# Patient Record
Sex: Female | Born: 1965 | Race: White | Hispanic: No | Marital: Single | Smoking: Never smoker
Health system: Southern US, Community
[De-identification: ages and names within clinical notes are randomized; demographics above are authoritative.]

## PROBLEM LIST (undated history)

## (undated) DIAGNOSIS — E049 Nontoxic goiter, unspecified: Secondary | ICD-10-CM

## (undated) DIAGNOSIS — Z87442 Personal history of urinary calculi: Secondary | ICD-10-CM

## (undated) DIAGNOSIS — M109 Gout, unspecified: Secondary | ICD-10-CM

## (undated) DIAGNOSIS — C50919 Malignant neoplasm of unspecified site of unspecified female breast: Secondary | ICD-10-CM

## (undated) DIAGNOSIS — I5030 Unspecified diastolic (congestive) heart failure: Secondary | ICD-10-CM

## (undated) DIAGNOSIS — I10 Essential (primary) hypertension: Secondary | ICD-10-CM

## (undated) DIAGNOSIS — B029 Zoster without complications: Secondary | ICD-10-CM

## (undated) DIAGNOSIS — D649 Anemia, unspecified: Secondary | ICD-10-CM

## (undated) DIAGNOSIS — E119 Type 2 diabetes mellitus without complications: Secondary | ICD-10-CM

## (undated) DIAGNOSIS — K5790 Diverticulosis of intestine, part unspecified, without perforation or abscess without bleeding: Secondary | ICD-10-CM

## (undated) DIAGNOSIS — J449 Chronic obstructive pulmonary disease, unspecified: Secondary | ICD-10-CM

## (undated) DIAGNOSIS — D3502 Benign neoplasm of left adrenal gland: Secondary | ICD-10-CM

## (undated) DIAGNOSIS — N289 Disorder of kidney and ureter, unspecified: Secondary | ICD-10-CM

## (undated) DIAGNOSIS — N183 Chronic kidney disease, stage 3 unspecified: Secondary | ICD-10-CM

## (undated) DIAGNOSIS — I700 Atherosclerosis of aorta: Secondary | ICD-10-CM

## (undated) DIAGNOSIS — G4733 Obstructive sleep apnea (adult) (pediatric): Secondary | ICD-10-CM

## (undated) DIAGNOSIS — C50211 Malignant neoplasm of upper-inner quadrant of right female breast: Secondary | ICD-10-CM

## (undated) DIAGNOSIS — J984 Other disorders of lung: Secondary | ICD-10-CM

## (undated) DIAGNOSIS — Z923 Personal history of irradiation: Secondary | ICD-10-CM

## (undated) DIAGNOSIS — Z7901 Long term (current) use of anticoagulants: Secondary | ICD-10-CM

## (undated) DIAGNOSIS — E039 Hypothyroidism, unspecified: Secondary | ICD-10-CM

## (undated) DIAGNOSIS — I4819 Other persistent atrial fibrillation: Secondary | ICD-10-CM

## (undated) DIAGNOSIS — Z992 Dependence on renal dialysis: Secondary | ICD-10-CM

## (undated) DIAGNOSIS — M1990 Unspecified osteoarthritis, unspecified site: Secondary | ICD-10-CM

## (undated) DIAGNOSIS — G479 Sleep disorder, unspecified: Secondary | ICD-10-CM

## (undated) DIAGNOSIS — I509 Heart failure, unspecified: Secondary | ICD-10-CM

## (undated) DIAGNOSIS — I2699 Other pulmonary embolism without acute cor pulmonale: Secondary | ICD-10-CM

## (undated) DIAGNOSIS — I4821 Permanent atrial fibrillation: Secondary | ICD-10-CM

## (undated) DIAGNOSIS — N186 End stage renal disease: Secondary | ICD-10-CM

## (undated) DIAGNOSIS — Z780 Asymptomatic menopausal state: Secondary | ICD-10-CM

## (undated) HISTORY — DX: Hypothyroidism, unspecified: E03.9

## (undated) HISTORY — DX: Nontoxic goiter, unspecified: E04.9

## (undated) HISTORY — DX: Other persistent atrial fibrillation: I48.19

## (undated) HISTORY — DX: Disorder of kidney and ureter, unspecified: N28.9

## (undated) HISTORY — DX: Permanent atrial fibrillation: I48.21

## (undated) HISTORY — DX: Zoster without complications: B02.9

## (undated) HISTORY — DX: Anemia, unspecified: D64.9

## (undated) HISTORY — DX: Malignant neoplasm of upper-inner quadrant of right female breast: C50.211

## (undated) HISTORY — DX: Essential (primary) hypertension: I10

## (undated) HISTORY — DX: End stage renal disease: N18.6

## (undated) HISTORY — DX: Asymptomatic menopausal state: Z78.0

## (undated) HISTORY — DX: Gout, unspecified: M10.9

## (undated) HISTORY — DX: Chronic kidney disease, stage 3 unspecified: N18.30

## (undated) HISTORY — DX: Unspecified diastolic (congestive) heart failure: I50.30

## (undated) HISTORY — DX: Unspecified osteoarthritis, unspecified site: M19.90

## (undated) HISTORY — DX: Heart failure, unspecified: I50.9

## (undated) HISTORY — DX: Type 2 diabetes mellitus without complications: E11.9

## (undated) HISTORY — DX: Morbid (severe) obesity due to excess calories: E66.01

---
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
## 2004-11-20 ENCOUNTER — Emergency Department: Payer: Self-pay | Admitting: Unknown Physician Specialty

## 2008-06-21 ENCOUNTER — Emergency Department: Payer: Self-pay | Admitting: Emergency Medicine

## 2008-07-13 ENCOUNTER — Ambulatory Visit: Payer: Self-pay

## 2009-12-05 ENCOUNTER — Emergency Department: Payer: Self-pay | Admitting: Emergency Medicine

## 2012-02-14 DIAGNOSIS — C50919 Malignant neoplasm of unspecified site of unspecified female breast: Secondary | ICD-10-CM

## 2012-02-14 HISTORY — PX: BREAST SURGERY: SHX581

## 2012-02-14 HISTORY — PX: BREAST BIOPSY: SHX20

## 2012-02-14 HISTORY — DX: Malignant neoplasm of unspecified site of unspecified female breast: C50.919

## 2012-07-10 ENCOUNTER — Encounter: Payer: Self-pay | Admitting: General Surgery

## 2012-07-10 ENCOUNTER — Ambulatory Visit (INDEPENDENT_AMBULATORY_CARE_PROVIDER_SITE_OTHER): Payer: BC Managed Care – PPO | Admitting: General Surgery

## 2012-07-10 ENCOUNTER — Other Ambulatory Visit: Payer: Self-pay

## 2012-07-10 VITALS — BP 120/74 | HR 78 | Resp 14 | Ht 64.0 in | Wt 343.0 lb

## 2012-07-10 DIAGNOSIS — N6452 Nipple discharge: Secondary | ICD-10-CM

## 2012-07-10 DIAGNOSIS — N6459 Other signs and symptoms in breast: Secondary | ICD-10-CM

## 2012-07-10 NOTE — Progress Notes (Signed)
Patient ID: Nicole Schmidt, female   DOB: 08-03-1965, 47 y.o.   MRN: [id number]  Chief Complaint  Patient presents with  . Breast Problem    HPI Nicole Schmidt is a 47 y.o. female here today for her right bloody nipple discharge has been going on for three week. Never had any breast problems before. Patient had here first mammogram on 07/04/12. Patient has no family history of breast cancer. Patient dose perform self breast checks .Cytology was performed by Dr. Ronnald Collum and came back negative. HPI  Past Medical History  Diagnosis Date  . Arthritis   . Menopause     age 18  . Hypertension   . Gout   . Thyroid goiter     History reviewed. No pertinent past surgical history.  Family History  Problem Relation Age of Onset  . Diabetes Father   . Hypertension Father   . Hypertension Mother   . Rheum arthritis Mother   . Atrial fibrillation Mother   . Parkinson's disease Father     Social History History  Substance Use Topics  . Smoking status: Never Smoker   . Smokeless tobacco: Never Used  . Alcohol Use: No    No Known Allergies  Current Outpatient Prescriptions  Medication Sig Dispense Refill  . allopurinol (ZYLOPRIM) 300 MG tablet Take 1 tablet by mouth daily.      Marland Kitchen amLODipine (NORVASC) 10 MG tablet Take 1 tablet by mouth daily.      Francia Greaves THYROID 60 MG tablet Take 2 tablets by mouth daily.      . Calcium Carbonate-Vitamin D (CALCIUM 600 + D PO) Take 1 tablet by mouth daily.      . Chromium-Cinnamon [phone] MCG-MG CAPS Take 2 capsules by mouth daily.      . ergocalciferol (VITAMIN D2) 50000 UNITS capsule Take 50,000 Units by mouth once a week.      . folic acid (FOLVITE) Q000111Q MCG tablet Take 400 mcg by mouth daily.      . furosemide (LASIX) 20 MG tablet Take 1 tablet by mouth daily.      Marland Kitchen lisinopril-hydrochlorothiazide (PRINZIDE,ZESTORETIC) 20-25 MG per tablet Take 1 tablet by mouth daily.      . metFORMIN (GLUCOPHAGE) 1000 MG tablet Take 1 tablet by mouth 2 (two)  times daily.      Marland Kitchen VICTOZA 18 MG/3ML SOPN Inject 1.8 mg as directed daily.       No current facility-administered medications for this visit.    Review of Systems Review of Systems  Constitutional: Negative.   Respiratory: Negative.   Cardiovascular: Negative.     Blood pressure 120/74, pulse 78, resp. rate 14, height 5\' 4"  (1.626 m), weight 343 lb (155.584 kg), last menstrual period 02/13/2010.  Physical Exam Physical Exam  Constitutional: She is oriented to person, place, and time. She appears well-developed and well-nourished.  Eyes: Conjunctivae are normal. No scleral icterus.  Neck: Neck supple.  Cardiovascular: Normal rate, regular rhythm and normal heart sounds.   Pulmonary/Chest: Breath sounds normal. Right breast exhibits nipple discharge (bloody discharge ). Right breast exhibits no inverted nipple, no mass, no skin change and no tenderness. Left breast exhibits no inverted nipple, no mass, no nipple discharge, no skin change and no tenderness. Breasts are symmetrical.  Abdominal: Soft.  Lymphadenopathy:    She has no cervical adenopathy.    She has no axillary adenopathy.  Neurological: She is alert and oriented to person, place, and time.  Skin: Skin is warm and dry.  Drainage is coming from the upper inter quadrant  medially   Data Reviewed Mammogram and u/s  from BI reviewed. U/s performed here shows multiple ducts from 12-3 o'clock in right subareolar region.Pressure over this produces discharge.  Assessment   bloody discharge right nipple     Plan    Best option is subareolar excision.    This patient's surgery has been scheduled for 07-19-12 at [facility].   Angellee Cohill G 07/10/2012, 11:35 AM

## 2012-07-10 NOTE — Patient Instructions (Addendum)
Patient to have a right breast subareolar duct excision. This was explained in full to patient.  This patient's surgery has been scheduled for 07-19-12 at [facility].

## 2012-07-19 ENCOUNTER — Ambulatory Visit: Payer: Self-pay | Admitting: General Surgery

## 2012-07-19 DIAGNOSIS — N6459 Other signs and symptoms in breast: Secondary | ICD-10-CM

## 2012-07-19 HISTORY — PX: BREAST EXCISIONAL BIOPSY: SUR124

## 2012-07-23 ENCOUNTER — Encounter: Payer: Self-pay | Admitting: General Surgery

## 2012-07-24 ENCOUNTER — Encounter: Payer: Self-pay | Admitting: General Surgery

## 2012-07-24 ENCOUNTER — Ambulatory Visit (INDEPENDENT_AMBULATORY_CARE_PROVIDER_SITE_OTHER): Payer: BC Managed Care – PPO | Admitting: General Surgery

## 2012-07-24 VITALS — BP 124/82 | HR 92 | Resp 18 | Ht 64.0 in | Wt 338.0 lb

## 2012-07-24 DIAGNOSIS — C50919 Malignant neoplasm of unspecified site of unspecified female breast: Secondary | ICD-10-CM

## 2012-07-24 DIAGNOSIS — C50911 Malignant neoplasm of unspecified site of right female breast: Secondary | ICD-10-CM | POA: Insufficient documentation

## 2012-07-24 NOTE — Patient Instructions (Addendum)
Patient to have a bilateral breast MRI. This is to be arranged.

## 2012-07-24 NOTE — Progress Notes (Signed)
Patient ID: Nicole Schmidt, female   DOB: [birth year], 47 y.o.   MRN: [id number]  Chief Complaint  Patient presents with  . Other    HPI Nicole Schmidt is a 47 y.o. female.  Patient here for discussion of right breast biopsy results done 06-18-12. HPI  Past Medical History  Diagnosis Date  . Arthritis   . Menopause     age 81  . Hypertension   . Gout   . Thyroid goiter     No past surgical history on file.  Family History  Problem Relation Age of Onset  . Diabetes Father   . Hypertension Father   . Hypertension Mother   . Rheum arthritis Mother   . Atrial fibrillation Mother   . Parkinson's disease Father     Social History History  Substance Use Topics  . Smoking status: Never Smoker   . Smokeless tobacco: Never Used  . Alcohol Use: No    No Known Allergies  Current Outpatient Prescriptions  Medication Sig Dispense Refill  . allopurinol (ZYLOPRIM) 300 MG tablet Take 1 tablet by mouth daily.      Marland Kitchen amLODipine (NORVASC) 10 MG tablet Take 1 tablet by mouth daily.      Francia Greaves THYROID 60 MG tablet Take 2 tablets by mouth daily.      . Calcium Carbonate-Vitamin D (CALCIUM 600 + D PO) Take 1 tablet by mouth daily.      . Chromium-Cinnamon [phone] MCG-MG CAPS Take 2 capsules by mouth daily.      . ergocalciferol (VITAMIN D2) 50000 UNITS capsule Take 50,000 Units by mouth once a week.      . folic acid (FOLVITE) Q000111Q MCG tablet Take 400 mcg by mouth daily.      . furosemide (LASIX) 20 MG tablet Take 1 tablet by mouth daily.      Marland Kitchen lisinopril-hydrochlorothiazide (PRINZIDE,ZESTORETIC) 20-25 MG per tablet Take 1 tablet by mouth daily.      . metFORMIN (GLUCOPHAGE) 1000 MG tablet Take 1 tablet by mouth 2 (two) times daily.      Marland Kitchen VICTOZA 18 MG/3ML SOPN Inject 1.8 mg as directed daily.       No current facility-administered medications for this visit.    Review of Systems Review of Systems  Constitutional: Negative.   Respiratory: Negative.   Cardiovascular: Negative.      Blood pressure 124/82, pulse 92, resp. rate 18, height 5\' 4"  (1.626 m), weight 338 lb (153.316 kg), last menstrual period 07/17/2012.  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 Margins reported clear.  Assessment    At surgery pt had multiple ducts filled with old blood. Given the extensive nature of DCIS would like pt to have MRI before treatment planning. ER/PR,Her 2 neu are pending.      Plan    Discussed pathology findings in full and roles of ER/PR, Her 2 neu and possible MRI findings discussed. Also briefly discussed roles of sentnel node biopsy, radiation and possible need for chemotherapy.   Patient to have a bilateral breast MRI. This is to be arranged.     Karie Fetch M 07/24/2012, 4:33 PM

## 2012-07-28 ENCOUNTER — Ambulatory Visit
Admission: RE | Admit: 2012-07-28 | Discharge: 2012-07-28 | Disposition: A | Discharge status: Home / Self Care | Payer: BC Managed Care – PPO | Source: Ambulatory Visit | Attending: General Surgery | Admitting: General Surgery

## 2012-07-28 MED ORDER — GADOBENATE DIMEGLUMINE 529 MG/ML IV SOLN
10.0000 mL | Freq: Once | INTRAVENOUS | Status: AC | PRN
  Administered 2012-07-28: 10 mL via INTRAVENOUS

## 2012-07-29 ENCOUNTER — Telehealth: Payer: Self-pay | Admitting: *Deleted

## 2012-07-29 NOTE — Telephone Encounter (Signed)
Patient's surgery has been scheduled for 08-09-12 at [facility]. She is aware of instructions.

## 2012-07-30 LAB — PATHOLOGY REPORT

## 2012-07-31 ENCOUNTER — Other Ambulatory Visit: Payer: Self-pay | Admitting: General Surgery

## 2012-07-31 ENCOUNTER — Encounter: Payer: Self-pay | Admitting: General Surgery

## 2012-07-31 DIAGNOSIS — C50919 Malignant neoplasm of unspecified site of unspecified female breast: Secondary | ICD-10-CM

## 2012-07-31 NOTE — Progress Notes (Signed)
Patient ID: Nicole Schmidt, female   DOB: 11-23-1965, 47 y.o.   MRN: [id number] MRI of breast showed some increased enhancement of the anterior/lateral aspect of lumpectomy cavity in right breast- this is eother post op changes or residual cancer. No other findings in either breast. Pt was advised by phone . Feel it is reasonable to reexcise the lumpectomy site at time of sentinel node biopsy. Patient advised on this and is agreeable.  ER/PR are positive, her 2 is still pending.

## 2012-08-01 ENCOUNTER — Telehealth: Payer: Self-pay | Admitting: *Deleted

## 2012-08-01 ENCOUNTER — Ambulatory Visit: Payer: BC Managed Care – PPO | Admitting: General Surgery

## 2012-08-01 NOTE — Telephone Encounter (Signed)
Patient advised by Dr Jamal Collin, ER/PR positive and HER2 negative

## 2012-08-05 ENCOUNTER — Telehealth: Payer: Self-pay | Admitting: *Deleted

## 2012-08-05 DIAGNOSIS — C50919 Malignant neoplasm of unspecified site of unspecified female breast: Secondary | ICD-10-CM

## 2012-08-05 NOTE — Telephone Encounter (Signed)
Patient's surgery has been rescheduled from 08-09-12 to 08-12-12 per Dr. Angie Fava request. Beverlee Nims in the O.R. and Santiago Glad in scheduling notified of this. Patient is also aware.   We have scheduled patient for an appointment to meet with Dr. Noreene Filbert at the [facility] for 08-07-12 at 9 am. This patient is aware of date, time, and instructions.

## 2012-08-06 ENCOUNTER — Encounter: Payer: Self-pay | Admitting: General Surgery

## 2012-08-06 NOTE — Addendum Note (Signed)
Addended by: Georgana Curio on: 08/06/2012 05:15 PM   Modules accepted: Orders

## 2012-08-07 ENCOUNTER — Ambulatory Visit: Payer: Self-pay | Admitting: Radiation Oncology

## 2012-08-12 ENCOUNTER — Encounter: Payer: Self-pay | Admitting: General Surgery

## 2012-08-12 ENCOUNTER — Ambulatory Visit: Payer: Self-pay | Admitting: General Surgery

## 2012-08-12 DIAGNOSIS — C50119 Malignant neoplasm of central portion of unspecified female breast: Secondary | ICD-10-CM

## 2012-08-13 ENCOUNTER — Encounter: Payer: Self-pay | Admitting: General Surgery

## 2012-08-13 ENCOUNTER — Ambulatory Visit: Payer: Self-pay | Admitting: Radiation Oncology

## 2012-08-13 LAB — PATHOLOGY REPORT

## 2012-08-15 ENCOUNTER — Telehealth: Payer: Self-pay | Admitting: *Deleted

## 2012-08-15 ENCOUNTER — Encounter: Payer: Self-pay | Admitting: General Surgery

## 2012-08-15 NOTE — Telephone Encounter (Signed)
Notified patient as instructed benign lymph node from Monday per Dr Jamal Collin, patient pleased. Discussed follow-up appointments, patient agrees

## 2012-08-19 ENCOUNTER — Encounter: Payer: Self-pay | Admitting: General Surgery

## 2012-08-19 ENCOUNTER — Ambulatory Visit (INDEPENDENT_AMBULATORY_CARE_PROVIDER_SITE_OTHER): Payer: BC Managed Care – PPO | Admitting: General Surgery

## 2012-08-19 VITALS — BP 132/70 | HR 80 | Resp 14 | Ht 64.0 in | Wt 345.0 lb

## 2012-08-19 DIAGNOSIS — C50919 Malignant neoplasm of unspecified site of unspecified female breast: Secondary | ICD-10-CM

## 2012-08-19 DIAGNOSIS — L089 Local infection of the skin and subcutaneous tissue, unspecified: Secondary | ICD-10-CM

## 2012-08-19 MED ORDER — CEFADROXIL 500 MG PO CAPS: 500.0000 mg | ORAL_CAPSULE | Freq: Two times a day (BID) | ORAL | Status: DC

## 2012-08-19 NOTE — Patient Instructions (Addendum)
Complete course of antibiotics, call for any problems.

## 2012-08-19 NOTE — Progress Notes (Signed)
Patient ID: Nicole Schmidt, female   DOB: 1965-12-10, 47 y.o.   MRN: [id number] Here for post sentinel node biopsy and lumpectomy. Patient reports some post operative soreness in the right axilla. Exam shows axillary incision is healing well. However mild skin redness is noted in periareolar region. Possible related to injections for sentinel node. To be safe will treat her with Duricef 500mg  BID for 1 week. Radiation therapy to start in 2 weeks.

## 2012-09-13 ENCOUNTER — Ambulatory Visit: Payer: Self-pay | Admitting: Radiation Oncology

## 2012-09-18 LAB — CBC CANCER CENTER
Basophil #: 0.1 x10 3/mm (ref 0.0–0.1)
Basophil %: 0.7 %
Eosinophil #: 0.2 x10 3/mm (ref 0.0–0.7)
Eosinophil %: 2.1 %
HCT: 33.6 % — ABNORMAL LOW (ref 35.0–47.0)
HGB: 10.9 g/dL — ABNORMAL LOW (ref 12.0–16.0)
Lymphocyte #: 1.1 x10 3/mm (ref 1.0–3.6)
Lymphocyte %: 10.6 %
MCH: 28.1 pg (ref 26.0–34.0)
MCHC: 32.4 g/dL (ref 32.0–36.0)
MCV: 87 fL (ref 80–100)
Monocyte #: 0.9 x10 3/mm (ref 0.2–0.9)
Monocyte %: 8.5 %
Neutrophil #: 8.2 x10 3/mm — ABNORMAL HIGH (ref 1.4–6.5)
Neutrophil %: 78.1 %
Platelet: 242 x10 3/mm (ref 150–440)
RBC: 3.88 10*6/uL (ref 3.80–5.20)
RDW: 15.8 % — ABNORMAL HIGH (ref 11.5–14.5)
WBC: 10.5 x10 3/mm (ref 3.6–11.0)

## 2012-09-25 LAB — CBC CANCER CENTER
Basophil #: 0 x10 3/mm (ref 0.0–0.1)
Basophil %: 0.4 %
Eosinophil #: 0.2 x10 3/mm (ref 0.0–0.7)
Eosinophil %: 1.6 %
HCT: 33.2 % — ABNORMAL LOW (ref 35.0–47.0)
HGB: 10.8 g/dL — ABNORMAL LOW (ref 12.0–16.0)
Lymphocyte #: 1 x10 3/mm (ref 1.0–3.6)
Lymphocyte %: 9.7 %
MCH: 27.9 pg (ref 26.0–34.0)
MCHC: 32.5 g/dL (ref 32.0–36.0)
MCV: 86 fL (ref 80–100)
Monocyte #: 0.9 x10 3/mm (ref 0.2–0.9)
Monocyte %: 8.6 %
Neutrophil #: 8.3 x10 3/mm — ABNORMAL HIGH (ref 1.4–6.5)
Neutrophil %: 79.7 %
Platelet: 237 x10 3/mm (ref 150–440)
RBC: 3.87 10*6/uL (ref 3.80–5.20)
RDW: 15.7 % — ABNORMAL HIGH (ref 11.5–14.5)
WBC: 10.5 x10 3/mm (ref 3.6–11.0)

## 2012-10-09 LAB — CBC CANCER CENTER
Basophil #: 0 x10 3/mm (ref 0.0–0.1)
Basophil %: 0.4 %
Eosinophil #: 0.3 x10 3/mm (ref 0.0–0.7)
Eosinophil %: 2.2 %
HCT: 33.9 % — ABNORMAL LOW (ref 35.0–47.0)
HGB: 11.1 g/dL — ABNORMAL LOW (ref 12.0–16.0)
Lymphocyte #: 0.8 x10 3/mm — ABNORMAL LOW (ref 1.0–3.6)
Lymphocyte %: 6.8 %
MCH: 28 pg (ref 26.0–34.0)
MCHC: 32.8 g/dL (ref 32.0–36.0)
MCV: 85 fL (ref 80–100)
Monocyte #: 1.1 x10 3/mm — ABNORMAL HIGH (ref 0.2–0.9)
Monocyte %: 9.7 %
Neutrophil #: 9.2 x10 3/mm — ABNORMAL HIGH (ref 1.4–6.5)
Neutrophil %: 80.9 %
Platelet: 207 x10 3/mm (ref 150–440)
RBC: 3.98 10*6/uL (ref 3.80–5.20)
RDW: 15.7 % — ABNORMAL HIGH (ref 11.5–14.5)
WBC: 11.4 x10 3/mm — ABNORMAL HIGH (ref 3.6–11.0)

## 2012-10-14 ENCOUNTER — Ambulatory Visit: Payer: Self-pay | Admitting: Radiation Oncology

## 2012-11-05 ENCOUNTER — Ambulatory Visit (INDEPENDENT_AMBULATORY_CARE_PROVIDER_SITE_OTHER): Payer: BC Managed Care – PPO | Admitting: General Surgery

## 2012-11-05 ENCOUNTER — Encounter: Payer: Self-pay | Admitting: General Surgery

## 2012-11-05 VITALS — BP 124/64 | HR 82 | Resp 16 | Ht 64.0 in | Wt 344.0 lb

## 2012-11-05 DIAGNOSIS — C50919 Malignant neoplasm of unspecified site of unspecified female breast: Secondary | ICD-10-CM

## 2012-11-05 NOTE — Progress Notes (Signed)
Patient ID: Nicole Schmidt, female   DOB: [birth year], 47 y.o.   MRN: [id number]  Chief Complaint  Patient presents with  . Follow-up    2 month follwo up breast cancer    HPI Nicole Schmidt is a 47 y.o. female who presents for a 2 month follow up of right breast cancer. The patient denies any new problems at this time. She has completed radiation on 10/21/12.   HPI  Past Medical History  Diagnosis Date  . Arthritis   . Menopause     age 65  . Hypertension   . Gout   . Thyroid goiter     Past Surgical History  Procedure Laterality Date  . Breast surgery Right 2014    with sentinel node bx subareaolar duct excision  . Breast biopsy Right 2014    Family History  Problem Relation Age of Onset  . Diabetes Father   . Hypertension Father   . Hypertension Mother   . Rheum arthritis Mother   . Atrial fibrillation Mother   . Parkinson's disease Father     Social History History  Substance Use Topics  . Smoking status: Never Smoker   . Smokeless tobacco: Never Used  . Alcohol Use: No    No Known Allergies  Current Outpatient Prescriptions  Medication Sig Dispense Refill  . allopurinol (ZYLOPRIM) 300 MG tablet Take 1 tablet by mouth daily.      Marland Kitchen amLODipine (NORVASC) 10 MG tablet Take 1 tablet by mouth daily.      Francia Greaves THYROID 60 MG tablet Take 2 tablets by mouth daily.      . bromocriptine (PARLODEL) 2.5 MG tablet Take 0.5 tablets by mouth daily.       . Calcium Carbonate-Vitamin D (CALCIUM 600 + D PO) Take 1 tablet by mouth daily.      . Chromium-Cinnamon [phone] MCG-MG CAPS Take 2 capsules by mouth daily.      . ergocalciferol (VITAMIN D2) 50000 UNITS capsule Take 50,000 Units by mouth once a week.      . folic acid (FOLVITE) Q000111Q MCG tablet Take 400 mcg by mouth daily.      . furosemide (LASIX) 20 MG tablet Take 1 tablet by mouth daily.      Marland Kitchen lisinopril-hydrochlorothiazide (PRINZIDE,ZESTORETIC) 20-25 MG per tablet Take 1 tablet by mouth daily.      . metFORMIN  (GLUCOPHAGE) 1000 MG tablet Take 1 tablet by mouth 2 (two) times daily.      . silver sulfADIAZINE (SILVADENE) 1 % cream 1 application as needed.      . traMADol (ULTRAM) 50 MG tablet Take 1 tablet by mouth as needed.      Marland Kitchen VICTOZA 18 MG/3ML SOPN Inject 1.8 mg as directed daily.       No current facility-administered medications for this visit.    Review of Systems Review of Systems  Constitutional: Negative.   Respiratory: Negative.   Cardiovascular: Negative.     Blood pressure 124/64, pulse 82, resp. rate 16, height 5\' 4"  (1.626 m), weight 344 lb (156.037 kg), last menstrual period 07/17/2012.  Physical Exam Physical Exam  Constitutional: She appears well-developed and well-nourished.  Eyes: Conjunctivae are normal. No scleral icterus.  Neck: No thyromegaly present.  Pulmonary/Chest: Right breast exhibits no inverted nipple, no mass, no nipple discharge, no skin change and no tenderness. Left breast exhibits no inverted nipple, no mass, no nipple discharge, no skin change and no tenderness.  Well healed incision site of the right  breast. Showing some post radiation skin changes.    Lymphadenopathy:    She has no cervical adenopathy.    She has no axillary adenopathy.    Data Reviewed Prior path  Assessment    Right breast invasive CA and DCIS , T1,N0,M0. Er/PR pos, her 2 negative. Will start antihormonal therapy.     Plan    Patient to return in January 2015 with right diagnostic mammogram.        Junie Panning G 11/05/2012, 7:15 PM

## 2012-11-05 NOTE — Patient Instructions (Addendum)
Patient advised to continue self breast exams and contact our office with any new problems. Patient to return in January 2015 with right diagnostic mammogram.

## 2012-11-13 ENCOUNTER — Ambulatory Visit: Payer: Self-pay | Admitting: Radiation Oncology

## 2012-12-05 LAB — COMPREHENSIVE METABOLIC PANEL
Albumin: 3.4 g/dL (ref 3.4–5.0)
Alkaline Phosphatase: 165 U/L — ABNORMAL HIGH (ref 50–136)
Anion Gap: 8 (ref 7–16)
BUN: 28 mg/dL — ABNORMAL HIGH (ref 7–18)
Bilirubin,Total: 0.2 mg/dL (ref 0.2–1.0)
Calcium, Total: 8.7 mg/dL (ref 8.5–10.1)
Chloride: 104 mmol/L (ref 98–107)
Co2: 30 mmol/L (ref 21–32)
Creatinine: 1.12 mg/dL (ref 0.60–1.30)
EGFR (African American): >60
EGFR (Non-African Amer.): 58 — ABNORMAL LOW
Glucose: 102 mg/dL — ABNORMAL HIGH (ref 65–99)
Osmolality: 289 (ref 275–301)
Potassium: 4.4 mmol/L (ref 3.5–5.1)
SGOT(AST): 17 U/L (ref 15–37)
SGPT (ALT): 27 U/L (ref 12–78)
Sodium: 142 mmol/L (ref 136–145)
Total Protein: 7.8 g/dL (ref 6.4–8.2)

## 2012-12-05 LAB — CBC CANCER CENTER
Basophil #: 0.1 x10 3/mm (ref 0.0–0.1)
Basophil %: 0.5 %
Eosinophil #: 0.2 x10 3/mm (ref 0.0–0.7)
Eosinophil %: 1.9 %
HCT: 36 % (ref 35.0–47.0)
HGB: 11.4 g/dL — ABNORMAL LOW (ref 12.0–16.0)
Lymphocyte #: 1.2 x10 3/mm (ref 1.0–3.6)
Lymphocyte %: 11 %
MCH: 26.6 pg (ref 26.0–34.0)
MCHC: 31.8 g/dL — ABNORMAL LOW (ref 32.0–36.0)
MCV: 84 fL (ref 80–100)
Monocyte #: 0.8 x10 3/mm (ref 0.2–0.9)
Monocyte %: 7.1 %
Neutrophil #: 8.7 x10 3/mm — ABNORMAL HIGH (ref 1.4–6.5)
Neutrophil %: 79.5 %
Platelet: 245 x10 3/mm (ref 150–440)
RBC: 4.29 10*6/uL (ref 3.80–5.20)
RDW: 16.3 % — ABNORMAL HIGH (ref 11.5–14.5)
WBC: 11 x10 3/mm (ref 3.6–11.0)

## 2012-12-14 ENCOUNTER — Ambulatory Visit: Payer: Self-pay | Admitting: Radiation Oncology

## 2013-01-07 LAB — COMPREHENSIVE METABOLIC PANEL
Albumin: 3.3 g/dL — ABNORMAL LOW (ref 3.4–5.0)
Alkaline Phosphatase: 161 U/L — ABNORMAL HIGH
Anion Gap: 8 (ref 7–16)
BUN: 42 mg/dL — ABNORMAL HIGH (ref 7–18)
Bilirubin,Total: 0.2 mg/dL (ref 0.2–1.0)
Calcium, Total: 9.3 mg/dL (ref 8.5–10.1)
Chloride: 102 mmol/L (ref 98–107)
Co2: 31 mmol/L (ref 21–32)
Creatinine: 1.42 mg/dL — ABNORMAL HIGH (ref 0.60–1.30)
EGFR (African American): 50 — ABNORMAL LOW
EGFR (Non-African Amer.): 43 — ABNORMAL LOW
Glucose: 102 mg/dL — ABNORMAL HIGH (ref 65–99)
Osmolality: 292 (ref 275–301)
Potassium: 4.4 mmol/L (ref 3.5–5.1)
SGOT(AST): 21 U/L (ref 15–37)
SGPT (ALT): 30 U/L (ref 12–78)
Sodium: 141 mmol/L (ref 136–145)
Total Protein: 7.8 g/dL (ref 6.4–8.2)

## 2013-01-07 LAB — CBC CANCER CENTER
Basophil #: 0.1 x10 3/mm (ref 0.0–0.1)
Basophil %: 0.8 %
Eosinophil #: 0.2 x10 3/mm (ref 0.0–0.7)
Eosinophil %: 2.2 %
HCT: 35.8 % (ref 35.0–47.0)
HGB: 11 g/dL — ABNORMAL LOW (ref 12.0–16.0)
Lymphocyte #: 1.6 x10 3/mm (ref 1.0–3.6)
Lymphocyte %: 14.3 %
MCH: 25.6 pg — ABNORMAL LOW (ref 26.0–34.0)
MCHC: 30.8 g/dL — ABNORMAL LOW (ref 32.0–36.0)
MCV: 83 fL (ref 80–100)
Monocyte #: 0.8 x10 3/mm (ref 0.2–0.9)
Monocyte %: 7.2 %
Neutrophil #: 8.4 x10 3/mm — ABNORMAL HIGH (ref 1.4–6.5)
Neutrophil %: 75.5 %
Platelet: 238 x10 3/mm (ref 150–440)
RBC: 4.3 10*6/uL (ref 3.80–5.20)
RDW: 16.2 % — ABNORMAL HIGH (ref 11.5–14.5)
WBC: 11.1 x10 3/mm — ABNORMAL HIGH (ref 3.6–11.0)

## 2013-01-13 ENCOUNTER — Ambulatory Visit: Payer: Self-pay | Admitting: Radiation Oncology

## 2013-03-03 ENCOUNTER — Encounter: Payer: Self-pay | Admitting: General Surgery

## 2013-03-13 ENCOUNTER — Encounter: Payer: Self-pay | Admitting: General Surgery

## 2013-03-13 ENCOUNTER — Ambulatory Visit (INDEPENDENT_AMBULATORY_CARE_PROVIDER_SITE_OTHER): Payer: BC Managed Care – PPO | Admitting: General Surgery

## 2013-03-13 VITALS — BP 104/60 | HR 70 | Resp 12 | Ht 64.0 in | Wt 333.0 lb

## 2013-03-13 DIAGNOSIS — C50919 Malignant neoplasm of unspecified site of unspecified female breast: Secondary | ICD-10-CM

## 2013-03-13 NOTE — Progress Notes (Signed)
Patient ID: Nicole Schmidt, female   DOB: [birth year], 48 y.o.   MRN: [id number]  Chief Complaint  Patient presents with  . Follow-up    mammogram    HPI Nicole Schmidt is a 48 y.o. female who presents for a breast evaluation. The most recent mammogram was done on 02/28/13 at [facility]. Patient does perform regular self breast checks and gets regular mammograms done.  Recovering from a sinus infection. No new breast issues.  HPI  Past Medical History  Diagnosis Date  . Arthritis   . Menopause     age 30  . Hypertension   . Gout   . Thyroid goiter     Past Surgical History  Procedure Laterality Date  . Breast surgery Right 2014    with sentinel node bx subareaolar duct excision  . Breast biopsy Right 2014    Family History  Problem Relation Age of Onset  . Diabetes Father   . Hypertension Father   . Hypertension Mother   . Rheum arthritis Mother   . Atrial fibrillation Mother   . Parkinson's disease Father     Social History History  Substance Use Topics  . Smoking status: Never Smoker   . Smokeless tobacco: Never Used  . Alcohol Use: No    No Known Allergies  Current Outpatient Prescriptions  Medication Sig Dispense Refill  . allopurinol (ZYLOPRIM) 300 MG tablet Take 1 tablet by mouth daily.      Marland Kitchen amLODipine (NORVASC) 10 MG tablet Take 1 tablet by mouth daily.      Marland Kitchen amoxicillin-clavulanate (AUGMENTIN) 875-125 MG per tablet Take 1 tablet by mouth 2 (two) times daily.      Francia Greaves THYROID 60 MG tablet Take 2 tablets by mouth daily.      . Calcium Carbonate-Vitamin D (CALCIUM 600 + D PO) Take 1 tablet by mouth daily.      . Chromium-Cinnamon [phone] MCG-MG CAPS Take 2 capsules by mouth daily.      . ergocalciferol (VITAMIN D2) 50000 UNITS capsule Take 50,000 Units by mouth once a week.      . fluticasone (FLONASE) 50 MCG/ACT nasal spray       . folic acid (FOLVITE) Q000111Q MCG tablet Take 400 mcg by mouth daily.      . furosemide (LASIX) 20 MG tablet Take 1 tablet by mouth  daily.      Marland Kitchen lisinopril-hydrochlorothiazide (PRINZIDE,ZESTORETIC) 20-25 MG per tablet Take 1 tablet by mouth daily.      . metFORMIN (GLUCOPHAGE) 1000 MG tablet Take 1 tablet by mouth 2 (two) times daily.      . silver sulfADIAZINE (SILVADENE) 1 % cream 1 application as needed.      . tamoxifen (NOLVADEX) 20 MG tablet Take 1 tablet by mouth daily.      . traMADol (ULTRAM) 50 MG tablet Take 1 tablet by mouth as needed.      Marland Kitchen VICTOZA 18 MG/3ML SOPN Inject 1.8 mg as directed daily.       No current facility-administered medications for this visit.    Review of Systems Review of Systems  Constitutional: Negative.   Respiratory: Negative.   Cardiovascular: Negative.     Blood pressure 104/60, pulse 70, resp. rate 12, height 5\' 4"  (1.626 m), weight 333 lb (151.048 kg), last menstrual period 07/17/2012.  Physical Exam Physical Exam  Constitutional: She is oriented to person, place, and time. She appears well-developed and well-nourished.  Eyes: No scleral icterus.  Neck: Neck supple. No mass  present.  Cardiovascular: Normal rate, regular rhythm and normal heart sounds.   Pulmonary/Chest: Effort normal and breath sounds normal. Right breast exhibits skin change. Right breast exhibits no inverted nipple, no mass, no nipple discharge and no tenderness. Left breast exhibits no inverted nipple, no mass, no nipple discharge, no skin change and no tenderness.  Right breast with some radiation changes in skin.   Lymphadenopathy:    She has no cervical adenopathy.    She has no axillary adenopathy.  Neurological: She is alert and oriented to person, place, and time.  Skin: Skin is warm and dry.    Data Reviewed Mammogram reviewed and stable.   Assessment    Stable. Right breast invasive CA and DCIS, ER/PR pos and her 2 neg. Currently on Tamoxifen.    Plan    Patient to return 6 month bilateral diagnostic mammogram and office visit.        Nicole Schmidt 03/17/2013, 8:03  AM

## 2013-03-13 NOTE — Patient Instructions (Signed)
Continue self breast exams. Call office for any new breast issues or concerns. 

## 2013-03-17 ENCOUNTER — Encounter: Payer: Self-pay | Admitting: General Surgery

## 2013-04-23 ENCOUNTER — Ambulatory Visit: Payer: Self-pay | Admitting: Hematology and Oncology

## 2013-04-25 LAB — CANCER ANTIGEN 27.29: CA 27.29: 21.7 U/mL (ref 0.0–38.6)

## 2013-05-14 ENCOUNTER — Ambulatory Visit: Payer: Self-pay | Admitting: Hematology and Oncology

## 2013-07-24 ENCOUNTER — Ambulatory Visit: Payer: Self-pay | Admitting: Hematology and Oncology

## 2013-07-31 LAB — CBC CANCER CENTER
Basophil #: 0.1 x10 3/mm (ref 0.0–0.1)
Basophil %: 0.6 %
Eosinophil #: 0.3 x10 3/mm (ref 0.0–0.7)
Eosinophil %: 2.5 %
HCT: 36.8 % (ref 35.0–47.0)
HGB: 11.6 g/dL — ABNORMAL LOW (ref 12.0–16.0)
Lymphocyte #: 1.5 x10 3/mm (ref 1.0–3.6)
Lymphocyte %: 14.4 %
MCH: 27.8 pg (ref 26.0–34.0)
MCHC: 31.6 g/dL — ABNORMAL LOW (ref 32.0–36.0)
MCV: 88 fL (ref 80–100)
Monocyte #: 0.6 x10 3/mm (ref 0.2–0.9)
Monocyte %: 6.1 %
Neutrophil #: 8 x10 3/mm — ABNORMAL HIGH (ref 1.4–6.5)
Neutrophil %: 76.4 %
Platelet: 203 x10 3/mm (ref 150–440)
RBC: 4.19 10*6/uL (ref 3.80–5.20)
RDW: 15.8 % — ABNORMAL HIGH (ref 11.5–14.5)
WBC: 10.5 x10 3/mm (ref 3.6–11.0)

## 2013-07-31 LAB — COMPREHENSIVE METABOLIC PANEL
Albumin: 3.3 g/dL — ABNORMAL LOW (ref 3.4–5.0)
Alkaline Phosphatase: 140 U/L — ABNORMAL HIGH
Anion Gap: 10 (ref 7–16)
BUN: 39 mg/dL — ABNORMAL HIGH (ref 7–18)
Bilirubin,Total: 0.2 mg/dL (ref 0.2–1.0)
Calcium, Total: 9.5 mg/dL (ref 8.5–10.1)
Chloride: 104 mmol/L (ref 98–107)
Co2: 29 mmol/L (ref 21–32)
Creatinine: 1.17 mg/dL (ref 0.60–1.30)
EGFR (African American): >60
EGFR (Non-African Amer.): 55 — ABNORMAL LOW
Glucose: 129 mg/dL — ABNORMAL HIGH (ref 65–99)
Osmolality: 296 (ref 275–301)
Potassium: 4.2 mmol/L (ref 3.5–5.1)
SGOT(AST): 20 U/L (ref 15–37)
SGPT (ALT): 29 U/L (ref 12–78)
Sodium: 143 mmol/L (ref 136–145)
Total Protein: 7.4 g/dL (ref 6.4–8.2)

## 2013-08-01 LAB — CANCER ANTIGEN 27.29: CA 27.29: 21.4 U/mL (ref 0.0–38.6)

## 2013-08-05 ENCOUNTER — Telehealth: Payer: Self-pay | Admitting: *Deleted

## 2013-08-05 NOTE — Telephone Encounter (Signed)
Ask if RX for tramadol is needed. Surgery was August 12 2012.

## 2013-08-07 NOTE — Telephone Encounter (Signed)
She gets the renewed by Dr Kallie Edward.

## 2013-08-13 ENCOUNTER — Ambulatory Visit: Payer: Self-pay | Admitting: Hematology and Oncology

## 2013-09-03 ENCOUNTER — Encounter: Payer: Self-pay | Admitting: General Surgery

## 2013-09-23 ENCOUNTER — Ambulatory Visit: Payer: Self-pay | Admitting: Radiation Oncology

## 2013-09-23 ENCOUNTER — Ambulatory Visit (INDEPENDENT_AMBULATORY_CARE_PROVIDER_SITE_OTHER): Payer: BC Managed Care – PPO | Admitting: General Surgery

## 2013-09-23 ENCOUNTER — Encounter: Payer: Self-pay | Admitting: General Surgery

## 2013-09-23 VITALS — BP 130/74 | HR 78 | Resp 14 | Ht 64.0 in | Wt 334.0 lb

## 2013-09-23 DIAGNOSIS — C50919 Malignant neoplasm of unspecified site of unspecified female breast: Secondary | ICD-10-CM

## 2013-09-23 NOTE — Patient Instructions (Addendum)
The patient has been asked to return to the office in 6 month with a right diagnostic mammogram.

## 2013-09-23 NOTE — Progress Notes (Signed)
Patient ID: Nicole Schmidt, female   DOB: [birth year], 48 y.o.   MRN: [id number]  Chief Complaint  Patient presents with  . Follow-up    mammogram    HPI Nicole Schmidt is a 48 y.o. female who presents for breast cancer follow up. The most recent mammogram was done on 09/02/13 at [facility].  Patient does perform regular self breast checks and gets regular mammograms done.    HPI  Past Medical History  Diagnosis Date  . Arthritis   . Menopause     age 10  . Hypertension   . Gout   . Thyroid goiter     Past Surgical History  Procedure Laterality Date  . Breast surgery Right 2014    with sentinel node bx subareaolar duct excision  . Breast biopsy Right 2014    Family History  Problem Relation Age of Onset  . Diabetes Father   . Hypertension Father   . Hypertension Mother   . Rheum arthritis Mother   . Atrial fibrillation Mother   . Parkinson's disease Father     Social History History  Substance Use Topics  . Smoking status: Never Smoker   . Smokeless tobacco: Never Used  . Alcohol Use: No    No Known Allergies  Current Outpatient Prescriptions  Medication Sig Dispense Refill  . allopurinol (ZYLOPRIM) 300 MG tablet Take 1 tablet by mouth daily.      Marland Kitchen amLODipine (NORVASC) 10 MG tablet Take 1 tablet by mouth daily.      Marland Kitchen amoxicillin-clavulanate (AUGMENTIN) 875-125 MG per tablet Take 1 tablet by mouth 2 (two) times daily.      Francia Greaves THYROID 60 MG tablet Take 2 tablets by mouth daily.      . Calcium Carbonate-Vitamin D (CALCIUM 600 + D PO) Take 1 tablet by mouth daily.      . Chromium-Cinnamon [phone] MCG-MG CAPS Take 2 capsules by mouth daily.      . ergocalciferol (VITAMIN D2) 50000 UNITS capsule Take 50,000 Units by mouth once a week.      . fluticasone (FLONASE) 50 MCG/ACT nasal spray       . folic acid (FOLVITE) Q000111Q MCG tablet Take 400 mcg by mouth daily.      . furosemide (LASIX) 20 MG tablet Take 1 tablet by mouth daily.      Marland Kitchen lisinopril-hydrochlorothiazide  (PRINZIDE,ZESTORETIC) 20-25 MG per tablet Take 1 tablet by mouth daily.      . metFORMIN (GLUCOPHAGE) 1000 MG tablet Take 1 tablet by mouth 2 (two) times daily.      . silver sulfADIAZINE (SILVADENE) 1 % cream 1 application as needed.      . tamoxifen (NOLVADEX) 20 MG tablet Take 1 tablet by mouth daily.      . traMADol (ULTRAM) 50 MG tablet Take 1 tablet by mouth as needed.      Marland Kitchen VICTOZA 18 MG/3ML SOPN Inject 1.8 mg as directed daily.       No current facility-administered medications for this visit.    Review of Systems Review of Systems  Constitutional: Negative.   Respiratory: Negative.   Cardiovascular: Negative.     Blood pressure 130/74, pulse 78, resp. rate 14, height 5\' 4"  (1.626 m), weight 334 lb (151.501 kg), last menstrual period 07/17/2012.  Physical Exam Physical Exam  Constitutional: She is oriented to person, place, and time. She appears well-nourished.  Eyes: Conjunctivae are normal. No scleral icterus.  Neck: Neck supple. No mass and no thyromegaly  present.  Cardiovascular: Normal rate, regular rhythm and normal heart sounds.   Pulmonary/Chest: Effort normal and breath sounds normal.  Mild skin thickening in the right breast from radiation.   Abdominal: Soft. Bowel sounds are normal. There is no tenderness.  Lymphadenopathy:    She has no cervical adenopathy.    She has no axillary adenopathy.  Neurological: She is alert and oriented to person, place, and time.  Skin: Skin is warm and dry.    Data Reviewed Mammogram reviewed and stable.  Assessment    Stable exam. 1 yr post right breast lumpectomy sn biopsy and radiation. T1,N0 M0 invasive right breast CA ER/PR pos, her 2 neg. Pt is now on Tamoxifen.      Plan   Patient has not had a period in last 1 yr. Will have oncology eval for possible switch from Tamoxifen to an aromatase inhibitor. The patient has been asked to return to the office in 6 month with a right diagnostic mammogram.          Rockey Guarino G 09/24/2013, 5:55 AM

## 2013-09-24 ENCOUNTER — Ambulatory Visit: Payer: BC Managed Care – PPO | Admitting: General Surgery

## 2013-09-24 ENCOUNTER — Encounter: Payer: Self-pay | Admitting: General Surgery

## 2013-10-14 ENCOUNTER — Ambulatory Visit: Payer: Self-pay | Admitting: Radiation Oncology

## 2013-12-15 ENCOUNTER — Encounter: Payer: Self-pay | Admitting: General Surgery

## 2014-02-12 ENCOUNTER — Emergency Department: Payer: Self-pay | Admitting: Student

## 2014-03-09 ENCOUNTER — Ambulatory Visit: Payer: Self-pay | Admitting: Hematology and Oncology

## 2014-03-09 LAB — CBC CANCER CENTER
Basophil #: 0.1 x10 3/mm (ref 0.0–0.1)
Basophil %: 0.6 %
Eosinophil #: 0.2 x10 3/mm (ref 0.0–0.7)
Eosinophil %: 2.3 %
HCT: 35.4 % (ref 35.0–47.0)
HGB: 11.1 g/dL — ABNORMAL LOW (ref 12.0–16.0)
Lymphocyte #: 1.5 x10 3/mm (ref 1.0–3.6)
Lymphocyte %: 14.4 %
MCH: 28.1 pg (ref 26.0–34.0)
MCHC: 31.3 g/dL — ABNORMAL LOW (ref 32.0–36.0)
MCV: 90 fL (ref 80–100)
Monocyte #: 0.6 x10 3/mm (ref 0.2–0.9)
Monocyte %: 5.9 %
Neutrophil #: 7.7 x10 3/mm — ABNORMAL HIGH (ref 1.4–6.5)
Neutrophil %: 76.8 %
Platelet: 217 x10 3/mm (ref 150–440)
RBC: 3.94 10*6/uL (ref 3.80–5.20)
RDW: 15.2 % — ABNORMAL HIGH (ref 11.5–14.5)
WBC: 10.1 x10 3/mm (ref 3.6–11.0)

## 2014-03-09 LAB — COMPREHENSIVE METABOLIC PANEL
Albumin: 3.5 g/dL (ref 3.4–5.0)
Alkaline Phosphatase: 113 U/L
Anion Gap: 10 (ref 7–16)
BUN: 35 mg/dL — ABNORMAL HIGH (ref 7–18)
Bilirubin,Total: 0.3 mg/dL (ref 0.2–1.0)
Calcium, Total: 8.8 mg/dL (ref 8.5–10.1)
Chloride: 101 mmol/L (ref 98–107)
Co2: 30 mmol/L (ref 21–32)
Creatinine: 1.5 mg/dL — ABNORMAL HIGH (ref 0.60–1.30)
EGFR (African American): 47 — ABNORMAL LOW
EGFR (Non-African Amer.): 39 — ABNORMAL LOW
Glucose: 95 mg/dL (ref 65–99)
Osmolality: 289 (ref 275–301)
Potassium: 4.2 mmol/L (ref 3.5–5.1)
SGOT(AST): 16 U/L (ref 15–37)
SGPT (ALT): 23 U/L
Sodium: 141 mmol/L (ref 136–145)
Total Protein: 7.3 g/dL (ref 6.4–8.2)

## 2014-03-10 LAB — CANCER ANTIGEN 27.29: CA 27.29: 17.9 U/mL (ref 0.0–38.6)

## 2014-03-16 ENCOUNTER — Ambulatory Visit: Payer: Self-pay | Admitting: Hematology and Oncology

## 2014-04-06 ENCOUNTER — Encounter: Payer: Self-pay | Admitting: General Surgery

## 2014-04-08 ENCOUNTER — Encounter: Payer: Self-pay | Admitting: General Surgery

## 2014-04-08 ENCOUNTER — Ambulatory Visit (INDEPENDENT_AMBULATORY_CARE_PROVIDER_SITE_OTHER): Payer: BLUE CROSS/BLUE SHIELD | Admitting: General Surgery

## 2014-04-08 VITALS — BP 128/70 | HR 80 | Resp 16 | Ht 64.0 in | Wt 328.0 lb

## 2014-04-08 DIAGNOSIS — C50911 Malignant neoplasm of unspecified site of right female breast: Secondary | ICD-10-CM

## 2014-04-08 NOTE — Progress Notes (Signed)
Patient ID: Nicole Schmidt, female   DOB: 15-Sep-1965, 49 y.o.   MRN: [id number]  Chief Complaint  Patient presents with  . Follow-up    mammogram     HPI Nicole Schmidt is a 49 y.o. female who presents for a breast cancer follow up. The most recent mammogram was done on 04/03/14. Patient does perform regular self breast checks and gets regular mammograms done. Ne new complaints at this time.    HPI  Past Medical History  Diagnosis Date  . Arthritis   . Menopause     age 49  . Hypertension   . Gout   . Thyroid goiter   . 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 Er/PR pos, her 2 negative    Past Surgical History  Procedure Laterality Date  . Breast surgery Right 2014    with sentinel node bx subareaolar duct excision  . Breast biopsy Right 2014    Family History  Problem Relation Age of Onset  . Diabetes Father   . Hypertension Father   . Hypertension Mother   . Rheum arthritis Mother   . Atrial fibrillation Mother   . Parkinson's disease Father     Social History History  Substance Use Topics  . Smoking status: Never Smoker   . Smokeless tobacco: Never Used  . Alcohol Use: No    No Known Allergies  Current Outpatient Prescriptions  Medication Sig Dispense Refill  . allopurinol (ZYLOPRIM) 300 MG tablet Take 1 tablet by mouth daily.    Marland Kitchen amLODipine (NORVASC) 10 MG tablet Take 1 tablet by mouth daily.    Francia Greaves THYROID 60 MG tablet Take 2 tablets by mouth daily.    . Calcium Carbonate-Vitamin D (CALCIUM 600 + D PO) Take 1 tablet by mouth daily.    . Chromium-Cinnamon [phone] MCG-MG CAPS Take 2 capsules by mouth daily.    . ergocalciferol (VITAMIN D2) 50000 UNITS capsule Take 50,000 Units by mouth once a week.    . folic acid (FOLVITE) Q000111Q MCG tablet Take 400 mcg by mouth daily.    . furosemide (LASIX) 20 MG tablet Take 1 tablet by mouth daily.    Marland Kitchen lisinopril-hydrochlorothiazide  (PRINZIDE,ZESTORETIC) 20-25 MG per tablet Take 1 tablet by mouth daily.    . metFORMIN (GLUCOPHAGE) 1000 MG tablet Take 1 tablet by mouth 2 (two) times daily.    . tamoxifen (NOLVADEX) 20 MG tablet Take 1 tablet by mouth daily.     No current facility-administered medications for this visit.    Review of Systems Review of Systems  Constitutional: Negative.   Respiratory: Negative.   Cardiovascular: Negative.     Blood pressure 128/70, pulse 80, resp. rate 16, height 5\' 4"  (1.626 m), weight 328 lb (148.78 kg), last menstrual period 07/17/2012.  Physical Exam Physical Exam  Constitutional: She is oriented to person, place, and time. She appears well-developed and well-nourished.  Eyes: Conjunctivae are normal. No scleral icterus.  Neck: Neck supple. No thyromegaly present.  Cardiovascular: Normal rate, regular rhythm and normal heart sounds.   No murmur heard. Pulmonary/Chest: Effort normal and breath sounds normal. Right breast exhibits no inverted nipple, no mass, no nipple discharge, no skin change and no tenderness. Left breast exhibits no inverted nipple, no mass, no nipple discharge, no skin change and no tenderness.  Abdominal: Soft. Bowel sounds are normal. There is no tenderness.  Lymphadenopathy:    She  has no cervical adenopathy.    She has no axillary adenopathy.  Neurological: She is alert and oriented to person, place, and time.  Skin: Skin is warm and dry.    Data Reviewed Mammogram right reviewed-stable.  Assessment     Stable exam. Pt is 9mos post right breast lumpectomy, radiation for stage 1 CA. Currently on Tamoxifen and doing well.     Plan    6  Mo  follow up with bilateral diagnostic mammogram.        Breean Nannini G 04/08/2014, 6:04 PM

## 2014-04-08 NOTE — Patient Instructions (Signed)
Patient to return in 6 months for follow up. Continue self breast exams. Call office for any new breast issues or concerns.

## 2014-06-03 LAB — CREATININE, SERUM: Creatine, Serum: 1.5

## 2014-06-05 NOTE — Consult Note (Signed)
Reason for Visit: This 49 year old Female patient presents to the clinic for initial evaluation of  breast cancer .   Referred by Dr. Jamal Collin.  Diagnosis:  Chief Complaint/Diagnosis   49 year old female status post wide local excision of the subareolar region for a pathologic stage TI B. NX M0) invasive mammary carcinoma ER/PR positive HER-2/neu not overexpressed.  Pathology Report pathology report reviewed   Imaging Report mammograms reviewed   Referral Report clinical notes reviewed   Planned Treatment Regimen probable whole breast radiation   HPI   patient is a 49 year old female who presented with bloody nipple discharge from the right breast. She was found on mammogram to haveabnormality in the inferior portion a subareolar complex. Do not have his reports although mammograms are available for my review. She went on to have a wide local excision in the subareolar region showing invasive mammary carcinoma 7 mm in size overall grade 3 histology. Margins were clear but close at less than 1 mm from lack marked inked margin. 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 She had a postop mammogram with some enhancement in the anterior lateral aspect of the right breast of the surgical cavity. She is scheduled for sentinel node biopsy she's been referred to me for opinion regarding radiation therapy. She is doing well at the present time. She is morbidly obese. She specifically denies breast tenderness cough or bone pain at this time.  Past Hx:    Morbidly obese:    Arthritis:    Diabetes Mellitus, Type II (NIDD):    renal insufficiency:    Anemia:    Gout:    Thyroid Goiter:    htn:    thyroid:    Denies surgical history.:   Past, Family and Social History:  Past Medical History positive   Cardiovascular hypertension   Genitourinary renal insufficiency   Endocrine diabetes mellitus   Past Medical History  Comments morbid obesity, arthritis, anemia, gout   Family History noncontributory   Social History noncontributory   Additional Past Medical and Surgical History seen by herself today   Allergies:   No Known Allergies:   Home Meds:  Home Medications: Medication Instructions Status  traMADol 50 mg oral tablet  orally 1 tabklet every 6 hours as needed for pain Active  bromocriptine 2.5 mg oral tablet 0.5 tab(s) orally once a day Active  allopurinol 300 mg oral tablet 1 tab(s) orally once a day Active  amLODIPine 10 mg oral tablet 1 tab(s) orally once a day (in the morning) Active  Armour Thyroid 60 mg oral tablet 1 tab(s) orally once a day Active  Calcium 600+D 600 mg-200 units oral tablet 1 tab(s) orally once a day Active  Cinnamon 500 mg oral capsule 2 cap(s) orally 2 times a day Active  ergocalciferol 50,000 intl units oral capsule 1 cap(s) orally once a week-on Fridays Active  folic acid 0.4 mg oral tablet 1 tab(s) orally once a day Active  furosemide 20 mg oral tablet 1 tab(s) orally once a day Active  hydrochlorothiazide-lisinopril 25 mg-20 mg oral tablet 1 tab(s) orally once a day Active  metFORMIN 1000 mg oral tablet 1 tab(s) orally 2 times a day Active  Victoza 18 mg/3 mL subcutaneous solution 1.8 milligram(s) subcutaneous once a day (in the evening) Active   Review of Systems:  Performance Status (ECOG) 0   Skin negative   Breast see HPI   Ophthalmologic negative   ENMT  negative   Respiratory and Thorax negative   Cardiovascular negative   Gastrointestinal negative   Genitourinary negative   Musculoskeletal negative   Neurological negative   Psychiatric negative   Hematology/Lymphatics negative   Endocrine negative   Allergic/Immunologic negative   Review of Systems   except for morbid obesity according to the nurse's notesPatient denies any weight loss, fatigue, weakness, fever, chills or night sweats. Patient denies any loss of vision, blurred  vision. Patient denies any ringing  of the ears or hearing loss. No irregular heartbeat. Patient denies heart murmur or history of fainting. Patient denies any chest pain or pain radiating to her upper extremities. Patient denies any shortness of breath, difficulty breathing at night, cough or hemoptysis. Patient denies any swelling in the lower legs. Patient denies any nausea vomiting, vomiting of blood, or coffee ground material in the vomitus. Patient denies any stomach pain. Patient states has had normal bowel movements no significant constipation or diarrhea. Patient denies any dysuria, hematuria or significant nocturia. Patient denies any problems walking, swelling in the joints or loss of balance. Patient denies any skin changes, loss of hair or loss of weight. Patient denies any excessive worrying or anxiety or significant depression. Patient denies any problems with insomnia. Patient denies excessive thirst, polyuria, polydipsia. Patient denies any swollen glands, patient denies easy bruising or easy bleeding. Patient denies any recent infections, allergies or URI. Patient "s visual fields have not changed significantly in recent time.  Nursing Notes:  Nursing Vital Signs and Chemo Nursing Nursing Notes: *CC Vital Signs Flowsheet:   25-Jun-14 09:33  Temp Temperature 99.4  Pulse Pulse 86  Respirations Respirations 18  SBP SBP 131  DBP DBP 80  Current Weight (kg) (kg) 154   Physical Exam:  General/Skin/HEENT:  Skin normal   Eyes normal   ENMT normal   Head and Neck normal   Additional PE well-developed obese female in NAD. She status post wide local excision of the right breast crossing over the nipple area looks complex with sparing of the nipple complex. No dominant mass or nodularity is noted in either breast into position examined. No axillary or supraclavicular adenopathy is identified. Lungs are clear to A&P cardiac examination shows regular rate and rhythm.    Breasts/Resp/CV/GI/GU:  Respiratory and Thorax normal   Cardiovascular normal   Gastrointestinal normal   Genitourinary normal   MS/Neuro/Psych/Lymph:  Musculoskeletal normal   Neurological normal   Lymphatics normal   Assessment and Plan: Impression:   probable early stage invasive mammary carcinoma status post wide local excision in 49 year old female with questionable inferior margin which is clear but close on pathology although suspicious on MRI. Plan:   have discussed the case with Dr. Jamal Collin personally who is also discussed the case with his partner Dr. Hervey Ard. Based on surgical pathology showing clear margin although close do not believe reexcision is necessary. MRI may be an over read and based on prior surgery. Certainly patient needs sentinel node biopsy which will be performed next week and treatment recommendations will follow that pathology report. I would plan on delivering 5000 cGy to her right breast boosting or scar another 2000 cGy based on the close margin. Risks and benefits of treatment were reviewed with the patient. Treatment side effects including skin irritation, alteration blood counts, possible inclusion of some superficial lung, fatigue, were explained in detail to the patient. We will await her pot pathology report from her sentinel node biopsy performed proceeding as if sentinel node is  positive she may be a candidate for systemic chemotherapy. I have set her up for a followup visit shortly after her sentinel node biopsy.  I would like to take this opportunity to thank you for allowing me to continue to participate in this patient's care.  CC Referral:  cc: Dr. Emilee Hero., Dr. Ronnald Collum   Electronic Signatures: Armstead Peaks (MD)  (Signed 25-Jun-14 14:04)  Authored: HPI, Diagnosis, Past Hx, PFSH, Allergies, Home Meds, ROS, Nursing Notes, Physical Exam, Encounter Assessment and Plan, CC Referring Physician   Last Updated: 25-Jun-14 14:04  by Armstead Peaks (MD)

## 2014-06-05 NOTE — Op Note (Signed)
PATIENT NAME:  ROBERTINE, Nicole Schmidt MR#:  [id number] DATE OF BIRTH:  Mar 12, 1965  DATE OF PROCEDURE:  07/19/2012  PREOPERATIVE DIAGNOSIS: Bloody discharge from the right nipple.   POSTOPERATIVE DIAGNOSIS: Bloody discharge from the right nipple.  PROCEDURE PERFORMED:  Subareolar duct excision, right breast.   SURGEON: Mckinley Jewel, M.D.   ANESTHESIA: General.   COMPLICATIONS: None.   ESTIMATED BLOOD LOSS: Less than 20 mL.   DRAINS: None.   DESCRIPTION OF PROCEDURE: The patient was put to sleep in the supine position on the operating table. The right breast was prepped and draped out as a sterile field. By prior evaluation it was noted the patient had multiple ducts along the 12 to 3 o'clock location from which the drainage appeared to be coming.  This was verified again with ultrasound and by palpation and accordingly incision was planned along the upper inner quadrant of the areolar margin. Local anesthetic of 0.5% Marcaine was instilled for postoperative analgesia. A skin incision was made from the 12 to 3 o'clock position. The areolar skin was then lifted up and dissected down all the way to the nipple area and immediately underlying this were multiple ducts containing dark blood.  With careful exposure, this area was completely excised out and it appeared that there was fairly extensive number of ducts containing this dark bloody fluid. After ensuring that this was adequately excised, the area was inspected and bleeding controlled with cautery. The excised tissue was sent to pathology, fresh. The deeper tissues were closed with 2-0 Vicryl and the skin approximated with a subcuticular 4-0 Vicryl stitch covered with Dermabond. The procedure was well tolerated. She was subsequently returned to the recovery room in stable condition.  ____________________________ S.Robinette Haines, MD sgs:sb D: 07/22/2012 08:05:01 ET T: 07/22/2012 08:44:07 ET JOB#: [id number]  cc: Synthia Innocent. Jamal Collin, MD, <Dictator> [facility] Robinette Haines MD ELECTRONICALLY SIGNED 07/22/2012 18:55

## 2014-06-05 NOTE — Op Note (Signed)
PATIENT NAME:  Nicole Schmidt, Nicole Schmidt MR#:  [id number] DATE OF BIRTH:  [birth year]  DATE OF PROCEDURE:  08/12/2012  PREOPERATIVE DIAGNOSIS: Carcinoma of the right breast.   POSTOPERATIVE DIAGNOSIS: Carcinoma of the right breast.   OPERATION: Sentinel node biopsy from the right axilla.   SURGEON: Mckinley Jewel, MD   ANESTHESIA: General.   COMPLICATIONS: None.   ESTIMATED BLOOD LOSS: Minimal.   DRAINS: None.   DESCRIPTION OF PROCEDURE: The patient was brought to the operating room she had nuclear contrast injected. She was placed in the supine position on the operating table, put to sleep with an LMA. The right axilla and outer breast area was prepped and draped out as a sterile field. After instillation of 5 mL of methylene blue diluted to 50%. This was done in the subareolar space. The timeout procedure was performed.  The Gamma Finder was then utilized. There was signal activity noted in the medial portion of the inferior portion of the axilla with the rest of the axilla being pretty much quiet. Incision was made overlying the area of signal activity transversely, and the incision was then carried down through the tissues down to the fascia, which was then opened to expose the axillary fat pad. Bleeding was controlled with cautery and ligatures of 3-0 Vicryl.  Using the [facility], a node was identified about 1.5 cm sized, slightly firm in consistency and with a bluish tinge and showed significant signal activity in vivo and in vitro.  This node was excised out and sent as sentinel node.  Looking for other nodes was essentially negative. There was no signal activity after removal of the first node, and no blue-appearing nodes were noted, and no palpable nodes were noted. The wound was then closed with 2-0 Vicryl in the deeper tissues and 3-0 Vicryl in the subcutaneous tissue and the skin closed with subcuticular 4-0 Vicryl covered with Dermabond.   Initial assay by pathologist showed no evidence of  macro metastases in the lymph node. The patient subsequently was returned to the recovery room in stable condition.    ____________________________ S.Robinette Haines, MD sgs:cb D: 08/12/2012 19:49:16 ET T: 08/12/2012 21:02:58 ET JOB#: [id number]  cc: Synthia Innocent. Jamal Collin, MD, <Dictator> [facility] Robinette Haines MD ELECTRONICALLY SIGNED 08/16/2012 9:28

## 2014-07-06 ENCOUNTER — Encounter (INDEPENDENT_AMBULATORY_CARE_PROVIDER_SITE_OTHER): Payer: Self-pay

## 2014-07-06 ENCOUNTER — Inpatient Hospital Stay: Payer: BLUE CROSS/BLUE SHIELD | Attending: Hematology and Oncology | Admitting: Hematology and Oncology

## 2014-07-06 VITALS — BP 183/69 | HR 84 | Temp 98.0°F | Ht 63.0 in | Wt 337.7 lb

## 2014-07-06 DIAGNOSIS — E119 Type 2 diabetes mellitus without complications: Secondary | ICD-10-CM | POA: Insufficient documentation

## 2014-07-06 DIAGNOSIS — M109 Gout, unspecified: Secondary | ICD-10-CM | POA: Diagnosis not present

## 2014-07-06 DIAGNOSIS — Z79899 Other long term (current) drug therapy: Secondary | ICD-10-CM | POA: Insufficient documentation

## 2014-07-06 DIAGNOSIS — I10 Essential (primary) hypertension: Secondary | ICD-10-CM | POA: Diagnosis not present

## 2014-07-06 DIAGNOSIS — C50911 Malignant neoplasm of unspecified site of right female breast: Secondary | ICD-10-CM

## 2014-07-06 DIAGNOSIS — Z7901 Long term (current) use of anticoagulants: Secondary | ICD-10-CM | POA: Diagnosis not present

## 2014-07-06 DIAGNOSIS — Z79810 Long term (current) use of selective estrogen receptor modulators (SERMs): Secondary | ICD-10-CM

## 2014-07-06 DIAGNOSIS — B3789 Other sites of candidiasis: Secondary | ICD-10-CM | POA: Insufficient documentation

## 2014-07-06 DIAGNOSIS — Z170 Estrogen receptor positive status [ER+]: Secondary | ICD-10-CM | POA: Diagnosis not present

## 2014-07-06 MED ORDER — NYSTATIN 100000 UNIT/GM EX POWD: CUTANEOUS | Status: DC

## 2014-07-06 NOTE — Progress Notes (Signed)
Pt here today for follow up regarding breast cancer; offers no complaints today

## 2014-08-12 ENCOUNTER — Other Ambulatory Visit: Payer: Self-pay

## 2014-08-12 DIAGNOSIS — C50211 Malignant neoplasm of upper-inner quadrant of right female breast: Secondary | ICD-10-CM

## 2014-09-30 ENCOUNTER — Encounter: Payer: Self-pay | Admitting: Radiation Oncology

## 2014-09-30 ENCOUNTER — Ambulatory Visit
Admission: RE | Admit: 2014-09-30 | Discharge: 2014-09-30 | Disposition: A | Discharge status: Home / Self Care | Payer: BLUE CROSS/BLUE SHIELD | Source: Ambulatory Visit | Attending: Radiation Oncology | Admitting: Radiation Oncology

## 2014-09-30 VITALS — BP 150/69 | HR 86 | Temp 99.7°F | Resp 20 | Wt 341.1 lb

## 2014-09-30 DIAGNOSIS — C50911 Malignant neoplasm of unspecified site of right female breast: Secondary | ICD-10-CM

## 2014-09-30 NOTE — Progress Notes (Signed)
.  Radiation Oncology Follow up Note  Name: Nicole Schmidt   Date:   09/30/2014 MRN:  [id number] DOB: Aug 31, 1965    This 49 y.o. female presents to the clinic today for follow-up for breast cancer stage I ER/PR positive HER-2/neu not overexpressed now out 2 years since whole breast radiation.  REFERRING PROVIDER: Lenard Simmer, MD  HPI: Patient is a 49 year old female now out 2 years having completed whole breast radiation to her right breast for a T1d N0 invasive mammary carcinoma ER/PR positive HER-2/neu not overexpressed. Seen today in routine follow-up she is doing well. She specifically denies breast tenderness cough or bone pain.. She had a mammogram last week which was fine according to the patient. She is currently on aromatase inhibitor and tolerating that well without side effect.  COMPLICATIONS OF TREATMENT: none  FOLLOW UP COMPLIANCE: keeps appointments   PHYSICAL EXAM:  BP 150/69 mmHg  Pulse 86  Temp(Src) 99.7 F (37.6 C)  Resp 20  Wt 341 lb 0.8 oz (154.7 kg)  LMP 07/17/2012 Well-developed obese female in NAD.Lungs are clear to A&P cardiac examination essentially unremarkable with regular rate and rhythm. No dominant mass or nodularity is noted in either breast in 2 positions examined. Incision is well-healed. No axillary or supraclavicular adenopathy is appreciated. Cosmetic result is excellent. There is still some fibrosis and hyperpigmentation of her skin although cosmetic result is still good to excellent. Well-developed well-nourished patient in NAD. HEENT reveals PERLA, EOMI, discs not visualized.  Oral cavity is clear. No oral mucosal lesions are identified. Neck is clear without evidence of cervical or supraclavicular adenopathy. Lungs are clear to A&P. Cardiac examination is essentially unremarkable with regular rate and rhythm without murmur rub or thrill. Abdomen is benign with no organomegaly or masses noted. Motor sensory and DTR levels are equal and symmetric in  the upper and lower extremities. Cranial nerves II through XII are grossly intact. Proprioception is intact. No peripheral adenopathy or edema is identified. No motor or sensory levels are noted. Crude visual fields are within normal range.   RADIOLOGY RESULTS: Recent mammograms are reviewed  PLAN: Present time she continues to do well with no evidence of disease. I have set her up for 1 year follow-up. She continues close follow-up care with medical oncology and surgeon. Patient knows to call with any concerns.  I would like to take this opportunity for allowing me to participate in the care of your patient.Armstead Peaks., MD

## 2014-10-13 ENCOUNTER — Ambulatory Visit (INDEPENDENT_AMBULATORY_CARE_PROVIDER_SITE_OTHER): Payer: BLUE CROSS/BLUE SHIELD | Admitting: General Surgery

## 2014-10-13 ENCOUNTER — Encounter: Payer: Self-pay | Admitting: General Surgery

## 2014-10-13 VITALS — BP 110/60 | HR 76 | Resp 14 | Ht 64.0 in | Wt 337.0 lb

## 2014-10-13 DIAGNOSIS — C50911 Malignant neoplasm of unspecified site of right female breast: Secondary | ICD-10-CM | POA: Diagnosis not present

## 2014-10-13 NOTE — Patient Instructions (Signed)
Patient to have a bilateral diagnostic mammogram follow up in one year.  Continue self breast exams. Call office for any new breast issues or concerns.

## 2014-10-13 NOTE — Progress Notes (Signed)
Patient ID: Nicole Schmidt, female   DOB: [birth year], 49 y.o.   MRN: [id number]  Chief Complaint  Patient presents with  . Follow-up    mammogram    HPI Nicole Schmidt is a 49 y.o. female who presents for a breast cancer follow up. The most recent mammogram was done on 09/29/14. Patient does perform regular self breast checks and gets regular mammograms done. No new complaints at this time. Tolerating the tamoxifen well.  HPI  Past Medical History  Diagnosis Date  . Arthritis   . Menopause     age 75  . Hypertension   . Gout   . Thyroid goiter   . 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 Er/PR pos, her 2 negative  . Diabetes mellitus without complication   . Renal insufficiency   . Anemia   . Gout     Past Surgical History  Procedure Laterality Date  . Breast surgery Right 2014    with sentinel node bx subareaolar duct excision  . Breast biopsy Right 2014    Family History  Problem Relation Age of Onset  . Diabetes Father   . Hypertension Father   . Hypertension Mother   . Rheum arthritis Mother   . Atrial fibrillation Mother   . Parkinson's disease Father     Social History Social History  Substance Use Topics  . Smoking status: Never Smoker   . Smokeless tobacco: Never Used  . Alcohol Use: No    No Known Allergies  Current Outpatient Prescriptions  Medication Sig Dispense Refill  . allopurinol (ZYLOPRIM) 300 MG tablet Take 1 tablet by mouth daily.    Marland Kitchen amLODipine (NORVASC) 10 MG tablet Take 1 tablet by mouth daily.    Francia Greaves THYROID 60 MG tablet Take 2 tablets by mouth daily.    . Calcium Carbonate-Vitamin D (CALCIUM 600 + D PO) Take 1 tablet by mouth daily.    . Chromium-Cinnamon [phone] MCG-MG CAPS Take 2 capsules by mouth daily.    . ergocalciferol (VITAMIN D2) 50000 UNITS capsule Take 50,000 Units by mouth once a week.    . folic acid (FOLVITE) Q000111Q MCG tablet Take 400 mcg by  mouth daily.    . furosemide (LASIX) 20 MG tablet Take 1 tablet by mouth daily.    Marland Kitchen lisinopril-hydrochlorothiazide (PRINZIDE,ZESTORETIC) 20-25 MG per tablet Take 1 tablet by mouth daily.    . metFORMIN (GLUCOPHAGE) 1000 MG tablet Take 1 tablet by mouth 2 (two) times daily.    Marland Kitchen NOVOFINE 30G X 8 MM MISC     . nystatin (MYCOSTATIN/NYSTOP) 100000 UNIT/GM POWD Apply topically to rash 3 times a day 30 g 1  . ONE TOUCH ULTRA TEST test strip     . ONETOUCH DELICA LANCETS FINE MISC     . tamoxifen (NOLVADEX) 20 MG tablet Take 1 tablet by mouth daily.     No current facility-administered medications for this visit.    Review of Systems Review of Systems  Constitutional: Negative.   Respiratory: Negative.   Cardiovascular: Negative.     Blood pressure 110/60, pulse 76, resp. rate 14, height 5\' 4"  (1.626 m), weight 337 lb (152.862 kg), last menstrual period 07/17/2012.  Physical Exam Physical Exam  Constitutional: She is oriented to person, place, and time. She appears well-developed and well-nourished.  HENT:  Mouth/Throat: Oropharynx is clear and moist.  Eyes: Conjunctivae are normal. No scleral  icterus.  Neck: Neck supple.  Cardiovascular: Normal rate, regular rhythm and normal heart sounds.   Pulmonary/Chest: Effort normal and breath sounds normal. Right breast exhibits no inverted nipple, no mass, no nipple discharge, no skin change and no tenderness. Left breast exhibits no inverted nipple, no mass, no nipple discharge, no skin change and no tenderness.  Right breast lumpectomy site retro areolar and looks well healed and no palpable abnormality.  Abdominal: Soft. There is no tenderness.  Lymphadenopathy:    She has no cervical adenopathy.    She has no axillary adenopathy.  Neurological: She is alert and oriented to person, place, and time.  Skin: Skin is warm and dry.  Psychiatric: Her behavior is normal.    Data Reviewed Mammogram reviewed and stable.  Assessment     Stable physical exam. Pt is 2 years post right breast lumpectomy, sentinel node biopsy and radiation for stage 1 CA. Currently on Tamoxifen and doing well.     Plan    Patient to have a bilateral diagnostic mammogram follow up in one year.  Continue self breast exams. Call office for any new breast issues or concerns.    PCP:  Lenard Simmer   Oryn Casanova G 10/15/2014, 11:23 AM

## 2014-10-15 ENCOUNTER — Encounter: Payer: Self-pay | Admitting: General Surgery

## 2014-10-15 DIAGNOSIS — I2699 Other pulmonary embolism without acute cor pulmonale: Secondary | ICD-10-CM

## 2014-10-15 HISTORY — DX: Other pulmonary embolism without acute cor pulmonale: I26.99

## 2014-10-18 ENCOUNTER — Inpatient Hospital Stay
Admission: EM | Admit: 2014-10-18 | Discharge: 2014-10-18 | DRG: 176 | Disposition: A | Discharge status: Other Acute Inpatient Hospital | Payer: BLUE CROSS/BLUE SHIELD | Attending: Internal Medicine | Admitting: Internal Medicine

## 2014-10-18 ENCOUNTER — Inpatient Hospital Stay (HOSPITAL_COMMUNITY)
Admission: AD | Admit: 2014-10-18 | Discharge: 2014-10-23 | DRG: 176 | Disposition: A | Discharge status: Home Health | Payer: BLUE CROSS/BLUE SHIELD | Source: Other Acute Inpatient Hospital | Attending: Internal Medicine | Admitting: Internal Medicine

## 2014-10-18 ENCOUNTER — Encounter: Payer: Self-pay | Admitting: Emergency Medicine

## 2014-10-18 ENCOUNTER — Emergency Department: Payer: BLUE CROSS/BLUE SHIELD

## 2014-10-18 ENCOUNTER — Inpatient Hospital Stay (HOSPITAL_COMMUNITY): Payer: BLUE CROSS/BLUE SHIELD

## 2014-10-18 ENCOUNTER — Inpatient Hospital Stay
Admission: AD | Admit: 2014-10-18 | Payer: Self-pay | Source: Other Acute Inpatient Hospital | Admitting: Pulmonary Disease

## 2014-10-18 DIAGNOSIS — I959 Hypotension, unspecified: Secondary | ICD-10-CM | POA: Diagnosis not present

## 2014-10-18 DIAGNOSIS — E119 Type 2 diabetes mellitus without complications: Secondary | ICD-10-CM | POA: Diagnosis present

## 2014-10-18 DIAGNOSIS — E662 Morbid (severe) obesity with alveolar hypoventilation: Secondary | ICD-10-CM | POA: Diagnosis present

## 2014-10-18 DIAGNOSIS — Z6841 Body Mass Index (BMI) 40.0 and over, adult: Secondary | ICD-10-CM

## 2014-10-18 DIAGNOSIS — I129 Hypertensive chronic kidney disease with stage 1 through stage 4 chronic kidney disease, or unspecified chronic kidney disease: Secondary | ICD-10-CM | POA: Diagnosis present

## 2014-10-18 DIAGNOSIS — E049 Nontoxic goiter, unspecified: Secondary | ICD-10-CM | POA: Diagnosis present

## 2014-10-18 DIAGNOSIS — N183 Chronic kidney disease, stage 3 unspecified: Secondary | ICD-10-CM

## 2014-10-18 DIAGNOSIS — R34 Anuria and oliguria: Secondary | ICD-10-CM | POA: Diagnosis not present

## 2014-10-18 DIAGNOSIS — Z853 Personal history of malignant neoplasm of breast: Secondary | ICD-10-CM

## 2014-10-18 DIAGNOSIS — E1122 Type 2 diabetes mellitus with diabetic chronic kidney disease: Secondary | ICD-10-CM | POA: Diagnosis present

## 2014-10-18 DIAGNOSIS — R0600 Dyspnea, unspecified: Secondary | ICD-10-CM | POA: Diagnosis not present

## 2014-10-18 DIAGNOSIS — M109 Gout, unspecified: Secondary | ICD-10-CM | POA: Diagnosis present

## 2014-10-18 DIAGNOSIS — I248 Other forms of acute ischemic heart disease: Secondary | ICD-10-CM | POA: Diagnosis present

## 2014-10-18 DIAGNOSIS — A419 Sepsis, unspecified organism: Secondary | ICD-10-CM | POA: Diagnosis present

## 2014-10-18 DIAGNOSIS — E860 Dehydration: Secondary | ICD-10-CM | POA: Diagnosis present

## 2014-10-18 DIAGNOSIS — R7989 Other specified abnormal findings of blood chemistry: Secondary | ICD-10-CM | POA: Diagnosis not present

## 2014-10-18 DIAGNOSIS — Z9981 Dependence on supplemental oxygen: Secondary | ICD-10-CM

## 2014-10-18 DIAGNOSIS — Z8249 Family history of ischemic heart disease and other diseases of the circulatory system: Secondary | ICD-10-CM | POA: Diagnosis not present

## 2014-10-18 DIAGNOSIS — Z79810 Long term (current) use of selective estrogen receptor modulators (SERMs): Secondary | ICD-10-CM

## 2014-10-18 DIAGNOSIS — Z833 Family history of diabetes mellitus: Secondary | ICD-10-CM

## 2014-10-18 DIAGNOSIS — Z820 Family history of epilepsy and other diseases of the nervous system: Secondary | ICD-10-CM

## 2014-10-18 DIAGNOSIS — F519 Sleep disorder not due to a substance or known physiological condition, unspecified: Secondary | ICD-10-CM | POA: Diagnosis present

## 2014-10-18 DIAGNOSIS — G4733 Obstructive sleep apnea (adult) (pediatric): Secondary | ICD-10-CM | POA: Diagnosis present

## 2014-10-18 DIAGNOSIS — I10 Essential (primary) hypertension: Secondary | ICD-10-CM | POA: Diagnosis present

## 2014-10-18 DIAGNOSIS — M1990 Unspecified osteoarthritis, unspecified site: Secondary | ICD-10-CM | POA: Diagnosis present

## 2014-10-18 DIAGNOSIS — E039 Hypothyroidism, unspecified: Secondary | ICD-10-CM | POA: Diagnosis present

## 2014-10-18 DIAGNOSIS — E875 Hyperkalemia: Secondary | ICD-10-CM | POA: Diagnosis not present

## 2014-10-18 DIAGNOSIS — R579 Shock, unspecified: Secondary | ICD-10-CM | POA: Diagnosis not present

## 2014-10-18 DIAGNOSIS — I272 Other secondary pulmonary hypertension: Secondary | ICD-10-CM | POA: Diagnosis present

## 2014-10-18 DIAGNOSIS — N179 Acute kidney failure, unspecified: Secondary | ICD-10-CM | POA: Diagnosis present

## 2014-10-18 DIAGNOSIS — R0602 Shortness of breath: Secondary | ICD-10-CM | POA: Diagnosis present

## 2014-10-18 DIAGNOSIS — R0902 Hypoxemia: Secondary | ICD-10-CM | POA: Diagnosis not present

## 2014-10-18 DIAGNOSIS — Z713 Dietary counseling and surveillance: Secondary | ICD-10-CM | POA: Diagnosis present

## 2014-10-18 DIAGNOSIS — I2699 Other pulmonary embolism without acute cor pulmonale: Secondary | ICD-10-CM | POA: Diagnosis not present

## 2014-10-18 DIAGNOSIS — I82409 Acute embolism and thrombosis of unspecified deep veins of unspecified lower extremity: Secondary | ICD-10-CM

## 2014-10-18 DIAGNOSIS — O2230 Deep phlebothrombosis in pregnancy, unspecified trimester: Secondary | ICD-10-CM

## 2014-10-18 DIAGNOSIS — Z86711 Personal history of pulmonary embolism: Secondary | ICD-10-CM | POA: Diagnosis present

## 2014-10-18 DIAGNOSIS — Z79899 Other long term (current) drug therapy: Secondary | ICD-10-CM

## 2014-10-18 DIAGNOSIS — R778 Other specified abnormalities of plasma proteins: Secondary | ICD-10-CM

## 2014-10-18 DIAGNOSIS — C50919 Malignant neoplasm of unspecified site of unspecified female breast: Secondary | ICD-10-CM | POA: Diagnosis present

## 2014-10-18 DIAGNOSIS — N184 Chronic kidney disease, stage 4 (severe): Secondary | ICD-10-CM

## 2014-10-18 LAB — CBC WITH DIFFERENTIAL/PLATELET
Basophils Absolute: 0.1 10*3/uL (ref 0–0.1)
Basophils Relative: 0 %
Eosinophils Absolute: 0 10*3/uL (ref 0–0.7)
Eosinophils Relative: 0 %
HCT: 37.1 % (ref 35.0–47.0)
Hemoglobin: 11.4 g/dL — ABNORMAL LOW (ref 12.0–16.0)
Lymphocytes Relative: 7 %
Lymphs Abs: 1.3 10*3/uL (ref 1.0–3.6)
MCH: 26.6 pg (ref 26.0–34.0)
MCHC: 30.7 g/dL — ABNORMAL LOW (ref 32.0–36.0)
MCV: 86.9 fL (ref 80.0–100.0)
Monocytes Absolute: 1.5 10*3/uL — ABNORMAL HIGH (ref 0.2–0.9)
Monocytes Relative: 8 %
Neutro Abs: 14.9 10*3/uL — ABNORMAL HIGH (ref 1.4–6.5)
Neutrophils Relative %: 85 %
Platelets: 199 10*3/uL (ref 150–440)
RBC: 4.27 MIL/uL (ref 3.80–5.20)
RDW: 17.1 % — ABNORMAL HIGH (ref 11.5–14.5)
WBC: 17.7 10*3/uL — ABNORMAL HIGH (ref 3.6–11.0)

## 2014-10-18 LAB — COMPREHENSIVE METABOLIC PANEL
ALT: 54 U/L (ref 14–54)
ALT: 240 U/L — ABNORMAL HIGH (ref 14–54)
AST: 66 U/L — ABNORMAL HIGH (ref 15–41)
AST: 305 U/L — ABNORMAL HIGH (ref 15–41)
Albumin: 3.2 g/dL — ABNORMAL LOW (ref 3.5–5.0)
Albumin: 2.7 g/dL — ABNORMAL LOW (ref 3.5–5.0)
Alkaline Phosphatase: 88 U/L (ref 38–126)
Alkaline Phosphatase: 76 U/L (ref 38–126)
Anion gap: 12 (ref 5–15)
Anion gap: 9 (ref 5–15)
BUN: 26 mg/dL — ABNORMAL HIGH (ref 6–20)
BUN: 27 mg/dL — ABNORMAL HIGH (ref 6–20)
CO2: 23 mmol/L (ref 22–32)
CO2: 26 mmol/L (ref 22–32)
Calcium: 8.9 mg/dL (ref 8.9–10.3)
Calcium: 9 mg/dL (ref 8.9–10.3)
Chloride: 102 mmol/L (ref 101–111)
Chloride: 102 mmol/L (ref 101–111)
Creatinine, Ser: 1.48 mg/dL — ABNORMAL HIGH (ref 0.44–1.00)
Creatinine, Ser: 1.88 mg/dL — ABNORMAL HIGH (ref 0.44–1.00)
GFR calc Af Amer: 46 mL/min — ABNORMAL LOW (ref >60)
GFR calc Af Amer: 35 mL/min — ABNORMAL LOW (ref >60)
GFR calc non Af Amer: 40 mL/min — ABNORMAL LOW (ref >60)
GFR calc non Af Amer: 30 mL/min — ABNORMAL LOW (ref >60)
Glucose, Bld: 200 mg/dL — ABNORMAL HIGH (ref 65–99)
Glucose, Bld: 128 mg/dL — ABNORMAL HIGH (ref 65–99)
Potassium: 4.6 mmol/L (ref 3.5–5.1)
Potassium: 5.4 mmol/L — ABNORMAL HIGH (ref 3.5–5.1)
Sodium: 137 mmol/L (ref 135–145)
Sodium: 137 mmol/L (ref 135–145)
Total Bilirubin: 0.9 mg/dL (ref 0.3–1.2)
Total Bilirubin: 0.6 mg/dL (ref 0.3–1.2)
Total Protein: 6.8 g/dL (ref 6.5–8.1)
Total Protein: 6.5 g/dL (ref 6.5–8.1)

## 2014-10-18 LAB — TYPE AND SCREEN
ABO/RH(D): O NEG
Antibody Screen: NEGATIVE

## 2014-10-18 LAB — GLUCOSE, CAPILLARY: Glucose-Capillary: 145 mg/dL — ABNORMAL HIGH (ref 65–99)

## 2014-10-18 LAB — CBC
HCT: 35.5 % — ABNORMAL LOW (ref 36.0–46.0)
Hemoglobin: 10.9 g/dL — ABNORMAL LOW (ref 12.0–15.0)
MCH: 26.8 pg (ref 26.0–34.0)
MCHC: 30.7 g/dL (ref 30.0–36.0)
MCV: 87.4 fL (ref 78.0–100.0)
Platelets: 172 10*3/uL (ref 150–400)
RBC: 4.06 MIL/uL (ref 3.87–5.11)
RDW: 16.1 % — ABNORMAL HIGH (ref 11.5–15.5)
WBC: 11.7 10*3/uL — ABNORMAL HIGH (ref 4.0–10.5)

## 2014-10-18 LAB — TROPONIN I
Troponin I: 0.43 ng/mL — ABNORMAL HIGH (ref <0.031)
Troponin I: 0.68 ng/mL — ABNORMAL HIGH (ref <0.031)
Troponin I: 1 ng/mL (ref <0.031)

## 2014-10-18 LAB — MRSA PCR SCREENING: MRSA by PCR: NEGATIVE

## 2014-10-18 LAB — FIBRINOGEN: Fibrinogen: 456 mg/dL (ref 204–475)

## 2014-10-18 LAB — LACTIC ACID, PLASMA: Lactic Acid, Venous: 2 mmol/L (ref 0.5–2.0)

## 2014-10-18 LAB — PROTIME-INR
INR: 1.56 — ABNORMAL HIGH (ref 0.00–1.49)
Prothrombin Time: 18.7 seconds — ABNORMAL HIGH (ref 11.6–15.2)

## 2014-10-18 LAB — APTT: aPTT: 71 seconds — ABNORMAL HIGH (ref 24–37)

## 2014-10-18 LAB — BRAIN NATRIURETIC PEPTIDE: B Natriuretic Peptide: 173 pg/mL — ABNORMAL HIGH (ref 0.0–100.0)

## 2014-10-18 MED ORDER — SODIUM BICARBONATE 8.4 % IV SOLN: 100.0000 meq | Freq: Once | INTRAVENOUS | Status: DC

## 2014-10-18 MED ORDER — LIDOCAINE HCL 1 % IJ SOLN
INTRAMUSCULAR | Status: AC
  Filled 2014-10-18: qty 20

## 2014-10-18 MED ORDER — MORPHINE SULFATE (PF) 2 MG/ML IV SOLN
2.0000 mg | INTRAVENOUS | Status: DC | PRN
  Administered 2014-10-18: 2 mg via INTRAVENOUS
  Filled 2014-10-18: qty 1

## 2014-10-18 MED ORDER — SODIUM CHLORIDE 0.9 % IV SOLN
Freq: Once | INTRAVENOUS | Status: AC
  Administered 2014-10-18: 16:00:00 via INTRAVENOUS

## 2014-10-18 MED ORDER — SODIUM CHLORIDE 0.9 % IV BOLUS (SEPSIS)
1000.0000 mL | Freq: Once | INTRAVENOUS | Status: AC
  Administered 2014-10-18: 1000 mL via INTRAVENOUS

## 2014-10-18 MED ORDER — INSULIN ASPART 100 UNIT/ML ~~LOC~~ SOLN
0.0000 [IU] | SUBCUTANEOUS | Status: DC
  Administered 2014-10-19 – 2014-10-21 (×2): 2 [IU] via SUBCUTANEOUS
  Administered 2014-10-21 – 2014-10-22 (×2): 3 [IU] via SUBCUTANEOUS
  Administered 2014-10-22 – 2014-10-23 (×3): 2 [IU] via SUBCUTANEOUS

## 2014-10-18 MED ORDER — MIDAZOLAM HCL 2 MG/2ML IJ SOLN
INTRAMUSCULAR | Status: AC | PRN
  Administered 2014-10-18: 1 mg via INTRAVENOUS

## 2014-10-18 MED ORDER — SODIUM CHLORIDE 0.9 % IV SOLN
INTRAVENOUS | Status: DC
  Administered 2014-10-19: 12:00:00 via INTRAVENOUS

## 2014-10-18 MED ORDER — SODIUM CHLORIDE 0.9 % IJ SOLN
3.0000 mL | Freq: Two times a day (BID) | INTRAMUSCULAR | Status: DC
  Administered 2014-10-19 (×2): 3 mL via INTRAVENOUS

## 2014-10-18 MED ORDER — HEPARIN BOLUS VIA INFUSION
5650.0000 [IU] | Freq: Once | INTRAVENOUS | Status: AC
  Administered 2014-10-18: 5650 [IU] via INTRAVENOUS
  Filled 2014-10-18: qty 5650

## 2014-10-18 MED ORDER — HEPARIN (PORCINE) IN NACL 100-0.45 UNIT/ML-% IJ SOLN
1600.0000 [IU]/h | INTRAMUSCULAR | Status: DC
  Administered 2014-10-18: 1600 [IU]/h via INTRAVENOUS
  Filled 2014-10-18 (×2): qty 250

## 2014-10-18 MED ORDER — DEXTROSE 5 % IV SOLN
2.0000 g | Freq: Once | INTRAVENOUS | Status: AC
  Administered 2014-10-18: 2 g via INTRAVENOUS
  Filled 2014-10-18: qty 2

## 2014-10-18 MED ORDER — HEPARIN (PORCINE) IN NACL 100-0.45 UNIT/ML-% IJ SOLN: 15.0000 [IU]/kg/h | Freq: Once | INTRAMUSCULAR | Status: DC

## 2014-10-18 MED ORDER — MIDAZOLAM HCL 2 MG/2ML IJ SOLN
INTRAMUSCULAR | Status: AC
  Filled 2014-10-18: qty 4

## 2014-10-18 MED ORDER — HEPARIN (PORCINE) IN NACL 100-0.45 UNIT/ML-% IJ SOLN
2000.0000 [IU]/h | INTRAMUSCULAR | Status: AC
  Administered 2014-10-19: 1800 [IU]/h via INTRAVENOUS
  Administered 2014-10-19: 1600 [IU]/h via INTRAVENOUS
  Filled 2014-10-18 (×6): qty 250

## 2014-10-18 MED ORDER — SODIUM CHLORIDE 0.9 % IJ SOLN: 3.0000 mL | INTRAMUSCULAR | Status: DC | PRN

## 2014-10-18 MED ORDER — SODIUM CHLORIDE 0.9 % IV SOLN
INTRAVENOUS | Status: DC
  Administered 2014-10-18: via INTRAVENOUS

## 2014-10-18 MED ORDER — AZITHROMYCIN 500 MG IV SOLR
500.0000 mg | Freq: Once | INTRAVENOUS | Status: AC
  Administered 2014-10-18: 500 mg via INTRAVENOUS
  Filled 2014-10-18: qty 500

## 2014-10-18 MED ORDER — INSULIN ASPART 100 UNIT/ML ~~LOC~~ SOLN: 1.0000 [IU] | SUBCUTANEOUS | Status: DC

## 2014-10-18 MED ORDER — FENTANYL CITRATE (PF) 100 MCG/2ML IJ SOLN
INTRAMUSCULAR | Status: AC | PRN
  Administered 2014-10-18: 50 ug via INTRAVENOUS

## 2014-10-18 MED ORDER — IOHEXOL 350 MG/ML SOLN
75.0000 mL | Freq: Once | INTRAVENOUS | Status: AC | PRN
  Administered 2014-10-18: 75 mL via INTRAVENOUS

## 2014-10-18 MED ORDER — SODIUM CHLORIDE 0.9 % IV SOLN
12.0000 mg | Freq: Once | INTRAVENOUS | Status: AC
  Administered 2014-10-18: 12 mg via INTRAVENOUS
  Filled 2014-10-18: qty 12

## 2014-10-18 MED ORDER — SODIUM CHLORIDE 0.9 % IV SOLN: 250.0000 mL | INTRAVENOUS | Status: DC | PRN

## 2014-10-18 MED ORDER — FENTANYL CITRATE (PF) 100 MCG/2ML IJ SOLN
INTRAMUSCULAR | Status: AC
  Filled 2014-10-18: qty 4

## 2014-10-18 MED ORDER — TAMOXIFEN CITRATE 10 MG PO TABS
20.0000 mg | ORAL_TABLET | Freq: Every day | ORAL | Status: DC
  Administered 2014-10-19: 20 mg via ORAL
  Filled 2014-10-18 (×2): qty 2

## 2014-10-18 MED ORDER — ALTEPLASE 50 MG IV SOLR
12.0000 mg | Freq: Once | INTRAVENOUS | Status: AC
  Administered 2014-10-18: 12 mg via INTRAVENOUS
  Filled 2014-10-18: qty 12

## 2014-10-18 MED ORDER — THYROID 120 MG PO TABS
120.0000 mg | ORAL_TABLET | Freq: Every day | ORAL | Status: DC
  Administered 2014-10-19 – 2014-10-23 (×5): 120 mg via ORAL
  Filled 2014-10-18 (×6): qty 1

## 2014-10-18 MED ORDER — IOHEXOL 300 MG/ML  SOLN
50.0000 mL | Freq: Once | INTRAMUSCULAR | Status: DC | PRN
  Administered 2014-10-18: 15 mL via INTRAVENOUS
  Filled 2014-10-18: qty 50

## 2014-10-18 NOTE — Consult Note (Signed)
Chief Complaint: Patient was seen in consultation today for submassive pulmonary embolus at the request of Dr. Boone Master.  Referring Physician(s): Boone Master, MD  History of Present Illness: Nicole Schmidt is a 49 y.o. female with a history of breast cancer now on tamoxifen.  Presented to the ER at [facility] with acute onset severe dyspnea and dizziness.  She felt presyncopal. CTA PE study shows bilateral large volume PE and significant elevation of the RV/LV ratio to 2.4 c/w right heart strain.  Troponins are elevated and rising c/w demand ischemia and clinically significant right heart strain. sPESI is at least 1 due to prior hx of cancer.    She feels extremely short of breath.  She has mild chest discomfort.  Denies fever, chills, nausea.  No recent surgery.  No history of stroke or TIA.  Past Medical History  Diagnosis Date  . Arthritis   . Menopause     age 47  . Hypertension   . Gout   . Thyroid goiter   . 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 Er/PR pos, her 2 negative  . Diabetes mellitus without complication   . Renal insufficiency   . Anemia   . Gout     Past Surgical History  Procedure Laterality Date  . Breast surgery Right 2014    with sentinel node bx subareaolar duct excision  . Breast biopsy Right 2014    Allergies: Review of patient's allergies indicates no known allergies.  Medications: Prior to Admission medications   Medication Sig Start Date End Date Taking? Authorizing Provider  allopurinol (ZYLOPRIM) 300 MG tablet Take 1 tablet by mouth every morning.  04/10/12   Historical Provider, MD  amLODipine (NORVASC) 10 MG tablet Take 1 tablet by mouth daily. 06/17/12   Historical Provider, MD  ARMOUR THYROID 60 MG tablet Take 2 tablets by mouth daily. 07/03/12   Historical Provider, MD  Calcium Carbonate-Vitamin D (CALCIUM 600 + D PO) Take 1 tablet by mouth daily.    Historical  Provider, MD  CINNAMON PO Take 2,000 mg by mouth daily.    Historical Provider, MD  ergocalciferol (VITAMIN D2) 50000 UNITS capsule Take 50,000 Units by mouth once a week. Take on Fridays.    Historical Provider, MD  ferrous sulfate 325 (65 FE) MG tablet Take 325 mg by mouth daily.    Historical Provider, MD  folic acid (FOLVITE) Q000111Q MCG tablet Take 800 mcg by mouth daily.     Historical Provider, MD  furosemide (LASIX) 20 MG tablet Take 1 tablet by mouth every morning.  04/25/12   Historical Provider, MD  lisinopril-hydrochlorothiazide (PRINZIDE,ZESTORETIC) 20-25 MG per tablet Take 1 tablet by mouth daily. 07/03/12   Historical Provider, MD  metFORMIN (GLUCOPHAGE) 1000 MG tablet Take 1 tablet by mouth 2 (two) times daily. 07/03/12   Historical Provider, MD  nystatin (MYCOSTATIN/NYSTOP) 100000 UNIT/GM POWD Apply topically to rash 3 times a day 07/06/14   Lequita Asal, MD  tamoxifen (NOLVADEX) 20 MG tablet Take 1 tablet by mouth daily. 01/14/13   Historical Provider, MD     Family History  Problem Relation Age of Onset  . Diabetes Father   . Hypertension Father   . Parkinson's disease Father   . Hypertension Mother   . Rheum arthritis Mother   . Atrial fibrillation Mother   . Heart failure Mother     Social History  Social History  . Marital Status: Single    Spouse Name: N/A  . Number of Children: N/A  . Years of Education: N/A   Social History Main Topics  . Smoking status: Never Smoker   . Smokeless tobacco: Never Used  . Alcohol Use: No  . Drug Use: No  . Sexual Activity: Not on file   Other Topics Concern  . Not on file   Social History Narrative    Review of Systems: A 12 point ROS discussed and pertinent positives are indicated in the HPI above.  All other systems are negative.  Review of Systems  Vital Signs: BP 115/97 mmHg  Pulse 102  Temp(Src) 98.4 F (36.9 C) (Oral)  Resp 22  SpO2 99%  LMP 07/17/2012  Physical Exam  Constitutional: She is oriented  to person, place, and time. She appears well-developed and well-nourished. No distress.  Morbidly obese   HENT:  Head: Normocephalic and atraumatic.  Eyes: No scleral icterus.  Cardiovascular: Regular rhythm and normal heart sounds.  Tachycardia present.   Pulmonary/Chest: Tachypnea noted. She is in respiratory distress.  Abdominal: Soft. There is no tenderness.  Neurological: She is alert and oriented to person, place, and time.  Skin: Skin is warm, dry and intact.  Psychiatric: She has a normal mood and affect.    Mallampati Score: 1  MD Evaluation Airway: WNL Heart: Other (comments) Heart  comments: tachy Abdomen: WNL Chest/ Lungs: WNL ASA  Classification: 2 Mallampati/Airway Score: One  Imaging: Dg Chest 2 View  10/18/2014   CLINICAL DATA:  Short of breath for 1 week.  EXAM: CHEST  2 VIEW  COMPARISON:  None.  FINDINGS: NORMAL CARDIAC SILHOUETTE. THERE IS FINE AIRSPACE DISEASE IN LEFT UPPER LOBE. No pleural fluid. No pneumothorax. Degenerative osteophytosis of the thoracic spine.  IMPRESSION: LEFT upper lobe pneumonia.   Electronically Signed   By: Suzy Bouchard M.D.   On: 10/18/2014 15:32   Ct Angio Chest Pe W/cm &/or Wo Cm  10/18/2014   CLINICAL DATA:  49 year old female with sudden onset shortness of breath. Possible left upper lung pneumonia on chest radiographs. Current history of breast cancer. Subsequent encounter.  EXAM: CT ANGIOGRAPHY CHEST WITH CONTRAST  TECHNIQUE: Multidetector CT imaging of the chest was performed using the standard protocol during bolus administration of intravenous contrast. Multiplanar CT image reconstructions and MIPs were obtained to evaluate the vascular anatomy.  CONTRAST:  33mL OMNIPAQUE IOHEXOL 350 MG/ML SOLN  COMPARISON:  Chest radiographs 1514 hr today.  FINDINGS: Good contrast bolus timing in the pulmonary arterial tree. Positive for bilateral acute pulmonary embolus. Distal main pulmonary artery thrombus bilaterally. Confluent segmental  thrombus greater on the right.  Left ventricular hypertrophy.  RV / LV ratio greater than 1.  Cardiomegaly. No pericardial effusion. No pleural effusion. Patchy and confluent left greater than right peribronchovascular opacity about the hila. Mild superimposed scattered ground-glass opacity, resembling mosaic attenuation in the lower lobes. Major airways are patent.  No mediastinal lymphadenopathy. Mild calcified aortic atherosclerosis. There is some evidence of coronary artery calcified plaque. No axillary lymphadenopathy. Negative visualized thoracic inlet.  Visualized liver, spleen, and bowel in the upper abdomen are within normal limits.  Degenerative changes in the thoracic spine. No acute or suspicious osseous lesion identified.  Review of the MIP images confirms the above findings.  IMPRESSION: 1. Positive for acute PE with CT evidence of right heart strain (RV/LV Ratio > 1) consistent with at least submassive (intermediate risk) PE. The presence of right heart  strain has been associated with an increased risk of morbidity and mortality. Please activate Code PE by paging [phone]. 2. Critical Value/emergent results were called by telephone at the time of interpretation on 10/18/2014 at 4:53 pm to Dr. Conni Slipper , who verbally acknowledged these results. 3. Patchy perihilar opacity is nonspecific, suspicious for infarction. No pleural or pericardial effusion.   Electronically Signed   By: Genevie Ann M.D.   On: 10/18/2014 16:55    Labs:  CBC:  Recent Labs  03/09/14 1533 10/18/14 1503 10/18/14 2146  WBC 10.1 17.7* 11.7*  HGB 11.1* 11.4* 10.9*  HCT 35.4 37.1 35.5*  PLT 217 199 172    COAGS:  Recent Labs  10/18/14 2146  INR 1.56*  APTT 71*    BMP:  Recent Labs  03/09/14 1533 10/18/14 1503 10/18/14 2146  NA 141 137 137  K 4.2 4.6 5.4*  CL 101 102 102  CO2 30 23 26   GLUCOSE 95 200* 128*  BUN 35* 26* 27*  CALCIUM 8.8 8.9 9.0  CREATININE 1.50* 1.48* 1.88*  GFRNONAA 39* 40*  30*  GFRAA 47* 46* 35*    LIVER FUNCTION TESTS:  Recent Labs  03/09/14 1533 10/18/14 1503 10/18/14 2146  BILITOT 0.3 0.9 0.6  AST 16 66* 305*  ALT 23 54 240*  ALKPHOS 113 88 76  PROT 7.3 6.8 6.5  ALBUMIN 3.5 3.2* 2.7*    TUMOR MARKERS: No results for input(s): AFPTM, CEA, CA199, CHROMGRNA in the last 8760 hours.  Assessment and Plan:  Acute submassive PE.  Evidence of right heart strain by both CT imaging (RV/LV 2.4) and elevated troponin c/w demand ischemia. Pt symptomatically dyspneic and there is no contraindication to catheter directed thrombolysis.  1.) To IR for bilateral ultrasound-assisted thrombolysis with EKOS.    Thank you for this interesting consult.  I greatly enjoyed meeting Nicole Schmidt and look forward to participating in their care.  A copy of this report was sent to the requesting provider on this date.  SignedJacqulynn Cadet 10/18/2014, 11:32 PM   I spent a total of 40 Minutes  in face to face in clinical consultation, greater than 50% of which was counseling/coordinating care for acute submassive PE.

## 2014-10-18 NOTE — ED Notes (Signed)
Pt coming from home with c/o SOB states she has had this for about a week today got worse. Pt states she went to the Doctor last week for a check up but states her breathing wasn't that bad then.

## 2014-10-18 NOTE — ED Notes (Signed)
Report given to Joy from [facility] all questions asked and answered

## 2014-10-18 NOTE — Progress Notes (Deleted)
[facility] Progress Note Patient Name: Nicole Schmidt DOB: 05-07-1965 MRN: [id number]   Date of Service  10/18/2014  HPI/Events of Note  ABG = 7.195/29.4/131/11.4  eICU Interventions  Will order NaHCO3 100 meq IV now.      Intervention Category Major Interventions: Acid-Base disturbance - evaluation and management;Shock - evaluation and management;Hypotension - evaluation and management  Lysle Dingwall 10/18/2014, 10:58 PM

## 2014-10-18 NOTE — Discharge Summary (Signed)
Patient will be transferred to [facility] for thrombolytics. She was never admitted to the floor. She remained in the emergency room. Further management as per Zacarias Pontes.

## 2014-10-18 NOTE — Sedation Documentation (Signed)
250cc NS bolus initiated

## 2014-10-18 NOTE — Progress Notes (Addendum)
CRITICAL VALUE ALERT  Critical value received:  Troponin 1.00  Date of notification:  10/18/14  Time of notification:  2300  Critical value read back: Yes  Nurse who received alert: Deboraha Sprang  MD notified (1st page):  Dr. Lamonte Sakai [phone] 10/18/14

## 2014-10-18 NOTE — Procedures (Signed)
Interventional Radiology Procedure Note  Procedure:  1.) Bilateral pulmonary arteriograms 2.) Placement of bilateral EKOS catheters and initiation of pulmonary artery thrombolysis  Main PA pressure 123XX123 (36) mmHg  Complications: 0  Estimated Blood Loss: 0  Recommendations: - Lyse per protocol 24 mg over 12 hrs - Q6 hr labs per protocol - Continue heparin  Signed,  Criselda Peaches, MD

## 2014-10-18 NOTE — ED Notes (Signed)
Call taken about pts Trop 0.43 MD aware

## 2014-10-18 NOTE — ED Provider Notes (Signed)
[facility] Emergency Department Provider Note  ____________________________________________  Time seen: Approximately 3:56 PM  I have reviewed the triage vital signs and the nursing notes.   HISTORY  Chief Complaint Shortness of Breath   HPI ELEXA LATZKE is a 49 y.o. female patient reports a dry cough and mild shortness of breath for approximately one week. However last night shortness of breath became much much worse. Patient denies any fever chills cough is nonproductive. Patient does not really have any chest pain. She gets very very short of breath with any exertion. Even standing up and didn't return and get her chest x-ray made her short of breath.   Past Medical History  Diagnosis Date  . Arthritis   . Menopause     age 68  . Hypertension   . Gout   . Thyroid goiter   . 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 Er/PR pos, her 2 negative  . Diabetes mellitus without complication   . Renal insufficiency   . Anemia   . Gout     Patient Active Problem List   Diagnosis Date Noted  . Sepsis 10/18/2014  . Pulmonary emboli 10/18/2014  . Candidiasis of breast 07/06/2014  . Malignant neoplasm of right female breast 07/24/2012    Past Surgical History  Procedure Laterality Date  . Breast surgery Right 2014    with sentinel node bx subareaolar duct excision  . Breast biopsy Right 2014    Current Outpatient Rx  Name  Route  Sig  Dispense  Refill  . allopurinol (ZYLOPRIM) 300 MG tablet   Oral   Take 1 tablet by mouth every morning.          Marland Kitchen amLODipine (NORVASC) 10 MG tablet   Oral   Take 1 tablet by mouth daily.         Francia Greaves THYROID 60 MG tablet   Oral   Take 2 tablets by mouth daily.         . Calcium Carbonate-Vitamin D (CALCIUM 600 + D PO)   Oral   Take 1 tablet by mouth daily.         Marland Kitchen CINNAMON PO   Oral   Take 2,000 mg by mouth daily.        . ergocalciferol (VITAMIN D2) 50000 UNITS capsule   Oral   Take 50,000 Units by mouth once a week. Take on Fridays.         . ferrous sulfate 325 (65 FE) MG tablet   Oral   Take 325 mg by mouth daily.         . folic acid (FOLVITE) Q000111Q MCG tablet   Oral   Take 800 mcg by mouth daily.          . furosemide (LASIX) 20 MG tablet   Oral   Take 1 tablet by mouth every morning.          Marland Kitchen lisinopril-hydrochlorothiazide (PRINZIDE,ZESTORETIC) 20-25 MG per tablet   Oral   Take 1 tablet by mouth daily.         . metFORMIN (GLUCOPHAGE) 1000 MG tablet   Oral   Take 1 tablet by mouth 2 (two) times daily.         Marland Kitchen nystatin (MYCOSTATIN/NYSTOP) 100000 UNIT/GM POWD      Apply topically to rash 3 times a day   30 g   1   .  tamoxifen (NOLVADEX) 20 MG tablet   Oral   Take 1 tablet by mouth daily.           Allergies Review of patient's allergies indicates no known allergies.  Family History  Problem Relation Age of Onset  . Diabetes Father   . Hypertension Father   . Parkinson's disease Father   . Hypertension Mother   . Rheum arthritis Mother   . Atrial fibrillation Mother   . Heart failure Mother     Social History Social History  Substance Use Topics  . Smoking status: Never Smoker   . Smokeless tobacco: Never Used  . Alcohol Use: No    Review of Systems Constitutional: No fever/chills Eyes: No visual changes. ENT: No sore throat. Cardiovascular: Denies chest pain. Respiratory: See history of present illness Gastrointestinal: No abdominal pain.  No nausea, no vomiting.  No diarrhea.  No constipation. Genitourinary: Negative for dysuria. Musculoskeletal: Negative for back pain. Skin: Negative for rash. Neurological: Negative for headaches, focal weakness or numbness.  10-point ROS otherwise negative.  ____________________________________________   PHYSICAL EXAM:  VITAL SIGNS: ED Triage Vitals  Enc Vitals Group     BP 10/18/14 1456  137/59 mmHg     Pulse Rate 10/18/14 1456 132     Resp 10/18/14 1456 27     Temp 10/18/14 1456 97.6 F (36.4 C)     Temp src --      SpO2 10/18/14 1456 83 %     Weight 10/18/14 1456 335 lb 1.6 oz (152 kg)     Height 10/18/14 1456 5\' 4"  (1.626 m)     Head Cir --      Peak Flow --      Pain Score --      Pain Loc --      Pain Edu? --      Excl. in [place]? --     Constitutional: Alert and oriented. Appears short of breath. Eyes: Conjunctivae are normal. PERRL. EOMI. Head: Atraumatic. Nose: No congestion/rhinnorhea. Mouth/Throat: Mucous membranes are moist.  Oropharynx non-erythematous. Neck: No stridor.   Cardiovascular: Normal rate, regular rhythm. Grossly normal heart sounds.  Good peripheral circulation. Respiratory: Increased respiratory effort.  No retractions. Lungs CTAB. Gastrointestinal: Soft and nontender. No distention. No abdominal bruits. No CVA tenderness. Musculoskeletal: No lower extremity tenderness nor edema.  No joint effusions. Neurologic:  Normal speech and language. No gross focal neurologic deficits are appreciated. No gait instability. Skin:  Skin is warm, dry and intact. No rash noted. Psychiatric: Mood and affect are normal. Speech and behavior are normal.  ____________________________________________   LABS (all labs ordered are listed, but only abnormal results are displayed)  Labs Reviewed  COMPREHENSIVE METABOLIC PANEL - Abnormal; Notable for the following:    Glucose, Bld 200 (*)    BUN 26 (*)    Creatinine, Ser 1.48 (*)    Albumin 3.2 (*)    AST 66 (*)    GFR calc non Af Amer 40 (*)    GFR calc Af Amer 46 (*)    All other components within normal limits  BRAIN NATRIURETIC PEPTIDE - Abnormal; Notable for the following:    B Natriuretic Peptide 173.0 (*)    All other components within normal limits  TROPONIN I - Abnormal; Notable for the following:    Troponin I 0.43 (*)    All other components within normal limits  CBC WITH  DIFFERENTIAL/PLATELET - Abnormal; Notable for the following:    WBC 17.7 (*)  Hemoglobin 11.4 (*)    MCHC 30.7 (*)    RDW 17.1 (*)    Neutro Abs 14.9 (*)    Monocytes Absolute 1.5 (*)    All other components within normal limits  CULTURE, BLOOD (ROUTINE X 2)  CULTURE, BLOOD (ROUTINE X 2)  TROPONIN I  TROPONIN I  TROPONIN I  ANTITHROMBIN III  PROTEIN C ACTIVITY  PROTEIN C, TOTAL  PROTEIN S ACTIVITY  PROTEIN S, TOTAL  LUPUS ANTICOAGULANT PANEL  BETA-2-GLYCOPROTEIN I ABS, IGG/M/A  HOMOCYSTEINE  FACTOR 5 LEIDEN  PROTHROMBIN GENE MUTATION  CARDIOLIPIN ANTIBODIES, IGG, IGM, IGA   ____________________________________________  EKG  EKG shows sinus tach at a rate of 124. Normal axis. There is a big S wave in lead 1 and a Q-wave and inverted T-wave in lead 3 that the lack of any fever and a nonproductive numbness of the cough and history of recent breast cancer make me worried about a PE ____________________________________________  RADIOLOGY  Radiology reads chest x-ray is left upper lobe pneumonia or infiltrate. However because of the reasons I mentioned above I'm still worried about a PE and I will CT her chest to make sure. ____________________________________________   PROCEDURES    ____________________________________________   INITIAL IMPRESSION / ASSESSMENT AND PLAN / ED COURSE  Pertinent labs & imaging results that were available during my care of the patient were reviewed by me and considered in my medical decision making (see chart for details). Radiology calls with the CT reports that her multiple PEs and right heart strain suggested implementing code PE. Stressed this with Dr. Renita Papa APHA MR vascular surgeon on call who says he does not do tPA for PEs that is beyond his training. I then called the code PE beeper and discussed the this with the doctor there Dr. Ola Spurr OUB will be the accepting physician. My discussion with Dr. Patrice Paradise was that the patient was  tachycardic list last blood pressure is 100/65 patient's getting fluid she hadn't actually had any up to this point yet he feels she is stable enough to get the catheter guided tPA. That will be done at [facility] also discussed with Dr. Genia Harold the hospitalist on call here in the meantime before transfer will start heparin bolus and drip and maintain her on that until she gets to [facility] critical care time 45 minutes  ____________________________________________   FINAL CLINICAL IMPRESSION(S) / ED DIAGNOSES  Final diagnoses:  Pulmonary embolism      Nena Polio, MD 10/18/14 1727

## 2014-10-18 NOTE — Consult Note (Signed)
ANTICOAGULATION CONSULT NOTE - Initial Consult  Pharmacy Consult for heparin Indication: pulmonary embolus  No Known Allergies  Patient Measurements: Height: 5\' 4"  (162.6 cm) Weight: (!) 335 lb 1.6 oz (152 kg) IBW/kg (Calculated) : 54.7 Heparin Dosing Weight: 93.5    Vital Signs: Temp: 98 F (36.7 C) (09/04 1758) BP: 121/90 mmHg (09/04 1758) Pulse Rate: 114 (09/04 1758)  Labs:  Recent Labs  10/18/14 1503 10/18/14 1715  HGB 11.4*  --   HCT 37.1  --   PLT 199  --   CREATININE 1.48*  --   TROPONINI 0.43* 0.68*    Estimated Creatinine Clearance: 66.4 mL/min (by C-G formula based on Cr of 1.48).   Medical History: Past Medical History  Diagnosis Date  . Arthritis   . Menopause     age 35  . Hypertension   . Gout   . Thyroid goiter   . 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 Er/PR pos, her 2 negative  . Diabetes mellitus without complication   . Renal insufficiency   . Anemia   . Gout     Medications:  Scheduled:  . heparin  5,650 Units Intravenous Once    Assessment: Pt is a 49 year old female with a PE. Pt is to be transferred to [facility], Will start heparin drip prior to leaving Goal of Therapy:  Heparin level 0.3-0.7 units/ml Monitor platelets by anticoagulation protocol: Yes   Plan:  Give 5650 units bolus x 1 Start heparin infusion at 1600 units/hr Check anti-Xa level in 6 hours and daily while on heparin Continue to monitor H&H and platelets  Melissa D Maccia 10/18/2014,6:11 PM

## 2014-10-18 NOTE — H&P (Signed)
PULMONARY / CRITICAL CARE MEDICINE   Name: Nicole Schmidt MRN: [id number] DOB: 1965/08/22    ADMISSION DATE:  10/18/2014   REFERRING MD :  [facility]  CHIEF COMPLAINT:  PE  INITIAL PRESENTATION: Shortness of breath   STUDIES:  CTA 10/18/14  SIGNIFICANT EVENTS: CTA showed bilateral submassive PE on 10/18/14.  Transferred to [facility] for catheter directed lytic therapy.   HISTORY OF PRESENT ILLNESS:  Nicole Schmidt is a 49 y/o woman with past medical history of breast cancer treated with lumpectomy (right sided), lymph node dissection and radiation therapy 2 years ago.  She has been on tamoxifen since then.  She also has HTN, DM2 and hypothyroidism.  She has had a slight cough and mild shortness of breath for about a week.  Immediately prior to going to the emergency room she developed severe shortness of breath and lightheadedness.  She felt like she might pass out every time she tried to move although she never syncopized.  She underwent CTA in the ER which showed large bilateral PEs with evidence of right heart strain.  She was admitted and transferred to [facility] for possible catheter directed lytic therapy.   PAST MEDICAL HISTORY :   has a past medical history of Arthritis; Menopause; Hypertension; Gout; Thyroid goiter; Breast cancer of upper-inner quadrant of right female breast; Diabetes mellitus without complication; Renal insufficiency; Anemia; and Gout.  has past surgical history that includes Breast surgery (Right, 2014) and Breast biopsy (Right, 2014). Prior to Admission medications   Medication Sig Start Date End Date Taking? Authorizing Provider  allopurinol (ZYLOPRIM) 300 MG tablet Take 1 tablet by mouth every morning.  04/10/12   Historical Provider, MD  amLODipine (NORVASC) 10 MG tablet Take 1 tablet by mouth daily. 06/17/12   Historical Provider, MD  ARMOUR THYROID 60 MG tablet Take 2 tablets by mouth daily. 07/03/12   Historical Provider, MD  Calcium Carbonate-Vitamin D (CALCIUM 600 + D PO)  Take 1 tablet by mouth daily.    Historical Provider, MD  CINNAMON PO Take 2,000 mg by mouth daily.    Historical Provider, MD  ergocalciferol (VITAMIN D2) 50000 UNITS capsule Take 50,000 Units by mouth once a week. Take on Fridays.    Historical Provider, MD  ferrous sulfate 325 (65 FE) MG tablet Take 325 mg by mouth daily.    Historical Provider, MD  folic acid (FOLVITE) Q000111Q MCG tablet Take 800 mcg by mouth daily.     Historical Provider, MD  furosemide (LASIX) 20 MG tablet Take 1 tablet by mouth every morning.  04/25/12   Historical Provider, MD  lisinopril-hydrochlorothiazide (PRINZIDE,ZESTORETIC) 20-25 MG per tablet Take 1 tablet by mouth daily. 07/03/12   Historical Provider, MD  metFORMIN (GLUCOPHAGE) 1000 MG tablet Take 1 tablet by mouth 2 (two) times daily. 07/03/12   Historical Provider, MD  nystatin (MYCOSTATIN/NYSTOP) 100000 UNIT/GM POWD Apply topically to rash 3 times a day 07/06/14   Lequita Asal, MD  tamoxifen (NOLVADEX) 20 MG tablet Take 1 tablet by mouth daily. 01/14/13   Historical Provider, MD   No Known Allergies  FAMILY HISTORY:  has no family status information on file.  SOCIAL HISTORY:  reports that she has never smoked. She has never used smokeless tobacco. She reports that she does not drink alcohol or use illicit drugs.  REVIEW OF SYSTEMS:  A review of 14 systems was negative except as stated in the HPI.   SUBJECTIVE:   VITAL SIGNS: Temp:  [97.6 F (36.4 C)-98.4  F (36.9 C)] 98.4 F (36.9 C) (09/04 2006) Pulse Rate:  [113-132] 114 (09/04 1758) Resp:  [21-27] 21 (09/04 1758) BP: (85-137)/(58-90) 121/90 mmHg (09/04 1758) SpO2:  [83 %-100 %] 100 % (09/04 1758) Weight:  [152 kg (335 lb 1.6 oz)] 152 kg (335 lb 1.6 oz) (09/04 1456) HEMODYNAMICS:      INTAKE / OUTPUT: No intake or output data in the 24 hours ending 10/18/14 2123  PHYSICAL EXAMINATION: Physical Exam  Constitutional: She is oriented to person, place, and time. She appears well-developed and  well-nourished.  HENT:  Head: Normocephalic and atraumatic.  Eyes: Conjunctivae and EOM are normal. Pupils are equal, round, and reactive to light.  Neck: Normal range of motion. Neck supple.  Cardiovascular: Normal rate, regular rhythm, normal heart sounds and intact distal pulses.   Pulmonary/Chest: Effort normal and breath sounds normal. No respiratory distress.  Abdominal: Soft. Bowel sounds are normal.  Musculoskeletal: Normal range of motion. She exhibits edema.  Neurological: She is alert and oriented to person, place, and time. She has normal reflexes.  Skin: Skin is warm and dry.  Psychiatric: She has a normal mood and affect. Her behavior is normal.     LABS:  CBC  Recent Labs Lab 10/18/14 1503  WBC 17.7*  HGB 11.4*  HCT 37.1  PLT 199   Coag's No results for input(s): APTT, INR in the last 168 hours. BMET  Recent Labs Lab 10/18/14 1503  NA 137  K 4.6  CL 102  CO2 23  BUN 26*  CREATININE 1.48*  GLUCOSE 200*   Electrolytes  Recent Labs Lab 10/18/14 1503  CALCIUM 8.9   Sepsis Markers No results for input(s): LATICACIDVEN, PROCALCITON, O2SATVEN in the last 168 hours. ABG No results for input(s): PHART, PCO2ART, PO2ART in the last 168 hours. Liver Enzymes  Recent Labs Lab 10/18/14 1503  AST 66*  ALT 54  ALKPHOS 88  BILITOT 0.9  ALBUMIN 3.2*   Cardiac Enzymes  Recent Labs Lab 10/18/14 1503 10/18/14 1715  TROPONINI 0.43* 0.68*   Glucose  Recent Labs Lab 10/18/14 2005  GLUCAP 145*    Imaging Dg Chest 2 View  10/18/2014   CLINICAL DATA:  Short of breath for 1 week.  EXAM: CHEST  2 VIEW  COMPARISON:  None.  FINDINGS: NORMAL CARDIAC SILHOUETTE. THERE IS FINE AIRSPACE DISEASE IN LEFT UPPER LOBE. No pleural fluid. No pneumothorax. Degenerative osteophytosis of the thoracic spine.  IMPRESSION: LEFT upper lobe pneumonia.   Electronically Signed   By: Suzy Bouchard M.D.   On: 10/18/2014 15:32   Ct Angio Chest Pe W/cm &/or Wo  Cm  10/18/2014   CLINICAL DATA:  49 year old female with sudden onset shortness of breath. Possible left upper lung pneumonia on chest radiographs. Current history of breast cancer. Subsequent encounter.  EXAM: CT ANGIOGRAPHY CHEST WITH CONTRAST  TECHNIQUE: Multidetector CT imaging of the chest was performed using the standard protocol during bolus administration of intravenous contrast. Multiplanar CT image reconstructions and MIPs were obtained to evaluate the vascular anatomy.  CONTRAST:  32mL OMNIPAQUE IOHEXOL 350 MG/ML SOLN  COMPARISON:  Chest radiographs 1514 hr today.  FINDINGS: Good contrast bolus timing in the pulmonary arterial tree. Positive for bilateral acute pulmonary embolus. Distal main pulmonary artery thrombus bilaterally. Confluent segmental thrombus greater on the right.  Left ventricular hypertrophy.  RV / LV ratio greater than 1.  Cardiomegaly. No pericardial effusion. No pleural effusion. Patchy and confluent left greater than right peribronchovascular opacity about the hila. Mild  superimposed scattered ground-glass opacity, resembling mosaic attenuation in the lower lobes. Major airways are patent.  No mediastinal lymphadenopathy. Mild calcified aortic atherosclerosis. There is some evidence of coronary artery calcified plaque. No axillary lymphadenopathy. Negative visualized thoracic inlet.  Visualized liver, spleen, and bowel in the upper abdomen are within normal limits.  Degenerative changes in the thoracic spine. No acute or suspicious osseous lesion identified.  Review of the MIP images confirms the above findings.  IMPRESSION: 1. Positive for acute PE with CT evidence of right heart strain (RV/LV Ratio > 1) consistent with at least submassive (intermediate risk) PE. The presence of right heart strain has been associated with an increased risk of morbidity and mortality. Please activate Code PE by paging [phone]. 2. Critical Value/emergent results were called by telephone at the  time of interpretation on 10/18/2014 at 4:53 pm to Dr. Conni Slipper , who verbally acknowledged these results. 3. Patchy perihilar opacity is nonspecific, suspicious for infarction. No pleural or pericardial effusion.   Electronically Signed   By: Genevie Ann M.D.   On: 10/18/2014 16:55     ASSESSMENT / PLAN:  PULMONARY A: PE P:   Currently on heparin.  VIR consulted for catheter directed lytic therapy  CARDIOVASCULAR A: Elevated troponin P:  Right heart strain on CT Tropnin likely secondary to PE Continue to trend until down-trending.   RENAL A:  CRF P:   Baseline creatinine appears to be 1.4-1.5 Pt is on lisinopril and metformin.   HEMATOLOGIC A:  PE P:  No previous history of clot  Hypercoagulable work up pending.   ENDOCRINE A:  Hypothyroidism, DM2   P:   On metformin and synthroid Hold metformin, start SSI Home synthroid dose     FAMILY  - Updates: Family at bedside  - Inter-disciplinary family meet or Palliative Care meeting due by:  day 7    TODAY'S SUMMARY: Submassive PE      Pulmonary and [place] Pager: [phone]   Reginia Forts, MD, PhD   10/18/2014, 9:23 PM

## 2014-10-18 NOTE — H&P (Signed)
Nicole Schmidt at [place] NAME: Nicole Schmidt    MR#:  [id number]  DATE OF BIRTH:  [birth year]  DATE OF ADMISSION:  10/18/2014  PRIMARY CARE PHYSICIAN: Lenard Simmer, MD   REQUESTING/REFERRING PHYSICIAN: Dr Cinda Quest  CHIEF COMPLAINT:  Shortness of breath HISTORY OF PRESENT ILLNESS:  Nicole Schmidt  is a 49 y.o. female with a known history of diabetes, breast cancer on tamoxifen and essential hypertension presents with above complaint. Over the past week patient has had increasing shortness of breath and cough. She is not been feeling well. In the emergency room a chest x-ray showed left upper lobe pneumonia. She was started on Rocephin and azithromycin. She presented with tachycardia and leukocytosis as well. She underwent a CT which shows bilateral pulmonary emboli with right heart strain. ER physician has spoken with pulmonary about possible thrombolytics.  PAST MEDICAL HISTORY:   Past Medical History  Diagnosis Date  . Arthritis   . Menopause     age 57  . Hypertension   . Gout   . Thyroid goiter   . 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 Er/PR pos, her 2 negative  . Diabetes mellitus without complication   . Renal insufficiency   . Anemia   . Gout     PAST SURGICAL HISTORY:   Past Surgical History  Procedure Laterality Date  . Breast surgery Right 2014    with sentinel node bx subareaolar duct excision  . Breast biopsy Right 2014    SOCIAL HISTORY:   Social History  Substance Use Topics  . Smoking status: Never Smoker   . Smokeless tobacco: Never Used  . Alcohol Use: No    FAMILY HISTORY:   Family History  Problem Relation Age of Onset  . Diabetes Father   . Hypertension Father   . Parkinson's disease Father   . Hypertension Mother   . Rheum arthritis Mother   . Atrial fibrillation Mother   . Heart failure Mother     DRUG ALLERGIES:   No Known Allergies   REVIEW OF SYSTEMS:  CONSTITUTIONAL: ++ fever, fatigue or weakness.  EYES: No blurred or double vision.  EARS, NOSE, AND THROAT: No tinnitus or ear pain.  RESPIRATORY: ++ cough, shortness of breath, NO wheezing or hemoptysis.  CARDIOVASCULAR: No chest pain, orthopnea, edema.  GASTROINTESTINAL: No nausea, vomiting, diarrhea or abdominal pain.  GENITOURINARY: No dysuria, hematuria.  ENDOCRINE: No polyuria, nocturia,  HEMATOLOGY: No anemia, easy bruising or bleeding SKIN: No rash or lesion. MUSCULOSKELETAL: No joint pain or arthritis.   NEUROLOGIC: No tingling, numbness, weakness.  PSYCHIATRY: No anxiety or depression.   MEDICATIONS AT HOME:   Prior to Admission medications   Medication Sig Start Date End Date Taking? Authorizing Provider  allopurinol (ZYLOPRIM) 300 MG tablet Take 1 tablet by mouth daily. 04/10/12   Historical Provider, MD  amLODipine (NORVASC) 10 MG tablet Take 1 tablet by mouth daily. 06/17/12   Historical Provider, MD  ARMOUR THYROID 60 MG tablet Take 2 tablets by mouth daily. 07/03/12   Historical Provider, MD  Calcium Carbonate-Vitamin D (CALCIUM 600 + D PO) Take 1 tablet by mouth daily.    Historical Provider, MD  Chromium-Cinnamon [phone] MCG-MG CAPS Take 2 capsules by mouth daily.    Historical Provider, MD  ergocalciferol (VITAMIN D2) 50000 UNITS capsule Take 50,000 Units by mouth once a week.    Historical  Provider, MD  folic acid (FOLVITE) Q000111Q MCG tablet Take 400 mcg by mouth daily.    Historical Provider, MD  furosemide (LASIX) 20 MG tablet Take 1 tablet by mouth daily. 04/25/12   Historical Provider, MD  lisinopril-hydrochlorothiazide (PRINZIDE,ZESTORETIC) 20-25 MG per tablet Take 1 tablet by mouth daily. 07/03/12   Historical Provider, MD  metFORMIN (GLUCOPHAGE) 1000 MG tablet Take 1 tablet by mouth 2 (two) times daily. 07/03/12   Historical Provider, MD  NOVOFINE 30G X 8 MM MISC  05/27/14   Historical Provider, MD  nystatin  (MYCOSTATIN/NYSTOP) 100000 UNIT/GM POWD Apply topically to rash 3 times a day 07/06/14   Lequita Asal, MD  ONE TOUCH ULTRA TEST test strip  05/27/14   Historical Provider, MD  [name]  05/27/14   Historical Provider, MD  tamoxifen (NOLVADEX) 20 MG tablet Take 1 tablet by mouth daily. 01/14/13   Historical Provider, MD      VITAL SIGNS:  Blood pressure 137/59, pulse 132, temperature 97.6 F (36.4 C), resp. rate 27, height 5\' 4"  (1.626 m), weight 152 kg (335 lb 1.6 oz), last menstrual period 07/17/2012, SpO2 83 %.  PHYSICAL EXAMINATION:  GENERAL:  49 y.o.-year-old patient lying in the bed with no acute distress.  EYES: Pupils equal, round, reactive to light and accommodation. No scleral icterus. Extraocular muscles intact.  HEENT: Head atraumatic, normocephalic. Oropharynx and nasopharynx clear.  NECK:  Supple, no jugular venous distention. No thyroid enlargement, no tenderness.  LUNGS:crackles left upper lung, no wheezing, rales,rhonchi or crepitation. No use of accessory muscles of respiration.  CARDIOVASCULAR: S1, S2 normal. No murmurs, rubs, or gallops.  ABDOMEN: Soft, nontender, nondistended. Bowel sounds present. No organomegaly or mass.  EXTREMITIES: No pedal edema, cyanosis, or clubbing.  NEUROLOGIC: Cranial nerves II through XII are grossly intact. No focal deficits. PSYCHIATRIC: The patient is alert and oriented x 3.  SKIN: No obvious rash, lesion, or ulcer.   LABORATORY PANEL:   CBC  Recent Labs Lab 10/18/14 1503  WBC 17.7*  HGB 11.4*  HCT 37.1  PLT 199   ------------------------------------------------------------------------------------------------------------------  Chemistries   Recent Labs Lab 10/18/14 1503  NA 137  K 4.6  CL 102  CO2 23  GLUCOSE 200*  BUN 26*  CREATININE 1.48*  CALCIUM 8.9  AST 66*  ALT 54  ALKPHOS 88  BILITOT 0.9    ------------------------------------------------------------------------------------------------------------------  Cardiac Enzymes  Recent Labs Lab 10/18/14 1503  TROPONINI 0.43*   ------------------------------------------------------------------------------------------------------------------  RADIOLOGY:  Dg Chest 2 View  10/18/2014   CLINICAL DATA:  Short of breath for 1 week.  EXAM: CHEST  2 VIEW  COMPARISON:  None.  FINDINGS: NORMAL CARDIAC SILHOUETTE. THERE IS FINE AIRSPACE DISEASE IN LEFT UPPER LOBE. No pleural fluid. No pneumothorax. Degenerative osteophytosis of the thoracic spine.  IMPRESSION: LEFT upper lobe pneumonia.   Electronically Signed   By: Suzy Bouchard M.D.   On: 10/18/2014 15:32    EKG:   Sinus tachycardia no ST elevation or depression with PACs  IMPRESSION AND PLAN:  49 year old female with a history of diabetes and breast cancer who presents with shortness of breath and found to have community-acquired pneumonia and pulmonary emboli with right heart strain  1. Acute hypoxic respiratory failure: Her CT chest shows bilateral pulmonary emboli with right heart strain. I have reviewed side effects, alternatives, risks and benefits of thrombolytics and anticoagulation with the patient. She accepts these risks. Dr. Cinda Quest is peaking with pulmonologist about possible thrombolytics. Her blood pressure is  low normal and  she remains tachycardic. She does have elevation in her troponin level. I have also ordered hypercoagulable panel.I will continue IVF I will also order lower extremity Doppler and VASCULAR SURGERY consult.  2.Sepsis present on admission: Patient presents with tachycardia and leukocytosis. Sepsis is secondary to community-acquired pneumonia. Blood cultures were ordered in the emergency room. Patient was started on Rocephin and azithromycin.  3.. Community-acquired pneumonia: Patient will be continued on antibiotics. I will start Levaquin. Please  follow up on blood cultures.  4. History of breast cancer: Patient's currently on tamoxifen..  5. Elevated troponin: I suspect her elevation in troponin secondary to right heart strain from her pulmonary emboli. We will continue to monitor troponins. I will order 2-D echocardiogram for the a.m.  6. Diabetes mellitus without complication: Continue sliding scale insulin and ADA diet. Continue to monitor blood sugar level every before meals and daily at bedtime.  7. Breast cancer: Patient will continue on tamoxifen  8. Essential hypertension: Continue outpatient medications including Norvasc and lisinopril/HCTZ.. 9. Hypothyroidism: Continue Armour thyroid.  10. Acute kidney injury: Likely secondary to dehydration secondary to sepsis and pneumonia. Patient will be placed on IV fluids and I will repeat a BMP in a.m.   All the records are reviewed and case discussed with ED provider. Management plans discussed with the patient and she is in agreement.  CODE STATUS: FULL  CRITICAL CARE TOTAL TIME TAKING CARE OF THIS PATIENT: 55 minutes.    Doloras Tellado M.D on 10/18/2014 at 4:45 PM  Between 7am to 6pm - Pager - [phone] After 6pm go to [email or web address] - password [email or web address] [facility]  Office  [phone]  CC: Primary care physician; Lenard Simmer, MD

## 2014-10-19 ENCOUNTER — Inpatient Hospital Stay (HOSPITAL_COMMUNITY): Payer: BLUE CROSS/BLUE SHIELD

## 2014-10-19 DIAGNOSIS — R0902 Hypoxemia: Secondary | ICD-10-CM

## 2014-10-19 DIAGNOSIS — E875 Hyperkalemia: Secondary | ICD-10-CM

## 2014-10-19 DIAGNOSIS — R579 Shock, unspecified: Secondary | ICD-10-CM

## 2014-10-19 LAB — CBC
HCT: 34 % — ABNORMAL LOW (ref 36.0–46.0)
HCT: 32.8 % — ABNORMAL LOW (ref 36.0–46.0)
HCT: 31.6 % — ABNORMAL LOW (ref 36.0–46.0)
HCT: 31.6 % — ABNORMAL LOW (ref 36.0–46.0)
HCT: 33.2 % — ABNORMAL LOW (ref 36.0–46.0)
Hemoglobin: 10.5 g/dL — ABNORMAL LOW (ref 12.0–15.0)
Hemoglobin: 10.2 g/dL — ABNORMAL LOW (ref 12.0–15.0)
Hemoglobin: 9.9 g/dL — ABNORMAL LOW (ref 12.0–15.0)
Hemoglobin: 9.8 g/dL — ABNORMAL LOW (ref 12.0–15.0)
Hemoglobin: 10.3 g/dL — ABNORMAL LOW (ref 12.0–15.0)
MCH: 27.1 pg (ref 26.0–34.0)
MCH: 27.3 pg (ref 26.0–34.0)
MCH: 27.7 pg (ref 26.0–34.0)
MCH: 27.4 pg (ref 26.0–34.0)
MCH: 27.5 pg (ref 26.0–34.0)
MCHC: 30.9 g/dL (ref 30.0–36.0)
MCHC: 31.1 g/dL (ref 30.0–36.0)
MCHC: 31.3 g/dL (ref 30.0–36.0)
MCHC: 31 g/dL (ref 30.0–36.0)
MCHC: 31 g/dL (ref 30.0–36.0)
MCV: 87.9 fL (ref 78.0–100.0)
MCV: 87.9 fL (ref 78.0–100.0)
MCV: 88.5 fL (ref 78.0–100.0)
MCV: 88.3 fL (ref 78.0–100.0)
MCV: 88.5 fL (ref 78.0–100.0)
Platelets: 161 10*3/uL (ref 150–400)
Platelets: 156 10*3/uL (ref 150–400)
Platelets: 149 10*3/uL — ABNORMAL LOW (ref 150–400)
Platelets: 148 10*3/uL — ABNORMAL LOW (ref 150–400)
Platelets: 159 10*3/uL (ref 150–400)
RBC: 3.87 MIL/uL (ref 3.87–5.11)
RBC: 3.73 MIL/uL — ABNORMAL LOW (ref 3.87–5.11)
RBC: 3.57 MIL/uL — ABNORMAL LOW (ref 3.87–5.11)
RBC: 3.58 MIL/uL — ABNORMAL LOW (ref 3.87–5.11)
RBC: 3.75 MIL/uL — ABNORMAL LOW (ref 3.87–5.11)
RDW: 16.2 % — ABNORMAL HIGH (ref 11.5–15.5)
RDW: 16.3 % — ABNORMAL HIGH (ref 11.5–15.5)
RDW: 16 % — ABNORMAL HIGH (ref 11.5–15.5)
RDW: 16 % — ABNORMAL HIGH (ref 11.5–15.5)
RDW: 16 % — ABNORMAL HIGH (ref 11.5–15.5)
WBC: 10.6 10*3/uL — ABNORMAL HIGH (ref 4.0–10.5)
WBC: 11.6 10*3/uL — ABNORMAL HIGH (ref 4.0–10.5)
WBC: 10.1 10*3/uL (ref 4.0–10.5)
WBC: 9.1 10*3/uL (ref 4.0–10.5)
WBC: 9.1 10*3/uL (ref 4.0–10.5)

## 2014-10-19 LAB — GLUCOSE, CAPILLARY
Glucose-Capillary: 102 mg/dL — ABNORMAL HIGH (ref 65–99)
Glucose-Capillary: 102 mg/dL — ABNORMAL HIGH (ref 65–99)
Glucose-Capillary: 105 mg/dL — ABNORMAL HIGH (ref 65–99)
Glucose-Capillary: 106 mg/dL — ABNORMAL HIGH (ref 65–99)
Glucose-Capillary: 136 mg/dL — ABNORMAL HIGH (ref 65–99)
Glucose-Capillary: 84 mg/dL (ref 65–99)
Glucose-Capillary: 94 mg/dL (ref 65–99)

## 2014-10-19 LAB — HEPARIN LEVEL (UNFRACTIONATED)
Heparin Unfractionated: 0.3 IU/mL (ref 0.30–0.70)
Heparin Unfractionated: 0.34 IU/mL (ref 0.30–0.70)
Heparin Unfractionated: 0.3 [IU]/mL (ref 0.30–0.70)

## 2014-10-19 LAB — BASIC METABOLIC PANEL
Anion gap: 9 (ref 5–15)
Anion gap: 9 (ref 5–15)
Anion gap: 7 (ref 5–15)
BUN: 30 mg/dL — ABNORMAL HIGH (ref 6–20)
BUN: 32 mg/dL — ABNORMAL HIGH (ref 6–20)
BUN: 31 mg/dL — ABNORMAL HIGH (ref 6–20)
CO2: 24 mmol/L (ref 22–32)
CO2: 24 mmol/L (ref 22–32)
CO2: 26 mmol/L (ref 22–32)
Calcium: 8.2 mg/dL — ABNORMAL LOW (ref 8.9–10.3)
Calcium: 8 mg/dL — ABNORMAL LOW (ref 8.9–10.3)
Calcium: 8.3 mg/dL — ABNORMAL LOW (ref 8.9–10.3)
Chloride: 104 mmol/L (ref 101–111)
Chloride: 103 mmol/L (ref 101–111)
Chloride: 105 mmol/L (ref 101–111)
Creatinine, Ser: 2.31 mg/dL — ABNORMAL HIGH (ref 0.44–1.00)
Creatinine, Ser: 2.32 mg/dL — ABNORMAL HIGH (ref 0.44–1.00)
Creatinine, Ser: 1.88 mg/dL — ABNORMAL HIGH (ref 0.44–1.00)
GFR calc Af Amer: 27 mL/min — ABNORMAL LOW (ref >60)
GFR calc Af Amer: 27 mL/min — ABNORMAL LOW (ref >60)
GFR calc Af Amer: 35 mL/min — ABNORMAL LOW (ref >60)
GFR calc non Af Amer: 23 mL/min — ABNORMAL LOW (ref >60)
GFR calc non Af Amer: 23 mL/min — ABNORMAL LOW (ref >60)
GFR calc non Af Amer: 30 mL/min — ABNORMAL LOW (ref >60)
Glucose, Bld: 123 mg/dL — ABNORMAL HIGH (ref 65–99)
Glucose, Bld: 103 mg/dL — ABNORMAL HIGH (ref 65–99)
Glucose, Bld: 119 mg/dL — ABNORMAL HIGH (ref 65–99)
Potassium: 6 mmol/L — ABNORMAL HIGH (ref 3.5–5.1)
Potassium: 5.5 mmol/L — ABNORMAL HIGH (ref 3.5–5.1)
Potassium: 4.4 mmol/L (ref 3.5–5.1)
Sodium: 137 mmol/L (ref 135–145)
Sodium: 136 mmol/L (ref 135–145)
Sodium: 138 mmol/L (ref 135–145)

## 2014-10-19 LAB — FIBRINOGEN
Fibrinogen: 467 mg/dL (ref 204–475)
Fibrinogen: 454 mg/dL (ref 204–475)
Fibrinogen: 470 mg/dL (ref 204–475)
Fibrinogen: 500 mg/dL — ABNORMAL HIGH (ref 204–475)

## 2014-10-19 LAB — ANTITHROMBIN III: AntiThromb III Func: 82 % (ref 75–120)

## 2014-10-19 LAB — TROPONIN I
Troponin I: 1 ng/mL (ref <0.031)
Troponin I: 0.64 ng/mL (ref <0.031)

## 2014-10-19 LAB — ABO/RH: ABO/RH(D): O NEG

## 2014-10-19 MED ORDER — ONDANSETRON HCL 4 MG/2ML IJ SOLN
4.0000 mg | Freq: Three times a day (TID) | INTRAMUSCULAR | Status: DC | PRN
  Administered 2014-10-19: 4 mg via INTRAVENOUS
  Filled 2014-10-19: qty 2

## 2014-10-19 MED ORDER — INFLUENZA VAC SPLIT QUAD 0.5 ML IM SUSY: 0.5000 mL | PREFILLED_SYRINGE | INTRAMUSCULAR | Status: DC | PRN

## 2014-10-19 MED ORDER — SODIUM CHLORIDE 0.9 % IV BOLUS (SEPSIS)
500.0000 mL | Freq: Once | INTRAVENOUS | Status: AC
  Administered 2014-10-19: 500 mL via INTRAVENOUS

## 2014-10-19 MED ORDER — SODIUM CHLORIDE 0.9 % IV SOLN
INTRAVENOUS | Status: DC
  Administered 2014-10-19 – 2014-10-20 (×2): via INTRAVENOUS

## 2014-10-19 MED ORDER — CETYLPYRIDINIUM CHLORIDE 0.05 % MT LIQD
7.0000 mL | Freq: Two times a day (BID) | OROMUCOSAL | Status: DC
  Administered 2014-10-19 (×3): 7 mL via OROMUCOSAL

## 2014-10-19 MED ORDER — SODIUM POLYSTYRENE SULFONATE 15 GM/60ML PO SUSP
60.0000 g | Freq: Once | ORAL | Status: AC
  Administered 2014-10-19: 60 g via ORAL
  Filled 2014-10-19: qty 240

## 2014-10-19 NOTE — Sedation Documentation (Signed)
Patient is resting comfortably. 

## 2014-10-19 NOTE — Progress Notes (Signed)
[place] Progress Note Patient Name: Nicole Schmidt DOB: [birth year] MRN: [id number]   Date of Service  10/19/2014  HPI/Events of Note  BP improved but remains oliguric. S Cr rising.   eICU Interventions  Will give another bolus 500cc NS and then follow     Intervention Category Intermediate Interventions: Oliguria - evaluation and management  Nemesio Castrillon S. 10/19/2014, 4:49 AM

## 2014-10-19 NOTE — Progress Notes (Signed)
[place] Progress Note Patient Name: Nicole Schmidt DOB: 01-15-1966 MRN: [id number]   Date of Service  10/19/2014  HPI/Events of Note  Hypotension after retyurning from EKOS initiation, marginal UOP  eICU Interventions  NS bolus now and follow. Unclear whether her BP cuff is giving accurate reading due to positioning     Intervention Category Intermediate Interventions: Hypotension - evaluation and management  BYRUM,ROBERT S. 10/19/2014, 1:19 AM

## 2014-10-19 NOTE — H&P (Signed)
PULMONARY / CRITICAL CARE MEDICINE   Name: Nicole Schmidt MRN: [id number] DOB: [birth year]    ADMISSION DATE:  10/18/2014   REFERRING MD :  Nicole Schmidt ED  CHIEF COMPLAINT:  PE  INITIAL PRESENTATION: Shortness of breath   STUDIES:  CTA 10/18/14  SIGNIFICANT EVENTS: CTA showed bilateral submassive PE on 10/18/14.  Transferred to [facility] for catheter directed lytic therapy.   HISTORY OF PRESENT ILLNESS:  Nicole Schmidt is a 48 y/o woman with past medical history of breast cancer treated with lumpectomy (right sided), lymph node dissection and radiation therapy 2 years ago.  She has been on tamoxifen since then.  She also has HTN, DM2 and hypothyroidism.  She has had a slight cough and mild shortness of breath for about a week.  Immediately prior to going to the emergency room she developed severe shortness of breath and lightheadedness.  She felt like she might pass out every time she tried to move although she never syncopized.  She underwent CTA in the ER which showed large bilateral PEs with evidence of right heart strain.  She was admitted and transferred to [facility] for possible catheter directed lytic therapy.   SUBJECTIVE:   VITAL SIGNS: Temp:  [97.6 F (36.4 C)-98.4 F (36.9 C)] 98 F (36.7 C) (09/05 0751) Pulse Rate:  [86-132] 86 (09/05 0700) Resp:  [19-31] 24 (09/05 0700) BP: (63-137)/(24-97) 108/59 mmHg (09/05 0700) SpO2:  [83 %-100 %] 92 % (09/05 0700) FiO2 (%):  [0 %] 0 % (09/04 2119) Weight:  [151.3 kg (333 lb 8.9 oz)-152 kg (335 lb 1.6 oz)] 151.3 kg (333 lb 8.9 oz) (09/05 0115) HEMODYNAMICS:    Vent Mode:  [-]  FiO2 (%):  [0 %] 0 % INTAKE / OUTPUT:  Intake/Output Summary (Last 24 hours) at 10/19/14 0910 Last data filed at 10/19/14 0839  Gross per 24 hour  Intake 1237.27 ml  Output    160 ml  Net 1077.27 ml   PHYSICAL EXAMINATION: Physical Exam  Constitutional: She is oriented to person, place, and time. She appears well-developed and well-nourished.  HENT:  Head:  Normocephalic and atraumatic.  Eyes: Conjunctivae and EOM are normal. Pupils are equal, round, and reactive to light.  Neck: Normal range of motion. Neck supple.  Cardiovascular: Normal rate, regular rhythm, normal heart sounds and intact distal pulses.   Pulmonary/Chest: Effort normal and breath sounds normal. No respiratory distress.  Abdominal: Soft. Bowel sounds are normal.  Musculoskeletal: Normal range of motion. She exhibits edema.  Neurological: She is alert and oriented to person, place, and time. She has normal reflexes.  Skin: Skin is warm and dry.  Psychiatric: She has a normal mood and affect. Her behavior is normal.   LABS:  CBC  Recent Labs Lab 10/18/14 2146 10/19/14 0318 10/19/14 0636  WBC 11.7* 10.6* 11.6*  HGB 10.9* 10.5* 10.2*  HCT 35.5* 34.0* 32.8*  PLT 172 161 156   Coag's  Recent Labs Lab 10/18/14 2146  APTT 71*  INR 1.56*   BMET  Recent Labs Lab 10/18/14 2146 10/19/14 0318 10/19/14 0637  NA 137 137 136  K 5.4* 6.0* 5.5*  CL 102 104 103  CO2 26 24 24   BUN 27* 30* 32*  CREATININE 1.88* 2.31* 2.32*  GLUCOSE 128* 123* 103*   Electrolytes  Recent Labs Lab 10/18/14 2146 10/19/14 0318 10/19/14 0637  CALCIUM 9.0 8.2* 8.0*   Sepsis Markers  Recent Labs Lab 10/18/14 2146  LATICACIDVEN 2.0   ABG No results for input(s): PHART,  PCO2ART, PO2ART in the last 168 hours. Liver Enzymes  Recent Labs Lab 10/18/14 1503 10/18/14 2146  AST 66* 305*  ALT 54 240*  ALKPHOS 88 76  BILITOT 0.9 0.6  ALBUMIN 3.2* 2.7*   Cardiac Enzymes  Recent Labs Lab 10/18/14 1715 10/18/14 2146 10/19/14 0318  TROPONINI 0.68* 1.00* 1.00*   Glucose  Recent Labs Lab 10/18/14 2005 10/19/14 0009 10/19/14 0358 10/19/14 0750  GLUCAP 145* 102* 106* 84   Imaging Dg Chest 2 View  10/18/2014   CLINICAL DATA:  Short of breath for 1 week.  EXAM: CHEST  2 VIEW  COMPARISON:  None.  FINDINGS: NORMAL CARDIAC SILHOUETTE. THERE IS FINE AIRSPACE DISEASE IN LEFT  UPPER LOBE. No pleural fluid. No pneumothorax. Degenerative osteophytosis of the thoracic spine.  IMPRESSION: LEFT upper lobe pneumonia.   Electronically Signed   By: Suzy Bouchard M.D.   On: 10/18/2014 15:32   Ct Angio Chest Pe W/cm &/or Wo Cm  10/18/2014   CLINICAL DATA:  49 year old female with sudden onset shortness of breath. Possible left upper lung pneumonia on chest radiographs. Current history of breast cancer. Subsequent encounter.  EXAM: CT ANGIOGRAPHY CHEST WITH CONTRAST  TECHNIQUE: Multidetector CT imaging of the chest was performed using the standard protocol during bolus administration of intravenous contrast. Multiplanar CT image reconstructions and MIPs were obtained to evaluate the vascular anatomy.  CONTRAST:  24mL OMNIPAQUE IOHEXOL 350 MG/ML SOLN  COMPARISON:  Chest radiographs 1514 hr today.  FINDINGS: Good contrast bolus timing in the pulmonary arterial tree. Positive for bilateral acute pulmonary embolus. Distal main pulmonary artery thrombus bilaterally. Confluent segmental thrombus greater on the right.  Left ventricular hypertrophy.  RV / LV ratio greater than 1.  Cardiomegaly. No pericardial effusion. No pleural effusion. Patchy and confluent left greater than right peribronchovascular opacity about the hila. Mild superimposed scattered ground-glass opacity, resembling mosaic attenuation in the lower lobes. Major airways are patent.  No mediastinal lymphadenopathy. Mild calcified aortic atherosclerosis. There is some evidence of coronary artery calcified plaque. No axillary lymphadenopathy. Negative visualized thoracic inlet.  Visualized liver, spleen, and bowel in the upper abdomen are within normal limits.  Degenerative changes in the thoracic spine. No acute or suspicious osseous lesion identified.  Review of the MIP images confirms the above findings.  IMPRESSION: 1. Positive for acute PE with CT evidence of right heart strain (RV/LV Ratio > 1) consistent with at least submassive  (intermediate risk) PE. The presence of right heart strain has been associated with an increased risk of morbidity and mortality. Please activate Code PE by paging [phone]. 2. Critical Value/emergent results were called by telephone at the time of interpretation on 10/18/2014 at 4:53 pm to Dr. Conni Slipper , who verbally acknowledged these results. 3. Patchy perihilar opacity is nonspecific, suspicious for infarction. No pleural or pericardial effusion.   Electronically Signed   By: Genevie Ann M.D.   On: 10/18/2014 16:55   I reviewed CT myself, submassive PE noted with right heart strain.  Discussed with bedside RN.  ASSESSMENT / PLAN:  PULMONARY A: PE P:   - Currently on heparin.  - Undergoing EKOS - Titrate O2 for sat of 88-92%.  CARDIOVASCULAR A: Elevated troponin P:  - Right heart strain on CT - Tropnin likely secondary to PE, currently 1.0, will check two more. - Continue to trend until down-trending.   RENAL A:  CRF with poor urine output. Hyperkalemia. P:   - Baseline creatinine appears to be 1.4-1.5 - Pt is  on lisinopril and metformin both held. - IVF resuscitation, 100 ml/hr NS once EKOS is complete (with EKOS getting 100 ml/hr). - Kayexalate 60 gm x1.  HEMATOLOGIC A:  PE P:  - No previous history of clot  - Hypercoagulable work up pending.  - Heparin drip.  ENDOCRINE A:  Hypothyroidism, DM2   P:   - On metformin and synthroid - Hold metformin, continue SSI - Home synthroid dose - Advance diet to heart healthy.  FAMILY  - Updates: No family bedside.  Rush Farmer, M.D. [facility] Pulmonary/Critical Care Medicine. Pager: [phone]. After hours pager: [phone].  10/19/2014, 9:10 AM

## 2014-10-19 NOTE — Sedation Documentation (Signed)
114/62, 110, 24. Hooking up Ekos machine and meds then move to bed.  Tolerated well, Alert and oriented no c/o pain.

## 2014-10-19 NOTE — Procedures (Signed)
Completion mean pressure of the main PA = 36 (38 at time of procedure initiation). Above discussed with Dr. Nelda Marseille and decision made to terminate procedure. All wires, catheters and sheaths removed intact. Keep R leg straight for 1 hr.  Ronny Bacon, MD Pager #: [phone]

## 2014-10-19 NOTE — Sedation Documentation (Signed)
F/u lysis procedure no sedation given

## 2014-10-19 NOTE — Progress Notes (Signed)
Pt's SBP in 70's upon return from Ekos, informed Dr Lamonte Sakai. NS 500 given per order.Marland Kitchen

## 2014-10-19 NOTE — Progress Notes (Addendum)
Pt has decreased urine output, total 25cc for shift. Informed Dr Lamonte Sakai. Order for 500 bolus.

## 2014-10-19 NOTE — Progress Notes (Signed)
ANTICOAGULATION CONSULT NOTE - Follow Up Consult  Pharmacy Consult for heparin Indication: pulmonary embolus  No Known Allergies  Patient Measurements: Height: 5\' 4"  (162.6 cm) Weight: (!) 333 lb 8.9 oz (151.3 kg) IBW/kg (Calculated) : 54.7 Heparin Dosing Weight: 93.5 kg  Vital Signs: Temp: 97.6 F (36.4 C) (09/05 1930) Temp Source: Oral (09/05 1930) BP: 139/63 mmHg (09/05 2000) Pulse Rate: 92 (09/05 2000)  Labs:  Recent Labs  10/18/14 2146 10/19/14 0318 10/19/14 [id number] 10/19/14 0636 10/19/14 0637 10/19/14 1128 10/19/14 1129 10/19/14 1545 10/19/14 1645 10/19/14 2057  HGB 10.9* 10.5*  --  10.2*  --  9.9*  --   --  9.8*  --   HCT 35.5* 34.0*  --  32.8*  --  31.6*  --   --  31.6*  --   PLT 172 161  --  156  --  149*  --   --  148*  --   APTT 71*  --   --   --   --   --   --   --   --   --   LABPROT 18.7*  --   --   --   --   --   --   --   --   --   INR 1.56*  --   --   --   --   --   --   --   --   --   HEPARINUNFRC  --   --  0.30  --   --   --  0.34  --   --  0.30  CREATININE 1.88* 2.31*  --   --  2.32*  --   --  1.88*  --   --   TROPONINI 1.00* 1.00*  --   --   --  0.64*  --   --   --   --     Estimated Creatinine Clearance: 52.1 mL/min (by C-G formula based on Cr of 1.88).   Medications:  Infusions:  . sodium chloride Stopped (10/19/14 1300)  . sodium chloride Stopped (10/19/14 1300)  . sodium chloride Stopped (10/19/14 1300)  . sodium chloride Stopped (10/19/14 1300)  . sodium chloride 100 mL/hr at 10/19/14 1400  . heparin 1,800 Units/hr (10/19/14 2049)    Assessment: 49 year old female with a PE. Pt was transferred to [facility] on IV heparin and is s/p bilateral ultrasound assisted thrombolysis with EKOS. Heparin level is therapeutic on the low end of goal at 0.3 on 1800 units/hr. No bleeding noted, CBC is stable.  Goal of Therapy:  Heparin level 0.3-0.7 units/ml Monitor platelets by anticoagulation protocol: Yes   Plan:  - Increase heparin drip to  1900 units/hr - Daily heparin level and CBC - Monitor for s/sx of bleeding  [facility] [place].D., BCPS Clinical Pharmacist Pager: [phone] 10/19/2014 9:12 PM

## 2014-10-19 NOTE — Consult Note (Addendum)
ANTICOAGULATION CONSULT NOTE - Initial Consult  Pharmacy Consult for heparin Indication: pulmonary embolus  No Known Allergies  Patient Measurements: Height: 5\' 4"  (162.6 cm) Weight: (!) 333 lb 8.9 oz (151.3 kg) IBW/kg (Calculated) : 54.7 Heparin Dosing Weight: 93.5    Vital Signs: Temp: 98.2 F (36.8 C) (09/05 0400) Temp Source: Oral (09/05 0400) BP: 108/59 mmHg (09/05 0700) Pulse Rate: 86 (09/05 0700)  Labs:  Recent Labs  10/18/14 1715 10/18/14 2146 10/19/14 0318 10/19/14 0635 10/19/14 0636 10/19/14 0637  HGB  --  10.9* 10.5*  --  10.2*  --   HCT  --  35.5* 34.0*  --  32.8*  --   PLT  --  172 161  --  156  --   APTT  --  71*  --   --   --   --   LABPROT  --  18.7*  --   --   --   --   INR  --  1.56*  --   --   --   --   HEPARINUNFRC  --   --   --  0.30  --   --   CREATININE  --  1.88* 2.31*  --   --  2.32*  TROPONINI 0.68* 1.00* 1.00*  --   --   --     Estimated Creatinine Clearance: 42.3 mL/min (by C-G formula based on Cr of 2.32).   Medical History: Past Medical History  Diagnosis Date  . Arthritis   . Menopause     age 37  . Hypertension   . Gout   . Thyroid goiter   . 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 Er/PR pos, her 2 negative  . Diabetes mellitus without complication   . Renal insufficiency   . Anemia   . Gout     Medications:  Scheduled:  . alteplase (tPA/ ACTIVASE) PE Lysis 12 mg/250 mL (BILATERAL)  12 mg Intravenous Once  . alteplase (tPA/ ACTIVASE) PE Lysis 12 mg/250 mL (BILATERAL)  12 mg Intravenous Once  . antiseptic oral rinse  7 mL Mouth Rinse BID  . insulin aspart  0-15 Units Subcutaneous 6 times per day  . lidocaine      . midazolam      . sodium chloride  3 mL Intravenous Q12H  . tamoxifen  20 mg Oral Daily  . thyroid  120 mg Oral Daily    Assessment: Pt is a 49 year old female with a PE. Pt was transferred to [facility] on IV heparin and is  currently undergoing bilateral ultrasound assisted thrombolysis with EKOS. HL this AM therapeutic at 0.3 but on lower end of goal. H/H low but stable. Plt 156K. RN reports no s/s of bleeding   Goal of Therapy:  Heparin level 0.3-0.7 units/ml Monitor platelets by anticoagulation protocol: Yes   Plan:  Increase IV heparin to 1700 units/hr Check anti-Xa level in 6 hours and daily while on heparin Continue to monitor H&H and platelets  Albertina Parr, PharmD., BCPS Clinical Pharmacist Pager [phone]    Addendum: Patient's repeat HL remains therapeutic. She is now s/p sheath removal. Per RN, IV heparin was resumed at 1700 units/hr with no bolus. No s/s of bleeding. Increase heparin infusion to 1800 units/hr and f/u 6 hr HL   Albertina Parr, PharmD., BCPS Clinical Pharmacist Pager [phone]

## 2014-10-19 NOTE — Progress Notes (Signed)
PT Cancellation Note  Patient Details Name: AMOR CAPRETTA MRN: [id number] DOB: [birth year]   Cancelled Treatment:    Reason Eval/Treat Not Completed: Patient not medically ready. Discussed pt case with RN who states that pt still has a femoral sheath and pt will not be appropriate for PT until sheath is removed and bedrest is over. Will continue to follow and complete PT eval when able.    Rolinda Roan 10/19/2014, 10:55 AM  Rolinda Roan, PT, DPT Acute Rehabilitation Services Pager: [phone]

## 2014-10-19 NOTE — Sedation Documentation (Signed)
Right side-Main PAP 51/27 (36) Left side-Main PAP 53/26 (35)

## 2014-10-19 NOTE — Progress Notes (Signed)
Utilization Review Completed.Manahil Vanzile T9/06/2014  

## 2014-10-20 ENCOUNTER — Inpatient Hospital Stay (HOSPITAL_COMMUNITY): Payer: BLUE CROSS/BLUE SHIELD

## 2014-10-20 DIAGNOSIS — I82409 Acute embolism and thrombosis of unspecified deep veins of unspecified lower extremity: Secondary | ICD-10-CM

## 2014-10-20 DIAGNOSIS — I2699 Other pulmonary embolism without acute cor pulmonale: Secondary | ICD-10-CM | POA: Insufficient documentation

## 2014-10-20 LAB — BASIC METABOLIC PANEL
Anion gap: 7 (ref 5–15)
BUN: 22 mg/dL — ABNORMAL HIGH (ref 6–20)
CO2: 28 mmol/L (ref 22–32)
Calcium: 8.4 mg/dL — ABNORMAL LOW (ref 8.9–10.3)
Chloride: 108 mmol/L (ref 101–111)
Creatinine, Ser: 1.36 mg/dL — ABNORMAL HIGH (ref 0.44–1.00)
GFR calc Af Amer: 51 mL/min — ABNORMAL LOW (ref >60)
GFR calc non Af Amer: 44 mL/min — ABNORMAL LOW (ref >60)
Glucose, Bld: 106 mg/dL — ABNORMAL HIGH (ref 65–99)
Potassium: 4.5 mmol/L (ref 3.5–5.1)
Sodium: 143 mmol/L (ref 135–145)

## 2014-10-20 LAB — CBC
HCT: 33 % — ABNORMAL LOW (ref 36.0–46.0)
Hemoglobin: 9.8 g/dL — ABNORMAL LOW (ref 12.0–15.0)
MCH: 26.7 pg (ref 26.0–34.0)
MCHC: 29.7 g/dL — ABNORMAL LOW (ref 30.0–36.0)
MCV: 89.9 fL (ref 78.0–100.0)
Platelets: 158 10*3/uL (ref 150–400)
RBC: 3.67 MIL/uL — ABNORMAL LOW (ref 3.87–5.11)
RDW: 16.1 % — ABNORMAL HIGH (ref 11.5–15.5)
WBC: 9.4 10*3/uL (ref 4.0–10.5)

## 2014-10-20 LAB — GLUCOSE, CAPILLARY
Glucose-Capillary: 95 mg/dL (ref 65–99)
Glucose-Capillary: 89 mg/dL (ref 65–99)
Glucose-Capillary: 118 mg/dL — ABNORMAL HIGH (ref 65–99)
Glucose-Capillary: 109 mg/dL — ABNORMAL HIGH (ref 65–99)
Glucose-Capillary: 90 mg/dL (ref 65–99)

## 2014-10-20 LAB — PHOSPHORUS: Phosphorus: 4.5 mg/dL (ref 2.5–4.6)

## 2014-10-20 LAB — MAGNESIUM: Magnesium: 1.9 mg/dL (ref 1.7–2.4)

## 2014-10-20 LAB — HEPARIN LEVEL (UNFRACTIONATED): Heparin Unfractionated: 0.34 IU/mL (ref 0.30–0.70)

## 2014-10-20 MED ORDER — RIVAROXABAN 20 MG PO TABS: 20.0000 mg | ORAL_TABLET | Freq: Every day | ORAL | Status: DC

## 2014-10-20 MED ORDER — RIVAROXABAN 15 MG PO TABS
15.0000 mg | ORAL_TABLET | Freq: Two times a day (BID) | ORAL | Status: DC
  Administered 2014-10-20 – 2014-10-23 (×7): 15 mg via ORAL
  Filled 2014-10-20 (×8): qty 1

## 2014-10-20 NOTE — Progress Notes (Signed)
respiratory paged about patients stats will come and assess

## 2014-10-20 NOTE — Progress Notes (Signed)
NURSING PROGRESS NOTE  CHELLY DELAY [id number] Transfer Data: 10/20/2014 6:36 PM Attending Provider: Raylene Miyamoto, MD [id number] J, MD Code Status: Full  Allergies:  Review of patient's allergies indicates no known allergies. Past Medical History:   has a past medical history of Arthritis; Menopause; Hypertension; Gout; Thyroid goiter; Breast cancer of upper-inner quadrant of right female breast; Diabetes mellitus without complication; Renal insufficiency; Anemia; and Gout. Past Surgical History:   has past surgical history that includes Breast surgery (Right, 2014) and Breast biopsy (Right, 2014). Social History:   reports that she has never smoked. She has never used smokeless tobacco. She reports that she does not drink alcohol or use illicit drugs.  Nicole Schmidt is a 49 y.o. female patient transferred from 59M.  Blood pressure 146/69, pulse 84, temperature 98.3 F (36.8 C), temperature source Oral, resp. rate 25, height 5\' 4"  (1.626 m), weight 154.6 kg (340 lb 13.3 oz), last menstrual period 07/17/2012, SpO2 93 %.  Cardiac Monitoring: Box # 19 in place. Cardiac monitor yields:normal sinus rhythm.  IV Fluids:  IV in place, occlusive dsg intact without redness, IV cath forearm left, condition patent and no redness normal saline.   Skin: R groin cath prior incisional site, clean dry intact.  Will continue to evaluate and treat per MD orders.   Hendricks Limes RN, BS, BSN

## 2014-10-20 NOTE — Progress Notes (Signed)
[facility] on call notified about patient destats.

## 2014-10-20 NOTE — H&P (Signed)
PULMONARY / CRITICAL CARE MEDICINE   Name: Nicole Schmidt MRN: [id number] DOB: 1965/10/04    ADMISSION DATE:  10/18/2014   REFERRING MD :  Selena [facility] ED  CHIEF COMPLAINT:  PE  INITIAL PRESENTATION: Shortness of breath 49 yr old breast ca on tamoxifin  STUDIES:  CTA 10/18/14 slight infiltrate left, PE, ratio 1  SIGNIFICANT EVENTS: CTA showed bilateral submassive PE on 10/18/14.  Transferred to [facility] for catheter directed lytic therapy.  SUBJECTIVE: no distress, eating in bed  VITAL SIGNS: Temp:  [97.6 F (36.4 C)-99 F (37.2 C)] 98 F (36.7 C) (09/06 0737) Pulse Rate:  [81-105] 83 (09/06 0700) Resp:  [18-25] 19 (09/06 0700) BP: (117-161)/(60-83) 135/64 mmHg (09/06 0700) SpO2:  [81 %-100 %] 91 % (09/06 0700) Weight:  [154.6 kg (340 lb 13.3 oz)] 154.6 kg (340 lb 13.3 oz) (09/06 0453) HEMODYNAMICS:      INTAKE / OUTPUT:  Intake/Output Summary (Last 24 hours) at 10/20/14 0916 Last data filed at 10/20/14 0700  Gross per 24 hour  Intake 2560.88 ml  Output   3460 ml  Net -899.12 ml   PHYSICAL EXAMINATION:  Ignore this exam, cant delete it df 9/6 Physical Exam  Constitutional: She is oriented to person, place, and time. She appears well-developed and well-nourished.  HENT:  Head: Normocephalic and atraumatic.  Eyes: Conjunctivae and EOM are normal. Pupils are equal, round, and reactive to light.  Neck: Normal range of motion. Neck supple.  Cardiovascular: Normal rate, regular rhythm, normal heart sounds and intact distal pulses.   Pulmonary/Chest: Effort normal and breath sounds normal. No respiratory distress.  Abdominal: Soft. Bowel sounds are normal.  Musculoskeletal: Normal range of motion. She exhibits edema.  Neurological: She is alert and oriented to person, place, and time. She has normal reflexes.  Skin: Skin is warm and dry.  Psychiatric: She has a normal mood and affect. Her behavior is normal.   LABS:   Df exam  General: awake, no distress Neuro:awake,  nonfocal, appropriatre HEENT: obese, perr PULM: CTA, slight coarse left CV: s1 s2 RRR not tachy GI: soft, obese , nt n d Extremities: no edema     CBC  Recent Labs Lab 10/19/14 1645 10/19/14 2311 10/20/14 0452  WBC 9.1 9.1 9.4  HGB 9.8* 10.3* 9.8*  HCT 31.6* 33.2* 33.0*  PLT 148* 159 158   Coag's  Recent Labs Lab 10/18/14 2146  APTT 71*  INR 1.56*   BMET  Recent Labs Lab 10/19/14 0637 10/19/14 1545 10/20/14 0452  NA 136 138 143  K 5.5* 4.4 4.5  CL 103 105 108  CO2 24 26 28   BUN 32* 31* 22*  CREATININE 2.32* 1.88* 1.36*  GLUCOSE 103* 119* 106*   Electrolytes  Recent Labs Lab 10/19/14 0637 10/19/14 1545 10/20/14 0452  CALCIUM 8.0* 8.3* 8.4*  MG  --   --  1.9  PHOS  --   --  4.5   Sepsis Markers  Recent Labs Lab 10/18/14 2146  LATICACIDVEN 2.0   ABG No results for input(s): PHART, PCO2ART, PO2ART in the last 168 hours. Liver Enzymes  Recent Labs Lab 10/18/14 1503 10/18/14 2146  AST 66* 305*  ALT 54 240*  ALKPHOS 88 76  BILITOT 0.9 0.6  ALBUMIN 3.2* 2.7*   Cardiac Enzymes  Recent Labs Lab 10/18/14 2146 10/19/14 0318 10/19/14 1128  TROPONINI 1.00* 1.00* 0.64*   Glucose  Recent Labs Lab 10/19/14 1140 10/19/14 1536 10/19/14 1929 10/19/14 2313 10/20/14 0445 10/20/14 0739  GLUCAP 94  102* 136* 105* 95 89   Imaging Ir Jacolyn Reedy F/u Eval Art/ven Final Day (ms)  10/19/2014   INDICATION: History of sub massive pulmonary embolism, post initiation of bilateral ultrasound assisted pulmonary arterial thrombolysis.  Patient returns to the Interventional Radiology suite for completion pulmonary arterial pressure measurements.  EXAM: IR THROMB F/U EVAL ART/VEN FINAL DAY  COMPARISON:  Initiation of bilateral ultrasound assisted pulmonary arterial thrombolysis - 10/18/2014; chest CTA -10/18/2014  MEDICATIONS: None.  CONTRAST:  None  FLUOROSCOPY TIME:  6 seconds.  COMPLICATIONS: None immediate  TECHNIQUE: The patient was positioned supine on the  fluoroscopy table. A preprocedural spot fluoroscopic image was obtained of the chest.  The infusion wire of right pulmonary arterial infusion catheter was removed and the catheter was retracted to the level of the main pulmonary artery. Pulmonary arterial pressure measurements were obtained through the gland porch of the infusion catheter. This procedure was repeated for the contralateral left pulmonary arterial infusion catheter.  Pressure measurement results were discussed with referring physician, Dr. Nelda Marseille, and the decision was made to terminate the procedure.  As such, all wires catheters and sheaths were removed from the patient. Hemostasis was achieved at the right groin access site with manual compression. A dressing was placed. The patient tolerated the procedure well without immediate postprocedural complication.  FINDINGS: Fluoroscopic imaging demonstrates unchanged positioning of the bilateral pulmonary arterial ultrasound assisted infusion catheters with distal radiopaque tips overlying expected location of bilateral subsegmental pulmonary arteries.  Main pulmonary arterial pressure measurements at the time of pulmonary arterial initiation - 54/26 mm Hg (mean - 36).  Postprocedural main pulmonary arterial measurements obtained via the right pulmonary arterial infusion catheter: 54/28 mm Hg (mean - 37)  Postprocedural main pulmonary arterial measurements obtained via the left pulmonary arterial infusion catheter: 51/27 mm Hg (mean - 36)  IMPRESSION: No significant reduction in main pulmonary arterial pressure following 12 hours of ultrasound assisted pulmonary arterial thrombolysis. This was discussed with referring physician, Dr. Nelda Marseille, and the decision was made to terminate the procedure, as it is believed the patient has chronic pre-existing pulmonary arterial hypertension and sleep apnea and as such, continued thrombolysis is not indicated.  Above discussed with Dr. Nelda Marseille at the time of procedure  completion.   Electronically Signed   By: Sandi Mariscal M.D.   On: 10/19/2014 13:10    Discussed with bedside RN.  ASSESSMENT / PLAN:  PULMONARY A: PE, r/o OSA (desat when sleeping) P:   - Currently on heparin.  - add oral agent  - Titrate O2 for sat of 88-92%. -add empiric cpap -need outpt sleep study  CARDIOVASCULAR A: Elevated troponin from PE P:  - echo needed, assess pa pressures -hep add xarelto -see heme -await dopplers legs  RENAL A:  ARF, improving P:   - Baseline creatinine appears to be 1.4-1.5 - [name] for renal vein thrombosis -allow pos balance -chem in am   HEMATOLOGIC A:  PE in setting tamoxifin (reports out for PE), breast ca P:  - No previous history of clot  - Hypercoagulable work up pending.  - Heparin drip -risk is tamox, needs to follow up with heme for cancer reoccurrence screening, dc tamix  ENDOCRINE A:  Hypothyroidism, DM2   P:   - continue SSI - Home synthroid dose - Advance diet to heart healthy.  FAMILY  - Updates: No family bedside.  To tele  Lavon Paganini. Titus Mould, MD, [place] Pgr: [facility]

## 2014-10-20 NOTE — Progress Notes (Signed)
*  Preliminary Results* Bilateral lower extremity venous duplex completed. Bilateral lower extremities are negative for deep vein thrombosis. There is no evidence of Baker's cyst bilaterally.  10/20/2014  Maudry Mayhew, RVT, RDCS, RDMS

## 2014-10-20 NOTE — Progress Notes (Signed)
Pt not ambulated this morning since lower extremity doppler has not yet been completed.

## 2014-10-20 NOTE — Progress Notes (Signed)
ANTICOAGULATION CONSULT NOTE - Follow Up Consult  Pharmacy Consult for heparin>>Xarelto Indication: pulmonary embolus  No Known Allergies  Patient Measurements: Height: 5\' 4"  (162.6 cm) Weight: (!) 340 lb 13.3 oz (154.6 kg) IBW/kg (Calculated) : 54.7 Heparin Dosing Weight: 93.5 kg  Vital Signs: Temp: 98 F (36.7 C) (09/06 0737) Temp Source: Oral (09/06 0737) BP: 135/64 mmHg (09/06 0700) Pulse Rate: 83 (09/06 0700)  Labs:  Recent Labs  10/18/14 2146 10/19/14 0318  10/19/14 0637 10/19/14 1128 10/19/14 1129 10/19/14 1545 10/19/14 1645 10/19/14 2057 10/19/14 2311 10/20/14 0451 10/20/14 0452  HGB 10.9* 10.5*  < >  --  9.9*  --   --  9.8*  --  10.3*  --  9.8*  HCT 35.5* 34.0*  < >  --  31.6*  --   --  31.6*  --  33.2*  --  33.0*  PLT 172 161  < >  --  149*  --   --  148*  --  159  --  158  APTT 71*  --   --   --   --   --   --   --   --   --   --   --   LABPROT 18.7*  --   --   --   --   --   --   --   --   --   --   --   INR 1.56*  --   --   --   --   --   --   --   --   --   --   --   HEPARINUNFRC  --   --   < >  --   --  0.34  --   --  0.30  --  0.34  --   CREATININE 1.88* 2.31*  --  2.32*  --   --  1.88*  --   --   --   --  1.36*  TROPONINI 1.00* 1.00*  --   --  0.64*  --   --   --   --   --   --   --   < > = values in this interval not displayed.  Estimated Creatinine Clearance: 73.2 mL/min (by C-G formula based on Cr of 1.36).   Medications:  Infusions:  . sodium chloride 100 mL/hr at 10/20/14 0100  . heparin 1,900 Units/hr (10/19/14 2139)    Assessment: 49 year old female with a PE. Pt was transferred to [facility] on IV heparin and is s/p bilateral ultrasound assisted thrombolysis with EKOS. Heparin level is therapeutic on the low end of goal at 0.34 on 1900 units/hr. No bleeding noted, CBC is stable. Now switching to oral rivaroxaban. Renal fx has improved drastically and CrCl > 50 mL/min   Goal of Therapy:   Monitor platelets by anticoagulation  protocol: Yes   Plan:  - Stop IV heparin  - Give rivaroxaban 15 mg twice daily x 21 day, then decrease to rivaroxaban 20 mg daily  - Monitor for s/sx of bleeding - Provide education on rivaroxaban   Albertina Parr, PharmD., BCPS Clinical Pharmacist Pager [phone]

## 2014-10-20 NOTE — Progress Notes (Signed)
PT Cancellation Note  Patient Details Name: Nicole Schmidt MRN: [id number] DOB: [birth year]   Cancelled Treatment:    Reason Eval/Treat Not Completed: Patient not medically ready. Pt has a LE doppler pending to rule out DVT. Per MD, will hold PT eval at this time.    Rolinda Roan 10/20/2014, 1:55 PM   Rolinda Roan, PT, DPT Acute Rehabilitation Services Pager: [phone]

## 2014-10-20 NOTE — Clinical Documentation Improvement (Signed)
Critical Care  Can the diagnosis of CKD be further specified?   CKD Stage I - GFR greater than or equal to 90  CKD Stage II - GFR 60-89  CKD Stage III - GFR 30-59  CKD Stage IV - GFR 15-29  CKD Stage V - GFR < 15  ESRD (End Stage Renal Disease)  Other condition  Unable to clinically determine   Supporting Information: : (risk factors, signs and symptoms, diagnostics, treatment) Patient with chronic renal failure per 9/04 progress notes.        (CRF codes to chronic kidney disease).  Labs: Bun     Creat     GFR: 9/06:  22         1.36       44 9/05:  31         1.88       30 9/05:  32         2.32       23  Please exercise your independent, professional judgment when responding. A specific answer is not anticipated or expected.   Thank You, [place]

## 2014-10-20 NOTE — Care Management Note (Addendum)
Case Management Note  Patient Details  Name: Nicole Schmidt MRN: [id number] Date of Birth: 1965-03-02  Subjective/Objective:  PE - started on Xaralto - checking cost - still waiting on benefits check, I  have given her co pay cards.  Confirmed insurance and she uses a mail order company for meds.  Informed she needs to get her first 30 days at a pharmacy then work with her mail order company on the rest with use of co pay card. Strongly encouraged to use [facility] pharmacy.                   Action/Plan:   Expected Discharge Date:                  Expected Discharge Plan:  Home/Self Care  In-House Referral:     Discharge planning Services  CM Consult, Medication Assistance  Post Acute Care Choice:    Choice offered to:     DME Arranged:    DME Agency:     HH Arranged:    HH Agency:     Status of Service:  In process, will continue to follow  Medicare Important Message Given:    Date Medicare IM Given:    Medicare IM give by:    Date Additional Medicare IM Given:    Additional Medicare Important Message give by:     If discussed at [place] of Stay Meetings, dates discussed:    Additional Comments:  Vergie Living, RN 10/20/2014, 10:06 AM

## 2014-10-21 ENCOUNTER — Encounter (HOSPITAL_COMMUNITY): Payer: Self-pay

## 2014-10-21 LAB — BLOOD GAS, ARTERIAL
Acid-Base Excess: 3.3 mmol/L — ABNORMAL HIGH (ref 0.0–2.0)
Bicarbonate: 28 mEq/L — ABNORMAL HIGH (ref 20.0–24.0)
Drawn by: 129711
O2 Content: 6 L/min
O2 Saturation: 89.9 %
Patient temperature: 98.6
TCO2: 29.5 mmol/L (ref 0–100)
pCO2 arterial: 47.8 mmHg — ABNORMAL HIGH (ref 35.0–45.0)
pH, Arterial: 7.385 (ref 7.350–7.450)
pO2, Arterial: 60.4 mmHg — ABNORMAL LOW (ref 80.0–100.0)

## 2014-10-21 LAB — GLUCOSE, CAPILLARY
Glucose-Capillary: 120 mg/dL — ABNORMAL HIGH (ref 65–99)
Glucose-Capillary: 111 mg/dL — ABNORMAL HIGH (ref 65–99)
Glucose-Capillary: 102 mg/dL — ABNORMAL HIGH (ref 65–99)
Glucose-Capillary: 93 mg/dL (ref 65–99)
Glucose-Capillary: 125 mg/dL — ABNORMAL HIGH (ref 65–99)
Glucose-Capillary: 160 mg/dL — ABNORMAL HIGH (ref 65–99)

## 2014-10-21 MED ORDER — RIVAROXABAN (XARELTO) EDUCATION KIT FOR DVT/PE PATIENTS
PACK | Freq: Once | Status: AC
  Administered 2014-10-21: 14:00:00
  Filled 2014-10-21: qty 1

## 2014-10-21 MED ORDER — LORAZEPAM 2 MG/ML IJ SOLN
1.0000 mg | Freq: Once | INTRAMUSCULAR | Status: AC
  Administered 2014-10-21: 1 mg via INTRAVENOUS
  Filled 2014-10-21: qty 1

## 2014-10-21 NOTE — Progress Notes (Signed)
Name:Nicole Schmidt [id number] DOB:1965/03/03   ADMISSION DATE: 10/18/2014   REFERRING MD : [facility]  CHIEF COMPLAINT: PE  INITIAL PRESENTATION: Shortness of breath 49 yr old breast ca on tamoxifin  STUDIES:  CTA 10/18/14 slight infiltrate left, PE, ratio 1  SIGNIFICANT EVENTS: CTA showed bilateral submassive PE on 10/18/14. Transferred to [facility] for catheter directed lytic therapy.  SUBJECTIVE: no distress, lying in bed in bed VS:  BP 115/79 mmHg  Pulse 82  Temp(Src) 97.4 F (36.3 C) (Oral)  Resp 18  Ht 5\' 4"  (1.626 m)  Wt 337 lb 8.4 oz (153.1 kg)  BMI 57.91 kg/m2  SpO2 95%  LMP 07/17/2012   Intake/Output Summary (Last 24 hours) at 10/21/14 1102 Last data filed at 10/21/14 0000  Gross per 24 hour  Intake      0 ml  Output   1375 ml  Net  -1375 ml   PHYSICAL EXAMINATION:  Physical Exam  Constitutional: She is oriented to person, place, and time. She appears well-developed and well-nourished.  HENT:  Head: Normocephalic and atraumatic.  Eyes: Conjunctivae and EOM are normal. Pupils are equal, round, and reactive to light.  Neck: Normal range of motion. Neck supple.  Cardiovascular: Normal rate, regular rhythm, normal heart sounds and intact distal pulses.  Pulmonary/Chest: Effort normal and breath sounds normal. No respiratory distress.  Abdominal: Soft. Bowel sounds are normal.  Musculoskeletal: Normal range of motion. She exhibits edema.  Neurological: She is alert and oriented to person, place, and time. She has normal reflexes.  Skin: Skin is warm and dry.  Psychiatric: She has a normal mood and affect. Her behavior is normal   Recent Labs Lab 10/19/14 0637 10/19/14 1545 10/20/14 0452  NA 136 138 143  K 5.5* 4.4 4.5  CL 103 105 108  CO2 24 26 28   BUN 32* 31* 22*  CREATININE 2.32* 1.88* 1.36*  GLUCOSE 103* 119* 106*    Recent Labs Lab 10/19/14 1645 10/19/14 2311 10/20/14 0452  HGB 9.8* 10.3* 9.8*  HCT 31.6* 33.2*  33.0*  WBC 9.1 9.1 9.4  PLT 148* 159 158    US Renal  10/20/2014   CLINICAL DATA:  Known pulmonary embolism and hypertension  EXAM: RENAL / URINARY TRACT ULTRASOUND COMPLETE  COMPARISON:  None.  FINDINGS: Right Kidney:  Length: 10.0 cm. Echogenicity within normal limits. No mass or hydronephrosis visualized. Normal vascular flow is noted.  Left Kidney:  Length: 13.3 cm. Echogenicity within normal limits. No mass or hydronephrosis visualized. Normal vascular flow is noted.  Bladder:  Appears normal for degree of bladder distention.  IMPRESSION: Normal-appearing kidneys. Normal vascularity is noted on this limited exam. This does not constitute a full renal Doppler examination.   Electronically Signed   By: Inez Catalina M.D.   On: 10/20/2014 21:09   Ir Jacolyn Reedy F/u Eval Art/ven Final Day (ms)  10/19/2014   INDICATION: History of sub massive pulmonary embolism, post initiation of bilateral ultrasound assisted pulmonary arterial thrombolysis.  Patient returns to the Interventional Radiology suite for completion pulmonary arterial pressure measurements.  EXAM: IR THROMB F/U EVAL ART/VEN FINAL DAY  COMPARISON:  Initiation of bilateral ultrasound assisted pulmonary arterial thrombolysis - 10/18/2014; chest CTA -10/18/2014  MEDICATIONS: None.  CONTRAST:  None  FLUOROSCOPY TIME:  6 seconds.  COMPLICATIONS: None immediate  TECHNIQUE: The patient was positioned supine on the fluoroscopy table. A preprocedural spot fluoroscopic image was obtained of the chest.  The infusion wire of right pulmonary arterial infusion catheter was removed  and the catheter was retracted to the level of the main pulmonary artery. Pulmonary arterial pressure measurements were obtained through the gland porch of the infusion catheter. This procedure was repeated for the contralateral left pulmonary arterial infusion catheter.  Pressure measurement results were discussed with referring physician, Dr. Nelda Marseille, and the decision was made to terminate  the procedure.  As such, all wires catheters and sheaths were removed from the patient. Hemostasis was achieved at the right groin access site with manual compression. A dressing was placed. The patient tolerated the procedure well without immediate postprocedural complication.  FINDINGS: Fluoroscopic imaging demonstrates unchanged positioning of the bilateral pulmonary arterial ultrasound assisted infusion catheters with distal radiopaque tips overlying expected location of bilateral subsegmental pulmonary arteries.  Main pulmonary arterial pressure measurements at the time of pulmonary arterial initiation - 54/26 mm Hg (mean - 36).  Postprocedural main pulmonary arterial measurements obtained via the right pulmonary arterial infusion catheter: 54/28 mm Hg (mean - 37)  Postprocedural main pulmonary arterial measurements obtained via the left pulmonary arterial infusion catheter: 51/27 mm Hg (mean - 36)  IMPRESSION: No significant reduction in main pulmonary arterial pressure following 12 hours of ultrasound assisted pulmonary arterial thrombolysis. This was discussed with referring physician, Dr. Nelda Marseille, and the decision was made to terminate the procedure, as it is believed the patient has chronic pre-existing pulmonary arterial hypertension and sleep apnea and as such, continued thrombolysis is not indicated.  Above discussed with Dr. Nelda Marseille at the time of procedure completion.   Electronically Signed   By: Sandi Mariscal M.D.   On: 10/19/2014 13:10     ASSESSMENT / PLAN:  PULMONARY A: PE, r/o OSA (desat when sleeping) P:  - Currently on heparin.  - add oral agent  - Titrate O2 for sat of 88-92%. -add empiric cpap -need outpt sleep study  CARDIOVASCULAR A: Elevated troponin from PE, extensive and will need anticoagulation along with follow up with hemonc for breat cancer treatment and further heme work up P:  - echo needed, assess pa pressures, ordered 9/7 -hep converted to xarelto -see  heme -dopplers legs neg  RENAL Lab Results  Component Value Date   CREATININE 1.36* 10/20/2014   CREATININE 1.88* 10/19/2014   CREATININE 2.32* 10/19/2014   CREATININE 1.50* 03/09/2014   CREATININE 1.17 07/31/2013   CREATININE 1.42* 01/07/2013    A: ARF, improving P:  - Baseline creatinine appears to be 1.4-1.5 - [name] for renal vein thrombosis -allow pos balance -chem   HEMATOLOGIC A: PE in setting tamoxifin (reports out for PE), breast ca P:  - No previous history of clot  - Hypercoagulable work up pending.  - Xarelto -risk is tamox, needs to follow up with heme for cancer reoccurrence screening, dc tamix  ENDOCRINE A: Hypothyroidism, DM2  P:  - continue SSI - Home synthroid dose - Advance diet to heart healthy.  FAMILY  - Updates: No family bedside.   Discussion:  MO (68 ibs) suspected OSA/OHS, with extensive life threating PE requiring EKOS.  She remainns O2 dependent and is at high risk for for OSA. Continue Xarelto, PT to work with her. Await Echo. May be O2 dependent. We will ask [facility] to assume primary and PCCM will be available AS A CONSULT. . [facility] aware and report given. She has had a sleep eval set up with Dr. Ashby Dawes in [facility] for Oct 18th at 10:00 am.  Richardson Landry Elaura Calix ACNP Maryanna Shape PCCM Pager [phone] till 3 pm If no answer page [phone]  10/21/2014, 11:14 AM

## 2014-10-21 NOTE — Progress Notes (Signed)
Patient restless contacted Dr Jimmy Footman for medication

## 2014-10-21 NOTE — Progress Notes (Signed)
Patient pulled off cpap mask and desat.  Non rebreather applied sat sat 92%

## 2014-10-21 NOTE — Progress Notes (Signed)
[place] Progress Note Patient Name: Nicole Schmidt DOB: [birth year] MRN: [id number]   Date of Service  10/21/2014  HPI/Events of Note  Patient anxious and restless.  On NRB but sats are 96%.  HD stable.  Morphine refused as patient does not like the way it makes her feel.  eICU Interventions  Plan: One time dose of 1 mg ativan IV     Intervention Category Intermediate Interventions: Other:  DETERDING,ELIZABETH 10/21/2014, 1:19 AM

## 2014-10-21 NOTE — Progress Notes (Signed)
RT tried patient on CPAP for the night with 4L bled in. Patient O2 sats in mid 80's. RT changed patient to BiPAP 14/7 through black box and O2 was increased up to 12L. Patient O2 still between 86 and 88%. Nicole Schmidt was paged and RT was told to leave patient on current settings and continue to monitor.

## 2014-10-21 NOTE — Progress Notes (Addendum)
Per prior RN Gardiner Rhyme, the patient's SPO2 was 60% per continuous pulse oximetry, night shift respiratory therapy was paged and put the patient on a non-rebreather mask at 15L. This was viewed by myself and night shift RN Gardiner Rhyme during bedside report.

## 2014-10-21 NOTE — Discharge Instructions (Signed)
Information on my medicine - XARELTO (rivaroxaban)  This medication education was reviewed with me or my healthcare representative as part of my discharge preparation.  The pharmacist that spoke with me during my hospital stay was:  Eudelia Bunch, [place]? Xarelto was prescribed to treat blood clots that may have been found in the veins of your legs (deep vein thrombosis) or in your lungs (pulmonary embolism) and to reduce the risk of them occurring again.  What do you need to know about Xarelto? The starting dose is one 15 mg tablet taken TWICE daily with food for the FIRST 21 DAYS then on September 27th or after all the 15 mg tablets are gone  the dose is changed to one 20 mg tablet taken ONCE A DAY with your evening meal.  DO NOT stop taking Xarelto without talking to the health care provider who prescribed the medication.  Refill your prescription for 20 mg tablets before you run out.  After discharge, you should have regular check-up appointments with your healthcare provider that is prescribing your Xarelto.  In the future your dose may need to be changed if your kidney function changes by a significant amount.  What do you do if you miss a dose? If you are taking Xarelto TWICE DAILY and you miss a dose, take it as soon as you remember. You may take two 15 mg tablets (total 30 mg) at the same time then resume your regularly scheduled 15 mg twice daily the next day.  If you are taking Xarelto ONCE DAILY and you miss a dose, take it as soon as you remember on the same day then continue your regularly scheduled once daily regimen the next day. Do not take two doses of Xarelto at the same time.   Important Safety Information Xarelto is a blood thinner medicine that can cause bleeding. You should call your healthcare provider right away if you experience any of the following: ? Bleeding from an injury or your nose that does not stop. ? Unusual  colored urine (red or dark brown) or unusual colored stools (red or black). ? Unusual bruising for unknown reasons. ? A serious fall or if you hit your head (even if there is no bleeding).  Some medicines may interact with Xarelto and might increase your risk of bleeding while on Xarelto. To help avoid this, consult your healthcare provider or pharmacist prior to using any new prescription or non-prescription medications, including herbals, vitamins, non-steroidal anti-inflammatory drugs (NSAIDs) and supplements.  This website has more information on Xarelto: [email or web address]

## 2014-10-21 NOTE — Evaluation (Signed)
Physical Therapy Evaluation Patient Details Name: Nicole Schmidt MRN: [id number] DOB: 19-Oct-1965 Today's Date: 10/21/2014   History of Present Illness  Nicole Schmidt is a 49 y/o woman with past medical history of breast cancer treated with lumpectomy (right sided), lymph node dissection and radiation therapy 2 years ago. She has been on tamoxifen since then. She also has HTN, DM2 and hypothyroidism. She has had a slight cough and mild shortness of breath for about a week. Immediately prior to going to the emergency room she developed severe shortness of breath and lightheadedness. She felt like she might pass out every time she tried to move although she never syncopized. She underwent CTA in the ER which showed large bilateral PEs with evidence of right heart strain. She was admitted and transferred to [facility] for possible catheter directed lytic therapy which was not successful.  Clinical Impression  Pt admitted with above diagnosis. Pt currently with functional limitations due to the deficits listed below (see PT Problem List). Pt mobilized with min A today, generalized weakness noted as well as desat to 88% on 10L O2.  Pt will benefit from skilled PT to increase their independence and safety with mobility to allow discharge to the venue listed below.       Follow Up Recommendations Home health PT;Supervision for mobility/OOB    Equipment Recommendations  None recommended by PT    Recommendations for Other Services OT consult     Precautions / Restrictions Precautions Precautions: None Restrictions Weight Bearing Restrictions: No      Mobility  Bed Mobility Overal bed mobility: Needs Assistance Bed Mobility: Supine to Sit     Supine to sit: Min assist     General bed mobility comments: min A for SL to sit as well as use of rail, pt has been up only one other time before now  Transfers Overall transfer level: Needs assistance Equipment used: Rolling walker (2  wheeled) Transfers: Sit to/from [facility] Sit to Stand: Min assist Stand pivot transfers: Min assist       General transfer comment: min A for safety, pt with no LOB, wide BOS  Ambulation/Gait Ambulation/Gait assistance: Min guard Ambulation Distance (Feet): 25 Feet Assistive device: Rolling walker (2 wheeled) Gait Pattern/deviations: Step-through pattern;Wide base of support Gait velocity: decreased Gait velocity interpretation: Below normal speed for age/gender General Gait Details: slow gait with wide base. Pt on 10L O2, desat to 88%, returned to low 90's within 2 mins  Stairs            Wheelchair Mobility    Modified Rankin (Stroke Patients Only)       Balance Overall balance assessment: Needs assistance Sitting-balance support: No upper extremity supported Sitting balance-Leahy Scale: Good     Standing balance support: No upper extremity supported Standing balance-Leahy Scale: Fair Standing balance comment: can maintain balance without UE support, slightly unsteady, seems more likely to be due to being in bed than underlying balance deficit                             Pertinent Vitals/Pain Pain Assessment: No/denies pain    Home Living Family/patient expects to be discharged to:: Private residence Living Arrangements: Alone Available Help at Discharge: Family;Available 24 hours/day Type of Home: House Home Access: Ramped entrance     Home Layout: One level Home Equipment: Walker - 2 wheels;Cane - single point Additional Comments: home living above describes pt's mother's home.  Pt thinking about discharging there as it has been empty for a year (mother passed away) and it is handicap accessible and close to family. 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 Had episode of vertigo last year and now uses cane in open spaces for fear that she will get dizzy again     Hand Dominance   Dominant Hand: Right    Extremity/Trunk Assessment   Upper Extremity Assessment: Overall WFL for tasks assessed           Lower Extremity Assessment: Generalized weakness      Cervical / Trunk Assessment: Normal  Communication   Communication: No difficulties  Cognition Arousal/Alertness: Awake/alert Behavior During Therapy: WFL for tasks assessed/performed Overall Cognitive Status: Within Functional Limits for tasks assessed                      General Comments      Exercises        Assessment/Plan    PT Assessment Patient needs continued PT services  PT Diagnosis Generalized weakness;Difficulty walking   PT Problem List Decreased strength;Decreased activity tolerance;Decreased balance;Decreased mobility;Decreased knowledge of use of DME;Obesity;Cardiopulmonary status limiting activity  PT Treatment Interventions DME instruction;Gait training;Functional mobility training;Therapeutic activities;Therapeutic exercise;Balance training;Patient/family education   PT Goals (Current goals can be found in the Care Plan section) Acute Rehab PT Goals Patient Stated Goal: return to home and work PT Goal Formulation: With patient Time For Goal Achievement: 11/04/14 Potential to Achieve Goals: Good    Frequency Min 3X/week   Barriers to discharge Decreased caregiver support pt thinks she can get family help but needs to comfirm this    Co-evaluation               End of Session Equipment Utilized During Treatment: Oxygen Activity Tolerance: Patient tolerated treatment well Patient left: in chair;with call bell/phone within reach Nurse Communication: Mobility status         Time: [id number] PT Time Calculation (min) (ACUTE ONLY): 37 min   Charges:   PT Evaluation $Initial PT  Evaluation Tier I: 1 Procedure PT Treatments $Gait Training: 8-22 mins   PT G Codes:      Nicole Schmidt, PT  Acute Rehab Services  [place], [place] 10/21/2014, 1:27 PM

## 2014-10-22 ENCOUNTER — Inpatient Hospital Stay (HOSPITAL_COMMUNITY): Payer: BLUE CROSS/BLUE SHIELD

## 2014-10-22 DIAGNOSIS — N179 Acute kidney failure, unspecified: Secondary | ICD-10-CM

## 2014-10-22 DIAGNOSIS — N183 Chronic kidney disease, stage 3 unspecified: Secondary | ICD-10-CM

## 2014-10-22 DIAGNOSIS — R0600 Dyspnea, unspecified: Secondary | ICD-10-CM

## 2014-10-22 DIAGNOSIS — R7989 Other specified abnormal findings of blood chemistry: Secondary | ICD-10-CM

## 2014-10-22 DIAGNOSIS — I2699 Other pulmonary embolism without acute cor pulmonale: Principal | ICD-10-CM

## 2014-10-22 DIAGNOSIS — Z853 Personal history of malignant neoplasm of breast: Secondary | ICD-10-CM

## 2014-10-22 DIAGNOSIS — R778 Other specified abnormalities of plasma proteins: Secondary | ICD-10-CM

## 2014-10-22 LAB — BASIC METABOLIC PANEL
Anion gap: 5 (ref 5–15)
BUN: 15 mg/dL (ref 6–20)
CO2: 30 mmol/L (ref 22–32)
Calcium: 8.8 mg/dL — ABNORMAL LOW (ref 8.9–10.3)
Chloride: 106 mmol/L (ref 101–111)
Creatinine, Ser: 0.85 mg/dL (ref 0.44–1.00)
GFR calc Af Amer: >60 mL/min (ref >60)
GFR calc non Af Amer: >60 mL/min (ref >60)
Glucose, Bld: 101 mg/dL — ABNORMAL HIGH (ref 65–99)
Potassium: 4.3 mmol/L (ref 3.5–5.1)
Sodium: 141 mmol/L (ref 135–145)

## 2014-10-22 LAB — CBC
HCT: 33 % — ABNORMAL LOW (ref 36.0–46.0)
Hemoglobin: 10.1 g/dL — ABNORMAL LOW (ref 12.0–15.0)
MCH: 27.2 pg (ref 26.0–34.0)
MCHC: 30.6 g/dL (ref 30.0–36.0)
MCV: 88.7 fL (ref 78.0–100.0)
Platelets: 163 10*3/uL (ref 150–400)
RBC: 3.72 MIL/uL — ABNORMAL LOW (ref 3.87–5.11)
RDW: 15.6 % — ABNORMAL HIGH (ref 11.5–15.5)
WBC: 8.9 10*3/uL (ref 4.0–10.5)

## 2014-10-22 LAB — GLUCOSE, CAPILLARY
Glucose-Capillary: 126 mg/dL — ABNORMAL HIGH (ref 65–99)
Glucose-Capillary: 90 mg/dL (ref 65–99)
Glucose-Capillary: 118 mg/dL — ABNORMAL HIGH (ref 65–99)
Glucose-Capillary: 128 mg/dL — ABNORMAL HIGH (ref 65–99)
Glucose-Capillary: 163 mg/dL — ABNORMAL HIGH (ref 65–99)

## 2014-10-22 LAB — PHOSPHORUS: Phosphorus: 4.3 mg/dL (ref 2.5–4.6)

## 2014-10-22 LAB — MAGNESIUM: Magnesium: 1.6 mg/dL — ABNORMAL LOW (ref 1.7–2.4)

## 2014-10-22 MED ORDER — ONDANSETRON HCL 4 MG PO TABS: 4.0000 mg | ORAL_TABLET | Freq: Three times a day (TID) | ORAL | Status: DC | PRN

## 2014-10-22 NOTE — Progress Notes (Signed)
  Echocardiogram 2D Echocardiogram has been performed.  Tammi Boulier 10/22/2014, 1:04 PM

## 2014-10-22 NOTE — Progress Notes (Signed)
Patient was placed on CPAP of 10. O2 bleed in of 6L. Patient tolerating well. Sat 96%. RT will continue to monitor as needed.

## 2014-10-22 NOTE — Plan of Care (Signed)
Problem: Phase I Progression Outcomes Goal: OOB as tolerated unless otherwise ordered Outcome: Completed/Met Date Met:  10/22/14 OOB to chair today

## 2014-10-22 NOTE — Progress Notes (Signed)
[facility]                                                                              Patient Demographics  Nicole Schmidt, is a 49 y.o. female, DOB - Apr 14, 1965, [id number]  Admit date - 10/18/2014   Admitting Physician Nicole Farmer, MD  Outpatient Primary MD for the patient is Nicole Simmer, MD  LOS - 4   No chief complaint on file.     HPI on 10/18/2014 by Dr. Reginia Schmidt Nicole Schmidt is a 49 y/o woman with past medical history of breast cancer treated with lumpectomy (right sided), lymph node dissection and radiation therapy 2 years ago. She has been on tamoxifen since then. She also has HTN, DM2 and hypothyroidism. She has had a slight cough and mild shortness of breath for about a week. Immediately prior to going to the emergency room she developed severe shortness of breath and lightheadedness. She felt like she might pass out every time she tried to move although she never syncopized. She underwent CTA in the ER which showed large bilateral PEs with evidence of right heart strain. She was admitted and transferred to [facility] for possible catheter directed lytic therapy.   Assessment & Plan   Pulmonary Embolism  -Found on CTA and given EKOS lysis  -Placed on heparin and transitioned to Xarelto -Continue supplemental O2 -Lower extremity Doppler negative for DVT  ?OSA -Patient will need outpatient sleep study- Dr. Ashby Schmidt in [place] for Oct 18th at 10:00 am -Placed on empiric CPAP  Elevated troponin  -Echocardiogram pending -Likely secondary to PE  Acute on chronic kidney disease, stage III -Baseline creatinine approximately 1.4 1.5, currently 0.85 -Renal ultrasound: Normal-appearing kidneys, normal vascularity. No mass or hydronephrosis visualized -Continue to monitor BMP  History of breast cancer -Patient was on tamoxifen, this has been discontinued -Patient will need to follow-up with her oncologist -Hypercoagulable workup is pending however  may not be beneficial due to recent PE -Continue Xarelto  Hypothyroidism -Continue Synthroid  Diabetes mellitus, type II -Continue insulin size [place] CBG monitoring  Code Status: Full  Family Communication: None at bedside  Disposition Plan: Admitted.  Continue to monitor.   Time Spent in minutes   30 minutes  Procedures/Events 10/18/2014: CTA showed B/L submassive PE EKOS lysis LE doppler Echocardiogram Renal ultrasound  Consults   Interventional radiology PCCM  DVT Prophylaxis  Xarelto  Lab Results  Component Value Date   PLT 163 10/22/2014    Medications  Scheduled Meds: . insulin aspart  0-15 Units Subcutaneous 6 times per day  . Rivaroxaban  15 mg Oral BID WC  . [START ON 11/10/2014] rivaroxaban  20 mg Oral Q supper  . thyroid  120 mg Oral Daily   Continuous Infusions: . sodium chloride 10 mL/hr at 10/20/14 0931   PRN Meds:.sodium chloride, Influenza vac split quadrivalent PF, iohexol, morphine injection, ondansetron (ZOFRAN) IV  Antibiotics    Anti-infectives    None      Subjective:   Nicole Schmidt seen and examined today. Patient denies chest pain, abdominal pain, nausea or vomiting. States that she has bowel movements every 2-3 days. Feels that her breathing has not changed. Does  not want to go home on oxygen.   Objective:   Filed Vitals:   10/21/14 2204 10/22/14 0040 10/22/14 0500 10/22/14 0541  BP: 135/73   136/58  Pulse: 87 84  82  Temp: 98.3 F (36.8 C)   98.4 F (36.9 C)  TempSrc: Oral   Oral  Resp: 20 18    Height:      Weight:   150.2 kg (331 lb 2.1 oz)   SpO2: 93% 94%  97%    Wt Readings from Last 3 Encounters:  10/22/14 150.2 kg (331 lb 2.1 oz)  10/18/14 152 kg (335 lb 1.6 oz)  10/13/14 152.862 kg (337 lb)     Intake/Output Summary (Last 24 hours) at 10/22/14 1236 Last data filed at 10/22/14 1100  Gross per 24 hour  Intake    360 ml  Output   2250 ml  Net  -1890 ml    Exam  General: Well developed, well  nourished, NAD, appears stated age  29: NCAT,mucous membranes moist.  Using nonrebreather mask   Cardiovascular: S1 S2 auscultated, RRR, no murmurs  Respiratory: Clear to auscultation anteriorly  Abdomen: Soft,obese  nontender, nondistended, + bowel sounds  Extremities: warm dry without cyanosis clubbing. +LE edema  Neuro: AAOx3, Nonfocal  Psych: Normal affect and demeanor   Data Review   Micro Results Recent Results (from the past 240 hour(s))  Culture, blood (routine x 2)     Status: None (Preliminary result)   Collection Time: 10/18/14  5:15 PM  Result Value Ref Range Status   Specimen Description BLOOD RIGHT ASSIST CONTROL  Final   Special Requests BOTTLES DRAWN AEROBIC AND ANAEROBIC  1CC  Final   Culture NO GROWTH 4 DAYS  Final   Report Status PENDING  Incomplete  Culture, blood (routine x 2)     Status: None (Preliminary result)   Collection Time: 10/18/14  5:30 PM  Result Value Ref Range Status   Specimen Description BLOOD LEFT ASSIST CONTROL  Final   Special Requests   Final    BOTTLES DRAWN AEROBIC AND ANAEROBIC  AER 4CC ANA 1CC   Culture NO GROWTH 4 DAYS  Final   Report Status PENDING  Incomplete  MRSA PCR Screening     Status: None   Collection Time: 10/18/14  8:15 PM  Result Value Ref Range Status   MRSA by PCR NEGATIVE NEGATIVE Final    Comment:        The GeneXpert MRSA Assay (FDA approved for NASAL specimens only), is one component of a comprehensive MRSA colonization surveillance program. It is not intended to diagnose MRSA infection nor to guide or monitor treatment for MRSA infections.     Radiology Reports Dg Chest 2 View  10/18/2014   CLINICAL DATA:  Short of breath for 1 week.  EXAM: CHEST  2 VIEW  COMPARISON:  None.  FINDINGS: NORMAL CARDIAC SILHOUETTE. THERE IS FINE AIRSPACE DISEASE IN LEFT UPPER LOBE. No pleural fluid. No pneumothorax. Degenerative osteophytosis of the thoracic spine.  IMPRESSION: LEFT upper lobe pneumonia.    Electronically Signed   By: Suzy Bouchard M.D.   On: 10/18/2014 15:32   Ct Angio Chest Pe W/cm &/or Wo Cm  10/18/2014   CLINICAL DATA:  49 year old female with sudden onset shortness of breath. Possible left upper lung pneumonia on chest radiographs. Current history of breast cancer. Subsequent encounter.  EXAM: CT ANGIOGRAPHY CHEST WITH CONTRAST  TECHNIQUE: Multidetector CT imaging of the chest was performed using the standard protocol  during bolus administration of intravenous contrast. Multiplanar CT image reconstructions and MIPs were obtained to evaluate the vascular anatomy.  CONTRAST:  50mL OMNIPAQUE IOHEXOL 350 MG/ML SOLN  COMPARISON:  Chest radiographs 1514 hr today.  FINDINGS: Good contrast bolus timing in the pulmonary arterial tree. Positive for bilateral acute pulmonary embolus. Distal main pulmonary artery thrombus bilaterally. Confluent segmental thrombus greater on the right.  Left ventricular hypertrophy.  RV / LV ratio greater than 1.  Cardiomegaly. No pericardial effusion. No pleural effusion. Patchy and confluent left greater than right peribronchovascular opacity about the hila. Mild superimposed scattered ground-glass opacity, resembling mosaic attenuation in the lower lobes. Major airways are patent.  No mediastinal lymphadenopathy. Mild calcified aortic atherosclerosis. There is some evidence of coronary artery calcified plaque. No axillary lymphadenopathy. Negative visualized thoracic inlet.  Visualized liver, spleen, and bowel in the upper abdomen are within normal limits.  Degenerative changes in the thoracic spine. No acute or suspicious osseous lesion identified.  Review of the MIP images confirms the above findings.  IMPRESSION: 1. Positive for acute PE with CT evidence of right heart strain (RV/LV Ratio > 1) consistent with at least submassive (intermediate risk) PE. The presence of right heart strain has been associated with an increased risk of morbidity and mortality. Please  activate Code PE by paging [phone]. 2. Critical Value/emergent results were called by telephone at the time of interpretation on 10/18/2014 at 4:53 pm to Dr. Conni Slipper , who verbally acknowledged these results. 3. Patchy perihilar opacity is nonspecific, suspicious for infarction. No pleural or pericardial effusion.   Electronically Signed   By: Genevie Ann M.D.   On: 10/18/2014 16:55   Ir Angiogram Pulmonary Bilateral Selective  10/19/2014   CLINICAL DATA:  49 year old female with sub massive bilateral pulmonary emboli and evidence of right heart strain both by imaging and bio markers (elevated troponins). She presents for bilateral catheter directed ultrasound accelerated thrombolysis with EKOS.  EXAM: BILATERAL PULMONARY ARTERIOGRAPHY; IR INFUSION THROMBOL ARTERIAL INITIAL (MS); IR ULTRASOUND GUIDANCE VASC ACCESS RIGHT; ADDITIONAL ARTERIOGRAPHY  Date: 10/19/2014  PROCEDURE: 1. Ultrasound-guided puncture of the right common femoral vein 2. Second ultrasound-guided puncture right common femoral vein 3. Catheterization left main pulmonary artery with pulmonary arteriogram 4. Catheterization of the main pulmonary artery with pressure measurement 5. Catheterization of the right pulmonary artery with pulmonary arteriogram 6. Placement of 12 cm infusion catheter in the left lower lobe pulmonary artery 7. Placement of 12 cm infusion catheter in the right lower lobe pulmonary artery 8. Initiation of bilateral pulmonary arterial thrombolysis Interventional Radiologist:  Criselda Peaches, MD  ANESTHESIA/SEDATION: Moderate (conscious) sedation was used. 1 mg Versed, 50 mcg Fentanyl were administered intravenously. The patient's vital signs were monitored continuously by radiology nursing throughout the procedure.  Sedation Time: 20 minutes  MEDICATIONS: None additional  FLUOROSCOPY TIME:  6 minutes 48 seconds  423.9 mGy  CONTRAST:  91mL OMNIPAQUE IOHEXOL 300 MG/ML  SOLN  TECHNIQUE: Informed consent was obtained from the  patient following explanation of the procedure, risks, benefits and alternatives. The patient understands, agrees and consents for the procedure. All questions were addressed. A time out was performed.  Maximal barrier sterile technique utilized including caps, mask, sterile gowns, sterile gloves, large sterile drape, hand hygiene, and Betadine skin prep.  The right groin was interrogated with ultrasound. The common femoral vein was easily identified. The vein is engorged but free of thrombus and compressible. Engorgement suggests elevated right heart pressures consistent with the clinical history. Local anesthesia  was attained by infiltration with 1% lidocaine. Two small dermatotomy is were made.  Under real-time sonographic guidance, the common femoral vein was punctured with a 21 gauge micropuncture needle. With the assistance of a transitional micro sheath, the initial micro wire was exchanged for a 0.035 inch wire and a micro sheath exchanged for a working 6 [place] vascular sheath. Using the same technique, the vessel was punctured a second time with a separate 21 gauge micropuncture needle and a second 6 [place] working vascular sheath was introduced into the common femoral vein. Ultrasound images were documented for the medical record.  Through the first sheath, an angled catheter was advanced over a Bentson wire into the right heart. The catheter was used to select the left main pulmonary artery. A left pulmonary arteriogram was performed with a hand injection. There is significant pruning of the pulmonary arterial vasculature is consistent with multifocal pulmonary emboli. Filling defects are present in the upper and lower lobar arteries using a glidewire, the distal lower lobe artery was catheterized. The glidewire was exchanged for a Rosen wire. The 5 French catheter was left in place.  Through the second common femoral sheath a second angled catheter was advanced over a Bentson wire into the right heart  in used to select the main pulmonary artery. Pulmonary arterial pressures were then obtained. The mean pulmonary artery pressure is 54/26 (mean 36) mm Hg. The catheter was advanced into the right main pulmonary artery and main pulmonary arteriogram was performed. There is significant thrombus throughout the right pulmonary arterial tree involving both the upper and lower lobes. Using a glidewire, the catheter was advanced into the distal right lower lobe. The glidewire was exchanged for a Rosen wire.  Dual 12 cm EKOS multi side hole ultrasound assisted infusion catheters were selected and prepared. The catheters were advanced over the Rosen wires in position within the right and left main and lower lobe pulmonary arteries. An image was obtained documenting catheter position. Pulmonary arterial thrombolysis was then initiated at a rate of 1 milligram/hour per catheter for a total of 2 milligrams/hour. The catheters were secured and sterile bandages applied. The patient tolerated the procedure well.  COMPLICATIONS: None  Estimated blood loss:  0  IMPRESSION: 1. Main pulmonary arterial mean pressure is 38 mmHg consistent with pulmonary arterial hypertension. 2. Initiation of bilateral ultrasound assisted pulmonary artery thrombolysis with EKOS.  Signed,  Criselda Peaches, MD  [facility]  [facility]   Electronically Signed   By: Jacqulynn Cadet M.D.   On: 10/19/2014 09:12   Ir Angiogram Selective Each Additional Vessel  10/19/2014   CLINICAL DATA:  49 year old female with sub massive bilateral pulmonary emboli and evidence of right heart strain both by imaging and bio markers (elevated troponins). She presents for bilateral catheter directed ultrasound accelerated thrombolysis with EKOS.  EXAM: BILATERAL PULMONARY ARTERIOGRAPHY; IR INFUSION THROMBOL ARTERIAL INITIAL (MS); IR ULTRASOUND GUIDANCE VASC ACCESS RIGHT; ADDITIONAL ARTERIOGRAPHY  Date: 10/19/2014  PROCEDURE:  1. Ultrasound-guided puncture of the right common femoral vein 2. Second ultrasound-guided puncture right common femoral vein 3. Catheterization left main pulmonary artery with pulmonary arteriogram 4. Catheterization of the main pulmonary artery with pressure measurement 5. Catheterization of the right pulmonary artery with pulmonary arteriogram 6. Placement of 12 cm infusion catheter in the left lower lobe pulmonary artery 7. Placement of 12 cm infusion catheter in the right lower lobe pulmonary artery 8. Initiation of bilateral pulmonary arterial thrombolysis Interventional Radiologist:  Criselda Peaches,  MD  ANESTHESIA/SEDATION: Moderate (conscious) sedation was used. 1 mg Versed, 50 mcg Fentanyl were administered intravenously. The patient's vital signs were monitored continuously by radiology nursing throughout the procedure.  Sedation Time: 20 minutes  MEDICATIONS: None additional  FLUOROSCOPY TIME:  6 minutes 48 seconds  423.9 mGy  CONTRAST:  21mL OMNIPAQUE IOHEXOL 300 MG/ML  SOLN  TECHNIQUE: Informed consent was obtained from the patient following explanation of the procedure, risks, benefits and alternatives. The patient understands, agrees and consents for the procedure. All questions were addressed. A time out was performed.  Maximal barrier sterile technique utilized including caps, mask, sterile gowns, sterile gloves, large sterile drape, hand hygiene, and Betadine skin prep.  The right groin was interrogated with ultrasound. The common femoral vein was easily identified. The vein is engorged but free of thrombus and compressible. Engorgement suggests elevated right heart pressures consistent with the clinical history. Local anesthesia was attained by infiltration with 1% lidocaine. Two small dermatotomy is were made.  Under real-time sonographic guidance, the common femoral vein was punctured with a 21 gauge micropuncture needle. With the assistance of a transitional micro sheath, the initial micro  wire was exchanged for a 0.035 inch wire and a micro sheath exchanged for a working 6 [place] vascular sheath. Using the same technique, the vessel was punctured a second time with a separate 21 gauge micropuncture needle and a second 6 [place] working vascular sheath was introduced into the common femoral vein. Ultrasound images were documented for the medical record.  Through the first sheath, an angled catheter was advanced over a Bentson wire into the right heart. The catheter was used to select the left main pulmonary artery. A left pulmonary arteriogram was performed with a hand injection. There is significant pruning of the pulmonary arterial vasculature is consistent with multifocal pulmonary emboli. Filling defects are present in the upper and lower lobar arteries using a glidewire, the distal lower lobe artery was catheterized. The glidewire was exchanged for a Rosen wire. The 5 French catheter was left in place.  Through the second common femoral sheath a second angled catheter was advanced over a Bentson wire into the right heart in used to select the main pulmonary artery. Pulmonary arterial pressures were then obtained. The mean pulmonary artery pressure is 54/26 (mean 36) mm Hg. The catheter was advanced into the right main pulmonary artery and main pulmonary arteriogram was performed. There is significant thrombus throughout the right pulmonary arterial tree involving both the upper and lower lobes. Using a glidewire, the catheter was advanced into the distal right lower lobe. The glidewire was exchanged for a Rosen wire.  Dual 12 cm EKOS multi side hole ultrasound assisted infusion catheters were selected and prepared. The catheters were advanced over the Rosen wires in position within the right and left main and lower lobe pulmonary arteries. An image was obtained documenting catheter position. Pulmonary arterial thrombolysis was then initiated at a rate of 1 milligram/hour per catheter for a total  of 2 milligrams/hour. The catheters were secured and sterile bandages applied. The patient tolerated the procedure well.  COMPLICATIONS: None  Estimated blood loss:  0  IMPRESSION: 1. Main pulmonary arterial mean pressure is 38 mmHg consistent with pulmonary arterial hypertension. 2. Initiation of bilateral ultrasound assisted pulmonary artery thrombolysis with EKOS.  Signed,  Criselda Peaches, MD  [facility]  [facility] Radiology   Electronically Signed   By: Jacqulynn Cadet M.D.   On: 10/19/2014 09:12   Ir  Angiogram Selective Each Additional Vessel  10/19/2014   CLINICAL DATA:  49 year old female with sub massive bilateral pulmonary emboli and evidence of right heart strain both by imaging and bio markers (elevated troponins). She presents for bilateral catheter directed ultrasound accelerated thrombolysis with EKOS.  EXAM: BILATERAL PULMONARY ARTERIOGRAPHY; IR INFUSION THROMBOL ARTERIAL INITIAL (MS); IR ULTRASOUND GUIDANCE VASC ACCESS RIGHT; ADDITIONAL ARTERIOGRAPHY  Date: 10/19/2014  PROCEDURE: 1. Ultrasound-guided puncture of the right common femoral vein 2. Second ultrasound-guided puncture right common femoral vein 3. Catheterization left main pulmonary artery with pulmonary arteriogram 4. Catheterization of the main pulmonary artery with pressure measurement 5. Catheterization of the right pulmonary artery with pulmonary arteriogram 6. Placement of 12 cm infusion catheter in the left lower lobe pulmonary artery 7. Placement of 12 cm infusion catheter in the right lower lobe pulmonary artery 8. Initiation of bilateral pulmonary arterial thrombolysis Interventional Radiologist:  Criselda Peaches, MD  ANESTHESIA/SEDATION: Moderate (conscious) sedation was used. 1 mg Versed, 50 mcg Fentanyl were administered intravenously. The patient's vital signs were monitored continuously by radiology nursing throughout the procedure.  Sedation Time: 20 minutes  MEDICATIONS:  None additional  FLUOROSCOPY TIME:  6 minutes 48 seconds  423.9 mGy  CONTRAST:  68mL OMNIPAQUE IOHEXOL 300 MG/ML  SOLN  TECHNIQUE: Informed consent was obtained from the patient following explanation of the procedure, risks, benefits and alternatives. The patient understands, agrees and consents for the procedure. All questions were addressed. A time out was performed.  Maximal barrier sterile technique utilized including caps, mask, sterile gowns, sterile gloves, large sterile drape, hand hygiene, and Betadine skin prep.  The right groin was interrogated with ultrasound. The common femoral vein was easily identified. The vein is engorged but free of thrombus and compressible. Engorgement suggests elevated right heart pressures consistent with the clinical history. Local anesthesia was attained by infiltration with 1% lidocaine. Two small dermatotomy is were made.  Under real-time sonographic guidance, the common femoral vein was punctured with a 21 gauge micropuncture needle. With the assistance of a transitional micro sheath, the initial micro wire was exchanged for a 0.035 inch wire and a micro sheath exchanged for a working 6 [place] vascular sheath. Using the same technique, the vessel was punctured a second time with a separate 21 gauge micropuncture needle and a second 6 [place] working vascular sheath was introduced into the common femoral vein. Ultrasound images were documented for the medical record.  Through the first sheath, an angled catheter was advanced over a Bentson wire into the right heart. The catheter was used to select the left main pulmonary artery. A left pulmonary arteriogram was performed with a hand injection. There is significant pruning of the pulmonary arterial vasculature is consistent with multifocal pulmonary emboli. Filling defects are present in the upper and lower lobar arteries using a glidewire, the distal lower lobe artery was catheterized. The glidewire was exchanged for a Rosen  wire. The 5 French catheter was left in place.  Through the second common femoral sheath a second angled catheter was advanced over a Bentson wire into the right heart in used to select the main pulmonary artery. Pulmonary arterial pressures were then obtained. The mean pulmonary artery pressure is 54/26 (mean 36) mm Hg. The catheter was advanced into the right main pulmonary artery and main pulmonary arteriogram was performed. There is significant thrombus throughout the right pulmonary arterial tree involving both the upper and lower lobes. Using a glidewire, the catheter was advanced into the distal right lower lobe. The  glidewire was exchanged for a [employer] wire.  Dual 12 cm EKOS multi side hole ultrasound assisted infusion catheters were selected and prepared. The catheters were advanced over the Rosen wires in position within the right and left main and lower lobe pulmonary arteries. An image was obtained documenting catheter position. Pulmonary arterial thrombolysis was then initiated at a rate of 1 milligram/hour per catheter for a total of 2 milligrams/hour. The catheters were secured and sterile bandages applied. The patient tolerated the procedure well.  COMPLICATIONS: None  Estimated blood loss:  0  IMPRESSION: 1. Main pulmonary arterial mean pressure is 38 mmHg consistent with pulmonary arterial hypertension. 2. Initiation of bilateral ultrasound assisted pulmonary artery thrombolysis with EKOS.  Signed,  Criselda Peaches, MD  [facility]  [facility]   Electronically Signed   By: Jacqulynn Cadet M.D.   On: 10/19/2014 09:12   US Renal  10/20/2014   CLINICAL DATA:  Known pulmonary embolism and hypertension  EXAM: RENAL / URINARY TRACT ULTRASOUND COMPLETE  COMPARISON:  None.  FINDINGS: Right Kidney:  Length: 10.0 cm. Echogenicity within normal limits. No mass or hydronephrosis visualized. Normal vascular flow is noted.  Left Kidney:  Length: 13.3 cm.  Echogenicity within normal limits. No mass or hydronephrosis visualized. Normal vascular flow is noted.  Bladder:  Appears normal for degree of bladder distention.  IMPRESSION: Normal-appearing kidneys. Normal vascularity is noted on this limited exam. This does not constitute a full renal Doppler examination.   Electronically Signed   By: Inez Catalina M.D.   On: 10/20/2014 21:09   Ir US Guide Vasc Access Right  10/19/2014   CLINICAL DATA:  49 year old female with sub massive bilateral pulmonary emboli and evidence of right heart strain both by imaging and bio markers (elevated troponins). She presents for bilateral catheter directed ultrasound accelerated thrombolysis with EKOS.  EXAM: BILATERAL PULMONARY ARTERIOGRAPHY; IR INFUSION THROMBOL ARTERIAL INITIAL (MS); IR ULTRASOUND GUIDANCE VASC ACCESS RIGHT; ADDITIONAL ARTERIOGRAPHY  Date: 10/19/2014  PROCEDURE: 1. Ultrasound-guided puncture of the right common femoral vein 2. Second ultrasound-guided puncture right common femoral vein 3. Catheterization left main pulmonary artery with pulmonary arteriogram 4. Catheterization of the main pulmonary artery with pressure measurement 5. Catheterization of the right pulmonary artery with pulmonary arteriogram 6. Placement of 12 cm infusion catheter in the left lower lobe pulmonary artery 7. Placement of 12 cm infusion catheter in the right lower lobe pulmonary artery 8. Initiation of bilateral pulmonary arterial thrombolysis Interventional Radiologist:  Criselda Peaches, MD  ANESTHESIA/SEDATION: Moderate (conscious) sedation was used. 1 mg Versed, 50 mcg Fentanyl were administered intravenously. The patient's vital signs were monitored continuously by radiology nursing throughout the procedure.  Sedation Time: 20 minutes  MEDICATIONS: None additional  FLUOROSCOPY TIME:  6 minutes 48 seconds  423.9 mGy  CONTRAST:  38mL OMNIPAQUE IOHEXOL 300 MG/ML  SOLN  TECHNIQUE: Informed consent was obtained from the patient following  explanation of the procedure, risks, benefits and alternatives. The patient understands, agrees and consents for the procedure. All questions were addressed. A time out was performed.  Maximal barrier sterile technique utilized including caps, mask, sterile gowns, sterile gloves, large sterile drape, hand hygiene, and Betadine skin prep.  The right groin was interrogated with ultrasound. The common femoral vein was easily identified. The vein is engorged but free of thrombus and compressible. Engorgement suggests elevated right heart pressures consistent with the clinical history. Local anesthesia was attained by infiltration with 1% lidocaine. Two small dermatotomy is were  made.  Under real-time sonographic guidance, the common femoral vein was punctured with a 21 gauge micropuncture needle. With the assistance of a transitional micro sheath, the initial micro wire was exchanged for a 0.035 inch wire and a micro sheath exchanged for a working 6 [place] vascular sheath. Using the same technique, the vessel was punctured a second time with a separate 21 gauge micropuncture needle and a second 6 [place] working vascular sheath was introduced into the common femoral vein. Ultrasound images were documented for the medical record.  Through the first sheath, an angled catheter was advanced over a Bentson wire into the right heart. The catheter was used to select the left main pulmonary artery. A left pulmonary arteriogram was performed with a hand injection. There is significant pruning of the pulmonary arterial vasculature is consistent with multifocal pulmonary emboli. Filling defects are present in the upper and lower lobar arteries using a glidewire, the distal lower lobe artery was catheterized. The glidewire was exchanged for a Rosen wire. The 5 French catheter was left in place.  Through the second common femoral sheath a second angled catheter was advanced over a Bentson wire into the right heart in used to select  the main pulmonary artery. Pulmonary arterial pressures were then obtained. The mean pulmonary artery pressure is 54/26 (mean 36) mm Hg. The catheter was advanced into the right main pulmonary artery and main pulmonary arteriogram was performed. There is significant thrombus throughout the right pulmonary arterial tree involving both the upper and lower lobes. Using a glidewire, the catheter was advanced into the distal right lower lobe. The glidewire was exchanged for a Rosen wire.  Dual 12 cm EKOS multi side hole ultrasound assisted infusion catheters were selected and prepared. The catheters were advanced over the Rosen wires in position within the right and left main and lower lobe pulmonary arteries. An image was obtained documenting catheter position. Pulmonary arterial thrombolysis was then initiated at a rate of 1 milligram/hour per catheter for a total of 2 milligrams/hour. The catheters were secured and sterile bandages applied. The patient tolerated the procedure well.  COMPLICATIONS: None  Estimated blood loss:  0  IMPRESSION: 1. Main pulmonary arterial mean pressure is 38 mmHg consistent with pulmonary arterial hypertension. 2. Initiation of bilateral ultrasound assisted pulmonary artery thrombolysis with EKOS.  Signed,  Criselda Peaches, MD  [facility]  [facility]   Electronically Signed   By: Jacqulynn Cadet M.D.   On: 10/19/2014 09:12   Ir Infusion Thrombol Arterial Initial (ms)  10/19/2014   CLINICAL DATA:  49 year old female with sub massive bilateral pulmonary emboli and evidence of right heart strain both by imaging and bio markers (elevated troponins). She presents for bilateral catheter directed ultrasound accelerated thrombolysis with EKOS.  EXAM: BILATERAL PULMONARY ARTERIOGRAPHY; IR INFUSION THROMBOL ARTERIAL INITIAL (MS); IR ULTRASOUND GUIDANCE VASC ACCESS RIGHT; ADDITIONAL ARTERIOGRAPHY  Date: 10/19/2014  PROCEDURE: 1. Ultrasound-guided  puncture of the right common femoral vein 2. Second ultrasound-guided puncture right common femoral vein 3. Catheterization left main pulmonary artery with pulmonary arteriogram 4. Catheterization of the main pulmonary artery with pressure measurement 5. Catheterization of the right pulmonary artery with pulmonary arteriogram 6. Placement of 12 cm infusion catheter in the left lower lobe pulmonary artery 7. Placement of 12 cm infusion catheter in the right lower lobe pulmonary artery 8. Initiation of bilateral pulmonary arterial thrombolysis Interventional Radiologist:  Criselda Peaches, MD  ANESTHESIA/SEDATION: Moderate (conscious) sedation was used. 1 mg Versed, 50  mcg Fentanyl were administered intravenously. The patient's vital signs were monitored continuously by radiology nursing throughout the procedure.  Sedation Time: 20 minutes  MEDICATIONS: None additional  FLUOROSCOPY TIME:  6 minutes 48 seconds  423.9 mGy  CONTRAST:  58mL OMNIPAQUE IOHEXOL 300 MG/ML  SOLN  TECHNIQUE: Informed consent was obtained from the patient following explanation of the procedure, risks, benefits and alternatives. The patient understands, agrees and consents for the procedure. All questions were addressed. A time out was performed.  Maximal barrier sterile technique utilized including caps, mask, sterile gowns, sterile gloves, large sterile drape, hand hygiene, and Betadine skin prep.  The right groin was interrogated with ultrasound. The common femoral vein was easily identified. The vein is engorged but free of thrombus and compressible. Engorgement suggests elevated right heart pressures consistent with the clinical history. Local anesthesia was attained by infiltration with 1% lidocaine. Two small dermatotomy is were made.  Under real-time sonographic guidance, the common femoral vein was punctured with a 21 gauge micropuncture needle. With the assistance of a transitional micro sheath, the initial micro wire was exchanged  for a 0.035 inch wire and a micro sheath exchanged for a working 6 [place] vascular sheath. Using the same technique, the vessel was punctured a second time with a separate 21 gauge micropuncture needle and a second 6 [place] working vascular sheath was introduced into the common femoral vein. Ultrasound images were documented for the medical record.  Through the first sheath, an angled catheter was advanced over a Bentson wire into the right heart. The catheter was used to select the left main pulmonary artery. A left pulmonary arteriogram was performed with a hand injection. There is significant pruning of the pulmonary arterial vasculature is consistent with multifocal pulmonary emboli. Filling defects are present in the upper and lower lobar arteries using a glidewire, the distal lower lobe artery was catheterized. The glidewire was exchanged for a Rosen wire. The 5 French catheter was left in place.  Through the second common femoral sheath a second angled catheter was advanced over a Bentson wire into the right heart in used to select the main pulmonary artery. Pulmonary arterial pressures were then obtained. The mean pulmonary artery pressure is 54/26 (mean 36) mm Hg. The catheter was advanced into the right main pulmonary artery and main pulmonary arteriogram was performed. There is significant thrombus throughout the right pulmonary arterial tree involving both the upper and lower lobes. Using a glidewire, the catheter was advanced into the distal right lower lobe. The glidewire was exchanged for a Rosen wire.  Dual 12 cm EKOS multi side hole ultrasound assisted infusion catheters were selected and prepared. The catheters were advanced over the Rosen wires in position within the right and left main and lower lobe pulmonary arteries. An image was obtained documenting catheter position. Pulmonary arterial thrombolysis was then initiated at a rate of 1 milligram/hour per catheter for a total of 2  milligrams/hour. The catheters were secured and sterile bandages applied. The patient tolerated the procedure well.  COMPLICATIONS: None  Estimated blood loss:  0  IMPRESSION: 1. Main pulmonary arterial mean pressure is 38 mmHg consistent with pulmonary arterial hypertension. 2. Initiation of bilateral ultrasound assisted pulmonary artery thrombolysis with EKOS.  Signed,  Criselda Peaches, MD  [facility]  [facility]   Electronically Signed   By: Jacqulynn Cadet M.D.   On: 10/19/2014 09:12   Ir Infusion Thrombol Arterial Initial (ms)  10/19/2014   CLINICAL DATA:  49 year old female with sub massive bilateral pulmonary emboli and evidence of right heart strain both by imaging and bio markers (elevated troponins). She presents for bilateral catheter directed ultrasound accelerated thrombolysis with EKOS.  EXAM: BILATERAL PULMONARY ARTERIOGRAPHY; IR INFUSION THROMBOL ARTERIAL INITIAL (MS); IR ULTRASOUND GUIDANCE VASC ACCESS RIGHT; ADDITIONAL ARTERIOGRAPHY  Date: 10/19/2014  PROCEDURE: 1. Ultrasound-guided puncture of the right common femoral vein 2. Second ultrasound-guided puncture right common femoral vein 3. Catheterization left main pulmonary artery with pulmonary arteriogram 4. Catheterization of the main pulmonary artery with pressure measurement 5. Catheterization of the right pulmonary artery with pulmonary arteriogram 6. Placement of 12 cm infusion catheter in the left lower lobe pulmonary artery 7. Placement of 12 cm infusion catheter in the right lower lobe pulmonary artery 8. Initiation of bilateral pulmonary arterial thrombolysis Interventional Radiologist:  Criselda Peaches, MD  ANESTHESIA/SEDATION: Moderate (conscious) sedation was used. 1 mg Versed, 50 mcg Fentanyl were administered intravenously. The patient's vital signs were monitored continuously by radiology nursing throughout the procedure.  Sedation Time: 20 minutes  MEDICATIONS: None  additional  FLUOROSCOPY TIME:  6 minutes 48 seconds  423.9 mGy  CONTRAST:  69mL OMNIPAQUE IOHEXOL 300 MG/ML  SOLN  TECHNIQUE: Informed consent was obtained from the patient following explanation of the procedure, risks, benefits and alternatives. The patient understands, agrees and consents for the procedure. All questions were addressed. A time out was performed.  Maximal barrier sterile technique utilized including caps, mask, sterile gowns, sterile gloves, large sterile drape, hand hygiene, and Betadine skin prep.  The right groin was interrogated with ultrasound. The common femoral vein was easily identified. The vein is engorged but free of thrombus and compressible. Engorgement suggests elevated right heart pressures consistent with the clinical history. Local anesthesia was attained by infiltration with 1% lidocaine. Two small dermatotomy is were made.  Under real-time sonographic guidance, the common femoral vein was punctured with a 21 gauge micropuncture needle. With the assistance of a transitional micro sheath, the initial micro wire was exchanged for a 0.035 inch wire and a micro sheath exchanged for a working 6 [place] vascular sheath. Using the same technique, the vessel was punctured a second time with a separate 21 gauge micropuncture needle and a second 6 [place] working vascular sheath was introduced into the common femoral vein. Ultrasound images were documented for the medical record.  Through the first sheath, an angled catheter was advanced over a Bentson wire into the right heart. The catheter was used to select the left main pulmonary artery. A left pulmonary arteriogram was performed with a hand injection. There is significant pruning of the pulmonary arterial vasculature is consistent with multifocal pulmonary emboli. Filling defects are present in the upper and lower lobar arteries using a glidewire, the distal lower lobe artery was catheterized. The glidewire was exchanged for a Rosen  wire. The 5 French catheter was left in place.  Through the second common femoral sheath a second angled catheter was advanced over a Bentson wire into the right heart in used to select the main pulmonary artery. Pulmonary arterial pressures were then obtained. The mean pulmonary artery pressure is 54/26 (mean 36) mm Hg. The catheter was advanced into the right main pulmonary artery and main pulmonary arteriogram was performed. There is significant thrombus throughout the right pulmonary arterial tree involving both the upper and lower lobes. Using a glidewire, the catheter was advanced into the distal right lower lobe. The glidewire was exchanged for a Rosen wire.  Dual 12 cm EKOS  multi side hole ultrasound assisted infusion catheters were selected and prepared. The catheters were advanced over the Rosen wires in position within the right and left main and lower lobe pulmonary arteries. An image was obtained documenting catheter position. Pulmonary arterial thrombolysis was then initiated at a rate of 1 milligram/hour per catheter for a total of 2 milligrams/hour. The catheters were secured and sterile bandages applied. The patient tolerated the procedure well.  COMPLICATIONS: None  Estimated blood loss:  0  IMPRESSION: 1. Main pulmonary arterial mean pressure is 38 mmHg consistent with pulmonary arterial hypertension. 2. Initiation of bilateral ultrasound assisted pulmonary artery thrombolysis with EKOS.  Signed,  Criselda Peaches, MD  [facility]  [facility]   Electronically Signed   By: Jacqulynn Cadet M.D.   On: 10/19/2014 09:12   Ir Jacolyn Reedy F/u Eval Art/ven Final Day (ms)  10/19/2014   INDICATION: History of sub massive pulmonary embolism, post initiation of bilateral ultrasound assisted pulmonary arterial thrombolysis.  Patient returns to the Interventional Radiology suite for completion pulmonary arterial pressure measurements.  EXAM: IR THROMB F/U EVAL  ART/VEN FINAL DAY  COMPARISON:  Initiation of bilateral ultrasound assisted pulmonary arterial thrombolysis - 10/18/2014; chest CTA -10/18/2014  MEDICATIONS: None.  CONTRAST:  None  FLUOROSCOPY TIME:  6 seconds.  COMPLICATIONS: None immediate  TECHNIQUE: The patient was positioned supine on the fluoroscopy table. A preprocedural spot fluoroscopic image was obtained of the chest.  The infusion wire of right pulmonary arterial infusion catheter was removed and the catheter was retracted to the level of the main pulmonary artery. Pulmonary arterial pressure measurements were obtained through the gland porch of the infusion catheter. This procedure was repeated for the contralateral left pulmonary arterial infusion catheter.  Pressure measurement results were discussed with referring physician, Dr. Nelda Marseille, and the decision was made to terminate the procedure.  As such, all wires catheters and sheaths were removed from the patient. Hemostasis was achieved at the right groin access site with manual compression. A dressing was placed. The patient tolerated the procedure well without immediate postprocedural complication.  FINDINGS: Fluoroscopic imaging demonstrates unchanged positioning of the bilateral pulmonary arterial ultrasound assisted infusion catheters with distal radiopaque tips overlying expected location of bilateral subsegmental pulmonary arteries.  Main pulmonary arterial pressure measurements at the time of pulmonary arterial initiation - 54/26 mm Hg (mean - 36).  Postprocedural main pulmonary arterial measurements obtained via the right pulmonary arterial infusion catheter: 54/28 mm Hg (mean - 37)  Postprocedural main pulmonary arterial measurements obtained via the left pulmonary arterial infusion catheter: 51/27 mm Hg (mean - 36)  IMPRESSION: No significant reduction in main pulmonary arterial pressure following 12 hours of ultrasound assisted pulmonary arterial thrombolysis. This was discussed with  referring physician, Dr. Nelda Marseille, and the decision was made to terminate the procedure, as it is believed the patient has chronic pre-existing pulmonary arterial hypertension and sleep apnea and as such, continued thrombolysis is not indicated.  Above discussed with Dr. Nelda Marseille at the time of procedure completion.   Electronically Signed   By: Sandi Mariscal M.D.   On: 10/19/2014 13:10    CBC  Recent Labs Lab 10/18/14 1503  10/19/14 1128 10/19/14 1645 10/19/14 2311 10/20/14 0452 10/22/14 0818  WBC 17.7*  < > 10.1 9.1 9.1 9.4 8.9  HGB 11.4*  < > 9.9* 9.8* 10.3* 9.8* 10.1*  HCT 37.1  < > 31.6* 31.6* 33.2* 33.0* 33.0*  PLT 199  < > 149* 148* 159 158 163  MCV 86.9  < > 88.5 88.3 88.5 89.9 88.7  MCH 26.6  < > 27.7 27.4 27.5 26.7 27.2  MCHC 30.7*  < > 31.3 31.0 31.0 29.7* 30.6  RDW 17.1*  < > 16.0* 16.0* 16.0* 16.1* 15.6*  LYMPHSABS 1.3  --   --   --   --   --   --   MONOABS 1.5*  --   --   --   --   --   --   EOSABS 0.0  --   --   --   --   --   --   BASOSABS 0.1  --   --   --   --   --   --   < > = values in this interval not displayed.  Chemistries   Recent Labs Lab 10/18/14 1503 10/18/14 2146 10/19/14 0318 10/19/14 0637 10/19/14 1545 10/20/14 0452 10/22/14 0818  NA 137 137 137 136 138 143 141  K 4.6 5.4* 6.0* 5.5* 4.4 4.5 4.3  CL 102 102 104 103 105 108 106  CO2 23 26 24 24 26 28 30   GLUCOSE 200* 128* 123* 103* 119* 106* 101*  BUN 26* 27* 30* 32* 31* 22* 15  CREATININE 1.48* 1.88* 2.31* 2.32* 1.88* 1.36* 0.85  CALCIUM 8.9 9.0 8.2* 8.0* 8.3* 8.4* 8.8*  MG  --   --   --   --   --  1.9 1.6*  AST 66* 305*  --   --   --   --   --   ALT 54 240*  --   --   --   --   --   ALKPHOS 88 76  --   --   --   --   --   BILITOT 0.9 0.6  --   --   --   --   --    ------------------------------------------------------------------------------------------------------------------ estimated creatinine clearance is 114.8 mL/min (by C-G formula based on Cr of  0.85). ------------------------------------------------------------------------------------------------------------------ No results for input(s): HGBA1C in the last 72 hours. ------------------------------------------------------------------------------------------------------------------ No results for input(s): CHOL, HDL, LDLCALC, TRIG, CHOLHDL, LDLDIRECT in the last 72 hours. ------------------------------------------------------------------------------------------------------------------ No results for input(s): TSH, T4TOTAL, T3FREE, THYROIDAB in the last 72 hours.  Invalid input(s): FREET3 ------------------------------------------------------------------------------------------------------------------ No results for input(s): VITAMINB12, FOLATE, FERRITIN, TIBC, IRON, RETICCTPCT in the last 72 hours.  Coagulation profile  Recent Labs Lab 10/18/14 2146  INR 1.56*    No results for input(s): DDIMER in the last 72 hours.  Cardiac Enzymes  Recent Labs Lab 10/18/14 2146 10/19/14 0318 10/19/14 1128  TROPONINI 1.00* 1.00* 0.64*   ------------------------------------------------------------------------------------------------------------------ Invalid input(s): POCBNP    Erique Kaser D.O. on 10/22/2014 at 12:36 PM  Between 7am to 7pm - Pager - [phone]  After 7pm go to [email or web address] - password [email or web address]  And look for the night coverage person covering for me after hours  [facility] Office  [phone]

## 2014-10-22 NOTE — Procedures (Signed)
Pt stated that she can not tolerate cpap while sleeping and does not wish to wear it, will notify RT if anything changes. RT will continue to monitor.

## 2014-10-23 LAB — CULTURE, BLOOD (ROUTINE X 2)
Culture: NO GROWTH
Culture: NO GROWTH

## 2014-10-23 LAB — CARDIOLIPIN ANTIBODIES, IGG, IGM, IGA
Anticardiolipin IgA: <9 APL U/mL (ref 0–11)
Anticardiolipin IgG: <9 GPL U/mL (ref 0–14)
Anticardiolipin IgM: <9 MPL U/mL (ref 0–12)

## 2014-10-23 LAB — PROTEIN C, TOTAL: Protein C, Total: 71 % (ref 70–140)

## 2014-10-23 LAB — GLUCOSE, CAPILLARY
Glucose-Capillary: 121 mg/dL — ABNORMAL HIGH (ref 65–99)
Glucose-Capillary: 146 mg/dL — ABNORMAL HIGH (ref 65–99)
Glucose-Capillary: 83 mg/dL (ref 65–99)
Glucose-Capillary: 97 mg/dL (ref 65–99)

## 2014-10-23 LAB — BASIC METABOLIC PANEL
Anion gap: 8 (ref 5–15)
BUN: 17 mg/dL (ref 6–20)
CO2: 30 mmol/L (ref 22–32)
Calcium: 8.7 mg/dL — ABNORMAL LOW (ref 8.9–10.3)
Chloride: 103 mmol/L (ref 101–111)
Creatinine, Ser: 0.86 mg/dL (ref 0.44–1.00)
GFR calc Af Amer: >60 mL/min (ref >60)
GFR calc non Af Amer: >60 mL/min (ref >60)
Glucose, Bld: 102 mg/dL — ABNORMAL HIGH (ref 65–99)
Potassium: 4.1 mmol/L (ref 3.5–5.1)
Sodium: 141 mmol/L (ref 135–145)

## 2014-10-23 LAB — BETA-2-GLYCOPROTEIN I ABS, IGG/M/A
Beta-2 Glyco I IgG: <9 GPI IgG units (ref 0–20)
Beta-2-Glycoprotein I IgA: <9 GPI IgA units (ref 0–25)
Beta-2-Glycoprotein I IgM: <9 GPI IgM units (ref 0–32)

## 2014-10-23 LAB — CBC
HCT: 34 % — ABNORMAL LOW (ref 36.0–46.0)
Hemoglobin: 10.4 g/dL — ABNORMAL LOW (ref 12.0–15.0)
MCH: 27.1 pg (ref 26.0–34.0)
MCHC: 30.6 g/dL (ref 30.0–36.0)
MCV: 88.5 fL (ref 78.0–100.0)
Platelets: 183 10*3/uL (ref 150–400)
RBC: 3.84 MIL/uL — ABNORMAL LOW (ref 3.87–5.11)
RDW: 15.6 % — ABNORMAL HIGH (ref 11.5–15.5)
WBC: 8.8 10*3/uL (ref 4.0–10.5)

## 2014-10-23 LAB — PROTEIN C ACTIVITY: Protein C Activity: 77 % (ref 74–151)

## 2014-10-23 LAB — PROTEIN S, TOTAL: Protein S Ag, Total: 127 % (ref 58–150)

## 2014-10-23 LAB — PROTHROMBIN GENE MUTATION

## 2014-10-23 LAB — FACTOR 5 LEIDEN

## 2014-10-23 LAB — PROTEIN S ACTIVITY: Protein S Activity: 99 % (ref 60–145)

## 2014-10-23 LAB — LUPUS ANTICOAGULANT PANEL
DRVVT: 46 s (ref 0.0–55.1)
PTT Lupus Anticoagulant: 30.9 s (ref 0.0–50.0)

## 2014-10-23 LAB — HOMOCYSTEINE: Homocysteine: 19.7 umol/L — ABNORMAL HIGH (ref 0.0–15.0)

## 2014-10-23 MED ORDER — RIVAROXABAN 15 MG PO TABS: 15.0000 mg | ORAL_TABLET | Freq: Two times a day (BID) | ORAL | Status: DC

## 2014-10-23 MED ORDER — RIVAROXABAN 20 MG PO TABS: 20.0000 mg | ORAL_TABLET | Freq: Every day | ORAL | Status: DC

## 2014-10-23 NOTE — Progress Notes (Signed)
OT Cancellation Note  Patient Details Name: Nicole Schmidt MRN: [id number] DOB: 1965-02-17   Cancelled Treatment:    Reason Eval/Treat Not Completed: OT screened, no needs identified, will sign off  Pt screened, preparing to D/C home.  Pt and family member report no needs at this time.  [name] 10/23/2014, 3:33 PM

## 2014-10-23 NOTE — Progress Notes (Signed)
Patient discharged home with home O2> patient received and understands education on xaralto with teach method.

## 2014-10-23 NOTE — Discharge Summary (Signed)
Physician Discharge Summary  Nicole Schmidt [id number] DOB: 1965-03-23 DOA: 10/18/2014  PCP: Lenard Simmer, MD  Admit date: 10/18/2014 Discharge date: 10/23/2014  Time spent: 45 minutes  Recommendations for Outpatient Follow-up:  Patient will be discharged to home with home health, PT, SW, Aid, RN.  Patient will need to follow up with primary care provider within one week of discharge.  Patient will need to follow up with Dr. Ashby Dawes in [facility] 10/18 at 10:00 am   Patient should continue medications as prescribed.  Patient should follow a heart healthy diet.   Discharge Diagnoses:  Pulmonary embolism Questionable OSA Elevated troponin Acute on chronic kidney disease, stage III History of breast cancer Hypothyroidism Diabetes mellitus, type II  Discharge Condition: Stable  Diet recommendation: heart healthy diet  Filed Weights   10/20/14 0453 10/21/14 0300 10/22/14 0500  Weight: 154.6 kg (340 lb 13.3 oz) 153.1 kg (337 lb 8.4 oz) 150.2 kg (331 lb 2.1 oz)    History of present illness:  on 10/18/2014 by Dr. Reginia Forts Nicole Schmidt is a 49 y/o woman with past medical history of breast cancer treated with lumpectomy (right sided), lymph node dissection and radiation therapy 2 years ago. She has been on tamoxifen since then. She also has HTN, DM2 and hypothyroidism. She has had a slight cough and mild shortness of breath for about a week. Immediately prior to going to the emergency room she developed severe shortness of breath and lightheadedness. She felt like she might pass out every time she tried to move although she never syncopized. She underwent CTA in the ER which showed large bilateral PEs with evidence of right heart strain. She was admitted and transferred to [facility] for possible catheter directed lytic therapy.   Hospital Course:  Pulmonary Embolism  -Found on CTA and given EKOS lysis  -Placed on heparin and transitioned to Xarelto -Continue supplemental O2,  At rest patient saturations 84%, with ambulation and on 3L, 84%. Patient will need 6L.  -Lower extremity Doppler negative for DVT -had long discussion with patient and her cousin, Nicole Schmidt, regarding follow-up and risk of bleeding with any anticoagulation  ?OSA -Patient will need outpatient sleep study- Dr. Ashby Dawes in [place] for Oct 18th at 10:00 am -Placed on empiric CPAP  Elevated troponin  -Echocardiogram: EF 35-60%, mildly dilated left atrium -Likely secondary to PE  Acute on chronic kidney disease, stage III -Baseline creatinine approximately 1.4-1.5, currently 0.86 -Renal ultrasound: Normal-appearing kidneys, normal vascularity. No mass or hydronephrosis visualized  History of breast cancer -Patient was on tamoxifen, this has been discontinued -Patient will need to follow-up with her oncologist -Hypercoagulable workup is pending however may not be beneficial due to recent PE -Continue Xarelto  Hypothyroidism -Continue Armour  Diabetes mellitus, type II -Was placed on insulin sliding scale and CBG monitoring -Continue her medications upon discharge  Procedures/Events 10/18/2014: CTA showed B/L submassive PE EKOS lysis LE doppler Echocardiogram Renal ultrasound  Consults  Interventional radiology PCCM  Discharge Exam: Filed Vitals:   10/23/14 0517  BP: 124/38  Pulse: 79  Temp: 98.6 F (37 C)  Resp: 18   Exam  General: Well developed, well nourished, NAD  HEENT: NCAT,mucous membranes moist.   Cardiovascular: S1 S2 auscultated, RRR, no murmurs  Respiratory: Clear to auscultation anteriorly  Abdomen: Soft,obese nontender, nondistended, + bowel sounds  Extremities: warm dry without cyanosis clubbing. +LE edema  Neuro: AAOx3, Nonfocal  Psych: Normal affect and demeanor  Discharge Instructions      Discharge Instructions  Discharge instructions    Complete by:  As directed   Patient will be discharged to home with home health, PT, SW,  Aid, RN.  Patient will need to follow up with primary care provider within one week of discharge.  Patient will need to follow up with Dr. Ashby Dawes in [facility] 10/18 at 10:00 am   Patient should continue medications as prescribed.  Patient should follow a heart healthy diet.            Medication List    STOP taking these medications        allopurinol 300 MG tablet  Commonly known as:  ZYLOPRIM     amLODipine 10 MG tablet  Commonly known as:  NORVASC     furosemide 20 MG tablet  Commonly known as:  LASIX     tamoxifen 20 MG tablet  Commonly known as:  NOLVADEX      TAKE these medications        ARMOUR THYROID 60 MG tablet  Generic drug:  thyroid  Take 2 tablets by mouth daily.     CALCIUM 600 + D PO  Take 1 tablet by mouth daily.     CINNAMON PO  Take 2,000 mg by mouth daily.     ergocalciferol 50000 UNITS capsule  Commonly known as:  VITAMIN D2  Take 50,000 Units by mouth once a week. Take on Fridays.     ferrous sulfate 325 (65 FE) MG tablet  Take 325 mg by mouth daily.     folic acid Q000111Q MCG tablet  Commonly known as:  FOLVITE  Take 800 mcg by mouth daily.     lisinopril-hydrochlorothiazide 20-25 MG per tablet  Commonly known as:  PRINZIDE,ZESTORETIC  Take 1 tablet by mouth daily.     metFORMIN 1000 MG tablet  Commonly known as:  GLUCOPHAGE  Take 1 tablet by mouth 2 (two) times daily.     nystatin 100000 UNIT/GM Powd  Apply topically to rash 3 times a day     Rivaroxaban 15 MG Tabs tablet  Commonly known as:  XARELTO  Take 1 tablet (15 mg total) by mouth 2 (two) times daily with a meal.     rivaroxaban 20 MG Tabs tablet  Commonly known as:  XARELTO  Take 1 tablet (20 mg total) by mouth daily with supper.  Start taking on:  11/10/2014       No Known Allergies Follow-up Information    Follow up with [facility].   Why:  For PT and [facility] RN. Will call in next 24-48 hours to set up first [facility] apt.   Contact information:    [address] [place] [address] [phone]       Follow up with [facility].   Why:  home oxygen to be delivered to room prior to discharge   Contact information:   [address] [place] [address] [phone]       Follow up with Sleep disorder clinic at [facility].   Why:  Will call to schedule appointment, referral faxed 10-23-14   Contact information:   [facility]   [place]   [place], [place] [address]  [phone]      Follow up with Lenard Simmer, MD. Schedule an appointment as soon as possible for a visit in 1 week.   Specialty:  Endocrinology   Why:  Hospital follow up   Contact information:   [address]  [place] [address] [phone]       Follow up with Laverle Hobby, MD On 12/01/2014.   Specialty:  Pulmonary Disease   Why:  At 10am   Contact information:   [place] [address] [phone]        The results of significant diagnostics from this hospitalization (including imaging, microbiology, ancillary and laboratory) are listed below for reference.    Significant Diagnostic Studies: Dg Chest 2 View  10/18/2014   CLINICAL DATA:  Short of breath for 1 week.  EXAM: CHEST  2 VIEW  COMPARISON:  None.  FINDINGS: NORMAL CARDIAC SILHOUETTE. THERE IS FINE AIRSPACE DISEASE IN LEFT UPPER LOBE. No pleural fluid. No pneumothorax. Degenerative osteophytosis of the thoracic spine.  IMPRESSION: LEFT upper lobe pneumonia.   Electronically Signed   By: Suzy Bouchard M.D.   On: 10/18/2014 15:32   Ct Angio Chest Pe W/cm &/or Wo Cm  10/18/2014   CLINICAL DATA:  49 year old female with sudden onset shortness of breath. Possible left upper lung pneumonia on chest radiographs. Current history of breast cancer. Subsequent encounter.  EXAM: CT ANGIOGRAPHY CHEST WITH CONTRAST  TECHNIQUE: Multidetector CT imaging of the chest was performed using the standard protocol during bolus  administration of intravenous contrast. Multiplanar CT image reconstructions and MIPs were obtained to evaluate the vascular anatomy.  CONTRAST:  16mL OMNIPAQUE IOHEXOL 350 MG/ML SOLN  COMPARISON:  Chest radiographs 1514 hr today.  FINDINGS: Good contrast bolus timing in the pulmonary arterial tree. Positive for bilateral acute pulmonary embolus. Distal main pulmonary artery thrombus bilaterally. Confluent segmental thrombus greater on the right.  Left ventricular hypertrophy.  RV / LV ratio greater than 1.  Cardiomegaly. No pericardial effusion. No pleural effusion. Patchy and confluent left greater than right peribronchovascular opacity about the hila. Mild superimposed scattered ground-glass opacity, resembling mosaic attenuation in the lower lobes. Major airways are patent.  No mediastinal lymphadenopathy. Mild calcified aortic atherosclerosis. There is some evidence of coronary artery calcified plaque. No axillary lymphadenopathy. Negative visualized thoracic inlet.  Visualized liver, spleen, and bowel in the upper abdomen are within normal limits.  Degenerative changes in the thoracic spine. No acute or suspicious osseous lesion identified.  Review of the MIP images confirms the above findings.  IMPRESSION: 1. Positive for acute PE with CT evidence of right heart strain (RV/LV Ratio > 1) consistent with at least submassive (intermediate risk) PE. The presence of right heart strain has been associated with an increased risk of morbidity and mortality. Please activate Code PE by paging [phone]. 2. Critical Value/emergent results were called by telephone at the time of interpretation on 10/18/2014 at 4:53 pm to Dr. Conni Slipper , who verbally acknowledged these results. 3. Patchy perihilar opacity is nonspecific, suspicious for infarction. No pleural or pericardial effusion.   Electronically Signed   By: Genevie Ann M.D.   On: 10/18/2014 16:55   Ir Angiogram Pulmonary Bilateral Selective  10/19/2014   CLINICAL  DATA:  49 year old female with sub massive bilateral pulmonary emboli and evidence of right heart strain both by imaging and bio markers (elevated troponins). She presents for bilateral catheter directed ultrasound accelerated thrombolysis with EKOS.  EXAM: BILATERAL PULMONARY ARTERIOGRAPHY; IR INFUSION THROMBOL ARTERIAL INITIAL (MS); IR ULTRASOUND GUIDANCE VASC ACCESS RIGHT; ADDITIONAL ARTERIOGRAPHY  Date: 10/19/2014  PROCEDURE: 1. Ultrasound-guided puncture of the right common femoral vein 2. Second ultrasound-guided puncture right common femoral vein 3. Catheterization left main pulmonary artery with pulmonary arteriogram 4. Catheterization of the main  pulmonary artery with pressure measurement 5. Catheterization of the right pulmonary artery with pulmonary arteriogram 6. Placement of 12 cm infusion catheter in the left lower lobe pulmonary artery 7. Placement of 12 cm infusion catheter in the right lower lobe pulmonary artery 8. Initiation of bilateral pulmonary arterial thrombolysis Interventional Radiologist:  Criselda Peaches, MD  ANESTHESIA/SEDATION: Moderate (conscious) sedation was used. 1 mg Versed, 50 mcg Fentanyl were administered intravenously. The patient's vital signs were monitored continuously by radiology nursing throughout the procedure.  Sedation Time: 20 minutes  MEDICATIONS: None additional  FLUOROSCOPY TIME:  6 minutes 48 seconds  423.9 mGy  CONTRAST:  57mL OMNIPAQUE IOHEXOL 300 MG/ML  SOLN  TECHNIQUE: Informed consent was obtained from the patient following explanation of the procedure, risks, benefits and alternatives. The patient understands, agrees and consents for the procedure. All questions were addressed. A time out was performed.  Maximal barrier sterile technique utilized including caps, mask, sterile gowns, sterile gloves, large sterile drape, hand hygiene, and Betadine skin prep.  The right groin was interrogated with ultrasound. The common femoral vein was easily identified. The  vein is engorged but free of thrombus and compressible. Engorgement suggests elevated right heart pressures consistent with the clinical history. Local anesthesia was attained by infiltration with 1% lidocaine. Two small dermatotomy is were made.  Under real-time sonographic guidance, the common femoral vein was punctured with a 21 gauge micropuncture needle. With the assistance of a transitional micro sheath, the initial micro wire was exchanged for a 0.035 inch wire and a micro sheath exchanged for a working 6 [place] vascular sheath. Using the same technique, the vessel was punctured a second time with a separate 21 gauge micropuncture needle and a second 6 [place] working vascular sheath was introduced into the common femoral vein. Ultrasound images were documented for the medical record.  Through the first sheath, an angled catheter was advanced over a Bentson wire into the right heart. The catheter was used to select the left main pulmonary artery. A left pulmonary arteriogram was performed with a hand injection. There is significant pruning of the pulmonary arterial vasculature is consistent with multifocal pulmonary emboli. Filling defects are present in the upper and lower lobar arteries using a glidewire, the distal lower lobe artery was catheterized. The glidewire was exchanged for a Rosen wire. The 5 French catheter was left in place.  Through the second common femoral sheath a second angled catheter was advanced over a Bentson wire into the right heart in used to select the main pulmonary artery. Pulmonary arterial pressures were then obtained. The mean pulmonary artery pressure is 54/26 (mean 36) mm Hg. The catheter was advanced into the right main pulmonary artery and main pulmonary arteriogram was performed. There is significant thrombus throughout the right pulmonary arterial tree involving both the upper and lower lobes. Using a glidewire, the catheter was advanced into the distal right lower lobe.  The glidewire was exchanged for a Rosen wire.  Dual 12 cm EKOS multi side hole ultrasound assisted infusion catheters were selected and prepared. The catheters were advanced over the Rosen wires in position within the right and left main and lower lobe pulmonary arteries. An image was obtained documenting catheter position. Pulmonary arterial thrombolysis was then initiated at a rate of 1 milligram/hour per catheter for a total of 2 milligrams/hour. The catheters were secured and sterile bandages applied. The patient tolerated the procedure well.  COMPLICATIONS: None  Estimated blood loss:  0  IMPRESSION: 1. Main pulmonary arterial  mean pressure is 38 mmHg consistent with pulmonary arterial hypertension. 2. Initiation of bilateral ultrasound assisted pulmonary artery thrombolysis with EKOS.  Signed,  Criselda Peaches, MD  [facility]  [facility]   Electronically Signed   By: Jacqulynn Cadet M.D.   On: 10/19/2014 09:12   Ir Angiogram Selective Each Additional Vessel  10/19/2014   CLINICAL DATA:  49 year old female with sub massive bilateral pulmonary emboli and evidence of right heart strain both by imaging and bio markers (elevated troponins). She presents for bilateral catheter directed ultrasound accelerated thrombolysis with EKOS.  EXAM: BILATERAL PULMONARY ARTERIOGRAPHY; IR INFUSION THROMBOL ARTERIAL INITIAL (MS); IR ULTRASOUND GUIDANCE VASC ACCESS RIGHT; ADDITIONAL ARTERIOGRAPHY  Date: 10/19/2014  PROCEDURE: 1. Ultrasound-guided puncture of the right common femoral vein 2. Second ultrasound-guided puncture right common femoral vein 3. Catheterization left main pulmonary artery with pulmonary arteriogram 4. Catheterization of the main pulmonary artery with pressure measurement 5. Catheterization of the right pulmonary artery with pulmonary arteriogram 6. Placement of 12 cm infusion catheter in the left lower lobe pulmonary artery 7. Placement of 12 cm infusion  catheter in the right lower lobe pulmonary artery 8. Initiation of bilateral pulmonary arterial thrombolysis Interventional Radiologist:  Criselda Peaches, MD  ANESTHESIA/SEDATION: Moderate (conscious) sedation was used. 1 mg Versed, 50 mcg Fentanyl were administered intravenously. The patient's vital signs were monitored continuously by radiology nursing throughout the procedure.  Sedation Time: 20 minutes  MEDICATIONS: None additional  FLUOROSCOPY TIME:  6 minutes 48 seconds  423.9 mGy  CONTRAST:  65mL OMNIPAQUE IOHEXOL 300 MG/ML  SOLN  TECHNIQUE: Informed consent was obtained from the patient following explanation of the procedure, risks, benefits and alternatives. The patient understands, agrees and consents for the procedure. All questions were addressed. A time out was performed.  Maximal barrier sterile technique utilized including caps, mask, sterile gowns, sterile gloves, large sterile drape, hand hygiene, and Betadine skin prep.  The right groin was interrogated with ultrasound. The common femoral vein was easily identified. The vein is engorged but free of thrombus and compressible. Engorgement suggests elevated right heart pressures consistent with the clinical history. Local anesthesia was attained by infiltration with 1% lidocaine. Two small dermatotomy is were made.  Under real-time sonographic guidance, the common femoral vein was punctured with a 21 gauge micropuncture needle. With the assistance of a transitional micro sheath, the initial micro wire was exchanged for a 0.035 inch wire and a micro sheath exchanged for a working 6 [place] vascular sheath. Using the same technique, the vessel was punctured a second time with a separate 21 gauge micropuncture needle and a second 6 [place] working vascular sheath was introduced into the common femoral vein. Ultrasound images were documented for the medical record.  Through the first sheath, an angled catheter was advanced over a Bentson wire into the  right heart. The catheter was used to select the left main pulmonary artery. A left pulmonary arteriogram was performed with a hand injection. There is significant pruning of the pulmonary arterial vasculature is consistent with multifocal pulmonary emboli. Filling defects are present in the upper and lower lobar arteries using a glidewire, the distal lower lobe artery was catheterized. The glidewire was exchanged for a Rosen wire. The 5 French catheter was left in place.  Through the second common femoral sheath a second angled catheter was advanced over a Bentson wire into the right heart in used to select the main pulmonary artery. Pulmonary arterial pressures were then obtained. The mean  pulmonary artery pressure is 54/26 (mean 36) mm Hg. The catheter was advanced into the right main pulmonary artery and main pulmonary arteriogram was performed. There is significant thrombus throughout the right pulmonary arterial tree involving both the upper and lower lobes. Using a glidewire, the catheter was advanced into the distal right lower lobe. The glidewire was exchanged for a Rosen wire.  Dual 12 cm EKOS multi side hole ultrasound assisted infusion catheters were selected and prepared. The catheters were advanced over the Rosen wires in position within the right and left main and lower lobe pulmonary arteries. An image was obtained documenting catheter position. Pulmonary arterial thrombolysis was then initiated at a rate of 1 milligram/hour per catheter for a total of 2 milligrams/hour. The catheters were secured and sterile bandages applied. The patient tolerated the procedure well.  COMPLICATIONS: None  Estimated blood loss:  0  IMPRESSION: 1. Main pulmonary arterial mean pressure is 38 mmHg consistent with pulmonary arterial hypertension. 2. Initiation of bilateral ultrasound assisted pulmonary artery thrombolysis with EKOS.  Signed,  Criselda Peaches, MD  [facility]   [facility]   Electronically Signed   By: Jacqulynn Cadet M.D.   On: 10/19/2014 09:12   Ir Angiogram Selective Each Additional Vessel  10/19/2014   CLINICAL DATA:  49 year old female with sub massive bilateral pulmonary emboli and evidence of right heart strain both by imaging and bio markers (elevated troponins). She presents for bilateral catheter directed ultrasound accelerated thrombolysis with EKOS.  EXAM: BILATERAL PULMONARY ARTERIOGRAPHY; IR INFUSION THROMBOL ARTERIAL INITIAL (MS); IR ULTRASOUND GUIDANCE VASC ACCESS RIGHT; ADDITIONAL ARTERIOGRAPHY  Date: 10/19/2014  PROCEDURE: 1. Ultrasound-guided puncture of the right common femoral vein 2. Second ultrasound-guided puncture right common femoral vein 3. Catheterization left main pulmonary artery with pulmonary arteriogram 4. Catheterization of the main pulmonary artery with pressure measurement 5. Catheterization of the right pulmonary artery with pulmonary arteriogram 6. Placement of 12 cm infusion catheter in the left lower lobe pulmonary artery 7. Placement of 12 cm infusion catheter in the right lower lobe pulmonary artery 8. Initiation of bilateral pulmonary arterial thrombolysis Interventional Radiologist:  Criselda Peaches, MD  ANESTHESIA/SEDATION: Moderate (conscious) sedation was used. 1 mg Versed, 50 mcg Fentanyl were administered intravenously. The patient's vital signs were monitored continuously by radiology nursing throughout the procedure.  Sedation Time: 20 minutes  MEDICATIONS: None additional  FLUOROSCOPY TIME:  6 minutes 48 seconds  423.9 mGy  CONTRAST:  87mL OMNIPAQUE IOHEXOL 300 MG/ML  SOLN  TECHNIQUE: Informed consent was obtained from the patient following explanation of the procedure, risks, benefits and alternatives. The patient understands, agrees and consents for the procedure. All questions were addressed. A time out was performed.  Maximal barrier sterile technique utilized including caps, mask, sterile gowns,  sterile gloves, large sterile drape, hand hygiene, and Betadine skin prep.  The right groin was interrogated with ultrasound. The common femoral vein was easily identified. The vein is engorged but free of thrombus and compressible. Engorgement suggests elevated right heart pressures consistent with the clinical history. Local anesthesia was attained by infiltration with 1% lidocaine. Two small dermatotomy is were made.  Under real-time sonographic guidance, the common femoral vein was punctured with a 21 gauge micropuncture needle. With the assistance of a transitional micro sheath, the initial micro wire was exchanged for a 0.035 inch wire and a micro sheath exchanged for a working 6 [place] vascular sheath. Using the same technique, the vessel was punctured a second time with a  separate 21 gauge micropuncture needle and a second 60 [place] working vascular sheath was introduced into the common femoral vein. Ultrasound images were documented for the medical record.  Through the first sheath, an angled catheter was advanced over a Bentson wire into the right heart. The catheter was used to select the left main pulmonary artery. A left pulmonary arteriogram was performed with a hand injection. There is significant pruning of the pulmonary arterial vasculature is consistent with multifocal pulmonary emboli. Filling defects are present in the upper and lower lobar arteries using a glidewire, the distal lower lobe artery was catheterized. The glidewire was exchanged for a Rosen wire. The 5 French catheter was left in place.  Through the second common femoral sheath a second angled catheter was advanced over a Bentson wire into the right heart in used to select the main pulmonary artery. Pulmonary arterial pressures were then obtained. The mean pulmonary artery pressure is 54/26 (mean 36) mm Hg. The catheter was advanced into the right main pulmonary artery and main pulmonary arteriogram was performed. There is significant  thrombus throughout the right pulmonary arterial tree involving both the upper and lower lobes. Using a glidewire, the catheter was advanced into the distal right lower lobe. The glidewire was exchanged for a Rosen wire.  Dual 12 cm EKOS multi side hole ultrasound assisted infusion catheters were selected and prepared. The catheters were advanced over the Rosen wires in position within the right and left main and lower lobe pulmonary arteries. An image was obtained documenting catheter position. Pulmonary arterial thrombolysis was then initiated at a rate of 1 milligram/hour per catheter for a total of 2 milligrams/hour. The catheters were secured and sterile bandages applied. The patient tolerated the procedure well.  COMPLICATIONS: None  Estimated blood loss:  0  IMPRESSION: 1. Main pulmonary arterial mean pressure is 38 mmHg consistent with pulmonary arterial hypertension. 2. Initiation of bilateral ultrasound assisted pulmonary artery thrombolysis with EKOS.  Signed,  Criselda Peaches, MD  [facility]  [facility]   Electronically Signed   By: Jacqulynn Cadet M.D.   On: 10/19/2014 09:12   US Renal  10/20/2014   CLINICAL DATA:  Known pulmonary embolism and hypertension  EXAM: RENAL / URINARY TRACT ULTRASOUND COMPLETE  COMPARISON:  None.  FINDINGS: Right Kidney:  Length: 10.0 cm. Echogenicity within normal limits. No mass or hydronephrosis visualized. Normal vascular flow is noted.  Left Kidney:  Length: 13.3 cm. Echogenicity within normal limits. No mass or hydronephrosis visualized. Normal vascular flow is noted.  Bladder:  Appears normal for degree of bladder distention.  IMPRESSION: Normal-appearing kidneys. Normal vascularity is noted on this limited exam. This does not constitute a full renal Doppler examination.   Electronically Signed   By: Inez Catalina M.D.   On: 10/20/2014 21:09   Ir US Guide Vasc Access Right  10/19/2014   CLINICAL DATA:   49 year old female with sub massive bilateral pulmonary emboli and evidence of right heart strain both by imaging and bio markers (elevated troponins). She presents for bilateral catheter directed ultrasound accelerated thrombolysis with EKOS.  EXAM: BILATERAL PULMONARY ARTERIOGRAPHY; IR INFUSION THROMBOL ARTERIAL INITIAL (MS); IR ULTRASOUND GUIDANCE VASC ACCESS RIGHT; ADDITIONAL ARTERIOGRAPHY  Date: 10/19/2014  PROCEDURE: 1. Ultrasound-guided puncture of the right common femoral vein 2. Second ultrasound-guided puncture right common femoral vein 3. Catheterization left main pulmonary artery with pulmonary arteriogram 4. Catheterization of the main pulmonary artery with pressure measurement 5. Catheterization of the right pulmonary artery  with pulmonary arteriogram 6. Placement of 12 cm infusion catheter in the left lower lobe pulmonary artery 7. Placement of 12 cm infusion catheter in the right lower lobe pulmonary artery 8. Initiation of bilateral pulmonary arterial thrombolysis Interventional Radiologist:  Criselda Peaches, MD  ANESTHESIA/SEDATION: Moderate (conscious) sedation was used. 1 mg Versed, 50 mcg Fentanyl were administered intravenously. The patient's vital signs were monitored continuously by radiology nursing throughout the procedure.  Sedation Time: 20 minutes  MEDICATIONS: None additional  FLUOROSCOPY TIME:  6 minutes 48 seconds  423.9 mGy  CONTRAST:  64mL OMNIPAQUE IOHEXOL 300 MG/ML  SOLN  TECHNIQUE: Informed consent was obtained from the patient following explanation of the procedure, risks, benefits and alternatives. The patient understands, agrees and consents for the procedure. All questions were addressed. A time out was performed.  Maximal barrier sterile technique utilized including caps, mask, sterile gowns, sterile gloves, large sterile drape, hand hygiene, and Betadine skin prep.  The right groin was interrogated with ultrasound. The common femoral vein was easily identified. The vein  is engorged but free of thrombus and compressible. Engorgement suggests elevated right heart pressures consistent with the clinical history. Local anesthesia was attained by infiltration with 1% lidocaine. Two small dermatotomy is were made.  Under real-time sonographic guidance, the common femoral vein was punctured with a 21 gauge micropuncture needle. With the assistance of a transitional micro sheath, the initial micro wire was exchanged for a 0.035 inch wire and a micro sheath exchanged for a working 6 [place] vascular sheath. Using the same technique, the vessel was punctured a second time with a separate 21 gauge micropuncture needle and a second 6 [place] working vascular sheath was introduced into the common femoral vein. Ultrasound images were documented for the medical record.  Through the first sheath, an angled catheter was advanced over a Bentson wire into the right heart. The catheter was used to select the left main pulmonary artery. A left pulmonary arteriogram was performed with a hand injection. There is significant pruning of the pulmonary arterial vasculature is consistent with multifocal pulmonary emboli. Filling defects are present in the upper and lower lobar arteries using a glidewire, the distal lower lobe artery was catheterized. The glidewire was exchanged for a Rosen wire. The 5 French catheter was left in place.  Through the second common femoral sheath a second angled catheter was advanced over a Bentson wire into the right heart in used to select the main pulmonary artery. Pulmonary arterial pressures were then obtained. The mean pulmonary artery pressure is 54/26 (mean 36) mm Hg. The catheter was advanced into the right main pulmonary artery and main pulmonary arteriogram was performed. There is significant thrombus throughout the right pulmonary arterial tree involving both the upper and lower lobes. Using a glidewire, the catheter was advanced into the distal right lower lobe. The  glidewire was exchanged for a Rosen wire.  Dual 12 cm EKOS multi side hole ultrasound assisted infusion catheters were selected and prepared. The catheters were advanced over the Rosen wires in position within the right and left main and lower lobe pulmonary arteries. An image was obtained documenting catheter position. Pulmonary arterial thrombolysis was then initiated at a rate of 1 milligram/hour per catheter for a total of 2 milligrams/hour. The catheters were secured and sterile bandages applied. The patient tolerated the procedure well.  COMPLICATIONS: None  Estimated blood loss:  0  IMPRESSION: 1. Main pulmonary arterial mean pressure is 38 mmHg consistent with pulmonary arterial hypertension. 2. Initiation  of bilateral ultrasound assisted pulmonary artery thrombolysis with EKOS.  Signed,  Criselda Peaches, MD  [facility]  [facility]   Electronically Signed   By: Jacqulynn Cadet M.D.   On: 10/19/2014 09:12   Ir Infusion Thrombol Arterial Initial (ms)  10/19/2014   CLINICAL DATA:  49 year old female with sub massive bilateral pulmonary emboli and evidence of right heart strain both by imaging and bio markers (elevated troponins). She presents for bilateral catheter directed ultrasound accelerated thrombolysis with EKOS.  EXAM: BILATERAL PULMONARY ARTERIOGRAPHY; IR INFUSION THROMBOL ARTERIAL INITIAL (MS); IR ULTRASOUND GUIDANCE VASC ACCESS RIGHT; ADDITIONAL ARTERIOGRAPHY  Date: 10/19/2014  PROCEDURE: 1. Ultrasound-guided puncture of the right common femoral vein 2. Second ultrasound-guided puncture right common femoral vein 3. Catheterization left main pulmonary artery with pulmonary arteriogram 4. Catheterization of the main pulmonary artery with pressure measurement 5. Catheterization of the right pulmonary artery with pulmonary arteriogram 6. Placement of 12 cm infusion catheter in the left lower lobe pulmonary artery 7. Placement of 12 cm infusion  catheter in the right lower lobe pulmonary artery 8. Initiation of bilateral pulmonary arterial thrombolysis Interventional Radiologist:  Criselda Peaches, MD  ANESTHESIA/SEDATION: Moderate (conscious) sedation was used. 1 mg Versed, 50 mcg Fentanyl were administered intravenously. The patient's vital signs were monitored continuously by radiology nursing throughout the procedure.  Sedation Time: 20 minutes  MEDICATIONS: None additional  FLUOROSCOPY TIME:  6 minutes 48 seconds  423.9 mGy  CONTRAST:  58mL OMNIPAQUE IOHEXOL 300 MG/ML  SOLN  TECHNIQUE: Informed consent was obtained from the patient following explanation of the procedure, risks, benefits and alternatives. The patient understands, agrees and consents for the procedure. All questions were addressed. A time out was performed.  Maximal barrier sterile technique utilized including caps, mask, sterile gowns, sterile gloves, large sterile drape, hand hygiene, and Betadine skin prep.  The right groin was interrogated with ultrasound. The common femoral vein was easily identified. The vein is engorged but free of thrombus and compressible. Engorgement suggests elevated right heart pressures consistent with the clinical history. Local anesthesia was attained by infiltration with 1% lidocaine. Two small dermatotomy is were made.  Under real-time sonographic guidance, the common femoral vein was punctured with a 21 gauge micropuncture needle. With the assistance of a transitional micro sheath, the initial micro wire was exchanged for a 0.035 inch wire and a micro sheath exchanged for a working 6 [place] vascular sheath. Using the same technique, the vessel was punctured a second time with a separate 21 gauge micropuncture needle and a second 6 [place] working vascular sheath was introduced into the common femoral vein. Ultrasound images were documented for the medical record.  Through the first sheath, an angled catheter was advanced over a Bentson wire into the  right heart. The catheter was used to select the left main pulmonary artery. A left pulmonary arteriogram was performed with a hand injection. There is significant pruning of the pulmonary arterial vasculature is consistent with multifocal pulmonary emboli. Filling defects are present in the upper and lower lobar arteries using a glidewire, the distal lower lobe artery was catheterized. The glidewire was exchanged for a Rosen wire. The 5 French catheter was left in place.  Through the second common femoral sheath a second angled catheter was advanced over a Bentson wire into the right heart in used to select the main pulmonary artery. Pulmonary arterial pressures were then obtained. The mean pulmonary artery pressure is 54/26 (mean 36) mm Hg. The catheter was  advanced into the right main pulmonary artery and main pulmonary arteriogram was performed. There is significant thrombus throughout the right pulmonary arterial tree involving both the upper and lower lobes. Using a glidewire, the catheter was advanced into the distal right lower lobe. The glidewire was exchanged for a Rosen wire.  Dual 12 cm EKOS multi side hole ultrasound assisted infusion catheters were selected and prepared. The catheters were advanced over the Rosen wires in position within the right and left main and lower lobe pulmonary arteries. An image was obtained documenting catheter position. Pulmonary arterial thrombolysis was then initiated at a rate of 1 milligram/hour per catheter for a total of 2 milligrams/hour. The catheters were secured and sterile bandages applied. The patient tolerated the procedure well.  COMPLICATIONS: None  Estimated blood loss:  0  IMPRESSION: 1. Main pulmonary arterial mean pressure is 38 mmHg consistent with pulmonary arterial hypertension. 2. Initiation of bilateral ultrasound assisted pulmonary artery thrombolysis with EKOS.  Signed,  Criselda Peaches, MD  [facility]   [facility]   Electronically Signed   By: Jacqulynn Cadet M.D.   On: 10/19/2014 09:12   Ir Infusion Thrombol Arterial Initial (ms)  10/19/2014   CLINICAL DATA:  49 year old female with sub massive bilateral pulmonary emboli and evidence of right heart strain both by imaging and bio markers (elevated troponins). She presents for bilateral catheter directed ultrasound accelerated thrombolysis with EKOS.  EXAM: BILATERAL PULMONARY ARTERIOGRAPHY; IR INFUSION THROMBOL ARTERIAL INITIAL (MS); IR ULTRASOUND GUIDANCE VASC ACCESS RIGHT; ADDITIONAL ARTERIOGRAPHY  Date: 10/19/2014  PROCEDURE: 1. Ultrasound-guided puncture of the right common femoral vein 2. Second ultrasound-guided puncture right common femoral vein 3. Catheterization left main pulmonary artery with pulmonary arteriogram 4. Catheterization of the main pulmonary artery with pressure measurement 5. Catheterization of the right pulmonary artery with pulmonary arteriogram 6. Placement of 12 cm infusion catheter in the left lower lobe pulmonary artery 7. Placement of 12 cm infusion catheter in the right lower lobe pulmonary artery 8. Initiation of bilateral pulmonary arterial thrombolysis Interventional Radiologist:  Criselda Peaches, MD  ANESTHESIA/SEDATION: Moderate (conscious) sedation was used. 1 mg Versed, 50 mcg Fentanyl were administered intravenously. The patient's vital signs were monitored continuously by radiology nursing throughout the procedure.  Sedation Time: 20 minutes  MEDICATIONS: None additional  FLUOROSCOPY TIME:  6 minutes 48 seconds  423.9 mGy  CONTRAST:  58mL OMNIPAQUE IOHEXOL 300 MG/ML  SOLN  TECHNIQUE: Informed consent was obtained from the patient following explanation of the procedure, risks, benefits and alternatives. The patient understands, agrees and consents for the procedure. All questions were addressed. A time out was performed.  Maximal barrier sterile technique utilized including caps, mask, sterile gowns, sterile  gloves, large sterile drape, hand hygiene, and Betadine skin prep.  The right groin was interrogated with ultrasound. The common femoral vein was easily identified. The vein is engorged but free of thrombus and compressible. Engorgement suggests elevated right heart pressures consistent with the clinical history. Local anesthesia was attained by infiltration with 1% lidocaine. Two small dermatotomy is were made.  Under real-time sonographic guidance, the common femoral vein was punctured with a 21 gauge micropuncture needle. With the assistance of a transitional micro sheath, the initial micro wire was exchanged for a 0.035 inch wire and a micro sheath exchanged for a working 6 [place] vascular sheath. Using the same technique, the vessel was punctured a second time with a separate 21 gauge micropuncture needle and a second 6 [place] working vascular  sheath was introduced into the common femoral vein. Ultrasound images were documented for the medical record.  Through the first sheath, an angled catheter was advanced over a Bentson wire into the right heart. The catheter was used to select the left main pulmonary artery. A left pulmonary arteriogram was performed with a hand injection. There is significant pruning of the pulmonary arterial vasculature is consistent with multifocal pulmonary emboli. Filling defects are present in the upper and lower lobar arteries using a glidewire, the distal lower lobe artery was catheterized. The glidewire was exchanged for a Rosen wire. The 5 French catheter was left in place.  Through the second common femoral sheath a second angled catheter was advanced over a Bentson wire into the right heart in used to select the main pulmonary artery. Pulmonary arterial pressures were then obtained. The mean pulmonary artery pressure is 54/26 (mean 36) mm Hg. The catheter was advanced into the right main pulmonary artery and main pulmonary arteriogram was performed. There is significant  thrombus throughout the right pulmonary arterial tree involving both the upper and lower lobes. Using a glidewire, the catheter was advanced into the distal right lower lobe. The glidewire was exchanged for a Rosen wire.  Dual 12 cm EKOS multi side hole ultrasound assisted infusion catheters were selected and prepared. The catheters were advanced over the Rosen wires in position within the right and left main and lower lobe pulmonary arteries. An image was obtained documenting catheter position. Pulmonary arterial thrombolysis was then initiated at a rate of 1 milligram/hour per catheter for a total of 2 milligrams/hour. The catheters were secured and sterile bandages applied. The patient tolerated the procedure well.  COMPLICATIONS: None  Estimated blood loss:  0  IMPRESSION: 1. Main pulmonary arterial mean pressure is 38 mmHg consistent with pulmonary arterial hypertension. 2. Initiation of bilateral ultrasound assisted pulmonary artery thrombolysis with EKOS.  Signed,  Criselda Peaches, MD  [facility]  [facility]   Electronically Signed   By: Jacqulynn Cadet M.D.   On: 10/19/2014 09:12   Ir Jacolyn Reedy F/u Eval Art/ven Final Day (ms)  10/19/2014   INDICATION: History of sub massive pulmonary embolism, post initiation of bilateral ultrasound assisted pulmonary arterial thrombolysis.  Patient returns to the Interventional Radiology suite for completion pulmonary arterial pressure measurements.  EXAM: IR THROMB F/U EVAL ART/VEN FINAL DAY  COMPARISON:  Initiation of bilateral ultrasound assisted pulmonary arterial thrombolysis - 10/18/2014; chest CTA -10/18/2014  MEDICATIONS: None.  CONTRAST:  None  FLUOROSCOPY TIME:  6 seconds.  COMPLICATIONS: None immediate  TECHNIQUE: The patient was positioned supine on the fluoroscopy table. A preprocedural spot fluoroscopic image was obtained of the chest.  The infusion wire of right pulmonary arterial infusion catheter was  removed and the catheter was retracted to the level of the main pulmonary artery. Pulmonary arterial pressure measurements were obtained through the gland porch of the infusion catheter. This procedure was repeated for the contralateral left pulmonary arterial infusion catheter.  Pressure measurement results were discussed with referring physician, Dr. Nelda Marseille, and the decision was made to terminate the procedure.  As such, all wires catheters and sheaths were removed from the patient. Hemostasis was achieved at the right groin access site with manual compression. A dressing was placed. The patient tolerated the procedure well without immediate postprocedural complication.  FINDINGS: Fluoroscopic imaging demonstrates unchanged positioning of the bilateral pulmonary arterial ultrasound assisted infusion catheters with distal radiopaque tips overlying expected location of bilateral subsegmental pulmonary arteries.  Main pulmonary arterial pressure measurements at the time of pulmonary arterial initiation - 54/26 mm Hg (mean - 36).  Postprocedural main pulmonary arterial measurements obtained via the right pulmonary arterial infusion catheter: 54/28 mm Hg (mean - 37)  Postprocedural main pulmonary arterial measurements obtained via the left pulmonary arterial infusion catheter: 51/27 mm Hg (mean - 36)  IMPRESSION: No significant reduction in main pulmonary arterial pressure following 12 hours of ultrasound assisted pulmonary arterial thrombolysis. This was discussed with referring physician, Dr. Nelda Marseille, and the decision was made to terminate the procedure, as it is believed the patient has chronic pre-existing pulmonary arterial hypertension and sleep apnea and as such, continued thrombolysis is not indicated.  Above discussed with Dr. Nelda Marseille at the time of procedure completion.   Electronically Signed   By: Sandi Mariscal M.D.   On: 10/19/2014 13:10    Microbiology: Recent Results (from the past 240 hour(s))    Culture, blood (routine x 2)     Status: None (Preliminary result)   Collection Time: 10/18/14  5:15 PM  Result Value Ref Range Status   Specimen Description BLOOD RIGHT ASSIST CONTROL  Final   Special Requests BOTTLES DRAWN AEROBIC AND ANAEROBIC  1CC  Final   Culture NO GROWTH 4 DAYS  Final   Report Status PENDING  Incomplete  Culture, blood (routine x 2)     Status: None (Preliminary result)   Collection Time: 10/18/14  5:30 PM  Result Value Ref Range Status   Specimen Description BLOOD LEFT ASSIST CONTROL  Final   Special Requests   Final    BOTTLES DRAWN AEROBIC AND ANAEROBIC  AER 4CC ANA 1CC   Culture NO GROWTH 4 DAYS  Final   Report Status PENDING  Incomplete  MRSA PCR Screening     Status: None   Collection Time: 10/18/14  8:15 PM  Result Value Ref Range Status   MRSA by PCR NEGATIVE NEGATIVE Final    Comment:        The GeneXpert MRSA Assay (FDA approved for NASAL specimens only), is one component of a comprehensive MRSA colonization surveillance program. It is not intended to diagnose MRSA infection nor to guide or monitor treatment for MRSA infections.      Labs: Basic Metabolic Panel:  Recent Labs Lab 10/19/14 0637 10/19/14 1545 10/20/14 0452 10/22/14 0818 10/23/14 0653  NA 136 138 143 141 141  K 5.5* 4.4 4.5 4.3 4.1  CL 103 105 108 106 103  CO2 24 26 28 30 30   GLUCOSE 103* 119* 106* 101* 102*  BUN 32* 31* 22* 15 17  CREATININE 2.32* 1.88* 1.36* 0.85 0.86  CALCIUM 8.0* 8.3* 8.4* 8.8* 8.7*  MG  --   --  1.9 1.6*  --   PHOS  --   --  4.5 4.3  --    Liver Function Tests:  Recent Labs Lab 10/18/14 1503 10/18/14 2146  AST 66* 305*  ALT 54 240*  ALKPHOS 88 76  BILITOT 0.9 0.6  PROT 6.8 6.5  ALBUMIN 3.2* 2.7*   No results for input(s): LIPASE, AMYLASE in the last 168 hours. No results for input(s): AMMONIA in the last 168 hours. CBC:  Recent Labs Lab 10/18/14 1503  10/19/14 1645 10/19/14 2311 10/20/14 0452 10/22/14 0818 10/23/14 0653   WBC 17.7*  < > 9.1 9.1 9.4 8.9 8.8  NEUTROABS 14.9*  --   --   --   --   --   --   HGB 11.4*  < >  9.8* 10.3* 9.8* 10.1* 10.4*  HCT 37.1  < > 31.6* 33.2* 33.0* 33.0* 34.0*  MCV 86.9  < > 88.3 88.5 89.9 88.7 88.5  PLT 199  < > 148* 159 158 163 183  < > = values in this interval not displayed. Cardiac Enzymes:  Recent Labs Lab 10/18/14 1503 10/18/14 1715 10/18/14 2146 10/19/14 0318 10/19/14 1128  TROPONINI 0.43* 0.68* 1.00* 1.00* 0.64*   BNP: BNP (last 3 results)  Recent Labs  10/18/14 1503  BNP 173.0*    ProBNP (last 3 results) No results for input(s): PROBNP in the last 8760 hours.  CBG:  Recent Labs Lab 10/22/14 2045 10/23/14 0026 10/23/14 0418 10/23/14 0755 10/23/14 1147  GLUCAP 163* 121* 97 83 146*       Signed:  Wava Kildow  [facility] 10/23/2014, 1:13 PM

## 2014-10-23 NOTE — Progress Notes (Signed)
SATURATION QUALIFICATIONS: (This note is used to comply with regulatory documentation for home oxygen)  Patient Saturations on Room Air at Rest = 84%    Patient Saturations on 6 Liters of oxygen while Ambulating = 94%  Please briefly explain why patient needs home oxygen:  Patient at rest with O2 dropped down to 84 percent without activity. Patient was on 3 litters while walking and sats where 82% Oxygen turned up to 6 litter and sats increased to 90's

## 2014-10-23 NOTE — Care Management Note (Signed)
Case Management Note  Patient Details  Name: Nicole Schmidt MRN: [id number] Date of Birth: June 10, 1965  Subjective/Objective:                 Patient from home lives alone. Plan for discharge today to home with oxygen through [facility] and [facility] RN and PT through [facility]. Patient states she has walker at home and plans to transition to her mother's old house that is handicap accessible. Her new address will be [address] [place]. She has a referral made to the sleep disorder clinic at [facility], faxed 10-23-14. Patient denies any other assistance at this time. Referral made to [facility] to follow PE. Varalto free card and year Rx coupon card both given.   Action/Plan:   Expected Discharge Date:                  Expected Discharge Plan:  Home/Self Care  In-House Referral:     Discharge planning Services  CM Consult, Medication Assistance  Post Acute Care Choice:  Durable Medical Equipment, Home Health Choice offered to:  Patient  DME Arranged:  Oxygen DME Agency:  [place]:  RN, PT [facility] Agency:     Status of Service:  Completed, signed off  Medicare Important Message Given:    Date Medicare IM Given:    Medicare IM give by:    Date Additional Medicare IM Given:    Additional Medicare Important Message give by:     If discussed at [place] of Stay Meetings, dates discussed:    Additional Comments:  Carles Collet, RN 10/23/2014, 11:43 AM

## 2014-10-23 NOTE — Progress Notes (Signed)
Physical Therapy Treatment Patient Details Name: Nicole Schmidt MRN: [id number] DOB: Jan 08, 1966 Today's Date: 10/23/2014    History of Present Illness [name] is a 49 y/o woman with past medical history of breast cancer treated with lumpectomy (right sided), lymph node dissection and radiation therapy 2 years ago. She has been on tamoxifen since then. She also has HTN, DM2 and hypothyroidism. She has had a slight cough and mild shortness of breath for about a week. Immediately prior to going to the emergency room she developed severe shortness of breath and lightheadedness. She felt like she might pass out every time she tried to move although she never syncopized. She underwent CTA in the ER which showed large bilateral PEs with evidence of right heart strain. She was admitted and transferred to [facility] for possible catheter directed lytic therapy which was not successful.    PT Comments    Pt doing very well with mobility. Endurance and activity tolerance are areas pt needs to concentrate on. Instructed pt that she will have to pace herself at home.  Follow Up Recommendations  Home health PT     Equipment Recommendations  None recommended by PT    Recommendations for Other Services       Precautions / Restrictions Precautions Precautions: None Restrictions Weight Bearing Restrictions: No    Mobility  Bed Mobility                  Transfers Overall transfer level: Modified independent Equipment used: 4-wheeled walker Transfers: Sit to/from Stand Sit to Stand: Modified independent (Device/Increase time)            Ambulation/Gait Ambulation/Gait assistance: Modified independent (Device/Increase time) Ambulation Distance (Feet): 170 Feet Assistive device: 4-wheeled walker Gait Pattern/deviations: Step-through pattern;Decreased stride length   Gait velocity interpretation: Below normal speed for age/gender General Gait Details: SaO2 82% amb on 3L. SaO2 >95%  on 6L.   Stairs            Wheelchair Mobility    Modified Rankin (Stroke Patients Only)       Balance     Sitting balance-Leahy Scale: Good       Standing balance-Leahy Scale: Good                      Cognition Arousal/Alertness: Awake/alert Behavior During Therapy: WFL for tasks assessed/performed Overall Cognitive Status: Within Functional Limits for tasks assessed                      Exercises      General Comments        Pertinent Vitals/Pain Pain Assessment: No/denies pain    Home Living                      Prior Function            PT Goals (current goals can now be found in the care plan section) Acute Rehab PT Goals Patient Stated Goal: return to home and work PT Goal Formulation: With patient Time For Goal Achievement: 10/30/14 Potential to Achieve Goals: Good Progress towards PT goals: Goals met and updated - see care plan    Frequency  Min 3X/week    PT Plan Current plan remains appropriate    Co-evaluation             End of Session Equipment Utilized During Treatment: Oxygen Activity Tolerance: Patient tolerated treatment well Patient left: in chair;with call  bell/phone within reach     Time: 1050-1107 PT Time Calculation (min) (ACUTE ONLY): 17 min  Charges:  $Gait Training: 8-22 mins                    G Codes:      Jordie Skalsky 11-18-2014, 12:29 PM  [employer] PT [phone]

## 2014-12-01 ENCOUNTER — Institutional Professional Consult (permissible substitution): Payer: BLUE CROSS/BLUE SHIELD | Admitting: Internal Medicine

## 2015-01-12 ENCOUNTER — Ambulatory Visit: Payer: BLUE CROSS/BLUE SHIELD | Admitting: Hematology and Oncology

## 2015-01-12 ENCOUNTER — Other Ambulatory Visit: Payer: BLUE CROSS/BLUE SHIELD

## 2015-01-15 ENCOUNTER — Other Ambulatory Visit: Payer: Self-pay

## 2015-01-15 DIAGNOSIS — Z853 Personal history of malignant neoplasm of breast: Secondary | ICD-10-CM

## 2015-01-17 ENCOUNTER — Other Ambulatory Visit: Payer: Self-pay | Admitting: Hematology and Oncology

## 2015-01-17 ENCOUNTER — Encounter: Payer: Self-pay | Admitting: Hematology and Oncology

## 2015-01-17 DIAGNOSIS — C50911 Malignant neoplasm of unspecified site of right female breast: Secondary | ICD-10-CM

## 2015-01-17 DIAGNOSIS — I2699 Other pulmonary embolism without acute cor pulmonale: Secondary | ICD-10-CM

## 2015-01-17 NOTE — Progress Notes (Signed)
[facility] day:  07/06/2014  Chief Complaint: Nicole Schmidt is a 49 y.o. female with stage I breast cancer who is seen for 4 month assessment on tamoxifen.  HPI: The patient was last seen in the medical oncology clinic on 03/09/2014.  At that time, she felt well.  She had no menses since 07/2012.  Exam was unremarkable. CBC included a hematocrit 35.4, hemoglobin 11.1, and MCV 90.  Conference of metabolic panel was normal except for creatinine of 1.5.  We discussed a yearly pelvic exam and eye exam.  She was to continue her tamoxifen.  Bilateral mammogram on 09/02/2013 revealed no suspicious abnormality. Diagnostic right sided mammogram on 04/03/2014 revealed no suspicious findings. She was status post lumpectomy on the right with expected post lumpectomy density and architectural distortion. There were scattered benign calcifications.  During the interim she describes "hanging in there". She feels okay. She denies any breast concerns. She describes being up all night with her father in the emergency room.  Past Medical History  Diagnosis Date  . Arthritis   . Menopause     age 41  . Hypertension   . Gout   . Thyroid goiter   . 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 Er/PR pos, her 2 negative  . Diabetes mellitus without complication   . Renal insufficiency   . Anemia   . Gout     Past Surgical History  Procedure Laterality Date  . Breast surgery Right 2014    with sentinel node bx subareaolar duct excision  . Breast biopsy Right 2014    Family History  Problem Relation Age of Onset  . Diabetes Father   . Hypertension Father   . Parkinson's disease Father   . Hypertension Mother   . Rheum arthritis Mother   . Atrial fibrillation Mother   . Heart failure Mother     Social History:  reports that she has never smoked. She has never used smokeless tobacco. She  reports that she does not drink alcohol or use illicit drugs.  Her mother died of a MI.  Her father is in a nursing home.  She visits him daily.  He has Parkinson's disease and dementia.  She is an only child.  She has never married.  The patient is alone today.  Allergies: No Known Allergies  Current Medications: Current Outpatient Prescriptions  Medication Sig Dispense Refill  . ARMOUR THYROID 60 MG tablet Take 2 tablets by mouth daily.    . Calcium Carbonate-Vitamin D (CALCIUM 600 + D PO) Take 1 tablet by mouth daily.    . ergocalciferol (VITAMIN D2) 50000 UNITS capsule Take 50,000 Units by mouth once a week. Take on Fridays.    . folic acid (FOLVITE) 397 MCG tablet Take 800 mcg by mouth daily.     Marland Kitchen lisinopril-hydrochlorothiazide (PRINZIDE,ZESTORETIC) 20-25 MG per tablet Take 1 tablet by mouth daily.    . metFORMIN (GLUCOPHAGE) 1000 MG tablet Take 1 tablet by mouth 2 (two) times daily.    Marland Kitchen CINNAMON PO Take 2,000 mg by mouth daily.    . ferrous sulfate 325 (65 FE) MG tablet Take 325 mg by mouth daily.    Marland Kitchen nystatin (MYCOSTATIN/NYSTOP) 100000 UNIT/GM POWD Apply topically to rash 3 times a day 30 g 1  . Rivaroxaban (XARELTO) 15 MG TABS tablet Take 1 tablet (15 mg total) by mouth  2 (two) times daily with a meal. 36 tablet 0  . rivaroxaban (XARELTO) 20 MG TABS tablet Take 1 tablet (20 mg total) by mouth daily with supper. 30 tablet 0   No current facility-administered medications for this visit.    Review of Systems:  GENERAL:  Feels "ok".  No fevers, sweats or weight loss. PERFORMANCE STATUS (ECOG):  0 HEENT:  No visual changes, runny nose, sore throat, mouth sores or tenderness. Lungs: No shortness of breath or cough.  No hemoptysis. Cardiac:  No chest pain, palpitations, orthopnea, or PND. GI:  No nausea, vomiting, diarrhea, constipation, melena or hematochezia. GU:  No urgency, frequency, dysuria, or hematuria. Musculoskeletal:  No back pain.  No joint pain.  No muscle  tenderness. Extremities:  No pain or swelling. Skin:  No rashes or skin changes. Neuro:  No headache, numbness or weakness, balance or coordination issues. Endocrine:  No diabetes, thyroid issues, hot flashes or night sweats. Psych:  No mood changes, depression or anxiety. Pain:  No focal pain. Review of systems:  All other systems reviewed and found to be negative.  Physical Exam: Blood pressure 183/69, pulse 84, temperature 98 F (36.7 C), temperature source Tympanic, height _0  (1.6 m), weight 337 lb 11.9 oz (153.2 kg), last menstrual period 07/17/2012. GENERAL:  Well developed, well nourished, woman sitting comfortably in the exam room in no acute distress. MENTAL STATUS:  Alert and oriented to person, place and time. HEAD:  Shoulder length brown hair.  Normocephalic, atraumatic, face symmetric, no Cushingoid features. EYES:  Blue eyes.  Pupils equal round and reactive to light and accomodation.  No conjunctivitis or scleral icterus. ENT:  Oropharynx clear without lesion.  Tongue normal. Mucous membranes moist.  RESPIRATORY:  Clear to auscultation without rales, wheezes or rhonchi. CARDIOVASCULAR:  Regular rate and rhythm without murmur, rub or gallop. BREASTS:  Right breast with post operative and post radiation changes.  No discrete masses, skin changes or nipple discharge.  Left breast without masses, skin changes or nipple discharge. ABDOMEN:  Soft, non-tender, with active bowel sounds, and no hepatosplenomegaly.  No masses. SKIN:  No rashes, ulcers or lesions. EXTREMITIES: No edema, no skin discoloration or tenderness.  No palpable cords. LYMPH NODES: No palpable cervical, supraclavicular, axillary or inguinal adenopathy  NEUROLOGICAL: Unremarkable. PSYCH:  Appropriate.  No visits with results within 3 Day(s) from this visit. Latest known visit with results is:  Abstract on 06/03/2014  Component Date Value Ref Range Status  . Creatine, Serum 03/09/2014 1.50   Final     Assessment:  Nicole Schmidt is a 49 y.o. female with a history of stage I right breast cancer status post wide local incision on 08/12/2012.  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  Sentinel lymph node was negative.  Tumor was ER/PR positive and Her2/neu negative.  Oncotype DX score was low.  She completed breast radiation in 10/2012.  She has been on tamoxifen since 10/2012.  Bilateral mammogram on 09/02/2013 revealed no suspicious findings.  Right sided mammogram on 04/03/2014 revealed no suspicious findings.  The patient had a bone density study 2 years ago (unknown results).  Symptomatically, she denies any breast concerns.  She has had no menses since 07/2012.  Exam is unremarkable.  Plan: 1. Labs today:  CBC with diff, CMP, CA27.29 2. Continue tamoxifen. 3. Encourage follow-up pelvic exam and eye exam. 4. Review mammogram from 03/2014. 5. RTC in 6 months for MD assess and labs (CBC with diff,  CMP, CA27.29).   Lequita Asal, MD  07/06/2014

## 2015-01-18 ENCOUNTER — Inpatient Hospital Stay: Payer: BLUE CROSS/BLUE SHIELD | Attending: Hematology and Oncology

## 2015-01-18 ENCOUNTER — Inpatient Hospital Stay (HOSPITAL_BASED_OUTPATIENT_CLINIC_OR_DEPARTMENT_OTHER): Payer: BLUE CROSS/BLUE SHIELD | Admitting: Hematology and Oncology

## 2015-01-18 VITALS — BP 176/98 | HR 97 | Temp 99.4°F | Resp 18 | Ht 64.0 in | Wt 347.7 lb

## 2015-01-18 DIAGNOSIS — Z170 Estrogen receptor positive status [ER+]: Secondary | ICD-10-CM | POA: Diagnosis not present

## 2015-01-18 DIAGNOSIS — M109 Gout, unspecified: Secondary | ICD-10-CM | POA: Insufficient documentation

## 2015-01-18 DIAGNOSIS — I10 Essential (primary) hypertension: Secondary | ICD-10-CM | POA: Insufficient documentation

## 2015-01-18 DIAGNOSIS — Z923 Personal history of irradiation: Secondary | ICD-10-CM | POA: Insufficient documentation

## 2015-01-18 DIAGNOSIS — I2699 Other pulmonary embolism without acute cor pulmonale: Secondary | ICD-10-CM

## 2015-01-18 DIAGNOSIS — Z780 Asymptomatic menopausal state: Secondary | ICD-10-CM | POA: Diagnosis not present

## 2015-01-18 DIAGNOSIS — Z79899 Other long term (current) drug therapy: Secondary | ICD-10-CM | POA: Diagnosis not present

## 2015-01-18 DIAGNOSIS — C50911 Malignant neoplasm of unspecified site of right female breast: Secondary | ICD-10-CM

## 2015-01-18 DIAGNOSIS — E119 Type 2 diabetes mellitus without complications: Secondary | ICD-10-CM | POA: Insufficient documentation

## 2015-01-18 DIAGNOSIS — I2609 Other pulmonary embolism with acute cor pulmonale: Secondary | ICD-10-CM

## 2015-01-18 LAB — COMPREHENSIVE METABOLIC PANEL
ALT: 18 U/L (ref 14–54)
AST: 20 U/L (ref 15–41)
Albumin: 3.5 g/dL (ref 3.5–5.0)
Alkaline Phosphatase: 137 U/L — ABNORMAL HIGH (ref 38–126)
Anion gap: 8 (ref 5–15)
BUN: 36 mg/dL — ABNORMAL HIGH (ref 6–20)
CO2: 28 mmol/L (ref 22–32)
Calcium: 8.8 mg/dL — ABNORMAL LOW (ref 8.9–10.3)
Chloride: 102 mmol/L (ref 101–111)
Creatinine, Ser: 0.93 mg/dL (ref 0.44–1.00)
GFR calc Af Amer: >60 mL/min (ref >60)
GFR calc non Af Amer: >60 mL/min (ref >60)
Glucose, Bld: 141 mg/dL — ABNORMAL HIGH (ref 65–99)
Potassium: 3.8 mmol/L (ref 3.5–5.1)
Sodium: 138 mmol/L (ref 135–145)
Total Bilirubin: 0.3 mg/dL (ref 0.3–1.2)
Total Protein: 7.2 g/dL (ref 6.5–8.1)

## 2015-01-18 LAB — CBC WITH DIFFERENTIAL/PLATELET
Basophils Absolute: 0 10*3/uL (ref 0–0.1)
Basophils Relative: 0 %
Eosinophils Absolute: 0.4 10*3/uL (ref 0–0.7)
Eosinophils Relative: 4 %
HCT: 36.1 % (ref 35.0–47.0)
Hemoglobin: 11.5 g/dL — ABNORMAL LOW (ref 12.0–16.0)
Lymphocytes Relative: 13 %
Lymphs Abs: 1.4 10*3/uL (ref 1.0–3.6)
MCH: 27.4 pg (ref 26.0–34.0)
MCHC: 31.8 g/dL — ABNORMAL LOW (ref 32.0–36.0)
MCV: 86.2 fL (ref 80.0–100.0)
Monocytes Absolute: 0.8 10*3/uL (ref 0.2–0.9)
Monocytes Relative: 7 %
Neutro Abs: 8 10*3/uL — ABNORMAL HIGH (ref 1.4–6.5)
Neutrophils Relative %: 76 %
Platelets: 235 10*3/uL (ref 150–440)
RBC: 4.19 MIL/uL (ref 3.80–5.20)
RDW: 16.7 % — ABNORMAL HIGH (ref 11.5–14.5)
WBC: 10.6 10*3/uL (ref 3.6–11.0)

## 2015-01-18 NOTE — Progress Notes (Signed)
Patient is here for follow-up of breast cancer. Patient states that she had two PE's in September. She was started on xarelto and is currently on 20mg  daily. Patient denies any pain. Last mammogram was in August 2016 and was normal. Patient was taken off the tamoxifen at the time of the PE's and has not been on any AI's since then.

## 2015-01-18 NOTE — Progress Notes (Signed)
[facility] day:  01/18/2015   Chief Complaint: Nicole Schmidt is a 49 y.o. female with stage I breast cancer who is seen for 6 month assessment on tamoxifen.  HPI: The patient was last seen in the medical oncology clinic on 07/06/2014.  At that time, she was seen for 4 month assessment on tamoxifen.  She denied any breast concerns. Exam was unremarkable.  During the interim, she was admitted on 10/18/2014 with bilateral submassive pulmonary emboli.  She presented with a week history of progressive shortness of breath and presyncope.  Chest CT angiogram revealed submassive PE with right heart strain.  She was admitted and transferred to [facility] for catheter directed lytic therapy.  She was then placed on heparin and transitioned to Xarelto.  Lower extremity duplex was negative for DVT.  She underwent a hypercoagulable work-up.  Normal studies included the following:  Factor V Leiden, prothrombin gene mutation, lupus anticoagulant panel, anticardiolipin antibodies, protein S total (127%), protein S activity (99%), protein C total (71%), protein C activity (77%), and ATIII activity (82%).  Symptomatically, she denies any breast concerns.  She is off tamoxifen.  She denies any shortness of breath.  Past Medical History  Diagnosis Date  . Arthritis   . Menopause     age 81  . Hypertension   . Gout   . Thyroid goiter   . Breast cancer of upper-inner quadrant of right female breast ([place])     Right breast invasive CA and DCIS , T1,N0,M0. Er/PR pos, her 2 negative  . Diabetes mellitus without complication ([place])   . Renal insufficiency   . Anemia   . Gout     Past Surgical History  Procedure Laterality Date  . Breast surgery Right 2014    with sentinel node bx subareaolar duct excision  . Breast biopsy Right 2014    Family History  Problem Relation Age of Onset  . Diabetes Father   . Hypertension Father   . Parkinson's disease Father    . Hypertension Mother   . Rheum arthritis Mother   . Atrial fibrillation Mother   . Heart failure Mother     Social History:  reports that she has never smoked. She has never used smokeless tobacco. She reports that she does not drink alcohol or use illicit drugs.  Her mother died of a MI.  Her father is in a nursing home.  She visits him daily.  He has Parkinson's disease and dementia.  She is an only child.  She has never married.  The patient is alone today.  Allergies: No Known Allergies  Current Medications: Current Outpatient Prescriptions  Medication Sig Dispense Refill  . allopurinol (ZYLOPRIM) 300 MG tablet     . ARMOUR THYROID 60 MG tablet Take 2 tablets by mouth daily.    . Calcium Carbonate-Vitamin D (CALCIUM 600 + D PO) Take 1 tablet by mouth daily.    Marland Kitchen CINNAMON PO Take 2,000 mg by mouth daily.    . ergocalciferol (VITAMIN D2) 50000 UNITS capsule Take 50,000 Units by mouth once a week. Take on Fridays.    . ferrous sulfate 325 (65 FE) MG tablet Take 325 mg by mouth daily.    . folic acid (FOLVITE) 325 MCG tablet Take 800 mcg by mouth daily.     . furosemide (LASIX) 20 MG tablet Take 20 mg by mouth.    Marland Kitchen lisinopril-hydrochlorothiazide (PRINZIDE,ZESTORETIC) 20-25 MG per tablet Take 1 tablet  by mouth daily.    . metFORMIN (GLUCOPHAGE) 1000 MG tablet Take 1 tablet by mouth 2 (two) times daily.    Marland Kitchen nystatin (MYCOSTATIN/NYSTOP) 100000 UNIT/GM POWD Apply topically to rash 3 times a day 30 g 1  . rivaroxaban (XARELTO) 20 MG TABS tablet Take 1 tablet (20 mg total) by mouth daily with supper. 30 tablet 0   No current facility-administered medications for this visit.    Review of Systems:  GENERAL:  Feels fine.  No fevers, sweats or weight loss. PERFORMANCE STATUS (ECOG):  0 HEENT:  No visual changes, runny nose, sore throat, mouth sores or tenderness. Lungs: No increased shortness of breath.  No cough.  No hemoptysis. Cardiac:  No chest pain, palpitations, orthopnea, or  PND. GI:  No nausea, vomiting, diarrhea, constipation, melena or hematochezia. GU:  No urgency, frequency, dysuria, or hematuria. Musculoskeletal:  No back pain.  No joint pain.  No muscle tenderness. Extremities:  No pain or swelling. Skin:  No rashes or skin changes. Neuro:  No headache, numbness or weakness, balance or coordination issues. Endocrine:  No diabetes, thyroid issues, hot flashes or night sweats. Psych:  No mood changes, depression or anxiety. Pain:  No focal pain. Review of systems:  All other systems reviewed and found to be negative.  Physical Exam: Blood pressure 176/98, pulse 97, temperature 99.4 F (37.4 C), resp. rate 18, height '5\' 4"'  (1.626 m), weight 347 lb 10.7 oz (157.7 kg), last menstrual period 07/17/2012. GENERAL:  Well developed, well nourished, woman sitting comfortably in the exam room in no acute distress. MENTAL STATUS:  Alert and oriented to person, place and time. HEAD:  Shoulder length brown hair.  Normocephalic, atraumatic, face symmetric, no Cushingoid features. EYES:  Blue eyes.  Pupils equal round and reactive to light and accomodation.  No conjunctivitis or scleral icterus. ENT:  Oropharynx clear without lesion.  Tongue normal. Mucous membranes moist.  RESPIRATORY:  Clear to auscultation without rales, wheezes or rhonchi. CARDIOVASCULAR:  Regular rate and rhythm without murmur, rub or gallop. BREASTS:  Right breast with post operative and post radiation changes.  No discrete masses, skin changes or nipple discharge.  Left breast without masses, skin changes or nipple discharge. ABDOMEN:  Soft, non-tender, with active bowel sounds, and no hepatosplenomegaly.  No masses. SKIN:  No rashes, ulcers or lesions. EXTREMITIES: No edema, no skin discoloration or tenderness.  No palpable cords. LYMPH NODES: No palpable cervical, supraclavicular, axillary or inguinal adenopathy  NEUROLOGICAL: Unremarkable. PSYCH:  Appropriate.  Appointment on 01/18/2015   Component Date Value Ref Range Status  . WBC 01/18/2015 10.6  3.6 - 11.0 K/uL Final  . RBC 01/18/2015 4.19  3.80 - 5.20 MIL/uL Final  . Hemoglobin 01/18/2015 11.5* 12.0 - 16.0 g/dL Final  . HCT 01/18/2015 36.1  35.0 - 47.0 % Final  . MCV 01/18/2015 86.2  80.0 - 100.0 fL Final  . MCH 01/18/2015 27.4  26.0 - 34.0 pg Final  . MCHC 01/18/2015 31.8* 32.0 - 36.0 g/dL Final  . RDW 01/18/2015 16.7* 11.5 - 14.5 % Final  . Platelets 01/18/2015 235  150 - 440 K/uL Final  . Neutrophils Relative % 01/18/2015 76   Final  . Neutro Abs 01/18/2015 8.0* 1.4 - 6.5 K/uL Final  . Lymphocytes Relative 01/18/2015 13   Final  . Lymphs Abs 01/18/2015 1.4  1.0 - 3.6 K/uL Final  . Monocytes Relative 01/18/2015 7   Final  . Monocytes Absolute 01/18/2015 0.8  0.2 - 0.9 K/uL Final  .  Eosinophils Relative 01/18/2015 4   Final  . Eosinophils Absolute 01/18/2015 0.4  0 - 0.7 K/uL Final  . Basophils Relative 01/18/2015 0   Final  . Basophils Absolute 01/18/2015 0.0  0 - 0.1 K/uL Final  . Sodium 01/18/2015 138  135 - 145 mmol/L Final  . Potassium 01/18/2015 3.8  3.5 - 5.1 mmol/L Final  . Chloride 01/18/2015 102  101 - 111 mmol/L Final  . CO2 01/18/2015 28  22 - 32 mmol/L Final  . Glucose, Bld 01/18/2015 141* 65 - 99 mg/dL Final  . BUN 01/18/2015 36* 6 - 20 mg/dL Final  . Creatinine, Ser 01/18/2015 0.93  0.44 - 1.00 mg/dL Final  . Calcium 01/18/2015 8.8* 8.9 - 10.3 mg/dL Final  . Total Protein 01/18/2015 7.2  6.5 - 8.1 g/dL Final  . Albumin 01/18/2015 3.5  3.5 - 5.0 g/dL Final  . AST 01/18/2015 20  15 - 41 U/L Final  . ALT 01/18/2015 18  14 - 54 U/L Final  . Alkaline Phosphatase 01/18/2015 137* 38 - 126 U/L Final  . Total Bilirubin 01/18/2015 0.3  0.3 - 1.2 mg/dL Final  . GFR calc non Af Amer 01/18/2015 >60  >60 mL/min Final  . GFR calc Af Amer 01/18/2015 >60  >60 mL/min Final   Comment: (NOTE) The eGFR has been calculated using the CKD EPI equation. This calculation has not been validated in all clinical  situations. eGFR's persistently <60 mL/min signify possible Chronic Kidney Disease.   . Anion gap 01/18/2015 8  5 - 15 Final    Assessment:  Nicole Schmidt is a 49 y.o. female with a history of stage I right breast cancer status post wide local incision on 08/12/2012.  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  Sentinel lymph node was negative.  Tumor was ER/PR positive and Her2/neu negative.  Oncotype DX score was low.  She completed breast radiation in 10/2012.  She has been on tamoxifen since 10/2012.  Bilateral mammogram on 09/02/2013 revealed no suspicious findings.  Right sided mammogram on 04/03/2014 revealed no suspicious findings.  The patient had a bone density study 2 years ago (unknown results).  She discontinued tamoxifen on 10/19/2014 following a submassive PE.  She had a bilateral submassive pulmonary emboli on 10/18/2014.  Chest CT angiogram revealed submassive PE with right heart strain.  She received catheter directed lytic therapy at [facility].  Lower extremity duplex was negative for DVT.  She is on Xarelto.    Hypercoagulable work-up revealed the following normal studies:  Factor V Leiden, prothrombin gene mutation, lupus anticoagulant panel, anticardiolipin antibodies, protein S total (127%), protein S activity (99%), protein C total (71%), protein C activity (77%), and ATIII activity (82%).  Symptomatically, she denies any breast concerns.  She has had no menses since 07/2012.  Exam is unremarkable.  Plan: 1. Labs today:  CBC with diff, CMP, CA27.29, ATIII antigen, FSH, estradiol. 2. Discuss risk factors for thrombosis.  Discuss risk of thrombosis on tamoxifen (5%) versus Femara (2-3%). 3. Discuss risk versus benefit of Femara.  Discuss need for anticoagulation during hormonal therapy if AI chosen. 4. Schedule bone density study. 5. RTC in 2 weeks for MD assess, review of labs and bone density study.   Lequita Asal, MD  01/18/2015, 3:26 PM

## 2015-01-19 LAB — ANTITHROMBIN III: AntiThromb III Func: 100 % (ref 75–120)

## 2015-01-19 LAB — CANCER ANTIGEN 27.29: CA 27.29: 20.7 U/mL (ref 0.0–38.6)

## 2015-01-19 LAB — FOLLICLE STIMULATING HORMONE: FSH: 35.7 m[IU]/mL

## 2015-01-19 LAB — ESTRADIOL: Estradiol: 43.4 pg/mL

## 2015-01-26 ENCOUNTER — Ambulatory Visit
Admission: RE | Admit: 2015-01-26 | Discharge: 2015-01-26 | Disposition: A | Discharge status: Home / Self Care | Payer: BLUE CROSS/BLUE SHIELD | Source: Ambulatory Visit | Attending: Hematology and Oncology | Admitting: Hematology and Oncology

## 2015-01-26 DIAGNOSIS — Z780 Asymptomatic menopausal state: Secondary | ICD-10-CM | POA: Diagnosis not present

## 2015-01-26 DIAGNOSIS — C50911 Malignant neoplasm of unspecified site of right female breast: Secondary | ICD-10-CM | POA: Diagnosis present

## 2015-02-01 ENCOUNTER — Ambulatory Visit: Payer: BLUE CROSS/BLUE SHIELD | Admitting: Hematology and Oncology

## 2015-02-02 ENCOUNTER — Inpatient Hospital Stay (HOSPITAL_BASED_OUTPATIENT_CLINIC_OR_DEPARTMENT_OTHER): Payer: BLUE CROSS/BLUE SHIELD | Admitting: Hematology and Oncology

## 2015-02-02 ENCOUNTER — Other Ambulatory Visit: Payer: Self-pay

## 2015-02-02 ENCOUNTER — Encounter: Payer: Self-pay | Admitting: Hematology and Oncology

## 2015-02-02 VITALS — BP 114/80 | HR 84 | Temp 99.0°F | Resp 18 | Ht 62.0 in | Wt 351.4 lb

## 2015-02-02 DIAGNOSIS — Z79899 Other long term (current) drug therapy: Secondary | ICD-10-CM | POA: Diagnosis not present

## 2015-02-02 DIAGNOSIS — C50911 Malignant neoplasm of unspecified site of right female breast: Secondary | ICD-10-CM

## 2015-02-02 DIAGNOSIS — I2609 Other pulmonary embolism with acute cor pulmonale: Secondary | ICD-10-CM

## 2015-02-02 DIAGNOSIS — Z923 Personal history of irradiation: Secondary | ICD-10-CM

## 2015-02-02 DIAGNOSIS — Z170 Estrogen receptor positive status [ER+]: Secondary | ICD-10-CM

## 2015-02-02 DIAGNOSIS — I2699 Other pulmonary embolism without acute cor pulmonale: Secondary | ICD-10-CM | POA: Diagnosis not present

## 2015-02-02 NOTE — Progress Notes (Signed)
[facility] day:  02/02/2015   Chief Complaint: Nicole Schmidt is a 49 y.o. female with stage I breast cancer who is seen review of interval testing status post discontinuation of tamoxifen after a submassive pulmonary embolism.  HPI: The patient was last seen in the medical oncology clinic on 01/18/2015.  At that time, she denied any breast concerns.  She had been off tamoxifen since her submassive pulmonary embolism on 10/19/2014.  She was on Xarelto.  We discussed the risk versus benefit of initiation of an aromatase inhibitor.  We discussed the need for anticoagulation during the entire course of hormonal therapy, if chosen.  FSH was 35.7 with an estradiol of 43.4 (consistent with post-menopausal state).  Bone density study on 01/26/2015 was normal.  During the interim, she denies any concerns.  She remains on Xarelto 20 mg a day.  Past Medical History  Diagnosis Date  . Arthritis   . Menopause     age 89  . Hypertension   . Gout   . Thyroid goiter   . Breast cancer of upper-inner quadrant of right female breast ([facility])     Right breast invasive CA and DCIS , T1,N0,M0. Er/PR pos, her 2 negative  . Diabetes mellitus without complication ([place])   . Renal insufficiency   . Anemia   . Gout     Past Surgical History  Procedure Laterality Date  . Breast surgery Right 2014    with sentinel node bx subareaolar duct excision  . Breast biopsy Right 2014    Family History  Problem Relation Age of Onset  . Diabetes Father   . Hypertension Father   . Parkinson's disease Father   . Hypertension Mother   . Rheum arthritis Mother   . Atrial fibrillation Mother   . Heart failure Mother     Social History:  reports that she has never smoked. She has never used smokeless tobacco. She reports that she does not drink alcohol or use illicit drugs.  Her mother died of a MI.  Her father is in a nursing home.  She visits him daily.  He has Parkinson's  disease and dementia.  She is an only child.  She has never married.  The patient is alone today.  Allergies: No Known Allergies  Current Medications: Current Outpatient Prescriptions  Medication Sig Dispense Refill  . allopurinol (ZYLOPRIM) 300 MG tablet     . ARMOUR THYROID 60 MG tablet Take 2 tablets by mouth daily.    Marland Kitchen atorvastatin (LIPITOR) 20 MG tablet Take 20 mg by mouth daily.    . bromocriptine (PARLODEL) 5 MG capsule Take 5 mg by mouth daily.    . Calcium Carbonate-Vitamin D (CALCIUM 600 + D PO) Take 1 tablet by mouth daily.    Marland Kitchen CINNAMON PO Take 2,000 mg by mouth daily.    . Dulaglutide (TRULICITY) 1.5 PJ/8.2NK SOPN Inject into the skin once a week.    . ergocalciferol (VITAMIN D2) 50000 UNITS capsule Take 50,000 Units by mouth once a week. Take on Fridays.    . ferrous sulfate 325 (65 FE) MG tablet Take 325 mg by mouth daily.    . folic acid (FOLVITE) 539 MCG tablet Take 800 mcg by mouth daily.     . furosemide (LASIX) 20 MG tablet Take 20 mg by mouth.    Marland Kitchen lisinopril-hydrochlorothiazide (PRINZIDE,ZESTORETIC) 20-25 MG per tablet Take 1 tablet by mouth daily.    . metFORMIN (GLUCOPHAGE)  1000 MG tablet Take 1 tablet by mouth 2 (two) times daily.    Marland Kitchen nystatin (MYCOSTATIN/NYSTOP) 100000 UNIT/GM POWD Apply topically to rash 3 times a day 30 g 1  . rivaroxaban (XARELTO) 20 MG TABS tablet Take 1 tablet (20 mg total) by mouth daily with supper. 30 tablet 0   No current facility-administered medications for this visit.    Review of Systems:  GENERAL:  Feels "alright".  No fevers, sweats or weight loss. PERFORMANCE STATUS (ECOG):  0 HEENT:  No visual changes, runny nose, sore throat, mouth sores or tenderness. Lungs: No increased shortness of breath.  No cough.  No hemoptysis. Cardiac:  No chest pain, palpitations, orthopnea, or PND. GI:  No nausea, vomiting, diarrhea, constipation, melena or hematochezia. GU:  No urgency, frequency, dysuria, or hematuria. Musculoskeletal:  No  back pain.  No joint pain.  No muscle tenderness. Extremities:  No pain or swelling. Skin:  No rashes or skin changes. Neuro:  No headache, numbness or weakness, balance or coordination issues. Endocrine: Diabetes.  No tthyroid issues, hot flashes or night sweats. Psych:  No mood changes, depression or anxiety. Pain:  No focal pain. Review of systems:  All other systems reviewed and found to be negative.  Physical Exam: Blood pressure 114/80, pulse 84, temperature 99 F (37.2 C), resp. rate 18, height _0  (1.575 m), weight 351 lb 6.6 oz (159.4 kg), last menstrual period 07/17/2012. GENERAL:  Well developed, well nourished, heavy set woman sitting comfortably in the exam room in no acute distress. MENTAL STATUS:  Alert and oriented to person, place and time. HEAD:  Shoulder length brown hair.  Normocephalic, atraumatic, face symmetric, no Cushingoid features. EYES:  Blue eyes.  No conjunctivitis or scleral icterus. NEUROLOGICAL: Unremarkable. PSYCH:  Appropriate.  No visits with results within 3 Day(s) from this visit. Latest known visit with results is:  Appointment on 01/18/2015  Component Date Value Ref Range Status  . WBC 01/18/2015 10.6  3.6 - 11.0 K/uL Final  . RBC 01/18/2015 4.19  3.80 - 5.20 MIL/uL Final  . Hemoglobin 01/18/2015 11.5* 12.0 - 16.0 g/dL Final  . HCT 01/18/2015 36.1  35.0 - 47.0 % Final  . MCV 01/18/2015 86.2  80.0 - 100.0 fL Final  . MCH 01/18/2015 27.4  26.0 - 34.0 pg Final  . MCHC 01/18/2015 31.8* 32.0 - 36.0 g/dL Final  . RDW 01/18/2015 16.7* 11.5 - 14.5 % Final  . Platelets 01/18/2015 235  150 - 440 K/uL Final  . Neutrophils Relative % 01/18/2015 76   Final  . Neutro Abs 01/18/2015 8.0* 1.4 - 6.5 K/uL Final  . Lymphocytes Relative 01/18/2015 13   Final  . Lymphs Abs 01/18/2015 1.4  1.0 - 3.6 K/uL Final  . Monocytes Relative 01/18/2015 7   Final  . Monocytes Absolute 01/18/2015 0.8  0.2 - 0.9 K/uL Final  . Eosinophils Relative 01/18/2015 4   Final  .  Eosinophils Absolute 01/18/2015 0.4  0 - 0.7 K/uL Final  . Basophils Relative 01/18/2015 0   Final  . Basophils Absolute 01/18/2015 0.0  0 - 0.1 K/uL Final  . Sodium 01/18/2015 138  135 - 145 mmol/L Final  . Potassium 01/18/2015 3.8  3.5 - 5.1 mmol/L Final  . Chloride 01/18/2015 102  101 - 111 mmol/L Final  . CO2 01/18/2015 28  22 - 32 mmol/L Final  . Glucose, Bld 01/18/2015 141* 65 - 99 mg/dL Final  . BUN 01/18/2015 36* 6 - 20 mg/dL Final  .  Creatinine, Ser 01/18/2015 0.93  0.44 - 1.00 mg/dL Final  . Calcium 01/18/2015 8.8* 8.9 - 10.3 mg/dL Final  . Total Protein 01/18/2015 7.2  6.5 - 8.1 g/dL Final  . Albumin 01/18/2015 3.5  3.5 - 5.0 g/dL Final  . AST 01/18/2015 20  15 - 41 U/L Final  . ALT 01/18/2015 18  14 - 54 U/L Final  . Alkaline Phosphatase 01/18/2015 137* 38 - 126 U/L Final  . Total Bilirubin 01/18/2015 0.3  0.3 - 1.2 mg/dL Final  . GFR calc non Af Amer 01/18/2015 >60  >60 mL/min Final  . GFR calc Af Amer 01/18/2015 >60  >60 mL/min Final   Comment: (NOTE) The eGFR has been calculated using the CKD EPI equation. This calculation has not been validated in all clinical situations. eGFR's persistently <60 mL/min signify possible Chronic Kidney Disease.   . Anion gap 01/18/2015 8  5 - 15 Final  . CA 27.29 01/18/2015 20.7  0.0 - 38.6 U/mL Final   Comment: (NOTE) Bayer Centaur/ACS methodology Performed At: [facility] [place], [place] [id number] Lindon Romp MD OI:[id number]   . AntiThromb III Func 01/18/2015 100  75 - 120 % Final   Performed at [facility]  . [facility] 01/18/2015 35.7   Final   Comment: (NOTE)                    Follicular phase        3.5 -  12.5                    Ovulation phase         4.7 -  21.5                    Luteal phase            1.7 -   7.7                    Postmenopausal         25.8 - 134.8 Performed At: [facility] [place], [place] [id number] Lindon Romp MD QH:[id number]    . Estradiol 01/18/2015 43.4   Final   Comment: (NOTE)                    Adult Female:                      Follicular phase   46.5 -   166.0                      Ovulation phase    85.8 -   498.0                      Luteal phase       43.8 -   211.0                      Postmenopausal     <6.0 -    54.7                    Pregnancy                      1st trimester     215.0 - >4300.0  Girls (1-10 years)    6.0 -    27.0 Roche ECLIA methodology Performed At: [facility] [address] [place], [place] [id number] Lindon Romp MD VA:[id number]     Assessment:  Nicole Schmidt is a 49 y.o. female with a history of stage I right breast cancer status post wide local incision on 08/12/2012.  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  Sentinel lymph node was negative.  Tumor was ER/PR positive and Her2/neu negative.  Oncotype DX score was 14 (low risk).  She completed breast radiation in 10/2012.  She has been on tamoxifen since 10/2012.  Bilateral mammogram on 09/02/2013 revealed no suspicious findings.  Right sided mammogram on 04/03/2014 revealed no suspicious findings.  The patient had a bone density study 2 years ago (unknown results).  She discontinued tamoxifen on 10/19/2014 following a submassive PE.  She had a bilateral submassive pulmonary emboli on 10/18/2014.  Chest CT angiogram revealed submassive PE with right heart strain.  She received catheter directed lytic [facility].  Lower extremity duplex was negative for DVT.  She is on Xarelto.    Hypercoagulable work-up revealed the following normal studies:  Factor V Leiden, prothrombin gene mutation, lupus anticoagulant panel, anticardiolipin antibodies, protein S total (127%), protein S activity (99%), protein C total (71%), protein C activity (77%), and ATIII activity (82%).  Bone density study on 01/26/2015 revealed a T score of -0.3 (normal).  She has had no menses  since 07/2012.  Labs on 01/18/2015 confirmed a post-menopausal status.  Symptomatically, she denies any concerns.  Exam is unremarkable.  Plan: 1. Review results from labs and bone density. 2. Re-review risk of thrombosis on tamoxifen (5%) versus Femara (2-3%).  Discuss risk versus benefit of Femara.  Discuss Oncotype DX score and risk of recurrence on tamoxifen.  Discuss secondary internet site for risk for recurrence with and without hormonal therapy.  Await Adjuvant! Online data.  Re-review need for anticoagulation during hormonal therapy if AI chosen.  Discuss postponing initiation of aromatase inhibitor. 3. Run Adjuvant! Online when available. 4. RTC in 4 months for MD assess and labs (CBC with diff, CMP, CA27.29).   Lequita Asal, MD  02/02/2015, 10:26 AM

## 2015-02-02 NOTE — Progress Notes (Signed)
Patient is here for follow-up of her bone density scan results. She states that she has been doing good and has nothing new since her last visit.

## 2015-06-08 ENCOUNTER — Other Ambulatory Visit: Payer: BLUE CROSS/BLUE SHIELD

## 2015-06-08 ENCOUNTER — Ambulatory Visit: Payer: BLUE CROSS/BLUE SHIELD | Admitting: Hematology and Oncology

## 2015-06-10 ENCOUNTER — Inpatient Hospital Stay (HOSPITAL_BASED_OUTPATIENT_CLINIC_OR_DEPARTMENT_OTHER): Payer: BLUE CROSS/BLUE SHIELD | Admitting: Hematology and Oncology

## 2015-06-10 ENCOUNTER — Other Ambulatory Visit: Payer: BLUE CROSS/BLUE SHIELD

## 2015-06-10 ENCOUNTER — Ambulatory Visit: Payer: BLUE CROSS/BLUE SHIELD | Admitting: Hematology and Oncology

## 2015-06-10 ENCOUNTER — Inpatient Hospital Stay: Payer: BLUE CROSS/BLUE SHIELD | Attending: Hematology and Oncology

## 2015-06-10 VITALS — BP 129/76 | HR 83 | Temp 99.9°F | Resp 19 | Wt 326.8 lb

## 2015-06-10 DIAGNOSIS — Z79810 Long term (current) use of selective estrogen receptor modulators (SERMs): Secondary | ICD-10-CM | POA: Insufficient documentation

## 2015-06-10 DIAGNOSIS — C50211 Malignant neoplasm of upper-inner quadrant of right female breast: Secondary | ICD-10-CM | POA: Diagnosis not present

## 2015-06-10 DIAGNOSIS — Z86711 Personal history of pulmonary embolism: Secondary | ICD-10-CM

## 2015-06-10 DIAGNOSIS — C50911 Malignant neoplasm of unspecified site of right female breast: Secondary | ICD-10-CM

## 2015-06-10 DIAGNOSIS — Z79899 Other long term (current) drug therapy: Secondary | ICD-10-CM | POA: Diagnosis not present

## 2015-06-10 DIAGNOSIS — G20 Parkinson's disease: Secondary | ICD-10-CM | POA: Insufficient documentation

## 2015-06-10 DIAGNOSIS — I10 Essential (primary) hypertension: Secondary | ICD-10-CM | POA: Diagnosis not present

## 2015-06-10 DIAGNOSIS — E119 Type 2 diabetes mellitus without complications: Secondary | ICD-10-CM | POA: Insufficient documentation

## 2015-06-10 DIAGNOSIS — Z7901 Long term (current) use of anticoagulants: Secondary | ICD-10-CM

## 2015-06-10 DIAGNOSIS — M109 Gout, unspecified: Secondary | ICD-10-CM | POA: Insufficient documentation

## 2015-06-10 DIAGNOSIS — Z170 Estrogen receptor positive status [ER+]: Secondary | ICD-10-CM | POA: Diagnosis not present

## 2015-06-10 DIAGNOSIS — I2782 Chronic pulmonary embolism: Secondary | ICD-10-CM

## 2015-06-10 DIAGNOSIS — M1990 Unspecified osteoarthritis, unspecified site: Secondary | ICD-10-CM | POA: Insufficient documentation

## 2015-06-10 LAB — CBC WITH DIFFERENTIAL/PLATELET
Basophils Absolute: 0.1 10*3/uL (ref 0–0.1)
Basophils Relative: 1 %
Eosinophils Absolute: 0.4 10*3/uL (ref 0–0.7)
Eosinophils Relative: 4 %
HCT: 33.2 % — ABNORMAL LOW (ref 35.0–47.0)
Hemoglobin: 10.7 g/dL — ABNORMAL LOW (ref 12.0–16.0)
Lymphocytes Relative: 11 %
Lymphs Abs: 1.1 10*3/uL (ref 1.0–3.6)
MCH: 26.7 pg (ref 26.0–34.0)
MCHC: 32.4 g/dL (ref 32.0–36.0)
MCV: 82.4 fL (ref 80.0–100.0)
Monocytes Absolute: 0.7 10*3/uL (ref 0.2–0.9)
Monocytes Relative: 7 %
Neutro Abs: 8.1 10*3/uL — ABNORMAL HIGH (ref 1.4–6.5)
Neutrophils Relative %: 77 %
Platelets: 197 10*3/uL (ref 150–440)
RBC: 4.02 MIL/uL (ref 3.80–5.20)
RDW: 17.3 % — ABNORMAL HIGH (ref 11.5–14.5)
WBC: 10.4 10*3/uL (ref 3.6–11.0)

## 2015-06-10 LAB — COMPREHENSIVE METABOLIC PANEL
ALT: 19 U/L (ref 14–54)
AST: 22 U/L (ref 15–41)
Albumin: 4 g/dL (ref 3.5–5.0)
Alkaline Phosphatase: 98 U/L (ref 38–126)
Anion gap: 8 (ref 5–15)
BUN: 43 mg/dL — ABNORMAL HIGH (ref 6–20)
CO2: 27 mmol/L (ref 22–32)
Calcium: 9.6 mg/dL (ref 8.9–10.3)
Chloride: 106 mmol/L (ref 101–111)
Creatinine, Ser: 1.34 mg/dL — ABNORMAL HIGH (ref 0.44–1.00)
GFR calc Af Amer: 52 mL/min — ABNORMAL LOW (ref >60)
GFR calc non Af Amer: 45 mL/min — ABNORMAL LOW (ref >60)
Glucose, Bld: 118 mg/dL — ABNORMAL HIGH (ref 65–99)
Potassium: 4.1 mmol/L (ref 3.5–5.1)
Sodium: 141 mmol/L (ref 135–145)
Total Bilirubin: 0.5 mg/dL (ref 0.3–1.2)
Total Protein: 7.6 g/dL (ref 6.5–8.1)

## 2015-06-10 MED ORDER — LETROZOLE 2.5 MG PO TABS: 2.5000 mg | ORAL_TABLET | Freq: Every day | ORAL | Status: DC

## 2015-06-10 NOTE — Patient Instructions (Signed)
Letrozole tablets What is this medicine? LETROZOLE (LET roe zole) blocks the production of estrogen. Certain types of breast cancer grow under the influence of estrogen. Letrozole helps block tumor growth. This medicine is used to treat advanced breast cancer in postmenopausal women. This medicine may be used for other purposes; ask your health care provider or pharmacist if you have questions. What should I tell my health care provider before I take this medicine? They need to know if you have any of these conditions: -liver disease -osteoporosis (weak bones) -an unusual or allergic reaction to letrozole, other medicines, foods, dyes, or preservatives -pregnant or trying to get pregnant -breast-feeding How should I use this medicine? Take this medicine by mouth with a glass of water. You may take it with or without food. Follow the directions on the prescription label. Take your medicine at regular intervals. Do not take your medicine more often than directed. Do not stop taking except on your doctor's advice. Talk to your pediatrician regarding the use of this medicine in children. Special care may be needed. Overdosage: If you think you have taken too much of this medicine contact a poison control center or emergency room at once. NOTE: This medicine is only for you. Do not share this medicine with others. What if I miss a dose? If you miss a dose, take it as soon as you can. If it is almost time for your next dose, take only that dose. Do not take double or extra doses. What may interact with this medicine? Do not take this medicine with any of the following medications: -estrogens, like hormone replacement therapy or birth control pills This medicine may also interact with the following medications: -dietary supplements such as androstenedione or DHEA -prasterone -tamoxifen This list may not describe all possible interactions. Give your health care provider a list of all the medicines,  herbs, non-prescription drugs, or dietary supplements you use. Also tell them if you smoke, drink alcohol, or use illegal drugs. Some items may interact with your medicine. What should I watch for while using this medicine? Visit your doctor or health care professional for regular check-ups to monitor your condition. Do not use this drug if you are pregnant. Serious side effects to an unborn child are possible. Talk to your doctor or pharmacist for more information. You may get drowsy or dizzy. Do not drive, use machinery, or do anything that needs mental alertness until you know how this medicine affects you. Do not stand or sit up quickly, especially if you are an older patient. This reduces the risk of dizzy or fainting spells. What side effects may I notice from receiving this medicine? Side effects that you should report to your doctor or health care professional as soon as possible: -allergic reactions like skin rash, itching, or hives -bone fracture -chest pain -difficulty breathing or shortness of breath -severe pain, swelling, warmth in the leg -unusually weak or tired -vaginal bleeding Side effects that usually do not require medical attention (report to your doctor or health care professional if they continue or are bothersome): -bone, back, joint, or muscle pain -dizziness -fatigue -fluid retention -headache -hot flashes, night sweats -nausea -weight gain This list may not describe all possible side effects. Call your doctor for medical advice about side effects. You may report side effects to FDA at 1-800-FDA-1088. Where should I keep my medicine? Keep out of the reach of children. Store between 15 and 30 degrees C (59 and 86 degrees F). Throw away  any unused medicine after the expiration date. NOTE: This sheet is a summary. It may not cover all possible information. If you have questions about this medicine, talk to your doctor, pharmacist, or health care provider.     2016, Elsevier/Gold Standard. (2007-04-12 16:43:44)

## 2015-06-10 NOTE — Progress Notes (Signed)
[facility] day:  06/10/2015    Chief Complaint: Nicole Schmidt is a 50 y.o. female with stage I breast cancer and a history of submassive pulmonary embolism who is seen for 4 month assessment.  HPI: The patient was last seen in the medical oncology clinic on 02/02/2015.  At that time, bone density study was normal.  She was postmenopausal.  We discussed consideration of a switch to an aromatase inhibitor given her submassive pulmonary embolism.  Decision was made to postpone initiation of therapy.  During the interim, she has done well.  She remains on Xarelto.  She denies any bruising or bleeding.  She denies any breast concerns.   Past Medical History  Diagnosis Date  . Arthritis   . Menopause     age 36  . Hypertension   . Gout   . Thyroid goiter   . Breast cancer of upper-inner quadrant of right female breast ([place])     Right breast invasive CA and DCIS , T1,N0,M0. Er/PR pos, her 2 negative  . Diabetes mellitus without complication ([place])   . Renal insufficiency   . Anemia   . Gout     Past Surgical History  Procedure Laterality Date  . Breast surgery Right 2014    with sentinel node bx subareaolar duct excision  . Breast biopsy Right 2014    Family History  Problem Relation Age of Onset  . Diabetes Father   . Hypertension Father   . Parkinson's disease Father   . Hypertension Mother   . Rheum arthritis Mother   . Atrial fibrillation Mother   . Heart failure Mother     Social History:  reports that she has never smoked. She has never used smokeless tobacco. She reports that she does not drink alcohol or use illicit drugs.  Her mother died of a MI.  Her father is in a nursing home.  She visits him daily.  He has Parkinson's disease and dementia.  She is an only child.  She has never married.  The patient is alone today.  Allergies: No Known Allergies  Current Medications: Current Outpatient Prescriptions  Medication Sig  Dispense Refill  . allopurinol (ZYLOPRIM) 300 MG tablet     . ARMOUR THYROID 60 MG tablet Take 2 tablets by mouth daily.    Marland Kitchen atorvastatin (LIPITOR) 20 MG tablet Take 20 mg by mouth daily.    . bromocriptine (PARLODEL) 2.5 MG tablet Take 2.5 mg by mouth.    . Calcium Carbonate-Vitamin D (CALCIUM 600 + D PO) Take 1 tablet by mouth daily.    Marland Kitchen CINNAMON PO Take 2,000 mg by mouth daily.    . Dulaglutide (TRULICITY) 1.5 YJ/8.5UD SOPN Inject into the skin once a week.    . ergocalciferol (VITAMIN D2) 50000 UNITS capsule Take 50,000 Units by mouth once a week. Take on Fridays.    . ferrous sulfate 325 (65 FE) MG tablet Take 325 mg by mouth daily.    . folic acid (FOLVITE) 149 MCG tablet Take 800 mcg by mouth daily.     . furosemide (LASIX) 20 MG tablet Take 20 mg by mouth.    Marland Kitchen lisinopril-hydrochlorothiazide (PRINZIDE,ZESTORETIC) 20-25 MG per tablet Take 1 tablet by mouth daily.    . metFORMIN (GLUCOPHAGE) 1000 MG tablet Take 1 tablet by mouth 2 (two) times daily.    Marland Kitchen NOVOFINE 30G X 8 MM MISC     . nystatin (MYCOSTATIN/NYSTOP) 100000 UNIT/GM POWD  Apply topically to rash 3 times a day 30 g 1  . ONE TOUCH ULTRA TEST test strip     . ONETOUCH DELICA LANCETS FINE MISC     . rivaroxaban (XARELTO) 20 MG TABS tablet Take 1 tablet (20 mg total) by mouth daily with supper. 30 tablet 0  . letrozole (FEMARA) 2.5 MG tablet Take 1 tablet (2.5 mg total) by mouth daily. 30 tablet 1   No current facility-administered medications for this visit.    Review of Systems:  GENERAL:  Feels "fine".  No fevers or sweats.  Weight loss of 25 pounds. PERFORMANCE STATUS (ECOG):  0 HEENT:  No visual changes, runny nose, sore throat, mouth sores or tenderness. Lungs: No increased shortness of breath.  No cough.  No hemoptysis. Cardiac:  No chest pain, palpitations, orthopnea, or PND. GI:  No nausea, vomiting, diarrhea, constipation, melena or hematochezia. GU:  No urgency, frequency, dysuria, or  hematuria. Musculoskeletal:  No back pain.  No joint pain.  No muscle tenderness. Extremities:  No pain or swelling. Skin:  No rashes or skin changes. Neuro:  No headache, numbness or weakness, balance or coordination issues. Endocrine: Diabetes.  No tthyroid issues, hot flashes or night sweats. Psych:  No mood changes, depression or anxiety. Pain:  No focal pain. Review of systems:  All other systems reviewed and found to be negative.  Physical Exam: Blood pressure 129/76, pulse 83, temperature 99.9 F (37.7 C), temperature source Tympanic, resp. rate 19, weight 326 lb 13.3 oz (148.25 kg), last menstrual period 07/17/2012. GENERAL:  Well developed, well nourished, heavy set woman sitting comfortably in the exam room in no acute distress. MENTAL STATUS:  Alert and oriented to person, place and time. HEAD:  Shoulder length brown hair.  Normocephalic, atraumatic, face symmetric, no Cushingoid features. EYES: Blue eyes. Pupils equal round and reactive to light and accomodation. No conjunctivitis or scleral icterus. ENT: Oropharynx clear without lesion. Tongue normal. Mucous membranes moist.  RESPIRATORY: Clear to auscultation without rales, wheezes or rhonchi. CARDIOVASCULAR: Regular rate and rhythm without murmur, rub or gallop. BREASTS: Right breast with post operative and post radiation changes. No discrete masses, skin changes or nipple discharge. Left breast without masses, skin changes or nipple discharge. ABDOMEN: Soft, non-tender, with active bowel sounds, and no appreciable hepatosplenomegaly. No masses. SKIN: No rashes, ulcers or lesions. EXTREMITIES: No edema, no skin discoloration or tenderness. No palpable cords. LYMPH NODES: No palpable cervical, supraclavicular, axillary or inguinal adenopathy  NEUROLOGICAL: Unremarkable. PSYCH: Appropriate.   Appointment on 06/10/2015  Component Date Value Ref Range Status  . WBC 06/10/2015 10.4  3.6 - 11.0 K/uL Final  .  RBC 06/10/2015 4.02  3.80 - 5.20 MIL/uL Final  . Hemoglobin 06/10/2015 10.7* 12.0 - 16.0 g/dL Final  . HCT 06/10/2015 33.2* 35.0 - 47.0 % Final  . MCV 06/10/2015 82.4  80.0 - 100.0 fL Final  . MCH 06/10/2015 26.7  26.0 - 34.0 pg Final  . MCHC 06/10/2015 32.4  32.0 - 36.0 g/dL Final  . RDW 06/10/2015 17.3* 11.5 - 14.5 % Final  . Platelets 06/10/2015 197  150 - 440 K/uL Final  . Neutrophils Relative % 06/10/2015 77   Final  . Lymphocytes Relative 06/10/2015 11.000000   Final  . Monocytes Relative 06/10/2015 7.000000   Final  . Eosinophils Relative 06/10/2015 4.000000   Final  . Basophils Relative 06/10/2015 1.000000   Final  . Neutro Abs 06/10/2015 8.1* 1.4 - 6.5 K/uL Final  . Lymphs Abs 06/10/2015  1.1  1.0 - 3.6 K/uL Final  . Monocytes Absolute 06/10/2015 0.7  0.2 - 0.9 K/uL Final  . Eosinophils Absolute 06/10/2015 0.4  0 - 0.7 K/uL Final  . Basophils Absolute 06/10/2015 0.1  0 - 0.1 K/uL Final  . Sodium 06/10/2015 141  135 - 145 mmol/L Final  . Potassium 06/10/2015 4.1  3.5 - 5.1 mmol/L Final  . Chloride 06/10/2015 106  101 - 111 mmol/L Final  . CO2 06/10/2015 27  22 - 32 mmol/L Final  . Glucose, Bld 06/10/2015 118* 65 - 99 mg/dL Final  . BUN 06/10/2015 43* 6 - 20 mg/dL Final  . Creatinine, Ser 06/10/2015 1.34* 0.44 - 1.00 mg/dL Final  . Calcium 06/10/2015 9.6  8.9 - 10.3 mg/dL Final  . Total Protein 06/10/2015 7.6  6.5 - 8.1 g/dL Final  . Albumin 06/10/2015 4.0  3.5 - 5.0 g/dL Final  . AST 06/10/2015 22  15 - 41 U/L Final  . ALT 06/10/2015 19  14 - 54 U/L Final  . Alkaline Phosphatase 06/10/2015 98  38 - 126 U/L Final  . Total Bilirubin 06/10/2015 0.5  0.3 - 1.2 mg/dL Final  . GFR calc non Af Amer 06/10/2015 45* >60 mL/min Final  . GFR calc Af Amer 06/10/2015 52* >60 mL/min Final   Comment: (NOTE) The eGFR has been calculated using the CKD EPI equation. This calculation has not been validated in all clinical situations. eGFR's persistently <60 mL/min signify possible Chronic  Kidney Disease.   . Anion gap 06/10/2015 8  5 - 15 Final  . CA 27.29 06/10/2015 17.2  0.0 - 38.6 U/mL Final   Comment: (NOTE) [employer]/ACS methodology Performed At: [facility] [address] [place], [place] [id number] Lindon Romp MD ST:[id number]     Assessment:  Nicole Schmidt is a 50 y.o. female with a history of stage I right breast cancer status post wide local incision on 08/12/2012.  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  Sentinel lymph node was negative.  Tumor was ER/PR positive and Her2/neu negative.  Oncotype DX score was 14 (low risk).  She completed breast radiation in 10/2012.  She has been on tamoxifen since 10/2012.  Bilateral mammogram on 09/02/2013 revealed no suspicious findings.  Right sided mammogram on 04/03/2014 revealed no suspicious findings.  The patient had a bone density study 2 years ago (unknown results).  She discontinued tamoxifen on 10/19/2014 following a submassive PE.  She had a bilateral submassive pulmonary emboli on 10/18/2014.  Chest CT angiogram revealed submassive PE with right heart strain.  She received catheter directed lytic therapy at [facility].  Lower extremity duplex was negative for DVT.  She is on Xarelto.    Hypercoagulable work-up revealed the following normal studies:  Factor V Leiden, prothrombin gene mutation, lupus anticoagulant panel, anticardiolipin antibodies, protein S total (127%), protein S activity (99%), protein C total (71%), protein C activity (77%), and ATIII activity (82%).  Bone density study on 01/26/2015 revealed a T score of -0.3 (normal).  She has had no menses since 07/2012.  Labs on 01/18/2015 confirmed a post-menopausal status.  Symptomatically, she denies any concerns.  Exam is unremarkable.  Creatinine is 1.34 (CrCl 45 ml/min).  Plan: 1.  Labs today:  CBC with diff, CMP, CA27.29. 2.  Review results from [place] [facility].  Risk of recurrence is estimated at 10% with  hormonal therapy. 3.  Review results from [place].  At 15 years, in a group of  100 similar people, 7 people would have died of non-cancer causes, 5 would have died from cancer, and 78 will still be alive.  With an aromatase inhibitor, 88 people will still be alive.  Without therapy, shortened life expectancy by 1.8 years.  Benefit of an aromatase inhibitor to improve life expectancy by 0.6 years.  We discussed the risk of thrombosis with Femara (2-3%; approximately 1/2 that of tamoxifen).  Discuss continuation of Xarelto with protection against thrombosis (no guarantee).  Side effects of aromatase inhibitors reviewed.  She would like to proceed with a month trial of Femara. 4.  Rx:  Femara 2.5 mg po q day; Dis: #30 with 1 refill. 5.  RTC in 1 month for MD assessment and labs (LFTs).  Addendum:  After the patient's appointment, it was noted that her BUN/creatinine had increased.  Baseline BUN/Cr was 17/0.86 on 10/23/2014 and was 36/0.93 on 01/18/2015.  Current creatinine clearance is estimated at 45 ml/min.  Xarelto does not need adjustment unless CrCl <= 30 ml/min.  Patient will be notified to increase her fluids.  She is on lisinopril-HCTZ.  Dr. Ronnald Collum will be notified.   Lequita Asal, MD  06/10/2015, 3:33 PM

## 2015-06-11 LAB — CANCER ANTIGEN 27.29: CA 27.29: 17.2 U/mL (ref 0.0–38.6)

## 2015-06-12 ENCOUNTER — Encounter: Payer: Self-pay | Admitting: Hematology and Oncology

## 2015-06-15 ENCOUNTER — Telehealth: Payer: Self-pay | Admitting: *Deleted

## 2015-06-15 NOTE — Telephone Encounter (Signed)
Called pt yest and all I had was her home phone and it was documented on in basket.  I asked her to inc. Fluids.  Today I found out that Dr. Ronnald Collum is her PCP and I called there and they gave me her cell phone-[phone].  Then I faxed the labs with dr. Mike Gip comments to pcp office.

## 2015-07-08 ENCOUNTER — Inpatient Hospital Stay (HOSPITAL_BASED_OUTPATIENT_CLINIC_OR_DEPARTMENT_OTHER): Payer: BLUE CROSS/BLUE SHIELD | Admitting: Hematology and Oncology

## 2015-07-08 ENCOUNTER — Inpatient Hospital Stay: Payer: BLUE CROSS/BLUE SHIELD | Attending: Hematology and Oncology

## 2015-07-08 VITALS — BP 156/80 | HR 99 | Temp 98.8°F | Resp 18 | Ht 62.0 in | Wt 328.0 lb

## 2015-07-08 DIAGNOSIS — M1990 Unspecified osteoarthritis, unspecified site: Secondary | ICD-10-CM | POA: Diagnosis not present

## 2015-07-08 DIAGNOSIS — Z86711 Personal history of pulmonary embolism: Secondary | ICD-10-CM

## 2015-07-08 DIAGNOSIS — Z923 Personal history of irradiation: Secondary | ICD-10-CM

## 2015-07-08 DIAGNOSIS — Z170 Estrogen receptor positive status [ER+]: Secondary | ICD-10-CM | POA: Diagnosis not present

## 2015-07-08 DIAGNOSIS — I2782 Chronic pulmonary embolism: Secondary | ICD-10-CM

## 2015-07-08 DIAGNOSIS — I10 Essential (primary) hypertension: Secondary | ICD-10-CM | POA: Diagnosis not present

## 2015-07-08 DIAGNOSIS — M109 Gout, unspecified: Secondary | ICD-10-CM | POA: Insufficient documentation

## 2015-07-08 DIAGNOSIS — E119 Type 2 diabetes mellitus without complications: Secondary | ICD-10-CM | POA: Diagnosis not present

## 2015-07-08 DIAGNOSIS — Z7901 Long term (current) use of anticoagulants: Secondary | ICD-10-CM | POA: Diagnosis not present

## 2015-07-08 DIAGNOSIS — Z79810 Long term (current) use of selective estrogen receptor modulators (SERMs): Secondary | ICD-10-CM | POA: Diagnosis not present

## 2015-07-08 DIAGNOSIS — N289 Disorder of kidney and ureter, unspecified: Secondary | ICD-10-CM | POA: Insufficient documentation

## 2015-07-08 DIAGNOSIS — D509 Iron deficiency anemia, unspecified: Secondary | ICD-10-CM | POA: Insufficient documentation

## 2015-07-08 DIAGNOSIS — Z79899 Other long term (current) drug therapy: Secondary | ICD-10-CM | POA: Diagnosis not present

## 2015-07-08 DIAGNOSIS — C50911 Malignant neoplasm of unspecified site of right female breast: Secondary | ICD-10-CM

## 2015-07-08 DIAGNOSIS — D649 Anemia, unspecified: Secondary | ICD-10-CM | POA: Insufficient documentation

## 2015-07-08 DIAGNOSIS — N183 Chronic kidney disease, stage 3 unspecified: Secondary | ICD-10-CM

## 2015-07-08 LAB — BASIC METABOLIC PANEL
Anion gap: 6 (ref 5–15)
BUN: 41 mg/dL — ABNORMAL HIGH (ref 6–20)
CO2: 28 mmol/L (ref 22–32)
Calcium: 9.5 mg/dL (ref 8.9–10.3)
Chloride: 104 mmol/L (ref 101–111)
Creatinine, Ser: 1.21 mg/dL — ABNORMAL HIGH (ref 0.44–1.00)
GFR calc Af Amer: 59 mL/min — ABNORMAL LOW (ref >60)
GFR calc non Af Amer: 51 mL/min — ABNORMAL LOW (ref >60)
Glucose, Bld: 137 mg/dL — ABNORMAL HIGH (ref 65–99)
Potassium: 4.1 mmol/L (ref 3.5–5.1)
Sodium: 138 mmol/L (ref 135–145)

## 2015-07-08 LAB — HEPATIC FUNCTION PANEL
ALT: 22 U/L (ref 14–54)
AST: 23 U/L (ref 15–41)
Albumin: 3.9 g/dL (ref 3.5–5.0)
Alkaline Phosphatase: 111 U/L (ref 38–126)
Bilirubin, Direct: <0.1 mg/dL — ABNORMAL LOW (ref 0.1–0.5)
Total Bilirubin: 0.6 mg/dL (ref 0.3–1.2)
Total Protein: 7.4 g/dL (ref 6.5–8.1)

## 2015-07-08 LAB — FERRITIN: Ferritin: 25 ng/mL (ref 11–307)

## 2015-07-08 LAB — IRON AND TIBC
Iron: 29 ug/dL (ref 28–170)
Saturation Ratios: 6 % — ABNORMAL LOW (ref 10.4–31.8)
TIBC: 473 ug/dL — ABNORMAL HIGH (ref 250–450)
UIBC: 444 ug/dL

## 2015-07-08 LAB — VITAMIN B12: Vitamin B-12: 315 pg/mL (ref 180–914)

## 2015-07-08 MED ORDER — APIXABAN 5 MG PO TABS: 5.0000 mg | ORAL_TABLET | Freq: Two times a day (BID) | ORAL | Status: DC

## 2015-07-08 NOTE — Progress Notes (Signed)
[place] [facility] day:  07/08/2015     Chief Complaint: Nicole Schmidt is a 50 y.o. female with stage I breast cancer and a history of submassive pulmonary embolism who is seen for 1 month assessment after initiation of Femara.  HPI: The patient was last seen in the medical oncology clinic on 06/10/2015.  At that time, she denied any concerns.  Exam was unremarkable.  CBC revealed a hematocrit of 33.2, hemoglobin 10.7, and MCV 82.4.  BUN was 43 with a creatinine of 1.34 (CrCl 45 ml/min). CA27.29 was 17.2 (normal).  Decision was made to start Femara as long as she was on anticoagulation.  He describes being off of Xarelto for 2 weeks. She states that she was waking up in the morning and tasting blood in her spit.  She notes some bleeding around her gums. She has not coughed up any blood. She had no epistaxis. Xarelto was discontinued by her primary care physician on 06/26/2015.  Labs on 06/23/2015 revealed a hematocrit of 34.1, hemoglobin 10.4, MCV 86, platelets 226,000, WBC 9200 with an ANC of 7100.  Comprehensive metabolic panel revealed a creatinine of 1.4 (CrCl 44 ml/min).  Alkaline phosphatase was 126 (39-117).  PT was 11.9 with an IR of 1.2.  PTT was 27 (normal).  Symptomatically, she feels good.  She denies any further bleeding.   Past Medical History  Diagnosis Date  . Arthritis   . Menopause     age 17  . Hypertension   . Gout   . Thyroid goiter   . Breast cancer of upper-inner quadrant of right female breast ([place])     Right breast invasive CA and DCIS , T1,N0,M0. Er/PR pos, her 2 negative  . Diabetes mellitus without complication ([place])   . Renal insufficiency   . Anemia   . Gout     Past Surgical History  Procedure Laterality Date  . Breast surgery Right 2014    with sentinel node bx subareaolar duct excision  . Breast biopsy Right 2014    Family History  Problem Relation Age of Onset  . Diabetes Father   . Hypertension Father    . Parkinson's disease Father   . Hypertension Mother   . Rheum arthritis Mother   . Atrial fibrillation Mother   . Heart failure Mother     Social History:  reports that she has never smoked. She has never used smokeless tobacco. She reports that she does not drink alcohol or use illicit drugs.  Her mother died of a MI.  Her father died in 06-07-15.  He had Parkinson's disease and dementia.  She is an only child.  She has never married.  The patient is alone today.  Allergies: No Known Allergies  Current Medications: Current Outpatient Prescriptions  Medication Sig Dispense Refill  . allopurinol (ZYLOPRIM) 300 MG tablet     . ARMOUR THYROID 60 MG tablet Take 2 tablets by mouth daily.    Marland Kitchen atorvastatin (LIPITOR) 20 MG tablet Take 20 mg by mouth daily.    . bromocriptine (PARLODEL) 2.5 MG tablet Take 2.5 mg by mouth.    . Calcium Carbonate-Vitamin D (CALCIUM 600 + D PO) Take 1 tablet by mouth daily.    Marland Kitchen CINNAMON PO Take 2,000 mg by mouth daily.    . Dulaglutide (TRULICITY) 1.5 AQ/7.6AU SOPN Inject into the skin once a week.    . ergocalciferol (VITAMIN D2) 50000 UNITS capsule Take 50,000 Units by mouth  once a week. Take on Fridays.    . ferrous sulfate 325 (65 FE) MG tablet Take 325 mg by mouth daily.    . folic acid (FOLVITE) 440 MCG tablet Take 800 mcg by mouth daily.     . furosemide (LASIX) 20 MG tablet Take 20 mg by mouth.    . letrozole (FEMARA) 2.5 MG tablet Take 1 tablet (2.5 mg total) by mouth daily. 30 tablet 1  . lisinopril-hydrochlorothiazide (PRINZIDE,ZESTORETIC) 20-25 MG per tablet Take 1 tablet by mouth daily.    . metFORMIN (GLUCOPHAGE) 1000 MG tablet Take 1 tablet by mouth 2 (two) times daily.    Marland Kitchen NOVOFINE 30G X 8 MM MISC     . nystatin (MYCOSTATIN/NYSTOP) 100000 UNIT/GM POWD Apply topically to rash 3 times a day 30 g 1  . ONE TOUCH ULTRA TEST test strip     . ONETOUCH DELICA LANCETS FINE MISC     . rivaroxaban (XARELTO) 20 MG TABS tablet Take 1 tablet (20 mg total)  by mouth daily with supper. (Patient not taking: Reported on 07/08/2015) 30 tablet 0   No current facility-administered medications for this visit.    Review of Systems:  GENERAL:  Feels "fine".  No fevers or sweats.  Weight up 2 pounds. PERFORMANCE STATUS (ECOG):  0 HEENT:  Recent history of oral blood.  No visual changes, runny nose, sore throat, mouth sores or tenderness. Lungs: No increased shortness of breath.  No cough.  No hemoptysis. Cardiac:  No chest pain, palpitations, orthopnea, or PND. GI:  No nausea, vomiting, diarrhea, constipation, melena or hematochezia. GU:  No urgency, frequency, dysuria, or hematuria. Musculoskeletal:  No back pain.  No joint pain.  No muscle tenderness. Extremities:  No pain or swelling. Skin:  No rashes or skin changes. Neuro:  No headache, numbness or weakness, balance or coordination issues. Endocrine: Diabetes.  No tthyroid issues, hot flashes or night sweats. Psych:  No mood changes, depression or anxiety. Pain:  No focal pain. Review of systems:  All other systems reviewed and found to be negative.  Physical Exam: Blood pressure 156/80, pulse 99, temperature 98.8 F (37.1 C), temperature source Tympanic, resp. rate 18, height '5\' 2"'  (1.575 m), weight 328 lb 0.7 oz (148.8 kg), last menstrual period 07/17/2012. GENERAL:  Well developed, well nourished, heavy set woman sitting comfortably in the exam room in no acute distress. MENTAL STATUS:  Alert and oriented to person, place and time. HEAD:  Shoulder length brown hair.  Normocephalic, atraumatic, face symmetric, no Cushingoid features. EYES: Blue eyes. Pupils equal round and reactive to light and accomodation. No conjunctivitis or scleral icterus. ENT: Oropharynx clear without lesion. Gums unremarkable. Tongue normal. Mucous membranes moist.  RESPIRATORY: Clear to auscultation without rales, wheezes or rhonchi. CARDIOVASCULAR: Regular rate and rhythm without murmur, rub or  gallop. ABDOMEN: Soft, non-tender, with active bowel sounds, and no appreciable hepatosplenomegaly. No masses. SKIN: No rashes, ulcers or lesions. EXTREMITIES: No edema, no skin discoloration or tenderness. No palpable cords. LYMPH NODES: No palpable cervical, supraclavicular, axillary or inguinal adenopathy  NEUROLOGICAL: Unremarkable. PSYCH: Appropriate.   Appointment on 07/08/2015  Component Date Value Ref Range Status  . Total Protein 07/08/2015 7.4  6.5 - 8.1 g/dL Final  . Albumin 07/08/2015 3.9  3.5 - 5.0 g/dL Final  . AST 07/08/2015 23  15 - 41 U/L Final  . ALT 07/08/2015 22  14 - 54 U/L Final  . Alkaline Phosphatase 07/08/2015 111  38 - 126 U/L Final  .  Total Bilirubin 07/08/2015 0.6  0.3 - 1.2 mg/dL Final  . Bilirubin, Direct 07/08/2015 <0.1* 0.1 - 0.5 mg/dL Final  . Indirect Bilirubin 07/08/2015 NOT CALCULATED  0.3 - 0.9 mg/dL Final    Assessment:  BRYNNA DOBOS is a 50 y.o. female with a history of stage I right breast cancer status post wide local incision on 08/12/2012.  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  Sentinel lymph node was negative.  Tumor was ER/PR positive and Her2/neu negative.  Oncotype DX score was 14 (low risk).  She completed breast radiation in 10/2012.  She has been on tamoxifen since 10/2012.  Bilateral mammogram on 09/02/2013 revealed no suspicious findings.  Right sided mammogram on 04/03/2014 revealed no suspicious findings.  The patient had a bone density study 2 years ago (unknown results).  She discontinued tamoxifen on 10/19/2014 following a submassive PE.  She had a bilateral submassive pulmonary emboli on 10/18/2014.  Chest CT angiogram revealed submassive PE with right heart strain.  She received catheter directed lytic therapy at [facility].  Lower extremity duplex was negative for DVT.  She is on Xarelto.    Hypercoagulable work-up revealed the following normal studies:  Factor V Leiden, prothrombin gene  mutation, lupus anticoagulant panel, anticardiolipin antibodies, protein S total (127%), protein S activity (99%), protein C total (71%), protein C activity (77%), and ATIII activity (82%).  Bone density study on 01/26/2015 revealed a T score of -0.3 (normal).  She has had no menses since 07/2012.  Labs on 01/18/2015 confirmed a post-menopausal status.  She began Femara on 06/10/2015.  She was noted to have stage III renal insufficiency on 06/10/2015.  Creatinine was 1.34 (CrCl 45 ml/min) with prior baseline Cr of 0.86 - 0.93.  She has been off Xarelto for 2 weeks secondary to oral bleeding.  She has a mild anemia.  Symptomatically, she feels good.  Exam is stable.  Plan: 1.  Labs today:  CMP, ferritin, iron studies, B12, folate, SPEP. 2.  Guaiac cards x 3. 3.  Discuss renal insufficiency and anticoagulation.  Consider switch to Eliquis.  Discuss need for anticoagulation given sub-massive PE and hormonal therapy. 4.  Referral to nephrology. 5.  Discuss health maintenance and no prior colonoscopy.   6.  Rx:  Eliquis 5 mg po BID. 7.  Continue Femara if on anticoagulation. 8.  RTC in 1 month for MD assessment and labs (CBC with diff, CMP).  Addendum:  Xarelto does not need adjustment unless CrCl <= 30 ml/min.  Eliquis does not need to be adjusted if Cr < 1.5.  Serum creatinine > 2.5 or CrCl < 25 ml/min were excluded from clinical trials.   Lequita Asal, MD  07/08/2015, 3:50 PM

## 2015-07-08 NOTE — Progress Notes (Signed)
Pt reports that she would wake with a taste of blood in her mouth and would spit blood and saw PCP and he removed her from her Ozzie Hoyle about two weeks ago and took labs.  PCP would like Dr. Loletha Grayer to change medcation if needed.  Pt reports no trouble with Femara

## 2015-07-09 ENCOUNTER — Telehealth: Payer: Self-pay

## 2015-07-09 LAB — PROTEIN ELECTROPHORESIS, SERUM
A/G Ratio: 0.9 (ref 0.7–1.7)
Albumin ELP: 3.4 g/dL (ref 2.9–4.4)
Alpha-1-Globulin: 0.3 g/dL (ref 0.0–0.4)
Alpha-2-Globulin: 1.2 g/dL — ABNORMAL HIGH (ref 0.4–1.0)
Beta Globulin: 1.3 g/dL (ref 0.7–1.3)
Gamma Globulin: 1 g/dL (ref 0.4–1.8)
Globulin, Total: 3.7 g/dL (ref 2.2–3.9)
Total Protein ELP: 7.1 g/dL (ref 6.0–8.5)

## 2015-07-09 LAB — FOLATE: Folate: 68.4 ng/mL (ref >5.9)

## 2015-07-09 NOTE — Telephone Encounter (Signed)
Called pt per MD.  To notify of IDA.  No answer left VM for pt to return my call.

## 2015-07-09 NOTE — Telephone Encounter (Signed)
Called and spoke with pt per MD pt to Begin ferrous sulfate 325 mg po q day with OJ or vitamin C.  Also for pt to do guaiac cards.  Per pt she has already has begun them.  Pt verbalizes an understanding and discussed Iron rich foods. No other concerns noted.

## 2015-07-10 ENCOUNTER — Encounter: Payer: Self-pay | Admitting: Hematology and Oncology

## 2015-07-10 DIAGNOSIS — C50911 Malignant neoplasm of unspecified site of right female breast: Secondary | ICD-10-CM | POA: Diagnosis not present

## 2015-07-12 DIAGNOSIS — C50911 Malignant neoplasm of unspecified site of right female breast: Secondary | ICD-10-CM | POA: Diagnosis not present

## 2015-07-14 ENCOUNTER — Other Ambulatory Visit: Payer: Self-pay | Admitting: *Deleted

## 2015-07-14 DIAGNOSIS — C50911 Malignant neoplasm of unspecified site of right female breast: Secondary | ICD-10-CM

## 2015-07-14 DIAGNOSIS — I2782 Chronic pulmonary embolism: Secondary | ICD-10-CM

## 2015-07-14 LAB — OCCULT BLOOD X 1 CARD TO LAB, STOOL
Fecal Occult Bld: NEGATIVE
Fecal Occult Bld: NEGATIVE
Fecal Occult Bld: NEGATIVE

## 2015-07-26 ENCOUNTER — Other Ambulatory Visit: Payer: Self-pay | Admitting: *Deleted

## 2015-07-26 MED ORDER — LETROZOLE 2.5 MG PO TABS: 2.5000 mg | ORAL_TABLET | Freq: Every day | ORAL | Status: DC

## 2015-07-26 MED ORDER — APIXABAN 5 MG PO TABS: 5.0000 mg | ORAL_TABLET | Freq: Two times a day (BID) | ORAL | Status: DC

## 2015-08-09 ENCOUNTER — Encounter: Payer: Self-pay | Admitting: Hematology and Oncology

## 2015-08-09 ENCOUNTER — Inpatient Hospital Stay: Payer: BLUE CROSS/BLUE SHIELD | Attending: Hematology and Oncology

## 2015-08-09 ENCOUNTER — Inpatient Hospital Stay (HOSPITAL_BASED_OUTPATIENT_CLINIC_OR_DEPARTMENT_OTHER): Payer: BLUE CROSS/BLUE SHIELD | Admitting: Hematology and Oncology

## 2015-08-09 VITALS — BP 128/73 | HR 73 | Temp 98.4°F | Resp 16 | Ht 62.0 in | Wt 326.4 lb

## 2015-08-09 DIAGNOSIS — D509 Iron deficiency anemia, unspecified: Secondary | ICD-10-CM

## 2015-08-09 DIAGNOSIS — E119 Type 2 diabetes mellitus without complications: Secondary | ICD-10-CM | POA: Diagnosis not present

## 2015-08-09 DIAGNOSIS — Z79899 Other long term (current) drug therapy: Secondary | ICD-10-CM

## 2015-08-09 DIAGNOSIS — Z79811 Long term (current) use of aromatase inhibitors: Secondary | ICD-10-CM | POA: Insufficient documentation

## 2015-08-09 DIAGNOSIS — N289 Disorder of kidney and ureter, unspecified: Secondary | ICD-10-CM

## 2015-08-09 DIAGNOSIS — C50211 Malignant neoplasm of upper-inner quadrant of right female breast: Secondary | ICD-10-CM | POA: Diagnosis not present

## 2015-08-09 DIAGNOSIS — E860 Dehydration: Secondary | ICD-10-CM | POA: Diagnosis not present

## 2015-08-09 DIAGNOSIS — Z7901 Long term (current) use of anticoagulants: Secondary | ICD-10-CM | POA: Insufficient documentation

## 2015-08-09 DIAGNOSIS — Z86711 Personal history of pulmonary embolism: Secondary | ICD-10-CM | POA: Diagnosis not present

## 2015-08-09 DIAGNOSIS — Z170 Estrogen receptor positive status [ER+]: Secondary | ICD-10-CM | POA: Diagnosis not present

## 2015-08-09 DIAGNOSIS — M1990 Unspecified osteoarthritis, unspecified site: Secondary | ICD-10-CM | POA: Insufficient documentation

## 2015-08-09 DIAGNOSIS — M109 Gout, unspecified: Secondary | ICD-10-CM | POA: Insufficient documentation

## 2015-08-09 DIAGNOSIS — I2782 Chronic pulmonary embolism: Secondary | ICD-10-CM

## 2015-08-09 DIAGNOSIS — C50911 Malignant neoplasm of unspecified site of right female breast: Secondary | ICD-10-CM

## 2015-08-09 DIAGNOSIS — I10 Essential (primary) hypertension: Secondary | ICD-10-CM | POA: Diagnosis not present

## 2015-08-09 LAB — CBC WITH DIFFERENTIAL/PLATELET
Basophils Absolute: 0.1 10*3/uL (ref 0–0.1)
Basophils Relative: 1 %
Eosinophils Absolute: 0.2 10*3/uL (ref 0–0.7)
Eosinophils Relative: 2 %
HCT: 34.1 % — ABNORMAL LOW (ref 35.0–47.0)
Hemoglobin: 10.9 g/dL — ABNORMAL LOW (ref 12.0–16.0)
Lymphocytes Relative: 10 %
Lymphs Abs: 1.2 10*3/uL (ref 1.0–3.6)
MCH: 26.9 pg (ref 26.0–34.0)
MCHC: 32 g/dL (ref 32.0–36.0)
MCV: 84 fL (ref 80.0–100.0)
Monocytes Absolute: 0.6 10*3/uL (ref 0.2–0.9)
Monocytes Relative: 5 %
Neutro Abs: 10.6 10*3/uL — ABNORMAL HIGH (ref 1.4–6.5)
Neutrophils Relative %: 82 %
Platelets: 212 10*3/uL (ref 150–440)
RBC: 4.06 MIL/uL (ref 3.80–5.20)
RDW: 18.2 % — ABNORMAL HIGH (ref 11.5–14.5)
WBC: 12.8 10*3/uL — ABNORMAL HIGH (ref 3.6–11.0)

## 2015-08-09 NOTE — Progress Notes (Signed)
[facility] day:  08/09/2015     Chief Complaint: Nicole Schmidt is a 50 y.o. female with stage I breast cancer, history of submassive pulmonary embolism, and iron deficiency anemia who is seen for 1 month assessment on Femara.  HPI: The patient was last seen in the medical oncology clinic on 07/08/2015.  At that time, she felt good.  Exam was stable. She had been off Xarelto for 2 weeks secondary to oral bleeding.  She had a mild anemia.  Decision was made to switch to Eliquis (5 mg BID).  Labs at last viist revealed included a ferritin of 25, iron saturation 6%, TIBC 473 (high), B12 315. Folate 68.4.  SPEP revealed no monoclonal protein. LFTS were normal.   Guaiac cards x 3 were negative. Creatinine was 1.34 (CrCl 45 ml/min) on 06/10/2015.  She was seen by Dr. Zollie Scale for her renal insufficiency. States that there was felt like she had dehydration. Renal ultrasound was performed (no results available) sees.  Patient is taking ferrous sulfate 325 mg a day with orange juice. She is eating meat. She denies any bleeding.   Past Medical History  Diagnosis Date  . Arthritis   . Menopause     age 44  . Hypertension   . Gout   . Thyroid goiter   . Breast cancer of upper-inner quadrant of right female breast ([place])     Right breast invasive CA and DCIS , T1,N0,M0. Er/PR pos, her 2 negative  . Diabetes mellitus without complication ([place])   . Renal insufficiency   . Anemia   . Gout     Past Surgical History  Procedure Laterality Date  . Breast surgery Right 2014    with sentinel node bx subareaolar duct excision  . Breast biopsy Right 2014    Family History  Problem Relation Age of Onset  . Diabetes Father   . Hypertension Father   . Parkinson's disease Father   . Hypertension Mother   . Rheum arthritis Mother   . Atrial fibrillation Mother   . Heart failure Mother     Social History:  reports that she has never smoked. She has never  used smokeless tobacco. She reports that she does not drink alcohol or use illicit drugs.  Her mother died of a MI.  Her father died in 06/06/2015.  He had Parkinson's disease and dementia.  She is an only child.  She has never married.  The patient is alone today.  Allergies: No Known Allergies  Current Medications: Current Outpatient Prescriptions  Medication Sig Dispense Refill  . allopurinol (ZYLOPRIM) 300 MG tablet     . apixaban (ELIQUIS) 5 MG TABS tablet Take 1 tablet (5 mg total) by mouth 2 (two) times daily. 180 tablet 1  . ARMOUR THYROID 60 MG tablet Take 2 tablets by mouth daily.    Marland Kitchen atorvastatin (LIPITOR) 20 MG tablet Take 20 mg by mouth daily.    . bromocriptine (PARLODEL) 2.5 MG tablet Take 2.5 mg by mouth.    . Calcium Carbonate-Vitamin D (CALCIUM 600 + D PO) Take 1 tablet by mouth daily.    Marland Kitchen CINNAMON PO Take 2,000 mg by mouth daily.    . Dulaglutide (TRULICITY) 1.5 GY/5.6LS SOPN Inject into the skin once a week.    . ergocalciferol (VITAMIN D2) 50000 UNITS capsule Take 50,000 Units by mouth once a week. Take on Fridays.    . ferrous sulfate 325 (65 FE) MG  tablet Take 325 mg by mouth daily.    . folic acid (FOLVITE) 468 MCG tablet Take 800 mcg by mouth daily.     . furosemide (LASIX) 20 MG tablet Take 20 mg by mouth.    . letrozole (FEMARA) 2.5 MG tablet Take 1 tablet (2.5 mg total) by mouth daily. 90 tablet 1  . lisinopril-hydrochlorothiazide (PRINZIDE,ZESTORETIC) 20-25 MG per tablet Take 1 tablet by mouth daily.    . metFORMIN (GLUCOPHAGE) 1000 MG tablet Take 1 tablet by mouth 2 (two) times daily.    Marland Kitchen NOVOFINE 30G X 8 MM MISC     . nystatin (MYCOSTATIN/NYSTOP) 100000 UNIT/GM POWD Apply topically to rash 3 times a day 30 g 1  . ONE TOUCH ULTRA TEST test strip     . ONETOUCH DELICA LANCETS FINE MISC      No current facility-administered medications for this visit.    Review of Systems:  GENERAL:  Feels good.  No fevers or sweats.  Weight down 2 pounds. PERFORMANCE  STATUS (ECOG):  0 HEENT: No visual changes, runny nose, sore throat, mouth sores or tenderness. Lungs: No increased shortness of breath.  No cough.  No hemoptysis. Cardiac:  No chest pain, palpitations, orthopnea, or PND. GI:  No nausea, vomiting, diarrhea, constipation, melena or hematochezia. GU:  No urgency, frequency, dysuria, or hematuria. Musculoskeletal:  No back pain.  No joint pain.  No muscle tenderness. Extremities:  No pain or swelling. Skin:  No rashes or skin changes. Neuro:  No headache, numbness or weakness, balance or coordination issues. Endocrine: Diabetes.  No thyroid issues, hot flashes or night sweats. Psych:  No mood changes, depression or anxiety. Pain:  No focal pain. Review of systems:  All other systems reviewed and found to be negative.  Physical Exam: Blood pressure 128/73, pulse 73, temperature 98.4 F (36.9 C), temperature source Tympanic, resp. rate 16, height 5' 2" (1.575 m), weight 326 lb 6.3 oz (148.05 kg), last menstrual period 07/17/2012. GENERAL:  Well developed, well nourished, heavy set woman sitting comfortably in the exam room in no acute distress. MENTAL STATUS:  Alert and oriented to person, place and time. HEAD:  Shoulder length brown hair.  Normocephalic, atraumatic, face symmetric, no Cushingoid features. EYES: Blue eyes. Pupils equal round and reactive to light and accomodation. No conjunctivitis or scleral icterus. ENT: Oropharynx clear without lesion. Gums unremarkable. Tongue normal. Mucous membranes moist.  RESPIRATORY: Clear to auscultation without rales, wheezes or rhonchi. CARDIOVASCULAR: Regular rate and rhythm without murmur, rub or gallop. ABDOMEN: Soft, non-tender, with active bowel sounds, and no appreciable hepatosplenomegaly. No masses. SKIN: No rashes, ulcers or lesions. EXTREMITIES: No edema, no skin discoloration or tenderness. No palpable cords. LYMPH NODES: No palpable cervical, supraclavicular, axillary or  inguinal adenopathy  NEUROLOGICAL: Unremarkable. PSYCH: Appropriate.   Appointment on 08/09/2015  Component Date Value Ref Range Status  . WBC 08/09/2015 12.8* 3.6 - 11.0 K/uL Final  . RBC 08/09/2015 4.06  3.80 - 5.20 MIL/uL Final  . Hemoglobin 08/09/2015 10.9* 12.0 - 16.0 g/dL Final  . HCT 08/09/2015 34.1* 35.0 - 47.0 % Final  . MCV 08/09/2015 84.0  80.0 - 100.0 fL Final  . MCH 08/09/2015 26.9  26.0 - 34.0 pg Final  . MCHC 08/09/2015 32.0  32.0 - 36.0 g/dL Final  . RDW 08/09/2015 18.2* 11.5 - 14.5 % Final  . Platelets 08/09/2015 212  150 - 440 K/uL Final  . Neutrophils Relative % 08/09/2015 82   Final  . Neutro Abs 08/09/2015  10.6* 1.4 - 6.5 K/uL Final  . Lymphocytes Relative 08/09/2015 10   Final  . Lymphs Abs 08/09/2015 1.2  1.0 - 3.6 K/uL Final  . Monocytes Relative 08/09/2015 5   Final  . Monocytes Absolute 08/09/2015 0.6  0.2 - 0.9 K/uL Final  . Eosinophils Relative 08/09/2015 2   Final  . Eosinophils Absolute 08/09/2015 0.2  0 - 0.7 K/uL Final  . Basophils Relative 08/09/2015 1   Final  . Basophils Absolute 08/09/2015 0.1  0 - 0.1 K/uL Final    Assessment:  DAVETTE NUGENT is a 50 y.o. female with a history of stage I right breast cancer status post wide local incision on 08/12/2012.  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  Sentinel lymph node was negative.  Tumor was ER/PR positive and Her2/neu negative.  Oncotype DX score was 14 (low risk).  She completed breast radiation in 10/2012.  She has been on tamoxifen since 10/2012.  Bilateral mammogram on 09/02/2013 revealed no suspicious findings.  Right sided mammogram on 04/03/2014 revealed no suspicious findings.  The patient had a bone density study 2 years ago (unknown results).  She discontinued tamoxifen on 10/19/2014 following a submassive PE.  She had a bilateral submassive pulmonary emboli on 10/18/2014.  Chest CT angiogram revealed submassive PE with right heart strain.  She received catheter  directed lytic [facility].  Lower extremity duplex was negative for DVT.  She was on Xarelto (discontinued secondary to oral bleeding).  She began Eliquis on 07/08/2015.  Hypercoagulable work-up revealed the following normal studies:  Factor V Leiden, prothrombin gene mutation, lupus anticoagulant panel, anticardiolipin antibodies, protein S total (127%), protein S activity (99%), protein C total (71%), protein C activity (77%), and ATIII activity (82%).  Bone density study on 01/26/2015 revealed a T score of -0.3 (normal).  She has had no menses since 07/2012.  Labs on 01/18/2015 confirmed a post-menopausal status.  She began Femara on 06/10/2015.  She was noted to have a normocytic anemia on 06/10/2015.  Labs on 07/08/2015 revealed documented iron deficiency.  Ferritin was 25, iron saturation 6%, TIBC 473 (high).  Normal labs included B12, folate, SPEP revealed, LFTs, and guaiac cards x 3.   She was noted to have stage III renal insufficiency on 06/10/2015.  Creatinine was 1.34 (CrCl 45 ml/min) with prior baseline Cr of 0.86 - 0.93. She was seen by nephrology.  Etiology was felt secondary to dehydration.  Creatinine is 0.96 today.  Symptomatically, she feels good.  Exam is stable.  Plan: 1.  Labs today:  CBC with diff, CMP. 2.  Discuss work-up at last visit.  Discuss diet and oral iron.  Encourage colonoscopy - patient notes "not found of".   3.  Discuss renal insufficiency, resolved.  No intervention needed. 4.  Continue Eliquis.  Discuss need for anticoagulation given sub-massive PE and hormonal therapy. 5.  Continue oral iron.  Goal ferritin 100. 6.  Continue Femara. 7.  RTC in 3 months for MD assessment and labs (CBC with diff, CMP, ferritin, CA27.29).  Addendum:  Xarelto does not need adjustment unless CrCl <= 30 ml/min.  Eliquis does not need to be adjusted if Cr < 1.5.  Serum creatinine > 2.5 or CrCl < 25 ml/min were excluded from clinical trials.   Lequita Asal, MD   08/09/2015, 3:59 PM

## 2015-08-10 ENCOUNTER — Telehealth: Payer: Self-pay | Admitting: *Deleted

## 2015-08-10 DIAGNOSIS — N179 Acute kidney failure, unspecified: Secondary | ICD-10-CM

## 2015-08-10 DIAGNOSIS — N183 Chronic kidney disease, stage 3 unspecified: Secondary | ICD-10-CM

## 2015-08-10 DIAGNOSIS — C50911 Malignant neoplasm of unspecified site of right female breast: Secondary | ICD-10-CM

## 2015-08-12 NOTE — Telephone Encounter (Signed)
Pt called back and said that she would come Friday 10:30 for lab only and I offered her gas cards and if she would ask front desk for them it will be in envelope with her name on it. And sent message for lab only 10:30 on this Friday.

## 2015-08-13 ENCOUNTER — Inpatient Hospital Stay: Payer: BLUE CROSS/BLUE SHIELD

## 2015-08-13 DIAGNOSIS — C50211 Malignant neoplasm of upper-inner quadrant of right female breast: Secondary | ICD-10-CM | POA: Diagnosis not present

## 2015-08-13 DIAGNOSIS — C50911 Malignant neoplasm of unspecified site of right female breast: Secondary | ICD-10-CM

## 2015-08-13 DIAGNOSIS — D509 Iron deficiency anemia, unspecified: Secondary | ICD-10-CM

## 2015-08-13 LAB — CBC WITH DIFFERENTIAL/PLATELET
Basophils Absolute: 0.1 10*3/uL (ref 0–0.1)
Basophils Relative: 1 %
Eosinophils Absolute: 0.5 10*3/uL (ref 0–0.7)
Eosinophils Relative: 4 %
HCT: 36 % (ref 35.0–47.0)
Hemoglobin: 11.5 g/dL — ABNORMAL LOW (ref 12.0–16.0)
Lymphocytes Relative: 21 %
Lymphs Abs: 2.2 10*3/uL (ref 1.0–3.6)
MCH: 26.9 pg (ref 26.0–34.0)
MCHC: 31.9 g/dL — ABNORMAL LOW (ref 32.0–36.0)
MCV: 84.5 fL (ref 80.0–100.0)
Monocytes Absolute: 0.8 10*3/uL (ref 0.2–0.9)
Monocytes Relative: 7 %
Neutro Abs: 7 10*3/uL — ABNORMAL HIGH (ref 1.4–6.5)
Neutrophils Relative %: 67 %
Platelets: 212 10*3/uL (ref 150–440)
RBC: 4.25 MIL/uL (ref 3.80–5.20)
RDW: 18 % — ABNORMAL HIGH (ref 11.5–14.5)
WBC: 10.5 10*3/uL (ref 3.6–11.0)

## 2015-08-13 LAB — COMPREHENSIVE METABOLIC PANEL
ALT: 19 U/L (ref 14–54)
AST: 18 U/L (ref 15–41)
Albumin: 3.9 g/dL (ref 3.5–5.0)
Alkaline Phosphatase: 133 U/L — ABNORMAL HIGH (ref 38–126)
Anion gap: 6 (ref 5–15)
BUN: 35 mg/dL — ABNORMAL HIGH (ref 6–20)
CO2: 30 mmol/L (ref 22–32)
Calcium: 9.3 mg/dL (ref 8.9–10.3)
Chloride: 104 mmol/L (ref 101–111)
Creatinine, Ser: 0.96 mg/dL (ref 0.44–1.00)
GFR calc Af Amer: >60 mL/min (ref >60)
GFR calc non Af Amer: >60 mL/min (ref >60)
Glucose, Bld: 93 mg/dL (ref 65–99)
Potassium: 4.2 mmol/L (ref 3.5–5.1)
Sodium: 140 mmol/L (ref 135–145)
Total Bilirubin: 0.5 mg/dL (ref 0.3–1.2)
Total Protein: 7.8 g/dL (ref 6.5–8.1)

## 2015-08-13 LAB — FERRITIN: Ferritin: 24 ng/mL (ref 11–307)

## 2015-08-14 LAB — CANCER ANTIGEN 27.29: CA 27.29: 22.5 U/mL (ref 0.0–38.6)

## 2015-10-06 ENCOUNTER — Encounter: Payer: Self-pay | Admitting: *Deleted

## 2015-10-11 ENCOUNTER — Encounter: Payer: Self-pay | Admitting: General Surgery

## 2015-10-12 ENCOUNTER — Encounter: Payer: Self-pay | Admitting: General Surgery

## 2015-10-12 ENCOUNTER — Ambulatory Visit (INDEPENDENT_AMBULATORY_CARE_PROVIDER_SITE_OTHER): Payer: BLUE CROSS/BLUE SHIELD | Admitting: General Surgery

## 2015-10-12 VITALS — BP 124/70 | HR 72 | Resp 14 | Ht 62.0 in | Wt 325.0 lb

## 2015-10-12 DIAGNOSIS — C50111 Malignant neoplasm of central portion of right female breast: Secondary | ICD-10-CM | POA: Diagnosis not present

## 2015-10-12 DIAGNOSIS — I8393 Asymptomatic varicose veins of bilateral lower extremities: Secondary | ICD-10-CM | POA: Diagnosis not present

## 2015-10-12 DIAGNOSIS — I8311 Varicose veins of right lower extremity with inflammation: Secondary | ICD-10-CM

## 2015-10-12 DIAGNOSIS — I8312 Varicose veins of left lower extremity with inflammation: Secondary | ICD-10-CM

## 2015-10-12 DIAGNOSIS — I872 Venous insufficiency (chronic) (peripheral): Secondary | ICD-10-CM

## 2015-10-12 NOTE — Progress Notes (Signed)
Patient ID: Nicole Schmidt, female   DOB: 1966-02-10, 50 y.o.   MRN: [id number]  Chief Complaint  Patient presents with  . Follow-up    mammogram    HPI Nicole Schmidt is a 50 y.o. female.  who presents for her follow up breast cancer and a breast evaluation. The most recent mammogram was done on 10-08-15.  Patient does perform regular self breast checks and gets regular mammograms done.   No new breast issues. She did develop a pulmonary embolus about 1-2 months after starting tamoxifen last year and she was changed to letrozole. I have reviewed the history of present illness with the patient.  HPI  Past Medical History:  Diagnosis Date  . Anemia   . Arthritis   . Breast cancer of upper-inner quadrant of right female breast ([place])    Right breast invasive CA and DCIS , T1,N0,M0. Er/PR pos, her 2 negative  . Diabetes mellitus without complication ([place])   . Gout   . Gout   . Hypertension   . Menopause    age 86  . Renal insufficiency   . Thyroid goiter     Past Surgical History:  Procedure Laterality Date  . BREAST BIOPSY Right 2014  . BREAST SURGERY Right 2014   with sentinel node bx subareaolar duct excision    Family History  Problem Relation Age of Onset  . Diabetes Father   . Hypertension Father   . Parkinson's disease Father   . Hypertension Mother   . Rheum arthritis Mother   . Atrial fibrillation Mother   . Heart failure Mother     Social History Social History  Substance Use Topics  . Smoking status: Never Smoker  . Smokeless tobacco: Never Used  . Alcohol use No    No Known Allergies  Current Outpatient Prescriptions  Medication Sig Dispense Refill  . allopurinol (ZYLOPRIM) 300 MG tablet     . apixaban (ELIQUIS) 5 MG TABS tablet Take 1 tablet (5 mg total) by mouth 2 (two) times daily. 180 tablet 1  . ARMOUR THYROID 60 MG tablet Take 2 tablets by mouth daily.    Marland Kitchen atorvastatin (LIPITOR) 20 MG tablet Take 20 mg by mouth daily.    . Calcium  Carbonate-Vitamin D (CALCIUM 600 + D PO) Take 1 tablet by mouth daily.    Marland Kitchen CINNAMON PO Take 2,000 mg by mouth daily.    . Dulaglutide (TRULICITY) 1.5 0000000 SOPN Inject into the skin once a week.    . ergocalciferol (VITAMIN D2) 50000 UNITS capsule Take 50,000 Units by mouth once a week. Take on Fridays.    . ferrous sulfate 325 (65 FE) MG tablet Take 325 mg by mouth daily.    . folic acid (FOLVITE) Q000111Q MCG tablet Take 800 mcg by mouth daily.     . furosemide (LASIX) 20 MG tablet Take 20 mg by mouth.    . letrozole (FEMARA) 2.5 MG tablet Take 1 tablet (2.5 mg total) by mouth daily. 90 tablet 1  . lisinopril-hydrochlorothiazide (PRINZIDE,ZESTORETIC) 20-25 MG per tablet Take 1 tablet by mouth daily.    . metFORMIN (GLUCOPHAGE) 1000 MG tablet Take 1 tablet by mouth 2 (two) times daily.    Marland Kitchen NOVOFINE 30G X 8 MM MISC     . nystatin (MYCOSTATIN/NYSTOP) 100000 UNIT/GM POWD Apply topically to rash 3 times a day 30 g 1  . ONE TOUCH ULTRA TEST test strip     . [place]  No current facility-administered medications for this visit.     Review of Systems Review of Systems  Blood pressure 124/70, pulse 72, resp. rate 14, height 5\' 2"  (1.575 m), weight (!) 325 lb (147.4 kg), last menstrual period 07/17/2012.  Physical Exam Physical Exam  Constitutional: She is oriented to person, place, and time. She appears well-developed and well-nourished.  HENT:  Mouth/Throat: Oropharynx is clear and moist.  Eyes: Conjunctivae are normal. No scleral icterus.  Neck: Neck supple.  Cardiovascular: Normal rate, regular rhythm and normal heart sounds.   Mild lower leg stasis changes. Varicose veins  Pulmonary/Chest: Effort normal and breath sounds normal. Right breast exhibits no inverted nipple, no mass, no nipple discharge, no skin change and no tenderness. Left breast exhibits no inverted nipple, no mass, no nipple discharge, no skin change and no tenderness.  Right breast  lumpectomy site retro areolar and looks well healed and no palpable abnormality.   Abdominal: Soft. There is no tenderness.  Lymphadenopathy:    She has no cervical adenopathy.    She has no axillary adenopathy.  Neurological: She is alert and oriented to person, place, and time.  Skin: Skin is warm and dry.  Psychiatric: Her behavior is normal.    Data Reviewed Mammogram reviewed and stable  Assessment    Stable physical exam. Pt is 3 years post right breast lumpectomy, sentinel node biopsy and radiation for stage 1 CA. Currently on letrozole and doing well.   Varicose veins Stasis dermatitis  Plan    Patient to have a bilateral diagnostic mammogram follow up in one year.  Continue self breast exams. Call office for any new breast issues or concerns   Recommend mild support hose and utter cream for her lower legs.  This information has been scribed by Karie Fetch RN, BSN,BC.   SANKAR,SEEPLAPUTHUR G 10/12/2015, 5:36 PM

## 2015-10-12 NOTE — Patient Instructions (Addendum)
Patient to have a bilateral diagnostic mammogram follow up in one year.  Continue self breast exams. Call office for any new breast issues or concerns      Recommend mild support hose and utter cream for her lower legs.

## 2015-11-04 ENCOUNTER — Other Ambulatory Visit: Payer: Self-pay | Admitting: *Deleted

## 2015-11-04 DIAGNOSIS — C50919 Malignant neoplasm of unspecified site of unspecified female breast: Secondary | ICD-10-CM

## 2015-11-07 NOTE — Progress Notes (Signed)
[facility] day:  11/08/15     Chief Complaint: Nicole Schmidt is a 50 y.o. female with stage I breast cancer, history of submassive pulmonary embolism, and iron deficiency anemia who is seen for 3 month assessment on Femara.  HPI: The patient was last seen in the medical oncology clinic on 08/09/2015.  At that time, she felt good.  Exam was stable.  We discussed continuation of Eliquis given her sub-massive PE on tamoxifen.  She was to continue oral iron until her ferritin was 100.  She was on Femara.   Symptomatically, she denies any new complaints.     Past Medical History:  Diagnosis Date  . Anemia   . Arthritis   . Breast cancer of upper-inner quadrant of right female breast ([place])    Right breast invasive CA and DCIS , T1,N0,M0. Er/PR pos, her 2 negative  . Diabetes mellitus without complication ([place])   . Gout   . Gout   . Hypertension   . Menopause    age 59  . Renal insufficiency   . Thyroid goiter     Past Surgical History:  Procedure Laterality Date  . BREAST BIOPSY Right 2014  . BREAST SURGERY Right 2014   with sentinel node bx subareaolar duct excision    Family History  Problem Relation Age of Onset  . Diabetes Father   . Hypertension Father   . Parkinson's disease Father   . Hypertension Mother   . Rheum arthritis Mother   . Atrial fibrillation Mother   . Heart failure Mother     Social History:  reports that she has never smoked. She has never used smokeless tobacco. She reports that she does not drink alcohol or use drugs.  Her mother died of a MI.  Her father died in May 09, 2015.  He had Parkinson's disease and dementia.  She is an only child.  She has never married.  The patient is alone today.  Allergies: No Known Allergies  Current Medications: Current Outpatient Prescriptions  Medication Sig Dispense Refill  . allopurinol (ZYLOPRIM) 300 MG tablet Take 300 mg by mouth daily.     Marland Kitchen apixaban (ELIQUIS) 5 MG  TABS tablet Take 1 tablet (5 mg total) by mouth 2 (two) times daily. 180 tablet 1  . ARMOUR THYROID 60 MG tablet Take 2 tablets by mouth daily.    . Ascorbic Acid (VITAMIN C PO) Take by mouth.    Marland Kitchen atorvastatin (LIPITOR) 20 MG tablet Take 20 mg by mouth daily.    . Calcium Carbonate-Vitamin D (CALCIUM 600 + D PO) Take 1 tablet by mouth daily.    Marland Kitchen CINNAMON PO Take 2,000 mg by mouth daily.    . Dulaglutide (TRULICITY) 1.5 CL/2.7NT SOPN Inject into the skin once a week.    . ergocalciferol (VITAMIN D2) 50000 UNITS capsule Take 50,000 Units by mouth once a week. Take on Fridays.    . ferrous sulfate 325 (65 FE) MG tablet Take 325 mg by mouth daily.    . folic acid (FOLVITE) 700 MCG tablet Take 800 mcg by mouth daily.     . furosemide (LASIX) 20 MG tablet Take 20 mg by mouth.    . letrozole (FEMARA) 2.5 MG tablet Take 1 tablet (2.5 mg total) by mouth daily. 90 tablet 1  . lisinopril-hydrochlorothiazide (PRINZIDE,ZESTORETIC) 20-25 MG per tablet Take 1 tablet by mouth daily.    . metFORMIN (GLUCOPHAGE) 1000 MG tablet Take 1 tablet  by mouth 2 (two) times daily.    . ONE TOUCH ULTRA TEST test strip     . ONETOUCH DELICA LANCETS FINE MISC     . nystatin (MYCOSTATIN/NYSTOP) 100000 UNIT/GM POWD Apply topically to rash 3 times a day (Patient not taking: Reported on 11/08/2015) 30 g 1   No current facility-administered medications for this visit.     Review of Systems:  GENERAL:  Feels "ok".  No fevers or sweats.  Weight down 4 pounds. PERFORMANCE STATUS (ECOG):  0 HEENT: No visual changes, runny nose, sore throat, mouth sores or tenderness. Lungs: No increased shortness of breath.  No cough.  No hemoptysis. Cardiac:  No chest pain, palpitations, orthopnea, or PND. GI:  No nausea, vomiting, diarrhea, constipation, melena or hematochezia. GU:  No urgency, frequency, dysuria, or hematuria. Musculoskeletal:  No back pain.  No joint pain.  No muscle tenderness. Extremities:  No pain or swelling. Skin:   No rashes or skin changes. Neuro:  No headache, numbness or weakness, balance or coordination issues. Endocrine: Diabetes.  No thyroid issues, hot flashes or night sweats. Psych:  No mood changes, depression or anxiety. Pain:  No focal pain. Review of systems:  All other systems reviewed and found to be negative.  Physical Exam: Blood pressure 118/88, pulse 99, temperature 98.4 F (36.9 C), temperature source Tympanic, resp. rate 18, weight (!) 322 lb 15.6 oz (146.5 kg), last menstrual period 07/17/2012. GENERAL:  Well developed, well nourished, heavy set woman sitting comfortably in the exam room in no acute distress. MENTAL STATUS:  Alert and oriented to person, place and time. HEAD:  Shoulder length brown hair.  Normocephalic, atraumatic, face symmetric, no Cushingoid features. EYES: Blue eyes. Pupils equal round and reactive to light and accomodation. No conjunctivitis or scleral icterus. ENT: Oropharynx clear without lesion. Gums unremarkable. Tongue normal. Mucous membranes moist.  RESPIRATORY: Clear to auscultation without rales, wheezes or rhonchi. CARDIOVASCULAR: Regular rate and rhythm without murmur, rub or gallop. BREASTS:  Right breast with post operative and post radiation changes.  No discrete masses, skin changes or nipple discharge.  Left breast with fibrocystic changes in the upper outer quadrant.  No discrete masses, skin changes or nipple discharge. ABDOMEN: Soft, non-tender, with active bowel sounds, and no appreciable hepatosplenomegaly. No masses. SKIN: No rashes, ulcers or lesions. EXTREMITIES: No edema, no skin discoloration or tenderness. No palpable cords. LYMPH NODES: No palpable cervical, supraclavicular, axillary or inguinal adenopathy  NEUROLOGICAL: Unremarkable. PSYCH: Appropriate.   Appointment on 11/08/2015  Component Date Value Ref Range Status  . WBC 11/08/2015 13.3* 3.6 - 11.0 K/uL Final  . RBC 11/08/2015 4.27  3.80 - 5.20 MIL/uL Final   . Hemoglobin 11/08/2015 11.7* 12.0 - 16.0 g/dL Final  . HCT 11/08/2015 36.4  35.0 - 47.0 % Final  . MCV 11/08/2015 85.3  80.0 - 100.0 fL Final  . MCH 11/08/2015 27.4  26.0 - 34.0 pg Final  . MCHC 11/08/2015 32.2  32.0 - 36.0 g/dL Final  . RDW 11/08/2015 17.9* 11.5 - 14.5 % Final  . Platelets 11/08/2015 233  150 - 440 K/uL Final  . Neutrophils Relative % 11/08/2015 75  % Final  . Neutro Abs 11/08/2015 10.0* 1.4 - 6.5 K/uL Final  . Lymphocytes Relative 11/08/2015 12  % Final  . Lymphs Abs 11/08/2015 1.6  1.0 - 3.6 K/uL Final  . Monocytes Relative 11/08/2015 8  % Final  . Monocytes Absolute 11/08/2015 1.1* 0.2 - 0.9 K/uL Final  . Eosinophils Relative 11/08/2015  4  % Final  . Eosinophils Absolute 11/08/2015 0.5  0 - 0.7 K/uL Final  . Basophils Relative 11/08/2015 1  % Final  . Basophils Absolute 11/08/2015 0.1  0 - 0.1 K/uL Final  . Sodium 11/08/2015 141  135 - 145 mmol/L Final  . Potassium 11/08/2015 4.2  3.5 - 5.1 mmol/L Final  . Chloride 11/08/2015 102  101 - 111 mmol/L Final  . CO2 11/08/2015 31  22 - 32 mmol/L Final  . Glucose, Bld 11/08/2015 82  65 - 99 mg/dL Final  . BUN 11/08/2015 31* 6 - 20 mg/dL Final  . Creatinine, Ser 11/08/2015 1.02* 0.44 - 1.00 mg/dL Final  . Calcium 11/08/2015 10.1  8.9 - 10.3 mg/dL Final  . Total Protein 11/08/2015 7.8  6.5 - 8.1 g/dL Final  . Albumin 11/08/2015 3.8  3.5 - 5.0 g/dL Final  . AST 11/08/2015 21  15 - 41 U/L Final  . ALT 11/08/2015 19  14 - 54 U/L Final  . Alkaline Phosphatase 11/08/2015 119  38 - 126 U/L Final  . Total Bilirubin 11/08/2015 0.3  0.3 - 1.2 mg/dL Final  . GFR calc non Af Amer 11/08/2015 >60  >60 mL/min Final  . GFR calc Af Amer 11/08/2015 >60  >60 mL/min Final   Comment: (NOTE) The eGFR has been calculated using the CKD EPI equation. This calculation has not been validated in all clinical situations. eGFR's persistently <60 mL/min signify possible Chronic Kidney Disease.   Georgiann Hahn gap 11/08/2015 8  5 - 15 Final     Assessment:  Nicole Schmidt is a 50 y.o. female with a history of stage I right breast cancer status post wide local incision on 08/12/2012.  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  Sentinel lymph node was negative.  Tumor was ER/PR positive and Her2/neu negative.  Oncotype DX score was 14 (low risk).  She completed breast radiation in 10/2012.  She began tamoxifen in 10/2012, but discontinued it on 10/19/2014 following a submassive PE.    Bilateral mammogram on 09/02/2013 revealed no suspicious findings.  Right sided mammogram on 04/03/2014 revealed no suspicious findings.  Bilateral mammogram on 10/08/2015 revealed expected potential distortion, stable compared to prior studies and consistent with postsurgical changes. There were no suspicious mass masses, calcifications, sites of non-surgical architectural distortion or concerning asymmetries  She had a bilateral submassive pulmonary emboli on 10/18/2014.  Chest CT angiogram revealed submassive PE with right heart strain.  She received catheter [facility] l[facility].  Lower extremity duplex was negative for DVT.  She was on Xarelto (discontinued secondary to oral bleeding).  She began Eliquis on 07/08/2015.  Hypercoagulable work-up revealed the following normal studies:  Factor V Leiden, prothrombin gene mutation, lupus anticoagulant panel, anticardiolipin antibodies, protein S total (127%), protein S activity (99%), protein C total (71%), protein C activity (77%), and ATIII activity (82%).  Bone density study on 01/26/2015 was normal with a T score of -0.3.  She has had no menses since 07/2012.  Labs on 01/18/2015 confirmed a post-menopausal status.  She began Femara on 06/10/2015.  She was noted to have a normocytic anemia on 06/10/2015.  Labs on 07/08/2015 revealed documented iron deficiency.  Ferritin was 25, iron saturation 6%, TIBC 473 (high).  Normal labs included B12, folate, SPEP revealed, LFTs,  and guaiac cards x 3.   She was noted to have stage III renal insufficiency on 06/10/2015.  Creatinine was 1.34 (CrCl 45 ml/min) with  prior baseline Cr of 0.86 - 0.93. She was seen by nephrology.  Etiology was felt secondary to dehydration.  Creatinine is 0.96 today.  Symptomatically, she denies any new complaints.  Exam is stable.  Hematocrit is normal.  Ferritin is 30.  Plan: 1.  Labs today:  CBC with diff, CMP, ferritin, CA27.29. 2.  Continue Femara. 3.  Continue Eliquis. 4.  Continue oral iron.  Goal ferritin 100. 5.  RTC in 3 months for MD assessment and labs (CBC with diff, CMP, ferritin, CA27.29).  Addendum:  Xarelto does not need adjustment unless CrCl <= 30 ml/min.  Eliquis does not need to be adjusted if Cr < 1.5.  Serum creatinine > 2.5 or CrCl < 25 ml/min were excluded from clinical trials.   Lequita Asal, MD  11/08/2015, 4:46 PM

## 2015-11-08 ENCOUNTER — Inpatient Hospital Stay: Payer: BLUE CROSS/BLUE SHIELD

## 2015-11-08 ENCOUNTER — Inpatient Hospital Stay: Payer: BLUE CROSS/BLUE SHIELD | Attending: Hematology and Oncology | Admitting: Hematology and Oncology

## 2015-11-08 VITALS — BP 118/88 | HR 99 | Temp 98.4°F | Resp 18 | Wt 323.0 lb

## 2015-11-08 DIAGNOSIS — C50919 Malignant neoplasm of unspecified site of unspecified female breast: Secondary | ICD-10-CM

## 2015-11-08 DIAGNOSIS — I10 Essential (primary) hypertension: Secondary | ICD-10-CM | POA: Diagnosis not present

## 2015-11-08 DIAGNOSIS — Z79811 Long term (current) use of aromatase inhibitors: Secondary | ICD-10-CM | POA: Insufficient documentation

## 2015-11-08 DIAGNOSIS — M1990 Unspecified osteoarthritis, unspecified site: Secondary | ICD-10-CM | POA: Diagnosis not present

## 2015-11-08 DIAGNOSIS — E119 Type 2 diabetes mellitus without complications: Secondary | ICD-10-CM | POA: Diagnosis not present

## 2015-11-08 DIAGNOSIS — M109 Gout, unspecified: Secondary | ICD-10-CM | POA: Diagnosis not present

## 2015-11-08 DIAGNOSIS — E860 Dehydration: Secondary | ICD-10-CM | POA: Diagnosis not present

## 2015-11-08 DIAGNOSIS — Z86711 Personal history of pulmonary embolism: Secondary | ICD-10-CM | POA: Diagnosis not present

## 2015-11-08 DIAGNOSIS — Z7901 Long term (current) use of anticoagulants: Secondary | ICD-10-CM | POA: Diagnosis not present

## 2015-11-08 DIAGNOSIS — N289 Disorder of kidney and ureter, unspecified: Secondary | ICD-10-CM

## 2015-11-08 DIAGNOSIS — I2782 Chronic pulmonary embolism: Secondary | ICD-10-CM

## 2015-11-08 DIAGNOSIS — C50911 Malignant neoplasm of unspecified site of right female breast: Secondary | ICD-10-CM

## 2015-11-08 DIAGNOSIS — Z923 Personal history of irradiation: Secondary | ICD-10-CM | POA: Insufficient documentation

## 2015-11-08 DIAGNOSIS — D509 Iron deficiency anemia, unspecified: Secondary | ICD-10-CM

## 2015-11-08 DIAGNOSIS — Z170 Estrogen receptor positive status [ER+]: Secondary | ICD-10-CM | POA: Diagnosis not present

## 2015-11-08 DIAGNOSIS — C50211 Malignant neoplasm of upper-inner quadrant of right female breast: Secondary | ICD-10-CM | POA: Diagnosis not present

## 2015-11-08 LAB — FERRITIN: Ferritin: 30 ng/mL (ref 11–307)

## 2015-11-08 LAB — CBC WITH DIFFERENTIAL/PLATELET
Basophils Absolute: 0.1 10*3/uL (ref 0–0.1)
Basophils Relative: 1 %
Eosinophils Absolute: 0.5 10*3/uL (ref 0–0.7)
Eosinophils Relative: 4 %
HCT: 36.4 % (ref 35.0–47.0)
Hemoglobin: 11.7 g/dL — ABNORMAL LOW (ref 12.0–16.0)
Lymphocytes Relative: 12 %
Lymphs Abs: 1.6 10*3/uL (ref 1.0–3.6)
MCH: 27.4 pg (ref 26.0–34.0)
MCHC: 32.2 g/dL (ref 32.0–36.0)
MCV: 85.3 fL (ref 80.0–100.0)
Monocytes Absolute: 1.1 10*3/uL — ABNORMAL HIGH (ref 0.2–0.9)
Monocytes Relative: 8 %
Neutro Abs: 10 10*3/uL — ABNORMAL HIGH (ref 1.4–6.5)
Neutrophils Relative %: 75 %
Platelets: 233 10*3/uL (ref 150–440)
RBC: 4.27 MIL/uL (ref 3.80–5.20)
RDW: 17.9 % — ABNORMAL HIGH (ref 11.5–14.5)
WBC: 13.3 10*3/uL — ABNORMAL HIGH (ref 3.6–11.0)

## 2015-11-08 LAB — COMPREHENSIVE METABOLIC PANEL
ALT: 19 U/L (ref 14–54)
AST: 21 U/L (ref 15–41)
Albumin: 3.8 g/dL (ref 3.5–5.0)
Alkaline Phosphatase: 119 U/L (ref 38–126)
Anion gap: 8 (ref 5–15)
BUN: 31 mg/dL — ABNORMAL HIGH (ref 6–20)
CO2: 31 mmol/L (ref 22–32)
Calcium: 10.1 mg/dL (ref 8.9–10.3)
Chloride: 102 mmol/L (ref 101–111)
Creatinine, Ser: 1.02 mg/dL — ABNORMAL HIGH (ref 0.44–1.00)
GFR calc Af Amer: >60 mL/min (ref >60)
GFR calc non Af Amer: >60 mL/min (ref >60)
Glucose, Bld: 82 mg/dL (ref 65–99)
Potassium: 4.2 mmol/L (ref 3.5–5.1)
Sodium: 141 mmol/L (ref 135–145)
Total Bilirubin: 0.3 mg/dL (ref 0.3–1.2)
Total Protein: 7.8 g/dL (ref 6.5–8.1)

## 2015-11-08 NOTE — Progress Notes (Signed)
Patient is here for follow up, doing well no complaints and declines flu shot today.

## 2015-11-09 ENCOUNTER — Other Ambulatory Visit: Payer: Self-pay | Admitting: *Deleted

## 2015-11-09 DIAGNOSIS — C50911 Malignant neoplasm of unspecified site of right female breast: Secondary | ICD-10-CM

## 2015-11-09 LAB — CA 27.29 (SERIAL MONITOR): CA 27.29: 16.6 U/mL (ref 0.0–38.6)

## 2015-11-10 ENCOUNTER — Encounter: Payer: Self-pay | Admitting: Hematology and Oncology

## 2015-11-29 ENCOUNTER — Inpatient Hospital Stay
Admission: RE | Admit: 2015-11-29 | Discharge: 2015-11-29 | Disposition: A | Discharge status: Home / Self Care | Payer: Self-pay | Source: Ambulatory Visit | Attending: Hematology and Oncology | Admitting: Hematology and Oncology

## 2015-11-29 ENCOUNTER — Other Ambulatory Visit: Payer: Self-pay | Admitting: Hematology and Oncology

## 2015-11-29 DIAGNOSIS — Z0000 Encounter for general adult medical examination without abnormal findings: Secondary | ICD-10-CM

## 2015-11-30 ENCOUNTER — Inpatient Hospital Stay
Admission: RE | Admit: 2015-11-30 | Discharge: 2015-11-30 | Disposition: A | Discharge status: Home / Self Care | Payer: Self-pay | Source: Ambulatory Visit | Attending: Hematology and Oncology | Admitting: Hematology and Oncology

## 2015-11-30 ENCOUNTER — Other Ambulatory Visit: Payer: Self-pay | Admitting: Hematology and Oncology

## 2015-11-30 DIAGNOSIS — Z0000 Encounter for general adult medical examination without abnormal findings: Secondary | ICD-10-CM

## 2016-01-04 ENCOUNTER — Other Ambulatory Visit: Payer: Self-pay | Admitting: Hematology and Oncology

## 2016-01-31 ENCOUNTER — Encounter: Payer: Self-pay | Admitting: Hematology and Oncology

## 2016-01-31 ENCOUNTER — Inpatient Hospital Stay (HOSPITAL_BASED_OUTPATIENT_CLINIC_OR_DEPARTMENT_OTHER): Payer: BLUE CROSS/BLUE SHIELD | Admitting: Hematology and Oncology

## 2016-01-31 ENCOUNTER — Inpatient Hospital Stay: Payer: BLUE CROSS/BLUE SHIELD | Attending: Hematology and Oncology

## 2016-01-31 VITALS — BP 153/72 | HR 90 | Temp 99.7°F | Resp 18 | Wt 315.9 lb

## 2016-01-31 DIAGNOSIS — Z923 Personal history of irradiation: Secondary | ICD-10-CM

## 2016-01-31 DIAGNOSIS — Z170 Estrogen receptor positive status [ER+]: Secondary | ICD-10-CM | POA: Diagnosis not present

## 2016-01-31 DIAGNOSIS — Z79811 Long term (current) use of aromatase inhibitors: Secondary | ICD-10-CM | POA: Diagnosis not present

## 2016-01-31 DIAGNOSIS — C50211 Malignant neoplasm of upper-inner quadrant of right female breast: Secondary | ICD-10-CM

## 2016-01-31 DIAGNOSIS — C50911 Malignant neoplasm of unspecified site of right female breast: Secondary | ICD-10-CM

## 2016-01-31 DIAGNOSIS — N289 Disorder of kidney and ureter, unspecified: Secondary | ICD-10-CM | POA: Insufficient documentation

## 2016-01-31 DIAGNOSIS — E119 Type 2 diabetes mellitus without complications: Secondary | ICD-10-CM | POA: Diagnosis not present

## 2016-01-31 DIAGNOSIS — Z86711 Personal history of pulmonary embolism: Secondary | ICD-10-CM

## 2016-01-31 DIAGNOSIS — Z7901 Long term (current) use of anticoagulants: Secondary | ICD-10-CM | POA: Diagnosis not present

## 2016-01-31 DIAGNOSIS — M109 Gout, unspecified: Secondary | ICD-10-CM | POA: Insufficient documentation

## 2016-01-31 DIAGNOSIS — I10 Essential (primary) hypertension: Secondary | ICD-10-CM | POA: Diagnosis not present

## 2016-01-31 DIAGNOSIS — D509 Iron deficiency anemia, unspecified: Secondary | ICD-10-CM

## 2016-01-31 LAB — CBC WITH DIFFERENTIAL/PLATELET
Basophils Absolute: 0 10*3/uL (ref 0–0.1)
Basophils Relative: 0 %
Eosinophils Absolute: 0.5 10*3/uL (ref 0–0.7)
Eosinophils Relative: 4 %
HCT: 37.4 % (ref 35.0–47.0)
Hemoglobin: 12 g/dL (ref 12.0–16.0)
Lymphocytes Relative: 11 %
Lymphs Abs: 1.4 10*3/uL (ref 1.0–3.6)
MCH: 28.2 pg (ref 26.0–34.0)
MCHC: 32.2 g/dL (ref 32.0–36.0)
MCV: 87.5 fL (ref 80.0–100.0)
Monocytes Absolute: 0.9 10*3/uL (ref 0.2–0.9)
Monocytes Relative: 7 %
Neutro Abs: 9.8 10*3/uL — ABNORMAL HIGH (ref 1.4–6.5)
Neutrophils Relative %: 78 %
Platelets: 224 10*3/uL (ref 150–440)
RBC: 4.27 MIL/uL (ref 3.80–5.20)
RDW: 17.4 % — ABNORMAL HIGH (ref 11.5–14.5)
WBC: 12.6 10*3/uL — ABNORMAL HIGH (ref 3.6–11.0)

## 2016-01-31 LAB — COMPREHENSIVE METABOLIC PANEL
ALT: 20 U/L (ref 14–54)
AST: 24 U/L (ref 15–41)
Albumin: 3.8 g/dL (ref 3.5–5.0)
Alkaline Phosphatase: 116 U/L (ref 38–126)
Anion gap: 8 (ref 5–15)
BUN: 35 mg/dL — ABNORMAL HIGH (ref 6–20)
CO2: 30 mmol/L (ref 22–32)
Calcium: 10 mg/dL (ref 8.9–10.3)
Chloride: 101 mmol/L (ref 101–111)
Creatinine, Ser: 1.09 mg/dL — ABNORMAL HIGH (ref 0.44–1.00)
GFR calc Af Amer: >60 mL/min (ref >60)
GFR calc non Af Amer: 57 mL/min — ABNORMAL LOW (ref >60)
Glucose, Bld: 140 mg/dL — ABNORMAL HIGH (ref 65–99)
Potassium: 4.5 mmol/L (ref 3.5–5.1)
Sodium: 139 mmol/L (ref 135–145)
Total Bilirubin: 0.4 mg/dL (ref 0.3–1.2)
Total Protein: 7.8 g/dL (ref 6.5–8.1)

## 2016-01-31 LAB — FERRITIN: Ferritin: 46 ng/mL (ref 11–307)

## 2016-01-31 NOTE — Progress Notes (Signed)
Patient offers no complaints today.  Left FMLA paperwork to be completed and faxed.

## 2016-01-31 NOTE — Progress Notes (Signed)
[place] [facility] day:  01/31/16     Chief Complaint: Nicole Schmidt is a 50 y.o. female with stage I breast cancer, history of submassive pulmonary embolism, and iron deficiency anemia who is seen for 3 month assessment on Femara.  HPI: The patient was last seen in the medical oncology clinic on 11/08/2015.  At that time, she denied any new complaints.  Exam was stable.  Hematocrit was normal.  Ferritin was 30.  Oral iron continued.  She was on Femara.   Symptomatically, she has felt "pretty good".   She denies any breast concerns.  She continues Femara and oral iron.  She remains on Eliquis.  She denies any excess bruising or bleeding.   Past Medical History:  Diagnosis Date  . Anemia   . Arthritis   . Breast cancer of upper-inner quadrant of right female breast ([place])    Right breast invasive CA and DCIS , T1,N0,M0. Er/PR pos, her 2 negative  . Diabetes mellitus without complication ([place])   . Gout   . Gout   . Hypertension   . Menopause    age 8  . Renal insufficiency   . Thyroid goiter     Past Surgical History:  Procedure Laterality Date  . BREAST BIOPSY Right 2014  . BREAST SURGERY Right 2014   with sentinel node bx subareaolar duct excision    Family History  Problem Relation Age of Onset  . Diabetes Father   . Hypertension Father   . Parkinson's disease Father   . Hypertension Mother   . Rheum arthritis Mother   . Atrial fibrillation Mother   . Heart failure Mother     Social History:  reports that she has never smoked. She has never used smokeless tobacco. She reports that she does not drink alcohol or use drugs.  Her mother died of a MI.  Her father died in 05/24/2015.  He had Parkinson's disease and dementia.  She is an only child.  She has never married.  The patient is alone today.  Allergies: No Known Allergies  Current Medications: Current Outpatient Prescriptions  Medication Sig Dispense Refill  . allopurinol  (ZYLOPRIM) 300 MG tablet Take 300 mg by mouth daily.     Francia Greaves THYROID 60 MG tablet Take 2 tablets by mouth daily.    . Ascorbic Acid (VITAMIN C PO) Take by mouth.    Marland Kitchen atorvastatin (LIPITOR) 20 MG tablet Take 20 mg by mouth daily.    . Calcium Carbonate-Vitamin D (CALCIUM 600 + D PO) Take 1 tablet by mouth daily.    Marland Kitchen CINNAMON PO Take 2,000 mg by mouth daily.    . Dulaglutide (TRULICITY) 1.5 WC/5.8NI SOPN Inject into the skin once a week.    Marland Kitchen ELIQUIS 5 MG TABS tablet TAKE 1 TABLET TWICE A DAY 180 tablet 1  . ergocalciferol (VITAMIN D2) 50000 UNITS capsule Take 50,000 Units by mouth once a week. Take on Fridays.    . ferrous sulfate 325 (65 FE) MG tablet Take 325 mg by mouth daily.    . folic acid (FOLVITE) 778 MCG tablet Take 800 mcg by mouth daily.     . furosemide (LASIX) 20 MG tablet Take 20 mg by mouth.    . letrozole (FEMARA) 2.5 MG tablet TAKE 1 TABLET DAILY 90 tablet 1  . lisinopril-hydrochlorothiazide (PRINZIDE,ZESTORETIC) 20-25 MG per tablet Take 1 tablet by mouth daily.    . metFORMIN (GLUCOPHAGE) 1000 MG tablet  Take 1 tablet by mouth 2 (two) times daily.    Marland Kitchen nystatin (MYCOSTATIN/NYSTOP) 100000 UNIT/GM POWD Apply topically to rash 3 times a day (Patient not taking: Reported on 01/31/2016) 30 g 1  . ONE TOUCH ULTRA TEST test strip     . ONETOUCH DELICA LANCETS FINE MISC      No current facility-administered medications for this visit.     Review of Systems:  GENERAL:  Feels "pretty good".  No fevers or sweats.  Weight down 7 pounds. PERFORMANCE STATUS (ECOG):  0 HEENT: No visual changes, runny nose, sore throat, mouth sores or tenderness. Lungs: No increased shortness of breath.  No cough.  No hemoptysis. Cardiac:  No chest pain, palpitations, orthopnea, or PND. GI:  No nausea, vomiting, diarrhea, constipation, melena or hematochezia. GU:  No urgency, frequency, dysuria, or hematuria. Musculoskeletal:  No back pain.  No joint pain.  No muscle tenderness. Extremities:  No  pain or swelling. Skin:  No rashes or skin changes. Neuro:  No headache, numbness or weakness, balance or coordination issues. Endocrine: Diabetes.  No thyroid issues, hot flashes or night sweats. Psych:  No mood changes, depression or anxiety. Pain:  No focal pain. Review of systems:  All other systems reviewed and found to be negative.  Physical Exam: Blood pressure (!) 153/72, pulse 90, temperature 99.7 F (37.6 C), temperature source Tympanic, resp. rate 18, weight (!) 315 lb 14.7 oz (143.3 kg), last menstrual period 07/17/2012. GENERAL:  Well developed, well nourished, heavy set woman sitting comfortably in the exam room in no acute distress. MENTAL STATUS:  Alert and oriented to person, place and time. HEAD:  Shoulder length brown hair.  Normocephalic, atraumatic, face symmetric, no Cushingoid features. EYES: Blue eyes. Pupils equal round and reactive to light and accomodation. No conjunctivitis or scleral icterus. ENT: Oropharynx clear without lesion. Gums unremarkable. Tongue normal. Mucous membranes moist.  RESPIRATORY: Clear to auscultation without rales, wheezes or rhonchi. CARDIOVASCULAR: Regular rate and rhythm without murmur, rub or gallop. BREASTS:  Right breast with post operative and post radiation changes.  Inferior edema (chronic).  No discrete masses, skin changes or nipple discharge.  Left breast with fibrocystic changes in the upper outer quadrant.  No discrete masses, skin changes or nipple discharge. ABDOMEN: Soft, non-tender, with active bowel sounds, and no appreciable hepatosplenomegaly. No masses. SKIN: No rashes, ulcers or lesions. EXTREMITIES: Chronic lower extremity changes.  No skin discoloration or tenderness. No palpable cords. LYMPH NODES: No palpable cervical, supraclavicular, axillary or inguinal adenopathy  NEUROLOGICAL: Unremarkable. PSYCH: Appropriate.   Appointment on 01/31/2016  Component Date Value Ref Range Status  . WBC 01/31/2016  12.6* 3.6 - 11.0 K/uL Final  . RBC 01/31/2016 4.27  3.80 - 5.20 MIL/uL Final  . Hemoglobin 01/31/2016 12.0  12.0 - 16.0 g/dL Final  . HCT 01/31/2016 37.4  35.0 - 47.0 % Final  . MCV 01/31/2016 87.5  80.0 - 100.0 fL Final  . MCH 01/31/2016 28.2  26.0 - 34.0 pg Final  . MCHC 01/31/2016 32.2  32.0 - 36.0 g/dL Final  . RDW 01/31/2016 17.4* 11.5 - 14.5 % Final  . Platelets 01/31/2016 224  150 - 440 K/uL Final  . Neutrophils Relative % 01/31/2016 78  % Final  . Neutro Abs 01/31/2016 9.8* 1.4 - 6.5 K/uL Final  . Lymphocytes Relative 01/31/2016 11  % Final  . Lymphs Abs 01/31/2016 1.4  1.0 - 3.6 K/uL Final  . Monocytes Relative 01/31/2016 7  % Final  . Monocytes  Absolute 01/31/2016 0.9  0.2 - 0.9 K/uL Final  . Eosinophils Relative 01/31/2016 4  % Final  . Eosinophils Absolute 01/31/2016 0.5  0 - 0.7 K/uL Final  . Basophils Relative 01/31/2016 0  % Final  . Basophils Absolute 01/31/2016 0.0  0 - 0.1 K/uL Final    Assessment:  Nicole Schmidt is a 50 y.o. female with a history of stage I right breast cancer status post wide local incision on 08/12/2012.  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  Sentinel lymph node was negative.  Tumor was ER/PR positive and Her2/neu negative.  Oncotype DX score was 14 (low risk).  She completed breast radiation in 10/2012.  She began tamoxifen in 10/2012, but discontinued it on 10/19/2014 following a submassive PE.    Bilateral mammogram on 09/02/2013 revealed no suspicious findings.  Right sided mammogram on 04/03/2014 revealed no suspicious findings.  Bilateral mammogram on 10/08/2015 revealed expected potential distortion, stable compared to prior studies and consistent with postsurgical changes. There were no suspicious mass masses, calcifications, sites of non-surgical architectural distortion or concerning asymmetries  She had a bilateral submassive pulmonary emboli on 10/18/2014.  Chest CT angiogram revealed submassive PE with right  heart strain.  She received catheter directed lytic therapy at [facility].  Lower extremity duplex was negative for DVT.  She was on Xarelto (discontinued secondary to oral bleeding).  She began Eliquis on 07/08/2015.  Hypercoagulable work-up revealed the following normal studies:  Factor V Leiden, prothrombin gene mutation, lupus anticoagulant panel, anticardiolipin antibodies, protein S total (127%), protein S activity (99%), protein C total (71%), protein C activity (77%), and ATIII activity (82%).  Bone density study on 01/26/2015 was normal with a T score of -0.3.  She has had no menses since 07/2012.  Labs on 01/18/2015 confirmed a post-menopausal status.  She began Femara on 06/10/2015.  She was noted to have a normocytic anemia on 06/10/2015.  Labs on 07/08/2015 revealed documented iron deficiency.  Ferritin was 25, iron saturation 6%, TIBC 473 (high).  Normal labs included B12, folate, SPEP revealed, LFTs, and guaiac cards x 3.   She is on oral iron.  She was noted to have stage III renal insufficiency on 06/10/2015.  Creatinine was 1.34 (CrCl 45 ml/min) with prior baseline Cr of 0.86 - 0.93. She was seen by nephrology.  Etiology was felt secondary to dehydration.  Creatinine is 1.09 today.  Symptomatically, she feels good.  Exam is stable.  Hematocrit is 37.4.  Ferritin is 46.  Plan: 1.  Labs today:  CBC with diff, CMP, ferritin, CA27.29. 2.  Continue Femara. 3.  Continue Eliquis. 4.  Continue oral iron.  Goal ferritin 100. 5.  RTC in 4 months for MD assessment and labs (CBC with diff, CMP, ferritin, CA27.29).  Addendum:  Xarelto does not need adjustment unless CrCl <= 30 ml/min.  Eliquis does not need to be adjusted if Cr < 1.5.  Serum creatinine > 2.5 or CrCl < 25 ml/min were excluded from clinical trials.   Lequita Asal, MD  01/31/2016, 4:38 PM

## 2016-02-01 LAB — CANCER ANTIGEN 27.29: CA 27.29: 19.8 U/mL (ref 0.0–38.6)

## 2016-02-28 ENCOUNTER — Emergency Department
Admission: EM | Admit: 2016-02-28 | Discharge: 2016-02-28 | Disposition: A | Discharge status: Home / Self Care | Payer: BLUE CROSS/BLUE SHIELD | Attending: Emergency Medicine | Admitting: Emergency Medicine

## 2016-02-28 ENCOUNTER — Encounter: Payer: Self-pay | Admitting: *Deleted

## 2016-02-28 ENCOUNTER — Emergency Department: Payer: BLUE CROSS/BLUE SHIELD

## 2016-02-28 DIAGNOSIS — Y999 Unspecified external cause status: Secondary | ICD-10-CM | POA: Insufficient documentation

## 2016-02-28 DIAGNOSIS — I129 Hypertensive chronic kidney disease with stage 1 through stage 4 chronic kidney disease, or unspecified chronic kidney disease: Secondary | ICD-10-CM | POA: Diagnosis not present

## 2016-02-28 DIAGNOSIS — W010XXA Fall on same level from slipping, tripping and stumbling without subsequent striking against object, initial encounter: Secondary | ICD-10-CM | POA: Diagnosis not present

## 2016-02-28 DIAGNOSIS — N183 Chronic kidney disease, stage 3 (moderate): Secondary | ICD-10-CM | POA: Diagnosis not present

## 2016-02-28 DIAGNOSIS — E1122 Type 2 diabetes mellitus with diabetic chronic kidney disease: Secondary | ICD-10-CM | POA: Diagnosis not present

## 2016-02-28 DIAGNOSIS — Y929 Unspecified place or not applicable: Secondary | ICD-10-CM | POA: Diagnosis not present

## 2016-02-28 DIAGNOSIS — S8001XA Contusion of right knee, initial encounter: Secondary | ICD-10-CM | POA: Diagnosis not present

## 2016-02-28 DIAGNOSIS — Z79899 Other long term (current) drug therapy: Secondary | ICD-10-CM | POA: Diagnosis not present

## 2016-02-28 DIAGNOSIS — Z7984 Long term (current) use of oral hypoglycemic drugs: Secondary | ICD-10-CM | POA: Diagnosis not present

## 2016-02-28 DIAGNOSIS — R042 Hemoptysis: Secondary | ICD-10-CM

## 2016-02-28 DIAGNOSIS — Y939 Activity, unspecified: Secondary | ICD-10-CM | POA: Insufficient documentation

## 2016-02-28 DIAGNOSIS — T148XXA Other injury of unspecified body region, initial encounter: Secondary | ICD-10-CM

## 2016-02-28 DIAGNOSIS — Z853 Personal history of malignant neoplasm of breast: Secondary | ICD-10-CM | POA: Diagnosis not present

## 2016-02-28 HISTORY — DX: Other pulmonary embolism without acute cor pulmonale: I26.99

## 2016-02-28 LAB — COMPREHENSIVE METABOLIC PANEL
ALT: 19 U/L (ref 14–54)
AST: 28 U/L (ref 15–41)
Albumin: 3.8 g/dL (ref 3.5–5.0)
Alkaline Phosphatase: 97 U/L (ref 38–126)
Anion gap: 11 (ref 5–15)
BUN: 44 mg/dL — ABNORMAL HIGH (ref 6–20)
CO2: 25 mmol/L (ref 22–32)
Calcium: 9.8 mg/dL (ref 8.9–10.3)
Chloride: 107 mmol/L (ref 101–111)
Creatinine, Ser: 1.19 mg/dL — ABNORMAL HIGH (ref 0.44–1.00)
GFR calc Af Amer: 60 mL/min — ABNORMAL LOW (ref >60)
GFR calc non Af Amer: 52 mL/min — ABNORMAL LOW (ref >60)
Glucose, Bld: 109 mg/dL — ABNORMAL HIGH (ref 65–99)
Potassium: 4.1 mmol/L (ref 3.5–5.1)
Sodium: 143 mmol/L (ref 135–145)
Total Bilirubin: 0.5 mg/dL (ref 0.3–1.2)
Total Protein: 7.3 g/dL (ref 6.5–8.1)

## 2016-02-28 LAB — CBC
HCT: 35 % (ref 35.0–47.0)
Hemoglobin: 11.5 g/dL — ABNORMAL LOW (ref 12.0–16.0)
MCH: 29.5 pg (ref 26.0–34.0)
MCHC: 32.8 g/dL (ref 32.0–36.0)
MCV: 90.1 fL (ref 80.0–100.0)
Platelets: 219 10*3/uL (ref 150–440)
RBC: 3.89 MIL/uL (ref 3.80–5.20)
RDW: 17.3 % — ABNORMAL HIGH (ref 11.5–14.5)
WBC: 9.7 10*3/uL (ref 3.6–11.0)

## 2016-02-28 NOTE — ED Provider Notes (Signed)
[facility] Emergency Department Provider Note  ____________________________________________   I have reviewed the triage vital signs and the nursing notes.   HISTORY  Chief Complaint Hemoptysis    HPI Nicole Schmidt is a 51 y.o. female who is on eliquis because of a PE and ongoing estrogenic therapy presents today with 2 complaint sprayed the first is that is been very dry and sometimes she has a little bit of blood in her nose when she wakes up in the morning and sometimes at the same time she has trace amounts of blood in her sputum. This happens for a few moments in the morning when she first clears her throat and then goes away. She's had no short of breath chest pain or hemoptysis she denies hematemesis melena bright red blood per rectum or any other bleeding. She is not having any frank epistaxis. Just dried blood in her nares and a trace of blood in her sputum in the morning. Presently going on for several weeks since winter started in her central heating has been used more often. In addition, patient states she tripped and fell landing on her right knee before 5 days ago. She does not believe is broken, she has been able to adulate on it with minimal difficulty but there is bruising and she is concerned about that as well.      Past Medical History:  Diagnosis Date  . Anemia   . Arthritis   . Breast cancer of upper-inner quadrant of right female breast ([place])    Right breast invasive CA and DCIS , T1,N0,M0. Er/PR pos, her 2 negative  . Diabetes mellitus without complication ([place])   . Gout   . Gout   . Hypertension   . Menopause    age 1  . Pulmonary embolism ([place]) 10/2014  . Renal insufficiency   . Thyroid goiter     Patient Active Problem List   Diagnosis Date Noted  . Chronic renal disease, stage III 07/08/2015  . Iron deficiency anemia 07/08/2015  . Acute renal failure superimposed on stage 3 chronic kidney disease ([place]) 10/22/2014  .  History of breast cancer 10/22/2014  . Elevated troponin 10/22/2014  . Pulmonary embolus ([place])   . Sepsis ([place]) 10/18/2014  . Pulmonary emboli ([place]) 10/18/2014  . PE (pulmonary embolism) 10/18/2014  . Candidiasis of breast 07/06/2014  . Malignant neoplasm of right female breast ([place]) 07/24/2012    Past Surgical History:  Procedure Laterality Date  . BREAST BIOPSY Right 2014  . BREAST SURGERY Right 2014   with sentinel node bx subareaolar duct excision    Prior to Admission medications   Medication Sig Start Date End Date Taking? Authorizing Provider  allopurinol (ZYLOPRIM) 300 MG tablet Take 300 mg by mouth daily.  12/31/14  Yes Historical Provider, MD  ARMOUR THYROID 60 MG tablet Take 2 tablets by mouth daily. 07/03/12  Yes Historical Provider, MD  Ascorbic Acid (VITAMIN C PO) Take 1 tablet by mouth 2 (two) times daily.    Yes Historical Provider, MD  atorvastatin (LIPITOR) 20 MG tablet Take 20 mg by mouth daily.   Yes Historical Provider, MD  Calcium Carbonate-Vitamin D (CALCIUM 600 + D PO) Take 1 tablet by mouth daily.   Yes Historical Provider, MD  CINNAMON PO Take 2,000 mg by mouth daily.   Yes Historical Provider, MD  Dulaglutide (TRULICITY) 1.5 GY/1.8HU SOPN Inject into the skin once a week.   Yes Historical Provider, MD  ELIQUIS 5 MG  TABS tablet TAKE 1 TABLET TWICE A DAY 01/04/16  Yes Lequita Asal, MD  ergocalciferol (VITAMIN D2) 50000 UNITS capsule Take 50,000 Units by mouth once a week. Take on Friday mornings   Yes Historical Provider, MD  ferrous sulfate 325 (65 FE) MG tablet Take 325 mg by mouth 2 (two) times daily with a meal.    Yes Historical Provider, MD  folic acid (FOLVITE) 315 MCG tablet Take 800 mcg by mouth daily.    Yes Historical Provider, MD  furosemide (LASIX) 20 MG tablet Take 20 mg by mouth.   Yes Historical Provider, MD  letrozole (FEMARA) 2.5 MG tablet TAKE 1 TABLET DAILY 01/04/16  Yes Lequita Asal, MD  lisinopril-hydrochlorothiazide  (PRINZIDE,ZESTORETIC) 20-25 MG per tablet Take 1 tablet by mouth daily. 07/03/12  Yes Historical Provider, MD  metFORMIN (GLUCOPHAGE) 1000 MG tablet Take 1 tablet by mouth 2 (two) times daily. 07/03/12  Yes Historical Provider, MD  nystatin (MYCOSTATIN/NYSTOP) 100000 UNIT/GM POWD Apply topically to rash 3 times a day Patient not taking: Reported on 02/28/2016 07/06/14   Lequita Asal, MD  ONE TOUCH ULTRA TEST test strip  04/21/15   Historical Provider, MD  Jonetta Speak LANCETS FINE [place]  04/21/15   Historical Provider, MD    Allergies Patient has no known allergies.  Family History  Problem Relation Age of Onset  . Diabetes Father   . Hypertension Father   . Parkinson's disease Father   . Hypertension Mother   . Rheum arthritis Mother   . Atrial fibrillation Mother   . Heart failure Mother     Social History Social History  Substance Use Topics  . Smoking status: Never Smoker  . Smokeless tobacco: Never Used  . Alcohol use No    Review of Systems Constitutional: No fever/chills Eyes: No visual changes. ENT: No sore throat. No stiff neck no neck pain Cardiovascular: Denies chest pain. Respiratory: Denies shortness of breath. Gastrointestinal:   no vomiting.  No diarrhea.  No constipation. Genitourinary: Negative for dysuria. Musculoskeletal: Negative lower extremity swelling Skin: Negative for rash. Neurological: Negative for severe headaches, focal weakness or numbness. 10-point ROS otherwise negative.  ____________________________________________   PHYSICAL EXAM:  VITAL SIGNS: ED Triage Vitals  Enc Vitals Group     BP 02/28/16 0829 (!) 170/79     Pulse Rate 02/28/16 0829 88     Resp 02/28/16 0829 18     Temp 02/28/16 0829 98.4 F (36.9 C)     Temp Source 02/28/16 0829 Oral     SpO2 02/28/16 0829 100 %     Weight 02/28/16 0829 (!) 310 lb (140.6 kg)     Height 02/28/16 0829 5\' 3"  (1.6 m)     Head Circumference --      Peak Flow --      Pain Score 02/28/16  1038 6     Pain Loc --      Pain Edu? --      Excl. in [place]? --     Constitutional: Alert and oriented. Well appearing and in no acute distress. Eyes: Conjunctivae are normal. PERRL. EOMI. Head: Atraumatic. Nose: No congestion/rhinnorhea. Mouth/Throat: Mucous membranes are moist.  Oropharynx non-erythematous. Neck: No stridor.   Nontender with no meningismus Cardiovascular: Normal rate, regular rhythm. Grossly normal heart sounds.  Good peripheral circulation. Respiratory: Normal respiratory effort.  No retractions. Lungs CTAB. Abdominal: Soft and nontender. No distention. No guarding no rebound Back:  There is no focal tenderness or step off.  there is no midline tenderness there are no lesions noted. there is no CVA tenderness Musculoskeletal: Bruising noted to the right knee but full range of motion no obvious fracture no evidence of ligamentous instability to varus or valgus stressing negative Lachman test no upper extremity tenderness. No joint effusions, no DVT signs strong distal pulses no edema Neurologic:  Normal speech and language. No gross focal neurologic deficits are appreciated.  Skin:  Skin is warm, dry and intact. No rash noted. Psychiatric: Mood and affect are normal. Speech and behavior are normal.  ____________________________________________   LABS (all labs ordered are listed, but only abnormal results are displayed)  Labs Reviewed  CBC - Abnormal; Notable for the following:       Result Value   Hemoglobin 11.5 (*)    RDW 17.3 (*)    All other components within normal limits  COMPREHENSIVE METABOLIC PANEL - Abnormal; Notable for the following:    Glucose, Bld 109 (*)    BUN 44 (*)    Creatinine, Ser 1.19 (*)    GFR calc non Af Amer 52 (*)    GFR calc Af Amer 60 (*)    All other components within normal limits   ____________________________________________  EKG  I personally interpreted any EKGs ordered by me or  triage  ____________________________________________  RADIOLOGY  I reviewed any imaging ordered by me or triage that were performed during my shift and, if possible, patient and/or family made aware of any abnormal findings. ____________________________________________   PROCEDURES  Procedure(s) performed: None  Procedures  Critical Care performed: None  ____________________________________________   INITIAL IMPRESSION / ASSESSMENT AND PLAN / ED COURSE  Pertinent labs & imaging results that were available during my care of the patient were reviewed by me and considered in my medical decision making (see chart for details).  Patient on blood thinners who had trace blood in her nostril who noticed slight amount of blood in her sputum. In the morning a few times over the last couple weeks. No evidence of frank hemoptysis no evidence of GI bleed and no evidence of any significant pulmonary pathology no evidence of anemia, patient very well-appearing. Vital signs are stable hemoglobin is stable chest x-ray is reassuring I don't think this is anything significant on advised her to use a humidifier and intranasal Vaseline. Return precautions and follow-up given and understood.  Patient also has a bruise to her knee, no evidence of fracture clinically given morbid obesity we did do an x-ray which is negative. Patient is able to work to 8 hour shifts on this knee with no difficulty. This just bruised at this time, return depressions and follow-up given and understood.   Clinical Course    ____________________________________________   FINAL CLINICAL IMPRESSION(S) / ED DIAGNOSES  Final diagnoses:  Bloody sputum  Bruise      This chart was dictated using voice recognition software.  Despite best efforts to proofread,  errors can occur which can change meaning.      Schuyler Amor, MD 02/28/16 [phone]

## 2016-02-28 NOTE — ED Notes (Signed)
Xray R knee done in room.

## 2016-02-28 NOTE — Discharge Instructions (Signed)
If you have any significant bony from her nose or mouth or trouble breathing or cough, any black or tarry stools any vomiting of blood or any other new or worrisome symptoms, return to the emergency room. Otherwise, follow closely with your primary care doctor. You have  some bruising on your leg. I would consider icing it, elevating it, using an Ace wrap, and follow closely with primary care doctor. If you have persistent systems you  may need to be referred to an orthopedic surgeon but that is not necessary at this time.

## 2016-02-28 NOTE — ED Triage Notes (Signed)
States she is on eliquis for hx of PE, states she has been spitting up blood tinged sputum since before christmas every morning, states she was previously on xarelto and had same problem, states bloody sputum has increased since she fell last Thursday, awake and alert in no acute distress

## 2016-04-14 ENCOUNTER — Encounter: Payer: Self-pay | Admitting: Hematology and Oncology

## 2016-05-29 ENCOUNTER — Inpatient Hospital Stay: Payer: BLUE CROSS/BLUE SHIELD | Attending: Hematology and Oncology

## 2016-05-29 ENCOUNTER — Inpatient Hospital Stay (HOSPITAL_BASED_OUTPATIENT_CLINIC_OR_DEPARTMENT_OTHER): Payer: BLUE CROSS/BLUE SHIELD | Admitting: Hematology and Oncology

## 2016-05-29 VITALS — BP 131/77 | HR 91 | Temp 99.3°F | Resp 91 | Wt 304.0 lb

## 2016-05-29 DIAGNOSIS — Z7984 Long term (current) use of oral hypoglycemic drugs: Secondary | ICD-10-CM | POA: Insufficient documentation

## 2016-05-29 DIAGNOSIS — I10 Essential (primary) hypertension: Secondary | ICD-10-CM | POA: Insufficient documentation

## 2016-05-29 DIAGNOSIS — Z86711 Personal history of pulmonary embolism: Secondary | ICD-10-CM | POA: Diagnosis not present

## 2016-05-29 DIAGNOSIS — C50211 Malignant neoplasm of upper-inner quadrant of right female breast: Secondary | ICD-10-CM | POA: Diagnosis not present

## 2016-05-29 DIAGNOSIS — C50911 Malignant neoplasm of unspecified site of right female breast: Secondary | ICD-10-CM

## 2016-05-29 DIAGNOSIS — M109 Gout, unspecified: Secondary | ICD-10-CM | POA: Diagnosis not present

## 2016-05-29 DIAGNOSIS — E119 Type 2 diabetes mellitus without complications: Secondary | ICD-10-CM | POA: Insufficient documentation

## 2016-05-29 DIAGNOSIS — Z7901 Long term (current) use of anticoagulants: Secondary | ICD-10-CM

## 2016-05-29 DIAGNOSIS — Z923 Personal history of irradiation: Secondary | ICD-10-CM | POA: Diagnosis not present

## 2016-05-29 DIAGNOSIS — I2782 Chronic pulmonary embolism: Secondary | ICD-10-CM

## 2016-05-29 DIAGNOSIS — N289 Disorder of kidney and ureter, unspecified: Secondary | ICD-10-CM | POA: Diagnosis not present

## 2016-05-29 DIAGNOSIS — Z170 Estrogen receptor positive status [ER+]: Secondary | ICD-10-CM | POA: Diagnosis not present

## 2016-05-29 DIAGNOSIS — Z79811 Long term (current) use of aromatase inhibitors: Secondary | ICD-10-CM | POA: Insufficient documentation

## 2016-05-29 DIAGNOSIS — D509 Iron deficiency anemia, unspecified: Secondary | ICD-10-CM

## 2016-05-29 LAB — CBC WITH DIFFERENTIAL/PLATELET
Basophils Absolute: 0.1 10*3/uL (ref 0.0–0.1)
Basophils Relative: 1 %
Eosinophils Absolute: 0.4 10*3/uL (ref 0.0–0.7)
Eosinophils Relative: 2 %
HCT: 35.9 % — ABNORMAL LOW (ref 36.0–46.0)
Hemoglobin: 11.7 g/dL — ABNORMAL LOW (ref 12.0–15.0)
Lymphocytes Relative: 11 %
Lymphs Abs: 1.5 10*3/uL (ref 0.7–4.0)
MCH: 28.5 pg (ref 26.0–34.0)
MCHC: 32.5 g/dL (ref 30.0–36.0)
MCV: 87.6 fL (ref 78.0–100.0)
Monocytes Absolute: 0.8 10*3/uL (ref 0.1–1.0)
Monocytes Relative: 6 %
Neutro Abs: 11.1 10*3/uL — ABNORMAL HIGH (ref 1.7–7.7)
Neutrophils Relative %: 81 %
Platelets: 260 10*3/uL (ref 150–400)
RBC: 4.1 MIL/uL (ref 3.87–5.11)
RDW: 16.3 % — ABNORMAL HIGH (ref 11.5–15.5)
WBC: 13.8 10*3/uL — ABNORMAL HIGH (ref 4.0–10.5)

## 2016-05-29 LAB — FERRITIN: Ferritin: 37 ng/mL (ref 11–307)

## 2016-05-29 NOTE — Progress Notes (Signed)
Patient offers no complaints today. 

## 2016-05-29 NOTE — Progress Notes (Signed)
[facility] day:  05/29/16     Chief Complaint: Nicole Schmidt is a 51 y.o. female with stage I breast cancer, history of submassive pulmonary embolism, and iron deficiency anemia who is seen for 3 month assessment on Femara.  HPI: The patient was last seen in the medical oncology clinic on 01/31/2016.  At that time, she feelt good.  Exam was stable.  Hematocrit was 37.4.  Ferritin was 46.  She continued Femara and Eliquis.  She continued oral iron.  During the interim, she has done well.  She voices no complaints.  She takes oral iron with vitamin C.  She denies any breast concerns.  She has a follow-up appointment with Dr. Jamal Collin on 10/07/2016.  She continues Femara.  She remains on Eliquis.  She denies any excess bruising or bleeding.   Past Medical History:  Diagnosis Date  . Anemia   . Arthritis   . Breast cancer of upper-inner quadrant of right female breast ([place])    Right breast invasive CA and DCIS , T1,N0,M0. Er/PR pos, her 2 negative  . Diabetes mellitus without complication ([place])   . Gout   . Gout   . Hypertension   . Menopause    age 61  . Pulmonary embolism ([place]) 10/2014  . Renal insufficiency   . Thyroid goiter     Past Surgical History:  Procedure Laterality Date  . BREAST BIOPSY Right 2014  . BREAST SURGERY Right 2014   with sentinel node bx subareaolar duct excision    Family History  Problem Relation Age of Onset  . Diabetes Father   . Hypertension Father   . Parkinson's disease Father   . Hypertension Mother   . Rheum arthritis Mother   . Atrial fibrillation Mother   . Heart failure Mother     Social History:  reports that she has never smoked. She has never used smokeless tobacco. She reports that she does not drink alcohol or use drugs.  Her mother died of a MI.  Her father died in 06-10-2015.  He had Parkinson's disease and dementia.  She is an only child.  She has never married.  The patient is alone  today.  Allergies: No Known Allergies  Current Medications: Current Outpatient Prescriptions  Medication Sig Dispense Refill  . allopurinol (ZYLOPRIM) 300 MG tablet Take 300 mg by mouth daily.     Francia Greaves THYROID 60 MG tablet Take 2 tablets by mouth daily.    . Ascorbic Acid (VITAMIN C PO) Take 1 tablet by mouth 2 (two) times daily.     Marland Kitchen atorvastatin (LIPITOR) 20 MG tablet Take 20 mg by mouth daily.    . Calcium Carbonate-Vitamin D (CALCIUM 600 + D PO) Take 1 tablet by mouth daily.    Marland Kitchen CINNAMON PO Take 2,000 mg by mouth daily.    . Dulaglutide (TRULICITY) 1.5 WH/6.7RF SOPN Inject into the skin once a week.    Marland Kitchen ELIQUIS 5 MG TABS tablet TAKE 1 TABLET TWICE A DAY 180 tablet 1  . ergocalciferol (VITAMIN D2) 50000 UNITS capsule Take 50,000 Units by mouth once a week. Take on Friday mornings    . ferrous sulfate 325 (65 FE) MG tablet Take 325 mg by mouth 2 (two) times daily with a meal.     . folic acid (FOLVITE) 163 MCG tablet Take 800 mcg by mouth daily.     . furosemide (LASIX) 20 MG tablet Take 20 mg  by mouth.    . letrozole (FEMARA) 2.5 MG tablet TAKE 1 TABLET DAILY 90 tablet 1  . lisinopril-hydrochlorothiazide (PRINZIDE,ZESTORETIC) 20-25 MG per tablet Take 1 tablet by mouth daily.    . metFORMIN (GLUCOPHAGE) 1000 MG tablet Take 1 tablet by mouth 2 (two) times daily.    Marland Kitchen nystatin (MYCOSTATIN/NYSTOP) 100000 UNIT/GM POWD Apply topically to rash 3 times a day 30 g 1  . ONE TOUCH ULTRA TEST test strip     . ONETOUCH DELICA LANCETS FINE MISC      No current facility-administered medications for this visit.     Review of Systems:  GENERAL:  Feels "good".  No fevers or sweats.  Weight down 11 pounds. PERFORMANCE STATUS (ECOG):  0 HEENT: No visual changes, runny nose, sore throat, mouth sores or tenderness. Lungs: No increased shortness of breath.  No cough.  No hemoptysis. Cardiac:  No chest pain, palpitations, orthopnea, or PND. GI:  No nausea, vomiting, diarrhea, constipation,  melena or hematochezia. GU:  No urgency, frequency, dysuria, or hematuria. Musculoskeletal:  No back pain.  No joint pain.  No muscle tenderness. Extremities:  No pain or swelling. Skin:  No rashes or skin changes. Neuro:  No headache, numbness or weakness, balance or coordination issues. Endocrine: Diabetes.  No thyroid issues, hot flashes or night sweats. Psych:  No mood changes, depression or anxiety. Pain:  No focal pain. Review of systems:  All other systems reviewed and found to be negative.  Physical Exam: Blood pressure 131/77, pulse 91, temperature 99.3 F (37.4 C), temperature source Tympanic, resp. rate (!) 91, weight (!) 304 lb (137.9 kg), last menstrual period 07/17/2012. GENERAL:  Well developed, well nourished, heavy set woman sitting comfortably in the exam room in no acute distress. MENTAL STATUS:  Alert and oriented to person, place and time. HEAD:  Shoulder length brown hair.  Normocephalic, atraumatic, face symmetric, no Cushingoid features. EYES: Blue eyes. Pupils equal round and reactive to light and accomodation. No conjunctivitis or scleral icterus. ENT: Oropharynx clear without lesion. Gums unremarkable. Tongue normal. Mucous membranes moist.  RESPIRATORY: Clear to auscultation without rales, wheezes or rhonchi. CARDIOVASCULAR: Regular rate and rhythm without murmur, rub or gallop. BREASTS:  Right breast with post operative and post radiation changes.  Inferior edema (chronic).  No discrete masses, skin changes or nipple discharge.  Left breast with fibrocystic changes in the upper outer quadrant.  No discrete masses, skin changes or nipple discharge. ABDOMEN: Soft, non-tender, with active bowel sounds, and no appreciable hepatosplenomegaly. No masses. SKIN: No rashes, ulcers or lesions. EXTREMITIES: Chronic lower extremity changes.  No skin discoloration or tenderness. No palpable cords. LYMPH NODES: No palpable cervical, supraclavicular, axillary or  inguinal adenopathy  NEUROLOGICAL: Unremarkable. PSYCH: Appropriate.   No visits with results within 3 Day(s) from this visit.  Latest known visit with results is:  Admission on 02/28/2016, Discharged on 02/28/2016  Component Date Value Ref Range Status  . WBC 02/28/2016 9.7  3.6 - 11.0 K/uL Final  . RBC 02/28/2016 3.89  3.80 - 5.20 MIL/uL Final  . Hemoglobin 02/28/2016 11.5* 12.0 - 16.0 g/dL Final  . HCT 02/28/2016 35.0  35.0 - 47.0 % Final  . MCV 02/28/2016 90.1  80.0 - 100.0 fL Final  . MCH 02/28/2016 29.5  26.0 - 34.0 pg Final  . MCHC 02/28/2016 32.8  32.0 - 36.0 g/dL Final  . RDW 02/28/2016 17.3* 11.5 - 14.5 % Final  . Platelets 02/28/2016 219  150 - 440 K/uL Final  .  Sodium 02/28/2016 143  135 - 145 mmol/L Final  . Potassium 02/28/2016 4.1  3.5 - 5.1 mmol/L Final  . Chloride 02/28/2016 107  101 - 111 mmol/L Final  . CO2 02/28/2016 25  22 - 32 mmol/L Final  . Glucose, Bld 02/28/2016 109* 65 - 99 mg/dL Final  . BUN 02/28/2016 44* 6 - 20 mg/dL Final  . Creatinine, Ser 02/28/2016 1.19* 0.44 - 1.00 mg/dL Final  . Calcium 02/28/2016 9.8  8.9 - 10.3 mg/dL Final  . Total Protein 02/28/2016 7.3  6.5 - 8.1 g/dL Final  . Albumin 02/28/2016 3.8  3.5 - 5.0 g/dL Final  . AST 02/28/2016 28  15 - 41 U/L Final  . ALT 02/28/2016 19  14 - 54 U/L Final  . Alkaline Phosphatase 02/28/2016 97  38 - 126 U/L Final  . Total Bilirubin 02/28/2016 0.5  0.3 - 1.2 mg/dL Final  . GFR calc non Af Amer 02/28/2016 52* >60 mL/min Final  . GFR calc Af Amer 02/28/2016 60* >60 mL/min Final   Comment: (NOTE) The eGFR has been calculated using the CKD EPI equation. This calculation has not been validated in all clinical situations. eGFR's persistently <60 mL/min signify possible Chronic Kidney Disease.   . Anion gap 02/28/2016 11  5 - 15 Final    Assessment:  Nicole Schmidt is a 51 y.o. female with a history of stage I right breast cancer status post wide local incision on 08/12/2012.  Pathology revealed a  grade III 0.7 cm invasive ductal carcinoma.  Sentinel lymph node was negative.  Tumor was ER/PR positive and Her2/neu negative.  Oncotype DX score was 14 (low risk).  She completed breast radiation in 10/2012.  She began tamoxifen in 10/2012, but discontinued it on 10/19/2014 following a submassive PE.    Bilateral mammogram on 09/02/2013 revealed no suspicious findings.  Right sided mammogram on 04/03/2014 revealed no suspicious findings.  Bilateral mammogram on 10/08/2015 revealed expected potential distortion, stable compared to prior studies and consistent with postsurgical changes. There were no suspicious mass masses, calcifications, sites of non-surgical architectural distortion or concerning asymmetries  CA27.29 has been followed: 21.7 on 04/24/2013, 21.4 on 07/31/2013, 17.9 on 03/09/2014, 20.7 on 01/18/2015, 17.2 on 06/10/2015, 22.5 on 08/13/2015, 16.6 on 11/08/2015, 19.8 on 01/31/2016, and 25.2 on 05/29/2016.  She had a bilateral submassive pulmonary emboli on 10/18/2014.  Chest CT angiogram revealed submassive PE with right heart strain.  She received catheter directed lytic [facility] at [facility].  Lower extremity duplex was negative for DVT.  She was on Xarelto (discontinued secondary to oral bleeding).  She began Eliquis on 07/08/2015.  Hypercoagulable work-up revealed the following normal studies:  Factor V Leiden, prothrombin gene mutation, lupus anticoagulant panel, anticardiolipin antibodies, protein S total (127%), protein S activity (99%), protein C total (71%), protein C activity (77%), and ATIII activity (82%).  Bone density study on 01/26/2015 was normal with a T score of -0.3.  She has had no menses since 07/2012.  Labs on 01/18/2015 confirmed a post-menopausal status.  She began Femara on 06/10/2015.  She was noted to have a normocytic anemia on 06/10/2015.  Labs on 07/08/2015 revealed documented iron deficiency.  Ferritin was 25, iron saturation 6%, TIBC 473 (high).  Normal  labs included B12, folate, SPEP revealed, LFTs, and guaiac cards x 3.   She is on oral iron with vitamin C.  Ferritin has been followed:  25 on 07/08/2015, 30 on 11/08/2015, 46 on 01/31/2016, and 37 on 05/29/2016.  She was noted to have stage III renal insufficiency on 06/10/2015.  Creatinine was 1.34 (CrCl 45 ml/min) with prior baseline Cr of 0.86 - 0.93. She was seen by nephrology.  Etiology was felt secondary to dehydration.  Creatinine is 1.19 today.  Symptomatically, she feels good.  Exam is stable.  Hematocrit is 35.9.   Plan: 1.  Labs today:  CBC with diff, CMP, ferritin, CA27.29. 2.  Anticipate mammogram on 10/07/2016 (ordered by Dr. Jamal Collin). 3.  Continue Femara. 4.  Continue Eliquis. 5.  Continue oral iron.  Goal ferritin 100. 6.  RTC in 6 months for MD assessment and labs (CBC with diff, CMP, ferritin, CA27.29).  Notation:  Xarelto does not need adjustment unless CrCl <= 30 ml/min.  Eliquis does not need to be adjusted if Cr < 1.5.  Serum creatinine > 2.5 or CrCl < 25 ml/min were excluded from clinical trials.   Lequita Asal, MD  05/29/2016, 4:35 PM

## 2016-05-30 ENCOUNTER — Telehealth: Payer: Self-pay | Admitting: *Deleted

## 2016-05-30 LAB — COMPREHENSIVE METABOLIC PANEL
ALT: 22 U/L (ref 14–54)
AST: 24 U/L (ref 15–41)
Albumin: 4.2 g/dL (ref 3.5–5.0)
Alkaline Phosphatase: 115 U/L (ref 38–126)
Anion gap: 8 (ref 5–15)
BUN: 41 mg/dL — ABNORMAL HIGH (ref 6–20)
CO2: 26 mmol/L (ref 22–32)
Calcium: 10.3 mg/dL (ref 8.9–10.3)
Chloride: 105 mmol/L (ref 101–111)
Creatinine, Ser: 1.4 mg/dL — ABNORMAL HIGH (ref 0.44–1.00)
GFR calc Af Amer: 49 mL/min — ABNORMAL LOW (ref >60)
GFR calc non Af Amer: 42 mL/min — ABNORMAL LOW (ref >60)
Glucose, Bld: 108 mg/dL — ABNORMAL HIGH (ref 65–99)
Potassium: 4.2 mmol/L (ref 3.5–5.1)
Sodium: 139 mmol/L (ref 135–145)
Total Bilirubin: 0.4 mg/dL (ref 0.3–1.2)
Total Protein: 8 g/dL (ref 6.5–8.1)

## 2016-05-30 LAB — CANCER ANTIGEN 27.29: CA 27.29: 25.2 U/mL (ref 0.0–38.6)

## 2016-05-30 NOTE — Telephone Encounter (Signed)
-----   Message from Lequita Asal, MD sent at 05/29/2016  5:25 PM EDT ----- Regarding: Please call patient  Continue oral iron.  M  ----- Message ----- From: Interface, Lab In [place] Sent: 05/29/2016   4:40 PM To: Lequita Asal, MD

## 2016-05-30 NOTE — Telephone Encounter (Signed)
Called patient and LVM that per MD she should continue oral iron.

## 2016-06-22 ENCOUNTER — Encounter: Payer: Self-pay | Admitting: Hematology and Oncology

## 2016-07-02 ENCOUNTER — Other Ambulatory Visit: Payer: Self-pay | Admitting: Hematology and Oncology

## 2016-07-31 ENCOUNTER — Other Ambulatory Visit: Payer: Self-pay

## 2016-07-31 ENCOUNTER — Telehealth: Payer: Self-pay

## 2016-07-31 DIAGNOSIS — C50111 Malignant neoplasm of central portion of right female breast: Secondary | ICD-10-CM

## 2016-07-31 DIAGNOSIS — Z170 Estrogen receptor positive status [ER+]: Secondary | ICD-10-CM

## 2016-07-31 NOTE — Telephone Encounter (Signed)
Message left for the patient to let us know where she would like her mammograms done this year.

## 2016-08-03 ENCOUNTER — Inpatient Hospital Stay
Admission: RE | Admit: 2016-08-03 | Discharge: 2016-08-03 | Disposition: A | Discharge status: Home / Self Care | Payer: Self-pay | Source: Ambulatory Visit | Attending: *Deleted | Admitting: *Deleted

## 2016-08-03 ENCOUNTER — Other Ambulatory Visit: Payer: Self-pay | Admitting: *Deleted

## 2016-08-03 DIAGNOSIS — Z9289 Personal history of other medical treatment: Secondary | ICD-10-CM

## 2016-08-31 IMAGING — CT CT ANGIO CHEST
1 of 2 series · 18 of 30 positions shown · IV contrast (APPLIED)
Comparison: Chest radiographs 5354 hr today.

CLINICAL DATA: 49-year-old female with sudden onset shortness of
breath. Possible left upper lung pneumonia on chest radiographs.
Current history of breast cancer. Subsequent encounter.

EXAM:
CT ANGIOGRAPHY CHEST WITH CONTRAST
TECHNIQUE: Multidetector CT imaging of the chest was performed using the
standard protocol during bolus administration of intravenous
contrast. Multiplanar CT image reconstructions and MIPs were
obtained to evaluate the vascular anatomy.
CONTRAST:  75mL OMNIPAQUE IOHEXOL 350 MG/ML SOLN

[Series 5: pe 1.0 thins · axial · 0.77mm/px · z∈[-264,-56]mm · 18 of 236 slices shown]
[im 14/236  lung]
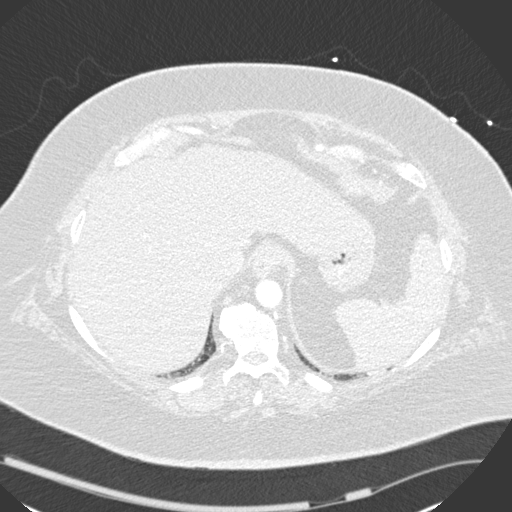
[im 27/236  mediastinal]
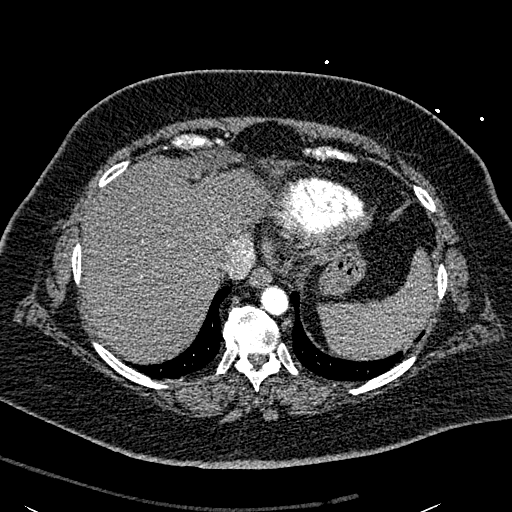
[im 40/236  lung]
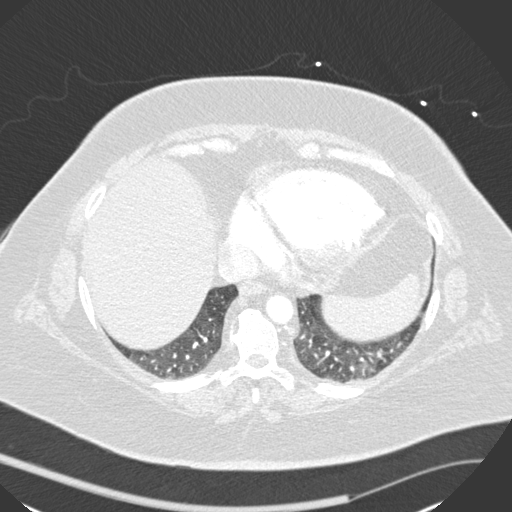
[im 53/236  mediastinal]
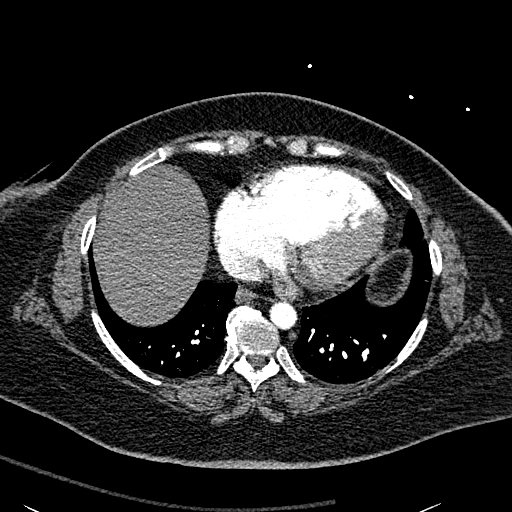
[im 66/236  lung]
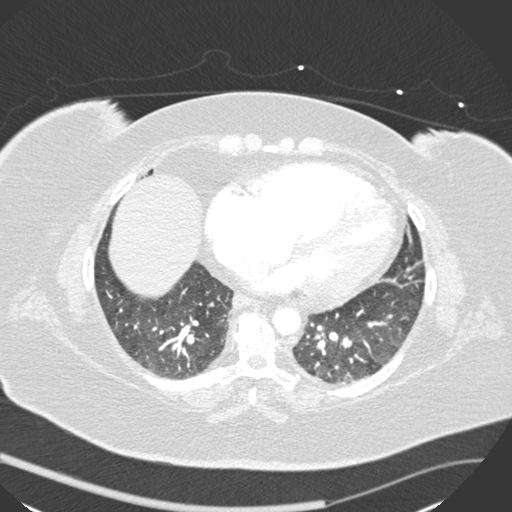
[im 79/236  mediastinal]
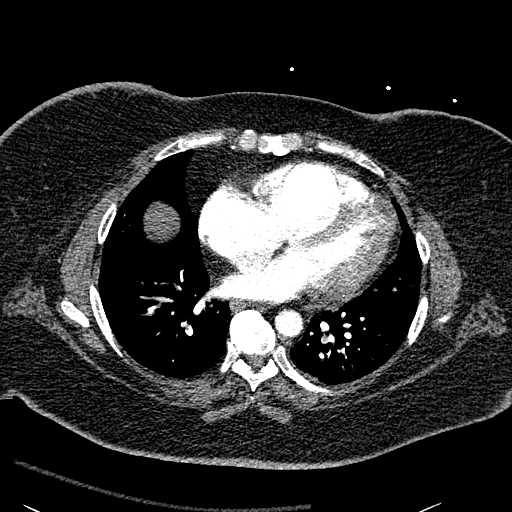
[im 92/236  lung]
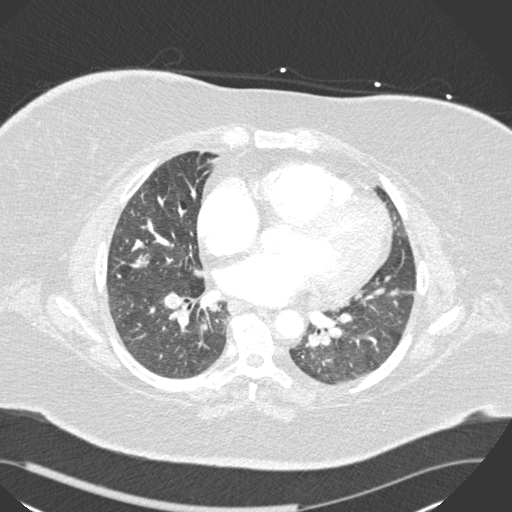
[im 105/236  mediastinal]
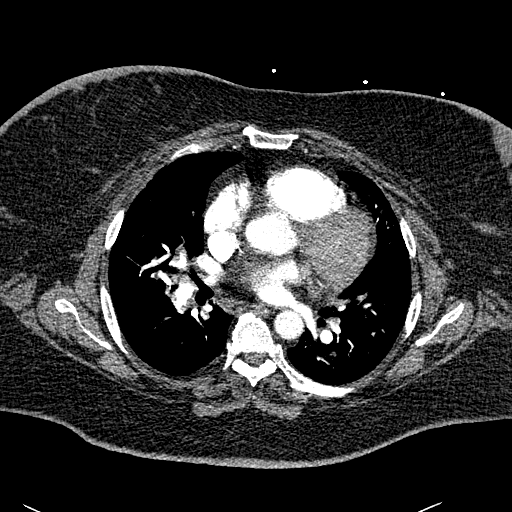
[im 109/236  lung]
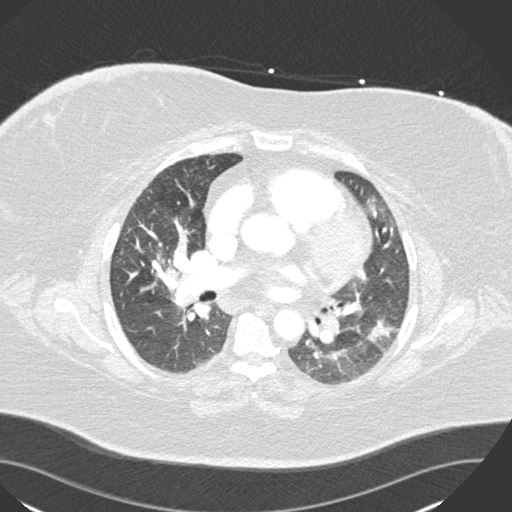
[im 118/236  mediastinal]
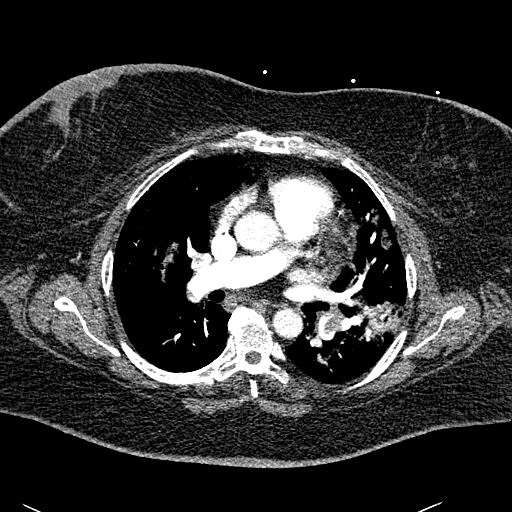
[im 131/236  lung]
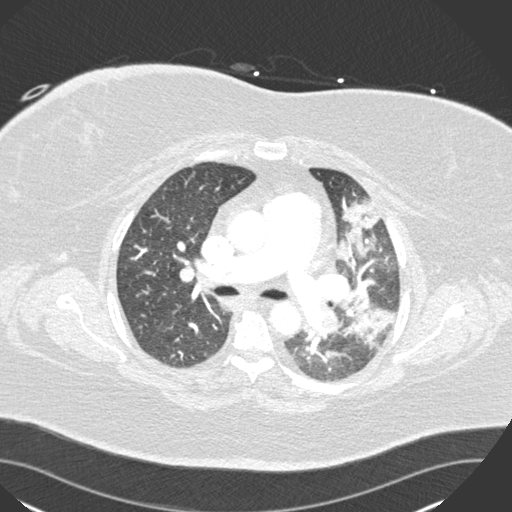
[im 144/236  mediastinal]
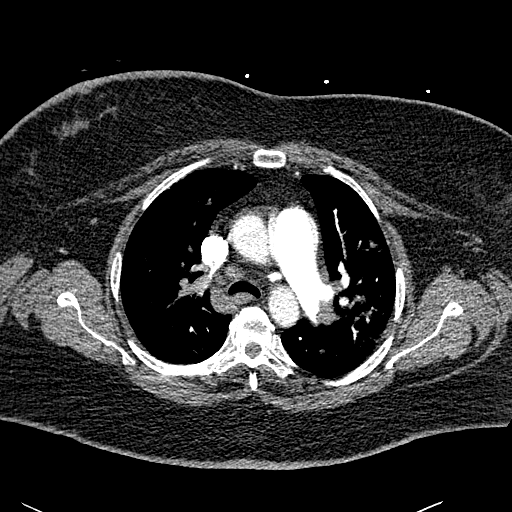
[im 157/236  lung]
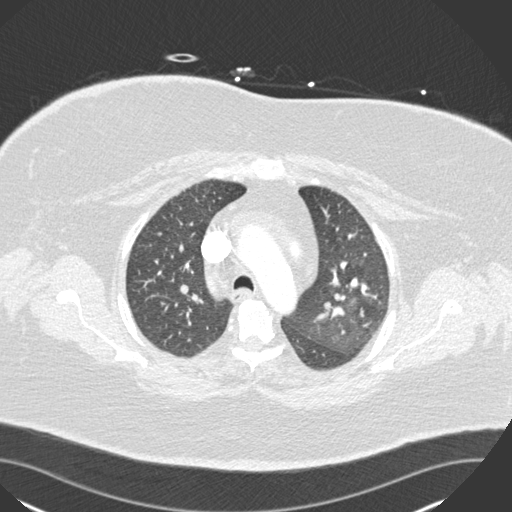
[im 170/236  mediastinal]
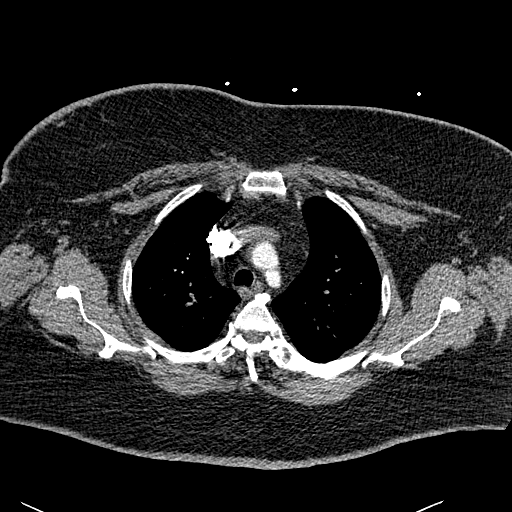
[im 183/236  lung]
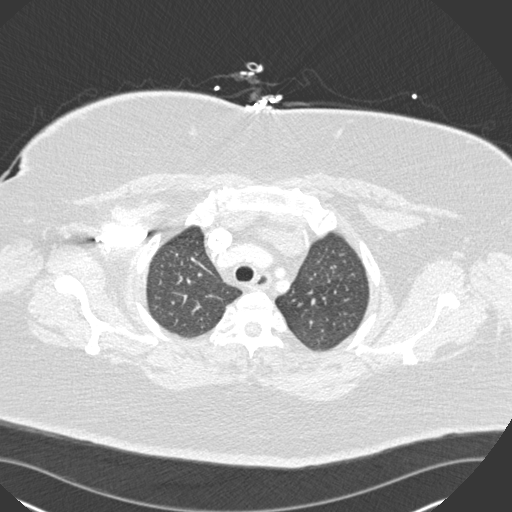
[im 196/236  mediastinal]
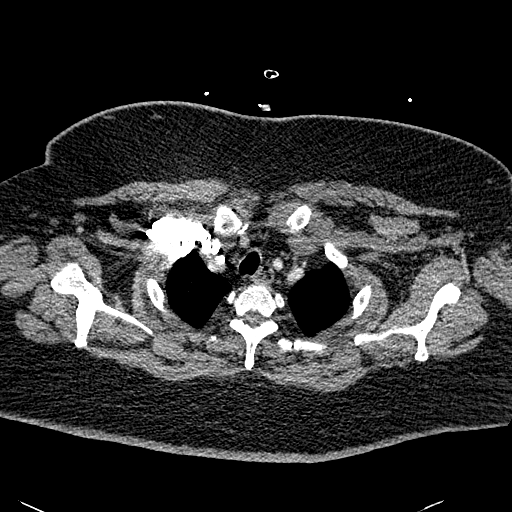
[im 209/236  lung]
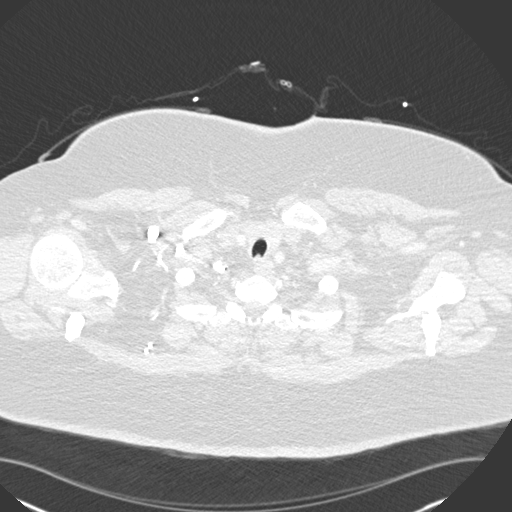
[im 222/236  mediastinal]
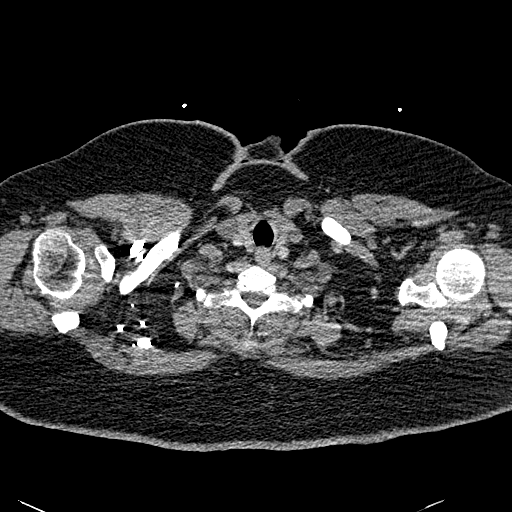

[18 of 30 positions shown; findings below may reference images not displayed]

FINDINGS: Good contrast bolus timing in the pulmonary arterial tree. Positive
for bilateral acute pulmonary embolus. Distal main pulmonary artery
thrombus bilaterally. Confluent segmental thrombus greater on the
right.

Left ventricular hypertrophy.  RV / LV ratio greater than 1.

Cardiomegaly. No pericardial effusion. No pleural effusion. Patchy
and confluent left greater than right peribronchovascular opacity
about the hila. Mild superimposed scattered ground-glass opacity,
resembling mosaic attenuation in the lower lobes. Major airways are
patent.

No mediastinal lymphadenopathy. Mild calcified aortic
atherosclerosis. There is some evidence of coronary artery calcified
plaque. No axillary lymphadenopathy. Negative visualized thoracic
inlet.

Visualized liver, spleen, and bowel in the upper abdomen are within
normal limits.

Degenerative changes in the thoracic spine. No acute or suspicious
osseous lesion identified.

Review of the MIP images confirms the above findings.
IMPRESSION: 1. Positive for acute PE with CT evidence of right heart strain
(RV/LV Ratio > 1) consistent with at least submassive (intermediate
risk) PE. The presence of right heart strain has been associated
with an increased risk of morbidity and mortality. Please activate
Code PE by paging [phone].
2. Critical Value/emergent results were called by telephone at the
time of interpretation on 10/18/2014 at [DATE] to Dr. INDESRA MASTORI ,
who verbally acknowledged these results.
3. Patchy perihilar opacity is nonspecific, suspicious for
infarction. No pleural or pericardial effusion.

## 2016-08-31 IMAGING — CR DG CHEST 2V
2 series · 2 of 2 positions shown · non-contrast
Comparison: None.

CLINICAL DATA: Short of breath for 1 week.

EXAM:
CHEST  2 VIEW

[chest pa]
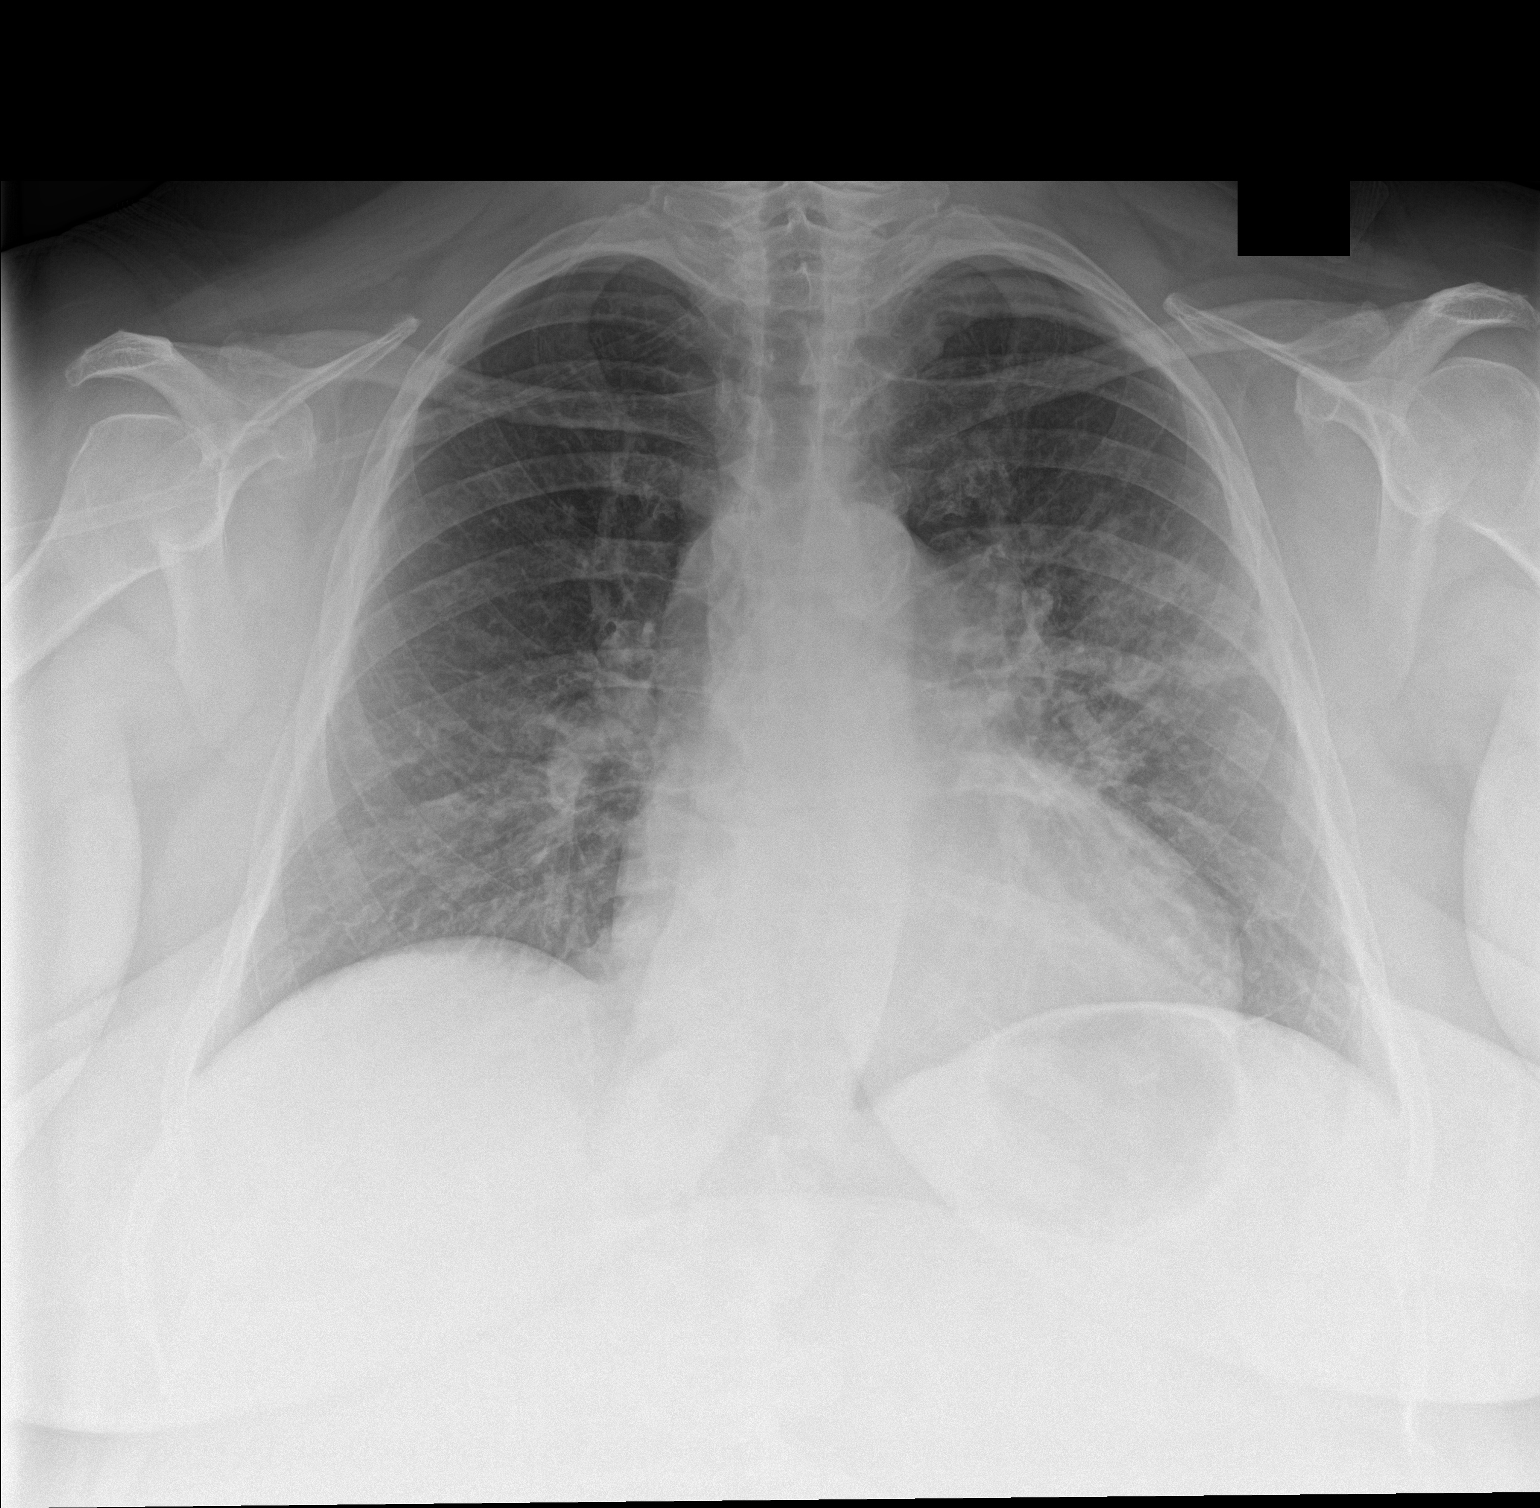

[chest lat]
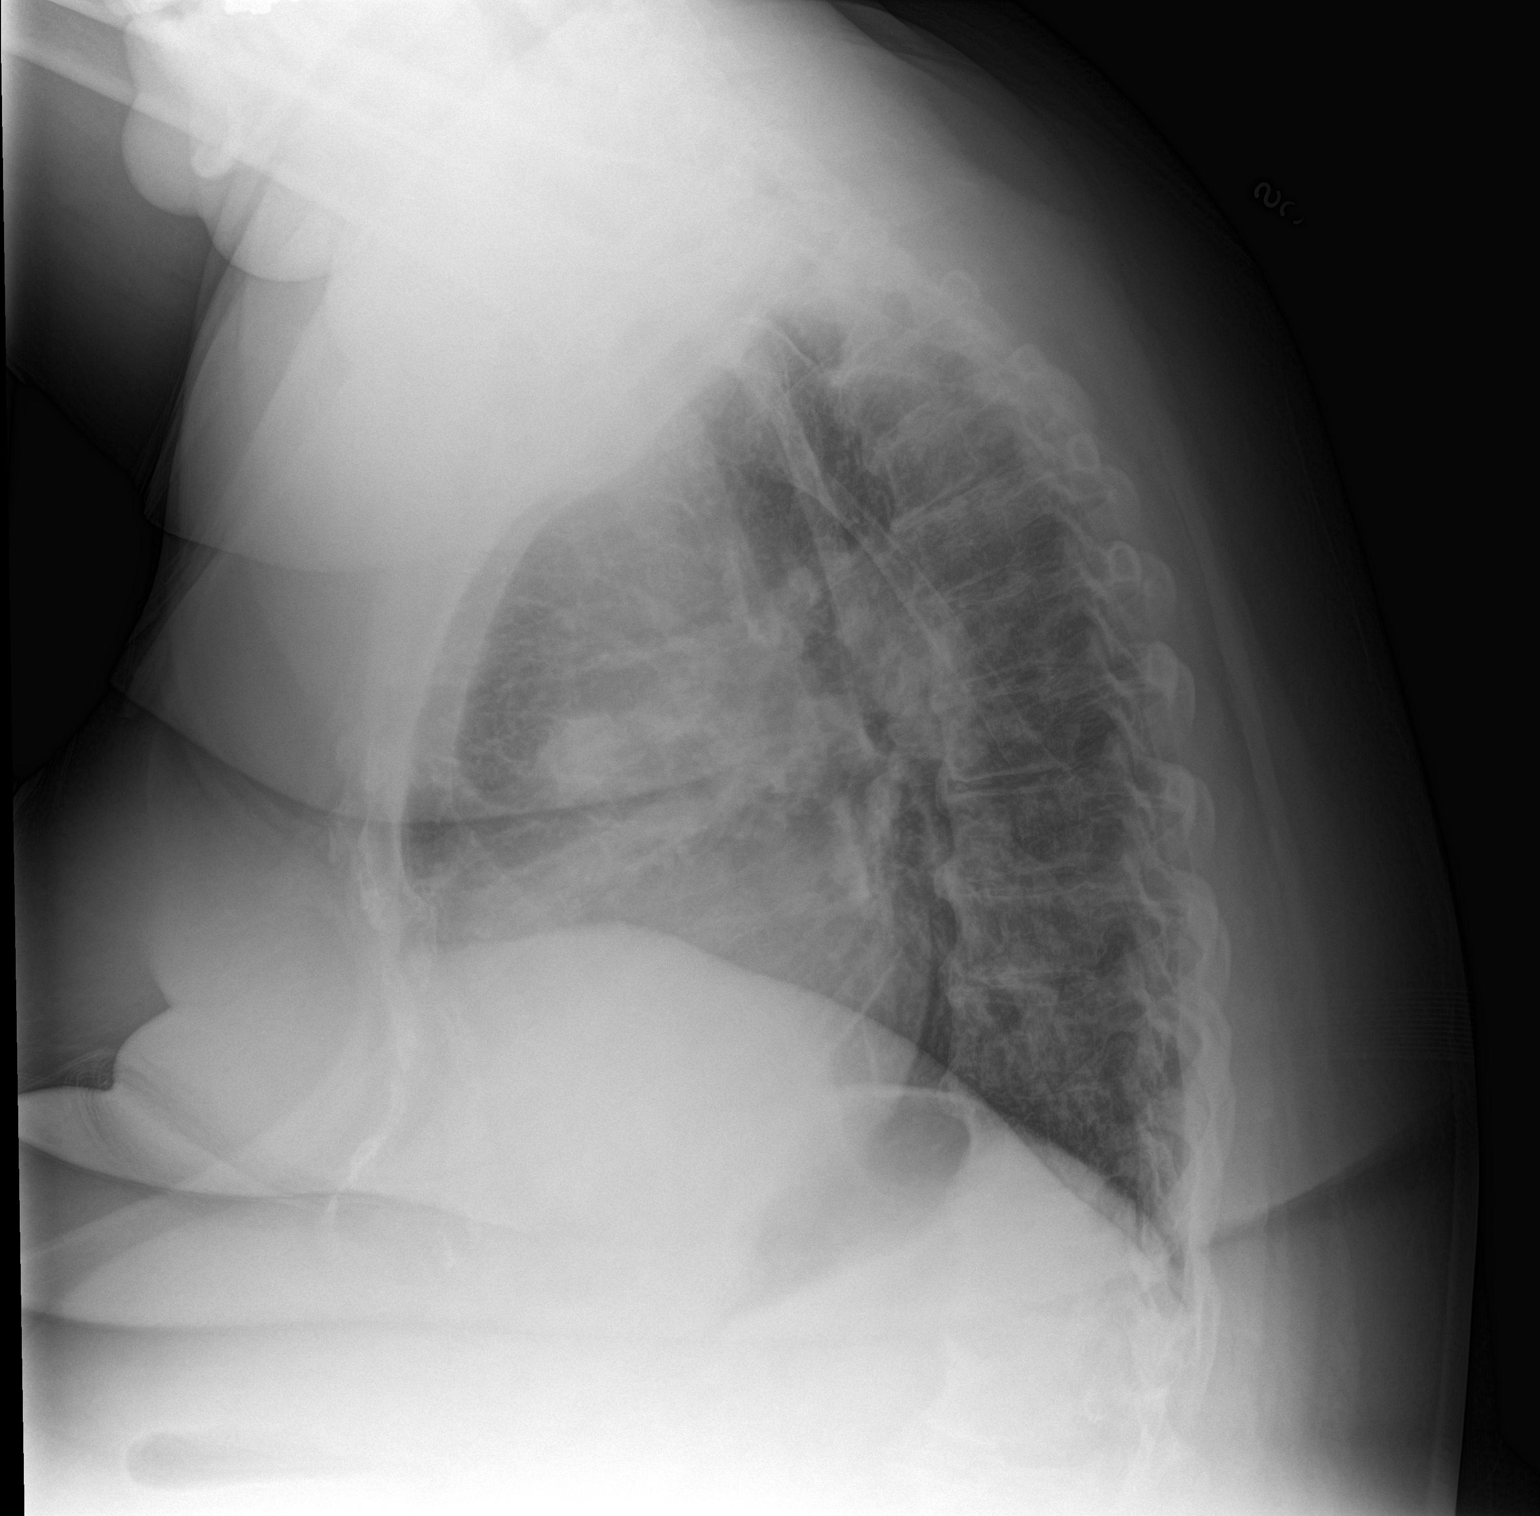

[2 of 2 positions shown; findings below may reference images not displayed]

FINDINGS: NORMAL CARDIAC SILHOUETTE. THERE IS FINE AIRSPACE DISEASE IN LEFT
UPPER LOBE. No pleural fluid. No pneumothorax. Degenerative
osteophytosis of the thoracic spine.
IMPRESSION: LEFT upper lobe pneumonia.

## 2016-09-01 IMAGING — XA IR THROMB F/U EVAL ART/VEN FINAL DAY
1 series · 2 of 2 positions shown · non-contrast
Comparison: Initiation of bilateral ultrasound assisted pulmonary
arterial thrombolysis - 10/18/2014;

INDICATION: History of sub massive pulmonary embolism, post initiation of
bilateral ultrasound assisted pulmonary arterial thrombolysis.

pulmonary arterial pressure measurements.
EXAM:
IR THROMB F/U EVAL DIGNORAH/OTAVIANO FINAL DAY
TECHNIQUE: The patient was positioned supine on the fluoroscopy table. A
preprocedural spot fluoroscopic image was obtained of the chest.

[Series 1: run · 2 of 2 slices shown]
[im 1/2]
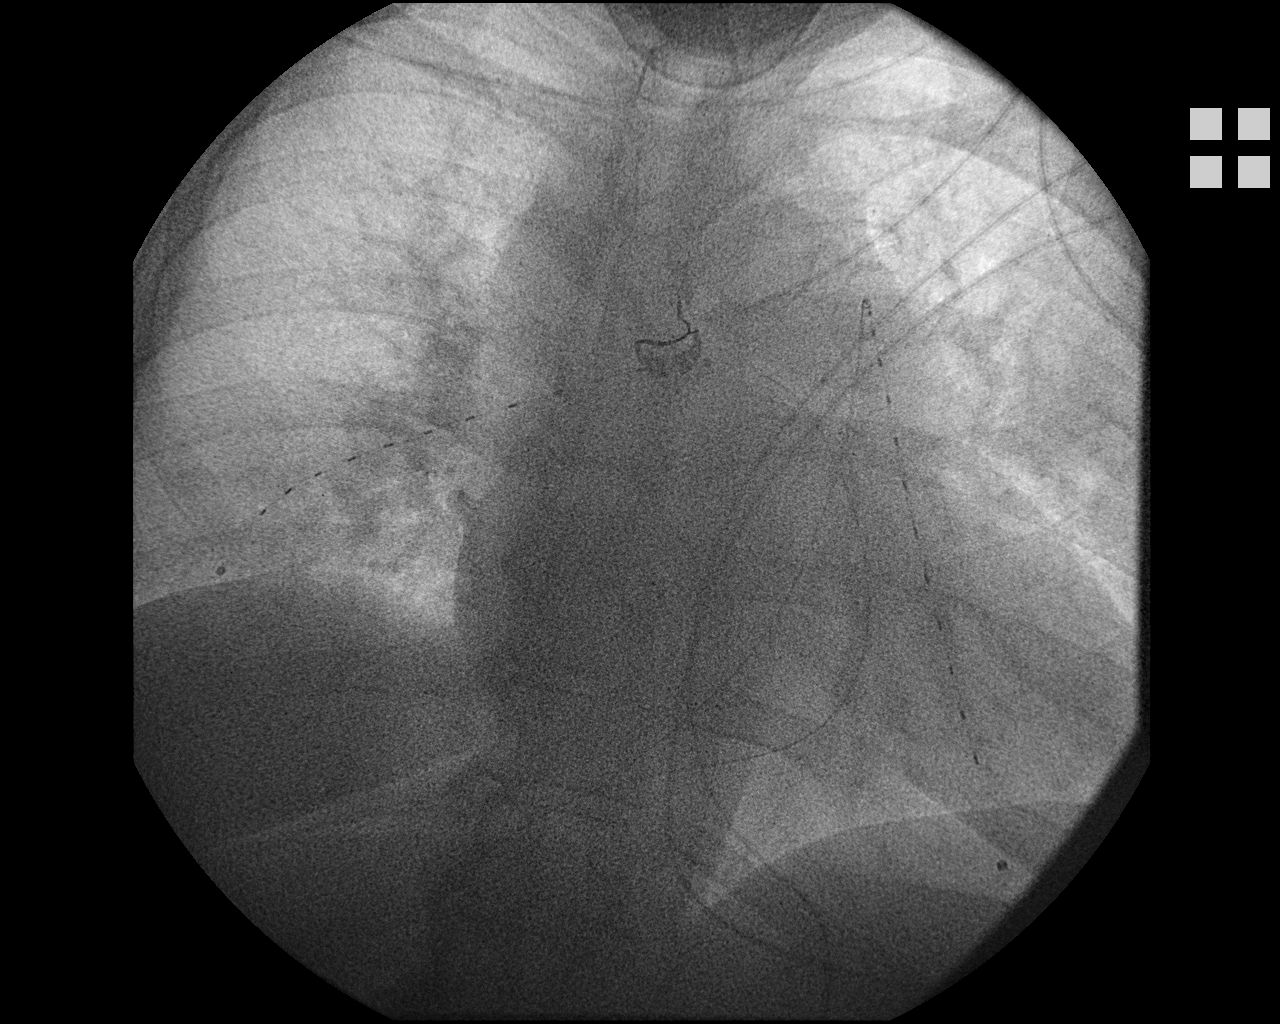
[im 2/2]
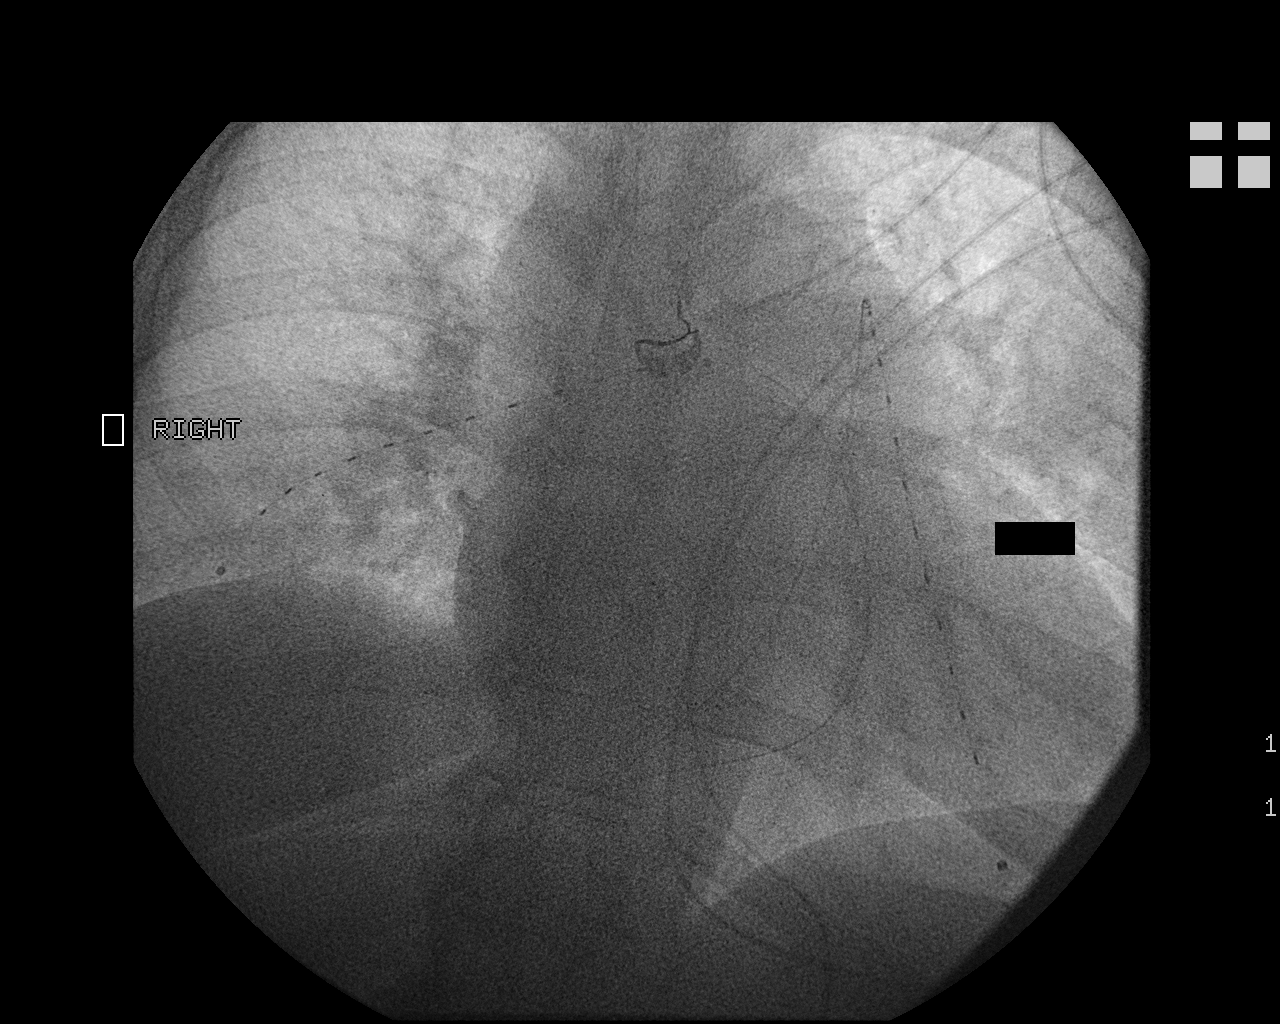

[2 of 2 positions shown; findings below may reference images not displayed]

chest CTA -10/18/2014

MEDICATIONS:
None.

CONTRAST:  None

FLUOROSCOPY TIME:  6 seconds.

COMPLICATIONS:
None immediate
The infusion wire of right pulmonary arterial infusion catheter was
removed and the catheter was retracted to the level of the main
pulmonary artery. Pulmonary arterial pressure measurements were
obtained through the gland porch of the infusion catheter. This
procedure was repeated for the contralateral left pulmonary arterial
infusion catheter.

Pressure measurement results were discussed with referring
physician, Dr. Giorgi, and the decision was made to terminate the
procedure.

As such, all wires catheters and sheaths were removed from the
patient. Hemostasis was achieved at the right groin access site with
manual compression. A dressing was placed. The patient tolerated the
procedure well without immediate postprocedural complication.
FINDINGS: Fluoroscopic imaging demonstrates unchanged positioning of the
bilateral pulmonary arterial ultrasound assisted infusion catheters
with distal radiopaque tips overlying expected location of bilateral
subsegmental pulmonary arteries.

Main pulmonary arterial pressure measurements at the time of
pulmonary arterial initiation - 54/26 mm Hg (mean - 36).

Postprocedural main pulmonary arterial measurements obtained via the
right pulmonary arterial infusion catheter: 54/28 mm Hg (mean - 37)

Postprocedural main pulmonary arterial measurements obtained via the
left pulmonary arterial infusion catheter: 51/27 mm Hg (mean - 36)
IMPRESSION: No significant reduction in main pulmonary arterial pressure
following 12 hours of ultrasound assisted pulmonary arterial
thrombolysis. This was discussed with referring physician, Dr.
Giorgi, and the decision was made to terminate the procedure, as it
is believed the patient has chronic pre-existing pulmonary arterial
hypertension and sleep apnea and as such, continued thrombolysis is
not indicated.

Above discussed with Dr. Giorgi at the time of procedure completion.

## 2016-10-10 ENCOUNTER — Ambulatory Visit
Admission: RE | Admit: 2016-10-10 | Discharge: 2016-10-10 | Disposition: A | Discharge status: Home / Self Care | Payer: BLUE CROSS/BLUE SHIELD | Source: Ambulatory Visit | Attending: General Surgery | Admitting: General Surgery

## 2016-10-10 DIAGNOSIS — C50111 Malignant neoplasm of central portion of right female breast: Secondary | ICD-10-CM

## 2016-10-10 DIAGNOSIS — Z170 Estrogen receptor positive status [ER+]: Secondary | ICD-10-CM | POA: Insufficient documentation

## 2016-10-19 ENCOUNTER — Encounter: Payer: Self-pay | Admitting: General Surgery

## 2016-10-19 ENCOUNTER — Ambulatory Visit (INDEPENDENT_AMBULATORY_CARE_PROVIDER_SITE_OTHER): Payer: BLUE CROSS/BLUE SHIELD | Admitting: General Surgery

## 2016-10-19 VITALS — BP 136/64 | HR 96 | Resp 16 | Ht 63.0 in | Wt 313.0 lb

## 2016-10-19 DIAGNOSIS — C50111 Malignant neoplasm of central portion of right female breast: Secondary | ICD-10-CM | POA: Diagnosis not present

## 2016-10-19 DIAGNOSIS — Z170 Estrogen receptor positive status [ER+]: Secondary | ICD-10-CM | POA: Diagnosis not present

## 2016-10-19 NOTE — Progress Notes (Signed)
Patient ID: Nicole Schmidt, female   DOB: 1966-01-19, 51 y.o.   MRN: [id number]  Chief Complaint  Patient presents with  . Follow-up    mammogram    HPI Nicole Schmidt is a 51 y.o. female. who presents for her follow up breast cancer and a breast evaluation. The most recent mammogram was done on 10-10-16.  Patient does perform regular self breast checks and gets regular mammograms done.   She did develop a pulmonary embolus about 1-2 months after starting tamoxifen in 2016 and she was then changed to letrozole. Tolerating letrozole, mild hot flashes. No new breast issues.  HPI  Past Medical History:  Diagnosis Date  . Anemia   . Arthritis   . Breast cancer of upper-inner quadrant of right female breast ([place])    Right breast invasive CA and DCIS , T1,N0,M0. Er/PR pos, her 2 negative  . Diabetes mellitus without complication ([place])   . Gout   . Gout   . Hypertension   . Menopause    age 45  . Pulmonary embolism ([place]) 10/2014  . Renal insufficiency   . Thyroid goiter     Past Surgical History:  Procedure Laterality Date  . BREAST BIOPSY Right 2014   breast ca  . BREAST EXCISIONAL BIOPSY Right 2014   breast ca  . BREAST SURGERY Right 2014   with sentinel node bx subareaolar duct excision    Family History  Problem Relation Age of Onset  . Diabetes Father   . Hypertension Father   . Parkinson's disease Father   . Hypertension Mother   . Rheum arthritis Mother   . Atrial fibrillation Mother   . Heart failure Mother   . Colon cancer Cousin 65    Social History Social History  Substance Use Topics  . Smoking status: Never Smoker  . Smokeless tobacco: Never Used  . Alcohol use No    No Known Allergies  Current Outpatient Prescriptions  Medication Sig Dispense Refill  . allopurinol (ZYLOPRIM) 300 MG tablet Take 300 mg by mouth daily.     Francia Greaves THYROID 60 MG tablet Take 2 tablets by mouth daily.    . Ascorbic Acid (VITAMIN C PO) Take 1 tablet by mouth 2 (two)  times daily.     Marland Kitchen atorvastatin (LIPITOR) 20 MG tablet Take 20 mg by mouth daily.    . Calcium Carbonate-Vitamin D (CALCIUM 600 + D PO) Take 1 tablet by mouth daily.    Marland Kitchen CINNAMON PO Take 2,000 mg by mouth daily.    . Dulaglutide (TRULICITY) 1.5 YH/0.6CB SOPN Inject into the skin once a week.    Marland Kitchen ELIQUIS 5 MG TABS tablet TAKE 1 TABLET TWICE A DAY 180 tablet 1  . ergocalciferol (VITAMIN D2) 50000 UNITS capsule Take 50,000 Units by mouth once a week. Take on Friday mornings    . ferrous sulfate 325 (65 FE) MG tablet Take 325 mg by mouth 2 (two) times daily with a meal.     . folic acid (FOLVITE) 762 MCG tablet Take 800 mcg by mouth daily.     . furosemide (LASIX) 20 MG tablet Take 20 mg by mouth.    . letrozole (FEMARA) 2.5 MG tablet TAKE 1 TABLET DAILY 90 tablet 1  . lisinopril-hydrochlorothiazide (PRINZIDE,ZESTORETIC) 20-25 MG per tablet Take 1 tablet by mouth daily.    . metFORMIN (GLUCOPHAGE) 1000 MG tablet Take 1 tablet by mouth 2 (two) times daily.    Marland Kitchen nystatin (MYCOSTATIN/NYSTOP) 100000 UNIT/GM  POWD Apply topically to rash 3 times a day 30 g 1  . ONE TOUCH ULTRA TEST test strip     . ONETOUCH DELICA LANCETS FINE MISC      No current facility-administered medications for this visit.     Review of Systems Review of Systems  Constitutional: Negative.   Respiratory: Negative.   Cardiovascular: Negative.     Blood pressure 136/64, pulse 96, resp. rate 16, height 5\' 3"  (1.6 m), weight (!) 313 lb (142 kg), last menstrual period 07/17/2012.  Physical Exam Physical Exam  Constitutional: She is oriented to person, place, and time. She appears well-developed and well-nourished.  HENT:  Mouth/Throat: Oropharynx is clear and moist.  Eyes: Conjunctivae are normal. No scleral icterus.  Neck: Neck supple.  Cardiovascular: Normal rate, regular rhythm and normal heart sounds.   Pulmonary/Chest: Effort normal and breath sounds normal. Right breast exhibits skin change. Right breast exhibits  no inverted nipple, no mass, no nipple discharge and no tenderness. Left breast exhibits no inverted nipple, no mass, no nipple discharge, no skin change and no tenderness.  Pur d'orange right breast from radiation  Abdominal: Soft. There is no tenderness.  Lymphadenopathy:    She has no cervical adenopathy.    She has no axillary adenopathy.  Neurological: She is alert and oriented to person, place, and time.  Skin: Skin is warm and dry.  Psychiatric: Her behavior is normal.    Data Reviewed Mammogram reviewed   Assessment    Stable physical exam. Pt is 4 years post right breast lumpectomy, sentinel node biopsy and radiation for stage 1 CA.  Currently on letrozole and doing well.      Plan    Patient to have a bilateral diagnostic mammogram follow up in one year with Dr Bary Castilla.  Continue self breast exams. Call office for any new breast issues or new concerns Discussed need for screening colonoscopy She will call back when she decides to have colonoscopy.  Colonoscopy with possible biopsy/polypectomy prn: Information regarding the procedure, including its potential risks and complications (including but not limited to perforation of the bowel, which may require emergency surgery to repair, and bleeding) was verbally given to the patient. Educational information regarding lower intestinal endoscopy was given to the patient. Written instructions for how to complete the bowel prep using Miralax were provided. The importance of drinking ample fluids to avoid dehydration as a result of the prep emphasized.       HPI, Physical Exam, Assessment and Plan have been scribed under the direction and in the presence of Mckinley Jewel, MD Karie Fetch, RN I have completed the exam and reviewed the above documentation for accuracy and completeness.  I agree with the above.  Haematologist has been used and any errors in dictation or transcription are unintentional.  Shaylah Mcghie G. Jamal Collin,  M.D., F.A.C.S.   Junie Panning G 10/23/2016, 12:56 PM

## 2016-10-19 NOTE — Patient Instructions (Addendum)
The patient is aware to call back for any questions or new concerns. Patient to have a bilateral diagnostic mammogram follow up in one year with Dr Bary Castilla.  Continue self breast exams.   Recommend mild support hose for her lower legs She will call back when she decides to have colonoscopy.  Colonoscopy, Adult A colonoscopy is an exam to look at the entire large intestine. During the exam, a lubricated, bendable tube is inserted into the anus and then passed into the rectum, colon, and other parts of the large intestine. A colonoscopy is often done as a part of normal colorectal screening or in response to certain symptoms, such as anemia, persistent diarrhea, abdominal pain, and blood in the stool. The exam can help screen for and diagnose medical problems, including:  Tumors.  Polyps.  Inflammation.  Areas of bleeding.  Tell a health care provider about:  Any allergies you have.  All medicines you are taking, including vitamins, herbs, eye drops, creams, and over-the-counter medicines.  Any problems you or family members have had with anesthetic medicines.  Any blood disorders you have.  Any surgeries you have had.  Any medical conditions you have.  Any problems you have had passing stool. What are the risks? Generally, this is a safe procedure. However, problems may occur, including:  Bleeding.  A tear in the intestine.  A reaction to medicines given during the exam.  Infection (rare).  What happens before the procedure? Eating and drinking restrictions Follow instructions from your health care provider about eating and drinking, which may include:  A few days before the procedure - follow a low-fiber diet. Avoid nuts, seeds, dried fruit, raw fruits, and vegetables.  1-3 days before the procedure - follow a clear liquid diet. Drink only clear liquids, such as clear broth or bouillon, black coffee or tea, clear juice, clear soft drinks or sports drinks, gelatin  dessert, and popsicles. Avoid any liquids that contain red or purple dye.  On the day of the procedure - do not eat or drink anything during the 2 hours before the procedure, or within the time period that your health care provider recommends.  Bowel prep If you were prescribed an oral bowel prep to clean out your colon:  Take it as told by your health care provider. Starting the day before your procedure, you will need to drink a large amount of medicated liquid. The liquid will cause you to have multiple loose stools until your stool is almost clear or light green.  If your skin or anus gets irritated from diarrhea, you may use these to relieve the irritation: ? Medicated wipes, such as adult wet wipes with aloe and vitamin E. ? A skin soothing-product like petroleum jelly.  If you vomit while drinking the bowel prep, take a break for up to 60 minutes and then begin the bowel prep again. If vomiting continues and you cannot take the bowel prep without vomiting, call your health care provider.  General instructions  Ask your health care provider about changing or stopping your regular medicines. This is especially important if you are taking diabetes medicines or blood thinners.  Plan to have someone take you home from the hospital or clinic. What happens during the procedure?  An IV tube may be inserted into one of your veins.  You will be given medicine to help you relax (sedative).  To reduce your risk of infection: ? Your health care team will wash or sanitize their hands. ?  Your anal area will be washed with soap.  You will be asked to lie on your side with your knees bent.  Your health care provider will lubricate a long, thin, flexible tube. The tube will have a camera and a light on the end.  The tube will be inserted into your anus.  The tube will be gently eased through your rectum and colon.  Air will be delivered into your colon to keep it open. You may feel some  pressure or cramping.  The camera will be used to take images during the procedure.  A small tissue sample may be removed from your body to be examined under a microscope (biopsy). If any potential problems are found, the tissue will be sent to a lab for testing.  If small polyps are found, your health care provider may remove them and have them checked for cancer cells.  The tube that was inserted into your anus will be slowly removed. The procedure may vary among health care providers and hospitals. What happens after the procedure?  Your blood pressure, heart rate, breathing rate, and blood oxygen level will be monitored until the medicines you were given have worn off.  Do not drive for 24 hours after the exam.  You may have a small amount of blood in your stool.  You may pass gas and have mild abdominal cramping or bloating due to the air that was used to inflate your colon during the exam.  It is up to you to get the results of your procedure. Ask your health care provider, or the department performing the procedure, when your results will be ready. This information is not intended to replace advice given to you by your health care provider. Make sure you discuss any questions you have with your health care provider. Document Released: 01/28/2000 Document Revised: 12/01/2015 Document Reviewed: 04/13/2015 Elsevier Interactive Patient Education  2018 [employer].

## 2016-11-29 NOTE — Progress Notes (Signed)
[facility] day:  11/30/16     Chief Complaint: Nicole Schmidt is a 51 y.o. female with stage I breast cancer, history of submassive pulmonary embolism, and iron deficiency anemia who is seen for 6 month assessment on Femara.  HPI: The patient was last seen in the medical oncology clinic on 05/29/2016.  At that time, she felt good.  Patient denied breast concerns. She continued to take daily oral iron supplementation with vitamin C. Exam was stable.  She was on Femara and Eliquis with no perceived side effects. CBC revealed white count 13,800 with an [place] of 11,100. Hemoglobin was 11.7, hematocrit 35.9, and platelets 260,000. Electrolytes were normal. Renal function decreased with a BUN of 41 and creatinine of 1.40. Ferritin was 37. CA27.29 was 25.2.   Bilateral diagnostic mammogram on 10/10/2016 revealed no mammographic evidence for malignancy.  During the interim, patient doing well. She denies any physical complaints today. She continues on oral iron supplementation. She is taking her Femara and Eliquis as prescribed. She denied any bleeding. Patient is bruising, however all areas are explainable. She has no breast concerns. She has experienced no B symptoms or interval infections. Patient eating well, with no significant weight loss.   Patient has intermittent vasomotor symptoms. Patient menopausal since 2016. Patient has never had colonoscopy. She has spoken with Dr. Jamal Collin regarding this, with possible plans to have this done prior to him retiring before the end of this year.  She performs monthly self breast examinations.    Past Medical History:  Diagnosis Date  . Anemia   . Arthritis   . Breast cancer of upper-inner quadrant of right female breast ([place])    Right breast invasive CA and DCIS , T1,N0,M0. Er/PR pos, her 2 negative  . Diabetes mellitus without complication ([place])   . Gout   . Gout   . Hypertension   . Menopause    age 76  .  Pulmonary embolism ([place]) 10/2014  . Renal insufficiency   . Thyroid goiter     Past Surgical History:  Procedure Laterality Date  . BREAST BIOPSY Right 2014   breast ca  . BREAST EXCISIONAL BIOPSY Right 2014   breast ca  . BREAST SURGERY Right 2014   with sentinel node bx subareaolar duct excision    Family History  Problem Relation Age of Onset  . Diabetes Father   . Hypertension Father   . Parkinson's disease Father   . Hypertension Mother   . Rheum arthritis Mother   . Atrial fibrillation Mother   . Heart failure Mother   . Colon cancer Cousin 72    Social History:  reports that she has never smoked. She has never used smokeless tobacco. She reports that she does not drink alcohol or use drugs.  Her mother died of a MI.  Her father died in 05/13/2015.  He had Parkinson's disease and dementia.  She is an only child.  She has never married.  The patient is alone today.  Allergies: No Known Allergies  Current Medications: Current Outpatient Prescriptions  Medication Sig Dispense Refill  . allopurinol (ZYLOPRIM) 300 MG tablet Take 300 mg by mouth daily.     Francia Greaves THYROID 60 MG tablet Take 2 tablets by mouth daily.    . Ascorbic Acid (VITAMIN C PO) Take 1 tablet by mouth 2 (two) times daily.     Marland Kitchen atorvastatin (LIPITOR) 20 MG tablet Take 20 mg by mouth daily.    Marland Kitchen  Calcium Carbonate-Vitamin D (CALCIUM 600 + D PO) Take 1 tablet by mouth daily.    Marland Kitchen CINNAMON PO Take 2,000 mg by mouth daily.    . Dulaglutide (TRULICITY) 1.5 FH/5.4TG SOPN Inject into the skin once a week.    Marland Kitchen ELIQUIS 5 MG TABS tablet TAKE 1 TABLET TWICE A DAY 180 tablet 1  . ergocalciferol (VITAMIN D2) 50000 UNITS capsule Take 50,000 Units by mouth once a week. Take on Friday mornings    . ferrous sulfate 325 (65 FE) MG tablet Take 325 mg by mouth 2 (two) times daily with a meal.     . folic acid (FOLVITE) 256 MCG tablet Take 800 mcg by mouth daily.     . furosemide (LASIX) 20 MG tablet Take 20 mg by mouth.     . letrozole (FEMARA) 2.5 MG tablet TAKE 1 TABLET DAILY 90 tablet 1  . lisinopril-hydrochlorothiazide (PRINZIDE,ZESTORETIC) 20-25 MG per tablet Take 1 tablet by mouth daily.    . metFORMIN (GLUCOPHAGE) 1000 MG tablet Take 1 tablet by mouth 2 (two) times daily.    . ONE TOUCH ULTRA TEST test strip     . ONETOUCH DELICA LANCETS FINE MISC     . nystatin (MYCOSTATIN/NYSTOP) 100000 UNIT/GM POWD Apply topically to rash 3 times a day (Patient not taking: Reported on 11/30/2016) 30 g 1   No current facility-administered medications for this visit.     Review of Systems:  GENERAL:  Feels "good".  No fevers or sweats.  Weight down 2 pounds.  PERFORMANCE STATUS (ECOG):  0 HEENT: No visual changes, runny nose, sore throat, mouth sores or tenderness. Lungs: No increased shortness of breath.  No cough.  No hemoptysis. Cardiac:  No chest pain, palpitations, orthopnea, or PND. GI:  No nausea, vomiting, diarrhea, constipation, melena or hematochezia.  Considering colonoscopy. GU:  No urgency, frequency, dysuria, or hematuria. Musculoskeletal:  No back pain.  No joint pain.  No muscle tenderness. Extremities:  No pain or swelling. Skin:  No rashes or skin changes. Neuro:  No headache, numbness or weakness, balance or coordination issues. Endocrine: Diabetes.  No thyroid issues, hot flashes or night sweats. Psych:  No mood changes, depression or anxiety. Pain:  No focal pain. Review of systems:  All other systems reviewed and found to be negative.  Physical Exam: Blood pressure 135/69, pulse 82, temperature 99.1 F (37.3 C), temperature source Tympanic, weight (!) 311 lb (141.1 kg), last menstrual period 07/17/2012. GENERAL:  Well developed, well nourished, heavy set woman sitting comfortably in the exam room in no acute distress. MENTAL STATUS:  Alert and oriented to person, place and time. HEAD:  Shoulder length brown hair.  Normocephalic, atraumatic, face symmetric, no Cushingoid features. EYES:  Blue eyes. Pupils equal round and reactive to light and accomodation. No conjunctivitis or scleral icterus. ENT: Oropharynx clear without lesion. Gums unremarkable. Tongue normal. Mucous membranes moist.  RESPIRATORY: Clear to auscultation without rales, wheezes or rhonchi. CARDIOVASCULAR: Regular rate and rhythm without murmur, rub or gallop. BREASTS:  Right breast with post operative and post radiation changes.  Inferior edema (chronic). Moderate fibrocystic changes .  No discrete masses, skin changes or nipple discharge.  Left breast with fibrocystic changes in the upper outer quadrant.  Inferior fibrocystic changes.  No discrete masses, skin changes or nipple discharge. ABDOMEN: Soft, non-tender, with active bowel sounds, and no appreciable hepatosplenomegaly. No masses. SKIN: No rashes, ulcers or lesions. EXTREMITIES: Chronic lower extremity changes.  No skin discoloration or tenderness. No palpable  cords. LYMPH NODES: No palpable cervical, supraclavicular, axillary or inguinal adenopathy  NEUROLOGICAL: Unremarkable. PSYCH: Appropriate.   Appointment on 11/30/2016  Component Date Value Ref Range Status  . Sodium 11/30/2016 140  135 - 145 mmol/L Final  . Potassium 11/30/2016 4.5  3.5 - 5.1 mmol/L Final  . Chloride 11/30/2016 106  101 - 111 mmol/L Final  . CO2 11/30/2016 25  22 - 32 mmol/L Final  . Glucose, Bld 11/30/2016 107* 65 - 99 mg/dL Final  . BUN 11/30/2016 43* 6 - 20 mg/dL Final  . Creatinine, Ser 11/30/2016 1.15* 0.44 - 1.00 mg/dL Final  . Calcium 11/30/2016 10.7* 8.9 - 10.3 mg/dL Final  . Total Protein 11/30/2016 7.6  6.5 - 8.1 g/dL Final  . Albumin 11/30/2016 4.0  3.5 - 5.0 g/dL Final  . AST 11/30/2016 23  15 - 41 U/L Final  . ALT 11/30/2016 23  14 - 54 U/L Final  . Alkaline Phosphatase 11/30/2016 131* 38 - 126 U/L Final  . Total Bilirubin 11/30/2016 0.5  0.3 - 1.2 mg/dL Final  . GFR calc non Af Amer 11/30/2016 53* >60 mL/min Final  . GFR calc Af Amer  11/30/2016 >60  >60 mL/min Final   Comment: (NOTE) The eGFR has been calculated using the CKD EPI equation. This calculation has not been validated in all clinical situations. eGFR's persistently <60 mL/min signify possible Chronic Kidney Disease.   . Anion gap 11/30/2016 9  5 - 15 Final  . WBC 11/30/2016 10.2  3.6 - 11.0 K/uL Final  . RBC 11/30/2016 3.91  3.80 - 5.20 MIL/uL Final  . Hemoglobin 11/30/2016 11.3* 12.0 - 16.0 g/dL Final  . HCT 11/30/2016 34.9* 35.0 - 47.0 % Final  . MCV 11/30/2016 89.2  80.0 - 100.0 fL Final  . MCH 11/30/2016 28.9  26.0 - 34.0 pg Final  . MCHC 11/30/2016 32.4  32.0 - 36.0 g/dL Final  . RDW 11/30/2016 15.9* 11.5 - 14.5 % Final  . Platelets 11/30/2016 204  150 - 440 K/uL Final  . Neutrophils Relative % 11/30/2016 74  % Final  . Neutro Abs 11/30/2016 7.5* 1.4 - 6.5 K/uL Final  . Lymphocytes Relative 11/30/2016 14  % Final  . Lymphs Abs 11/30/2016 1.5  1.0 - 3.6 K/uL Final  . Monocytes Relative 11/30/2016 7  % Final  . Monocytes Absolute 11/30/2016 0.7  0.2 - 0.9 K/uL Final  . Eosinophils Relative 11/30/2016 5  % Final  . Eosinophils Absolute 11/30/2016 0.5  0 - 0.7 K/uL Final  . Basophils Relative 11/30/2016 0  % Final  . Basophils Absolute 11/30/2016 0.0  0 - 0.1 K/uL Final    Assessment:  SHANNAN SLINKER is a 51 y.o. female with a history of stage I right breast cancer status post wide local incision on 08/12/2012.  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  Sentinel lymph node was negative.  Tumor was ER/PR positive and Her2/neu negative.  Oncotype DX score was 14 (low risk).  She completed breast radiation in 10/2012.  She began tamoxifen in 10/2012, but discontinued it on 10/19/2014 following a submassive PE.    Bilateral diagnostic mammogram on 10/10/2016 revealed no mammographic evidence for malignancy.  CA27.29 has been followed: 21.7 on 04/24/2013, 21.4 on 07/31/2013, 17.9 on 03/09/2014, 20.7 on 01/18/2015, 17.2 on  06/10/2015, 22.5 on 08/13/2015, 16.6 on 11/08/2015, 19.8 on 01/31/2016, 25.2 on 05/29/2016, and 19.3 on 11/30/2016.  She had a bilateral submassive pulmonary emboli on 10/18/2014.  Chest  CT angiogram revealed submassive PE with right heart strain.  She received catheter directed lytic therapy at [facility].  Lower extremity duplex was negative for DVT.  She was on Xarelto (discontinued secondary to oral bleeding).  She began Eliquis on 07/08/2015.  Hypercoagulable work-up revealed the following normal studies:  Factor V Leiden, prothrombin gene mutation, lupus anticoagulant panel, anticardiolipin antibodies, protein S total (127%), protein S activity (99%), protein C total (71%), protein C activity (77%), and ATIII activity (82%).  Bone density study on 01/26/2015 was normal with a T score of -0.3.  She has had no menses since 07/2012.  Labs on 01/18/2015 confirmed a post-menopausal status.  She began Femara on 06/10/2015.  She was noted to have a normocytic anemia on 06/10/2015.  Labs on 07/08/2015 revealed documented iron deficiency.  Ferritin was 25, iron saturation 6%, TIBC 473 (high).  Normal labs included B12, folate, SPEP revealed, LFTs, and guaiac cards x 3.   She is on oral iron with vitamin C.  Ferritin has been followed:  25 on 07/08/2015, 30 on 11/08/2015, 46 on 01/31/2016, and 37 on 05/29/2016.  She was noted to have stage III renal insufficiency on 06/10/2015.  Creatinine was 1.34 (CrCl 45 ml/min) with prior baseline Cr of 0.86 - 0.93. She was seen by nephrology.  Etiology was felt secondary to dehydration.  Creatinine is 1.19 today.  Symptomatically, she feels good. Patient has no complaints. Exam reveals post radiation changes in her RIGHT breast. CBC is normal. Creatinine  Is 1.15. Calcium elevated at 10.7.  Alkaline phosphatase is slightly elevated (131).  Plan: 1.  Labs today:  CBC with diff, CMP, ferritin, CA27.29, PTH. 2.  Review mammogram from 10/10/2016 - no mammographic  evidence for malignancy. 3.  Continue Femara.  4.  Continue Eliquis. 5.  Continue oral iron as previously prescribed  Goal ferritin 100. 6.  Discuss elevated calcium of 10.7 today. Will check PTH today. If high, we will refer to endocrinology for possible primary hyperparathyroidism. 7.  Discuss colonoscopy. Patient has seen Dr. Jamal Collin in consult. She has not decided if she is going to proceed. Patient on anticoagulation therapy, which will need to be held for at least 2 days prior to the procedure. Prior to the anticoagulants being held, discuss need to ultrasoundlower extremities.  8.  RTC in 2 weeks for labs (CMP). 9.  RTC in 6 months for MD assessment and labs (CBC with diff, CMP, ferritin, CA27.29).  Honor Loh, NP  11/30/2016, 3:51 PM   I saw and evaluated the patient, participating in the key portions of the service and reviewing pertinent diagnostic studies and records.  I reviewed the nurse practitioner's note and agree with the findings and the plan.  Multiple questions were asked by the patient and answered.   Nolon Stalls, MD 11/30/2016,3:51 PM

## 2016-11-30 ENCOUNTER — Inpatient Hospital Stay: Payer: BLUE CROSS/BLUE SHIELD | Attending: Hematology and Oncology

## 2016-11-30 ENCOUNTER — Encounter: Payer: Self-pay | Admitting: Hematology and Oncology

## 2016-11-30 ENCOUNTER — Inpatient Hospital Stay (HOSPITAL_BASED_OUTPATIENT_CLINIC_OR_DEPARTMENT_OTHER): Payer: BLUE CROSS/BLUE SHIELD | Admitting: Hematology and Oncology

## 2016-11-30 VITALS — BP 135/69 | HR 82 | Temp 99.1°F | Wt 311.0 lb

## 2016-11-30 DIAGNOSIS — Z923 Personal history of irradiation: Secondary | ICD-10-CM | POA: Insufficient documentation

## 2016-11-30 DIAGNOSIS — C50211 Malignant neoplasm of upper-inner quadrant of right female breast: Secondary | ICD-10-CM

## 2016-11-30 DIAGNOSIS — Z79811 Long term (current) use of aromatase inhibitors: Secondary | ICD-10-CM | POA: Insufficient documentation

## 2016-11-30 DIAGNOSIS — E119 Type 2 diabetes mellitus without complications: Secondary | ICD-10-CM | POA: Diagnosis not present

## 2016-11-30 DIAGNOSIS — I2782 Chronic pulmonary embolism: Secondary | ICD-10-CM

## 2016-11-30 DIAGNOSIS — Z7901 Long term (current) use of anticoagulants: Secondary | ICD-10-CM

## 2016-11-30 DIAGNOSIS — Z7984 Long term (current) use of oral hypoglycemic drugs: Secondary | ICD-10-CM | POA: Insufficient documentation

## 2016-11-30 DIAGNOSIS — Z79899 Other long term (current) drug therapy: Secondary | ICD-10-CM | POA: Insufficient documentation

## 2016-11-30 DIAGNOSIS — C50911 Malignant neoplasm of unspecified site of right female breast: Secondary | ICD-10-CM

## 2016-11-30 DIAGNOSIS — D509 Iron deficiency anemia, unspecified: Secondary | ICD-10-CM

## 2016-11-30 DIAGNOSIS — M109 Gout, unspecified: Secondary | ICD-10-CM | POA: Insufficient documentation

## 2016-11-30 DIAGNOSIS — N289 Disorder of kidney and ureter, unspecified: Secondary | ICD-10-CM | POA: Insufficient documentation

## 2016-11-30 DIAGNOSIS — Z170 Estrogen receptor positive status [ER+]: Secondary | ICD-10-CM | POA: Insufficient documentation

## 2016-11-30 DIAGNOSIS — Z86711 Personal history of pulmonary embolism: Secondary | ICD-10-CM | POA: Diagnosis not present

## 2016-11-30 DIAGNOSIS — I10 Essential (primary) hypertension: Secondary | ICD-10-CM | POA: Insufficient documentation

## 2016-11-30 DIAGNOSIS — R748 Abnormal levels of other serum enzymes: Secondary | ICD-10-CM

## 2016-11-30 LAB — COMPREHENSIVE METABOLIC PANEL
ALT: 23 U/L (ref 14–54)
AST: 23 U/L (ref 15–41)
Albumin: 4 g/dL (ref 3.5–5.0)
Alkaline Phosphatase: 131 U/L — ABNORMAL HIGH (ref 38–126)
Anion gap: 9 (ref 5–15)
BUN: 43 mg/dL — ABNORMAL HIGH (ref 6–20)
CO2: 25 mmol/L (ref 22–32)
Calcium: 10.7 mg/dL — ABNORMAL HIGH (ref 8.9–10.3)
Chloride: 106 mmol/L (ref 101–111)
Creatinine, Ser: 1.15 mg/dL — ABNORMAL HIGH (ref 0.44–1.00)
GFR calc Af Amer: >60 mL/min (ref >60)
GFR calc non Af Amer: 53 mL/min — ABNORMAL LOW (ref >60)
Glucose, Bld: 107 mg/dL — ABNORMAL HIGH (ref 65–99)
Potassium: 4.5 mmol/L (ref 3.5–5.1)
Sodium: 140 mmol/L (ref 135–145)
Total Bilirubin: 0.5 mg/dL (ref 0.3–1.2)
Total Protein: 7.6 g/dL (ref 6.5–8.1)

## 2016-11-30 LAB — CBC WITH DIFFERENTIAL/PLATELET
Basophils Absolute: 0 10*3/uL (ref 0–0.1)
Basophils Relative: 0 %
Eosinophils Absolute: 0.5 10*3/uL (ref 0–0.7)
Eosinophils Relative: 5 %
HCT: 34.9 % — ABNORMAL LOW (ref 35.0–47.0)
Hemoglobin: 11.3 g/dL — ABNORMAL LOW (ref 12.0–16.0)
Lymphocytes Relative: 14 %
Lymphs Abs: 1.5 10*3/uL (ref 1.0–3.6)
MCH: 28.9 pg (ref 26.0–34.0)
MCHC: 32.4 g/dL (ref 32.0–36.0)
MCV: 89.2 fL (ref 80.0–100.0)
Monocytes Absolute: 0.7 10*3/uL (ref 0.2–0.9)
Monocytes Relative: 7 %
Neutro Abs: 7.5 10*3/uL — ABNORMAL HIGH (ref 1.4–6.5)
Neutrophils Relative %: 74 %
Platelets: 204 10*3/uL (ref 150–440)
RBC: 3.91 MIL/uL (ref 3.80–5.20)
RDW: 15.9 % — ABNORMAL HIGH (ref 11.5–14.5)
WBC: 10.2 10*3/uL (ref 3.6–11.0)

## 2016-11-30 NOTE — Progress Notes (Signed)
Patient states that she is feeling well today. She states that she has "everyday aches and pains" but denies having acute pain. She has already received her Flu Vaccine.

## 2016-12-01 LAB — CANCER ANTIGEN 27.29: CA 27.29: 19.3 U/mL (ref 0.0–38.6)

## 2016-12-01 LAB — PARATHYROID HORMONE, INTACT (NO CA): PTH: 15 pg/mL (ref 15–65)

## 2016-12-14 ENCOUNTER — Inpatient Hospital Stay: Payer: BLUE CROSS/BLUE SHIELD | Attending: Hematology and Oncology

## 2016-12-14 DIAGNOSIS — Z170 Estrogen receptor positive status [ER+]: Secondary | ICD-10-CM | POA: Insufficient documentation

## 2016-12-14 DIAGNOSIS — Z79899 Other long term (current) drug therapy: Secondary | ICD-10-CM | POA: Diagnosis not present

## 2016-12-14 DIAGNOSIS — C50211 Malignant neoplasm of upper-inner quadrant of right female breast: Secondary | ICD-10-CM | POA: Diagnosis present

## 2016-12-14 DIAGNOSIS — I10 Essential (primary) hypertension: Secondary | ICD-10-CM | POA: Insufficient documentation

## 2016-12-14 DIAGNOSIS — Z7984 Long term (current) use of oral hypoglycemic drugs: Secondary | ICD-10-CM | POA: Diagnosis not present

## 2016-12-14 DIAGNOSIS — N289 Disorder of kidney and ureter, unspecified: Secondary | ICD-10-CM | POA: Diagnosis not present

## 2016-12-14 DIAGNOSIS — Z923 Personal history of irradiation: Secondary | ICD-10-CM | POA: Insufficient documentation

## 2016-12-14 DIAGNOSIS — E119 Type 2 diabetes mellitus without complications: Secondary | ICD-10-CM | POA: Diagnosis not present

## 2016-12-14 DIAGNOSIS — Z86711 Personal history of pulmonary embolism: Secondary | ICD-10-CM | POA: Diagnosis not present

## 2016-12-14 DIAGNOSIS — Z79811 Long term (current) use of aromatase inhibitors: Secondary | ICD-10-CM | POA: Diagnosis not present

## 2016-12-14 DIAGNOSIS — Z7901 Long term (current) use of anticoagulants: Secondary | ICD-10-CM | POA: Insufficient documentation

## 2016-12-14 DIAGNOSIS — M109 Gout, unspecified: Secondary | ICD-10-CM | POA: Diagnosis not present

## 2016-12-14 LAB — COMPREHENSIVE METABOLIC PANEL
ALT: 32 U/L (ref 14–54)
AST: 26 U/L (ref 15–41)
Albumin: 3.8 g/dL (ref 3.5–5.0)
Alkaline Phosphatase: 130 U/L — ABNORMAL HIGH (ref 38–126)
Anion gap: 7 (ref 5–15)
BUN: 55 mg/dL — ABNORMAL HIGH (ref 6–20)
CO2: 28 mmol/L (ref 22–32)
Calcium: 9.7 mg/dL (ref 8.9–10.3)
Chloride: 109 mmol/L (ref 101–111)
Creatinine, Ser: 1.18 mg/dL — ABNORMAL HIGH (ref 0.44–1.00)
GFR calc Af Amer: 60 mL/min — ABNORMAL LOW (ref >60)
GFR calc non Af Amer: 52 mL/min — ABNORMAL LOW (ref >60)
Glucose, Bld: 97 mg/dL (ref 65–99)
Potassium: 4.4 mmol/L (ref 3.5–5.1)
Sodium: 144 mmol/L (ref 135–145)
Total Bilirubin: 0.3 mg/dL (ref 0.3–1.2)
Total Protein: 7.5 g/dL (ref 6.5–8.1)

## 2016-12-25 ENCOUNTER — Telehealth: Payer: Self-pay | Admitting: *Deleted

## 2016-12-25 MED ORDER — POLYETHYLENE GLYCOL 3350 17 GM/SCOOP PO POWD: ORAL | 0 refills | Status: DC

## 2016-12-25 NOTE — Telephone Encounter (Signed)
Patient called the office wanting to schedule her colonoscopy.   Patient has been scheduled for a colonoscopy on 01-30-17 at [facility]. Miralax prescription has been sent in to the patient's pharmacy today. Colonoscopy instructions have been reviewed with the patient previously. This patient has been asked to hold metformin day of colonoscopy prep and procedure.  We will check with Dr. Jamal Collin about when to stop Eliquis and call patient on home number as requested. This patient is aware to call the office if they have further questions.

## 2016-12-29 ENCOUNTER — Other Ambulatory Visit: Payer: Self-pay | Admitting: Hematology and Oncology

## 2017-01-03 ENCOUNTER — Other Ambulatory Visit: Payer: Self-pay | Admitting: General Surgery

## 2017-01-03 DIAGNOSIS — Z1211 Encounter for screening for malignant neoplasm of colon: Secondary | ICD-10-CM

## 2017-01-08 NOTE — Telephone Encounter (Signed)
Message left on home number that patient will need to stop Eliquis 3 days prior to colonoscopy.   Colonoscopy is currently scheduled for 01-30-17. She will take her last dose of Eliquis on 01-26-17. Hold from 01-27-17 until Dr. Jamal Collin instructs her to resume after colonoscopy.

## 2017-01-29 ENCOUNTER — Encounter: Payer: Self-pay | Admitting: *Deleted

## 2017-01-30 ENCOUNTER — Ambulatory Visit: Payer: BLUE CROSS/BLUE SHIELD | Admitting: Anesthesiology

## 2017-01-30 ENCOUNTER — Encounter
Admission: RE | Disposition: A | Discharge status: Home / Self Care | Payer: Self-pay | Source: Ambulatory Visit | Attending: General Surgery

## 2017-01-30 ENCOUNTER — Ambulatory Visit
Admission: RE | Admit: 2017-01-30 | Discharge: 2017-01-30 | Disposition: A | Discharge status: Home / Self Care | Payer: BLUE CROSS/BLUE SHIELD | Source: Ambulatory Visit | Attending: General Surgery | Admitting: General Surgery

## 2017-01-30 ENCOUNTER — Encounter: Payer: Self-pay | Admitting: *Deleted

## 2017-01-30 DIAGNOSIS — E669 Obesity, unspecified: Secondary | ICD-10-CM | POA: Insufficient documentation

## 2017-01-30 DIAGNOSIS — E119 Type 2 diabetes mellitus without complications: Secondary | ICD-10-CM | POA: Insufficient documentation

## 2017-01-30 DIAGNOSIS — Z79899 Other long term (current) drug therapy: Secondary | ICD-10-CM | POA: Diagnosis not present

## 2017-01-30 DIAGNOSIS — N289 Disorder of kidney and ureter, unspecified: Secondary | ICD-10-CM | POA: Diagnosis not present

## 2017-01-30 DIAGNOSIS — M109 Gout, unspecified: Secondary | ICD-10-CM | POA: Diagnosis not present

## 2017-01-30 DIAGNOSIS — D649 Anemia, unspecified: Secondary | ICD-10-CM | POA: Insufficient documentation

## 2017-01-30 DIAGNOSIS — Z86711 Personal history of pulmonary embolism: Secondary | ICD-10-CM | POA: Insufficient documentation

## 2017-01-30 DIAGNOSIS — M1990 Unspecified osteoarthritis, unspecified site: Secondary | ICD-10-CM | POA: Diagnosis not present

## 2017-01-30 DIAGNOSIS — Z1211 Encounter for screening for malignant neoplasm of colon: Secondary | ICD-10-CM | POA: Diagnosis not present

## 2017-01-30 DIAGNOSIS — I10 Essential (primary) hypertension: Secondary | ICD-10-CM | POA: Diagnosis not present

## 2017-01-30 DIAGNOSIS — Z853 Personal history of malignant neoplasm of breast: Secondary | ICD-10-CM | POA: Diagnosis not present

## 2017-01-30 HISTORY — PX: COLONOSCOPY WITH PROPOFOL: SHX5780

## 2017-01-30 LAB — GLUCOSE, CAPILLARY: Glucose-Capillary: 70 mg/dL (ref 65–99)

## 2017-01-30 SURGERY — COLONOSCOPY WITH PROPOFOL
Anesthesia: General

## 2017-01-30 MED ORDER — PROPOFOL 10 MG/ML IV BOLUS
INTRAVENOUS | Status: DC | PRN
  Administered 2017-01-30: 40 mg via INTRAVENOUS
  Administered 2017-01-30: 30 mg via INTRAVENOUS
  Administered 2017-01-30: 40 mg via INTRAVENOUS

## 2017-01-30 MED ORDER — PROPOFOL 500 MG/50ML IV EMUL
INTRAVENOUS | Status: AC
  Filled 2017-01-30: qty 50

## 2017-01-30 MED ORDER — DEXTROSE-NACL 5-0.9 % IV SOLN: INTRAVENOUS | Status: DC

## 2017-01-30 MED ORDER — DEXTROSE-NACL 5-0.9 % IV SOLN
INTRAVENOUS | Status: DC | PRN
  Administered 2017-01-30: 15:00:00 via INTRAVENOUS

## 2017-01-30 MED ORDER — PROPOFOL 10 MG/ML IV BOLUS
INTRAVENOUS | Status: AC
  Filled 2017-01-30: qty 20

## 2017-01-30 MED ORDER — LIDOCAINE HCL (CARDIAC) 20 MG/ML IV SOLN
INTRAVENOUS | Status: DC | PRN
  Administered 2017-01-30: 100 mg via INTRAVENOUS

## 2017-01-30 MED ORDER — SODIUM CHLORIDE 0.9 % IV SOLN
INTRAVENOUS | Status: DC
  Administered 2017-01-30: 1000 mL via INTRAVENOUS

## 2017-01-30 MED ORDER — LIDOCAINE HCL (PF) 2 % IJ SOLN
INTRAMUSCULAR | Status: AC
  Filled 2017-01-30: qty 10

## 2017-01-30 MED ORDER — PROPOFOL 500 MG/50ML IV EMUL
INTRAVENOUS | Status: DC | PRN
  Administered 2017-01-30: 160 ug/kg/min via INTRAVENOUS

## 2017-01-30 NOTE — Anesthesia Post-op Follow-up Note (Signed)
Anesthesia QCDR form completed.        

## 2017-01-30 NOTE — Anesthesia Preprocedure Evaluation (Addendum)
Anesthesia Evaluation  Patient identified by MRN, date of birth, ID band Patient awake    Reviewed: Allergy & Precautions, NPO status , Patient's Chart, lab work & pertinent test results, reviewed documented beta blocker date and time   Airway Mallampati: III  TM Distance: >3 FB     Dental  (+) Chipped   Pulmonary           Cardiovascular hypertension, Pt. on medications      Neuro/Psych    GI/Hepatic   Endo/Other  diabetes, Type 2  Renal/GU Renal disease     Musculoskeletal  (+) Arthritis ,   Abdominal   Peds  Hematology  (+) anemia ,   Anesthesia Other Findings Gout. Hx PE. Obese.  Reproductive/Obstetrics                            Anesthesia Physical Anesthesia Plan  ASA: III  Anesthesia Plan: General   Post-op Pain Management:    Induction: Intravenous  PONV Risk Score and Plan:   Airway Management Planned:   Additional Equipment:   Intra-op Plan:   Post-operative Plan:   Informed Consent: I have reviewed the patients History and Physical, chart, labs and discussed the procedure including the risks, benefits and alternatives for the proposed anesthesia with the patient or authorized representative who has indicated his/her understanding and acceptance.     Plan Discussed with: CRNA  Anesthesia Plan Comments:         Anesthesia Quick Evaluation

## 2017-01-30 NOTE — Anesthesia Procedure Notes (Signed)
Date/Time: 01/30/2017 2:43 PM Performed by: Johnna Acosta, CRNA Pre-anesthesia Checklist: Emergency Drugs available, Suction available, Patient being monitored, Timeout performed and Patient identified Patient Re-evaluated:Patient Re-evaluated prior to induction Oxygen Delivery Method: Nasal cannula Preoxygenation: Pre-oxygenation with 100% oxygen

## 2017-01-30 NOTE — Op Note (Signed)
[facility] Gastroenterology Patient Name: Nicole Schmidt Procedure Date: 01/30/2017 2:32 PM MRN: [id number] Account #: [id number] Date of Birth: 12-05-1965 Admit Type: Outpatient Age: 51 Room: [facility] ENDO ROOM 1 Gender: Female Note Status: Finalized Procedure:            Colonoscopy Indications:          Screening for colorectal malignant neoplasm Providers:            Seeplaputhur G. Jamal Collin, MD Medicines:            General Anesthesia Complications:        No immediate complications. Procedure:            Pre-Anesthesia Assessment:                       - General anesthesia under the supervision of an                        anesthesiologist was determined to be medically                        necessary for this procedure based on review of the                        patient's medical history, medications, and prior                        anesthesia history.                       After obtaining informed consent, the colonoscope was                        passed under direct vision. Throughout the procedure,                        the patient's blood pressure, pulse, and oxygen                        saturations were monitored continuously. The                        Colonoscope was introduced through the anus and                        advanced to the the cecum, identified by the ileocecal                        valve. The colonoscopy was performed without                        difficulty. The patient tolerated the procedure well.                        The quality of the bowel preparation was good. Findings:      The perianal and digital rectal examinations were normal.      The entire examined colon appeared normal on direct and retroflexion       views. Impression:           - The entire examined colon is normal on direct and  retroflexion views.                       - No specimens collected. Recommendation:       - Discharge patient  to home.                       - Resume previous diet.                       - Continue present medications.                       - Repeat colonoscopy in 10 years for screening purposes. Procedure Code(s):    --- Professional ---                       [phone], Colonoscopy, flexible; diagnostic, including                        collection of specimen(s) by brushing or washing, when                        performed (separate procedure) Diagnosis Code(s):    --- Professional ---                       Z12.11, Encounter for screening for malignant neoplasm                        of colon CPT copyright 2016 American Medical Association. All rights reserved. The codes documented in this report are preliminary and upon coder review may  be revised to meet current compliance requirements. Christene Lye, MD 01/30/2017 3:14:17 PM This report has been signed electronically. Number of Addenda: 0 Note Initiated On: 01/30/2017 2:32 PM Scope Withdrawal Time: 0 hours 4 minutes 1 second  Total Procedure Duration: 0 hours 24 minutes 0 seconds       [facility]

## 2017-01-30 NOTE — Transfer of Care (Signed)
Immediate Anesthesia Transfer of Care Note  Patient: CHALONDA SCHLATTER  Procedure(s) Performed: COLONOSCOPY WITH PROPOFOL (N/A )  Patient Location: PACU  Anesthesia Type:General  Level of Consciousness: sedated  Airway & Oxygen Therapy: Patient Spontanous Breathing and Patient connected to nasal cannula oxygen  Post-op Assessment: Report given to RN and Post -op Vital signs reviewed and stable  Post vital signs: Reviewed and stable  Last Vitals:  Vitals:   01/30/17 1327  BP: (!) 143/68  Pulse: 87  Resp: 20  Temp: 37.6 C  SpO2: 98%    Last Pain:  Vitals:   01/30/17 1327  TempSrc: Tympanic      Patients Stated Pain Goal: 0 (48/40/39 [id number])  Complications: No apparent anesthesia complications

## 2017-01-30 NOTE — Anesthesia Procedure Notes (Signed)
Procedures

## 2017-01-31 ENCOUNTER — Encounter: Payer: Self-pay | Admitting: General Surgery

## 2017-01-31 NOTE — Interval H&P Note (Signed)
History and Physical Interval Note:  01/31/2017 12:46 PM  Nicole Schmidt  has presented today for surgery, with the diagnosis of SCREENING  The various methods of treatment have been discussed with the patient and family. After consideration of risks, benefits and other options for treatment, the patient has consented to  Procedure(s): COLONOSCOPY WITH PROPOFOL (N/A) as a surgical intervention .  The patient's history has been reviewed, patient examined, no change in status, stable for surgery.  I have reviewed the patient's chart and labs.  Questions were answered to the patient's satisfaction.     Cicero Noy G

## 2017-01-31 NOTE — Interval H&P Note (Signed)
History and Physical Interval Note:  01/31/2017 12:47 PM  Nicole Schmidt  has presented today for surgery, with the diagnosis of SCREENING  The various methods of treatment have been discussed with the patient and family. After consideration of risks, benefits and other options for treatment, the patient has consented to  Procedure(s): COLONOSCOPY WITH PROPOFOL (N/A) as a surgical intervention .  The patient's history has been reviewed, patient examined, no change in status, stable for surgery.  I have reviewed the patient's chart and labs.  Questions were answered to the patient's satisfaction.     Korbyn Vanes G

## 2017-01-31 NOTE — H&P (Signed)
Nicole Schmidt is an 51 y.o. female.   Chief Complaint: Here for screening colonoscopy HPI: 51 year old female 25 years post treatment for right breast stage I cancer.  She is due for her first screening colonoscopy.  Patient with no GI complaints last seen in October of this year for her routine breast follow-up and exam was normal at the time.  No interval changes  Past Medical History:  Diagnosis Date  . Anemia   . Arthritis   . Breast cancer of upper-inner quadrant of right female breast ([place])    Right breast invasive CA and DCIS , T1,N0,M0. Er/PR pos, her 2 negative  . Diabetes mellitus without complication ([place])   . Gout   . Gout   . Hypertension   . Menopause    age 23  . Pulmonary embolism ([place]) 10/2014  . Renal insufficiency   . Thyroid goiter     Past Surgical History:  Procedure Laterality Date  . BREAST BIOPSY Right 2014   breast ca  . BREAST EXCISIONAL BIOPSY Right 2014   breast ca  . BREAST SURGERY Right 2014   with sentinel node bx subareaolar duct excision  . COLONOSCOPY WITH PROPOFOL N/A 01/30/2017   Procedure: COLONOSCOPY WITH PROPOFOL;  Surgeon: Christene Lye, MD;  Location: [facility] ENDOSCOPY;  Service: Endoscopy;  Laterality: N/A;    Family History  Problem Relation Age of Onset  . Diabetes Father   . Hypertension Father   . Parkinson's disease Father   . Hypertension Mother   . Rheum arthritis Mother   . Atrial fibrillation Mother   . Heart failure Mother   . Colon cancer Cousin 16   Social History:  reports that  has never smoked. she has never used smokeless tobacco. She reports that she does not drink alcohol or use drugs.  Allergies: No Known Allergies  No medications prior to admission.    Results for orders placed or performed during the hospital encounter of 01/30/17 (from the past 48 hour(s))  Glucose, capillary     Status: None   Collection Time: 01/30/17  2:25 PM  Result Value Ref Range   Glucose-Capillary 70 65 - 99 mg/dL    No results found.  Review of Systems  Constitutional: Negative.   Respiratory: Negative.   Cardiovascular: Negative.   Gastrointestinal: Negative.   Genitourinary: Negative.     Blood pressure (!) 188/91, pulse 88, temperature 98 F (36.7 C), temperature source Tympanic, resp. rate 16, height 5\' 3"  (1.6 m), weight (!) 314 lb (142.4 kg), last menstrual period 07/17/2012, SpO2 98 %. Physical Exam  Constitutional: She is oriented to person, place, and time. She appears well-developed and well-nourished.  Eyes: Conjunctivae are normal. No scleral icterus.  Neck: Neck supple. No thyromegaly present.  Cardiovascular: Normal rate, regular rhythm and normal heart sounds.  Respiratory: Effort normal and breath sounds normal.  GI: Soft. Bowel sounds are normal. She exhibits no distension and no mass. There is no tenderness.  Lymphadenopathy:    She has no cervical adenopathy.  Neurological: She is alert and oriented to person, place, and time.  Skin: Skin is warm and dry.     Assessment/Plan Okay to proceed with colonoscopy as scheduled  Christene Lye, MD 01/31/2017, 12:43 PM

## 2017-01-31 NOTE — Anesthesia Postprocedure Evaluation (Signed)
Anesthesia Post Note  Patient: Nicole Schmidt  Procedure(s) Performed: COLONOSCOPY WITH PROPOFOL (N/A )  Patient location during evaluation: Endoscopy Anesthesia Type: General Level of consciousness: awake and alert Pain management: pain level controlled Vital Signs Assessment: post-procedure vital signs reviewed and stable Respiratory status: spontaneous breathing, nonlabored ventilation, respiratory function stable and patient connected to nasal cannula oxygen Cardiovascular status: blood pressure returned to baseline and stable Postop Assessment: no apparent nausea or vomiting Anesthetic complications: no     Last Vitals:  Vitals:   01/30/17 1520 01/30/17 1530  BP: 123/67 (!) 188/91  Pulse:  88  Resp:  16  Temp: 36.7 C   SpO2:      Last Pain:  Vitals:   01/30/17 1520  TempSrc: Tympanic                 [name] S

## 2017-02-26 IMAGING — US US RENAL
1 series · 14 of 25 positions shown · non-contrast
Comparison: None.

CLINICAL DATA: Known pulmonary embolism and hypertension

EXAM:
RENAL / URINARY TRACT ULTRASOUND COMPLETE

[Series 1: us renal · 0.27mm/px · 14 of 34 slices shown]
[im 1/34]
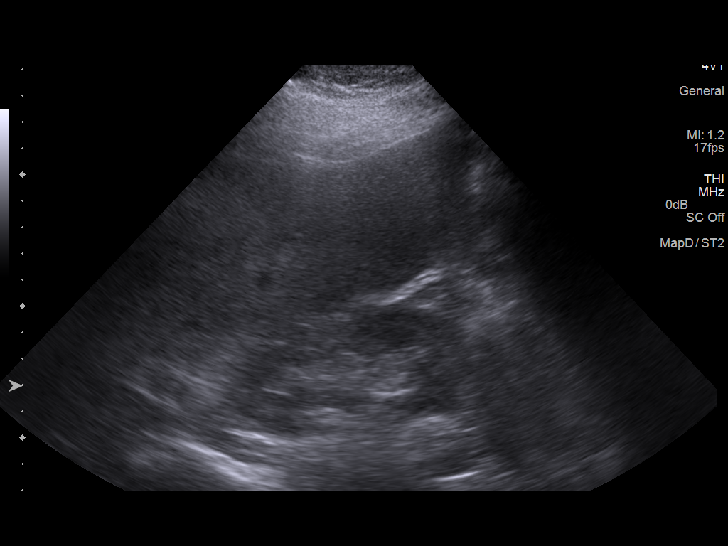
[im 3/34]
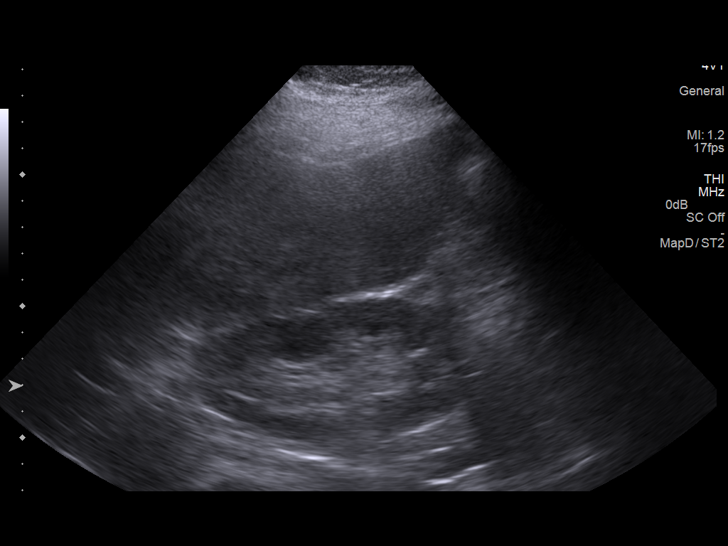
[im 6/34]
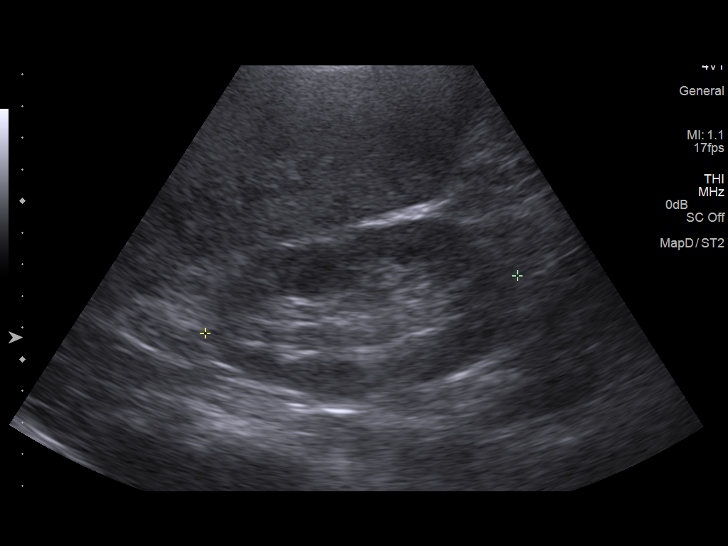
[im 9/34]
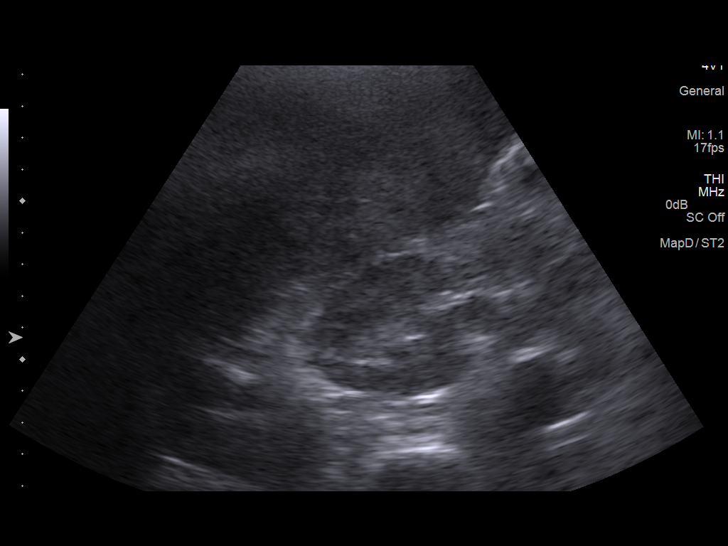
[im 12/34]
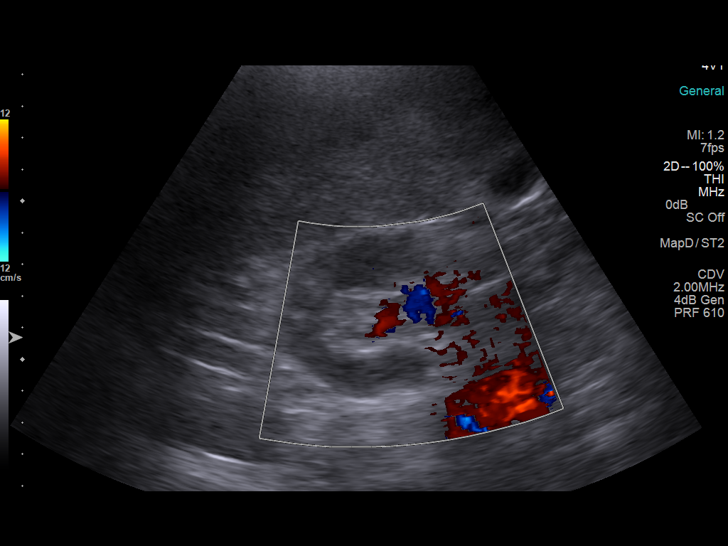
[im 13/34]
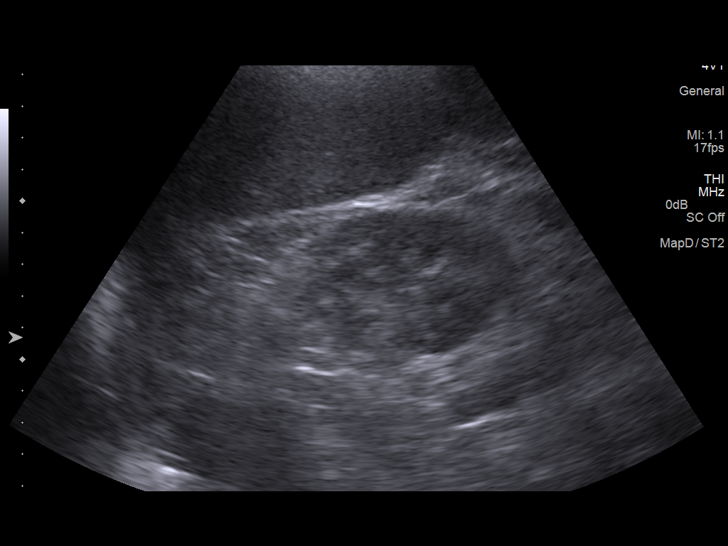
[im 16/34]
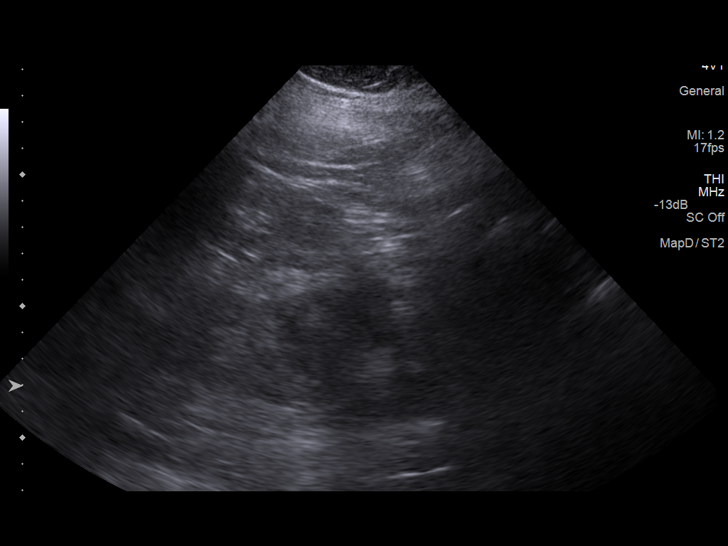
[im 18/34]
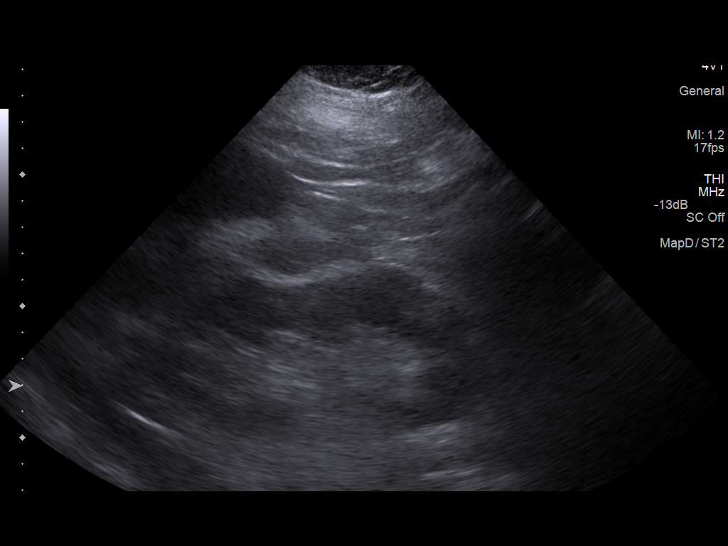
[im 21/34]
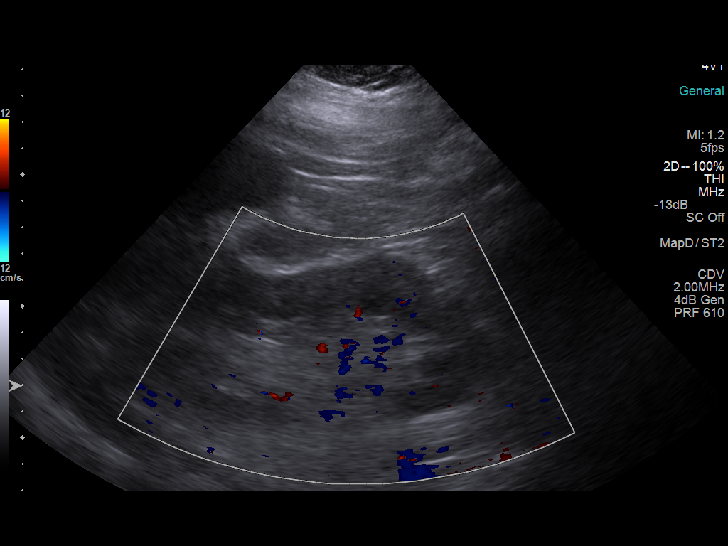
[im 23/34]
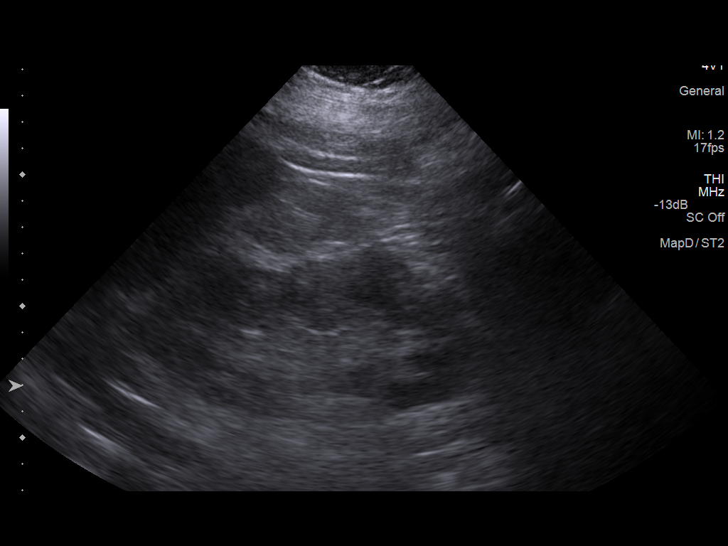
[im 25/34]
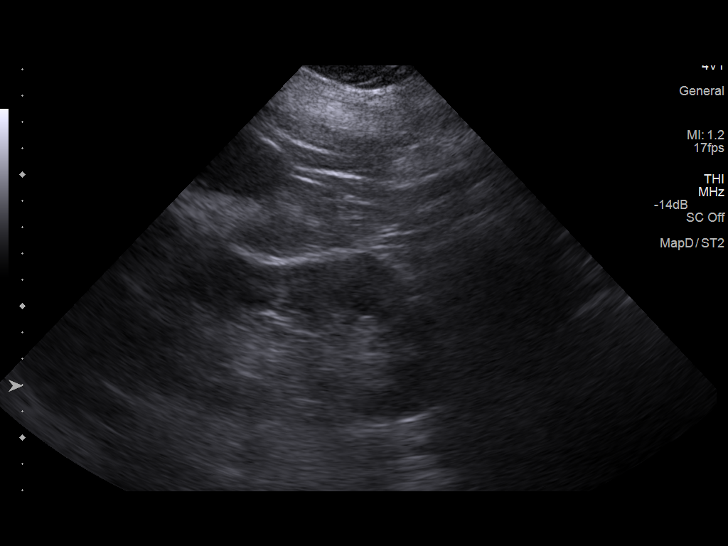
[im 28/34]
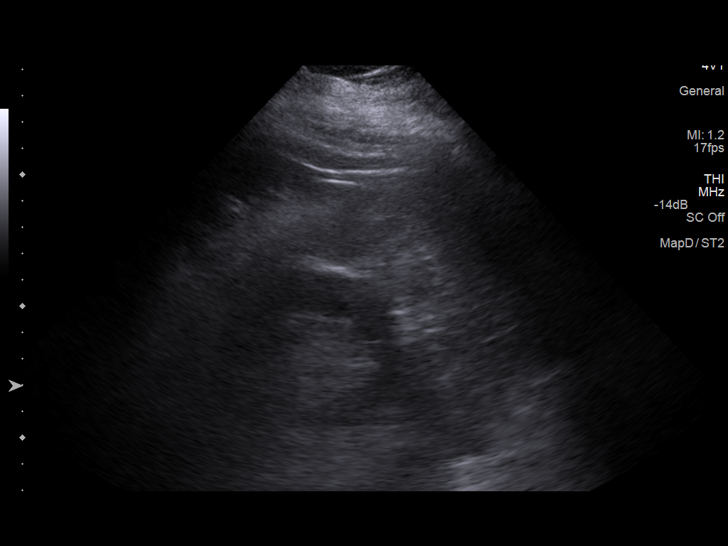
[im 31/34]
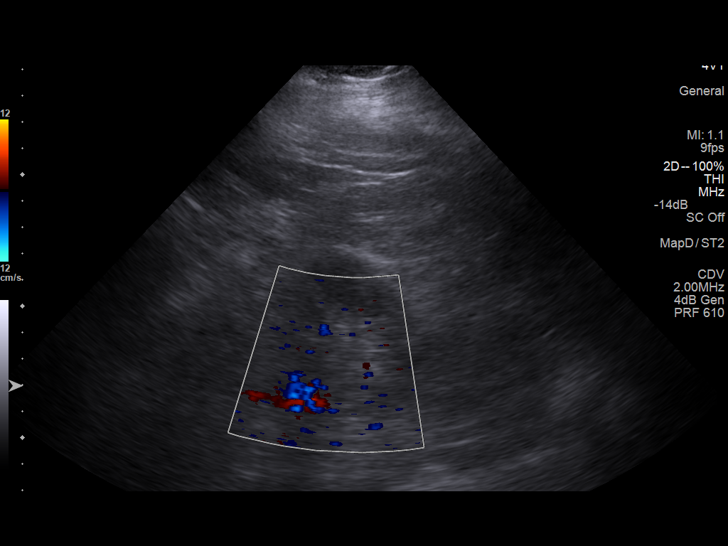
[im 34/34]
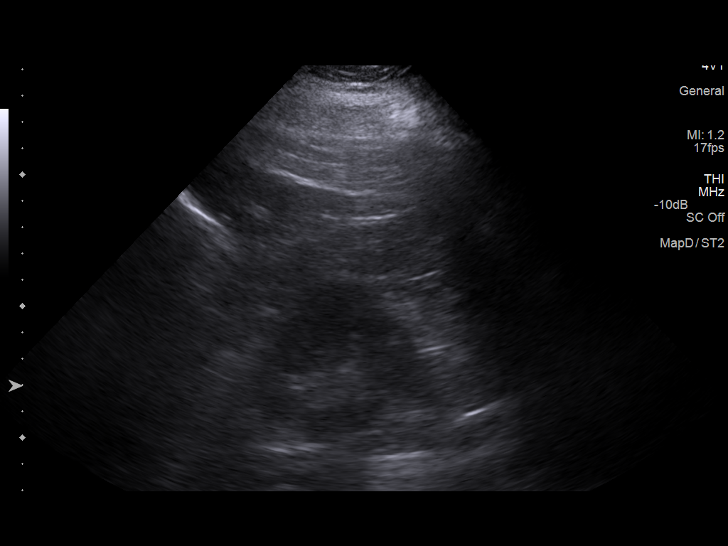

[14 of 25 positions shown; findings below may reference images not displayed]

FINDINGS: Right Kidney:

Length: 10.0 cm.. Echogenicity within normal limits. No mass or
hydronephrosis visualized. Normal vascular flow is noted.

Left Kidney:

Length: 13.3 cm.. Echogenicity within normal limits. No mass or
hydronephrosis visualized. Normal vascular flow is noted.

Bladder:

Appears normal for degree of bladder distention.
IMPRESSION: Normal-appearing kidneys. Normal vascularity is noted on this
limited exam. This does not constitute a full renal Doppler
examination.

## 2017-05-14 ENCOUNTER — Encounter: Payer: Self-pay | Admitting: Hematology and Oncology

## 2017-05-14 ENCOUNTER — Inpatient Hospital Stay: Payer: BLUE CROSS/BLUE SHIELD | Attending: Hematology and Oncology | Admitting: Hematology and Oncology

## 2017-05-14 ENCOUNTER — Inpatient Hospital Stay: Payer: BLUE CROSS/BLUE SHIELD

## 2017-05-14 VITALS — BP 152/87 | HR 84 | Temp 98.2°F | Resp 18 | Wt 315.0 lb

## 2017-05-14 DIAGNOSIS — N183 Chronic kidney disease, stage 3 (moderate): Secondary | ICD-10-CM | POA: Diagnosis not present

## 2017-05-14 DIAGNOSIS — Z86711 Personal history of pulmonary embolism: Secondary | ICD-10-CM | POA: Diagnosis not present

## 2017-05-14 DIAGNOSIS — Z780 Asymptomatic menopausal state: Secondary | ICD-10-CM

## 2017-05-14 DIAGNOSIS — G20 Parkinson's disease: Secondary | ICD-10-CM | POA: Diagnosis not present

## 2017-05-14 DIAGNOSIS — Z170 Estrogen receptor positive status [ER+]: Secondary | ICD-10-CM | POA: Insufficient documentation

## 2017-05-14 DIAGNOSIS — C50911 Malignant neoplasm of unspecified site of right female breast: Secondary | ICD-10-CM | POA: Diagnosis present

## 2017-05-14 DIAGNOSIS — D509 Iron deficiency anemia, unspecified: Secondary | ICD-10-CM

## 2017-05-14 DIAGNOSIS — I2782 Chronic pulmonary embolism: Secondary | ICD-10-CM

## 2017-05-14 DIAGNOSIS — Z79811 Long term (current) use of aromatase inhibitors: Secondary | ICD-10-CM | POA: Insufficient documentation

## 2017-05-14 DIAGNOSIS — Z923 Personal history of irradiation: Secondary | ICD-10-CM

## 2017-05-14 DIAGNOSIS — R748 Abnormal levels of other serum enzymes: Secondary | ICD-10-CM

## 2017-05-14 DIAGNOSIS — Z7189 Other specified counseling: Secondary | ICD-10-CM

## 2017-05-14 DIAGNOSIS — D649 Anemia, unspecified: Secondary | ICD-10-CM

## 2017-05-14 DIAGNOSIS — Z7901 Long term (current) use of anticoagulants: Secondary | ICD-10-CM | POA: Insufficient documentation

## 2017-05-14 LAB — COMPREHENSIVE METABOLIC PANEL
ALT: 13 U/L — ABNORMAL LOW (ref 14–54)
AST: 21 U/L (ref 15–41)
Albumin: 4.2 g/dL (ref 3.5–5.0)
Alkaline Phosphatase: 148 U/L — ABNORMAL HIGH (ref 38–126)
Anion gap: 10 (ref 5–15)
BUN: 34 mg/dL — ABNORMAL HIGH (ref 6–20)
CO2: 21 mmol/L — ABNORMAL LOW (ref 22–32)
Calcium: 9.9 mg/dL (ref 8.9–10.3)
Chloride: 109 mmol/L (ref 101–111)
Creatinine, Ser: 1.39 mg/dL — ABNORMAL HIGH (ref 0.44–1.00)
GFR calc Af Amer: 49 mL/min — ABNORMAL LOW (ref >60)
GFR calc non Af Amer: 42 mL/min — ABNORMAL LOW (ref >60)
Glucose, Bld: 101 mg/dL — ABNORMAL HIGH (ref 65–99)
Potassium: 4.1 mmol/L (ref 3.5–5.1)
Sodium: 140 mmol/L (ref 135–145)
Total Bilirubin: 0.4 mg/dL (ref 0.3–1.2)
Total Protein: 7.8 g/dL (ref 6.5–8.1)

## 2017-05-14 LAB — CBC WITH DIFFERENTIAL/PLATELET
Basophils Absolute: 0.1 10*3/uL (ref 0–0.1)
Basophils Relative: 1 %
Eosinophils Absolute: 0.2 10*3/uL (ref 0–0.7)
Eosinophils Relative: 2 %
HCT: 36.2 % (ref 35.0–47.0)
Hemoglobin: 11.7 g/dL — ABNORMAL LOW (ref 12.0–16.0)
Lymphocytes Relative: 10 %
Lymphs Abs: 1.1 10*3/uL (ref 1.0–3.6)
MCH: 29 pg (ref 26.0–34.0)
MCHC: 32.1 g/dL (ref 32.0–36.0)
MCV: 90.1 fL (ref 80.0–100.0)
Monocytes Absolute: 0.7 10*3/uL (ref 0.2–0.9)
Monocytes Relative: 6 %
Neutro Abs: 9.5 10*3/uL — ABNORMAL HIGH (ref 1.4–6.5)
Neutrophils Relative %: 81 %
Platelets: 231 10*3/uL (ref 150–440)
RBC: 4.02 MIL/uL (ref 3.80–5.20)
RDW: 15.5 % — ABNORMAL HIGH (ref 11.5–14.5)
WBC: 11.6 10*3/uL — ABNORMAL HIGH (ref 3.6–11.0)

## 2017-05-14 LAB — FERRITIN: Ferritin: 53 ng/mL (ref 11–307)

## 2017-05-14 NOTE — Progress Notes (Signed)
[facility] day:  05/14/17     Chief Complaint: Nicole Schmidt is a 52 y.o. female with stage I breast cancer, history of submassive pulmonary embolism, and iron deficiency anemia who is seen for 6 month assessment on Femara.  HPI: The patient was last seen in the medical oncology clinic on 11/30/2016.  At that time, she felt good.  Patient had no complaints. Exam revealed post radiation changes in her RIGHT breast. CBC was normal. Creatinine was1.15. Calcium was elevated at 10.7.  Alkaline phosphatase was slightly elevated (131).  She continued Femara, Eliquis and oral iron.  During the interim, patient is doing well overall. She denies any breast concerns. She continues on the Femara as prescribed. Patient has had no B symptoms or interval infections. Patient complains of joint pains. She states, "I stand on concrete floors all of the time. I have been doing it for 35 years of my life. Things are starting to hurt more".   Patient is eating well. Her weight is up 4 pounds. Patient denies pain in the clinic today.   She continues on her apixaban as previously prescribed. Patient denies any bruising or bleeding.  Patient performs monthly self breast examinations as recommended.    Past Medical History:  Diagnosis Date  . Anemia   . Arthritis   . Breast cancer of upper-inner quadrant of right female breast ([place])    Right breast invasive CA and DCIS , T1,N0,M0. Er/PR pos, her 2 negative  . Diabetes mellitus without complication ([place])   . Gout   . Gout   . Hypertension   . Menopause    age 77  . Pulmonary embolism ([place]) 10/2014  . Renal insufficiency   . Thyroid goiter     Past Surgical History:  Procedure Laterality Date  . BREAST BIOPSY Right 2014   breast ca  . BREAST EXCISIONAL BIOPSY Right 2014   breast ca  . BREAST SURGERY Right 2014   with sentinel node bx subareaolar duct excision  . COLONOSCOPY WITH PROPOFOL N/A 01/30/2017    Procedure: COLONOSCOPY WITH PROPOFOL;  Surgeon: Christene Lye, MD;  Location: [facility] ENDOSCOPY;  Service: Endoscopy;  Laterality: N/A;    Family History  Problem Relation Age of Onset  . Diabetes Father   . Hypertension Father   . Parkinson's disease Father   . Hypertension Mother   . Rheum arthritis Mother   . Atrial fibrillation Mother   . Heart failure Mother   . Colon cancer Cousin 74    Social History:  reports that she has never smoked. She has never used smokeless tobacco. She reports that she does not drink alcohol or use drugs.  Her mother died of a MI.  Her father died in 31-May-2015.  He had Parkinson's disease and dementia.  She is an only child.  She has never married.  The patient is alone today.  Allergies: No Known Allergies  Current Medications: Current Outpatient Medications  Medication Sig Dispense Refill  . allopurinol (ZYLOPRIM) 300 MG tablet Take 300 mg by mouth daily.     . Ascorbic Acid (VITAMIN C PO) Take 1 tablet by mouth 2 (two) times daily.     Marland Kitchen atorvastatin (LIPITOR) 20 MG tablet Take 20 mg by mouth daily.    Marland Kitchen CINNAMON PO Take 2,000 mg by mouth daily.    . Dulaglutide (TRULICITY) 1.5 YI/9.4WN SOPN Inject into the skin once a week.    Marland Kitchen ELIQUIS  5 MG TABS tablet TAKE 1 TABLET TWICE A DAY 180 tablet 1  . ergocalciferol (VITAMIN D2) 50000 UNITS capsule Take 50,000 Units by mouth once a week. Take on Friday mornings    . ferrous sulfate 325 (65 FE) MG tablet Take 325 mg by mouth 2 (two) times daily with a meal.     . folic acid (FOLVITE) 428 MCG tablet Take 800 mcg by mouth daily.     . furosemide (LASIX) 20 MG tablet Take 20 mg by mouth.    . letrozole (FEMARA) 2.5 MG tablet TAKE 1 TABLET DAILY 90 tablet 1  . levothyroxine (SYNTHROID, LEVOTHROID) 112 MCG tablet Take 112 mcg daily before breakfast by mouth.    Marland Kitchen lisinopril-hydrochlorothiazide (PRINZIDE,ZESTORETIC) 20-25 MG per tablet Take 1 tablet by mouth daily.    . metFORMIN (GLUCOPHAGE) 1000 MG  tablet Take 1 tablet by mouth 2 (two) times daily.    Marland Kitchen nystatin (MYCOSTATIN/NYSTOP) 100000 UNIT/GM POWD Apply topically to rash 3 times a day 30 g 1  . ONE TOUCH ULTRA TEST test strip     . ONETOUCH DELICA LANCETS FINE MISC     . Calcium Carbonate-Vitamin D (CALCIUM 600 + D PO) Take 1 tablet by mouth daily.     No current facility-administered medications for this visit.     Review of Systems:  GENERAL:  Feels "good".  "Hanging in there".  No fevers or sweats.  Weight up 4 pounds.  PERFORMANCE STATUS (ECOG):  0 HEENT: No visual changes, runny nose, sore throat, mouth sores or tenderness. Lungs: No increased shortness of breath.  No cough.  No hemoptysis. Cardiac:  No chest pain, palpitations, orthopnea, or PND. GI:  No nausea, vomiting, diarrhea, constipation, melena or hematochezia.  Considering colonoscopy. GU:  No urgency, frequency, dysuria, or hematuria. Musculoskeletal: Diffuse join pains attributed to prolonged standing on concrete flooring. No back pain.  No muscle tenderness. Extremities:  No pain or swelling. Skin:  No rashes or skin changes. Neuro:  No headache, numbness or weakness, balance or coordination issues. Endocrine: Diabetes.  No thyroid issues, hot flashes or night sweats. Psych:  No mood changes, depression or anxiety. Pain:  No focal pain. Review of systems:  All other systems reviewed and found to be negative.  Physical Exam: Blood pressure (!) 152/87, pulse 84, temperature 98.2 F (36.8 C), temperature source Tympanic, resp. rate 18, weight (!) 315 lb (142.9 kg), last menstrual period 07/17/2012. GENERAL:  Well developed, well nourished, heavy set woman sitting comfortably in the exam room in no acute distress. MENTAL STATUS:  Alert and oriented to person, place and time. HEAD:  Shoulder length brown hair.  Normocephalic, atraumatic, face symmetric, no Cushingoid features. EYES: Blue eyes. Pupils equal round and reactive to light and accomodation. No  conjunctivitis or scleral icterus. ENT: Oropharynx clear without lesion. Gums unremarkable. Tongue normal. Mucous membranes moist.  RESPIRATORY: Clear to auscultation without rales, wheezes or rhonchi. CARDIOVASCULAR: Regular rate and rhythm without murmur, rub or gallop. BREASTS:  Right breast with post operative and post radiation changes.  Inferior edema (chronic). Moderate fibrocystic changes .  No discrete masses, skin changes or nipple discharge.  Left breast with fibrocystic changes in the upper outer quadrant.  Inferior fibrocystic changes.  No discrete masses, skin changes or nipple discharge. ABDOMEN: Soft, non-tender, with active bowel sounds, and no appreciable hepatosplenomegaly. No masses. SKIN: No rashes, ulcers or lesions. EXTREMITIES: Chronic lower extremity changes.  No skin discoloration or tenderness. No palpable cords. LYMPH NODES: No  palpable cervical, supraclavicular, axillary or inguinal adenopathy  NEUROLOGICAL: Unremarkable. PSYCH: Appropriate.   Appointment on 05/14/2017  Component Date Value Ref Range Status  . Sodium 05/14/2017 140  135 - 145 mmol/L Final  . Potassium 05/14/2017 4.1  3.5 - 5.1 mmol/L Final  . Chloride 05/14/2017 109  101 - 111 mmol/L Final  . CO2 05/14/2017 21* 22 - 32 mmol/L Final  . Glucose, Bld 05/14/2017 101* 65 - 99 mg/dL Final  . BUN 05/14/2017 34* 6 - 20 mg/dL Final  . Creatinine, Ser 05/14/2017 1.39* 0.44 - 1.00 mg/dL Final  . Calcium 05/14/2017 9.9  8.9 - 10.3 mg/dL Final  . Total Protein 05/14/2017 7.8  6.5 - 8.1 g/dL Final  . Albumin 05/14/2017 4.2  3.5 - 5.0 g/dL Final  . AST 05/14/2017 21  15 - 41 U/L Final  . ALT 05/14/2017 13* 14 - 54 U/L Final  . Alkaline Phosphatase 05/14/2017 148* 38 - 126 U/L Final  . Total Bilirubin 05/14/2017 0.4  0.3 - 1.2 mg/dL Final  . GFR calc non Af Amer 05/14/2017 42* >60 mL/min Final  . GFR calc Af Amer 05/14/2017 49* >60 mL/min Final   Comment: (NOTE) The eGFR has been calculated  using the CKD EPI equation. This calculation has not been validated in all clinical situations. eGFR's persistently <60 mL/min signify possible Chronic Kidney Disease.   Georgiann Hahn gap 05/14/2017 10  5 - 15 Final   Performed at [facility], [address] [place], [place] [address]  . WBC 05/14/2017 11.6* 3.6 - 11.0 K/uL Final  . RBC 05/14/2017 4.02  3.80 - 5.20 MIL/uL Final  . Hemoglobin 05/14/2017 11.7* 12.0 - 16.0 g/dL Final  . HCT 05/14/2017 36.2  35.0 - 47.0 % Final  . MCV 05/14/2017 90.1  80.0 - 100.0 fL Final  . MCH 05/14/2017 29.0  26.0 - 34.0 pg Final  . MCHC 05/14/2017 32.1  32.0 - 36.0 g/dL Final  . RDW 05/14/2017 15.5* 11.5 - 14.5 % Final  . Platelets 05/14/2017 231  150 - 440 K/uL Final  . Neutrophils Relative % 05/14/2017 81  % Final  . Neutro Abs 05/14/2017 9.5* 1.4 - 6.5 K/uL Final  . Lymphocytes Relative 05/14/2017 10  % Final  . Lymphs Abs 05/14/2017 1.1  1.0 - 3.6 K/uL Final  . Monocytes Relative 05/14/2017 6  % Final  . Monocytes Absolute 05/14/2017 0.7  0.2 - 0.9 K/uL Final  . Eosinophils Relative 05/14/2017 2  % Final  . Eosinophils Absolute 05/14/2017 0.2  0 - 0.7 K/uL Final  . Basophils Relative 05/14/2017 1  % Final  . Basophils Absolute 05/14/2017 0.1  0 - 0.1 K/uL Final   Performed at [facility], [address] [place], [place] [address]    Assessment:  LANGLEY FLATLEY is a 52 y.o. female with a history of stage I right breast cancer status post wide local incision on 08/12/2012.  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  Sentinel lymph node was negative.  Tumor was ER/PR positive and Her2/neu negative.  Oncotype DX score was 14 (low risk).  She completed breast radiation in 10/2012.  She began tamoxifen in 10/2012, but discontinued it on 10/19/2014 following a submassive PE.    Bilateral diagnostic mammogram on 10/10/2016 revealed no mammographic evidence for malignancy.  CA27.29 has been followed: 21.7 on  04/24/2013, 21.4 on 07/31/2013, 17.9 on 03/09/2014, 20.7 on 01/18/2015, 17.2 on 06/10/2015, 22.5 on 08/13/2015, 16.6 on 11/08/2015,  19.8 on 01/31/2016, 25.2 on 05/29/2016, and 19.3 on 11/30/2016.  She had a bilateral submassive pulmonary emboli on 10/18/2014.  Chest CT angiogram revealed submassive PE with right heart strain.  She received catheter directed lytic therapy at [facility].  Lower extremity duplex was negative for DVT.  She was on Xarelto (discontinued secondary to oral bleeding).  She began Eliquis on 07/08/2015.  Hypercoagulable work-up revealed the following normal studies:  Factor V Leiden, prothrombin gene mutation, lupus anticoagulant panel, anticardiolipin antibodies, protein S total (127%), protein S activity (99%), protein C total (71%), protein C activity (77%), and ATIII activity (82%).  Bone density study on 01/26/2015 was normal with a T score of -0.3.  She has had no menses since 07/2012.  Labs on 01/18/2015 confirmed a post-menopausal status.  She began Femara on 06/10/2015.  She was noted to have a normocytic anemia on 06/10/2015.  Labs on 07/08/2015 revealed documented iron deficiency.  Ferritin was 25, iron saturation 6%, TIBC 473 (high).  Normal labs included B12, folate, SPEP revealed, LFTs, and guaiac cards x 3.   She is on oral iron with vitamin C.  Ferritin has been followed:  25 on 07/08/2015, 30 on 11/08/2015, 46 on 01/31/2016, and 37 on 05/29/2016.  She was noted to have stage III renal insufficiency on 06/10/2015.  Creatinine was 1.34 (CrCl 45 ml/min) with prior baseline Cr of 0.86 - 0.93. She was seen by nephrology.  Etiology was felt secondary to dehydration.  Creatinine is 1.19 today.  Symptomatically, she feels good. She complains of joint pains. Exam reveals post radiation changes in her RIGHT breast. CBC is normal. Creatinine is 1.39.  Alkaline phosphatase is elevated at 148.  Plan: 1.  Labs today:  CBC with diff, CMP, ferritin, CA27.29. 2.  Mammogram  due in 09/2017 - will be ordered by Dr Bary Castilla 3.  Continue Femara as previously prescribed. Anticipate completion in 10/2017. 4.  Continue Eliquis as previously prescribed. 5.  Discussed BCI testing to assess the benefit of extended (5 versus 10 years) adjuvant hormonal therapy. Patient provided with written information on the testing today and encouraged to contact their insurance company to inquire about coverage and out of pocket costs.  6.  Discuss iron stores. Ferritin 53. Continue oral iron as previously prescribed  Goal ferritin 100. 7.  RTC for labs in 1 month (LFTs, fractionated alkaline phosphatase)   8.  RTC in 6 months for MD assessment and labs (CBC with diff, CMP, ferritin, CA27.29).   Honor Loh, NP  05/14/2017, 4:22 PM   I saw and evaluated the patient, participating in the key portions of the service and reviewing pertinent diagnostic studies and records.  I reviewed the nurse practitioner's note and agree with the findings and the plan.  Multiple questions were asked by the patient and answered.   Nolon Stalls, MD 05/14/2017,4:22 PM

## 2017-05-14 NOTE — Progress Notes (Signed)
Pt in for 6 month follow up of right breast cancer.

## 2017-05-15 LAB — CANCER ANTIGEN 27.29: CA 27.29: 22.1 U/mL (ref 0.0–38.6)

## 2017-05-16 ENCOUNTER — Telehealth: Payer: Self-pay | Admitting: Hematology and Oncology

## 2017-05-16 NOTE — Telephone Encounter (Signed)
Oral Oncology Patient Advocate Encounter  Called patient and left a message for her to call me so I can see if we can help her with her Eliquis.    [facility] Patient Advocate [phone] 05/16/2017 11:57 AM

## 2017-05-20 DIAGNOSIS — R748 Abnormal levels of other serum enzymes: Secondary | ICD-10-CM | POA: Insufficient documentation

## 2017-05-29 ENCOUNTER — Other Ambulatory Visit: Payer: Self-pay | Admitting: Acute Care

## 2017-05-29 DIAGNOSIS — R42 Dizziness and giddiness: Secondary | ICD-10-CM

## 2017-06-08 ENCOUNTER — Ambulatory Visit
Admission: RE | Admit: 2017-06-08 | Discharge: 2017-06-08 | Disposition: A | Discharge status: Home / Self Care | Payer: BLUE CROSS/BLUE SHIELD | Source: Ambulatory Visit | Attending: Acute Care | Admitting: Acute Care

## 2017-06-08 DIAGNOSIS — R42 Dizziness and giddiness: Secondary | ICD-10-CM | POA: Diagnosis not present

## 2017-06-13 ENCOUNTER — Inpatient Hospital Stay: Payer: BLUE CROSS/BLUE SHIELD | Attending: Hematology and Oncology

## 2017-06-13 ENCOUNTER — Other Ambulatory Visit: Payer: Self-pay

## 2017-06-13 DIAGNOSIS — C50911 Malignant neoplasm of unspecified site of right female breast: Secondary | ICD-10-CM

## 2017-06-13 DIAGNOSIS — C50211 Malignant neoplasm of upper-inner quadrant of right female breast: Secondary | ICD-10-CM | POA: Insufficient documentation

## 2017-06-13 DIAGNOSIS — R748 Abnormal levels of other serum enzymes: Secondary | ICD-10-CM

## 2017-06-13 DIAGNOSIS — Z170 Estrogen receptor positive status [ER+]: Secondary | ICD-10-CM

## 2017-06-13 LAB — HEPATIC FUNCTION PANEL
ALT: 17 U/L (ref 14–54)
AST: 18 U/L (ref 15–41)
Albumin: 3.6 g/dL (ref 3.5–5.0)
Alkaline Phosphatase: 166 U/L — ABNORMAL HIGH (ref 38–126)
Bilirubin, Direct: <0.1 mg/dL — ABNORMAL LOW (ref 0.1–0.5)
Total Bilirubin: 0.6 mg/dL (ref 0.3–1.2)
Total Protein: 7.3 g/dL (ref 6.5–8.1)

## 2017-06-15 LAB — ALKALINE PHOSPHATASE, ISOENZYMES
Alk Phos Bone Fract: 32 % (ref 14–68)
Alk Phos Liver Fract: 33 % (ref 18–85)
Alk Phos: 176 IU/L — ABNORMAL HIGH (ref 39–117)
Intestinal %: 35 % — ABNORMAL HIGH (ref 0–18)

## 2017-06-27 ENCOUNTER — Other Ambulatory Visit: Payer: Self-pay | Admitting: Hematology and Oncology

## 2017-07-31 ENCOUNTER — Ambulatory Visit: Payer: BLUE CROSS/BLUE SHIELD | Attending: Neurology | Admitting: Physical Therapy

## 2017-07-31 ENCOUNTER — Other Ambulatory Visit: Payer: Self-pay

## 2017-07-31 ENCOUNTER — Encounter: Payer: Self-pay | Admitting: Physical Therapy

## 2017-07-31 DIAGNOSIS — R262 Difficulty in walking, not elsewhere classified: Secondary | ICD-10-CM | POA: Diagnosis not present

## 2017-07-31 DIAGNOSIS — R269 Unspecified abnormalities of gait and mobility: Secondary | ICD-10-CM

## 2017-07-31 DIAGNOSIS — M6281 Muscle weakness (generalized): Secondary | ICD-10-CM | POA: Diagnosis present

## 2017-07-31 NOTE — Therapy (Signed)
[place] MAIN [facility] SERVICES [address] [place], [place], [address] Phone: [phone]   Fax:  [phone]  Physical Therapy Evaluation  Patient Details  Name: Nicole Schmidt MRN: [id number] Date of Birth: 1965/05/03 Referring Provider: Anabel Bene   Encounter Date: 07/31/2017  PT End of Session - 07/31/17 1703    Visit Number  1    Number of Visits  17    Date for PT Re-Evaluation  09/25/17    PT Start Time  0445    PT Stop Time  0545    PT Time Calculation (min)  60 min    Equipment Utilized During Treatment  Gait belt    Activity Tolerance  Patient tolerated treatment well    Behavior During Therapy  [facility] for tasks assessed/performed       Past Medical History:  Diagnosis Date  . Anemia   . Arthritis   . Breast cancer of upper-inner quadrant of right female breast ([place])    Right breast invasive CA and DCIS , T1,N0,M0. Er/PR pos, her 2 negative  . Diabetes mellitus without complication ([place])   . Gout   . Gout   . Hypertension   . Menopause    age 52  . Pulmonary embolism ([place]) 10/2014  . Renal insufficiency   . Thyroid goiter     Past Surgical History:  Procedure Laterality Date  . BREAST BIOPSY Right 2014   breast ca  . BREAST EXCISIONAL BIOPSY Right 2014   breast ca  . BREAST SURGERY Right 2014   with sentinel node bx subareaolar duct excision  . COLONOSCOPY WITH PROPOFOL N/A 01/30/2017   Procedure: COLONOSCOPY WITH PROPOFOL;  Surgeon: Christene Lye, MD;  Location: [facility] ENDOSCOPY;  Service: Endoscopy;  Laterality: N/A;    There were no vitals filed for this visit.   Subjective Assessment - 07/31/17 1659    Subjective  Patient can not walk in open spaces unless she has her RW. She was using her cane and now uses a RW.     Pertinent History  Patient receintly is not working due to not being able to walk without AD. according to MD note--Patient states that she has been experiencing dizziness while in  open spaces for the last 4 years but in last couple of weeks her symptoms have increased. She states that if she is around objects that she can hold on to she is ok, but if she has nothing to hold on to she will be dizzy. Patient states that her symptoms will last a few seconds that make her feel dizzy and off balance and she states that it lasts long enough to make her scared. She has seen by ENT Dr. Tami Ribas, he completed tilt table testing and she states she was told that her test was normal. Dr. Tami Ribas has ordered for patient to start physical therapy next week. Her PCP checked her carotid arteries and came up negative as well per patient. Patient was prescribed buspirone 10 mg every 12 hours. Patient states that both her PCP and Dr. Tami Ribas feel that patient could be having panic attacks. Patient does not feel that she is having panic attacks. She was also prescribed xanax by her PCP yesterday but has not yet picked up medication and does not know if she needs to take this medication. Dr. Ronnald Collum her PCP has also ordered a psychiatry and she is scheduled to see her on May 20th. Patient is noted to be  ambulating with assistance from walker. Patient states that walking with walker makes her feel more secure. She denies any recent falls. States she is fine walking and with dizziness if objects are around her. States that if someone is walking with her and she is able to touch them she will be ok. Dizziness doesn't cause her to fall. Per patient it is very short lasting. States she feels. Dizzy, off balance, no nausea. States she has been told she is having a panic attack. States in the last few weeks it has gotten worse. Patient states no problems while she is getting down. Only when she is open spaces and not around objects. No fluttering feeling or racing heart in chest. Doesn't "take her breath away". No weakness, numbness, tingling, or vision changes. Patient states she is drinking water. No decreased  mobility in neck. No dizziness when changing position.    Limitations  Walking    Patient Stated Goals  She wants to walk without her RW and be able to walk in open spaces.         [facility] PT Assessment - 07/31/17 1707      Assessment   Medical Diagnosis  polyneuropathy    Referring Provider  Gurney Maxin E    Onset Date/Surgical Date  07/26/17    Hand Dominance  Right    Prior Therapy  no      Precautions   Precautions  None      Restrictions   Weight Bearing Restrictions  No      Balance Screen   Has the patient fallen in the past 6 months  No    Has the patient had a decrease in activity level because of a fear of falling?   Yes    Is the patient reluctant to leave their home because of a fear of falling?   No      Home Social worker  Private residence    Living Arrangements  Alone    Available Help at Discharge  Family    Type of [place]  One level    [place] - 2 wheels;[place]asandra [place] - single point      Prior Function   Level of Independence  Independent with household mobility with device;Independent;Independent with basic ADLs    Vocation  Full time employment medical leave    Vocation Requirements  stand and walk        PAIN: no pain   POSTURE: WNL   PROM/AROM: WNL  STRENGTH:  Graded on a 0-5 scale Muscle Group Left Right                          Hip Flex 3+/5 3+/5  Hip Abd 3/5 3/5  Hip Add 2/5 2/5  Hip Ext 2/5 2/5  Hip IR/ER 3+/5 3+/5  Knee Flex 5/5 5/5  Knee Ext 5/5 5/5  Ankle DF 5/5 5/5  Ankle PF 4/5 4/5   Unable to raise up on her toes  SENSATION: WFL BLE   FUNCTIONAL MOBILITY: independent for transfers Needs min assist for supine to sit mobility  Needs RW for ambulation  Unable to single leg stand Unable to tandem stand    BALANCE: Static Standing Balance  Normal Able to maintain standing balance against maximal resistance   Good Able to maintain  standing balance against moderate resistance  Good-/Fair+ Able to maintain standing balance against minimal resistance x  Fair Able to stand unsupported without UE support and without LOB for 1-2 min   Fair- Requires Min A and UE support to maintain standing without loss of balance   Poor+ Requires mod A and UE support to maintain standing without loss of balance   Poor Requires max A and UE support to maintain standing balance without loss      Standing Dynamic Balance  Normal Stand independently unsupported, able to weight shift and cross midline maximally   Good Stand independently unsupported, able to weight shift and cross midline moderately   Good-/Fair+ Stand independently unsupported, able to weight shift across midline minimally   Fair Stand independently unsupported, weight shift, and reach ipsilaterally, loss of balance when crossing midline x  Poor+ Able to stand with Min A and reach ipsilaterally, unable to weight shift   Poor Able to stand with Mod A and minimally reach ipsilaterally, unable to cross midline.     GAIT: Patient ambulates with RW and is independent with decreased gait speed  OUTCOME MEASURES: TEST Outcome Interpretation  5 times sit<>stand 21.51sec >7 yo, >15 sec indicates increased risk for falls  10 meter walk test     .70            m/s <1.0 m/s indicates increased risk for falls; limited community ambulator  Timed up and Go  14.53               sec <14 sec indicates increased risk for falls  6 minute walk test  490              Feet 1000 feet is community ambulator                     Objective measurements completed on examination: See above findings.              PT Education - 07/31/17 1702    Education Details  plan of care    Person(s) Educated  Patient    Methods  Explanation    Comprehension  Verbalized understanding       PT Short Term Goals - 07/31/17 1731      PT SHORT TERM GOAL #1   Title  Patient will  increase BLE gross strength to 4+/5 as to improve functional strength for independent gait, increased standing tolerance and increased ADL ability.    Time  4    Period  Weeks    Status  New    Target Date  08/28/17        PT Long Term Goals - 07/31/17 1713      PT LONG TERM GOAL #1   Title  Patient will be independent in home exercise program to improve strength/mobility for better functional independence with ADLs.    Time  8    Period  Weeks    Status  New    Target Date  09/25/17      PT LONG TERM GOAL #2   Title   Patient (< 8 years old) will complete five times sit to stand test in < 10 seconds indicating an increased LE strength and improved balance.    Time  8    Period  Weeks    Status  New    Target Date  09/25/17      PT LONG TERM GOAL #3   Title  Patient will increase six minute walk test  distance to >1000 for progression to community ambulator and improve gait ability    Time  8    Period  Weeks    Status  New    Target Date  09/25/17      PT LONG TERM GOAL #4   Title  Patient will increase 10 meter walk test to >1.45m/s as to improve gait speed for better community ambulation and to reduce fall risk.    Time  8    Period  Weeks    Status  New    Target Date  09/25/17             Plan - 07/31/17 1741    Clinical Impression Statement  Patient is 52 yr old female with decreased static standing and decreased dynamic standing balance and strength deficits B hips . She ambulates with RW for intermediate  distances 400 feet . She has decreased outcome measures including decreased 5 x sit to stand, decreased TUg, decreased 6 MW test . She will benefit from skilled PT to improve balance, strength and mobility and decrease her risk of falls.     Clinical Presentation  Evolving    Clinical Decision Making  Moderate    Rehab Potential  Good    PT Frequency  2x / week    PT Duration  8 weeks    PT Treatment/Interventions  Patient/family education;Therapeutic  activities;Therapeutic exercise;Functional mobility training;Balance training;Neuromuscular re-education       Patient will benefit from skilled therapeutic intervention in order to improve the following deficits and impairments:  Abnormal gait, Decreased balance, Decreased endurance, Decreased mobility, Difficulty walking, Obesity, Decreased strength  Visit Diagnosis: Difficulty in walking, not elsewhere classified  Muscle weakness (generalized)  Abnormality of gait     Problem List Patient Active Problem List   Diagnosis Date Noted  . Elevated alkaline phosphatase level 05/20/2017  . Goals of care, counseling/discussion 05/14/2017  . Chronic renal disease, stage III ([place]) 07/08/2015  . Iron deficiency anemia 07/08/2015  . Acute renal failure superimposed on stage 3 chronic kidney disease ([place]) 10/22/2014  . History of breast cancer 10/22/2014  . Elevated troponin 10/22/2014  . Pulmonary embolus ([place])   . Sepsis ([place]) 10/18/2014  . Pulmonary emboli ([place]) 10/18/2014  . Pulmonary embolism ([place]) 10/18/2014  . Candidiasis of breast 07/06/2014  . Malignant neoplasm of right female breast ([place]) 07/24/2012    Alanson Puls, PT DPT 07/31/2017, 5:51 PM  [place] MAIN [facility] SERVICES [address] [place], [place], [address] Phone: [phone]   Fax:  [phone]  Name: CHRISANN MELARAGNO MRN: [id number] Date of Birth: 1965-10-06

## 2017-08-02 ENCOUNTER — Encounter: Payer: Self-pay | Admitting: Physical Therapy

## 2017-08-02 ENCOUNTER — Ambulatory Visit: Payer: BLUE CROSS/BLUE SHIELD | Admitting: Physical Therapy

## 2017-08-02 DIAGNOSIS — R262 Difficulty in walking, not elsewhere classified: Secondary | ICD-10-CM

## 2017-08-02 DIAGNOSIS — M6281 Muscle weakness (generalized): Secondary | ICD-10-CM

## 2017-08-02 DIAGNOSIS — R269 Unspecified abnormalities of gait and mobility: Secondary | ICD-10-CM

## 2017-08-02 NOTE — Therapy (Addendum)
[place] MAIN [facility] SERVICES [address] [place], [place], [address] Phone: [phone]   Fax:  [phone]  Physical Therapy Treatment  Patient Details  Name: Nicole Schmidt MRN: [id number] Date of Birth: 1965/02/22 Referring Provider: Anabel Bene   Encounter Date: 08/02/2017  PT End of Session - 08/02/17 1314    Visit Number  2    Number of Visits  17    Date for PT Re-Evaluation  09/25/17    PT Start Time  0100    PT Stop Time  0145    PT Time Calculation (min)  45 min    Equipment Utilized During Treatment  Gait belt    Activity Tolerance  Patient tolerated treatment well    Behavior During Therapy  [facility] for tasks assessed/performed       Past Medical History:  Diagnosis Date  . Anemia   . Arthritis   . Breast cancer of upper-inner quadrant of right female breast ([place])    Right breast invasive CA and DCIS , T1,N0,M0. Er/PR pos, her 2 negative  . Diabetes mellitus without complication ([place])   . Gout   . Gout   . Hypertension   . Menopause    age 18  . Pulmonary embolism ([place]) 10/2014  . Renal insufficiency   . Thyroid goiter     Past Surgical History:  Procedure Laterality Date  . BREAST BIOPSY Right 2014   breast ca  . BREAST EXCISIONAL BIOPSY Right 2014   breast ca  . BREAST SURGERY Right 2014   with sentinel node bx subareaolar duct excision  . COLONOSCOPY WITH PROPOFOL N/A 01/30/2017   Procedure: COLONOSCOPY WITH PROPOFOL;  Surgeon: Christene Lye, MD;  Location: [facility] ENDOSCOPY;  Service: Endoscopy;  Laterality: N/A;    There were no vitals filed for this visit.  Subjective Assessment - 08/02/17 1313    Subjective  Patient is doing well, she is doing her HEP. Reports no pain.    Pertinent History  Patient receintly is not working due to not being able to walk without AD. according to MD note--Patient states that she has been experiencing dizziness while in open spaces for the last 4 years but in  last couple of weeks her symptoms have increased. She states that if she is around objects that she can hold on to she is ok, but if she has nothing to hold on to she will be dizzy. Patient states that her symptoms will last a few seconds that make her feel dizzy and off balance and she states that it lasts long enough to make her scared. She has seen by ENT Dr. Tami Ribas, he completed tilt table testing and she states she was told that her test was normal. Dr. Tami Ribas has ordered for patient to start physical therapy next week. Her PCP checked her carotid arteries and came up negative as well per patient. Patient was prescribed buspirone 10 mg every 12 hours. Patient states that both her PCP and Dr. Tami Ribas feel that patient could be having panic attacks. Patient does not feel that she is having panic attacks. She was also prescribed xanax by her PCP yesterday but has not yet picked up medication and does not know if she needs to take this medication. Dr. Ronnald Collum her PCP has also ordered a psychiatry and she is scheduled to see her on May 20th. Patient is noted to be ambulating with assistance from walker. Patient states that walking with walker makes  her feel more secure. She denies any recent falls. States she is fine walking and with dizziness if objects are around her. States that if someone is walking with her and she is able to touch them she will be ok. Dizziness doesn't cause her to fall. Per patient it is very short lasting. States she feels. Dizzy, off balance, no nausea. States she has been told she is having a panic attack. States in the last few weeks it has gotten worse. Patient states no problems while she is getting down. Only when she is open spaces and not around objects. No fluttering feeling or racing heart in chest. Doesn't "take her breath away". No weakness, numbness, tingling, or vision changes. Patient states she is drinking water. No decreased mobility in neck. No dizziness when changing  position.    Limitations  Walking    Patient Stated Goals  She wants to walk without her RW and be able to walk in open spaces.    Currently in Pain?  No/denies    Multiple Pain Sites  No       Treatment: Nu-step x 5 mins L 5 , over 55 SPM Heel slides 3 lbs x 10 BLE x 2 sets hooklying marching with 3 lbs x 10 x 2; x 2 sets sidelying hip abd with 3 lbs x 10 x 2; x 2 sets sidelying knees flexed x 3 lbs x 10 x 2; x 2 sets sidelying clams x 20 ; x 2 sets  SLR with 3 lbs x 5 x 4 ; x 2 sets  Standing hip abd with OTB x 10 BLE Standing hip ext with OTB x 10 BLE  BOSU lunges x 10 bLE wotj one UE support Toe taps in standing to BOSU ball 1 UE support  Patient needs cues for correct technique and posture.. No reports of pain and needs rest periods due to fatigue.                       PT Education - 08/02/17 1313    Education Details  HEP    Person(s) Educated  Patient    Methods  Explanation;Demonstration;Tactile cues;Verbal cues    Comprehension  Verbalized understanding;Returned demonstration;Verbal cues required;Tactile cues required       PT Short Term Goals - 07/31/17 1731      PT SHORT TERM GOAL #1   Title  Patient will increase BLE gross strength to 4+/5 as to improve functional strength for independent gait, increased standing tolerance and increased ADL ability.    Time  4    Period  Weeks    Status  New    Target Date  08/28/17        PT Long Term Goals - 07/31/17 1713      PT LONG TERM GOAL #1   Title  Patient will be independent in home exercise program to improve strength/mobility for better functional independence with ADLs.    Time  8    Period  Weeks    Status  New    Target Date  09/25/17      PT LONG TERM GOAL #2   Title   Patient (< 28 years old) will complete five times sit to stand test in < 10 seconds indicating an increased LE strength and improved balance.    Time  8    Period  Weeks    Status  New    Target Date  09/25/17  PT LONG TERM GOAL #3   Title  Patient will increase six minute walk test distance to >1000 for progression to community ambulator and improve gait ability    Time  8    Period  Weeks    Status  New    Target Date  09/25/17      PT LONG TERM GOAL #4   Title  Patient will increase 10 meter walk test to >1.27m/s as to improve gait speed for better community ambulation and to reduce fall risk.    Time  8    Period  Weeks    Status  New    Target Date  09/25/17            Plan - 08/02/17 1314    Clinical Impression Statement  Patient instructed in basic strengthening exercises. Patient fatigues quickly requiring short rest breaks in between exercises. Patient instructed in strengthening with increased repetition/resistance. Patient would benefit from additional skilled PT intervention to improve strength and gait to improve overall mobility.     Rehab Potential  Good    PT Frequency  2x / week    PT Duration  8 weeks    PT Treatment/Interventions  Patient/family education;Therapeutic activities;Therapeutic exercise;Functional mobility training;Balance training;Neuromuscular re-education       Patient will benefit from skilled therapeutic intervention in order to improve the following deficits and impairments:  Abnormal gait, Decreased balance, Decreased endurance, Decreased mobility, Difficulty walking, Obesity, Decreased strength  Visit Diagnosis: Difficulty in walking, not elsewhere classified  Muscle weakness (generalized)  Abnormality of gait     Problem List Patient Active Problem List   Diagnosis Date Noted  . Elevated alkaline phosphatase level 05/20/2017  . Goals of care, counseling/discussion 05/14/2017  . Chronic renal disease, stage III ([place]) 07/08/2015  . Iron deficiency anemia 07/08/2015  . Acute renal failure superimposed on stage 3 chronic kidney disease ([place]) 10/22/2014  . History of breast cancer 10/22/2014  . Elevated troponin 10/22/2014  .  Pulmonary embolus ([place])   . Sepsis ([place]) 10/18/2014  . Pulmonary emboli ([place]) 10/18/2014  . Pulmonary embolism ([place]) 10/18/2014  . Candidiasis of breast 07/06/2014  . Malignant neoplasm of right female breast ([place]) 07/24/2012    Alanson Puls, PT DPT 08/02/2017, 1:16 PM  [place] MAIN [facility] SERVICES [address] [place], [place], [address] Phone: [phone]   Fax:  [phone]  Name: Nicole Schmidt MRN: [id number] Date of Birth: 1965-10-07

## 2017-08-07 ENCOUNTER — Ambulatory Visit: Payer: BLUE CROSS/BLUE SHIELD | Admitting: Physical Therapy

## 2017-08-07 ENCOUNTER — Encounter: Payer: Self-pay | Admitting: Physical Therapy

## 2017-08-07 DIAGNOSIS — R262 Difficulty in walking, not elsewhere classified: Secondary | ICD-10-CM | POA: Diagnosis not present

## 2017-08-07 DIAGNOSIS — R269 Unspecified abnormalities of gait and mobility: Secondary | ICD-10-CM

## 2017-08-07 DIAGNOSIS — M6281 Muscle weakness (generalized): Secondary | ICD-10-CM

## 2017-08-07 NOTE — Therapy (Signed)
[place] MAIN [facility] SERVICES [address] [place], [place], [address] Phone: [phone]   Fax:  [phone]  Physical Therapy Treatment  Patient Details  Name: TAELER WINNING MRN: [id number] Date of Birth: 01/28/1966 Referring Provider: Anabel Bene   Encounter Date: 08/07/2017  PT End of Session - 08/07/17 1219    Visit Number  3    Number of Visits  17    Date for PT Re-Evaluation  09/25/17    PT Start Time  9675    PT Stop Time  1230    PT Time Calculation (min)  45 min    Equipment Utilized During Treatment  Gait belt    Activity Tolerance  Patient tolerated treatment well    Behavior During Therapy  [facility] for tasks assessed/performed       Past Medical History:  Diagnosis Date  . Anemia   . Arthritis   . Breast cancer of upper-inner quadrant of right female breast ([place])    Right breast invasive CA and DCIS , T1,N0,M0. Er/PR pos, her 2 negative  . Diabetes mellitus without complication ([place])   . Gout   . Gout   . Hypertension   . Menopause    age 24  . Pulmonary embolism ([place]) 10/2014  . Renal insufficiency   . Thyroid goiter     Past Surgical History:  Procedure Laterality Date  . BREAST BIOPSY Right 2014   breast ca  . BREAST EXCISIONAL BIOPSY Right 2014   breast ca  . BREAST SURGERY Right 2014   with sentinel node bx subareaolar duct excision  . COLONOSCOPY WITH PROPOFOL N/A 01/30/2017   Procedure: COLONOSCOPY WITH PROPOFOL;  Surgeon: Christene Lye, MD;  Location: [facility] ENDOSCOPY;  Service: Endoscopy;  Laterality: N/A;    There were no vitals filed for this visit.  Subjective Assessment - 08/07/17 1218    Subjective  Patient is doing well, she is doing some of her  HEP, she says she does not have the same space at home as she does here.. Reports no pain.    Pertinent History  Patient receintly is not working due to not being able to walk without AD. according to MD note--Patient states that she has  been experiencing dizziness while in open spaces for the last 4 years but in last couple of weeks her symptoms have increased. She states that if she is around objects that she can hold on to she is ok, but if she has nothing to hold on to she will be dizzy. Patient states that her symptoms will last a few seconds that make her feel dizzy and off balance and she states that it lasts long enough to make her scared. She has seen by ENT Dr. Tami Ribas, he completed tilt table testing and she states she was told that her test was normal. Dr. Tami Ribas has ordered for patient to start physical therapy next week. Her PCP checked her carotid arteries and came up negative as well per patient. Patient was prescribed buspirone 10 mg every 12 hours. Patient states that both her PCP and Dr. Tami Ribas feel that patient could be having panic attacks. Patient does not feel that she is having panic attacks. She was also prescribed xanax by her PCP yesterday but has not yet picked up medication and does not know if she needs to take this medication. Dr. Ronnald Collum her PCP has also ordered a psychiatry and she is scheduled to see her on May  20th. Patient is noted to be ambulating with assistance from walker. Patient states that walking with walker makes her feel more secure. She denies any recent falls. States she is fine walking and with dizziness if objects are around her. States that if someone is walking with her and she is able to touch them she will be ok. Dizziness doesn't cause her to fall. Per patient it is very short lasting. States she feels. Dizzy, off balance, no nausea. States she has been told she is having a panic attack. States in the last few weeks it has gotten worse. Patient states no problems while she is getting down. Only when she is open spaces and not around objects. No fluttering feeling or racing heart in chest. Doesn't "take her breath away". No weakness, numbness, tingling, or vision changes. Patient states she is  drinking water. No decreased mobility in neck. No dizziness when changing position.    Limitations  Walking    Patient Stated Goals  She wants to walk without her RW and be able to walk in open spaces.    Currently in Pain?  No/denies    Multiple Pain Sites  No       Treatment: Nu-step x 5 mins L 5 , over 55 SPM Heel slides 3 lbs x 10 BLE x 2 sets hooklying marching with 3 lbs x 10 x 2; x 2 sets sidelying hip abd with 3 lbs x 10 x 2; x 2 sets sidelying knees flexed x 3 lbs x 10 x 2; x 2 sets sidelying clams x 20 ; x 2 sets  SLR with 3 lbs x 5 x 4 ; x 2 sets  Standing hip abd with OTB x 10 BLE Standing hip ext with OTB x 10 BLE  BOSU lunges x 10 bLE wotj one UE support Toe taps in standing to BOSU ball 1 UE support  Patient needs cues for correct technique and posture.. No reports of pain and needs rest periods due to fatigue.                         PT Education - 08/07/17 1219    Education Details  HEP    Person(s) Educated  Patient    Methods  Explanation    Comprehension  Verbalized understanding       PT Short Term Goals - 07/31/17 1731      PT SHORT TERM GOAL #1   Title  Patient will increase BLE gross strength to 4+/5 as to improve functional strength for independent gait, increased standing tolerance and increased ADL ability.    Time  4    Period  Weeks    Status  New    Target Date  08/28/17        PT Long Term Goals - 07/31/17 1713      PT LONG TERM GOAL #1   Title  Patient will be independent in home exercise program to improve strength/mobility for better functional independence with ADLs.    Time  8    Period  Weeks    Status  New    Target Date  09/25/17      PT LONG TERM GOAL #2   Title   Patient (< 70 years old) will complete five times sit to stand test in < 10 seconds indicating an increased LE strength and improved balance.    Time  8    Period  Weeks  Status  New    Target Date  09/25/17      PT LONG TERM GOAL #3    Title  Patient will increase six minute walk test distance to >1000 for progression to community ambulator and improve gait ability    Time  8    Period  Weeks    Status  New    Target Date  09/25/17      PT LONG TERM GOAL #4   Title  Patient will increase 10 meter walk test to >1.64m/s as to improve gait speed for better community ambulation and to reduce fall risk.    Time  8    Period  Weeks    Status  New    Target Date  09/25/17            Plan - 08/07/17 1221    Clinical Impression Statement  Patient instructed in basic strengthening exercises. Patient fatigues quickly requiring short rest breaks in between exercises. Patient instructed in strengthening with increased repetition/resistance. Patient would benefit from additional skilled PT intervention to improve strength and gait to improve overall mobility    Rehab Potential  Good    PT Frequency  2x / week    PT Duration  8 weeks    PT Treatment/Interventions  Patient/family education;Therapeutic activities;Therapeutic exercise;Functional mobility training;Balance training;Neuromuscular re-education    PT Next Visit Plan  strengthening closed chain    Consulted and Agree with Plan of Care  Patient       Patient will benefit from skilled therapeutic intervention in order to improve the following deficits and impairments:  Abnormal gait, Decreased balance, Decreased endurance, Decreased mobility, Difficulty walking, Obesity, Decreased strength  Visit Diagnosis: Difficulty in walking, not elsewhere classified  Muscle weakness (generalized)  Abnormality of gait     Problem List Patient Active Problem List   Diagnosis Date Noted  . Elevated alkaline phosphatase level 05/20/2017  . Goals of care, counseling/discussion 05/14/2017  . Chronic renal disease, stage III ([place]) 07/08/2015  . Iron deficiency anemia 07/08/2015  . Acute renal failure superimposed on stage 3 chronic kidney disease ([place]) 10/22/2014  . History  of breast cancer 10/22/2014  . Elevated troponin 10/22/2014  . Pulmonary embolus ([place])   . Sepsis ([place]) 10/18/2014  . Pulmonary emboli ([place]) 10/18/2014  . Pulmonary embolism ([place]) 10/18/2014  . Candidiasis of breast 07/06/2014  . Malignant neoplasm of right female breast ([place]) 07/24/2012    Alanson Puls  PT DPT 08/07/2017, 12:23 PM  [place] MAIN [facility] SERVICES [address] [place], [place], [address] Phone: [phone]   Fax:  [phone]  Name: JAKYRAH HOLLADAY MRN: [id number] Date of Birth: 1965/05/03

## 2017-08-09 ENCOUNTER — Encounter: Payer: Self-pay | Admitting: Physical Therapy

## 2017-08-09 ENCOUNTER — Ambulatory Visit: Payer: BLUE CROSS/BLUE SHIELD | Admitting: Physical Therapy

## 2017-08-09 DIAGNOSIS — R269 Unspecified abnormalities of gait and mobility: Secondary | ICD-10-CM

## 2017-08-09 DIAGNOSIS — R262 Difficulty in walking, not elsewhere classified: Secondary | ICD-10-CM

## 2017-08-09 DIAGNOSIS — M6281 Muscle weakness (generalized): Secondary | ICD-10-CM

## 2017-08-09 NOTE — Therapy (Signed)
[place] MAIN [facility] SERVICES [address] [place], [place], [address] Phone: [phone]   Fax:  [phone]  Physical Therapy Treatment  Patient Details  Name: Nicole Schmidt MRN: [id number] Date of Birth: [birth year] Referring Provider: Anabel Bene   Encounter Date: 08/09/2017  PT End of Session - 08/09/17 1545    Visit Number  4    Number of Visits  17    Date for PT Re-Evaluation  09/25/17    PT Start Time  0315    PT Stop Time  0400    PT Time Calculation (min)  45 min    Equipment Utilized During Treatment  Gait belt    Activity Tolerance  Patient tolerated treatment well    Behavior During Therapy  [facility] for tasks assessed/performed       Past Medical History:  Diagnosis Date  . Anemia   . Arthritis   . Breast cancer of upper-inner quadrant of right female breast ([place])    Right breast invasive CA and DCIS , T1,N0,M0. Er/PR pos, her 2 negative  . Diabetes mellitus without complication ([place])   . Gout   . Gout   . Hypertension   . Menopause    age 52  . Pulmonary embolism ([place]) 10/2014  . Renal insufficiency   . Thyroid goiter     Past Surgical History:  Procedure Laterality Date  . BREAST BIOPSY Right 2014   breast ca  . BREAST EXCISIONAL BIOPSY Right 2014   breast ca  . BREAST SURGERY Right 2014   with sentinel node bx subareaolar duct excision  . COLONOSCOPY WITH PROPOFOL N/A 01/30/2017   Procedure: COLONOSCOPY WITH PROPOFOL;  Surgeon: Christene Lye, MD;  Location: [facility] ENDOSCOPY;  Service: Endoscopy;  Laterality: N/A;    There were no vitals filed for this visit.  Subjective Assessment - 08/09/17 1545    Subjective  Patient is doing well, she is doing some of her  HEP, she says she does not have the same space at home as she does here.. Reports no pain.    Pertinent History  Patient receintly is not working due to not being able to walk without AD. according to MD note--Patient states that she has  been experiencing dizziness while in open spaces for the last 4 years but in last couple of weeks her symptoms have increased. She states that if she is around objects that she can hold on to she is ok, but if she has nothing to hold on to she will be dizzy. Patient states that her symptoms will last a few seconds that make her feel dizzy and off balance and she states that it lasts long enough to make her scared. She has seen by ENT Dr. Tami Ribas, he completed tilt table testing and she states she was told that her test was normal. Dr. Tami Ribas has ordered for patient to start physical therapy next week. Her PCP checked her carotid arteries and came up negative as well per patient. Patient was prescribed buspirone 10 mg every 12 hours. Patient states that both her PCP and Dr. Tami Ribas feel that patient could be having panic attacks. Patient does not feel that she is having panic attacks. She was also prescribed xanax by her PCP yesterday but has not yet picked up medication and does not know if she needs to take this medication. Dr. Ronnald Collum her PCP has also ordered a psychiatry and she is scheduled to see her on May  20th. Patient is noted to be ambulating with assistance from walker. Patient states that walking with walker makes her feel more secure. She denies any recent falls. States she is fine walking and with dizziness if objects are around her. States that if someone is walking with her and she is able to touch them she will be ok. Dizziness doesn't cause her to fall. Per patient it is very short lasting. States she feels. Dizzy, off balance, no nausea. States she has been told she is having a panic attack. States in the last few weeks it has gotten worse. Patient states no problems while she is getting down. Only when she is open spaces and not around objects. No fluttering feeling or racing heart in chest. Doesn't "take her breath away". No weakness, numbness, tingling, or vision changes. Patient states she is  drinking water. No decreased mobility in neck. No dizziness when changing position.    Limitations  Walking    Patient Stated Goals  She wants to walk without her RW and be able to walk in open spaces.    Currently in Pain?  No/denies    Multiple Pain Sites  No       Treatment: Nu-step x 5 mins L 5 , over 55 SPM  Heel slides 3 lbs x 10 BLE x 2 sets  sidelying hip abd with 3 lbs x 10 x 2; x 2 sets  SLR with 3 lbs x 5 x 4 ; x 2 sets  Standing hip abd with OTB x 10 BLE  Standing hip ext with OTB x 10 BLE   BOSU lunges x 10 bLE wotj one UE support  Toe taps in standing to BOSU ball 1 UE support  Gait training with loftstrand 300 feet x 3 with SBA and wall nearby  Patient needs cues for correct technique and posture.. No reports of pain and needs rest periods due to fatigue.                        PT Education - 08/09/17 1545    Education Details  HEP    Person(s) Educated  Patient    Methods  Explanation;Demonstration    Comprehension  Verbalized understanding;Returned demonstration;Verbal cues required       PT Short Term Goals - 07/31/17 1731      PT SHORT TERM GOAL #1   Title  Patient will increase BLE gross strength to 4+/5 as to improve functional strength for independent gait, increased standing tolerance and increased ADL ability.    Time  4    Period  Weeks    Status  New    Target Date  08/28/17        PT Long Term Goals - 07/31/17 1713      PT LONG TERM GOAL #1   Title  Patient will be independent in home exercise program to improve strength/mobility for better functional independence with ADLs.    Time  8    Period  Weeks    Status  New    Target Date  09/25/17      PT LONG TERM GOAL #2   Title   Patient (< 56 years old) will complete five times sit to stand test in < 10 seconds indicating an increased LE strength and improved balance.    Time  8    Period  Weeks    Status  New    Target Date  09/25/17  PT LONG TERM  GOAL #3   Title  Patient will increase six minute walk test distance to >1000 for progression to community ambulator and improve gait ability    Time  8    Period  Weeks    Status  New    Target Date  09/25/17      PT LONG TERM GOAL #4   Title  Patient will increase 10 meter walk test to >1.10m/s as to improve gait speed for better community ambulation and to reduce fall risk.    Time  8    Period  Weeks    Status  New    Target Date  09/25/17            Plan - 08/09/17 1546    Clinical Impression Statement  Patient performed therapeutic exercise to increase LE strength and performed gait training with loftstrand crutch for 300 feet x 3 with wall nearby and SBA with some anxiety during open areas. Patient will continue to benefit from skilled PT to improve strength and mobility.     Rehab Potential  Good    PT Frequency  2x / week    PT Duration  8 weeks    PT Treatment/Interventions  Patient/family education;Therapeutic activities;Therapeutic exercise;Functional mobility training;Balance training;Neuromuscular re-education    PT Next Visit Plan  strengthening closed chain    Consulted and Agree with Plan of Care  Patient       Patient will benefit from skilled therapeutic intervention in order to improve the following deficits and impairments:  Abnormal gait, Decreased balance, Decreased endurance, Decreased mobility, Difficulty walking, Obesity, Decreased strength  Visit Diagnosis: Difficulty in walking, not elsewhere classified  Muscle weakness (generalized)  Abnormality of gait     Problem List Patient Active Problem List   Diagnosis Date Noted  . Elevated alkaline phosphatase level 05/20/2017  . Goals of care, counseling/discussion 05/14/2017  . Chronic renal disease, stage III ([place]) 07/08/2015  . Iron deficiency anemia 07/08/2015  . Acute renal failure superimposed on stage 3 chronic kidney disease ([place]) 10/22/2014  . History of breast cancer 10/22/2014  .  Elevated troponin 10/22/2014  . Pulmonary embolus ([place])   . Sepsis ([place]) 10/18/2014  . Pulmonary emboli ([place]) 10/18/2014  . Pulmonary embolism ([place]) 10/18/2014  . Candidiasis of breast 07/06/2014  . Malignant neoplasm of right female breast ([place]) 07/24/2012    Alanson Puls, PT DPT 08/09/2017, 5:28 PM  [place] MAIN [facility] SERVICES [address] [place], [place], [address] Phone: [phone]   Fax:  [phone]  Name: ROSSI SILVESTRO MRN: [id number] Date of Birth: 09/20/1965

## 2017-08-15 ENCOUNTER — Encounter: Payer: Self-pay | Admitting: Physical Therapy

## 2017-08-15 ENCOUNTER — Ambulatory Visit: Payer: BLUE CROSS/BLUE SHIELD | Attending: Neurology | Admitting: Physical Therapy

## 2017-08-15 DIAGNOSIS — R262 Difficulty in walking, not elsewhere classified: Secondary | ICD-10-CM

## 2017-08-15 DIAGNOSIS — R269 Unspecified abnormalities of gait and mobility: Secondary | ICD-10-CM | POA: Insufficient documentation

## 2017-08-15 DIAGNOSIS — M6281 Muscle weakness (generalized): Secondary | ICD-10-CM | POA: Insufficient documentation

## 2017-08-15 NOTE — Therapy (Signed)
[place] MAIN [facility] SERVICES [address] [name], [place], [address] Phone: [phone]   Fax:  [phone]  Physical Therapy Treatment  Patient Details  Name: Nicole Schmidt MRN: [id number] Date of Birth: 04/28/1965 Referring Provider: Anabel Bene   Encounter Date: 08/15/2017  PT End of Session - 08/15/17 1416    Visit Number  5    Number of Visits  17    Date for PT Re-Evaluation  09/25/17    PT Start Time  0150    PT Stop Time  0230    PT Time Calculation (min)  40 min    Equipment Utilized During Treatment  Gait belt    Activity Tolerance  Patient tolerated treatment well    Behavior During Therapy  [facility] for tasks assessed/performed       Past Medical History:  Diagnosis Date  . Anemia   . Arthritis   . Breast cancer of upper-inner quadrant of right female breast ([place])    Right breast invasive CA and DCIS , T1,N0,M0. Er/PR pos, her 2 negative  . Diabetes mellitus without complication ([place])   . Gout   . Gout   . Hypertension   . Menopause    age 52  . Pulmonary embolism ([place]) 10/2014  . Renal insufficiency   . Thyroid goiter     Past Surgical History:  Procedure Laterality Date  . BREAST BIOPSY Right 2014   breast ca  . BREAST EXCISIONAL BIOPSY Right 2014   breast ca  . BREAST SURGERY Right 2014   with sentinel node bx subareaolar duct excision  . COLONOSCOPY WITH PROPOFOL N/A 01/30/2017   Procedure: COLONOSCOPY WITH PROPOFOL;  Surgeon: Christene Lye, MD;  Location: [facility] ENDOSCOPY;  Service: Endoscopy;  Laterality: N/A;    There were no vitals filed for this visit.  Subjective Assessment - 08/15/17 1416    Subjective  Patient is doing well, she is doing some of her  HEP, she says she does not have the same space at home as she does here.. Reports no pain.    Pertinent History  Patient receintly is not working due to not being able to walk without AD. according to MD note--Patient states that she has  been experiencing dizziness while in open spaces for the last 4 years but in last couple of weeks her symptoms have increased. She states that if she is around objects that she can hold on to she is ok, but if she has nothing to hold on to she will be dizzy. Patient states that her symptoms will last a few seconds that make her feel dizzy and off balance and she states that it lasts long enough to make her scared. She has seen by ENT Dr. Tami Ribas, he completed tilt table testing and she states she was told that her test was normal. Dr. Tami Ribas has ordered for patient to start physical therapy next week. Her PCP checked her carotid arteries and came up negative as well per patient. Patient was prescribed buspirone 10 mg every 12 hours. Patient states that both her PCP and Dr. Tami Ribas feel that patient could be having panic attacks. Patient does not feel that she is having panic attacks. She was also prescribed xanax by her PCP yesterday but has not yet picked up medication and does not know if she needs to take this medication. Dr. Ronnald Collum her PCP has also ordered a psychiatry and she is scheduled to see her on May  20th. Patient is noted to be ambulating with assistance from walker. Patient states that walking with walker makes her feel more secure. She denies any recent falls. States she is fine walking and with dizziness if objects are around her. States that if someone is walking with her and she is able to touch them she will be ok. Dizziness doesn't cause her to fall. Per patient it is very short lasting. States she feels. Dizzy, off balance, no nausea. States she has been told she is having a panic attack. States in the last few weeks it has gotten worse. Patient states no problems while she is getting down. Only when she is open spaces and not around objects. No fluttering feeling or racing heart in chest. Doesn't "take her breath away". No weakness, numbness, tingling, or vision changes. Patient states she is  drinking water. No decreased mobility in neck. No dizziness when changing position.    Limitations  Walking    Patient Stated Goals  She wants to walk without her RW and be able to walk in open spaces.    Currently in Pain?  No/denies    Multiple Pain Sites  No      Treatment:   Gait training with loftstrand crutch in hall way trying to get towards the middle so that she can walk without walls nearby 200 feet x 3   Therapeutic exercise:  Step ups to 6 inch stool x 20 x 2  Standing hip abd, ext x 20 x 2 with OTB BLE  Heel raises x 20 x 2  Squats x 20 x 2   hooklying marching with 3 lbs x 20 x 2  SAQ with 3 lbs x 20 x 2 with 3 sec hold  Heel slide x 20 with 3 lbs x 20 x 2   Hip abd sidelying x 20 x 2 BLE    PT provided min verbal instruction to improve posture and proper use of LE, and improved posture and gait mechanics. Patient responded moderately to instruction                      PT Education - 08/15/17 1416    Education Details  Patient instructed in loftstrand crutch in open areas    Person(s) Educated  Patient    Methods  Explanation;Demonstration;Verbal cues    Comprehension  Verbalized understanding;Returned demonstration;Need further instruction       PT Short Term Goals - 07/31/17 1731      PT SHORT TERM GOAL #1   Title  Patient will increase BLE gross strength to 4+/5 as to improve functional strength for independent gait, increased standing tolerance and increased ADL ability.    Time  4    Period  Weeks    Status  New    Target Date  08/28/17        PT Long Term Goals - 07/31/17 1713      PT LONG TERM GOAL #1   Title  Patient will be independent in home exercise program to improve strength/mobility for better functional independence with ADLs.    Time  8    Period  Weeks    Status  New    Target Date  09/25/17      PT LONG TERM GOAL #2   Title   Patient (< 92 years old) will complete five times sit to stand test in < 10  seconds indicating an increased LE strength and improved balance.  Time  8    Period  Weeks    Status  New    Target Date  09/25/17      PT LONG TERM GOAL #3   Title  Patient will increase six minute walk test distance to >1000 for progression to community ambulator and improve gait ability    Time  8    Period  Weeks    Status  New    Target Date  09/25/17      PT LONG TERM GOAL #4   Title  Patient will increase 10 meter walk test to >1.26m/s as to improve gait speed for better community ambulation and to reduce fall risk.    Time  8    Period  Weeks    Status  New    Target Date  09/25/17            Plan - 08/15/17 1417    Clinical Impression Statement  Pt is making improvements in BLE strength and balance as evidenced by improvements in gait.  Patient is ambulating in the hall with loftstrand crutch  and is able to stand for 40 min during treatment,. Patient demonstrates fear with ambulation.  Patient will benefit from continued skilled PT interventions for improved strength, balance, and QOL    Rehab Potential  Good    PT Frequency  2x / week    PT Duration  8 weeks    PT Treatment/Interventions  Patient/family education;Therapeutic activities;Therapeutic exercise;Functional mobility training;Balance training;Neuromuscular re-education    PT Next Visit Plan  strengthening closed chain    Consulted and Agree with Plan of Care  Patient       Patient will benefit from skilled therapeutic intervention in order to improve the following deficits and impairments:  Abnormal gait, Decreased balance, Decreased endurance, Decreased mobility, Difficulty walking, Obesity, Decreased strength  Visit Diagnosis: Difficulty in walking, not elsewhere classified  Muscle weakness (generalized)  Abnormality of gait     Problem List Patient Active Problem List   Diagnosis Date Noted  . Elevated alkaline phosphatase level 05/20/2017  . Goals of care, counseling/discussion  05/14/2017  . Chronic renal disease, stage III ([place]) 07/08/2015  . Iron deficiency anemia 07/08/2015  . Acute renal failure superimposed on stage 3 chronic kidney disease ([place]) 10/22/2014  . History of breast cancer 10/22/2014  . Elevated troponin 10/22/2014  . Pulmonary embolus ([place])   . Sepsis ([place]) 10/18/2014  . Pulmonary emboli ([place]) 10/18/2014  . Pulmonary embolism ([place]) 10/18/2014  . Candidiasis of breast 07/06/2014  . Malignant neoplasm of right female breast ([place]) 07/24/2012    Alanson Puls, PT DPT 08/15/2017, 2:20 PM  [place] MAIN [facility] SERVICES [address] [place], [place], [address] Phone: [phone]   Fax:  [phone]  Name: Nicole Schmidt MRN: [id number] Date of Birth: August 18, 1965

## 2017-08-21 ENCOUNTER — Encounter: Payer: Self-pay | Admitting: Physical Therapy

## 2017-08-21 ENCOUNTER — Ambulatory Visit: Payer: BLUE CROSS/BLUE SHIELD | Admitting: Physical Therapy

## 2017-08-21 DIAGNOSIS — R269 Unspecified abnormalities of gait and mobility: Secondary | ICD-10-CM

## 2017-08-21 DIAGNOSIS — R262 Difficulty in walking, not elsewhere classified: Secondary | ICD-10-CM | POA: Diagnosis not present

## 2017-08-21 DIAGNOSIS — M6281 Muscle weakness (generalized): Secondary | ICD-10-CM

## 2017-08-21 NOTE — Therapy (Signed)
[place] MAIN [facility] SERVICES [address] [place], [place], [address] Phone: [phone]   Fax:  [phone]  Physical Therapy Treatment  Patient Details  Name: Nicole Schmidt MRN: [id number] Date of Birth: Jan 05, 1966 Referring Provider: Anabel Bene   Encounter Date: 08/21/2017  PT End of Session - 08/21/17 1126    Visit Number  6    Number of Visits  17    Date for PT Re-Evaluation  09/25/17    PT Start Time  1110    PT Stop Time  1155    PT Time Calculation (min)  45 min    Equipment Utilized During Treatment  Gait belt    Activity Tolerance  Patient tolerated treatment well    Behavior During Therapy  [facility] for tasks assessed/performed       Past Medical History:  Diagnosis Date  . Anemia   . Arthritis   . Breast cancer of upper-inner quadrant of right female breast ([place])    Right breast invasive CA and DCIS , T1,N0,M0. Er/PR pos, her 2 negative  . Diabetes mellitus without complication ([place])   . Gout   . Gout   . Hypertension   . Menopause    age 52  . Pulmonary embolism ([place]) 10/2014  . Renal insufficiency   . Thyroid goiter     Past Surgical History:  Procedure Laterality Date  . BREAST BIOPSY Right 2014   breast ca  . BREAST EXCISIONAL BIOPSY Right 2014   breast ca  . BREAST SURGERY Right 2014   with sentinel node bx subareaolar duct excision  . COLONOSCOPY WITH PROPOFOL N/A 01/30/2017   Procedure: COLONOSCOPY WITH PROPOFOL;  Surgeon: Christene Lye, MD;  Location: [facility] ENDOSCOPY;  Service: Endoscopy;  Laterality: N/A;    There were no vitals filed for this visit.  Subjective Assessment - 08/21/17 1125    Subjective  Patient is doing well, she is doing some of her  HEP, she says she does not have the same space at home as she does here.. Reports no pain.    Pertinent History  Patient receintly is not working due to not being able to walk without AD. according to MD note--Patient states that she has  been experiencing dizziness while in open spaces for the last 4 years but in last couple of weeks her symptoms have increased. She states that if she is around objects that she can hold on to she is ok, but if she has nothing to hold on to she will be dizzy. Patient states that her symptoms will last a few seconds that make her feel dizzy and off balance and she states that it lasts long enough to make her scared. She has seen by ENT Dr. Tami Ribas, he completed tilt table testing and she states she was told that her test was normal. Dr. Tami Ribas has ordered for patient to start physical therapy next week. Her PCP checked her carotid arteries and came up negative as well per patient. Patient was prescribed buspirone 10 mg every 12 hours. Patient states that both her PCP and Dr. Tami Ribas feel that patient could be having panic attacks. Patient does not feel that she is having panic attacks. She was also prescribed xanax by her PCP yesterday but has not yet picked up medication and does not know if she needs to take this medication. Dr. Ronnald Collum her PCP has also ordered a psychiatry and she is scheduled to see her on May  20th. Patient is noted to be ambulating with assistance from walker. Patient states that walking with walker makes her feel more secure. She denies any recent falls. States she is fine walking and with dizziness if objects are around her. States that if someone is walking with her and she is able to touch them she will be ok. Dizziness doesn't cause her to fall. Per patient it is very short lasting. States she feels. Dizzy, off balance, no nausea. States she has been told she is having a panic attack. States in the last few weeks it has gotten worse. Patient states no problems while she is getting down. Only when she is open spaces and not around objects. No fluttering feeling or racing heart in chest. Doesn't "take her breath away". No weakness, numbness, tingling, or vision changes. Patient states she is  drinking water. No decreased mobility in neck. No dizziness when changing position.    Limitations  Walking    Patient Stated Goals  She wants to walk without her RW and be able to walk in open spaces.    Currently in Pain?  No/denies    Multiple Pain Sites  No         Treatment:   Gait training with loftstrand crutch in hall way trying to get towards the middle so that she can walk without walls nearby 200 feet x 2 with SBA   Therapeutic exercise:  Step ups to 6 inch stool x 20 x 2  Standing hip abd, ext x 20 x 2 with OTB BLE  Heel raises x 20 x 2  Squats x 20 x 2   hooklying marching with 5 lbs x 20 x 2  SAQ with 5 lbs x 20 x 2 with 3 sec hold  Heel slide x 20 with 5 lbs x 20 x 2   Hip abd sidelying x 20 x 2 BLE    PT provided min verbal instruction to improve posture and proper use of LE, and improved posture and gait mechanics. Patient responded moderately to instruction                       PT Education - 08/21/17 1125    Education Details  HEP    Person(s) Educated  Patient    Methods  Explanation    Comprehension  Verbalized understanding;Returned demonstration;Verbal cues required       PT Short Term Goals - 07/31/17 1731      PT SHORT TERM GOAL #1   Title  Patient will increase BLE gross strength to 4+/5 as to improve functional strength for independent gait, increased standing tolerance and increased ADL ability.    Time  4    Period  Weeks    Status  New    Target Date  08/28/17        PT Long Term Goals - 07/31/17 1713      PT LONG TERM GOAL #1   Title  Patient will be independent in home exercise program to improve strength/mobility for better functional independence with ADLs.    Time  8    Period  Weeks    Status  New    Target Date  09/25/17      PT LONG TERM GOAL #2   Title   Patient (< 60 years old) will complete five times sit to stand test in < 10 seconds indicating an increased LE strength and  improved balance.    Time  8    Period  Weeks    Status  New    Target Date  09/25/17      PT LONG TERM GOAL #3   Title  Patient will increase six minute walk test distance to >1000 for progression to community ambulator and improve gait ability    Time  8    Period  Weeks    Status  New    Target Date  09/25/17      PT LONG TERM GOAL #4   Title  Patient will increase 10 meter walk test to >1.26m/s as to improve gait speed for better community ambulation and to reduce fall risk.    Time  8    Period  Weeks    Status  New    Target Date  09/25/17            Plan - 08/21/17 1127    Clinical Impression Statement  Patient   instructed in gait training on level surfaces without Ad with rest periods standing. Patient is able to perform intermediate LE exercises in supine and standing open and closed chain, with no pain behaviors and resting throughout session. Patient performs dynamic standing balance training with uneven surfaces with CGA. Patient will continue to benefit from skilled PT to improve strength, balance and gait. Patient continues to have LE weakness   and decreased static and dynamic standing balance.    Rehab Potential  Good    PT Frequency  2x / week    PT Duration  8 weeks    PT Treatment/Interventions  Patient/family education;Therapeutic activities;Therapeutic exercise;Functional mobility training;Balance training;Neuromuscular re-education    PT Next Visit Plan  strengthening closed chain    Consulted and Agree with Plan of Care  Patient       Patient will benefit from skilled therapeutic intervention in order to improve the following deficits and impairments:  Abnormal gait, Decreased balance, Decreased endurance, Decreased mobility, Difficulty walking, Obesity, Decreased strength  Visit Diagnosis: Difficulty in walking, not elsewhere classified  Muscle weakness (generalized)  Abnormality of gait     Problem List Patient Active Problem List    Diagnosis Date Noted  . Elevated alkaline phosphatase level 05/20/2017  . Goals of care, counseling/discussion 05/14/2017  . Chronic renal disease, stage III ([place]) 07/08/2015  . Iron deficiency anemia 07/08/2015  . Acute renal failure superimposed on stage 3 chronic kidney disease ([place]) 10/22/2014  . History of breast cancer 10/22/2014  . Elevated troponin 10/22/2014  . Pulmonary embolus ([place])   . Sepsis ([place]) 10/18/2014  . Pulmonary emboli ([place]) 10/18/2014  . Pulmonary embolism ([place]) 10/18/2014  . Candidiasis of breast 07/06/2014  . Malignant neoplasm of right female breast ([place]) 07/24/2012    Alanson Puls, PT DPT 08/21/2017, 11:29 AM  [place] MAIN [facility] SERVICES [address] [place], [place], [address] Phone: [phone]   Fax:  [phone]  Name: SHACOLA SCHUSSLER MRN: [id number] Date of Birth: 04-26-1965

## 2017-08-23 ENCOUNTER — Ambulatory Visit: Payer: BLUE CROSS/BLUE SHIELD | Admitting: Physical Therapy

## 2017-08-23 ENCOUNTER — Encounter: Payer: Self-pay | Admitting: Physical Therapy

## 2017-08-23 DIAGNOSIS — R269 Unspecified abnormalities of gait and mobility: Secondary | ICD-10-CM

## 2017-08-23 DIAGNOSIS — R262 Difficulty in walking, not elsewhere classified: Secondary | ICD-10-CM | POA: Diagnosis not present

## 2017-08-23 DIAGNOSIS — M6281 Muscle weakness (generalized): Secondary | ICD-10-CM

## 2017-08-23 NOTE — Therapy (Addendum)
[place] MAIN [facility] SERVICES [address] [place], [place], [address] Phone: [phone]   Fax:  [phone]  Physical Therapy Treatment  Patient Details  Name: Nicole Schmidt MRN: [id number] Date of Birth: 1965/11/15 Referring Provider: Anabel Bene   Encounter Date: 08/23/2017  PT End of Session - 08/23/17 1309    Visit Number  7    Number of Visits  17    Date for PT Re-Evaluation  09/25/17    PT Start Time  0102    PT Stop Time  0145    PT Time Calculation (min)  43 min    Equipment Utilized During Treatment  Gait belt    Activity Tolerance  Patient tolerated treatment well    Behavior During Therapy  [facility] for tasks assessed/performed       Past Medical History:  Diagnosis Date  . Anemia   . Arthritis   . Breast cancer of upper-inner quadrant of right female breast ([place])    Right breast invasive CA and DCIS , T1,N0,M0. Er/PR pos, her 2 negative  . Diabetes mellitus without complication ([place])   . Gout   . Gout   . Hypertension   . Menopause    age 39  . Pulmonary embolism ([place]) 10/2014  . Renal insufficiency   . Thyroid goiter     Past Surgical History:  Procedure Laterality Date  . BREAST BIOPSY Right 2014   breast ca  . BREAST EXCISIONAL BIOPSY Right 2014   breast ca  . BREAST SURGERY Right 2014   with sentinel node bx subareaolar duct excision  . COLONOSCOPY WITH PROPOFOL N/A 01/30/2017   Procedure: COLONOSCOPY WITH PROPOFOL;  Surgeon: Christene Lye, MD;  Location: [facility] ENDOSCOPY;  Service: Endoscopy;  Laterality: N/A;    There were no vitals filed for this visit.  Subjective Assessment - 08/23/17 1308    Subjective  Patient is doing well, she is doing some of her  HEP, she says she does not have the same space at home as she does here.. Reports no pain.    Pertinent History  Patient receintly is not working due to not being able to walk without AD. according to MD note--Patient states that she has  been experiencing dizziness while in open spaces for the last 4 years but in last couple of weeks her symptoms have increased. She states that if she is around objects that she can hold on to she is ok, but if she has nothing to hold on to she will be dizzy. Patient states that her symptoms will last a few seconds that make her feel dizzy and off balance and she states that it lasts long enough to make her scared. She has seen by ENT Dr. Tami Ribas, he completed tilt table testing and she states she was told that her test was normal. Dr. Tami Ribas has ordered for patient to start physical therapy next week. Her PCP checked her carotid arteries and came up negative as well per patient. Patient was prescribed buspirone 10 mg every 12 hours. Patient states that both her PCP and Dr. Tami Ribas feel that patient could be having panic attacks. Patient does not feel that she is having panic attacks. She was also prescribed xanax by her PCP yesterday but has not yet picked up medication and does not know if she needs to take this medication. Dr. Ronnald Collum her PCP has also ordered a psychiatry and she is scheduled to see her on May  20th. Patient is noted to be ambulating with assistance from walker. Patient states that walking with walker makes her feel more secure. She denies any recent falls. States she is fine walking and with dizziness if objects are around her. States that if someone is walking with her and she is able to touch them she will be ok. Dizziness doesn't cause her to fall. Per patient it is very short lasting. States she feels. Dizzy, off balance, no nausea. States she has been told she is having a panic attack. States in the last few weeks it has gotten worse. Patient states no problems while she is getting down. Only when she is open spaces and not around objects. No fluttering feeling or racing heart in chest. Doesn't "take her breath away". No weakness, numbness, tingling, or vision changes. Patient states she is  drinking water. No decreased mobility in neck. No dizziness when changing position.    Limitations  Walking    Patient Stated Goals  She wants to walk without her RW and be able to walk in open spaces.    Currently in Pain?  No/denies    Multiple Pain Sites  No         NEUROMUSCULAR RE-EDUCATION  1/2 foam flat side up and balance with head turns left and right feet apart and feet together,x 2 mins  tandem standing on 1/2 foamflat side down with ball catch, and bounding ball and ball toss x 2 mins  side stepping left and right in parallel bars 10 feet x 3  step ups from foam  to 6 inch stool x 20 bilateralwith CGA  Side stepping with air foam and CGA x 10 x 4 laps  Tandem stand on foam with rotation and ball hold left and right x 10   Tapping from floor to stepping stones x 20   Patient needs occasional verbal cueing to improve posture and cueing to correctly perform exercises slowly, holding at end of range to increase motor firing of desired muscle to encourage fatigue.                      PT Education - 08/23/17 1309    Education Details  HEP    Person(s) Educated  Patient    Comprehension  Verbalized understanding       PT Short Term Goals - 07/31/17 1731      PT SHORT TERM GOAL #1   Title  Patient will increase BLE gross strength to 4+/5 as to improve functional strength for independent gait, increased standing tolerance and increased ADL ability.    Time  4    Period  Weeks    Status  New    Target Date  08/28/17        PT Long Term Goals - 07/31/17 1713      PT LONG TERM GOAL #1   Title  Patient will be independent in home exercise program to improve strength/mobility for better functional independence with ADLs.    Time  8    Period  Weeks    Status  New    Target Date  09/25/17      PT LONG TERM GOAL #2   Title   Patient (< 23 years old) will complete five times sit to stand test in < 10 seconds indicating an  increased LE strength and improved balance.    Time  8    Period  Weeks    Status  New  Target Date  09/25/17      PT LONG TERM GOAL #3   Title  Patient will increase six minute walk test distance to >1000 for progression to community ambulator and improve gait ability    Time  8    Period  Weeks    Status  New    Target Date  09/25/17      PT LONG TERM GOAL #4   Title  Patient will increase 10 meter walk test to >1.75m/s as to improve gait speed for better community ambulation and to reduce fall risk.    Time  8    Period  Weeks    Status  New    Target Date  09/25/17            Plan - 08/23/17 1311    Clinical Impression Statement  Patient demonstrates improved stability and strength allowing patient to perform short duration standing interventions with rest periods.  Patient performs beginning standing dynamic standing balance exercises to shift weight and perform single leg standing activities with mod assist. Patient fatigues quickly with exercises requiring rest breaks at this time. Patient will continue to benefit from skilled physical therapy to improve pain and mobility.    Rehab Potential  Good    PT Frequency  2x / week    PT Duration  8 weeks    PT Treatment/Interventions  Patient/family education;Therapeutic activities;Therapeutic exercise;Functional mobility training;Balance training;Neuromuscular re-education    PT Next Visit Plan  strengthening closed chain    Consulted and Agree with Plan of Care  Patient       Patient will benefit from skilled therapeutic intervention in order to improve the following deficits and impairments:  Abnormal gait, Decreased balance, Decreased endurance, Decreased mobility, Difficulty walking, Obesity, Decreased strength  Visit Diagnosis: Difficulty in walking, not elsewhere classified  Muscle weakness (generalized)  Abnormality of gait     Problem List Patient Active Problem List   Diagnosis Date Noted  . Elevated  alkaline phosphatase level 05/20/2017  . Goals of care, counseling/discussion 05/14/2017  . Chronic renal disease, stage III ([place]) 07/08/2015  . Iron deficiency anemia 07/08/2015  . Acute renal failure superimposed on stage 3 chronic kidney disease ([place]) 10/22/2014  . History of breast cancer 10/22/2014  . Elevated troponin 10/22/2014  . Pulmonary embolus ([place])   . Sepsis ([place]) 10/18/2014  . Pulmonary emboli ([place]) 10/18/2014  . Pulmonary embolism ([place]) 10/18/2014  . Candidiasis of breast 07/06/2014  . Malignant neoplasm of right female breast ([place]) 07/24/2012    Alanson Puls, PT DPT 08/23/2017, 1:12 PM  [place] MAIN [facility] SERVICES [address] [place], [place], [address] Phone: [phone]   Fax:  [phone]  Name: Nicole Schmidt MRN: [id number] Date of Birth: [birth year]

## 2017-08-28 ENCOUNTER — Ambulatory Visit: Payer: BLUE CROSS/BLUE SHIELD | Admitting: Physical Therapy

## 2017-08-28 ENCOUNTER — Encounter: Payer: Self-pay | Admitting: Physical Therapy

## 2017-08-28 DIAGNOSIS — R262 Difficulty in walking, not elsewhere classified: Secondary | ICD-10-CM

## 2017-08-28 DIAGNOSIS — M6281 Muscle weakness (generalized): Secondary | ICD-10-CM

## 2017-08-28 DIAGNOSIS — R269 Unspecified abnormalities of gait and mobility: Secondary | ICD-10-CM

## 2017-08-28 NOTE — Therapy (Signed)
[place] MAIN [facility] SERVICES [address] [place], [place], [address] Phone: [phone]   Fax:  [phone]  Physical Therapy Treatment  Patient Details  Name: Nicole Schmidt MRN: [id number] Date of Birth: Dec 31, 1965 Referring Provider: Anabel Bene   Encounter Date: 08/28/2017  PT End of Session - 08/28/17 1110    Visit Number  8    Number of Visits  17    Date for PT Re-Evaluation  09/25/17    PT Start Time  1100    PT Stop Time  1140    PT Time Calculation (min)  40 min    Equipment Utilized During Treatment  Gait belt    Activity Tolerance  Patient tolerated treatment well    Behavior During Therapy  [facility] for tasks assessed/performed       Past Medical History:  Diagnosis Date  . Anemia   . Arthritis   . Breast cancer of upper-inner quadrant of right female breast ([place])    Right breast invasive CA and DCIS , T1,N0,M0. Er/PR pos, her 2 negative  . Diabetes mellitus without complication ([place])   . Gout   . Gout   . Hypertension   . Menopause    age 52  . Pulmonary embolism ([place]) 10/2014  . Renal insufficiency   . Thyroid goiter     Past Surgical History:  Procedure Laterality Date  . BREAST BIOPSY Right 2014   breast ca  . BREAST EXCISIONAL BIOPSY Right 2014   breast ca  . BREAST SURGERY Right 2014   with sentinel node bx subareaolar duct excision  . COLONOSCOPY WITH PROPOFOL N/A 01/30/2017   Procedure: COLONOSCOPY WITH PROPOFOL;  Surgeon: Christene Lye, MD;  Location: [facility] ENDOSCOPY;  Service: Endoscopy;  Laterality: N/A;    There were no vitals filed for this visit.  Subjective Assessment - 08/28/17 1110    Subjective  Patient is doing well, she is doing some of her  HEP, she says she does not have the same space at home as she does here.. Reports no pain.    Pertinent History  Patient receintly is not working due to not being able to walk without AD. according to MD note--Patient states that she has  been experiencing dizziness while in open spaces for the last 4 years but in last couple of weeks her symptoms have increased. She states that if she is around objects that she can hold on to she is ok, but if she has nothing to hold on to she will be dizzy. Patient states that her symptoms will last a few seconds that make her feel dizzy and off balance and she states that it lasts long enough to make her scared. She has seen by ENT Dr. Tami Ribas, he completed tilt table testing and she states she was told that her test was normal. Dr. Tami Ribas has ordered for patient to start physical therapy next week. Her PCP checked her carotid arteries and came up negative as well per patient. Patient was prescribed buspirone 10 mg every 12 hours. Patient states that both her PCP and Dr. Tami Ribas feel that patient could be having panic attacks. Patient does not feel that she is having panic attacks. She was also prescribed xanax by her PCP yesterday but has not yet picked up medication and does not know if she needs to take this medication. Dr. Ronnald Collum her PCP has also ordered a psychiatry and she is scheduled to see her on May  20th. Patient is noted to be ambulating with assistance from walker. Patient states that walking with walker makes her feel more secure. She denies any recent falls. States she is fine walking and with dizziness if objects are around her. States that if someone is walking with her and she is able to touch them she will be ok. Dizziness doesn't cause her to fall. Per patient it is very short lasting. States she feels. Dizzy, off balance, no nausea. States she has been told she is having a panic attack. States in the last few weeks it has gotten worse. Patient states no problems while she is getting down. Only when she is open spaces and not around objects. No fluttering feeling or racing heart in chest. Doesn't "take her breath away". No weakness, numbness, tingling, or vision changes. Patient states she is  drinking water. No decreased mobility in neck. No dizziness when changing position.    Limitations  Walking    Patient Stated Goals  She wants to walk without her RW and be able to walk in open spaces.    Currently in Pain?  No/denies    Multiple Pain Sites  No         NEUROMUSCULAR RE-EDUCATION  1/2 foam flat side up and balance with head turns left and right feet apart and feet together,x 2 mins  side stepping left and right in parallel bars 10 feet x 3  step ups from foam  to 6 inch stool x 20 bilateralwith CGA  TM side stepping . 2 miles/ hour x 2 mins  TM walking for 3 mins . 6 miles / hour   Tapping from floor to stepping stones x 20   Gait training with 1 loftstrand crutch with wall 5 feet away x 200 feet x 2   Patient needs occasional verbal cueing to improve posture and cueing to correctly perform exercises slowly, holding at end of range to increase motor firing of desired muscle to encourage fatigue.                       PT Education - 08/28/17 1110    Education Details  HEP    Person(s) Educated  Patient    Methods  Explanation    Comprehension  Verbalized understanding       PT Short Term Goals - 07/31/17 1731      PT SHORT TERM GOAL #1   Title  Patient will increase BLE gross strength to 4+/5 as to improve functional strength for independent gait, increased standing tolerance and increased ADL ability.    Time  4    Period  Weeks    Status  New    Target Date  08/28/17        PT Long Term Goals - 07/31/17 1713      PT LONG TERM GOAL #1   Title  Patient will be independent in home exercise program to improve strength/mobility for better functional independence with ADLs.    Time  8    Period  Weeks    Status  New    Target Date  09/25/17      PT LONG TERM GOAL #2   Title   Patient (< 54 years old) will complete five times sit to stand test in < 10 seconds indicating an increased LE strength and improved balance.     Time  8    Period  Weeks    Status  New  Target Date  09/25/17      PT LONG TERM GOAL #3   Title  Patient will increase six minute walk test distance to >1000 for progression to community ambulator and improve gait ability    Time  8    Period  Weeks    Status  New    Target Date  09/25/17      PT LONG TERM GOAL #4   Title  Patient will increase 10 meter walk test to >1.25m/s as to improve gait speed for better community ambulation and to reduce fall risk.    Time  8    Period  Weeks    Status  New    Target Date  09/25/17            Plan - 08/28/17 1111    Clinical Impression Statement  Pt is making improvements in BLE strength and balance as evidenced by improvements in gait. Patient is ambulating in the hall with loftstrand crutch and is able to stand for 40 min during treatment,. Patient demonstrates fear with ambulation. Patient will benefit from continued skilled PT interventions for improved strength, balance, and QOL    Rehab Potential  Good    PT Frequency  2x / week    PT Duration  8 weeks    PT Treatment/Interventions  Patient/family education;Therapeutic activities;Therapeutic exercise;Functional mobility training;Balance training;Neuromuscular re-education    PT Next Visit Plan  strengthening closed chain    Consulted and Agree with Plan of Care  Patient       Patient will benefit from skilled therapeutic intervention in order to improve the following deficits and impairments:  Abnormal gait, Decreased balance, Decreased endurance, Decreased mobility, Difficulty walking, Obesity, Decreased strength  Visit Diagnosis: Difficulty in walking, not elsewhere classified  Muscle weakness (generalized)  Abnormality of gait     Problem List Patient Active Problem List   Diagnosis Date Noted  . Elevated alkaline phosphatase level 05/20/2017  . Goals of care, counseling/discussion 05/14/2017  . Chronic renal disease, stage III ([place]) 07/08/2015  . Iron  deficiency anemia 07/08/2015  . Acute renal failure superimposed on stage 3 chronic kidney disease ([place]) 10/22/2014  . History of breast cancer 10/22/2014  . Elevated troponin 10/22/2014  . Pulmonary embolus ([place])   . Sepsis ([place]) 10/18/2014  . Pulmonary emboli ([place]) 10/18/2014  . Pulmonary embolism ([place]) 10/18/2014  . Candidiasis of breast 07/06/2014  . Malignant neoplasm of right female breast ([place]) 07/24/2012    Alanson Puls, PT DPT 08/28/2017, 11:13 AM  [place] MAIN [facility] SERVICES [address] [place], [place], [address] Phone: [phone]   Fax:  [phone]  Name: Nicole Schmidt MRN: [id number] Date of Birth: 25-Oct-1965

## 2017-08-30 ENCOUNTER — Encounter: Payer: Self-pay | Admitting: Physical Therapy

## 2017-08-30 ENCOUNTER — Ambulatory Visit: Payer: BLUE CROSS/BLUE SHIELD | Admitting: Physical Therapy

## 2017-08-30 DIAGNOSIS — M6281 Muscle weakness (generalized): Secondary | ICD-10-CM

## 2017-08-30 DIAGNOSIS — R262 Difficulty in walking, not elsewhere classified: Secondary | ICD-10-CM

## 2017-08-30 DIAGNOSIS — R269 Unspecified abnormalities of gait and mobility: Secondary | ICD-10-CM

## 2017-08-30 NOTE — Therapy (Signed)
[place] MAIN [facility] SERVICES [address] [place], [place], [address] Phone: [phone]   Fax:  [phone]  Physical Therapy Treatment Physical Therapy Progress Note   Dates of reporting period  07/31/17 to  08/30/17  Patient Details  Name: Nicole Schmidt MRN: [id number] Date of Birth: 1965-09-04 Referring Provider: Anabel Bene   Encounter Date: 08/30/2017  PT End of Session - 08/30/17 1114    Visit Number  9    Number of Visits  17    Date for PT Re-Evaluation  09/25/17    PT Start Time  1105    PT Stop Time  1145    PT Time Calculation (min)  40 min    Equipment Utilized During Treatment  Gait belt    Activity Tolerance  Patient tolerated treatment well    Behavior During Therapy  WFL for tasks assessed/performed       Past Medical History:  Diagnosis Date  . Anemia   . Arthritis   . Breast cancer of upper-inner quadrant of right female breast ([place])    Right breast invasive CA and DCIS , T1,N0,M0. Er/PR pos, her 2 negative  . Diabetes mellitus without complication ([place])   . Gout   . Gout   . Hypertension   . Menopause    age 52  . Pulmonary embolism ([place]) 10/2014  . Renal insufficiency   . Thyroid goiter     Past Surgical History:  Procedure Laterality Date  . BREAST BIOPSY Right 2014   breast ca  . BREAST EXCISIONAL BIOPSY Right 2014   breast ca  . BREAST SURGERY Right 2014   with sentinel node bx subareaolar duct excision  . COLONOSCOPY WITH PROPOFOL N/A 01/30/2017   Procedure: COLONOSCOPY WITH PROPOFOL;  Surgeon: Christene Lye, MD;  Location: [facility] ENDOSCOPY;  Service: Endoscopy;  Laterality: N/A;    There were no vitals filed for this visit.  Subjective Assessment - 08/30/17 1113    Subjective  Patient is doing well, she is doing some of her  HEP, she says she does not have the same space at home as she does here.. Reports no pain.    Pertinent History  Patient receintly is not working due to not  being able to walk without AD. according to MD note--Patient states that she has been experiencing dizziness while in open spaces for the last 4 years but in last couple of weeks her symptoms have increased. She states that if she is around objects that she can hold on to she is ok, but if she has nothing to hold on to she will be dizzy. Patient states that her symptoms will last a few seconds that make her feel dizzy and off balance and she states that it lasts long enough to make her scared. She has seen by ENT Dr. Tami Ribas, he completed tilt table testing and she states she was told that her test was normal. Dr. Tami Ribas has ordered for patient to start physical therapy next week. Her PCP checked her carotid arteries and came up negative as well per patient. Patient was prescribed buspirone 10 mg every 12 hours. Patient states that both her PCP and Dr. Tami Ribas feel that patient could be having panic attacks. Patient does not feel that she is having panic attacks. She was also prescribed xanax by her PCP yesterday but has not yet picked up medication and does not know if she needs to take this medication. Dr. Ronnald Collum her  PCP has also ordered a psychiatry and she is scheduled to see her on May 20th. Patient is noted to be ambulating with assistance from walker. Patient states that walking with walker makes her feel more secure. She denies any recent falls. States she is fine walking and with dizziness if objects are around her. States that if someone is walking with her and she is able to touch them she will be ok. Dizziness doesn't cause her to fall. Per patient it is very short lasting. States she feels. Dizzy, off balance, no nausea. States she has been told she is having a panic attack. States in the last few weeks it has gotten worse. Patient states no problems while she is getting down. Only when she is open spaces and not around objects. No fluttering feeling or racing heart in chest. Doesn't "take her breath  away". No weakness, numbness, tingling, or vision changes. Patient states she is drinking water. No decreased mobility in neck. No dizziness when changing position.    Limitations  Walking    Patient Stated Goals  She wants to walk without her RW and be able to walk in open spaces.    Currently in Pain?  No/denies    Multiple Pain Sites  No      treatment:  OUTCOME MEASURES: Completed with progress towards all goals TEST Outcome Interpretation  5 times sit<>stand 14.10 sec >54 yo, >15 sec indicates increased risk for falls  10 meter walk test     .80            m/s <1.0 m/s indicates increased risk for falls; limited community ambulator      6 minute walk test  530             Feet 1000 feet is Visual merchandiser: Step over hurdle x 10 x 2, UE support  foam flat side up and balance with head turns left and right feet apart and feet together,cues for better posture x 2 mins Side stepping on blue balance beam left and right x 10 lengths, cues for going slowly and she needs UE support with LOB backwards x 10 x 3   step ups from floor to 6 inch stool x 20 bilateral,  cues for long steps and standing posture correction Tap ups from floor to 6 inch stool x 20 bilateral,  cues for long steps and standing posture correction                         PT Education - 08/30/17 1114    Education Details  HEP    Person(s) Educated  Patient    Methods  Explanation    Comprehension  Verbalized understanding       PT Short Term Goals - 07/31/17 1731      PT SHORT TERM GOAL #1   Title  Patient will increase BLE gross strength to 4+/5 as to improve functional strength for independent gait, increased standing tolerance and increased ADL ability.    Time  4    Period  Weeks    Status  New    Target Date  08/28/17        PT Long Term Goals - 08/30/17 1115      PT LONG TERM GOAL #1   Title  Patient will be independent in home exercise  program to improve strength/mobility for better functional independence  with ADLs.    Time  8    Period  Weeks    Status  Partially Met    Target Date  09/25/17      PT LONG TERM GOAL #2   Title   Patient (< 14 years old) will complete five times sit to stand test in < 10 seconds indicating an increased LE strength and improved balance.    Baseline  14.10 sec    Time  8    Period  Weeks    Status  Partially Met    Target Date  09/25/17      PT LONG TERM GOAL #3   Title  Patient will increase six minute walk test distance to >1000 for progression to community ambulator and improve gait ability    Baseline  530 ft    Time  8    Period  Weeks    Status  Partially Met    Target Date  09/25/17      PT LONG TERM GOAL #4   Title  Patient will increase 10 meter walk test to >1.27ms as to improve gait speed for better community ambulation and to reduce fall risk.    Baseline  .80 m/sec    Time  8    Period  Weeks    Status  New    Target Date  09/25/17            Plan - 08/30/17 1114    Clinical Impression Statement Patient's condition has the potential to improve in response to therapy. Maximum improvement is yet to be obtained. The anticipated improvement is attainable and reasonable in a generally predictable time. Start date of reporting period 07/31/17  end date of reporting period 08/30/17. Patient reports being able to stand with her eyes closed for about a minute.   Patient performed therapeutic exercise to increase LE strength and performed gait training with loftstrand crutch for 300 feet x 3 with wall nearby and SBA with some anxiety during open areas. Patient will continue to benefit from skilled PT to improve strength and mobility.     Rehab Potential  Good    PT Frequency  2x / week    PT Duration  8 weeks    PT Treatment/Interventions  Patient/family education;Therapeutic activities;Therapeutic exercise;Functional mobility training;Balance training;Neuromuscular  re-education    PT Next Visit Plan  strengthening closed chain    Consulted and Agree with Plan of Care  Patient       Patient will benefit from skilled therapeutic intervention in order to improve the following deficits and impairments:  Abnormal gait, Decreased balance, Decreased endurance, Decreased mobility, Difficulty walking, Obesity, Decreased strength  Visit Diagnosis: Difficulty in walking, not elsewhere classified  Muscle weakness (generalized)  Abnormality of gait     Problem List Patient Active Problem List   Diagnosis Date Noted  . Elevated alkaline phosphatase level 05/20/2017  . Goals of care, counseling/discussion 05/14/2017  . Chronic renal disease, stage III ([place] 07/08/2015  . Iron deficiency anemia 07/08/2015  . Acute renal failure superimposed on stage 3 chronic kidney disease ([place] 10/22/2014  . History of breast cancer 10/22/2014  . Elevated troponin 10/22/2014  . Pulmonary [place]us ([place]   . Sepsis ([place] 10/18/2014  . Pulmonary emboli ([place] 10/18/2014  . Pulmonary embolism ([place] 10/18/2014  . Candidiasis of breast 07/06/2014  . Malignant neoplasm of right female breast ([place] 07/24/2012    MAlanson Puls PT DPT 08/30/2017, 11:36 AM  [place]  [place] MAIN [facility] SERVICES [place], [place], [address] Phone: [phone]   Fax:  [phone]  Name: Nicole Schmidt MRN: [id number] Date of Birth: [birth year]

## 2017-09-04 ENCOUNTER — Encounter: Payer: Self-pay | Admitting: Physical Therapy

## 2017-09-04 ENCOUNTER — Ambulatory Visit: Payer: BLUE CROSS/BLUE SHIELD | Admitting: Physical Therapy

## 2017-09-04 DIAGNOSIS — R262 Difficulty in walking, not elsewhere classified: Secondary | ICD-10-CM

## 2017-09-04 DIAGNOSIS — M6281 Muscle weakness (generalized): Secondary | ICD-10-CM

## 2017-09-04 DIAGNOSIS — R269 Unspecified abnormalities of gait and mobility: Secondary | ICD-10-CM

## 2017-09-04 NOTE — Therapy (Signed)
[place] MAIN [facility] SERVICES [address] [place], [place], [address] Phone: [phone]   Fax:  [phone]  Physical Therapy Treatment  Patient Details  Name: DALAYNA LAUTER MRN: [id number] Date of Birth: January 02, 1966 Referring Provider: Anabel Bene   Encounter Date: 09/04/2017  PT End of Session - 09/04/17 1356    Visit Number  10    Number of Visits  17    Date for PT Re-Evaluation  09/25/17    PT Start Time  0145    PT Stop Time  0230    PT Time Calculation (min)  45 min    Equipment Utilized During Treatment  Gait belt    Activity Tolerance  Patient tolerated treatment well    Behavior During Therapy  [facility] for tasks assessed/performed       Past Medical History:  Diagnosis Date  . Anemia   . Arthritis   . Breast cancer of upper-inner quadrant of right female breast ([place])    Right breast invasive CA and DCIS , T1,N0,M0. Er/PR pos, her 2 negative  . Diabetes mellitus without complication ([place])   . Gout   . Gout   . Hypertension   . Menopause    age 52  . Pulmonary embolism ([place]) 10/2014  . Renal insufficiency   . Thyroid goiter     Past Surgical History:  Procedure Laterality Date  . BREAST BIOPSY Right 2014   breast ca  . BREAST EXCISIONAL BIOPSY Right 2014   breast ca  . BREAST SURGERY Right 2014   with sentinel node bx subareaolar duct excision  . COLONOSCOPY WITH PROPOFOL N/A 01/30/2017   Procedure: COLONOSCOPY WITH PROPOFOL;  Surgeon: Christene Lye, MD;  Location: [facility] ENDOSCOPY;  Service: Endoscopy;  Laterality: N/A;    There were no vitals filed for this visit.  Subjective Assessment - 09/04/17 1355    Subjective  Patient is doing well, she is doing some of her  HEP, she says she does not have the same space at home as she does here.. Reports no pain.    Pertinent History  Patient receintly is not working due to not being able to walk without AD. according to MD note--Patient states that she has  been experiencing dizziness while in open spaces for the last 4 years but in last couple of weeks her symptoms have increased. She states that if she is around objects that she can hold on to she is ok, but if she has nothing to hold on to she will be dizzy. Patient states that her symptoms will last a few seconds that make her feel dizzy and off balance and she states that it lasts long enough to make her scared. She has seen by ENT Dr. Tami Ribas, he completed tilt table testing and she states she was told that her test was normal. Dr. Tami Ribas has ordered for patient to start physical therapy next week. Her PCP checked her carotid arteries and came up negative as well per patient. Patient was prescribed buspirone 10 mg every 12 hours. Patient states that both her PCP and Dr. Tami Ribas feel that patient could be having panic attacks. Patient does not feel that she is having panic attacks. She was also prescribed xanax by her PCP yesterday but has not yet picked up medication and does not know if she needs to take this medication. Dr. Ronnald Collum her PCP has also ordered a psychiatry and she is scheduled to see her on May  20th. Patient is noted to be ambulating with assistance from walker. Patient states that walking with walker makes her feel more secure. She denies any recent falls. States she is fine walking and with dizziness if objects are around her. States that if someone is walking with her and she is able to touch them she will be ok. Dizziness doesn't cause her to fall. Per patient it is very short lasting. States she feels. Dizzy, off balance, no nausea. States she has been told she is having a panic attack. States in the last few weeks it has gotten worse. Patient states no problems while she is getting down. Only when she is open spaces and not around objects. No fluttering feeling or racing heart in chest. Doesn't "take her breath away". No weakness, numbness, tingling, or vision changes. Patient states she is  drinking water. No decreased mobility in neck. No dizziness when changing position.    Limitations  Walking    Patient Stated Goals  She wants to walk without her RW and be able to walk in open spaces.    Currently in Pain?  No/denies    Multiple Pain Sites  No         NEUROMUSCULAR RE-EDUCATION  1/2 foam flat side up and balance with head turns left and right feet apart and feet together,x 2 mins  side stepping left and right in parallel bars 10 feet x 3  step ups fromfoamto 6 inch stool x 20 bilateralwith CGA  Ambulation with loftstrand crutch sidestepping away from the wall x 40 feet x 4   Ambulation with loftstrand cruch and 100 feet x 6 away from wall 3 feet  Tapping from floor to stepping stones x 20  Gait training with 1 loftstrand crutch with wall 5 feet away x 200 feet x 2   Patient needs occasional verbal cueing to improve posture and cueing to correctly perform exercises slowly, holding at end of range to increase motor firing of desired muscle to encourage fatigue.                       PT Education - 09/04/17 1355    Education Details  HEP    Person(s) Educated  Patient    Methods  Explanation    Comprehension  Verbalized understanding       PT Short Term Goals - 07/31/17 1731      PT SHORT TERM GOAL #1   Title  Patient will increase BLE gross strength to 4+/5 as to improve functional strength for independent gait, increased standing tolerance and increased ADL ability.    Time  4    Period  Weeks    Status  New    Target Date  08/28/17        PT Long Term Goals - 08/30/17 1115      PT LONG TERM GOAL #1   Title  Patient will be independent in home exercise program to improve strength/mobility for better functional independence with ADLs.    Time  8    Period  Weeks    Status  Partially Met    Target Date  09/25/17      PT LONG TERM GOAL #2   Title   Patient (< 54 years old) will complete five times sit to stand  test in < 10 seconds indicating an increased LE strength and improved balance.    Baseline  14.10 sec    Time  8  Period  Weeks    Status  Partially Met    Target Date  09/25/17      PT LONG TERM GOAL #3   Title  Patient will increase six minute walk test distance to >1000 for progression to community ambulator and improve gait ability    Baseline  530 ft    Time  8    Period  Weeks    Status  Partially Met    Target Date  09/25/17      PT LONG TERM GOAL #4   Title  Patient will increase 10 meter walk test to >1.56ms as to improve gait speed for better community ambulation and to reduce fall risk.    Baseline  .80 m/sec    Time  8    Period  Weeks    Status  New    Target Date  09/25/17            Plan - 09/04/17 1357    Clinical Impression Statement  Patient instructed in gait training on level surfaces without Ad with rest periods standing. Patient is able to perform intermediate LE exercises in supine and standing open and closed chain, with no pain behaviors and resting throughout session. Patient performs dynamic standing balance training with uneven surfaces with CGA. Patient will continue to benefit from skilled PT to improve strength, balance and gait. Patient continues to have LE weakness and decreased static and dynamic standing balance    Rehab Potential  Good    PT Frequency  2x / week    PT Duration  8 weeks    PT Treatment/Interventions  Patient/family education;Therapeutic activities;Therapeutic exercise;Functional mobility training;Balance training;Neuromuscular re-education    PT Next Visit Plan  strengthening closed chain    Consulted and Agree with Plan of Care  Patient       Patient will benefit from skilled therapeutic intervention in order to improve the following deficits and impairments:  Abnormal gait, Decreased balance, Decreased endurance, Decreased mobility, Difficulty walking, Obesity, Decreased strength  Visit Diagnosis: Difficulty in  walking, not elsewhere classified  Muscle weakness (generalized)  Abnormality of gait     Problem List Patient Active Problem List   Diagnosis Date Noted  . Elevated alkaline phosphatase level 05/20/2017  . Goals of care, counseling/discussion 05/14/2017  . Chronic renal disease, stage III ([place] 07/08/2015  . Iron deficiency anemia 07/08/2015  . Acute renal failure superimposed on stage 3 chronic kidney disease ([place] 10/22/2014  . History of breast cancer 10/22/2014  . Elevated troponin 10/22/2014  . Pulmonary embolus ([place]   . Sepsis ([place] 10/18/2014  . Pulmonary emboli ([place] 10/18/2014  . Pulmonary embolism ([place] 10/18/2014  . Candidiasis of breast 07/06/2014  . Malignant neoplasm of right female breast ([place] 07/24/2012    MAlanson Puls PT DPT 09/04/2017, 2:31 PM  [place] [facility] [address] [place] [address]Phone: [phone]  Fax:  [phone] Name: AKATHYE CIPRIANIMRN: [id number] of Birth: 7December 16, 1967

## 2017-09-11 ENCOUNTER — Ambulatory Visit: Payer: BLUE CROSS/BLUE SHIELD | Admitting: Physical Therapy

## 2017-09-11 ENCOUNTER — Encounter: Payer: Self-pay | Admitting: Physical Therapy

## 2017-09-11 DIAGNOSIS — R269 Unspecified abnormalities of gait and mobility: Secondary | ICD-10-CM

## 2017-09-11 DIAGNOSIS — R262 Difficulty in walking, not elsewhere classified: Secondary | ICD-10-CM

## 2017-09-11 DIAGNOSIS — M6281 Muscle weakness (generalized): Secondary | ICD-10-CM

## 2017-09-11 NOTE — Therapy (Signed)
[place] MAIN [facility] SERVICES [address] [place], [place], [address] Phone: [phone]   Fax:  [phone]  Physical Therapy Treatment  Patient Details  Name: Nicole Schmidt MRN: [id number] Date of Birth: December 01, 1965 Referring Provider: Anabel Bene   Encounter Date: 09/11/2017  PT End of Session - 09/11/17 1156    Visit Number  11    Number of Visits  17    Date for PT Re-Evaluation  09/25/17    PT Start Time  3662    PT Stop Time  1225    PT Time Calculation (min)  40 min    Equipment Utilized During Treatment  Gait belt    Activity Tolerance  Patient tolerated treatment well    Behavior During Therapy  [facility] for tasks assessed/performed       Past Medical History:  Diagnosis Date  . Anemia   . Arthritis   . Breast cancer of upper-inner quadrant of right female breast ([place])    Right breast invasive CA and DCIS , T1,N0,M0. Er/PR pos, her 2 negative  . Diabetes mellitus without complication ([place])   . Gout   . Gout   . Hypertension   . Menopause    age 52  . Pulmonary embolism ([place]) 10/2014  . Renal insufficiency   . Thyroid goiter     Past Surgical History:  Procedure Laterality Date  . BREAST BIOPSY Right 2014   breast ca  . BREAST EXCISIONAL BIOPSY Right 2014   breast ca  . BREAST SURGERY Right 2014   with sentinel node bx subareaolar duct excision  . COLONOSCOPY WITH PROPOFOL N/A 01/30/2017   Procedure: COLONOSCOPY WITH PROPOFOL;  Surgeon: Christene Lye, MD;  Location: [facility] ENDOSCOPY;  Service: Endoscopy;  Laterality: N/A;    There were no vitals filed for this visit.  Subjective Assessment - 09/11/17 1156    Subjective  Patient is doing well, she is doing some of her  HEP, she says she does not have the same space at home as she does here.. Reports no pain.    Pertinent History  Patient receintly is not working due to not being able to walk without AD. according to MD note--Patient states that she has  been experiencing dizziness while in open spaces for the last 4 years but in last couple of weeks her symptoms have increased. She states that if she is around objects that she can hold on to she is ok, but if she has nothing to hold on to she will be dizzy. Patient states that her symptoms will last a few seconds that make her feel dizzy and off balance and she states that it lasts long enough to make her scared. She has seen by ENT Dr. Tami Ribas, he completed tilt table testing and she states she was told that her test was normal. Dr. Tami Ribas has ordered for patient to start physical therapy next week. Her PCP checked her carotid arteries and came up negative as well per patient. Patient was prescribed buspirone 10 mg every 12 hours. Patient states that both her PCP and Dr. Tami Ribas feel that patient could be having panic attacks. Patient does not feel that she is having panic attacks. She was also prescribed xanax by her PCP yesterday but has not yet picked up medication and does not know if she needs to take this medication. Dr. Ronnald Collum her PCP has also ordered a psychiatry and she is scheduled to see her on May  20th. Patient is noted to be ambulating with assistance from walker. Patient states that walking with walker makes her feel more secure. She denies any recent falls. States she is fine walking and with dizziness if objects are around her. States that if someone is walking with her and she is able to touch them she will be ok. Dizziness doesn't cause her to fall. Per patient it is very short lasting. States she feels. Dizzy, off balance, no nausea. States she has been told she is having a panic attack. States in the last few weeks it has gotten worse. Patient states no problems while she is getting down. Only when she is open spaces and not around objects. No fluttering feeling or racing heart in chest. Doesn't "take her breath away". No weakness, numbness, tingling, or vision changes. Patient states she is  drinking water. No decreased mobility in neck. No dizziness when changing position.    Limitations  Walking    Patient Stated Goals  She wants to walk without her RW and be able to walk in open spaces.    Currently in Pain?  No/denies    Multiple Pain Sites  No       NEUROMUSCULAR RE-EDUCATION  Tandem standing on 1/2 foam without UE support   Standing on 1/2 foam horizontal wih intemittent UE support  Foam and tapping stepping stones x 20 x 2   1/2 foam flat side up and balance with head turns left and right feet apart and feet together,x 2 mins  side stepping left and right in parallel bars 10 feet x 3  step ups fromfoamto 6 inch stool x 20 bilateralwith CGA  Ambulation with loftstrand crutch sidestepping away from the wall x 40 feet x 4   Ambulation with loftstrand cruch and 100 feet x 6 away from wall 3 feet  Tapping from floor to stepping stones x 20  LAQ with 3 lbs x 20  SAQ with 3 lbs x 20  Hip abd/add with 3 lbs x 20   hooklying marching with 3 lbs x 20   SLR x 20 BLE  sidelying hip abd x 20 bLE  Gait training with 1 loftstrand crutch with wall 5 feet away x 200 feet x 2  Patient needs occasional verbal cueing to improve posture and cueing to correctly perform exercises slowly, holding at end of range to increase motor firing of desired muscle to encourage fatigue                         PT Education - 09/11/17 1156    Education Details  HEP    Person(s) Educated  Patient    Methods  Explanation    Comprehension  Verbalized understanding       PT Short Term Goals - 07/31/17 1731      PT SHORT TERM GOAL #1   Title  Patient will increase BLE gross strength to 4+/5 as to improve functional strength for independent gait, increased standing tolerance and increased ADL ability.    Time  4    Period  Weeks    Status  New    Target Date  08/28/17        PT Long Term Goals - 08/30/17 1115      PT LONG TERM GOAL #1    Title  Patient will be independent in home exercise program to improve strength/mobility for better functional independence with ADLs.    Time  8  Period  Weeks    Status  Partially Met    Target Date  09/25/17      PT LONG TERM GOAL #2   Title   Patient (< 33 years old) will complete five times sit to stand test in < 10 seconds indicating an increased LE strength and improved balance.    Baseline  14.10 sec    Time  8    Period  Weeks    Status  Partially Met    Target Date  09/25/17      PT LONG TERM GOAL #3   Title  Patient will increase six minute walk test distance to >1000 for progression to community ambulator and improve gait ability    Baseline  530 ft    Time  8    Period  Weeks    Status  Partially Met    Target Date  09/25/17      PT LONG TERM GOAL #4   Title  Patient will increase 10 meter walk test to >1.28ms as to improve gait speed for better community ambulation and to reduce fall risk.    Baseline  .80 m/sec    Time  8    Period  Weeks    Status  New    Target Date  09/25/17            Plan - 09/11/17 1157    Clinical Impression Statement  Patient instructed in intermediate balance/strengthening exercise. Patient fatigues quickly requiring short rest breaks in between intermediate exercise. Patient instructed in advanced strengthening with increased repetition/resistance. Patient requires CGA for intermediate balance exercise especially with less rail assist. Patient would benefit from additional skilled PT intervention to improve balance/gait safety and reduce fall risk.    Rehab Potential  Good    PT Frequency  2x / week    PT Duration  8 weeks    PT Treatment/Interventions  Patient/family education;Therapeutic activities;Therapeutic exercise;Functional mobility training;Balance training;Neuromuscular re-education    PT Next Visit Plan  strengthening closed chain    Consulted and Agree with Plan of Care  Patient       Patient will benefit from  skilled therapeutic intervention in order to improve the following deficits and impairments:  Abnormal gait, Decreased balance, Decreased endurance, Decreased mobility, Difficulty walking, Obesity, Decreased strength  Visit Diagnosis: Difficulty in walking, not elsewhere classified  Muscle weakness (generalized)  Abnormality of gait     Problem List Patient Active Problem List   Diagnosis Date Noted  . Elevated alkaline phosphatase level 05/20/2017  . Goals of care, counseling/discussion 05/14/2017  . Chronic renal disease, stage III ([place] 07/08/2015  . Iron deficiency anemia 07/08/2015  . Acute renal failure superimposed on stage 3 chronic kidney disease ([place] 10/22/2014  . History of breast cancer 10/22/2014  . Elevated troponin 10/22/2014  . Pulmonary embolus ([place]   . Sepsis ([place] 10/18/2014  . Pulmonary emboli ([place] 10/18/2014  . Pulmonary embolism ([place] 10/18/2014  . Candidiasis of breast 07/06/2014  . Malignant neoplasm of right female breast ([place] 07/24/2012    MAlanson Puls PT DPT 09/11/2017, 11:59 AM  [place] [facility] [address] [place] [address]Phone: [phone]  Fax:  [phone] Name: AZANETA LIGHTCAPMRN: [id number] of Birth: [birth year]

## 2017-09-13 ENCOUNTER — Encounter: Payer: Self-pay | Admitting: Physical Therapy

## 2017-09-13 ENCOUNTER — Ambulatory Visit: Payer: BLUE CROSS/BLUE SHIELD | Attending: Neurology | Admitting: Physical Therapy

## 2017-09-13 DIAGNOSIS — R269 Unspecified abnormalities of gait and mobility: Secondary | ICD-10-CM

## 2017-09-13 DIAGNOSIS — M6281 Muscle weakness (generalized): Secondary | ICD-10-CM

## 2017-09-13 DIAGNOSIS — R262 Difficulty in walking, not elsewhere classified: Secondary | ICD-10-CM | POA: Diagnosis present

## 2017-09-13 NOTE — Therapy (Signed)
[place] MAIN [facility] SERVICES [address] [place], [place], [address] Phone: [phone]   Fax:  [phone]  Physical Therapy Treatment  Patient Details  Name: Nicole Schmidt MRN: [id number] Date of Birth: [birth year] Referring Provider: Anabel Bene   Encounter Date: 09/13/2017  PT End of Session - 09/13/17 1117    Visit Number  12    Number of Visits  17    Date for PT Re-Evaluation  09/25/17    PT Start Time  1105    PT Stop Time  1145    PT Time Calculation (min)  40 min    Equipment Utilized During Treatment  Gait belt    Activity Tolerance  Patient tolerated treatment well    Behavior During Therapy  [facility] for tasks assessed/performed       Past Medical History:  Diagnosis Date  . Anemia   . Arthritis   . Breast cancer of upper-inner quadrant of right female breast ([place])    Right breast invasive CA and DCIS , T1,N0,M0. Er/PR pos, her 2 negative  . Diabetes mellitus without complication ([place])   . Gout   . Gout   . Hypertension   . Menopause    age 52  . Pulmonary embolism ([place]) 10/2014  . Renal insufficiency   . Thyroid goiter     Past Surgical History:  Procedure Laterality Date  . BREAST BIOPSY Right 2014   breast ca  . BREAST EXCISIONAL BIOPSY Right 2014   breast ca  . BREAST SURGERY Right 2014   with sentinel node bx subareaolar duct excision  . COLONOSCOPY WITH PROPOFOL N/A 01/30/2017   Procedure: COLONOSCOPY WITH PROPOFOL;  Surgeon: Christene Lye, MD;  Location: [facility] ENDOSCOPY;  Service: Endoscopy;  Laterality: N/A;    There were no vitals filed for this visit.  Subjective Assessment - 09/13/17 1117    Subjective  Patient is doing well, she is doing some of her  HEP, she says she does not have the same space at home as she does here.. Reports no pain.    Pertinent History  Patient receintly is not working due to not being able to walk without AD. according to MD note--Patient states that she has  been experiencing dizziness while in open spaces for the last 4 years but in last couple of weeks her symptoms have increased. She states that if she is around objects that she can hold on to she is ok, but if she has nothing to hold on to she will be dizzy. Patient states that her symptoms will last a few seconds that make her feel dizzy and off balance and she states that it lasts long enough to make her scared. She has seen by ENT Dr. Tami Ribas, he completed tilt table testing and she states she was told that her test was normal. Dr. Tami Ribas has ordered for patient to start physical therapy next week. Her PCP checked her carotid arteries and came up negative as well per patient. Patient was prescribed buspirone 10 mg every 12 hours. Patient states that both her PCP and Dr. Tami Ribas feel that patient could be having panic attacks. Patient does not feel that she is having panic attacks. She was also prescribed xanax by her PCP yesterday but has not yet picked up medication and does not know if she needs to take this medication. Dr. Ronnald Collum her PCP has also ordered a psychiatry and she is scheduled to see her on May  20th. Patient is noted to be ambulating with assistance from walker. Patient states that walking with walker makes her feel more secure. She denies any recent falls. States she is fine walking and with dizziness if objects are around her. States that if someone is walking with her and she is able to touch them she will be ok. Dizziness doesn't cause her to fall. Per patient it is very short lasting. States she feels. Dizzy, off balance, no nausea. States she has been told she is having a panic attack. States in the last few weeks it has gotten worse. Patient states no problems while she is getting down. Only when she is open spaces and not around objects. No fluttering feeling or racing heart in chest. Doesn't "take her breath away". No weakness, numbness, tingling, or vision changes. Patient states she is  drinking water. No decreased mobility in neck. No dizziness when changing position.    Limitations  Walking    Patient Stated Goals  She wants to walk without her RW and be able to walk in open spaces.    Currently in Pain?  No/denies    Multiple Pain Sites  No          TREATMENT: Warm up on Nustep BUE/BLE level 2 x5 min with cues to increase steps per minute >50 for cardiovascular challenge;    Instructed patient in balance exercise: Standing on airex: Modified tandem with head turns s x15 with 2 rail assist  Feet together  with head turns s x15 with 2 rail assist  Feet together with 2 lb cane press outs  x15 with 2 rail assist  Alternate toe taps on 4inch step x10 with 1-0 rail assist- required mod VCs and visual cues for correct technique/sequencing.Patient had significant difficulty letting go of one rail often reaching out with both hands for rail support;   Patient required min verbal/tactile cues for correct exercise technique including cues to improve posture, improve foot position, and reduce rail assist for better balance challenge;   Standing on airex: Modified tandem stance: BUE trunk rotation from midline  x5 reps each direction with CGA for safety and cues to keep both hands on ball for better trunk rotation and balance challenge; Patient would start to loose balance and then would grab for the rail; Instructed patient to improve upper trunk control for less loss of balance;    Standing on 1/2 bolster: Balance  x10 reps with B rail assist with cues to keep knee straight for better ankle strategies and weight shift; Feet in neutral, BUE wand flexion x10 reps with mod A and cues to improve weight shift for better stance control; Patient often leans forward having difficulty with posterior weight shift   Gait training with loftstrand crutch 400 feet and getting far away from the wall for improved independence.   Supine hip abd/add x 15 x 2 BLE  Supine SLR with 3 lbs  x 10 x  2 BLE  hooklying hip abd/ER x 20 x 2  Hooklying marching with 3 lbs x 20   Bridging x 10 x 2  Clam sidelying x 15    Patient needs cues for correct technique and form, no reports of pain.               PT Education - 09/13/17 1117    Education Details  HEP    Person(s) Educated  Patient    Methods  Explanation    Comprehension  Verbalized understanding  PT Short Term Goals - 07/31/17 1731      PT SHORT TERM GOAL #1   Title  Patient will increase BLE gross strength to 4+/5 as to improve functional strength for independent gait, increased standing tolerance and increased ADL ability.    Time  4    Period  Weeks    Status  New    Target Date  08/28/17        PT Long Term Goals - 08/30/17 1115      PT LONG TERM GOAL #1   Title  Patient will be independent in home exercise program to improve strength/mobility for better functional independence with ADLs.    Time  8    Period  Weeks    Status  Partially Met    Target Date  09/25/17      PT LONG TERM GOAL #2   Title   Patient (< 43 years old) will complete five times sit to stand test in < 10 seconds indicating an increased LE strength and improved balance.    Baseline  14.10 sec    Time  8    Period  Weeks    Status  Partially Met    Target Date  09/25/17      PT LONG TERM GOAL #3   Title  Patient will increase six minute walk test distance to >1000 for progression to community ambulator and improve gait ability    Baseline  530 ft    Time  8    Period  Weeks    Status  Partially Met    Target Date  09/25/17      PT LONG TERM GOAL #4   Title  Patient will increase 10 meter walk test to >1.67ms as to improve gait speed for better community ambulation and to reduce fall risk.    Baseline  .80 m/sec    Time  8    Period  Weeks    Status  New    Target Date  09/25/17            Plan - 09/13/17 1120    Clinical Impression Statement  Patient demonstrates LOB with standing balance  exercises indicating decreased balancing strategies. Patient did require intermittent UE support to perform challenged base of support  exercises. Patient will benefit from further skilled therapy to return to prior level of function. .  Pt was encouraged to perform HEP during the week in order to continue progressing balance and strength interventions.  Pt would continue to benefit from skilled therapy services in order to further address LE strength deficits and balance deficits in order to decrease fall risk and improve mobility.    Rehab Potential  Good    PT Frequency  2x / week    PT Duration  8 weeks    PT Treatment/Interventions  Patient/family education;Therapeutic activities;Therapeutic exercise;Functional mobility training;Balance training;Neuromuscular re-education    PT Next Visit Plan  strengthening closed chain    Consulted and Agree with Plan of Care  Patient       Patient will benefit from skilled therapeutic intervention in order to improve the following deficits and impairments:  Abnormal gait, Decreased balance, Decreased endurance, Decreased mobility, Difficulty walking, Obesity, Decreased strength  Visit Diagnosis: Difficulty in walking, not elsewhere classified  Muscle weakness (generalized)  Abnormality of gait     Problem List Patient Active Problem List   Diagnosis Date Noted  . Elevated alkaline phosphatase level 05/20/2017  . Goals of  care, counseling/discussion 05/14/2017  . Chronic renal disease, stage III ([place]) 07/08/2015  . Iron deficiency anemia 07/08/2015  . Acute renal failure superimposed on stage 3 chronic kidney disease ([place]) 10/22/2014  . History of breast cancer 10/22/2014  . Elevated troponin 10/22/2014  . Pulmonary embolus ([place])   . Sepsis ([place]) 10/18/2014  . Pulmonary emboli ([place]) 10/18/2014  . Pulmonary embolism ([place]) 10/18/2014  . Candidiasis of breast 07/06/2014  . Malignant neoplasm of right female breast ([place]) 07/24/2012     Alanson Puls, PT DPT 09/13/2017, 11:22 AM  [place] MAIN [facility] SERVICES [address] [place], [place], [address] Phone: [phone]   Fax:  [phone]  Name: Nicole Schmidt MRN: [id number] Date of Birth: 11/03/1965

## 2017-09-18 ENCOUNTER — Ambulatory Visit: Payer: BLUE CROSS/BLUE SHIELD | Admitting: Physical Therapy

## 2017-09-18 ENCOUNTER — Encounter: Payer: Self-pay | Admitting: Physical Therapy

## 2017-09-18 DIAGNOSIS — R262 Difficulty in walking, not elsewhere classified: Secondary | ICD-10-CM

## 2017-09-18 DIAGNOSIS — M6281 Muscle weakness (generalized): Secondary | ICD-10-CM

## 2017-09-18 DIAGNOSIS — R269 Unspecified abnormalities of gait and mobility: Secondary | ICD-10-CM

## 2017-09-18 NOTE — Therapy (Signed)
[place] MAIN [facility] SERVICES [address] [place], [place], [address] Phone: [phone]   Fax:  [phone]  Physical Therapy Treatment  Patient Details  Name: Nicole Schmidt MRN: [id number] Date of Birth: Sep 17, 1965 Referring Provider: Anabel Bene   Encounter Date: 09/18/2017  PT End of Session - 09/18/17 1312    Visit Number  13    Number of Visits  17    Date for PT Re-Evaluation  09/25/17    PT Start Time  0103    PT Stop Time  0145    PT Time Calculation (min)  42 min    Equipment Utilized During Treatment  Gait belt    Activity Tolerance  Patient tolerated treatment well    Behavior During Therapy  [facility] for tasks assessed/performed       Past Medical History:  Diagnosis Date  . Anemia   . Arthritis   . Breast cancer of upper-inner quadrant of right female breast ([place])    Right breast invasive CA and DCIS , T1,N0,M0. Er/PR pos, her 2 negative  . Diabetes mellitus without complication ([place])   . Gout   . Gout   . Hypertension   . Menopause    age 52  . Pulmonary embolism ([place]) 10/2014  . Renal insufficiency   . Thyroid goiter     Past Surgical History:  Procedure Laterality Date  . BREAST BIOPSY Right 2014   breast ca  . BREAST EXCISIONAL BIOPSY Right 2014   breast ca  . BREAST SURGERY Right 2014   with sentinel node bx subareaolar duct excision  . COLONOSCOPY WITH PROPOFOL N/A 01/30/2017   Procedure: COLONOSCOPY WITH PROPOFOL;  Surgeon: Christene Lye, MD;  Location: [facility] ENDOSCOPY;  Service: Endoscopy;  Laterality: N/A;    There were no vitals filed for this visit.  Subjective Assessment - 09/18/17 1310    Subjective  Patient is doing well, she is doing some of her  HEP, she says that she is using a loftstrand crutch more.     Pertinent History  Patient receintly is not working due to not being able to walk without AD. according to MD note--Patient states that she has been experiencing dizziness while  in open spaces for the last 4 years but in last couple of weeks her symptoms have increased. She states that if she is around objects that she can hold on to she is ok, but if she has nothing to hold on to she will be dizzy. Patient states that her symptoms will last a few seconds that make her feel dizzy and off balance and she states that it lasts long enough to make her scared. She has seen by ENT Dr. Tami Ribas, he completed tilt table testing and she states she was told that her test was normal. Dr. Tami Ribas has ordered for patient to start physical therapy next week. Her PCP checked her carotid arteries and came up negative as well per patient. Patient was prescribed buspirone 10 mg every 12 hours. Patient states that both her PCP and Dr. Tami Ribas feel that patient could be having panic attacks. Patient does not feel that she is having panic attacks. She was also prescribed xanax by her PCP yesterday but has not yet picked up medication and does not know if she needs to take this medication. Dr. Ronnald Collum her PCP has also ordered a psychiatry and she is scheduled to see her on May 20th. Patient is noted to be ambulating  with assistance from walker. Patient states that walking with walker makes her feel more secure. She denies any recent falls. States she is fine walking and with dizziness if objects are around her. States that if someone is walking with her and she is able to touch them she will be ok. Dizziness doesn't cause her to fall. Per patient it is very short lasting. States she feels. Dizzy, off balance, no nausea. States she has been told she is having a panic attack. States in the last few weeks it has gotten worse. Patient states no problems while she is getting down. Only when she is open spaces and not around objects. No fluttering feeling or racing heart in chest. Doesn't "take her breath away". No weakness, numbness, tingling, or vision changes. Patient states she is drinking water. No decreased  mobility in neck. No dizziness when changing position.    Limitations  Walking    Patient Stated Goals  She wants to walk without her RW and be able to walk in open spaces.    Currently in Pain?  No/denies    Multiple Pain Sites  No        TREATMENT: Warm up on Nustep BUE/BLE level 2 x5 minwith cues to increase steps per minute >50 for cardiovascular challenge;  Instructed patient in balance exercise: Standing on airex: Modified tandem with head turns s x15 with2rail assist  Feet together  with head turns s x15 with2rail assist Feet together  with trunk rotation s x15 with2rail assist  Feet together with 2 lb cane press outs  x15 with2rail assist  Alternatetoe taps on 4inch stepx10 with1-0rail assist- required mod VCs and visual cues for correct technique/sequencing.Patient had significant difficulty letting go of one rail often reaching out with both hands for rail support;  Patient required min verbal/tactile cues for correct exercise techniqueincluding cues to improve posture, improve foot position, and reduce rail assist for better balance challenge;  Standing on airex: Modified tandem stance: BUE trunk rotation from midline  x5 reps each direction with CGA for safety and cues to keep both hands on ball for better trunk rotation and balance challenge;Patient would start to loose balance and then would grab for the rail; Instructed patient to improve upper trunk control for less loss of balance;  Standing on 1/2 bolster: Balance  x10 reps with B rail assist with cues to keep knee straight for better ankle strategies and weight shift; Feet in neutral, BUE wand flexion x10 reps with mod A and cues to improve weight shift for better stance control; Patient often leans forward having difficulty with posterior weight shift Gait training with loftstrand crutch 400 feet and getting far away from the wall for improved independence.  Supine hip abd/add x 15 x 2  BLE  Supine SLR with intervals  x 10 x 2 BLE  hooklying hip abd/ER x 20 x 2  Hooklying marching with 3 lbs x 20   Bridging x 10 x 2  Clam sidelying x 15   Patient needs cues for correct technique and form, no reports of pain.                         PT Education - 09/18/17 1311    Education Details  HEP    Person(s) Educated  Patient    Methods  Explanation;Demonstration    Comprehension  Verbalized understanding;Returned demonstration;Verbal cues required       PT Short Term Goals - 07/31/17  1731      PT SHORT TERM GOAL #1   Title  Patient will increase BLE gross strength to 4+/5 as to improve functional strength for independent gait, increased standing tolerance and increased ADL ability.    Time  4    Period  Weeks    Status  New    Target Date  08/28/17        PT Long Term Goals - 08/30/17 1115      PT LONG TERM GOAL #1   Title  Patient will be independent in home exercise program to improve strength/mobility for better functional independence with ADLs.    Time  8    Period  Weeks    Status  Partially Met    Target Date  09/25/17      PT LONG TERM GOAL #2   Title   Patient (< 36 years old) will complete five times sit to stand test in < 10 seconds indicating an increased LE strength and improved balance.    Baseline  14.10 sec    Time  8    Period  Weeks    Status  Partially Met    Target Date  09/25/17      PT LONG TERM GOAL #3   Title  Patient will increase six minute walk test distance to >1000 for progression to community ambulator and improve gait ability    Baseline  530 ft    Time  8    Period  Weeks    Status  Partially Met    Target Date  09/25/17      PT LONG TERM GOAL #4   Title  Patient will increase 10 meter walk test to >1.77ms as to improve gait speed for better community ambulation and to reduce fall risk.    Baseline  .80 m/sec    Time  8    Period  Weeks    Status  New    Target Date  09/25/17             Plan - 09/18/17 1312    Clinical Impression Statement  Patient instructed in gait training on level surfaces without Ad with rest periods standing. Patient is able to perform intermediate LE exercises in supine and standing open and closed chain, with no pain behaviors and resting throughout session. Patient performs dynamic standing balance training with uneven surfaces with CGA. Patient will continue to benefit from skilled PT to improve strength, balance and gait. Patient continues to have LE weakness and decreased static and dynamic standing balance    Rehab Potential  Good    PT Frequency  2x / week    PT Duration  8 weeks    PT Treatment/Interventions  Patient/family education;Therapeutic activities;Therapeutic exercise;Functional mobility training;Balance training;Neuromuscular re-education    PT Next Visit Plan  strengthening closed chain    Consulted and Agree with Plan of Care  Patient       Patient will benefit from skilled therapeutic intervention in order to improve the following deficits and impairments:  Abnormal gait, Decreased balance, Decreased endurance, Decreased mobility, Difficulty walking, Obesity, Decreased strength  Visit Diagnosis: Difficulty in walking, not elsewhere classified  Muscle weakness (generalized)  Abnormality of gait     Problem List Patient Active Problem List   Diagnosis Date Noted  . Elevated alkaline phosphatase level 05/20/2017  . Goals of care, counseling/discussion 05/14/2017  . Chronic renal disease, stage III ([place] 07/08/2015  . Iron deficiency anemia 07/08/2015  .  Acute renal failure superimposed on stage 3 chronic kidney disease ([place]) 10/22/2014  . History of breast cancer 10/22/2014  . Elevated troponin 10/22/2014  . Pulmonary embolus ([place])   . Sepsis ([place]) 10/18/2014  . Pulmonary emboli ([place]) 10/18/2014  . Pulmonary embolism ([place]) 10/18/2014  . Candidiasis of breast 07/06/2014  . Malignant neoplasm of right  female breast ([place]) 07/24/2012    Alanson Puls, PT DPT 09/18/2017, 1:16 PM  [place] MAIN [facility] SERVICES [address] [place], [place], [address] Phone: [phone]   Fax:  [phone]  Name: Nicole Schmidt MRN: [id number] Date of Birth: [birth year]

## 2017-09-19 ENCOUNTER — Other Ambulatory Visit: Payer: Self-pay

## 2017-09-19 DIAGNOSIS — Z170 Estrogen receptor positive status [ER+]: Secondary | ICD-10-CM

## 2017-09-19 DIAGNOSIS — C50111 Malignant neoplasm of central portion of right female breast: Secondary | ICD-10-CM

## 2017-09-20 ENCOUNTER — Encounter: Payer: Self-pay | Admitting: Physical Therapy

## 2017-09-20 ENCOUNTER — Ambulatory Visit: Payer: BLUE CROSS/BLUE SHIELD | Admitting: Physical Therapy

## 2017-09-20 DIAGNOSIS — M6281 Muscle weakness (generalized): Secondary | ICD-10-CM

## 2017-09-20 DIAGNOSIS — R262 Difficulty in walking, not elsewhere classified: Secondary | ICD-10-CM | POA: Diagnosis not present

## 2017-09-20 DIAGNOSIS — R269 Unspecified abnormalities of gait and mobility: Secondary | ICD-10-CM

## 2017-09-20 NOTE — Therapy (Signed)
[place] MAIN [facility] SERVICES [address] [place], [place], [address] Phone: [phone]   Fax:  [phone]  Physical Therapy Treatment  Patient Details  Name: Nicole Schmidt MRN: [id number] Date of Birth: December 08, 1965 Referring Provider: Anabel Bene   Encounter Date: 09/20/2017  PT End of Session - 09/20/17 1517    Visit Number  14    Number of Visits  17    Date for PT Re-Evaluation  09/25/17    PT Start Time  6948    PT Stop Time  1225    PT Time Calculation (min)  40 min    Equipment Utilized During Treatment  Gait belt    Activity Tolerance  Patient tolerated treatment well    Behavior During Therapy  [facility] for tasks assessed/performed       Past Medical History:  Diagnosis Date  . Anemia   . Arthritis   . Breast cancer of upper-inner quadrant of right female breast ([place])    Right breast invasive CA and DCIS , T1,N0,M0. Er/PR pos, her 2 negative  . Diabetes mellitus without complication ([place])   . Gout   . Gout   . Hypertension   . Menopause    age 52  . Pulmonary embolism ([place]) 10/2014  . Renal insufficiency   . Thyroid goiter     Past Surgical History:  Procedure Laterality Date  . BREAST BIOPSY Right 2014   breast ca  . BREAST EXCISIONAL BIOPSY Right 2014   breast ca  . BREAST SURGERY Right 2014   with sentinel node bx subareaolar duct excision  . COLONOSCOPY WITH PROPOFOL N/A 01/30/2017   Procedure: COLONOSCOPY WITH PROPOFOL;  Surgeon: Christene Lye, MD;  Location: [facility] ENDOSCOPY;  Service: Endoscopy;  Laterality: N/A;    There were no vitals filed for this visit.  Subjective Assessment - 09/20/17 1514    Subjective  Patient reports that she is able to walk short distances while holding her loftstrand crutch now.     Pertinent History  Patient receintly is not working due to not being able to walk without AD. according to MD note--Patient states that she has been experiencing dizziness while in open  spaces for the last 4 years but in last couple of weeks her symptoms have increased. She states that if she is around objects that she can hold on to she is ok, but if she has nothing to hold on to she will be dizzy. Patient states that her symptoms will last a few seconds that make her feel dizzy and off balance and she states that it lasts long enough to make her scared. She has seen by ENT Dr. Tami Ribas, he completed tilt table testing and she states she was told that her test was normal. Dr. Tami Ribas has ordered for patient to start physical therapy next week. Her PCP checked her carotid arteries and came up negative as well per patient. Patient was prescribed buspirone 10 mg every 12 hours. Patient states that both her PCP and Dr. Tami Ribas feel that patient could be having panic attacks. Patient does not feel that she is having panic attacks. She was also prescribed xanax by her PCP yesterday but has not yet picked up medication and does not know if she needs to take this medication. Dr. Ronnald Collum her PCP has also ordered a psychiatry and she is scheduled to see her on May 20th. Patient is noted to be ambulating with assistance from walker. Patient states  that walking with walker makes her feel more secure. She denies any recent falls. States she is fine walking and with dizziness if objects are around her. States that if someone is walking with her and she is able to touch them she will be ok. Dizziness doesn't cause her to fall. Per patient it is very short lasting. States she feels. Dizzy, off balance, no nausea. States she has been told she is having a panic attack. States in the last few weeks it has gotten worse. Patient states no problems while she is getting down. Only when she is open spaces and not around objects. No fluttering feeling or racing heart in chest. Doesn't "take her breath away". No weakness, numbness, tingling, or vision changes. Patient states she is drinking water. No decreased mobility in  neck. No dizziness when changing position.    Limitations  Walking    Patient Stated Goals  She wants to walk without her RW and be able to walk in open spaces.    Currently in Pain?  No/denies    Multiple Pain Sites  No       Treatment: Neuromuscular training: Standing on foam feet apart, feet together, modified tandem with head turns, and trunk rotation with ball and UE in extension x 2 mins each position  Gait training : Ambulating without AD and holding loftstrand AD 30 feet x 10 reps and SBA  Patient is able to ambulate in open area while holding the AD and SBA.                         PT Education - 09/20/17 1516    Education Details  HEP    Person(s) Educated  Patient    Methods  Explanation    Comprehension  Verbalized understanding       PT Short Term Goals - 07/31/17 1731      PT SHORT TERM GOAL #1   Title  Patient will increase BLE gross strength to 4+/5 as to improve functional strength for independent gait, increased standing tolerance and increased ADL ability.    Time  4    Period  Weeks    Status  New    Target Date  08/28/17        PT Long Term Goals - 08/30/17 1115      PT LONG TERM GOAL #1   Title  Patient will be independent in home exercise program to improve strength/mobility for better functional independence with ADLs.    Time  8    Period  Weeks    Status  Partially Met    Target Date  09/25/17      PT LONG TERM GOAL #2   Title   Patient (< 81 years old) will complete five times sit to stand test in < 10 seconds indicating an increased LE strength and improved balance.    Baseline  14.10 sec    Time  8    Period  Weeks    Status  Partially Met    Target Date  09/25/17      PT LONG TERM GOAL #3   Title  Patient will increase six minute walk test distance to >1000 for progression to community ambulator and improve gait ability    Baseline  530 ft    Time  8    Period  Weeks    Status  Partially Met    Target  Date  09/25/17  PT LONG TERM GOAL #4   Title  Patient will increase 10 meter walk test to >1.21ms as to improve gait speed for better community ambulation and to reduce fall risk.    Baseline  .80 m/sec    Time  8    Period  Weeks    Status  New    Target Date  09/25/17            Plan - 09/20/17 1518    Clinical Impression Statement  Instructed patient in gait training in open areas while holding loftstrand AD. Patient performed static and dynamic standing balance with head turns and trunk rotation . Patient continues to have decreased dynamic standing balance and difficulty with gait in open areas without AD but is able to ambulate today 30 feet in an open area. Patient will benefit from skilled Pt to improve mobility and gait.     Rehab Potential  Good    PT Frequency  2x / week    PT Duration  8 weeks    PT Treatment/Interventions  Patient/family education;Therapeutic activities;Therapeutic exercise;Functional mobility training;Balance training;Neuromuscular re-education    PT Next Visit Plan  strengthening closed chain    Consulted and Agree with Plan of Care  Patient       Patient will benefit from skilled therapeutic intervention in order to improve the following deficits and impairments:  Abnormal gait, Decreased balance, Decreased endurance, Decreased mobility, Difficulty walking, Obesity, Decreased strength  Visit Diagnosis: Difficulty in walking, not elsewhere classified  Muscle weakness (generalized)  Abnormality of gait     Problem List Patient Active Problem List   Diagnosis Date Noted  . Elevated alkaline phosphatase level 05/20/2017  . Goals of care, counseling/discussion 05/14/2017  . Chronic renal disease, stage III ([place] 07/08/2015  . Iron deficiency anemia 07/08/2015  . Acute renal failure superimposed on stage 3 chronic kidney disease ([place] 10/22/2014  . History of breast cancer 10/22/2014  . Elevated troponin 10/22/2014  . Pulmonary embolus  ([address]   . Sepsis ([place] 10/18/2014  . Pulmonary emboli ([place] 10/18/2014  . Pulmonary embolism ([place] 10/18/2014  . Candidiasis of breast 07/06/2014  . Malignant neoplasm of right female breast ([place] 07/24/2012    MAlanson Puls PT DPT 09/20/2017, 3:24 PM  [place] [facility] ServicesSERVICES [address]RP[address] [place] [address]Phone: [phone]  Fax:  [phone] Name: Nicole CONSTANTINEMRN: [id number] of Birth: [birth year]

## 2017-09-25 ENCOUNTER — Encounter: Payer: Self-pay | Admitting: Physical Therapy

## 2017-09-25 ENCOUNTER — Ambulatory Visit: Payer: BLUE CROSS/BLUE SHIELD | Admitting: Physical Therapy

## 2017-09-25 DIAGNOSIS — M6281 Muscle weakness (generalized): Secondary | ICD-10-CM

## 2017-09-25 DIAGNOSIS — R262 Difficulty in walking, not elsewhere classified: Secondary | ICD-10-CM | POA: Diagnosis not present

## 2017-09-25 DIAGNOSIS — R269 Unspecified abnormalities of gait and mobility: Secondary | ICD-10-CM

## 2017-09-25 NOTE — Therapy (Signed)
[place] MAIN [facility] SERVICES [address] [place], [place], [address] Phone: [phone]   Fax:  [phone]  Physical Therapy Treatment  Patient Details  Name: Nicole Schmidt MRN: [id number] Date of Birth: 30-Oct-1965 Referring Provider: Anabel Bene   Encounter Date: 09/25/2017  PT End of Session - 09/25/17 1114    Visit Number  17    Number of Visits  17    Date for PT Re-Evaluation  09/25/17    PT Start Time  1105    PT Stop Time  1145    PT Time Calculation (min)  40 min    Equipment Utilized During Treatment  Gait belt    Activity Tolerance  Patient tolerated treatment well    Behavior During Therapy  [facility] for tasks assessed/performed       Past Medical History:  Diagnosis Date  . Anemia   . Arthritis   . Breast cancer of upper-inner quadrant of right female breast ([place])    Right breast invasive CA and DCIS , T1,N0,M0. Er/PR pos, her 2 negative  . Diabetes mellitus without complication ([place])   . Gout   . Gout   . Hypertension   . Menopause    age 14  . Pulmonary embolism ([place]) 10/2014  . Renal insufficiency   . Thyroid goiter     Past Surgical History:  Procedure Laterality Date  . BREAST BIOPSY Right 2014   breast ca  . BREAST EXCISIONAL BIOPSY Right 2014   breast ca  . BREAST SURGERY Right 2014   with sentinel node bx subareaolar duct excision  . COLONOSCOPY WITH PROPOFOL N/A 01/30/2017   Procedure: COLONOSCOPY WITH PROPOFOL;  Surgeon: Christene Lye, MD;  Location: [facility] ENDOSCOPY;  Service: Endoscopy;  Laterality: N/A;    There were no vitals filed for this visit.  Subjective Assessment - 09/25/17 1112    Subjective  Patient reports that she is able to walk short distances while holding her spc now.     Pertinent History  Patient receintly is not working due to not being able to walk without AD. according to MD note--Patient states that she has been experiencing dizziness while in open spaces for  the last 4 years but in last couple of weeks her symptoms have increased. She states that if she is around objects that she can hold on to she is ok, but if she has nothing to hold on to she will be dizzy. Patient states that her symptoms will last a few seconds that make her feel dizzy and off balance and she states that it lasts long enough to make her scared. She has seen by ENT Dr. Tami Ribas, he completed tilt table testing and she states she was told that her test was normal. Dr. Tami Ribas has ordered for patient to start physical therapy next week. Her PCP checked her carotid arteries and came up negative as well per patient. Patient was prescribed buspirone 10 mg every 12 hours. Patient states that both her PCP and Dr. Tami Ribas feel that patient could be having panic attacks. Patient does not feel that she is having panic attacks. She was also prescribed xanax by her PCP yesterday but has not yet picked up medication and does not know if she needs to take this medication. Dr. Ronnald Collum her PCP has also ordered a psychiatry and she is scheduled to see her on May 20th. Patient is noted to be ambulating with assistance from walker. Patient states that  walking with walker makes her feel more secure. She denies any recent falls. States she is fine walking and with dizziness if objects are around her. States that if someone is walking with her and she is able to touch them she will be ok. Dizziness doesn't cause her to fall. Per patient it is very short lasting. States she feels. Dizzy, off balance, no nausea. States she has been told she is having a panic attack. States in the last few weeks it has gotten worse. Patient states no problems while she is getting down. Only when she is open spaces and not around objects. No fluttering feeling or racing heart in chest. Doesn't "take her breath away". No weakness, numbness, tingling, or vision changes. Patient states she is drinking water. No decreased mobility in neck. No  dizziness when changing position.    Limitations  Walking    Patient Stated Goals  She wants to walk without her RW and be able to walk in open spaces.    Currently in Pain?  No/denies    Multiple Pain Sites  No       Treatment: Neuromuscular training: Standing on foam feet apart, feet together, modified tandem with head turns, and trunk rotation with ball and UE in extension , with trunk rotation with 2 lb rod verticalx 2 mins each position Standing on 1/2 foam flat side down with head turnsl x 2 mins  Standing on purple and tapping yellow disk x 10 x 2  Standing on flat surface with head turns and trunk rotation x 2 mins Leg press 120 lbs 20 x 3  Gait training : Ambulating without AD and holding spc  AD 200 feet x 2 reps and SBA  Patient is able to ambulate in open area while holding the AD and SBA.  Patient needs frequent rest periods with balance exercises                       PT Education - 09/25/17 1113    Education Details  HEP    Person(s) Educated  Patient    Methods  Explanation    Comprehension  Verbalized understanding       PT Short Term Goals - 07/31/17 1731      PT SHORT TERM GOAL #1   Title  Patient will increase BLE gross strength to 4+/5 as to improve functional strength for independent gait, increased standing tolerance and increased ADL ability.    Time  4    Period  Weeks    Status  New    Target Date  08/28/17        PT Long Term Goals - 08/30/17 1115      PT LONG TERM GOAL #1   Title  Patient will be independent in home exercise program to improve strength/mobility for better functional independence with ADLs.    Time  8    Period  Weeks    Status  Partially Met    Target Date  09/25/17      PT LONG TERM GOAL #2   Title   Patient (< 34 years old) will complete five times sit to stand test in < 10 seconds indicating an increased LE strength and improved balance.    Baseline  14.10 sec    Time  8    Period  Weeks     Status  Partially Met    Target Date  09/25/17      PT LONG  TERM GOAL #3   Title  Patient will increase six minute walk test distance to >1000 for progression to community ambulator and improve gait ability    Baseline  530 ft    Time  8    Period  Weeks    Status  Partially Met    Target Date  09/25/17      PT LONG TERM GOAL #4   Title  Patient will increase 10 meter walk test to >1.55ms as to improve gait speed for better community ambulation and to reduce fall risk.    Baseline  .80 m/sec    Time  8    Period  Weeks    Status  New    Target Date  09/25/17            Plan - 09/25/17 1115    Clinical Impression Statement  Patient instructed in gait training on level surfaces without Ad with rest periods standing. Patient is able to perform intermediate LE exercises in supine and standing open and closed chain, with no pain behaviors and resting throughout session. Patient performs dynamic standing balance training with uneven surfaces with CGA. Patient will continue to benefit from skilled PT to improve strength, balance and gait. Patient continues to have LE weakness and decreased static and dynamic standing balance    Rehab Potential  Good    PT Frequency  2x / week    PT Duration  8 weeks    PT Treatment/Interventions  Patient/family education;Therapeutic activities;Therapeutic exercise;Functional mobility training;Balance training;Neuromuscular re-education    PT Next Visit Plan  strengthening closed chain    Consulted and Agree with Plan of Care  Patient       Patient will benefit from skilled therapeutic intervention in order to improve the following deficits and impairments:  Abnormal gait, Decreased balance, Decreased endurance, Decreased mobility, Difficulty walking, Obesity, Decreased strength  Visit Diagnosis: Difficulty in walking, not elsewhere classified  Muscle weakness (generalized)  Abnormality of gait     Problem List Patient Active Problem List    Diagnosis Date Noted  . Elevated alkaline phosphatase level 05/20/2017  . Goals of care, counseling/discussion 05/14/2017  . Chronic renal disease, stage III ([place] 07/08/2015  . Iron deficiency anemia 07/08/2015  . Acute renal failure superimposed on stage 3 chronic kidney disease ([place] 10/22/2014  . History of breast cancer 10/22/2014  . Elevated troponin 10/22/2014  . Pulmonary embolus ([place]   . Sepsis ([place] 10/18/2014  . Pulmonary emboli ([place] 10/18/2014  . Pulmonary embolism ([place] 10/18/2014  . Candidiasis of breast 07/06/2014  . Malignant neoplasm of right female breast ([place] 07/24/2012    MAlanson Puls PT DPT 09/25/2017, 11:23 AM  [place] [facility] [address] [address]Phone: [phone]  Fax:  [phone] Name: Nicole MISSEYMRN: [id number] of Birth: 7Feb 05, 1967

## 2017-09-27 ENCOUNTER — Ambulatory Visit: Payer: BLUE CROSS/BLUE SHIELD | Admitting: Physical Therapy

## 2017-09-27 ENCOUNTER — Encounter: Payer: Self-pay | Admitting: Physical Therapy

## 2017-09-27 DIAGNOSIS — R262 Difficulty in walking, not elsewhere classified: Secondary | ICD-10-CM

## 2017-09-27 DIAGNOSIS — M6281 Muscle weakness (generalized): Secondary | ICD-10-CM

## 2017-09-27 DIAGNOSIS — R269 Unspecified abnormalities of gait and mobility: Secondary | ICD-10-CM

## 2017-09-27 NOTE — Therapy (Signed)
[place] MAIN [facility] SERVICES [address] [place], [place], [address] Phone: [phone]   Fax:  [phone]  Physical Therapy Treatment/ Progress Note  Patient Details  Name: Nicole Schmidt MRN: [id number] Date of Birth: 05-Mar-1965 Referring Provider: Anabel Bene   Encounter Date: 09/27/2017  PT End of Session - 09/27/17 1105    Visit Number  18    Number of Visits  33    Date for PT Re-Evaluation  10/23/17    PT Start Time  1100    PT Stop Time  1145    PT Time Calculation (min)  45 min    Equipment Utilized During Treatment  Gait belt    Activity Tolerance  Patient tolerated treatment well    Behavior During Therapy  [facility] for tasks assessed/performed       Past Medical History:  Diagnosis Date  . Anemia   . Arthritis   . Breast cancer of upper-inner quadrant of right female breast ([place])    Right breast invasive CA and DCIS , T1,N0,M0. Er/PR pos, her 2 negative  . Diabetes mellitus without complication ([place])   . Gout   . Gout   . Hypertension   . Menopause    age 27  . Pulmonary embolism ([place]) 10/2014  . Renal insufficiency   . Thyroid goiter     Past Surgical History:  Procedure Laterality Date  . BREAST BIOPSY Right 2014   breast ca  . BREAST EXCISIONAL BIOPSY Right 2014   breast ca  . BREAST SURGERY Right 2014   with sentinel node bx subareaolar duct excision  . COLONOSCOPY WITH PROPOFOL N/A 01/30/2017   Procedure: COLONOSCOPY WITH PROPOFOL;  Surgeon: Christene Lye, MD;  Location: [facility] ENDOSCOPY;  Service: Endoscopy;  Laterality: N/A;    There were no vitals filed for this visit.  Subjective Assessment - 09/27/17 1103    Subjective  Patient reports that she is able to walk short distances while holding her spc now.     Pertinent History  Patient receintly is not working due to not being able to walk without AD. according to MD note--Patient states that she has been experiencing dizziness while in  open spaces for the last 4 years but in last couple of weeks her symptoms have increased. She states that if she is around objects that she can hold on to she is ok, but if she has nothing to hold on to she will be dizzy. Patient states that her symptoms will last a few seconds that make her feel dizzy and off balance and she states that it lasts long enough to make her scared. She has seen by ENT Dr. Tami Ribas, he completed tilt table testing and she states she was told that her test was normal. Dr. Tami Ribas has ordered for patient to start physical therapy next week. Her PCP checked her carotid arteries and came up negative as well per patient. Patient was prescribed buspirone 10 mg every 12 hours. Patient states that both her PCP and Dr. Tami Ribas feel that patient could be having panic attacks. Patient does not feel that she is having panic attacks. She was also prescribed xanax by her PCP yesterday but has not yet picked up medication and does not know if she needs to take this medication. Dr. Ronnald Collum her PCP has also ordered a psychiatry and she is scheduled to see her on May 20th. Patient is noted to be ambulating with assistance from walker. Patient  states that walking with walker makes her feel more secure. She denies any recent falls. States she is fine walking and with dizziness if objects are around her. States that if someone is walking with her and she is able to touch them she will be ok. Dizziness doesn't cause her to fall. Per patient it is very short lasting. States she feels. Dizzy, off balance, no nausea. States she has been told she is having a panic attack. States in the last few weeks it has gotten worse. Patient states no problems while she is getting down. Only when she is open spaces and not around objects. No fluttering feeling or racing heart in chest. Doesn't "take her breath away". No weakness, numbness, tingling, or vision changes. Patient states she is drinking water. No decreased  mobility in neck. No dizziness when changing position.    Limitations  Walking    Patient Stated Goals  She wants to walk without her RW and be able to walk in open spaces.    Currently in Pain?  No/denies    Multiple Pain Sites  No       Treatment: Neuromuscular training: Stepping fwd/ bwd over 1/2 foam x 10  Standing on foam feet apart, feet together, modified tandem with head turns, and trunk rotation with ball and UE in extension , with trunk rotation with 2 lb rod verticalx 2 mins each position Standing on 1/2 foam flat side down with head turnsl x 2 mins  Standing on purple and tapping yellow disk x 10 x 2  Standing on flat surface with head turns and trunk rotation x 2 mins   Therapeutic activities : 10 MW , 5 x sit to stand, 6 MW outcome measures performed with progress made towards all goals.   Patient is able to ambulate in open area while holding the AD and SBA.  Patient needs frequent rest periods with balance exercises                        PT Education - 09/27/17 1105    Education Details  HEP    Person(s) Educated  Patient    Methods  Explanation    Comprehension  Verbalized understanding       PT Short Term Goals - 07/31/17 1731      PT SHORT TERM GOAL #1   Title  Patient will increase BLE gross strength to 4+/5 as to improve functional strength for independent gait, increased standing tolerance and increased ADL ability.    Time  4    Period  Weeks    Status  New    Target Date  08/28/17        PT Long Term Goals - 09/27/17 1106      PT LONG TERM GOAL #1   Title  Patient will be independent in home exercise program to improve strength/mobility for better functional independence with ADLs.    Time  8    Period  Weeks    Status  Achieved    Target Date  09/25/17      PT LONG TERM GOAL #2   Title   Patient (< 79 years old) will complete five times sit to stand test in < 10 seconds indicating an increased LE strength and  improved balance.    Baseline  14.10 sec: 09/27/17 ; 13.50 sec     Time  8    Period  Weeks    Status  Partially  Met    Target Date  10/25/17      PT LONG TERM GOAL #3   Title  Patient will increase six minute walk test distance to >1000 for progression to community ambulator and improve gait ability    Baseline  530 ft : 09/27/17 650 feet    Time  8    Period  Weeks    Status  Partially Met    Target Date  10/25/17      PT LONG TERM GOAL #4   Title  Patient will increase 10 meter walk test to >1.22ms as to improve gait speed for better community ambulation and to reduce fall risk.    Baseline  .80 m/sec : 09/27/17 , .91 sec    Time  8    Period  Weeks    Status  Partially Met    Target Date  10/25/17            Plan - 09/27/17 1127    Clinical Impression Statement Patient's condition has the potential to improve in response to therapy. Maximum improvement is yet to be obtained. The anticipated improvement is attainable and reasonable in a generally predictable time. Start date of reporting period 08/30/17 end date of reporting period 09/27/17 . Patient reports being able to ambulate better without AD.  Pt was able to progress dynamic balance exercises today, noting improved postural reactions with LOB and moving outside normal BOS.  Pt was able to perform all exercises on uneven surfaces with minimal LOB noted.  Dynamic balance stability activities were progressed today, with decrease control noted when attempting to perform tasks with single leg activities.  Pt would continue to benefit from skilled therapy services to address further balance impairments and decrease falls risk.    Rehab Potential  Good    PT Frequency  2x / week    PT Duration  8 weeks    PT Treatment/Interventions  Patient/family education;Therapeutic activities;Therapeutic exercise;Functional mobility training;Balance training;Neuromuscular re-education    PT Next Visit Plan  strengthening closed chain     Consulted and Agree with Plan of Care  Patient       Patient will benefit from skilled therapeutic intervention in order to improve the following deficits and impairments:  Abnormal gait, Decreased balance, Decreased endurance, Decreased mobility, Difficulty walking, Obesity, Decreased strength  Visit Diagnosis: Difficulty in walking, not elsewhere classified  Muscle weakness (generalized)  Abnormality of gait     Problem List Patient Active Problem List   Diagnosis Date Noted  . Elevated alkaline phosphatase level 05/20/2017  . Goals of care, counseling/discussion 05/14/2017  . Chronic renal disease, stage III ([place] 07/08/2015  . Iron deficiency anemia 07/08/2015  . Acute renal failure superimposed on stage 3 chronic kidney disease ([place] 10/22/2014  . History of breast cancer 10/22/2014  . Elevated troponin 10/22/2014  . Pulmonary embolus ([place]   . Sepsis ([place] 10/18/2014  . Pulmonary emboli ([place] 10/18/2014  . Pulmonary embolism ([place] 10/18/2014  . Candidiasis of breast 07/06/2014  . Malignant neoplasm of right female breast ([place] 07/24/2012    MAlanson Puls PT DPT 09/27/2017, 11:36 AM  [place] [facility] [address] [address]Phone: [phone]  Fax:  [phone] Name: Nicole MARMOMRN: [id number] of Birth: 7May 26, 1967

## 2017-10-02 ENCOUNTER — Ambulatory Visit: Payer: BLUE CROSS/BLUE SHIELD | Admitting: Physical Therapy

## 2017-10-02 ENCOUNTER — Encounter: Payer: Self-pay | Admitting: Physical Therapy

## 2017-10-02 DIAGNOSIS — R269 Unspecified abnormalities of gait and mobility: Secondary | ICD-10-CM

## 2017-10-02 DIAGNOSIS — M6281 Muscle weakness (generalized): Secondary | ICD-10-CM

## 2017-10-02 DIAGNOSIS — R262 Difficulty in walking, not elsewhere classified: Secondary | ICD-10-CM

## 2017-10-02 NOTE — Therapy (Signed)
[place] MAIN [facility] SERVICES [address] [place], [place], [address] Phone: [phone]   Fax:  [phone]  Physical Therapy Treatment  Patient Details  Name: ANZLEE HINESLEY MRN: [id number] Date of Birth: 1965/12/14 Referring Provider: Anabel Bene   Encounter Date: 10/02/2017  PT End of Session - 10/02/17 1106    Visit Number  19    Number of Visits  33    Date for PT Re-Evaluation  10/23/17    PT Start Time  1100    PT Stop Time  1145    PT Time Calculation (min)  45 min    Equipment Utilized During Treatment  Gait belt    Activity Tolerance  Patient tolerated treatment well    Behavior During Therapy  [facility] for tasks assessed/performed       Past Medical History:  Diagnosis Date  . Anemia   . Arthritis   . Breast cancer of upper-inner quadrant of right female breast ([place])    Right breast invasive CA and DCIS , T1,N0,M0. Er/PR pos, her 2 negative  . Diabetes mellitus without complication ([place])   . Gout   . Gout   . Hypertension   . Menopause    age 52  . Pulmonary embolism ([place]) 10/2014  . Renal insufficiency   . Thyroid goiter     Past Surgical History:  Procedure Laterality Date  . BREAST BIOPSY Right 2014   breast ca  . BREAST EXCISIONAL BIOPSY Right 2014   breast ca  . BREAST SURGERY Right 2014   with sentinel node bx subareaolar duct excision  . COLONOSCOPY WITH PROPOFOL N/A 01/30/2017   Procedure: COLONOSCOPY WITH PROPOFOL;  Surgeon: Christene Lye, MD;  Location: [facility] ENDOSCOPY;  Service: Endoscopy;  Laterality: N/A;    There were no vitals filed for this visit.  Subjective Assessment - 10/02/17 1104    Subjective  Patient reports that she is able to walk short distances while holding her spc now. she walked into the hospital today without AD.    Patient is accompained by:  Family member    Pertinent History  Patient receintly is not working due to not being able to walk without AD. according to  MD note--Patient states that she has been experiencing dizziness while in open spaces for the last 4 years but in last couple of weeks her symptoms have increased. She states that if she is around objects that she can hold on to she is ok, but if she has nothing to hold on to she will be dizzy. Patient states that her symptoms will last a few seconds that make her feel dizzy and off balance and she states that it lasts long enough to make her scared. She has seen by ENT Dr. Tami Ribas, he completed tilt table testing and she states she was told that her test was normal. Dr. Tami Ribas has ordered for patient to start physical therapy next week. Her PCP checked her carotid arteries and came up negative as well per patient. Patient was prescribed buspirone 10 mg every 12 hours. Patient states that both her PCP and Dr. Tami Ribas feel that patient could be having panic attacks. Patient does not feel that she is having panic attacks. She was also prescribed xanax by her PCP yesterday but has not yet picked up medication and does not know if she needs to take this medication. Dr. Ronnald Collum her PCP has also ordered a psychiatry and she is scheduled to see  her on May 20th. Patient is noted to be ambulating with assistance from walker. Patient states that walking with walker makes her feel more secure. She denies any recent falls. States she is fine walking and with dizziness if objects are around her. States that if someone is walking with her and she is able to touch them she will be ok. Dizziness doesn't cause her to fall. Per patient it is very short lasting. States she feels. Dizzy, off balance, no nausea. States she has been told she is having a panic attack. States in the last few weeks it has gotten worse. Patient states no problems while she is getting down. Only when she is open spaces and not around objects. No fluttering feeling or racing heart in chest. Doesn't "take her breath away". No weakness, numbness, tingling, or  vision changes. Patient states she is drinking water. No decreased mobility in neck. No dizziness when changing position.    Limitations  Walking    Patient Stated Goals  She wants to walk without her RW and be able to walk in open spaces.    Currently in Pain?  No/denies    Multiple Pain Sites  No         TREATMENT: Warm up on nu-step level 2 x4 min (Unbilled):  Instructed patient in advanced balance exercise: Standing on airex: Trunk rotation side to side with ball BUE hold and head turning with trunk x 10  Catching/ tossing ball x 20 feet apart  Alternate toe taps on 4 inch step without rail assist x15 with cues to step backwards for better foot placement and avoid toe catching; Modified tandem stance with BUE ball up/down x10 each foot in front with CGA for safety;   Standing on airex beam:  Tandem stance on airex beam without rail assist 10 sec hold x4 each foot in front with CGA for safety and cues to improve upper trunk control for better balance control; Side stepping down airex beam without rail assist x3 laps each direction with cues to keep feet on beam and avoid stepping off; Standing with feet apart, BUE ball toss x10 unsupported with close supervision; Patient exhibits posterior loss of balance requiring cues for forward weight shift;   Standing on 1/2 bolster (flat side up) Feet flat, apart, BUE wand flexion x10 reps with min A For safety; Patient able to keep balance well with minimal posterior loss of balance;  stepping over hurdle x 10 fwd / bwd, 10 side to side with UE support  Rocker board fwd/ bwd x 10 , side to side x 10 with UE assist  Weights on ankles:  3 lbs : Marching in hooklying x 20 sidelying hip abd with 3 lbs x 15  sidelying in hip extension x 15  SLR x 15 BLE  Patient needs cues for technique and form                       PT Education - 10/02/17 1105    Education Details  HEP    Person(s) Educated  Patient    Methods   Explanation;Demonstration    Comprehension  Verbalized understanding;Returned demonstration       PT Short Term Goals - 07/31/17 1731      PT SHORT TERM GOAL #1   Title  Patient will increase BLE gross strength to 4+/5 as to improve functional strength for independent gait, increased standing tolerance and increased ADL ability.    Time  4    Period  Weeks    Status  New    Target Date  08/28/17        PT Long Term Goals - 09/27/17 1106      PT LONG TERM GOAL #1   Title  Patient will be independent in home exercise program to improve strength/mobility for better functional independence with ADLs.    Time  8    Period  Weeks    Status  Achieved    Target Date  09/25/17      PT LONG TERM GOAL #2   Title   Patient (< 7 years old) will complete five times sit to stand test in < 10 seconds indicating an increased LE strength and improved balance.    Baseline  14.10 sec: 09/27/17 ; 13.50 sec     Time  8    Period  Weeks    Status  Partially Met    Target Date  10/25/17      PT LONG TERM GOAL #3   Title  Patient will increase six minute walk test distance to >1000 for progression to community ambulator and improve gait ability    Baseline  530 ft : 09/27/17 650 feet    Time  8    Period  Weeks    Status  Partially Met    Target Date  10/25/17      PT LONG TERM GOAL #4   Title  Patient will increase 10 meter walk test to >1.62ms as to improve gait speed for better community ambulation and to reduce fall risk.    Baseline  .80 m/sec : 09/27/17 , .91 sec    Time  8    Period  Weeks    Status  Partially Met    Target Date  10/25/17            Plan - 10/02/17 1107    Clinical Impression Statement  Instructed patient in balance and strengthening exercise. Patient requires CGA to min A with advanced balance exercise. Patient has fear of falling without AD and without nearby walls or person.. Patient also instructed to slow down LE movement during strengthening exercise for  better motor control. Patient reports increased fatigue at end of treatment session. Patient would benefit from additional skilled PT intervention to improve balance/gait safety and reduce fall risk.    Rehab Potential  Good    PT Frequency  2x / week    PT Duration  8 weeks    PT Treatment/Interventions  Patient/family education;Therapeutic activities;Therapeutic exercise;Functional mobility training;Balance training;Neuromuscular re-education    PT Next Visit Plan  strengthening closed chain    Consulted and Agree with Plan of Care  Patient       Patient will benefit from skilled therapeutic intervention in order to improve the following deficits and impairments:  Abnormal gait, Decreased balance, Decreased endurance, Decreased mobility, Difficulty walking, Obesity, Decreased strength  Visit Diagnosis: Difficulty in walking, not elsewhere classified  Muscle weakness (generalized)  Abnormality of gait     Problem List Patient Active Problem List   Diagnosis Date Noted  . Elevated alkaline phosphatase level 05/20/2017  . Goals of care, counseling/discussion 05/14/2017  . Chronic renal disease, stage III ([place] 07/08/2015  . Iron deficiency anemia 07/08/2015  . Acute renal failure superimposed on stage 3 chronic kidney disease ([place] 10/22/2014  . History of breast cancer 10/22/2014  . Elevated troponin 10/22/2014  . Pulmonary embolus ([place]   . Sepsis ([place]  10/18/2014  . Pulmonary emboli ([place]) 10/18/2014  . Pulmonary embolism ([place]) 10/18/2014  . Candidiasis of breast 07/06/2014  . Malignant neoplasm of right female breast ([place]) 07/24/2012    Alanson Puls, PT DPT 10/02/2017, 11:14 AM  [place] MAIN [facility] SERVICES [address] [place], [place], [address] Phone: [phone]   Fax:  [phone]  Name: KALESE ENSZ MRN: [id number] Date of Birth: December 15, 1965

## 2017-10-04 ENCOUNTER — Ambulatory Visit: Payer: BLUE CROSS/BLUE SHIELD | Admitting: Physical Therapy

## 2017-10-04 ENCOUNTER — Ambulatory Visit: Payer: BLUE CROSS/BLUE SHIELD

## 2017-10-09 ENCOUNTER — Ambulatory Visit: Payer: BLUE CROSS/BLUE SHIELD | Admitting: Physical Therapy

## 2017-10-09 ENCOUNTER — Encounter: Payer: Self-pay | Admitting: Physical Therapy

## 2017-10-09 DIAGNOSIS — R269 Unspecified abnormalities of gait and mobility: Secondary | ICD-10-CM

## 2017-10-09 DIAGNOSIS — M6281 Muscle weakness (generalized): Secondary | ICD-10-CM

## 2017-10-09 DIAGNOSIS — R262 Difficulty in walking, not elsewhere classified: Secondary | ICD-10-CM | POA: Diagnosis not present

## 2017-10-09 NOTE — Therapy (Signed)
[place] MAIN [facility] SERVICES [address] [place], [place], [address] Phone: [phone]   Fax:  [phone]  Physical Therapy Treatment/ Discharge summary Patient Details  Name: Nicole Schmidt MRN: [id number] Date of Birth: [birth year] Referring Provider: Anabel Bene   Encounter Date: 10/09/2017  PT End of Session - 10/09/17 1626    Visit Number  20    Number of Visits  33    Date for PT Re-Evaluation  10/23/17    PT Start Time  0415    PT Stop Time  0500    PT Time Calculation (min)  45 min    Equipment Utilized During Treatment  Gait belt    Activity Tolerance  Patient tolerated treatment well    Behavior During Therapy  [facility] for tasks assessed/performed       Past Medical History:  Diagnosis Date  . Anemia   . Arthritis   . Breast cancer of upper-inner quadrant of right female breast ([place])    Right breast invasive CA and DCIS , T1,N0,M0. Er/PR pos, her 2 negative  . Diabetes mellitus without complication ([place])   . Gout   . Gout   . Hypertension   . Menopause    age 52  . Pulmonary embolism ([place]) 10/2014  . Renal insufficiency   . Thyroid goiter     Past Surgical History:  Procedure Laterality Date  . BREAST BIOPSY Right 2014   breast ca  . BREAST EXCISIONAL BIOPSY Right 2014   breast ca  . BREAST SURGERY Right 2014   with sentinel node bx subareaolar duct excision  . COLONOSCOPY WITH PROPOFOL N/A 01/30/2017   Procedure: COLONOSCOPY WITH PROPOFOL;  Surgeon: Christene Lye, MD;  Location: [facility] ENDOSCOPY;  Service: Endoscopy;  Laterality: N/A;    There were no vitals filed for this visit.  Subjective Assessment - 10/09/17 1626    Subjective  Patient reports that she is able to walk short distances while holding her spc now. she walked into the hospital today without AD.    Patient is accompained by:  Family member    Pertinent History  Patient receintly is not working due to not being able to walk without  AD. according to MD note--Patient states that she has been experiencing dizziness while in open spaces for the last 4 years but in last couple of weeks her symptoms have increased. She states that if she is around objects that she can hold on to she is ok, but if she has nothing to hold on to she will be dizzy. Patient states that her symptoms will last a few seconds that make her feel dizzy and off balance and she states that it lasts long enough to make her scared. She has seen by ENT Dr. Tami Ribas, he completed tilt table testing and she states she was told that her test was normal. Dr. Tami Ribas has ordered for patient to start physical therapy next week. Her PCP checked her carotid arteries and came up negative as well per patient. Patient was prescribed buspirone 10 mg every 12 hours. Patient states that both her PCP and Dr. Tami Ribas feel that patient could be having panic attacks. Patient does not feel that she is having panic attacks. She was also prescribed xanax by her PCP yesterday but has not yet picked up medication and does not know if she needs to take this medication. Dr. Ronnald Collum her PCP has also ordered a psychiatry and she is scheduled to  see her on May 20th. Patient is noted to be ambulating with assistance from walker. Patient states that walking with walker makes her feel more secure. She denies any recent falls. States she is fine walking and with dizziness if objects are around her. States that if someone is walking with her and she is able to touch them she will be ok. Dizziness doesn't cause her to fall. Per patient it is very short lasting. States she feels. Dizzy, off balance, no nausea. States she has been told she is having a panic attack. States in the last few weeks it has gotten worse. Patient states no problems while she is getting down. Only when she is open spaces and not around objects. No fluttering feeling or racing heart in chest. Doesn't "take her breath away". No weakness,  numbness, tingling, or vision changes. Patient states she is drinking water. No decreased mobility in neck. No dizziness when changing position.    Limitations  Walking    Patient Stated Goals  She wants to walk without her RW and be able to walk in open spaces.    Currently in Pain?  No/denies    Multiple Pain Sites  No        TREATMENT: Warm up on nu-step level 2 x5  min (Unbilled):  Instructed patient in advanced balance exercise: Standing on airex: Trunk rotation side to side with ball BUE hold and head turning with trunk x 10  Tapping balloon to mirror  x 2 mins  feet apart  Alternate toe taps on 4 inch step without rail assist x15 with cues to step backwards for better foot placement and avoid toe catching; Modified tandem stance with BUE ball up/down x10 each foot in front with CGA for safety; Agility ladder fwd stepping, side stepping x 15 feet  Four square side to side and fwd/ bwd and diagonals x 5  Star stepping single leg with single leg stand x 5 reps  Leg press 120 lbs x 20 x 2   Standing on airex beam:  Tandem stance on airex beam without rail assist 10 sec hold x4 each foot in front with CGA for safety and cues to improve upper trunk control for better balance control; Side stepping down airex beam without rail assist x3 laps each direction with cues to keep feet on beam and avoid stepping off; Standing with feet apart, BUE ball toss x10 unsupportedwith close supervision; Patient exhibits posterior loss of balance requiring cues for forward weight shift;  Standing on 1/2 bolster (flat side up) Feet flat, apart, BUE wand flexion x10 reps with min A For safety; Patient able to keep balance well with minimal posterior loss of balance; stepping over hurdle x 10 fwd / bwd, 10 side to side with UE support    Patient needs cues for technique and form                         PT Education - 10/09/17 1626    Education Details  HEP    Person(s)  Educated  Patient    Methods  Explanation    Comprehension  Verbalized understanding       PT Short Term Goals - 07/31/17 1731      PT SHORT TERM GOAL #1   Title  Patient will increase BLE gross strength to 4+/5 as to improve functional strength for independent gait, increased standing tolerance and increased ADL ability.    Time  4  Period  Weeks    Status  New    Target Date  08/28/17        PT Long Term Goals - 09/27/17 1106      PT LONG TERM GOAL #1   Title  Patient will be independent in home exercise program to improve strength/mobility for better functional independence with ADLs.    Time  8    Period  Weeks    Status  Achieved    Target Date  09/25/17      PT LONG TERM GOAL #2   Title   Patient (< 37 years old) will complete five times sit to stand test in < 10 seconds indicating an increased LE strength and improved balance.    Baseline  14.10 sec: 09/27/17 ; 13.50 sec     Time  8    Period  Weeks    Status  Partially Met    Target Date  10/25/17      PT LONG TERM GOAL #3   Title  Patient will increase six minute walk test distance to >1000 for progression to community ambulator and improve gait ability    Baseline  530 ft : 09/27/17 650 feet    Time  8    Period  Weeks    Status  Partially Met    Target Date  10/25/17      PT LONG TERM GOAL #4   Title  Patient will increase 10 meter walk test to >1.14ms as to improve gait speed for better community ambulation and to reduce fall risk.    Baseline  .80 m/sec : 09/27/17 , .91 sec    Time  8    Period  Weeks    Status  Partially Met    Target Date  10/25/17            Plan - 10/09/17 1627    Clinical Impression Statement  Patient instructed in gait training on level surfaces without Ad with rest periods standing. Patient is able to perform intermediate LE exercises in supine and standing open and closed chain, with no pain behaviors and resting throughout session. Patient performs dynamic standing  balance training with uneven surfaces with CGA. Patient will continue to benefit from skilled PT to improve strength, balance and gait. Patient continues to have LE weakness and decreased static and dynamic standing balance    Rehab Potential  Good    PT Frequency  2x / week    PT Duration  8 weeks    PT Treatment/Interventions  Patient/family education;Therapeutic activities;Therapeutic exercise;Functional mobility training;Balance training;Neuromuscular re-education    PT Next Visit Plan  strengthening closed chain    Consulted and Agree with Plan of Care  Patient       Patient will benefit from skilled therapeutic intervention in order to improve the following deficits and impairments:  Abnormal gait, Decreased balance, Decreased endurance, Decreased mobility, Difficulty walking, Obesity, Decreased strength  Visit Diagnosis: Difficulty in walking, not elsewhere classified  Muscle weakness (generalized)  Abnormality of gait     Problem List Patient Active Problem List   Diagnosis Date Noted  . Elevated alkaline phosphatase level 05/20/2017  . Goals of care, counseling/discussion 05/14/2017  . Chronic renal disease, stage III ([place] 07/08/2015  . Iron deficiency anemia 07/08/2015  . Acute renal failure superimposed on stage 3 chronic kidney disease ([place] 10/22/2014  . History of breast cancer 10/22/2014  . Elevated troponin 10/22/2014  . Pulmonary embolus ([place]   .  Sepsis ([place]) 10/18/2014  . Pulmonary emboli ([place]) 10/18/2014  . Pulmonary embolism ([place]) 10/18/2014  . Candidiasis of breast 07/06/2014  . Malignant neoplasm of right female breast ([place]) 07/24/2012    Arelia Sneddon S,PT DPT 10/09/2017, 5:03 PM  [place] MAIN [facility] SERVICES [address] [place], [place], [address] Phone: [phone]   Fax:  [phone]  Name: QUINTINA HAKEEM MRN: [id number] Date of Birth: 04/22/1965

## 2017-10-22 ENCOUNTER — Ambulatory Visit
Admission: RE | Admit: 2017-10-22 | Discharge: 2017-10-22 | Disposition: A | Discharge status: Home / Self Care | Payer: BLUE CROSS/BLUE SHIELD | Source: Ambulatory Visit | Attending: General Surgery | Admitting: General Surgery

## 2017-10-22 ENCOUNTER — Other Ambulatory Visit: Payer: BLUE CROSS/BLUE SHIELD

## 2017-10-22 DIAGNOSIS — Z170 Estrogen receptor positive status [ER+]: Secondary | ICD-10-CM

## 2017-10-22 DIAGNOSIS — C50111 Malignant neoplasm of central portion of right female breast: Secondary | ICD-10-CM | POA: Diagnosis present

## 2017-10-22 HISTORY — DX: Personal history of irradiation: Z92.3

## 2017-10-23 ENCOUNTER — Other Ambulatory Visit: Payer: BLUE CROSS/BLUE SHIELD

## 2017-10-25 ENCOUNTER — Telehealth: Payer: Self-pay | Admitting: *Deleted

## 2017-10-25 NOTE — Telephone Encounter (Signed)
Re:  BCI testing  Call returned, as requested, to [facility]. Patient has agreed to proceed with BCI testing following lengthy financial discussion yesterday. Company asking for requisition and sample to be sent. Will complete today and send for testing.   Honor Loh, MSN, APRN, FNP-C, CEN Oncology/Hematology Nurse Practitioner  [facility] 10/25/17, 2:23 PM

## 2017-10-25 NOTE — Telephone Encounter (Signed)
Laureen from [facility] would like to discuss this patient as a candidate for Breast Cancer Index. She has discussed this phone conversation with patient and patient approves. [phone]

## 2017-11-15 ENCOUNTER — Inpatient Hospital Stay
Admission: AD | Admit: 2017-11-15 | Discharge: 2017-11-21 | DRG: 683 | Disposition: A | Discharge status: Home / Self Care | Payer: BLUE CROSS/BLUE SHIELD | Source: Ambulatory Visit | Attending: Internal Medicine | Admitting: Internal Medicine

## 2017-11-15 ENCOUNTER — Encounter: Payer: Self-pay | Admitting: Hematology and Oncology

## 2017-11-15 ENCOUNTER — Inpatient Hospital Stay (HOSPITAL_BASED_OUTPATIENT_CLINIC_OR_DEPARTMENT_OTHER): Payer: BLUE CROSS/BLUE SHIELD | Admitting: Hematology and Oncology

## 2017-11-15 ENCOUNTER — Other Ambulatory Visit: Payer: Self-pay

## 2017-11-15 ENCOUNTER — Inpatient Hospital Stay: Payer: BLUE CROSS/BLUE SHIELD | Attending: Hematology and Oncology

## 2017-11-15 VITALS — BP 173/77 | HR 85 | Temp 98.4°F | Resp 18 | Wt 336.2 lb

## 2017-11-15 DIAGNOSIS — T502X5A Adverse effect of carbonic-anhydrase inhibitors, benzothiadiazides and other diuretics, initial encounter: Secondary | ICD-10-CM | POA: Diagnosis present

## 2017-11-15 DIAGNOSIS — E1121 Type 2 diabetes mellitus with diabetic nephropathy: Secondary | ICD-10-CM | POA: Diagnosis present

## 2017-11-15 DIAGNOSIS — Z86711 Personal history of pulmonary embolism: Secondary | ICD-10-CM

## 2017-11-15 DIAGNOSIS — E039 Hypothyroidism, unspecified: Secondary | ICD-10-CM | POA: Diagnosis present

## 2017-11-15 DIAGNOSIS — N183 Chronic kidney disease, stage 3 (moderate): Secondary | ICD-10-CM | POA: Diagnosis present

## 2017-11-15 DIAGNOSIS — E871 Hypo-osmolality and hyponatremia: Secondary | ICD-10-CM | POA: Diagnosis not present

## 2017-11-15 DIAGNOSIS — C50211 Malignant neoplasm of upper-inner quadrant of right female breast: Secondary | ICD-10-CM | POA: Insufficient documentation

## 2017-11-15 DIAGNOSIS — E1122 Type 2 diabetes mellitus with diabetic chronic kidney disease: Secondary | ICD-10-CM

## 2017-11-15 DIAGNOSIS — R748 Abnormal levels of other serum enzymes: Secondary | ICD-10-CM | POA: Insufficient documentation

## 2017-11-15 DIAGNOSIS — I129 Hypertensive chronic kidney disease with stage 1 through stage 4 chronic kidney disease, or unspecified chronic kidney disease: Secondary | ICD-10-CM | POA: Diagnosis present

## 2017-11-15 DIAGNOSIS — D509 Iron deficiency anemia, unspecified: Secondary | ICD-10-CM | POA: Insufficient documentation

## 2017-11-15 DIAGNOSIS — Z170 Estrogen receptor positive status [ER+]: Secondary | ICD-10-CM

## 2017-11-15 DIAGNOSIS — Z79899 Other long term (current) drug therapy: Secondary | ICD-10-CM | POA: Insufficient documentation

## 2017-11-15 DIAGNOSIS — R319 Hematuria, unspecified: Secondary | ICD-10-CM | POA: Diagnosis present

## 2017-11-15 DIAGNOSIS — E878 Other disorders of electrolyte and fluid balance, not elsewhere classified: Secondary | ICD-10-CM | POA: Diagnosis present

## 2017-11-15 DIAGNOSIS — Z992 Dependence on renal dialysis: Secondary | ICD-10-CM

## 2017-11-15 DIAGNOSIS — C50911 Malignant neoplasm of unspecified site of right female breast: Secondary | ICD-10-CM

## 2017-11-15 DIAGNOSIS — E875 Hyperkalemia: Secondary | ICD-10-CM | POA: Diagnosis not present

## 2017-11-15 DIAGNOSIS — M109 Gout, unspecified: Secondary | ICD-10-CM | POA: Diagnosis present

## 2017-11-15 DIAGNOSIS — E860 Dehydration: Secondary | ICD-10-CM | POA: Insufficient documentation

## 2017-11-15 DIAGNOSIS — I2782 Chronic pulmonary embolism: Secondary | ICD-10-CM | POA: Insufficient documentation

## 2017-11-15 DIAGNOSIS — Z7984 Long term (current) use of oral hypoglycemic drugs: Secondary | ICD-10-CM

## 2017-11-15 DIAGNOSIS — Z7901 Long term (current) use of anticoagulants: Secondary | ICD-10-CM | POA: Insufficient documentation

## 2017-11-15 DIAGNOSIS — D649 Anemia, unspecified: Secondary | ICD-10-CM | POA: Diagnosis present

## 2017-11-15 DIAGNOSIS — G8929 Other chronic pain: Secondary | ICD-10-CM | POA: Insufficient documentation

## 2017-11-15 DIAGNOSIS — Z6841 Body Mass Index (BMI) 40.0 and over, adult: Secondary | ICD-10-CM | POA: Diagnosis not present

## 2017-11-15 DIAGNOSIS — I10 Essential (primary) hypertension: Secondary | ICD-10-CM | POA: Diagnosis not present

## 2017-11-15 DIAGNOSIS — R81 Glycosuria: Secondary | ICD-10-CM | POA: Diagnosis present

## 2017-11-15 DIAGNOSIS — F329 Major depressive disorder, single episode, unspecified: Secondary | ICD-10-CM | POA: Diagnosis present

## 2017-11-15 DIAGNOSIS — Z853 Personal history of malignant neoplasm of breast: Secondary | ICD-10-CM

## 2017-11-15 DIAGNOSIS — N179 Acute kidney failure, unspecified: Secondary | ICD-10-CM | POA: Diagnosis present

## 2017-11-15 DIAGNOSIS — Z79811 Long term (current) use of aromatase inhibitors: Secondary | ICD-10-CM

## 2017-11-15 DIAGNOSIS — Z923 Personal history of irradiation: Secondary | ICD-10-CM

## 2017-11-15 DIAGNOSIS — R110 Nausea: Secondary | ICD-10-CM | POA: Diagnosis not present

## 2017-11-15 DIAGNOSIS — N289 Disorder of kidney and ureter, unspecified: Secondary | ICD-10-CM

## 2017-11-15 DIAGNOSIS — E785 Hyperlipidemia, unspecified: Secondary | ICD-10-CM | POA: Diagnosis present

## 2017-11-15 LAB — CBC WITH DIFFERENTIAL/PLATELET
Basophils Absolute: 0.1 10*3/uL (ref 0–0.1)
Basophils Relative: 1 %
Eosinophils Absolute: 0.3 10*3/uL (ref 0–0.7)
Eosinophils Relative: 2 %
HCT: 32.3 % — ABNORMAL LOW (ref 35.0–47.0)
Hemoglobin: 10.3 g/dL — ABNORMAL LOW (ref 12.0–16.0)
Lymphocytes Relative: 11 %
Lymphs Abs: 1.7 10*3/uL (ref 1.0–3.6)
MCH: 29.4 pg (ref 26.0–34.0)
MCHC: 31.9 g/dL — ABNORMAL LOW (ref 32.0–36.0)
MCV: 92.2 fL (ref 80.0–100.0)
Monocytes Absolute: 1.2 10*3/uL — ABNORMAL HIGH (ref 0.2–0.9)
Monocytes Relative: 8 %
Neutro Abs: 11.9 10*3/uL — ABNORMAL HIGH (ref 1.4–6.5)
Neutrophils Relative %: 78 %
Platelets: 232 10*3/uL (ref 150–440)
RBC: 3.5 MIL/uL — ABNORMAL LOW (ref 3.80–5.20)
RDW: 15.6 % — ABNORMAL HIGH (ref 11.5–14.5)
WBC: 15.1 10*3/uL — ABNORMAL HIGH (ref 3.6–11.0)

## 2017-11-15 LAB — COMPREHENSIVE METABOLIC PANEL
ALT: 21 U/L (ref 0–44)
AST: 37 U/L (ref 15–41)
Albumin: 3.9 g/dL (ref 3.5–5.0)
Alkaline Phosphatase: 115 U/L (ref 38–126)
Anion gap: 14 (ref 5–15)
BUN: 59 mg/dL — ABNORMAL HIGH (ref 6–20)
CO2: 20 mmol/L — ABNORMAL LOW (ref 22–32)
Calcium: 9.4 mg/dL (ref 8.9–10.3)
Chloride: 95 mmol/L — ABNORMAL LOW (ref 98–111)
Creatinine, Ser: 4.54 mg/dL — ABNORMAL HIGH (ref 0.44–1.00)
GFR calc Af Amer: 12 mL/min — ABNORMAL LOW (ref >60)
GFR calc non Af Amer: 10 mL/min — ABNORMAL LOW (ref >60)
Glucose, Bld: 121 mg/dL — ABNORMAL HIGH (ref 70–99)
Potassium: 4.9 mmol/L (ref 3.5–5.1)
Sodium: 129 mmol/L — ABNORMAL LOW (ref 135–145)
Total Bilirubin: 0.8 mg/dL (ref 0.3–1.2)
Total Protein: 7.1 g/dL (ref 6.5–8.1)

## 2017-11-15 LAB — TSH: TSH: 0.988 u[IU]/mL (ref 0.350–4.500)

## 2017-11-15 LAB — FERRITIN: Ferritin: 92 ng/mL (ref 11–307)

## 2017-11-15 LAB — GLUCOSE, CAPILLARY: Glucose-Capillary: 80 mg/dL (ref 70–99)

## 2017-11-15 MED ORDER — FERROUS SULFATE 325 (65 FE) MG PO TABS
325.0000 mg | ORAL_TABLET | Freq: Two times a day (BID) | ORAL | Status: DC
  Administered 2017-11-16 – 2017-11-21 (×11): 325 mg via ORAL
  Filled 2017-11-15 (×11): qty 1

## 2017-11-15 MED ORDER — VITAMIN C 500 MG PO TABS
500.0000 mg | ORAL_TABLET | Freq: Two times a day (BID) | ORAL | Status: DC
  Administered 2017-11-15 – 2017-11-21 (×12): 500 mg via ORAL
  Filled 2017-11-15 (×12): qty 1

## 2017-11-15 MED ORDER — SERTRALINE HCL 50 MG PO TABS
50.0000 mg | ORAL_TABLET | Freq: Every day | ORAL | Status: DC
  Administered 2017-11-16 – 2017-11-21 (×6): 50 mg via ORAL
  Filled 2017-11-15 (×6): qty 1

## 2017-11-15 MED ORDER — ONDANSETRON HCL 4 MG PO TABS: 4.0000 mg | ORAL_TABLET | Freq: Four times a day (QID) | ORAL | Status: DC | PRN

## 2017-11-15 MED ORDER — HYDRALAZINE HCL 20 MG/ML IJ SOLN: 10.0000 mg | INTRAMUSCULAR | Status: DC | PRN

## 2017-11-15 MED ORDER — HYDROCODONE-ACETAMINOPHEN 5-325 MG PO TABS
1.0000 | ORAL_TABLET | ORAL | Status: DC | PRN
  Administered 2017-11-15 – 2017-11-18 (×6): 2 via ORAL
  Filled 2017-11-15 (×6): qty 2

## 2017-11-15 MED ORDER — LEVOTHYROXINE SODIUM 112 MCG PO TABS
112.0000 ug | ORAL_TABLET | Freq: Every day | ORAL | Status: DC
  Administered 2017-11-16 – 2017-11-21 (×6): 112 ug via ORAL
  Filled 2017-11-15 (×6): qty 1

## 2017-11-15 MED ORDER — SODIUM CHLORIDE 0.9 % IV SOLN
INTRAVENOUS | Status: DC
  Administered 2017-11-15 – 2017-11-20 (×6): via INTRAVENOUS

## 2017-11-15 MED ORDER — FOLIC ACID 1 MG PO TABS
1000.0000 ug | ORAL_TABLET | Freq: Every day | ORAL | Status: DC
  Administered 2017-11-16 – 2017-11-21 (×6): 1 mg via ORAL
  Filled 2017-11-15 (×6): qty 1

## 2017-11-15 MED ORDER — ACETAMINOPHEN 650 MG RE SUPP: 650.0000 mg | Freq: Four times a day (QID) | RECTAL | Status: DC | PRN

## 2017-11-15 MED ORDER — ATORVASTATIN CALCIUM 20 MG PO TABS
20.0000 mg | ORAL_TABLET | Freq: Every day | ORAL | Status: DC
  Administered 2017-11-15 – 2017-11-21 (×7): 20 mg via ORAL
  Filled 2017-11-15 (×9): qty 1

## 2017-11-15 MED ORDER — FERROUS SULFATE 325 (65 FE) MG PO TABS
325.0000 mg | ORAL_TABLET | Freq: Once | ORAL | Status: AC
  Administered 2017-11-15: 23:00:00 325 mg via ORAL
  Filled 2017-11-15: qty 1

## 2017-11-15 MED ORDER — ACETAMINOPHEN 325 MG PO TABS
650.0000 mg | ORAL_TABLET | Freq: Four times a day (QID) | ORAL | Status: DC | PRN
  Administered 2017-11-16: 11:00:00 650 mg via ORAL
  Filled 2017-11-15: qty 2

## 2017-11-15 MED ORDER — ONDANSETRON HCL 4 MG/2ML IJ SOLN
4.0000 mg | Freq: Four times a day (QID) | INTRAMUSCULAR | Status: DC | PRN
  Administered 2017-11-16 (×2): 4 mg via INTRAVENOUS
  Filled 2017-11-15 (×2): qty 2

## 2017-11-15 MED ORDER — APIXABAN 5 MG PO TABS
5.0000 mg | ORAL_TABLET | Freq: Two times a day (BID) | ORAL | Status: DC
  Administered 2017-11-15: 5 mg via ORAL
  Filled 2017-11-15 (×2): qty 1

## 2017-11-15 MED ORDER — INSULIN ASPART 100 UNIT/ML ~~LOC~~ SOLN
0.0000 [IU] | Freq: Three times a day (TID) | SUBCUTANEOUS | Status: DC
  Administered 2017-11-19: 12:00:00 2 [IU] via SUBCUTANEOUS
  Administered 2017-11-20: 3 [IU] via SUBCUTANEOUS
  Filled 2017-11-15 (×2): qty 1

## 2017-11-15 MED ORDER — INSULIN ASPART 100 UNIT/ML ~~LOC~~ SOLN: 0.0000 [IU] | Freq: Every day | SUBCUTANEOUS | Status: DC

## 2017-11-15 MED ORDER — ALPRAZOLAM 0.5 MG PO TABS
0.5000 mg | ORAL_TABLET | Freq: Two times a day (BID) | ORAL | Status: DC | PRN
  Administered 2017-11-19: 04:00:00 0.5 mg via ORAL
  Filled 2017-11-15: qty 1

## 2017-11-15 MED ORDER — LETROZOLE 2.5 MG PO TABS
2.5000 mg | ORAL_TABLET | Freq: Every day | ORAL | Status: DC
  Administered 2017-11-15 – 2017-11-21 (×7): 2.5 mg via ORAL
  Filled 2017-11-15 (×8): qty 1

## 2017-11-15 MED ORDER — POLYETHYLENE GLYCOL 3350 17 G PO PACK
17.0000 g | PACK | Freq: Every day | ORAL | Status: DC | PRN
  Administered 2017-11-20: 17 g via ORAL
  Filled 2017-11-15: qty 1

## 2017-11-15 NOTE — Progress Notes (Signed)
[facility] day:  11/15/17     Chief Complaint: Nicole Schmidt is a 52 y.o. female with stage I breast cancer, history of submassive pulmonary embolism, and iron deficiency anemia who is seen for 6 month assessment on Femara.  HPI: The patient was last seen in the medical oncology clinic on 05/14/2017.  At that time, she felt good. She complained of joint pains. Exam revealed post radiation changes in her RIGHT breast. CBC was normal. Creatinine was 1.39.  Alkaline phosphatase was elevated at 148.  Bilateral diagnostic mammogram on 10/22/2017 revealed no evidence of malignancy within either breast.  There were stable postsurgical changes within the RIGHT breast.  Recommendation for screening mammogram in 1 year.  BCI testing has been sent. Call placed to [facility] earlier today to check status, as requisition was sent on 10/25/2017. There was a apparently a delay in receiving the FFPE block. Block received on 11/13/2017. Testing is currently in process.    Patient has been having trouble with her blood pressure. She has had an additional antihypertensive medication (Clonidine) medication added. Clonidine has since then been doubled to 0.2 mg.  Patient complains of not voiding like she normally does. Patient is drinking adequate amounts of fluids. Patient is on Zestoretic 20/25 mg  and furosemide 20 mg. She denies known renal issues. She states, "my doctor told me that my kidney function was sluggish last time I saw him". Last visit to her PCP was on 11/07/2017, at which time her BUN was 35 and creatine was 1.43.  Patient denies any B symptoms. She denies any interval infections.  Patient is exertionally dyspneic.Patient does not verbalize any concerns with regards to her breasts today. Patient performs monthly self breast examinations as recommended. Patient continues her letrozole as previously prescribed.   Patient advises that she maintains an  adequate appetite. She is eating well. Weight today is (!) 336 lb 4 oz (152.5 kg), which compared to her last visit to the clinic, represents a 21 pound weight gain.   Patient denies pain in the clinic today.   Past Medical History:  Diagnosis Date  . Anemia   . Arthritis   . Breast cancer of upper-inner quadrant of right female breast ([place])    Right breast invasive CA and DCIS , T1,N0,M0. Er/PR pos, her 2 negative  . Diabetes mellitus without complication ([place])   . Gout   . Gout   . Hypertension   . Menopause    age 6  . Personal history of radiation therapy   . Pulmonary embolism ([place]) 10/2014  . Renal insufficiency   . Thyroid goiter     Past Surgical History:  Procedure Laterality Date  . BREAST BIOPSY Right 2014   breast ca  . BREAST EXCISIONAL BIOPSY Right 07/19/2012   breast ca  . BREAST SURGERY Right 2014   with sentinel node bx subareaolar duct excision  . COLONOSCOPY WITH PROPOFOL N/A 01/30/2017   Procedure: COLONOSCOPY WITH PROPOFOL;  Surgeon: Christene Lye, MD;  Location: [facility] ENDOSCOPY;  Service: Endoscopy;  Laterality: N/A;    Family History  Problem Relation Age of Onset  . Diabetes Father   . Hypertension Father   . Parkinson's disease Father   . Hypertension Mother   . Rheum arthritis Mother   . Atrial fibrillation Mother   . Heart failure Mother   . Colon cancer Cousin 12  . Breast cancer Neg Hx     Social History:  reports that she has never smoked. She has never used smokeless tobacco. She reports that she does not drink alcohol or use drugs.  Her mother died of a MI.  Her father died in 2015/05/24.  He had Parkinson's disease and dementia.  She is an only child.  She has never married.  The patient is alone today.  Allergies: No Known Allergies  Current Medications: Current Outpatient Medications  Medication Sig Dispense Refill  . allopurinol (ZYLOPRIM) 300 MG tablet Take 300 mg by mouth daily.     Marland Kitchen ALPRAZolam (XANAX) 0.5 MG tablet  Take 0.5 mg by mouth as needed.    . Ascorbic Acid (VITAMIN C PO) Take 1 tablet by mouth 2 (two) times daily.     Marland Kitchen atorvastatin (LIPITOR) 20 MG tablet Take 20 mg by mouth daily.    . Calcium Carbonate-Vitamin D (CALCIUM 600 + D PO) Take 1 tablet by mouth daily.    Marland Kitchen CINNAMON PO Take 2,000 mg by mouth daily.    . cloNIDine (CATAPRES) 0.1 MG tablet Take 0.1 mg by mouth every morning.  0  . Dulaglutide (TRULICITY) 1.5 LN/9.8XQ SOPN Inject into the skin once a week.    Marland Kitchen ELIQUIS 5 MG TABS tablet TAKE 1 TABLET TWICE A DAY 180 tablet 1  . ergocalciferol (VITAMIN D2) 50000 UNITS capsule Take 50,000 Units by mouth once a week. Take on Friday mornings    . ferrous sulfate 325 (65 FE) MG tablet Take 325 mg by mouth 2 (two) times daily with a meal.     . folic acid (FOLVITE) 119 MCG tablet Take 800 mcg by mouth daily.     . furosemide (LASIX) 20 MG tablet Take 20 mg by mouth.    . letrozole (FEMARA) 2.5 MG tablet TAKE 1 TABLET DAILY 90 tablet 1  . levothyroxine (SYNTHROID, LEVOTHROID) 112 MCG tablet Take 112 mcg daily before breakfast by mouth.    Marland Kitchen lisinopril-hydrochlorothiazide (PRINZIDE,ZESTORETIC) 20-25 MG per tablet Take 1 tablet by mouth daily.    . metFORMIN (GLUCOPHAGE) 1000 MG tablet Take 1 tablet by mouth 2 (two) times daily.    . ONE TOUCH ULTRA TEST test strip     . ONETOUCH DELICA LANCETS FINE MISC     . sertraline (ZOLOFT) 50 MG tablet Take 50 mg by mouth daily.     No current facility-administered medications for this visit.     Review of Systems  Constitutional: Negative for diaphoresis, fever, malaise/fatigue and weight loss (up 21 pounds).       "I just cant tinkle like I normally do".   HENT: Negative.   Eyes: Negative.   Respiratory: Positive for shortness of breath (exertional). Negative for cough, hemoptysis and sputum production.   Cardiovascular: Negative for chest pain, palpitations, orthopnea, leg swelling and PND.       HTN requiring multi-drug management   Gastrointestinal: Negative for abdominal pain, blood in stool, constipation, diarrhea, melena, nausea and vomiting.  Genitourinary: Negative for dysuria, frequency, hematuria and urgency.       Inability to void  Musculoskeletal: Positive for joint pain. Negative for back pain, falls and myalgias.  Skin: Negative for itching and rash.  Neurological: Negative for dizziness, tremors, weakness and headaches.  Endo/Heme/Allergies: Does not bruise/bleed easily.       PMH (+) for diabetes  Psychiatric/Behavioral: Negative for depression, memory loss and suicidal ideas. The patient is not nervous/anxious and does not have insomnia.   All other systems reviewed and are negative.  Performance  status (ECOG): 1 - Symptomatic but completely ambulatory  Vital Signs: BP (!) 173/77 (BP Location: Left Arm, Patient Position: Sitting) Comment: Patient just started new BP medication.  Pulse 85   Temp 98.4 F (36.9 C) (Tympanic)   Resp 18   Wt (!) 336 lb 4 oz (152.5 kg)   LMP 07/17/2012   BMI 59.56 kg/m   Physical Exam  Constitutional: She is oriented to person, place, and time and well-developed, well-nourished, and in no distress.  HENT:  Head: Normocephalic and atraumatic.  Brown hair  Eyes: Pupils are equal, round, and reactive to light. EOM are normal. No scleral icterus.  Blue eyes  Neck: Normal range of motion. Neck supple. No tracheal deviation present. No thyromegaly present.  Cardiovascular: Normal rate, regular rhythm and normal heart sounds. Exam reveals no gallop and no friction rub.  No murmur heard. Pulmonary/Chest: Effort normal and breath sounds normal. No respiratory distress. She has no wheezes. She has no rales. Right breast exhibits skin change (post operative and post radiation changes. Chronic inferior edema. Moderate fibrocystic changes). Right breast exhibits no inverted nipple, no mass and no nipple discharge. Left breast exhibits skin change (fibrocystic changes in the  upper outer quadrant). Left breast exhibits no inverted nipple, no mass and no nipple discharge.  Abdominal: Soft. Bowel sounds are normal. She exhibits no distension. There is no tenderness.  Musculoskeletal: Normal range of motion. She exhibits no edema or tenderness.  Chronic lower extremity changes  Lymphadenopathy:    She has no cervical adenopathy.    She has no axillary adenopathy.       Right: No inguinal and no supraclavicular adenopathy present.       Left: No inguinal and no supraclavicular adenopathy present.  Neurological: She is alert and oriented to person, place, and time.  Skin: Skin is warm and dry. No rash noted. No erythema.  Psychiatric: Mood, affect and judgment normal.  Nursing note and vitals reviewed.   Appointment on 11/15/2017  Component Date Value Ref Range Status  . Ferritin 11/15/2017 92  11 - 307 ng/mL Final   Performed at [facility], [address] [place], [place] [address]  . WBC 11/15/2017 15.1* 3.6 - 11.0 K/uL Final  . RBC 11/15/2017 3.50* 3.80 - 5.20 MIL/uL Final  . Hemoglobin 11/15/2017 10.3* 12.0 - 16.0 g/dL Final  . HCT 11/15/2017 32.3* 35.0 - 47.0 % Final  . MCV 11/15/2017 92.2  80.0 - 100.0 fL Final  . MCH 11/15/2017 29.4  26.0 - 34.0 pg Final  . MCHC 11/15/2017 31.9* 32.0 - 36.0 g/dL Final  . RDW 11/15/2017 15.6* 11.5 - 14.5 % Final  . Platelets 11/15/2017 232  150 - 440 K/uL Final  . Neutrophils Relative % 11/15/2017 78  % Final  . Neutro Abs 11/15/2017 11.9* 1.4 - 6.5 K/uL Final  . Lymphocytes Relative 11/15/2017 11  % Final  . Lymphs Abs 11/15/2017 1.7  1.0 - 3.6 K/uL Final  . Monocytes Relative 11/15/2017 8  % Final  . Monocytes Absolute 11/15/2017 1.2* 0.2 - 0.9 K/uL Final  . Eosinophils Relative 11/15/2017 2  % Final  . Eosinophils Absolute 11/15/2017 0.3  0 - 0.7 K/uL Final  . Basophils Relative 11/15/2017 1  % Final  . Basophils Absolute 11/15/2017 0.1  0 - 0.1 K/uL Final   Performed at [facility], [address], [place], [place] [address]  . Sodium 11/15/2017 129* 135 - 145 mmol/L Final  . Potassium 11/15/2017 4.9  3.5 - 5.1 mmol/L Final  . Chloride 11/15/2017 95* 98 - 111 mmol/L Final  . CO2 11/15/2017 20* 22 - 32 mmol/L Final  . Glucose, Bld 11/15/2017 121* 70 - 99 mg/dL Final  . BUN 11/15/2017 59* 6 - 20 mg/dL Final  . Creatinine, Ser 11/15/2017 4.54* 0.44 - 1.00 mg/dL Final  . Calcium 11/15/2017 9.4  8.9 - 10.3 mg/dL Final  . Total Protein 11/15/2017 7.1  6.5 - 8.1 g/dL Final  . Albumin 11/15/2017 3.9  3.5 - 5.0 g/dL Final  . AST 11/15/2017 37  15 - 41 U/L Final  . ALT 11/15/2017 21  0 - 44 U/L Final  . Alkaline Phosphatase 11/15/2017 115  38 - 126 U/L Final  . Total Bilirubin 11/15/2017 0.8  0.3 - 1.2 mg/dL Final  . GFR calc non Af Amer 11/15/2017 10* >60 mL/min Final  . GFR calc Af Amer 11/15/2017 12* >60 mL/min Final   Comment: (NOTE) The eGFR has been calculated using the CKD EPI equation. This calculation has not been validated in all clinical situations. eGFR's persistently <60 mL/min signify possible Chronic Kidney Disease.   Georgiann Hahn gap 11/15/2017 14  5 - 15 Final   Performed at [facility], [address] [place], [place] [address]    Assessment:  ELLISSA AYO is a 52 y.o. female with a history of stage I right breast cancer status post wide local incision on 08/12/2012.  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  Sentinel lymph node was negative.  Tumor was ER/PR positive and Her2/neu negative.  Oncotype DX score was 14 (low risk).  She completed breast radiation in 10/2012.  She began tamoxifen in 10/2012, but discontinued it on 10/19/2014 following a submassive PE.    Bilateral diagnostic mammogram on 10/10/2016 revealed no mammographic evidence for malignancy.  CA27.29 has been followed: 21.7 on 04/24/2013, 21.4 on 07/31/2013, 17.9 on 03/09/2014, 20.7 on 01/18/2015, 17.2 on 06/10/2015, 22.5 on 08/13/2015, 16.6 on 11/08/2015, 19.8  on 01/31/2016, 25.2 on 05/29/2016, 19.3 on 11/30/2016, and 22.1 on 05/14/2017.  She had a bilateral submassive pulmonary emboli on 10/18/2014.  Chest CT angiogram revealed submassive PE with right heart strain.  She received catheter directed lytic [facility] at [facility].  Lower extremity duplex was negative for DVT.  She was on Xarelto (discontinued secondary to oral bleeding).  She began Eliquis on 07/08/2015.  Hypercoagulable work-up revealed the following normal studies:  Factor V Leiden, prothrombin gene mutation, lupus anticoagulant panel, anticardiolipin antibodies, protein S total (127%), protein S activity (99%), protein C total (71%), protein C activity (77%), and ATIII activity (82%).  Bone density study on 01/26/2015 was normal with a T score of -0.3.  She has had no menses since 07/2012.  Labs on 01/18/2015 confirmed a post-menopausal status.  She began Femara on 06/10/2015.  She was noted to have a normocytic anemia on 06/10/2015.  Labs on 07/08/2015 revealed documented iron deficiency.  Ferritin was 25, iron saturation 6%, TIBC 473 (high).  Normal labs included B12, folate, SPEP revealed, LFTs, and guaiac cards x 3.   She is on oral iron with vitamin C.  Ferritin has been followed:  25 on 07/08/2015, 30 on 11/08/2015, 46 on 01/31/2016, 37 on 05/29/2016, and 53 on 05/14/2017.  She was noted to have stage III renal insufficiency on 06/10/2015.  Creatinine was 1.34 (CrCl 45 ml/min) with prior baseline Cr of 0.86 - 0.93. She was seen by nephrology.  Etiology was felt secondary  to dehydration.  Creatinine is 1.19 today.  Symptomatically, her urine output has decreased. She has maintained adequate hydration. She has been having difficulties with her blood pressure, which has resulted in changes to her antihypertensive regimen. She has chronic pains in her joints. Exam is stable as compared to previous. WBC 15,100 (ANC 11,900). Hemoglobin 10.3, hematocrit 32.3. BUN 59 and creatinine  4.54.  Plan: 1. Labs today:  CBC with diff, CMP, ferritin, CA27.29. 2. Stage I right breast cancer:  Review interval mammogram- no evidence of disease.  Plan screening mammogram on 11/02/2018.  Continue Femara.  Discuss length of therapy.    BCI testing sent 10/25/2017; results pending.  3. Acute renal failure  Routine labs on 11/07/2017 with PCP revealed a BUN of 43 and creatinine of 1.43  Labs today reveal an acute decrease in renal function; BUN 59 and creatinine 4.54.  Significant weight gain of 21 pounds in the last 6 months.   Patient is on both loop and thiazine diuretic therapy for her HTN, however these interventions are chronic.   Clonidine recently added, and titrated to current dose of 0.2 mg daily.   Spoke with nephrology Holley Raring, MD). Recommended inpatient admission for further evaluation. Patient agrees.  4. HYPOnatremia  Sodium low at 129.   Multifactorial issue related to diuretic therapy and ARF.   Anticipate correction as an inpatient.  5. Iron deficiency anemia  Patient on daily oral iron supplement that she takes with a source of vitamin C.  Ferritin stable at 92 (goal 100).  Continue oral supplementation as previously directed.  6. Elevated alkaline phosphatase  ALP normal today at 115.  Etiology of previous elevations unclear.  Labs on 06/13/2017:  ALP 176 (39-117) with 32% bone fraction, 33% liver fraction and 35% intestinal fraction (elevated). 7. History of bilateral pulmonary embolism  Continues on chronic anticoagulation with apixaban.   Medication will be held secondary to renal insufficiency.  Denies bruising or bleeding.  8. Preventative care  Discuss bone density. Last was done on 01/26/2015.  Schedule repeat study due to risk of osteopenia associated with AI therapy.  9. RTC in 6 months for MD assessment and labs (CBC with diff, CMP, ferritin, CA27.29).   Honor Loh, NP  11/15/2017, 5:43 PM   I saw and evaluated the  patient, participating in the key portions of the service and reviewing pertinent diagnostic studies and records.  I reviewed the nurse practitioner's note and agree with the findings and the plan.  Multiple questions were asked by the patient and answered.   Nolon Stalls, MD 11/15/2017,5:43 PM

## 2017-11-15 NOTE — Progress Notes (Signed)
Patient states she has just returned to work after 6 month medical leave.  States it is very hot where she works.  She has been drinking plenty of fluids but has not been able to urinate much.  She just saw Dr Ronnald Collum last week and her kidneys are sluggish.  She has gained weight.

## 2017-11-15 NOTE — H&P (Signed)
[facility] [place] NAME: Nicole Schmidt    MR#:  [id number]  DATE OF BIRTH:  1965-02-23  DATE OF ADMISSION:  11/15/2017  PRIMARY CARE PHYSICIAN: Lenard Simmer, MD   REQUESTING/REFERRING PHYSICIAN:   CHIEF COMPLAINT:  No chief complaint on file.   HISTORY OF PRESENT ILLNESS: Nicole Schmidt  is a 52 y.o. female with a known history per below presenting as a direct admission from Dr. Corcoran/oncology's office today for noted acute renal failure, creatinine 4.5 up from 1.3 last check, patient states that she was recently started on clonidine a week or so ago by her primary care provider, recently increased a few days ago, 2 days ago she noted a severe drop in her urination, complains of 1 week of 6 pound weight gain, complains of generalized weakness, fatigue, lack of strength and energy, blood work noted for sodium 129, chloride 95, creatinine 4.5, white count 15,000, patient denies any pain, patient is now been admitted for acute renal failure most likely secondary to medication side effect.  PAST MEDICAL HISTORY:   Past Medical History:  Diagnosis Date  . Anemia   . Arthritis   . Breast cancer of upper-inner quadrant of right female breast ([place])    Right breast invasive CA and DCIS , T1,N0,M0. Er/PR pos, her 2 negative  . Diabetes mellitus without complication ([place])   . Gout   . Gout   . Hypertension   . Menopause    age 50  . Personal history of radiation therapy   . Pulmonary embolism ([place]) 10/2014  . Renal insufficiency   . Thyroid goiter     PAST SURGICAL HISTORY:  Past Surgical History:  Procedure Laterality Date  . BREAST BIOPSY Right 2014   breast ca  . BREAST EXCISIONAL BIOPSY Right 07/19/2012   breast ca  . BREAST SURGERY Right 2014   with sentinel node bx subareaolar duct excision  . COLONOSCOPY WITH PROPOFOL N/A 01/30/2017   Procedure: COLONOSCOPY WITH PROPOFOL;  Surgeon: Christene Lye, MD;  Location: [facility]  ENDOSCOPY;  Service: Endoscopy;  Laterality: N/A;    SOCIAL HISTORY:  Social History   Tobacco Use  . Smoking status: Never Smoker  . Smokeless tobacco: Never Used  Substance Use Topics  . Alcohol use: No    Alcohol/week: 0.0 standard drinks    FAMILY HISTORY:  Family History  Problem Relation Age of Onset  . Diabetes Father   . Hypertension Father   . Parkinson's disease Father   . Hypertension Mother   . Rheum arthritis Mother   . Atrial fibrillation Mother   . Heart failure Mother   . Colon cancer Cousin 63  . Breast cancer Neg Hx     DRUG ALLERGIES: No Known Allergies  REVIEW OF SYSTEMS:   CONSTITUTIONAL: No fever, fatigue or weakness.  EYES: No blurred or double vision.  EARS, NOSE, AND THROAT: No tinnitus or ear pain.  RESPIRATORY: No cough, shortness of breath, wheezing or hemoptysis.  CARDIOVASCULAR: No chest pain, orthopnea, edema.  GASTROINTESTINAL: No nausea, vomiting, diarrhea or abdominal pain.  GENITOURINARY: No dysuria, hematuria.  ENDOCRINE: No polyuria, nocturia,  HEMATOLOGY: No anemia, easy bruising or bleeding SKIN: No rash or lesion. MUSCULOSKELETAL: No joint pain or arthritis.   NEUROLOGIC: No tingling, numbness, weakness.  PSYCHIATRY: No anxiety or depression.   MEDICATIONS AT HOME:  Prior to Admission medications   Medication Sig Start Date End Date Taking? Authorizing Provider  allopurinol (ZYLOPRIM) 300  MG tablet Take 300 mg by mouth daily.  12/31/14   [provider]  ALPRAZolam Duanne Moron) 0.5 MG tablet Take 0.5 mg by mouth as needed. 09/21/17   [provider]  Ascorbic Acid (VITAMIN C PO) Take 1 tablet by mouth 2 (two) times daily.     [provider]  atorvastatin (LIPITOR) 20 MG tablet Take 20 mg by mouth daily.    [provider]  Calcium Carbonate-Vitamin D (CALCIUM 600 + D PO) Take 1 tablet by mouth daily.    [provider]  CINNAMON PO Take 2,000 mg by mouth daily.    [provider]  cloNIDine (CATAPRES) 0.1 MG tablet Take 0.1 mg by mouth every morning. 11/07/17   [provider]  Dulaglutide (TRULICITY) 1.5 QV/9.5GL SOPN Inject into the skin once a week.    [provider]  ELIQUIS 5 MG TABS tablet TAKE 1 TABLET TWICE A DAY 06/27/17   Nolon Stalls C, MD  ergocalciferol (VITAMIN D2) 50000 UNITS capsule Take 50,000 Units by mouth once a week. Take on Friday mornings    [provider]  ferrous sulfate 325 (65 FE) MG tablet Take 325 mg by mouth 2 (two) times daily with a meal.     [provider]  folic acid (FOLVITE) 875 MCG tablet Take 800 mcg by mouth daily.     [provider]  furosemide (LASIX) 20 MG tablet Take 20 mg by mouth.    [provider]  letrozole [facility]) 2.5 MG tablet TAKE 1 TABLET DAILY 06/27/17   Lequita Asal, MD  levothyroxine (SYNTHROID, LEVOTHROID) 112 MCG tablet Take 112 mcg daily before breakfast by mouth.    [provider]  lisinopril-hydrochlorothiazide (PRINZIDE,ZESTORETIC) 20-25 MG per tablet Take 1 tablet by mouth daily. 07/03/12   [provider]  metFORMIN (GLUCOPHAGE) 1000 MG tablet Take 1 tablet by mouth 2 (two) times daily. 07/03/12   [provider]  ONE TOUCH ULTRA TEST test strip  04/21/15   [provider]  [place]  04/21/15   [provider]  sertraline (ZOLOFT) 50 MG tablet Take 50 mg by mouth daily.    [provider]      PHYSICAL EXAMINATION:   VITAL SIGNS: Blood pressure (!) 159/67, pulse 81, temperature 98.3 F (36.8 C), temperature source Oral, resp. rate 18, height 5\' 3"  (1.6 m), weight (!) 150 kg, last menstrual period 07/17/2012, SpO2 100 %.  GENERAL:  52 y.o.-year-old patient lying in the bed with no acute distress.  EYES: Pupils equal, round, reactive to light and accommodation. No scleral icterus. Extraocular muscles intact.  HEENT: Head atraumatic, normocephalic. Oropharynx and  nasopharynx clear.  NECK:  Supple, no jugular venous distention. No thyroid enlargement, no tenderness.  LUNGS: Normal breath sounds bilaterally, no wheezing, rales,rhonchi or crepitation. No use of accessory muscles of respiration.  CARDIOVASCULAR: S1, S2 normal. No murmurs, rubs, or gallops.  ABDOMEN: Soft, nontender, nondistended. Bowel sounds present. No organomegaly or mass.  EXTREMITIES: No pedal edema, cyanosis, or clubbing.  NEUROLOGIC: Cranial nerves II through XII are intact. Muscle strength 5/5 in all extremities. Sensation intact. Gait not checked.  PSYCHIATRIC: The patient is alert and oriented x 3.  SKIN: No obvious rash, lesion, or ulcer.   LABORATORY PANEL:   CBC Recent Labs  Lab 11/15/17 1500  WBC 15.1*  HGB 10.3*  HCT 32.3*  PLT 232  MCV 92.2  MCH 29.4  MCHC 31.9*  RDW 15.6*  LYMPHSABS 1.7  MONOABS 1.2*  EOSABS 0.3  BASOSABS 0.1   ------------------------------------------------------------------------------------------------------------------  Chemistries  Recent Labs  Lab 11/15/17 1500  NA 129*  K 4.9  CL 95*  CO2 20*  GLUCOSE 121*  BUN 59*  CREATININE 4.54*  CALCIUM 9.4  AST 37  ALT 21  ALKPHOS 115  BILITOT 0.8   ------------------------------------------------------------------------------------------------------------------ estimated creatinine clearance is 20.4 mL/min (A) (by C-G formula based on SCr of 4.54 mg/dL (H)). ------------------------------------------------------------------------------------------------------------------ No results for input(s): TSH, T4TOTAL, T3FREE, THYROIDAB in the last 72 hours.  Invalid input(s): FREET3   Coagulation profile No results for input(s): INR, PROTIME in the last 168 hours. ------------------------------------------------------------------------------------------------------------------- No results for input(s): DDIMER in the last 72  hours. -------------------------------------------------------------------------------------------------------------------  Cardiac Enzymes No results for input(s): CKMB, TROPONINI, MYOGLOBIN in the last 168 hours.  Invalid input(s): CK ------------------------------------------------------------------------------------------------------------------ Invalid input(s): POCBNP  ---------------------------------------------------------------------------------------------------------------  Urinalysis No results found for: COLORURINE, APPEARANCEUR, LABSPEC, PHURINE, GLUCOSEU, HGBUR, BILIRUBINUR, KETONESUR, PROTEINUR, UROBILINOGEN, NITRITE, LEUKOCYTESUR   RADIOLOGY: No results found.  EKG: Orders placed or performed during the hospital encounter of 10/18/14  . EKG 12-Lead  . EKG 12-Lead  . ED EKG  . ED EKG  . EKG  . EKG    IMPRESSION AND PLAN: *Acute renal failure Suspect secondary to medication side effect Admit to regular nursing for bed, strict I&O monitoring, daily weights, IV fluids for rehydration, avoid nephrotoxic agents, hold Lasix/lisinopril/hydrochlorothiazide/metformin/clonidine, check renal ultrasound, nephrology to see, BMP in the morning, and continue close medical monitoring  *Chronic diabetes mellitus type 2 Stable Hold metformin, sliding scale insulin with Accu-Cheks per routine at  *Chronic hypothyroidism, unspecified Stable Continue Synthroid  *Chronic hyperlipidemia, unspecified Stable Continue Lipitor  *History of pulmonary embolism Stable Continue Eliquis  *Acute hyponatremia, hypochloremia Most likely secondary to diuretic side effect Hold diuretics, IV fluids for rehydration, BMP in the morning   All the records are reviewed and case discussed with ED provider. Management plans discussed with the patient, family and they are in agreement.  CODE STATUS:full Code Status History    Date Active Date Inactive Code Status Order ID Comments User  Context   10/18/2014 2221 10/23/2014 2043 Full Code [id number]  Jacqulynn Cadet, MD Inpatient   10/18/2014 2119 10/18/2014 2221 Full Code [id number]  Guy Begin, MD Inpatient    Advance Directive Documentation     Most Recent Value  Type of Advance Directive  Healthcare Power of [place], Living will  Pre-existing out of facility DNR order (yellow form or pink MOST form)  -  "MOST" Form in Place?  -       TOTAL TIME TAKING CARE OF THIS PATIENT: 40 minutes.    Avel Peace Salary M.D on 11/15/2017   Between 7am to 6pm - Pager - [phone]  After 6pm go to [email or web address] - password [email or web address] [facility]  Office  [phone]  CC: Primary care physician; Lenard Simmer, MD   Note: This dictation was prepared with Dragon dictation along with smaller phrase technology. Any transcriptional errors that result from this process are unintentional.

## 2017-11-16 ENCOUNTER — Inpatient Hospital Stay: Payer: BLUE CROSS/BLUE SHIELD

## 2017-11-16 LAB — URINALYSIS, COMPLETE (UACMP) WITH MICROSCOPIC
Bacteria, UA: NONE SEEN
Bilirubin Urine: NEGATIVE
Glucose, UA: 50 mg/dL — AB
Hgb urine dipstick: NEGATIVE
Ketones, ur: NEGATIVE mg/dL
Leukocytes, UA: NEGATIVE
Nitrite: NEGATIVE
Protein, ur: 100 mg/dL — AB
Specific Gravity, Urine: 1.009 (ref 1.005–1.030)
pH: 6 (ref 5.0–8.0)

## 2017-11-16 LAB — GLUCOSE, CAPILLARY
Glucose-Capillary: 79 mg/dL (ref 70–99)
Glucose-Capillary: 96 mg/dL (ref 70–99)
Glucose-Capillary: 73 mg/dL (ref 70–99)
Glucose-Capillary: 77 mg/dL (ref 70–99)
Glucose-Capillary: 83 mg/dL (ref 70–99)

## 2017-11-16 LAB — BASIC METABOLIC PANEL
Anion gap: 14 (ref 5–15)
BUN: 60 mg/dL — ABNORMAL HIGH (ref 6–20)
CO2: 22 mmol/L (ref 22–32)
Calcium: 8.3 mg/dL — ABNORMAL LOW (ref 8.9–10.3)
Chloride: 91 mmol/L — ABNORMAL LOW (ref 98–111)
Creatinine, Ser: 5.42 mg/dL — ABNORMAL HIGH (ref 0.44–1.00)
GFR calc Af Amer: 9 mL/min — ABNORMAL LOW (ref >60)
GFR calc non Af Amer: 8 mL/min — ABNORMAL LOW (ref >60)
Glucose, Bld: 90 mg/dL (ref 70–99)
Potassium: 4.1 mmol/L (ref 3.5–5.1)
Sodium: 127 mmol/L — ABNORMAL LOW (ref 135–145)

## 2017-11-16 LAB — PROTIME-INR
INR: 1.27
Prothrombin Time: 15.8 seconds — ABNORMAL HIGH (ref 11.4–15.2)

## 2017-11-16 LAB — PROTEIN / CREATININE RATIO, URINE
Creatinine, Urine: 36 mg/dL
Protein Creatinine Ratio: 9.56 mg/mg{Cre} — ABNORMAL HIGH (ref 0.00–0.15)
Total Protein, Urine: 344 mg/dL

## 2017-11-16 LAB — CBC
HCT: 28.2 % — ABNORMAL LOW (ref 35.0–47.0)
Hemoglobin: 9.8 g/dL — ABNORMAL LOW (ref 12.0–16.0)
MCH: 32 pg (ref 26.0–34.0)
MCHC: 34.8 g/dL (ref 32.0–36.0)
MCV: 91.9 fL (ref 80.0–100.0)
Platelets: 186 10*3/uL (ref 150–440)
RBC: 3.07 MIL/uL — ABNORMAL LOW (ref 3.80–5.20)
RDW: 15.7 % — ABNORMAL HIGH (ref 11.5–14.5)
WBC: 13.2 10*3/uL — ABNORMAL HIGH (ref 3.6–11.0)

## 2017-11-16 LAB — APTT
aPTT: 72 seconds — ABNORMAL HIGH (ref 24–36)
aPTT: 64 seconds — ABNORMAL HIGH (ref 24–36)

## 2017-11-16 LAB — HEPARIN LEVEL (UNFRACTIONATED): Heparin Unfractionated: 2.44 IU/mL — ABNORMAL HIGH (ref 0.30–0.70)

## 2017-11-16 LAB — CANCER ANTIGEN 27.29: CA 27.29: 20.3 U/mL (ref 0.0–38.6)

## 2017-11-16 MED ORDER — MAGNESIUM SULFATE 2 GM/50ML IV SOLN
2.0000 g | Freq: Once | INTRAVENOUS | Status: AC
  Administered 2017-11-16: 2 g via INTRAVENOUS
  Filled 2017-11-16: qty 50

## 2017-11-16 MED ORDER — HEPARIN BOLUS VIA INFUSION
5000.0000 [IU] | Freq: Once | INTRAVENOUS | Status: AC
  Administered 2017-11-16: 5000 [IU] via INTRAVENOUS
  Filled 2017-11-16: qty 5000

## 2017-11-16 MED ORDER — HEPARIN (PORCINE) IN NACL 100-0.45 UNIT/ML-% IJ SOLN
1800.0000 [IU]/h | INTRAMUSCULAR | Status: DC
  Administered 2017-11-16: 22:00:00 1600 [IU]/h via INTRAVENOUS
  Administered 2017-11-16: 1500 [IU]/h via INTRAVENOUS
  Administered 2017-11-17 – 2017-11-19 (×3): 1600 [IU]/h via INTRAVENOUS
  Filled 2017-11-16 (×6): qty 250

## 2017-11-16 MED ORDER — MONTELUKAST SODIUM 10 MG PO TABS: 10.0000 mg | ORAL_TABLET | Freq: Every day | ORAL | Status: DC

## 2017-11-16 MED ORDER — HEPARIN BOLUS VIA INFUSION
1350.0000 [IU] | Freq: Once | INTRAVENOUS | Status: AC
  Administered 2017-11-16: 1350 [IU] via INTRAVENOUS
  Filled 2017-11-16: qty 1350

## 2017-11-16 MED ORDER — PROMETHAZINE HCL 25 MG/ML IJ SOLN
12.5000 mg | Freq: Four times a day (QID) | INTRAMUSCULAR | Status: DC | PRN
  Administered 2017-11-17: 06:00:00 12.5 mg via INTRAVENOUS
  Filled 2017-11-16: qty 1

## 2017-11-16 MED ORDER — THEOPHYLLINE ER 400 MG PO TB24: 200.0000 mg | ORAL_TABLET | Freq: Every day | ORAL | Status: DC

## 2017-11-16 NOTE — Evaluation (Signed)
Physical Therapy Evaluation Patient Details Name: Nicole Schmidt MRN: [id number] DOB: 13-Mar-1965 Today's Date: 11/16/2017   History of Present Illness  Pt is a 52 y.o. female with a known history that includes anemia, breast CA, arthritis, DM, gout, HTN, PE, renal insufficiency, and thyroid goiter presented as a direct admission from oncology's office for noted acute renal failure, creatinine 4.5 up from 1.3 last check, patient stated that she was recently started on clonidine a week or so ago by her primary care provider, recently increased a few days ago, 2 days ago she noted a severe drop in her urination, complains of 1 week of 6 pound weight gain, complains of generalized weakness, fatigue, lack of strength and energy, blood work noted for sodium 129, chloride 95, creatinine 4.5, white count 15,000, patient denied any pain, patient admitted for acute renal failure most likely secondary to medication side effect with hyponatremia secondary to dehydration.       Clinical Impression  Pt presents with mild deficits in strength, mobility, gait, and activity tolerance but overall performed very well during the session.  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  Pt had just recently returned to work prior to this admission and reports feeling somewhat weaker than her baseline level.  Pt required min A with bed mobility tasks but was steady with transfers and was able to amb 200 feet with a RW and SBA with mildly reduced cadence but was steady without LOB.  Pt's SpO2 and HR were WNL pre and post ambulation.  Will keep patient on acute care PT schedule to prevent functional decline and to address the above deficits but will recommend no follow up PT services upon discharge from acute care.  Pt also encouraged to request ambulation daily with nursing staff as tolerated with patient in agreement of  this plan.         No PT follow up    Equipment Recommendations  None recommended by PT    Recommendations for Other Services       Precautions / Restrictions Precautions Precautions: None Restrictions Weight Bearing Restrictions: No      Mobility  Bed Mobility Overal bed mobility: Needs Assistance Bed Mobility: Supine to Sit;Sit to Supine     Supine to sit: Min assist Sit to supine: Modified independent (Device/Increase time)   General bed mobility comments: Min A to come to full upright sitting position during sup to sit; pt education provided on log roll technique with pt requiring decreased assistance using this method  Transfers Overall transfer level: Needs assistance Equipment used: Rolling walker (2 wheeled) Transfers: Sit to/from Stand Sit to Stand: Supervision         General transfer comment: Good control and stability during transfers  Ambulation/Gait Ambulation/Gait assistance: Supervision Gait Distance (Feet): 200 Feet Assistive device: Rolling walker (2 wheeled) Gait Pattern/deviations: Step-through pattern;Decreased step length - right;Decreased step length - left Gait velocity: Decreased   General Gait Details: Mildly reduced cadence with amb with good stability and with SpO2 and HR WNL throughout.  Stairs            Wheelchair Mobility    Modified Rankin (Stroke Patients Only)       Balance Overall balance assessment: No apparent balance deficits (not formally assessed)  Pertinent Vitals/Pain Pain Assessment: 0-10 Pain Score: 8  Pain Location: B feet from recently going back to work where she stands on concrete all day Pain Descriptors / Indicators: Sore Pain Intervention(s): Premedicated before session;Monitored during session    [place] expects to be discharged to:: Private residence Living Arrangements: Alone Available Help at Discharge:  Other (Comment)(No assistance available) Type of Home: House Home Access: Ramped entrance     Home Layout: One level Home Equipment: Kulm - 2 wheels;Cane - single point;Wheelchair - manual      Prior Function Level of Independence: Independent         Comments: Pt recently back to work at a job where she stands all day, Ind amb without an AD, Ind with ADLs, no fall history     Hand Dominance   Dominant Hand: Right    Extremity/Trunk Assessment   Upper Extremity Assessment Upper Extremity Assessment: Overall WFL for tasks assessed    Lower Extremity Assessment Lower Extremity Assessment: Overall WFL for tasks assessed       Communication   Communication: No difficulties  Cognition Arousal/Alertness: Awake/alert Behavior During Therapy: WFL for tasks assessed/performed Overall Cognitive Status: Within Functional Limits for tasks assessed                                        General Comments      Exercises Total Joint Exercises Ankle Circles/Pumps: AROM;Both;10 reps Hip ABduction/ADduction: AROM;Both;5 reps Straight Leg Raises: AROM;Both;5 reps Long Arc Quad: Strengthening;Both;10 reps Knee Flexion: Strengthening;Both;10 reps Marching in Standing: AROM;Both;10 reps;Seated;Standing Other Exercises Other Exercises: Education provided on physiological benefits of activity and principles of activity progression    Assessment/Plan    PT Assessment Patient needs continued PT services  PT Problem List Decreased strength;Decreased activity tolerance;Decreased mobility       PT Treatment Interventions Gait training;Functional mobility training;DME instruction;Therapeutic activities;Balance training;Patient/family education;Therapeutic exercise    PT Goals (Current goals can be found in the Care Plan section)  Acute Rehab PT Goals Patient Stated Goal: To increase strength and return home PT Goal Formulation: With patient Time For Goal  Achievement: 11/29/17 Potential to Achieve Goals: Good    Frequency Min 2X/week   Barriers to discharge        Co-evaluation               AM-PAC PT "6 Clicks" Daily Activity  Outcome Measure Difficulty turning over in bed (including adjusting bedclothes, sheets and blankets)?: Unable Difficulty moving from lying on back to sitting on the side of the bed? : Unable Difficulty sitting down on and standing up from a chair with arms (e.g., wheelchair, bedside commode, etc,.)?: None Help needed moving to and from a bed to chair (including a wheelchair)?: None Help needed walking in hospital room?: None Help needed climbing 3-5 steps with a railing? : A Little 6 Click Score: 17    End of Session Equipment Utilized During Treatment: Gait belt Activity Tolerance: Patient tolerated treatment well Patient left: in bed;with bed alarm set;with call bell/phone within reach Nurse Communication: Mobility status PT Visit Diagnosis: Muscle weakness (generalized) (M62.81)    Time: 0814-4818 PT Time Calculation (min) (ACUTE ONLY): 34 min   Charges:   PT Evaluation $PT Eval Low Complexity: 1 Low PT Treatments $Therapeutic Exercise: 8-22 mins       D. Royetta Asal PT, DPT 11/16/17, 3:57 PM

## 2017-11-16 NOTE — Progress Notes (Signed)
[facility]  ROUNDING NOTE   Subjective:  Patient known to [name] previously from an episode of acute renal failure that we evaluated in the office. Patient presented now with acute renal failure after visit to hematology. During her oncology visit creatinine was found to be 4.54. Creatinine is risen to 5.42 today. Renal ultrasound was performed and negative for hydronephrosis or obstruction. Serum sodium low at 127. Patient was on both HCTZ as well as furosemide. Patient started work earlier this week and she reports that she was perspiring quite a bit at work on Wednesday. Denies any NSAIDs.   Objective:  Vital signs in last 24 hours:  Temp:  [97.4 F (36.3 C)-98.4 F (36.9 C)] 97.8 F (36.6 C) (10/04 1235) Pulse Rate:  [69-85] 69 (10/04 1235) Resp:  [16-20] 20 (10/04 1235) BP: (149-173)/(60-77) 169/71 (10/04 1235) SpO2:  [98 %-100 %] 98 % (10/04 1235) Weight:  [150 kg-152.5 kg] 150 kg (10/03 1810)  Weight change:  Filed Weights   11/15/17 1810  Weight: (!) 150 kg    Intake/Output: I/O last 3 completed shifts: In: 578.2 [I.V.:578.2] Out: 200 [Urine:200]   Intake/Output this shift:  Total I/O In: 480 [P.O.:480] Out: 1100 [Emesis/NG output:1100]  Physical Exam: General: No acute distress  Head: Normocephalic, atraumatic. Moist oral mucosal membranes  Eyes: Anicteric  Neck: Supple, trachea midline  Lungs:  Clear to auscultation, normal effort  Heart: S1S2 no rubs  Abdomen:  Soft, nontender, bowel sounds present  Extremities: 1+ peripheral edema.  Neurologic: Awake, alert, following commands  Skin: No lesions       Basic Metabolic Panel: Recent Labs  Lab 11/15/17 1500 11/16/17 0405  NA 129* 127*  K 4.9 4.1  CL 95* 91*  CO2 20* 22  GLUCOSE 121* 90  BUN 59* 60*  CREATININE 4.54* 5.42*  CALCIUM 9.4 8.3*    Liver Function Tests: Recent Labs  Lab 11/15/17 1500  AST 37  ALT 21  ALKPHOS 115  BILITOT 0.8  PROT 7.1  ALBUMIN 3.9   No  results for input(s): LIPASE, AMYLASE in the last 168 hours. No results for input(s): AMMONIA in the last 168 hours.  CBC: Recent Labs  Lab 11/15/17 1500 11/16/17 0405  WBC 15.1* 13.2*  NEUTROABS 11.9*  --   HGB 10.3* 9.8*  HCT 32.3* 28.2*  MCV 92.2 91.9  PLT 232 186    Cardiac Enzymes: No results for input(s): CKTOTAL, CKMB, CKMBINDEX, TROPONINI in the last 168 hours.  BNP: Invalid input(s): POCBNP  CBG: Recent Labs  Lab 11/15/17 2105 11/16/17 0754 11/16/17 1143  GLUCAP 80 79 96    Microbiology: Results for orders placed or performed during the hospital encounter of 10/18/14  MRSA PCR Screening     Status: None   Collection Time: 10/18/14  8:15 PM  Result Value Ref Range Status   MRSA by PCR NEGATIVE NEGATIVE Final    Comment:        The GeneXpert MRSA Assay (FDA approved for NASAL specimens only), is one component of a comprehensive MRSA colonization surveillance program. It is not intended to diagnose MRSA infection nor to guide or monitor treatment for MRSA infections.     Coagulation Studies: Recent Labs    11/16/17 1038  LABPROT 15.8*  INR 1.27    Urinalysis: Recent Labs    11/15/17 0529  COLORURINE YELLOW*  LABSPEC 1.009  PHURINE 6.0  GLUCOSEU 50*  HGBUR NEGATIVE  BILIRUBINUR NEGATIVE  KETONESUR NEGATIVE  PROTEINUR 100*  NITRITE NEGATIVE  LEUKOCYTESUR NEGATIVE      Imaging: US Renal  Result Date: 11/16/2017 CLINICAL DATA:  Acute renal failure EXAM: RENAL / URINARY TRACT ULTRASOUND COMPLETE COMPARISON:  Ultrasound 10/20/2014 FINDINGS: Right Kidney: Length: 11.9 cm. Echogenicity within normal limits. No mass or hydronephrosis visualized. Left Kidney: Length: 13.4 cm. Echogenicity within normal limits. No mass or hydronephrosis visualized. Bladder: Appears normal for degree of bladder distention. IMPRESSION: No acute findings.  No hydronephrosis. Electronically Signed   By: Rolm Baptise M.D.   On: 11/16/2017 09:48     Medications:    . sodium chloride 125 mL/hr at 11/16/17 0536  . heparin 1,500 Units/hr (11/16/17 0900)   . atorvastatin  20 mg Oral Daily  . ferrous sulfate  325 mg Oral BID WC  . folic acid  5,170 mcg Oral Daily  . insulin aspart  0-15 Units Subcutaneous TID WC  . insulin aspart  0-5 Units Subcutaneous QHS  . letrozole  2.5 mg Oral Daily  . levothyroxine  112 mcg Oral QAC breakfast  . sertraline  50 mg Oral Daily  . vitamin C  500 mg Oral BID   acetaminophen **OR** acetaminophen, ALPRAZolam, hydrALAZINE, HYDROcodone-acetaminophen, ondansetron **OR** ondansetron (ZOFRAN) IV, polyethylene glycol  Assessment/ Plan:  52 y.o. female breast cancer, diabetes mellitus type 2, gout, hypertension, pulmonary embolism, prior episode of acute renal failure who was admitted now for acute renal failure.  1.  Acute renal failure. 2.  Hyponatremia. 3.  Anemia unspecified. 4.  Lower extremity edema.  Plan:  We were asked to see the patient for evaluation management of acute renal failure.  Recently she had changes to her blood pressure medication regimen and was started on clonidine.  In addition she was on 2 diuretics in the form of HCTZ as well as furosemide.  On Wednesday of this past week she restarted work after being out on medical leave.  She reports that she perspired quite a bit.  Suspect that she became volume depleted earlier in the week which was rather prolonged in nature which led to her acute renal failure.  She denies taking NSAIDs at home.  Continue IV fluid hydration for now.  We did advise the patient that we may need to consider temporary hemodialysis should renal function continued to deteriorate.  She verbalized understanding of this.  We will proceed with additional work-up including SPEP, UPEP, ANA, ANCA antibodies, GBM antibodies, C3, C4.  Thanks for consultation.   LOS: 1 Fredick Schlosser 10/4/201912:45 PM

## 2017-11-16 NOTE — Progress Notes (Addendum)
ANTICOAGULATION CONSULT NOTE - Initial Consult  Pharmacy Consult for heparin Indication: apixaban PTA but admitted with AKI so switching AC to UFH (hx PE 10/2014 but no acute indication noted)  No Known Allergies  Patient Measurements: Height: 5\' 3"  (160 cm) Weight: (!) 330 lb 11 oz (150 kg) IBW/kg (Calculated) : 52.4 Heparin Dosing Weight: 90.9 kg  Vital Signs: Temp: 98 F (36.7 C) (10/04 0336) Temp Source: Oral (10/04 0336) BP: 165/63 (10/04 0336) Pulse Rate: 72 (10/04 0336)  Labs: Recent Labs    11/15/17 1500 11/16/17 0405  HGB 10.3* 9.8*  HCT 32.3* 28.2*  PLT 232 186  CREATININE 4.54* 5.42*    Estimated Creatinine Clearance: 17.1 mL/min (A) (by C-G formula based on SCr of 5.42 mg/dL (H)).   Medical History: Past Medical History:  Diagnosis Date  . Anemia   . Arthritis   . Breast cancer of upper-inner quadrant of right female breast ([place])    Right breast invasive CA and DCIS , T1,N0,M0. Er/PR pos, her 2 negative  . Diabetes mellitus without complication ([place])   . Gout   . Gout   . Hypertension   . Menopause    age 28  . Personal history of radiation therapy   . Pulmonary embolism ([place]) 10/2014  . Renal insufficiency   . Thyroid goiter     Medications:  Infusions:  . sodium chloride 125 mL/hr at 11/16/17 0536  . heparin      Assessment: 52 yof direct admit from oncology clinic for increased serum creatinine. Patient takes apixaban PTA for history of PE in 2016. Due to AKI we will transition to heparin infusion while patient is worked up for AKI.   Goal of Therapy:  Heparin level 0.3-0.7 units/ml aPTT 68 to 109 seconds Monitor platelets by anticoagulation protocol: Yes   Plan:  10/04 @ 1657 aPPT 64 which is slightly subtherapeutic.  Will bolus with 1350 units and then increase rate to 1600 units/hr Check aPTT in 6 hours, monitor HL daily until HL and aPTT correlate per protocol Continue to monitor H&H and platelets  Evelena Asa,  PharmD Clinical Pharamcist 11/16/2017,6:08 PM

## 2017-11-16 NOTE — Progress Notes (Signed)
Patient ID: Nicole Schmidt, female   DOB: 12/21/1965, 52 y.o.   MRN: [id number]  [employer] [facility] PROGRESS NOTE  Nicole Schmidt DSK:[id number] DOB: 1965/05/01 DOA: 11/15/2017 PCP: Lenard Simmer, MD  HPI/Subjective: Patient with a headache.  Some nausea vomiting.  Always has some diarrhea.  No abdominal pain.  Not making much urine.  Objective: Vitals:   11/16/17 0800 11/16/17 1235  BP: (!) 149/60 (!) 169/71  Pulse: 71 69  Resp: 16 20  Temp: 97.9 F (36.6 C) 97.8 F (36.6 C)  SpO2: 99% 98%    Filed Weights   11/15/17 1810  Weight: (!) 150 kg    ROS: Review of Systems  Constitutional: Positive for malaise/fatigue. Negative for chills and fever.  Eyes: Negative for blurred vision.  Respiratory: Negative for cough and shortness of breath.   Cardiovascular: Negative for chest pain.  Gastrointestinal: Negative for abdominal pain, constipation, diarrhea, nausea and vomiting.  Genitourinary: Negative for dysuria.  Musculoskeletal: Negative for joint pain.  Neurological: Positive for headaches. Negative for dizziness.   Exam: Physical Exam  Constitutional: She is oriented to person, place, and time.  HENT:  Nose: No mucosal edema.  Mouth/Throat: No oropharyngeal exudate or posterior oropharyngeal edema.  Eyes: Pupils are equal, round, and reactive to light. Conjunctivae, EOM and lids are normal.  Neck: No JVD present. Carotid bruit is not present. No edema present. No thyroid mass and no thyromegaly present.  Cardiovascular: S1 normal and S2 normal. Exam reveals no gallop.  No murmur heard. Pulses:      Dorsalis pedis pulses are 2+ on the right side, and 2+ on the left side.  Respiratory: No respiratory distress. She has decreased breath sounds in the right lower field and the left lower field. She has no wheezes. She has no rhonchi. She has no rales.  GI: Soft. Bowel sounds are normal. There is no tenderness.  Musculoskeletal:       Right ankle: She exhibits swelling.     Left ankle: She exhibits swelling.  Lymphadenopathy:    She has no cervical adenopathy.  Neurological: She is alert and oriented to person, place, and time. No cranial nerve deficit.  Skin: Skin is warm. No rash noted. Nails show no clubbing.  Psychiatric: She has a normal mood and affect.      Data Reviewed: Basic Metabolic Panel: Recent Labs  Lab 11/15/17 1500 11/16/17 0405  NA 129* 127*  K 4.9 4.1  CL 95* 91*  CO2 20* 22  GLUCOSE 121* 90  BUN 59* 60*  CREATININE 4.54* 5.42*  CALCIUM 9.4 8.3*   Liver Function Tests: Recent Labs  Lab 11/15/17 1500  AST 37  ALT 21  ALKPHOS 115  BILITOT 0.8  PROT 7.1  ALBUMIN 3.9   CBC: Recent Labs  Lab 11/15/17 1500 11/16/17 0405  WBC 15.1* 13.2*  NEUTROABS 11.9*  --   HGB 10.3* 9.8*  HCT 32.3* 28.2*  MCV 92.2 91.9  PLT 232 186    CBG: Recent Labs  Lab 11/15/17 2105 11/16/17 0754 11/16/17 1143  GLUCAP 80 79 96     Studies: US Renal  Result Date: 11/16/2017 CLINICAL DATA:  Acute renal failure EXAM: RENAL / URINARY TRACT ULTRASOUND COMPLETE COMPARISON:  Ultrasound 10/20/2014 FINDINGS: Right Kidney: Length: 11.9 cm. Echogenicity within normal limits. No mass or hydronephrosis visualized. Left Kidney: Length: 13.4 cm. Echogenicity within normal limits. No mass or hydronephrosis visualized. Bladder: Appears normal for degree of bladder distention. IMPRESSION: No acute findings.  No  hydronephrosis. Electronically Signed   By: Rolm Baptise M.D.   On: 11/16/2017 09:48    Scheduled Meds: . atorvastatin  20 mg Oral Daily  . ferrous sulfate  325 mg Oral BID WC  . folic acid  4,742 mcg Oral Daily  . insulin aspart  0-15 Units Subcutaneous TID WC  . insulin aspart  0-5 Units Subcutaneous QHS  . letrozole  2.5 mg Oral Daily  . levothyroxine  112 mcg Oral QAC breakfast  . sertraline  50 mg Oral Daily  . vitamin C  500 mg Oral BID   Continuous Infusions: . sodium chloride 125 mL/hr at 11/16/17 0536  . heparin 1,500  Units/hr (11/16/17 0900)  . magnesium sulfate 1 - 4 g bolus IVPB      Assessment/Plan:  1. Acute kidney injury, nausea vomiting.  Creatinine worsened overnight despite IV fluids.  Not making much urine.  Case discussed with nephrology and they would like to do IV fluids today.  Patient may end up needing temporary dialysis depending on kidney function and urine output.  PRN nausea medications.  Holding Lasix, lisinopril HCT, metformin and clonidine.  Renal ultrasound reviewed. 2. History of breast cancer on letrozole 3. History of pulmonary embolism on Eliquis as outpatient.  Because of renal function will have to switch to heparin drip at this point. 4. Hyponatremia secondary to dehydration 5. Depression on Zoloft 6. Hypothyroidism unspecified on levothyroxine 7. Hyperlipidemia unspecified on atorvastatin 8. Morbid obesity with a BMI of 58.58.  Weight loss needed  Code Status:     Code Status Orders  (From admission, onward)         Start     Ordered   11/15/17 2056  Full code  Continuous     11/15/17 2055        Code Status History    Date Active Date Inactive Code Status Order ID Comments User Context   10/18/2014 2221 10/23/2014 2043 Full Code [id number]  Jacqulynn Cadet, MD Inpatient   10/18/2014 2119 10/18/2014 2221 Full Code [id number]  Guy Begin, MD Inpatient    Advance Directive Documentation     Most Recent Value  Type of Advance Directive  Healthcare Power of Attorney, Living will  Pre-existing out of facility DNR order (yellow form or pink MOST form)  -  "MOST" Form in Place?  -     Family Communication: Family at bedside Disposition Plan: Kidney function will need to be improved prior to disposition  Consultants:  Nephrology  Time spent: 28 minutes  [place]

## 2017-11-17 DIAGNOSIS — Z79811 Long term (current) use of aromatase inhibitors: Secondary | ICD-10-CM | POA: Insufficient documentation

## 2017-11-17 DIAGNOSIS — N289 Disorder of kidney and ureter, unspecified: Secondary | ICD-10-CM | POA: Insufficient documentation

## 2017-11-17 LAB — BASIC METABOLIC PANEL
Anion gap: 13 (ref 5–15)
BUN: 62 mg/dL — ABNORMAL HIGH (ref 6–20)
CO2: 23 mmol/L (ref 22–32)
Calcium: 8.4 mg/dL — ABNORMAL LOW (ref 8.9–10.3)
Chloride: 92 mmol/L — ABNORMAL LOW (ref 98–111)
Creatinine, Ser: 6.3 mg/dL — ABNORMAL HIGH (ref 0.44–1.00)
GFR calc Af Amer: 8 mL/min — ABNORMAL LOW (ref >60)
GFR calc non Af Amer: 7 mL/min — ABNORMAL LOW (ref >60)
Glucose, Bld: 100 mg/dL — ABNORMAL HIGH (ref 70–99)
Potassium: 5.4 mmol/L — ABNORMAL HIGH (ref 3.5–5.1)
Sodium: 128 mmol/L — ABNORMAL LOW (ref 135–145)

## 2017-11-17 LAB — APTT: aPTT: 87 seconds — ABNORMAL HIGH (ref 24–36)

## 2017-11-17 LAB — CBC
HCT: 33 % — ABNORMAL LOW (ref 35.0–47.0)
Hemoglobin: 10.5 g/dL — ABNORMAL LOW (ref 12.0–16.0)
MCH: 29.4 pg (ref 26.0–34.0)
MCHC: 31.8 g/dL — ABNORMAL LOW (ref 32.0–36.0)
MCV: 92.4 fL (ref 80.0–100.0)
Platelets: 198 10*3/uL (ref 150–440)
RBC: 3.57 MIL/uL — ABNORMAL LOW (ref 3.80–5.20)
RDW: 16 % — ABNORMAL HIGH (ref 11.5–14.5)
WBC: 11.2 10*3/uL — ABNORMAL HIGH (ref 3.6–11.0)

## 2017-11-17 LAB — GLUCOSE, CAPILLARY
Glucose-Capillary: 76 mg/dL (ref 70–99)
Glucose-Capillary: 93 mg/dL (ref 70–99)
Glucose-Capillary: 102 mg/dL — ABNORMAL HIGH (ref 70–99)
Glucose-Capillary: 93 mg/dL (ref 70–99)

## 2017-11-17 LAB — ANA W/REFLEX IF POSITIVE: Anti Nuclear Antibody(ANA): NEGATIVE

## 2017-11-17 LAB — C4 COMPLEMENT: Complement C4, Body Fluid: 36 mg/dL (ref 14–44)

## 2017-11-17 LAB — MPO/PR-3 (ANCA) ANTIBODIES
ANCA Proteinase 3: <3.5 U/mL (ref 0.0–3.5)
Myeloperoxidase Abs: <9 U/mL (ref 0.0–9.0)

## 2017-11-17 LAB — HIV ANTIBODY (ROUTINE TESTING W REFLEX): HIV Screen 4th Generation wRfx: NONREACTIVE

## 2017-11-17 LAB — C3 COMPLEMENT: C3 Complement: 103 mg/dL (ref 82–167)

## 2017-11-17 MED ORDER — CHLORHEXIDINE GLUCONATE CLOTH 2 % EX PADS
6.0000 | MEDICATED_PAD | Freq: Every day | CUTANEOUS | Status: DC
  Administered 2017-11-17 – 2017-11-21 (×5): 6 via TOPICAL

## 2017-11-17 NOTE — Progress Notes (Signed)
Patient ID: Nicole Schmidt, female   DOB: [birth year], 52 y.o.   MRN: [id number]  [employer] PROGRESS NOTE  Nicole Schmidt DOB: 1965-06-22 DOA: 11/15/2017 PCP: Nicole Simmer, MD  HPI/Subjective: Patient states that she started making more urine today.  Continues to be nauseous Objective: Vitals:   11/16/17 1919 11/17/17 0412  BP: (!) 139/36 (!) 146/56  Pulse: 69 78  Resp: 16 17  Temp: 98.2 F (36.8 C) 98 F (36.7 C)  SpO2: 98% 98%    Filed Weights   11/15/17 1810 11/17/17 0412  Weight: (!) 150 kg (!) 156.8 kg    ROS: Review of Systems  Constitutional: Positive for malaise/fatigue. Negative for chills and fever.  Eyes: Negative for blurred vision.  Respiratory: Negative for cough and shortness of breath.   Cardiovascular: Negative for chest pain.  Gastrointestinal: Positive for nausea. Negative for abdominal pain, constipation, diarrhea and vomiting.  Genitourinary: Negative for dysuria.  Musculoskeletal: Negative for joint pain.  Neurological: Positive for headaches. Negative for dizziness.   Exam: Physical Exam  Constitutional: She is oriented to person, place, and time.  HENT:  Nose: No mucosal edema.  Mouth/Throat: No oropharyngeal exudate or posterior oropharyngeal edema.  Eyes: Pupils are equal, round, and reactive to light. Conjunctivae, EOM and lids are normal.  Neck: No JVD present. Carotid bruit is not present. No edema present. No thyroid mass and no thyromegaly present.  Cardiovascular: S1 normal and S2 normal. Exam reveals no gallop.  No murmur heard. Pulses:      Dorsalis pedis pulses are 2+ on the right side, and 2+ on the left side.  Respiratory: No respiratory distress. She has decreased breath sounds in the right lower field and the left lower field. She has no wheezes. She has no rhonchi. She has no rales.  GI: Soft. Bowel sounds are normal. There is no tenderness.  Musculoskeletal:       Right ankle: She exhibits swelling.     Left ankle: She exhibits swelling.  Lymphadenopathy:    She has no cervical adenopathy.  Neurological: She is alert and oriented to person, place, and time. No cranial nerve deficit.  Skin: Skin is warm. No rash noted. Nails show no clubbing.  Psychiatric: She has a normal mood and affect.      Data Reviewed: Basic Metabolic Panel: Recent Labs  Lab 11/15/17 1500 11/16/17 0405 11/17/17 0000  NA 129* 127* 128*  K 4.9 4.1 5.4*  CL 95* 91* 92*  CO2 20* 22 23  GLUCOSE 121* 90 100*  BUN 59* 60* 62*  CREATININE 4.54* 5.42* 6.30*  CALCIUM 9.4 8.3* 8.4*   Liver Function Tests: Recent Labs  Lab 11/15/17 1500  AST 37  ALT 21  ALKPHOS 115  BILITOT 0.8  PROT 7.1  ALBUMIN 3.9   CBC: Recent Labs  Lab 11/15/17 1500 11/16/17 0405 11/17/17 0000  WBC 15.1* 13.2* 11.2*  NEUTROABS 11.9*  --   --   HGB 10.3* 9.8* 10.5*  HCT 32.3* 28.2* 33.0*  MCV 92.2 91.9 92.4  PLT 232 186 198    CBG: Recent Labs  Lab 11/16/17 1143 11/16/17 1643 11/16/17 2100 11/16/17 2209 11/17/17 0737  GLUCAP 96 73 77 83 76     Studies: US Renal  Result Date: 11/16/2017 CLINICAL DATA:  Acute renal failure EXAM: RENAL / URINARY TRACT ULTRASOUND COMPLETE COMPARISON:  Ultrasound 10/20/2014 FINDINGS: Right Kidney: Length: 11.9 cm. Echogenicity within normal limits. No mass or hydronephrosis visualized. Left Kidney: Length: 13.4  cm. Echogenicity within normal limits. No mass or hydronephrosis visualized. Bladder: Appears normal for degree of bladder distention. IMPRESSION: No acute findings.  No hydronephrosis. Electronically Signed   By: Nicole Schmidt M.D.   On: 11/16/2017 09:48    Scheduled Meds: . atorvastatin  20 mg Oral Daily  . Chlorhexidine Gluconate Cloth  6 each Topical Q0600  . ferrous sulfate  325 mg Oral BID WC  . folic acid  1,607 mcg Oral Daily  . insulin aspart  0-15 Units Subcutaneous TID WC  . insulin aspart  0-5 Units Subcutaneous QHS  . letrozole  2.5 mg Oral Daily  .  levothyroxine  112 mcg Oral QAC breakfast  . sertraline  50 mg Oral Daily  . vitamin C  500 mg Oral BID   Continuous Infusions: . sodium chloride 125 mL/hr at 11/17/17 0614  . heparin 1,600 Units/hr (11/17/17 3710)    Assessment/Plan:  1. Acute kidney injury, nausea vomiting.  Seen by nephrology due to worsening potassium sodium continues to be low plan for hemodialysis today 2. History of breast cancer on letrozole 3. History of pulmonary embolism on Eliquis as outpatient.  Continue IV heparin for now 4. Hyponatremia secondary to dehydration 5. Depression on Zoloft 6. Hypothyroidism unspecified on levothyroxine 7. Hyperlipidemia unspecified on atorvastatin 8. Morbid obesity with a BMI of 58.58.  Weight loss needed  Code Status:     Code Status Orders  (From admission, onward)         Start     Ordered   11/15/17 2056  Full code  Continuous     11/15/17 2055        Code Status History    Date Active Date Inactive Code Status Order ID Comments User Context   10/18/2014 2221 10/23/2014 2043 Full Code [id number]  Jacqulynn Cadet, MD Inpatient   10/18/2014 2119 10/18/2014 2221 Full Code [id number]  Guy Begin, MD Inpatient    Advance Directive Documentation     Most Recent Value  Type of Advance Directive  Healthcare Power of Attorney, Living will  Pre-existing out of facility DNR order (yellow form or pink MOST form)  -  "MOST" Form in Place?  -     Family Communication: Family at bedside Disposition Plan: Kidney function will need to be improved prior to disposition  Consultants:  Nephrology  Time spent: 28 minutes  Nicole Schmidt

## 2017-11-17 NOTE — Progress Notes (Signed)
ANTICOAGULATION CONSULT NOTE - Initial Consult  Pharmacy Consult for heparin Indication: apixaban PTA but admitted with AKI so switching AC to UFH (hx PE 10/2014 but no acute indication noted)  No Known Allergies  Patient Measurements: Height: 5\' 3"  (160 cm) Weight: (!) 330 lb 11 oz (150 kg) IBW/kg (Calculated) : 52.4 Heparin Dosing Weight: 90.9 kg  Vital Signs: Temp: 98.2 F (36.8 C) (10/04 1919) Temp Source: Oral (10/04 1919) BP: 139/36 (10/04 1919) Pulse Rate: 69 (10/04 1919)  Labs: Recent Labs    11/15/17 1500 11/16/17 0405 11/16/17 1038 11/16/17 1224 11/16/17 1657 11/17/17 0000  HGB 10.3* 9.8*  --   --   --  10.5*  HCT 32.3* 28.2*  --   --   --  33.0*  PLT 232 186  --   --   --  198  APTT  --   --   --  72* 64* 87*  LABPROT  --   --  15.8*  --   --   --   INR  --   --  1.27  --   --   --   HEPARINUNFRC  --   --  2.44*  --   --   --   CREATININE 4.54* 5.42*  --   --   --  6.30*    Estimated Creatinine Clearance: 14.7 mL/min (A) (by C-G formula based on SCr of 6.3 mg/dL (H)).   Medical History: Past Medical History:  Diagnosis Date  . Anemia   . Arthritis   . Breast cancer of upper-inner quadrant of right female breast ([place])    Right breast invasive CA and DCIS , T1,N0,M0. Er/PR pos, her 2 negative  . Diabetes mellitus without complication ([place])   . Gout   . Gout   . Hypertension   . Menopause    age 56  . Personal history of radiation therapy   . Pulmonary embolism ([place]) 10/2014  . Renal insufficiency   . Thyroid goiter     Medications:  Infusions:  . sodium chloride 125 mL/hr at 11/16/17 2159  . heparin 1,600 Units/hr (11/16/17 2159)    Assessment: 8 yof direct admit from oncology clinic for increased serum creatinine. Patient takes apixaban PTA for history of PE in 2016. Due to AKI we will transition to heparin infusion while patient is worked up for AKI.   Goal of Therapy:  Heparin level 0.3-0.7 units/ml aPTT 68 to 109 seconds Monitor  platelets by anticoagulation protocol: Yes   Plan:  10/04 @ 1657 aPPT 64 which is slightly subtherapeutic.  Will bolus with 1350 units and then increase rate to 1600 units/hr Check aPTT in 6 hours, monitor HL daily until HL and aPTT correlate per protocol Continue to monitor H&H and platelets  10/05 0000 aPTT 87. Continue current regimen  Recheck aPTT, heparin level, and CBC with 10/6 AM labs.  Sim Boast, PharmD, BCPS  11/17/17 1:06 AM

## 2017-11-17 NOTE — Progress Notes (Signed)
[facility]  ROUNDING NOTE   Subjective:  Renal function worse this a.m. Creatinine up to 6.3 and potassium up to 5.4. We talked about initiation of renal placement therapy today.   Objective:  Vital signs in last 24 hours:  Temp:  [98 F (36.7 C)-98.2 F (36.8 C)] 98 F (36.7 C) (10/05 0412) Pulse Rate:  [69-78] 78 (10/05 0412) Resp:  [16-17] 17 (10/05 0412) BP: (139-146)/(36-56) 146/56 (10/05 0412) SpO2:  [98 %] 98 % (10/05 0412) Weight:  [156.8 kg] 156.8 kg (10/05 0412)  Weight change: 6.808 kg Filed Weights   11/15/17 1810 11/17/17 0412  Weight: (!) 150 kg (!) 156.8 kg    Intake/Output: I/O last 3 completed shifts: In: 4623.4 [P.O.:480; I.V.:4143.4] Out: 1900 [Urine:500; Emesis/NG output:1400]   Intake/Output this shift:  Total I/O In: 240 [P.O.:240] Out: -   Physical Exam: General: No acute distress  Head: Normocephalic, atraumatic. Moist oral mucosal membranes  Eyes: Anicteric  Neck: Supple, trachea midline  Lungs:  Clear to auscultation, normal effort  Heart: S1S2 no rubs  Abdomen:  Soft, nontender, bowel sounds present  Extremities: 1+ peripheral edema.  Neurologic: Awake, alert, following commands  Skin: No lesions       Basic Metabolic Panel: Recent Labs  Lab 11/15/17 1500 11/16/17 0405 11/17/17 0000  NA 129* 127* 128*  K 4.9 4.1 5.4*  CL 95* 91* 92*  CO2 20* 22 23  GLUCOSE 121* 90 100*  BUN 59* 60* 62*  CREATININE 4.54* 5.42* 6.30*  CALCIUM 9.4 8.3* 8.4*    Liver Function Tests: Recent Labs  Lab 11/15/17 1500  AST 37  ALT 21  ALKPHOS 115  BILITOT 0.8  PROT 7.1  ALBUMIN 3.9   No results for input(s): LIPASE, AMYLASE in the last 168 hours. No results for input(s): AMMONIA in the last 168 hours.  CBC: Recent Labs  Lab 11/15/17 1500 11/16/17 0405 11/17/17 0000  WBC 15.1* 13.2* 11.2*  NEUTROABS 11.9*  --   --   HGB 10.3* 9.8* 10.5*  HCT 32.3* 28.2* 33.0*  MCV 92.2 91.9 92.4  PLT 232 186 198    Cardiac  Enzymes: No results for input(s): CKTOTAL, CKMB, CKMBINDEX, TROPONINI in the last 168 hours.  BNP: Invalid input(s): POCBNP  CBG: Recent Labs  Lab 11/16/17 1143 11/16/17 1643 11/16/17 2100 11/16/17 2209 11/17/17 0737  GLUCAP 96 73 77 83 76    Microbiology: Results for orders placed or performed during the hospital encounter of 10/18/14  MRSA PCR Screening     Status: None   Collection Time: 10/18/14  8:15 PM  Result Value Ref Range Status   MRSA by PCR NEGATIVE NEGATIVE Final    Comment:        The GeneXpert MRSA Assay (FDA approved for NASAL specimens only), is one component of a comprehensive MRSA colonization surveillance program. It is not intended to diagnose MRSA infection nor to guide or monitor treatment for MRSA infections.     Coagulation Studies: Recent Labs    11/16/17 1038  LABPROT 15.8*  INR 1.27    Urinalysis: Recent Labs    11/15/17 0529  COLORURINE YELLOW*  LABSPEC 1.009  PHURINE 6.0  GLUCOSEU 50*  HGBUR NEGATIVE  BILIRUBINUR NEGATIVE  KETONESUR NEGATIVE  PROTEINUR 100*  NITRITE NEGATIVE  LEUKOCYTESUR NEGATIVE      Imaging: US Renal  Result Date: 11/16/2017 CLINICAL DATA:  Acute renal failure EXAM: RENAL / URINARY TRACT ULTRASOUND COMPLETE COMPARISON:  Ultrasound 10/20/2014 FINDINGS: Right Kidney: Length: 11.9  cm. Echogenicity within normal limits. No mass or hydronephrosis visualized. Left Kidney: Length: 13.4 cm. Echogenicity within normal limits. No mass or hydronephrosis visualized. Bladder: Appears normal for degree of bladder distention. IMPRESSION: No acute findings.  No hydronephrosis. Electronically Signed   By: Rolm Baptise M.D.   On: 11/16/2017 09:48     Medications:   . sodium chloride 125 mL/hr at 11/17/17 0614  . heparin 1,600 Units/hr (11/17/17 2025)   . atorvastatin  20 mg Oral Daily  . Chlorhexidine Gluconate Cloth  6 each Topical Q0600  . ferrous sulfate  325 mg Oral BID WC  . folic acid  4,270 mcg Oral  Daily  . insulin aspart  0-15 Units Subcutaneous TID WC  . insulin aspart  0-5 Units Subcutaneous QHS  . letrozole  2.5 mg Oral Daily  . levothyroxine  112 mcg Oral QAC breakfast  . sertraline  50 mg Oral Daily  . vitamin C  500 mg Oral BID   acetaminophen **OR** acetaminophen, ALPRAZolam, hydrALAZINE, HYDROcodone-acetaminophen, ondansetron **OR** ondansetron (ZOFRAN) IV, polyethylene glycol, promethazine  Assessment/ Plan:  52 y.o. female breast cancer, diabetes mellitus type 2, gout, hypertension, pulmonary embolism, prior episode of acute renal failure who was admitted now for acute renal failure.  1.  Acute renal failure. 2.  Hyponatremia. 3.  Anemia unspecified. 4.  Lower extremity edema. 5.  Hyperkalemia.  Plan:  Renal function has actually deteriorated.  Creatinine up to 6.3 with a BUN of 62.  Hyperkalemia also noted with a serum potassium of 5.4.  As such we recommend initiation of renal replacement therapy.  We will consult with vascular surgery to place a temporary dialysis catheter.  Thereafter we will start the patient on hemodialysis for 1.5 hours today.  Hopefully this will help to correct serum potassium.  Otherwise continue supportive care for now.  Reduce IV fluids to 50 cc/h.   LOS: 2 Nicole Schmidt 10/5/201912:42 PM

## 2017-11-18 ENCOUNTER — Inpatient Hospital Stay: Payer: BLUE CROSS/BLUE SHIELD

## 2017-11-18 DIAGNOSIS — N179 Acute kidney failure, unspecified: Principal | ICD-10-CM

## 2017-11-18 DIAGNOSIS — R110 Nausea: Secondary | ICD-10-CM

## 2017-11-18 DIAGNOSIS — I10 Essential (primary) hypertension: Secondary | ICD-10-CM

## 2017-11-18 DIAGNOSIS — E860 Dehydration: Secondary | ICD-10-CM

## 2017-11-18 LAB — GLUCOSE, CAPILLARY
Glucose-Capillary: 90 mg/dL (ref 70–99)
Glucose-Capillary: 112 mg/dL — ABNORMAL HIGH (ref 70–99)
Glucose-Capillary: 120 mg/dL — ABNORMAL HIGH (ref 70–99)
Glucose-Capillary: 92 mg/dL (ref 70–99)

## 2017-11-18 LAB — HEPARIN LEVEL (UNFRACTIONATED): Heparin Unfractionated: 0.95 IU/mL — ABNORMAL HIGH (ref 0.30–0.70)

## 2017-11-18 LAB — PHOSPHORUS: Phosphorus: 6.5 mg/dL — ABNORMAL HIGH (ref 2.5–4.6)

## 2017-11-18 LAB — RENAL FUNCTION PANEL
Albumin: 3.1 g/dL — ABNORMAL LOW (ref 3.5–5.0)
Anion gap: 10 (ref 5–15)
BUN: 63 mg/dL — ABNORMAL HIGH (ref 6–20)
CO2: 24 mmol/L (ref 22–32)
Calcium: 8.3 mg/dL — ABNORMAL LOW (ref 8.9–10.3)
Chloride: 98 mmol/L (ref 98–111)
Creatinine, Ser: 5.48 mg/dL — ABNORMAL HIGH (ref 0.44–1.00)
GFR calc Af Amer: 9 mL/min — ABNORMAL LOW (ref >60)
GFR calc non Af Amer: 8 mL/min — ABNORMAL LOW (ref >60)
Glucose, Bld: 111 mg/dL — ABNORMAL HIGH (ref 70–99)
Phosphorus: 7.6 mg/dL — ABNORMAL HIGH (ref 2.5–4.6)
Potassium: 4.7 mmol/L (ref 3.5–5.1)
Sodium: 132 mmol/L — ABNORMAL LOW (ref 135–145)

## 2017-11-18 LAB — CBC
HCT: 31.8 % — ABNORMAL LOW (ref 35.0–47.0)
Hemoglobin: 10.2 g/dL — ABNORMAL LOW (ref 12.0–16.0)
MCH: 29.3 pg (ref 26.0–34.0)
MCHC: 32.1 g/dL (ref 32.0–36.0)
MCV: 91.5 fL (ref 80.0–100.0)
Platelets: 202 10*3/uL (ref 150–440)
RBC: 3.48 MIL/uL — ABNORMAL LOW (ref 3.80–5.20)
RDW: 15.8 % — ABNORMAL HIGH (ref 11.5–14.5)
WBC: 10.7 10*3/uL (ref 3.6–11.0)

## 2017-11-18 LAB — APTT: aPTT: 79 seconds — ABNORMAL HIGH (ref 24–36)

## 2017-11-18 MED ORDER — SODIUM CHLORIDE 0.9 % IV SOLN: 100.0000 mL | INTRAVENOUS | Status: DC | PRN

## 2017-11-18 MED ORDER — LIDOCAINE HCL (PF) 1 % IJ SOLN: 5.0000 mL | INTRAMUSCULAR | Status: DC | PRN

## 2017-11-18 MED ORDER — HEPARIN SODIUM (PORCINE) 1000 UNIT/ML DIALYSIS: 1000.0000 [IU] | INTRAMUSCULAR | Status: DC | PRN

## 2017-11-18 MED ORDER — ALTEPLASE 2 MG IJ SOLR: 2.0000 mg | Freq: Once | INTRAMUSCULAR | Status: DC | PRN

## 2017-11-18 MED ORDER — PENTAFLUOROPROP-TETRAFLUOROETH EX AERO
1.0000 "application " | INHALATION_SPRAY | CUTANEOUS | Status: DC | PRN
  Filled 2017-11-18: qty 30

## 2017-11-18 MED ORDER — LIDOCAINE-PRILOCAINE 2.5-2.5 % EX CREA: 1.0000 "application " | TOPICAL_CREAM | CUTANEOUS | Status: DC | PRN

## 2017-11-18 NOTE — Progress Notes (Signed)
This note also relates to the following rows which could not be included: Pulse Rate - Cannot attach notes to unvalidated device data Resp - Cannot attach notes to unvalidated device data  Hd started  

## 2017-11-18 NOTE — Progress Notes (Signed)
ANTICOAGULATION CONSULT NOTE - Initial Consult  Pharmacy Consult for heparin Indication: apixaban PTA but admitted with AKI so switching AC to UFH (hx PE 10/2014 but no acute indication noted)  No Known Allergies  Patient Measurements: Height: 5\' 3"  (160 cm) Weight: (!) 345 lb 11.2 oz (156.8 kg) IBW/kg (Calculated) : 52.4 Heparin Dosing Weight: 90.9 kg  Vital Signs: BP: 156/58 (10/06 0553) Pulse Rate: 72 (10/06 0553)  Labs: Recent Labs    11/16/17 0405 11/16/17 1038  11/16/17 1657 11/17/17 0000 11/18/17 0508 11/18/17 0509  HGB 9.8*  --   --   --  10.5* 10.2*  --   HCT 28.2*  --   --   --  33.0* 31.8*  --   PLT 186  --   --   --  198 202  --   APTT  --   --    < > 64* 87* 79*  --   LABPROT  --  15.8*  --   --   --   --   --   INR  --  1.27  --   --   --   --   --   HEPARINUNFRC  --  2.44*  --   --   --  0.95*  --   CREATININE 5.42*  --   --   --  6.30*  --  5.48*   < > = values in this interval not displayed.    Estimated Creatinine Clearance: 17.5 mL/min (A) (by C-G formula based on SCr of 5.48 mg/dL (H)).   Medical History: Past Medical History:  Diagnosis Date  . Anemia   . Arthritis   . Breast cancer of upper-inner quadrant of right female breast ([place])    Right breast invasive CA and DCIS , T1,N0,M0. Er/PR pos, her 2 negative  . Diabetes mellitus without complication ([place])   . Gout   . Gout   . Hypertension   . Menopause    age 52  . Personal history of radiation therapy   . Pulmonary embolism ([place]) 10/2014  . Renal insufficiency   . Thyroid goiter     Medications:  Infusions:  . sodium chloride 50 mL/hr at 11/18/17 0522  . heparin 1,600 Units/hr (11/18/17 0522)    Assessment: 89 yof direct admit from oncology clinic for increased serum creatinine. Patient takes apixaban PTA for history of PE in 2016. Due to AKI we will transition to heparin infusion while patient is worked up for AKI.   Goal of Therapy:  Heparin level 0.3-0.7 units/ml aPTT 68  to 109 seconds Monitor platelets by anticoagulation protocol: Yes   Plan:  10/04 @ 1657 aPPT 64 which is slightly subtherapeutic.  Will bolus with 1350 units and then increase rate to 1600 units/hr Check aPTT in 6 hours, monitor HL daily until HL and aPTT correlate per protocol Continue to monitor H&H and platelets  10/05 0000 aPTT 87. Continue current regimen  Recheck aPTT, heparin level, and CBC with 10/6 AM labs.  10/06 AM aPTT 79. Continue current regimen. Recheck aPTT, heparin level and CBC with tomorrow AM labs.  Sim Boast, PharmD, BCPS  11/18/17 6:30 AM

## 2017-11-18 NOTE — Procedures (Addendum)
Hemodiaylsis Catheter Insertion Procedure Note Nicole Schmidt [id number] 1965-06-02  Procedure: Insertion of Temporary Hemodialysis Catheter Indications: Acute renal failure requiring HD  Procedure Details Consent: Risks of procedure as well as the alternatives and risks of each were explained to the (patient/caregiver).  Consent for procedure obtained. Time Out: Verified patient identification, verified procedure, site/side was marked, verified correct patient position, special equipment/implants available, medications/allergies/relevent history reviewed, required imaging and test results available.  Performed  Maximum sterile technique was used including antiseptics, cap, gloves, gown, hand hygiene, mask and sheet. Skin prep: Chlorhexidine; local anesthetic administered A antimicrobial bonded/coated double lumen temporary hemodialysis catheter was placed in the right internal jugular vein using the Seldinger technique.  Evaluation Blood flow good Complications: No apparent complications Patient did tolerate procedure well. Chest X-ray ordered to verify placement.  CXR: normal.  Nicole Schmidt A 11/18/2017, 11:27 AM

## 2017-11-18 NOTE — Consult Note (Signed)
Reason for Consult: ARF Referring Physician: Dr. Othelia Pulling is an 52 y.o. female.  HPI: patient with history of Breast cancer, HTN. Recently started on a number of antihypertensives., dehydration, low Urine output and nausea. Presented with ARF Cr- 4.5. Patient states she has been tolerating PO without nausea/vomiting and has been urinating lots since yesterday. Overall feeling better.  Past Medical History:  Diagnosis Date  . Anemia   . Arthritis   . Breast cancer of upper-inner quadrant of right female breast ([place])    Right breast invasive CA and DCIS , T1,N0,M0. Er/PR pos, her 2 negative  . Diabetes mellitus without complication ([place])   . Gout   . Gout   . Hypertension   . Menopause    age 89  . Personal history of radiation therapy   . Pulmonary embolism ([place]) 10/2014  . Renal insufficiency   . Thyroid goiter     Past Surgical History:  Procedure Laterality Date  . BREAST BIOPSY Right 2014   breast ca  . BREAST EXCISIONAL BIOPSY Right 07/19/2012   breast ca  . BREAST SURGERY Right 2014   with sentinel node bx subareaolar duct excision  . COLONOSCOPY WITH PROPOFOL N/A 01/30/2017   Procedure: COLONOSCOPY WITH PROPOFOL;  Surgeon: Christene Lye, MD;  Location: [facility] ENDOSCOPY;  Service: Endoscopy;  Laterality: N/A;    Family History  Problem Relation Age of Onset  . Diabetes Father   . Hypertension Father   . Parkinson's disease Father   . Hypertension Mother   . Rheum arthritis Mother   . Atrial fibrillation Mother   . Heart failure Mother   . Colon cancer Cousin 61  . Breast cancer Neg Hx     Social History:  reports that she has never smoked. She has never used smokeless tobacco. She reports that she does not drink alcohol or use drugs.  Allergies: No Known Allergies  Medications: I have reviewed the patient's current medications.  Results for orders placed or performed during the hospital encounter of 11/15/17 (from the past 48 hour(s))   Protime-INR     Status: Abnormal   Collection Time: 11/16/17 10:38 AM  Result Value Ref Range   Prothrombin Time 15.8 (H) 11.4 - 15.2 seconds   INR 1.27     Comment: Performed at [facility], [place], [place] [address]  Heparin level (unfractionated)     Status: Abnormal   Collection Time: 11/16/17 10:38 AM  Result Value Ref Range   Heparin Unfractionated 2.44 (H) 0.30 - 0.70 IU/mL    Comment: RESULTS CONFIRMED BY MANUAL DILUTION (NOTE) If heparin results are below expected values, and patient dosage has  been confirmed, suggest follow up testing of antithrombin III levels. Performed at [facility], [address] [place], [place] [address]   Glucose, capillary     Status: None   Collection Time: 11/16/17 11:43 AM  Result Value Ref Range   Glucose-Capillary 96 70 - 99 mg/dL  APTT     Status: Abnormal   Collection Time: 11/16/17 12:24 PM  Result Value Ref Range   aPTT 72 (H) 24 - 36 seconds    Comment:        IF BASELINE aPTT IS ELEVATED, SUGGEST PATIENT RISK ASSESSMENT BE USED TO DETERMINE APPROPRIATE ANTICOAGULANT THERAPY. Performed at [facility], [address] [place], [place] [address]   Protein / creatinine ratio, urine     Status: Abnormal   Collection Time:  11/16/17  2:06 PM  Result Value Ref Range   Creatinine, Urine 36 mg/dL   Total Protein, Urine 344 mg/dL    Comment: RESULT CONFIRMED BY MANUAL DILUTION. JML NO NORMAL RANGE ESTABLISHED FOR THIS TEST    Protein Creatinine Ratio 9.56 (H) 0.00 - 0.15 mg/mg[Cre]    Comment: Performed at [facility], [name] [place], [place] [address]  Glucose, capillary     Status: None   Collection Time: 11/16/17  4:43 PM  Result Value Ref Range   Glucose-Capillary 73 70 - 99 mg/dL  APTT     Status: Abnormal   Collection Time: 11/16/17  4:57 PM  Result Value Ref Range   aPTT 64 (H) 24 - 36 seconds    Comment:        IF BASELINE aPTT IS ELEVATED, SUGGEST  PATIENT RISK ASSESSMENT BE USED TO DETERMINE APPROPRIATE ANTICOAGULANT THERAPY. Performed at [facility], [address] [place], [place] [address]   ANA w/Reflex if Positive     Status: None   Collection Time: 11/16/17  4:57 PM  Result Value Ref Range   Anti Nuclear Antibody(ANA) Negative Negative    Comment: (NOTE) Performed At: [facility] [place], [place] [id number] Rush Farmer MD [id number]   C3 complement     Status: None   Collection Time: 11/16/17  4:57 PM  Result Value Ref Range   C3 Complement 103 82 - 167 mg/dL    Comment: (NOTE) Performed At: [facility] [place], [place] [id number] Rush Farmer MD [id number]   C4 complement     Status: None   Collection Time: 11/16/17  4:57 PM  Result Value Ref Range   Complement C4, Body Fluid 36 14 - 44 mg/dL    Comment: (NOTE) Performed At: [facility] [place], [place] [id number] Rush Farmer MD [id number]   Mpo/pr-3 (anca) antibodies     Status: None   Collection Time: 11/16/17  4:57 PM  Result Value Ref Range   Myeloperoxidase Abs <9.0 0.0 - 9.0 U/mL   ANCA Proteinase 3 <3.5 0.0 - 3.5 U/mL    Comment: (NOTE) Performed At: [facility] [place], [place] [id number] Rush Farmer MD [id number]   Glucose, capillary     Status: None   Collection Time: 11/16/17  9:00 PM  Result Value Ref Range   Glucose-Capillary 77 70 - 99 mg/dL  Glucose, capillary     Status: None   Collection Time: 11/16/17 10:09 PM  Result Value Ref Range   Glucose-Capillary 83 70 - 99 mg/dL  Basic metabolic panel     Status: Abnormal   Collection Time: 11/17/17 12:00 AM  Result Value Ref Range   Sodium 128 (L) 135 - 145 mmol/L   Potassium 5.4 (H) 3.5 - 5.1 mmol/L   Chloride 92 (L) 98 - 111 mmol/L   CO2 23 22 - 32 mmol/L   Glucose, Bld 100 (H) 70 - 99 mg/dL   BUN 62 (H) 6 - 20 mg/dL   Creatinine, Ser 6.30 (H) 0.44 -  1.00 mg/dL   Calcium 8.4 (L) 8.9 - 10.3 mg/dL   GFR calc non Af Amer 7 (L) >60 mL/min   GFR calc Af Amer 8 (L) >60 mL/min    Comment: (NOTE) The eGFR has been calculated using the CKD EPI equation. This calculation has not been validated in all clinical situations. eGFR's persistently <60 mL/min signify possible Chronic Kidney Disease.  Anion gap 13 5 - 15    Comment: Performed at [facility], [address] [place], [place] [address]  CBC     Status: Abnormal   Collection Time: 11/17/17 12:00 AM  Result Value Ref Range   WBC 11.2 (H) 3.6 - 11.0 K/uL   RBC 3.57 (L) 3.80 - 5.20 MIL/uL   Hemoglobin 10.5 (L) 12.0 - 16.0 g/dL   HCT 33.0 (L) 35.0 - 47.0 %   MCV 92.4 80.0 - 100.0 fL   MCH 29.4 26.0 - 34.0 pg   MCHC 31.8 (L) 32.0 - 36.0 g/dL   RDW 16.0 (H) 11.5 - 14.5 %   Platelets 198 150 - 440 K/uL    Comment: Performed at [facility], [address] [place], [place] [address]  APTT     Status: Abnormal   Collection Time: 11/17/17 12:00 AM  Result Value Ref Range   aPTT 87 (H) 24 - 36 seconds    Comment:        IF BASELINE aPTT IS ELEVATED, SUGGEST PATIENT RISK ASSESSMENT BE USED TO DETERMINE APPROPRIATE ANTICOAGULANT THERAPY. Performed at [facility], [address] [place], [place] [address]   Glucose, capillary     Status: None   Collection Time: 11/17/17  7:37 AM  Result Value Ref Range   Glucose-Capillary 76 70 - 99 mg/dL  Glucose, capillary     Status: None   Collection Time: 11/17/17  1:06 PM  Result Value Ref Range   Glucose-Capillary 93 70 - 99 mg/dL  Glucose, capillary     Status: Abnormal   Collection Time: 11/17/17  4:40 PM  Result Value Ref Range   Glucose-Capillary 102 (H) 70 - 99 mg/dL  Glucose, capillary     Status: None   Collection Time: 11/17/17 10:28 PM  Result Value Ref Range   Glucose-Capillary 93 70 - 99 mg/dL  APTT     Status: Abnormal   Collection Time: 11/18/17  5:08 AM  Result Value Ref Range   aPTT 79  (H) 24 - 36 seconds    Comment:        IF BASELINE aPTT IS ELEVATED, SUGGEST PATIENT RISK ASSESSMENT BE USED TO DETERMINE APPROPRIATE ANTICOAGULANT THERAPY. Performed at [facility], [address] [place], [place] [address]   CBC     Status: Abnormal   Collection Time: 11/18/17  5:08 AM  Result Value Ref Range   WBC 10.7 3.6 - 11.0 K/uL   RBC 3.48 (L) 3.80 - 5.20 MIL/uL   Hemoglobin 10.2 (L) 12.0 - 16.0 g/dL   HCT 31.8 (L) 35.0 - 47.0 %   MCV 91.5 80.0 - 100.0 fL   MCH 29.3 26.0 - 34.0 pg   MCHC 32.1 32.0 - 36.0 g/dL   RDW 15.8 (H) 11.5 - 14.5 %   Platelets 202 150 - 440 K/uL    Comment: Performed at [facility], [address] [place], [place] [address]  Heparin level (unfractionated)     Status: Abnormal   Collection Time: 11/18/17  5:08 AM  Result Value Ref Range   Heparin Unfractionated 0.95 (H) 0.30 - 0.70 IU/mL    Comment: (NOTE) If heparin results are below expected values, and patient dosage has  been confirmed, suggest follow up testing of antithrombin III levels. Performed at [facility], [address], [place],  [address]   Renal function panel     Status: Abnormal   Collection Time: 11/18/17  5:09 AM  Result Value Ref Range   Sodium 132 (L) 135 - 145 mmol/L   Potassium 4.7 3.5 - 5.1 mmol/L   Chloride 98 98 - 111 mmol/L   CO2 24 22 - 32 mmol/L   Glucose, Bld 111 (H) 70 - 99 mg/dL   BUN 63 (H) 6 - 20 mg/dL   Creatinine, Ser 5.48 (H) 0.44 - 1.00 mg/dL   Calcium 8.3 (L) 8.9 - 10.3 mg/dL   Phosphorus 7.6 (H) 2.5 - 4.6 mg/dL   Albumin 3.1 (L) 3.5 - 5.0 g/dL   GFR calc non Af Amer 8 (L) >60 mL/min   GFR calc Af Amer 9 (L) >60 mL/min    Comment: (NOTE) The eGFR has been calculated using the CKD EPI equation. This calculation has not been validated in all clinical situations. eGFR's persistently <60 mL/min signify possible Chronic Kidney Disease.    Anion gap 10 5 - 15    Comment: Performed at [facility],  [address] [place], [place] [address]  Glucose, capillary     Status: None   Collection Time: 11/18/17  7:39 AM  Result Value Ref Range   Glucose-Capillary 90 70 - 99 mg/dL    US Renal  Result Date: 11/16/2017 CLINICAL DATA:  Acute renal failure EXAM: RENAL / URINARY TRACT ULTRASOUND COMPLETE COMPARISON:  Ultrasound 10/20/2014 FINDINGS: Right Kidney: Length: 11.9 cm. Echogenicity within normal limits. No mass or hydronephrosis visualized. Left Kidney: Length: 13.4 cm. Echogenicity within normal limits. No mass or hydronephrosis visualized. Bladder: Appears normal for degree of bladder distention. IMPRESSION: No acute findings.  No hydronephrosis. Electronically Signed   By: Rolm Baptise M.D.   On: 11/16/2017 09:48    Review of Systems  Constitutional: Positive for diaphoresis and malaise/fatigue.  Eyes: Negative.   Respiratory: Negative for cough and shortness of breath.   Cardiovascular: Negative for chest pain, palpitations, orthopnea and leg swelling.  Gastrointestinal: Positive for nausea and vomiting. Negative for abdominal pain.   Blood pressure (!) 156/58, pulse 72, temperature 98 F (36.7 C), temperature source Oral, resp. rate 12, height '5\' 3"'  (1.6 m), weight (!) 154.9 kg, last menstrual period 07/17/2012, SpO2 97 %. Physical Exam  Nursing note and vitals reviewed. Constitutional: She is oriented to person, place, and time. She appears well-developed and well-nourished. No distress.  Cardiovascular: Normal rate and regular rhythm.  Respiratory: Effort normal and breath sounds normal. No respiratory distress.  GI: Soft. She exhibits no distension. There is no tenderness.  Musculoskeletal: She exhibits no edema.  Neurological: She is alert and oriented to person, place, and time.  Skin: Skin is warm.    Assessment/Plan: ARF  Creatinine and Potassium improved from yesterday, improved UO and tolerating PO although markers still elevated with hydration and medical  management.  Will discuss with Nephrology to determine if Dialysis is still required with current trending down of markers. If so will plan for Temporary catheter.  Horrace Hanak A 11/18/2017, 8:53 AM

## 2017-11-18 NOTE — Progress Notes (Signed)
Patient ID: Nicole Schmidt, female   DOB: 24-Apr-1965, 52 y.o.   MRN: [id number]  [employer] PROGRESS NOTE  [name]:[id number] DOB: Sep 11, 1965 DOA: 11/15/2017 PCP: Lenard Simmer, MD  HPI/Subjective:  Patient's nausea vomiting is resolved she is feeling better  Objective: Vitals:   11/17/17 2232 11/18/17 0553  BP: (!) 164/68 (!) 156/58  Pulse: 79 72  Resp:    Temp:    SpO2: 96% 97%    Filed Weights   11/15/17 1810 11/17/17 0412 11/18/17 0700  Weight: (!) 150 kg (!) 156.8 kg (!) 154.9 kg    ROS: Review of Systems  Constitutional: Positive for malaise/fatigue. Negative for chills and fever.  Eyes: Negative for blurred vision.  Respiratory: Negative for cough and shortness of breath.   Cardiovascular: Negative for chest pain.  Gastrointestinal: Negative for abdominal pain, constipation, diarrhea, nausea and vomiting.  Genitourinary: Negative for dysuria.  Musculoskeletal: Negative for joint pain.  Neurological: Positive for headaches. Negative for dizziness.   Exam: Physical Exam  Constitutional: She is oriented to person, place, and time.  HENT:  Nose: No mucosal edema.  Mouth/Throat: No oropharyngeal exudate or posterior oropharyngeal edema.  Eyes: Pupils are equal, round, and reactive to light. Conjunctivae, EOM and lids are normal.  Neck: No JVD present. Carotid bruit is not present. No edema present. No thyroid mass and no thyromegaly present.  Cardiovascular: S1 normal and S2 normal. Exam reveals no gallop.  No murmur heard. Pulses:      Dorsalis pedis pulses are 2+ on the right side, and 2+ on the left side.  Respiratory: No respiratory distress. She has decreased breath sounds in the right lower field and the left lower field. She has no wheezes. She has no rhonchi. She has no rales.  GI: Soft. Bowel sounds are normal. There is no tenderness.  Musculoskeletal:       Right ankle: She exhibits swelling.       Left ankle: She exhibits swelling.   Lymphadenopathy:    She has no cervical adenopathy.  Neurological: She is alert and oriented to person, place, and time. No cranial nerve deficit.  Skin: Skin is warm. No rash noted. Nails show no clubbing.  Psychiatric: She has a normal mood and affect.      Data Reviewed: Basic Metabolic Panel: Recent Labs  Lab 11/15/17 1500 11/16/17 0405 11/17/17 0000 11/18/17 0509  NA 129* 127* 128* 132*  K 4.9 4.1 5.4* 4.7  CL 95* 91* 92* 98  CO2 20* 22 23 24   GLUCOSE 121* 90 100* 111*  BUN 59* 60* 62* 63*  CREATININE 4.54* 5.42* 6.30* 5.48*  CALCIUM 9.4 8.3* 8.4* 8.3*  PHOS  --   --   --  7.6*   Liver Function Tests: Recent Labs  Lab 11/15/17 1500 11/18/17 0509  AST 37  --   ALT 21  --   ALKPHOS 115  --   BILITOT 0.8  --   PROT 7.1  --   ALBUMIN 3.9 3.1*   CBC: Recent Labs  Lab 11/15/17 1500 11/16/17 0405 11/17/17 0000 11/18/17 0508  WBC 15.1* 13.2* 11.2* 10.7  NEUTROABS 11.9*  --   --   --   HGB 10.3* 9.8* 10.5* 10.2*  HCT 32.3* 28.2* 33.0* 31.8*  MCV 92.2 91.9 92.4 91.5  PLT 232 186 198 202    CBG: Recent Labs  Lab 11/17/17 1306 11/17/17 1640 11/17/17 2228 11/18/17 0739 11/18/17 1131  GLUCAP 93 102* 93 90 112*  Studies: Dg Chest Port 1 View  Result Date: 11/18/2017 CLINICAL DATA:  Status post dialysis catheter placement today. EXAM: PORTABLE CHEST 1 VIEW COMPARISON:  Single-view of the chest 02/28/2016. FINDINGS: Right IJ approach double lumen central venous catheter tip projects near the inferior cavoatrial junction. The line could be withdrawn approximately 4.5 cm for positioning in the right atrium. No pneumothorax. There is cardiomegaly and mild interstitial edema. Aortic atherosclerosis noted. IMPRESSION: Dialysis catheter tip projects at the inferior cavoatrial junction. The line could be withdrawn approximately 4.5 cm for positioning in the right atrium. No pneumothorax. Cardiomegaly and interstitial pulmonary edema. Electronically Signed   By:  Inge Rise M.D.   On: 11/18/2017 11:24    Scheduled Meds: . atorvastatin  20 mg Oral Daily  . Chlorhexidine Gluconate Cloth  6 each Topical Q0600  . ferrous sulfate  325 mg Oral BID WC  . folic acid  4,801 mcg Oral Daily  . insulin aspart  0-15 Units Subcutaneous TID WC  . insulin aspart  0-5 Units Subcutaneous QHS  . letrozole  2.5 mg Oral Daily  . levothyroxine  112 mcg Oral QAC breakfast  . sertraline  50 mg Oral Daily  . vitamin C  500 mg Oral BID   Continuous Infusions: . sodium chloride 50 mL/hr at 11/18/17 0522  . sodium chloride    . sodium chloride    . heparin 1,600 Units/hr (11/18/17 0522)    Assessment/Plan:  1. Acute kidney injury, with nausea vomiting.  Seen by nephrology patient had a dialysis catheter today she will undergo hemodialysis 2. History of breast cancer on letrozole 3. History of pulmonary embolism on Eliquis as outpatient.  Continue IV heparin for now 4. Hyponatremia secondary to dehydration 5. Depression on Zoloft 6. Hypothyroidism unspecified on levothyroxine 7. Hyperlipidemia unspecified on atorvastatin 8. Morbid obesity with a BMI of 58.58.  Weight loss needed  Code Status:     Code Status Orders  (From admission, onward)         Start     Ordered   11/15/17 2056  Full code  Continuous     11/15/17 2055        Code Status History    Date Active Date Inactive Code Status Order ID Comments User Context   10/18/2014 2221 10/23/2014 2043 Full Code [id number]  Jacqulynn Cadet, MD Inpatient   10/18/2014 2119 10/18/2014 2221 Full Code [id number]  Guy Begin, MD Inpatient    Advance Directive Documentation     Most Recent Value  Type of Advance Directive  Healthcare Power of Attorney, Living will  Pre-existing out of facility DNR order (yellow form or pink MOST form)  -  "MOST" Form in Place?  -     Family Communication: Family at bedside Disposition Plan: Kidney function will need to be improved prior to  disposition  Consultants:  Nephrology  Time spent: 28 minutes  [place] [employer]

## 2017-11-18 NOTE — Progress Notes (Signed)
[facility]  ROUNDING NOTE   Subjective:  Temporary dialysis catheter was placed just this a.m. Patient due for first dialysis treatment today therefore. Overall renal function remains low. However patient is making more urine now.   Objective:  Vital signs in last 24 hours:  Temp:  [98 F (36.7 C)] 98 F (36.7 C) (10/05 1437) Pulse Rate:  [70-79] 72 (10/06 0553) Resp:  [12] 12 (10/05 1437) BP: (144-164)/(58-68) 156/58 (10/06 0553) SpO2:  [96 %-97 %] 97 % (10/06 0553) Weight:  [154.9 kg] 154.9 kg (10/06 0700)  Weight change: -1.909 kg Filed Weights   11/15/17 1810 11/17/17 0412 11/18/17 0700  Weight: (!) 150 kg (!) 156.8 kg (!) 154.9 kg    Intake/Output: I/O last 3 completed shifts: In: 6319.1 [P.O.:720; I.V.:5599.1] Out: 300 [Emesis/NG output:300]   Intake/Output this shift:  Total I/O In: 240 [P.O.:240] Out: -   Physical Exam: General: No acute distress  Head: Normocephalic, atraumatic. Moist oral mucosal membranes  Eyes: Anicteric  Neck: Supple, trachea midline  Lungs:  Clear to auscultation, normal effort  Heart: S1S2 no rubs  Abdomen:  Soft, nontender, bowel sounds present  Extremities: 1+ peripheral edema.  Neurologic: Awake, alert, following commands  Skin: No lesions       Basic Metabolic Panel: Recent Labs  Lab 11/15/17 1500 11/16/17 0405 11/17/17 0000 11/18/17 0509  NA 129* 127* 128* 132*  K 4.9 4.1 5.4* 4.7  CL 95* 91* 92* 98  CO2 20* 22 23 24   GLUCOSE 121* 90 100* 111*  BUN 59* 60* 62* 63*  CREATININE 4.54* 5.42* 6.30* 5.48*  CALCIUM 9.4 8.3* 8.4* 8.3*  PHOS  --   --   --  7.6*    Liver Function Tests: Recent Labs  Lab 11/15/17 1500 11/18/17 0509  AST 37  --   ALT 21  --   ALKPHOS 115  --   BILITOT 0.8  --   PROT 7.1  --   ALBUMIN 3.9 3.1*   No results for input(s): LIPASE, AMYLASE in the last 168 hours. No results for input(s): AMMONIA in the last 168 hours.  CBC: Recent Labs  Lab 11/15/17 1500  11/16/17 0405 11/17/17 0000 11/18/17 0508  WBC 15.1* 13.2* 11.2* 10.7  NEUTROABS 11.9*  --   --   --   HGB 10.3* 9.8* 10.5* 10.2*  HCT 32.3* 28.2* 33.0* 31.8*  MCV 92.2 91.9 92.4 91.5  PLT 232 186 198 202    Cardiac Enzymes: No results for input(s): CKTOTAL, CKMB, CKMBINDEX, TROPONINI in the last 168 hours.  BNP: Invalid input(s): POCBNP  CBG: Recent Labs  Lab 11/17/17 1306 11/17/17 1640 11/17/17 2228 11/18/17 0739 11/18/17 1131  GLUCAP 93 102* 97 90 112*    Microbiology: Results for orders placed or performed during the hospital encounter of 10/18/14  MRSA PCR Screening     Status: None   Collection Time: 10/18/14  8:15 PM  Result Value Ref Range Status   MRSA by PCR NEGATIVE NEGATIVE Final    Comment:        The GeneXpert MRSA Assay (FDA approved for NASAL specimens only), is one component of a comprehensive MRSA colonization surveillance program. It is not intended to diagnose MRSA infection nor to guide or monitor treatment for MRSA infections.     Coagulation Studies: Recent Labs    11/16/17 1038  LABPROT 15.8*  INR 1.27    Urinalysis: No results for input(s): COLORURINE, LABSPEC, PHURINE, GLUCOSEU, HGBUR, BILIRUBINUR, KETONESUR, PROTEINUR, UROBILINOGEN, NITRITE,  LEUKOCYTESUR in the last 72 hours.  Invalid input(s): APPERANCEUR    Imaging: Dg Chest Port 1 View  Result Date: 11/18/2017 CLINICAL DATA:  Status post dialysis catheter placement today. EXAM: PORTABLE CHEST 1 VIEW COMPARISON:  Single-view of the chest 02/28/2016. FINDINGS: Right IJ approach double lumen central venous catheter tip projects near the inferior cavoatrial junction. The line could be withdrawn approximately 4.5 cm for positioning in the right atrium. No pneumothorax. There is cardiomegaly and mild interstitial edema. Aortic atherosclerosis noted. IMPRESSION: Dialysis catheter tip projects at the inferior cavoatrial junction. The line could be withdrawn approximately 4.5 cm  for positioning in the right atrium. No pneumothorax. Cardiomegaly and interstitial pulmonary edema. Electronically Signed   By: Inge Rise M.D.   On: 11/18/2017 11:24     Medications:   . sodium chloride 50 mL/hr at 11/18/17 0522  . sodium chloride    . sodium chloride    . heparin 1,600 Units/hr (11/18/17 0522)   . atorvastatin  20 mg Oral Daily  . Chlorhexidine Gluconate Cloth  6 each Topical Q0600  . ferrous sulfate  325 mg Oral BID WC  . folic acid  0,301 mcg Oral Daily  . insulin aspart  0-15 Units Subcutaneous TID WC  . insulin aspart  0-5 Units Subcutaneous QHS  . letrozole  2.5 mg Oral Daily  . levothyroxine  112 mcg Oral QAC breakfast  . sertraline  50 mg Oral Daily  . vitamin C  500 mg Oral BID   sodium chloride, sodium chloride, acetaminophen **OR** acetaminophen, ALPRAZolam, alteplase, heparin, hydrALAZINE, HYDROcodone-acetaminophen, lidocaine (PF), lidocaine-prilocaine, ondansetron **OR** ondansetron (ZOFRAN) IV, pentafluoroprop-tetrafluoroeth, polyethylene glycol, promethazine  Assessment/ Plan:  52 y.o. female breast cancer, diabetes mellitus type 2, gout, hypertension, pulmonary embolism, prior episode of acute renal failure who was admitted now for acute renal failure.  1.  Acute renal failure. 2.  Hyponatremia. 3.  Anemia unspecified. 4.  Lower extremity edema. 5.  Hyperkalemia.  Plan:  Temporary dialysis catheter was placed as this a.m.  Therefore we will proceed with first dialysis treatment today.  She is starting to make larger amounts of urine.  Reassess for additional need of dialysis tomorrow.  Hyperkalemia has improved this a.m.  Serum sodium currently up to 132.  Continue IV fluid hydration for now.   LOS: 3 Isaiah Cianci 10/6/201911:36 AM

## 2017-11-18 NOTE — Progress Notes (Signed)
This note also relates to the following rows which could not be included: Pulse Rate - Cannot attach notes to unvalidated device data Resp - Cannot attach notes to unvalidated device data BP - Cannot attach notes to unvalidated device data  Hd completed  

## 2017-11-19 LAB — CBC
HCT: 32.2 % — ABNORMAL LOW (ref 35.0–47.0)
Hemoglobin: 10.2 g/dL — ABNORMAL LOW (ref 12.0–16.0)
MCH: 29.5 pg (ref 26.0–34.0)
MCHC: 31.8 g/dL — ABNORMAL LOW (ref 32.0–36.0)
MCV: 92.7 fL (ref 80.0–100.0)
Platelets: 215 10*3/uL (ref 150–440)
RBC: 3.48 MIL/uL — ABNORMAL LOW (ref 3.80–5.20)
RDW: 16 % — ABNORMAL HIGH (ref 11.5–14.5)
WBC: 9.7 10*3/uL (ref 3.6–11.0)

## 2017-11-19 LAB — PROTEIN ELECTROPHORESIS, SERUM
A/G Ratio: 1.1 (ref 0.7–1.7)
Albumin ELP: 3.1 g/dL (ref 2.9–4.4)
Alpha-1-Globulin: 0.2 g/dL (ref 0.0–0.4)
Alpha-2-Globulin: 1.1 g/dL — ABNORMAL HIGH (ref 0.4–1.0)
Beta Globulin: 1 g/dL (ref 0.7–1.3)
Gamma Globulin: 0.6 g/dL (ref 0.4–1.8)
Globulin, Total: 2.8 g/dL (ref 2.2–3.9)
Total Protein ELP: 5.9 g/dL — ABNORMAL LOW (ref 6.0–8.5)

## 2017-11-19 LAB — APTT
aPTT: 48 seconds — ABNORMAL HIGH (ref 24–36)
aPTT: 57 seconds — ABNORMAL HIGH (ref 24–36)
aPTT: >160 seconds (ref 24–36)

## 2017-11-19 LAB — RENAL FUNCTION PANEL
Albumin: 3.2 g/dL — ABNORMAL LOW (ref 3.5–5.0)
Anion gap: 7 (ref 5–15)
BUN: 45 mg/dL — ABNORMAL HIGH (ref 6–20)
CO2: 28 mmol/L (ref 22–32)
Calcium: 8.6 mg/dL — ABNORMAL LOW (ref 8.9–10.3)
Chloride: 103 mmol/L (ref 98–111)
Creatinine, Ser: 3.7 mg/dL — ABNORMAL HIGH (ref 0.44–1.00)
GFR calc Af Amer: 15 mL/min — ABNORMAL LOW (ref >60)
GFR calc non Af Amer: 13 mL/min — ABNORMAL LOW (ref >60)
Glucose, Bld: 116 mg/dL — ABNORMAL HIGH (ref 70–99)
Phosphorus: 4.6 mg/dL (ref 2.5–4.6)
Potassium: 4.8 mmol/L (ref 3.5–5.1)
Sodium: 138 mmol/L (ref 135–145)

## 2017-11-19 LAB — HEPARIN LEVEL (UNFRACTIONATED): Heparin Unfractionated: 0.45 IU/mL (ref 0.30–0.70)

## 2017-11-19 LAB — GLUCOSE, CAPILLARY
Glucose-Capillary: 101 mg/dL — ABNORMAL HIGH (ref 70–99)
Glucose-Capillary: 121 mg/dL — ABNORMAL HIGH (ref 70–99)
Glucose-Capillary: 89 mg/dL (ref 70–99)
Glucose-Capillary: 79 mg/dL (ref 70–99)

## 2017-11-19 LAB — PHOSPHORUS: Phosphorus: 4.8 mg/dL — ABNORMAL HIGH (ref 2.5–4.6)

## 2017-11-19 LAB — GLOMERULAR BASEMENT MEMBRANE ANTIBODIES: GBM Ab: 4 units (ref 0–20)

## 2017-11-19 MED ORDER — HEPARIN (PORCINE) IN NACL 100-0.45 UNIT/ML-% IJ SOLN
1650.0000 [IU]/h | INTRAMUSCULAR | Status: DC
  Administered 2017-11-19: 1600 [IU]/h via INTRAVENOUS
  Administered 2017-11-20 – 2017-11-21 (×2): 1650 [IU]/h via INTRAVENOUS
  Filled 2017-11-19 (×2): qty 250

## 2017-11-19 NOTE — Progress Notes (Signed)
ANTICOAGULATION CONSULT NOTE - Initial Consult  Pharmacy Consult for heparin Indication: apixaban PTA but admitted with AKI so switching AC to UFH (hx PE 10/2014 but no acute indication noted)  No Known Allergies  Patient Measurements: Height: 5\' 3"  (160 cm) Weight: (!) 338 lb 14.4 oz (153.7 kg) IBW/kg (Calculated) : 52.4 Heparin Dosing Weight: 90.9 kg  Vital Signs: Temp: 98.2 F (36.8 C) (10/07 1923) Temp Source: Oral (10/07 1923) BP: 165/85 (10/07 1923) Pulse Rate: 78 (10/07 1923)  Labs: Recent Labs    11/17/17 0000 11/18/17 0508 11/18/17 0509 11/19/17 0421 11/19/17 1222 11/19/17 2031  HGB 10.5* 10.2*  --  10.2*  --   --   HCT 33.0* 31.8*  --  32.2*  --   --   PLT 198 202  --  215  --   --   APTT 87* 79*  --  48* 57* >160*  HEPARINUNFRC  --  0.95*  --  0.45  --   --   CREATININE 6.30*  --  5.48* 3.70*  --   --     Estimated Creatinine Clearance: 25.5 mL/min (A) (by C-G formula based on SCr of 3.7 mg/dL (H)).   Medical History: Past Medical History:  Diagnosis Date  . Anemia   . Arthritis   . Breast cancer of upper-inner quadrant of right female breast ([place])    Right breast invasive CA and DCIS , T1,N0,M0. Er/PR pos, her 2 negative  . Diabetes mellitus without complication ([place])   . Gout   . Gout   . Hypertension   . Menopause    age 52  . Personal history of radiation therapy   . Pulmonary embolism ([place]) 10/2014  . Renal insufficiency   . Thyroid goiter     Medications:  Infusions:  . sodium chloride 50 mL/hr at 11/18/17 0522  . [START ON 11/20/2017] heparin      Assessment: 38 yof direct admit from oncology clinic for increased serum creatinine. Patient takes apixaban PTA for history of PE in 2016. Due to AKI we will transition to heparin infusion while patient is worked up for AKI.   Goal of Therapy:  Heparin level 0.3-0.7 units/ml aPTT 68 to 109 seconds Monitor platelets by anticoagulation protocol: Yes   HEPARIN COURSE 10/04 @ 1657 aPPT 64  which is slightly subtherapeutic.  Will bolus with 1350 units and then increase rate to 1600 units/hr Check aPTT in 6 hours, monitor HL daily until HL and aPTT correlate per protocol Continue to monitor H&H and platelets 10/05 0000 aPTT 87. Continue current regimen  Recheck aPTT, heparin level, and CBC with 10/6 AM labs. 10/06 AM aPTT 79. Continue current regimen. Recheck aPTT, heparin level and CBC with tomorrow AM labs. 10/7 AM aPTT 48, heparin level 0.45. Increase rate to 1700 units/hr and recheck in 6 hours.  Plan:  10/07 @ 2130 aPTT > 160 seconds. Per RN patient not bleeding. Will drip for 2 hours and will recheck HL/aPTT @ 0400, will assess H/h w/ CBC w/ am labs.  Tobie Lords, PharmD, BCPS Clinical Pharmacist 11/19/2017

## 2017-11-19 NOTE — Progress Notes (Signed)
ANTICOAGULATION CONSULT NOTE - Initial Consult  Pharmacy Consult for heparin Indication: apixaban PTA but admitted with AKI so switching AC to UFH (hx PE 10/2014 but no acute indication noted)  No Known Allergies  Patient Measurements: Height: 5\' 3"  (160 cm) Weight: (!) 338 lb 14.4 oz (153.7 kg) IBW/kg (Calculated) : 52.4 Heparin Dosing Weight: 90.9 kg  Vital Signs: Temp: 98.9 F (37.2 C) (10/07 0451) Temp Source: Oral (10/07 0451) BP: 139/61 (10/07 0451) Pulse Rate: 76 (10/07 0451)  Labs: Recent Labs    11/17/17 0000 11/18/17 0508 11/18/17 0509 11/19/17 0421 11/19/17 1222  HGB 10.5* 10.2*  --  10.2*  --   HCT 33.0* 31.8*  --  32.2*  --   PLT 198 202  --  215  --   APTT 87* 79*  --  48* 57*  HEPARINUNFRC  --  0.95*  --  0.45  --   CREATININE 6.30*  --  5.48* 3.70*  --     Estimated Creatinine Clearance: 25.5 mL/min (A) (by C-G formula based on SCr of 3.7 mg/dL (H)).   Medical History: Past Medical History:  Diagnosis Date  . Anemia   . Arthritis   . Breast cancer of upper-inner quadrant of right female breast ([place])    Right breast invasive CA and DCIS , T1,N0,M0. Er/PR pos, her 2 negative  . Diabetes mellitus without complication ([place])   . Gout   . Gout   . Hypertension   . Menopause    age 52  . Personal history of radiation therapy   . Pulmonary embolism ([place]) 10/2014  . Renal insufficiency   . Thyroid goiter     Medications:  Infusions:  . sodium chloride 50 mL/hr at 11/18/17 0522  . heparin 1,700 Units/hr (11/19/17 0622)    Assessment: 52 yof direct admit from oncology clinic for increased serum creatinine. Patient takes apixaban PTA for history of PE in 2016. Due to AKI we will transition to heparin infusion while patient is worked up for AKI.   Goal of Therapy:  Heparin level 0.3-0.7 units/ml aPTT 68 to 109 seconds Monitor platelets by anticoagulation protocol: Yes   HEPARIN COURSE 10/04 @ 1657 aPPT 64 which is slightly subtherapeutic.   Will bolus with 1350 units and then increase rate to 1600 units/hr Check aPTT in 6 hours, monitor HL daily until HL and aPTT correlate per protocol Continue to monitor H&H and platelets 10/05 0000 aPTT 87. Continue current regimen  Recheck aPTT, heparin level, and CBC with 10/6 AM labs. 10/06 AM aPTT 79. Continue current regimen. Recheck aPTT, heparin level and CBC with tomorrow AM labs. 10/7 AM aPTT 48, heparin level 0.45. Increase rate to 1700 units/hr and recheck in 6 hours.  Plan: 10/7 12:22 aPTT 57. Increase heparin to 1800 units/hr and recheck aPTT in 6 hours.  Tristian Bouska A. Jordan Hawks, PharmD, BCPS  Clinical Pharmacist 11/19/17 1:24 PM

## 2017-11-19 NOTE — Progress Notes (Signed)
ANTICOAGULATION CONSULT NOTE - Initial Consult  Pharmacy Consult for heparin Indication: apixaban PTA but admitted with AKI so switching AC to UFH (hx PE 10/2014 but no acute indication noted)  No Known Allergies  Patient Measurements: Height: 5\' 3"  (160 cm) Weight: (!) 338 lb 14.4 oz (153.7 kg) IBW/kg (Calculated) : 52.4 Heparin Dosing Weight: 90.9 kg  Vital Signs: Temp: 98.9 F (37.2 C) (10/07 0451) Temp Source: Oral (10/07 0451) BP: 139/61 (10/07 0451) Pulse Rate: 76 (10/07 0451)  Labs: Recent Labs    11/16/17 1038  11/17/17 0000 11/18/17 0508 11/18/17 0509 11/19/17 0421  HGB  --    < > 10.5* 10.2*  --  10.2*  HCT  --   --  33.0* 31.8*  --  32.2*  PLT  --   --  198 202  --  215  APTT  --    < > 87* 79*  --  48*  LABPROT 15.8*  --   --   --   --   --   INR 1.27  --   --   --   --   --   HEPARINUNFRC 2.44*  --   --  0.95*  --  0.45  CREATININE  --   --  6.30*  --  5.48* 3.70*   < > = values in this interval not displayed.    Estimated Creatinine Clearance: 25.5 mL/min (A) (by C-G formula based on SCr of 3.7 mg/dL (H)).   Medical History: Past Medical History:  Diagnosis Date  . Anemia   . Arthritis   . Breast cancer of upper-inner quadrant of right female breast ([place])    Right breast invasive CA and DCIS , T1,N0,M0. Er/PR pos, her 2 negative  . Diabetes mellitus without complication ([place])   . Gout   . Gout   . Hypertension   . Menopause    age 52  . Personal history of radiation therapy   . Pulmonary embolism ([place]) 10/2014  . Renal insufficiency   . Thyroid goiter     Medications:  Infusions:  . sodium chloride 50 mL/hr at 11/18/17 0522  . heparin 1,600 Units/hr (11/19/17 0207)    Assessment: 52 yof direct admit from oncology clinic for increased serum creatinine. Patient takes apixaban PTA for history of PE in 2016. Due to AKI we will transition to heparin infusion while patient is worked up for AKI.   Goal of Therapy:  Heparin level 0.3-0.7  units/ml aPTT 68 to 109 seconds Monitor platelets by anticoagulation protocol: Yes   Plan:  10/04 @ 1657 aPPT 64 which is slightly subtherapeutic.  Will bolus with 1350 units and then increase rate to 1600 units/hr Check aPTT in 6 hours, monitor HL daily until HL and aPTT correlate per protocol Continue to monitor H&H and platelets  10/05 0000 aPTT 87. Continue current regimen  Recheck aPTT, heparin level, and CBC with 10/6 AM labs.  10/06 AM aPTT 79. Continue current regimen. Recheck aPTT, heparin level and CBC with tomorrow AM labs.  10/7 AM aPTT 48, heparin level 0.45. Increase rate to 1700 units/hr and recheck in 6 hours.  Sim Boast, PharmD, BCPS  11/19/17 6:11 AM

## 2017-11-19 NOTE — Progress Notes (Signed)
[facility]  ROUNDING NOTE   Subjective:   Emergent hemodialysis yesterday. Tolerated treatment well.   Currently with nonoliguric urine output.   NS at 44mL/hr   Objective:  Vital signs in last 24 hours:  Temp:  [98 F (36.7 C)-99 F (37.2 C)] 98.9 F (37.2 C) (10/07 0451) Pulse Rate:  [74-78] 76 (10/07 0451) Resp:  [19] 19 (10/06 1604) BP: (139-172)/(61-72) 139/61 (10/07 0451) SpO2:  [95 %-98 %] 95 % (10/07 0451) Weight:  [153.7 kg] 153.7 kg (10/07 0451)  Weight change: 0.6 kg Filed Weights   11/18/17 1220 11/18/17 1352 11/19/17 0451  Weight: (!) 155.5 kg (!) 155.4 kg (!) 153.7 kg    Intake/Output: I/O last 3 completed shifts: In: 2834.4 [P.O.:480; I.V.:2354.4] Out: 0    Intake/Output this shift:  No intake/output data recorded.  Physical Exam: General: No acute distress  Head: Normocephalic, atraumatic. Moist oral mucosal membranes  Eyes: Anicteric  Neck: Supple, trachea midline  Lungs:  Clear to auscultation, normal effort  Heart: S1S2 no rubs  Abdomen:  Soft, nontender, bowel sounds present  Extremities: 1+ peripheral edema.  Neurologic: Awake, alert, following commands  Skin: No lesions  Access:  Right temp IJ dialysis catheter 10/6 Dr. Lorenso Courier    Basic Metabolic Panel: Recent Labs  Lab 11/15/17 1500 11/16/17 0405 11/17/17 0000 11/18/17 0509 11/18/17 1235 11/18/17 2335 11/19/17 0421  NA 129* 127* 128* 132*  --   --  138  K 4.9 4.1 5.4* 4.7  --   --  4.8  CL 95* 91* 92* 98  --   --  103  CO2 20* 22 23 24   --   --  28  GLUCOSE 121* 90 100* 111*  --   --  116*  BUN 59* 60* 62* 63*  --   --  45*  CREATININE 4.54* 5.42* 6.30* 5.48*  --   --  3.70*  CALCIUM 9.4 8.3* 8.4* 8.3*  --   --  8.6*  PHOS  --   --   --  7.6* 6.5* 4.8* 4.6    Liver Function Tests: Recent Labs  Lab 11/15/17 1500 11/18/17 0509 11/19/17 0421  AST 37  --   --   ALT 21  --   --   ALKPHOS 115  --   --   BILITOT 0.8  --   --   PROT 7.1  --   --   ALBUMIN  3.9 3.1* 3.2*   No results for input(s): LIPASE, AMYLASE in the last 168 hours. No results for input(s): AMMONIA in the last 168 hours.  CBC: Recent Labs  Lab 11/15/17 1500 11/16/17 0405 11/17/17 0000 11/18/17 0508 11/19/17 0421  WBC 15.1* 13.2* 11.2* 10.7 9.7  NEUTROABS 11.9*  --   --   --   --   HGB 10.3* 9.8* 10.5* 10.2* 10.2*  HCT 32.3* 28.2* 33.0* 31.8* 32.2*  MCV 92.2 91.9 92.4 91.5 92.7  PLT 232 186 198 202 215    Cardiac Enzymes: No results for input(s): CKTOTAL, CKMB, CKMBINDEX, TROPONINI in the last 168 hours.  BNP: Invalid input(s): POCBNP  CBG: Recent Labs  Lab 11/18/17 1131 11/18/17 1638 11/18/17 2045 11/19/17 0742 11/19/17 1149  GLUCAP 112* 120* 92 101* 121*    Microbiology: Results for orders placed or performed during the hospital encounter of 10/18/14  MRSA PCR Screening     Status: None   Collection Time: 10/18/14  8:15 PM  Result Value Ref Range Status   MRSA  by PCR NEGATIVE NEGATIVE Final    Comment:        The GeneXpert MRSA Assay (FDA approved for NASAL specimens only), is one component of a comprehensive MRSA colonization surveillance program. It is not intended to diagnose MRSA infection nor to guide or monitor treatment for MRSA infections.     Coagulation Studies: No results for input(s): LABPROT, INR in the last 72 hours.  Urinalysis: No results for input(s): COLORURINE, LABSPEC, PHURINE, GLUCOSEU, HGBUR, BILIRUBINUR, KETONESUR, PROTEINUR, UROBILINOGEN, NITRITE, LEUKOCYTESUR in the last 72 hours.  Invalid input(s): APPERANCEUR    Imaging: Dg Chest Port 1 View  Result Date: 11/18/2017 CLINICAL DATA:  Status post dialysis catheter placement today. EXAM: PORTABLE CHEST 1 VIEW COMPARISON:  Single-view of the chest 02/28/2016. FINDINGS: Right IJ approach double lumen central venous catheter tip projects near the inferior cavoatrial junction. The line could be withdrawn approximately 4.5 cm for positioning in the right atrium.  No pneumothorax. There is cardiomegaly and mild interstitial edema. Aortic atherosclerosis noted. IMPRESSION: Dialysis catheter tip projects at the inferior cavoatrial junction. The line could be withdrawn approximately 4.5 cm for positioning in the right atrium. No pneumothorax. Cardiomegaly and interstitial pulmonary edema. Electronically Signed   By: Inge Rise M.D.   On: 11/18/2017 11:24     Medications:   . sodium chloride 50 mL/hr at 11/18/17 0522  . heparin 1,800 Units/hr (11/19/17 1417)   . atorvastatin  20 mg Oral Daily  . Chlorhexidine Gluconate Cloth  6 each Topical Q0600  . ferrous sulfate  325 mg Oral BID WC  . folic acid  2,633 mcg Oral Daily  . insulin aspart  0-15 Units Subcutaneous TID WC  . insulin aspart  0-5 Units Subcutaneous QHS  . letrozole  2.5 mg Oral Daily  . levothyroxine  112 mcg Oral QAC breakfast  . sertraline  50 mg Oral Daily  . vitamin C  500 mg Oral BID   acetaminophen **OR** acetaminophen, ALPRAZolam, hydrALAZINE, HYDROcodone-acetaminophen, ondansetron **OR** ondansetron (ZOFRAN) IV, polyethylene glycol, promethazine  Assessment/ Plan:  52 y.o. female  Nicole Schmidt is a 52 y.o. white female with history of breast cancer, diabetes mellitus type 2, gout, hypertension, pulmonary embolism, prior episode of acute renal failure who was admitted now for acute renal failure.  1.  Acute renal failure with hyperkalemia and hyponatremia: on chronic kidney disease stage III with hematuria and glucosuria. Baseline Creatinine of 1.39, GFR of 42 (similar labs reported on 9/25 at Dr. Gerarda Gunther office).  Required emergent hemodialysis treatment on admission 10/6 Unclear cause for acute renal failure. Was previously on hydrochlorothiazide and furosemide. Negative renal ultrasound Chronic Kidney Disease secondary to diabetic nephropathy - No acute indication for dialysis today.  - Monitor daily for dialysis need.  - IV normal saline infusion at  21mL/hr  2. Hypertension: blood pressures are elevated. With peripheral edema. Home regimen of hydrochlorothiazide, furosemide, clonidine - Hold diuretics - With weight gain: check echocardiogram  3. Anemia with kidney failure: normocytic. Hemoglobin 10.2 - ferrous sulfate.   4. Diabetes mellitus type II with chronic kidney disease: hemoglobin A1c of 6.1% on 11/07/17.  - hold metformin.    LOS: 4 Nicole Schmidt 10/7/20192:47 PM

## 2017-11-19 NOTE — Care Management Note (Signed)
Case Management Note  Patient Details  Name: Nicole Schmidt MRN: [id number] Date of Birth: 12-22-1965  Subjective/Objective:     Admitted to [facility] with the diagnosis of acute renal failure, Lives alone. Seen Dr. Rulon Eisenmenger last week. Friend is [name] [phone]). Prescriptions are filled at [facility] and [facility]. Home Health 3 years ago. No skilled nursing. No dialysis in the past. No home oxygen now, acute oxygen 3 years ago. Rasmps, cane, rolling walker, wheelchair, and raised toilet seat in the home. Takes care of all basic activities of daily living herself, drives. Works at [employer]. Lasat fall was January 2018. Fair-good appetite.   Family will transport.          Action/Plan: No follow-up needs per physical therapy. No dialysis at this time per Nephrology.    Expected Discharge Date:                  Expected Discharge Plan:     In-House Referral:   yes  Discharge planning Services   yes  Post Acute Care Choice:    Choice offered to:     DME Arranged:    DME Agency:     HH Arranged:    HH Agency:     Status of Service:     If discussed at [employer] of Stay Meetings, dates discussed:    Additional Comments:  Shelbie Ammons, RN MSN CCM Care Management [phone] 11/19/2017, 1:59 PM

## 2017-11-19 NOTE — Progress Notes (Signed)
Physical Therapy Treatment Patient Details Name: Nicole Schmidt MRN: [id number] DOB: 1965-03-18 Today's Date: 11/19/2017    History of Present Illness Pt is a 52 y.o. female with a known history that includes anemia, breast CA, arthritis, DM, gout, HTN, PE, renal insufficiency, and thyroid goiter presented as a direct admission from oncology's office for noted acute renal failure, creatinine 4.5 up from 1.3 last check, patient stated that she was recently started on clonidine a week or so ago by her primary care provider, recently increased a few days ago, 2 days ago she noted a severe drop in her urination, complains of 1 week of 6 pound weight gain, complains of generalized weakness, fatigue, lack of strength and energy, blood work noted for sodium 129, chloride 95, creatinine 4.5, white count 15,000, patient denied any pain, patient admitted for acute renal failure most likely secondary to medication side effect with hyponatremia secondary to dehydration.  Pt s/p R IJ temp HD cath placement 10/6.    PT Comments    Of note pt s/p R IJ HD cath placement 11/18/17 with Dr. Juleen China contacted who gave verbal approval for pt to participate with PT services including ambulation.  Pt's functional strength and activity tolerance progressing well with pt able to amb 2 x 200 feet this session with no adverse symptoms.  Pt ambulated in hall with a RW and in the room with no AD with slow cadence and short B step length but with good stability throughout the session.  Will continue to keep pt on PT schedule while in acute care to prevent functional decline with pt now initiating HD.  No PT follow up recommended upon discharge.         Follow Up Recommendations  No PT follow up     Equipment Recommendations  None recommended by PT    Recommendations for Other Services       Precautions / Restrictions Precautions Precautions: None Restrictions Weight Bearing Restrictions: No    Mobility  Bed  Mobility               General bed mobility comments: NT, pt in recliner  Transfers Overall transfer level: Independent Equipment used: None Transfers: Sit to/from Stand Sit to Stand: Independent         General transfer comment: Good control and stability during transfers  Ambulation/Gait Ambulation/Gait assistance: Supervision Gait Distance (Feet): 200 Feet Assistive device: Rolling walker (2 wheeled) Gait Pattern/deviations: Step-through pattern;Decreased step length - right;Decreased step length - left Gait velocity: Decreased   General Gait Details: Mildly reduced cadence with amb with good stability and with SpO2 and HR WNL throughout.   Stairs             Wheelchair Mobility    Modified Rankin (Stroke Patients Only)       Balance Overall balance assessment: No apparent balance deficits (not formally assessed)                                          Cognition Arousal/Alertness: Awake/alert Behavior During Therapy: WFL for tasks assessed/performed Overall Cognitive Status: Within Functional Limits for tasks assessed                                        Exercises Total Joint Exercises Ankle Circles/Pumps:  AROM;Both;10 reps Long Arc Quad: Strengthening;Both;10 reps;15 reps Knee Flexion: Strengthening;Both;10 reps;15 reps Marching in Standing: AROM;Both;10 reps;Seated;Standing Other Exercises Other Exercises: Standing weight shifting right/left and marching in place prior to amb    General Comments        Pertinent Vitals/Pain Pain Assessment: No/denies pain    Home Living                      Prior Function            PT Goals (current goals can now be found in the care plan section) Progress towards PT goals: Progressing toward goals    Frequency    Min 2X/week      PT Plan Current plan remains appropriate    Co-evaluation              AM-PAC PT "6 Clicks" Daily  Activity  Outcome Measure                   End of Session Equipment Utilized During Treatment: Gait belt Activity Tolerance: Patient tolerated treatment well Patient left: in chair;with call bell/phone within reach Nurse Communication: Mobility status PT Visit Diagnosis: Muscle weakness (generalized) (M62.81)     Time: 2446-9507 PT Time Calculation (min) (ACUTE ONLY): 24 min  Charges:  $Gait Training: 8-22 mins $Therapeutic Exercise: 8-22 mins                     D. Scott Yeily Link PT, DPT 11/19/17, 3:51 PM

## 2017-11-19 NOTE — Progress Notes (Signed)
Patient ID: Nicole Schmidt, female   DOB: 04/19/1965, 52 y.o.   MRN: [id number]  [facility] PROGRESS NOTE  Nicole Schmidt SJG:[id number] DOB: 1965-05-09 DOA: 11/15/2017 PCP: Lenard Simmer, MD  HPI/Subjective:  Doing better, had her hemodialysis yesterday  Objective: Vitals:   11/18/17 1938 11/19/17 0451  BP: (!) 172/66 139/61  Pulse: 74 76  Resp:    Temp: 99 F (37.2 C) 98.9 F (37.2 C)  SpO2: 98% 95%    Filed Weights   11/18/17 1220 11/18/17 1352 11/19/17 0451  Weight: (!) 155.5 kg (!) 155.4 kg (!) 153.7 kg    ROS: Review of Systems  Constitutional: Positive for malaise/fatigue. Negative for chills and fever.  Eyes: Negative for blurred vision.  Respiratory: Negative for cough and shortness of breath.   Cardiovascular: Negative for chest pain.  Gastrointestinal: Negative for abdominal pain, constipation, diarrhea, nausea and vomiting.  Genitourinary: Negative for dysuria.  Musculoskeletal: Negative for joint pain.  Neurological: Positive for headaches. Negative for dizziness.   Exam: Physical Exam  Constitutional: She is oriented to person, place, and time.  HENT:  Nose: No mucosal edema.  Mouth/Throat: No oropharyngeal exudate or posterior oropharyngeal edema.  Eyes: Pupils are equal, round, and reactive to light. Conjunctivae, EOM and lids are normal.  Neck: No JVD present. Carotid bruit is not present. No edema present. No thyroid mass and no thyromegaly present.  Cardiovascular: S1 normal and S2 normal. Exam reveals no gallop.  No murmur heard. Pulses:      Dorsalis pedis pulses are 2+ on the right side, and 2+ on the left side.  Respiratory: No respiratory distress. She has decreased breath sounds in the right lower field and the left lower field. She has no wheezes. She has no rhonchi. She has no rales.  GI: Soft. Bowel sounds are normal. There is no tenderness.  Musculoskeletal:       Right ankle: She exhibits swelling.       Left ankle: She  exhibits swelling.  Lymphadenopathy:    She has no cervical adenopathy.  Neurological: She is alert and oriented to person, place, and time. No cranial nerve deficit.  Skin: Skin is warm. No rash noted. Nails show no clubbing.  Psychiatric: She has a normal mood and affect.      Data Reviewed: Basic Metabolic Panel: Recent Labs  Lab 11/15/17 1500 11/16/17 0405 11/17/17 0000 11/18/17 0509 11/18/17 1235 11/18/17 2335 11/19/17 0421  NA 129* 127* 128* 132*  --   --  138  K 4.9 4.1 5.4* 4.7  --   --  4.8  CL 95* 91* 92* 98  --   --  103  CO2 20* 22 23 24   --   --  28  GLUCOSE 121* 90 100* 111*  --   --  116*  BUN 59* 60* 62* 63*  --   --  45*  CREATININE 4.54* 5.42* 6.30* 5.48*  --   --  3.70*  CALCIUM 9.4 8.3* 8.4* 8.3*  --   --  8.6*  PHOS  --   --   --  7.6* 6.5* 4.8* 4.6   Liver Function Tests: Recent Labs  Lab 11/15/17 1500 11/18/17 0509 11/19/17 0421  AST 37  --   --   ALT 21  --   --   ALKPHOS 115  --   --   BILITOT 0.8  --   --   PROT 7.1  --   --   ALBUMIN  3.9 3.1* 3.2*   CBC: Recent Labs  Lab 11/15/17 1500 11/16/17 0405 11/17/17 0000 11/18/17 0508 11/19/17 0421  WBC 15.1* 13.2* 11.2* 10.7 9.7  NEUTROABS 11.9*  --   --   --   --   HGB 10.3* 9.8* 10.5* 10.2* 10.2*  HCT 32.3* 28.2* 33.0* 31.8* 32.2*  MCV 92.2 91.9 92.4 91.5 92.7  PLT 232 186 198 202 215    CBG: Recent Labs  Lab 11/18/17 1131 11/18/17 1638 11/18/17 2045 11/19/17 0742 11/19/17 1149  GLUCAP 112* 120* 92 101* 121*     Studies: Dg Chest Port 1 View  Result Date: 11/18/2017 CLINICAL DATA:  Status post dialysis catheter placement today. EXAM: PORTABLE CHEST 1 VIEW COMPARISON:  Single-view of the chest 02/28/2016. FINDINGS: Right IJ approach double lumen central venous catheter tip projects near the inferior cavoatrial junction. The line could be withdrawn approximately 4.5 cm for positioning in the right atrium. No pneumothorax. There is cardiomegaly and mild interstitial edema.  Aortic atherosclerosis noted. IMPRESSION: Dialysis catheter tip projects at the inferior cavoatrial junction. The line could be withdrawn approximately 4.5 cm for positioning in the right atrium. No pneumothorax. Cardiomegaly and interstitial pulmonary edema. Electronically Signed   By: Inge Rise M.D.   On: 11/18/2017 11:24    Scheduled Meds: . atorvastatin  20 mg Oral Daily  . Chlorhexidine Gluconate Cloth  6 each Topical Q0600  . ferrous sulfate  325 mg Oral BID WC  . folic acid  6,387 mcg Oral Daily  . insulin aspart  0-15 Units Subcutaneous TID WC  . insulin aspart  0-5 Units Subcutaneous QHS  . letrozole  2.5 mg Oral Daily  . levothyroxine  112 mcg Oral QAC breakfast  . sertraline  50 mg Oral Daily  . vitamin C  500 mg Oral BID   Continuous Infusions: . sodium chloride 50 mL/hr at 11/18/17 0522  . heparin 1,700 Units/hr (11/19/17 0622)    Assessment/Plan:  1. Acute kidney injury, with nausea vomiting.  Hemodialysis yesterday further hemodialysis treatment per nephrology 2. History of breast cancer on letrozole 3. History of pulmonary embolism on Eliquis as outpatient.  Continue IV heparin for now 4. Hyponatremia secondary to dehydration 5. Depression on Zoloft 6. Hypothyroidism unspecified on levothyroxine 7. Hyperlipidemia unspecified on atorvastatin 8. Morbid obesity with a BMI of 58.58.  Weight loss needed  Code Status:     Code Status Orders  (From admission, onward)         Start     Ordered   11/15/17 2056  Full code  Continuous     11/15/17 2055        Code Status History    Date Active Date Inactive Code Status Order ID Comments User Context   10/18/2014 2221 10/23/2014 2043 Full Code [id number]  Jacqulynn Cadet, MD Inpatient   10/18/2014 2119 10/18/2014 2221 Full Code [id number]  Guy Begin, MD Inpatient    Advance Directive Documentation     Most Recent Value  Type of Advance Directive  Healthcare Power of Attorney, Living will  Pre-existing  out of facility DNR order (yellow form or pink MOST form)  -  "MOST" Form in Place?  -     Family Communication: Family at bedside Disposition Plan: Kidney function will need to be improved prior to disposition  Consultants:  Nephrology  Time spent: 28 minutes  Nayshawn Mesta [facility]

## 2017-11-20 ENCOUNTER — Inpatient Hospital Stay
Admission: AD | Admit: 2017-11-20 | Discharge: 2017-11-20 | Disposition: A | Discharge status: Home / Self Care | Payer: BLUE CROSS/BLUE SHIELD | Source: Ambulatory Visit | Attending: Nephrology | Admitting: Nephrology

## 2017-11-20 ENCOUNTER — Ambulatory Visit: Payer: BLUE CROSS/BLUE SHIELD | Admitting: General Surgery

## 2017-11-20 LAB — CBC
HCT: 32.2 % — ABNORMAL LOW (ref 35.0–47.0)
Hemoglobin: 10.2 g/dL — ABNORMAL LOW (ref 12.0–16.0)
MCH: 29.4 pg (ref 26.0–34.0)
MCHC: 31.5 g/dL — ABNORMAL LOW (ref 32.0–36.0)
MCV: 93.1 fL (ref 80.0–100.0)
Platelets: 218 10*3/uL (ref 150–440)
RBC: 3.46 MIL/uL — ABNORMAL LOW (ref 3.80–5.20)
RDW: 16.4 % — ABNORMAL HIGH (ref 11.5–14.5)
WBC: 11.4 10*3/uL — ABNORMAL HIGH (ref 3.6–11.0)

## 2017-11-20 LAB — RENAL FUNCTION PANEL
Albumin: 3.5 g/dL (ref 3.5–5.0)
Anion gap: 8 (ref 5–15)
BUN: 49 mg/dL — ABNORMAL HIGH (ref 6–20)
CO2: 28 mmol/L (ref 22–32)
Calcium: 9.1 mg/dL (ref 8.9–10.3)
Chloride: 105 mmol/L (ref 98–111)
Creatinine, Ser: 3.16 mg/dL — ABNORMAL HIGH (ref 0.44–1.00)
GFR calc Af Amer: 18 mL/min — ABNORMAL LOW (ref >60)
GFR calc non Af Amer: 16 mL/min — ABNORMAL LOW (ref >60)
Glucose, Bld: 97 mg/dL (ref 70–99)
Phosphorus: 4.2 mg/dL (ref 2.5–4.6)
Potassium: 4.6 mmol/L (ref 3.5–5.1)
Sodium: 141 mmol/L (ref 135–145)

## 2017-11-20 LAB — PROTEIN ELECTRO, RANDOM URINE
Albumin ELP, Urine: 57.7 %
Alpha-1-Globulin, U: 4.4 %
Alpha-2-Globulin, U: 10.2 %
Beta Globulin, U: 18.8 %
Gamma Globulin, U: 8.8 %
Total Protein, Urine: 571.5 mg/dL

## 2017-11-20 LAB — HEPATITIS B SURFACE ANTIGEN: Hepatitis B Surface Ag: NEGATIVE

## 2017-11-20 LAB — ECHOCARDIOGRAM COMPLETE
Height: 63 in
Weight: 5494.4 oz

## 2017-11-20 LAB — HEPARIN LEVEL (UNFRACTIONATED)
Heparin Unfractionated: 0.34 IU/mL (ref 0.30–0.70)
Heparin Unfractionated: 0.45 IU/mL (ref 0.30–0.70)
Heparin Unfractionated: 0.38 IU/mL (ref 0.30–0.70)

## 2017-11-20 LAB — GLUCOSE, CAPILLARY
Glucose-Capillary: 89 mg/dL (ref 70–99)
Glucose-Capillary: 153 mg/dL — ABNORMAL HIGH (ref 70–99)
Glucose-Capillary: 88 mg/dL (ref 70–99)
Glucose-Capillary: 115 mg/dL — ABNORMAL HIGH (ref 70–99)

## 2017-11-20 LAB — PARATHYROID HORMONE, INTACT (NO CA): PTH: 94 pg/mL — ABNORMAL HIGH (ref 15–65)

## 2017-11-20 LAB — HEPATITIS B CORE ANTIBODY, TOTAL: Hep B Core Total Ab: NEGATIVE

## 2017-11-20 LAB — HEPATITIS B SURFACE ANTIBODY, QUANTITATIVE: Hepatitis B-Post: <3.1 m[IU]/mL — ABNORMAL LOW (ref >9.9)

## 2017-11-20 LAB — APTT: aPTT: 44 seconds — ABNORMAL HIGH (ref 24–36)

## 2017-11-20 NOTE — Progress Notes (Signed)
ANTICOAGULATION CONSULT NOTE - Initial Consult  Pharmacy Consult for heparin Indication: apixaban PTA but admitted with AKI so switching AC to UFH (hx PE 10/2014 but no acute indication noted)  No Known Allergies  Patient Measurements: Height: 5\' 3"  (160 cm) Weight: (!) 343 lb 6.4 oz (155.8 kg) IBW/kg (Calculated) : 52.4 Heparin Dosing Weight: 90.9 kg  Vital Signs: Temp: 98.1 F (36.7 C) (10/08 1505) Temp Source: Oral (10/08 1505) BP: 160/84 (10/08 1800) Pulse Rate: 76 (10/08 1637)  Labs: Recent Labs    11/18/17 0508 11/18/17 0509 11/19/17 0421 11/19/17 1222 11/19/17 2031 11/20/17 0401 11/20/17 1328 11/20/17 1946  HGB 10.2*  --  10.2*  --   --  10.2*  --   --   HCT 31.8*  --  32.2*  --   --  32.2*  --   --   PLT 202  --  215  --   --  218  --   --   APTT 79*  --  48* 57* >160* 44*  --   --   HEPARINUNFRC 0.95*  --  0.45  --   --  0.34 0.45 0.38  CREATININE  --  5.48* 3.70*  --   --  3.16*  --   --     Estimated Creatinine Clearance: 30.1 mL/min (A) (by C-G formula based on SCr of 3.16 mg/dL (H)).   Medical History: Past Medical History:  Diagnosis Date  . Anemia   . Arthritis   . Breast cancer of upper-inner quadrant of right female breast ([place])    Right breast invasive CA and DCIS , T1,N0,M0. Er/PR pos, her 2 negative  . Diabetes mellitus without complication ([place])   . Gout   . Gout   . Hypertension   . Menopause    age 37  . Personal history of radiation therapy   . Pulmonary embolism ([place]) 10/2014  . Renal insufficiency   . Thyroid goiter     Medications:  Infusions:  . heparin 1,650 Units/hr (11/20/17 1230)    Assessment: 44 yof direct admit from oncology clinic for increased serum creatinine. Patient takes apixaban PTA for history of PE in 2016. Due to AKI we will transition to heparin infusion while patient is worked up for AKI.   Goal of Therapy:  Heparin level 0.3-0.7 units/ml aPTT 68 to 109 seconds Monitor platelets by anticoagulation  protocol: Yes   HEPARIN COURSE 10/04 @ 1657 aPPT 64 which is slightly subtherapeutic.  Will bolus with 1350 units and then increase rate to 1600 units/hr Check aPTT in 6 hours, monitor HL daily until HL and aPTT correlate per protocol Continue to monitor H&H and platelets 10/05 0000 aPTT 87. Continue current regimen  Recheck aPTT, heparin level, and CBC with 10/6 AM labs. 10/06 AM aPTT 79. Continue current regimen. Recheck aPTT, heparin level and CBC with tomorrow AM labs. 10/7 AM aPTT 48, heparin level 0.45. Increase rate to 1700 units/hr and recheck in 6 hours. 10/08 @ 0500 aPTT 44 seconds, HL 0.34. Levels correlating, will dose off of HL. Will increase rate slightly to 1650 units/hr and will recheck HL @ 1200. CBC stable. 10/8 13:28 HL therapeutic x 1. Continue current rate and recheck HL in 6 hours.  10/8 1946 2nd HL therapeutic   Plan:  Continue heparin infusion at current rate. Will check heparin levels beginning with am labs. Continue to monitor H&H and platelets  Vallery Sa, PharmD Clinical Pharmacist 11/20/2017

## 2017-11-20 NOTE — Progress Notes (Signed)
Physical Therapy Treatment Patient Details Name: Nicole Schmidt MRN: [id number] DOB: [birth year] Today's Date: 11/20/2017    History of Present Illness Pt is a 52 y.o. female with a known history that includes anemia, breast CA, arthritis, DM, gout, HTN, PE, renal insufficiency, and thyroid goiter presented as a direct admission from oncology's office for noted acute renal failure, creatinine 4.5 up from 1.3 last check, patient stated that she was recently started on clonidine a week or so ago by her primary care provider, recently increased a few days ago, 2 days ago she noted a severe drop in her urination, complains of 1 week of 6 pound weight gain, complains of generalized weakness, fatigue, lack of strength and energy, blood work noted for sodium 129, chloride 95, creatinine 4.5, white count 15,000, patient denied any pain, patient admitted for acute renal failure most likely secondary to medication side effect with hyponatremia secondary to dehydration.  Pt s/p R IJ temp HD cath placement 10/6.    PT Comments    Initial attempt, pt in the bathroom. Second attempt, pt agreeable to PT. PT demonstrating improved endurance and quality of ambulation to subjectively near baseline. Pt demonstrates all mobility safely without use of an AD. Review of exercises and encouragement to perform several times per day (pt recently received outpt PT and is familiar). Pt remains up in chair comfortably. Continue PT to progress overall strength and endurance for improved functional mobility tolerance during healing process from recent acute illness.    Follow Up Recommendations  No PT follow up     Equipment Recommendations  None recommended by PT    Recommendations for Other Services       Precautions / Restrictions Precautions Precautions: None Restrictions Weight Bearing Restrictions: No    Mobility  Bed Mobility               General bed mobility comments: Not tested; up in  chair  Transfers Overall transfer level: Independent Equipment used: None Transfers: Sit to/from Stand Sit to Stand: Independent         General transfer comment: No issues  Ambulation/Gait Ambulation/Gait assistance: Supervision Gait Distance (Feet): 400 Feet Assistive device: None Gait Pattern/deviations: WFL(Within Functional Limits)     General Gait Details: Subjectively near baseline; 1 stand rest pause for 30 seconds   Stairs             Wheelchair Mobility    Modified Rankin (Stroke Patients Only)       Balance Overall balance assessment: No apparent balance deficits (not formally assessed)                                          Cognition Arousal/Alertness: Awake/alert Behavior During Therapy: WFL for tasks assessed/performed Overall Cognitive Status: Within Functional Limits for tasks assessed                                        Exercises Other Exercises Other Exercises: Reviewed seated and stand exercises for AP, march, FAQ, SLR, hip ABD, hip ext with single UE support for stand. Pt familiar with exercises, as she recently participated in outpt PT at [place]        Pertinent Vitals/Pain Pain Assessment: No/denies pain    Home Living  Prior Function            PT Goals (current goals can now be found in the care plan section) Progress towards PT goals: Progressing toward goals    Frequency    Min 2X/week      PT Plan Current plan remains appropriate    Co-evaluation              AM-PAC PT "6 Clicks" Daily Activity  Outcome Measure  Difficulty turning over in bed (including adjusting bedclothes, sheets and blankets)?: None Difficulty moving from lying on back to sitting on the side of the bed? : None Difficulty sitting down on and standing up from a chair with arms (e.g., wheelchair, bedside commode, etc,.)?: None Help needed moving  to and from a bed to chair (including a wheelchair)?: None Help needed walking in hospital room?: None Help needed climbing 3-5 steps with a railing? : A Little 6 Click Score: 23    End of Session   Activity Tolerance: Patient tolerated treatment well Patient left: in chair;with call bell/phone within reach   PT Visit Diagnosis: Muscle weakness (generalized) (M62.81)     Time: 1637-1700 PT Time Calculation (min) (ACUTE ONLY): 23 min  Charges:  $Gait Training: 8-22 mins $Therapeutic Exercise: 8-22 mins                      Larae Grooms, PTA 11/20/2017, 5:08 PM

## 2017-11-20 NOTE — Progress Notes (Signed)
Patient ID: Nicole Schmidt, female   DOB: Oct 04, 1965, 52 y.o.   MRN: [id number]  [facility] PROGRESS NOTE  SWAY GUTTIERREZ ZHY:[id number] DOB: [birth year] DOA: 11/15/2017 PCP: Lenard Simmer, MD  HPI/Subjective: Patient denies any complaints renal function improving   Objective: Vitals:   11/19/17 1923 11/20/17 0502  BP: (!) 165/85 (!) 178/71  Pulse: 78 83  Resp: 18 20  Temp: 98.2 F (36.8 C) 98.1 F (36.7 C)  SpO2: 100% 95%    Filed Weights   11/18/17 1352 11/19/17 0451 11/20/17 0502  Weight: (!) 155.4 kg (!) 153.7 kg (!) 155.8 kg    ROS: Review of Systems  Constitutional: Positive for malaise/fatigue. Negative for chills and fever.  Eyes: Negative for blurred vision.  Respiratory: Negative for cough and shortness of breath.   Cardiovascular: Negative for chest pain.  Gastrointestinal: Negative for abdominal pain, constipation, diarrhea, nausea and vomiting.  Genitourinary: Negative for dysuria.  Musculoskeletal: Negative for joint pain.  Neurological: Positive for headaches. Negative for dizziness.   Exam: Physical Exam  Constitutional: She is oriented to person, place, and time.  HENT:  Nose: No mucosal edema.  Mouth/Throat: No oropharyngeal exudate or posterior oropharyngeal edema.  Eyes: Pupils are equal, round, and reactive to light. Conjunctivae, EOM and lids are normal.  Neck: No JVD present. Carotid bruit is not present. No edema present. No thyroid mass and no thyromegaly present.  Cardiovascular: S1 normal and S2 normal. Exam reveals no gallop.  No murmur heard. Pulses:      Dorsalis pedis pulses are 2+ on the right side, and 2+ on the left side.  Respiratory: No respiratory distress. She has decreased breath sounds in the right lower field and the left lower field. She has no wheezes. She has no rhonchi. She has no rales.  GI: Soft. Bowel sounds are normal. There is no tenderness.  Musculoskeletal:       Right ankle: She exhibits swelling.   Left ankle: She exhibits swelling.  Lymphadenopathy:    She has no cervical adenopathy.  Neurological: She is alert and oriented to person, place, and time. No cranial nerve deficit.  Skin: Skin is warm. No rash noted. Nails show no clubbing.  Psychiatric: She has a normal mood and affect.      Data Reviewed: Basic Metabolic Panel: Recent Labs  Lab 11/16/17 0405 11/17/17 0000 11/18/17 0509 11/18/17 1235 11/18/17 2335 11/19/17 0421 11/20/17 0401  NA 127* 128* 132*  --   --  138 141  K 4.1 5.4* 4.7  --   --  4.8 4.6  CL 91* 92* 98  --   --  103 105  CO2 22 23 24   --   --  28 28  GLUCOSE 90 100* 111*  --   --  116* 97  BUN 60* 62* 63*  --   --  45* 49*  CREATININE 5.42* 6.30* 5.48*  --   --  3.70* 3.16*  CALCIUM 8.3* 8.4* 8.3*  --   --  8.6* 9.1  PHOS  --   --  7.6* 6.5* 4.8* 4.6 4.2   Liver Function Tests: Recent Labs  Lab 11/15/17 1500 11/18/17 0509 11/19/17 0421 11/20/17 0401  AST 37  --   --   --   ALT 21  --   --   --   ALKPHOS 115  --   --   --   BILITOT 0.8  --   --   --   PROT  7.1  --   --   --   ALBUMIN 3.9 3.1* 3.2* 3.5   CBC: Recent Labs  Lab 11/15/17 1500 11/16/17 0405 11/17/17 0000 11/18/17 0508 11/19/17 0421 11/20/17 0401  WBC 15.1* 13.2* 11.2* 10.7 9.7 11.4*  NEUTROABS 11.9*  --   --   --   --   --   HGB 10.3* 9.8* 10.5* 10.2* 10.2* 10.2*  HCT 32.3* 28.2* 33.0* 31.8* 32.2* 32.2*  MCV 92.2 91.9 92.4 91.5 92.7 93.1  PLT 232 186 198 202 215 218    CBG: Recent Labs  Lab 11/19/17 1149 11/19/17 1635 11/19/17 2131 11/20/17 0730 11/20/17 1215  GLUCAP 121* 89 79 89 153*     Studies: No results found.  Scheduled Meds: . atorvastatin  20 mg Oral Daily  . Chlorhexidine Gluconate Cloth  6 each Topical Q0600  . ferrous sulfate  325 mg Oral BID WC  . folic acid  0,923 mcg Oral Daily  . insulin aspart  0-15 Units Subcutaneous TID WC  . insulin aspart  0-5 Units Subcutaneous QHS  . letrozole  2.5 mg Oral Daily  . levothyroxine  112 mcg  Oral QAC breakfast  . sertraline  50 mg Oral Daily  . vitamin C  500 mg Oral BID   Continuous Infusions: . heparin 1,650 Units/hr (11/20/17 1230)    Assessment/Plan:  1. Acute kidney injury, with nausea vomiting.  HD on hold renal function continues to improve we will discharge tomorrow 2. History of breast cancer on letrozole 3. History of pulmonary embolism on Eliquis as outpatient.  Continue IV heparin for now 4. Hyponatremia secondary to dehydration 5. Depression on Zoloft 6. Hypothyroidism unspecified on levothyroxine 7. Hyperlipidemia unspecified on atorvastatin 8. Morbid obesity with a BMI of 58.58.  Weight loss needed  Code Status:     Code Status Orders  (From admission, onward)         Start     Ordered   11/15/17 2056  Full code  Continuous     11/15/17 2055        Code Status History    Date Active Date Inactive Code Status Order ID Comments User Context   10/18/2014 2221 10/23/2014 2043 Full Code [id number]  Jacqulynn Cadet, MD Inpatient   10/18/2014 2119 10/18/2014 2221 Full Code [id number]  Guy Begin, MD Inpatient    Advance Directive Documentation     Most Recent Value  Type of Advance Directive  Healthcare Power of Attorney, Living will  Pre-existing out of facility DNR order (yellow form or pink MOST form)  -  "MOST" Form in Place?  -     Family Communication: Discussed with the patient Disposition Plan: Possible tomorrow  Consultants:  Nephrology  Time spent: 28 minutes  Tyrhonda Georgiades [facility]

## 2017-11-20 NOTE — Progress Notes (Signed)
ANTICOAGULATION CONSULT NOTE - Initial Consult  Pharmacy Consult for heparin Indication: apixaban PTA but admitted with AKI so switching AC to UFH (hx PE 10/2014 but no acute indication noted)  No Known Allergies  Patient Measurements: Height: 5\' 3"  (160 cm) Weight: (!) 343 lb 6.4 oz (155.8 kg) IBW/kg (Calculated) : 52.4 Heparin Dosing Weight: 90.9 kg  Vital Signs: Temp: 98.1 F (36.7 C) (10/08 0502) Temp Source: Oral (10/08 0502) BP: 178/71 (10/08 0502) Pulse Rate: 83 (10/08 0502)  Labs: Recent Labs    11/18/17 0508 11/18/17 0509 11/19/17 0421 11/19/17 1222 11/19/17 2031 11/20/17 0401 11/20/17 1328  HGB 10.2*  --  10.2*  --   --  10.2*  --   HCT 31.8*  --  32.2*  --   --  32.2*  --   PLT 202  --  215  --   --  218  --   APTT 79*  --  48* 57* >160* 44*  --   HEPARINUNFRC 0.95*  --  0.45  --   --  0.34 0.45  CREATININE  --  5.48* 3.70*  --   --  3.16*  --     Estimated Creatinine Clearance: 30.1 mL/min (A) (by C-G formula based on SCr of 3.16 mg/dL (H)).   Medical History: Past Medical History:  Diagnosis Date  . Anemia   . Arthritis   . Breast cancer of upper-inner quadrant of right female breast ([place])    Right breast invasive CA and DCIS , T1,N0,M0. Er/PR pos, her 2 negative  . Diabetes mellitus without complication ([place])   . Gout   . Gout   . Hypertension   . Menopause    age 52  . Personal history of radiation therapy   . Pulmonary embolism ([employer]) 10/2014  . Renal insufficiency   . Thyroid goiter     Medications:  Infusions:  . heparin 1,650 Units/hr (11/20/17 1230)    Assessment: 52 yof direct admit from oncology clinic for increased serum creatinine. Patient takes apixaban PTA for history of PE in 2016. Due to AKI we will transition to heparin infusion while patient is worked up for AKI.   Goal of Therapy:  Heparin level 0.3-0.7 units/ml aPTT 68 to 109 seconds Monitor platelets by anticoagulation protocol: Yes   HEPARIN COURSE 10/04 @ 1657  aPPT 64 which is slightly subtherapeutic.  Will bolus with 1350 units and then increase rate to 1600 units/hr Check aPTT in 6 hours, monitor HL daily until HL and aPTT correlate per protocol Continue to monitor H&H and platelets 10/05 0000 aPTT 87. Continue current regimen  Recheck aPTT, heparin level, and CBC with 10/6 AM labs. 10/06 AM aPTT 79. Continue current regimen. Recheck aPTT, heparin level and CBC with tomorrow AM labs. 10/7 AM aPTT 48, heparin level 0.45. Increase rate to 1700 units/hr and recheck in 6 hours. 10/08 @ 0500 aPTT 44 seconds, HL 0.34. Levels correlating, will dose off of HL. Will increase rate slightly to 1650 units/hr and will recheck HL @ 1200. CBC stable.    Plan: 10/8 13:28 HL therapeutic x 1. Continue current rate and recheck HL in 6 hours.   Zelphia Glover A. Jordan Hawks, PharmD, BCPS Clinical Pharmacist 11/20/2017

## 2017-11-20 NOTE — Progress Notes (Signed)
[facility]  ROUNDING NOTE   Subjective:   Patient states she is urinating more.  No other complaints.   Creatinine 3.16 (3.7)  Objective:  Vital signs in last 24 hours:  Temp:  [97.4 F (36.3 C)-98.2 F (36.8 C)] 98.1 F (36.7 C) (10/08 0502) Pulse Rate:  [78-83] 83 (10/08 0502) Resp:  [16-20] 20 (10/08 0502) BP: (155-178)/(58-85) 178/71 (10/08 0502) SpO2:  [95 %-100 %] 95 % (10/08 0502) Weight:  [155.8 kg] 155.8 kg (10/08 0502)  Weight change: 0.265 kg Filed Weights   11/18/17 1352 11/19/17 0451 11/20/17 0502  Weight: (!) 155.4 kg (!) 153.7 kg (!) 155.8 kg    Intake/Output: I/O last 3 completed shifts: In: 94.5 [I.V.:94.5] Out: -    Intake/Output this shift:  No intake/output data recorded.  Physical Exam: General: No acute distress  Head: Normocephalic, atraumatic. Moist oral mucosal membranes  Eyes: Anicteric  Neck: Supple, trachea midline  Lungs:  Clear to auscultation, normal effort  Heart: S1S2 no rubs  Abdomen:  Soft, nontender, bowel sounds present  Extremities: trace peripheral edema.  Neurologic: Awake, alert, following commands  Skin: No lesions  Access:  Right temp IJ dialysis catheter 10/6 Dr. Lorenso Courier    Basic Metabolic Panel: Recent Labs  Lab 11/16/17 0405 11/17/17 0000 11/18/17 0509 11/18/17 1235 11/18/17 2335 11/19/17 0421 11/20/17 0401  NA 127* 128* 132*  --   --  138 141  K 4.1 5.4* 4.7  --   --  4.8 4.6  CL 91* 92* 98  --   --  103 105  CO2 22 23 24   --   --  28 28  GLUCOSE 90 100* 111*  --   --  116* 97  BUN 60* 62* 63*  --   --  45* 49*  CREATININE 5.42* 6.30* 5.48*  --   --  3.70* 3.16*  CALCIUM 8.3* 8.4* 8.3*  --   --  8.6* 9.1  PHOS  --   --  7.6* 6.5* 4.8* 4.6 4.2    Liver Function Tests: Recent Labs  Lab 11/15/17 1500 11/18/17 0509 11/19/17 0421 11/20/17 0401  AST 37  --   --   --   ALT 21  --   --   --   ALKPHOS 115  --   --   --   BILITOT 0.8  --   --   --   PROT 7.1  --   --   --   ALBUMIN 3.9  3.1* 3.2* 3.5   No results for input(s): LIPASE, AMYLASE in the last 168 hours. No results for input(s): AMMONIA in the last 168 hours.  CBC: Recent Labs  Lab 11/15/17 1500 11/16/17 0405 11/17/17 0000 11/18/17 0508 11/19/17 0421 11/20/17 0401  WBC 15.1* 13.2* 11.2* 10.7 9.7 11.4*  NEUTROABS 11.9*  --   --   --   --   --   HGB 10.3* 9.8* 10.5* 10.2* 10.2* 10.2*  HCT 32.3* 28.2* 33.0* 31.8* 32.2* 32.2*  MCV 92.2 91.9 92.4 91.5 92.7 93.1  PLT 232 186 198 202 215 218    Cardiac Enzymes: No results for input(s): CKTOTAL, CKMB, CKMBINDEX, TROPONINI in the last 168 hours.  BNP: Invalid input(s): POCBNP  CBG: Recent Labs  Lab 11/19/17 0742 11/19/17 1149 11/19/17 1635 11/19/17 2131 11/20/17 0730  GLUCAP 101* 121* 89 79 89    Microbiology: Results for orders placed or performed during the hospital encounter of 10/18/14  MRSA PCR Screening  Status: None   Collection Time: 10/18/14  8:15 PM  Result Value Ref Range Status   MRSA by PCR NEGATIVE NEGATIVE Final    Comment:        The GeneXpert MRSA Assay (FDA approved for NASAL specimens only), is one component of a comprehensive MRSA colonization surveillance program. It is not intended to diagnose MRSA infection nor to guide or monitor treatment for MRSA infections.     Coagulation Studies: No results for input(s): LABPROT, INR in the last 72 hours.  Urinalysis: No results for input(s): COLORURINE, LABSPEC, PHURINE, GLUCOSEU, HGBUR, BILIRUBINUR, KETONESUR, PROTEINUR, UROBILINOGEN, NITRITE, LEUKOCYTESUR in the last 72 hours.  Invalid input(s): APPERANCEUR    Imaging: No results found.   Medications:   . heparin 1,650 Units/hr (11/20/17 1025)   . atorvastatin  20 mg Oral Daily  . Chlorhexidine Gluconate Cloth  6 each Topical Q0600  . ferrous sulfate  325 mg Oral BID WC  . folic acid  7,106 mcg Oral Daily  . insulin aspart  0-15 Units Subcutaneous TID WC  . insulin aspart  0-5 Units Subcutaneous QHS   . letrozole  2.5 mg Oral Daily  . levothyroxine  112 mcg Oral QAC breakfast  . sertraline  50 mg Oral Daily  . vitamin C  500 mg Oral BID   acetaminophen **OR** acetaminophen, ALPRAZolam, hydrALAZINE, HYDROcodone-acetaminophen, ondansetron **OR** ondansetron (ZOFRAN) IV, polyethylene glycol, promethazine  Assessment/ Plan:  [name] is a 52 y.o. white female with history of breast cancer, diabetes mellitus type 2, gout, hypertension, pulmonary embolism, prior episode of acute renal failure who was admitted now for acute renal failure.  1.  Acute renal failure with hyperkalemia and hyponatremia: on chronic kidney disease stage III with hematuria and glucosuria. Baseline Creatinine of 1.39, GFR of 42 (similar labs reported on 9/25 at Dr. Gerarda Gunther office).  Required emergent hemodialysis treatment on admission 10/6 Unclear cause for acute renal failure. Was previously on hydrochlorothiazide and furosemide. Negative renal ultrasound Chronic Kidney Disease secondary to diabetic nephropathy - Discontinue IV fluids - Remove dialysis catheter - Encourage PO intake.  - Holding hydrochlorothiazide and furosemide.   2. Hypertension: blood pressures are elevated. With peripheral edema. Home regimen of hydrochlorothiazide, furosemide, clonidine - Pending echocardiogram.   3. Anemia with kidney failure: normocytic. Hemoglobin 10.2 - ferrous sulfate.   4. Diabetes mellitus type II with chronic kidney disease: hemoglobin A1c of 6.1% on 11/07/17.  - hold metformin.    LOS: 5 Nicole Schmidt 10/8/201911:25 AM

## 2017-11-20 NOTE — Progress Notes (Signed)
*  PRELIMINARY RESULTS* Echocardiogram 2D Echocardiogram has been performed.  Sherrie Sport 11/20/2017, 9:54 AM

## 2017-11-20 NOTE — Progress Notes (Signed)
ANTICOAGULATION CONSULT NOTE - Initial Consult  Pharmacy Consult for heparin Indication: apixaban PTA but admitted with AKI so switching AC to UFH (hx PE 10/2014 but no acute indication noted)  No Known Allergies  Patient Measurements: Height: 5\' 3"  (160 cm) Weight: (!) 343 lb 6.4 oz (155.8 kg) IBW/kg (Calculated) : 52.4 Heparin Dosing Weight: 90.9 kg  Vital Signs: Temp: 98.1 F (36.7 C) (10/08 0502) Temp Source: Oral (10/08 0502) BP: 178/71 (10/08 0502) Pulse Rate: 83 (10/08 0502)  Labs: Recent Labs    11/18/17 0508 11/18/17 0509 11/19/17 0421 11/19/17 1222 11/19/17 2031 11/20/17 0401  HGB 10.2*  --  10.2*  --   --  10.2*  HCT 31.8*  --  32.2*  --   --  32.2*  PLT 202  --  215  --   --  218  APTT 79*  --  48* 57* >160* 44*  HEPARINUNFRC 0.95*  --  0.45  --   --  0.34  CREATININE  --  5.48* 3.70*  --   --  3.16*    Estimated Creatinine Clearance: 30.1 mL/min (A) (by C-G formula based on SCr of 3.16 mg/dL (H)).   Medical History: Past Medical History:  Diagnosis Date  . Anemia   . Arthritis   . Breast cancer of upper-inner quadrant of right female breast ([place])    Right breast invasive CA and DCIS , T1,N0,M0. Er/PR pos, her 2 negative  . Diabetes mellitus without complication ([employer])   . Gout   . Gout   . Hypertension   . Menopause    age 52  . Personal history of radiation therapy   . Pulmonary embolism ([place]) 10/2014  . Renal insufficiency   . Thyroid goiter     Medications:  Infusions:  . sodium chloride 50 mL/hr at 11/20/17 0206  . heparin 1,600 Units/hr (11/19/17 2352)    Assessment: 52 yof direct admit from oncology clinic for increased serum creatinine. Patient takes apixaban PTA for history of PE in 2016. Due to AKI we will transition to heparin infusion while patient is worked up for AKI.   Goal of Therapy:  Heparin level 0.3-0.7 units/ml aPTT 68 to 109 seconds Monitor platelets by anticoagulation protocol: Yes   HEPARIN COURSE 10/04 @ 1657  aPPT 64 which is slightly subtherapeutic.  Will bolus with 1350 units and then increase rate to 1600 units/hr Check aPTT in 6 hours, monitor HL daily until HL and aPTT correlate per protocol Continue to monitor H&H and platelets 10/05 0000 aPTT 87. Continue current regimen  Recheck aPTT, heparin level, and CBC with 10/6 AM labs. 10/06 AM aPTT 79. Continue current regimen. Recheck aPTT, heparin level and CBC with tomorrow AM labs. 10/7 AM aPTT 48, heparin level 0.45. Increase rate to 1700 units/hr and recheck in 6 hours.  Plan:  10/08 @ 0500 aPTT 44 seconds, HL 0.34. Levels correlating, will dose off of HL. Will increase rate slightly to 1650 units/hr and will recheck HL @ 1200. CBC stable.  Tobie Lords, PharmD, BCPS Clinical Pharmacist 11/20/2017

## 2017-11-21 LAB — RENAL FUNCTION PANEL
Albumin: 3.4 g/dL — ABNORMAL LOW (ref 3.5–5.0)
Anion gap: 9 (ref 5–15)
BUN: 46 mg/dL — ABNORMAL HIGH (ref 6–20)
CO2: 28 mmol/L (ref 22–32)
Calcium: 9.2 mg/dL (ref 8.9–10.3)
Chloride: 106 mmol/L (ref 98–111)
Creatinine, Ser: 2.62 mg/dL — ABNORMAL HIGH (ref 0.44–1.00)
GFR calc Af Amer: 23 mL/min — ABNORMAL LOW (ref >60)
GFR calc non Af Amer: 20 mL/min — ABNORMAL LOW (ref >60)
Glucose, Bld: 106 mg/dL — ABNORMAL HIGH (ref 70–99)
Phosphorus: 4.2 mg/dL (ref 2.5–4.6)
Potassium: 5 mmol/L (ref 3.5–5.1)
Sodium: 143 mmol/L (ref 135–145)

## 2017-11-21 LAB — HEPARIN LEVEL (UNFRACTIONATED): Heparin Unfractionated: 0.39 IU/mL (ref 0.30–0.70)

## 2017-11-21 LAB — GLUCOSE, CAPILLARY: Glucose-Capillary: 82 mg/dL (ref 70–99)

## 2017-11-21 MED ORDER — APIXABAN 5 MG PO TABS
5.0000 mg | ORAL_TABLET | Freq: Two times a day (BID) | ORAL | Status: DC
  Administered 2017-11-21: 5 mg via ORAL
  Filled 2017-11-21: qty 1

## 2017-11-21 NOTE — Discharge Summary (Signed)
[facility] - [place] at [facility], 52 y.o., DOB 01/20/1966, MRN [id number]. Admission date: 11/15/2017 Discharge Date 11/21/2017 Primary MD Lenard Simmer, MD Admitting Physician Hillary Bow, MD  Admission Diagnosis  AKI  Discharge Diagnosis   Active Problems:  Acute renal failure with hyperkalemia and hyponatremia: on chronic kidney disease stage III with hematuria and glucosuria Essential hypertension History of pulmonary embolism Hyponatremia Depression Hypothyroidism Hyperlipidemia Morbid obesity      Hospital Course  Nicole Schmidt  is a 52 y.o. female with a known history per below presenting as a direct admission from Dr. Corcoran/oncology's office today for noted acute renal failure, creatinine 4.5 up from 1.3.  Patient was admitted for acute renal failure on chronic kidney disease.  Patient was given IV fluids her renal function did not show improvement therefore had to receive 1 time dialysis.  Since then her kidney function has now improving.  And is going back to baseline.  Patient doing much better and stable for discharge.  She will need a BMP checked next week and follow-up with primary care provider and nephrology          Consults  nephrology  Significant Tests:  See full reports for all details     US Renal  Result Date: 11/16/2017 CLINICAL DATA:  Acute renal failure EXAM: RENAL / URINARY TRACT ULTRASOUND COMPLETE COMPARISON:  Ultrasound 10/20/2014 FINDINGS: Right Kidney: Length: 11.9 cm. Echogenicity within normal limits. No mass or hydronephrosis visualized. Left Kidney: Length: 13.4 cm. Echogenicity within normal limits. No mass or hydronephrosis visualized. Bladder: Appears normal for degree of bladder distention. IMPRESSION: No acute findings.  No hydronephrosis. Electronically Signed   By: Rolm Baptise M.D.   On: 11/16/2017 09:48   Dg Chest Port 1 View  Result Date: 11/18/2017 CLINICAL DATA:  Status post dialysis  catheter placement today. EXAM: PORTABLE CHEST 1 VIEW COMPARISON:  Single-view of the chest 02/28/2016. FINDINGS: Right IJ approach double lumen central venous catheter tip projects near the inferior cavoatrial junction. The line could be withdrawn approximately 4.5 cm for positioning in the right atrium. No pneumothorax. There is cardiomegaly and mild interstitial edema. Aortic atherosclerosis noted. IMPRESSION: Dialysis catheter tip projects at the inferior cavoatrial junction. The line could be withdrawn approximately 4.5 cm for positioning in the right atrium. No pneumothorax. Cardiomegaly and interstitial pulmonary edema. Electronically Signed   By: Inge Rise M.D.   On: 11/18/2017 11:24       Today   Subjective:   Nicole Schmidt  Pt doing well denies any complaint  Objective:   Blood pressure (!) 158/62, pulse 83, temperature 98.3 F (36.8 C), resp. rate 19, height 5\' 3"  (1.6 m), weight (!) 155.8 kg, last menstrual period 07/17/2012, SpO2 96 %.  .  Intake/Output Summary (Last 24 hours) at 11/21/2017 1141 Last data filed at 11/21/2017 1015 Gross per 24 hour  Intake 1109.05 ml  Output -  Net 1109.05 ml    Exam VITAL SIGNS: Blood pressure (!) 158/62, pulse 83, temperature 98.3 F (36.8 C), resp. rate 19, height 5\' 3"  (1.6 m), weight (!) 155.8 kg, last menstrual period 07/17/2012, SpO2 96 %.  GENERAL:  51 y.o.-year-old patient lying in the bed with no acute distress.  EYES: Pupils equal, round, reactive to light and accommodation. No scleral icterus. Extraocular muscles intact.  HEENT: Head atraumatic, normocephalic. Oropharynx and nasopharynx clear.  NECK:  Supple, no jugular venous distention. No thyroid enlargement, no tenderness.  LUNGS: Normal breath sounds  bilaterally, no wheezing, rales,rhonchi or crepitation. No use of accessory muscles of respiration.  CARDIOVASCULAR: S1, S2 normal. No murmurs, rubs, or gallops.  ABDOMEN: Soft, nontender, nondistended. Bowel sounds  present. No organomegaly or mass.  EXTREMITIES: No pedal edema, cyanosis, or clubbing.  NEUROLOGIC: Cranial nerves II through XII are intact. Muscle strength 5/5 in all extremities. Sensation intact. Gait not checked.  PSYCHIATRIC: The patient is alert and oriented x 3.  SKIN: No obvious rash, lesion, or ulcer.   Data Review     CBC w Diff:  Lab Results  Component Value Date   WBC 11.4 (H) 11/20/2017   HGB 10.2 (L) 11/20/2017   HGB 11.1 (L) 03/09/2014   HCT 32.2 (L) 11/20/2017   HCT 35.4 03/09/2014   PLT 218 11/20/2017   PLT 217 03/09/2014   LYMPHOPCT 11 11/15/2017   LYMPHOPCT 14.4 03/09/2014   MONOPCT 8 11/15/2017   MONOPCT 5.9 03/09/2014   EOSPCT 2 11/15/2017   EOSPCT 2.3 03/09/2014   BASOPCT 1 11/15/2017   BASOPCT 0.6 03/09/2014   CMP:  Lab Results  Component Value Date   NA 143 11/21/2017   NA 141 03/09/2014   K 5.0 11/21/2017   K 4.2 03/09/2014   CL 106 11/21/2017   CL 101 03/09/2014   CO2 28 11/21/2017   CO2 30 03/09/2014   BUN 46 (H) 11/21/2017   BUN 35 (H) 03/09/2014   CREATININE 2.62 (H) 11/21/2017   CREATININE 1.50 (H) 03/09/2014   PROT 7.1 11/15/2017   PROT 7.3 03/09/2014   ALBUMIN 3.4 (L) 11/21/2017   ALBUMIN 3.5 03/09/2014   BILITOT 0.8 11/15/2017   BILITOT 0.3 03/09/2014   ALKPHOS 115 11/15/2017   ALKPHOS 113 03/09/2014   AST 37 11/15/2017   AST 16 03/09/2014   ALT 21 11/15/2017   ALT 23 03/09/2014  .  Micro Results No results found for this or any previous visit (from the past 240 hour(s)).      Code Status Orders  (From admission, onward)         Start     Ordered   11/15/17 2056  Full code  Continuous     11/15/17 2055        Code Status History    Date Active Date Inactive Code Status Order ID Comments User Context   10/18/2014 2221 10/23/2014 2043 Full Code [id number]  Jacqulynn Cadet, MD Inpatient   10/18/2014 2119 10/18/2014 2221 Full Code [id number]  Guy Begin, MD Inpatient    Advance Directive Documentation      Most Recent Value  Type of Advance Directive  Healthcare Power of Attorney, Living will  Pre-existing out of facility DNR order (yellow form or pink MOST form)  -  "MOST" Form in Place?  -          Follow-up Information    Morayati, Lourdes Sledge, MD. Go on 11/28/2017.   Specialty:  Endocrinology Why:  @ 9:30am  Contact information: [place] [address] [phone]        Anthonette Legato, MD. Go on 11/30/2017.   Specialty:  Internal Medicine Why:  @ 9:40am Contact information: [place] [address] [phone]           Discharge Medications   Allergies as of 11/21/2017   No Known Allergies     Medication List    STOP taking these medications   furosemide 20 MG tablet Commonly known as:  LASIX   lisinopril-hydrochlorothiazide  20-25 MG tablet Commonly known as:  PRINZIDE,ZESTORETIC   metFORMIN 1000 MG tablet Commonly known as:  GLUCOPHAGE     TAKE these medications   allopurinol 300 MG tablet Commonly known as:  ZYLOPRIM Take 300 mg by mouth daily.   ALPRAZolam 0.5 MG tablet Commonly known as:  XANAX Take 0.5 mg by mouth as needed for anxiety.   atorvastatin 20 MG tablet Commonly known as:  LIPITOR Take 20 mg by mouth at bedtime.   calcium carbonate 1500 (600 Ca) MG Tabs tablet Commonly known as:  OSCAL Take 600 mg of elemental calcium by mouth 2 (two) times daily with a meal.   CINNAMON PO Take 1,000 mg by mouth 2 (two) times daily.   cloNIDine 0.2 MG tablet Commonly known as:  CATAPRES Take 0.2 mg by mouth daily.   ELIQUIS 5 MG Tabs tablet Generic drug:  apixaban TAKE 1 TABLET TWICE A DAY What changed:  how much to take   ergocalciferol 50000 units capsule Commonly known as:  VITAMIN D2 Take 50,000 Units by mouth every Friday.   ferrous gluconate 240 (27 FE) MG tablet Commonly known as:  FERGON Take 240 mg by mouth 2 (two) times daily.   folic acid 003 MCG tablet Commonly known as:   FOLVITE Take 800 mcg by mouth daily.   letrozole 2.5 MG tablet Commonly known as:  FEMARA TAKE 1 TABLET DAILY   levothyroxine 112 MCG tablet Commonly known as:  SYNTHROID, LEVOTHROID Take 112 mcg daily before breakfast by mouth.   sertraline 50 MG tablet Commonly known as:  ZOLOFT Take 50 mg by mouth daily.   TRULICITY 1.5 KJ/1.7HX Sopn Generic drug:  Dulaglutide Inject 1.5 mg into the skin every Monday. night   VITAMIN C PO Take 1 tablet by mouth 2 (two) times daily.          Total Time in preparing paper work, data evaluation and todays exam - 57 minutes  Dustin Flock M.D on 11/21/2017 at 11:42 AM [place]  [phone]

## 2017-11-21 NOTE — Progress Notes (Signed)
Nicole Schmidt at [place] was admitted to the [facility] on 11/15/2017 and Discharged  11/21/2017 and should be excused from work/school   for 15 days starting 11/15/2017 , may return to work/school without any restrictions.  Call Dustin Flock MD with questions.  Dustin Flock M.D on 11/21/2017,at 10:05 AM  [place] at [facility]  [phone]

## 2017-11-21 NOTE — Progress Notes (Signed)
[facility]  ROUNDING NOTE   Subjective:   Creatinine improving 2.62 (3.16)  Objective:  Vital signs in last 24 hours:  Temp:  [97.7 F (36.5 C)-98.3 F (36.8 C)] 98.3 F (36.8 C) (10/09 1032) Pulse Rate:  [72-83] 83 (10/09 1032) Resp:  [17-19] 19 (10/09 1032) BP: (157-188)/(61-84) 158/62 (10/09 1032) SpO2:  [96 %-98 %] 96 % (10/09 1032)  Weight change:  Filed Weights   11/18/17 1352 11/19/17 0451 11/20/17 0502  Weight: (!) 155.4 kg (!) 153.7 kg (!) 155.8 kg    Intake/Output: I/O last 3 completed shifts: In: 842.1 [I.V.:842.1] Out: -    Intake/Output this shift:  Total I/O In: 361.4 [P.O.:240; I.V.:121.4] Out: -   Physical Exam: General: No acute distress  Head: Normocephalic, atraumatic. Moist oral mucosal membranes  Eyes: Anicteric  Neck: Supple, trachea midline  Lungs:  Clear to auscultation, normal effort  Heart: S1S2 no rubs  Abdomen:  Soft, nontender, bowel sounds present  Extremities: trace peripheral edema.  Neurologic: Awake, alert, following commands  Skin: No lesions  Access:  Right temp IJ dialysis catheter 10/6 Dr. Lorenso Schmidt    Basic Metabolic Panel: Recent Labs  Lab 11/17/17 0000  11/18/17 0509 11/18/17 1235 11/18/17 2335 11/19/17 0421 11/20/17 0401 11/21/17 0418  NA 128*  --  132*  --   --  138 141 143  K 5.4*  --  4.7  --   --  4.8 4.6 5.0  CL 92*  --  98  --   --  103 105 106  CO2 23  --  24  --   --  28 28 28   GLUCOSE 100*  --  111*  --   --  116* 97 106*  BUN 62*  --  63*  --   --  45* 49* 46*  CREATININE 6.30*  --  5.48*  --   --  3.70* 3.16* 2.62*  CALCIUM 8.4*  --  8.3*  --   --  8.6* 9.1 9.2  PHOS  --    < > 7.6* 6.5* 4.8* 4.6 4.2 4.2   < > = values in this interval not displayed.    Liver Function Tests: Recent Labs  Lab 11/15/17 1500 11/18/17 0509 11/19/17 0421 11/20/17 0401 11/21/17 0418  AST 37  --   --   --   --   ALT 21  --   --   --   --   ALKPHOS 115  --   --   --   --   BILITOT 0.8  --   --   --    --   PROT 7.1  --   --   --   --   ALBUMIN 3.9 3.1* 3.2* 3.5 3.4*   No results for input(s): LIPASE, AMYLASE in the last 168 hours. No results for input(s): AMMONIA in the last 168 hours.  CBC: Recent Labs  Lab 11/15/17 1500 11/16/17 0405 11/17/17 0000 11/18/17 0508 11/19/17 0421 11/20/17 0401  WBC 15.1* 13.2* 11.2* 10.7 9.7 11.4*  NEUTROABS 11.9*  --   --   --   --   --   HGB 10.3* 9.8* 10.5* 10.2* 10.2* 10.2*  HCT 32.3* 28.2* 33.0* 31.8* 32.2* 32.2*  MCV 92.2 91.9 92.4 91.5 92.7 93.1  PLT 232 186 198 202 215 218    Cardiac Enzymes: No results for input(s): CKTOTAL, CKMB, CKMBINDEX, TROPONINI in the last 168 hours.  BNP: Invalid input(s): POCBNP  CBG: Recent Labs  Lab 11/20/17 0730 11/20/17 1215 11/20/17 1704 11/20/17 2102 11/21/17 0740  GLUCAP 89 153* 88 115* 53    Microbiology: Results for orders placed or performed during the hospital encounter of 10/18/14  MRSA PCR Screening     Status: None   Collection Time: 10/18/14  8:15 PM  Result Value Ref Range Status   MRSA by PCR NEGATIVE NEGATIVE Final    Comment:        The GeneXpert MRSA Assay (FDA approved for NASAL specimens only), is one component of a comprehensive MRSA colonization surveillance program. It is not intended to diagnose MRSA infection nor to guide or monitor treatment for MRSA infections.     Coagulation Studies: No results for input(s): LABPROT, INR in the last 72 hours.  Urinalysis: No results for input(s): COLORURINE, LABSPEC, PHURINE, GLUCOSEU, HGBUR, BILIRUBINUR, KETONESUR, PROTEINUR, UROBILINOGEN, NITRITE, LEUKOCYTESUR in the last 72 hours.  Invalid input(s): APPERANCEUR    Imaging: No results found.   Medications:    . apixaban  5 mg Oral BID  . atorvastatin  20 mg Oral Daily  . Chlorhexidine Gluconate Cloth  6 each Topical Q0600  . ferrous sulfate  325 mg Oral BID WC  . folic acid  3,403 mcg Oral Daily  . insulin aspart  0-15 Units Subcutaneous TID WC  .  insulin aspart  0-5 Units Subcutaneous QHS  . letrozole  2.5 mg Oral Daily  . levothyroxine  112 mcg Oral QAC breakfast  . sertraline  50 mg Oral Daily  . vitamin C  500 mg Oral BID   acetaminophen **OR** acetaminophen, ALPRAZolam, hydrALAZINE, HYDROcodone-acetaminophen, ondansetron **OR** ondansetron (ZOFRAN) IV, polyethylene glycol, promethazine  Assessment/ Plan:  [name] is a 51 y.o. white female with history of breast cancer, diabetes mellitus type 2, gout, hypertension, pulmonary embolism, prior episode of acute renal failure who was admitted now for acute renal failure.  1.  Acute renal failure with hyperkalemia and hyponatremia: on chronic kidney disease stage III with hematuria and glucosuria. Baseline Creatinine of 1.39, GFR of 42 (similar labs reported on 9/25 at Dr. Gerarda Schmidt office).  Required emergent hemodialysis treatment on admission 10/6 Unclear cause for acute renal failure. Was previously on hydrochlorothiazide and furosemide. Negative renal ultrasound Chronic Kidney Disease secondary to diabetic nephropathy - Encourage PO intake.  - Holding hydrochlorothiazide and furosemide.   2. Hypertension: blood pressures are elevated. With peripheral edema. Home regimen of hydrochlorothiazide, furosemide, clonidine - Pending echocardiogram.   3. Anemia with kidney failure: normocytic. Hemoglobin 10.2 - ferrous sulfate.   4. Diabetes mellitus type II with chronic kidney disease: hemoglobin A1c of 6.1% on 11/07/17.  - hold metformin.    LOS: 6 Nicole Schmidt 10/9/201910:53 AM

## 2017-11-21 NOTE — Progress Notes (Signed)
Discharge instructions reviewed with patient, questions answered. Peripheral IVs removed. Pt declined assistance getting dressed or getting belongings together. Room checked for any remaining belongings. Pt taken to visitor entrance via wheelchair without incidence.

## 2017-11-21 NOTE — Plan of Care (Signed)

## 2017-11-21 NOTE — Discharge Instructions (Signed)

## 2017-11-21 NOTE — Progress Notes (Signed)
ANTICOAGULATION CONSULT NOTE - Initial Consult  Pharmacy Consult for heparin Indication: apixaban PTA but admitted with AKI so switching AC to UFH (hx PE 10/2014 but no acute indication noted)  No Known Allergies  Patient Measurements: Height: 5\' 3"  (160 cm) Weight: (!) 343 lb 6.4 oz (155.8 kg) IBW/kg (Calculated) : 52.4 Heparin Dosing Weight: 90.9 kg  Vital Signs: Temp: 97.7 F (36.5 C) (10/09 0528) Temp Source: Oral (10/09 0528) BP: 157/61 (10/09 0528) Pulse Rate: 72 (10/09 0528)  Labs: Recent Labs    11/19/17 0421 11/19/17 1222 11/19/17 2031 11/20/17 0401 11/20/17 1328 11/20/17 1946 11/21/17 0418  HGB 10.2*  --   --  10.2*  --   --   --   HCT 32.2*  --   --  32.2*  --   --   --   PLT 215  --   --  218  --   --   --   APTT 48* 57* >160* 44*  --   --   --   HEPARINUNFRC 0.45  --   --  0.34 0.45 0.38 0.39  CREATININE 3.70*  --   --  3.16*  --   --   --     Estimated Creatinine Clearance: 30.1 mL/min (A) (by C-G formula based on SCr of 3.16 mg/dL (H)).   Medical History: Past Medical History:  Diagnosis Date  . Anemia   . Arthritis   . Breast cancer of upper-inner quadrant of right female breast ([place])    Right breast invasive CA and DCIS , T1,N0,M0. Er/PR pos, her 2 negative  . Diabetes mellitus without complication ([place])   . Gout   . Gout   . Hypertension   . Menopause    age 52  . Personal history of radiation therapy   . Pulmonary embolism ([place]) 10/2014  . Renal insufficiency   . Thyroid goiter     Medications:  Infusions:  . heparin 1,650 Units/hr (11/21/17 0155)    Assessment: 1 yof direct admit from oncology clinic for increased serum creatinine. Patient takes apixaban PTA for history of PE in 2016. Due to AKI we will transition to heparin infusion while patient is worked up for AKI.   Goal of Therapy:  Heparin level 0.3-0.7 units/ml aPTT 68 to 109 seconds Monitor platelets by anticoagulation protocol: Yes   HEPARIN COURSE 10/04 @ 1657  aPPT 64 which is slightly subtherapeutic.  Will bolus with 1350 units and then increase rate to 1600 units/hr Check aPTT in 6 hours, monitor HL daily until HL and aPTT correlate per protocol Continue to monitor H&H and platelets 10/05 0000 aPTT 87. Continue current regimen  Recheck aPTT, heparin level, and CBC with 10/6 AM labs. 10/06 AM aPTT 79. Continue current regimen. Recheck aPTT, heparin level and CBC with tomorrow AM labs. 10/7 AM aPTT 48, heparin level 0.45. Increase rate to 1700 units/hr and recheck in 6 hours. 10/08 @ 0500 aPTT 44 seconds, HL 0.34. Levels correlating, will dose off of HL. Will increase rate slightly to 1650 units/hr and will recheck HL @ 1200. CBC stable. 10/8 13:28 HL therapeutic x 1. Continue current rate and recheck HL in 6 hours.  10/8 1946 2nd HL therapeutic   Plan:  10/09 @ 0500 HL 0.39 therapeutic. Will continue current rate and will recheck HL w/ am labs.  Tobie Lords, PharmD, BCPS Clinical Pharmacist 11/21/2017

## 2017-11-28 ENCOUNTER — Encounter: Payer: Self-pay | Admitting: Hematology and Oncology

## 2017-11-29 ENCOUNTER — Ambulatory Visit
Admission: RE | Admit: 2017-11-29 | Discharge: 2017-11-29 | Disposition: A | Discharge status: Home / Self Care | Payer: BLUE CROSS/BLUE SHIELD | Source: Ambulatory Visit | Attending: Urgent Care | Admitting: Urgent Care

## 2017-11-29 ENCOUNTER — Inpatient Hospital Stay: Admit: 2017-11-29 | Payer: BLUE CROSS/BLUE SHIELD

## 2017-11-29 DIAGNOSIS — Z79811 Long term (current) use of aromatase inhibitors: Secondary | ICD-10-CM | POA: Diagnosis not present

## 2017-12-04 ENCOUNTER — Inpatient Hospital Stay (HOSPITAL_BASED_OUTPATIENT_CLINIC_OR_DEPARTMENT_OTHER): Payer: BLUE CROSS/BLUE SHIELD | Admitting: Urgent Care

## 2017-12-04 VITALS — BP 174/78 | HR 75 | Temp 98.8°F | Resp 18 | Wt 331.4 lb

## 2017-12-04 DIAGNOSIS — R749 Abnormal serum enzyme level, unspecified: Secondary | ICD-10-CM

## 2017-12-04 DIAGNOSIS — E875 Hyperkalemia: Secondary | ICD-10-CM

## 2017-12-04 DIAGNOSIS — Z79810 Long term (current) use of selective estrogen receptor modulators (SERMs): Secondary | ICD-10-CM | POA: Diagnosis not present

## 2017-12-04 DIAGNOSIS — Z9189 Other specified personal risk factors, not elsewhere classified: Secondary | ICD-10-CM

## 2017-12-04 DIAGNOSIS — G8929 Other chronic pain: Secondary | ICD-10-CM | POA: Diagnosis not present

## 2017-12-04 DIAGNOSIS — N183 Chronic kidney disease, stage 3 (moderate): Secondary | ICD-10-CM | POA: Diagnosis not present

## 2017-12-04 DIAGNOSIS — E871 Hypo-osmolality and hyponatremia: Secondary | ICD-10-CM | POA: Diagnosis not present

## 2017-12-04 DIAGNOSIS — Z7901 Long term (current) use of anticoagulants: Secondary | ICD-10-CM | POA: Diagnosis not present

## 2017-12-04 DIAGNOSIS — Z79899 Other long term (current) drug therapy: Secondary | ICD-10-CM | POA: Diagnosis not present

## 2017-12-04 DIAGNOSIS — Z79811 Long term (current) use of aromatase inhibitors: Secondary | ICD-10-CM

## 2017-12-04 DIAGNOSIS — E878 Other disorders of electrolyte and fluid balance, not elsewhere classified: Secondary | ICD-10-CM

## 2017-12-04 DIAGNOSIS — D509 Iron deficiency anemia, unspecified: Secondary | ICD-10-CM | POA: Diagnosis not present

## 2017-12-04 DIAGNOSIS — R748 Abnormal levels of other serum enzymes: Secondary | ICD-10-CM

## 2017-12-04 DIAGNOSIS — C50911 Malignant neoplasm of unspecified site of right female breast: Secondary | ICD-10-CM

## 2017-12-04 DIAGNOSIS — N179 Acute kidney failure, unspecified: Secondary | ICD-10-CM | POA: Diagnosis not present

## 2017-12-04 DIAGNOSIS — E860 Dehydration: Secondary | ICD-10-CM | POA: Diagnosis not present

## 2017-12-04 DIAGNOSIS — I10 Essential (primary) hypertension: Secondary | ICD-10-CM

## 2017-12-04 DIAGNOSIS — I2782 Chronic pulmonary embolism: Secondary | ICD-10-CM | POA: Diagnosis not present

## 2017-12-04 DIAGNOSIS — E1122 Type 2 diabetes mellitus with diabetic chronic kidney disease: Secondary | ICD-10-CM | POA: Diagnosis not present

## 2017-12-04 DIAGNOSIS — C50211 Malignant neoplasm of upper-inner quadrant of right female breast: Secondary | ICD-10-CM

## 2017-12-04 DIAGNOSIS — Z170 Estrogen receptor positive status [ER+]: Secondary | ICD-10-CM | POA: Diagnosis not present

## 2017-12-04 DIAGNOSIS — Z86711 Personal history of pulmonary embolism: Secondary | ICD-10-CM

## 2017-12-04 NOTE — Progress Notes (Signed)
Patient here today for follow up.  Patient recently in hospital for acute renal failure.  She was taken off Lasix, Lisinopril/HCTZ and Metformin.  Patient states she is feeling much better.  Offers no complaints today.

## 2017-12-04 NOTE — Progress Notes (Signed)
[facility] day:  12/04/17     Chief Complaint: Nicole Schmidt is a 52 y.o. female with stage I breast cancer, history of submassive pulmonary embolism, and iron deficiency anemia who is seen for reassessment following recent admission.   HPI: The patient was last seen in the medical oncology clinic on 11/15/2017.  At that time, patient was having difficulties with controlling her blood pressures despite multi-agent therapy. Urine output was decreased.  She had just started back to work FT and was "sweating a lot". She was exertionally dyspneic. No breast concerns; continued on letrozle. Exam grossly unremarkable. WBC 15,100 (ANC 11,900). Sodium was low at 127. Hemoglobin 10.3. BUN 59 and creatinine 4.54 (baseline 1.3). She was admitted to the hospital from clinic.   Patient was admitted to [facility] from 11/15/2017 - 11/21/2017. Notes from hospital course reviewed. She was seen in consult by nephrology Holley Raring, MD) who felt her ARF was secondary to volume depletion, as patient was on both a loop and thiazide diuretic. Also made note of increased perspiration at work as being contributory. Patient underwent gentle IV rehydration. Despite efforts, renal function continued to deteriorate; BUN 62 and creatine 6.3. Potassium was elevated to 5.4 mmol/L. Temporary HD catheter was placed and patient underwent dialysis on 11/28/2017, which improved her overall renal function. Echocardiogram on 11/20/2017 demonstrated an LVEF of 55-60%.  Patient deemed stable for discharge on 11/21/2017. Pre-discharge labs revealed a BUN of 46 and creatinine of 2.62. Potassium stable at 5.0 mmol/L. Several potentially nephrotoxic medications (furosemide, Zestoretic, Metformin) were discontinued. She was to follow up with PCP and nephrology in 1 week.   Routine bone density was done on 11/29/2017 that revealed a normal T-score of -1.0 in the LEFT femoral neck.   Repeat labs were obtained  earlier today at PCPs office. WBC 10,500 ([place] 6800). Hemoglobin 10.3, hematocrit 33.1, MCV 92, and platelets 276,000. BUN 37 and creatinine 1.75. Sodium 145 and potassium 5.1. ALP 161. TSH 0.968 and free T4 1.8.  In the interim, patient notes that she is feeling better. She notes that she is voiding normally at this point. She is being followed closely, as an outpatient, by nephrology. Continues to have peripheral edema and exertional shortness of breath, however states, "it is no where as near as bad as it was when I last saw you". She denies episodes of chest pain. Blood pressure elevated at 174/78 in clinic today. Patient has been taken off several of her antihypertensives due to her acute renal insufficiency.   She denies any acute concerns and notes that she is feeling generally well. Patient denies that she has experienced any B symptoms. She denies any interval infections. Patient advises that she maintains an adequate appetite. She is eating well. Weight today is (!) 331 lb 6 oz (150.3 kg), which compared to her last visit to the clinic, represents a 5 pound weight loss.    Patient denies pain in the clinic today, however notes to RIGHT upper chest wall and anterior neck "tenderness" from recent RIJ dialysis catheter.    Past Medical History:  Diagnosis Date  . Anemia   . Arthritis   . Breast cancer of upper-inner quadrant of right female breast ([place])    Right breast invasive CA and DCIS , T1,N0,M0. Er/PR pos, her 2 negative  . Diabetes mellitus without complication ([place])   . Gout   . Gout   . Hypertension   . Menopause    age  44  . Personal history of radiation therapy   . Pulmonary embolism ([place]) 10/2014  . Renal insufficiency   . Thyroid goiter     Past Surgical History:  Procedure Laterality Date  . BREAST BIOPSY Right 2014   breast ca  . BREAST EXCISIONAL BIOPSY Right 07/19/2012   breast ca  . BREAST SURGERY Right 2014   with sentinel node bx subareaolar duct excision   . COLONOSCOPY WITH PROPOFOL N/A 01/30/2017   Procedure: COLONOSCOPY WITH PROPOFOL;  Surgeon: Christene Lye, MD;  Location: [facility] ENDOSCOPY;  Service: Endoscopy;  Laterality: N/A;    Family History  Problem Relation Age of Onset  . Diabetes Father   . Hypertension Father   . Parkinson's disease Father   . Hypertension Mother   . Rheum arthritis Mother   . Atrial fibrillation Mother   . Heart failure Mother   . Colon cancer Cousin 46  . Breast cancer Neg Hx     Social History:  reports that she has never smoked. She has never used smokeless tobacco. She reports that she does not drink alcohol or use drugs.  Her mother died of a MI.  Her father died in May 21, 2015.  He had Parkinson's disease and dementia.  She is an only child.  She has never married.  The patient is alone today.  Allergies: No Known Allergies  Current Medications: Current Outpatient Medications  Medication Sig Dispense Refill  . allopurinol (ZYLOPRIM) 300 MG tablet Take 300 mg by mouth daily.     Marland Kitchen ALPRAZolam (XANAX) 0.5 MG tablet Take 0.5 mg by mouth as needed for anxiety.     . Ascorbic Acid (VITAMIN C PO) Take 1 tablet by mouth 2 (two) times daily.     Marland Kitchen atorvastatin (LIPITOR) 20 MG tablet Take 20 mg by mouth at bedtime.     . calcium carbonate (OSCAL) 1500 (600 Ca) MG TABS tablet Take 600 mg of elemental calcium by mouth 2 (two) times daily with a meal.    . Cholecalciferol (VITAMIN D3) 50000 units TABS Take 1 tablet by mouth once a week.    Marland Kitchen CINNAMON PO Take 1,000 mg by mouth 2 (two) times daily.     . cloNIDine (CATAPRES) 0.2 MG tablet Take 0.2 mg by mouth daily.    . Dulaglutide (TRULICITY) 1.5 TU/8.8KC SOPN Inject 1.5 mg into the skin every Monday. night    . ELIQUIS 5 MG TABS tablet TAKE 1 TABLET TWICE A DAY (Patient taking differently: Take 5 mg by mouth 2 (two) times daily. ) 180 tablet 1  . ergocalciferol (VITAMIN D2) 50000 UNITS capsule Take 50,000 Units by mouth every Friday.     . ferrous  gluconate (FERGON) 240 (27 FE) MG tablet Take 240 mg by mouth 2 (two) times daily.    . folic acid (FOLVITE) 003 MCG tablet Take 800 mcg by mouth daily.     Marland Kitchen letrozole (FEMARA) 2.5 MG tablet TAKE 1 TABLET DAILY (Patient taking differently: Take 2.5 mg by mouth daily. ) 90 tablet 1  . levothyroxine (SYNTHROID, LEVOTHROID) 112 MCG tablet Take 112 mcg daily before breakfast by mouth.    . sertraline (ZOLOFT) 50 MG tablet Take 50 mg by mouth daily.     No current facility-administered medications for this visit.     Review of Systems  Constitutional: Positive for diaphoresis (intermittent) and weight loss (down 5 pounds). Negative for fever and malaise/fatigue.  HENT: Negative.   Eyes: Negative.   Respiratory: Positive for  shortness of breath (exertional). Negative for cough, hemoptysis and sputum production.   Cardiovascular: Positive for leg swelling. Negative for chest pain, palpitations, orthopnea and PND.       HTN requiring multi-drug management. Recent echocardiogram showed LVEF of 55-60%.  Gastrointestinal: Negative for abdominal pain, blood in stool, constipation, diarrhea, melena, nausea and vomiting.  Genitourinary: Negative for dysuria, frequency, hematuria and urgency.       Voiding normally at this point.   Musculoskeletal: Positive for joint pain. Negative for back pain, falls and myalgias.  Skin: Negative for itching and rash.       Slight brusiing to RIGHT neck; IJ catheter previously inserted.   Neurological: Negative for dizziness, tremors, weakness and headaches.  Endo/Heme/Allergies: Does not bruise/bleed easily.       PMH (+) for diabetes  Psychiatric/Behavioral: Negative for depression, memory loss and suicidal ideas. The patient is not nervous/anxious and does not have insomnia.   All other systems reviewed and are negative.  Performance status (ECOG): 1 - Symptomatic but completely ambulatory  Vital Signs: BP (!) 174/78 (BP Location: Left Arm, Patient Position:  Sitting)   Pulse 75   Temp 98.8 F (37.1 C) (Tympanic)   Resp 18   Wt (!) 331 lb 6 oz (150.3 kg)   LMP 07/17/2012   BMI 58.70 kg/m   Physical Exam  Constitutional: She is oriented to person, place, and time and well-developed, well-nourished, and in no distress.  HENT:  Head: Normocephalic and atraumatic.  Mouth/Throat: Oropharynx is clear and moist and mucous membranes are normal.  Brown hair  Eyes: Pupils are equal, round, and reactive to light. EOM are normal. No scleral icterus.  Blue eyes  Neck: Normal range of motion. Neck supple. Muscular tenderness (at site of RIJ HD catheter placement; no signs of infection) present. No tracheal deviation present. No thyromegaly present.  Cardiovascular: Normal rate, regular rhythm, normal heart sounds and intact distal pulses. Exam reveals no gallop and no friction rub.  No murmur heard. Pulmonary/Chest: Effort normal and breath sounds normal. No respiratory distress. She has no wheezes. She has no rales.  Abdominal: Soft. Bowel sounds are normal. She exhibits no distension. There is no tenderness.  Musculoskeletal: Normal range of motion. She exhibits edema (1+ to BLE). She exhibits no tenderness.  Neurological: She is alert and oriented to person, place, and time.  Skin: Skin is warm and dry. Bruising (Right upper chest wall and neck 2/2 RIJ dialysis catheter insertion/removal) noted. No rash noted. No erythema.  Psychiatric: Mood, affect and judgment normal.  Nursing note and vitals reviewed.   No visits with results within 3 Day(s) from this visit.  Latest known visit with results is:  No results displayed because visit has over 200 results.      Assessment:  Nicole Schmidt is a 52 y.o. female with a history of stage I right breast cancer status post wide local incision on 08/12/2012.  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  Sentinel lymph node was negative.  Tumor was ER/PR positive and Her2/neu negative.   Oncotype DX score was 14 (low risk).  She completed breast radiation in 10/2012.  She began tamoxifen in 10/2012, but discontinued it on 10/19/2014 following a submassive PE.    Bilateral diagnostic mammogram on 10/10/2016 revealed no mammographic evidence for malignancy.  CA27.29 has been followed: 21.7 on 04/24/2013, 21.4 on 07/31/2013, 17.9 on 03/09/2014, 20.7 on 01/18/2015, 17.2 on 06/10/2015, 22.5 on 08/13/2015, 16.6 on 11/08/2015, 19.8 on  01/31/2016, 25.2 on 05/29/2016, 19.3 on 11/30/2016, and 22.1 on 05/14/2017.  She had a bilateral submassive pulmonary emboli on 10/18/2014.  Chest CT angiogram revealed submassive PE with right heart strain.  She received catheter directed lytic therapy at [facility].  Lower extremity duplex was negative for DVT.  She was on Xarelto (discontinued secondary to oral bleeding).  She began Eliquis on 07/08/2015.  Hypercoagulable work-up revealed the following normal studies:  Factor V Leiden, prothrombin gene mutation, lupus anticoagulant panel, anticardiolipin antibodies, protein S total (127%), protein S activity (99%), protein C total (71%), protein C activity (77%), and ATIII activity (82%).  Bone density study on 01/26/2015 was normal with a T score of -0.3.  She has had no menses since 07/2012.  Labs on 01/18/2015 confirmed a post-menopausal status.  She began Femara on 06/10/2015.   BCI testing indicate a 3% (95% CI: 0.8-5.0%) risk of late recurrence, thus a low likelihood of benefit for extended (5 versus 10 years) adjuvant hormonal therapy.   She was noted to have a normocytic anemia on 06/10/2015.  Labs on 07/08/2015 revealed documented iron deficiency.  Ferritin was 25, iron saturation 6%, TIBC 473 (high).  Normal labs included B12, folate, SPEP revealed, LFTs, and guaiac cards x 3.   She is on oral iron with vitamin C.  Ferritin has been followed:  25 on 07/08/2015, 30 on 11/08/2015, 46 on 01/31/2016, 37 on 05/29/2016,  53 on 05/14/2017, and 92 on  11/14/2017.   She was noted to have stage III renal insufficiency on 06/10/2015.  Creatinine was 1.34 (CrCl 45 ml/min) with prior baseline Cr of 0.86 - 0.93. She was seen by nephrology.  Etiology was felt secondary to dehydration.  Echocardiogram on 11/20/2017 demonstrated an LVEF of 55-60%.  Bone density on 11/29/2017 that revealed a normal T-score of -1.0 in the LEFT femoral neck.   She was admitted to [facility] from 11/15/2017 - 11/21/2017 for ARF (creatinine 4.54) secondary to volume depletion, as patient was on both a loop and thiazide diuretic. Patient underwent gentle IV rehydration. Despite efforts, renal function continued to deteriorate; BUN 62 and creatine 6.3. Potassium was elevated to 5.4 mmol/L. She underwent dialysis on 11/28/2017.  Pre-discharge labs revealed a BUN of 46 and creatinine of 2.62. Potassium down to 5.0 mmol/L.  Symptomatically, patient is doing well overall.  She denies any acute complaints.  Patient is voiding normally at this point.  She is being followed closely by outpatient nephrology.  She presents today with an elevated blood pressure.  Of note, patient had several of her antihypertensives discontinued in the hospital due to ARF.  Denies chest pain.  Continues to have peripheral edema and exertional shortness of breath.  Eating well; weight down 5 pounds.  Exam is grossly unremarkable.  Labs from PCP office reviewed.    Plan: 1. Obtain copy of labs from PCP and review. 2. Review interval admission from 11/15/2017 - 11/21/2017.  3. Review bone density study from 11/29/2017  T-score normal at -1.0.  Ten-year fracture probability by FRAX of 3% or greater for hip fracture or 20% or greater for major osteoporotic fracture. 4. Stage I RIGHT breast cancer  Doing well with regards to her breast cancer. No breast complaints.   Continues on endocrine therapy, however she is ready to discontinue.  Review BCI testing results that indicate a 3% (95% CI: 0.8-5.0%) risk of  late recurrence between years 5 and 10.  Low likelihood of benefit for extended (5 versus 10 years) adjuvant hormonal  therapy.   She was on tamoxifen for 24 months before discontinuing due to a submassive PE.  She was OFF therapy for a total of 7 months before restarting using an AI.   She has been on letrozole for a total of 30 months at this point.   Discussed need for a full 5 years of endocrine therapy. Anticipate completion in 05/2018.  Routine surveillance schedule reviewed with patient. At this point, patient will be seen in the medical oncology clinic every 6 months for years 4-5, and then annually thereafter.  5. Acute renal failure  Discuss recent admission.   Off potentially nephrotoxic medications (furosemide, Zestoretic, Metformin).   Labs from PCP reviewed.   Renal function improved - BUN 37 and creatinine 1.75 mg/dL.  Followed by nephrology as an outpatient. Continue follow up as already scheduled.  6. Elevated alkaline phosphatase level  ALP 161 U/L (previously 115 U/L on 11/15/2017).  All other LFTs normal.    Last ALP isoenzymes in 06/2017 demonstrated 32% bone fraction, 33% liver fraction, and an elevated intestinal fraction of 35%.  Of note, transient elevations can be seen in patient with type B and O blood after a fatty meal.   Patient's blood type is O (-).   Etiology unknown. Elevations have been transient.   Hepatitis serologies negative earlier this month.   Will continue to monitor routine labs.  7. Hypertension  Blood pressure elevated (SBP 170s) in clinic today.   Off several antihypertensive medications due to ARF.  Review echocardiogram from 11/20/2017. LVEF 55-60%.    Encouraged her to work with PCP and nephrology to manage her blood pressure. 8. Electrolyte derangements  Renal function has improved.  Sodium slightly elevated at 145 mmol/L.  Potassium borderline high at 5.1 mmol/L.  Continue routine lab monitoring with PCP as  previously scheduled 9. Iron deficiency anemia (IDA)  Hemoglobin 10.3, hematocrit 33.1, MCV 92  Ferritin stable at 92 when last checked on 11/15/2017.  Continue daily oral iron with a source of vitamin C.  10. History of BILATERAL pulmonary emboli  Managed on chronic anticoagulation using apixaban.   Denies bruising or bleeding.   Requesting to discontinue medication at this point.  Reviewed previous VTE while on endocrine therapy.   Will need to remain on anticoagulation while she is on treatment for her breast cancer.   Will discuss potential discontinuation with letrozole in 05/2017.  11. RTC in 6 months for MD assessment and labs (CBC with diff, CMP, ferritin, CA27.29).   Honor Loh, NP  12/04/2017, 8:30 AM

## 2017-12-09 DIAGNOSIS — I10 Essential (primary) hypertension: Secondary | ICD-10-CM | POA: Insufficient documentation

## 2017-12-10 ENCOUNTER — Telehealth: Payer: Self-pay | Admitting: *Deleted

## 2017-12-10 NOTE — Telephone Encounter (Signed)
-----   Message from Karen Kitchens, NP sent at 12/09/2017  1:23 PM EDT ----- She wanted to come off of her letrozole and apixaban. I told her that I would have to research it more before I could say stop. Here is what I found:  1. She was on tamoxifen for 24 months before discontinuing due to a submassive PE.  2. She was OFF therapy for a total of 7 months before restarting using an AI.   3. She has been on letrozole for a total of 30 months at this point.  Discussed need for a full 5 years of endocrine therapy. Anticipate completion in 05/2018.   4. Due to history of VTE with hormonal therapy, she will need to remain on anticoagulation while she is on treatment for her breast cancer.  Nicole Schmidt

## 2017-12-10 NOTE — Telephone Encounter (Signed)
Called patient and LVM that she should continue on her medication until 05-2018.  That will be her 5 year mark. She will also need to continue anticoagulation while on her breast cancer medication.  Patient informed to call if she has further questions.

## 2017-12-13 ENCOUNTER — Other Ambulatory Visit: Payer: BLUE CROSS/BLUE SHIELD

## 2017-12-24 ENCOUNTER — Other Ambulatory Visit: Payer: Self-pay | Admitting: Hematology and Oncology

## 2017-12-24 NOTE — Telephone Encounter (Signed)
  Nicole Schmidt,  Please call patient regarding her current kidney function.  We need to know that this dose of Eliquis is appropriate given her history of renal failure.  M

## 2017-12-25 ENCOUNTER — Encounter: Payer: Self-pay | Admitting: General Surgery

## 2017-12-25 ENCOUNTER — Other Ambulatory Visit: Payer: Self-pay

## 2017-12-25 ENCOUNTER — Ambulatory Visit (INDEPENDENT_AMBULATORY_CARE_PROVIDER_SITE_OTHER): Payer: BLUE CROSS/BLUE SHIELD | Admitting: General Surgery

## 2017-12-25 VITALS — BP 152/73 | HR 73 | Temp 97.9°F | Resp 16 | Ht 63.0 in | Wt 338.0 lb

## 2017-12-25 DIAGNOSIS — Z170 Estrogen receptor positive status [ER+]: Secondary | ICD-10-CM | POA: Diagnosis not present

## 2017-12-25 DIAGNOSIS — C50111 Malignant neoplasm of central portion of right female breast: Secondary | ICD-10-CM | POA: Diagnosis not present

## 2017-12-25 NOTE — Patient Instructions (Signed)
The patient has been asked to return to the office in one year with a bilateral diagnostic mammogram.The patient is aware to call back for any questions or concerns. 

## 2017-12-25 NOTE — Progress Notes (Signed)
Patient ID: Nicole Schmidt, female   DOB: October 21, 1965, 52 y.o.   MRN: [id number]  Chief Complaint  Patient presents with  . Follow-up    HPI Nicole Schmidt is a 52 y.o. female who presents for a breast evaluation. The most recent mammogram was done on 10/22/2017 .  Patient does perform regular self breast checks and gets regular mammograms done.   Since the patient's last evaluation she was hospitalized with acute renal failure with a creatinine of 6 requiring dialysis x1.  HPI  Past Medical History:  Diagnosis Date  . Anemia   . Arthritis   . Breast cancer of upper-inner quadrant of right female breast ([place])    Right breast invasive CA and DCIS , T1,N0,M0. Er/PR pos, her 2 negative  . Diabetes mellitus without complication ([place])   . Gout   . Gout   . Hypertension   . Menopause    age 27  . Personal history of radiation therapy   . Pulmonary embolism ([place]) 10/2014  . Renal insufficiency   . Thyroid goiter     Past Surgical History:  Procedure Laterality Date  . BREAST BIOPSY Right 2014   breast ca  . BREAST EXCISIONAL BIOPSY Right 07/19/2012   breast ca  . BREAST SURGERY Right 2014   with sentinel node bx subareaolar duct excision  . COLONOSCOPY WITH PROPOFOL N/A 01/30/2017   Procedure: COLONOSCOPY WITH PROPOFOL;  Surgeon: Christene Lye, MD;  Location: [facility] ENDOSCOPY;  Service: Endoscopy;  Laterality: N/A;    Family History  Problem Relation Age of Onset  . Diabetes Father   . Hypertension Father   . Parkinson's disease Father   . Hypertension Mother   . Rheum arthritis Mother   . Atrial fibrillation Mother   . Heart failure Mother   . Colon cancer Cousin 15  . Breast cancer Neg Hx     Social History Social History   Tobacco Use  . Smoking status: Never Smoker  . Smokeless tobacco: Never Used  Substance Use Topics  . Alcohol use: No    Alcohol/week: 0.0 standard drinks  . Drug use: No    No Known Allergies  Current Outpatient Medications   Medication Sig Dispense Refill  . ALPRAZolam (XANAX) 0.5 MG tablet Take 0.5 mg by mouth as needed for anxiety.     . Ascorbic Acid (VITAMIN C PO) Take 1 tablet by mouth 2 (two) times daily.     Marland Kitchen atorvastatin (LIPITOR) 20 MG tablet Take 20 mg by mouth at bedtime.     . calcium carbonate (OSCAL) 1500 (600 Ca) MG TABS tablet Take 600 mg of elemental calcium by mouth 2 (two) times daily with a meal.    . Cholecalciferol (VITAMIN D3) 50000 units TABS Take 1 tablet by mouth once a week.    Marland Kitchen CINNAMON PO Take 1,000 mg by mouth 2 (two) times daily.     . cloNIDine (CATAPRES) 0.2 MG tablet Take 0.2 mg by mouth daily.    . Dulaglutide (TRULICITY) 1.5 TI/4.5YK SOPN Inject 1.5 mg into the skin every Monday. night    . ELIQUIS 5 MG TABS tablet TAKE 1 TABLET TWICE A DAY 180 tablet 1  . ergocalciferol (VITAMIN D2) 50000 UNITS capsule Take 50,000 Units by mouth every Friday.     . ferrous gluconate (FERGON) 240 (27 FE) MG tablet Take 240 mg by mouth 2 (two) times daily.    . folic acid (FOLVITE) 998 MCG tablet Take 800 mcg by  mouth daily.     Marland Kitchen letrozole (FEMARA) 2.5 MG tablet TAKE 1 TABLET DAILY 90 tablet 1  . levothyroxine (SYNTHROID, LEVOTHROID) 112 MCG tablet Take 112 mcg daily before breakfast by mouth.    . sertraline (ZOLOFT) 50 MG tablet Take 50 mg by mouth daily.     No current facility-administered medications for this visit.     Review of Systems Review of Systems  Constitutional: Negative.   Respiratory: Negative.   Cardiovascular: Negative.     Blood pressure (!) 152/73, pulse 73, temperature 97.9 F (36.6 C), temperature source Skin, resp. rate 16, height 5\' 3"  (1.6 m), weight (!) 338 lb (153.3 kg), last menstrual period 07/17/2012.  Physical Exam Physical Exam  Constitutional: She is oriented to person, place, and time. She appears well-developed and well-nourished.  Eyes: Conjunctivae are normal. No scleral icterus.  Neck: Neck supple.  Cardiovascular: Normal rate, regular  rhythm and normal heart sounds.  Pulmonary/Chest: Effort normal and breath sounds normal. Right breast exhibits no inverted nipple, no mass, no nipple discharge, no skin change and no tenderness. Left breast exhibits no inverted nipple, no mass, no nipple discharge, no skin change and no tenderness.    Lymphadenopathy:    She has no cervical adenopathy.    She has no axillary adenopathy.       Right: No supraclavicular adenopathy present.       Left: No supraclavicular adenopathy present.  Neurological: She is alert and oriented to person, place, and time.  Skin: Skin is warm and dry.    Data Reviewed October 22, 2017 bilateral diagnostic mammograms were reviewed.  Postsurgical change.  BI-RADS-2.  Recommendation for screening mammograms in the future.  BCI testing dated November 16, 2017 showed low likelihood of benefit from extended antiestrogen therapy.  Assessment    Doing well now just over 5 years out from management of an invasive mammary carcinoma in the retroareolar area of the right breast.  Still with 6 months of antiestrogen therapy based to the cessation during the time of her pulmonary embolus.    Plan  The patient had 5 today standard close margins although negative.  Because of this I have asked her to  return to the office in one year with a bilateral diagnostic mammogram.The patient is aware to call back for any questions or concerns.   HPI, Physical Exam, Assessment and Plan have been scribed under the direction and in the presence of Hervey Ard, MD.  Gaspar Cola, CMA  I have completed the exam and reviewed the above documentation for accuracy and completeness.  I agree with the above.  Haematologist has been used and any errors in dictation or transcription are unintentional.  Hervey Ard, M.D., F.A.C.S.  Forest Gleason  12/25/2017, 7:47 PM

## 2018-01-04 NOTE — Telephone Encounter (Signed)
  How is her kidney function?  M

## 2018-01-07 ENCOUNTER — Telehealth: Payer: Self-pay

## 2018-01-07 NOTE — Telephone Encounter (Signed)
Left a message on the patient voice mail to inform her that we need a BMP done and if possible she can get them done at her Heurophology or do we need to do the BMP here at the cancer center.

## 2018-01-08 ENCOUNTER — Inpatient Hospital Stay: Payer: BLUE CROSS/BLUE SHIELD | Attending: Hematology and Oncology

## 2018-01-08 ENCOUNTER — Other Ambulatory Visit: Payer: Self-pay

## 2018-01-08 DIAGNOSIS — I2782 Chronic pulmonary embolism: Secondary | ICD-10-CM

## 2018-01-08 DIAGNOSIS — C50211 Malignant neoplasm of upper-inner quadrant of right female breast: Secondary | ICD-10-CM | POA: Diagnosis present

## 2018-01-08 LAB — BASIC METABOLIC PANEL
Anion gap: 6 (ref 5–15)
BUN: 29 mg/dL — ABNORMAL HIGH (ref 6–20)
CO2: 26 mmol/L (ref 22–32)
Calcium: 9 mg/dL (ref 8.9–10.3)
Chloride: 108 mmol/L (ref 98–111)
Creatinine, Ser: 1.61 mg/dL — ABNORMAL HIGH (ref 0.44–1.00)
GFR calc Af Amer: 42 mL/min — ABNORMAL LOW (ref >60)
GFR calc non Af Amer: 36 mL/min — ABNORMAL LOW (ref >60)
Glucose, Bld: 112 mg/dL — ABNORMAL HIGH (ref 70–99)
Potassium: 3.8 mmol/L (ref 3.5–5.1)
Sodium: 140 mmol/L (ref 135–145)

## 2018-01-09 ENCOUNTER — Other Ambulatory Visit: Payer: BLUE CROSS/BLUE SHIELD

## 2018-01-12 ENCOUNTER — Other Ambulatory Visit: Payer: Self-pay

## 2018-01-12 ENCOUNTER — Emergency Department
Admission: EM | Admit: 2018-01-12 | Discharge: 2018-01-12 | Disposition: A | Discharge status: Home / Self Care | Payer: BLUE CROSS/BLUE SHIELD | Attending: Emergency Medicine | Admitting: Emergency Medicine

## 2018-01-12 ENCOUNTER — Emergency Department: Payer: BLUE CROSS/BLUE SHIELD

## 2018-01-12 DIAGNOSIS — Z7901 Long term (current) use of anticoagulants: Secondary | ICD-10-CM | POA: Diagnosis not present

## 2018-01-12 DIAGNOSIS — E1122 Type 2 diabetes mellitus with diabetic chronic kidney disease: Secondary | ICD-10-CM | POA: Diagnosis not present

## 2018-01-12 DIAGNOSIS — Z79899 Other long term (current) drug therapy: Secondary | ICD-10-CM | POA: Diagnosis not present

## 2018-01-12 DIAGNOSIS — R609 Edema, unspecified: Secondary | ICD-10-CM

## 2018-01-12 DIAGNOSIS — I129 Hypertensive chronic kidney disease with stage 1 through stage 4 chronic kidney disease, or unspecified chronic kidney disease: Secondary | ICD-10-CM | POA: Diagnosis not present

## 2018-01-12 DIAGNOSIS — Z7984 Long term (current) use of oral hypoglycemic drugs: Secondary | ICD-10-CM | POA: Diagnosis not present

## 2018-01-12 DIAGNOSIS — N183 Chronic kidney disease, stage 3 (moderate): Secondary | ICD-10-CM | POA: Diagnosis not present

## 2018-01-12 DIAGNOSIS — R2243 Localized swelling, mass and lump, lower limb, bilateral: Secondary | ICD-10-CM | POA: Diagnosis present

## 2018-01-12 DIAGNOSIS — R600 Localized edema: Secondary | ICD-10-CM | POA: Diagnosis not present

## 2018-01-12 LAB — TROPONIN I
Troponin I: 0.04 ng/mL (ref <0.03)
Troponin I: 0.04 ng/mL (ref <0.03)

## 2018-01-12 LAB — BRAIN NATRIURETIC PEPTIDE: B Natriuretic Peptide: 185 pg/mL — ABNORMAL HIGH (ref 0.0–100.0)

## 2018-01-12 LAB — CBC
HCT: 33.7 % — ABNORMAL LOW (ref 36.0–46.0)
Hemoglobin: 9.9 g/dL — ABNORMAL LOW (ref 12.0–15.0)
MCH: 27.9 pg (ref 26.0–34.0)
MCHC: 29.4 g/dL — ABNORMAL LOW (ref 30.0–36.0)
MCV: 94.9 fL (ref 80.0–100.0)
Platelets: 236 10*3/uL (ref 150–400)
RBC: 3.55 MIL/uL — ABNORMAL LOW (ref 3.87–5.11)
RDW: 14.2 % (ref 11.5–15.5)
WBC: 10.1 10*3/uL (ref 4.0–10.5)
nRBC: 0 % (ref 0.0–0.2)

## 2018-01-12 LAB — BASIC METABOLIC PANEL
Anion gap: 8 (ref 5–15)
BUN: 29 mg/dL — ABNORMAL HIGH (ref 6–20)
CO2: 30 mmol/L (ref 22–32)
Calcium: 8.9 mg/dL (ref 8.9–10.3)
Chloride: 107 mmol/L (ref 98–111)
Creatinine, Ser: 1.31 mg/dL — ABNORMAL HIGH (ref 0.44–1.00)
GFR calc Af Amer: 53 mL/min — ABNORMAL LOW (ref >60)
GFR calc non Af Amer: 46 mL/min — ABNORMAL LOW (ref >60)
Glucose, Bld: 111 mg/dL — ABNORMAL HIGH (ref 70–99)
Potassium: 4.3 mmol/L (ref 3.5–5.1)
Sodium: 145 mmol/L (ref 135–145)

## 2018-01-12 LAB — PROTIME-INR
INR: 1.35
Prothrombin Time: 16.5 seconds — ABNORMAL HIGH (ref 11.4–15.2)

## 2018-01-12 MED ORDER — FUROSEMIDE 10 MG/ML IJ SOLN
60.0000 mg | Freq: Once | INTRAMUSCULAR | Status: AC
  Administered 2018-01-12: 60 mg via INTRAVENOUS
  Filled 2018-01-12: qty 8

## 2018-01-12 NOTE — ED Triage Notes (Signed)
Pt comes via POV from home with c/o arm and leg swelling. Pt states she believe she is retaining fluid. Pt states severe ankle and arm swelling limiting her to wear her rings.  Pt states recent hospitalization for kidney failure and was taken off medications due to this. Pt states several of her fluid pills were d/c due to her kidney issues. Pt states SHOB and difficulty walking.

## 2018-01-12 NOTE — ED Notes (Signed)
ED Provider at bedside. 

## 2018-01-12 NOTE — Discharge Instructions (Addendum)
Please seek medical attention for any high fevers, chest pain, shortness of breath, change in behavior, persistent vomiting, bloody stool or any other new or concerning symptoms.  

## 2018-01-12 NOTE — ED Provider Notes (Signed)
[facility] Emergency Department Provider Note   ____________________________________________   I have reviewed the triage vital signs and the nursing notes.   HISTORY  Chief Complaint Leg Swelling (retaining fluid)   History limited by: Not Limited   HPI Nicole Schmidt is a 52 y.o. female who presents to the emergency department today because of concern for swelling.  Patient states that she is noticed some increased swelling or the past month.  She states that roughly 1 month ago she was admitted to the hospital for acute kidney injury.  At that time she was taken off both of Lasix and hydrochlorothiazide.  She states she has noticed that it is hard for her to get her jewelry on and off.  Additionally she has had some increased shortness of breath.  She denies any associated chest pain and fevers.   Per medical record review patient has a history of HTN, AKI  Past Medical History:  Diagnosis Date  . Anemia   . Arthritis   . Breast cancer of upper-inner quadrant of right female breast ([place])    Right breast invasive CA and DCIS , 7 mm T1,N0,M0. Er/PR pos, her 2 negative.  Margins 1 mm.  . Diabetes mellitus without complication ([place])   . Gout   . Gout   . Hypertension   . Menopause    age 37  . Personal history of radiation therapy   . Pulmonary embolism ([place]) 10/2014  . Renal insufficiency   . Thyroid goiter     Patient Active Problem List   Diagnosis Date Noted  . HTN (hypertension) 12/09/2017  . Long term (current) use of aromatase inhibitors 11/17/2017  . Acute renal insufficiency 11/17/2017  . ARF (acute renal failure) ([place]) 11/15/2017  . Elevated alkaline phosphatase level 05/20/2017  . Goals of care, counseling/discussion 05/14/2017  . Chronic renal disease, stage III ([place]) 07/08/2015  . Iron deficiency anemia 07/08/2015  . Acute renal failure superimposed on stage 3 chronic kidney disease ([place]) 10/22/2014  . History of breast cancer  10/22/2014  . Elevated troponin 10/22/2014  . Pulmonary embolus ([place])   . Sepsis ([place]) 10/18/2014  . Pulmonary emboli ([place]) 10/18/2014  . Pulmonary embolism ([place]) 10/18/2014  . Candidiasis of breast 07/06/2014  . Malignant neoplasm of right female breast ([place]) 07/24/2012    Past Surgical History:  Procedure Laterality Date  . BREAST BIOPSY Right 2014   breast ca  . BREAST EXCISIONAL BIOPSY Right 07/19/2012   breast ca  . BREAST SURGERY Right 2014   with sentinel node bx subareaolar duct excision  . COLONOSCOPY WITH PROPOFOL N/A 01/30/2017   Procedure: COLONOSCOPY WITH PROPOFOL;  Surgeon: Christene Lye, MD;  Location: [facility] ENDOSCOPY;  Service: Endoscopy;  Laterality: N/A;    Prior to Admission medications   Medication Sig Start Date End Date Taking? Authorizing Provider  ALPRAZolam Duanne Moron) 0.5 MG tablet Take 0.5 mg by mouth as needed for anxiety.     [provider]  Ascorbic Acid (VITAMIN C PO) Take 1 tablet by mouth 2 (two) times daily.     [provider]  atorvastatin (LIPITOR) 20 MG tablet Take 20 mg by mouth at bedtime.     [provider]  calcium carbonate (OSCAL) 1500 (600 Ca) MG TABS tablet Take 600 mg of elemental calcium by mouth 2 (two) times daily with a meal.    [provider]  Cholecalciferol (VITAMIN D3) 50000 units TABS Take 1 tablet by mouth once a week.  [provider]  CINNAMON PO Take 1,000 mg by mouth 2 (two) times daily.     [provider]  cloNIDine (CATAPRES) 0.2 MG tablet Take 0.2 mg by mouth daily.    [provider]  Dulaglutide (TRULICITY) 1.5 DJ/4.9FW SOPN Inject 1.5 mg into the skin every Monday. night    [provider]  ELIQUIS 5 MG TABS tablet TAKE 1 TABLET TWICE A DAY 12/24/17   Karen Kitchens, NP  ergocalciferol (VITAMIN D2) 50000 UNITS capsule Take 50,000 Units by mouth every Friday.     [provider]  ferrous gluconate (FERGON) 240 (27 FE) MG tablet  Take 240 mg by mouth 2 (two) times daily.    [provider]  folic acid (FOLVITE) 263 MCG tablet Take 800 mcg by mouth daily.     [provider]  letrozole [facility]) 2.5 MG tablet TAKE 1 TABLET DAILY 12/24/17   Karen Kitchens, NP  levothyroxine (SYNTHROID, LEVOTHROID) 112 MCG tablet Take 112 mcg daily before breakfast by mouth.    [provider]  sertraline (ZOLOFT) 50 MG tablet Take 50 mg by mouth daily.    [provider]    Allergies Patient has no known allergies.  Family History  Problem Relation Age of Onset  . Diabetes Father   . Hypertension Father   . Parkinson's disease Father   . Hypertension Mother   . Rheum arthritis Mother   . Atrial fibrillation Mother   . Heart failure Mother   . Colon cancer Cousin 88  . Breast cancer Neg Hx     Social History Social History   Tobacco Use  . Smoking status: Never Smoker  . Smokeless tobacco: Never Used  Substance Use Topics  . Alcohol use: No    Alcohol/week: 0.0 standard drinks  . Drug use: No    Review of Systems Constitutional: No fever/chills Eyes: No visual changes. ENT: No sore throat. Cardiovascular: Denies chest pain. Respiratory: Positive for shortness of breath. Gastrointestinal: No abdominal pain.  No nausea, no vomiting.  No diarrhea.   Genitourinary: Negative for dysuria. Musculoskeletal: Positive for extremity swelling.  Skin: Negative for rash. Neurological: Negative for headaches, focal weakness or numbness.  ____________________________________________   PHYSICAL EXAM:  VITAL SIGNS: ED Triage Vitals  Enc Vitals Group     BP 01/12/18 1405 (!) 189/71     Pulse Rate 01/12/18 1405 76     Resp 01/12/18 1405 12     Temp 01/12/18 1405 98.5 F (36.9 C)     Temp Source 01/12/18 1405 Oral     SpO2 01/12/18 1405 95 %     Weight 01/12/18 1421 (!) 338 lb (153.3 kg)     Height 01/12/18 1421 5\' 3"  (1.6 m)     Head Circumference --      Peak Flow --      Pain  Score 01/12/18 1421 0   Constitutional: Alert and oriented.  Eyes: Conjunctivae are normal.  ENT      Head: Normocephalic and atraumatic.      Nose: No congestion/rhinnorhea.      Mouth/Throat: Mucous membranes are moist.      Neck: No stridor. Hematological/Lymphatic/Immunilogical: No cervical lymphadenopathy. Cardiovascular: Normal rate, regular rhythm.  No murmurs, rubs, or gallops.  Respiratory: Normal respiratory effort without tachypnea nor retractions. Breath sounds are clear and equal bilaterally. No wheezes/rales/rhonchi. Gastrointestinal: Soft and non tender. No rebound. No guarding.  Genitourinary: Deferred Musculoskeletal: 2+ bilateral lower extremity swelling Neurologic:  Normal speech and language. No gross focal neurologic deficits are appreciated.  Skin:  Skin is warm, dry and intact. No rash noted. Psychiatric: Mood and affect are normal. Speech and behavior are normal. Patient exhibits appropriate insight and judgment.  ____________________________________________    LABS (pertinent positives/negatives)  Trop 0.04->0.04 BNP 185.0 INR 1.35 CBC wbc 10.1, hgb 9.9, plt 236 BMP na 145, k 4.3, glu 111, cr 1.31 ____________________________________________   EKG  I, Nance Pear, attending physician, personally viewed and interpreted this EKG  EKG Time: 1433 Rate: 56 Rhythm: sinus bradycardia Axis: normal Intervals: qtc 422 QRS: narrow, q waves III, V1 ST changes: no st elevation Impression: abnormal ekg  ____________________________________________    RADIOLOGY  CXR Cardiomegaly, vascular congestion. No pneumonia  ____________________________________________   PROCEDURES  Procedures  ____________________________________________   INITIAL IMPRESSION / ASSESSMENT AND PLAN / ED COURSE  Pertinent labs & imaging results that were available during my care of the patient were reviewed by me and considered in my medical decision making (see chart  for details).   Patient presented to the emergency department today with concerns for peripheral edema and some shortness of breath.  Patient has not been taking her fluid pills as instructed secondary to an episode of acute kidney injury last month.  Patient's x-ray without any concerning pneumonia.  Blood work shows a minimally elevated troponin which I think is likely secondary to fluid overload.  Patient was given IV Lasix here in the emergency department with good urinary output.  Did instruct the patient to restart her Lasix at 20 mg a day.  Did instruct patient to follow-up with her primary care doctor for close recheck of kidney values given history of kidney injury.   ____________________________________________   FINAL CLINICAL IMPRESSION(S) / ED DIAGNOSES  Final diagnoses:  Peripheral edema     Note: This dictation was prepared with Dragon dictation. Any transcriptional errors that result from this process are unintentional     Nance Pear, MD 01/12/18 2111

## 2018-02-01 ENCOUNTER — Ambulatory Visit: Payer: BLUE CROSS/BLUE SHIELD | Attending: Family | Admitting: Family

## 2018-02-01 ENCOUNTER — Encounter: Payer: Self-pay | Admitting: Family

## 2018-02-01 VITALS — BP 122/70 | HR 84 | Resp 18 | Ht 64.0 in | Wt 331.1 lb

## 2018-02-01 DIAGNOSIS — Z7901 Long term (current) use of anticoagulants: Secondary | ICD-10-CM | POA: Insufficient documentation

## 2018-02-01 DIAGNOSIS — I10 Essential (primary) hypertension: Secondary | ICD-10-CM

## 2018-02-01 DIAGNOSIS — Z8249 Family history of ischemic heart disease and other diseases of the circulatory system: Secondary | ICD-10-CM | POA: Diagnosis not present

## 2018-02-01 DIAGNOSIS — Z86711 Personal history of pulmonary embolism: Secondary | ICD-10-CM | POA: Diagnosis not present

## 2018-02-01 DIAGNOSIS — I890 Lymphedema, not elsewhere classified: Secondary | ICD-10-CM | POA: Diagnosis not present

## 2018-02-01 DIAGNOSIS — M1990 Unspecified osteoarthritis, unspecified site: Secondary | ICD-10-CM | POA: Insufficient documentation

## 2018-02-01 DIAGNOSIS — E1122 Type 2 diabetes mellitus with diabetic chronic kidney disease: Secondary | ICD-10-CM | POA: Diagnosis not present

## 2018-02-01 DIAGNOSIS — I130 Hypertensive heart and chronic kidney disease with heart failure and stage 1 through stage 4 chronic kidney disease, or unspecified chronic kidney disease: Secondary | ICD-10-CM | POA: Diagnosis not present

## 2018-02-01 DIAGNOSIS — D649 Anemia, unspecified: Secondary | ICD-10-CM | POA: Insufficient documentation

## 2018-02-01 DIAGNOSIS — N189 Chronic kidney disease, unspecified: Secondary | ICD-10-CM | POA: Insufficient documentation

## 2018-02-01 DIAGNOSIS — Z79899 Other long term (current) drug therapy: Secondary | ICD-10-CM | POA: Diagnosis not present

## 2018-02-01 DIAGNOSIS — I509 Heart failure, unspecified: Secondary | ICD-10-CM | POA: Insufficient documentation

## 2018-02-01 DIAGNOSIS — I5032 Chronic diastolic (congestive) heart failure: Secondary | ICD-10-CM

## 2018-02-01 DIAGNOSIS — E079 Disorder of thyroid, unspecified: Secondary | ICD-10-CM | POA: Insufficient documentation

## 2018-02-01 DIAGNOSIS — N183 Chronic kidney disease, stage 3 unspecified: Secondary | ICD-10-CM

## 2018-02-01 DIAGNOSIS — M109 Gout, unspecified: Secondary | ICD-10-CM | POA: Insufficient documentation

## 2018-02-01 DIAGNOSIS — Z853 Personal history of malignant neoplasm of breast: Secondary | ICD-10-CM | POA: Insufficient documentation

## 2018-02-01 DIAGNOSIS — Z79890 Hormone replacement therapy (postmenopausal): Secondary | ICD-10-CM | POA: Insufficient documentation

## 2018-02-01 NOTE — Progress Notes (Signed)
Patient ID: Nicole Schmidt, female    DOB: Sep 12, 1965, 52 y.o.   MRN: [id number]  HPI  Nicole Schmidt is a 52 y/o female with a history of breast cancer, DM, HTN, CKD, thyroid disease, anemia, gout, pulmonary embolus and chronic heart failure.   Echo report from 11/20/17 reviewed and showed an EF of 55-60%.  Was in the ED 01/12/18 due to peripheral edema where she was treated and released. Admitted 11/15/17 due to acute renal failure. IV fluids were given along with an episode of dialysis. Discharged after 6 days.   She presents today for her initial visit with a chief complaint of minimal shortness of breath upon moderate exertion. She describes this as chronic in nature having been present for several months. She has associated pedal edema and difficulty sleeping along with this. She denies any abdominal distention, palpitations, chest pain, cough, dizziness, fatigue or weight gain.   Past Medical History:  Diagnosis Date  . Anemia   . Arthritis   . Breast cancer of upper-inner quadrant of right female breast ([place])    Right breast invasive CA and DCIS , 7 mm T1,N0,M0. Er/PR pos, her 2 negative.  Margins 1 mm.  . CHF (congestive heart failure) ([place])   . Diabetes mellitus without complication ([place])   . Gout   . Gout   . Hypertension   . Menopause    age 3  . Personal history of radiation therapy   . Pulmonary embolism ([place]) 10/2014  . Renal insufficiency   . Thyroid goiter    Past Surgical History:  Procedure Laterality Date  . BREAST BIOPSY Right 2014   breast ca  . BREAST EXCISIONAL BIOPSY Right 07/19/2012   breast ca  . BREAST SURGERY Right 2014   with sentinel node bx subareaolar duct excision  . COLONOSCOPY WITH PROPOFOL N/A 01/30/2017   Procedure: COLONOSCOPY WITH PROPOFOL;  Surgeon: Christene Lye, MD;  Location: [facility] ENDOSCOPY;  Service: Endoscopy;  Laterality: N/A;   Family History  Problem Relation Age of Onset  . Diabetes Father   . Hypertension Father   .  Parkinson's disease Father   . Hypertension Mother   . Rheum arthritis Mother   . Atrial fibrillation Mother   . Heart failure Mother   . Colon cancer Cousin 71  . Breast cancer Neg Hx    Social History   Tobacco Use  . Smoking status: Never Smoker  . Smokeless tobacco: Never Used  Substance Use Topics  . Alcohol use: No    Alcohol/week: 0.0 standard drinks   No Known Allergies Prior to Admission medications   Medication Sig Start Date End Date Taking? Authorizing Provider  ALPRAZolam Duanne Moron) 0.5 MG tablet Take 0.5 mg by mouth as needed for anxiety.    Yes [provider]  Ascorbic Acid (VITAMIN C PO) Take 1 tablet by mouth 2 (two) times daily.    Yes [provider]  atorvastatin (LIPITOR) 20 MG tablet Take 20 mg by mouth at bedtime.    Yes [provider]  calcium carbonate (OSCAL) 1500 (600 Ca) MG TABS tablet Take 600 mg of elemental calcium by mouth 2 (two) times daily with a meal.   Yes [provider]  Cholecalciferol (VITAMIN D3) 50000 units TABS Take 1 tablet by mouth once a week.   Yes [provider]  CINNAMON PO Take 1,000 mg by mouth 2 (two) times daily.    Yes [provider]  cloNIDine (CATAPRES) 0.2 MG tablet  Take 0.2 mg by mouth daily.   Yes [provider]  Dulaglutide (TRULICITY) 1.5 BJ/6.2GB SOPN Inject 1.5 mg into the skin every Monday. night   Yes [provider]  ELIQUIS 5 MG TABS tablet TAKE 1 TABLET TWICE A DAY Patient taking differently: Take 5 mg by mouth 2 (two) times daily.  12/24/17  Yes Karen Kitchens, NP  ferrous gluconate (FERGON) 240 (27 FE) MG tablet Take 240 mg by mouth 2 (two) times daily.   Yes [provider]  folic acid (FOLVITE) 151 MCG tablet Take 800 mcg by mouth daily.    Yes [provider]  furosemide (LASIX) 20 MG tablet Take 20 mg by mouth daily.   Yes [provider]  letrozole (FEMARA) 2.5 MG tablet TAKE 1 TABLET DAILY Patient taking  differently: Take 2.5 mg by mouth daily.  12/24/17  Yes Karen Kitchens, NP  levothyroxine (SYNTHROID, LEVOTHROID) 112 MCG tablet Take 112 mcg daily before breakfast by mouth.   Yes [provider]  sertraline (ZOLOFT) 50 MG tablet Take 50 mg by mouth daily.   Yes [provider]    Review of Systems  Constitutional: Negative for appetite change and fatigue.  HENT: Negative for congestion, postnasal drip and sore throat.   Eyes: Negative.   Respiratory: Positive for shortness of breath (improving). Negative for cough.   Cardiovascular: Positive for leg swelling. Negative for chest pain and palpitations.  Gastrointestinal: Negative for abdominal distention and abdominal pain.  Endocrine: Negative.   Genitourinary: Negative.   Musculoskeletal: Positive for arthralgias (feet at times). Negative for back pain.  Skin: Negative.   Allergic/Immunologic: Negative.   Neurological: Negative for dizziness and light-headedness.  Hematological: Negative for adenopathy. Does not bruise/bleed easily.  Psychiatric/Behavioral: Positive for sleep disturbance (sleeping better since back at work; sleeping on wedge). Negative for dysphoric mood. The patient is not nervous/anxious.    Vitals:   02/01/18 1157  BP: 122/70  Pulse: 84  Resp: 18  SpO2: 90%  Weight: (!) 331 lb 2 oz (150.2 kg)  Height: 5\' 4"  (1.626 m)   Wt Readings from Last 3 Encounters:  02/01/18 (!) 331 lb 2 oz (150.2 kg)  01/12/18 (!) 338 lb (153.3 kg)  12/25/17 (!) 338 lb (153.3 kg)   Lab Results  Component Value Date   CREATININE 1.31 (H) 01/12/2018   CREATININE 1.61 (H) 01/08/2018   CREATININE 2.62 (H) 11/21/2017   Physical Exam Vitals signs and nursing note reviewed.  Constitutional:      Appearance: She is well-developed.  HENT:     Head: Normocephalic and atraumatic.  Cardiovascular:     Rate and Rhythm: Normal rate and regular rhythm.  Pulmonary:     Effort: Pulmonary effort is normal.     Breath  sounds: Normal breath sounds. No wheezing or rales.  Abdominal:     Palpations: Abdomen is soft.     Tenderness: There is no abdominal tenderness.  Musculoskeletal:     Right lower leg: She exhibits no tenderness. Edema (2+ pitting) present.     Left lower leg: She exhibits no tenderness. Edema (2+ pitting) present.     Comments: Right hand edema present (had lymph node removal 2014 in right arm)  Skin:    General: Skin is warm and dry.  Neurological:     General: No focal deficit present.     Mental Status: She is alert and oriented to person, place, and time.  Psychiatric:  Mood and Affect: Mood normal.        Behavior: Behavior normal.     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 - report receiving her flu vaccine for this season  2: HTN- - BP looks good today - BMP from 01/12/18 reviewed and showed sodium 145, potassium 4.3, creatinine 1.31 and GFR 46; she says that she had lab work at [employer] office yesterday - follows with PCP [facility]) and sees him next week  3: CKD-  - follows closely with nephrology Holley Raring) and sees him the 1st week of January 2020  4: Lymphedema- - stage 2 - not elevating her legs much during the day because she's on her feet at work standing on concrete - encouraged her to elevate her legs on her breaks/ lunch time and then when she gets home - instructed her to get compression socks and wear them daily with  removal at bedtime - limited in her ability to exercise due to her work schedule  - consider lymphapress compression boots if edema persists  Medication list was reviewed.  Return in 6 weeks or sooner for any questions/problems before then.

## 2018-02-01 NOTE — Patient Instructions (Addendum)
Resume weighing daily and call for an overnight weight gain of > 2 pounds or a weekly weight gain of >5 pounds. 

## 2018-02-04 ENCOUNTER — Encounter: Payer: Self-pay | Admitting: Family

## 2018-02-04 DIAGNOSIS — I5033 Acute on chronic diastolic (congestive) heart failure: Secondary | ICD-10-CM | POA: Insufficient documentation

## 2018-02-04 DIAGNOSIS — I890 Lymphedema, not elsewhere classified: Secondary | ICD-10-CM | POA: Insufficient documentation

## 2018-02-04 DIAGNOSIS — I5032 Chronic diastolic (congestive) heart failure: Secondary | ICD-10-CM | POA: Insufficient documentation

## 2018-03-14 NOTE — Progress Notes (Signed)
Patient ID: Nicole Schmidt, female    DOB: 05-02-1965, 53 y.o.   MRN: [id number]  HPI  [name] is a 53 y/o female with a history of breast cancer, DM, HTN, CKD, thyroid disease, anemia, gout, pulmonary embolus and chronic heart failure.   Echo report from 11/20/17 reviewed and showed an EF of 55-60%.  Was in the ED 01/12/18 due to peripheral edema where she was treated and released. Admitted 11/15/17 due to acute renal failure. IV fluids were given along with an episode of dialysis. Discharged after 6 days.   She presents today for a follow-up visit with a chief complaint of minimal shortness of breath upon moderate exertion. She describes this as chronic in nature having been present for several years. She has associated pedal edema and difficulty sleeping along with this. She denies any dizziness, abdominal distention, palpitations, chest pain, abdominal distention, fatigue or weight gain. She feels like her edema and breathing have improved since she was last here.   Past Medical History:  Diagnosis Date  . Anemia   . Arthritis   . Breast cancer of upper-inner quadrant of right female breast ([place])    Right breast invasive CA and DCIS , 7 mm T1,N0,M0. Er/PR pos, her 2 negative.  Margins 1 mm.  . CHF (congestive heart failure) ([place])   . Diabetes mellitus without complication ([place])   . Gout   . Gout   . Hypertension   . Menopause    age 22  . Personal history of radiation therapy   . Pulmonary embolism ([place]) 10/2014  . Renal insufficiency   . Thyroid goiter    Past Surgical History:  Procedure Laterality Date  . BREAST BIOPSY Right 2014   breast ca  . BREAST EXCISIONAL BIOPSY Right 07/19/2012   breast ca  . BREAST SURGERY Right 2014   with sentinel node bx subareaolar duct excision  . COLONOSCOPY WITH PROPOFOL N/A 01/30/2017   Procedure: COLONOSCOPY WITH PROPOFOL;  Surgeon: Christene Lye, MD;  Location: [facility] ENDOSCOPY;  Service: Endoscopy;  Laterality: N/A;   Family  History  Problem Relation Age of Onset  . Diabetes Father   . Hypertension Father   . Parkinson's disease Father   . Hypertension Mother   . Rheum arthritis Mother   . Atrial fibrillation Mother   . Heart failure Mother   . Colon cancer Cousin 69  . Breast cancer Neg Hx    Social History   Tobacco Use  . Smoking status: Never Smoker  . Smokeless tobacco: Never Used  Substance Use Topics  . Alcohol use: No    Alcohol/week: 0.0 standard drinks   No Known Allergies  Prior to Admission medications   Medication Sig Start Date End Date Taking? Authorizing Provider  ALPRAZolam Duanne Moron) 0.5 MG tablet Take 0.5 mg by mouth as needed for anxiety.    Yes [provider]  Ascorbic Acid (VITAMIN C PO) Take 1 tablet by mouth 2 (two) times daily.    Yes [provider]  atorvastatin (LIPITOR) 20 MG tablet Take 20 mg by mouth at bedtime.    Yes [provider]  calcium carbonate (OSCAL) 1500 (600 Ca) MG TABS tablet Take 600 mg of elemental calcium by mouth 2 (two) times daily with a meal.   Yes [provider]  Cholecalciferol (VITAMIN D3) 50000 units TABS Take 1 tablet by mouth once a week.   Yes [provider]  CINNAMON PO Take 1,000 mg by mouth 2 (  two) times daily.    Yes [provider]  cloNIDine (CATAPRES) 0.2 MG tablet Take 0.2 mg by mouth daily.   Yes [provider]  Dulaglutide (TRULICITY) 1.5 GD/9.2EQ SOPN Inject 1.5 mg into the skin every Monday. night   Yes [provider]  ELIQUIS 5 MG TABS tablet TAKE 1 TABLET TWICE A DAY Patient taking differently: Take 5 mg by mouth 2 (two) times daily.  12/24/17  Yes Karen Kitchens, NP  ferrous gluconate (FERGON) 240 (27 FE) MG tablet Take 240 mg by mouth 2 (two) times daily.   Yes [provider]  folic acid (FOLVITE) 683 MCG tablet Take 800 mcg by mouth daily.    Yes [provider]  furosemide (LASIX) 20 MG tablet Take 20 mg by mouth daily.   Yes  [provider]  letrozole (FEMARA) 2.5 MG tablet TAKE 1 TABLET DAILY Patient taking differently: Take 2.5 mg by mouth daily.  12/24/17  Yes Karen Kitchens, NP  levothyroxine (SYNTHROID, LEVOTHROID) 112 MCG tablet Take 112 mcg daily before breakfast by mouth.   Yes [provider]  sertraline (ZOLOFT) 50 MG tablet Take 50 mg by mouth daily.   Yes [provider]    Review of Systems  Constitutional: Negative for appetite change and fatigue.  HENT: Negative for congestion, postnasal drip and sore throat.   Eyes: Negative.   Respiratory: Positive for shortness of breath (improving). Negative for cough.   Cardiovascular: Positive for leg swelling. Negative for chest pain and palpitations.  Gastrointestinal: Negative for abdominal distention and abdominal pain.  Endocrine: Negative.   Genitourinary: Negative.   Musculoskeletal: Positive for arthralgias (feet at times). Negative for back pain.  Skin: Negative.   Allergic/Immunologic: Negative.   Neurological: Negative for dizziness and light-headedness.  Hematological: Negative for adenopathy. Does not bruise/bleed easily.  Psychiatric/Behavioral: Positive for sleep disturbance (sleeping better since back at work; sleeping on wedge). Negative for dysphoric mood. The patient is not nervous/anxious.    Vitals:   03/15/18 1149  BP: (!) 142/80  Pulse: 72  Resp: 18  SpO2: 95%  Weight: (!) 325 lb (147.4 kg)  Height: 5\' 4"  (1.626 m)   Wt Readings from Last 3 Encounters:  03/15/18 (!) 325 lb (147.4 kg)  02/01/18 (!) 331 lb 2 oz (150.2 kg)  01/12/18 (!) 338 lb (153.3 kg)   Lab Results  Component Value Date   CREATININE 1.31 (H) 01/12/2018   CREATININE 1.61 (H) 01/08/2018   CREATININE 2.62 (H) 11/21/2017    Physical Exam Vitals signs and nursing note reviewed.  Constitutional:      Appearance: She is well-developed.  HENT:     Head: Normocephalic and atraumatic.  Cardiovascular:     Rate and Rhythm:  Normal rate and regular rhythm.  Pulmonary:     Effort: Pulmonary effort is normal.     Breath sounds: Normal breath sounds. No wheezing or rales.  Abdominal:     Palpations: Abdomen is soft.     Tenderness: There is no abdominal tenderness.  Musculoskeletal:     Right lower leg: She exhibits no tenderness. Edema (1+ pitting) present.     Left lower leg: She exhibits no tenderness. Edema (1+ pitting) present.     Comments: (had lymph node removal 2014 in right arm)  Skin:    General: Skin is warm and dry.  Neurological:     General: No focal deficit present.     Mental Status: She is alert and oriented  to person, place, and time.  Psychiatric:        Mood and Affect: Mood normal.        Behavior: Behavior normal.   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 - report receiving her flu vaccine for this season  2: HTN- - BP looks good today - BMP from 01/12/18 reviewed and showed sodium 145, potassium 4.3, creatinine 1.31 and GFR 46; she says that she had lab work at [employer] office yesterday - follows with PCP Ronnald Collum)   3: CKD-  - follows closely with nephrology Holley Raring) and saw him the 1st week of January 2020  4: Lymphedema- - stage 2 - not elevating her legs much during the day because she's on her feet at work standing on concrete - encouraged her to elevate her legs on her breaks/ lunch time and then when she gets home - wearing compression socks daily with removal at bedtime  and she feels like her edema is better - limited in her ability to exercise due to her work schedule  - consider lymphapress compression boots if edema persists  Medication list was reviewed.  Return in 6 months or sooner for any questions/problems before then.

## 2018-03-15 ENCOUNTER — Encounter: Payer: Self-pay | Admitting: Family

## 2018-03-15 ENCOUNTER — Ambulatory Visit: Payer: BLUE CROSS/BLUE SHIELD | Attending: Family | Admitting: Family

## 2018-03-15 VITALS — BP 142/80 | HR 72 | Resp 18 | Ht 64.0 in | Wt 325.0 lb

## 2018-03-15 DIAGNOSIS — N183 Chronic kidney disease, stage 3 unspecified: Secondary | ICD-10-CM

## 2018-03-15 DIAGNOSIS — N189 Chronic kidney disease, unspecified: Secondary | ICD-10-CM | POA: Insufficient documentation

## 2018-03-15 DIAGNOSIS — E049 Nontoxic goiter, unspecified: Secondary | ICD-10-CM | POA: Insufficient documentation

## 2018-03-15 DIAGNOSIS — E079 Disorder of thyroid, unspecified: Secondary | ICD-10-CM | POA: Diagnosis not present

## 2018-03-15 DIAGNOSIS — I890 Lymphedema, not elsewhere classified: Secondary | ICD-10-CM | POA: Insufficient documentation

## 2018-03-15 DIAGNOSIS — Z7901 Long term (current) use of anticoagulants: Secondary | ICD-10-CM | POA: Diagnosis not present

## 2018-03-15 DIAGNOSIS — Z8249 Family history of ischemic heart disease and other diseases of the circulatory system: Secondary | ICD-10-CM | POA: Insufficient documentation

## 2018-03-15 DIAGNOSIS — M1990 Unspecified osteoarthritis, unspecified site: Secondary | ICD-10-CM | POA: Insufficient documentation

## 2018-03-15 DIAGNOSIS — D649 Anemia, unspecified: Secondary | ICD-10-CM | POA: Insufficient documentation

## 2018-03-15 DIAGNOSIS — R0602 Shortness of breath: Secondary | ICD-10-CM | POA: Insufficient documentation

## 2018-03-15 DIAGNOSIS — I5032 Chronic diastolic (congestive) heart failure: Secondary | ICD-10-CM | POA: Diagnosis not present

## 2018-03-15 DIAGNOSIS — M109 Gout, unspecified: Secondary | ICD-10-CM | POA: Diagnosis not present

## 2018-03-15 DIAGNOSIS — I130 Hypertensive heart and chronic kidney disease with heart failure and stage 1 through stage 4 chronic kidney disease, or unspecified chronic kidney disease: Secondary | ICD-10-CM | POA: Insufficient documentation

## 2018-03-15 DIAGNOSIS — Z86711 Personal history of pulmonary embolism: Secondary | ICD-10-CM | POA: Insufficient documentation

## 2018-03-15 DIAGNOSIS — I10 Essential (primary) hypertension: Secondary | ICD-10-CM

## 2018-03-15 DIAGNOSIS — Z79899 Other long term (current) drug therapy: Secondary | ICD-10-CM | POA: Diagnosis not present

## 2018-03-15 DIAGNOSIS — E1122 Type 2 diabetes mellitus with diabetic chronic kidney disease: Secondary | ICD-10-CM | POA: Diagnosis not present

## 2018-03-15 DIAGNOSIS — G479 Sleep disorder, unspecified: Secondary | ICD-10-CM | POA: Diagnosis not present

## 2018-03-15 NOTE — Patient Instructions (Signed)
Continue weighing daily and call for an overnight weight gain of > 2 pounds or a weekly weight gain of >5 pounds. 

## 2018-03-18 ENCOUNTER — Ambulatory Visit: Payer: BLUE CROSS/BLUE SHIELD | Admitting: Family

## 2018-03-20 ENCOUNTER — Ambulatory Visit: Payer: BLUE CROSS/BLUE SHIELD | Admitting: Family

## 2018-03-27 ENCOUNTER — Other Ambulatory Visit: Payer: Self-pay

## 2018-03-27 DIAGNOSIS — Z170 Estrogen receptor positive status [ER+]: Principal | ICD-10-CM

## 2018-03-27 DIAGNOSIS — C50911 Malignant neoplasm of unspecified site of right female breast: Secondary | ICD-10-CM

## 2018-03-27 DIAGNOSIS — D509 Iron deficiency anemia, unspecified: Secondary | ICD-10-CM

## 2018-03-29 ENCOUNTER — Ambulatory Visit
Admission: RE | Admit: 2018-03-29 | Discharge: 2018-03-29 | Disposition: A | Discharge status: Home / Self Care | Payer: BLUE CROSS/BLUE SHIELD | Source: Ambulatory Visit | Attending: Urgent Care | Admitting: Urgent Care

## 2018-03-29 ENCOUNTER — Other Ambulatory Visit: Payer: Self-pay

## 2018-03-29 ENCOUNTER — Inpatient Hospital Stay: Payer: BLUE CROSS/BLUE SHIELD | Attending: Hematology and Oncology | Admitting: Hematology and Oncology

## 2018-03-29 ENCOUNTER — Encounter: Payer: Self-pay | Admitting: Hematology and Oncology

## 2018-03-29 ENCOUNTER — Encounter: Payer: Self-pay | Admitting: Emergency Medicine

## 2018-03-29 ENCOUNTER — Inpatient Hospital Stay: Payer: BLUE CROSS/BLUE SHIELD

## 2018-03-29 ENCOUNTER — Inpatient Hospital Stay
Admission: EM | Admit: 2018-03-29 | Discharge: 2018-04-03 | DRG: 193 | Disposition: A | Discharge status: Home / Self Care | Payer: BLUE CROSS/BLUE SHIELD | Attending: Internal Medicine | Admitting: Internal Medicine

## 2018-03-29 VITALS — BP 214/67 | HR 67 | Temp 100.0°F | Resp 18 | Ht 64.0 in | Wt 319.9 lb

## 2018-03-29 DIAGNOSIS — R05 Cough: Secondary | ICD-10-CM | POA: Insufficient documentation

## 2018-03-29 DIAGNOSIS — J9601 Acute respiratory failure with hypoxia: Secondary | ICD-10-CM | POA: Diagnosis present

## 2018-03-29 DIAGNOSIS — Z6841 Body Mass Index (BMI) 40.0 and over, adult: Secondary | ICD-10-CM

## 2018-03-29 DIAGNOSIS — D509 Iron deficiency anemia, unspecified: Secondary | ICD-10-CM

## 2018-03-29 DIAGNOSIS — E039 Hypothyroidism, unspecified: Secondary | ICD-10-CM | POA: Diagnosis present

## 2018-03-29 DIAGNOSIS — B379 Candidiasis, unspecified: Secondary | ICD-10-CM | POA: Diagnosis not present

## 2018-03-29 DIAGNOSIS — Z7901 Long term (current) use of anticoagulants: Secondary | ICD-10-CM

## 2018-03-29 DIAGNOSIS — Z923 Personal history of irradiation: Secondary | ICD-10-CM | POA: Insufficient documentation

## 2018-03-29 DIAGNOSIS — E1122 Type 2 diabetes mellitus with diabetic chronic kidney disease: Secondary | ICD-10-CM | POA: Diagnosis present

## 2018-03-29 DIAGNOSIS — Z7951 Long term (current) use of inhaled steroids: Secondary | ICD-10-CM

## 2018-03-29 DIAGNOSIS — C50911 Malignant neoplasm of unspecified site of right female breast: Secondary | ICD-10-CM

## 2018-03-29 DIAGNOSIS — Z170 Estrogen receptor positive status [ER+]: Principal | ICD-10-CM

## 2018-03-29 DIAGNOSIS — R6889 Other general symptoms and signs: Secondary | ICD-10-CM

## 2018-03-29 DIAGNOSIS — C50211 Malignant neoplasm of upper-inner quadrant of right female breast: Secondary | ICD-10-CM | POA: Diagnosis present

## 2018-03-29 DIAGNOSIS — Z86711 Personal history of pulmonary embolism: Secondary | ICD-10-CM

## 2018-03-29 DIAGNOSIS — I130 Hypertensive heart and chronic kidney disease with heart failure and stage 1 through stage 4 chronic kidney disease, or unspecified chronic kidney disease: Secondary | ICD-10-CM | POA: Diagnosis present

## 2018-03-29 DIAGNOSIS — R058 Other specified cough: Secondary | ICD-10-CM

## 2018-03-29 DIAGNOSIS — Z79899 Other long term (current) drug therapy: Secondary | ICD-10-CM

## 2018-03-29 DIAGNOSIS — I10 Essential (primary) hypertension: Secondary | ICD-10-CM | POA: Diagnosis not present

## 2018-03-29 DIAGNOSIS — J101 Influenza due to other identified influenza virus with other respiratory manifestations: Secondary | ICD-10-CM | POA: Diagnosis not present

## 2018-03-29 DIAGNOSIS — E119 Type 2 diabetes mellitus without complications: Secondary | ICD-10-CM | POA: Insufficient documentation

## 2018-03-29 DIAGNOSIS — R748 Abnormal levels of other serum enzymes: Secondary | ICD-10-CM | POA: Diagnosis not present

## 2018-03-29 DIAGNOSIS — J441 Chronic obstructive pulmonary disease with (acute) exacerbation: Secondary | ICD-10-CM | POA: Diagnosis present

## 2018-03-29 DIAGNOSIS — Z79811 Long term (current) use of aromatase inhibitors: Secondary | ICD-10-CM | POA: Insufficient documentation

## 2018-03-29 DIAGNOSIS — Z833 Family history of diabetes mellitus: Secondary | ICD-10-CM

## 2018-03-29 DIAGNOSIS — N183 Chronic kidney disease, stage 3 (moderate): Secondary | ICD-10-CM | POA: Diagnosis present

## 2018-03-29 DIAGNOSIS — Z8249 Family history of ischemic heart disease and other diseases of the circulatory system: Secondary | ICD-10-CM

## 2018-03-29 DIAGNOSIS — B372 Candidiasis of skin and nail: Secondary | ICD-10-CM

## 2018-03-29 DIAGNOSIS — I5032 Chronic diastolic (congestive) heart failure: Secondary | ICD-10-CM | POA: Diagnosis present

## 2018-03-29 DIAGNOSIS — Z7189 Other specified counseling: Secondary | ICD-10-CM

## 2018-03-29 DIAGNOSIS — I2782 Chronic pulmonary embolism: Secondary | ICD-10-CM

## 2018-03-29 LAB — COMPREHENSIVE METABOLIC PANEL
ALT: 22 U/L (ref 0–44)
AST: 28 U/L (ref 15–41)
Albumin: 3.5 g/dL (ref 3.5–5.0)
Alkaline Phosphatase: 109 U/L (ref 38–126)
Anion gap: 7 (ref 5–15)
BUN: 29 mg/dL — ABNORMAL HIGH (ref 6–20)
CO2: 29 mmol/L (ref 22–32)
Calcium: 8.7 mg/dL — ABNORMAL LOW (ref 8.9–10.3)
Chloride: 108 mmol/L (ref 98–111)
Creatinine, Ser: 1.4 mg/dL — ABNORMAL HIGH (ref 0.44–1.00)
GFR calc Af Amer: 49 mL/min — ABNORMAL LOW (ref >60)
GFR calc non Af Amer: 42 mL/min — ABNORMAL LOW (ref >60)
Glucose, Bld: 98 mg/dL (ref 70–99)
Potassium: 4.1 mmol/L (ref 3.5–5.1)
Sodium: 144 mmol/L (ref 135–145)
Total Bilirubin: 0.4 mg/dL (ref 0.3–1.2)
Total Protein: 7 g/dL (ref 6.5–8.1)

## 2018-03-29 LAB — FERRITIN: Ferritin: 110 ng/mL (ref 11–307)

## 2018-03-29 LAB — CBC WITH DIFFERENTIAL/PLATELET
Abs Immature Granulocytes: 0.01 10*3/uL (ref 0.00–0.07)
Basophils Absolute: 0 10*3/uL (ref 0.0–0.1)
Basophils Relative: 0 %
Eosinophils Absolute: 0.2 10*3/uL (ref 0.0–0.5)
Eosinophils Relative: 4 %
HCT: 41.4 % (ref 36.0–46.0)
Hemoglobin: 12.1 g/dL (ref 12.0–15.0)
Immature Granulocytes: 0 %
Lymphocytes Relative: 31 %
Lymphs Abs: 1.8 10*3/uL (ref 0.7–4.0)
MCH: 25.7 pg — ABNORMAL LOW (ref 26.0–34.0)
MCHC: 29.2 g/dL — ABNORMAL LOW (ref 30.0–36.0)
MCV: 87.9 fL (ref 80.0–100.0)
Monocytes Absolute: 0.7 10*3/uL (ref 0.1–1.0)
Monocytes Relative: 12 %
Neutro Abs: 3.1 10*3/uL (ref 1.7–7.7)
Neutrophils Relative %: 53 %
Platelets: 195 10*3/uL (ref 150–400)
RBC: 4.71 MIL/uL (ref 3.87–5.11)
RDW: 14.9 % (ref 11.5–15.5)
WBC: 5.9 10*3/uL (ref 4.0–10.5)
nRBC: 0 % (ref 0.0–0.2)

## 2018-03-29 LAB — INFLUENZA PANEL BY PCR (TYPE A & B)
Influenza A By PCR: POSITIVE — AB
Influenza B By PCR: NEGATIVE

## 2018-03-29 LAB — GLUCOSE, CAPILLARY: Glucose-Capillary: 152 mg/dL — ABNORMAL HIGH (ref 70–99)

## 2018-03-29 LAB — MAGNESIUM: Magnesium: 1.9 mg/dL (ref 1.7–2.4)

## 2018-03-29 LAB — PHOSPHORUS: Phosphorus: 3.8 mg/dL (ref 2.5–4.6)

## 2018-03-29 MED ORDER — ACETAMINOPHEN 325 MG PO TABS
650.0000 mg | ORAL_TABLET | Freq: Four times a day (QID) | ORAL | Status: DC | PRN
  Administered 2018-04-01: 650 mg via ORAL
  Filled 2018-03-29: qty 2

## 2018-03-29 MED ORDER — INSULIN ASPART 100 UNIT/ML ~~LOC~~ SOLN: 0.0000 [IU] | Freq: Every day | SUBCUTANEOUS | Status: DC

## 2018-03-29 MED ORDER — FERROUS GLUCONATE 324 (38 FE) MG PO TABS
324.0000 mg | ORAL_TABLET | Freq: Two times a day (BID) | ORAL | Status: DC
  Administered 2018-03-30 – 2018-04-03 (×10): 324 mg via ORAL
  Filled 2018-03-29 (×11): qty 1

## 2018-03-29 MED ORDER — ONDANSETRON HCL 4 MG/2ML IJ SOLN: 4.0000 mg | Freq: Four times a day (QID) | INTRAMUSCULAR | Status: DC | PRN

## 2018-03-29 MED ORDER — SENNOSIDES-DOCUSATE SODIUM 8.6-50 MG PO TABS: 1.0000 | ORAL_TABLET | Freq: Every evening | ORAL | Status: DC | PRN

## 2018-03-29 MED ORDER — IPRATROPIUM-ALBUTEROL 0.5-2.5 (3) MG/3ML IN SOLN
3.0000 mL | Freq: Once | RESPIRATORY_TRACT | Status: AC
  Administered 2018-03-29: 3 mL via RESPIRATORY_TRACT
  Filled 2018-03-29: qty 3

## 2018-03-29 MED ORDER — LEVOTHYROXINE SODIUM 112 MCG PO TABS
112.0000 ug | ORAL_TABLET | Freq: Every day | ORAL | Status: DC
  Administered 2018-03-30 – 2018-04-03 (×5): 112 ug via ORAL
  Filled 2018-03-29 (×5): qty 1

## 2018-03-29 MED ORDER — APIXABAN 5 MG PO TABS
5.0000 mg | ORAL_TABLET | Freq: Two times a day (BID) | ORAL | Status: DC
  Administered 2018-03-30 – 2018-04-03 (×10): 5 mg via ORAL
  Filled 2018-03-29 (×10): qty 1

## 2018-03-29 MED ORDER — INSULIN ASPART 100 UNIT/ML ~~LOC~~ SOLN
0.0000 [IU] | Freq: Three times a day (TID) | SUBCUTANEOUS | Status: DC
  Administered 2018-03-30 (×2): 2 [IU] via SUBCUTANEOUS
  Administered 2018-03-30 – 2018-04-02 (×4): 1 [IU] via SUBCUTANEOUS
  Filled 2018-03-29 (×6): qty 1

## 2018-03-29 MED ORDER — CLONIDINE HCL 0.1 MG PO TABS
0.2000 mg | ORAL_TABLET | Freq: Once | ORAL | Status: AC
  Administered 2018-03-29: 0.2 mg via ORAL
  Filled 2018-03-29: qty 2

## 2018-03-29 MED ORDER — LACTATED RINGERS IV SOLN: INTRAVENOUS | Status: AC

## 2018-03-29 MED ORDER — ONDANSETRON HCL 4 MG PO TABS: 4.0000 mg | ORAL_TABLET | Freq: Four times a day (QID) | ORAL | Status: DC | PRN

## 2018-03-29 MED ORDER — ATORVASTATIN CALCIUM 20 MG PO TABS
20.0000 mg | ORAL_TABLET | Freq: Every day | ORAL | Status: DC
  Administered 2018-03-30 – 2018-04-02 (×5): 20 mg via ORAL
  Filled 2018-03-29 (×5): qty 1

## 2018-03-29 MED ORDER — NYSTATIN 100000 UNIT/GM EX POWD: CUTANEOUS | 1 refills | Status: DC

## 2018-03-29 MED ORDER — ALPRAZOLAM 0.5 MG PO TABS: 0.5000 mg | ORAL_TABLET | ORAL | Status: DC | PRN

## 2018-03-29 MED ORDER — ALBUTEROL SULFATE (2.5 MG/3ML) 0.083% IN NEBU
5.0000 mg | INHALATION_SOLUTION | Freq: Once | RESPIRATORY_TRACT | Status: AC
  Administered 2018-03-29: 5 mg via RESPIRATORY_TRACT
  Filled 2018-03-29: qty 6

## 2018-03-29 MED ORDER — OSELTAMIVIR PHOSPHATE 75 MG PO CAPS
75.0000 mg | ORAL_CAPSULE | Freq: Once | ORAL | Status: AC
  Administered 2018-03-29: 75 mg via ORAL
  Filled 2018-03-29: qty 1

## 2018-03-29 MED ORDER — SERTRALINE HCL 50 MG PO TABS
50.0000 mg | ORAL_TABLET | Freq: Every day | ORAL | Status: DC
  Administered 2018-03-30 – 2018-04-03 (×5): 50 mg via ORAL
  Filled 2018-03-29 (×5): qty 1

## 2018-03-29 MED ORDER — METHYLPREDNISOLONE SODIUM SUCC 125 MG IJ SOLR
60.0000 mg | Freq: Two times a day (BID) | INTRAMUSCULAR | Status: DC
  Administered 2018-03-30: 60 mg via INTRAVENOUS
  Filled 2018-03-29: qty 2

## 2018-03-29 MED ORDER — CLONIDINE HCL 0.1 MG PO TABS
0.2000 mg | ORAL_TABLET | Freq: Three times a day (TID) | ORAL | Status: DC
  Administered 2018-03-30 (×4): 0.2 mg via ORAL
  Filled 2018-03-29 (×4): qty 2

## 2018-03-29 MED ORDER — FUROSEMIDE 20 MG PO TABS
20.0000 mg | ORAL_TABLET | Freq: Every day | ORAL | Status: DC
  Administered 2018-03-30 – 2018-03-31 (×2): 20 mg via ORAL
  Filled 2018-03-29 (×2): qty 1

## 2018-03-29 MED ORDER — FOLIC ACID 1 MG PO TABS
1.0000 mg | ORAL_TABLET | Freq: Every day | ORAL | Status: DC
  Administered 2018-03-30 – 2018-04-03 (×5): 1 mg via ORAL
  Filled 2018-03-29 (×5): qty 1

## 2018-03-29 MED ORDER — SODIUM CHLORIDE 0.9 % IV BOLUS
1000.0000 mL | Freq: Once | INTRAVENOUS | Status: AC
  Administered 2018-03-29: 1000 mL via INTRAVENOUS

## 2018-03-29 MED ORDER — BISACODYL 5 MG PO TBEC: 5.0000 mg | DELAYED_RELEASE_TABLET | Freq: Every day | ORAL | Status: DC | PRN

## 2018-03-29 MED ORDER — LETROZOLE 2.5 MG PO TABS
2.5000 mg | ORAL_TABLET | Freq: Every day | ORAL | Status: DC
  Administered 2018-03-31 – 2018-04-02 (×3): 2.5 mg via ORAL
  Filled 2018-03-29 (×5): qty 1

## 2018-03-29 MED ORDER — OSELTAMIVIR PHOSPHATE 75 MG PO CAPS
75.0000 mg | ORAL_CAPSULE | Freq: Two times a day (BID) | ORAL | Status: DC
  Administered 2018-03-30 – 2018-04-03 (×9): 75 mg via ORAL
  Filled 2018-03-29 (×9): qty 1

## 2018-03-29 MED ORDER — ACETAMINOPHEN 650 MG RE SUPP: 650.0000 mg | Freq: Four times a day (QID) | RECTAL | Status: DC | PRN

## 2018-03-29 MED ORDER — PREDNISONE 20 MG PO TABS: 40.0000 mg | ORAL_TABLET | Freq: Every day | ORAL | Status: DC

## 2018-03-29 MED ORDER — LISINOPRIL 20 MG PO TABS
20.0000 mg | ORAL_TABLET | Freq: Every day | ORAL | Status: DC
  Administered 2018-03-30 – 2018-04-03 (×5): 20 mg via ORAL
  Filled 2018-03-29 (×5): qty 1

## 2018-03-29 MED ORDER — IPRATROPIUM-ALBUTEROL 0.5-2.5 (3) MG/3ML IN SOLN
3.0000 mL | RESPIRATORY_TRACT | Status: DC
  Administered 2018-03-30 (×2): 3 mL via RESPIRATORY_TRACT
  Filled 2018-03-29 (×2): qty 3

## 2018-03-29 MED ORDER — METHYLPREDNISOLONE SODIUM SUCC 125 MG IJ SOLR
125.0000 mg | Freq: Once | INTRAMUSCULAR | Status: AC
  Administered 2018-03-29: 125 mg via INTRAVENOUS
  Filled 2018-03-29: qty 2

## 2018-03-29 NOTE — ED Notes (Signed)
Pt drops to 86% sat on RA while walking in room with this RN. 93% plus while relaxed in bed.

## 2018-03-29 NOTE — Patient Instructions (Signed)
ONLY use Coricidin HBP for your acute upper respiratory symptoms. Other over the counter products have ingredients that will cause your blood pressure to elevate.    Respectfully, Nicole Loh, MSN, APRN, FNP-C, CEN Oncology/Hematology Nurse Practitioner  [facility]

## 2018-03-29 NOTE — ED Notes (Addendum)
ED TO INPATIENT HANDOFF REPORT  ED Nurse Name and Phone #:  Metta Clines [phone]  S Name/Age/Gender Nicole Schmidt 53 y.o. female Room/Bed: ED07A/ED07A  Code Status   Code Status: Prior  Home/SNF/Other Home Patient oriented to: self, place, time and situation Is this baseline? Yes   Triage Complete: Triage complete  Chief Complaint Flu  Triage Note Pt sent from Cancer center for Flu dx. Pt afebrile in triage but has had cough since Monday. Pt denies N/V/D. BP 223/84 in triage.    Allergies No Known Allergies  Level of Care/Admitting Diagnosis ED Disposition    ED Disposition Condition [facility]: [place] [[id number]]  Level of Care: Telemetry [5]  Diagnosis: Acute hypoxemic respiratory failure [facility]) [[id number]]  Admitting Physician: Arta Silence [[id number]]  Attending Physician: Arta Silence [[id number]]  Estimated length of stay: past midnight tomorrow  Certification:: I certify this patient will need inpatient services for at least 2 midnights  PT Class (Do Not Modify): Inpatient [101]  PT Acc Code (Do Not Modify): Private [1]       B Medical/Surgery History Past Medical History:  Diagnosis Date  . Anemia   . Arthritis   . Breast cancer of upper-inner quadrant of right female breast ([place])    Right breast invasive CA and DCIS , 7 mm T1,N0,M0. Er/PR pos, her 2 negative.  Margins 1 mm.  . CHF (congestive heart failure) ([place])   . Diabetes mellitus without complication ([place])   . Gout   . Gout   . Hypertension   . Menopause    age 72  . Personal history of radiation therapy   . Pulmonary embolism ([place]) 10/2014  . Renal insufficiency   . Thyroid goiter    Past Surgical History:  Procedure Laterality Date  . BREAST BIOPSY Right 2014   breast ca  . BREAST EXCISIONAL BIOPSY Right 07/19/2012   breast ca  . BREAST SURGERY Right 2014   with sentinel node bx subareaolar duct excision  . COLONOSCOPY WITH PROPOFOL  N/A 01/30/2017   Procedure: COLONOSCOPY WITH PROPOFOL;  Surgeon: Christene Lye, MD;  Location: [facility] ENDOSCOPY;  Service: Endoscopy;  Laterality: N/A;     A IV Location/Drains/Wounds Patient Lines/Drains/Airways Status   Active Line/Drains/Airways    Name:   Placement date:   Placement time:   Site:   Days:   Peripheral IV 03/29/18 Left Antecubital   03/29/18    1746    Antecubital   less than 1   Incision (Closed) 11/20/17 Neck Right;Lateral   11/20/17    2000     129          Intake/Output Last 24 hours No intake or output data in the 24 hours ending 03/29/18 2202  Labs/Imaging Results for orders placed or performed in visit on 03/29/18 (from the past 66 hour(s))  Influenza panel by PCR (type A & B)     Status: Abnormal   Collection Time: 03/29/18  3:32 PM  Result Value Ref Range   Influenza A By PCR POSITIVE (A) NEGATIVE   Influenza B By PCR NEGATIVE NEGATIVE    Comment: (NOTE) The Xpert Xpress Flu assay is intended as an aid in the diagnosis of  influenza and should not be used as a sole basis for treatment.  This  assay is FDA approved for nasopharyngeal swab specimens only. Nasal  washings and aspirates are unacceptable for Xpert Xpress Flu testing. Performed at [facility], [phone]  [address] [place], [place] [address]    Dg Chest 2 View  Result Date: 03/29/2018 CLINICAL DATA:  Cough. EXAM: CHEST - 2 VIEW COMPARISON:  Radiographs of January 12, 2018. FINDINGS: Stable cardiomegaly with central pulmonary vascular congestion. Atherosclerosis of thoracic aorta is noted. No pneumothorax or pleural effusion is noted. No acute pulmonary disease is noted. Bony thorax is unremarkable. IMPRESSION: Stable cardiomegaly with central pulmonary vascular congestion. Aortic Atherosclerosis (ICD10-I70.0). Electronically Signed   By: Marijo Conception, M.D.   On: 03/29/2018 16:57    Pending Labs Unresulted Labs (From admission, onward)    Start     Ordered   03/30/18  0500  Calcium, ionized  Tomorrow morning,   STAT     03/29/18 2154   03/29/18 2150  Magnesium  Add-on,   AD    Question:  Specimen collection method  Answer:  Unit=Unit collect   03/29/18 2149   03/29/18 2150  Phosphorus  Add-on,   AD    Question:  Specimen collection method  Answer:  Unit=Unit collect   03/29/18 2149   Signed and Held  Basic metabolic panel  Tomorrow morning,   R     Signed and Held          Vitals/Pain Today's Vitals   03/29/18 2020 03/29/18 2030 03/29/18 2045 03/29/18 2100  BP:  (!) 168/60  (!) 177/59  Pulse:  69 66 74  Resp:      Temp:      TempSrc:      SpO2: 97% 93% 93% 93%  PainSc:        Isolation Precautions No active isolations  Medications Medications  ipratropium-albuterol (DUONEB) 0.5-2.5 (3) MG/3ML nebulizer solution 3 mL (has no administration in time range)  methylPREDNISolone sodium succinate (SOLU-MEDROL) 125 mg/2 mL injection 60 mg (has no administration in time range)    Followed by  predniSONE (DELTASONE) tablet 40 mg (has no administration in time range)  oseltamivir (TAMIFLU) capsule 75 mg (has no administration in time range)  lactated ringers infusion (has no administration in time range)  methylPREDNISolone sodium succinate (SOLU-MEDROL) 125 mg/2 mL injection 125 mg (125 mg Intravenous Given 03/29/18 1816)  ipratropium-albuterol (DUONEB) 0.5-2.5 (3) MG/3ML nebulizer solution 3 mL (3 mLs Nebulization Given 03/29/18 1816)  albuterol (PROVENTIL) (2.5 MG/3ML) 0.083% nebulizer solution 5 mg (5 mg Nebulization Given 03/29/18 1816)  sodium chloride 0.9 % bolus 1,000 mL (0 mLs Intravenous Stopped 03/29/18 2111)  cloNIDine (CATAPRES) tablet 0.2 mg (0.2 mg Oral Given 03/29/18 1908)  oseltamivir (TAMIFLU) capsule 75 mg (75 mg Oral Given 03/29/18 2111)    Mobility walks Low fall risk   Focused Assessments Pulmonary Assessment Handoff:  Lung sounds: Bilateral Breath Sounds: Expiratory wheezes O2 Device: Nasal Cannula O2 Flow Rate (L/min):  2 L/min      R Recommendations: See Admitting Provider Note  Report given to:   Additional Notes:  Pt found flu + at cancer center today. Desat to 78% while walking on RA. 97% on 2L O2. HTN.

## 2018-03-29 NOTE — ED Notes (Signed)
PT given remote, warm blanket, and food/drink per EDP [place] okay.

## 2018-03-29 NOTE — ED Notes (Addendum)
Pt desat to 78% while walking from toilet back to bed. Repositioned in bed.

## 2018-03-29 NOTE — ED Triage Notes (Addendum)
Pt sent from Cancer center for Flu dx. Pt afebrile in triage but has had cough since Monday. Pt denies N/V/D. BP 223/84 in triage.

## 2018-03-29 NOTE — ED Provider Notes (Signed)
[facility] Emergency Department Provider Note  ____________________________________________  Time seen: Approximately 9:14 PM  I have reviewed the triage vital signs and the nursing notes.   HISTORY  Chief Complaint Shortness of breath   HPI Nicole Schmidt is a 53 y.o. female with a history of breast cancer, CHF, diabetes, hypertension, pulmonary embolism on Eliquis who comes to the ED from cancer center due to diagnosis of influenza today.   Patient has had 5 days of cough, body aches, malaise, chills.  No significant chest pain.  She does feel shortness of breath which is worse with walking, better sitting still in bed.  Oxygen saturation was about 90% in cancer center so she was sent to the ED for evaluation.  Patient also reports decreased appetite and decreased oral intake and concern for dehydration.     Past Medical History:  Diagnosis Date  . Anemia   . Arthritis   . Breast cancer of upper-inner quadrant of right female breast ([place])    Right breast invasive CA and DCIS , 7 mm T1,N0,M0. Er/PR pos, her 2 negative.  Margins 1 mm.  . CHF (congestive heart failure) ([place])   . Diabetes mellitus without complication ([place])   . Gout   . Gout   . Hypertension   . Menopause    age 13  . Personal history of radiation therapy   . Pulmonary embolism ([place]) 10/2014  . Renal insufficiency   . Thyroid goiter      Patient Active Problem List   Diagnosis Date Noted  . Chronic diastolic heart failure ([place]) 02/04/2018  . Lymphedema 02/04/2018  . HTN (hypertension) 12/09/2017  . Long term (current) use of aromatase inhibitors 11/17/2017  . Elevated alkaline phosphatase level 05/20/2017  . Goals of care, counseling/discussion 05/14/2017  . Chronic renal disease, stage III ([place]) 07/08/2015  . Iron deficiency anemia 07/08/2015  . Acute renal failure superimposed on stage 3 chronic kidney disease ([place]) 10/22/2014  . History of breast cancer 10/22/2014  .  Elevated troponin 10/22/2014  . Pulmonary emboli ([place]) 10/18/2014  . Pulmonary embolism ([place]) 10/18/2014  . Candidiasis of breast 07/06/2014  . Malignant neoplasm of right female breast ([place]) 07/24/2012     Past Surgical History:  Procedure Laterality Date  . BREAST BIOPSY Right 2014   breast ca  . BREAST EXCISIONAL BIOPSY Right 07/19/2012   breast ca  . BREAST SURGERY Right 2014   with sentinel node bx subareaolar duct excision  . COLONOSCOPY WITH PROPOFOL N/A 01/30/2017   Procedure: COLONOSCOPY WITH PROPOFOL;  Surgeon: Christene Lye, MD;  Location: [facility] ENDOSCOPY;  Service: Endoscopy;  Laterality: N/A;     Prior to Admission medications   Medication Sig Start Date End Date Taking? Authorizing Provider  ALPRAZolam Duanne Moron) 0.5 MG tablet Take 0.5 mg by mouth as needed for anxiety.    Yes [provider]  Ascorbic Acid (VITAMIN C PO) Take 1 tablet by mouth 2 (two) times daily.    Yes [provider]  atorvastatin (LIPITOR) 20 MG tablet Take 20 mg by mouth at bedtime.    Yes [provider]  calcium carbonate (OSCAL) 1500 (600 Ca) MG TABS tablet Take 600 mg of elemental calcium by mouth 2 (two) times daily with a meal.   Yes [provider]  Cholecalciferol (VITAMIN D3) 50000 units TABS Take 1 tablet by mouth once a week.   Yes [provider]  CINNAMON PO Take 1,000 mg by mouth 2 (two) times  daily.    Yes [provider]  cloNIDine (CATAPRES) 0.2 MG tablet Take 0.2 mg by mouth daily.   Yes [provider]  ELIQUIS 5 MG TABS tablet TAKE 1 TABLET TWICE A DAY Patient taking differently: Take 5 mg by mouth 2 (two) times daily.  12/24/17  Yes Karen Kitchens, NP  ferrous gluconate (FERGON) 240 (27 FE) MG tablet Take 240 mg by mouth 2 (two) times daily.   Yes [provider]  folic acid (FOLVITE) 818 MCG tablet Take 800 mcg by mouth daily.    Yes [provider]  furosemide (LASIX) 20 MG tablet Take 20  mg by mouth daily.   Yes [provider]  letrozole (FEMARA) 2.5 MG tablet TAKE 1 TABLET DAILY Patient taking differently: Take 2.5 mg by mouth daily.  12/24/17  Yes Karen Kitchens, NP  levothyroxine (SYNTHROID, LEVOTHROID) 112 MCG tablet Take 112 mcg daily before breakfast by mouth.   Yes [provider]  nystatin (NYSTATIN) powder Apply topically to rash 3 times a day 03/29/18  Yes Corcoran, Melissa C, MD  sertraline (ZOLOFT) 50 MG tablet Take 50 mg by mouth daily.   Yes [provider]  Dulaglutide (TRULICITY) 1.5 HU/3.1SH SOPN Inject 1.5 mg into the skin every Monday. night    [provider]     Allergies Patient has no known allergies.   Family History  Problem Relation Age of Onset  . Diabetes Father   . Hypertension Father   . Parkinson's disease Father   . Hypertension Mother   . Rheum arthritis Mother   . Atrial fibrillation Mother   . Heart failure Mother   . Colon cancer Cousin 47  . Breast cancer Neg Hx     Social History Social History   Tobacco Use  . Smoking status: Never Smoker  . Smokeless tobacco: Never Used  Substance Use Topics  . Alcohol use: No    Alcohol/week: 0.0 standard drinks  . Drug use: No    Review of Systems  Constitutional:   No fever positive chills.  ENT:   No sore throat. No rhinorrhea. Cardiovascular:   No chest pain or syncope. Respiratory: Positive shortness of breath and cough. Gastrointestinal:   Negative for abdominal pain, vomiting and diarrhea.  Musculoskeletal:   Negative for focal pain or swelling All other systems reviewed and are negative except as documented above in ROS and HPI.  ____________________________________________   PHYSICAL EXAM:  VITAL SIGNS: ED Triage Vitals [03/29/18 1726]  Enc Vitals Group     BP (!) 223/84     Pulse Rate 60     Resp 18     Temp 98.3 F (36.8 C)     Temp Source Oral     SpO2 91 %     Weight      Height      Head Circumference      Peak  Flow      Pain Score 0     Pain Loc      Pain Edu?      Excl. in [place]?     Vital signs reviewed, nursing assessments reviewed.   Constitutional:   Alert and oriented. Non-toxic appearance. Eyes:   Conjunctivae are normal. EOMI. PERRL. ENT      Head:   Normocephalic and atraumatic.      Nose:   No congestion/rhinnorhea.       Mouth/Throat:   Dry mucous membranes, no pharyngeal erythema. No peritonsillar mass.  Neck:   No meningismus. Full ROM. Hematological/Lymphatic/Immunilogical:   No cervical lymphadenopathy. Cardiovascular:   RRR. Symmetric bilateral radial and DP pulses.  No murmurs. Cap refill less than 2 seconds. Respiratory:   Normal respiratory effort without tachypnea/retractions.  Diffuse expiratory wheezing, somewhat prolonged expiratory phase Gastrointestinal:   Soft and nontender. Non distended. There is no CVA tenderness.  No rebound, rigidity, or guarding. Musculoskeletal:   Normal range of motion in all extremities. No joint effusions.  No lower extremity tenderness.  No edema. Neurologic:   Normal speech and language.  Motor grossly intact. No acute focal neurologic deficits are appreciated.  Skin:    Skin is warm, dry and intact. No rash noted.  No petechiae, purpura, or bullae.  ____________________________________________    LABS (pertinent positives/negatives) (all labs ordered are listed, but only abnormal results are displayed) Labs Reviewed - No data to display ____________________________________________   EKG    ____________________________________________    RADIOLOGY  Dg Chest 2 View  Result Date: 03/29/2018 CLINICAL DATA:  Cough. EXAM: CHEST - 2 VIEW COMPARISON:  Radiographs of January 12, 2018. FINDINGS: Stable cardiomegaly with central pulmonary vascular congestion. Atherosclerosis of thoracic aorta is noted. No pneumothorax or pleural effusion is noted. No acute pulmonary disease is noted. Bony thorax is unremarkable. IMPRESSION:  Stable cardiomegaly with central pulmonary vascular congestion. Aortic Atherosclerosis (ICD10-I70.0). Electronically Signed   By: Marijo Conception, M.D.   On: 03/29/2018 16:57    ____________________________________________   PROCEDURES Procedures  ____________________________________________    CLINICAL IMPRESSION / ASSESSMENT AND PLAN / ED COURSE  Pertinent labs & imaging results that were available during my care of the patient were reviewed by me and considered in my medical decision making (see chart for details).      Clinical Course as of Mar 29 2112  Fri Mar 29, 2018  1758 Presents with respiratory illness due to influenza.  Oxygen saturation 90% sitting in bed, was reportedly symptomatic and short of breath when standing up at the bedside.  Evidence of bronchospasm on exam.  Give Solu-Medrol and bronchodilators and IV fluid hydration.  If persistently symptomatic or hypoxic with ambulation, may need overnight hospitalization until improved.   [PS]  2040 After steroids and bronchodilators and IV fluids for hydration, patient not significantly improved.  With ambulation, she had worsening shortness of breath and desaturation 74%.  She will need to be hospitalized for further stabilization of COPD exacerbation in the setting of her flu diagnosis.  It is unclear Tamiflu offers any benefit at this point given her 5 days of symptoms, but will order due lung disease and acute illness.   [PS]    Clinical Course User Index [PS] Carrie Mew, MD     ____________________________________________   FINAL CLINICAL IMPRESSION(S) / ED DIAGNOSES    Final diagnoses:  COPD exacerbation ([place])  Influenza A  Acute respiratory failure with hypoxia   ED Discharge Orders    None      Portions of this note were generated with dragon dictation software. Dictation errors may occur despite best attempts at proofreading.   Carrie Mew, MD 03/29/18 2117

## 2018-03-29 NOTE — Progress Notes (Signed)
Congestion and coughing x 1 week

## 2018-03-29 NOTE — ED Notes (Signed)
Pt c/o cough, SOB, sneezing. Flu + at cancer center. 95% RA. RR 24. Dyspnic at bedside upon standing. Pt stood stating "I can't breathe in bed." Repositioned in bed and pt able to breathe somewhat better.

## 2018-03-29 NOTE — ED Notes (Signed)
EDP [place] notified of inc BP.

## 2018-03-29 NOTE — Progress Notes (Signed)
[facility] day:  03/29/18     Chief Complaint: Nicole Schmidt is a 53 y.o. female with stage I breast cancer, history of submassive pulmonary embolism, and iron deficiency anemia who is seen for 3 month assessment.   HPI: The patient was last seen in the medical oncology clinic on 12/04/2017.  At that time, she was seen for assessment after a recent admission for renal insufficiency.  She was doing well overall.  She denied any acute complaints.  She was voiding normally.  She was being followed closely by outpatient nephrology.  She had an elevated blood pressure.  Several of her antihypertensives had been discontinued in the hospital due to ARF.  She denied any chest pain.  She continued to have peripheral edema and exertional shortness of breath.  She was eating well; weight was down 5 pounds.  Exam was grossly unremarkable.    She was seen in the [facility] ER on 01/12/2018 with lower extremity edema.  Notes reviewed.  Exam revealed 2+ bilateral lower extremity edema.  She received IV Lasix and restarted her oral Lasix 20 mg a day.  During the interim, patient presents with "the crud". Patient with a wet sounding productive cough (green sputum). She notes low grade fevers (Tmax 100).  Patient is fatigued and experiencing generalized myalgias. She is short of breath at rest.  Room air saturations 90-93%; worsen with exertion. Breathing worsens with minimal exertion. Patient is diaphoretic. She presents to the clinic with an elevated blood pressure of 214/67.  Patient does not verbalize any concerns with regards to her breasts today. Patient performs monthly self breast examinations as recommended. She remains on endocrine therapy (letrozole) with no perceived side effects. Patient has intertriginous candidal rash beneath her BILATERAL breasts.   Patient advises that she maintains an adequate appetite. She is eating well. Weight today is (!) 319 lb 14.4 oz  (145.1 kg), which compared to her last visit to the clinic, represents a 12 pound weight loss.    Patient denies pain in the clinic today.   Past Medical History:  Diagnosis Date  . Anemia   . Arthritis   . Breast cancer of upper-inner quadrant of right female breast ([place])    Right breast invasive CA and DCIS , 7 mm T1,N0,M0. Er/PR pos, her 2 negative.  Margins 1 mm.  . CHF (congestive heart failure) ([place])   . Diabetes mellitus without complication ([place])   . Gout   . Gout   . Hypertension   . Menopause    age 54  . Personal history of radiation therapy   . Pulmonary embolism ([place]) 10/2014  . Renal insufficiency   . Thyroid goiter     Past Surgical History:  Procedure Laterality Date  . BREAST BIOPSY Right 2014   breast ca  . BREAST EXCISIONAL BIOPSY Right 07/19/2012   breast ca  . BREAST SURGERY Right 2014   with sentinel node bx subareaolar duct excision  . COLONOSCOPY WITH PROPOFOL N/A 01/30/2017   Procedure: COLONOSCOPY WITH PROPOFOL;  Surgeon: Christene Lye, MD;  Location: [facility] ENDOSCOPY;  Service: Endoscopy;  Laterality: N/A;    Family History  Problem Relation Age of Onset  . Diabetes Father   . Hypertension Father   . Parkinson's disease Father   . Hypertension Mother   . Rheum arthritis Mother   . Atrial fibrillation Mother   . Heart failure Mother   . Colon cancer Cousin 23  .  Breast cancer Neg Hx     Social History:  reports that she has never smoked. She has never used smokeless tobacco. She reports that she does not drink alcohol or use drugs.  Her mother died of a MI.  Her father died in 06/04/15.  He had Parkinson's disease and dementia.  She is an only child.  She has never married.  The patient is alone today.  Allergies: No Known Allergies  Current Medications: Current Outpatient Medications  Medication Sig Dispense Refill  . ALPRAZolam (XANAX) 0.5 MG tablet Take 0.5 mg by mouth as needed for anxiety.     . Ascorbic Acid (VITAMIN C PO)  Take 1 tablet by mouth 2 (two) times daily.     Marland Kitchen atorvastatin (LIPITOR) 20 MG tablet Take 20 mg by mouth at bedtime.     . calcium carbonate (OSCAL) 1500 (600 Ca) MG TABS tablet Take 600 mg of elemental calcium by mouth 2 (two) times daily with a meal.    . Cholecalciferol (VITAMIN D3) 50000 units TABS Take 1 tablet by mouth once a week.    Marland Kitchen CINNAMON PO Take 1,000 mg by mouth 2 (two) times daily.     . cloNIDine (CATAPRES) 0.2 MG tablet Take 0.2 mg by mouth daily.    . Dulaglutide (TRULICITY) 1.5 HB/7.1IR SOPN Inject 1.5 mg into the skin every Monday. night    . ELIQUIS 5 MG TABS tablet TAKE 1 TABLET TWICE A DAY (Patient taking differently: Take 5 mg by mouth 2 (two) times daily. ) 180 tablet 1  . ferrous gluconate (FERGON) 240 (27 FE) MG tablet Take 240 mg by mouth 2 (two) times daily.    . folic acid (FOLVITE) 678 MCG tablet Take 800 mcg by mouth daily.     . furosemide (LASIX) 20 MG tablet Take 20 mg by mouth daily.    Marland Kitchen letrozole (FEMARA) 2.5 MG tablet TAKE 1 TABLET DAILY (Patient taking differently: Take 2.5 mg by mouth daily. ) 90 tablet 1  . levothyroxine (SYNTHROID, LEVOTHROID) 112 MCG tablet Take 112 mcg daily before breakfast by mouth.    . sertraline (ZOLOFT) 50 MG tablet Take 50 mg by mouth daily.     No current facility-administered medications for this visit.     Review of Systems  Constitutional: Positive for diaphoresis, fever (Tmax 100), malaise/fatigue and weight loss (down 12 pounds).  HENT: Positive for congestion and sinus pain. Negative for ear discharge, ear pain, nosebleeds and sore throat.   Eyes: Negative.  Negative for blurred vision, double vision and photophobia.  Respiratory: Positive for cough, sputum production (green) and shortness of breath (RA sats 90-93%; worsens with exertion). Negative for hemoptysis.   Cardiovascular: Positive for leg swelling. Negative for chest pain, palpitations, orthopnea and PND.       Hypertension.  Gastrointestinal: Negative  for abdominal pain, blood in stool, constipation, diarrhea, melena, nausea and vomiting.  Genitourinary: Negative.  Negative for dysuria, frequency, hematuria and urgency.  Musculoskeletal: Positive for joint pain. Negative for back pain, falls and myalgias.  Skin: Positive for rash (beneath breasts). Negative for itching.  Neurological: Negative for dizziness, tremors, weakness and headaches.  Endo/Heme/Allergies: Does not bruise/bleed easily.       Diabetes and HYPOthyroidism.   Psychiatric/Behavioral: Negative for depression, memory loss and suicidal ideas. The patient is not nervous/anxious and does not have insomnia.   All other systems reviewed and are negative.  Performance status (ECOG): 1 - Symptomatic but completely ambulatory  Vital Signs: BP Marland Kitchen)  214/67 (BP Location: Left Arm, Patient Position: Sitting)   Pulse 67   Temp 100 F (37.8 C) (Tympanic)   Resp 18   Ht '5\' 4"'  (1.626 m)   Wt (!) 319 lb 14.4 oz (145.1 kg)   LMP 07/17/2012   SpO2 93%   BMI 54.91 kg/m   Physical Exam  Constitutional: She is oriented to person, place, and time and well-developed, well-nourished, and in no distress. She has a sickly appearance.  Fatigued appearing.  HENT:  Head: Normocephalic and atraumatic.  Nose: Rhinorrhea present.  Mouth/Throat: Oropharynx is clear and moist and mucous membranes are normal. No oropharyngeal exudate.  Short dark hair.  Eyes: Pupils are equal, round, and reactive to light. Conjunctivae and EOM are normal. Right eye exhibits no discharge. Left eye exhibits no discharge. No scleral icterus.  Blue eyes.  Neck: Normal range of motion. Neck supple. No JVD present.  Cardiovascular: Normal rate, regular rhythm, normal heart sounds and intact distal pulses. Exam reveals no gallop and no friction rub.  No murmur heard. Pulmonary/Chest: Effort normal. No respiratory distress. She has decreased breath sounds in the right lower field. She has wheezes (right > left expiratory  wheezes). She has rhonchi in the right middle field and the right lower field. She has no rales.  Abdominal: Soft. Bowel sounds are normal. She exhibits no distension and no mass. There is no abdominal tenderness. There is no rebound and no guarding.  Musculoskeletal: Normal range of motion.        General: Edema (BLE) present. No tenderness.  Lymphadenopathy:    She has no cervical adenopathy.    She has no axillary adenopathy.       Right: No inguinal and no supraclavicular adenopathy present.       Left: No inguinal and no supraclavicular adenopathy present.  Neurological: She is alert and oriented to person, place, and time.  Skin: Skin is warm. Rash (intertriginous candidal rash, with degree of MASD, beneath BILATERAL breasts) noted. She is diaphoretic. No erythema. No pallor.  Psychiatric: Mood, affect and judgment normal.  Nursing note and vitals reviewed.   Office Visit on 03/29/2018  Component Date Value Ref Range Status  . Influenza A By PCR 03/29/2018 POSITIVE* NEGATIVE Final  . Influenza B By PCR 03/29/2018 NEGATIVE  NEGATIVE Final   Comment: (NOTE) The Xpert Xpress Flu assay is intended as an aid in the diagnosis of  influenza and should not be used as a sole basis for treatment.  This  assay is FDA approved for nasopharyngeal swab specimens only. Nasal  washings and aspirates are unacceptable for Xpert Xpress Flu testing. Performed at [facility], [address], [place], [place] [address]   Appointment on 03/29/2018  Component Date Value Ref Range Status  . Ferritin 03/29/2018 110  11 - 307 ng/mL Final   Performed at [address] [facility], [address] [place], [place] [address]  . WBC 03/29/2018 5.9  4.0 - 10.5 K/uL Final  . RBC 03/29/2018 4.71  3.87 - 5.11 MIL/uL Final  . Hemoglobin 03/29/2018 12.1  12.0 - 15.0 g/dL Final  . HCT 03/29/2018 41.4  36.0 - 46.0 % Final  . MCV 03/29/2018 87.9  80.0 - 100.0 fL Final  . MCH 03/29/2018 25.7* 26.0 - 34.0 pg  Final  . MCHC 03/29/2018 29.2* 30.0 - 36.0 g/dL Final  . RDW 03/29/2018 14.9  11.5 - 15.5 % Final  . Platelets 03/29/2018 195  150 - 400 K/uL Final  .  nRBC 03/29/2018 0.0  0.0 - 0.2 % Final  . Neutrophils Relative % 03/29/2018 53  % Final  . Neutro Abs 03/29/2018 3.1  1.7 - 7.7 K/uL Final  . Lymphocytes Relative 03/29/2018 31  % Final  . Lymphs Abs 03/29/2018 1.8  0.7 - 4.0 K/uL Final  . Monocytes Relative 03/29/2018 12  % Final  . Monocytes Absolute 03/29/2018 0.7  0.1 - 1.0 K/uL Final  . Eosinophils Relative 03/29/2018 4  % Final  . Eosinophils Absolute 03/29/2018 0.2  0.0 - 0.5 K/uL Final  . Basophils Relative 03/29/2018 0  % Final  . Basophils Absolute 03/29/2018 0.0  0.0 - 0.1 K/uL Final  . Immature Granulocytes 03/29/2018 0  % Final  . Abs Immature Granulocytes 03/29/2018 0.01  0.00 - 0.07 K/uL Final   Performed at [facility], [address], [place], [place] [address]  . Sodium 03/29/2018 144  135 - 145 mmol/L Final  . Potassium 03/29/2018 4.1  3.5 - 5.1 mmol/L Final  . Chloride 03/29/2018 108  98 - 111 mmol/L Final  . CO2 03/29/2018 29  22 - 32 mmol/L Final  . Glucose, Bld 03/29/2018 98  70 - 99 mg/dL Final  . BUN 03/29/2018 29* 6 - 20 mg/dL Final  . Creatinine, Ser 03/29/2018 1.40* 0.44 - 1.00 mg/dL Final  . Calcium 03/29/2018 8.7* 8.9 - 10.3 mg/dL Final  . Total Protein 03/29/2018 7.0  6.5 - 8.1 g/dL Final  . Albumin 03/29/2018 3.5  3.5 - 5.0 g/dL Final  . AST 03/29/2018 28  15 - 41 U/L Final  . ALT 03/29/2018 22  0 - 44 U/L Final  . Alkaline Phosphatase 03/29/2018 109  38 - 126 U/L Final  . Total Bilirubin 03/29/2018 0.4  0.3 - 1.2 mg/dL Final  . GFR calc non Af Amer 03/29/2018 42* >60 mL/min Final  . GFR calc Af Amer 03/29/2018 49* >60 mL/min Final  . Anion gap 03/29/2018 7  5 - 15 Final   Performed at [facility], [address] [place], [place] [address]    Assessment:  ADELYNA BROCKMAN is a 53 y.o. female with a history of stage I right breast  cancer status post wide local incision on 08/12/2012.  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  Sentinel lymph node was negative.  Tumor was ER/PR positive and Her2/neu negative.  Oncotype DX score was 14 (low risk).  She completed breast radiation in 10/2012.  She began tamoxifen in 10/2012, but discontinued it on 10/19/2014 following a submassive PE.  She began Femara on 06/10/2015.   Bilateral diagnostic mammogram on 10/10/2016 revealed no mammographic evidence for malignancy.  CA27.29 has been followed: 21.7 on 04/24/2013, 21.4 on 07/31/2013, 17.9 on 03/09/2014, 20.7 on 01/18/2015, 17.2 on 06/10/2015, 22.5 on 08/13/2015, 16.6 on 11/08/2015, 19.8 on 01/31/2016, 25.2 on 05/29/2016, 19.3 on 11/30/2016, and 22.1 on 05/14/2017.  She had a bilateral submassive pulmonary emboli on 10/18/2014.  Chest CT angiogram revealed submassive PE with right heart strain.  She received catheter directed lytic therapy at [facility].  Lower extremity duplex was negative for DVT.  She was on Xarelto (discontinued secondary to oral bleeding).  She began Eliquis on 07/08/2015.  Hypercoagulable work-up revealed the following normal studies:  Factor V Leiden, prothrombin gene mutation, lupus anticoagulant panel, anticardiolipin antibodies, protein S total (127%), protein S activity (99%), protein C total (71%), protein C activity (77%), and ATIII activity (82%).  Bone density study on 01/26/2015 was  normal with a T score of -0.3.  She has had no menses since 07/2012.  Labs on 01/18/2015 confirmed a post-menopausal status.  She began Femara on 06/10/2015.   BCI testing indicate a 3% (95% CI: 0.8-5.0%) risk of late recurrence, thus a low likelihood of benefit for extended (5 versus 10 years) adjuvant hormonal therapy.   She was noted to have a normocytic anemia on 06/10/2015.  Labs on 07/08/2015 revealed documented iron deficiency.  Ferritin was 25, iron saturation 6%, TIBC 473 (high).  Normal labs  included B12, folate, SPEP revealed, LFTs, and guaiac cards x 3.   She is on oral iron with vitamin C.  Ferritin has been followed:  25 on 07/08/2015, 30 on 11/08/2015, 46 on 01/31/2016, 37 on 05/29/2016,  53 on 05/14/2017, and 92 on 11/14/2017.   She was noted to have stage III renal insufficiency on 06/10/2015.  Creatinine was 1.34 (CrCl 45 ml/min) with prior baseline Cr of 0.86 - 0.93. She was seen by nephrology.  Etiology was felt secondary to dehydration.  Echocardiogram on 11/20/2017 demonstrated an LVEF of 55-60%.  Bone density on 11/29/2017 that revealed a normal T-score of -1.0 in the LEFT femoral neck.   She was admitted to [facility] from 11/15/2017 - 11/21/2017 for ARF (creatinine 4.54) secondary to volume depletion, as patient was on both a loop and thiazide diuretic. Patient underwent gentle IV rehydration. Despite efforts, renal function continued to deteriorate; BUN 62 and creatine 6.3. Potassium was elevated to 5.4 mmol/L. She underwent dialysis on 11/28/2017.  Pre-discharge labs revealed a BUN of 46 and creatinine of 2.62. Potassium down to 5.0 mmol/L.  Symptomatically, she presents with flu like symptoms that have been worsening over the last week. Patient with low grade fevers (Tmax 100) and a productive (green sputum) cough. She has generalized myalgias. Blood pressure elevated in clinic (SBP > 200). Patient using OTC cold medications for symptomatic relief. Exam reveals an acutely ill appearing female who is SOB at rest. Room air oxygen saturations 90-93%; worsening with exertion.    Plan: 1. Labs today:  CBC with diff, CMP, CA27.29, ferritin, rapid flu 2. Acute respiratory (febrile) illness  (+) flu-like symptoms x 1 week  (+) low grade fevers , productive cough, and myalgias  Nasal swab for influenza today in clinic.   Concern for RML/RLL pneumonia.   Obtain 2 view CXR to further assess. 3. Stage I RIGHT breast cancer  Doing well with regards to her breast cancer.  No  breast complaints.  Continues on endocrine therapy.    BCI testing indicate a 3% risk of late recurrence between years 5 and 10.  Low likelihood of benefit for extended (5 versus 10 years) adjuvant hormonal therapy.  She was on tamoxifen for 24 months before discontinuing due to a submassive PE.  She was OFF therapy for total 7 months before restarting using an AI.  Now on letrozole.  Anticipate completion in 05/2018.  Routine surveillance schedule reviewed with patient.  At this point, patient will be seen in the medical oncology clinic every 6 months for years 4-5, and then annually thereafter. 4. Renal insufficiency  Labs reviewed.  BUN 29 and creatinine 1.40 mg/dL. Estimated Creatinine Clearance: 65.9 mL/min (A) (by C-G formula based on SCr of 1.4 mg/dL (H)).   Reviewed previous admission that resulted in temporary hemodialysis.    Back on daily loop diuretic therapy (furosemide 20 mg/day)  Followed by nephrology as an outpatient.   Patient is not drinking well due to  acute illness.  Concern for insensible water loss.  Would benefit from IV rehydration.  5. Elevated alkaline phosphatase  Labs reviewed. LFTs normal.  6. Hypertension  Blood pressure elevated in clinic today at 214/67.  Has been taking OTC cold medications, which is likely contributory.   Taking prescribed antihypertensives; denies missed doses.   With past need for hemodialysis, would benefit from further evaluation.  7. Iron deficiency anemia  Hemoglobin 12.1, hematocrit 41.4, MCV 87.9, and platelets 195,000.  Ferritin normal at 110 ng/mL.  Continues on F[place] 240 mg, taken with a source of vitamin C, twice daily. 8. History of BILATERAL pulmonary emboli  Managed on chronic anticoagulation therapy (apixaban).  Denies increased bruising or bleeding.    Once again requesting to discontinue medication at this point.  Reviewed that she previously developed a VTE while on endocrine therapy.  We  will need to remain on chronic anticoagulation (apixaban) while on treatment for her breast cancer.  Discussed potential discontinuation in 05/2018 when she completes her course of endocrine therapy.  9. Recurrent fungal rash  Acute recurrence of intertriginous candidal rash noted beneath BILATERAL breasts.  (+) MASD; erythematous and peeling.   Educated patient on need to keep areas as clean and DRY as possible.   Rx: Nystatin TID to rash x 1 week then PRN 10. RTC as already scheduled in 05/2018 for MD assessment and labs (CBC with differential, CMP, ferritin, CA27.29), and discussions regarding discontinuation of letrozole and apixaban.   ADDENDUM: Nasal swab (+) for influenza A by PCR. CXR, which was personally reviewed, demonstrated no evidence of a concurrent pneumonia. Given patient's symptoms (renal insufficiency, HTN, and relatively low oxygen saturations), will send to ED from clinic for further evaluation, rehydration, and possible admission. Report called to ED charge nurse Levada Dy, RN) and patient taken via wheelchair by [facility] staff.    Honor Loh, NP  03/29/2018, 5:35 PM   I saw and evaluated the patient, participating in the key portions of the service and reviewing pertinent diagnostic studies and records.  I reviewed the nurse practitioner's note and agree with the findings and the plan.  The assessment and plan were discussed with the patient.  Additional diagnostic studies of CXR are needed to clarify respiratory symptoms and would change the clinical management.  Multiple questions were asked by the patient and answered.   Nolon Stalls, MD 03/29/2018, 5:35 PM

## 2018-03-30 LAB — CANCER ANTIGEN 27.29: CA 27.29: 27.5 U/mL (ref 0.0–38.6)

## 2018-03-30 MED ORDER — IPRATROPIUM-ALBUTEROL 0.5-2.5 (3) MG/3ML IN SOLN
3.0000 mL | Freq: Four times a day (QID) | RESPIRATORY_TRACT | Status: DC
  Administered 2018-03-30 – 2018-03-31 (×3): 3 mL via RESPIRATORY_TRACT
  Filled 2018-03-30 (×3): qty 3

## 2018-03-30 MED ORDER — PREDNISONE 20 MG PO TABS
30.0000 mg | ORAL_TABLET | Freq: Every day | ORAL | Status: DC
  Administered 2018-03-31 – 2018-04-02 (×3): 30 mg via ORAL
  Filled 2018-03-30 (×2): qty 1

## 2018-03-30 MED ORDER — NYSTATIN 100000 UNIT/ML MT SUSP
5.0000 mL | Freq: Four times a day (QID) | OROMUCOSAL | Status: DC
  Administered 2018-03-30 – 2018-04-01 (×9): 500000 [IU] via ORAL
  Filled 2018-03-30 (×8): qty 5

## 2018-03-30 MED ORDER — AMLODIPINE BESYLATE 5 MG PO TABS
5.0000 mg | ORAL_TABLET | Freq: Every day | ORAL | Status: DC
  Administered 2018-03-30 – 2018-04-01 (×3): 5 mg via ORAL
  Filled 2018-03-30 (×3): qty 1

## 2018-03-30 MED ORDER — HYDRALAZINE HCL 20 MG/ML IJ SOLN
5.0000 mg | Freq: Four times a day (QID) | INTRAMUSCULAR | Status: DC | PRN
  Administered 2018-03-30: 5 mg via INTRAVENOUS
  Filled 2018-03-30: qty 1

## 2018-03-30 NOTE — Progress Notes (Signed)
Patient ID: Nicole Schmidt, female   DOB: 1966-01-10, 53 y.o.   MRN: [id number]  [facility] PROGRESS NOTE  Nicole Schmidt WGN:[id number] DOB: 1965-12-19 DOA: 03/29/2018 PCP: Lenard Simmer, MD  HPI/Subjective: Patient states that she did not really feel sick.  Nicole Schmidt has a follow-up appointment for Dr. Mike Gip as a breast cancer follow-up.  She was diagnosed with the flu and hypoxia and she was sent in for further evaluation.  She has a slight cough.  Does feel little short of breath.  Some runny nose.  Objective: Vitals:   03/30/18 1410 03/30/18 1411  BP:  (!) 210/80  Pulse:  63  Resp:    Temp:    SpO2: 95%     Filed Weights   03/29/18 2339  Weight: (!) 143.9 kg    ROS: Review of Systems  Constitutional: Negative for chills and fever.  Eyes: Negative for blurred vision.  Respiratory: Positive for cough and shortness of breath.   Cardiovascular: Negative for chest pain.  Gastrointestinal: Negative for abdominal pain, constipation, diarrhea, nausea and vomiting.  Genitourinary: Negative for dysuria.  Musculoskeletal: Negative for joint pain.  Neurological: Negative for dizziness and headaches.   Exam: Physical Exam  Constitutional: She is oriented to person, place, and time.  HENT:  Nose: No mucosal edema.  Mouth/Throat: No oropharyngeal exudate or posterior oropharyngeal edema.  Thrush in the mouth  Eyes: Pupils are equal, round, and reactive to light. Conjunctivae, EOM and lids are normal.  Neck: No JVD present. Carotid bruit is not present. No edema present. No thyroid mass and no thyromegaly present.  Cardiovascular: S1 normal and S2 normal. Exam reveals no gallop.  No murmur heard. Pulses:      Dorsalis pedis pulses are 2+ on the right side and 2+ on the left side.  Respiratory: No respiratory distress. She has decreased breath sounds in the right lower field and the left lower field. She has no wheezes. She has no rhonchi. She has no rales.  GI: Soft. Bowel  sounds are normal. There is no abdominal tenderness.  Musculoskeletal:     Right ankle: She exhibits swelling.     Left ankle: She exhibits swelling.  Lymphadenopathy:    She has no cervical adenopathy.  Neurological: She is alert and oriented to person, place, and time. No cranial nerve deficit.  Skin: Skin is warm. No rash noted. Nails show no clubbing.  Psychiatric: She has a normal mood and affect.      Data Reviewed: Basic Metabolic Panel: Recent Labs  Lab 03/29/18 1448 03/29/18 1751  NA 144  --   K 4.1  --   CL 108  --   CO2 29  --   GLUCOSE 98  --   BUN 29*  --   CREATININE 1.40*  --   CALCIUM 8.7*  --   MG  --  1.9  PHOS  --  3.8   Liver Function Tests: Recent Labs  Lab 03/29/18 1448  AST 28  ALT 22  ALKPHOS 109  BILITOT 0.4  PROT 7.0  ALBUMIN 3.5   CBC: Recent Labs  Lab 03/29/18 1448  WBC 5.9  NEUTROABS 3.1  HGB 12.1  HCT 41.4  MCV 87.9  PLT 195   BNP (last 3 results) Recent Labs    01/12/18 1425  BNP 185.0*     CBG: Recent Labs  Lab 03/29/18 2345  GLUCAP 152*      Studies: Dg Chest 2 View  Result Date:  03/29/2018 CLINICAL DATA:  Cough. EXAM: CHEST - 2 VIEW COMPARISON:  Radiographs of January 12, 2018. FINDINGS: Stable cardiomegaly with central pulmonary vascular congestion. Atherosclerosis of thoracic aorta is noted. No pneumothorax or pleural effusion is noted. No acute pulmonary disease is noted. Bony thorax is unremarkable. IMPRESSION: Stable cardiomegaly with central pulmonary vascular congestion. Aortic Atherosclerosis (ICD10-I70.0). Electronically Signed   By: Marijo Conception, M.D.   On: 03/29/2018 16:57    Scheduled Meds: . amLODipine  5 mg Oral Daily  . apixaban  5 mg Oral BID  . atorvastatin  20 mg Oral QHS  . cloNIDine  0.2 mg Oral TID  . ferrous gluconate  324 mg Oral BID  . folic acid  1 mg Oral Daily  . furosemide  20 mg Oral Daily  . insulin aspart  0-5 Units Subcutaneous QHS  . insulin aspart  0-9 Units  Subcutaneous TID WC  . ipratropium-albuterol  3 mL Nebulization Q6H  . letrozole  2.5 mg Oral Daily  . levothyroxine  112 mcg Oral QAC breakfast  . lisinopril  20 mg Oral Daily  . methylPREDNISolone (SOLU-MEDROL) injection  60 mg Intravenous Q12H   Followed by  . [START ON 03/31/2018] predniSONE  40 mg Oral Q breakfast  . nystatin  5 mL Oral QID  . oseltamivir  75 mg Oral BID  . sertraline  50 mg Oral Daily   Continuous Infusions:  Assessment/Plan:  1. Acute hypoxic respiratory failure secondary to the flu.  When we ambulated her today her pulse ox still dropped into the 80s.  Continue oxygen supplementation today. 2. Influenza A positive.  Continue Tamiflu. 3. Asthmatic bronchitis.  Taper steroids with hypertension.  Continue nebulizer treatments. 4. Accelerated hypertension.  Added Norvasc.  PRN hydralazine IV.  Patient on clonidine and lisinopril. 5. Morbid obesity with a BMI of 54.46.  Weight loss needed 6. History of breast cancer on letrozole 7. Hypothyroidism unspecified on levothyroxine 8. History of pulmonary embolism in the past on Eliquis  Code Status:     Code Status Orders  (From admission, onward)         Start     Ordered   03/29/18 2320  Full code  Continuous     03/29/18 2319        Code Status History    Date Active Date Inactive Code Status Order ID Comments User Context   11/15/2017 2055 11/21/2017 1459 Full Code [id number]  Gorden Harms, MD Inpatient   10/18/2014 2221 10/23/2014 2043 Full Code [id number]  Jacqulynn Cadet, MD Inpatient   10/18/2014 2119 10/18/2014 2221 Full Code [id number]  Guy Begin, MD Inpatient    Advance Directive Documentation     Most Recent Value  Type of Advance Directive  Healthcare Power of [place], Living will  Pre-existing out of facility DNR order (yellow form or pink MOST form)  -  "MOST" Form in Place?  -      Disposition Plan: We will need to be able to come off the oxygen prior to  disposition  Antibiotics:  Tamiflu  Time spent: 28 minutes  [place]

## 2018-03-30 NOTE — Progress Notes (Signed)
Spoke with dr. Leslye Peer regarding patient blood pressure 182/69 post scheduled blood pressure medication and sat on ra with exertion. Per md will place orders as needed. Will continue to monitor

## 2018-03-30 NOTE — H&P (Signed)
[facility] [place] NAME: Nicole Schmidt    MR#:  [id number]  DATE OF BIRTH:  May 18, 1965  DATE OF ADMISSION:  03/29/2018  PRIMARY CARE PHYSICIAN: Lenard Simmer, MD   REQUESTING/REFERRING PHYSICIAN: Carrie Mew, MD  CHIEF COMPLAINT:  No chief complaint on file.   HISTORY OF PRESENT ILLNESS:  Nicole Schmidt  is a 53 y.o. female with a known history of R Br Ca (Stage I), PE (Eliquis), T2NIDDM p/w SOB. URI Sx x~1wk, cough occasional productive of green sputum initially (now dry x1-2d), congestion, rhinorrhea. Took OTC meds, no improvement. [facility]14, Flu (+), sent to ED. CXR (-). Denies smoking or Hx/prior Dx of COPD and/or asthma, but states mother was a non-smoker and had COPD. Had chest radiation 2/2 Br Ca. Wheezing. Comfortable on [place] O2. BP high, on Clonidine qD at home (as opposed to BID/TID).  PAST MEDICAL HISTORY:   Past Medical History:  Diagnosis Date  . Anemia   . Arthritis   . Breast cancer of upper-inner quadrant of right female breast ([place])    Right breast invasive CA and DCIS , 7 mm T1,N0,M0. Er/PR pos, her 2 negative.  Margins 1 mm.  . CHF (congestive heart failure) ([place])   . Diabetes mellitus without complication ([place])   . Gout   . Gout   . Hypertension   . Menopause    age 89  . Personal history of radiation therapy   . Pulmonary embolism ([place]) 10/2014  . Renal insufficiency   . Thyroid goiter     PAST SURGICAL HISTORY:   Past Surgical History:  Procedure Laterality Date  . BREAST BIOPSY Right 2014   breast ca  . BREAST EXCISIONAL BIOPSY Right 07/19/2012   breast ca  . BREAST SURGERY Right 2014   with sentinel node bx subareaolar duct excision  . COLONOSCOPY WITH PROPOFOL N/A 01/30/2017   Procedure: COLONOSCOPY WITH PROPOFOL;  Surgeon: Christene Lye, MD;  Location: [facility] ENDOSCOPY;  Service: Endoscopy;  Laterality: N/A;    SOCIAL HISTORY:   Social History   Tobacco  Use  . Smoking status: Never Smoker  . Smokeless tobacco: Never Used  Substance Use Topics  . Alcohol use: No    Alcohol/week: 0.0 standard drinks    FAMILY HISTORY:   Family History  Problem Relation Age of Onset  . Diabetes Father   . Hypertension Father   . Parkinson's disease Father   . Hypertension Mother   . Rheum arthritis Mother   . Atrial fibrillation Mother   . Heart failure Mother   . Colon cancer Cousin 49  . Breast cancer Neg Hx     DRUG ALLERGIES:  No Known Allergies  REVIEW OF SYSTEMS:   Review of Systems  Constitutional: Positive for malaise/fatigue. Negative for chills, diaphoresis, fever and weight loss.  HENT: Positive for congestion and sinus pain. Negative for ear pain, hearing loss, nosebleeds, sore throat and tinnitus.   Eyes: Negative for blurred vision, double vision and photophobia.  Respiratory: Positive for cough, sputum production, shortness of breath and wheezing.   Cardiovascular: Negative for chest pain, palpitations, orthopnea, claudication, leg swelling and PND.  Gastrointestinal: Negative for abdominal pain, blood in stool, constipation, diarrhea, heartburn, melena, nausea and vomiting.  Genitourinary: Negative for dysuria, frequency, hematuria and urgency.  Musculoskeletal: Negative for back pain, joint pain, myalgias and neck pain.  Skin: Negative for itching and rash.  Neurological: Positive for  weakness. Negative for dizziness, tingling, tremors, sensory change, speech change, focal weakness, seizures, loss of consciousness and headaches.  Psychiatric/Behavioral: Negative for depression and memory loss. The patient is not nervous/anxious and does not have insomnia.    MEDICATIONS AT HOME:   Prior to Admission medications   Medication Sig Start Date End Date Taking? Authorizing Provider  ALPRAZolam Duanne Moron) 0.5 MG tablet Take 0.5 mg by mouth as needed for anxiety.    Yes [provider]  Ascorbic Acid (VITAMIN C PO) Take 1  tablet by mouth 2 (two) times daily.    Yes [provider]  atorvastatin (LIPITOR) 20 MG tablet Take 20 mg by mouth at bedtime.    Yes [provider]  calcium carbonate (OSCAL) 1500 (600 Ca) MG TABS tablet Take 600 mg of elemental calcium by mouth 2 (two) times daily with a meal.   Yes [provider]  Cholecalciferol (VITAMIN D3) 50000 units TABS Take 1 tablet by mouth once a week.   Yes [provider]  CINNAMON PO Take 1,000 mg by mouth 2 (two) times daily.    Yes [provider]  cloNIDine (CATAPRES) 0.2 MG tablet Take 0.2 mg by mouth daily.   Yes [provider]  ELIQUIS 5 MG TABS tablet TAKE 1 TABLET TWICE A DAY Patient taking differently: Take 5 mg by mouth 2 (two) times daily.  12/24/17  Yes Karen Kitchens, NP  ferrous gluconate (FERGON) 240 (27 FE) MG tablet Take 240 mg by mouth 2 (two) times daily.   Yes [provider]  folic acid (FOLVITE) 235 MCG tablet Take 800 mcg by mouth daily.    Yes [provider]  furosemide (LASIX) 20 MG tablet Take 20 mg by mouth daily.   Yes [provider]  letrozole (FEMARA) 2.5 MG tablet TAKE 1 TABLET DAILY Patient taking differently: Take 2.5 mg by mouth daily.  12/24/17  Yes Karen Kitchens, NP  levothyroxine (SYNTHROID, LEVOTHROID) 112 MCG tablet Take 112 mcg daily before breakfast by mouth.   Yes [provider]  nystatin (NYSTATIN) powder Apply topically to rash 3 times a day 03/29/18  Yes Corcoran, Melissa C, MD  sertraline (ZOLOFT) 50 MG tablet Take 50 mg by mouth daily.   Yes [provider]  Dulaglutide (TRULICITY) 1.5 TD/3.2KG SOPN Inject 1.5 mg into the skin every Monday. night    [provider]      VITAL SIGNS:  Blood pressure (!) 179/64, pulse 61, temperature 98.5 F (36.9 C), temperature source Oral, resp. rate 20, height 5\' 4"  (1.626 m), weight (!) 143.9 kg, last menstrual period 07/17/2012, SpO2 95 %.  PHYSICAL EXAMINATION:    Physical Exam Constitutional:      General: She is not in acute distress.    Appearance: She is ill-appearing. She is not toxic-appearing or diaphoretic.     Interventions: She is not intubated. HENT:     Head: Atraumatic.     Mouth/Throat:     Pharynx: Oropharynx is clear.  Eyes:     General: No scleral icterus.    Extraocular Movements: Extraocular movements intact.     Conjunctiva/sclera: Conjunctivae normal.  Neck:     Musculoskeletal: Neck supple.  Cardiovascular:     Rate and Rhythm: Normal rate and regular rhythm.     Heart sounds: Normal heart sounds. No murmur. No friction rub. No gallop.   Pulmonary:     Effort: No tachypnea, bradypnea, accessory muscle usage, respiratory distress or retractions. She  is not intubated.     Breath sounds: No stridor or transmitted upper airway sounds. Examination of the right-upper field reveals decreased breath sounds and wheezing. Examination of the left-upper field reveals decreased breath sounds and wheezing. Examination of the right-middle field reveals decreased breath sounds and wheezing. Examination of the left-middle field reveals decreased breath sounds and wheezing. Examination of the right-lower field reveals decreased breath sounds and wheezing. Examination of the left-lower field reveals decreased breath sounds and wheezing. Decreased breath sounds and wheezing present. No rhonchi or rales.  Abdominal:     General: Bowel sounds are normal. There is no distension.     Palpations: Abdomen is soft.     Tenderness: There is no abdominal tenderness. There is no guarding or rebound.  Musculoskeletal: Normal range of motion.        General: No swelling or tenderness.     Right lower leg: No edema.     Left lower leg: No edema.  Lymphadenopathy:     Cervical: No cervical adenopathy.  Skin:    General: Skin is warm and dry.     Findings: No erythema or rash.  Neurological:     Mental Status: She is alert and oriented to person,  place, and time. Mental status is at baseline.  Psychiatric:        Mood and Affect: Mood normal.        Behavior: Behavior normal.        Thought Content: Thought content normal.        Judgment: Judgment normal.    LABORATORY PANEL:   CBC Recent Labs  Lab 03/29/18 1448  WBC 5.9  HGB 12.1  HCT 41.4  PLT 195   ------------------------------------------------------------------------------------------------------------------  Chemistries  Recent Labs  Lab 03/29/18 1448 03/29/18 1751  NA 144  --   K 4.1  --   CL 108  --   CO2 29  --   GLUCOSE 98  --   BUN 29*  --   CREATININE 1.40*  --   CALCIUM 8.7*  --   MG  --  1.9  AST 28  --   ALT 22  --   ALKPHOS 109  --   BILITOT 0.4  --    ------------------------------------------------------------------------------------------------------------------  Cardiac Enzymes No results for input(s): TROPONINI in the last 168 hours. ------------------------------------------------------------------------------------------------------------------  RADIOLOGY:  Dg Chest 2 View  Result Date: 03/29/2018 CLINICAL DATA:  Cough. EXAM: CHEST - 2 VIEW COMPARISON:  Radiographs of January 12, 2018. FINDINGS: Stable cardiomegaly with central pulmonary vascular congestion. Atherosclerosis of thoracic aorta is noted. No pneumothorax or pleural effusion is noted. No acute pulmonary disease is noted. Bony thorax is unremarkable. IMPRESSION: Stable cardiomegaly with central pulmonary vascular congestion. Aortic Atherosclerosis (ICD10-I70.0). Electronically Signed   By: Marijo Conception, M.D.   On: 03/29/2018 16:57   IMPRESSION AND PLAN:   A/P: 33F SOB, Flu A (+), wheezing, no Hx/prior Dx of COPD and/or asthma. Unctrl HTN. T2NIDDM. Cr elevation/CKD III, hypocalcemia. -SOB, Flu A (+), wheezing: No Hx/prior Dx of COPD and/or asthma. Possible bronchitis/reactive airways 2/2 Flu. CXR (-). SIRS (-). Nebs, steroids, Tamiflu, incentive spirometry, pulmonary  toileting. -Unctrl HTN: Clonidine changed from qD to TID. Lisinopril added. Monitor BMP. -Cr elevation, CKD III: Cr 1.40, at baseline. Avoid nephrotoxins. -Hypocalcemia: Ionized calcium. -c/w other home meds/formulary subs as tolerated. -FEN/GI: Cardiac diabetic diet. -DVT PPx: Eliquis. -Code status: Full code. -Disposition: Admission, > 2 midnights.   All the records are reviewed and case  discussed with ED provider. Management plans discussed with the patient, family and they are in agreement.  CODE STATUS: Full code.  TOTAL TIME TAKING CARE OF THIS PATIENT: 75 minutes.    Arta Silence M.D on 03/30/2018 at 1:37 AM  Between 7am to 6pm - Pager - [phone]  After 6pm go to [email or web address] - Proofreader  [facility]  Office  [phone]  CC: Primary care physician; Lenard Simmer, MD   Note: This dictation was prepared with Dragon dictation along with smaller phrase technology. Any transcriptional errors that result from this process are unintentional.

## 2018-03-31 LAB — BASIC METABOLIC PANEL WITH GFR
Anion gap: 9 (ref 5–15)
BUN: 35 mg/dL — ABNORMAL HIGH (ref 6–20)
CO2: 25 mmol/L (ref 22–32)
Calcium: 8 mg/dL — ABNORMAL LOW (ref 8.9–10.3)
Chloride: 106 mmol/L (ref 98–111)
Creatinine, Ser: 1.43 mg/dL — ABNORMAL HIGH (ref 0.44–1.00)
GFR calc Af Amer: 48 mL/min — ABNORMAL LOW
GFR calc non Af Amer: 41 mL/min — ABNORMAL LOW
Glucose, Bld: 140 mg/dL — ABNORMAL HIGH (ref 70–99)
Potassium: 4.2 mmol/L (ref 3.5–5.1)
Sodium: 140 mmol/L (ref 135–145)

## 2018-03-31 MED ORDER — IPRATROPIUM-ALBUTEROL 0.5-2.5 (3) MG/3ML IN SOLN: 3.0000 mL | Freq: Two times a day (BID) | RESPIRATORY_TRACT | Status: DC

## 2018-03-31 MED ORDER — FUROSEMIDE 20 MG PO TABS
20.0000 mg | ORAL_TABLET | Freq: Two times a day (BID) | ORAL | Status: DC
  Administered 2018-03-31 – 2018-04-01 (×2): 20 mg via ORAL
  Filled 2018-03-31 (×2): qty 1

## 2018-03-31 MED ORDER — CLONIDINE HCL 0.2 MG PO TABS: 0.2000 mg | ORAL_TABLET | Freq: Two times a day (BID) | ORAL | 0 refills | Status: DC

## 2018-03-31 MED ORDER — AMLODIPINE BESYLATE 5 MG PO TABS: 5.0000 mg | ORAL_TABLET | Freq: Every day | ORAL | 0 refills | Status: DC

## 2018-03-31 MED ORDER — OSELTAMIVIR PHOSPHATE 75 MG PO CAPS: 75.0000 mg | ORAL_CAPSULE | Freq: Two times a day (BID) | ORAL | 0 refills | Status: DC

## 2018-03-31 MED ORDER — LISINOPRIL 20 MG PO TABS: 20.0000 mg | ORAL_TABLET | Freq: Every day | ORAL | 0 refills | Status: DC

## 2018-03-31 MED ORDER — IPRATROPIUM-ALBUTEROL 0.5-2.5 (3) MG/3ML IN SOLN
3.0000 mL | Freq: Two times a day (BID) | RESPIRATORY_TRACT | Status: DC
  Administered 2018-03-31 – 2018-04-03 (×7): 3 mL via RESPIRATORY_TRACT
  Filled 2018-03-31 (×7): qty 3

## 2018-03-31 MED ORDER — NYSTATIN 100000 UNIT/ML MT SUSP: 5.0000 mL | Freq: Four times a day (QID) | OROMUCOSAL | 0 refills | Status: DC

## 2018-03-31 MED ORDER — CLONIDINE HCL 0.1 MG PO TABS
0.2000 mg | ORAL_TABLET | Freq: Two times a day (BID) | ORAL | Status: DC
  Administered 2018-03-31 – 2018-04-03 (×7): 0.2 mg via ORAL
  Filled 2018-03-31 (×8): qty 2

## 2018-03-31 NOTE — Progress Notes (Signed)
SATURATION QUALIFICATIONS: (This note is used to comply with regulatory documentation for home oxygen)  Patient Saturations on Room Air at Rest = 94%  Patient Saturations on Room Air while Ambulating = 84%  Patient Saturations on 2 Liters of oxygen while Ambulating = 95%  Please briefly explain why patient needs home oxygen: 

## 2018-03-31 NOTE — Progress Notes (Signed)
Patient ID: JAMARIAH TONY, female   DOB: Jan 19, 1966, 53 y.o.   MRN: [id number]  [facility] PROGRESS NOTE  CORRIE REDER BDZ:[id number] DOB: [birth year] DOA: 03/29/2018 PCP: Lenard Simmer, MD  HPI/Subjective: Patient feeling better.  No wheezing.  States that she is able to take a deeper breath.  Was able to come off oxygen when she is not moving around.  Objective: Vitals:   03/31/18 0419 03/31/18 0925  BP: (!) 173/76 (!) 154/67  Pulse: (!) 54 (!) 57  Resp: 16   Temp: 97.9 F (36.6 C) 98.4 F (36.9 C)  SpO2: 94% 96%    Filed Weights   03/29/18 2339  Weight: (!) 143.9 kg    ROS: Review of Systems  Constitutional: Negative for chills and fever.  Eyes: Negative for blurred vision.  Respiratory: Positive for cough and shortness of breath.   Cardiovascular: Negative for chest pain.  Gastrointestinal: Negative for abdominal pain, constipation, diarrhea, nausea and vomiting.  Genitourinary: Negative for dysuria.  Musculoskeletal: Negative for joint pain.  Neurological: Negative for dizziness and headaches.   Exam: Physical Exam  Constitutional: She is oriented to person, place, and time.  HENT:  Nose: No mucosal edema.  Mouth/Throat: No oropharyngeal exudate or posterior oropharyngeal edema.  Thrush in the mouth  Eyes: Pupils are equal, round, and reactive to light. Conjunctivae, EOM and lids are normal.  Neck: No JVD present. Carotid bruit is not present. No edema present. No thyroid mass and no thyromegaly present.  Cardiovascular: S1 normal and S2 normal. Exam reveals no gallop.  No murmur heard. Pulses:      Dorsalis pedis pulses are 2+ on the right side and 2+ on the left side.  Respiratory: No respiratory distress. She has decreased breath sounds in the right lower field and the left lower field. She has no wheezes. She has no rhonchi. She has no rales.  GI: Soft. Bowel sounds are normal. There is no abdominal tenderness.  Musculoskeletal:     Right ankle:  She exhibits swelling.     Left ankle: She exhibits swelling.  Lymphadenopathy:    She has no cervical adenopathy.  Neurological: She is alert and oriented to person, place, and time. No cranial nerve deficit.  Skin: Skin is warm. No rash noted. Nails show no clubbing.  Psychiatric: She has a normal mood and affect.      Data Reviewed: Basic Metabolic Panel: Recent Labs  Lab 03/29/18 1448 03/29/18 1751 03/31/18 0350  NA 144  --  140  K 4.1  --  4.2  CL 108  --  106  CO2 29  --  25  GLUCOSE 98  --  140*  BUN 29*  --  35*  CREATININE 1.40*  --  1.43*  CALCIUM 8.7*  --  8.0*  MG  --  1.9  --   PHOS  --  3.8  --    Liver Function Tests: Recent Labs  Lab 03/29/18 1448  AST 28  ALT 22  ALKPHOS 109  BILITOT 0.4  PROT 7.0  ALBUMIN 3.5   CBC: Recent Labs  Lab 03/29/18 1448  WBC 5.9  NEUTROABS 3.1  HGB 12.1  HCT 41.4  MCV 87.9  PLT 195   BNP (last 3 results) Recent Labs    01/12/18 1425  BNP 185.0*     CBG: Recent Labs  Lab 03/29/18 2345  GLUCAP 152*      Studies: Dg Chest 2 View  Result Date: 03/29/2018 CLINICAL  DATA:  Cough. EXAM: CHEST - 2 VIEW COMPARISON:  Radiographs of January 12, 2018. FINDINGS: Stable cardiomegaly with central pulmonary vascular congestion. Atherosclerosis of thoracic aorta is noted. No pneumothorax or pleural effusion is noted. No acute pulmonary disease is noted. Bony thorax is unremarkable. IMPRESSION: Stable cardiomegaly with central pulmonary vascular congestion. Aortic Atherosclerosis (ICD10-I70.0). Electronically Signed   By: Marijo Conception, M.D.   On: 03/29/2018 16:57    Scheduled Meds: . amLODipine  5 mg Oral Daily  . apixaban  5 mg Oral BID  . atorvastatin  20 mg Oral QHS  . cloNIDine  0.2 mg Oral BID  . ferrous gluconate  324 mg Oral BID  . folic acid  1 mg Oral Daily  . furosemide  20 mg Oral Daily  . insulin aspart  0-5 Units Subcutaneous QHS  . insulin aspart  0-9 Units Subcutaneous TID WC  .  ipratropium-albuterol  3 mL Nebulization BID  . letrozole  2.5 mg Oral Daily  . levothyroxine  112 mcg Oral QAC breakfast  . lisinopril  20 mg Oral Daily  . nystatin  5 mL Oral QID  . oseltamivir  75 mg Oral BID  . predniSONE  30 mg Oral Q breakfast  . sertraline  50 mg Oral Daily   Continuous Infusions:  Assessment/Plan:  1. Acute hypoxic respiratory failure secondary to the flu.  When we ambulated her today her pulse ox still dropped to 84%.  Continue oxygen supplementation today. 2. Influenza A positive.  Continue Tamiflu. 3. Asthmatic bronchitis.  Taper steroids with hypertension.  Continue nebulizer treatments. 4. Accelerated hypertension.  Added Norvasc.  PRN hydralazine IV.  Patient on clonidine and lisinopril. 5. Morbid obesity with a BMI of 54.46.  Weight loss needed 6. History of breast cancer on letrozole 7. Hypothyroidism unspecified on levothyroxine 8. History of pulmonary embolism in the past on Eliquis  Code Status:     Code Status Orders  (From admission, onward)         Start     Ordered   03/29/18 2320  Full code  Continuous     03/29/18 2319        Code Status History    Date Active Date Inactive Code Status Order ID Comments User Context   11/15/2017 2055 11/21/2017 1459 Full Code [id number]  Gorden Harms, MD Inpatient   10/18/2014 2221 10/23/2014 2043 Full Code [id number]  Jacqulynn Cadet, MD Inpatient   10/18/2014 2119 10/18/2014 2221 Full Code [id number]  Guy Begin, MD Inpatient    Advance Directive Documentation     Most Recent Value  Type of Advance Directive  Healthcare Power of Attorney, Living will  Pre-existing out of facility DNR order (yellow form or pink MOST form)  -  "MOST" Form in Place?  -      Disposition Plan: Since the patient still requires oxygen with ambulation I will hold off on discharge today.  Reevaluate tomorrow.  Antibiotics:  Tamiflu  Time spent: 27 minutes  [place]

## 2018-03-31 NOTE — Progress Notes (Signed)
Per respiratory protocol assessment, patient scored level 2, duoneb treatment frequency changed to from Q6 to BID. Patient SAT at Room air 95%. Patient resting comfortably in bed. No distress.

## 2018-04-01 LAB — BASIC METABOLIC PANEL
Anion gap: 7 (ref 5–15)
BUN: 40 mg/dL — ABNORMAL HIGH (ref 6–20)
CO2: 30 mmol/L (ref 22–32)
Calcium: 8.6 mg/dL — ABNORMAL LOW (ref 8.9–10.3)
Chloride: 106 mmol/L (ref 98–111)
Creatinine, Ser: 1.37 mg/dL — ABNORMAL HIGH (ref 0.44–1.00)
GFR calc Af Amer: 51 mL/min — ABNORMAL LOW (ref >60)
GFR calc non Af Amer: 44 mL/min — ABNORMAL LOW (ref >60)
Glucose, Bld: 96 mg/dL (ref 70–99)
Potassium: 4 mmol/L (ref 3.5–5.1)
Sodium: 143 mmol/L (ref 135–145)

## 2018-04-01 LAB — GLUCOSE, CAPILLARY
Glucose-Capillary: 148 mg/dL — ABNORMAL HIGH (ref 70–99)
Glucose-Capillary: 186 mg/dL — ABNORMAL HIGH (ref 70–99)
Glucose-Capillary: 159 mg/dL — ABNORMAL HIGH (ref 70–99)
Glucose-Capillary: 134 mg/dL — ABNORMAL HIGH (ref 70–99)
Glucose-Capillary: 93 mg/dL (ref 70–99)
Glucose-Capillary: 110 mg/dL — ABNORMAL HIGH (ref 70–99)
Glucose-Capillary: 104 mg/dL — ABNORMAL HIGH (ref 70–99)
Glucose-Capillary: 86 mg/dL (ref 70–99)
Glucose-Capillary: 82 mg/dL (ref 70–99)
Glucose-Capillary: 128 mg/dL — ABNORMAL HIGH (ref 70–99)

## 2018-04-01 LAB — CALCIUM, IONIZED: Calcium, Ionized, Serum: 4.8 mg/dL (ref 4.5–5.6)

## 2018-04-01 LAB — CBC
HCT: 39.4 % (ref 36.0–46.0)
Hemoglobin: 11.7 g/dL — ABNORMAL LOW (ref 12.0–15.0)
MCH: 25.5 pg — ABNORMAL LOW (ref 26.0–34.0)
MCHC: 29.7 g/dL — ABNORMAL LOW (ref 30.0–36.0)
MCV: 86 fL (ref 80.0–100.0)
Platelets: 180 10*3/uL (ref 150–400)
RBC: 4.58 MIL/uL (ref 3.87–5.11)
RDW: 15.2 % (ref 11.5–15.5)
WBC: 7.4 10*3/uL (ref 4.0–10.5)
nRBC: 0 % (ref 0.0–0.2)

## 2018-04-01 MED ORDER — AMLODIPINE BESYLATE 10 MG PO TABS
10.0000 mg | ORAL_TABLET | Freq: Every day | ORAL | Status: DC
  Administered 2018-04-02 – 2018-04-03 (×2): 10 mg via ORAL
  Filled 2018-04-01 (×2): qty 1

## 2018-04-01 MED ORDER — AMLODIPINE BESYLATE 5 MG PO TABS
5.0000 mg | ORAL_TABLET | Freq: Once | ORAL | Status: AC
  Administered 2018-04-01: 5 mg via ORAL
  Filled 2018-04-01: qty 1

## 2018-04-01 MED ORDER — SODIUM CHLORIDE 0.9% FLUSH
3.0000 mL | Freq: Two times a day (BID) | INTRAVENOUS | Status: DC
  Administered 2018-04-01 – 2018-04-03 (×4): 3 mL via INTRAVENOUS

## 2018-04-01 MED ORDER — FUROSEMIDE 40 MG PO TABS
40.0000 mg | ORAL_TABLET | Freq: Two times a day (BID) | ORAL | Status: DC
  Administered 2018-04-01 – 2018-04-02 (×2): 40 mg via ORAL
  Filled 2018-04-01 (×2): qty 1

## 2018-04-01 MED ORDER — POTASSIUM CHLORIDE CRYS ER 20 MEQ PO TBCR
20.0000 meq | EXTENDED_RELEASE_TABLET | Freq: Two times a day (BID) | ORAL | Status: DC
  Administered 2018-04-01 – 2018-04-03 (×5): 20 meq via ORAL
  Filled 2018-04-01 (×5): qty 1

## 2018-04-01 NOTE — Progress Notes (Addendum)
Patient ID: RAYLEI LOSURDO, female   DOB: 13-Jun-1965, 53 y.o.   MRN: [id number]  [facility] PROGRESS NOTE  ARTHEA NOBEL KNL:[id number] DOB: 22-Aug-1965 DOA: 03/29/2018 PCP: Lenard Simmer, MD  HPI/Subjective: Patient still with some cough shortness of breath.  Feeling better.  Objective: Vitals:   04/01/18 0752 04/01/18 0810  BP: (!) 154/64   Pulse: (!) 53 (!) 116  Resp: 20   Temp: 98.2 F (36.8 C)   SpO2: 97% (!) 82%    Filed Weights   03/29/18 2339  Weight: (!) 143.9 kg    ROS: Review of Systems  Constitutional: Negative for chills and fever.  Eyes: Negative for blurred vision.  Respiratory: Positive for cough and shortness of breath.   Cardiovascular: Negative for chest pain.  Gastrointestinal: Negative for abdominal pain, constipation, diarrhea, nausea and vomiting.  Genitourinary: Negative for dysuria.  Musculoskeletal: Negative for joint pain.  Neurological: Negative for dizziness and headaches.   Exam: Physical Exam  Constitutional: She is oriented to person, place, and time.  HENT:  Nose: No mucosal edema.  Mouth/Throat: No oropharyngeal exudate or posterior oropharyngeal edema.  Eyes: Pupils are equal, round, and reactive to light. Conjunctivae, EOM and lids are normal.  Neck: No JVD present. Carotid bruit is not present. No edema present. No thyroid mass and no thyromegaly present.  Cardiovascular: S1 normal and S2 normal. Exam reveals no gallop.  No murmur heard. Pulses:      Dorsalis pedis pulses are 2+ on the right side and 2+ on the left side.  Respiratory: No respiratory distress. She has decreased breath sounds in the right lower field and the left lower field. She has no wheezes. She has no rhonchi. She has no rales.  GI: Soft. Bowel sounds are normal. There is no abdominal tenderness.  Musculoskeletal:     Right ankle: She exhibits swelling.     Left ankle: She exhibits swelling.  Lymphadenopathy:    She has no cervical adenopathy.   Neurological: She is alert and oriented to person, place, and time. No cranial nerve deficit.  Skin: Skin is warm. No rash noted. Nails show no clubbing.  Psychiatric: She has a normal mood and affect.      Data Reviewed: Basic Metabolic Panel: Recent Labs  Lab 03/29/18 1448 03/29/18 1751 03/31/18 0350 04/01/18 0538  NA 144  --  140 143  K 4.1  --  4.2 4.0  CL 108  --  106 106  CO2 29  --  25 30  GLUCOSE 98  --  140* 96  BUN 29*  --  35* 40*  CREATININE 1.40*  --  1.43* 1.37*  CALCIUM 8.7*  --  8.0* 8.6*  MG  --  1.9  --   --   PHOS  --  3.8  --   --    Liver Function Tests: Recent Labs  Lab 03/29/18 1448  AST 28  ALT 22  ALKPHOS 109  BILITOT 0.4  PROT 7.0  ALBUMIN 3.5   CBC: Recent Labs  Lab 03/29/18 1448 04/01/18 0538  WBC 5.9 7.4  NEUTROABS 3.1  --   HGB 12.1 11.7*  HCT 41.4 39.4  MCV 87.9 86.0  PLT 195 180   BNP (last 3 results) Recent Labs    01/12/18 1425  BNP 185.0*     CBG: Recent Labs  Lab 03/31/18 1143 03/31/18 1627 03/31/18 2151 04/01/18 0813 04/01/18 1200  GLUCAP 110* 104* 86 82 128*  Scheduled Meds: . [START ON 04/02/2018] amLODipine  10 mg Oral Daily  . apixaban  5 mg Oral BID  . atorvastatin  20 mg Oral QHS  . cloNIDine  0.2 mg Oral BID  . ferrous gluconate  324 mg Oral BID  . folic acid  1 mg Oral Daily  . furosemide  20 mg Oral BID  . insulin aspart  0-5 Units Subcutaneous QHS  . insulin aspart  0-9 Units Subcutaneous TID WC  . ipratropium-albuterol  3 mL Nebulization BID  . letrozole  2.5 mg Oral Daily  . levothyroxine  112 mcg Oral QAC breakfast  . lisinopril  20 mg Oral Daily  . oseltamivir  75 mg Oral BID  . predniSONE  30 mg Oral Q breakfast  . sertraline  50 mg Oral Daily    Assessment/Plan:  1. Acute hypoxic respiratory failure secondary to the flu.  When we ambulated her today her pulse ox still dropped to 82%.  Continue oxygen supplementation today. 2. Influenza A positive.  Continue  Tamiflu. 3. Asthmatic bronchitis.  Taper steroids with hypertension.  Continue nebulizer treatments. 4. Accelerated hypertension.  Continue Norvasc.  PRN hydralazine IV.  Patient on clonidine and lisinopril. 5. Morbid obesity with a BMI of 54.46.  Weight loss needed 6. History of breast cancer on letrozole 7. Hypothyroidism unspecified on levothyroxine 8. History of pulmonary embolism in the past on Eliquis 9. History of diastolic congestive heart failure.  Patient on oral Lasix.    Code Status:     Code Status Orders  (From admission, onward)         Start     Ordered   03/29/18 2320  Full code  Continuous     03/29/18 2319        Code Status History    Date Active Date Inactive Code Status Order ID Comments User Context   11/15/2017 2055 11/21/2017 1459 Full Code [id number]  Gorden Harms, MD Inpatient   10/18/2014 EF 40 2221 10/23/2014 2043 Full Code [id number]  Jacqulynn Cadet, MD Inpatient   10/18/2014 2119 10/18/2014 2221 Full Code [id number]  Guy Begin, MD Inpatient    Advance Directive Documentation     Most Recent Value  Type of Advance Directive  Healthcare Power of Attorney, Living will  Pre-existing out of facility DNR order (yellow form or pink MOST form)  -  "MOST" Form in Place?  -      Disposition Plan: Patient will need to hold her saturations prior to disposition.  Evaluate daily  Antibiotics:  Tamiflu  Time spent: 26 minutes  [place]

## 2018-04-02 LAB — GLUCOSE, CAPILLARY
Glucose-Capillary: 140 mg/dL — ABNORMAL HIGH (ref 70–99)
Glucose-Capillary: 155 mg/dL — ABNORMAL HIGH (ref 70–99)
Glucose-Capillary: 96 mg/dL (ref 70–99)
Glucose-Capillary: 149 mg/dL — ABNORMAL HIGH (ref 70–99)

## 2018-04-02 MED ORDER — NYSTATIN 100000 UNIT/ML MT SUSP
5.0000 mL | Freq: Four times a day (QID) | OROMUCOSAL | Status: DC
  Administered 2018-04-02 – 2018-04-03 (×4): 500000 [IU] via ORAL
  Filled 2018-04-02 (×4): qty 5

## 2018-04-02 MED ORDER — FUROSEMIDE 20 MG PO TABS
20.0000 mg | ORAL_TABLET | Freq: Every day | ORAL | Status: DC
  Administered 2018-04-03: 20 mg via ORAL
  Filled 2018-04-02: qty 1

## 2018-04-02 NOTE — Progress Notes (Signed)
Patient ambulated in hall on RA. O2 sat decreased to 86% with activity, at rest came back up to 93%.  Patient states no c/o SOB or discomfort with activity.  Does not appear to be SOB to this RN.

## 2018-04-02 NOTE — Progress Notes (Signed)
Patient up in hallway to ambulate on room air with mask in place.  Oxygen saturations decreased to 84% with activity (>92% at rest).

## 2018-04-02 NOTE — Progress Notes (Signed)
Patient ID: Nicole Schmidt, female   DOB: February 04, 1966, 53 y.o.   MRN: [id number]  [facility] PROGRESS NOTE  [name] SNK:[id number] DOB: Jun 21, 1965 DOA: 03/29/2018 PCP: Lenard Simmer, MD  HPI/Subjective: Patient feeling a little bit better today.  Walked around further today.  Pulse ox still dropped down but not as much as yesterday.  Some cough.  Some shortness of breath.  Objective: Vitals:   04/02/18 0809 04/02/18 1602  BP:  (!) 147/70  Pulse:  63  Resp:  19  Temp:    SpO2: 97% 97%    Filed Weights   03/29/18 2339 04/02/18 0422  Weight: (!) 143.9 kg (!) 144.6 kg    ROS: Review of Systems  Constitutional: Negative for chills and fever.  Eyes: Negative for blurred vision.  Respiratory: Positive for cough and shortness of breath.   Cardiovascular: Negative for chest pain.  Gastrointestinal: Negative for abdominal pain, constipation, diarrhea, nausea and vomiting.  Genitourinary: Negative for dysuria.  Musculoskeletal: Negative for joint pain.  Neurological: Negative for dizziness and headaches.   Exam: Physical Exam  Constitutional: She is oriented to person, place, and time.  HENT:  Nose: No mucosal edema.  Mouth/Throat: No oropharyngeal exudate or posterior oropharyngeal edema.  Eyes: Pupils are equal, round, and reactive to light. Conjunctivae, EOM and lids are normal.  Neck: No JVD present. Carotid bruit is not present. No edema present. No thyroid mass and no thyromegaly present.  Cardiovascular: S1 normal and S2 normal. Exam reveals no gallop.  No murmur heard. Pulses:      Dorsalis pedis pulses are 2+ on the right side and 2+ on the left side.  Respiratory: No respiratory distress. She has decreased breath sounds in the right lower field and the left lower field. She has no wheezes. She has no rhonchi. She has no rales.  GI: Soft. Bowel sounds are normal. There is no abdominal tenderness.  Musculoskeletal:     Right ankle: She exhibits swelling.    Left ankle: She exhibits swelling.  Lymphadenopathy:    She has no cervical adenopathy.  Neurological: She is alert and oriented to person, place, and time. No cranial nerve deficit.  Skin: Skin is warm. No rash noted. Nails show no clubbing.  Psychiatric: She has a normal mood and affect.      Data Reviewed: Basic Metabolic Panel: Recent Labs  Lab 03/29/18 1448 03/29/18 1751 03/31/18 0350 04/01/18 0538  NA 144  --  140 143  K 4.1  --  4.2 4.0  CL 108  --  106 106  CO2 29  --  25 30  GLUCOSE 98  --  140* 96  BUN 29*  --  35* 40*  CREATININE 1.40*  --  1.43* 1.37*  CALCIUM 8.7*  --  8.0* 8.6*  MG  --  1.9  --   --   PHOS  --  3.8  --   --    Liver Function Tests: Recent Labs  Lab 03/29/18 1448  AST 28  ALT 22  ALKPHOS 109  BILITOT 0.4  PROT 7.0  ALBUMIN 3.5   CBC: Recent Labs  Lab 03/29/18 1448 04/01/18 0538  WBC 5.9 7.4  NEUTROABS 3.1  --   HGB 12.1 11.7*  HCT 41.4 39.4  MCV 87.9 86.0  PLT 195 180   BNP (last 3 results) Recent Labs    01/12/18 1425  BNP 185.0*     CBG: Recent Labs  Lab 04/01/18 1200 04/01/18  1705 04/01/18 2114 04/02/18 0807 04/02/18 1137  GLUCAP 128* 140* 155* 96 149*      Scheduled Meds: . amLODipine  10 mg Oral Daily  . apixaban  5 mg Oral BID  . atorvastatin  20 mg Oral QHS  . cloNIDine  0.2 mg Oral BID  . ferrous gluconate  324 mg Oral BID  . folic acid  1 mg Oral Daily  . [START ON 04/03/2018] furosemide  20 mg Oral Daily  . insulin aspart  0-5 Units Subcutaneous QHS  . insulin aspart  0-9 Units Subcutaneous TID WC  . ipratropium-albuterol  3 mL Nebulization BID  . letrozole  2.5 mg Oral Daily  . levothyroxine  112 mcg Oral QAC breakfast  . lisinopril  20 mg Oral Daily  . nystatin  5 mL Oral QID  . oseltamivir  75 mg Oral BID  . potassium chloride  20 mEq Oral BID  . sertraline  50 mg Oral Daily  . sodium chloride flush  3 mL Intravenous Q12H    Assessment/Plan:  1. Acute hypoxic respiratory failure  secondary to the flu.  When we ambulated her today her pulse ox still dropped to 86%.  Continue trying to ambulate.  Hopefully tomorrow her pulse ox will hold above 88%. 2. Influenza A positive.  Continue Tamiflu. 3. Asthmatic bronchitis.  Discontinue steroids.  Continue nebulizer treatments. 4. Accelerated hypertension on presentation.  Continue Norvasc.  PRN hydralazine IV.  Patient on clonidine and lisinopril. 5. Morbid obesity with a BMI of 54.70.  Weight loss needed 6. History of breast cancer on letrozole 7. Hypothyroidism unspecified on levothyroxine 8. History of pulmonary embolism in the past on Eliquis 9. History of diastolic congestive heart failure.  Patient on oral Lasix.  10. Chronic kidney disease stage III.  Continue to monitor.  Code Status:     Code Status Orders  (From admission, onward)         Start     Ordered   03/29/18 2320  Full code  Continuous     03/29/18 2319        Code Status History    Date Active Date Inactive Code Status Order ID Comments User Context   11/15/2017 2055 11/21/2017 1459 Full Code [id number]  Gorden Harms, MD Inpatient   10/18/2014 EF 40 2221 10/23/2014 2043 Full Code [id number]  Jacqulynn Cadet, MD Inpatient   10/18/2014 2119 10/18/2014 2221 Full Code [id number]  Guy Begin, MD Inpatient    Advance Directive Documentation     Most Recent Value  Type of Advance Directive  Healthcare Power of [place], Living will  Pre-existing out of facility DNR order (yellow form or pink MOST form)  -  "MOST" Form in Place?  -      Disposition Plan: Would like patient saturations to be above 88% with ambulation prior to discharge home.  Antibiotics:  Tamiflu  Time spent: 27 minutes  [place]

## 2018-04-03 LAB — BASIC METABOLIC PANEL
Anion gap: 8 (ref 5–15)
BUN: 47 mg/dL — ABNORMAL HIGH (ref 6–20)
CO2: 28 mmol/L (ref 22–32)
Calcium: 8.5 mg/dL — ABNORMAL LOW (ref 8.9–10.3)
Chloride: 105 mmol/L (ref 98–111)
Creatinine, Ser: 1.37 mg/dL — ABNORMAL HIGH (ref 0.44–1.00)
GFR calc Af Amer: 51 mL/min — ABNORMAL LOW (ref >60)
GFR calc non Af Amer: 44 mL/min — ABNORMAL LOW (ref >60)
Glucose, Bld: 93 mg/dL (ref 70–99)
Potassium: 4.3 mmol/L (ref 3.5–5.1)
Sodium: 141 mmol/L (ref 135–145)

## 2018-04-03 LAB — GLUCOSE, CAPILLARY
Glucose-Capillary: 117 mg/dL — ABNORMAL HIGH (ref 70–99)
Glucose-Capillary: 114 mg/dL — ABNORMAL HIGH (ref 70–99)
Glucose-Capillary: 98 mg/dL (ref 70–99)

## 2018-04-03 MED ORDER — AMLODIPINE BESYLATE 10 MG PO TABS: 10.0000 mg | ORAL_TABLET | Freq: Every day | ORAL | 0 refills | Status: DC

## 2018-04-03 MED ORDER — CLONIDINE HCL 0.2 MG PO TABS: 0.2000 mg | ORAL_TABLET | Freq: Two times a day (BID) | ORAL | 0 refills | Status: DC

## 2018-04-03 MED ORDER — NYSTATIN 100000 UNIT/ML MT SUSP: 5.0000 mL | Freq: Four times a day (QID) | OROMUCOSAL | 0 refills | Status: DC

## 2018-04-03 MED ORDER — LISINOPRIL 20 MG PO TABS: 20.0000 mg | ORAL_TABLET | Freq: Every day | ORAL | 0 refills | Status: DC

## 2018-04-03 MED ORDER — OSELTAMIVIR PHOSPHATE 75 MG PO CAPS: 75.0000 mg | ORAL_CAPSULE | Freq: Two times a day (BID) | ORAL | 0 refills | Status: DC

## 2018-04-03 NOTE — Progress Notes (Signed)
Patient ID: Nicole Schmidt   Work note  Patient admitted to [facility] on 03/29/2018 and discharged on 04/03/2018.  Please excuse from work from 03/29/2018 through 04/09/2018.  May return to work on 04/10/2018.  Dr. Loletha Grayer [phone]

## 2018-04-03 NOTE — Progress Notes (Signed)
Called pt's pharmacy and notified them of the change in the discharge medications. Pt only needs to take one more cap of the tamiflu tonight and amlodipine dose was increased to 10 mg from 5 mg.   Thanks,   Eleonore Chiquito, PharmD, BCPS

## 2018-04-03 NOTE — Discharge Summary (Signed)
[place] [facility] [place] NAME: Nicole Schmidt    MR#:  [id number]  DATE OF BIRTH:  December 07, 1965  DATE OF ADMISSION:  03/29/2018 ADMITTING PHYSICIAN: Arta Silence, MD  DATE OF DISCHARGE: 04/03/2018  1:08 PM  PRIMARY CARE PHYSICIAN: Lenard Simmer, MD    ADMISSION DIAGNOSIS:  Influenza A [J10.1] COPD exacerbation ([place]) [J44.1]  DISCHARGE DIAGNOSIS:  Active Problems:   Acute hypoxemic respiratory failure ([place])   SECONDARY DIAGNOSIS:   Past Medical History:  Diagnosis Date  . Anemia   . Arthritis   . Breast cancer of upper-inner quadrant of right female breast ([place])    Right breast invasive CA and DCIS , 7 mm T1,N0,M0. Er/PR pos, her 2 negative.  Margins 1 mm.  . CHF (congestive heart failure) ([place])   . Diabetes mellitus without complication ([place])   . Gout   . Gout   . Hypertension   . Menopause    age 41  . Personal history of radiation therapy   . Pulmonary embolism ([place]) 10/2014  . Renal insufficiency   . Thyroid goiter     HOSPITAL COURSE:   1.  Acute hypoxic respiratory failure secondary to influenza.  Every day we have been ambulating her and her pulse ox has been dropping down.  Today was the first day where her pulse ox dropped down to 88% at the lowest but quickly improved.  Patient will go home today. 2.  Influenza A positive.  Patient has 1 more dose of Tamiflu this evening. 3.  Asthmatic bronchitis.  I discontinued steroids since patients with the flu sometimes do worse with steroids.  We did nebulizer treatments while here.  Can go back on inhaler as outpatient.  Patient will continue to improve as outpatient.  No wheeze heard on discharge. 4.  Accelerated hypertension on presentation.  The patient was increased on clonidine to twice daily dosing.  Restarted lisinopril and Norvasc. 5.  Morbid obesity with a BMI of 54.43.  Weight loss needed 6.  History of breast cancer on letrozole 7.  Hypothyroidism unspecified on  levothyroxine 8.  History of pulmonary embolism on Eliquis 9.  History of diastolic congestive heart failure.  Patient on low-dose Lasix.  Patient's symptoms were all secondary to respiratory issues rather than heart failure. 10.  Chronic kidney disease stage III.  Continue to monitor as outpatient 11.  Thrush from steroids and steroid nebulizers.  Nystatin swish and swallow.  DISCHARGE CONDITIONS:   Satisfactory  CONSULTS OBTAINED:  None  DRUG ALLERGIES:  No Known Allergies  DISCHARGE MEDICATIONS:   Allergies as of 04/03/2018   No Known Allergies     Medication List    TAKE these medications   ALPRAZolam 0.5 MG tablet Commonly known as:  XANAX Take 0.5 mg by mouth as needed for anxiety.   amLODipine 10 MG tablet Commonly known as:  NORVASC Take 1 tablet (10 mg total) by mouth daily. Start taking on:  April 04, 2018   atorvastatin 20 MG tablet Commonly known as:  LIPITOR Take 20 mg by mouth at bedtime.   calcium carbonate 1500 (600 Ca) MG Tabs tablet Commonly known as:  OSCAL Take 600 mg of elemental calcium by mouth 2 (two) times daily with a meal.   CINNAMON PO Take 1,000 mg by mouth 2 (two) times daily.   cloNIDine 0.2 MG tablet Commonly known as:  CATAPRES Take 1 tablet (0.2 mg total) by mouth 2 (two) times daily. What changed:  when to take this   ELIQUIS 5 MG Tabs tablet Generic drug:  apixaban TAKE 1 TABLET TWICE A DAY What changed:  how much to take   ferrous gluconate 240 (27 FE) MG tablet Commonly known as:  FERGON Take 240 mg by mouth 2 (two) times daily.   folic acid 448 MCG tablet Commonly known as:  FOLVITE Take 800 mcg by mouth daily.   furosemide 20 MG tablet Commonly known as:  LASIX Take 20 mg by mouth daily.   letrozole 2.5 MG tablet Commonly known as:  FEMARA TAKE 1 TABLET DAILY   levothyroxine 112 MCG tablet Commonly known as:  SYNTHROID, LEVOTHROID Take 112 mcg daily before breakfast by mouth.   lisinopril 20 MG  tablet Commonly known as:  PRINIVIL,ZESTRIL Take 1 tablet (20 mg total) by mouth daily.   nystatin 100000 UNIT/ML suspension Commonly known as:  MYCOSTATIN Take 5 mLs (500,000 Units total) by mouth 4 (four) times daily.   nystatin 100000 UNIT/ML suspension Commonly known as:  MYCOSTATIN Take 5 mLs (500,000 Units total) by mouth 4 (four) times daily.   nystatin powder Commonly known as:  nystatin Apply topically to rash 3 times a day   oseltamivir 75 MG capsule Commonly known as:  TAMIFLU Take 1 capsule (75 mg total) by mouth 2 (two) times daily.   sertraline 50 MG tablet Commonly known as:  ZOLOFT Take 50 mg by mouth daily.   TRULICITY 1.5 JE/5.6DJ Sopn Generic drug:  Dulaglutide Inject 1.5 mg into the skin every Monday. night   VITAMIN C PO Take 1 tablet by mouth 2 (two) times daily.   Vitamin D3 1.25 MG (50000 UT) Tabs Take 1 tablet by mouth once a week.        DISCHARGE INSTRUCTIONS:   Follow-up PMD 5 days  If you experience worsening of your admission symptoms, develop shortness of breath, life threatening emergency, suicidal or homicidal thoughts you must seek medical attention immediately by calling 911 or calling your MD immediately  if symptoms less severe.  You Must read complete instructions/literature along with all the possible adverse reactions/side effects for all the Medicines you take and that have been prescribed to you. Take any new Medicines after you have completely understood and accept all the possible adverse reactions/side effects.   Please note  You were cared for by a hospitalist during your hospital stay. If you have any questions about your discharge medications or the care you received while you were in the hospital after you are discharged, you can call the unit and asked to speak with the hospitalist on call if the hospitalist that took care of you is not available. Once you are discharged, your primary care physician will handle any  further medical issues. Please note that NO REFILLS for any discharge medications will be authorized once you are discharged, as it is imperative that you return to your primary care physician (or establish a relationship with a primary care physician if you do not have one) for your aftercare needs so that they can reassess your need for medications and monitor your lab values.    Today   CHIEF COMPLAINT:  No chief complaint on file.   HISTORY OF PRESENT ILLNESS:  Nicole Schmidt  is a 53 y.o. female sent in for hypoxia and shortness of breath and cough found to have the flu.   VITAL SIGNS:  Blood pressure (!) 145/55, pulse 73, temperature 98.1 F (36.7 C), temperature source Oral, resp. rate 20,  height 5\' 4"  (1.626 m), weight (!) 143.8 kg, last menstrual period 07/17/2012, SpO2 97 %.    PHYSICAL EXAMINATION:  GENERAL:  53 y.o.-year-old patient lying in the bed with no acute distress.  EYES: Pupils equal, round, reactive to light and accommodation. No scleral icterus. Extraocular muscles intact.  HEENT: Head atraumatic, normocephalic. Oropharynx and nasopharynx clear.  NECK:  Supple, no jugular venous distention. No thyroid enlargement, no tenderness.  LUNGS: Decreased breath sounds bilaterally, no wheezing, rales,rhonchi or crepitation. No use of accessory muscles of respiration.  CARDIOVASCULAR: S1, S2 normal. No murmurs, rubs, or gallops.  ABDOMEN: Soft, non-tender, non-distended. Bowel sounds present. No organomegaly or mass.  EXTREMITIES: 2+ pedal edema, no cyanosis, or clubbing.  NEUROLOGIC: Cranial nerves II through XII are intact. Muscle strength 5/5 in all extremities. Sensation intact. Gait not checked.  PSYCHIATRIC: The patient is alert and oriented x 3.  SKIN: No obvious rash, lesion, or ulcer.   DATA REVIEW:   CBC Recent Labs  Lab 04/01/18 0538  WBC 7.4  HGB 11.7*  HCT 39.4  PLT 180    Chemistries  Recent Labs  Lab 03/29/18 1448 03/29/18 1751   04/03/18 0552  NA 144  --    < > 141  K 4.1  --    < > 4.3  CL 108  --    < > 105  CO2 29  --    < > 28  GLUCOSE 98  --    < > 93  BUN 29*  --    < > 47*  CREATININE 1.40*  --    < > 1.37*  CALCIUM 8.7*  --    < > 8.5*  MG  --  1.9  --   --   AST 28  --   --   --   ALT 22  --   --   --   ALKPHOS 109  --   --   --   BILITOT 0.4  --   --   --    < > = values in this interval not displayed.     Management plans discussed with the patient, and she is in agreement.  CODE STATUS:     Code Status Orders  (From admission, onward)         Start     Ordered   03/29/18 2320  Full code  Continuous     03/29/18 2319        Code Status History    Date Active Date Inactive Code Status Order ID Comments User Context   11/15/2017 2055 11/21/2017 1459 Full Code [id number]  Gorden Harms, MD Inpatient   10/18/2014 2221 10/23/2014 2043 Full Code [id number]  Jacqulynn Cadet, MD Inpatient   10/18/2014 2119 10/18/2014 2221 Full Code [id number]  Guy Begin, MD Inpatient    Advance Directive Documentation     Most Recent Value  Type of Advance Directive  Healthcare Power of [place], Living will  Pre-existing out of facility DNR order (yellow form or pink MOST form)  -  "MOST" Form in Place?  -      TOTAL TIME TAKING CARE OF THIS PATIENT: 35 minutes.    Loletha Grayer M.D on 04/03/2018 at 2:29 PM  Between 7am to 6pm - Pager - [phone]  After 6pm go to [email or web address] - password [email or web address]  [facility] Office  [phone]  CC: Primary care physician; Lenard Simmer, MD

## 2018-04-03 NOTE — Progress Notes (Signed)
Patient discharged to home.  Tele and IV d/c'd prior to discharge.  Patient states has all belongings.

## 2018-04-03 NOTE — Progress Notes (Signed)
Patient ambulated in hall.  O2 sat noted to go down to 88% for a time.  When patient stopped, went back to 93%.  No c/o SOB with activity.  Dr. Leslye Peer aware.

## 2018-05-16 ENCOUNTER — Other Ambulatory Visit: Payer: Self-pay

## 2018-05-16 DIAGNOSIS — D509 Iron deficiency anemia, unspecified: Secondary | ICD-10-CM

## 2018-05-20 ENCOUNTER — Other Ambulatory Visit: Payer: BLUE CROSS/BLUE SHIELD

## 2018-05-20 ENCOUNTER — Ambulatory Visit: Payer: BLUE CROSS/BLUE SHIELD | Admitting: Hematology and Oncology

## 2018-05-21 ENCOUNTER — Other Ambulatory Visit: Payer: Self-pay

## 2018-05-22 ENCOUNTER — Telehealth: Payer: Self-pay

## 2018-05-22 ENCOUNTER — Inpatient Hospital Stay: Payer: BLUE CROSS/BLUE SHIELD | Attending: Hematology and Oncology | Admitting: Hematology and Oncology

## 2018-05-22 ENCOUNTER — Other Ambulatory Visit: Payer: BLUE CROSS/BLUE SHIELD

## 2018-05-22 ENCOUNTER — Encounter: Payer: Self-pay | Admitting: Hematology and Oncology

## 2018-05-22 DIAGNOSIS — I2782 Chronic pulmonary embolism: Secondary | ICD-10-CM

## 2018-05-22 DIAGNOSIS — Z170 Estrogen receptor positive status [ER+]: Secondary | ICD-10-CM

## 2018-05-22 DIAGNOSIS — I10 Essential (primary) hypertension: Secondary | ICD-10-CM

## 2018-05-22 DIAGNOSIS — C50911 Malignant neoplasm of unspecified site of right female breast: Secondary | ICD-10-CM

## 2018-05-22 DIAGNOSIS — N183 Chronic kidney disease, stage 3 unspecified: Secondary | ICD-10-CM

## 2018-05-22 DIAGNOSIS — D509 Iron deficiency anemia, unspecified: Secondary | ICD-10-CM

## 2018-05-22 DIAGNOSIS — Z7901 Long term (current) use of anticoagulants: Secondary | ICD-10-CM

## 2018-05-22 DIAGNOSIS — Z86711 Personal history of pulmonary embolism: Secondary | ICD-10-CM

## 2018-05-22 DIAGNOSIS — E119 Type 2 diabetes mellitus without complications: Secondary | ICD-10-CM

## 2018-05-22 DIAGNOSIS — Z79899 Other long term (current) drug therapy: Secondary | ICD-10-CM

## 2018-05-22 NOTE — Progress Notes (Signed)
[facility] [address]  [address], [address] [place], [place] [address] Phone: [phone]  Fax: [phone]   Telephone Office Visit:  05/22/2018   Referring physician:  Lenard Simmer, MD   I connected with [name] on 05/22/18 at 3:45 PM EDT by telephone and verified that I was speaking with the correct person using 2 identifiers.  The patient was at home.  I discussed the limitations, risk, security and privacy concerns of performing an evaluation and management service by telephone and the availability of in person appointments.  I also discussed with the patient that there may be a patient responsible charge related to this service.  The patient expressed understanding and agreed to proceed.    Chief Complaint: Nicole Schmidt is a 53 y.o. female with stage I breast cancer, history of submassive pulmonary embolism, and iron deficiency anemia who is evaluated for 2 month assessment.   HPI:  The patient was last seen in the medical oncology clinic on 03/29/2018.  At that time, she had flu like symptoms that had been worsened over the last week. She had low grade fevers (Tmax 100) and a productive (green sputum) cough. She had generalized myalgias. Blood pressure was elevated in clinic (SBP > 200).  She was using OTC cold medications for symptomatic relief.  Exam revealed an acutely ill appearing female who was SOB at rest. Room air oxygen saturations were 90-93%; worsening with exertion.   CXR revealed no airspace disease.  Influenza A was positive.  Given her symptoms (renal insufficiency, HTN, and relatively low oxygen saturations), she was sent to ED from clinic for further evaluation, rehydration, and possible admission.   She was admitted to [facility] from 03/29/2018 - 04/03/2018.  She received Tamiflu for influenza A.  She remained hospitalized secondary to hypoxia.  She did not go home on oxygen.  During the interim, she has been fine.  She states that she is  "trying to stay well".  She denies any symptoms.  Blood pressure is better.  She saw Dr. Ronnald Collum and was told her A1c was slightly elevated.  She denies any breast concerns.   Past Medical History:  Diagnosis Date   Anemia    Arthritis    Breast cancer of upper-inner quadrant of right female breast ([place])    Right breast invasive CA and DCIS , 7 mm T1,N0,M0. Er/PR pos, her 2 negative.  Margins 1 mm.   CHF (congestive heart failure) (HCC)    Diabetes mellitus without complication ([place])    Gout    Gout    Hypertension    Menopause    age 47   Personal history of radiation therapy    Pulmonary embolism ([place]) 10/2014   Renal insufficiency    Thyroid goiter     Past Surgical History:  Procedure Laterality Date   BREAST BIOPSY Right 2014   breast ca   BREAST EXCISIONAL BIOPSY Right 07/19/2012   breast ca   BREAST SURGERY Right 2014   with sentinel node bx subareaolar duct excision   COLONOSCOPY WITH PROPOFOL N/A 01/30/2017   Procedure: COLONOSCOPY WITH PROPOFOL;  Surgeon: Christene Lye, MD;  Location: [facility] ENDOSCOPY;  Service: Endoscopy;  Laterality: N/A;    Family History  Problem Relation Age of Onset   Diabetes Father    Hypertension Father    Parkinson's disease Father    Hypertension Mother    Rheum arthritis Mother    Atrial fibrillation Mother  Heart failure Mother    Colon cancer Cousin 3   Breast cancer Neg Hx     Social History:  reports that she has never smoked. She has never used smokeless tobacco. She reports that she does not drink alcohol or use drugs.  Her mother died of a MI.  Her father died in 2015-05-30.  He had Parkinson's disease and dementia.  She is an only child.  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  She has never married.  Participants in the patient's visit included the patient and Waymon Budge, RN today.  The intake visit was provided by Waymon Budge, RN.   Allergies:  No Known Allergies  Current Medications: Current Outpatient Medications  Medication Sig Dispense Refill   ALPRAZolam (XANAX) 0.5 MG tablet Take 0.5 mg by mouth as needed for anxiety.      Ascorbic Acid (VITAMIN C PO) Take 1 tablet by mouth 2 (two) times daily.      atorvastatin (LIPITOR) 20 MG tablet Take 20 mg by mouth at bedtime.      calcium carbonate (OSCAL) 1500 (600 Ca) MG TABS tablet Take 600 mg of elemental calcium by mouth daily.      Cholecalciferol (VITAMIN D3) 50000 units TABS Take 1 tablet by mouth once a week.     CINNAMON PO Take 1,000 mg by mouth daily.      cloNIDine (CATAPRES) 0.2 MG tablet Take 1 tablet (0.2 mg total) by mouth 2 (two) times daily. (Patient taking differently: Take 0.2 mg by mouth daily. ) 60 tablet 0   Dulaglutide (TRULICITY) 1.5 ZJ/6.7HA SOPN Inject 1.5 mg into the skin every Monday. night     ELIQUIS 5 MG TABS tablet TAKE 1 TABLET TWICE A DAY (Patient taking differently: Take 5 mg by mouth 2 (two) times daily. ) 180 tablet 1   ferrous gluconate (FERGON) 240 (27 FE) MG tablet Take 240 mg by mouth 2 (two) times daily.     folic acid (FOLVITE) 193 MCG tablet Take 800 mcg by mouth daily.      furosemide (LASIX) 20 MG tablet Take 20 mg by mouth daily.     letrozole (FEMARA) 2.5 MG tablet TAKE 1 TABLET DAILY (Patient taking differently: Take 2.5 mg by mouth daily. ) 90 tablet 1   levothyroxine (SYNTHROID, LEVOTHROID) 112 MCG tablet Take 112 mcg daily before breakfast by mouth.     nystatin (MYCOSTATIN) 100000 UNIT/ML suspension Take 5 mLs (500,000 Units total) by mouth 4 (four) times daily. (Patient taking differently: Take 5 mLs by mouth 2 (two) times daily. ) 60 mL 0   nystatin (NYSTATIN) powder Apply topically to rash 3 times a day 30 g 1   No current facility-administered medications for this visit.     Review of Systems  Constitutional: Negative for chills, diaphoresis, fever, malaise/fatigue and weight loss.       Feels "fine".  No  symptoms.  HENT: Negative.  Negative for congestion, ear discharge, ear pain, nosebleeds, sinus pain and sore throat.   Eyes: Negative.  Negative for blurred vision, double vision, photophobia and pain.  Respiratory: Negative for cough, hemoptysis, sputum production and shortness of breath.   Cardiovascular: Positive for leg swelling. Negative for chest pain, palpitations, orthopnea, claudication and PND.       Hypertension, improved.  Gastrointestinal: Negative.  Negative for abdominal pain, blood in stool, constipation, diarrhea, melena, nausea and vomiting.  Genitourinary: Negative.  Negative for dysuria, frequency, hematuria and urgency.  Musculoskeletal: Positive for joint pain (less at home). Negative for back pain, falls, myalgias and neck pain.  Skin: Negative for itching and rash (beneath breasts resolved with Nystatin).  Neurological: Negative.  Negative for dizziness, tingling, tremors, sensory change, speech change, weakness and headaches.  Endo/Heme/Allergies: Does not bruise/bleed easily.       Diabetes and HYPOthyroidism.  Psychiatric/Behavioral: Negative.  Negative for depression and memory loss. The patient is not nervous/anxious and does not have insomnia.   All other systems reviewed and are negative.  Performance status (ECOG): 1   No visits with results within 3 Day(s) from this visit.  Latest known visit with results is:  Admission on 03/29/2018, Discharged on 04/03/2018  Component Date Value Ref Range Status   Magnesium 03/29/2018 1.9  1.7 - 2.4 mg/dL Final   Performed at [facility], [address] [place], [place] [address]   Phosphorus 03/29/2018 3.8  2.5 - 4.6 mg/dL Final   Performed at [facility], [address] [place], [place] [address]   Calcium, Ionized, Serum 03/30/2018 4.8  4.5 - 5.6 mg/dL Final   Comment: (NOTE) Performed At: [facility] [place], [place] [id number] Rush Farmer MD OM:[id number]      Glucose-Capillary 03/29/2018 152* 70 - 99 mg/dL Final   Comment 1 03/29/2018 Notify RN   Final   Comment 2 03/29/2018 Document in Chart   Final   Sodium 03/31/2018 140  135 - 145 mmol/L Final   Potassium 03/31/2018 4.2  3.5 - 5.1 mmol/L Final   Chloride 03/31/2018 106  98 - 111 mmol/L Final   CO2 03/31/2018 25  22 - 32 mmol/L Final   Glucose, Bld 03/31/2018 140* 70 - 99 mg/dL Final   BUN 03/31/2018 35* 6 - 20 mg/dL Final   Creatinine, Ser 03/31/2018 1.43* 0.44 - 1.00 mg/dL Final   Calcium 03/31/2018 8.0* 8.9 - 10.3 mg/dL Final   GFR calc non Af Amer 03/31/2018 41* >60 mL/min Final   GFR calc Af Amer 03/31/2018 48* >60 mL/min Final   Anion gap 03/31/2018 9  5 - 15 Final   Performed at [facility], [place], [place] [address]   Sodium 04/01/2018 143  135 - 145 mmol/L Final   Potassium 04/01/2018 4.0  3.5 - 5.1 mmol/L Final   Chloride 04/01/2018 106  98 - 111 mmol/L Final   CO2 04/01/2018 30  22 - 32 mmol/L Final   Glucose, Bld 04/01/2018 96  70 - 99 mg/dL Final   BUN 04/01/2018 40* 6 - 20 mg/dL Final   Creatinine, Ser 04/01/2018 1.37* 0.44 - 1.00 mg/dL Final   Calcium 04/01/2018 8.6* 8.9 - 10.3 mg/dL Final   GFR calc non Af Amer 04/01/2018 44* >60 mL/min Final   GFR calc Af Amer 04/01/2018 51* >60 mL/min Final   Anion gap 04/01/2018 7  5 - 15 Final   Performed at [facility], [place], [place] [address]   WBC 04/01/2018 7.4  4.0 - 10.5 K/uL Final   RBC 04/01/2018 4.58  3.87 - 5.11 MIL/uL Final   Hemoglobin 04/01/2018 11.7* 12.0 - 15.0 g/dL Final   HCT 04/01/2018 39.4  36.0 - 46.0 % Final   MCV 04/01/2018 86.0  80.0 - 100.0 fL Final   MCH 04/01/2018 25.5* 26.0 - 34.0 pg Final   MCHC 04/01/2018 29.7* 30.0 - 36.0 g/dL Final   RDW 04/01/2018 15.2  11.5 - 15.5 % Final   Platelets  04/01/2018 180  150 - 400 K/uL Final   nRBC 04/01/2018 0.0  0.0 - 0.2 % Final   Performed at [facility], [address] [place], [place] [address]   Glucose-Capillary 04/01/2018 82  70 - 99 mg/dL Final   Comment 1 04/01/2018 Notify RN   Final   Comment 2 04/01/2018 Document in Chart   Final   Glucose-Capillary 03/31/2018 86  70 - 99 mg/dL Final   Comment 1 03/31/2018 Notify RN   Final   Glucose-Capillary 03/31/2018 93  70 - 99 mg/dL Final   Glucose-Capillary 03/31/2018 110* 70 - 99 mg/dL Final   Glucose-Capillary 03/31/2018 104* 70 - 99 mg/dL Final   Glucose-Capillary 03/30/2018 134* 70 - 99 mg/dL Final   Glucose-Capillary 03/30/2018 148* 70 - 99 mg/dL Final   Glucose-Capillary 03/30/2018 186* 70 - 99 mg/dL Final   Glucose-Capillary 03/30/2018 159* 70 - 99 mg/dL Final   Glucose-Capillary 04/01/2018 128* 70 - 99 mg/dL Final   Comment 1 04/01/2018 Notify RN   Final   Comment 2 04/01/2018 Document in Chart   Final   Glucose-Capillary 04/02/2018 96  70 - 99 mg/dL Final   Glucose-Capillary 04/01/2018 140* 70 - 99 mg/dL Final   Comment 1 04/01/2018 Notify RN   Final   Comment 2 04/01/2018 Document in Chart   Final   Glucose-Capillary 04/01/2018 155* 70 - 99 mg/dL Final   Glucose-Capillary 04/02/2018 149* 70 - 99 mg/dL Final   Sodium 04/03/2018 141  135 - 145 mmol/L Final   Potassium 04/03/2018 4.3  3.5 - 5.1 mmol/L Final   Chloride 04/03/2018 105  98 - 111 mmol/L Final   CO2 04/03/2018 28  22 - 32 mmol/L Final   Glucose, Bld 04/03/2018 93  70 - 99 mg/dL Final   BUN 04/03/2018 47* 6 - 20 mg/dL Final   Creatinine, Ser 04/03/2018 1.37* 0.44 - 1.00 mg/dL Final   Calcium 04/03/2018 8.5* 8.9 - 10.3 mg/dL Final   GFR calc non Af Amer 04/03/2018 44* >60 mL/min Final   GFR calc Af Amer 04/03/2018 51* >60 mL/min Final   Anion gap 04/03/2018 8  5 - 15 Final   Performed at [facility], [address] [place], [place] [address]   Glucose-Capillary 04/03/2018 98  70 - 99 mg/dL Final   Glucose-Capillary 04/02/2018 114* 70 - 99 mg/dL Final   Glucose-Capillary  04/02/2018 117* 70 - 99 mg/dL Final   Comment 1 04/02/2018 Notify RN   Final   Comment 2 04/02/2018 Document in Chart   Final    Assessment:  Nicole Schmidt is a 53 y.o. female with a history of stage I right breast cancer status post wide local incision on 08/12/2012.  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  Sentinel lymph node was negative.  Tumor was ER/PR positive and Her2/neu negative.  Oncotype DX score was 14 (low risk).  She completed breast radiation in 10/2012.  She began tamoxifen in 10/2012, but discontinued it on 10/19/2014 following a submassive PE.  She began Femara on 06/10/2015.   Bilateral diagnostic mammogram on 11/01/2017 revealed no mammographic evidence for malignancy.  CA27.29 has been followed: 21.7 on 04/24/2013, 21.4 on 07/31/2013, 17.9 on 03/09/2014, 20.7 on 01/18/2015, 17.2 on 06/10/2015, 22.5 on 08/13/2015, 16.6 on 11/08/2015, 19.8 on 01/31/2016, 25.2 on 05/29/2016, 19.3 on 11/30/2016, and 22.1 on 05/14/2017.  She had a bilateral submassive pulmonary emboli on 10/18/2014.  Chest CT angiogram revealed submassive PE with right  heart strain.  She received catheter directed lytic therapy at [facility].  Lower extremity duplex was negative for DVT.  She was on Xarelto (discontinued secondary to oral bleeding).  She began Eliquis on 07/08/2015.  Hypercoagulable work-up revealed the following normal studies:  Factor V Leiden, prothrombin gene mutation, lupus anticoagulant panel, anticardiolipin antibodies, protein S total (127%), protein S activity (99%), protein C total (71%), protein C activity (77%), and ATIII activity (82%).  Bone density study on 01/26/2015 was normal with a T score of -0.3.  She has had no menses since 07/2012.  Labs on 01/18/2015 confirmed a post-menopausal status.  She began Femara on 06/10/2015.   BCI testing indicate a 3% (95% CI: 0.8-5.0%) risk of late recurrence, thus a low likelihood of benefit for extended (5 versus  10 years) adjuvant hormonal therapy.   She was noted to have a normocytic anemia on 06/10/2015.  Labs on 07/08/2015 revealed documented iron deficiency.  Ferritin was 25, iron saturation 6%, TIBC 473 (high).  Normal labs included B12, folate, SPEP revealed, LFTs, and guaiac cards x 3.   She is on oral iron with vitamin C.  Ferritin has been followed:  25 on 07/08/2015, 30 on 11/08/2015, 46 on 01/31/2016, 37 on 05/29/2016,  53 on 05/14/2017, and 92 on 11/14/2017.   She was noted to have stage III renal insufficiency on 06/10/2015.  Creatinine was 1.34 (CrCl 45 ml/min) with prior baseline Cr of 0.86 - 0.93. She was seen by nephrology.  Etiology was felt secondary to dehydration.  Echocardiogram on 11/20/2017 demonstrated an LVEF of 55-60%.  Bone density on 11/29/2017 that revealed a normal T-score of -1.0 in the LEFT femoral neck.   She was admitted to [facility] from 11/15/2017 - 11/21/2017 for ARF (creatinine 4.54) secondary to volume depletion, as patient was on both a loop and thiazide diuretic. Patient underwent gentle IV rehydration. Renal function deteriorated to a BUN 62 and creatine 6.3.  She underwent dialysis on 11/28/2017.  Discharge labs revealed a BUN of 46 and creatinine of 2.62.   She was admitted to [facility] from 03/29/2018 - 04/03/2018.  She received Tamiflu for influenza A.  She remained hospitalized secondary to hypoxia.  She did not go home on oxygen.  Symptomatically, she is doing well.  She denies any breast concerns.   Plan: 1.   Stage I RIGHT breast cancer Clinically doing well. No breast concerns.  BCI testing indicate a 3% risk of late recurrence between years 5 and 10. Low likelihood of benefit for extended (5 versus 10 years) adjuvant hormonal therapy. She was on tamoxifen for 24 months. She was OFF therapy for 7 months. Discuss plan to complete Femara at end of this month/beginning of next month to complete 5 years of therapy. Discuss plan for mammogram 11/02/2018 (ordered  by Dr. Bary Castilla). 2.   Renal insufficiency BUN 47 and creatinine 1.37 mg/dL.  Continue close surveillance. 3.   Elevated alkaline phosphatase, resolved   Alkaline phosphatase was 109 (normal) on 03/29/2018. 4.   Iron deficiency anemia Hemoglobin 11.7, hematocrit 39.4, MCV 86 on 04/01/2018. Ferritin was 110 on 03/29/2018. Patient on iron 2 x/day. 5.   History of BILATERAL pulmonary emboli Patient on chronic anticoagulation therapy (apixaban). Patient denies increased bruising or bleeding.   Continue Eliquis. 6.   Recurrent fungal rash Candidal rash beneath breasts managed with Nystatin. 7.   Please obtain Dr De Hollingshead labs drawn 3 weeks ago. 8.   RTC in 2 months for MD assessment including breast exam  and labs (CBC with diff, CMP, ferritin, CA27.29).   I discussed the assessment and treatment plan with the patient.  The patient was provided an opportunity to ask questions and all were answered.  The patient agreed with the plan and demonstrated an understanding of the instructions.  The patient was advised to call back or seek an in person evaluation if the symptoms worsen or if the condition fails to improve as anticipated.  I provided 17 minutes (3:45 PM - 4:02 PM) of non-face-to-face time during this encounter.  I provided these services from the [facility] office.   Lequita Asal, MD, PhD  05/22/2018, 3:45 PM

## 2018-05-22 NOTE — Progress Notes (Signed)
Confirmed name, DOB, and address. Denies any concerns at this time.  

## 2018-05-22 NOTE — Telephone Encounter (Signed)
Left message requesting Labs to be faxed over.

## 2018-06-28 ENCOUNTER — Encounter: Payer: Self-pay | Admitting: Endocrinology

## 2018-07-03 ENCOUNTER — Other Ambulatory Visit: Payer: Self-pay

## 2018-07-03 DIAGNOSIS — I2782 Chronic pulmonary embolism: Secondary | ICD-10-CM

## 2018-07-15 ENCOUNTER — Telehealth: Payer: Self-pay

## 2018-07-15 MED ORDER — APIXABAN 5 MG PO TABS: 5.0000 mg | ORAL_TABLET | Freq: Two times a day (BID) | ORAL | 1 refills | Status: DC

## 2018-07-15 NOTE — Telephone Encounter (Signed)
Eliqusi 5 mg BID # 180 with 1 refill ( 6 months supply.)

## 2018-07-25 ENCOUNTER — Encounter: Payer: Self-pay | Admitting: Hematology and Oncology

## 2018-08-02 ENCOUNTER — Other Ambulatory Visit: Payer: BLUE CROSS/BLUE SHIELD

## 2018-08-02 ENCOUNTER — Ambulatory Visit: Payer: BLUE CROSS/BLUE SHIELD | Admitting: Hematology and Oncology

## 2018-08-13 NOTE — Therapy (Signed)
[place] [facility]IN [facility] [address] [place], [place], [address] Phone: [phone]   Fax:  [phone]  Patient Details  Name: Nicole Schmidt MRN: [id number] Date of Birth: [birth year] Referring Provider:  Lenard Simmer, MD  Encounter Date: 10/04/2017  Patient cancelled this date, chart was opened by Roxana Hires to review info for session, no charges associated with this date.  Note was run to remove chart from open chart list.   Pegi Milazzo 08/13/2018, 9:43 AM  [place], [place], [address] Phone: [phone]   Fax:  [phone]

## 2018-08-28 NOTE — Progress Notes (Signed)
[facility]  [address], [address] [place], [place] [address] Phone: [phone]  Fax: [phone]   Clinic Day:  08/30/2018  Referring physician: Lenard Simmer, MD  Chief Complaint: Nicole Schmidt is a 53 y.o. female with stage I breast cancer, history of submassive pulmonary embolism, and iron deficiency anemia who is evaluated for 3 month assessment.   HPI: The patient was last seen in the medical oncology and hematology clinic via telephone on 05/22/2018. At that time, she was doing well. She denied any breast concerns. BCI testing revealed a low risk of recurrence.  We discussed endocrine therapy x 5 years.  Femara discontinued in late 07/2018.  During the interim, she reports "I'm alright, I reckon." She reports joint pain at work, which improves when she is off work. She has joint stiffness after sitting for a while. She denies any changes after stopping Femara. She reports a nodule in her right arm near her elbow that has been presents for about a week. She is unsure about bruising or trauma to the area.   She has been staying hydrated. She will see Dr. Holley Raring in 09/2018.   She performs occasional breast exams. She denies any breast concerns. She continues on oral iron BID.    Past Medical History:  Diagnosis Date   Anemia    Arthritis    Breast cancer of upper-inner quadrant of right female breast ([place])    Right breast invasive CA and DCIS , 7 mm T1,N0,M0. Er/PR pos, her 2 negative.  Margins 1 mm.   CHF (congestive heart failure) (HCC)    Diabetes mellitus without complication ([place])    Gout    Gout    Hypertension    Menopause    age 19   Personal history of radiation therapy    Pulmonary embolism ([place]) 10/2014   Renal insufficiency    Thyroid goiter     Past Surgical History:  Procedure Laterality Date   BREAST BIOPSY Right 2014   breast ca   BREAST EXCISIONAL BIOPSY Right 07/19/2012   breast ca   BREAST SURGERY  Right 2014   with sentinel node bx subareaolar duct excision   COLONOSCOPY WITH PROPOFOL N/A 01/30/2017   Procedure: COLONOSCOPY WITH PROPOFOL;  Surgeon: Christene Lye, MD;  Location: [facility] ENDOSCOPY;  Service: Endoscopy;  Laterality: N/A;    Family History  Problem Relation Age of Onset   Diabetes Father    Hypertension Father    Parkinson's disease Father    Hypertension Mother    Rheum arthritis Mother    Atrial fibrillation Mother    Heart failure Mother    Colon cancer Cousin 71   Breast cancer Neg Hx     Social History:  reports that she has never smoked. She has never used smokeless tobacco. She reports that she does not drink alcohol or use drugs. Her mother died of a MI.  Her father died in May 27, 2015.  He had Parkinson's disease and dementia.  She is an only child.  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 She has never married. She is alone today.   Allergies: No Known Allergies  Current Medications: Current Outpatient Medications  Medication Sig Dispense Refill   apixaban (ELIQUIS) 5 MG TABS tablet Take 1 tablet (5 mg total) by mouth 2 (two) times daily. 180 tablet 1   Ascorbic Acid (VITAMIN C PO) Take 1  tablet by mouth 2 (two) times daily.      atorvastatin (LIPITOR) 20 MG tablet Take 20 mg by mouth at bedtime.      calcium carbonate (OSCAL) 1500 (600 Ca) MG TABS tablet Take 600 mg of elemental calcium by mouth daily.      Cholecalciferol (VITAMIN D3) 50000 units TABS Take 1 tablet by mouth once a week.     CINNAMON PO Take 1,000 mg by mouth daily.      cloNIDine (CATAPRES) 0.2 MG tablet Take 1 tablet (0.2 mg total) by mouth 2 (two) times daily. (Patient taking differently: Take 0.2 mg by mouth daily. ) 60 tablet 0   Dulaglutide (TRULICITY) 1.5 PQ/3.3AQ SOPN Inject 1.5 mg into the skin every Monday. night     ferrous gluconate (FERGON) 240 (27 FE) MG tablet Take 240 mg by mouth 2  (two) times daily.     folic acid (FOLVITE) 762 MCG tablet Take 800 mcg by mouth daily.      furosemide (LASIX) 20 MG tablet Take 20 mg by mouth daily.     levothyroxine (SYNTHROID, LEVOTHROID) 112 MCG tablet Take 112 mcg daily before breakfast by mouth.     nystatin (MYCOSTATIN) 100000 UNIT/ML suspension Take 5 mLs (500,000 Units total) by mouth 4 (four) times daily. (Patient taking differently: Take 5 mLs by mouth 2 (two) times daily. ) 60 mL 0   nystatin (NYSTATIN) powder Apply topically to rash 3 times a day 30 g 1   ALPRAZolam (XANAX) 0.5 MG tablet Take 0.5 mg by mouth as needed for anxiety.      letrozole (FEMARA) 2.5 MG tablet TAKE 1 TABLET DAILY (Patient not taking: No sig reported) 90 tablet 1   No current facility-administered medications for this visit.     Review of Systems  Constitutional: Negative for chills, diaphoresis, fever, malaise/fatigue and weight loss.       Feels "alright, I reckon."  HENT: Negative.  Negative for congestion, hearing loss, nosebleeds, sinus pain and sore throat.   Eyes: Negative.  Negative for blurred vision, double vision, photophobia and pain.  Respiratory: Negative for cough, hemoptysis, sputum production and shortness of breath.   Cardiovascular: Negative for chest pain, palpitations, orthopnea, claudication, leg swelling and PND.       Hypertension, improved.  Gastrointestinal: Negative.  Negative for abdominal pain, blood in stool, constipation, diarrhea, melena, nausea and vomiting.  Genitourinary: Negative.  Negative for dysuria, frequency, hematuria and urgency.  Musculoskeletal: Positive for joint pain (less at home). Negative for back pain, falls, myalgias and neck pain.       Right arm nodule.  Skin: Negative for itching and rash.  Neurological: Negative.  Negative for dizziness, tingling, tremors, sensory change, speech change, weakness and headaches.  Endo/Heme/Allergies: Does not bruise/bleed easily.       Diabetes and  HYPOthyroidism.  Psychiatric/Behavioral: Negative.  Negative for depression and memory loss. The patient is not nervous/anxious and does not have insomnia.   All other systems reviewed and are negative.  Performance status (ECOG): 1  Vitals Blood pressure (!) 156/78, pulse 67, temperature 98 F (36.7 C), temperature source Tympanic, resp. rate 18, height '5\' 4"'  (1.626 m), weight (!) 332 lb 3.7 oz (150.7 kg), last menstrual period 07/17/2012, SpO2 100 %.   Physical Exam  Constitutional: She is oriented to person, place, and time. She appears well-developed and well-nourished. No distress.  HENT:  Head: Normocephalic and atraumatic.  Mouth/Throat: Oropharynx is clear and moist. No oropharyngeal exudate.  [place]  hair. Mask.  Eyes: Pupils are equal, round, and reactive to light. Conjunctivae and EOM are normal. No scleral icterus.  Neck: Normal range of motion. Neck supple.  Cardiovascular: Normal rate, regular rhythm and normal heart sounds.  No murmur heard. Pulmonary/Chest: Effort normal and breath sounds normal. No respiratory distress. She has no wheezes. Right breast exhibits no inverted nipple, no mass, no nipple discharge, no skin change (post-op and post-radiation changes with edema) and no tenderness. Left breast exhibits no inverted nipple, no mass, no nipple discharge, no skin change and no tenderness.  Abdominal: Soft. Bowel sounds are normal. She exhibits no distension. There is no abdominal tenderness.  Musculoskeletal: Normal range of motion.        General: No edema.  Lymphadenopathy:    She has no cervical adenopathy.    She has no axillary adenopathy.       Right: No supraclavicular adenopathy present.       Left: No supraclavicular adenopathy present.  Neurological: She is alert and oriented to person, place, and time.  Skin: Skin is warm and dry. She is not diaphoretic. No erythema.  Small nodule in inner right arm, near elbow.  Psychiatric: She has a normal mood and  affect. Her behavior is normal. Judgment and thought content normal.  Nursing note and vitals reviewed.   Appointment on 08/30/2018  Component Date Value Ref Range Status   Sodium 08/30/2018 141  135 - 145 mmol/L Final   Potassium 08/30/2018 3.9  3.5 - 5.1 mmol/L Final   Chloride 08/30/2018 103  98 - 111 mmol/L Final   CO2 08/30/2018 26  22 - 32 mmol/L Final   Glucose, Bld 08/30/2018 162* 70 - 99 mg/dL Final   BUN 08/30/2018 29* 6 - 20 mg/dL Final   Creatinine, Ser 08/30/2018 1.38* 0.44 - 1.00 mg/dL Final   Calcium 08/30/2018 9.1  8.9 - 10.3 mg/dL Final   Total Protein 08/30/2018 7.3  6.5 - 8.1 g/dL Final   Albumin 08/30/2018 3.6  3.5 - 5.0 g/dL Final   AST 08/30/2018 21  15 - 41 U/L Final   ALT 08/30/2018 16  0 - 44 U/L Final   Alkaline Phosphatase 08/30/2018 156* 38 - 126 U/L Final   Total Bilirubin 08/30/2018 0.7  0.3 - 1.2 mg/dL Final   GFR calc non Af Amer 08/30/2018 43* >60 mL/min Final   GFR calc Af Amer 08/30/2018 50* >60 mL/min Final   Anion gap 08/30/2018 12  5 - 15 Final   Performed at [facility], [address] [place], [place] [address]   WBC 08/30/2018 8.3  4.0 - 10.5 K/uL Final   RBC 08/30/2018 4.37  3.87 - 5.11 MIL/uL Final   Hemoglobin 08/30/2018 12.9  12.0 - 15.0 g/dL Final   HCT 08/30/2018 40.9  36.0 - 46.0 % Final   MCV 08/30/2018 93.6  80.0 - 100.0 fL Final   MCH 08/30/2018 29.5  26.0 - 34.0 pg Final   MCHC 08/30/2018 31.5  30.0 - 36.0 g/dL Final   RDW 08/30/2018 13.2  11.5 - 15.5 % Final   Platelets 08/30/2018 218  150 - 400 K/uL Final   nRBC 08/30/2018 0.0  0.0 - 0.2 % Final   Neutrophils Relative % 08/30/2018 68  % Final   Neutro Abs 08/30/2018 5.5  1.7 - 7.7 K/uL Final   Lymphocytes Relative 08/30/2018 20  % Final   Lymphs Abs 08/30/2018 1.7  0.7 - 4.0 K/uL Final   Monocytes Relative  08/30/2018 8  % Final   Monocytes Absolute 08/30/2018 0.7  0.1 - 1.0 K/uL Final   Eosinophils Relative 08/30/2018 4  %  Final   Eosinophils Absolute 08/30/2018 0.4  0.0 - 0.5 K/uL Final   Basophils Relative 08/30/2018 0  % Final   Basophils Absolute 08/30/2018 0.0  0.0 - 0.1 K/uL Final   Immature Granulocytes 08/30/2018 0  % Final   Abs Immature Granulocytes 08/30/2018 0.03  0.00 - 0.07 K/uL Final   Performed at [facility], [address], [place], [place] [address]    Assessment:  Nicole Schmidt is a 53 y.o. female with a history of stage I right breast cancer status post wide local incision on 08/12/2012.  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  Sentinel lymph node was negative.  Tumor was ER/PR positive and Her2/neu negative.  Oncotype DX score was 14 (low risk).  She completed breast radiation in 10/2012.  She began tamoxifen in 10/2012, but discontinued it on 10/19/2014 following a submassive PE.  She was on  Femara from 06/10/2015 - late 07/2018.   Bilateral diagnostic mammogram on 11/01/2017 revealed no mammographic evidence for malignancy.  CA27.29 has been followed: 21.7 on 04/24/2013, 21.4 on 07/31/2013, 17.9 on 03/09/2014, 20.7 on 01/18/2015, 17.2 on 06/10/2015, 22.5 on 08/13/2015, 16.6 on 11/08/2015, 19.8 on 01/31/2016, 25.2 on 05/29/2016, 19.3 on 11/30/2016, 22.1 on 05/14/2017, and 29.8 on 08/30/2018.  She had a bilateral submassive pulmonary emboli on 10/18/2014.  Chest CT angiogram revealed submassive PE with right heart strain.  She received catheter directed lytic therapy at [facility].  Lower extremity duplex was negative for DVT.  She was on Xarelto (discontinued secondary to oral bleeding).  She began Eliquis on 07/08/2015.  Hypercoagulable work-up revealed the following normal studies:  Factor V Leiden, prothrombin gene mutation, lupus anticoagulant panel, anticardiolipin antibodies, protein S total (127%), protein S activity (99%), protein C total (71%), protein C activity (77%), and ATIII activity (82%).  Bone density study on 01/26/2015  was normal with a T score of -0.3.  She has had no menses since 07/2012.  Labs on 01/18/2015 confirmed a post-menopausal status.  She began Femara on 06/10/2015.   BCI testing indicate a 3% (95% CI: 0.8-5.0%) risk of late recurrence, thus a low likelihood of benefit for extended (5 versus 10 years) adjuvant hormonal therapy.   She was noted to have a normocytic anemia on 06/10/2015.  Labs on 07/08/2015 revealed documented iron deficiency.  Ferritin was 25, iron saturation 6%, TIBC 473 (high).  Normal labs included B12, folate, SPEP revealed, LFTs, and guaiac cards x 3.   She is on oral iron with vitamin C.  Ferritin has been followed:  25 on 07/08/2015, 30 on 11/08/2015, 46 on 01/31/2016, 37 on 05/29/2016,  53 on 05/14/2017, 92 on 11/14/2017, and 115 on 08/30/2018.   She was noted to have stage III renal insufficiency on 06/10/2015.  Creatinine was 1.34 (CrCl 45 ml/min) with prior baseline Cr of 0.86 - 0.93. She was seen by nephrology.  Etiology was felt secondary to dehydration.  Echocardiogram on 11/20/2017 demonstrated an LVEF of 55-60%.  Bone density on 11/29/2017 that revealed a normal T-score of -1.0 in the LEFT femoral neck.   She was admitted to [facility] from 11/15/2017 - 11/21/2017 for ARF (creatinine 4.54) secondary to volume depletion, as patient was on both a loop and thiazide diuretic. Patient underwent gentle IV rehydration. Renal function deteriorated to a BUN 62 and creatine  6.3.  She underwent dialysis on 11/28/2017.  Discharge labs revealed a BUN of 46 and creatinine of 2.62.   She was admitted to [facility] from 03/29/2018 - 04/03/2018.  She received Tamiflu for influenza A.  She remained hospitalized secondary to hypoxia.  She did not go home on oxygen.  Symptomatically, she is doing well.  She denies any breast concerns.  She has a new nodule on her right arm.  CA 27.29 is normal.  Plan: 1.  Labs today: CBC with diff, CMP, ferritin, CA27.29. 2. Stage I RIGHT breast  cancer Clinically, she continues to do well.   Exam is stable.   BCI testing revealed a low risk of late recurrence.   She completed 5 years of endocrine therapy in late 07/2018.   Schedule bilateral mammogram on 10/23/2018. 3.   Renal insufficiency Creatinine 1.38.  Continue to monitor. 4.   Elevated alkaline phosphatase, fluctuating.  Etiology unclear.    Alkaline phosphatase was 109 (normal) on 03/29/2018.   Alkaline phosphatase is 156 today.  Check alkaline phosphatase in 4 months. 5.   Iron deficiency anemia Hemoglobin 12.9, hematocrit 40.9, MCV 93.6. Ferritin 115. Discontinue oral iron. Check CBC in 4 months off iron. 6.   History of BILATERAL pulmonary emboli Patient remains on chronic anticoagulation. Continue Eliquis. 7.   Recurrent fungal rash No current evidence of candidal rash under breasts. Topical nystatin as needed. 8.   Right forearm nodule  Etiology unknown.  Lesion does not appear to be a cyst.  Possible hematoma due to trauma or fat necrosis.  Right forearm ultrasound. 9.   RTC in 4 months for labs (CBC with diff, CMP). 10.   RTC in 6 months for MD assessment, labs (CBC with diff, CA27.29), review of mammogram.  I discussed the assessment and treatment plan with the patient.  The patient was provided an opportunity to ask questions and all were answered.  The patient agreed with the plan and demonstrated an understanding of the instructions.  The patient was advised to call back if the symptoms worsen or if the condition fails to improve as anticipated.  I provided 15 minutes of face-to-face time during this this encounter and > 50% was spent counseling as documented under my assessment and plan.    Lequita Asal, MD, PhD    08/30/2018, 3:52 PM  I, Molly Dorshimer, am acting as Education administrator for [employer]. Mike Gip, MD, PhD.  I, Wadie Mattie C. Mike Gip, MD, have reviewed the above documentation for accuracy and completeness, and I agree with the above.

## 2018-08-29 ENCOUNTER — Other Ambulatory Visit: Payer: Self-pay

## 2018-08-30 ENCOUNTER — Ambulatory Visit (HOSPITAL_BASED_OUTPATIENT_CLINIC_OR_DEPARTMENT_OTHER): Payer: BC Managed Care – PPO | Admitting: Hematology and Oncology

## 2018-08-30 ENCOUNTER — Inpatient Hospital Stay: Payer: BC Managed Care – PPO | Attending: Hematology and Oncology

## 2018-08-30 ENCOUNTER — Other Ambulatory Visit: Payer: Self-pay

## 2018-08-30 ENCOUNTER — Encounter: Payer: Self-pay | Admitting: Hematology and Oncology

## 2018-08-30 VITALS — BP 156/78 | HR 67 | Temp 98.0°F | Resp 18 | Ht 64.0 in | Wt 332.2 lb

## 2018-08-30 DIAGNOSIS — Z7901 Long term (current) use of anticoagulants: Secondary | ICD-10-CM

## 2018-08-30 DIAGNOSIS — E119 Type 2 diabetes mellitus without complications: Secondary | ICD-10-CM | POA: Diagnosis not present

## 2018-08-30 DIAGNOSIS — Z923 Personal history of irradiation: Secondary | ICD-10-CM | POA: Insufficient documentation

## 2018-08-30 DIAGNOSIS — M2550 Pain in unspecified joint: Secondary | ICD-10-CM

## 2018-08-30 DIAGNOSIS — Z800 Family history of malignant neoplasm of digestive organs: Secondary | ICD-10-CM | POA: Diagnosis not present

## 2018-08-30 DIAGNOSIS — B49 Unspecified mycosis: Secondary | ICD-10-CM | POA: Diagnosis not present

## 2018-08-30 DIAGNOSIS — Z79899 Other long term (current) drug therapy: Secondary | ICD-10-CM

## 2018-08-30 DIAGNOSIS — R748 Abnormal levels of other serum enzymes: Secondary | ICD-10-CM | POA: Diagnosis not present

## 2018-08-30 DIAGNOSIS — I2782 Chronic pulmonary embolism: Secondary | ICD-10-CM

## 2018-08-30 DIAGNOSIS — Z86711 Personal history of pulmonary embolism: Secondary | ICD-10-CM | POA: Insufficient documentation

## 2018-08-30 DIAGNOSIS — Z8261 Family history of arthritis: Secondary | ICD-10-CM

## 2018-08-30 DIAGNOSIS — D509 Iron deficiency anemia, unspecified: Secondary | ICD-10-CM | POA: Diagnosis not present

## 2018-08-30 DIAGNOSIS — Z170 Estrogen receptor positive status [ER+]: Secondary | ICD-10-CM | POA: Diagnosis not present

## 2018-08-30 DIAGNOSIS — R2231 Localized swelling, mass and lump, right upper limb: Secondary | ICD-10-CM | POA: Diagnosis not present

## 2018-08-30 DIAGNOSIS — I110 Hypertensive heart disease with heart failure: Secondary | ICD-10-CM | POA: Insufficient documentation

## 2018-08-30 DIAGNOSIS — N289 Disorder of kidney and ureter, unspecified: Secondary | ICD-10-CM | POA: Insufficient documentation

## 2018-08-30 DIAGNOSIS — Z833 Family history of diabetes mellitus: Secondary | ICD-10-CM | POA: Diagnosis not present

## 2018-08-30 DIAGNOSIS — Z8249 Family history of ischemic heart disease and other diseases of the circulatory system: Secondary | ICD-10-CM | POA: Insufficient documentation

## 2018-08-30 DIAGNOSIS — C50211 Malignant neoplasm of upper-inner quadrant of right female breast: Secondary | ICD-10-CM | POA: Diagnosis present

## 2018-08-30 DIAGNOSIS — Z820 Family history of epilepsy and other diseases of the nervous system: Secondary | ICD-10-CM | POA: Insufficient documentation

## 2018-08-30 DIAGNOSIS — C50911 Malignant neoplasm of unspecified site of right female breast: Secondary | ICD-10-CM

## 2018-08-30 DIAGNOSIS — I509 Heart failure, unspecified: Secondary | ICD-10-CM | POA: Diagnosis not present

## 2018-08-30 LAB — CBC WITH DIFFERENTIAL/PLATELET
Abs Immature Granulocytes: 0.03 10*3/uL (ref 0.00–0.07)
Basophils Absolute: 0 10*3/uL (ref 0.0–0.1)
Basophils Relative: 0 %
Eosinophils Absolute: 0.4 10*3/uL (ref 0.0–0.5)
Eosinophils Relative: 4 %
HCT: 40.9 % (ref 36.0–46.0)
Hemoglobin: 12.9 g/dL (ref 12.0–15.0)
Immature Granulocytes: 0 %
Lymphocytes Relative: 20 %
Lymphs Abs: 1.7 10*3/uL (ref 0.7–4.0)
MCH: 29.5 pg (ref 26.0–34.0)
MCHC: 31.5 g/dL (ref 30.0–36.0)
MCV: 93.6 fL (ref 80.0–100.0)
Monocytes Absolute: 0.7 10*3/uL (ref 0.1–1.0)
Monocytes Relative: 8 %
Neutro Abs: 5.5 10*3/uL (ref 1.7–7.7)
Neutrophils Relative %: 68 %
Platelets: 218 10*3/uL (ref 150–400)
RBC: 4.37 MIL/uL (ref 3.87–5.11)
RDW: 13.2 % (ref 11.5–15.5)
WBC: 8.3 10*3/uL (ref 4.0–10.5)
nRBC: 0 % (ref 0.0–0.2)

## 2018-08-30 LAB — COMPREHENSIVE METABOLIC PANEL
ALT: 16 U/L (ref 0–44)
AST: 21 U/L (ref 15–41)
Albumin: 3.6 g/dL (ref 3.5–5.0)
Alkaline Phosphatase: 156 U/L — ABNORMAL HIGH (ref 38–126)
Anion gap: 12 (ref 5–15)
BUN: 29 mg/dL — ABNORMAL HIGH (ref 6–20)
CO2: 26 mmol/L (ref 22–32)
Calcium: 9.1 mg/dL (ref 8.9–10.3)
Chloride: 103 mmol/L (ref 98–111)
Creatinine, Ser: 1.38 mg/dL — ABNORMAL HIGH (ref 0.44–1.00)
GFR calc Af Amer: 50 mL/min — ABNORMAL LOW (ref >60)
GFR calc non Af Amer: 43 mL/min — ABNORMAL LOW (ref >60)
Glucose, Bld: 162 mg/dL — ABNORMAL HIGH (ref 70–99)
Potassium: 3.9 mmol/L (ref 3.5–5.1)
Sodium: 141 mmol/L (ref 135–145)
Total Bilirubin: 0.7 mg/dL (ref 0.3–1.2)
Total Protein: 7.3 g/dL (ref 6.5–8.1)

## 2018-08-30 LAB — FERRITIN: Ferritin: 115 ng/mL (ref 11–307)

## 2018-08-30 NOTE — Progress Notes (Signed)
No new changes noted today 

## 2018-08-31 LAB — CANCER ANTIGEN 27.29: CA 27.29: 29.8 U/mL (ref 0.0–38.6)

## 2018-09-02 ENCOUNTER — Ambulatory Visit: Payer: BLUE CROSS/BLUE SHIELD | Admitting: Family

## 2018-09-03 ENCOUNTER — Encounter: Payer: Self-pay | Admitting: General Surgery

## 2018-09-05 ENCOUNTER — Other Ambulatory Visit: Payer: Self-pay

## 2018-09-05 ENCOUNTER — Ambulatory Visit
Admission: RE | Admit: 2018-09-05 | Discharge: 2018-09-05 | Disposition: A | Discharge status: Home / Self Care | Payer: BC Managed Care – PPO | Source: Ambulatory Visit | Attending: Hematology and Oncology | Admitting: Hematology and Oncology

## 2018-09-05 DIAGNOSIS — R2231 Localized swelling, mass and lump, right upper limb: Secondary | ICD-10-CM

## 2018-09-17 ENCOUNTER — Other Ambulatory Visit: Payer: Self-pay

## 2018-09-17 DIAGNOSIS — Z1231 Encounter for screening mammogram for malignant neoplasm of breast: Secondary | ICD-10-CM

## 2018-09-26 NOTE — Progress Notes (Signed)
Patient ID: Nicole Schmidt, female    DOB: January 20, 1966, 53 y.o.   MRN: [id number]  HPI  Nicole Schmidt is a 53 y/o female with a history of breast cancer, DM, HTN, CKD, thyroid disease, anemia, gout, pulmonary embolus and chronic heart failure.   Echo report from 11/20/17 reviewed and showed an EF of 55-60%.  Admitted 03/29/2018 due to acute hypoxic respiratory failure secondary to influenza. Nebulizer treatments done. Discharged after 5 days.   She presents today for a follow-up visit with a chief complaint of minimal shortness of breath upon moderate exertion. She describes this as chronic in nature having been present for several years. She has associated pedal edema, difficulty sleeping and slight weight along with this. She denies any abdominal distention, palpitations, chest pain, dizziness, cough or fatigue.   Past Medical History:  Diagnosis Date  . Anemia   . Arthritis   . Breast cancer of upper-inner quadrant of right female breast ([place])    Right breast invasive CA and DCIS , 7 mm T1,N0,M0. Er/PR pos, her 2 negative.  Margins 1 mm.  . CHF (congestive heart failure) ([place])   . Diabetes mellitus without complication ([place])   . Gout   . Gout   . Hypertension   . Menopause    age 42  . Personal history of radiation therapy   . Pulmonary embolism ([place]) 10/2014  . Renal insufficiency   . Thyroid goiter    Past Surgical History:  Procedure Laterality Date  . BREAST BIOPSY Right 2014   breast ca  . BREAST EXCISIONAL BIOPSY Right 07/19/2012   breast ca  . BREAST SURGERY Right 2014   with sentinel node bx subareaolar duct excision  . COLONOSCOPY WITH PROPOFOL N/A 01/30/2017   Procedure: COLONOSCOPY WITH PROPOFOL;  Surgeon: Christene Lye, MD;  Location: [facility] ENDOSCOPY;  Service: Endoscopy;  Laterality: N/A;   Family History  Problem Relation Age of Onset  . Diabetes Father   . Hypertension Father   . Parkinson's disease Father   . Hypertension Mother   . Rheum arthritis  Mother   . Atrial fibrillation Mother   . Heart failure Mother   . Colon cancer Cousin 58  . Breast cancer Neg Hx    Social History   Tobacco Use  . Smoking status: Never Smoker  . Smokeless tobacco: Never Used  Substance Use Topics  . Alcohol use: No    Alcohol/week: 0.0 standard drinks   No Known Allergies  Prior to Admission medications   Medication Sig Start Date End Date Taking? Authorizing Provider  amLODipine (NORVASC) 5 MG tablet Take 5 mg by mouth 2 (two) times daily. 09/18/18  Yes [provider]  apixaban (ELIQUIS) 5 MG TABS tablet Take 1 tablet (5 mg total) by mouth 2 (two) times daily. 07/15/18  Yes Corcoran, Drue Second, MD  Ascorbic Acid (VITAMIN C PO) Take 1 tablet by mouth 2 (two) times daily.    Yes [provider]  atorvastatin (LIPITOR) 20 MG tablet Take 20 mg by mouth at bedtime.    Yes [provider]  calcium carbonate (OSCAL) 1500 (600 Ca) MG TABS tablet Take 600 mg of elemental calcium by mouth daily.    Yes [provider]  Cholecalciferol (VITAMIN D3) 50000 units TABS Take 1 tablet by mouth once a week.   Yes [provider]  CINNAMON PO Take 1,000 mg by mouth daily.    Yes [provider]  cloNIDine (CATAPRES) 0.2 MG tablet  Take 1 tablet (0.2 mg total) by mouth 2 (two) times daily. Patient taking differently: Take 0.2 mg by mouth daily.  04/03/18  Yes Wieting, Richard, MD  Dulaglutide (TRULICITY) 1.5 XF/8.1WE SOPN Inject 1.5 mg into the skin every Monday. night   Yes [provider]  folic acid (FOLVITE) 993 MCG tablet Take 800 mcg by mouth daily.    Yes [provider]  furosemide (LASIX) 20 MG tablet Take 20 mg by mouth daily.   Yes [provider]  levothyroxine (SYNTHROID, LEVOTHROID) 112 MCG tablet Take 112 mcg daily before breakfast by mouth.   Yes [provider]  ALPRAZolam Duanne Moron) 0.5 MG tablet Take 0.5 mg by mouth as needed for anxiety.     [provider]     Review of Systems  Constitutional: Negative for appetite change and fatigue.  HENT: Negative for congestion, postnasal drip and sore throat.   Eyes: Negative.   Respiratory: Positive for shortness of breath (with moderate exertion). Negative for cough.   Cardiovascular: Positive for leg swelling. Negative for chest pain and palpitations.  Gastrointestinal: Negative for abdominal distention and abdominal pain.  Endocrine: Negative.   Genitourinary: Negative.   Musculoskeletal: Positive for arthralgias (feet at times). Negative for back pain.  Skin: Negative.   Allergic/Immunologic: Negative.   Neurological: Negative for dizziness and light-headedness.  Hematological: Negative for adenopathy. Does not bruise/bleed easily.  Psychiatric/Behavioral: Positive for sleep disturbance (sleeping better since back at work; sleeping on wedge). Negative for dysphoric mood. The patient is not nervous/anxious.    Vitals:   09/27/18 1246  BP: 122/78  Pulse: 77  Resp: 18  SpO2: 92%  Weight: (!) 328 lb 2 oz (148.8 kg)  Height: 5\' 4"  (1.626 m)   Wt Readings from Last 3 Encounters:  09/27/18 (!) 328 lb 2 oz (148.8 kg)  08/30/18 (!) 332 lb 3.7 oz (150.7 kg)  04/03/18 (!) 317 lb 1.6 oz (143.8 kg)   Lab Results  Component Value Date   CREATININE 1.38 (H) 08/30/2018   CREATININE 1.37 (H) 04/03/2018   CREATININE 1.37 (H) 04/01/2018    Physical Exam Vitals signs and nursing note reviewed.  Constitutional:      Appearance: She is well-developed.  HENT:     Head: Normocephalic and atraumatic.  Cardiovascular:     Rate and Rhythm: Normal rate and regular rhythm.  Pulmonary:     Effort: Pulmonary effort is normal.     Breath sounds: Normal breath sounds. No wheezing or rales.  Abdominal:     Palpations: Abdomen is soft.     Tenderness: There is no abdominal tenderness.  Musculoskeletal:     Right lower leg: She exhibits no tenderness. Edema (1+ pitting) present.     Left lower leg: She  exhibits no tenderness. Edema (1+ pitting) present.     Comments: (had lymph node removal 2014 in right arm)  Skin:    General: Skin is warm and dry.  Neurological:     General: No focal deficit present.     Mental Status: She is alert and oriented to person, place, and time.  Psychiatric:        Mood and Affect: Mood normal.        Behavior: Behavior normal.   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 isn't doing as much walking at work as she used to  2: HTN- - BP looks good today - BMP from 08/30/2018 reviewed and showed sodium 141, potassium 3.9, creatinine 1.38 and GFR 43 - follows with PCP (Moray[place]) & saw him today   3: CKD-  - follows closely with nephrology Holley Raring) and sees him August 2020  4: Lymphedema- - stage 2 - trying to elevate her legs when she's not at work with minimal reduction of edema - has compression socks and has been trying to wear them but says that they hurt her leg and cut into the upper part of her leg - she's got zipper compression socks ordered and will try wearing those - limited in her ability to exercise due to her work schedule  - consider lymphapress compression boots if edema persists  Patient did not bring her medications nor a list. Each medication was verbally reviewed with the patient and she was encouraged to bring the bottles to every visit to confirm accuracy of list.  Return in 4 months or sooner for any questions/problems before then.

## 2018-09-27 ENCOUNTER — Ambulatory Visit: Payer: BC Managed Care – PPO | Attending: Family | Admitting: Family

## 2018-09-27 ENCOUNTER — Encounter: Payer: Self-pay | Admitting: Family

## 2018-09-27 ENCOUNTER — Other Ambulatory Visit: Payer: Self-pay

## 2018-09-27 VITALS — BP 122/78 | HR 77 | Resp 18 | Ht 64.0 in | Wt 328.1 lb

## 2018-09-27 DIAGNOSIS — Z853 Personal history of malignant neoplasm of breast: Secondary | ICD-10-CM | POA: Diagnosis not present

## 2018-09-27 DIAGNOSIS — E079 Disorder of thyroid, unspecified: Secondary | ICD-10-CM | POA: Insufficient documentation

## 2018-09-27 DIAGNOSIS — Z8261 Family history of arthritis: Secondary | ICD-10-CM | POA: Insufficient documentation

## 2018-09-27 DIAGNOSIS — Z8249 Family history of ischemic heart disease and other diseases of the circulatory system: Secondary | ICD-10-CM | POA: Diagnosis not present

## 2018-09-27 DIAGNOSIS — Z79899 Other long term (current) drug therapy: Secondary | ICD-10-CM | POA: Diagnosis not present

## 2018-09-27 DIAGNOSIS — Z7901 Long term (current) use of anticoagulants: Secondary | ICD-10-CM | POA: Diagnosis not present

## 2018-09-27 DIAGNOSIS — I130 Hypertensive heart and chronic kidney disease with heart failure and stage 1 through stage 4 chronic kidney disease, or unspecified chronic kidney disease: Secondary | ICD-10-CM | POA: Insufficient documentation

## 2018-09-27 DIAGNOSIS — M109 Gout, unspecified: Secondary | ICD-10-CM | POA: Insufficient documentation

## 2018-09-27 DIAGNOSIS — M1990 Unspecified osteoarthritis, unspecified site: Secondary | ICD-10-CM | POA: Insufficient documentation

## 2018-09-27 DIAGNOSIS — Z923 Personal history of irradiation: Secondary | ICD-10-CM | POA: Diagnosis not present

## 2018-09-27 DIAGNOSIS — Z86711 Personal history of pulmonary embolism: Secondary | ICD-10-CM | POA: Insufficient documentation

## 2018-09-27 DIAGNOSIS — Z79890 Hormone replacement therapy (postmenopausal): Secondary | ICD-10-CM | POA: Diagnosis not present

## 2018-09-27 DIAGNOSIS — I890 Lymphedema, not elsewhere classified: Secondary | ICD-10-CM

## 2018-09-27 DIAGNOSIS — Z833 Family history of diabetes mellitus: Secondary | ICD-10-CM | POA: Insufficient documentation

## 2018-09-27 DIAGNOSIS — I10 Essential (primary) hypertension: Secondary | ICD-10-CM

## 2018-09-27 DIAGNOSIS — N189 Chronic kidney disease, unspecified: Secondary | ICD-10-CM | POA: Insufficient documentation

## 2018-09-27 DIAGNOSIS — E1122 Type 2 diabetes mellitus with diabetic chronic kidney disease: Secondary | ICD-10-CM | POA: Insufficient documentation

## 2018-09-27 DIAGNOSIS — I5032 Chronic diastolic (congestive) heart failure: Secondary | ICD-10-CM | POA: Insufficient documentation

## 2018-09-27 DIAGNOSIS — N183 Chronic kidney disease, stage 3 unspecified: Secondary | ICD-10-CM

## 2018-09-27 DIAGNOSIS — I509 Heart failure, unspecified: Secondary | ICD-10-CM | POA: Diagnosis present

## 2018-09-27 NOTE — Patient Instructions (Signed)
Continue weighing daily and call for an overnight weight gain of > 2 pounds or a weekly weight gain of >5 pounds. 

## 2018-10-03 ENCOUNTER — Other Ambulatory Visit: Payer: Self-pay

## 2018-10-03 DIAGNOSIS — Z20822 Contact with and (suspected) exposure to covid-19: Secondary | ICD-10-CM

## 2018-10-04 LAB — NOVEL CORONAVIRUS, NAA: SARS-CoV-2, NAA: NOT DETECTED

## 2018-10-17 ENCOUNTER — Other Ambulatory Visit: Payer: Self-pay

## 2018-10-17 DIAGNOSIS — J440 Chronic obstructive pulmonary disease with acute lower respiratory infection: Secondary | ICD-10-CM | POA: Diagnosis present

## 2018-10-17 DIAGNOSIS — R6520 Severe sepsis without septic shock: Secondary | ICD-10-CM | POA: Diagnosis present

## 2018-10-17 DIAGNOSIS — I5033 Acute on chronic diastolic (congestive) heart failure: Secondary | ICD-10-CM | POA: Diagnosis present

## 2018-10-17 DIAGNOSIS — I248 Other forms of acute ischemic heart disease: Secondary | ICD-10-CM | POA: Diagnosis present

## 2018-10-17 DIAGNOSIS — Z79899 Other long term (current) drug therapy: Secondary | ICD-10-CM

## 2018-10-17 DIAGNOSIS — Z923 Personal history of irradiation: Secondary | ICD-10-CM

## 2018-10-17 DIAGNOSIS — Z8261 Family history of arthritis: Secondary | ICD-10-CM

## 2018-10-17 DIAGNOSIS — Z86711 Personal history of pulmonary embolism: Secondary | ICD-10-CM

## 2018-10-17 DIAGNOSIS — Z7951 Long term (current) use of inhaled steroids: Secondary | ICD-10-CM

## 2018-10-17 DIAGNOSIS — J189 Pneumonia, unspecified organism: Secondary | ICD-10-CM | POA: Diagnosis present

## 2018-10-17 DIAGNOSIS — E785 Hyperlipidemia, unspecified: Secondary | ICD-10-CM | POA: Diagnosis present

## 2018-10-17 DIAGNOSIS — N179 Acute kidney failure, unspecified: Secondary | ICD-10-CM | POA: Diagnosis present

## 2018-10-17 DIAGNOSIS — Z8249 Family history of ischemic heart disease and other diseases of the circulatory system: Secondary | ICD-10-CM

## 2018-10-17 DIAGNOSIS — A419 Sepsis, unspecified organism: Principal | ICD-10-CM | POA: Diagnosis present

## 2018-10-17 DIAGNOSIS — E039 Hypothyroidism, unspecified: Secondary | ICD-10-CM | POA: Diagnosis present

## 2018-10-17 DIAGNOSIS — Z7952 Long term (current) use of systemic steroids: Secondary | ICD-10-CM

## 2018-10-17 DIAGNOSIS — I482 Chronic atrial fibrillation, unspecified: Secondary | ICD-10-CM | POA: Diagnosis present

## 2018-10-17 DIAGNOSIS — J45901 Unspecified asthma with (acute) exacerbation: Secondary | ICD-10-CM | POA: Diagnosis present

## 2018-10-17 DIAGNOSIS — N183 Chronic kidney disease, stage 3 (moderate): Secondary | ICD-10-CM | POA: Diagnosis present

## 2018-10-17 DIAGNOSIS — D631 Anemia in chronic kidney disease: Secondary | ICD-10-CM | POA: Diagnosis present

## 2018-10-17 DIAGNOSIS — Z20828 Contact with and (suspected) exposure to other viral communicable diseases: Secondary | ICD-10-CM | POA: Diagnosis present

## 2018-10-17 DIAGNOSIS — M109 Gout, unspecified: Secondary | ICD-10-CM | POA: Diagnosis present

## 2018-10-17 DIAGNOSIS — I130 Hypertensive heart and chronic kidney disease with heart failure and stage 1 through stage 4 chronic kidney disease, or unspecified chronic kidney disease: Secondary | ICD-10-CM | POA: Diagnosis present

## 2018-10-17 DIAGNOSIS — Z853 Personal history of malignant neoplasm of breast: Secondary | ICD-10-CM

## 2018-10-17 DIAGNOSIS — E1122 Type 2 diabetes mellitus with diabetic chronic kidney disease: Secondary | ICD-10-CM | POA: Diagnosis present

## 2018-10-17 DIAGNOSIS — Z7901 Long term (current) use of anticoagulants: Secondary | ICD-10-CM

## 2018-10-17 DIAGNOSIS — Z833 Family history of diabetes mellitus: Secondary | ICD-10-CM

## 2018-10-17 DIAGNOSIS — R0602 Shortness of breath: Secondary | ICD-10-CM | POA: Diagnosis not present

## 2018-10-17 DIAGNOSIS — Z79890 Hormone replacement therapy (postmenopausal): Secondary | ICD-10-CM

## 2018-10-17 DIAGNOSIS — Z820 Family history of epilepsy and other diseases of the nervous system: Secondary | ICD-10-CM

## 2018-10-17 DIAGNOSIS — Z800 Family history of malignant neoplasm of digestive organs: Secondary | ICD-10-CM

## 2018-10-17 LAB — BASIC METABOLIC PANEL
Anion gap: 11 (ref 5–15)
BUN: 89 mg/dL — ABNORMAL HIGH (ref 6–20)
CO2: 23 mmol/L (ref 22–32)
Calcium: 8.5 mg/dL — ABNORMAL LOW (ref 8.9–10.3)
Chloride: 107 mmol/L (ref 98–111)
Creatinine, Ser: 2.3 mg/dL — ABNORMAL HIGH (ref 0.44–1.00)
GFR calc Af Amer: 27 mL/min — ABNORMAL LOW (ref >60)
GFR calc non Af Amer: 23 mL/min — ABNORMAL LOW (ref >60)
Glucose, Bld: 218 mg/dL — ABNORMAL HIGH (ref 70–99)
Potassium: 4 mmol/L (ref 3.5–5.1)
Sodium: 141 mmol/L (ref 135–145)

## 2018-10-17 LAB — CBC
HCT: 44.9 % (ref 36.0–46.0)
Hemoglobin: 14.3 g/dL (ref 12.0–15.0)
MCH: 28.4 pg (ref 26.0–34.0)
MCHC: 31.8 g/dL (ref 30.0–36.0)
MCV: 89.1 fL (ref 80.0–100.0)
Platelets: 265 10*3/uL (ref 150–400)
RBC: 5.04 MIL/uL (ref 3.87–5.11)
RDW: 13.3 % (ref 11.5–15.5)
WBC: 18.8 10*3/uL — ABNORMAL HIGH (ref 4.0–10.5)
nRBC: 0 % (ref 0.0–0.2)

## 2018-10-17 LAB — TROPONIN I (HIGH SENSITIVITY): Troponin I (High Sensitivity): 108 ng/L (ref <18)

## 2018-10-17 NOTE — ED Triage Notes (Signed)
Pt arrives to ED via POV from home with c/o [facility] and tachycardia. Pt states recent d/x of bronchitis; reports taking antibiotics and steriod as prescribed. Pt states her HR at home was 113-147 bpm. Pt denies CP; no fever; no N/V/D. Pt is A&O, in NAD; RR even, regular, and unlabored.

## 2018-10-18 ENCOUNTER — Other Ambulatory Visit: Payer: Self-pay

## 2018-10-18 ENCOUNTER — Emergency Department: Payer: BC Managed Care – PPO

## 2018-10-18 ENCOUNTER — Inpatient Hospital Stay
Admission: EM | Admit: 2018-10-18 | Discharge: 2018-10-21 | DRG: 871 | Disposition: A | Discharge status: Home / Self Care | Payer: BC Managed Care – PPO | Attending: Internal Medicine | Admitting: Internal Medicine

## 2018-10-18 DIAGNOSIS — J440 Chronic obstructive pulmonary disease with acute lower respiratory infection: Secondary | ICD-10-CM | POA: Diagnosis present

## 2018-10-18 DIAGNOSIS — A419 Sepsis, unspecified organism: Secondary | ICD-10-CM

## 2018-10-18 DIAGNOSIS — N183 Chronic kidney disease, stage 3 (moderate): Secondary | ICD-10-CM | POA: Diagnosis present

## 2018-10-18 DIAGNOSIS — E785 Hyperlipidemia, unspecified: Secondary | ICD-10-CM | POA: Diagnosis present

## 2018-10-18 DIAGNOSIS — I248 Other forms of acute ischemic heart disease: Secondary | ICD-10-CM | POA: Diagnosis present

## 2018-10-18 DIAGNOSIS — I5033 Acute on chronic diastolic (congestive) heart failure: Secondary | ICD-10-CM | POA: Diagnosis present

## 2018-10-18 DIAGNOSIS — J189 Pneumonia, unspecified organism: Secondary | ICD-10-CM

## 2018-10-18 DIAGNOSIS — I482 Chronic atrial fibrillation, unspecified: Secondary | ICD-10-CM | POA: Diagnosis present

## 2018-10-18 DIAGNOSIS — R6520 Severe sepsis without septic shock: Secondary | ICD-10-CM | POA: Diagnosis present

## 2018-10-18 DIAGNOSIS — Z7952 Long term (current) use of systemic steroids: Secondary | ICD-10-CM | POA: Diagnosis not present

## 2018-10-18 DIAGNOSIS — Z79890 Hormone replacement therapy (postmenopausal): Secondary | ICD-10-CM | POA: Diagnosis not present

## 2018-10-18 DIAGNOSIS — R0602 Shortness of breath: Secondary | ICD-10-CM | POA: Diagnosis present

## 2018-10-18 DIAGNOSIS — Z7951 Long term (current) use of inhaled steroids: Secondary | ICD-10-CM | POA: Diagnosis not present

## 2018-10-18 DIAGNOSIS — N179 Acute kidney failure, unspecified: Secondary | ICD-10-CM | POA: Diagnosis present

## 2018-10-18 DIAGNOSIS — I4891 Unspecified atrial fibrillation: Secondary | ICD-10-CM

## 2018-10-18 DIAGNOSIS — Z20828 Contact with and (suspected) exposure to other viral communicable diseases: Secondary | ICD-10-CM | POA: Diagnosis present

## 2018-10-18 DIAGNOSIS — E1122 Type 2 diabetes mellitus with diabetic chronic kidney disease: Secondary | ICD-10-CM | POA: Diagnosis present

## 2018-10-18 DIAGNOSIS — D631 Anemia in chronic kidney disease: Secondary | ICD-10-CM | POA: Diagnosis present

## 2018-10-18 DIAGNOSIS — J45901 Unspecified asthma with (acute) exacerbation: Secondary | ICD-10-CM | POA: Diagnosis present

## 2018-10-18 DIAGNOSIS — I130 Hypertensive heart and chronic kidney disease with heart failure and stage 1 through stage 4 chronic kidney disease, or unspecified chronic kidney disease: Secondary | ICD-10-CM | POA: Diagnosis present

## 2018-10-18 DIAGNOSIS — I2489 Other forms of acute ischemic heart disease: Secondary | ICD-10-CM

## 2018-10-18 DIAGNOSIS — Z853 Personal history of malignant neoplasm of breast: Secondary | ICD-10-CM | POA: Diagnosis not present

## 2018-10-18 DIAGNOSIS — M109 Gout, unspecified: Secondary | ICD-10-CM | POA: Diagnosis present

## 2018-10-18 DIAGNOSIS — Z86711 Personal history of pulmonary embolism: Secondary | ICD-10-CM | POA: Diagnosis not present

## 2018-10-18 DIAGNOSIS — I340 Nonrheumatic mitral (valve) insufficiency: Secondary | ICD-10-CM | POA: Diagnosis not present

## 2018-10-18 DIAGNOSIS — N189 Chronic kidney disease, unspecified: Secondary | ICD-10-CM

## 2018-10-18 DIAGNOSIS — Z7901 Long term (current) use of anticoagulants: Secondary | ICD-10-CM | POA: Diagnosis not present

## 2018-10-18 DIAGNOSIS — Z79899 Other long term (current) drug therapy: Secondary | ICD-10-CM | POA: Diagnosis not present

## 2018-10-18 DIAGNOSIS — E039 Hypothyroidism, unspecified: Secondary | ICD-10-CM | POA: Diagnosis present

## 2018-10-18 DIAGNOSIS — I361 Nonrheumatic tricuspid (valve) insufficiency: Secondary | ICD-10-CM | POA: Diagnosis not present

## 2018-10-18 LAB — PROCALCITONIN: Procalcitonin: <0.1 ng/mL

## 2018-10-18 LAB — TROPONIN I (HIGH SENSITIVITY)
Troponin I (High Sensitivity): 132 ng/L (ref <18)
Troponin I (High Sensitivity): 114 ng/L (ref <18)

## 2018-10-18 LAB — LACTIC ACID, PLASMA: Lactic Acid, Venous: 1.4 mmol/L (ref 0.5–1.9)

## 2018-10-18 LAB — GLUCOSE, CAPILLARY
Glucose-Capillary: 242 mg/dL — ABNORMAL HIGH (ref 70–99)
Glucose-Capillary: 206 mg/dL — ABNORMAL HIGH (ref 70–99)

## 2018-10-18 LAB — HEMOGLOBIN A1C
Hgb A1c MFr Bld: 7.1 % — ABNORMAL HIGH (ref 4.8–5.6)
Mean Plasma Glucose: 157.07 mg/dL

## 2018-10-18 LAB — TSH: TSH: 0.065 u[IU]/mL — ABNORMAL LOW (ref 0.350–4.500)

## 2018-10-18 LAB — SARS CORONAVIRUS 2 BY RT PCR (HOSPITAL ORDER, PERFORMED IN ~~LOC~~ HOSPITAL LAB): SARS Coronavirus 2: NEGATIVE

## 2018-10-18 MED ORDER — INSULIN ASPART 100 UNIT/ML ~~LOC~~ SOLN
0.0000 [IU] | Freq: Every day | SUBCUTANEOUS | Status: DC
  Administered 2018-10-18 – 2018-10-19 (×2): 2 [IU] via SUBCUTANEOUS
  Filled 2018-10-18 (×2): qty 1

## 2018-10-18 MED ORDER — AMLODIPINE BESYLATE 5 MG PO TABS
5.0000 mg | ORAL_TABLET | Freq: Two times a day (BID) | ORAL | Status: DC
  Administered 2018-10-18 – 2018-10-21 (×7): 5 mg via ORAL
  Filled 2018-10-18 (×7): qty 1

## 2018-10-18 MED ORDER — SODIUM CHLORIDE 0.9 % IV SOLN
500.0000 mg | INTRAVENOUS | Status: DC
  Administered 2018-10-18 – 2018-10-19 (×2): 500 mg via INTRAVENOUS
  Filled 2018-10-18 (×2): qty 500

## 2018-10-18 MED ORDER — SODIUM CHLORIDE 0.9 % IV SOLN
INTRAVENOUS | Status: DC
  Administered 2018-10-18 – 2018-10-20 (×5): via INTRAVENOUS

## 2018-10-18 MED ORDER — ONDANSETRON HCL 4 MG PO TABS: 4.0000 mg | ORAL_TABLET | Freq: Four times a day (QID) | ORAL | Status: DC | PRN

## 2018-10-18 MED ORDER — ONDANSETRON HCL 4 MG/2ML IJ SOLN: 4.0000 mg | Freq: Four times a day (QID) | INTRAMUSCULAR | Status: DC | PRN

## 2018-10-18 MED ORDER — MOMETASONE FURO-FORMOTEROL FUM 200-5 MCG/ACT IN AERO
2.0000 | INHALATION_SPRAY | Freq: Two times a day (BID) | RESPIRATORY_TRACT | Status: DC
  Administered 2018-10-18 – 2018-10-21 (×7): 2 via RESPIRATORY_TRACT
  Filled 2018-10-18: qty 8.8

## 2018-10-18 MED ORDER — DOXYCYCLINE HYCLATE 100 MG PO TABS: 100.0000 mg | ORAL_TABLET | Freq: Two times a day (BID) | ORAL | Status: DC

## 2018-10-18 MED ORDER — ACETAMINOPHEN 650 MG RE SUPP: 650.0000 mg | Freq: Four times a day (QID) | RECTAL | Status: DC | PRN

## 2018-10-18 MED ORDER — VITAMIN D (ERGOCALCIFEROL) 1.25 MG (50000 UNIT) PO CAPS
50000.0000 [IU] | ORAL_CAPSULE | ORAL | Status: DC
  Administered 2018-10-18: 50000 [IU] via ORAL
  Filled 2018-10-18: qty 1

## 2018-10-18 MED ORDER — CALCIUM CARBONATE 1250 (500 CA) MG PO TABS
500.0000 mg | ORAL_TABLET | Freq: Every day | ORAL | Status: DC
  Administered 2018-10-18 – 2018-10-21 (×4): 500 mg via ORAL
  Filled 2018-10-18 (×4): qty 1

## 2018-10-18 MED ORDER — ATORVASTATIN CALCIUM 20 MG PO TABS
20.0000 mg | ORAL_TABLET | Freq: Every day | ORAL | Status: DC
  Administered 2018-10-18 – 2018-10-20 (×3): 20 mg via ORAL
  Filled 2018-10-18 (×3): qty 1

## 2018-10-18 MED ORDER — SODIUM CHLORIDE 0.9 % IV SOLN
1.0000 g | Freq: Once | INTRAVENOUS | Status: AC
  Administered 2018-10-18: 04:00:00 1 g via INTRAVENOUS
  Filled 2018-10-18: qty 10

## 2018-10-18 MED ORDER — INSULIN ASPART 100 UNIT/ML ~~LOC~~ SOLN
0.0000 [IU] | Freq: Three times a day (TID) | SUBCUTANEOUS | Status: DC
  Administered 2018-10-18: 18:00:00 7 [IU] via SUBCUTANEOUS
  Administered 2018-10-19 – 2018-10-20 (×4): 4 [IU] via SUBCUTANEOUS
  Administered 2018-10-20: 17:00:00 15 [IU] via SUBCUTANEOUS
  Administered 2018-10-20: 09:00:00 3 [IU] via SUBCUTANEOUS
  Administered 2018-10-21: 12:00:00 4 [IU] via SUBCUTANEOUS
  Administered 2018-10-21: 08:00:00 3 [IU] via SUBCUTANEOUS
  Filled 2018-10-18 (×9): qty 1

## 2018-10-18 MED ORDER — CLONIDINE HCL 0.1 MG PO TABS
0.2000 mg | ORAL_TABLET | Freq: Every day | ORAL | Status: DC
  Administered 2018-10-18: 10:00:00 0.2 mg via ORAL
  Filled 2018-10-18: qty 2

## 2018-10-18 MED ORDER — DILTIAZEM HCL ER COATED BEADS 180 MG PO CP24
180.0000 mg | ORAL_CAPSULE | Freq: Once | ORAL | Status: AC
  Administered 2018-10-18: 03:00:00 180 mg via ORAL
  Filled 2018-10-18 (×2): qty 1

## 2018-10-18 MED ORDER — INSULIN GLARGINE 100 UNIT/ML ~~LOC~~ SOLN
5.0000 [IU] | Freq: Every day | SUBCUTANEOUS | Status: DC
  Administered 2018-10-18 – 2018-10-20 (×3): 5 [IU] via SUBCUTANEOUS
  Filled 2018-10-18 (×4): qty 0.05

## 2018-10-18 MED ORDER — DOCUSATE SODIUM 100 MG PO CAPS
100.0000 mg | ORAL_CAPSULE | Freq: Two times a day (BID) | ORAL | Status: DC
  Administered 2018-10-18: 100 mg via ORAL
  Filled 2018-10-18 (×5): qty 1

## 2018-10-18 MED ORDER — FOLIC ACID 1 MG PO TABS
1.0000 mg | ORAL_TABLET | Freq: Every day | ORAL | Status: DC
  Administered 2018-10-18 – 2018-10-21 (×4): 1 mg via ORAL
  Filled 2018-10-18 (×4): qty 1

## 2018-10-18 MED ORDER — FUROSEMIDE 20 MG PO TABS: 20.0000 mg | ORAL_TABLET | Freq: Every day | ORAL | Status: DC | PRN

## 2018-10-18 MED ORDER — PREDNISONE 50 MG PO TABS
50.0000 mg | ORAL_TABLET | Freq: Every day | ORAL | Status: DC
  Administered 2018-10-18 – 2018-10-21 (×4): 50 mg via ORAL
  Filled 2018-10-18: qty 1
  Filled 2018-10-18: qty 2
  Filled 2018-10-18 (×2): qty 1

## 2018-10-18 MED ORDER — LEVOTHYROXINE SODIUM 112 MCG PO TABS
112.0000 ug | ORAL_TABLET | Freq: Every day | ORAL | Status: DC
  Administered 2018-10-18 – 2018-10-21 (×4): 112 ug via ORAL
  Filled 2018-10-18 (×4): qty 1

## 2018-10-18 MED ORDER — ALBUTEROL SULFATE (2.5 MG/3ML) 0.083% IN NEBU
2.5000 mg | INHALATION_SOLUTION | RESPIRATORY_TRACT | Status: DC | PRN
  Administered 2018-10-18: 21:00:00 2.5 mg via RESPIRATORY_TRACT
  Filled 2018-10-18: qty 3

## 2018-10-18 MED ORDER — DOXYCYCLINE HYCLATE 100 MG PO TABS
100.0000 mg | ORAL_TABLET | Freq: Once | ORAL | Status: AC
  Administered 2018-10-18: 100 mg via ORAL
  Filled 2018-10-18: qty 1

## 2018-10-18 MED ORDER — ACETAMINOPHEN 325 MG PO TABS: 650.0000 mg | ORAL_TABLET | Freq: Four times a day (QID) | ORAL | Status: DC | PRN

## 2018-10-18 MED ORDER — APIXABAN 5 MG PO TABS
5.0000 mg | ORAL_TABLET | Freq: Two times a day (BID) | ORAL | Status: DC
  Administered 2018-10-18 – 2018-10-21 (×7): 5 mg via ORAL
  Filled 2018-10-18 (×7): qty 1

## 2018-10-18 MED ORDER — CLONIDINE HCL 0.1 MG PO TABS
0.1000 mg | ORAL_TABLET | Freq: Two times a day (BID) | ORAL | Status: DC
  Administered 2018-10-18 – 2018-10-21 (×6): 0.1 mg via ORAL
  Filled 2018-10-18 (×6): qty 1

## 2018-10-18 MED ORDER — SODIUM CHLORIDE 0.9 % IV SOLN
1.0000 g | INTRAVENOUS | Status: DC
  Administered 2018-10-18 – 2018-10-21 (×3): 1 g via INTRAVENOUS
  Filled 2018-10-18 (×2): qty 1
  Filled 2018-10-18: qty 10
  Filled 2018-10-18: qty 1

## 2018-10-18 MED ORDER — ALPRAZOLAM 0.5 MG PO TABS: 0.5000 mg | ORAL_TABLET | ORAL | Status: DC | PRN

## 2018-10-18 NOTE — ED Notes (Signed)
Medications administered per MD order. Pt remains resting in bed, remains on 2L via [place]. Pt provided with breakfast tray. Delay explained and apologized for by this RN. Pt states understanding. Will continue to monitor for further patient needs. Call bell within reach, pt states will use if needed.

## 2018-10-18 NOTE — ED Notes (Signed)
Pt assisted to the bathroom by this RN. Pt tolerated well. Back to bed without incident. Repositioned in bed. Pt tolerated well.

## 2018-10-18 NOTE — Progress Notes (Signed)
Patient admitted early this morning.  Seen and examined by me later this morning.  She states she is feeling a little bit better than when she first came in.  On exam, she has an irregularly irregular rhythm, but regular rate.  Lungs are clear to auscultation bilaterally.  Sepsis secondary to community-acquired pneumonia- this may have precipitated her A. Fib.  Meeting sepsis criteria on admission with leukocytosis and tachycardia. -Continue ceftriaxone and azithromycin -Check procalcitonin -Blood cultures ordered  New onset atrial fibrillation with RVR-now rate controlled. 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  No active chest pain -Monitor  Hypothyroidism- TSH is low on admission -Check T4 and T3 -Continue home dose Synthroid for now  Hyman Bible, MD

## 2018-10-18 NOTE — ED Notes (Signed)
ED TO INPATIENT HANDOFF REPORT  ED Nurse Name and Phone #: Valetta Fuller [phone]  S Name/Age/Gender Nicole Schmidt 53 y.o. female Room/Bed: ED10A/ED10A  Code Status   Code Status: Prior  Home/SNF/Other Home Patient oriented to: self, place, time and situation Is this baseline? Yes   Triage Complete: Triage complete  Chief Complaint Shob/heart racing  Triage Note Pt arrives to ED via POV from home with c/o [facility] and tachycardia. Pt states recent d/x of bronchitis; reports taking antibiotics and steriod as prescribed. Pt states her HR at home was 113-147 bpm. Pt denies CP; no fever; no N/V/D. Pt is A&O, in NAD; RR even, regular, and unlabored.   Allergies No Known Allergies  Level of Care/Admitting Diagnosis ED Disposition    ED Disposition Condition [facility] Area: [place] [[id number]]  Level of Care: Telemetry [5]  Covid Evaluation: Confirmed COVID Negative  Diagnosis: Atrial fibrillation with RVR [facility]) [[id number]]  Admitting Physician: Harrie Foreman [[id number]]  Attending Physician: Harrie Foreman [[id number]]  Estimated length of stay: past midnight tomorrow  Certification:: I certify this patient will need inpatient services for at least 2 midnights  PT Class (Do Not Modify): Inpatient [101]  PT Acc Code (Do Not Modify): Private [1]       B Medical/Surgery History Past Medical History:  Diagnosis Date  . Anemia   . Arthritis   . Breast cancer of upper-inner quadrant of right female breast ([place])    Right breast invasive CA and DCIS , 7 mm T1,N0,M0. Er/PR pos, her 2 negative.  Margins 1 mm.  . CHF (congestive heart failure) ([place])   . Diabetes mellitus without complication ([place])   . Gout   . Gout   . Hypertension   . Menopause    age 25  . Personal history of radiation therapy   . Pulmonary embolism ([place]) 10/2014  . Renal insufficiency   . Thyroid goiter    Past Surgical History:  Procedure Laterality Date  . BREAST  BIOPSY Right 2014   breast ca  . BREAST EXCISIONAL BIOPSY Right 07/19/2012   breast ca  . BREAST SURGERY Right 2014   with sentinel node bx subareaolar duct excision  . COLONOSCOPY WITH PROPOFOL N/A 01/30/2017   Procedure: COLONOSCOPY WITH PROPOFOL;  Surgeon: Christene Lye, MD;  Location: [facility] ENDOSCOPY;  Service: Endoscopy;  Laterality: N/A;     A IV Location/Drains/Wounds Patient Lines/Drains/Airways Status   Active Line/Drains/Airways    Name:   Placement date:   Placement time:   Site:   Days:   Peripheral IV 10/18/18 Left Antecubital   10/18/18    0100    Antecubital   less than 1   Peripheral IV 10/18/18 Right;Anterior Forearm   10/18/18    0236    Forearm   less than 1   Incision (Closed) 11/20/17 Neck Right;Lateral   11/20/17    2000     332          Intake/Output Last 24 hours No intake or output data in the 24 hours ending 10/18/18 0504  Labs/Imaging Results for orders placed or performed during the hospital encounter of 10/18/18 (from the past 48 hour(s))  Basic metabolic panel     Status: Abnormal   Collection Time: 10/17/18 11:10 PM  Result Value Ref Range   Sodium 141 135 - 145 mmol/L   Potassium 4.0 3.5 - 5.1 mmol/L   Chloride 107 98 - 111 mmol/L  CO2 23 22 - 32 mmol/L   Glucose, Bld 218 (H) 70 - 99 mg/dL   BUN 89 (H) 6 - 20 mg/dL   Creatinine, Ser 2.30 (H) 0.44 - 1.00 mg/dL   Calcium 8.5 (L) 8.9 - 10.3 mg/dL   GFR calc non Af Amer 23 (L) >60 mL/min   GFR calc Af Amer 27 (L) >60 mL/min   Anion gap 11 5 - 15    Comment: Performed at [facility], [address] [place], [place] [address]  CBC     Status: Abnormal   Collection Time: 10/17/18 11:10 PM  Result Value Ref Range   WBC 18.8 (H) 4.0 - 10.5 K/uL   RBC 5.04 3.87 - 5.11 MIL/uL   Hemoglobin 14.3 12.0 - 15.0 g/dL   HCT 44.9 36.0 - 46.0 %   MCV 89.1 80.0 - 100.0 fL   MCH 28.4 26.0 - 34.0 pg   MCHC 31.8 30.0 - 36.0 g/dL   RDW 13.3 11.5 - 15.5 %   Platelets 265 150 - 400 K/uL    nRBC 0.0 0.0 - 0.2 %    Comment: Performed at [facility], [address], [place], [place] [address]  Troponin I (High Sensitivity)     Status: Abnormal   Collection Time: 10/17/18 11:10 PM  Result Value Ref Range   Troponin I (High Sensitivity) 108 (HH) <18 ng/L    Comment: CRITICAL RESULT CALLED TO, READ BACK BY AND VERIFIED WITH LISA THOMPSON ON 10/17/18 AT 2350 [place] (NOTE) Elevated high sensitivity troponin I (hsTnI) values and significant  changes across serial measurements may suggest ACS but many other  chronic and acute conditions are known to elevate hsTnI results.  Refer to the "Links" section for chest pain algorithms and additional  guidance. Performed at [facility], [address] [place], [place] [address]   SARS Coronavirus 2 [facility] order, Performed in [facility] hospital lab) Nasopharyngeal Nasopharyngeal Swab     Status: None   Collection Time: 10/18/18  1:06 AM   Specimen: Nasopharyngeal Swab  Result Value Ref Range   SARS Coronavirus 2 NEGATIVE NEGATIVE    Comment: (NOTE) If result is NEGATIVE SARS-CoV-2 target nucleic acids are NOT DETECTED. The SARS-CoV-2 RNA is generally detectable in upper and lower  respiratory specimens during the acute phase of infection. The lowest  concentration of SARS-CoV-2 viral copies this assay can detect is 250  copies / mL. A negative result does not preclude SARS-CoV-2 infection  and should not be used as the sole basis for treatment or other  patient management decisions.  A negative result may occur with  improper specimen collection / handling, submission of specimen other  than nasopharyngeal swab, presence of viral mutation(s) within the  areas targeted by this assay, and inadequate number of viral copies  (<250 copies / mL). A negative result must be combined with clinical  observations, patient history, and epidemiological information. If result is POSITIVE SARS-CoV-2 target nucleic acids are  DETECTED. The SARS-CoV-2 RNA is generally detectable in upper and lower  respiratory specimens dur ing the acute phase of infection.  Positive  results are indicative of active infection with SARS-CoV-2.  Clinical  correlation with patient history and other diagnostic information is  necessary to determine patient infection status.  Positive results do  not rule out bacterial infection or co-infection with other viruses. If result is PRESUMPTIVE POSTIVE SARS-CoV-2 nucleic acids MAY BE PRESENT.   A presumptive positive result was obtained on  the submitted specimen  and confirmed on repeat testing.  While 2019 novel coronavirus  (SARS-CoV-2) nucleic acids may be present in the submitted sample  additional confirmatory testing may be necessary for epidemiological  and / or clinical management purposes  to differentiate between  SARS-CoV-2 and other Sarbecovirus currently known to infect humans.  If clinically indicated additional testing with an alternate test  methodology [phone]) is advised. The SARS-CoV-2 RNA is generally  detectable in upper and lower respiratory sp ecimens during the acute  phase of infection. The expected result is Negative. Fact Sheet for Patients:  StrictlyIdeas.no Fact Sheet for Healthcare Providers: BankingDealers.co.za This test is not yet approved or cleared by the [place] FDA and has been authorized for detection and/or diagnosis of SARS-CoV-2 by FDA under an Emergency Use Authorization (EUA).  This EUA will remain in effect (meaning this test can be used) for the duration of the COVID-19 declaration under Section 564(b)(1) of the Act, 21 U.S.C. section 360bbb-3(b)(1), unless the authorization is terminated or revoked sooner. Performed at [facility], [address] [place], [place] [address]   Troponin I (High Sensitivity)     Status: Abnormal   Collection Time: 10/18/18  1:06 AM  Result  Value Ref Range   Troponin I (High Sensitivity) 132 (HH) <18 ng/L    Comment: CRITICAL RESULT CALLED TO, READ BACK BY AND VERIFIED WITH KATIE SPOUSE ON 10/18/18 AT 0145 [place] (NOTE) Elevated high sensitivity troponin I (hsTnI) values and significant  changes across serial measurements may suggest ACS but many other  chronic and acute conditions are known to elevate hsTnI results.  Refer to the "Links" section for chest pain algorithms and additional  guidance. Performed at [facility], [address] [place], [place] [address]   Lactic acid, plasma     Status: None   Collection Time: 10/18/18  2:07 AM  Result Value Ref Range   Lactic Acid, Venous 1.4 0.5 - 1.9 mmol/L    Comment: Performed at [facility], [place], [place] [address]  Troponin I (High Sensitivity)     Status: Abnormal   Collection Time: 10/18/18  2:56 AM  Result Value Ref Range   Troponin I (High Sensitivity) 114 (HH) <18 ng/L    Comment: CRITICAL VALUE NOTED. VALUE IS CONSISTENT WITH PREVIOUSLY REPORTED/CALLED VALUE [place] (NOTE) Elevated high sensitivity troponin I (hsTnI) values and significant  changes across serial measurements may suggest ACS but many other  chronic and acute conditions are known to elevate hsTnI results.  Refer to the "Links" section for chest pain algorithms and additional  guidance. Performed at [facility], [address] [place], [place] [address]    Dg Chest 1 View  Result Date: 10/18/2018 CLINICAL DATA:  Shortness of breath, tachycardia, recent diagnosis of bronchitis EXAM: CHEST  1 VIEW COMPARISON:  Radiograph 03/29/2018, CT 10/18/2014 FINDINGS: There is collapse of the left lower lobe. More diffuse airways thickening is seen in the right lung base with hazy interstitial opacities. The aorta is calcified. Cardiomediastinal contours are otherwise unremarkable. Degenerative changes are present in the imaged spine and shoulders. No acute osseous or  soft tissue abnormality IMPRESSION: 1. Left lower lobe collapse. 2. More diffuse airways thickening in the right lung base with hazy interstitial opacities, concerning for bronchopneumonia. Electronically Signed   By: Lovena Le M.D.   On: 10/18/2018 00:49    Pending Labs [employer] (From admission, onward)    Start  Ordered   10/18/18 0125  Blood culture (routine x 2)  BLOOD CULTURE X 2,   STAT     10/18/18 0124   Signed and Held  TSH  Add-on,   R     Signed and Held   Signed and Held  Hemoglobin A1c  Add-on,   R     Signed and Held          Vitals/Pain Today's Vitals   10/17/18 2256 10/17/18 2258 10/18/18 0401  BP:  (!) 152/85 (!) 168/75  Pulse:  (!) 113 (!) 109  Resp:  20 20  Temp:  98.6 F (37 C)   TempSrc:  Oral   SpO2:  96% 95%  Weight: (!) 145.2 kg    Height: 5\' 4"  (1.626 m)    PainSc: 0-No pain      Isolation Precautions No active isolations  Medications Medications  diltiazem (CARDIZEM CD) 24 hr capsule 180 mg (180 mg Oral Given 10/18/18 0231)  cefTRIAXone (ROCEPHIN) 1 g in sodium chloride 0.9 % 100 mL IVPB (0 g Intravenous Stopped 10/18/18 0443)  doxycycline (VIBRA-TABS) tablet 100 mg (100 mg Oral Given 10/18/18 0145)    Mobility walks Low fall risk   Focused Assessments Pulmonary Assessment Handoff:  Lung sounds:   O2 Device: Room Air        R Recommendations: See Admitting Provider Note  Report given to:   Additional Notes:

## 2018-10-18 NOTE — H&P (Signed)
Nicole Schmidt is an 53 y.o. female.   Chief Complaint: Shortness of breath HPI: The patient with past medical history of diastolic heart failure, hypertension, hypothyroidism, pulmonary embolism and history of breast cancer presents emergency department complaining of shortness of breath.  The patient reports that she was diagnosed with bronchitis 2 weeks ago and had remained out of work until a few days ago.  She had been taking antibiotics as well as steroids without relief.  Though she has returned to work she continues to have dyspnea with exertion.  Yesterday the patient reports "feeling funny" prior to admission.  She checked her heart rate on multiple occasions and found it to be between 110 and 130.  She called her nurse helpline associated with her insurance and was advised to go to the emergency department for evaluation where she was found to have atrial fibrillation with rapid ventricular rate.  The patient was stable throughout her ED evaluation, however due to uncontrolled heart rate the emergency department staff called the hospitalist service for admission.  Past Medical History:  Diagnosis Date  . Anemia   . Arthritis   . Breast cancer of upper-inner quadrant of right female breast ([place])    Right breast invasive CA and DCIS , 7 mm T1,N0,M0. Er/PR pos, her 2 negative.  Margins 1 mm.  . CHF (congestive heart failure) ([place])   . Diabetes mellitus without complication ([place])   . Gout   . Gout   . Hypertension   . Menopause    age 28  . Personal history of radiation therapy   . Pulmonary embolism ([place]) 10/2014  . Renal insufficiency   . Thyroid goiter     Past Surgical History:  Procedure Laterality Date  . BREAST BIOPSY Right 2014   breast ca  . BREAST EXCISIONAL BIOPSY Right 07/19/2012   breast ca  . BREAST SURGERY Right 2014   with sentinel node bx subareaolar duct excision  . COLONOSCOPY WITH PROPOFOL N/A 01/30/2017   Procedure: COLONOSCOPY WITH PROPOFOL;  Surgeon:  Christene Lye, MD;  Location: [facility] ENDOSCOPY;  Service: Endoscopy;  Laterality: N/A;    Family History  Problem Relation Age of Onset  . Diabetes Father   . Hypertension Father   . Parkinson's disease Father   . Hypertension Mother   . Rheum arthritis Mother   . Atrial fibrillation Mother   . Heart failure Mother   . Colon cancer Cousin 69  . Breast cancer Neg Hx    Social History:  reports that she has never smoked. She has never used smokeless tobacco. She reports that she does not drink alcohol or use drugs.  Allergies: No Known Allergies  Prior to Admission medications   Medication Sig Start Date End Date Taking? Authorizing Provider  albuterol (VENTOLIN HFA) 108 (90 Base) MCG/ACT inhaler Inhale 2 puffs into the lungs every 6 (six) hours as needed for wheezing or shortness of breath.   Yes [provider]  ALPRAZolam Duanne Moron) 0.5 MG tablet Take 0.5 mg by mouth as needed for anxiety.    Yes [provider]  amLODipine (NORVASC) 5 MG tablet Take 5 mg by mouth 2 (two) times daily. 09/18/18  Yes [provider]  apixaban (ELIQUIS) 5 MG TABS tablet Take 1 tablet (5 mg total) by mouth 2 (two) times daily. 07/15/18  Yes Corcoran, Drue Second, MD  Ascorbic Acid (VITAMIN C PO) Take 1 tablet by mouth 2 (two) times daily.    Yes [provider]  atorvastatin (LIPITOR) 20 MG tablet Take 20 mg by mouth at bedtime.    Yes [provider]  calcium carbonate (OSCAL) 1500 (600 Ca) MG TABS tablet Take 600 mg of elemental calcium by mouth daily.    Yes [provider]  cephALEXin (KEFLEX) 500 MG capsule Take 500 mg by mouth 3 (three) times daily. 10/07/18 10/18/18 Yes [provider]  Cholecalciferol (VITAMIN D3) 50000 units TABS Take 1 tablet by mouth once a week.   Yes [provider]  CINNAMON PO Take 1,000 mg by mouth daily.    Yes [provider]  cloNIDine (CATAPRES) 0.2 MG tablet Take 1 tablet (0.2 mg total) by  mouth 2 (two) times daily. Patient taking differently: Take 0.2 mg by mouth daily.  04/03/18  Yes Wieting, Richard, MD  Dulaglutide (TRULICITY) 1.5 JD/0.5XG SOPN Inject 1.5 mg into the skin every Monday. night   Yes [provider]  fluticasone-salmeterol (ADVAIR HFA) 230-21 MCG/ACT inhaler Inhale 2 puffs into the lungs 2 (two) times daily.   Yes [provider]  folic acid (FOLVITE) 335 MCG tablet Take 800 mcg by mouth daily.    Yes [provider]  furosemide (LASIX) 20 MG tablet Take 20 mg by mouth daily as needed for edema.    Yes [provider]  levothyroxine (SYNTHROID, LEVOTHROID) 112 MCG tablet Take 112 mcg daily before breakfast by mouth.   Yes [provider]  predniSONE (STERAPRED UNI-PAK 48 TAB) 10 MG (48) TBPK tablet Take by mouth as directed.   Yes [provider]     Results for orders placed or performed during the hospital encounter of 10/18/18 (from the past 48 hour(s))  Basic metabolic panel     Status: Abnormal   Collection Time: 10/17/18 11:10 PM  Result Value Ref Range   Sodium 141 135 - 145 mmol/L   Potassium 4.0 3.5 - 5.1 mmol/L   Chloride 107 98 - 111 mmol/L   CO2 23 22 - 32 mmol/L   Glucose, Bld 218 (H) 70 - 99 mg/dL   BUN 89 (H) 6 - 20 mg/dL   Creatinine, Ser 2.30 (H) 0.44 - 1.00 mg/dL   Calcium 8.5 (L) 8.9 - 10.3 mg/dL   GFR calc non Af Amer 23 (L) >60 mL/min   GFR calc Af Amer 27 (L) >60 mL/min   Anion gap 11 5 - 15    Comment: Performed at [facility], [address] [place], [place] [address]  CBC     Status: Abnormal   Collection Time: 10/17/18 11:10 PM  Result Value Ref Range   WBC 18.8 (H) 4.0 - 10.5 K/uL   RBC 5.04 3.87 - 5.11 MIL/uL   Hemoglobin 14.3 12.0 - 15.0 g/dL   HCT 44.9 36.0 - 46.0 %   MCV 89.1 80.0 - 100.0 fL   MCH 28.4 26.0 - 34.0 pg   MCHC 31.8 30.0 - 36.0 g/dL   RDW 13.3 11.5 - 15.5 %   Platelets 265 150 - 400 K/uL   nRBC 0.0 0.0 - 0.2 %    Comment: Performed at  [facility], [address] [place], [place] [address]  Troponin I (High Sensitivity)     Status: Abnormal   Collection Time: 10/17/18 11:10 PM  Result Value Ref Range   Troponin I (High Sensitivity) 108 (HH) <18 ng/L    Comment: CRITICAL RESULT CALLED TO, READ BACK BY AND VERIFIED WITH LISA THOMPSON ON 10/17/18 AT 2350 [facility] (NOTE) Elevated  high sensitivity troponin I (hsTnI) values and significant  changes across serial measurements may suggest ACS but many other  chronic and acute conditions are known to elevate hsTnI results.  Refer to the "Links" section for chest pain algorithms and additional  guidance. Performed at [facility], [address] [place], [place] [address]   SARS Coronavirus 2 [facility] order, Performed in [facility] hospital lab) Nasopharyngeal Nasopharyngeal Swab     Status: None   Collection Time: 10/18/18  1:06 AM   Specimen: Nasopharyngeal Swab  Result Value Ref Range   SARS Coronavirus 2 NEGATIVE NEGATIVE    Comment: (NOTE) If result is NEGATIVE SARS-CoV-2 target nucleic acids are NOT DETECTED. The SARS-CoV-2 RNA is generally detectable in upper and lower  respiratory specimens during the acute phase of infection. The lowest  concentration of SARS-CoV-2 viral copies this assay can detect is 250  copies / mL. A negative result does not preclude SARS-CoV-2 infection  and should not be used as the sole basis for treatment or other  patient management decisions.  A negative result may occur with  improper specimen collection / handling, submission of specimen other  than nasopharyngeal swab, presence of viral mutation(s) within the  areas targeted by this assay, and inadequate number of viral copies  (<250 copies / mL). A negative result must be combined with clinical  observations, patient history, and epidemiological information. If result is POSITIVE SARS-CoV-2 target nucleic acids are DETECTED. The SARS-CoV-2 RNA is generally  detectable in upper and lower  respiratory specimens dur ing the acute phase of infection.  Positive  results are indicative of active infection with SARS-CoV-2.  Clinical  correlation with patient history and other diagnostic information is  necessary to determine patient infection status.  Positive results do  not rule out bacterial infection or co-infection with other viruses. If result is PRESUMPTIVE POSTIVE SARS-CoV-2 nucleic acids MAY BE PRESENT.   A presumptive positive result was obtained on the submitted specimen  and confirmed on repeat testing.  While 2019 novel coronavirus  (SARS-CoV-2) nucleic acids may be present in the submitted sample  additional confirmatory testing may be necessary for epidemiological  and / or clinical management purposes  to differentiate between  SARS-CoV-2 and other Sarbecovirus currently known to infect humans.  If clinically indicated additional testing with an alternate test  methodology [id number]) is advised. The SARS-CoV-2 RNA is generally  detectable in upper and lower respiratory sp ecimens during the acute  phase of infection. The expected result is Negative. Fact Sheet for Patients:  StrictlyIdeas.no Fact Sheet for Healthcare Providers: BankingDealers.co.za This test is not yet approved or cleared by the [place] FDA and has been authorized for detection and/or diagnosis of SARS-CoV-2 by FDA under an Emergency Use Authorization (EUA).  This EUA will remain in effect (meaning this test can be used) for the duration of the COVID-19 declaration under Section 564(b)(1) of the Act, 21 U.S.C. section 360bbb-3(b)(1), unless the authorization is terminated or revoked sooner. Performed at [facility], [address] [place], [place] [address]   Troponin I (High Sensitivity)     Status: Abnormal   Collection Time: 10/18/18  1:06 AM  Result Value Ref Range   Troponin I (High  Sensitivity) 132 (HH) <18 ng/L    Comment: CRITICAL RESULT CALLED TO, READ BACK BY AND VERIFIED WITH KATIE SPOUSE ON 10/18/18 AT 0145 [place] (NOTE) Elevated high sensitivity troponin I (hsTnI) values and significant  changes across serial measurements may suggest  ACS but many other  chronic and acute conditions are known to elevate hsTnI results.  Refer to the "Links" section for chest pain algorithms and additional  guidance. Performed at [facility], [address] [place], [place] [address]   Lactic acid, plasma     Status: None   Collection Time: 10/18/18  2:07 AM  Result Value Ref Range   Lactic Acid, Venous 1.4 0.5 - 1.9 mmol/L    Comment: Performed at [facility], [address], [place] [address]  Troponin I (High Sensitivity)     Status: Abnormal   Collection Time: 10/18/18  2:56 AM  Result Value Ref Range   Troponin I (High Sensitivity) 114 (HH) <18 ng/L    Comment: CRITICAL VALUE NOTED. VALUE IS CONSISTENT WITH PREVIOUSLY REPORTED/CALLED VALUE [place] (NOTE) Elevated high sensitivity troponin I (hsTnI) values and significant  changes across serial measurements may suggest ACS but many other  chronic and acute conditions are known to elevate hsTnI results.  Refer to the "Links" section for chest pain algorithms and additional  guidance. Performed at [facility], [address] [place], [place] [address]   Blood culture (routine x 2)     Status: None (Preliminary result)   Collection Time: 10/18/18  2:56 AM   Specimen: Blood  Result Value Ref Range   Specimen Description BLOOD RIGHT ASSIST CONTROL    Special Requests      BOTTLES DRAWN AEROBIC AND ANAEROBIC Blood Culture results may not be optimal due to an excessive volume of blood received in culture bottles   Culture      NO GROWTH < 12 HOURS Performed at [facility], [address] [place], [place] [address]    Report Status PENDING   TSH     Status: Abnormal    Collection Time: 10/18/18  2:56 AM  Result Value Ref Range   TSH 0.065 (L) 0.350 - 4.500 uIU/mL    Comment: Performed by a 3rd Generation assay with a functional sensitivity of <=0.01 uIU/mL. Performed at [facility], [address] [place], [place] [address]   Blood culture (routine x 2)     Status: None (Preliminary result)   Collection Time: 10/18/18  3:04 AM   Specimen: Blood  Result Value Ref Range   Specimen Description BLOOD LEFT HAND    Special Requests      BOTTLES DRAWN AEROBIC AND ANAEROBIC Blood Culture results may not be optimal due to an excessive volume of blood received in culture bottles   Culture      NO GROWTH < 12 HOURS Performed at [facility], [address] [place],  [address]    Report Status PENDING    Dg Chest 1 View  Result Date: 10/18/2018 CLINICAL DATA:  Shortness of breath, tachycardia, recent diagnosis of bronchitis EXAM: CHEST  1 VIEW COMPARISON:  Radiograph 03/29/2018, CT 10/18/2014 FINDINGS: There is collapse of the left lower lobe. More diffuse airways thickening is seen in the right lung base with hazy interstitial opacities. The aorta is calcified. Cardiomediastinal contours are otherwise unremarkable. Degenerative changes are present in the imaged spine and shoulders. No acute osseous or soft tissue abnormality IMPRESSION: 1. Left lower lobe collapse. 2. More diffuse airways thickening in the right lung base with hazy interstitial opacities, concerning for bronchopneumonia. Electronically Signed   By: Lovena Le M.D.   On: 10/18/2018 00:49    Review of Systems  Constitutional: Negative for chills and fever.  HENT: Negative for sore throat and tinnitus.   Eyes: Negative for blurred vision and redness.  Respiratory: Positive for cough and shortness of breath.   Cardiovascular: Negative for chest pain, palpitations, orthopnea and PND.  Gastrointestinal: Negative for abdominal pain, diarrhea, nausea and vomiting.   Genitourinary: Negative for dysuria, frequency and urgency.  Musculoskeletal: Negative for joint pain and myalgias.  Skin: Negative for rash.       No lesions  Neurological: Negative for speech change, focal weakness and weakness.  Endo/Heme/Allergies: Does not bruise/bleed easily.       No temperature intolerance  Psychiatric/Behavioral: Negative for depression and suicidal ideas.    Blood pressure (!) 172/76, pulse 89, temperature 98.6 F (37 C), temperature source Oral, resp. rate 20, height 5\' 4"  (1.626 m), weight (!) 145.2 kg, last menstrual period 07/17/2012, SpO2 100 %. Physical Exam  Vitals reviewed. Constitutional: She is oriented to person, place, and time. She appears well-developed and well-nourished. No distress.  HENT:  Head: Normocephalic and atraumatic.  Mouth/Throat: Oropharynx is clear and moist.  Eyes: Pupils are equal, round, and reactive to light. Conjunctivae and EOM are normal. No scleral icterus.  Neck: Normal range of motion. Neck supple. No JVD present. No tracheal deviation present. No thyromegaly present.  Cardiovascular: Normal rate, regular rhythm and normal heart sounds. Exam reveals no gallop and no friction rub.  No murmur heard. Respiratory: Effort normal and breath sounds normal.  GI: Soft. Bowel sounds are normal. She exhibits no distension. There is no abdominal tenderness.  Genitourinary:    Genitourinary Comments: Deferred   Musculoskeletal: Normal range of motion.        General: No edema.  Lymphadenopathy:    She has no cervical adenopathy.  Neurological: She is alert and oriented to person, place, and time. No cranial nerve deficit. She exhibits normal muscle tone.  Skin: Skin is warm and dry. No rash noted. No erythema.  Psychiatric: She has a normal mood and affect. Her behavior is normal. Judgment and thought content normal.     Assessment/Plan This is a 53 year old female admitted for atrial fibrillation with rapid ventricular  rate. 1.  Atrial fibrillation: Chronic; with rapid ventricular rate. Rate improving following oral Cardizem.  Continue Eliquis for anticoagulation.  Monitor telemetry 2.  Pneumonia: Community-acquired; continue ceftriaxone and azithromycin plus or minus prednisone.  I have examined the patient's last x-ray myself and and unsure if they read regarding collapse of the left lower lobe is accurate.  The patient's oxygen saturations are good on room air and she is in no respiratory distress at this time.  The patient also has asthma/COPD.  Continue inhaled corticosteroid as well as albuterol as needed. 3.  Acute kidney injury: Superimposed upon chronic kidney disease.  Hydrate with intravenous fluid cautiously due to chronic diastolic heart failure. 4.  Hypertension: Uncontrolled; continue amlodipine and clonidine.  Add beta-blocker.  Hydralazine as needed. 5.  Hypothyroidism: Continue Synthroid.  Check TSH 6.  Hyperlipidemia: Continue statin therapy 7.  DVT prophylaxis: As above 8.  GI prophylaxis: None The patient is a full code.  Time spent on admission orders and patient care approximately 45 minutes  Harrie Foreman, MD 10/18/2018, 9:32 AM

## 2018-10-18 NOTE — Progress Notes (Signed)
Inpatient Diabetes Program Recommendations  AACE/ADA: New Consensus Statement on Inpatient Glycemic Control (2015)  Target Ranges:  Prepandial:   less than 140 mg/dL      Peak postprandial:   less than 180 mg/dL (1-2 hours)      Critically ill patients:  140 - 180 mg/dL   Lab Results  Component Value Date   GLUCAP 98 04/03/2018   HGBA1C 7.1 (H) 10/18/2018    Review of Glycemic Control Results for Nicole Schmidt, Nicole Schmidt (MRN [id number]) as of 10/18/2018 14:25  Ref. Range 10/17/2018 23:10  Glucose Latest Ref Range: 70 - 99 mg/dL 218 (H)   Diabetes history: DM 2 Outpatient Diabetes medications: Trulicity 1.5 mg weekly Current orders for Inpatient glycemic control:  None noted  Inpatient Diabetes Program Recommendations:    May consider adding Novolog moderate tid with meals and HS.    Thanks  Adah Perl, RN, BC-ADM Inpatient Diabetes Coordinator Pager [phone] (8a-5p)

## 2018-10-18 NOTE — ED Notes (Signed)
This RN spoke with Zigmund Daniel on 2A, states receiving RN is in an isolation room, however states that receiving RN has read chart and to bring patient up.

## 2018-10-18 NOTE — ED Notes (Signed)
Messaged pharmacy regarding verifying medications, will administer after pharmacist verification. Will continue to review sign & held orders for admission.

## 2018-10-18 NOTE — ED Notes (Addendum)
O2 sat 92% on room air. Pt placed on 2 L via Rosa Sanchez per admission orders.

## 2018-10-18 NOTE — ED Provider Notes (Signed)
[facility] Emergency Department Provider Note  ____________________________________________  Time seen: Approximately 12:30 AM  I have reviewed the triage vital signs and the nursing notes.   HISTORY  Chief Complaint Shortness of Breath and Tachycardia   HPI Nicole Schmidt is a 53 y.o. female with a history of diastolic CHF, anemia, breast cancer in remission, diabetes, chronic kidney disease, PE on Eliquis, asthma who presents for evaluation of shortness of breath and tachycardia.  Patient reports that she is currently on her second round of prednisone and antibiotics for acute bronchitis.  She reports that she is feeling better however still having wheezing, dry cough, and shortness of breath.  Today she was feeling off and checked again her pulse oximeter.  She noted that her pulse was oscillating between 110s and 130s.  She denies ever having history of atrial fibrillation.  She denies chest pain or fever.  Her shortness of breath is constant but worse with exertion.  She denies hemoptysis, leg pain or swelling.  She denies missing a dose of Eliquis.   Past Medical History:  Diagnosis Date  . Anemia   . Arthritis   . Breast cancer of upper-inner quadrant of right female breast ([place])    Right breast invasive CA and DCIS , 7 mm T1,N0,M0. Er/PR pos, her 2 negative.  Margins 1 mm.  . CHF (congestive heart failure) ([place])   . Diabetes mellitus without complication ([place])   . Gout   . Gout   . Hypertension   . Menopause    age 76  . Personal history of radiation therapy   . Pulmonary embolism ([place]) 10/2014  . Renal insufficiency   . Thyroid goiter     Patient Active Problem List   Diagnosis Date Noted  . Atrial fibrillation with RVR ([place]) 10/18/2018  . Acute hypoxemic respiratory failure ([place]) 03/29/2018  . Chronic diastolic heart failure ([place]) 02/04/2018  . Lymphedema 02/04/2018  . HTN (hypertension) 12/09/2017  . Long term (current) use of  aromatase inhibitors 11/17/2017  . Elevated alkaline phosphatase level 05/20/2017  . Goals of care, counseling/discussion 05/14/2017  . Chronic renal disease, stage III ([place]) 07/08/2015  . Iron deficiency anemia 07/08/2015  . Acute renal failure superimposed on stage 3 chronic kidney disease ([place]) 10/22/2014  . History of breast cancer 10/22/2014  . Elevated troponin 10/22/2014  . Pulmonary emboli ([place]) 10/18/2014  . Pulmonary embolism ([place]) 10/18/2014  . Candidiasis of breast 07/06/2014  . Malignant neoplasm of right female breast ([place]) 07/24/2012    Past Surgical History:  Procedure Laterality Date  . BREAST BIOPSY Right 2014   breast ca  . BREAST EXCISIONAL BIOPSY Right 07/19/2012   breast ca  . BREAST SURGERY Right 2014   with sentinel node bx subareaolar duct excision  . COLONOSCOPY WITH PROPOFOL N/A 01/30/2017   Procedure: COLONOSCOPY WITH PROPOFOL;  Surgeon: Christene Lye, MD;  Location: [facility] ENDOSCOPY;  Service: Endoscopy;  Laterality: N/A;    Prior to Admission medications   Medication Sig Start Date End Date Taking? Authorizing Provider  albuterol (VENTOLIN HFA) 108 (90 Base) MCG/ACT inhaler Inhale 2 puffs into the lungs every 6 (six) hours as needed for wheezing or shortness of breath.   Yes [provider]  ALPRAZolam Duanne Moron) 0.5 MG tablet Take 0.5 mg by mouth as needed for anxiety.    Yes [provider]  amLODipine (NORVASC) 5 MG tablet Take 5 mg by mouth 2 (two) times daily. 09/18/18  Yes [provider]  apixaban (ELIQUIS) 5 MG TABS tablet Take 1 tablet (5 mg total) by mouth 2 (two) times daily. 07/15/18  Yes Corcoran, Drue Second, MD  Ascorbic Acid (VITAMIN C PO) Take 1 tablet by mouth 2 (two) times daily.    Yes [provider]  atorvastatin (LIPITOR) 20 MG tablet Take 20 mg by mouth at bedtime.    Yes [provider]  calcium carbonate (OSCAL) 1500 (600 Ca) MG TABS tablet Take 600 mg of elemental calcium by mouth  daily.    Yes [provider]  cephALEXin (KEFLEX) 500 MG capsule Take 500 mg by mouth 3 (three) times daily. 10/07/18 10/18/18 Yes [provider]  Cholecalciferol (VITAMIN D3) 50000 units TABS Take 1 tablet by mouth once a week.   Yes [provider]  CINNAMON PO Take 1,000 mg by mouth daily.    Yes [provider]  cloNIDine (CATAPRES) 0.2 MG tablet Take 1 tablet (0.2 mg total) by mouth 2 (two) times daily. Patient taking differently: Take 0.2 mg by mouth daily.  04/03/18  Yes Wieting, Richard, MD  Dulaglutide (TRULICITY) 1.5 KV/4.2VZ SOPN Inject 1.5 mg into the skin every Monday. night   Yes [provider]  fluticasone-salmeterol (ADVAIR HFA) 230-21 MCG/ACT inhaler Inhale 2 puffs into the lungs 2 (two) times daily.   Yes [provider]  folic acid (FOLVITE) 563 MCG tablet Take 800 mcg by mouth daily.    Yes [provider]  furosemide (LASIX) 20 MG tablet Take 20 mg by mouth daily as needed for edema.    Yes [provider]  levothyroxine (SYNTHROID, LEVOTHROID) 112 MCG tablet Take 112 mcg daily before breakfast by mouth.   Yes [provider]  predniSONE (STERAPRED UNI-PAK 48 TAB) 10 MG (48) TBPK tablet Take by mouth as directed.   Yes [provider]    Allergies Patient has no known allergies.  Family History  Problem Relation Age of Onset  . Diabetes Father   . Hypertension Father   . Parkinson's disease Father   . Hypertension Mother   . Rheum arthritis Mother   . Atrial fibrillation Mother   . Heart failure Mother   . Colon cancer Cousin 66  . Breast cancer Neg Hx     Social History Social History   Tobacco Use  . Smoking status: Never Smoker  . Smokeless tobacco: Never Used  Substance Use Topics  . Alcohol use: No    Alcohol/week: 0.0 standard drinks  . Drug use: No    Review of Systems  Constitutional: Negative for fever. Eyes: Negative for visual changes. ENT: Negative  for sore throat. Neck: No neck pain  Cardiovascular: Negative for chest pain. + tachycardia Respiratory: + shortness of breath, cough, wheezing Gastrointestinal: Negative for abdominal pain, vomiting or diarrhea. Genitourinary: Negative for dysuria. Musculoskeletal: Negative for back pain. Skin: Negative for rash. Neurological: Negative for headaches, weakness or numbness. Psych: No SI or HI  ____________________________________________   PHYSICAL EXAM:  VITAL SIGNS: ED Triage Vitals  Enc Vitals Group     BP 10/17/18 2258 (!) 152/85     Pulse Rate 10/17/18 2258 (!) 113     Resp 10/17/18 2258 20     Temp 10/17/18 2258 98.6 F (37 C)     Temp Source 10/17/18 2258 Oral     SpO2 10/17/18 2258 96 %     Weight 10/17/18 2256 (!) 320 lb (145.2 kg)     Height 10/17/18 2256 5\' 4"  (1.626  m)     Head Circumference --      Peak Flow --      Pain Score 10/17/18 2256 0     Pain Loc --      Pain Edu? --      Excl. in [place]? --     Constitutional: Alert and oriented, mild respiratory distress, morbidly obese HEENT:      Head: Normocephalic and atraumatic.         Eyes: Conjunctivae are normal. Sclera is non-icteric.       Mouth/Throat: Mucous membranes are moist.       Neck: Supple with no signs of meningismus. Cardiovascular: Irregularly irregular rhythm with tachycardic rate. No murmurs, gallops, or rubs. 2+ symmetrical distal pulses are present in all extremities. No JVD. Respiratory: Increased work of breathing, tachypnea, audible wheezing however clear lungs with no wheezing or crackles  gastrointestinal: Obese, non tender, and non distended with positive bowel sounds. No rebound or guarding. Musculoskeletal: Nontender with normal range of motion in all extremities. No edema, cyanosis, or erythema of extremities. Neurologic: Normal speech and language. Face is symmetric. Moving all extremities. No gross focal neurologic deficits are appreciated. Skin: Skin is warm, dry and intact. No  rash noted. Psychiatric: Mood and affect are normal. Speech and behavior are normal.  ____________________________________________   LABS (all labs ordered are listed, but only abnormal results are displayed)  Labs Reviewed  BASIC METABOLIC PANEL - Abnormal; Notable for the following components:      Result Value   Glucose, Bld 218 (*)    BUN 89 (*)    Creatinine, Ser 2.30 (*)    Calcium 8.5 (*)    GFR calc non Af Amer 23 (*)    GFR calc Af Amer 27 (*)    All other components within normal limits  CBC - Abnormal; Notable for the following components:   WBC 18.8 (*)    All other components within normal limits  TROPONIN I (HIGH SENSITIVITY) - Abnormal; Notable for the following components:   Troponin I (High Sensitivity) 108 (*)    All other components within normal limits  TROPONIN I (HIGH SENSITIVITY) - Abnormal; Notable for the following components:   Troponin I (High Sensitivity) 132 (*)    All other components within normal limits  TROPONIN I (HIGH SENSITIVITY) - Abnormal; Notable for the following components:   Troponin I (High Sensitivity) 114 (*)    All other components within normal limits  SARS CORONAVIRUS 2 (HOSPITAL ORDER, [place] [facility])  CULTURE, BLOOD (ROUTINE X 2)  CULTURE, BLOOD (ROUTINE X 2)  LACTIC ACID, PLASMA   ____________________________________________  EKG  ED ECG REPORT I, Rudene Re, the attending physician, personally viewed and interpreted this ECG.  Atrial fibrillation, rate of 116, normal QTC, normal axis, no ST elevations or depressions.  A. fib is new when compared to prior. ____________________________________________  RADIOLOGY  I have personally reviewed the images performed during this visit and I agree with the Radiologist's read.   Interpretation by Radiologist:  Dg Chest 1 View  Result Date: 10/18/2018 CLINICAL DATA:  Shortness of breath, tachycardia, recent diagnosis of bronchitis EXAM: CHEST  1  VIEW COMPARISON:  Radiograph 03/29/2018, CT 10/18/2014 FINDINGS: There is collapse of the left lower lobe. More diffuse airways thickening is seen in the right lung base with hazy interstitial opacities. The aorta is calcified. Cardiomediastinal contours are otherwise unremarkable. Degenerative changes are present in the imaged spine and shoulders. No acute  osseous or soft tissue abnormality IMPRESSION: 1. Left lower lobe collapse. 2. More diffuse airways thickening in the right lung base with hazy interstitial opacities, concerning for bronchopneumonia. Electronically Signed   By: Lovena Le M.D.   On: 10/18/2018 00:49      ____________________________________________   PROCEDURES  Procedure(s) performed: None Procedures Critical Care performed: yes  CRITICAL CARE Performed by: Rudene Re  ?  Total critical care time: 35 min  Critical care time was exclusive of separately billable procedures and treating other patients.  Critical care was necessary to treat or prevent imminent or life-threatening deterioration.  Critical care was time spent personally by me on the following activities: development of treatment plan with patient and/or surrogate as well as nursing, discussions with consultants, evaluation of patient's response to treatment, examination of patient, obtaining history from patient or surrogate, ordering and performing treatments and interventions, ordering and review of laboratory studies, ordering and review of radiographic studies, pulse oximetry and re-evaluation of patient's condition.  ____________________________________________   INITIAL IMPRESSION / ASSESSMENT AND PLAN / ED COURSE  53 y.o. female with a history of diastolic CHF, anemia, breast cancer in remission, diabetes, chronic kidney disease, PE on Eliquis, asthma who presents for evaluation of shortness of breath and tachycardia.  Patient is currently on her second round of prednisone and  antibiotics for bronchitis in the last 2 weeks.  She is in mild respiratory distress with increased work of breathing, tachypnea and audible wheezing however lungs sound clear.  EKG shows new atrial fibrillation with a rate in the 110s, otherwise hemodynamically stable with good mental status.  New A. fib possibly from CHF exacerbation versus prednisone use versus albuterol use in the setting of acute bronchitis.  Patient denies hemoptysis, chest pain, leg pain or swelling, and is not missed any doses of her Eliquis therefore less likely PE, will hold off CTA due to kidney injury and continue Eliquis.  Labs showing acute on chronic kidney injury. Leukocytosis and hyperglycemia with no signs of DKA.  CXR concerning for PNA. Patient given IV rocephin and doxy. Possibly early sepsis with tachycardia, tachypnea, leukocytosis (although this could be from prednisone use). Lactic acid is pending. Troponin is elevated at 108 concerning for demand ischemia. Will give 500cc of IVF, start patient on PO cardizem. No need for IV cardizem at this time with HR in the 110s. COVD swab pending.      As part of my medical decision making, I reviewed the following data within the [place] notes reviewed and incorporated, Labs reviewed , EKG interpreted , Old EKG reviewed, Old chart reviewed, Radiograph reviewed , Discussed with admitting physician , Notes from prior ED visits and [place] Controlled Substance Database   Patient was evaluated in Emergency Department today for the symptoms described in the history of present illness. Patient was evaluated in the context of the global COVID-19 pandemic, which necessitated consideration that the patient might be at risk for infection with the SARS-CoV-2 virus that causes COVID-19. Institutional protocols and algorithms that pertain to the evaluation of patients at risk for COVID-19 are in a state of rapid change based on information released by regulatory  bodies including the CDC and federal and state organizations. These policies and algorithms were followed during the patient's care in the ED.   ____________________________________________   FINAL CLINICAL IMPRESSION(S) / ED DIAGNOSES   Final diagnoses:  Atrial fibrillation with RVR ([employer])  Demand ischemia ([place])  Acute kidney injury superimposed on chronic  kidney disease ([place])  Community acquired pneumonia, unspecified laterality  Sepsis with acute renal failure without septic shock, due to unspecified organism, unspecified acute renal failure type ([place])      NEW MEDICATIONS STARTED DURING THIS VISIT:  ED Discharge Orders    None       Note:  This document was prepared using Dragon voice recognition software and may include unintentional dictation errors.    Alfred Levins, [place], MD 10/18/18 [id number]

## 2018-10-18 NOTE — ED Notes (Signed)
.. ED TO INPATIENT HANDOFF REPORT  ED Nurse Name and Phone #: Valetta Fuller [phone]  S Name/Age/Gender Nicole Schmidt 53 y.o. female Room/Bed: ED10A/ED10A  Code Status   Code Status: Prior  Home/SNF/Other Home Patient oriented to: self, place, time and situation Is this baseline? Yes   Triage Complete: Triage complete  Chief Complaint Shob/heart racing  Triage Note Pt arrives to ED via POV from home with c/o [facility] and tachycardia. Pt states recent d/x of bronchitis; reports taking antibiotics and steriod as prescribed. Pt states her HR at home was 113-147 bpm. Pt denies CP; no fever; no N/V/D. Pt is A&O, in NAD; RR even, regular, and unlabored.   Allergies No Known Allergies  Level of Care/Admitting Diagnosis ED Disposition    ED Disposition Condition Comment   Admit  The patient appears reasonably stabilized for admission considering the current resources, flow, and capabilities available in the ED at this time, and I doubt any other [facility] requiring further screening and/or treatment in the ED prior to admission is  present.       B Medical/Surgery History Past Medical History:  Diagnosis Date  . Anemia   . Arthritis   . Breast cancer of upper-inner quadrant of right female breast ([place])    Right breast invasive CA and DCIS , 7 mm T1,N0,M0. Er/PR pos, her 2 negative.  Margins 1 mm.  . CHF (congestive heart failure) ([place])   . Diabetes mellitus without complication ([place])   . Gout   . Gout   . Hypertension   . Menopause    age 8  . Personal history of radiation therapy   . Pulmonary embolism ([place]) 10/2014  . Renal insufficiency   . Thyroid goiter    Past Surgical History:  Procedure Laterality Date  . BREAST BIOPSY Right 2014   breast ca  . BREAST EXCISIONAL BIOPSY Right 07/19/2012   breast ca  . BREAST SURGERY Right 2014   with sentinel node bx subareaolar duct excision  . COLONOSCOPY WITH PROPOFOL N/A 01/30/2017   Procedure: COLONOSCOPY WITH PROPOFOL;  Surgeon:  Christene Lye, MD;  Location: [facility] ENDOSCOPY;  Service: Endoscopy;  Laterality: N/A;     A IV Location/Drains/Wounds Patient Lines/Drains/Airways Status   Active Line/Drains/Airways    Name:   Placement date:   Placement time:   Site:   Days:   Peripheral IV 10/18/18 Left Antecubital   10/18/18    0100    Antecubital   less than 1   Peripheral IV 10/18/18 Right;Anterior Forearm   10/18/18    0236    Forearm   less than 1   Incision (Closed) 11/20/17 Neck Right;Lateral   11/20/17    2000     332          Intake/Output Last 24 hours No intake or output data in the 24 hours ending 10/18/18 0248  Labs/Imaging Results for orders placed or performed during the hospital encounter of 10/18/18 (from the past 48 hour(s))  Basic metabolic panel     Status: Abnormal   Collection Time: 10/17/18 11:10 PM  Result Value Ref Range   Sodium 141 135 - 145 mmol/L   Potassium 4.0 3.5 - 5.1 mmol/L   Chloride 107 98 - 111 mmol/L   CO2 23 22 - 32 mmol/L   Glucose, Bld 218 (H) 70 - 99 mg/dL   BUN 89 (H) 6 - 20 mg/dL   Creatinine, Ser 2.30 (H) 0.44 - 1.00 mg/dL   Calcium 8.5 (  L) 8.9 - 10.3 mg/dL   GFR calc non Af Amer 23 (L) >60 mL/min   GFR calc Af Amer 27 (L) >60 mL/min   Anion gap 11 5 - 15    Comment: Performed at [facility], [address] [place], [place] [address]  CBC     Status: Abnormal   Collection Time: 10/17/18 11:10 PM  Result Value Ref Range   WBC 18.8 (H) 4.0 - 10.5 K/uL   RBC 5.04 3.87 - 5.11 MIL/uL   Hemoglobin 14.3 12.0 - 15.0 g/dL   HCT 44.9 36.0 - 46.0 %   MCV 89.1 80.0 - 100.0 fL   MCH 28.4 26.0 - 34.0 pg   MCHC 31.8 30.0 - 36.0 g/dL   RDW 13.3 11.5 - 15.5 %   Platelets 265 150 - 400 K/uL   nRBC 0.0 0.0 - 0.2 %    Comment: Performed at [facility], [address], [place], [place] [address]  Troponin I (High Sensitivity)     Status: Abnormal   Collection Time: 10/17/18 11:10 PM  Result Value Ref Range   Troponin I (High Sensitivity)  108 (HH) <18 ng/L    Comment: CRITICAL RESULT CALLED TO, READ BACK BY AND VERIFIED WITH LISA THOMPSON ON 10/17/18 AT 2350 [place] (NOTE) Elevated high sensitivity troponin I (hsTnI) values and significant  changes across serial measurements may suggest ACS but many other  chronic and acute conditions are known to elevate hsTnI results.  Refer to the "Links" section for chest pain algorithms and additional  guidance. Performed at [facility], [address] [place], [place] [address]   SARS Coronavirus 2 [facility] order, Performed in [facility] hospital lab) Nasopharyngeal Nasopharyngeal Swab     Status: None   Collection Time: 10/18/18  1:06 AM   Specimen: Nasopharyngeal Swab  Result Value Ref Range   SARS Coronavirus 2 NEGATIVE NEGATIVE    Comment: (NOTE) If result is NEGATIVE SARS-CoV-2 target nucleic acids are NOT DETECTED. The SARS-CoV-2 RNA is generally detectable in upper and lower  respiratory specimens during the acute phase of infection. The lowest  concentration of SARS-CoV-2 viral copies this assay can detect is 250  copies / mL. A negative result does not preclude SARS-CoV-2 infection  and should not be used as the sole basis for treatment or other  patient management decisions.  A negative result may occur with  improper specimen collection / handling, submission of specimen other  than nasopharyngeal swab, presence of viral mutation(s) within the  areas targeted by this assay, and inadequate number of viral copies  (<250 copies / mL). A negative result must be combined with clinical  observations, patient history, and epidemiological information. If result is POSITIVE SARS-CoV-2 target nucleic acids are DETECTED. The SARS-CoV-2 RNA is generally detectable in upper and lower  respiratory specimens dur ing the acute phase of infection.  Positive  results are indicative of active infection with SARS-CoV-2.  Clinical  correlation with patient history and other  diagnostic information is  necessary to determine patient infection status.  Positive results do  not rule out bacterial infection or co-infection with other viruses. If result is PRESUMPTIVE POSTIVE SARS-CoV-2 nucleic acids MAY BE PRESENT.   A presumptive positive result was obtained on the submitted specimen  and confirmed on repeat testing.  While 2019 novel coronavirus  (SARS-CoV-2) nucleic acids may be present in the submitted sample  additional confirmatory testing may be necessary for epidemiological  and / or clinical  management purposes  to differentiate between  SARS-CoV-2 and other Sarbecovirus currently known to infect humans.  If clinically indicated additional testing with an alternate test  methodology [phone]) is advised. The SARS-CoV-2 RNA is generally  detectable in upper and lower respiratory sp ecimens during the acute  phase of infection. The expected result is Negative. Fact Sheet for Patients:  StrictlyIdeas.no Fact Sheet for Healthcare Providers: BankingDealers.co.za This test is not yet approved or cleared by the [place] FDA and has been authorized for detection and/or diagnosis of SARS-CoV-2 by FDA under an Emergency Use Authorization (EUA).  This EUA will remain in effect (meaning this test can be used) for the duration of the COVID-19 declaration under Section 564(b)(1) of the Act, 21 U.S.C. section 360bbb-3(b)(1), unless the authorization is terminated or revoked sooner. Performed at [facility], [address] [place], [place] [address]   Troponin I (High Sensitivity)     Status: Abnormal   Collection Time: 10/18/18  1:06 AM  Result Value Ref Range   Troponin I (High Sensitivity) 132 (HH) <18 ng/L    Comment: CRITICAL RESULT CALLED TO, READ BACK BY AND VERIFIED WITH KATIE SPOUSE ON 10/18/18 AT 0145 [place] (NOTE) Elevated high sensitivity troponin I (hsTnI) values and significant  changes  across serial measurements may suggest ACS but many other  chronic and acute conditions are known to elevate hsTnI results.  Refer to the "Links" section for chest pain algorithms and additional  guidance. Performed at [facility], [address] [place], [place] [address]   Lactic acid, plasma     Status: None   Collection Time: 10/18/18  2:07 AM  Result Value Ref Range   Lactic Acid, Venous 1.4 0.5 - 1.9 mmol/L    Comment: Performed at [facility], [address] [place],  [address]   Dg Chest 1 View  Result Date: 10/18/2018 CLINICAL DATA:  Shortness of breath, tachycardia, recent diagnosis of bronchitis EXAM: CHEST  1 VIEW COMPARISON:  Radiograph 03/29/2018, CT 10/18/2014 FINDINGS: There is collapse of the left lower lobe. More diffuse airways thickening is seen in the right lung base with hazy interstitial opacities. The aorta is calcified. Cardiomediastinal contours are otherwise unremarkable. Degenerative changes are present in the imaged spine and shoulders. No acute osseous or soft tissue abnormality IMPRESSION: 1. Left lower lobe collapse. 2. More diffuse airways thickening in the right lung base with hazy interstitial opacities, concerning for bronchopneumonia. Electronically Signed   By: Lovena Le M.D.   On: 10/18/2018 00:49    Pending Labs Unresulted Labs (From admission, onward)    Start     Ordered   10/18/18 0125  Blood culture (routine x 2)  BLOOD CULTURE X 2,   STAT     10/18/18 0124          Vitals/Pain Today's Vitals   10/17/18 2256 10/17/18 2258  BP:  (!) 152/85  Pulse:  (!) 113  Resp:  20  Temp:  98.6 F (37 C)  TempSrc:  Oral  SpO2:  96%  Weight: (!) 145.2 kg   Height: 5\' 4"  (1.626 m)   PainSc: 0-No pain     Isolation Precautions No active isolations  Medications Medications  cefTRIAXone (ROCEPHIN) 1 g in sodium chloride 0.9 % 100 mL IVPB (has no administration in time range)  diltiazem (CARDIZEM CD) 24 hr capsule  180 mg (180 mg Oral Given 10/18/18 0231)  doxycycline (VIBRA-TABS) tablet 100 mg (100 mg Oral Given 10/18/18 0145)  Mobility walks Low fall risk   Focused Assessments Cardiac Assessment Handoff:    Lab Results  Component Value Date   TROPONINI 0.04 ([place]) 01/12/2018   No results found for: DDIMER Does the Patient currently have chest pain? No     R Recommendations: See Admitting Provider Note  Report given to:   Additional Notes:

## 2018-10-19 LAB — CBC
HCT: 41.8 % (ref 36.0–46.0)
Hemoglobin: 12.9 g/dL (ref 12.0–15.0)
MCH: 28.3 pg (ref 26.0–34.0)
MCHC: 30.9 g/dL (ref 30.0–36.0)
MCV: 91.7 fL (ref 80.0–100.0)
Platelets: 221 10*3/uL (ref 150–400)
RBC: 4.56 MIL/uL (ref 3.87–5.11)
RDW: 13.4 % (ref 11.5–15.5)
WBC: 14.9 10*3/uL — ABNORMAL HIGH (ref 4.0–10.5)
nRBC: 0 % (ref 0.0–0.2)

## 2018-10-19 LAB — BASIC METABOLIC PANEL
Anion gap: 8 (ref 5–15)
BUN: 66 mg/dL — ABNORMAL HIGH (ref 6–20)
CO2: 25 mmol/L (ref 22–32)
Calcium: 8.3 mg/dL — ABNORMAL LOW (ref 8.9–10.3)
Chloride: 111 mmol/L (ref 98–111)
Creatinine, Ser: 1.57 mg/dL — ABNORMAL HIGH (ref 0.44–1.00)
GFR calc Af Amer: 43 mL/min — ABNORMAL LOW (ref >60)
GFR calc non Af Amer: 37 mL/min — ABNORMAL LOW (ref >60)
Glucose, Bld: 192 mg/dL — ABNORMAL HIGH (ref 70–99)
Potassium: 4.2 mmol/L (ref 3.5–5.1)
Sodium: 144 mmol/L (ref 135–145)

## 2018-10-19 LAB — GLUCOSE, CAPILLARY
Glucose-Capillary: 156 mg/dL — ABNORMAL HIGH (ref 70–99)
Glucose-Capillary: 195 mg/dL — ABNORMAL HIGH (ref 70–99)
Glucose-Capillary: 206 mg/dL — ABNORMAL HIGH (ref 70–99)
Glucose-Capillary: 229 mg/dL — ABNORMAL HIGH (ref 70–99)

## 2018-10-19 LAB — T4, FREE: Free T4: 0.87 ng/dL (ref 0.61–1.12)

## 2018-10-19 MED ORDER — SODIUM CHLORIDE 0.9% FLUSH: 3.0000 mL | INTRAVENOUS | Status: DC | PRN

## 2018-10-19 MED ORDER — SODIUM CHLORIDE 0.9% FLUSH
3.0000 mL | Freq: Two times a day (BID) | INTRAVENOUS | Status: DC
  Administered 2018-10-19 (×2): 3 mL via INTRAVENOUS

## 2018-10-19 MED ORDER — METOPROLOL TARTRATE 25 MG PO TABS
25.0000 mg | ORAL_TABLET | Freq: Two times a day (BID) | ORAL | Status: DC
  Administered 2018-10-19 – 2018-10-20 (×3): 25 mg via ORAL
  Filled 2018-10-19 (×3): qty 1

## 2018-10-19 MED ORDER — AZITHROMYCIN 250 MG PO TABS
500.0000 mg | ORAL_TABLET | Freq: Every day | ORAL | Status: AC
  Administered 2018-10-20: 09:00:00 500 mg via ORAL
  Filled 2018-10-19: qty 2

## 2018-10-19 NOTE — Progress Notes (Addendum)
She walked from room 260 to room 259.  Her O2 sats were stable at 94 - 95% on room air.  Her heart rate max'd at 150.  Due to her HR we returned to her room.

## 2018-10-19 NOTE — Progress Notes (Addendum)
[place] at [place] NAME: Nicole Schmidt    MR#:  [id number]  DATE OF BIRTH:  Mar 02, 1965  SUBJECTIVE:   Patient states she is feeling a little bit better this morning.  She endorses continued purulent cough.  Still feels a little bit short of breath, but only when she moves around.  She was able to be weaned to room air today.  She denies any chest pain or palpitations.  REVIEW OF SYSTEMS:  Review of Systems  Constitutional: Negative for chills and fever.  HENT: Negative for congestion and sore throat.   Eyes: Negative for blurred vision and double vision.  Respiratory: Positive for cough and shortness of breath.   Cardiovascular: Negative for chest pain and palpitations.  Gastrointestinal: Negative for nausea and vomiting.  Genitourinary: Negative for dysuria and urgency.  Musculoskeletal: Negative for back pain and neck pain.  Neurological: Negative for dizziness and headaches.  Psychiatric/Behavioral: Negative for depression. The patient is not nervous/anxious.     DRUG ALLERGIES:  No Known Allergies VITALS:  Blood pressure (!) 158/87, pulse 84, temperature 97.8 F (36.6 C), temperature source Oral, resp. rate 16, height 5\' 4"  (1.626 m), weight (!) 153 kg, last menstrual period 07/17/2012, SpO2 95 %. PHYSICAL EXAMINATION:  Physical Exam  GENERAL:  Laying in the bed with no acute distress.  HEENT: Head atraumatic, normocephalic. Pupils equal, round, reactive to light and accommodation. No scleral icterus. Extraocular muscles intact. Oropharynx and nasopharynx clear.  NECK:  Supple, no jugular venous distention. No thyroid enlargement. LUNGS: +diminshed breath sounds in the lung bases bilaterally. No use of accessory muscles of respiration.  CARDIOVASCULAR: Irregularly irregular rhythm, regular rate, S1, S2 normal. No murmurs, rubs, or gallops.  ABDOMEN: Soft, nontender, nondistended. Bowel sounds present.  EXTREMITIES: No pedal edema,  cyanosis, or clubbing.  NEUROLOGIC: CN 2-12 intact, no focal deficits. 5/5 muscle strength throughout all extremities. Sensation intact throughout. Gait not checked.  PSYCHIATRIC: The patient is alert and oriented x 3.  SKIN: No obvious rash, lesion, or ulcer.  LABORATORY PANEL:  Female CBC Recent Labs  Lab 10/19/18 0807  WBC 14.9*  HGB 12.9  HCT 41.8  PLT 221   ------------------------------------------------------------------------------------------------------------------ Chemistries  Recent Labs  Lab 10/19/18 0807  NA 144  K 4.2  CL 111  CO2 25  GLUCOSE 192*  BUN 66*  CREATININE 1.57*  CALCIUM 8.3*   RADIOLOGY:  No results found. ASSESSMENT AND PLAN:   Community-acquired pneumonia with possible asthma exacerbation- patient initially requiring oxygen, but was able to be weaned to room air today.  Leukocytosis improving. -Continue ceftriaxone -Switch azithromycin to p.o. -Continue prednisone -Continue home inhalers  New onset atrial fibrillation-initially in RVR, but this has resolved spontaneously. -Continue Eliquis -Start metoprolol bid -Cardiac monitoring  AKI in CKD III- AKI has resolved. Creatinine has returned to baseline. -Continue IV fluids -Avoid nephrotoxic agents  Hypertension -Continue home clonidine and Norvasc -Add metoprolol, as above  Hypothyroidism-TSH was elevated, but T4 was normal this admission. -Continue home Synthroid -Recheck thyroid studies as an outpatient  Hyperlipidemia-stable -Continue home statin  DVT prophylaxis-Eliquis  All the records are reviewed and case discussed with Care Management/Social Worker. Management plans discussed with the patient, family and they are in agreement.  CODE STATUS: Full Code  TOTAL TIME TAKING CARE OF THIS PATIENT: 45 minutes.   More than 50% of the time was spent in counseling/coordination of care: YES  POSSIBLE D/C IN 1-2 DAYS, DEPENDING ON CLINICAL CONDITION.  Berna Spare Dennisse Swader M.D on  10/19/2018 at 4:19 PM  Between 7am to 6pm - Pager - [phone]  After 6pm go to [email or web address] - Proofreader  [facility]  Office  [phone]  CC: Primary care physician; Lenard Simmer, MD  Note: This dictation was prepared with Dragon dictation along with smaller phrase technology. Any transcriptional errors that result from this process are unintentional.

## 2018-10-20 ENCOUNTER — Inpatient Hospital Stay: Payer: BC Managed Care – PPO

## 2018-10-20 ENCOUNTER — Inpatient Hospital Stay (HOSPITAL_COMMUNITY)
Admit: 2018-10-20 | Discharge: 2018-10-20 | Disposition: A | Discharge status: Home / Self Care | Payer: BC Managed Care – PPO | Attending: Internal Medicine | Admitting: Internal Medicine

## 2018-10-20 DIAGNOSIS — I361 Nonrheumatic tricuspid (valve) insufficiency: Secondary | ICD-10-CM

## 2018-10-20 DIAGNOSIS — I340 Nonrheumatic mitral (valve) insufficiency: Secondary | ICD-10-CM

## 2018-10-20 LAB — BASIC METABOLIC PANEL
Anion gap: 9 (ref 5–15)
BUN: 65 mg/dL — ABNORMAL HIGH (ref 6–20)
CO2: 24 mmol/L (ref 22–32)
Calcium: 8.1 mg/dL — ABNORMAL LOW (ref 8.9–10.3)
Chloride: 111 mmol/L (ref 98–111)
Creatinine, Ser: 1.55 mg/dL — ABNORMAL HIGH (ref 0.44–1.00)
GFR calc Af Amer: 43 mL/min — ABNORMAL LOW (ref >60)
GFR calc non Af Amer: 37 mL/min — ABNORMAL LOW (ref >60)
Glucose, Bld: 162 mg/dL — ABNORMAL HIGH (ref 70–99)
Potassium: 4.1 mmol/L (ref 3.5–5.1)
Sodium: 144 mmol/L (ref 135–145)

## 2018-10-20 LAB — ECHOCARDIOGRAM COMPLETE
Height: 64 in
Weight: 5478.4 oz

## 2018-10-20 LAB — CBC
HCT: 43.5 % (ref 36.0–46.0)
Hemoglobin: 13.6 g/dL (ref 12.0–15.0)
MCH: 28.5 pg (ref 26.0–34.0)
MCHC: 31.3 g/dL (ref 30.0–36.0)
MCV: 91.2 fL (ref 80.0–100.0)
Platelets: 210 10*3/uL (ref 150–400)
RBC: 4.77 MIL/uL (ref 3.87–5.11)
RDW: 13.2 % (ref 11.5–15.5)
WBC: 14.7 10*3/uL — ABNORMAL HIGH (ref 4.0–10.5)
nRBC: 0 % (ref 0.0–0.2)

## 2018-10-20 LAB — GLUCOSE, CAPILLARY
Glucose-Capillary: 129 mg/dL — ABNORMAL HIGH (ref 70–99)
Glucose-Capillary: 174 mg/dL — ABNORMAL HIGH (ref 70–99)
Glucose-Capillary: 311 mg/dL — ABNORMAL HIGH (ref 70–99)
Glucose-Capillary: 196 mg/dL — ABNORMAL HIGH (ref 70–99)

## 2018-10-20 MED ORDER — TRAZODONE HCL 50 MG PO TABS
50.0000 mg | ORAL_TABLET | Freq: Every evening | ORAL | Status: DC | PRN
  Administered 2018-10-21: 50 mg via ORAL
  Filled 2018-10-20: qty 1

## 2018-10-20 MED ORDER — SODIUM CHLORIDE 0.9 % IV SOLN
INTRAVENOUS | Status: DC | PRN
  Administered 2018-10-21: 10 mL via INTRAVENOUS

## 2018-10-20 MED ORDER — FUROSEMIDE 10 MG/ML IJ SOLN
40.0000 mg | Freq: Once | INTRAMUSCULAR | Status: AC
  Administered 2018-10-20: 40 mg via INTRAVENOUS
  Filled 2018-10-20: qty 4

## 2018-10-20 NOTE — Progress Notes (Signed)
[place] at [place] NAME: Nicole Schmidt    MR#:  [id number]  DATE OF BIRTH:  31-Dec-1965  SUBJECTIVE:   Patient continues to have pretty significant dyspnea on exertion this morning.  She states whenever she tries to get up and walk around, she feels very winded.  She also notes worsening lower extremity edema.  She has had a 5 pound weight gain from yesterday to today.  REVIEW OF SYSTEMS:  Review of Systems  Constitutional: Negative for chills and fever.  HENT: Negative for congestion and sore throat.   Eyes: Negative for blurred vision and double vision.  Respiratory: Positive for cough and shortness of breath.   Cardiovascular: Positive for leg swelling. Negative for chest pain and palpitations.  Gastrointestinal: Negative for nausea and vomiting.  Genitourinary: Negative for dysuria and urgency.  Musculoskeletal: Negative for back pain and neck pain.  Neurological: Negative for dizziness and headaches.  Psychiatric/Behavioral: Negative for depression. The patient is not nervous/anxious.     DRUG ALLERGIES:  No Known Allergies VITALS:  Blood pressure (!) 167/64, pulse 73, temperature 98.2 F (36.8 C), temperature source Oral, resp. rate 18, height 5\' 4"  (1.626 m), weight (!) 155.3 kg, last menstrual period 07/17/2012, SpO2 97 %. PHYSICAL EXAMINATION:  Physical Exam  GENERAL:  Laying in the bed with no acute distress.  HEENT: Head atraumatic, normocephalic. Pupils equal, round, reactive to light and accommodation. No scleral icterus. Extraocular muscles intact. Oropharynx and nasopharynx clear.  NECK:  Supple, no jugular venous distention. No thyroid enlargement. LUNGS: +diminshed breath sounds in the lung bases bilaterally. No use of accessory muscles of respiration.  CARDIOVASCULAR: Irregularly irregular rhythm, regular rate, S1, S2 normal. No murmurs, rubs, or gallops.  ABDOMEN: Soft, nontender, nondistended. Bowel sounds present.   EXTREMITIES: No cyanosis, or clubbing. 1+ pitting edema bilaterally NEUROLOGIC: CN 2-12 intact, no focal deficits. 5/5 muscle strength throughout all extremities. Sensation intact throughout. Gait not checked.  PSYCHIATRIC: The patient is alert and oriented x 3.  SKIN: No obvious rash, lesion, or ulcer.  LABORATORY PANEL:  Female CBC Recent Labs  Lab 10/20/18 0411  WBC 14.7*  HGB 13.6  HCT 43.5  PLT 210   ------------------------------------------------------------------------------------------------------------------ Chemistries  Recent Labs  Lab 10/20/18 0411  NA 144  K 4.1  CL 111  CO2 24  GLUCOSE 162*  BUN 65*  CREATININE 1.55*  CALCIUM 8.1*   RADIOLOGY:  Dg Chest 1 View  Result Date: 10/20/2018 CLINICAL DATA:  SOB and cough. Hx of pulmonary embolism , HTN, diabetes, CHF, breast cancer-2014. EXAM: CHEST  1 VIEW COMPARISON:  Is 10/18/2018 FINDINGS: The heart is enlarged. There is atherosclerotic calcification of the thoracic aorta. There is persistent LEFT LOWER lobe collapse and obscured LEFT hemidiaphragm. No evidence for pulmonary edema. There is mild perihilar peribronchial thickening, stable in appearance. IMPRESSION: Stable LEFT LOWER lobe collapse and obscured LEFT hemidiaphragm. Electronically Signed   By: Nolon Nations M.D.   On: 10/20/2018 12:08   ASSESSMENT AND PLAN:   Community-acquired pneumonia with possible asthma exacerbation- patient has been stable on room air. No signs of sepsis. -Continue ceftriaxone and azithromycin -Continue prednisone -Continue home inhalers  Acute on chronic diastolic CHF- she has worsening LE edema, DOE, and 5lb weight gain noted today. -Start lasix 40mg  IV daily -Check ECHO -Continue metoprolol  New onset atrial fibrillation-initially in RVR, but this has resolved spontaneously. -Continue Eliquis -Continue metoprolol bid -Cardiac monitoring  CKD III- initially with AKI, but  this has resolved. -Continue IV fluids  -Avoid nephrotoxic agents  Hypertension -Continue home clonidine and Norvasc -Started on metoprolol this admission  Hypothyroidism-TSH was elevated, but T4 was normal this admission. -Continue home Synthroid -Recheck thyroid studies as an outpatient  Hyperlipidemia-stable -Continue home statin  DVT prophylaxis-Eliquis  All the records are reviewed and case discussed with Care Management/Social Worker. Management plans discussed with the patient, family and they are in agreement.  CODE STATUS: Full Code  TOTAL TIME TAKING CARE OF THIS PATIENT: 45 minutes.   More than 50% of the time was spent in counseling/coordination of care: YES  POSSIBLE D/C IN 1-2 DAYS, DEPENDING ON CLINICAL CONDITION.   Berna Spare Mayo M.D on 10/20/2018 at 1:20 PM  Between 7am to 6pm - Pager - [phone]  After 6pm go to [email or web address] - Proofreader  [facility] [place]  Office  [phone]  CC: Primary care physician; Lenard Simmer, MD  Note: This dictation was prepared with Dragon dictation along with smaller phrase technology. Any transcriptional errors that result from this process are unintentional.

## 2018-10-20 NOTE — Plan of Care (Signed)
  Problem: Education: Goal: Knowledge of General Education information will improve Description Including pain rating scale, medication(s)/side effects and non-pharmacologic comfort measures Outcome: Progressing   Problem: Health Behavior/Discharge Planning: Goal: Ability to manage health-related needs will improve Outcome: Progressing   

## 2018-10-20 NOTE — Progress Notes (Signed)
*  PRELIMINARY RESULTS* Echocardiogram 2D Echocardiogram has been performed.  Nicole Schmidt Aliyanna Wassmer 10/20/2018, 3:39 PM

## 2018-10-20 NOTE — Progress Notes (Addendum)
Pt is requesting something for sleep. Notify prime. Will continue to monitor.  Update 2208: Trazodone 50 mg PRN at bedtime. Will continue to monitor.

## 2018-10-21 LAB — CBC
HCT: 42 % (ref 36.0–46.0)
Hemoglobin: 13.3 g/dL (ref 12.0–15.0)
MCH: 28.5 pg (ref 26.0–34.0)
MCHC: 31.7 g/dL (ref 30.0–36.0)
MCV: 90.1 fL (ref 80.0–100.0)
Platelets: 199 10*3/uL (ref 150–400)
RBC: 4.66 MIL/uL (ref 3.87–5.11)
RDW: 13.3 % (ref 11.5–15.5)
WBC: 13.8 10*3/uL — ABNORMAL HIGH (ref 4.0–10.5)
nRBC: 0 % (ref 0.0–0.2)

## 2018-10-21 LAB — T3: T3, Total: 55 ng/dL — ABNORMAL LOW (ref 71–180)

## 2018-10-21 LAB — GLUCOSE, CAPILLARY
Glucose-Capillary: 141 mg/dL — ABNORMAL HIGH (ref 70–99)
Glucose-Capillary: 152 mg/dL — ABNORMAL HIGH (ref 70–99)
Glucose-Capillary: 172 mg/dL — ABNORMAL HIGH (ref 70–99)

## 2018-10-21 LAB — BASIC METABOLIC PANEL
Anion gap: 6 (ref 5–15)
BUN: 59 mg/dL — ABNORMAL HIGH (ref 6–20)
CO2: 27 mmol/L (ref 22–32)
Calcium: 8.1 mg/dL — ABNORMAL LOW (ref 8.9–10.3)
Chloride: 109 mmol/L (ref 98–111)
Creatinine, Ser: 1.45 mg/dL — ABNORMAL HIGH (ref 0.44–1.00)
GFR calc Af Amer: 47 mL/min — ABNORMAL LOW (ref >60)
GFR calc non Af Amer: 40 mL/min — ABNORMAL LOW (ref >60)
Glucose, Bld: 216 mg/dL — ABNORMAL HIGH (ref 70–99)
Potassium: 3.9 mmol/L (ref 3.5–5.1)
Sodium: 142 mmol/L (ref 135–145)

## 2018-10-21 MED ORDER — FUROSEMIDE 20 MG PO TABS
20.0000 mg | ORAL_TABLET | Freq: Every day | ORAL | Status: DC
  Administered 2018-10-21: 20 mg via ORAL
  Filled 2018-10-21: qty 1

## 2018-10-21 MED ORDER — METOPROLOL TARTRATE 50 MG PO TABS: 50.0000 mg | ORAL_TABLET | Freq: Two times a day (BID) | ORAL | 0 refills | Status: DC

## 2018-10-21 MED ORDER — METOPROLOL TARTRATE 50 MG PO TABS
50.0000 mg | ORAL_TABLET | Freq: Two times a day (BID) | ORAL | Status: DC
  Administered 2018-10-21: 50 mg via ORAL
  Filled 2018-10-21: qty 1

## 2018-10-21 MED ORDER — CLONIDINE HCL 0.2 MG PO TABS: 0.2000 mg | ORAL_TABLET | Freq: Two times a day (BID) | ORAL | 0 refills | Status: DC

## 2018-10-21 MED ORDER — PREDNISONE 50 MG PO TABS: 50.0000 mg | ORAL_TABLET | Freq: Every day | ORAL | 0 refills | Status: AC

## 2018-10-21 MED ORDER — DOXYCYCLINE MONOHYDRATE 100 MG PO TABS: 100.0000 mg | ORAL_TABLET | Freq: Two times a day (BID) | ORAL | 0 refills | Status: DC

## 2018-10-21 NOTE — Discharge Summary (Signed)
[facility] NAME: Nicole Schmidt    MR#:  [id number]  DATE OF BIRTH:  1965-03-07  DATE OF ADMISSION:  10/18/2018   ADMITTING PHYSICIAN: Harrie Foreman, MD  DATE OF DISCHARGE: 10/21/18  PRIMARY CARE PHYSICIAN: Lenard Simmer, MD   ADMISSION DIAGNOSIS:  SOB (shortness of breath) [R06.02] Demand ischemia (HCC) [I24.8] Atrial fibrillation with RVR ([place]) [I48.91] Acute kidney injury superimposed on chronic kidney disease ([place]) [N17.9, N18.9] Community acquired pneumonia, unspecified laterality [J18.9] Sepsis with acute renal failure without septic shock, due to unspecified organism, unspecified acute renal failure type ([place]) [A41.9, R65.20, N17.9] DISCHARGE DIAGNOSIS:  Active Problems:   Atrial fibrillation with RVR ([place])  SECONDARY DIAGNOSIS:   Past Medical History:  Diagnosis Date  . Anemia   . Arthritis   . Breast cancer of upper-inner quadrant of right female breast ([place])    Right breast invasive CA and DCIS , 7 mm T1,N0,M0. Er/PR pos, her 2 negative.  Margins 1 mm.  . CHF (congestive heart failure) ([place])   . Diabetes mellitus without complication ([place])   . Gout   . Gout   . Hypertension   . Menopause    age 16  . Personal history of radiation therapy   . Pulmonary embolism ([place]) 10/2014  . Renal insufficiency   . Thyroid goiter    HOSPITAL COURSE:   Nicole Schmidt is a 53 year old female who presented to the ED with shortness of breath and "feeling funny".  She also noted that her heart rate was up to 130 bpm at home.  In the ED, she was found to be in A. fib with RVR.  Also noted to have a community-acquired pneumonia.  She was admitted for further management.  Community-acquired pneumonia with possible asthma exacerbation- no signs of sepsis. -Treated with ceftriaxone and azithromycin and transition to doxycycline 100 mg twice daily to complete a total 5-day course of antibiotics -Treated with a 5-day course of  steroids -Continued home inhalers -Patient was ambulated with pulse ox on the day of discharge and was able to maintain her O2 saturations at 93%  Acute on chronic diastolic CHF- occurred during this hospitalization, likely due to receiving IV fluids in the ED and being in A-fib. -Given Lasix 40 mg IV x1 and had 4 L UOP -ECHO with EF 60-65% and LV pseudonormalization -Started on metoprolol this admission  New onset atrial fibrillation-initially in RVR, but this has resolved. -Continued Eliquis -Started on metoprolol 50 mg twice daily  CKD III- initially with AKI, but this has resolved.  Hypertension -Continued home clonidine and Norvasc -Started on metoprolol this admission  Hypothyroidism-TSH was elevated, but T4 was normal this admission. -Continue home Synthroid -Recheck thyroid studies as an outpatient  Hyperlipidemia-stable -Continued home statin  DISCHARGE CONDITIONS:  Community-acquired pneumonia Asthma exacerbation Acute on chronic diastolic CHF New onset atrial fibrillation CKD 3 Hypertension Hypothyroidism Hyperlipidemia CONSULTS OBTAINED:  None DRUG ALLERGIES:  No Known Allergies DISCHARGE MEDICATIONS:   Allergies as of 10/21/2018   No Known Allergies     Medication List    STOP taking these medications   cephALEXin 500 MG capsule Commonly known as: KEFLEX   predniSONE 10 MG (48) Tbpk tablet Commonly known as: STERAPRED UNI-PAK 48 TAB Replaced by: predniSONE 50 MG tablet     TAKE these medications   albuterol 108 (90 Base) MCG/ACT inhaler Commonly known as: VENTOLIN HFA Inhale 2 puffs into the lungs every 6 (six) hours as  needed for wheezing or shortness of breath.   ALPRAZolam 0.5 MG tablet Commonly known as: XANAX Take 0.5 mg by mouth as needed for anxiety.   amLODipine 5 MG tablet Commonly known as: NORVASC Take 5 mg by mouth 2 (two) times daily.   apixaban 5 MG Tabs tablet Commonly known as: Eliquis Take 1 tablet (5 mg total)  by mouth 2 (two) times daily.   atorvastatin 20 MG tablet Commonly known as: LIPITOR Take 20 mg by mouth at bedtime.   calcium carbonate 1500 (600 Ca) MG Tabs tablet Commonly known as: OSCAL Take 600 mg of elemental calcium by mouth daily.   CINNAMON PO Take 1,000 mg by mouth daily.   cloNIDine 0.2 MG tablet Commonly known as: CATAPRES Take 1 tablet (0.2 mg total) by mouth 2 (two) times daily. What changed: when to take this   doxycycline 100 MG tablet Commonly known as: ADOXA Take 1 tablet (100 mg total) by mouth 2 (two) times daily.   fluticasone-salmeterol 230-21 MCG/ACT inhaler Commonly known as: ADVAIR HFA Inhale 2 puffs into the lungs 2 (two) times daily.   folic acid 737 MCG tablet Commonly known as: FOLVITE Take 800 mcg by mouth daily.   furosemide 20 MG tablet Commonly known as: LASIX Take 20 mg by mouth daily as needed for edema.   levothyroxine 112 MCG tablet Commonly known as: SYNTHROID Take 112 mcg daily before breakfast by mouth.   metoprolol tartrate 50 MG tablet Commonly known as: LOPRESSOR Take 1 tablet (50 mg total) by mouth 2 (two) times daily.   predniSONE 50 MG tablet Commonly known as: DELTASONE Take 1 tablet (50 mg total) by mouth daily with breakfast for 1 day. Start taking on: October 22, 2018 Replaces: predniSONE 10 MG (48) Tbpk tablet   Trulicity 1.5 TG/6.2IR Sopn Generic drug: Dulaglutide Inject 1.5 mg into the skin every Monday. night   VITAMIN C PO Take 1 tablet by mouth 2 (two) times daily.   Vitamin D3 1.25 MG (50000 UT) Tabs Take 1 tablet by mouth once a week.        DISCHARGE INSTRUCTIONS:  1.  Follow-up with PCP in 5 days 2.  Follow-up in heart failure clinic in the next week 3.  Take prednisone 50 mg tomorrow with breakfast to complete a 5-day course of steroids 4.  Take doxycycline 100 mg twice daily starting tonight, and continuing for 1 more day 5.  Clonidine dose changed from 0.2 mg daily to 0.2 mg twice daily  6.  Take metoprolol 50 mg twice daily DIET:  Cardiac diet and Diabetic diet DISCHARGE CONDITION:  Stable ACTIVITY:  Activity as tolerated OXYGEN:  Home Oxygen: No.  Oxygen Delivery: room air DISCHARGE LOCATION:  home   If you experience worsening of your admission symptoms, develop shortness of breath, life threatening emergency, suicidal or homicidal thoughts you must seek medical attention immediately by calling 911 or calling your MD immediately  if symptoms less severe.  You Must read complete instructions/literature along with all the possible adverse reactions/side effects for all the Medicines you take and that have been prescribed to you. Take any new Medicines after you have completely understood and accpet all the possible adverse reactions/side effects.   Please note  You were cared for by a hospitalist during your hospital stay. If you have any questions about your discharge medications or the care you received while you were in the hospital after you are discharged, you can call the unit and asked to  speak with the hospitalist on call if the hospitalist that took care of you is not available. Once you are discharged, your primary care physician will handle any further medical issues. Please note that NO REFILLS for any discharge medications will be authorized once you are discharged, as it is imperative that you return to your primary care physician (or establish a relationship with a primary care physician if you do not have one) for your aftercare needs so that they can reassess your need for medications and monitor your lab values.    On the day of Discharge:  VITAL SIGNS:  Blood pressure (!) 179/77, pulse 86, temperature 98.1 F (36.7 C), temperature source Oral, resp. rate 20, height 5\' 4"  (1.626 m), weight (!) 148.6 kg, last menstrual period 07/17/2012, SpO2 96 %. PHYSICAL EXAMINATION:  GENERAL:  53 y.o.-year-old patient lying in the bed with no acute distress.  EYES:  Pupils equal, round, reactive to light and accommodation. No scleral icterus. Extraocular muscles intact.  HEENT: Head atraumatic, normocephalic. Oropharynx and nasopharynx clear.  NECK:  Supple, no jugular venous distention. No thyroid enlargement, no tenderness.  LUNGS: + Diminished breath sounds in the lung bases bilaterally. No use of accessory muscles of respiration.  CARDIOVASCULAR: Irregularly irregular rhythm, regular rate, S1, S2 normal. No murmurs, rubs, or gallops.  ABDOMEN: Soft, non-tender, non-distended. Bowel sounds present. No organomegaly or mass.  EXTREMITIES: No cyanosis, or clubbing. +trace bilateral pitting edema NEUROLOGIC: Cranial nerves II through XII are intact. Muscle strength 5/5 in all extremities. Sensation intact. Gait not checked.  PSYCHIATRIC: The patient is alert and oriented x 3.  SKIN: No obvious rash, lesion, or ulcer.  DATA REVIEW:   CBC Recent Labs  Lab 10/21/18 0441  WBC 13.8*  HGB 13.3  HCT 42.0  PLT 199    Chemistries  Recent Labs  Lab 10/21/18 0441  NA 142  K 3.9  CL 109  CO2 27  GLUCOSE 216*  BUN 59*  CREATININE 1.45*  CALCIUM 8.1*     Microbiology Results  Results for orders placed or performed during the hospital encounter of 10/18/18  SARS Coronavirus 2 [facility] order, Performed in [facility] hospital lab) Nasopharyngeal Nasopharyngeal Swab     Status: None   Collection Time: 10/18/18  1:06 AM   Specimen: Nasopharyngeal Swab  Result Value Ref Range Status   SARS Coronavirus 2 NEGATIVE NEGATIVE Final    Comment: (NOTE) If result is NEGATIVE SARS-CoV-2 target nucleic acids are NOT DETECTED. The SARS-CoV-2 RNA is generally detectable in upper and lower  respiratory specimens during the acute phase of infection. The lowest  concentration of SARS-CoV-2 viral copies this assay can detect is 250  copies / mL. A negative result does not preclude SARS-CoV-2 infection  and should not be used as the sole basis for treatment or  other  patient management decisions.  A negative result may occur with  improper specimen collection / handling, submission of specimen other  than nasopharyngeal swab, presence of viral mutation(s) within the  areas targeted by this assay, and inadequate number of viral copies  (<250 copies / mL). A negative result must be combined with clinical  observations, patient history, and epidemiological information. If result is POSITIVE SARS-CoV-2 target nucleic acids are DETECTED. The SARS-CoV-2 RNA is generally detectable in upper and lower  respiratory specimens dur ing the acute phase of infection.  Positive  results are indicative of active infection with SARS-CoV-2.  Clinical  correlation with patient history and other diagnostic  information is  necessary to determine patient infection status.  Positive results do  not rule out bacterial infection or co-infection with other viruses. If result is PRESUMPTIVE POSTIVE SARS-CoV-2 nucleic acids MAY BE PRESENT.   A presumptive positive result was obtained on the submitted specimen  and confirmed on repeat testing.  While 2019 novel coronavirus  (SARS-CoV-2) nucleic acids may be present in the submitted sample  additional confirmatory testing may be necessary for epidemiological  and / or clinical management purposes  to differentiate between  SARS-CoV-2 and other Sarbecovirus currently known to infect humans.  If clinically indicated additional testing with an alternate test  methodology [id number]) is advised. The SARS-CoV-2 RNA is generally  detectable in upper and lower respiratory sp ecimens during the acute  phase of infection. The expected result is Negative. Fact Sheet for Patients:  StrictlyIdeas.no Fact Sheet for Healthcare Providers: BankingDealers.co.za This test is not yet approved or cleared by the [place] FDA and has been authorized for detection and/or diagnosis of  SARS-CoV-2 by FDA under an Emergency Use Authorization (EUA).  This EUA will remain in effect (meaning this test can be used) for the duration of the COVID-19 declaration under Section 564(b)(1) of the Act, 21 U.S.C. section 360bbb-3(b)(1), unless the authorization is terminated or revoked sooner. Performed at [facility], [address] [place], [place] [address]   Blood culture (routine x 2)     Status: None (Preliminary result)   Collection Time: 10/18/18  2:56 AM   Specimen: BLOOD  Result Value Ref Range Status   Specimen Description BLOOD RIGHT ASSIST CONTROL  Final   Special Requests   Final    BOTTLES DRAWN AEROBIC AND ANAEROBIC Blood Culture results may not be optimal due to an excessive volume of blood received in culture bottles   Culture   Final    NO GROWTH 3 DAYS Performed at [facility], [address], [place], [place] [address]    Report Status PENDING  Incomplete  Blood culture (routine x 2)     Status: None (Preliminary result)   Collection Time: 10/18/18  3:04 AM   Specimen: BLOOD  Result Value Ref Range Status   Specimen Description BLOOD LEFT HAND  Final   Special Requests   Final    BOTTLES DRAWN AEROBIC AND ANAEROBIC Blood Culture results may not be optimal due to an excessive volume of blood received in culture bottles   Culture   Final    NO GROWTH 3 DAYS Performed at [facility], [address] [place], [place] [address]    Report Status PENDING  Incomplete    RADIOLOGY:  No results found.   Management plans discussed with the patient, family and they are in agreement.  CODE STATUS: Full Code   TOTAL TIME TAKING CARE OF THIS PATIENT: 45 minutes.    Berna Spare [place] Schram M.D on 10/21/2018 at 12:52 PM  Between 7am to 6pm - Pager - [phone]  After 6pm go to [email or web address] - Proofreader  [facility]  Office  [phone]  CC: Primary care physician; Lenard Simmer, MD    Note: This dictation was prepared with Dragon dictation along with smaller phrase technology. Any transcriptional errors that result from this process are unintentional.

## 2018-10-21 NOTE — Progress Notes (Signed)
Patient Discharged home, medication education and CHF education provided for patient this afternoon, follow up appointment information provided, no further patient  Concerns prior to discharge at this time.

## 2018-10-21 NOTE — Plan of Care (Signed)
  Problem: Education: Goal: Knowledge of General Education information will improve Description: Including pain rating scale, medication(s)/side effects and non-pharmacologic comfort measures 10/21/2018 1316 by Alen Blew, RN Outcome: Adequate for Discharge 10/21/2018 1316 by Alen Blew, RN Outcome: Adequate for Discharge   Problem: Health Behavior/Discharge Planning: Goal: Ability to manage health-related needs will improve 10/21/2018 1316 by Alen Blew, RN Outcome: Adequate for Discharge 10/21/2018 1316 by Alen Blew, RN Outcome: Adequate for Discharge   Problem: Clinical Measurements: Goal: Ability to maintain clinical measurements within normal limits will improve 10/21/2018 1316 by Alen Blew, RN Outcome: Adequate for Discharge 10/21/2018 1316 by Alen Blew, RN Outcome: Adequate for Discharge Goal: Will remain free from infection 10/21/2018 1316 by Alen Blew, RN Outcome: Adequate for Discharge 10/21/2018 1316 by Alen Blew, RN Outcome: Adequate for Discharge Goal: Diagnostic test results will improve 10/21/2018 1316 by Alen Blew, RN Outcome: Adequate for Discharge 10/21/2018 1316 by Alen Blew, RN Outcome: Adequate for Discharge Goal: Respiratory complications will improve 10/21/2018 1316 by Alen Blew, RN Outcome: Adequate for Discharge 10/21/2018 1316 by Alen Blew, RN Outcome: Adequate for Discharge Goal: Cardiovascular complication will be avoided 10/21/2018 1316 by Alen Blew, RN Outcome: Adequate for Discharge 10/21/2018 1316 by Alen Blew, RN Outcome: Adequate for Discharge   Problem: Coping: Goal: Level of anxiety will decrease 10/21/2018 1316 by Alen Blew, RN Outcome: Adequate for Discharge 10/21/2018 1316 by Alen Blew, RN Outcome: Adequate for Discharge   Problem: Elimination: Goal: Will not experience complications related to bowel motility 10/21/2018 1316 by Alen Blew,  RN Outcome: Adequate for Discharge 10/21/2018 1316 by Alen Blew, RN Outcome: Adequate for Discharge Goal: Will not experience complications related to urinary retention 10/21/2018 1316 by Alen Blew, RN Outcome: Adequate for Discharge 10/21/2018 1316 by Alen Blew, RN Outcome: Adequate for Discharge

## 2018-10-21 NOTE — Plan of Care (Signed)
  Problem: Education: Goal: Knowledge of General Education information will improve Description: Including pain rating scale, medication(s)/side effects and non-pharmacologic comfort measures 10/21/2018 1317 by Alen Blew, RN Outcome: Adequate for Discharge 10/21/2018 1316 by Alen Blew, RN Outcome: Adequate for Discharge 10/21/2018 1316 by Alen Blew, RN Outcome: Adequate for Discharge   Problem: Health Behavior/Discharge Planning: Goal: Ability to manage health-related needs will improve 10/21/2018 1317 by Alen Blew, RN Outcome: Adequate for Discharge 10/21/2018 1316 by Alen Blew, RN Outcome: Adequate for Discharge 10/21/2018 1316 by Alen Blew, RN Outcome: Adequate for Discharge   Problem: Clinical Measurements: Goal: Ability to maintain clinical measurements within normal limits will improve 10/21/2018 1317 by Alen Blew, RN Outcome: Adequate for Discharge 10/21/2018 1316 by Alen Blew, RN Outcome: Adequate for Discharge 10/21/2018 1316 by Alen Blew, RN Outcome: Adequate for Discharge Goal: Will remain free from infection 10/21/2018 1317 by Alen Blew, RN Outcome: Adequate for Discharge 10/21/2018 1316 by Alen Blew, RN Outcome: Adequate for Discharge 10/21/2018 1316 by Alen Blew, RN Outcome: Adequate for Discharge Goal: Diagnostic test results will improve 10/21/2018 1317 by Alen Blew, RN Outcome: Adequate for Discharge 10/21/2018 1316 by Alen Blew, RN Outcome: Adequate for Discharge 10/21/2018 1316 by Alen Blew, RN Outcome: Adequate for Discharge Goal: Respiratory complications will improve 10/21/2018 1317 by Alen Blew, RN Outcome: Adequate for Discharge 10/21/2018 1316 by Alen Blew, RN Outcome: Adequate for Discharge 10/21/2018 1316 by Alen Blew, RN Outcome: Adequate for Discharge Goal: Cardiovascular complication will be avoided 10/21/2018 1317 by Alen Blew, RN Outcome:  Adequate for Discharge 10/21/2018 1316 by Alen Blew, RN Outcome: Adequate for Discharge 10/21/2018 1316 by Alen Blew, RN Outcome: Adequate for Discharge   Problem: Elimination: Goal: Will not experience complications related to bowel motility 10/21/2018 1317 by Alen Blew, RN Outcome: Adequate for Discharge 10/21/2018 1316 by Alen Blew, RN Outcome: Adequate for Discharge 10/21/2018 1316 by Alen Blew, RN Outcome: Adequate for Discharge Goal: Will not experience complications related to urinary retention 10/21/2018 1317 by Alen Blew, RN Outcome: Adequate for Discharge 10/21/2018 1316 by Alen Blew, RN Outcome: Adequate for Discharge 10/21/2018 1316 by Alen Blew, RN Outcome: Adequate for Discharge   Problem: Safety: Goal: Ability to remain free from injury will improve 10/21/2018 1317 by Alen Blew, RN Outcome: Adequate for Discharge 10/21/2018 1316 by Alen Blew, RN Outcome: Adequate for Discharge 10/21/2018 1316 by Alen Blew, RN Outcome: Adequate for Discharge

## 2018-10-21 NOTE — Plan of Care (Signed)
  Problem: Education: Goal: Knowledge of General Education information will improve Description: Including pain rating scale, medication(s)/side effects and non-pharmacologic comfort measures Outcome: Progressing   Problem: Clinical Measurements: Goal: Will remain free from infection Outcome: Progressing Goal: Respiratory complications will improve Outcome: Progressing   

## 2018-10-21 NOTE — Discharge Instructions (Signed)
It was so nice to meet you during this hospitalization!  You came into the hospital with shortness of breath. We found that you had a pneumonia. You also went into heart rhythm called atrial fibrillation.  I have made the following medication changes: 1. You were previously taking Clonidine 0.2mg  once a day- please start taking Clonidine 0.2mg  twice a day 2. I have prescribed Metoprolol for your heart rate- please take 1 tablet twice a day 3. You need to take 1 more day of Prednisone tomorrow morning 4. I have prescribed an antibiotic called Doxycycline- please take 1 tablet tonight, 1 tablet tomorrow morning, and 1 tablet tomorrow night to complete this course.  Take care, Dr. Brett Albino

## 2018-10-21 NOTE — Progress Notes (Signed)
Patient ambulated around nurses station, O2 sats 91-92% on room air, HR remains stable with activity in the 110's.

## 2018-10-23 LAB — CULTURE, BLOOD (ROUTINE X 2)
Culture: NO GROWTH
Culture: NO GROWTH

## 2018-10-24 ENCOUNTER — Inpatient Hospital Stay: Admission: RE | Admit: 2018-10-24 | Payer: BC Managed Care – PPO | Source: Ambulatory Visit

## 2018-10-28 NOTE — Progress Notes (Signed)
Patient ID: Nicole Schmidt, female    DOB: 1965-08-20, 53 y.o.   MRN: [id number]  HPI  Ms Barclift is a 53 y/o female with a history of breast cancer, DM, HTN, CKD, thyroid disease, anemia, gout, pulmonary embolus and chronic heart failure.   Echo report from 10/20/2018 reviewed and showed an EF of 60-65% along with an elevated PA pressure of 50.7 mmHg. Echo report from 11/20/17 reviewed and showed an EF of 55-60%.  Admitted 10/18/2018 due to pneumonia, acute on chronic HF and new-onset AF. Given antibiotics and steroids. Initially given IV lasix and then transitioned to oral diuretics. Discharged after 3 days.   She presents today for a follow-up visit with a chief complaint of moderate shortness of breath upon minimal exertion. She describes this as chronic in nature having been present for several years although she feels like it's gotten worse since she was admitted into the hospital. She has associated fatigue, cough, pedal edema (worsening), easy bruising, anxiety, difficulty sleeping and gradual weight gain. She denies any dizziness, abdominal distention, palpitations or weakness. Had an episode of anterior chest pain the other day after she was coughing but it quickly resolved.   Was diagnosed with new onset atrial fibrillation during her hospitalization but converted back to NSR. She thinks she would like to see cardiology regarding this as she's concerned she could potentially have some blockages in her heart. Saw her PCP yesterday who told her that her pneumonia was cleared.   Past Medical History:  Diagnosis Date  . Anemia   . Arthritis   . Breast cancer of upper-inner quadrant of right female breast ([place])    Right breast invasive CA and DCIS , 7 mm T1,N0,M0. Er/PR pos, her 2 negative.  Margins 1 mm.  . CHF (congestive heart failure) ([place])   . Diabetes mellitus without complication ([place])   . Gout   . Gout   . Hypertension   . Menopause    age 33  . Personal history of radiation  therapy   . Pulmonary embolism ([place]) 10/2014  . Renal insufficiency   . Thyroid goiter    Past Surgical History:  Procedure Laterality Date  . BREAST BIOPSY Right 2014   breast ca  . BREAST EXCISIONAL BIOPSY Right 07/19/2012   breast ca  . BREAST SURGERY Right 2014   with sentinel node bx subareaolar duct excision  . COLONOSCOPY WITH PROPOFOL N/A 01/30/2017   Procedure: COLONOSCOPY WITH PROPOFOL;  Surgeon: Christene Lye, MD;  Location: [facility] ENDOSCOPY;  Service: Endoscopy;  Laterality: N/A;   Family History  Problem Relation Age of Onset  . Diabetes Father   . Hypertension Father   . Parkinson's disease Father   . Hypertension Mother   . Rheum arthritis Mother   . Atrial fibrillation Mother   . Heart failure Mother   . Colon cancer Cousin 65  . Breast cancer Neg Hx    Social History   Tobacco Use  . Smoking status: Never Smoker  . Smokeless tobacco: Never Used  Substance Use Topics  . Alcohol use: No    Alcohol/week: 0.0 standard drinks   No Known Allergies  Prior to Admission medications   Medication Sig Start Date End Date Taking? Authorizing Provider  albuterol (VENTOLIN HFA) 108 (90 Base) MCG/ACT inhaler Inhale 2 puffs into the lungs every 6 (six) hours as needed for wheezing or shortness of breath.   Yes [provider]  amLODipine (NORVASC) 5 MG tablet Take 5 mg  by mouth 2 (two) times daily. 09/18/18  Yes [provider]  apixaban (ELIQUIS) 5 MG TABS tablet Take 1 tablet (5 mg total) by mouth 2 (two) times daily. 07/15/18  Yes Corcoran, Drue Second, MD  Ascorbic Acid (VITAMIN C PO) Take 1 tablet by mouth 2 (two) times daily.    Yes [provider]  atorvastatin (LIPITOR) 20 MG tablet Take 20 mg by mouth at bedtime.    Yes [provider]  calcium carbonate (OSCAL) 1500 (600 Ca) MG TABS tablet Take 600 mg of elemental calcium by mouth daily.    Yes [provider]  Cholecalciferol (VITAMIN D3) 50000 units TABS Take 1  tablet by mouth once a week.   Yes [provider]  CINNAMON PO Take 1,000 mg by mouth daily.    Yes [provider]  cloNIDine (CATAPRES) 0.2 MG tablet Take 1 tablet (0.2 mg total) by mouth 2 (two) times daily. 10/21/18  Yes Mayo, Pete Pelt, MD  Dulaglutide (TRULICITY) 1.5 LD/3.5TS SOPN Inject 1.5 mg into the skin every Monday. night   Yes [provider]  fluticasone-salmeterol (ADVAIR HFA) 230-21 MCG/ACT inhaler Inhale 2 puffs into the lungs 2 (two) times daily.   Yes [provider]  folic acid (FOLVITE) 177 MCG tablet Take 800 mcg by mouth daily.    Yes [provider]  furosemide (LASIX) 20 MG tablet Take 20 mg by mouth daily as needed for edema.    Yes [provider]  levothyroxine (SYNTHROID, LEVOTHROID) 112 MCG tablet Take 112 mcg daily before breakfast by mouth.   Yes [provider]  metoprolol tartrate (LOPRESSOR) 50 MG tablet Take 1 tablet (50 mg total) by mouth 2 (two) times daily. 10/21/18  Yes Mayo, Pete Pelt, MD    Review of Systems  Constitutional: Positive for fatigue. Negative for appetite change.  HENT: Negative for congestion, postnasal drip and sore throat.   Eyes: Negative.   Respiratory: Positive for cough (productive) and shortness of breath (with minimal exertion).   Cardiovascular: Positive for leg swelling. Negative for chest pain and palpitations.  Gastrointestinal: Negative for abdominal distention and abdominal pain.  Endocrine: Negative.   Genitourinary: Negative.   Musculoskeletal: Positive for arthralgias (feet at times). Negative for back pain.  Skin: Negative.   Allergic/Immunologic: Negative.   Neurological: Negative for dizziness, weakness and light-headedness.  Hematological: Negative for adenopathy. Bruises/bleeds easily.  Psychiatric/Behavioral: Positive for sleep disturbance. Negative for dysphoric mood. The patient is nervous/anxious.    Vitals:   10/29/18 1152  BP: (!) 142/89  Pulse:  84  Resp: 18  SpO2: 94%  Weight: (!) 335 lb 6 oz (152.1 kg)  Height: 5\' 4"  (1.626 m)   Wt Readings from Last 3 Encounters:  10/29/18 (!) 335 lb 6 oz (152.1 kg)  10/21/18 (!) 327 lb 9.7 oz (148.6 kg)  09/27/18 (!) 328 lb 2 oz (148.8 kg)   Lab Results  Component Value Date   CREATININE 1.45 (H) 10/21/2018   CREATININE 1.55 (H) 10/20/2018   CREATININE 1.57 (H) 10/19/2018    Physical Exam Vitals signs and nursing note reviewed.  Constitutional:      Appearance: She is well-developed.  HENT:     Head: Normocephalic and atraumatic.  Cardiovascular:     Rate and Rhythm: Normal rate and regular rhythm.  Pulmonary:     Effort: Pulmonary effort is normal.     Breath sounds: Normal breath sounds. No wheezing or rales.  Abdominal:     Palpations:  Abdomen is soft.     Tenderness: There is no abdominal tenderness.  Musculoskeletal:     Right lower leg: She exhibits no tenderness. Edema (3+ pitting up to knee) present.     Left lower leg: She exhibits no tenderness. Edema (3+ pitting up to knee) present.     Comments: (had lymph node removal 2014 in right arm)  Skin:    General: Skin is warm and dry.  Neurological:     General: No focal deficit present.     Mental Status: She is alert and oriented to person, place, and time.  Psychiatric:        Mood and Affect: Mood normal.        Behavior: Behavior normal.   Assessment & Plan:  1: Acute on Chronic heart failure with preserved ejection fraction- - NYHA class III - fluid overloaded today with increased weight, edema and worsening shortness of breath - weighing daily; reminded to weigh every morning, write the weight down and call for an overnight weight gain of >2 pounds or a weekly weight gain of >5 pounds - weight up 7 pounds from last visit here 1 month ago & 8 pounds up from hospital d/c 8 days ago - will send for 80mg  IV lasix/ 41meq PO potassium today - will get BMP/BNP drawn today - not adding salt and has been reading  food labels; reviewed the importance of closely following a 2000mg  sodium diet  - 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 Fletcher Anon or Dr. Ubaldo Glassing; both offices were called although unable to reach anyone at Dr. Bethanne Ginger office - cardiology referral made for 11/01/2018 with Dr. Fletcher Anon  4: Lymphedema- - stage 2 - trying to elevate her legs when she's not at work with minimal reduction of edema - has compression socks and has been trying to wear them but says that they hurt her leg and cut into the upper part of her leg - she's got zipper compression socks now but they still hurt her legs especially now with the increased edema - limited in her ability to exercise due to her work schedule  - consider lymphapress compression boots if edema persists  Medication bottles were reviewed.  Return to clinic tomorrow for a recheck of symptoms.

## 2018-10-29 ENCOUNTER — Other Ambulatory Visit: Payer: Self-pay

## 2018-10-29 ENCOUNTER — Ambulatory Visit: Payer: BC Managed Care – PPO | Admitting: Family

## 2018-10-29 ENCOUNTER — Encounter: Payer: Self-pay | Admitting: Family

## 2018-10-29 ENCOUNTER — Other Ambulatory Visit: Payer: Self-pay | Admitting: Family

## 2018-10-29 ENCOUNTER — Ambulatory Visit
Admission: RE | Admit: 2018-10-29 | Discharge: 2018-10-29 | Disposition: A | Discharge status: Home / Self Care | Payer: BC Managed Care – PPO | Source: Ambulatory Visit | Attending: Family | Admitting: Family

## 2018-10-29 VITALS — BP 142/89 | HR 84 | Resp 18 | Ht 64.0 in | Wt 335.4 lb

## 2018-10-29 DIAGNOSIS — I5033 Acute on chronic diastolic (congestive) heart failure: Secondary | ICD-10-CM

## 2018-10-29 DIAGNOSIS — E079 Disorder of thyroid, unspecified: Secondary | ICD-10-CM | POA: Insufficient documentation

## 2018-10-29 DIAGNOSIS — Z8249 Family history of ischemic heart disease and other diseases of the circulatory system: Secondary | ICD-10-CM | POA: Insufficient documentation

## 2018-10-29 DIAGNOSIS — I480 Paroxysmal atrial fibrillation: Secondary | ICD-10-CM | POA: Insufficient documentation

## 2018-10-29 DIAGNOSIS — Z79890 Hormone replacement therapy (postmenopausal): Secondary | ICD-10-CM | POA: Insufficient documentation

## 2018-10-29 DIAGNOSIS — Z7901 Long term (current) use of anticoagulants: Secondary | ICD-10-CM | POA: Insufficient documentation

## 2018-10-29 DIAGNOSIS — I10 Essential (primary) hypertension: Secondary | ICD-10-CM

## 2018-10-29 DIAGNOSIS — E1122 Type 2 diabetes mellitus with diabetic chronic kidney disease: Secondary | ICD-10-CM | POA: Insufficient documentation

## 2018-10-29 DIAGNOSIS — Z833 Family history of diabetes mellitus: Secondary | ICD-10-CM | POA: Insufficient documentation

## 2018-10-29 DIAGNOSIS — M1990 Unspecified osteoarthritis, unspecified site: Secondary | ICD-10-CM | POA: Insufficient documentation

## 2018-10-29 DIAGNOSIS — I890 Lymphedema, not elsewhere classified: Secondary | ICD-10-CM

## 2018-10-29 DIAGNOSIS — Z853 Personal history of malignant neoplasm of breast: Secondary | ICD-10-CM | POA: Insufficient documentation

## 2018-10-29 DIAGNOSIS — Z86711 Personal history of pulmonary embolism: Secondary | ICD-10-CM | POA: Insufficient documentation

## 2018-10-29 DIAGNOSIS — Z923 Personal history of irradiation: Secondary | ICD-10-CM | POA: Insufficient documentation

## 2018-10-29 DIAGNOSIS — Z79899 Other long term (current) drug therapy: Secondary | ICD-10-CM | POA: Insufficient documentation

## 2018-10-29 DIAGNOSIS — Z8261 Family history of arthritis: Secondary | ICD-10-CM | POA: Insufficient documentation

## 2018-10-29 DIAGNOSIS — I110 Hypertensive heart disease with heart failure: Secondary | ICD-10-CM | POA: Diagnosis not present

## 2018-10-29 DIAGNOSIS — I130 Hypertensive heart and chronic kidney disease with heart failure and stage 1 through stage 4 chronic kidney disease, or unspecified chronic kidney disease: Secondary | ICD-10-CM | POA: Insufficient documentation

## 2018-10-29 DIAGNOSIS — Z7951 Long term (current) use of inhaled steroids: Secondary | ICD-10-CM | POA: Insufficient documentation

## 2018-10-29 DIAGNOSIS — N189 Chronic kidney disease, unspecified: Secondary | ICD-10-CM | POA: Insufficient documentation

## 2018-10-29 DIAGNOSIS — I4891 Unspecified atrial fibrillation: Secondary | ICD-10-CM | POA: Insufficient documentation

## 2018-10-29 LAB — BASIC METABOLIC PANEL
Anion gap: 8 (ref 5–15)
BUN: 34 mg/dL — ABNORMAL HIGH (ref 6–20)
CO2: 30 mmol/L (ref 22–32)
Calcium: 8.8 mg/dL — ABNORMAL LOW (ref 8.9–10.3)
Chloride: 106 mmol/L (ref 98–111)
Creatinine, Ser: 1.39 mg/dL — ABNORMAL HIGH (ref 0.44–1.00)
GFR calc Af Amer: 49 mL/min — ABNORMAL LOW (ref >60)
GFR calc non Af Amer: 43 mL/min — ABNORMAL LOW (ref >60)
Glucose, Bld: 132 mg/dL — ABNORMAL HIGH (ref 70–99)
Potassium: 3.6 mmol/L (ref 3.5–5.1)
Sodium: 144 mmol/L (ref 135–145)

## 2018-10-29 LAB — BRAIN NATRIURETIC PEPTIDE: B Natriuretic Peptide: 183 pg/mL — ABNORMAL HIGH (ref 0.0–100.0)

## 2018-10-29 MED ORDER — FUROSEMIDE 10 MG/ML IJ SOLN
80.0000 mg | Freq: Once | INTRAMUSCULAR | Status: AC
  Administered 2018-10-29: 14:00:00 80 mg via INTRAVENOUS

## 2018-10-29 MED ORDER — SODIUM CHLORIDE FLUSH 0.9 % IV SOLN
INTRAVENOUS | Status: AC
  Filled 2018-10-29: qty 20

## 2018-10-29 MED ORDER — FUROSEMIDE 10 MG/ML IJ SOLN
INTRAMUSCULAR | Status: AC
  Administered 2018-10-29: 80 mg via INTRAVENOUS
  Filled 2018-10-29: qty 8

## 2018-10-29 MED ORDER — POTASSIUM CHLORIDE CRYS ER 20 MEQ PO TBCR
EXTENDED_RELEASE_TABLET | ORAL | Status: AC
  Administered 2018-10-29: 40 meq via ORAL
  Filled 2018-10-29: qty 2

## 2018-10-29 MED ORDER — POTASSIUM CHLORIDE CRYS ER 20 MEQ PO TBCR
40.0000 meq | EXTENDED_RELEASE_TABLET | Freq: Once | ORAL | Status: AC
  Administered 2018-10-29: 14:00:00 40 meq via ORAL

## 2018-10-29 NOTE — Progress Notes (Signed)
Patient ID: Nicole Schmidt, female    DOB: 1965/04/03, 53 y.o.   MRN: [id number]  HPI  Nicole Schmidt is a 53 y/o female with a history of breast cancer, DM, HTN, CKD, thyroid disease, anemia, gout, pulmonary embolus and chronic heart failure.   Echo report from 10/20/2018 reviewed and showed an EF of 60-65% along with an elevated PA pressure of 50.7 mmHg. Echo report from 11/20/17 reviewed and showed an EF of 55-60%.  Admitted 10/18/2018 due to pneumonia, acute on chronic HF and new-onset AF. Given antibiotics and steroids. Initially given IV lasix and then transitioned to oral diuretics. Discharged after 3 days.   She presents today for a follow-up visit with a chief complaint of minimal shortness of breath upon moderate exertion. She has associated fatigue, cough, continued pedal edema, sore throat (new today), anxiety and difficulty sleeping along with this. She denies any abdominal distention, palpitations, chest pain, dizziness or weight gain.   Received 80mg  IV lasix/ 51meq PO potassium yesterday but says that she didn't urinate "like I did when I was in the hospital". Doesn't feel like her symptoms are any better either.    Past Medical History:  Diagnosis Date  . Anemia   . Arthritis   . Breast cancer of upper-inner quadrant of right female breast ([place])    Right breast invasive CA and DCIS , 7 mm T1,N0,M0. Er/PR pos, her 2 negative.  Margins 1 mm.  . CHF (congestive heart failure) ([place])   . Diabetes mellitus without complication ([place])   . Gout   . Gout   . Hypertension   . Menopause    age 59  . Personal history of radiation therapy   . Pulmonary embolism ([place]) 10/2014  . Renal insufficiency   . Thyroid goiter    Past Surgical History:  Procedure Laterality Date  . BREAST BIOPSY Right 2014   breast ca  . BREAST EXCISIONAL BIOPSY Right 07/19/2012   breast ca  . BREAST SURGERY Right 2014   with sentinel node bx subareaolar duct excision  . COLONOSCOPY WITH PROPOFOL N/A 01/30/2017    Procedure: COLONOSCOPY WITH PROPOFOL;  Surgeon: Christene Lye, MD;  Location: [facility] ENDOSCOPY;  Service: Endoscopy;  Laterality: N/A;   Family History  Problem Relation Age of Onset  . Diabetes Father   . Hypertension Father   . Parkinson's disease Father   . Hypertension Mother   . Rheum arthritis Mother   . Atrial fibrillation Mother   . Heart failure Mother   . Colon cancer Cousin 80  . Breast cancer Neg Hx    Social History   Tobacco Use  . Smoking status: Never Smoker  . Smokeless tobacco: Never Used  Substance Use Topics  . Alcohol use: No    Alcohol/week: 0.0 standard drinks   No Known Allergies  Prior to Admission medications   Medication Sig Start Date End Date Taking? Authorizing Provider  albuterol (VENTOLIN HFA) 108 (90 Base) MCG/ACT inhaler Inhale 2 puffs into the lungs every 6 (six) hours as needed for wheezing or shortness of breath.   Yes [provider]  amLODipine (NORVASC) 5 MG tablet Take 5 mg by mouth 2 (two) times daily. 09/18/18  Yes [provider]  apixaban (ELIQUIS) 5 MG TABS tablet Take 1 tablet (5 mg total) by mouth 2 (two) times daily. 07/15/18  Yes Corcoran, Drue Second, MD  Ascorbic Acid (VITAMIN C PO) Take 1 tablet by mouth 2 (two) times daily.  Yes [provider]  atorvastatin (LIPITOR) 20 MG tablet Take 20 mg by mouth at bedtime.    Yes [provider]  calcium carbonate (OSCAL) 1500 (600 Ca) MG TABS tablet Take 600 mg of elemental calcium by mouth daily.    Yes [provider]  Cholecalciferol (VITAMIN D3) 50000 units TABS Take 1 tablet by mouth once a week.   Yes [provider]  CINNAMON PO Take 1,000 mg by mouth daily.    Yes [provider]  cloNIDine (CATAPRES) 0.2 MG tablet Take 1 tablet (0.2 mg total) by mouth 2 (two) times daily. 10/21/18  Yes Mayo, Pete Pelt, MD  Dulaglutide (TRULICITY) 1.5 HC/6.2BJ SOPN Inject 1.5 mg into the skin every Monday. night   Yes [provider]  fluticasone-salmeterol (ADVAIR HFA) 230-21 MCG/ACT inhaler Inhale 2 puffs into the lungs 2 (two) times daily.   Yes [provider]  folic acid (FOLVITE) 628 MCG tablet Take 800 mcg by mouth daily.    Yes [provider]  furosemide (LASIX) 20 MG tablet Take 20 mg by mouth daily as needed for edema.    Yes [provider]  levothyroxine (SYNTHROID, LEVOTHROID) 112 MCG tablet Take 112 mcg daily before breakfast by mouth.   Yes [provider]  metoprolol tartrate (LOPRESSOR) 50 MG tablet Take 1 tablet (50 mg total) by mouth 2 (two) times daily. 10/21/18  Yes Mayo, Pete Pelt, MD     Review of Systems  Constitutional: Positive for fatigue. Negative for appetite change.  HENT: Positive for sore throat. Negative for congestion and postnasal drip.   Eyes: Negative.   Respiratory: Positive for cough (productive) and shortness of breath (with minimal exertion).   Cardiovascular: Positive for leg swelling. Negative for chest pain and palpitations.  Gastrointestinal: Negative for abdominal distention and abdominal pain.  Endocrine: Negative.   Genitourinary: Negative.   Musculoskeletal: Positive for arthralgias (feet at times). Negative for back pain.  Skin: Negative.   Allergic/Immunologic: Negative.   Neurological: Negative for dizziness, weakness and light-headedness.  Hematological: Negative for adenopathy. Bruises/bleeds easily.  Psychiatric/Behavioral: Positive for sleep disturbance. Negative for dysphoric mood. The patient is nervous/anxious.    Vitals:   10/30/18 1057  BP: (!) 150/73  Pulse: 90  Resp: 18  SpO2: 95%  Weight: (!) 334 lb (151.5 kg)  Height: 5\' 4"  (1.626 m)   Wt Readings from Last 3 Encounters:  10/30/18 (!) 334 lb (151.5 kg)  10/29/18 (!) 335 lb 6 oz (152.1 kg)  10/21/18 (!) 327 lb 9.7 oz (148.6 kg)   Lab Results  Component Value Date   CREATININE 1.39 (H) 10/29/2018   CREATININE 1.45 (H) 10/21/2018   CREATININE  1.55 (H) 10/20/2018    Physical Exam Vitals signs and nursing note reviewed.  Constitutional:      Appearance: She is well-developed.  HENT:     Head: Normocephalic and atraumatic.  Cardiovascular:     Rate and Rhythm: Normal rate. Rhythm irregular.  Pulmonary:     Effort: Pulmonary effort is normal.     Breath sounds: Normal breath sounds. No wheezing or rales.  Abdominal:     Palpations: Abdomen is soft.     Tenderness: There is no abdominal tenderness.  Musculoskeletal:     Right lower leg: She exhibits no tenderness. Edema (3+ pitting up to knee) present.     Left lower leg: She exhibits no tenderness. Edema (3+ pitting up to knee) present.     Comments: (had lymph  node removal 2014 in right arm)  Skin:    General: Skin is warm and dry.  Neurological:     General: No focal deficit present.     Mental Status: She is alert and oriented to person, place, and time.  Psychiatric:        Mood and Affect: Mood normal.        Behavior: Behavior normal.   Assessment & Plan:  1: Acute on Chronic heart failure with preserved ejection fraction- - NYHA class III - continued fluid overloaded today with edema and shortness of breath - weighing daily; reminded to weigh every morning, write the weight down and call for an overnight weight gain of >2 pounds or a weekly weight gain of >5 pounds - weight down 1.6 pounds from last visit here yesterday - received 80mg  IV lasix/ 52meq PO potassium yesterday; will send for additional dose today - will also increase her daily furosemide to 40mg  daily and she can take 2 of her 20mg  tablets daily - will add daily 16meq potassium as well - not adding salt and has been reading food labels; reviewed the importance of closely following a 2000mg  sodium diet  - 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 Fletcher Anon  4: Lymphedema- - stage 2 - trying to elevate her legs when she's not at work with minimal reduction of edema - has compression socks and has been trying to wear them but says that they hurt her leg and cut into the upper part of her leg - she's got zipper compression socks now but they still hurt her legs especially now with the increased edema - limited in her ability to exercise due to her work schedule  - consider lymphapress compression boots if edema persists  Medication bottles were reviewed.  Currently patient is scheduled to return December 2020. Will see patient sooner as needed depending on outcome of lasix today, lab results and her cardiology evaluation.

## 2018-10-29 NOTE — Patient Instructions (Addendum)
Continue weighing daily and call for an overnight weight gain of > 2 pounds or a weekly weight gain of >5 pounds.  Checking BMP/ BNP today.

## 2018-10-30 ENCOUNTER — Ambulatory Visit
Admission: RE | Admit: 2018-10-30 | Discharge: 2018-10-30 | Disposition: A | Discharge status: Home / Self Care | Payer: BC Managed Care – PPO | Source: Ambulatory Visit | Attending: Family | Admitting: Family

## 2018-10-30 ENCOUNTER — Encounter: Payer: Self-pay | Admitting: Family

## 2018-10-30 ENCOUNTER — Ambulatory Visit: Payer: BC Managed Care – PPO | Admitting: Family

## 2018-10-30 ENCOUNTER — Other Ambulatory Visit: Payer: Self-pay | Admitting: Family

## 2018-10-30 VITALS — BP 150/73 | HR 90 | Resp 18 | Ht 64.0 in | Wt 334.0 lb

## 2018-10-30 DIAGNOSIS — Z86711 Personal history of pulmonary embolism: Secondary | ICD-10-CM | POA: Diagnosis not present

## 2018-10-30 DIAGNOSIS — Z79899 Other long term (current) drug therapy: Secondary | ICD-10-CM | POA: Insufficient documentation

## 2018-10-30 DIAGNOSIS — I5033 Acute on chronic diastolic (congestive) heart failure: Secondary | ICD-10-CM

## 2018-10-30 DIAGNOSIS — Z923 Personal history of irradiation: Secondary | ICD-10-CM | POA: Diagnosis not present

## 2018-10-30 DIAGNOSIS — I890 Lymphedema, not elsewhere classified: Secondary | ICD-10-CM | POA: Diagnosis not present

## 2018-10-30 DIAGNOSIS — Z853 Personal history of malignant neoplasm of breast: Secondary | ICD-10-CM | POA: Insufficient documentation

## 2018-10-30 DIAGNOSIS — Z79890 Hormone replacement therapy (postmenopausal): Secondary | ICD-10-CM | POA: Diagnosis not present

## 2018-10-30 DIAGNOSIS — Z7901 Long term (current) use of anticoagulants: Secondary | ICD-10-CM | POA: Diagnosis not present

## 2018-10-30 DIAGNOSIS — I480 Paroxysmal atrial fibrillation: Secondary | ICD-10-CM

## 2018-10-30 DIAGNOSIS — Z7984 Long term (current) use of oral hypoglycemic drugs: Secondary | ICD-10-CM | POA: Diagnosis not present

## 2018-10-30 DIAGNOSIS — N189 Chronic kidney disease, unspecified: Secondary | ICD-10-CM | POA: Insufficient documentation

## 2018-10-30 DIAGNOSIS — I130 Hypertensive heart and chronic kidney disease with heart failure and stage 1 through stage 4 chronic kidney disease, or unspecified chronic kidney disease: Secondary | ICD-10-CM | POA: Diagnosis not present

## 2018-10-30 DIAGNOSIS — Z7951 Long term (current) use of inhaled steroids: Secondary | ICD-10-CM | POA: Diagnosis not present

## 2018-10-30 DIAGNOSIS — F419 Anxiety disorder, unspecified: Secondary | ICD-10-CM | POA: Insufficient documentation

## 2018-10-30 DIAGNOSIS — I10 Essential (primary) hypertension: Secondary | ICD-10-CM

## 2018-10-30 DIAGNOSIS — Z8249 Family history of ischemic heart disease and other diseases of the circulatory system: Secondary | ICD-10-CM | POA: Diagnosis not present

## 2018-10-30 DIAGNOSIS — E079 Disorder of thyroid, unspecified: Secondary | ICD-10-CM | POA: Diagnosis not present

## 2018-10-30 DIAGNOSIS — I4891 Unspecified atrial fibrillation: Secondary | ICD-10-CM | POA: Insufficient documentation

## 2018-10-30 DIAGNOSIS — E1122 Type 2 diabetes mellitus with diabetic chronic kidney disease: Secondary | ICD-10-CM | POA: Diagnosis not present

## 2018-10-30 LAB — BASIC METABOLIC PANEL
Anion gap: 11 (ref 5–15)
BUN: 33 mg/dL — ABNORMAL HIGH (ref 6–20)
CO2: 27 mmol/L (ref 22–32)
Calcium: 8.7 mg/dL — ABNORMAL LOW (ref 8.9–10.3)
Chloride: 104 mmol/L (ref 98–111)
Creatinine, Ser: 1.55 mg/dL — ABNORMAL HIGH (ref 0.44–1.00)
GFR calc Af Amer: 43 mL/min — ABNORMAL LOW (ref >60)
GFR calc non Af Amer: 37 mL/min — ABNORMAL LOW (ref >60)
Glucose, Bld: 137 mg/dL — ABNORMAL HIGH (ref 70–99)
Potassium: 3.5 mmol/L (ref 3.5–5.1)
Sodium: 142 mmol/L (ref 135–145)

## 2018-10-30 LAB — BRAIN NATRIURETIC PEPTIDE: B Natriuretic Peptide: 156 pg/mL — ABNORMAL HIGH (ref 0.0–100.0)

## 2018-10-30 MED ORDER — SODIUM CHLORIDE FLUSH 0.9 % IV SOLN
INTRAVENOUS | Status: AC
  Filled 2018-10-30: qty 10

## 2018-10-30 MED ORDER — SODIUM CHLORIDE FLUSH 0.9 % IV SOLN
INTRAVENOUS | Status: AC
  Filled 2018-10-30: qty 30

## 2018-10-30 MED ORDER — POTASSIUM CHLORIDE CRYS ER 20 MEQ PO TBCR
EXTENDED_RELEASE_TABLET | ORAL | Status: AC
  Filled 2018-10-30: qty 2

## 2018-10-30 MED ORDER — FUROSEMIDE 10 MG/ML IJ SOLN
INTRAMUSCULAR | Status: AC
  Filled 2018-10-30: qty 4

## 2018-10-30 MED ORDER — POTASSIUM CHLORIDE CRYS ER 20 MEQ PO TBCR: 20.0000 meq | EXTENDED_RELEASE_TABLET | Freq: Every day | ORAL | 3 refills | Status: DC

## 2018-10-30 MED ORDER — POTASSIUM CHLORIDE CRYS ER 20 MEQ PO TBCR
40.0000 meq | EXTENDED_RELEASE_TABLET | Freq: Once | ORAL | Status: AC
  Administered 2018-10-30: 40 meq via ORAL

## 2018-10-30 MED ORDER — FUROSEMIDE 10 MG/ML IJ SOLN
80.0000 mg | Freq: Once | INTRAMUSCULAR | Status: AC
  Administered 2018-10-30: 80 mg via INTRAVENOUS

## 2018-10-30 NOTE — Patient Instructions (Addendum)
Continue weighing daily and call for an overnight weight gain of > 2 pounds or a weekly weight gain of >5 pounds.  Increase your furosemide to 2 tablets daily.   Add 1 tablet of potassium every day.

## 2018-11-01 ENCOUNTER — Telehealth (INDEPENDENT_AMBULATORY_CARE_PROVIDER_SITE_OTHER): Payer: BC Managed Care – PPO | Admitting: Cardiovascular Disease

## 2018-11-01 ENCOUNTER — Telehealth: Payer: Self-pay | Admitting: *Deleted

## 2018-11-01 ENCOUNTER — Other Ambulatory Visit: Payer: Self-pay

## 2018-11-01 VITALS — BP 164/84 | HR 95 | Ht 64.0 in | Wt 323.0 lb

## 2018-11-01 DIAGNOSIS — I4819 Other persistent atrial fibrillation: Secondary | ICD-10-CM

## 2018-11-01 DIAGNOSIS — I5032 Chronic diastolic (congestive) heart failure: Secondary | ICD-10-CM

## 2018-11-01 DIAGNOSIS — I10 Essential (primary) hypertension: Secondary | ICD-10-CM

## 2018-11-01 MED ORDER — METOPROLOL TARTRATE 50 MG PO TABS: 50.0000 mg | ORAL_TABLET | Freq: Two times a day (BID) | ORAL | 2 refills | Status: DC

## 2018-11-01 NOTE — Telephone Encounter (Signed)
YOUR CARDIOLOGY TEAM HAS ARRANGED FOR AN E-VISIT FOR YOUR APPOINTMENT - PLEASE REVIEW IMPORTANT INFORMATION BELOW SEVERAL DAYS PRIOR TO YOUR APPOINTMENT  Due to the recent COVID-19 pandemic, we are transitioning in-person office visits to tele-medicine visits in an effort to decrease unnecessary exposure to our patients, their families, and staff. These visits are billed to your insurance just like a normal visit is. We also encourage you to sign up for MyChart if you have not already done so. You will need a smartphone if possible. For patients that do not have this, we can still complete the visit using a regular telephone but do prefer a smartphone to enable video when possible. You may have a family member that lives with you that can help. If possible, we also ask that you have a blood pressure cuff and scale at home to measure your blood pressure, heart rate and weight prior to your scheduled appointment. Patients with clinical needs that need an in-person evaluation and testing will still be able to come to the office if absolutely necessary. If you have any questions, feel free to call our office.     YOUR PROVIDER WILL BE USING THE FOLLOWING PLATFORM TO COMPLETE YOUR VISIT: Telephone  . IF USING MYCHART - How to Download the MyChart App to Your SmartPhone   - If Apple, go to [employer] and type in MyChart in the search bar and download the app. If Android, ask patient to go to [employer] and type in [place] in the search bar and download the app. The app is free but as with any other app downloads, your phone may require you to verify saved payment information or Apple/Android password.  - You will need to then log into the app with your MyChart username and password, and select [facility] as your healthcare provider to link the account.  - When it is time for your visit, go to the MyChart app, find appointments, and click Begin Video Visit. Be sure to Select Allow for your device to  access the Microphone and Camera for your visit. You will then be connected, and your provider will be with you shortly.  **If you have any issues connecting or need assistance, please contact MyChart service desk [phone])**  **If using a computer, in order to ensure the best quality for your visit, you will need to use either of the following Internet Browsers: Insurance underwriter or [employer]**  . IF USING DOXIMITY or DOXY.ME - The staff will give you instructions on receiving your link to join the meeting the day of your visit.      2-3 DAYS BEFORE YOUR APPOINTMENT  You will receive a telephone call from one of our [facility] team members - your caller ID may say "Unknown caller." If this is a video visit, we will walk you through how to get the video launched on your phone. We will remind you check your blood pressure, heart rate and weight prior to your scheduled appointment. If you have an Apple Watch or Kardia, please upload any pertinent ECG strips the day before or morning of your appointment to [facility]. Our staff will also make sure you have reviewed the consent and agree to move forward with your scheduled tele-health visit.     THE DAY OF YOUR APPOINTMENT  Approximately 15 minutes prior to your scheduled appointment, you will receive a telephone call from one of [facility] team - your caller ID may say "Unknown caller."  Our staff will confirm medications, vital signs for the day and any symptoms you may be experiencing. Please have this information available prior to the time of visit start. It may also be helpful for you to have a pad of paper and pen handy for any instructions given during your visit. They will also walk you through joining the smartphone meeting if this is a video visit.    CONSENT FOR TELE-HEALTH VISIT - PLEASE REVIEW  I hereby voluntarily request, consent and authorize [facility] and its employed or contracted physicians, physician  assistants, nurse practitioners or other licensed health care professionals (the Practitioner), to provide me with telemedicine health care services (the "Services") as deemed necessary by the treating Practitioner. I acknowledge and consent to receive the Services by the Practitioner via telemedicine. I understand that the telemedicine visit will involve communicating with the Practitioner through live audiovisual communication technology and the disclosure of certain medical information by electronic transmission. I acknowledge that I have been given the opportunity to request an in-person assessment or other available alternative prior to the telemedicine visit and am voluntarily participating in the telemedicine visit.  I understand that I have the right to withhold or withdraw my consent to the use of telemedicine in the course of my care at any time, without affecting my right to future care or treatment, and that the Practitioner or I may terminate the telemedicine visit at any time. I understand that I have the right to inspect all information obtained and/or recorded in the course of the telemedicine visit and may receive copies of available information for a reasonable fee.  I understand that some of the potential risks of receiving the Services via telemedicine include:  . Delay or interruption in medical evaluation due to technological equipment failure or disruption; . Information transmitted may not be sufficient (e.g. poor resolution of images) to allow for appropriate medical decision making by the Practitioner; and/or  . In rare instances, security protocols could fail, causing a breach of personal health information.  Furthermore, I acknowledge that it is my responsibility to provide information about my medical history, conditions and care that is complete and accurate to the best of my ability. I acknowledge that Practitioner's advice, recommendations, and/or decision may be based on  factors not within their control, such as incomplete or inaccurate data provided by me or distortions of diagnostic images or specimens that may result from electronic transmissions. I understand that the practice of medicine is not an exact science and that Practitioner makes no warranties or guarantees regarding treatment outcomes. I acknowledge that I will receive a copy of this consent concurrently upon execution via email to the email address I last provided but may also request a printed copy by calling the office of [facility].    I understand that my insurance will be billed for this visit.   I have read or had this consent read to me. . I understand the contents of this consent, which adequately explains the benefits and risks of the Services being provided via telemedicine.  . I have been provided ample opportunity to ask questions regarding this consent and the Services and have had my questions answered to my satisfaction. . I give my informed consent for the services to be provided through the use of telemedicine in my medical care  By participating in this telemedicine visit I agree to the above.  

## 2018-11-01 NOTE — Progress Notes (Signed)
Virtual Visit via Telephone Note   This visit type was conducted due to national recommendations for restrictions regarding the COVID-19 Pandemic (e.g. social distancing) in an effort to limit this patient's exposure and mitigate transmission in our community.  Due to her co-morbid illnesses, this patient is at least at moderate risk for complications without adequate follow up.  This format is felt to be most appropriate for this patient at this time.  The patient did not have access to video technology/had technical difficulties with video requiring transitioning to audio format only (telephone).  All issues noted in this document were discussed and addressed.  No physical exam could be performed with this format.  Please refer to the patient's chart for her  consent to telehealth for [facility].   Date:  11/01/2018   ID:  Nicole Schmidt, DOB 1965-05-02, MRN [id number]  Patient Location: Home Provider Location: Office  PCP:  Lenard Simmer, MD  Cardiologist:  No primary care provider on file.  Electrophysiologist:  None   Evaluation Performed:  New Patient Evaluation  Chief Complaint: Shortness of breath.  History of Present Illness:    Nicole Schmidt is a 53 y.o. female who was referred by Darylene Price for evaluation of atrial fibrillation.  This was supposed to be an office visit but the patient had a low-grade fever and scratchy throat and thus it was rescheduled as a virtual visit.  She has known history of breast cancer, diabetes mellitus, essential hypertension, chronic kidney disease, thyroid disease, morbid obesity, anemia of chronic disease, gout, pulmonary embolism and chronic diastolic heart failure. She was hospitalized recently at [facility] with shortness of breath and was found to be in A. fib with RVR with a heart rate of 130 bpm.  She was diagnosed with community-acquired pneumonia which was treated with antibiotics.  She was started on metoprolol 50 mg twice daily for  atrial fibrillation.  She was already on Eliquis for previous pulmonary embolism and this was continued. She had volume overload post hospital discharge and received IV furosemide.  The dose of furosemide was increased to 40 mg once daily.  Leg edema improved significantly.  She does not feel back to 100% yet.  She continues to have shortness of breath which is gradually improving.  No chest pain.  Leg edema is improving.  The patient does have symptoms concerning for COVID-19 infection (fever, chills, cough, or new shortness of breath).    Past Medical History:  Diagnosis Date   Anemia    Arthritis    Breast cancer of upper-inner quadrant of right female breast ([place])    Right breast invasive CA and DCIS , 7 mm T1,N0,M0. Er/PR pos, her 2 negative.  Margins 1 mm.   CHF (congestive heart failure) (H[facility])    Diabetes mellitus without complication ([place])    Gout    Gout    Hypertension    Menopause    age 13   Personal history of radiation therapy    Pulmonary embolism ([place]) 10/2014   Renal insufficiency    Thyroid goiter    Past Surgical History:  Procedure Laterality Date   BREAST BIOPSY Right 2014   breast ca   BREAST EXCISIONAL BIOPSY Right 07/19/2012   breast ca   BREAST SURGERY Right 2014   with sentinel node bx subareaolar duct excision   COLONOSCOPY WITH PROPOFOL N/A 01/30/2017   Procedure: COLONOSCOPY WITH PROPOFOL;  Surgeon: Christene Lye, MD;  Location: [facility] ENDOSCOPY;  Service:  Endoscopy;  Laterality: N/A;     Current Meds  Medication Sig   albuterol (VENTOLIN HFA) 108 (90 Base) MCG/ACT inhaler Inhale 2 puffs into the lungs every 6 (six) hours as needed for wheezing or shortness of breath.   amLODipine (NORVASC) 5 MG tablet Take 5 mg by mouth 2 (two) times daily.   apixaban (ELIQUIS) 5 MG TABS tablet Take 1 tablet (5 mg total) by mouth 2 (two) times daily.   Ascorbic Acid (VITAMIN C PO) Take 1 tablet by mouth 2 (two) times daily.     atorvastatin (LIPITOR) 20 MG tablet Take 20 mg by mouth at bedtime.    calcium carbonate (OSCAL) 1500 (600 Ca) MG TABS tablet Take 600 mg of elemental calcium by mouth daily.    Cholecalciferol (VITAMIN D3) 50000 units TABS Take 1 tablet by mouth once a week.   CINNAMON PO Take 1,000 mg by mouth daily.    cloNIDine (CATAPRES) 0.2 MG tablet Take 1 tablet (0.2 mg total) by mouth 2 (two) times daily.   Dulaglutide (TRULICITY) 1.5 UR/4.2HC SOPN Inject 1.5 mg into the skin every Monday. night   fluticasone-salmeterol (ADVAIR HFA) 230-21 MCG/ACT inhaler Inhale 2 puffs into the lungs 2 (two) times daily.   folic acid (FOLVITE) 623 MCG tablet Take 800 mcg by mouth daily.    furosemide (LASIX) 20 MG tablet Take 40 mg by mouth daily.    levothyroxine (SYNTHROID, LEVOTHROID) 112 MCG tablet Take 112 mcg daily before breakfast by mouth.   metoprolol tartrate (LOPRESSOR) 50 MG tablet Take 1 tablet (50 mg total) by mouth 2 (two) times daily.   potassium chloride SA (K-DUR) 20 MEQ tablet Take 1 tablet (20 mEq total) by mouth daily.     Allergies:   Patient has no known allergies.   Social History   Tobacco Use   Smoking status: Never Smoker   Smokeless tobacco: Never Used  Substance Use Topics   Alcohol use: No    Alcohol/week: 0.0 standard drinks   Drug use: No     Family Hx: The patient's family history includes Atrial fibrillation in her mother; Colon cancer (age of onset: 68) in her cousin; Diabetes in her father; Heart failure in her mother; Hypertension in her father and mother; Parkinson's disease in her father; Rheum arthritis in her mother. There is no history of Breast cancer.  ROS:   Please see the history of present illness.     All other systems reviewed and are negative.   Prior CV studies:   The following studies were reviewed today:  Recent echocardiogram showed an EF of 60 to 65% with grade 2 diastolic dysfunction and moderate pulmonary hypertension.  There was  small pericardial effusion.  Labs/Other Tests and Data Reviewed:    EKG:  An ECG dated 10/17/2018 was personally reviewed today and demonstrated:  Atrial fibrillation with RVR  Recent Labs: 03/29/2018: Magnesium 1.9 08/30/2018: ALT 16 10/18/2018: TSH 0.065 10/21/2018: Hemoglobin 13.3; Platelets 199 10/30/2018: B Natriuretic Peptide 156.0; BUN 33; Creatinine, Ser 1.55; Potassium 3.5; Sodium 142   Recent Lipid Panel No results found for: CHOL, TRIG, HDL, CHOLHDL, LDLCALC, LDLDIRECT  Wt Readings from Last 3 Encounters:  11/01/18 (!) 323 lb (146.5 kg)  10/30/18 (!) 334 lb (151.5 kg)  10/29/18 (!) 335 lb 6 oz (152.1 kg)     Objective:    Vital Signs:  BP (!) 164/84 (BP Location: Left Arm, Patient Position: Sitting, Cuff Size: Normal)    Pulse 95    Ht  5\' 4"  (1.626 m)    Wt (!) 323 lb (146.5 kg)    LMP 07/17/2012    BMI 55.44 kg/m    VITAL SIGNS:  reviewed  ASSESSMENT & PLAN:    1. Persistent atrial fibrillation: The patient was recently diagnosed with atrial fibrillation in the setting of pneumonia.  She was already on anticoagulation with Eliquis for previous pulmonary embolism.  Chads vas score is 4 and thus I recommend continuing long-term anticoagulation as tolerated.  Ventricular rate seems to be reasonably controlled with metoprolol.  It is hard to know whether she is back in sinus rhythm or not.  I am going to bring her back in 1 month for an EKG.  If she continues to be in atrial fibrillation and symptomatic, I recommend proceeding with cardioversion. 2. Chronic diastolic heart failure: She had recent worsening of volume overload that is improved after increasing the dose of furosemide.  Her weight is down 12 pounds.  She follows with Dr. Holley Raring closely regarding chronic kidney disease. 3. Recent pneumonia: She is still not back to her baseline and given all the associated comorbid conditions and symptoms, I think she should stay off work for another 2 weeks.  She can resume work on  October 5th.  She has some paperwork that needs to be filled. 4.  Essential hypertension: Blood pressure is usually difficult to control.  We should consider switching metoprolol to carvedilol down the road.  COVID-19 Education: The signs and symptoms of COVID-19 were discussed with the patient and how to seek care for testing (follow up with PCP or arrange E-visit).  The importance of social distancing was discussed today.  Time:   Today, I have spent 14 minutes with the patient with telehealth technology discussing the above problems.     Medication Adjustments/Labs and Tests Ordered: Current medicines are reviewed at length with the patient today.  Concerns regarding medicines are outlined above.   Tests Ordered: No orders of the defined types were placed in this encounter.   Medication Changes: No orders of the defined types were placed in this encounter.   Follow Up:  In Person in 1 month(s)  Signed, Kathlyn Sacramento, MD  11/01/2018 2:35 PM    [place]

## 2018-11-01 NOTE — Patient Instructions (Signed)
Medication Instructions:  Continue same medications If you need a refill on your cardiac medications before your next appointment, please call your pharmacy.   Lab work: None If you have labs (blood work) drawn today and your tests are completely normal, you will receive your results only by: Marland Kitchen MyChart Message (if you have MyChart) OR . A paper copy in the mail If you have any lab test that is abnormal or we need to change your treatment, we will call you to review the results.  Testing/Procedures: None  Follow-Up: At [facility], you and your health needs are our priority.  As part of our continuing mission to provide you with exceptional heart care, we have created designated Provider Care Teams.  These Care Teams include your primary Cardiologist (physician) and Advanced Practice Providers (APPs -  Physician Assistants and Nurse Practitioners) who all work together to provide you with the care you need, when you need it. You will need a follow up appointment in 1 months.  Please call our office 2 months in advance to schedule this appointment.  You may see No primary care provider on file. or one of the following Advanced Practice Providers on your designated Care Team:   Murray Hodgkins, NP Christell Faith, PA-C . Marrianne Mood, PA-C

## 2018-11-05 ENCOUNTER — Ambulatory Visit: Payer: BLUE CROSS/BLUE SHIELD | Admitting: Surgery

## 2018-11-12 ENCOUNTER — Other Ambulatory Visit: Payer: Self-pay | Admitting: Family

## 2018-11-12 ENCOUNTER — Other Ambulatory Visit: Payer: Self-pay | Admitting: Cardiovascular Disease

## 2018-11-12 MED ORDER — POTASSIUM CHLORIDE CRYS ER 20 MEQ PO TBCR: 20.0000 meq | EXTENDED_RELEASE_TABLET | Freq: Every day | ORAL | 3 refills | Status: DC

## 2018-11-12 MED ORDER — METOPROLOL TARTRATE 50 MG PO TABS: 50.0000 mg | ORAL_TABLET | Freq: Two times a day (BID) | ORAL | 0 refills | Status: DC

## 2018-11-12 NOTE — Telephone Encounter (Signed)
°*  STAT* If patient is at the pharmacy, call can be transferred to refill team.   1. Which medications need to be refilled? (please list name of each medication and dose if known) Metroprolol 50 mg bid  2. Which pharmacy/location (including street and city if local pharmacy) is medication to be sent to? [facility]  3. Do they need a 30 day or 90 day supply? 90  Patient states she would like her prescriptions sent to [facility], not [facility]

## 2018-11-12 NOTE — Telephone Encounter (Signed)
Requested Prescriptions   Signed Prescriptions Disp Refills  . metoprolol tartrate (LOPRESSOR) 50 MG tablet 180 tablet 0    Sig: Take 1 tablet (50 mg total) by mouth 2 (two) times daily.    Authorizing Provider: Kathlyn Sacramento A    Ordering User: Raelene Bott, Sherah Lund L

## 2018-11-14 ENCOUNTER — Telehealth: Payer: Self-pay | Admitting: Family

## 2018-11-14 ENCOUNTER — Telehealth: Payer: Self-pay | Admitting: Cardiovascular Disease

## 2018-11-14 NOTE — Telephone Encounter (Signed)
I spoke with the patient.  She states she saw her PCP yesterday for a routine visit and her HR was ranging was 80-100's. She was told her HR felt irregular, but her PCP was "not too concerned." She checked her pulse last night with an oximeter and she was running 60-117 bpm.  Today she has been 79-80 consistently. She was concerned this was something she should be worried about.  I advised her with her fairly well controlled heart rates, that we would just have her watch this for now. She confirms she is taking her eliquis and metoprolol.  I have advised her to call if she has sustained HR's > 120 bpm. Otherwise I have asked her to keep a record of how often/ for how long she is noticing her irregular heart beat. She will bring this with her when she comes for her follow up on 10/23.

## 2018-11-14 NOTE — Telephone Encounter (Signed)
Patient c/o Palpitations:  High priority if patient c/o lightheadedness, shortness of breath, or chest pain  1) How long have you had palpitations/irregular HR/ Afib? Are you having the symptoms now?  No current symptoms   2) Are you currently experiencing lightheadedness, SOB or CP? No   3) Do you have a history of afib (atrial fibrillation) or irregular heart rhythm? Yes   4) Have you checked your BP or HR? (document readings if available): at pcp office observed hr 80's and 100's irregular also noticed irregular at home per pulse ox last night   5) Are you experiencing any other symptoms? No   Patient wants to know if this is alarming

## 2018-11-14 NOTE — Telephone Encounter (Signed)
[name] contacted the office today to report irregular heart rate. She states that she saw her PCP yesterday and her HR when checked by the nurse using O2 meter was in the upper 100's, and "bouncing around". She states that she did not take her metoprolol later that night due to going out with a friend but took it when she returned home, she did take the medicine but could feel her heart skipping and racing. She just wants to make sure she should not be to concerned.  Explained to Nicole Schmidt that her readings on the electronic device are not going to be accurate and she needs to check heart rate manual. Advised patient on how to check HR manually. Patient verbalized understanding. Spoke with Darylene Price FNP and she advised that patient contact her cardiologist ( Dr. Fletcher Anon) to see if she needs to be seen sooner, as she had a remote visit on 9/18 due to fever and is not r/s until 10/23.   Patient states she will contact cardiology and advised to contact office if she needs further assistance.   [place]

## 2018-11-15 ENCOUNTER — Telehealth: Payer: Self-pay | Admitting: Cardiovascular Disease

## 2018-11-15 NOTE — Telephone Encounter (Signed)
Patient calling  States that she has felt weak and a little short of breath - nothing serious, just doesn't feel right  Patient is suppose to go back to work on Monday but does not feel it will be best Would like to discuss with nurse ASAP  Please call

## 2018-11-18 NOTE — Telephone Encounter (Signed)
She should be brought for an office visit this week with an EKG to evaluate. She can stay off work until then.

## 2018-11-18 NOTE — Telephone Encounter (Signed)
Returned call to Pt.  Advised appt made with CB on 11/20/2018 at 11:00 am for EKG and evaluation.  Pt indicates understanding.  Will have short term disability faxed to [facility] office.

## 2018-11-18 NOTE — Telephone Encounter (Signed)
Returned call to Pt.  Pt called off at her employment today.  Pt states she continues to have weakness and short of breath with activity.  Dr. Fletcher Anon had cleared her to go back to work today 11/18/2018 but she does not feel back to baseline and her employment is very strenous.  Will forward to Dr. Fletcher Anon for advisement-does she need a sooner EKG/follow up to reassess?

## 2018-11-20 ENCOUNTER — Ambulatory Visit
Admission: RE | Admit: 2018-11-20 | Discharge: 2018-11-20 | Disposition: A | Discharge status: Home / Self Care | Payer: BC Managed Care – PPO | Source: Ambulatory Visit | Attending: Hematology and Oncology | Admitting: Hematology and Oncology

## 2018-11-20 ENCOUNTER — Ambulatory Visit (INDEPENDENT_AMBULATORY_CARE_PROVIDER_SITE_OTHER): Payer: BC Managed Care – PPO | Admitting: Nurse Practitioner

## 2018-11-20 ENCOUNTER — Other Ambulatory Visit: Payer: Self-pay

## 2018-11-20 ENCOUNTER — Encounter: Payer: Self-pay | Admitting: Nurse Practitioner

## 2018-11-20 ENCOUNTER — Telehealth: Payer: Self-pay | Admitting: Cardiovascular Disease

## 2018-11-20 VITALS — BP 130/80 | HR 99 | Ht 64.0 in | Wt 324.2 lb

## 2018-11-20 DIAGNOSIS — I4819 Other persistent atrial fibrillation: Secondary | ICD-10-CM | POA: Diagnosis not present

## 2018-11-20 DIAGNOSIS — Z1231 Encounter for screening mammogram for malignant neoplasm of breast: Secondary | ICD-10-CM | POA: Insufficient documentation

## 2018-11-20 DIAGNOSIS — I5032 Chronic diastolic (congestive) heart failure: Secondary | ICD-10-CM | POA: Diagnosis not present

## 2018-11-20 DIAGNOSIS — R0683 Snoring: Secondary | ICD-10-CM

## 2018-11-20 DIAGNOSIS — Z853 Personal history of malignant neoplasm of breast: Secondary | ICD-10-CM | POA: Diagnosis not present

## 2018-11-20 DIAGNOSIS — I10 Essential (primary) hypertension: Secondary | ICD-10-CM | POA: Diagnosis not present

## 2018-11-20 DIAGNOSIS — N1831 Chronic kidney disease, stage 3a: Secondary | ICD-10-CM | POA: Diagnosis not present

## 2018-11-20 DIAGNOSIS — C50911 Malignant neoplasm of unspecified site of right female breast: Secondary | ICD-10-CM

## 2018-11-20 NOTE — Patient Instructions (Signed)
Medication Instructions:  Your physician recommends that you continue on your current medications as directed. Please refer to the Current Medication list given to you today.  If you need a refill on your cardiac medications before your next appointment, please call your pharmacy.   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  Following covid testing, please remain in quarantine. If you must be around others, please wash hands, avoid touching face and wear your mask.    If you have labs (blood work) drawn today and your tests are completely normal, you will receive your results only by: Marland Kitchen MyChart Message (if you have MyChart) OR . A paper copy in the mail If you have any lab test that is abnormal or we need to change your treatment, we will call you to review the results.  Testing/Procedures: 1-  You are scheduled for a Cardioversion on 11/25/18 with Dr. Fletcher Anon.  Please arrive at the [place] [facility] at 7:00A. (1 hour prior to procedure unless lab work is needed; if lab work is needed arrive 1.5 hours ahead)  DIET: Nothing to eat or drink after midnight except a sip of water with medications (see medication instructions below)  Medication Instructions: Hold Lasix  Take Metoprolol  Continue your anticoagulant: Eliquis You will need to continue your anticoagulant after your procedure until you are told by your  Provider that it is safe to stop  You must have a responsible person to drive you home and stay in the waiting area during your procedure. Failure to do so could result in cancellation.  Bring your insurance cards.  *Special Note: Every effort is made to have your procedure done on time. Occasionally there are  emergencies that occur at the hospital that may cause delays. Please be patient if a delay does occur.    Follow-Up: At [facility], you and your health needs are our priority.  As part of our continuing mission to provide you with exceptional heart care, we have created designated Provider Care Teams.  These Care Teams include your primary Cardiologist (physician) and Advanced Practice Providers (APPs -  Physician Assistants and Nurse Practitioners) who all work together to provide you with the care you need, when you need it. You will need a follow up appointment in 2 weeks.  You may see Kathlyn Sacramento, MD or Murray Hodgkins, NP.

## 2018-11-20 NOTE — Progress Notes (Signed)
Office Visit    Patient Name: Nicole Schmidt Date of Encounter: 11/20/2018  Primary Care Provider:  Lenard Simmer, MD Primary Cardiologist:  Kathlyn Sacramento, MD  Chief Complaint    53 y/o ? with a history of breast cancer status post radiation therapy, hypertension, diabetes, stage III chronic kidney disease, anemia, obesity, hypothyroidism, PE (September 2016) on chronic Eliquis, and HFpEF, who presents for follow-up related to persistent atrial fibrillation.  Past Medical History    Past Medical History:  Diagnosis Date  . (HFpEF) heart failure with preserved ejection fraction ([place])    a. 10/2018 Echo: EF 60-65%, diast dysfxn, RVSP 50.7 mmHg, mildly dil LA.  Marland Kitchen Anemia   . Arthritis   . Breast cancer of upper-inner quadrant of right female breast ([place])    Right breast invasive CA and DCIS , 7 mm T1,N0,M0. Er/PR pos, her 2 negative.  Margins 1 mm.  . CKD (chronic kidney disease), stage III   . Diabetes mellitus without complication ([place])   . Essential hypertension   . Gout   . Hypothyroidism   . Menopause    age 40  . Morbid obesity ([place])   . Persistent atrial fibrillation ([place])    a. Dx 10/2018 in setting of PNA. CHA2DS2VASc = 4-->Eliquis.  . Personal history of radiation therapy   . Pulmonary embolism ([place]) 10/2014   a. Chronic eliquis.  . Thyroid goiter    Past Surgical History:  Procedure Laterality Date  . BREAST BIOPSY Right 2014   breast ca  . BREAST EXCISIONAL BIOPSY Right 07/19/2012   breast ca  . BREAST SURGERY Right 2014   with sentinel node bx subareaolar duct excision  . COLONOSCOPY WITH PROPOFOL N/A 01/30/2017   Procedure: COLONOSCOPY WITH PROPOFOL;  Surgeon: Christene Lye, MD;  Location: [facility] ENDOSCOPY;  Service: Endoscopy;  Laterality: N/A;    Allergies  No Known Allergies  History of Present Illness    53 year old female with a history of breast cancer status post radiation therapy, hypertension, diabetes, stage III chronic kidney  disease, anemia, obesity, hypothyroidism, and pulmonary embolism in September 2016, on chronic Eliquis.  She was admitted to [facility] in early September with dyspnea and was found to be in A. fib with RVR rate of 130.  She was also diagnosed with community-acquired pneumonia treated with antibiotics.  Echo showed normal LV function with diastolic dysfunction and an RVSP of 50.7 mmHg.  She required intravenous diuresis.  Beta-blocker therapy was initiated for atrial fibrillation and rate control was achieved with clinical recovery.  She was subsequently discharged and followed up in heart failure clinic and then via telemedicine with Dr. Fletcher Anon on September 18.  At that time, recommendation was made for follow-up in 1 month with ECG to reevaluate A. fib and consider cardioversion.  Unfortunately, she has continued to struggle with generalized malaise, fatigue, and dyspnea on exertion that is greater than what she might normally expect.  She feels that she probably could have walked about 50 yards without limitations prior to her pneumonia but at this point, she thinks she would struggle to walk 30 to 40 yards.  She occasionally notes lower extremity swelling but this is improved after her Lasix dose was increased to 40 mg daily.  She denies any prior history of chest pain, palpitations, PND, orthopnea, dizziness, syncope, or early satiety.  She does not think that she is ready to return to work at this point and remains in atrial fibrillation at a  rate of 99 bpm today.  She is interested in pursuing cardioversion.  Home Medications    Prior to Admission medications   Medication Sig Start Date End Date Taking? Authorizing Provider  albuterol (VENTOLIN HFA) 108 (90 Base) MCG/ACT inhaler Inhale 2 puffs into the lungs every 6 (six) hours as needed for wheezing or shortness of breath.    [provider]  amLODipine (NORVASC) 5 MG tablet Take 5 mg by mouth 2 (two) times daily. 09/18/18   [provider]  apixaban (ELIQUIS) 5 MG TABS tablet Take 1 tablet (5 mg total) by mouth 2 (two) times daily. 07/15/18   Lequita Asal, MD  Ascorbic Acid (VITAMIN C PO) Take 1 tablet by mouth 2 (two) times daily.     [provider]  atorvastatin (LIPITOR) 20 MG tablet Take 20 mg by mouth at bedtime.     [provider]  calcium carbonate (OSCAL) 1500 (600 Ca) MG TABS tablet Take 600 mg of elemental calcium by mouth daily.     [provider]  Cholecalciferol (VITAMIN D3) 50000 units TABS Take 1 tablet by mouth once a week.    [provider]  CINNAMON PO Take 1,000 mg by mouth daily.     [provider]  cloNIDine (CATAPRES) 0.2 MG tablet Take 1 tablet (0.2 mg total) by mouth 2 (two) times daily. 10/21/18   Mayo, Pete Pelt, MD  Dulaglutide (TRULICITY) 1.5 YH/0.6CB SOPN Inject 1.5 mg into the skin every Monday. night    [provider]  fluticasone-salmeterol (ADVAIR HFA) 230-21 MCG/ACT inhaler Inhale 2 puffs into the lungs 2 (two) times daily.    [provider]  folic acid (FOLVITE) 762 MCG tablet Take 800 mcg by mouth daily.     [provider]  furosemide (LASIX) 20 MG tablet Take 40 mg by mouth daily.     [provider]  levothyroxine (SYNTHROID, LEVOTHROID) 112 MCG tablet Take 112 mcg daily before breakfast by mouth.    [provider]  metoprolol tartrate (LOPRESSOR) 50 MG tablet Take 1 tablet (50 mg total) by mouth 2 (two) times daily. 11/12/18   Wellington Hampshire, MD  potassium chloride SA (KLOR-CON) 20 MEQ tablet Take 1 tablet (20 mEq total) by mouth daily. 11/12/18   Alisa Graff, FNP   Family History   Family History  Problem Relation Age of Onset  . Diabetes Father   . Hypertension Father   . Parkinson's disease Father   . Hypertension Mother   . Rheum arthritis Mother   . Atrial fibrillation Mother   . Heart failure Mother   . Heart attack Mother        died in her mid-70's.  .  Colon cancer Cousin 50  . Breast cancer Neg Hx     Social History   Social History   Socioeconomic History  . Marital status: Single    Spouse name: Not on file  . Number of children: Not on file  . Years of education: Not on file  . Highest education level: Not on file  Occupational History  . Not on file  Social Needs  . Financial resource strain: Patient refused  . Food insecurity    Worry: Patient refused    Inability: Patient refused  . Transportation needs    Medical: Patient refused    Non-medical: Patient refused  Tobacco Use  . Smoking status: Never Smoker  . Smokeless tobacco: Never Used  Substance and Sexual  Activity  . Alcohol use: No    Alcohol/week: 0.0 standard drinks  . Drug use: No  . Sexual activity: Not on file  Lifestyle  . Physical activity    Days per week: Patient refused    Minutes per session: Patient refused  . Stress: Patient refused  Relationships  . Social Herbalist on phone: Patient refused    Gets together: Patient refused    Attends religious service: Patient refused    Active member of club or organization: Patient refused    Attends meetings of clubs or organizations: Patient refused    Relationship status: Patient refused  . Intimate partner violence    Fear of current or ex partner: Patient refused    Emotionally abused: Patient refused    Physically abused: Patient refused    Forced sexual activity: Patient refused  Other Topics Concern  . Not on file  Social History Narrative   Lives in [place] by herself.  Does not routinely exercise.  Works full-time - on her feet all day at work.    Review of Systems    Ongoing fatigue and DOE - worse than usual.  Occasional lower ext edema - esp if sitting all day.  She denies chest pain, palpitations, pnd, orthopnea, n, v, dizziness, syncope, weight gain, or early satiety. No fevers, chills, melena, brbpr, hematuria/dysuria.  All other systems reviewed and are otherwise  negative except as noted above.  Physical Exam    VS:  BP 130/80 (BP Location: Left Arm, Patient Position: Sitting, Cuff Size: Large)   Pulse 99   Ht 5\' 4"  (1.626 m)   Wt (!) 324 lb 4 oz (147.1 kg)   LMP 07/17/2012   SpO2 96%   BMI 55.66 kg/m  , BMI Body mass index is 55.66 kg/m. GEN: Obese, in no acute distress. HEENT: normal. Neck: Supple, obese, difficult to gauge JVP.  No carotid bruits, or masses. Cardiac: Irregularly irregular, no murmurs, rubs, or gallops. No clubbing, cyanosis, trace bilateral ankle edema just above her sock line.  Radials/DP/PT 2+ and equal bilaterally.  Respiratory:  Respirations regular and unlabored, clear to auscultation bilaterally. GI: Obese, soft, nontender, nondistended, BS + x 4. MS: no deformity or atrophy. Skin: warm and dry, no rash. Neuro:  Strength and sensation are intact. Psych: Normal affect.  Accessory Clinical Findings    ECG personally reviewed by me today -atrial fibrillation, 99, nonspecific ST and T flattening changes- no acute changes.  Lab Results  Component Value Date   WBC 13.8 (H) 10/21/2018   HGB 13.3 10/21/2018   HCT 42.0 10/21/2018   MCV 90.1 10/21/2018   PLT 199 10/21/2018   Lab Results  Component Value Date   CREATININE 1.55 (H) 10/30/2018   BUN 33 (H) 10/30/2018   NA 142 10/30/2018   K 3.5 10/30/2018   CL 104 10/30/2018   CO2 27 10/30/2018   Lab Results  Component Value Date   ALT 16 08/30/2018   AST 21 08/30/2018   ALKPHOS 156 (H) 08/30/2018   BILITOT 0.7 08/30/2018   Assessment & Plan    1.  Persistent atrial fibrillation: Patient was hospitalized in early September with pneumonia complicated by atrial fibrillation with rapid ventricular response.  She did have evidence of volume overload and required IV diuresis.  Echocardiogram showed normal LV function with an EF of 60 to 24%, diastolic dysfunction, and an RVSP of 50.7 mmHg.  With clinical improvement in the addition of oral metoprolol, rates  improved and she was subsequently discharged.  She has since been followed in heart failure clinic and last saw Dr. Fletcher Anon via telemedicine visit on September 18.  She has had persistent fatigue and dyspnea on exertion that has been worse than her chronic dyspnea on exertion.  She remains in atrial fibrillation at a rate of 99 today.  I suspect that ongoing A. fib is playing a role in her symptoms.  We discussed options for management including continuation of beta-blocker and Eliquis therapy as well as cardioversion.  She has not missed any Eliquis. We discussed the low risk nature of direct current cardioversion today with risks including VF due to anesthesia or lack of synchronization between the DC shock and the QRS complex, thromboembolus due to insufficient anticoagulant therapy, non-sustained VT, atrial arrhythmia, heart block, bradycardia, transient left bundle branch block, myocardial necrosis, myocardial dysfunction, transient hypotension, pulmonary edema, and skin burn and she is willing to proceed.  She will have COVID testing later this week with plan for cardioversion on Monday.  She will need outpatient sleep study in the future as she does report a history of snoring.  2.  HFpEF: As above, she had evidence of volume overload during hospitalization.  Echo showed normal LV function with elevated RVSP of 50.7 mmHg.  She has required intravenous diuresis as an outpatient but has since been stable on 40 mg of Lasix daily.  She appears relatively euvolemic today.  As above, I think restoration of sinus rhythm will be important as will weight management and work-up for sleep apnea.  F/u bmet today.  3.  Essential hypertension: Stable on beta-blocker, Lasix, amlodipine, and clonidine.  4.  History of PE: On chronic Eliquis.  She has not missed any doses.  5.  Hypothyroidism: On levothyroxine.  TSH was suppressed at 0.065 on September 4 however, free T4 was normal at 0.87.  Defer to primary care.  6.   Snoring: Refer for sleep eval in the setting of above.  7.  Type 2 diabetes mellitus: A1c was 7.1 in September.  Insulin therapy per primary care.  8.  Stage III chronic kidney disease: Follow-up creatinine on Lasix therapy.  9.  Morbid obesity: She would benefit from outpatient nutritional counseling and regular exercise.  10.  Disposition:  F/u labs today.  Cardioversion next week and plan for follow-up in clinic in approximately 2 weeks.  Murray Hodgkins, NP 11/20/2018, 11:46 AM

## 2018-11-20 NOTE — Telephone Encounter (Signed)
Recieved request from : [place]  Forwarded to [employer] for processing

## 2018-11-21 ENCOUNTER — Telehealth: Payer: Self-pay | Admitting: Cardiovascular Disease

## 2018-11-21 ENCOUNTER — Other Ambulatory Visit
Admission: RE | Admit: 2018-11-21 | Discharge: 2018-11-21 | Disposition: A | Discharge status: Home / Self Care | Payer: BC Managed Care – PPO | Source: Ambulatory Visit | Attending: Cardiovascular Disease | Admitting: Cardiovascular Disease

## 2018-11-21 ENCOUNTER — Other Ambulatory Visit: Payer: Self-pay | Admitting: *Deleted

## 2018-11-21 DIAGNOSIS — Z20828 Contact with and (suspected) exposure to other viral communicable diseases: Secondary | ICD-10-CM | POA: Insufficient documentation

## 2018-11-21 DIAGNOSIS — Z01812 Encounter for preprocedural laboratory examination: Secondary | ICD-10-CM | POA: Insufficient documentation

## 2018-11-21 LAB — CBC
Hematocrit: 41 % (ref 34.0–46.6)
Hemoglobin: 13.5 g/dL (ref 11.1–15.9)
MCH: 28.8 pg (ref 26.6–33.0)
MCHC: 32.9 g/dL (ref 31.5–35.7)
MCV: 87 fL (ref 79–97)
Platelets: 272 10*3/uL (ref 150–450)
RBC: 4.69 x10E6/uL (ref 3.77–5.28)
RDW: 14.3 % (ref 11.7–15.4)
WBC: 10.6 10*3/uL (ref 3.4–10.8)

## 2018-11-21 LAB — BASIC METABOLIC PANEL
BUN/Creatinine Ratio: 21 (ref 9–23)
BUN: 35 mg/dL — ABNORMAL HIGH (ref 6–24)
CO2: 25 mmol/L (ref 20–29)
Calcium: 9.5 mg/dL (ref 8.7–10.2)
Chloride: 101 mmol/L (ref 96–106)
Creatinine, Ser: 1.69 mg/dL — ABNORMAL HIGH (ref 0.57–1.00)
GFR calc Af Amer: 39 mL/min/{1.73_m2} — ABNORMAL LOW (ref >59)
GFR calc non Af Amer: 34 mL/min/{1.73_m2} — ABNORMAL LOW (ref >59)
Glucose: 113 mg/dL — ABNORMAL HIGH (ref 65–99)
Potassium: 4.1 mmol/L (ref 3.5–5.2)
Sodium: 144 mmol/L (ref 134–144)

## 2018-11-21 MED ORDER — METOPROLOL TARTRATE 50 MG PO TABS: 50.0000 mg | ORAL_TABLET | Freq: Two times a day (BID) | ORAL | 1 refills | Status: DC

## 2018-11-21 NOTE — Telephone Encounter (Signed)
Requested Prescriptions   Signed Prescriptions Disp Refills  . metoprolol tartrate (LOPRESSOR) 50 MG tablet 180 tablet 1    Sig: Take 1 tablet (50 mg total) by mouth 2 (two) times daily.    Authorizing Provider: Kathlyn Sacramento A    Ordering User: Britt Bottom

## 2018-11-21 NOTE — Telephone Encounter (Signed)
Returned the patient's call x2. Attempted to leave a voice mail as requested. Both times the line disconnected in the middle of me attempting to leave the message.

## 2018-11-21 NOTE — Telephone Encounter (Signed)
Patient calling  Patient has a cardioversion soon and would like to know if a visitor will be allowed to go with and would like to know about how long the procedure will take  Please call to discuss - patient may be out this afternoon - Ok to leave a message

## 2018-11-21 NOTE — Telephone Encounter (Signed)
Mychart message sent to the patient with response to her questions.

## 2018-11-21 NOTE — Telephone Encounter (Signed)
°*  STAT* If patient is at the pharmacy, call can be transferred to refill team.   1. Which medications need to be refilled? (please list name of each medication and dose if known) metoprolol tartrate 60 MG 1 tablet 2 times daily   2. Which pharmacy/location (including street and city if local pharmacy) is medication to be sent to? [facility]   3. Do they need a 30 day or 90 day supply? 90 day   Patient has been having difficulty getting medication.  Lots of emails saying that they cannot get a hold of office.  Please advise. Patient does have a small supply currently.

## 2018-11-22 ENCOUNTER — Telehealth: Payer: Self-pay

## 2018-11-22 LAB — SARS CORONAVIRUS 2 (TAT 6-24 HRS): SARS Coronavirus 2: NEGATIVE

## 2018-11-22 NOTE — Telephone Encounter (Signed)
Incoming call from patient after receiving mychart message regarding need to reschedule cardioversion.   Pt verbalized understanding and rescheduled for next Thursday 10/15 with Dr. Rockey Situ at 7:30 AM. Let patient know that she would need a repeat Covid test on Monday [facility].   Pt verbalized understanding and appreciative of call.   Advised pt to call for any further questions or concerns.   Staff message sent.

## 2018-11-25 ENCOUNTER — Other Ambulatory Visit: Payer: Self-pay

## 2018-11-25 ENCOUNTER — Other Ambulatory Visit
Admission: RE | Admit: 2018-11-25 | Discharge: 2018-11-25 | Disposition: A | Discharge status: Home / Self Care | Payer: BC Managed Care – PPO | Source: Ambulatory Visit | Attending: Cardiovascular Disease | Admitting: Cardiovascular Disease

## 2018-11-25 DIAGNOSIS — Z20828 Contact with and (suspected) exposure to other viral communicable diseases: Secondary | ICD-10-CM | POA: Diagnosis not present

## 2018-11-25 DIAGNOSIS — Z01812 Encounter for preprocedural laboratory examination: Secondary | ICD-10-CM | POA: Insufficient documentation

## 2018-11-26 LAB — SARS CORONAVIRUS 2 (TAT 6-24 HRS): SARS Coronavirus 2: NEGATIVE

## 2018-11-28 ENCOUNTER — Ambulatory Visit
Admission: RE | Admit: 2018-11-28 | Discharge: 2018-11-28 | Disposition: A | Discharge status: Home / Self Care | Payer: BC Managed Care – PPO | Source: Ambulatory Visit | Attending: Cardiovascular Disease | Admitting: Cardiovascular Disease

## 2018-11-28 ENCOUNTER — Ambulatory Visit: Payer: BC Managed Care – PPO | Admitting: Registered Nurse

## 2018-11-28 ENCOUNTER — Other Ambulatory Visit: Payer: Self-pay

## 2018-11-28 ENCOUNTER — Encounter
Admission: RE | Disposition: A | Discharge status: Home / Self Care | Payer: Self-pay | Source: Ambulatory Visit | Attending: Cardiovascular Disease

## 2018-11-28 DIAGNOSIS — I130 Hypertensive heart and chronic kidney disease with heart failure and stage 1 through stage 4 chronic kidney disease, or unspecified chronic kidney disease: Secondary | ICD-10-CM | POA: Diagnosis not present

## 2018-11-28 DIAGNOSIS — Z79899 Other long term (current) drug therapy: Secondary | ICD-10-CM | POA: Diagnosis not present

## 2018-11-28 DIAGNOSIS — Z6841 Body Mass Index (BMI) 40.0 and over, adult: Secondary | ICD-10-CM | POA: Diagnosis not present

## 2018-11-28 DIAGNOSIS — M1990 Unspecified osteoarthritis, unspecified site: Secondary | ICD-10-CM | POA: Diagnosis not present

## 2018-11-28 DIAGNOSIS — Z853 Personal history of malignant neoplasm of breast: Secondary | ICD-10-CM | POA: Insufficient documentation

## 2018-11-28 DIAGNOSIS — Z79890 Hormone replacement therapy (postmenopausal): Secondary | ICD-10-CM | POA: Insufficient documentation

## 2018-11-28 DIAGNOSIS — Z7901 Long term (current) use of anticoagulants: Secondary | ICD-10-CM | POA: Diagnosis not present

## 2018-11-28 DIAGNOSIS — E1122 Type 2 diabetes mellitus with diabetic chronic kidney disease: Secondary | ICD-10-CM | POA: Diagnosis not present

## 2018-11-28 DIAGNOSIS — R0602 Shortness of breath: Secondary | ICD-10-CM | POA: Diagnosis not present

## 2018-11-28 DIAGNOSIS — N183 Chronic kidney disease, stage 3 unspecified: Secondary | ICD-10-CM | POA: Diagnosis not present

## 2018-11-28 DIAGNOSIS — I5032 Chronic diastolic (congestive) heart failure: Secondary | ICD-10-CM | POA: Diagnosis not present

## 2018-11-28 DIAGNOSIS — E039 Hypothyroidism, unspecified: Secondary | ICD-10-CM | POA: Diagnosis not present

## 2018-11-28 DIAGNOSIS — I4819 Other persistent atrial fibrillation: Secondary | ICD-10-CM | POA: Diagnosis present

## 2018-11-28 DIAGNOSIS — Z86711 Personal history of pulmonary embolism: Secondary | ICD-10-CM | POA: Diagnosis not present

## 2018-11-28 HISTORY — PX: CARDIOVERSION: SHX1299

## 2018-11-28 LAB — GLUCOSE, CAPILLARY: Glucose-Capillary: 103 mg/dL — ABNORMAL HIGH (ref 70–99)

## 2018-11-28 SURGERY — CARDIOVERSION
Anesthesia: General

## 2018-11-28 MED ORDER — SODIUM CHLORIDE 0.9 % IV SOLN
INTRAVENOUS | Status: DC | PRN
  Administered 2018-11-28: 07:00:00 via INTRAVENOUS

## 2018-11-28 MED ORDER — PROPOFOL 10 MG/ML IV BOLUS
INTRAVENOUS | Status: DC | PRN
  Administered 2018-11-28: 30 mg via INTRAVENOUS
  Administered 2018-11-28: 60 mg via INTRAVENOUS

## 2018-11-28 NOTE — Discharge Instructions (Signed)
Moderate Conscious Sedation, Adult, Care After These instructions provide you with information about caring for yourself after your procedure. Your health care provider may also give you more specific instructions. Your treatment has been planned according to current medical practices, but problems sometimes occur. Call your health care provider if you have any problems or questions after your procedure. What can I expect after the procedure? After your procedure, it is common:  To feel sleepy for several hours.  To feel clumsy and have poor balance for several hours.  To have poor judgment for several hours.  To vomit if you eat too soon. Follow these instructions at home: For at least 24 hours after the procedure:   Do not: ? Participate in activities where you could fall or become injured. ? Drive. ? Use heavy machinery. ? Drink alcohol. ? Take sleeping pills or medicines that cause drowsiness. ? Make important decisions or sign legal documents. ? Take care of children on your own.  Rest. Eating and drinking  Follow the diet recommended by your health care provider.  If you vomit: ? Drink water, juice, or soup when you can drink without vomiting. ? Make sure you have little or no nausea before eating solid foods. General instructions  Have a responsible adult stay with you until you are awake and alert.  Take over-the-counter and prescription medicines only as told by your health care provider.  If you smoke, do not smoke without supervision.  Keep all follow-up visits as told by your health care provider. This is important. Contact a health care provider if:  You keep feeling nauseous or you keep vomiting.  You feel light-headed.  You develop a rash.  You have a fever. Get help right away if:  You have trouble breathing. This information is not intended to replace advice given to you by your health care provider. Make sure you discuss any questions you have  with your health care provider. Document Released: 11/20/2012 Document Revised: 01/12/2017 Document Reviewed: 05/22/2015 Elsevier Patient Education  [place]. Electrical Cardioversion, Care After This sheet gives you information about how to care for yourself after your procedure. Your health care provider may also give you more specific instructions. If you have problems or questions, contact your health care provider. What can I expect after the procedure? After the procedure, it is common to have:  Some redness on the skin where the shocks were given. Follow these instructions at home:   Do not drive for 24 hours if you were given a medicine to help you relax (sedative).  Take over-the-counter and prescription medicines only as told by your health care provider.  Ask your health care provider how to check your pulse. Check it often.  Rest for 48 hours after the procedure or as told by your health care provider.  Avoid or limit your caffeine use as told by your health care provider. Contact a health care provider if:  You feel like your heart is beating too quickly or your pulse is not regular.  You have a serious muscle cramp that does not go away. Get help right away if:   You have discomfort in your chest.  You are dizzy or you feel faint.  You have trouble breathing or you are short of breath.  Your speech is slurred.  You have trouble moving an arm or leg on one side of your body.  Your fingers or toes turn cold or blue. This information is not intended to  replace advice given to you by your health care provider. Make sure you discuss any questions you have with your health care provider. Document Released: 11/20/2012 Document Revised: 01/12/2017 Document Reviewed: 08/06/2015 Elsevier Patient Education  2020 [employer].

## 2018-11-28 NOTE — Anesthesia Preprocedure Evaluation (Addendum)
Anesthesia Evaluation  Patient identified by MRN, date of birth, ID band Patient awake    Reviewed: Allergy & Precautions, H&P , NPO status , Patient's Chart, lab work & pertinent test results  Airway Mallampati: III  TM Distance: <3 FB Neck ROM: full    Dental  (+) Chipped   Pulmonary neg pulmonary ROS, neg shortness of breath,           Cardiovascular Exercise Tolerance: Good hypertension, (-) angina(-) Past MI and (-) DOE + dysrhythmias Atrial Fibrillation      Neuro/Psych negative neurological ROS  negative psych ROS   GI/Hepatic negative GI ROS, Neg liver ROS, neg GERD  ,  Endo/Other  diabetesHypothyroidism Morbid obesity  Renal/GU CRFRenal disease  negative genitourinary   Musculoskeletal  (+) Arthritis ,   Abdominal   Peds  Hematology negative hematology ROS (+)   Anesthesia Other Findings BMI    Body Mass Index: 54.93 kg/m      Past Medical History: No date: (HFpEF) heart failure with preserved ejection fraction ([place])     Comment:  a. 10/2018 Echo: EF 60-65%, diast dysfxn, RVSP 50.7 mmHg,              mildly dil LA. No date: Anemia No date: Arthritis 2014: Breast cancer ([place])     Comment:  right breast cancer No date: Breast cancer of upper-inner quadrant of right female breast  ([place])     Comment:  Right breast invasive CA and DCIS , 7 mm T1,N0,M0. Er/PR              pos, her 2 negative.  Margins 1 mm. No date: CKD (chronic kidney disease), stage III No date: Diabetes mellitus without complication (HCC) No date: Essential hypertension No date: Gout No date: Hypothyroidism No date: Menopause     Comment:  age 53 No date: Morbid obesity ([place]) No date: Persistent atrial fibrillation ([place])     Comment:  a. Dx 10/2018 in setting of PNA. CHA2DS2VASc =               4-->Eliquis. No date: Personal history of radiation therapy 10/2014: Pulmonary embolism (HCC)     Comment:  a. Chronic eliquis. No  date: Thyroid goiter  Past Surgical History: 2014: BREAST BIOPSY; Right     Comment:  breast ca 07/19/2012: BREAST EXCISIONAL BIOPSY; Right     Comment:  breast ca 2014: BREAST SURGERY; Right     Comment:  with sentinel node bx subareaolar duct excision 01/30/2017: COLONOSCOPY WITH PROPOFOL; N/A     Comment:  Procedure: COLONOSCOPY WITH PROPOFOL;  Surgeon: Christene Lye, MD;  Location: [facility] ENDOSCOPY;  Service:               Endoscopy;  Laterality: N/A;     Reproductive/Obstetrics negative OB ROS                            Anesthesia Physical Anesthesia Plan  ASA: III  Anesthesia Plan: General   Post-op Pain Management:    Induction: Intravenous  PONV Risk Score and Plan: Propofol infusion and TIVA  Airway Management Planned: Natural Airway and Nasal Cannula  Additional Equipment:   Intra-op Plan:   Post-operative Plan:   Informed Consent: I have reviewed the patients History and Physical, chart, labs and discussed the procedure including the risks, benefits and alternatives for the  proposed anesthesia with the patient or authorized representative who has indicated his/her understanding and acceptance.     Dental Advisory Given  Plan Discussed with: Anesthesiologist, CRNA and Surgeon  Anesthesia Plan Comments: (Patient consented for risks of anesthesia including but not limited to:  - adverse reactions to medications - risk of intubation if required - damage to teeth, lips or other oral mucosa - sore throat or hoarseness - Damage to heart, brain, lungs or loss of life  Patient voiced understanding.)        Anesthesia Quick Evaluation

## 2018-11-28 NOTE — H&P (Signed)
H&P Addendum, pre-cardioversion  Patient was seen and evaluated prior to -cardioversion procedure Symptoms, prior testing details again confirmed with the patient Patient examined, no significant change from prior exam Lab work reviewed in detail personally by myself Patient understands risk and benefit of the procedure, willing to proceed  Signed, Tim Gollan, MD, Ph.D [facility]  

## 2018-11-28 NOTE — Anesthesia Post-op Follow-up Note (Signed)
Anesthesia QCDR form completed.        

## 2018-11-28 NOTE — Anesthesia Postprocedure Evaluation (Signed)
Anesthesia Post Note  Patient: Nicole Schmidt  Procedure(s) Performed: CARDIOVERSION (N/A )  Patient location during evaluation: Cath Lab Anesthesia Type: General Level of consciousness: awake and alert Pain management: pain level controlled Vital Signs Assessment: post-procedure vital signs reviewed and stable Respiratory status: spontaneous breathing, nonlabored ventilation, respiratory function stable and patient connected to nasal cannula oxygen Cardiovascular status: blood pressure returned to baseline and stable Postop Assessment: no apparent nausea or vomiting Anesthetic complications: no     Last Vitals:  Vitals:   11/28/18 0800 11/28/18 0815  BP: 132/75 133/78  Pulse: 64 60  Resp: 15 10  Temp:    SpO2: 96% 97%    Last Pain:  Vitals:   11/28/18 0800  TempSrc:   PainSc: 0-No pain                 Precious Haws Donella Pascarella

## 2018-11-28 NOTE — Transfer of Care (Signed)
Immediate Anesthesia Transfer of Care Note  Patient: Nicole Schmidt  Procedure(s) Performed: CARDIOVERSION (N/A )  Patient Location: PACU  Anesthesia Type:General  Level of Consciousness: sedated  Airway & Oxygen Therapy: Patient Spontanous Breathing and Patient connected to nasal cannula oxygen  Post-op Assessment: Report given to RN and Post -op Vital signs reviewed and stable  Post vital signs: Reviewed and stable  Last Vitals:  Vitals Value Taken Time  BP 129/86 11/28/18 0743  Temp    Pulse 65 11/28/18 0744  Resp 21 11/28/18 0744  SpO2 97 % 11/28/18 0744  Vitals shown include unvalidated device data.  Last Pain:  Vitals:   11/28/18 0716  TempSrc: Oral  PainSc: 0-No pain         Complications: No apparent anesthesia complications

## 2018-11-28 NOTE — CV Procedure (Signed)
Cardioversion procedure note For atrial fibrillation, persistent  Procedure Details:  Consent: Risks of procedure as well as the alternatives and risks of each were explained to the (patient/caregiver). Consent for procedure obtained.  Time Out: Verified patient identification, verified procedure, site/side was marked, verified correct patient position, special equipment/implants available, medications/allergies/relevent history reviewed, required imaging and test results available. Performed  Patient placed on cardiac monitor, pulse oximetry, supplemental oxygen as necessary.  Sedation given: propofol IV, Dr. Amie Critchley Pacer pads placed anterior and posterior chest.   Cardioverted 3 time(s).  Cardioverted at  150J, 200J with compression, 200J with compression. Synchronized biphasic Converted to NSR   Evaluation: Findings: Post procedure EKG shows: NSR Complications: None Patient did tolerate procedure well.  Time Spent Directly with the Patient:  25 minutes   Esmond Plants, M.D., Ph.D.

## 2018-12-04 ENCOUNTER — Telehealth: Payer: Self-pay | Admitting: Family

## 2018-12-04 NOTE — Progress Notes (Signed)
Cardiology Office Note    Date:  12/09/2018   ID:  Nicole Schmidt, DOB [birth year], MRN [id number]  PCP:  Lenard Simmer, MD  Cardiologist:  Kathlyn Sacramento, MD  Electrophysiologist:  None   Chief Complaint: Follow-up  History of Present Illness:   Nicole Schmidt is a 53 y.o. female with history of persistent A. fib on Eliquis status post recent DCCV on 11/28/2018, PE on Eliquis, HFpEF, pulmonary hypertension, CKD stage III, breast cancer status post radiation therapy, diabetes, HTN, anemia of chronic disease, obesity, and hypothyroidism who presents for follow-up of recent cardioversion.  Patient was admitted to the hospital in 10/2018 with dyspnea and found to be in A. fib with RVR with ventricular rates in the 130s bpm.  She was also diagnosed CAP at that time.  Echo showed normal LV systolic function with an EF of 60 to 12%, diastolic dysfunction, and RVSP of 50.7 mmHg.  She was IV diuresed.  Plan was made for outpatient cardioversion following rate control.  She was seen in the office on 11/20/2018 and continued to struggle with generalized malaise, fatigue, and DOE that she felt like was out of proportion than what she should expect.  Lower extremity swelling improved after her Lasix was increased to 40 mg daily.  She remained in A. fib with a ventricular rate documented at 99 bpm.  She reported compliance with anticoagulation.  She appeared relatively euvolemic.  It was felt restoration of sinus rhythm would be important for weight management and overall symptoms.  Patient underwent successful DCCV on 11/28/2018, requiring 3 shocks with discharge EKG demonstrating sinus rhythm.  She comes in doing reasonably well.  Since undergoing successful cardioversion she has noted an improvement in her overall stamina.  However, she does continue to note shortness of breath.  Her weight is up 11 pounds today compared to her visit earlier this month.  She has been compliant with all medications  including anticoagulation.  No falls, BRBPR, or melena.  She did contact the [place] [facility] on 12/04/2018 noting a 3 pound weight gain overnight and 7 pounds in the prior week.  In this setting, she was advised to take an extra Lasix 40 mg daily x1 along with KCl 20 mEq x 1.  Patient did note symptomatic improvement in weight and shortness of breath with this.  She continues to remain out of work in the setting of vigorous job description in the setting of the above ongoing issues.   Labs: 11/2018 - potassium 4.1, BUN 35, SCr 1.69, HGB 13.5, PLT 272 10/2018 - TSH 0.065, free T4  Normal, A1c 7.1 08/2018 - AST/ALTnormal  Past Medical History:  Diagnosis Date   (HFpEF) heart failure with preserved ejection fraction ([place])    a. 10/2018 Echo: EF 60-65%, diast dysfxn, RVSP 50.7 mmHg, mildly dil LA.   Anemia    Arthritis    Breast cancer ([place]) 2014   right breast cancer   Breast cancer of upper-inner quadrant of right female breast ([place])    Right breast invasive CA and DCIS , 7 mm T1,N0,M0. Er/PR pos, her 2 negative.  Margins 1 mm.   CKD (chronic kidney disease), stage III    Diabetes mellitus without complication (HCC)    Essential hypertension    Gout    Hypothyroidism    Menopause    age 15   Morbid obesity ([place])    Persistent atrial fibrillation ([place])    a. Dx 10/2018 in setting of PNA.  CHA2DS2VASc = 4-->Eliquis.   Personal history of radiation therapy    Pulmonary embolism ([place]) 10/2014   a. Chronic eliquis.   Thyroid goiter     Past Surgical History:  Procedure Laterality Date   BREAST BIOPSY Right 2014   breast ca   BREAST EXCISIONAL BIOPSY Right 07/19/2012   breast ca   BREAST SURGERY Right 2014   with sentinel node bx subareaolar duct excision   CARDIOVERSION N/A 11/28/2018   Procedure: CARDIOVERSION;  Surgeon: Minna Merritts, MD;  Location: [facility] ORS;  Service: Cardiovascular;  Laterality: N/A;   COLONOSCOPY WITH PROPOFOL N/A 01/30/2017    Procedure: COLONOSCOPY WITH PROPOFOL;  Surgeon: Christene Lye, MD;  Location: [facility] ENDOSCOPY;  Service: Endoscopy;  Laterality: N/A;    Current Medications: Current Meds  Medication Sig   acetaminophen (TYLENOL) 500 MG tablet Take 1,000 mg by mouth every 6 (six) hours as needed (for pain).   albuterol (VENTOLIN HFA) 108 (90 Base) MCG/ACT inhaler Inhale 2 puffs into the lungs every 6 (six) hours as needed for wheezing or shortness of breath.   amLODipine (NORVASC) 5 MG tablet Take 5 mg by mouth 2 (two) times daily.   apixaban (ELIQUIS) 5 MG TABS tablet Take 1 tablet (5 mg total) by mouth 2 (two) times daily.   atorvastatin (LIPITOR) 20 MG tablet Take 20 mg by mouth at bedtime.    calcium carbonate (OSCAL) 1500 (600 Ca) MG TABS tablet Take 600 mg of elemental calcium by mouth daily.    Cholecalciferol (VITAMIN D3) 50000 units TABS Take 50,000 Units by mouth every Friday.    CINNAMON PO Take 1,000 mg by mouth daily.    cloNIDine (CATAPRES) 0.2 MG tablet Take 1 tablet (0.2 mg total) by mouth 2 (two) times daily.   DM-APAP-CPM (CORICIDIN HBP) 10-325-2 MG TABS Take 1-2 tablets by mouth 3 (three) times daily as needed (cold symptoms).   Dulaglutide (TRULICITY) 1.5 QQ/2.2LN SOPN Inject 1.5 mg into the skin every Monday. In the evening   fluticasone-salmeterol (ADVAIR HFA) 230-21 MCG/ACT inhaler Inhale 2 puffs into the lungs 2 (two) times daily.   folic acid (FOLVITE) 989 MCG tablet Take 800 mcg by mouth daily.    furosemide (LASIX) 20 MG tablet Take 40 mg by mouth daily.    levothyroxine (SYNTHROID, LEVOTHROID) 112 MCG tablet Take 112 mcg daily before breakfast by mouth.   metoprolol tartrate (LOPRESSOR) 50 MG tablet Take 1 tablet (50 mg total) by mouth 2 (two) times daily.   nystatin (NYSTATIN) powder Apply 1 g topically 2 (two) times daily as needed (rash (skin folds)).   potassium chloride SA (KLOR-CON) 20 MEQ tablet Take 1 tablet (20 mEq total) by mouth daily. (Patient  taking differently: Take 20 mEq by mouth daily after breakfast. )   vitamin C (ASCORBIC ACID) 500 MG tablet Take 500 mg by mouth 2 (two) times daily.    Allergies:   Patient has no known allergies.   Social History   Socioeconomic History   Marital status: Single    Spouse name: Not on file   Number of children: Not on file   Years of education: Not on file   Highest education level: Not on file  Occupational History   Not on file  Social Needs   Financial resource strain: Patient refused   Food insecurity    Worry: Patient refused    Inability: Patient refused   Transportation needs    Medical: Patient refused    Non-medical: Patient refused  Tobacco Use   Smoking status: Never Smoker   Smokeless tobacco: Never Used  Substance and Sexual Activity   Alcohol use: No    Alcohol/week: 0.0 standard drinks   Drug use: No   Sexual activity: Not on file  Lifestyle   Physical activity    Days per week: Patient refused    Minutes per session: Patient refused   Stress: Patient refused  Relationships   Social connections    Talks on phone: Patient refused    Gets together: Patient refused    Attends religious service: Patient refused    Active member of club or organization: Patient refused    Attends meetings of clubs or organizations: Patient refused    Relationship status: Patient refused  Other Topics Concern   Not on file  Social History Narrative   Lives in [place] by herself.  Does not routinely exercise.  Works full-time - on her feet all day at work.     Family History:  The patient's family history includes Atrial fibrillation in her mother; Colon cancer (age of onset: 29) in her cousin; Diabetes in her father; Heart attack in her mother; Heart failure in her mother; Hypertension in her father and mother; Parkinson's disease in her father; Rheum arthritis in her mother. There is no history of Breast cancer.  ROS:   Review of Systems    Constitutional: Positive for malaise/fatigue. Negative for chills, diaphoresis, fever and weight loss.  HENT: Negative for congestion.   Eyes: Negative for discharge and redness.  Respiratory: Positive for shortness of breath. Negative for cough, hemoptysis, sputum production and wheezing.   Cardiovascular: Positive for leg swelling. Negative for chest pain, palpitations, orthopnea, claudication and PND.  Gastrointestinal: Negative for abdominal pain, blood in stool, heartburn, melena, nausea and vomiting.  Genitourinary: Negative for hematuria.  Musculoskeletal: Negative for falls and myalgias.  Skin: Negative for rash.  Neurological: Positive for weakness. Negative for dizziness, tingling, tremors, sensory change, speech change, focal weakness and loss of consciousness.  Endo/Heme/Allergies: Does not bruise/bleed easily.  Psychiatric/Behavioral: Negative for substance abuse. The patient is not nervous/anxious.   All other systems reviewed and are negative.    EKGs/Labs/Other Studies Reviewed:    Studies reviewed were summarized above. The additional studies were reviewed today: As above.   EKG:  EKG is ordered today.  The EKG ordered today demonstrates NSR, 74 bpm, LVH, poor R wave progression along the precordial leads, no acute ST-T changes  Recent Labs: 03/29/2018: Magnesium 1.9 08/30/2018: ALT 16 10/18/2018: TSH 0.065 10/30/2018: B Natriuretic Peptide 156.0 11/20/2018: BUN 35; Creatinine, Ser 1.69; Hemoglobin 13.5; Platelets 272; Potassium 4.1; Sodium 144  Recent Lipid Panel No results found for: CHOL, TRIG, HDL, CHOLHDL, VLDL, LDLCALC, LDLDIRECT  PHYSICAL EXAM:    VS:  BP 110/62 (BP Location: Left Arm, Patient Position: Sitting, Cuff Size: Normal)    Pulse 74    Temp 98.6 F (37 C)    Ht 5\' 4"  (1.626 m)    Wt (!) 335 lb (152 kg)    LMP 07/17/2012    BMI 57.50 kg/m   BMI: Body mass index is 57.5 kg/m.  Physical Exam  Constitutional: She is oriented to person, place, and time.  She appears well-developed and well-nourished.  HENT:  Head: Normocephalic and atraumatic.  Eyes: Right eye exhibits no discharge. Left eye exhibits no discharge.  Neck: Normal range of motion. No JVD present.  Cardiovascular: Normal rate, regular rhythm, S1 normal, S2 normal and normal heart sounds. Exam  reveals no distant heart sounds, no friction rub, no midsystolic click and no opening snap.  No murmur heard. Pulses:      Posterior tibial pulses are 2+ on the right side and 2+ on the left side.  Pulmonary/Chest: Effort normal and breath sounds normal. No respiratory distress. She has no decreased breath sounds. She has no wheezes. She has no rales. She exhibits no tenderness.  Abdominal: Soft. She exhibits no distension. There is no abdominal tenderness.  Musculoskeletal:        General: No edema.  Neurological: She is alert and oriented to person, place, and time.  Skin: Skin is warm and dry. No cyanosis. Nails show no clubbing.  Psychiatric: She has a normal mood and affect. Her speech is normal and behavior is normal. Judgment and thought content normal.    Wt Readings from Last 3 Encounters:  12/09/18 (!) 335 lb (152 kg)  11/28/18 (!) 320 lb (145.2 kg)  11/20/18 (!) 324 lb 4 oz (147.1 kg)     ASSESSMENT & PLAN:   1. Persistent A. Fib: Maintaining sinus rhythm status post recent successful DCCV on 11/28/2018.  She has noted improvement with symptomatic fatigue following restoration of sinus rhythm.  Continue Lopressor and Eliquis.  CHADS2VASc at least 4 (CHF, HTN, diabetes, female).  She does not meet reduced dosing criteria of Eliquis.  No symptoms concerning for bleeding.  Check BMP and CBC.  2. HFpEF/pulmonary hypertension: Patient shortness of breath is likely multifactorial including diastolic CHF, pulmonary hypertension, prior PE, morbid obesity, and physical deconditioning.  Recent escalation of Lasix to 40 mg daily did demonstrate some symptomatic improvement.  However,  with this, there was noted slight worsening in renal function.  She remains on Lasix 40 mg daily at this time.  Check BMP and CBC today.  Further recommendations regarding diuretic therapy pending labs.  Given the patient's weight is up 11 pounds today when compared to her last visit, and in the setting of worsening renal function, she will continue to be out of work while her volume status is further assessed with close follow-up.  3. HTN: Blood pressure is well controlled today.  Continue current therapy.  4. History of PE: Remains on Eliquis.  Check CBC as above.  5. Sleep disordered breathing: Patient did not hear from prior referral.  Refer for sleep study.  This will be important in managing her A. fib as well as her pulmonary hypertension.  Cannot exclude some component of OHS.  6. Acute on CKD stage III: Check BMP.  7. Morbid obesity: Weight loss is recommended.  Consider referral to nutritionist.  8. Hypothyroidism: TSH was low in 10/2018 with normal free T4.  Follow-up with PCP as directed.  9. Insulin-dependent diabetes: Follow-up with PCP as directed.  Disposition: F/u with Dr. Fletcher Anon or an APP in 1 month, sooner if needed based on labs.   Medication Adjustments/Labs and Tests Ordered: Current medicines are reviewed at length with the patient today.  Concerns regarding medicines are outlined above. Medication changes, Labs and Tests ordered today are summarized above and listed in the Patient Instructions accessible in Encounters.   Signed, Christell Faith, PA-C 12/09/2018 10:08 AM     [facility] [address] [place] [address] [place], [place] [address] [phone]

## 2018-12-04 NOTE — Telephone Encounter (Signed)
Patient called to say that she had gained 3 pounds overnight and 7 pounds in the last week. She is watching her sodium and fluid intake carefully. She is currently taking 40mg  furosemide/ 90meq potassium daily.   Most recent lab work on 11/20/2018 showed potassium of 4.1, creatinine 1.69 and GFR 34.   Patient recently had cardioversion done 11/28/2018 and returns to cardiology on 12/09/2018.  Advised patient to take an additional 40mg  furosemide/ 70meq potassium today and make sure she keeps her cardiology appointment. Did advise patient that I would be out of town the next 2 days so if her weight does not decline, that she should call her cardiology office. Patient verbalized understanding.

## 2018-12-05 ENCOUNTER — Encounter: Payer: Self-pay | Admitting: *Deleted

## 2018-12-05 NOTE — Addendum Note (Signed)
Addended by: Verlon Au on: 12/05/2018 02:53 PM   Modules accepted: Orders

## 2018-12-06 ENCOUNTER — Ambulatory Visit: Payer: BC Managed Care – PPO | Admitting: Physician Assistant

## 2018-12-09 ENCOUNTER — Other Ambulatory Visit: Payer: Self-pay

## 2018-12-09 ENCOUNTER — Encounter: Payer: Self-pay | Admitting: Physician Assistant

## 2018-12-09 ENCOUNTER — Ambulatory Visit (INDEPENDENT_AMBULATORY_CARE_PROVIDER_SITE_OTHER): Payer: BC Managed Care – PPO | Admitting: Physician Assistant

## 2018-12-09 VITALS — BP 110/62 | HR 74 | Temp 98.6°F | Ht 64.0 in | Wt 335.0 lb

## 2018-12-09 DIAGNOSIS — N179 Acute kidney failure, unspecified: Secondary | ICD-10-CM

## 2018-12-09 DIAGNOSIS — I5032 Chronic diastolic (congestive) heart failure: Secondary | ICD-10-CM | POA: Diagnosis not present

## 2018-12-09 DIAGNOSIS — Z86711 Personal history of pulmonary embolism: Secondary | ICD-10-CM

## 2018-12-09 DIAGNOSIS — I272 Pulmonary hypertension, unspecified: Secondary | ICD-10-CM

## 2018-12-09 DIAGNOSIS — G4730 Sleep apnea, unspecified: Secondary | ICD-10-CM

## 2018-12-09 DIAGNOSIS — I4819 Other persistent atrial fibrillation: Secondary | ICD-10-CM | POA: Diagnosis not present

## 2018-12-09 DIAGNOSIS — N183 Chronic kidney disease, stage 3 unspecified: Secondary | ICD-10-CM

## 2018-12-09 DIAGNOSIS — I10 Essential (primary) hypertension: Secondary | ICD-10-CM

## 2018-12-09 NOTE — Addendum Note (Signed)
Addended by: Verlon Au on: 12/09/2018 10:41 AM   Modules accepted: Orders

## 2018-12-09 NOTE — Patient Instructions (Signed)
Medication Instructions:  Your physician recommends that you continue on your current medications as directed. Please refer to the Current Medication list given to you today.  *If you need a refill on your cardiac medications before your next appointment, please call your pharmacy*  Lab Work: Your physician recommends that you return for lab work today at the medical mall. (CBC, BMET) No appt is needed. Hours are M-F 7AM- 6 PM.  If you have labs (blood work) drawn today and your tests are completely normal, you will receive your results only by: Marland Kitchen MyChart Message (if you have MyChart) OR . A paper copy in the mail If you have any lab test that is abnormal or we need to change your treatment, we will call you to review the results.  Testing/Procedures: 1- ref to Pulmonology for sleep study  Follow-Up: At [facility], you and your health needs are our priority.  As part of our continuing mission to provide you with exceptional heart care, we have created designated Provider Care Teams.  These Care Teams include your primary Cardiologist (physician) and Advanced Practice Providers (APPs -  Physician Assistants and Nurse Practitioners) who all work together to provide you with the care you need, when you need it.  Your next appointment:   1 month  The format for your next appointment:   In Person  Provider:    You may see Kathlyn Sacramento, MD or Christell Faith, PA-C.

## 2018-12-10 ENCOUNTER — Telehealth: Payer: Self-pay

## 2018-12-10 LAB — CBC
Hematocrit: 40.4 % (ref 34.0–46.6)
Hemoglobin: 13.3 g/dL (ref 11.1–15.9)
MCH: 29.1 pg (ref 26.6–33.0)
MCHC: 32.9 g/dL (ref 31.5–35.7)
MCV: 88 fL (ref 79–97)
Platelets: 208 10*3/uL (ref 150–450)
RBC: 4.57 x10E6/uL (ref 3.77–5.28)
RDW: 14.4 % (ref 11.7–15.4)
WBC: 9.7 10*3/uL (ref 3.4–10.8)

## 2018-12-10 LAB — BASIC METABOLIC PANEL
BUN/Creatinine Ratio: 27 — ABNORMAL HIGH (ref 9–23)
BUN: 35 mg/dL — ABNORMAL HIGH (ref 6–24)
CO2: 25 mmol/L (ref 20–29)
Calcium: 9.4 mg/dL (ref 8.7–10.2)
Chloride: 103 mmol/L (ref 96–106)
Creatinine, Ser: 1.29 mg/dL — ABNORMAL HIGH (ref 0.57–1.00)
GFR calc Af Amer: 54 mL/min/{1.73_m2} — ABNORMAL LOW (ref >59)
GFR calc non Af Amer: 47 mL/min/{1.73_m2} — ABNORMAL LOW (ref >59)
Glucose: 135 mg/dL — ABNORMAL HIGH (ref 65–99)
Potassium: 4.1 mmol/L (ref 3.5–5.2)
Sodium: 144 mmol/L (ref 134–144)

## 2018-12-10 NOTE — Telephone Encounter (Signed)
Received call back from patient. Relayed information from [employer], [place].  No further orders or questions at this time.   Advised pt to call for any further questions or concerns.

## 2018-12-10 NOTE — Telephone Encounter (Signed)
For now, we can justify the patient being out of work until her follow-up visit in early 12/2018.  Hopefully, we will be able to get her back to work shortly thereafter.

## 2018-12-10 NOTE — Telephone Encounter (Signed)
-----   Message from Rise Mu, PA-C sent at 12/10/2018  7:46 AM EDT ----- Renal function is improved. Potassium at goal. Blood count normal.  Given patient's weight was 11 pounds up at her most recent visit, please have patient increase Lasix to 40 mg twice daily for 2 days followed by resumption of 40 mg daily thereafter.  Continue current dose of KCl.  Please move up her follow-up appointment to be seen in 2 weeks to reassess volume status.  Follow-up BMP can be obtained at that time.

## 2018-12-10 NOTE — Telephone Encounter (Signed)
Call to patient to review results of lab work.   Discussed POC from Christell Faith, [place]. Pt verbalized understanding and in agreement with POC> .   Med change updated.   appt moved to 2 week f/u.   Pt asking about disability paperwork. She had originally asked for extension to dec (when follow up was). Pt wanting to know which date she needs to request for.  Routing to [employer], [place] to further advise.

## 2018-12-10 NOTE — Telephone Encounter (Signed)
LMTCB

## 2018-12-20 NOTE — Telephone Encounter (Signed)
Received forms from [facility] for physician to complete, placed in nurse box

## 2018-12-25 ENCOUNTER — Encounter: Payer: Self-pay | Admitting: Nurse Practitioner

## 2018-12-25 ENCOUNTER — Ambulatory Visit (INDEPENDENT_AMBULATORY_CARE_PROVIDER_SITE_OTHER): Payer: BC Managed Care – PPO | Admitting: Nurse Practitioner

## 2018-12-25 ENCOUNTER — Other Ambulatory Visit: Payer: Self-pay

## 2018-12-25 VITALS — BP 110/80 | HR 76 | Ht 64.0 in | Wt 336.0 lb

## 2018-12-25 DIAGNOSIS — I4819 Other persistent atrial fibrillation: Secondary | ICD-10-CM

## 2018-12-25 DIAGNOSIS — I5032 Chronic diastolic (congestive) heart failure: Secondary | ICD-10-CM | POA: Diagnosis not present

## 2018-12-25 DIAGNOSIS — I10 Essential (primary) hypertension: Secondary | ICD-10-CM

## 2018-12-25 DIAGNOSIS — N1831 Chronic kidney disease, stage 3a: Secondary | ICD-10-CM

## 2018-12-25 DIAGNOSIS — R5381 Other malaise: Secondary | ICD-10-CM

## 2018-12-25 NOTE — Progress Notes (Signed)
Office Visit    Patient Name: Nicole Schmidt Date of Encounter: 12/25/2018  Primary Care Provider:  Lenard Simmer, MD Primary Cardiologist:  Kathlyn Sacramento, MD  Chief Complaint    53 y/o ? w/ a h/o breast cancer s/p radiation, HTN, DMII, CKD III, anemia, obesity, hypothyroidism, PE (10/2014) on chronic eliquis, and HFpEF, who presents for f/u related to Afib s/p DCCV 11/28/2018.  Past Medical History    Past Medical History:  Diagnosis Date  . (HFpEF) heart failure with preserved ejection fraction ([place])    a. 10/2018 Echo: EF 60-65%, diast dysfxn, RVSP 50.7 mmHg, mildly dil LA.  Marland Kitchen Anemia   . Arthritis   . Breast cancer ([place]) 2014   right breast cancer  . Breast cancer of upper-inner quadrant of right female breast ([place])    Right breast invasive CA and DCIS , 7 mm T1,N0,M0. Er/PR pos, her 2 negative.  Margins 1 mm.  . CKD (chronic kidney disease), stage III   . Diabetes mellitus without complication ([place])   . Essential hypertension   . Gout   . Hypothyroidism   . Menopause    age 51  . Morbid obesity ([place])   . Persistent atrial fibrillation ([place])    a. Dx 10/2018 in setting of PNA. CHA2DS2VASc = 4-->Eliquis; b. 11/2018 s/p successful DCCV.  Marland Kitchen Personal history of radiation therapy   . Pulmonary embolism ([place]) 10/2014   a. Chronic eliquis.  . Thyroid goiter    Past Surgical History:  Procedure Laterality Date  . BREAST BIOPSY Right 2014   breast ca  . BREAST EXCISIONAL BIOPSY Right 07/19/2012   breast ca  . BREAST SURGERY Right 2014   with sentinel node bx subareaolar duct excision  . CARDIOVERSION N/A 11/28/2018   Procedure: CARDIOVERSION;  Surgeon: Minna Merritts, MD;  Location: [facility] ORS;  Service: Cardiovascular;  Laterality: N/A;  . COLONOSCOPY WITH PROPOFOL N/A 01/30/2017   Procedure: COLONOSCOPY WITH PROPOFOL;  Surgeon: Christene Lye, MD;  Location: [facility] ENDOSCOPY;  Service: Endoscopy;  Laterality: N/A;    Allergies  No Known Allergies   History of Present Illness    54 y/o ? w/ a h/o breast cancer s/p radiation, HTN, DMII, CKD III, anemia, obesity, hypothyroidism, PE (10/2014) on chronic eliquis, HFpEF, and afib s/p DCCV 11/28/2018.  She was admitted to [facility] in early Sept w/ dyspnea and found to be in AF w/ RVR @ 130.  Echo showed nl EF w/ diast dysfxn and RVSP of 50.39mmHg.  She required IV diuresis and rate control was achieved w/ oral  blocker.  Following d/c, she struggled w/ malaise, fatigue, and DOE.  She was set up for and underwent successful DCCV on 11/28/2018.  She was maintaining sinus rhythm @ f/u on 10/26, however, wt was up 10 lbs and she continued to note DOE.  Renal fxn was stable and she was advised to increase lasix to 40 bid x 2 days and then 40 daily.  Since her last visit, she has continued to note dyspnea with minimal activity but she no longer experiences malaise and fatigue the way that she had previously.  She sees that as an improvement.  Her weight on her home scale has been fairly stable at 330 pounds.  She has continued to note good response to Lasix 40 mg daily and only notes lower extremity swelling after salty meals and if her legs are in a dependent position.  She says that since early September, she  has been very sedentary and in that setting has become quite deconditioned.  She has not resumed usual activities around her house up to this point.  She denies chest pain, palpitations, PND, orthopnea, dizziness, syncope, or early satiety.  Home Medications    Prior to Admission medications   Medication Sig Start Date End Date Taking? Authorizing Provider  acetaminophen (TYLENOL) 500 MG tablet Take 1,000 mg by mouth every 6 (six) hours as needed (for pain).    [provider]  albuterol (VENTOLIN HFA) 108 (90 Base) MCG/ACT inhaler Inhale 2 puffs into the lungs every 6 (six) hours as needed for wheezing or shortness of breath.    [provider]  amLODipine (NORVASC) 5 MG tablet Take 5 mg  by mouth 2 (two) times daily. 09/18/18   [provider]  apixaban (ELIQUIS) 5 MG TABS tablet Take 1 tablet (5 mg total) by mouth 2 (two) times daily. 07/15/18   Lequita Asal, MD  atorvastatin (LIPITOR) 20 MG tablet Take 20 mg by mouth at bedtime.     [provider]  calcium carbonate (OSCAL) 1500 (600 Ca) MG TABS tablet Take 600 mg of elemental calcium by mouth daily.     [provider]  Cholecalciferol (VITAMIN D3) 50000 units TABS Take 50,000 Units by mouth every Friday.     [provider]  CINNAMON PO Take 1,000 mg by mouth daily.     [provider]  cloNIDine (CATAPRES) 0.2 MG tablet Take 1 tablet (0.2 mg total) by mouth 2 (two) times daily. 10/21/18   Mayo, Pete Pelt, MD  DM-APAP-CPM (CORICIDIN HBP) 10-325-2 MG TABS Take 1-2 tablets by mouth 3 (three) times daily as needed (cold symptoms).    [provider]  Dulaglutide (TRULICITY) 1.5 AJ/2.8NO SOPN Inject 1.5 mg into the skin every Monday. In the evening    [provider]  fluticasone-salmeterol (ADVAIR HFA) 230-21 MCG/ACT inhaler Inhale 2 puffs into the lungs 2 (two) times daily.    [provider]  folic acid (FOLVITE) 676 MCG tablet Take 800 mcg by mouth daily.     [provider]  furosemide (LASIX) 20 MG tablet Take 40 mg by mouth daily.    [provider]  levothyroxine (SYNTHROID, LEVOTHROID) 112 MCG tablet Take 112 mcg daily before breakfast by mouth.    [provider]  metoprolol tartrate (LOPRESSOR) 50 MG tablet Take 1 tablet (50 mg total) by mouth 2 (two) times daily. 11/21/18   Wellington Hampshire, MD  nystatin (NYSTATIN) powder Apply 1 g topically 2 (two) times daily as needed (rash (skin folds)).    [provider]  potassium chloride SA (KLOR-CON) 20 MEQ tablet Take 1 tablet (20 mEq total) by mouth daily. Patient taking differently: Take 20 mEq by mouth daily after breakfast.  11/12/18   Alisa Graff, FNP  vitamin  C (ASCORBIC ACID) 500 MG tablet Take 500 mg by mouth 2 (two) times daily.    [provider]    Review of Systems    She continues have dyspnea on exertion but has been very sedentary.  She denies chest pain, palpitations, fatigue/malaise, PND, orthopnea, dizziness, syncope, edema, or early satiety.  All other systems reviewed and are otherwise negative except as noted above.  Physical Exam    VS:  BP 110/80 (BP Location: Left Arm, Patient Position: Sitting, Cuff Size: Large)   Pulse 76   Ht 5\' 4"  (1.626 m)   Wt (!) 336 lb (152.4  kg)   LMP 07/17/2012   SpO2 97%   BMI 57.67 kg/m  , BMI Body mass index is 57.67 kg/m. GEN: Obese, in no acute distress. HEENT: normal. Neck: Supple, obese, difficult to gauge JVP.  No carotid bruits, or masses. Cardiac: RRR, no murmurs, rubs, or gallops. No clubbing, cyanosis, edema.  Radials/PT 2+ and equal bilaterally.  Respiratory:  Respirations regular and unlabored, clear to auscultation bilaterally. GI: Obese, soft, nontender, nondistended, BS + x 4. MS: no deformity or atrophy. Skin: warm and dry, no rash. Neuro:  Strength and sensation are intact. Psych: Normal affect.  Accessory Clinical Findings    ECG personally reviewed by me today -regular sinus rhythm 76- no acute changes.  Lab Results  Component Value Date   WBC 9.7 12/09/2018   HGB 13.3 12/09/2018   HCT 40.4 12/09/2018   MCV 88 12/09/2018   PLT 208 12/09/2018   Lab Results  Component Value Date   CREATININE 1.29 (H) 12/09/2018   BUN 35 (H) 12/09/2018   NA 144 12/09/2018   K 4.1 12/09/2018   CL 103 12/09/2018   CO2 25 12/09/2018   Lab Results  Component Value Date   ALT 16 08/30/2018   AST 21 08/30/2018   ALKPHOS 156 (H) 08/30/2018   BILITOT 0.7 08/30/2018     Assessment & Plan    1.  Persistent atrial fibrillation: Status post cardioversion October 15 and maintaining sinus rhythm.  She has noted improvement in malaise/fatigue but continues to experience  dyspnea on exertion.  I suspect that this is primarily related to deconditioning in the setting of respiratory illness, A. fib, and heart failure dating back to early September.  She remains on beta-blocker and Eliquis (CHA2DS2-VASc equals 4).  2.  HFpEF/pulmonary hypertension: Her weight is up 1 pound since her last visit on our scale but she notes that she was 330 pounds on her home scale this morning.  She does not have any lower extremity edema and her abdomen is soft.  Difficult to gauge JVP secondary to neck girth.  Since September, she has been very sedentary.  She admits to not walking very much at home.  She ambulated around the office today and on room air, she dropped her saturation to 90% and became dyspneic after walking about 40 feet.  I suspect her primary issue at this point, is deconditioning.  Heart rate and blood pressure are stable on current regimen.  Continue Lasix 40 mg daily and follow-up basic metabolic panel today.  I am going to refer her to physical therapy as she does not feel that she is ready to return to work.  3.  Morbid obesity/physical deconditioning: As above, on examination she appears to be euvolemic.  Her weight is up above prior baseline however, she has also been much more sedentary over the past 2+ months.  She appears to be euvolemic on examination.  I am referring to physical therapy due to significant deconditioning.  4.  Essential hypertension: Stable.  5.  History of pulmonary embolus: On Eliquis.  6.  Sleep disordered breathing: She is pending pulmonology appointment December 1 with probable sleep study to follow.  7.  Stage III chronic kidney disease: Follow-up basic metabolic panel today.  8.  Insulin-dependent diabetes mellitus: A1c 7.1 September.  Insulin management per primary care.  9.  Disposition: Follow-up basic metabolic panel today.  Refer for physical therapy secondary to deconditioning.  Follow-up in the clinic in approximately 6 to 8  weeks.  Murray Hodgkins, NP 12/25/2018, 12:14 PM

## 2018-12-25 NOTE — Patient Instructions (Signed)
Medication Instructions:  - Your physician recommends that you continue on your current medications as directed. Please refer to the Current Medication list given to you today.   *If you need a refill on your cardiac medications before your next appointment, please call your pharmacy*  Lab Work: - Your physician recommends that you have lab work today: BMP  If you have labs (blood work) drawn today and your tests are completely normal, you will receive your results only by: Marland Kitchen MyChart Message (if you have MyChart) OR . A paper copy in the mail If you have any lab test that is abnormal or we need to change your treatment, we will call you to review the results.  Testing/Procedures: - You have been referred to: Physical Therapy (for deconditioning)   Follow-Up: At [facility], you and your health needs are our priority.  As part of our continuing mission to provide you with exceptional heart care, we have created designated Provider Care Teams.  These Care Teams include your primary Cardiologist (physician) and Advanced Practice Providers (APPs -  Physician Assistants and Nurse Practitioners) who all work together to provide you with the care you need, when you need it.  Your next appointment:   6 weeks  The format for your next appointment:   In Person  Provider:    You may see Kathlyn Sacramento, MD or one of the following Advanced Practice Providers on your designated Care Team:    Murray Hodgkins, NP  Christell Faith, PA-C  Marrianne Mood, PA-C   Other Instructions - n/a

## 2018-12-26 ENCOUNTER — Telehealth: Payer: Self-pay | Admitting: Nurse Practitioner

## 2018-12-26 DIAGNOSIS — I5032 Chronic diastolic (congestive) heart failure: Secondary | ICD-10-CM

## 2018-12-26 DIAGNOSIS — I4891 Unspecified atrial fibrillation: Secondary | ICD-10-CM

## 2018-12-26 DIAGNOSIS — R5381 Other malaise: Secondary | ICD-10-CM

## 2018-12-26 DIAGNOSIS — N1831 Chronic kidney disease, stage 3a: Secondary | ICD-10-CM

## 2018-12-26 LAB — BASIC METABOLIC PANEL
BUN/Creatinine Ratio: 20 (ref 9–23)
BUN: 35 mg/dL — ABNORMAL HIGH (ref 6–24)
CO2: 24 mmol/L (ref 20–29)
Calcium: 9.6 mg/dL (ref 8.7–10.2)
Chloride: 100 mmol/L (ref 96–106)
Creatinine, Ser: 1.76 mg/dL — ABNORMAL HIGH (ref 0.57–1.00)
GFR calc Af Amer: 37 mL/min/{1.73_m2} — ABNORMAL LOW (ref >59)
GFR calc non Af Amer: 32 mL/min/{1.73_m2} — ABNORMAL LOW (ref >59)
Glucose: 114 mg/dL — ABNORMAL HIGH (ref 65–99)
Potassium: 4 mmol/L (ref 3.5–5.2)
Sodium: 141 mmol/L (ref 134–144)

## 2018-12-26 NOTE — Telephone Encounter (Signed)
I spoke with the patient. I advised her I placed her PT referral for [facility]. I called and spoke with the PT dept to make sure they could see the referral and they could. The patient is aware I have confirmed with PT they will reach out to her directly to arrange for PT services to start.

## 2018-12-26 NOTE — Addendum Note (Signed)
Addended by: Alvis Lemmings C on: 12/26/2018 01:06 PM   Modules accepted: Orders

## 2018-12-26 NOTE — Telephone Encounter (Signed)
I spoke with the patient regarding her lab results. She is aware of Ignacia Bayley, NP's recommendations to: 1) Decrease lasix to 20 mg once daily 2) repeat a BMP in 1 week  The patient states she has lasix 40 mg tablets that her nephrologist sent in for her.  She is ok with cutting these in half for now.  She will let us know if this becomes an issue for her and we can send in the 20 mg tablets.   She is aware to come to the [facility] next Thursday/ Friday to have a BMP done.  Order placed.   She is aware I will continue to work on her PT referral as well.

## 2018-12-26 NOTE — Telephone Encounter (Signed)
Notes recorded by Theora Gianotti, NP on 12/26/2018 at 9:22 AM EST  Creat is up again. As we discussed yesterday, I think her dyspnea at this is largely 2/2 deconditioning and less 2/2 fluid. I think her kidney's are telling us the same thing. Pls have her reduce lasix to 20mg  daily. F/u PT as planned. F/u bmet in a week to assess stability.

## 2019-01-01 ENCOUNTER — Telehealth: Payer: Self-pay | Admitting: Cardiovascular Disease

## 2019-01-01 NOTE — Telephone Encounter (Signed)
Spoke wit the patient. Advised her that the referral has been placed to [facility] and they will contact her directly to schedule.  Advised the patient that Dr. Fletcher Anon will be back in the office tomorrow. Adv the patient that I placed a note on the envelope for Dr. Fletcher Anon to sign asap. Patient would like a call back one completed.

## 2019-01-01 NOTE — Addendum Note (Signed)
Addended by: Lamar Laundry on: 01/01/2019 10:54 AM   Modules accepted: Orders

## 2019-01-01 NOTE — Telephone Encounter (Signed)
Returned the call to [employer]. lmtcb for [employer].

## 2019-01-01 NOTE — Telephone Encounter (Signed)
Spoke with the patient. Patient sts that [facility] recommended she try [facility] in [place] for an early appt.  Patient sts that her employer has been awaiting her temp disability paperwork. Patient sts that she was previously told it has been sitting on Dr. Tyrell Antonio desk for 2 weeks.  Advised the patient that I will contact [facility] and locate her paperwork and call back to discuss. Patient verbalized understanding and voiced appreciation for the assistance.  Contacted [facility] to inquire on their first available appt and how to initiate a referral. Lmtcb.  Spoke with [facility] @ [facility]. They have availability as early as next week. Adah Salvage that I will fax over the referral asap. Fax # [phone]  Phone # [phone] They will contact the patient directly to schedule. Fax confirmation received.

## 2019-01-01 NOTE — Telephone Encounter (Signed)
Spoke with [facility] @ [facility]. Anderson Malta sts that they need to know the onset of the patient Afib dx. Henrene Dodge that it was 10/18/18 and per Ignacia Bayley, NP the patient has no activity restrictions. Anderson Malta voiced appreciation for the information. Nothing further need at the moment.

## 2019-01-01 NOTE — Telephone Encounter (Signed)
Patient asks if there is another option, PT cannot see her until the end of December.

## 2019-01-01 NOTE — Telephone Encounter (Signed)
Please call regarding PT referral.

## 2019-01-02 ENCOUNTER — Inpatient Hospital Stay: Payer: BC Managed Care – PPO | Attending: Hematology and Oncology

## 2019-01-02 ENCOUNTER — Other Ambulatory Visit: Payer: Self-pay

## 2019-01-02 DIAGNOSIS — Z170 Estrogen receptor positive status [ER+]: Secondary | ICD-10-CM | POA: Diagnosis not present

## 2019-01-02 DIAGNOSIS — C50211 Malignant neoplasm of upper-inner quadrant of right female breast: Secondary | ICD-10-CM | POA: Diagnosis present

## 2019-01-02 DIAGNOSIS — R748 Abnormal levels of other serum enzymes: Secondary | ICD-10-CM

## 2019-01-02 DIAGNOSIS — D509 Iron deficiency anemia, unspecified: Secondary | ICD-10-CM | POA: Diagnosis not present

## 2019-01-02 DIAGNOSIS — I2782 Chronic pulmonary embolism: Secondary | ICD-10-CM

## 2019-01-02 DIAGNOSIS — N289 Disorder of kidney and ureter, unspecified: Secondary | ICD-10-CM | POA: Insufficient documentation

## 2019-01-02 DIAGNOSIS — C50911 Malignant neoplasm of unspecified site of right female breast: Secondary | ICD-10-CM

## 2019-01-02 LAB — COMPREHENSIVE METABOLIC PANEL
ALT: 16 U/L (ref 0–44)
AST: 19 U/L (ref 15–41)
Albumin: 3.9 g/dL (ref 3.5–5.0)
Alkaline Phosphatase: 145 U/L — ABNORMAL HIGH (ref 38–126)
Anion gap: 13 (ref 5–15)
BUN: 37 mg/dL — ABNORMAL HIGH (ref 6–20)
CO2: 24 mmol/L (ref 22–32)
Calcium: 9.2 mg/dL (ref 8.9–10.3)
Chloride: 101 mmol/L (ref 98–111)
Creatinine, Ser: 1.45 mg/dL — ABNORMAL HIGH (ref 0.44–1.00)
GFR calc Af Amer: 47 mL/min — ABNORMAL LOW (ref >60)
GFR calc non Af Amer: 40 mL/min — ABNORMAL LOW (ref >60)
Glucose, Bld: 144 mg/dL — ABNORMAL HIGH (ref 70–99)
Potassium: 4.2 mmol/L (ref 3.5–5.1)
Sodium: 138 mmol/L (ref 135–145)
Total Bilirubin: 0.4 mg/dL (ref 0.3–1.2)
Total Protein: 7.3 g/dL (ref 6.5–8.1)

## 2019-01-02 LAB — CBC WITH DIFFERENTIAL/PLATELET
Abs Immature Granulocytes: 0.06 10*3/uL (ref 0.00–0.07)
Basophils Absolute: 0.1 10*3/uL (ref 0.0–0.1)
Basophils Relative: 0 %
Eosinophils Absolute: 0.4 10*3/uL (ref 0.0–0.5)
Eosinophils Relative: 3 %
HCT: 40.8 % (ref 36.0–46.0)
Hemoglobin: 13.1 g/dL (ref 12.0–15.0)
Immature Granulocytes: 1 %
Lymphocytes Relative: 16 %
Lymphs Abs: 2.1 10*3/uL (ref 0.7–4.0)
MCH: 28.7 pg (ref 26.0–34.0)
MCHC: 32.1 g/dL (ref 30.0–36.0)
MCV: 89.3 fL (ref 80.0–100.0)
Monocytes Absolute: 1.1 10*3/uL — ABNORMAL HIGH (ref 0.1–1.0)
Monocytes Relative: 8 %
Neutro Abs: 9.6 10*3/uL — ABNORMAL HIGH (ref 1.7–7.7)
Neutrophils Relative %: 72 %
Platelets: 240 10*3/uL (ref 150–400)
RBC: 4.57 MIL/uL (ref 3.87–5.11)
RDW: 14.6 % (ref 11.5–15.5)
WBC: 13.3 10*3/uL — ABNORMAL HIGH (ref 4.0–10.5)
nRBC: 0 % (ref 0.0–0.2)

## 2019-01-02 NOTE — Telephone Encounter (Signed)
Patient FMLA paperwork signed by Dr. Fletcher Anon. Paperwork handed [name] to expedite to [employer].

## 2019-01-02 NOTE — Telephone Encounter (Signed)
Forms signed and sent back to [employer] via interoffice mail.  [place] for verne.  Faxing form as well to expedite processing.

## 2019-01-07 ENCOUNTER — Telehealth: Payer: Self-pay

## 2019-01-07 NOTE — Telephone Encounter (Signed)
-----   Message from Lequita Asal, MD sent at 01/02/2019 10:16 PM EST ----- Regarding: Please call patient  Alkaline phosphatase continues to fluctuate.  Any bone pain or RUQ pain?  Fractionate alkaline phosphatase in 2 weeks.  M ----- Message ----- From: Interface, Lab In [place] Sent: 01/02/2019   4:02 PM EST To: Lequita Asal, MD

## 2019-01-07 NOTE — Telephone Encounter (Signed)
Left a message To inform the patient of her labs results and to call back to the office to be schedule in 2 weeks for further labs.

## 2019-01-08 ENCOUNTER — Telehealth: Payer: Self-pay

## 2019-01-08 DIAGNOSIS — R748 Abnormal levels of other serum enzymes: Secondary | ICD-10-CM

## 2019-01-08 NOTE — Telephone Encounter (Signed)
Spoke with the patient with a return call back. I have informed her that her Alkalin Phosphatase keep going up and down and per Dr Mike Gip she would like to recheck levels in 2 weeks. The patient was schedule for 12/9 for labs and was understanding and agreeable.

## 2019-01-11 ENCOUNTER — Other Ambulatory Visit: Payer: Self-pay | Admitting: Hematology and Oncology

## 2019-01-11 DIAGNOSIS — I2782 Chronic pulmonary embolism: Secondary | ICD-10-CM

## 2019-01-14 ENCOUNTER — Ambulatory Visit: Payer: BC Managed Care – PPO | Admitting: Physician Assistant

## 2019-01-14 ENCOUNTER — Encounter: Payer: Self-pay | Admitting: Pulmonary Disease

## 2019-01-14 ENCOUNTER — Ambulatory Visit (INDEPENDENT_AMBULATORY_CARE_PROVIDER_SITE_OTHER): Payer: BC Managed Care – PPO | Admitting: Pulmonary Disease

## 2019-01-14 ENCOUNTER — Other Ambulatory Visit: Payer: Self-pay

## 2019-01-14 VITALS — BP 126/74 | HR 69 | Temp 98.2°F | Ht 64.0 in | Wt 340.0 lb

## 2019-01-14 DIAGNOSIS — R0683 Snoring: Secondary | ICD-10-CM | POA: Diagnosis not present

## 2019-01-14 NOTE — Patient Instructions (Signed)
Will arrange for home sleep study Will call to arrange for follow up after sleep study reviewed  

## 2019-01-14 NOTE — Progress Notes (Signed)
[facility]  Chief Complaint  Patient presents with  . sleep consult    per Christell Faith, PA- no prior sleep study- c/o daytime sleepiness and restless sleep.    Constitutional:  BP 126/74 (BP Location: Left Arm, Cuff Size: Normal)   Pulse 69   Temp 98.2 F (36.8 C) (Temporal)   Ht 5\' 4"  (1.626 m)   Wt (!) 340 lb (154.2 kg)   LMP 07/17/2012   SpO2 96%   BMI 58.36 kg/m   Past Medical History:  PNA, Diastolic CHF, A fib, CKD, HTN, Hypothyroidism, HLD, Breast cancer, DM, PE 2016, Polyneuropathy with ataxia  Brief Summary:  Nicole Schmidt is a 53 y.o. female with snoring.  She is followed by cardiology for A fib, hypertension and HFpEF.  She was noted to have snoring and sleep disruption.  Advised to have a sleep assessment.  She sleeps in a recliner.  Has trouble with her breathing if she lays flat.  Lives alone.  She will wake up hearing herself snore and feel her throat close.  She can get sleepy in the afternoon while watching TV.  She goes to sleep at 11 pm.  She falls asleep quickly.  She wakes up 1 or 2 times to use the bathroom.  She gets out of bed at 930 am.  She feels okay in the morning.  She denies morning headache.  She does not use anything to help her fall sleep or stay awake.  She denies sleep walking, sleep talking, bruxism, or nightmares.  There is no history of restless legs.  She denies sleep hallucinations, sleep paralysis, or cataplexy.  The Epworth score is 6 out of 24.    Physical Exam:   Appearance - well kempt   ENMT - clear nasal mucosa, midline nasal  septum, no oral exudates, no LAN, trachea midline, MP 4  Respiratory - normal chest wall, normal respiratory effort, no accessory muscle use, no wheeze/rales  CV - s1s2 regular rate and rhythm, no murmurs, no peripheral edema, radial pulses symmetric  GI - soft, non tender, no masses  Lymph - no adenopathy noted in neck and axillary areas  MSK - sitting  in wheelchair  Ext - no cyanosis, clubbing, or joint inflammation noted  Skin - no rashes, lesions, or ulcers  Neuro - normal strength, oriented x 3  Psych - normal mood and affect  Discussion:  She has snoring, sleep disruption, apnea and daytime sleepiness.  She has history of hypertension, A fib, and HFpEF.  She has family history of sleep apnea (her mother).  I am concerned she could have sleep apnea also.  Assessment/Plan:   Snoring with excessive daytime sleepiness. - will need to arrange for a home sleep study  Obesity. - discussed how weight can impact sleep and risk for sleep disordered breathing - discussed options to assist with weight loss: combination of diet modification, cardiovascular and strength training exercises  Cardiovascular risk. - had an extensive discussion regarding the adverse health consequences related to untreated sleep disordered breathing - specifically discussed the risks for hypertension, coronary artery disease, cardiac dysrhythmias, cerebrovascular disease, and diabetes - lifestyle modification discussed  Safe driving practices. - discussed how sleep disruption can increase risk of accidents, particularly when driving - safe driving practices were discussed  Therapies for obstructive sleep apnea. - if the sleep study shows significant sleep apnea, then various therapies for treatment were reviewed: CPAP, oral appliance, and surgical interventions  Patient Instructions  Will arrange for home sleep study Will call to arrange for follow up after sleep study reviewed     Chesley Mires, MD [place] Pager: [phone] 01/14/2019, 11:55 AM  Flow Sheet    Sleep tests:    Cardiac tests:  Echo 10/20/18 >> EF 60 to 65%, RVSP 50.7 mmHg  Review of Systems:  Reviewed and negative.  Medications:   Allergies as of 01/14/2019   No Known Allergies     Medication List       Accurate as of January 14, 2019  11:55 AM. If you have any questions, ask your nurse or doctor.        acetaminophen 500 MG tablet Commonly known as: TYLENOL Take 1,000 mg by mouth every 6 (six) hours as needed (for pain).   albuterol 108 (90 Base) MCG/ACT inhaler Commonly known as: VENTOLIN HFA Inhale 2 puffs into the lungs every 6 (six) hours as needed for wheezing or shortness of breath.   amLODipine 5 MG tablet Commonly known as: NORVASC Take 5 mg by mouth 2 (two) times daily.   atorvastatin 20 MG tablet Commonly known as: LIPITOR Take 20 mg by mouth at bedtime.   calcium carbonate 1500 (600 Ca) MG Tabs tablet Commonly known as: OSCAL Take 600 mg of elemental calcium by mouth daily.   CINNAMON PO Take 1,000 mg by mouth daily.   cloNIDine 0.2 MG tablet Commonly known as: CATAPRES Take 1 tablet (0.2 mg total) by mouth 2 (two) times daily.   Coricidin HBP 10-325-2 MG Tabs Generic drug: DM-APAP-CPM Take 1-2 tablets by mouth 3 (three) times daily as needed (cold symptoms).   Eliquis 5 MG Tabs tablet Generic drug: apixaban TAKE 1 TABLET TWICE A DAY   fluticasone-salmeterol 230-21 MCG/ACT inhaler Commonly known as: ADVAIR HFA Inhale 2 puffs into the lungs 2 (two) times daily.   folic acid 277 MCG tablet Commonly known as: FOLVITE Take 800 mcg by mouth daily.   furosemide 20 MG tablet Commonly known as: LASIX Take 1 tablet (20 mg) by mouth once daily   levothyroxine 112 MCG tablet Commonly known as: SYNTHROID Take 112 mcg daily before breakfast by mouth.   metoprolol tartrate 50 MG tablet Commonly known as: LOPRESSOR Take 1 tablet (50 mg total) by mouth 2 (two) times daily.   nystatin powder Generic drug: nystatin Apply 1 g topically 2 (two) times daily as needed (rash (skin folds)).   potassium chloride SA 20 MEQ tablet Commonly known as: KLOR-CON Take 1 tablet (20 mEq total) by mouth daily. What changed: when to take this   Trulicity 1.5 AJ/2.8NO Sopn Generic drug: Dulaglutide  Inject 1.5 mg into the skin every Monday. In the evening   vitamin C 500 MG tablet Commonly known as: ASCORBIC ACID Take 500 mg by mouth 2 (two) times daily.   Vitamin D3 1.25 MG (50000 UT) Tabs Take 50,000 Units by mouth every Friday.       Past Surgical History:  She  has a past surgical history that includes Breast surgery (Right, 2014); Colonoscopy with propofol (N/A, 01/30/2017); Breast biopsy (Right, 2014); Breast excisional biopsy (Right, 07/19/2012); and Cardioversion (N/A, 11/28/2018).  Family History:  Her family history includes Atrial fibrillation in her mother; Colon cancer (age of onset: 38) in her cousin; Diabetes in her father; Heart attack in her mother; Heart failure in her mother; Hypertension in her father and mother; Parkinson's disease in her father; Rheum arthritis in her mother.  Social History:  She  reports that she has never smoked. She has never used smokeless tobacco. She reports that she does not drink alcohol or use drugs.

## 2019-01-16 DIAGNOSIS — N179 Acute kidney failure, unspecified: Secondary | ICD-10-CM | POA: Insufficient documentation

## 2019-01-16 DIAGNOSIS — R600 Localized edema: Secondary | ICD-10-CM | POA: Insufficient documentation

## 2019-01-16 DIAGNOSIS — I10 Essential (primary) hypertension: Secondary | ICD-10-CM | POA: Insufficient documentation

## 2019-01-16 DIAGNOSIS — N183 Chronic kidney disease, stage 3 unspecified: Secondary | ICD-10-CM | POA: Insufficient documentation

## 2019-01-20 ENCOUNTER — Ambulatory Visit: Payer: BC Managed Care – PPO | Admitting: Physician Assistant

## 2019-01-21 ENCOUNTER — Telehealth: Payer: Self-pay | Admitting: Pulmonary Disease

## 2019-01-21 NOTE — Telephone Encounter (Signed)
Called pt & explained due to number of covid cases we had not been scheduling as many hst's are we have right many to schedule.  Explained to her it probably won't be by the end of the year but we will get to her as soon as we can.  She stated ok.  Nothing further needed.

## 2019-01-22 ENCOUNTER — Other Ambulatory Visit: Payer: Self-pay

## 2019-01-22 ENCOUNTER — Inpatient Hospital Stay: Payer: BC Managed Care – PPO | Attending: Hematology and Oncology

## 2019-01-22 DIAGNOSIS — R748 Abnormal levels of other serum enzymes: Secondary | ICD-10-CM | POA: Diagnosis present

## 2019-01-24 ENCOUNTER — Telehealth: Payer: Self-pay | Admitting: Cardiovascular Disease

## 2019-01-24 LAB — ALKALINE PHOSPHATASE, ISOENZYMES
Alk Phos Bone Fract: 43 % (ref 14–68)
Alk Phos Liver Fract: 40 % (ref 18–85)
Alk Phos: 180 IU/L — ABNORMAL HIGH (ref 39–117)
Intestinal %: 17 % (ref 0–18)

## 2019-01-24 NOTE — Telephone Encounter (Signed)
Recieved request from : [place] [facility]  Scanned to release  Forwarded to [employer] for processing

## 2019-01-26 NOTE — Progress Notes (Signed)
Patient ID: Nicole Schmidt, female    DOB: 09-24-1965, 53 y.o.   MRN: [id number]  HPI  Nicole Schmidt is a 53 y/o female with a history of breast cancer, DM, HTN, CKD, thyroid disease, anemia, gout, pulmonary embolus and chronic heart failure.   Echo report from 10/20/2018 reviewed and showed an EF of 60-65% along with an elevated PA pressure of 50.7 mmHg. Echo report from 11/20/17 reviewed and showed an EF of 55-60%.  Admitted 10/18/2018 due to pneumonia, acute on chronic HF and new-onset AF. Given antibiotics and steroids. Initially given IV lasix and then transitioned to oral diuretics. Discharged after 3 days.   She presents today for a follow-up visit with a chief complaint of moderate shortness of breath upon minimal exertion. She describes this as chronic in nature having been present for several months although she does feel like it's improving. She has associated fatigue, cough, pedal edema, anxiety and slight weight gain along with this. She denies any dizziness, abdominal distention, palpitations or chest pain.   Says that she's been working with PT 3 times / week and feels like her endurance is slowly improving.     Past Medical History:  Diagnosis Date  . (HFpEF) heart failure with preserved ejection fraction ([place])    a. 10/2018 Echo: EF 60-65%, diast dysfxn, RVSP 50.7 mmHg, mildly dil LA.  Marland Kitchen Anemia   . Arthritis   . Breast cancer ([place]) 2014   right breast cancer  . Breast cancer of upper-inner quadrant of right female breast ([place])    Right breast invasive CA and DCIS , 7 mm T1,N0,M0. Er/PR pos, her 2 negative.  Margins 1 mm.  . CKD (chronic kidney disease), stage III   . Diabetes mellitus without complication ([place])   . Essential hypertension   . Gout   . Hypothyroidism   . Menopause    age 46  . Morbid obesity ([place])   . Persistent atrial fibrillation ([place])    a. Dx 10/2018 in setting of PNA. CHA2DS2VASc = 4-->Eliquis; b. 11/2018 s/p successful DCCV.  Marland Kitchen Personal history of  radiation therapy   . Pulmonary embolism ([place]) 10/2014   a. Chronic eliquis.  . Thyroid goiter    Past Surgical History:  Procedure Laterality Date  . BREAST BIOPSY Right 2014   breast ca  . BREAST EXCISIONAL BIOPSY Right 07/19/2012   breast ca  . BREAST SURGERY Right 2014   with sentinel node bx subareaolar duct excision  . CARDIOVERSION N/A 11/28/2018   Procedure: CARDIOVERSION;  Surgeon: Minna Merritts, MD;  Location: [facility] ORS;  Service: Cardiovascular;  Laterality: N/A;  . COLONOSCOPY WITH PROPOFOL N/A 01/30/2017   Procedure: COLONOSCOPY WITH PROPOFOL;  Surgeon: Christene Lye, MD;  Location: [facility] ENDOSCOPY;  Service: Endoscopy;  Laterality: N/A;   Family History  Problem Relation Age of Onset  . Diabetes Father   . Hypertension Father   . Parkinson's disease Father   . Hypertension Mother   . Rheum arthritis Mother   . Atrial fibrillation Mother   . Heart failure Mother   . Heart attack Mother        died in her mid-70's.  . Colon cancer Cousin 66  . Breast cancer Neg Hx    Social History   Tobacco Use  . Smoking status: Never Smoker  . Smokeless tobacco: Never Used  Substance Use Topics  . Alcohol use: No    Alcohol/week: 0.0 standard drinks   No Known Allergies  Prior  to Admission medications   Medication Sig Start Date End Date Taking? Authorizing Provider  acetaminophen (TYLENOL) 500 MG tablet Take 1,000 mg by mouth every 6 (six) hours as needed (for pain).   Yes [provider]  albuterol (VENTOLIN HFA) 108 (90 Base) MCG/ACT inhaler Inhale 2 puffs into the lungs every 6 (six) hours as needed for wheezing or shortness of breath.   Yes [provider]  amLODipine (NORVASC) 5 MG tablet Take 5 mg by mouth 2 (two) times daily. 09/18/18  Yes [provider]  atorvastatin (LIPITOR) 20 MG tablet Take 20 mg by mouth at bedtime.    Yes [provider]  calcium carbonate (OSCAL) 1500 (600 Ca) MG TABS tablet Take 600 mg of  elemental calcium by mouth daily.    Yes [provider]  Cholecalciferol (VITAMIN D3) 50000 units TABS Take 50,000 Units by mouth every Friday.    Yes [provider]  CINNAMON PO Take 1,000 mg by mouth daily.    Yes [provider]  cloNIDine (CATAPRES) 0.2 MG tablet Take 1 tablet (0.2 mg total) by mouth 2 (two) times daily. 10/21/18  Yes Mayo, Pete Pelt, MD  DM-APAP-CPM (CORICIDIN HBP) 10-325-2 MG TABS Take 1-2 tablets by mouth 3 (three) times daily as needed (cold symptoms).   Yes [provider]  Dulaglutide (TRULICITY) 1.5 KP/5.4SF SOPN Inject 1.5 mg into the skin every Monday. In the evening   Yes [provider]  ELIQUIS 5 MG TABS tablet TAKE 1 TABLET TWICE A DAY 01/12/19  Yes Corcoran, Drue Second, MD  fluticasone-salmeterol (ADVAIR HFA) 230-21 MCG/ACT inhaler Inhale 2 puffs into the lungs 2 (two) times daily.   Yes [provider]  folic acid (FOLVITE) 681 MCG tablet Take 800 mcg by mouth daily.    Yes [provider]  furosemide (LASIX) 20 MG tablet Take 1 tablet (20 mg) by mouth once daily   Yes [provider]  levothyroxine (SYNTHROID, LEVOTHROID) 112 MCG tablet Take 112 mcg daily before breakfast by mouth.   Yes [provider]  losartan (COZAAR) 25 MG tablet Take 25 mg by mouth daily.   Yes [provider]  metoprolol tartrate (LOPRESSOR) 50 MG tablet Take 1 tablet (50 mg total) by mouth 2 (two) times daily. 11/21/18  Yes Wellington Hampshire, MD  nystatin (NYSTATIN) powder Apply 1 g topically 2 (two) times daily as needed (rash (skin folds)).   Yes [provider]  potassium chloride SA (KLOR-CON) 20 MEQ tablet Take 1 tablet (20 mEq total) by mouth daily. Patient taking differently: Take 20 mEq by mouth daily after breakfast.  11/12/18  Yes Darylene Price A, FNP  vitamin C (ASCORBIC ACID) 500 MG tablet Take 500 mg by mouth 2 (two) times daily.   Yes [provider]     Review of  Systems  Constitutional: Positive for fatigue. Negative for appetite change.  HENT: Negative for congestion, postnasal drip and sore throat.   Eyes: Negative.   Respiratory: Positive for cough and shortness of breath (with minimal exertion).   Cardiovascular: Positive for leg swelling ("little bit"). Negative for chest pain and palpitations.  Gastrointestinal: Negative for abdominal distention and abdominal pain.  Endocrine: Negative.   Genitourinary: Negative.   Musculoskeletal: Positive for arthralgias (feet at times). Negative for back pain.  Skin: Negative.   Allergic/Immunologic: Negative.   Neurological: Negative for dizziness, weakness and light-headedness.  Hematological: Negative for adenopathy. Bruises/bleeds easily.  Psychiatric/Behavioral: Positive for sleep disturbance. Negative for  dysphoric mood. The patient is nervous/anxious.    Vitals:   01/27/19 1302  BP: (!) 151/55  Pulse: 65  Resp: 18  SpO2: 94%  Weight: (!) 339 lb (153.8 kg)  Height: 5\' 4"  (1.626 m)   Wt Readings from Last 3 Encounters:  01/27/19 (!) 339 lb (153.8 kg)  01/14/19 (!) 340 lb (154.2 kg)  12/25/18 (!) 336 lb (152.4 kg)   Lab Results  Component Value Date   CREATININE 1.45 (H) 01/02/2019   CREATININE 1.76 (H) 12/25/2018   CREATININE 1.29 (H) 12/09/2018    Physical Exam Vitals and nursing note reviewed.  Constitutional:      Appearance: She is well-developed.  HENT:     Head: Normocephalic and atraumatic.  Cardiovascular:     Rate and Rhythm: Normal rate. Rhythm irregular.  Pulmonary:     Effort: Pulmonary effort is normal.     Breath sounds: Normal breath sounds. No wheezing or rales.  Abdominal:     Palpations: Abdomen is soft.     Tenderness: There is no abdominal tenderness.  Musculoskeletal:     Right lower leg: No tenderness. Edema (trace pitting) present.     Left lower leg: No tenderness. Edema (trace pitting) present.     Comments: (had lymph node removal 2014 in right  arm)  Skin:    General: Skin is warm and dry.  Neurological:     General: No focal deficit present.     Mental Status: She is alert and oriented to person, place, and time.  Psychiatric:        Mood and Affect: Mood normal.        Behavior: Behavior normal.   Assessment & Plan:  1: Chronic heart failure with preserved ejection fraction- - NYHA class III - euvolemic today - weighing daily; reminded to weigh every morning, write the weight down and call for an overnight weight gain of >2 pounds or a weekly weight gain of >5 pounds - weight up 5 pounds from last visit here 3 months ago - saw cardiology Sharolyn Douglas) 12/25/2018 - not adding salt and has been reading food labels; reviewed the importance of closely following a 2000mg  sodium diet  - BNP 10/30/2018 was 156.0 - saw pulmonology Halford Chessman) 01/14/2019 & is supposed to have home sleep study done in the future - received her flu vaccine for this season - continues with PT 3 times / week  2: HTN- - BP mildly elevated but she says that she's worried about a liver test that has been elevated  - BMP from 01/02/2019 reviewed and showed sodium 138, potassium 4.2, creatinine 1.45 and GFR 40 - follows with PCP [facility])  - saw nephrology Holley Raring) 01/20/2019 & losartan was started  3: Atrial fibrillation- - slightly irregular today - cardioverted October 2020  4: Lymphedema- - stage 2 - trying to elevate her legs when she's sitting for long periods of times - participating in PT right now - edema has improved - consider lymphapress compression boots if edema persists  Patient did not bring her medications nor a list. Each medication was verbally reviewed with the patient and she was encouraged to bring the bottles to every visit to confirm accuracy of list.  Return in 6 months or sooner for any questions/problems before then.

## 2019-01-27 ENCOUNTER — Encounter: Payer: Self-pay | Admitting: Family

## 2019-01-27 ENCOUNTER — Ambulatory Visit: Payer: BC Managed Care – PPO | Attending: Family | Admitting: Family

## 2019-01-27 ENCOUNTER — Other Ambulatory Visit: Payer: Self-pay

## 2019-01-27 VITALS — BP 151/55 | HR 65 | Resp 18 | Ht 64.0 in | Wt 339.0 lb

## 2019-01-27 DIAGNOSIS — I890 Lymphedema, not elsewhere classified: Secondary | ICD-10-CM | POA: Insufficient documentation

## 2019-01-27 DIAGNOSIS — I4819 Other persistent atrial fibrillation: Secondary | ICD-10-CM | POA: Diagnosis not present

## 2019-01-27 DIAGNOSIS — Z7901 Long term (current) use of anticoagulants: Secondary | ICD-10-CM | POA: Diagnosis not present

## 2019-01-27 DIAGNOSIS — E039 Hypothyroidism, unspecified: Secondary | ICD-10-CM | POA: Insufficient documentation

## 2019-01-27 DIAGNOSIS — I10 Essential (primary) hypertension: Secondary | ICD-10-CM

## 2019-01-27 DIAGNOSIS — Z923 Personal history of irradiation: Secondary | ICD-10-CM | POA: Diagnosis not present

## 2019-01-27 DIAGNOSIS — Z79899 Other long term (current) drug therapy: Secondary | ICD-10-CM | POA: Diagnosis not present

## 2019-01-27 DIAGNOSIS — I130 Hypertensive heart and chronic kidney disease with heart failure and stage 1 through stage 4 chronic kidney disease, or unspecified chronic kidney disease: Secondary | ICD-10-CM | POA: Diagnosis present

## 2019-01-27 DIAGNOSIS — Z853 Personal history of malignant neoplasm of breast: Secondary | ICD-10-CM | POA: Insufficient documentation

## 2019-01-27 DIAGNOSIS — M1990 Unspecified osteoarthritis, unspecified site: Secondary | ICD-10-CM | POA: Insufficient documentation

## 2019-01-27 DIAGNOSIS — Z8249 Family history of ischemic heart disease and other diseases of the circulatory system: Secondary | ICD-10-CM | POA: Insufficient documentation

## 2019-01-27 DIAGNOSIS — Z86711 Personal history of pulmonary embolism: Secondary | ICD-10-CM | POA: Insufficient documentation

## 2019-01-27 DIAGNOSIS — E1122 Type 2 diabetes mellitus with diabetic chronic kidney disease: Secondary | ICD-10-CM | POA: Diagnosis not present

## 2019-01-27 DIAGNOSIS — Z7984 Long term (current) use of oral hypoglycemic drugs: Secondary | ICD-10-CM | POA: Insufficient documentation

## 2019-01-27 DIAGNOSIS — Z7951 Long term (current) use of inhaled steroids: Secondary | ICD-10-CM | POA: Diagnosis not present

## 2019-01-27 DIAGNOSIS — N183 Chronic kidney disease, stage 3 unspecified: Secondary | ICD-10-CM | POA: Diagnosis not present

## 2019-01-27 DIAGNOSIS — I5032 Chronic diastolic (congestive) heart failure: Secondary | ICD-10-CM | POA: Insufficient documentation

## 2019-01-27 NOTE — Patient Instructions (Signed)
Continue weighing daily and call for an overnight weight gain of > 2 pounds or a weekly weight gain of >5 pounds. 

## 2019-01-31 ENCOUNTER — Telehealth: Payer: Self-pay | Admitting: Cardiovascular Disease

## 2019-01-31 NOTE — Telephone Encounter (Signed)
Spoke with patient and she reports that her physical therapy was extended and she wanted to know if she should reschedule appointment. She states the PT was for deconditioning and they extended her visits. Advised that she should keep her upcoming appointment next week regardless of those extra visits. She verbalized understanding and was agreeable with keeping appointment. Confirmed date, time, and location. She was appreciative for the information with no further questions at this time.

## 2019-01-31 NOTE — Progress Notes (Signed)
[facility]  [address], [address] [place], [place] [address] Phone: [phone]  Fax: [phone]   Telephone Office Visit:  02/06/2019  Referring physician: Lenard Simmer, MD  I connected with [name] on 02/06/19 at 11:15 AM by telephone and verified that I was speaking with the correct person using 2 identifiers.  The patient was in her car outside her home.  I discussed the limitations, risk, security and privacy concerns of performing an evaluation and management service by telephone and the availability of in person appointments.  I also discussed with the patient that there may be a patient responsible charge related to this service.  The patient expressed understanding and agreed to proceed.   Chief Complaint: Nicole Schmidt is a 53 y.o. female with stage I breast cancer, history of submassive pulmonary embolism, and iron deficiency anemia, who is seen for 6 month assessment   HPI: The patient was last seen in the medical oncology clinic on 08/30/2018. At that time,  she was doing well.  She denied any breast concerns.  She had a new nodule on her right arm.  Hematocrit was 40.9, hemoglobin 12.9, and MCV 93.6.  Ferritin was 115.  Creatinine was 1.38.  CA 27.29 was 29.8 (normal).  She was noted to have a nodule of her right forearm.  Ultrasound on 09/05/2018 revealed a 1.6 x 1.21  Isoechoic mass with surrounding edema in the superficial tissues.  Given the history of repetitive trauma to this area, sonographic findings were most suggestive of fat necrosis corresponding to the palpable abnormality in the proximal right forearm.  She was seen for CHF follow up with Lysle Morales on 09/27/2018. She had mild SOB with moderate exertion and some pedal edema.   She was admitted to [facility] from 10/18/2018 - 10/21/2018  with atrial fibrillation with rapid ventricular rate and community acquired pneumonia. Echo ejection fraction was 60-65%. She was treated  with ceftriaxone and azithromycin and transitioned to doxycycline 100 mg BID.  She received a 5 day course of prednisone. She continued Eliquis.  She was seen for follow up with Darylene Price, [place] on 10/30/2018. She had 3+ pitting edema up to knee. She was seen for follow up via telemedicine with Dr. Fletcher Anon on 11/01/2018.  Leg edema had improved after IV furosemide and Lasix was increased to 40 mg daily.  She was seen for persistent atrial fibrillation with Marquette Old on 11/20/2018. She underwent cardioversion on 11/28/2018 by Dr. Ida Rogue.  Bilateral screening mammogram on 11/20/2018 revealed no mammographic evidence of malignancy.   She was seen for f/u of stage 3 chronic kidney disease on 01/20/2019 by Dr. Holley Raring. She had stable lower extremity edema. She was to follow up in 4 months.  She was seen for follow up with Darylene Price, [place] on 01/27/2019. She was doing PT 3 times a week and felt her endurance was doing better. She has follow up in 6 months.  CBC followed: 10/17/2018: Hematocrit 44.9, hemoglobin 14.3, MCV 89.1, platelets 265,000, WBC 18800. 10/19/2018: Hematocrit 41.8, hemoglobin 12.9, MCV 91.7, platelets 221,000, WBC 14900. 10/20/2018: Hematocrit 43.5, hemoglobin 13.6, MCV 91.2, platelets 210,000, WBC 14700. 10/21/2018: Hematocrit 42.0, hemoglobin 13.3, MCV 90.1, platelets 199,000, WBC 13800. 11/20/2018: Hematocrit 41.0, hemoglobin 13.5, MCV 87.0, platelets 272,000, WBC 10600. 01/02/2019: Hematocrit 40.8, hemoglobin 13.1, MCV 89.3, platelets 240,000, WBC 13300 (ANC 9600).  01/16/2019: Hematocrit 38.2, hemoglobin 12.6, MCV 89.5, platelets 214,000, WBC 8100 ([place] 5662).   Alkaline phosphatase was 145 (38-126)  on 01/02/2019 and 180 on 01/22/2019 (43% bone, 40% liver and 17% intestinal).  During the interim, the nodule in her arm went away and she hardly feels it. She is still taking her Eloquis. Lower extremity edema is doing better, not as severe. She was taking 40 mg  Lasix but has reduced it back to 20 mg daily.  She started taking metoprolol 50 mg since her last visit.  Her physical therapy is doing well and is getting better. She does it to get ready to go back to work. Her last colonoscopy was done by Dr.Sankar.   Past Medical History:  Diagnosis Date  . (HFpEF) heart failure with preserved ejection fraction ([place])    a. 10/2018 Echo: EF 60-65%, diast dysfxn, RVSP 50.7 mmHg, mildly dil LA.  Marland Kitchen Anemia   . Arthritis   . Breast cancer ([place]) 2014   right breast cancer  . Breast cancer of upper-inner quadrant of right female breast ([place])    Right breast invasive CA and DCIS , 7 mm T1,N0,M0. Er/PR pos, her 2 negative.  Margins 1 mm.  . CHF (congestive heart failure) ([employer])   . CKD (chronic kidney disease), stage III   . Diabetes mellitus without complication ([place])   . Essential hypertension   . Gout   . Hypothyroidism   . Menopause    age 27  . Morbid obesity ([place])   . Persistent atrial fibrillation ([place])    a. Dx 10/2018 in setting of PNA. CHA2DS2VASc = 4-->Eliquis; b. 11/2018 s/p successful DCCV.  Marland Kitchen Personal history of radiation therapy   . Pulmonary embolism ([place]) 10/2014   a. Chronic eliquis.  . Thyroid goiter     Past Surgical History:  Procedure Laterality Date  . BREAST BIOPSY Right 2014   breast ca  . BREAST EXCISIONAL BIOPSY Right 07/19/2012   breast ca  . BREAST SURGERY Right 2014   with sentinel node bx subareaolar duct excision  . CARDIOVERSION N/A 11/28/2018   Procedure: CARDIOVERSION;  Surgeon: Minna Merritts, MD;  Location: [facility] ORS;  Service: Cardiovascular;  Laterality: N/A;  . COLONOSCOPY WITH PROPOFOL N/A 01/30/2017   Procedure: COLONOSCOPY WITH PROPOFOL;  Surgeon: Christene Lye, MD;  Location: [facility] ENDOSCOPY;  Service: Endoscopy;  Laterality: N/A;    Family History  Problem Relation Age of Onset  . Diabetes Father   . Hypertension Father   . Parkinson's disease Father   . Hypertension Mother   . Rheum  arthritis Mother   . Atrial fibrillation Mother   . Heart failure Mother   . Heart attack Mother        died in her mid-70's.  . Colon cancer Cousin 8  . Breast cancer Neg Hx     Social History:  reports that she has never smoked. She has never used smokeless tobacco. She reports that she does not drink alcohol or use drugs. Her mother died of a MI. Her father died in 06/04/15. He had Parkinson's disease and dementia. She is an only child. 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  Participants in the patient's visit and their role in the encounter included the patient, Samul Dada, scribe, and [employer], CMA, today.  The intake visit was provided by Samul Dada, scribe, and Vito Berger, CMA.   Allergies: No Known Allergies  Current Medications: Current Outpatient Medications  Medication Sig  Dispense Refill  . acetaminophen (TYLENOL) 500 MG tablet Take 1,000 mg by mouth every 6 (six) hours as needed (for pain).    Marland Kitchen albuterol (VENTOLIN HFA) 108 (90 Base) MCG/ACT inhaler Inhale 2 puffs into the lungs every 6 (six) hours as needed for wheezing or shortness of breath.    Marland Kitchen amLODipine (NORVASC) 5 MG tablet Take 5 mg by mouth 2 (two) times daily.    Marland Kitchen atorvastatin (LIPITOR) 20 MG tablet Take 20 mg by mouth at bedtime.     . calcium carbonate (OSCAL) 1500 (600 Ca) MG TABS tablet Take 600 mg of elemental calcium by mouth daily.     . Cholecalciferol (VITAMIN D3) 50000 units TABS Take 50,000 Units by mouth every Friday.     Marland Kitchen CINNAMON PO Take 1,000 mg by mouth daily.     . cloNIDine (CATAPRES) 0.2 MG tablet Take 1 tablet (0.2 mg total) by mouth 2 (two) times daily. 60 tablet 0  . DM-APAP-CPM (CORICIDIN HBP) 10-325-2 MG TABS Take 1-2 tablets by mouth 3 (three) times daily as needed (cold symptoms).    . Dulaglutide (TRULICITY) 1.5 NL/9.7QB SOPN Inject 1.5 mg into  the skin every Monday. In the evening    . ELIQUIS 5 MG TABS tablet TAKE 1 TABLET TWICE A DAY 180 tablet 3  . fluticasone-salmeterol (ADVAIR HFA) 230-21 MCG/ACT inhaler Inhale 2 puffs into the lungs 2 (two) times daily.    . folic acid (FOLVITE) 341 MCG tablet Take 800 mcg by mouth daily.     . furosemide (LASIX) 20 MG tablet Take 1 tablet (20 mg) by mouth once daily    . levothyroxine (SYNTHROID, LEVOTHROID) 112 MCG tablet Take 112 mcg daily before breakfast by mouth.    . losartan (COZAAR) 25 MG tablet Take 25 mg by mouth daily.    . metoprolol tartrate (LOPRESSOR) 50 MG tablet Take 1 tablet (50 mg total) by mouth 2 (two) times daily. 180 tablet 1  . potassium chloride SA (KLOR-CON) 20 MEQ tablet Take 1 tablet (20 mEq total) by mouth daily. (Patient taking differently: Take 20 mEq by mouth daily after breakfast. ) 90 tablet 3  . vitamin C (ASCORBIC ACID) 500 MG tablet Take 500 mg by mouth 2 (two) times daily.    Marland Kitchen nystatin (NYSTATIN) powder Apply 1 g topically 2 (two) times daily as needed (rash (skin folds)).     No current facility-administered medications for this visit.    Review of Systems  Constitutional: Negative for chills, diaphoresis, fever, malaise/fatigue and weight loss.       Feels "ok".  HENT: Negative.  Negative for congestion, ear pain, hearing loss, nosebleeds, sinus pain and sore throat.   Eyes: Negative.  Negative for blurred vision, double vision, photophobia and pain.  Respiratory: Negative.  Negative for cough, hemoptysis, sputum production and shortness of breath.   Cardiovascular: Positive for leg swelling ("some"). Negative for chest pain, palpitations, orthopnea and claudication.       Cardioversion 11/28/2018.  Gastrointestinal: Negative.  Negative for abdominal pain, blood in stool, constipation, diarrhea, melena, nausea and vomiting.       Normal colonoscopy on 01/30/2017.  Genitourinary: Negative.  Negative for dysuria, frequency, hematuria and urgency.    Musculoskeletal: Positive for joint pain (less at home). Negative for back pain, falls, myalgias and neck pain.       Right arm nodule, resolved.  Skin: Negative.  Negative for itching and rash.  Neurological: Negative.  Negative for dizziness, tingling, tremors, sensory change,  speech change, focal weakness, weakness and headaches.  Endo/Heme/Allergies: Does not bruise/bleed easily.       Diabetes and HYPOthyroidism.  Psychiatric/Behavioral: Negative.  Negative for depression and memory loss. The patient does not have insomnia.   All other systems reviewed and are negative.  Performance status (ECOG): 1 - Symptomatic but completely ambulatory  Vitals Last menstrual period 07/17/2012.   Physical Exam  Psychiatric: She has a normal mood and affect. Her behavior is normal. Judgment and thought content normal.  Nursing note reviewed.   No visits with results within 3 Day(s) from this visit.  Latest known visit with results is:  Appointment on 01/22/2019  Component Date Value Ref Range Status  . Alk Phos 01/22/2019 180* 39 - 117 IU/L Final  . Alk Phos Bone Fract 01/22/2019 43  14 - 68 % Final  . Alk Phos Liver Fract 01/22/2019 40  18 - 85 % Final  . Intestinal % 01/22/2019 17  0 - 18 % Final   Comment: (NOTE) Performed At: [facility] [address] [place], [place] [id number] Rush Farmer MD TL:[id number]     Assessment:  Nicole Schmidt is a 53 y.o. female with a history of stage I right breast cancerstatus post wide local incision on 08/12/2012. 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 Sentinel lymph node was negative. Tumor was ER/PR positive and Her2/neu negative. Oncotype DXscore was 14 (low risk).  She completedbreast radiationin 10/2012. She began tamoxifenin 10/2012, but discontinued it on 10/19/2014 following a submassive PE. She was on  [facility] 06/10/2015 - late 07/2018.   Bilateral diagnostic mammogramon  09/19/2019revealed no mammographic evidence for malignancy.  Bilateral screening mammogram on 11/20/2018 revealed no mammographic evidence of malignancy.   CA27.29has been followed: 21.7 on 04/24/2013, 21.4 on 07/31/2013, 17.9 on 03/09/2014, 20.7 on 01/18/2015, 17.2 on 06/10/2015, 22.5 on 08/13/2015, 16.6 on 11/08/2015, 19.8 on 01/31/2016, 25.2 on 05/29/2016, 19.3 on 11/30/2016, 22.1 on 05/14/2017, and 29.8 on 08/30/2018.  She had a bilateral submassive pulmonary emboli on 10/18/2014. Chest CT angiogram revealed submassive PE with right heart strain. She received catheter directed lytic therapy at [facility]. Lower extremity duplex was negative for DVT. She was on Xarelto (discontinued secondary to oral bleeding). She began Eliquison 07/08/2015.  Hypercoagulable work-uprevealed the following normal studies: Factor V Leiden, prothrombin gene mutation, lupus anticoagulant panel, anticardiolipin antibodies, protein S total (127%), protein S activity (99%), protein C total (71%), protein C activity (77%), and ATIII activity (82%).  Bone density studyon 01/26/2015 was normalwith a T score of -0.3. She has had no menses since 07/2012. Labs on 01/18/2015 confirmed a post-menopausalstatus. She began [facility] 06/10/2015.   BCItesting indicate a 3% (95% CI: 0.8-5.0%) risk of late recurrence, thus a low likelihood of benefit for extended (5 versus 10 years) adjuvant hormonal therapy.   She was noted to have a normocytic anemiaon 06/10/2015. Labs on 07/08/2015 revealed documented iron deficiency. Ferritin was 25, iron saturation 6%, TIBC 473 (high). Normal labs included B12, folate, SPEP revealed, LFTs, and guaiac cards x 3. She is on oral ironwith vitamin C.  Colonoscopy on 01/30/2017 was normal.  Ferritin has been followed: 25 on 07/08/2015, 30 on 11/08/2015, 46 on 01/31/2016, 37 on 05/29/2016, 53 on 05/14/2017, 92 on 11/14/2017, and 115 on 08/30/2018.   She was noted to  havestage III renal insufficiencyon 06/10/2015. Creatinine was 1.34 (CrCl 45 ml/min) with prior baseline Cr of 0.86 - 0.93. She was seen by nephrology. Etiology was felt secondary to dehydration.  Echocardiogramon 11/20/2017 demonstrated an LVEF of 55-60%. Bone densityon 11/29/2017 that revealed a normal T-score of -1.0 in the LEFT femoral neck.   [facility] (creatinine 4.54) secondary to volume depletion, as patient was on both a loop and thiazide diuretic. Patient underwent gentle IV rehydration. Renal function deteriorated to aBUN 62 and creatine 6.3. She underwent dialysis on 11/28/2017. Discharge labs revealed a BUN of 46 and creatinine of 2.62.  She was admitted to [place] 03/29/2018 - 04/03/2018.She received Tamiflu for influenza A. She remained hospitalized secondary tohypoxia. She did not go home on oxygen.  Symptomatically, she feels "ok".  She denies any breast concerns.  Plan: 1.   Review labs from 02/03/2019. 2.   Stage I RIGHT breast cancer Clinically, she is doing well.   She denies any breast concerns. BCI testing revealed a low risk of late recurrence.   She completed 5 years of endocrine therapy in late 07/2018.   Bilateral screening mammogram on 11/20/2018 revealed no mammographic evidence of malignancy. Continue to monitor. 3.Renal insufficiency Creatinine 1.45 on 01/02/2019.  Continue to monitor. 4.Elevated alkaline phosphatase, fluctuating.             Etiology remains unclear.               Alkaline phosphatase was 109 (38-126) on 03/29/2018.              Alkaline phosphatase was 188 (36% bone, 39% liver and 25% intestinal).   Without intestinal component alkaline phosphatase 141.             GI consult. 5.Iron deficiency anemia Hematocrit 40.8.  Hemoglobin13.1.  MCV89.3. Ferritin115 on 08/30/2018. Patient off oral iron. Continue to monitor. 6.History of BILATERAL pulmonary  emboli Patient remains on chronic anticoagulation. Continue Eliquis. 7.Right forearm nodule, resolved             Review interval ultrasound.  Etiology felt secondary to fat necrosis.  Per patient, nodule resolved. 8.   RTC in 2 months for MD assessment and labs (CBC with diff, CMP, CA27.29).  I discussed the assessment and treatment plan with the patient.  The patient was provided an opportunity to ask questions and all were answered.  The patient agreed with the plan and demonstrated an understanding of the instructions.  The patient was advised to call back if the symptoms worsen or if the condition fails to improve as anticipated.  I provided 15 minutes (11:15 AM - 11:30 AM) of non face-to-face time during this this encounter and > 50% was spent counseling as documented under my assessment and plan.    Lequita Asal, MD, PhD    02/06/2019, 11:30 AM  I, Samul Dada, am acting as a scribe for Lequita Asal, MD.  I, [place] Mike Gip, MD, have reviewed the above documentation for accuracy and completeness, and I agree with the above.

## 2019-01-31 NOTE — Telephone Encounter (Signed)
Patient calling States that physical therapy wants to extend for 2 more weeks Would like to know if she should just cancel her follow up 12/22 and r/s until after therapy is completed Transferred to [facility]

## 2019-02-03 ENCOUNTER — Inpatient Hospital Stay: Payer: BC Managed Care – PPO

## 2019-02-03 ENCOUNTER — Other Ambulatory Visit: Payer: Self-pay

## 2019-02-03 DIAGNOSIS — R748 Abnormal levels of other serum enzymes: Secondary | ICD-10-CM

## 2019-02-04 ENCOUNTER — Ambulatory Visit (INDEPENDENT_AMBULATORY_CARE_PROVIDER_SITE_OTHER): Payer: BC Managed Care – PPO | Admitting: Nurse Practitioner

## 2019-02-04 ENCOUNTER — Encounter: Payer: Self-pay | Admitting: Nurse Practitioner

## 2019-02-04 VITALS — BP 120/70 | HR 62 | Ht 64.0 in | Wt 345.0 lb

## 2019-02-04 DIAGNOSIS — I5032 Chronic diastolic (congestive) heart failure: Secondary | ICD-10-CM

## 2019-02-04 DIAGNOSIS — E119 Type 2 diabetes mellitus without complications: Secondary | ICD-10-CM

## 2019-02-04 DIAGNOSIS — Z794 Long term (current) use of insulin: Secondary | ICD-10-CM

## 2019-02-04 DIAGNOSIS — I10 Essential (primary) hypertension: Secondary | ICD-10-CM

## 2019-02-04 DIAGNOSIS — N183 Chronic kidney disease, stage 3 unspecified: Secondary | ICD-10-CM

## 2019-02-04 DIAGNOSIS — R5381 Other malaise: Secondary | ICD-10-CM

## 2019-02-04 NOTE — Patient Instructions (Signed)
Medication Instructions:  Your physician recommends that you continue on your current medications as directed. Please refer to the Current Medication list given to you today.  *If you need a refill on your cardiac medications before your next appointment, please call your pharmacy*  Lab Work: None ordered  If you have labs (blood work) drawn today and your tests are completely normal, you will receive your results only by: Marland Kitchen MyChart Message (if you have MyChart) OR . A paper copy in the mail If you have any lab test that is abnormal or we need to change your treatment, we will call you to review the results.  Testing/Procedures: None ordered   Follow-Up: At [facility], you and your health needs are our priority.  As part of our continuing mission to provide you with exceptional heart care, we have created designated Provider Care Teams.  These Care Teams include your primary Cardiologist (physician) and Advanced Practice Providers (APPs -  Physician Assistants and Nurse Practitioners) who all work together to provide you with the care you need, when you need it.  Your next appointment:   3 month(s)  The format for your next appointment:   In Person  Provider:    You may see Kathlyn Sacramento, MD or Murray Hodgkins, NP.

## 2019-02-04 NOTE — Progress Notes (Signed)
Office Visit    Patient Name: Nicole Schmidt Date of Encounter: 02/04/2019  Primary Care Provider:  Lenard Simmer, MD Primary Cardiologist:  Kathlyn Sacramento, MD  Chief Complaint    53 year old female with a history of breast cancer status post radiation, persistent A. fib status post cardioversion in October 2020, hypertension, type 2 diabetes mellitus, stage III chronic kidney disease, anemia, obesity, hypothyroidism, PE (10/2014) on chronic Eliquis, and HFpEF, who presents for follow-up of heart failure and A. fib.  Past Medical History    Past Medical History:  Diagnosis Date  . (HFpEF) heart failure with preserved ejection fraction ([place])    a. 10/2018 Echo: EF 60-65%, diast dysfxn, RVSP 50.7 mmHg, mildly dil LA.  Marland Kitchen Anemia   . Arthritis   . Breast cancer ([place]) 2014   right breast cancer  . Breast cancer of upper-inner quadrant of right female breast ([place])    Right breast invasive CA and DCIS , 7 mm T1,N0,M0. Er/PR pos, her 2 negative.  Margins 1 mm.  . CHF (congestive heart failure) ([place])   . CKD (chronic kidney disease), stage III   . Diabetes mellitus without complication ([place])   . Essential hypertension   . Gout   . Hypothyroidism   . Menopause    age 33  . Morbid obesity ([place])   . Persistent atrial fibrillation ([place])    a. Dx 10/2018 in setting of PNA. CHA2DS2VASc = 4-->Eliquis; b. 11/2018 s/p successful DCCV.  Marland Kitchen Personal history of radiation therapy   . Pulmonary embolism ([place]) 10/2014   a. Chronic eliquis.  . Thyroid goiter    Past Surgical History:  Procedure Laterality Date  . BREAST BIOPSY Right 2014   breast ca  . BREAST EXCISIONAL BIOPSY Right 07/19/2012   breast ca  . BREAST SURGERY Right 2014   with sentinel node bx subareaolar duct excision  . CARDIOVERSION N/A 11/28/2018   Procedure: CARDIOVERSION;  Surgeon: Minna Merritts, MD;  Location: [facility] ORS;  Service: Cardiovascular;  Laterality: N/A;  . COLONOSCOPY WITH PROPOFOL N/A 01/30/2017   Procedure: COLONOSCOPY WITH PROPOFOL;  Surgeon: Christene Lye, MD;  Location: [facility] ENDOSCOPY;  Service: Endoscopy;  Laterality: N/A;    Allergies  No Known Allergies  History of Present Illness    53 year old female with a history of breast cancer status post radiation, hypertension, type 2 diabetes mellitus, stage III chronic kidney disease, anemia, obesity, hypothyroidism, PE (September 2016) on chronic Eliquis, HFpEF, and A. fib status post cardioversion November 28, 2018.  She was admitted to [facility] in early September with dyspnea and found to be in atrial fibrillation with rapid ventricular response at rate of 130.  Echo showed normal EF with diastolic dysfunction and RVSP of 50.7 mmHg.  She required IV diuresis and rate control was achieved with oral beta-blocker.  Following discharge, she struggled with malaise, fatigue, and dyspnea.  She underwent successful DCCV on November 28, 2018.  She continued to note dyspnea and volume excess with 10 pound weight gain noted on follow-up October 26 requiring escalation of Lasix dosing.  Despite this, she continued to report significant dyspnea on exertion at follow-up November 11.  It was felt that deconditioning was playing a major role in her ongoing symptoms and she was referred to physical therapy.  She was also referred for sleep study.  With PT, she has been noting slow and steady improvement in activity tolerance.  Just yesterday, she was able to complete a 2-minute exercise requiring  stepping in toetapping.  She is sure she would not of been able to do that a month ago.  Her dyspnea has improved though is not quite back to baseline.  She denies chest pain, palpitations, PND, orthopnea, dizziness, syncope, or early satiety.  She continues have mild ankle edema.  Home Medications    Prior to Admission medications   Medication Sig Start Date End Date Taking? Authorizing Provider  acetaminophen (TYLENOL) 500 MG tablet Take 1,000 mg by mouth  every 6 (six) hours as needed (for pain).    [provider]  albuterol (VENTOLIN HFA) 108 (90 Base) MCG/ACT inhaler Inhale 2 puffs into the lungs every 6 (six) hours as needed for wheezing or shortness of breath.    [provider]  amLODipine (NORVASC) 5 MG tablet Take 5 mg by mouth 2 (two) times daily. 09/18/18   [provider]  atorvastatin (LIPITOR) 20 MG tablet Take 20 mg by mouth at bedtime.     [provider]  calcium carbonate (OSCAL) 1500 (600 Ca) MG TABS tablet Take 600 mg of elemental calcium by mouth daily.     [provider]  Cholecalciferol (VITAMIN D3) 50000 units TABS Take 50,000 Units by mouth every Friday.     [provider]  CINNAMON PO Take 1,000 mg by mouth daily.     [provider]  cloNIDine (CATAPRES) 0.2 MG tablet Take 1 tablet (0.2 mg total) by mouth 2 (two) times daily. 10/21/18   Mayo, Pete Pelt, MD  DM-APAP-CPM (CORICIDIN HBP) 10-325-2 MG TABS Take 1-2 tablets by mouth 3 (three) times daily as needed (cold symptoms).    [provider]  Dulaglutide (TRULICITY) 1.5 WJ/1.9JY SOPN Inject 1.5 mg into the skin every Monday. In the evening    [provider]  ELIQUIS 5 MG TABS tablet TAKE 1 TABLET TWICE A DAY 01/12/19   Lequita Asal, MD  fluticasone-salmeterol (ADVAIR HFA) 230-21 MCG/ACT inhaler Inhale 2 puffs into the lungs 2 (two) times daily.    [provider]  folic acid (FOLVITE) 782 MCG tablet Take 800 mcg by mouth daily.     [provider]  furosemide (LASIX) 20 MG tablet Take 1 tablet (20 mg) by mouth once daily    [provider]  levothyroxine (SYNTHROID, LEVOTHROID) 112 MCG tablet Take 112 mcg daily before breakfast by mouth.    [provider]  losartan (COZAAR) 25 MG tablet Take 25 mg by mouth daily.    [provider]  metoprolol tartrate (LOPRESSOR) 50 MG tablet Take 1 tablet (50 mg total) by mouth 2 (two) times daily. 11/21/18    Wellington Hampshire, MD  nystatin (NYSTATIN) powder Apply 1 g topically 2 (two) times daily as needed (rash (skin folds)).    [provider]  potassium chloride SA (KLOR-CON) 20 MEQ tablet Take 1 tablet (20 mEq total) by mouth daily. Patient taking differently: Take 20 mEq by mouth daily after breakfast.  11/12/18   Alisa Graff, FNP  vitamin C (ASCORBIC ACID) 500 MG tablet Take 500 mg by mouth 2 (two) times daily.    [provider]    Review of Systems    Ongoing dyspnea on exertion though slowly improving with physical therapy.  She continues to have mild ankle edema just above the sock line.  She denies chest pain, palpitations, PND, orthopnea, dizziness, syncope, or early satiety.  All other systems reviewed and are otherwise negative except as noted above.  Physical  Exam    VS:  BP 120/70 (BP Location: Left Arm, Patient Position: Sitting, Cuff Size: Large)   Pulse 62   Ht 5\' 4"  (1.626 m)   Wt (!) 345 lb (156.5 kg)   LMP 07/17/2012   SpO2 97%   BMI 59.22 kg/m  , BMI Body mass index is 59.22 kg/m. GEN: Obese, in no acute distress. HEENT: normal. Neck: Supple, obese, difficult to gauge JVP.  No carotid bruits, or masses. Cardiac: RRR, no murmurs, rubs, or gallops. No clubbing, cyanosis, trace to 1+ bilateral ankle edema.  Radials/PT 2+ and equal bilaterally.  Respiratory:  Respirations regular and unlabored, clear to auscultation bilaterally. GI: Obese, soft, nontender, nondistended, BS + x 4. MS: no deformity or atrophy. Skin: warm and dry, no rash. Neuro:  Strength and sensation are intact. Psych: Normal affect.  Accessory Clinical Findings    ECG personally reviewed by me today -regular sinus rhythm, 62, baseline artifact- no acute changes.  Lab Results  Component Value Date   WBC 13.3 (H) 01/02/2019   HGB 13.1 01/02/2019   HCT 40.8 01/02/2019   MCV 89.3 01/02/2019   PLT 240 01/02/2019   Lab Results  Component Value Date   CREATININE 1.45 (H)  01/02/2019   BUN 37 (H) 01/02/2019   NA 138 01/02/2019   K 4.2 01/02/2019   CL 101 01/02/2019   CO2 24 01/02/2019   Lab Results  Component Value Date   ALT 16 01/02/2019   AST 19 01/02/2019   ALKPHOS 145 (H) 01/02/2019   BILITOT 0.4 01/02/2019    Lab Results  Component Value Date   HGBA1C 7.1 (H) 10/18/2018    Assessment & Plan    1.  Persistent atrial fibrillation: Status post cardioversion October 15 and maintaining sinus rhythm.  With physical therapy, she has noted improvement in activity tolerance though she is not quite back to baseline.  She remains on beta-blocker and Eliquis therapy in the setting of a CHA2DS2VASc of 4.  2.  HFpEF/pulmonary hypertension: Weight is up 6 pounds on our scale though she notes stability on her home scale.  She has mild ankle edema though otw appears euvolemic (difficult to assess 2/2 body habitus).  Activity tolerance has been improving in the setting of physical therapy and she notes stable oxygen saturations during PT as well.  Heart rate and blood pressure stable.  She remains on Lasix 20 mg daily in addition to ARB, beta-blocker, and calcium channel blocker.    3.  Morbid obesity/severe physical deconditioning: As noted, patient's weight is up 6 pounds on our scale though volume assessment has not changed.  She is severely deconditioned and currently working with physical therapy.  She notes great improvements though she still has a way to go and PT plans to extend therapy for another 4 weeks.  I think this is appropriate and at this point, based on her description of her job, it does not appear that she would be able to do it at least until she is cleared by PT.  4.  Essential hypertension: Stable.   5.  History of PE: On chronic Eliquis.  6.  Sleep disordered breathing: She has been seen by pulmonology with plan for sleep study.  7.  Stage III chronic kidney disease: Renal function was stable in November.  8.  Insulin-dependent diabetes  mellitus: A1c 7.1 in September.  Insulin management per primary care.  9.  Disposition: Follow-up in 3 months or sooner if necessary.  Murray Hodgkins,  NP 02/04/2019, 9:43 AM

## 2019-02-06 ENCOUNTER — Inpatient Hospital Stay (HOSPITAL_BASED_OUTPATIENT_CLINIC_OR_DEPARTMENT_OTHER): Payer: BC Managed Care – PPO | Admitting: Hematology and Oncology

## 2019-02-06 ENCOUNTER — Ambulatory Visit: Payer: BC Managed Care – PPO

## 2019-02-06 ENCOUNTER — Other Ambulatory Visit: Payer: BC Managed Care – PPO

## 2019-02-06 DIAGNOSIS — Z170 Estrogen receptor positive status [ER+]: Secondary | ICD-10-CM

## 2019-02-06 DIAGNOSIS — E785 Hyperlipidemia, unspecified: Secondary | ICD-10-CM

## 2019-02-06 DIAGNOSIS — N183 Chronic kidney disease, stage 3 unspecified: Secondary | ICD-10-CM

## 2019-02-06 DIAGNOSIS — D509 Iron deficiency anemia, unspecified: Secondary | ICD-10-CM | POA: Diagnosis not present

## 2019-02-06 DIAGNOSIS — Z923 Personal history of irradiation: Secondary | ICD-10-CM

## 2019-02-06 DIAGNOSIS — C50911 Malignant neoplasm of unspecified site of right female breast: Secondary | ICD-10-CM

## 2019-02-06 DIAGNOSIS — I129 Hypertensive chronic kidney disease with stage 1 through stage 4 chronic kidney disease, or unspecified chronic kidney disease: Secondary | ICD-10-CM

## 2019-02-06 DIAGNOSIS — I4819 Other persistent atrial fibrillation: Secondary | ICD-10-CM

## 2019-02-06 DIAGNOSIS — Z7901 Long term (current) use of anticoagulants: Secondary | ICD-10-CM

## 2019-02-06 DIAGNOSIS — I2782 Chronic pulmonary embolism: Secondary | ICD-10-CM

## 2019-02-06 DIAGNOSIS — R748 Abnormal levels of other serum enzymes: Secondary | ICD-10-CM | POA: Diagnosis not present

## 2019-02-06 DIAGNOSIS — Z86711 Personal history of pulmonary embolism: Secondary | ICD-10-CM

## 2019-02-06 DIAGNOSIS — R600 Localized edema: Secondary | ICD-10-CM

## 2019-02-06 DIAGNOSIS — Z794 Long term (current) use of insulin: Secondary | ICD-10-CM

## 2019-02-06 DIAGNOSIS — E119 Type 2 diabetes mellitus without complications: Secondary | ICD-10-CM

## 2019-02-06 DIAGNOSIS — E039 Hypothyroidism, unspecified: Secondary | ICD-10-CM

## 2019-02-07 LAB — ALKALINE PHOSPHATASE, ISOENZYMES
Alk Phos Bone Fract: 36 % (ref 14–68)
Alk Phos Liver Fract: 39 % (ref 18–85)
Alk Phos: 188 IU/L — ABNORMAL HIGH (ref 39–117)
Intestinal %: 25 % — ABNORMAL HIGH (ref 0–18)

## 2019-02-10 ENCOUNTER — Telehealth: Payer: Self-pay | Admitting: Pulmonary Disease

## 2019-02-13 NOTE — Telephone Encounter (Signed)
Placed in nurse box.

## 2019-02-17 NOTE — Telephone Encounter (Signed)
Nicole Schmidt, please advise on this for pt. Thanks!

## 2019-02-19 NOTE — Telephone Encounter (Signed)
Liz please advise. Thanks  

## 2019-02-26 NOTE — Telephone Encounter (Signed)
Sent liz a message, she will take a look at this today 02/26/19

## 2019-02-26 NOTE — Telephone Encounter (Signed)
Patient has an out of Nutritional therapist.  Our providers are credentailed through [facility], who processes the out of state claims as well as the in state claims.  As long as Dr .Halford Chessman is credentialed with [facility], that should cover all out of state plans.  Called the patient, explained this to her.

## 2019-02-27 ENCOUNTER — Other Ambulatory Visit: Payer: BC Managed Care – PPO

## 2019-02-27 ENCOUNTER — Ambulatory Visit: Payer: BC Managed Care – PPO | Admitting: Hematology and Oncology

## 2019-03-25 ENCOUNTER — Telehealth: Payer: Self-pay

## 2019-03-25 NOTE — Telephone Encounter (Signed)
Attempted to reach patient to discuss above and to confirm if she would like to pick up letter or have it faxed.   No ans no vm

## 2019-03-25 NOTE — Telephone Encounter (Signed)
Patient calling back and is aware that the form will not be changed and is frustrated.  Patient will not be getting paid now and states "thanks Dr. Fletcher Anon " she states she does not need the letter because she doesn't have anyone to give it to.   Patient also wants to know how frequent BNP needs to be drawn as it is expensive and pcp is ordering every 3 months .

## 2019-03-25 NOTE — Telephone Encounter (Addendum)
Request received from [employer] to amend the patients return to work date from 03/10/2019 to 02/04/2019 on the patients previously completed FMLA paper work.  Discussed with my Engineer, building services. We will not amend the previously completed form. We can provide a letter stating that we have been notified by the patient that she returned to work sooner than advised (listing the date provided by the patient), and confirmation of the return to work date should be confirmed with the patient directly.   Letter has been drafted and placed in Dr. Tyrell Antonio in box to sign.

## 2019-03-25 NOTE — Telephone Encounter (Signed)
DPR on file with an ok to leave a detailed message on her voicemail.  [email or web address].Patient needs to talk with the ordering provider her pcp to discuss the medical necessity or justification for having a BNP drawn every 3 months.

## 2019-04-09 ENCOUNTER — Other Ambulatory Visit: Payer: BC Managed Care – PPO

## 2019-04-09 ENCOUNTER — Ambulatory Visit: Payer: BC Managed Care – PPO | Admitting: Hematology and Oncology

## 2019-04-10 ENCOUNTER — Ambulatory Visit (INDEPENDENT_AMBULATORY_CARE_PROVIDER_SITE_OTHER): Payer: BLUE CROSS/BLUE SHIELD | Admitting: Gastroenterology

## 2019-04-10 ENCOUNTER — Encounter: Payer: Self-pay | Admitting: Gastroenterology

## 2019-04-10 ENCOUNTER — Other Ambulatory Visit: Payer: Self-pay

## 2019-04-10 VITALS — BP 175/69 | HR 80 | Temp 98.2°F

## 2019-04-10 DIAGNOSIS — R748 Abnormal levels of other serum enzymes: Secondary | ICD-10-CM | POA: Diagnosis not present

## 2019-04-10 NOTE — Progress Notes (Signed)
Gastroenterology Consultation  Referring Provider:     Lenard Simmer, MD Primary Care Physician:  Lenard Simmer, MD Primary Gastroenterologist:  Dr. Allen Norris     Reason for Consultation:     Abnormal alkaline phosphatase        HPI:   Nicole Schmidt is a 54 y.o. y/o female referred for consultation & management of abnormal alkaline phosphatase by Dr. Ronnald Collum, Lourdes Sledge, MD.  This patient comes to me after being found to have abnormal alkaline phosphatase dating back to 2016.  The fractionation has shown the increase to be caused by intestinal alkaline phosphatase and not liver or bone.  The most recent labs with fractionation have shown:  Component     Latest Ref Rng & Units 06/13/2017 11/15/2017 03/29/2018 08/30/2018        10:33 AM     Alkaline Phosphatase     39 - 117 IU/L 176 (H) 115 109 156 (H)  Alk Phos Bone Fract     14 - 68 % 32     Alk Phos Liver Fract     18 - 85 % 33     Intestinal %     0 - 18 % 35 (H)      Component     Latest Ref Rng & Units 01/02/2019 01/22/2019 02/03/2019            Alkaline Phosphatase     39 - 117 IU/L 145 (H) 180 (H) 188 (H)  Alk Phos Bone Fract     14 - 68 %  43 36  Alk Phos Liver Fract     18 - 85 %  40 39  Intestinal %     0 - 18 %  17 25 (H)   The laboratory comments in regards to elevated intestinal fraction states:  Intestinal alkaline phosphatase is seen normally in the serum of subjects who have B or O blood types, especially after a fatty meal. Pathologically the band may be present in perforation of the bowel, ulcerative disease of the intestine and faintly in liver cirrhosis. Acute infarction of the intestine will cause a release of intestinal ALP from the mucosa. Large erosive or ulcerative lesions of the stomach, duodenum or other small intestinal areas, or colon may result in an elevation of the serum ALP level. The small intestinal lesions associated with malabsorption are associated with an elevation of the serum  intestinal ALP level only if there is an erosive or ulcerative mucosal lesion.   The patient denies any GI symptoms whatsoever.  As seen above on the previous labs the patient will go from elevated alkaline phosphatase to normal alkaline phosphatase.  She has no unexplained weight loss fevers chills nausea vomiting black stools or bloody stools.  She also has no signs of inflammatory bowel disease.  She does report that she is blood type O which has been associated as seen above with increased intestinal alkaline phosphatase. The patient does have some past diagnosis of irritable bowel syndrome and states that sometimes she can eat a food and it will cause her to run to the bathroom immediately and other times she will eat the same food without those symptoms.  Past Medical History:  Diagnosis Date  . (HFpEF) heart failure with preserved ejection fraction ([place])    a. 10/2018 Echo: EF 60-65%, diast dysfxn, RVSP 50.7 mmHg, mildly dil LA.  Marland Kitchen Anemia   . Arthritis   . Breast cancer ([place]) 2014  right breast cancer  . Breast cancer of upper-inner quadrant of right female breast ([place])    Right breast invasive CA and DCIS , 7 mm T1,N0,M0. Er/PR pos, her 2 negative.  Margins 1 mm.  . CHF (congestive heart failure) ([place])   . CKD (chronic kidney disease), stage III   . Diabetes mellitus without complication ([place])   . Essential hypertension   . Gout   . Hypothyroidism   . Menopause    age 32  . Morbid obesity ([place])   . Persistent atrial fibrillation ([place])    a. Dx 10/2018 in setting of PNA. CHA2DS2VASc = 4-->Eliquis; b. 11/2018 s/p successful DCCV.  Marland Kitchen Personal history of radiation therapy   . Pulmonary embolism ([place]) 10/2014   a. Chronic eliquis.  . Thyroid goiter     Past Surgical History:  Procedure Laterality Date  . BREAST BIOPSY Right 2014   breast ca  . BREAST EXCISIONAL BIOPSY Right 07/19/2012   breast ca  . BREAST SURGERY Right 2014   with sentinel node bx subareaolar duct excision    . CARDIOVERSION N/A 11/28/2018   Procedure: CARDIOVERSION;  Surgeon: Minna Merritts, MD;  Location: [facility] ORS;  Service: Cardiovascular;  Laterality: N/A;  . COLONOSCOPY WITH PROPOFOL N/A 01/30/2017   Procedure: COLONOSCOPY WITH PROPOFOL;  Surgeon: Christene Lye, MD;  Location: [facility] ENDOSCOPY;  Service: Endoscopy;  Laterality: N/A;    Prior to Admission medications   Medication Sig Start Date End Date Taking? Authorizing Provider  acetaminophen (TYLENOL) 500 MG tablet Take 1,000 mg by mouth every 6 (six) hours as needed (for pain).    [provider]  albuterol (VENTOLIN HFA) 108 (90 Base) MCG/ACT inhaler Inhale 2 puffs into the lungs every 6 (six) hours as needed for wheezing or shortness of breath.    [provider]  amLODipine (NORVASC) 5 MG tablet Take 5 mg by mouth 2 (two) times daily. 09/18/18   [provider]  atorvastatin (LIPITOR) 20 MG tablet Take 20 mg by mouth at bedtime.     [provider]  calcium carbonate (OSCAL) 1500 (600 Ca) MG TABS tablet Take 600 mg of elemental calcium by mouth daily.     [provider]  Cholecalciferol (VITAMIN D3) 50000 units TABS Take 50,000 Units by mouth every Friday.     [provider]  CINNAMON PO Take 1,000 mg by mouth daily.     [provider]  cloNIDine (CATAPRES) 0.2 MG tablet Take 1 tablet (0.2 mg total) by mouth 2 (two) times daily. 10/21/18   Mayo, Pete Pelt, MD  DM-APAP-CPM (CORICIDIN HBP) 10-325-2 MG TABS Take 1-2 tablets by mouth 3 (three) times daily as needed (cold symptoms).    [provider]  Dulaglutide (TRULICITY) 1.5 LP/5.3YY SOPN Inject 1.5 mg into the skin every Monday. In the evening    [provider]  ELIQUIS 5 MG TABS tablet TAKE 1 TABLET TWICE A DAY 01/12/19   Lequita Asal, MD  fluticasone-salmeterol (ADVAIR HFA) 230-21 MCG/ACT inhaler Inhale 2 puffs into the lungs 2 (two) times daily.    [provider]  folic acid  (FOLVITE) 511 MCG tablet Take 800 mcg by mouth daily.     [provider]  furosemide (LASIX) 20 MG tablet Take 1 tablet (20 mg) by mouth once daily    [provider]  levothyroxine (SYNTHROID, LEVOTHROID) 112 MCG tablet Take 112 mcg daily before breakfast by mouth.    [provider]  losartan (  COZAAR) 25 MG tablet Take 25 mg by mouth daily.    [provider]  metoprolol tartrate (LOPRESSOR) 50 MG tablet Take 1 tablet (50 mg total) by mouth 2 (two) times daily. 11/21/18   Wellington Hampshire, MD  nystatin (NYSTATIN) powder Apply 1 g topically 2 (two) times daily as needed (rash (skin folds)).    [provider]  potassium chloride SA (KLOR-CON) 20 MEQ tablet Take 1 tablet (20 mEq total) by mouth daily. Patient taking differently: Take 20 mEq by mouth daily after breakfast.  11/12/18   Alisa Graff, FNP  vitamin C (ASCORBIC ACID) 500 MG tablet Take 500 mg by mouth 2 (two) times daily.    [provider]    Family History  Problem Relation Age of Onset  . Diabetes Father   . Hypertension Father   . Parkinson's disease Father   . Hypertension Mother   . Rheum arthritis Mother   . Atrial fibrillation Mother   . Heart failure Mother   . Heart attack Mother        died in her mid-70's.  . Colon cancer Cousin 49  . Breast cancer Neg Hx      Social History   Tobacco Use  . Smoking status: Never Smoker  . Smokeless tobacco: Never Used  Substance Use Topics  . Alcohol use: No    Alcohol/week: 0.0 standard drinks  . Drug use: No    Allergies as of 04/10/2019  . (No Known Allergies)    Review of Systems:    All systems reviewed and negative except where noted in HPI.   Physical Exam:  LMP 07/17/2012  Patient's last menstrual period was 07/17/2012. General:   Alert,  Well-developed, well-nourished, pleasant and cooperative in NAD Head:  Normocephalic and atraumatic. Eyes:  Sclera clear, no icterus.   Conjunctiva pink. Ears:   Normal auditory acuity. Neck:  Supple; no masses or thyromegaly. Lungs:  Respirations even and unlabored.  Clear throughout to auscultation.   No wheezes, crackles, or rhonchi. No acute distress. Heart:  Regular rate and rhythm; no murmurs, clicks, rubs, or gallops. Abdomen:  Normal bowel sounds.  No bruits.  Soft, non-tender and non-distended without masses, hepatosplenomegaly or hernias noted.  No guarding or rebound tenderness.  Negative Carnett sign.   Rectal:  Deferred.  Pulses:  Normal pulses noted. Extremities:  No clubbing or edema.  No cyanosis. Neurologic:  Alert and oriented x3;  grossly normal neurologically. Skin:  Intact without significant lesions or rashes.  No jaundice. Lymph Nodes:  No significant cervical adenopathy. Psych:  Alert and cooperative. Normal mood and affect.  Imaging Studies: No results found.  Assessment and Plan:   DONA WALBY is a 54 y.o. y/o female who comes in today with an isolated alkaline phosphatase that has waxed and waned.  The alkaline phosphatase is not from a liver source or bone source but from an intestinal source.  Since the patient is not having any GI symptoms and does not have any signs of inflammatory bowel disease no further work-up will be instituted on this asymptomatic patient with isolated increased alkaline phosphatase.  The patient has been told that if she has any further symptoms she should let me know and we will address them.    Lucilla Lame, MD. Marval Regal    Note: This dictation was prepared with Dragon dictation along with smaller phrase technology. Any transcriptional errors that result from this process are unintentional.

## 2019-04-14 NOTE — Progress Notes (Signed)
[facility]  [address] [place], [place] [address] Phone: [phone]  Fax: [phone]   Clinic Day:  04/16/2019  Referring physician: Lenard Simmer, MD  Chief Complaint: Nicole Schmidt is a 54 y.o. female with stage I breast cancer, history of submassive pulmonary embolism, and iron deficiency anemia who is seen for a 3 month assessment.  HPI: The patient was last seen in the medical oncology clinic on 02/06/2019 via telephone. At that time, she felt "ok".  She denied any breast concerns. Alkaline phosphatase was 188. She was off oral iron. She remained on Eliquis.   She was seen by Dr. Allen Norris on 04/10/2019. She denied any B symptoms. She had no black or bloody stool. Her alkaline phosphatase was not from a liver source or bone source but from an intestinal source.  Since the patient was not having any GI symptoms and did not have any signs of inflammatory bowel disease no further work-up would be instituted on an asymptomatic patient with isolated increased alkaline phosphatase. She will follow up as needed.   During the interim, the patient has felt "ok". She denies any palpitations. She notes getting a little winded at times. She had no heart issues. She has joint pain secondary to arthritis. Her right arm nodule has resolved. She is not checking her sugar regularly. She performs monthly breast exams. She denies any pain.   She has regular follow ups with Dr. Holley Raring. She is getting labs drawn every 3 months with Dr. Ronnald Collum.   She has been enjoying her retirement since 02/2018. She is in the process of selling one of her two homes while the market is good and interest rates are down.  She is interested in the COVID-19 vaccine.    Past Medical History:  Diagnosis Date  . (HFpEF) heart failure with preserved ejection fraction ([place])    a. 10/2018 Echo: EF 60-65%, diast dysfxn, RVSP 50.7 mmHg, mildly dil LA.  Marland Kitchen Anemia   . Arthritis   . Breast  cancer ([place]) 2014   right breast cancer  . Breast cancer of upper-inner quadrant of right female breast ([place])    Right breast invasive CA and DCIS , 7 mm T1,N0,M0. Er/PR pos, her 2 negative.  Margins 1 mm.  . CHF (congestive heart failure) ([place])   . CKD (chronic kidney disease), stage III   . Diabetes mellitus without complication ([place])   . Essential hypertension   . Gout   . Hypothyroidism   . Menopause    age 1  . Morbid obesity ([place])   . Persistent atrial fibrillation ([place])    a. Dx 10/2018 in setting of PNA. CHA2DS2VASc = 4-->Eliquis; b. 11/2018 s/p successful DCCV.  Marland Kitchen Personal history of radiation therapy   . Pulmonary embolism ([place]) 10/2014   a. Chronic eliquis.  . Thyroid goiter     Past Surgical History:  Procedure Laterality Date  . BREAST BIOPSY Right 2014   breast ca  . BREAST EXCISIONAL BIOPSY Right 07/19/2012   breast ca  . BREAST SURGERY Right 2014   with sentinel node bx subareaolar duct excision  . CARDIOVERSION N/A 11/28/2018   Procedure: CARDIOVERSION;  Surgeon: Minna Merritts, MD;  Location: [facility] ORS;  Service: Cardiovascular;  Laterality: N/A;  . COLONOSCOPY WITH PROPOFOL N/A 01/30/2017   Procedure: COLONOSCOPY WITH PROPOFOL;  Surgeon: Christene Lye, MD;  Location: [facility] ENDOSCOPY;  Service: Endoscopy;  Laterality: N/A;    Family History  Problem Relation  Age of Onset  . Diabetes Father   . Hypertension Father   . Parkinson's disease Father   . Hypertension Mother   . Rheum arthritis Mother   . Atrial fibrillation Mother   . Heart failure Mother   . Heart attack Mother        died in her mid-70's.  . Colon cancer Cousin 20  . Breast cancer Neg Hx     Social History:  reports that she has never smoked. She has never used smokeless tobacco. She reports that she does not drink alcohol or use drugs.  Her mother died of a MI. Her father died in 2015-05-29. He had Parkinson's disease and dementia. She is an only child. 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 She has never married. The patient is alone today.  Allergies: No Known Allergies  Current Medications: Current Outpatient Medications  Medication Sig Dispense Refill  . acetaminophen (TYLENOL) 500 MG tablet Take 1,000 mg by mouth every 6 (six) hours as needed (for pain).    Marland Kitchen albuterol (VENTOLIN HFA) 108 (90 Base) MCG/ACT inhaler Inhale 2 puffs into the lungs every 6 (six) hours as needed for wheezing or shortness of breath.    Marland Kitchen amLODipine (NORVASC) 5 MG tablet Take 5 mg by mouth 2 (two) times daily.    Marland Kitchen atorvastatin (LIPITOR) 20 MG tablet Take 20 mg by mouth at bedtime.     . calcium carbonate (OSCAL) 1500 (600 Ca) MG TABS tablet Take 600 mg of elemental calcium by mouth daily.     . Cholecalciferol (VITAMIN D3) 50000 units TABS Take 50,000 Units by mouth every Friday.     Marland Kitchen CINNAMON PO Take 1,000 mg by mouth daily.     . cloNIDine (CATAPRES) 0.2 MG tablet Take 1 tablet (0.2 mg total) by mouth 2 (two) times daily. 60 tablet 0  . Dulaglutide (TRULICITY) 1.5 VZ/5.6LO SOPN Inject 1.5 mg into the skin every Monday. In the evening    . ELIQUIS 5 MG TABS tablet TAKE 1 TABLET TWICE A DAY 756 tablet 3  . folic acid (FOLVITE) 433 MCG tablet Take 800 mcg by mouth daily.     . furosemide (LASIX) 20 MG tablet Take 1 tablet (20 mg) by mouth once daily    . levothyroxine (SYNTHROID, LEVOTHROID) 112 MCG tablet Take 112 mcg daily before breakfast by mouth.    . losartan (COZAAR) 25 MG tablet Take 25 mg by mouth daily.    . metoprolol tartrate (LOPRESSOR) 50 MG tablet Take 1 tablet (50 mg total) by mouth 2 (two) times daily. 180 tablet 1  . potassium chloride SA (KLOR-CON) 20 MEQ tablet Take 1 tablet (20 mEq total) by mouth daily. (Patient taking differently: Take 20 mEq by mouth daily after breakfast. ) 90 tablet 3  . vitamin C (ASCORBIC ACID) 500 MG tablet Take 500 mg by mouth 2  (two) times daily.    . fluticasone-salmeterol (ADVAIR HFA) 230-21 MCG/ACT inhaler Inhale 2 puffs into the lungs 2 (two) times daily.    Marland Kitchen nystatin (NYSTATIN) powder Apply 1 g topically 2 (two) times daily as needed (rash (skin folds)).     No current facility-administered medications for this visit.    Review of Systems  Constitutional: Negative for chills, diaphoresis, fever, malaise/fatigue and weight loss (up 15 lbs since 08/30/2018).       Feels "ok".  HENT:  Negative.  Negative for congestion, ear pain, hearing loss, nosebleeds, sinus pain and sore throat.   Eyes: Negative.  Negative for blurred vision, double vision, photophobia and pain.  Respiratory: Positive for shortness of breath (mild). Negative for cough, hemoptysis and sputum production.   Cardiovascular: Negative for chest pain, palpitations, orthopnea, claudication and leg swelling.       No cardiac issues.  Gastrointestinal: Negative.  Negative for abdominal pain, blood in stool, constipation, diarrhea, melena, nausea and vomiting.       Normal colonoscopy on 01/30/2017.  Genitourinary: Negative.  Negative for dysuria, frequency, hematuria and urgency.  Musculoskeletal: Positive for joint pain (arthritis). Negative for back pain, falls, myalgias and neck pain.  Skin: Negative.  Negative for itching and rash.       Patient uses Nystatin powder for under her breasts.  Neurological: Negative.  Negative for dizziness, tingling, tremors, sensory change, speech change, focal weakness, weakness and headaches.  Endo/Heme/Allergies: Does not bruise/bleed easily.       Diabetes and HYPOthyroidism.  Psychiatric/Behavioral: Negative.  Negative for depression and memory loss. The patient does not have insomnia.   All other systems reviewed and are negative.  Performance status (ECOG): 1  Vitals Blood pressure (!) 172/51, pulse 61, temperature 97.8 F (36.6 C), temperature source Tympanic, resp. rate 18, height '5\' 4"'  (1.626 m),  weight (!) 347 lb 10.7 oz (157.7 kg), last menstrual period 07/17/2012, SpO2 100 %.   Physical Exam  Constitutional: She is oriented to person, place, and time. She appears well-developed and well-nourished. No distress.  HENT:  Head: Normocephalic and atraumatic.  Mouth/Throat: Oropharynx is clear and moist. No oropharyngeal exudate.  Shoulder length brown hair. Mask.  Eyes: Pupils are equal, round, and reactive to light. Conjunctivae and EOM are normal. No scleral icterus.  Cardiovascular: Normal rate, regular rhythm and normal heart sounds.  No murmur heard. Pulmonary/Chest: Effort normal and breath sounds normal. No respiratory distress. She has no wheezes. She has no rales. She exhibits no tenderness. Right breast exhibits no inverted nipple, no mass, no nipple discharge, no skin change and no tenderness. Left breast exhibits no inverted nipple, no mass, no nipple discharge, no skin change and no tenderness. Breasts are symmetrical.  Area under RIGHT breast moist. LEFT breast moist with some irritation.  Abdominal: Soft. Bowel sounds are normal. She exhibits no distension and no mass. There is no abdominal tenderness. There is no rebound and no guarding.  Musculoskeletal:        General: No tenderness or edema. Normal range of motion.     Cervical back: Normal range of motion and neck supple.  Lymphadenopathy:       Head (right side): No preauricular, no posterior auricular and no occipital adenopathy present.       Head (left side): No preauricular, no posterior auricular and no occipital adenopathy present.    She has no cervical adenopathy.    She has no axillary adenopathy.       Right: No supraclavicular adenopathy present.       Left: No supraclavicular adenopathy present.  Neurological: She is alert and oriented to person, place, and time.  Skin: Skin is warm and dry. She is not diaphoretic.  Psychiatric: She has a normal mood and affect. Her behavior is normal. Judgment and  thought content normal.  Nursing note and vitals reviewed.   Appointment on 04/16/2019  Component Date Value Ref Range Status  . WBC 04/16/2019 11.1* 4.0 - 10.5 K/uL Final  .  RBC 04/16/2019 4.59  3.87 - 5.11 MIL/uL Final  . Hemoglobin 04/16/2019 12.6  12.0 - 15.0 g/dL Final  . HCT 04/16/2019 41.4  36.0 - 46.0 % Final  . MCV 04/16/2019 90.2  80.0 - 100.0 fL Final  . MCH 04/16/2019 27.5  26.0 - 34.0 pg Final  . MCHC 04/16/2019 30.4  30.0 - 36.0 g/dL Final  . RDW 04/16/2019 13.5  11.5 - 15.5 % Final  . Platelets 04/16/2019 247  150 - 400 K/uL Final  . nRBC 04/16/2019 0.0  0.0 - 0.2 % Final  . Neutrophils Relative % 04/16/2019 70  % Final  . Neutro Abs 04/16/2019 7.8* 1.7 - 7.7 K/uL Final  . Lymphocytes Relative 04/16/2019 17  % Final  . Lymphs Abs 04/16/2019 1.9  0.7 - 4.0 K/uL Final  . Monocytes Relative 04/16/2019 9  % Final  . Monocytes Absolute 04/16/2019 1.0  0.1 - 1.0 K/uL Final  . Eosinophils Relative 04/16/2019 3  % Final  . Eosinophils Absolute 04/16/2019 0.3  0.0 - 0.5 K/uL Final  . Basophils Relative 04/16/2019 0  % Final  . Basophils Absolute 04/16/2019 0.0  0.0 - 0.1 K/uL Final  . Immature Granulocytes 04/16/2019 1  % Final  . Abs Immature Granulocytes 04/16/2019 0.09* 0.00 - 0.07 K/uL Final   Performed at [facility], [address], [place], [place] [address]  . Sodium 04/16/2019 139  135 - 145 mmol/L Final  . Potassium 04/16/2019 4.1  3.5 - 5.1 mmol/L Final  . Chloride 04/16/2019 102  98 - 111 mmol/L Final  . CO2 04/16/2019 26  22 - 32 mmol/L Final  . Glucose, Bld 04/16/2019 101* 70 - 99 mg/dL Final   Glucose reference range applies only to samples taken after fasting for at least 8 hours.  . BUN 04/16/2019 35* 6 - 20 mg/dL Final  . Creatinine, Ser 04/16/2019 1.57* 0.44 - 1.00 mg/dL Final  . Calcium 04/16/2019 9.0  8.9 - 10.3 mg/dL Final  . Total Protein 04/16/2019 7.5  6.5 - 8.1 g/dL Final  . Albumin 04/16/2019 3.7  3.5 - 5.0 g/dL Final  . AST  04/16/2019 19  15 - 41 U/L Final  . ALT 04/16/2019 25  0 - 44 U/L Final  . Alkaline Phosphatase 04/16/2019 168* 38 - 126 U/L Final  . Total Bilirubin 04/16/2019 0.6  0.3 - 1.2 mg/dL Final  . GFR calc non Af Amer 04/16/2019 37* >60 mL/min Final  . GFR calc Af Amer 04/16/2019 43* >60 mL/min Final  . Anion gap 04/16/2019 11  5 - 15 Final   Performed at [facility], [address], [place], [place] [address]    Assessment:  Nicole Schmidt is a 54 y.o. female with a history of stage I right breast cancerstatus post wide local incision on 08/12/2012. 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 Sentinel lymph node was negative. Tumor was ER/PR positive and Her2/neu negative. Oncotype DXscore was 14 (low risk).  She completedbreast radiationin 10/2012. She began tamoxifenin 10/2012, but discontinued it on 10/19/2014 following a submassive PE. Shewas onFemarafrom04/27/2017 - late 07/2018.   Bilateral diagnostic mammogramon 09/19/2019revealed no mammographic evidence for malignancy.  Bilateral screening mammogram on 11/20/2018 revealed no mammographic evidence of malignancy.   CA27.29has been followed: 21.7 on 04/24/2013, 21.4 on 07/31/2013, 17.9 on 03/09/2014, 20.7 on 01/18/2015, 17.2 on 06/10/2015, 22.5 on 08/13/2015, 16.6 on 11/08/2015, 19.8 on 01/31/2016, 25.2 on 05/29/2016, 19.3 on 11/30/2016, 22.1 on 05/14/2017,  29.8 on 08/30/2018, and 27.2 on 04/16/2019.  She had a bilateral submassive pulmonary emboli on 10/18/2014. Chest CT angiogram revealed submassive PE with right heart strain. She received catheter directed lytic therapy at [facility]. Lower extremity duplex was negative for DVT. She was on Xarelto (discontinued secondary to oral bleeding). She began Eliquison 07/08/2015.  Hypercoagulable work-uprevealed the following normal studies: Factor V Leiden, prothrombin gene mutation, lupus anticoagulant panel, anticardiolipin  antibodies, protein S total (127%), protein S activity (99%), protein C total (71%), protein C activity (77%), and ATIII activity (82%).  Bone density studyon 01/26/2015 was normalwith a T score of -0.3. She has had no menses since 07/2012. Labs on 01/18/2015 confirmed a post-menopausalstatus. She began [facility] 06/10/2015.   BCItesting indicate a 3% (95% CI: 0.8-5.0%) risk of late recurrence, thus a low likelihood of benefit for extended (5 versus 10 years) adjuvant hormonal therapy.   She was noted to have a normocytic anemiaon 06/10/2015. Labs on 07/08/2015 revealed documented iron deficiency. Ferritin was 25, iron saturation 6%, TIBC 473 (high). Normal labs included B12, folate, SPEP revealed, LFTs, and guaiac cards x 3. She is on oral ironwith vitamin C.  Colonoscopy on 01/30/2017 was normal.  Ferritin has been followed: 25 on 07/08/2015, 30 on 11/08/2015, 46 on 01/31/2016, 37 on 05/29/2016, 53 on 05/14/2017, 92 on 11/14/2017, and 115 on 08/30/2018.   She was noted to havestage III renal insufficiencyon 06/10/2015. Creatinine was 1.34 (CrCl 45 ml/min) with prior baseline Cr of 0.86 - 0.93. She was seen by nephrology. Etiology was felt secondary to dehydration.  Echocardiogramon 11/20/2017 demonstrated an LVEF of 55-60%. Bone densityon 11/29/2017 that revealed a normal T-score of -1.0 in the LEFT femoral neck.   [facility]F (creatinine 4.54) secondary to volume depletion, as patient was on both a loop and thiazide diuretic. Patient underwent gentle IV rehydration. Renal function deteriorated to aBUN 62 and creatine 6.3. She underwent dialysis on 11/28/2017. Discharge labs revealed a BUN of 46 and creatinine of 2.62.  She was admitted to [facility] 03/29/2018 - 04/03/2018.She received Tamiflu for influenza A. She remained hospitalized secondary tohypoxia. She did not go home on oxygen.  Symptomatically, she  is doing well.  Exam reveals no evidence of recurrent disease.  Area under breast remains moist.  Plan: 1.   Labs today: CBC with diff, CMP, CA 27.29. 2.  Stage I RIGHT breast cancer Clinically, she continues to do well.  Exam reveals no evidence of recurrent disease. CA27.29 is normal. BCI testing revealed a low risk of late recurrence. Shecompleted 5 years of endocrine therapy in late 07/2018.  Bilateral screening mammogram on 11/20/2018 revealed no mammographic evidence of malignancy. Schedule mammogram on 11/20/2019. Discuss yearly follow-up after next visit. 3.Renal insufficiency Creatinine1.45 on 01/02/2019 and 1.57 today. Continue to monitor. 4.Elevated alkaline phosphatase. Alkaline phosphatase was 109 (38-126) on 03/29/2018. Alkaline phosphatase was 188 (36% bone, 39% liver and 25% intestinal).   Without intestinal component alkaline phosphatase 141.  Alkaline phosphatase is 168 today.  Review interval GI consult.  Etiology felt secondary to intestinal source.   No intervention needed.  Continue to monitor. 5.Iron deficiency anemia, resolved Hematocrit 41.4.  Hemoglobin12.6.  MCV90.2. Ferritin115 on 08/30/2018. Patient off oral iron. Check iron stores in the future if MCV becomes microcytic or anemia recurs. 6.History of BILATERAL pulmonary emboli She is on chronic anticoagulation. Continue Eliquis. 7.   RTC after mammogram for MD assessment, labs (CBC with diff, CMP, CA27.29), and review of  interval mammogram. 8.   Anticipate yearly follow-up after next visit.  I discussed the assessment and treatment plan with the patient.  The patient was provided an opportunity to ask questions and all were answered.  The patient agreed with the plan and demonstrated an understanding of the instructions.  The patient was advised to call back if the symptoms worsen or if the condition fails to improve as anticipated.  I  provided 18 minutes of face-to-face time during this this encounter and > 50% was spent counseling as documented under my assessment and plan.  An additional 5 minutes were spent reviewing her chart (Epic and Care Everywhere) including notes, labs, and imaging studies.    Lequita Asal, MD, PhD    04/16/2019, 4:13 PM  I, Selena Batten, am acting as scribe for [employer]. Mike Gip, MD, PhD.  I, Maudean Hoffmann C. Mike Gip, MD, have reviewed the above documentation for accuracy and completeness, and I agree with the above.

## 2019-04-15 ENCOUNTER — Encounter: Payer: Self-pay | Admitting: Hematology and Oncology

## 2019-04-15 ENCOUNTER — Other Ambulatory Visit: Payer: Self-pay

## 2019-04-15 DIAGNOSIS — C50911 Malignant neoplasm of unspecified site of right female breast: Secondary | ICD-10-CM

## 2019-04-15 NOTE — Progress Notes (Signed)
Confirmed patient name and DOB for phone assessment. Patient has no questions or concerns at this time. Patient states she has new insurance and will bring in her new card for Nicole Schmidt to update her info.

## 2019-04-16 ENCOUNTER — Telehealth: Payer: Self-pay

## 2019-04-16 ENCOUNTER — Inpatient Hospital Stay (HOSPITAL_BASED_OUTPATIENT_CLINIC_OR_DEPARTMENT_OTHER): Payer: BC Managed Care – PPO | Admitting: Hematology and Oncology

## 2019-04-16 ENCOUNTER — Encounter: Payer: Self-pay | Admitting: Hematology and Oncology

## 2019-04-16 ENCOUNTER — Inpatient Hospital Stay: Payer: BC Managed Care – PPO | Attending: Hematology and Oncology

## 2019-04-16 VITALS — BP 172/51 | HR 61 | Temp 97.8°F | Resp 18 | Ht 64.0 in | Wt 347.7 lb

## 2019-04-16 DIAGNOSIS — C50911 Malignant neoplasm of unspecified site of right female breast: Secondary | ICD-10-CM

## 2019-04-16 DIAGNOSIS — N289 Disorder of kidney and ureter, unspecified: Secondary | ICD-10-CM | POA: Diagnosis not present

## 2019-04-16 DIAGNOSIS — I2782 Chronic pulmonary embolism: Secondary | ICD-10-CM

## 2019-04-16 DIAGNOSIS — Z7901 Long term (current) use of anticoagulants: Secondary | ICD-10-CM | POA: Diagnosis not present

## 2019-04-16 DIAGNOSIS — Z86711 Personal history of pulmonary embolism: Secondary | ICD-10-CM | POA: Insufficient documentation

## 2019-04-16 DIAGNOSIS — Z923 Personal history of irradiation: Secondary | ICD-10-CM | POA: Diagnosis not present

## 2019-04-16 DIAGNOSIS — R748 Abnormal levels of other serum enzymes: Secondary | ICD-10-CM | POA: Insufficient documentation

## 2019-04-16 DIAGNOSIS — Z853 Personal history of malignant neoplasm of breast: Secondary | ICD-10-CM | POA: Diagnosis not present

## 2019-04-16 DIAGNOSIS — Z800 Family history of malignant neoplasm of digestive organs: Secondary | ICD-10-CM | POA: Insufficient documentation

## 2019-04-16 DIAGNOSIS — D509 Iron deficiency anemia, unspecified: Secondary | ICD-10-CM

## 2019-04-16 DIAGNOSIS — Z170 Estrogen receptor positive status [ER+]: Secondary | ICD-10-CM

## 2019-04-16 LAB — CBC WITH DIFFERENTIAL/PLATELET
Abs Immature Granulocytes: 0.09 10*3/uL — ABNORMAL HIGH (ref 0.00–0.07)
Basophils Absolute: 0 10*3/uL (ref 0.0–0.1)
Basophils Relative: 0 %
Eosinophils Absolute: 0.3 10*3/uL (ref 0.0–0.5)
Eosinophils Relative: 3 %
HCT: 41.4 % (ref 36.0–46.0)
Hemoglobin: 12.6 g/dL (ref 12.0–15.0)
Immature Granulocytes: 1 %
Lymphocytes Relative: 17 %
Lymphs Abs: 1.9 10*3/uL (ref 0.7–4.0)
MCH: 27.5 pg (ref 26.0–34.0)
MCHC: 30.4 g/dL (ref 30.0–36.0)
MCV: 90.2 fL (ref 80.0–100.0)
Monocytes Absolute: 1 10*3/uL (ref 0.1–1.0)
Monocytes Relative: 9 %
Neutro Abs: 7.8 10*3/uL — ABNORMAL HIGH (ref 1.7–7.7)
Neutrophils Relative %: 70 %
Platelets: 247 10*3/uL (ref 150–400)
RBC: 4.59 MIL/uL (ref 3.87–5.11)
RDW: 13.5 % (ref 11.5–15.5)
WBC: 11.1 10*3/uL — ABNORMAL HIGH (ref 4.0–10.5)
nRBC: 0 % (ref 0.0–0.2)

## 2019-04-16 LAB — COMPREHENSIVE METABOLIC PANEL
ALT: 25 U/L (ref 0–44)
AST: 19 U/L (ref 15–41)
Albumin: 3.7 g/dL (ref 3.5–5.0)
Alkaline Phosphatase: 168 U/L — ABNORMAL HIGH (ref 38–126)
Anion gap: 11 (ref 5–15)
BUN: 35 mg/dL — ABNORMAL HIGH (ref 6–20)
CO2: 26 mmol/L (ref 22–32)
Calcium: 9 mg/dL (ref 8.9–10.3)
Chloride: 102 mmol/L (ref 98–111)
Creatinine, Ser: 1.57 mg/dL — ABNORMAL HIGH (ref 0.44–1.00)
GFR calc Af Amer: 43 mL/min — ABNORMAL LOW (ref >60)
GFR calc non Af Amer: 37 mL/min — ABNORMAL LOW (ref >60)
Glucose, Bld: 101 mg/dL — ABNORMAL HIGH (ref 70–99)
Potassium: 4.1 mmol/L (ref 3.5–5.1)
Sodium: 139 mmol/L (ref 135–145)
Total Bilirubin: 0.6 mg/dL (ref 0.3–1.2)
Total Protein: 7.5 g/dL (ref 6.5–8.1)

## 2019-04-16 NOTE — Telephone Encounter (Signed)
Labs forwarded to Dr. Ronnald Collum, PCP to review per Dr. Kem Parkinson request.

## 2019-04-16 NOTE — Telephone Encounter (Signed)
-----   Message from Lequita Asal, MD sent at 04/16/2019  4:51 PM EST ----- Regarding: Please forward chemistries to PCP  ----- Message ----- From: Interface, Lab In [place] Sent: 04/16/2019   3:28 PM EST To: Lequita Asal, MD

## 2019-04-17 LAB — CANCER ANTIGEN 27.29: CA 27.29: 27.2 U/mL (ref 0.0–38.6)

## 2019-04-25 ENCOUNTER — Other Ambulatory Visit: Payer: Self-pay

## 2019-04-25 ENCOUNTER — Ambulatory Visit: Payer: BC Managed Care – PPO

## 2019-04-25 DIAGNOSIS — R0683 Snoring: Secondary | ICD-10-CM

## 2019-04-26 DIAGNOSIS — G4733 Obstructive sleep apnea (adult) (pediatric): Secondary | ICD-10-CM

## 2019-04-29 ENCOUNTER — Telehealth: Payer: Self-pay | Admitting: Pulmonary Disease

## 2019-04-29 DIAGNOSIS — G4733 Obstructive sleep apnea (adult) (pediatric): Secondary | ICD-10-CM | POA: Diagnosis not present

## 2019-04-29 NOTE — Telephone Encounter (Signed)
Called and spoke with Patient. Dr. Juanetta Gosling results and recommendations given.  Patient offered in office OV, Patient did not want to come to [facility] office, if possible.  Offered my chart visit, Patient stated she is not very technical.  Patient scheduled tele visit, with Beth,NP, at 12pm, 05/01/19. Nothing further at this time.

## 2019-04-29 NOTE — Telephone Encounter (Signed)
HST 04/26/19 >> AHI 17.1, SpO2 low 54%.  Spent 233.9 min with SpO2 < 89%.   Please inform her that her sleep study shows moderate obstructive sleep apnea and sleep related hypoventilation (shallow breathing).  Please arrange for ROV with me or NP to discuss treatment options.

## 2019-05-01 ENCOUNTER — Ambulatory Visit (INDEPENDENT_AMBULATORY_CARE_PROVIDER_SITE_OTHER): Payer: BC Managed Care – PPO | Admitting: Primary Care

## 2019-05-01 ENCOUNTER — Other Ambulatory Visit: Payer: Self-pay

## 2019-05-01 DIAGNOSIS — G4733 Obstructive sleep apnea (adult) (pediatric): Secondary | ICD-10-CM | POA: Diagnosis not present

## 2019-05-01 NOTE — Progress Notes (Signed)
Virtual Visit via Telephone Note  I connected with Nicole Schmidt on 05/01/19 at 12:00 PM EDT by telephone and verified that I am speaking with the correct person using two identifiers.  Location: Patient: Home Provider: Office    I discussed the limitations, risks, security and privacy concerns of performing an evaluation and management service by telephone and the availability of in person appointments. I also discussed with the patient that there may be a patient responsible charge related to this service. The patient expressed understanding and agreed to proceed.   History of Present Illness: 54 year old female, never smoked. PMH significant for acute hypoxemia respiratory failure, afib, HTN, diastolic heart failure, pulmonary embolism, chronic renal disease, malignant neoplasm right breast. Patient of Dr. Halford Chessman, seen at the [facility] [place] office for initial consult on 01/14/19 for daytime fatigue/snoring. Family history of sleep apnea. [place] 6/24. Ordered for HST.  05/01/2019 Patient contacted today to review home sleep test. She feels well. Reports very mild daytime fatigue. She will at times takes a nap twice a day on the couch. States that she recently retired in January so her sleep schedule is off. Her mother has sleep apnea. HST on 04/26/19 showed moderate OSA with SpO2 low 54%. Review test results in full, treatment options include weight loss, side sleeping position, oral appliance, CPAP and referral to ENT. Due to concern for hypoventilation and hypoxemia, recommend in-lab CPAP titration study. She states that this is not what she wanted to hear but she is agreeing to further testing and treatment with CPAP as directed.    Observations/Objective:  - Able to speak in full sentences; no overt shortness of breath or wheezing  Assessment and Plan:  Moderate obstructive sleep apnea: - HST on 04/26/19 showed moderate OSA with AHI 17.1, SpO2 low 54% (average 86%) - Concern for sleep related  hypoventilation/hypoxia - Discussed sleep results in full with patient and treatment options  - Continue to encourage patient work on weight loss  - Order in-lab CPAP titration study  - She is agreeing with above plan and to try CPAP therapy   Follow Up Instructions:  - After CPAP-titration study, will need FU in 4-6 weeks in [place] office    I discussed the assessment and treatment plan with the patient. The patient was provided an opportunity to ask questions and all were answered. The patient agreed with the plan and demonstrated an understanding of the instructions.   The patient was advised to call back or seek an in-person evaluation if the symptoms worsen or if the condition fails to improve as anticipated.  I provided 22 minutes of non-face-to-face time during this encounter.   Martyn Ehrich, NP

## 2019-05-01 NOTE — Progress Notes (Signed)
Reviewed and agree with assessment/plan.   Tajuan Dufault, MD [facility] 02/09/2016, 12:24 PM Pager:  [phone]  

## 2019-05-01 NOTE — Patient Instructions (Signed)
Moderate obstructive sleep apnea: - HST on 04/26/19 showed moderate OSA with AHI 17.1, SpO2 low 54% (average 86%) - Concern for sleep related hypoventilation/hypoxia - Continue to encourage weight loss   Orders: - Needs in-lab CPAP titration study   Follow-up: - After CPAP-titration study, will need FU in 4-6 weeks in [place] office     Sleep Apnea Sleep apnea affects breathing during sleep. It causes breathing to stop for a short time or to become shallow. It can also increase the risk of:  Heart attack.  Stroke.  Being very overweight (obese).  Diabetes.  Heart failure.  Irregular heartbeat. The goal of treatment is to help you breathe normally again. What are the causes? There are three kinds of sleep apnea:  Obstructive sleep apnea. This is caused by a blocked or collapsed airway.  Central sleep apnea. This happens when the brain does not send the right signals to the muscles that control breathing.  Mixed sleep apnea. This is a combination of obstructive and central sleep apnea. The most common cause of this condition is a collapsed or blocked airway. This can happen if:  Your throat muscles are too relaxed.  Your tongue and tonsils are too large.  You are overweight.  Your airway is too small. What increases the risk?  Being overweight.  Smoking.  Having a small airway.  Being older.  Being female.  Drinking alcohol.  Taking medicines to calm yourself (sedatives or tranquilizers).  Having family members with the condition. What are the signs or symptoms?  Trouble staying asleep.  Being sleepy or tired during the day.  Getting angry a lot.  Loud snoring.  Headaches in the morning.  Not being able to focus your mind (concentrate).  Forgetting things.  Less interest in sex.  Mood swings.  Personality changes.  Feelings of sadness (depression).  Waking up a lot during the night to pee (urinate).  Dry mouth.  Sore  throat. How is this diagnosed?  Your medical history.  A physical exam.  A test that is done when you are sleeping (sleep study). The test is most often done in a sleep lab but may also be done at home. How is this treated?   Sleeping on your side.  Using a medicine to get rid of mucus in your nose (decongestant).  Avoiding the use of alcohol, medicines to help you relax, or certain pain medicines (narcotics).  Losing weight, if needed.  Changing your diet.  Not smoking.  Using a machine to open your airway while you sleep, such as: ? An oral appliance. This is a mouthpiece that shifts your lower jaw forward. ? A CPAP device. This device blows air through a mask when you breathe out (exhale). ? An EPAP device. This has valves that you put in each nostril. ? A BPAP device. This device blows air through a mask when you breathe in (inhale) and breathe out.  Having surgery if other treatments do not work. It is important to get treatment for sleep apnea. Without treatment, it can lead to:  High blood pressure.  Coronary artery disease.  In men, not being able to have an erection (impotence).  Reduced thinking ability. Follow these instructions at home: Lifestyle  Make changes that your doctor recommends.  Eat a healthy diet.  Lose weight if needed.  Avoid alcohol, medicines to help you relax, and some pain medicines.  Do not use any products that contain nicotine or tobacco, such as cigarettes, e-cigarettes, and chewing  tobacco. If you need help quitting, ask your doctor. General instructions  Take over-the-counter and prescription medicines only as told by your doctor.  If you were given a machine to use while you sleep, use it only as told by your doctor.  If you are having surgery, make sure to tell your doctor you have sleep apnea. You may need to bring your device with you.  Keep all follow-up visits as told by your doctor. This is important. Contact a  doctor if:  The machine that you were given to use during sleep bothers you or does not seem to be working.  You do not get better.  You get worse. Get help right away if:  Your chest hurts.  You have trouble breathing in enough air.  You have an uncomfortable feeling in your back, arms, or stomach.  You have trouble talking.  One side of your body feels weak.  A part of your face is hanging down. These symptoms may be an emergency. Do not wait to see if the symptoms will go away. Get medical help right away. Call your local emergency services (911 in the [place] Do not drive yourself to the hospital. Summary  This condition affects breathing during sleep.  The most common cause is a collapsed or blocked airway.  The goal of treatment is to help you breathe normally while you sleep. This information is not intended to replace advice given to you by your health care provider. Make sure you discuss any questions you have with your health care provider. Document Revised: 11/16/2017 Document Reviewed: 09/25/2017 Elsevier Patient Education  [place].

## 2019-05-02 ENCOUNTER — Other Ambulatory Visit: Payer: Self-pay | Admitting: Cardiovascular Disease

## 2019-05-08 ENCOUNTER — Other Ambulatory Visit: Payer: Self-pay

## 2019-05-08 ENCOUNTER — Ambulatory Visit (INDEPENDENT_AMBULATORY_CARE_PROVIDER_SITE_OTHER): Payer: BC Managed Care – PPO | Admitting: Cardiovascular Disease

## 2019-05-08 ENCOUNTER — Encounter: Payer: Self-pay | Admitting: Cardiovascular Disease

## 2019-05-08 VITALS — BP 132/76 | HR 65 | Ht 64.0 in | Wt 345.5 lb

## 2019-05-08 DIAGNOSIS — I10 Essential (primary) hypertension: Secondary | ICD-10-CM

## 2019-05-08 DIAGNOSIS — I5032 Chronic diastolic (congestive) heart failure: Secondary | ICD-10-CM

## 2019-05-08 DIAGNOSIS — I4819 Other persistent atrial fibrillation: Secondary | ICD-10-CM | POA: Diagnosis not present

## 2019-05-08 NOTE — Progress Notes (Signed)
Cardiology Office Note   Date:  05/08/2019   ID:  [name], DOB 17-Oct-1965, MRN [id number]  PCP:  Lenard Simmer, MD  Cardiologist:   Kathlyn Sacramento, MD   Chief Complaint  Patient presents with  . OTHER    3 month f/u c/o edema ankles/legs. Meds reviewed verbally with pt.      History of Present Illness: Nicole Schmidt is a 54 y.o. female who presents for for follow-up visit regarding atrial fibrillation and chronic diastolic heart failure. She has known history of breast cancer status post radiation, diabetes mellitus, essential hypertension, chronic kidney disease, thyroid disease, morbid obesity, anemia of chronic disease, gout, pulmonary embolism and chronic diastolic heart failure. She was hospitalized in September 2020 with A. fib with RVR.  She was diagnosed with community-acquired pneumonia which was treated with antibiotics.  She was started on metoprolol 50 mg twice daily for atrial fibrillation.  She was already on Eliquis for previous pulmonary embolism and this was continued.  Echocardiogram showed normal LV systolic function with diastolic dysfunction and moderate pulmonary hypertension.  She underwent successful cardioversion in October.  No recurrent atrial fibrillation since then.  She is tolerating anticoagulation with Eliquis.  She denies chest pain or worsening dyspnea.  No palpitations. She is scheduled to get a sleep study done in the near future.    Past Medical History:  Diagnosis Date  . (HFpEF) heart failure with preserved ejection fraction ([place])    a. 10/2018 Echo: EF 60-65%, diast dysfxn, RVSP 50.7 mmHg, mildly dil LA.  Marland Kitchen Anemia   . Arthritis   . Breast cancer ([place]) 2014   right breast cancer  . Breast cancer of upper-inner quadrant of right female breast ([place])    Right breast invasive CA and DCIS , 7 mm T1,N0,M0. Er/PR pos, her 2 negative.  Margins 1 mm.  . CHF (congestive heart failure) ([place])   . CKD (chronic kidney disease), stage III   .  Diabetes mellitus without complication ([place])   . Essential hypertension   . Gout   . Hypothyroidism   . Menopause    age 50  . Morbid obesity ([place])   . Persistent atrial fibrillation ([place])    a. Dx 10/2018 in setting of PNA. CHA2DS2VASc = 4-->Eliquis; b. 11/2018 s/p successful DCCV.  Marland Kitchen Personal history of radiation therapy   . Pulmonary embolism ([place]) 10/2014   a. Chronic eliquis.  . Thyroid goiter     Past Surgical History:  Procedure Laterality Date  . BREAST BIOPSY Right 2014   breast ca  . BREAST EXCISIONAL BIOPSY Right 07/19/2012   breast ca  . BREAST SURGERY Right 2014   with sentinel node bx subareaolar duct excision  . CARDIOVERSION N/A 11/28/2018   Procedure: CARDIOVERSION;  Surgeon: Minna Merritts, MD;  Location: [facility] ORS;  Service: Cardiovascular;  Laterality: N/A;  . COLONOSCOPY WITH PROPOFOL N/A 01/30/2017   Procedure: COLONOSCOPY WITH PROPOFOL;  Surgeon: Christene Lye, MD;  Location: [facility] ENDOSCOPY;  Service: Endoscopy;  Laterality: N/A;     Current Outpatient Medications  Medication Sig Dispense Refill  . acetaminophen (TYLENOL) 500 MG tablet Take 1,000 mg by mouth every 6 (six) hours as needed (for pain).    Marland Kitchen albuterol (VENTOLIN HFA) 108 (90 Base) MCG/ACT inhaler Inhale 2 puffs into the lungs every 6 (six) hours as needed for wheezing or shortness of breath.    Marland Kitchen amLODipine (NORVASC) 5 MG tablet Take 5 mg by mouth 2 (two)  times daily.    Marland Kitchen atorvastatin (LIPITOR) 20 MG tablet Take 20 mg by mouth at bedtime.     . calcium carbonate (OSCAL) 1500 (600 Ca) MG TABS tablet Take 600 mg of elemental calcium by mouth daily.     . Cholecalciferol (VITAMIN D3) 50000 units TABS Take 50,000 Units by mouth every Friday.     Marland Kitchen CINNAMON PO Take 1,000 mg by mouth daily.     . cloNIDine (CATAPRES) 0.2 MG tablet Take 1 tablet (0.2 mg total) by mouth 2 (two) times daily. 60 tablet 0  . Dulaglutide (TRULICITY) 1.5 VV/6.1YW SOPN Inject 1.5 mg into the skin every Monday.  In the evening    . ELIQUIS 5 MG TABS tablet TAKE 1 TABLET TWICE A DAY 180 tablet 3  . fluticasone-salmeterol (ADVAIR HFA) 230-21 MCG/ACT inhaler Inhale 2 puffs into the lungs 2 (two) times daily as needed.     . folic acid (FOLVITE) 737 MCG tablet Take 800 mcg by mouth daily.     . furosemide (LASIX) 20 MG tablet Take 1 tablet (20 mg) by mouth once daily    . levothyroxine (SYNTHROID, LEVOTHROID) 112 MCG tablet Take 112 mcg daily before breakfast by mouth.    . losartan (COZAAR) 25 MG tablet Take 25 mg by mouth daily.    . metoprolol tartrate (LOPRESSOR) 50 MG tablet TAKE 1 TABLET TWICE A DAY 180 tablet 0  . nystatin (NYSTATIN) powder Apply 1 g topically 2 (two) times daily as needed (rash (skin folds)).    Marland Kitchen potassium chloride SA (KLOR-CON) 20 MEQ tablet Take 1 tablet (20 mEq total) by mouth daily. (Patient taking differently: Take 20 mEq by mouth daily after breakfast. ) 90 tablet 3  . vitamin C (ASCORBIC ACID) 500 MG tablet Take 500 mg by mouth 2 (two) times daily.     No current facility-administered medications for this visit.    Allergies:   Patient has no known allergies.    Social History:  The patient  reports that she has never smoked. She has never used smokeless tobacco. She reports that she does not drink alcohol or use drugs.   Family History:  The patient's family history includes Atrial fibrillation in her mother; Colon cancer (age of onset: 49) in her cousin; Diabetes in her father; Heart attack in her mother; Heart failure in her mother; Hypertension in her father and mother; Parkinson's disease in her father; Rheum arthritis in her mother.    ROS:  Please see the history of present illness.   Otherwise, review of systems are positive for none.   All other systems are reviewed and negative.    PHYSICAL EXAM: VS:  BP 132/76 (BP Location: Left Wrist, Patient Position: Sitting, Cuff Size: Large)   Pulse 65   Ht 5\' 4"  (1.626 m)   Wt (!) 345 lb 8 oz (156.7 kg)   LMP  07/17/2012   SpO2 98%   BMI 59.30 kg/m  , BMI Body mass index is 59.3 kg/m. GEN: Well nourished, well developed, in no acute distress  HEENT: normal  Neck: no JVD, carotid bruits, or masses Cardiac: RRR; no murmurs, rubs, or gallops,no edema  Respiratory:  clear to auscultation bilaterally, normal work of breathing GI: soft, nontender, nondistended, + BS MS: no deformity or atrophy  Skin: warm and dry, no rash Neuro:  Strength and sensation are intact Psych: euthymic mood, full affect   EKG:  EKG is ordered today. The ekg ordered today demonstrates normal sinus rhythm  with first-degree AV block and no significant ST or T wave changes.   Recent Labs: 10/18/2018: TSH 0.065 10/30/2018: B Natriuretic Peptide 156.0 04/16/2019: ALT 25; BUN 35; Creatinine, Ser 1.57; Hemoglobin 12.6; Platelets 247; Potassium 4.1; Sodium 139    Lipid Panel No results found for: CHOL, TRIG, HDL, CHOLHDL, VLDL, LDLCALC, LDLDIRECT    Wt Readings from Last 3 Encounters:  05/08/19 (!) 345 lb 8 oz (156.7 kg)  04/16/19 (!) 347 lb 10.7 oz (157.7 kg)  02/04/19 (!) 345 lb (156.5 kg)        No flowsheet data found.    ASSESSMENT AND PLAN:  1. Persistent atrial fibrillation: The patient is maintaining in sinus rhythm after successful cardioversion in October.  Chads vas score is 4 and currently she is on Eliquis.   2. Chronic diastolic heart failure: This was mostly in the setting of A. fib with RVR and currently she appears to be euvolemic on small dose furosemide.   3.  Essential hypertension: Blood pressure is well controlled on current medications.   4. Morbid obesity: She has not been able to lose much weight. 5. History of pulmonary embolism: On anticoagulation with Eliquis. 6. Possible underlying sleep apnea: She is scheduled for a sleep study in the near future and has been following with Dr. Halford Chessman.  I explained to her the importance of treating any underlying sleep apnea in order to decrease the risk  of recurrent atrial fibrillation.    Disposition:   FU with me in 6 months  Signed,  Kathlyn Sacramento, MD  05/08/2019 2:19 PM    [facility]

## 2019-05-08 NOTE — Patient Instructions (Signed)

## 2019-05-20 ENCOUNTER — Other Ambulatory Visit
Admission: RE | Admit: 2019-05-20 | Discharge: 2019-05-20 | Disposition: A | Discharge status: Home / Self Care | Payer: BC Managed Care – PPO | Source: Ambulatory Visit | Attending: Pulmonary Disease | Admitting: Pulmonary Disease

## 2019-05-20 DIAGNOSIS — Z20822 Contact with and (suspected) exposure to covid-19: Secondary | ICD-10-CM | POA: Diagnosis not present

## 2019-05-20 DIAGNOSIS — Z01812 Encounter for preprocedural laboratory examination: Secondary | ICD-10-CM | POA: Diagnosis not present

## 2019-05-20 LAB — SARS CORONAVIRUS 2 (TAT 6-24 HRS): SARS Coronavirus 2: NEGATIVE

## 2019-05-22 ENCOUNTER — Other Ambulatory Visit: Payer: Self-pay

## 2019-05-22 ENCOUNTER — Ambulatory Visit (HOSPITAL_BASED_OUTPATIENT_CLINIC_OR_DEPARTMENT_OTHER): Payer: BC Managed Care – PPO | Attending: Primary Care | Admitting: Pulmonary Disease

## 2019-05-22 DIAGNOSIS — Z79899 Other long term (current) drug therapy: Secondary | ICD-10-CM | POA: Insufficient documentation

## 2019-05-22 DIAGNOSIS — Z7901 Long term (current) use of anticoagulants: Secondary | ICD-10-CM | POA: Insufficient documentation

## 2019-05-22 DIAGNOSIS — G4733 Obstructive sleep apnea (adult) (pediatric): Secondary | ICD-10-CM | POA: Diagnosis present

## 2019-05-30 ENCOUNTER — Telehealth: Payer: Self-pay | Admitting: Pulmonary Disease

## 2019-05-30 DIAGNOSIS — G4733 Obstructive sleep apnea (adult) (pediatric): Secondary | ICD-10-CM | POA: Diagnosis not present

## 2019-05-30 NOTE — Telephone Encounter (Signed)
CPAP titration 05/22/19 >> CPAP 15 cm H2O >> AHI 2.2   Please let her know she did well with CPAP at 15 cm H2O.  Please send order to arrange for CPAP set up with pressure of 15 cm H2O, heated humidity, and mask of choice.  Length of need is indefinite, lifelong, 99.    She then needs ROV with me or NP in 2 months after CPAP setup.

## 2019-05-30 NOTE — Procedures (Signed)
    Patient Name: Nicole Schmidt, Nicole Schmidt Date: 05/22/2019 Gender: Female D.O.B: 10/07/1965 Age (years): 27 Referring Provider: Geraldo Pitter NP Height (inches): 64 Interpreting Physician: Chesley Mires MD, ABSM Weight (lbs): 350 RPSGT: Zadie Rhine BMI: 60 MRN: [id number] Neck Size: 18.00  CLINICAL INFORMATION The patient is referred for a CPAP titration to treat sleep apnea.  Date of HST 04/26/19:  AHI 17.1, SpO2 low 54%, spent 233.9 min test time with SpO2 < 89%.  SLEEP STUDY TECHNIQUE As per the AASM Manual for the Scoring of Sleep and Associated Events v2.3 (April 2016) with a hypopnea requiring 4% desaturations.  The channels recorded and monitored were frontal, central and occipital EEG, electrooculogram (EOG), submentalis EMG (chin), nasal and oral airflow, thoracic and abdominal wall motion, anterior tibialis EMG, snore microphone, electrocardiogram, and pulse oximetry. Continuous positive airway pressure (CPAP) was initiated at the beginning of the study and titrated to treat sleep-disordered breathing.  MEDICATIONS Medications self-administered by patient taken the night of the study : BENADRYL, LIPITOR, CLONIDINE, LOPRESSOR, ELIQUIS, NORVASC, VITAMIN C  TECHNICIAN COMMENTS Comments added by technician: [place]. Patient sleep in a recliner chair. Patient had difficulty initiating sleep. Comments added by scorer: N/A  RESPIRATORY PARAMETERS Optimal PAP Pressure (cm): 15 AHI at Optimal Pressure (/hr): 2.2 Overall Minimal O2 (%): 64.0 Supine % at Optimal Pressure (%): 0 Minimal O2 at Optimal Pressure (%): 83.0   SLEEP ARCHITECTURE The study was initiated at 10:58:21 PM and ended at 4:59:22 AM.  Sleep onset time was 25.0 minutes and the sleep efficiency was 77.8%%. The total sleep time was 281 minutes.  The patient spent 4.1%% of the night in stage N1 sleep, 73.1%% in stage N2 sleep, 10.5%% in stage N3 and 12.3% in REM.Stage REM latency was 62.0  minutes  Wake after sleep onset was 55.0. Alpha intrusion was absent. Supine sleep was 0.00%.  CARDIAC DATA The 2 lead EKG demonstrated sinus rhythm. The mean heart rate was 61.1 beats per minute. Other EKG findings include: None.  LEG MOVEMENT DATA The total Periodic Limb Movements of Sleep (PLMS) were 0. The PLMS index was 0.0. A PLMS index of <15 is considered normal in adults.  IMPRESSIONS - The optimal PAP pressure was 15 cm of water. - She did not need supplemental oxygen during this study. - Central sleep apnea was not noted during this titration (CAI = 0.0/h). - Severe oxygen desaturations were observed during this titration (min O2 = 64.0%). - No snoring was audible during this study. - No cardiac abnormalities were observed during this study.  DIAGNOSIS - Obstructive Sleep Apnea (327.23 [G47.33 ICD-10])  RECOMMENDATIONS - Trial of CPAP therapy on 15 cm H2O with a Small size Resmed Full Face Mask AirFit F20 mask and heated humidification.  [Electronically signed] 05/30/2019 10:11 AM  Chesley Mires MD, ABSM Diplomate, American Board of Sleep Medicine   NPI: [id number]

## 2019-06-02 NOTE — Telephone Encounter (Signed)
Left message for patient to call back  

## 2019-06-03 NOTE — Telephone Encounter (Signed)
Called and spoke with pt letting her know the results of the titration study and stated to her that we would place order for her to begin on cpap. Pt verbalized understanding. Also stated to pt that VS wanted her to follow up in 2 months and she verbalized understanding. Order placed for cpap start and also scheduled pt a follow up appt. Nothing further needed.

## 2019-06-03 NOTE — Telephone Encounter (Signed)
Pt calling back to get HST results will be free until 2:00pm. If pt does not pick up, she gives verbal consent to detailed vm.

## 2019-07-26 NOTE — Progress Notes (Signed)
Patient ID: Nicole Schmidt, female    DOB: January 06, 1966, 54 y.o.   MRN: [id number]  HPI  [name] is a 54 y/o female with a history of breast cancer, DM, HTN, CKD, thyroid disease, anemia, gout, pulmonary embolus and chronic heart failure.   Echo report from 10/20/2018 reviewed and showed an EF of 60-65% along with an elevated PA pressure of 50.7 mmHg. Echo report from 11/20/17 reviewed and showed an EF of 55-60%.  Has not been admitted or been in the ED in the last 6 months.   She presents today for a follow-up visit with a chief complaint of moderate shortness of breath upon minimal exertion. She describes this as chronic in nature having been present for several years. She has associated fatigue, pedal edema, difficulty sleeping and gradual weight gain along with this. She denies any abdominal distention, palpitations, chest pain, dizziness or cough.   She says that she retired January 2021 and has gradually gained weight as she's not as active as she previously was.      Past Medical History:  Diagnosis Date  . (HFpEF) heart failure with preserved ejection fraction ([place])    a. 10/2018 Echo: EF 60-65%, diast dysfxn, RVSP 50.7 mmHg, mildly dil LA.  Marland Kitchen Anemia   . Arthritis   . Breast cancer ([place]) 2014   right breast cancer  . Breast cancer of upper-inner quadrant of right female breast ([place])    Right breast invasive CA and DCIS , 7 mm T1,N0,M0. Er/PR pos, her 2 negative.  Margins 1 mm.  . CHF (congestive heart failure) ([place])   . CKD (chronic kidney disease), stage III   . Diabetes mellitus without complication ([place])   . Essential hypertension   . Gout   . Hypothyroidism   . Menopause    age 89  . Morbid obesity ([place])   . Persistent atrial fibrillation ([place])    a. Dx 10/2018 in setting of PNA. CHA2DS2VASc = 4-->Eliquis; b. 11/2018 s/p successful DCCV.  Marland Kitchen Personal history of radiation therapy   . Pulmonary embolism ([place]) 10/2014   a. Chronic eliquis.  . Thyroid goiter    Past  Surgical History:  Procedure Laterality Date  . BREAST BIOPSY Right 2014   breast ca  . BREAST EXCISIONAL BIOPSY Right 07/19/2012   breast ca  . BREAST SURGERY Right 2014   with sentinel node bx subareaolar duct excision  . CARDIOVERSION N/A 11/28/2018   Procedure: CARDIOVERSION;  Surgeon: Minna Merritts, MD;  Location: [facility] ORS;  Service: Cardiovascular;  Laterality: N/A;  . COLONOSCOPY WITH PROPOFOL N/A 01/30/2017   Procedure: COLONOSCOPY WITH PROPOFOL;  Surgeon: Christene Lye, MD;  Location: [facility] ENDOSCOPY;  Service: Endoscopy;  Laterality: N/A;   Family History  Problem Relation Age of Onset  . Diabetes Father   . Hypertension Father   . Parkinson's disease Father   . Hypertension Mother   . Rheum arthritis Mother   . Atrial fibrillation Mother   . Heart failure Mother   . Heart attack Mother        died in her mid-70's.  . Colon cancer Cousin 60  . Breast cancer Neg Hx    Social History   Tobacco Use  . Smoking status: Never Smoker  . Smokeless tobacco: Never Used  Substance Use Topics  . Alcohol use: No    Alcohol/week: 0.0 standard drinks   No Known Allergies  Prior to Admission medications   Medication Sig Start Date End Date  Taking? Authorizing Provider  acetaminophen (TYLENOL) 500 MG tablet Take 1,000 mg by mouth every 6 (six) hours as needed (for pain).   Yes [provider]  albuterol (VENTOLIN HFA) 108 (90 Base) MCG/ACT inhaler Inhale 2 puffs into the lungs every 6 (six) hours as needed for wheezing or shortness of breath.   Yes [provider]  amLODipine (NORVASC) 5 MG tablet Take 5 mg by mouth 2 (two) times daily. 09/18/18  Yes [provider]  atorvastatin (LIPITOR) 20 MG tablet Take 20 mg by mouth at bedtime.    Yes [provider]  calcium carbonate (OSCAL) 1500 (600 Ca) MG TABS tablet Take 600 mg of elemental calcium by mouth daily.    Yes [provider]  Cholecalciferol (VITAMIN D3) 50000 units  TABS Take 50,000 Units by mouth every Friday.    Yes [provider]  CINNAMON PO Take 1,000 mg by mouth daily.    Yes [provider]  cloNIDine (CATAPRES) 0.2 MG tablet Take 1 tablet (0.2 mg total) by mouth 2 (two) times daily. 10/21/18  Yes Mayo, Pete Pelt, MD  Dulaglutide (TRULICITY) 1.5 MB/8.4YK SOPN Inject 1.5 mg into the skin every Monday. In the evening   Yes [provider]  ELIQUIS 5 MG TABS tablet TAKE 1 TABLET TWICE A DAY 01/12/19  Yes Corcoran, Drue Second, MD  fluticasone-salmeterol (ADVAIR HFA) 230-21 MCG/ACT inhaler Inhale 2 puffs into the lungs 2 (two) times daily as needed.    Yes [provider]  folic acid (FOLVITE) 599 MCG tablet Take 800 mcg by mouth daily.    Yes [provider]  furosemide (LASIX) 20 MG tablet Take 1 tablet (20 mg) by mouth once daily   Yes [provider]  levothyroxine (SYNTHROID, LEVOTHROID) 112 MCG tablet Take 112 mcg daily before breakfast by mouth.   Yes [provider]  losartan (COZAAR) 25 MG tablet Take 25 mg by mouth daily.   Yes [provider]  metoprolol tartrate (LOPRESSOR) 50 MG tablet TAKE 1 TABLET TWICE A DAY 05/02/19  Yes Wellington Hampshire, MD  nystatin (NYSTATIN) powder Apply 1 g topically 2 (two) times daily as needed (rash (skin folds)).   Yes [provider]  potassium chloride SA (KLOR-CON) 20 MEQ tablet Take 1 tablet (20 mEq total) by mouth daily. Patient taking differently: Take 20 mEq by mouth daily after breakfast.  11/12/18  Yes Darylene Price A, FNP  vitamin C (ASCORBIC ACID) 500 MG tablet Take 500 mg by mouth 2 (two) times daily.   Yes [provider]    Review of Systems  Constitutional: Positive for fatigue. Negative for appetite change.  HENT: Negative for congestion, postnasal drip and sore throat.   Eyes: Negative.   Respiratory: Positive for shortness of breath (with minimal exertion). Negative for cough.   Cardiovascular: Positive for  leg swelling ("little bit"). Negative for chest pain and palpitations.  Gastrointestinal: Negative for abdominal distention and abdominal pain.  Endocrine: Negative.   Genitourinary: Negative.   Musculoskeletal: Positive for arthralgias (feet at times). Negative for back pain.  Skin: Negative.   Allergic/Immunologic: Negative.   Neurological: Negative for dizziness, weakness and light-headedness.  Hematological: Negative for adenopathy. Bruises/bleeds easily.  Psychiatric/Behavioral: Positive for sleep disturbance (adjusting to CPAP). Negative for dysphoric mood. The patient is not nervous/anxious.    Vitals:   07/28/19 1334  BP: (!) 149/62  Pulse: 67  Resp: 20  SpO2: 93%  Weight: (!) 356 lb 4 oz (161.6 kg)  Height: 5\' 3"  (1.6 m)   Wt Readings from Last 3 Encounters:  07/28/19 (!) 356 lb 4 oz (161.6 kg)  05/22/19 (!) 350 lb (158.8 kg)  05/08/19 (!) 345 lb 8 oz (156.7 kg)   Lab Results  Component Value Date   CREATININE 1.57 (H) 04/16/2019   CREATININE 1.45 (H) 01/02/2019   CREATININE 1.76 (H) 12/25/2018    Physical Exam Vitals and nursing note reviewed.  Constitutional:      Appearance: She is well-developed.  HENT:     Head: Normocephalic and atraumatic.  Cardiovascular:     Rate and Rhythm: Normal rate and regular rhythm.  Pulmonary:     Effort: Pulmonary effort is normal.     Breath sounds: Normal breath sounds. No wheezing or rales.  Abdominal:     Palpations: Abdomen is soft.     Tenderness: There is no abdominal tenderness.  Musculoskeletal:     Right lower leg: No tenderness. Edema (trace pitting) present.     Left lower leg: No tenderness. Edema (trace pitting) present.     Comments: (had lymph node removal 2014 in right arm)  Skin:    General: Skin is warm and dry.  Neurological:     General: No focal deficit present.     Mental Status: She is alert and oriented to person, place, and time.  Psychiatric:        Mood and Affect: Mood normal.         Behavior: Behavior normal.   Assessment & Plan:  1: Chronic heart failure with preserved ejection fraction- - NYHA class III - euvolemic today - weighing daily; reminded to weigh every morning, write the weight down and call for an overnight weight gain of >2 pounds or a weekly weight gain of >5 pounds - weight up 17.4 pounds from last visit here 6 months ago; encouraged her to increase her activity - saw cardiology Fletcher Anon) 05/08/19 - not adding salt and has been reading food labels; reviewed the importance of closely following a 2000mg  sodium diet  - BNP 10/30/2018 was 156.0  2: HTN- - BP mildly elevated today but could be related to weight gain - BMP from 05/22/19 reviewed and showed sodium 143, potassium 4.2, creatinine 1.45 and GFR 40 - last saw PCP Ronnald Collum) April 2021 and returns July 2021 - saw nephrology Holley Raring) 05/22/19  3: Atrial fibrillation- - cardioverted October 2020  4: Lymphedema- - stage 2 - trying to elevate her legs when she's sitting for long periods of times - consider lymphapress compression boots if edema persists  5: Obstructive sleep apnea- - saw pulmonology Volanda Napoleon) 05/01/19; sleep study completed 05/22/19 - has been wearing CPAP for ~ the last month and says that he's still getting adjusted to it   Patient did not bring her medications nor a list. Each medication was verbally reviewed with the patient and she was encouraged to bring the bottles to every visit to confirm accuracy of list.   Return in 6 months or sooner for any questions or problems before then.

## 2019-07-28 ENCOUNTER — Ambulatory Visit: Payer: BC Managed Care – PPO | Attending: Family | Admitting: Family

## 2019-07-28 ENCOUNTER — Other Ambulatory Visit: Payer: Self-pay

## 2019-07-28 ENCOUNTER — Encounter: Payer: Self-pay | Admitting: Family

## 2019-07-28 VITALS — BP 149/62 | HR 67 | Resp 20 | Ht 63.0 in | Wt 356.2 lb

## 2019-07-28 DIAGNOSIS — Z8261 Family history of arthritis: Secondary | ICD-10-CM | POA: Insufficient documentation

## 2019-07-28 DIAGNOSIS — I10 Essential (primary) hypertension: Secondary | ICD-10-CM

## 2019-07-28 DIAGNOSIS — Z803 Family history of malignant neoplasm of breast: Secondary | ICD-10-CM | POA: Diagnosis not present

## 2019-07-28 DIAGNOSIS — Z79899 Other long term (current) drug therapy: Secondary | ICD-10-CM | POA: Diagnosis not present

## 2019-07-28 DIAGNOSIS — Z86711 Personal history of pulmonary embolism: Secondary | ICD-10-CM | POA: Insufficient documentation

## 2019-07-28 DIAGNOSIS — Z8249 Family history of ischemic heart disease and other diseases of the circulatory system: Secondary | ICD-10-CM | POA: Insufficient documentation

## 2019-07-28 DIAGNOSIS — Z7901 Long term (current) use of anticoagulants: Secondary | ICD-10-CM | POA: Insufficient documentation

## 2019-07-28 DIAGNOSIS — Z833 Family history of diabetes mellitus: Secondary | ICD-10-CM | POA: Diagnosis not present

## 2019-07-28 DIAGNOSIS — M1990 Unspecified osteoarthritis, unspecified site: Secondary | ICD-10-CM | POA: Diagnosis not present

## 2019-07-28 DIAGNOSIS — I130 Hypertensive heart and chronic kidney disease with heart failure and stage 1 through stage 4 chronic kidney disease, or unspecified chronic kidney disease: Secondary | ICD-10-CM | POA: Insufficient documentation

## 2019-07-28 DIAGNOSIS — Z853 Personal history of malignant neoplasm of breast: Secondary | ICD-10-CM | POA: Diagnosis not present

## 2019-07-28 DIAGNOSIS — N183 Chronic kidney disease, stage 3 unspecified: Secondary | ICD-10-CM | POA: Diagnosis not present

## 2019-07-28 DIAGNOSIS — I5032 Chronic diastolic (congestive) heart failure: Secondary | ICD-10-CM | POA: Insufficient documentation

## 2019-07-28 DIAGNOSIS — Z7951 Long term (current) use of inhaled steroids: Secondary | ICD-10-CM | POA: Diagnosis not present

## 2019-07-28 DIAGNOSIS — I890 Lymphedema, not elsewhere classified: Secondary | ICD-10-CM | POA: Diagnosis not present

## 2019-07-28 DIAGNOSIS — G4730 Sleep apnea, unspecified: Secondary | ICD-10-CM

## 2019-07-28 DIAGNOSIS — G4733 Obstructive sleep apnea (adult) (pediatric): Secondary | ICD-10-CM | POA: Insufficient documentation

## 2019-07-28 DIAGNOSIS — E1122 Type 2 diabetes mellitus with diabetic chronic kidney disease: Secondary | ICD-10-CM | POA: Diagnosis not present

## 2019-07-28 DIAGNOSIS — E039 Hypothyroidism, unspecified: Secondary | ICD-10-CM | POA: Diagnosis not present

## 2019-07-28 DIAGNOSIS — I4819 Other persistent atrial fibrillation: Secondary | ICD-10-CM

## 2019-07-28 NOTE — Patient Instructions (Signed)
Continue weighing daily and call for an overnight weight gain of > 2 pounds or a weekly weight gain of >5 pounds. 

## 2019-08-02 ENCOUNTER — Other Ambulatory Visit: Payer: Self-pay | Admitting: Cardiovascular Disease

## 2019-08-04 ENCOUNTER — Ambulatory Visit: Payer: BC Managed Care – PPO | Admitting: Primary Care

## 2019-08-04 ENCOUNTER — Encounter: Payer: Self-pay | Admitting: Primary Care

## 2019-08-04 ENCOUNTER — Other Ambulatory Visit: Payer: Self-pay

## 2019-08-04 VITALS — BP 138/82 | HR 81 | Temp 98.0°F | Ht 63.0 in | Wt 359.2 lb

## 2019-08-04 DIAGNOSIS — G4733 Obstructive sleep apnea (adult) (pediatric): Secondary | ICD-10-CM | POA: Diagnosis not present

## 2019-08-04 DIAGNOSIS — Z9989 Dependence on other enabling machines and devices: Secondary | ICD-10-CM

## 2019-08-04 NOTE — Assessment & Plan Note (Signed)
-   Patient is 100% compliant with CPAP use, wearing for 3 hours 51 mins - Current pressure setting 5-20cm h20 ; AHI 1.5 - Change CPAP order with DME company to auto titrate 5-20 - Advised patient to continue wearing CPAP for minimum 4 to 6 hours every night not drive if experiencing excessive daytime fatigue or somnolence - Recommend patient separating recliner CHAIR that she sleeps in from living area to help with establishing normal bedtime routine and establish an earlier bedtime by 1-1.5 hours (3am) - Take melatonin 3 to 5 mg every night around midnight - Work on weight loss (goal weight loss 10% of your weight would be 35 lbs) - FU in 6 months

## 2019-08-04 NOTE — Progress Notes (Signed)
@Patient  ID: Nicole Schmidt, female    DOB: 12/31/1965, 54 y.o.   MRN: [id number]  Chief Complaint  Patient presents with  . Follow-up    pt is here for cpap compliance    Referring provider: Lenard Simmer, MD  HPI: 54 year old female.  Past medical history significant for obstructive sleep apnea.  Patient of Dr. Halford Chessman.  Home sleep test in March 2021 showed AHI 17.1, SPO2 low 54%.  Patient had a CPAP titration study with optimal pressure of 15 cm H2O, she did not require oxygen.  08/04/2019 Retired in January 2021. Normal bed time is 4-5am and wake time is 9-10am. She is getting approx 5-6 hours of sleep. She does not have any difficulties fallings asleep. She will wake up no more than once during the night. Sleeps in recliner.   She is not experiencing any issues with mask fit or pressure setting. She has in-lab CPAP titrat study. She did not require oxygen. She was ordered for CPAP 15cm h20. She was having some issues with pressure, felt like she was getting too much pressure. She was changed to auto titrate per [facility] and they were supposed to follow-up with her. Currently on auto titrate 5-20cm h20. She feels this is working better for her.   [facility] download 07/02/2019-07/31/2019 Usage days 30/30 entheses 100%); 40% greater than 4 hours Average hours usage 3 hours 53 minutes Pressure 5-20 cm H2O (7.1 cm H2O-85%) Minimal air leaks AHI 1.5  No Known Allergies  Immunization History  Administered Date(s) Administered  . Influenza-Unspecified 11/05/2018  . PFIZER SARS-COV-2 Vaccination 05/19/2019, 06/16/2019    Past Medical History:  Diagnosis Date  . (HFpEF) heart failure with preserved ejection fraction ([place])    a. 10/2018 Echo: EF 60-65%, diast dysfxn, RVSP 50.7 mmHg, mildly dil LA.  Marland Kitchen Anemia   . Arthritis   . Breast cancer ([place]) 2014   right breast cancer  . Breast cancer of upper-inner quadrant of right female breast ([name])    Right breast invasive CA and DCIS  , 7 mm T1,N0,M0. Er/PR pos, her 2 negative.  Margins 1 mm.  . CHF (congestive heart failure) ([place])   . CKD (chronic kidney disease), stage III   . Diabetes mellitus without complication ([place])   . Essential hypertension   . Gout   . Hypothyroidism   . Menopause    age 62  . Morbid obesity ([place])   . Persistent atrial fibrillation ([place])    a. Dx 10/2018 in setting of PNA. CHA2DS2VASc = 4-->Eliquis; b. 11/2018 s/p successful DCCV.  Marland Kitchen Personal history of radiation therapy   . Pulmonary embolism ([place]) 10/2014   a. Chronic eliquis.  . Thyroid goiter     Tobacco History: Social History   Tobacco Use  Smoking Status Never Smoker  Smokeless Tobacco Never Used   Counseling given: Not Answered   Outpatient Medications Prior to Visit  Medication Sig Dispense Refill  . acetaminophen (TYLENOL) 500 MG tablet Take 1,000 mg by mouth every 6 (six) hours as needed (for pain).    Marland Kitchen albuterol (VENTOLIN HFA) 108 (90 Base) MCG/ACT inhaler Inhale 2 puffs into the lungs every 6 (six) hours as needed for wheezing or shortness of breath.    Marland Kitchen amLODipine (NORVASC) 5 MG tablet Take 5 mg by mouth 2 (two) times daily.    Marland Kitchen atorvastatin (LIPITOR) 20 MG tablet Take 20 mg by mouth at bedtime.     . calcium carbonate (OSCAL) 1500 (600 Ca) MG  TABS tablet Take 600 mg of elemental calcium by mouth daily.     . Cholecalciferol (VITAMIN D3) 50000 units TABS Take 50,000 Units by mouth every Friday.     Marland Kitchen CINNAMON PO Take 1,000 mg by mouth daily.     . cloNIDine (CATAPRES) 0.2 MG tablet Take 1 tablet (0.2 mg total) by mouth 2 (two) times daily. 60 tablet 0  . Dulaglutide (TRULICITY) 1.5 QI/6.9GE SOPN Inject 1.5 mg into the skin every Monday. In the evening    . ELIQUIS 5 MG TABS tablet TAKE 1 TABLET TWICE A DAY 180 tablet 3  . fluticasone-salmeterol (ADVAIR HFA) 230-21 MCG/ACT inhaler Inhale 2 puffs into the lungs 2 (two) times daily as needed.     . folic acid (FOLVITE) 952 MCG tablet Take 800 mcg by mouth daily.       . furosemide (LASIX) 20 MG tablet Take 1 tablet (20 mg) by mouth once daily    . levothyroxine (SYNTHROID, LEVOTHROID) 112 MCG tablet Take 112 mcg daily before breakfast by mouth.    . losartan (COZAAR) 50 MG tablet Take 50 mg by mouth daily.    . metoprolol tartrate (LOPRESSOR) 50 MG tablet TAKE 1 TABLET TWICE A DAY 180 tablet 3  . nystatin (NYSTATIN) powder Apply 1 g topically 2 (two) times daily as needed (rash (skin folds)).    Marland Kitchen potassium chloride SA (KLOR-CON) 20 MEQ tablet Take 1 tablet (20 mEq total) by mouth daily. (Patient taking differently: Take 20 mEq by mouth daily after breakfast. ) 90 tablet 3  . vitamin C (ASCORBIC ACID) 500 MG tablet Take 500 mg by mouth 2 (two) times daily.    Marland Kitchen losartan (COZAAR) 25 MG tablet Take 50 mg by mouth daily.      No facility-administered medications prior to visit.      Review of Systems  Review of Systems  Respiratory: Negative for cough, chest tightness, shortness of breath and wheezing.   Cardiovascular: Negative for leg swelling.  Psychiatric/Behavioral: Positive for sleep disturbance.   Physical Exam  BP 138/82 (BP Location: Left Arm, Cuff Size: Normal)   Pulse 81   Temp 98 F (36.7 C) (Oral)   Ht 5\' 3"  (1.6 m)   Wt (!) 359 lb 3.2 oz (162.9 kg)   LMP 07/17/2012   SpO2 94%   BMI 63.63 kg/m  Physical Exam Constitutional:      General: She is not in acute distress.    Appearance: Normal appearance. She is obese. She is not ill-appearing.  HENT:     Mouth/Throat:     Comments: Mallampati class I-II Cardiovascular:     Rate and Rhythm: Normal rate and regular rhythm.  Pulmonary:     Effort: Pulmonary effort is normal.     Breath sounds: Normal breath sounds.  Neurological:     Mental Status: She is alert.  Psychiatric:        Mood and Affect: Mood normal.        Behavior: Behavior normal.        Thought Content: Thought content normal.        Judgment: Judgment normal.      Lab Results:  CBC    Component Value  Date/Time   WBC 11.1 (H) 04/16/2019 1503   RBC 4.59 04/16/2019 1503   HGB 12.6 04/16/2019 1503   HGB 13.3 12/09/2018 1058   HCT 41.4 04/16/2019 1503   HCT 40.4 12/09/2018 1058   PLT 247 04/16/2019 1503   PLT 208  12/09/2018 1058   MCV 90.2 04/16/2019 1503   MCV 88 12/09/2018 1058   MCV 90 03/09/2014 1533   MCH 27.5 04/16/2019 1503   MCHC 30.4 04/16/2019 1503   RDW 13.5 04/16/2019 1503   RDW 14.4 12/09/2018 1058   RDW 15.2 (H) 03/09/2014 1533   LYMPHSABS 1.9 04/16/2019 1503   LYMPHSABS 1.5 03/09/2014 1533   MONOABS 1.0 04/16/2019 1503   MONOABS 0.6 03/09/2014 1533   EOSABS 0.3 04/16/2019 1503   EOSABS 0.2 03/09/2014 1533   BASOSABS 0.0 04/16/2019 1503   BASOSABS 0.1 03/09/2014 1533    BMET    Component Value Date/Time   NA 139 04/16/2019 1503   NA 141 12/25/2018 1046   NA 141 03/09/2014 1533   K 4.1 04/16/2019 1503   K 4.2 03/09/2014 1533   CL 102 04/16/2019 1503   CL 101 03/09/2014 1533   CO2 26 04/16/2019 1503   CO2 30 03/09/2014 1533   GLUCOSE 101 (H) 04/16/2019 1503   GLUCOSE 95 03/09/2014 1533   BUN 35 (H) 04/16/2019 1503   BUN 35 (H) 12/25/2018 1046   BUN 35 (H) 03/09/2014 1533   CREATININE 1.57 (H) 04/16/2019 1503   CREATININE 1.50 (H) 03/09/2014 1533   CALCIUM 9.0 04/16/2019 1503   CALCIUM 8.8 03/09/2014 1533   GFRNONAA 37 (L) 04/16/2019 1503   GFRNONAA 39 (L) 03/09/2014 1533   GFRNONAA 55 (L) 07/31/2013 1521   GFRAA 43 (L) 04/16/2019 1503   GFRAA 47 (L) 03/09/2014 1533   GFRAA >60 07/31/2013 1521    BNP    Component Value Date/Time   BNP 156.0 (H) 10/30/2018 1143    ProBNP No results found for: PROBNP  Imaging: No results found.   Assessment & Plan:   OSA on CPAP - Patient is 100% compliant with CPAP use, wearing for 3 hours 51 mins - Current pressure setting 5-20cm h20 ; AHI 1.5 - Change CPAP order with DME company to auto titrate 5-20 - Advised patient to continue wearing CPAP for minimum 4 to 6 hours every night not drive if  experiencing excessive daytime fatigue or somnolence - Recommend patient separating recliner CHAIR that she sleeps in from living area to help with establishing normal bedtime routine and establish an earlier bedtime by 1-1.5 hours (3am) - Take melatonin 3 to 5 mg every night around midnight - Work on weight loss (goal weight loss 10% of your weight would be 35 lbs) - FU in 6 months      Martyn Ehrich, NP 08/04/2019

## 2019-08-04 NOTE — Patient Instructions (Addendum)
Pleasure meeting you Nicole Schmidt  Recommendations: Continue wearing CPAP for minimum 4 to 6 hours every night Do not drive if experiencing excessive daytime fatigue or somnolence Recommend separating recliner from your living area to help with establishing normal bedtime routine Recommend you push up bedtime an hour to an hour and a half earlier Take melatonin 3 to 5 mg every night around midnight Work on weight loss, goal weight loss 10% of your weight would be 35 lbs   Orders: Please place order for CPAP 5 to 20 cm H2O with [facility]  Follow-up: 6 months with APP or Dr. Halford Chessman    CPAP and BPAP Information CPAP and BPAP are methods of helping a person breathe with the use of air pressure. CPAP stands for "continuous positive airway pressure." BPAP stands for "bi-level positive airway pressure." In both methods, air is blown through your nose or mouth and into your air passages to help you breathe well. CPAP and BPAP use different amounts of pressure to blow air. With CPAP, the amount of pressure stays the same while you breathe in and out. With BPAP, the amount of pressure is increased when you breathe in (inhale) so that you can take larger breaths. Your health care provider will recommend whether CPAP or BPAP would be more helpful for you. Why are CPAP and BPAP treatments used? CPAP or BPAP can be helpful if you have:  Sleep apnea.  Chronic obstructive pulmonary disease (COPD).  Heart failure.  Medical conditions that weaken the muscles of the chest including muscular dystrophy, or neurological diseases such as amyotrophic lateral sclerosis (ALS).  Other problems that cause breathing to be weak, abnormal, or difficult. CPAP is most commonly used for obstructive sleep apnea (OSA) to keep the airways from collapsing when the muscles relax during sleep. How is CPAP or BPAP administered? Both CPAP and BPAP are provided by a small machine with a flexible plastic tube that attaches  to a plastic mask. You wear the mask. Air is blown through the mask into your nose or mouth. The amount of pressure that is used to blow the air can be adjusted on the machine. Your health care provider will determine the pressure setting that should be used based on your individual needs. When should CPAP or BPAP be used? In most cases, the mask only needs to be worn during sleep. Generally, the mask needs to be worn throughout the night and during any daytime naps. People with certain medical conditions may also need to wear the mask at other times when they are awake. Follow instructions from your health care provider about when to use the machine. What are some tips for using the mask?   Because the mask needs to be snug, some people feel trapped or closed-in (claustrophobic) when first using the mask. If you feel this way, you may need to get used to the mask. One way to do this is by holding the mask loosely over your nose or mouth and then gradually applying the mask more snugly. You can also gradually increase the amount of time that you use the mask.  Masks are available in various types and sizes. Some fit over your mouth and nose while others fit over just your nose. If your mask does not fit well, talk with your health care provider about getting a different one.  If you are using a mask that fits over your nose and you tend to breathe through your mouth, a chin strap may  be applied to help keep your mouth closed.  The CPAP and BPAP machines have alarms that may sound if the mask comes off or develops a leak.  If you have trouble with the mask, it is very important that you talk with your health care provider about finding a way to make the mask easier to tolerate. Do not stop using the mask. Stopping the use of the mask could have a negative impact on your health. What are some tips for using the machine?  Place your CPAP or BPAP machine on a secure table or stand near an electrical  outlet.  Know where the on/off switch is located on the machine.  Follow instructions from your health care provider about how to set the pressure on your machine and when you should use it.  Do not eat or drink while the CPAP or BPAP machine is on. Food or fluids could get pushed into your lungs by the pressure of the CPAP or BPAP.  Do not smoke. Tobacco smoke residue can damage the machine.  For home use, CPAP and BPAP machines can be rented or purchased through home health care companies. Many different brands of machines are available. Renting a machine before purchasing may help you find out which particular machine works well for you.  Keep the CPAP or BPAP machine and attachments clean. Ask your health care provider for specific instructions. Get help right away if:  You have redness or open areas around your nose or mouth where the mask fits.  You have trouble using the CPAP or BPAP machine.  You cannot tolerate wearing the CPAP or BPAP mask.  You have pain, discomfort, and bloating in your abdomen. Summary  CPAP and BPAP are methods of helping a person breathe with the use of air pressure.  Both CPAP and BPAP are provided by a small machine with a flexible plastic tube that attaches to a plastic mask.  If you have trouble with the mask, it is very important that you talk with your health care provider about finding a way to make the mask easier to tolerate. This information is not intended to replace advice given to you by your health care provider. Make sure you discuss any questions you have with your health care provider. Document Revised: 05/22/2018 Document Reviewed: 12/20/2015 Elsevier Patient Education  2020 [employer] Calories are units of energy. Your body needs a certain amount of calories from food to keep you going throughout the day. When you eat more calories than your body needs, your body stores the extra calories as  fat. When you eat fewer calories than your body needs, your body burns fat to get the energy it needs. Calorie counting means keeping track of how many calories you eat and drink each day. Calorie counting can be helpful if you need to lose weight. If you make sure to eat fewer calories than your body needs, you should lose weight. Ask your health care provider what a healthy weight is for you. For calorie counting to work, you will need to eat the right number of calories in a day in order to lose a healthy amount of weight per week. A dietitian can help you determine how many calories you need in a day and will give you suggestions on how to reach your calorie goal.  A healthy amount of weight to lose per week is usually 1-2 lb (0.5-0.9 kg). This usually means that  your daily calorie intake should be reduced by 500-750 calories.  Eating 1,200 - 1,500 calories per day can help most women lose weight.  Eating 1,500 - 1,800 calories per day can help most men lose weight.  What do I need to know about calorie counting? In order to meet your daily calorie goal, you will need to:  Find out how many calories are in each food you would like to eat. Try to do this before you eat.  Decide how much of the food you plan to eat.  Write down what you ate and how many calories it had. Doing this is called keeping a food log. To successfully lose weight, it is important to balance calorie counting with a healthy lifestyle that includes regular activity. Aim for 150 minutes of moderate exercise (such as walking) or 75 minutes of vigorous exercise (such as running) each week. Where do I find calorie information?  The number of calories in a food can be found on a Nutrition Facts label. If a food does not have a Nutrition Facts label, try to look up the calories online or ask your dietitian for help. Remember that calories are listed per serving. If you choose to have more than one serving of a food, you will  have to multiply the calories per serving by the amount of servings you plan to eat. For example, the label on a package of bread might say that a serving size is 1 slice and that there are 90 calories in a serving. If you eat 1 slice, you will have eaten 90 calories. If you eat 2 slices, you will have eaten 180 calories. How do I keep a food log? Immediately after each meal, record the following information in your food log:  What you ate. Don't forget to include toppings, sauces, and other extras on the food.  How much you ate. This can be measured in cups, ounces, or number of items.  How many calories each food and drink had.  The total number of calories in the meal. Keep your food log near you, such as in a small notebook in your pocket, or use a mobile app or website. Some programs will calculate calories for you and show you how many calories you have left for the day to meet your goal. What are some calorie counting tips?   Use your calories on foods and drinks that will fill you up and not leave you hungry: ? Some examples of foods that fill you up are nuts and nut butters, vegetables, lean proteins, and high-fiber foods like whole grains. High-fiber foods are foods with more than 5 g fiber per serving. ? Drinks such as sodas, specialty coffee drinks, alcohol, and juices have a lot of calories, yet do not fill you up.  Eat nutritious foods and avoid empty calories. Empty calories are calories you get from foods or beverages that do not have many vitamins or protein, such as candy, sweets, and soda. It is better to have a nutritious high-calorie food (such as an avocado) than a food with few nutrients (such as a bag of chips).  Know how many calories are in the foods you eat most often. This will help you calculate calorie counts faster.  Pay attention to calories in drinks. Low-calorie drinks include water and unsweetened drinks.  Pay attention to nutrition labels for "low fat" or  "fat free" foods. These foods sometimes have the same amount of calories or more calories than  the full fat versions. They also often have added sugar, starch, or salt, to make up for flavor that was removed with the fat.  Find a way of tracking calories that works for you. Get creative. Try different apps or programs if writing down calories does not work for you. What are some portion control tips?  Know how many calories are in a serving. This will help you know how many servings of a certain food you can have.  Use a measuring cup to measure serving sizes. You could also try weighing out portions on a kitchen scale. With time, you will be able to estimate serving sizes for some foods.  Take some time to put servings of different foods on your favorite plates, bowls, and cups so you know what a serving looks like.  Try not to eat straight from a bag or box. Doing this can lead to overeating. Put the amount you would like to eat in a cup or on a plate to make sure you are eating the right portion.  Use smaller plates, glasses, and bowls to prevent overeating.  Try not to multitask (for example, watch TV or use your computer) while eating. If it is time to eat, sit down at a table and enjoy your food. This will help you to know when you are full. It will also help you to be aware of what you are eating and how much you are eating. What are tips for following this plan? Reading food labels  Check the calorie count compared to the serving size. The serving size may be smaller than what you are used to eating.  Check the source of the calories. Make sure the food you are eating is high in vitamins and protein and low in saturated and trans fats. Shopping  Read nutrition labels while you shop. This will help you make healthy decisions before you decide to purchase your food.  Make a grocery list and stick to it. Cooking  Try to cook your favorite foods in a healthier way. For example, try  baking instead of frying.  Use low-fat dairy products. Meal planning  Use more fruits and vegetables. Half of your plate should be fruits and vegetables.  Include lean proteins like poultry and fish. How do I count calories when eating out?  Ask for smaller portion sizes.  Consider sharing an entree and sides instead of getting your own entree.  If you get your own entree, eat only half. Ask for a box at the beginning of your meal and put the rest of your entree in it so you are not tempted to eat it.  If calories are listed on the menu, choose the lower calorie options.  Choose dishes that include vegetables, fruits, whole grains, low-fat dairy products, and lean protein.  Choose items that are boiled, broiled, grilled, or steamed. Stay away from items that are buttered, battered, fried, or served with cream sauce. Items labeled "crispy" are usually fried, unless stated otherwise.  Choose water, low-fat milk, unsweetened iced tea, or other drinks without added sugar. If you want an alcoholic beverage, choose a lower calorie option such as a glass of wine or light beer.  Ask for dressings, sauces, and syrups on the side. These are usually high in calories, so you should limit the amount you eat.  If you want a salad, choose a garden salad and ask for grilled meats. Avoid extra toppings like bacon, cheese, or fried items. Ask for the  dressing on the side, or ask for olive oil and vinegar or lemon to use as dressing.  Estimate how many servings of a food you are given. For example, a serving of cooked rice is  cup or about the size of half a baseball. Knowing serving sizes will help you be aware of how much food you are eating at restaurants. The list below tells you how big or small some common portion sizes are based on everyday objects: ? 1 oz--4 stacked dice. ? 3 oz--1 deck of cards. ? 1 tsp--1 die. ? 1 Tbsp-- a ping-pong ball. ? 2 Tbsp--1 ping-pong ball. ?  cup--  baseball. ? 1 cup--1 baseball. Summary  Calorie counting means keeping track of how many calories you eat and drink each day. If you eat fewer calories than your body needs, you should lose weight.  A healthy amount of weight to lose per week is usually 1-2 lb (0.5-0.9 kg). This usually means reducing your daily calorie intake by 500-750 calories.  The number of calories in a food can be found on a Nutrition Facts label. If a food does not have a Nutrition Facts label, try to look up the calories online or ask your dietitian for help.  Use your calories on foods and drinks that will fill you up, and not on foods and drinks that will leave you hungry.  Use smaller plates, glasses, and bowls to prevent overeating. This information is not intended to replace advice given to you by your health care provider. Make sure you discuss any questions you have with your health care provider. Document Revised: 10/19/2017 Document Reviewed: 12/31/2015 Elsevier Patient Education  [place].

## 2019-08-11 NOTE — Progress Notes (Signed)
Reviewed and agree with assessment/plan.   Chesley Mires, MD [facility] Pulmonary/Critical Care 08/11/2019, 7:06 AM Pager:  [phone]

## 2019-09-16 DIAGNOSIS — G4733 Obstructive sleep apnea (adult) (pediatric): Secondary | ICD-10-CM | POA: Diagnosis not present

## 2019-09-25 DIAGNOSIS — E1122 Type 2 diabetes mellitus with diabetic chronic kidney disease: Secondary | ICD-10-CM | POA: Diagnosis not present

## 2019-09-25 DIAGNOSIS — R809 Proteinuria, unspecified: Secondary | ICD-10-CM | POA: Diagnosis not present

## 2019-09-25 DIAGNOSIS — R600 Localized edema: Secondary | ICD-10-CM | POA: Diagnosis not present

## 2019-09-25 DIAGNOSIS — N1832 Chronic kidney disease, stage 3b: Secondary | ICD-10-CM | POA: Diagnosis not present

## 2019-09-30 ENCOUNTER — Ambulatory Visit
Admission: RE | Admit: 2019-09-30 | Discharge: 2019-09-30 | Disposition: A | Discharge status: Home / Self Care | Payer: BC Managed Care – PPO | Source: Ambulatory Visit | Attending: Family Medicine | Admitting: Family Medicine

## 2019-09-30 ENCOUNTER — Other Ambulatory Visit: Payer: Self-pay

## 2019-09-30 VITALS — BP 131/71 | HR 76 | Temp 98.0°F | Resp 20 | Wt 357.1 lb

## 2019-09-30 DIAGNOSIS — R1032 Left lower quadrant pain: Secondary | ICD-10-CM | POA: Diagnosis not present

## 2019-09-30 DIAGNOSIS — S31109A Unspecified open wound of abdominal wall, unspecified quadrant without penetration into peritoneal cavity, initial encounter: Secondary | ICD-10-CM | POA: Diagnosis not present

## 2019-09-30 MED ORDER — DOXYCYCLINE HYCLATE 100 MG PO CAPS
100.0000 mg | ORAL_CAPSULE | Freq: Two times a day (BID) | ORAL | 0 refills | Status: DC
Start: 2019-09-30 — End: 2019-11-04

## 2019-09-30 NOTE — Discharge Instructions (Addendum)
I have sent in doxycycline for you to take twice a day for 7 days  Follow up with this office or with primary care for increased swelling, redness, tenderness, warmth, drainage from the area.  Follow up in the ER for red streaking up your abdomen, high fever, trouble swallowing, trouble breathing, other concerning symptoms.

## 2019-09-30 NOTE — ED Triage Notes (Signed)
Pt is here with a abscess on her inner left thigh that started 2 weeks ago, pt has not used anything to relieve discomfort.

## 2019-09-30 NOTE — ED Provider Notes (Addendum)
[place]   [id number] 09/30/19 Arrival Time: 4034  CC: RASH  SUBJECTIVE:  CHARI PARMENTER is a 54 y.o. female who presents with a skin complaint that began 2 weeks ago. Reports that she noticed some blood that she thinks came from her left groin. Reports pain with palpation and certain positions. Denies precipitating event or trauma.  Denies changes in soaps, detergents, close contacts with similar rash, known trigger or environmental trigger, allergy. Denies medications change or starting a new medication recently. Has tried neosporin without relief. Has been using for 2 days. There are no aggravating or alleviating factors. Denies similar symptoms in the past. Denies fever, chills, nausea, vomiting, erythema, swelling, discharge, oral lesions, SOB, chest pain, abdominal pain, changes in bowel or bladder function.    ROS: As per HPI.  All other pertinent ROS negative.     Past Medical History:  Diagnosis Date  . (HFpEF) heart failure with preserved ejection fraction ([place])    a. 10/2018 Echo: EF 60-65%, diast dysfxn, RVSP 50.7 mmHg, mildly dil LA.  Marland Kitchen Anemia   . Arthritis   . Breast cancer ([place]) 2014   right breast cancer  . Breast cancer of upper-inner quadrant of right female breast ([place])    Right breast invasive CA and DCIS , 7 mm T1,N0,M0. Er/PR pos, her 2 negative.  Margins 1 mm.  . CHF (congestive heart failure) ([place])   . CKD (chronic kidney disease), stage III   . Diabetes mellitus without complication ([place])   . Essential hypertension   . Gout   . Hypothyroidism   . Menopause    age 85  . Morbid obesity ([place])   . Persistent atrial fibrillation ([place])    a. Dx 10/2018 in setting of PNA. CHA2DS2VASc = 4-->Eliquis; b. 11/2018 s/p successful DCCV.  Marland Kitchen Personal history of radiation therapy   . Pulmonary embolism ([place]) 10/2014   a. Chronic eliquis.  . Thyroid goiter    Past Surgical History:  Procedure Laterality Date  . BREAST BIOPSY Right 2014   breast ca  .  BREAST EXCISIONAL BIOPSY Right 07/19/2012   breast ca  . BREAST SURGERY Right 2014   with sentinel node bx subareaolar duct excision  . CARDIOVERSION N/A 11/28/2018   Procedure: CARDIOVERSION;  Surgeon: Minna Merritts, MD;  Location: [facility] ORS;  Service: Cardiovascular;  Laterality: N/A;  . COLONOSCOPY WITH PROPOFOL N/A 01/30/2017   Procedure: COLONOSCOPY WITH PROPOFOL;  Surgeon: Christene Lye, MD;  Location: [facility] ENDOSCOPY;  Service: Endoscopy;  Laterality: N/A;   No Known Allergies No current facility-administered medications on file prior to encounter.   Current Outpatient Medications on File Prior to Encounter  Medication Sig Dispense Refill  . acetaminophen (TYLENOL) 500 MG tablet Take 1,000 mg by mouth every 6 (six) hours as needed (for pain).    Marland Kitchen albuterol (VENTOLIN HFA) 108 (90 Base) MCG/ACT inhaler Inhale 2 puffs into the lungs every 6 (six) hours as needed for wheezing or shortness of breath.    Marland Kitchen amLODipine (NORVASC) 5 MG tablet Take 5 mg by mouth 2 (two) times daily.    Marland Kitchen atorvastatin (LIPITOR) 20 MG tablet Take 20 mg by mouth at bedtime.     . calcium carbonate (OSCAL) 1500 (600 Ca) MG TABS tablet Take 600 mg of elemental calcium by mouth daily.     . Cholecalciferol (VITAMIN D3) 50000 units TABS Take 50,000 Units by mouth every Friday.     Marland Kitchen CINNAMON PO Take 1,000 mg by mouth  daily.     . cloNIDine (CATAPRES) 0.2 MG tablet Take 1 tablet (0.2 mg total) by mouth 2 (two) times daily. 60 tablet 0  . Dulaglutide (TRULICITY) 1.5 OE/7.0JJ SOPN Inject 1.5 mg into the skin every Monday. In the evening    . ELIQUIS 5 MG TABS tablet TAKE 1 TABLET TWICE A DAY 180 tablet 3  . fluticasone-salmeterol (ADVAIR HFA) 230-21 MCG/ACT inhaler Inhale 2 puffs into the lungs 2 (two) times daily as needed.     . folic acid (FOLVITE) 009 MCG tablet Take 800 mcg by mouth daily.     . furosemide (LASIX) 20 MG tablet Take 1 tablet (20 mg) by mouth once daily    . levothyroxine (SYNTHROID,  LEVOTHROID) 112 MCG tablet Take 112 mcg daily before breakfast by mouth.    . losartan (COZAAR) 50 MG tablet Take 50 mg by mouth daily.    . metoprolol tartrate (LOPRESSOR) 50 MG tablet TAKE 1 TABLET TWICE A DAY 180 tablet 3  . nystatin (NYSTATIN) powder Apply 1 g topically 2 (two) times daily as needed (rash (skin folds)).    Marland Kitchen potassium chloride SA (KLOR-CON) 20 MEQ tablet Take 1 tablet (20 mEq total) by mouth daily. (Patient taking differently: Take 20 mEq by mouth daily after breakfast. ) 90 tablet 3  . vitamin C (ASCORBIC ACID) 500 MG tablet Take 500 mg by mouth 2 (two) times daily.     Social History   Socioeconomic History  . Marital status: Single    Spouse name: Not on file  . Number of children: Not on file  . Years of education: Not on file  . Highest education level: Not on file  Occupational History  . Not on file  Tobacco Use  . Smoking status: Never Smoker  . Smokeless tobacco: Never Used  Vaping Use  . Vaping Use: Never used  Substance and Sexual Activity  . Alcohol use: No    Alcohol/week: 0.0 standard drinks  . Drug use: No  . Sexual activity: Not Currently    Birth control/protection: None  Other Topics Concern  . Not on file  Social History Narrative   Lives in [place] by herself.  Does not routinely exercise.  Works full-time - on her feet all day at work.   Social Determinants of Health   Financial Resource Strain:   . Difficulty of Paying Living Expenses:   Food Insecurity:   . Worried About Charity fundraiser in the Last Year:   . Arboriculturist in the Last Year:   Transportation Needs:   . Film/video editor (Medical):   Marland Kitchen Lack of Transportation (Non-Medical):   Physical Activity:   . Days of Exercise per Week:   . Minutes of Exercise per Session:   Stress:   . Feeling of Stress :   Social Connections:   . Frequency of Communication with Friends and Family:   . Frequency of Social Gatherings with Friends and Family:   . Attends  Religious Services:   . Active Member of Clubs or Organizations:   . Attends Archivist Meetings:   Marland Kitchen Marital Status:   Intimate Partner Violence:   . Fear of Current or Ex-Partner:   . Emotionally Abused:   Marland Kitchen Physically Abused:   . Sexually Abused:    Family History  Problem Relation Age of Onset  . Diabetes Father   . Hypertension Father   . Parkinson's disease Father   . Hypertension Mother   .  Rheum arthritis Mother   . Atrial fibrillation Mother   . Heart failure Mother   . Heart attack Mother        died in her mid-70's.  . Colon cancer Cousin 45  . Breast cancer Neg Hx     OBJECTIVE: Vitals:   09/30/19 1409  BP: (!) 156/71  Pulse: 63  Resp: 19  Temp: 98.7 F (37.1 C)  TempSrc: Oral  SpO2: 96%    General appearance: alert; no distress Head: NCAT Lungs: clear to auscultation bilaterally Heart: regular rate and rhythm.  Radial pulse 2+ bilaterally Extremities: no edema Skin: warm and dry; 1 cm open area of skin at right groin crease, about 50mm wide and about 2 mm deep. Consistent with abscess that is not healing. Erythematous skin surrounding the area Psychological: alert and cooperative; normal mood and affect  ASSESSMENT & PLAN:  1. Left groin pain   2. Open wound of groin, initial encounter     Meds ordered this encounter  Medications  . doxycycline (VIBRAMYCIN) 100 MG capsule    Sig: Take 1 capsule (100 mg total) by mouth 2 (two) times daily.    Dispense:  14 capsule    Refill:  0    Order Specific Question:   Supervising Provider    Answer:   Chase Picket [id number]    Wound cultured. Will call with abnormal results and treat accordingly May continue antibiotic ointment Prescribed doxycycline Take as prescribed and to completion Keep the area clean and dry Avoid hot showers/ baths Moisturize skin daily  If symptoms are not resolving once antibiotics are completed, follow up with this office or with primary care for wound  closure if needed Follow up with PCP if symptoms persists Return or go to the ER if you have any new or worsening symptoms such as fever, chills, nausea, vomiting, redness, swelling, discharge, if symptoms do not improve with medications  Reviewed expectations re: course of current medical issues. Questions answered. Outlined signs and symptoms indicating need for more acute intervention. Patient verbalized understanding. After Visit Summary given.   Faustino Congress, NP 09/30/19 [place]    Faustino Congress, NP 09/30/19 1529

## 2019-10-03 ENCOUNTER — Telehealth (HOSPITAL_COMMUNITY): Payer: Self-pay | Admitting: Emergency Medicine

## 2019-10-03 LAB — AEROBIC CULTURE W GRAM STAIN (SUPERFICIAL SPECIMEN)

## 2019-10-03 MED ORDER — CEFDINIR 300 MG PO CAPS
300.0000 mg | ORAL_CAPSULE | Freq: Two times a day (BID) | ORAL | 0 refills | Status: AC
Start: 2019-10-03 — End: 2019-10-13

## 2019-10-20 ENCOUNTER — Other Ambulatory Visit: Payer: Self-pay | Admitting: Family

## 2019-10-23 ENCOUNTER — Encounter: Payer: Self-pay | Admitting: Cardiovascular Disease

## 2019-10-23 DIAGNOSIS — R0989 Other specified symptoms and signs involving the circulatory and respiratory systems: Secondary | ICD-10-CM | POA: Diagnosis not present

## 2019-10-23 DIAGNOSIS — I517 Cardiomegaly: Secondary | ICD-10-CM | POA: Diagnosis not present

## 2019-10-23 DIAGNOSIS — R0602 Shortness of breath: Secondary | ICD-10-CM | POA: Diagnosis not present

## 2019-10-23 DIAGNOSIS — I2699 Other pulmonary embolism without acute cor pulmonale: Secondary | ICD-10-CM | POA: Diagnosis not present

## 2019-10-23 DIAGNOSIS — I5032 Chronic diastolic (congestive) heart failure: Secondary | ICD-10-CM | POA: Diagnosis not present

## 2019-11-04 ENCOUNTER — Other Ambulatory Visit: Payer: Self-pay

## 2019-11-04 ENCOUNTER — Ambulatory Visit: Payer: BC Managed Care – PPO | Admitting: Cardiovascular Disease

## 2019-11-04 VITALS — BP 140/60 | HR 64 | Ht 64.0 in | Wt 353.0 lb

## 2019-11-04 DIAGNOSIS — I4819 Other persistent atrial fibrillation: Secondary | ICD-10-CM

## 2019-11-04 DIAGNOSIS — I10 Essential (primary) hypertension: Secondary | ICD-10-CM | POA: Diagnosis not present

## 2019-11-04 DIAGNOSIS — I5032 Chronic diastolic (congestive) heart failure: Secondary | ICD-10-CM | POA: Diagnosis not present

## 2019-11-04 NOTE — Patient Instructions (Signed)
Medication Instructions:  Your physician recommends that you continue on your current medications as directed. Please refer to the Current Medication list given to you today.  *If you need a refill on your cardiac medications before your next appointment, please call your pharmacy*   Lab Work: None ordered If you have labs (blood work) drawn today and your tests are completely normal, you will receive your results only by: . MyChart Message (if you have MyChart) OR . A paper copy in the mail If you have any lab test that is abnormal or we need to change your treatment, we will call you to review the results.   Testing/Procedures: None ordered   Follow-Up: At [facility], you and your health needs are our priority.  As part of our continuing mission to provide you with exceptional heart care, we have created designated Provider Care Teams.  These Care Teams include your primary Cardiologist (physician) and Advanced Practice Providers (APPs -  Physician Assistants and Nurse Practitioners) who all work together to provide you with the care you need, when you need it.  We recommend signing up for the patient portal called "MyChart".  Sign up information is provided on this After Visit Summary.  MyChart is used to connect with patients for Virtual Visits (Telemedicine).  Patients are able to view lab/test results, encounter notes, upcoming appointments, etc.  Non-urgent messages can be sent to your provider as well.   To learn more about what you can do with MyChart, go to [email or web address].    Your next appointment:   12 month(s)  The format for your next appointment:   In Person  Provider:   You may see Muhammad Arida, MD or one of the following Advanced Practice Providers on your designated Care Team:    Christopher Berge, NP  Ryan Dunn, PA-C  Jacquelyn Visser, PA-C  Cadence Furth, PA-C    Other Instructions N/A  

## 2019-11-04 NOTE — Progress Notes (Signed)
Cardiology Office Note   Date:  11/04/2019   ID:  Nicole Schmidt, DOB Jan 11, 1966, MRN [id number]  PCP:  Lenard Simmer, MD  Cardiologist:   Kathlyn Sacramento, MD   Chief Complaint  Patient presents with  . other    6 month f/u no complaints today discuss echo. Meds reviewed verbally with pt.      History of Present Illness: Nicole Schmidt is a 54 y.o. female who presents for for follow-up visit regarding atrial fibrillation and chronic diastolic heart failure. She has known history of breast cancer status post radiation, diabetes mellitus, essential hypertension, chronic kidney disease, thyroid disease, morbid obesity, anemia of chronic disease, gout, pulmonary embolism and chronic diastolic heart failure. She was hospitalized in September 2020 with A. fib with RVR.  She was diagnosed with community-acquired pneumonia which was treated with antibiotics.  She was started on metoprolol 50 mg twice daily for atrial fibrillation.  She was already on Eliquis for previous pulmonary embolism and this was continued.  Echocardiogram showed normal LV systolic function with diastolic dysfunction and moderate pulmonary hypertension.  She underwent successful cardioversion in October, 2020.  No recurrent atrial fibrillation since then.    Sleep testing confirmed sleep apnea and she was started on CPAP.  She is not tolerating CPAP extremely well but tries to use it every night.  She has been doing well with no recent chest pain or worsening dyspnea.  She had an echocardiogram done with Dr. Ronnald Collum which showed normal ejection fraction with no significant valvular abnormalities.    Past Medical History:  Diagnosis Date  . (HFpEF) heart failure with preserved ejection fraction ([place])    a. 10/2018 Echo: EF 60-65%, diast dysfxn, RVSP 50.7 mmHg, mildly dil LA.  Marland Kitchen Anemia   . Arthritis   . Breast cancer ([place]) 2014   right breast cancer  . Breast cancer of upper-inner quadrant of right female breast  ([place])    Right breast invasive CA and DCIS , 7 mm T1,N0,M0. Er/PR pos, her 2 negative.  Margins 1 mm.  . CHF (congestive heart failure) ([name])   . CKD (chronic kidney disease), stage III   . Diabetes mellitus without complication ([place])   . Essential hypertension   . Gout   . Hypothyroidism   . Menopause    age 37  . Morbid obesity ([place])   . Persistent atrial fibrillation ([place])    a. Dx 10/2018 in setting of PNA. CHA2DS2VASc = 4-->Eliquis; b. 11/2018 s/p successful DCCV.  Marland Kitchen Personal history of radiation therapy   . Pulmonary embolism ([place]) 10/2014   a. Chronic eliquis.  . Thyroid goiter     Past Surgical History:  Procedure Laterality Date  . BREAST BIOPSY Right 2014   breast ca  . BREAST EXCISIONAL BIOPSY Right 07/19/2012   breast ca  . BREAST SURGERY Right 2014   with sentinel node bx subareaolar duct excision  . CARDIOVERSION N/A 11/28/2018   Procedure: CARDIOVERSION;  Surgeon: Minna Merritts, MD;  Location: [facility] ORS;  Service: Cardiovascular;  Laterality: N/A;  . COLONOSCOPY WITH PROPOFOL N/A 01/30/2017   Procedure: COLONOSCOPY WITH PROPOFOL;  Surgeon: Christene Lye, MD;  Location: [facility] ENDOSCOPY;  Service: Endoscopy;  Laterality: N/A;     Current Outpatient Medications  Medication Sig Dispense Refill  . acetaminophen (TYLENOL) 500 MG tablet Take 1,000 mg by mouth every 6 (six) hours as needed (for pain).    Marland Kitchen albuterol (VENTOLIN HFA) 108 (90 Base) MCG/ACT inhaler  Inhale 2 puffs into the lungs every 6 (six) hours as needed for wheezing or shortness of breath.    Marland Kitchen amLODipine (NORVASC) 5 MG tablet Take 5 mg by mouth 2 (two) times daily.    Marland Kitchen atorvastatin (LIPITOR) 20 MG tablet Take 20 mg by mouth at bedtime.     . calcium carbonate (OSCAL) 1500 (600 Ca) MG TABS tablet Take 600 mg of elemental calcium by mouth daily.     . Cholecalciferol (VITAMIN D3) 50000 units TABS Take 50,000 Units by mouth every Friday.     Marland Kitchen CINNAMON PO Take 1,000 mg by mouth daily.     .  cloNIDine (CATAPRES) 0.2 MG tablet Take 1 tablet (0.2 mg total) by mouth 2 (two) times daily. 60 tablet 0  . Dulaglutide (TRULICITY) 1.5 FF/6.3WG SOPN Inject 1.5 mg into the skin every Monday. In the evening    . ELIQUIS 5 MG TABS tablet TAKE 1 TABLET TWICE A DAY 180 tablet 3  . fluticasone-salmeterol (ADVAIR HFA) 230-21 MCG/ACT inhaler Inhale 2 puffs into the lungs 2 (two) times daily as needed.     . folic acid (FOLVITE) 665 MCG tablet Take 800 mcg by mouth daily.     . furosemide (LASIX) 20 MG tablet Take 1 tablet (20 mg) by mouth once daily    . KLOR-CON M20 20 MEQ tablet TAKE 1 TABLET DAILY 90 tablet 3  . levothyroxine (SYNTHROID, LEVOTHROID) 112 MCG tablet Take 112 mcg daily before breakfast by mouth.    . losartan (COZAAR) 50 MG tablet Take 50 mg by mouth daily.    . metoprolol tartrate (LOPRESSOR) 50 MG tablet TAKE 1 TABLET TWICE A DAY 180 tablet 3  . nystatin (NYSTATIN) powder Apply 1 g topically 2 (two) times daily as needed (rash (skin folds)).    . vitamin C (ASCORBIC ACID) 500 MG tablet Take 500 mg by mouth 2 (two) times daily.     No current facility-administered medications for this visit.    Allergies:   Patient has no known allergies.    Social History:  The patient  reports that she has never smoked. She has never used smokeless tobacco. She reports that she does not drink alcohol and does not use drugs.   Family History:  The patient's family history includes Atrial fibrillation in her mother; Colon cancer (age of onset: 36) in her cousin; Diabetes in her father; Heart attack in her mother; Heart failure in her mother; Hypertension in her father and mother; Parkinson's disease in her father; Rheum arthritis in her mother.    ROS:  Please see the history of present illness.   Otherwise, review of systems are positive for none.   All other systems are reviewed and negative.    PHYSICAL EXAM: VS:  BP 140/60 (BP Location: Left Wrist, Patient Position: Sitting, Cuff Size:  Large)   Pulse 64   Ht 5\' 4"  (1.626 m)   Wt (!) 353 lb (160.1 kg)   LMP 07/17/2012   SpO2 97%   BMI 60.59 kg/m  , BMI Body mass index is 60.59 kg/m. GEN: Well nourished, well developed, in no acute distress  HEENT: normal  Neck: no JVD, carotid bruits, or masses Cardiac: RRR; no murmurs, rubs, or gallops, trace edema  Respiratory:  clear to auscultation bilaterally, normal work of breathing GI: soft, nontender, nondistended, + BS MS: no deformity or atrophy  Skin: warm and dry, no rash Neuro:  Strength and sensation are intact Psych: euthymic mood, full affect  EKG:  EKG is ordered today. The ekg ordered today demonstrates normal sinus rhythm with first-degree AV block and no significant ST or T wave changes.   Recent Labs: 04/16/2019: ALT 25; BUN 35; Creatinine, Ser 1.57; Hemoglobin 12.6; Platelets 247; Potassium 4.1; Sodium 139    Lipid Panel No results found for: CHOL, TRIG, HDL, CHOLHDL, VLDL, LDLCALC, LDLDIRECT    Wt Readings from Last 3 Encounters:  11/04/19 (!) 353 lb (160.1 kg)  08/04/19 (!) 359 lb 3.2 oz (162.9 kg)  07/28/19 (!) 356 lb 4 oz (161.6 kg)        No flowsheet data found.    ASSESSMENT AND PLAN:  1. Persistent atrial fibrillation: She continues to be in sinus rhythm.  Continue metoprolol.  She is tolerating anticoagulation with Eliquis. Chads vas score is 4 and currently she is on Eliquis.    2. Chronic diastolic heart failure: This was mostly in the setting of A. fib with RVR and currently she appears to be euvolemic on small dose furosemide.    3.  Essential hypertension: Blood pressure is well controlled on current medications.    4. Morbid obesity: She has not been able to lose much weight.  5. History of pulmonary embolism: On anticoagulation with Eliquis.  6.  Obstructive sleep apnea: On CPAP at night tolerating very well.  She follows with pulmonary.    Disposition:   FU with me in 12 months  Signed,  Kathlyn Sacramento, MD   11/04/2019 4:39 PM    [place]

## 2019-11-19 DIAGNOSIS — G4733 Obstructive sleep apnea (adult) (pediatric): Secondary | ICD-10-CM | POA: Diagnosis not present

## 2019-11-21 ENCOUNTER — Other Ambulatory Visit: Payer: Self-pay

## 2019-11-21 ENCOUNTER — Ambulatory Visit
Admission: RE | Admit: 2019-11-21 | Discharge: 2019-11-21 | Disposition: A | Discharge status: Home / Self Care | Payer: BC Managed Care – PPO | Source: Ambulatory Visit | Attending: Hematology and Oncology | Admitting: Hematology and Oncology

## 2019-11-21 DIAGNOSIS — Z853 Personal history of malignant neoplasm of breast: Secondary | ICD-10-CM | POA: Diagnosis not present

## 2019-11-21 DIAGNOSIS — Z1231 Encounter for screening mammogram for malignant neoplasm of breast: Secondary | ICD-10-CM | POA: Diagnosis not present

## 2019-11-21 DIAGNOSIS — Z170 Estrogen receptor positive status [ER+]: Secondary | ICD-10-CM | POA: Insufficient documentation

## 2019-11-21 DIAGNOSIS — C50911 Malignant neoplasm of unspecified site of right female breast: Secondary | ICD-10-CM

## 2019-11-26 DIAGNOSIS — G4733 Obstructive sleep apnea (adult) (pediatric): Secondary | ICD-10-CM | POA: Diagnosis not present

## 2019-12-09 DIAGNOSIS — E041 Nontoxic single thyroid nodule: Secondary | ICD-10-CM | POA: Diagnosis not present

## 2019-12-09 DIAGNOSIS — E1165 Type 2 diabetes mellitus with hyperglycemia: Secondary | ICD-10-CM | POA: Diagnosis not present

## 2019-12-09 DIAGNOSIS — I10 Essential (primary) hypertension: Secondary | ICD-10-CM | POA: Diagnosis not present

## 2019-12-09 DIAGNOSIS — E785 Hyperlipidemia, unspecified: Secondary | ICD-10-CM | POA: Diagnosis not present

## 2019-12-09 DIAGNOSIS — D352 Benign neoplasm of pituitary gland: Secondary | ICD-10-CM | POA: Diagnosis not present

## 2019-12-09 DIAGNOSIS — E063 Autoimmune thyroiditis: Secondary | ICD-10-CM | POA: Diagnosis not present

## 2019-12-19 ENCOUNTER — Other Ambulatory Visit: Payer: Self-pay | Admitting: Endocrinology

## 2019-12-22 DIAGNOSIS — I2699 Other pulmonary embolism without acute cor pulmonale: Secondary | ICD-10-CM | POA: Diagnosis not present

## 2019-12-22 DIAGNOSIS — D352 Benign neoplasm of pituitary gland: Secondary | ICD-10-CM | POA: Diagnosis not present

## 2019-12-22 DIAGNOSIS — Z7189 Other specified counseling: Secondary | ICD-10-CM | POA: Diagnosis not present

## 2019-12-22 DIAGNOSIS — E039 Hypothyroidism, unspecified: Secondary | ICD-10-CM | POA: Diagnosis not present

## 2019-12-22 DIAGNOSIS — E119 Type 2 diabetes mellitus without complications: Secondary | ICD-10-CM | POA: Diagnosis not present

## 2019-12-22 DIAGNOSIS — I10 Essential (primary) hypertension: Secondary | ICD-10-CM | POA: Diagnosis not present

## 2019-12-22 DIAGNOSIS — E785 Hyperlipidemia, unspecified: Secondary | ICD-10-CM | POA: Diagnosis not present

## 2019-12-22 DIAGNOSIS — E559 Vitamin D deficiency, unspecified: Secondary | ICD-10-CM | POA: Diagnosis not present

## 2019-12-22 DIAGNOSIS — C50819 Malignant neoplasm of overlapping sites of unspecified female breast: Secondary | ICD-10-CM | POA: Diagnosis not present

## 2019-12-23 ENCOUNTER — Other Ambulatory Visit: Payer: Self-pay | Admitting: Hematology and Oncology

## 2019-12-23 DIAGNOSIS — I2782 Chronic pulmonary embolism: Secondary | ICD-10-CM

## 2019-12-29 NOTE — Progress Notes (Signed)
[facility]  [address], [address] [place], [place] [address] Phone: [phone]  Fax: [phone]   Clinic Day:  12/30/2019  Referring physician: Lenard Simmer, MD  Chief Complaint: Nicole Schmidt is a 54 y.o. female with stage I breast cancer and a history of submassive pulmonary embolism who is seen for 8 month assessment and review of interval  mammogram.  HPI: The patient was last seen in the medical oncology clinic on 04/16/2019. At that time, she was doing well.  Exam revealed no evidence of recurrent disease.  Area under breast remained moist. Hematocrit was 41.4, hemoglobin 12.6, platelets 247,000, WBC 11,100 (ANC 7,800). Creatinine was 1.57 (CrCl 37 ml/min). Alkaline phosphatase was 168 (38-126). CA27.29 was 27.2. She continued Eliquis. She was off of oral iron.  Screening bilateral mammogram on 11/21/2019 revealed no evidence of malignancy.  The patient saw Dr. Holley Raring on 09/25/2019. Her chronic kidney disease was felt to be stable. She continued Losartan. Follow up was planned for 4 months.  The patient saw Dr. Fletcher Anon on 11/04/2019. She continued to be in sinus rhythm. She continued metoprolol and Eliquis. EF was 64%. Follow up was planned for 12 months.  During the interim, she has been "alright." The moisture underneath her breast has resolved with Nystatin. She performs monthly breast self exams. She reports that she sometimes gets winded but she is able to recover quickly. Her arthritis does not bother her anymore because she is not working. Her thyroid function and diabetes are stable. She remains on Eliquis. She is not taking oral iron.   She had some symptoms recently that resembled pink eye. She took antibiotics and her symptoms went away.  She tries to drink 4 bottles of water per day but does not always reach this goal.   Past Medical History:  Diagnosis Date  . (HFpEF) heart failure with preserved ejection fraction ([place])    a.  10/2018 Echo: EF 60-65%, diast dysfxn, RVSP 50.7 mmHg, mildly dil LA.  Marland Kitchen Anemia   . Arthritis   . Breast cancer ([place]) 2014   right breast cancer  . Breast cancer of upper-inner quadrant of right female breast ([place])    Right breast invasive CA and DCIS , 7 mm T1,N0,M0. Er/PR pos, her 2 negative.  Margins 1 mm.  . CHF (congestive heart failure) ([place])   . CKD (chronic kidney disease), stage III ([place])   . Diabetes mellitus without complication ([place])   . Essential hypertension   . Gout   . Hypothyroidism   . Menopause    age 25  . Morbid obesity ([place])   . Persistent atrial fibrillation ([place])    a. Dx 10/2018 in setting of PNA. CHA2DS2VASc = 4-->Eliquis; b. 11/2018 s/p successful DCCV.  Marland Kitchen Personal history of radiation therapy   . Pulmonary embolism ([place]) 10/2014   a. Chronic eliquis.  . Thyroid goiter     Past Surgical History:  Procedure Laterality Date  . BREAST BIOPSY Right 2014   breast ca  . BREAST EXCISIONAL BIOPSY Right 07/19/2012   breast ca  . BREAST SURGERY Right 2014   with sentinel node bx subareaolar duct excision  . CARDIOVERSION N/A 11/28/2018   Procedure: CARDIOVERSION;  Surgeon: Minna Merritts, MD;  Location: [facility] ORS;  Service: Cardiovascular;  Laterality: N/A;  . COLONOSCOPY WITH PROPOFOL N/A 01/30/2017   Procedure: COLONOSCOPY WITH PROPOFOL;  Surgeon: Christene Lye, MD;  Location: [facility] ENDOSCOPY;  Service: Endoscopy;  Laterality: N/A;  Family History  Problem Relation Age of Onset  . Diabetes Father   . Hypertension Father   . Parkinson's disease Father   . Hypertension Mother   . Rheum arthritis Mother   . Atrial fibrillation Mother   . Heart failure Mother   . Heart attack Mother        died in her mid-70's.  . Colon cancer Cousin 35  . Breast cancer Neg Hx     Social History:  reports that she has never smoked. She has never used smokeless tobacco. She reports that she does not drink alcohol and does not use drugs.  Her mother died of  a MI. Her father died in June 09, 2015. He had Parkinson's disease and dementia. She is an only child. 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 She has never married. The patient is alone today.   Allergies: No Known Allergies  Current Medications: Current Outpatient Medications  Medication Sig Dispense Refill  . acetaminophen (TYLENOL) 500 MG tablet Take 1,000 mg by mouth every 6 (six) hours as needed (for pain).    Marland Kitchen albuterol (VENTOLIN HFA) 108 (90 Base) MCG/ACT inhaler Inhale 2 puffs into the lungs every 6 (six) hours as needed for wheezing or shortness of breath.    Marland Kitchen amLODipine (NORVASC) 5 MG tablet Take 5 mg by mouth 2 (two) times daily.    Marland Kitchen amoxicillin (AMOXIL) 250 MG capsule Take 250 mg by mouth 2 (two) times daily.    Marland Kitchen atorvastatin (LIPITOR) 20 MG tablet Take 20 mg by mouth at bedtime.     . calcium carbonate (OSCAL) 1500 (600 Ca) MG TABS tablet Take 600 mg of elemental calcium by mouth daily.     . Cholecalciferol (VITAMIN D3) 50000 units TABS Take 50,000 Units by mouth every Friday.     Marland Kitchen CINNAMON PO Take 1,000 mg by mouth daily.     . cloNIDine (CATAPRES) 0.2 MG tablet Take 1 tablet (0.2 mg total) by mouth 2 (two) times daily. 60 tablet 0  . Dulaglutide (TRULICITY) 1.5 UU/8.2CM SOPN Inject 1.5 mg into the skin every Monday. In the evening    . ELIQUIS 5 MG TABS tablet TAKE 1 TABLET TWICE A DAY 180 tablet 1  . fluticasone-salmeterol (ADVAIR HFA) 230-21 MCG/ACT inhaler Inhale 2 puffs into the lungs 2 (two) times daily as needed.     . folic acid (FOLVITE) 034 MCG tablet Take 800 mcg by mouth daily.     . furosemide (LASIX) 20 MG tablet Take 1 tablet (20 mg) by mouth once daily    . KLOR-CON M20 20 MEQ tablet TAKE 1 TABLET DAILY 90 tablet 3  . levothyroxine (SYNTHROID, LEVOTHROID) 112 MCG tablet Take 112 mcg daily before breakfast by mouth.    . losartan (COZAAR) 50 MG tablet  Take 50 mg by mouth daily.    . metoprolol tartrate (LOPRESSOR) 50 MG tablet TAKE 1 TABLET TWICE A DAY 180 tablet 3  . nystatin (NYSTATIN) powder Apply 1 g topically 2 (two) times daily as needed (rash (skin folds)).    . vitamin C (ASCORBIC ACID) 500 MG tablet Take 500 mg by mouth 2 (two) times daily.     No current facility-administered medications for this visit.    Review of Systems  Constitutional: Negative for chills, diaphoresis, fever, malaise/fatigue and weight loss (up 13 lbs).       Feels "alright."  HENT: Negative.  Negative for congestion, ear discharge, ear pain, hearing loss, nosebleeds, sinus pain, sore throat and tinnitus.   Eyes: Negative.  Negative for blurred vision, double vision, photophobia and pain.  Respiratory: Positive for shortness of breath (mild, recovers quickly). Negative for cough, hemoptysis and sputum production.   Cardiovascular: Negative for chest pain, palpitations, orthopnea, claudication and leg swelling.  Gastrointestinal: Negative.  Negative for abdominal pain, blood in stool, constipation, diarrhea, heartburn, melena, nausea and vomiting.  Genitourinary: Negative.  Negative for dysuria, frequency, hematuria and urgency.  Musculoskeletal: Negative for back pain, falls, joint pain (arthritis), myalgias and neck pain.  Skin: Negative.  Negative for itching and rash.  Neurological: Negative.  Negative for dizziness, tingling, tremors, sensory change, speech change, focal weakness, weakness and headaches.  Endo/Heme/Allergies: Does not bruise/bleed easily.       Diabetes and HYPOthyroidism.  Psychiatric/Behavioral: Negative.  Negative for depression and memory loss. The patient does not have insomnia.   All other systems reviewed and are negative.  Performance status (ECOG): 1  Vitals Blood pressure (!) 157/54, pulse 64, temperature 98.2 F (36.8 C), temperature source Tympanic, height 5' 4" (1.626 m), weight (!) 360 lb 11.2 oz (163.6 kg), last  menstrual period 07/17/2012, SpO2 100 %.   Physical Exam Vitals and nursing note reviewed.  Constitutional:      General: She is not in acute distress.    Appearance: She is well-developed. She is not diaphoretic.  HENT:     Head: Normocephalic and atraumatic.     Comments: Short dark brown hair.    Mouth/Throat:     Pharynx: No oropharyngeal exudate.  Eyes:     General: No scleral icterus.    Conjunctiva/sclera: Conjunctivae normal.     Pupils: Pupils are equal, round, and reactive to light.     Comments: Blue eyes.  Cardiovascular:     Rate and Rhythm: Normal rate and regular rhythm.     Heart sounds: Normal heart sounds. No murmur heard.   Pulmonary:     Effort: Pulmonary effort is normal. No respiratory distress.     Breath sounds: Normal breath sounds. No wheezing or rales.  Chest:     Chest wall: No tenderness.     Breasts: Breasts are symmetrical.        Right: Swelling (chronic dense edema s/p radiation) and skin change (hyperpigmentation s/p radiation) present. No inverted nipple, mass, nipple discharge or tenderness.        Left: No swelling, inverted nipple, mass, nipple discharge, skin change or tenderness.  Abdominal:     General: Bowel sounds are normal. There is no distension.     Palpations: Abdomen is soft. There is no mass.     Tenderness: There is no abdominal tenderness. There is no guarding or rebound.  Musculoskeletal:        General: No tenderness. Normal range of motion.     Cervical back: Normal range of motion and neck supple.     Comments: Chronic lower extremity changes.  Lymphadenopathy:     Head:     Right side of head: No preauricular, posterior auricular or occipital adenopathy.     Left side of head: No preauricular, posterior auricular or occipital adenopathy.     Cervical: No cervical adenopathy.     Upper Body:     Right upper body: No supraclavicular adenopathy.     Left upper body: No supraclavicular adenopathy.  Skin:    General:  Skin is warm and dry.  Neurological:     Mental Status: She is alert and oriented to person, place, and time.  Psychiatric:        Behavior: Behavior normal.        Thought Content: Thought content normal.        Judgment: Judgment normal.    Appointment on 12/30/2019  Component Date Value Ref Range Status  . WBC 12/30/2019 11.5* 4.0 - 10.5 K/uL Final  . RBC 12/30/2019 4.39  3.87 - 5.11 MIL/uL Final  . Hemoglobin 12/30/2019 11.9* 12.0 - 15.0 g/dL Final  . HCT 12/30/2019 39.2  36 - 46 % Final  . MCV 12/30/2019 89.3  80.0 - 100.0 fL Final  . MCH 12/30/2019 27.1  26.0 - 34.0 pg Final  . MCHC 12/30/2019 30.4  30.0 - 36.0 g/dL Final  . RDW 12/30/2019 14.6  11.5 - 15.5 % Final  . Platelets 12/30/2019 257  150 - 400 K/uL Final  . nRBC 12/30/2019 0.0  0.0 - 0.2 % Final  . Neutrophils Relative % 12/30/2019 71  % Final  . Neutro Abs 12/30/2019 8.2* 1.7 - 7.7 K/uL Final  . Lymphocytes Relative 12/30/2019 17  % Final  . Lymphs Abs 12/30/2019 2.0  0.7 - 4.0 K/uL Final  . Monocytes Relative 12/30/2019 8  % Final  . Monocytes Absolute 12/30/2019 0.9  0.1 - 1.0 K/uL Final  . Eosinophils Relative 12/30/2019 3  % Final  . Eosinophils Absolute 12/30/2019 0.3  0.0 - 0.5 K/uL Final  . Basophils Relative 12/30/2019 0  % Final  . Basophils Absolute 12/30/2019 0.0  0.0 - 0.1 K/uL Final  . Immature Granulocytes 12/30/2019 1  % Final  . Abs Immature Granulocytes 12/30/2019 0.06  0.00 - 0.07 K/uL Final   Performed at [facility], [address] [place], [place] [address]    Assessment:  Nicole Schmidt is a 54 y.o. female with a history of stage I right breast cancerstatus post wide local incision on 08/12/2012. 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 Sentinel lymph node was negative. Tumor was ER/PR positive and Her2/neu negative. Oncotype DXscore was 14 (low risk).  She completedbreast radiationin 10/2012. She began tamoxifenin 10/2012, but discontinued  it on 10/19/2014 following a submassive PE. Shewas onFemarafrom04/27/2017 - late 07/2018.   Bilateral diagnostic mammogramon 09/19/2019revealed no mammographic evidence for malignancy.  Bilateral screening mammogram on 11/20/2018 revealed no mammographic evidence of malignancy.  Screening bilateral mammogram on 11/21/2019 revealed no evidence of malignancy.  CA27.29has been followed: 21.7 on 04/24/2013, 21.4 on 07/31/2013, 17.9 on 03/09/2014, 20.7 on 01/18/2015, 17.2 on 06/10/2015, 22.5 on 08/13/2015, 16.6 on 11/08/2015, 19.8 on 01/31/2016, 25.2 on 05/29/2016, 19.3 on 11/30/2016, 22.1 on 05/14/2017, 29.8 on 08/30/2018, 27.2 on 04/16/2019, and 20.9 on 12/30/2019.  She had a bilateral submassive pulmonary emboli on 10/18/2014. Chest CT angiogram revealed submassive PE with right heart strain. She received catheter directed lytic therapy at [facility]. Lower extremity duplex was negative for DVT. She was on Xarelto (discontinued secondary to oral bleeding). She began Eliquison 07/08/2015.  Hypercoagulable work-uprevealed the following normal studies: Factor V Leiden, prothrombin gene mutation, lupus anticoagulant panel, anticardiolipin antibodies, protein S total (127%), protein S activity (99%), protein C total (71%), protein C activity (77%), and ATIII activity (82%).  Bone density studyon 01/26/2015 was normalwith a T score of -0.3. She has had no menses since 07/2012. Labs on 01/18/2015 confirmed a post-menopausalstatus. She began [facility] 06/10/2015.   BCItesting indicate a 3% (95% CI: 0.8-5.0%)  risk of late recurrence, thus a low likelihood of benefit for extended (5 versus 10 years) adjuvant hormonal therapy.   She was noted to have a normocytic anemiaon 06/10/2015. Labs on 07/08/2015 revealed documented iron deficiency. Ferritin was 25, iron saturation 6%, TIBC 473 (high). Normal labs included B12, folate, SPEP revealed, LFTs, and guaiac cards x 3. She is on  oral ironwith vitamin C.  Colonoscopy on 01/30/2017 was normal.  Ferritin has been followed: 25 on 07/08/2015, 30 on 11/08/2015, 46 on 01/31/2016, 37 on 05/29/2016, 53 on 05/14/2017, 92 on 11/14/2017, and 115 on 08/30/2018.   She was noted to havestage III renal insufficiencyon 06/10/2015. Creatinine was 1.34 (CrCl 45 ml/min) with prior baseline Cr of 0.86 - 0.93. She was seen by nephrology. Etiology was felt secondary to dehydration.  Echocardiogramon 11/20/2017 demonstrated an LVEF of 55-60%. Bone densityon 11/29/2017 that revealed a normal T-score of -1.0 in the LEFT femoral neck.   [facility] (creatinine 4.54) secondary to volume depletion, as patient was on both a loop and thiazide diuretic. Patient underwent gentle IV rehydration. Renal function deteriorated to aBUN 62 and creatine 6.3. She underwent dialysis on 11/28/2017. Discharge labs revealed a BUN of 46 and creatinine of 2.62.  She was admitted to [facility] 03/29/2018 - 04/03/2018.She received Tamiflu for influenza A. She remained hospitalized secondary tohypoxia. She did not go home on oxygen.  Symptomatically, she has been "alright".  She denies any breast concerns.  She remains on Eliquis. She is not taking oral iron.  Exam is stable.  Creatinine is 1.84.  Plan: 1.   Labs today: CBC with diff, CMP, CA27.29. 2.  Stage I RIGHT breast cancer Clinically, she is doing well..  Exam reveals no evidence of recurrent disease. CA27.29 is 20.9 (normal) today. BCI testing revealed a low risk of late recurrence. Shecompleted 5 years of endocrine therapy in late 07/2018.   Screening bilateral mammogram on 11/21/2019 revealed no evidence of malignancy. Continue yearly surveillance. 3.Renal insufficiency Creatinine 1.84 today.   Creatinine1.45 on 01/02/2019 and 1.61 on 09/25/2019. Patient to follow-up with Dr. Holley Raring. 4.Elevated alkaline phosphatase,  resolved Alkaline phosphatase 120 today. Continue to monitor with annual labs. 5.History of BILATERAL pulmonary emboli She remains on chronic anticoagulation. Patient denies any bleeding. Monitor creatinine: safety and efficacy of Eliquis untested if CrCl < 25 ml/min. Continue Eliquis. 6.Bilateral screening mammogram 11/20/2020. 7.   RTC in 1 year for MD assessment, labs (CBC with diff, CMP, CA27.29), and review of mammogram.  I discussed the assessment and treatment plan with the patient.  The patient was provided an opportunity to ask questions and all were answered.  The patient agreed with the plan and demonstrated an understanding of the instructions.  The patient was advised to call back if the symptoms worsen or if the condition fails to improve as anticipated.  I provided 19 minutes of face-to-face time during this this encounter and > 50% was spent counseling as documented under my assessment and plan.  An additional 8 minutes were spent reviewing her chart (Epic and Care Everywhere) including notes, labs, and imaging studies.    Lequita Asal, MD, PhD    12/30/2019, 3:37 PM  I, Mirian Mo Tufford, am acting as Education administrator for [employer]. Mike Gip, MD, PhD.  I, Melissa C. Mike Gip, MD, have reviewed the above documentation for accuracy and completeness, and I agree with the above.

## 2019-12-30 ENCOUNTER — Encounter: Payer: Self-pay | Admitting: Hematology and Oncology

## 2019-12-30 ENCOUNTER — Inpatient Hospital Stay: Payer: BC Managed Care – PPO

## 2019-12-30 ENCOUNTER — Inpatient Hospital Stay: Payer: BC Managed Care – PPO | Attending: Hematology and Oncology | Admitting: Hematology and Oncology

## 2019-12-30 ENCOUNTER — Other Ambulatory Visit: Payer: Self-pay

## 2019-12-30 ENCOUNTER — Telehealth: Payer: Self-pay

## 2019-12-30 VITALS — BP 157/54 | HR 64 | Temp 98.2°F | Ht 64.0 in | Wt 360.7 lb

## 2019-12-30 DIAGNOSIS — I2782 Chronic pulmonary embolism: Secondary | ICD-10-CM | POA: Diagnosis not present

## 2019-12-30 DIAGNOSIS — C50911 Malignant neoplasm of unspecified site of right female breast: Secondary | ICD-10-CM

## 2019-12-30 DIAGNOSIS — N183 Chronic kidney disease, stage 3 unspecified: Secondary | ICD-10-CM | POA: Insufficient documentation

## 2019-12-30 DIAGNOSIS — Z853 Personal history of malignant neoplasm of breast: Secondary | ICD-10-CM | POA: Diagnosis not present

## 2019-12-30 DIAGNOSIS — Z923 Personal history of irradiation: Secondary | ICD-10-CM | POA: Diagnosis not present

## 2019-12-30 DIAGNOSIS — I130 Hypertensive heart and chronic kidney disease with heart failure and stage 1 through stage 4 chronic kidney disease, or unspecified chronic kidney disease: Secondary | ICD-10-CM | POA: Insufficient documentation

## 2019-12-30 DIAGNOSIS — Z86711 Personal history of pulmonary embolism: Secondary | ICD-10-CM | POA: Diagnosis not present

## 2019-12-30 DIAGNOSIS — Z800 Family history of malignant neoplasm of digestive organs: Secondary | ICD-10-CM | POA: Diagnosis not present

## 2019-12-30 DIAGNOSIS — D509 Iron deficiency anemia, unspecified: Secondary | ICD-10-CM

## 2019-12-30 DIAGNOSIS — Z7901 Long term (current) use of anticoagulants: Secondary | ICD-10-CM | POA: Diagnosis not present

## 2019-12-30 DIAGNOSIS — N289 Disorder of kidney and ureter, unspecified: Secondary | ICD-10-CM | POA: Diagnosis not present

## 2019-12-30 DIAGNOSIS — Z170 Estrogen receptor positive status [ER+]: Secondary | ICD-10-CM

## 2019-12-30 DIAGNOSIS — E1122 Type 2 diabetes mellitus with diabetic chronic kidney disease: Secondary | ICD-10-CM | POA: Insufficient documentation

## 2019-12-30 DIAGNOSIS — R748 Abnormal levels of other serum enzymes: Secondary | ICD-10-CM

## 2019-12-30 LAB — COMPREHENSIVE METABOLIC PANEL
ALT: 21 U/L (ref 0–44)
AST: 22 U/L (ref 15–41)
Albumin: 3.7 g/dL (ref 3.5–5.0)
Alkaline Phosphatase: 120 U/L (ref 38–126)
Anion gap: 14 (ref 5–15)
BUN: 42 mg/dL — ABNORMAL HIGH (ref 6–20)
CO2: 26 mmol/L (ref 22–32)
Calcium: 8.9 mg/dL (ref 8.9–10.3)
Chloride: 99 mmol/L (ref 98–111)
Creatinine, Ser: 1.84 mg/dL — ABNORMAL HIGH (ref 0.44–1.00)
GFR, Estimated: 32 mL/min — ABNORMAL LOW (ref >60)
Glucose, Bld: 153 mg/dL — ABNORMAL HIGH (ref 70–99)
Potassium: 4.5 mmol/L (ref 3.5–5.1)
Sodium: 139 mmol/L (ref 135–145)
Total Bilirubin: 0.3 mg/dL (ref 0.3–1.2)
Total Protein: 7.7 g/dL (ref 6.5–8.1)

## 2019-12-30 LAB — CBC WITH DIFFERENTIAL/PLATELET
Abs Immature Granulocytes: 0.06 10*3/uL (ref 0.00–0.07)
Basophils Absolute: 0 10*3/uL (ref 0.0–0.1)
Basophils Relative: 0 %
Eosinophils Absolute: 0.3 10*3/uL (ref 0.0–0.5)
Eosinophils Relative: 3 %
HCT: 39.2 % (ref 36.0–46.0)
Hemoglobin: 11.9 g/dL — ABNORMAL LOW (ref 12.0–15.0)
Immature Granulocytes: 1 %
Lymphocytes Relative: 17 %
Lymphs Abs: 2 10*3/uL (ref 0.7–4.0)
MCH: 27.1 pg (ref 26.0–34.0)
MCHC: 30.4 g/dL (ref 30.0–36.0)
MCV: 89.3 fL (ref 80.0–100.0)
Monocytes Absolute: 0.9 10*3/uL (ref 0.1–1.0)
Monocytes Relative: 8 %
Neutro Abs: 8.2 10*3/uL — ABNORMAL HIGH (ref 1.7–7.7)
Neutrophils Relative %: 71 %
Platelets: 257 10*3/uL (ref 150–400)
RBC: 4.39 MIL/uL (ref 3.87–5.11)
RDW: 14.6 % (ref 11.5–15.5)
WBC: 11.5 10*3/uL — ABNORMAL HIGH (ref 4.0–10.5)
nRBC: 0 % (ref 0.0–0.2)

## 2019-12-30 NOTE — Progress Notes (Signed)
Patient here today for follow up. Denies any pain or concerns at this time. States she recently had transthoracic echocardiogram done  and has results with her if provider would like view.

## 2019-12-30 NOTE — Telephone Encounter (Signed)
Labs sent to dr lateef °

## 2019-12-30 NOTE — Telephone Encounter (Signed)
-----   Message from Lequita Asal, MD sent at 12/30/2019  4:22 PM EST ----- Regarding: Please send labs to Dr Holley Raring.  Creatinine has increased  ----- Message ----- From: Interface, Lab In [place] Sent: 12/30/2019   3:21 PM EST To: Lequita Asal, MD

## 2019-12-31 LAB — CANCER ANTIGEN 27.29: CA 27.29: 20.9 U/mL (ref 0.0–38.6)

## 2020-01-19 ENCOUNTER — Ambulatory Visit
Admission: EM | Admit: 2020-01-19 | Discharge: 2020-01-19 | Disposition: A | Discharge status: Home / Self Care | Payer: BC Managed Care – PPO | Attending: Family Medicine | Admitting: Family Medicine

## 2020-01-19 ENCOUNTER — Encounter: Payer: Self-pay | Admitting: Family Medicine

## 2020-01-19 ENCOUNTER — Other Ambulatory Visit: Payer: Self-pay

## 2020-01-19 DIAGNOSIS — G8929 Other chronic pain: Secondary | ICD-10-CM | POA: Diagnosis not present

## 2020-01-19 DIAGNOSIS — R1032 Left lower quadrant pain: Secondary | ICD-10-CM

## 2020-01-19 MED ORDER — CEFDINIR 300 MG PO CAPS: 300.0000 mg | ORAL_CAPSULE | Freq: Two times a day (BID) | ORAL | 0 refills | Status: AC

## 2020-01-19 NOTE — ED Triage Notes (Signed)
Patient c/o wound in groin x 1 day.   Patent denies any fever, drainage, or pain.  Patent endorses using some antibiotics ointment w/ no relief of symptoms.  Patent said she has a history of wound in the same area that was previously treated at this clinic.

## 2020-01-19 NOTE — Discharge Instructions (Addendum)
Take the antibiotics as prescribed. Recommend follow-up with general surgery for any continued issues. Keep the area clean and dry

## 2020-01-20 NOTE — ED Provider Notes (Signed)
Nicole Schmidt    CSN: [id number] Arrival date & time: 01/19/20  1457      History   Chief Complaint Chief Complaint  Patient presents with  . Skin Problem    HPI Nicole Schmidt is a 54 y.o. female.   Patient is a 54 year old female presents today with recurrent open area to left groin that is draining.  Was seen here previously for this in August.  Noticed this a day ago.  Noticed blood when wiping the area.  Denies any odor from the area.  Has been using antibiotic ointment and A&E ointment for symptoms.  No fever, chills.     Past Medical History:  Diagnosis Date  . (HFpEF) heart failure with preserved ejection fraction ([place])    a. 10/2018 Echo: EF 60-65%, diast dysfxn, RVSP 50.7 mmHg, mildly dil LA.  Marland Kitchen Anemia   . Arthritis   . Breast cancer ([place]) 2014   right breast cancer  . Breast cancer of upper-inner quadrant of right female breast ([place])    Right breast invasive CA and DCIS , 7 mm T1,N0,M0. Er/PR pos, her 2 negative.  Margins 1 mm.  . CHF (congestive heart failure) ([place])   . CKD (chronic kidney disease), stage III ([place])   . Diabetes mellitus without complication ([place])   . Essential hypertension   . Gout   . Hypothyroidism   . Menopause    age 59  . Morbid obesity ([place])   . Persistent atrial fibrillation ([place])    a. Dx 10/2018 in setting of PNA. CHA2DS2VASc = 4-->Eliquis; b. 11/2018 s/p successful DCCV.  Marland Kitchen Personal history of radiation therapy   . Pulmonary embolism ([place]) 10/2014   a. Chronic eliquis.  . Thyroid goiter     Patient Active Problem List   Diagnosis Date Noted  . Chronic anticoagulation 12/30/2019  . OSA on CPAP 08/04/2019  . Atrial fibrillation with RVR ([place]) 10/18/2018  . Acute hypoxemic respiratory failure ([place]) 03/29/2018  . Chronic diastolic heart failure ([place]) 02/04/2018  . Lymphedema 02/04/2018  . HTN (hypertension) 12/09/2017  . Long term (current) use of aromatase inhibitors 11/17/2017  . Elevated alkaline phosphatase  level 05/20/2017  . Goals of care, counseling/discussion 05/14/2017  . Chronic renal disease, stage III ([place]) 07/08/2015  . Iron deficiency anemia 07/08/2015  . Acute renal failure superimposed on stage 3 chronic kidney disease ([place]) 10/22/2014  . History of breast cancer 10/22/2014  . Elevated troponin 10/22/2014  . Pulmonary emboli ([place]) 10/18/2014  . Pulmonary embolism ([place]) 10/18/2014  . Candidiasis of breast 07/06/2014  . Malignant neoplasm of right female breast ([place]) 07/24/2012    Past Surgical History:  Procedure Laterality Date  . BREAST BIOPSY Right 2014   breast ca  . BREAST EXCISIONAL BIOPSY Right 07/19/2012   breast ca  . BREAST SURGERY Right 2014   with sentinel node bx subareaolar duct excision  . CARDIOVERSION N/A 11/28/2018   Procedure: CARDIOVERSION;  Surgeon: Minna Merritts, MD;  Location: [facility] ORS;  Service: Cardiovascular;  Laterality: N/A;  . COLONOSCOPY WITH PROPOFOL N/A 01/30/2017   Procedure: COLONOSCOPY WITH PROPOFOL;  Surgeon: Christene Lye, MD;  Location: [facility] ENDOSCOPY;  Service: Endoscopy;  Laterality: N/A;    OB History    Gravida  0   Para      Term      Preterm      AB      Living        SAB  TAB      Ectopic      Multiple      Live Births           Obstetric Comments  1st Menstrual Cycle:  11         Home Medications    Prior to Admission medications   Medication Sig Start Date End Date Taking? Authorizing Provider  amLODipine (NORVASC) 5 MG tablet Take 5 mg by mouth 2 (two) times daily. 09/18/18  Yes [provider]  atorvastatin (LIPITOR) 20 MG tablet Take 20 mg by mouth at bedtime.    Yes [provider]  calcium carbonate (OSCAL) 1500 (600 Ca) MG TABS tablet Take 600 mg of elemental calcium by mouth daily.    Yes [provider]  Cholecalciferol (VITAMIN D3) 50000 units TABS Take 50,000 Units by mouth every Friday.    Yes [provider]  CINNAMON PO Take 1,000  mg by mouth daily.    Yes [provider]  cloNIDine (CATAPRES) 0.2 MG tablet Take 1 tablet (0.2 mg total) by mouth 2 (two) times daily. 10/21/18  Yes Mayo, Pete Pelt, MD  Dulaglutide (TRULICITY) 1.5 DX/8.3JA SOPN Inject 1.5 mg into the skin every Monday. In the evening   Yes [provider]  ELIQUIS 5 MG TABS tablet TAKE 1 TABLET TWICE A DAY 12/23/19  Yes Corcoran, Drue Second, MD  fluticasone-salmeterol (ADVAIR HFA) 230-21 MCG/ACT inhaler Inhale 2 puffs into the lungs 2 (two) times daily as needed.    Yes [provider]  folic acid (FOLVITE) 250 MCG tablet Take 800 mcg by mouth daily.    Yes [provider]  furosemide (LASIX) 20 MG tablet Take 1 tablet (20 mg) by mouth once daily   Yes [provider]  KLOR-CON M20 20 MEQ tablet TAKE 1 TABLET DAILY 10/20/19  Yes Darylene Price A, FNP  levothyroxine (SYNTHROID, LEVOTHROID) 112 MCG tablet Take 112 mcg daily before breakfast by mouth.   Yes [provider]  losartan (COZAAR) 50 MG tablet Take 50 mg by mouth daily.   Yes [provider]  metoprolol tartrate (LOPRESSOR) 50 MG tablet TAKE 1 TABLET TWICE A DAY 08/04/19  Yes Wellington Hampshire, MD  nystatin (NYSTATIN) powder Apply 1 g topically 2 (two) times daily as needed (rash (skin folds)).   Yes [provider]  vitamin C (ASCORBIC ACID) 500 MG tablet Take 500 mg by mouth 2 (two) times daily.   Yes [provider]  acetaminophen (TYLENOL) 500 MG tablet Take 1,000 mg by mouth every 6 (six) hours as needed (for pain).    [provider]  albuterol (VENTOLIN HFA) 108 (90 Base) MCG/ACT inhaler Inhale 2 puffs into the lungs every 6 (six) hours as needed for wheezing or shortness of breath.    [provider]  cefdinir (OMNICEF) 300 MG capsule Take 1 capsule (300 mg total) by mouth 2 (two) times daily for 10 days. 01/19/20 01/29/20  Orvan July, NP    Family History Family History  Problem Relation Age of Onset   . Diabetes Father   . Hypertension Father   . Parkinson's disease Father   . Hypertension Mother   . Rheum arthritis Mother   . Atrial fibrillation Mother   . Heart failure Mother   . Heart attack Mother        died in her mid-70's.  . Colon cancer Cousin 50  . Breast cancer Neg Hx     Social History  Social History   Tobacco Use  . Smoking status: Never Smoker  . Smokeless tobacco: Never Used  Vaping Use  . Vaping Use: Never used  Substance Use Topics  . Alcohol use: No    Alcohol/week: 0.0 standard drinks  . Drug use: No     Allergies   Patient has no known allergies.   Review of Systems Review of Systems   Physical Exam Triage Vital Signs ED Triage Vitals [01/19/20 1508]  Enc Vitals Group     BP (!) 157/60     Pulse Rate 71     Resp 16     Temp 99.4 F (37.4 C)     Temp Source Oral     SpO2 94 %     Weight      Height      Head Circumference      Peak Flow      Pain Score 0     Pain Loc      Pain Edu?      Excl. in [place]?    No data found.  Updated Vital Signs BP (!) 157/60 (BP Location: Right Arm)   Pulse 71   Temp 99.4 F (37.4 C) (Oral)   Resp 16   LMP 07/17/2012   SpO2 94%   Visual Acuity Right Eye Distance:   Left Eye Distance:   Bilateral Distance:    Right Eye Near:   Left Eye Near:    Bilateral Near:     Physical Exam Vitals and nursing note reviewed.  Constitutional:      General: She is not in acute distress.    Appearance: Normal appearance. She is not ill-appearing, toxic-appearing or diaphoretic.  HENT:     Head: Normocephalic.     Nose: Nose normal.  Eyes:     Conjunctiva/sclera: Conjunctivae normal.  Pulmonary:     Effort: Pulmonary effort is normal.  Abdominal:       Comments: Small whole that is draining bloody/yellow drainage. TTP. No induration or palpable abscess.   Musculoskeletal:        General: Normal range of motion.     Cervical back: Normal range of motion.  Skin:    General: Skin is warm and  dry.     Findings: No rash.  Neurological:     Mental Status: She is alert.  Psychiatric:        Mood and Affect: Mood normal.      UC Treatments / Results  Labs (all labs ordered are listed, but only abnormal results are displayed) Labs Reviewed - No data to display  EKG   Radiology No results found.  Procedures Procedures (including critical care time)  Medications Ordered in UC Medications - No data to display  Initial Impression / Assessment and Plan / UC Course  I have reviewed the triage vital signs and the nursing notes.  Pertinent labs & imaging results that were available during my care of the patient were reviewed by me and considered in my medical decision making (see chart for details).     Groin pain, drainage.  Not sure if this is some sort of chronic fistula versus chronic recurring abscess The area is tender and draining.  We will go ahead and cover for infection with abx at this time.  Recommend she follow-up with general surgery for further evaluation if this returns Final Clinical Impressions(s) / UC Diagnoses   Final diagnoses:  Groin pain, chronic, left     Discharge  Instructions     Take the antibiotics as prescribed. Recommend follow-up with general surgery for any continued issues. Keep the area clean and dry     ED Prescriptions    Medication Sig Dispense Auth. Provider   cefdinir (OMNICEF) 300 MG capsule Take 1 capsule (300 mg total) by mouth 2 (two) times daily for 10 days. 20 capsule Loura Halt A, NP     PDMP not reviewed this encounter.   Orvan July, NP 01/20/20 [phone]

## 2020-01-26 NOTE — Progress Notes (Signed)
Patient ID: EMALINA DUBREUIL, female    DOB: 1965/08/27, 54 y.o.   MRN: [id number]  HPI  Ms Genson is a 54 y/o female with a history of breast cancer, DM, HTN, CKD, thyroid disease, anemia, gout, pulmonary embolus and chronic heart failure.   Echo report from 10/20/2018 reviewed and showed an EF of 60-65% along with an elevated PA pressure of 50.7 mmHg. Echo report from 11/20/17 reviewed and showed an EF of 55-60%.  Went to Urgent Care 01/19/20 due to gout pain where she was evaluated and released.    She presents today for a follow-up visit with a chief complaint of shortness of breath with minimal exertion. She describes this as chronic in nature although does notice that as she's gained weight, her shortness of breath is more noticeable. She does note that she quickly recovers once she sits down for a few minutes. She has associated fatigue, minimal pedal edema, difficulty sleeping and gradual weight gain along with this. She denies any dizziness, abdominal distention, palpitations, chest pain or cough. Intermittently notices a fleeting tingle in her left arm that started after she had some blood work drawn. Doesn't last long and denies any pain when it occurs.   She says that she retired January 2021 and has gradually gained weight as she's not as active as she previously was. She is continuing to clean out a house that she has and will be putting it on the market hopefully at the first of the year.     Past Medical History:  Diagnosis Date  . (HFpEF) heart failure with preserved ejection fraction ([place])    a. 10/2018 Echo: EF 60-65%, diast dysfxn, RVSP 50.7 mmHg, mildly dil LA.  Marland Kitchen Anemia   . Arthritis   . Breast cancer ([place]) 2014   right breast cancer  . Breast cancer of upper-inner quadrant of right female breast ([place])    Right breast invasive CA and DCIS , 7 mm T1,N0,M0. Er/PR pos, her 2 negative.  Margins 1 mm.  . CHF (congestive heart failure) ([place])   . CKD (chronic kidney disease), stage  III ([place])   . Diabetes mellitus without complication ([place])   . Essential hypertension   . Gout   . Hypothyroidism   . Menopause    age 4  . Morbid obesity ([place])   . Persistent atrial fibrillation ([place])    a. Dx 10/2018 in setting of PNA. CHA2DS2VASc = 4-->Eliquis; b. 11/2018 s/p successful DCCV.  Marland Kitchen Personal history of radiation therapy   . Pulmonary embolism ([place]) 10/2014   a. Chronic eliquis.  . Thyroid goiter    Past Surgical History:  Procedure Laterality Date  . BREAST BIOPSY Right 2014   breast ca  . BREAST EXCISIONAL BIOPSY Right 07/19/2012   breast ca  . BREAST SURGERY Right 2014   with sentinel node bx subareaolar duct excision  . CARDIOVERSION N/A 11/28/2018   Procedure: CARDIOVERSION;  Surgeon: Minna Merritts, MD;  Location: [facility] ORS;  Service: Cardiovascular;  Laterality: N/A;  . COLONOSCOPY WITH PROPOFOL N/A 01/30/2017   Procedure: COLONOSCOPY WITH PROPOFOL;  Surgeon: Christene Lye, MD;  Location: [facility] ENDOSCOPY;  Service: Endoscopy;  Laterality: N/A;   Family History  Problem Relation Age of Onset  . Diabetes Father   . Hypertension Father   . Parkinson's disease Father   . Hypertension Mother   . Rheum arthritis Mother   . Atrial fibrillation Mother   . Heart failure Mother   . Heart  attack Mother        died in her mid-70's.  . Colon cancer Cousin 37  . Breast cancer Neg Hx    Social History   Tobacco Use  . Smoking status: Never Smoker  . Smokeless tobacco: Never Used  Substance Use Topics  . Alcohol use: No    Alcohol/week: 0.0 standard drinks   No Known Allergies  Prior to Admission medications   Medication Sig Start Date End Date Taking? Authorizing Provider  acetaminophen (TYLENOL) 500 MG tablet Take 1,000 mg by mouth every 6 (six) hours as needed (for pain).   Yes [provider]  albuterol (VENTOLIN HFA) 108 (90 Base) MCG/ACT inhaler Inhale 2 puffs into the lungs every 6 (six) hours as needed for wheezing or shortness  of breath.   Yes [provider]  amLODipine (NORVASC) 5 MG tablet Take 5 mg by mouth 2 (two) times daily. 09/18/18  Yes [provider]  atorvastatin (LIPITOR) 20 MG tablet Take 20 mg by mouth at bedtime.    Yes [provider]  calcium carbonate (OSCAL) 1500 (600 Ca) MG TABS tablet Take 600 mg of elemental calcium by mouth daily.    Yes [provider]  cefdinir (OMNICEF) 300 MG capsule Take 1 capsule (300 mg total) by mouth 2 (two) times daily for 10 days. 01/19/20 01/29/20 Yes Bast, Traci A, NP  Cholecalciferol (VITAMIN D3) 50000 units TABS Take 50,000 Units by mouth every Friday.    Yes [provider]  CINNAMON PO Take 1,000 mg by mouth daily.    Yes [provider]  cloNIDine (CATAPRES) 0.2 MG tablet Take 1 tablet (0.2 mg total) by mouth 2 (two) times daily. 10/21/18  Yes Mayo, Pete Pelt, MD  Dulaglutide 1.5 MG/0.5ML SOPN Inject 1.5 mg into the skin every Monday. In the evening   Yes [provider]  ELIQUIS 5 MG TABS tablet TAKE 1 TABLET TWICE A DAY 12/23/19  Yes Corcoran, Drue Second, MD  fluticasone-salmeterol (ADVAIR HFA) 230-21 MCG/ACT inhaler Inhale 2 puffs into the lungs 2 (two) times daily as needed.    Yes [provider]  folic acid (FOLVITE) 500 MCG tablet Take 800 mcg by mouth daily.    Yes [provider]  furosemide (LASIX) 20 MG tablet Take 1 tablet (20 mg) by mouth once daily   Yes [provider]  KLOR-CON M20 20 MEQ tablet TAKE 1 TABLET DAILY 10/20/19  Yes Darylene Price A, FNP  levothyroxine (SYNTHROID, LEVOTHROID) 112 MCG tablet Take 112 mcg daily before breakfast by mouth.   Yes [provider]  losartan (COZAAR) 50 MG tablet Take 50 mg by mouth daily.   Yes [provider]  Melatonin 10 MG TABS Take by mouth.   Yes [provider]  metoprolol tartrate (LOPRESSOR) 50 MG tablet TAKE 1 TABLET TWICE A DAY 08/04/19  Yes Wellington Hampshire, MD  nystatin (MYCOSTATIN/NYSTOP)  powder Apply 1 g topically 2 (two) times daily as needed (rash (skin folds)).   Yes [provider]  vitamin C (ASCORBIC ACID) 500 MG tablet Take 500 mg by mouth 2 (two) times daily.   Yes [provider]     Review of Systems  Constitutional: Positive for fatigue. Negative for appetite change.  HENT: Negative for congestion, postnasal drip and sore throat.   Eyes: Negative.   Respiratory: Positive for shortness of breath (with minimal exertion). Negative for cough.   Cardiovascular: Positive for leg swelling (more so last few days  due to not elevating legs as much). Negative for chest pain and palpitations.  Gastrointestinal: Negative for abdominal distention and abdominal pain.  Endocrine: Negative.   Genitourinary: Negative.   Musculoskeletal: Positive for arthralgias (feet at times). Negative for back pain.  Skin: Negative.   Allergic/Immunologic: Negative.   Neurological: Negative for dizziness, weakness and light-headedness.  Hematological: Negative for adenopathy. Bruises/bleeds easily.  Psychiatric/Behavioral: Positive for sleep disturbance (wearing CPAP). Negative for dysphoric mood. The patient is not nervous/anxious.    Vitals:   01/27/20 1337 01/27/20 1354  BP: (!) 174/57 132/60  Pulse: 70   Resp: 20   SpO2: 94%   Weight: (!) 363 lb 6 oz (164.8 kg)   Height: 5\' 4"  (1.626 m)    Wt Readings from Last 3 Encounters:  01/27/20 (!) 363 lb 6 oz (164.8 kg)  12/30/19 (!) 360 lb 11.2 oz (163.6 kg)  11/04/19 (!) 353 lb (160.1 kg)   Lab Results  Component Value Date   CREATININE 1.84 (H) 12/30/2019   CREATININE 1.57 (H) 04/16/2019   CREATININE 1.45 (H) 01/02/2019    Physical Exam Vitals and nursing note reviewed.  Constitutional:      Appearance: She is well-developed.  HENT:     Head: Normocephalic and atraumatic.  Cardiovascular:     Rate and Rhythm: Normal rate and regular rhythm.  Pulmonary:     Effort: Pulmonary effort is normal.     Breath  sounds: Normal breath sounds. No wheezing or rales.  Abdominal:     Palpations: Abdomen is soft.     Tenderness: There is no abdominal tenderness.  Musculoskeletal:     Right lower leg: No tenderness. Edema (trace pitting) present.     Left lower leg: No tenderness. Edema (trace pitting) present.     Comments: (had lymph node removal 2014 in right arm)  Skin:    General: Skin is warm and dry.  Neurological:     General: No focal deficit present.     Mental Status: She is alert and oriented to person, place, and time.  Psychiatric:        Mood and Affect: Mood normal.        Behavior: Behavior normal.   Assessment & Plan:  1: Chronic heart failure with preserved ejection fraction- - NYHA class III - euvolemic today - weighing daily; reminded to weigh every morning, write the weight down and call for an overnight weight gain of >2 pounds or a weekly weight gain of >5 pounds - weight up 7 pounds from last visit here 6 months ago (total 24 pounds in the last year) - encouraged to make activity a priority even for a short period of time - saw cardiology Fletcher Anon) 11/04/19 - not adding salt and has been reading food labels; reviewed the importance of closely following a 2000mg  sodium diet  - last night did eat [employer] and chicken - BNP 10/30/2018 was 156.0  2: HTN- - BP initially elevated but then rechecked with a manual cuff was much improved - BMP from 12/30/19 reviewed and showed sodium 139, potassium 4.5, creatinine 1.84 and GFR 32 - last saw PCP [facility])  - saw nephrology Holley Raring) 09/25/19 & returns in 2 days  3: Lymphedema- - stage 2 - has not been elevating her legs much over the last few days as she's been busy at the house she's trying to clear out  4: Obstructive sleep apnea- - saw pulmonology Volanda Napoleon) 08/04/19 - wearing CPAP   Medication bottles were  reviewed.   Return in 6 months or sooner for any questions/problems before then.

## 2020-01-27 ENCOUNTER — Other Ambulatory Visit: Payer: Self-pay

## 2020-01-27 ENCOUNTER — Encounter: Payer: Self-pay | Admitting: Family

## 2020-01-27 ENCOUNTER — Ambulatory Visit: Payer: BC Managed Care – PPO | Attending: Family | Admitting: Family

## 2020-01-27 VITALS — BP 132/60 | HR 70 | Resp 20 | Ht 64.0 in | Wt 363.4 lb

## 2020-01-27 DIAGNOSIS — G4733 Obstructive sleep apnea (adult) (pediatric): Secondary | ICD-10-CM | POA: Insufficient documentation

## 2020-01-27 DIAGNOSIS — Z7951 Long term (current) use of inhaled steroids: Secondary | ICD-10-CM | POA: Insufficient documentation

## 2020-01-27 DIAGNOSIS — Z9989 Dependence on other enabling machines and devices: Secondary | ICD-10-CM | POA: Insufficient documentation

## 2020-01-27 DIAGNOSIS — Z86711 Personal history of pulmonary embolism: Secondary | ICD-10-CM | POA: Diagnosis not present

## 2020-01-27 DIAGNOSIS — I890 Lymphedema, not elsewhere classified: Secondary | ICD-10-CM | POA: Diagnosis not present

## 2020-01-27 DIAGNOSIS — N183 Chronic kidney disease, stage 3 unspecified: Secondary | ICD-10-CM | POA: Insufficient documentation

## 2020-01-27 DIAGNOSIS — Z79899 Other long term (current) drug therapy: Secondary | ICD-10-CM | POA: Insufficient documentation

## 2020-01-27 DIAGNOSIS — Z7901 Long term (current) use of anticoagulants: Secondary | ICD-10-CM | POA: Insufficient documentation

## 2020-01-27 DIAGNOSIS — I5032 Chronic diastolic (congestive) heart failure: Secondary | ICD-10-CM | POA: Diagnosis not present

## 2020-01-27 DIAGNOSIS — Z79890 Hormone replacement therapy (postmenopausal): Secondary | ICD-10-CM | POA: Insufficient documentation

## 2020-01-27 DIAGNOSIS — E1122 Type 2 diabetes mellitus with diabetic chronic kidney disease: Secondary | ICD-10-CM | POA: Insufficient documentation

## 2020-01-27 DIAGNOSIS — G4730 Sleep apnea, unspecified: Secondary | ICD-10-CM

## 2020-01-27 DIAGNOSIS — M109 Gout, unspecified: Secondary | ICD-10-CM | POA: Diagnosis not present

## 2020-01-27 DIAGNOSIS — Z853 Personal history of malignant neoplasm of breast: Secondary | ICD-10-CM | POA: Insufficient documentation

## 2020-01-27 DIAGNOSIS — I130 Hypertensive heart and chronic kidney disease with heart failure and stage 1 through stage 4 chronic kidney disease, or unspecified chronic kidney disease: Secondary | ICD-10-CM | POA: Insufficient documentation

## 2020-01-27 DIAGNOSIS — I10 Essential (primary) hypertension: Secondary | ICD-10-CM

## 2020-01-27 NOTE — Patient Instructions (Signed)
Continue weighing daily and call for an overnight weight gain of > 2 pounds or a weekly weight gain of >5 pounds. 

## 2020-01-29 DIAGNOSIS — N1832 Chronic kidney disease, stage 3b: Secondary | ICD-10-CM | POA: Diagnosis not present

## 2020-01-29 DIAGNOSIS — I10 Essential (primary) hypertension: Secondary | ICD-10-CM | POA: Diagnosis not present

## 2020-01-29 DIAGNOSIS — E1122 Type 2 diabetes mellitus with diabetic chronic kidney disease: Secondary | ICD-10-CM | POA: Diagnosis not present

## 2020-01-29 DIAGNOSIS — R809 Proteinuria, unspecified: Secondary | ICD-10-CM | POA: Diagnosis not present

## 2020-02-03 ENCOUNTER — Encounter: Payer: Self-pay | Admitting: Adult Health

## 2020-02-03 ENCOUNTER — Other Ambulatory Visit: Payer: Self-pay

## 2020-02-03 ENCOUNTER — Ambulatory Visit: Payer: BC Managed Care – PPO | Admitting: Adult Health

## 2020-02-03 ENCOUNTER — Ambulatory Visit: Payer: BC Managed Care – PPO | Admitting: Primary Care

## 2020-02-03 DIAGNOSIS — G4733 Obstructive sleep apnea (adult) (pediatric): Secondary | ICD-10-CM | POA: Diagnosis not present

## 2020-02-03 DIAGNOSIS — Z9989 Dependence on other enabling machines and devices: Secondary | ICD-10-CM | POA: Diagnosis not present

## 2020-02-03 DIAGNOSIS — Z6841 Body Mass Index (BMI) 40.0 and over, adult: Secondary | ICD-10-CM | POA: Insufficient documentation

## 2020-02-03 NOTE — Patient Instructions (Addendum)
Continue on CPAP At bedtime   Wear all night long .  Work on healthy weight loss  Do not drive if sleepy  Follow up with Dr. Halford Chessman or APP in [place] and As needed  -1 year

## 2020-02-03 NOTE — Assessment & Plan Note (Signed)
Good control on CPAP . encouarged on compliance  Patient education on OSA and CPAP given.   Plan  Patient Instructions  Continue on CPAP At bedtime   Wear all night long .  Work on healthy weight loss  Do not drive if sleepy  Follow up with Dr. Halford Chessman or APP in [place] and As needed  -1 year

## 2020-02-03 NOTE — Assessment & Plan Note (Signed)
Encouraged on healthy weight loss 

## 2020-02-03 NOTE — Progress Notes (Signed)
@Patient  ID: Nicole Schmidt, female    DOB: 1965/03/05, 54 y.o.   MRN: [id number]  Chief Complaint  Patient presents with  . Follow-up  . Sleep Apnea    Referring provider: Lenard Simmer, MD  HPI: 54 year old female seen for sleep consult January 14, 2019 for daytime sleepiness and snoring.  She was found to have obstructive sleep apnea. Medical history significant for chronic diastolic heart failure, A. fib, chronic kidney disease, history of breast cancer, history of PE, on chronic anticoagulation   TEST/EVENTS :  HST 04/26/19 >> AHI 17.1, SpO2 low 54%.  Spent 233.9 min with SpO2 < 89%.  02/03/2020 Follow up : OSA  Patient returns for a 68-month follow-up.  Patient has underlying moderate obstructive sleep apnea.  She was started on CPAP last visit.  Patient says she is trying to wear her CPAP more more.  She says she does feel that it does help. CPAP download shows 87% usage.  Daily average usage of 4.5 hours.  AHI 1.0.  Patient is on auto CPAP 5 to 20 cm H2O.  AHI 1.0.  Patient's current weight is 369 pounds.  BMI is at 69.8.  We discussed healthy weight loss.   No Known Allergies  Immunization History  Administered Date(s) Administered  . Influenza-Unspecified 11/05/2018  . PFIZER SARS-COV-2 Vaccination 05/19/2019, 06/16/2019    Past Medical History:  Diagnosis Date  . (HFpEF) heart failure with preserved ejection fraction ([place])    a. 10/2018 Echo: EF 60-65%, diast dysfxn, RVSP 50.7 mmHg, mildly dil LA.  Marland Kitchen Anemia   . Arthritis   . Breast cancer ([place]) 2014   right breast cancer  . Breast cancer of upper-inner quadrant of right female breast ([place])    Right breast invasive CA and DCIS , 7 mm T1,N0,M0. Er/PR pos, her 2 negative.  Margins 1 mm.  . CHF (congestive heart failure) ([place])   . CKD (chronic kidney disease), stage III ([place])   . Diabetes mellitus without complication ([place])   . Essential hypertension   . Gout   . Hypothyroidism   . Menopause    age 15  .  Morbid obesity ([place])   . Persistent atrial fibrillation ([place])    a. Dx 10/2018 in setting of PNA. CHA2DS2VASc = 4-->Eliquis; b. 11/2018 s/p successful DCCV.  Marland Kitchen Personal history of radiation therapy   . Pulmonary embolism ([place]) 10/2014   a. Chronic eliquis.  . Thyroid goiter     Tobacco History: Social History   Tobacco Use  Smoking Status Never Smoker  Smokeless Tobacco Never Used   Counseling given: Not Answered   Outpatient Medications Prior to Visit  Medication Sig Dispense Refill  . acetaminophen (TYLENOL) 500 MG tablet Take 1,000 mg by mouth every 6 (six) hours as needed (for pain).    Marland Kitchen albuterol (VENTOLIN HFA) 108 (90 Base) MCG/ACT inhaler Inhale 2 puffs into the lungs every 6 (six) hours as needed for wheezing or shortness of breath.    Marland Kitchen amLODipine (NORVASC) 5 MG tablet Take 5 mg by mouth 2 (two) times daily.    Marland Kitchen atorvastatin (LIPITOR) 20 MG tablet Take 20 mg by mouth at bedtime.     . calcium carbonate (OSCAL) 1500 (600 Ca) MG TABS tablet Take 600 mg of elemental calcium by mouth daily.     . Cholecalciferol (VITAMIN D3) 1.25 MG (50000 UT) CAPS Take 1 capsule by mouth once a week.    . Cholecalciferol (VITAMIN D3) 50000 units TABS Take 50,000  Units by mouth every Friday.     Marland Kitchen CINNAMON PO Take 1,000 mg by mouth daily.     . cloNIDine (CATAPRES) 0.2 MG tablet Take 1 tablet (0.2 mg total) by mouth 2 (two) times daily. 60 tablet 0  . Dulaglutide 1.5 MG/0.5ML SOPN Inject 1.5 mg into the skin every Monday. In the evening    . ELIQUIS 5 MG TABS tablet TAKE 1 TABLET TWICE A DAY 672 tablet 1  . folic acid (FOLVITE) 094 MCG tablet Take 800 mcg by mouth daily.     . furosemide (LASIX) 20 MG tablet Take 1 tablet (20 mg) by mouth once daily    . KLOR-CON M20 20 MEQ tablet TAKE 1 TABLET DAILY 90 tablet 3  . levothyroxine (SYNTHROID, LEVOTHROID) 112 MCG tablet Take 112 mcg daily before breakfast by mouth.    . losartan (COZAAR) 50 MG tablet Take 50 mg by mouth daily.    . Melatonin  10 MG TABS Take by mouth.    . metoprolol tartrate (LOPRESSOR) 50 MG tablet TAKE 1 TABLET TWICE A DAY 180 tablet 3  . nystatin (MYCOSTATIN/NYSTOP) powder Apply 1 g topically 2 (two) times daily as needed (rash (skin folds)).    . vitamin C (ASCORBIC ACID) 500 MG tablet Take 500 mg by mouth 2 (two) times daily.    . fluticasone-salmeterol (ADVAIR HFA) 230-21 MCG/ACT inhaler Inhale 2 puffs into the lungs 2 (two) times daily as needed.  (Patient not taking: Reported on 02/03/2020)     No facility-administered medications prior to visit.     Review of Systems:   Constitutional:   No  weight loss, night sweats,  Fevers, chills, + fatigue, or  lassitude.  HEENT:   No headaches,  Difficulty swallowing,  Tooth/dental problems, or  Sore throat,                No sneezing, itching, ear ache, nasal congestion, post nasal drip,   CV:  No chest pain,  Orthopnea, PND, +swelling in lower extremities, no anasarca, dizziness, palpitations, syncope.   GI  No heartburn, indigestion, abdominal pain, nausea, vomiting, diarrhea, change in bowel habits, loss of appetite, bloody stools.   Resp: No shortness of breath with exertion or at rest.  No excess mucus, no productive cough,  No non-productive cough,  No coughing up of blood.  No change in color of mucus.  No wheezing.  No chest wall deformity  Skin: no rash or lesions.  GU: no dysuria, change in color of urine, no urgency or frequency.  No flank pain, no hematuria   MS:  No joint pain or swelling.  No decreased range of motion.  No back pain.    Physical Exam  BP 124/64 (BP Location: Left Arm, Cuff Size: Normal)   Pulse 61   Temp (!) 97.4 F (36.3 C) (Oral)   Ht 5\' 1"  (1.549 m)   Wt (!) 369 lb 6.4 oz (167.6 kg)   LMP 07/17/2012   SpO2 97%   BMI 69.80 kg/m   GEN: A/Ox3; pleasant , NAD, well nourished    HEENT:  [place]/AT,   , NOSE-clear, THROAT-clear, no lesions, no postnasal drip or exudate noted.   NECK:  Supple w/ fair ROM; no JVD;  normal carotid impulses w/o bruits; no thyromegaly or nodules palpated; no lymphadenopathy.    RESP  Clear  P & A; w/o, wheezes/ rales/ or rhonchi. no accessory muscle use, no dullness to percussion  CARD:  RRR, no m/r/g, 1+  peripheral edema, pulses intact, no cyanosis or clubbing.  GI:   Soft & nt; nml bowel sounds; no organomegaly or masses detected.   Musco: Warm bil, no deformities or joint swelling noted.   Neuro: alert, no focal deficits noted.    Skin: Warm, no lesions or rashes    Lab Results:   BNP ProBNP No results found for: PROBNP  Imaging: No results found.    No flowsheet data found.  No results found for: NITRICOXIDE      Assessment & Plan:   OSA on CPAP Good control on CPAP . encouarged on compliance  Patient education on OSA and CPAP given.   Plan  Patient Instructions  Continue on CPAP At bedtime   Wear all night long .  Work on healthy weight loss  Do not drive if sleepy  Follow up with Dr. Halford Chessman or APP in [place] and As needed  -1 year      Morbid obesity ([place]) Encouraged on healthy weight loss.      Rexene Edison, NP 02/03/2020

## 2020-02-03 NOTE — Progress Notes (Signed)
Reviewed and agree with assessment/plan.   Chesley Mires, MD [facility] Pulmonary/Critical Care 02/03/2020, 2:25 PM Pager:  [phone]

## 2020-02-09 ENCOUNTER — Emergency Department: Payer: BC Managed Care – PPO

## 2020-02-09 ENCOUNTER — Encounter: Payer: Self-pay | Admitting: Emergency Medicine

## 2020-02-09 ENCOUNTER — Inpatient Hospital Stay (HOSPITAL_COMMUNITY)
Admit: 2020-02-09 | Discharge: 2020-02-09 | Disposition: A | Discharge status: Home / Self Care | Payer: BC Managed Care – PPO | Attending: Internal Medicine | Admitting: Internal Medicine

## 2020-02-09 ENCOUNTER — Inpatient Hospital Stay
Admission: EM | Admit: 2020-02-09 | Discharge: 2020-02-18 | DRG: 291 | Disposition: A | Discharge status: Home / Self Care | Payer: BC Managed Care – PPO | Attending: Internal Medicine | Admitting: Internal Medicine

## 2020-02-09 ENCOUNTER — Other Ambulatory Visit: Payer: Self-pay

## 2020-02-09 DIAGNOSIS — J9601 Acute respiratory failure with hypoxia: Secondary | ICD-10-CM | POA: Diagnosis present

## 2020-02-09 DIAGNOSIS — Z833 Family history of diabetes mellitus: Secondary | ICD-10-CM | POA: Diagnosis not present

## 2020-02-09 DIAGNOSIS — Z23 Encounter for immunization: Secondary | ICD-10-CM | POA: Diagnosis not present

## 2020-02-09 DIAGNOSIS — I517 Cardiomegaly: Secondary | ICD-10-CM | POA: Diagnosis not present

## 2020-02-09 DIAGNOSIS — I4891 Unspecified atrial fibrillation: Secondary | ICD-10-CM | POA: Diagnosis not present

## 2020-02-09 DIAGNOSIS — N183 Chronic kidney disease, stage 3 unspecified: Secondary | ICD-10-CM | POA: Diagnosis not present

## 2020-02-09 DIAGNOSIS — I5043 Acute on chronic combined systolic (congestive) and diastolic (congestive) heart failure: Secondary | ICD-10-CM | POA: Diagnosis present

## 2020-02-09 DIAGNOSIS — Z7901 Long term (current) use of anticoagulants: Secondary | ICD-10-CM

## 2020-02-09 DIAGNOSIS — Z66 Do not resuscitate: Secondary | ICD-10-CM | POA: Diagnosis present

## 2020-02-09 DIAGNOSIS — Z86711 Personal history of pulmonary embolism: Secondary | ICD-10-CM | POA: Diagnosis not present

## 2020-02-09 DIAGNOSIS — Z853 Personal history of malignant neoplasm of breast: Secondary | ICD-10-CM

## 2020-02-09 DIAGNOSIS — I482 Chronic atrial fibrillation, unspecified: Secondary | ICD-10-CM | POA: Diagnosis not present

## 2020-02-09 DIAGNOSIS — I10 Essential (primary) hypertension: Secondary | ICD-10-CM

## 2020-02-09 DIAGNOSIS — I700 Atherosclerosis of aorta: Secondary | ICD-10-CM | POA: Diagnosis not present

## 2020-02-09 DIAGNOSIS — M1990 Unspecified osteoarthritis, unspecified site: Secondary | ICD-10-CM | POA: Diagnosis present

## 2020-02-09 DIAGNOSIS — M109 Gout, unspecified: Secondary | ICD-10-CM | POA: Diagnosis present

## 2020-02-09 DIAGNOSIS — B370 Candidal stomatitis: Secondary | ICD-10-CM | POA: Diagnosis not present

## 2020-02-09 DIAGNOSIS — I5033 Acute on chronic diastolic (congestive) heart failure: Secondary | ICD-10-CM | POA: Diagnosis not present

## 2020-02-09 DIAGNOSIS — R059 Cough, unspecified: Secondary | ICD-10-CM | POA: Diagnosis not present

## 2020-02-09 DIAGNOSIS — E785 Hyperlipidemia, unspecified: Secondary | ICD-10-CM | POA: Diagnosis present

## 2020-02-09 DIAGNOSIS — N189 Chronic kidney disease, unspecified: Secondary | ICD-10-CM

## 2020-02-09 DIAGNOSIS — E875 Hyperkalemia: Secondary | ICD-10-CM | POA: Diagnosis not present

## 2020-02-09 DIAGNOSIS — G4733 Obstructive sleep apnea (adult) (pediatric): Secondary | ICD-10-CM | POA: Diagnosis present

## 2020-02-09 DIAGNOSIS — I509 Heart failure, unspecified: Secondary | ICD-10-CM

## 2020-02-09 DIAGNOSIS — R0602 Shortness of breath: Secondary | ICD-10-CM | POA: Diagnosis not present

## 2020-02-09 DIAGNOSIS — Z6841 Body Mass Index (BMI) 40.0 and over, adult: Secondary | ICD-10-CM

## 2020-02-09 DIAGNOSIS — I5021 Acute systolic (congestive) heart failure: Secondary | ICD-10-CM

## 2020-02-09 DIAGNOSIS — I130 Hypertensive heart and chronic kidney disease with heart failure and stage 1 through stage 4 chronic kidney disease, or unspecified chronic kidney disease: Principal | ICD-10-CM | POA: Diagnosis present

## 2020-02-09 DIAGNOSIS — R9389 Abnormal findings on diagnostic imaging of other specified body structures: Secondary | ICD-10-CM

## 2020-02-09 DIAGNOSIS — E669 Obesity, unspecified: Secondary | ICD-10-CM

## 2020-02-09 DIAGNOSIS — Z79899 Other long term (current) drug therapy: Secondary | ICD-10-CM

## 2020-02-09 DIAGNOSIS — Z20822 Contact with and (suspected) exposure to covid-19: Secondary | ICD-10-CM | POA: Diagnosis present

## 2020-02-09 DIAGNOSIS — E1169 Type 2 diabetes mellitus with other specified complication: Secondary | ICD-10-CM | POA: Diagnosis present

## 2020-02-09 DIAGNOSIS — J9811 Atelectasis: Secondary | ICD-10-CM | POA: Diagnosis present

## 2020-02-09 DIAGNOSIS — J9690 Respiratory failure, unspecified, unspecified whether with hypoxia or hypercapnia: Secondary | ICD-10-CM | POA: Diagnosis not present

## 2020-02-09 DIAGNOSIS — E278 Other specified disorders of adrenal gland: Secondary | ICD-10-CM | POA: Diagnosis present

## 2020-02-09 DIAGNOSIS — I4819 Other persistent atrial fibrillation: Secondary | ICD-10-CM | POA: Diagnosis not present

## 2020-02-09 DIAGNOSIS — E039 Hypothyroidism, unspecified: Secondary | ICD-10-CM | POA: Diagnosis present

## 2020-02-09 DIAGNOSIS — Z9989 Dependence on other enabling machines and devices: Secondary | ICD-10-CM | POA: Diagnosis not present

## 2020-02-09 DIAGNOSIS — J9621 Acute and chronic respiratory failure with hypoxia: Secondary | ICD-10-CM | POA: Diagnosis present

## 2020-02-09 DIAGNOSIS — E1122 Type 2 diabetes mellitus with diabetic chronic kidney disease: Secondary | ICD-10-CM | POA: Diagnosis present

## 2020-02-09 DIAGNOSIS — G4730 Sleep apnea, unspecified: Secondary | ICD-10-CM

## 2020-02-09 DIAGNOSIS — Z8589 Personal history of malignant neoplasm of other organs and systems: Secondary | ICD-10-CM

## 2020-02-09 DIAGNOSIS — I493 Ventricular premature depolarization: Secondary | ICD-10-CM | POA: Diagnosis present

## 2020-02-09 DIAGNOSIS — N1832 Chronic kidney disease, stage 3b: Secondary | ICD-10-CM | POA: Diagnosis present

## 2020-02-09 DIAGNOSIS — Z79890 Hormone replacement therapy (postmenopausal): Secondary | ICD-10-CM | POA: Diagnosis not present

## 2020-02-09 DIAGNOSIS — Z8261 Family history of arthritis: Secondary | ICD-10-CM | POA: Diagnosis not present

## 2020-02-09 DIAGNOSIS — Z923 Personal history of irradiation: Secondary | ICD-10-CM | POA: Diagnosis not present

## 2020-02-09 DIAGNOSIS — R5381 Other malaise: Secondary | ICD-10-CM

## 2020-02-09 DIAGNOSIS — Z800 Family history of malignant neoplasm of digestive organs: Secondary | ICD-10-CM

## 2020-02-09 DIAGNOSIS — Z820 Family history of epilepsy and other diseases of the nervous system: Secondary | ICD-10-CM

## 2020-02-09 DIAGNOSIS — I5032 Chronic diastolic (congestive) heart failure: Secondary | ICD-10-CM

## 2020-02-09 DIAGNOSIS — Z8249 Family history of ischemic heart disease and other diseases of the circulatory system: Secondary | ICD-10-CM

## 2020-02-09 HISTORY — DX: Type 2 diabetes mellitus with other specified complication: E11.69

## 2020-02-09 HISTORY — DX: Hypothyroidism, unspecified: E03.9

## 2020-02-09 HISTORY — DX: Type 2 diabetes mellitus with other specified complication: E66.9

## 2020-02-09 HISTORY — DX: Hyperlipidemia, unspecified: E78.5

## 2020-02-09 LAB — CBC
HCT: 39.3 % (ref 36.0–46.0)
Hemoglobin: 11.7 g/dL — ABNORMAL LOW (ref 12.0–15.0)
MCH: 27.6 pg (ref 26.0–34.0)
MCHC: 29.8 g/dL — ABNORMAL LOW (ref 30.0–36.0)
MCV: 92.7 fL (ref 80.0–100.0)
Platelets: 234 10*3/uL (ref 150–400)
RBC: 4.24 MIL/uL (ref 3.87–5.11)
RDW: 14.4 % (ref 11.5–15.5)
WBC: 15.4 10*3/uL — ABNORMAL HIGH (ref 4.0–10.5)
nRBC: 0 % (ref 0.0–0.2)

## 2020-02-09 LAB — COMPREHENSIVE METABOLIC PANEL
ALT: 21 U/L (ref 0–44)
AST: 19 U/L (ref 15–41)
Albumin: 3.7 g/dL (ref 3.5–5.0)
Alkaline Phosphatase: 146 U/L — ABNORMAL HIGH (ref 38–126)
Anion gap: 8 (ref 5–15)
BUN: 44 mg/dL — ABNORMAL HIGH (ref 6–20)
CO2: 31 mmol/L (ref 22–32)
Calcium: 9.3 mg/dL (ref 8.9–10.3)
Chloride: 104 mmol/L (ref 98–111)
Creatinine, Ser: 1.87 mg/dL — ABNORMAL HIGH (ref 0.44–1.00)
GFR, Estimated: 31 mL/min — ABNORMAL LOW (ref >60)
Glucose, Bld: 128 mg/dL — ABNORMAL HIGH (ref 70–99)
Potassium: 5.2 mmol/L — ABNORMAL HIGH (ref 3.5–5.1)
Sodium: 143 mmol/L (ref 135–145)
Total Bilirubin: 0.5 mg/dL (ref 0.3–1.2)
Total Protein: 7.8 g/dL (ref 6.5–8.1)

## 2020-02-09 LAB — TROPONIN I (HIGH SENSITIVITY)
Troponin I (High Sensitivity): 14 ng/L (ref <18)
Troponin I (High Sensitivity): 14 ng/L (ref <18)

## 2020-02-09 LAB — CBG MONITORING, ED
Glucose-Capillary: 135 mg/dL — ABNORMAL HIGH (ref 70–99)
Glucose-Capillary: 91 mg/dL (ref 70–99)
Glucose-Capillary: 111 mg/dL — ABNORMAL HIGH (ref 70–99)

## 2020-02-09 LAB — LIPID PANEL
Cholesterol: 122 mg/dL (ref 0–200)
HDL: 49 mg/dL (ref >40)
LDL Cholesterol: 42 mg/dL (ref 0–99)
Total CHOL/HDL Ratio: 2.5 RATIO
Triglycerides: 153 mg/dL — ABNORMAL HIGH (ref <150)
VLDL: 31 mg/dL (ref 0–40)

## 2020-02-09 LAB — ECHOCARDIOGRAM COMPLETE
AR max vel: 2.33 cm2
AV Area VTI: 2.62 cm2
AV Area mean vel: 2.32 cm2
AV Mean grad: 9 mmHg
AV Peak grad: 15.8 mmHg
Ao pk vel: 1.99 m/s
Area-P 1/2: 3.2 cm2
Height: 64 in
S' Lateral: 2.42 cm
Weight: 5760 oz

## 2020-02-09 LAB — BRAIN NATRIURETIC PEPTIDE: B Natriuretic Peptide: 197.9 pg/mL — ABNORMAL HIGH (ref 0.0–100.0)

## 2020-02-09 LAB — TSH: TSH: 0.727 u[IU]/mL (ref 0.350–4.500)

## 2020-02-09 LAB — RESP PANEL BY RT-PCR (FLU A&B, COVID) ARPGX2
Influenza A by PCR: NEGATIVE
Influenza B by PCR: NEGATIVE
SARS Coronavirus 2 by RT PCR: NEGATIVE

## 2020-02-09 LAB — HEMOGLOBIN A1C
Hgb A1c MFr Bld: 6.4 % — ABNORMAL HIGH (ref 4.8–5.6)
Mean Plasma Glucose: 136.98 mg/dL

## 2020-02-09 MED ORDER — LOSARTAN POTASSIUM 50 MG PO TABS
50.0000 mg | ORAL_TABLET | ORAL | Status: AC
  Administered 2020-02-09: 50 mg via ORAL
  Filled 2020-02-09: qty 1

## 2020-02-09 MED ORDER — FUROSEMIDE 10 MG/ML IJ SOLN
40.0000 mg | Freq: Once | INTRAMUSCULAR | Status: AC
  Administered 2020-02-09: 40 mg via INTRAVENOUS
  Filled 2020-02-09: qty 4

## 2020-02-09 MED ORDER — METOPROLOL TARTRATE 50 MG PO TABS
50.0000 mg | ORAL_TABLET | Freq: Once | ORAL | Status: AC
  Administered 2020-02-09: 50 mg via ORAL
  Filled 2020-02-09: qty 1

## 2020-02-09 MED ORDER — AMLODIPINE BESYLATE 5 MG PO TABS
5.0000 mg | ORAL_TABLET | Freq: Once | ORAL | Status: AC
  Administered 2020-02-09: 5 mg via ORAL
  Filled 2020-02-09: qty 1

## 2020-02-09 MED ORDER — CLONIDINE HCL 0.1 MG PO TABS
0.2000 mg | ORAL_TABLET | Freq: Once | ORAL | Status: AC
  Administered 2020-02-09: 0.2 mg via ORAL
  Filled 2020-02-09: qty 2

## 2020-02-09 MED ORDER — INSULIN ASPART 100 UNIT/ML ~~LOC~~ SOLN
0.0000 [IU] | Freq: Three times a day (TID) | SUBCUTANEOUS | Status: DC
  Administered 2020-02-09: 2 [IU] via SUBCUTANEOUS
  Administered 2020-02-10 – 2020-02-11 (×2): 3 [IU] via SUBCUTANEOUS
  Administered 2020-02-12: 2 [IU] via SUBCUTANEOUS
  Administered 2020-02-12: 3 [IU] via SUBCUTANEOUS
  Administered 2020-02-12 – 2020-02-13 (×4): 2 [IU] via SUBCUTANEOUS
  Administered 2020-02-14: 3 [IU] via SUBCUTANEOUS
  Administered 2020-02-15: 2 [IU] via SUBCUTANEOUS
  Administered 2020-02-15: 3 [IU] via SUBCUTANEOUS
  Administered 2020-02-16: 2 [IU] via SUBCUTANEOUS
  Administered 2020-02-16 – 2020-02-17 (×2): 3 [IU] via SUBCUTANEOUS
  Administered 2020-02-18 (×2): 2 [IU] via SUBCUTANEOUS
  Filled 2020-02-09 (×17): qty 1

## 2020-02-09 MED ORDER — FOLIC ACID 1 MG PO TABS
1000.0000 ug | ORAL_TABLET | Freq: Every day | ORAL | Status: DC
  Administered 2020-02-09 – 2020-02-18 (×10): 1 mg via ORAL
  Filled 2020-02-09 (×10): qty 1

## 2020-02-09 MED ORDER — FUROSEMIDE 10 MG/ML IJ SOLN
40.0000 mg | Freq: Two times a day (BID) | INTRAMUSCULAR | Status: AC
  Administered 2020-02-09 – 2020-02-10 (×2): 40 mg via INTRAVENOUS
  Filled 2020-02-09 (×2): qty 4

## 2020-02-09 MED ORDER — LEVOTHYROXINE SODIUM 112 MCG PO TABS
112.0000 ug | ORAL_TABLET | Freq: Every day | ORAL | Status: DC
  Administered 2020-02-10 – 2020-02-18 (×9): 112 ug via ORAL
  Filled 2020-02-09 (×12): qty 1

## 2020-02-09 MED ORDER — MELATONIN 5 MG PO TABS
10.0000 mg | ORAL_TABLET | Freq: Every evening | ORAL | Status: DC | PRN
  Administered 2020-02-09 – 2020-02-18 (×9): 10 mg via ORAL
  Filled 2020-02-09 (×9): qty 2

## 2020-02-09 MED ORDER — ATORVASTATIN CALCIUM 20 MG PO TABS
20.0000 mg | ORAL_TABLET | Freq: Every day | ORAL | Status: DC
  Administered 2020-02-09 – 2020-02-17 (×9): 20 mg via ORAL
  Filled 2020-02-09 (×9): qty 1

## 2020-02-09 MED ORDER — SODIUM CHLORIDE 0.9 % IV SOLN
250.0000 mL | INTRAVENOUS | Status: DC | PRN
  Administered 2020-02-10 – 2020-02-13 (×2): 250 mL via INTRAVENOUS

## 2020-02-09 MED ORDER — SODIUM CHLORIDE 0.9 % IV SOLN
500.0000 mg | Freq: Once | INTRAVENOUS | Status: AC
  Administered 2020-02-09: 500 mg via INTRAVENOUS
  Filled 2020-02-09: qty 500

## 2020-02-09 MED ORDER — LOSARTAN POTASSIUM 50 MG PO TABS
50.0000 mg | ORAL_TABLET | Freq: Every day | ORAL | Status: DC
  Administered 2020-02-10: 50 mg via ORAL
  Filled 2020-02-09: qty 1

## 2020-02-09 MED ORDER — CLONIDINE HCL 0.1 MG PO TABS
0.2000 mg | ORAL_TABLET | Freq: Two times a day (BID) | ORAL | Status: DC
  Administered 2020-02-09 – 2020-02-18 (×18): 0.2 mg via ORAL
  Filled 2020-02-09 (×18): qty 2

## 2020-02-09 MED ORDER — HYDRALAZINE HCL 20 MG/ML IJ SOLN: 5.0000 mg | INTRAMUSCULAR | Status: DC | PRN

## 2020-02-09 MED ORDER — SODIUM CHLORIDE 0.9% FLUSH: 3.0000 mL | INTRAVENOUS | Status: DC | PRN

## 2020-02-09 MED ORDER — METOPROLOL TARTRATE 50 MG PO TABS
50.0000 mg | ORAL_TABLET | Freq: Two times a day (BID) | ORAL | Status: DC
  Administered 2020-02-09 – 2020-02-18 (×18): 50 mg via ORAL
  Filled 2020-02-09 (×18): qty 1

## 2020-02-09 MED ORDER — NYSTATIN 100000 UNIT/ML MT SUSP
5.0000 mL | Freq: Four times a day (QID) | OROMUCOSAL | Status: DC
  Administered 2020-02-09 – 2020-02-18 (×35): 500000 [IU] via ORAL
  Filled 2020-02-09 (×38): qty 5

## 2020-02-09 MED ORDER — SODIUM CHLORIDE 0.9% FLUSH
3.0000 mL | Freq: Two times a day (BID) | INTRAVENOUS | Status: DC
  Administered 2020-02-09 – 2020-02-18 (×17): 3 mL via INTRAVENOUS

## 2020-02-09 MED ORDER — NITROGLYCERIN 2 % TD OINT
1.0000 [in_us] | TOPICAL_OINTMENT | Freq: Once | TRANSDERMAL | Status: AC
  Administered 2020-02-09: 1 [in_us] via TOPICAL
  Filled 2020-02-09: qty 1

## 2020-02-09 MED ORDER — APIXABAN 5 MG PO TABS
5.0000 mg | ORAL_TABLET | Freq: Two times a day (BID) | ORAL | Status: DC
  Administered 2020-02-09 – 2020-02-18 (×19): 5 mg via ORAL
  Filled 2020-02-09 (×20): qty 1

## 2020-02-09 MED ORDER — FUROSEMIDE 10 MG/ML IJ SOLN: 40.0000 mg | Freq: Two times a day (BID) | INTRAMUSCULAR | Status: DC

## 2020-02-09 MED ORDER — AMLODIPINE BESYLATE 5 MG PO TABS
5.0000 mg | ORAL_TABLET | Freq: Two times a day (BID) | ORAL | Status: DC
  Administered 2020-02-09 – 2020-02-15 (×12): 5 mg via ORAL
  Filled 2020-02-09 (×12): qty 1

## 2020-02-09 NOTE — ED Notes (Signed)
Patient transported to CT 

## 2020-02-09 NOTE — Evaluation (Signed)
Physical Therapy Evaluation Patient Details Name: Nicole Schmidt MRN: [id number] DOB: 14-Feb-1966 Today's Date: 02/09/2020   History of Present Illness  Pt is a 54 y.o. female with medical history significant of PE (2016); afib on Eliquis; morbid obesity (BMI 61.79); hypothyroidism; HTN; DM; OSA on CPAP; stage 3 CKD; chronic diastolic CHF; and remote breast cancer presenting with SOB.  MD assessment includes: CHF and acute respiratory failure with hypoxia.    Clinical Impression  Pt was pleasant and motivated to participate during the session.  Pt found on 2LO2/min with SpO2 98% and HR 67 bpm.  Pt steady with transfers demonstrating very good eccentric and concentric control and stability.  After amb 30 feet pt's SpO2 was 95% with HR 85 bpm.  After amb 125 feet SpO2 was 87% and HR 90 bpm with SpO2 returning to >/= 92% after sitting 15-30 sec.  Pt reported ambulating 125 feet as "pretty hard" from an effort perspective but was steady throughout.  Pt will benefit from OPPT services upon discharge to safely address deficits listed in patient problem list for decreased risk of further functional decline and eventual return to PLOF.      Follow Up Recommendations Outpatient PT    Equipment Recommendations  None recommended by PT    Recommendations for Other Services       Precautions / Restrictions Precautions Precautions: Fall Restrictions Weight Bearing Restrictions: No      Mobility  Bed Mobility               General bed mobility comments: NT, pt in chair with no bed in room (in process of being replaced with new bed)    Transfers Overall transfer level: Needs assistance Equipment used: None Transfers: Sit to/from Stand Sit to Stand: Supervision         General transfer comment: Good eccentric and concentric control and stability  Ambulation/Gait Ambulation/Gait assistance: Supervision Gait Distance (Feet): 125 Feet x 1, 30 Feet x 1 Assistive device: None Gait  Pattern/deviations: Step-through pattern;Decreased step length - left;Decreased step length - right Gait velocity: decreased   General Gait Details: Steady ambulation including during turns and in tight spaces with minimally decreased cadence and B step length  Stairs            Wheelchair Mobility    Modified Rankin (Stroke Patients Only)       Balance Overall balance assessment: No apparent balance deficits (not formally assessed)                                           Pertinent Vitals/Pain Pain Assessment: No/denies pain    Home Living Family/patient expects to be discharged to:: Private residence Living Arrangements: Alone Available Help at Discharge: Family;Friend(s);Available PRN/intermittently Type of Home: House Home Access: Ramped entrance     Home Layout: One level Home Equipment: Shower seat;Wheelchair - manual;Cane - single point;Walker - 2 wheels      Prior Function Level of Independence: Independent         Comments: Ind amb without an AD, no fall history, community ambulator, Ind with ADLs, drives     Hand Dominance   Dominant Hand: Right    Extremity/Trunk Assessment   Upper Extremity Assessment Upper Extremity Assessment: Overall WFL for tasks assessed    Lower Extremity Assessment Lower Extremity Assessment: Generalized weakness       Communication  Communication: No difficulties  Cognition Arousal/Alertness: Awake/alert Behavior During Therapy: WFL for tasks assessed/performed Overall Cognitive Status: Within Functional Limits for tasks assessed                                        General Comments      Exercises     Assessment/Plan    PT Assessment Patient needs continued PT services  PT Problem List Decreased strength;Decreased activity tolerance;Cardiopulmonary status limiting activity       PT Treatment Interventions DME instruction;Gait training;Functional mobility  training;Therapeutic activities;Therapeutic exercise;Balance training;Patient/family education    PT Goals (Current goals can be found in the Care Plan section)  Acute Rehab PT Goals Patient Stated Goal: Improved endurance PT Goal Formulation: With patient Time For Goal Achievement: 02/22/20 Potential to Achieve Goals: Good    Frequency Min 2X/week   Barriers to discharge        Co-evaluation               AM-PAC PT "6 Clicks" Mobility  Outcome Measure Help needed turning from your back to your side while in a flat bed without using bedrails?: None Help needed moving from lying on your back to sitting on the side of a flat bed without using bedrails?: None Help needed moving to and from a bed to a chair (including a wheelchair)?: A Little Help needed standing up from a chair using your arms (e.g., wheelchair or bedside chair)?: A Little Help needed to walk in hospital room?: A Little Help needed climbing 3-5 steps with a railing? : A Little 6 Click Score: 20    End of Session Equipment Utilized During Treatment: Gait belt;Oxygen Activity Tolerance: Patient tolerated treatment well Patient left: in chair;with nursing/sitter in room;Other (comment) (Nursing in room bringing in new bed) Nurse Communication: Mobility status;Other (comment) (SpO2 response to activity) PT Visit Diagnosis: Muscle weakness (generalized) (M62.81);Difficulty in walking, not elsewhere classified (R26.2)    Time: 9798-9211 PT Time Calculation (min) (ACUTE ONLY): 24 min   Charges:   PT Evaluation $PT Eval Moderate Complexity: 1 Mod          D. Scott Dontaye Hur PT, DPT 02/09/20, 5:24 PM

## 2020-02-09 NOTE — ED Notes (Signed)
Pt room air sats with ambulation noted to be 85%. Pt placed on 2L [place] with o2 sats increased to 95%, pt denies shob or increased wob.

## 2020-02-09 NOTE — ED Provider Notes (Addendum)
[facility] Emergency Department Provider Note  ____________________________________________   Event Date/Time   First MD Initiated Contact with Patient 02/09/20 [phone]     (approximate)  I have reviewed the triage vital signs and the nursing notes.   HISTORY  Chief Complaint Cough, Nasal Congestion, and Shortness of Breath    HPI Nicole Schmidt is a 54 y.o. female with extensive chronic medical issues which include CHF, history of pulmonary emboli on Eliquis, morbid obesity, sleep apnea, and chronic kidney disease.  She presents tonight for worsening shortness of breath.  She said the symptoms been gradually getting worse for about 3 to 4 days.  She also said that she has had increased swelling in her legs and that her weight is up substantially.  She thinks that because of the holidays she may have had some dietary indiscretions including additional sodium intake.  She has been compliant with all of her medications including her Eliquis and her furosemide 20 mg daily.  Exertion makes her shortness of breath quite a bit worse and she had oxygen saturation of 87% when she was brought back to her exam room.  She does not use supplemental oxygen at home although she does use a CPAP at night.  The patient denies fever, sore throat, chest pain, nausea, vomiting, abdominal pain, and dysuria.  She said that she feels like she has been coughing and had "chest congestion".  By tonight her symptoms are severe.        Past Medical History:  Diagnosis Date  . (HFpEF) heart failure with preserved ejection fraction ([place])    a. 10/2018 Echo: EF 60-65%, diast dysfxn, RVSP 50.7 mmHg, mildly dil LA.  Marland Kitchen Anemia   . Arthritis   . Breast cancer ([place]) 2014   right breast cancer  . Breast cancer of upper-inner quadrant of right female breast ([place])    Right breast invasive CA and DCIS , 7 mm T1,N0,M0. Er/PR pos, her 2 negative.  Margins 1 mm.  . CHF (congestive heart failure)  ([place])   . CKD (chronic kidney disease), stage III ([place])   . Diabetes mellitus without complication ([place])   . Essential hypertension   . Gout   . Hypothyroidism   . Menopause    age 58  . Morbid obesity ([place])   . Persistent atrial fibrillation ([place])    a. Dx 10/2018 in setting of PNA. CHA2DS2VASc = 4-->Eliquis; b. 11/2018 s/p successful DCCV.  Marland Kitchen Personal history of radiation therapy   . Pulmonary embolism ([place]) 10/2014   a. Chronic eliquis.  . Thyroid goiter     Patient Active Problem List   Diagnosis Date Noted  . CHF (congestive heart failure) ([place]) 02/09/2020  . Morbid obesity ([place]) 02/03/2020  . Chronic anticoagulation 12/30/2019  . OSA on CPAP 08/04/2019  . Atrial fibrillation with RVR ([place]) 10/18/2018  . Acute hypoxemic respiratory failure ([place]) 03/29/2018  . Chronic diastolic heart failure ([place]) 02/04/2018  . Lymphedema 02/04/2018  . HTN (hypertension) 12/09/2017  . Long term (current) use of aromatase inhibitors 11/17/2017  . Elevated alkaline phosphatase level 05/20/2017  . Goals of care, counseling/discussion 05/14/2017  . Chronic renal disease, stage III ([place]) 07/08/2015  . Iron deficiency anemia 07/08/2015  . Acute renal failure superimposed on stage 3 chronic kidney disease ([place]) 10/22/2014  . History of breast cancer 10/22/2014  . Elevated troponin 10/22/2014  . Pulmonary emboli ([place]) 10/18/2014  . Pulmonary embolism ([place]) 10/18/2014  . Candidiasis of breast 07/06/2014  .  Malignant neoplasm of right female breast ([place]) 07/24/2012    Past Surgical History:  Procedure Laterality Date  . BREAST BIOPSY Right 2014   breast ca  . BREAST EXCISIONAL BIOPSY Right 07/19/2012   breast ca  . BREAST SURGERY Right 2014   with sentinel node bx subareaolar duct excision  . CARDIOVERSION N/A 11/28/2018   Procedure: CARDIOVERSION;  Surgeon: Minna Merritts, MD;  Location: [facility] ORS;  Service: Cardiovascular;  Laterality: N/A;  . COLONOSCOPY WITH PROPOFOL N/A 01/30/2017    Procedure: COLONOSCOPY WITH PROPOFOL;  Surgeon: Christene Lye, MD;  Location: [facility] ENDOSCOPY;  Service: Endoscopy;  Laterality: N/A;    Prior to Admission medications   Medication Sig Start Date End Date Taking? Authorizing Provider  acetaminophen (TYLENOL) 500 MG tablet Take 1,000 mg by mouth every 6 (six) hours as needed (for pain).   Yes [provider]  amLODipine (NORVASC) 5 MG tablet Take 5 mg by mouth 2 (two) times daily. 09/18/18  Yes [provider]  atorvastatin (LIPITOR) 20 MG tablet Take 20 mg by mouth at bedtime.    Yes [provider]  calcium carbonate (OSCAL) 1500 (600 Ca) MG TABS tablet Take 600 mg of elemental calcium by mouth daily.    Yes [provider]  Chlorpheniramine-Acetaminophen (CORICIDIN HBP COLD/FLU PO) Take 2 tablets by mouth 2 (two) times daily as needed.   Yes [provider]  Cholecalciferol (VITAMIN D3) 50000 units TABS Take 50,000 Units by mouth every Friday.    Yes [provider]  CINNAMON PO Take 1,000 mg by mouth daily.    Yes [provider]  cloNIDine (CATAPRES) 0.2 MG tablet Take 1 tablet (0.2 mg total) by mouth 2 (two) times daily. 10/21/18  Yes Mayo, Pete Pelt, MD  CRANBERRY CONCENTRATE PO Take 1 capsule by mouth daily.   Yes [provider]  Dulaglutide 1.5 MG/0.5ML SOPN Inject 1.5 mg into the skin every Monday. In the evening   Yes [provider]  ELIQUIS 5 MG TABS tablet TAKE 1 TABLET TWICE A DAY 12/23/19  Yes Corcoran, Melissa C, MD  folic acid (FOLVITE) 161 MCG tablet Take 800 mcg by mouth daily.    Yes [provider]  furosemide (LASIX) 20 MG tablet Take 1 tablet (20 mg) by mouth once daily   Yes [provider]  KLOR-CON M20 20 MEQ tablet TAKE 1 TABLET DAILY 10/20/19  Yes Darylene Price A, FNP  levothyroxine (SYNTHROID, LEVOTHROID) 112 MCG tablet Take 112 mcg daily before breakfast by mouth.   Yes [provider]  losartan (COZAAR)  50 MG tablet Take 50 mg by mouth daily.   Yes [provider]  Melatonin 10 MG TABS Take 10 mg by mouth at bedtime.   Yes [provider]  metoprolol tartrate (LOPRESSOR) 50 MG tablet TAKE 1 TABLET TWICE A DAY 08/04/19  Yes Wellington Hampshire, MD  nystatin (MYCOSTATIN/NYSTOP) powder Apply 1 g topically 2 (two) times daily as needed (rash (skin folds)).   Yes [provider]  vitamin C (ASCORBIC ACID) 500 MG tablet Take 500 mg by mouth 2 (two) times daily.   Yes [provider]  albuterol (VENTOLIN HFA) 108 (90 Base) MCG/ACT inhaler Inhale 2 puffs into the lungs every 6 (six) hours as needed for wheezing or shortness of breath. Patient not taking: Reported on 02/09/2020    [provider]  fluticasone-salmeterol (ADVAIR HFA) 230-21 MCG/ACT inhaler Inhale 2 puffs into the lungs 2 (two) times daily as  needed.  Patient not taking: Reported on 02/03/2020    [provider]    Allergies Patient has no known allergies.  Family History  Problem Relation Age of [place]  . Diabetes Father   . Hypertension Father   . Parkinson's disease Father   . Hypertension Mother   . Rheum arthritis Mother   . Atrial fibrillation Mother   . Heart failure Mother   . Heart attack Mother        died in her mid-70's.  . Colon cancer Cousin 33  . Breast cancer Neg Hx     Social History Social History   Tobacco Use  . Smoking status: Never Smoker  . Smokeless tobacco: Never Used  Vaping Use  . Vaping Use: Never used  Substance Use Topics  . Alcohol use: No    Alcohol/week: 0.0 standard drinks  . Drug use: No    Review of Systems Constitutional: No fever/chills Eyes: No visual changes. ENT: No sore throat. Cardiovascular: Denies chest pain. Respiratory: "Chest congestion".  Worsening shortness of breath with exertion. Gastrointestinal: No abdominal pain.  No nausea, no vomiting.  No diarrhea.  No constipation. Genitourinary: Negative for  dysuria. Musculoskeletal: Swelling in her legs with weight gain.  Negative for neck pain.  Negative for back pain. Integumentary: Negative for rash. Neurological: Negative for headaches, focal weakness or numbness.   ____________________________________________   PHYSICAL EXAM:  VITAL SIGNS: ED Triage Vitals  Enc Vitals Group     BP 02/09/20 0148 (!) 209/66     Pulse Rate 02/09/20 0148 77     Resp 02/09/20 0148 20     Temp 02/09/20 0148 98 F (36.7 C)     Temp Source 02/09/20 0148 Oral     SpO2 02/09/20 0148 94 %     Weight 02/09/20 0149 (!) 163.3 kg (360 lb)     Height 02/09/20 0149 1.626 m (5\' 4" )     Head Circumference --      Peak Flow --      Pain Score 02/09/20 0149 0     Pain Loc --      Pain Edu? --      Excl. in [place]? --     Constitutional: Alert and oriented.  Eyes: Conjunctivae are normal.  Head: Atraumatic. Nose: No congestion/rhinnorhea. Mouth/Throat: Patient is wearing a mask. Neck: No stridor.  No meningeal signs.   Cardiovascular: Normal rate, regular rhythm. Good peripheral circulation. Respiratory: Normal respiratory effort.  No retractions. spO2 87% at rest after brief exertion. Gastrointestinal: Soft and nontender. No distention.  Musculoskeletal: 1+ pitting edema in bilateral lower extremities.  Neurologic:  Normal speech and language. No gross focal neurologic deficits are appreciated.  Skin:  Skin is warm, dry and intact. Psychiatric: Mood and affect are normal. Speech and behavior are normal.  ____________________________________________   LABS (all labs ordered are listed, but only abnormal results are displayed)  Labs Reviewed  CBC - Abnormal; Notable for the following components:      Result Value   WBC 15.4 (*)    Hemoglobin 11.7 (*)    MCHC 29.8 (*)    All other components within normal limits  COMPREHENSIVE METABOLIC PANEL - Abnormal; Notable for the following components:   Potassium 5.2 (*)    Glucose, Bld 128 (*)    BUN 44 (*)     Creatinine, Ser 1.87 (*)    Alkaline Phosphatase 146 (*)    GFR, Estimated 31 (*)    All other  components within normal limits  BRAIN NATRIURETIC PEPTIDE - Abnormal; Notable for the following components:   B Natriuretic Peptide 197.9 (*)    All other components within normal limits  RESP PANEL BY RT-PCR (FLU A&B, COVID) ARPGX2  TROPONIN I (HIGH SENSITIVITY)  TROPONIN I (HIGH SENSITIVITY)   ____________________________________________  EKG  ED ECG REPORT I, Hinda Kehr, the attending physician, personally viewed and interpreted this ECG.  Date: 02/09/2020 EKG Time: 1:46 AM Rate: 79 Rhythm: normal sinus rhythm with occasional PVC QRS Axis: normal Intervals: normal ST/T Wave abnormalities: Non-specific ST segment / T-wave changes, but no clear evidence of acute ischemia. Narrative Interpretation: no definitive evidence of acute ischemia; does not meet STEMI criteria.   ____________________________________________  RADIOLOGY I, Hinda Kehr, personally viewed and evaluated these images (plain radiographs) as part of my medical decision making, as well as reviewing the written report by the radiologist.  ED MD interpretation: Interstitial lung markings with no obvious acute abnormality  Official radiology report(s): DG Chest 2 View  Result Date: 02/09/2020 CLINICAL DATA:  Cough and congestion. EXAM: CHEST - 2 VIEW COMPARISON:  October 20, 2018 FINDINGS: Mild, diffuse chronic appearing increased interstitial lung markings are seen. Mild, stable linear scarring and/or atelectasis is noted within the left lung base. There is no evidence of a pleural effusion or pneumothorax. The heart size and mediastinal contours are within normal limits. There is moderate severity calcification of the aortic arch. The visualized skeletal structures are unremarkable. IMPRESSION: Chronic appearing increased interstitial lung markings with stable left basilar linear scarring and/or atelectasis.  Electronically Signed   By: Virgina Norfolk M.D.   On: 02/09/2020 02:30   CT Chest Wo Contrast  Result Date: 02/09/2020 CLINICAL DATA:  54 year old female with cough and congestion. EXAM: CT CHEST WITHOUT CONTRAST TECHNIQUE: Multidetector CT imaging of the chest was performed following the standard protocol without IV contrast. COMPARISON:  Chest radiographs earlier today.  Chest CTA 10/18/2014. FINDINGS: Cardiovascular: Calcified aortic atherosclerosis. Calcified coronary artery atherosclerosis. Vascular patency is not evaluated in the absence of IV contrast. No cardiomegaly or pericardial effusion. Mediastinum/Nodes: Negative.  No lymphadenopathy. Lungs/Pleura: Bilateral PE demonstrated in 2016. Confluent linear opacity in the left lower lobe could be atelectasis or chronic pulmonary scarring. Major airways remain patent. There is widely scattered bilateral patchy and indistinct pulmonary nodular opacity (series 3, image 76 bilaterally) with additional superimposed pulmonary ground-glass opacity or mosaic attenuation. No pleural effusion. Upper Abdomen: Questionable hepatomegaly but otherwise negative visible noncontrast liver, spleen, pancreas, right adrenal gland. Relatively low-density 23 mm left adrenal nodule, was not included on the 2016 CTA. Negative visible upper abdominal bowel. Musculoskeletal: Widespread thoracic spine ankylosis from flowing endplate osteophytes or syndesmophytes. Chronic right posterior 10th rib fracture with cortical thickening is new since 2016. No acute osseous abnormality identified. IMPRESSION: 1. Widely scattered patchy and indistinct nodular pulmonary opacity compatible with acute viral/atypical respiratory infection. No pleural effusion. Superimposed bilateral mosaic attenuation which can be related to small airway or small vessel disease (and history of 2016 PE). 2. A 2.3 cm left adrenal nodule is indeterminate on noncontrast CT but probably benign. Recommend follow-up  Adrenal protocol Abdomen CT (without and with contrast) in 1 year. This recommendation follows ACR consensus guidelines: Management of Incidental Adrenal Masses: A White Paper of the ACR Incidental Findings Committee. J Am Coll Radiol 2017;14:1038-1044. 3. Aortic Atherosclerosis (ICD10-I70.0). Calcified coronary artery atherosclerosis. Electronically Signed   By: Genevie Ann M.D.   On: 02/09/2020 06:22    ____________________________________________  PROCEDURES   Procedure(s) performed (including Critical Care):  .Critical Care Performed by: Hinda Kehr, MD Authorized by: Hinda Kehr, MD   Critical care provider statement:    Critical care time (minutes):  30   Critical care time was exclusive of:  Separately billable procedures and treating other patients   Critical care was necessary to treat or prevent imminent or life-threatening deterioration of the following conditions:  Respiratory failure   Critical care was time spent personally by me on the following activities:  Development of treatment plan with patient or surrogate, discussions with consultants, evaluation of patient's response to treatment, examination of patient, obtaining history from patient or surrogate, ordering and performing treatments and interventions, ordering and review of laboratory studies, ordering and review of radiographic studies, pulse oximetry, re-evaluation of patient's condition and review of old charts .1-3 Lead EKG Interpretation Performed by: Hinda Kehr, MD Authorized by: Hinda Kehr, MD     Interpretation: normal     ECG rate:  80   ECG rate assessment: normal     Ectopy: PVCs     Conduction: normal       ____________________________________________   INITIAL IMPRESSION / MDM / ASSESSMENT AND PLAN / ED COURSE  As part of my medical decision making, I reviewed the following data within the [facility] notes reviewed and incorporated, Labs reviewed , EKG  interpreted , Old chart reviewed, Radiograph reviewed , Discussed with admitting physician (Dr. Damita Dunnings) and Notes from prior ED visits   Differential diagnosis includes, but is not limited to, CHF exacerbation, pneumonia, COVID-19 or other respiratory viral infection, less likely PE or ACS.  The patient is on the cardiac monitor to evaluate for evidence of arrhythmia and/or significant heart rate changes.  Blood pressure is notable for substantial elevation with a systolic around 700 and a diastolic around 174.  However she says that she has not taken her blood pressure medicine since 7:00 this morning, almost 24 hours.  She is on at least 4 antihypertensives including losartan, amlodipine, clonidine, and metoprolol.  Her presentation is most consistent with CHF exacerbation.  I personally reviewed the patient's imaging and agree with the radiologist's interpretation that there is no obvious sign of acute or emergent abnormality, but the images somewhat limited by her body habitus as well as her chronic lung disease which could mask acute issues.  I think PE is very unlikely given no tachycardia and no chest pain and the fact she is compliant with her Eliquis.  She has had increased weight gain, dietary indiscretions, and bilateral leg swelling most consistent with CHF exacerbation.  That would also explain the cough and the feeling of "chest congestion".  In addition to giving her her regular doses of losartan 50 mg by mouth, amlodipine 5 mg by mouth, clonidine 0.2 mg by mouth, and metoprolol tartrate 50 mg by mouth, I am putting on nitroglycerin paste 1 inch.  I have started treatment for CHF with furosemide 40 mg IV.  Labs are notable for slight worsening of her chronic kidney disease with a low GFR of about 51.  Her potassium is slightly elevated at 5.2 but without concerning EKG findings and this will likely be assisted with the furosemide.  Respiratory viral panel is negative.  High-sensitivity  troponin is 14.  BNP is elevated at about 198.  Patient has a leukocytosis of 15 which is higher than usual although she typically has slightly elevated white blood cell count.  Given that the chest  x-ray was nondiagnostic, I will proceed with a CT chest without contrast given her low GFR and given the low probability that the patient has pulmonary emboli as described above.  This will help determine whether or not the patient needs treatment with antibiotics but I suspect she simply needs inpatient admission for CHF exacerbation given her hypoxemia and need for aggressive IV diuresis.  Clinical Course as of 02/09/20 3546  [facility] Feb 09, 2020  5681 CT Chest Wo Contrast Widespread atypical versus viral pneumonia pattern.  I will treat empirically with azithromycin 500 mg IV.  Consulting the hospitalist for admission.  Patient stable on supplemental oxygen by nasal cannula. [CF]  [phone] Discussed case by phone with Dr. Damita Dunnings with the hospitalist service.  She will pass along the information to the daytime admitting hospitalist. [CF]    Clinical Course User Index [CF] Hinda Kehr, MD    ____________________________________________  FINAL CLINICAL IMPRESSION(S) / ED DIAGNOSES  Final diagnoses:  Acute respiratory failure with hypoxemia (HCC)  Acute on chronic congestive heart failure, unspecified heart failure type ([place])  Uncontrolled hypertension  Chronic kidney disease, unspecified CKD stage  Sleep apnea, unspecified type  Morbid obesity ([place])  Current use of long term anticoagulation     MEDICATIONS GIVEN DURING THIS VISIT:  Medications  azithromycin (ZITHROMAX) 500 mg in sodium chloride 0.9 % 250 mL IVPB (500 mg Intravenous New Bag/Given 02/09/20 0636)  furosemide (LASIX) injection 40 mg (40 mg Intravenous Given 02/09/20 0525)  nitroGLYCERIN (NITROGLYN) 2 % ointment 1 inch (1 inch Topical Given 02/09/20 0528)  losartan (COZAAR) tablet 50 mg (50 mg Oral Given 02/09/20 0533)   amLODipine (NORVASC) tablet 5 mg (5 mg Oral Given 02/09/20 0533)  cloNIDine (CATAPRES) tablet 0.2 mg (0.2 mg Oral Given 02/09/20 0534)  metoprolol tartrate (LOPRESSOR) tablet 50 mg (50 mg Oral Given 02/09/20 0533)     ED Discharge Orders    None      *Please note:  ANTONETTE HENDRICKS was evaluated in Emergency Department on 02/09/2020 for the symptoms described in the history of present illness. She was evaluated in the context of the global COVID-19 pandemic, which necessitated consideration that the patient might be at risk for infection with the SARS-CoV-2 virus that causes COVID-19. Institutional protocols and algorithms that pertain to the evaluation of patients at risk for COVID-19 are in a state of rapid change based on information released by regulatory bodies including the CDC and federal and state organizations. These policies and algorithms were followed during the patient's care in the ED.  Some ED evaluations and interventions may be delayed as a result of limited staffing during and after the pandemic.*  Note:  This document was prepared using Dragon voice recognition software and may include unintentional dictation errors.   Hinda Kehr, MD 02/09/20 7001    Hinda Kehr, MD 02/09/20 [phone]

## 2020-02-09 NOTE — ED Triage Notes (Signed)
Patient with complaint of cough and congestion that started on Friday. Patient states that she has had pneumonia in the past and that she is worried that she maybe getting pneumonia again. Patient denies fever and muscle aches. Patient states that she has been feeling short of breath for months but that is worse today. Patient states that she has chronic leg swelling but that it has become worse today as well.

## 2020-02-09 NOTE — ED Notes (Signed)
Patient given her lunch tray. Patient sitting on side of stretcher to eat.

## 2020-02-09 NOTE — Progress Notes (Signed)
*  PRELIMINARY RESULTS* Echocardiogram 2D Echocardiogram has been performed.  Nicole Schmidt 02/09/2020, 11:27 AM

## 2020-02-09 NOTE — ED Notes (Signed)
PT at bedside.

## 2020-02-09 NOTE — ED Notes (Signed)
Deferring CBG check and insulin administration until lunch tray is available. Patient has a steady gait to and from room commode, but needs help getting on and off stretcher. Hospital bed has been ordered.

## 2020-02-09 NOTE — H&P (Signed)
History and Physical    Nicole Schmidt IRC:[id number] DOB: 1965/05/17 DOA: 02/09/2020  PCP: Lenard Simmer, MD Consultants:  [facility] Anon - cardiology; Cor[facility]an - oncology; [facility] - nephrology; Sood - pulmonology Patient coming from: Home - lives alone; NOK: Nicole Schmidt, Nicole Schmidt (cousins)  Chief Complaint: SOB  HPI: Nicole Schmidt is a 54 y.o. female with medical history significant of PE (2016); afib on Eliquis; morbid obesity (BMI 61.79); hypothyroidism; HTN; DM; OSA on CPAP (autopap); stage 3 CKD; chronic diastolic CHF; and remote breast cancer presenting with SOB.  Friday night, she had a dry cough and was around family.  She sneezed a lot and then felt congested and so stayed home after.  She could tell that she wasn't able to take a deep breath.  She has chronic DOE but usually recovers quickly.  Since Friday, she has been more SOB.  +LE edema, possibly mildly worse than usual.  She sleeps in a recliner at baseline.  She has gained weight, maybe 20 pounds this year from lack of mobility.   ED Course: Carryover, per Dr. Damita Dunnings:  54 yr old female with history of CHF, pulmonary emboli on Eliquis, morbid obesity, sleep apnea, and chronic kidney disease presenting with 4 days of SOB, orthopnea . O2 sat 87 on room air. CTA chest negative for PE.Suspect CHF exacerbation, possible pneumonia. Covid negative.  Review of Systems: As per HPI; otherwise review of systems reviewed and negative.   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 (HFpEF) heart failure with preserved ejection fraction ([place])    a. 10/2018 Echo: EF 60-65%, diast dysfxn, RVSP 50.7 mmHg, mildly dil LA.  Marland Kitchen Anemia   . Arthritis   . Breast cancer ([place]) 2014   right breast cancer  . Breast cancer of upper-inner quadrant of right female breast ([place])    Right breast invasive CA and DCIS  , 7 mm T1,N0,M0. Er/PR pos, her 2 negative.  Margins 1 mm.  . CHF (congestive heart failure) ([place])   . CKD (chronic kidney disease), stage III ([place])   . Diabetes mellitus without complication ([place])   . Essential hypertension   . Gout   . Hypothyroidism   . Menopause    age 67  . Morbid obesity ([place])   . Persistent atrial fibrillation ([place])    a. Dx 10/2018 in setting of PNA. CHA2DS2VASc = 4-->Eliquis; b. 11/2018 s/p successful DCCV.  Marland Kitchen Personal history of radiation therapy   . Pulmonary embolism ([place]) 10/2014   a. Chronic eliquis.  . Thyroid goiter     Past Surgical History:  Procedure Laterality Date  . BREAST BIOPSY Right 2014   breast ca  . BREAST EXCISIONAL BIOPSY Right 07/19/2012   breast ca  . BREAST SURGERY Right 2014   with sentinel node bx subareaolar duct excision  . CARDIOVERSION N/A 11/28/2018   Procedure: CARDIOVERSION;  Surgeon: Minna Merritts, MD;  Location: [facility] ORS;  Service: Cardiovascular;  Laterality: N/A;  . COLONOSCOPY WITH PROPOFOL N/A 01/30/2017   Procedure: COLONOSCOPY WITH PROPOFOL;  Surgeon: Christene Lye, MD;  Location: [facility] ENDOSCOPY;  Service: Endoscopy;  Laterality: N/A;    Social History   Socioeconomic History  . Marital status: Single    Spouse name: Not on file  . Number of children: Not on file  . Years of education: Not on file  .  Highest education level: Not on file  Occupational History  . Not on file  Tobacco Use  . Smoking status: Never Smoker  . Smokeless tobacco: Never Used  Vaping Use  . Vaping Use: Never used  Substance and Sexual Activity  . Alcohol use: No    Alcohol/week: 0.0 standard drinks  . Drug use: No  . Sexual activity: Not on file  Other Topics Concern  . Not on file  Social History Narrative   Lives in [place] by herself.  Does not routinely exercise.  Works full-time - on her feet all day at work.   Social Determinants of Health   Financial Resource Strain: Not on file  Food Insecurity:  Not on file  Transportation Needs: Not on file  Physical Activity: Not on file  Stress: Not on file  Social Connections: Not on file  Intimate Partner Violence: Not on file    No Known Allergies  Family History  Problem Relation Age of Onset  . Diabetes Father   . Hypertension Father   . Parkinson's disease Father   . Hypertension Mother   . Rheum arthritis Mother   . Atrial fibrillation Mother   . Heart failure Mother   . Heart attack Mother        died in her mid-70's.  . Colon cancer Cousin 62  . Breast cancer Neg Hx     Prior to Admission medications   Medication Sig Start Date End Date Taking? Authorizing Provider  acetaminophen (TYLENOL) 500 MG tablet Take 1,000 mg by mouth every 6 (six) hours as needed (for pain).   Yes [provider]  amLODipine (NORVASC) 5 MG tablet Take 5 mg by mouth 2 (two) times daily. 09/18/18  Yes [provider]  atorvastatin (LIPITOR) 20 MG tablet Take 20 mg by mouth at bedtime.    Yes [provider]  calcium carbonate (OSCAL) 1500 (600 Ca) MG TABS tablet Take 600 mg of elemental calcium by mouth daily.    Yes [provider]  Chlorpheniramine-Acetaminophen (CORICIDIN HBP COLD/FLU PO) Take 2 tablets by mouth 2 (two) times daily as needed.   Yes [provider]  Cholecalciferol (VITAMIN D3) 50000 units TABS Take 50,000 Units by mouth every Friday.    Yes [provider]  CINNAMON PO Take 1,000 mg by mouth daily.    Yes [provider]  cloNIDine (CATAPRES) 0.2 MG tablet Take 1 tablet (0.2 mg total) by mouth 2 (two) times daily. 10/21/18  Yes Nicole, Pete Pelt, MD  CRANBERRY CONCENTRATE PO Take 1 capsule by mouth daily.   Yes [provider]  Dulaglutide 1.5 MG/0.5ML SOPN Inject 1.5 mg into the skin every Monday. In the evening   Yes [provider]  ELIQUIS 5 MG TABS tablet TAKE 1 TABLET TWICE A DAY 12/23/19  Yes Corcoran, Melissa C, MD  folic acid (FOLVITE) 128 MCG tablet  Take 800 mcg by mouth daily.    Yes [provider]  furosemide (LASIX) 20 MG tablet Take 1 tablet (20 mg) by mouth once daily   Yes [provider]  KLOR-CON M20 20 MEQ tablet TAKE 1 TABLET DAILY 10/20/19  Yes Darylene Price A, FNP  levothyroxine (SYNTHROID, LEVOTHROID) 112 MCG tablet Take 112 mcg daily before breakfast by mouth.   Yes [provider]  losartan (COZAAR) 50 MG tablet Take 50 mg by mouth daily.   Yes [provider]  Melatonin 10 MG TABS Take 10 mg by mouth at bedtime.  Yes [provider]  metoprolol tartrate (LOPRESSOR) 50 MG tablet TAKE 1 TABLET TWICE A DAY 08/04/19  Yes Wellington Hampshire, MD  nystatin (MYCOSTATIN/NYSTOP) powder Apply 1 g topically 2 (two) times daily as needed (rash (skin folds)).   Yes [provider]  vitamin C (ASCORBIC ACID) 500 MG tablet Take 500 mg by mouth 2 (two) times daily.   Yes [provider]  albuterol (VENTOLIN HFA) 108 (90 Base) MCG/ACT inhaler Inhale 2 puffs into the lungs every 6 (six) hours as needed for wheezing or shortness of breath. Patient not taking: Reported on 02/09/2020    [provider]  fluticasone-salmeterol (ADVAIR HFA) 230-21 MCG/ACT inhaler Inhale 2 puffs into the lungs 2 (two) times daily as needed.  Patient not taking: Reported on 02/03/2020    [provider]    Physical Exam: Vitals:   02/09/20 0530 02/09/20 0533 02/09/20 0615 02/09/20 0630  BP: (!) 203/83 (!) 203/83  (!) 149/58  Pulse: 87 91 85 74  Resp:   14 13  Temp:      TempSrc:      SpO2: 99%  97% 97%  Weight:      Height:         . General:  Appears calm and comfortable and is in NAD, on 1L  O2 (not usually on home O2) . Eyes:  PERRL, EOMI, normal lids, iris . ENT:  grossly normal hearing, lips, mmm; + oral thrush . Neck:  no LAD, masses or thyromegaly . Cardiovascular:  RRR, no m/r/g. No LE edema.  Marland Kitchen Respiratory:   CTA bilaterally with no wheezes/rales/rhonchi.  Normal  respiratory effort. . Abdomen:  soft, NT, ND, NABS . Skin:  no rash or induration seen on limited exam . Musculoskeletal:  grossly normal tone BUE/BLE, good ROM, no bony abnormality . Lower extremity:  No LE edema.  Limited foot exam with no ulcerations.  2+ distal pulses. Marland Kitchen Psychiatric:  grossly normal mood and affect, speech fluent and appropriate, AOx3 . Neurologic:  CN 2-12 grossly intact, moves all extremities in coordinated fashion    Radiological Exams on Admission: Independently reviewed - see discussion in A/P where applicable  DG Chest 2 View  Result Date: 02/09/2020 CLINICAL DATA:  Cough and congestion. EXAM: CHEST - 2 VIEW COMPARISON:  October 20, 2018 FINDINGS: Mild, diffuse chronic appearing increased interstitial lung markings are seen. Mild, stable linear scarring and/or atelectasis is noted within the left lung base. There is no evidence of a pleural effusion or pneumothorax. The heart size and mediastinal contours are within normal limits. There is moderate severity calcification of the aortic arch. The visualized skeletal structures are unremarkable. IMPRESSION: Chronic appearing increased interstitial lung markings with stable left basilar linear scarring and/or atelectasis. Electronically Signed   By: Virgina Norfolk M.D.   On: 02/09/2020 02:30   CT Chest Wo Contrast  Result Date: 02/09/2020 CLINICAL DATA:  54 year old female with cough and congestion. EXAM: CT CHEST WITHOUT CONTRAST TECHNIQUE: Multidetector CT imaging of the chest was performed following the standard protocol without IV contrast. COMPARISON:  Chest radiographs earlier today.  Chest CTA 10/18/2014. FINDINGS: Cardiovascular: Calcified aortic atherosclerosis. Calcified coronary artery atherosclerosis. Vascular patency is not evaluated in the absence of IV contrast. No cardiomegaly or pericardial effusion. Mediastinum/Nodes: Negative.  No lymphadenopathy. Lungs/Pleura: Bilateral PE demonstrated in 2016.  Confluent linear opacity in the left lower lobe could be atelectasis or chronic pulmonary scarring. Major airways remain patent. There is widely scattered bilateral patchy and indistinct  pulmonary nodular opacity (series 3, image 76 bilaterally) with additional superimposed pulmonary ground-glass opacity or mosaic attenuation. No pleural effusion. Upper Abdomen: Questionable hepatomegaly but otherwise negative visible noncontrast liver, spleen, pancreas, right adrenal gland. Relatively low-density 23 mm left adrenal nodule, was not included on the 2016 CTA. Negative visible upper abdominal bowel. Musculoskeletal: Widespread thoracic spine ankylosis from flowing endplate osteophytes or syndesmophytes. Chronic right posterior 10th rib fracture with cortical thickening is new since 2016. No acute osseous abnormality identified. IMPRESSION: 1. Widely scattered patchy and indistinct nodular pulmonary opacity compatible with acute viral/atypical respiratory infection. No pleural effusion. Superimposed bilateral mosaic attenuation which can be related to small airway or small vessel disease (and history of 2016 PE). 2. A 2.3 cm left adrenal nodule is indeterminate on noncontrast CT but probably benign. Recommend follow-up Adrenal protocol Abdomen CT (without and with contrast) in 1 year. This recommendation follows ACR consensus guidelines: Management of Incidental Adrenal Masses: A White Paper of the ACR Incidental Findings Committee. J Am Coll Radiol 2017;14:1038-1044. 3. Aortic Atherosclerosis (ICD10-I70.0). Calcified coronary artery atherosclerosis. Electronically Signed   By: Genevie Ann M.D.   On: 02/09/2020 06:22    EKG: Independently reviewed.  NSR with rate 79; no evidence of acute ischemia   Labs on Admission: I have personally reviewed the available labs and imaging studies at the time of the admission.  Pertinent labs:   K+ 5.2 Glucose 128 BUN 44/Creatinine 1.87/GFR 31 - stable BNP 197.9; 156 on  10/30/18 HS troponin 14, 14 WBC 15.4 Hgb 11.7 COVID/flu negative   Assessment/Plan Active Problems:   CHF (congestive heart failure) (HCC)   Acute respiratory failure with hypoxia -Patient without usual home O2 requirement presenting with SOB, hypoxia to 87% on RA -She is currently in the mid 90s on 1L [place] O2 -This may be multifactorial in nature - she has known CHF, OSA, and is morbidly obese; she also appears to have an acute respiratory infection based on history and CT -For now, will admit due to new onset of hypoxic respiratory failure and follow -Will order viral respiratory panel -Patient is vaccinated and COVID negative (she is due for booster and would like to receive Pfizer booster while hospitalized, if possible)  Chronic diastolic CHF -Patient with known h/o chronic diastolic CHF presenting with worsening SOB and hypoxia -CXR not consistent with pulmonary edema -Mildly elevated BNP compared to baseline -There may be a component of acute failure superimposed on chronic -Will request repeat echocardiogram - last was in 10/2018 and showed preserved EF with diastolic pseudonormalization -CHF order set utilized -Was given Lasix 40 mg x 1 in ER and will repeat with 40 mg IV BID x 2 additional doses; consider resumption of home Lasix in the next 1-2 days -Continue [place] O2 for now -Stable kidney function at this time, will follow -Repeat EKG in AM -Negative troponin x 2; doubt ACS based on symptoms  HTN -Continue Norvasc, Catapres, Cozaar, Lopressor -Will also add prn hydralazine  HLD -Continue Lipitor -Check lipids  DM -Last A1c was 7.1 in 10/2018; will recheck -Hold Dulaglutide -Will cover with moderate-scale SSI for now  Afib -Rate controlled with Lopressor -Continue Eliquis  Hypothyroidism -Check TSH -Continue Synthroid at current dose for now  Thrush -Will add Nystatin  Stage 3b CKD -Appears to be stable at this time -Will follow with diuresis -If her  renal dysfunction progresses much further, she may need to hold Cozaar  OSA -Continue autopap  Breast cancer -in remission, last mammogram on 11/21/19 -  She is now having yearly surveillance  Adrenal nodule -Incidental finding on CT -Probably benign based on appearance -Needs 1-year imaging f/u (01/2021)  Morbid obesity -Body mass index is 61.79 kg/m..  -Weight loss should be encouraged -Outpatient PCP/bariatric medicine/bariatric surgery f/u encouraged     Note: This patient has been tested and is negative for the novel coronavirus COVID-19. The patient has been fully vaccinated against COVID-19.    DVT prophylaxis: Lovenox  Code Status:  DNR - confirmed with patient Family Communication: None present Disposition Plan:  The patient is from: home  Anticipated d/c is to: home, possible with [facility] services  Anticipated d/c date will depend on clinical response to treatment, likely 2-4 days  Patient is currently: acutely ill Consults called: Cardiology; [facility] team; PT Admission status: Admit - It is my clinical opinion that admission to INPATIENT is reasonable and necessary because this patient will require at least 2 midnights in the hospital to treat this condition based on the medical complexity of the problems presented.  Given the aforementioned information, the predictability of an adverse outcome is felt to be significant.    Karmen Bongo MD [facility]   How to contact the [facility] Attending or Consulting provider [place] or covering provider during after hours [place], for this patient?  1. Check the care team in [facility] and look for a) attending/consulting [facility] provider listed and b) the [facility] team listed 2. Log into [email or web address] and use [place]'s universal password to access. If you do not have the password, please contact the hospital operator. 3. Locate the [facility] provider you are looking for under [facility] and page to a number that you can be directly  reached. 4. If you still have difficulty reaching the provider, please page the [facility] (Director on Call) for the Hospitalists listed on amion for assistance.   02/09/2020, 7:24 AM

## 2020-02-10 DIAGNOSIS — Z9989 Dependence on other enabling machines and devices: Secondary | ICD-10-CM

## 2020-02-10 DIAGNOSIS — I10 Essential (primary) hypertension: Secondary | ICD-10-CM | POA: Diagnosis not present

## 2020-02-10 DIAGNOSIS — J9601 Acute respiratory failure with hypoxia: Secondary | ICD-10-CM | POA: Diagnosis not present

## 2020-02-10 DIAGNOSIS — G4733 Obstructive sleep apnea (adult) (pediatric): Secondary | ICD-10-CM

## 2020-02-10 DIAGNOSIS — I482 Chronic atrial fibrillation, unspecified: Secondary | ICD-10-CM | POA: Diagnosis not present

## 2020-02-10 LAB — CBC WITH DIFFERENTIAL/PLATELET
Abs Immature Granulocytes: 0.06 10*3/uL (ref 0.00–0.07)
Basophils Absolute: 0 10*3/uL (ref 0.0–0.1)
Basophils Relative: 0 %
Eosinophils Absolute: 0.4 10*3/uL (ref 0.0–0.5)
Eosinophils Relative: 3 %
HCT: 36.4 % (ref 36.0–46.0)
Hemoglobin: 10.9 g/dL — ABNORMAL LOW (ref 12.0–15.0)
Immature Granulocytes: 0 %
Lymphocytes Relative: 13 %
Lymphs Abs: 1.9 10*3/uL (ref 0.7–4.0)
MCH: 27.8 pg (ref 26.0–34.0)
MCHC: 29.9 g/dL — ABNORMAL LOW (ref 30.0–36.0)
MCV: 92.9 fL (ref 80.0–100.0)
Monocytes Absolute: 1.4 10*3/uL — ABNORMAL HIGH (ref 0.1–1.0)
Monocytes Relative: 10 %
Neutro Abs: 10.3 10*3/uL — ABNORMAL HIGH (ref 1.7–7.7)
Neutrophils Relative %: 74 %
Platelets: 226 10*3/uL (ref 150–400)
RBC: 3.92 MIL/uL (ref 3.87–5.11)
RDW: 14.6 % (ref 11.5–15.5)
WBC: 14.2 10*3/uL — ABNORMAL HIGH (ref 4.0–10.5)
nRBC: 0 % (ref 0.0–0.2)

## 2020-02-10 LAB — BASIC METABOLIC PANEL
Anion gap: 10 (ref 5–15)
BUN: 45 mg/dL — ABNORMAL HIGH (ref 6–20)
CO2: 29 mmol/L (ref 22–32)
Calcium: 8.7 mg/dL — ABNORMAL LOW (ref 8.9–10.3)
Chloride: 104 mmol/L (ref 98–111)
Creatinine, Ser: 2.11 mg/dL — ABNORMAL HIGH (ref 0.44–1.00)
GFR, Estimated: 27 mL/min — ABNORMAL LOW (ref >60)
Glucose, Bld: 176 mg/dL — ABNORMAL HIGH (ref 70–99)
Potassium: 4.7 mmol/L (ref 3.5–5.1)
Sodium: 143 mmol/L (ref 135–145)

## 2020-02-10 LAB — GLUCOSE, CAPILLARY
Glucose-Capillary: 106 mg/dL — ABNORMAL HIGH (ref 70–99)
Glucose-Capillary: 166 mg/dL — ABNORMAL HIGH (ref 70–99)
Glucose-Capillary: 106 mg/dL — ABNORMAL HIGH (ref 70–99)
Glucose-Capillary: 117 mg/dL — ABNORMAL HIGH (ref 70–99)

## 2020-02-10 LAB — PROCALCITONIN: Procalcitonin: <0.1 ng/mL

## 2020-02-10 MED ORDER — DM-GUAIFENESIN ER 30-600 MG PO TB12
1.0000 | ORAL_TABLET | Freq: Two times a day (BID) | ORAL | Status: DC
  Administered 2020-02-10 – 2020-02-18 (×17): 1 via ORAL
  Filled 2020-02-10 (×17): qty 1

## 2020-02-10 MED ORDER — HYDRALAZINE HCL 25 MG PO TABS
25.0000 mg | ORAL_TABLET | Freq: Four times a day (QID) | ORAL | Status: DC | PRN
  Administered 2020-02-12 – 2020-02-13 (×2): 25 mg via ORAL
  Filled 2020-02-10 (×3): qty 1

## 2020-02-10 MED ORDER — SODIUM CHLORIDE 0.9 % IV SOLN
1.0000 g | INTRAVENOUS | Status: DC
  Administered 2020-02-10: 1 g via INTRAVENOUS
  Filled 2020-02-10 (×2): qty 10

## 2020-02-10 MED ORDER — SODIUM CHLORIDE 0.9 % IV SOLN
500.0000 mg | INTRAVENOUS | Status: AC
  Administered 2020-02-11 – 2020-02-12 (×2): 500 mg via INTRAVENOUS
  Filled 2020-02-10 (×3): qty 500

## 2020-02-10 NOTE — Progress Notes (Signed)
PROGRESS NOTE    Nicole Schmidt   WLN:[id number]  DOB: 1965/06/29  PCP: Lenard Simmer, MD    DOA: 02/09/2020 LOS: 1   Brief Narrative   Per H&P by Dr. Lorin Mercy: "Nicole Schmidt is a 54 y.o. female with medical history significant of PE (2016); afib on Eliquis; morbid obesity (BMI 61.79); hypothyroidism; HTN; DM; OSA on CPAP (autopap); stage 3 CKD; chronic diastolic CHF; and remote breast cancer presenting with SOB.  Friday night, she had a dry cough and was around family.  She sneezed a lot and then felt congested and so stayed home after.  She could tell that she wasn't able to take a deep breath.  She has chronic DOE but usually recovers quickly.  Since Friday, she has been more SOB.  +LE edema, possibly mildly worse than usual.  She sleeps in a recliner at baseline.  She has gained weight, maybe 20 pounds this year from lack of mobility."   In the ED, O2 sat 87 on room air.  Covid and influenza negative.  BNP mildly elevated 197.9.  Leukocytosis 15.4k CT chest without contrast (due to renal function) showed Widely scattered patchy and indistinct nodular pulmonary opacity compatible with acute viral/atypical respiratory infection. No pleural effusion. Superimposed bilateral mosaic attenuation which can be related to small airway or small vessel disease (and history of 2016 PE).  Admitted for further evaluation and management of suspected CHF exacerbation, and likely atypical pneumonia.  Initially diuresed with IV Lasix, since stopped due to worsened renal function.  Started on empiric antibiotics for PNA vs bronchitis.  Echo showed EG 65-70%, severe LVH, grade II diastolic dysfunction, moderate LA dilation, mild RA dilation.     Assessment & Plan   Principal Problem:   Acute hypoxemic respiratory failure (HCC) Active Problems:   History of breast cancer   Chronic renal disease, stage III (HCC)   Essential hypertension   Chronic diastolic heart failure (HCC)   OSA on CPAP    Morbid obesity (HCC)   Atrial fibrillation, chronic (HCC)   Dyslipidemia   Diabetes mellitus type 2 in obese ([place])   Acquired hypothyroidism   Thrush, oral   Acute respiratory failure with hypoxia due to acute on chronic diastolic CHF and CA-Pneumonia - primary symptom of dyspnea on exertion above baseline, slightly worse edema, chest congestion.  Has hx of PE on Eliquis, so less likely PE. --Hold diuretics with worsening renal function --I/O's, daily weights --Start empiric antibiotics given leukocytosis, CT findings --Mucinex --IS and flutter --O2 as needed to keep sat >= 90%, wean as tolerated --V/Q scan tomorrow  Hypertension - Continue Norvasc, Catapres, Lopressor. Hold losartan with rising Cr.  PRN hydralazine.  Hyperlipidemia - Continue Lipitor.  Lipid panel with TG's 152 otherwise normal.  Type 2 Diabetes - last A1c 7.1% in 10/2018, improved with current A1c of 6.4%.  Hold Dulaglutide.  Moderate-scale Novolog.  Hx of A-fib - rate controlled.  Continue Lopressor and Eliquis.  Hypothyroidism - continue Synthroid.  TSH normal.  Thrush - Mycostatin   Stage 3b CKD - stable on admission.  Slight bump in Cr with IV diuretic.  Hold diuresis for now and monitor volume status. Hold losartan.  OSA - Continue CPAP/autopap  Hx of Breast cancer - in remission, last mammogram on 11/21/19.  Has ongoing yearly surveillance.  Morbid obesity - Body mass index is 62.67 kg/m. Complicates overall care and prognosis.  Recommend lifestyle modifications, consider bariatric referral.  Left Adrenal nodule - 2.3 cm,  seen on CT incidentally.  Likely benign based on appearance.   Recommendations is follow up CT scan with and without contrast in 1 year.   DVT prophylaxis:  apixaban (ELIQUIS) tablet 5 mg   Diet:  Diet Orders (From admission, onward)    Start     Ordered   02/09/20 1005  Diet Carb Modified Fluid consistency: Thin; Room service appropriate? Yes  Diet effective now        Question Answer Comment  Diet-HS Snack? Nothing   Calorie Level Medium 1600-2000   Fluid consistency: Thin   Room service appropriate? Yes      02/09/20 1006            Code Status: Full Code    Subjective 02/10/20    Pt reports this morning feeling little better.  Has not really been up moving around however, DOE being main issue.  Continues to have chest congestion.  No fever or chills.  Chest feels tight.     Disposition Plan & Communication   Status is: Inpatient  Inpatient status remains appropriate due to severity of illness requiring IV therapies and ongoing evaluation.  Requiring oxygen.  Dispo: The patient is from: Home              Anticipated d/c is to: Home              Anticipated d/c date is: 2 days              Medically stable for d/c: No   Consults, Procedures, Significant Events   Consultants:   None  Procedures:   Echo EF 65-70%, grade II diastolic dysfunction, severe LVH  Antimicrobials:  Anti-infectives (From admission, onward)   Start     Dose/Rate Route Frequency Ordered Stop   02/10/20 1900  azithromycin (ZITHROMAX) 500 mg in sodium chloride 0.9 % 250 mL IVPB        500 mg 250 mL/hr over 60 Minutes Intravenous Every 24 hours 02/10/20 1804 02/13/20 1859   02/10/20 1900  cefTRIAXone (ROCEPHIN) 1 g in sodium chloride 0.9 % 100 mL IVPB        1 g 200 mL/hr over 30 Minutes Intravenous Every 24 hours 02/10/20 1804     02/09/20 0630  azithromycin (ZITHROMAX) 500 mg in sodium chloride 0.9 % 250 mL IVPB        500 mg 250 mL/hr over 60 Minutes Intravenous  Once 02/09/20 0628 02/09/20 0950        Objective   Vitals:   02/10/20 0900 02/10/20 1149 02/10/20 1615 02/10/20 1930  BP: (!) 175/68 (!) 133/41 (!) 156/68 (!) 168/79  Pulse: 73 65 69 72  Resp: 18 18 19 18   Temp: 98.4 F (36.9 C) 98.3 F (36.8 C) 98.4 F (36.9 C) 98.4 F (36.9 C)  TempSrc: Oral Oral Oral Oral  SpO2:  99% 99% 98%  Weight: (!) 165.6 kg     Height: 5\' 4"  (1.626 m)        Intake/Output Summary (Last 24 hours) at 02/10/2020 2007 Last data filed at 02/10/2020 1902 Gross per 24 hour  Intake 18.11 ml  Output 600 ml  Net -581.89 ml   Filed Weights   02/09/20 0149 02/10/20 0900  Weight: (!) 163.3 kg (!) 165.6 kg    Physical Exam:  General exam: awake, alert, no acute distress, obese HEENT: clear conjunctiva, anicteric sclera, moist mucus membranes, hearing grossly normal  Respiratory system: diminished breath sounds, no wheezes, rales or rhonchi,  normal respiratory effort. Cardiovascular system: normal S1/S2, RRR, lower extremity edema.   Central nervous system: A&O x4. no gross focal neurologic deficits, normal speech Extremities: edema of lower extremities with mild venous stasis discoloration of distal LE's, no warmth or erythema Psychiatry: normal mood, congruent affect, judgement and insight appear normal  Labs   Data Reviewed: I have personally reviewed following labs and imaging studies  CBC: Recent Labs  Lab 02/09/20 0200 02/10/20 0320  WBC 15.4* 14.2*  NEUTROABS  --  10.3*  HGB 11.7* 10.9*  HCT 39.3 36.4  MCV 92.7 92.9  PLT 234 315   Basic Metabolic Panel: Recent Labs  Lab 02/09/20 0200 02/10/20 0320  NA 143 143  K 5.2* 4.7  CL 104 104  CO2 31 29  GLUCOSE 128* 176*  BUN 44* 45*  CREATININE 1.87* 2.11*  CALCIUM 9.3 8.7*   GFR: Estimated Creatinine Clearance: 46.6 mL/min (A) (by C-G formula based on SCr of 2.11 mg/dL (H)). Liver Function Tests: Recent Labs  Lab 02/09/20 0200  AST 19  ALT 21  ALKPHOS 146*  BILITOT 0.5  PROT 7.8  ALBUMIN 3.7   No results for input(s): LIPASE, AMYLASE in the last 168 hours. No results for input(s): AMMONIA in the last 168 hours. Coagulation Profile: No results for input(s): INR, PROTIME in the last 168 hours. Cardiac Enzymes: No results for input(s): CKTOTAL, CKMB, CKMBINDEX, TROPONINI in the last 168 hours. BNP (last 3 results) No results for input(s): PROBNP in the last  8760 hours. HbA1C: Recent Labs    02/09/20 1340  HGBA1C 6.4*   CBG: Recent Labs  Lab 02/09/20 1729 02/09/20 2259 02/10/20 0821 02/10/20 1150 02/10/20 1615  GLUCAP 91 111* 106* 166* 106*   Lipid Profile: Recent Labs    02/09/20 1340  CHOL 122  HDL 49  LDLCALC 42  TRIG 153*  CHOLHDL 2.5   Thyroid Function Tests: Recent Labs    02/09/20 1340  TSH 0.727   Anemia Panel: No results for input(s): VITAMINB12, FOLATE, FERRITIN, TIBC, IRON, RETICCTPCT in the last 72 hours. Sepsis Labs: Recent Labs  Lab 02/10/20 0320  PROCALCITON <0.10    Recent Results (from the past 240 hour(s))  Resp Panel by RT-PCR (Flu A&B, Covid) Nasopharyngeal Swab     Status: None   Collection Time: 02/09/20  2:00 AM   Specimen: Nasopharyngeal Swab; Nasopharyngeal(NP) swabs in vial transport medium  Result Value Ref Range Status   SARS Coronavirus 2 by RT PCR NEGATIVE NEGATIVE Final    Comment: (NOTE) SARS-CoV-2 target nucleic acids are NOT DETECTED.  The SARS-CoV-2 RNA is generally detectable in upper respiratory specimens during the acute phase of infection. The lowest concentration of SARS-CoV-2 viral copies this assay can detect is 138 copies/mL. A negative result does not preclude SARS-Cov-2 infection and should not be used as the sole basis for treatment or other patient management decisions. A negative result may occur with  improper specimen collection/handling, submission of specimen other than nasopharyngeal swab, presence of viral mutation(s) within the areas targeted by this assay, and inadequate number of viral copies(<138 copies/mL). A negative result must be combined with clinical observations, patient history, and epidemiological information. The expected result is Negative.  Fact Sheet for Patients:  EntrepreneurPulse.com.au  Fact Sheet for Healthcare Providers:  IncredibleEmployment.be  This test is no t yet approved or cleared by  the [place] FDA and  has been authorized for detection and/or diagnosis of SARS-CoV-2 by FDA under an Emergency Use Authorization (EUA). This  EUA will remain  in effect (meaning this test can be used) for the duration of the COVID-19 declaration under Section 564(b)(1) of the Act, 21 U.S.C.section 360bbb-3(b)(1), unless the authorization is terminated  or revoked sooner.       Influenza A by PCR NEGATIVE NEGATIVE Final   Influenza B by PCR NEGATIVE NEGATIVE Final    Comment: (NOTE) The Xpert Xpress SARS-CoV-2/FLU/RSV plus assay is intended as an aid in the diagnosis of influenza from Nasopharyngeal swab specimens and should not be used as a sole basis for treatment. Nasal washings and aspirates are unacceptable for Xpert Xpress SARS-CoV-2/FLU/RSV testing.  Fact Sheet for Patients: EntrepreneurPulse.com.au  Fact Sheet for Healthcare Providers: IncredibleEmployment.be  This test is not yet approved or cleared by the [place] FDA and has been authorized for detection and/or diagnosis of SARS-CoV-2 by FDA under an Emergency Use Authorization (EUA). This EUA will remain in effect (meaning this test can be used) for the duration of the COVID-19 declaration under Section 564(b)(1) of the Act, 21 U.S.C. section 360bbb-3(b)(1), unless the authorization is terminated or revoked.  Performed at [facility], [address] [place], [place] [address]       Imaging Studies   DG Chest 2 View  Result Date: 02/09/2020 CLINICAL DATA:  Cough and congestion. EXAM: CHEST - 2 VIEW COMPARISON:  October 20, 2018 FINDINGS: Mild, diffuse chronic appearing increased interstitial lung markings are seen. Mild, stable linear scarring and/or atelectasis is noted within the left lung base. There is no evidence of a pleural effusion or pneumothorax. The heart size and mediastinal contours are within normal limits. There is moderate severity  calcification of the aortic arch. The visualized skeletal structures are unremarkable. IMPRESSION: Chronic appearing increased interstitial lung markings with stable left basilar linear scarring and/or atelectasis. Electronically Signed   By: Virgina Norfolk M.D.   On: 02/09/2020 02:30   CT Chest Wo Contrast  Result Date: 02/09/2020 CLINICAL DATA:  54 year old female with cough and congestion. EXAM: CT CHEST WITHOUT CONTRAST TECHNIQUE: Multidetector CT imaging of the chest was performed following the standard protocol without IV contrast. COMPARISON:  Chest radiographs earlier today.  Chest CTA 10/18/2014. FINDINGS: Cardiovascular: Calcified aortic atherosclerosis. Calcified coronary artery atherosclerosis. Vascular patency is not evaluated in the absence of IV contrast. No cardiomegaly or pericardial effusion. Mediastinum/Nodes: Negative.  No lymphadenopathy. Lungs/Pleura: Bilateral PE demonstrated in 2016. Confluent linear opacity in the left lower lobe could be atelectasis or chronic pulmonary scarring. Major airways remain patent. There is widely scattered bilateral patchy and indistinct pulmonary nodular opacity (series 3, image 76 bilaterally) with additional superimposed pulmonary ground-glass opacity or mosaic attenuation. No pleural effusion. Upper Abdomen: Questionable hepatomegaly but otherwise negative visible noncontrast liver, spleen, pancreas, right adrenal gland. Relatively low-density 23 mm left adrenal nodule, was not included on the 2016 CTA. Negative visible upper abdominal bowel. Musculoskeletal: Widespread thoracic spine ankylosis from flowing endplate osteophytes or syndesmophytes. Chronic right posterior 10th rib fracture with cortical thickening is new since 2016. No acute osseous abnormality identified. IMPRESSION: 1. Widely scattered patchy and indistinct nodular pulmonary opacity compatible with acute viral/atypical respiratory infection. No pleural effusion. Superimposed bilateral  mosaic attenuation which can be related to small airway or small vessel disease (and history of 2016 PE). 2. A 2.3 cm left adrenal nodule is indeterminate on noncontrast CT but probably benign. Recommend follow-up Adrenal protocol Abdomen CT (without and with contrast) in 1 year. This recommendation follows ACR consensus guidelines: Management of Incidental Adrenal Masses: A White  Paper of the ACR Incidental Findings Committee. J Am Coll Radiol 2017;14:1038-1044. 3. Aortic Atherosclerosis (ICD10-I70.0). Calcified coronary artery atherosclerosis. Electronically Signed   By: Genevie Ann M.D.   On: 02/09/2020 06:22   ECHOCARDIOGRAM COMPLETE  Result Date: 02/09/2020    ECHOCARDIOGRAM REPORT   Patient Name:   Nicole Schmidt Date of Exam: 02/09/2020 Medical Rec #:  [id number]      Height:       64.0 in Accession #:    [id number]     Weight:       360.0 lb Date of Birth:  [birth year]       BSA:          2.512 m Patient Age:    75 years       BP:           157/61 mmHg Patient Gender: F              HR:           65 bpm. Exam Location:  [facility] Procedure: 2D Echo, Cardiac Doppler and Color Doppler Indications:     CHF- acute systolic 270.62  History:         Patient has prior history of Echocardiogram examinations, most                  recent 10/20/2018. Risk Factors:Hypertension. Pulmonary embolus.  Sonographer:     Sherrie Sport RDCS (AE) Referring Phys:  [id number] Karmen Bongo Diagnosing Phys: Harrell Gave End MD  Sonographer Comments: Technically difficult study due to poor echo windows, suboptimal apical window and suboptimal subcostal window. IMPRESSIONS  1. Left ventricular ejection fraction, by estimation, is 65 to 70%. The left ventricle has normal function. Left ventricular endocardial border not optimally defined to evaluate regional wall motion. There is severe left ventricular hypertrophy. Left ventricular diastolic parameters are consistent with Grade II diastolic dysfunction (pseudonormalization). Elevated left atrial  pressure.  2. Right ventricular systolic function is normal. The right ventricular size is normal.  3. Left atrial size was moderately dilated.  4. Right atrial size was mildly dilated.  5. The mitral valve is grossly normal. No evidence of mitral valve regurgitation.  6. The aortic valve was not well visualized. Aortic valve regurgitation is not visualized. No aortic stenosis is present. FINDINGS  Left Ventricle: Left ventricular ejection fraction, by estimation, is 65 to 70%. The left ventricle has normal function. Left ventricular endocardial border not optimally defined to evaluate regional wall motion. The left ventricular internal cavity size was normal in size. There is severe left ventricular hypertrophy. Left ventricular diastolic parameters are consistent with Grade II diastolic dysfunction (pseudonormalization). Elevated left atrial pressure. Right Ventricle: The right ventricular size is normal. No increase in right ventricular wall thickness. Right ventricular systolic function is normal. Left Atrium: Left atrial size was moderately dilated. Right Atrium: Right atrial size was mildly dilated. Pericardium: The pericardium was not well visualized. Mitral Valve: The mitral valve is grossly normal. No evidence of mitral valve regurgitation. Tricuspid Valve: The tricuspid valve is not well visualized. Tricuspid valve regurgitation is trivial. Aortic Valve: The aortic valve was not well visualized. Aortic valve regurgitation is not visualized. No aortic stenosis is present. Aortic valve mean gradient measures 9.0 mmHg. Aortic valve peak gradient measures 15.8 mmHg. Aortic valve area, by VTI measures 2.62 cm. Pulmonic Valve: The pulmonic valve was not well visualized. Pulmonic valve regurgitation is not visualized. No evidence of pulmonic stenosis. Aorta: The aortic root and ascending aorta  are structurally normal, with no evidence of dilitation. Pulmonary Artery: The pulmonary artery is not well seen.  IAS/Shunts: The interatrial septum was not well visualized.  LEFT VENTRICLE PLAX 2D LVIDd:         4.42 cm  Diastology LVIDs:         2.42 cm  LV e' medial:    5.77 cm/s LV PW:         1.71 cm  LV E/e' medial:  20.1 LV IVS:        1.60 cm  LV e' lateral:   11.10 cm/s LVOT diam:     2.10 cm  LV E/e' lateral: 10.5 LV SV:         107 LV SV Index:   43 LVOT Area:     3.46 cm  RIGHT VENTRICLE RV Basal diam:  3.42 cm RV S prime:     13.70 cm/s TAPSE (M-mode): 4.4 cm LEFT ATRIUM            Index       RIGHT ATRIUM           Index LA diam:      4.20 cm  1.67 cm/m  RA Area:     18.00 cm LA Vol (A2C): 71.6 ml  28.51 ml/m RA Volume:   40.40 ml  16.09 ml/m LA Vol (A4C): 107.0 ml 42.60 ml/m  AORTIC VALVE                    PULMONIC VALVE AV Area (Vmax):    2.33 cm     PV Vmax:        0.92 m/s AV Area (Vmean):   2.32 cm     PV Peak grad:   3.4 mmHg AV Area (VTI):     2.62 cm     RVOT Peak grad: 4 mmHg AV Vmax:           199.00 cm/s AV Vmean:          140.000 cm/s AV VTI:            0.409 m AV Peak Grad:      15.8 mmHg AV Mean Grad:      9.0 mmHg LVOT Vmax:         134.00 cm/s LVOT Vmean:        93.800 cm/s LVOT VTI:          0.309 m LVOT/AV VTI ratio: 0.76  AORTA Ao Root diam: 2.97 cm MITRAL VALVE                TRICUSPID VALVE MV Area (PHT): 3.20 cm     TR Peak grad:   16.2 mmHg MV Decel Time: 237 msec     TR Vmax:        201.00 cm/s MV E velocity: 116.00 cm/s MV A velocity: 79.10 cm/s   SHUNTS MV E/A ratio:  1.47         Systemic VTI:  0.31 m                             Systemic Diam: 2.10 cm Nelva Bush MD Electronically signed by Nelva Bush MD Signature Date/Time: 02/09/2020/1:01:59 PM    Final      Medications   Scheduled Meds: . amLODipine  5 mg Oral BID  . apixaban  5 mg Oral BID  . atorvastatin  20 mg Oral QHS  .  cloNIDine  0.2 mg Oral BID  . dextromethorphan-guaiFENesin  1 tablet Oral BID  . folic acid  0,165 mcg Oral Daily  . insulin aspart  0-15 Units Subcutaneous TID WC  .  levothyroxine  112 mcg Oral QAC breakfast  . losartan  50 mg Oral Daily  . metoprolol tartrate  50 mg Oral BID  . nystatin  5 mL Oral QID  . sodium chloride flush  3 mL Intravenous Q12H   Continuous Infusions: . sodium chloride 250 mL (02/10/20 1951)  . azithromycin 250 mL/hr at 02/10/20 1956  . cefTRIAXone (ROCEPHIN)  IV 1 g (02/10/20 1846)       LOS: 1 day    Time spent: 30 minutes with >50% spent at bedside and in coordination of care.    Ezekiel Slocumb, DO [facility]  02/10/2020, 8:07 PM    If 7PM-7AM, please contact night-coverage. How to contact the [facility] Attending or Consulting provider [place] or covering provider during after hours [place], for this patient?    1. Check the care team in [facility] and look for a) attending/consulting [facility] provider listed and b) the [facility] team listed 2. Log into [email or web address] and use [place]'s universal password to access. If you do not have the password, please contact the hospital operator. 3. Locate the [facility] provider you are looking for under [facility] and page to a number that you can be directly reached. 4. If you still have difficulty reaching the provider, please page the [facility] (Director on Call) for the Hospitalists listed on amion for assistance.

## 2020-02-10 NOTE — ED Notes (Signed)
Pt up and ambulatory to restroom, on RA. On return to room, pt saturations low 80's. Placed back on 2L o2, saturations improved to mid 90's after deep breathing exercises.

## 2020-02-10 NOTE — Progress Notes (Signed)
Patient is stable on 2 Liters of oxygen at this time.

## 2020-02-10 NOTE — Hospital Course (Addendum)
Per H&P by Dr. Lorin Mercy: "Nicole Schmidt is a 54 y.o. female with medical history significant of PE (2016); afib on Eliquis; morbid obesity (BMI 61.79); hypothyroidism; HTN; DM; OSA on CPAP (autopap); stage 3 CKD; chronic diastolic CHF; and remote breast cancer presenting with SOB.  Friday night, she had a dry cough and was around family.  She sneezed a lot and then felt congested and so stayed home after.  She could tell that she wasn't able to take a deep breath.  She has chronic DOE but usually recovers quickly.  Since Friday, she has been more SOB.  +LE edema, possibly mildly worse than usual.  She sleeps in a recliner at baseline.  She has gained weight, maybe 20 pounds this year from lack of mobility."   In the ED, O2 sat 87 on room air.  Covid and influenza negative.  BNP mildly elevated 197.9.  Leukocytosis 15.4k CT chest without contrast (due to renal function) showed Widely scattered patchy and indistinct nodular pulmonary opacity compatible with acute viral/atypical respiratory infection. No pleural effusion. Superimposed bilateral mosaic attenuation which can be related to small airway or small vessel disease (and history of 2016 PE).  Admitted for further evaluation and management of suspected CHF exacerbation, and likely atypical pneumonia.  Initially diuresed with IV Lasix, since stopped due to worsened renal function.  Started on empiric antibiotics for PNA vs bronchitis.  Echo showed EG 65-70%, severe LVH, grade II diastolic dysfunction, moderate LA dilation, mild RA dilation.

## 2020-02-10 NOTE — ED Notes (Signed)
Pt changed over to CPAP, noted that her O2 saturations are from 86-90%, notified RT, says she will come to eval the patient.

## 2020-02-11 ENCOUNTER — Other Ambulatory Visit: Payer: BC Managed Care – PPO

## 2020-02-11 DIAGNOSIS — J9601 Acute respiratory failure with hypoxia: Secondary | ICD-10-CM | POA: Diagnosis not present

## 2020-02-11 LAB — GLUCOSE, CAPILLARY
Glucose-Capillary: 108 mg/dL — ABNORMAL HIGH (ref 70–99)
Glucose-Capillary: 138 mg/dL — ABNORMAL HIGH (ref 70–99)
Glucose-Capillary: 105 mg/dL — ABNORMAL HIGH (ref 70–99)
Glucose-Capillary: 186 mg/dL — ABNORMAL HIGH (ref 70–99)

## 2020-02-11 LAB — RESPIRATORY PANEL BY PCR

## 2020-02-11 LAB — BASIC METABOLIC PANEL
Anion gap: 10 (ref 5–15)
BUN: 51 mg/dL — ABNORMAL HIGH (ref 6–20)
CO2: 31 mmol/L (ref 22–32)
Calcium: 8.6 mg/dL — ABNORMAL LOW (ref 8.9–10.3)
Chloride: 103 mmol/L (ref 98–111)
Creatinine, Ser: 2.18 mg/dL — ABNORMAL HIGH (ref 0.44–1.00)
GFR, Estimated: 26 mL/min — ABNORMAL LOW (ref >60)
Glucose, Bld: 103 mg/dL — ABNORMAL HIGH (ref 70–99)
Potassium: 5 mmol/L (ref 3.5–5.1)
Sodium: 144 mmol/L (ref 135–145)

## 2020-02-11 LAB — FIBRIN DERIVATIVES D-DIMER (ARMC ONLY): Fibrin derivatives D-dimer (ARMC): 669.65 ng/mL (FEU) — ABNORMAL HIGH (ref 0.00–499.00)

## 2020-02-11 LAB — CBC WITH DIFFERENTIAL/PLATELET
Abs Immature Granulocytes: 0.07 10*3/uL (ref 0.00–0.07)
Basophils Absolute: 0 10*3/uL (ref 0.0–0.1)
Basophils Relative: 0 %
Eosinophils Absolute: 0.4 10*3/uL (ref 0.0–0.5)
Eosinophils Relative: 3 %
HCT: 35.9 % — ABNORMAL LOW (ref 36.0–46.0)
Hemoglobin: 10.6 g/dL — ABNORMAL LOW (ref 12.0–15.0)
Immature Granulocytes: 1 %
Lymphocytes Relative: 17 %
Lymphs Abs: 2.1 10*3/uL (ref 0.7–4.0)
MCH: 28 pg (ref 26.0–34.0)
MCHC: 29.5 g/dL — ABNORMAL LOW (ref 30.0–36.0)
MCV: 95 fL (ref 80.0–100.0)
Monocytes Absolute: 1.3 10*3/uL — ABNORMAL HIGH (ref 0.1–1.0)
Monocytes Relative: 11 %
Neutro Abs: 8.5 10*3/uL — ABNORMAL HIGH (ref 1.7–7.7)
Neutrophils Relative %: 68 %
Platelets: 209 10*3/uL (ref 150–400)
RBC: 3.78 MIL/uL — ABNORMAL LOW (ref 3.87–5.11)
RDW: 14.6 % (ref 11.5–15.5)
WBC: 12.5 10*3/uL — ABNORMAL HIGH (ref 4.0–10.5)
nRBC: 0 % (ref 0.0–0.2)

## 2020-02-11 LAB — HIV ANTIBODY (ROUTINE TESTING W REFLEX): HIV Screen 4th Generation wRfx: NONREACTIVE

## 2020-02-11 MED ORDER — IPRATROPIUM-ALBUTEROL 0.5-2.5 (3) MG/3ML IN SOLN
3.0000 mL | Freq: Four times a day (QID) | RESPIRATORY_TRACT | Status: DC
  Administered 2020-02-11 – 2020-02-12 (×2): 3 mL via RESPIRATORY_TRACT
  Filled 2020-02-11 (×2): qty 3

## 2020-02-11 MED ORDER — SODIUM CHLORIDE 0.9 % IV SOLN
2.0000 g | INTRAVENOUS | Status: DC
  Administered 2020-02-11 – 2020-02-14 (×4): 2 g via INTRAVENOUS
  Filled 2020-02-11 (×4): qty 2

## 2020-02-11 NOTE — TOC Initial Note (Signed)
Transition of Care [facility]) - Initial/Assessment Note    Patient Details  Name: Nicole Schmidt MRN: [id number] Date of Birth: 1965/06/01  Transition of Care [facility]) CM/SW Contact:    Kerin Salen, RN Phone Number: 02/11/2020, 12:53 PM  Clinical Narrative:  Patient alert and oriented x4, very pleasant, states she lives alone, drives and able to perform ADL's independently. No problems with getting medications, uses [facility] for urgent care meds and [facility] for chronic meds. Does her own shopping and cooking. No assisted devices needed, although from her deceased Mother's hospital discharges did keep walker, wheelchair, bedside commode and shower chair. Never used equipment just kept it in case. Will continue to track and assist for discharge needs.                 Expected Discharge Plan: Home/Self Care Barriers to Discharge: Continued Medical Work up   Patient Goals and CMS Choice Patient states their goals for this hospitalization and ongoing recovery are:: To return home.   Choice offered to / list presented to : NA  Expected Discharge Plan and Services Expected Discharge Plan: Home/Self Care In-house Referral: NA Discharge Planning Services: NA Post Acute Care Choice: NA Living arrangements for the past 2 months: Single Family Home                 DME Arranged: N/A DME Agency: NA       HH Arranged: NA HH Agency: NA        Prior Living Arrangements/Services Living arrangements for the past 2 months: Single Family Home Lives with:: Self Patient language and need for interpreter reviewed:: Yes Do you feel safe going back to the place where you live?: Yes      Need for Family Participation in Patient Care: No (Comment) Care giver support system in place?: No (comment)   Criminal Activity/Legal Involvement Pertinent to Current Situation/Hospitalization: No - Comment as needed  Activities of Daily Living Home Assistive Devices/Equipment: CPAP ADL Screening  (condition at time of admission) Patient's cognitive ability adequate to safely complete daily activities?: Yes Is the patient deaf or have difficulty hearing?: No Does the patient have difficulty seeing, even when wearing glasses/contacts?: No Does the patient have difficulty concentrating, remembering, or making decisions?: No Patient able to express need for assistance with ADLs?: Yes Does the patient have difficulty dressing or bathing?: No Independently performs ADLs?: Yes (appropriate for developmental age) Does the patient have difficulty walking or climbing stairs?: No Weakness of Legs: None Weakness of Arms/Hands: None  Permission Sought/Granted Permission sought to share information with : Case Manager                Emotional Assessment Appearance:: Appears stated age Attitude/Demeanor/Rapport: Engaged,Self-Confident Affect (typically observed): Accepting,Appropriate Orientation: : Oriented to Self,Oriented to Place,Oriented to  Time,Oriented to Situation Alcohol / Substance Use: Not Applicable Psych Involvement: No (comment)  Admission diagnosis:  Morbid obesity ([place]) [E66.01] CHF (congestive heart failure) (HCC) [I50.9] Uncontrolled hypertension [I10] Current use of long term anticoagulation [Z79.01] Acute respiratory failure with hypoxemia (HCC) [J96.01] Sleep apnea, unspecified type [G47.30] Chronic kidney disease, unspecified CKD stage [N18.9] Acute on chronic congestive heart failure, unspecified heart failure type [facility]) [I50.9] Patient Active Problem List   Diagnosis Date Noted  . Atrial fibrillation, chronic ([place]) 02/09/2020  . Dyslipidemia 02/09/2020  . Diabetes mellitus type 2 in obese ([place]) 02/09/2020  . Acquired hypothyroidism 02/09/2020  . Thrush, oral 02/09/2020  . Morbid obesity ([place]) 02/03/2020  . Chronic anticoagulation  12/30/2019  . OSA on CPAP 08/04/2019  . Atrial fibrillation with RVR ([place]) 10/18/2018  . Acute hypoxemic respiratory failure  ([place]) 03/29/2018  . Chronic diastolic heart failure ([place]) 02/04/2018  . Lymphedema 02/04/2018  . Essential hypertension 12/09/2017  . Long term (current) use of aromatase inhibitors 11/17/2017  . Elevated alkaline phosphatase level 05/20/2017  . Goals of care, counseling/discussion 05/14/2017  . Chronic renal disease, stage III ([place]) 07/08/2015  . Iron deficiency anemia 07/08/2015  . Acute renal failure superimposed on stage 3 chronic kidney disease ([place]) 10/22/2014  . History of breast cancer 10/22/2014  . Elevated troponin 10/22/2014  . Pulmonary emboli ([place]) 10/18/2014  . Pulmonary embolism ([place]) 10/18/2014  . Candidiasis of breast 07/06/2014  . Malignant neoplasm of right female breast ([place]) 07/24/2012   PCP:  Lenard Simmer, MD Pharmacy:   [facility] [address] [place] - [place] [address] [place] [address] Phone: [phone] Fax: [phone]  [facility] [place], [place] [address] Phone: [phone] Fax: [phone]  [facility] [place] [address] [place] [address] Phone: [phone] Fax: [phone]     Social Determinants of Health ([place]) Interventions    Readmission Risk Interventions Readmission Risk Prevention Plan 02/11/2020 10/19/2018  Transportation Screening Complete Complete  PCP or Specialist Appt within 3-5 Days Not Complete -  Not Complete comments To be scheduled when discharged. -  [place] or Home Care Consult Complete -  Social Work Consult for [place] Planning/Counseling Complete Patient refused  Palliative Care Screening Complete Not Applicable  Medication Review Press photographer) Complete Complete  Some recent data might be hidden

## 2020-02-11 NOTE — Progress Notes (Signed)
Physical Therapy Treatment Patient Details Name: Nicole Schmidt MRN: [id number] DOB: 07-09-1965 Today's Date: 02/11/2020    History of Present Illness Pt is a 54 y.o. female with medical history significant of PE (2016); afib on Eliquis; morbid obesity (BMI 61.79); hypothyroidism; HTN; DM; OSA on CPAP; stage 3 CKD; chronic diastolic CHF; and remote breast cancer presenting with SOB.  MD assessment includes: CHF and acute respiratory failure with hypoxia.    PT Comments    Pt was long sitting in bed upon arriving on 2 L o2 [place]. She agrees to PT session and is cooperative and pleasant throughout. Eager to go home when cleared. " I just dont want to have to go home on O2. Pt was easily able to exit bed, stand and ambulate with supervision. Has desaturation on 2 L o2 to 84%. INcrtaesed O2 to 3 L and pt quickly recovers to > 88%. She ambulated > 200 ft without LOB. Once returned to room and repositioned in bed, sao2 increased to 99%. Discontinued O2 and pt able to maintain > 90%. Pt may need O2 at DC to use with exertion. At rest able to maintain safe saturation levels without O2. Recommend OP PT at DC to improve strength/endurance.    Follow Up Recommendations  Outpatient PT     Equipment Recommendations  None recommended by PT    Recommendations for Other Services       Precautions / Restrictions Precautions Precautions: Fall Restrictions Weight Bearing Restrictions: No    Mobility  Bed Mobility Overal bed mobility: Modified Independent     Transfers Overall transfer level: Modified independent Equipment used: None   Ambulation/Gait Ambulation/Gait assistance: Supervision Gait Distance (Feet): 200 Feet Assistive device: None   Gait velocity: decreased   General Gait Details: Pt was able to ambulate > 200 ft however did require increased O2 demand form 2 L to 3 L due to desaturation to 84%. recovers quickly to >88% on 3 L. Did not endorse fatigue or lightheadedness  throughout      Balance Overall balance assessment: No apparent balance deficits (not formally assessed)       Cognition Arousal/Alertness: Awake/alert Behavior During Therapy: WFL for tasks assessed/performed Overall Cognitive Status: Within Functional Limits for tasks assessed      General Comments: Pt is A and O x 4 and very cooperative and pleasant       Pertinent Vitals/Pain Pain Assessment: No/denies pain           PT Goals (current goals can now be found in the care plan section) Acute Rehab PT Goals Patient Stated Goal: home Progress towards PT goals: Progressing toward goals    Frequency    Min 2X/week      PT Plan Current plan remains appropriate       AM-PAC PT "6 Clicks" Mobility   Outcome Measure  Help needed turning from your back to your side while in a flat bed without using bedrails?: None Help needed moving from lying on your back to sitting on the side of a flat bed without using bedrails?: None Help needed moving to and from a bed to a chair (including a wheelchair)?: None Help needed standing up from a chair using your arms (e.g., wheelchair or bedside chair)?: A Little Help needed to walk in hospital room?: A Little Help needed climbing 3-5 steps with a railing? : A Little 6 Click Score: 21    End of Session Equipment Utilized During Treatment: Gait belt;Oxygen Activity Tolerance: Patient  tolerated treatment well Patient left: in bed;with call bell/phone within reach Nurse Communication: Mobility status;Other (comment) (o2 demand) PT Visit Diagnosis: Muscle weakness (generalized) (M62.81);Difficulty in walking, not elsewhere classified (R26.2)     Time: 3007-6226 PT Time Calculation (min) (ACUTE ONLY): 24 min  Charges:  $Gait Training: 8-22 mins $Therapeutic Activity: 8-22 mins                     Julaine Fusi PTA 02/11/20, 1:44 PM

## 2020-02-11 NOTE — Progress Notes (Signed)
PROGRESS NOTE    Nicole Schmidt   TMH:[id number]  DOB: [birth year]  PCP: Lenard Simmer, MD    DOA: 02/09/2020 LOS: 2   Brief Narrative   Per H&P by Dr. Lorin Mercy: "Nicole Schmidt is a 54 y.o. female with medical history significant of PE (2016); afib on Eliquis; morbid obesity (BMI 61.79); hypothyroidism; HTN; DM; OSA on CPAP (autopap); stage 3 CKD; chronic diastolic CHF; and remote breast cancer presenting with SOB.  Friday night, she had a dry cough and was around family.  She sneezed a lot and then felt congested and so stayed home after.  She could tell that she wasn't able to take a deep breath.  She has chronic DOE but usually recovers quickly.  Since Friday, she has been more SOB.  +LE edema, possibly mildly worse than usual.  She sleeps in a recliner at baseline.  She has gained weight, maybe 20 pounds this year from lack of mobility."   In the ED, O2 sat 87 on room air.  Covid and influenza negative.  BNP mildly elevated 197.9.  Leukocytosis 15.4k CT chest without contrast (due to renal function) showed Widely scattered patchy and indistinct nodular pulmonary opacity compatible with acute viral/atypical respiratory infection. No pleural effusion. Superimposed bilateral mosaic attenuation which can be related to small airway or small vessel disease (and history of 2016 PE).  Admitted for further evaluation and management of suspected CHF exacerbation, and likely atypical pneumonia.  Initially diuresed with IV Lasix, since stopped due to worsened renal function.  Started on empiric antibiotics for PNA vs bronchitis.  Echo showed EG 65-70%, severe LVH, grade II diastolic dysfunction, moderate LA dilation, mild RA dilation.     Assessment & Plan   Principal Problem:   Acute hypoxemic respiratory failure (HCC) Active Problems:   History of breast cancer   Chronic renal disease, stage III (HCC)   Essential hypertension   Chronic diastolic heart failure (HCC)   OSA on CPAP    Morbid obesity (HCC)   Atrial fibrillation, chronic (HCC)   Dyslipidemia   Diabetes mellitus type 2 in obese ([place])   Acquired hypothyroidism   Thrush, oral   Acute respiratory failure with hypoxia due to Pneumonia  - primary symptom of dyspnea on exertion above baseline, slightly worse edema, chest congestion.  Has hx of PE on Eliquis, so less likely PE.  --documented O2 sat of 87% on presentation. --CT chest showed "Widely scattered patchy and indistinct nodular pulmonary opacity compatible with acute viral/atypical respiratory infection. No pleural effusion." --BNP 197, which is similar to prior.  Not showing signs of fluid overload, more PNA. --started on empiric abx on admission Plan: --cont ceftriaxone and azithromycin for now --obtain RVP --Hold diuretics due to Cr worsening and pt not showing signs of pulm congestion --start DuoNeb QID  Hypertension  - cont home amlodipine, clonidine, metop --Hold home losartan due to worsening Cr --PRN hydralazine.  Hyperlipidemia - Continue Lipitor.  Lipid panel with TG's 152 otherwise normal.  Type 2 Diabetes - last A1c 7.1% in 10/2018, improved with current A1c of 6.4%.  Hold Dulaglutide.  Moderate-scale Novolog.  Hx of PE on Eliquis --cont home Eliquis  Hx of A-fib on Eliquis - rate controlled.   --cont home metop and eliquis  Hypothyroidism  - continue Synthroid.    Thrush - Mycostatin   Stage 3b CKD - stable on admission.  Slight bump in Cr with IV diuretic.  Hold diuresis for now and monitor volume status.  Hold losartan.  OSA - Continue CPAP/autopap  Hx of Breast cancer - in remission, last mammogram on 11/21/19.  Has ongoing yearly surveillance.  Morbid obesity - Body mass index is 62.48 kg/m. Complicates overall care and prognosis.  Recommend lifestyle modifications, consider bariatric referral.  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  Likely benign based on appearance.   Recommendations is  follow up CT scan with and without contrast in 1 year.   DVT prophylaxis:  apixaban (ELIQUIS) tablet 5 mg   Diet:  Diet Orders (From admission, onward)    Start     Ordered   02/09/20 1005  Diet Carb Modified Fluid consistency: Thin; Room service appropriate? Yes  Diet effective now       Question Answer Comment  Diet-HS Snack? Nothing   Calorie Level Medium 1600-2000   Fluid consistency: Thin   Room service appropriate? Yes      02/09/20 1006            Code Status: Full Code    Subjective 02/11/20    Pt reported not feeling much different.  Never had significant dyspnea but just felt she couldn't take a deep breath.  Had congestion and cough.  Felt like sputum is starting to loosen up.   Disposition Plan & Communication   DVT prophylaxis: [id number] Code Status: Full code  Family Communication:  Status is: inpatient Dispo:   The patient is from: home Anticipated d/c is to: home Anticipated d/c date is: 1-2 days Patient currently is not medically stable to d/c due to: new O2, on IV abx   Consults, Procedures, Significant Events   Consultants:   None  Procedures:   Echo EF 65-70%, grade II diastolic dysfunction, severe LVH  Antimicrobials:  Anti-infectives (From admission, onward)   Start     Dose/Rate Route Frequency Ordered Stop   02/11/20 2000  cefTRIAXone (ROCEPHIN) 2 g in sodium chloride 0.9 % 100 mL IVPB        2 g 200 mL/hr over 30 Minutes Intravenous Every 24 hours 02/11/20 1343     02/10/20 1900  azithromycin (ZITHROMAX) 500 mg in sodium chloride 0.9 % 250 mL IVPB        500 mg 250 mL/hr over 60 Minutes Intravenous Every 24 hours 02/10/20 1804 02/13/20 1859   02/10/20 1900  cefTRIAXone (ROCEPHIN) 1 g in sodium chloride 0.9 % 100 mL IVPB  Status:  Discontinued        1 g 200 mL/hr over 30 Minutes Intravenous Every 24 hours 02/10/20 1804 02/11/20 1343   02/09/20 0630  azithromycin (ZITHROMAX) 500 mg in sodium chloride 0.9 % 250 mL IVPB         500 mg 250 mL/hr over 60 Minutes Intravenous  Once 02/09/20 0628 02/09/20 0950        Objective   Vitals:   02/11/20 0500 02/11/20 0737 02/11/20 1144 02/11/20 1620  BP:  (!) 153/71 (!) 164/56 (!) 149/63  Pulse:  79 60 62  Resp:      Temp:  98.5 F (36.9 C) 98.4 F (36.9 C) 98 F (36.7 C)  TempSrc:  Oral Oral Oral  SpO2:  96% 94% 100%  Weight: (!) 165.1 kg     Height:        Intake/Output Summary (Last 24 hours) at 02/11/2020 1807 Last data filed at 02/11/2020 1757 Gross per 24 hour  Intake 869.59 ml  Output 2850 ml  Net -1980.41 ml   [employer]  02/10/20 0900 02/10/20 2315 02/11/20 0500  Weight: (!) 165.6 kg (!) 165.1 kg (!) 165.1 kg    Physical Exam:  Constitutional: NAD, AAOx3 HEENT: conjunctivae and lids normal, EOMI CV: No cyanosis.   RESP: reduced air movement, on 2L Extremities: 1+ pitting edema in BLE SKIN: warm, dry and intact Neuro: II - XII grossly intact.   Psych: Normal mood and affect.  Appropriate judgement and reason   Labs   Data Reviewed: I have personally reviewed following labs and imaging studies  CBC: Recent Labs  Lab 02/09/20 0200 02/10/20 0320 02/11/20 0522  WBC 15.4* 14.2* 12.5*  NEUTROABS  --  10.3* 8.5*  HGB 11.7* 10.9* 10.6*  HCT 39.3 36.4 35.9*  MCV 92.7 92.9 95.0  PLT 234 226 480   Basic Metabolic Panel: Recent Labs  Lab 02/09/20 0200 02/10/20 0320 02/11/20 0522  NA 143 143 144  K 5.2* 4.7 5.0  CL 104 104 103  CO2 31 29 31   GLUCOSE 128* 176* 103*  BUN 44* 45* 51*  CREATININE 1.87* 2.11* 2.18*  CALCIUM 9.3 8.7* 8.6*   GFR: Estimated Creatinine Clearance: 45 mL/min (A) (by C-G formula based on SCr of 2.18 mg/dL (H)). Liver Function Tests: Recent Labs  Lab 02/09/20 0200  AST 19  ALT 21  ALKPHOS 146*  BILITOT 0.5  PROT 7.8  ALBUMIN 3.7   No results for input(s): LIPASE, AMYLASE in the last 168 hours. No results for input(s): AMMONIA in the last 168 hours. Coagulation Profile: No results for  input(s): INR, PROTIME in the last 168 hours. Cardiac Enzymes: No results for input(s): CKTOTAL, CKMB, CKMBINDEX, TROPONINI in the last 168 hours. BNP (last 3 results) No results for input(s): PROBNP in the last 8760 hours. HbA1C: Recent Labs    02/09/20 1340  HGBA1C 6.4*   CBG: Recent Labs  Lab 02/10/20 1615 02/10/20 2147 02/11/20 0738 02/11/20 1145 02/11/20 1621  GLUCAP 106* 117* 108* 138* 105*   Lipid Profile: Recent Labs    02/09/20 1340  CHOL 122  HDL 49  LDLCALC 42  TRIG 153*  CHOLHDL 2.5   Thyroid Function Tests: Recent Labs    02/09/20 1340  TSH 0.727   Anemia Panel: No results for input(s): VITAMINB12, FOLATE, FERRITIN, TIBC, IRON, RETICCTPCT in the last 72 hours. Sepsis Labs: Recent Labs  Lab 02/10/20 0320  PROCALCITON <0.10    Recent Results (from the past 240 hour(s))  Resp Panel by RT-PCR (Flu A&B, Covid) Nasopharyngeal Swab     Status: None   Collection Time: 02/09/20  2:00 AM   Specimen: Nasopharyngeal Swab; Nasopharyngeal(NP) swabs in vial transport medium  Result Value Ref Range Status   SARS Coronavirus 2 by RT PCR NEGATIVE NEGATIVE Final    Comment: (NOTE) SARS-CoV-2 target nucleic acids are NOT DETECTED.  The SARS-CoV-2 RNA is generally detectable in upper respiratory specimens during the acute phase of infection. The lowest concentration of SARS-CoV-2 viral copies this assay can detect is 138 copies/mL. A negative result does not preclude SARS-Cov-2 infection and should not be used as the sole basis for treatment or other patient management decisions. A negative result may occur with  improper specimen collection/handling, submission of specimen other than nasopharyngeal swab, presence of viral mutation(s) within the areas targeted by this assay, and inadequate number of viral copies(<138 copies/mL). A negative result must be combined with clinical observations, patient history, and epidemiological information. The expected result  is Negative.  Fact Sheet for Patients:  EntrepreneurPulse.com.au  Fact Sheet for  Healthcare Providers:  IncredibleEmployment.be  This test is no t yet approved or cleared by the [place] and  has been authorized for detection and/or diagnosis of SARS-CoV-2 by FDA under an Emergency Use Authorization (EUA). This EUA will remain  in effect (meaning this test can be used) for the duration of the COVID-19 declaration under Section 564(b)(1) of the Act, 21 U.S.C.section 360bbb-3(b)(1), unless the authorization is terminated  or revoked sooner.       Influenza A by PCR NEGATIVE NEGATIVE Final   Influenza B by PCR NEGATIVE NEGATIVE Final    Comment: (NOTE) The Xpert Xpress SARS-CoV-2/FLU/RSV plus assay is intended as an aid in the diagnosis of influenza from Nasopharyngeal swab specimens and should not be used as a sole basis for treatment. Nasal washings and aspirates are unacceptable for Xpert Xpress SARS-CoV-2/FLU/RSV testing.  Fact Sheet for Patients: EntrepreneurPulse.com.au  Fact Sheet for Healthcare Providers: IncredibleEmployment.be  This test is not yet approved or cleared by the [place] FDA and has been authorized for detection and/or diagnosis of SARS-CoV-2 by FDA under an Emergency Use Authorization (EUA). This EUA will remain in effect (meaning this test can be used) for the duration of the COVID-19 declaration under Section 564(b)(1) of the Act, 21 U.S.C. section 360bbb-3(b)(1), unless the authorization is terminated or revoked.  Performed at [facility], [address] [place], [place] [address]       Imaging Studies   No results found.   Medications   Scheduled Meds: . amLODipine  5 mg Oral BID  . apixaban  5 mg Oral BID  . atorvastatin  20 mg Oral QHS  . cloNIDine  0.2 mg Oral BID  . dextromethorphan-guaiFENesin  1 tablet Oral BID  . folic acid  7,711  mcg Oral Daily  . insulin aspart  0-15 Units Subcutaneous TID WC  . ipratropium-albuterol  3 mL Nebulization QID  . levothyroxine  112 mcg Oral QAC breakfast  . metoprolol tartrate  50 mg Oral BID  . nystatin  5 mL Oral QID  . sodium chloride flush  3 mL Intravenous Q12H   Continuous Infusions: . sodium chloride Stopped (02/10/20 2056)  . azithromycin Stopped (02/10/20 2056)  . cefTRIAXone (ROCEPHIN)  IV         LOS: 2 days     Enzo Bi, md [facility]  02/11/2020, 6:07 PM

## 2020-02-12 DIAGNOSIS — J9601 Acute respiratory failure with hypoxia: Secondary | ICD-10-CM | POA: Diagnosis not present

## 2020-02-12 LAB — GLUCOSE, CAPILLARY
Glucose-Capillary: 122 mg/dL — ABNORMAL HIGH (ref 70–99)
Glucose-Capillary: 145 mg/dL — ABNORMAL HIGH (ref 70–99)
Glucose-Capillary: 169 mg/dL — ABNORMAL HIGH (ref 70–99)
Glucose-Capillary: 130 mg/dL — ABNORMAL HIGH (ref 70–99)

## 2020-02-12 LAB — CBC
HCT: 34.2 % — ABNORMAL LOW (ref 36.0–46.0)
Hemoglobin: 10.1 g/dL — ABNORMAL LOW (ref 12.0–15.0)
MCH: 27.7 pg (ref 26.0–34.0)
MCHC: 29.5 g/dL — ABNORMAL LOW (ref 30.0–36.0)
MCV: 94 fL (ref 80.0–100.0)
Platelets: 206 10*3/uL (ref 150–400)
RBC: 3.64 MIL/uL — ABNORMAL LOW (ref 3.87–5.11)
RDW: 14.3 % (ref 11.5–15.5)
WBC: 11.4 10*3/uL — ABNORMAL HIGH (ref 4.0–10.5)
nRBC: 0 % (ref 0.0–0.2)

## 2020-02-12 LAB — BASIC METABOLIC PANEL
Anion gap: 10 (ref 5–15)
BUN: 57 mg/dL — ABNORMAL HIGH (ref 6–20)
CO2: 28 mmol/L (ref 22–32)
Calcium: 8.3 mg/dL — ABNORMAL LOW (ref 8.9–10.3)
Chloride: 105 mmol/L (ref 98–111)
Creatinine, Ser: 2.05 mg/dL — ABNORMAL HIGH (ref 0.44–1.00)
GFR, Estimated: 28 mL/min — ABNORMAL LOW (ref >60)
Glucose, Bld: 155 mg/dL — ABNORMAL HIGH (ref 70–99)
Potassium: 4.6 mmol/L (ref 3.5–5.1)
Sodium: 143 mmol/L (ref 135–145)

## 2020-02-12 LAB — MAGNESIUM: Magnesium: 2.3 mg/dL (ref 1.7–2.4)

## 2020-02-12 MED ORDER — IPRATROPIUM-ALBUTEROL 0.5-2.5 (3) MG/3ML IN SOLN
3.0000 mL | Freq: Three times a day (TID) | RESPIRATORY_TRACT | Status: DC
  Administered 2020-02-12 – 2020-02-13 (×3): 3 mL via RESPIRATORY_TRACT
  Filled 2020-02-12 (×3): qty 3

## 2020-02-12 NOTE — Progress Notes (Signed)
Report called to 2c nurse. Patient to be moved to room 204. On 02 at 2l. No distress noted.

## 2020-02-12 NOTE — Progress Notes (Signed)
Pt does not want to use a CPAP.

## 2020-02-12 NOTE — Progress Notes (Signed)
PROGRESS NOTE    Nicole Schmidt   QJJ:[id number]  DOB: May 02, 1965  PCP: Lenard Simmer, MD    DOA: 02/09/2020 LOS: 3   Brief Narrative   Per H&P by Dr. Lorin Mercy: "Nicole Schmidt is a 54 y.o. female with medical history significant of PE (2016); afib on Eliquis; morbid obesity (BMI 61.79); hypothyroidism; HTN; DM; OSA on CPAP (autopap); stage 3 CKD; chronic diastolic CHF; and remote breast cancer presenting with SOB.  Friday night, she had a dry cough and was around family.  She sneezed a lot and then felt congested and so stayed home after.  She could tell that she wasn't able to take a deep breath.  She has chronic DOE but usually recovers quickly.  Since Friday, she has been more SOB.  +LE edema, possibly mildly worse than usual.  She sleeps in a recliner at baseline.  She has gained weight, maybe 20 pounds this year from lack of mobility."   In the ED, O2 sat 87 on room air.  Covid and influenza negative.  BNP mildly elevated 197.9.  Leukocytosis 15.4k CT chest without contrast (due to renal function) showed Widely scattered patchy and indistinct nodular pulmonary opacity compatible with acute viral/atypical respiratory infection. No pleural effusion. Superimposed bilateral mosaic attenuation which can be related to small airway or small vessel disease (and history of 2016 PE).  Admitted for further evaluation and management of suspected CHF exacerbation, and likely atypical pneumonia.  Initially diuresed with IV Lasix, since stopped due to worsened renal function.  Started on empiric antibiotics for PNA vs bronchitis.  Echo showed EG 65-70%, severe LVH, grade II diastolic dysfunction, moderate LA dilation, mild RA dilation.     Assessment & Plan   Principal Problem:   Acute hypoxemic respiratory failure (HCC) Active Problems:   History of breast cancer   Chronic renal disease, stage III (HCC)   Essential hypertension   Chronic diastolic heart failure (HCC)   OSA on CPAP    Morbid obesity (HCC)   Atrial fibrillation, chronic (HCC)   Dyslipidemia   Diabetes mellitus type 2 in obese ([place])   Acquired hypothyroidism   Thrush, oral   Acute respiratory failure with hypoxia due to Pneumonia  - primary symptom of dyspnea on exertion above baseline, slightly worse edema, chest congestion.  Has hx of PE on Eliquis, so less likely PE.  --documented O2 sat of 87% on presentation. --CT chest showed "Widely scattered patchy and indistinct nodular pulmonary opacity compatible with acute viral/atypical respiratory infection. No pleural effusion."  COVID neg, RVP neg. --BNP 197, which is similar to prior.  Not showing signs of fluid overload, more PNA. --started on empiric abx on admission Plan: --cont ceftriaxone and azithromycin --Hold diuretics due to Cr worsening and pt not showing signs of pulm congestion --cont scheduled DuoNeb --Continue supplemental O2 to keep sats >=92%, wean as tolerated  Hypertension  - on home amlodipine, clonidine, metop and losartan --cont home regimen except for losartan due to more elevated Cr --PRN hydralazine.  Hyperlipidemia  --Lipid panel with TG's 152 otherwise normal. --cont home statin  Type 2 Diabetes - last A1c 7.1% in 10/2018, improved with current A1c of 6.4%.   --Hold Dulaglutide.  --cont SSI  Hx of PE on Eliquis --cont home Eliquis  Hx of A-fib on Eliquis - rate controlled.   --cont home metop and eliquis  Hypothyroidism  - continue Synthroid.    Thrush - Mycostatin   Stage 3b CKD - stable on  admission.  Slight bump in Cr with IV diuretic.  Hold diuresis for now and monitor volume status. --Hold home losartan for now  OSA  - Continue CPAP/autopap  Hx of Breast cancer - in remission, last mammogram on 11/21/19.  Has ongoing yearly surveillance.  Morbid obesity - Body mass index is 64.14 kg/m. Complicates overall care and prognosis.  Recommend lifestyle modifications, consider bariatric  referral.  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  Likely benign based on appearance.   Recommendations is follow up CT scan with and without contrast in 1 year.   DVT prophylaxis:  apixaban (ELIQUIS) tablet 5 mg   Diet:  Diet Orders (From admission, onward)    Start     Ordered   02/09/20 1005  Diet Carb Modified Fluid consistency: Thin; Room service appropriate? Yes  Diet effective now       Question Answer Comment  Diet-HS Snack? Nothing   Calorie Level Medium 1600-2000   Fluid consistency: Thin   Room service appropriate? Yes      02/09/20 1006            Code Status: Full Code    Subjective 02/12/20    Pt reported feeling and breathing somewhat better.  Normal oral intake and urination.   Disposition Plan & Communication   DVT prophylaxis: MO:QHUTMLY Code Status: Full code  Family Communication:  Status is: inpatient Dispo:   The patient is from: home Anticipated d/c is to: home Anticipated d/c date is: 1-2 days Patient currently is not medically stable to d/c due to: new O2, on IV abx   Consults, Procedures, Significant Events   Consultants:   None  Procedures:   Echo EF 65-70%, grade II diastolic dysfunction, severe LVH  Antimicrobials:  Anti-infectives (From admission, onward)   Start     Dose/Rate Route Frequency Ordered Stop   02/11/20 2000  cefTRIAXone (ROCEPHIN) 2 g in sodium chloride 0.9 % 100 mL IVPB        2 g 200 mL/hr over 30 Minutes Intravenous Every 24 hours 02/11/20 1343     02/10/20 1900  azithromycin (ZITHROMAX) 500 mg in sodium chloride 0.9 % 250 mL IVPB        500 mg 250 mL/hr over 60 Minutes Intravenous Every 24 hours 02/10/20 1804 02/13/20 1859   02/10/20 1900  cefTRIAXone (ROCEPHIN) 1 g in sodium chloride 0.9 % 100 mL IVPB  Status:  Discontinued        1 g 200 mL/hr over 30 Minutes Intravenous Every 24 hours 02/10/20 1804 02/11/20 1343   02/09/20 0630  azithromycin (ZITHROMAX) 500 mg in sodium chloride 0.9 %  250 mL IVPB        500 mg 250 mL/hr over 60 Minutes Intravenous  Once 02/09/20 0628 02/09/20 0950        Objective   Vitals:   02/12/20 0749 02/12/20 1152 02/12/20 1324 02/12/20 1515  BP:  (!) 155/71  (!) 147/79  Pulse:  70  74  Resp:  18  18  Temp:  98.1 F (36.7 C)  98.3 F (36.8 C)  TempSrc:  Oral  Oral  SpO2: 94% 96% 91% 92%  Weight:      Height:        Intake/Output Summary (Last 24 hours) at 02/12/2020 1715 Last data filed at 02/12/2020 1518 Gross per 24 hour  Intake 721.74 ml  Output 3250 ml  Net -2528.26 ml   Filed Weights   02/10/20 2315 02/11/20  0500 02/12/20 0108  Weight: (!) 165.1 kg (!) 165.1 kg (!) 169.5 kg    Physical Exam:  Constitutional: NAD, AAOx3 HEENT: conjunctivae and lids normal, EOMI CV: No cyanosis.   RESP: clear, reduced air movement, on 2L Extremities: chronic edema in BLE SKIN: warm, dry and intact Neuro: II - XII grossly intact.   Psych: Normal mood and affect.  Appropriate judgement and reason   Labs   Data Reviewed: I have personally reviewed following labs and imaging studies  CBC: Recent Labs  Lab 02/09/20 0200 02/10/20 0320 02/11/20 0522 02/12/20 0529  WBC 15.4* 14.2* 12.5* 11.4*  NEUTROABS  --  10.3* 8.5*  --   HGB 11.7* 10.9* 10.6* 10.1*  HCT 39.3 36.4 35.9* 34.2*  MCV 92.7 92.9 95.0 94.0  PLT 234 226 209 235   Basic Metabolic Panel: Recent Labs  Lab 02/09/20 0200 02/10/20 0320 02/11/20 0522 02/12/20 0529  NA 143 143 144 143  K 5.2* 4.7 5.0 4.6  CL 104 104 103 105  CO2 31 29 31 28   GLUCOSE 128* 176* 103* 155*  BUN 44* 45* 51* 57*  CREATININE 1.87* 2.11* 2.18* 2.05*  CALCIUM 9.3 8.7* 8.6* 8.3*  MG  --   --   --  2.3   GFR: Estimated Creatinine Clearance: 48.7 mL/min (A) (by C-G formula based on SCr of 2.05 mg/dL (H)). Liver Function Tests: Recent Labs  Lab 02/09/20 0200  AST 19  ALT 21  ALKPHOS 146*  BILITOT 0.5  PROT 7.8  ALBUMIN 3.7   No results for input(s): LIPASE, AMYLASE in the last  168 hours. No results for input(s): AMMONIA in the last 168 hours. Coagulation Profile: No results for input(s): INR, PROTIME in the last 168 hours. Cardiac Enzymes: No results for input(s): CKTOTAL, CKMB, CKMBINDEX, TROPONINI in the last 168 hours. BNP (last 3 results) No results for input(s): PROBNP in the last 8760 hours. HbA1C: No results for input(s): HGBA1C in the last 72 hours. CBG: Recent Labs  Lab 02/11/20 1621 02/11/20 2124 02/12/20 0740 02/12/20 1149 02/12/20 1625  GLUCAP 105* 186* 122* 145* 169*   Lipid Profile: No results for input(s): CHOL, HDL, LDLCALC, TRIG, CHOLHDL, LDLDIRECT in the last 72 hours. Thyroid Function Tests: No results for input(s): TSH, T4TOTAL, FREET4, T3FREE, THYROIDAB in the last 72 hours. Anemia Panel: No results for input(s): VITAMINB12, FOLATE, FERRITIN, TIBC, IRON, RETICCTPCT in the last 72 hours. Sepsis Labs: Recent Labs  Lab 02/10/20 0320  PROCALCITON <0.10    Recent Results (from the past 240 hour(s))  Resp Panel by RT-PCR (Flu A&B, Covid) Nasopharyngeal Swab     Status: None   Collection Time: 02/09/20  2:00 AM   Specimen: Nasopharyngeal Swab; Nasopharyngeal(NP) swabs in vial transport medium  Result Value Ref Range Status   SARS Coronavirus 2 by RT PCR NEGATIVE NEGATIVE Final    Comment: (NOTE) SARS-CoV-2 target nucleic acids are NOT DETECTED.  The SARS-CoV-2 RNA is generally detectable in upper respiratory specimens during the acute phase of infection. The lowest concentration of SARS-CoV-2 viral copies this assay can detect is 138 copies/mL. A negative result does not preclude SARS-Cov-2 infection and should not be used as the sole basis for treatment or other patient management decisions. A negative result may occur with  improper specimen collection/handling, submission of specimen other than nasopharyngeal swab, presence of viral mutation(s) within the areas targeted by this assay, and inadequate number of  viral copies(<138 copies/mL). A negative result must be combined with clinical observations, patient  history, and epidemiological information. The expected result is Negative.  Fact Sheet for Patients:  EntrepreneurPulse.com.au  Fact Sheet for Healthcare Providers:  IncredibleEmployment.be  This test is no t yet approved or cleared by the [place] FDA and  has been authorized for detection and/or diagnosis of SARS-CoV-2 by FDA under an Emergency Use Authorization (EUA). This EUA will remain  in effect (meaning this test can be used) for the duration of the COVID-19 declaration under Section 564(b)(1) of the Act, 21 U.S.C.section 360bbb-3(b)(1), unless the authorization is terminated  or revoked sooner.       Influenza A by PCR NEGATIVE NEGATIVE Final   Influenza B by PCR NEGATIVE NEGATIVE Final    Comment: (NOTE) The Xpert Xpress SARS-CoV-2/FLU/RSV plus assay is intended as an aid in the diagnosis of influenza from Nasopharyngeal swab specimens and should not be used as a sole basis for treatment. Nasal washings and aspirates are unacceptable for Xpert Xpress SARS-CoV-2/FLU/RSV testing.  Fact Sheet for Patients: EntrepreneurPulse.com.au  Fact Sheet for Healthcare Providers: IncredibleEmployment.be  This test is not yet approved or cleared by the [place] FDA and has been authorized for detection and/or diagnosis of SARS-CoV-2 by FDA under an Emergency Use Authorization (EUA). This EUA will remain in effect (meaning this test can be used) for the duration of the COVID-19 declaration under Section 564(b)(1) of the Act, 21 U.S.C. section 360bbb-3(b)(1), unless the authorization is terminated or revoked.  Performed at [facility], [address] [place], [place] [address]   Respiratory (~20 pathogens) panel by PCR     Status: None   Collection Time: 02/11/20 12:20 PM   Specimen:  Nasopharyngeal Swab; Respiratory  Result Value Ref Range Status   Adenovirus NOT DETECTED NOT DETECTED Final   Coronavirus 229E NOT DETECTED NOT DETECTED Final    Comment: (NOTE) The Coronavirus on the Respiratory Panel, DOES NOT test for the novel  Coronavirus (2019 nCoV)    Coronavirus HKU1 NOT DETECTED NOT DETECTED Final   Coronavirus NL63 NOT DETECTED NOT DETECTED Final   Coronavirus OC43 NOT DETECTED NOT DETECTED Final   Metapneumovirus NOT DETECTED NOT DETECTED Final   Rhinovirus / Enterovirus NOT DETECTED NOT DETECTED Final   Influenza A NOT DETECTED NOT DETECTED Final   Influenza B NOT DETECTED NOT DETECTED Final   Parainfluenza Virus 1 NOT DETECTED NOT DETECTED Final   Parainfluenza Virus 2 NOT DETECTED NOT DETECTED Final   Parainfluenza Virus 3 NOT DETECTED NOT DETECTED Final   Parainfluenza Virus 4 NOT DETECTED NOT DETECTED Final   Respiratory Syncytial Virus NOT DETECTED NOT DETECTED Final   Bordetella pertussis NOT DETECTED NOT DETECTED Final   Bordetella Parapertussis NOT DETECTED NOT DETECTED Final   Chlamydophila pneumoniae NOT DETECTED NOT DETECTED Final   Mycoplasma pneumoniae NOT DETECTED NOT DETECTED Final    Comment: Performed at [facility], [place]. [address], [place], [place] [address]      Imaging Studies   No results found.   Medications   Scheduled Meds: . amLODipine  5 mg Oral BID  . apixaban  5 mg Oral BID  . atorvastatin  20 mg Oral QHS  . cloNIDine  0.2 mg Oral BID  . dextromethorphan-guaiFENesin  1 tablet Oral BID  . folic acid  5,427 mcg Oral Daily  . insulin aspart  0-15 Units Subcutaneous TID WC  . ipratropium-albuterol  3 mL Nebulization TID  . levothyroxine  112 mcg Oral QAC breakfast  . metoprolol tartrate  50 mg Oral  BID  . nystatin  5 mL Oral QID  . sodium chloride flush  3 mL Intravenous Q12H   Continuous Infusions: . sodium chloride Stopped (02/10/20 2056)  . azithromycin 500 mg (02/11/20 2147)  . cefTRIAXone (ROCEPHIN)   IV 2 g (02/11/20 2055)       LOS: 3 days     Enzo Bi, md [facility]  02/12/2020, 5:15 PM

## 2020-02-13 DIAGNOSIS — J9601 Acute respiratory failure with hypoxia: Secondary | ICD-10-CM | POA: Diagnosis not present

## 2020-02-13 LAB — GLUCOSE, CAPILLARY
Glucose-Capillary: 131 mg/dL — ABNORMAL HIGH (ref 70–99)
Glucose-Capillary: 146 mg/dL — ABNORMAL HIGH (ref 70–99)
Glucose-Capillary: 135 mg/dL — ABNORMAL HIGH (ref 70–99)
Glucose-Capillary: 104 mg/dL — ABNORMAL HIGH (ref 70–99)

## 2020-02-13 LAB — BASIC METABOLIC PANEL
Anion gap: 8 (ref 5–15)
BUN: 56 mg/dL — ABNORMAL HIGH (ref 6–20)
CO2: 31 mmol/L (ref 22–32)
Calcium: 8.5 mg/dL — ABNORMAL LOW (ref 8.9–10.3)
Chloride: 106 mmol/L (ref 98–111)
Creatinine, Ser: 1.94 mg/dL — ABNORMAL HIGH (ref 0.44–1.00)
GFR, Estimated: 30 mL/min — ABNORMAL LOW (ref >60)
Glucose, Bld: 116 mg/dL — ABNORMAL HIGH (ref 70–99)
Potassium: 4.9 mmol/L (ref 3.5–5.1)
Sodium: 145 mmol/L (ref 135–145)

## 2020-02-13 LAB — CBC
HCT: 34.4 % — ABNORMAL LOW (ref 36.0–46.0)
Hemoglobin: 10.3 g/dL — ABNORMAL LOW (ref 12.0–15.0)
MCH: 28.1 pg (ref 26.0–34.0)
MCHC: 29.9 g/dL — ABNORMAL LOW (ref 30.0–36.0)
MCV: 93.7 fL (ref 80.0–100.0)
Platelets: 218 10*3/uL (ref 150–400)
RBC: 3.67 MIL/uL — ABNORMAL LOW (ref 3.87–5.11)
RDW: 14.2 % (ref 11.5–15.5)
WBC: 12.6 10*3/uL — ABNORMAL HIGH (ref 4.0–10.5)
nRBC: 0 % (ref 0.0–0.2)

## 2020-02-13 LAB — MAGNESIUM: Magnesium: 2.3 mg/dL (ref 1.7–2.4)

## 2020-02-13 MED ORDER — PNEUMOCOCCAL VAC POLYVALENT 25 MCG/0.5ML IJ INJ
0.5000 mL | INJECTION | INTRAMUSCULAR | Status: AC
  Administered 2020-02-16: 0.5 mL via INTRAMUSCULAR
  Filled 2020-02-13: qty 0.5

## 2020-02-13 MED ORDER — IPRATROPIUM-ALBUTEROL 0.5-2.5 (3) MG/3ML IN SOLN
3.0000 mL | Freq: Two times a day (BID) | RESPIRATORY_TRACT | Status: DC
  Administered 2020-02-13 – 2020-02-18 (×9): 3 mL via RESPIRATORY_TRACT
  Filled 2020-02-13 (×10): qty 3

## 2020-02-13 NOTE — Progress Notes (Signed)
PT Cancellation Note  Patient Details Name: Nicole Schmidt MRN: [id number] DOB: May 01, 1965   Cancelled Treatment:    Reason Eval/Treat Not Completed: Other (comment) (Third attempt to see patient this morning. Patient with nursing and unavailable for treatment at this time. Will attempt again at later time/date as available.)  Janna Arch, PT, DPT   02/13/2020, 12:32 PM

## 2020-02-13 NOTE — Progress Notes (Signed)
PROGRESS NOTE    Nicole Schmidt   WFU:[id number]  DOB: 1965-03-21  PCP: Lenard Simmer, MD    DOA: 02/09/2020 LOS: 6   Brief Narrative   Per H&P by Dr. Lorin Mercy: "Nicole Schmidt is a 54 y.o. female with medical history significant of PE (2016); afib on Eliquis; morbid obesity (BMI 61.79); hypothyroidism; HTN; DM; OSA on CPAP (autopap); stage 3 CKD; chronic diastolic CHF; and remote breast cancer presenting with SOB.  Friday night, she had a dry cough and was around family.  She sneezed a lot and then felt congested and so stayed home after.  She could tell that she wasn't able to take a deep breath.  She has chronic DOE but usually recovers quickly.  Since Friday, she has been more SOB.  +LE edema, possibly mildly worse than usual.  She sleeps in a recliner at baseline.  She has gained weight, maybe 20 pounds this year from lack of mobility."   In the ED, O2 sat 87 on room air.  Covid and influenza negative.  BNP mildly elevated 197.9.  Leukocytosis 15.4k CT chest without contrast (due to renal function) showed Widely scattered patchy and indistinct nodular pulmonary opacity compatible with acute viral/atypical respiratory infection. No pleural effusion. Superimposed bilateral mosaic attenuation which can be related to small airway or small vessel disease (and history of 2016 PE).  Admitted for further evaluation and management of suspected CHF exacerbation, and likely atypical pneumonia.  Initially diuresed with IV Lasix, since stopped due to worsened renal function.  Started on empiric antibiotics for PNA vs bronchitis.  Echo showed EG 65-70%, severe LVH, grade II diastolic dysfunction, moderate LA dilation, mild RA dilation.    Assessment & Plan   Principal Problem:   Acute hypoxemic respiratory failure (HCC) Active Problems:   History of breast cancer   Chronic renal disease, stage III (HCC)   Essential hypertension   Chronic diastolic heart failure (HCC)   OSA on CPAP   Morbid  obesity (HCC)   Atrial fibrillation, chronic (HCC)   Dyslipidemia   Diabetes mellitus type 2 in obese ([place])   Acquired hypothyroidism   Thrush, oral   Acute respiratory failure with hypoxia due to Pneumonia  - primary symptom of dyspnea on exertion above baseline, slightly worse edema, chest congestion.  Has hx of PE on Eliquis, so less likely PE.  --documented O2 sat of 87% on presentation. --CT chest showed "Widely scattered patchy and indistinct nodular pulmonary opacity compatible with acute viral/atypical respiratory infection. No pleural effusion."  COVID neg, RVP neg. --BNP 197, which is similar to prior.  Not showing signs of fluid overload, more PNA. --started on empiric abx on admission Plan: --cont ceftriaxone --Hold diuretics due to CKD and pt not showing signs of pulm congestion --cont scheduled DuoNeb --Continue supplemental O2 to keep sats >=92%, wean as tolerated  Hypertension  - on home amlodipine, clonidine, metop and losartan --cont home regimen except for losartan  --PRN hydralazine.  Hyperlipidemia  --Lipid panel with TG's 152 otherwise normal. --cont home statin  Type 2 Diabetes  - last A1c 7.1% in 10/2018, improved with current A1c of 6.4%.   --Hold Dulaglutide.  --cont SSI  Hx of PE on Eliquis --cont home Eliquis  Hx of A-fib on Eliquis - rate controlled.   --cont home metop and eliquis  Hypothyroidism  - continue Synthroid.    Thrush - Mycostatin   Stage 3b CKD - stable on admission.  Slight bump in Cr with  IV diuretic.   --Hold diuresis for now --Hold home losartan for now  OSA  - nursing noted pt refusing nightly CPAP/autopap  Hx of Breast cancer - in remission, last mammogram on 11/21/19.  Has ongoing yearly surveillance.  Morbid obesity - Body mass index is 63.39 kg/m. Complicates overall care and prognosis.  Recommend lifestyle modifications, consider bariatric referral.  Left Adrenal nodule - 2.3 cm, seen on CT  incidentally.  Likely benign based on appearance.   Recommendations is follow up CT scan with and without contrast in 1 year.   DVT prophylaxis:  apixaban (ELIQUIS) tablet 5 mg   Diet:  Diet Orders (From admission, onward)    Start     Ordered   02/09/20 1005  Diet Carb Modified Fluid consistency: Thin; Room service appropriate? Yes  Diet effective now       Question Answer Comment  Diet-HS Snack? Nothing   Calorie Level Medium 1600-2000   Fluid consistency: Thin   Room service appropriate? Yes      02/09/20 1006            Code Status: Full Code    Subjective 02/15/20    Pt reported feeling better, but gets DOE.  Normal oral intake, having BM's.     Disposition Plan & Communication   DVT prophylaxis: [id number] Code Status: Full code  Family Communication:  Status is: inpatient Dispo:   The patient is from: home Anticipated d/c is to: home Anticipated d/c date is: 1-2 days Patient currently is not medically stable to d/c due to: new O2, on IV abx   Consults, Procedures, Significant Events   Consultants:   None  Procedures:   Echo EF 65-70%, grade II diastolic dysfunction, severe LVH  Antimicrobials:  Anti-infectives (From admission, onward)   Start     Dose/Rate Route Frequency Ordered Stop   02/11/20 2000  cefTRIAXone (ROCEPHIN) 2 g in sodium chloride 0.9 % 100 mL IVPB        2 g 200 mL/hr over 30 Minutes Intravenous Every 24 hours 02/11/20 1343     02/10/20 1900  azithromycin (ZITHROMAX) 500 mg in sodium chloride 0.9 % 250 mL IVPB        500 mg 250 mL/hr over 60 Minutes Intravenous Every 24 hours 02/10/20 1804 02/12/20 1829   02/10/20 1900  cefTRIAXone (ROCEPHIN) 1 g in sodium chloride 0.9 % 100 mL IVPB  Status:  Discontinued        1 g 200 mL/hr over 30 Minutes Intravenous Every 24 hours 02/10/20 1804 02/11/20 1343   02/09/20 0630  azithromycin (ZITHROMAX) 500 mg in sodium chloride 0.9 % 250 mL IVPB        500 mg 250 mL/hr over 60 Minutes  Intravenous  Once 02/09/20 0628 02/09/20 0950        Objective   Vitals:   02/14/20 0814 02/14/20 1128 02/14/20 2030 02/14/20 2340  BP:  (!) 158/76 (!) 188/65 (!) 158/79  Pulse:  75 68 66  Resp:   20 20  Temp:   97.7 F (36.5 C) 98.4 F (36.9 C)  TempSrc:   Oral Oral  SpO2: 93% 97% 97% 97%  Weight:      Height:        Intake/Output Summary (Last 24 hours) at 02/15/2020 0322 Last data filed at 02/14/2020 2158 Gross per 24 hour  Intake 480 ml  Output 1500 ml  Net -1020 ml   Filed Weights   02/11/20 0500 02/12/20 0108 02/14/20 0500  Weight: (!) 165.1 kg (!) 169.5 kg (!) 167.5 kg    Physical Exam:  Constitutional: NAD, AAOx3 HEENT: conjunctivae and lids normal, EOMI CV: No cyanosis.   RESP: better air movement, on 2L Extremities: chronic edema in BLE SKIN: warm, dry and intact Neuro: II - XII grossly intact.   Psych: Normal mood and affect.  Appropriate judgement and reason   Labs   Data Reviewed: I have personally reviewed following labs and imaging studies  CBC: Recent Labs  Lab 02/10/20 0320 02/11/20 0522 02/12/20 0529 02/13/20 0356 02/14/20 0507  WBC 14.2* 12.5* 11.4* 12.6* 11.6*  NEUTROABS 10.3* 8.5*  --   --   --   HGB 10.9* 10.6* 10.1* 10.3* 9.8*  HCT 36.4 35.9* 34.2* 34.4* 33.7*  MCV 92.9 95.0 94.0 93.7 93.9  PLT 226 209 206 218 811   Basic Metabolic Panel: Recent Labs  Lab 02/10/20 0320 02/11/20 0522 02/12/20 0529 02/13/20 0356 02/14/20 0507  NA 143 144 143 145 143  K 4.7 5.0 4.6 4.9 4.8  CL 104 103 105 106 105  CO2 29 31 28 31 30   GLUCOSE 176* 103* 155* 116* 111*  BUN 45* 51* 57* 56* 52*  CREATININE 2.11* 2.18* 2.05* 1.94* 1.75*  CALCIUM 8.7* 8.6* 8.3* 8.5* 8.7*  MG  --   --  2.3 2.3 2.4   GFR: Estimated Creatinine Clearance: 56.6 mL/min (A) (by C-G formula based on SCr of 1.75 mg/dL (H)). Liver Function Tests: Recent Labs  Lab 02/09/20 0200  AST 19  ALT 21  ALKPHOS 146*  BILITOT 0.5  PROT 7.8  ALBUMIN 3.7   No results  for input(s): LIPASE, AMYLASE in the last 168 hours. No results for input(s): AMMONIA in the last 168 hours. Coagulation Profile: No results for input(s): INR, PROTIME in the last 168 hours. Cardiac Enzymes: No results for input(s): CKTOTAL, CKMB, CKMBINDEX, TROPONINI in the last 168 hours. BNP (last 3 results) No results for input(s): PROBNP in the last 8760 hours. HbA1C: No results for input(s): HGBA1C in the last 72 hours. CBG: Recent Labs  Lab 02/13/20 2207 02/14/20 0811 02/14/20 1125 02/14/20 1601 02/14/20 2124  GLUCAP 104* 86 181* 109* 120*   Lipid Profile: No results for input(s): CHOL, HDL, LDLCALC, TRIG, CHOLHDL, LDLDIRECT in the last 72 hours. Thyroid Function Tests: No results for input(s): TSH, T4TOTAL, FREET4, T3FREE, THYROIDAB in the last 72 hours. Anemia Panel: No results for input(s): VITAMINB12, FOLATE, FERRITIN, TIBC, IRON, RETICCTPCT in the last 72 hours. Sepsis Labs: Recent Labs  Lab 02/10/20 0320  PROCALCITON <0.10    Recent Results (from the past 240 hour(s))  Resp Panel by RT-PCR (Flu A&B, Covid) Nasopharyngeal Swab     Status: None   Collection Time: 02/09/20  2:00 AM   Specimen: Nasopharyngeal Swab; Nasopharyngeal(NP) swabs in vial transport medium  Result Value Ref Range Status   SARS Coronavirus 2 by RT PCR NEGATIVE NEGATIVE Final    Comment: (NOTE) SARS-CoV-2 target nucleic acids are NOT DETECTED.  The SARS-CoV-2 RNA is generally detectable in upper respiratory specimens during the acute phase of infection. The lowest concentration of SARS-CoV-2 viral copies this assay can detect is 138 copies/mL. A negative result does not preclude SARS-Cov-2 infection and should not be used as the sole basis for treatment or other patient management decisions. A negative result may occur with  improper specimen collection/handling, submission of specimen other than nasopharyngeal swab, presence of viral mutation(s) within the areas targeted by this  assay, and inadequate number  of viral copies(<138 copies/mL). A negative result must be combined with clinical observations, patient history, and epidemiological information. The expected result is Negative.  Fact Sheet for Patients:  EntrepreneurPulse.com.au  Fact Sheet for Healthcare Providers:  IncredibleEmployment.be  This test is no t yet approved or cleared by the [place] FDA and  has been authorized for detection and/or diagnosis of SARS-CoV-2 by FDA under an Emergency Use Authorization (EUA). This EUA will remain  in effect (meaning this test can be used) for the duration of the COVID-19 declaration under Section 564(b)(1) of the Act, 21 U.S.C.section 360bbb-3(b)(1), unless the authorization is terminated  or revoked sooner.       Influenza A by PCR NEGATIVE NEGATIVE Final   Influenza B by PCR NEGATIVE NEGATIVE Final    Comment: (NOTE) The Xpert Xpress SARS-CoV-2/FLU/RSV plus assay is intended as an aid in the diagnosis of influenza from Nasopharyngeal swab specimens and should not be used as a sole basis for treatment. Nasal washings and aspirates are unacceptable for Xpert Xpress SARS-CoV-2/FLU/RSV testing.  Fact Sheet for Patients: EntrepreneurPulse.com.au  Fact Sheet for Healthcare Providers: IncredibleEmployment.be  This test is not yet approved or cleared by the [place] FDA and has been authorized for detection and/or diagnosis of SARS-CoV-2 by FDA under an Emergency Use Authorization (EUA). This EUA will remain in effect (meaning this test can be used) for the duration of the COVID-19 declaration under Section 564(b)(1) of the Act, 21 U.S.C. section 360bbb-3(b)(1), unless the authorization is terminated or revoked.  Performed at [facility], [address] [place], [place] [address]   Respiratory (~20 pathogens) panel by PCR     Status: None   Collection Time:  02/11/20 12:20 PM   Specimen: Nasopharyngeal Swab; Respiratory  Result Value Ref Range Status   Adenovirus NOT DETECTED NOT DETECTED Final   Coronavirus 229E NOT DETECTED NOT DETECTED Final    Comment: (NOTE) The Coronavirus on the Respiratory Panel, DOES NOT test for the novel  Coronavirus (2019 nCoV)    Coronavirus HKU1 NOT DETECTED NOT DETECTED Final   Coronavirus NL63 NOT DETECTED NOT DETECTED Final   Coronavirus OC43 NOT DETECTED NOT DETECTED Final   Metapneumovirus NOT DETECTED NOT DETECTED Final   Rhinovirus / Enterovirus NOT DETECTED NOT DETECTED Final   Influenza A NOT DETECTED NOT DETECTED Final   Influenza B NOT DETECTED NOT DETECTED Final   Parainfluenza Virus 1 NOT DETECTED NOT DETECTED Final   Parainfluenza Virus 2 NOT DETECTED NOT DETECTED Final   Parainfluenza Virus 3 NOT DETECTED NOT DETECTED Final   Parainfluenza Virus 4 NOT DETECTED NOT DETECTED Final   Respiratory Syncytial Virus NOT DETECTED NOT DETECTED Final   Bordetella pertussis NOT DETECTED NOT DETECTED Final   Bordetella Parapertussis NOT DETECTED NOT DETECTED Final   Chlamydophila pneumoniae NOT DETECTED NOT DETECTED Final   Mycoplasma pneumoniae NOT DETECTED NOT DETECTED Final    Comment: Performed at [facility], [place]. [address] [place], [place] [address]      Imaging Studies   No results found.   Medications   Scheduled Meds: . amLODipine  5 mg Oral BID  . apixaban  5 mg Oral BID  . atorvastatin  20 mg Oral QHS  . cloNIDine  0.2 mg Oral BID  . dextromethorphan-guaiFENesin  1 tablet Oral BID  . folic acid  6,948 mcg Oral Daily  . insulin aspart  0-15 Units Subcutaneous TID WC  . ipratropium-albuterol  3 mL Nebulization BID  . levothyroxine  112 mcg Oral QAC breakfast  . metoprolol tartrate  50 mg Oral BID  . nystatin  5 mL Oral QID  . pneumococcal 23 valent vaccine  0.5 mL Intramuscular Tomorrow-1000  . sodium chloride flush  3 mL Intravenous Q12H   Continuous Infusions: .  sodium chloride Stopped (02/14/20 0054)  . cefTRIAXone (ROCEPHIN)  IV 2 g (02/14/20 2135)       LOS: 6 days     Enzo Bi, md [facility]  02/15/2020, 3:22 AM

## 2020-02-13 NOTE — Progress Notes (Signed)
PT Cancellation Note  Patient Details Name: Nicole Schmidt MRN: [id number] DOB: Apr 13, 1965   Cancelled Treatment:    Reason Eval/Treat Not Completed: Other (comment) (Patient attempted to be seen, room currently being mopped. Will attempt again at later time/date as available.)  Janna Arch, PT, DPT   02/13/2020, 11:26 AM

## 2020-02-14 DIAGNOSIS — J9601 Acute respiratory failure with hypoxia: Secondary | ICD-10-CM | POA: Diagnosis not present

## 2020-02-14 LAB — GLUCOSE, CAPILLARY
Glucose-Capillary: 86 mg/dL (ref 70–99)
Glucose-Capillary: 181 mg/dL — ABNORMAL HIGH (ref 70–99)
Glucose-Capillary: 109 mg/dL — ABNORMAL HIGH (ref 70–99)
Glucose-Capillary: 120 mg/dL — ABNORMAL HIGH (ref 70–99)

## 2020-02-14 LAB — MAGNESIUM: Magnesium: 2.4 mg/dL (ref 1.7–2.4)

## 2020-02-14 LAB — CBC
HCT: 33.7 % — ABNORMAL LOW (ref 36.0–46.0)
Hemoglobin: 9.8 g/dL — ABNORMAL LOW (ref 12.0–15.0)
MCH: 27.3 pg (ref 26.0–34.0)
MCHC: 29.1 g/dL — ABNORMAL LOW (ref 30.0–36.0)
MCV: 93.9 fL (ref 80.0–100.0)
Platelets: 220 10*3/uL (ref 150–400)
RBC: 3.59 MIL/uL — ABNORMAL LOW (ref 3.87–5.11)
RDW: 14.1 % (ref 11.5–15.5)
WBC: 11.6 10*3/uL — ABNORMAL HIGH (ref 4.0–10.5)
nRBC: 0 % (ref 0.0–0.2)

## 2020-02-14 LAB — BASIC METABOLIC PANEL
Anion gap: 8 (ref 5–15)
BUN: 52 mg/dL — ABNORMAL HIGH (ref 6–20)
CO2: 30 mmol/L (ref 22–32)
Calcium: 8.7 mg/dL — ABNORMAL LOW (ref 8.9–10.3)
Chloride: 105 mmol/L (ref 98–111)
Creatinine, Ser: 1.75 mg/dL — ABNORMAL HIGH (ref 0.44–1.00)
GFR, Estimated: 34 mL/min — ABNORMAL LOW (ref >60)
Glucose, Bld: 111 mg/dL — ABNORMAL HIGH (ref 70–99)
Potassium: 4.8 mmol/L (ref 3.5–5.1)
Sodium: 143 mmol/L (ref 135–145)

## 2020-02-14 NOTE — Progress Notes (Signed)
PROGRESS NOTE    Nicole Schmidt   ERX:[id number]  DOB: 1965-05-27  PCP: Lenard Simmer, MD    DOA: 02/09/2020 LOS: 6   Brief Narrative   Per H&P by Dr. Lorin Mercy: "Nicole Schmidt is a 55 y.o. female with medical history significant of PE (2016); afib on Eliquis; morbid obesity (BMI 61.79); hypothyroidism; HTN; DM; OSA on CPAP (autopap); stage 3 CKD; chronic diastolic CHF; and remote breast cancer presenting with SOB.  Friday night, she had a dry cough and was around family.  She sneezed a lot and then felt congested and so stayed home after.  She could tell that she wasn't able to take a deep breath.  She has chronic DOE but usually recovers quickly.  Since Friday, she has been more SOB.  +LE edema, possibly mildly worse than usual.  She sleeps in a recliner at baseline.  She has gained weight, maybe 20 pounds this year from lack of mobility."   In the ED, O2 sat 87 on room air.  Covid and influenza negative.  BNP mildly elevated 197.9.  Leukocytosis 15.4k CT chest without contrast (due to renal function) showed Widely scattered patchy and indistinct nodular pulmonary opacity compatible with acute viral/atypical respiratory infection. No pleural effusion. Superimposed bilateral mosaic attenuation which can be related to small airway or small vessel disease (and history of 2016 PE).  Admitted for further evaluation and management of suspected CHF exacerbation, and likely atypical pneumonia.  Initially diuresed with IV Lasix, since stopped due to worsened renal function.  Started on empiric antibiotics for PNA vs bronchitis.  Echo showed EG 65-70%, severe LVH, grade II diastolic dysfunction, moderate LA dilation, mild RA dilation.    Assessment & Plan   Principal Problem:   Acute hypoxemic respiratory failure (HCC) Active Problems:   History of breast cancer   Chronic renal disease, stage III (HCC)   Essential hypertension   Chronic diastolic heart failure (HCC)   OSA on CPAP   Morbid  obesity (HCC)   Atrial fibrillation, chronic (HCC)   Dyslipidemia   Diabetes mellitus type 2 in obese ([place])   Acquired hypothyroidism   Thrush, oral   Acute respiratory failure with hypoxia due to Pneumonia  - primary symptom of dyspnea on exertion above baseline, slightly worse edema, chest congestion.  Has hx of PE on Eliquis, so less likely PE.  --documented O2 sat of 87% on presentation. --CT chest showed "Widely scattered patchy and indistinct nodular pulmonary opacity compatible with acute viral/atypical respiratory infection. No pleural effusion."  COVID neg, RVP neg. --BNP 197, which is similar to prior.  Not showing signs of fluid overload, more PNA. --started on empiric abx on admission, completed 5 days of ceftriaxone. --pt desat to 77% while on room air with ambulation. Plan: --Hold diuretics due to CKD and pt not showing signs of pulm congestion --cont scheduled DuoNeb --Continue supplemental O2 to keep sats >=92%, wean as tolerated --IS  Hypertension  - on home amlodipine, clonidine, metop and losartan --cont home regimen except for losartan  --PRN hydralazine.  Hyperlipidemia  --Lipid panel with TG's 152 otherwise normal. --cont home statin  Type 2 Diabetes  - last A1c 7.1% in 10/2018, improved with current A1c of 6.4%.   --Hold Dulaglutide.  --cont SSI  Hx of PE on Eliquis --cont home Eliquis  Hx of A-fib on Eliquis - rate controlled.   --cont home metop and eliquis  Hypothyroidism  - continue Synthroid.    Thrush - Mycostatin  Stage 3b CKD - stable on admission.  Slight bump in Cr with IV diuretic.   --Hold diuresis for now --Hold home losartan for now  OSA  - nursing noted pt refusing nightly CPAP/autopap  Hx of Breast cancer - in remission, last mammogram on 11/21/19.  Has ongoing yearly surveillance.  Morbid obesity - Body mass index is 63.39 kg/m. Complicates overall care and prognosis.  Recommend lifestyle modifications, consider  bariatric referral.  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  Likely benign based on appearance.   Recommendations is follow up CT scan with and without contrast in 1 year.   DVT prophylaxis:  apixaban (ELIQUIS) tablet 5 mg   Diet:  Diet Orders (From admission, onward)    Start     Ordered   02/09/20 1005  Diet Carb Modified Fluid consistency: Thin; Room service appropriate? Yes  Diet effective now       Question Answer Comment  Diet-HS Snack? Nothing   Calorie Level Medium 1600-2000   Fluid consistency: Thin   Room service appropriate? Yes      02/09/20 1006            Code Status: Full Code    Subjective 02/15/20    Pt reported still having some coughing, but dyspnea improved.  Unexpectedly, pt desat to 77% while on room air with ambulation.     Disposition Plan & Communication   DVT prophylaxis: UX:NATFTDD Code Status: Full code  Family Communication:  Status is: inpatient Dispo:   The patient is from: home Anticipated d/c is to: home Anticipated d/c date is: 1-2 days Patient currently is not medically stable to d/c due to: still on 2L O2.   Consults, Procedures, Significant Events   Consultants:   None  Procedures:   Echo EF 65-70%, grade II diastolic dysfunction, severe LVH  Antimicrobials:  Anti-infectives (From admission, onward)   Start     Dose/Rate Route Frequency Ordered Stop   02/11/20 2000  cefTRIAXone (ROCEPHIN) 2 g in sodium chloride 0.9 % 100 mL IVPB        2 g 200 mL/hr over 30 Minutes Intravenous Every 24 hours 02/11/20 1343     02/10/20 1900  azithromycin (ZITHROMAX) 500 mg in sodium chloride 0.9 % 250 mL IVPB        500 mg 250 mL/hr over 60 Minutes Intravenous Every 24 hours 02/10/20 1804 02/12/20 1829   02/10/20 1900  cefTRIAXone (ROCEPHIN) 1 g in sodium chloride 0.9 % 100 mL IVPB  Status:  Discontinued        1 g 200 mL/hr over 30 Minutes Intravenous Every 24 hours 02/10/20 1804 02/11/20 1343   02/09/20 0630   azithromycin (ZITHROMAX) 500 mg in sodium chloride 0.9 % 250 mL IVPB        500 mg 250 mL/hr over 60 Minutes Intravenous  Once 02/09/20 0628 02/09/20 0950        Objective   Vitals:   02/14/20 2030 02/14/20 2340 02/15/20 0508 02/15/20 0705  BP: (!) 188/65 (!) 158/79 (!) 160/65   Pulse: 68 66 65   Resp: 20 20 20    Temp: 97.7 F (36.5 C) 98.4 F (36.9 C) 97.8 F (36.6 C)   TempSrc: Oral Oral Oral   SpO2: 97% 97% 96% 92%  Weight:      Height:        Intake/Output Summary (Last 24 hours) at 02/15/2020 2202 Last data filed at 02/15/2020 0500 Gross per 24 hour  Intake 480 ml  Output 2100 ml  Net -1620 ml   Filed Weights   02/11/20 0500 02/12/20 0108 02/14/20 0500  Weight: (!) 165.1 kg (!) 169.5 kg (!) 167.5 kg    Physical Exam:  Constitutional: NAD, AAOx3 HEENT: conjunctivae and lids normal, EOMI CV: No cyanosis.   RESP: reduced air movement, but improved from prior, on 2L Extremities: chronic edema in BLE SKIN: warm, dry and intact Neuro: II - XII grossly intact.   Psych: Normal mood and affect.  Appropriate judgement and reason    Labs   Data Reviewed: I have personally reviewed following labs and imaging studies  CBC: Recent Labs  Lab 02/10/20 0320 02/11/20 0522 02/12/20 0529 02/13/20 0356 02/14/20 0507 02/15/20 0533  WBC 14.2* 12.5* 11.4* 12.6* 11.6* 11.7*  NEUTROABS 10.3* 8.5*  --   --   --   --   HGB 10.9* 10.6* 10.1* 10.3* 9.8* 10.0*  HCT 36.4 35.9* 34.2* 34.4* 33.7* 34.2*  MCV 92.9 95.0 94.0 93.7 93.9 94.2  PLT 226 209 206 218 220 846   Basic Metabolic Panel: Recent Labs  Lab 02/11/20 0522 02/12/20 0529 02/13/20 0356 02/14/20 0507 02/15/20 0533  NA 144 143 145 143 144  K 5.0 4.6 4.9 4.8 5.0  CL 103 105 106 105 106  CO2 31 28 31 30 29   GLUCOSE 103* 155* 116* 111* 123*  BUN 51* 57* 56* 52* 48*  CREATININE 2.18* 2.05* 1.94* 1.75* 1.66*  CALCIUM 8.6* 8.3* 8.5* 8.7* 8.9  MG  --  2.3 2.3 2.4 2.3   GFR: Estimated Creatinine Clearance: 59.6  mL/min (A) (by C-G formula based on SCr of 1.66 mg/dL (H)). Liver Function Tests: Recent Labs  Lab 02/09/20 0200  AST 19  ALT 21  ALKPHOS 146*  BILITOT 0.5  PROT 7.8  ALBUMIN 3.7   No results for input(s): LIPASE, AMYLASE in the last 168 hours. No results for input(s): AMMONIA in the last 168 hours. Coagulation Profile: No results for input(s): INR, PROTIME in the last 168 hours. Cardiac Enzymes: No results for input(s): CKTOTAL, CKMB, CKMBINDEX, TROPONINI in the last 168 hours. BNP (last 3 results) No results for input(s): PROBNP in the last 8760 hours. HbA1C: No results for input(s): HGBA1C in the last 72 hours. CBG: Recent Labs  Lab 02/13/20 2207 02/14/20 0811 02/14/20 1125 02/14/20 1601 02/14/20 2124  GLUCAP 104* 86 181* 109* 120*   Lipid Profile: No results for input(s): CHOL, HDL, LDLCALC, TRIG, CHOLHDL, LDLDIRECT in the last 72 hours. Thyroid Function Tests: No results for input(s): TSH, T4TOTAL, FREET4, T3FREE, THYROIDAB in the last 72 hours. Anemia Panel: No results for input(s): VITAMINB12, FOLATE, FERRITIN, TIBC, IRON, RETICCTPCT in the last 72 hours. Sepsis Labs: Recent Labs  Lab 02/10/20 0320  PROCALCITON <0.10    Recent Results (from the past 240 hour(s))  Resp Panel by RT-PCR (Flu A&B, Covid) Nasopharyngeal Swab     Status: None   Collection Time: 02/09/20  2:00 AM   Specimen: Nasopharyngeal Swab; Nasopharyngeal(NP) swabs in vial transport medium  Result Value Ref Range Status   SARS Coronavirus 2 by RT PCR NEGATIVE NEGATIVE Final    Comment: (NOTE) SARS-CoV-2 target nucleic acids are NOT DETECTED.  The SARS-CoV-2 RNA is generally detectable in upper respiratory specimens during the acute phase of infection. The lowest concentration of SARS-CoV-2 viral copies this assay can detect is 138 copies/mL. A negative result does not preclude SARS-Cov-2 infection and should not be used as the sole basis for treatment  or other patient management  decisions. A negative result may occur with  improper specimen collection/handling, submission of specimen other than nasopharyngeal swab, presence of viral mutation(s) within the areas targeted by this assay, and inadequate number of viral copies(<138 copies/mL). A negative result must be combined with clinical observations, patient history, and epidemiological information. The expected result is Negative.  Fact Sheet for Patients:  EntrepreneurPulse.com.au  Fact Sheet for Healthcare Providers:  IncredibleEmployment.be  This test is no t yet approved or cleared by the [place] FDA and  has been authorized for detection and/or diagnosis of SARS-CoV-2 by FDA under an Emergency Use Authorization (EUA). This EUA will remain  in effect (meaning this test can be used) for the duration of the COVID-19 declaration under Section 564(b)(1) of the Act, 21 U.S.C.section 360bbb-3(b)(1), unless the authorization is terminated  or revoked sooner.       Influenza A by PCR NEGATIVE NEGATIVE Final   Influenza B by PCR NEGATIVE NEGATIVE Final    Comment: (NOTE) The Xpert Xpress SARS-CoV-2/FLU/RSV plus assay is intended as an aid in the diagnosis of influenza from Nasopharyngeal swab specimens and should not be used as a sole basis for treatment. Nasal washings and aspirates are unacceptable for Xpert Xpress SARS-CoV-2/FLU/RSV testing.  Fact Sheet for Patients: EntrepreneurPulse.com.au  Fact Sheet for Healthcare Providers: IncredibleEmployment.be  This test is not yet approved or cleared by the [place] FDA and has been authorized for detection and/or diagnosis of SARS-CoV-2 by FDA under an Emergency Use Authorization (EUA). This EUA will remain in effect (meaning this test can be used) for the duration of the COVID-19 declaration under Section 564(b)(1) of the Act, 21 U.S.C. section 360bbb-3(b)(1), unless the  authorization is terminated or revoked.  Performed at [facility], [address] [place], [place] [address]   Respiratory (~20 pathogens) panel by PCR     Status: None   Collection Time: 02/11/20 12:20 PM   Specimen: Nasopharyngeal Swab; Respiratory  Result Value Ref Range Status   Adenovirus NOT DETECTED NOT DETECTED Final   Coronavirus 229E NOT DETECTED NOT DETECTED Final    Comment: (NOTE) The Coronavirus on the Respiratory Panel, DOES NOT test for the novel  Coronavirus (2019 nCoV)    Coronavirus HKU1 NOT DETECTED NOT DETECTED Final   Coronavirus NL63 NOT DETECTED NOT DETECTED Final   Coronavirus OC43 NOT DETECTED NOT DETECTED Final   Metapneumovirus NOT DETECTED NOT DETECTED Final   Rhinovirus / Enterovirus NOT DETECTED NOT DETECTED Final   Influenza A NOT DETECTED NOT DETECTED Final   Influenza B NOT DETECTED NOT DETECTED Final   Parainfluenza Virus 1 NOT DETECTED NOT DETECTED Final   Parainfluenza Virus 2 NOT DETECTED NOT DETECTED Final   Parainfluenza Virus 3 NOT DETECTED NOT DETECTED Final   Parainfluenza Virus 4 NOT DETECTED NOT DETECTED Final   Respiratory Syncytial Virus NOT DETECTED NOT DETECTED Final   Bordetella pertussis NOT DETECTED NOT DETECTED Final   Bordetella Parapertussis NOT DETECTED NOT DETECTED Final   Chlamydophila pneumoniae NOT DETECTED NOT DETECTED Final   Mycoplasma pneumoniae NOT DETECTED NOT DETECTED Final    Comment: Performed at [facility], [place]. [address], [place], [place] [address]      Imaging Studies   No results found.   Medications   Scheduled Meds: . amLODipine  5 mg Oral BID  . apixaban  5 mg Oral BID  . atorvastatin  20 mg Oral QHS  . cloNIDine  0.2 mg Oral BID  .  dextromethorphan-guaiFENesin  1 tablet Oral BID  . folic acid  7,322 mcg Oral Daily  . insulin aspart  0-15 Units Subcutaneous TID WC  . ipratropium-albuterol  3 mL Nebulization BID  . levothyroxine  112 mcg Oral QAC breakfast  . metoprolol  tartrate  50 mg Oral BID  . nystatin  5 mL Oral QID  . pneumococcal 23 valent vaccine  0.5 mL Intramuscular Tomorrow-1000  . sodium chloride flush  3 mL Intravenous Q12H   Continuous Infusions: . sodium chloride Stopped (02/14/20 0054)  . cefTRIAXone (ROCEPHIN)  IV 2 g (02/14/20 2135)       LOS: 6 days     Enzo Bi, md [facility]  02/15/2020, 7:09 AM

## 2020-02-14 NOTE — Progress Notes (Signed)
SATURATION QUALIFICATIONS: (This note is used to comply with regulatory documentation for home oxygen)  Patient Saturations on Room Air at Rest = 91%  Patient Saturations on Room Air while Ambulating = 77%  Patient Saturations on 2 Liters of oxygen while Ambulating = 90%  Please briefly explain why patient needs home oxygen:

## 2020-02-15 ENCOUNTER — Inpatient Hospital Stay: Payer: BC Managed Care – PPO

## 2020-02-15 DIAGNOSIS — I5032 Chronic diastolic (congestive) heart failure: Secondary | ICD-10-CM

## 2020-02-15 DIAGNOSIS — J9601 Acute respiratory failure with hypoxia: Secondary | ICD-10-CM | POA: Diagnosis not present

## 2020-02-15 DIAGNOSIS — G4733 Obstructive sleep apnea (adult) (pediatric): Secondary | ICD-10-CM | POA: Diagnosis not present

## 2020-02-15 DIAGNOSIS — I10 Essential (primary) hypertension: Secondary | ICD-10-CM | POA: Diagnosis not present

## 2020-02-15 LAB — CBC
HCT: 34.2 % — ABNORMAL LOW (ref 36.0–46.0)
Hemoglobin: 10 g/dL — ABNORMAL LOW (ref 12.0–15.0)
MCH: 27.5 pg (ref 26.0–34.0)
MCHC: 29.2 g/dL — ABNORMAL LOW (ref 30.0–36.0)
MCV: 94.2 fL (ref 80.0–100.0)
Platelets: 207 10*3/uL (ref 150–400)
RBC: 3.63 MIL/uL — ABNORMAL LOW (ref 3.87–5.11)
RDW: 14 % (ref 11.5–15.5)
WBC: 11.7 10*3/uL — ABNORMAL HIGH (ref 4.0–10.5)
nRBC: 0 % (ref 0.0–0.2)

## 2020-02-15 LAB — BASIC METABOLIC PANEL
Anion gap: 9 (ref 5–15)
BUN: 48 mg/dL — ABNORMAL HIGH (ref 6–20)
CO2: 29 mmol/L (ref 22–32)
Calcium: 8.9 mg/dL (ref 8.9–10.3)
Chloride: 106 mmol/L (ref 98–111)
Creatinine, Ser: 1.66 mg/dL — ABNORMAL HIGH (ref 0.44–1.00)
GFR, Estimated: 36 mL/min — ABNORMAL LOW (ref >60)
Glucose, Bld: 123 mg/dL — ABNORMAL HIGH (ref 70–99)
Potassium: 5 mmol/L (ref 3.5–5.1)
Sodium: 144 mmol/L (ref 135–145)

## 2020-02-15 LAB — GLUCOSE, CAPILLARY
Glucose-Capillary: 91 mg/dL (ref 70–99)
Glucose-Capillary: 137 mg/dL — ABNORMAL HIGH (ref 70–99)
Glucose-Capillary: 151 mg/dL — ABNORMAL HIGH (ref 70–99)
Glucose-Capillary: 103 mg/dL — ABNORMAL HIGH (ref 70–99)

## 2020-02-15 LAB — MAGNESIUM: Magnesium: 2.3 mg/dL (ref 1.7–2.4)

## 2020-02-15 MED ORDER — FUROSEMIDE 10 MG/ML IJ SOLN
40.0000 mg | Freq: Once | INTRAMUSCULAR | Status: AC
  Administered 2020-02-15: 40 mg via INTRAVENOUS
  Filled 2020-02-15: qty 4

## 2020-02-15 MED ORDER — SPIRONOLACTONE 25 MG PO TABS
25.0000 mg | ORAL_TABLET | Freq: Every day | ORAL | Status: DC
  Administered 2020-02-15 – 2020-02-16 (×2): 25 mg via ORAL
  Filled 2020-02-15 (×2): qty 1

## 2020-02-15 NOTE — Progress Notes (Signed)
SATURATION QUALIFICATIONS: (This note is used to comply with regulatory documentation for home oxygen)  Patient Saturations on Room Air at Rest = 94%  Patient Saturations on Room Air while Ambulating = 81%  Patient Saturations on 3 Liters of oxygen while Ambulating = 92%  Please briefly explain why patient needs home oxygen: Patient desat to 81% on room air with ambulation, required 2L O2 to get to 88%, and 3L O2 to get to 92% with ambulation.  Dola Argyle, RN

## 2020-02-15 NOTE — Progress Notes (Signed)
PROGRESS NOTE    Nicole Schmidt   OMV:[id number]  DOB: Feb 28, 1965  PCP: Lenard Simmer, MD    DOA: 02/09/2020 LOS: 6   Brief Narrative   Per H&P by Dr. Lorin Mercy: "LASHANN HAGG is a 55 y.o. female with medical history significant of PE (2016); afib on Eliquis; morbid obesity (BMI 61.79); hypothyroidism; HTN; DM; OSA on CPAP (autopap); stage 3 CKD; chronic diastolic CHF; and remote breast cancer presenting with SOB.  Friday night, she had a dry cough and was around family.  She sneezed a lot and then felt congested and so stayed home after.  She could tell that she wasn't able to take a deep breath.  She has chronic DOE but usually recovers quickly.  Since Friday, she has been more SOB.  +LE edema, possibly mildly worse than usual.  She sleeps in a recliner at baseline.  She has gained weight, maybe 20 pounds this year from lack of mobility."   In the ED, O2 sat 87 on room air.  Covid and influenza negative.  BNP mildly elevated 197.9.  Leukocytosis 15.4k CT chest without contrast (due to renal function) showed Widely scattered patchy and indistinct nodular pulmonary opacity compatible with acute viral/atypical respiratory infection. No pleural effusion. Superimposed bilateral mosaic attenuation which can be related to small airway or small vessel disease (and history of 2016 PE).  Admitted for further evaluation and management of suspected CHF exacerbation, and likely atypical pneumonia.  Initially diuresed with IV Lasix, since stopped due to worsened renal function.  Started on empiric antibiotics for PNA vs bronchitis.  Echo showed EG 65-70%, severe LVH, grade II diastolic dysfunction, moderate LA dilation, mild RA dilation.    Assessment & Plan   Principal Problem:   Acute hypoxemic respiratory failure (HCC) Active Problems:   History of breast cancer   Chronic renal disease, stage III (HCC)   Essential hypertension   Chronic diastolic heart failure (HCC)   OSA on CPAP   Morbid  obesity (HCC)   Atrial fibrillation, chronic (HCC)   Dyslipidemia   Diabetes mellitus type 2 in obese ([place])   Acquired hypothyroidism   Thrush, oral   Acute respiratory failure with hypoxia due to Pneumonia  - primary symptom of dyspnea on exertion above baseline, slightly worse edema, chest congestion.  Has hx of PE on Eliquis, so less likely PE.  --documented O2 sat of 87% on presentation. --CT chest showed "Widely scattered patchy and indistinct nodular pulmonary opacity compatible with acute viral/atypical respiratory infection. No pleural effusion."  COVID neg, RVP neg. --BNP 197, which is similar to prior.  Not showing signs of fluid overload, more PNA. --started on empiric abx on admission, completed 5 days of ceftriaxone. --pt desat to 77% while on room air with ambulation. Plan: --Consult pulmonary --Hold diuretics due to CKD and pt not showing signs of pulm congestion --cont scheduled DuoNeb --Continue supplemental O2 to keep sats >=92%, wean as tolerated --IS  Hypertension  - on home amlodipine, clonidine, metop and losartan --cont home regimen except for losartan  --PRN hydralazine.  Hyperlipidemia  --Lipid panel with TG's 152 otherwise normal. --cont home statin  Type 2 Diabetes  - last A1c 7.1% in 10/2018, improved with current A1c of 6.4%.   --Hold Dulaglutide.  --cont SSI  Hx of PE on Eliquis --cont home Eliquis  Hx of A-fib on Eliquis - rate controlled.   --cont home metop and eliquis  Hypothyroidism  - continue Synthroid.    Thrush -  Mycostatin   Stage 3b CKD - stable on admission.  Slight bump in Cr with IV diuretic.   --Hold diuresis for now --Hold home losartan for now  OSA  - nursing noted pt refusing nightly CPAP/autopap  Hx of Breast cancer - in remission, last mammogram on 11/21/19.  Has ongoing yearly surveillance.  Morbid obesity - Body mass index is 63.39 kg/m. Complicates overall care and prognosis.  Recommend lifestyle  modifications, consider bariatric referral.  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  Likely benign based on appearance.   Recommendations is follow up CT scan with and without contrast in 1 year.   DVT prophylaxis:  apixaban (ELIQUIS) tablet 5 mg   Diet:  Diet Orders (From admission, onward)    Start     Ordered   02/09/20 1005  Diet Carb Modified Fluid consistency: Thin; Room service appropriate? Yes  Diet effective now       Question Answer Comment  Diet-HS Snack? Nothing   Calorie Level Medium 1600-2000   Fluid consistency: Thin   Room service appropriate? Yes      02/09/20 1006            Code Status: Full Code    Subjective 02/15/20    Patient reports ongoing congestion and some cough but overall feeling better.  Still with dyspnea on exertion in surprised frustrated about oxygen desaturation with ambulation yesterday.  No fevers chills or wheezing.   Disposition Plan & Communication   DVT prophylaxis: OF:BPZWCHE Code Status: Full code  Family Communication:  Status is: inpatient Dispo:   The patient is from: home Anticipated d/c is to: home Anticipated d/c date is: 1-2 days Patient currently is not medically stable to d/c due to: still on 2L O2.   Consults, Procedures, Significant Events   Consultants:   None  Procedures:   Echo EF 65-70%, grade II diastolic dysfunction, severe LVH  Antimicrobials:  Anti-infectives (From admission, onward)   Start     Dose/Rate Route Frequency Ordered Stop   02/11/20 2000  cefTRIAXone (ROCEPHIN) 2 g in sodium chloride 0.9 % 100 mL IVPB  Status:  Discontinued        2 g 200 mL/hr over 30 Minutes Intravenous Every 24 hours 02/11/20 1343 02/15/20 0712   02/10/20 1900  azithromycin (ZITHROMAX) 500 mg in sodium chloride 0.9 % 250 mL IVPB        500 mg 250 mL/hr over 60 Minutes Intravenous Every 24 hours 02/10/20 1804 02/12/20 1829   02/10/20 1900  cefTRIAXone (ROCEPHIN) 1 g in sodium chloride 0.9 % 100  mL IVPB  Status:  Discontinued        1 g 200 mL/hr over 30 Minutes Intravenous Every 24 hours 02/10/20 1804 02/11/20 1343   02/09/20 0630  azithromycin (ZITHROMAX) 500 mg in sodium chloride 0.9 % 250 mL IVPB        500 mg 250 mL/hr over 60 Minutes Intravenous  Once 02/09/20 0628 02/09/20 0950        Objective   Vitals:   02/14/20 2340 02/15/20 0508 02/15/20 0705 02/15/20 0800  BP: (!) 158/79 (!) 160/65  (!) 155/63  Pulse: 66 65  80  Resp: 20 20  18   Temp: 98.4 F (36.9 C) 97.8 F (36.6 C)    TempSrc: Oral Oral    SpO2: 97% 96% 92% 95%  Weight:      Height:        Intake/Output Summary (Last 24 hours) at  02/15/2020 1517 Last data filed at 02/15/2020 0900 Gross per 24 hour  Intake 480 ml  Output 2600 ml  Net -2120 ml   Filed Weights   02/11/20 0500 02/12/20 0108 02/14/20 0500  Weight: (!) 165.1 kg (!) 169.5 kg (!) 167.5 kg    Physical Exam:  Constitutional: Awake and alert, no acute distress, obese HEENT: EOMI, moist mucous membranes, hearing grossly normal CV: RRR, normal S1-S2, no murmurs heard RESP: Decreased breath sounds but overall clear to auscultation bilaterally, on 2L/min nasal cannula oxygen Extremities: 2+ lower extremity pitting edema bilaterally, moves all extremities Neuro: Normal speech, grossly nonfocal exam  Psych: Normal mood and affect.  Appropriate judgement and reason    Labs   Data Reviewed: I have personally reviewed following labs and imaging studies  CBC: Recent Labs  Lab 02/10/20 0320 02/11/20 0522 02/12/20 0529 02/13/20 0356 02/14/20 0507 02/15/20 0533  WBC 14.2* 12.5* 11.4* 12.6* 11.6* 11.7*  NEUTROABS 10.3* 8.5*  --   --   --   --   HGB 10.9* 10.6* 10.1* 10.3* 9.8* 10.0*  HCT 36.4 35.9* 34.2* 34.4* 33.7* 34.2*  MCV 92.9 95.0 94.0 93.7 93.9 94.2  PLT 226 209 206 218 220 323   Basic Metabolic Panel: Recent Labs  Lab 02/11/20 0522 02/12/20 0529 02/13/20 0356 02/14/20 0507 02/15/20 0533  NA 144 143 145 143 144  K 5.0  4.6 4.9 4.8 5.0  CL 103 105 106 105 106  CO2 31 28 31 30 29   GLUCOSE 103* 155* 116* 111* 123*  BUN 51* 57* 56* 52* 48*  CREATININE 2.18* 2.05* 1.94* 1.75* 1.66*  CALCIUM 8.6* 8.3* 8.5* 8.7* 8.9  MG  --  2.3 2.3 2.4 2.3   GFR: Estimated Creatinine Clearance: 59.6 mL/min (A) (by C-G formula based on SCr of 1.66 mg/dL (H)). Liver Function Tests: Recent Labs  Lab 02/09/20 0200  AST 19  ALT 21  ALKPHOS 146*  BILITOT 0.5  PROT 7.8  ALBUMIN 3.7   No results for input(s): LIPASE, AMYLASE in the last 168 hours. No results for input(s): AMMONIA in the last 168 hours. Coagulation Profile: No results for input(s): INR, PROTIME in the last 168 hours. Cardiac Enzymes: No results for input(s): CKTOTAL, CKMB, CKMBINDEX, TROPONINI in the last 168 hours. BNP (last 3 results) No results for input(s): PROBNP in the last 8760 hours. HbA1C: No results for input(s): HGBA1C in the last 72 hours. CBG: Recent Labs  Lab 02/14/20 1125 02/14/20 1601 02/14/20 2124 02/15/20 0759 02/15/20 1224  GLUCAP 181* 109* 120* 91 137*   Lipid Profile: No results for input(s): CHOL, HDL, LDLCALC, TRIG, CHOLHDL, LDLDIRECT in the last 72 hours. Thyroid Function Tests: No results for input(s): TSH, T4TOTAL, FREET4, T3FREE, THYROIDAB in the last 72 hours. Anemia Panel: No results for input(s): VITAMINB12, FOLATE, FERRITIN, TIBC, IRON, RETICCTPCT in the last 72 hours. Sepsis Labs: Recent Labs  Lab 02/10/20 0320  PROCALCITON <0.10    Recent Results (from the past 240 hour(s))  Resp Panel by RT-PCR (Flu A&B, Covid) Nasopharyngeal Swab     Status: None   Collection Time: 02/09/20  2:00 AM   Specimen: Nasopharyngeal Swab; Nasopharyngeal(NP) swabs in vial transport medium  Result Value Ref Range Status   SARS Coronavirus 2 by RT PCR NEGATIVE NEGATIVE Final    Comment: (NOTE) SARS-CoV-2 target nucleic acids are NOT DETECTED.  The SARS-CoV-2 RNA is generally detectable in upper respiratory specimens during  the acute phase of infection. The lowest concentration of SARS-CoV-2 viral  copies this assay can detect is 138 copies/mL. A negative result does not preclude SARS-Cov-2 infection and should not be used as the sole basis for treatment or other patient management decisions. A negative result may occur with  improper specimen collection/handling, submission of specimen other than nasopharyngeal swab, presence of viral mutation(s) within the areas targeted by this assay, and inadequate number of viral copies(<138 copies/mL). A negative result must be combined with clinical observations, patient history, and epidemiological information. The expected result is Negative.  Fact Sheet for Patients:  EntrepreneurPulse.com.au  Fact Sheet for Healthcare Providers:  IncredibleEmployment.be  This test is no t yet approved or cleared by the [place] FDA and  has been authorized for detection and/or diagnosis of SARS-CoV-2 by FDA under an Emergency Use Authorization (EUA). This EUA will remain  in effect (meaning this test can be used) for the duration of the COVID-19 declaration under Section 564(b)(1) of the Act, 21 U.S.C.section 360bbb-3(b)(1), unless the authorization is terminated  or revoked sooner.       Influenza A by PCR NEGATIVE NEGATIVE Final   Influenza B by PCR NEGATIVE NEGATIVE Final    Comment: (NOTE) The Xpert Xpress SARS-CoV-2/FLU/RSV plus assay is intended as an aid in the diagnosis of influenza from Nasopharyngeal swab specimens and should not be used as a sole basis for treatment. Nasal washings and aspirates are unacceptable for Xpert Xpress SARS-CoV-2/FLU/RSV testing.  Fact Sheet for Patients: EntrepreneurPulse.com.au  Fact Sheet for Healthcare Providers: IncredibleEmployment.be  This test is not yet approved or cleared by the [place] FDA and has been authorized for detection and/or  diagnosis of SARS-CoV-2 by FDA under an Emergency Use Authorization (EUA). This EUA will remain in effect (meaning this test can be used) for the duration of the COVID-19 declaration under Section 564(b)(1) of the Act, 21 U.S.C. section 360bbb-3(b)(1), unless the authorization is terminated or revoked.  Performed at [facility], [address] [place], [place] [address]   Respiratory (~20 pathogens) panel by PCR     Status: None   Collection Time: 02/11/20 12:20 PM   Specimen: Nasopharyngeal Swab; Respiratory  Result Value Ref Range Status   Adenovirus NOT DETECTED NOT DETECTED Final   Coronavirus 229E NOT DETECTED NOT DETECTED Final    Comment: (NOTE) The Coronavirus on the Respiratory Panel, DOES NOT test for the novel  Coronavirus (2019 nCoV)    Coronavirus HKU1 NOT DETECTED NOT DETECTED Final   Coronavirus NL63 NOT DETECTED NOT DETECTED Final   Coronavirus OC43 NOT DETECTED NOT DETECTED Final   Metapneumovirus NOT DETECTED NOT DETECTED Final   Rhinovirus / Enterovirus NOT DETECTED NOT DETECTED Final   Influenza A NOT DETECTED NOT DETECTED Final   Influenza B NOT DETECTED NOT DETECTED Final   Parainfluenza Virus 1 NOT DETECTED NOT DETECTED Final   Parainfluenza Virus 2 NOT DETECTED NOT DETECTED Final   Parainfluenza Virus 3 NOT DETECTED NOT DETECTED Final   Parainfluenza Virus 4 NOT DETECTED NOT DETECTED Final   Respiratory Syncytial Virus NOT DETECTED NOT DETECTED Final   Bordetella pertussis NOT DETECTED NOT DETECTED Final   Bordetella Parapertussis NOT DETECTED NOT DETECTED Final   Chlamydophila pneumoniae NOT DETECTED NOT DETECTED Final   Mycoplasma pneumoniae NOT DETECTED NOT DETECTED Final    Comment: Performed at [facility], [place]. [address], [place], [place] [address]      Imaging Studies   DG Chest Port 1 View  Result Date: 02/15/2020 CLINICAL DATA:  Respiratory failure EXAM: PORTABLE  CHEST 1 VIEW COMPARISON:  02/09/2020 FINDINGS: Cardiomegaly.  Interstitial prominence and patchy bilateral airspace opacities, slightly worsened since prior study. No effusions or pneumothorax. IMPRESSION: Worsening interstitial prominence and patchy bilateral airspace disease. Electronically Signed   By: Rolm Baptise M.D.   On: 02/15/2020 14:22     Medications   Scheduled Meds: . apixaban  5 mg Oral BID  . atorvastatin  20 mg Oral QHS  . cloNIDine  0.2 mg Oral BID  . dextromethorphan-guaiFENesin  1 tablet Oral BID  . folic acid  9,753 mcg Oral Daily  . furosemide  40 mg Intravenous Once  . insulin aspart  0-15 Units Subcutaneous TID WC  . ipratropium-albuterol  3 mL Nebulization BID  . levothyroxine  112 mcg Oral QAC breakfast  . metoprolol tartrate  50 mg Oral BID  . nystatin  5 mL Oral QID  . pneumococcal 23 valent vaccine  0.5 mL Intramuscular Tomorrow-1000  . sodium chloride flush  3 mL Intravenous Q12H  . spironolactone  25 mg Oral Daily   Continuous Infusions: . sodium chloride Stopped (02/14/20 0054)       LOS: 6 days    Time spent: 25 minutes with greater than 50% spent at bedside and in coordination of care.   Ezekiel Slocumb, md [facility]  02/15/2020, 3:17 PM

## 2020-02-15 NOTE — Consult Note (Signed)
Pulmonary Medicine          Date: 02/15/2020,   MRN# [id number] Nicole Schmidt Aug 13, 1965     AdmissionWeight: (!) 163.3 kg                 CurrentWeight: (!) 167.5 kg  Referring physician: Dr Arbutus Ped    CHIEF COMPLAINT:   Persistent respiratory distress with increased O2 requirement   HISTORY OF PRESENT ILLNESS   Nicole Ringwald Gibsonis a 55 y.o.femalewith medical history significant ofPE(2016); afib on Eliquis; morbid obesity (BMI 61.79); hypothyroidism; HTN; DM;OSA on CPAP (autopap);stage 3 CKD; chronic diastolic CHF; and remote breast cancer presenting with SOB. Friday night, she had a dry cough and was around family. She sneezed a lot and then felt congested and so stayed home after. She could tell that she wasn't able to take a deep breath. She has chronic DOE but usually recovers quickly. Since Friday, she has been more SOB. +LE edema, possibly mildly worse than usual. She sleeps in a recliner at baseline. She has gained weight, maybe 20 pounds this year from lack of mobility."In the ED, O2 sat 87 on room air.  Covid and influenza negative.  BNP mildly elevated 197.9.  Leukocytosis 15.4k CT chest without contrast (due to renal function) showed Widely scattered patchy and indistinct nodular pulmonary opacity compatible with acute viral/atypical respiratory infection. No pleural effusion. Superimposed bilateral mosaic attenuation which can be related to small airway or small vessel disease (and history of 2016 PE).Admitted for further evaluation and management of suspected CHF exacerbation, and likely atypical pneumonia.  Initially diuresed with IV Lasix, since stopped due to worsened renal function.  Started on empiric antibiotics for PNA vs bronchitis.Echo showed EG 65-70%, severe LVH, grade II diastolic dysfunction, moderate LA dilation, mild RA dilation. She was previosly on O2 only in 2016 transiently while with acute PE.    PAST MEDICAL HISTORY   Past Medical  History:  Diagnosis Date  . (HFpEF) heart failure with preserved ejection fraction ([place])    a. 10/2018 Echo: EF 60-65%, diast dysfxn, RVSP 50.7 mmHg, mildly dil LA.  Marland Kitchen Acquired hypothyroidism 02/09/2020  . Anemia   . Arthritis   . Breast cancer ([place]) 2014   right breast cancer  . Breast cancer of upper-inner quadrant of right female breast ([place])    Right breast invasive CA and DCIS , 7 mm T1,N0,M0. Er/PR pos, her 2 negative.  Margins 1 mm.  . CHF (congestive heart failure) ([place])   . CKD (chronic kidney disease), stage III ([place])   . Diabetes mellitus type 2 in obese ([place]) 02/09/2020  . Diabetes mellitus without complication ([place])   . Dyslipidemia 02/09/2020  . Essential hypertension   . Gout   . Hypothyroidism   . Menopause    age 51  . Morbid obesity ([place])   . Persistent atrial fibrillation ([place])    a. Dx 10/2018 in setting of PNA. CHA2DS2VASc = 4-->Eliquis; b. 11/2018 s/p successful DCCV.  Marland Kitchen Personal history of radiation therapy   . Pulmonary embolism ([place]) 10/2014   a. Chronic eliquis.  . Thyroid goiter      SURGICAL HISTORY   Past Surgical History:  Procedure Laterality Date  . BREAST BIOPSY Right 2014   breast ca  . BREAST EXCISIONAL BIOPSY Right 07/19/2012   breast ca  . BREAST SURGERY Right 2014   with sentinel node bx subareaolar duct excision  . CARDIOVERSION N/A 11/28/2018   Procedure: CARDIOVERSION;  Surgeon: Ida Rogue  J, MD;  Location: [facility] ORS;  Service: Cardiovascular;  Laterality: N/A;  . COLONOSCOPY WITH PROPOFOL N/A 01/30/2017   Procedure: COLONOSCOPY WITH PROPOFOL;  Surgeon: Christene Lye, MD;  Location: [facility] ENDOSCOPY;  Service: Endoscopy;  Laterality: N/A;     FAMILY HISTORY   Family History  Problem Relation Age of Onset  . Diabetes Father   . Hypertension Father   . Parkinson's disease Father   . Hypertension Mother   . Rheum arthritis Mother   . Atrial fibrillation Mother   . Heart failure Mother   . Heart attack Mother         died in her mid-70's.  . Colon cancer Cousin 52  . Breast cancer Neg Hx      SOCIAL HISTORY   Social History   Tobacco Use  . Smoking status: Never Smoker  . Smokeless tobacco: Never Used  Vaping Use  . Vaping Use: Never used  Substance Use Topics  . Alcohol use: No    Alcohol/week: 0.0 standard drinks  . Drug use: No     MEDICATIONS    Home Medication:    Current Medication:  Current Facility-Administered Medications:  .  0.9 %  sodium chloride infusion, 250 mL, Intravenous, PRN, Karmen Bongo, MD, Stopped at 02/14/20 0054 .  amLODipine (NORVASC) tablet 5 mg, 5 mg, Oral, BID, Karmen Bongo, MD, 5 mg at 02/15/20 1108 .  apixaban (ELIQUIS) tablet 5 mg, 5 mg, Oral, BID, Karmen Bongo, MD, 5 mg at 02/15/20 1108 .  atorvastatin (LIPITOR) tablet 20 mg, 20 mg, Oral, QHS, Karmen Bongo, MD, 20 mg at 02/14/20 2136 .  cloNIDine (CATAPRES) tablet 0.2 mg, 0.2 mg, Oral, BID, Karmen Bongo, MD, 0.2 mg at 02/15/20 1108 .  dextromethorphan-guaiFENesin (MUCINEX DM) 30-600 MG per 12 hr tablet 1 tablet, 1 tablet, Oral, BID, Nicole Kindred A, DO, 1 tablet at 02/15/20 1108 .  folic acid (FOLVITE) tablet 1 mg, 1,000 mcg, Oral, Daily, Karmen Bongo, MD, 1 mg at 02/15/20 1108 .  hydrALAZINE (APRESOLINE) tablet 25 mg, 25 mg, Oral, Q6H PRN, Nicole Kindred A, DO, 25 mg at 02/13/20 0511 .  insulin aspart (novoLOG) injection 0-15 Units, 0-15 Units, Subcutaneous, TID WC, Karmen Bongo, MD, 2 Units at 02/15/20 1314 .  ipratropium-albuterol (DUONEB) 0.5-2.5 (3) MG/3ML nebulizer solution 3 mL, 3 mL, Nebulization, BID, Enzo Bi, MD, 3 mL at 02/15/20 0704 .  levothyroxine (SYNTHROID) tablet 112 mcg, 112 mcg, Oral, QAC breakfast, Karmen Bongo, MD, 112 mcg at 02/15/20 0542 .  melatonin tablet 10 mg, 10 mg, Oral, QHS PRN, Karmen Bongo, MD, 10 mg at 02/14/20 2344 .  metoprolol tartrate (LOPRESSOR) tablet 50 mg, 50 mg, Oral, BID, Karmen Bongo, MD, 50 mg at 02/15/20 1108 .  nystatin  (MYCOSTATIN) 100000 UNIT/ML suspension 500,000 Units, 5 mL, Oral, QID, Karmen Bongo, MD, 500,000 Units at 02/15/20 1314 .  pneumococcal 23 valent vaccine (PNEUMOVAX-23) injection 0.5 mL, 0.5 mL, Intramuscular, Tomorrow-1000, Enzo Bi, MD .  sodium chloride flush (NS) 0.9 % injection 3 mL, 3 mL, Intravenous, Q12H, Karmen Bongo, MD, 3 mL at 02/14/20 2137 .  sodium chloride flush (NS) 0.9 % injection 3 mL, 3 mL, Intravenous, PRN, Karmen Bongo, MD    ALLERGIES   Patient has no known allergies.     REVIEW OF SYSTEMS    Review of Systems:  Gen:  Denies  fever, sweats, chills weigh loss  HEENT: Denies blurred vision, double vision, ear pain, eye pain, hearing loss, nose bleeds, sore throat Cardiac:  No dizziness, chest pain or heaviness, chest tightness,edema Resp:   Denies cough or sputum porduction, shortness of breath,wheezing, hemoptysis,  Gi: Denies swallowing difficulty, stomach pain, nausea or vomiting, diarrhea, constipation, bowel incontinence Gu:  Denies bladder incontinence, burning urine Ext:   Denies Joint pain, stiffness or swelling Skin: Denies  skin rash, easy bruising or bleeding or hives Endoc:  Denies polyuria, polydipsia , polyphagia or weight change Psych:   Denies depression, insomnia or hallucinations   Other:  All other systems negative   VS: BP (!) 155/63 (BP Location: Left Leg)   Pulse 80   Temp 97.8 F (36.6 C) (Oral)   Resp 18   Ht 5\' 4"  (1.626 m)   Wt (!) 167.5 kg   LMP 07/17/2012   SpO2 95%   BMI 63.39 kg/m      PHYSICAL EXAM    GENERAL:NAD, no fevers, chills, no weakness no fatigue HEAD: Normocephalic, atraumatic.  EYES: Pupils equal, round, reactive to light. Extraocular muscles intact. No scleral icterus.  MOUTH: Moist mucosal membrane. Dentition intact. No abscess noted.  EAR, NOSE, THROAT: Clear without exudates. No external lesions.  NECK: Supple. No thyromegaly. No nodules. No JVD.  PULMONARY: decreased air entry  bilaterally CARDIOVASCULAR: S1 and S2. Regular rate and rhythm. No murmurs, rubs, or gallops. No edema. Pedal pulses 2+ bilaterally.  GASTROINTESTINAL: Soft, nontender, nondistended. No masses. Positive bowel sounds. No hepatosplenomegaly.  MUSCULOSKELETAL: No swelling, clubbing, or edema. Range of motion full in all extremities.  NEUROLOGIC: Cranial nerves II through XII are intact. No gross focal neurological deficits. Sensation intact. Reflexes intact.  SKIN: No ulceration, lesions, rashes, or cyanosis. Skin warm and dry. Turgor intact.  PSYCHIATRIC: Mood, affect within normal limits. The patient is awake, alert and oriented x 3. Insight, judgment intact.       IMAGING    DG Chest 2 View  Result Date: 02/09/2020 CLINICAL DATA:  Cough and congestion. EXAM: CHEST - 2 VIEW COMPARISON:  October 20, 2018 FINDINGS: Mild, diffuse chronic appearing increased interstitial lung markings are seen. Mild, stable linear scarring and/or atelectasis is noted within the left lung base. There is no evidence of a pleural effusion or pneumothorax. The heart size and mediastinal contours are within normal limits. There is moderate severity calcification of the aortic arch. The visualized skeletal structures are unremarkable. IMPRESSION: Chronic appearing increased interstitial lung markings with stable left basilar linear scarring and/or atelectasis. Electronically Signed   By: Virgina Norfolk M.D.   On: 02/09/2020 02:30   CT Chest Wo Contrast  Result Date: 02/09/2020 CLINICAL DATA:  55 year old female with cough and congestion. EXAM: CT CHEST WITHOUT CONTRAST TECHNIQUE: Multidetector CT imaging of the chest was performed following the standard protocol without IV contrast. COMPARISON:  Chest radiographs earlier today.  Chest CTA 10/18/2014. FINDINGS: Cardiovascular: Calcified aortic atherosclerosis. Calcified coronary artery atherosclerosis. Vascular patency is not evaluated in the absence of IV contrast. No  cardiomegaly or pericardial effusion. Mediastinum/Nodes: Negative.  No lymphadenopathy. Lungs/Pleura: Bilateral PE demonstrated in 2016. Confluent linear opacity in the left lower lobe could be atelectasis or chronic pulmonary scarring. Major airways remain patent. There is widely scattered bilateral patchy and indistinct pulmonary nodular opacity (series 3, image 76 bilaterally) with additional superimposed pulmonary ground-glass opacity or mosaic attenuation. No pleural effusion. Upper Abdomen: Questionable hepatomegaly but otherwise negative visible noncontrast liver, spleen, pancreas, right adrenal gland. Relatively low-density 23 mm left adrenal nodule, was not included on the 2016 CTA. Negative visible upper abdominal bowel. Musculoskeletal: Widespread thoracic spine  ankylosis from flowing endplate osteophytes or syndesmophytes. Chronic right posterior 10th rib fracture with cortical thickening is new since 2016. No acute osseous abnormality identified. IMPRESSION: 1. Widely scattered patchy and indistinct nodular pulmonary opacity compatible with acute viral/atypical respiratory infection. No pleural effusion. Superimposed bilateral mosaic attenuation which can be related to small airway or small vessel disease (and history of 2016 PE). 2. A 2.3 cm left adrenal nodule is indeterminate on noncontrast CT but probably benign. Recommend follow-up Adrenal protocol Abdomen CT (without and with contrast) in 1 year. This recommendation follows ACR consensus guidelines: Management of Incidental Adrenal Masses: A White Paper of the ACR Incidental Findings Committee. J Am Coll Radiol 2017;14:1038-1044. 3. Aortic Atherosclerosis (ICD10-I70.0). Calcified coronary artery atherosclerosis. Electronically Signed   By: Genevie Ann M.D.   On: 02/09/2020 06:22   ECHOCARDIOGRAM COMPLETE  Result Date: 02/09/2020    ECHOCARDIOGRAM REPORT   Patient Name:   Nicole Schmidt Date of Exam: 02/09/2020 Medical Rec #:  [id number]       Height:       64.0 in Accession #:    [id number]     Weight:       360.0 lb Date of Birth:  12/23/1965       BSA:          2.512 m Patient Age:    27 years       BP:           157/61 mmHg Patient Gender: F              HR:           65 bpm. Exam Location:  [facility] Procedure: 2D Echo, Cardiac Doppler and Color Doppler Indications:     CHF- acute systolic 660.63  History:         Patient has prior history of Echocardiogram examinations, most                  recent 10/20/2018. Risk Factors:Hypertension. Pulmonary embolus.  Sonographer:     Sherrie Sport RDCS (AE) Referring Phys:  [id number] Karmen Bongo Diagnosing Phys: Harrell Gave End MD  Sonographer Comments: Technically difficult study due to poor echo windows, suboptimal apical window and suboptimal subcostal window. IMPRESSIONS  1. Left ventricular ejection fraction, by estimation, is 65 to 70%. The left ventricle has normal function. Left ventricular endocardial border not optimally defined to evaluate regional wall motion. There is severe left ventricular hypertrophy. Left ventricular diastolic parameters are consistent with Grade II diastolic dysfunction (pseudonormalization). Elevated left atrial pressure.  2. Right ventricular systolic function is normal. The right ventricular size is normal.  3. Left atrial size was moderately dilated.  4. Right atrial size was mildly dilated.  5. The mitral valve is grossly normal. No evidence of mitral valve regurgitation.  6. The aortic valve was not well visualized. Aortic valve regurgitation is not visualized. No aortic stenosis is present. FINDINGS  Left Ventricle: Left ventricular ejection fraction, by estimation, is 65 to 70%. The left ventricle has normal function. Left ventricular endocardial border not optimally defined to evaluate regional wall motion. The left ventricular internal cavity size was normal in size. There is severe left ventricular hypertrophy. Left ventricular diastolic parameters are consistent with Grade II  diastolic dysfunction (pseudonormalization). Elevated left atrial pressure. Right Ventricle: The right ventricular size is normal. No increase in right ventricular wall thickness. Right ventricular systolic function is normal. Left Atrium: Left atrial size was moderately dilated. Right Atrium: Right atrial size was  mildly dilated. Pericardium: The pericardium was not well visualized. Mitral Valve: The mitral valve is grossly normal. No evidence of mitral valve regurgitation. Tricuspid Valve: The tricuspid valve is not well visualized. Tricuspid valve regurgitation is trivial. Aortic Valve: The aortic valve was not well visualized. Aortic valve regurgitation is not visualized. No aortic stenosis is present. Aortic valve mean gradient measures 9.0 mmHg. Aortic valve peak gradient measures 15.8 mmHg. Aortic valve area, by VTI measures 2.62 cm. Pulmonic Valve: The pulmonic valve was not well visualized. Pulmonic valve regurgitation is not visualized. No evidence of pulmonic stenosis. Aorta: The aortic root and ascending aorta are structurally normal, with no evidence of dilitation. Pulmonary Artery: The pulmonary artery is not well seen. IAS/Shunts: The interatrial septum was not well visualized.  LEFT VENTRICLE PLAX 2D LVIDd:         4.42 cm  Diastology LVIDs:         2.42 cm  LV e' medial:    5.77 cm/s LV PW:         1.71 cm  LV E/e' medial:  20.1 LV IVS:        1.60 cm  LV e' lateral:   11.10 cm/s LVOT diam:     2.10 cm  LV E/e' lateral: 10.5 LV SV:         107 LV SV Index:   43 LVOT Area:     3.46 cm  RIGHT VENTRICLE RV Basal diam:  3.42 cm RV S prime:     13.70 cm/s TAPSE (M-mode): 4.4 cm LEFT ATRIUM            Index       RIGHT ATRIUM           Index LA diam:      4.20 cm  1.67 cm/m  RA Area:     18.00 cm LA Vol (A2C): 71.6 ml  28.51 ml/m RA Volume:   40.40 ml  16.09 ml/m LA Vol (A4C): 107.0 ml 42.60 ml/m  AORTIC VALVE                    PULMONIC VALVE AV Area (Vmax):    2.33 cm     PV Vmax:        0.92  m/s AV Area (Vmean):   2.32 cm     PV Peak grad:   3.4 mmHg AV Area (VTI):     2.62 cm     RVOT Peak grad: 4 mmHg AV Vmax:           199.00 cm/s AV Vmean:          140.000 cm/s AV VTI:            0.409 m AV Peak Grad:      15.8 mmHg AV Mean Grad:      9.0 mmHg LVOT Vmax:         134.00 cm/s LVOT Vmean:        93.800 cm/s LVOT VTI:          0.309 m LVOT/AV VTI ratio: 0.76  AORTA Ao Root diam: 2.97 cm MITRAL VALVE                TRICUSPID VALVE MV Area (PHT): 3.20 cm     TR Peak grad:   16.2 mmHg MV Decel Time: 237 msec     TR Vmax:        201.00 cm/s MV E velocity: 116.00 cm/s MV A velocity:  79.10 cm/s   SHUNTS MV E/A ratio:  1.47         Systemic VTI:  0.31 m                             Systemic Diam: 2.10 cm Nelva Bush MD Electronically signed by Nelva Bush MD Signature Date/Time: 02/09/2020/1:01:59 PM    Final       ASSESSMENT/PLAN   Acute on chronic hypoxemic respiratory failure -patient is net 8L negative -patient has pulmonary edema on above CT chest , will order CXR for interval changes - patient has bilateral 2+pitting LE edema and stasis dermatitis - will add aldactone to regimen -Bibasilar atelectasis - patient able to take tidal volume of 750 during examination    Bibasilar atelectasis  - encourage patient to use IS - currently only at 750 cc tidal volume  - initiate metaNEB - Twice daily   - PT and OT prior to d/c    OSA on cpap  - patient may use our device - she shares while using CPAP here she had high amount of air leakage from full face mask inteface which caused sleep disturbances.    Thank you for allowing me to participate in the care of this patient.  Patient/Family are satisfied with care plan and all questions have been answered.  This document was prepared using Dragon voice recognition software and may include unintentional dictation errors.     Ottie Glazier, M.D.  Division of [place]

## 2020-02-16 DIAGNOSIS — I5033 Acute on chronic diastolic (congestive) heart failure: Secondary | ICD-10-CM

## 2020-02-16 DIAGNOSIS — N1832 Chronic kidney disease, stage 3b: Secondary | ICD-10-CM

## 2020-02-16 DIAGNOSIS — I10 Essential (primary) hypertension: Secondary | ICD-10-CM | POA: Diagnosis not present

## 2020-02-16 DIAGNOSIS — J9601 Acute respiratory failure with hypoxia: Secondary | ICD-10-CM | POA: Diagnosis not present

## 2020-02-16 LAB — CBC
HCT: 36.3 % (ref 36.0–46.0)
Hemoglobin: 10.8 g/dL — ABNORMAL LOW (ref 12.0–15.0)
MCH: 27.8 pg (ref 26.0–34.0)
MCHC: 29.8 g/dL — ABNORMAL LOW (ref 30.0–36.0)
MCV: 93.3 fL (ref 80.0–100.0)
Platelets: 237 10*3/uL (ref 150–400)
RBC: 3.89 MIL/uL (ref 3.87–5.11)
RDW: 14 % (ref 11.5–15.5)
WBC: 13.3 10*3/uL — ABNORMAL HIGH (ref 4.0–10.5)
nRBC: 0 % (ref 0.0–0.2)

## 2020-02-16 LAB — GLUCOSE, CAPILLARY
Glucose-Capillary: 99 mg/dL (ref 70–99)
Glucose-Capillary: 176 mg/dL — ABNORMAL HIGH (ref 70–99)
Glucose-Capillary: 136 mg/dL — ABNORMAL HIGH (ref 70–99)
Glucose-Capillary: 124 mg/dL — ABNORMAL HIGH (ref 70–99)

## 2020-02-16 LAB — BASIC METABOLIC PANEL
Anion gap: 7 (ref 5–15)
BUN: 51 mg/dL — ABNORMAL HIGH (ref 6–20)
CO2: 30 mmol/L (ref 22–32)
Calcium: 9.1 mg/dL (ref 8.9–10.3)
Chloride: 107 mmol/L (ref 98–111)
Creatinine, Ser: 1.72 mg/dL — ABNORMAL HIGH (ref 0.44–1.00)
GFR, Estimated: 34 mL/min — ABNORMAL LOW (ref >60)
Glucose, Bld: 117 mg/dL — ABNORMAL HIGH (ref 70–99)
Potassium: 5.2 mmol/L — ABNORMAL HIGH (ref 3.5–5.1)
Sodium: 144 mmol/L (ref 135–145)

## 2020-02-16 LAB — MAGNESIUM: Magnesium: 2.3 mg/dL (ref 1.7–2.4)

## 2020-02-16 MED ORDER — FUROSEMIDE 10 MG/ML IJ SOLN
40.0000 mg | Freq: Once | INTRAMUSCULAR | Status: AC
  Administered 2020-02-16: 40 mg via INTRAVENOUS
  Filled 2020-02-16: qty 4

## 2020-02-16 MED ORDER — FUROSEMIDE 10 MG/ML IJ SOLN
40.0000 mg | Freq: Two times a day (BID) | INTRAMUSCULAR | Status: DC
  Administered 2020-02-16 – 2020-02-17 (×3): 40 mg via INTRAVENOUS
  Filled 2020-02-16 (×3): qty 4

## 2020-02-16 NOTE — Progress Notes (Incomplete)
Mobility Specialist - Progress Note   02/16/20 0800  Mobility  Activity Contraindicated/medical hold  Mobility performed by Mobility specialist    Per chart review, pt currently with elevated K+ (5.2) sitting outside safety guidelines for exertional activity. Will attempt session at another date when appropriate.    Kathee Delton Mobility Specialist 02/16/20, 8:53 AM

## 2020-02-16 NOTE — Progress Notes (Signed)
Mobility Specialist - Progress Note   02/16/20 0800  Mobility  Activity Contraindicated/medical hold  Mobility performed by Mobility specialist    Per chart review, pt currently with critically elevated K+ (5.2), sitting outside safety guidelines for exertional activity. Will attempt session another date when appropriate.    Kathee Delton Mobility Specialist 02/16/20, 8:59 AM

## 2020-02-16 NOTE — Progress Notes (Signed)
PT Cancellation Note  Patient Details Name: Nicole Schmidt MRN: [id number] DOB: 1965-12-30   Cancelled Treatment:    Reason Eval/Treat Not Completed: Other (comment);Medical issues which prohibited therapy.  Patient noted with critically elevated K+ (5.2), Per PT practice guidelines pt is contraindicated for exertional activity at this time.  Will continue efforts next date pending medical stability and appropriateness.  Lieutenant Diego PT, DPT 8:26 AM,02/16/20

## 2020-02-16 NOTE — Progress Notes (Addendum)
PROGRESS NOTE    Nicole Schmidt   PFX:[id number]  DOB: [birth year]  PCP: Lenard Simmer, MD    DOA: 02/09/2020 LOS: 7   Brief Narrative   Per H&P by Dr. Lorin Mercy: "Nicole Schmidt is a 55 y.o. female with medical history significant of PE (2016); afib on Eliquis; morbid obesity (BMI 61.79); hypothyroidism; HTN; DM; OSA on CPAP (autopap); stage 3 CKD; chronic diastolic CHF; and remote breast cancer presenting with SOB.  Friday night, she had a dry cough and was around family.  She sneezed a lot and then felt congested and so stayed home after.  She could tell that she wasn't able to take a deep breath.  She has chronic DOE but usually recovers quickly.  Since Friday, she has been more SOB.  +LE edema, possibly mildly worse than usual.  She sleeps in a recliner at baseline.  She has gained weight, maybe 20 pounds this year from lack of mobility."   In the ED, O2 sat 87 on room air.  Covid and influenza negative.  BNP mildly elevated 197.9.  Leukocytosis 15.4k CT chest without contrast (due to renal function) showed Widely scattered patchy and indistinct nodular pulmonary opacity compatible with acute viral/atypical respiratory infection. No pleural effusion. Superimposed bilateral mosaic attenuation which can be related to small airway or small vessel disease (and history of 2016 PE).  Admitted for further evaluation and management of suspected CHF exacerbation, and likely atypical pneumonia.  Initially diuresed with IV Lasix, since stopped due to worsened renal function.  Started on empiric antibiotics for PNA vs bronchitis.  Echo showed EG 65-70%, severe LVH, grade II diastolic dysfunction, moderate LA dilation, mild RA dilation.    Assessment & Plan   Principal Problem:   Acute hypoxemic respiratory failure (HCC) Active Problems:   History of breast cancer   Chronic renal disease, stage III (HCC)   Essential hypertension   Chronic diastolic heart failure (HCC)   OSA on CPAP   Morbid  obesity (HCC)   Atrial fibrillation, chronic (HCC)   Dyslipidemia   Diabetes mellitus type 2 in obese ([place])   Acquired hypothyroidism   Thrush, oral   Acute respiratory failure with hypoxia due to ?Pneumonia/bronchitis and acute on chronic diastolic CHF  - primary symptom of dyspnea on exertion above baseline, slightly worse edema, chest congestion.  Has hx of PE on Eliquis, so less likely PE.  --documented O2 sat of 87% on presentation. --CT chest showed "Widely scattered patchy and indistinct nodular pulmonary opacity compatible with acute viral/atypical respiratory infection. No pleural effusion."  COVID neg, RVP neg. --BNP 197, which is similar to prior.  Not showing signs of fluid overload, more PNA. --Echo on 12/27 with preserved EF, grade 2 diastolic dysfunction and severe LVH --started on empiric abx on admission, completed 5 days of ceftriaxone. --Continue to have need for supplemental oxygen after completing antibiotics, continued lower extremity edema over her baseline as well, resumed on IV diuretic 1/2 Plan: --Consulted pulmonary --Given IV Lasix yesterday with excellent effect.  Lasix 40 mg twice daily. --Strict I/O's Daily weights --Will monitor renal function and electrolytes closely --cont scheduled DuoNeb --Continue supplemental O2 to keep sats >=90%, wean as tolerated --IS, MetaNebs   Hyperkalemia - had been started on Aldactone per pulmonary yesterday.  K5.2 this morning.  Likely also result of renal failure.  Stop Aldactone for now, repeat BMP in the morning  Hypertension  - on home amlodipine, clonidine, metop and losartan --cont home regimen except  for losartan  --PRN hydralazine.  Hyperlipidemia  --Lipid panel with TG's 152 otherwise normal. --cont home statin  Type 2 Diabetes  - last A1c 7.1% in 10/2018, improved with current A1c of 6.4%.   --Hold Dulaglutide.  --cont SSI  Hx of PE on Eliquis --cont home Eliquis  Hx of A-fib on Eliquis - rate  controlled.   --cont home metop and eliquis  Hypothyroidism  - continue Synthroid.    Thrush - Mycostatin   Stage 3b CKD - stable on admission.  Slight bump in Cr with IV diuretic.   --Hold diuresis for now --Hold home losartan for now  OSA  - nursing noted pt refusing nightly CPAP/autopap  Hx of Breast cancer - in remission, last mammogram on 11/21/19.  Has ongoing yearly surveillance.  Morbid obesity - Body mass index is 63.39 kg/m. Complicates overall care and prognosis.  Recommend lifestyle modifications, consider bariatric referral.  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  Likely benign based on appearance.   Recommendations is follow up CT scan with and without contrast in 1 year.   DVT prophylaxis:  apixaban (ELIQUIS) tablet 5 mg   Diet:  Diet Orders (From admission, onward)    Start     Ordered   02/09/20 1005  Diet Carb Modified Fluid consistency: Thin; Room service appropriate? Yes  Diet effective now       Question Answer Comment  Diet-HS Snack? Nothing   Calorie Level Medium 1600-2000   Fluid consistency: Thin   Room service appropriate? Yes      02/09/20 1006            Code Status: Full Code    Subjective 02/16/20    Patient up in chair when seen today.  She reports significant increasing urinary output/frequency but still having dyspnea on exertion.  No fevers or chills or chest pain.  Disposition Plan & Communication   DVT prophylaxis: [id number] Code Status: Full code  Family Communication:  Status is: inpatient Dispo:   The patient is from: home Anticipated d/c is to: home Anticipated d/c date is: 1-2 days  Patient currently is not medically stable to d/c due to: still on 2L O2 and undergoing IV diuresis as above.   Consults, Procedures, Significant Events   Consultants:   None  Procedures:   Echo EF 65-70%, grade II diastolic dysfunction, severe LVH  Antimicrobials:  Anti-infectives (From admission,  onward)   Start     Dose/Rate Route Frequency Ordered Stop   02/11/20 2000  cefTRIAXone (ROCEPHIN) 2 g in sodium chloride 0.9 % 100 mL IVPB  Status:  Discontinued        2 g 200 mL/hr over 30 Minutes Intravenous Every 24 hours 02/11/20 1343 02/15/20 0712   02/10/20 1900  azithromycin (ZITHROMAX) 500 mg in sodium chloride 0.9 % 250 mL IVPB        500 mg 250 mL/hr over 60 Minutes Intravenous Every 24 hours 02/10/20 1804 02/12/20 1829   02/10/20 1900  cefTRIAXone (ROCEPHIN) 1 g in sodium chloride 0.9 % 100 mL IVPB  Status:  Discontinued        1 g 200 mL/hr over 30 Minutes Intravenous Every 24 hours 02/10/20 1804 02/11/20 1343   02/09/20 0630  azithromycin (ZITHROMAX) 500 mg in sodium chloride 0.9 % 250 mL IVPB        500 mg 250 mL/hr over 60 Minutes Intravenous  Once 02/09/20 0628 02/09/20 0950  Objective   Vitals:   02/15/20 2022 02/16/20 0425 02/16/20 0910 02/16/20 1038  BP: (!) 156/74 (!) 169/92 (!) 155/66   Pulse: 72 66 80   Resp:  20 18   Temp: (!) 97.5 F (36.4 C) 98.4 F (36.9 C) 98.1 F (36.7 C)   TempSrc: Oral Oral Oral   SpO2: 97% 95% 100% 100%  Weight:      Height:        Intake/Output Summary (Last 24 hours) at 02/16/2020 1447 Last data filed at 02/16/2020 1407 Gross per 24 hour  Intake 240 ml  Output 3900 ml  Net -3660 ml   Filed Weights   02/11/20 0500 02/12/20 0108 02/14/20 0500  Weight: (!) 165.1 kg (!) 169.5 kg (!) 167.5 kg    Physical Exam:  Constitutional: Awake and alert, no acute distress, obese CV: RRR, no murmurs heard, 2+ lower extremity edema RESP: normal respiratory effort, on 2L/min nasal cannula oxygen Extremities: 2+ lower extremity pitting edema bilaterally, mild venous stasis hyperpigmentation of distal lower extremities, moves all extremities Neuro: Normal speech, grossly nonfocal exam  Psych: Normal mood and affect.  Appropriate judgement and reason    Labs   Data Reviewed: I have personally reviewed following labs and  imaging studies  CBC: Recent Labs  Lab 02/10/20 0320 02/11/20 0522 02/12/20 0529 02/13/20 0356 02/14/20 0507 02/15/20 0533 02/16/20 0541  WBC 14.2* 12.5* 11.4* 12.6* 11.6* 11.7* 13.3*  NEUTROABS 10.3* 8.5*  --   --   --   --   --   HGB 10.9* 10.6* 10.1* 10.3* 9.8* 10.0* 10.8*  HCT 36.4 35.9* 34.2* 34.4* 33.7* 34.2* 36.3  MCV 92.9 95.0 94.0 93.7 93.9 94.2 93.3  PLT 226 209 206 218 220 207 188   Basic Metabolic Panel: Recent Labs  Lab 02/12/20 0529 02/13/20 0356 02/14/20 0507 02/15/20 0533 02/16/20 0541  NA 143 145 143 144 144  K 4.6 4.9 4.8 5.0 5.2*  CL 105 106 105 106 107  CO2 28 31 30 29 30   GLUCOSE 155* 116* 111* 123* 117*  BUN 57* 56* 52* 48* 51*  CREATININE 2.05* 1.94* 1.75* 1.66* 1.72*  CALCIUM 8.3* 8.5* 8.7* 8.9 9.1  MG 2.3 2.3 2.4 2.3 2.3   GFR: Estimated Creatinine Clearance: 57.5 mL/min (A) (by C-G formula based on SCr of 1.72 mg/dL (H)). Liver Function Tests: No results for input(s): AST, ALT, ALKPHOS, BILITOT, PROT, ALBUMIN in the last 168 hours. No results for input(s): LIPASE, AMYLASE in the last 168 hours. No results for input(s): AMMONIA in the last 168 hours. Coagulation Profile: No results for input(s): INR, PROTIME in the last 168 hours. Cardiac Enzymes: No results for input(s): CKTOTAL, CKMB, CKMBINDEX, TROPONINI in the last 168 hours. BNP (last 3 results) No results for input(s): PROBNP in the last 8760 hours. HbA1C: No results for input(s): HGBA1C in the last 72 hours. CBG: Recent Labs  Lab 02/15/20 1224 02/15/20 1630 02/15/20 2132 02/16/20 0801 02/16/20 1132  GLUCAP 137* 151* 103* 99 176*   Lipid Profile: No results for input(s): CHOL, HDL, LDLCALC, TRIG, CHOLHDL, LDLDIRECT in the last 72 hours. Thyroid Function Tests: No results for input(s): TSH, T4TOTAL, FREET4, T3FREE, THYROIDAB in the last 72 hours. Anemia Panel: No results for input(s): VITAMINB12, FOLATE, FERRITIN, TIBC, IRON, RETICCTPCT in the last 72 hours. Sepsis  Labs: Recent Labs  Lab 02/10/20 0320  PROCALCITON <0.10    Recent Results (from the past 240 hour(s))  Resp Panel by RT-PCR (Flu A&B, Covid) Nasopharyngeal Swab  Status: None   Collection Time: 02/09/20  2:00 AM   Specimen: Nasopharyngeal Swab; Nasopharyngeal(NP) swabs in vial transport medium  Result Value Ref Range Status   SARS Coronavirus 2 by RT PCR NEGATIVE NEGATIVE Final    Comment: (NOTE) SARS-CoV-2 target nucleic acids are NOT DETECTED.  The SARS-CoV-2 RNA is generally detectable in upper respiratory specimens during the acute phase of infection. The lowest concentration of SARS-CoV-2 viral copies this assay can detect is 138 copies/mL. A negative result does not preclude SARS-Cov-2 infection and should not be used as the sole basis for treatment or other patient management decisions. A negative result may occur with  improper specimen collection/handling, submission of specimen other than nasopharyngeal swab, presence of viral mutation(s) within the areas targeted by this assay, and inadequate number of viral copies(<138 copies/mL). A negative result must be combined with clinical observations, patient history, and epidemiological information. The expected result is Negative.  Fact Sheet for Patients:  EntrepreneurPulse.com.au  Fact Sheet for Healthcare Providers:  IncredibleEmployment.be  This test is no t yet approved or cleared by the [place] FDA and  has been authorized for detection and/or diagnosis of SARS-CoV-2 by FDA under an Emergency Use Authorization (EUA). This EUA will remain  in effect (meaning this test can be used) for the duration of the COVID-19 declaration under Section 564(b)(1) of the Act, 21 U.S.C.section 360bbb-3(b)(1), unless the authorization is terminated  or revoked sooner.       Influenza A by PCR NEGATIVE NEGATIVE Final   Influenza B by PCR NEGATIVE NEGATIVE Final    Comment: (NOTE) The  Xpert Xpress SARS-CoV-2/FLU/RSV plus assay is intended as an aid in the diagnosis of influenza from Nasopharyngeal swab specimens and should not be used as a sole basis for treatment. Nasal washings and aspirates are unacceptable for Xpert Xpress SARS-CoV-2/FLU/RSV testing.  Fact Sheet for Patients: EntrepreneurPulse.com.au  Fact Sheet for Healthcare Providers: IncredibleEmployment.be  This test is not yet approved or cleared by the [place] FDA and has been authorized for detection and/or diagnosis of SARS-CoV-2 by FDA under an Emergency Use Authorization (EUA). This EUA will remain in effect (meaning this test can be used) for the duration of the COVID-19 declaration under Section 564(b)(1) of the Act, 21 U.S.C. section 360bbb-3(b)(1), unless the authorization is terminated or revoked.  Performed at [facility], [address] [place], [place] [address]   Respiratory (~20 pathogens) panel by PCR     Status: None   Collection Time: 02/11/20 12:20 PM   Specimen: Nasopharyngeal Swab; Respiratory  Result Value Ref Range Status   Adenovirus NOT DETECTED NOT DETECTED Final   Coronavirus 229E NOT DETECTED NOT DETECTED Final    Comment: (NOTE) The Coronavirus on the Respiratory Panel, DOES NOT test for the novel  Coronavirus (2019 nCoV)    Coronavirus HKU1 NOT DETECTED NOT DETECTED Final   Coronavirus NL63 NOT DETECTED NOT DETECTED Final   Coronavirus OC43 NOT DETECTED NOT DETECTED Final   Metapneumovirus NOT DETECTED NOT DETECTED Final   Rhinovirus / Enterovirus NOT DETECTED NOT DETECTED Final   Influenza A NOT DETECTED NOT DETECTED Final   Influenza B NOT DETECTED NOT DETECTED Final   Parainfluenza Virus 1 NOT DETECTED NOT DETECTED Final   Parainfluenza Virus 2 NOT DETECTED NOT DETECTED Final   Parainfluenza Virus 3 NOT DETECTED NOT DETECTED Final   Parainfluenza Virus 4 NOT DETECTED NOT DETECTED Final   Respiratory Syncytial  Virus NOT DETECTED NOT DETECTED Final   Bordetella pertussis  NOT DETECTED NOT DETECTED Final   Bordetella Parapertussis NOT DETECTED NOT DETECTED Final   Chlamydophila pneumoniae NOT DETECTED NOT DETECTED Final   Mycoplasma pneumoniae NOT DETECTED NOT DETECTED Final    Comment: Performed at [facility], [place] [address] [place], [place] [address]      Imaging Studies   DG Chest Port 1 View  Result Date: 02/15/2020 CLINICAL DATA:  Respiratory failure EXAM: PORTABLE CHEST 1 VIEW COMPARISON:  02/09/2020 FINDINGS: Cardiomegaly. Interstitial prominence and patchy bilateral airspace opacities, slightly worsened since prior study. No effusions or pneumothorax. IMPRESSION: Worsening interstitial prominence and patchy bilateral airspace disease. Electronically Signed   By: Rolm Baptise M.D.   On: 02/15/2020 14:22     Medications   Scheduled Meds: . apixaban  5 mg Oral BID  . atorvastatin  20 mg Oral QHS  . cloNIDine  0.2 mg Oral BID  . dextromethorphan-guaiFENesin  1 tablet Oral BID  . folic acid  4,034 mcg Oral Daily  . insulin aspart  0-15 Units Subcutaneous TID WC  . ipratropium-albuterol  3 mL Nebulization BID  . levothyroxine  112 mcg Oral QAC breakfast  . metoprolol tartrate  50 mg Oral BID  . nystatin  5 mL Oral QID  . sodium chloride flush  3 mL Intravenous Q12H   Continuous Infusions: . sodium chloride Stopped (02/14/20 0054)       LOS: 7 days    Time spent: 25 minutes with greater than 50% spent at bedside and in coordination of care.   Ezekiel Slocumb, md [facility]  02/16/2020, 2:47 PM

## 2020-02-16 NOTE — Progress Notes (Signed)
Pulmonary Medicine          Date: 02/16/2020,   MRN# [id number] Nicole Schmidt 27-Mar-1965     AdmissionWeight: (!) 163.3 kg                 CurrentWeight: (!) 167.5 kg  Referring physician: Dr Arbutus Ped    CHIEF COMPLAINT:   Persistent respiratory distress with increased O2 requirement   HISTORY OF PRESENT ILLNESS   Nicole Arambula Gibsonis a 55 y.o.femalewith medical history significant ofPE(2016); afib on Eliquis; morbid obesity (BMI 61.79); hypothyroidism; HTN; DM;OSA on CPAP (autopap);stage 3 CKD; chronic diastolic CHF; and remote breast cancer presenting with SOB. Friday night, she had a dry cough and was around family. She sneezed a lot and then felt congested and so stayed home after. She could tell that she wasn't able to take a deep breath. She has chronic DOE but usually recovers quickly. Since Friday, she has been more SOB. +LE edema, possibly mildly worse than usual. She sleeps in a recliner at baseline. She has gained weight, maybe 20 pounds this year from lack of mobility."In the ED, O2 sat 87 on room air.  Covid and influenza negative.  BNP mildly elevated 197.9.  Leukocytosis 15.4k CT chest without contrast (due to renal function) showed Widely scattered patchy and indistinct nodular pulmonary opacity compatible with acute viral/atypical respiratory infection. No pleural effusion. Superimposed bilateral mosaic attenuation which can be related to small airway or small vessel disease (and history of 2016 PE).Admitted for further evaluation and management of suspected CHF exacerbation, and likely atypical pneumonia.  Initially diuresed with IV Lasix, since stopped due to worsened renal function.  Started on empiric antibiotics for PNA vs bronchitis.Echo showed EG 65-70%, severe LVH, grade II diastolic dysfunction, moderate LA dilation, mild RA dilation. She was previosly on O2 only in 2016 transiently while with acute PE.    02/16/19- Nicole Schmidt is improved overnight.   She diuresed well with aldactone and lasix and now transitioned to bid lasix with net 3600 UOP overnight and total >12L negative.  She was able to be weaned to 1.5L/min [place].  She works with RT using [place] and found this to be challenging but effective.  Plan to continue current care plan and PT/OT.   PAST MEDICAL HISTORY   Past Medical History:  Diagnosis Date  . (HFpEF) heart failure with preserved ejection fraction ([place])    a. 10/2018 Echo: EF 60-65%, diast dysfxn, RVSP 50.7 mmHg, mildly dil LA.  Marland Kitchen Acquired hypothyroidism 02/09/2020  . Anemia   . Arthritis   . Breast cancer ([place]) 2014   right breast cancer  . Breast cancer of upper-inner quadrant of right female breast ([place])    Right breast invasive CA and DCIS , 7 mm T1,N0,M0. Er/PR pos, her 2 negative.  Margins 1 mm.  . CHF (congestive heart failure) ([place])   . CKD (chronic kidney disease), stage III ([place])   . Diabetes mellitus type 2 in obese ([place]) 02/09/2020  . Diabetes mellitus without complication ([place])   . Dyslipidemia 02/09/2020  . Essential hypertension   . Gout   . Hypothyroidism   . Menopause    age 47  . Morbid obesity ([place])   . Persistent atrial fibrillation ([place])    a. Dx 10/2018 in setting of PNA. CHA2DS2VASc = 4-->Eliquis; b. 11/2018 s/p successful DCCV.  Marland Kitchen Personal history of radiation therapy   . Pulmonary embolism ([place]) 10/2014   a. Chronic eliquis.  . Thyroid goiter  SURGICAL HISTORY   Past Surgical History:  Procedure Laterality Date  . BREAST BIOPSY Right 2014   breast ca  . BREAST EXCISIONAL BIOPSY Right 07/19/2012   breast ca  . BREAST SURGERY Right 2014   with sentinel node bx subareaolar duct excision  . CARDIOVERSION N/A 11/28/2018   Procedure: CARDIOVERSION;  Surgeon: Minna Merritts, MD;  Location: [facility] ORS;  Service: Cardiovascular;  Laterality: N/A;  . COLONOSCOPY WITH PROPOFOL N/A 01/30/2017   Procedure: COLONOSCOPY WITH PROPOFOL;  Surgeon: Christene Lye, MD;  Location:  [facility] ENDOSCOPY;  Service: Endoscopy;  Laterality: N/A;     FAMILY HISTORY   Family History  Problem Relation Age of Onset  . Diabetes Father   . Hypertension Father   . Parkinson's disease Father   . Hypertension Mother   . Rheum arthritis Mother   . Atrial fibrillation Mother   . Heart failure Mother   . Heart attack Mother        died in her mid-70's.  . Colon cancer Cousin 14  . Breast cancer Neg Hx      SOCIAL HISTORY   Social History   Tobacco Use  . Smoking status: Never Smoker  . Smokeless tobacco: Never Used  Vaping Use  . Vaping Use: Never used  Substance Use Topics  . Alcohol use: No    Alcohol/week: 0.0 standard drinks  . Drug use: No     MEDICATIONS    Home Medication:    Current Medication:  Current Facility-Administered Medications:  .  0.9 %  sodium chloride infusion, 250 mL, Intravenous, PRN, Karmen Bongo, MD, Stopped at 02/14/20 0054 .  apixaban (ELIQUIS) tablet 5 mg, 5 mg, Oral, BID, Karmen Bongo, MD, 5 mg at 02/16/20 0908 .  atorvastatin (LIPITOR) tablet 20 mg, 20 mg, Oral, QHS, Karmen Bongo, MD, 20 mg at 02/15/20 2120 .  cloNIDine (CATAPRES) tablet 0.2 mg, 0.2 mg, Oral, BID, Karmen Bongo, MD, 0.2 mg at 02/16/20 0907 .  dextromethorphan-guaiFENesin (MUCINEX DM) 30-600 MG per 12 hr tablet 1 tablet, 1 tablet, Oral, BID, Ezekiel Slocumb, DO, 1 tablet at 02/16/20 0911 .  folic acid (FOLVITE) tablet 1 mg, 1,000 mcg, Oral, Daily, Karmen Bongo, MD, 1 mg at 02/16/20 0907 .  furosemide (LASIX) injection 40 mg, 40 mg, Intravenous, BID, Nicole Kindred A, DO, 40 mg at 02/16/20 1607 .  hydrALAZINE (APRESOLINE) tablet 25 mg, 25 mg, Oral, Q6H PRN, Nicole Kindred A, DO, 25 mg at 02/13/20 0511 .  insulin aspart (novoLOG) injection 0-15 Units, 0-15 Units, Subcutaneous, TID WC, Karmen Bongo, MD, 2 Units at 02/16/20 1607 .  ipratropium-albuterol (DUONEB) 0.5-2.5 (3) MG/3ML nebulizer solution 3 mL, 3 mL, Nebulization, BID, Enzo Bi, MD, 3 mL  at 02/16/20 1037 .  levothyroxine (SYNTHROID) tablet 112 mcg, 112 mcg, Oral, QAC breakfast, Karmen Bongo, MD, 112 mcg at 02/16/20 [phone] .  melatonin tablet 10 mg, 10 mg, Oral, QHS PRN, Karmen Bongo, MD, 10 mg at 02/16/20 0030 .  metoprolol tartrate (LOPRESSOR) tablet 50 mg, 50 mg, Oral, BID, Karmen Bongo, MD, 50 mg at 02/16/20 0908 .  nystatin (MYCOSTATIN) 100000 UNIT/ML suspension 500,000 Units, 5 mL, Oral, QID, Karmen Bongo, MD, 500,000 Units at 02/16/20 1607 .  sodium chloride flush (NS) 0.9 % injection 3 mL, 3 mL, Intravenous, Q12H, Karmen Bongo, MD, 3 mL at 02/16/20 0912 .  sodium chloride flush (NS) 0.9 % injection 3 mL, 3 mL, Intravenous, PRN, Karmen Bongo, MD    ALLERGIES   Nicole Schmidt has no  known allergies.     REVIEW OF SYSTEMS    Review of Systems:  Gen:  Denies  fever, sweats, chills weigh loss  HEENT: Denies blurred vision, double vision, ear pain, eye pain, hearing loss, nose bleeds, sore throat Cardiac:  No dizziness, chest pain or heaviness, chest tightness,edema Resp:   Denies cough or sputum porduction, shortness of breath,wheezing, hemoptysis,  Gi: Denies swallowing difficulty, stomach pain, nausea or vomiting, diarrhea, constipation, bowel incontinence Gu:  Denies bladder incontinence, burning urine Ext:   Denies Joint pain, stiffness or swelling Skin: Denies  skin rash, easy bruising or bleeding or hives Endoc:  Denies polyuria, polydipsia , polyphagia or weight change Psych:   Denies depression, insomnia or hallucinations   Other:  All other systems negative   VS: BP (!) 160/62   Pulse 66   Temp (!) 97.5 F (36.4 C) (Oral)   Resp 17   Ht 5\' 4"  (1.626 m)   Wt (!) 167.5 kg   LMP 07/17/2012   SpO2 97%   BMI 63.39 kg/m      PHYSICAL EXAM    GENERAL:NAD, no fevers, chills, no weakness no fatigue HEAD: Normocephalic, atraumatic.  EYES: Pupils equal, round, reactive to light. Extraocular muscles intact. No scleral icterus.  MOUTH:  Moist mucosal membrane. Dentition intact. No abscess noted.  EAR, NOSE, THROAT: Clear without exudates. No external lesions.  NECK: Supple. No thyromegaly. No nodules. No JVD.  PULMONARY: decreased air entry bilaterally improved  CARDIOVASCULAR: S1 and S2. Regular rate and rhythm. No murmurs, rubs, or gallops. No edema. Pedal pulses 2+ bilaterally.  GASTROINTESTINAL: Soft, nontender, nondistended. No masses. Positive bowel sounds. No hepatosplenomegaly.  MUSCULOSKELETAL: No swelling, clubbing, or edema. Range of motion full in all extremities.  NEUROLOGIC: Cranial nerves II through XII are intact. No gross focal neurological deficits. Sensation intact. Reflexes intact.  SKIN: No ulceration, lesions, rashes, or cyanosis. Skin warm and dry. Turgor intact.  PSYCHIATRIC: Mood, affect within normal limits. The Nicole Schmidt is awake, alert and oriented x 3. Insight, judgment intact.       IMAGING    DG Chest 2 View  Result Date: 02/09/2020 CLINICAL DATA:  Cough and congestion. EXAM: CHEST - 2 VIEW COMPARISON:  October 20, 2018 FINDINGS: Mild, diffuse chronic appearing increased interstitial lung markings are seen. Mild, stable linear scarring and/or atelectasis is noted within the left lung base. There is no evidence of a pleural effusion or pneumothorax. The heart size and mediastinal contours are within normal limits. There is moderate severity calcification of the aortic arch. The visualized skeletal structures are unremarkable. IMPRESSION: Chronic appearing increased interstitial lung markings with stable left basilar linear scarring and/or atelectasis. Electronically Signed   By: Virgina Norfolk M.D.   On: 02/09/2020 02:30   CT Chest Wo Contrast  Result Date: 02/09/2020 CLINICAL DATA:  55 year old female with cough and congestion. EXAM: CT CHEST WITHOUT CONTRAST TECHNIQUE: Multidetector CT imaging of the chest was performed following the standard protocol without IV contrast. COMPARISON:  Chest  radiographs earlier today.  Chest CTA 10/18/2014. FINDINGS: Cardiovascular: Calcified aortic atherosclerosis. Calcified coronary artery atherosclerosis. Vascular patency is not evaluated in the absence of IV contrast. No cardiomegaly or pericardial effusion. Mediastinum/Nodes: Negative.  No lymphadenopathy. Lungs/Pleura: Bilateral PE demonstrated in 2016. Confluent linear opacity in the left lower lobe could be atelectasis or chronic pulmonary scarring. Major airways remain patent. There is widely scattered bilateral patchy and indistinct pulmonary nodular opacity (series 3, image 76 bilaterally) with additional superimposed pulmonary ground-glass opacity or  mosaic attenuation. No pleural effusion. Upper Abdomen: Questionable hepatomegaly but otherwise negative visible noncontrast liver, spleen, pancreas, right adrenal gland. Relatively low-density 23 mm left adrenal nodule, was not included on the 2016 CTA. Negative visible upper abdominal bowel. Musculoskeletal: Widespread thoracic spine ankylosis from flowing endplate osteophytes or syndesmophytes. Chronic right posterior 10th rib fracture with cortical thickening is new since 2016. No acute osseous abnormality identified. IMPRESSION: 1. Widely scattered patchy and indistinct nodular pulmonary opacity compatible with acute viral/atypical respiratory infection. No pleural effusion. Superimposed bilateral mosaic attenuation which can be related to small airway or small vessel disease (and history of 2016 PE). 2. A 2.3 cm left adrenal nodule is indeterminate on noncontrast CT but probably benign. Recommend follow-up Adrenal protocol Abdomen CT (without and with contrast) in 1 year. This recommendation follows ACR consensus guidelines: Management of Incidental Adrenal Masses: A White Paper of the ACR Incidental Findings Committee. J Am Coll Radiol 2017;14:1038-1044. 3. Aortic Atherosclerosis (ICD10-I70.0). Calcified coronary artery atherosclerosis. Electronically  Signed   By: Genevie Ann M.D.   On: 02/09/2020 06:22   DG Chest Port 1 View  Result Date: 02/15/2020 CLINICAL DATA:  Respiratory failure EXAM: PORTABLE CHEST 1 VIEW COMPARISON:  02/09/2020 FINDINGS: Cardiomegaly. Interstitial prominence and patchy bilateral airspace opacities, slightly worsened since prior study. No effusions or pneumothorax. IMPRESSION: Worsening interstitial prominence and patchy bilateral airspace disease. Electronically Signed   By: Rolm Baptise M.D.   On: 02/15/2020 14:22   ECHOCARDIOGRAM COMPLETE  Result Date: 02/09/2020    ECHOCARDIOGRAM REPORT   Nicole Schmidt Name:   GWENETTA DEVOS Date of Exam: 02/09/2020 Medical Rec #:  [id number]      Height:       64.0 in Accession #:    [id number]     Weight:       360.0 lb Date of Birth:  1965/11/13       BSA:          2.512 m Nicole Schmidt Age:    59 years       BP:           157/61 mmHg Nicole Schmidt Gender: F              HR:           65 bpm. Exam Location:  [facility] Procedure: 2D Echo, Cardiac Doppler and Color Doppler Indications:     CHF- acute systolic 818.56  History:         Nicole Schmidt has prior history of Echocardiogram examinations, most                  recent 10/20/2018. Risk Factors:Hypertension. Pulmonary embolus.  Sonographer:     Sherrie Sport RDCS (AE) Referring Phys:  [id number] Karmen Bongo Diagnosing Phys: Harrell Gave End MD  Sonographer Comments: Technically difficult study due to poor echo windows, suboptimal apical window and suboptimal subcostal window. IMPRESSIONS  1. Left ventricular ejection fraction, by estimation, is 65 to 70%. The left ventricle has normal function. Left ventricular endocardial border not optimally defined to evaluate regional wall motion. There is severe left ventricular hypertrophy. Left ventricular diastolic parameters are consistent with Grade II diastolic dysfunction (pseudonormalization). Elevated left atrial pressure.  2. Right ventricular systolic function is normal. The right ventricular size is normal.  3. Left atrial size  was moderately dilated.  4. Right atrial size was mildly dilated.  5. The mitral valve is grossly normal. No evidence of mitral valve regurgitation.  6. The aortic valve was not well visualized. Aortic valve  regurgitation is not visualized. No aortic stenosis is present. FINDINGS  Left Ventricle: Left ventricular ejection fraction, by estimation, is 65 to 70%. The left ventricle has normal function. Left ventricular endocardial border not optimally defined to evaluate regional wall motion. The left ventricular internal cavity size was normal in size. There is severe left ventricular hypertrophy. Left ventricular diastolic parameters are consistent with Grade II diastolic dysfunction (pseudonormalization). Elevated left atrial pressure. Right Ventricle: The right ventricular size is normal. No increase in right ventricular wall thickness. Right ventricular systolic function is normal. Left Atrium: Left atrial size was moderately dilated. Right Atrium: Right atrial size was mildly dilated. Pericardium: The pericardium was not well visualized. Mitral Valve: The mitral valve is grossly normal. No evidence of mitral valve regurgitation. Tricuspid Valve: The tricuspid valve is not well visualized. Tricuspid valve regurgitation is trivial. Aortic Valve: The aortic valve was not well visualized. Aortic valve regurgitation is not visualized. No aortic stenosis is present. Aortic valve mean gradient measures 9.0 mmHg. Aortic valve peak gradient measures 15.8 mmHg. Aortic valve area, by VTI measures 2.62 cm. Pulmonic Valve: The pulmonic valve was not well visualized. Pulmonic valve regurgitation is not visualized. No evidence of pulmonic stenosis. Aorta: The aortic root and ascending aorta are structurally normal, with no evidence of dilitation. Pulmonary Artery: The pulmonary artery is not well seen. IAS/Shunts: The interatrial septum was not well visualized.  LEFT VENTRICLE PLAX 2D LVIDd:         4.42 cm  Diastology LVIDs:          2.42 cm  LV e' medial:    5.77 cm/s LV PW:         1.71 cm  LV E/e' medial:  20.1 LV IVS:        1.60 cm  LV e' lateral:   11.10 cm/s LVOT diam:     2.10 cm  LV E/e' lateral: 10.5 LV SV:         107 LV SV Index:   43 LVOT Area:     3.46 cm  RIGHT VENTRICLE RV Basal diam:  3.42 cm RV S prime:     13.70 cm/s TAPSE (M-mode): 4.4 cm LEFT ATRIUM            Index       RIGHT ATRIUM           Index LA diam:      4.20 cm  1.67 cm/m  RA Area:     18.00 cm LA Vol (A2C): 71.6 ml  28.51 ml/m RA Volume:   40.40 ml  16.09 ml/m LA Vol (A4C): 107.0 ml 42.60 ml/m  AORTIC VALVE                    PULMONIC VALVE AV Area (Vmax):    2.33 cm     PV Vmax:        0.92 m/s AV Area (Vmean):   2.32 cm     PV Peak grad:   3.4 mmHg AV Area (VTI):     2.62 cm     RVOT Peak grad: 4 mmHg AV Vmax:           199.00 cm/s AV Vmean:          140.000 cm/s AV VTI:            0.409 m AV Peak Grad:      15.8 mmHg AV Mean Grad:      9.0 mmHg LVOT Vmax:  134.00 cm/s LVOT Vmean:        93.800 cm/s LVOT VTI:          0.309 m LVOT/AV VTI ratio: 0.76  AORTA Ao Root diam: 2.97 cm MITRAL VALVE                TRICUSPID VALVE MV Area (PHT): 3.20 cm     TR Peak grad:   16.2 mmHg MV Decel Time: 237 msec     TR Vmax:        201.00 cm/s MV E velocity: 116.00 cm/s MV A velocity: 79.10 cm/s   SHUNTS MV E/A ratio:  1.47         Systemic VTI:  0.31 m                             Systemic Diam: 2.10 cm Nelva Bush MD Electronically signed by Nelva Bush MD Signature Date/Time: 02/09/2020/1:01:59 PM    Final       ASSESSMENT/PLAN   Acute on chronic hypoxemic respiratory failure -Nicole Schmidt is net 8L negative -Nicole Schmidt has pulmonary edema on above CT chest , will order CXR for interval changes - Nicole Schmidt has bilateral 2+pitting LE edema and stasis dermatitis - will add aldactone to regimen -Bibasilar atelectasis - Nicole Schmidt able to take tidal volume of 750 during examination  -weaned to 1.5L    Bibasilar atelectasis  - encourage Nicole Schmidt  to use IS - currently only at 750 cc tidal volume  - initiate metaNEB - Twice daily   - PT and OT prior to d/c    OSA on cpap  - Nicole Schmidt may use our device - she shares while using CPAP here she had high amount of air leakage from full face mask inteface which caused sleep disturbances.    Thank you for allowing me to participate in the care of this Nicole Schmidt.  Nicole Schmidt/Family are satisfied with care plan and all questions have been answered.  This document was prepared using Dragon voice recognition software and may include unintentional dictation errors.     Ottie Glazier, M.D.  Division of [place]

## 2020-02-17 DIAGNOSIS — J9601 Acute respiratory failure with hypoxia: Secondary | ICD-10-CM | POA: Diagnosis not present

## 2020-02-17 DIAGNOSIS — I5033 Acute on chronic diastolic (congestive) heart failure: Secondary | ICD-10-CM | POA: Diagnosis not present

## 2020-02-17 LAB — GLUCOSE, CAPILLARY
Glucose-Capillary: 104 mg/dL — ABNORMAL HIGH (ref 70–99)
Glucose-Capillary: 177 mg/dL — ABNORMAL HIGH (ref 70–99)
Glucose-Capillary: 114 mg/dL — ABNORMAL HIGH (ref 70–99)

## 2020-02-17 LAB — BASIC METABOLIC PANEL
Anion gap: 9 (ref 5–15)
BUN: 58 mg/dL — ABNORMAL HIGH (ref 6–20)
CO2: 31 mmol/L (ref 22–32)
Calcium: 9 mg/dL (ref 8.9–10.3)
Chloride: 104 mmol/L (ref 98–111)
Creatinine, Ser: 1.7 mg/dL — ABNORMAL HIGH (ref 0.44–1.00)
GFR, Estimated: 35 mL/min — ABNORMAL LOW (ref >60)
Glucose, Bld: 122 mg/dL — ABNORMAL HIGH (ref 70–99)
Potassium: 4.8 mmol/L (ref 3.5–5.1)
Sodium: 144 mmol/L (ref 135–145)

## 2020-02-17 LAB — CBC
HCT: 36 % (ref 36.0–46.0)
Hemoglobin: 10.6 g/dL — ABNORMAL LOW (ref 12.0–15.0)
MCH: 27.5 pg (ref 26.0–34.0)
MCHC: 29.4 g/dL — ABNORMAL LOW (ref 30.0–36.0)
MCV: 93.3 fL (ref 80.0–100.0)
Platelets: 242 10*3/uL (ref 150–400)
RBC: 3.86 MIL/uL — ABNORMAL LOW (ref 3.87–5.11)
RDW: 14 % (ref 11.5–15.5)
WBC: 12.6 10*3/uL — ABNORMAL HIGH (ref 4.0–10.5)
nRBC: 0 % (ref 0.0–0.2)

## 2020-02-17 LAB — MAGNESIUM: Magnesium: 2.3 mg/dL (ref 1.7–2.4)

## 2020-02-17 MED ORDER — COVID-19 MRNA VACCINE (PFIZER) 30 MCG/0.3ML IM SUSP
0.3000 mL | Freq: Once | INTRAMUSCULAR | Status: DC
  Filled 2020-02-17: qty 0.3

## 2020-02-17 NOTE — Progress Notes (Signed)
Mobility Specialist - Progress Note   02/17/20 1601  Mobility  Activity Ambulated in room;Ambulated in hall;Ambulated to bathroom  Level of Assistance Independent (needs assist only w/ managing portable O2 tank)  Assistive Device Front wheel walker  Distance Ambulated (ft) 250 ft  Mobility Response Tolerated well  Mobility performed by Mobility specialist  $Mobility charge 1 Mobility    Pre-mobility: 73 HR, 92% SpO2 During mobility: 89 HR, 77% SpO2 Post-mobility: 78 HR, 89% SpO2   Pt sitting on recliner upon arrival. Pt agreed to session. Pt independent t/o session, only needing assist w/ handling portable O2 tank. Pt ambulated 250' total. After ambulating 80', noted pt SOB and O2 desat to 77%. Increased O2 to 2L O2 [place]. O2 sat up to 89% after 5 mins standing rest break. O2 sat 87-89% for the remaining distance of ambulation. Overall, pt tolerated session well. Pt left sitting on recliner w/ 2L O2 [place] applied. O2 sat up to 95% after 5 mins of resting. All needs placed in reach.      Calli Bashor Mobility Specialist  02/17/20, 4:09 PM

## 2020-02-17 NOTE — Progress Notes (Signed)
OT Cancellation Note  Patient Details Name: Nicole Schmidt MRN: [id number] DOB: October 28, 1965   Cancelled Treatment:    Reason Eval/Treat Not Completed: OT screened, no needs identified, will sign off. Order received, chart reviewed. Per conversation c PT, pt near baseline mobility and ADL management. Pt denies need for skilled OT. Will sign off. Please re-consult if additional needs arise.   Dessie Coma, M.S. OTR/L  02/17/20, 9:30 AM  ascom [phone]

## 2020-02-17 NOTE — Progress Notes (Addendum)
PROGRESS NOTE    DYNA FIGUEREO   UUV:[id number]  DOB: 30-Jul-1965  PCP: Lenard Simmer, MD    DOA: 02/09/2020 LOS: 8   Brief Narrative   Per H&P by Dr. Lorin Mercy: "Nicole Schmidt is a 55 y.o. female with medical history significant of PE (2016); afib on Eliquis; morbid obesity (BMI 61.79); hypothyroidism; HTN; DM; OSA on CPAP (autopap); stage 3 CKD; chronic diastolic CHF; and remote breast cancer presenting with SOB.  Friday night, she had a dry cough and was around family.  She sneezed a lot and then felt congested and so stayed home after.  She could tell that she wasn't able to take a deep breath.  She has chronic DOE but usually recovers quickly.  Since Friday, she has been more SOB.  +LE edema, possibly mildly worse than usual.  She sleeps in a recliner at baseline.  She has gained weight, maybe 20 pounds this year from lack of mobility."   In the ED, O2 sat 87 on room air.  Covid and influenza negative.  BNP mildly elevated 197.9.  Leukocytosis 15.4k CT chest without contrast (due to renal function) showed Widely scattered patchy and indistinct nodular pulmonary opacity compatible with acute viral/atypical respiratory infection. No pleural effusion. Superimposed bilateral mosaic attenuation which can be related to small airway or small vessel disease (and history of 2016 PE).  Admitted for further evaluation and management of suspected CHF exacerbation, and likely atypical pneumonia.  Initially diuresed with IV Lasix, since stopped due to worsened renal function.  Started on empiric antibiotics for PNA vs bronchitis.  Echo showed EG 65-70%, severe LVH, grade II diastolic dysfunction, moderate LA dilation, mild RA dilation.    Assessment & Plan   Principal Problem:   Acute hypoxemic respiratory failure (HCC) Active Problems:   History of breast cancer   Chronic renal disease, stage III (HCC)   Essential hypertension   Chronic diastolic heart failure (HCC)   OSA on CPAP   Morbid  obesity (HCC)   Atrial fibrillation, chronic (HCC)   Dyslipidemia   Diabetes mellitus type 2 in obese ([place])   Acquired hypothyroidism   Thrush, oral   Acute respiratory failure with hypoxia due to ?Pneumonia/bronchitis and acute on chronic diastolic CHF  - primary symptom of dyspnea on exertion above baseline, slightly worse edema, chest congestion.  Has hx of PE on Eliquis, so less likely PE.  --documented O2 sat of 87% on presentation. --CT chest showed "Widely scattered patchy and indistinct nodular pulmonary opacity compatible with acute viral/atypical respiratory infection. No pleural effusion."  COVID neg, RVP neg. --BNP 197, which is similar to prior.  Not showing signs of fluid overload, more PNA. --Echo on 12/27 with preserved EF, grade 2 diastolic dysfunction and severe LVH --started on empiric abx on admission, completed 5 days of ceftriaxone. --Continue to have need for supplemental oxygen after completing antibiotics, continued lower extremity edema over her baseline as well, resumed on IV diuretic 1/2 --1/4-5 - still desaturates with ambulation on room air Net IO Since Admission: -14,176.69 mL [02/17/20 1607] Plan: --Consulted pulmonary --Continue IV Lasix 40 mg twice daily until objective evidence pt is "dry"  --Strict I/O's Daily weights --Monitor renal function and electrolytes closely --cont scheduled DuoNeb --Continue supplemental O2 to keep sats >=90%, wean as tolerated --IS, MetaNebs   Hyperkalemia - had been started on Aldactone per pulmonary, but K5.2 so stopped it for now.   Likely also result of renal failure.  BMP in the morning.  Hypertension  - on home amlodipine, clonidine, metop and losartan --cont home regimen except for losartan  --PRN hydralazine.  Hyperlipidemia  --Lipid panel with TG's 152 otherwise normal. --cont home statin  Type 2 Diabetes  - last A1c 7.1% in 10/2018, improved with current A1c of 6.4%.   --Hold Dulaglutide.  --cont  SSI  Hx of PE on Eliquis  --cont home Eliquis  Hx of A-fib on Eliquis - rate controlled.   --cont home metop and eliquis  Hypothyroidism - continue Synthroid  Thrush - Mycostatin   Stage 3b CKD - stable on admission.  Slight bump in Cr with IV diuretic.   --Hold diuresis for now --Hold home losartan for now  OSA  - nursing noted pt refusing nightly CPAP/autopap  Hx of Breast cancer - in remission, last mammogram on 11/21/19.  Has ongoing yearly surveillance.  Morbid obesity - Body mass index is 63.39 kg/m. Complicates overall care and prognosis.  Recommend lifestyle modifications, consider bariatric referral.  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  Likely benign based on appearance.   Recommendations is follow up CT scan with and without contrast in 1 year.   DVT prophylaxis:  apixaban (ELIQUIS) tablet 5 mg   Diet:  Diet Orders (From admission, onward)    Start     Ordered   02/09/20 1005  Diet Carb Modified Fluid consistency: Thin; Room service appropriate? Yes  Diet effective now       Question Answer Comment  Diet-HS Snack? Nothing   Calorie Level Medium 1600-2000   Fluid consistency: Thin   Room service appropriate? Yes      02/09/20 1006            Code Status: Full Code    Subjective 02/17/20    Patient up in chair when seen today.  She reports breathing is getting better with diuresis.  Still worse DOE than her baseline however.  No fever chills, chest pain or SOB at rest.   Disposition Plan & Communication   DVT prophylaxis: ZO:XWRUEAV Code Status: Full code  Family Communication:  Status is: inpatient Dispo:   The patient is from: home Anticipated d/c is to: home Anticipated d/c date is: 1-2 days  Patient currently is not medically stable to d/c due to: still on supplemental oxygen and undergoing IV diuresis as above.   Consults, Procedures, Significant Events   Consultants:   None  Procedures:   Echo EF  65-70%, grade II diastolic dysfunction, severe LVH  Antimicrobials:  Anti-infectives (From admission, onward)   Start     Dose/Rate Route Frequency Ordered Stop   02/11/20 2000  cefTRIAXone (ROCEPHIN) 2 g in sodium chloride 0.9 % 100 mL IVPB  Status:  Discontinued        2 g 200 mL/hr over 30 Minutes Intravenous Every 24 hours 02/11/20 1343 02/15/20 0712   02/10/20 1900  azithromycin (ZITHROMAX) 500 mg in sodium chloride 0.9 % 250 mL IVPB        500 mg 250 mL/hr over 60 Minutes Intravenous Every 24 hours 02/10/20 1804 02/12/20 1829   02/10/20 1900  cefTRIAXone (ROCEPHIN) 1 g in sodium chloride 0.9 % 100 mL IVPB  Status:  Discontinued        1 g 200 mL/hr over 30 Minutes Intravenous Every 24 hours 02/10/20 1804 02/11/20 1343   02/09/20 0630  azithromycin (ZITHROMAX) 500 mg in sodium chloride 0.9 % 250 mL IVPB        500 mg  250 mL/hr over 60 Minutes Intravenous  Once 02/09/20 0628 02/09/20 0950        Objective   Vitals:   02/17/20 0418 02/17/20 0640 02/17/20 0837 02/17/20 1140  BP: (!) 193/113 (!) 159/79 (!) 161/76 (!) 148/74  Pulse: 63 73 70 64  Resp: 16  18 16   Temp: 98.2 F (36.8 C)  98.5 F (36.9 C) 98.6 F (37 C)  TempSrc: Oral  Oral Oral  SpO2: 95%  96% 94%  Weight:      Height:        Intake/Output Summary (Last 24 hours) at 02/17/2020 1602 Last data filed at 02/17/2020 1310 Gross per 24 hour  Intake 960 ml  Output 2800 ml  Net -1840 ml   Filed Weights   02/11/20 0500 02/12/20 0108 02/14/20 0500  Weight: (!) 165.1 kg (!) 169.5 kg (!) 167.5 kg    Physical Exam:  Constitutional: Awake and alert, no acute distress, obese CV: RRR, no murmurs heard, 1 to 2+ lower extremity edema (improving, less tense) RESP: normal respiratory effort, on 1L/min nasal cannula oxygen Extremities: improving lower extremity pitting edema, mild venous stasis hyperpigmentation of distal lower extremities stable with no differential warmth on palpation, moves all extremities Neuro:  Normal speech, grossly nonfocal exam  Psych: Normal mood and affect.  Appropriate judgement and reason    Labs   Data Reviewed: I have personally reviewed following labs and imaging studies  CBC: Recent Labs  Lab 02/11/20 0522 02/12/20 0529 02/13/20 0356 02/14/20 0507 02/15/20 0533 02/16/20 0541 02/17/20 0401  WBC 12.5*   < > 12.6* 11.6* 11.7* 13.3* 12.6*  NEUTROABS 8.5*  --   --   --   --   --   --   HGB 10.6*   < > 10.3* 9.8* 10.0* 10.8* 10.6*  HCT 35.9*   < > 34.4* 33.7* 34.2* 36.3 36.0  MCV 95.0   < > 93.7 93.9 94.2 93.3 93.3  PLT 209   < > 218 220 207 237 242   < > = values in this interval not displayed.   Basic Metabolic Panel: Recent Labs  Lab 02/13/20 0356 02/14/20 0507 02/15/20 0533 02/16/20 0541 02/17/20 0401  NA 145 143 144 144 144  K 4.9 4.8 5.0 5.2* 4.8  CL 106 105 106 107 104  CO2 31 30 29 30 31   GLUCOSE 116* 111* 123* 117* 122*  BUN 56* 52* 48* 51* 58*  CREATININE 1.94* 1.75* 1.66* 1.72* 1.70*  CALCIUM 8.5* 8.7* 8.9 9.1 9.0  MG 2.3 2.4 2.3 2.3 2.3   GFR: Estimated Creatinine Clearance: 58.2 mL/min (A) (by C-G formula based on SCr of 1.7 mg/dL (H)). Liver Function Tests: No results for input(s): AST, ALT, ALKPHOS, BILITOT, PROT, ALBUMIN in the last 168 hours. No results for input(s): LIPASE, AMYLASE in the last 168 hours. No results for input(s): AMMONIA in the last 168 hours. Coagulation Profile: No results for input(s): INR, PROTIME in the last 168 hours. Cardiac Enzymes: No results for input(s): CKTOTAL, CKMB, CKMBINDEX, TROPONINI in the last 168 hours. BNP (last 3 results) No results for input(s): PROBNP in the last 8760 hours. HbA1C: No results for input(s): HGBA1C in the last 72 hours. CBG: Recent Labs  Lab 02/16/20 1132 02/16/20 1559 02/16/20 2132 02/17/20 0810 02/17/20 1137  GLUCAP 176* 136* 124* 104* 177*   Lipid Profile: No results for input(s): CHOL, HDL, LDLCALC, TRIG, CHOLHDL, LDLDIRECT in the last 72 hours. Thyroid  Function Tests: No results for input(s):  TSH, T4TOTAL, FREET4, T3FREE, THYROIDAB in the last 72 hours. Anemia Panel: No results for input(s): VITAMINB12, FOLATE, FERRITIN, TIBC, IRON, RETICCTPCT in the last 72 hours. Sepsis Labs: No results for input(s): PROCALCITON, LATICACIDVEN in the last 168 hours.  Recent Results (from the past 240 hour(s))  Resp Panel by RT-PCR (Flu A&B, Covid) Nasopharyngeal Swab     Status: None   Collection Time: 02/09/20  2:00 AM   Specimen: Nasopharyngeal Swab; Nasopharyngeal(NP) swabs in vial transport medium  Result Value Ref Range Status   SARS Coronavirus 2 by RT PCR NEGATIVE NEGATIVE Final    Comment: (NOTE) SARS-CoV-2 target nucleic acids are NOT DETECTED.  The SARS-CoV-2 RNA is generally detectable in upper respiratory specimens during the acute phase of infection. The lowest concentration of SARS-CoV-2 viral copies this assay can detect is 138 copies/mL. A negative result does not preclude SARS-Cov-2 infection and should not be used as the sole basis for treatment or other patient management decisions. A negative result may occur with  improper specimen collection/handling, submission of specimen other than nasopharyngeal swab, presence of viral mutation(s) within the areas targeted by this assay, and inadequate number of viral copies(<138 copies/mL). A negative result must be combined with clinical observations, patient history, and epidemiological information. The expected result is Negative.  Fact Sheet for Patients:  EntrepreneurPulse.com.au  Fact Sheet for Healthcare Providers:  IncredibleEmployment.be  This test is no t yet approved or cleared by the [place] FDA and  has been authorized for detection and/or diagnosis of SARS-CoV-2 by FDA under an Emergency Use Authorization (EUA). This EUA will remain  in effect (meaning this test can be used) for the duration of the COVID-19 declaration under  Section 564(b)(1) of the Act, 21 U.S.C.section 360bbb-3(b)(1), unless the authorization is terminated  or revoked sooner.       Influenza A by PCR NEGATIVE NEGATIVE Final   Influenza B by PCR NEGATIVE NEGATIVE Final    Comment: (NOTE) The Xpert Xpress SARS-CoV-2/FLU/RSV plus assay is intended as an aid in the diagnosis of influenza from Nasopharyngeal swab specimens and should not be used as a sole basis for treatment. Nasal washings and aspirates are unacceptable for Xpert Xpress SARS-CoV-2/FLU/RSV testing.  Fact Sheet for Patients: EntrepreneurPulse.com.au  Fact Sheet for Healthcare Providers: IncredibleEmployment.be  This test is not yet approved or cleared by the [place] FDA and has been authorized for detection and/or diagnosis of SARS-CoV-2 by FDA under an Emergency Use Authorization (EUA). This EUA will remain in effect (meaning this test can be used) for the duration of the COVID-19 declaration under Section 564(b)(1) of the Act, 21 U.S.C. section 360bbb-3(b)(1), unless the authorization is terminated or revoked.  Performed at [facility], [place], [place] [address]   Respiratory (~20 pathogens) panel by PCR     Status: None   Collection Time: 02/11/20 12:20 PM   Specimen: Nasopharyngeal Swab; Respiratory  Result Value Ref Range Status   Adenovirus NOT DETECTED NOT DETECTED Final   Coronavirus 229E NOT DETECTED NOT DETECTED Final    Comment: (NOTE) The Coronavirus on the Respiratory Panel, DOES NOT test for the novel  Coronavirus (2019 nCoV)    Coronavirus HKU1 NOT DETECTED NOT DETECTED Final   Coronavirus NL63 NOT DETECTED NOT DETECTED Final   Coronavirus OC43 NOT DETECTED NOT DETECTED Final   Metapneumovirus NOT DETECTED NOT DETECTED Final   Rhinovirus / Enterovirus NOT DETECTED NOT DETECTED Final   Influenza A NOT DETECTED NOT DETECTED Final  Influenza B NOT DETECTED NOT DETECTED Final    Parainfluenza Virus 1 NOT DETECTED NOT DETECTED Final   Parainfluenza Virus 2 NOT DETECTED NOT DETECTED Final   Parainfluenza Virus 3 NOT DETECTED NOT DETECTED Final   Parainfluenza Virus 4 NOT DETECTED NOT DETECTED Final   Respiratory Syncytial Virus NOT DETECTED NOT DETECTED Final   Bordetella pertussis NOT DETECTED NOT DETECTED Final   Bordetella Parapertussis NOT DETECTED NOT DETECTED Final   Chlamydophila pneumoniae NOT DETECTED NOT DETECTED Final   Mycoplasma pneumoniae NOT DETECTED NOT DETECTED Final    Comment: Performed at [facility], [place] [address], [place], [place] [address]      Imaging Studies   No results found.   Medications   Scheduled Meds: . apixaban  5 mg Oral BID  . atorvastatin  20 mg Oral QHS  . cloNIDine  0.2 mg Oral BID  . dextromethorphan-guaiFENesin  1 tablet Oral BID  . folic acid  8,657 mcg Oral Daily  . furosemide  40 mg Intravenous BID  . insulin aspart  0-15 Units Subcutaneous TID WC  . ipratropium-albuterol  3 mL Nebulization BID  . levothyroxine  112 mcg Oral QAC breakfast  . metoprolol tartrate  50 mg Oral BID  . nystatin  5 mL Oral QID  . sodium chloride flush  3 mL Intravenous Q12H   Continuous Infusions: . sodium chloride Stopped (02/14/20 0054)       LOS: 8 days    Time spent: 25 minutes with greater than 50% spent at bedside and in coordination of care.   Ezekiel Slocumb, md [facility]  02/17/2020, 4:02 PM

## 2020-02-17 NOTE — Progress Notes (Signed)
Physical Therapy Treatment Patient Details Name: Nicole Schmidt MRN: [id number] DOB: 30-Apr-1965 Today's Date: 02/17/2020    History of Present Illness Pt is a 55 y.o. female with medical history significant of PE (2016); afib on Eliquis; morbid obesity (BMI 61.79); hypothyroidism; HTN; DM; OSA on CPAP; stage 3 CKD; chronic diastolic CHF; and remote breast cancer presenting with SOB.  MD assessment includes: CHF and acute respiratory failure with hypoxia.    PT Comments    Pt is making good progress towards goals with ability to ambulate in hallway safely without AD. Educated on energy conservation techniques and breathing exercises. Ambulation performed on/off O2. Encouraged and educated on continuing active lifestyle to improve functional independence. Will continue to progress as able.   SaO2 on room air at rest = 90% SaO2 on room air while ambulating = 78% SaO2 on 2 liters of O2 while ambulating = 92%    Follow Up Recommendations  Outpatient PT     Equipment Recommendations  None recommended by PT    Recommendations for Other Services       Precautions / Restrictions Precautions Precautions: Fall Restrictions Weight Bearing Restrictions: No    Mobility  Bed Mobility               General bed mobility comments: not performed as pt received in chair.  Transfers Overall transfer level: Independent Equipment used: None Transfers: Sit to/from Stand Sit to Stand: Independent         General transfer comment: safe technique with upright posture  Ambulation/Gait Ambulation/Gait assistance: Independent Gait Distance (Feet): 200 Feet Assistive device: None Gait Pattern/deviations: Step-through pattern     General Gait Details: Able to ambulate in hallway with 2 standing rest breaks for O2 sats check. Reciprocal gait pattern performed. No LOB noted   Stairs             Wheelchair Mobility    Modified Rankin (Stroke Patients Only)       Balance  Overall balance assessment: Independent                                          Cognition Arousal/Alertness: Awake/alert Behavior During Therapy: WFL for tasks assessed/performed Overall Cognitive Status: Within Functional Limits for tasks assessed                                 General Comments: Pt is A and O x 4 and very cooperative and pleasant      Exercises Other Exercises Other Exercises: ambulated to bathroom with mod I for O2 line management. Safe technique, with no balance loss    General Comments        Pertinent Vitals/Pain Pain Assessment: No/denies pain    Home Living                      Prior Function            PT Goals (current goals can now be found in the care plan section) Acute Rehab PT Goals Patient Stated Goal: home PT Goal Formulation: With patient Time For Goal Achievement: 02/22/20 Potential to Achieve Goals: Good Progress towards PT goals: Progressing toward goals    Frequency    Min 2X/week      PT Plan Current plan remains appropriate  Co-evaluation              AM-PAC PT "6 Clicks" Mobility   Outcome Measure  Help needed turning from your back to your side while in a flat bed without using bedrails?: None Help needed moving from lying on your back to sitting on the side of a flat bed without using bedrails?: None Help needed moving to and from a bed to a chair (including a wheelchair)?: None Help needed standing up from a chair using your arms (e.g., wheelchair or bedside chair)?: None Help needed to walk in hospital room?: None Help needed climbing 3-5 steps with a railing? : None 6 Click Score: 24    End of Session Equipment Utilized During Treatment: Gait belt;Oxygen Activity Tolerance: Patient tolerated treatment well Patient left: in chair Nurse Communication: Mobility status PT Visit Diagnosis: Muscle weakness (generalized) (M62.81);Difficulty in walking, not  elsewhere classified (R26.2)     Time: 4103-0131 PT Time Calculation (min) (ACUTE ONLY): 23 min  Charges:  $Gait Training: 8-22 mins $Therapeutic Activity: 8-22 mins                     Nicole Schmidt, PT, DPT [phone]    Nicole Schmidt 02/17/2020, 1:32 PM

## 2020-02-17 NOTE — Progress Notes (Signed)
Pulmonary Medicine          Date: 02/17/2020,   MRN# [id number] Nicole Schmidt 1965/05/07     AdmissionWeight: (!) 163.3 kg                 CurrentWeight: (!) 167.5 kg  Referring physician: Dr Arbutus Ped    CHIEF COMPLAINT:   Persistent respiratory distress with increased O2 requirement   HISTORY OF PRESENT ILLNESS   Nicole Pokorny Gibsonis a 55 y.o.femalewith medical history significant ofPE(2016); afib on Eliquis; morbid obesity (BMI 61.79); hypothyroidism; HTN; DM;OSA on CPAP (autopap);stage 3 CKD; chronic diastolic CHF; and remote breast cancer presenting with SOB. Friday night, she had a dry cough and was around family. She sneezed a lot and then felt congested and so stayed home after. She could tell that she wasn't able to take a deep breath. She has chronic DOE but usually recovers quickly. Since Friday, she has been more SOB. +LE edema, possibly mildly worse than usual. She sleeps in a recliner at baseline. She has gained weight, maybe 20 pounds this year from lack of mobility."In the ED, O2 sat 87 on room air.  Covid and influenza negative.  BNP mildly elevated 197.9.  Leukocytosis 15.4k CT chest without contrast (due to renal function) showed Widely scattered patchy and indistinct nodular pulmonary opacity compatible with acute viral/atypical respiratory infection. No pleural effusion. Superimposed bilateral mosaic attenuation which can be related to small airway or small vessel disease (and history of 2016 PE).Admitted for further evaluation and management of suspected CHF exacerbation, and likely atypical pneumonia.  Initially diuresed with IV Lasix, since stopped due to worsened renal function.  Started on empiric antibiotics for PNA vs bronchitis.Echo showed EG 65-70%, severe LVH, grade II diastolic dysfunction, moderate LA dilation, mild RA dilation. She was previosly on O2 only in 2016 transiently while with acute PE.    02/16/19- patient is improved overnight.   She diuresed well with aldactone and lasix and now transitioned to bid lasix with net 3600 UOP overnight and total >12L negative.  She was able to be weaned to 1.5L/min [place].  She works with RT using [place] and found this to be challenging but effective.  Plan to continue current care plan and PT/OT.    02/17/19- patient is further improved. She had vigorous urine output and has been able to take deeper breaths. She was able to walk today and did require 1-2L/min supplemental O2.  She thinks now she is near her baseline. Plan to repeat CXR in am and continue current regimen. LE edema is 2+ improved from 3+. She is scheduled to get booster for covid tommorow.   PAST MEDICAL HISTORY   Past Medical History:  Diagnosis Date  . (HFpEF) heart failure with preserved ejection fraction ([place])    a. 10/2018 Echo: EF 60-65%, diast dysfxn, RVSP 50.7 mmHg, mildly dil LA.  Marland Kitchen Acquired hypothyroidism 02/09/2020  . Anemia   . Arthritis   . Breast cancer ([place]) 2014   right breast cancer  . Breast cancer of upper-inner quadrant of right female breast ([name])    Right breast invasive CA and DCIS , 7 mm T1,N0,M0. Er/PR pos, her 2 negative.  Margins 1 mm.  . CHF (congestive heart failure) ([place])   . CKD (chronic kidney disease), stage III ([place])   . Diabetes mellitus type 2 in obese ([place]) 02/09/2020  . Diabetes mellitus without complication ([place])   . Dyslipidemia 02/09/2020  . Essential hypertension   .  Gout   . Hypothyroidism   . Menopause    age 1  . Morbid obesity ([place])   . Persistent atrial fibrillation ([place])    a. Dx 10/2018 in setting of PNA. CHA2DS2VASc = 4-->Eliquis; b. 11/2018 s/p successful DCCV.  Marland Kitchen Personal history of radiation therapy   . Pulmonary embolism ([place]) 10/2014   a. Chronic eliquis.  . Thyroid goiter      SURGICAL HISTORY   Past Surgical History:  Procedure Laterality Date  . BREAST BIOPSY Right 2014   breast ca  . BREAST EXCISIONAL BIOPSY Right 07/19/2012   breast ca  . BREAST  SURGERY Right 2014   with sentinel node bx subareaolar duct excision  . CARDIOVERSION N/A 11/28/2018   Procedure: CARDIOVERSION;  Surgeon: Minna Merritts, MD;  Location: [facility] ORS;  Service: Cardiovascular;  Laterality: N/A;  . COLONOSCOPY WITH PROPOFOL N/A 01/30/2017   Procedure: COLONOSCOPY WITH PROPOFOL;  Surgeon: Christene Lye, MD;  Location: [facility] ENDOSCOPY;  Service: Endoscopy;  Laterality: N/A;     FAMILY HISTORY   Family History  Problem Relation Age of Onset  . Diabetes Father   . Hypertension Father   . Parkinson's disease Father   . Hypertension Mother   . Rheum arthritis Mother   . Atrial fibrillation Mother   . Heart failure Mother   . Heart attack Mother        died in her mid-70's.  . Colon cancer Cousin 26  . Breast cancer Neg Hx      SOCIAL HISTORY   Social History   Tobacco Use  . Smoking status: Never Smoker  . Smokeless tobacco: Never Used  Vaping Use  . Vaping Use: Never used  Substance Use Topics  . Alcohol use: No    Alcohol/week: 0.0 standard drinks  . Drug use: No     MEDICATIONS    Home Medication:    Current Medication:  Current Facility-Administered Medications:  .  0.9 %  sodium chloride infusion, 250 mL, Intravenous, PRN, Karmen Bongo, MD, Stopped at 02/14/20 0054 .  apixaban (ELIQUIS) tablet 5 mg, 5 mg, Oral, BID, Karmen Bongo, MD, 5 mg at 02/17/20 0831 .  atorvastatin (LIPITOR) tablet 20 mg, 20 mg, Oral, QHS, Karmen Bongo, MD, 20 mg at 02/16/20 2203 .  cloNIDine (CATAPRES) tablet 0.2 mg, 0.2 mg, Oral, BID, Karmen Bongo, MD, 0.2 mg at 02/17/20 0831 .  [START ON 02/18/2020] COVID-19 mRNA vaccine (Pfizer) injection 0.3 mL, 0.3 mL, Intramuscular, Once, Nicole Kindred A, DO .  dextromethorphan-guaiFENesin (MUCINEX DM) 30-600 MG per 12 hr tablet 1 tablet, 1 tablet, Oral, BID, Nicole Kindred A, DO, 1 tablet at 02/17/20 0831 .  folic acid (FOLVITE) tablet 1 mg, 1,000 mcg, Oral, Daily, Karmen Bongo, MD, 1 mg at  02/17/20 0831 .  furosemide (LASIX) injection 40 mg, 40 mg, Intravenous, BID, Nicole Kindred A, DO, 40 mg at 02/17/20 1633 .  hydrALAZINE (APRESOLINE) tablet 25 mg, 25 mg, Oral, Q6H PRN, Nicole Kindred A, DO, 25 mg at 02/13/20 0511 .  insulin aspart (novoLOG) injection 0-15 Units, 0-15 Units, Subcutaneous, TID WC, Karmen Bongo, MD, 3 Units at 02/17/20 1148 .  ipratropium-albuterol (DUONEB) 0.5-2.5 (3) MG/3ML nebulizer solution 3 mL, 3 mL, Nebulization, BID, Enzo Bi, MD, 3 mL at 02/17/20 0813 .  levothyroxine (SYNTHROID) tablet 112 mcg, 112 mcg, Oral, QAC breakfast, Karmen Bongo, MD, 112 mcg at 02/17/20 [phone] .  melatonin tablet 10 mg, 10 mg, Oral, QHS PRN, Karmen Bongo, MD, 10 mg at 02/17/20  0102 .  metoprolol tartrate (LOPRESSOR) tablet 50 mg, 50 mg, Oral, BID, Karmen Bongo, MD, 50 mg at 02/17/20 0831 .  nystatin (MYCOSTATIN) 100000 UNIT/ML suspension 500,000 Units, 5 mL, Oral, QID, Karmen Bongo, MD, 500,000 Units at 02/17/20 1633 .  sodium chloride flush (NS) 0.9 % injection 3 mL, 3 mL, Intravenous, Q12H, Karmen Bongo, MD, 3 mL at 02/17/20 0831 .  sodium chloride flush (NS) 0.9 % injection 3 mL, 3 mL, Intravenous, PRN, Karmen Bongo, MD    ALLERGIES   Patient has no known allergies.     REVIEW OF SYSTEMS    Review of Systems:  Gen:  Denies  fever, sweats, chills weigh loss  HEENT: Denies blurred vision, double vision, ear pain, eye pain, hearing loss, nose bleeds, sore throat Cardiac:  No dizziness, chest pain or heaviness, chest tightness,edema Resp:   Denies cough or sputum porduction, shortness of breath,wheezing, hemoptysis,  Gi: Denies swallowing difficulty, stomach pain, nausea or vomiting, diarrhea, constipation, bowel incontinence Gu:  Denies bladder incontinence, burning urine Ext:   Denies Joint pain, stiffness or swelling Skin: Denies  skin rash, easy bruising or bleeding or hives Endoc:  Denies polyuria, polydipsia , polyphagia or weight  change Psych:   Denies depression, insomnia or hallucinations   Other:  All other systems negative   VS: BP (!) 143/61   Pulse 69   Temp 98.2 F (36.8 C) (Oral)   Resp 16   Ht 5\' 4"  (1.626 m)   Wt (!) 167.5 kg   LMP 07/17/2012   SpO2 100%   BMI 63.39 kg/m      PHYSICAL EXAM    GENERAL:NAD, no fevers, chills, no weakness no fatigue HEAD: Normocephalic, atraumatic.  EYES: Pupils equal, round, reactive to light. Extraocular muscles intact. No scleral icterus.  MOUTH: Moist mucosal membrane. Dentition intact. No abscess noted.  EAR, NOSE, THROAT: Clear without exudates. No external lesions.  NECK: Supple. No thyromegaly. No nodules. No JVD.  PULMONARY: decreased air entry bilaterally improved  CARDIOVASCULAR: S1 and S2. Regular rate and rhythm. No murmurs, rubs, or gallops. No edema. Pedal pulses 2+ bilaterally.  GASTROINTESTINAL: Soft, nontender, nondistended. No masses. Positive bowel sounds. No hepatosplenomegaly.  MUSCULOSKELETAL: No swelling, clubbing, or edema. Range of motion full in all extremities.  NEUROLOGIC: Cranial nerves II through XII are intact. No gross focal neurological deficits. Sensation intact. Reflexes intact.  SKIN: No ulceration, lesions, rashes, or cyanosis. Skin warm and dry. Turgor intact.  PSYCHIATRIC: Mood, affect within normal limits. The patient is awake, alert and oriented x 3. Insight, judgment intact.       IMAGING    DG Chest 2 View  Result Date: 02/09/2020 CLINICAL DATA:  Cough and congestion. EXAM: CHEST - 2 VIEW COMPARISON:  October 20, 2018 FINDINGS: Mild, diffuse chronic appearing increased interstitial lung markings are seen. Mild, stable linear scarring and/or atelectasis is noted within the left lung base. There is no evidence of a pleural effusion or pneumothorax. The heart size and mediastinal contours are within normal limits. There is moderate severity calcification of the aortic arch. The visualized skeletal structures are  unremarkable. IMPRESSION: Chronic appearing increased interstitial lung markings with stable left basilar linear scarring and/or atelectasis. Electronically Signed   By: Virgina Norfolk M.D.   On: 02/09/2020 02:30   CT Chest Wo Contrast  Result Date: 02/09/2020 CLINICAL DATA:  55 year old female with cough and congestion. EXAM: CT CHEST WITHOUT CONTRAST TECHNIQUE: Multidetector CT imaging of the chest was performed following the standard protocol  without IV contrast. COMPARISON:  Chest radiographs earlier today.  Chest CTA 10/18/2014. FINDINGS: Cardiovascular: Calcified aortic atherosclerosis. Calcified coronary artery atherosclerosis. Vascular patency is not evaluated in the absence of IV contrast. No cardiomegaly or pericardial effusion. Mediastinum/Nodes: Negative.  No lymphadenopathy. Lungs/Pleura: Bilateral PE demonstrated in 2016. Confluent linear opacity in the left lower lobe could be atelectasis or chronic pulmonary scarring. Major airways remain patent. There is widely scattered bilateral patchy and indistinct pulmonary nodular opacity (series 3, image 76 bilaterally) with additional superimposed pulmonary ground-glass opacity or mosaic attenuation. No pleural effusion. Upper Abdomen: Questionable hepatomegaly but otherwise negative visible noncontrast liver, spleen, pancreas, right adrenal gland. Relatively low-density 23 mm left adrenal nodule, was not included on the 2016 CTA. Negative visible upper abdominal bowel. Musculoskeletal: Widespread thoracic spine ankylosis from flowing endplate osteophytes or syndesmophytes. Chronic right posterior 10th rib fracture with cortical thickening is new since 2016. No acute osseous abnormality identified. IMPRESSION: 1. Widely scattered patchy and indistinct nodular pulmonary opacity compatible with acute viral/atypical respiratory infection. No pleural effusion. Superimposed bilateral mosaic attenuation which can be related to small airway or small vessel  disease (and history of 2016 PE). 2. A 2.3 cm left adrenal nodule is indeterminate on noncontrast CT but probably benign. Recommend follow-up Adrenal protocol Abdomen CT (without and with contrast) in 1 year. This recommendation follows ACR consensus guidelines: Management of Incidental Adrenal Masses: A White Paper of the ACR Incidental Findings Committee. J Am Coll Radiol 2017;14:1038-1044. 3. Aortic Atherosclerosis (ICD10-I70.0). Calcified coronary artery atherosclerosis. Electronically Signed   By: Genevie Ann M.D.   On: 02/09/2020 06:22   DG Chest Port 1 View  Result Date: 02/15/2020 CLINICAL DATA:  Respiratory failure EXAM: PORTABLE CHEST 1 VIEW COMPARISON:  02/09/2020 FINDINGS: Cardiomegaly. Interstitial prominence and patchy bilateral airspace opacities, slightly worsened since prior study. No effusions or pneumothorax. IMPRESSION: Worsening interstitial prominence and patchy bilateral airspace disease. Electronically Signed   By: Rolm Baptise M.D.   On: 02/15/2020 14:22   ECHOCARDIOGRAM COMPLETE  Result Date: 02/09/2020    ECHOCARDIOGRAM REPORT   Patient Name:   GRANT HENKES Date of Exam: 02/09/2020 Medical Rec #:  [id number]      Height:       64.0 in Accession #:    [id number]     Weight:       360.0 lb Date of Birth:  1966-01-18       BSA:          2.512 m Patient Age:    6 years       BP:           157/61 mmHg Patient Gender: F              HR:           65 bpm. Exam Location:  [facility] Procedure: 2D Echo, Cardiac Doppler and Color Doppler Indications:     CHF- acute systolic 106.26  History:         Patient has prior history of Echocardiogram examinations, most                  recent 10/20/2018. Risk Factors:Hypertension. Pulmonary embolus.  Sonographer:     Sherrie Sport RDCS (AE) Referring Phys:  [id number] Karmen Bongo Diagnosing Phys: Harrell Gave End MD  Sonographer Comments: Technically difficult study due to poor echo windows, suboptimal apical window and suboptimal subcostal window. IMPRESSIONS  1.  Left ventricular ejection fraction, by estimation, is 65 to 70%. The left ventricle has  normal function. Left ventricular endocardial border not optimally defined to evaluate regional wall motion. There is severe left ventricular hypertrophy. Left ventricular diastolic parameters are consistent with Grade II diastolic dysfunction (pseudonormalization). Elevated left atrial pressure.  2. Right ventricular systolic function is normal. The right ventricular size is normal.  3. Left atrial size was moderately dilated.  4. Right atrial size was mildly dilated.  5. The mitral valve is grossly normal. No evidence of mitral valve regurgitation.  6. The aortic valve was not well visualized. Aortic valve regurgitation is not visualized. No aortic stenosis is present. FINDINGS  Left Ventricle: Left ventricular ejection fraction, by estimation, is 65 to 70%. The left ventricle has normal function. Left ventricular endocardial border not optimally defined to evaluate regional wall motion. The left ventricular internal cavity size was normal in size. There is severe left ventricular hypertrophy. Left ventricular diastolic parameters are consistent with Grade II diastolic dysfunction (pseudonormalization). Elevated left atrial pressure. Right Ventricle: The right ventricular size is normal. No increase in right ventricular wall thickness. Right ventricular systolic function is normal. Left Atrium: Left atrial size was moderately dilated. Right Atrium: Right atrial size was mildly dilated. Pericardium: The pericardium was not well visualized. Mitral Valve: The mitral valve is grossly normal. No evidence of mitral valve regurgitation. Tricuspid Valve: The tricuspid valve is not well visualized. Tricuspid valve regurgitation is trivial. Aortic Valve: The aortic valve was not well visualized. Aortic valve regurgitation is not visualized. No aortic stenosis is present. Aortic valve mean gradient measures 9.0 mmHg. Aortic valve peak  gradient measures 15.8 mmHg. Aortic valve area, by VTI measures 2.62 cm. Pulmonic Valve: The pulmonic valve was not well visualized. Pulmonic valve regurgitation is not visualized. No evidence of pulmonic stenosis. Aorta: The aortic root and ascending aorta are structurally normal, with no evidence of dilitation. Pulmonary Artery: The pulmonary artery is not well seen. IAS/Shunts: The interatrial septum was not well visualized.  LEFT VENTRICLE PLAX 2D LVIDd:         4.42 cm  Diastology LVIDs:         2.42 cm  LV e' medial:    5.77 cm/s LV PW:         1.71 cm  LV E/e' medial:  20.1 LV IVS:        1.60 cm  LV e' lateral:   11.10 cm/s LVOT diam:     2.10 cm  LV E/e' lateral: 10.5 LV SV:         107 LV SV Index:   43 LVOT Area:     3.46 cm  RIGHT VENTRICLE RV Basal diam:  3.42 cm RV S prime:     13.70 cm/s TAPSE (M-mode): 4.4 cm LEFT ATRIUM            Index       RIGHT ATRIUM           Index LA diam:      4.20 cm  1.67 cm/m  RA Area:     18.00 cm LA Vol (A2C): 71.6 ml  28.51 ml/m RA Volume:   40.40 ml  16.09 ml/m LA Vol (A4C): 107.0 ml 42.60 ml/m  AORTIC VALVE                    PULMONIC VALVE AV Area (Vmax):    2.33 cm     PV Vmax:        0.92 m/s AV Area (Vmean):   2.32 cm  PV Peak grad:   3.4 mmHg AV Area (VTI):     2.62 cm     RVOT Peak grad: 4 mmHg AV Vmax:           199.00 cm/s AV Vmean:          140.000 cm/s AV VTI:            0.409 m AV Peak Grad:      15.8 mmHg AV Mean Grad:      9.0 mmHg LVOT Vmax:         134.00 cm/s LVOT Vmean:        93.800 cm/s LVOT VTI:          0.309 m LVOT/AV VTI ratio: 0.76  AORTA Ao Root diam: 2.97 cm MITRAL VALVE                TRICUSPID VALVE MV Area (PHT): 3.20 cm     TR Peak grad:   16.2 mmHg MV Decel Time: 237 msec     TR Vmax:        201.00 cm/s MV E velocity: 116.00 cm/s MV A velocity: 79.10 cm/s   SHUNTS MV E/A ratio:  1.47         Systemic VTI:  0.31 m                             Systemic Diam: 2.10 cm Nelva Bush MD Electronically signed by Nelva Bush  MD Signature Date/Time: 02/09/2020/1:01:59 PM    Final       ASSESSMENT/PLAN   Acute on chronic hypoxemic respiratory failure -patient is net 8L negative -patient has pulmonary edema on above CT chest , will order CXR for interval changes - patient has bilateral 2+pitting LE edema and stasis dermatitis - will add aldactone to regimen -Bibasilar atelectasis - patient able to take tidal volume of 750 during examination  -weaned to 1.5L  -continue lasix 40 bid for now   Bibasilar atelectasis  - encourage patient to use IS - currently only at 750 cc tidal volume  - initiate metaNEB - Twice daily   - PT and OT prior to d/c    OSA on cpap  - patient may use our device - she shares while using CPAP here she had high amount of air leakage from full face mask inteface which caused sleep disturbances.    Thank you for allowing me to participate in the care of this patient.  Patient/Family are satisfied with care plan and all questions have been answered.  This document was prepared using Dragon voice recognition software and may include unintentional dictation errors.     Ottie Glazier, M.D.  Division of [place]

## 2020-02-17 NOTE — Progress Notes (Signed)
SATURATION QUALIFICATIONS: (This note is used to comply with regulatory documentation for home oxygen)  Patient Saturations on Room Air at Rest = 90%  Patient Saturations on Room Air while Ambulating = 78%  Patient Saturations on 2 Liters of oxygen while Ambulating = 92%  Please briefly explain why patient needs home oxygen:

## 2020-02-17 NOTE — Consult Note (Addendum)
Heart Failure Nurse Navigator Note  HFpEF 65 to 70% by echocardiogram performed on February 09, 2020.  Severe LVH.  Grade 2 diastolic dysfunction.  Moderate left atrial enlargement.  Mild right atrial enlargement.  She presented to the emergency room with complaints of a dry cough, worsening shortness of breath and lower extremity edema.  Comorbidities:  Pulmonary embolus Paroxysmal atrial fibrillation Morbid obesity with a BMI of 63.39 Hypertension Diabetes Obstructive sleep apnea compliant with CPAP Chronic kidney disease stage III Gout Arthritis  Medications:  Eliquis 5 mg twice a day Atorvastatin 20 mg at bedtime  clonidine 0.2 mg twice a day Furosemide 40 mg IV twice a day Metoprolol tartrate 50 mg twice a day  Aldactone on hold due to hyperkalemia.  Labs:  Sodium 144, potassium 4.8, chloride 104, CO2 31, BUN 58 (51), creatinine 1.7 (1.72), magnesium 2.3, BNP on admission was 197, hemoglobin 10.6 and hematocrit 36.   Assessment:  General-she is awake and alert sitting up in the chair at bedside in no acute distress.  HEENT-pupils are equal, unable to evaluate JVD due to body habitus.  Cardiac-heart tones are regular no rubs murmurs or gallops appreciated.  Chest-breath sounds are clear to posterior auscultation  Abdomen soft nontender obese.  Musculoskeletal-trace edema no pitting edema noted, and discoloration on the shins significant for venous stasis.  Psych-she is pleasant and appropriate makes good eye contact.  Neurologic speech is clear with all extremities without difficulty.     Discussed home cares with patient.  She states that she is not compliant with daily weights nor recording.  Discussed the importance of doing both, she voices understanding and states that she will start that when she goes home.  Talked about reporting 2 pound weight gain overnight or 5 pounds within a week.  Also discussed daily blood pressure readings, recording  and taking those to her physician's office at her appointments.  States that she does have a cuff but does not check it often enough.  She lives by herself and states that she is not a good cook so she tends to eat frozen dinners or gets food from a restaurant and takes it home.  She does read labels, she cited an instance where she was going to get sub  from [facility] and when she read on the label that the sub contained 1800 mg of sodium that she put it back down.   So discussed with her when she orders out asking when she is ordering if they could tell her what the sodium content of the food, she voices understanding of that.  She states that she is not on a fluid restriction but drinks 4-16 oz bottles of water daily as her kidney doctor told her to drink plenty of fluids.  So discussed once she is feeling better gradually become more active,  She states that when she is at home and feeling good she is able to walk around her apartment without any difficulty but with ability specialist today and on oxygen could only walk 80 feet before coming short of breath.  Voices understanding and states since she retired 1 year ago today she has become inactive and gained weight.  Discussed slowly returning to exercise program.  She was given heart failure teaching booklet along with the zone magnet, instructed on her follow-up appointment with Darylene Price which would be on January 11.  Discussed the heart failure videos and told her that I felt it would probably be a  good review, she states that she is willing to look at those tomorrow.  We will continue to follow along.  Pricilla Riffle RN [facility]

## 2020-02-18 ENCOUNTER — Inpatient Hospital Stay: Payer: BC Managed Care – PPO

## 2020-02-18 ENCOUNTER — Ambulatory Visit: Payer: BC Managed Care – PPO | Admitting: Family

## 2020-02-18 DIAGNOSIS — J9601 Acute respiratory failure with hypoxia: Secondary | ICD-10-CM | POA: Diagnosis not present

## 2020-02-18 DIAGNOSIS — I482 Chronic atrial fibrillation, unspecified: Secondary | ICD-10-CM | POA: Diagnosis not present

## 2020-02-18 DIAGNOSIS — I5032 Chronic diastolic (congestive) heart failure: Secondary | ICD-10-CM | POA: Diagnosis not present

## 2020-02-18 DIAGNOSIS — E039 Hypothyroidism, unspecified: Secondary | ICD-10-CM

## 2020-02-18 LAB — BASIC METABOLIC PANEL
Anion gap: 10 (ref 5–15)
BUN: 66 mg/dL — ABNORMAL HIGH (ref 6–20)
CO2: 30 mmol/L (ref 22–32)
Calcium: 8.7 mg/dL — ABNORMAL LOW (ref 8.9–10.3)
Chloride: 102 mmol/L (ref 98–111)
Creatinine, Ser: 2 mg/dL — ABNORMAL HIGH (ref 0.44–1.00)
GFR, Estimated: 29 mL/min — ABNORMAL LOW (ref >60)
Glucose, Bld: 130 mg/dL — ABNORMAL HIGH (ref 70–99)
Potassium: 4.6 mmol/L (ref 3.5–5.1)
Sodium: 142 mmol/L (ref 135–145)

## 2020-02-18 LAB — CBC
HCT: 33.7 % — ABNORMAL LOW (ref 36.0–46.0)
Hemoglobin: 10 g/dL — ABNORMAL LOW (ref 12.0–15.0)
MCH: 27.5 pg (ref 26.0–34.0)
MCHC: 29.7 g/dL — ABNORMAL LOW (ref 30.0–36.0)
MCV: 92.6 fL (ref 80.0–100.0)
Platelets: 235 10*3/uL (ref 150–400)
RBC: 3.64 MIL/uL — ABNORMAL LOW (ref 3.87–5.11)
RDW: 13.9 % (ref 11.5–15.5)
WBC: 12 10*3/uL — ABNORMAL HIGH (ref 4.0–10.5)
nRBC: 0 % (ref 0.0–0.2)

## 2020-02-18 LAB — GLUCOSE, CAPILLARY
Glucose-Capillary: 144 mg/dL — ABNORMAL HIGH (ref 70–99)
Glucose-Capillary: 150 mg/dL — ABNORMAL HIGH (ref 70–99)

## 2020-02-18 MED ORDER — FUROSEMIDE 40 MG PO TABS
40.0000 mg | ORAL_TABLET | Freq: Every day | ORAL | Status: DC
  Administered 2020-02-18: 40 mg via ORAL
  Filled 2020-02-18: qty 1

## 2020-02-18 MED ORDER — COVID-19 MRNA VACCINE (PFIZER) 30 MCG/0.3ML IM SUSP
0.3000 mL | Freq: Once | INTRAMUSCULAR | Status: AC
  Administered 2020-02-18: 0.3 mL via INTRAMUSCULAR
  Filled 2020-02-18: qty 0.3

## 2020-02-18 MED ORDER — FUROSEMIDE 40 MG PO TABS: 40.0000 mg | ORAL_TABLET | Freq: Every day | ORAL | 0 refills | Status: DC

## 2020-02-18 NOTE — TOC Transition Note (Signed)
Transition of Care [facility]) - CM/SW Discharge Note   Patient Details  Name: Nicole Schmidt MRN: [id number] Date of Birth: 29-Jan-1966  Transition of Care [facility]) CM/SW Contact:  Candie Chroman, LCSW Phone Number: 02/18/2020, 2:15 PM   Clinical Narrative: Patient has orders to discharge home today. Patient agreeable to outpatient PT. Prefers [facility]. She went there in 2019. Sent secure chat to MD asking him to sign order. Will fax once signed. Ordered home oxygen through [place]. No further concerns. CSW signing off.    Final next level of care: OP Rehab Barriers to Discharge: Barriers Resolved   Patient Goals and CMS Choice Patient states their goals for this hospitalization and ongoing recovery are:: To return home.   Choice offered to / list presented to : Patient  Discharge Placement                    Patient and family notified of of transfer: 02/18/20  Discharge Plan and Services In-house Referral: NA Discharge Planning Services: NA Post Acute Care Choice: NA          DME Arranged: Oxygen DME Agency: [employer] Date DME Agency Contacted: 02/18/20   Representative spoke with at DME Agency: [place]: NA [place] Agency: NA        Social Determinants of Health ([place]) Interventions     Readmission Risk Interventions Readmission Risk Prevention Plan 02/11/2020 10/19/2018  Transportation Screening Complete Complete  PCP or Specialist Appt within 3-5 Days Not Complete -  Not Complete comments To be scheduled when discharged. -  [place] or Home Care Consult Complete -  Social Work Consult for [place] Planning/Counseling Complete Patient refused  Palliative Care Screening Complete Not Applicable  Medication Review Press photographer) Complete Complete  Some recent data might be hidden

## 2020-02-18 NOTE — Progress Notes (Signed)
Pt  wants to take shower before discharged. Educated patient on safety precaution and fall prevention. Assistance was offered. Pt stated she wants privacy. This RN told her just want to make sure she does not fall. Patient persisted she does not need assistance with showering and she can do it by herself.

## 2020-02-18 NOTE — Discharge Summary (Signed)
Physician Discharge Summary  Nicole Schmidt BMW:[id number] DOB: 20-Dec-1965 DOA: 02/09/2020  PCP: Lenard Simmer, MD  Admit date: 02/09/2020 Discharge date: 02/18/2020  Discharge disposition: Home   Recommendations for Outpatient Follow-Up:   Follow-up with PCP in 1 week. Follow-up at CHF clinic on 02/24/2020   Discharge Diagnosis:   Principal Problem:   Acute hypoxemic respiratory failure (HCC) Active Problems:   History of breast cancer   Chronic renal disease, stage III (HCC)   Essential hypertension   Chronic diastolic heart failure (HCC)   OSA on CPAP   Morbid obesity (HCC)   Atrial fibrillation, chronic (HCC)   Dyslipidemia   Diabetes mellitus type 2 in obese ([place])   Acquired hypothyroidism   Thrush, oral    Discharge Condition: Stable.  Diet recommendation:  Diet Order            Diet - low sodium heart healthy           Diet Carb Modified           Diet Carb Modified Fluid consistency: Thin; Room service appropriate? Yes  Diet effective now                   Code Status: Full Code     Hospital Course:   [name] a 55 y.o.femalewith medical history significant forPE(2016), atrial fibrillation on Eliquis, morbid obesity, hypothyroidism, hypertension, type II DM, OSA on CPAP, CKD stage IIIa, chronic diastolic CHF, remote history of breast cancer.  She presented to the hospital because of cough, increasing shortness of breath, weight gain and lower extremity swelling.  She was found to have acute on chronic diastolic CHF.  There was also concern for community-acquired pneumonia.  She was treated with IV Lasix and empiric IV antibiotics.  She also had acute hypoxic respiratory failure requiring oxygen via nasal cannula.  2D echo showed EF estimated at 65 to 70%, severe LVH, grade 2 diastolic dysfunction, moderate left atrial enlargement and mild right atrial enlargement.  She was evaluated by the pulmonologist.  Her condition has improved.   However, she could not be weaned off of oxygen prior to discharge.  Home oxygen, 2 L/min via nasal cannula, has been prescribed for chronic hypoxic respiratory failure.  She has been started on Aldactone but this was discontinued because of hyperkalemia.  She was also on losartan prior to admission but this has been held because of hyperkalemia and kidney disease.  Her condition has improved and she is deemed stable for discharge to home today.   Medical Consultants:    Pulmonologist   Discharge Exam:    Vitals:   02/18/20 0402 02/18/20 0847 02/18/20 0924 02/18/20 1135  BP: (!) 154/94  134/73 (!) 148/63  Pulse: 62  75 71  Resp: 16  20 20   Temp: 97.7 F (36.5 C)  98 F (36.7 C) 97.6 F (36.4 C)  TempSrc: Oral  Oral Oral  SpO2: 97% 91% 98% 96%  Weight:      Height:         GEN: NAD SKIN: Warm and dry EYES: EOMI ENT: MMM CV: RRR PULM: CTA B ABD: soft, obese, NT, +BS CNS: AAO x 3, non focal EXT: Trace b/l leg edema, no tenderness   The results of significant diagnostics from this hospitalization (including imaging, microbiology, ancillary and laboratory) are listed below for reference.     Procedures and Diagnostic Studies:   DG Chest 2 View  Result Date: 02/09/2020 CLINICAL DATA:  Cough and congestion. EXAM: CHEST - 2 VIEW COMPARISON:  October 20, 2018 FINDINGS: Mild, diffuse chronic appearing increased interstitial lung markings are seen. Mild, stable linear scarring and/or atelectasis is noted within the left lung base. There is no evidence of a pleural effusion or pneumothorax. The heart size and mediastinal contours are within normal limits. There is moderate severity calcification of the aortic arch. The visualized skeletal structures are unremarkable. IMPRESSION: Chronic appearing increased interstitial lung markings with stable left basilar linear scarring and/or atelectasis. Electronically Signed   By: Virgina Norfolk M.D.   On: 02/09/2020 02:30   CT Chest  Wo Contrast  Result Date: 02/09/2020 CLINICAL DATA:  55 year old female with cough and congestion. EXAM: CT CHEST WITHOUT CONTRAST TECHNIQUE: Multidetector CT imaging of the chest was performed following the standard protocol without IV contrast. COMPARISON:  Chest radiographs earlier today.  Chest CTA 10/18/2014. FINDINGS: Cardiovascular: Calcified aortic atherosclerosis. Calcified coronary artery atherosclerosis. Vascular patency is not evaluated in the absence of IV contrast. No cardiomegaly or pericardial effusion. Mediastinum/Nodes: Negative.  No lymphadenopathy. Lungs/Pleura: Bilateral PE demonstrated in 2016. Confluent linear opacity in the left lower lobe could be atelectasis or chronic pulmonary scarring. Major airways remain patent. There is widely scattered bilateral patchy and indistinct pulmonary nodular opacity (series 3, image 76 bilaterally) with additional superimposed pulmonary ground-glass opacity or mosaic attenuation. No pleural effusion. Upper Abdomen: Questionable hepatomegaly but otherwise negative visible noncontrast liver, spleen, pancreas, right adrenal gland. Relatively low-density 23 mm left adrenal nodule, was not included on the 2016 CTA. Negative visible upper abdominal bowel. Musculoskeletal: Widespread thoracic spine ankylosis from flowing endplate osteophytes or syndesmophytes. Chronic right posterior 10th rib fracture with cortical thickening is new since 2016. No acute osseous abnormality identified. IMPRESSION: 1. Widely scattered patchy and indistinct nodular pulmonary opacity compatible with acute viral/atypical respiratory infection. No pleural effusion. Superimposed bilateral mosaic attenuation which can be related to small airway or small vessel disease (and history of 2016 PE). 2. A 2.3 cm left adrenal nodule is indeterminate on noncontrast CT but probably benign. Recommend follow-up Adrenal protocol Abdomen CT (without and with contrast) in 1 year. This recommendation  follows ACR consensus guidelines: Management of Incidental Adrenal Masses: A White Paper of the ACR Incidental Findings Committee. J Am Coll Radiol 2017;14:1038-1044. 3. Aortic Atherosclerosis (ICD10-I70.0). Calcified coronary artery atherosclerosis. Electronically Signed   By: Genevie Ann M.D.   On: 02/09/2020 06:22   ECHOCARDIOGRAM COMPLETE  Result Date: 02/09/2020    ECHOCARDIOGRAM REPORT   Patient Name:   Nicole Schmidt Date of Exam: 02/09/2020 Medical Rec #:  [id number]      Height:       64.0 in Accession #:    [id number]     Weight:       360.0 lb Date of Birth:  1965/10/20       BSA:          2.512 m Patient Age:    36 years       BP:           157/61 mmHg Patient Gender: F              HR:           65 bpm. Exam Location:  [facility] Procedure: 2D Echo, Cardiac Doppler and Color Doppler Indications:     CHF- acute systolic 626.94  History:         Patient has prior history of Echocardiogram examinations, most  recent 10/20/2018. Risk Factors:Hypertension. Pulmonary embolus.  Sonographer:     Sherrie Sport RDCS (AE) Referring Phys:  [id number] Karmen Bongo Diagnosing Phys: Harrell Gave End MD  Sonographer Comments: Technically difficult study due to poor echo windows, suboptimal apical window and suboptimal subcostal window. IMPRESSIONS  1. Left ventricular ejection fraction, by estimation, is 65 to 70%. The left ventricle has normal function. Left ventricular endocardial border not optimally defined to evaluate regional wall motion. There is severe left ventricular hypertrophy. Left ventricular diastolic parameters are consistent with Grade II diastolic dysfunction (pseudonormalization). Elevated left atrial pressure.  2. Right ventricular systolic function is normal. The right ventricular size is normal.  3. Left atrial size was moderately dilated.  4. Right atrial size was mildly dilated.  5. The mitral valve is grossly normal. No evidence of mitral valve regurgitation.  6. The aortic valve was not well  visualized. Aortic valve regurgitation is not visualized. No aortic stenosis is present. FINDINGS  Left Ventricle: Left ventricular ejection fraction, by estimation, is 65 to 70%. The left ventricle has normal function. Left ventricular endocardial border not optimally defined to evaluate regional wall motion. The left ventricular internal cavity size was normal in size. There is severe left ventricular hypertrophy. Left ventricular diastolic parameters are consistent with Grade II diastolic dysfunction (pseudonormalization). Elevated left atrial pressure. Right Ventricle: The right ventricular size is normal. No increase in right ventricular wall thickness. Right ventricular systolic function is normal. Left Atrium: Left atrial size was moderately dilated. Right Atrium: Right atrial size was mildly dilated. Pericardium: The pericardium was not well visualized. Mitral Valve: The mitral valve is grossly normal. No evidence of mitral valve regurgitation. Tricuspid Valve: The tricuspid valve is not well visualized. Tricuspid valve regurgitation is trivial. Aortic Valve: The aortic valve was not well visualized. Aortic valve regurgitation is not visualized. No aortic stenosis is present. Aortic valve mean gradient measures 9.0 mmHg. Aortic valve peak gradient measures 15.8 mmHg. Aortic valve area, by VTI measures 2.62 cm. Pulmonic Valve: The pulmonic valve was not well visualized. Pulmonic valve regurgitation is not visualized. No evidence of pulmonic stenosis. Aorta: The aortic root and ascending aorta are structurally normal, with no evidence of dilitation. Pulmonary Artery: The pulmonary artery is not well seen. IAS/Shunts: The interatrial septum was not well visualized.  LEFT VENTRICLE PLAX 2D LVIDd:         4.42 cm  Diastology LVIDs:         2.42 cm  LV e' medial:    5.77 cm/s LV PW:         1.71 cm  LV E/e' medial:  20.1 LV IVS:        1.60 cm  LV e' lateral:   11.10 cm/s LVOT diam:     2.10 cm  LV E/e' lateral:  10.5 LV SV:         107 LV SV Index:   43 LVOT Area:     3.46 cm  RIGHT VENTRICLE RV Basal diam:  3.42 cm RV S prime:     13.70 cm/s TAPSE (M-mode): 4.4 cm LEFT ATRIUM            Index       RIGHT ATRIUM           Index LA diam:      4.20 cm  1.67 cm/m  RA Area:     18.00 cm LA Vol (A2C): 71.6 ml  28.51 ml/m RA Volume:   40.40 ml  16.09 ml/m  LA Vol (A4C): 107.0 ml 42.60 ml/m  AORTIC VALVE                    PULMONIC VALVE AV Area (Vmax):    2.33 cm     PV Vmax:        0.92 m/s AV Area (Vmean):   2.32 cm     PV Peak grad:   3.4 mmHg AV Area (VTI):     2.62 cm     RVOT Peak grad: 4 mmHg AV Vmax:           199.00 cm/s AV Vmean:          140.000 cm/s AV VTI:            0.409 m AV Peak Grad:      15.8 mmHg AV Mean Grad:      9.0 mmHg LVOT Vmax:         134.00 cm/s LVOT Vmean:        93.800 cm/s LVOT VTI:          0.309 m LVOT/AV VTI ratio: 0.76  AORTA Ao Root diam: 2.97 cm MITRAL VALVE                TRICUSPID VALVE MV Area (PHT): 3.20 cm     TR Peak grad:   16.2 mmHg MV Decel Time: 237 msec     TR Vmax:        201.00 cm/s MV E velocity: 116.00 cm/s MV A velocity: 79.10 cm/s   SHUNTS MV E/A ratio:  1.47         Systemic VTI:  0.31 m                             Systemic Diam: 2.10 cm Nelva Bush MD Electronically signed by Nelva Bush MD Signature Date/Time: 02/09/2020/1:01:59 PM    Final      Labs:   Basic Metabolic Panel: Recent Labs  Lab 02/13/20 0356 02/14/20 0507 02/15/20 0533 02/16/20 0541 02/17/20 0401 02/18/20 0334  NA 145 143 144 144 144 142  K 4.9 4.8 5.0 5.2* 4.8 4.6  CL 106 105 106 107 104 102  CO2 31 30 29 30 31 30   GLUCOSE 116* 111* 123* 117* 122* 130*  BUN 56* 52* 48* 51* 58* 66*  CREATININE 1.94* 1.75* 1.66* 1.72* 1.70* 2.00*  CALCIUM 8.5* 8.7* 8.9 9.1 9.0 8.7*  MG 2.3 2.4 2.3 2.3 2.3  --    GFR Estimated Creatinine Clearance: 49.5 mL/min (A) (by C-G formula based on SCr of 2 mg/dL (H)). Liver Function Tests: No results for input(s): AST, ALT, ALKPHOS, BILITOT,  PROT, ALBUMIN in the last 168 hours. No results for input(s): LIPASE, AMYLASE in the last 168 hours. No results for input(s): AMMONIA in the last 168 hours. Coagulation profile No results for input(s): INR, PROTIME in the last 168 hours.  CBC: Recent Labs  Lab 02/14/20 0507 02/15/20 0533 02/16/20 0541 02/17/20 0401 02/18/20 0334  WBC 11.6* 11.7* 13.3* 12.6* 12.0*  HGB 9.8* 10.0* 10.8* 10.6* 10.0*  HCT 33.7* 34.2* 36.3 36.0 33.7*  MCV 93.9 94.2 93.3 93.3 92.6  PLT 220 207 237 242 235   Cardiac Enzymes: No results for input(s): CKTOTAL, CKMB, CKMBINDEX, TROPONINI in the last 168 hours. BNP: Invalid input(s): POCBNP CBG: Recent Labs  Lab 02/17/20 0810 02/17/20 1137 02/17/20 1607 02/18/20 0912 02/18/20 1151  GLUCAP 104* 177* 114* 144*  150*   D-Dimer No results for input(s): DDIMER in the last 72 hours. Hgb A1c No results for input(s): HGBA1C in the last 72 hours. Lipid Profile No results for input(s): CHOL, HDL, LDLCALC, TRIG, CHOLHDL, LDLDIRECT in the last 72 hours. Thyroid function studies No results for input(s): TSH, T4TOTAL, T3FREE, THYROIDAB in the last 72 hours.  Invalid input(s): FREET3 Anemia work up No results for input(s): VITAMINB12, FOLATE, FERRITIN, TIBC, IRON, RETICCTPCT in the last 72 hours. Microbiology Recent Results (from the past 240 hour(s))  Resp Panel by RT-PCR (Flu A&B, Covid) Nasopharyngeal Swab     Status: None   Collection Time: 02/09/20  2:00 AM   Specimen: Nasopharyngeal Swab; Nasopharyngeal(NP) swabs in vial transport medium  Result Value Ref Range Status   SARS Coronavirus 2 by RT PCR NEGATIVE NEGATIVE Final    Comment: (NOTE) SARS-CoV-2 target nucleic acids are NOT DETECTED.  The SARS-CoV-2 RNA is generally detectable in upper respiratory specimens during the acute phase of infection. The lowest concentration of SARS-CoV-2 viral copies this assay can detect is 138 copies/mL. A negative result does not preclude  SARS-Cov-2 infection and should not be used as the sole basis for treatment or other patient management decisions. A negative result may occur with  improper specimen collection/handling, submission of specimen other than nasopharyngeal swab, presence of viral mutation(s) within the areas targeted by this assay, and inadequate number of viral copies(<138 copies/mL). A negative result must be combined with clinical observations, patient history, and epidemiological information. The expected result is Negative.  Fact Sheet for Patients:  EntrepreneurPulse.com.au  Fact Sheet for Healthcare Providers:  IncredibleEmployment.be  This test is no t yet approved or cleared by the [place] FDA and  has been authorized for detection and/or diagnosis of SARS-CoV-2 by FDA under an Emergency Use Authorization (EUA). This EUA will remain  in effect (meaning this test can be used) for the duration of the COVID-19 declaration under Section 564(b)(1) of the Act, 21 U.S.C.section 360bbb-3(b)(1), unless the authorization is terminated  or revoked sooner.       Influenza A by PCR NEGATIVE NEGATIVE Final   Influenza B by PCR NEGATIVE NEGATIVE Final    Comment: (NOTE) The Xpert Xpress SARS-CoV-2/FLU/RSV plus assay is intended as an aid in the diagnosis of influenza from Nasopharyngeal swab specimens and should not be used as a sole basis for treatment. Nasal washings and aspirates are unacceptable for Xpert Xpress SARS-CoV-2/FLU/RSV testing.  Fact Sheet for Patients: EntrepreneurPulse.com.au  Fact Sheet for Healthcare Providers: IncredibleEmployment.be  This test is not yet approved or cleared by the [place] FDA and has been authorized for detection and/or diagnosis of SARS-CoV-2 by FDA under an Emergency Use Authorization (EUA). This EUA will remain in effect (meaning this test can be used) for the duration of  the COVID-19 declaration under Section 564(b)(1) of the Act, 21 U.S.C. section 360bbb-3(b)(1), unless the authorization is terminated or revoked.  Performed at [facility], [place], [place] [address]   Respiratory (~20 pathogens) panel by PCR     Status: None   Collection Time: 02/11/20 12:20 PM   Specimen: Nasopharyngeal Swab; Respiratory  Result Value Ref Range Status   Adenovirus NOT DETECTED NOT DETECTED Final   Coronavirus 229E NOT DETECTED NOT DETECTED Final    Comment: (NOTE) The Coronavirus on the Respiratory Panel, DOES NOT test for the novel  Coronavirus (2019 nCoV)    Coronavirus HKU1 NOT DETECTED NOT DETECTED Final   Coronavirus NL63 NOT  DETECTED NOT DETECTED Final   Coronavirus OC43 NOT DETECTED NOT DETECTED Final   Metapneumovirus NOT DETECTED NOT DETECTED Final   Rhinovirus / Enterovirus NOT DETECTED NOT DETECTED Final   Influenza A NOT DETECTED NOT DETECTED Final   Influenza B NOT DETECTED NOT DETECTED Final   Parainfluenza Virus 1 NOT DETECTED NOT DETECTED Final   Parainfluenza Virus 2 NOT DETECTED NOT DETECTED Final   Parainfluenza Virus 3 NOT DETECTED NOT DETECTED Final   Parainfluenza Virus 4 NOT DETECTED NOT DETECTED Final   Respiratory Syncytial Virus NOT DETECTED NOT DETECTED Final   Bordetella pertussis NOT DETECTED NOT DETECTED Final   Bordetella Parapertussis NOT DETECTED NOT DETECTED Final   Chlamydophila pneumoniae NOT DETECTED NOT DETECTED Final   Mycoplasma pneumoniae NOT DETECTED NOT DETECTED Final    Comment: Performed at [facility], [place] [address], [place], [place] [address]     Discharge Instructions:   Discharge Instructions    (HEART FAILURE PATIENTS) Call MD:  Anytime you have any of the following symptoms: 1) 3 pound weight gain in 24 hours or 5 pounds in 1 week 2) shortness of breath, with or without a dry hacking cough 3) swelling in the hands, feet or stomach 4) if you have to sleep on extra pillows  at night in order to breathe.   Complete by: As directed    AMB referral to CHF clinic   Complete by: As directed    Ambulatory referral to Physical Therapy   Complete by: As directed    Call MD for:  difficulty breathing, headache or visual disturbances   Complete by: As directed    Call MD for:  extreme fatigue   Complete by: As directed    Call MD for:  persistant dizziness or light-headedness   Complete by: As directed    Call MD for:  persistant nausea and vomiting   Complete by: As directed    Diet - low sodium heart healthy   Complete by: As directed    Diet Carb Modified   Complete by: As directed    Discharge instructions   Complete by: As directed    Losartan was discontinued at discharge because of recent elevation of potassium.  Follow-up with PCP for further recommendations regarding restarting losartan.   Increase activity slowly   Complete by: As directed      Allergies as of 02/18/2020   No Known Allergies     Medication List    STOP taking these medications   losartan 50 MG tablet Commonly known as: COZAAR     TAKE these medications   acetaminophen 500 MG tablet Commonly known as: TYLENOL Take 1,000 mg by mouth every 6 (six) hours as needed (for pain).   amLODipine 5 MG tablet Commonly known as: NORVASC Take 5 mg by mouth 2 (two) times daily.   atorvastatin 20 MG tablet Commonly known as: LIPITOR Take 20 mg by mouth at bedtime.   calcium carbonate 1500 (600 Ca) MG Tabs tablet Commonly known as: OSCAL Take 600 mg of elemental calcium by mouth daily.   CINNAMON PO Take 1,000 mg by mouth daily.   cloNIDine 0.2 MG tablet Commonly known as: CATAPRES Take 1 tablet (0.2 mg total) by mouth 2 (two) times daily.   CORICIDIN HBP COLD/FLU PO Take 2 tablets by mouth 2 (two) times daily as needed.   CRANBERRY CONCENTRATE PO Take 1 capsule by mouth daily.   Dulaglutide 1.5 MG/0.5ML Sopn Inject 1.5 mg into the  skin every Monday. In the evening    Eliquis 5 MG Tabs tablet Generic drug: apixaban TAKE 1 TABLET TWICE A DAY   folic acid 334 MCG tablet Commonly known as: FOLVITE Take 800 mcg by mouth daily.   furosemide 40 MG tablet Commonly known as: LASIX Take 1 tablet (40 mg total) by mouth daily. Take 1 tablet (20 mg) by mouth once daily What changed:   medication strength  how much to take  how to take this  when to take this   Klor-Con M20 20 MEQ tablet Generic drug: potassium chloride SA TAKE 1 TABLET DAILY   levothyroxine 112 MCG tablet Commonly known as: SYNTHROID Take 112 mcg daily before breakfast by mouth.   Melatonin 10 MG Tabs Take 10 mg by mouth at bedtime.   metoprolol tartrate 50 MG tablet Commonly known as: LOPRESSOR TAKE 1 TABLET TWICE A DAY   nystatin powder Commonly known as: MYCOSTATIN/NYSTOP Apply 1 g topically 2 (two) times daily as needed (rash (skin folds)).   vitamin C 500 MG tablet Commonly known as: ASCORBIC ACID Take 500 mg by mouth 2 (two) times daily.   Vitamin D3 1.25 MG (50000 UT) Tabs Take 50,000 Units by mouth every Friday.            Durable Medical Equipment  (From admission, onward)         Start     Ordered   02/18/20 1331  DME Oxygen  Once       Question Answer Comment  Length of Need Lifetime   Mode or (Route) Nasal cannula   Liters per Minute 2   Frequency Continuous (stationary and portable oxygen unit needed)   Oxygen conserving device Yes   Oxygen delivery system Gas      02/18/20 1334   02/15/20 1337  For home use only DME oxygen  Once       Question Answer Comment  Length of Need Lifetime   Mode or (Route) Nasal cannula   Liters per Minute 2   Frequency Continuous (stationary and portable oxygen unit needed)   Oxygen delivery system Gas      02/15/20 1336          Follow-up Information    [facility]. Go on 02/24/2020.   Specialty: Cardiology Why: at 8:30am. Enter through the [place]  entrance Contact information: [place] [phone]               Time coordinating discharge: 32 minutes  Signed:  Jennye Boroughs  [facility] 02/18/2020, 2:40 PM   Pager on [email or web address]. If 7PM-7AM, please contact night-coverage at [email or web address]

## 2020-02-18 NOTE — Progress Notes (Addendum)
   Heart Failure Nurse Navigator Note  HFpEF 65-70%.  Severe LVH, Grade II diastolic dysfunction.  Moderate left atrial enlargement.  Mild right atrial enlargement  She presented to the emergency room with complaints of worsening shortness of breath on exertion, dry cough and lower extremity edema.   Comorbidities:  Pulmonary embolus on Eliquis Paroxysmal atrial fibrillation Morbid obesity Hypertension Diabetes Obstructive sleep apnea compliant with CPAP Chronic kidney disease stage III Gout Arthritis   Medications:   Eliquis 5 mg 2 times a day Lipitor 20 mg at at bedtime Clonidine 0.2 mg 2 times a day Metoprolol tartrate 50 mg 2 times a day  Aldactone currently on hold due to hyperkalemia. Furosemide has been discontinued  Labs:  Sodium 142, potassium 4.6, chloride 102, CO2 30, BUN 66 up from 58 of yesterday, creatinine 2.0 up from 1.70 yesterday.   Assessment:  General-she is awake and alert sitting up in the chair at bedside.  HEENT-pulls are equal.   Cardiac-tones of regular rate and rhythm.   Chest-breath sounds are clear to posterior auscultation.  Abdomen-soft rounded obese.   Musculoskeletal there is no lower extremity edema.  Psych-is pleasant and appropriate makes good eye contact.  Neurologic-moves all extremities, speech is clear.    She states that she is within about 90% of back to her baseline.  She was able to ambulate to the bathroom off oxygen and felt when she returned to the chair that she was not huffing and puffing.  She is agreeable with reviewing heart failure videos.  She is to be discharged today and follow up in the heart failure clinic next week.   Pricilla Riffle RN, CHFN

## 2020-02-18 NOTE — Progress Notes (Signed)
administered Covid booster vaccine [employer]) with no adverse reactions. Patient mental status at baseline.

## 2020-02-18 NOTE — Progress Notes (Signed)
Discharge order received. Patient mental status is at baseline. Vital signs stable . No signs of acute distress. Discharge instructions given. Patient verbalized understanding. No other issues noted at this time.   

## 2020-02-18 NOTE — Progress Notes (Addendum)
Mobility Specialist - Progress Note   02/18/20 1047  Mobility  Activity Ambulated in room;Ambulated in hall  Level of Assistance Modified independent, requires aide device or extra time  Assistive Device None  Distance Ambulated (ft) 220 ft  Mobility Response Tolerated well  Mobility performed by Mobility specialist  $Mobility charge 1 Mobility    Pre-mobility: 68 HR, 94% SpO2 During mobility: 107 HR, 89% SpO2 Post-mobility: 87 HR, 87% SpO2   1st attempt, pt watching a heart failure video per RN request. Re-attempted at this time, pt agreed to session. Pt sitting on recliner upon arrival. Pt resting on 1L O2 [place]. Pt ambulated 220' total. Pt independent t/o session, only needing assist to handle portable O2 tank. After ambulating 80', pt c/o feeling "winded". O2 desat to 84%. Increased O2 to 2L. Noted O2 sat up to 89% after a standing rest break for ~3 minutes. Pt remained on 2L for ambulation. No LOB noted. Overall, pt tolerated session well. Pt left standing by bathroom door w/ 1L O2 [place] applied. Pt preparing to use the bathroom. Pt independent and safe to mobilize in room alone.     Braylinn Gulden Mobility Specialist  02/18/20, 11:01 AM

## 2020-02-18 NOTE — Progress Notes (Signed)
Pulmonary Medicine          Date: 02/18/2020,   MRN# [id number] Nicole Schmidt May 20, 1965     AdmissionWeight: (!) 163.3 kg                 CurrentWeight: (!) 167.5 kg  Referring physician: Dr Arbutus Ped    CHIEF COMPLAINT:   Persistent respiratory distress with increased O2 requirement   HISTORY OF PRESENT ILLNESS   Nicole Verret Gibsonis a 55 y.o.femalewith medical history significant ofPE(2016); afib on Eliquis; morbid obesity (BMI 61.79); hypothyroidism; HTN; DM;OSA on CPAP (autopap);stage 3 CKD; chronic diastolic CHF; and remote breast cancer presenting with SOB. Friday night, she had a dry cough and was around family. She sneezed a lot and then felt congested and so stayed home after. She could tell that she wasn't able to take a deep breath. She has chronic DOE but usually recovers quickly. Since Friday, she has been more SOB. +LE edema, possibly mildly worse than usual. She sleeps in a recliner at baseline. She has gained weight, maybe 20 pounds this year from lack of mobility."In the ED, O2 sat 87 on room air.  Covid and influenza negative.  BNP mildly elevated 197.9.  Leukocytosis 15.4k CT chest without contrast (due to renal function) showed Widely scattered patchy and indistinct nodular pulmonary opacity compatible with acute viral/atypical respiratory infection. No pleural effusion. Superimposed bilateral mosaic attenuation which can be related to small airway or small vessel disease (and history of 2016 PE).Admitted for further evaluation and management of suspected CHF exacerbation, and likely atypical pneumonia.  Initially diuresed with IV Lasix, since stopped due to worsened renal function.  Started on empiric antibiotics for PNA vs bronchitis.Echo showed EG 65-70%, severe LVH, grade II diastolic dysfunction, moderate LA dilation, mild RA dilation. She was previosly on O2 only in 2016 transiently while with acute PE.    02/16/20- patient is improved overnight.   She diuresed well with aldactone and lasix and now transitioned to bid lasix with net 3600 UOP overnight and total >12L negative.  She was able to be weaned to 1.5L/min [name].  She works with RT using [place] and found this to be challenging but effective.  Plan to continue current care plan and PT/OT.    02/17/20- patient is further improved. She had vigorous urine output and has been able to take deeper breaths. She was able to walk today and did require 1-2L/min supplemental O2.  She thinks now she is near her baseline. Plan to repeat CXR in am and continue current regimen. LE edema is 2+ improved from 3+. She is scheduled to get booster for covid tommorow.    02/18/20- patient is improved , she walked today and did have a very mild drop in O2 saturation. She is still with mild LE pitting edema. She is optimized for d/c home. We will evaluate on outpatient basis.   PAST MEDICAL HISTORY   Past Medical History:  Diagnosis Date  . (HFpEF) heart failure with preserved ejection fraction ([place])    a. 10/2018 Echo: EF 60-65%, diast dysfxn, RVSP 50.7 mmHg, mildly dil LA.  Marland Kitchen Acquired hypothyroidism 02/09/2020  . Anemia   . Arthritis   . Breast cancer ([place]) 2014   right breast cancer  . Breast cancer of upper-inner quadrant of right female breast ([place])    Right breast invasive CA and DCIS , 7 mm T1,N0,M0. Er/PR pos, her 2 negative.  Margins 1 mm.  . CHF (congestive heart failure) (  [place])   . CKD (chronic kidney disease), stage III ([place])   . Diabetes mellitus type 2 in obese ([place]) 02/09/2020  . Diabetes mellitus without complication ([place])   . Dyslipidemia 02/09/2020  . Essential hypertension   . Gout   . Hypothyroidism   . Menopause    age 34  . Morbid obesity ([place])   . Persistent atrial fibrillation ([place])    a. Dx 10/2018 in setting of PNA. CHA2DS2VASc = 4-->Eliquis; b. 11/2018 s/p successful DCCV.  Marland Kitchen Personal history of radiation therapy   . Pulmonary embolism ([place]) 10/2014   a. Chronic eliquis.   . Thyroid goiter      SURGICAL HISTORY   Past Surgical History:  Procedure Laterality Date  . BREAST BIOPSY Right 2014   breast ca  . BREAST EXCISIONAL BIOPSY Right 07/19/2012   breast ca  . BREAST SURGERY Right 2014   with sentinel node bx subareaolar duct excision  . CARDIOVERSION N/A 11/28/2018   Procedure: CARDIOVERSION;  Surgeon: Minna Merritts, MD;  Location: [facility] ORS;  Service: Cardiovascular;  Laterality: N/A;  . COLONOSCOPY WITH PROPOFOL N/A 01/30/2017   Procedure: COLONOSCOPY WITH PROPOFOL;  Surgeon: Christene Lye, MD;  Location: [facility] ENDOSCOPY;  Service: Endoscopy;  Laterality: N/A;     FAMILY HISTORY   Family History  Problem Relation Age of Onset  . Diabetes Father   . Hypertension Father   . Parkinson's disease Father   . Hypertension Mother   . Rheum arthritis Mother   . Atrial fibrillation Mother   . Heart failure Mother   . Heart attack Mother        died in her mid-70's.  . Colon cancer Cousin 109  . Breast cancer Neg Hx      SOCIAL HISTORY   Social History   Tobacco Use  . Smoking status: Never Smoker  . Smokeless tobacco: Never Used  Vaping Use  . Vaping Use: Never used  Substance Use Topics  . Alcohol use: No    Alcohol/week: 0.0 standard drinks  . Drug use: No     MEDICATIONS    Home Medication:    Current Medication:  Current Facility-Administered Medications:  .  0.9 %  sodium chloride infusion, 250 mL, Intravenous, PRN, Karmen Bongo, MD, Stopped at 02/14/20 0054 .  apixaban (ELIQUIS) tablet 5 mg, 5 mg, Oral, BID, Karmen Bongo, MD, 5 mg at 02/18/20 0919 .  atorvastatin (LIPITOR) tablet 20 mg, 20 mg, Oral, QHS, Karmen Bongo, MD, 20 mg at 02/17/20 2136 .  cloNIDine (CATAPRES) tablet 0.2 mg, 0.2 mg, Oral, BID, Karmen Bongo, MD, 0.2 mg at 02/18/20 0919 .  dextromethorphan-guaiFENesin ([place] DM) 30-600 MG per 12 hr tablet 1 tablet, 1 tablet, Oral, BID, Nicole Kindred A, DO, 1 tablet at 41/96/22 2979 .   folic acid (FOLVITE) tablet 1 mg, 1,000 mcg, Oral, Daily, Karmen Bongo, MD, 1 mg at 02/18/20 0919 .  furosemide (LASIX) tablet 40 mg, 40 mg, Oral, Daily, Jennye Boroughs, MD, 40 mg at 02/18/20 1212 .  hydrALAZINE (APRESOLINE) tablet 25 mg, 25 mg, Oral, Q6H PRN, Nicole Kindred A, DO, 25 mg at 02/13/20 0511 .  insulin aspart (novoLOG) injection 0-15 Units, 0-15 Units, Subcutaneous, TID WC, Karmen Bongo, MD, 2 Units at 02/18/20 1212 .  ipratropium-albuterol (DUONEB) 0.5-2.5 (3) MG/3ML nebulizer solution 3 mL, 3 mL, Nebulization, BID, Enzo Bi, MD, 3 mL at 02/18/20 0847 .  levothyroxine (SYNTHROID) tablet 112 mcg, 112 mcg, Oral, QAC breakfast, Karmen Bongo, MD, 112 mcg at  02/18/20 0544 .  melatonin tablet 10 mg, 10 mg, Oral, QHS PRN, Karmen Bongo, MD, 10 mg at 02/18/20 0003 .  metoprolol tartrate (LOPRESSOR) tablet 50 mg, 50 mg, Oral, BID, Karmen Bongo, MD, 50 mg at 02/18/20 0919 .  nystatin (MYCOSTATIN) 100000 UNIT/ML suspension 500,000 Units, 5 mL, Oral, QID, Karmen Bongo, MD, 500,000 Units at 02/18/20 0919 .  sodium chloride flush (NS) 0.9 % injection 3 mL, 3 mL, Intravenous, Q12H, Karmen Bongo, MD, 3 mL at 02/18/20 0920 .  sodium chloride flush (NS) 0.9 % injection 3 mL, 3 mL, Intravenous, PRN, Karmen Bongo, MD    ALLERGIES   Patient has no known allergies.     REVIEW OF SYSTEMS    Review of Systems:  Gen:  Denies  fever, sweats, chills weigh loss  HEENT: Denies blurred vision, double vision, ear pain, eye pain, hearing loss, nose bleeds, sore throat Cardiac:  No dizziness, chest pain or heaviness, chest tightness,edema Resp:   Denies cough or sputum porduction, shortness of breath,wheezing, hemoptysis,  Gi: Denies swallowing difficulty, stomach pain, nausea or vomiting, diarrhea, constipation, bowel incontinence Gu:  Denies bladder incontinence, burning urine Ext:   Denies Joint pain, stiffness or swelling Skin: Denies  skin rash, easy bruising or bleeding  or hives Endoc:  Denies polyuria, polydipsia , polyphagia or weight change Psych:   Denies depression, insomnia or hallucinations   Other:  All other systems negative   VS: BP (!) 148/63 (BP Location: Left Leg)   Pulse 71   Temp 97.6 F (36.4 C) (Oral)   Resp 20   Ht 5\' 4"  (1.626 m)   Wt (!) 167.5 kg   LMP 07/17/2012   SpO2 96%   BMI 63.39 kg/m      PHYSICAL EXAM    GENERAL:NAD, no fevers, chills, no weakness no fatigue HEAD: Normocephalic, atraumatic.  EYES: Pupils equal, round, reactive to light. Extraocular muscles intact. No scleral icterus.  MOUTH: Moist mucosal membrane. Dentition intact. No abscess noted.  EAR, NOSE, THROAT: Clear without exudates. No external lesions.  NECK: Supple. No thyromegaly. No nodules. No JVD.  PULMONARY: decreased air entry bilaterally improved  CARDIOVASCULAR: S1 and S2. Regular rate and rhythm. No murmurs, rubs, or gallops. No edema. Pedal pulses 2+ bilaterally.  GASTROINTESTINAL: Soft, nontender, nondistended. No masses. Positive bowel sounds. No hepatosplenomegaly.  MUSCULOSKELETAL: No swelling, clubbing, or edema. Range of motion full in all extremities.  NEUROLOGIC: Cranial nerves II through XII are intact. No gross focal neurological deficits. Sensation intact. Reflexes intact.  SKIN: No ulceration, lesions, rashes, or cyanosis. Skin warm and dry. Turgor intact.  PSYCHIATRIC: Mood, affect within normal limits. The patient is awake, alert and oriented x 3. Insight, judgment intact.       IMAGING    DG Chest 2 View  Result Date: 02/09/2020 CLINICAL DATA:  Cough and congestion. EXAM: CHEST - 2 VIEW COMPARISON:  October 20, 2018 FINDINGS: Mild, diffuse chronic appearing increased interstitial lung markings are seen. Mild, stable linear scarring and/or atelectasis is noted within the left lung base. There is no evidence of a pleural effusion or pneumothorax. The heart size and mediastinal contours are within normal limits. There is  moderate severity calcification of the aortic arch. The visualized skeletal structures are unremarkable. IMPRESSION: Chronic appearing increased interstitial lung markings with stable left basilar linear scarring and/or atelectasis. Electronically Signed   By: Virgina Norfolk M.D.   On: 02/09/2020 02:30   CT Chest Wo Contrast  Result Date: 02/09/2020 CLINICAL DATA:  55 year old female with cough and congestion. EXAM: CT CHEST WITHOUT CONTRAST TECHNIQUE: Multidetector CT imaging of the chest was performed following the standard protocol without IV contrast. COMPARISON:  Chest radiographs earlier today.  Chest CTA 10/18/2014. FINDINGS: Cardiovascular: Calcified aortic atherosclerosis. Calcified coronary artery atherosclerosis. Vascular patency is not evaluated in the absence of IV contrast. No cardiomegaly or pericardial effusion. Mediastinum/Nodes: Negative.  No lymphadenopathy. Lungs/Pleura: Bilateral PE demonstrated in 2016. Confluent linear opacity in the left lower lobe could be atelectasis or chronic pulmonary scarring. Major airways remain patent. There is widely scattered bilateral patchy and indistinct pulmonary nodular opacity (series 3, image 76 bilaterally) with additional superimposed pulmonary ground-glass opacity or mosaic attenuation. No pleural effusion. Upper Abdomen: Questionable hepatomegaly but otherwise negative visible noncontrast liver, spleen, pancreas, right adrenal gland. Relatively low-density 23 mm left adrenal nodule, was not included on the 2016 CTA. Negative visible upper abdominal bowel. Musculoskeletal: Widespread thoracic spine ankylosis from flowing endplate osteophytes or syndesmophytes. Chronic right posterior 10th rib fracture with cortical thickening is new since 2016. No acute osseous abnormality identified. IMPRESSION: 1. Widely scattered patchy and indistinct nodular pulmonary opacity compatible with acute viral/atypical respiratory infection. No pleural effusion.  Superimposed bilateral mosaic attenuation which can be related to small airway or small vessel disease (and history of 2016 PE). 2. A 2.3 cm left adrenal nodule is indeterminate on noncontrast CT but probably benign. Recommend follow-up Adrenal protocol Abdomen CT (without and with contrast) in 1 year. This recommendation follows ACR consensus guidelines: Management of Incidental Adrenal Masses: A White Paper of the ACR Incidental Findings Committee. J Am Coll Radiol 2017;14:1038-1044. 3. Aortic Atherosclerosis (ICD10-I70.0). Calcified coronary artery atherosclerosis. Electronically Signed   By: Genevie Ann M.D.   On: 02/09/2020 06:22   DG Chest Port 1 View  Result Date: 02/18/2020 CLINICAL DATA:  Shortness of breath. EXAM: PORTABLE CHEST 1 VIEW COMPARISON:  02/15/2020. FINDINGS: Stable cardiomegaly. Low lung volumes. Diffuse bilateral interstitial prominence, unchanged. Interstitial edema and/or pneumonitis could present this fashion. No pleural effusion or pneumothorax. IMPRESSION: 1. Stable cardiomegaly. 2. Low lung volumes. Diffuse bilateral interstitial prominence, unchanged. Interstitial edema and/or pneumonitis could present in this fashion. Electronically Signed   By: Marcello Moores  Register   On: 02/18/2020 05:15   DG Chest Port 1 View  Result Date: 02/15/2020 CLINICAL DATA:  Respiratory failure EXAM: PORTABLE CHEST 1 VIEW COMPARISON:  02/09/2020 FINDINGS: Cardiomegaly. Interstitial prominence and patchy bilateral airspace opacities, slightly worsened since prior study. No effusions or pneumothorax. IMPRESSION: Worsening interstitial prominence and patchy bilateral airspace disease. Electronically Signed   By: Rolm Baptise M.D.   On: 02/15/2020 14:22   ECHOCARDIOGRAM COMPLETE  Result Date: 02/09/2020    ECHOCARDIOGRAM REPORT   Patient Name:   JAZELLE ACHEY Date of Exam: 02/09/2020 Medical Rec #:  [id number]      Height:       64.0 in Accession #:    [id number]     Weight:       360.0 lb Date of Birth:   [birth year]       BSA:          2.512 m Patient Age:    36 years       BP:           157/61 mmHg Patient Gender: F              HR:           65 bpm. Exam Location:  [facility] Procedure: 2D Echo, Cardiac Doppler and  Color Doppler Indications:     CHF- acute systolic 950.93  History:         Patient has prior history of Echocardiogram examinations, most                  recent 10/20/2018. Risk Factors:Hypertension. Pulmonary embolus.  Sonographer:     Sherrie Sport RDCS (AE) Referring Phys:  [id number] Karmen Bongo Diagnosing Phys: Harrell Gave End MD  Sonographer Comments: Technically difficult study due to poor echo windows, suboptimal apical window and suboptimal subcostal window. IMPRESSIONS  1. Left ventricular ejection fraction, by estimation, is 65 to 70%. The left ventricle has normal function. Left ventricular endocardial border not optimally defined to evaluate regional wall motion. There is severe left ventricular hypertrophy. Left ventricular diastolic parameters are consistent with Grade II diastolic dysfunction (pseudonormalization). Elevated left atrial pressure.  2. Right ventricular systolic function is normal. The right ventricular size is normal.  3. Left atrial size was moderately dilated.  4. Right atrial size was mildly dilated.  5. The mitral valve is grossly normal. No evidence of mitral valve regurgitation.  6. The aortic valve was not well visualized. Aortic valve regurgitation is not visualized. No aortic stenosis is present. FINDINGS  Left Ventricle: Left ventricular ejection fraction, by estimation, is 65 to 70%. The left ventricle has normal function. Left ventricular endocardial border not optimally defined to evaluate regional wall motion. The left ventricular internal cavity size was normal in size. There is severe left ventricular hypertrophy. Left ventricular diastolic parameters are consistent with Grade II diastolic dysfunction (pseudonormalization). Elevated left atrial pressure. Right Ventricle:  The right ventricular size is normal. No increase in right ventricular wall thickness. Right ventricular systolic function is normal. Left Atrium: Left atrial size was moderately dilated. Right Atrium: Right atrial size was mildly dilated. Pericardium: The pericardium was not well visualized. Mitral Valve: The mitral valve is grossly normal. No evidence of mitral valve regurgitation. Tricuspid Valve: The tricuspid valve is not well visualized. Tricuspid valve regurgitation is trivial. Aortic Valve: The aortic valve was not well visualized. Aortic valve regurgitation is not visualized. No aortic stenosis is present. Aortic valve mean gradient measures 9.0 mmHg. Aortic valve peak gradient measures 15.8 mmHg. Aortic valve area, by VTI measures 2.62 cm. Pulmonic Valve: The pulmonic valve was not well visualized. Pulmonic valve regurgitation is not visualized. No evidence of pulmonic stenosis. Aorta: The aortic root and ascending aorta are structurally normal, with no evidence of dilitation. Pulmonary Artery: The pulmonary artery is not well seen. IAS/Shunts: The interatrial septum was not well visualized.  LEFT VENTRICLE PLAX 2D LVIDd:         4.42 cm  Diastology LVIDs:         2.42 cm  LV e' medial:    5.77 cm/s LV PW:         1.71 cm  LV E/e' medial:  20.1 LV IVS:        1.60 cm  LV e' lateral:   11.10 cm/s LVOT diam:     2.10 cm  LV E/e' lateral: 10.5 LV SV:         107 LV SV Index:   43 LVOT Area:     3.46 cm  RIGHT VENTRICLE RV Basal diam:  3.42 cm RV S prime:     13.70 cm/s TAPSE (M-mode): 4.4 cm LEFT ATRIUM            Index       RIGHT ATRIUM  Index LA diam:      4.20 cm  1.67 cm/m  RA Area:     18.00 cm LA Vol (A2C): 71.6 ml  28.51 ml/m RA Volume:   40.40 ml  16.09 ml/m LA Vol (A4C): 107.0 ml 42.60 ml/m  AORTIC VALVE                    PULMONIC VALVE AV Area (Vmax):    2.33 cm     PV Vmax:        0.92 m/s AV Area (Vmean):   2.32 cm     PV Peak grad:   3.4 mmHg AV Area (VTI):     2.62 cm      RVOT Peak grad: 4 mmHg AV Vmax:           199.00 cm/s AV Vmean:          140.000 cm/s AV VTI:            0.409 m AV Peak Grad:      15.8 mmHg AV Mean Grad:      9.0 mmHg LVOT Vmax:         134.00 cm/s LVOT Vmean:        93.800 cm/s LVOT VTI:          0.309 m LVOT/AV VTI ratio: 0.76  AORTA Ao Root diam: 2.97 cm MITRAL VALVE                TRICUSPID VALVE MV Area (PHT): 3.20 cm     TR Peak grad:   16.2 mmHg MV Decel Time: 237 msec     TR Vmax:        201.00 cm/s MV E velocity: 116.00 cm/s MV A velocity: 79.10 cm/s   SHUNTS MV E/A ratio:  1.47         Systemic VTI:  0.31 m                             Systemic Diam: 2.10 cm Nelva Bush MD Electronically signed by Nelva Bush MD Signature Date/Time: 02/09/2020/1:01:59 PM    Final       ASSESSMENT/PLAN   Acute on chronic hypoxemic respiratory failure -patient is net 8L negative -patient has pulmonary edema on above CT chest , will order CXR for interval changes - patient has bilateral 2+pitting LE edema and stasis dermatitis - will add aldactone to regimen -Bibasilar atelectasis - patient able to take tidal volume of 750 during examination  -weaned to 1.5L  -continue lasix 40 once daily on outpatiet basis.   Bibasilar atelectasis  - encourage patient to use IS - currently only at 750 cc tidal volume  - initiate metaNEB - Twice daily   - PT and OT prior to d/c    OSA on cpap  - patient may use our device - she shares while using CPAP here she had high amount of air leakage from full face mask inteface which caused sleep disturbances.    Thank you for allowing me to participate in the care of this patient.  Patient/Family are satisfied with care plan and all questions have been answered.  This document was prepared using Dragon voice recognition software and may include unintentional dictation errors.     Ottie Glazier, M.D.  Division of [place]

## 2020-02-20 ENCOUNTER — Other Ambulatory Visit: Payer: Self-pay

## 2020-02-20 ENCOUNTER — Emergency Department: Payer: BC Managed Care – PPO

## 2020-02-20 ENCOUNTER — Telehealth: Payer: Self-pay | Admitting: Cardiovascular Disease

## 2020-02-20 DIAGNOSIS — G4733 Obstructive sleep apnea (adult) (pediatric): Secondary | ICD-10-CM | POA: Diagnosis present

## 2020-02-20 DIAGNOSIS — I4819 Other persistent atrial fibrillation: Secondary | ICD-10-CM | POA: Diagnosis not present

## 2020-02-20 DIAGNOSIS — M109 Gout, unspecified: Secondary | ICD-10-CM | POA: Diagnosis present

## 2020-02-20 DIAGNOSIS — D6859 Other primary thrombophilia: Secondary | ICD-10-CM | POA: Diagnosis present

## 2020-02-20 DIAGNOSIS — J9621 Acute and chronic respiratory failure with hypoxia: Secondary | ICD-10-CM | POA: Diagnosis not present

## 2020-02-20 DIAGNOSIS — Z7901 Long term (current) use of anticoagulants: Secondary | ICD-10-CM | POA: Diagnosis not present

## 2020-02-20 DIAGNOSIS — Z20822 Contact with and (suspected) exposure to covid-19: Secondary | ICD-10-CM | POA: Diagnosis not present

## 2020-02-20 DIAGNOSIS — E1122 Type 2 diabetes mellitus with diabetic chronic kidney disease: Secondary | ICD-10-CM | POA: Diagnosis present

## 2020-02-20 DIAGNOSIS — M6281 Muscle weakness (generalized): Secondary | ICD-10-CM | POA: Diagnosis not present

## 2020-02-20 DIAGNOSIS — N1832 Chronic kidney disease, stage 3b: Secondary | ICD-10-CM | POA: Diagnosis present

## 2020-02-20 DIAGNOSIS — I480 Paroxysmal atrial fibrillation: Secondary | ICD-10-CM | POA: Diagnosis not present

## 2020-02-20 DIAGNOSIS — E039 Hypothyroidism, unspecified: Secondary | ICD-10-CM | POA: Diagnosis present

## 2020-02-20 DIAGNOSIS — Z923 Personal history of irradiation: Secondary | ICD-10-CM | POA: Diagnosis not present

## 2020-02-20 DIAGNOSIS — Z853 Personal history of malignant neoplasm of breast: Secondary | ICD-10-CM | POA: Diagnosis not present

## 2020-02-20 DIAGNOSIS — I517 Cardiomegaly: Secondary | ICD-10-CM | POA: Diagnosis not present

## 2020-02-20 DIAGNOSIS — E785 Hyperlipidemia, unspecified: Secondary | ICD-10-CM | POA: Diagnosis present

## 2020-02-20 DIAGNOSIS — Z9981 Dependence on supplemental oxygen: Secondary | ICD-10-CM | POA: Diagnosis not present

## 2020-02-20 DIAGNOSIS — D6869 Other thrombophilia: Secondary | ICD-10-CM | POA: Diagnosis not present

## 2020-02-20 DIAGNOSIS — I10 Essential (primary) hypertension: Secondary | ICD-10-CM | POA: Diagnosis not present

## 2020-02-20 DIAGNOSIS — R0602 Shortness of breath: Secondary | ICD-10-CM | POA: Diagnosis not present

## 2020-02-20 DIAGNOSIS — Z86711 Personal history of pulmonary embolism: Secondary | ICD-10-CM

## 2020-02-20 DIAGNOSIS — Z79890 Hormone replacement therapy (postmenopausal): Secondary | ICD-10-CM

## 2020-02-20 DIAGNOSIS — R262 Difficulty in walking, not elsewhere classified: Secondary | ICD-10-CM | POA: Diagnosis not present

## 2020-02-20 DIAGNOSIS — I5033 Acute on chronic diastolic (congestive) heart failure: Secondary | ICD-10-CM | POA: Diagnosis present

## 2020-02-20 DIAGNOSIS — I110 Hypertensive heart disease with heart failure: Secondary | ICD-10-CM | POA: Diagnosis not present

## 2020-02-20 DIAGNOSIS — I130 Hypertensive heart and chronic kidney disease with heart failure and stage 1 through stage 4 chronic kidney disease, or unspecified chronic kidney disease: Principal | ICD-10-CM | POA: Diagnosis present

## 2020-02-20 DIAGNOSIS — Z9989 Dependence on other enabling machines and devices: Secondary | ICD-10-CM | POA: Diagnosis not present

## 2020-02-20 DIAGNOSIS — Z6841 Body Mass Index (BMI) 40.0 and over, adult: Secondary | ICD-10-CM

## 2020-02-20 DIAGNOSIS — Z79899 Other long term (current) drug therapy: Secondary | ICD-10-CM | POA: Diagnosis not present

## 2020-02-20 DIAGNOSIS — I509 Heart failure, unspecified: Secondary | ICD-10-CM | POA: Diagnosis not present

## 2020-02-20 LAB — CBC
HCT: 34.3 % — ABNORMAL LOW (ref 36.0–46.0)
Hemoglobin: 10.1 g/dL — ABNORMAL LOW (ref 12.0–15.0)
MCH: 27.7 pg (ref 26.0–34.0)
MCHC: 29.4 g/dL — ABNORMAL LOW (ref 30.0–36.0)
MCV: 94.2 fL (ref 80.0–100.0)
Platelets: 229 10*3/uL (ref 150–400)
RBC: 3.64 MIL/uL — ABNORMAL LOW (ref 3.87–5.11)
RDW: 13.9 % (ref 11.5–15.5)
WBC: 11.4 10*3/uL — ABNORMAL HIGH (ref 4.0–10.5)
nRBC: 0 % (ref 0.0–0.2)

## 2020-02-20 LAB — COMPREHENSIVE METABOLIC PANEL
ALT: 26 U/L (ref 0–44)
AST: 18 U/L (ref 15–41)
Albumin: 3 g/dL — ABNORMAL LOW (ref 3.5–5.0)
Alkaline Phosphatase: 106 U/L (ref 38–126)
Anion gap: 9 (ref 5–15)
BUN: 57 mg/dL — ABNORMAL HIGH (ref 6–20)
CO2: 31 mmol/L (ref 22–32)
Calcium: 8.6 mg/dL — ABNORMAL LOW (ref 8.9–10.3)
Chloride: 104 mmol/L (ref 98–111)
Creatinine, Ser: 1.81 mg/dL — ABNORMAL HIGH (ref 0.44–1.00)
GFR, Estimated: 32 mL/min — ABNORMAL LOW (ref >60)
Glucose, Bld: 109 mg/dL — ABNORMAL HIGH (ref 70–99)
Potassium: 4.7 mmol/L (ref 3.5–5.1)
Sodium: 144 mmol/L (ref 135–145)
Total Bilirubin: 0.5 mg/dL (ref 0.3–1.2)
Total Protein: 7.1 g/dL (ref 6.5–8.1)

## 2020-02-20 LAB — TROPONIN I (HIGH SENSITIVITY): Troponin I (High Sensitivity): 12 ng/L (ref <18)

## 2020-02-20 NOTE — Telephone Encounter (Signed)
48 hour follow up phone call after discharge.  Spoke with Nicole Schmidt, she states she is sleeping more, more SOB with activity.  She also notices when she is active, her 02 saturation drops.  She also states she feels to change the battery in the oximeter but is unable to get the back off.  She feels she is not doing as well as she was the day she was discharged.  Weighed herself yesterday but did not yet today.  She has a phone call into Dr. Fletcher Anon, but has not heard back.  She has also made her emergency contact aware incase she needs her.  She also said that she has a very long cord on the 02, suggested shortening it up if she could.  Instructed I would call her on Monday.  Also gave her my number here and would be her until 4:30 pm if she needed help.    Pricilla Riffle RN, CHFN

## 2020-02-20 NOTE — ED Notes (Signed)
Ice chips provided.

## 2020-02-20 NOTE — Telephone Encounter (Signed)
Pt c/o Shortness Of Breath: STAT if SOB developed within the last 24 hours or pt is noticeably SOB on the phone  1. Are you currently SOB (can you hear that pt is SOB on the phone)? No, is sitting right now   2. How long have you been experiencing SOB? Has been in the hospital for about 10 days and was sent home with oxygen but is still noticing it at home   3. Are you SOB when sitting or when up moving around? When moving around   4. Are you currently experiencing any other symptoms? Oxygen reading: 82 even with 2 liters of oxygen.  Patient would like to know if she should go back to the hospital or wait for an appointment sometime next week.  Please call to discuss.

## 2020-02-20 NOTE — ED Triage Notes (Addendum)
Pt states she was here for 10 days for resp failure. Pt states she is having a drop in h er oxygen sats when she walks into the 80s. Pt is chronically on 2lpm [place]. Pt is able to speak in full sentences and states when she is at rest she is fine. Pt states she believes she needs her "heart" checked.

## 2020-02-20 NOTE — Telephone Encounter (Addendum)
Spoke with the patient. Patient sts that her O2 sat drop in to the low 80's with ambulation, they are in 90's a rest. Adv her to double check pulse ox is working accurately, she will change the batteries. Adv her that after changing the batteries if her O2 sat are truly in the 80's she should turn her oxygen up to 6 liters and go to the ED for evaluation. If it is above 90% she can f/u with her appts next week as scheduled.  Adv the patient that her pulmonologist is the one managing her oxygen. Cardiology was not consulted during her recent hospitalization and O2 was initiated by pulmonology. She should contact her pulmonologist office for her oxygen management and recommendations in the future.  Patient verbalized understanding and voiced appreciation for the call back.

## 2020-02-21 ENCOUNTER — Inpatient Hospital Stay
Admission: EM | Admit: 2020-02-21 | Discharge: 2020-02-23 | DRG: 291 | Disposition: A | Discharge status: Home / Self Care | Payer: BC Managed Care – PPO | Attending: Internal Medicine | Admitting: Internal Medicine

## 2020-02-21 DIAGNOSIS — E785 Hyperlipidemia, unspecified: Secondary | ICD-10-CM | POA: Diagnosis present

## 2020-02-21 DIAGNOSIS — J9621 Acute and chronic respiratory failure with hypoxia: Secondary | ICD-10-CM | POA: Diagnosis present

## 2020-02-21 DIAGNOSIS — E119 Type 2 diabetes mellitus without complications: Secondary | ICD-10-CM | POA: Diagnosis present

## 2020-02-21 DIAGNOSIS — Z86711 Personal history of pulmonary embolism: Secondary | ICD-10-CM | POA: Diagnosis present

## 2020-02-21 DIAGNOSIS — I10 Essential (primary) hypertension: Secondary | ICD-10-CM | POA: Diagnosis present

## 2020-02-21 DIAGNOSIS — N183 Chronic kidney disease, stage 3 unspecified: Secondary | ICD-10-CM | POA: Diagnosis present

## 2020-02-21 DIAGNOSIS — Z923 Personal history of irradiation: Secondary | ICD-10-CM | POA: Diagnosis not present

## 2020-02-21 DIAGNOSIS — Z9989 Dependence on other enabling machines and devices: Secondary | ICD-10-CM | POA: Diagnosis not present

## 2020-02-21 DIAGNOSIS — G4733 Obstructive sleep apnea (adult) (pediatric): Secondary | ICD-10-CM | POA: Diagnosis present

## 2020-02-21 DIAGNOSIS — Z853 Personal history of malignant neoplasm of breast: Secondary | ICD-10-CM | POA: Diagnosis not present

## 2020-02-21 DIAGNOSIS — I5033 Acute on chronic diastolic (congestive) heart failure: Secondary | ICD-10-CM

## 2020-02-21 DIAGNOSIS — E1122 Type 2 diabetes mellitus with diabetic chronic kidney disease: Secondary | ICD-10-CM | POA: Diagnosis present

## 2020-02-21 DIAGNOSIS — E039 Hypothyroidism, unspecified: Secondary | ICD-10-CM | POA: Diagnosis present

## 2020-02-21 DIAGNOSIS — M109 Gout, unspecified: Secondary | ICD-10-CM | POA: Diagnosis present

## 2020-02-21 DIAGNOSIS — Z6841 Body Mass Index (BMI) 40.0 and over, adult: Secondary | ICD-10-CM | POA: Diagnosis not present

## 2020-02-21 DIAGNOSIS — D6859 Other primary thrombophilia: Secondary | ICD-10-CM | POA: Diagnosis present

## 2020-02-21 DIAGNOSIS — I482 Chronic atrial fibrillation, unspecified: Secondary | ICD-10-CM | POA: Diagnosis present

## 2020-02-21 DIAGNOSIS — I5032 Chronic diastolic (congestive) heart failure: Secondary | ICD-10-CM | POA: Diagnosis present

## 2020-02-21 DIAGNOSIS — D6869 Other thrombophilia: Secondary | ICD-10-CM | POA: Diagnosis not present

## 2020-02-21 DIAGNOSIS — I2699 Other pulmonary embolism without acute cor pulmonale: Secondary | ICD-10-CM | POA: Diagnosis present

## 2020-02-21 DIAGNOSIS — Z79899 Other long term (current) drug therapy: Secondary | ICD-10-CM | POA: Diagnosis not present

## 2020-02-21 DIAGNOSIS — E1169 Type 2 diabetes mellitus with other specified complication: Secondary | ICD-10-CM | POA: Diagnosis present

## 2020-02-21 DIAGNOSIS — I480 Paroxysmal atrial fibrillation: Secondary | ICD-10-CM | POA: Diagnosis not present

## 2020-02-21 DIAGNOSIS — Z9981 Dependence on supplemental oxygen: Secondary | ICD-10-CM | POA: Diagnosis not present

## 2020-02-21 DIAGNOSIS — Z7901 Long term (current) use of anticoagulants: Secondary | ICD-10-CM | POA: Diagnosis not present

## 2020-02-21 DIAGNOSIS — I4819 Other persistent atrial fibrillation: Secondary | ICD-10-CM | POA: Diagnosis present

## 2020-02-21 DIAGNOSIS — I130 Hypertensive heart and chronic kidney disease with heart failure and stage 1 through stage 4 chronic kidney disease, or unspecified chronic kidney disease: Secondary | ICD-10-CM | POA: Diagnosis present

## 2020-02-21 DIAGNOSIS — N1832 Chronic kidney disease, stage 3b: Secondary | ICD-10-CM | POA: Diagnosis present

## 2020-02-21 DIAGNOSIS — J9622 Acute and chronic respiratory failure with hypercapnia: Secondary | ICD-10-CM

## 2020-02-21 DIAGNOSIS — I509 Heart failure, unspecified: Secondary | ICD-10-CM

## 2020-02-21 DIAGNOSIS — Z20822 Contact with and (suspected) exposure to covid-19: Secondary | ICD-10-CM | POA: Diagnosis present

## 2020-02-21 DIAGNOSIS — Z79890 Hormone replacement therapy (postmenopausal): Secondary | ICD-10-CM | POA: Diagnosis not present

## 2020-02-21 LAB — RESP PANEL BY RT-PCR (FLU A&B, COVID) ARPGX2
Influenza A by PCR: NEGATIVE
Influenza B by PCR: NEGATIVE
SARS Coronavirus 2 by RT PCR: NEGATIVE

## 2020-02-21 LAB — BRAIN NATRIURETIC PEPTIDE: B Natriuretic Peptide: 210.7 pg/mL — ABNORMAL HIGH (ref 0.0–100.0)

## 2020-02-21 LAB — TROPONIN I (HIGH SENSITIVITY): Troponin I (High Sensitivity): 13 ng/L (ref <18)

## 2020-02-21 LAB — PROCALCITONIN: Procalcitonin: <0.1 ng/mL

## 2020-02-21 MED ORDER — SODIUM CHLORIDE 0.9 % IV SOLN: 250.0000 mL | INTRAVENOUS | Status: DC | PRN

## 2020-02-21 MED ORDER — METOPROLOL TARTRATE 50 MG PO TABS
50.0000 mg | ORAL_TABLET | Freq: Two times a day (BID) | ORAL | Status: DC
  Administered 2020-02-21 – 2020-02-23 (×4): 50 mg via ORAL
  Filled 2020-02-21 (×4): qty 1

## 2020-02-21 MED ORDER — CLONIDINE HCL 0.1 MG PO TABS
0.2000 mg | ORAL_TABLET | Freq: Two times a day (BID) | ORAL | Status: DC
  Administered 2020-02-21 – 2020-02-23 (×4): 0.2 mg via ORAL
  Filled 2020-02-21 (×4): qty 2

## 2020-02-21 MED ORDER — APIXABAN 5 MG PO TABS
5.0000 mg | ORAL_TABLET | Freq: Two times a day (BID) | ORAL | Status: DC
  Administered 2020-02-21 – 2020-02-23 (×5): 5 mg via ORAL
  Filled 2020-02-21 (×5): qty 1

## 2020-02-21 MED ORDER — LEVOTHYROXINE SODIUM 112 MCG PO TABS
112.0000 ug | ORAL_TABLET | Freq: Every day | ORAL | Status: DC
  Administered 2020-02-22 – 2020-02-23 (×2): 112 ug via ORAL
  Filled 2020-02-21 (×3): qty 1

## 2020-02-21 MED ORDER — FUROSEMIDE 10 MG/ML IJ SOLN
40.0000 mg | Freq: Once | INTRAMUSCULAR | Status: AC
  Administered 2020-02-21: 40 mg via INTRAVENOUS
  Filled 2020-02-21: qty 4

## 2020-02-21 MED ORDER — AMLODIPINE BESYLATE 10 MG PO TABS
10.0000 mg | ORAL_TABLET | Freq: Every day | ORAL | Status: DC
  Administered 2020-02-21 – 2020-02-23 (×3): 10 mg via ORAL
  Filled 2020-02-21: qty 1
  Filled 2020-02-21: qty 2
  Filled 2020-02-21: qty 1

## 2020-02-21 MED ORDER — SODIUM CHLORIDE 0.9% FLUSH
3.0000 mL | INTRAVENOUS | Status: DC | PRN
  Administered 2020-02-22 – 2020-02-23 (×3): 3 mL via INTRAVENOUS

## 2020-02-21 MED ORDER — ATORVASTATIN CALCIUM 20 MG PO TABS
20.0000 mg | ORAL_TABLET | Freq: Every day | ORAL | Status: DC
  Administered 2020-02-21 – 2020-02-22 (×2): 20 mg via ORAL
  Filled 2020-02-21 (×2): qty 1

## 2020-02-21 MED ORDER — IBUPROFEN 400 MG PO TABS
600.0000 mg | ORAL_TABLET | Freq: Four times a day (QID) | ORAL | Status: DC | PRN
  Administered 2020-02-21: 600 mg via ORAL
  Filled 2020-02-21: qty 1

## 2020-02-21 MED ORDER — MELATONIN 5 MG PO TABS
10.0000 mg | ORAL_TABLET | Freq: Every evening | ORAL | Status: DC | PRN
  Administered 2020-02-21 – 2020-02-23 (×2): 10 mg via ORAL
  Filled 2020-02-21 (×2): qty 2

## 2020-02-21 MED ORDER — SODIUM CHLORIDE 0.9% FLUSH
3.0000 mL | Freq: Two times a day (BID) | INTRAVENOUS | Status: DC
  Administered 2020-02-21 – 2020-02-22 (×3): 3 mL via INTRAVENOUS

## 2020-02-21 MED ORDER — FUROSEMIDE 10 MG/ML IJ SOLN
40.0000 mg | Freq: Every day | INTRAMUSCULAR | Status: DC
  Administered 2020-02-22: 40 mg via INTRAVENOUS
  Filled 2020-02-21: qty 4

## 2020-02-21 MED ORDER — POTASSIUM CHLORIDE CRYS ER 20 MEQ PO TBCR
20.0000 meq | EXTENDED_RELEASE_TABLET | Freq: Every day | ORAL | Status: DC
  Administered 2020-02-21 – 2020-02-23 (×3): 20 meq via ORAL
  Filled 2020-02-21 (×3): qty 1

## 2020-02-21 MED ORDER — APIXABAN 5 MG PO TABS: 5.0000 mg | ORAL_TABLET | Freq: Two times a day (BID) | ORAL | Status: DC

## 2020-02-21 NOTE — ED Notes (Signed)
Pt had 1 large void, unable to measure output

## 2020-02-21 NOTE — ED Notes (Signed)
Pt up to BR. Urine hood emptied of 575mls

## 2020-02-21 NOTE — ED Provider Notes (Addendum)
[facility] Emergency Department Provider Note  ____________________________________________  Time seen: Approximately 5:02 AM  I have reviewed the triage vital signs and the nursing notes.   HISTORY  Chief Complaint oxygen dropping when ambulating   HPI Nicole Schmidt is a 55 y.o. female with a history of congestive heart failure with preserved EF, PE on Eliquis, atrial fibrillation, hypertension, diabetes, CKD, OSA on CPAP, chronic respiratory failure on 2 L of oxygen who presents for evaluation of hypoxia.  Patient was admitted to the hospital for 10 days and discharged 2 days ago for CHF exacerbation.  Patient was discharged home on oxygen and instructed to return to the hospital if her oxygen dropped below 88%.  Patient reports that today her oxygen dropped to 80% with ambulation to and from her bedroom in the house.  She denies cough or wheezing, fever or chills, body aches, sore throat, vomiting or diarrhea, chest pain or changes in her chronic shortness of breath.  Patient reports that she only noticed that the oxygen was dropping because she had a pulse oximeter with her.  She was using 2 L of oxygen at that time.    Past Medical History:  Diagnosis Date  . (HFpEF) heart failure with preserved ejection fraction ([place])    a. 10/2018 Echo: EF 60-65%, diast dysfxn, RVSP 50.7 mmHg, mildly dil LA.  Marland Kitchen Acquired hypothyroidism 02/09/2020  . Anemia   . Arthritis   . Breast cancer ([place]) 2014   right breast cancer  . Breast cancer of upper-inner quadrant of right female breast ([place])    Right breast invasive CA and DCIS , 7 mm T1,N0,M0. Er/PR pos, her 2 negative.  Margins 1 mm.  . CHF (congestive heart failure) ([place])   . CKD (chronic kidney disease), stage III ([place])   . Diabetes mellitus type 2 in obese ([place]) 02/09/2020  . Diabetes mellitus without complication ([place])   . Dyslipidemia 02/09/2020  . Essential hypertension   . Gout   . Hypothyroidism   .  Menopause    age 37  . Morbid obesity ([place])   . Persistent atrial fibrillation ([place])    a. Dx 10/2018 in setting of PNA. CHA2DS2VASc = 4-->Eliquis; b. 11/2018 s/p successful DCCV.  Marland Kitchen Personal history of radiation therapy   . Pulmonary embolism ([place]) 10/2014   a. Chronic eliquis.  . Thyroid goiter     Patient Active Problem List   Diagnosis Date Noted  . Atrial fibrillation, chronic ([place]) 02/09/2020  . Dyslipidemia 02/09/2020  . Diabetes mellitus type 2 in obese ([place]) 02/09/2020  . Acquired hypothyroidism 02/09/2020  . Thrush, oral 02/09/2020  . Morbid obesity ([place]) 02/03/2020  . Chronic anticoagulation 12/30/2019  . OSA on CPAP 08/04/2019  . Atrial fibrillation with RVR ([place]) 10/18/2018  . Acute hypoxemic respiratory failure ([place]) 03/29/2018  . Chronic diastolic heart failure ([place]) 02/04/2018  . Lymphedema 02/04/2018  . Essential hypertension 12/09/2017  . Long term (current) use of aromatase inhibitors 11/17/2017  . Elevated alkaline phosphatase level 05/20/2017  . Goals of care, counseling/discussion 05/14/2017  . Chronic renal disease, stage III ([place]) 07/08/2015  . Iron deficiency anemia 07/08/2015  . Acute renal failure superimposed on stage 3 chronic kidney disease ([place]) 10/22/2014  . History of breast cancer 10/22/2014  . Elevated troponin 10/22/2014  . Pulmonary emboli ([place]) 10/18/2014  . Pulmonary embolism ([place]) 10/18/2014  . Candidiasis of breast 07/06/2014  . Malignant neoplasm of right female breast ([place]) 07/24/2012    Past Surgical  History:  Procedure Laterality Date  . BREAST BIOPSY Right 2014   breast ca  . BREAST EXCISIONAL BIOPSY Right 07/19/2012   breast ca  . BREAST SURGERY Right 2014   with sentinel node bx subareaolar duct excision  . CARDIOVERSION N/A 11/28/2018   Procedure: CARDIOVERSION;  Surgeon: Minna Merritts, MD;  Location: [facility] ORS;  Service: Cardiovascular;  Laterality: N/A;  . COLONOSCOPY WITH PROPOFOL N/A 01/30/2017   Procedure:  COLONOSCOPY WITH PROPOFOL;  Surgeon: Christene Lye, MD;  Location: [facility] ENDOSCOPY;  Service: Endoscopy;  Laterality: N/A;    Prior to Admission medications   Medication Sig Start Date End Date Taking? Authorizing Provider  acetaminophen (TYLENOL) 500 MG tablet Take 1,000 mg by mouth every 6 (six) hours as needed (for pain).    [provider]  amLODipine (NORVASC) 5 MG tablet Take 5 mg by mouth 2 (two) times daily. 09/18/18   [provider]  atorvastatin (LIPITOR) 20 MG tablet Take 20 mg by mouth at bedtime.     [provider]  calcium carbonate (OSCAL) 1500 (600 Ca) MG TABS tablet Take 600 mg of elemental calcium by mouth daily.     [provider]  Chlorpheniramine-Acetaminophen (CORICIDIN HBP COLD/FLU PO) Take 2 tablets by mouth 2 (two) times daily as needed.    [provider]  Cholecalciferol (VITAMIN D3) 50000 units TABS Take 50,000 Units by mouth every Friday.     [provider]  CINNAMON PO Take 1,000 mg by mouth daily.     [provider]  cloNIDine (CATAPRES) 0.2 MG tablet Take 1 tablet (0.2 mg total) by mouth 2 (two) times daily. 10/21/18   Mayo, Pete Pelt, MD  CRANBERRY CONCENTRATE PO Take 1 capsule by mouth daily.    [provider]  Dulaglutide 1.5 MG/0.5ML SOPN Inject 1.5 mg into the skin every Monday. In the evening    [provider]  ELIQUIS 5 MG TABS tablet TAKE 1 TABLET TWICE A DAY 12/23/19   Lequita Asal, MD  folic acid (FOLVITE) 643 MCG tablet Take 800 mcg by mouth daily.     [provider]  furosemide (LASIX) 40 MG tablet Take 1 tablet (40 mg total) by mouth daily. Take 1 tablet (20 mg) by mouth once daily 02/18/20   Jennye Boroughs, MD  KLOR-CON M20 20 MEQ tablet TAKE 1 TABLET DAILY 10/20/19   Alisa Graff, FNP  levothyroxine (SYNTHROID, LEVOTHROID) 112 MCG tablet Take 112 mcg daily before breakfast by mouth.    [provider]  Melatonin 10 MG TABS Take 10 mg by  mouth at bedtime.    [provider]  metoprolol tartrate (LOPRESSOR) 50 MG tablet TAKE 1 TABLET TWICE A DAY 08/04/19   Wellington Hampshire, MD  nystatin (MYCOSTATIN/NYSTOP) powder Apply 1 g topically 2 (two) times daily as needed (rash (skin folds)).    [provider]  vitamin C (ASCORBIC ACID) 500 MG tablet Take 500 mg by mouth 2 (two) times daily.    [provider]  fluticasone-salmeterol (ADVAIR HFA) 230-21 MCG/ACT inhaler Inhale 2 puffs into the lungs 2 (two) times daily as needed.  Patient not taking: Reported on 02/03/2020  02/09/20  [provider]    Allergies Patient has no known allergies.  Family History  Problem Relation Age of Onset  . Diabetes Father   . Hypertension Father   . Parkinson's disease Father   . Hypertension Mother   . Rheum arthritis Mother   .  Atrial fibrillation Mother   . Heart failure Mother   . Heart attack Mother        died in her mid-70's.  . Colon cancer Cousin 59  . Breast cancer Neg Hx     Social History Social History   Tobacco Use  . Smoking status: Never Smoker  . Smokeless tobacco: Never Used  Vaping Use  . Vaping Use: Never used  Substance Use Topics  . Alcohol use: No    Alcohol/week: 0.0 standard drinks  . Drug use: No    Review of Systems  Constitutional: Negative for fever. Eyes: Negative for visual changes. ENT: Negative for sore throat. Neck: No neck pain  Cardiovascular: Negative for chest pain. Respiratory: Negative for shortness of breath. Gastrointestinal: Negative for abdominal pain, vomiting or diarrhea. Genitourinary: Negative for dysuria. Musculoskeletal: Negative for back pain. Skin: Negative for rash. Neurological: Negative for headaches, weakness or numbness. Psych: No SI or HI  ____________________________________________   PHYSICAL EXAM:  VITAL SIGNS: Vitals:   02/21/20 0530 02/21/20 0600  BP: (!) 156/84 (!) 169/73  Pulse: 79 67  Resp: 15 (!) 23  Temp:     SpO2: 100% 98%    Constitutional: Alert and oriented. Well appearing and in no apparent distress. HEENT:      Head: Normocephalic and atraumatic.         Eyes: Conjunctivae are normal. Sclera is non-icteric.       Mouth/Throat: Mucous membranes are moist.       Neck: Supple with no signs of meningismus. Cardiovascular: Regular rate and rhythm. No murmurs, gallops, or rubs. 2+ symmetrical distal pulses are present in all extremities.  Respiratory: Normal respiratory effort. Lungs are clear to auscultation bilaterally. No wheezes, crackles, or rhonchi.  Gastrointestinal: Soft, non tender, and non distended. Musculoskeletal: 2+ pitting edema bilaterally. Neurologic: Normal speech and language. Face is symmetric. Moving all extremities. No gross focal neurologic deficits are appreciated. Skin: Skin is warm, dry and intact. No rash noted. Psychiatric: Mood and affect are normal. Speech and behavior are normal.  ____________________________________________   LABS (all labs ordered are listed, but only abnormal results are displayed)  Labs Reviewed  CBC - Abnormal; Notable for the following components:      Result Value   WBC 11.4 (*)    RBC 3.64 (*)    Hemoglobin 10.1 (*)    HCT 34.3 (*)    MCHC 29.4 (*)    All other components within normal limits  COMPREHENSIVE METABOLIC PANEL - Abnormal; Notable for the following components:   Glucose, Bld 109 (*)    BUN 57 (*)    Creatinine, Ser 1.81 (*)    Calcium 8.6 (*)    Albumin 3.0 (*)    GFR, Estimated 32 (*)    All other components within normal limits  BRAIN NATRIURETIC PEPTIDE - Abnormal; Notable for the following components:   B Natriuretic Peptide 210.7 (*)    All other components within normal limits  RESP PANEL BY RT-PCR (FLU A&B, COVID) ARPGX2  PROCALCITONIN  TROPONIN I (HIGH SENSITIVITY)  TROPONIN I (HIGH SENSITIVITY)   ____________________________________________  EKG  ED ECG REPORT I, Rudene Re, the  attending physician, personally viewed and interpreted this ECG.  Sinus bradycardia, rate of 56, normal intervals, normal axis, no ST elevations or depressions.  Otherwise normal EKG. ____________________________________________  RADIOLOGY  I have personally reviewed the images performed during this visit and I agree with the Radiologist's read.   Interpretation by Radiologist:  Darletta Moll  Chest 2 View  Result Date: 02/20/2020 CLINICAL DATA:  Shortness of breath.  Decreased oxygen when walking. EXAM: CHEST - 2 VIEW COMPARISON:  Radiograph 2 days ago.  Chest CT 02/09/2020 FINDINGS: Lower lung volumes from prior exam. Stable cardiomegaly. Unchanged mediastinal contours. Aortic atherosclerosis improving patchy and interstitial opacities, pulmonary edema versus multifocal pneumonia. Nodular opacities on prior CT are not well-defined by radiograph. No significant pleural fluid. No pneumothorax. Stable osseous structures. IMPRESSION: 1. Lower lung volumes. Improving patchy and interstitial opacities, pulmonary edema versus multifocal pneumonia. 2. Stable cardiomegaly. Electronically Signed   By: Keith Rake M.D.   On: 02/20/2020 22:51     ____________________________________________   PROCEDURES  Procedure(s) performed:yes .1-3 Lead EKG Interpretation Performed by: Rudene Re, MD Authorized by: Rudene Re, MD     Interpretation: non-specific     ECG rate assessment: bradycardic     Rhythm: sinus bradycardia     Ectopy: none     Critical Care performed: yes  CRITICAL CARE Performed by: Rudene Re  ?  Total critical care time: 40 min  Critical care time was exclusive of separately billable procedures and treating other patients.  Critical care was necessary to treat or prevent imminent or life-threatening deterioration.  Critical care was time spent personally by me on the following activities: development of treatment plan with patient and/or surrogate as well  as nursing, discussions with consultants, evaluation of patient's response to treatment, examination of patient, obtaining history from patient or surrogate, ordering and performing treatments and interventions, ordering and review of laboratory studies, ordering and review of radiographic studies, pulse oximetry and re-evaluation of patient's condition.  ____________________________________________   INITIAL IMPRESSION / ASSESSMENT AND PLAN / ED COURSE  55 y.o. female with a history of congestive heart failure with preserved EF, PE on Eliquis, atrial fibrillation, hypertension, diabetes, CKD, OSA on CPAP, chronic respiratory failure on 2 L of oxygen who presents for evaluation of hypoxia.  Patient with normal work of breathing, lungs are clear to auscultation good air movement, no wheezing or crackles, satting well on her 2 L of oxygen.  With ambulation patient did desat to 86%.  EKG showing no signs of ischemia or dysrhythmias.  Patient placed on telemetry for close monitoring.  Ddx CHF exacerbation, PNA, Covid, Flu. Low suspicion for PE since patient is on Eliquis, has not missed any doses, has no CP or SOB.  Chest x-ray visualized by me and is improving when compared to prior from her recent admission.  Patient here is afebrile, mild leukocytosis which is stable when compared to prior labs, chronic kidney disease with stable creatinine, negative troponin x1.  BNP of 210 which is slightly up when compared to BNP on her most recent admission of 197.  Procalcitonin is pending.  Repeat troponin is pending.  At this time low suspicion for infectious etiology.  Covid and flu swab is pending.  Will give a dose of IV Lasix and reassess.  Old medical records reviewed including echo done during her most recent admission and discharge summary.  _________________________ 6:47 AM on 02/21/2020 -----------------------------------------  Fluid Covid negative.  Procalcitonin negative.  Second troponin negative.   Patient with good urine output after 40 mg of IV Lasix however ambulation from the stretcher to the toilet drops her sats to the low 80s therefore will discuss with the hospitalist for admission.    _____________________________________________ Please note:  Patient was evaluated in Emergency Department today for the symptoms described in the history of present illness. Patient  was evaluated in the context of the global COVID-19 pandemic, which necessitated consideration that the patient might be at risk for infection with the SARS-CoV-2 virus that causes COVID-19. Institutional protocols and algorithms that pertain to the evaluation of patients at risk for COVID-19 are in a state of rapid change based on information released by regulatory bodies including the CDC and federal and state organizations. These policies and algorithms were followed during the patient's care in the ED.  Some ED evaluations and interventions may be delayed as a result of limited staffing during the pandemic.   [place] Controlled Substance Database was reviewed by me. ____________________________________________   FINAL CLINICAL IMPRESSION(S) / ED DIAGNOSES   Final diagnoses:  Acute on chronic respiratory failure with hypoxia (HCC)  Acute on chronic congestive heart failure, unspecified heart failure type ([place])      NEW MEDICATIONS STARTED DURING THIS VISIT:  ED Discharge Orders    None       Note:  This document was prepared using Dragon voice recognition software and may include unintentional dictation errors.    Alfred Levins, [place], MD 02/21/20 [place], [place], [place] 02/21/20 [phone]

## 2020-02-21 NOTE — H&P (Signed)
History and Physical    Nicole Schmidt QTM:[id number] DOB: 30-Apr-1965 DOA: 02/21/2020  PCP: Lenard Simmer, MD  Patient coming from: home   Chief Complaint: hypoxia  HPI: Nicole Schmidt is a 55 y.o. female with medical history significant for morbid obesity, hfpef, paroxysmal a fib on doac, dm, hypothyroid, breast cancer in remission, PE, ckd 3, osa on cpap, who presents with the above.  Recently admitted, discharged 1/5. Treated primarily for chf exacerbation, also for CAP. Was not on o2 prior but had persistent o2 requirement so discharged on 2 L.  Since discharge patient has noted that her oxygen dips to the low 80s with minimal ambulation. No shortness of breath at rest, no cough, no fevers, no chest pain/tightness, but does get short of breath with minimal exertion. Tolerating PO, no n/v/d. No hemoptysis. Compliant with eliquis and other meds. Does not think swelling has markedly increased, no worsening orthopnea. Non smoker.  ED Course:   Lasix 40, labs, cxr.  Review of Systems: As per HPI otherwise 10 point review of systems negative.    Past Medical History:  Diagnosis Date  . (HFpEF) heart failure with preserved ejection fraction ([place])    a. 10/2018 Echo: EF 60-65%, diast dysfxn, RVSP 50.7 mmHg, mildly dil LA.  Marland Kitchen Acquired hypothyroidism 02/09/2020  . Anemia   . Arthritis   . Breast cancer ([place]) 2014   right breast cancer  . Breast cancer of upper-inner quadrant of right female breast ([place])    Right breast invasive CA and DCIS , 7 mm T1,N0,M0. Er/PR pos, her 2 negative.  Margins 1 mm.  . CHF (congestive heart failure) ([place])   . CKD (chronic kidney disease), stage III ([place])   . Diabetes mellitus type 2 in obese ([place]) 02/09/2020  . Diabetes mellitus without complication ([place])   . Dyslipidemia 02/09/2020  . Essential hypertension   . Gout   . Hypothyroidism   . Menopause    age 73  . Morbid obesity ([place])   . Persistent atrial fibrillation ([place])    a. Dx 10/2018 in  setting of PNA. CHA2DS2VASc = 4-->Eliquis; b. 11/2018 s/p successful DCCV.  Marland Kitchen Personal history of radiation therapy   . Pulmonary embolism ([place]) 10/2014   a. Chronic eliquis.  . Thyroid goiter     Past Surgical History:  Procedure Laterality Date  . BREAST BIOPSY Right 2014   breast ca  . BREAST EXCISIONAL BIOPSY Right 07/19/2012   breast ca  . BREAST SURGERY Right 2014   with sentinel node bx subareaolar duct excision  . CARDIOVERSION N/A 11/28/2018   Procedure: CARDIOVERSION;  Surgeon: Minna Merritts, MD;  Location: [facility] ORS;  Service: Cardiovascular;  Laterality: N/A;  . COLONOSCOPY WITH PROPOFOL N/A 01/30/2017   Procedure: COLONOSCOPY WITH PROPOFOL;  Surgeon: Christene Lye, MD;  Location: [facility] ENDOSCOPY;  Service: Endoscopy;  Laterality: N/A;     reports that she has never smoked. She has never used smokeless tobacco. She reports that she does not drink alcohol and does not use drugs.  No Known Allergies  Family History  Problem Relation Age of Onset  . Diabetes Father   . Hypertension Father   . Parkinson's disease Father   . Hypertension Mother   . Rheum arthritis Mother   . Atrial fibrillation Mother   . Heart failure Mother   . Heart attack Mother        died in her mid-70's.  . Colon cancer Cousin 37  . Breast cancer  Neg Hx     Prior to Admission medications   Medication Sig Start Date End Date Taking? Authorizing Provider  acetaminophen (TYLENOL) 500 MG tablet Take 1,000 mg by mouth every 6 (six) hours as needed (for pain).    [provider]  amLODipine (NORVASC) 5 MG tablet Take 5 mg by mouth 2 (two) times daily. 09/18/18   [provider]  atorvastatin (LIPITOR) 20 MG tablet Take 20 mg by mouth at bedtime.     [provider]  calcium carbonate (OSCAL) 1500 (600 Ca) MG TABS tablet Take 600 mg of elemental calcium by mouth daily.     [provider]  Chlorpheniramine-Acetaminophen (CORICIDIN HBP COLD/FLU PO) Take  2 tablets by mouth 2 (two) times daily as needed.    [provider]  Cholecalciferol (VITAMIN D3) 50000 units TABS Take 50,000 Units by mouth every Friday.     [provider]  CINNAMON PO Take 1,000 mg by mouth daily.     [provider]  cloNIDine (CATAPRES) 0.2 MG tablet Take 1 tablet (0.2 mg total) by mouth 2 (two) times daily. 10/21/18   Mayo, Pete Pelt, MD  CRANBERRY CONCENTRATE PO Take 1 capsule by mouth daily.    [provider]  Dulaglutide 1.5 MG/0.5ML SOPN Inject 1.5 mg into the skin every Monday. In the evening    [provider]  ELIQUIS 5 MG TABS tablet TAKE 1 TABLET TWICE A DAY 12/23/19   Lequita Asal, MD  folic acid (FOLVITE) 671 MCG tablet Take 800 mcg by mouth daily.     [provider]  furosemide (LASIX) 40 MG tablet Take 1 tablet (40 mg total) by mouth daily. Take 1 tablet (20 mg) by mouth once daily 02/18/20   Jennye Boroughs, MD  KLOR-CON M20 20 MEQ tablet TAKE 1 TABLET DAILY 10/20/19   Alisa Graff, FNP  levothyroxine (SYNTHROID, LEVOTHROID) 112 MCG tablet Take 112 mcg daily before breakfast by mouth.    [provider]  Melatonin 10 MG TABS Take 10 mg by mouth at bedtime.    [provider]  metoprolol tartrate (LOPRESSOR) 50 MG tablet TAKE 1 TABLET TWICE A DAY 08/04/19   Wellington Hampshire, MD  nystatin (MYCOSTATIN/NYSTOP) powder Apply 1 g topically 2 (two) times daily as needed (rash (skin folds)).    [provider]  vitamin C (ASCORBIC ACID) 500 MG tablet Take 500 mg by mouth 2 (two) times daily.    [provider]  fluticasone-salmeterol (ADVAIR HFA) 230-21 MCG/ACT inhaler Inhale 2 puffs into the lungs 2 (two) times daily as needed.  Patient not taking: Reported on 02/03/2020  02/09/20  [provider]    Physical Exam: Vitals:   02/21/20 0930 02/21/20 0945 02/21/20 1030 02/21/20 1045  BP: (!) 154/67  (!) 162/62   Pulse: 65 69 (!) 55 63  Resp: 16 20 (!) 22 (!) 36   Temp:      TempSrc:      SpO2: 100% 97%  96%  Weight:      Height:        Constitutional: No acute distress Head: Atraumatic Eyes: Conjunctiva clear ENM: Moist mucous membranes. Normal dentition.  Neck: Supple Respiratory: Clear to auscultation bilaterally save for faint rales at bases Cardiovascular: Regular rate and rhythm. No murmurs/rubs/gallops. Abdomen: Non-tender, obese. No masses. No rebound or guarding. Positive bowel sounds. Musculoskeletal: No joint deformity upper and lower extremities. Normal ROM, no contractures. Normal muscle tone.  Skin: No rashes,  lesions, or ulcers.  Extremities: No peripheral edema. Palpable peripheral pulses. Neurologic: Alert, moving all 4 extremities. Psychiatric: Normal insight and judgement.   Labs on Admission: I have personally reviewed following labs and imaging studies  CBC: Recent Labs  Lab 02/15/20 0533 02/16/20 0541 02/17/20 0401 02/18/20 0334 02/20/20 2212  WBC 11.7* 13.3* 12.6* 12.0* 11.4*  HGB 10.0* 10.8* 10.6* 10.0* 10.1*  HCT 34.2* 36.3 36.0 33.7* 34.3*  MCV 94.2 93.3 93.3 92.6 94.2  PLT 207 237 242 235 185   Basic Metabolic Panel: Recent Labs  Lab 02/15/20 0533 02/16/20 0541 02/17/20 0401 02/18/20 0334 02/20/20 2212  NA 144 144 144 142 144  K 5.0 5.2* 4.8 4.6 4.7  CL 106 107 104 102 104  CO2 29 30 31 30 31   GLUCOSE 123* 117* 122* 130* 109*  BUN 48* 51* 58* 66* 57*  CREATININE 1.66* 1.72* 1.70* 2.00* 1.81*  CALCIUM 8.9 9.1 9.0 8.7* 8.6*  MG 2.3 2.3 2.3  --   --    GFR: Estimated Creatinine Clearance: 53.7 mL/min (A) (by C-G formula based on SCr of 1.81 mg/dL (H)). Liver Function Tests: Recent Labs  Lab 02/20/20 2212  AST 18  ALT 26  ALKPHOS 106  BILITOT 0.5  PROT 7.1  ALBUMIN 3.0*   No results for input(s): LIPASE, AMYLASE in the last 168 hours. No results for input(s): AMMONIA in the last 168 hours. Coagulation Profile: No results for input(s): INR, PROTIME in the last 168 hours. Cardiac  Enzymes: No results for input(s): CKTOTAL, CKMB, CKMBINDEX, TROPONINI in the last 168 hours. BNP (last 3 results) No results for input(s): PROBNP in the last 8760 hours. HbA1C: No results for input(s): HGBA1C in the last 72 hours. CBG: Recent Labs  Lab 02/17/20 0810 02/17/20 1137 02/17/20 1607 02/18/20 0912 02/18/20 1151  GLUCAP 104* 177* 114* 144* 150*   Lipid Profile: No results for input(s): CHOL, HDL, LDLCALC, TRIG, CHOLHDL, LDLDIRECT in the last 72 hours. Thyroid Function Tests: No results for input(s): TSH, T4TOTAL, FREET4, T3FREE, THYROIDAB in the last 72 hours. Anemia Panel: No results for input(s): VITAMINB12, FOLATE, FERRITIN, TIBC, IRON, RETICCTPCT in the last 72 hours. Urine analysis:    Component Value Date/Time   COLORURINE YELLOW (A) 11/15/2017 0529   APPEARANCEUR HAZY (A) 11/15/2017 0529   LABSPEC 1.009 11/15/2017 0529   PHURINE 6.0 11/15/2017 0529   GLUCOSEU 50 (A) 11/15/2017 0529   HGBUR NEGATIVE 11/15/2017 0529   BILIRUBINUR NEGATIVE 11/15/2017 0529   KETONESUR NEGATIVE 11/15/2017 0529   PROTEINUR 100 (A) 11/15/2017 0529   NITRITE NEGATIVE 11/15/2017 0529   LEUKOCYTESUR NEGATIVE 11/15/2017 0529    Radiological Exams on Admission: DG Chest 2 View  Result Date: 02/20/2020 CLINICAL DATA:  Shortness of breath.  Decreased oxygen when walking. EXAM: CHEST - 2 VIEW COMPARISON:  Radiograph 2 days ago.  Chest CT 02/09/2020 FINDINGS: Lower lung volumes from prior exam. Stable cardiomegaly. Unchanged mediastinal contours. Aortic atherosclerosis improving patchy and interstitial opacities, pulmonary edema versus multifocal pneumonia. Nodular opacities on prior CT are not well-defined by radiograph. No significant pleural fluid. No pneumothorax. Stable osseous structures. IMPRESSION: 1. Lower lung volumes. Improving patchy and interstitial opacities, pulmonary edema versus multifocal pneumonia. 2. Stable cardiomegaly. Electronically Signed   By: Keith Rake M.D.    On: 02/20/2020 22:51    EKG: Independently reviewed. Sinus brady  Assessment/Plan Principal Problem:   Acute on chronic respiratory failure with hypoxia (HCC) Active Problems:   Pulmonary embolism (HCC)   Chronic renal disease,  stage III [facility])   Essential hypertension   Chronic diastolic heart failure (HCC)   OSA on CPAP   Atrial fibrillation, chronic (HCC)   Diabetes mellitus type 2 in obese [facility])   Acquired hypothyroidism   Acute on chronic respiratory failure with hypoxemia (HCC)   # Acute hypoxic respiratory failure # HFpEF with exacerbation Here satting normally on new home O2 of 2L, hypoxic and symptomatic with ambulation. BNP is elevated to 200s, CXR shows improving patchy and interstitial opacities. TTE recent hospitalization showed diastolic dysfunction. CT that admission consistent w/ atypical infection. covid/flu negative. Low suspicion for PE given compliant with doac but does have hx of that. OSA and possible OHS likely contributory. trops neg x2, do not think acs. Good uop with 40 iv of lasix in the ED. Will treat now for fluid overload - continue lasix 40 qd - daily weights, strict I/o - consider CTA if fails to improve - holding on abx, no clinical signs of infection, procalcitonin low.  - daily potassium supplement  # Y3OO On trulicity at home - daily fasting sugars for now  # hypothyroid - home synthroid  # paroxysmal a fib - home eliquis, metoprolol  # Htn Here bp elevated to 160s - cont home amlodipine, clonidine, metoprolol, atorvastatin  # CKD3 Kidney function is at baseline - monitor  # OSA - cont home cpap, pt brought her home mask to use    DVT prophylaxis: eliquis Code Status: full  Family Communication: none @ bedside  Consults called: none    Status is: Inpatient  Remains inpatient appropriate because:Inpatient level of care appropriate due to severity of illness   Dispo: The patient is from: Home              Anticipated d/c  is to: Home              Anticipated d/c date is: 2 days              Patient currently is not medically stable to d/c.        Desma Maxim MD [facility] Pager [phone]  If 7PM-7AM, please contact night-coverage [email or web address] Password [email or web address]  02/21/2020, 11:29 AM

## 2020-02-21 NOTE — ED Notes (Signed)
Attending rounding at bedside °

## 2020-02-21 NOTE — Progress Notes (Signed)
ANTICOAGULATION CONSULT NOTE - Initial Consult  Pharmacy Consult for apixaban Indication: atrial fibrillation  No Known Allergies  Patient Measurements: Height: 5\' 4"  (162.6 cm) Weight: (!) 163.3 kg (360 lb) IBW/kg (Calculated) : 54.7  Vital Signs: Temp: 98.2 F (36.8 C) (01/08 0410) Temp Source: Oral (01/08 0410) BP: 162/62 (01/08 1030) Pulse Rate: 63 (01/08 1045)  Labs: Recent Labs    02/20/20 2212 02/21/20 0520  HGB 10.1*  --   HCT 34.3*  --   PLT 229  --   CREATININE 1.81*  --   TROPONINIHS 12 13    Estimated Creatinine Clearance: 53.7 mL/min (A) (by C-G formula based on SCr of 1.81 mg/dL (H)).   Medical History: Past Medical History:  Diagnosis Date  . (HFpEF) heart failure with preserved ejection fraction ([place])    a. 10/2018 Echo: EF 60-65%, diast dysfxn, RVSP 50.7 mmHg, mildly dil LA.  Marland Kitchen Acquired hypothyroidism 02/09/2020  . Anemia   . Arthritis   . Breast cancer ([place]) 2014   right breast cancer  . Breast cancer of upper-inner quadrant of right female breast ([place])    Right breast invasive CA and DCIS , 7 mm T1,N0,M0. Er/PR pos, her 2 negative.  Margins 1 mm.  . CHF (congestive heart failure) ([place])   . CKD (chronic kidney disease), stage III ([place])   . Diabetes mellitus type 2 in obese ([place]) 02/09/2020  . Diabetes mellitus without complication ([place])   . Dyslipidemia 02/09/2020  . Essential hypertension   . Gout   . Hypothyroidism   . Menopause    age 55  . Morbid obesity ([place])   . Persistent atrial fibrillation ([place])    a. Dx 10/2018 in setting of PNA. CHA2DS2VASc = 4-->Eliquis; b. 11/2018 s/p successful DCCV.  Marland Kitchen Personal history of radiation therapy   . Pulmonary embolism ([place]) 10/2014   a. Chronic eliquis.  . Thyroid goiter     Assessment: 55 yo female on apixaban 5 mg PO BID for AFib PTA  Goal of Therapy:  Monitor platelets by anticoagulation protocol: Yes   Plan:  Apixaban 5 mg PO BID SCr and CBC at least every three days   Rocky Morel 02/21/2020,12:04 PM

## 2020-02-22 ENCOUNTER — Other Ambulatory Visit: Payer: Self-pay

## 2020-02-22 ENCOUNTER — Encounter: Payer: Self-pay | Admitting: Obstetrics and Gynecology

## 2020-02-22 DIAGNOSIS — I5033 Acute on chronic diastolic (congestive) heart failure: Secondary | ICD-10-CM

## 2020-02-22 DIAGNOSIS — N1832 Chronic kidney disease, stage 3b: Secondary | ICD-10-CM

## 2020-02-22 DIAGNOSIS — I480 Paroxysmal atrial fibrillation: Secondary | ICD-10-CM | POA: Diagnosis not present

## 2020-02-22 DIAGNOSIS — G4733 Obstructive sleep apnea (adult) (pediatric): Secondary | ICD-10-CM

## 2020-02-22 DIAGNOSIS — D6869 Other thrombophilia: Secondary | ICD-10-CM

## 2020-02-22 DIAGNOSIS — I10 Essential (primary) hypertension: Secondary | ICD-10-CM

## 2020-02-22 DIAGNOSIS — J9621 Acute and chronic respiratory failure with hypoxia: Secondary | ICD-10-CM | POA: Diagnosis not present

## 2020-02-22 DIAGNOSIS — Z9989 Dependence on other enabling machines and devices: Secondary | ICD-10-CM

## 2020-02-22 LAB — CBC
HCT: 33.1 % — ABNORMAL LOW (ref 36.0–46.0)
Hemoglobin: 9.9 g/dL — ABNORMAL LOW (ref 12.0–15.0)
MCH: 27.8 pg (ref 26.0–34.0)
MCHC: 29.9 g/dL — ABNORMAL LOW (ref 30.0–36.0)
MCV: 93 fL (ref 80.0–100.0)
Platelets: 213 10*3/uL (ref 150–400)
RBC: 3.56 MIL/uL — ABNORMAL LOW (ref 3.87–5.11)
RDW: 13.9 % (ref 11.5–15.5)
WBC: 11.4 10*3/uL — ABNORMAL HIGH (ref 4.0–10.5)
nRBC: 0 % (ref 0.0–0.2)

## 2020-02-22 LAB — BASIC METABOLIC PANEL
Anion gap: 9 (ref 5–15)
BUN: 53 mg/dL — ABNORMAL HIGH (ref 6–20)
CO2: 32 mmol/L (ref 22–32)
Calcium: 8.5 mg/dL — ABNORMAL LOW (ref 8.9–10.3)
Chloride: 102 mmol/L (ref 98–111)
Creatinine, Ser: 1.78 mg/dL — ABNORMAL HIGH (ref 0.44–1.00)
GFR, Estimated: 33 mL/min — ABNORMAL LOW (ref >60)
Glucose, Bld: 131 mg/dL — ABNORMAL HIGH (ref 70–99)
Potassium: 4.6 mmol/L (ref 3.5–5.1)
Sodium: 143 mmol/L (ref 135–145)

## 2020-02-22 MED ORDER — FUROSEMIDE 40 MG PO TABS
40.0000 mg | ORAL_TABLET | Freq: Two times a day (BID) | ORAL | Status: DC
  Administered 2020-02-22: 40 mg via ORAL
  Filled 2020-02-22: qty 1

## 2020-02-22 NOTE — Progress Notes (Signed)
Patient ID: Nicole Schmidt, female   DOB: [birth year], 55 y.o.   MRN: [id number] [facility] PROGRESS NOTE  NOELA BROTHERS EXB:[id number] DOB: 06-22-1965 DOA: 02/21/2020 PCP: Lenard Simmer, MD  HPI/Subjective: Patient came back to the hospital with shortness of breath with walking and dropping her oxygen saturations in the low 80s.  Today with walking she did not drop down as low but did get winded and had to stop a few times while walking around the nursing station.  Recent hospitalization for heart failure and pneumonia.  Objective: Vitals:   02/22/20 1207 02/22/20 1344  BP: (!) 149/84   Pulse: 81   Resp: 18   Temp: 97.9 F (36.6 C)   SpO2: 96% 96%    Intake/Output Summary (Last 24 hours) at 02/22/2020 1440 Last data filed at 02/22/2020 1413 Gross per 24 hour  Intake 720 ml  Output --  Net 720 ml   Filed Weights   02/20/20 2206 02/21/20 2000 02/22/20 0500  Weight: (!) 163.3 kg (!) 164 kg (!) 164 kg    ROS: Review of Systems  Respiratory: Positive for shortness of breath. Negative for cough.   Cardiovascular: Negative for chest pain.  Gastrointestinal: Negative for abdominal pain and vomiting.   Exam: Physical Exam HENT:     Head: Normocephalic.     Mouth/Throat:     Pharynx: No oropharyngeal exudate.  Eyes:     General: Lids are normal.     Conjunctiva/sclera: Conjunctivae normal.     Pupils: Pupils are equal, round, and reactive to light.  Cardiovascular:     Rate and Rhythm: Normal rate and regular rhythm.     Heart sounds: Normal heart sounds, S1 normal and S2 normal.  Pulmonary:     Breath sounds: Examination of the right-lower field reveals decreased breath sounds. Examination of the left-lower field reveals decreased breath sounds. Decreased breath sounds present. No wheezing, rhonchi or rales.  Abdominal:     Palpations: Abdomen is soft.     Tenderness: There is no abdominal tenderness.  Musculoskeletal:     Right lower leg: Swelling present.     Left  lower leg: Swelling present.  Skin:    General: Skin is warm.     Findings: No rash.  Neurological:     Mental Status: She is alert and oriented to person, place, and time.       Data Reviewed: Basic Metabolic Panel: Recent Labs  Lab 02/16/20 0541 02/17/20 0401 02/18/20 0334 02/20/20 2212 02/22/20 0547  NA 144 144 142 144 143  K 5.2* 4.8 4.6 4.7 4.6  CL 107 104 102 104 102  CO2 30 31 30 31  32  GLUCOSE 117* 122* 130* 109* 131*  BUN 51* 58* 66* 57* 53*  CREATININE 1.72* 1.70* 2.00* 1.81* 1.78*  CALCIUM 9.1 9.0 8.7* 8.6* 8.5*  MG 2.3 2.3  --   --   --    Liver Function Tests: Recent Labs  Lab 02/20/20 2212  AST 18  ALT 26  ALKPHOS 106  BILITOT 0.5  PROT 7.1  ALBUMIN 3.0*   CBC: Recent Labs  Lab 02/16/20 0541 02/17/20 0401 02/18/20 0334 02/20/20 2212 02/22/20 0547  WBC 13.3* 12.6* 12.0* 11.4* 11.4*  HGB 10.8* 10.6* 10.0* 10.1* 9.9*  HCT 36.3 36.0 33.7* 34.3* 33.1*  MCV 93.3 93.3 92.6 94.2 93.0  PLT 237 242 235 229 213  BNP (last 3 results) Recent Labs    02/09/20 0200 02/20/20 2212  BNP 197.9* 210.7*  CBG: Recent Labs  Lab 02/17/20 0810 02/17/20 1137 02/17/20 1607 02/18/20 0912 02/18/20 1151  GLUCAP 104* 177* 114* 144* 150*    Recent Results (from the past 240 hour(s))  Resp Panel by RT-PCR (Flu A&B, Covid) Nasopharyngeal Swab     Status: None   Collection Time: 02/21/20  4:45 AM   Specimen: Nasopharyngeal Swab; Nasopharyngeal(NP) swabs in vial transport medium  Result Value Ref Range Status   SARS Coronavirus 2 by RT PCR NEGATIVE NEGATIVE Final    Comment: (NOTE) SARS-CoV-2 target nucleic acids are NOT DETECTED.  The SARS-CoV-2 RNA is generally detectable in upper respiratory specimens during the acute phase of infection. The lowest concentration of SARS-CoV-2 viral copies this assay can detect is 138 copies/mL. A negative result does not preclude SARS-Cov-2 infection and should not be used as the sole basis for treatment or other  patient management decisions. A negative result may occur with  improper specimen collection/handling, submission of specimen other than nasopharyngeal swab, presence of viral mutation(s) within the areas targeted by this assay, and inadequate number of viral copies(<138 copies/mL). A negative result must be combined with clinical observations, patient history, and epidemiological information. The expected result is Negative.  Fact Sheet for Patients:  EntrepreneurPulse.com.au  Fact Sheet for Healthcare Providers:  IncredibleEmployment.be  This test is no t yet approved or cleared by the [place] FDA and  has been authorized for detection and/or diagnosis of SARS-CoV-2 by FDA under an Emergency Use Authorization (EUA). This EUA will remain  in effect (meaning this test can be used) for the duration of the COVID-19 declaration under Section 564(b)(1) of the Act, 21 U.S.C.section 360bbb-3(b)(1), unless the authorization is terminated  or revoked sooner.       Influenza A by PCR NEGATIVE NEGATIVE Final   Influenza B by PCR NEGATIVE NEGATIVE Final    Comment: (NOTE) The Xpert Xpress SARS-CoV-2/FLU/RSV plus assay is intended as an aid in the diagnosis of influenza from Nasopharyngeal swab specimens and should not be used as a sole basis for treatment. Nasal washings and aspirates are unacceptable for Xpert Xpress SARS-CoV-2/FLU/RSV testing.  Fact Sheet for Patients: EntrepreneurPulse.com.au  Fact Sheet for Healthcare Providers: IncredibleEmployment.be  This test is not yet approved or cleared by the [place] FDA and has been authorized for detection and/or diagnosis of SARS-CoV-2 by FDA under an Emergency Use Authorization (EUA). This EUA will remain in effect (meaning this test can be used) for the duration of the COVID-19 declaration under Section 564(b)(1) of the Act, 21 U.S.C. section  360bbb-3(b)(1), unless the authorization is terminated or revoked.  Performed at [facility], [address] [place], [place] [address]      Studies: DG Chest 2 View  Result Date: 02/20/2020 CLINICAL DATA:  Shortness of breath.  Decreased oxygen when walking. EXAM: CHEST - 2 VIEW COMPARISON:  Radiograph 2 days ago.  Chest CT 02/09/2020 FINDINGS: Lower lung volumes from prior exam. Stable cardiomegaly. Unchanged mediastinal contours. Aortic atherosclerosis improving patchy and interstitial opacities, pulmonary edema versus multifocal pneumonia. Nodular opacities on prior CT are not well-defined by radiograph. No significant pleural fluid. No pneumothorax. Stable osseous structures. IMPRESSION: 1. Lower lung volumes. Improving patchy and interstitial opacities, pulmonary edema versus multifocal pneumonia. 2. Stable cardiomegaly. Electronically Signed   By: Keith Rake M.D.   On: 02/20/2020 22:51    Scheduled Meds: . amLODipine  10 mg Oral Daily  . apixaban  5 mg Oral BID  . atorvastatin  20 mg Oral QHS  .  cloNIDine  0.2 mg Oral BID  . furosemide  40 mg Intravenous Daily  . levothyroxine  112 mcg Oral QAC breakfast  . metoprolol tartrate  50 mg Oral BID  . potassium chloride SA  20 mEq Oral Daily  . sodium chloride flush  3 mL Intravenous Q12H   Continuous Infusions: . sodium chloride      Assessment/Plan:  1. Acute on chronic hypoxic respiratory failure.  Recently diagnosed with chronic respiratory failure and sent home on 2 L of oxygen.  She has had shortness of breath with walking and her pulse ox dropped down in the low 80s with ambulation at home.  Today with ambulation pulse ox dropped down to 89% on 2 L. 2. Acute on chronic diastolic congestive heart failure patient given Lasix IV yesterday and today.  Will change over to oral twice daily dosing. 3. Essential hypertension on Norvasc, clonidine, Lasix. 4. Paroxysmal atrial fibrillation on Eliquis for  anticoagulation and metoprolol for rate control. 5. 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  Eliquis to reduce stroke risk. 6. Chronic kidney disease stage IIIb continue to watch creatinine closely with diuresis.  Change over to oral diuresis. 7. Obstructive sleep apnea on CPAP. 8. Morbid obesity with a BMI listed at 62.06        Code Status:     Code Status Orders  (From admission, onward)         Start     Ordered   02/21/20 1240  Full code  Continuous        02/21/20 1239        Code Status History    Date Active Date Inactive Code Status Order ID Comments User Context   02/09/2020 1006 02/18/2020 2306 Full Code [id number]  Karmen Bongo, MD ED   10/18/2018 0729 10/21/2018 2022 Full Code [id number]  Harrie Foreman, MD ED   03/29/2018 2320 04/03/2018 1616 Full Code [id number]  Arta Silence, MD Inpatient   11/15/2017 2055 11/21/2017 1459 Full Code [id number]  Gorden Harms, MD Inpatient   10/18/2014 2221 10/23/2014 2043 Full Code [id number]  Jacqulynn Cadet, MD Inpatient   10/18/2014 2119 10/18/2014 2221 Full Code [id number]  Guy Begin, MD Inpatient   Advance Care Planning Activity    Advance Directive Documentation   Flowsheet Row Most Recent Value  Type of Advance Directive Healthcare Power of Attorney  Pre-existing out of facility DNR order (yellow form or pink MOST form) --  "MOST" Form in Place? --     Disposition Plan: Status is: Inpatient  Dispo: The patient is from: Home              Anticipated d/c is to: Home              Anticipated d/c date is: 02/23/2020              Patient currently receiving diuresis for acute on chronic hypoxic respiratory failure.  We will switch over to oral Lasix for this evening and reevaluate in a.m. for disposition home.  Time spent: 28 minutes  [place]

## 2020-02-23 DIAGNOSIS — M6281 Muscle weakness (generalized): Secondary | ICD-10-CM | POA: Diagnosis not present

## 2020-02-23 DIAGNOSIS — R262 Difficulty in walking, not elsewhere classified: Secondary | ICD-10-CM | POA: Diagnosis not present

## 2020-02-23 DIAGNOSIS — J9621 Acute and chronic respiratory failure with hypoxia: Secondary | ICD-10-CM | POA: Diagnosis not present

## 2020-02-23 DIAGNOSIS — I480 Paroxysmal atrial fibrillation: Secondary | ICD-10-CM | POA: Diagnosis not present

## 2020-02-23 DIAGNOSIS — I10 Essential (primary) hypertension: Secondary | ICD-10-CM | POA: Diagnosis not present

## 2020-02-23 DIAGNOSIS — I5033 Acute on chronic diastolic (congestive) heart failure: Secondary | ICD-10-CM | POA: Diagnosis not present

## 2020-02-23 DIAGNOSIS — Z6841 Body Mass Index (BMI) 40.0 and over, adult: Secondary | ICD-10-CM

## 2020-02-23 LAB — BASIC METABOLIC PANEL
Anion gap: 13 (ref 5–15)
BUN: 60 mg/dL — ABNORMAL HIGH (ref 6–20)
CO2: 31 mmol/L (ref 22–32)
Calcium: 8.4 mg/dL — ABNORMAL LOW (ref 8.9–10.3)
Chloride: 99 mmol/L (ref 98–111)
Creatinine, Ser: 2.03 mg/dL — ABNORMAL HIGH (ref 0.44–1.00)
GFR, Estimated: 28 mL/min — ABNORMAL LOW (ref >60)
Glucose, Bld: 129 mg/dL — ABNORMAL HIGH (ref 70–99)
Potassium: 4.9 mmol/L (ref 3.5–5.1)
Sodium: 143 mmol/L (ref 135–145)

## 2020-02-23 MED ORDER — FUROSEMIDE 40 MG PO TABS: 40.0000 mg | ORAL_TABLET | Freq: Every day | ORAL | Status: DC

## 2020-02-23 NOTE — Progress Notes (Signed)
Assisted pt to ambulate X 1 lap around nursing station and back to her room w/O2 2LPM.. Initial O2 sat, sitting up in recliner, w/2 LPM [place] was 99%. Pt desatted to 88% while walking; recovered to 96-100% after brief rest period and when at rest back in recliner chair in her room. Skin color remained wdl during activity;no c/o pain or other discomfort.

## 2020-02-23 NOTE — Discharge Summary (Signed)
[facility] NAME: Nicole Schmidt    MR#:  [id number]  DATE OF BIRTH:  09-Sep-1965  DATE OF ADMISSION:  02/21/2020 ADMITTING PHYSICIAN: Gwynne Edinger, MD  DATE OF DISCHARGE: 02/23/2020 12:41 PM  PRIMARY CARE PHYSICIAN: Lenard Simmer, MD    ADMISSION DIAGNOSIS:  Acute on chronic respiratory failure with hypoxia (HCC) [J96.21] Acute on chronic respiratory failure with hypoxemia (HCC) [J96.21] Acute on chronic congestive heart failure, unspecified heart failure type ([place]) [I50.9]  DISCHARGE DIAGNOSIS:  Principal Problem:   Acute on chronic respiratory failure with hypoxia (HCC) Active Problems:   Pulmonary embolism (HCC)   Chronic renal disease, stage III (HCC)   Essential hypertension   Acute on chronic diastolic CHF (congestive heart failure) (HCC)   OSA on CPAP   Atrial fibrillation, chronic (HCC)   Diabetes mellitus type 2 in obese ([place])   Acquired hypothyroidism   Acute on chronic respiratory failure with hypoxemia (HCC)   AF (paroxysmal atrial fibrillation) ([place])   Acquired thrombophilia ([place])   SECONDARY DIAGNOSIS:   Past Medical History:  Diagnosis Date  . (HFpEF) heart failure with preserved ejection fraction ([place])    a. 10/2018 Echo: EF 60-65%, diast dysfxn, RVSP 50.7 mmHg, mildly dil LA.  Marland Kitchen Acquired hypothyroidism 02/09/2020  . Anemia   . Arthritis   . Breast cancer ([place]) 2014   right breast cancer  . Breast cancer of upper-inner quadrant of right female breast ([place])    Right breast invasive CA and DCIS , 7 mm T1,N0,M0. Er/PR pos, her 2 negative.  Margins 1 mm.  . CHF (congestive heart failure) ([place])   . CKD (chronic kidney disease), stage III ([place])   . Diabetes mellitus type 2 in obese ([place]) 02/09/2020  . Diabetes mellitus without complication ([place])   . Dyslipidemia 02/09/2020  . Essential hypertension   . Gout   . Hypothyroidism   . Menopause    age 35  . Morbid obesity ([place])   . Persistent atrial  fibrillation ([place])    a. Dx 10/2018 in setting of PNA. CHA2DS2VASc = 4-->Eliquis; b. 11/2018 s/p successful DCCV.  Marland Kitchen Personal history of radiation therapy   . Pulmonary embolism ([place]) 10/2014   a. Chronic eliquis.  . Thyroid goiter     HOSPITAL COURSE:   1.  Acute on chronic hypoxic respiratory failure.  The patient was recently diagnosed with chronic respiratory failure and sent home on 2 L of oxygen.  She stated at home she dropped her oxygen saturations into the low 80s with ambulation.  With ambulation she did come down to 89% on the day of discharge.  She can go up to 3 or 4 L with ambulation if continues to drop down. 2.  Acute on chronic diastolic congestive heart failure.  The patient was given IV Lasix for 2 days and switched over to oral for twice a day dosing.  The patient wants to go home on once a day dosing.  If she is short of breath, gains 3 pounds in 1 day I advised her to take an extra half pill in the afternoon.  She has a follow-up at the CHF clinic tomorrow.  On metoprolol 3.  Essential hypertension on Norvasc, metoprolol, clonidine and Lasix 4.  Paroxysmal atrial fibrillation.  On Eliquis for anticoagulation.  Metoprolol for rate control. 5.  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  Eliquis to reduce stroke risk. 6.  Chronic kidney disease stage IIIb.  Continue  to watch creatinine closely with diuresis.  Continue to watch as outpatient and recommend checking BMP with follow-up appointments. 7.  Obstructive sleep apnea on CPAP at night 8.  Morbid obesity with a BMI of 62.06.  Recommend daily weights. 9.  Hypothyroidism unspecified on levothyroxine 10.  Type 2 diabetes mellitus.  Hemoglobin A1c actually lower than the threshold for this diagnosis at 6.4.  Diet controlled.  DISCHARGE CONDITIONS:   Satisfactory  CONSULTS OBTAINED:  None  DRUG ALLERGIES:  No Known Allergies  DISCHARGE MEDICATIONS:   Allergies as of 02/23/2020   No Known Allergies      Medication List    STOP taking these medications   CORICIDIN HBP COLD/FLU PO     TAKE these medications   acetaminophen 500 MG tablet Commonly known as: TYLENOL Take 1,000 mg by mouth every 6 (six) hours as needed (for pain).   amLODipine 5 MG tablet Commonly known as: NORVASC Take 5 mg by mouth 2 (two) times daily.   atorvastatin 20 MG tablet Commonly known as: LIPITOR Take 20 mg by mouth at bedtime.   calcium carbonate 1500 (600 Ca) MG Tabs tablet Commonly known as: OSCAL Take 600 mg of elemental calcium by mouth daily.   CINNAMON PO Take 1,000 mg by mouth daily.   cloNIDine 0.2 MG tablet Commonly known as: CATAPRES Take 1 tablet (0.2 mg total) by mouth 2 (two) times daily.   CRANBERRY CONCENTRATE PO Take 1 capsule by mouth daily.   Dulaglutide 1.5 MG/0.5ML Sopn Inject 1.5 mg into the skin every 7 (seven) days.   Eliquis 5 MG Tabs tablet Generic drug: apixaban TAKE 1 TABLET TWICE A DAY   folic acid 294 MCG tablet Commonly known as: FOLVITE Take 800 mcg by mouth daily.   furosemide 40 MG tablet Commonly known as: LASIX Take 1 tablet (40 mg total) by mouth daily.   Klor-Con M20 20 MEQ tablet Generic drug: potassium chloride SA TAKE 1 TABLET DAILY   levothyroxine 112 MCG tablet Commonly known as: SYNTHROID Take 112 mcg daily before breakfast by mouth.   Melatonin 10 MG Tabs Take 10 mg by mouth at bedtime.   metoprolol tartrate 50 MG tablet Commonly known as: LOPRESSOR TAKE 1 TABLET TWICE A DAY   vitamin C 500 MG tablet Commonly known as: ASCORBIC ACID Take 500 mg by mouth 2 (two) times daily.   Vitamin D3 1.25 MG (50000 UT) Tabs Take 50,000 Units by mouth every Friday.        DISCHARGE INSTRUCTIONS:   Follow-up PMD 5 days Has appointment with CHF clinic tomorrow Follow-up Dr. Fletcher Anon cardiology  If you experience worsening of your admission symptoms, develop shortness of breath, life threatening emergency, suicidal or homicidal thoughts  you must seek medical attention immediately by calling 911 or calling your MD immediately  if symptoms less severe.  You Must read complete instructions/literature along with all the possible adverse reactions/side effects for all the Medicines you take and that have been prescribed to you. Take any new Medicines after you have completely understood and accept all the possible adverse reactions/side effects.   Please note  You were cared for by a hospitalist during your hospital stay. If you have any questions about your discharge medications or the care you received while you were in the hospital after you are discharged, you can call the unit and asked to speak with the hospitalist on call if the hospitalist that took care of you is not available. Once you are discharged,  your primary care physician will handle any further medical issues. Please note that NO REFILLS for any discharge medications will be authorized once you are discharged, as it is imperative that you return to your primary care physician (or establish a relationship with a primary care physician if you do not have one) for your aftercare needs so that they can reassess your need for medications and monitor your lab values.    Today   CHIEF COMPLAINT:   Chief Complaint  Patient presents with  . oxygen dropping when ambulating    HISTORY OF PRESENT ILLNESS:  Kolina Kube  is a 55 y.o. female came in with pulse ox dropping down to the low 80s with ambulation.   VITAL SIGNS:  Blood pressure (!) 156/71, pulse 61, temperature 97.9 F (36.6 C), resp. rate 19, height 5\' 4"  (1.626 m), weight (!) 164 kg, last menstrual period 07/17/2012, SpO2 96 %.   PHYSICAL EXAMINATION:  GENERAL:  55 y.o.-year-old patient lying in the bed with no acute distress.  EYES: Pupils equal, round, reactive to light and accommodation. No scleral icterus. HEENT: Head atraumatic, normocephalic. Oropharynx and nasopharynx clear.  LUNGS: Normal breath  sounds bilaterally, no wheezing, rales,rhonchi or crepitation. No use of accessory muscles of respiration.  CARDIOVASCULAR: S1, S2 normal. No murmurs, rubs, or gallops.  ABDOMEN: Soft, non-tender, non-distended.  EXTREMITIES: No pedal edema.  NEUROLOGIC: Cranial nerves II through XII are intact. Muscle strength 5/5 in all extremities. Gait not checked.  PSYCHIATRIC: The patient is alert.  SKIN: No obvious rash, lesion, or ulcer.   DATA REVIEW:   CBC Recent Labs  Lab 02/22/20 0547  WBC 11.4*  HGB 9.9*  HCT 33.1*  PLT 213    Chemistries  Recent Labs  Lab 02/17/20 0401 02/18/20 0334 02/20/20 2212 02/22/20 0547 02/23/20 0504  NA 144   < > 144   < > 143  K 4.8   < > 4.7   < > 4.9  CL 104   < > 104   < > 99  CO2 31   < > 31   < > 31  GLUCOSE 122*   < > 109*   < > 129*  BUN 58*   < > 57*   < > 60*  CREATININE 1.70*   < > 1.81*   < > 2.03*  CALCIUM 9.0   < > 8.6*   < > 8.4*  MG 2.3  --   --   --   --   AST  --   --  18  --   --   ALT  --   --  26  --   --   ALKPHOS  --   --  106  --   --   BILITOT  --   --  0.5  --   --    < > = values in this interval not displayed.    Microbiology Results  Results for orders placed or performed during the hospital encounter of 02/21/20  Resp Panel by RT-PCR (Flu A&B, Covid) Nasopharyngeal Swab     Status: None   Collection Time: 02/21/20  4:45 AM   Specimen: Nasopharyngeal Swab; Nasopharyngeal(NP) swabs in vial transport medium  Result Value Ref Range Status   SARS Coronavirus 2 by RT PCR NEGATIVE NEGATIVE Final    Comment: (NOTE) SARS-CoV-2 target nucleic acids are NOT DETECTED.  The SARS-CoV-2 RNA is generally detectable in upper respiratory specimens during the acute phase of infection.  The lowest concentration of SARS-CoV-2 viral copies this assay can detect is 138 copies/mL. A negative result does not preclude SARS-Cov-2 infection and should not be used as the sole basis for treatment or other patient management decisions. A  negative result may occur with  improper specimen collection/handling, submission of specimen other than nasopharyngeal swab, presence of viral mutation(s) within the areas targeted by this assay, and inadequate number of viral copies(<138 copies/mL). A negative result must be combined with clinical observations, patient history, and epidemiological information. The expected result is Negative.  Fact Sheet for Patients:  EntrepreneurPulse.com.au  Fact Sheet for Healthcare Providers:  IncredibleEmployment.be  This test is no t yet approved or cleared by the [place] FDA and  has been authorized for detection and/or diagnosis of SARS-CoV-2 by FDA under an Emergency Use Authorization (EUA). This EUA will remain  in effect (meaning this test can be used) for the duration of the COVID-19 declaration under Section 564(b)(1) of the Act, 21 U.S.C.section 360bbb-3(b)(1), unless the authorization is terminated  or revoked sooner.       Influenza A by PCR NEGATIVE NEGATIVE Final   Influenza B by PCR NEGATIVE NEGATIVE Final    Comment: (NOTE) The Xpert Xpress SARS-CoV-2/FLU/RSV plus assay is intended as an aid in the diagnosis of influenza from Nasopharyngeal swab specimens and should not be used as a sole basis for treatment. Nasal washings and aspirates are unacceptable for Xpert Xpress SARS-CoV-2/FLU/RSV testing.  Fact Sheet for Patients: EntrepreneurPulse.com.au  Fact Sheet for Healthcare Providers: IncredibleEmployment.be  This test is not yet approved or cleared by the [place] FDA and has been authorized for detection and/or diagnosis of SARS-CoV-2 by FDA under an Emergency Use Authorization (EUA). This EUA will remain in effect (meaning this test can be used) for the duration of the COVID-19 declaration under Section 564(b)(1) of the Act, 21 U.S.C. section 360bbb-3(b)(1), unless the authorization  is terminated or revoked.  Performed at [facility], [address], [place], [place] [address]      Management plans discussed with the patient, and she is in agreement.  CODE STATUS:     Code Status Orders  (From admission, onward)         Start     Ordered   02/21/20 1240  Full code  Continuous        02/21/20 1239        Code Status History    Date Active Date Inactive Code Status Order ID Comments User Context   02/09/2020 1006 02/18/2020 2306 Full Code [id number]  Karmen Bongo, MD ED   10/18/2018 0729 10/21/2018 2022 Full Code [id number]  Harrie Foreman, MD ED   03/29/2018 2320 04/03/2018 1616 Full Code [id number]  Arta Silence, MD Inpatient   11/15/2017 2055 11/21/2017 1459 Full Code [id number]  Salary, Avel Peace, MD Inpatient   10/18/2014 2221 10/23/2014 2043 Full Code [id number]  Jacqulynn Cadet, MD Inpatient   10/18/2014 2119 10/18/2014 2221 Full Code [id number]  Guy Begin, MD Inpatient   Advance Care Planning Activity    Advance Directive Documentation   Flowsheet Row Most Recent Value  Type of Advance Directive Healthcare Power of Attorney  Pre-existing out of facility DNR order (yellow form or pink MOST form) -  "MOST" Form in Place? -      TOTAL TIME TAKING CARE OF THIS PATIENT: 35 minutes.    Loletha Grayer M.D on 02/23/2020 at 4:24 PM  Between 7am to 6pm - Pager - [phone]  After 6pm go to [email or web address] - password [email or web address]  [facility]  CC: Primary care physician; Lenard Simmer, MD

## 2020-02-23 NOTE — Discharge Instructions (Signed)
2 Liters oxygen at rest.  Can increase to 3 L with ambulation. Can take extra 1/2 tablet of lasix in afternoon if short of breath or weight gain of 3 lbs.  Chronic Respiratory Failure  Respiratory failure is a condition in which the lungs do not work well and the breathing (respiratory) system fails. When respiratory failure occurs, it becomes difficult for the lungs to get enough oxygen or to eliminate carbon dioxide (or both). If the lungs do not work properly, the heart, brain, and other body systems do not get enough oxygen. Respiratory failure is life-threatening if not treated. Respiratory failure can be acute or chronic. Acute respiratory failure is sudden and severe and requires emergency medical treatment. Chronic respiratory failure happens over time--months to years--and is usually due to a medical condition that gets worse. What are the causes? This condition may be caused by any problem that affects the heart or lungs. Causes include:  Lung or airway disease, such as: ? Chronic bronchitis and emphysema (COPD). ? Asthma. ? Cystic fibrosis. ? Pulmonary hypertension. ? Pulmonary fibrosis. This is scarring of the lung tissue.  Infection, such as pneumonia.  Nerve or muscle injury or diseases that make chest movements difficult, such as: ? Lou Gehrig's disease. ? Guillain-Barre syndrome. ? Stroke. ? Spinal cord injuries.  Fluid in the lungs (pulmonary edema). This may be due to heart failure or lung injury.  A blood clot in a lung (pulmonary embolism).  Trauma to the chest that makes breathing difficult. This may include a collapsed lung (pneumothorax). What increases the risk? You are more likely to develop this condition if:  You smoke or vape, have a history of smoking or vaping, or have exposure to secondhand smoke.  You have a weak immune system.  You have a family history of breathing problems or lung disease.  You have sleep apnea.  You have congestive heart  failure.  You are obese.  You have been exposed to hazardous substances at work, such as chemicals, asbestos, industrial dyes, or chemical fumes. What are the signs or symptoms? Symptoms of this condition include:  Shortness of breath or difficulty breathing.  A cough with or without mucus (sputum).  Wheezing.  Chest pain or tightness.  A bluish color to the fingernail or toenail beds.  Confusion.  Drowsiness or feeling tired easily, especially with minimal activity. How is this diagnosed? This condition may be diagnosed based on:  Your medical history.  A physical exam.  Other tests, such as: ? Imaging tests. These may include a chest X-ray or CT scan. ? Blood tests, such as an arterial blood gas test. This test is done to check if you have enough oxygen in your blood or if your carbon dioxide levels are too high. ? An electrocardiogram. This test records the electrical activity of your heart. ? An echocardiogram. This test uses sound waves to make an image of your heart. ? Pulmonary function tests. These help to determine if you have chronic lung disease. ? Bronchoscopy. During this test, your health care provider uses a tube with a camera to look inside your airways for signs of inflammation or other problems. How is this treated? Treatment for this condition depends on the cause. Treatment may include:  Breathing in oxygen through a tube with prongs that sit in your nose (nasal cannula) or through a mask that fits over your face.  Receiving noninvasive positive pressure ventilation, called CPAP or BPAP. This is a method of breathing support in  which a machine blows air into your lungs through a mask.  Medicines to help with breathing, such as: ? Medicines that open up and relax air passages, such as bronchodilators. These may be given through a device that turns liquid medicines into a mist you can breathe in (nebulizer). These medicines help with  breathing. ? Diuretics. These medicines get rid of extra fluid in your lungs, which can help you breathe better. ? Steroid medicines. These decrease inflammation in the lungs. ? Antibiotic medicines. These may be given to treat a bacterial infection, such as pneumonia. ? Blood thinners (anticoagulants) to treat blood clots in the lungs.  Pulmonary rehabilitation. This is an exercise program that strengthens the muscles in your chest and helps you learn breathing techniques to manage your condition.  Using a ventilator. This is a breathing machine that delivers oxygen to the lungs through a breathing tube that is put into the trachea. This machine is used when you can no longer breathe well enough on your own. Follow these instructions at home: Medicines  Take over-the-counter and prescription medicines only as told by your health care provider.  If you were prescribed an antibiotic medicine, take it as told by your health care provider. Do not stop taking the antibiotic even if you start to feel better. Lifestyle  Do not use any products that contain nicotine or tobacco, such as cigarettes, e-cigarettes, and chewing tobacco. If you need help quitting, ask your health care provider. Avoid secondhand smoke.  Avoid exposure to irritants that make your breathing problems worse. These include smoke, chemicals, and fumes. General instructions  Use oxygen therapy and do pulmonary rehabilitation if directed by your health care provider. If you require home oxygen therapy, ask your health care provider whether you should purchase a pulse oximeter to measure your oxygen level at home.  If you were given a CPAP machine, make sure that you understand how to use, clean, and care for the machine and that you use it as directed.  Stay active, but balance activity with periods of rest. Exercise and physical activity will help you maintain your ability to do things you want to do.  Stay up to date on all  vaccines, especially pneumonia and yearly flu (influenza) vaccines.  Avoid people who are sick and avoid crowded places during the flu season.  Work with your health care provider to create a plan to help you deal with your condition. Follow this plan.  Keep all follow-up visits as told by your health care provider. This is important. Contact a health care provider if:  Your shortness of breath gets worse and you cannot do the things you used to do.  You have increased sputum, wheezing, coughing, or loss of energy.  You are on oxygen therapy and you are starting to need more.  You need to use your medicines more often.  You have a fever of 100.104F (38C) or higher. Get help right away if:  Your shortness of breath becomes suddenly or significantly worse.  You develop chest pain, tightness, or pressure.  You are unable to say more than a few words without having to catch your breath.  Your lips, toenails, or fingernails are a bluish color.  You become confused or difficult to wake up. These symptoms may represent a serious problem that is an emergency. Do not wait to see if the symptoms will go away. Get medical help right away. Call your local emergency services (911 in the [place] Do not  drive yourself to the hospital. Summary  Respiratory failure is a condition in which the lungs do not work well and the breathing system fails.  This condition can be very serious and is often life-threatening. Chronic respiratory failure has many causes, including COPD, lung infections, and fluid in the lungs. Chronic respiratory failure is lifelong, but its acute symptoms can be very dangerous.  This condition is diagnosed with specific tests and can be treated with medicines, oxygen, or both.  Contact a health care provider if your shortness of breath gets worse, you develop chest pain or fever, or you need to use your oxygen or medicines more often than before. This information is not  intended to replace advice given to you by your health care provider. Make sure you discuss any questions you have with your health care provider. Document Revised: 03/07/2019 Document Reviewed: 03/07/2019 Elsevier Patient Education  2021 [place].   Heart Failure, Diagnosis  Heart failure is a condition in which the heart has trouble pumping blood. This may mean that the heart cannot pump enough blood out to the body or that the heart does not fill up with enough blood. For some people with heart failure, fluid may back up into the lungs. There may also be swelling (edema) in the lower legs. Heart failure is usually a long-term (chronic) condition. It is important for you to take good care of yourself and follow the treatment plan from your health care provider. What are the causes? This condition may be caused by:  High blood pressure (hypertension). Hypertension causes the heart muscle to work harder than normal.  Coronary artery disease, or CAD. CAD is the buildup of cholesterol and fat (plaque) in the arteries of the heart.  Heart attack, also called myocardial infarction. This injures the heart muscle, making it hard for the heart to pump blood.  Abnormal heart valves. The valves do not open and close properly, forcing the heart to pump harder to keep the blood flowing.  Heart muscle disease, inflammation, or infection (cardiomyopathy or myocarditis). This is damage to the heart muscle. It can increase the risk of heart failure.  Lung disease. The heart works harder when the lungs are not healthy. What increases the risk? The risk of heart failure increases as a person ages. This condition is also more likely to develop in people who:  Are obese.  Are female.  Use tobacco or nicotine products.  Abuse alcohol or drugs.  Have taken medicines that can damage the heart, such as chemotherapy drugs.  Have any of these conditions: ? Diabetes. ? Abnormal heart  rhythms. ? Thyroid problems. ? Low blood counts (anemia). ? Chronic kidney disease.  Have a family history of heart failure. What are the signs or symptoms? Symptoms of this condition include:  Shortness of breath with activity, such as when climbing stairs.  A cough that does not go away.  Swelling of the feet, ankles, legs, or abdomen.  Losing or gaining weight for no reason.  Trouble breathing when lying flat.  Waking from sleep because of the need to sit up and get more air.  Rapid heartbeat.  Tiredness (fatigue) and loss of energy.  Feeling light-headed, dizzy, or close to fainting.  Nausea or loss of appetite.  Waking up more often during the night to urinate (nocturia).  Confusion. How is this diagnosed? This condition is diagnosed based on:  Your medical history, symptoms, and a physical exam.  Diagnostic tests, which may include: ? Echocardiogram. ?  Electrocardiogram (ECG). ? Chest X-ray. ? Blood tests. ? Exercise stress test. ? Cardiac MRI. ? Cardiac catheterization and angiogram. ? Radionuclide scans. How is this treated? Treatment for this condition is aimed at managing the symptoms of heart failure. Medicines Treatment may include medicines that:  Help lower blood pressure by relaxing (dilating) the blood vessels. These medicines are called ACE inhibitors (angiotensin-converting enzyme), ARBs (angiotensin receptor blockers), or vasodilators.  Cause the kidneys to remove salt and water from the blood through urination (diuretics).  Improve heart muscle strength and prevent the heart from beating too fast (beta blockers).  Increase the force of the heartbeat (digoxin).  Lower heart rates. Certain diabetes medicines (SGLT-2 inhibitors) may also be used in treatment. Healthy behavior changes Treatment may also include making healthy lifestyle changes, such as:  Reaching and staying at a healthy weight.  Not using tobacco or nicotine  products.  Eating heart-healthy foods.  Limiting or avoiding alcohol.  Stopping the use of illegal drugs.  Being physically active.  Participating in a cardiac rehabilitation program, which is a treatment program to improve your health and well-being through exercise training, education, and counseling. Other treatments Other treatments may include:  Procedures to open blocked arteries or repair damaged valves.  Placing a pacemaker to improve heart function (cardiac resynchronization therapy).  Placing a device to treat serious abnormal heart rhythms (implantable cardioverter defibrillator, or ICD).  Placing a device to improve the pumping ability of the heart (left ventricular assist device, or LVAD).  Receiving a healthy heart from a donor (heart transplant). This is done when other treatments have not helped. Follow these instructions at home:  Manage other health conditions as told by your health care provider. These may include hypertension, diabetes, thyroid disease, or abnormal heart rhythms.  Get ongoing education and support as needed. Learn as much as you can about heart failure.  Keep all follow-up visits. This is important. Summary  Heart failure is a condition in which the heart has trouble pumping blood.  This condition is commonly caused by high blood pressure and other diseases of the heart and lungs.  Symptoms of this condition include shortness of breath, tiredness (fatigue), nausea, and swelling of the feet, ankles, legs, or abdomen.  Treatments for this condition may include medicines, lifestyle changes, and surgery.  Manage other health conditions as told by your health care provider. This information is not intended to replace advice given to you by your health care provider. Make sure you discuss any questions you have with your health care provider. Document Revised: 08/23/2019 Document Reviewed: 08/23/2019 Elsevier Patient Education  [place].

## 2020-02-24 ENCOUNTER — Ambulatory Visit: Payer: BC Managed Care – PPO | Admitting: Family

## 2020-02-24 ENCOUNTER — Other Ambulatory Visit: Payer: Self-pay

## 2020-02-24 ENCOUNTER — Ambulatory Visit: Payer: BC Managed Care – PPO | Attending: Family | Admitting: Family

## 2020-02-24 ENCOUNTER — Encounter: Payer: Self-pay | Admitting: Family

## 2020-02-24 VITALS — BP 152/48 | HR 62 | Resp 18 | Ht 64.0 in | Wt 364.4 lb

## 2020-02-24 DIAGNOSIS — Z79899 Other long term (current) drug therapy: Secondary | ICD-10-CM | POA: Diagnosis not present

## 2020-02-24 DIAGNOSIS — G4730 Sleep apnea, unspecified: Secondary | ICD-10-CM

## 2020-02-24 DIAGNOSIS — I890 Lymphedema, not elsewhere classified: Secondary | ICD-10-CM | POA: Insufficient documentation

## 2020-02-24 DIAGNOSIS — I5032 Chronic diastolic (congestive) heart failure: Secondary | ICD-10-CM | POA: Insufficient documentation

## 2020-02-24 DIAGNOSIS — G4733 Obstructive sleep apnea (adult) (pediatric): Secondary | ICD-10-CM | POA: Insufficient documentation

## 2020-02-24 DIAGNOSIS — E1122 Type 2 diabetes mellitus with diabetic chronic kidney disease: Secondary | ICD-10-CM | POA: Diagnosis not present

## 2020-02-24 DIAGNOSIS — Z7951 Long term (current) use of inhaled steroids: Secondary | ICD-10-CM | POA: Diagnosis not present

## 2020-02-24 DIAGNOSIS — I130 Hypertensive heart and chronic kidney disease with heart failure and stage 1 through stage 4 chronic kidney disease, or unspecified chronic kidney disease: Secondary | ICD-10-CM | POA: Insufficient documentation

## 2020-02-24 DIAGNOSIS — Z853 Personal history of malignant neoplasm of breast: Secondary | ICD-10-CM | POA: Diagnosis not present

## 2020-02-24 DIAGNOSIS — E785 Hyperlipidemia, unspecified: Secondary | ICD-10-CM | POA: Insufficient documentation

## 2020-02-24 DIAGNOSIS — Z86711 Personal history of pulmonary embolism: Secondary | ICD-10-CM | POA: Diagnosis not present

## 2020-02-24 DIAGNOSIS — I10 Essential (primary) hypertension: Secondary | ICD-10-CM

## 2020-02-24 DIAGNOSIS — N183 Chronic kidney disease, stage 3 unspecified: Secondary | ICD-10-CM | POA: Diagnosis not present

## 2020-02-24 DIAGNOSIS — Z7901 Long term (current) use of anticoagulants: Secondary | ICD-10-CM | POA: Diagnosis not present

## 2020-02-24 NOTE — Progress Notes (Signed)
Patient ID: Nicole Schmidt, female    DOB: 03/19/1965, 55 y.o.   MRN: [id number]  HPI  Nicole Schmidt is a 55 y/o female with a history of breast cancer, DM, HTN, CKD, thyroid disease, anemia, gout, pulmonary embolus and chronic heart failure.   Echo report from 02/09/20 reviewed and showed an EF of 65-70% along with severe LVH and moderate LAE. Echo report from 10/20/2018 reviewed and showed an EF of 60-65% along with an elevated PA pressure of 50.7 mmHg. Echo report from 11/20/17 reviewed and showed an EF of 55-60%.  Admitted 02/21/20 due to shortness of breath and low O2 sats. Given IV lasix with transition to oral diuretics. Can increase oxygen to 3-4L with ambulation to keep sats >90%. Discharged after 2 days. Admitted 02/09/20 due to shortness of breath, weight gain and pedal edema. Given IV lasix along with IV antibiotics for concern about pneumonia. Then transitioned to oral medications. Pulmonology consult obtained. Unable to be weaned off oxygen and needed 2L. Aldactone and losartan held due to hyperkalemia. Discharged after 9 days.  Went to Urgent Care 01/19/20 due to gout pain where she was evaluated and released.    She presents today for a follow-up visit with a chief complaint of moderate fatigue with very little exertion. She describes this as chronic in nature although has worsening quite a bit with her two recent admissions. She has associated shortness of breath, pedal edema, easy bruising and difficulty sleeping along with this. She denies any dizziness, abdominal distention, palpitations, chest pain, cough or weight gain.   Says that she just got home yesterday and has a PT evaluation tomorrow. She does report feeling extremely fatigued and weak.   Past Medical History:  Diagnosis Date  . (HFpEF) heart failure with preserved ejection fraction ([place])    a. 10/2018 Echo: EF 60-65%, diast dysfxn, RVSP 50.7 mmHg, mildly dil LA.  Marland Kitchen Acquired hypothyroidism 02/09/2020  . Anemia   . Arthritis    . Breast cancer ([place]) 2014   right breast cancer  . Breast cancer of upper-inner quadrant of right female breast ([place])    Right breast invasive CA and DCIS , 7 mm T1,N0,M0. Er/PR pos, her 2 negative.  Margins 1 mm.  . CHF (congestive heart failure) ([place])   . CKD (chronic kidney disease), stage III ([place])   . Diabetes mellitus type 2 in obese ([place]) 02/09/2020  . Diabetes mellitus without complication ([place])   . Dyslipidemia 02/09/2020  . Essential hypertension   . Gout   . Hypothyroidism   . Menopause    age 39  . Morbid obesity ([place])   . Persistent atrial fibrillation ([place])    a. Dx 10/2018 in setting of PNA. CHA2DS2VASc = 4-->Eliquis; b. 11/2018 s/p successful DCCV.  Marland Kitchen Personal history of radiation therapy   . Pulmonary embolism ([place]) 10/2014   a. Chronic eliquis.  . Thyroid goiter    Past Surgical History:  Procedure Laterality Date  . BREAST BIOPSY Right 2014   breast ca  . BREAST EXCISIONAL BIOPSY Right 07/19/2012   breast ca  . BREAST SURGERY Right 2014   with sentinel node bx subareaolar duct excision  . CARDIOVERSION N/A 11/28/2018   Procedure: CARDIOVERSION;  Surgeon: Minna Merritts, MD;  Location: [facility] ORS;  Service: Cardiovascular;  Laterality: N/A;  . COLONOSCOPY WITH PROPOFOL N/A 01/30/2017   Procedure: COLONOSCOPY WITH PROPOFOL;  Surgeon: Christene Lye, MD;  Location: [facility] ENDOSCOPY;  Service: Endoscopy;  Laterality: N/A;  Family History  Problem Relation Age of Onset  . Diabetes Father   . Hypertension Father   . Parkinson's disease Father   . Hypertension Mother   . Rheum arthritis Mother   . Atrial fibrillation Mother   . Heart failure Mother   . Heart attack Mother        died in her mid-70's.  . Colon cancer Cousin 54  . Breast cancer Neg Hx    Social History   Tobacco Use  . Smoking status: Never Smoker  . Smokeless tobacco: Never Used  Substance Use Topics  . Alcohol use: No    Alcohol/week: 0.0 standard drinks   No Known  Allergies  Prior to Admission medications   Medication Sig Start Date End Date Taking? Authorizing Provider  acetaminophen (TYLENOL) 500 MG tablet Take 1,000 mg by mouth every 6 (six) hours as needed (for pain).   Yes [provider]  amLODipine (NORVASC) 5 MG tablet Take 5 mg by mouth 2 (two) times daily. 09/18/18  Yes [provider]  atorvastatin (LIPITOR) 20 MG tablet Take 20 mg by mouth at bedtime.    Yes [provider]  calcium carbonate (OSCAL) 1500 (600 Ca) MG TABS tablet Take 600 mg of elemental calcium by mouth daily.    Yes [provider]  Cholecalciferol (VITAMIN D3) 50000 units TABS Take 50,000 Units by mouth every Friday.    Yes [provider]  CINNAMON PO Take 1,000 mg by mouth daily.    Yes [provider]  cloNIDine (CATAPRES) 0.2 MG tablet Take 1 tablet (0.2 mg total) by mouth 2 (two) times daily. 10/21/18  Yes Mayo, Pete Pelt, MD  CRANBERRY CONCENTRATE PO Take 1 capsule by mouth daily.   Yes [provider]  Dulaglutide 1.5 MG/0.5ML SOPN Inject 1.5 mg into the skin every 7 (seven) days.   Yes [provider]  ELIQUIS 5 MG TABS tablet TAKE 1 TABLET TWICE A DAY 12/23/19  Yes Corcoran, Melissa C, MD  folic acid (FOLVITE) 250 MCG tablet Take 800 mcg by mouth daily.    Yes [provider]  furosemide (LASIX) 40 MG tablet Take 1 tablet (40 mg total) by mouth daily. 02/23/20  Yes Loletha Grayer, MD  KLOR-CON M20 20 MEQ tablet TAKE 1 TABLET DAILY 10/20/19  Yes Darylene Price A, FNP  levothyroxine (SYNTHROID, LEVOTHROID) 112 MCG tablet Take 112 mcg daily before breakfast by mouth.   Yes [provider]  Melatonin 10 MG TABS Take 10 mg by mouth at bedtime.   Yes [provider]  metoprolol tartrate (LOPRESSOR) 50 MG tablet TAKE 1 TABLET TWICE A DAY 08/04/19  Yes Wellington Hampshire, MD  vitamin C (ASCORBIC ACID) 500 MG tablet Take 500 mg by mouth 2 (two) times daily.   Yes [provider]   fluticasone-salmeterol (ADVAIR HFA) 230-21 MCG/ACT inhaler Inhale 2 puffs into the lungs 2 (two) times daily as needed.  Patient not taking: Reported on 02/03/2020  02/09/20  [provider]    Review of Systems  Constitutional: Positive for fatigue (easily). Negative for appetite change.  HENT: Negative for congestion, postnasal drip and sore throat.   Eyes: Negative.   Respiratory: Positive for shortness of breath (with minimal exertion). Negative for cough.   Cardiovascular: Positive for leg swelling. Negative for chest pain and palpitations.  Gastrointestinal: Negative for abdominal distention and abdominal pain.  Endocrine: Negative.   Genitourinary: Negative.   Musculoskeletal: Positive for arthralgias (feet at times). Negative  for back pain.  Skin: Negative.   Allergic/Immunologic: Negative.   Neurological: Positive for weakness. Negative for dizziness and light-headedness.  Hematological: Negative for adenopathy. Bruises/bleeds easily.  Psychiatric/Behavioral: Positive for sleep disturbance (wearing CPAP). Negative for dysphoric mood. The patient is not nervous/anxious.    Vitals:   02/24/20 1145  BP: (!) 152/48  Pulse: 62  Resp: 18  SpO2: 92%  Weight: (!) 364 lb 6 oz (165.3 kg)  Height: 5\' 4"  (1.626 m)   Wt Readings from Last 3 Encounters:  02/24/20 (!) 364 lb 6 oz (165.3 kg)  02/22/20 (!) 361 lb 8.9 oz (164 kg)  02/14/20 (!) 369 lb 4.3 oz (167.5 kg)   Lab Results  Component Value Date   CREATININE 2.03 (H) 02/23/2020   CREATININE 1.78 (H) 02/22/2020   CREATININE 1.81 (H) 02/20/2020    Physical Exam Vitals and nursing note reviewed.  Constitutional:      Appearance: She is well-developed.  HENT:     Head: Normocephalic and atraumatic.  Cardiovascular:     Rate and Rhythm: Normal rate and regular rhythm.  Pulmonary:     Effort: Pulmonary effort is normal.     Breath sounds: Normal breath sounds. No wheezing or rales.  Abdominal:     Palpations:  Abdomen is soft.     Tenderness: There is no abdominal tenderness.  Musculoskeletal:     Right lower leg: No tenderness. Edema (trace pitting) present.     Left lower leg: No tenderness. Edema (trace pitting) present.     Comments: (had lymph node removal 2014 in right arm)  Skin:    General: Skin is warm and dry.  Neurological:     General: No focal deficit present.     Mental Status: She is alert and oriented to person, place, and time.  Psychiatric:        Mood and Affect: Mood normal.        Behavior: Behavior normal.   Assessment & Plan:  1: Chronic heart failure with preserved ejection fraction with structural changes (LVH)- - NYHA class III - euvolemic today - weighing daily; reminded to weigh every morning, write the weight down and call for an overnight weight gain of >2 pounds or a weekly weight gain of >5 pounds - weight unchanged from last visit here 1 month ago - has initial PT evaluation tomorrow; she says that she gets very fatigued extremely easy right now - saw cardiology Fletcher Anon) 11/04/19 & returns 03/01/20 - not adding salt and has been reading food labels; reviewed the importance of closely following a 2000mg  sodium diet  - BNP 02/20/20 was 210.7  2: HTN- - BP mildly elevated today - BMP from 02/23/20 reviewed and showed sodium 143, potassium 4.9, creatinine 2.03 and GFR 28 - sees PCP [facility])  - saw nephrology Holley Raring) 01/29/20  3: Lymphedema- - stage 2 - encouraged her to elevate her legs when sitting for long periods of time  4: Obstructive sleep apnea- - saw pulmonology ([place]) 02/03/20 - wearing CPAP - now wearing oxygen at 2L around the clock and will occasionally increase it to 3L if needed; checks her oxygen sat at home but is planning on getting new pulse oximeter   Patient did not bring her medications nor a list. Each medication was verbally reviewed with the patient and she was encouraged to bring the bottles to every visit to confirm accuracy  of list.  Return in 5 months or sooner for any questions/problems before then.

## 2020-02-24 NOTE — Patient Instructions (Signed)
Continue weighing daily and call for an overnight weight gain of > 2 pounds or a weekly weight gain of >5 pounds. 

## 2020-02-25 ENCOUNTER — Ambulatory Visit: Payer: BC Managed Care – PPO | Attending: Endocrinology

## 2020-02-25 ENCOUNTER — Other Ambulatory Visit: Payer: Self-pay

## 2020-02-25 DIAGNOSIS — Z9981 Dependence on supplemental oxygen: Secondary | ICD-10-CM | POA: Diagnosis not present

## 2020-02-25 DIAGNOSIS — M6281 Muscle weakness (generalized): Secondary | ICD-10-CM | POA: Insufficient documentation

## 2020-02-25 DIAGNOSIS — R262 Difficulty in walking, not elsewhere classified: Secondary | ICD-10-CM | POA: Diagnosis not present

## 2020-02-25 DIAGNOSIS — R2689 Other abnormalities of gait and mobility: Secondary | ICD-10-CM | POA: Diagnosis not present

## 2020-02-25 NOTE — Patient Instructions (Signed)
Access Code: [email or web address] URL: [email or web address] Date: 02/25/2020 Prepared by: Janna Arch  Exercises Seated March - 1 x daily - 7 x weekly - 2 sets - 10 reps - 5 hold Seated Long Arc Quad - 1 x daily - 7 x weekly - 2 sets - 10 reps - 5 hold Seated Heel Toe Raises - 1 x daily - 7 x weekly - 2 sets - 10 reps - 5 hold

## 2020-02-25 NOTE — Therapy (Signed)
[place] MAIN [facility] SERVICES [address] [place], [place], [address] Phone: [phone]   Fax:  [phone]  Physical Therapy Evaluation  Patient Details  Name: Nicole Schmidt MRN: [id number] Date of Birth: 01-06-1966 Referring Provider (PT): [place], [place]   Encounter Date: 02/25/2020   PT End of Session - 02/25/20 1714    Visit Number 1    Number of Visits 16    Date for PT Re-Evaluation 04/21/20    Authorization Type 1/10 eval 02/25/20    PT Start Time 1312    PT Stop Time 1359    PT Time Calculation (min) 47 min    Equipment Utilized During Treatment Gait belt;Oxygen   2 L via nasal cannula   Activity Tolerance Patient limited by fatigue;Other (comment)   Sp02 limitations   Behavior During Therapy [facility] for tasks assessed/performed           Past Medical History:  Diagnosis Date  . (HFpEF) heart failure with preserved ejection fraction ([place])    a. 10/2018 Echo: EF 60-65%, diast dysfxn, RVSP 50.7 mmHg, mildly dil LA.  Marland Kitchen Acquired hypothyroidism 02/09/2020  . Anemia   . Arthritis   . Breast cancer ([place]) 2014   right breast cancer  . Breast cancer of upper-inner quadrant of right female breast ([place])    Right breast invasive CA and DCIS , 7 mm T1,N0,M0. Er/PR pos, her 2 negative.  Margins 1 mm.  . CHF (congestive heart failure) ([place])   . CKD (chronic kidney disease), stage III ([place])   . Diabetes mellitus type 2 in obese ([place]) 02/09/2020  . Diabetes mellitus without complication ([place])   . Dyslipidemia 02/09/2020  . Essential hypertension   . Gout   . Hypothyroidism   . Menopause    age 55  . Morbid obesity ([place])   . Persistent atrial fibrillation ([place])    a. Dx 10/2018 in setting of PNA. CHA2DS2VASc = 4-->Eliquis; b. 11/2018 s/p successful DCCV.  Marland Kitchen Personal history of radiation therapy   . Pulmonary embolism ([place]) 10/2014   a. Chronic eliquis.  . Thyroid goiter     Past Surgical History:  Procedure Laterality Date  .  BREAST BIOPSY Right 2014   breast ca  . BREAST EXCISIONAL BIOPSY Right 07/19/2012   breast ca  . BREAST SURGERY Right 2014   with sentinel node bx subareaolar duct excision  . CARDIOVERSION N/A 11/28/2018   Procedure: CARDIOVERSION;  Surgeon: Minna Merritts, MD;  Location: [facility] ORS;  Service: Cardiovascular;  Laterality: N/A;  . COLONOSCOPY WITH PROPOFOL N/A 01/30/2017   Procedure: COLONOSCOPY WITH PROPOFOL;  Surgeon: Christene Lye, MD;  Location: [facility] ENDOSCOPY;  Service: Endoscopy;  Laterality: N/A;    There were no vitals filed for this visit.    Subjective Assessment - 02/25/20 1321    Subjective Patient presents to physical therapy for evaluation of weakness.    Pertinent History Patient presents to physical therapy for weakness. Has been to ED twice since Christmas.Was discharged from hospital on 02/23/20.  Patient presents with 2 L of 02 via nasal cannula which is new to her. Is monitoring her Sp02 at home and noticing it is low. PMH includes HFpEF, hypothyroidism, anemia, arthritis, breast cancer, CHF, CKD, DM, Dyslipidemia, HTN, gout, hypothyroidism, morbid obesity, a fib, PE, thyroid goiter.    Limitations Lifting;Standing;Walking;House hold activities;Other (comment)    How long can you sit comfortably? no problem    How long can you stand comfortably? several  minutes    How long can you walk comfortably? not sure. gets winded quickly.    Currently in Pain? No/denies              [facility] PT Assessment - 02/25/20 0001      Assessment   Medical Diagnosis weakness    Referring Provider (PT) morayati, shamil    Onset Date/Surgical Date --   in the past year   Hand Dominance Right    Prior Therapy yes      Precautions   Precautions None      Restrictions   Weight Bearing Restrictions No      Balance Screen   Has the patient fallen in the past 6 months No    Has the patient had a decrease in activity level because of a fear of falling?  Yes    Is the  patient reluctant to leave their home because of a fear of falling?  Yes      [place] Private residence    Living Arrangements Alone    Available Help at Discharge Family    Type of [place] One level    [place] - 2 wheels;[place] - single point      Prior Function   Level of Independence Independent with basic ADLs    Vocation Retired      Associate Professor   Overall Cognitive Status No family/caregiver present to determine baseline cognitive functioning         Sp02 drop to 91 with 10 MWT    PAIN: No pain reported.   POSTURE: Rounded shoulders, knees slightly abducted due to body habitus   PROM/AROM:   AROM BLE:  Within functional limits however hip extension is limited in functional mobility    STRENGTH:  Graded on a 0-5 scale Muscle Group Left Right  Hip Flex 4-/5 4-/5  Hip Abd 3+/5 3+/5  Hip Add 3/5 3/5  Hip Ext 2+/5 2+/5  Hip IR/ER /5 /5  Knee Flex 3+/5 3+/5  Knee Ext 4-/5 4-/5  Ankle DF 3/5 3/5  Ankle PF 4-/5 4-/5   SENSATION:  BUE :  BLE :   NEUROLOGICAL SCREEN: (2+ unless otherwise noted.) N=normal  Ab=abnormal   Level Dermatome R L  C3 Anterior Neck  N N  C4 Top of Shoulder N N  C5 Lateral Upper Arm  N N  C6 Lateral Arm/ Thumb  N N  C7 Middle Finger  N N  C8 4th & 5th Finger N N  T1 Medial Arm N N  L2 Medial thigh/groin N N  L3 Lower thigh/med.knee N N  L4 Medial leg/lat thigh N N  L5 Lat. leg & dorsal foot Ab Ab  S1 post/lat foot/thigh/leg Ab Ab  S2 Post./med. thigh & leg N N    SOMATOSENSORY:  Any N & T in extremities or weakness: reports :         Sensation           Intact      Diminished         Absent  Light touch LEs Feet bilaterally                                   FUNCTIONAL MOBILITY: STS: requires UE support for transfer   BALANCE: Static Sitting Balance  Normal Able to maintain balance against maximal resistance   Good Able to  maintain balance against moderate resistance   Good-/Fair+ Accepts minimal resistance x  Fair Able to sit unsupported without balance loss and without UE support   Poor+ Able to maintain with Minimal assistance from individual or chair   Poor Unable to maintain balance-requires mod/max support from individual or chair    Static Standing Balance  Normal Able to maintain standing balance against maximal resistance   Good Able to maintain standing balance against moderate resistance   Good-/Fair+ Able to maintain standing balance against minimal resistance   Fair Able to stand unsupported without UE support and without LOB for 1-2 min x  Fair- Requires Min A and UE support to maintain standing without loss of balance   Poor+ Requires mod A and UE support to maintain standing without loss of balance   Poor Requires max A and UE support to maintain standing balance without loss    Dynamic Sitting Balance  Normal Able to sit unsupported and weight shift across midline maximally   Good Able to sit unsupported and weight shift across midline moderately   Good-/Fair+ Able to sit unsupported and weight shift across midline minimally x  Fair Minimal weight shifting ipsilateral/front, difficulty crossing midline   Fair- Reach to ipsilateral side and unable to weight shift   Poor + Able to sit unsupported with min A and reach to ipsilateral side, unable to weight shift   Poor Able to sit unsupported with mod A and reach ipsilateral/front-can't cross midline    Standing Dynamic Balance  Normal Stand independently unsupported, able to weight shift and cross midline maximally   Good Stand independently unsupported, able to weight shift and cross midline moderately   Good-/Fair+ Stand independently unsupported, able to weight shift across midline minimally   Fair Stand independently unsupported, weight shift, and reach ipsilaterally, loss of balance when crossing midline x  Poor+ Able to stand with Min A  and reach ipsilaterally, unable to weight shift   Poor Able to stand with Mod A and minimally reach ipsilaterally, unable to cross midline.      GAIT: Ambulate ~68 ft drop to 87% sp02, lateral momentum with forward steppage, requires close proximity to wall.   OUTCOME MEASURES: TEST Outcome Interpretation  5 times sit<>stand sec >60 yo, >15 sec indicates increased risk for falls  10 meter walk test        13.9 seconds fast 15.8 seconds with standard walk speed.          m/s <1.0 m/s indicates increased risk for falls; limited community ambulator  FOTO 45% Predicted discharge score of 54%       Access Code: [email or web address] URL: [email or web address] Date: 02/25/2020 Prepared by: Janna Arch  Exercises Seated March - 1 x daily - 7 x weekly - 2 sets - 10 reps - 5 hold Seated Long Arc Quad - 1 x daily - 7 x weekly - 2 sets - 10 reps - 5 hold Seated Heel Toe Raises - 1 x daily - 7 x weekly - 2 sets - 10 reps - 5 hold          Objective measurements completed on examination: See above findings.               PT Education - 02/25/20 1714    Education Details goals, POC, HEP    Person(s) Educated Patient    Methods Explanation;Demonstration;Tactile cues;Verbal cues;Handout    Comprehension Verbalized understanding;Returned  demonstration;Verbal cues required;Tactile cues required            PT Short Term Goals - 02/25/20 1719      PT SHORT TERM GOAL #1   Title Patient will be independent in home exercise program to improve strength/mobility for better functional independence with ADLs.    Baseline 1/12: HEP given    Time 2    Period Weeks    Status New    Target Date 03/10/20             PT Long Term Goals - 02/25/20 1720      PT LONG TERM GOAL #1   Title Patient will increase FOTO score to equal to or greater than   54%  to demonstrate statistically significant improvement in mobility and quality of life.    Baseline 1/12: 45%    Time 8     Period Weeks    Status New    Target Date 04/21/20      PT LONG TERM GOAL #2   Title Patient will increase 10 meter walk test to >1.44m/s as to improve gait speed for better community ambulation and to reduce fall risk while maintaining SP02>90%    Baseline 1/12: 0.7 m/s    Time 8    Period Weeks    Status New    Target Date 04/21/20      PT LONG TERM GOAL #3   Title Patient will increase BLE gross strength to 4+/5 as to improve functional strength for independent gait, increased standing tolerance and increased ADL ability.    Baseline 1/12: see note    Time 8    Period Weeks    Status New    Target Date 04/21/20      PT LONG TERM GOAL #4   Title Patient will ambulate >200 ft without an AD with Sp02>90% for increased functional mobility.    Baseline 1/12: 68 ft wtih Sp02 drop    Time 8    Period Weeks    Status New    Target Date 04/21/20                  Plan - 02/25/20 1717    Clinical Impression Statement Patient's session is limited by late arrival as well as as by Sp02. Patient presents to physical therapy for evlauation of weakness. She is newly reliant upon oxygen and throughout evaluation required monitoring of Sp02. Sp02 dropped to 87% with ambulation requiring termination of task and rest to return to >90%. Patient will benefit from skilled physical therapy to improve capacity for functional mobility and strength for improved quality of life.    Personal Factors and Comorbidities Age;Comorbidity 3+;Finances;Fitness;Past/Current Experience;Transportation    Comorbidities HFpEF, hypothyroidism, anemia, arthritis, breast cancer, CHF, CKD, DM, Dyslipidemia, HTN, gout, hypothyroidism, morbid obesity, a fib, PE, thyroid goiter.    Examination-Activity Limitations Bathing;Bed Mobility;Bend;Caring for Others;Carry;Dressing;Locomotion Level;Lift;Squat;Stairs;Stand;Toileting;Transfers    Examination-Participation Restrictions Church;Cleaning;Community  Activity;Driving;Interpersonal Relationship;Meal Prep;Laundry;Shop;Volunteer;Yard Work    Merchant [employer] officer Evolving/Moderate complexity    Clinical Decision Making Moderate    Rehab Potential Fair    PT Frequency 2x / week    PT Duration 8 weeks    PT Treatment/Interventions Patient/family education;Therapeutic activities;Therapeutic exercise;Functional mobility training;Balance training;Neuromuscular re-education;ADLs/Self Care Home Management;Aquatic Therapy;Canalith Repostioning;Cryotherapy;Electrical Stimulation;Iontophoresis 4mg /ml Dexamethasone;Moist Heat;Traction;Ultrasound;DME Instruction;Gait training;Stair training;Orthotic Fit/Training;Manual techniques;Dry needling;Taping;Vestibular;Visual/perceptual remediation/compensation    PT Next Visit Plan strengthening , monitor Sp02    PT Home Exercise Plan see above    Consulted and Agree  with Plan of Care Patient           Patient will benefit from skilled therapeutic intervention in order to improve the following deficits and impairments:  Abnormal gait,Decreased balance,Decreased endurance,Decreased mobility,Difficulty walking,Obesity,Decreased strength,Cardiopulmonary status limiting activity,Decreased activity tolerance,Decreased safety awareness,Impaired flexibility,Impaired perceived functional ability,Impaired sensation,Postural dysfunction,Improper body mechanics  Visit Diagnosis: Muscle weakness (generalized)  Other abnormalities of gait and mobility     Problem List Patient Active Problem List   Diagnosis Date Noted  . AF (paroxysmal atrial fibrillation) ([place])   . Acquired thrombophilia ([place])   . Acute on chronic respiratory failure with hypoxia ([place]) 02/21/2020  . Acute on chronic respiratory failure with hypoxemia ([place]) 02/21/2020  . Atrial fibrillation, chronic ([place]) 02/09/2020  . Dyslipidemia 02/09/2020  . Diabetes mellitus type 2 in obese ([place]) 02/09/2020  . Acquired hypothyroidism 02/09/2020   . Thrush, oral 02/09/2020  . Morbid obesity with BMI of 60.0-69.9, adult ([place]) 02/03/2020  . Chronic anticoagulation 12/30/2019  . OSA on CPAP 08/04/2019  . Atrial fibrillation with RVR ([place]) 10/18/2018  . Acute hypoxemic respiratory failure ([place]) 03/29/2018  . Acute on chronic diastolic CHF (congestive heart failure) ([place]) 02/04/2018  . Lymphedema 02/04/2018  . Essential hypertension 12/09/2017  . Long term (current) use of aromatase inhibitors 11/17/2017  . Elevated alkaline phosphatase level 05/20/2017  . Goals of care, counseling/discussion 05/14/2017  . Chronic renal disease, stage III ([place]) 07/08/2015  . Iron deficiency anemia 07/08/2015  . Acute renal failure superimposed on stage 3 chronic kidney disease ([place]) 10/22/2014  . History of breast cancer 10/22/2014  . Elevated troponin 10/22/2014  . Pulmonary emboli ([place]) 10/18/2014  . Pulmonary embolism ([place]) 10/18/2014  . Candidiasis of breast 07/06/2014  . Malignant neoplasm of right female breast ([place]) 07/24/2012   Janna Arch, PT, DPT   02/25/2020, 5:24 PM  [place] MAIN [facility] SERVICES [address] [place], [place], [address] Phone: [phone]   Fax:  [phone]  Name: Nicole Schmidt MRN: [id number] Date of Birth: 10-28-1965

## 2020-02-27 ENCOUNTER — Other Ambulatory Visit: Payer: Self-pay

## 2020-02-27 ENCOUNTER — Ambulatory Visit: Payer: BC Managed Care – PPO | Admitting: Medical

## 2020-02-27 ENCOUNTER — Encounter: Payer: Self-pay | Admitting: Physician Assistant

## 2020-02-27 VITALS — BP 114/66 | HR 59 | Ht 64.0 in | Wt 364.0 lb

## 2020-02-27 DIAGNOSIS — R0603 Acute respiratory distress: Secondary | ICD-10-CM

## 2020-02-27 DIAGNOSIS — I5032 Chronic diastolic (congestive) heart failure: Secondary | ICD-10-CM | POA: Diagnosis not present

## 2020-02-27 DIAGNOSIS — I10 Essential (primary) hypertension: Secondary | ICD-10-CM | POA: Diagnosis not present

## 2020-02-27 DIAGNOSIS — N183 Chronic kidney disease, stage 3 unspecified: Secondary | ICD-10-CM | POA: Diagnosis not present

## 2020-02-27 DIAGNOSIS — E039 Hypothyroidism, unspecified: Secondary | ICD-10-CM | POA: Diagnosis not present

## 2020-02-27 DIAGNOSIS — D352 Benign neoplasm of pituitary gland: Secondary | ICD-10-CM | POA: Diagnosis not present

## 2020-02-27 DIAGNOSIS — G4730 Sleep apnea, unspecified: Secondary | ICD-10-CM

## 2020-02-27 DIAGNOSIS — E559 Vitamin D deficiency, unspecified: Secondary | ICD-10-CM | POA: Diagnosis not present

## 2020-02-27 DIAGNOSIS — I2699 Other pulmonary embolism without acute cor pulmonale: Secondary | ICD-10-CM | POA: Diagnosis not present

## 2020-02-27 DIAGNOSIS — E119 Type 2 diabetes mellitus without complications: Secondary | ICD-10-CM | POA: Diagnosis not present

## 2020-02-27 DIAGNOSIS — C50819 Malignant neoplasm of overlapping sites of unspecified female breast: Secondary | ICD-10-CM | POA: Diagnosis not present

## 2020-02-27 DIAGNOSIS — E785 Hyperlipidemia, unspecified: Secondary | ICD-10-CM | POA: Diagnosis not present

## 2020-02-27 DIAGNOSIS — I480 Paroxysmal atrial fibrillation: Secondary | ICD-10-CM | POA: Diagnosis not present

## 2020-02-27 NOTE — Progress Notes (Signed)
Cardiology Office Note:    Date:  02/27/2020   ID:  Nicole Schmidt, DOB May 08, 1965, MRN [id number]  PCP:  Lenard Simmer, MD  [facility] Cardiologist:  Kathlyn Sacramento, MD  [facility] Electrophysiologist:  None   Referring MD: Lenard Simmer, MD   Chief Complaint: Hospital follow-up  History of Present Illness:    Nicole Schmidt is a 55 y.o. female with a hx of paroxysmal afib On Eliquis s/p DCCV in October 2020 with no recurrence, chronic diastolic heart failure, breast cancer s/p radiation, DM2, HTN, CKD, thyroid disease, morbid obesity, anemia of choric disease, gout, OSA on CPAP, pulmonary embolism who is being seen for hospital follow-up for heart failure.   The patient was hospitalized 12/27-1/5 for respiratory distress due to acute diasotlic heart failure and pneumonia. She was treated with IV lasix and IV antibiotics. Echo showed EF 65-70%, severe LVH, G2DD, mod LAE, and mild RAE.  Pulmonology saw the patient. Spironolactone was discontinued 2/2 hyperkalemia. She was sent hom ewith 2L O2. She was sent home with lasix 40 mg daily. At home the patient noted her O2 levels were below 90.   The patient presented back to the hospital 1/8-1/10 with acute on chronic respiratory failure with hypoxia, also felt to be volume overloaded. She was treated with IV lasix and sent home on lasix 40 mg daily. Apparently on the day of discharge O2 levels were dropping down into the 89% and it was recommended she go up to 3-4L if needed.   Today, the patient comes in and is still on supplemental O2 at 2L. This is new since the first hospitalization. She would like to get off the oxygen. However, she reports that her O2 levels drops into the 70s on exertion. She is not increasing the O2 levels to 3-4L. If she rests the O2 levels will go back up quickly. She reports stable weights at home. Weights appear stable since December. Denies lower leg edema. She sleeps in a recliner, which is unchanged  since 2020. She has a CPAP but has been using oxygen at night. Denies chest pain. No significant lower leg edema on exam. EKG shows SB.   Past Medical History:  Diagnosis Date  . (HFpEF) heart failure with preserved ejection fraction ([place])    a. 10/2018 Echo: EF 60-65%, diast dysfxn, RVSP 50.7 mmHg, mildly dil LA.  Marland Kitchen Acquired hypothyroidism 02/09/2020  . Anemia   . Arthritis   . Breast cancer ([place]) 2014   right breast cancer  . Breast cancer of upper-inner quadrant of right female breast ([place])    Right breast invasive CA and DCIS , 7 mm T1,N0,M0. Er/PR pos, her 2 negative.  Margins 1 mm.  . CHF (congestive heart failure) ([place])   . CKD (chronic kidney disease), stage III ([place])   . Diabetes mellitus type 2 in obese ([place]) 02/09/2020  . Diabetes mellitus without complication ([place])   . Dyslipidemia 02/09/2020  . Essential hypertension   . Gout   . Hypothyroidism   . Menopause    age 75  . Morbid obesity ([place])   . Persistent atrial fibrillation ([place])    a. Dx 10/2018 in setting of PNA. CHA2DS2VASc = 4-->Eliquis; b. 11/2018 s/p successful DCCV.  Marland Kitchen Personal history of radiation therapy   . Pulmonary embolism ([place]) 10/2014   a. Chronic eliquis.  . Thyroid goiter     Past Surgical History:  Procedure Laterality Date  . BREAST BIOPSY Right 2014   breast  ca  . BREAST EXCISIONAL BIOPSY Right 07/19/2012   breast ca  . BREAST SURGERY Right 2014   with sentinel node bx subareaolar duct excision  . CARDIOVERSION N/A 11/28/2018   Procedure: CARDIOVERSION;  Surgeon: Minna Merritts, MD;  Location: [facility] ORS;  Service: Cardiovascular;  Laterality: N/A;  . COLONOSCOPY WITH PROPOFOL N/A 01/30/2017   Procedure: COLONOSCOPY WITH PROPOFOL;  Surgeon: Christene Lye, MD;  Location: [facility] ENDOSCOPY;  Service: Endoscopy;  Laterality: N/A;    Current Medications: Current Meds  Medication Sig  . acetaminophen (TYLENOL) 500 MG tablet Take 1,000 mg by mouth every 6 (six) hours as needed (for  pain).  Marland Kitchen amLODipine (NORVASC) 5 MG tablet Take 5 mg by mouth 2 (two) times daily.  Marland Kitchen atorvastatin (LIPITOR) 20 MG tablet Take 20 mg by mouth at bedtime.   . calcium carbonate (OSCAL) 1500 (600 Ca) MG TABS tablet Take 600 mg of elemental calcium by mouth daily.   . Cholecalciferol (VITAMIN D3) 50000 units TABS Take 50,000 Units by mouth every Friday.   Marland Kitchen CINNAMON PO Take 1,000 mg by mouth daily.   . cloNIDine (CATAPRES) 0.2 MG tablet Take 1 tablet (0.2 mg total) by mouth 2 (two) times daily.  Marland Kitchen CRANBERRY CONCENTRATE PO Take 1 capsule by mouth daily.  . Dulaglutide 1.5 MG/0.5ML SOPN Inject 1.5 mg into the skin every 7 (seven) days.  Marland Kitchen ELIQUIS 5 MG TABS tablet TAKE 1 TABLET TWICE A DAY  . folic acid (FOLVITE) 409 MCG tablet Take 800 mcg by mouth daily.   . furosemide (LASIX) 40 MG tablet Take 1 tablet (40 mg total) by mouth daily.  Marland Kitchen KLOR-CON M20 20 MEQ tablet TAKE 1 TABLET DAILY  . levothyroxine (SYNTHROID, LEVOTHROID) 112 MCG tablet Take 112 mcg daily before breakfast by mouth.  . Melatonin 10 MG TABS Take 10 mg by mouth at bedtime.  . metoprolol tartrate (LOPRESSOR) 50 MG tablet TAKE 1 TABLET TWICE A DAY  . vitamin C (ASCORBIC ACID) 500 MG tablet Take 500 mg by mouth 2 (two) times daily.     Allergies:   Patient has no known allergies.   Social History   Socioeconomic History  . Marital status: Single    Spouse name: Not on file  . Number of children: Not on file  . Years of education: Not on file  . Highest education level: Not on file  Occupational History  . Occupation: retired  Tobacco Use  . Smoking status: Never Smoker  . Smokeless tobacco: Never Used  Vaping Use  . Vaping Use: Never used  Substance and Sexual Activity  . Alcohol use: No    Alcohol/week: 0.0 standard drinks  . Drug use: No  . Sexual activity: Not on file  Other Topics Concern  . Not on file  Social History Narrative   Lives in [place] by herself.  Does not routinely exercise.  Works full-time -  on her feet all day at work.   Social Determinants of Health   Financial Resource Strain: Not on file  Food Insecurity: Not on file  Transportation Needs: Not on file  Physical Activity: Not on file  Stress: Not on file  Social Connections: Not on file     Family History: The patient's family history includes Atrial fibrillation in her mother; Colon cancer (age of onset: 36) in her cousin; Diabetes in her father; Heart attack in her mother; Heart failure in her mother; Hypertension in her father and mother; Parkinson's disease in her father;  Rheum arthritis in her mother. There is no history of Breast cancer.  ROS:   Please see the history of present illness.     All other systems reviewed and are negative.  EKGs/Labs/Other Studies Reviewed:    The following studies were reviewed today:  Echo 01/2020 1. Left ventricular ejection fraction, by estimation, is 65 to 70%. The  left ventricle has normal function. Left ventricular endocardial border  not optimally defined to evaluate regional wall motion. There is severe  left ventricular hypertrophy. Left  ventricular diastolic parameters are consistent with Grade II diastolic  dysfunction (pseudonormalization). Elevated left atrial pressure.  2. Right ventricular systolic function is normal. The right ventricular  size is normal.  3. Left atrial size was moderately dilated.  4. Right atrial size was mildly dilated.  5. The mitral valve is grossly normal. No evidence of mitral valve  regurgitation.  6. The aortic valve was not well visualized. Aortic valve regurgitation  is not visualized. No aortic stenosis is present.   EKG:  EKG is ordered today.  The ekg ordered today demonstrates SB, 59bpm, nonspecific T wave changes  Recent Labs: 02/09/2020: TSH 0.727 02/17/2020: Magnesium 2.3 02/20/2020: ALT 26; B Natriuretic Peptide 210.7 02/22/2020: Hemoglobin 9.9; Platelets 213 02/23/2020: BUN 60; Creatinine, Ser 2.03; Potassium 4.9;  Sodium 143  Recent Lipid Panel    Component Value Date/Time   CHOL 122 02/09/2020 1340   TRIG 153 (H) 02/09/2020 1340   HDL 49 02/09/2020 1340   CHOLHDL 2.5 02/09/2020 1340   VLDL 31 02/09/2020 1340   LDLCALC 42 02/09/2020 1340     Risk Assessment/Calculations:     CHA2DS2-VASc Score = 4  This indicates a 4.8% annual risk of stroke. The patient's score is based upon: CHF History: Yes HTN History: Yes Diabetes History: No Stroke History: No Vascular Disease History: Yes Age Score: 0 Gender Score: 1      Physical Exam:    VS:  BP 114/66 (BP Location: Left Arm, Patient Position: Sitting, Cuff Size: Large)   Pulse (!) 59   Ht 5\' 4"  (1.626 m)   Wt (!) 364 lb (165.1 kg)   LMP 07/17/2012   SpO2 97% Comment: on 2L O2  BMI 62.48 kg/m     Wt Readings from Last 3 Encounters:  02/27/20 (!) 364 lb (165.1 kg)  02/24/20 (!) 364 lb 6 oz (165.3 kg)  02/22/20 (!) 361 lb 8.9 oz (164 kg)     GEN:  Obese WF in no acute distress HEENT: Normal NECK: No JVD; No carotid bruits LYMPHATICS: No lymphadenopathy CARDIAC: RRR, no murmurs, rubs, gallops RESPIRATORY:  Clear to auscultation without rales, wheezing or rhonchi  ABDOMEN: Soft, non-tender, non-distended MUSCULOSKELETAL:  No edema; No deformity  SKIN: Warm and dry NEUROLOGIC:  Alert and oriented x 3 PSYCHIATRIC:  Normal affect   ASSESSMENT:    1. Chronic diastolic heart failure ([place])   2. Essential hypertension   3. Paroxysmal A-fib (HCC)   4. Stage 3 chronic kidney disease, unspecified whether stage 3a or 3b CKD ([place])   5. Sleep-disordered breathing   6. Respiratory distress    PLAN:    In order of problems listed above:  Chronic diastolic heart failure/Respiratory distress She was hospitalized twice in the last month for respiratory failure treated for pneumonia and acute heart failure. She was treated with IV lasix and IV antibiotics. She was sent home on supplemental O2 and lasix 40 mg daily. Weights have been  stable since discharge. No significant  swelling on exam. Lungs clear. She reports stable orthopnea. She has been using CPAP or O2 regularly at night. I will check  Check BMET and CBC. Since patient reported O2 levels dropping during exertion recommended she increase to 3-4L as needed. Also recommended she follow-up with pulmonology given history of OSA, PE, and recent pneumonia/respiratory failure. Patient follows with Dr. Halford Chessman.   HTN She was sent home on amlodipine 5mg  daily, clonidine 0.2mg  BID, metoprolol 50 mg BID. BP today 114/66. Continue current regimen.  Paroxysmal Afib S/p DCCV in October 2020. She was in SR during recent hospitalizations. Continue Eliquis for anticoagulation. Metoprolol for rate control. EKG shows SB at 59bpm.  CKD stage 3 Creatinine 2.01 at discharge, which appears to be around baseline. BMET today  OSA Compliant with CPAP/home O2. Recommend pulmonology follow-up as above.   Disposition: Follow up in 3 month(s) with MD/PA   Shared Decision Making/Informed Consent        Signed, Clarice Zulauf Ninfa Meeker, PA-C  02/27/2020 9:37 AM    [place]

## 2020-02-27 NOTE — Patient Instructions (Signed)
Medication Instructions:  No changes  *If you need a refill on your cardiac medications before your next appointment, please call your pharmacy*   Lab Work: BMET, CBC done today  If you have labs (blood work) drawn today and your tests are completely normal, you will receive your results only by: Marland Kitchen MyChart Message (if you have MyChart) OR . A paper copy in the mail If you have any lab test that is abnormal or we need to change your treatment, we will call you to review the results.   Testing/Procedures: None   Follow-Up: At [facility], you and your health needs are our priority.  As part of our continuing mission to provide you with exceptional heart care, we have created designated Provider Care Teams.  These Care Teams include your primary Cardiologist (physician) and Advanced Practice Providers (APPs -  Physician Assistants and Nurse Practitioners) who all work together to provide you with the care you need, when you need it.  Your next appointment:   3 month(s)  The format for your next appointment:   In Person  Provider:   You may see Kathlyn Sacramento, MD or one of the following Advanced Practice Providers on your designated Care Team:    Murray Hodgkins, NP  Christell Faith, PA-C  Marrianne Mood, PA-C  Cadence Abercrombie, [place]  Laurann [place], NP

## 2020-02-28 ENCOUNTER — Telehealth: Payer: Self-pay | Admitting: Nurse Practitioner

## 2020-02-28 LAB — BASIC METABOLIC PANEL
BUN/Creatinine Ratio: 22 (ref 9–23)
BUN: 40 mg/dL — ABNORMAL HIGH (ref 6–24)
CO2: 32 mmol/L — ABNORMAL HIGH (ref 20–29)
Calcium: 9.2 mg/dL (ref 8.7–10.2)
Chloride: 102 mmol/L (ref 96–106)
Creatinine, Ser: 1.83 mg/dL — ABNORMAL HIGH (ref 0.57–1.00)
GFR calc Af Amer: 35 mL/min/{1.73_m2} — ABNORMAL LOW (ref >59)
GFR calc non Af Amer: 30 mL/min/{1.73_m2} — ABNORMAL LOW (ref >59)
Glucose: 149 mg/dL — ABNORMAL HIGH (ref 65–99)
Potassium: 5.1 mmol/L (ref 3.5–5.2)
Sodium: 144 mmol/L (ref 134–144)

## 2020-02-28 LAB — CBC
Hematocrit: 32.2 % — ABNORMAL LOW (ref 34.0–46.6)
Hemoglobin: 9.9 g/dL — ABNORMAL LOW (ref 11.1–15.9)
MCH: 27.3 pg (ref 26.6–33.0)
MCHC: 30.7 g/dL — ABNORMAL LOW (ref 31.5–35.7)
MCV: 89 fL (ref 79–97)
Platelets: 232 10*3/uL (ref 150–450)
RBC: 3.63 x10E6/uL — ABNORMAL LOW (ref 3.77–5.28)
RDW: 13 % (ref 11.7–15.4)
WBC: 10.1 10*3/uL (ref 3.4–10.8)

## 2020-02-28 NOTE — Telephone Encounter (Signed)
   Pt called b/c wt up 5 lbs overnight.  She was seen in office yesterday and felt to be doing well but then had [place] soup, [place] cold cuts w/ swiss cheese sandwich, and 'a few croissants' for dinner last night.  She thinks her feet are a little more swollen, but is otw stable.  She has been having DOE since recent hospitalizations, and this is unchanged.  Labs from yesterday reviewed - renal fxn overall stable.  I rec that she take an additional lasix 40mg  x 1 (she takes 40mg  daily and took her usual daily dose earlier this AM) now and try and avoid high sodium products.  Lab Results  Component Value Date   CREATININE 1.83 (H) 02/27/2020   BUN 40 (H) 02/27/2020   NA 144 02/27/2020   K 5.1 02/27/2020   CL 102 02/27/2020   CO2 32 (H) 02/27/2020     Caller verbalized understanding and was grateful for the call back.  Murray Hodgkins, NP 02/28/2020, 3:22 PM

## 2020-03-01 ENCOUNTER — Ambulatory Visit: Payer: BC Managed Care – PPO | Admitting: Physician Assistant

## 2020-03-02 ENCOUNTER — Ambulatory Visit: Payer: BC Managed Care – PPO | Admitting: Pulmonary Disease

## 2020-03-02 ENCOUNTER — Ambulatory Visit: Payer: BC Managed Care – PPO

## 2020-03-04 ENCOUNTER — Other Ambulatory Visit: Payer: Self-pay

## 2020-03-04 ENCOUNTER — Ambulatory Visit: Payer: BC Managed Care – PPO | Admitting: Physical Therapy

## 2020-03-04 DIAGNOSIS — M6281 Muscle weakness (generalized): Secondary | ICD-10-CM

## 2020-03-04 DIAGNOSIS — R2689 Other abnormalities of gait and mobility: Secondary | ICD-10-CM | POA: Diagnosis not present

## 2020-03-04 DIAGNOSIS — E1122 Type 2 diabetes mellitus with diabetic chronic kidney disease: Secondary | ICD-10-CM | POA: Diagnosis not present

## 2020-03-04 DIAGNOSIS — N1832 Chronic kidney disease, stage 3b: Secondary | ICD-10-CM | POA: Diagnosis not present

## 2020-03-04 NOTE — Therapy (Signed)
[place] [facility] SERVICES [address] [place], [place], [address] Phone: [phone]   Fax:  [phone]  Physical Therapy Treatment  Patient Details  Name: Nicole Schmidt MRN: [id number] Date of Birth: Jan 08, 1966 Referring Provider (PT): [place], [place]   Encounter Date: 03/04/2020   PT End of Session - 03/04/20 1250    Visit Number 2    Number of Visits 16    Date for PT Re-Evaluation 04/21/20    Authorization Type 1/10 eval 02/25/20    PT Start Time 1300    PT Stop Time 1345    PT Time Calculation (min) 45 min    Equipment Utilized During Treatment Gait belt;Oxygen   2 L via nasal cannula   Activity Tolerance Patient limited by fatigue;Other (comment)   Sp02 limitations   Behavior During Therapy [facility] for tasks assessed/performed           Past Medical History:  Diagnosis Date  . (HFpEF) heart failure with preserved ejection fraction ([place])    a. 10/2018 Echo: EF 60-65%, diast dysfxn, RVSP 50.7 mmHg, mildly dil LA.  Marland Kitchen Acquired hypothyroidism 02/09/2020  . Anemia   . Arthritis   . Breast cancer ([place]) 2014   right breast cancer  . Breast cancer of upper-inner quadrant of right female breast ([place])    Right breast invasive CA and DCIS , 7 mm T1,N0,M0. Er/PR pos, her 2 negative.  Margins 1 mm.  . CHF (congestive heart failure) ([place])   . CKD (chronic kidney disease), stage III ([place])   . Diabetes mellitus type 2 in obese ([place]) 02/09/2020  . Diabetes mellitus without complication ([place])   . Dyslipidemia 02/09/2020  . Essential hypertension   . Gout   . Hypothyroidism   . Menopause    age 55  . Morbid obesity ([place])   . Persistent atrial fibrillation ([place])    a. Dx 10/2018 in setting of PNA. CHA2DS2VASc = 4-->Eliquis; b. 11/2018 s/p successful DCCV.  Marland Kitchen Personal history of radiation therapy   . Pulmonary embolism ([place]) 10/2014   a. Chronic eliquis.  . Thyroid goiter     Past Surgical History:  Procedure Laterality Date  .  BREAST BIOPSY Right 2014   breast ca  . BREAST EXCISIONAL BIOPSY Right 07/19/2012   breast ca  . BREAST SURGERY Right 2014   with sentinel node bx subareaolar duct excision  . CARDIOVERSION N/A 11/28/2018   Procedure: CARDIOVERSION;  Surgeon: Minna Merritts, MD;  Location: [facility] ORS;  Service: Cardiovascular;  Laterality: N/A;  . COLONOSCOPY WITH PROPOFOL N/A 01/30/2017   Procedure: COLONOSCOPY WITH PROPOFOL;  Surgeon: Christene Lye, MD;  Location: [facility] ENDOSCOPY;  Service: Endoscopy;  Laterality: N/A;    There were no vitals filed for this visit.   Subjective Assessment - 03/04/20 1530    Subjective Patient denies of any new symptoms or pain since last therapy session. She denies of any falls or injuries.    Pertinent History Patient presents to physical therapy for weakness. Has been to ED twice since Christmas.Was discharged from hospital on 02/23/20.  Patient presents with 2 L of 02 via nasal cannula which is new to her. Is monitoring her Sp02 at home and noticing it is low. PMH includes HFpEF, hypothyroidism, anemia, arthritis, breast cancer, CHF, CKD, DM, Dyslipidemia, HTN, gout, hypothyroidism, morbid obesity, a fib, PE, thyroid goiter.    Limitations Lifting;Standing;Walking;House hold activities;Other (comment)    How long can you sit comfortably? no problem  How long can you stand comfortably? several minutes    How long can you walk comfortably? not sure. gets winded quickly.    Currently in Pain? No/denies         Pre Treatment: HR: 68 SaO2: 98%    There Ex:  Hooklying bridges in Semi-Fowler's position x 10 reps Seated LAQ with 2.5# ankle weights x 20 BLE; Seated clams with GTB x 20 BLE; Seated adductor squeezes with ball x 20 BLE; Seated marches  x 20 BLE; Seated abduction/adduction crossovers with semi foam x 15 reps BLE  Sit to stand from regular height chair without UE support x 10, x 7, x 5  HR: 80 SaO2: 95%      PT Short Term Goals -  02/25/20 1719      PT SHORT TERM GOAL #1   Title Patient will be independent in home exercise program to improve strength/mobility for better functional independence with ADLs.    Baseline 1/12: HEP given    Time 2    Period Weeks    Status New    Target Date 03/10/20             PT Long Term Goals - 02/25/20 1720      PT LONG TERM GOAL #1   Title Patient will increase FOTO score to equal to or greater than   54%  to demonstrate statistically significant improvement in mobility and quality of life.    Baseline 1/12: 45%    Time 8    Period Weeks    Status New    Target Date 04/21/20      PT LONG TERM GOAL #2   Title Patient will increase 10 meter walk test to >1.60m/s as to improve gait speed for better community ambulation and to reduce fall risk while maintaining SP02>90%    Baseline 1/12: 0.7 m/s    Time 8    Period Weeks    Status New    Target Date 04/21/20      PT LONG TERM GOAL #3   Title Patient will increase BLE gross strength to 4+/5 as to improve functional strength for independent gait, increased standing tolerance and increased ADL ability.    Baseline 1/12: see note    Time 8    Period Weeks    Status New    Target Date 04/21/20      PT LONG TERM GOAL #4   Title Patient will ambulate >200 ft without an AD with Sp02>90% for increased functional mobility.    Baseline 1/12: 68 ft wtih Sp02 drop    Time 8    Period Weeks    Status New    Target Date 04/21/20                 Plan - 03/04/20 1539    Clinical Impression Statement Patient completed strengthening exercises with fair tolerance to activity. Patient's SaO2 and HR were all maintained > 90% and 70-85 bpm after completing a set of BLE strengthening activity. Patient demonstrates decreased stride and step length with increased external roation in BLE when ambulating on level surface with 2L of O2.Patient will continue to benefit from skilled physical therapy to improve generalized strength,  ROM, and capacity for functional activity.    Personal Factors and Comorbidities Age;Comorbidity 3+;Finances;Fitness;Past/Current Experience;Transportation    Comorbidities HFpEF, hypothyroidism, anemia, arthritis, breast cancer, CHF, CKD, DM, Dyslipidemia, HTN, gout, hypothyroidism, morbid obesity, a fib, PE, thyroid goiter.    Examination-Activity Limitations Bathing;Bed  Mobility;Bend;Caring for Others;Carry;Dressing;Locomotion Level;Lift;Squat;Stairs;Stand;Toileting;Transfers    Examination-Participation Restrictions Church;Cleaning;Community Activity;Driving;Interpersonal Relationship;Meal Prep;Laundry;Shop;Volunteer;Yard Work    [employer] Evolving/Moderate complexity    Rehab Potential Fair    PT Frequency 2x / week    PT Duration 8 weeks    PT Treatment/Interventions Patient/family education;Therapeutic activities;Therapeutic exercise;Functional mobility training;Balance training;Neuromuscular re-education;ADLs/Self Care Home Management;Aquatic Therapy;Canalith Repostioning;Cryotherapy;Electrical Stimulation;Iontophoresis 4mg /ml Dexamethasone;Moist Heat;Traction;Ultrasound;DME Instruction;Gait training;Stair training;Orthotic Fit/Training;Manual techniques;Dry needling;Taping;Vestibular;Visual/perceptual remediation/compensation    PT Next Visit Plan strengthening , monitor Sp02    PT Home Exercise Plan see above    Consulted and Agree with Plan of Care Patient           Patient will benefit from skilled therapeutic intervention in order to improve the following deficits and impairments:  Abnormal gait,Decreased balance,Decreased endurance,Decreased mobility,Difficulty walking,Obesity,Decreased strength,Cardiopulmonary status limiting activity,Decreased activity tolerance,Decreased safety awareness,Impaired flexibility,Impaired perceived functional ability,Impaired sensation,Postural dysfunction,Improper body mechanics  Visit Diagnosis: Muscle weakness  (generalized)  Other abnormalities of gait and mobility     Problem List Patient Active Problem List   Diagnosis Date Noted  . AF (paroxysmal atrial fibrillation) ([place])   . Acquired thrombophilia ([place])   . Acute on chronic respiratory failure with hypoxia ([place]) 02/21/2020  . Acute on chronic respiratory failure with hypoxemia ([place]) 02/21/2020  . Atrial fibrillation, chronic ([place]) 02/09/2020  . Dyslipidemia 02/09/2020  . Diabetes mellitus type 2 in obese ([place]) 02/09/2020  . Acquired hypothyroidism 02/09/2020  . Thrush, oral 02/09/2020  . Morbid obesity with BMI of 60.0-69.9, adult ([place]) 02/03/2020  . Chronic anticoagulation 12/30/2019  . OSA on CPAP 08/04/2019  . Atrial fibrillation with RVR ([place]) 10/18/2018  . Acute hypoxemic respiratory failure ([place]) 03/29/2018  . Acute on chronic diastolic CHF (congestive heart failure) ([place]) 02/04/2018  . Lymphedema 02/04/2018  . Essential hypertension 12/09/2017  . Long term (current) use of aromatase inhibitors 11/17/2017  . Elevated alkaline phosphatase level 05/20/2017  . Goals of care, counseling/discussion 05/14/2017  . Chronic renal disease, stage III ([place]) 07/08/2015  . Iron deficiency anemia 07/08/2015  . Acute renal failure superimposed on stage 3 chronic kidney disease ([place]) 10/22/2014  . History of breast cancer 10/22/2014  . Elevated troponin 10/22/2014  . Pulmonary emboli ([place]) 10/18/2014  . Pulmonary embolism ([place]) 10/18/2014  . Candidiasis of breast 07/06/2014  . Malignant neoplasm of right female breast [facility]) 07/24/2012   Karl Luke PT, DPT Netta Corrigan 03/04/2020, 3:46 PM  [place] [facility]IN [facility] SERVICES [address] [place], [place], [address] Phone: [phone]   Fax:  [phone]  Name: BEVERLIE KURIHARA MRN: [id number] Date of Birth: 07-28-1965

## 2020-03-05 ENCOUNTER — Ambulatory Visit: Payer: BC Managed Care – PPO | Admitting: Family

## 2020-03-08 DIAGNOSIS — N2581 Secondary hyperparathyroidism of renal origin: Secondary | ICD-10-CM | POA: Diagnosis not present

## 2020-03-08 DIAGNOSIS — E1122 Type 2 diabetes mellitus with diabetic chronic kidney disease: Secondary | ICD-10-CM | POA: Diagnosis not present

## 2020-03-08 DIAGNOSIS — N184 Chronic kidney disease, stage 4 (severe): Secondary | ICD-10-CM | POA: Diagnosis not present

## 2020-03-08 DIAGNOSIS — I10 Essential (primary) hypertension: Secondary | ICD-10-CM | POA: Diagnosis not present

## 2020-03-09 ENCOUNTER — Ambulatory Visit: Payer: BC Managed Care – PPO | Admitting: Physical Therapy

## 2020-03-09 ENCOUNTER — Other Ambulatory Visit: Payer: Self-pay

## 2020-03-09 DIAGNOSIS — R2689 Other abnormalities of gait and mobility: Secondary | ICD-10-CM | POA: Diagnosis not present

## 2020-03-09 DIAGNOSIS — M6281 Muscle weakness (generalized): Secondary | ICD-10-CM

## 2020-03-09 NOTE — Therapy (Signed)
[place] MAIN [facility] SERVICES [address] [place], [place], [address] Phone: [phone]   Fax:  [phone]  Physical Therapy Treatment  Patient Details  Name: Nicole Schmidt MRN: [id number] Date of Birth: 07-29-1965 Referring Provider (PT): [place], [place]   Encounter Date: 03/09/2020   PT End of Session - 03/09/20 1441    Visit Number 3    Number of Visits 16    Date for PT Re-Evaluation 04/21/20    Authorization Type 1/10 eval 02/25/20    PT Start Time 1300    PT Stop Time 1345    PT Time Calculation (min) 45 min    Equipment Utilized During Treatment Gait belt;Oxygen   2 L via nasal cannula   Activity Tolerance Patient limited by fatigue;Other (comment)   Sp02 limitations   Behavior During Therapy [facility] for tasks assessed/performed           Past Medical History:  Diagnosis Date  . (HFpEF) heart failure with preserved ejection fraction ([place])    a. 10/2018 Echo: EF 60-65%, diast dysfxn, RVSP 50.7 mmHg, mildly dil LA.  Marland Kitchen Acquired hypothyroidism 02/09/2020  . Anemia   . Arthritis   . Breast cancer ([place]) 2014   right breast cancer  . Breast cancer of upper-inner quadrant of right female breast ([place])    Right breast invasive CA and DCIS , 7 mm T1,N0,M0. Er/PR pos, her 2 negative.  Margins 1 mm.  . CHF (congestive heart failure) ([place])   . CKD (chronic kidney disease), stage III ([place])   . Diabetes mellitus type 2 in obese ([place]) 02/09/2020  . Diabetes mellitus without complication ([place])   . Dyslipidemia 02/09/2020  . Essential hypertension   . Gout   . Hypothyroidism   . Menopause    age 71  . Morbid obesity ([place])   . Persistent atrial fibrillation ([place])    a. Dx 10/2018 in setting of PNA. CHA2DS2VASc = 4-->Eliquis; b. 11/2018 s/p successful DCCV.  Marland Kitchen Personal history of radiation therapy   . Pulmonary embolism ([place]) 10/2014   a. Chronic eliquis.  . Thyroid goiter     Past Surgical History:  Procedure Laterality Date  .  BREAST BIOPSY Right 2014   breast ca  . BREAST EXCISIONAL BIOPSY Right 07/19/2012   breast ca  . BREAST SURGERY Right 2014   with sentinel node bx subareaolar duct excision  . CARDIOVERSION N/A 11/28/2018   Procedure: CARDIOVERSION;  Surgeon: Minna Merritts, MD;  Location: [facility] ORS;  Service: Cardiovascular;  Laterality: N/A;  . COLONOSCOPY WITH PROPOFOL N/A 01/30/2017   Procedure: COLONOSCOPY WITH PROPOFOL;  Surgeon: Christene Lye, MD;  Location: [facility] ENDOSCOPY;  Service: Endoscopy;  Laterality: N/A;    There were no vitals filed for this visit.   Subjective Assessment - 03/09/20 1303    Subjective No new symptoms or pain reported since last therapy session. Patient states she went to her nephrologist yesterday and is on a 40 ounce fluid restriction in a day.    Pertinent History Patient presents to physical therapy for weakness. Has been to ED twice since Christmas.Was discharged from hospital on 02/23/20.  Patient presents with 2 L of 02 via nasal cannula which is new to her. Is monitoring her Sp02 at home and noticing it is low. PMH includes HFpEF, hypothyroidism, anemia, arthritis, breast cancer, CHF, CKD, DM, Dyslipidemia, HTN, gout, hypothyroidism, morbid obesity, a fib, PE, thyroid goiter.    Limitations Lifting;Standing;Walking;House hold activities;Other (comment)  How long can you sit comfortably? no problem    How long can you stand comfortably? several minutes    How long can you walk comfortably? not sure. gets winded quickly.    Currently in Pain? No/denies            Pre Treatment: HR: 62 SaO2: 98%   Nu-Step x L0 x 5 mins x no arms; required breaks due to muscle fatigue   All exercises were completed with 2.5# ankle weight:  There Ex:  Seated LAQ with 2.5# ankle weights x 20 BLE; Seated adductor squeezes with ball x 20 BLE; Seated marches  x 20 BLE; Seated abduction/adduction crossovers with semi foam x 15 reps BLE Standing hip abduction x 20  BLE Standing hamstring curl x 20 BLE   Sit to stand from regular height chair without UE support x 10, x 7, x 5  HR: 80 SaO2: 95%  Ambulation: 65 ft, SaO2 90%, 72 HR  Clinical Impression  Patient completed strengthening exercises with fair tolerance to activity. Progressed patient into more weight bearing exercises to improve patient's functional activity tolerance with repeated seated rest breaks due to muscle fatigue. Patient's SaO2 and HR were checked after each exercise with values being > 90% of SaO2 and HR ranging from 60-70 bpm. Patient ambulated on level surface for 65 feet while being on 2L of continuous O2 via nasal canula with WBOS, increased external rotation in BLE, and shorter step and stride length. Patient reports of muscle fatigue and SOB upon exertion and is limiting her to ambulate for longer distances. Patient will continue to benefit from skilled physical therapy to improve generalized strength, ROM, and capacity for functional activity.        PT Short Term Goals - 02/25/20 1719      PT SHORT TERM GOAL #1   Title Patient will be independent in home exercise program to improve strength/mobility for better functional independence with ADLs.    Baseline 1/12: HEP given    Time 2    Period Weeks    Status New    Target Date 03/10/20             PT Long Term Goals - 02/25/20 1720      PT LONG TERM GOAL #1   Title Patient will increase FOTO score to equal to or greater than   54%  to demonstrate statistically significant improvement in mobility and quality of life.    Baseline 1/12: 45%    Time 8    Period Weeks    Status New    Target Date 04/21/20      PT LONG TERM GOAL #2   Title Patient will increase 10 meter walk test to >1.8m/s as to improve gait speed for better community ambulation and to reduce fall risk while maintaining SP02>90%    Baseline 1/12: 0.7 m/s    Time 8    Period Weeks    Status New    Target Date 04/21/20      PT LONG TERM GOAL  #3   Title Patient will increase BLE gross strength to 4+/5 as to improve functional strength for independent gait, increased standing tolerance and increased ADL ability.    Baseline 1/12: see note    Time 8    Period Weeks    Status New    Target Date 04/21/20      PT LONG TERM GOAL #4   Title Patient will ambulate >200 ft without an AD  with Sp02>90% for increased functional mobility.    Baseline 1/12: 68 ft wtih Sp02 drop    Time 8    Period Weeks    Status New    Target Date 04/21/20                 Plan - 03/09/20 1444    Clinical Impression Statement Patient completed strengthening exercises with fair tolerance to activity. Progressed patient into more weight bearing exercises to improve patient's functional activity tolerance with repeated seated rest breaks due to muscle fatigue. Patient's SaO2 and HR were checked after each exercise with values being > 90% of SaO2 and HR ranging from 60-70 bpm. Patient ambulated on level surface for 65 feet while being on 2L of continuous O2 via nasal canula with WBOS, increased external rotation in BLE, and shorter step and stride length. Patient reports of muscle fatigue and SOB upon exertion and is limiting her to ambulate for longer distances. Patient will continue to benefit from skilled physical therapy to improve generalized strength, ROM, and capacity for functional activity.    Personal Factors and Comorbidities Age;Comorbidity 3+;Finances;Fitness;Past/Current Experience;Transportation    Comorbidities HFpEF, hypothyroidism, anemia, arthritis, breast cancer, CHF, CKD, DM, Dyslipidemia, HTN, gout, hypothyroidism, morbid obesity, a fib, PE, thyroid goiter.    Examination-Activity Limitations Bathing;Bed Mobility;Bend;Caring for Others;Carry;Dressing;Locomotion Level;Lift;Squat;Stairs;Stand;Toileting;Transfers    Examination-Participation Restrictions Church;Cleaning;Community Activity;Driving;Interpersonal Relationship;Meal  Prep;Laundry;Shop;Volunteer;Yard Work    [employer]chant [employer] officer Evolving/Moderate complexity    Rehab Potential Fair    PT Frequency 2x / week    PT Duration 8 weeks    PT Treatment/Interventions Patient/family education;Therapeutic activities;Therapeutic exercise;Functional mobility training;Balance training;Neuromuscular re-education;ADLs/Self Care Home Management;Aquatic Therapy;Canalith Repostioning;Cryotherapy;Electrical Stimulation;Iontophoresis 4mg /ml Dexamethasone;Moist Heat;Traction;Ultrasound;DME Instruction;Gait training;Stair training;Orthotic Fit/Training;Manual techniques;Dry needling;Taping;Vestibular;Visual/perceptual remediation/compensation    PT Next Visit Plan strengthening , monitor Sp02    PT Home Exercise Plan see above    Consulted and Agree with Plan of Care Patient           Patient will benefit from skilled therapeutic intervention in order to improve the following deficits and impairments:  Abnormal gait,Decreased balance,Decreased endurance,Decreased mobility,Difficulty walking,Obesity,Decreased strength,Cardiopulmonary status limiting activity,Decreased activity tolerance,Decreased safety awareness,Impaired flexibility,Impaired perceived functional ability,Impaired sensation,Postural dysfunction,Improper body mechanics  Visit Diagnosis: Muscle weakness (generalized)  Other abnormalities of gait and mobility     Problem List Patient Active Problem List   Diagnosis Date Noted  . AF (paroxysmal atrial fibrillation) ([place])   . Acquired thrombophilia ([place])   . Acute on chronic respiratory failure with hypoxia ([place]) 02/21/2020  . Acute on chronic respiratory failure with hypoxemia ([place]) 02/21/2020  . Atrial fibrillation, chronic ([place]) 02/09/2020  . Dyslipidemia 02/09/2020  . Diabetes mellitus type 2 in obese ([place]) 02/09/2020  . Acquired hypothyroidism 02/09/2020  . Thrush, oral 02/09/2020  . Morbid obesity with BMI of 60.0-69.9, adult ([place])  02/03/2020  . Chronic anticoagulation 12/30/2019  . OSA on CPAP 08/04/2019  . Atrial fibrillation with RVR ([place]) 10/18/2018  . Acute hypoxemic respiratory failure ([place]) 03/29/2018  . Acute on chronic diastolic CHF (congestive heart failure) ([place]) 02/04/2018  . Lymphedema 02/04/2018  . Essential hypertension 12/09/2017  . Long term (current) use of aromatase inhibitors 11/17/2017  . Elevated alkaline phosphatase level 05/20/2017  . Goals of care, counseling/discussion 05/14/2017  . Chronic renal disease, stage III ([place]) 07/08/2015  . Iron deficiency anemia 07/08/2015  . Acute renal failure superimposed on stage 3 chronic kidney disease ([place]) 10/22/2014  . History of breast cancer 10/22/2014  . Elevated troponin 10/22/2014  . Pulmonary  emboli ([place]) 10/18/2014  . Pulmonary embolism ([place]) 10/18/2014  . Candidiasis of breast 07/06/2014  . Malignant neoplasm of right female breast [facility]) 07/24/2012   Karl Luke PT, DPT Netta Corrigan 03/09/2020, 2:49 PM  [place] MAIN [facility] SERVICES [address] [place], [place], [address] Phone: [phone]   Fax:  [phone]  Name: Nicole Schmidt MRN: [id number] Date of Birth: Apr 13, 1965

## 2020-03-11 ENCOUNTER — Other Ambulatory Visit: Payer: Self-pay

## 2020-03-11 ENCOUNTER — Ambulatory Visit: Payer: BC Managed Care – PPO | Admitting: Physical Therapy

## 2020-03-11 DIAGNOSIS — M6281 Muscle weakness (generalized): Secondary | ICD-10-CM

## 2020-03-11 DIAGNOSIS — R2689 Other abnormalities of gait and mobility: Secondary | ICD-10-CM

## 2020-03-11 NOTE — Therapy (Signed)
[place] MAIN [facility] SERVICES [address] [place], [place], [address] Phone: [phone]   Fax:  [phone]  Physical Therapy Treatment  Patient Details  Name: Nicole Schmidt MRN: [id number] Date of Birth: [birth year] Referring Provider (PT): [place], [place]   Encounter Date: 03/11/2020   PT End of Session - 03/11/20 1433    Visit Number 4    Number of Visits 16    Date for PT Re-Evaluation 04/21/20    Authorization Type 1/10 eval 02/25/20    PT Start Time 1430    PT Stop Time 1520    PT Time Calculation (min) 50 min    Equipment Utilized During Treatment Gait belt;Oxygen   2 L via nasal cannula   Activity Tolerance Patient limited by fatigue;Other (comment)   Sp02 limitations   Behavior During Therapy [facility] for tasks assessed/performed           Past Medical History:  Diagnosis Date  . (HFpEF) heart failure with preserved ejection fraction ([place])    a. 10/2018 Echo: EF 60-65%, diast dysfxn, RVSP 50.7 mmHg, mildly dil LA.  Marland Kitchen Acquired hypothyroidism 02/09/2020  . Anemia   . Arthritis   . Breast cancer ([place]) 2014   right breast cancer  . Breast cancer of upper-inner quadrant of right female breast ([place])    Right breast invasive CA and DCIS , 7 mm T1,N0,M0. Er/PR pos, her 2 negative.  Margins 1 mm.  . CHF (congestive heart failure) ([place])   . CKD (chronic kidney disease), stage III ([place])   . Diabetes mellitus type 2 in obese ([place]) 02/09/2020  . Diabetes mellitus without complication ([place])   . Dyslipidemia 02/09/2020  . Essential hypertension   . Gout   . Hypothyroidism   . Menopause    age 55  . Morbid obesity ([place])   . Persistent atrial fibrillation ([place])    a. Dx 10/2018 in setting of PNA. CHA2DS2VASc = 4-->Eliquis; b. 11/2018 s/p successful DCCV.  Marland Kitchen Personal history of radiation therapy   . Pulmonary embolism ([place]) 10/2014   a. Chronic eliquis.  . Thyroid goiter     Past Surgical History:  Procedure Laterality Date  .  BREAST BIOPSY Right 2014   breast ca  . BREAST EXCISIONAL BIOPSY Right 07/19/2012   breast ca  . BREAST SURGERY Right 2014   with sentinel node bx subareaolar duct excision  . CARDIOVERSION N/A 11/28/2018   Procedure: CARDIOVERSION;  Surgeon: Minna Merritts, MD;  Location: [facility] ORS;  Service: Cardiovascular;  Laterality: N/A;  . COLONOSCOPY WITH PROPOFOL N/A 01/30/2017   Procedure: COLONOSCOPY WITH PROPOFOL;  Surgeon: Christene Lye, MD;  Location: [facility] ENDOSCOPY;  Service: Endoscopy;  Laterality: N/A;    There were no vitals filed for this visit.   Subjective Assessment - 03/11/20 1529    Subjective Patient reports that her oxygen tank has shortened because of the shortage oxygen tanks at her oxygen tank supplier. She states she has to limit her outdoor activities due to the shortage of oxygen tank. She denies of any new pain or symptoms since last therapy session.    Pertinent History Patient presents to physical therapy for weakness. Has been to ED twice since Christmas.Was discharged from hospital on 02/23/20.  Patient presents with 2 L of 02 via nasal cannula which is new to her. Is monitoring her Sp02 at home and noticing it is low. PMH includes HFpEF, hypothyroidism, anemia, arthritis, breast cancer, CHF, CKD, DM, Dyslipidemia, HTN, gout,  hypothyroidism, morbid obesity, a fib, PE, thyroid goiter.    Limitations Lifting;Standing;Walking;House hold activities;Other (comment)    How long can you sit comfortably? no problem    How long can you stand comfortably? several minutes    How long can you walk comfortably? not sure. gets winded quickly.    Currently in Pain? No/denies           Pre Treatment: HR: 63 SaO2: 99%   Nu-Step x L0 x 5 mins x no arms; required breaks due to muscle fatigue   All exercises were completed with 3# ankle weight:  There Ex:  Seated LAQ x 20 BLE; Seated marches  x 20 BLE; Seated abduction/adduction crossovers with semi foam x 15 reps  BLE Seated kicking small green theraball x few mins  Standing hip abduction x 20 BLE Standing hamstring curl x 20 BLE  Standing mini squats with BUE support x 20 reps Standing heel raises with BUE support x 20 reps    Sit to stand from regular height chair without UE support x 10, x 7, x 5  HR: 66 SaO2: 96%  Ambulation: 155 ft x 1, SaO2 92%, 82 HR                              PT Short Term Goals - 02/25/20 1719      PT SHORT TERM GOAL #1   Title Patient will be independent in home exercise program to improve strength/mobility for better functional independence with ADLs.    Baseline 1/12: HEP given    Time 2    Period Weeks    Status New    Target Date 03/10/20             PT Long Term Goals - 02/25/20 1720      PT LONG TERM GOAL #1   Title Patient will increase FOTO score to equal to or greater than   54%  to demonstrate statistically significant improvement in mobility and quality of life.    Baseline 1/12: 45%    Time 8    Period Weeks    Status New    Target Date 04/21/20      PT LONG TERM GOAL #2   Title Patient will increase 10 meter walk test to >1.80m/s as to improve gait speed for better community ambulation and to reduce fall risk while maintaining SP02>90%    Baseline 1/12: 0.7 m/s    Time 8    Period Weeks    Status New    Target Date 04/21/20      PT LONG TERM GOAL #3   Title Patient will increase BLE gross strength to 4+/5 as to improve functional strength for independent gait, increased standing tolerance and increased ADL ability.    Baseline 1/12: see note    Time 8    Period Weeks    Status New    Target Date 04/21/20      PT LONG TERM GOAL #4   Title Patient will ambulate >200 ft without an AD with Sp02>90% for increased functional mobility.    Baseline 1/12: 68 ft wtih Sp02 drop    Time 8    Period Weeks    Status New    Target Date 04/21/20                 Plan - 03/11/20 1532    Clinical  Impression Statement Patient completed strength  training with fair tolerance to activity. Progressed patient by increasing ankle weight to improve patient's muscle recruitment and strength with minimal to no pain. Patient demonstrates improvement in standing exercises as she was able to complete more exercises before having to take a seated rest break. She ambulated for 155 ft with continous 2L of O2 via nasal canula and had a reading of SaO2 of 92% and HR of 82 bpm. Patient will continue to benefit from skilled physical therapy to improve generalized strength, ROM, and capacity for functional activity.    Personal Factors and Comorbidities Age;Comorbidity 3+;Finances;Fitness;Past/Current Experience;Transportation    Comorbidities HFpEF, hypothyroidism, anemia, arthritis, breast cancer, CHF, CKD, DM, Dyslipidemia, HTN, gout, hypothyroidism, morbid obesity, a fib, PE, thyroid goiter.    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    Rehab Potential Fair    PT Frequency 2x / week    PT Duration 8 weeks    PT Treatment/Interventions Patient/family education;Therapeutic activities;Therapeutic exercise;Functional mobility training;Balance training;Neuromuscular re-education;ADLs/Self Care Home Management;Aquatic Therapy;Canalith Repostioning;Cryotherapy;Electrical Stimulation;Iontophoresis 4mg /ml Dexamethasone;Moist Heat;Traction;Ultrasound;DME Instruction;Gait training;Stair training;Orthotic Fit/Training;Manual techniques;Dry needling;Taping;Vestibular;Visual/perceptual remediation/compensation    PT Next Visit Plan strengthening , monitor Sp02    PT Home Exercise Plan see above     Consulted and Agree with Plan of Care Patient           Patient will benefit from skilled therapeutic intervention in order to improve the following deficits and impairments:  Abnormal gait,Decreased balance,Decreased endurance,Decreased mobility,Difficulty walking,Obesity,Decreased strength,Cardiopulmonary status limiting activity,Decreased activity tolerance,Decreased safety awareness,Impaired flexibility,Impaired perceived functional ability,Impaired sensation,Postural dysfunction,Improper body mechanics  Visit Diagnosis: Muscle weakness (generalized)  Other abnormalities of gait and mobility     Problem List Patient Active Problem List   Diagnosis Date Noted  . AF (paroxysmal atrial fibrillation) ([place])   . Acquired thrombophilia ([place])   . Acute on chronic respiratory failure with hypoxia ([place]) 02/21/2020  . Acute on chronic respiratory failure with hypoxemia ([place]) 02/21/2020  . Atrial fibrillation, chronic ([place]) 02/09/2020  . Dyslipidemia 02/09/2020  . Diabetes mellitus type 2 in obese ([place]) 02/09/2020  . Acquired hypothyroidism 02/09/2020  . Thrush, oral 02/09/2020  . Morbid obesity with BMI of 60.0-69.9, adult ([place]) 02/03/2020  . Chronic anticoagulation 12/30/2019  . OSA on CPAP 08/04/2019  . Atrial fibrillation with RVR ([place]) 10/18/2018  . Acute hypoxemic respiratory failure ([place]) 03/29/2018  . Acute on chronic diastolic CHF (congestive heart failure) ([place]) 02/04/2018  . Lymphedema 02/04/2018  . Essential hypertension 12/09/2017  . Long term (current) use of aromatase inhibitors 11/17/2017  . Elevated alkaline phosphatase level 05/20/2017  . Goals of care, counseling/discussion 05/14/2017  . Chronic renal disease, stage III ([place]) 07/08/2015  . Iron deficiency anemia 07/08/2015  . Acute renal failure superimposed on stage 3 chronic kidney disease ([place]) 10/22/2014  . History of breast cancer 10/22/2014  . Elevated troponin 10/22/2014  . Pulmonary emboli ([place])  10/18/2014  . Pulmonary embolism ([place]) 10/18/2014  . Candidiasis of breast 07/06/2014  . Malignant neoplasm of right female breast [facility]) 07/24/2012   Karl Luke PT, DPT Netta Corrigan 03/11/2020, 3:35 PM  [place] MAIN [facility] SERVICES [address] [place], [place], [address] Phone: [phone]   Fax:  [phone]  Name: Nicole Schmidt MRN: [id number] Date of Birth: 22-Oct-1965

## 2020-03-16 ENCOUNTER — Ambulatory Visit
Admission: RE | Admit: 2020-03-16 | Discharge: 2020-03-16 | Disposition: A | Discharge status: Home / Self Care | Payer: BC Managed Care – PPO | Source: Ambulatory Visit | Attending: Pulmonary Disease | Admitting: Pulmonary Disease

## 2020-03-16 ENCOUNTER — Other Ambulatory Visit: Payer: Self-pay

## 2020-03-16 ENCOUNTER — Ambulatory Visit: Payer: BC Managed Care – PPO | Attending: Endocrinology | Admitting: Physical Therapy

## 2020-03-16 ENCOUNTER — Encounter: Payer: Self-pay | Admitting: Pulmonary Disease

## 2020-03-16 ENCOUNTER — Ambulatory Visit: Payer: BC Managed Care – PPO | Admitting: Pulmonary Disease

## 2020-03-16 VITALS — BP 126/78 | HR 62 | Temp 98.2°F | Ht 64.0 in | Wt 355.0 lb

## 2020-03-16 DIAGNOSIS — R918 Other nonspecific abnormal finding of lung field: Secondary | ICD-10-CM | POA: Insufficient documentation

## 2020-03-16 DIAGNOSIS — Z9989 Dependence on other enabling machines and devices: Secondary | ICD-10-CM

## 2020-03-16 DIAGNOSIS — N200 Calculus of kidney: Secondary | ICD-10-CM | POA: Diagnosis not present

## 2020-03-16 DIAGNOSIS — E039 Hypothyroidism, unspecified: Secondary | ICD-10-CM | POA: Diagnosis not present

## 2020-03-16 DIAGNOSIS — I517 Cardiomegaly: Secondary | ICD-10-CM | POA: Diagnosis not present

## 2020-03-16 DIAGNOSIS — E785 Hyperlipidemia, unspecified: Secondary | ICD-10-CM | POA: Diagnosis not present

## 2020-03-16 DIAGNOSIS — E1165 Type 2 diabetes mellitus with hyperglycemia: Secondary | ICD-10-CM | POA: Diagnosis not present

## 2020-03-16 DIAGNOSIS — J9811 Atelectasis: Secondary | ICD-10-CM | POA: Diagnosis not present

## 2020-03-16 DIAGNOSIS — M6281 Muscle weakness (generalized): Secondary | ICD-10-CM | POA: Insufficient documentation

## 2020-03-16 DIAGNOSIS — Z86711 Personal history of pulmonary embolism: Secondary | ICD-10-CM

## 2020-03-16 DIAGNOSIS — Z6841 Body Mass Index (BMI) 40.0 and over, adult: Secondary | ICD-10-CM

## 2020-03-16 DIAGNOSIS — C50919 Malignant neoplasm of unspecified site of unspecified female breast: Secondary | ICD-10-CM | POA: Diagnosis not present

## 2020-03-16 DIAGNOSIS — J9621 Acute and chronic respiratory failure with hypoxia: Secondary | ICD-10-CM | POA: Diagnosis not present

## 2020-03-16 DIAGNOSIS — I5033 Acute on chronic diastolic (congestive) heart failure: Secondary | ICD-10-CM | POA: Diagnosis not present

## 2020-03-16 DIAGNOSIS — N183 Chronic kidney disease, stage 3 unspecified: Secondary | ICD-10-CM | POA: Diagnosis not present

## 2020-03-16 DIAGNOSIS — E041 Nontoxic single thyroid nodule: Secondary | ICD-10-CM | POA: Diagnosis not present

## 2020-03-16 DIAGNOSIS — R2689 Other abnormalities of gait and mobility: Secondary | ICD-10-CM | POA: Insufficient documentation

## 2020-03-16 DIAGNOSIS — R5381 Other malaise: Secondary | ICD-10-CM | POA: Insufficient documentation

## 2020-03-16 DIAGNOSIS — R21 Rash and other nonspecific skin eruption: Secondary | ICD-10-CM | POA: Diagnosis not present

## 2020-03-16 DIAGNOSIS — G4733 Obstructive sleep apnea (adult) (pediatric): Secondary | ICD-10-CM

## 2020-03-16 DIAGNOSIS — J189 Pneumonia, unspecified organism: Secondary | ICD-10-CM | POA: Diagnosis not present

## 2020-03-16 DIAGNOSIS — I10 Essential (primary) hypertension: Secondary | ICD-10-CM | POA: Diagnosis not present

## 2020-03-16 DIAGNOSIS — D352 Benign neoplasm of pituitary gland: Secondary | ICD-10-CM | POA: Diagnosis not present

## 2020-03-16 DIAGNOSIS — E063 Autoimmune thyroiditis: Secondary | ICD-10-CM | POA: Diagnosis not present

## 2020-03-16 NOTE — Patient Instructions (Addendum)
You were seen today by [name], NP  for:   1. OSA on CPAP  We recommend that you resume using your CPAP daily >>>Keep up the hard work using your device >>> Goal should be wearing this for the entire night that you are sleeping, at least 4 to 6 hours  Remember:  . Do not drive or operate heavy machinery if tired or drowsy.  . Please notify the supply company and office if you are unable to use your device regularly due to missing supplies or machine being broken.  . Work on maintaining a healthy weight and following your recommended nutrition plan  . Maintain proper daily exercise and movement  . Maintaining proper use of your device can also help improve management of other chronic illnesses such as: Blood pressure, blood sugars, and weight management.   BiPAP/ CPAP Cleaning:  >>>Clean weekly, with Dawn soap, and bottle brush.  Set up to air dry. >>> Wipe mask out daily with wet wipe or towelette    2. Abnormal findings on diagnostic imaging of lung  - DG Chest 2 View; Future  3. Acute on chronic respiratory failure with hypoxia (HCC)  Walk today in office - required 2L with exertion   Continue oxygen therapy as prescribed  >>>maintain oxygen saturations greater than 88 percent  >>>if unable to maintain oxygen saturations please contact the office  >>>do not smoke with oxygen  >>>can use nasal saline gel or nasal saline rinses to moisturize nose if oxygen causes dryness   4. Acute on chronic diastolic CHF (congestive heart failure) ([place])  Remain adherent to your Lasix  Continue follow-up with cardiology  5. Morbid obesity with BMI of 60.0-69.9, adult ([place])  Continue to work with primary care on working to reduce her BMI  Could consider referral to medical weight management, I would encourage discussing this with primary care  6. Physical deconditioning  Walk today in office  Continue to work with physical therapy  Work on increasing your overall physical  activity to work to reduce your BMI as well as increase your overall physical conditioning   We recommend today:  Orders Placed This Encounter  Procedures  . DG Chest 2 View    Standing Status:   Future    Standing Expiration Date:   07/14/2020    Order Specific Question:   Reason for Exam (SYMPTOM  OR DIAGNOSIS REQUIRED)    Answer:   f/u - multifocal pnu    Order Specific Question:   Preferred imaging location?    Answer:   [facility]    Order Specific Question:   Radiology Contrast Protocol - do NOT remove file path    Answer:   [email or web address]   Orders Placed This Encounter  Procedures  . DG Chest 2 View   No orders of the defined types were placed in this encounter.   Follow Up:    Return in about 3 months (around 06/13/2020), or if symptoms worsen or fail to improve, for [facility].   Notification of test results are managed in the following manner: If there are  any recommendations or changes to the  plan of care discussed in office today,  we will contact you and let you know what they are. If you do not hear from [name], then your results are normal and you can view them through your  MyChart account , or a letter will be sent to you. Thank you again for trusting [name] with  your care  - Thank you, [facility]    It is flu season:   >>> Best ways to protect herself from the flu: Receive the yearly flu vaccine, practice good hand hygiene washing with soap and also using hand sanitizer when available, eat a nutritious meals, get adequate rest, hydrate appropriately       Please contact the office if your symptoms worsen or you have concerns that you are not improving.   Thank you for choosing [name] for your healthcare, and for allowing [name] to partner with you on your healthcare journey. I am thankful to be able to provide care to you today.   Nicole Quaker  FNP-C    Exercises to do While Sitting  Exercises that you do while sitting (chair exercises) can give you many of the same benefits as full exercise. Benefits include strengthening your heart, burning calories, and keeping muscles and joints healthy. Exercise can also improve your mood and help with depression and anxiety. You may benefit from chair exercises if you are unable to do standing exercises because of:  Diabetic foot pain.  Obesity.  Illness.  Arthritis.  Recovery from surgery or injury.  Breathing problems.  Balance problems.  Another type of disability. Before starting chair exercises, check with your health care provider or a physical therapist to find out how much exercise you can tolerate and which exercises are safe for you. If your health care provider approves:  Start out slowly and build up over time. Aim to work up to about 10-20 minutes for each exercise session.  Make exercise part of your daily routine.  Drink water when you exercise. Do not wait until you are thirsty. Drink every 10-15 minutes.  Stop exercising right away if you have pain, nausea, shortness of breath, or dizziness.  If you are exercising in a wheelchair, make sure to lock the wheels.  Ask your health care provider whether you can do tai chi or yoga. Many positions in these mind-body exercises can be modified to do while seated. Warm-up Before starting other exercises: 1. Sit up as straight as you can. Have your knees bent at 90 degrees, which is the shape of the capital letter "L." Keep your feet flat on the floor. 2. Sit at the front edge of your chair, if you can. 3. Pull in (tighten) the muscles in your abdomen and stretch your spine and neck as straight as you can. Hold this position for a few minutes. 4. Breathe in and out evenly. Try to concentrate on your breathing, and relax your mind. Stretching Exercise A: Arm stretch 1. Hold your arms out straight in front of your  body. 2. Bend your hands at the wrist with your fingers pointing up, as if signaling someone to stop. Notice the slight tension in your forearms as you hold the position. 3. Keeping your arms out and your hands bent, rotate your hands outward as far as you can and hold this stretch. Aim to have your thumbs pointing up and your pinkie fingers pointing down. Slowly repeat arm stretches for one minute as tolerated. Exercise B: Leg stretch 1. If you can move your legs, try to "draw" letters on the floor with the toes of your foot. Write your name with one foot. 2. Write your name with the toes of your other foot. Slowly repeat the movements for one minute as tolerated. Exercise C: Reach for the sky 1. Reach your hands as far over your head  as you can to stretch your spine. 2. Move your hands and arms as if you are climbing a rope. Slowly repeat the movements for one minute as tolerated. Range of motion exercises Exercise A: Shoulder roll 1. Let your arms hang loosely at your sides. 2. Lift just your shoulders up toward your ears, then let them relax back down. 3. When your shoulders feel loose, rotate your shoulders in backward and forward circles. Do shoulder rolls slowly for one minute as tolerated. Exercise B: March in place 1. As if you are marching, pump your arms and lift your legs up and down. Lift your knees as high as you can. ? If you are unable to lift your knees, just pump your arms and move your ankles and feet up and down. March in place for one minute as tolerated. Exercise C: Seated jumping jacks 1. Let your arms hang down straight. 2. Keeping your arms straight, lift them up over your head. Aim to point your fingers to the ceiling. 3. While you lift your arms, straighten your legs and slide your heels along the floor to your sides, as wide as you can. 4. As you bring your arms back down to your sides, slide your legs back together. ? If you are unable to use your legs, just  move your arms. Slowly repeat seated jumping jacks for one minute as tolerated. Strengthening exercises Exercise A: Shoulder squeeze 1. Hold your arms straight out from your body to your sides, with your elbows bent and your fists pointed at the ceiling. 2. Keeping your arms in the bent position, move them forward so your elbows and forearms meet in front of your face. 3. Open your arms back out as wide as you can with your elbows still bent, until you feel your shoulder blades squeezing together. Hold for 5 seconds. Slowly repeat the movements forward and backward for one minute as tolerated. Contact a health care provider if you:  Had to stop exercising due to any of the following: ? Pain. ? Nausea. ? Shortness of breath. ? Dizziness. ? Fatigue.  Have significant pain or soreness after exercising. Get help right away if you have:  Chest pain.  Difficulty breathing. These symptoms may represent a serious problem that is an emergency. Do not wait to see if the symptoms will go away. Get medical help right away. Call your local emergency services (911 in the [place] Do not drive yourself to the hospital. This information is not intended to replace advice given to you by your health care provider. Make sure you discuss any questions you have with your health care provider. Document Revised: 05/29/2019 Document Reviewed: 05/29/2019 Elsevier Patient Education  2021 [place].  Sleep Apnea Sleep apnea affects breathing during sleep. It causes breathing to stop for a short time or to become shallow. It can also increase the risk of:  Heart attack.  Stroke.  Being very overweight (obese).  Diabetes.  Heart failure.  Irregular heartbeat. The goal of treatment is to help you breathe normally again. What are the causes? There are three kinds of sleep apnea:  Obstructive sleep apnea. This is caused by a blocked or collapsed airway.  Central sleep apnea. This happens when the  brain does not send the right signals to the muscles that control breathing.  Mixed sleep apnea. This is a combination of obstructive and central sleep apnea. The most common cause of this condition is a collapsed or blocked airway. This can happen if:  Your throat muscles are too relaxed.  Your tongue and tonsils are too large.  You are overweight.  Your airway is too small.   What increases the risk?  Being overweight.  Smoking.  Having a small airway.  Being older.  Being female.  Drinking alcohol.  Taking medicines to calm yourself (sedatives or tranquilizers).  Having family members with the condition. What are the signs or symptoms?  Trouble staying asleep.  Being sleepy or tired during the day.  Getting angry a lot.  Loud snoring.  Headaches in the morning.  Not being able to focus your mind (concentrate).  Forgetting things.  Less interest in sex.  Mood swings.  Personality changes.  Feelings of sadness (depression).  Waking up a lot during the night to pee (urinate).  Dry mouth.  Sore throat. How is this diagnosed?  Your medical history.  A physical exam.  A test that is done when you are sleeping (sleep study). The test is most often done in a sleep lab but may also be done at home. How is this treated?  Sleeping on your side.  Using a medicine to get rid of mucus in your nose (decongestant).  Avoiding the use of alcohol, medicines to help you relax, or certain pain medicines (narcotics).  Losing weight, if needed.  Changing your diet.  Not smoking.  Using a machine to open your airway while you sleep, such as: ? An oral appliance. This is a mouthpiece that shifts your lower jaw forward. ? A CPAP device. This device blows air through a mask when you breathe out (exhale). ? An EPAP device. This has valves that you put in each nostril. ? A BPAP device. This device blows air through a mask when you breathe in (inhale) and breathe  out.  Having surgery if other treatments do not work. It is important to get treatment for sleep apnea. Without treatment, it can lead to:  High blood pressure.  Coronary artery disease.  In men, not being able to have an erection (impotence).  Reduced thinking ability.   Follow these instructions at home: Lifestyle  Make changes that your doctor recommends.  Eat a healthy diet.  Lose weight if needed.  Avoid alcohol, medicines to help you relax, and some pain medicines.  Do not use any products that contain nicotine or tobacco, such as cigarettes, e-cigarettes, and chewing tobacco. If you need help quitting, ask your doctor. General instructions  Take over-the-counter and prescription medicines only as told by your doctor.  If you were given a machine to use while you sleep, use it only as told by your doctor.  If you are having surgery, make sure to tell your doctor you have sleep apnea. You may need to bring your device with you.  Keep all follow-up visits as told by your doctor. This is important. Contact a doctor if:  The machine that you were given to use during sleep bothers you or does not seem to be working.  You do not get better.  You get worse. Get help right away if:  Your chest hurts.  You have trouble breathing in enough air.  You have an uncomfortable feeling in your back, arms, or stomach.  You have trouble talking.  One side of your body feels weak.  A part of your face is hanging down. These symptoms may be an emergency. Do not wait to see if the symptoms will go away. Get medical help right away. Call your local   emergency services (911 in the [place] Do not drive yourself to the hospital. Summary  This condition affects breathing during sleep.  The most common cause is a collapsed or blocked airway.  The goal of treatment is to help you breathe normally while you sleep. This information is not intended to replace advice given to you by  your health care provider. Make sure you discuss any questions you have with your health care provider. Document Revised: 11/16/2017 Document Reviewed: 09/25/2017 Elsevier Patient Education  2021 [employer]       

## 2020-03-16 NOTE — Progress Notes (Signed)
Reviewed and agree with assessment/plan.   Chesley Mires, MD [facility] 03/16/2020, 1:23 PM Pager:  [phone]

## 2020-03-16 NOTE — Assessment & Plan Note (Signed)
Stable today Walk today in office patient did require 2 L of O2 with physical exertion Physically deconditioned  Plan: Continue 2 L of O2 with physical exertion Continue 2 L of O2 at night through CPAP

## 2020-03-16 NOTE — Assessment & Plan Note (Signed)
Plan: Continue to work with physical therapy Work to reduce your BMI

## 2020-03-16 NOTE — Assessment & Plan Note (Signed)
Plan: Continue diuretics Continue follow-up with cardiology Continue follow-up with nephrology

## 2020-03-16 NOTE — Assessment & Plan Note (Signed)
BMI 60.9  Plan: Continue to work with primary care and working to reduce your BMI

## 2020-03-16 NOTE — Assessment & Plan Note (Signed)
Plan: Continue Eliquis 

## 2020-03-16 NOTE — Assessment & Plan Note (Signed)
Moderate obstructive sleep apnea BMI 60.9 Likely component of obesity hypoventilation syndrome  Plan: Resume CPAP therapy May need to consider baseline lab work at next office visit to monitor for CO2 levels, could consider ABG Work with primary care on increasing overall physical activity and working to reduce your BMI

## 2020-03-16 NOTE — Progress Notes (Signed)
@Patient  ID: Nicole Schmidt, female    DOB: 06-Feb-1966, 55 y.o.   MRN: [id number]  Chief Complaint  Patient presents with  . Follow-up    Recent admission 02/21/2020. Discharged on 2L. C/o sob with exertion and occ dry cough.     Referring provider: Lenard Simmer, MD  HPI:  55 year old female never smoker [facility] for obstructive sleep apnea  PMH: Per CHF, morbid obesity, history of PE, A. fib Smoker/ Smoking History: Never smoker Maintenance: None Pt of: Dr. Halford [facility])  03/16/2020  - Visit   55 year old female never smoker [facility] for moderate obstructive sleep apnea.  Patient presenting today as a follow-up visit.  She was seen last in our office in December/2021 where she was encouraged to remain on CPAP therapy.  Since then she has been hospitalized 2 times at [facility].  Once from 02/09/2020 through 02/18/2020.  Unfortunately patient had to go back to the hospital on 02/21/2020 and discharged on 02/23/2020.  Discharge summary from last hospitalization as listed below:  ADMISSION DIAGNOSIS:  Acute on chronic respiratory failure with hypoxia (HCC) [J96.21] Acute on chronic respiratory failure with hypoxemia (HCC) [J96.21] Acute on chronic congestive heart failure, unspecified heart failure type ([place]) [I50.9]  DISCHARGE DIAGNOSIS:  Principal Problem:   Acute on chronic respiratory failure with hypoxia (HCC) Active Problems:   Pulmonary embolism (HCC)   Chronic renal disease, stage III (HCC)   Essential hypertension   Acute on chronic diastolic CHF (congestive heart failure) (HCC)   OSA on CPAP   Atrial fibrillation, chronic (HCC)   Diabetes mellitus type 2 in obese ([place])   Acquired hypothyroidism   Acute on chronic respiratory failure with hypoxemia (HCC)   AF (paroxysmal atrial fibrillation) ([place])   Acquired thrombophilia ([place])   SECONDARY DIAGNOSIS:       Past Medical History:  Diagnosis Date  . (HFpEF) heart failure with preserved  ejection fraction ([place])    a. 10/2018 Echo: EF 60-65%, diast dysfxn, RVSP 50.7 mmHg, mildly dil LA.  Marland Kitchen Acquired hypothyroidism 02/09/2020  . Anemia   . Arthritis   . Breast cancer ([place]) 2014   right breast cancer  . Breast cancer of upper-inner quadrant of right female breast ([place])    Right breast invasive CA and DCIS , 7 mm T1,N0,M0. Er/PR pos, her 2 negative.  Margins 1 mm.  . CHF (congestive heart failure) ([place])   . CKD (chronic kidney disease), stage III ([place])   . Diabetes mellitus type 2 in obese ([place]) 02/09/2020  . Diabetes mellitus without complication ([place])   . Dyslipidemia 02/09/2020  . Essential hypertension   . Gout   . Hypothyroidism   . Menopause    age 65  . Morbid obesity ([place])   . Persistent atrial fibrillation ([place])    a. Dx 10/2018 in setting of PNA. CHA2DS2VASc = 4-->Eliquis; b. 11/2018 s/p successful DCCV.  Marland Kitchen Personal history of radiation therapy   . Pulmonary embolism ([place]) 10/2014   a. Chronic eliquis.  . Thyroid goiter     HOSPITAL COURSE:   1.  Acute on chronic hypoxic respiratory failure.  The patient was recently diagnosed with chronic respiratory failure and sent home on 2 L of oxygen.  She stated at home she dropped her oxygen saturations into the low 80s with ambulation.  With ambulation she did come down to 89% on the day of discharge.  She can go up to 3 or 4 L with ambulation if continues to drop  down. 2.  Acute on chronic diastolic congestive heart failure.  The patient was given IV Lasix for 2 days and switched over to oral for twice a day dosing.  The patient wants to go home on once a day dosing.  If she is short of breath, gains 3 pounds in 1 day I advised her to take an extra half pill in the afternoon.  She has a follow-up at the CHF clinic tomorrow.  On metoprolol 3.  Essential hypertension on Norvasc, metoprolol, clonidine and Lasix 4.  Paroxysmal atrial fibrillation.  On Eliquis for anticoagulation.  Metoprolol for rate  control. 5.  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  Eliquis to reduce stroke risk. 6.  Chronic kidney disease stage IIIb.  Continue to watch creatinine closely with diuresis.  Continue to watch as outpatient and recommend checking BMP with follow-up appointments. 7.  Obstructive sleep apnea on CPAP at night 8.  Morbid obesity with a BMI of 62.06.  Recommend daily weights. 9.  Hypothyroidism unspecified on levothyroxine 10.  Type 2 diabetes mellitus.  Hemoglobin A1c actually lower than the threshold for this diagnosis at 6.4.  Diet controlled.  Patient presented to her office today reporting that she has not utilized her CPAP since being discharged from the hospital.  She was unsure if she was supposed to wear this.  She thought that her oxygen would suffice.  Patient has completed follow-up with cardiology earlier this month they requested follow-up in 3 months.  Patient reports adherence to her diuretics which is managed by her nephrologist.  Patient continues work with physical therapy.  Patient continues to be physically deconditioned.  Patient walked in office today and did continue to need 2 L of O2 with physical exertion.Patient going to physical therapy later on today.  Questionaires / Pulmonary Flowsheets:   ACT:  No flowsheet data found.  MMRC: No flowsheet data found.  Epworth:  Results of the Epworth flowsheet 01/14/2019  Sitting and reading 1  Watching TV 1  Sitting, inactive in a public place (e.g. a theatre or a meeting) 1  As a passenger in a car for an hour without a break 1  Lying down to rest in the afternoon when circumstances permit 1  Sitting and talking to someone 0  Sitting quietly after a lunch without alcohol 1  In a car, while stopped for a few minutes in traffic 0  Total score 6    Tests:   HST 04/26/19 >> AHI 17.1, SpO2 low 54%.  Spent 233.9 min with SpO2 < 89%.  FENO:  No results found for: NITRICOXIDE  PFT: No flowsheet data  found.  WALK:  SIX MIN WALK 03/16/2020 03/16/2020  Supplimental Oxygen during Test? (L/min) Yes No  O2 Flow Rate 2 -  Type Continuous -  Tech Comments: sob -    Imaging: DG Chest 2 View  Result Date: 02/20/2020 CLINICAL DATA:  Shortness of breath.  Decreased oxygen when walking. EXAM: CHEST - 2 VIEW COMPARISON:  Radiograph 2 days ago.  Chest CT 02/09/2020 FINDINGS: Lower lung volumes from prior exam. Stable cardiomegaly. Unchanged mediastinal contours. Aortic atherosclerosis improving patchy and interstitial opacities, pulmonary edema versus multifocal pneumonia. Nodular opacities on prior CT are not well-defined by radiograph. No significant pleural fluid. No pneumothorax. Stable osseous structures. IMPRESSION: 1. Lower lung volumes. Improving patchy and interstitial opacities, pulmonary edema versus multifocal pneumonia. 2. Stable cardiomegaly. Electronically Signed   By: Keith Rake M.D.   On: 02/20/2020 22:51   DG  Chest Port 1 View  Result Date: 02/18/2020 CLINICAL DATA:  Shortness of breath. EXAM: PORTABLE CHEST 1 VIEW COMPARISON:  02/15/2020. FINDINGS: Stable cardiomegaly. Low lung volumes. Diffuse bilateral interstitial prominence, unchanged. Interstitial edema and/or pneumonitis could present this fashion. No pleural effusion or pneumothorax. IMPRESSION: 1. Stable cardiomegaly. 2. Low lung volumes. Diffuse bilateral interstitial prominence, unchanged. Interstitial edema and/or pneumonitis could present in this fashion. Electronically Signed   By: Marcello Moores  Register   On: 02/18/2020 05:15   DG Chest Port 1 View  Result Date: 02/15/2020 CLINICAL DATA:  Respiratory failure EXAM: PORTABLE CHEST 1 VIEW COMPARISON:  02/09/2020 FINDINGS: Cardiomegaly. Interstitial prominence and patchy bilateral airspace opacities, slightly worsened since prior study. No effusions or pneumothorax. IMPRESSION: Worsening interstitial prominence and patchy bilateral airspace disease. Electronically Signed   By: Rolm Baptise M.D.   On: 02/15/2020 14:22    Lab Results:  CBC    Component Value Date/Time   WBC 10.1 02/27/2020 0931   WBC 11.4 (H) 02/22/2020 0547   RBC 3.63 (L) 02/27/2020 0931   RBC 3.56 (L) 02/22/2020 0547   HGB 9.9 (L) 02/27/2020 0931   HCT 32.2 (L) 02/27/2020 0931   PLT 232 02/27/2020 0931   MCV 89 02/27/2020 0931   MCV 90 03/09/2014 1533   MCH 27.3 02/27/2020 0931   MCH 27.8 02/22/2020 0547   MCHC 30.7 (L) 02/27/2020 0931   MCHC 29.9 (L) 02/22/2020 0547   RDW 13.0 02/27/2020 0931   RDW 15.2 (H) 03/09/2014 1533   LYMPHSABS 2.1 02/11/2020 0522   LYMPHSABS 1.5 03/09/2014 1533   MONOABS 1.3 (H) 02/11/2020 0522   MONOABS 0.6 03/09/2014 1533   EOSABS 0.4 02/11/2020 0522   EOSABS 0.2 03/09/2014 1533   BASOSABS 0.0 02/11/2020 0522   BASOSABS 0.1 03/09/2014 1533    BMET    Component Value Date/Time   NA 144 02/27/2020 0931   NA 141 03/09/2014 1533   K 5.1 02/27/2020 0931   K 4.2 03/09/2014 1533   CL 102 02/27/2020 0931   CL 101 03/09/2014 1533   CO2 32 (H) 02/27/2020 0931   CO2 30 03/09/2014 1533   GLUCOSE 149 (H) 02/27/2020 0931   GLUCOSE 129 (H) 02/23/2020 0504   GLUCOSE 95 03/09/2014 1533   BUN 40 (H) 02/27/2020 0931   BUN 35 (H) 03/09/2014 1533   CREATININE 1.83 (H) 02/27/2020 0931   CREATININE 1.50 (H) 03/09/2014 1533   CALCIUM 9.2 02/27/2020 0931   CALCIUM 8.8 03/09/2014 1533   GFRNONAA 30 (L) 02/27/2020 0931   GFRNONAA 28 (L) 02/23/2020 0504   GFRNONAA 39 (L) 03/09/2014 1533   GFRNONAA 55 (L) 07/31/2013 1521   GFRAA 35 (L) 02/27/2020 0931   GFRAA 47 (L) 03/09/2014 1533   GFRAA >60 07/31/2013 1521    BNP    Component Value Date/Time   BNP 210.7 (H) 02/20/2020 2212    ProBNP No results found for: PROBNP  Specialty Problems      Pulmonary Problems   Acute hypoxemic respiratory failure (HCC)   OSA on CPAP   Acute on chronic respiratory failure with hypoxia (HCC)   Acute on chronic respiratory failure with hypoxemia (HCC)      No Known  Allergies  Immunization History  Administered Date(s) Administered  . Influenza-Unspecified 11/05/2018  . PFIZER(Purple Top)SARS-COV-2 Vaccination 05/19/2019, 06/16/2019, 02/18/2020  . Pneumococcal Polysaccharide-23 02/16/2020    Past Medical History:  Diagnosis Date  . (HFpEF) heart failure with preserved ejection fraction ([place])    a. 10/2018 Echo:  EF 60-65%, diast dysfxn, RVSP 50.7 mmHg, mildly dil LA.  Marland Kitchen Acquired hypothyroidism 02/09/2020  . Anemia   . Arthritis   . Breast cancer ([place]) 2014   right breast cancer  . Breast cancer of upper-inner quadrant of right female breast ([place])    Right breast invasive CA and DCIS , 7 mm T1,N0,M0. Er/PR pos, her 2 negative.  Margins 1 mm.  . CHF (congestive heart failure) ([place])   . CKD (chronic kidney disease), stage III ([place])   . Diabetes mellitus type 2 in obese ([place]) 02/09/2020  . Diabetes mellitus without complication ([place])   . Dyslipidemia 02/09/2020  . Essential hypertension   . Gout   . Hypothyroidism   . Menopause    age 60  . Morbid obesity ([place])   . Persistent atrial fibrillation ([place])    a. Dx 10/2018 in setting of PNA. CHA2DS2VASc = 4-->Eliquis; b. 11/2018 s/p successful DCCV.  Marland Kitchen Personal history of radiation therapy   . Pulmonary embolism ([place]) 10/2014   a. Chronic eliquis.  . Thyroid goiter     Tobacco History: Social History   Tobacco Use  Smoking Status Never Smoker  Smokeless Tobacco Never Used   Counseling given: Not Answered   Continue to not smoke  Outpatient Encounter Medications as of 03/16/2020  Medication Sig  . acetaminophen (TYLENOL) 500 MG tablet Take 1,000 mg by mouth every 6 (six) hours as needed (for pain).  Marland Kitchen amLODipine (NORVASC) 5 MG tablet Take 5 mg by mouth 2 (two) times daily.  Marland Kitchen atorvastatin (LIPITOR) 20 MG tablet Take 20 mg by mouth at bedtime.   . calcium carbonate (OSCAL) 1500 (600 Ca) MG TABS tablet Take 600 mg of elemental calcium by mouth daily.   . Cholecalciferol (VITAMIN D3) 50000  units TABS Take 50,000 Units by mouth every Friday.   Marland Kitchen CINNAMON PO Take 1,000 mg by mouth daily.   . cloNIDine (CATAPRES) 0.2 MG tablet Take 1 tablet (0.2 mg total) by mouth 2 (two) times daily.  Marland Kitchen CRANBERRY CONCENTRATE PO Take 1 capsule by mouth daily.  . Dulaglutide 1.5 MG/0.5ML SOPN Inject 1.5 mg into the skin every 7 (seven) days.  Marland Kitchen ELIQUIS 5 MG TABS tablet TAKE 1 TABLET TWICE A DAY  . folic acid (FOLVITE) 497 MCG tablet Take 800 mcg by mouth daily.   . furosemide (LASIX) 40 MG tablet Take 1 tablet (40 mg total) by mouth daily.  Marland Kitchen KLOR-CON M20 20 MEQ tablet TAKE 1 TABLET DAILY  . levothyroxine (SYNTHROID, LEVOTHROID) 112 MCG tablet Take 112 mcg daily before breakfast by mouth.  . Melatonin 10 MG TABS Take 10 mg by mouth at bedtime.  . metoprolol tartrate (LOPRESSOR) 50 MG tablet TAKE 1 TABLET TWICE A DAY  . vitamin C (ASCORBIC ACID) 500 MG tablet Take 500 mg by mouth 2 (two) times daily.  . [DISCONTINUED] fluticasone-salmeterol (ADVAIR HFA) 230-21 MCG/ACT inhaler Inhale 2 puffs into the lungs 2 (two) times daily as needed.  (Patient not taking: Reported on 02/03/2020)   No facility-administered encounter medications on file as of 03/16/2020.     Review of Systems  Review of Systems  Constitutional: Negative for activity change, fatigue and fever.  HENT: Negative for sinus pressure, sinus pain and sore throat.   Respiratory: Negative for cough, shortness of breath and wheezing.   Cardiovascular: Negative for chest pain and palpitations.  Gastrointestinal: Negative for diarrhea, nausea and vomiting.  Musculoskeletal: Negative for arthralgias.  Neurological: Negative for dizziness.  Psychiatric/Behavioral: Negative for  sleep disturbance. The patient is not nervous/anxious.      Physical Exam  BP 126/78 (BP Location: Left Arm, Cuff Size: Normal)   Pulse 62   Temp 98.2 F (36.8 C) (Temporal)   Ht 5\' 4"  (1.626 m)   Wt (!) 355 lb (161 kg)   LMP 07/17/2012   SpO2 99%   BMI 60.94  kg/m   Wt Readings from Last 5 Encounters:  03/16/20 (!) 355 lb (161 kg)  02/27/20 (!) 364 lb (165.1 kg)  02/24/20 (!) 364 lb 6 oz (165.3 kg)  02/22/20 (!) 361 lb 8.9 oz (164 kg)  02/14/20 (!) 369 lb 4.3 oz (167.5 kg)    BMI Readings from Last 5 Encounters:  03/16/20 60.94 kg/m  02/27/20 62.48 kg/m  02/24/20 62.54 kg/m  02/22/20 62.06 kg/m  02/14/20 63.39 kg/m     Physical Exam Vitals and nursing note reviewed.  Constitutional:      General: She is not in acute distress.    Appearance: Normal appearance. She is obese.  HENT:     Head: Normocephalic and atraumatic.     Right Ear: External ear normal.     Left Ear: External ear normal.     Nose: Nose normal. No congestion.     Mouth/Throat:     Mouth: Mucous membranes are moist.     Pharynx: Oropharynx is clear.  Eyes:     Pupils: Pupils are equal, round, and reactive to light.  Cardiovascular:     Rate and Rhythm: Normal rate and regular rhythm.     Pulses: Normal pulses.     Heart sounds: Normal heart sounds. No murmur heard.   Pulmonary:     Effort: Pulmonary effort is normal. No respiratory distress.     Breath sounds: Normal breath sounds. No decreased air movement. No decreased breath sounds, wheezing or rales.  Musculoskeletal:     Cervical back: Normal range of motion.  Skin:    General: Skin is warm and dry.     Capillary Refill: Capillary refill takes less than 2 seconds.  Neurological:     General: No focal deficit present.     Mental Status: She is alert and oriented to person, place, and time. Mental status is at baseline.     Gait: Gait normal.  Psychiatric:        Mood and Affect: Mood normal.        Behavior: Behavior normal.        Thought Content: Thought content normal.        Judgment: Judgment normal.       Assessment & Plan:   Acute on chronic diastolic CHF (congestive heart failure) (HCC) Plan: Continue diuretics Continue follow-up with cardiology Continue follow-up with  nephrology  Acute on chronic respiratory failure with hypoxia (HCC) Stable today Walk today in office patient did require 2 L of O2 with physical exertion Physically deconditioned  Plan: Continue 2 L of O2 with physical exertion Continue 2 L of O2 at night through CPAP  OSA on CPAP Moderate obstructive sleep apnea BMI 60.9 Likely component of obesity hypoventilation syndrome  Plan: Resume CPAP therapy May need to consider baseline lab work at next office visit to monitor for CO2 levels, could consider ABG Work with primary care on increasing overall physical activity and working to reduce your BMI  Abnormal findings on diagnostic imaging of lung Plan: Repeat chest x-ray today  History of pulmonary embolus (PE) Plan: Continue Eliquis  Morbid obesity with BMI  of 60.0-69.9, adult ([place]) BMI 60.9  Plan: Continue to work with primary care and working to reduce your BMI  Physical deconditioning Plan: Continue to work with physical therapy Work to reduce your BMI    Return in about 3 months (around 06/13/2020), or if symptoms worsen or fail to improve, for [employer].   [name], NP 03/16/2020   This appointment required 34 minutes of patient care (this includes precharting, chart review, review of results, face-to-face care, etc.).

## 2020-03-16 NOTE — Therapy (Signed)
[place] MAIN [facility] SERVICES [address] [place], [place], [address] Phone: [phone]   Fax:  [phone]  Physical Therapy Treatment  Patient Details  Name: Nicole Schmidt MRN: [id number] Date of Birth: 29-Mar-1965 Referring Provider (PT): [place], [place]   Encounter Date: 03/16/2020   PT End of Session - 03/16/20 1333    Visit Number 5    Number of Visits 16    Date for PT Re-Evaluation 04/21/20    Authorization Type 1/10 eval 02/25/20    PT Start Time 1330    PT Stop Time 1415    PT Time Calculation (min) 45 min    Equipment Utilized During Treatment Gait belt;Oxygen   2 L via nasal cannula   Activity Tolerance Patient limited by fatigue;Other (comment)   Sp02 limitations   Behavior During Therapy [facility] for tasks assessed/performed           Past Medical History:  Diagnosis Date  . (HFpEF) heart failure with preserved ejection fraction ([place])    a. 10/2018 Echo: EF 60-65%, diast dysfxn, RVSP 50.7 mmHg, mildly dil LA.  Marland Kitchen Acquired hypothyroidism 02/09/2020  . Anemia   . Arthritis   . Breast cancer ([place]) 2014   right breast cancer  . Breast cancer of upper-inner quadrant of right female breast ([place])    Right breast invasive CA and DCIS , 7 mm T1,N0,M0. Er/PR pos, her 2 negative.  Margins 1 mm.  . CHF (congestive heart failure) ([place])   . CKD (chronic kidney disease), stage III ([place])   . Diabetes mellitus type 2 in obese ([place]) 02/09/2020  . Diabetes mellitus without complication ([place])   . Dyslipidemia 02/09/2020  . Essential hypertension   . Gout   . Hypothyroidism   . Menopause    age 55  . Morbid obesity ([place])   . Persistent atrial fibrillation ([place])    a. Dx 10/2018 in setting of PNA. CHA2DS2VASc = 4-->Eliquis; b. 11/2018 s/p successful DCCV.  Marland Kitchen Personal history of radiation therapy   . Pulmonary embolism ([place]) 10/2014   a. Chronic eliquis.  . Thyroid goiter     Past Surgical History:  Procedure Laterality Date  .  BREAST BIOPSY Right 2014   breast ca  . BREAST EXCISIONAL BIOPSY Right 07/19/2012   breast ca  . BREAST SURGERY Right 2014   with sentinel node bx subareaolar duct excision  . CARDIOVERSION N/A 11/28/2018   Procedure: CARDIOVERSION;  Surgeon: Minna Merritts, MD;  Location: [facility] ORS;  Service: Cardiovascular;  Laterality: N/A;  . COLONOSCOPY WITH PROPOFOL N/A 01/30/2017   Procedure: COLONOSCOPY WITH PROPOFOL;  Surgeon: Christene Lye, MD;  Location: [facility] ENDOSCOPY;  Service: Endoscopy;  Laterality: N/A;    There were no vitals filed for this visit.   Subjective Assessment - 03/16/20 1337    Subjective Patient denies of any new symptoms or pain since last therapy session. She denies of any falls or injuries.    Pertinent History Patient presents to physical therapy for weakness. Has been to ED twice since Christmas.Was discharged from hospital on 02/23/20.  Patient presents with 2 L of 02 via nasal cannula which is new to her. Is monitoring her Sp02 at home and noticing it is low. PMH includes HFpEF, hypothyroidism, anemia, arthritis, breast cancer, CHF, CKD, DM, Dyslipidemia, HTN, gout, hypothyroidism, morbid obesity, a fib, PE, thyroid goiter.    Limitations Lifting;Standing;Walking;House hold activities;Other (comment)    How long can you sit comfortably? no problem  How long can you stand comfortably? several minutes    How long can you walk comfortably? not sure. gets winded quickly.    Currently in Pain? No/denies           All exercises were completed with 3# ankle weight:  There Ex:  Seated LAQ x 30 BLE; Seated marches  x 30 BLE; Standing hip 3-way (flex, abd, ext) x 30 BLE Standing hamstring curl x 30 BLE  Standing mini squats with BUE support x 30 reps Standing heel raises with BUE support x 30 reps     Standing cross over orange hurdle x sideways x 10 reps; SaO2: 91%, HR 68   Gait training: ambulated on level surface x 215 ft, x 85 ft while being on 2L O2  via [place]   Clinical Impression: Patient tolerated strengthening exercises with good tolerance to activity. Patient demonstrated improvement in functional activity with increased duration while completing standing exercises. She has improved her ambulation distance to 215 ft with one standing therapeutic rest break and continued ambulation for another 85 feet. Therapist provided patient education on activity pacing to improve cardiopulmonary status with activity by slowing down when ambulating to improve onset of SOB. Patient will continue to benefit from skilled physical therapy to improve generalized strength, ROM, and capacity for functional activity.           PT Short Term Goals - 02/25/20 1719      PT SHORT TERM GOAL #1   Title Patient will be independent in home exercise program to improve strength/mobility for better functional independence with ADLs.    Baseline 1/12: HEP given    Time 2    Period Weeks    Status New    Target Date 03/10/20             PT Long Term Goals - 02/25/20 1720      PT LONG TERM GOAL #1   Title Patient will increase FOTO score to equal to or greater than   54%  to demonstrate statistically significant improvement in mobility and quality of life.    Baseline 1/12: 45%    Time 8    Period Weeks    Status New    Target Date 04/21/20      PT LONG TERM GOAL #2   Title Patient will increase 10 meter walk test to >1.10m/s as to improve gait speed for better community ambulation and to reduce fall risk while maintaining SP02>90%    Baseline 1/12: 0.7 m/s    Time 8    Period Weeks    Status New    Target Date 04/21/20      PT LONG TERM GOAL #3   Title Patient will increase BLE gross strength to 4+/5 as to improve functional strength for independent gait, increased standing tolerance and increased ADL ability.    Baseline 1/12: see note    Time 8    Period Weeks    Status New    Target Date 04/21/20      PT LONG TERM GOAL #4   Title Patient  will ambulate >200 ft without an AD with Sp02>90% for increased functional mobility.    Baseline 1/12: 68 ft wtih Sp02 drop    Time 8    Period Weeks    Status New    Target Date 04/21/20                 Plan - 03/16/20 1458    Clinical  Impression Statement Patient tolerated strengthening exercises with good tolerance to activity. Patient demonstrated improvement in functional activity with increased duration while completing standing exercises. She has improved her ambulation distance to 215 ft with one standing therapeutic rest break and continued ambulation for another 85 feet. Therapist provided patient education on activity pacing to improve cardiopulmonary status with activity by slowing down when ambulating to improve onset of SOB. Patient will continue to benefit from skilled physical therapy to improve generalized strength, ROM, and capacity for functional activity.    Personal Factors and Comorbidities Age;Comorbidity 3+;Finances;Fitness;Past/Current Experience;Transportation    Comorbidities HFpEF, hypothyroidism, anemia, arthritis, breast cancer, CHF, CKD, DM, Dyslipidemia, HTN, gout, hypothyroidism, morbid obesity, a fib, PE, thyroid goiter.    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    Rehab Potential Fair    PT Frequency 2x / week    PT Duration 8 weeks    PT Treatment/Interventions Patient/family education;Therapeutic activities;Therapeutic exercise;Functional mobility training;Balance training;Neuromuscular re-education;ADLs/Self Care Home Management;Aquatic Therapy;Canalith Repostioning;Cryotherapy;Electrical Stimulation;Iontophoresis 4mg /ml  Dexamethasone;Moist Heat;Traction;Ultrasound;DME Instruction;Gait training;Stair training;Orthotic Fit/Training;Manual techniques;Dry needling;Taping;Vestibular;Visual/perceptual remediation/compensation    PT Next Visit Plan strengthening , monitor Sp02    PT Home Exercise Plan see above    Consulted and Agree with Plan of Care Patient           Patient will benefit from skilled therapeutic intervention in order to improve the following deficits and impairments:  Abnormal gait,Decreased balance,Decreased endurance,Decreased mobility,Difficulty walking,Obesity,Decreased strength,Cardiopulmonary status limiting activity,Decreased activity tolerance,Decreased safety awareness,Impaired flexibility,Impaired perceived functional ability,Impaired sensation,Postural dysfunction,Improper body mechanics  Visit Diagnosis: Muscle weakness (generalized)  Other abnormalities of gait and mobility     Problem List Patient Active Problem List   Diagnosis Date Noted  . Physical deconditioning 03/16/2020  . Abnormal findings on diagnostic imaging of lung 03/16/2020  . AF (paroxysmal atrial fibrillation) ([place])   . Acquired thrombophilia ([place])   . Acute on chronic respiratory failure with hypoxia ([place]) 02/21/2020  . Acute on chronic respiratory failure with hypoxemia ([place]) 02/21/2020  . Atrial fibrillation, chronic ([place]) 02/09/2020  . Dyslipidemia 02/09/2020  . Diabetes mellitus type 2 in obese ([place]) 02/09/2020  . Acquired hypothyroidism 02/09/2020  . Thrush, oral 02/09/2020  . Morbid obesity with BMI of 60.0-69.9, adult ([place]) 02/03/2020  . Chronic anticoagulation 12/30/2019  . OSA on CPAP 08/04/2019  . Atrial fibrillation with RVR ([place]) 10/18/2018  . Acute hypoxemic respiratory failure ([place]) 03/29/2018  . Acute on chronic diastolic CHF (congestive heart failure) ([place]) 02/04/2018  . Lymphedema 02/04/2018  . Essential hypertension 12/09/2017  . Long term (current) use of aromatase inhibitors  11/17/2017  . Elevated alkaline phosphatase level 05/20/2017  . Goals of care, counseling/discussion 05/14/2017  . Chronic renal disease, stage III ([place]) 07/08/2015  . Iron deficiency anemia 07/08/2015  . Acute renal failure superimposed on stage 3 chronic kidney disease ([place]) 10/22/2014  . History of breast cancer 10/22/2014  . Elevated troponin 10/22/2014  . Pulmonary emboli ([place]) 10/18/2014  . History of pulmonary embolus ([place]) 10/18/2014  . Candidiasis of breast 07/06/2014  . Malignant neoplasm of right female breast [facility]) 07/24/2012   Karl Luke PT, DPT Netta Corrigan 03/16/2020, 2:59 PM  [place] MAIN [facility] SERVICES [address] [place], [place], [address] Phone: [phone]   Fax:  [phone]  Name: Nicole Schmidt MRN: [id number] Date of Birth: [birth year]

## 2020-03-16 NOTE — Assessment & Plan Note (Signed)
Plan: Repeat chest x-ray today

## 2020-03-18 ENCOUNTER — Ambulatory Visit: Payer: BC Managed Care – PPO | Admitting: Physical Therapy

## 2020-03-18 ENCOUNTER — Other Ambulatory Visit: Payer: Self-pay

## 2020-03-18 DIAGNOSIS — M6281 Muscle weakness (generalized): Secondary | ICD-10-CM | POA: Diagnosis not present

## 2020-03-18 DIAGNOSIS — R2689 Other abnormalities of gait and mobility: Secondary | ICD-10-CM | POA: Diagnosis not present

## 2020-03-18 NOTE — Therapy (Signed)
[place] MAIN [facility] SERVICES [address] [place], [place], [address] Phone: [phone]   Fax:  [phone]  Physical Therapy Treatment  Patient Details  Name: Nicole Schmidt MRN: [id number] Date of Birth: [birth year] Referring Provider (PT): [place], [place]   Encounter Date: 03/18/2020   PT End of Session - 03/18/20 1528    Visit Number 6    Number of Visits 16    Date for PT Re-Evaluation 04/21/20    Authorization Type 1/10 eval 02/25/20    PT Start Time 1303    PT Stop Time 1345    PT Time Calculation (min) 42 min    Equipment Utilized During Treatment Gait belt;Oxygen   2 L via nasal cannula   Activity Tolerance Patient limited by fatigue;Other (comment)   Sp02 limitations   Behavior During Therapy [facility] for tasks assessed/performed           Past Medical History:  Diagnosis Date  . (HFpEF) heart failure with preserved ejection fraction ([place])    a. 10/2018 Echo: EF 60-65%, diast dysfxn, RVSP 50.7 mmHg, mildly dil LA.  Marland Kitchen Acquired hypothyroidism 02/09/2020  . Anemia   . Arthritis   . Breast cancer ([place]) 2014   right breast cancer  . Breast cancer of upper-inner quadrant of right female breast ([place])    Right breast invasive CA and DCIS , 7 mm T1,N0,M0. Er/PR pos, her 2 negative.  Margins 1 mm.  . CHF (congestive heart failure) ([place])   . CKD (chronic kidney disease), stage III ([place])   . Diabetes mellitus type 2 in obese ([place]) 02/09/2020  . Diabetes mellitus without complication ([place])   . Dyslipidemia 02/09/2020  . Essential hypertension   . Gout   . Hypothyroidism   . Menopause    age 37  . Morbid obesity ([place])   . Persistent atrial fibrillation ([place])    a. Dx 10/2018 in setting of PNA. CHA2DS2VASc = 4-->Eliquis; b. 11/2018 s/p successful DCCV.  Marland Kitchen Personal history of radiation therapy   . Pulmonary embolism ([place]) 10/2014   a. Chronic eliquis.  . Thyroid goiter     Past Surgical History:  Procedure Laterality Date  .  BREAST BIOPSY Right 2014   breast ca  . BREAST EXCISIONAL BIOPSY Right 07/19/2012   breast ca  . BREAST SURGERY Right 2014   with sentinel node bx subareaolar duct excision  . CARDIOVERSION N/A 11/28/2018   Procedure: CARDIOVERSION;  Surgeon: Minna Merritts, MD;  Location: [facility] ORS;  Service: Cardiovascular;  Laterality: N/A;  . COLONOSCOPY WITH PROPOFOL N/A 01/30/2017   Procedure: COLONOSCOPY WITH PROPOFOL;  Surgeon: Christene Lye, MD;  Location: [facility] ENDOSCOPY;  Service: Endoscopy;  Laterality: N/A;    There were no vitals filed for this visit.   Subjective Assessment - 03/18/20 1527    Subjective No new symptoms or pain reported since last therapy session.    Pertinent History Patient presents to physical therapy for weakness. Has been to ED twice since Christmas.Was discharged from hospital on 02/23/20.  Patient presents with 2 L of 02 via nasal cannula which is new to her. Is monitoring her Sp02 at home and noticing it is low. PMH includes HFpEF, hypothyroidism, anemia, arthritis, breast cancer, CHF, CKD, DM, Dyslipidemia, HTN, gout, hypothyroidism, morbid obesity, a fib, PE, thyroid goiter.    Limitations Lifting;Standing;Walking;House hold activities;Other (comment)    How long can you sit comfortably? no problem    How long can you stand comfortably? several  minutes    How long can you walk comfortably? not sure. gets winded quickly.    Currently in Pain? No/denies             All exercises were completed with 2.5# ankle weight:  There Ex:  Seated LAQ x 20 BLE; Seated marches  x 20 BLE;   Standing cross over orange hurdle x sideways/forwards x 10 reps; SaO2: 94%, HR 71  Gait training: ambulated on level surface x 200 ft, x 70 ft while being on 2L O2 via [place]; brief standing rest break   Clinical Impression: Patient completed strength training with fair tolerance to activity. She continues to have difficulty with ambulating for longer distances due to muscle  fatigue. She denies of any pain with any exercises today. Patient will continue to benefit from skilled physical therapy to improve generalized strength, ROM, and capacity for functional activity.       PT Short Term Goals - 02/25/20 1719      PT SHORT TERM GOAL #1   Title Patient will be independent in home exercise program to improve strength/mobility for better functional independence with ADLs.    Baseline 1/12: HEP given    Time 2    Period Weeks    Status New    Target Date 03/10/20             PT Long Term Goals - 02/25/20 1720      PT LONG TERM GOAL #1   Title Patient will increase FOTO score to equal to or greater than   54%  to demonstrate statistically significant improvement in mobility and quality of life.    Baseline 1/12: 45%    Time 8    Period Weeks    Status New    Target Date 04/21/20      PT LONG TERM GOAL #2   Title Patient will increase 10 meter walk test to >1.46m/s as to improve gait speed for better community ambulation and to reduce fall risk while maintaining SP02>90%    Baseline 1/12: 0.7 m/s    Time 8    Period Weeks    Status New    Target Date 04/21/20      PT LONG TERM GOAL #3   Title Patient will increase BLE gross strength to 4+/5 as to improve functional strength for independent gait, increased standing tolerance and increased ADL ability.    Baseline 1/12: see note    Time 8    Period Weeks    Status New    Target Date 04/21/20      PT LONG TERM GOAL #4   Title Patient will ambulate >200 ft without an AD with Sp02>90% for increased functional mobility.    Baseline 1/12: 68 ft wtih Sp02 drop    Time 8    Period Weeks    Status New    Target Date 04/21/20                 Plan - 03/18/20 1731    Clinical Impression Statement Patient completed strength training with fair tolerance to activity. She continues to have difficulty with ambulating for longer distances due to muscle fatigue. She denies of any pain with any  exercises today. Patient will continue to benefit from skilled physical therapy to improve generalized strength, ROM, and capacity for functional activity.    Personal Factors and Comorbidities Age;Comorbidity 3+;Finances;Fitness;Past/Current Experience;Transportation    Comorbidities HFpEF, hypothyroidism, anemia, arthritis, breast cancer, CHF, CKD, DM, Dyslipidemia,  HTN, gout, hypothyroidism, morbid obesity, a fib, PE, thyroid goiter.    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    Rehab Potential Fair    PT Frequency 2x / week    PT Duration 8 weeks    PT Treatment/Interventions Patient/family education;Therapeutic activities;Therapeutic exercise;Functional mobility training;Balance training;Neuromuscular re-education;ADLs/Self Care Home Management;Aquatic Therapy;Canalith Repostioning;Cryotherapy;Electrical Stimulation;Iontophoresis 4mg /ml Dexamethasone;Moist Heat;Traction;Ultrasound;DME Instruction;Gait training;Stair training;Orthotic Fit/Training;Manual techniques;Dry needling;Taping;Vestibular;Visual/perceptual remediation/compensation    PT Next Visit Plan strengthening , monitor Sp02    PT Home Exercise Plan see above    Consulted and Agree with Plan of Care Patient           Patient will benefit from skilled therapeutic intervention in order to improve the following deficits and impairments:  Abnormal gait,Decreased balance,Decreased endurance,Decreased mobility,Difficulty walking,Obesity,Decreased strength,Cardiopulmonary status limiting activity,Decreased activity tolerance,Decreased safety awareness,Impaired flexibility,Impaired perceived functional  ability,Impaired sensation,Postural dysfunction,Improper body mechanics  Visit Diagnosis: Muscle weakness (generalized)  Other abnormalities of gait and mobility     Problem List Patient Active Problem List   Diagnosis Date Noted  . Physical deconditioning 03/16/2020  . Abnormal findings on diagnostic imaging of lung 03/16/2020  . AF (paroxysmal atrial fibrillation) ([place])   . Acquired thrombophilia ([place])   . Acute on chronic respiratory failure with hypoxia ([place]) 02/21/2020  . Acute on chronic respiratory failure with hypoxemia ([place]) 02/21/2020  . Atrial fibrillation, chronic ([place]) 02/09/2020  . Dyslipidemia 02/09/2020  . Diabetes mellitus type 2 in obese ([place]) 02/09/2020  . Acquired hypothyroidism 02/09/2020  . Thrush, oral 02/09/2020  . Morbid obesity with BMI of 60.0-69.9, adult ([place]) 02/03/2020  . Chronic anticoagulation 12/30/2019  . OSA on CPAP 08/04/2019  . Atrial fibrillation with RVR ([place]) 10/18/2018  . Acute hypoxemic respiratory failure ([place]) 03/29/2018  . Acute on chronic diastolic CHF (congestive heart failure) ([place]) 02/04/2018  . Lymphedema 02/04/2018  . Essential hypertension 12/09/2017  . Long term (current) use of aromatase inhibitors 11/17/2017  . Elevated alkaline phosphatase level 05/20/2017  . Goals of care, counseling/discussion 05/14/2017  . Chronic renal disease, stage III ([place]) 07/08/2015  . Iron deficiency anemia 07/08/2015  . Acute renal failure superimposed on stage 3 chronic kidney disease ([place]) 10/22/2014  . History of breast cancer 10/22/2014  . Elevated troponin 10/22/2014  . Pulmonary emboli ([place]) 10/18/2014  . History of pulmonary embolus ([place]) 10/18/2014  . Candidiasis of breast 07/06/2014  . Malignant neoplasm of right female breast [facility]) 07/24/2012   Karl Luke PT, DPT Netta Corrigan 03/18/2020, 5:32 PM  [place] MAIN [facility] SERVICES [address] [place], [place], [address] Phone:  [phone]   Fax:  [phone]  Name: Nicole Schmidt MRN: [id number] Date of Birth: Jul 28, 1965

## 2020-03-20 DIAGNOSIS — J9601 Acute respiratory failure with hypoxia: Secondary | ICD-10-CM | POA: Diagnosis not present

## 2020-03-20 DIAGNOSIS — I5032 Chronic diastolic (congestive) heart failure: Secondary | ICD-10-CM | POA: Diagnosis not present

## 2020-03-20 DIAGNOSIS — G4733 Obstructive sleep apnea (adult) (pediatric): Secondary | ICD-10-CM | POA: Diagnosis not present

## 2020-03-20 DIAGNOSIS — I482 Chronic atrial fibrillation, unspecified: Secondary | ICD-10-CM | POA: Diagnosis not present

## 2020-03-23 ENCOUNTER — Ambulatory Visit: Payer: BC Managed Care – PPO | Admitting: Physical Therapy

## 2020-03-23 ENCOUNTER — Other Ambulatory Visit: Payer: Self-pay

## 2020-03-23 DIAGNOSIS — M6281 Muscle weakness (generalized): Secondary | ICD-10-CM

## 2020-03-23 DIAGNOSIS — R2689 Other abnormalities of gait and mobility: Secondary | ICD-10-CM | POA: Diagnosis not present

## 2020-03-23 NOTE — Therapy (Signed)
[place] MAIN [facility] SERVICES [address] [place], [place], [address] Phone: [phone]   Fax:  [phone]  Physical Therapy Treatment  Patient Details  Name: Nicole Schmidt MRN: [id number] Date of Birth: Nov 28, 1965 Referring Provider (PT): [place], [place]   Encounter Date: 03/23/2020   PT End of Session - 03/23/20 1349    Visit Number 7    Number of Visits 16    Date for PT Re-Evaluation 04/21/20    Authorization Type 1/10 eval 02/25/20    PT Start Time 1345    PT Stop Time 1425    PT Time Calculation (min) 40 min    Equipment Utilized During Treatment Gait belt;Oxygen   2 L via nasal cannula   Activity Tolerance Patient limited by fatigue;Other (comment)   Sp02 limitations   Behavior During Therapy [facility] for tasks assessed/performed           Past Medical History:  Diagnosis Date  . (HFpEF) heart failure with preserved ejection fraction ([place])    a. 10/2018 Echo: EF 60-65%, diast dysfxn, RVSP 50.7 mmHg, mildly dil LA.  Marland Kitchen Acquired hypothyroidism 02/09/2020  . Anemia   . Arthritis   . Breast cancer ([place]) 2014   right breast cancer  . Breast cancer of upper-inner quadrant of right female breast ([place])    Right breast invasive CA and DCIS , 7 mm T1,N0,M0. Er/PR pos, her 2 negative.  Margins 1 mm.  . CHF (congestive heart failure) ([place])   . CKD (chronic kidney disease), stage III ([place])   . Diabetes mellitus type 2 in obese ([place]) 02/09/2020  . Diabetes mellitus without complication ([place])   . Dyslipidemia 02/09/2020  . Essential hypertension   . Gout   . Hypothyroidism   . Menopause    age 55  . Morbid obesity ([place])   . Persistent atrial fibrillation ([name])    a. Dx 10/2018 in setting of PNA. CHA2DS2VASc = 4-->Eliquis; b. 11/2018 s/p successful DCCV.  Marland Kitchen Personal history of radiation therapy   . Pulmonary embolism ([place]) 10/2014   a. Chronic eliquis.  . Thyroid goiter     Past Surgical History:  Procedure Laterality Date  .  BREAST BIOPSY Right 2014   breast ca  . BREAST EXCISIONAL BIOPSY Right 07/19/2012   breast ca  . BREAST SURGERY Right 2014   with sentinel node bx subareaolar duct excision  . CARDIOVERSION N/A 11/28/2018   Procedure: CARDIOVERSION;  Surgeon: Minna Merritts, MD;  Location: [facility] ORS;  Service: Cardiovascular;  Laterality: N/A;  . COLONOSCOPY WITH PROPOFOL N/A 01/30/2017   Procedure: COLONOSCOPY WITH PROPOFOL;  Surgeon: Christene Lye, MD;  Location: [facility] ENDOSCOPY;  Service: Endoscopy;  Laterality: N/A;    There were no vitals filed for this visit.   Subjective Assessment - 03/23/20 1750    Subjective Patient denies of any falls or injuries since last therapy. No new update since last time patient was seen in therapy.    Pertinent History Patient presents to physical therapy for weakness. Has been to ED twice since Christmas.Was discharged from hospital on 02/23/20.  Patient presents with 2 L of 02 via nasal cannula which is new to her. Is monitoring her Sp02 at home and noticing it is low. PMH includes HFpEF, hypothyroidism, anemia, arthritis, breast cancer, CHF, CKD, DM, Dyslipidemia, HTN, gout, hypothyroidism, morbid obesity, a fib, PE, thyroid goiter.    Limitations Lifting;Standing;Walking;House hold activities;Other (comment)    How long can you sit comfortably? no  problem    How long can you stand comfortably? several minutes    How long can you walk comfortably? not sure. gets winded quickly.    Currently in Pain? No/denies          Before starting therapy exercises in seated position:  SaO2: 99% HR: 52  Waited for 4-5 mins to see if HR elevated:   SaO2: 99% HR: 57  All exercises were completed with 2.5# AW:  Seated LAQ x 2 10 reps each LE Seated marches x 2 10 reps each LE Standing hip abduction x 2 10 reps each LE  SaO2: 96%, HR: 56  Standing hamstring curls x 2 10 reps each LE   SaO2: 98%, HR: 55  Stepping overs with orange hurdles x sideways x 10 reps  with BUE support   SaO2: 99%, HR: 58  Ambulation: one circle around gym, ~150 ft    SaO2: 94% HR: down to 41 bpm but was able to go up to 60-65 bpm upon resting for 2 mins          PT Short Term Goals - 02/25/20 1719      PT SHORT TERM GOAL #1   Title Patient will be independent in home exercise program to improve strength/mobility for better functional independence with ADLs.    Baseline 1/12: HEP given    Time 2    Period Weeks    Status New    Target Date 03/10/20             PT Long Term Goals - 02/25/20 1720      PT LONG TERM GOAL #1   Title Patient will increase FOTO score to equal to or greater than   54%  to demonstrate statistically significant improvement in mobility and quality of life.    Baseline 1/12: 45%    Time 8    Period Weeks    Status New    Target Date 04/21/20      PT LONG TERM GOAL #2   Title Patient will increase 10 meter walk test to >1.58m/s as to improve gait speed for better community ambulation and to reduce fall risk while maintaining SP02>90%    Baseline 1/12: 0.7 m/s    Time 8    Period Weeks    Status New    Target Date 04/21/20      PT LONG TERM GOAL #3   Title Patient will increase BLE gross strength to 4+/5 as to improve functional strength for independent gait, increased standing tolerance and increased ADL ability.    Baseline 1/12: see note    Time 8    Period Weeks    Status New    Target Date 04/21/20      PT LONG TERM GOAL #4   Title Patient will ambulate >200 ft without an AD with Sp02>90% for increased functional mobility.    Baseline 1/12: 68 ft wtih Sp02 drop    Time 8    Period Weeks    Status New    Target Date 04/21/20                 Plan - 03/23/20 1751    Clinical Impression Statement Patient tolerated strenghtening exercises with good tolerance today. Patient's SaO2 and HR were monitored between each exercise to determine to continue exercise or rest with HR reading continously being low in  the 50s. Patient's HR was as low to 41 bpm after ambulating ~150 feet. Her HR  immediately raised with rest but continued to be in the lower 60s and high 50s. Patient denies of any symptoms with the readings. Therapist encouraged patient to make appointment with PCP for follow up about cardiology pathology. She would benfit from continued PT services to increase strength and mobility to improve patient's quality of life.    Personal Factors and Comorbidities Age;Comorbidity 3+;Finances;Fitness;Past/Current Experience;Transportation    Comorbidities HFpEF, hypothyroidism, anemia, arthritis, breast cancer, CHF, CKD, DM, Dyslipidemia, HTN, gout, hypothyroidism, morbid obesity, a fib, PE, thyroid goiter.    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    Rehab Potential Fair    PT Frequency 2x / week    PT Duration 8 weeks    PT Treatment/Interventions Patient/family education;Therapeutic activities;Therapeutic exercise;Functional mobility training;Balance training;Neuromuscular re-education;ADLs/Self Care Home Management;Aquatic Therapy;Canalith Repostioning;Cryotherapy;Electrical Stimulation;Iontophoresis 4mg /ml Dexamethasone;Moist Heat;Traction;Ultrasound;DME Instruction;Gait training;Stair training;Orthotic Fit/Training;Manual techniques;Dry needling;Taping;Vestibular;Visual/perceptual remediation/compensation    PT Next Visit Plan strengthening , monitor Sp02    PT Home Exercise Plan see above    Consulted and Agree with Plan of Care Patient           Patient will benefit from skilled therapeutic intervention in order to improve the following deficits and impairments:   Abnormal gait,Decreased balance,Decreased endurance,Decreased mobility,Difficulty walking,Obesity,Decreased strength,Cardiopulmonary status limiting activity,Decreased activity tolerance,Decreased safety awareness,Impaired flexibility,Impaired perceived functional ability,Impaired sensation,Postural dysfunction,Improper body mechanics  Visit Diagnosis: Muscle weakness (generalized)  Other abnormalities of gait and mobility     Problem List Patient Active Problem List   Diagnosis Date Noted  . Physical deconditioning 03/16/2020  . Abnormal findings on diagnostic imaging of lung 03/16/2020  . AF (paroxysmal atrial fibrillation) ([place])   . Acquired thrombophilia ([place])   . Acute on chronic respiratory failure with hypoxia ([place]) 02/21/2020  . Acute on chronic respiratory failure with hypoxemia ([place]) 02/21/2020  . Atrial fibrillation, chronic ([place]) 02/09/2020  . Dyslipidemia 02/09/2020  . Diabetes mellitus type 2 in obese ([place]) 02/09/2020  . Acquired hypothyroidism 02/09/2020  . Thrush, oral 02/09/2020  . Morbid obesity with BMI of 60.0-69.9, adult ([place]) 02/03/2020  . Chronic anticoagulation 12/30/2019  . OSA on CPAP 08/04/2019  . Atrial fibrillation with RVR ([place]) 10/18/2018  . Acute hypoxemic respiratory failure ([place]) 03/29/2018  . Acute on chronic diastolic CHF (congestive heart failure) ([place]) 02/04/2018  . Lymphedema 02/04/2018  . Essential hypertension 12/09/2017  . Long term (current) use of aromatase inhibitors 11/17/2017  . Elevated alkaline phosphatase level 05/20/2017  . Goals of care, counseling/discussion 05/14/2017  . Chronic renal disease, stage III ([place]) 07/08/2015  . Iron deficiency anemia 07/08/2015  . Acute renal failure superimposed on stage 3 chronic kidney disease ([place]) 10/22/2014  . History of breast cancer 10/22/2014  . Elevated troponin 10/22/2014  . Pulmonary emboli ([place]) 10/18/2014  . History of pulmonary embolus (PE) 10/18/2014  . Candidiasis of breast  07/06/2014  . Malignant neoplasm of right female breast ([place]) 07/24/2012   Karl Luke PT, DPT Netta Corrigan 03/23/2020, 6:03 PM  [place] MAIN [facility] SERVICES [address] [place], [place], [address] Phone: [phone]   Fax:  [phone]  Name: Nicole Schmidt MRN: [id number] Date of Birth: 08-12-1965

## 2020-03-24 ENCOUNTER — Telehealth: Payer: Self-pay | Admitting: Cardiovascular Disease

## 2020-03-24 NOTE — Telephone Encounter (Signed)
Patient states she was at physical therapy yesterday and her heart rate was below, after walking around. Today it was 27, and just now it is 76. Please call to discuss.

## 2020-03-24 NOTE — Telephone Encounter (Signed)
Spoke with the patient. Patient sts that she had a PT session yesterday and her physical therapist noted that her HR's with ambulation would read in the 40's bpm. She was unable to get her heart rate up. Patients PT advised her to f/u with her Cardiologist. Patient is asymptomatic. She denies pre-syncope of syncope. She is taking Metoprolol 50 mg bid. Patient sts that her HR at home has been in the low 40's at times.  Patient is scheduled to see Dr. Fletcher Anon in April. Moved the patients appt up to tomorrow at 3:20pm. Patient is aware of the new appt date and time.

## 2020-03-25 ENCOUNTER — Other Ambulatory Visit: Payer: Self-pay

## 2020-03-25 ENCOUNTER — Encounter: Payer: Self-pay | Admitting: Cardiovascular Disease

## 2020-03-25 ENCOUNTER — Ambulatory Visit: Payer: BC Managed Care – PPO | Admitting: Cardiovascular Disease

## 2020-03-25 ENCOUNTER — Ambulatory Visit: Payer: BC Managed Care – PPO | Admitting: Physical Therapy

## 2020-03-25 VITALS — BP 134/72 | HR 52 | Ht 64.0 in | Wt 348.0 lb

## 2020-03-25 DIAGNOSIS — G4733 Obstructive sleep apnea (adult) (pediatric): Secondary | ICD-10-CM

## 2020-03-25 DIAGNOSIS — Z86711 Personal history of pulmonary embolism: Secondary | ICD-10-CM

## 2020-03-25 DIAGNOSIS — I4819 Other persistent atrial fibrillation: Secondary | ICD-10-CM | POA: Diagnosis not present

## 2020-03-25 DIAGNOSIS — I5032 Chronic diastolic (congestive) heart failure: Secondary | ICD-10-CM

## 2020-03-25 DIAGNOSIS — I10 Essential (primary) hypertension: Secondary | ICD-10-CM

## 2020-03-25 DIAGNOSIS — R2689 Other abnormalities of gait and mobility: Secondary | ICD-10-CM | POA: Diagnosis not present

## 2020-03-25 DIAGNOSIS — M6281 Muscle weakness (generalized): Secondary | ICD-10-CM | POA: Diagnosis not present

## 2020-03-25 DIAGNOSIS — Z9989 Dependence on other enabling machines and devices: Secondary | ICD-10-CM

## 2020-03-25 MED ORDER — METOPROLOL TARTRATE 25 MG PO TABS: 25.0000 mg | ORAL_TABLET | Freq: Two times a day (BID) | ORAL | 2 refills | Status: DC

## 2020-03-25 NOTE — Patient Instructions (Signed)
Medication Instructions:  Your physician has recommended you make the following change in your medication:   REDUCE Metoprolol to 25 mg twice daily. [facility]  *If you need a refill on your cardiac medications before your next appointment, please call your pharmacy*   Lab Work: None ordered If you have labs (blood work) drawn today and your tests are completely normal, you will receive your results only by: Marland Kitchen MyChart Message (if you have MyChart) OR . A paper copy in the mail If you have any lab test that is abnormal or we need to change your treatment, we will call you to review the results.   Testing/Procedures: None ordered   Follow-Up: At [facility], you and your health needs are our priority.  As part of our continuing mission to provide you with exceptional heart care, we have created designated Provider Care Teams.  These Care Teams include your primary Cardiologist (physician) and Advanced Practice Providers (APPs -  Physician Assistants and Nurse Practitioners) who all work together to provide you with the care you need, when you need it.  We recommend signing up for the patient portal called "MyChart".  Sign up information is provided on this After Visit Summary.  MyChart is used to connect with patients for Virtual Visits (Telemedicine).  Patients are able to view lab/test results, encounter notes, upcoming appointments, etc.  Non-urgent messages can be sent to your provider as well.   To learn more about what you can do with MyChart, go to [email or web address].    Your next appointment:   As planned in April  The format for your next appointment:   In Person  Provider:   You may see Kathlyn Sacramento, MD or one of the following Advanced Practice Providers on your designated Care Team:    Murray Hodgkins, NP  Christell Faith, PA-C  Marrianne Mood, PA-C  Cadence B[place], [place]  Laurann [place], NP    Other Instructions N/A

## 2020-03-25 NOTE — Progress Notes (Signed)
Cardiology Office Note   Date:  03/25/2020   ID:  Nicole Schmidt, DOB 1965-11-13, MRN [id number]  PCP:  Lenard Simmer, MD  Cardiologist:   Kathlyn Sacramento, MD   Chief Complaint  Patient presents with  . Follow-up    Low heart rate per telephone note from yesterday      History of Present Illness: Nicole Schmidt is a 55 y.o. female who presents for for follow-up visit regarding atrial fibrillation and chronic diastolic heart failure. She has known history of breast cancer status post radiation, diabetes mellitus, essential hypertension, chronic kidney disease, thyroid disease, morbid obesity, sleep apnea on CPAP, anemia of chronic disease, gout, pulmonary embolism and chronic diastolic heart failure. She was hospitalized in September 2020 with A. fib with RVR.  She was diagnosed with community-acquired pneumonia which was treated with antibiotics.  She was started on metoprolol 50 mg twice daily for atrial fibrillation.  She was already on Eliquis for previous pulmonary embolism and this was continued.  Echocardiogram showed normal LV systolic function with diastolic dysfunction and moderate pulmonary hypertension.  She underwent successful cardioversion in October, 2020.  No recurrent atrial fibrillation since then.     She was hospitalized in December for acute respiratory distress due to heart failure and pneumonia.  Echocardiogram showed an EF of 65 to 70% with severe LVH.  Spironolactone was discontinued due to hyperkalemia. She has been getting physical therapy and was noted to be bradycardic with heart rate in the 40s.  She denies dizziness or syncope.  She reports stable exertional dyspnea and no chest pain.  Past Medical History:  Diagnosis Date  . (HFpEF) heart failure with preserved ejection fraction ([place])    a. 10/2018 Echo: EF 60-65%, diast dysfxn, RVSP 50.7 mmHg, mildly dil LA.  Marland Kitchen Acquired hypothyroidism 02/09/2020  . Anemia   . Arthritis   . Breast cancer ([place])  2014   right breast cancer  . Breast cancer of upper-inner quadrant of right female breast ([place])    Right breast invasive CA and DCIS , 7 mm T1,N0,M0. Er/PR pos, her 2 negative.  Margins 1 mm.  . CHF (congestive heart failure) ([place])   . CKD (chronic kidney disease), stage III ([place])   . Diabetes mellitus type 2 in obese ([place]) 02/09/2020  . Diabetes mellitus without complication ([place])   . Dyslipidemia 02/09/2020  . Essential hypertension   . Gout   . Hypothyroidism   . Menopause    age 37  . Morbid obesity ([place])   . Persistent atrial fibrillation ([place])    a. Dx 10/2018 in setting of PNA. CHA2DS2VASc = 4-->Eliquis; b. 11/2018 s/p successful DCCV.  Marland Kitchen Personal history of radiation therapy   . Pulmonary embolism ([place]) 10/2014   a. Chronic eliquis.  . Thyroid goiter     Past Surgical History:  Procedure Laterality Date  . BREAST BIOPSY Right 2014   breast ca  . BREAST EXCISIONAL BIOPSY Right 07/19/2012   breast ca  . BREAST SURGERY Right 2014   with sentinel node bx subareaolar duct excision  . CARDIOVERSION N/A 11/28/2018   Procedure: CARDIOVERSION;  Surgeon: Minna Merritts, MD;  Location: [facility] ORS;  Service: Cardiovascular;  Laterality: N/A;  . COLONOSCOPY WITH PROPOFOL N/A 01/30/2017   Procedure: COLONOSCOPY WITH PROPOFOL;  Surgeon: Christene Lye, MD;  Location: [facility] ENDOSCOPY;  Service: Endoscopy;  Laterality: N/A;     Current Outpatient Medications  Medication Sig Dispense Refill  . acetaminophen (TYLENOL) 500  MG tablet Take 1,000 mg by mouth every 6 (six) hours as needed (for pain).    Marland Kitchen amLODipine (NORVASC) 5 MG tablet Take 5 mg by mouth 2 (two) times daily.    Marland Kitchen atorvastatin (LIPITOR) 20 MG tablet Take 20 mg by mouth at bedtime.     . calcium carbonate (OSCAL) 1500 (600 Ca) MG TABS tablet Take 600 mg of elemental calcium by mouth daily.     . Cholecalciferol (VITAMIN D3) 50000 units TABS Take 50,000 Units by mouth every Friday.     Marland Kitchen CINNAMON PO Take 1,000 mg  by mouth daily.     . cloNIDine (CATAPRES) 0.2 MG tablet Take 1 tablet (0.2 mg total) by mouth 2 (two) times daily. 60 tablet 0  . CRANBERRY CONCENTRATE PO Take 1 capsule by mouth daily.    . Dulaglutide 1.5 MG/0.5ML SOPN Inject 1.5 mg into the skin every 7 (seven) days.    Marland Kitchen ELIQUIS 5 MG TABS tablet TAKE 1 TABLET TWICE A DAY 563 tablet 1  . folic acid (FOLVITE) 149 MCG tablet Take 800 mcg by mouth daily.     . furosemide (LASIX) 40 MG tablet Take 1 tablet (40 mg total) by mouth daily.    Marland Kitchen KLOR-CON M20 20 MEQ tablet TAKE 1 TABLET DAILY 90 tablet 3  . levothyroxine (SYNTHROID, LEVOTHROID) 112 MCG tablet Take 112 mcg daily before breakfast by mouth.    . Melatonin 10 MG TABS Take 10 mg by mouth at bedtime.    . metoprolol tartrate (LOPRESSOR) 50 MG tablet TAKE 1 TABLET TWICE A DAY 180 tablet 3  . vitamin C (ASCORBIC ACID) 500 MG tablet Take 500 mg by mouth 2 (two) times daily.     No current facility-administered medications for this visit.    Allergies:   Patient has no known allergies.    Social History:  The patient  reports that she has never smoked. She has never used smokeless tobacco. She reports that she does not drink alcohol and does not use drugs.   Family History:  The patient's family history includes Atrial fibrillation in her mother; Colon cancer (age of onset: 53) in her cousin; Diabetes in her father; Heart attack in her mother; Heart failure in her mother; Hypertension in her father and mother; Parkinson's disease in her father; Rheum arthritis in her mother.    ROS:  Please see the history of present illness.   Otherwise, review of systems are positive for none.   All other systems are reviewed and negative.    PHYSICAL EXAM: VS:  BP 134/72   Pulse (!) 52   Ht 5\' 4"  (1.626 m)   Wt (!) 348 lb (157.9 kg)   LMP 07/17/2012   BMI 59.73 kg/m  , BMI Body mass index is 59.73 kg/m. GEN: Well nourished, well developed, in no acute distress  HEENT: normal  Neck: no JVD,  carotid bruits, or masses Cardiac: RRR; no murmurs, rubs, or gallops, trace edema  Respiratory:  clear to auscultation bilaterally, normal work of breathing GI: soft, nontender, nondistended, + BS MS: no deformity or atrophy  Skin: warm and dry, no rash Neuro:  Strength and sensation are intact Psych: euthymic mood, full affect   EKG:  EKG is ordered today. The ekg ordered today demonstrates sinus bradycardia with no significant ST or T wave changes.  Recent Labs: 02/09/2020: TSH 0.727 02/17/2020: Magnesium 2.3 02/20/2020: ALT 26; B Natriuretic Peptide 210.7 02/27/2020: BUN 40; Creatinine, Ser 1.83; Hemoglobin 9.9; Platelets  232; Potassium 5.1; Sodium 144    Lipid Panel    Component Value Date/Time   CHOL 122 02/09/2020 1340   TRIG 153 (H) 02/09/2020 1340   HDL 49 02/09/2020 1340   CHOLHDL 2.5 02/09/2020 1340   VLDL 31 02/09/2020 1340   LDLCALC 42 02/09/2020 1340      Wt Readings from Last 3 Encounters:  03/25/20 (!) 348 lb (157.9 kg)  03/16/20 (!) 355 lb (161 kg)  02/27/20 (!) 364 lb (165.1 kg)        No flowsheet data found.    ASSESSMENT AND PLAN:  1. Persistent atrial fibrillation: She continues to be in sinus rhythm but she has been more bradycardic lately.  I elected to decrease metoprolol to 25 mg twice daily.  We could consider switching her to carvedilol in the future if needed. She is tolerating anticoagulation with Eliquis. Chads vas score is 4 and currently she is on Eliquis.    2. Chronic diastolic heart failure: She appears to be euvolemic on furosemide 40 mg once daily.  3.  Essential hypertension: Blood pressure is well controlled on current medications.    4. Morbid obesity: She is trying to lose weight.  5. History of pulmonary embolism: On anticoagulation with Eliquis.  6.  Obstructive sleep apnea: Doing better with CPAP.  7.  Chronic kidney disease: Most recent labs showed a creatinine of 1.83.    Disposition:   FU with me in 2  months  Signed,  Kathlyn Sacramento, MD  03/25/2020 3:16 PM    [place]

## 2020-03-25 NOTE — Therapy (Signed)
[place] MAIN [facility] SERVICES [address] [place], [place], [address] Phone: [phone]   Fax:  [phone]  Physical Therapy Treatment  Patient Details  Name: Nicole Schmidt MRN: [id number] Date of Birth: 04-05-1965 Referring Provider (PT): [place], [place]   Encounter Date: 03/25/2020   PT End of Session - 03/25/20 1349    Visit Number 8    Number of Visits 16    Date for PT Re-Evaluation 04/21/20    Authorization Type 1/10 eval 02/25/20    PT Start Time 1345    PT Stop Time 1430    PT Time Calculation (min) 45 min    Equipment Utilized During Treatment Gait belt;Oxygen   2 L via nasal cannula   Activity Tolerance Patient limited by fatigue;Other (comment)   Sp02 limitations   Behavior During Therapy [facility] for tasks assessed/performed           Past Medical History:  Diagnosis Date  . (HFpEF) heart failure with preserved ejection fraction ([place])    a. 10/2018 Echo: EF 60-65%, diast dysfxn, RVSP 50.7 mmHg, mildly dil LA.  Marland Kitchen Acquired hypothyroidism 02/09/2020  . Anemia   . Arthritis   . Breast cancer ([place]) 2014   right breast cancer  . Breast cancer of upper-inner quadrant of right female breast ([place])    Right breast invasive CA and DCIS , 7 mm T1,N0,M0. Er/PR pos, her 2 negative.  Margins 1 mm.  . CHF (congestive heart failure) ([place])   . CKD (chronic kidney disease), stage III ([place])   . Diabetes mellitus type 2 in obese ([place]) 02/09/2020  . Diabetes mellitus without complication ([place])   . Dyslipidemia 02/09/2020  . Essential hypertension   . Gout   . Hypothyroidism   . Menopause    age 55  . Morbid obesity ([place])   . Persistent atrial fibrillation ([place])    a. Dx 10/2018 in setting of PNA. CHA2DS2VASc = 4-->Eliquis; b. 11/2018 s/p successful DCCV.  Marland Kitchen Personal history of radiation therapy   . Pulmonary embolism ([place]) 10/2014   a. Chronic eliquis.  . Thyroid goiter     Past Surgical History:  Procedure Laterality Date  .  BREAST BIOPSY Right 2014   breast ca  . BREAST EXCISIONAL BIOPSY Right 07/19/2012   breast ca  . BREAST SURGERY Right 2014   with sentinel node bx subareaolar duct excision  . CARDIOVERSION N/A 11/28/2018   Procedure: CARDIOVERSION;  Surgeon: Minna Merritts, MD;  Location: [facility] ORS;  Service: Cardiovascular;  Laterality: N/A;  . COLONOSCOPY WITH PROPOFOL N/A 01/30/2017   Procedure: COLONOSCOPY WITH PROPOFOL;  Surgeon: Christene Lye, MD;  Location: [facility] ENDOSCOPY;  Service: Endoscopy;  Laterality: N/A;    There were no vitals filed for this visit.   Subjective Assessment - 03/25/20 1348    Subjective Patient reports that she is going to her cardiology today for a follow up for low HR readings. No injuries or falls since last therapy session.    Pertinent History Patient presents to physical therapy for weakness. Has been to ED twice since Christmas.Was discharged from hospital on 02/23/20.  Patient presents with 2 L of 02 via nasal cannula which is new to her. Is monitoring her Sp02 at home and noticing it is low. PMH includes HFpEF, hypothyroidism, anemia, arthritis, breast cancer, CHF, CKD, DM, Dyslipidemia, HTN, gout, hypothyroidism, morbid obesity, a fib, PE, thyroid goiter.    Limitations Lifting;Standing;Walking;House hold activities;Other (comment)    How long  can you sit comfortably? no problem    How long can you stand comfortably? several minutes    How long can you walk comfortably? not sure. gets winded quickly.    Currently in Pain? No/denies           Beginning of treatment session: SaO2: 100% HR: ranging form 55-60 bpm   Gait training with continuous 2L of O2 via [place]:  1) 10 meters: 47 seconds, SaO2: 98% ; HR: 72  2) 10 meters: 47 seconds, SaO2: 99%; HR: 73 3) 10 meters: 44 seconds, SaO2: 99%, HR: 74  Sit to stands x 10 without UE support SaO2: 99%, HR: 67   Sit to stands x 10 without UE support SaO2: 99% ;HR: 65  Clinical Impression: Patient  tolerated short endurance activities with good tolerance to activity. She requires multiple therapeutic rest breaks due to muscle fatigue in BLE. Her SaO2 and HR were checked between each exercise and was within normal limits. Therapist provided education on progress patient has made since start with improved endurance while maintaining SaO2 WNL while being on 2L of O2 via [place]. Patient will continue to benefit from skilled physical therapy to improve generalized strength, ROM, and capacity for functional activity.           PT Short Term Goals - 02/25/20 1719      PT SHORT TERM GOAL #1   Title Patient will be independent in home exercise program to improve strength/mobility for better functional independence with ADLs.    Baseline 1/12: HEP given    Time 2    Period Weeks    Status New    Target Date 03/10/20             PT Long Term Goals - 02/25/20 1720      PT LONG TERM GOAL #1   Title Patient will increase FOTO score to equal to or greater than   54%  to demonstrate statistically significant improvement in mobility and quality of life.    Baseline 1/12: 45%    Time 8    Period Weeks    Status New    Target Date 04/21/20      PT LONG TERM GOAL #2   Title Patient will increase 10 meter walk test to >1.27m/s as to improve gait speed for better community ambulation and to reduce fall risk while maintaining SP02>90%    Baseline 1/12: 0.7 m/s    Time 8    Period Weeks    Status New    Target Date 04/21/20      PT LONG TERM GOAL #3   Title Patient will increase BLE gross strength to 4+/5 as to improve functional strength for independent gait, increased standing tolerance and increased ADL ability.    Baseline 1/12: see note    Time 8    Period Weeks    Status New    Target Date 04/21/20      PT LONG TERM GOAL #4   Title Patient will ambulate >200 ft without an AD with Sp02>90% for increased functional mobility.    Baseline 1/12: 68 ft wtih Sp02 drop    Time 8     Period Weeks    Status New    Target Date 04/21/20                 Plan - 03/25/20 1655    Clinical Impression Statement Patient tolerated short endurance activities with good tolerance to activity. She requires multiple  therapeutic rest breaks due to muscle fatigue in BLE. Her SaO2 and HR were checked between each exercise and was within normal limits. Therapist provided education on progress patient has made since start with improved endurance while maintaining SaO2 WNL while being on 2L of O2 via [place]. Patient will continue to benefit from skilled physical therapy to improve generalized strength, ROM, and capacity for functional activity.    Personal Factors and Comorbidities Age;Comorbidity 3+;Finances;Fitness;Past/Current Experience;Transportation    Comorbidities HFpEF, hypothyroidism, anemia, arthritis, breast cancer, CHF, CKD, DM, Dyslipidemia, HTN, gout, hypothyroidism, morbid obesity, a fib, PE, thyroid goiter.    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    Rehab Potential Fair    PT Frequency 2x / week    PT Duration 8 weeks    PT Treatment/Interventions Patient/family education;Therapeutic activities;Therapeutic exercise;Functional mobility training;Balance training;Neuromuscular re-education;ADLs/Self Care Home Management;Aquatic Therapy;Canalith Repostioning;Cryotherapy;Electrical Stimulation;Iontophoresis 4mg /ml Dexamethasone;Moist Heat;Traction;Ultrasound;DME Instruction;Gait training;Stair training;Orthotic Fit/Training;Manual techniques;Dry needling;Taping;Vestibular;Visual/perceptual remediation/compensation    PT Next Visit Plan strengthening ,  monitor Sp02    PT Home Exercise Plan see above    Consulted and Agree with Plan of Care Patient           Patient will benefit from skilled therapeutic intervention in order to improve the following deficits and impairments:  Abnormal gait,Decreased balance,Decreased endurance,Decreased mobility,Difficulty walking,Obesity,Decreased strength,Cardiopulmonary status limiting activity,Decreased activity tolerance,Decreased safety awareness,Impaired flexibility,Impaired perceived functional ability,Impaired sensation,Postural dysfunction,Improper body mechanics  Visit Diagnosis: Muscle weakness (generalized)  Other abnormalities of gait and mobility     Problem List Patient Active Problem List   Diagnosis Date Noted  . Physical deconditioning 03/16/2020  . Abnormal findings on diagnostic imaging of lung 03/16/2020  . AF (paroxysmal atrial fibrillation) ([place])   . Acquired thrombophilia ([place])   . Acute on chronic respiratory failure with hypoxia ([place]) 02/21/2020  . Acute on chronic respiratory failure with hypoxemia ([place]) 02/21/2020  . Atrial fibrillation, chronic ([place]) 02/09/2020  . Dyslipidemia 02/09/2020  . Diabetes mellitus type 2 in obese ([place]) 02/09/2020  . Acquired hypothyroidism 02/09/2020  . Thrush, oral 02/09/2020  . Morbid obesity with BMI of 60.0-69.9, adult ([place]) 02/03/2020  . Chronic anticoagulation 12/30/2019  . OSA on CPAP 08/04/2019  . Atrial fibrillation with RVR ([place]) 10/18/2018  . Acute hypoxemic respiratory failure ([place]) 03/29/2018  . Acute on chronic diastolic CHF (congestive heart failure) ([place]) 02/04/2018  . Lymphedema 02/04/2018  . Essential hypertension 12/09/2017  . Long term (current) use of aromatase inhibitors 11/17/2017  . Elevated alkaline phosphatase level 05/20/2017  . Goals of care, counseling/discussion 05/14/2017  . Chronic renal disease, stage III ([place]) 07/08/2015  . Iron deficiency anemia 07/08/2015  . Acute renal failure superimposed on  stage 3 chronic kidney disease ([place]) 10/22/2014  . History of breast cancer 10/22/2014  . Elevated troponin 10/22/2014  . Pulmonary emboli ([place]) 10/18/2014  . History of pulmonary embolus (PE) 10/18/2014  . Candidiasis of breast 07/06/2014  . Malignant neoplasm of right female breast [facility]) 07/24/2012    Karl Luke PT, DPT Netta Corrigan 03/25/2020, 4:56 PM  [place] MAIN [facility] SERVICES [address] [place], [place], [address] Phone: [phone]   Fax:  [phone]  Name: Nicole Schmidt MRN: [id number] Date of Birth: 11/17/1965

## 2020-03-30 ENCOUNTER — Ambulatory Visit: Payer: BC Managed Care – PPO

## 2020-03-30 ENCOUNTER — Other Ambulatory Visit: Payer: Self-pay

## 2020-03-30 DIAGNOSIS — E119 Type 2 diabetes mellitus without complications: Secondary | ICD-10-CM | POA: Diagnosis not present

## 2020-03-30 DIAGNOSIS — I5043 Acute on chronic combined systolic (congestive) and diastolic (congestive) heart failure: Secondary | ICD-10-CM | POA: Diagnosis not present

## 2020-03-30 DIAGNOSIS — D352 Benign neoplasm of pituitary gland: Secondary | ICD-10-CM | POA: Diagnosis not present

## 2020-03-30 DIAGNOSIS — I2699 Other pulmonary embolism without acute cor pulmonale: Secondary | ICD-10-CM | POA: Diagnosis not present

## 2020-03-30 DIAGNOSIS — E785 Hyperlipidemia, unspecified: Secondary | ICD-10-CM | POA: Diagnosis not present

## 2020-03-30 DIAGNOSIS — E669 Obesity, unspecified: Secondary | ICD-10-CM | POA: Diagnosis not present

## 2020-03-30 DIAGNOSIS — G4733 Obstructive sleep apnea (adult) (pediatric): Secondary | ICD-10-CM | POA: Diagnosis not present

## 2020-03-30 DIAGNOSIS — R2689 Other abnormalities of gait and mobility: Secondary | ICD-10-CM

## 2020-03-30 DIAGNOSIS — M6281 Muscle weakness (generalized): Secondary | ICD-10-CM

## 2020-03-30 DIAGNOSIS — E039 Hypothyroidism, unspecified: Secondary | ICD-10-CM | POA: Diagnosis not present

## 2020-03-30 NOTE — Therapy (Signed)
[place] MAIN [facility] SERVICES [address] [place], [place], [address] Phone: [phone]   Fax:  [phone]  Physical Therapy Treatment  Patient Details  Name: Nicole Schmidt MRN: [id number] Date of Birth: 1965-10-13 Referring Provider (PT): [place], [place]   Encounter Date: 03/30/2020   PT End of Session - 03/30/20 1329    Visit Number 9    Number of Visits 16    Date for PT Re-Evaluation 04/21/20    Authorization Type 1/10 eval 02/25/20    PT Start Time 1300    PT Stop Time 1345    PT Time Calculation (min) 45 min    Equipment Utilized During Treatment Gait belt;Oxygen   2 L via nasal cannula   Activity Tolerance Patient limited by fatigue;Other (comment)   Sp02 limitations   Behavior During Therapy [facility] for tasks assessed/performed           Past Medical History:  Diagnosis Date  . (HFpEF) heart failure with preserved ejection fraction ([place])    a. 10/2018 Echo: EF 60-65%, diast dysfxn, RVSP 50.7 mmHg, mildly dil LA.  Marland Kitchen Acquired hypothyroidism 02/09/2020  . Anemia   . Arthritis   . Breast cancer ([place]) 2014   right breast cancer  . Breast cancer of upper-inner quadrant of right female breast ([place])    Right breast invasive CA and DCIS , 7 mm T1,N0,M0. Er/PR pos, her 2 negative.  Margins 1 mm.  . CHF (congestive heart failure) ([place])   . CKD (chronic kidney disease), stage III ([place])   . Diabetes mellitus type 2 in obese ([place]) 02/09/2020  . Diabetes mellitus without complication ([place])   . Dyslipidemia 02/09/2020  . Essential hypertension   . Gout   . Hypothyroidism   . Menopause    age 55  . Morbid obesity ([place])   . Persistent atrial fibrillation ([place])    a. Dx 10/2018 in setting of PNA. CHA2DS2VASc = 4-->Eliquis; b. 11/2018 s/p successful DCCV.  Marland Kitchen Personal history of radiation therapy   . Pulmonary embolism ([place]) 10/2014   a. Chronic eliquis.  . Thyroid goiter     Past Surgical History:  Procedure Laterality Date  .  BREAST BIOPSY Right 2014   breast ca  . BREAST EXCISIONAL BIOPSY Right 07/19/2012   breast ca  . BREAST SURGERY Right 2014   with sentinel node bx subareaolar duct excision  . CARDIOVERSION N/A 11/28/2018   Procedure: CARDIOVERSION;  Surgeon: Minna Merritts, MD;  Location: [facility] ORS;  Service: Cardiovascular;  Laterality: N/A;  . COLONOSCOPY WITH PROPOFOL N/A 01/30/2017   Procedure: COLONOSCOPY WITH PROPOFOL;  Surgeon: Christene Lye, MD;  Location: [facility] ENDOSCOPY;  Service: Endoscopy;  Laterality: N/A;    There were no vitals filed for this visit.    Resting Vitals  Subjective Assessment - 03/30/20 1402    Subjective Patient reports doing okay today with no new complaints. Reports she was just seen by her PCP- Dr. Ronnald Collum and prescribed 2 new inhalers today. Patient verbalized good understanding of her HEP including seated LE strengthening exercises.    Pertinent History Patient presents to physical therapy for weakness. Has been to ED twice since Christmas.Was discharged from hospital on 02/23/20.  Patient presents with 2 L of 02 via nasal cannula which is new to her. Is monitoring her Sp02 at home and noticing it is low. PMH includes HFpEF, hypothyroidism, anemia, arthritis, breast cancer, CHF, CKD, DM, Dyslipidemia, HTN, gout, hypothyroidism, morbid obesity, a fib, PE,  thyroid goiter.    Limitations Lifting;Standing;Walking;House hold activities;Other (comment)    How long can you sit comfortably? no problem    How long can you stand comfortably? several minutes    How long can you walk comfortably? not sure. gets winded quickly.    Currently in Pain? No/denies          O2 sat= 100% on 2L and HR=55 bpm   Therex:  Standing Hip Flex Standing Hip Abd Standing hip ext Standing knee flex Standing [place] Standing calf raises  Patient performed above with VCs and visual demonstration for above exercises. Patient performed well with instruction to count out loud for  proper breathing and for slow muscle control. Patient reported a 3/10 on Modified BORG and able to perform all exercises without a rest break.  Gait endurance - patient amb approx 155 feet with 2L - O2 sat =94% back up to 98% with instruction in pursed lip breathing. Patient also instructed in Modified BORG RPE Scale.   Seated Therex:   Seated hip march 2# Seated knee ext 2#  Seated Hip Abd 2# 10 reps each leg with VC for technique. Patient demo no difficulty with exercises today and maintained O2 sat > 95% with all seated exercises.                        PT Education - 03/30/20 1312    Education Details Pursed lip breathing, Instructed in Modified BORG RPE, specific exercise technique    Person(s) Educated Patient    Methods Explanation;Demonstration;Verbal cues    Comprehension Verbalized understanding;Returned demonstration;Need further instruction;Verbal cues required            PT Short Term Goals - 02/25/20 1719      PT SHORT TERM GOAL #1   Title Patient will be independent in home exercise program to improve strength/mobility for better functional independence with ADLs.    Baseline 1/12: HEP given    Time 2    Period Weeks    Status New    Target Date 03/10/20             PT Long Term Goals - 02/25/20 1720      PT LONG TERM GOAL #1   Title Patient will increase FOTO score to equal to or greater than   54%  to demonstrate statistically significant improvement in mobility and quality of life.    Baseline 1/12: 45%    Time 8    Period Weeks    Status New    Target Date 04/21/20      PT LONG TERM GOAL #2   Title Patient will increase 10 meter walk test to >1.3m/s as to improve gait speed for better community ambulation and to reduce fall risk while maintaining SP02>90%    Baseline 1/12: 0.7 m/s    Time 8    Period Weeks    Status New    Target Date 04/21/20      PT LONG TERM GOAL #3   Title Patient will increase BLE gross strength to  4+/5 as to improve functional strength for independent gait, increased standing tolerance and increased ADL ability.    Baseline 1/12: see note    Time 8    Period Weeks    Status New    Target Date 04/21/20      PT LONG TERM GOAL #4   Title Patient will ambulate >200 ft without an AD with Sp02>90% for increased  functional mobility.    Baseline 1/12: 68 ft wtih Sp02 drop    Time 8    Period Weeks    Status New    Target Date 04/21/20                 Plan - 03/30/20 1425    Clinical Impression Statement Patient performed well with progressive strengthening - able to perform standing LE strengthening with good O2 sat levels. Patient was educated in using breathing techniques for improved functional endurance including pursed lip breathing and patient verbalized and demo good understanding of this technique. She also utilized the Modified BORG RPE to use with walking and exercises and cautioned her to stay at 5/10 or below and utilize rest breaks as needed for improved recovery without undo fatigue.Patient will continue to benefit from skilled physical therapy to improve generalized strength, endurance, and capacity for functional activity.    Personal Factors and Comorbidities Age;Comorbidity 3+;Finances;Fitness;Past/Current Experience;Transportation    Comorbidities HFpEF, hypothyroidism, anemia, arthritis, breast cancer, CHF, CKD, DM, Dyslipidemia, HTN, gout, hypothyroidism, morbid obesity, a fib, PE, thyroid goiter.    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    Rehab Potential Fair    PT Frequency 2x / week    PT Duration 8 weeks    PT  Treatment/Interventions Patient/family education;Therapeutic activities;Therapeutic exercise;Functional mobility training;Balance training;Neuromuscular re-education;ADLs/Self Care Home Management;Aquatic Therapy;Canalith Repostioning;Cryotherapy;Electrical Stimulation;Iontophoresis 4mg /ml Dexamethasone;Moist Heat;Traction;Ultrasound;DME Instruction;Gait training;Stair training;Orthotic Fit/Training;Manual techniques;Dry needling;Taping;Vestibular;Visual/perceptual remediation/compensation    PT Next Visit Plan Review BORG scale with walking/exercises and progress strengthening as appropriate.    PT Home Exercise Plan Issued Modified BORG RPE scale for patient to utilize with wakling/exercises in the home.    Consulted and Agree with Plan of Care Patient           Patient will benefit from skilled therapeutic intervention in order to improve the following deficits and impairments:  Abnormal gait,Decreased balance,Decreased endurance,Decreased mobility,Difficulty walking,Obesity,Decreased strength,Cardiopulmonary status limiting activity,Decreased activity tolerance,Decreased safety awareness,Impaired flexibility,Impaired perceived functional ability,Impaired sensation,Postural dysfunction,Improper body mechanics  Visit Diagnosis: Muscle weakness (generalized)  Other abnormalities of gait and mobility     Problem List Patient Active Problem List   Diagnosis Date Noted  . Physical deconditioning 03/16/2020  . Abnormal findings on diagnostic imaging of lung 03/16/2020  . AF (paroxysmal atrial fibrillation) ([place])   . Acquired thrombophilia ([place])   . Acute on chronic respiratory failure with hypoxia ([place]) 02/21/2020  . Acute on chronic respiratory failure with hypoxemia ([place]) 02/21/2020  . Atrial fibrillation, chronic ([place]) 02/09/2020  . Dyslipidemia 02/09/2020  . Diabetes mellitus type 2 in obese ([place]) 02/09/2020  . Acquired hypothyroidism 02/09/2020  . Thrush, oral 02/09/2020  . Morbid  obesity with BMI of 60.0-69.9, adult ([place]) 02/03/2020  . Chronic anticoagulation 12/30/2019  . OSA on CPAP 08/04/2019  . Atrial fibrillation with RVR ([place]) 10/18/2018  . Acute hypoxemic respiratory failure ([place]) 03/29/2018  . Acute on chronic diastolic CHF (congestive heart failure) ([place]) 02/04/2018  . Lymphedema 02/04/2018  . Essential hypertension 12/09/2017  . Long term (current) use of aromatase inhibitors 11/17/2017  . Elevated alkaline phosphatase level 05/20/2017  . Goals of care, counseling/discussion 05/14/2017  . Chronic renal disease, stage III ([place]) 07/08/2015  . Iron deficiency anemia 07/08/2015  . Acute renal failure superimposed on stage 3 chronic kidney disease ([place]) 10/22/2014  . History of breast cancer 10/22/2014  . Elevated troponin 10/22/2014  .  Pulmonary emboli ([place]) 10/18/2014  . History of pulmonary embolus ([place]) 10/18/2014  . Candidiasis of breast 07/06/2014  . Malignant neoplasm of right female breast ([place]) 07/24/2012    Lewis Moccasin, PT 03/30/2020, 2:29 PM  [place] MAIN [facility] SERVICES [address] [place], [place], [address] Phone: [phone]   Fax:  [phone]  Name: IRELYN PERFECTO MRN: [id number] Date of Birth: 01-Nov-1965

## 2020-04-01 ENCOUNTER — Other Ambulatory Visit: Payer: Self-pay

## 2020-04-01 ENCOUNTER — Ambulatory Visit: Payer: BC Managed Care – PPO

## 2020-04-01 VITALS — BP 134/55 | HR 54

## 2020-04-01 DIAGNOSIS — R2689 Other abnormalities of gait and mobility: Secondary | ICD-10-CM

## 2020-04-01 DIAGNOSIS — M6281 Muscle weakness (generalized): Secondary | ICD-10-CM | POA: Diagnosis not present

## 2020-04-01 NOTE — Therapy (Signed)
[place] MAIN [facility] SERVICES [address] [place], [place], [address] Phone: [phone]   Fax:  [phone]  Physical Therapy Treatment/ Physical Therapy Progress Note   Dates of reporting period  02/25/2020   to   04/01/2020   Patient Details  Name: Nicole Schmidt MRN: [id number] Date of Birth: Jun 30, 1965 Referring Provider (PT): morayati, [place]   Encounter Date: 04/01/2020   PT End of Session - 04/01/20 1847    Visit Number 10    Number of Visits 16    Date for PT Re-Evaluation 04/21/20    Authorization Type 1/10 eval 02/25/20    PT Start Time 1431    PT Stop Time 1515    PT Time Calculation (min) 44 min    Equipment Utilized During Treatment Gait belt;Oxygen   2 L via nasal cannula   Activity Tolerance Patient limited by fatigue   Sp02 limitations   Behavior During Therapy [facility] for tasks assessed/performed           Past Medical History:  Diagnosis Date  . (HFpEF) heart failure with preserved ejection fraction ([place])    a. 10/2018 Echo: EF 60-65%, diast dysfxn, RVSP 50.7 mmHg, mildly dil LA.  Marland Kitchen Acquired hypothyroidism 02/09/2020  . Anemia   . Arthritis   . Breast cancer ([place]) 2014   right breast cancer  . Breast cancer of upper-inner quadrant of right female breast ([place])    Right breast invasive CA and DCIS , 7 mm T1,N0,M0. Er/PR pos, her 2 negative.  Margins 1 mm.  . CHF (congestive heart failure) ([place])   . CKD (chronic kidney disease), stage III ([place])   . Diabetes mellitus type 2 in obese ([place]) 02/09/2020  . Diabetes mellitus without complication ([place])   . Dyslipidemia 02/09/2020  . Essential hypertension   . Gout   . Hypothyroidism   . Menopause    age 55  . Morbid obesity ([place])   . Persistent atrial fibrillation ([place])    a. Dx 10/2018 in setting of PNA. CHA2DS2VASc = 4-->Eliquis; b. 11/2018 s/p successful DCCV.  Marland Kitchen Personal history of radiation therapy   . Pulmonary embolism ([place]) 10/2014   a. Chronic eliquis.  .  Thyroid goiter     Past Surgical History:  Procedure Laterality Date  . BREAST BIOPSY Right 2014   breast ca  . BREAST EXCISIONAL BIOPSY Right 07/19/2012   breast ca  . BREAST SURGERY Right 2014   with sentinel node bx subareaolar duct excision  . CARDIOVERSION N/A 11/28/2018   Procedure: CARDIOVERSION;  Surgeon: Minna Merritts, MD;  Location: [facility] ORS;  Service: Cardiovascular;  Laterality: N/A;  . COLONOSCOPY WITH PROPOFOL N/A 01/30/2017   Procedure: COLONOSCOPY WITH PROPOFOL;  Surgeon: Christene Lye, MD;  Location: [facility] ENDOSCOPY;  Service: Endoscopy;  Laterality: N/A;    Vitals:   04/01/20 1439  BP: (!) 134/55  Pulse: (!) 54  SpO2: 100%     Subjective Assessment - 04/01/20 1434    Subjective Pt reports she is feeling fine today. Denies pain. Said she saw physician and that they decreased her metoprolol.    Pertinent History Patient presents to physical therapy for weakness. Has been to ED twice since Christmas.Was discharged from hospital on 02/23/20.  Patient presents with 2 L of 02 via nasal cannula which is new to her. Is monitoring her Sp02 at home and noticing it is low. PMH includes HFpEF, hypothyroidism, anemia, arthritis, breast cancer, CHF, CKD, DM, Dyslipidemia, HTN, gout,  hypothyroidism, morbid obesity, a fib, PE, thyroid goiter.    Limitations Lifting;Standing;Walking;House hold activities;Other (comment)    How long can you sit comfortably? no problem    How long can you stand comfortably? several minutes    How long can you walk comfortably? not sure. gets winded quickly.    Currently in Pain? No/denies           Treatment pt on 2L nasal cannula throughout  FOTO: 54   10MWT 0.76 m/s;  SPO2 93% immediately post-test; one minute post-test SPO2 98%  MMT: pt grossly 4+/5 BLEs, exception hip flexors 4/5 B  Pt ambulated 192 ft before requiring standing rest break where SPO2 was 89%. Her SPO2 reached 95% within one minute of standing rest break. Pt  ambulated additional 29 ft, totaling approx 221 ft.   5xsts: 14.3 seconds; SPO2 99%  HR 73  Seated marches 1x20 no AW; 2.5# AW  1x20; pt reports exercise is "easy"  Assessment: Goals reassessed today. Pt on 2L nasal cannula throughout session. Her SPO2 ranged from 89-99% with testing and exercise. Pt has met FOTO goal (54%) indicating improvement in mobility and QOL. Pt making gains toward remaining goals but still with impaired gait speed (0.76 m/s), functional capacity (192 ft ambulation prior to rest break) and decreased B LE strength and power (see MMT; 5xSTS 14.3 seconds). Pt still has drop in SPO2 with ambulation for 192 ft to 89%, where SPO2 increased to 95% within one minute of standing break. PT added 5xSTS goal to track pt LE power. During MMT of L ankle dorsiflexion pt complained of pain of L 4th and 5th toes after cavitation with MMT. She reported 5-6/10 pain in 4th and 5th toes following MMT. PT removed pt L shoe and sock to examine area. No bruising, cuts, abrasions, redness present, skin temperature WNL. By end of session pt rated L 4th and 5th toe pain 3/10 with sitting but said it increases with weightbearing. PT instructed pt to monitor symptoms. Patient's condition has the potential to improve in response to therapy. Maximum improvement is yet to be obtained. The anticipated improvement is attainable and reasonable in a generally predictable time. Pt will benefit from further skilled therapy to improve B LE strength and power, gait, and functional capacity to improve ease with ADLs and QOL.       PT Education - 04/01/20 1911    Education Details Pt educated on goal reassessment, progress, and areas of improvement/indications for PT    Person(s) Educated Patient    Methods Explanation    Comprehension Verbalized understanding            PT Short Term Goals - 04/01/20 1435      PT SHORT TERM GOAL #1   Title Patient will be independent in home exercise program to improve  strength/mobility for better functional independence with ADLs.    Baseline 1/12: HEP given; 2/17 Pt has no questions regarding HEP, states she "tries to do them a couple times a day."    Time 2    Period Weeks    Status Achieved    Target Date 03/10/20             PT Long Term Goals - 04/01/20 1446      PT LONG TERM GOAL #1   Title Patient will increase FOTO score to equal to or greater than   54%  to demonstrate statistically significant improvement in mobility and quality of life.    Baseline  1/12: 45%; 04/01/2020 54%    Time 8    Period Weeks    Status Achieved      PT LONG TERM GOAL #2   Title Patient will increase 10 meter walk test to >1.17ms as to improve gait speed for better community ambulation and to reduce fall risk while maintaining SP02>90%    Baseline 1/12: 0.7 m/s; 2/17 0.76    Time 8    Period Weeks    Status On-going    Target Date 04/21/20      PT LONG TERM GOAL #3   Title Patient will increase BLE gross strength to 4+/5 as to improve functional strength for independent gait, increased standing tolerance and increased ADL ability.    Baseline 1/12: see note 2/17 MMT of LEs grossly 4+/5 with exception of hip flexors 4/5 B    Time 8    Period Weeks    Status Partially Met    Target Date 04/21/20      PT LONG TERM GOAL #4   Title Patient will ambulate >200 ft without an AD with Sp02>90% for increased functional mobility.    Baseline 1/12: 68 ft wtih Sp02 drop; 2/17 192 ft before requring standing rest break whree SPO2 was 89% took one minute to increase to 95%    Time 8    Period Weeks    Status Partially Met    Target Date 04/21/20      PT LONG TERM GOAL #5   Title Patient (< 629years old) will complete five times sit to stand test in < 10 seconds indicating an increased LE strength and improved balance.    Baseline 04/01/2020 5xSTS completed 14.3 seconds.    Time 4    Period Weeks    Status New    Target Date 04/29/20                 Plan  - 04/01/20 1910    Clinical Impression Statement Goals reassessed today. Pt on 2L nasal cannula throughout session. Her SPO2 ranged from 89-99% with testing and exercise. Pt has met FOTO goal (54%) indicating improvement in mobility and QOL. Pt making gains toward remaining goals but still with impaired gait speed (0.76 m/s), functional capacity (192 ft ambulation prior to rest break) and decreased B LE strength and power (see MMT; 5xSTS 14.3 seconds). Pt still has drop in SPO2 with ambulation for 192 ft to 89%, where SPO2 increased to 95% within one minute of standing break. PT added 5xSTS goal to track pt LE power. During MMT of L ankle dorsiflexion pt complained of pain of L 4th and 5th toes after cavitation with MMT. She reported 5-6/10 pain in 4th and 5th toes following MMT. PT removed pt L shoe and sock to examine area. No bruising, cuts, abrasions, redness present, skin temperature WNL. By end of session pt rated L 4th and 5th toe pain 3/10 with sitting but said it increases with weightbearing. PT instructed pt to monitor symptoms. Patient's condition has the potential to improve in response to therapy. Maximum improvement is yet to be obtained. The anticipated improvement is attainable and reasonable in a generally predictable time. Pt will benefit from further skilled therapy to improve B LE strength and power, gait, and functional capacity to improve ease with ADLs and QOL.    Personal Factors and Comorbidities Age;Comorbidity 3+;Finances;Fitness;Past/Current Experience;Transportation    Comorbidities HFpEF, hypothyroidism, anemia, arthritis, breast cancer, CHF, CKD, DM, Dyslipidemia, HTN, gout, hypothyroidism, morbid obesity, a  fib, PE, thyroid goiter.    Examination-Activity Limitations Bathing;Bed Mobility;Bend;Caring for Others;Carry;Dressing;Locomotion Level;Lift;Squat;Stairs;Stand;Toileting;Transfers    Examination-Participation Restrictions Church;Cleaning;Community  Activity;Driving;Interpersonal Relationship;Meal Prep;Laundry;Shop;Volunteer;Yard Work    [employer] Evolving/Moderate complexity    Rehab Potential Fair    PT Frequency 2x / week    PT Duration 8 weeks    PT Treatment/Interventions Patient/family education;Therapeutic activities;Therapeutic exercise;Functional mobility training;Balance training;Neuromuscular re-education;ADLs/Self Care Home Management;Aquatic Therapy;Canalith Repostioning;Cryotherapy;Electrical Stimulation;Iontophoresis 8m/ml Dexamethasone;Moist Heat;Traction;Ultrasound;DME Instruction;Gait training;Stair training;Orthotic Fit/Training;Manual techniques;Dry needling;Taping;Vestibular;Visual/perceptual remediation/compensation    PT Next Visit Plan Review BORG scale with walking/exercises and progress strengthening as appropriate; progress functional capacity/endurance    PT Home Exercise Plan Issued Modified BORG RPE scale for patient to utilize with wakling/exercises in the home.    Consulted and Agree with Plan of Care Patient           Patient will benefit from skilled therapeutic intervention in order to improve the following deficits and impairments:  Abnormal gait,Decreased balance,Decreased endurance,Decreased mobility,Difficulty walking,Obesity,Decreased strength,Cardiopulmonary status limiting activity,Decreased activity tolerance,Decreased safety awareness,Impaired flexibility,Impaired perceived functional ability,Impaired sensation,Postural dysfunction,Improper body mechanics  Visit Diagnosis: Muscle weakness (generalized)  Other abnormalities of gait and mobility     Problem List Patient Active Problem List   Diagnosis Date Noted  . Physical deconditioning 03/16/2020  . Abnormal findings on diagnostic imaging of lung 03/16/2020  . AF (paroxysmal atrial fibrillation) ([place]   . Acquired thrombophilia ([place]   . Acute on chronic respiratory failure with hypoxia ([place] 02/21/2020  .  Acute on chronic respiratory failure with hypoxemia ([place] 02/21/2020  . Atrial fibrillation, chronic ([place] 02/09/2020  . Dyslipidemia 02/09/2020  . Diabetes mellitus type 2 in obese ([place] 02/09/2020  . Acquired hypothyroidism 02/09/2020  . Thrush, oral 02/09/2020  . Morbid obesity with BMI of 60.0-69.9, adult ([place] 02/03/2020  . Chronic anticoagulation 12/30/2019  . OSA on CPAP 08/04/2019  . Atrial fibrillation with RVR ([place] 10/18/2018  . Acute hypoxemic respiratory failure ([place] 03/29/2018  . Acute on chronic diastolic CHF (congestive heart failure) ([place] 02/04/2018  . Lymphedema 02/04/2018  . Essential hypertension 12/09/2017  . Long term (current) use of aromatase inhibitors 11/17/2017  . Elevated alkaline phosphatase level 05/20/2017  . Goals of care, counseling/discussion 05/14/2017  . Chronic renal disease, stage III ([place] 07/08/2015  . Iron deficiency anemia 07/08/2015  . Acute renal failure superimposed on stage 3 chronic kidney disease ([place] 10/22/2014  . History of breast cancer 10/22/2014  . Elevated troponin 10/22/2014  . Pulmonary emboli ([place] 10/18/2014  . History of pulmonary embolus (PE) 10/18/2014  . Candidiasis of breast 07/06/2014  . Malignant neoplasm of right female breast ([place] 07/24/2012   HRicard DillonPT, DPT 04/01/2020, 7:13 PM  [place]MAIN [facility]SERV[facility] [address] [address]Phone: [phone]  Fax:  [phone] Name: AANDORA KRULLMRN: [id number] of Birth: [birth year]

## 2020-04-05 ENCOUNTER — Other Ambulatory Visit: Payer: Self-pay

## 2020-04-05 ENCOUNTER — Ambulatory Visit: Payer: BC Managed Care – PPO

## 2020-04-05 DIAGNOSIS — M6281 Muscle weakness (generalized): Secondary | ICD-10-CM

## 2020-04-05 DIAGNOSIS — R2689 Other abnormalities of gait and mobility: Secondary | ICD-10-CM | POA: Diagnosis not present

## 2020-04-05 NOTE — Therapy (Signed)
[place] MAIN [facility] SERVICES [address] [place], [place], [address] Phone: [phone]   Fax:  [phone]  Physical Therapy Treatment  Patient Details  Name: STANISLAWA GAFFIN MRN: [id number] Date of Birth: 1965-11-03 Referring Provider (PT): [place], [place]   Encounter Date: 04/05/2020   PT End of Session - 04/06/20 0852    Visit Number 11    Number of Visits 16    Date for PT Re-Evaluation 04/21/20    Authorization Type 1/10 PN 2/17    PT Start Time 1430    PT Stop Time 1514    PT Time Calculation (min) 44 min    Equipment Utilized During Treatment Gait belt;Oxygen   2 L via nasal cannula   Activity Tolerance Patient limited by fatigue   Sp02 limitations   Behavior During Therapy [facility] for tasks assessed/performed           Past Medical History:  Diagnosis Date  . (HFpEF) heart failure with preserved ejection fraction ([place])    a. 10/2018 Echo: EF 60-65%, diast dysfxn, RVSP 50.7 mmHg, mildly dil LA.  Marland Kitchen Acquired hypothyroidism 02/09/2020  . Anemia   . Arthritis   . Breast cancer ([place]) 2014   right breast cancer  . Breast cancer of upper-inner quadrant of right female breast ([place])    Right breast invasive CA and DCIS , 7 mm T1,N0,M0. Er/PR pos, her 2 negative.  Margins 1 mm.  . CHF (congestive heart failure) ([place])   . CKD (chronic kidney disease), stage III ([place])   . Diabetes mellitus type 2 in obese ([place]) 02/09/2020  . Diabetes mellitus without complication ([place])   . Dyslipidemia 02/09/2020  . Essential hypertension   . Gout   . Hypothyroidism   . Menopause    age 55  . Morbid obesity ([place])   . Persistent atrial fibrillation ([place])    a. Dx 10/2018 in setting of PNA. CHA2DS2VASc = 4-->Eliquis; b. 11/2018 s/p successful DCCV.  Marland Kitchen Personal history of radiation therapy   . Pulmonary embolism ([place]) 10/2014   a. Chronic eliquis.  . Thyroid goiter     Past Surgical History:  Procedure Laterality Date  . BREAST BIOPSY Right 2014    breast ca  . BREAST EXCISIONAL BIOPSY Right 07/19/2012   breast ca  . BREAST SURGERY Right 2014   with sentinel node bx subareaolar duct excision  . CARDIOVERSION N/A 11/28/2018   Procedure: CARDIOVERSION;  Surgeon: Minna Merritts, MD;  Location: [facility] ORS;  Service: Cardiovascular;  Laterality: N/A;  . COLONOSCOPY WITH PROPOFOL N/A 01/30/2017   Procedure: COLONOSCOPY WITH PROPOFOL;  Surgeon: Christene Lye, MD;  Location: [facility] ENDOSCOPY;  Service: Endoscopy;  Laterality: N/A;    There were no vitals filed for this visit.   Subjective Assessment - 04/06/20 0848    Subjective Patient reports no falls or LOB since last session. Has been compliant with HEP.    Pertinent History Patient presents to physical therapy for weakness. Has been to ED twice since Christmas.Was discharged from hospital on 02/23/20.  Patient presents with 2 L of 02 via nasal cannula which is new to her. Is monitoring her Sp02 at home and noticing it is low. PMH includes HFpEF, hypothyroidism, anemia, arthritis, breast cancer, CHF, CKD, DM, Dyslipidemia, HTN, gout, hypothyroidism, morbid obesity, a fib, PE, thyroid goiter.    Limitations Lifting;Standing;Walking;House hold activities;Other (comment)    How long can you sit comfortably? no problem    How long can you  stand comfortably? several minutes    How long can you walk comfortably? not sure. gets winded quickly.    Currently in Pain? No/denies              Therex:  weighted ball sit to stand 12x (2000 gr)  Standing with UE support, # 2.5 ankle weight:  Standing Hip Flex; 15x each LE Standing Hip Abd; 10x each LE Standing hip ext; 10x each LE Standing calf raises; 10x slight foot pain.    Patient performed above with VCs and visual demonstration for above exercises.     Seated    Seated hip march 2.5# 10x each LE  Seated knee ext 2.5# 10x each LE  alternating IR/ER 10x each LE 10 reps each leg with VC for technique. Patient demo no  difficulty with exercises today and maintained O2 sat > 95% with all seated exercises.   Neuro re-ed: Standing weight shift kicking soccer ball to PT with CGA, x  5 minutes for coordination, spatial awareness and sequencing.  airex pad: static stand, horizontal head turns 10x, verticla head turns 10x, eyes closed 30 seconds  step over orange hurdle 10x each side, Spo2>96%  Static stand chest press weighted ball 15x      Patient's vitals monitored throughout session with rest breaks to return to therapeutic range.     Pt educated throughout session about proper posture and technique with exercises. Improved exercise technique, movement at target joints, use of target muscles after min to mod verbal, visual, tactile cues   Patient's physician Marlyne Beards has agreed that patient is now able to titer down her oxygen levels as long as Sp02 remains above 90%. Patient's SP02 remained>95% will all interventions this session on 2 L of 02. Patient progressing with prolonged standing interventions. Patient will continue to benefit from skilled physical therapy to improve generalized strength, endurance, and capacity for functional activity               PT Education - 04/06/20 0850    Education Details exercise technique, body mechanics    Person(s) Educated Patient    Methods Explanation;Demonstration;Tactile cues;Verbal cues    Comprehension Verbalized understanding;Returned demonstration;Verbal cues required;Tactile cues required            PT Short Term Goals - 04/01/20 1435      PT SHORT TERM GOAL #1   Title Patient will be independent in home exercise program to improve strength/mobility for better functional independence with ADLs.    Baseline 1/12: HEP given; 2/17 Pt has no questions regarding HEP, states she "tries to do them a couple times a day."    Time 2    Period Weeks    Status Achieved    Target Date 03/10/20             PT Long Term Goals - 04/01/20 1446       PT LONG TERM GOAL #1   Title Patient will increase FOTO score to equal to or greater than   54%  to demonstrate statistically significant improvement in mobility and quality of life.    Baseline 1/12: 45%; 04/01/2020 54%    Time 8    Period Weeks    Status Achieved      PT LONG TERM GOAL #2   Title Patient will increase 10 meter walk test to >1.57ms as to improve gait speed for better community ambulation and to reduce fall risk while maintaining SP02>90%    Baseline 1/12: 0.7 m/s;  2/17 0.76    Time 8    Period Weeks    Status On-going    Target Date 04/21/20      PT LONG TERM GOAL #3   Title Patient will increase BLE gross strength to 4+/5 as to improve functional strength for independent gait, increased standing tolerance and increased ADL ability.    Baseline 1/12: see note 2/17 MMT of LEs grossly 4+/5 with exception of hip flexors 4/5 B    Time 8    Period Weeks    Status Partially Met    Target Date 04/21/20      PT LONG TERM GOAL #4   Title Patient will ambulate >200 ft without an AD with Sp02>90% for increased functional mobility.    Baseline 1/12: 68 ft wtih Sp02 drop; 2/17 192 ft before requring standing rest break whree SPO2 was 89% took one minute to increase to 95%    Time 8    Period Weeks    Status Partially Met    Target Date 04/21/20      PT LONG TERM GOAL #5   Title Patient (< 26 years old) will complete five times sit to stand test in < 10 seconds indicating an increased LE strength and improved balance.    Baseline 04/01/2020 5xSTS completed 14.3 seconds.    Time 4    Period Weeks    Status New    Target Date 04/29/20                 Plan - 04/06/20 0854    Clinical Impression Statement Patient's physician Marlyne Beards has agreed that patient is now able to titer down her oxygen levels as long as Sp02 remains above 90%. Patient's SP02 remained>95% will all interventions this session on 2 L of 02. Patient progressing with prolonged standing  interventions. Patient will continue to benefit from skilled physical therapy to improve generalized strength, endurance, and capacity for functional activity    Personal Factors and Comorbidities Age;Comorbidity 3+;Finances;Fitness;Past/Current Experience;Transportation    Comorbidities HFpEF, hypothyroidism, anemia, arthritis, breast cancer, CHF, CKD, DM, Dyslipidemia, HTN, gout, hypothyroidism, morbid obesity, a fib, PE, thyroid goiter.    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    Rehab Potential Fair    PT Frequency 2x / week    PT Duration 8 weeks    PT Treatment/Interventions Patient/family education;Therapeutic activities;Therapeutic exercise;Functional mobility training;Balance training;Neuromuscular re-education;ADLs/Self Care Home Management;Aquatic Therapy;Canalith Repostioning;Cryotherapy;Electrical Stimulation;Iontophoresis 60m/ml Dexamethasone;Moist Heat;Traction;Ultrasound;DME Instruction;Gait training;Stair training;Orthotic Fit/Training;Manual techniques;Dry needling;Taping;Vestibular;Visual/perceptual remediation/compensation    PT Next Visit Plan Review BORG scale with walking/exercises and progress strengthening as appropriate; progress functional capacity/endurance    PT Home Exercise Plan Issued Modified BORG RPE scale for patient to utilize with wakling/exercises in the home.    Consulted and Agree with Plan of Care Patient           Patient will benefit from skilled therapeutic intervention in order to improve the following deficits and impairments:  Abnormal gait,Decreased balance,Decreased endurance,Decreased mobility,Difficulty  walking,Obesity,Decreased strength,Cardiopulmonary status limiting activity,Decreased activity tolerance,Decreased safety awareness,Impaired flexibility,Impaired perceived functional ability,Impaired sensation,Postural dysfunction,Improper body mechanics  Visit Diagnosis: Muscle weakness (generalized)  Other abnormalities of gait and mobility     Problem List Patient Active Problem List   Diagnosis Date Noted  . Physical deconditioning 03/16/2020  . Abnormal findings on diagnostic imaging of lung 03/16/2020  . AF (paroxysmal atrial fibrillation) ([place]   .  Acquired thrombophilia ([place])   . Acute on chronic respiratory failure with hypoxia ([place]) 02/21/2020  . Acute on chronic respiratory failure with hypoxemia ([place]) 02/21/2020  . Atrial fibrillation, chronic ([place]) 02/09/2020  . Dyslipidemia 02/09/2020  . Diabetes mellitus type 2 in obese ([place]) 02/09/2020  . Acquired hypothyroidism 02/09/2020  . Thrush, oral 02/09/2020  . Morbid obesity with BMI of 60.0-69.9, adult ([place]) 02/03/2020  . Chronic anticoagulation 12/30/2019  . OSA on CPAP 08/04/2019  . Atrial fibrillation with RVR ([place]) 10/18/2018  . Acute hypoxemic respiratory failure ([place]) 03/29/2018  . Acute on chronic diastolic CHF (congestive heart failure) ([place]) 02/04/2018  . Lymphedema 02/04/2018  . Essential hypertension 12/09/2017  . Long term (current) use of aromatase inhibitors 11/17/2017  . Elevated alkaline phosphatase level 05/20/2017  . Goals of care, counseling/discussion 05/14/2017  . Chronic renal disease, stage III ([place]) 07/08/2015  . Iron deficiency anemia 07/08/2015  . Acute renal failure superimposed on stage 3 chronic kidney disease ([place]) 10/22/2014  . History of breast cancer 10/22/2014  . Elevated troponin 10/22/2014  . Pulmonary emboli ([place]) 10/18/2014  . History of pulmonary embolus ([place]) 10/18/2014  . Candidiasis of breast 07/06/2014  . Malignant neoplasm of right female breast ([place]) 07/24/2012   Janna Arch, PT, DPT   04/06/2020, 8:56 AM  [place] MAIN [facility] SERVICES [address] [place], [place], [address] Phone: [phone]   Fax:  [phone]  Name: KEISHA AMER MRN: [id number] Date of Birth: 04-30-1965

## 2020-04-08 ENCOUNTER — Ambulatory Visit: Payer: BC Managed Care – PPO

## 2020-04-08 ENCOUNTER — Other Ambulatory Visit: Payer: Self-pay

## 2020-04-08 DIAGNOSIS — R2689 Other abnormalities of gait and mobility: Secondary | ICD-10-CM | POA: Diagnosis not present

## 2020-04-08 DIAGNOSIS — M6281 Muscle weakness (generalized): Secondary | ICD-10-CM | POA: Diagnosis not present

## 2020-04-08 NOTE — Therapy (Signed)
[place] MAIN [facility] SERVICES [address] [place], [place], [address] Phone: [phone]   Fax:  [phone]  Physical Therapy Treatment  Patient Details  Name: Nicole Schmidt MRN: [id number] Date of Birth: April 17, 1965 Referring Provider (PT): [place], [place]   Encounter Date: 04/08/2020   PT End of Session - 04/08/20 1401    Visit Number 12    Number of Visits 16    Date for PT Re-Evaluation 04/21/20    Authorization Type 2/10 PN 2/17    PT Start Time 1345    PT Stop Time 1429    PT Time Calculation (min) 44 min    Equipment Utilized During Treatment Gait belt;Oxygen   2 L via nasal cannula   Activity Tolerance Patient limited by fatigue   Sp02 limitations   Behavior During Therapy [facility] for tasks assessed/performed           Past Medical History:  Diagnosis Date  . (HFpEF) heart failure with preserved ejection fraction ([place])    a. 10/2018 Echo: EF 60-65%, diast dysfxn, RVSP 50.7 mmHg, mildly dil LA.  Marland Kitchen Acquired hypothyroidism 02/09/2020  . Anemia   . Arthritis   . Breast cancer ([place]) 2014   right breast cancer  . Breast cancer of upper-inner quadrant of right female breast ([place])    Right breast invasive CA and DCIS , 7 mm T1,N0,M0. Er/PR pos, her 2 negative.  Margins 1 mm.  . CHF (congestive heart failure) ([place])   . CKD (chronic kidney disease), stage III ([place])   . Diabetes mellitus type 2 in obese ([place]) 02/09/2020  . Diabetes mellitus without complication ([place])   . Dyslipidemia 02/09/2020  . Essential hypertension   . Gout   . Hypothyroidism   . Menopause    age 55  . Morbid obesity ([place])   . Persistent atrial fibrillation ([place])    a. Dx 10/2018 in setting of PNA. CHA2DS2VASc = 4-->Eliquis; b. 11/2018 s/p successful DCCV.  Marland Kitchen Personal history of radiation therapy   . Pulmonary embolism ([place]) 10/2014   a. Chronic eliquis.  . Thyroid goiter     Past Surgical History:  Procedure Laterality Date  . BREAST BIOPSY Right 2014    breast ca  . BREAST EXCISIONAL BIOPSY Right 07/19/2012   breast ca  . BREAST SURGERY Right 2014   with sentinel node bx subareaolar duct excision  . CARDIOVERSION N/A 11/28/2018   Procedure: CARDIOVERSION;  Surgeon: Minna Merritts, MD;  Location: [facility] ORS;  Service: Cardiovascular;  Laterality: N/A;  . COLONOSCOPY WITH PROPOFOL N/A 01/30/2017   Procedure: COLONOSCOPY WITH PROPOFOL;  Surgeon: Christene Lye, MD;  Location: [facility] ENDOSCOPY;  Service: Endoscopy;  Laterality: N/A;    There were no vitals filed for this visit.   Subjective Assessment - 04/08/20 1350    Subjective Patient reports no falls since last session has been compliant with HEP. Reports it is "not nice" outside today.    Pertinent History Patient presents to physical therapy for weakness. Has been to ED twice since Christmas.Was discharged from hospital on 02/23/20.  Patient presents with 2 L of 02 via nasal cannula which is new to her. Is monitoring her Sp02 at home and noticing it is low. PMH includes HFpEF, hypothyroidism, anemia, arthritis, breast cancer, CHF, CKD, DM, Dyslipidemia, HTN, gout, hypothyroidism, morbid obesity, a fib, PE, thyroid goiter.    Limitations Lifting;Standing;Walking;House hold activities;Other (comment)    How long can you sit comfortably? no problem  How long can you stand comfortably? several minutes    How long can you walk comfortably? not sure. gets winded quickly.    Currently in Pain? No/denies             Vitals at start of session: [id number]%, pulse 60 Bp: 147/72  TherEx: close CGA with cues for body mechanics and sequencing for optimal muscle recruitment.  Ambulate in hallway without AD with close CGA,  First attempt with 2L 02 via nasal cannula: ambulate 96 ft with SP02 ranging to 94%.  Second attempt with 1.5 L 02 via nasal initially in seated started to 91% Sp02 that then increased to 99% while walking and returned to 92% in seated by end of ambulation of 96 ft.    Standing next to support surface: 1.5 L 02 via nasal cannula:   #2.5 ankle weight: ; 2 sets each LE  -marching, cues for sequencing, UE support, slow controlled muscle control 12x each LE; Sp02 95%; second set 96%  -hip abduction 12x each LE, cues for upright posture and muscle activation Sp02 96%; second set 98%  -hip extension, 12x each LE, alternating LE's. Cues for upright posture gluteal squeeze, second set 97%   6" step:  -step up/down BUE support; 10x each LE, one LE at a time. Sp02 drop to 92% between LE's that then raised to 95%. ; after performing on RLE drop to 88% but quickly returned to >92%.   Neuro Re-ed: close CGA and cues for stabilization and body mechanics   airex pad: noticeable ankle righting reactions with trembling of limbs.  -narrow BOS 30 seconds ; SP02 98%  -head turns 10x to L and R, narrow base of support, one near LOB  -vertical head movements 10x up and down, narrow BOS.  -weighted ball (2000 gr) toss with static stance 12x, with narrow BOS 12x very fatiguing with narrow BOS   Patient's vitals monitored throughout session with rest breaks to return to therapeutic range.  Pt educated throughout session about proper posture and technique with exercises. Improved exercise technique, movement at target joints, use of target muscles after min to mod verbal, visual, tactile cues   Patient educated on monitoring SP02 on 1.5 L in home environment.   Patient is highly motivated throughout physical therapy session. She tolerates decreasing oxygen levels from 2 to 1.5 L O2 via nasal cannula while maintaining Sp02>90% indicating progressive aerobic capacity and decreased reliance on external oxygen. Oxygen and other vitals monitored throughout session ensuring therapeutic values. Patient will continue to benefit from skilled physical therapy to improve generalized strength, endurance, and capacity for functional activity                   PT Education  - 04/08/20 1350    Education Details exercise technique, decreasing oxygen saturation with ambulation    Person(s) Educated Patient    Methods Explanation;Demonstration;Tactile cues;Verbal cues    Comprehension Verbalized understanding;Returned demonstration;Verbal cues required;Tactile cues required            PT Short Term Goals - 04/01/20 1435      PT SHORT TERM GOAL #1   Title Patient will be independent in home exercise program to improve strength/mobility for better functional independence with ADLs.    Baseline 1/12: HEP given; 2/17 Pt has no questions regarding HEP, states she "tries to do them a couple times a day."    Time 2    Period Weeks    Status Achieved  Target Date 03/10/20             PT Long Term Goals - 04/01/20 1446      PT LONG TERM GOAL #1   Title Patient will increase FOTO score to equal to or greater than   54%  to demonstrate statistically significant improvement in mobility and quality of life.    Baseline 1/12: 45%; 04/01/2020 54%    Time 8    Period Weeks    Status Achieved      PT LONG TERM GOAL #2   Title Patient will increase 10 meter walk test to >1.12ms as to improve gait speed for better community ambulation and to reduce fall risk while maintaining SP02>90%    Baseline 1/12: 0.7 m/s; 2/17 0.76    Time 8    Period Weeks    Status On-going    Target Date 04/21/20      PT LONG TERM GOAL #3   Title Patient will increase BLE gross strength to 4+/5 as to improve functional strength for independent gait, increased standing tolerance and increased ADL ability.    Baseline 1/12: see note 2/17 MMT of LEs grossly 4+/5 with exception of hip flexors 4/5 B    Time 8    Period Weeks    Status Partially Met    Target Date 04/21/20      PT LONG TERM GOAL #4   Title Patient will ambulate >200 ft without an AD with Sp02>90% for increased functional mobility.    Baseline 1/12: 68 ft wtih Sp02 drop; 2/17 192 ft before requring standing rest break  whree SPO2 was 89% took one minute to increase to 95%    Time 8    Period Weeks    Status Partially Met    Target Date 04/21/20      PT LONG TERM GOAL #5   Title Patient (< 661years old) will complete five times sit to stand test in < 10 seconds indicating an increased LE strength and improved balance.    Baseline 04/01/2020 5xSTS completed 14.3 seconds.    Time 4    Period Weeks    Status New    Target Date 04/29/20                 Plan - 04/08/20 1405    Clinical Impression Statement Patient is highly motivated throughout physical therapy session. She tolerates decreasing oxygen levels from 2 to 1.5 L O2 via nasal cannula while maintaining Sp02>90% indicating progressive aerobic capacity and decreased reliance on external oxygen. Oxygen and other vitals monitored throughout session ensuring therapeutic values. Patient will continue to benefit from skilled physical therapy to improve generalized strength, endurance, and capacity for functional activity    Personal Factors and Comorbidities Age;Comorbidity 3+;Finances;Fitness;Past/Current Experience;Transportation    Comorbidities HFpEF, hypothyroidism, anemia, arthritis, breast cancer, CHF, CKD, DM, Dyslipidemia, HTN, gout, hypothyroidism, morbid obesity, a fib, PE, thyroid goiter.    Examination-Activity Limitations Bathing;Bed Mobility;Bend;Caring for Others;Carry;Dressing;Locomotion Level;Lift;Squat;Stairs;Stand;Toileting;Transfers    Examination-Participation Restrictions Church;Cleaning;Community Activity;Driving;Interpersonal Relationship;Meal Prep;Laundry;Shop;Volunteer;Yard Work    SMerchant navy officerEvolving/Moderate complexity    Rehab Potential Fair    PT Frequency 2x / week    PT Duration 8 weeks    PT Treatment/Interventions Patient/family education;Therapeutic activities;Therapeutic exercise;Functional mobility training;Balance training;Neuromuscular re-education;ADLs/Self Care Home Management;Aquatic  Therapy;Canalith Repostioning;Cryotherapy;Electrical Stimulation;Iontophoresis 443mml Dexamethasone;Moist Heat;Traction;Ultrasound;DME Instruction;Gait training;Stair training;Orthotic Fit/Training;Manual techniques;Dry needling;Taping;Vestibular;Visual/perceptual remediation/compensation    PT Next Visit Plan Review BORG scale with walking/exercises and progress strengthening as appropriate; progress functional  capacity/endurancel contrue decreasing Sp02 levels    PT Home Exercise Plan Issued Modified BORG RPE scale for patient to utilize with wakling/exercises in the home.    Consulted and Agree with Plan of Care Patient           Patient will benefit from skilled therapeutic intervention in order to improve the following deficits and impairments:  Abnormal gait,Decreased balance,Decreased endurance,Decreased mobility,Difficulty walking,Obesity,Decreased strength,Cardiopulmonary status limiting activity,Decreased activity tolerance,Decreased safety awareness,Impaired flexibility,Impaired perceived functional ability,Impaired sensation,Postural dysfunction,Improper body mechanics  Visit Diagnosis: Muscle weakness (generalized)  Other abnormalities of gait and mobility     Problem List Patient Active Problem List   Diagnosis Date Noted  . Physical deconditioning 03/16/2020  . Abnormal findings on diagnostic imaging of lung 03/16/2020  . AF (paroxysmal atrial fibrillation) ([place])   . Acquired thrombophilia ([place])   . Acute on chronic respiratory failure with hypoxia ([place]) 02/21/2020  . Acute on chronic respiratory failure with hypoxemia ([place]) 02/21/2020  . Atrial fibrillation, chronic ([place]) 02/09/2020  . Dyslipidemia 02/09/2020  . Diabetes mellitus type 2 in obese ([place]) 02/09/2020  . Acquired hypothyroidism 02/09/2020  . Thrush, oral 02/09/2020  . Morbid obesity with BMI of 60.0-69.9, adult ([place]) 02/03/2020  . Chronic anticoagulation 12/30/2019  . OSA on CPAP 08/04/2019  . Atrial  fibrillation with RVR ([place]) 10/18/2018  . Acute hypoxemic respiratory failure ([place]) 03/29/2018  . Acute on chronic diastolic CHF (congestive heart failure) ([place]) 02/04/2018  . Lymphedema 02/04/2018  . Essential hypertension 12/09/2017  . Long term (current) use of aromatase inhibitors 11/17/2017  . Elevated alkaline phosphatase level 05/20/2017  . Goals of care, counseling/discussion 05/14/2017  . Chronic renal disease, stage III ([place]) 07/08/2015  . Iron deficiency anemia 07/08/2015  . Acute renal failure superimposed on stage 3 chronic kidney disease ([place]) 10/22/2014  . History of breast cancer 10/22/2014  . Elevated troponin 10/22/2014  . Pulmonary emboli ([place]) 10/18/2014  . History of pulmonary embolus (PE) 10/18/2014  . Candidiasis of breast 07/06/2014  . Malignant neoplasm of right female breast ([place]) 07/24/2012   Janna Arch, PT, DPT   04/08/2020, 2:35 PM  [place] MAIN [facility] SERVICES [address] [place], [place], [address] Phone: [phone]   Fax:  [phone]  Name: Nicole Schmidt MRN: [id number] Date of Birth: May 15, 1965

## 2020-04-13 ENCOUNTER — Ambulatory Visit: Payer: BC Managed Care – PPO | Attending: Endocrinology

## 2020-04-13 DIAGNOSIS — M6281 Muscle weakness (generalized): Secondary | ICD-10-CM | POA: Diagnosis not present

## 2020-04-13 DIAGNOSIS — R2689 Other abnormalities of gait and mobility: Secondary | ICD-10-CM | POA: Insufficient documentation

## 2020-04-13 NOTE — Therapy (Signed)
[place] MAIN [facility] SERVICES [address] [place], [place], [address] Phone: [phone]   Fax:  [phone]  Physical Therapy Treatment  Patient Details  Name: Nicole Schmidt MRN: [id number] Date of Birth: 1965-12-31 Referring Provider (PT): morayati, [place]   Encounter Date: 04/13/2020   PT End of Session - 04/13/20 1529    Visit Number 13    Number of Visits 16    Date for PT Re-Evaluation 04/21/20    Authorization Type 3/10 PN 2/17    PT Start Time 1426    PT Stop Time 1521    PT Time Calculation (min) 55 min    Equipment Utilized During Treatment Gait belt;Oxygen   2 L via nasal cannula   Activity Tolerance Patient limited by fatigue   Sp02 limitations   Behavior During Therapy [facility] for tasks assessed/performed           Past Medical History:  Diagnosis Date  . (HFpEF) heart failure with preserved ejection fraction ([place])    a. 10/2018 Echo: EF 60-65%, diast dysfxn, RVSP 50.7 mmHg, mildly dil LA.  Marland Kitchen Acquired hypothyroidism 02/09/2020  . Anemia   . Arthritis   . Breast cancer ([place]) 2014   right breast cancer  . Breast cancer of upper-inner quadrant of right female breast ([place])    Right breast invasive CA and DCIS , 7 mm T1,N0,M0. Er/PR pos, her 2 negative.  Margins 1 mm.  . CHF (congestive heart failure) ([place])   . CKD (chronic kidney disease), stage III ([place])   . Diabetes mellitus type 2 in obese ([place]) 02/09/2020  . Diabetes mellitus without complication ([place])   . Dyslipidemia 02/09/2020  . Essential hypertension   . Gout   . Hypothyroidism   . Menopause    age 55  . Morbid obesity ([place])   . Persistent atrial fibrillation ([place])    a. Dx 10/2018 in setting of PNA. CHA2DS2VASc = 4-->Eliquis; b. 11/2018 s/p successful DCCV.  Marland Kitchen Personal history of radiation therapy   . Pulmonary embolism ([place]) 10/2014   a. Chronic eliquis.  . Thyroid goiter     Past Surgical History:  Procedure Laterality Date  . BREAST BIOPSY Right 2014    breast ca  . BREAST EXCISIONAL BIOPSY Right 07/19/2012   breast ca  . BREAST SURGERY Right 2014   with sentinel node bx subareaolar duct excision  . CARDIOVERSION N/A 11/28/2018   Procedure: CARDIOVERSION;  Surgeon: Minna Merritts, MD;  Location: [facility] ORS;  Service: Cardiovascular;  Laterality: N/A;  . COLONOSCOPY WITH PROPOFOL N/A 01/30/2017   Procedure: COLONOSCOPY WITH PROPOFOL;  Surgeon: Christene Lye, MD;  Location: [facility] ENDOSCOPY;  Service: Endoscopy;  Laterality: N/A;    There were no vitals filed for this visit.   Subjective Assessment - 04/13/20 1431    Subjective Pt reports no falls. Has had her SPO2 > 90% at home since titrating O2 via [place] at 1.5 L. No soreness from previous session.    Pertinent History Patient presents to physical therapy for weakness. Has been to ED twice since Christmas.Was discharged from hospital on 57/10/22.  Patient presents with 2 L of 02 via nasal cannula which is new to her. Is monitoring her Sp02 at home and noticing it is low. PMH includes HFpEF, hypothyroidism, anemia, arthritis, breast cancer, CHF, CKD, DM, Dyslipidemia, HTN, gout, hypothyroidism, morbid obesity, a fib, PE, thyroid goiter.    Limitations Lifting;Standing;Walking;House hold activities;Other (comment)    How long can you  sit comfortably? no problem    How long can you stand comfortably? several minutes    How long can you walk comfortably? not sure. gets winded quickly.    Currently in Pain? No/denies           Vitals Prior to treatment:   BP:145/118  mm Hg. Seated 2 min break to reassess BP due to elevated diastolic number. Second reading at 137/ 62 mm Hg.   HR: 65 BPM  SPO2: 96% on 2 L  Titrated O2 to 1 L in sitting. SPO2 at 98%.   There.ex:  Amb in hallway with O2 on 1 L. CGA+1 to improve pt's tolerance to walking while decreasing O2 reliance. 65' x3 with seated rest between bouts. SPO2 at 88-89% when seated after exercise. When seated SPO2 elevated to 92-97%  after 30 sec. Education provided on pursed lip breathing to improve O2 sats.    5/10 Borg RPE  Alternating 4" side steps with BUE support for Le strength and cardiopulm endurance. 2x8 reps. Standing break b/t sets. O2 at 88% after second bout requiring seated rest. After 30 sec O2 returned to 98%.   6/10 RPE  Standing marches in // bars with 2.5 AW's: 2x10. Min VC for upright posture and controlled movements during eccentric portion for targeted muscle groups. Good carryover after cuing.   3/10 RPE   Standing hip abd with 2.5 lbs AW's: 2x12. Min VC for upright posture for targeted muscle groups. Good carryover after cuing.    3/10 RPE Alternating 4" step ups for cardiopulmonary and muscular endurance.: 2x45 sec. Standing rest b/t bouts. CGA+1  HR: 95 BPM, SPO2: 92%, RPE: 5/10    Pt educated on maintaining 1 L of O2 via [place] due to maintained O2 Levels > 90 % with aerobic and strength exercises. Pt educated to raise O2 back to 1.5 L if SPO2 declines below 90% with pt verbalizing understanding.     PT Education - 04/13/20 1528    Education Details Maintaining O2 levels via [place] > 90% as PT titrates O2 lower. Exercise technique.    Person(s) Educated Patient    Methods Explanation;Demonstration;Verbal cues    Comprehension Verbalized understanding;Returned demonstration            PT Short Term Goals - 04/01/20 1435      PT SHORT TERM GOAL #1   Title Patient will be independent in home exercise program to improve strength/mobility for better functional independence with ADLs.    Baseline 1/12: HEP given; 2/17 Pt has no questions regarding HEP, states she "tries to do them a couple times a day."    Time 2    Period Weeks    Status Achieved    Target Date 03/10/20             PT Long Term Goals - 04/01/20 1446      PT LONG TERM GOAL #1   Title Patient will increase FOTO score to equal to or greater than   54%  to demonstrate statistically significant improvement in mobility and  quality of life.    Baseline 1/12: 45%; 04/01/2020 54%    Time 8    Period Weeks    Status Achieved      PT LONG TERM GOAL #2   Title Patient will increase 10 meter walk test to >1.66m/s as to improve gait speed for better community ambulation and to reduce fall risk while maintaining SP02>90%    Baseline 1/12: 0.7 m/s; 2/17 0.76  Time 8    Period Weeks    Status On-going    Target Date 04/21/20      PT LONG TERM GOAL #3   Title Patient will increase BLE gross strength to 4+/5 as to improve functional strength for independent gait, increased standing tolerance and increased ADL ability.    Baseline 1/12: see note 2/17 MMT of LEs grossly 4+/5 with exception of hip flexors 4/5 B    Time 8    Period Weeks    Status Partially Met    Target Date 04/21/20      PT LONG TERM GOAL #4   Title Patient will ambulate >200 ft without an AD with Sp02>90% for increased functional mobility.    Baseline 1/12: 68 ft wtih Sp02 drop; 2/17 192 ft before requring standing rest break whree SPO2 was 89% took one minute to increase to 95%    Time 8    Period Weeks    Status Partially Met    Target Date 04/21/20      PT LONG TERM GOAL #5   Title Patient (< 91 years old) will complete five times sit to stand test in < 10 seconds indicating an increased LE strength and improved balance.    Baseline 04/01/2020 5xSTS completed 14.3 seconds.    Time 4    Period Weeks    Status New    Target Date 04/29/20                 Plan - 04/13/20 1530    Clinical Impression Statement Pt continues to maintain high motivation levels in PT. PT continues to titrate O2 levels this session from 1.5 L to 1.0 L per MD permission. Initially with ambulation and cardiopulmonary exercise O2 dropped to 88-89% but with standing or seated rest and pursed lip breathing returned to > 92% after 20-30 sec rest. At end of session with aerobic and LE strength exercises, Pt's O2 level remained > 90% immediately after exercise. Pt  educated to monitor O2 sats at 1 L at home regularly with household distances and if O2 drops below 90%, increase O2 back to 1.5 L. Pt verbalized understanding. Pt can continue to benefit from skilled PT treatment to improve cardiopulmonary endurance and return to PLOF.    Personal Factors and Comorbidities Age;Comorbidity 3+;Finances;Fitness;Past/Current Experience;Transportation    Comorbidities HFpEF, hypothyroidism, anemia, arthritis, breast cancer, CHF, CKD, DM, Dyslipidemia, HTN, gout, hypothyroidism, morbid obesity, a fib, PE, thyroid goiter.    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    Rehab Potential Fair    PT Frequency 2x / week    PT Duration 8 weeks    PT Treatment/Interventions Patient/family education;Therapeutic activities;Therapeutic exercise;Functional mobility training;Balance training;Neuromuscular re-education;ADLs/Self Care Home Management;Aquatic Therapy;Canalith Repostioning;Cryotherapy;Electrical Stimulation;Iontophoresis 4mg /ml Dexamethasone;Moist Heat;Traction;Ultrasound;DME Instruction;Gait training;Stair training;Orthotic Fit/Training;Manual techniques;Dry needling;Taping;Vestibular;Visual/perceptual remediation/compensation    PT Next Visit Plan Progress functional capacity with titrating O2 as tolerated. Upright tolerance and LE exercise.    PT Home Exercise Plan Issued Modified BORG RPE scale for patient to utilize with wakling/exercises in the home.    Consulted and Agree with Plan of Care Patient           Patient will benefit from skilled therapeutic intervention in order to improve the following deficits and impairments:  Abnormal  gait,Decreased balance,Decreased endurance,Decreased mobility,Difficulty walking,Obesity,Decreased strength,Cardiopulmonary status limiting activity,Decreased activity tolerance,Decreased safety awareness,Impaired flexibility,Impaired perceived functional ability,Impaired sensation,Postural  dysfunction,Improper body mechanics  Visit Diagnosis: Muscle weakness (generalized)  Other abnormalities of gait and mobility     Problem List Patient Active Problem List   Diagnosis Date Noted  . Physical deconditioning 03/16/2020  . Abnormal findings on diagnostic imaging of lung 03/16/2020  . AF (paroxysmal atrial fibrillation) ([place])   . Acquired thrombophilia ([place])   . Acute on chronic respiratory failure with hypoxia ([place]) 02/21/2020  . Acute on chronic respiratory failure with hypoxemia ([place]) 02/21/2020  . Atrial fibrillation, chronic ([place]) 02/09/2020  . Dyslipidemia 02/09/2020  . Diabetes mellitus type 2 in obese ([place]) 02/09/2020  . Acquired hypothyroidism 02/09/2020  . Thrush, oral 02/09/2020  . Morbid obesity with BMI of 60.0-69.9, adult ([place]) 02/03/2020  . Chronic anticoagulation 12/30/2019  . OSA on CPAP 08/04/2019  . Atrial fibrillation with RVR ([place]) 10/18/2018  . Acute hypoxemic respiratory failure ([place]) 03/29/2018  . Acute on chronic diastolic CHF (congestive heart failure) ([place]) 02/04/2018  . Lymphedema 02/04/2018  . Essential hypertension 12/09/2017  . Long term (current) use of aromatase inhibitors 11/17/2017  . Elevated alkaline phosphatase level 05/20/2017  . Goals of care, counseling/discussion 05/14/2017  . Chronic renal disease, stage III ([place]) 07/08/2015  . Iron deficiency anemia 07/08/2015  . Acute renal failure superimposed on stage 3 chronic kidney disease ([place]) 10/22/2014  . History of breast cancer 10/22/2014  . Elevated troponin 10/22/2014  . Pulmonary emboli ([place]) 10/18/2014  . History of pulmonary embolus (PE) 10/18/2014  . Candidiasis of breast 07/06/2014   . Malignant neoplasm of right female breast ([place]) 07/24/2012    Salem Caster. Fairly IV, PT, DPT Physical Therapist- [facility]  04/13/2020, 3:44 PM  [place] MAIN [facility] SERVICES [address] [place], [place], [address] Phone: [phone]   Fax:  [phone]  Name: Nicole Schmidt MRN: [id number] Date of Birth: November 11, 1965

## 2020-04-15 ENCOUNTER — Ambulatory Visit: Payer: BC Managed Care – PPO

## 2020-04-15 ENCOUNTER — Other Ambulatory Visit: Payer: Self-pay

## 2020-04-15 DIAGNOSIS — M6281 Muscle weakness (generalized): Secondary | ICD-10-CM | POA: Diagnosis not present

## 2020-04-15 DIAGNOSIS — R2689 Other abnormalities of gait and mobility: Secondary | ICD-10-CM

## 2020-04-15 NOTE — Therapy (Signed)
[place] MAIN [facility] SERVICES [address] [place], [place], [address] Phone: [phone]   Fax:  [phone]  Physical Therapy Treatment  Patient Details  Name: Nicole Schmidt MRN: [id number] Date of Birth: Jul 08, 1965 Referring Provider (PT): morayati, [place]   Encounter Date: 04/15/2020   PT End of Session - 04/15/20 1652    Visit Number 14    Number of Visits 16    Date for PT Re-Evaluation 04/21/20    Authorization Type 4/10 PN 2/17    PT Start Time 1517    PT Stop Time 1601    PT Time Calculation (min) 44 min    Equipment Utilized During Treatment Gait belt;Oxygen   2 L via nasal cannula   Activity Tolerance Patient limited by fatigue   Sp02 limitations   Behavior During Therapy [facility] for tasks assessed/performed           Past Medical History:  Diagnosis Date  . (HFpEF) heart failure with preserved ejection fraction ([place])    a. 10/2018 Echo: EF 60-65%, diast dysfxn, RVSP 50.7 mmHg, mildly dil LA.  Marland Kitchen Acquired hypothyroidism 02/09/2020  . Anemia   . Arthritis   . Breast cancer ([place]) 2014   right breast cancer  . Breast cancer of upper-inner quadrant of right female breast ([place])    Right breast invasive CA and DCIS , 7 mm T1,N0,M0. Er/PR pos, her 2 negative.  Margins 1 mm.  . CHF (congestive heart failure) ([place])   . CKD (chronic kidney disease), stage III ([place])   . Diabetes mellitus type 2 in obese ([place]) 02/09/2020  . Diabetes mellitus without complication ([place])   . Dyslipidemia 02/09/2020  . Essential hypertension   . Gout   . Hypothyroidism   . Menopause    age 45  . Morbid obesity ([place])   . Persistent atrial fibrillation ([place])    a. Dx 10/2018 in setting of PNA. CHA2DS2VASc = 4-->Eliquis; b. 11/2018 s/p successful DCCV.  Marland Kitchen Personal history of radiation therapy   . Pulmonary embolism ([place]) 10/2014   a. Chronic eliquis.  . Thyroid goiter     Past Surgical History:  Procedure Laterality Date  . BREAST BIOPSY Right 2014    breast ca  . BREAST EXCISIONAL BIOPSY Right 07/19/2012   breast ca  . BREAST SURGERY Right 2014   with sentinel node bx subareaolar duct excision  . CARDIOVERSION N/A 11/28/2018   Procedure: CARDIOVERSION;  Surgeon: Minna Merritts, MD;  Location: [facility] ORS;  Service: Cardiovascular;  Laterality: N/A;  . COLONOSCOPY WITH PROPOFOL N/A 01/30/2017   Procedure: COLONOSCOPY WITH PROPOFOL;  Surgeon: Christene Lye, MD;  Location: [facility] ENDOSCOPY;  Service: Endoscopy;  Laterality: N/A;    There were no vitals filed for this visit.   Subjective Assessment - 04/15/20 1522    Subjective Pt reports no falls. Has had her SPO2 > 90% at home since titrating O2 via [place] at 1.0 L. But has hovered around 90-92%. No soreness from previous session.    Pertinent History Patient presents to physical therapy for weakness. Has been to ED twice since Christmas.Was discharged from hospital on 02/23/20.  Patient presents with 2 L of 02 via nasal cannula which is new to her. Is monitoring her Sp02 at home and noticing it is low. PMH includes HFpEF, hypothyroidism, anemia, arthritis, breast cancer, CHF, CKD, DM, Dyslipidemia, HTN, gout, hypothyroidism, morbid obesity, a fib, PE, thyroid goiter.    Limitations Lifting;Standing;Walking;House hold activities;Other (comment)  How long can you sit comfortably? no problem    How long can you stand comfortably? several minutes    How long can you walk comfortably? not sure. gets winded quickly.    Currently in Pain? No/denies    Multiple Pain Sites No            Vitals Prior to treatment:              BP: 159/67  mm Hg.              HR: 75 BPM             SPO2: 1.0 L 90%, HR: 81-85 BPM.   Therex:   Amb 2 bouts at 132' with O2 remaining on 1 L via [place]. Seated rest b/t bouts. During each seated rest vitals remained at 90% SPO2 and HR: 81-85 BPM. SPO2 elevated to 98% after 45 sec to 1 min and HR to 67 BPM.   0-10 RPE borg: 6/10   STS with 2.2 lbs med ball 2x12  reps. Education provided on pursed lip breathing. First bout: HR: 77 BPM, SPO2: 94%. Second bout: HR: 83 BPM, SPO2: 93%  0-10 RPE borg: 5/10  Alternating foot taps on 6" step with bilat finger touch for support: 4# AW's, x3 for 1 min.   1: SPO2: 94%, HR: 85 BPM;   2: SPO2: 94% , HR: 88 BPM  3: SPO2: 93% , HR: 91 BPM  7/10 RPE due to LE fatigue, not SOB or difficulty breathing. Pt maintained standing rest breaks to begin further improving standing and exercise tolerance.    Education provided on importance of pursed lip breathing. Min VC and TC's for form/tehcnique with exercises with good carryover after cuing.    PT Education - 04/15/20 1555    Education Details Pursed lip breathing, exercise technique.    Person(s) Educated Patient    Methods Explanation;Demonstration;Tactile cues;Verbal cues    Comprehension Verbalized understanding;Returned demonstration            PT Short Term Goals - 04/01/20 1435      PT SHORT TERM GOAL #1   Title Patient will be independent in home exercise program to improve strength/mobility for better functional independence with ADLs.    Baseline 1/12: HEP given; 2/17 Pt has no questions regarding HEP, states she "tries to do them a couple times a day."    Time 2    Period Weeks    Status Achieved    Target Date 03/10/20             PT Long Term Goals - 04/01/20 1446      PT LONG TERM GOAL #1   Title Patient will increase FOTO score to equal to or greater than   54%  to demonstrate statistically significant improvement in mobility and quality of life.    Baseline 1/12: 45%; 04/01/2020 54%    Time 8    Period Weeks    Status Achieved      PT LONG TERM GOAL #2   Title Patient will increase 10 meter walk test to >1.79ms as to improve gait speed for better community ambulation and to reduce fall risk while maintaining SP02>90%    Baseline 1/12: 0.7 m/s; 2/17 0.76    Time 8    Period Weeks    Status On-going    Target Date 04/21/20      PT  LONG TERM GOAL #3   Title Patient will increase BLE  gross strength to 4+/5 as to improve functional strength for independent gait, increased standing tolerance and increased ADL ability.    Baseline 1/12: see note 2/17 MMT of LEs grossly 4+/5 with exception of hip flexors 4/5 B    Time 8    Period Weeks    Status Partially Met    Target Date 04/21/20      PT LONG TERM GOAL #4   Title Patient will ambulate >200 ft without an AD with Sp02>90% for increased functional mobility.    Baseline 1/12: 68 ft wtih Sp02 drop; 2/17 192 ft before requring standing rest break whree SPO2 was 89% took one minute to increase to 95%    Time 8    Period Weeks    Status Partially Met    Target Date 04/21/20      PT LONG TERM GOAL #5   Title Patient (< 95 years old) will complete five times sit to stand test in < 10 seconds indicating an increased LE strength and improved balance.    Baseline 04/01/2020 5xSTS completed 14.3 seconds.    Time 4    Period Weeks    Status New    Target Date 04/29/20                 Plan - 04/15/20 1652    Clinical Impression Statement Pt progressing in therapy. Pt maintaining on 1 L due to O2 staying at 90-92% with ambulatory tasks. Pt progressed to maintaining standing rests b/t sets of exercises to improve standing and exercise tolerance. Pt re-educated on importance of pursed lip breathing to improve capacity for functional mobility. Pt can continue to benefit from skilled PT treatment to improve functional mobility.    Personal Factors and Comorbidities Age;Comorbidity 3+;Finances;Fitness;Past/Current Experience;Transportation    Comorbidities HFpEF, hypothyroidism, anemia, arthritis, breast cancer, CHF, CKD, DM, Dyslipidemia, HTN, gout, hypothyroidism, morbid obesity, a fib, PE, thyroid goiter.    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    Rehab Potential Fair    PT Frequency 2x / week    PT Duration 8 weeks    PT Treatment/Interventions Patient/family education;Therapeutic activities;Therapeutic exercise;Functional mobility training;Balance training;Neuromuscular re-education;ADLs/Self Care Home Management;Aquatic Therapy;Canalith Repostioning;Cryotherapy;Electrical Stimulation;Iontophoresis 49m/ml Dexamethasone;Moist Heat;Traction;Ultrasound;DME Instruction;Gait training;Stair training;Orthotic Fit/Training;Manual techniques;Dry needling;Taping;Vestibular;Visual/perceptual remediation/compensation    PT Next Visit Plan Progress functional capacity with titrating O2 as tolerated. Upright tolerance and LE exercise.    PT Home Exercise Plan Issued Modified BORG RPE scale for patient to utilize with wakling/exercises in the home.    Consulted and Agree with Plan of Care Patient           Patient will benefit from skilled therapeutic intervention in order to improve the following deficits and impairments:  Abnormal gait,Decreased balance,Decreased endurance,Decreased mobility,Difficulty walking,Obesity,Decreased strength,Cardiopulmonary status limiting activity,Decreased activity tolerance,Decreased safety awareness,Impaired flexibility,Impaired perceived functional ability,Impaired sensation,Postural dysfunction,Improper body mechanics  Visit Diagnosis: Muscle weakness (generalized)  Other abnormalities of gait and mobility     Problem List Patient Active Problem List   Diagnosis Date Noted  . Physical deconditioning 03/16/2020  . Abnormal findings on diagnostic imaging of lung 03/16/2020  . AF (paroxysmal atrial fibrillation) ([place]   . Acquired thrombophilia ([place]   . Acute on chronic respiratory failure  with hypoxia ([place] 02/21/2020  . Acute on chronic respiratory failure with hypoxemia ([place] 02/21/2020  . Atrial fibrillation, chronic ([place] 02/09/2020  . Dyslipidemia 02/09/2020  .  Diabetes mellitus type 2 in obese ([place]) 02/09/2020  . Acquired hypothyroidism 02/09/2020  . Thrush, oral 02/09/2020  . Morbid obesity with BMI of 60.0-69.9, adult ([place]) 02/03/2020  . Chronic anticoagulation 12/30/2019  . OSA on CPAP 08/04/2019  . Atrial fibrillation with RVR ([place]) 10/18/2018  . Acute hypoxemic respiratory failure ([place]) 03/29/2018  . Acute on chronic diastolic CHF (congestive heart failure) ([place]) 02/04/2018  . Lymphedema 02/04/2018  . Essential hypertension 12/09/2017  . Long term (current) use of aromatase inhibitors 11/17/2017  . Elevated alkaline phosphatase level 05/20/2017  . Goals of care, counseling/discussion 05/14/2017  . Chronic renal disease, stage III ([place]) 07/08/2015  . Iron deficiency anemia 07/08/2015  . Acute renal failure superimposed on stage 3 chronic kidney disease ([place]) 10/22/2014  . History of breast cancer 10/22/2014  . Elevated troponin 10/22/2014  . Pulmonary emboli ([place]) 10/18/2014  . History of pulmonary embolus (PE) 10/18/2014  . Candidiasis of breast 07/06/2014  . Malignant neoplasm of right female breast ([place]) 07/24/2012    Salem Caster. Fairly IV, PT, DPT Physical Therapist- [facility]  04/15/2020, 5:00 PM  [place] MAIN [facility] SERVICES [address] [place], [place], [address] Phone: [phone]   Fax:  [phone]  Name: Nicole Schmidt MRN: [id number] Date of Birth: 11/14/1965

## 2020-04-17 DIAGNOSIS — I5032 Chronic diastolic (congestive) heart failure: Secondary | ICD-10-CM | POA: Diagnosis not present

## 2020-04-17 DIAGNOSIS — J9601 Acute respiratory failure with hypoxia: Secondary | ICD-10-CM | POA: Diagnosis not present

## 2020-04-17 DIAGNOSIS — G4733 Obstructive sleep apnea (adult) (pediatric): Secondary | ICD-10-CM | POA: Diagnosis not present

## 2020-04-17 DIAGNOSIS — I482 Chronic atrial fibrillation, unspecified: Secondary | ICD-10-CM | POA: Diagnosis not present

## 2020-04-21 ENCOUNTER — Ambulatory Visit: Payer: BC Managed Care – PPO

## 2020-04-21 ENCOUNTER — Other Ambulatory Visit: Payer: Self-pay

## 2020-04-21 DIAGNOSIS — M6281 Muscle weakness (generalized): Secondary | ICD-10-CM | POA: Diagnosis not present

## 2020-04-21 DIAGNOSIS — R2689 Other abnormalities of gait and mobility: Secondary | ICD-10-CM | POA: Diagnosis not present

## 2020-04-21 NOTE — Therapy (Signed)
[place] MAIN [facility] SERVICES [address] [place], [place], [address] Phone: [phone]   Fax:  [phone]  Physical Therapy Treatment/RECERTIFICATION  Patient Details  Name: Nicole Schmidt MRN: [id number] Date of Birth: Dec 04, 1965 Referring Provider (PT): morayati, [place]   Encounter Date: 04/21/2020   PT End of Session - 04/21/20 1727    Visit Number 15    Number of Visits 21    Date for PT Re-Evaluation 05/26/20    Authorization Type 4/10 PN 2/17    PT Start Time 4497    PT Stop Time 1231    PT Time Calculation (min) 46 min    Equipment Utilized During Treatment Gait belt;Oxygen   2 L via nasal cannula   Activity Tolerance Patient limited by fatigue   Sp02 limitations   Behavior During Therapy [facility] for tasks assessed/performed           Past Medical History:  Diagnosis Date  . (HFpEF) heart failure with preserved ejection fraction ([place])    a. 10/2018 Echo: EF 60-65%, diast dysfxn, RVSP 50.7 mmHg, mildly dil LA.  Marland Kitchen Acquired hypothyroidism 02/09/2020  . Anemia   . Arthritis   . Breast cancer ([place]) 2014   right breast cancer  . Breast cancer of upper-inner quadrant of right female breast ([place])    Right breast invasive CA and DCIS , 7 mm T1,N0,M0. Er/PR pos, her 2 negative.  Margins 1 mm.  . CHF (congestive heart failure) ([place])   . CKD (chronic kidney disease), stage III ([place])   . Diabetes mellitus type 2 in obese ([place]) 02/09/2020  . Diabetes mellitus without complication ([place])   . Dyslipidemia 02/09/2020  . Essential hypertension   . Gout   . Hypothyroidism   . Menopause    age 55  . Morbid obesity ([place])   . Persistent atrial fibrillation ([place])    a. Dx 10/2018 in setting of PNA. CHA2DS2VASc = 4-->Eliquis; b. 11/2018 s/p successful DCCV.  Marland Kitchen Personal history of radiation therapy   . Pulmonary embolism ([place]) 10/2014   a. Chronic eliquis.  . Thyroid goiter     Past Surgical History:  Procedure Laterality Date  . BREAST  BIOPSY Right 2014   breast ca  . BREAST EXCISIONAL BIOPSY Right 07/19/2012   breast ca  . BREAST SURGERY Right 2014   with sentinel node bx subareaolar duct excision  . CARDIOVERSION N/A 11/28/2018   Procedure: CARDIOVERSION;  Surgeon: Minna Merritts, MD;  Location: [facility] ORS;  Service: Cardiovascular;  Laterality: N/A;  . COLONOSCOPY WITH PROPOFOL N/A 01/30/2017   Procedure: COLONOSCOPY WITH PROPOFOL;  Surgeon: Christene Lye, MD;  Location: [facility] ENDOSCOPY;  Service: Endoscopy;  Laterality: N/A;    There were no vitals filed for this visit.   Subjective Assessment - 04/21/20 1726    Subjective Pt denies falls and pain currently. She presents today on 1.0 L O2 via [place].    Pertinent History Patient presents to physical therapy for weakness. Has been to ED twice since Christmas.Was discharged from hospital on 02/23/20.  Patient presents with 2 L of 02 via nasal cannula which is new to her. Is monitoring her Sp02 at home and noticing it is low. PMH includes HFpEF, hypothyroidism, anemia, arthritis, breast cancer, CHF, CKD, DM, Dyslipidemia, HTN, gout, hypothyroidism, morbid obesity, a fib, PE, thyroid goiter.    Limitations Lifting;Standing;Walking;House hold activities;Other (comment)    How long can you sit comfortably? no problem    How long can  you stand comfortably? several minutes    How long can you walk comfortably? not sure. gets winded quickly.    Currently in Pain? No/denies          Treatment:   Session focused on reassessment of goals  FOTO: 50/100  10MWT: 0.79 m/s, no AD on 1.0 L O2 [place]  MMT: LEs tested and grossly 4+/5  Amb. with oxygen tank for 200 ft total. Pt standing rest break at 132 ft where O2 dropped below 90% (to 87%) and took one minute to increase above 90%. Afterward pt ambulated remaining feet.  5xSTS 13.27 seconds  Agility walking on 1L [place] Baseline: SPO2% 95, HR 61 Trial 1: 12 sec Trial 2: 14 sec Trial 3: 13 sec  Education provided  throughout regarding technique/instructions with each test and assessment, as well as with interventions. VC/TC/demonstration was utilized with min. Verbal cues to facilitate proper body mechanics with all exercise.   PT Education - 04/21/20 1726    Education Details Reassessment findings, POC, indications for therapy    Person(s) Educated Patient    Methods Explanation    Comprehension Verbalized understanding            PT Short Term Goals - 04/21/20 1218      PT SHORT TERM GOAL #1   Title Patient will be independent in home exercise program to improve strength/mobility for better functional independence with ADLs.    Baseline 1/12: HEP given; 2/17 Pt has no questions regarding HEP, states she "tries to do them a couple times a day."    Time 2    Period Weeks    Status Achieved    Target Date 03/10/20             PT Long Term Goals - 04/21/20 1152      PT LONG TERM GOAL #1   Title Patient will increase FOTO score to equal to or greater than   54%  to demonstrate statistically significant improvement in mobility and quality of life.    Baseline 1/12: 45%; 04/01/2020 54%    Time 8    Period Weeks    Status Achieved      PT LONG TERM GOAL #2   Title Patient will increase 10 meter walk test to >1.54ms as to improve gait speed for better community ambulation and to reduce fall risk while maintaining SP02>90%    Baseline 1/12: 0.7 m/s; 2/17 0.76; 04/21/2020 0.79 SPO2 97% on 1L [place]    Time 5    Period Weeks    Status On-going    Target Date 05/26/20      PT LONG TERM GOAL #3   Title Patient will increase BLE gross strength to 4+/5 as to improve functional strength for independent gait, increased standing tolerance and increased ADL ability.    Baseline 1/12: see note 2/17 MMT of LEs grossly 4+/5 with exception of hip flexors 4/5 B; 04/21/2020 Pt LEs grossly 4+/5    Time 8    Period Weeks    Status Achieved      PT LONG TERM GOAL #4   Title Patient will ambulate >200 ft  without an AD with Sp02>90% for increased functional mobility.    Baseline 1/12: 68 ft wtih Sp02 drop; 2/17 192 ft before requring standing rest break whree SPO2 was 89% took one minute to increase to 95%; 04/21/2020 pt on 1L [place] amb. 132 ft before O2 <90 (87%) one minutes to go >90, and finished ambulating full  200 ft. (pt rates medium)    Time 5    Period Weeks    Status Partially Met    Target Date 05/26/20      PT LONG TERM GOAL #5   Title Patient (< 12 years old) will complete five times sit to stand test in < 10 seconds indicating an increased LE strength and improved balance.    Baseline 04/01/2020 5xSTS completed 14.3 seconds; 04/21/2020 13.27    Time 5    Period Weeks    Status On-going    Target Date 05/26/20                 Plan - 04/21/20 1737    Clinical Impression Statement Pt goals reassessed. Pt met MMT goal, indicating increased BLE strength. Pt increased gait speed slightly to 0.79 m/s. She did see slight decrease in FOTO score to 50/100. Pt also with slight decrease in distance ambulated prior to drop in O2 below 90% (pt O2 dropped to 87% while on 1.0 L [place]). Pt required break at 132 ft where it took approx. 1 minute for her O2 to increase back above 90%, where pt then ambulated remaining distance to 200 ft. Pt highly motivated throughout session. PT educated pt on assessment findings. Patient's condition has the potential to improve in response to therapy. Maximum improvement is yet to be obtained. The anticipated improvement is attainable and reasonable in a generally predictable time. Frequency being decreased to 1x/week to extend pt's remaining visits over a longer period of time in order to maximize gains and safely progress pt in therapy. Pt overall making gains toward goals and would benefit from further skilled therapy to improve functional capacity and endurance.    Personal Factors and Comorbidities Age;Comorbidity 3+;Finances;Fitness;Past/Current  Experience;Transportation    Comorbidities HFpEF, hypothyroidism, anemia, arthritis, breast cancer, CHF, CKD, DM, Dyslipidemia, HTN, gout, hypothyroidism, morbid obesity, a fib, PE, thyroid goiter.    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    Rehab Potential Good    PT Frequency 1x / week    PT Duration Other (comment)   5 weeks   PT Treatment/Interventions Patient/family education;Therapeutic activities;Therapeutic exercise;Functional mobility training;Balance training;Neuromuscular re-education;ADLs/Self Care Home Management;Aquatic Therapy;Canalith Repostioning;Cryotherapy;Electrical Stimulation;Iontophoresis 42m/ml Dexamethasone;Moist Heat;Traction;Ultrasound;DME Instruction;Gait training;Stair training;Orthotic Fit/Training;Manual techniques;Dry needling;Taping;Vestibular;Visual/perceptual remediation/compensation    PT Next Visit Plan Progress functional capacity with titrating O2 as tolerated. Upright tolerance and LE exercise; cardiovascular endurance amb. exercises    PT Home Exercise Plan Issued Modified BORG RPE scale for patient to utilize with wakling/exercises in the home.    Consulted and Agree with Plan of Care Patient           Patient will benefit from skilled therapeutic intervention in order to improve the following deficits and impairments:  Abnormal gait,Decreased balance,Decreased endurance,Decreased mobility,Difficulty walking,Obesity,Decreased strength,Cardiopulmonary status limiting activity,Decreased activity tolerance,Decreased safety awareness,Impaired flexibility,Impaired perceived functional ability,Impaired sensation,Postural  dysfunction,Improper body mechanics  Visit Diagnosis: Muscle weakness (generalized)  Other abnormalities of gait and mobility     Problem List Patient Active Problem List   Diagnosis Date Noted  . Physical deconditioning 03/16/2020  . Abnormal findings on diagnostic imaging of lung 03/16/2020  . AF (paroxysmal atrial fibrillation) ([place]   . Acquired thrombophilia ([place]   . Acute on chronic respiratory failure with hypoxia ([place] 02/21/2020  . Acute on chronic respiratory failure with hypoxemia ([place] 02/21/2020  . Atrial fibrillation, chronic ([place]  02/09/2020  . Dyslipidemia 02/09/2020  . Diabetes mellitus type 2 in obese ([place]) 02/09/2020  . Acquired hypothyroidism 02/09/2020  . Thrush, oral 02/09/2020  . Morbid obesity with BMI of 60.0-69.9, adult ([place]) 02/03/2020  . Chronic anticoagulation 12/30/2019  . OSA on CPAP 08/04/2019  . Atrial fibrillation with RVR ([place]) 10/18/2018  . Acute hypoxemic respiratory failure ([place]) 03/29/2018  . Acute on chronic diastolic CHF (congestive heart failure) ([place]) 02/04/2018  . Lymphedema 02/04/2018  . Essential hypertension 12/09/2017  . Long term (current) use of aromatase inhibitors 11/17/2017  . Elevated alkaline phosphatase level 05/20/2017  . Goals of care, counseling/discussion 05/14/2017  . Chronic renal disease, stage III ([place]) 07/08/2015  . Iron deficiency anemia 07/08/2015  . Acute renal failure superimposed on stage 3 chronic kidney disease ([place]) 10/22/2014  . History of breast cancer 10/22/2014  . Elevated troponin 10/22/2014  . Pulmonary emboli ([place]) 10/18/2014  . History of pulmonary embolus (PE) 10/18/2014  . Candidiasis of breast 07/06/2014  . Malignant neoplasm of right female breast ([place]) 07/24/2012   Ricard Dillon PT, DPT 04/22/2020, 8:35 AM  [place] MAIN [facility] SERVICES [address] [place], [place], [address] Phone: [phone]   Fax:  [phone]  Name: ZONDRA LAWLOR MRN:  [id number] Date of Birth: 09-May-1965

## 2020-04-26 DIAGNOSIS — E669 Obesity, unspecified: Secondary | ICD-10-CM | POA: Diagnosis not present

## 2020-04-26 DIAGNOSIS — D352 Benign neoplasm of pituitary gland: Secondary | ICD-10-CM | POA: Diagnosis not present

## 2020-04-26 DIAGNOSIS — C50819 Malignant neoplasm of overlapping sites of unspecified female breast: Secondary | ICD-10-CM | POA: Diagnosis not present

## 2020-04-26 DIAGNOSIS — I2699 Other pulmonary embolism without acute cor pulmonale: Secondary | ICD-10-CM | POA: Diagnosis not present

## 2020-04-26 DIAGNOSIS — I5043 Acute on chronic combined systolic (congestive) and diastolic (congestive) heart failure: Secondary | ICD-10-CM | POA: Diagnosis not present

## 2020-04-26 DIAGNOSIS — E559 Vitamin D deficiency, unspecified: Secondary | ICD-10-CM | POA: Diagnosis not present

## 2020-04-28 ENCOUNTER — Other Ambulatory Visit: Payer: Self-pay

## 2020-04-28 ENCOUNTER — Ambulatory Visit: Payer: BC Managed Care – PPO

## 2020-04-28 DIAGNOSIS — R2689 Other abnormalities of gait and mobility: Secondary | ICD-10-CM | POA: Diagnosis not present

## 2020-04-28 DIAGNOSIS — M6281 Muscle weakness (generalized): Secondary | ICD-10-CM

## 2020-04-28 NOTE — Therapy (Deleted)
[place] MAIN [facility] SERVICES [address] [place], [place], [address] Phone: [phone]   Fax:  [phone]  Physical Therapy Treatment  Patient Details  Name: Nicole Schmidt MRN: [id number] Date of Birth: [birth year] Referring Provider (PT): morayati, [place]   Encounter Date: 04/28/2020    Past Medical History:  Diagnosis Date  . (HFpEF) heart failure with preserved ejection fraction ([place])    a. 10/2018 Echo: EF 60-65%, diast dysfxn, RVSP 50.7 mmHg, mildly dil LA.  Marland Kitchen Acquired hypothyroidism 02/09/2020  . Anemia   . Arthritis   . Breast cancer ([place]) 2014   right breast cancer  . Breast cancer of upper-inner quadrant of right female breast ([place])    Right breast invasive CA and DCIS , 7 mm T1,N0,M0. Er/PR pos, her 2 negative.  Margins 1 mm.  . CHF (congestive heart failure) ([place])   . CKD (chronic kidney disease), stage III ([place])   . Diabetes mellitus type 2 in obese ([place]) 02/09/2020  . Diabetes mellitus without complication ([place])   . Dyslipidemia 02/09/2020  . Essential hypertension   . Gout   . Hypothyroidism   . Menopause    age 33  . Morbid obesity ([place])   . Persistent atrial fibrillation ([place])    a. Dx 10/2018 in setting of PNA. CHA2DS2VASc = 4-->Eliquis; b. 11/2018 s/p successful DCCV.  Marland Kitchen Personal history of radiation therapy   . Pulmonary embolism ([place]) 10/2014   a. Chronic eliquis.  . Thyroid goiter     Past Surgical History:  Procedure Laterality Date  . BREAST BIOPSY Right 2014   breast ca  . BREAST EXCISIONAL BIOPSY Right 07/19/2012   breast ca  . BREAST SURGERY Right 2014   with sentinel node bx subareaolar duct excision  . CARDIOVERSION N/A 11/28/2018   Procedure: CARDIOVERSION;  Surgeon: Minna Merritts, MD;  Location: [facility] ORS;  Service: Cardiovascular;  Laterality: N/A;  . COLONOSCOPY WITH PROPOFOL N/A 01/30/2017   Procedure: COLONOSCOPY WITH PROPOFOL;  Surgeon: Christene Lye, MD;  Location: [facility]  ENDOSCOPY;  Service: Endoscopy;  Laterality: N/A;    There were no vitals filed for this visit.   TREATMENT  Therex:    Amb for endurance, 2 x138 ft, 1x138 ft with O2 remaining on 1 L via [place]; Pt SPO2% ranged from 89-97% with laps. Prolonged seated rest b/t rounds. HR 98-105 bpm throughout.  0-10 RPE borg: 3/10 for all laps  STS with 2.2 lbs med ball 1x12, 1x15 reps.   SPO2: 92-96%, HR 80s throughout; pt required prolonged seated rest break between sets             0-10 RPE borg: 2/10  Seated Alternating foot taps onto 6" step: 1x15 without AW, 4# AW's 2x15 SPO2 97%   Pt educated throughout session about proper posture and technique with exercises. Improved exercise technique, movement at target joints, use of target muscles after min to mod verbal, visual, tactile cues   Assessment: Pt session limited secondary to fatigue today, where pt required multiple prolonged seated rest breaks with exercises. Although pt was fatigued, her Borg RPE rating remained between 2-3/10 throughout entire session. SPO2 % with ambulation ranged from 89-97% with pt on 1 L O2 via . Pt will continue to benefit from further skilled therapy to improve BLE strength and functional capacity/gait ability to improve ease with ADLs      PT Short Term Goals - 04/21/20 1218      PT SHORT TERM GOAL #  1   Title Patient will be independent in home exercise program to improve strength/mobility for better functional independence with ADLs.    Baseline 1/12: HEP given; 2/17 Pt has no questions regarding HEP, states she "tries to do them a couple times a day."    Time 2    Period Weeks    Status Achieved    Target Date 03/10/20             PT Long Term Goals - 04/21/20 1152      PT LONG TERM GOAL #1   Title Patient will increase FOTO score to equal to or greater than   54%  to demonstrate statistically significant improvement in mobility and quality of life.    Baseline 1/12: 45%; 04/01/2020 54%    Time 8     Period Weeks    Status Achieved      PT LONG TERM GOAL #2   Title Patient will increase 10 meter walk test to >1.36ms as to improve gait speed for better community ambulation and to reduce fall risk while maintaining SP02>90%    Baseline 1/12: 0.7 m/s; 2/17 0.76; 04/21/2020 0.79 SPO2 97% on 1L [place]    Time 5    Period Weeks    Status On-going    Target Date 05/26/20      PT LONG TERM GOAL #3   Title Patient will increase BLE gross strength to 4+/5 as to improve functional strength for independent gait, increased standing tolerance and increased ADL ability.    Baseline 1/12: see note 2/17 MMT of LEs grossly 4+/5 with exception of hip flexors 4/5 B; 04/21/2020 Pt LEs grossly 4+/5    Time 8    Period Weeks    Status Achieved      PT LONG TERM GOAL #4   Title Patient will ambulate >200 ft without an AD with Sp02>90% for increased functional mobility.    Baseline 1/12: 68 ft wtih Sp02 drop; 2/17 192 ft before requring standing rest break whree SPO2 was 89% took one minute to increase to 95%; 04/21/2020 pt on 1L [place] amb. 132 ft before O2 <90 (87%) one minutes to go >90, and finished ambulating full 200 ft. (pt rates medium)    Time 5    Period Weeks    Status Partially Met    Target Date 05/26/20      PT LONG TERM GOAL #5   Title Patient (< 62years old) will complete five times sit to stand test in < 10 seconds indicating an increased LE strength and improved balance.    Baseline 04/01/2020 5xSTS completed 14.3 seconds; 04/21/2020 13.27    Time 5    Period Weeks    Status On-going    Target Date 05/26/20                  Patient will benefit from skilled therapeutic intervention in order to improve the following deficits and impairments:     Visit Diagnosis: No diagnosis found.     Problem List Patient Active Problem List   Diagnosis Date Noted  . Physical deconditioning 03/16/2020  . Abnormal findings on diagnostic imaging of lung 03/16/2020  . AF (paroxysmal atrial  fibrillation) ([place]   . Acquired thrombophilia ([place]   . Acute on chronic respiratory failure with hypoxia ([place] 02/21/2020  . Acute on chronic respiratory failure with hypoxemia ([place] 02/21/2020  . Atrial fibrillation, chronic ([place] 02/09/2020  . Dyslipidemia 02/09/2020  . Diabetes mellitus type  2 in obese ([place]) 02/09/2020  . Acquired hypothyroidism 02/09/2020  . Thrush, oral 02/09/2020  . Morbid obesity with BMI of 60.0-69.9, adult ([place]) 02/03/2020  . Chronic anticoagulation 12/30/2019  . OSA on CPAP 08/04/2019  . Atrial fibrillation with RVR ([place]) 10/18/2018  . Acute hypoxemic respiratory failure ([place]) 03/29/2018  . Acute on chronic diastolic CHF (congestive heart failure) ([place]) 02/04/2018  . Lymphedema 02/04/2018  . Essential hypertension 12/09/2017  . Long term (current) use of aromatase inhibitors 11/17/2017  . Elevated alkaline phosphatase level 05/20/2017  . Goals of care, counseling/discussion 05/14/2017  . Chronic renal disease, stage III ([place]) 07/08/2015  . Iron deficiency anemia 07/08/2015  . Acute renal failure superimposed on stage 3 chronic kidney disease ([place]) 10/22/2014  . History of breast cancer 10/22/2014  . Elevated troponin 10/22/2014  . Pulmonary emboli ([place]) 10/18/2014  . History of pulmonary embolus (PE) 10/18/2014  . Candidiasis of breast 07/06/2014  . Malignant neoplasm of right female breast ([place]) 07/24/2012   Ricard Dillon PT, DPT 04/28/2020, 1:00 PM  [place] MAIN [facility] SERVICES [address] [place], [place], [address] Phone: [phone]   Fax:  [phone]  Name: Nicole Schmidt MRN: [id number] Date of Birth: 1965/08/08

## 2020-04-28 NOTE — Therapy (Signed)
[place] MAIN [facility] SERVICES [address] [place], [place], [address] Phone: [phone]   Fax:  [phone]  Physical Therapy Treatment  Patient Details  Name: Nicole Schmidt MRN: [id number] Date of Birth: 03-Mar-1965 Referring Provider (PT): [place], [place]   Encounter Date: 04/28/2020   PT End of Session - 04/28/20 4097    Visit Number 16    Number of Visits 21    Date for PT Re-Evaluation 05/26/20    Authorization Type 4/10 PN 2/17    PT Start Time 3532    PT Stop Time 1344    PT Time Calculation (min) 39 min    Equipment Utilized During Treatment Gait belt;Oxygen   2 L via nasal cannula   Activity Tolerance Patient limited by fatigue;Patient tolerated treatment well   Sp02 limitations   Behavior During Therapy [facility] for tasks assessed/performed           Past Medical History:  Diagnosis Date  . (HFpEF) heart failure with preserved ejection fraction ([place])    a. 10/2018 Echo: EF 60-65%, diast dysfxn, RVSP 50.7 mmHg, mildly dil LA.  Marland Kitchen Acquired hypothyroidism 02/09/2020  . Anemia   . Arthritis   . Breast cancer ([place]) 2014   right breast cancer  . Breast cancer of upper-inner quadrant of right female breast ([place])    Right breast invasive CA and DCIS , 7 mm T1,N0,M0. Er/PR pos, her 2 negative.  Margins 1 mm.  . CHF (congestive heart failure) ([place])   . CKD (chronic kidney disease), stage III ([place])   . Diabetes mellitus type 2 in obese ([place]) 02/09/2020  . Diabetes mellitus without complication ([place])   . Dyslipidemia 02/09/2020  . Essential hypertension   . Gout   . Hypothyroidism   . Menopause    age 89  . Morbid obesity ([place])   . Persistent atrial fibrillation ([place])    a. Dx 10/2018 in setting of PNA. CHA2DS2VASc = 4-->Eliquis; b. 11/2018 s/p successful DCCV.  Marland Kitchen Personal history of radiation therapy   . Pulmonary embolism ([place]) 10/2014   a. Chronic eliquis.  . Thyroid goiter     Past Surgical History:  Procedure Laterality  Date  . BREAST BIOPSY Right 2014   breast ca  . BREAST EXCISIONAL BIOPSY Right 07/19/2012   breast ca  . BREAST SURGERY Right 2014   with sentinel node bx subareaolar duct excision  . CARDIOVERSION N/A 11/28/2018   Procedure: CARDIOVERSION;  Surgeon: Minna Merritts, MD;  Location: [facility] ORS;  Service: Cardiovascular;  Laterality: N/A;  . COLONOSCOPY WITH PROPOFOL N/A 01/30/2017   Procedure: COLONOSCOPY WITH PROPOFOL;  Surgeon: Christene Lye, MD;  Location: [facility] ENDOSCOPY;  Service: Endoscopy;  Laterality: N/A;    There were no vitals filed for this visit.   Subjective Assessment - 04/28/20 1306    Subjective Pt reports no pain today. Pt reports doing HEP.    Pertinent History Patient presents to physical therapy for weakness. Has been to ED twice since Christmas.Was discharged from hospital on 02/23/20.  Patient presents with 2 L of 02 via nasal cannula which is new to her. Is monitoring her Sp02 at home and noticing it is low. PMH includes HFpEF, hypothyroidism, anemia, arthritis, breast cancer, CHF, CKD, DM, Dyslipidemia, HTN, gout, hypothyroidism, morbid obesity, a fib, PE, thyroid goiter.    Limitations Lifting;Standing;Walking;House hold activities;Other (comment)    How long can you sit comfortably? no problem    How long can you stand comfortably?  several minutes    How long can you walk comfortably? not sure. gets winded quickly.    Currently in Pain? No/denies           TREATMENT  Therex:    Amb for endurance, 2 x138 ft, 1x138 ft with O2 remaining on 1 L via [place]; Pt SPO2% ranged from 89-97% with laps. Prolonged seated rest b/t rounds. HR 98-105 bpm throughout.  0-10 RPE borg: 3/10 for all laps  STS with 2.2 lbs med ball 1x12, 1x15 reps.   SPO2: 92-96%, HR 80s throughout; pt required prolonged seated rest break between sets             0-10 RPE borg: 2/10  Seated Alternating foot taps onto 6" step: 1x15 without AW, 4# AW's 2x15 SPO2 97%   Pt educated  throughout session about proper posture and technique with exercises. Improved exercise technique, movement at target joints, use of target muscles after min to mod verbal, visual, tactile cues   Assessment: Pt session limited secondary to fatigue today, where pt required multiple prolonged seated rest breaks with exercises. Although pt was fatigued, her Borg RPE rating remained between 2-3/10 throughout entire session. SPO2 % with ambulation ranged from 89-97% with pt on 1 L O2 via [place]. Pt will continue to benefit from further skilled therapy to improve BLE strength and functional capacity/gait ability to improve ease with ADLs     PT Education - 04/28/20 1650    Education Details Exercise technique, body mechanics    Person(s) Educated Patient    Methods Explanation;Verbal cues;Demonstration    Comprehension Verbalized understanding;Returned demonstration            PT Short Term Goals - 04/21/20 1218      PT SHORT TERM GOAL #1   Title Patient will be independent in home exercise program to improve strength/mobility for better functional independence with ADLs.    Baseline 1/12: HEP given; 2/17 Pt has no questions regarding HEP, states she "tries to do them a couple times a day."    Time 2    Period Weeks    Status Achieved    Target Date 03/10/20             PT Long Term Goals - 04/21/20 1152      PT LONG TERM GOAL #1   Title Patient will increase FOTO score to equal to or greater than   54%  to demonstrate statistically significant improvement in mobility and quality of life.    Baseline 1/12: 45%; 04/01/2020 54%    Time 8    Period Weeks    Status Achieved      PT LONG TERM GOAL #2   Title Patient will increase 10 meter walk test to >1.22ms as to improve gait speed for better community ambulation and to reduce fall risk while maintaining SP02>90%    Baseline 1/12: 0.7 m/s; 2/17 0.76; 04/21/2020 0.79 SPO2 97% on 1L [place]    Time 5    Period Weeks    Status On-going     Target Date 05/26/20      PT LONG TERM GOAL #3   Title Patient will increase BLE gross strength to 4+/5 as to improve functional strength for independent gait, increased standing tolerance and increased ADL ability.    Baseline 1/12: see note 2/17 MMT of LEs grossly 4+/5 with exception of hip flexors 4/5 B; 04/21/2020 Pt LEs grossly 4+/5    Time 8    Period Weeks  Status Achieved      PT LONG TERM GOAL #4   Title Patient will ambulate >200 ft without an AD with Sp02>90% for increased functional mobility.    Baseline 1/12: 68 ft wtih Sp02 drop; 2/17 192 ft before requring standing rest break whree SPO2 was 89% took one minute to increase to 95%; 04/21/2020 pt on 1L [place] amb. 132 ft before O2 <90 (87%) one minutes to go >90, and finished ambulating full 200 ft. (pt rates medium)    Time 5    Period Weeks    Status Partially Met    Target Date 05/26/20      PT LONG TERM GOAL #5   Title Patient (< 42 years old) will complete five times sit to stand test in < 10 seconds indicating an increased LE strength and improved balance.    Baseline 04/01/2020 5xSTS completed 14.3 seconds; 04/21/2020 13.27    Time 5    Period Weeks    Status On-going    Target Date 05/26/20                 Plan - 04/28/20 1657    Clinical Impression Statement Pt session limited secondary to fatigue today, where pt required multiple prolonged seated rest breaks with exercises. Although pt was fatigued, her Borg RPE rating remained between 2-3/10 throughout entire session. SPO2 % with ambulation ranged from 89-97% with pt on 1 L O2 via . Pt will continue to benefit from further skilled therapy to improve BLE strength and functional capacity/gait ability to improve ease with ADLs.    Personal Factors and Comorbidities Age;Comorbidity 3+;Finances;Fitness;Past/Current Experience;Transportation    Comorbidities HFpEF, hypothyroidism, anemia, arthritis, breast cancer, CHF, CKD, DM, Dyslipidemia, HTN, gout, hypothyroidism,  morbid obesity, a fib, PE, thyroid goiter.    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    Rehab Potential Good    PT Frequency 1x / week    PT Duration Other (comment)   5 weeks   PT Treatment/Interventions Patient/family education;Therapeutic activities;Therapeutic exercise;Functional mobility training;Balance training;Neuromuscular re-education;ADLs/Self Care Home Management;Aquatic Therapy;Canalith Repostioning;Cryotherapy;Electrical Stimulation;Iontophoresis 20m/ml Dexamethasone;Moist Heat;Traction;Ultrasound;DME Instruction;Gait training;Stair training;Orthotic Fit/Training;Manual techniques;Dry needling;Taping;Vestibular;Visual/perceptual remediation/compensation    PT Next Visit Plan Progress functional capacity with titrating O2 as tolerated. Upright tolerance and LE exercise; cardiovascular endurance amb. exercises; agility walking    PT Home Exercise Plan Issued Modified BORG RPE scale for patient to utilize with wakling/exercises in the home.    Consulted and Agree with Plan of Care Patient           Patient will benefit from skilled therapeutic intervention in order to improve the following deficits and impairments:  Abnormal gait,Decreased balance,Decreased endurance,Decreased mobility,Difficulty walking,Obesity,Decreased strength,Cardiopulmonary status limiting activity,Decreased activity tolerance,Decreased safety awareness,Impaired flexibility,Impaired perceived functional ability,Impaired sensation,Postural dysfunction,Improper body mechanics  Visit Diagnosis: Other abnormalities of gait and mobility  Muscle weakness (generalized)     Problem  List Patient Active Problem List   Diagnosis Date Noted  . Physical deconditioning 03/16/2020  . Abnormal findings on diagnostic imaging of lung 03/16/2020  . AF (paroxysmal atrial fibrillation) ([place]   . Acquired thrombophilia ([place]   . Acute on chronic respiratory failure with hypoxia ([place] 02/21/2020  . Acute on chronic respiratory failure with hypoxemia ([place] 02/21/2020  . Atrial fibrillation, chronic ([place] 02/09/2020  . Dyslipidemia 02/09/2020  . Diabetes mellitus type 2 in obese ([place] 02/09/2020  . Acquired hypothyroidism 02/09/2020  .  Thrush, oral 02/09/2020  . Morbid obesity with BMI of 60.0-69.9, adult ([place]) 02/03/2020  . Chronic anticoagulation 12/30/2019  . OSA on CPAP 08/04/2019  . Atrial fibrillation with RVR ([place]) 10/18/2018  . Acute hypoxemic respiratory failure ([place]) 03/29/2018  . Acute on chronic diastolic CHF (congestive heart failure) ([place]) 02/04/2018  . Lymphedema 02/04/2018  . Essential hypertension 12/09/2017  . Long term (current) use of aromatase inhibitors 11/17/2017  . Elevated alkaline phosphatase level 05/20/2017  . Goals of care, counseling/discussion 05/14/2017  . Chronic renal disease, stage III ([place]) 07/08/2015  . Iron deficiency anemia 07/08/2015  . Acute renal failure superimposed on stage 3 chronic kidney disease ([place]) 10/22/2014  . History of breast cancer 10/22/2014  . Elevated troponin 10/22/2014  . Pulmonary emboli ([place]) 10/18/2014  . History of pulmonary embolus (PE) 10/18/2014  . Candidiasis of breast 07/06/2014  . Malignant neoplasm of right female breast ([place]) 07/24/2012   Ricard Dillon PT, DPT 04/28/2020, 5:03 PM  [place] MAIN [facility] SERVICES [address] [place], [place], [address] Phone: [phone]   Fax:  [phone]  Name: Nicole Schmidt MRN: [id number] Date of Birth: 08/06/1965

## 2020-05-03 ENCOUNTER — Other Ambulatory Visit: Payer: Self-pay

## 2020-05-03 ENCOUNTER — Ambulatory Visit: Payer: BC Managed Care – PPO

## 2020-05-03 DIAGNOSIS — R2689 Other abnormalities of gait and mobility: Secondary | ICD-10-CM

## 2020-05-03 DIAGNOSIS — M6281 Muscle weakness (generalized): Secondary | ICD-10-CM | POA: Diagnosis not present

## 2020-05-03 NOTE — Therapy (Signed)
[place] MAIN [facility] SERVICES [address] [place], [place], [address] Phone: [phone]   Fax:  [phone]  Physical Therapy Treatment  Patient Details  Name: Nicole Schmidt MRN: [id number] Date of Birth: [birth year] Referring Provider (PT): [place], [place]   Encounter Date: 05/03/2020   PT End of Session - 05/03/20 1652    Visit Number 17    Number of Visits 21    Date for PT Re-Evaluation 05/26/20    Authorization Type 4/10 PN 2/17    PT Start Time 1518    PT Stop Time 1600    PT Time Calculation (min) 42 min    Equipment Utilized During Treatment Gait belt;Oxygen   2 L via nasal cannula   Activity Tolerance Patient limited by fatigue;Patient tolerated treatment well   Sp02 limitations   Behavior During Therapy [facility] for tasks assessed/performed           Past Medical History:  Diagnosis Date  . (HFpEF) heart failure with preserved ejection fraction ([place])    a. 10/2018 Echo: EF 60-65%, diast dysfxn, RVSP 50.7 mmHg, mildly dil LA.  Marland Kitchen Acquired hypothyroidism 02/09/2020  . Anemia   . Arthritis   . Breast cancer ([place]) 2014   right breast cancer  . Breast cancer of upper-inner quadrant of right female breast ([place])    Right breast invasive CA and DCIS , 7 mm T1,N0,M0. Er/PR pos, her 2 negative.  Margins 1 mm.  . CHF (congestive heart failure) ([place])   . CKD (chronic kidney disease), stage III ([place])   . Diabetes mellitus type 2 in obese ([place]) 02/09/2020  . Diabetes mellitus without complication ([place])   . Dyslipidemia 02/09/2020  . Essential hypertension   . Gout   . Hypothyroidism   . Menopause    age 18  . Morbid obesity ([place])   . Persistent atrial fibrillation ([place])    a. Dx 10/2018 in setting of PNA. CHA2DS2VASc = 4-->Eliquis; b. 11/2018 s/p successful DCCV.  Marland Kitchen Personal history of radiation therapy   . Pulmonary embolism ([place]) 10/2014   a. Chronic eliquis.  . Thyroid goiter     Past Surgical History:  Procedure Laterality  Date  . BREAST BIOPSY Right 2014   breast ca  . BREAST EXCISIONAL BIOPSY Right 07/19/2012   breast ca  . BREAST SURGERY Right 2014   with sentinel node bx subareaolar duct excision  . CARDIOVERSION N/A 11/28/2018   Procedure: CARDIOVERSION;  Surgeon: Minna Merritts, MD;  Location: [facility] ORS;  Service: Cardiovascular;  Laterality: N/A;  . COLONOSCOPY WITH PROPOFOL N/A 01/30/2017   Procedure: COLONOSCOPY WITH PROPOFOL;  Surgeon: Christene Lye, MD;  Location: [facility] ENDOSCOPY;  Service: Endoscopy;  Laterality: N/A;    There were no vitals filed for this visit.   Subjective Assessment - 05/03/20 1518    Subjective Pt reports doing HEP a couple times per day. No pain reported.    Pertinent History Patient presents to physical therapy for weakness. Has been to ED twice since Christmas.Was discharged from hospital on 02/23/20.  Patient presents with 2 L of 02 via nasal cannula which is new to her. Is monitoring her Sp02 at home and noticing it is low. PMH includes HFpEF, hypothyroidism, anemia, arthritis, breast cancer, CHF, CKD, DM, Dyslipidemia, HTN, gout, hypothyroidism, morbid obesity, a fib, PE, thyroid goiter.    Limitations Lifting;Standing;Walking;House hold activities;Other (comment)    How long can you sit comfortably? no problem    How long can  you stand comfortably? several minutes    How long can you walk comfortably? not sure. gets winded quickly.    Currently in Pain? No/denies         TREATMENT    Therex:  Baseline SPO2  98%, HR 63, RPE 0/10 1 lap = 157 ft Amb for endurance, CGA provided for all trials with 1L O2 [place] 1x1 Pt SPO2% 87, HR 90 (less than 1 minute to increase to 96%) BORG RPE 3-4/10 1x1 RPE 3-4/10, SPO2 87%-96% within 1 minute, HR 71 1x1 RPE 3-4/10, SPO2 89%-98% within 1 min, HR 79 1x1 RPE 3-4/10 SPO2 95-95% HR 80s, pt with brief standing rest break with focus on breathing technique  Seated rest b/t rounds.     Seated marches with 3# AW 1x20, progress  to use of 5# AW 1x20; VC to decrease compensation with hip ERs.  LAQ 3# AW 2x20; pt rates easy, progressed to 5# AW for 1x20    Pt educated throughout session about proper posture and technique with exercises. Improved exercise technique, movement at target joints, use of target muscles after min to mod verbal cues.      PT Education - 05/03/20 1650    Education Details Educated on progressively increasing time ambulating followed by rest breaks and monitoring HR, SPO2%    Person(s) Educated Patient    Methods Explanation    Comprehension Verbalized understanding            PT Short Term Goals - 04/21/20 1218      PT SHORT TERM GOAL #1   Title Patient will be independent in home exercise program to improve strength/mobility for better functional independence with ADLs.    Baseline 1/12: HEP given; 2/17 Pt has no questions regarding HEP, states she "tries to do them a couple times a day."    Time 2    Period Weeks    Status Achieved    Target Date 03/10/20             PT Long Term Goals - 04/21/20 1152      PT LONG TERM GOAL #1   Title Patient will increase FOTO score to equal to or greater than   54%  to demonstrate statistically significant improvement in mobility and quality of life.    Baseline 1/12: 45%; 04/01/2020 54%    Time 8    Period Weeks    Status Achieved      PT LONG TERM GOAL #2   Title Patient will increase 10 meter walk test to >1.49ms as to improve gait speed for better community ambulation and to reduce fall risk while maintaining SP02>90%    Baseline 1/12: 0.7 m/s; 2/17 0.76; 04/21/2020 0.79 SPO2 97% on 1L [place]    Time 5    Period Weeks    Status On-going    Target Date 05/26/20      PT LONG TERM GOAL #3   Title Patient will increase BLE gross strength to 4+/5 as to improve functional strength for independent gait, increased standing tolerance and increased ADL ability.    Baseline 1/12: see note 2/17 MMT of LEs grossly 4+/5 with exception of hip  flexors 4/5 B; 04/21/2020 Pt LEs grossly 4+/5    Time 8    Period Weeks    Status Achieved      PT LONG TERM GOAL #4   Title Patient will ambulate >200 ft without an AD with Sp02>90% for increased functional mobility.    Baseline  1/12: 68 ft wtih Sp02 drop; 2/17 192 ft before requring standing rest break whree SPO2 was 89% took one minute to increase to 95%; 04/21/2020 pt on 1L [place] amb. 132 ft before O2 <90 (87%) one minutes to go >90, and finished ambulating full 200 ft. (pt rates medium)    Time 5    Period Weeks    Status Partially Met    Target Date 05/26/20      PT LONG TERM GOAL #5   Title Patient (< 51 years old) will complete five times sit to stand test in < 10 seconds indicating an increased LE strength and improved balance.    Baseline 04/01/2020 5xSTS completed 14.3 seconds; 04/21/2020 13.27    Time 5    Period Weeks    Status On-going    Target Date 05/26/20                 Plan - 05/03/20 1657    Clinical Impression Statement Pt maintained Borg RPE of 3-4/10 throughout ambulation exercise and HR ranged from 71-90 bpm. While pt SPO2% did exhibit a decrease to around 87% with majority of laps, pt's SPO2% did increase within 60 seconds to 95% or greater. Although pt still requires frequent rest breaks, she was able to perform seated therex following ambulation without reports of fatigue. Pt will benefit from further skilled therapy to improve BLE strength, functional capacity and gait ability to improve QOL and ease with ADLs.    Personal Factors and Comorbidities Age;Comorbidity 3+;Finances;Fitness;Past/Current Experience;Transportation    Comorbidities HFpEF, hypothyroidism, anemia, arthritis, breast cancer, CHF, CKD, DM, Dyslipidemia, HTN, gout, hypothyroidism, morbid obesity, a fib, PE, thyroid goiter.    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    Rehab Potential Good    PT Frequency 1x / week    PT Duration Other (comment)   5 weeks   PT Treatment/Interventions Patient/family education;Therapeutic activities;Therapeutic exercise;Functional mobility training;Balance training;Neuromuscular re-education;ADLs/Self Care Home Management;Aquatic Therapy;Canalith Repostioning;Cryotherapy;Electrical Stimulation;Iontophoresis 64m/ml Dexamethasone;Moist Heat;Traction;Ultrasound;DME Instruction;Gait training;Stair training;Orthotic Fit/Training;Manual techniques;Dry needling;Taping;Vestibular;Visual/perceptual remediation/compensation    PT Next Visit Plan Progress functional capacity with titrating O2 as tolerated. Upright tolerance and LE exercise; cardiovascular endurance amb. exercises; agility walking; progress seated LE resistance training    PT Home Exercise Plan Issued Modified BORG RPE scale for patient to utilize with wakling/exercises in the home.    Consulted and Agree with Plan of Care Patient           Patient will benefit from skilled therapeutic intervention in order to improve the following deficits and impairments:  Abnormal gait,Decreased balance,Decreased endurance,Decreased mobility,Difficulty walking,Obesity,Decreased strength,Cardiopulmonary status limiting activity,Decreased activity tolerance,Decreased safety awareness,Impaired flexibility,Impaired perceived functional ability,Impaired sensation,Postural dysfunction,Improper body mechanics  Visit Diagnosis: Other abnormalities of gait and mobility  Muscle weakness (generalized)     Problem List Patient Active Problem List   Diagnosis Date Noted  . Physical deconditioning 03/16/2020  . Abnormal findings on diagnostic imaging of lung 03/16/2020  . AF  (paroxysmal atrial fibrillation) ([place]   . Acquired thrombophilia ([place]   . Acute on chronic respiratory failure with hypoxia ([place] 02/21/2020  . Acute on chronic respiratory failure with hypoxemia ([place] 02/21/2020  . Atrial fibrillation, chronic ([place] 02/09/2020  . Dyslipidemia 02/09/2020  . Diabetes mellitus type 2 in obese ([place] 02/09/2020  . Acquired hypothyroidism 02/09/2020  . Thrush, oral 02/09/2020  . Morbid obesity with BMI of 60.0-69.9, adult (  [place]) 02/03/2020  . Chronic anticoagulation 12/30/2019  . OSA on CPAP 08/04/2019  . Atrial fibrillation with RVR ([place]) 10/18/2018  . Acute hypoxemic respiratory failure ([place]) 03/29/2018  . Acute on chronic diastolic CHF (congestive heart failure) ([place]) 02/04/2018  . Lymphedema 02/04/2018  . Essential hypertension 12/09/2017  . Long term (current) use of aromatase inhibitors 11/17/2017  . Elevated alkaline phosphatase level 05/20/2017  . Goals of care, counseling/discussion 05/14/2017  . Chronic renal disease, stage III ([place]) 07/08/2015  . Iron deficiency anemia 07/08/2015  . Acute renal failure superimposed on stage 3 chronic kidney disease ([place]) 10/22/2014  . History of breast cancer 10/22/2014  . Elevated troponin 10/22/2014  . Pulmonary emboli ([place]) 10/18/2014  . History of pulmonary embolus (PE) 10/18/2014  . Candidiasis of breast 07/06/2014  . Malignant neoplasm of right female breast ([place]) 07/24/2012   Ricard Dillon PT, DPT 05/03/2020, 5:03 PM  [place] MAIN [facility] SERVICES [address] [place], [place], [address] Phone: [phone]   Fax:  [phone]  Name: Nicole Schmidt MRN: [id number] Date of Birth: 1965-05-24

## 2020-05-05 ENCOUNTER — Ambulatory Visit: Payer: BC Managed Care – PPO

## 2020-05-06 DIAGNOSIS — L728 Other follicular cysts of the skin and subcutaneous tissue: Secondary | ICD-10-CM | POA: Diagnosis not present

## 2020-05-10 ENCOUNTER — Other Ambulatory Visit: Payer: Self-pay

## 2020-05-10 ENCOUNTER — Ambulatory Visit: Payer: BC Managed Care – PPO

## 2020-05-10 DIAGNOSIS — M6281 Muscle weakness (generalized): Secondary | ICD-10-CM | POA: Diagnosis not present

## 2020-05-10 DIAGNOSIS — R2689 Other abnormalities of gait and mobility: Secondary | ICD-10-CM

## 2020-05-10 NOTE — Therapy (Signed)
[place] MAIN [facility] SERVICES [address] [place], [place], [address] Phone: [phone]   Fax:  [phone]  Physical Therapy Treatment  Patient Details  Name: Nicole Schmidt MRN: [id number] Date of Birth: 06-19-1965 Referring Provider (PT): [place], [place]   Encounter Date: 05/10/2020   PT End of Session - 05/10/20 0258    Visit Number 18    Number of Visits 21    Date for PT Re-Evaluation 05/26/20    Authorization Type 4/10 PN 2/17    PT Start Time 1515    PT Stop Time 1558    PT Time Calculation (min) 43 min    Equipment Utilized During Treatment Gait belt;Oxygen   2 L via nasal cannula   Activity Tolerance Patient limited by fatigue;Patient tolerated treatment well   Sp02 limitations   Behavior During Therapy [facility] for tasks assessed/performed           Past Medical History:  Diagnosis Date  . (HFpEF) heart failure with preserved ejection fraction ([place])    a. 10/2018 Echo: EF 60-65%, diast dysfxn, RVSP 50.7 mmHg, mildly dil LA.  Marland Kitchen Acquired hypothyroidism 02/09/2020  . Anemia   . Arthritis   . Breast cancer ([place]) 2014   right breast cancer  . Breast cancer of upper-inner quadrant of right female breast ([place])    Right breast invasive CA and DCIS , 7 mm T1,N0,M0. Er/PR pos, her 2 negative.  Margins 1 mm.  . CHF (congestive heart failure) ([place])   . CKD (chronic kidney disease), stage III ([place])   . Diabetes mellitus type 2 in obese ([place]) 02/09/2020  . Diabetes mellitus without complication ([place])   . Dyslipidemia 02/09/2020  . Essential hypertension   . Gout   . Hypothyroidism   . Menopause    age 65  . Morbid obesity ([place])   . Persistent atrial fibrillation ([place])    a. Dx 10/2018 in setting of PNA. CHA2DS2VASc = 4-->Eliquis; b. 11/2018 s/p successful DCCV.  Marland Kitchen Personal history of radiation therapy   . Pulmonary embolism ([place]) 10/2014   a. Chronic eliquis.  . Thyroid goiter     Past Surgical History:  Procedure Laterality  Date  . BREAST BIOPSY Right 2014   breast ca  . BREAST EXCISIONAL BIOPSY Right 07/19/2012   breast ca  . BREAST SURGERY Right 2014   with sentinel node bx subareaolar duct excision  . CARDIOVERSION N/A 11/28/2018   Procedure: CARDIOVERSION;  Surgeon: Minna Merritts, MD;  Location: [facility] ORS;  Service: Cardiovascular;  Laterality: N/A;  . COLONOSCOPY WITH PROPOFOL N/A 01/30/2017   Procedure: COLONOSCOPY WITH PROPOFOL;  Surgeon: Christene Lye, MD;  Location: [facility] ENDOSCOPY;  Service: Endoscopy;  Laterality: N/A;    There were no vitals filed for this visit.   Subjective Assessment - 05/10/20 1544    Subjective Pt denies pain. Pt reports doing LE strengthening part of HEP but not walking much at home.    Pertinent History Patient presents to physical therapy for weakness. Has been to ED twice since Christmas.Was discharged from hospital on 02/23/20.  Patient presents with 2 L of 02 via nasal cannula which is new to her. Is monitoring her Sp02 at home and noticing it is low. PMH includes HFpEF, hypothyroidism, anemia, arthritis, breast cancer, CHF, CKD, DM, Dyslipidemia, HTN, gout, hypothyroidism, morbid obesity, a fib, PE, thyroid goiter.    Limitations Lifting;Standing;Walking;House hold activities;Other (comment)    How long can you sit comfortably? no problem  How long can you stand comfortably? several minutes    How long can you walk comfortably? not sure. gets winded quickly.             TREATMENT  Amb for endurance, CGA provided for all trials with 1L O2 [place] Baseline: SPO2 98%, HR 65 bpm, Borg RPE 0/10 1 lap = 157 ft Lap 1: Borg RPE 3/10, SPO2 89%, HR 73 bpm Lap 2: Borg RPE 3-4/10 , SPO2 92%, HR 85 bpm Lap 3: Borge RPE 3-4/10, SPO2 92%, HR 97 bpm Lap 4:  Borg RPE 3/10 SPO2 91-92%, HR 90 bpm; pt with brief standing rest break with focus on breathing technique   Seated rest b/t rounds.     Seated marches 5# AW 1x20; progressed to 7.5# AW 1x20; VC for  technique  LAQ 5# AW for 1x20, progressed to 7.5# AW 1x20  Semi-squats with chair behind pt as tactile cue - 2x10, RPE 2/10, HR 95 bpm; VC/TC for technique  Standing hip abduction - 2x10 each LE, UEs on bar   Standing hip extension - 2x10 each LE, UEs on bar   Pt educated throughout session about proper posture and technique with exercises. Improved exercise technique, movement at target joints, use of target muscles after min to mod verbal cues.       PT Short Term Goals - 04/21/20 1218      PT SHORT TERM GOAL #1   Title Patient will be independent in home exercise program to improve strength/mobility for better functional independence with ADLs.    Baseline 1/12: HEP given; 2/17 Pt has no questions regarding HEP, states she "tries to do them a couple times a day."    Time 2    Period Weeks    Status Achieved    Target Date 03/10/20             PT Long Term Goals - 04/21/20 1152      PT LONG TERM GOAL #1   Title Patient will increase FOTO score to equal to or greater than   54%  to demonstrate statistically significant improvement in mobility and quality of life.    Baseline 1/12: 45%; 04/01/2020 54%    Time 8    Period Weeks    Status Achieved      PT LONG TERM GOAL #2   Title Patient will increase 10 meter walk test to >1.39ms as to improve gait speed for better community ambulation and to reduce fall risk while maintaining SP02>90%    Baseline 1/12: 0.7 m/s; 2/17 0.76; 04/21/2020 0.79 SPO2 97% on 1L [place]    Time 5    Period Weeks    Status On-going    Target Date 05/26/20      PT LONG TERM GOAL #3   Title Patient will increase BLE gross strength to 4+/5 as to improve functional strength for independent gait, increased standing tolerance and increased ADL ability.    Baseline 1/12: see note 2/17 MMT of LEs grossly 4+/5 with exception of hip flexors 4/5 B; 04/21/2020 Pt LEs grossly 4+/5    Time 8    Period Weeks    Status Achieved      PT LONG TERM GOAL #4   Title  Patient will ambulate >200 ft without an AD with Sp02>90% for increased functional mobility.    Baseline 1/12: 68 ft wtih Sp02 drop; 2/17 192 ft before requring standing rest break whree SPO2 was 89% took one minute to increase to  95%; 04/21/2020 pt on 1L [place] amb. 132 ft before O2 <90 (87%) one minutes to go >90, and finished ambulating full 200 ft. (pt rates medium)    Time 5    Period Weeks    Status Partially Met    Target Date 05/26/20      PT LONG TERM GOAL #5   Title Patient (< 77 years old) will complete five times sit to stand test in < 10 seconds indicating an increased LE strength and improved balance.    Baseline 04/01/2020 5xSTS completed 14.3 seconds; 04/21/2020 13.27    Time 5    Period Weeks    Status On-going    Target Date 05/26/20                 Plan - 05/10/20 1613    Clinical Impression Statement Pt demonstrates improved activity tolerance by performing multiple exercises after walking laps around gym for endurance. Pt's RPE (borg) did not increase above a 4/10, and HR ranged from 73-97 bpm with exercise. The pt did still require mutliple brief rest breaks. The pt will benefit from further skilled PT to improve functional capacity/gait ability and LE strength and endurance to improve QOL.    Personal Factors and Comorbidities Age;Comorbidity 3+;Finances;Fitness;Past/Current Experience;Transportation    Comorbidities HFpEF, hypothyroidism, anemia, arthritis, breast cancer, CHF, CKD, DM, Dyslipidemia, HTN, gout, hypothyroidism, morbid obesity, a fib, PE, thyroid goiter.    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    Rehab Potential Good    PT  Frequency 1x / week    PT Duration Other (comment)   5 weeks   PT Treatment/Interventions Patient/family education;Therapeutic activities;Therapeutic exercise;Functional mobility training;Balance training;Neuromuscular re-education;ADLs/Self Care Home Management;Aquatic Therapy;Canalith Repostioning;Cryotherapy;Electrical Stimulation;Iontophoresis 74m/ml Dexamethasone;Moist Heat;Traction;Ultrasound;DME Instruction;Gait training;Stair training;Orthotic Fit/Training;Manual techniques;Dry needling;Taping;Vestibular;Visual/perceptual remediation/compensation    PT Next Visit Plan Progress functional capacity with titrating O2 as tolerated. Upright tolerance and LE exercise; cardiovascular endurance amb. exercises; agility walking; progress seated LE resistance training; reassess goals    PT Home Exercise Plan Issued Modified BORG RPE scale for patient to utilize with wakling/exercises in the home.    Consulted and Agree with Plan of Care Patient           Patient will benefit from skilled therapeutic intervention in order to improve the following deficits and impairments:  Abnormal gait,Decreased balance,Decreased endurance,Decreased mobility,Difficulty walking,Obesity,Decreased strength,Cardiopulmonary status limiting activity,Decreased activity tolerance,Decreased safety awareness,Impaired flexibility,Impaired perceived functional ability,Impaired sensation,Postural dysfunction,Improper body mechanics  Visit Diagnosis: Other abnormalities of gait and mobility  Muscle weakness (generalized)     Problem List Patient Active Problem List   Diagnosis Date Noted  . Physical deconditioning 03/16/2020  . Abnormal findings on diagnostic imaging of lung 03/16/2020  . AF (paroxysmal atrial fibrillation) ([place]   . Acquired thrombophilia ([place]   . Acute on chronic respiratory failure with hypoxia ([place] 02/21/2020  . Acute on chronic respiratory failure with hypoxemia ([place] 02/21/2020  . Atrial  fibrillation, chronic ([place] 02/09/2020  . Dyslipidemia 02/09/2020  . Diabetes mellitus type 2 in obese ([place] 02/09/2020  . Acquired hypothyroidism 02/09/2020  . Thrush, oral 02/09/2020  . Morbid obesity with BMI of 60.0-69.9, adult ([place] 02/03/2020  . Chronic anticoagulation 12/30/2019  . OSA on CPAP 08/04/2019  . Atrial fibrillation with RVR ([place] 10/18/2018  . Acute hypoxemic respiratory failure ([place] 03/29/2018  . Acute on chronic diastolic CHF (congestive heart failure) ([place] 02/04/2018  .  Lymphedema 02/04/2018  . Essential hypertension 12/09/2017  . Long term (current) use of aromatase inhibitors 11/17/2017  . Elevated alkaline phosphatase level 05/20/2017  . Goals of care, counseling/discussion 05/14/2017  . Chronic renal disease, stage III ([place]) 07/08/2015  . Iron deficiency anemia 07/08/2015  . Acute renal failure superimposed on stage 3 chronic kidney disease ([place]) 10/22/2014  . History of breast cancer 10/22/2014  . Elevated troponin 10/22/2014  . Pulmonary emboli ([place]) 10/18/2014  . History of pulmonary embolus (PE) 10/18/2014  . Candidiasis of breast 07/06/2014  . Malignant neoplasm of right female breast ([place]) 07/24/2012   Ricard Dillon PT, DPT 05/10/2020, 4:16 PM  [place] MAIN [facility] SERVICES [address] [place], [place], [address] Phone: [phone]   Fax:  [phone]  Name: Nicole Schmidt MRN: [id number] Date of Birth: [birth year]

## 2020-05-13 ENCOUNTER — Ambulatory Visit: Payer: BC Managed Care – PPO

## 2020-05-13 DIAGNOSIS — I10 Essential (primary) hypertension: Secondary | ICD-10-CM | POA: Diagnosis not present

## 2020-05-13 DIAGNOSIS — N184 Chronic kidney disease, stage 4 (severe): Secondary | ICD-10-CM | POA: Diagnosis not present

## 2020-05-13 DIAGNOSIS — E1122 Type 2 diabetes mellitus with diabetic chronic kidney disease: Secondary | ICD-10-CM | POA: Diagnosis not present

## 2020-05-13 DIAGNOSIS — N2581 Secondary hyperparathyroidism of renal origin: Secondary | ICD-10-CM | POA: Diagnosis not present

## 2020-05-17 ENCOUNTER — Ambulatory Visit: Payer: BC Managed Care – PPO

## 2020-05-18 ENCOUNTER — Ambulatory Visit: Payer: BC Managed Care – PPO | Admitting: Cardiovascular Disease

## 2020-05-18 ENCOUNTER — Encounter: Payer: Self-pay | Admitting: Cardiovascular Disease

## 2020-05-18 ENCOUNTER — Other Ambulatory Visit: Payer: Self-pay

## 2020-05-18 VITALS — BP 140/70 | HR 64 | Ht 64.0 in | Wt 362.0 lb

## 2020-05-18 DIAGNOSIS — I5032 Chronic diastolic (congestive) heart failure: Secondary | ICD-10-CM | POA: Diagnosis not present

## 2020-05-18 DIAGNOSIS — I482 Chronic atrial fibrillation, unspecified: Secondary | ICD-10-CM | POA: Diagnosis not present

## 2020-05-18 DIAGNOSIS — Z9989 Dependence on other enabling machines and devices: Secondary | ICD-10-CM

## 2020-05-18 DIAGNOSIS — I4819 Other persistent atrial fibrillation: Secondary | ICD-10-CM | POA: Diagnosis not present

## 2020-05-18 DIAGNOSIS — G4733 Obstructive sleep apnea (adult) (pediatric): Secondary | ICD-10-CM | POA: Diagnosis not present

## 2020-05-18 DIAGNOSIS — Z86711 Personal history of pulmonary embolism: Secondary | ICD-10-CM

## 2020-05-18 DIAGNOSIS — I10 Essential (primary) hypertension: Secondary | ICD-10-CM

## 2020-05-18 DIAGNOSIS — J9601 Acute respiratory failure with hypoxia: Secondary | ICD-10-CM | POA: Diagnosis not present

## 2020-05-18 DIAGNOSIS — N189 Chronic kidney disease, unspecified: Secondary | ICD-10-CM

## 2020-05-18 NOTE — Progress Notes (Signed)
Cardiology Office Note   Date:  05/18/2020   ID:  Nicole Schmidt, DOB 1966-02-04, MRN [id number]  PCP:  Lenard Simmer, MD  Cardiologist:   Kathlyn Sacramento, MD   Chief Complaint  Patient presents with  . Other    3 month f/u. Meds reviewed verbally with pt.      History of Present Illness: Nicole Schmidt is a 55 y.o. female who presents for for follow-up visit regarding atrial fibrillation and chronic diastolic heart failure. She has known history of breast cancer status post radiation, diabetes mellitus, essential hypertension, chronic kidney disease, thyroid disease, morbid obesity, sleep apnea on CPAP, anemia of chronic disease, gout, pulmonary embolism and chronic diastolic heart failure. She was hospitalized in September 2020 with A. fib with RVR.  She was diagnosed with community-acquired pneumonia which was treated with antibiotics.  She was started on metoprolol 50 mg twice daily for atrial fibrillation.  She was already on Eliquis for previous pulmonary embolism and this was continued.  Echocardiogram showed normal LV systolic function with diastolic dysfunction and moderate pulmonary hypertension.  She underwent successful cardioversion in October, 2020.  No recurrent atrial fibrillation since then.    She was hospitalized in December of 2021 for acute respiratory distress due to heart failure and pneumonia.  Echocardiogram showed an EF of 65 to 70% with severe LVH.  Spironolactone was discontinued due to hyperkalemia. During last visit, I decrease the dose of metoprolol to 25 mg twice daily due to bradycardia.  She has been doing reasonably well and reports stable exertional dyspnea.  No chest pain.  She gained weight since last visit no significant change in leg edema.  She does admit to excessive sodium intake on occasion.  Past Medical History:  Diagnosis Date  . (HFpEF) heart failure with preserved ejection fraction ([place])    a. 10/2018 Echo: EF 60-65%, diast dysfxn, RVSP  50.7 mmHg, mildly dil LA.  Marland Kitchen Acquired hypothyroidism 02/09/2020  . Anemia   . Arthritis   . Breast cancer ([place]) 2014   right breast cancer  . Breast cancer of upper-inner quadrant of right female breast ([place])    Right breast invasive CA and DCIS , 7 mm T1,N0,M0. Er/PR pos, her 2 negative.  Margins 1 mm.  . CHF (congestive heart failure) ([place])   . CKD (chronic kidney disease), stage III ([place])   . Diabetes mellitus type 2 in obese ([place]) 02/09/2020  . Diabetes mellitus without complication ([place])   . Dyslipidemia 02/09/2020  . Essential hypertension   . Gout   . Hypothyroidism   . Menopause    age 39  . Morbid obesity ([place])   . Persistent atrial fibrillation ([place])    a. Dx 10/2018 in setting of PNA. CHA2DS2VASc = 4-->Eliquis; b. 11/2018 s/p successful DCCV.  Marland Kitchen Personal history of radiation therapy   . Pulmonary embolism ([place]) 10/2014   a. Chronic eliquis.  . Thyroid goiter     Past Surgical History:  Procedure Laterality Date  . BREAST BIOPSY Right 2014   breast ca  . BREAST EXCISIONAL BIOPSY Right 07/19/2012   breast ca  . BREAST SURGERY Right 2014   with sentinel node bx subareaolar duct excision  . CARDIOVERSION N/A 11/28/2018   Procedure: CARDIOVERSION;  Surgeon: Minna Merritts, MD;  Location: [facility] ORS;  Service: Cardiovascular;  Laterality: N/A;  . COLONOSCOPY WITH PROPOFOL N/A 01/30/2017   Procedure: COLONOSCOPY WITH PROPOFOL;  Surgeon: Christene Lye, MD;  Location: [facility] ENDOSCOPY;  Service: Endoscopy;  Laterality: N/A;     Current Outpatient Medications  Medication Sig Dispense Refill  . acetaminophen (TYLENOL) 500 MG tablet Take 1,000 mg by mouth every 6 (six) hours as needed (for pain).    Marland Kitchen amLODipine (NORVASC) 5 MG tablet Take 5 mg by mouth 2 (two) times daily.    Marland Kitchen atorvastatin (LIPITOR) 20 MG tablet Take 20 mg by mouth at bedtime.     . calcitRIOL (ROCALTROL) 0.25 MCG capsule Take 0.25 mcg by mouth daily.    . calcium carbonate (OSCAL) 1500 (600 Ca)  MG TABS tablet Take 600 mg of elemental calcium by mouth daily.     . Cholecalciferol (VITAMIN D3) 50000 units TABS Take 50,000 Units by mouth every Friday.     Marland Kitchen CINNAMON PO Take 1,000 mg by mouth daily.     . cloNIDine (CATAPRES) 0.2 MG tablet Take 1 tablet (0.2 mg total) by mouth 2 (two) times daily. 60 tablet 0  . CRANBERRY CONCENTRATE PO Take 1 capsule by mouth daily.    . Dulaglutide 1.5 MG/0.5ML SOPN Inject 1.5 mg into the skin every 7 (seven) days.    Marland Kitchen ELIQUIS 5 MG TABS tablet TAKE 1 TABLET TWICE A DAY 284 tablet 1  . folic acid (FOLVITE) 132 MCG tablet Take 800 mcg by mouth daily.     . furosemide (LASIX) 40 MG tablet Take 1 tablet (40 mg total) by mouth daily.    Marland Kitchen KLOR-CON M20 20 MEQ tablet TAKE 1 TABLET DAILY 90 tablet 3  . levothyroxine (SYNTHROID, LEVOTHROID) 112 MCG tablet Take 112 mcg daily before breakfast by mouth.    . Melatonin 10 MG TABS Take 10 mg by mouth at bedtime.    . metoprolol tartrate (LOPRESSOR) 25 MG tablet Take 1 tablet (25 mg total) by mouth 2 (two) times daily. 180 tablet 2  . vitamin C (ASCORBIC ACID) 500 MG tablet Take 500 mg by mouth 2 (two) times daily.     No current facility-administered medications for this visit.    Allergies:   Patient has no known allergies.    Social History:  The patient  reports that she has never smoked. She has never used smokeless tobacco. She reports that she does not drink alcohol and does not use drugs.   Family History:  The patient's family history includes Atrial fibrillation in her mother; Colon cancer (age of onset: 19) in her cousin; Diabetes in her father; Heart attack in her mother; Heart failure in her mother; Hypertension in her father and mother; Parkinson's disease in her father; Rheum arthritis in her mother.    ROS:  Please see the history of present illness.   Otherwise, review of systems are positive for none.   All other systems are reviewed and negative.    PHYSICAL EXAM: VS:  BP 140/70 (BP  Location: Right Arm, Patient Position: Sitting, Cuff Size: Large)   Pulse 64   Ht 5\' 4"  (1.626 m)   Wt (!) 362 lb (164.2 kg)   LMP 07/17/2012   SpO2 97%   BMI 62.14 kg/m  , BMI Body mass index is 62.14 kg/m. GEN: Well nourished, well developed, in no acute distress  HEENT: normal  Neck: no JVD, carotid bruits, or masses Cardiac: RRR; no murmurs, rubs, or gallops, mild edema  Respiratory:  clear to auscultation bilaterally, normal work of breathing GI: soft, nontender, nondistended, + BS MS: no deformity or atrophy  Skin: warm and dry, no rash Neuro:  Strength and  sensation are intact Psych: euthymic mood, full affect   EKG:  EKG is ordered today. The ekg ordered today demonstrates sinus rhythm with PVCs.  Heart rate is 64 bpm.  Recent Labs: 02/09/2020: TSH 0.727 02/17/2020: Magnesium 2.3 02/20/2020: ALT 26; B Natriuretic Peptide 210.7 02/27/2020: BUN 40; Creatinine, Ser 1.83; Hemoglobin 9.9; Platelets 232; Potassium 5.1; Sodium 144    Lipid Panel    Component Value Date/Time   CHOL 122 02/09/2020 1340   TRIG 153 (H) 02/09/2020 1340   HDL 49 02/09/2020 1340   CHOLHDL 2.5 02/09/2020 1340   VLDL 31 02/09/2020 1340   LDLCALC 42 02/09/2020 1340      Wt Readings from Last 3 Encounters:  05/18/20 (!) 362 lb (164.2 kg)  03/25/20 (!) 348 lb (157.9 kg)  03/16/20 (!) 355 lb (161 kg)        No flowsheet data found.    ASSESSMENT AND PLAN:  1. Persistent atrial fibrillation: She continues to be in sinus rhythm.  She is no longer bradycardic after decreasing the dose of metoprolol to 25 mg twice daily.   Chads vas score is 4 and currently she is on Eliquis.    2. Chronic diastolic heart failure: She appears to be euvolemic on furosemide 40 mg once daily.  3.  Essential hypertension: Blood pressure is reasonably controlled on current medications.    4. Morbid obesity: She is trying to lose weight but has not been successful.  5. History of pulmonary embolism: On  anticoagulation with Eliquis.  6.  Obstructive sleep apnea: Doing better with CPAP.  7.  Chronic kidney disease: Most recent labs showed a creatinine of 1.83.    Disposition:   FU with me in 6 months  Signed,  Kathlyn Sacramento, MD  05/18/2020 4:12 PM    [facility]

## 2020-05-18 NOTE — Patient Instructions (Addendum)
Medication Instructions:  No changes  *If you need a refill on your cardiac medications before your next appointment, please call your pharmacy*   Lab Work: None ordered   If you have labs (blood work) drawn today and your tests are completely normal, you will receive your results only by: Marland Kitchen MyChart Message (if you have MyChart) OR . A paper copy in the mail If you have any lab test that is abnormal or we need to change your treatment, we will call you to review the results.   Testing/Procedures: None ordered   Follow-Up: At [facility], you and your health needs are our priority.  As part of our continuing mission to provide you with exceptional heart care, we have created designated Provider Care Teams.  These Care Teams include your primary Cardiologist (physician) and Advanced Practice Providers (APPs -  Physician Assistants and Nurse Practitioners) who all work together to provide you with the care you need, when you need it.  We recommend signing up for the patient portal called "MyChart".  Sign up information is provided on this After Visit Summary.  MyChart is used to connect with patients for Virtual Visits (Telemedicine).  Patients are able to view lab/test results, encounter notes, upcoming appointments, etc.  Non-urgent messages can be sent to your provider as well.   To learn more about what you can do with MyChart, go to [email or web address].    Your next appointment:   Your physician wants you to follow-up in: 6 months You will receive a reminder letter in the mail two months in advance. If you don't receive a letter, please call our office to schedule the follow-up appointment.   The format for your next appointment:   In Person  Provider:   You may see Kathlyn Sacramento, MD or one of the following Advanced Practice Providers on your designated Care Team:    Murray Hodgkins, NP  Christell Faith, PA-C  Marrianne Mood, PA-C  Cadence [place], [place]  Laurann [place], NP

## 2020-05-28 DIAGNOSIS — E063 Autoimmune thyroiditis: Secondary | ICD-10-CM | POA: Diagnosis not present

## 2020-05-28 DIAGNOSIS — E1165 Type 2 diabetes mellitus with hyperglycemia: Secondary | ICD-10-CM | POA: Diagnosis not present

## 2020-05-28 DIAGNOSIS — E041 Nontoxic single thyroid nodule: Secondary | ICD-10-CM | POA: Diagnosis not present

## 2020-05-28 DIAGNOSIS — E039 Hypothyroidism, unspecified: Secondary | ICD-10-CM | POA: Diagnosis not present

## 2020-05-28 DIAGNOSIS — D352 Benign neoplasm of pituitary gland: Secondary | ICD-10-CM | POA: Diagnosis not present

## 2020-05-28 DIAGNOSIS — E785 Hyperlipidemia, unspecified: Secondary | ICD-10-CM | POA: Diagnosis not present

## 2020-06-03 DIAGNOSIS — D352 Benign neoplasm of pituitary gland: Secondary | ICD-10-CM | POA: Diagnosis not present

## 2020-06-03 DIAGNOSIS — C50819 Malignant neoplasm of overlapping sites of unspecified female breast: Secondary | ICD-10-CM | POA: Diagnosis not present

## 2020-06-03 DIAGNOSIS — E119 Type 2 diabetes mellitus without complications: Secondary | ICD-10-CM | POA: Diagnosis not present

## 2020-06-03 DIAGNOSIS — E559 Vitamin D deficiency, unspecified: Secondary | ICD-10-CM | POA: Diagnosis not present

## 2020-06-03 DIAGNOSIS — L089 Local infection of the skin and subcutaneous tissue, unspecified: Secondary | ICD-10-CM | POA: Diagnosis not present

## 2020-06-03 DIAGNOSIS — L729 Follicular cyst of the skin and subcutaneous tissue, unspecified: Secondary | ICD-10-CM | POA: Diagnosis not present

## 2020-06-03 DIAGNOSIS — E039 Hypothyroidism, unspecified: Secondary | ICD-10-CM | POA: Diagnosis not present

## 2020-06-03 DIAGNOSIS — I10 Essential (primary) hypertension: Secondary | ICD-10-CM | POA: Diagnosis not present

## 2020-06-03 DIAGNOSIS — E785 Hyperlipidemia, unspecified: Secondary | ICD-10-CM | POA: Diagnosis not present

## 2020-06-03 DIAGNOSIS — I5043 Acute on chronic combined systolic (congestive) and diastolic (congestive) heart failure: Secondary | ICD-10-CM | POA: Diagnosis not present

## 2020-06-03 DIAGNOSIS — I2699 Other pulmonary embolism without acute cor pulmonale: Secondary | ICD-10-CM | POA: Diagnosis not present

## 2020-06-03 DIAGNOSIS — E790 Hyperuricemia without signs of inflammatory arthritis and tophaceous disease: Secondary | ICD-10-CM | POA: Diagnosis not present

## 2020-06-17 ENCOUNTER — Encounter: Payer: Self-pay | Admitting: Pulmonary Disease

## 2020-06-17 ENCOUNTER — Other Ambulatory Visit: Payer: Self-pay

## 2020-06-17 ENCOUNTER — Ambulatory Visit: Payer: BC Managed Care – PPO | Admitting: Pulmonary Disease

## 2020-06-17 VITALS — BP 138/80 | HR 63 | Temp 98.2°F | Ht 64.0 in | Wt 358.8 lb

## 2020-06-17 DIAGNOSIS — J9621 Acute and chronic respiratory failure with hypoxia: Secondary | ICD-10-CM

## 2020-06-17 DIAGNOSIS — G4733 Obstructive sleep apnea (adult) (pediatric): Secondary | ICD-10-CM | POA: Diagnosis not present

## 2020-06-17 DIAGNOSIS — J9601 Acute respiratory failure with hypoxia: Secondary | ICD-10-CM | POA: Diagnosis not present

## 2020-06-17 DIAGNOSIS — I482 Chronic atrial fibrillation, unspecified: Secondary | ICD-10-CM | POA: Diagnosis not present

## 2020-06-17 DIAGNOSIS — I5032 Chronic diastolic (congestive) heart failure: Secondary | ICD-10-CM | POA: Diagnosis not present

## 2020-06-17 NOTE — Patient Instructions (Signed)
Can call [place] [facility] [place] at [phone]  Follow up in 6 months

## 2020-06-17 NOTE — Progress Notes (Signed)
[facility]  Chief Complaint  Patient presents with  . Follow-up    Wearing cpap avg 5-6hr ngihtly-- feels pressure and mask is okay.     Constitutional:  BP 138/80 (BP Location: Left Arm, Cuff Size: Large)   Pulse 63   Temp 98.2 F (36.8 C) (Temporal)   Ht 5\' 4"  (1.626 m)   Wt (!) 358 lb 12.8 oz (162.8 kg)   LMP 07/17/2012   SpO2 97%   BMI 61.59 kg/m   Past Medical History:  PNA, Diastolic CHF, A fib, CKD, HTN, Hypothyroidism, HLD, Breast cancer, DM, PE 2016, Polyneuropathy with ataxia  Past Surgical History:  She  has a past surgical history that includes Breast surgery (Right, 2014); Colonoscopy with propofol (N/A, 01/30/2017); Cardioversion (N/A, 11/28/2018); Breast biopsy (Right, 2014); and Breast excisional biopsy (Right, 07/19/2012).  Brief Summary:  Nicole Schmidt is a 55 y.o. female with obstructive sleep apnea, obesity hypoventilation syndrome and chronic hypoxic respiratory failure.      Subjective:   She uses 1 to 2 liters oxygen with exertion and sleep.  She has a pulse oximeter at home.  She is trying to adjust her diet to get weight down.  Has hybrid mask.  This fits well.  Feels pressure setting on CPAP is okay.  CPAP download showed AHI 1.2 with median CPAP 6 cm H2O.  Physical Exam:   Appearance - well kempt, wearing oxygen, sitting in wheelchair  ENMT - no sinus tenderness, no oral exudate, no LAN, Mallampati 3 airway, no stridor  Respiratory - equal breath sounds bilaterally, no wheezing or rales  CV - s1s2 regular rate and rhythm, no murmurs  Ext - no clubbing, no edema  Skin - no rashes  Psych - normal mood and affect   Chest Imaging:   CT angio chest 10/18/14 >> acute PE with RV strain  Sleep Tests:   HST 04/26/19 >> AHI 17.1, SpO2 low 54%.  Spent 233.9 min with SpO2 < 89%.  CPAP titration 05/22/19 >> CPAP 15 cm H2O >> AHI 2.2  Cardiac Tests:   Echo 10/20/18 >> EF 60 to 65%, RVSP 50.7  mmHg  Social History:  She  reports that she has never smoked. She has never used smokeless tobacco. She reports that she does not drink alcohol and does not use drugs.  Family History:  Her family history includes Atrial fibrillation in her mother; Colon cancer (age of onset: 7) in her cousin; Diabetes in her father; Heart attack in her mother; Heart failure in her mother; Hypertension in her father and mother; Parkinson's disease in her father; Rheum arthritis in her mother.     Assessment/Plan:   Obstructive sleep apnea. - she is compliant with CPAP and reports benefit from therapy - she uses [employer] for her DME - continue auto CPAP 5 to 20 cm H2O  Obesity hypoventilation syndrome. - provided her contact information for weight management center  Chronic respiratory failure with hypoxia. - goal SpO2 > 90% - 1 to 2 liters with exertion and at night with CPAP  History of pulmonary embolism. - eliquis per her PCP  Time Spent Involved in Patient Care on Day of Examination:  24 minutes  Follow up:  Patient Instructions  Can call [facility] at [phone]  Follow up in 6 months   Medication List:   Allergies as of 06/17/2020   No Known Allergies     Medication List  Accurate as of Jun 17, 2020  1:08 PM. If you have any questions, ask your nurse or doctor.        acetaminophen 500 MG tablet Commonly known as: TYLENOL Take 1,000 mg by mouth every 6 (six) hours as needed (for pain).   amLODipine 5 MG tablet Commonly known as: NORVASC Take 5 mg by mouth 2 (two) times daily.   atorvastatin 20 MG tablet Commonly known as: LIPITOR Take 20 mg by mouth at bedtime.   calcitRIOL 0.25 MCG capsule Commonly known as: ROCALTROL Take 0.25 mcg by mouth daily.   calcium carbonate 1500 (600 Ca) MG Tabs tablet Commonly known as: OSCAL Take 600 mg of elemental calcium by mouth daily.   CINNAMON PO Take 1,000 mg by mouth daily.   cloNIDine  0.2 MG tablet Commonly known as: CATAPRES Take 1 tablet (0.2 mg total) by mouth 2 (two) times daily.   CRANBERRY CONCENTRATE PO Take 1 capsule by mouth daily.   Dulaglutide 1.5 MG/0.5ML Sopn Inject 1.5 mg into the skin every 7 (seven) days.   Eliquis 5 MG Tabs tablet Generic drug: apixaban TAKE 1 TABLET TWICE A DAY   folic acid 226 MCG tablet Commonly known as: FOLVITE Take 800 mcg by mouth daily.   furosemide 40 MG tablet Commonly known as: LASIX Take 1 tablet (40 mg total) by mouth daily.   Klor-Con M20 20 MEQ tablet Generic drug: potassium chloride SA TAKE 1 TABLET DAILY   levothyroxine 112 MCG tablet Commonly known as: SYNTHROID Take 112 mcg daily before breakfast by mouth.   Melatonin 10 MG Tabs Take 10 mg by mouth at bedtime.   metoprolol tartrate 25 MG tablet Commonly known as: LOPRESSOR Take 1 tablet (25 mg total) by mouth 2 (two) times daily.   vitamin C 500 MG tablet Commonly known as: ASCORBIC ACID Take 500 mg by mouth 2 (two) times daily.   Vitamin D3 1.25 MG (50000 UT) Tabs Take 50,000 Units by mouth every Friday.       Signature:  Chesley Mires, MD [place] Pager - [phone] 06/17/2020, 1:08 PM

## 2020-06-23 ENCOUNTER — Other Ambulatory Visit: Payer: Self-pay

## 2020-06-23 DIAGNOSIS — C50911 Malignant neoplasm of unspecified site of right female breast: Secondary | ICD-10-CM

## 2020-06-23 DIAGNOSIS — I2782 Chronic pulmonary embolism: Secondary | ICD-10-CM

## 2020-06-23 MED ORDER — ELIQUIS 5 MG PO TABS: 1.0000 | ORAL_TABLET | Freq: Two times a day (BID) | ORAL | 1 refills | Status: DC

## 2020-06-24 DIAGNOSIS — L089 Local infection of the skin and subcutaneous tissue, unspecified: Secondary | ICD-10-CM | POA: Diagnosis not present

## 2020-06-24 DIAGNOSIS — L729 Follicular cyst of the skin and subcutaneous tissue, unspecified: Secondary | ICD-10-CM | POA: Diagnosis not present

## 2020-06-28 DIAGNOSIS — G4733 Obstructive sleep apnea (adult) (pediatric): Secondary | ICD-10-CM | POA: Diagnosis not present

## 2020-07-09 ENCOUNTER — Other Ambulatory Visit: Payer: Self-pay

## 2020-07-09 DIAGNOSIS — I2782 Chronic pulmonary embolism: Secondary | ICD-10-CM

## 2020-07-09 MED ORDER — ELIQUIS 5 MG PO TABS: 1.0000 | ORAL_TABLET | Freq: Two times a day (BID) | ORAL | 1 refills | Status: DC

## 2020-07-13 ENCOUNTER — Other Ambulatory Visit: Payer: Self-pay

## 2020-07-13 DIAGNOSIS — I2782 Chronic pulmonary embolism: Secondary | ICD-10-CM

## 2020-07-13 MED ORDER — ELIQUIS 5 MG PO TABS: 1.0000 | ORAL_TABLET | Freq: Two times a day (BID) | ORAL | 1 refills | Status: DC

## 2020-07-13 NOTE — Telephone Encounter (Signed)
Let pt know that [facility]

## 2020-07-27 NOTE — Progress Notes (Addendum)
Patient ID: TEEA Schmidt, female    DOB: 1965/10/03, 55 y.o.   MRN: [id number]  HPI  Nicole Schmidt is a 55 y/o female with a history of breast cancer, DM, HTN, CKD, thyroid disease, anemia, gout, pulmonary embolus and chronic heart failure.   Echo report from 02/09/20 reviewed and showed an EF of 65-70% along with severe LVH and moderate LAE. Echo report from 10/20/2018 reviewed and showed an EF of 60-65% along with an elevated PA pressure of 50.7 mmHg. Echo report from 11/20/17 reviewed and showed an EF of 55-60%.  Admitted 02/21/20 due to shortness of breath and low O2 sats. Given IV lasix with transition to oral diuretics. Can increase oxygen to 3-4L with ambulation to keep sats >90%. Discharged after 2 days. Admitted 02/09/20 due to shortness of breath, weight gain and pedal edema. Given IV lasix along with IV antibiotics for concern about pneumonia. Then transitioned to oral medications. Pulmonology consult obtained. Unable to be weaned off oxygen and needed 2L. Aldactone and losartan held due to hyperkalemia. Discharged after 9 days.    She presents today for a follow-up visit with a chief complaint of moderate fatigue upon minimal exertion. She describes this as chronic in nature having been present for several years. She has associated cough, shortness of breath, pedal edema and leg weakness along with this. She denies any dizziness, chest pain, difficulty sleeping, abdominal distention, palpitations or weight gain.   Currently participating in weekly PT with [facility].   Past Medical History:  Diagnosis Date   (HFpEF) heart failure with preserved ejection fraction ([place])    a. 10/2018 Echo: EF 60-65%, diast dysfxn, RVSP 50.7 mmHg, mildly dil LA.   Acquired hypothyroidism 02/09/2020   Anemia    Arthritis    Breast cancer ([place]) 2014   right breast cancer   Breast cancer of upper-inner quadrant of right female breast ([place])    Right breast invasive CA and DCIS , 7 mm T1,N0,M0.  Er/PR pos, her 2 negative.  Margins 1 mm.   CHF (congestive heart failure) (HCC)    CKD (chronic kidney disease), stage III (HCC)    Diabetes mellitus type 2 in obese ([place]) 02/09/2020   Diabetes mellitus without complication ([place])    Dyslipidemia 02/09/2020   Essential hypertension    Gout    Hypothyroidism    Menopause    age 52   Morbid obesity ([place])    Persistent atrial fibrillation ([place])    a. Dx 10/2018 in setting of PNA. CHA2DS2VASc = 4-->Eliquis; b. 11/2018 s/p successful DCCV.   Personal history of radiation therapy    Pulmonary embolism ([place]) 10/2014   a. Chronic eliquis.   Thyroid goiter    Past Surgical History:  Procedure Laterality Date   BREAST BIOPSY Right 2014   breast ca   BREAST EXCISIONAL BIOPSY Right 07/19/2012   breast ca   BREAST SURGERY Right 2014   with sentinel node bx subareaolar duct excision   CARDIOVERSION N/A 11/28/2018   Procedure: CARDIOVERSION;  Surgeon: Minna Merritts, MD;  Location: [facility] ORS;  Service: Cardiovascular;  Laterality: N/A;   COLONOSCOPY WITH PROPOFOL N/A 01/30/2017   Procedure: COLONOSCOPY WITH PROPOFOL;  Surgeon: Christene Lye, MD;  Location: [facility] ENDOSCOPY;  Service: Endoscopy;  Laterality: N/A;   Family History  Problem Relation Age of Onset   Diabetes Father    Hypertension Father    Parkinson's disease Father    Hypertension Mother    Rheum arthritis  Mother    Atrial fibrillation Mother    Heart failure Mother    Heart attack Mother        died in her mid-70's.   Colon cancer Cousin 18   Breast cancer Neg Hx    Social History   Tobacco Use   Smoking status: Never   Smokeless tobacco: Never  Substance Use Topics   Alcohol use: No    Alcohol/week: 0.0 standard drinks   No Known Allergies  Prior to Admission medications   Medication Sig Start Date End Date Taking? Authorizing Provider  acetaminophen (TYLENOL) 500 MG tablet Take 1,000 mg by mouth every 6 (six) hours as needed (for pain).   Yes  [provider]  amLODipine (NORVASC) 5 MG tablet Take 5 mg by mouth 2 (two) times daily. 09/18/18  Yes [provider]  apixaban (ELIQUIS) 5 MG TABS tablet Take 1 tablet (5 mg total) by mouth 2 (two) times daily. 07/13/20  Yes Sindy Guadeloupe, MD  atorvastatin (LIPITOR) 20 MG tablet Take 20 mg by mouth at bedtime.    Yes [provider]  calcitRIOL (ROCALTROL) 0.25 MCG capsule Take 0.25 mcg by mouth daily. 05/13/20  Yes [provider]  calcium carbonate (OSCAL) 1500 (600 Ca) MG TABS tablet Take 600 mg of elemental calcium by mouth daily.    Yes [provider]  Cholecalciferol (VITAMIN D3) 50000 units TABS Take 50,000 Units by mouth every Friday.    Yes [provider]  CINNAMON PO Take 1,000 mg by mouth daily.    Yes [provider]  cloNIDine (CATAPRES) 0.2 MG tablet Take 1 tablet (0.2 mg total) by mouth 2 (two) times daily. 10/21/18  Yes Mayo, Pete Pelt, MD  CRANBERRY CONCENTRATE PO Take 1 capsule by mouth daily.   Yes [provider]  Dulaglutide 1.5 MG/0.5ML SOPN Inject 1.5 mg into the skin every 7 (seven) days.   Yes [provider]  folic acid (FOLVITE) 413 MCG tablet Take 800 mcg by mouth daily.    Yes [provider]  furosemide (LASIX) 40 MG tablet Take 1 tablet (40 mg total) by mouth daily. 02/23/20  Yes Loletha Grayer, MD  KLOR-CON M20 20 MEQ tablet TAKE 1 TABLET DAILY 10/20/19  Yes Darylene Price A, FNP  levothyroxine (SYNTHROID, LEVOTHROID) 112 MCG tablet Take 112 mcg daily before breakfast by mouth.   Yes [provider]  Melatonin 10 MG TABS Take 10 mg by mouth at bedtime.   Yes [provider]  metoprolol tartrate (LOPRESSOR) 25 MG tablet Take 1 tablet (25 mg total) by mouth 2 (two) times daily. 03/25/20  Yes Wellington Hampshire, MD  vitamin C (ASCORBIC ACID) 500 MG tablet Take 500 mg by mouth 2 (two) times daily.   Yes [provider]  fluticasone-salmeterol (ADVAIR HFA)  230-21 MCG/ACT inhaler Inhale 2 puffs into the lungs 2 (two) times daily as needed.  Patient not taking: No sig reported  02/09/20  [provider]     Review of Systems  Constitutional:  Positive for fatigue (easily). Negative for appetite change.  HENT:  Negative for congestion, postnasal drip and sore throat.   Eyes: Negative.   Respiratory:  Positive for cough and shortness of breath.   Cardiovascular:  Positive for leg swelling. Negative for chest pain and palpitations.  Gastrointestinal:  Negative for abdominal distention and abdominal pain.  Endocrine: Negative.   Genitourinary: Negative.   Musculoskeletal:  Negative for back pain and neck pain.  Skin: Negative.   Allergic/Immunologic: Negative.   Neurological:  Positive for weakness. Negative for dizziness and light-headedness.  Hematological:  Negative for adenopathy. Does not bruise/bleed easily.  Psychiatric/Behavioral:  Negative for dysphoric mood and sleep disturbance. The patient is not nervous/anxious.    Vitals:   07/28/20 1307  BP: (!) 157/65  Pulse: 63  SpO2: 100%  Weight: (!) 356 lb 2 oz (161.5 kg)  Height: 5\' 4"  (1.626 m)   Wt Readings from Last 3 Encounters:  07/28/20 (!) 356 lb 2 oz (161.5 kg)  06/17/20 (!) 358 lb 12.8 oz (162.8 kg)  05/18/20 (!) 362 lb (164.2 kg)   Lab Results  Component Value Date   CREATININE 1.83 (H) 02/27/2020   CREATININE 2.03 (H) 02/23/2020   CREATININE 1.78 (H) 02/22/2020    Physical Exam Vitals reviewed.  Constitutional:      Appearance: Normal appearance.  HENT:     Head: Normocephalic and atraumatic.  Cardiovascular:     Rate and Rhythm: Normal rate and regular rhythm.  Pulmonary:     Effort: Pulmonary effort is normal. No respiratory distress.     Breath sounds: No wheezing or rales.  Abdominal:     General: There is no distension.     Palpations: Abdomen is soft.  Musculoskeletal:        General: No tenderness.     Cervical back: Normal range of  motion and neck supple.     Right lower leg: Edema (trace pitting) present.     Left lower leg: Edema (trace pitting) present.  Skin:    General: Skin is warm and dry.  Neurological:     General: No focal deficit present.     Mental Status: She is alert and oriented to person, place, and time.  Psychiatric:        Mood and Affect: Mood normal.        Behavior: Behavior normal.        Thought Content: Thought content normal.    Assessment & Plan:  1: Chronic heart failure with preserved ejection fraction with structural changes (LVH)- - NYHA class III - euvolemic today - weighing daily; reminded to weigh every morning, write the weight down and call for an overnight weight gain of >2 pounds or a weekly weight gain of >5 pounds - weight down 8 pounds from last visit here 5 months ago - saw cardiology Nicole Schmidt) 05/18/20 - not adding salt and has been reading food labels; reviewed the importance of closely following a 2000mg  sodium diet  - renal function limits medication usage - BNP 02/20/20 was 210.7 - PharmD reconciled medications with the patient  2: HTN- - BP mildly elevated today - CMP from 05/13/20 reviewed and showed sodium 141, potassium 4.0, creatinine 2.03 and GFR 27 - saw PCP Nicole Schmidt) March 2022 - saw nephrology Nicole Schmidt) 05/13/20  3: Lymphedema- - stage 2 - encouraged her to elevate her legs when sitting for long periods of time  4: Obstructive sleep apnea- - saw pulmonology Nicole Schmidt) 06/17/20 - wearing CPAP - now wearing oxygen at 1L around the clock and is hoping to eventually get off of it.    Medication list reviewed.   Return in 6 months or sooner for any questions/problems before then.

## 2020-07-28 ENCOUNTER — Ambulatory Visit: Payer: BC Managed Care – PPO | Attending: Family | Admitting: Family

## 2020-07-28 ENCOUNTER — Other Ambulatory Visit: Payer: Self-pay

## 2020-07-28 ENCOUNTER — Encounter: Payer: Self-pay | Admitting: Pharmacist

## 2020-07-28 ENCOUNTER — Encounter: Payer: Self-pay | Admitting: Family

## 2020-07-28 VITALS — BP 157/65 | HR 63 | Ht 64.0 in | Wt 356.1 lb

## 2020-07-28 DIAGNOSIS — Z79899 Other long term (current) drug therapy: Secondary | ICD-10-CM | POA: Insufficient documentation

## 2020-07-28 DIAGNOSIS — Z853 Personal history of malignant neoplasm of breast: Secondary | ICD-10-CM | POA: Diagnosis not present

## 2020-07-28 DIAGNOSIS — I890 Lymphedema, not elsewhere classified: Secondary | ICD-10-CM | POA: Insufficient documentation

## 2020-07-28 DIAGNOSIS — D649 Anemia, unspecified: Secondary | ICD-10-CM | POA: Diagnosis not present

## 2020-07-28 DIAGNOSIS — Z8249 Family history of ischemic heart disease and other diseases of the circulatory system: Secondary | ICD-10-CM | POA: Diagnosis not present

## 2020-07-28 DIAGNOSIS — Z833 Family history of diabetes mellitus: Secondary | ICD-10-CM | POA: Diagnosis not present

## 2020-07-28 DIAGNOSIS — Z79890 Hormone replacement therapy (postmenopausal): Secondary | ICD-10-CM | POA: Diagnosis not present

## 2020-07-28 DIAGNOSIS — I130 Hypertensive heart and chronic kidney disease with heart failure and stage 1 through stage 4 chronic kidney disease, or unspecified chronic kidney disease: Secondary | ICD-10-CM | POA: Diagnosis not present

## 2020-07-28 DIAGNOSIS — I5032 Chronic diastolic (congestive) heart failure: Secondary | ICD-10-CM | POA: Diagnosis not present

## 2020-07-28 DIAGNOSIS — E039 Hypothyroidism, unspecified: Secondary | ICD-10-CM | POA: Insufficient documentation

## 2020-07-28 DIAGNOSIS — Z9989 Dependence on other enabling machines and devices: Secondary | ICD-10-CM | POA: Diagnosis not present

## 2020-07-28 DIAGNOSIS — N183 Chronic kidney disease, stage 3 unspecified: Secondary | ICD-10-CM | POA: Insufficient documentation

## 2020-07-28 DIAGNOSIS — G4733 Obstructive sleep apnea (adult) (pediatric): Secondary | ICD-10-CM | POA: Insufficient documentation

## 2020-07-28 DIAGNOSIS — E1122 Type 2 diabetes mellitus with diabetic chronic kidney disease: Secondary | ICD-10-CM | POA: Diagnosis not present

## 2020-07-28 DIAGNOSIS — Z7901 Long term (current) use of anticoagulants: Secondary | ICD-10-CM | POA: Insufficient documentation

## 2020-07-28 DIAGNOSIS — I10 Essential (primary) hypertension: Secondary | ICD-10-CM

## 2020-07-28 DIAGNOSIS — Z86711 Personal history of pulmonary embolism: Secondary | ICD-10-CM | POA: Insufficient documentation

## 2020-07-28 NOTE — Progress Notes (Signed)
[facility] - PHARMACIST COUNSELING NOTE  Guideline-Directed Medical Therapy/Evidence Based Medicine  ACE/ARB/ARNI: N/A Beta Blocker: Metoprolol tartrate 25 mg twice daily Aldosterone Antagonist:  N/A - previously experienced hyperkalemia when on spironolactone Diuretic: Furosemide 40 mg daily SGLT2i:  N/A  Adherence Assessment  Do you ever forget to take your medication? [] Yes [x] No  Do you ever skip doses due to side effects? [] Yes [x] No  Do you have trouble affording your medicines? [] Yes [x] No  Are you ever unable to pick up your medication due to transportation difficulties? [] Yes [x] No  Do you ever stop taking your medications because you don't believe they are helping? [] Yes [x] No  Do you check your weight daily? [x] Yes [] No   Adherence strategy: Pillbox - pt fills it herself  Barriers to obtaining medications: N/A  Vital signs: HR 63, BP 157/65, weight (pounds) 356 ECHO: Date 02/09/2020, EF 65-70%, notes severe LVH, LA moderately dilated, RA mildly dilated  BMP Latest Ref Rng & Units 02/27/2020 02/23/2020 02/22/2020  Glucose 65 - 99 mg/dL 149(H) 129(H) 131(H)  BUN 6 - 24 mg/dL 40(H) 60(H) 53(H)  Creatinine 0.57 - 1.00 mg/dL 1.83(H) 2.03(H) 1.78(H)  BUN/Creat Ratio 9 - 23 22 - -  Sodium 134 - 144 mmol/L 144 143 143  Potassium 3.5 - 5.2 mmol/L 5.1 4.9 4.6  Chloride 96 - 106 mmol/L 102 99 102  CO2 20 - 29 mmol/L 32(H) 31 32  Calcium 8.7 - 10.2 mg/dL 9.2 8.4(L) 8.5(L)    Past Medical History:  Diagnosis Date   (HFpEF) heart failure with preserved ejection fraction ([place])    a. 10/2018 Echo: EF 60-65%, diast dysfxn, RVSP 50.7 mmHg, mildly dil LA.   Acquired hypothyroidism 02/09/2020   Anemia    Arthritis    Breast cancer ([place]) 2014   right breast cancer   Breast cancer of upper-inner quadrant of right female breast ([place])    Right breast invasive CA and DCIS , 7 mm T1,N0,M0. Er/PR pos, her 2 negative.  Margins 1 mm.   CHF  (congestive heart failure) (HCC)    CKD (chronic kidney disease), stage III (HCC)    Diabetes mellitus type 2 in obese ([place]) 02/09/2020   Diabetes mellitus without complication ([place])    Dyslipidemia 02/09/2020   Essential hypertension    Gout    Hypothyroidism    Menopause    age 63   Morbid obesity ([place])    Persistent atrial fibrillation ([place])    a. Dx 10/2018 in setting of PNA. CHA2DS2VASc = 4-->Eliquis; b. 11/2018 s/p successful DCCV.   Personal history of radiation therapy    Pulmonary embolism ([place]) 10/2014   a. Chronic eliquis.   Thyroid goiter     ASSESSMENT 55 year old female who presents to the HF clinic for a follow up visit. Pt denies symptoms/episodes of hypotension. Pt was previously on metoprolol tartrate 50 mg twice daily, however this was decreased on 03/25/2020 due to bradycardia. Pt doing much better on this dose. She does not have any complaints at this time and is scheduled to see nephrology again on 09/16/2020. Pt Scr up to 2.03 (05/13/2020) from 1.61 (09/25/2019).   Recent ED Visit (past 6 months): Date - 02/09/2020 and 02/21/2020, [facility] - acute CHF  PLAN CHF/HTN -continue metoprolol tartrate 25 mg twice daily, furosemide 40 mg daily, amlodipine 5 mg twice daily, and clonidine 0.2 mg twice daily  -consider addition of SGLT2i at a later visit if renal function allows -would avoid  spironolactone at this time as pt has experienced hyperkalemia in the past and renal function is worsening  Afib -HR today is 63 -continue apixaban 5 mg twice daily and metoprolol as above  CKD/Secondary hyperparathyroidism -05/13/2020 PTH 90, phosphorus 4.9, calcium 9.7 -continue calcitriol 0.25 mcg daily  -pt has follow up appointment with nephrology on 09/16/2020  DM -continue cinnamon 1000 mg daily and dulaglutide 1.5 mg every 7 days  Hypothyroidism -02/09/2020 TSH 0.727 -continue levothyroxine 112 mcg daily    Time spent: 10 minutes  Sherilyn Banker, PharmD Pharmacy Resident   07/28/2020 1:32 PM    Current Outpatient Medications:    acetaminophen (TYLENOL) 500 MG tablet, Take 1,000 mg by mouth every 6 (six) hours as needed (for pain)., Disp: , Rfl:    amLODipine (NORVASC) 5 MG tablet, Take 5 mg by mouth 2 (two) times daily., Disp: , Rfl:    apixaban (ELIQUIS) 5 MG TABS tablet, Take 1 tablet (5 mg total) by mouth 2 (two) times daily., Disp: 180 tablet, Rfl: 1   atorvastatin (LIPITOR) 20 MG tablet, Take 20 mg by mouth at bedtime. , Disp: , Rfl:    calcitRIOL (ROCALTROL) 0.25 MCG capsule, Take 0.25 mcg by mouth daily., Disp: , Rfl:    calcium carbonate (OSCAL) 1500 (600 Ca) MG TABS tablet, Take 600 mg of elemental calcium by mouth daily. , Disp: , Rfl:    Cholecalciferol (VITAMIN D3) 50000 units TABS, Take 50,000 Units by mouth every Friday. , Disp: , Rfl:    CINNAMON PO, Take 1,000 mg by mouth daily. , Disp: , Rfl:    cloNIDine (CATAPRES) 0.2 MG tablet, Take 1 tablet (0.2 mg total) by mouth 2 (two) times daily., Disp: 60 tablet, Rfl: 0   CRANBERRY CONCENTRATE PO, Take 1 capsule by mouth daily., Disp: , Rfl:    Dulaglutide 1.5 MG/0.5ML SOPN, Inject 1.5 mg into the skin every 7 (seven) days., Disp: , Rfl:    folic acid (FOLVITE) 741 MCG tablet, Take 800 mcg by mouth daily. , Disp: , Rfl:    furosemide (LASIX) 40 MG tablet, Take 1 tablet (40 mg total) by mouth daily., Disp: , Rfl:    KLOR-CON M20 20 MEQ tablet, TAKE 1 TABLET DAILY, Disp: 90 tablet, Rfl: 3   levothyroxine (SYNTHROID, LEVOTHROID) 112 MCG tablet, Take 112 mcg daily before breakfast by mouth., Disp: , Rfl:    Melatonin 10 MG TABS, Take 10 mg by mouth at bedtime., Disp: , Rfl:    metoprolol tartrate (LOPRESSOR) 25 MG tablet, Take 1 tablet (25 mg total) by mouth 2 (two) times daily., Disp: 180 tablet, Rfl: 2   vitamin C (ASCORBIC ACID) 500 MG tablet, Take 500 mg by mouth 2 (two) times daily., Disp: , Rfl:    DRUGS TO CAUTION IN HEART FAILURE  Drug or Class Mechanism  Analgesics NSAIDs COX-2  inhibitors Glucocorticoids  Sodium and water retention, increased systemic vascular resistance, decreased response to diuretics   Diabetes Medications Metformin Thiazolidinediones Rosiglitazone (Avandia) Pioglitazone (Actos) DPP4 Inhibitors Saxagliptin (Onglyza) Sitagliptin (Januvia)   Lactic acidosis Possible calcium channel blockade   Unknown  Antiarrhythmics Class I  Flecainide Disopyramide Class III Sotalol Other Dronedarone  Negative inotrope, proarrhythmic   Proarrhythmic, beta blockade  Negative inotrope  Antihypertensives Alpha Blockers Doxazosin Calcium Channel Blockers Diltiazem Verapamil Nifedipine Central Alpha Adrenergics Moxonidine Peripheral Vasodilators Minoxidil  Increases renin and aldosterone  Negative inotrope    Possible sympathetic withdrawal  Unknown  Anti-infective Itraconazole Amphotericin B  Negative inotrope Unknown  Hematologic  Anagrelide Cilostazol   Possible inhibition of PD IV Inhibition of PD III causing arrhythmias  Neurologic/Psychiatric Stimulants Anti-Seizure Drugs Carbamazepine Pregabalin Antidepressants Tricyclics Citalopram Parkinsons Bromocriptine Pergolide Pramipexole Antipsychotics Clozapine Antimigraine Ergotamine Methysergide Appetite suppressants Bipolar Lithium  Peripheral alpha and beta agonist activity  Negative inotrope and chronotrope Calcium channel blockade  Negative inotrope, proarrhythmic Dose-dependent QT prolongation  Excessive serotonin activity/valvular damage Excessive serotonin activity/valvular damage Unknown  IgE mediated hypersensitivy, calcium channel blockade  Excessive serotonin activity/valvular damage Excessive serotonin activity/valvular damage Valvular damage  Direct myofibrillar degeneration, adrenergic stimulation  Antimalarials Chloroquine Hydroxychloroquine Intracellular inhibition of lysosomal enzymes  Urologic Agents Alpha  Blockers Doxazosin Prazosin Tamsulosin Terazosin  Increased renin and aldosterone  Adapted from Page Carleene Overlie, et al. "Drugs That May Cause or Exacerbate Heart Failure: A Scientific Statement from the American Heart  Association." Circulation 2016; 134:e32-e69. DOI: 10.1161/CIR.0000000000000426   MEDICATION ADHERENCES TIPS AND STRATEGIES Taking medication as prescribed improves patient outcomes in heart failure (reduces hospitalizations, improves symptoms, increases survival) Side effects of medications can be managed by decreasing doses, switching agents, stopping drugs, or adding additional therapy. Please let someone in the [facility] know if you have having bothersome side effects so we can modify your regimen. Do not alter your medication regimen without talking to [name].  Medication reminders can help patients remember to take drugs on time. If you are missing or forgetting doses you can try linking behaviors, using pill boxes, or an electronic reminder like an alarm on your phone or an app. Some people can also get automated phone calls as medication reminders.

## 2020-07-28 NOTE — Patient Instructions (Signed)
Continue weighing daily and call for an overnight weight gain of > 2 pounds or a weekly weight gain of >5 pounds. 

## 2020-08-17 DIAGNOSIS — G4733 Obstructive sleep apnea (adult) (pediatric): Secondary | ICD-10-CM | POA: Diagnosis not present

## 2020-08-17 DIAGNOSIS — I482 Chronic atrial fibrillation, unspecified: Secondary | ICD-10-CM | POA: Diagnosis not present

## 2020-08-17 DIAGNOSIS — J9601 Acute respiratory failure with hypoxia: Secondary | ICD-10-CM | POA: Diagnosis not present

## 2020-08-17 DIAGNOSIS — I5032 Chronic diastolic (congestive) heart failure: Secondary | ICD-10-CM | POA: Diagnosis not present

## 2020-08-20 ENCOUNTER — Other Ambulatory Visit: Payer: Self-pay

## 2020-08-20 DIAGNOSIS — C50911 Malignant neoplasm of unspecified site of right female breast: Secondary | ICD-10-CM

## 2020-08-25 ENCOUNTER — Telehealth: Payer: Self-pay | Admitting: Cardiovascular Disease

## 2020-08-25 NOTE — Telephone Encounter (Signed)
Spoke to pt. She reports that she has been using her pulse ox at home and noticed her HR irregular ranging 70s-90s. Pt is on oxygen at home @ 1L/min and notes incr shortness of breath on minimal exertion and incr fatigue.   Pt with h/o persistent afib. Pt underwent successful cardioversion in October, 2020.  No recurrent atrial fibrillation since then.    Pt continues Lopressor 25mg  BID and Eliquis 5mg  BID without missed doses.   Pt was scheduled for 6 mo follow up in September.  R/s appt to see Dr. Fletcher Anon to available opening for tomorrow 08/26/20 at 9:40 AM.  Pt aware to arrive 15 min early.  Advised pt if s/s worsen, or becomes unstable prior to appt to call 911.  Pt voiced understanding.

## 2020-08-25 NOTE — Telephone Encounter (Signed)
Patient has a pulse ox and is getting irregular numbers from 60's -80's .    Patient does have a hx of afib and is not sure if this is what's going on or if this is normal.    Please advise

## 2020-08-25 NOTE — Telephone Encounter (Signed)
Patient also reports mild SOBE and mild fatigue .  She states she saw a commercial about afib and these symptoms were mentioned.

## 2020-08-26 ENCOUNTER — Encounter: Payer: Self-pay | Admitting: Cardiovascular Disease

## 2020-08-26 ENCOUNTER — Ambulatory Visit: Payer: BC Managed Care – PPO | Admitting: Cardiovascular Disease

## 2020-08-26 ENCOUNTER — Other Ambulatory Visit: Payer: Self-pay

## 2020-08-26 VITALS — BP 158/68 | HR 94 | Ht 64.0 in | Wt 354.0 lb

## 2020-08-26 DIAGNOSIS — I4819 Other persistent atrial fibrillation: Secondary | ICD-10-CM | POA: Diagnosis not present

## 2020-08-26 DIAGNOSIS — N189 Chronic kidney disease, unspecified: Secondary | ICD-10-CM

## 2020-08-26 DIAGNOSIS — Z9989 Dependence on other enabling machines and devices: Secondary | ICD-10-CM

## 2020-08-26 DIAGNOSIS — G4733 Obstructive sleep apnea (adult) (pediatric): Secondary | ICD-10-CM

## 2020-08-26 DIAGNOSIS — I10 Essential (primary) hypertension: Secondary | ICD-10-CM

## 2020-08-26 DIAGNOSIS — I5032 Chronic diastolic (congestive) heart failure: Secondary | ICD-10-CM

## 2020-08-26 DIAGNOSIS — Z86711 Personal history of pulmonary embolism: Secondary | ICD-10-CM

## 2020-08-26 MED ORDER — METOPROLOL TARTRATE 50 MG PO TABS: 50.0000 mg | ORAL_TABLET | Freq: Two times a day (BID) | ORAL | 1 refills | Status: DC

## 2020-08-26 NOTE — Progress Notes (Signed)
Cardiology Office Note   Date:  08/26/2020   ID:  Nicole Schmidt, DOB 01-22-1966, MRN [id number]  PCP:  Lenard Simmer, MD  Cardiologist:   Kathlyn Sacramento, MD   Chief Complaint  Patient presents with   Other    Irregular HR, increased sob and fatigue. Meds reviewed verbally with pt.      History of Present Illness: Nicole Schmidt is a 55 y.o. female who presents for for follow-up visit regarding atrial fibrillation and chronic diastolic heart failure. She has known history of breast cancer status post radiation, diabetes mellitus, essential hypertension, chronic kidney disease, thyroid disease, morbid obesity, sleep apnea on CPAP, anemia of chronic disease, gout, pulmonary embolism and chronic diastolic heart failure. She was hospitalized in September 2020 with A. fib with RVR.  She was diagnosed with community-acquired pneumonia which was treated with antibiotics.  She was started on metoprolol 50 mg twice daily for atrial fibrillation.  She was already on Eliquis for previous pulmonary embolism and this was continued.  Echocardiogram showed normal LV systolic function with diastolic dysfunction and moderate pulmonary hypertension.  She underwent successful cardioversion in October, 2020.   She was hospitalized in December of 2021 for acute respiratory distress due to heart failure and pneumonia.  Echocardiogram showed an EF of 65 to 70% with severe LVH.  Spironolactone was discontinued due to hyperkalemia. Metoprolol was decreased to 25 mg twice daily last year.  She recently noted irregular heartbeats but minimal symptoms with this.  She does have mild exertional dyspnea and fatigue.  No chest pain.  She does admit to not using her CPAP on a regular basis.  No significant palpitations overall.   Past Medical History:  Diagnosis Date   (HFpEF) heart failure with preserved ejection fraction ([place])    a. 10/2018 Echo: EF 60-65%, diast dysfxn, RVSP 50.7 mmHg, mildly dil LA.    Acquired hypothyroidism 02/09/2020   Anemia    Arthritis    Breast cancer ([place]) 2014   right breast cancer   Breast cancer of upper-inner quadrant of right female breast ([place])    Right breast invasive CA and DCIS , 7 mm T1,N0,M0. Er/PR pos, her 2 negative.  Margins 1 mm.   CHF (congestive heart failure) (HCC)    CKD (chronic kidney disease), stage III (HCC)    Diabetes mellitus type 2 in obese ([place]) 02/09/2020   Diabetes mellitus without complication ([place])    Dyslipidemia 02/09/2020   Essential hypertension    Gout    Hypothyroidism    Menopause    age 14   Morbid obesity ([place])    Persistent atrial fibrillation ([place])    a. Dx 10/2018 in setting of PNA. CHA2DS2VASc = 4-->Eliquis; b. 11/2018 s/p successful DCCV.   Personal history of radiation therapy    Pulmonary embolism ([place]) 10/2014   a. Chronic eliquis.   Thyroid goiter     Past Surgical History:  Procedure Laterality Date   BREAST BIOPSY Right 2014   breast ca   BREAST EXCISIONAL BIOPSY Right 07/19/2012   breast ca   BREAST SURGERY Right 2014   with sentinel node bx subareaolar duct excision   CARDIOVERSION N/A 11/28/2018   Procedure: CARDIOVERSION;  Surgeon: Minna Merritts, MD;  Location: [facility] ORS;  Service: Cardiovascular;  Laterality: N/A;   COLONOSCOPY WITH PROPOFOL N/A 01/30/2017   Procedure: COLONOSCOPY WITH PROPOFOL;  Surgeon: Christene Lye, MD;  Location: [facility] ENDOSCOPY;  Service: Endoscopy;  Laterality: N/A;  Current Outpatient Medications  Medication Sig Dispense Refill   acetaminophen (TYLENOL) 500 MG tablet Take 1,000 mg by mouth every 6 (six) hours as needed (for pain).     amLODipine (NORVASC) 5 MG tablet Take 5 mg by mouth 2 (two) times daily.     apixaban (ELIQUIS) 5 MG TABS tablet Take 1 tablet (5 mg total) by mouth 2 (two) times daily. 180 tablet 1   atorvastatin (LIPITOR) 20 MG tablet Take 20 mg by mouth at bedtime.      calcitRIOL (ROCALTROL) 0.25 MCG capsule Take 0.25 mcg by mouth  daily.     calcium carbonate (OSCAL) 1500 (600 Ca) MG TABS tablet Take 600 mg of elemental calcium by mouth daily.      Cholecalciferol (VITAMIN D3) 50000 units TABS Take 50,000 Units by mouth every Friday.      CINNAMON PO Take 1,000 mg by mouth daily.      cloNIDine (CATAPRES) 0.2 MG tablet Take 1 tablet (0.2 mg total) by mouth 2 (two) times daily. 60 tablet 0   CRANBERRY CONCENTRATE PO Take 1 capsule by mouth daily.     Dulaglutide 1.5 MG/0.5ML SOPN Inject 1.5 mg into the skin every 7 (seven) days.     folic acid (FOLVITE) 093 MCG tablet Take 800 mcg by mouth daily.      furosemide (LASIX) 40 MG tablet Take 1 tablet (40 mg total) by mouth daily.     KLOR-CON M20 20 MEQ tablet TAKE 1 TABLET DAILY 90 tablet 3   levothyroxine (SYNTHROID, LEVOTHROID) 112 MCG tablet Take 112 mcg daily before breakfast by mouth.     Melatonin 10 MG TABS Take 10 mg by mouth at bedtime.     metoprolol tartrate (LOPRESSOR) 25 MG tablet Take 1 tablet (25 mg total) by mouth 2 (two) times daily. 180 tablet 2   vitamin C (ASCORBIC ACID) 500 MG tablet Take 500 mg by mouth 2 (two) times daily.     No current facility-administered medications for this visit.    Allergies:   Patient has no known allergies.    Social History:  The patient  reports that she has never smoked. She has never used smokeless tobacco. She reports that she does not drink alcohol and does not use drugs.   Family History:  The patient's family history includes Atrial fibrillation in her mother; Colon cancer (age of onset: 21) in her cousin; Diabetes in her father; Heart attack in her mother; Heart failure in her mother; Hypertension in her father and mother; Parkinson's disease in her father; Rheum arthritis in her mother.    ROS:  Please see the history of present illness.   Otherwise, review of systems are positive for none.   All other systems are reviewed and negative.    PHYSICAL EXAM: VS:  BP (!) 158/68 (BP Location: Left Wrist, Patient  Position: Sitting, Cuff Size: Large)   Pulse 94   Ht 5\' 4"  (1.626 m)   Wt (!) 354 lb (160.6 kg)   LMP 07/17/2012   SpO2 98%   BMI 60.76 kg/m  , BMI Body mass index is 60.76 kg/m. GEN: Well nourished, well developed, in no acute distress  HEENT: normal  Neck: no JVD, carotid bruits, or masses Cardiac: Irregularly irregular; no murmurs, rubs, or gallops, mild edema  Respiratory:  clear to auscultation bilaterally, normal work of breathing GI: soft, nontender, nondistended, + BS MS: no deformity or atrophy  Skin: warm and dry, no rash Neuro:  Strength and  sensation are intact Psych: euthymic mood, full affect   EKG:  EKG is ordered today. The ekg ordered today demonstrates atrial fibrillation with ventricular rate of 97 bpm.  Recent Labs: 02/09/2020: TSH 0.727 02/17/2020: Magnesium 2.3 02/20/2020: ALT 26; B Natriuretic Peptide 210.7 02/27/2020: BUN 40; Creatinine, Ser 1.83; Hemoglobin 9.9; Platelets 232; Potassium 5.1; Sodium 144    Lipid Panel    Component Value Date/Time   CHOL 122 02/09/2020 1340   TRIG 153 (H) 02/09/2020 1340   HDL 49 02/09/2020 1340   CHOLHDL 2.5 02/09/2020 1340   VLDL 31 02/09/2020 1340   LDLCALC 42 02/09/2020 1340      Wt Readings from Last 3 Encounters:  08/26/20 (!) 354 lb (160.6 kg)  07/28/20 (!) 356 lb 2 oz (161.5 kg)  06/17/20 (!) 358 lb 12.8 oz (162.8 kg)        No flowsheet data found.    ASSESSMENT AND PLAN:  1. Persistent atrial fibrillation: She is noted to be in atrial fibrillation today but fortunately does not appear to be very symptomatic from this.  She is on long-term anticoagulation with Eliquis.  I am concerned that it would be difficult to maintain her in sinus rhythm given her morbid obesity and sleep apnea.  Given that she has minimal symptoms related to atrial fibrillation, it is reasonable to continue with rate control and anticoagulation.  I elected to increase metoprolol back to 50 mg twice daily.  Most recent  echocardiogram late last year showed normal LV systolic function. If she develops symptoms related to atrial fibrillation, we can always consider cardioversion.  2. Chronic diastolic heart failure: She appears to be euvolemic on furosemide 40 mg once daily.  3.  Essential hypertension: Blood pressure is elevated today.  Metoprolol was increased.  4. Morbid obesity: She is trying to lose weight but has not been successful.  5. History of pulmonary embolism: On anticoagulation with Eliquis.  6.  Obstructive sleep apnea: She does admit of not using CPAP on a regular basis.  I suspect that this might be the culprit for triggering atrial fibrillation.  I discussed with her the importance of compliance with CPAP.  7.  Chronic kidney disease: Most recent labs showed a creatinine of 1.83.  Followed by nephrology.    Disposition:   FU with me in 3 months  Signed,  Kathlyn Sacramento, MD  08/26/2020 9:56 AM    [place]

## 2020-08-26 NOTE — Patient Instructions (Signed)
Medication Instructions:  Your physician has recommended you make the following change in your medication:   INCREASE Metoprolol to 50 mg twice a day. An Rx has been sent to your pharmacy.   *If you need a refill on your cardiac medications before your next appointment, please call your pharmacy*   Lab Work: None ordered  If you have labs (blood work) drawn today and your tests are completely normal, you will receive your results only by: [facility] (if you have MyChart) OR A paper copy in the mail If you have any lab test that is abnormal or we need to change your treatment, we will call you to review the results.   Testing/Procedures: None ordered   Follow-Up: At [facility], you and your health needs are our priority.  As part of our continuing mission to provide you with exceptional heart care, we have created designated Provider Care Teams.  These Care Teams include your primary Cardiologist (physician) and Advanced Practice Providers (APPs -  Physician Assistants and Nurse Practitioners) who all work together to provide you with the care you need, when you need it.  We recommend signing up for the patient portal called "MyChart".  Sign up information is provided on this After Visit Summary.  MyChart is used to connect with patients for Virtual Visits (Telemedicine).  Patients are able to view lab/test results, encounter notes, upcoming appointments, etc.  Non-urgent messages can be sent to your provider as well.   To learn more about what you can do with MyChart, go to [email or web address].    Your next appointment:   3 month(s)  The format for your next appointment:   In Person  Provider:   You may see Kathlyn Sacramento, MD or one of the following Advanced Practice Providers on your designated Care Team:   Murray Hodgkins, NP Christell Faith, PA-C Marrianne Mood, PA-C Cadence Kathlen Mody, [place]   Other Instructions: N/A

## 2020-08-31 DIAGNOSIS — E1165 Type 2 diabetes mellitus with hyperglycemia: Secondary | ICD-10-CM | POA: Diagnosis not present

## 2020-08-31 DIAGNOSIS — E785 Hyperlipidemia, unspecified: Secondary | ICD-10-CM | POA: Diagnosis not present

## 2020-08-31 DIAGNOSIS — E559 Vitamin D deficiency, unspecified: Secondary | ICD-10-CM | POA: Diagnosis not present

## 2020-08-31 DIAGNOSIS — E063 Autoimmune thyroiditis: Secondary | ICD-10-CM | POA: Diagnosis not present

## 2020-09-01 ENCOUNTER — Telehealth: Payer: Self-pay | Admitting: Cardiovascular Disease

## 2020-09-01 ENCOUNTER — Telehealth: Payer: Self-pay | Admitting: Pulmonary Disease

## 2020-09-01 NOTE — Telephone Encounter (Signed)
Spoke to [place], PT with [facility].  Patient recently transition[facility]. Patient's personal goal is to eliminate o2 usage.  Patient has decreased O2 to 1L with exertion, however patient is currently in A-fib and she is more comfortable at 2L with activity.  Broadus John explained to patient the importance of not discontinuing O2 while in a-fib.   Broadus John would like Dr. Juanetta Gosling intake  Dr. Halford Chessman, please advise. Thanks

## 2020-09-01 NOTE — Telephone Encounter (Signed)
Please call to discuss exercises patient can do , based on her diagnosis of afib, with physical therapist at [facility].

## 2020-09-01 NOTE — Telephone Encounter (Signed)
Returned the call to [place] and spoke with Marcille Blanco, PT.  Joe made aware of Dr. Tyrell Antonio response and recommendation with verbalized understanding.

## 2020-09-01 NOTE — Telephone Encounter (Signed)
Msg fwd to Dr. Arida to advise. °

## 2020-09-01 NOTE — Telephone Encounter (Signed)
No restriction on exercise but should monitor heart rate during exercise and keep heart rate below 130 bpm.

## 2020-09-02 NOTE — Telephone Encounter (Signed)
If her SpO2 is less than 88% with activity on room air, then she should continue using supplemental oxygen.  Ideally goal should be to keep SpO2 > 90% if she is using supplemental oxygen.

## 2020-09-02 NOTE — Telephone Encounter (Signed)
Nicole Schmidt, [facility] with [facility] is aware of below message and voiced his understanding.  Nothing further needed at this time.

## 2020-09-02 NOTE — Telephone Encounter (Signed)
Lm for Broadus John PT.

## 2020-09-07 DIAGNOSIS — E1165 Type 2 diabetes mellitus with hyperglycemia: Secondary | ICD-10-CM | POA: Diagnosis not present

## 2020-09-07 DIAGNOSIS — I2699 Other pulmonary embolism without acute cor pulmonale: Secondary | ICD-10-CM | POA: Diagnosis not present

## 2020-09-07 DIAGNOSIS — C50819 Malignant neoplasm of overlapping sites of unspecified female breast: Secondary | ICD-10-CM | POA: Diagnosis not present

## 2020-09-07 DIAGNOSIS — I10 Essential (primary) hypertension: Secondary | ICD-10-CM | POA: Diagnosis not present

## 2020-09-07 DIAGNOSIS — N184 Chronic kidney disease, stage 4 (severe): Secondary | ICD-10-CM | POA: Diagnosis not present

## 2020-09-07 DIAGNOSIS — Z6841 Body Mass Index (BMI) 40.0 and over, adult: Secondary | ICD-10-CM | POA: Diagnosis not present

## 2020-09-07 DIAGNOSIS — E559 Vitamin D deficiency, unspecified: Secondary | ICD-10-CM | POA: Diagnosis not present

## 2020-09-07 DIAGNOSIS — E041 Nontoxic single thyroid nodule: Secondary | ICD-10-CM | POA: Diagnosis not present

## 2020-09-07 DIAGNOSIS — E669 Obesity, unspecified: Secondary | ICD-10-CM | POA: Diagnosis not present

## 2020-09-07 DIAGNOSIS — I5043 Acute on chronic combined systolic (congestive) and diastolic (congestive) heart failure: Secondary | ICD-10-CM | POA: Diagnosis not present

## 2020-09-07 DIAGNOSIS — D352 Benign neoplasm of pituitary gland: Secondary | ICD-10-CM | POA: Diagnosis not present

## 2020-09-16 DIAGNOSIS — R809 Proteinuria, unspecified: Secondary | ICD-10-CM | POA: Diagnosis not present

## 2020-09-16 DIAGNOSIS — E1122 Type 2 diabetes mellitus with diabetic chronic kidney disease: Secondary | ICD-10-CM | POA: Diagnosis not present

## 2020-09-16 DIAGNOSIS — I10 Essential (primary) hypertension: Secondary | ICD-10-CM | POA: Diagnosis not present

## 2020-09-16 DIAGNOSIS — N184 Chronic kidney disease, stage 4 (severe): Secondary | ICD-10-CM | POA: Diagnosis not present

## 2020-09-17 DIAGNOSIS — I482 Chronic atrial fibrillation, unspecified: Secondary | ICD-10-CM | POA: Diagnosis not present

## 2020-09-17 DIAGNOSIS — I5032 Chronic diastolic (congestive) heart failure: Secondary | ICD-10-CM | POA: Diagnosis not present

## 2020-09-17 DIAGNOSIS — G4733 Obstructive sleep apnea (adult) (pediatric): Secondary | ICD-10-CM | POA: Diagnosis not present

## 2020-09-17 DIAGNOSIS — J9601 Acute respiratory failure with hypoxia: Secondary | ICD-10-CM | POA: Diagnosis not present

## 2020-09-27 DIAGNOSIS — G4733 Obstructive sleep apnea (adult) (pediatric): Secondary | ICD-10-CM | POA: Diagnosis not present

## 2020-10-18 ENCOUNTER — Other Ambulatory Visit: Payer: Self-pay | Admitting: Family

## 2020-10-18 DIAGNOSIS — J9601 Acute respiratory failure with hypoxia: Secondary | ICD-10-CM | POA: Diagnosis not present

## 2020-10-18 DIAGNOSIS — I482 Chronic atrial fibrillation, unspecified: Secondary | ICD-10-CM | POA: Diagnosis not present

## 2020-10-18 DIAGNOSIS — G4733 Obstructive sleep apnea (adult) (pediatric): Secondary | ICD-10-CM | POA: Diagnosis not present

## 2020-10-18 DIAGNOSIS — I5032 Chronic diastolic (congestive) heart failure: Secondary | ICD-10-CM | POA: Diagnosis not present

## 2020-11-08 DIAGNOSIS — C50819 Malignant neoplasm of overlapping sites of unspecified female breast: Secondary | ICD-10-CM | POA: Diagnosis not present

## 2020-11-08 DIAGNOSIS — I5043 Acute on chronic combined systolic (congestive) and diastolic (congestive) heart failure: Secondary | ICD-10-CM | POA: Diagnosis not present

## 2020-11-08 DIAGNOSIS — E559 Vitamin D deficiency, unspecified: Secondary | ICD-10-CM | POA: Diagnosis not present

## 2020-11-08 DIAGNOSIS — E1165 Type 2 diabetes mellitus with hyperglycemia: Secondary | ICD-10-CM | POA: Diagnosis not present

## 2020-11-08 DIAGNOSIS — E785 Hyperlipidemia, unspecified: Secondary | ICD-10-CM | POA: Diagnosis not present

## 2020-11-08 DIAGNOSIS — N184 Chronic kidney disease, stage 4 (severe): Secondary | ICD-10-CM | POA: Diagnosis not present

## 2020-11-08 DIAGNOSIS — D352 Benign neoplasm of pituitary gland: Secondary | ICD-10-CM | POA: Diagnosis not present

## 2020-11-11 ENCOUNTER — Ambulatory Visit: Payer: BC Managed Care – PPO | Admitting: Cardiovascular Disease

## 2020-11-15 DIAGNOSIS — D352 Benign neoplasm of pituitary gland: Secondary | ICD-10-CM | POA: Diagnosis not present

## 2020-11-15 DIAGNOSIS — I482 Chronic atrial fibrillation, unspecified: Secondary | ICD-10-CM | POA: Diagnosis not present

## 2020-11-15 DIAGNOSIS — I10 Essential (primary) hypertension: Secondary | ICD-10-CM | POA: Diagnosis not present

## 2020-11-15 DIAGNOSIS — E1165 Type 2 diabetes mellitus with hyperglycemia: Secondary | ICD-10-CM | POA: Diagnosis not present

## 2020-11-15 DIAGNOSIS — I2699 Other pulmonary embolism without acute cor pulmonale: Secondary | ICD-10-CM | POA: Diagnosis not present

## 2020-11-15 DIAGNOSIS — E559 Vitamin D deficiency, unspecified: Secondary | ICD-10-CM | POA: Diagnosis not present

## 2020-11-15 DIAGNOSIS — Z23 Encounter for immunization: Secondary | ICD-10-CM | POA: Diagnosis not present

## 2020-11-15 DIAGNOSIS — C50819 Malignant neoplasm of overlapping sites of unspecified female breast: Secondary | ICD-10-CM | POA: Diagnosis not present

## 2020-11-15 DIAGNOSIS — E039 Hypothyroidism, unspecified: Secondary | ICD-10-CM | POA: Diagnosis not present

## 2020-11-15 DIAGNOSIS — I5043 Acute on chronic combined systolic (congestive) and diastolic (congestive) heart failure: Secondary | ICD-10-CM | POA: Diagnosis not present

## 2020-11-15 DIAGNOSIS — E063 Autoimmune thyroiditis: Secondary | ICD-10-CM | POA: Diagnosis not present

## 2020-11-17 DIAGNOSIS — G4733 Obstructive sleep apnea (adult) (pediatric): Secondary | ICD-10-CM | POA: Diagnosis not present

## 2020-11-17 DIAGNOSIS — I5032 Chronic diastolic (congestive) heart failure: Secondary | ICD-10-CM | POA: Diagnosis not present

## 2020-11-17 DIAGNOSIS — I482 Chronic atrial fibrillation, unspecified: Secondary | ICD-10-CM | POA: Diagnosis not present

## 2020-11-17 DIAGNOSIS — J9601 Acute respiratory failure with hypoxia: Secondary | ICD-10-CM | POA: Diagnosis not present

## 2020-11-22 ENCOUNTER — Ambulatory Visit
Admission: RE | Admit: 2020-11-22 | Discharge: 2020-11-22 | Disposition: A | Discharge status: Home / Self Care | Payer: BC Managed Care – PPO | Source: Ambulatory Visit | Attending: Oncology | Admitting: Oncology

## 2020-11-22 ENCOUNTER — Other Ambulatory Visit: Payer: Self-pay

## 2020-11-22 DIAGNOSIS — Z1231 Encounter for screening mammogram for malignant neoplasm of breast: Secondary | ICD-10-CM | POA: Insufficient documentation

## 2020-11-22 DIAGNOSIS — Z170 Estrogen receptor positive status [ER+]: Secondary | ICD-10-CM | POA: Diagnosis not present

## 2020-11-22 DIAGNOSIS — C50911 Malignant neoplasm of unspecified site of right female breast: Secondary | ICD-10-CM | POA: Diagnosis not present

## 2020-11-30 ENCOUNTER — Ambulatory Visit: Payer: BC Managed Care – PPO | Admitting: Cardiovascular Disease

## 2020-12-05 NOTE — Progress Notes (Signed)
Cardiology Office Note    Date:  12/06/2020   ID:  [name], DOB 1965-03-18, MRN [id number]  PCP:  Lenard Simmer, MD  Cardiologist:  Kathlyn Sacramento, MD  Electrophysiologist:  None   Chief Complaint: Follow up  History of Present Illness:   Nicole Schmidt is a 55 y.o. female with history of persistent A. fib status post DCCV in 11/2018, HFpEF, chronic hypoxic respiratory failure on supplemental oxygen since late 2021, breast cancer s/p radiation, DM2, HTN, CKD stage IV, history of PE on chronic OAC, anemia of chronic disease, hypothyroidism, morbid obesity, sleep apnea on CPAP, and gout who presents for follow-up of A. fib.  She was admitted to the hospital in 10/2018 with A. fib with RVR.  She was diagnosed with CAP and treated with antibiotics.  She was initiated on metoprolol for rate control strategy.  She was already on Eliquis given history of PE, and this was continued.  Echo showed normal LV systolic function with diastolic dysfunction and moderate pulmonary hypertension.  She underwent successful DCCV in 11/2018.  She was admitted to the hospital in 01/2020 with acute respiratory distress due to heart failure and pneumonia.  Echo showed an EF of 65 to 70% with severe LVH.  Spironolactone was discontinued secondary to hyperkalemia.  She was last seen in the office in 08/2020 noting irregular heartbeats, though was minimally symptomatic.  She reported mild exertional dyspnea and fatigue without pain.  She had not been using her CPAP on a regular basis.  EKG in the office at that time showed A. fib with a ventricular rate of 97 bpm.  Continued rhythm control strategy was felt to be difficult given her morbid obesity and underlying sleep apnea without CPAP adherence.  Given minimal symptoms related to A. fib, rate control strategy was pursued, and anticoagulation was continued.  She comes in doing reasonably well from a cardiac perspective.  She feels about the same as she did in  08/2020 with stable mild exertional shortness of breath and fatigue.  She remains on stable supplemental oxygen via nasal cannula.  No chest pain, palpitations, dizziness, presyncope, or syncope.  She is tolerating all medications including anticoagulation without issues.  She reports she is now adherent to CPAP.   Labs independently reviewed: 02/2020 - Hgb 9.9, PLT 232, BUN 40, serum creatinine 1.83, potassium 5.1, albumin 3.0, AST/ALT normal, magnesium 2.3 01/2020 - A1c 6.4, TSH normal, TC 122, TG 153, HDL 49, LDL 42  Past Medical History:  Diagnosis Date   (HFpEF) heart failure with preserved ejection fraction ([place])    a. 10/2018 Echo: EF 60-65%, diast dysfxn, RVSP 50.7 mmHg, mildly dil LA.   Acquired hypothyroidism 02/09/2020   Anemia    Arthritis    Breast cancer ([place]) 2014   right breast cancer   Breast cancer of upper-inner quadrant of right female breast ([place])    Right breast invasive CA and DCIS , 7 mm T1,N0,M0. Er/PR pos, her 2 negative.  Margins 1 mm.   CHF (congestive heart failure) (HCC)    CKD (chronic kidney disease), stage III (HCC)    Diabetes mellitus type 2 in obese ([place]) 02/09/2020   Diabetes mellitus without complication ([place])    Dyslipidemia 02/09/2020   Essential hypertension    Gout    Hypothyroidism    Menopause    age 31   Morbid obesity ([place])    Persistent atrial fibrillation ([place])    a. Dx 10/2018 in setting of PNA.  CHA2DS2VASc = 4-->Eliquis; b. 11/2018 s/p successful DCCV.   Personal history of radiation therapy    Pulmonary embolism ([place]) 10/2014   a. Chronic eliquis.   Thyroid goiter     Past Surgical History:  Procedure Laterality Date   BREAST BIOPSY Right 2014   breast ca   BREAST EXCISIONAL BIOPSY Right 07/19/2012   breast ca   BREAST SURGERY Right 2014   with sentinel node bx subareaolar duct excision   CARDIOVERSION N/A 11/28/2018   Procedure: CARDIOVERSION;  Surgeon: Minna Merritts, MD;  Location: [facility] ORS;  Service: Cardiovascular;   Laterality: N/A;   COLONOSCOPY WITH PROPOFOL N/A 01/30/2017   Procedure: COLONOSCOPY WITH PROPOFOL;  Surgeon: Christene Lye, MD;  Location: [facility] ENDOSCOPY;  Service: Endoscopy;  Laterality: N/A;    Current Medications: Current Meds  Medication Sig   acetaminophen (TYLENOL) 500 MG tablet Take 1,000 mg by mouth every 6 (six) hours as needed (for pain).   amLODipine (NORVASC) 5 MG tablet Take 5 mg by mouth 2 (two) times daily.   apixaban (ELIQUIS) 5 MG TABS tablet Take 1 tablet (5 mg total) by mouth 2 (two) times daily.   atorvastatin (LIPITOR) 20 MG tablet Take 20 mg by mouth at bedtime.    calcitRIOL (ROCALTROL) 0.25 MCG capsule Take 0.25 mcg by mouth daily.   calcium carbonate (OSCAL) 1500 (600 Ca) MG TABS tablet Take 600 mg of elemental calcium by mouth daily.    Cholecalciferol (VITAMIN D3) 50000 units TABS Take 50,000 Units by mouth every Friday.    CINNAMON PO Take 1,000 mg by mouth daily.    cloNIDine (CATAPRES) 0.2 MG tablet Take 1 tablet (0.2 mg total) by mouth 2 (two) times daily.   CRANBERRY CONCENTRATE PO Take 1 capsule by mouth daily.   Dulaglutide 1.5 MG/0.5ML SOPN Inject 1.5 mg into the skin every 7 (seven) days.   folic acid (FOLVITE) 130 MCG tablet Take 800 mcg by mouth daily.    furosemide (LASIX) 40 MG tablet Take 1 tablet (40 mg total) by mouth daily.   KLOR-CON M20 20 MEQ tablet TAKE 1 TABLET DAILY   levothyroxine (SYNTHROID, LEVOTHROID) 112 MCG tablet Take 112 mcg daily before breakfast by mouth.   Melatonin 10 MG TABS Take 10 mg by mouth at bedtime.   vitamin Schmidt (ASCORBIC ACID) 500 MG tablet Take 500 mg by mouth 2 (two) times daily.   [DISCONTINUED] metoprolol tartrate (LOPRESSOR) 50 MG tablet Take 1 tablet (50 mg total) by mouth 2 (two) times daily.    Allergies:   Patient has no known allergies.   Social History   Socioeconomic History   Marital status: Single    Spouse name: Not on file   Number of children: Not on file   Years of education: Not on  file   Highest education level: Not on file  Occupational History   Occupation: retired  Tobacco Use   Smoking status: Never   Smokeless tobacco: Never  Vaping Use   Vaping Use: Never used  Substance and Sexual Activity   Alcohol use: No    Alcohol/week: 0.0 standard drinks   Drug use: No   Sexual activity: Not on file  Other Topics Concern   Not on file  Social History Narrative   Lives in [place] by herself.  Does not routinely exercise.  Works full-time - on her feet all day at work.   Social Determinants of Health   Financial Resource Strain: Not on file  Food Insecurity: Not  on file  Transportation Needs: Not on file  Physical Activity: Not on file  Stress: Not on file  Social Connections: Not on file     Family History:  The patient's family history includes Atrial fibrillation in her mother; Colon cancer (age of onset: 63) in her cousin; Diabetes in her father; Heart attack in her mother; Heart failure in her mother; Hypertension in her father and mother; Parkinson's disease in her father; Rheum arthritis in her mother. There is no history of Breast cancer.  ROS:   Review of Systems  Constitutional:  Positive for malaise/fatigue. Negative for chills, diaphoresis, fever and weight loss.  HENT:  Negative for congestion.   Eyes:  Negative for discharge and redness.  Respiratory:  Positive for shortness of breath. Negative for cough, sputum production and wheezing.   Cardiovascular:  Negative for chest pain, palpitations, orthopnea, claudication, leg swelling and PND.  Gastrointestinal:  Negative for abdominal pain, blood in stool, heartburn, melena, nausea and vomiting.  Musculoskeletal:  Negative for falls and myalgias.  Skin:  Negative for rash.  Neurological:  Negative for dizziness, tingling, tremors, sensory change, speech change, focal weakness, loss of consciousness and weakness.  Endo/Heme/Allergies:  Does not bruise/bleed easily.  Psychiatric/Behavioral:   Negative for substance abuse. The patient is not nervous/anxious.   All other systems reviewed and are negative.   EKGs/Labs/Other Studies Reviewed:    Studies reviewed were summarized above. The additional studies were reviewed today:  2D echo 01/2020: 1. Left ventricular ejection fraction, by estimation, is 65 to 70%. The  left ventricle has normal function. Left ventricular endocardial border  not optimally defined to evaluate regional wall motion. There is severe  left ventricular hypertrophy. Left  ventricular diastolic parameters are consistent with Grade II diastolic  dysfunction (pseudonormalization). Elevated left atrial pressure.   2. Right ventricular systolic function is normal. The right ventricular  size is normal.   3. Left atrial size was moderately dilated.   4. Right atrial size was mildly dilated.   5. The mitral valve is grossly normal. No evidence of mitral valve  regurgitation.   6. The aortic valve was not well visualized. Aortic valve regurgitation  is not visualized. No aortic stenosis is present. __________  2D echo 10/2018: 1. The left ventricle has normal systolic function with an ejection  fraction of 60-65%. The cavity size was normal. There is moderately  increased left ventricular wall thickness. Left ventricular diastolic  Doppler parameters are consistent with  pseudonormalization.   2. The right ventricle has normal systolic function. The cavity was  normal. There is no increase in right ventricular wall thickness. Right  ventricular systolic pressure is moderately elevated with an estimated  pressure of 50.7 mmHg.   3. Left atrial size was mildly dilated.   4. Small pericardial effusion. __________  2D echo 11/2017: - Left ventricle: Wall thickness was increased in a pattern of    moderate LVH. Systolic function was normal. The estimated    ejection fraction was in the range of 55% to 60%. Doppler    parameters are consistent with abnormal  left ventricular    relaxation (grade 1 diastolic dysfunction).  - Aortic valve: Valve area (Vmax): 2.8 cm^2.  - Left atrium: The atrium was mildly dilated. __________  2D echo 10/2014: - Left ventricle: The cavity size was normal. Wall thickness was    increased in a pattern of severe LVH. Systolic function was    normal. The estimated ejection fraction was in the  range of 55%    to 60%.  - Mitral valve: There was mild regurgitation.  - Left atrium: The atrium was mildly dilated.  - Right ventricle: The cavity size was moderately dilated.  - Right atrium: The atrium was mildly dilated.  - Pulmonary arteries: PA peak pressure: 54 mm Hg (S).  - Pericardium, extracardiac: A trivial pericardial effusion was    identified.    EKG:  EKG is ordered today.  The EKG ordered today demonstrates A. fib, 84 bpm, possible prior septal infarct, poor R wave progression along the precordial leads, no acute ST-T changes, when compared to prior tracings no significant changes found  Recent Labs: 02/09/2020: TSH 0.727 02/17/2020: Magnesium 2.3 02/20/2020: ALT 26; B Natriuretic Peptide 210.7 02/27/2020: BUN 40; Creatinine, Ser 1.83; Hemoglobin 9.9; Platelets 232; Potassium 5.1; Sodium 144  Recent Lipid Panel    Component Value Date/Time   CHOL 122 02/09/2020 1340   TRIG 153 (H) 02/09/2020 1340   HDL 49 02/09/2020 1340   CHOLHDL 2.5 02/09/2020 1340   VLDL 31 02/09/2020 1340   LDLCALC 42 02/09/2020 1340    PHYSICAL EXAM:    VS:  BP 120/70 (BP Location: Left Arm, Patient Position: Sitting, Cuff Size: Large)   Pulse 84   Ht 5\' 4"  (1.626 m)   Wt (!) 354 lb 4 oz (160.7 kg)   LMP 07/17/2012   SpO2 97%   BMI 60.81 kg/m   BMI: Body mass index is 60.81 kg/m.  Physical Exam Vitals reviewed.  Constitutional:      Appearance: She is well-developed.  HENT:     Head: Normocephalic and atraumatic.  Eyes:     General:        Right eye: No discharge.        Left eye: No discharge.  Neck:      Vascular: No JVD.  Cardiovascular:     Rate and Rhythm: Normal rate. Rhythm irregularly irregular.     Pulses:          Posterior tibial pulses are 2+ on the right side and 2+ on the left side.     Heart sounds: Normal heart sounds, S1 normal and S2 normal. Heart sounds not distant. No midsystolic click and no opening snap. No murmur heard.   No friction rub.  Pulmonary:     Effort: Pulmonary effort is normal. No respiratory distress.     Breath sounds: Normal breath sounds. No decreased breath sounds, wheezing or rales.     Comments: Supplemental oxygen via nasal cannula noted Chest:     Chest wall: No tenderness.  Abdominal:     General: There is no distension.     Palpations: Abdomen is soft.     Tenderness: There is no abdominal tenderness.  Musculoskeletal:     Cervical back: Normal range of motion.     Right lower leg: No edema.     Left lower leg: No edema.  Skin:    General: Skin is warm and dry.     Nails: There is no clubbing.  Neurological:     Mental Status: She is alert and oriented to person, place, and time.  Psychiatric:        Speech: Speech normal.        Behavior: Behavior normal.        Thought Content: Thought content normal.        Judgment: Judgment normal.    Wt Readings from Last 3 Encounters:  12/06/20 (!) 354 lb  4 oz (160.7 kg)  08/26/20 (!) 354 lb (160.6 kg)  07/28/20 (!) 356 lb 2 oz (161.5 kg)     ASSESSMENT & PLAN:   Persistent Afib: She remains in rate controlled A. fib and is overall minimally symptomatic.  I do suspect her chronic stable exertional shortness of breath is multifactorial including underlying pulmonary disease with chronic hypoxic respiratory failure, HFpEF, morbid obesity, physical deconditioning, and a possible component of A. Fib.  Continue rate control strategy with recommendation to escalate Lopressor to 75 mg twice daily in an effort to increase rate control.  She will otherwise continue Eliquis 5 mg twice daily as she does  not meet reduced dosing criteria.  CHA2DS2-VASc at least 4 (CHF, HTN, vascular disease, sex category).  No symptoms concerning for bleeding.  Recent labs stable as outlined above.  Should she become symptomatic from her A. fib, rhythm control strategy could be pursued down the road, though the likelihood of maintaining a long-term sinus rhythm is decreased given morbid obesity, chronic hypoxic respiratory failure, and underlying sleep apnea.  Not a candidate for ablation given the above.  HFpEF: She appears euvolemic and well compensated on Lasix 40 mg daily.  HTN: Blood pressure is well controlled in the office today.  Continue current medical therapy.  History of PE: On anticoagulation with Eliquis.  CKD stage IV: Most recent SCr 1.83.  Followed by nephrology.   Morbid obesity with OSA: Continued weight loss is encouraged through heart healthy diet and regular exercise.  She is now adherent to CPAP.  Disposition: F/u with Dr. Fletcher Anon or an APP in 4 months.   Medication Adjustments/Labs and Tests Ordered: Current medicines are reviewed at length with the patient today.  Concerns regarding medicines are outlined above. Medication changes, Labs and Tests ordered today are summarized above and listed in the Patient Instructions accessible in Encounters.   Signed, Christell Faith, PA-Schmidt 12/06/2020 12:07 PM     [place] [address] [place] [address] [place], [place] [address] [phone]

## 2020-12-06 ENCOUNTER — Other Ambulatory Visit: Payer: Self-pay

## 2020-12-06 ENCOUNTER — Ambulatory Visit: Payer: BC Managed Care – PPO | Admitting: Physician Assistant

## 2020-12-06 ENCOUNTER — Encounter: Payer: Self-pay | Admitting: Physician Assistant

## 2020-12-06 VITALS — BP 120/70 | HR 84 | Ht 64.0 in | Wt 354.2 lb

## 2020-12-06 DIAGNOSIS — I4819 Other persistent atrial fibrillation: Secondary | ICD-10-CM

## 2020-12-06 DIAGNOSIS — I5032 Chronic diastolic (congestive) heart failure: Secondary | ICD-10-CM

## 2020-12-06 DIAGNOSIS — Z86711 Personal history of pulmonary embolism: Secondary | ICD-10-CM | POA: Diagnosis not present

## 2020-12-06 DIAGNOSIS — I10 Essential (primary) hypertension: Secondary | ICD-10-CM | POA: Diagnosis not present

## 2020-12-06 DIAGNOSIS — G4733 Obstructive sleep apnea (adult) (pediatric): Secondary | ICD-10-CM

## 2020-12-06 DIAGNOSIS — Z9989 Dependence on other enabling machines and devices: Secondary | ICD-10-CM

## 2020-12-06 DIAGNOSIS — N184 Chronic kidney disease, stage 4 (severe): Secondary | ICD-10-CM

## 2020-12-06 MED ORDER — METOPROLOL TARTRATE 50 MG PO TABS
75.0000 mg | ORAL_TABLET | Freq: Two times a day (BID) | ORAL | 0 refills | Status: DC
Start: 2020-12-06 — End: 2021-02-22

## 2020-12-06 NOTE — Patient Instructions (Signed)
Medication Instructions:  Your physician has recommended you make the following change in your medication:   INCREASE Metoprolol 50 mg and take one and one half tablet (75 mg) twice a day  *If you need a refill on your cardiac medications before your next appointment, please call your pharmacy*   Lab Work: None  If you have labs (blood work) drawn today and your tests are completely normal, you will receive your results only by: [place] (if you have MyChart) OR A paper copy in the mail If you have any lab test that is abnormal or we need to change your treatment, we will call you to review the results.   Testing/Procedures: None    Follow-Up: At [facility], you and your health needs are our priority.  As part of our continuing mission to provide you with exceptional heart care, we have created designated Provider Care Teams.  These Care Teams include your primary Cardiologist (physician) and Advanced Practice Providers (APPs -  Physician Assistants and Nurse Practitioners) who all work together to provide you with the care you need, when you need it.  Your next appointment:   4 month(s)  The format for your next appointment:   In Person  Provider:   You may see Kathlyn Sacramento, MD or one of the following Advanced Practice Providers on your designated Care Team:   Murray Hodgkins, NP Christell Faith, PA-C Marrianne Mood, PA-C Cad[place], [place]

## 2020-12-18 DIAGNOSIS — I5032 Chronic diastolic (congestive) heart failure: Secondary | ICD-10-CM | POA: Diagnosis not present

## 2020-12-18 DIAGNOSIS — I482 Chronic atrial fibrillation, unspecified: Secondary | ICD-10-CM | POA: Diagnosis not present

## 2020-12-18 DIAGNOSIS — G4733 Obstructive sleep apnea (adult) (pediatric): Secondary | ICD-10-CM | POA: Diagnosis not present

## 2020-12-18 DIAGNOSIS — J9601 Acute respiratory failure with hypoxia: Secondary | ICD-10-CM | POA: Diagnosis not present

## 2020-12-27 ENCOUNTER — Other Ambulatory Visit: Payer: Self-pay | Admitting: Oncology

## 2020-12-27 DIAGNOSIS — G4733 Obstructive sleep apnea (adult) (pediatric): Secondary | ICD-10-CM | POA: Diagnosis not present

## 2020-12-27 DIAGNOSIS — I2782 Chronic pulmonary embolism: Secondary | ICD-10-CM

## 2020-12-28 DIAGNOSIS — G4733 Obstructive sleep apnea (adult) (pediatric): Secondary | ICD-10-CM | POA: Diagnosis not present

## 2020-12-29 ENCOUNTER — Other Ambulatory Visit: Payer: BC Managed Care – PPO

## 2020-12-29 ENCOUNTER — Ambulatory Visit: Payer: BC Managed Care – PPO | Admitting: Oncology

## 2020-12-30 DIAGNOSIS — E1122 Type 2 diabetes mellitus with diabetic chronic kidney disease: Secondary | ICD-10-CM | POA: Diagnosis not present

## 2020-12-30 DIAGNOSIS — N184 Chronic kidney disease, stage 4 (severe): Secondary | ICD-10-CM | POA: Diagnosis not present

## 2020-12-30 DIAGNOSIS — R809 Proteinuria, unspecified: Secondary | ICD-10-CM | POA: Diagnosis not present

## 2020-12-30 DIAGNOSIS — I10 Essential (primary) hypertension: Secondary | ICD-10-CM | POA: Diagnosis not present

## 2021-01-10 ENCOUNTER — Inpatient Hospital Stay: Payer: BC Managed Care – PPO | Admitting: Oncology

## 2021-01-10 ENCOUNTER — Other Ambulatory Visit: Payer: Self-pay

## 2021-01-10 ENCOUNTER — Other Ambulatory Visit: Payer: Self-pay | Admitting: *Deleted

## 2021-01-10 ENCOUNTER — Inpatient Hospital Stay: Payer: BC Managed Care – PPO | Attending: Oncology

## 2021-01-10 ENCOUNTER — Encounter: Payer: Self-pay | Admitting: Oncology

## 2021-01-10 VITALS — BP 139/67 | HR 80 | Temp 98.6°F | Resp 20 | Wt 359.0 lb

## 2021-01-10 DIAGNOSIS — Z79899 Other long term (current) drug therapy: Secondary | ICD-10-CM | POA: Insufficient documentation

## 2021-01-10 DIAGNOSIS — Z7901 Long term (current) use of anticoagulants: Secondary | ICD-10-CM

## 2021-01-10 DIAGNOSIS — Z923 Personal history of irradiation: Secondary | ICD-10-CM | POA: Diagnosis not present

## 2021-01-10 DIAGNOSIS — Z86711 Personal history of pulmonary embolism: Secondary | ICD-10-CM | POA: Diagnosis not present

## 2021-01-10 DIAGNOSIS — Z08 Encounter for follow-up examination after completed treatment for malignant neoplasm: Secondary | ICD-10-CM | POA: Diagnosis not present

## 2021-01-10 DIAGNOSIS — Z170 Estrogen receptor positive status [ER+]: Secondary | ICD-10-CM | POA: Diagnosis not present

## 2021-01-10 DIAGNOSIS — Z853 Personal history of malignant neoplasm of breast: Secondary | ICD-10-CM | POA: Diagnosis not present

## 2021-01-10 DIAGNOSIS — C50911 Malignant neoplasm of unspecified site of right female breast: Secondary | ICD-10-CM

## 2021-01-10 LAB — COMPREHENSIVE METABOLIC PANEL
ALT: 15 U/L (ref 0–44)
AST: 18 U/L (ref 15–41)
Albumin: 3.7 g/dL (ref 3.5–5.0)
Alkaline Phosphatase: 121 U/L (ref 38–126)
Anion gap: 10 (ref 5–15)
BUN: 46 mg/dL — ABNORMAL HIGH (ref 6–20)
CO2: 34 mmol/L — ABNORMAL HIGH (ref 22–32)
Calcium: 9.1 mg/dL (ref 8.9–10.3)
Chloride: 100 mmol/L (ref 98–111)
Creatinine, Ser: 2.43 mg/dL — ABNORMAL HIGH (ref 0.44–1.00)
GFR, Estimated: 23 mL/min — ABNORMAL LOW (ref >60)
Glucose, Bld: 169 mg/dL — ABNORMAL HIGH (ref 70–99)
Potassium: 4.3 mmol/L (ref 3.5–5.1)
Sodium: 144 mmol/L (ref 135–145)
Total Bilirubin: 0.5 mg/dL (ref 0.3–1.2)
Total Protein: 7.7 g/dL (ref 6.5–8.1)

## 2021-01-10 LAB — CBC WITH DIFFERENTIAL/PLATELET
Abs Immature Granulocytes: 0.07 10*3/uL (ref 0.00–0.07)
Basophils Absolute: 0.1 10*3/uL (ref 0.0–0.1)
Basophils Relative: 0 %
Eosinophils Absolute: 0.3 10*3/uL (ref 0.0–0.5)
Eosinophils Relative: 3 %
HCT: 37.8 % (ref 36.0–46.0)
Hemoglobin: 11.1 g/dL — ABNORMAL LOW (ref 12.0–15.0)
Immature Granulocytes: 1 %
Lymphocytes Relative: 14 %
Lymphs Abs: 1.6 10*3/uL (ref 0.7–4.0)
MCH: 27.5 pg (ref 26.0–34.0)
MCHC: 29.4 g/dL — ABNORMAL LOW (ref 30.0–36.0)
MCV: 93.8 fL (ref 80.0–100.0)
Monocytes Absolute: 0.9 10*3/uL (ref 0.1–1.0)
Monocytes Relative: 8 %
Neutro Abs: 8.3 10*3/uL — ABNORMAL HIGH (ref 1.7–7.7)
Neutrophils Relative %: 74 %
Platelets: 222 10*3/uL (ref 150–400)
RBC: 4.03 MIL/uL (ref 3.87–5.11)
RDW: 15.2 % (ref 11.5–15.5)
WBC: 11.2 10*3/uL — ABNORMAL HIGH (ref 4.0–10.5)
nRBC: 0 % (ref 0.0–0.2)

## 2021-01-10 NOTE — Progress Notes (Signed)
Hematology/Oncology Consult note [facility]  [phone] [phone]  Patient Care Team: Lenard Simmer, MD as PCP - General (Radiology) Wellington Hampshire, MD as PCP - Cardiology (Cardiology) Christene Lye, MD (General Surgery) Alisa Graff, FNP as Nurse Practitioner (Family Medicine) Anthonette Legato, MD as Consulting Physician (Nephrology) Lequita Asal, MD (Inactive) as Referring Physician (Hematology and Oncology)   Name of the patient: Nicole Schmidt  [id number]  June 11, 1965   Date of visit: 01/10/21  Diagnosis-history of pulmonary embolism on Eliquis  Chief complaint/ Reason for visit-routine follow-up of pulmonary embolism   Heme/Onc history: Patient is a 55 year old female with history of 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  She is s/p surgery adjuvant radiation treatment and was on tamoxifen until September 2016 when she developed submassive pulmonary embolism.  She was switched to Femara and she completed 5 years of hormone therapy in June 2020.  Patient has been Eliquis for her pulmonary embolism since then.  Hypercoagulable work-up was negative.  Interval history-patient was admitted to the hospital in December 2021 for community-acquired pneumonia and has remained on home oxygen since then 2 L.  She follows up with pulmonary as well for this.  ECOG PS- 2 Pain scale- 0 Opioid associated constipation- no  Review of systems- Review of Systems  Constitutional:  Positive for malaise/fatigue. Negative for chills, fever and weight loss.  HENT:  Negative for congestion, ear discharge and nosebleeds.   Eyes:  Negative for blurred vision.  Respiratory:  Positive for shortness of breath. Negative for cough, hemoptysis, sputum production and wheezing.   Cardiovascular:  Negative for chest pain, palpitations, orthopnea and claudication.  Gastrointestinal:  Negative for abdominal  pain, blood in stool, constipation, diarrhea, heartburn, melena, nausea and vomiting.  Genitourinary:  Negative for dysuria, flank pain, frequency, hematuria and urgency.  Musculoskeletal:  Negative for back pain, joint pain and myalgias.  Skin:  Negative for rash.  Neurological:  Negative for dizziness, tingling, focal weakness, seizures, weakness and headaches.  Endo/Heme/Allergies:  Does not bruise/bleed easily.  Psychiatric/Behavioral:  Negative for depression and suicidal ideas. The patient does not have insomnia.       Not on File   Past Medical History:  Diagnosis Date   (HFpEF) heart failure with preserved ejection fraction ([place])    a. 10/2018 Echo: EF 60-65%, diast dysfxn, RVSP 50.7 mmHg, mildly dil LA.   Acquired hypothyroidism 02/09/2020   Anemia    Arthritis    Breast cancer ([name]) 2014   right breast cancer   Breast cancer of upper-inner quadrant of right female breast ([place])    Right breast invasive CA and DCIS , 7 mm T1,N0,M0. Er/PR pos, her 2 negative.  Margins 1 mm.   CHF (congestive heart failure) (HCC)    CKD (chronic kidney disease), stage III (HCC)    Diabetes mellitus type 2 in obese ([place]) 02/09/2020   Diabetes mellitus without complication ([place])    Dyslipidemia 02/09/2020   Essential hypertension    Gout    Hypothyroidism    Menopause    age 55   Morbid obesity ([place])    Persistent atrial fibrillation ([place])    a. Dx 10/2018 in setting of PNA. CHA2DS2VASc = 4-->Eliquis; b. 11/2018 s/p successful DCCV.   Personal history of radiation therapy    Pulmonary embolism ([place]) 10/2014   a. Chronic eliquis.   Thyroid goiter      Past Surgical History:  Procedure Laterality Date   BREAST BIOPSY Right 2014   breast ca   BREAST EXCISIONAL BIOPSY Right 07/19/2012   breast ca   BREAST SURGERY Right 2014   with sentinel node bx subareaolar duct excision   CARDIOVERSION N/A 11/28/2018   Procedure: CARDIOVERSION;  Surgeon: Minna Merritts, MD;  Location: [facility]  ORS;  Service: Cardiovascular;  Laterality: N/A;   COLONOSCOPY WITH PROPOFOL N/A 01/30/2017   Procedure: COLONOSCOPY WITH PROPOFOL;  Surgeon: Christene Lye, MD;  Location: [facility] ENDOSCOPY;  Service: Endoscopy;  Laterality: N/A;    Social History   Socioeconomic History   Marital status: Single    Spouse name: Not on file   Number of children: Not on file   Years of education: Not on file   Highest education level: Not on file  Occupational History   Occupation: retired  Tobacco Use   Smoking status: Never   Smokeless tobacco: Never  Vaping Use   Vaping Use: Never used  Substance and Sexual Activity   Alcohol use: No    Alcohol/week: 0.0 standard drinks   Drug use: No   Sexual activity: Not on file  Other Topics Concern   Not on file  Social History Narrative   Lives in [place] by herself.  Does not routinely exercise.  Works full-time - on her feet all day at work.   Social Determinants of Health   Financial Resource Strain: Not on file  Food Insecurity: Not on file  Transportation Needs: Not on file  Physical Activity: Not on file  Stress: Not on file  Social Connections: Not on file  Intimate Partner Violence: Not on file    Family History  Problem Relation Age of Onset   Diabetes Father    Hypertension Father    Parkinson's disease Father    Hypertension Mother    Rheum arthritis Mother    Atrial fibrillation Mother    Heart failure Mother    Heart attack Mother        died in her mid-70's.   Colon cancer Cousin 54   Breast cancer Neg Hx      Current Outpatient Medications:    acetaminophen (TYLENOL) 500 MG tablet, Take 1,000 mg by mouth every 6 (six) hours as needed (for pain)., Disp: , Rfl:    amLODipine (NORVASC) 5 MG tablet, Take 5 mg by mouth 2 (two) times daily., Disp: , Rfl:    atorvastatin (LIPITOR) 20 MG tablet, Take 20 mg by mouth at bedtime. , Disp: , Rfl:    calcitRIOL (ROCALTROL) 0.25 MCG capsule, Take 0.25 mcg by mouth daily.,  Disp: , Rfl:    calcium carbonate (OSCAL) 1500 (600 Ca) MG TABS tablet, Take 600 mg of elemental calcium by mouth daily. , Disp: , Rfl:    Cholecalciferol (VITAMIN D3) 50000 units TABS, Take 50,000 Units by mouth every Friday. , Disp: , Rfl:    CINNAMON PO, Take 1,000 mg by mouth daily. , Disp: , Rfl:    cloNIDine (CATAPRES) 0.2 MG tablet, Take 1 tablet (0.2 mg total) by mouth 2 (two) times daily., Disp: 60 tablet, Rfl: 0   CRANBERRY CONCENTRATE PO, Take 1 capsule by mouth daily., Disp: , Rfl:    Dulaglutide 1.5 MG/0.5ML SOPN, Inject 1.5 mg into the skin every 7 (seven) days., Disp: , Rfl:    ELIQUIS 5 MG TABS tablet, TAKE 1 TABLET TWICE A DAY, Disp: 180 tablet, Rfl: 3   folic acid (FOLVITE) 093 MCG tablet, Take 800 mcg  by mouth daily. , Disp: , Rfl:    furosemide (LASIX) 40 MG tablet, Take 1 tablet (40 mg total) by mouth daily., Disp: , Rfl:    levothyroxine (SYNTHROID, LEVOTHROID) 112 MCG tablet, Take 112 mcg daily before breakfast by mouth., Disp: , Rfl:    Melatonin 10 MG TABS, Take 10 mg by mouth at bedtime., Disp: , Rfl:    metoprolol tartrate (LOPRESSOR) 50 MG tablet, Take 1.5 tablets (75 mg total) by mouth 2 (two) times daily., Disp: 270 tablet, Rfl: 0   vitamin C (ASCORBIC ACID) 500 MG tablet, Take 500 mg by mouth 2 (two) times daily., Disp: , Rfl:    KLOR-CON M20 20 MEQ tablet, TAKE 1 TABLET DAILY (Patient not taking: Reported on 01/10/2021), Disp: 90 tablet, Rfl: 3  Physical exam:  Vitals:   01/10/21 1422  BP: 139/67  Pulse: 80  Resp: 20  Temp: 98.6 F (37 C)  TempSrc: Tympanic  SpO2: 100%  Weight: (!) 359 lb (162.8 kg)   Physical Exam Constitutional:      General: She is not in acute distress.    Appearance: She is obese.     Comments: On home oxygen  Cardiovascular:     Rate and Rhythm: Normal rate and regular rhythm.     Heart sounds: Normal heart sounds.  Pulmonary:     Effort: Pulmonary effort is normal.     Breath sounds: Normal breath sounds.  Skin:     General: Skin is warm and dry.  Neurological:     Mental Status: She is alert and oriented to person, place, and time.     CMP Latest Ref Rng & Units 01/10/2021  Glucose 70 - 99 mg/dL 169(H)  BUN 6 - 20 mg/dL 46(H)  Creatinine 0.44 - 1.00 mg/dL 2.43(H)  Sodium 135 - 145 mmol/L 144  Potassium 3.5 - 5.1 mmol/L 4.3  Chloride 98 - 111 mmol/L 100  CO2 22 - 32 mmol/L 34(H)  Calcium 8.9 - 10.3 mg/dL 9.1  Total Protein 6.5 - 8.1 g/dL 7.7  Total Bilirubin 0.3 - 1.2 mg/dL 0.5  Alkaline Phos 38 - 126 U/L 121  AST 15 - 41 U/L 18  ALT 0 - 44 U/L 15   CBC Latest Ref Rng & Units 01/10/2021  WBC 4.0 - 10.5 K/uL 11.2(H)  Hemoglobin 12.0 - 15.0 g/dL 11.1(L)  Hematocrit 36.0 - 46.0 % 37.8  Platelets 150 - 400 K/uL 222    Assessment and plan- Patient is a 55 y.o. female with history of breast cancer and pulmonary embolism here for routine follow-up  With regards to breast cancer-this was back in 2014 and she is more than 4 years since diagnosis of breast cancer.  However she wishes to continue to follow-up with us on a yearly basis.  Recent mammogram from October 2022 was unremarkable Schedule one for October of next year and see her back in 1 years time.  Breast exam today was limited as patient could not sit up on the examination table.  No palpable masses in bilateral breasts or no palpable bilateral axillary adenopathy.  History of pulmonary embolism: Will remain on Eliquis indefinitely given her limited mobility as well as morbid obesity.  Moreover she has chronic hypoxic respiratory failure   Visit Diagnosis 1. History of pulmonary embolism   2. Current use of long term anticoagulation   3. Encounter for follow-up surveillance of breast cancer      Dr. Archana Rao, MD, MPH [facility] [phone] 01/10/2021 5:19 PM

## 2021-01-11 LAB — CANCER ANTIGEN 27.29: CA 27.29: 22.1 U/mL (ref 0.0–38.6)

## 2021-01-13 ENCOUNTER — Encounter: Payer: Self-pay | Admitting: Adult Health

## 2021-01-13 ENCOUNTER — Other Ambulatory Visit: Payer: Self-pay

## 2021-01-13 ENCOUNTER — Ambulatory Visit: Payer: BC Managed Care – PPO | Admitting: Adult Health

## 2021-01-13 DIAGNOSIS — Z6841 Body Mass Index (BMI) 40.0 and over, adult: Secondary | ICD-10-CM

## 2021-01-13 DIAGNOSIS — Z9989 Dependence on other enabling machines and devices: Secondary | ICD-10-CM

## 2021-01-13 DIAGNOSIS — R5381 Other malaise: Secondary | ICD-10-CM

## 2021-01-13 DIAGNOSIS — J9611 Chronic respiratory failure with hypoxia: Secondary | ICD-10-CM | POA: Diagnosis not present

## 2021-01-13 DIAGNOSIS — G4733 Obstructive sleep apnea (adult) (pediatric): Secondary | ICD-10-CM

## 2021-01-13 NOTE — Assessment & Plan Note (Signed)
Continue on oxygen 2 L to maintain O2 saturations greater than 90%.

## 2021-01-13 NOTE — Patient Instructions (Signed)
Need to wear CPAP each night /every night for at least 6hrs  Wear all night long .  Work on healthy weight loss  Do not drive if sleepy  Continue on Oxygen 2l/m  Call back if you change your mind on Healthy weight and wellness referral .  Follow up with Dr. Halford Chessman or APP in [place] in 6 months

## 2021-01-13 NOTE — Progress Notes (Signed)
@Patient  ID: Nicole Schmidt, female    DOB: [birth year], 55 y.o.   MRN: [id number]  Chief Complaint  Patient presents with   Follow-up    Referring provider: Lenard Simmer, MD  HPI: 55 year old female followed for obstructive sleep apnea, OHS on CPAP and chronic respiratory failure on O2 on 2l/m  History of pulmonary embolism on Eliquis per her PCP, [facility]   TEST/EVENTS :  HST 04/26/19 >> AHI 17.1, SpO2 low 54%.  Spent 233.9 min with SpO2 < 89%.  01/13/2021 Follow up ; OSA, OHS, O2 RF  Patient returns for a 61-month follow-up.  Patient has underlying obstructive sleep apnea, OHS and chronic respiratory failure on oxygen 2 L . Patient says she has not been wearing CPAP as much , Does not like it. Long discussion regarding dangers of OSA and hypoxia. Encouraged on CPAP compliance . Uses the dream wear full face, likes it.  Has been working on weight loss, has not had much success. Says she is unable to go  Remains on Oxygen 2l/m .  Lives alone, drives. Sedentary lifestyle.   Not on File  Immunization History  Administered Date(s) Administered   Influenza-Unspecified 11/05/2018   PFIZER(Purple Top)SARS-COV-2 Vaccination 05/19/2019, 06/16/2019, 02/18/2020   Pneumococcal Polysaccharide-23 02/16/2020    Past Medical History:  Diagnosis Date   (HFpEF) heart failure with preserved ejection fraction ([place])    a. 10/2018 Echo: EF 60-65%, diast dysfxn, RVSP 50.7 mmHg, mildly dil LA.   Acquired hypothyroidism 02/09/2020   Anemia    Arthritis    Breast cancer ([place]) 2014   right breast cancer   Breast cancer of upper-inner quadrant of right female breast ([place])    Right breast invasive CA and DCIS , 7 mm T1,N0,M0. Er/PR pos, her 2 negative.  Margins 1 mm.   CHF (congestive heart failure) (HCC)    CKD (chronic kidney disease), stage III (HCC)    Diabetes mellitus type 2 in obese ([place]) 02/09/2020   Diabetes mellitus without complication ([place])    Dyslipidemia 02/09/2020   Essential  hypertension    Gout    Hypothyroidism    Menopause    age 10   Morbid obesity ([place])    Persistent atrial fibrillation ([place])    a. Dx 10/2018 in setting of PNA. CHA2DS2VASc = 4-->Eliquis; b. 11/2018 s/p successful DCCV.   Personal history of radiation therapy    Pulmonary embolism ([place]) 10/2014   a. Chronic eliquis.   Thyroid goiter     Tobacco History: Social History   Tobacco Use  Smoking Status Never  Smokeless Tobacco Never   Counseling given: Not Answered   Outpatient Medications Prior to Visit  Medication Sig Dispense Refill   acetaminophen (TYLENOL) 500 MG tablet Take 1,000 mg by mouth every 6 (six) hours as needed (for pain).     amLODipine (NORVASC) 5 MG tablet Take 5 mg by mouth 2 (two) times daily.     atorvastatin (LIPITOR) 20 MG tablet Take 20 mg by mouth at bedtime.      calcitRIOL (ROCALTROL) 0.25 MCG capsule Take 0.25 mcg by mouth daily.     calcium carbonate (OSCAL) 1500 (600 Ca) MG TABS tablet Take 600 mg of elemental calcium by mouth daily.      Cholecalciferol (VITAMIN D3) 50000 units TABS Take 50,000 Units by mouth every Friday.      CINNAMON PO Take 1,000 mg by mouth daily.      cloNIDine (CATAPRES) 0.2 MG tablet Take 1 tablet (  0.2 mg total) by mouth 2 (two) times daily. 60 tablet 0   CRANBERRY CONCENTRATE PO Take 1 capsule by mouth daily.     Dulaglutide 1.5 MG/0.5ML SOPN Inject 1.5 mg into the skin every 7 (seven) days.     ELIQUIS 5 MG TABS tablet TAKE 1 TABLET TWICE A DAY 846 tablet 3   folic acid (FOLVITE) 659 MCG tablet Take 800 mcg by mouth daily.      furosemide (LASIX) 40 MG tablet Take 1 tablet (40 mg total) by mouth daily.     KLOR-CON M20 20 MEQ tablet TAKE 1 TABLET DAILY 90 tablet 3   levothyroxine (SYNTHROID, LEVOTHROID) 112 MCG tablet Take 112 mcg daily before breakfast by mouth.     Melatonin 10 MG TABS Take 10 mg by mouth at bedtime.     metoprolol tartrate (LOPRESSOR) 50 MG tablet Take 1.5 tablets (75 mg total) by mouth 2 (two) times  daily. 270 tablet 0   vitamin C (ASCORBIC ACID) 500 MG tablet Take 500 mg by mouth 2 (two) times daily.     No facility-administered medications prior to visit.     Review of Systems:   Constitutional:   No  weight loss, night sweats,  Fevers, chills,  +fatigue, or  lassitude.  HEENT:   No headaches,  Difficulty swallowing,  Tooth/dental problems, or  Sore throat,                No sneezing, itching, ear ache, nasal congestion, post nasal drip,   CV:  No chest pain,  Orthopnea, PND, +swelling in lower extremities, no anasarca, dizziness, palpitations, syncope.   GI  No heartburn, indigestion, abdominal pain, nausea, vomiting, diarrhea, change in bowel habits, loss of appetite, bloody stools.   Resp:  No excess mucus, no productive cough,  No non-productive cough,  No coughing up of blood.  No change in color of mucus.  No wheezing.  No chest wall deformity  Skin: no rash or lesions.  GU: no dysuria, change in color of urine, no urgency or frequency.  No flank pain, no hematuria   MS:  No joint pain or swelling.  No decreased range of motion.  No back pain.    Physical Exam  BP 130/70 (BP Location: Left Arm, Patient Position: Sitting, Cuff Size: Large)   Pulse 83   Temp 98.1 F (36.7 C) (Oral)   Ht 5\' 4"  (1.626 m)   Wt (!) 359 lb (162.8 kg)   LMP 07/17/2012   SpO2 100%   BMI 61.62 kg/m   GEN: A/Ox3; pleasant , NAD, obese in wc    HEENT:  [place]/AT,  NOSE-clear, THROAT-clear, no lesions, no postnasal drip or exudate noted. Class 2-3 MP airway   NECK:  Supple w/ fair ROM; no JVD; normal carotid impulses w/o bruits; no thyromegaly or nodules palpated; no lymphadenopathy.    RESP  Clear  P & A; w/o, wheezes/ rales/ or rhonchi. no accessory muscle use, no dullness to percussion  CARD:  RRR, no m/r/g, 1+  peripheral edema, pulses intact, no cyanosis or clubbing.  GI:   Soft & nt; nml bowel sounds; no organomegaly or masses detected.   Musco: Warm bil, no deformities or  joint swelling noted.   Neuro: alert, no focal deficits noted.    Skin: Warm, no lesions or rashes    Lab Results:  CBC   BMET   BNP   ProBNP No results found for: PROBNP  Imaging: No results found.  No flowsheet data found.  No results found for: NITRICOXIDE      Assessment & Plan:   OSA on CPAP OSA, OHS and chronic respiratory failure.  Patient is encouraged on CPAP compliance.  Patient education on potential complications of untreated sleep apnea and hypoxemia.  Plan  Patient Instructions  Need to wear CPAP each night /every night for at least 6hrs  Wear all night long .  Work on healthy weight loss  Do not drive if sleepy  Continue on Oxygen 2l/m  Call back if you change your mind on Healthy weight and wellness referral .  Follow up with Dr. Halford Chessman or APP in [place] in 6 months   '   Morbid obesity with BMI of 60.0-69.9, adult [facility]) Long discussion with patient regarding weight loss.  Healthy diet.  I recommend that she be referred to healthy weight and wellness.  Patient declines at this time.  Patient says she is unable to drive to [place].  Physical deconditioning Activity as tolerated  Chronic respiratory failure with hypoxia (HCC) Continue on oxygen 2 L to maintain O2 saturations greater than 90%.     Rexene Edison, NP 01/13/2021

## 2021-01-13 NOTE — Assessment & Plan Note (Signed)
OSA, OHS and chronic respiratory failure.  Patient is encouraged on CPAP compliance.  Patient education on potential complications of untreated sleep apnea and hypoxemia.  Plan  Patient Instructions  Need to wear CPAP each night /every night for at least 6hrs  Wear all night long .  Work on healthy weight loss  Do not drive if sleepy  Continue on Oxygen 2l/m  Call back if you change your mind on Healthy weight and wellness referral .  Follow up with Dr. Halford Chessman or APP in [place] in 6 months   '

## 2021-01-13 NOTE — Assessment & Plan Note (Signed)
Long discussion with patient regarding weight loss.  Healthy diet.  I recommend that she be referred to healthy weight and wellness.  Patient declines at this time.  Patient says she is unable to drive to [place].

## 2021-01-13 NOTE — Assessment & Plan Note (Signed)
Activity as tolerated

## 2021-01-14 NOTE — Progress Notes (Signed)
Reviewed and agree with assessment/plan.   Chesley Mires, MD [facility] Pulmonary/Critical Care 01/14/2021, 7:15 AM Pager:  [phone]

## 2021-01-17 DIAGNOSIS — J9601 Acute respiratory failure with hypoxia: Secondary | ICD-10-CM | POA: Diagnosis not present

## 2021-01-17 DIAGNOSIS — G4733 Obstructive sleep apnea (adult) (pediatric): Secondary | ICD-10-CM | POA: Diagnosis not present

## 2021-01-17 DIAGNOSIS — I5032 Chronic diastolic (congestive) heart failure: Secondary | ICD-10-CM | POA: Diagnosis not present

## 2021-01-17 DIAGNOSIS — I482 Chronic atrial fibrillation, unspecified: Secondary | ICD-10-CM | POA: Diagnosis not present

## 2021-01-25 NOTE — Progress Notes (Signed)
Patient ID: Nicole Schmidt, female    DOB: 01/30/1966, 55 y.o.   MRN: [id number]  HPI  Nicole Schmidt is a 55 y/o female with a history of breast cancer, DM, HTN, CKD, thyroid disease, anemia, gout, pulmonary embolus and chronic heart failure.   Echo report from 02/09/20 reviewed and showed an EF of 65-70% along with severe LVH and moderate LAE. Echo report from 10/20/2018 reviewed and showed an EF of 60-65% along with an elevated PA pressure of 50.7 mmHg. Echo report from 11/20/17 reviewed and showed an EF of 55-60%.  Has not been admitted or been in the ED in the last 6 months.   She presents today for a follow-up visit with a chief complaint of moderate fatigue with minimal exertion. She describes this as chronic in nature having been present for several years. She has associated cough, shortness of breath, pedal edema & leg weakness along with this. She denies any difficulty sleeping, dizziness, abdominal distention, palpitations, chest pain or weight gain.   Continues to wear her CPAP at night and 2L of oxygen around the clock.   Past Medical History:  Diagnosis Date   (HFpEF) heart failure with preserved ejection fraction ([place])    a. 10/2018 Echo: EF 60-65%, diast dysfxn, RVSP 50.7 mmHg, mildly dil LA.   Acquired hypothyroidism 02/09/2020   Anemia    Arthritis    Breast cancer ([place]) 2014   right breast cancer   Breast cancer of upper-inner quadrant of right female breast ([place])    Right breast invasive CA and DCIS , 7 mm T1,N0,M0. Er/PR pos, her 2 negative.  Margins 1 mm.   CHF (congestive heart failure) (HCC)    CKD (chronic kidney disease), stage III (HCC)    Diabetes mellitus type 2 in obese ([place]) 02/09/2020   Diabetes mellitus without complication ([place])    Dyslipidemia 02/09/2020   Essential hypertension    Gout    Hypothyroidism    Menopause    age 18   Morbid obesity ([place])    Persistent atrial fibrillation ([place])    a. Dx 10/2018 in setting of PNA. CHA2DS2VASc = 4-->Eliquis; b.  11/2018 s/p successful DCCV.   Personal history of radiation therapy    Pulmonary embolism ([place]) 10/2014   a. Chronic eliquis.   Thyroid goiter    Past Surgical History:  Procedure Laterality Date   BREAST BIOPSY Right 2014   breast ca   BREAST EXCISIONAL BIOPSY Right 07/19/2012   breast ca   BREAST SURGERY Right 2014   with sentinel node bx subareaolar duct excision   CARDIOVERSION N/A 11/28/2018   Procedure: CARDIOVERSION;  Surgeon: Minna Merritts, MD;  Location: [facility] ORS;  Service: Cardiovascular;  Laterality: N/A;   COLONOSCOPY WITH PROPOFOL N/A 01/30/2017   Procedure: COLONOSCOPY WITH PROPOFOL;  Surgeon: Christene Lye, MD;  Location: [facility] ENDOSCOPY;  Service: Endoscopy;  Laterality: N/A;   Family History  Problem Relation Age of Onset   Diabetes Father    Hypertension Father    Parkinson's disease Father    Hypertension Mother    Rheum arthritis Mother    Atrial fibrillation Mother    Heart failure Mother    Heart attack Mother        died in her mid-70's.   Colon cancer Cousin 42   Breast cancer Neg Hx    Social History   Tobacco Use   Smoking status: Never   Smokeless tobacco: Never  Substance Use Topics   Alcohol  use: No    Alcohol/week: 0.0 standard drinks   Prior to Admission medications   Medication Sig Start Date End Date Taking? Authorizing Provider  acetaminophen (TYLENOL) 500 MG tablet Take 1,000 mg by mouth every 6 (six) hours as needed (for pain).   Yes [provider]  albuterol (VENTOLIN HFA) 108 (90 Base) MCG/ACT inhaler Inhale 2 puffs into the lungs every 6 (six) hours as needed for wheezing or shortness of breath.   Yes [provider]  amLODipine (NORVASC) 5 MG tablet Take 5 mg by mouth 2 (two) times daily. 09/18/18  Yes [provider]  atorvastatin (LIPITOR) 20 MG tablet Take 20 mg by mouth at bedtime.    Yes [provider]  calcitRIOL (ROCALTROL) 0.25 MCG capsule Take 0.25 mcg by mouth daily.  05/13/20  Yes [provider]  calcium carbonate (OSCAL) 1500 (600 Ca) MG TABS tablet Take 600 mg of elemental calcium by mouth daily.    Yes [provider]  Cholecalciferol (VITAMIN D3) 50000 units TABS Take 50,000 Units by mouth every Friday.    Yes [provider]  CINNAMON PO Take 1,000 mg by mouth daily.    Yes [provider]  cloNIDine (CATAPRES) 0.2 MG tablet Take 1 tablet (0.2 mg total) by mouth 2 (two) times daily. 10/21/18  Yes Mayo, Pete Pelt, MD  CRANBERRY CONCENTRATE PO Take 1 capsule by mouth daily.   Yes [provider]  Dulaglutide 1.5 MG/0.5ML SOPN Inject 1.5 mg into the skin every 7 (seven) days.   Yes [provider]  ELIQUIS 5 MG TABS tablet TAKE 1 TABLET TWICE A DAY 12/27/20  Yes Sindy Guadeloupe, MD  folic acid (FOLVITE) 299 MCG tablet Take 800 mcg by mouth daily.    Yes [provider]  furosemide (LASIX) 40 MG tablet Take 1 tablet (40 mg total) by mouth daily. 02/23/20  Yes Loletha Grayer, MD  KLOR-CON M20 20 MEQ tablet TAKE 1 TABLET DAILY 10/18/20  Yes Darylene Price A, FNP  levothyroxine (SYNTHROID, LEVOTHROID) 112 MCG tablet Take 112 mcg daily before breakfast by mouth.   Yes [provider]  Melatonin 10 MG TABS Take 10 mg by mouth at bedtime.   Yes [provider]  metoprolol tartrate (LOPRESSOR) 50 MG tablet Take 1.5 tablets (75 mg total) by mouth 2 (two) times daily. 12/06/20 03/06/21 Yes Dunn, Areta Haber, PA-C  vitamin C (ASCORBIC ACID) 500 MG tablet Take 500 mg by mouth 2 (two) times daily.   Yes [provider]  fluticasone-salmeterol (ADVAIR HFA) 230-21 MCG/ACT inhaler Inhale 2 puffs into the lungs 2 (two) times daily as needed.  Patient not taking: No sig reported  02/09/20  [provider]    Review of Systems  Constitutional:  Positive for fatigue (easily). Negative for appetite change.  HENT:  Negative for congestion, postnasal drip and sore throat.   Eyes: Negative.    Respiratory:  Positive for cough and shortness of breath.   Cardiovascular:  Positive for leg swelling. Negative for chest pain and palpitations.  Gastrointestinal:  Negative for abdominal distention and abdominal pain.  Endocrine: Negative.   Genitourinary: Negative.   Musculoskeletal:  Negative for back pain and neck pain.  Skin: Negative.   Allergic/Immunologic: Negative.   Neurological:  Positive for weakness. Negative for dizziness and light-headedness.  Hematological:  Negative for adenopathy. Does not bruise/bleed easily.  Psychiatric/Behavioral:  Negative for dysphoric mood and sleep disturbance. The patient is not nervous/anxious.    Vitals:  01/26/21 1351  BP: (!) 144/72  Pulse: 87  Resp: 18  SpO2: 96%  Weight: (!) 351 lb (159.2 kg)  Height: 5\' 4"  (1.626 m)   Wt Readings from Last 3 Encounters:  01/26/21 (!) 351 lb (159.2 kg)  01/13/21 (!) 359 lb (162.8 kg)  01/10/21 (!) 359 lb (162.8 kg)   Lab Results  Component Value Date   CREATININE 2.43 (H) 01/10/2021   CREATININE 1.83 (H) 02/27/2020   CREATININE 2.03 (H) 02/23/2020    Physical Exam Vitals reviewed.  Constitutional:      Appearance: Normal appearance.  HENT:     Head: Normocephalic and atraumatic.  Cardiovascular:     Rate and Rhythm: Normal rate and regular rhythm.  Pulmonary:     Effort: Pulmonary effort is normal. No respiratory distress.     Breath sounds: No wheezing or rales.  Abdominal:     General: There is no distension.     Palpations: Abdomen is soft.  Musculoskeletal:        General: No tenderness.     Cervical back: Normal range of motion and neck supple.     Right lower leg: Edema (trace pitting) present.     Left lower leg: Edema (trace pitting) present.  Skin:    General: Skin is warm and dry.  Neurological:     General: No focal deficit present.     Mental Status: She is alert and oriented to person, place, and time.  Psychiatric:        Mood and Affect: Mood normal.         Behavior: Behavior normal.        Thought Content: Thought content normal.    Assessment & Plan:  1: Chronic heart failure with preserved ejection fraction with structural changes (LVH)- - NYHA class III - euvolemic today - weighing daily; reminded to weigh every morning, write the weight down and call for an overnight weight gain of >2 pounds or a weekly weight gain of >5 pounds - weight down 5 pounds pounds from last visit here 6 months ago - saw cardiology (Dunn) 12/06/20 - not adding salt and has been reading food labels; reviewed the importance of closely following a 2000mg  sodium diet  - renal function limits medication usage - BNP 02/20/20 was 210.7  2: HTN- - BP mildly elevated (144/72) - BMP from 01/10/21 reviewed and showed sodium 144, potassium 4.3, creatinine 2.43 and GFR 23 - saw PCP Ronnald Collum) March 2022 - saw nephrology Holley Raring) 12/30/20  3: Lymphedema- - stage 2 - encouraged her to elevate her legs when sitting for long periods of time  4: Obstructive sleep apnea- - saw pulmonology ([place]) 01/13/21 - wearing CPAP - now wearing oxygen at 2L around the clock    Medication list reviewed.   Due to HF stability and numerous other providers, will not make a return appointment for patient at this time. Advised patient to follow closely with cardiology but that if she needed [name] for anything to give [name] a call and we could make another appointment for her. Patient was comfortable with this plan.

## 2021-01-26 ENCOUNTER — Encounter: Payer: Self-pay | Admitting: Family

## 2021-01-26 ENCOUNTER — Other Ambulatory Visit: Payer: Self-pay

## 2021-01-26 ENCOUNTER — Ambulatory Visit: Payer: BC Managed Care – PPO | Attending: Family | Admitting: Family

## 2021-01-26 VITALS — BP 144/72 | HR 87 | Resp 18 | Ht 64.0 in | Wt 351.0 lb

## 2021-01-26 DIAGNOSIS — Z86711 Personal history of pulmonary embolism: Secondary | ICD-10-CM | POA: Insufficient documentation

## 2021-01-26 DIAGNOSIS — G4733 Obstructive sleep apnea (adult) (pediatric): Secondary | ICD-10-CM | POA: Insufficient documentation

## 2021-01-26 DIAGNOSIS — E1122 Type 2 diabetes mellitus with diabetic chronic kidney disease: Secondary | ICD-10-CM | POA: Diagnosis not present

## 2021-01-26 DIAGNOSIS — E039 Hypothyroidism, unspecified: Secondary | ICD-10-CM | POA: Insufficient documentation

## 2021-01-26 DIAGNOSIS — M109 Gout, unspecified: Secondary | ICD-10-CM | POA: Diagnosis not present

## 2021-01-26 DIAGNOSIS — I5032 Chronic diastolic (congestive) heart failure: Secondary | ICD-10-CM | POA: Diagnosis not present

## 2021-01-26 DIAGNOSIS — I890 Lymphedema, not elsewhere classified: Secondary | ICD-10-CM | POA: Diagnosis not present

## 2021-01-26 DIAGNOSIS — N183 Chronic kidney disease, stage 3 unspecified: Secondary | ICD-10-CM | POA: Insufficient documentation

## 2021-01-26 DIAGNOSIS — Z9981 Dependence on supplemental oxygen: Secondary | ICD-10-CM | POA: Insufficient documentation

## 2021-01-26 DIAGNOSIS — Z9989 Dependence on other enabling machines and devices: Secondary | ICD-10-CM | POA: Diagnosis not present

## 2021-01-26 DIAGNOSIS — I10 Essential (primary) hypertension: Secondary | ICD-10-CM | POA: Diagnosis not present

## 2021-01-26 DIAGNOSIS — Z853 Personal history of malignant neoplasm of breast: Secondary | ICD-10-CM | POA: Insufficient documentation

## 2021-01-26 DIAGNOSIS — I130 Hypertensive heart and chronic kidney disease with heart failure and stage 1 through stage 4 chronic kidney disease, or unspecified chronic kidney disease: Secondary | ICD-10-CM | POA: Diagnosis not present

## 2021-01-26 DIAGNOSIS — D649 Anemia, unspecified: Secondary | ICD-10-CM | POA: Diagnosis not present

## 2021-01-26 NOTE — Patient Instructions (Addendum)
Continue weighing daily and call for an overnight weight gain of 3 pounds or more or a weekly weight gain of more than 5 pounds.  ° ° ° °Call us in the future if you need us for anything °

## 2021-01-27 ENCOUNTER — Encounter: Payer: Self-pay | Admitting: Family

## 2021-02-08 ENCOUNTER — Encounter: Payer: Self-pay | Admitting: Emergency Medicine

## 2021-02-08 ENCOUNTER — Other Ambulatory Visit: Payer: Self-pay

## 2021-02-08 ENCOUNTER — Emergency Department: Payer: BC Managed Care – PPO

## 2021-02-08 DIAGNOSIS — N186 End stage renal disease: Secondary | ICD-10-CM | POA: Diagnosis not present

## 2021-02-08 DIAGNOSIS — N201 Calculus of ureter: Secondary | ICD-10-CM | POA: Diagnosis not present

## 2021-02-08 DIAGNOSIS — Z87448 Personal history of other diseases of urinary system: Secondary | ICD-10-CM | POA: Diagnosis not present

## 2021-02-08 DIAGNOSIS — Z992 Dependence on renal dialysis: Secondary | ICD-10-CM | POA: Diagnosis not present

## 2021-02-08 DIAGNOSIS — I700 Atherosclerosis of aorta: Secondary | ICD-10-CM | POA: Diagnosis not present

## 2021-02-08 DIAGNOSIS — R0602 Shortness of breath: Secondary | ICD-10-CM | POA: Diagnosis not present

## 2021-02-08 DIAGNOSIS — Z86711 Personal history of pulmonary embolism: Secondary | ICD-10-CM

## 2021-02-08 DIAGNOSIS — G4733 Obstructive sleep apnea (adult) (pediatric): Secondary | ICD-10-CM | POA: Diagnosis not present

## 2021-02-08 DIAGNOSIS — N179 Acute kidney failure, unspecified: Secondary | ICD-10-CM | POA: Diagnosis not present

## 2021-02-08 DIAGNOSIS — E875 Hyperkalemia: Secondary | ICD-10-CM | POA: Diagnosis not present

## 2021-02-08 DIAGNOSIS — D509 Iron deficiency anemia, unspecified: Secondary | ICD-10-CM | POA: Diagnosis present

## 2021-02-08 DIAGNOSIS — Z800 Family history of malignant neoplasm of digestive organs: Secondary | ICD-10-CM

## 2021-02-08 DIAGNOSIS — E785 Hyperlipidemia, unspecified: Secondary | ICD-10-CM | POA: Diagnosis not present

## 2021-02-08 DIAGNOSIS — D631 Anemia in chronic kidney disease: Secondary | ICD-10-CM | POA: Diagnosis not present

## 2021-02-08 DIAGNOSIS — N1330 Unspecified hydronephrosis: Secondary | ICD-10-CM | POA: Diagnosis not present

## 2021-02-08 DIAGNOSIS — Z7189 Other specified counseling: Secondary | ICD-10-CM | POA: Diagnosis not present

## 2021-02-08 DIAGNOSIS — Z8701 Personal history of pneumonia (recurrent): Secondary | ICD-10-CM

## 2021-02-08 DIAGNOSIS — Z8249 Family history of ischemic heart disease and other diseases of the circulatory system: Secondary | ICD-10-CM

## 2021-02-08 DIAGNOSIS — E039 Hypothyroidism, unspecified: Secondary | ICD-10-CM | POA: Diagnosis not present

## 2021-02-08 DIAGNOSIS — I129 Hypertensive chronic kidney disease with stage 1 through stage 4 chronic kidney disease, or unspecified chronic kidney disease: Secondary | ICD-10-CM | POA: Diagnosis not present

## 2021-02-08 DIAGNOSIS — E1122 Type 2 diabetes mellitus with diabetic chronic kidney disease: Secondary | ICD-10-CM | POA: Diagnosis not present

## 2021-02-08 DIAGNOSIS — M109 Gout, unspecified: Secondary | ICD-10-CM | POA: Diagnosis present

## 2021-02-08 DIAGNOSIS — E669 Obesity, unspecified: Secondary | ICD-10-CM | POA: Diagnosis not present

## 2021-02-08 DIAGNOSIS — I517 Cardiomegaly: Secondary | ICD-10-CM | POA: Diagnosis not present

## 2021-02-08 DIAGNOSIS — N2581 Secondary hyperparathyroidism of renal origin: Secondary | ICD-10-CM | POA: Diagnosis present

## 2021-02-08 DIAGNOSIS — I482 Chronic atrial fibrillation, unspecified: Secondary | ICD-10-CM | POA: Diagnosis not present

## 2021-02-08 DIAGNOSIS — I5031 Acute diastolic (congestive) heart failure: Secondary | ICD-10-CM | POA: Diagnosis not present

## 2021-02-08 DIAGNOSIS — Z79890 Hormone replacement therapy (postmenopausal): Secondary | ICD-10-CM

## 2021-02-08 DIAGNOSIS — R5381 Other malaise: Secondary | ICD-10-CM | POA: Diagnosis not present

## 2021-02-08 DIAGNOSIS — J9611 Chronic respiratory failure with hypoxia: Secondary | ICD-10-CM | POA: Diagnosis not present

## 2021-02-08 DIAGNOSIS — I5033 Acute on chronic diastolic (congestive) heart failure: Secondary | ICD-10-CM | POA: Diagnosis present

## 2021-02-08 DIAGNOSIS — N200 Calculus of kidney: Secondary | ICD-10-CM | POA: Diagnosis not present

## 2021-02-08 DIAGNOSIS — J9621 Acute and chronic respiratory failure with hypoxia: Secondary | ICD-10-CM | POA: Diagnosis present

## 2021-02-08 DIAGNOSIS — Z6841 Body Mass Index (BMI) 40.0 and over, adult: Secondary | ICD-10-CM

## 2021-02-08 DIAGNOSIS — Z79899 Other long term (current) drug therapy: Secondary | ICD-10-CM

## 2021-02-08 DIAGNOSIS — R079 Chest pain, unspecified: Secondary | ICD-10-CM | POA: Diagnosis not present

## 2021-02-08 DIAGNOSIS — N184 Chronic kidney disease, stage 4 (severe): Secondary | ICD-10-CM | POA: Diagnosis present

## 2021-02-08 DIAGNOSIS — J9601 Acute respiratory failure with hypoxia: Secondary | ICD-10-CM | POA: Diagnosis not present

## 2021-02-08 DIAGNOSIS — N170 Acute kidney failure with tubular necrosis: Secondary | ICD-10-CM | POA: Diagnosis present

## 2021-02-08 DIAGNOSIS — Z923 Personal history of irradiation: Secondary | ICD-10-CM

## 2021-02-08 DIAGNOSIS — I509 Heart failure, unspecified: Secondary | ICD-10-CM | POA: Diagnosis not present

## 2021-02-08 DIAGNOSIS — N189 Chronic kidney disease, unspecified: Secondary | ICD-10-CM | POA: Diagnosis not present

## 2021-02-08 DIAGNOSIS — Z7901 Long term (current) use of anticoagulants: Secondary | ICD-10-CM | POA: Diagnosis not present

## 2021-02-08 DIAGNOSIS — I132 Hypertensive heart and chronic kidney disease with heart failure and with stage 5 chronic kidney disease, or end stage renal disease: Secondary | ICD-10-CM | POA: Diagnosis not present

## 2021-02-08 DIAGNOSIS — Z9989 Dependence on other enabling machines and devices: Secondary | ICD-10-CM | POA: Diagnosis not present

## 2021-02-08 DIAGNOSIS — E1169 Type 2 diabetes mellitus with other specified complication: Secondary | ICD-10-CM | POA: Diagnosis not present

## 2021-02-08 DIAGNOSIS — R310 Gross hematuria: Secondary | ICD-10-CM | POA: Diagnosis not present

## 2021-02-08 DIAGNOSIS — Z833 Family history of diabetes mellitus: Secondary | ICD-10-CM

## 2021-02-08 DIAGNOSIS — Z20822 Contact with and (suspected) exposure to covid-19: Secondary | ICD-10-CM | POA: Diagnosis not present

## 2021-02-08 DIAGNOSIS — Z853 Personal history of malignant neoplasm of breast: Secondary | ICD-10-CM

## 2021-02-08 DIAGNOSIS — J069 Acute upper respiratory infection, unspecified: Secondary | ICD-10-CM | POA: Diagnosis present

## 2021-02-08 DIAGNOSIS — J189 Pneumonia, unspecified organism: Secondary | ICD-10-CM | POA: Diagnosis present

## 2021-02-08 DIAGNOSIS — N132 Hydronephrosis with renal and ureteral calculous obstruction: Secondary | ICD-10-CM | POA: Diagnosis not present

## 2021-02-08 DIAGNOSIS — I878 Other specified disorders of veins: Secondary | ICD-10-CM | POA: Diagnosis present

## 2021-02-08 DIAGNOSIS — I5032 Chronic diastolic (congestive) heart failure: Secondary | ICD-10-CM | POA: Diagnosis not present

## 2021-02-08 DIAGNOSIS — I10 Essential (primary) hypertension: Secondary | ICD-10-CM | POA: Diagnosis not present

## 2021-02-08 DIAGNOSIS — I130 Hypertensive heart and chronic kidney disease with heart failure and stage 1 through stage 4 chronic kidney disease, or unspecified chronic kidney disease: Principal | ICD-10-CM | POA: Diagnosis present

## 2021-02-08 DIAGNOSIS — R9431 Abnormal electrocardiogram [ECG] [EKG]: Secondary | ICD-10-CM | POA: Diagnosis not present

## 2021-02-08 DIAGNOSIS — J40 Bronchitis, not specified as acute or chronic: Secondary | ICD-10-CM | POA: Diagnosis not present

## 2021-02-08 DIAGNOSIS — D508 Other iron deficiency anemias: Secondary | ICD-10-CM | POA: Diagnosis not present

## 2021-02-08 DIAGNOSIS — N183 Chronic kidney disease, stage 3 unspecified: Secondary | ICD-10-CM | POA: Diagnosis not present

## 2021-02-08 DIAGNOSIS — I1310 Hypertensive heart and chronic kidney disease without heart failure, with stage 1 through stage 4 chronic kidney disease, or unspecified chronic kidney disease: Secondary | ICD-10-CM | POA: Diagnosis not present

## 2021-02-08 DIAGNOSIS — Z820 Family history of epilepsy and other diseases of the nervous system: Secondary | ICD-10-CM

## 2021-02-08 DIAGNOSIS — E8779 Other fluid overload: Secondary | ICD-10-CM | POA: Diagnosis not present

## 2021-02-08 LAB — CBC
HCT: 36.5 % (ref 36.0–46.0)
Hemoglobin: 11 g/dL — ABNORMAL LOW (ref 12.0–15.0)
MCH: 28.1 pg (ref 26.0–34.0)
MCHC: 30.1 g/dL (ref 30.0–36.0)
MCV: 93.4 fL (ref 80.0–100.0)
Platelets: 234 10*3/uL (ref 150–400)
RBC: 3.91 MIL/uL (ref 3.87–5.11)
RDW: 15.2 % (ref 11.5–15.5)
WBC: 13.4 10*3/uL — ABNORMAL HIGH (ref 4.0–10.5)
nRBC: 0 % (ref 0.0–0.2)

## 2021-02-08 LAB — BASIC METABOLIC PANEL
Anion gap: 9 (ref 5–15)
BUN: 84 mg/dL — ABNORMAL HIGH (ref 6–20)
CO2: 25 mmol/L (ref 22–32)
Calcium: 9.2 mg/dL (ref 8.9–10.3)
Chloride: 105 mmol/L (ref 98–111)
Creatinine, Ser: 4.59 mg/dL — ABNORMAL HIGH (ref 0.44–1.00)
GFR, Estimated: 11 mL/min — ABNORMAL LOW (ref >60)
Glucose, Bld: 156 mg/dL — ABNORMAL HIGH (ref 70–99)
Potassium: 4.7 mmol/L (ref 3.5–5.1)
Sodium: 139 mmol/L (ref 135–145)

## 2021-02-08 LAB — RESP PANEL BY RT-PCR (FLU A&B, COVID) ARPGX2
Influenza A by PCR: NEGATIVE
Influenza B by PCR: NEGATIVE
SARS Coronavirus 2 by RT PCR: NEGATIVE

## 2021-02-08 LAB — GROUP A STREP BY PCR: Group A Strep by PCR: NOT DETECTED

## 2021-02-08 LAB — TROPONIN I (HIGH SENSITIVITY): Troponin I (High Sensitivity): 8 ng/L (ref <18)

## 2021-02-08 LAB — BRAIN NATRIURETIC PEPTIDE: B Natriuretic Peptide: 421 pg/mL — ABNORMAL HIGH (ref 0.0–100.0)

## 2021-02-08 NOTE — ED Triage Notes (Signed)
Pt arrived via POV with reports of sneezing and scratchy throat since Friday, pt states her sxs worsened. Pt states she has hx of pneumonia and wears oxygen continuous 2l [place] at home since she had pneumonia 1 year ago.  Denies any chest pain. Pt states she does not feel like she is taking as good of breath as she should.  Pt has no known sick contacts but has been to family gatherings.

## 2021-02-08 NOTE — ED Notes (Signed)
Pt also notes she has had increased swelling to lower extremities with hx of heart failure.  Pt has 2+ pitting edema.  Pt placed on hospital oxygen tank. Pt's home O2 tanks placed at registration desk.

## 2021-02-09 ENCOUNTER — Inpatient Hospital Stay: Payer: BC Managed Care – PPO

## 2021-02-09 ENCOUNTER — Inpatient Hospital Stay
Admission: EM | Admit: 2021-02-09 | Discharge: 2021-02-22 | DRG: 987 | Disposition: A | Discharge status: Rehabilitation Facility | Payer: BC Managed Care – PPO | Attending: Student in an Organized Health Care Education/Training Program | Admitting: Student in an Organized Health Care Education/Training Program

## 2021-02-09 ENCOUNTER — Inpatient Hospital Stay (HOSPITAL_COMMUNITY)
Admit: 2021-02-09 | Discharge: 2021-02-09 | Disposition: A | Discharge status: Home / Self Care | Payer: BC Managed Care – PPO | Attending: Osteopathic Medicine | Admitting: Osteopathic Medicine

## 2021-02-09 DIAGNOSIS — E039 Hypothyroidism, unspecified: Secondary | ICD-10-CM | POA: Diagnosis present

## 2021-02-09 DIAGNOSIS — J9611 Chronic respiratory failure with hypoxia: Secondary | ICD-10-CM | POA: Diagnosis present

## 2021-02-09 DIAGNOSIS — E875 Hyperkalemia: Secondary | ICD-10-CM | POA: Diagnosis present

## 2021-02-09 DIAGNOSIS — I10 Essential (primary) hypertension: Secondary | ICD-10-CM | POA: Diagnosis present

## 2021-02-09 DIAGNOSIS — E8779 Other fluid overload: Secondary | ICD-10-CM | POA: Diagnosis not present

## 2021-02-09 DIAGNOSIS — N1330 Unspecified hydronephrosis: Secondary | ICD-10-CM | POA: Diagnosis not present

## 2021-02-09 DIAGNOSIS — G4733 Obstructive sleep apnea (adult) (pediatric): Secondary | ICD-10-CM

## 2021-02-09 DIAGNOSIS — N132 Hydronephrosis with renal and ureteral calculous obstruction: Secondary | ICD-10-CM | POA: Diagnosis present

## 2021-02-09 DIAGNOSIS — N183 Chronic kidney disease, stage 3 unspecified: Secondary | ICD-10-CM | POA: Diagnosis not present

## 2021-02-09 DIAGNOSIS — N184 Chronic kidney disease, stage 4 (severe): Secondary | ICD-10-CM | POA: Diagnosis present

## 2021-02-09 DIAGNOSIS — Z9989 Dependence on other enabling machines and devices: Secondary | ICD-10-CM

## 2021-02-09 DIAGNOSIS — N201 Calculus of ureter: Secondary | ICD-10-CM | POA: Diagnosis not present

## 2021-02-09 DIAGNOSIS — E1169 Type 2 diabetes mellitus with other specified complication: Secondary | ICD-10-CM | POA: Diagnosis not present

## 2021-02-09 DIAGNOSIS — E669 Obesity, unspecified: Secondary | ICD-10-CM | POA: Diagnosis not present

## 2021-02-09 DIAGNOSIS — D509 Iron deficiency anemia, unspecified: Secondary | ICD-10-CM | POA: Diagnosis present

## 2021-02-09 DIAGNOSIS — I1310 Hypertensive heart and chronic kidney disease without heart failure, with stage 1 through stage 4 chronic kidney disease, or unspecified chronic kidney disease: Secondary | ICD-10-CM | POA: Diagnosis not present

## 2021-02-09 DIAGNOSIS — J189 Pneumonia, unspecified organism: Secondary | ICD-10-CM | POA: Diagnosis present

## 2021-02-09 DIAGNOSIS — N170 Acute kidney failure with tubular necrosis: Secondary | ICD-10-CM | POA: Diagnosis present

## 2021-02-09 DIAGNOSIS — E1122 Type 2 diabetes mellitus with diabetic chronic kidney disease: Secondary | ICD-10-CM | POA: Diagnosis present

## 2021-02-09 DIAGNOSIS — N2581 Secondary hyperparathyroidism of renal origin: Secondary | ICD-10-CM | POA: Diagnosis not present

## 2021-02-09 DIAGNOSIS — I5031 Acute diastolic (congestive) heart failure: Secondary | ICD-10-CM | POA: Diagnosis not present

## 2021-02-09 DIAGNOSIS — J40 Bronchitis, not specified as acute or chronic: Secondary | ICD-10-CM

## 2021-02-09 DIAGNOSIS — Z7189 Other specified counseling: Secondary | ICD-10-CM | POA: Diagnosis not present

## 2021-02-09 DIAGNOSIS — I482 Chronic atrial fibrillation, unspecified: Secondary | ICD-10-CM | POA: Diagnosis present

## 2021-02-09 DIAGNOSIS — N185 Chronic kidney disease, stage 5: Secondary | ICD-10-CM | POA: Diagnosis not present

## 2021-02-09 DIAGNOSIS — J9621 Acute and chronic respiratory failure with hypoxia: Secondary | ICD-10-CM | POA: Diagnosis present

## 2021-02-09 DIAGNOSIS — I132 Hypertensive heart and chronic kidney disease with heart failure and with stage 5 chronic kidney disease, or end stage renal disease: Secondary | ICD-10-CM | POA: Diagnosis present

## 2021-02-09 DIAGNOSIS — R5381 Other malaise: Secondary | ICD-10-CM | POA: Diagnosis not present

## 2021-02-09 DIAGNOSIS — I130 Hypertensive heart and chronic kidney disease with heart failure and stage 1 through stage 4 chronic kidney disease, or unspecified chronic kidney disease: Secondary | ICD-10-CM | POA: Diagnosis not present

## 2021-02-09 DIAGNOSIS — J069 Acute upper respiratory infection, unspecified: Secondary | ICD-10-CM | POA: Diagnosis present

## 2021-02-09 DIAGNOSIS — Z86711 Personal history of pulmonary embolism: Secondary | ICD-10-CM | POA: Diagnosis not present

## 2021-02-09 DIAGNOSIS — D508 Other iron deficiency anemias: Secondary | ICD-10-CM | POA: Diagnosis not present

## 2021-02-09 DIAGNOSIS — I5033 Acute on chronic diastolic (congestive) heart failure: Secondary | ICD-10-CM | POA: Diagnosis present

## 2021-02-09 DIAGNOSIS — Z7901 Long term (current) use of anticoagulants: Secondary | ICD-10-CM | POA: Diagnosis not present

## 2021-02-09 DIAGNOSIS — Z992 Dependence on renal dialysis: Secondary | ICD-10-CM | POA: Diagnosis not present

## 2021-02-09 DIAGNOSIS — E785 Hyperlipidemia, unspecified: Secondary | ICD-10-CM | POA: Diagnosis present

## 2021-02-09 DIAGNOSIS — I878 Other specified disorders of veins: Secondary | ICD-10-CM | POA: Diagnosis present

## 2021-02-09 DIAGNOSIS — D631 Anemia in chronic kidney disease: Secondary | ICD-10-CM | POA: Diagnosis not present

## 2021-02-09 DIAGNOSIS — Z20822 Contact with and (suspected) exposure to covid-19: Secondary | ICD-10-CM | POA: Diagnosis present

## 2021-02-09 DIAGNOSIS — Z800 Family history of malignant neoplasm of digestive organs: Secondary | ICD-10-CM | POA: Diagnosis not present

## 2021-02-09 DIAGNOSIS — N179 Acute kidney failure, unspecified: Secondary | ICD-10-CM | POA: Insufficient documentation

## 2021-02-09 DIAGNOSIS — N186 End stage renal disease: Secondary | ICD-10-CM | POA: Diagnosis present

## 2021-02-09 DIAGNOSIS — Z6841 Body Mass Index (BMI) 40.0 and over, adult: Secondary | ICD-10-CM | POA: Diagnosis not present

## 2021-02-09 DIAGNOSIS — M109 Gout, unspecified: Secondary | ICD-10-CM | POA: Diagnosis present

## 2021-02-09 LAB — URINALYSIS, ROUTINE W REFLEX MICROSCOPIC
Bilirubin Urine: NEGATIVE
Glucose, UA: NEGATIVE mg/dL
Ketones, ur: NEGATIVE mg/dL
Leukocytes,Ua: NEGATIVE
Nitrite: NEGATIVE
Protein, ur: 30 mg/dL — AB
Specific Gravity, Urine: 1.008 (ref 1.005–1.030)
pH: 5 (ref 5.0–8.0)

## 2021-02-09 LAB — TROPONIN I (HIGH SENSITIVITY): Troponin I (High Sensitivity): 8 ng/L (ref <18)

## 2021-02-09 LAB — HIV ANTIBODY (ROUTINE TESTING W REFLEX): HIV Screen 4th Generation wRfx: NONREACTIVE

## 2021-02-09 LAB — SODIUM, URINE, RANDOM: Sodium, Ur: 89 mmol/L

## 2021-02-09 LAB — TSH: TSH: 0.267 u[IU]/mL — ABNORMAL LOW (ref 0.350–4.500)

## 2021-02-09 LAB — PROCALCITONIN: Procalcitonin: <0.1 ng/mL

## 2021-02-09 LAB — CBG MONITORING, ED
Glucose-Capillary: 151 mg/dL — ABNORMAL HIGH (ref 70–99)
Glucose-Capillary: 142 mg/dL — ABNORMAL HIGH (ref 70–99)

## 2021-02-09 LAB — CREATININE, URINE, RANDOM: Creatinine, Urine: 43 mg/dL

## 2021-02-09 MED ORDER — AMLODIPINE BESYLATE 5 MG PO TABS
5.0000 mg | ORAL_TABLET | Freq: Two times a day (BID) | ORAL | Status: DC
  Administered 2021-02-09 – 2021-02-18 (×19): 5 mg via ORAL
  Filled 2021-02-09 (×19): qty 1

## 2021-02-09 MED ORDER — IPRATROPIUM-ALBUTEROL 0.5-2.5 (3) MG/3ML IN SOLN
3.0000 mL | Freq: Four times a day (QID) | RESPIRATORY_TRACT | Status: DC
  Administered 2021-02-09 – 2021-02-11 (×7): 3 mL via RESPIRATORY_TRACT
  Filled 2021-02-09 (×7): qty 3

## 2021-02-09 MED ORDER — FOLIC ACID 1 MG PO TABS
1000.0000 ug | ORAL_TABLET | Freq: Every day | ORAL | Status: DC
  Administered 2021-02-09 – 2021-02-22 (×14): 1 mg via ORAL
  Filled 2021-02-09 (×14): qty 1

## 2021-02-09 MED ORDER — METOPROLOL TARTRATE 50 MG PO TABS
75.0000 mg | ORAL_TABLET | Freq: Two times a day (BID) | ORAL | Status: DC
  Administered 2021-02-09 – 2021-02-18 (×19): 75 mg via ORAL
  Filled 2021-02-09 (×19): qty 1

## 2021-02-09 MED ORDER — PERFLUTREN LIPID MICROSPHERE
1.0000 mL | INTRAVENOUS | Status: DC | PRN
Start: 2021-02-09 — End: 2021-02-10
  Administered 2021-02-09: 22:00:00 3 mL via INTRAVENOUS
  Filled 2021-02-09: qty 10

## 2021-02-09 MED ORDER — ALBUTEROL SULFATE (2.5 MG/3ML) 0.083% IN NEBU
2.5000 mg | INHALATION_SOLUTION | RESPIRATORY_TRACT | Status: DC | PRN
  Administered 2021-02-11 (×2): 2.5 mg via RESPIRATORY_TRACT
  Filled 2021-02-09 (×2): qty 3

## 2021-02-09 MED ORDER — SODIUM CHLORIDE 0.9% FLUSH
3.0000 mL | Freq: Two times a day (BID) | INTRAVENOUS | Status: DC
  Administered 2021-02-09 – 2021-02-22 (×25): 3 mL via INTRAVENOUS

## 2021-02-09 MED ORDER — INSULIN ASPART 100 UNIT/ML IJ SOLN
4.0000 [IU] | Freq: Three times a day (TID) | INTRAMUSCULAR | Status: DC
  Administered 2021-02-09 – 2021-02-18 (×23): 4 [IU] via SUBCUTANEOUS
  Filled 2021-02-09 (×24): qty 1

## 2021-02-09 MED ORDER — ASCORBIC ACID 500 MG PO TABS
500.0000 mg | ORAL_TABLET | Freq: Two times a day (BID) | ORAL | Status: DC
  Administered 2021-02-09 – 2021-02-22 (×26): 500 mg via ORAL
  Filled 2021-02-09 (×26): qty 1

## 2021-02-09 MED ORDER — CLONIDINE HCL 0.1 MG PO TABS
0.2000 mg | ORAL_TABLET | Freq: Two times a day (BID) | ORAL | Status: DC
  Administered 2021-02-09 – 2021-02-22 (×27): 0.2 mg via ORAL
  Filled 2021-02-09 (×27): qty 2

## 2021-02-09 MED ORDER — CALCITRIOL 0.25 MCG PO CAPS
0.2500 ug | ORAL_CAPSULE | Freq: Every day | ORAL | Status: DC
  Administered 2021-02-09 – 2021-02-22 (×14): 0.25 ug via ORAL
  Filled 2021-02-09 (×16): qty 1

## 2021-02-09 MED ORDER — CALCIUM CARBONATE 1250 (500 CA) MG PO TABS
500.0000 mg | ORAL_TABLET | Freq: Every day | ORAL | Status: DC
  Administered 2021-02-10 – 2021-02-22 (×11): 500 mg via ORAL
  Filled 2021-02-09 (×14): qty 1

## 2021-02-09 MED ORDER — ONDANSETRON HCL 4 MG/2ML IJ SOLN
4.0000 mg | Freq: Four times a day (QID) | INTRAMUSCULAR | Status: DC | PRN
  Administered 2021-02-10: 15:00:00 4 mg via INTRAVENOUS
  Filled 2021-02-09: qty 2

## 2021-02-09 MED ORDER — VITAMIN D (ERGOCALCIFEROL) 1.25 MG (50000 UNIT) PO CAPS
50000.0000 [IU] | ORAL_CAPSULE | ORAL | Status: DC
  Administered 2021-02-11 – 2021-02-18 (×2): 50000 [IU] via ORAL
  Filled 2021-02-09 (×3): qty 1

## 2021-02-09 MED ORDER — APIXABAN 5 MG PO TABS: 5.0000 mg | ORAL_TABLET | Freq: Two times a day (BID) | ORAL | Status: DC

## 2021-02-09 MED ORDER — HEPARIN SODIUM (PORCINE) 5000 UNIT/ML IJ SOLN
5000.0000 [IU] | Freq: Three times a day (TID) | INTRAMUSCULAR | Status: DC
Start: 2021-02-09 — End: 2021-02-15
  Administered 2021-02-09 – 2021-02-15 (×18): 5000 [IU] via SUBCUTANEOUS
  Filled 2021-02-09 (×18): qty 1

## 2021-02-09 MED ORDER — POTASSIUM CHLORIDE CRYS ER 20 MEQ PO TBCR
20.0000 meq | EXTENDED_RELEASE_TABLET | Freq: Every day | ORAL | Status: DC
  Administered 2021-02-09 – 2021-02-13 (×5): 20 meq via ORAL
  Filled 2021-02-09 (×6): qty 1

## 2021-02-09 MED ORDER — INSULIN ASPART 100 UNIT/ML IJ SOLN
0.0000 [IU] | Freq: Three times a day (TID) | INTRAMUSCULAR | Status: DC
  Administered 2021-02-09: 17:00:00 3 [IU] via SUBCUTANEOUS
  Administered 2021-02-10 – 2021-02-11 (×4): 2 [IU] via SUBCUTANEOUS
  Administered 2021-02-12: 3 [IU] via SUBCUTANEOUS
  Administered 2021-02-12 – 2021-02-13 (×3): 2 [IU] via SUBCUTANEOUS
  Administered 2021-02-13: 5 [IU] via SUBCUTANEOUS
  Administered 2021-02-13 – 2021-02-14 (×2): 2 [IU] via SUBCUTANEOUS
  Administered 2021-02-15: 3 [IU] via SUBCUTANEOUS
  Administered 2021-02-15: 8 [IU] via SUBCUTANEOUS
  Administered 2021-02-15 – 2021-02-16 (×4): 3 [IU] via SUBCUTANEOUS
  Administered 2021-02-17: 2 [IU] via SUBCUTANEOUS
  Administered 2021-02-17: 8 [IU] via SUBCUTANEOUS
  Administered 2021-02-18: 2 [IU] via SUBCUTANEOUS
  Administered 2021-02-18 (×2): 3 [IU] via SUBCUTANEOUS
  Administered 2021-02-19: 6 [IU] via SUBCUTANEOUS
  Administered 2021-02-19 – 2021-02-20 (×3): 3 [IU] via SUBCUTANEOUS
  Administered 2021-02-20: 5 [IU] via SUBCUTANEOUS
  Administered 2021-02-20: 2 [IU] via SUBCUTANEOUS
  Administered 2021-02-21 (×2): 3 [IU] via SUBCUTANEOUS
  Administered 2021-02-22: 2 [IU] via SUBCUTANEOUS
  Administered 2021-02-22: 3 [IU] via SUBCUTANEOUS
  Filled 2021-02-09 (×31): qty 1

## 2021-02-09 MED ORDER — SODIUM CHLORIDE 0.9 % IV SOLN: 250.0000 mL | INTRAVENOUS | Status: DC | PRN

## 2021-02-09 MED ORDER — ATORVASTATIN CALCIUM 20 MG PO TABS
20.0000 mg | ORAL_TABLET | Freq: Every day | ORAL | Status: DC
  Administered 2021-02-09 – 2021-02-21 (×13): 20 mg via ORAL
  Filled 2021-02-09 (×13): qty 1

## 2021-02-09 MED ORDER — SODIUM CHLORIDE 0.9% FLUSH
3.0000 mL | INTRAVENOUS | Status: DC | PRN
  Administered 2021-02-19: 3 mL via INTRAVENOUS

## 2021-02-09 MED ORDER — MELATONIN 5 MG PO TABS
10.0000 mg | ORAL_TABLET | Freq: Every day | ORAL | Status: DC
  Administered 2021-02-09 – 2021-02-21 (×13): 10 mg via ORAL
  Filled 2021-02-09 (×13): qty 2

## 2021-02-09 MED ORDER — LEVOTHYROXINE SODIUM 112 MCG PO TABS
112.0000 ug | ORAL_TABLET | Freq: Every day | ORAL | Status: DC
  Administered 2021-02-10 – 2021-02-22 (×13): 112 ug via ORAL
  Filled 2021-02-09 (×16): qty 1

## 2021-02-09 MED ORDER — FUROSEMIDE 10 MG/ML IJ SOLN
80.0000 mg | Freq: Once | INTRAMUSCULAR | Status: AC
  Administered 2021-02-09: 12:00:00 80 mg via INTRAVENOUS
  Filled 2021-02-09: qty 8

## 2021-02-09 MED ORDER — ACETAMINOPHEN 325 MG PO TABS
650.0000 mg | ORAL_TABLET | ORAL | Status: DC | PRN
  Administered 2021-02-11 – 2021-02-20 (×7): 650 mg via ORAL
  Filled 2021-02-09 (×7): qty 2

## 2021-02-09 MED ORDER — FUROSEMIDE 10 MG/ML IJ SOLN
40.0000 mg | Freq: Four times a day (QID) | INTRAMUSCULAR | Status: DC
  Administered 2021-02-09 – 2021-02-10 (×3): 40 mg via INTRAVENOUS
  Filled 2021-02-09 (×3): qty 4

## 2021-02-09 NOTE — ED Notes (Signed)
Echo finished. Pt assisted up to bedside commode as pt stated needed to provide urine sample before taking daily meds. Will send sample once pt done and give meds.

## 2021-02-09 NOTE — Assessment & Plan Note (Addendum)
-   Hold furosemide

## 2021-02-09 NOTE — Assessment & Plan Note (Addendum)
Continue levothyroxine 

## 2021-02-09 NOTE — H&P (Addendum)
Nicole Schmidt is an 55 y.o. female.   Chief Complaint:  Chief Complaint  Patient presents with   Sore Throat   Shortness of Breath   HPI:  Pleasant 55 year old female presented to emergency department with complaint of sore throat/shortness of breath, increasing over past several days prior to presentation.  Was concerned about possible bronchitis.  Patient reports she usually sleeps in a recliner, has noticed breathing a bit worse but has not tried to lay flat.  Patient reports she has consumed a bit more salt recently and possibly more fluid than usual since she has been feeling ill, but she reports adherence to home medication regimen. Is non-smoker/never-smoker, previous occupational respiratory exposure and at some point was told she may have COPD but no formal diagnosis of this, she does see pulmonary for chronic respiratory failure requiring 2L O2 via [place] at home, and OSA.   Hospital course: In ED 02/08/2021 to 02/09/2021: Found to be fluid overloaded and AKI: Creatinine elevated to 4.59 baseline 1.7-2.4, elevated BNP at 421 baseline 150-200, negative respiratory panel and negative group A strep, troponins negative, procalcitonin normal range, x-ray concerning for bilateral mid/lower lung infiltrates, lower extremity edema.  EKG showed rate controlled A. fib. ED placed IV to start Lasix first dose around time of admission at 12:30 PM on 12/28.   Admission 02/09/2021: See H&P assessment and plan for full details.  Plan to treat for cardiorenal syndrome with gentle diuresis, fluid restriction.  Obtain echocardiogram and renal ultrasound.  Added on labs to ED serum and urine: Calculate FENa/FEUr, added on TSH and A1c.  Repeat CBC, BNP, CMP in a.m. to assess for appropriate hemoconcentration, expecting and possibly some worsening in renal function.  Initiated inhaler regimen for possible COPD but feel primary issue is fluid overload/CHF exacerbation. Last outpatient nephrology note 12/2020 notes some  progressive loss renal fxn.    Assessment/Plan  Cardiorenal syndrome, stage 1-4 or unspecified chronic kidney disease, with heart failure (HCC) gentle diuresis, fluid restriction, strict I&O echocardiogram pending renal ultrasound pending Added on labs to ED serum and urine: Calculate FENa/FEUr, added on TSH and A1c.   Repeat CBC, BNP, CMP in a.m. to assess for appropriate hemoconcentration, expecting possibly some worsening in renal function.  Consider cardiology consult/nephrology consult based on clinical picture as patient receives treatment. Last outpatient nephrology note 12/2020 notes some progressive loss renal fxn.  Also initiated inhaler regimen for possible COPD/bronchitis but feel primary issue is fluid overload/CHF exacerbation.   Held ACE/ARB, avoid other nephrotoxic medications/contrast if possible Held home Eliquis, transition to heparin while inpatient/pending renal improvement  Chronic respiratory failure with hypoxia (HCC) no known COPD, consider outpatient follow-up with PFT if not done recently.  initiated inhaler regimen for possible COPD/bronchitis but feel primary issue is fluid overload/CHF exacerbation.    Acquired hypothyroidism Uncertain baseline, added on TSH to ED blood draw, if not dramatically abnormal would continue current medications but if significant hypothyroidism contributing to CHF may need to adjust medication  Diabetes mellitus type 2 in obese ([place]) Hold home GLP-1, placed on sliding scale insulin unknown baseline A1c, added on A1c to initial blood draw  Dyslipidemia Continue statin  Atrial fibrillation, chronic (HCC) Stable, continue beta-blocker  Held Eliquis, transition to heparin  History of pulmonary embolus (PE) Low suspicion for recurrence, patient reports compliance with home anticoagulation and should avoid nephrotoxic contrast, would consider VQ scan if worse.  Iron deficiency anemia Monitor CBC  OSA on CPAP CPAP ordered  nightly  Goals of  care, counseling/discussion Patient is FULL CODE, states she would not want to be put on life support indefinitely.  She is not married/separated.  She has 2 cousins, Daine Floras and Marlowe Kays, that she would trust to make medical decisions for her and she states their information should be on file.  I had this conversation 02/09/2021 approximately 12 PM  VTE prophylaxis: heparin d/t renal compromise  CODE: FULL Family communication: none, patient is alert, communicating w/ cousins, no requests for reaching out to anyone else Dispo: expected home after 2-3 midnights depending on response to treatment      Past Medical History:  Diagnosis Date   (HFpEF) heart failure with preserved ejection fraction ([place])    a. 10/2018 Echo: EF 60-65%, diast dysfxn, RVSP 50.7 mmHg, mildly dil LA.   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 Er/PR pos, her 2 negative.  Margins 1 mm.   CHF (congestive heart failure) (HCC)    CKD (chronic kidney disease), stage III (HCC)    Diabetes mellitus type 2 in obese ([place]) 02/09/2020   Diabetes mellitus without complication ([place])    Dyslipidemia 02/09/2020   Essential hypertension    Gout    Hypothyroidism    Menopause    age 56   Morbid obesity ([place])    Persistent atrial fibrillation ([place])    a. Dx 10/2018 in setting of PNA. CHA2DS2VASc = 4-->Eliquis; b. 11/2018 s/p successful DCCV.   Personal history of radiation therapy    Pulmonary embolism ([place]) 10/2014   a. Chronic eliquis.   Thyroid goiter     Past Surgical History:  Procedure Laterality Date   BREAST BIOPSY Right 2014   breast ca   BREAST EXCISIONAL BIOPSY Right 07/19/2012   breast ca   BREAST SURGERY Right 2014   with sentinel node bx subareaolar duct excision   CARDIOVERSION N/A 11/28/2018    Procedure: CARDIOVERSION;  Surgeon: Minna Merritts, MD;  Location: [facility] ORS;  Service: Cardiovascular;  Laterality: N/A;   COLONOSCOPY WITH PROPOFOL N/A 01/30/2017   Procedure: COLONOSCOPY WITH PROPOFOL;  Surgeon: Christene Lye, MD;  Location: [facility] ENDOSCOPY;  Service: Endoscopy;  Laterality: N/A;    Family History  Problem Relation Age of Onset   Diabetes Father    Hypertension Father    Parkinson's disease Father    Hypertension Mother    Rheum arthritis Mother    Atrial fibrillation Mother    Heart failure Mother    Heart attack Mother        died in her mid-70's.   Colon cancer Cousin 65   Breast cancer Neg Hx    Social History:  reports that she has never smoked. She has never used smokeless tobacco. She reports that she does not drink alcohol and does not use drugs.  Allergies: No Known Allergies  (Not in a hospital admission)   Results for orders placed or performed during the hospital encounter of 02/09/21 (from the past 48 hour(s))  Resp Panel by RT-PCR (Flu A&B, Covid) Throat     Status: None   Collection Time: 02/08/21 10:21 PM   Specimen: Throat; Nasopharyngeal(NP) swabs in vial transport medium  Result Value Ref Range   SARS Coronavirus 2 by RT PCR NEGATIVE NEGATIVE    Comment: (NOTE) SARS-CoV-2  target nucleic acids are NOT DETECTED.  The SARS-CoV-2 RNA is generally detectable in upper respiratory specimens during the acute phase of infection. The lowest concentration of SARS-CoV-2 viral copies this assay can detect is 138 copies/mL. A negative result does not preclude SARS-Cov-2 infection and should not be used as the sole basis for treatment or other patient management decisions. A negative result may occur with  improper specimen collection/handling, submission of specimen other than nasopharyngeal swab, presence of viral mutation(s) within the areas targeted by this assay, and inadequate number of viral copies(<138 copies/mL). A negative result  must be combined with clinical observations, patient history, and epidemiological information. The expected result is Negative.  Fact Sheet for Patients:  EntrepreneurPulse.com.au  Fact Sheet for Healthcare Providers:  IncredibleEmployment.be  This test is no t yet approved or cleared by the [place] FDA and  has been authorized for detection and/or diagnosis of SARS-CoV-2 by FDA under an Emergency Use Authorization (EUA). This EUA will remain  in effect (meaning this test can be used) for the duration of the COVID-19 declaration under Section 564(b)(1) of the Act, 21 U.S.C.section 360bbb-3(b)(1), unless the authorization is terminated  or revoked sooner.       Influenza A by PCR NEGATIVE NEGATIVE   Influenza B by PCR NEGATIVE NEGATIVE    Comment: (NOTE) The Xpert Xpress SARS-CoV-2/FLU/RSV plus assay is intended as an aid in the diagnosis of influenza from Nasopharyngeal swab specimens and should not be used as a sole basis for treatment. Nasal washings and aspirates are unacceptable for Xpert Xpress SARS-CoV-2/FLU/RSV testing.  Fact Sheet for Patients: EntrepreneurPulse.com.au  Fact Sheet for Healthcare Providers: IncredibleEmployment.be  This test is not yet approved or cleared by the [place] FDA and has been authorized for detection and/or diagnosis of SARS-CoV-2 by FDA under an Emergency Use Authorization (EUA). This EUA will remain in effect (meaning this test can be used) for the duration of the COVID-19 declaration under Section 564(b)(1) of the Act, 21 U.S.C. section 360bbb-3(b)(1), unless the authorization is terminated or revoked.  Performed at [facility], [place], [place] [address]   Group A Strep by PCR [facility] Only)     Status: None   Collection Time: 02/08/21 10:21 PM   Specimen: Throat; Sterile Swab  Result Value Ref Range   Group A Strep by PCR  NOT DETECTED NOT DETECTED    Comment: Performed at [facility], [address] [place], [place] [address]  Basic metabolic panel     Status: Abnormal   Collection Time: 02/08/21 10:39 PM  Result Value Ref Range   Sodium 139 135 - 145 mmol/L   Potassium 4.7 3.5 - 5.1 mmol/L   Chloride 105 98 - 111 mmol/L   CO2 25 22 - 32 mmol/L   Glucose, Bld 156 (H) 70 - 99 mg/dL    Comment: Glucose reference range applies only to samples taken after fasting for at least 8 hours.   BUN 84 (H) 6 - 20 mg/dL   Creatinine, Ser 4.59 (H) 0.44 - 1.00 mg/dL   Calcium 9.2 8.9 - 10.3 mg/dL   GFR, Estimated 11 (L) >60 mL/min    Comment: (NOTE) Calculated using the CKD-EPI Creatinine Equation (2021)    Anion gap 9 5 - 15    Comment: Performed at [facility], [address] [place], [place] [address]  CBC     Status: Abnormal   Collection Time: 02/08/21 10:39 PM  Result Value Ref Range  WBC 13.4 (H) 4.0 - 10.5 K/uL   RBC 3.91 3.87 - 5.11 MIL/uL   Hemoglobin 11.0 (L) 12.0 - 15.0 g/dL   HCT 36.5 36.0 - 46.0 %   MCV 93.4 80.0 - 100.0 fL   MCH 28.1 26.0 - 34.0 pg   MCHC 30.1 30.0 - 36.0 g/dL   RDW 15.2 11.5 - 15.5 %   Platelets 234 150 - 400 K/uL   nRBC 0.0 0.0 - 0.2 %    Comment: Performed at [facility], [place], [place] [address]  Troponin I (High Sensitivity)     Status: None   Collection Time: 02/08/21 10:39 PM  Result Value Ref Range   Troponin I (High Sensitivity) 8 <18 ng/L    Comment: (NOTE) Elevated high sensitivity troponin I (hsTnI) values and significant  changes across serial measurements may suggest ACS but many other  chronic and acute conditions are known to elevate hsTnI results.  Refer to the "Links" section for chest pain algorithms and additional  guidance. Performed at [facility], [address] [place], [place] [address]   Brain natriuretic peptide     Status: Abnormal   Collection Time: 02/08/21 10:39 PM  Result  Value Ref Range   B Natriuretic Peptide 421.0 (H) 0.0 - 100.0 pg/mL    Comment: Performed at [facility], [place], [place] [address]  Troponin I (High Sensitivity)     Status: None   Collection Time: 02/09/21  3:04 AM  Result Value Ref Range   Troponin I (High Sensitivity) 8 <18 ng/L    Comment: (NOTE) Elevated high sensitivity troponin I (hsTnI) values and significant  changes across serial measurements may suggest ACS but many other  chronic and acute conditions are known to elevate hsTnI results.  Refer to the "Links" section for chest pain algorithms and additional  guidance. Performed at [facility], [address] [place], [place] [address]   Procalcitonin - Baseline     Status: None   Collection Time: 02/09/21 10:39 PM  Result Value Ref Range   Procalcitonin <0.10 ng/mL    Comment: Performed at [facility], [address] [place], [place] [address]   DG Chest 2 View  Result Date: 02/08/2021 CLINICAL DATA:  Shortness of breath. Sneezing and scratchy throat since Friday. History of pneumonia. EXAM: CHEST - 2 VIEW COMPARISON:  03/16/2020 FINDINGS: Mild cardiac enlargement. Shallow inspiration. Developing linear infiltration in both mid and lower lung zones. Consider multifocal pneumonia versus atelectasis. Edema less likely to have this appearance. No pleural effusions. No pneumothorax. Mediastinal contours appear intact. Calcification of the aorta. Degenerative changes in the spine and shoulders. IMPRESSION: Developing infiltrates in both mid and lower lung zones may represent pneumonia versus atelectasis. Electronically Signed   By: Lucienne Capers M.D.   On: 02/08/2021 23:07    Review of Systems  Constitutional:  Positive for appetite change, fatigue and unexpected weight change.  HENT:  Positive for congestion, rhinorrhea, sinus pressure, sinus pain and sore throat. Negative for trouble swallowing.   Respiratory:  Positive for  cough, chest tightness, shortness of breath and wheezing. Negative for choking.   Cardiovascular:  Positive for leg swelling. Negative for chest pain and palpitations.  Gastrointestinal:  Negative for abdominal distention, abdominal pain, blood in stool, constipation, diarrhea, nausea and vomiting.  Endocrine: Negative for cold intolerance, heat intolerance and polyuria.  Genitourinary:  Negative for frequency and urgency.  Musculoskeletal:  Negative for gait problem.  Skin:  Negative for color change.  Neurological:  Negative for dizziness, light-headedness, numbness and headaches.  Psychiatric/Behavioral:  Negative for confusion.    Blood pressure 138/84, pulse 85, temperature 98.2 F (36.8 C), temperature source Oral, resp. rate 18, height 5\' 4"  (1.626 m), weight (!) 159.2 kg, last menstrual period 07/17/2012, SpO2 96 %. Physical Exam Constitutional:      Appearance: She is well-developed. She is obese.  HENT:     Head: Normocephalic and atraumatic.     Mouth/Throat:     Mouth: Mucous membranes are moist.  Cardiovascular:     Rate and Rhythm: Normal rate. Rhythm irregular.     Heart sounds: Murmur heard.    No friction rub. No gallop.  Pulmonary:     Effort: Pulmonary effort is normal. No respiratory distress.     Breath sounds: Wheezing and rales present.  Abdominal:     General: Bowel sounds are normal.     Palpations: Abdomen is soft.  Musculoskeletal:     Cervical back: Neck supple.  Lymphadenopathy:     Cervical: No cervical adenopathy.  Skin:    General: Skin is warm and dry.  Neurological:     General: No focal deficit present.     Mental Status: She is alert.  Psychiatric:        Mood and Affect: Mood normal.        Behavior: Behavior normal.       Emeterio Reeve, DO 02/09/2021, 12:39 PM

## 2021-02-09 NOTE — Assessment & Plan Note (Signed)
Patient chronically on 2 L of oxygen, no known COPD, consider outpatient follow-up with PFT if not done recently

## 2021-02-09 NOTE — Assessment & Plan Note (Deleted)
Stable, continue home Eliquis

## 2021-02-09 NOTE — Assessment & Plan Note (Addendum)
Hgb stable, no clinical bleeding 

## 2021-02-09 NOTE — Assessment & Plan Note (Signed)
Patient request full code, states she would not want to be put on life support indefinitely.  She is not married/separated.  She has 2 cousins, Daine Floras and Marlowe Kays, that she would trust to make medical decisions for her and she states their information should be on file.  I had this conversation 02/09/2021 approximately 12 PM

## 2021-02-09 NOTE — Assessment & Plan Note (Addendum)
-   Continue Eliquis 

## 2021-02-09 NOTE — ED Notes (Signed)
AAOx3.  Skin warm and dry.  On 2l/ [place].  No SOB/ DOE.  NAD

## 2021-02-09 NOTE — ED Notes (Signed)
Collected daily meds and will give to pt once echo complete.

## 2021-02-09 NOTE — Assessment & Plan Note (Addendum)
Continue statin. 

## 2021-02-09 NOTE — Assessment & Plan Note (Addendum)
-   CPAP ordered nightly

## 2021-02-09 NOTE — Progress Notes (Signed)
PHARMACIST - PHYSICIAN COMMUNICATION  CONCERNING:  Heparin for DVT Prophylaxis   Filed Weights   02/08/21 2228  Weight: (!) 159.2 kg (351 lb)    Body mass index is 60.25 kg/m.  Estimated Creatinine Clearance: 20.6 mL/min (A) (by C-G formula based on SCr of 4.59 mg/dL (H)).   Plan: Start heparin 5000 units SQ every 8 hours CBC per protocol   Sherilyn Banker, PharmD Clinical Pharmacist  02/09/2021 1:17 PM

## 2021-02-09 NOTE — Assessment & Plan Note (Addendum)
-   Continue apixaban - Continue metoprolol 

## 2021-02-09 NOTE — ED Notes (Signed)
Pt given food tray with verbal okay from provider D. Smith. Another provider currently at bedside. TV adjusted per pt request. Stretcher locked low. Rail up. Call bell within reach. Bedside commode set up near stretcher.

## 2021-02-09 NOTE — ED Provider Notes (Signed)
[facility] Emergency Department Provider Note ____________________________________________   Event Date/Time   First MD Initiated Contact with Patient 02/09/21 1037     (approximate)  I have reviewed the triage vital signs and the nursing notes.  HISTORY  Chief Complaint Sore Throat and Shortness of Breath   HPI Nicole Schmidt is a 55 y.o. femalewho presents to the ED for evaluation of SOB and sore throat.   Chart review indicates morbidly obese patient with diastolic dysfunction, LVH and chronic 2 L home O2.  CKD 3, HTN, HLD and DM.  On Eliquis due to atrial fibrillation.  Patient presents to the ED for evaluation of congestion, dyspnea, orthopnea.  She reports 3 to 4 days of upper respiratory congestion, rhinorrhea and sore throat.  Alongside this, she reports developing shortness of breath, orthopnea and dyspnea on exertion over the past couple days.  She reports compliance with her medications and does note decreased urinary output and darker colored urine, without dysuria or hematuria.  She reports feeling orthopnea and difficulties breathing when she is more supine, improved when sitting upright.  Denies any chest pain, syncope, abdominal pain or emesis.  Past Medical History:  Diagnosis Date   (HFpEF) heart failure with preserved ejection fraction ([place])    a. 10/2018 Echo: EF 60-65%, diast dysfxn, RVSP 50.7 mmHg, mildly dil LA.   Acquired hypothyroidism 02/09/2020   Anemia    Arthritis    Breast cancer ([place]) 2014   right breast cancer   Breast cancer of upper-inner quadrant of right female breast ([place])    Right breast invasive CA and DCIS , 7 mm T1,N0,M0. Er/PR pos, her 2 negative.  Margins 1 mm.   CHF (congestive heart failure) (HCC)    CKD (chronic kidney disease), stage III (HCC)    Diabetes mellitus type 2 in obese ([place]) 02/09/2020   Diabetes mellitus without complication ([place])    Dyslipidemia 02/09/2020   Essential hypertension    Gout     Hypothyroidism    Menopause    age 8   Morbid obesity ([place])    Persistent atrial fibrillation ([place])    a. Dx 10/2018 in setting of PNA. CHA2DS2VASc = 4-->Eliquis; b. 11/2018 s/p successful DCCV.   Personal history of radiation therapy    Pulmonary embolism ([place]) 10/2014   a. Chronic eliquis.   Thyroid goiter     Patient Active Problem List   Diagnosis Date Noted   Cardiorenal syndrome, stage 1-4 or unspecified chronic kidney disease, with heart failure ([place]) 02/09/2021    Class: Hospitalized for   AKI (acute kidney injury) ([place]) 02/09/2021   Bronchitis 02/09/2021   Cardiorenal syndrome with renal failure 02/09/2021   Chronic respiratory failure with hypoxia ([place]) 01/13/2021   Physical deconditioning 03/16/2020   Abnormal findings on diagnostic imaging of lung 03/16/2020   AF (paroxysmal atrial fibrillation) ([place])    Acquired thrombophilia ([place])    Acute on chronic respiratory failure with hypoxia ([place]) 02/21/2020   Acute on chronic respiratory failure with hypoxemia ([place]) 02/21/2020   Atrial fibrillation, chronic ([place]) 02/09/2020   Dyslipidemia 02/09/2020   Diabetes mellitus type 2 in obese ([place]) 02/09/2020   Acquired hypothyroidism 02/09/2020   Thrush, oral 02/09/2020   Morbid obesity with BMI of 60.0-69.9, adult ([place]) 02/03/2020   Chronic anticoagulation 12/30/2019   OSA on CPAP 08/04/2019   Atrial fibrillation with RVR ([place]) 10/18/2018   Acute hypoxemic respiratory failure ([address]) 03/29/2018   Acute on chronic diastolic CHF (congestive heart failure) (  [place]) 02/04/2018   Lymphedema 02/04/2018   Essential hypertension 12/09/2017   Long term (current) use of aromatase inhibitors 11/17/2017   Elevated alkaline phosphatase level 05/20/2017   Goals of care, counseling/discussion 05/14/2017   Chronic renal disease, stage III ([place]) 07/08/2015   Iron deficiency anemia 07/08/2015   Acute renal failure superimposed on stage 3 chronic kidney disease ([place]) 10/22/2014   History of  breast cancer 10/22/2014   Elevated troponin 10/22/2014   Pulmonary emboli ([place]) 10/18/2014   History of pulmonary embolus ([place]) 10/18/2014   Candidiasis of breast 07/06/2014   Malignant neoplasm of right female breast ([place]) 07/24/2012    Past Surgical History:  Procedure Laterality Date   BREAST BIOPSY Right 2014   breast ca   BREAST EXCISIONAL BIOPSY Right 07/19/2012   breast ca   BREAST SURGERY Right 2014   with sentinel node bx subareaolar duct excision   CARDIOVERSION N/A 11/28/2018   Procedure: CARDIOVERSION;  Surgeon: Minna Merritts, MD;  Location: [facility] ORS;  Service: Cardiovascular;  Laterality: N/A;   COLONOSCOPY WITH PROPOFOL N/A 01/30/2017   Procedure: COLONOSCOPY WITH PROPOFOL;  Surgeon: Christene Lye, MD;  Location: [facility] ENDOSCOPY;  Service: Endoscopy;  Laterality: N/A;    Prior to Admission medications   Medication Sig Start Date End Date Taking? Authorizing Provider  acetaminophen (TYLENOL) 500 MG tablet Take 1,000 mg by mouth every 6 (six) hours as needed (for pain).   Yes [provider]  amLODipine (NORVASC) 5 MG tablet Take 5 mg by mouth 2 (two) times daily. 09/18/18  Yes [provider]  atorvastatin (LIPITOR) 20 MG tablet Take 20 mg by mouth at bedtime.    Yes [provider]  calcitRIOL (ROCALTROL) 0.25 MCG capsule Take 0.25 mcg by mouth daily. 05/13/20  Yes [provider]  calcium carbonate (OSCAL) 1500 (600 Ca) MG TABS tablet Take 600 mg of elemental calcium by mouth daily.    Yes [provider]  CINNAMON PO Take 1,000 mg by mouth daily.    Yes [provider]  cloNIDine (CATAPRES) 0.2 MG tablet Take 1 tablet (0.2 mg total) by mouth 2 (two) times daily. 10/21/18  Yes Mayo, Pete Pelt, MD  CRANBERRY CONCENTRATE PO Take 1 capsule by mouth daily.   Yes [provider]  ELIQUIS 5 MG TABS tablet TAKE 1 TABLET TWICE A DAY 12/27/20  Yes Sindy Guadeloupe, MD  folic acid (FOLVITE) 242 MCG tablet Take  800 mcg by mouth daily.    Yes [provider]  furosemide (LASIX) 40 MG tablet Take 1 tablet (40 mg total) by mouth daily. 02/23/20  Yes Loletha Grayer, MD  KLOR-CON M20 20 MEQ tablet TAKE 1 TABLET DAILY 10/18/20  Yes Darylene Price A, FNP  levothyroxine (SYNTHROID, LEVOTHROID) 112 MCG tablet Take 112 mcg daily before breakfast by mouth.   Yes [provider]  Melatonin 10 MG TABS Take 10 mg by mouth at bedtime.   Yes [provider]  metoprolol tartrate (LOPRESSOR) 50 MG tablet Take 1.5 tablets (75 mg total) by mouth 2 (two) times daily. 12/06/20 03/06/21 Yes Dunn, Areta Haber, PA-C  vitamin C (ASCORBIC ACID) 500 MG tablet Take 500 mg by mouth 2 (two) times daily.   Yes [provider]  albuterol (VENTOLIN HFA) 108 (90 Base) MCG/ACT inhaler Inhale 2 puffs into the lungs every 6 (six) hours as needed for wheezing or shortness of breath.    [provider]  Cholecalciferol (VITAMIN D3) 50000 units TABS Take 50,000  Units by mouth every Friday.     [provider]  Dulaglutide 1.5 MG/0.5ML SOPN Inject 1.5 mg into the skin every 7 (seven) days.    [provider]  fluticasone-salmeterol (ADVAIR HFA) 230-21 MCG/ACT inhaler Inhale 2 puffs into the lungs 2 (two) times daily as needed.  Patient not taking: No sig reported  02/09/20  [provider]    Allergies Patient has no known allergies.  Family History  Problem Relation Age of Onset   Diabetes Father    Hypertension Father    Parkinson's disease Father    Hypertension Mother    Rheum arthritis Mother    Atrial fibrillation Mother    Heart failure Mother    Heart attack Mother        died in her mid-70's.   Colon cancer Cousin 51   Breast cancer Neg Hx     Social History Social History   Tobacco Use   Smoking status: Never   Smokeless tobacco: Never  Vaping Use   Vaping Use: Never used  Substance Use Topics   Alcohol use: No    Alcohol/week: 0.0 standard drinks    Drug use: No    Review of Systems  Constitutional: No fever/chills Eyes: No visual changes. ENT: Positive for sore throat and congestion Cardiovascular: Denies chest pain. Respiratory: Positive for orthopnea, dyspnea on exertion. Gastrointestinal: No abdominal pain.  No nausea, no vomiting.  No diarrhea.  No constipation. Genitourinary: Negative for dysuria. Musculoskeletal: Negative for back pain. Skin: Negative for rash. Neurological: Negative for headaches, focal weakness or numbness.   ____________________________________________   PHYSICAL EXAM:  VITAL SIGNS: Vitals:   02/09/21 1408 02/09/21 1600  BP:  (!) 151/68  Pulse:  86  Resp:  18  Temp:    SpO2: 97% 99%     Constitutional: Alert and oriented. Well appearing and in no acute distress.  Morbidly obese.  Conversational in full sentences. Eyes: Conjunctivae are normal. PERRL. EOMI. Head: Atraumatic. Nose: No congestion/rhinnorhea. Mouth/Throat: Mucous membranes are moist.  Oropharynx non-erythematous. Neck: No stridor. No cervical spine tenderness to palpation. Cardiovascular: Normal rate, regular rhythm. Good peripheral circulation. Respiratory: Minimal tachypnea to the low 20s.  No wheezing. Gastrointestinal: Soft , nondistended, nontender to palpation.  Musculoskeletal: No joint effusions. No signs of acute trauma. Pitting edema diffusely to bilateral lower extremities up to the knees without overlying skin changes or signs of trauma. Neurologic:  Normal speech and language. No gross focal neurologic deficits are appreciated. No gait instability noted. Skin:  Skin is warm, dry and intact. No rash noted. Psychiatric: Mood and affect are normal. Speech and behavior are normal.  ____________________________________________   LABS (all labs ordered are listed, but only abnormal results are displayed)  Labs Reviewed  BASIC METABOLIC PANEL - Abnormal; Notable for the following components:      Result Value    Glucose, Bld 156 (*)    BUN 84 (*)    Creatinine, Ser 4.59 (*)    GFR, Estimated 11 (*)    All other components within normal limits  CBC - Abnormal; Notable for the following components:   WBC 13.4 (*)    Hemoglobin 11.0 (*)    All other components within normal limits  BRAIN NATRIURETIC PEPTIDE - Abnormal; Notable for the following components:   B Natriuretic Peptide 421.0 (*)    All other components within normal limits  URINALYSIS, ROUTINE W REFLEX MICROSCOPIC - Abnormal; Notable for the following components:   Color, Urine YELLOW (*)  APPearance HAZY (*)    Hgb urine dipstick MODERATE (*)    Protein, ur 30 (*)    Bacteria, UA FEW (*)    All other components within normal limits  TSH - Abnormal; Notable for the following components:   TSH 0.267 (*)    All other components within normal limits  CBG MONITORING, ED - Abnormal; Notable for the following components:   Glucose-Capillary 151 (*)    All other components within normal limits  RESP PANEL BY RT-PCR (FLU A&B, COVID) ARPGX2  GROUP A STREP BY PCR  PROCALCITONIN  SODIUM, URINE, RANDOM  CREATININE, URINE, RANDOM  HIV ANTIBODY (ROUTINE TESTING W REFLEX)  UREA NITROGEN, URINE  HEMOGLOBIN W0J  BASIC METABOLIC PANEL  BRAIN NATRIURETIC PEPTIDE  COMPREHENSIVE METABOLIC PANEL  CBC  TROPONIN I (HIGH SENSITIVITY)  TROPONIN I (HIGH SENSITIVITY)   ____________________________________________  12 Lead EKG  Atrial fibrillation, rate of 79 bpm.  Normal axis and intervals.  No STEMI. ____________________________________________  RADIOLOGY  ED MD interpretation: 2 view CXR reviewed by me with bilateral interstitial infiltrates, likely pulmonary edema  Official radiology report(s): DG Chest 2 View  Result Date: 02/08/2021 CLINICAL DATA:  Shortness of breath. Sneezing and scratchy throat since Friday. History of pneumonia. EXAM: CHEST - 2 VIEW COMPARISON:  03/16/2020 FINDINGS: Mild cardiac enlargement. Shallow inspiration.  Developing linear infiltration in both mid and lower lung zones. Consider multifocal pneumonia versus atelectasis. Edema less likely to have this appearance. No pleural effusions. No pneumothorax. Mediastinal contours appear intact. Calcification of the aorta. Degenerative changes in the spine and shoulders. IMPRESSION: Developing infiltrates in both mid and lower lung zones may represent pneumonia versus atelectasis. Electronically Signed   By: Lucienne Capers M.D.   On: 02/08/2021 23:07   ____________________________________________   PROCEDURES and INTERVENTIONS  Procedure(s) performed (including Critical Care):  .1-3 Lead EKG Interpretation Performed by: Vladimir Crofts, MD Authorized by: Vladimir Crofts, MD     Interpretation: normal     ECG rate:  80   ECG rate assessment: normal     Rhythm: atrial fibrillation     Ectopy: none     Conduction: normal    Medications  sodium chloride flush (NS) 0.9 % injection 3 mL (3 mLs Intravenous Given 02/09/21 1421)  sodium chloride flush (NS) 0.9 % injection 3 mL (has no administration in time range)  0.9 %  sodium chloride infusion (has no administration in time range)  acetaminophen (TYLENOL) tablet 650 mg (has no administration in time range)  ondansetron (ZOFRAN) injection 4 mg (has no administration in time range)  furosemide (LASIX) injection 40 mg (40 mg Intravenous Given 02/09/21 1722)  ipratropium-albuterol (DUONEB) 0.5-2.5 (3) MG/3ML nebulizer solution 3 mL (3 mLs Inhalation Given 02/09/21 1556)  albuterol (PROVENTIL) (2.5 MG/3ML) 0.083% nebulizer solution 2.5 mg (has no administration in time range)  amLODipine (NORVASC) tablet 5 mg (5 mg Oral Given 02/09/21 1325)  atorvastatin (LIPITOR) tablet 20 mg (has no administration in time range)  calcitRIOL (ROCALTROL) capsule 0.25 mcg (0.25 mcg Oral Given 02/09/21 1721)  calcium carbonate (OS-CAL - dosed in mg of elemental calcium) tablet 500 mg of elemental calcium (has no administration in  time range)  Vitamin D (Ergocalciferol) (DRISDOL) capsule 50,000 Units (has no administration in time range)  cloNIDine (CATAPRES) tablet 0.2 mg (0.2 mg Oral Given 81/19/14 7829)  folic acid (FOLVITE) tablet 1 mg (1 mg Oral Given 02/09/21 1325)  potassium chloride SA (KLOR-CON M) CR tablet 20 mEq (20 mEq Oral Given 02/09/21 1325)  levothyroxine (SYNTHROID) tablet 112 mcg (has no administration in time range)  melatonin tablet 10 mg (has no administration in time range)  metoprolol tartrate (LOPRESSOR) tablet 75 mg (75 mg Oral Given 02/09/21 1324)  ascorbic acid (VITAMIN C) tablet 500 mg (has no administration in time range)  insulin aspart (novoLOG) injection 0-15 Units (3 Units Subcutaneous Given 02/09/21 1723)  insulin aspart (novoLOG) injection 4 Units (4 Units Subcutaneous Given 02/09/21 1723)  heparin injection 5,000 Units (5,000 Units Subcutaneous Given 02/09/21 1325)  furosemide (LASIX) injection 80 mg (80 mg Intravenous Given 02/09/21 1134)    ____________________________________________   MDM / ED COURSE   55 year old female with history of CKD and diastolic dysfunction presents to the ED with respiratory symptoms, with evidence of cardiorenal syndrome requiring medical admission.  She is on her baseline 2 L O2 without evidence of superimposed acute respiratory failure.  She does appear volume overloaded between her examination and x-ray.  BNP is elevated the highest within our system.  Furthermore, her renal dysfunction has worsened significantly with nearly a doubling of her creatinine.  I see no infectious features, her procalcitonin is negative and do not see any indications for antibiotics.  No evidence of strep throat.  Anticipate a viral URI precipitating a CHF exacerbation and associated renal dysfunction for cardiorenal syndrome.  We will start diuresis here in the ED and admit to medicine.  No signs of ACS.     ____________________________________________   FINAL  CLINICAL IMPRESSION(S) / ED DIAGNOSES  Final diagnoses:  Cardiorenal syndrome with renal failure, stage 1-4 or unspecified chronic kidney disease, with heart failure (HCC)  Viral URI  Cardiorenal syndrome with renal failure     ED Discharge Orders     None        Telly Broberg   Note:  This document was prepared using Systems analyst and may include unintentional dictation errors.    Vladimir Crofts, MD 02/09/21 [phone]

## 2021-02-09 NOTE — Assessment & Plan Note (Addendum)
-   Continue glargine - Continue sliding scale corrections Glucose controlled

## 2021-02-10 DIAGNOSIS — I130 Hypertensive heart and chronic kidney disease with heart failure and stage 1 through stage 4 chronic kidney disease, or unspecified chronic kidney disease: Secondary | ICD-10-CM | POA: Diagnosis not present

## 2021-02-10 LAB — CBC
HCT: 32.6 % — ABNORMAL LOW (ref 36.0–46.0)
Hemoglobin: 9.8 g/dL — ABNORMAL LOW (ref 12.0–15.0)
MCH: 27.7 pg (ref 26.0–34.0)
MCHC: 30.1 g/dL (ref 30.0–36.0)
MCV: 92.1 fL (ref 80.0–100.0)
Platelets: 210 10*3/uL (ref 150–400)
RBC: 3.54 MIL/uL — ABNORMAL LOW (ref 3.87–5.11)
RDW: 15.3 % (ref 11.5–15.5)
WBC: 8.6 10*3/uL (ref 4.0–10.5)
nRBC: 0 % (ref 0.0–0.2)

## 2021-02-10 LAB — CBG MONITORING, ED
Glucose-Capillary: 123 mg/dL — ABNORMAL HIGH (ref 70–99)
Glucose-Capillary: 130 mg/dL — ABNORMAL HIGH (ref 70–99)
Glucose-Capillary: 117 mg/dL — ABNORMAL HIGH (ref 70–99)
Glucose-Capillary: 134 mg/dL — ABNORMAL HIGH (ref 70–99)

## 2021-02-10 LAB — GLUCOSE, CAPILLARY: Glucose-Capillary: 142 mg/dL — ABNORMAL HIGH (ref 70–99)

## 2021-02-10 LAB — COMPREHENSIVE METABOLIC PANEL
ALT: 14 U/L (ref 0–44)
AST: 12 U/L — ABNORMAL LOW (ref 15–41)
Albumin: 3.2 g/dL — ABNORMAL LOW (ref 3.5–5.0)
Alkaline Phosphatase: 89 U/L (ref 38–126)
Anion gap: 9 (ref 5–15)
BUN: 93 mg/dL — ABNORMAL HIGH (ref 6–20)
CO2: 24 mmol/L (ref 22–32)
Calcium: 8.7 mg/dL — ABNORMAL LOW (ref 8.9–10.3)
Chloride: 105 mmol/L (ref 98–111)
Creatinine, Ser: 5.34 mg/dL — ABNORMAL HIGH (ref 0.44–1.00)
GFR, Estimated: 9 mL/min — ABNORMAL LOW (ref >60)
Glucose, Bld: 166 mg/dL — ABNORMAL HIGH (ref 70–99)
Potassium: 4.3 mmol/L (ref 3.5–5.1)
Sodium: 138 mmol/L (ref 135–145)
Total Bilirubin: 0.6 mg/dL (ref 0.3–1.2)
Total Protein: 7.1 g/dL (ref 6.5–8.1)

## 2021-02-10 LAB — UREA NITROGEN, URINE: Urea Nitrogen, Ur: 281 mg/dL

## 2021-02-10 LAB — ECHOCARDIOGRAM COMPLETE: S' Lateral: 3 cm

## 2021-02-10 LAB — HEMOGLOBIN A1C
Hgb A1c MFr Bld: 7.2 % — ABNORMAL HIGH (ref 4.8–5.6)
Mean Plasma Glucose: 159.94 mg/dL

## 2021-02-10 LAB — BRAIN NATRIURETIC PEPTIDE: B Natriuretic Peptide: 221.7 pg/mL — ABNORMAL HIGH (ref 0.0–100.0)

## 2021-02-10 MED ORDER — FUROSEMIDE 40 MG PO TABS
40.0000 mg | ORAL_TABLET | Freq: Four times a day (QID) | ORAL | Status: DC
  Administered 2021-02-10 – 2021-02-11 (×5): 40 mg via ORAL
  Filled 2021-02-10 (×6): qty 1

## 2021-02-10 NOTE — ED Notes (Signed)
Elana RN aware of assigned bed

## 2021-02-10 NOTE — Progress Notes (Signed)
PROGRESS NOTE    Nicole Schmidt  NAT:[id number] DOB: [birth year] DOA: 02/09/2021 PCP: Lenard Simmer, MD    Brief Narrative:  55 year old female presented to emergency department with complaint of sore throat/shortness of breath, increasing over past several days prior to presentation.  Was concerned about possible bronchitis.  Patient reports she usually sleeps in a recliner, has noticed breathing a bit worse but has not tried to lay flat.  Patient reports she has consumed a bit more salt recently and possibly more fluid than usual since she has been feeling ill, but she reports adherence to home medication regimen. Is non-smoker/never-smoker, previous occupational respiratory exposure and at some point was told she may have COPD but no formal diagnosis of this, she does see pulmonary for chronic respiratory failure requiring 2L O2 via [place] at home, and OSA.Marland Kitchen At presentation, pt was noted to Cr over 4.5 with concerns of cardiorenal syndrome. Pt was admitted for further workup  Assessment & Plan:   Principal Problem:   Cardiorenal syndrome, stage 1-4 or unspecified chronic kidney disease, with heart failure (HCC) Active Problems:   History of pulmonary embolus (PE)   Acute renal failure superimposed on stage 3 chronic kidney disease (HCC)   Iron deficiency anemia   Goals of care, counseling/discussion   OSA on CPAP   Atrial fibrillation, chronic (HCC)   Dyslipidemia   Diabetes mellitus type 2 in obese [facility])   Acquired hypothyroidism   Physical deconditioning   Chronic respiratory failure with hypoxia (HCC)   Bronchitis  Cardiorenal syndrome, stage 1-4 or unspecified chronic kidney disease, with heart failure (HCC) gentle diuresis, fluid restriction, strict I&O echocardiogram with EF 60-65% renal ultrasound performed  Held ACE/ARB, avoid other nephrotoxic medications/contrast if possible At time of presentation, held home Eliquis, transition to heparin while inpatient/pending renal  improvement Consulted nephrology, recs to transition to PO lasix for now Cr noted to be up to over 5 this AM   Chronic respiratory failure with hypoxia ([place]) no known COPD, consider outpatient follow-up with PFT if not done recently.  Continued on PRN bronchodilators as needed   Acquired hypothyroidism TSH 0.267 Will check free T4 in AM   Diabetes mellitus type 2 in obese ([place]) Hold home GLP-1, placed on sliding scale insulin A1c 7.2   Dyslipidemia Continue statin   Atrial fibrillation, chronic (HCC) Stable, continue beta-blocker  Held Eliquis, transition to heparin   History of pulmonary embolus (PE) Low suspicion for recurrence, patient reports compliance with home anticoagulation Appeared stable at present   Iron deficiency anemia Monitor CBC   OSA on CPAP CPAP ordered nightly   DVT prophylaxis: heparin Code Status: Full Family Communication: Pt in room, family not at bedside  Status is: Inpatient  Remains inpatient appropriate because: Severity of illness     Consultants:  nephrology  Procedures:    Antimicrobials: Anti-infectives (From admission, onward)    None       Subjective: States feeling somewhat better today  Objective: Vitals:   02/10/21 1745 02/10/21 1800 02/10/21 1815 02/10/21 1823  BP:  137/69    Pulse: 88 88 94   Resp:      Temp:    98.3 F (36.8 C)  TempSrc:    Oral  SpO2: 90% 94% 92%   Weight:      Height:        Intake/Output Summary (Last 24 hours) at 02/10/2021 1826 Last data filed at 02/10/2021 1400 Gross per 24 hour  Intake 200 ml  Output  2350 ml  Net -2150 ml   Filed Weights   02/08/21 2228  Weight: (!) 159.2 kg    Examination: General exam: Awake, laying in bed, in nad Respiratory system: Normal respiratory effort, no wheezing Cardiovascular system: regular rate, s1, s2 Gastrointestinal system: Soft, nondistended, positive BS Central nervous system: CN2-12 grossly intact, strength  intact Extremities: Perfused, no clubbing Skin: Normal skin turgor, no notable skin lesions seen Psychiatry: Mood normal // no visual hallucinations   Data Reviewed: I have personally reviewed following labs and imaging studies  CBC: Recent Labs  Lab 02/08/21 2239 02/10/21 0847  WBC 13.4* 8.6  HGB 11.0* 9.8*  HCT 36.5 32.6*  MCV 93.4 92.1  PLT 234 614   Basic Metabolic Panel: Recent Labs  Lab 02/08/21 2239 02/10/21 0847  NA 139 138  K 4.7 4.3  CL 105 105  CO2 25 24  GLUCOSE 156* 166*  BUN 84* 93*  CREATININE 4.59* 5.34*  CALCIUM 9.2 8.7*   GFR: Estimated Creatinine Clearance: 17.7 mL/min (A) (by C-G formula based on SCr of 5.34 mg/dL (H)). Liver Function Tests: Recent Labs  Lab 02/10/21 0847  AST 12*  ALT 14  ALKPHOS 89  BILITOT 0.6  PROT 7.1  ALBUMIN 3.2*   No results for input(s): LIPASE, AMYLASE in the last 168 hours. No results for input(s): AMMONIA in the last 168 hours. Coagulation Profile: No results for input(s): INR, PROTIME in the last 168 hours. Cardiac Enzymes: No results for input(s): CKTOTAL, CKMB, CKMBINDEX, TROPONINI in the last 168 hours. BNP (last 3 results) No results for input(s): PROBNP in the last 8760 hours. HbA1C: Recent Labs    02/09/21 1543  HGBA1C 7.2*   CBG: Recent Labs  Lab 02/09/21 1710 02/09/21 2117 02/10/21 0717 02/10/21 1158 02/10/21 1621  GLUCAP 151* 142* 123* 130* 117*   Lipid Profile: No results for input(s): CHOL, HDL, LDLCALC, TRIG, CHOLHDL, LDLDIRECT in the last 72 hours. Thyroid Function Tests: Recent Labs    02/09/21 1543  TSH 0.267*   Anemia Panel: No results for input(s): VITAMINB12, FOLATE, FERRITIN, TIBC, IRON, RETICCTPCT in the last 72 hours. Sepsis Labs: Recent Labs  Lab 02/09/21 2239  PROCALCITON <0.10    Recent Results (from the past 240 hour(s))  Resp Panel by RT-PCR (Flu A&B, Covid) Throat     Status: None   Collection Time: 02/08/21 10:21 PM   Specimen: Throat;  Nasopharyngeal(NP) swabs in vial transport medium  Result Value Ref Range Status   SARS Coronavirus 2 by RT PCR NEGATIVE NEGATIVE Final    Comment: (NOTE) SARS-CoV-2 target nucleic acids are NOT DETECTED.  The SARS-CoV-2 RNA is generally detectable in upper respiratory specimens during the acute phase of infection. The lowest concentration of SARS-CoV-2 viral copies this assay can detect is 138 copies/mL. A negative result does not preclude SARS-Cov-2 infection and should not be used as the sole basis for treatment or other patient management decisions. A negative result may occur with  improper specimen collection/handling, submission of specimen other than nasopharyngeal swab, presence of viral mutation(s) within the areas targeted by this assay, and inadequate number of viral copies(<138 copies/mL). A negative result must be combined with clinical observations, patient history, and epidemiological information. The expected result is Negative.  Fact Sheet for Patients:  EntrepreneurPulse.com.au  Fact Sheet for Healthcare Providers:  IncredibleEmployment.be  This test is no t yet approved or cleared by the [place] FDA and  has been authorized for detection and/or diagnosis of SARS-CoV-2 by FDA under  an Emergency Use Authorization (EUA). This EUA will remain  in effect (meaning this test can be used) for the duration of the COVID-19 declaration under Section 564(b)(1) of the Act, 21 U.S.C.section 360bbb-3(b)(1), unless the authorization is terminated  or revoked sooner.       Influenza A by PCR NEGATIVE NEGATIVE Final   Influenza B by PCR NEGATIVE NEGATIVE Final    Comment: (NOTE) The Xpert Xpress SARS-CoV-2/FLU/RSV plus assay is intended as an aid in the diagnosis of influenza from Nasopharyngeal swab specimens and should not be used as a sole basis for treatment. Nasal washings and aspirates are unacceptable for Xpert Xpress  SARS-CoV-2/FLU/RSV testing.  Fact Sheet for Patients: EntrepreneurPulse.com.au  Fact Sheet for Healthcare Providers: IncredibleEmployment.be  This test is not yet approved or cleared by the [place] FDA and has been authorized for detection and/or diagnosis of SARS-CoV-2 by FDA under an Emergency Use Authorization (EUA). This EUA will remain in effect (meaning this test can be used) for the duration of the COVID-19 declaration under Section 564(b)(1) of the Act, 21 U.S.C. section 360bbb-3(b)(1), unless the authorization is terminated or revoked.  Performed at [facility], [place], [place] [address]   Group A Strep by PCR [facility] Only)     Status: None   Collection Time: 02/08/21 10:21 PM   Specimen: Throat; Sterile Swab  Result Value Ref Range Status   Group A Strep by PCR NOT DETECTED NOT DETECTED Final    Comment: Performed at [facility], [address] [place], [place] [address]     Radiology Studies: DG Chest 2 View  Result Date: 02/08/2021 CLINICAL DATA:  Shortness of breath. Sneezing and scratchy throat since Friday. History of pneumonia. EXAM: CHEST - 2 VIEW COMPARISON:  03/16/2020 FINDINGS: Mild cardiac enlargement. Shallow inspiration. Developing linear infiltration in both mid and lower lung zones. Consider multifocal pneumonia versus atelectasis. Edema less likely to have this appearance. No pleural effusions. No pneumothorax. Mediastinal contours appear intact. Calcification of the aorta. Degenerative changes in the spine and shoulders. IMPRESSION: Developing infiltrates in both mid and lower lung zones may represent pneumonia versus atelectasis. Electronically Signed   By: Lucienne Capers M.D.   On: 02/08/2021 23:07   US RENAL  Result Date: 02/09/2021 CLINICAL DATA:  A 55 year old female presents for evaluation of renal failure EXAM: RENAL / URINARY TRACT ULTRASOUND COMPLETE COMPARISON:   Renal sonogram from 2019. FINDINGS: Right Kidney: Renal measurements: 11.0 x 6.0 x 5.0 cm = volume: 170 mL. Marked increased echogenicity. No hydronephrosis. Limited assessment due to patient body habitus. Small cyst in the RIGHT kidney measuring 1.5 x 1.2 x 1.2 cm. Left Kidney: Renal measurements: 13.5 x 6.2 x 5.2 cm = volume: 227 mL. Increased cortical echogenicity on the LEFT as well. Dilation of the LEFT renal pelvis and infundibula. Limited assessment due to patient body habitus. Trace perinephric fluid on the LEFT. Bladder: Appears normal for degree of bladder distention. Other: No ascites. IMPRESSION: Echogenic renal parenchyma, this could be seen in the setting of medical renal disease. Trace perinephric fluid on the LEFT could also be seen in this context. Mild LEFT hydronephrosis with dilation of renal pelvis and infundibular dilation, difficult to fully evaluate due to patient body habitus. Correlate with any LEFT-sided symptoms and with CT imaging as warranted. Electronically Signed   By: Zetta Bills M.D.   On: 02/09/2021 13:27   ECHOCARDIOGRAM COMPLETE  Result Date: 02/10/2021    ECHOCARDIOGRAM REPORT   Patient  Name:   [name] Date of Exam: 02/09/2021 Medical Rec #:  [id number]      Height:       64.0 in Accession #:    [id number]     Weight:       351.0 lb Date of Birth:  May 19, 1965       BSA:          2.485 m Patient Age:    26 years       BP:           136/71 mmHg Patient Gender: F              HR:           84 bpm. Exam Location:  [facility] Procedure: 2D Echo, Cardiac Doppler, Color Doppler and Intracardiac            Opacification Agent Indications:     F81.01 Acute Diastolic CHF  History:         Patient has prior history of Echocardiogram examinations, most                  recent 02/09/2020. CHF, Arrythmias:Atrial Fibrillation; Risk                  Factors:Hypertension, Diabetes, Dyslipidemia and Morbid                  Obesity. Hypothyroidism. Chronic kidney disease. Pulmonary                   embolism.  Sonographer:     Cresenciano Lick RDCS Referring Phys:  [id number] Emeterio Reeve Diagnosing Phys: Nelva Bush MD IMPRESSIONS  1. Left ventricular ejection fraction, by estimation, is 60 to 65%. The left ventricle has normal function. Left ventricular endocardial border not optimally defined to evaluate regional wall motion. There is moderate left ventricular hypertrophy. Left ventricular diastolic function could not be evaluated.  2. Right ventricular systolic function is normal. The right ventricular size is mildly enlarged.  3. Left atrial size was moderately dilated.  4. Right atrial size was moderately dilated.  5. The mitral valve is normal in structure. Trivial mitral valve regurgitation.  6. The aortic valve has an indeterminant number of cusps. Aortic valve regurgitation is not visualized. No aortic stenosis is present.  7. The inferior vena cava is normal in size with greater than 50% respiratory variability, suggesting right atrial pressure of 3 mmHg. FINDINGS  Left Ventricle: Left ventricular ejection fraction, by estimation, is 60 to 65%. The left ventricle has normal function. Left ventricular endocardial border not optimally defined to evaluate regional wall motion. Definity contrast agent was given IV to delineate the left ventricular endocardial borders. The left ventricular internal cavity size was normal in size. There is moderate left ventricular hypertrophy. Left ventricular diastolic function could not be evaluated due to atrial fibrillation. Left ventricular diastolic function could not be evaluated. Right Ventricle: The right ventricular size is mildly enlarged. No increase in right ventricular wall thickness. Right ventricular systolic function is normal. Left Atrium: Left atrial size was moderately dilated. Right Atrium: Right atrial size was moderately dilated. Pericardium: Trivial pericardial effusion is present. Mitral Valve: The mitral valve is normal in  structure. Trivial mitral valve regurgitation. Tricuspid Valve: The tricuspid valve is normal in structure. Tricuspid valve regurgitation is trivial. Aortic Valve: The aortic valve has an indeterminant number of cusps. Aortic valve regurgitation is not visualized. No aortic stenosis is present. Pulmonic Valve: The pulmonic valve  was not well visualized. Pulmonic valve regurgitation is not visualized. No evidence of pulmonic stenosis. Aorta: The aortic root is normal in size and structure. Pulmonary Artery: The pulmonary artery is not well seen. Venous: The inferior vena cava is normal in size with greater than 50% respiratory variability, suggesting right atrial pressure of 3 mmHg. IAS/Shunts: The interatrial septum was not well visualized.  LEFT VENTRICLE PLAX 2D LVIDd:         4.80 cm LVIDs:         3.00 cm LV PW:         1.50 cm LV IVS:        1.50 cm LVOT diam:     2.20 cm LV SV:         75 LV SV Index:   30 LVOT Area:     3.80 cm  RIGHT VENTRICLE             IVC RV Basal diam:  4.40 cm     IVC diam: 1.50 cm RV S prime:     12.80 cm/s TAPSE (M-mode): 2.4 cm LEFT ATRIUM           Index        RIGHT ATRIUM           Index LA diam:      5.20 cm 2.09 cm/m   RA Area:     22.50 cm LA Vol (A4C): 75.1 ml 30.23 ml/m  RA Volume:   71.80 ml  28.90 ml/m  AORTIC VALVE LVOT Vmax:   102.95 cm/s LVOT Vmean:  77.850 cm/s LVOT VTI:    0.196 m  AORTA Ao Root diam: 3.00 cm MV E velocity: 123.67 cm/s                             SHUNTS                             Systemic VTI:  0.20 m                             Systemic Diam: 2.20 cm Nelva Bush MD Electronically signed by Nelva Bush MD Signature Date/Time: 02/10/2021/7:15:21 AM    Final     Scheduled Meds:  amLODipine  5 mg Oral BID   vitamin C  500 mg Oral BID   atorvastatin  20 mg Oral QHS   calcitRIOL  0.25 mcg Oral Daily   calcium carbonate  500 mg of elemental calcium Oral Daily   cloNIDine  0.2 mg Oral BID   folic acid  4,401 mcg Oral Daily    furosemide  40 mg Oral QID   heparin injection (subcutaneous)  5,000 Units Subcutaneous Q8H   insulin aspart  0-15 Units Subcutaneous TID WC   insulin aspart  4 Units Subcutaneous TID WC   ipratropium-albuterol  3 mL Inhalation Q6H   levothyroxine  112 mcg Oral QAC breakfast   melatonin  10 mg Oral QHS   metoprolol tartrate  75 mg Oral BID   potassium chloride SA  20 mEq Oral Daily   sodium chloride flush  3 mL Intravenous Q12H   [START ON 02/11/2021] Vitamin D (Ergocalciferol)  50,000 Units Oral Q Fri   Continuous Infusions:  sodium chloride       LOS: 1 day   Marylu Lund, MD  [facility] Pager On [place]  If 7PM-7AM, please contact night-coverage 02/10/2021, 6:26 PM

## 2021-02-10 NOTE — ED Notes (Signed)
Pt. Has not received dinner yet, will wait for pt. To eat to give insulin, 4 units.

## 2021-02-10 NOTE — ED Notes (Signed)
When pt. Falls asleep, O2 sat drops to 88%. Pt. Placed on 3L while sleeping

## 2021-02-10 NOTE — ED Notes (Signed)
Pt off cardiac monitor so that she can sit in recliner. Pt denies any further needs.

## 2021-02-10 NOTE — Progress Notes (Signed)
[facility]  ROUNDING NOTE   Subjective:   Nicole Schmidt is a 55 year old female with a past medical history of anemia, hypertension, dyslipidemia, diabetes, CHF, and chronic kidney disease.  Patient presents to the hospital with complaints of shortness of breath and sore throat which started late last week.  Patient has been admitted for Cardiorenal syndrome with renal failure [I13.10]  Patient is known to our practice and is followed by Dr. Holley Raring.  Patient was last seen in our office in November 2022.  States she has a recurrent history of pneumonia and bronchitis, and this sore throat and sneezing is usually how it begins.  She says she attempted to schedule an appointment with PCP, but was told he was out of office due to illness.  Denies nausea, vomiting, and diarrhea.  Normally wears supplemental oxygen at home, baseline 2 L.  Currently on 2 L.  Complains of intermittent cough, nonproductive.  Denies chest pain, fever, and chills.  Pertinent labs on arrival include glucose 156, BUN 84, creatinine 4.59 with GFR of 11 and WBCs 13.4.  Chest x-ray shows developing infiltrates bilaterally to represent pneumonia versus atelectasis.   Objective:  Vital signs in last 24 hours:  Pulse Rate:  [83-100] 90 (12/29 1008) Resp:  [18-20] 18 (12/29 1008) BP: (118-152)/(68-78) 152/77 (12/29 1008) SpO2:  [93 %-99 %] 95 % (12/29 1008)  Weight change:  Filed Weights   02/08/21 2228  Weight: (!) 159.2 kg    Intake/Output: I/O last 3 completed shifts: In: 200 [P.O.:200] Out: 1150 [Urine:1150]   Intake/Output this shift:  Total I/O In: -  Out: 900 [Urine:900]  Physical Exam: General: NAD, sitting up in recliner  Head: Normocephalic, atraumatic. Moist oral mucosal membranes  Eyes: Anicteric  Neck: Supple, trachea midline  Lungs:  Clear to auscultation, normal effort, 2 L O2  Heart: Regular rate and rhythm  Abdomen:  Soft, nontender  Extremities:  1+ peripheral edema.   Neurologic: Nonfocal, moving all four extremities  Skin: No lesions       Basic Metabolic Panel: Recent Labs  Lab 02/08/21 2239 02/10/21 0847  NA 139 138  K 4.7 4.3  CL 105 105  CO2 25 24  GLUCOSE 156* 166*  BUN 84* 93*  CREATININE 4.59* 5.34*  CALCIUM 9.2 8.7*    Liver Function Tests: Recent Labs  Lab 02/10/21 0847  AST 12*  ALT 14  ALKPHOS 89  BILITOT 0.6  PROT 7.1  ALBUMIN 3.2*   No results for input(s): LIPASE, AMYLASE in the last 168 hours. No results for input(s): AMMONIA in the last 168 hours.  CBC: Recent Labs  Lab 02/08/21 2239 02/10/21 0847  WBC 13.4* 8.6  HGB 11.0* 9.8*  HCT 36.5 32.6*  MCV 93.4 92.1  PLT 234 210    Cardiac Enzymes: No results for input(s): CKTOTAL, CKMB, CKMBINDEX, TROPONINI in the last 168 hours.  BNP: Invalid input(s): POCBNP  CBG: Recent Labs  Lab 02/09/21 1710 02/09/21 2117 02/10/21 0717 02/10/21 1158  GLUCAP 151* 142* 123* 130*    Microbiology: Results for orders placed or performed during the hospital encounter of 02/09/21  Resp Panel by RT-PCR (Flu A&B, Covid) Throat     Status: None   Collection Time: 02/08/21 10:21 PM   Specimen: Throat; Nasopharyngeal(NP) swabs in vial transport medium  Result Value Ref Range Status   SARS Coronavirus 2 by RT PCR NEGATIVE NEGATIVE Final    Comment: (NOTE) SARS-CoV-2 target nucleic acids are NOT DETECTED.  The SARS-CoV-2  RNA is generally detectable in upper respiratory specimens during the acute phase of infection. The lowest concentration of SARS-CoV-2 viral copies this assay can detect is 138 copies/mL. A negative result does not preclude SARS-Cov-2 infection and should not be used as the sole basis for treatment or other patient management decisions. A negative result may occur with  improper specimen collection/handling, submission of specimen other than nasopharyngeal swab, presence of viral mutation(s) within the areas targeted by this assay, and inadequate  number of viral copies(<138 copies/mL). A negative result must be combined with clinical observations, patient history, and epidemiological information. The expected result is Negative.  Fact Sheet for Patients:  EntrepreneurPulse.com.au  Fact Sheet for Healthcare Providers:  IncredibleEmployment.be  This test is no t yet approved or cleared by the [place] FDA and  has been authorized for detection and/or diagnosis of SARS-CoV-2 by FDA under an Emergency Use Authorization (EUA). This EUA will remain  in effect (meaning this test can be used) for the duration of the COVID-19 declaration under Section 564(b)(1) of the Act, 21 U.S.C.section 360bbb-3(b)(1), unless the authorization is terminated  or revoked sooner.       Influenza A by PCR NEGATIVE NEGATIVE Final   Influenza B by PCR NEGATIVE NEGATIVE Final    Comment: (NOTE) The Xpert Xpress SARS-CoV-2/FLU/RSV plus assay is intended as an aid in the diagnosis of influenza from Nasopharyngeal swab specimens and should not be used as a sole basis for treatment. Nasal washings and aspirates are unacceptable for Xpert Xpress SARS-CoV-2/FLU/RSV testing.  Fact Sheet for Patients: EntrepreneurPulse.com.au  Fact Sheet for Healthcare Providers: IncredibleEmployment.be  This test is not yet approved or cleared by the [place] FDA and has been authorized for detection and/or diagnosis of SARS-CoV-2 by FDA under an Emergency Use Authorization (EUA). This EUA will remain in effect (meaning this test can be used) for the duration of the COVID-19 declaration under Section 564(b)(1) of the Act, 21 U.S.C. section 360bbb-3(b)(1), unless the authorization is terminated or revoked.  Performed at [facility], [place], [place] [address]   Group A Strep by PCR [facility] Only)     Status: None   Collection Time: 02/08/21 10:21 PM   Specimen:  Throat; Sterile Swab  Result Value Ref Range Status   Group A Strep by PCR NOT DETECTED NOT DETECTED Final    Comment: Performed at [facility], [address] [place], [place] [address]    Coagulation Studies: No results for input(s): LABPROT, INR in the last 72 hours.  Urinalysis: Recent Labs    02/09/21 1340  COLORURINE YELLOW*  LABSPEC 1.008  PHURINE 5.0  GLUCOSEU NEGATIVE  HGBUR MODERATE*  BILIRUBINUR NEGATIVE  KETONESUR NEGATIVE  PROTEINUR 30*  NITRITE NEGATIVE  LEUKOCYTESUR NEGATIVE      Imaging: DG Chest 2 View  Result Date: 02/08/2021 CLINICAL DATA:  Shortness of breath. Sneezing and scratchy throat since Friday. History of pneumonia. EXAM: CHEST - 2 VIEW COMPARISON:  03/16/2020 FINDINGS: Mild cardiac enlargement. Shallow inspiration. Developing linear infiltration in both mid and lower lung zones. Consider multifocal pneumonia versus atelectasis. Edema less likely to have this appearance. No pleural effusions. No pneumothorax. Mediastinal contours appear intact. Calcification of the aorta. Degenerative changes in the spine and shoulders. IMPRESSION: Developing infiltrates in both mid and lower lung zones may represent pneumonia versus atelectasis. Electronically Signed   By: Lucienne Capers M.D.   On: 02/08/2021 23:07   US RENAL  Result Date: 02/09/2021 CLINICAL DATA:  A 55 year old female presents for evaluation of renal failure EXAM: RENAL / URINARY TRACT ULTRASOUND COMPLETE COMPARISON:  Renal sonogram from 2019. FINDINGS: Right Kidney: Renal measurements: 11.0 x 6.0 x 5.0 cm = volume: 170 mL. Marked increased echogenicity. No hydronephrosis. Limited assessment due to patient body habitus. Small cyst in the RIGHT kidney measuring 1.5 x 1.2 x 1.2 cm. Left Kidney: Renal measurements: 13.5 x 6.2 x 5.2 cm = volume: 227 mL. Increased cortical echogenicity on the LEFT as well. Dilation of the LEFT renal pelvis and infundibula. Limited assessment due to patient  body habitus. Trace perinephric fluid on the LEFT. Bladder: Appears normal for degree of bladder distention. Other: No ascites. IMPRESSION: Echogenic renal parenchyma, this could be seen in the setting of medical renal disease. Trace perinephric fluid on the LEFT could also be seen in this context. Mild LEFT hydronephrosis with dilation of renal pelvis and infundibular dilation, difficult to fully evaluate due to patient body habitus. Correlate with any LEFT-sided symptoms and with CT imaging as warranted. Electronically Signed   By: Zetta Bills M.D.   On: 02/09/2021 13:27   ECHOCARDIOGRAM COMPLETE  Result Date: 02/10/2021    ECHOCARDIOGRAM REPORT   Patient Name:   JUNIOR KENEDY Date of Exam: 02/09/2021 Medical Rec #:  [id number]      Height:       64.0 in Accession #:    [id number]     Weight:       351.0 lb Date of Birth:  Jan 31, 1966       BSA:          2.485 m Patient Age:    8 years       BP:           136/71 mmHg Patient Gender: F              HR:           84 bpm. Exam Location:  [facility] Procedure: 2D Echo, Cardiac Doppler, Color Doppler and Intracardiac            Opacification Agent Indications:     U72.53 Acute Diastolic CHF  History:         Patient has prior history of Echocardiogram examinations, most                  recent 02/09/2020. CHF, Arrythmias:Atrial Fibrillation; Risk                  Factors:Hypertension, Diabetes, Dyslipidemia and Morbid                  Obesity. Hypothyroidism. Chronic kidney disease. Pulmonary                  embolism.  Sonographer:     Cresenciano Lick RDCS Referring Phys:  [id number] Emeterio Reeve Diagnosing Phys: Nelva Bush MD IMPRESSIONS  1. Left ventricular ejection fraction, by estimation, is 60 to 65%. The left ventricle has normal function. Left ventricular endocardial border not optimally defined to evaluate regional wall motion. There is moderate left ventricular hypertrophy. Left ventricular diastolic function could not be evaluated.  2. Right  ventricular systolic function is normal. The right ventricular size is mildly enlarged.  3. Left atrial size was moderately dilated.  4. Right atrial size was moderately dilated.  5. The mitral valve is normal in structure. Trivial mitral valve regurgitation.  6. The aortic valve has an indeterminant number of cusps. Aortic valve regurgitation is not visualized. No aortic  stenosis is present.  7. The inferior vena cava is normal in size with greater than 50% respiratory variability, suggesting right atrial pressure of 3 mmHg. FINDINGS  Left Ventricle: Left ventricular ejection fraction, by estimation, is 60 to 65%. The left ventricle has normal function. Left ventricular endocardial border not optimally defined to evaluate regional wall motion. Definity contrast agent was given IV to delineate the left ventricular endocardial borders. The left ventricular internal cavity size was normal in size. There is moderate left ventricular hypertrophy. Left ventricular diastolic function could not be evaluated due to atrial fibrillation. Left ventricular diastolic function could not be evaluated. Right Ventricle: The right ventricular size is mildly enlarged. No increase in right ventricular wall thickness. Right ventricular systolic function is normal. Left Atrium: Left atrial size was moderately dilated. Right Atrium: Right atrial size was moderately dilated. Pericardium: Trivial pericardial effusion is present. Mitral Valve: The mitral valve is normal in structure. Trivial mitral valve regurgitation. Tricuspid Valve: The tricuspid valve is normal in structure. Tricuspid valve regurgitation is trivial. Aortic Valve: The aortic valve has an indeterminant number of cusps. Aortic valve regurgitation is not visualized. No aortic stenosis is present. Pulmonic Valve: The pulmonic valve was not well visualized. Pulmonic valve regurgitation is not visualized. No evidence of pulmonic stenosis. Aorta: The aortic root is normal in  size and structure. Pulmonary Artery: The pulmonary artery is not well seen. Venous: The inferior vena cava is normal in size with greater than 50% respiratory variability, suggesting right atrial pressure of 3 mmHg. IAS/Shunts: The interatrial septum was not well visualized.  LEFT VENTRICLE PLAX 2D LVIDd:         4.80 cm LVIDs:         3.00 cm LV PW:         1.50 cm LV IVS:        1.50 cm LVOT diam:     2.20 cm LV SV:         75 LV SV Index:   30 LVOT Area:     3.80 cm  RIGHT VENTRICLE             IVC RV Basal diam:  4.40 cm     IVC diam: 1.50 cm RV S prime:     12.80 cm/s TAPSE (M-mode): 2.4 cm LEFT ATRIUM           Index        RIGHT ATRIUM           Index LA diam:      5.20 cm 2.09 cm/m   RA Area:     22.50 cm LA Vol (A4C): 75.1 ml 30.23 ml/m  RA Volume:   71.80 ml  28.90 ml/m  AORTIC VALVE LVOT Vmax:   102.95 cm/s LVOT Vmean:  77.850 cm/s LVOT VTI:    0.196 m  AORTA Ao Root diam: 3.00 cm MV E velocity: 123.67 cm/s                             SHUNTS                             Systemic VTI:  0.20 m                             Systemic Diam: 2.20 cm Nelva Bush MD Electronically signed by  Nelva Bush MD Signature Date/Time: 02/10/2021/7:15:21 AM    Final      Medications:    sodium chloride      amLODipine  5 mg Oral BID   vitamin C  500 mg Oral BID   atorvastatin  20 mg Oral QHS   calcitRIOL  0.25 mcg Oral Daily   calcium carbonate  500 mg of elemental calcium Oral Daily   cloNIDine  0.2 mg Oral BID   folic acid  4,037 mcg Oral Daily   furosemide  40 mg Oral QID   heparin injection (subcutaneous)  5,000 Units Subcutaneous Q8H   insulin aspart  0-15 Units Subcutaneous TID WC   insulin aspart  4 Units Subcutaneous TID WC   ipratropium-albuterol  3 mL Inhalation Q6H   levothyroxine  112 mcg Oral QAC breakfast   melatonin  10 mg Oral QHS   metoprolol tartrate  75 mg Oral BID   potassium chloride SA  20 mEq Oral Daily   sodium chloride flush  3 mL Intravenous Q12H   [START ON  02/11/2021] Vitamin D (Ergocalciferol)  50,000 Units Oral Q Fri   sodium chloride, acetaminophen, albuterol, ondansetron (ZOFRAN) IV, sodium chloride flush  Assessment/ Plan:  [name] is a 55 y.o.  female with a past medical history of anemia, hypertension, dyslipidemia, diabetes, CHF, and chronic kidney disease.  Patient presents to the hospital with complaints of shortness of breath and sore throat which started late last week.  Patient has been admitted for Cardiorenal syndrome with renal failure [I13.10]   Acute Kidney Injury on chronic kidney disease stage IV with baseline creatinine 2.43 and GFR of 23 on 01/10/21.  Acute kidney injury believed secondary to cardiorenal syndrome Renal ultrasound shows a mild left hydronephrosis with recommended CT imaging follow-up. No IV contrast exposure No acute indication of dialysis at this time.  Continue diuresis with furosemide 40 mg every 6 hours.  We will recommend oral dosing based on BNP.  We will continue to monitor.  Lab Results  Component Value Date   CREATININE 5.34 (H) 02/10/2021   CREATININE 4.59 (H) 02/08/2021   CREATININE 2.43 (H) 01/10/2021    Intake/Output Summary (Last 24 hours) at 02/10/2021 1348 Last data filed at 02/10/2021 1100 Gross per 24 hour  Intake 200 ml  Output 2050 ml  Net -1850 ml   2. Anemia of chronic kidney disease Lab Results  Component Value Date   HGB 9.8 (L) 02/10/2021    Hemoglobin within acceptable range.  3. Secondary Hyperparathyroidism:  Lab Results  Component Value Date   PTH 94 (H) 11/18/2017   CALCIUM 8.7 (L) 02/10/2021   PHOS 3.8 03/29/2018  Calcium and phosphorus within acceptable range. Continue home dosing of calcium carbonate, calcitriol, vitamin C and cholecalciferol.  4.  Hypertension with chronic kidney disease.  Home regimen includes amlodipine, clonidine, furosemide, and metoprolol.  Currently receiving these medications.  Blood pressure within acceptable range for  this patient    LOS: 1 Kailon Treese 12/29/20221:48 PM

## 2021-02-10 NOTE — ED Notes (Signed)
This RN to bedside to assess pt. Pt. On toilet, O2 off, and ambulated self back to recliner with steady gait, NAD. Pt. Able to hold conversation, full sentences, denies SOB. Pt's O2 sat 89% on RA. Pt. Placed back on O2 [place],  and BP and pulse ox monitoring. Will continue to monitor. Pt. Verbalizes understanding that she will call for help for change of condition or if she needs help.

## 2021-02-11 DIAGNOSIS — I130 Hypertensive heart and chronic kidney disease with heart failure and stage 1 through stage 4 chronic kidney disease, or unspecified chronic kidney disease: Secondary | ICD-10-CM | POA: Diagnosis not present

## 2021-02-11 DIAGNOSIS — I482 Chronic atrial fibrillation, unspecified: Secondary | ICD-10-CM | POA: Diagnosis not present

## 2021-02-11 LAB — GLUCOSE, CAPILLARY
Glucose-Capillary: 122 mg/dL — ABNORMAL HIGH (ref 70–99)
Glucose-Capillary: 134 mg/dL — ABNORMAL HIGH (ref 70–99)
Glucose-Capillary: 116 mg/dL — ABNORMAL HIGH (ref 70–99)
Glucose-Capillary: 146 mg/dL — ABNORMAL HIGH (ref 70–99)

## 2021-02-11 LAB — COMPREHENSIVE METABOLIC PANEL
ALT: 16 U/L (ref 0–44)
AST: 14 U/L — ABNORMAL LOW (ref 15–41)
Albumin: 3.2 g/dL — ABNORMAL LOW (ref 3.5–5.0)
Alkaline Phosphatase: 109 U/L (ref 38–126)
Anion gap: 13 (ref 5–15)
BUN: 96 mg/dL — ABNORMAL HIGH (ref 6–20)
CO2: 28 mmol/L (ref 22–32)
Calcium: 9.3 mg/dL (ref 8.9–10.3)
Chloride: 101 mmol/L (ref 98–111)
Creatinine, Ser: 5.87 mg/dL — ABNORMAL HIGH (ref 0.44–1.00)
GFR, Estimated: 8 mL/min — ABNORMAL LOW (ref >60)
Glucose, Bld: 146 mg/dL — ABNORMAL HIGH (ref 70–99)
Potassium: 4.7 mmol/L (ref 3.5–5.1)
Sodium: 142 mmol/L (ref 135–145)
Total Bilirubin: 0.7 mg/dL (ref 0.3–1.2)
Total Protein: 7.7 g/dL (ref 6.5–8.1)

## 2021-02-11 LAB — CBC
HCT: 34.2 % — ABNORMAL LOW (ref 36.0–46.0)
Hemoglobin: 10.2 g/dL — ABNORMAL LOW (ref 12.0–15.0)
MCH: 28 pg (ref 26.0–34.0)
MCHC: 29.8 g/dL — ABNORMAL LOW (ref 30.0–36.0)
MCV: 94 fL (ref 80.0–100.0)
Platelets: 241 10*3/uL (ref 150–400)
RBC: 3.64 MIL/uL — ABNORMAL LOW (ref 3.87–5.11)
RDW: 15.1 % (ref 11.5–15.5)
WBC: 11.9 10*3/uL — ABNORMAL HIGH (ref 4.0–10.5)
nRBC: 0 % (ref 0.0–0.2)

## 2021-02-11 MED ORDER — AZITHROMYCIN 250 MG PO TABS
500.0000 mg | ORAL_TABLET | Freq: Every day | ORAL | Status: AC
  Administered 2021-02-11 – 2021-02-15 (×5): 500 mg via ORAL
  Filled 2021-02-11 (×5): qty 2

## 2021-02-11 MED ORDER — POLYETHYLENE GLYCOL 3350 17 G PO PACK
17.0000 g | PACK | Freq: Every day | ORAL | Status: DC
  Administered 2021-02-11 – 2021-02-21 (×7): 17 g via ORAL
  Filled 2021-02-11 (×10): qty 1

## 2021-02-11 MED ORDER — GUAIFENESIN-DM 100-10 MG/5ML PO SYRP
5.0000 mL | ORAL_SOLUTION | ORAL | Status: DC | PRN
  Administered 2021-02-11 – 2021-02-14 (×7): 5 mL via ORAL
  Filled 2021-02-11 (×7): qty 5

## 2021-02-11 MED ORDER — SODIUM CHLORIDE 0.9 % IV SOLN
2.0000 g | INTRAVENOUS | Status: AC
  Administered 2021-02-11 – 2021-02-15 (×5): 2 g via INTRAVENOUS
  Filled 2021-02-11 (×5): qty 20

## 2021-02-11 MED ORDER — NYSTATIN 100000 UNIT/GM EX POWD
Freq: Two times a day (BID) | CUTANEOUS | Status: DC
  Administered 2021-02-18: 1 via TOPICAL
  Filled 2021-02-11 (×2): qty 15

## 2021-02-11 NOTE — Progress Notes (Addendum)
[facility]  ROUNDING NOTE   Subjective:   Nicole Schmidt is a 55 year old female with a past medical history of anemia, hypertension, dyslipidemia, diabetes, CHF, and chronic kidney disease.  Patient presents to the hospital with complaints of shortness of breath and sore throat which started late last week.  Patient has been admitted for Viral URI [J06.9] Cardiorenal syndrome with renal failure [I13.10] Cardiorenal syndrome with renal failure, stage 1-4 or unspecified chronic kidney disease, with heart failure ([place]) [I13.0]  Patient is known to our practice and is followed by Dr. Holley Raring.  Patient was last seen in our office in November 2022.    Patient seen sitting at side of bed Alert and oriented States she continues to have a non-productive cough She is unable to lay down due to congestion, makes cough worse Reports shortness of breath with activity, 4L [place]   Objective:  Vital signs in last 24 hours:  Temp:  [97.9 F (36.6 C)-98.6 F (37 C)] 98.2 F (36.8 C) (12/30 1205) Pulse Rate:  [78-102] 86 (12/30 1205) Resp:  [17-24] 19 (12/30 1205) BP: (120-144)/(52-77) 127/59 (12/30 1205) SpO2:  [82 %-97 %] 90 % (12/30 1205) Weight:  [159.2 kg-160 kg] 159.2 kg (12/30 0503)  Weight change:  Filed Weights   02/08/21 2228 02/10/21 1949 02/11/21 0503  Weight: (!) 159.2 kg (!) 160 kg (!) 159.2 kg    Intake/Output: I/O last 3 completed shifts: In: 300 [P.O.:300] Out: 3150 [Urine:3150]   Intake/Output this shift:  Total I/O In: 240 [P.O.:240] Out: 250 [Urine:250]  Physical Exam: General: NAD,sitting at bedside  Head: Normocephalic, atraumatic. Moist oral mucosal membranes  Eyes: Anicteric  Neck: Supple, trachea midline  Lungs:  Mild Rhonchi and wheeze, normal effort, 4L O2, cough  Heart: Regular rate and rhythm  Abdomen:  Soft, nontender  Extremities:  no peripheral edema.  Neurologic: Nonfocal, moving all four extremities  Skin: No lesions       Basic  Metabolic Panel: Recent Labs  Lab 02/08/21 2239 02/10/21 0847 02/11/21 0712  NA 139 138 142  K 4.7 4.3 4.7  CL 105 105 101  CO2 25 24 28   GLUCOSE 156* 166* 146*  BUN 84* 93* 96*  CREATININE 4.59* 5.34* 5.87*  CALCIUM 9.2 8.7* 9.3     Liver Function Tests: Recent Labs  Lab 02/10/21 0847 02/11/21 0712  AST 12* 14*  ALT 14 16  ALKPHOS 89 109  BILITOT 0.6 0.7  PROT 7.1 7.7  ALBUMIN 3.2* 3.2*    No results for input(s): LIPASE, AMYLASE in the last 168 hours. No results for input(s): AMMONIA in the last 168 hours.  CBC: Recent Labs  Lab 02/08/21 2239 02/10/21 0847 02/11/21 0712  WBC 13.4* 8.6 11.9*  HGB 11.0* 9.8* 10.2*  HCT 36.5 32.6* 34.2*  MCV 93.4 92.1 94.0  PLT 234 210 241     Cardiac Enzymes: No results for input(s): CKTOTAL, CKMB, CKMBINDEX, TROPONINI in the last 168 hours.  BNP: Invalid input(s): POCBNP  CBG: Recent Labs  Lab 02/10/21 1621 02/10/21 1832 02/10/21 2154 02/11/21 0818 02/11/21 1206  GLUCAP 117* 134* 142* 122* 134*     Microbiology: Results for orders placed or performed during the hospital encounter of 02/09/21  Resp Panel by RT-PCR (Flu A&B, Covid) Throat     Status: None   Collection Time: 02/08/21 10:21 PM   Specimen: Throat; Nasopharyngeal(NP) swabs in vial transport medium  Result Value Ref Range Status   SARS Coronavirus 2 by RT PCR NEGATIVE  NEGATIVE Final    Comment: (NOTE) SARS-CoV-2 target nucleic acids are NOT DETECTED.  The SARS-CoV-2 RNA is generally detectable in upper respiratory specimens during the acute phase of infection. The lowest concentration of SARS-CoV-2 viral copies this assay can detect is 138 copies/mL. A negative result does not preclude SARS-Cov-2 infection and should not be used as the sole basis for treatment or other patient management decisions. A negative result may occur with  improper specimen collection/handling, submission of specimen other than nasopharyngeal swab, presence of viral  mutation(s) within the areas targeted by this assay, and inadequate number of viral copies(<138 copies/mL). A negative result must be combined with clinical observations, patient history, and epidemiological information. The expected result is Negative.  Fact Sheet for Patients:  EntrepreneurPulse.com.au  Fact Sheet for Healthcare Providers:  IncredibleEmployment.be  This test is no t yet approved or cleared by the [place] FDA and  has been authorized for detection and/or diagnosis of SARS-CoV-2 by FDA under an Emergency Use Authorization (EUA). This EUA will remain  in effect (meaning this test can be used) for the duration of the COVID-19 declaration under Section 564(b)(1) of the Act, 21 U.S.C.section 360bbb-3(b)(1), unless the authorization is terminated  or revoked sooner.       Influenza A by PCR NEGATIVE NEGATIVE Final   Influenza B by PCR NEGATIVE NEGATIVE Final    Comment: (NOTE) The Xpert Xpress SARS-CoV-2/FLU/RSV plus assay is intended as an aid in the diagnosis of influenza from Nasopharyngeal swab specimens and should not be used as a sole basis for treatment. Nasal washings and aspirates are unacceptable for Xpert Xpress SARS-CoV-2/FLU/RSV testing.  Fact Sheet for Patients: EntrepreneurPulse.com.au  Fact Sheet for Healthcare Providers: IncredibleEmployment.be  This test is not yet approved or cleared by the [place] FDA and has been authorized for detection and/or diagnosis of SARS-CoV-2 by FDA under an Emergency Use Authorization (EUA). This EUA will remain in effect (meaning this test can be used) for the duration of the COVID-19 declaration under Section 564(b)(1) of the Act, 21 U.S.C. section 360bbb-3(b)(1), unless the authorization is terminated or revoked.  Performed at [facility], [place], [place] [address]   Group A Strep by PCR [facility] Only)      Status: None   Collection Time: 02/08/21 10:21 PM   Specimen: Throat; Sterile Swab  Result Value Ref Range Status   Group A Strep by PCR NOT DETECTED NOT DETECTED Final    Comment: Performed at [facility], [address] [place], [place] [address]    Coagulation Studies: No results for input(s): LABPROT, INR in the last 72 hours.  Urinalysis: Recent Labs    02/09/21 1340  COLORURINE YELLOW*  LABSPEC 1.008  PHURINE 5.0  GLUCOSEU NEGATIVE  HGBUR MODERATE*  BILIRUBINUR NEGATIVE  KETONESUR NEGATIVE  PROTEINUR 30*  NITRITE NEGATIVE  LEUKOCYTESUR NEGATIVE       Imaging: ECHOCARDIOGRAM COMPLETE  Result Date: 02/10/2021    ECHOCARDIOGRAM REPORT   Patient Name:   Nicole Schmidt Date of Exam: 02/09/2021 Medical Rec #:  [id number]      Height:       64.0 in Accession #:    [id number]     Weight:       351.0 lb Date of Birth:  12/06/1965       BSA:          2.485 m Patient Age:    89 years       BP:  136/71 mmHg Patient Gender: F              HR:           84 bpm. Exam Location:  [facility] Procedure: 2D Echo, Cardiac Doppler, Color Doppler and Intracardiac            Opacification Agent Indications:     T01.77 Acute Diastolic CHF  History:         Patient has prior history of Echocardiogram examinations, most                  recent 02/09/2020. CHF, Arrythmias:Atrial Fibrillation; Risk                  Factors:Hypertension, Diabetes, Dyslipidemia and Morbid                  Obesity. Hypothyroidism. Chronic kidney disease. Pulmonary                  embolism.  Sonographer:     Cresenciano Lick RDCS Referring Phys:  [id number] Emeterio Reeve Diagnosing Phys: Nelva Bush MD IMPRESSIONS  1. Left ventricular ejection fraction, by estimation, is 60 to 65%. The left ventricle has normal function. Left ventricular endocardial border not optimally defined to evaluate regional wall motion. There is moderate left ventricular hypertrophy. Left ventricular diastolic function  could not be evaluated.  2. Right ventricular systolic function is normal. The right ventricular size is mildly enlarged.  3. Left atrial size was moderately dilated.  4. Right atrial size was moderately dilated.  5. The mitral valve is normal in structure. Trivial mitral valve regurgitation.  6. The aortic valve has an indeterminant number of cusps. Aortic valve regurgitation is not visualized. No aortic stenosis is present.  7. The inferior vena cava is normal in size with greater than 50% respiratory variability, suggesting right atrial pressure of 3 mmHg. FINDINGS  Left Ventricle: Left ventricular ejection fraction, by estimation, is 60 to 65%. The left ventricle has normal function. Left ventricular endocardial border not optimally defined to evaluate regional wall motion. Definity contrast agent was given IV to delineate the left ventricular endocardial borders. The left ventricular internal cavity size was normal in size. There is moderate left ventricular hypertrophy. Left ventricular diastolic function could not be evaluated due to atrial fibrillation. Left ventricular diastolic function could not be evaluated. Right Ventricle: The right ventricular size is mildly enlarged. No increase in right ventricular wall thickness. Right ventricular systolic function is normal. Left Atrium: Left atrial size was moderately dilated. Right Atrium: Right atrial size was moderately dilated. Pericardium: Trivial pericardial effusion is present. Mitral Valve: The mitral valve is normal in structure. Trivial mitral valve regurgitation. Tricuspid Valve: The tricuspid valve is normal in structure. Tricuspid valve regurgitation is trivial. Aortic Valve: The aortic valve has an indeterminant number of cusps. Aortic valve regurgitation is not visualized. No aortic stenosis is present. Pulmonic Valve: The pulmonic valve was not well visualized. Pulmonic valve regurgitation is not visualized. No evidence of pulmonic stenosis.  Aorta: The aortic root is normal in size and structure. Pulmonary Artery: The pulmonary artery is not well seen. Venous: The inferior vena cava is normal in size with greater than 50% respiratory variability, suggesting right atrial pressure of 3 mmHg. IAS/Shunts: The interatrial septum was not well visualized.  LEFT VENTRICLE PLAX 2D LVIDd:         4.80 cm LVIDs:         3.00 cm LV PW:  1.50 cm LV IVS:        1.50 cm LVOT diam:     2.20 cm LV SV:         75 LV SV Index:   30 LVOT Area:     3.80 cm  RIGHT VENTRICLE             IVC RV Basal diam:  4.40 cm     IVC diam: 1.50 cm RV S prime:     12.80 cm/s TAPSE (M-mode): 2.4 cm LEFT ATRIUM           Index        RIGHT ATRIUM           Index LA diam:      5.20 cm 2.09 cm/m   RA Area:     22.50 cm LA Vol (A4C): 75.1 ml 30.23 ml/m  RA Volume:   71.80 ml  28.90 ml/m  AORTIC VALVE LVOT Vmax:   102.95 cm/s LVOT Vmean:  77.850 cm/s LVOT VTI:    0.196 m  AORTA Ao Root diam: 3.00 cm MV E velocity: 123.67 cm/s                             SHUNTS                             Systemic VTI:  0.20 m                             Systemic Diam: 2.20 cm Nelva Bush MD Electronically signed by Nelva Bush MD Signature Date/Time: 02/10/2021/7:15:21 AM    Final      Medications:    sodium chloride      amLODipine  5 mg Oral BID   vitamin C  500 mg Oral BID   atorvastatin  20 mg Oral QHS   calcitRIOL  0.25 mcg Oral Daily   calcium carbonate  500 mg of elemental calcium Oral Daily   cloNIDine  0.2 mg Oral BID   folic acid  2,878 mcg Oral Daily   heparin injection (subcutaneous)  5,000 Units Subcutaneous Q8H   insulin aspart  0-15 Units Subcutaneous TID WC   insulin aspart  4 Units Subcutaneous TID WC   levothyroxine  112 mcg Oral QAC breakfast   melatonin  10 mg Oral QHS   metoprolol tartrate  75 mg Oral BID   nystatin   Topical BID   polyethylene glycol  17 g Oral Daily   potassium chloride SA  20 mEq Oral Daily   sodium chloride flush  3 mL  Intravenous Q12H   Vitamin D (Ergocalciferol)  50,000 Units Oral Q Fri   sodium chloride, acetaminophen, albuterol, guaiFENesin-dextromethorphan, ondansetron (ZOFRAN) IV, sodium chloride flush  Assessment/ Plan:  Nicole Schmidt is a 55 y.o.  female with a past medical history of anemia, hypertension, dyslipidemia, diabetes, CHF, and chronic kidney disease.  Patient presents to the hospital with complaints of shortness of breath and sore throat which started late last week.  Patient has been admitted for Viral URI [J06.9] Cardiorenal syndrome with renal failure [I13.10] Cardiorenal syndrome with renal failure, stage 1-4 or unspecified chronic kidney disease, with heart failure ([facility]) [I13.0]   Acute Kidney Injury on chronic kidney disease stage IV with baseline creatinine 2.43 and GFR of 23 on 01/10/21.  Acute kidney injury believed secondary to cardiorenal syndrome Renal ultrasound shows a mild left hydronephrosis with recommended CT imaging follow-up. No IV contrast exposure No acute indication of dialysis at this time.  Creatinine continues to rise to 5.8. Edema improved, may be over diuresed. Due to increased Creatinine and BUN, will hold Lasix. Will monitor renal recovery.   Lab Results  Component Value Date   CREATININE 5.87 (H) 02/11/2021   CREATININE 5.34 (H) 02/10/2021   CREATININE 4.59 (H) 02/08/2021    Intake/Output Summary (Last 24 hours) at 02/11/2021 1408 Last data filed at 02/11/2021 1100 Gross per 24 hour  Intake 340 ml  Output 1050 ml  Net -710 ml    2. Anemia of chronic kidney disease Lab Results  Component Value Date   HGB 10.2 (L) 02/11/2021    Hemoglobin at goal  3. Secondary Hyperparathyroidism:  Lab Results  Component Value Date   PTH 94 (H) 11/18/2017   CALCIUM 9.3 02/11/2021   PHOS 3.8 03/29/2018  Calcium and phosphorus within acceptable range. Continue home dosing of calcium carbonate, calcitriol, vitamin C and cholecalciferol.  4.   Hypertension with chronic kidney disease.  Home regimen includes amlodipine, clonidine, furosemide, and metoprolol.  Currently receiving these medications.  BP 127/59   LOS: 2 Earlyn [place] 12/30/20222:08 PM

## 2021-02-11 NOTE — Progress Notes (Signed)
PROGRESS NOTE    Nicole Schmidt  BZJ:[id number] DOB: 1965-11-10 DOA: 02/09/2021 PCP: Lenard Simmer, MD    Brief Narrative:  55 year old female presented to emergency department with complaint of sore throat/shortness of breath, increasing over past several days prior to presentation.  Was concerned about possible bronchitis.  Patient reports she usually sleeps in a recliner, has noticed breathing a bit worse but has not tried to lay flat.  Patient reports she has consumed a bit more salt recently and possibly more fluid than usual since she has been feeling ill, but she reports adherence to home medication regimen. Is non-smoker/never-smoker, previous occupational respiratory exposure and at some point was told she may have COPD but no formal diagnosis of this, she does see pulmonary for chronic respiratory failure requiring 2L O2 via [place] at home, and OSA.Marland Kitchen At presentation, pt was noted to Cr over 4.5 with concerns of cardiorenal syndrome. Pt was admitted for further workup  Assessment & Plan:   Principal Problem:   Cardiorenal syndrome, stage 1-4 or unspecified chronic kidney disease, with heart failure (HCC) Active Problems:   History of pulmonary embolus (PE)   Acute renal failure superimposed on stage 3 chronic kidney disease (HCC)   Iron deficiency anemia   Goals of care, counseling/discussion   OSA on CPAP   Atrial fibrillation, chronic (HCC)   Dyslipidemia   Diabetes mellitus type 2 in obese [facility])   Acquired hypothyroidism   Physical deconditioning   Chronic respiratory failure with hypoxia (HCC)   Bronchitis  Stage 4 chronic kidney disease, with heart failure (HCC) Recently given gentle diuresis, fluid restriction, strict I&O echocardiogram with EF 60-65% renal ultrasound performed  Held ACE/ARB, avoid other nephrotoxic medications/contrast if possible At time of presentation, held home Eliquis, transition to heparin while inpatient/pending renal improvement Cr is  trending up to over 5. Per Nephrology, recs to cont to hold lasix and allow pt to hydrate   Chronic respiratory failure with hypoxia ([place]) no known COPD, consider outpatient follow-up with PFT if not done recently.  Continued on PRN bronchodilators as needed   Acquired hypothyroidism TSH 0.267 Will check free T4 in AM   Diabetes mellitus type 2 in obese ([place]) Holding home GLP-1, placed on sliding scale insulin Glycemic trends stable A1c 7.2   Dyslipidemia Continue statin   Atrial fibrillation, chronic (HCC) Stable, continue beta-blocker  Held Eliquis at presentation secondary to renal function   History of pulmonary embolus (PE) Low suspicion for recurrence, patient reports compliance with home anticoagulation Appeared stable at present   Iron deficiency anemia Monitor CBC   OSA on CPAP CPAP ordered nightly  PNA -Presenting CXR reviewed. Findings of lower infiltrates -WBC is up to 11.9k this AM -Will start empiric azithro and rocephin for PNA -Wean o2 as tolerated   DVT prophylaxis: heparin Code Status: Full Family Communication: Pt in room, family not at bedside  Status is: Inpatient  Remains inpatient appropriate because: Severity of illness     Consultants:  nephrology  Procedures:    Antimicrobials: Anti-infectives (From admission, onward)    None       Subjective: Without complaints. Denies sob  Objective: Vitals:   02/11/21 0735 02/11/21 0826 02/11/21 1205 02/11/21 1547  BP:  (!) 126/52 (!) 127/59 120/89  Pulse:  81 86 85  Resp:  17 19 16   Temp:  98.6 F (37 C) 98.2 F (36.8 C) 97.9 F (36.6 C)  TempSrc:      SpO2: 92% 91% 90% 92%  Weight:      Height:        Intake/Output Summary (Last 24 hours) at 02/11/2021 1659 Last data filed at 02/11/2021 1100 Gross per 24 hour  Intake 340 ml  Output 1050 ml  Net -710 ml    Filed Weights   02/08/21 2228 02/10/21 1949 02/11/21 0503  Weight: (!) 159.2 kg (!) 160 kg (!) 159.2 kg     Examination: General exam: Conversant, in no acute distress Respiratory system: normal chest rise, clear, no audible wheezing Cardiovascular system: regular rhythm, s1-s2 Gastrointestinal system: Nondistended, nontender, pos BS Central nervous system: No seizures, no tremors Extremities: No cyanosis, no joint deformities Skin: No rashes, no pallor Psychiatry: Affect normal // no auditory hallucinations   Data Reviewed: I have personally reviewed following labs and imaging studies  CBC: Recent Labs  Lab 02/08/21 2239 02/10/21 0847 02/11/21 0712  WBC 13.4* 8.6 11.9*  HGB 11.0* 9.8* 10.2*  HCT 36.5 32.6* 34.2*  MCV 93.4 92.1 94.0  PLT 234 210 474    Basic Metabolic Panel: Recent Labs  Lab 02/08/21 2239 02/10/21 0847 02/11/21 0712  NA 139 138 142  K 4.7 4.3 4.7  CL 105 105 101  CO2 25 24 28   GLUCOSE 156* 166* 146*  BUN 84* 93* 96*  CREATININE 4.59* 5.34* 5.87*  CALCIUM 9.2 8.7* 9.3    GFR: Estimated Creatinine Clearance: 16.1 mL/min (A) (by C-G formula based on SCr of 5.87 mg/dL (H)). Liver Function Tests: Recent Labs  Lab 02/10/21 0847 02/11/21 0712  AST 12* 14*  ALT 14 16  ALKPHOS 89 109  BILITOT 0.6 0.7  PROT 7.1 7.7  ALBUMIN 3.2* 3.2*    No results for input(s): LIPASE, AMYLASE in the last 168 hours. No results for input(s): AMMONIA in the last 168 hours. Coagulation Profile: No results for input(s): INR, PROTIME in the last 168 hours. Cardiac Enzymes: No results for input(s): CKTOTAL, CKMB, CKMBINDEX, TROPONINI in the last 168 hours. BNP (last 3 results) No results for input(s): PROBNP in the last 8760 hours. HbA1C: Recent Labs    02/09/21 1543  HGBA1C 7.2*    CBG: Recent Labs  Lab 02/10/21 1621 02/10/21 1832 02/10/21 2154 02/11/21 0818 02/11/21 1206  GLUCAP 117* 134* 142* 122* 134*    Lipid Profile: No results for input(s): CHOL, HDL, LDLCALC, TRIG, CHOLHDL, LDLDIRECT in the last 72 hours. Thyroid Function Tests: Recent Labs     02/09/21 1543  TSH 0.267*    Anemia Panel: No results for input(s): VITAMINB12, FOLATE, FERRITIN, TIBC, IRON, RETICCTPCT in the last 72 hours. Sepsis Labs: Recent Labs  Lab 02/09/21 2239  PROCALCITON <0.10     Recent Results (from the past 240 hour(s))  Resp Panel by RT-PCR (Flu A&B, Covid) Throat     Status: None   Collection Time: 02/08/21 10:21 PM   Specimen: Throat; Nasopharyngeal(NP) swabs in vial transport medium  Result Value Ref Range Status   SARS Coronavirus 2 by RT PCR NEGATIVE NEGATIVE Final    Comment: (NOTE) SARS-CoV-2 target nucleic acids are NOT DETECTED.  The SARS-CoV-2 RNA is generally detectable in upper respiratory specimens during the acute phase of infection. The lowest concentration of SARS-CoV-2 viral copies this assay can detect is 138 copies/mL. A negative result does not preclude SARS-Cov-2 infection and should not be used as the sole basis for treatment or other patient management decisions. A negative result may occur with  improper specimen collection/handling, submission of specimen other than nasopharyngeal swab, presence of viral  mutation(s) within the areas targeted by this assay, and inadequate number of viral copies(<138 copies/mL). A negative result must be combined with clinical observations, patient history, and epidemiological information. The expected result is Negative.  Fact Sheet for Patients:  EntrepreneurPulse.com.au  Fact Sheet for Healthcare Providers:  IncredibleEmployment.be  This test is no t yet approved or cleared by the [place] FDA and  has been authorized for detection and/or diagnosis of SARS-CoV-2 by FDA under an Emergency Use Authorization (EUA). This EUA will remain  in effect (meaning this test can be used) for the duration of the COVID-19 declaration under Section 564(b)(1) of the Act, 21 U.S.C.section 360bbb-3(b)(1), unless the authorization is terminated  or  revoked sooner.       Influenza A by PCR NEGATIVE NEGATIVE Final   Influenza B by PCR NEGATIVE NEGATIVE Final    Comment: (NOTE) The Xpert Xpress SARS-CoV-2/FLU/RSV plus assay is intended as an aid in the diagnosis of influenza from Nasopharyngeal swab specimens and should not be used as a sole basis for treatment. Nasal washings and aspirates are unacceptable for Xpert Xpress SARS-CoV-2/FLU/RSV testing.  Fact Sheet for Patients: EntrepreneurPulse.com.au  Fact Sheet for Healthcare Providers: IncredibleEmployment.be  This test is not yet approved or cleared by the [place] FDA and has been authorized for detection and/or diagnosis of SARS-CoV-2 by FDA under an Emergency Use Authorization (EUA). This EUA will remain in effect (meaning this test can be used) for the duration of the COVID-19 declaration under Section 564(b)(1) of the Act, 21 U.S.C. section 360bbb-3(b)(1), unless the authorization is terminated or revoked.  Performed at [facility], [place], [place] [address]   Group A Strep by PCR [facility])     Status: None   Collection Time: 02/08/21 10:21 PM   Specimen: Throat; Sterile Swab  Result Value Ref Range Status   Group A Strep by PCR NOT DETECTED NOT DETECTED Final    Comment: Performed at [facility], [address], [place], [place] [address]      Radiology Studies: ECHOCARDIOGRAM COMPLETE  Result Date: 02/10/2021    ECHOCARDIOGRAM REPORT   Patient Name:   Nicole Schmidt Date of Exam: 02/09/2021 Medical Rec #:  [id number]      Height:       64.0 in Accession #:    [id number]     Weight:       351.0 lb Date of Birth:  08-13-1965       BSA:          2.485 m Patient Age:    89 years       BP:           136/71 mmHg Patient Gender: F              HR:           84 bpm. Exam Location:  [facility] Procedure: 2D Echo, Cardiac Doppler, Color Doppler and Intracardiac            Opacification Agent  Indications:     S93.73 Acute Diastolic CHF  History:         Patient has prior history of Echocardiogram examinations, most                  recent 02/09/2020. CHF, Arrythmias:Atrial Fibrillation; Risk                  Factors:Hypertension, Diabetes, Dyslipidemia and Morbid  Obesity. Hypothyroidism. Chronic kidney disease. Pulmonary                  embolism.  Sonographer:     Cresenciano Lick RDCS Referring Phys:  [id number] Emeterio Reeve Diagnosing Phys: Nelva Bush MD IMPRESSIONS  1. Left ventricular ejection fraction, by estimation, is 60 to 65%. The left ventricle has normal function. Left ventricular endocardial border not optimally defined to evaluate regional wall motion. There is moderate left ventricular hypertrophy. Left ventricular diastolic function could not be evaluated.  2. Right ventricular systolic function is normal. The right ventricular size is mildly enlarged.  3. Left atrial size was moderately dilated.  4. Right atrial size was moderately dilated.  5. The mitral valve is normal in structure. Trivial mitral valve regurgitation.  6. The aortic valve has an indeterminant number of cusps. Aortic valve regurgitation is not visualized. No aortic stenosis is present.  7. The inferior vena cava is normal in size with greater than 50% respiratory variability, suggesting right atrial pressure of 3 mmHg. FINDINGS  Left Ventricle: Left ventricular ejection fraction, by estimation, is 60 to 65%. The left ventricle has normal function. Left ventricular endocardial border not optimally defined to evaluate regional wall motion. Definity contrast agent was given IV to delineate the left ventricular endocardial borders. The left ventricular internal cavity size was normal in size. There is moderate left ventricular hypertrophy. Left ventricular diastolic function could not be evaluated due to atrial fibrillation. Left ventricular diastolic function could not be evaluated. Right  Ventricle: The right ventricular size is mildly enlarged. No increase in right ventricular wall thickness. Right ventricular systolic function is normal. Left Atrium: Left atrial size was moderately dilated. Right Atrium: Right atrial size was moderately dilated. Pericardium: Trivial pericardial effusion is present. Mitral Valve: The mitral valve is normal in structure. Trivial mitral valve regurgitation. Tricuspid Valve: The tricuspid valve is normal in structure. Tricuspid valve regurgitation is trivial. Aortic Valve: The aortic valve has an indeterminant number of cusps. Aortic valve regurgitation is not visualized. No aortic stenosis is present. Pulmonic Valve: The pulmonic valve was not well visualized. Pulmonic valve regurgitation is not visualized. No evidence of pulmonic stenosis. Aorta: The aortic root is normal in size and structure. Pulmonary Artery: The pulmonary artery is not well seen. Venous: The inferior vena cava is normal in size with greater than 50% respiratory variability, suggesting right atrial pressure of 3 mmHg. IAS/Shunts: The interatrial septum was not well visualized.  LEFT VENTRICLE PLAX 2D LVIDd:         4.80 cm LVIDs:         3.00 cm LV PW:         1.50 cm LV IVS:        1.50 cm LVOT diam:     2.20 cm LV SV:         75 LV SV Index:   30 LVOT Area:     3.80 cm  RIGHT VENTRICLE             IVC RV Basal diam:  4.40 cm     IVC diam: 1.50 cm RV S prime:     12.80 cm/s TAPSE (M-mode): 2.4 cm LEFT ATRIUM           Index        RIGHT ATRIUM           Index LA diam:      5.20 cm 2.09 cm/m   RA Area:  22.50 cm LA Vol (A4C): 75.1 ml 30.23 ml/m  RA Volume:   71.80 ml  28.90 ml/m  AORTIC VALVE LVOT Vmax:   102.95 cm/s LVOT Vmean:  77.850 cm/s LVOT VTI:    0.196 m  AORTA Ao Root diam: 3.00 cm MV E velocity: 123.67 cm/s                             SHUNTS                             Systemic VTI:  0.20 m                             Systemic Diam: 2.20 cm Nelva Bush MD Electronically  signed by Nelva Bush MD Signature Date/Time: 02/10/2021/7:15:21 AM    Final     Scheduled Meds:  amLODipine  5 mg Oral BID   vitamin C  500 mg Oral BID   atorvastatin  20 mg Oral QHS   calcitRIOL  0.25 mcg Oral Daily   calcium carbonate  500 mg of elemental calcium Oral Daily   cloNIDine  0.2 mg Oral BID   folic acid  7,371 mcg Oral Daily   heparin injection (subcutaneous)  5,000 Units Subcutaneous Q8H   insulin aspart  0-15 Units Subcutaneous TID WC   insulin aspart  4 Units Subcutaneous TID WC   levothyroxine  112 mcg Oral QAC breakfast   melatonin  10 mg Oral QHS   metoprolol tartrate  75 mg Oral BID   nystatin   Topical BID   polyethylene glycol  17 g Oral Daily   potassium chloride SA  20 mEq Oral Daily   sodium chloride flush  3 mL Intravenous Q12H   Vitamin D (Ergocalciferol)  50,000 Units Oral Q Fri   Continuous Infusions:  sodium chloride       LOS: 2 days   Marylu Lund, MD [facility] Pager On Amion  If 7PM-7AM, please contact night-coverage 02/11/2021, 4:59 PM

## 2021-02-11 NOTE — Consult Note (Addendum)
° °  Heart Failure Nurse Navigator Note  HFpEF 60 to 65%.  Left ventricular hypertrophy.  Normal right systolic function.  Moderate biatrial enlargement.  She presented to the emergency room with complains of scratchy throat shortness of breath and a dry cough which was worse when she was lying flat.  Comorbidities:  Chronic kidney disease Chronic respiratory failure Hypothyroidism Diabetes Hyperlipidemia Chronic atrial fibrillation Iron deficiency anemia Obstructive sleep apnea on CPAP  Labs:  Sodium 142, potassium 4.7, chloride 101, BUN 96, creatinine 5.87 was 4.59 admission.  Cumin is 3.2, AST 14, ALT 16 Weight is 159.2 kg Blood pressure 127/50  Medications:  Amlodipine 5 mg 2 times a day Atorvastatin 20 mg daily Clonidine 0.2 mg 2 times a day Metoprolol tartrate 75 mg 2 times a day Potassium chloride 20 mEq daily  Furosemide is currently on hold.  Initial meeting with patient on this admission.  She states that she has improved some but still unable to lie flat as she will begin to cough.  She states at home she had been weighing herself daily and had not met any of the parameters to call, she states 1 day she might gain 2 pounds and then no weight gain on the next day and then may be a pound on the next day following that.  She states that Dr. Zollie Scale has her on a fluid restriction of 40 ounces daily.  The bottles that she drinks are 16.9 total ounces and tomorrow will drink to 3 which equals 51 ounces.  Also discussed freezing grapes or strawberries to use quenching her thirst.  Along with other remedies.  Felt that over the holidays she had done well with sticking with her diet but did at one setting eat some ham.  Also talked about notifying the heart clinic when she feels she is getting into trouble he voices understanding.  Discussed following up in the outpatient heart failure clinic for which he has an appointment on January 12 at 1230.  The 1% no-show ratio which  is 1 out of 172 appointments.  Pricilla Riffle RN [facility]

## 2021-02-12 ENCOUNTER — Inpatient Hospital Stay: Payer: BC Managed Care – PPO

## 2021-02-12 DIAGNOSIS — I482 Chronic atrial fibrillation, unspecified: Secondary | ICD-10-CM | POA: Diagnosis not present

## 2021-02-12 DIAGNOSIS — I130 Hypertensive heart and chronic kidney disease with heart failure and stage 1 through stage 4 chronic kidney disease, or unspecified chronic kidney disease: Secondary | ICD-10-CM | POA: Diagnosis not present

## 2021-02-12 LAB — CBC
HCT: 33.9 % — ABNORMAL LOW (ref 36.0–46.0)
Hemoglobin: 10 g/dL — ABNORMAL LOW (ref 12.0–15.0)
MCH: 27.4 pg (ref 26.0–34.0)
MCHC: 29.5 g/dL — ABNORMAL LOW (ref 30.0–36.0)
MCV: 92.9 fL (ref 80.0–100.0)
Platelets: 229 10*3/uL (ref 150–400)
RBC: 3.65 MIL/uL — ABNORMAL LOW (ref 3.87–5.11)
RDW: 15.1 % (ref 11.5–15.5)
WBC: 15.7 10*3/uL — ABNORMAL HIGH (ref 4.0–10.5)
nRBC: 0 % (ref 0.0–0.2)

## 2021-02-12 LAB — COMPREHENSIVE METABOLIC PANEL
ALT: 16 U/L (ref 0–44)
AST: 13 U/L — ABNORMAL LOW (ref 15–41)
Albumin: 3.4 g/dL — ABNORMAL LOW (ref 3.5–5.0)
Alkaline Phosphatase: 97 U/L (ref 38–126)
Anion gap: 14 (ref 5–15)
BUN: 108 mg/dL — ABNORMAL HIGH (ref 6–20)
CO2: 23 mmol/L (ref 22–32)
Calcium: 9.8 mg/dL (ref 8.9–10.3)
Chloride: 103 mmol/L (ref 98–111)
Creatinine, Ser: 5.93 mg/dL — ABNORMAL HIGH (ref 0.44–1.00)
GFR, Estimated: 8 mL/min — ABNORMAL LOW (ref >60)
Glucose, Bld: 145 mg/dL — ABNORMAL HIGH (ref 70–99)
Potassium: 4.8 mmol/L (ref 3.5–5.1)
Sodium: 140 mmol/L (ref 135–145)
Total Bilirubin: 0.6 mg/dL (ref 0.3–1.2)
Total Protein: 7.6 g/dL (ref 6.5–8.1)

## 2021-02-12 LAB — GLUCOSE, CAPILLARY
Glucose-Capillary: 135 mg/dL — ABNORMAL HIGH (ref 70–99)
Glucose-Capillary: 170 mg/dL — ABNORMAL HIGH (ref 70–99)
Glucose-Capillary: 133 mg/dL — ABNORMAL HIGH (ref 70–99)
Glucose-Capillary: 122 mg/dL — ABNORMAL HIGH (ref 70–99)

## 2021-02-12 MED ORDER — IOHEXOL 9 MG/ML PO SOLN
500.0000 mL | ORAL | Status: AC
  Administered 2021-02-12: 500 mL via ORAL

## 2021-02-12 MED ORDER — IOHEXOL 9 MG/ML PO SOLN
500.0000 mL | ORAL | Status: DC
  Administered 2021-02-12: 500 mL via ORAL

## 2021-02-12 NOTE — Progress Notes (Signed)
PROGRESS NOTE    Nicole Schmidt  BZJ:[id number] DOB: [birth year] DOA: 02/09/2021 PCP: Lenard Simmer, MD    Brief Narrative:  55 year old female presented to emergency department with complaint of sore throat/shortness of breath, increasing over past several days prior to presentation.  Was concerned about possible bronchitis.  Patient reports she usually sleeps in a recliner, has noticed breathing a bit worse but has not tried to lay flat.  Patient reports she has consumed a bit more salt recently and possibly more fluid than usual since she has been feeling ill, but she reports adherence to home medication regimen. Is non-smoker/never-smoker, previous occupational respiratory exposure and at some point was told she may have COPD but no formal diagnosis of this, she does see pulmonary for chronic respiratory failure requiring 2L O2 via [place] at home, and OSA.Marland Kitchen At presentation, pt was noted to Cr over 4.5 with concerns of cardiorenal syndrome. Pt was admitted for further workup  Assessment & Plan:   Principal Problem:   Cardiorenal syndrome, stage 1-4 or unspecified chronic kidney disease, with heart failure (HCC) Active Problems:   History of pulmonary embolus (PE)   Acute renal failure superimposed on stage 3 chronic kidney disease (HCC)   Iron deficiency anemia   Goals of care, counseling/discussion   OSA on CPAP   Atrial fibrillation, chronic (HCC)   Dyslipidemia   Diabetes mellitus type 2 in obese [facility])   Acquired hypothyroidism   Physical deconditioning   Chronic respiratory failure with hypoxia (HCC)   Bronchitis  Stage 4 chronic kidney disease, with heart failure (HCC) Recently given gentle diuresis, fluid restriction, strict I&O echocardiogram with EF 60-65% renal ultrasound performed  Held ACE/ARB, avoid other nephrotoxic medications/contrast if possible At time of presentation, held home Eliquis, transition to heparin while inpatient/pending renal improvement Cr continues  to trend up Appreciate input by Nephrology, recs for abd CT today. No need for RRT today Repeat bmet in AM   Chronic respiratory failure with hypoxia ([place]) no known COPD, consider outpatient follow-up with PFT if not done recently.  Continued on PRN bronchodilators as needed   Acquired hypothyroidism TSH 0.267 Will check free T4 in AM   Diabetes mellitus type 2 in obese ([place]) Holding home GLP-1, placed on sliding scale insulin Glycemic trends stable A1c 7.2   Dyslipidemia Continue statin   Atrial fibrillation, chronic (HCC) Stable, continue beta-blocker  Held Eliquis at presentation secondary to renal function   History of pulmonary embolus (PE) Low suspicion for recurrence, patient reports compliance with home anticoagulation Appeared stable at present   Iron deficiency anemia Monitor CBC   OSA on CPAP CPAP ordered nightly  PNA -Presenting CXR reviewed. Findings of lower infiltrates -WBC is up to 11.9k this AM -Will start empiric azithro and rocephin for PNA -Wean o2 as tolerated   DVT prophylaxis: heparin Code Status: Full Family Communication: Pt in room, family not at bedside  Status is: Inpatient  Remains inpatient appropriate because: Severity of illness    Consultants:  nephrology  Procedures:    Antimicrobials: Anti-infectives (From admission, onward)    Start     Dose/Rate Route Frequency Ordered Stop   02/11/21 1800  cefTRIAXone (ROCEPHIN) 2 g in sodium chloride 0.9 % 100 mL IVPB        2 g 200 mL/hr over 30 Minutes Intravenous Every 24 hours 02/11/21 1710 02/16/21 1759   02/11/21 1800  azithromycin (ZITHROMAX) tablet 500 mg        500 mg Oral Daily  02/11/21 1710 02/16/21 0959       Subjective: Without sob or abd pain  Objective: Vitals:   02/12/21 0511 02/12/21 0759 02/12/21 1126 02/12/21 1628  BP: (!) 142/77 (!) 151/74 (!) 162/73 (!) 141/63  Pulse: 87 96 84 87  Resp: 18 19 18 18   Temp: 98.3 F (36.8 C) 98.2 F (36.8 C) 98.2 F  (36.8 C) 98.2 F (36.8 C)  TempSrc: Oral     SpO2: 94% 93% 92% 95%  Weight:      Height:        Intake/Output Summary (Last 24 hours) at 02/12/2021 1725 Last data filed at 02/12/2021 1500 Gross per 24 hour  Intake 1580.06 ml  Output 700 ml  Net 880.06 ml    Filed Weights   02/10/21 1949 02/11/21 0503 02/12/21 0139  Weight: (!) 160 kg (!) 159.2 kg (!) 160.1 kg    Examination: General exam: Awake, laying in bed, in nad Respiratory system: Normal respiratory effort, no wheezing Cardiovascular system: regular rate, s1, s2 Gastrointestinal system: Soft, nondistended, positive BS Central nervous system: CN2-12 grossly intact, strength intact Extremities: Perfused, no clubbing Skin: Normal skin turgor, no notable skin lesions seen Psychiatry: Mood normal // no visual hallucinations   Data Reviewed: I have personally reviewed following labs and imaging studies  CBC: Recent Labs  Lab 02/08/21 2239 02/10/21 0847 02/11/21 0712 02/12/21 0626  WBC 13.4* 8.6 11.9* 15.7*  HGB 11.0* 9.8* 10.2* 10.0*  HCT 36.5 32.6* 34.2* 33.9*  MCV 93.4 92.1 94.0 92.9  PLT 234 210 241 502    Basic Metabolic Panel: Recent Labs  Lab 02/08/21 2239 02/10/21 0847 02/11/21 0712 02/12/21 0626  NA 139 138 142 140  K 4.7 4.3 4.7 4.8  CL 105 105 101 103  CO2 25 24 28 23   GLUCOSE 156* 166* 146* 145*  BUN 84* 93* 96* 108*  CREATININE 4.59* 5.34* 5.87* 5.93*  CALCIUM 9.2 8.7* 9.3 9.8    GFR: Estimated Creatinine Clearance: 16 mL/min (A) (by C-G formula based on SCr of 5.93 mg/dL (H)). Liver Function Tests: Recent Labs  Lab 02/10/21 0847 02/11/21 0712 02/12/21 0626  AST 12* 14* 13*  ALT 14 16 16   ALKPHOS 89 109 97  BILITOT 0.6 0.7 0.6  PROT 7.1 7.7 7.6  ALBUMIN 3.2* 3.2* 3.4*    No results for input(s): LIPASE, AMYLASE in the last 168 hours. No results for input(s): AMMONIA in the last 168 hours. Coagulation Profile: No results for input(s): INR, PROTIME in the last 168  hours. Cardiac Enzymes: No results for input(s): CKTOTAL, CKMB, CKMBINDEX, TROPONINI in the last 168 hours. BNP (last 3 results) No results for input(s): PROBNP in the last 8760 hours. HbA1C: No results for input(s): HGBA1C in the last 72 hours.  CBG: Recent Labs  Lab 02/11/21 1715 02/11/21 2032 02/12/21 0756 02/12/21 1138 02/12/21 1630  GLUCAP 116* 146* 135* 170* 133*    Lipid Profile: No results for input(s): CHOL, HDL, LDLCALC, TRIG, CHOLHDL, LDLDIRECT in the last 72 hours. Thyroid Function Tests: No results for input(s): TSH, T4TOTAL, FREET4, T3FREE, THYROIDAB in the last 72 hours.  Anemia Panel: No results for input(s): VITAMINB12, FOLATE, FERRITIN, TIBC, IRON, RETICCTPCT in the last 72 hours. Sepsis Labs: Recent Labs  Lab 02/09/21 2239  PROCALCITON <0.10     Recent Results (from the past 240 hour(s))  Resp Panel by RT-PCR (Flu A&B, Covid) Throat     Status: None   Collection Time: 02/08/21 10:21 PM   Specimen: Throat;  Nasopharyngeal(NP) swabs in vial transport medium  Result Value Ref Range Status   SARS Coronavirus 2 by RT PCR NEGATIVE NEGATIVE Final    Comment: (NOTE) SARS-CoV-2 target nucleic acids are NOT DETECTED.  The SARS-CoV-2 RNA is generally detectable in upper respiratory specimens during the acute phase of infection. The lowest concentration of SARS-CoV-2 viral copies this assay can detect is 138 copies/mL. A negative result does not preclude SARS-Cov-2 infection and should not be used as the sole basis for treatment or other patient management decisions. A negative result may occur with  improper specimen collection/handling, submission of specimen other than nasopharyngeal swab, presence of viral mutation(s) within the areas targeted by this assay, and inadequate number of viral copies(<138 copies/mL). A negative result must be combined with clinical observations, patient history, and epidemiological information. The expected result is  Negative.  Fact Sheet for Patients:  EntrepreneurPulse.com.au  Fact Sheet for Healthcare Providers:  IncredibleEmployment.be  This test is no t yet approved or cleared by the [place] FDA and  has been authorized for detection and/or diagnosis of SARS-CoV-2 by FDA under an Emergency Use Authorization (EUA). This EUA will remain  in effect (meaning this test can be used) for the duration of the COVID-19 declaration under Section 564(b)(1) of the Act, 21 U.S.C.section 360bbb-3(b)(1), unless the authorization is terminated  or revoked sooner.       Influenza A by PCR NEGATIVE NEGATIVE Final   Influenza B by PCR NEGATIVE NEGATIVE Final    Comment: (NOTE) The Xpert Xpress SARS-CoV-2/FLU/RSV plus assay is intended as an aid in the diagnosis of influenza from Nasopharyngeal swab specimens and should not be used as a sole basis for treatment. Nasal washings and aspirates are unacceptable for Xpert Xpress SARS-CoV-2/FLU/RSV testing.  Fact Sheet for Patients: EntrepreneurPulse.com.au  Fact Sheet for Healthcare Providers: IncredibleEmployment.be  This test is not yet approved or cleared by the [place] FDA and has been authorized for detection and/or diagnosis of SARS-CoV-2 by FDA under an Emergency Use Authorization (EUA). This EUA will remain in effect (meaning this test can be used) for the duration of the COVID-19 declaration under Section 564(b)(1) of the Act, 21 U.S.C. section 360bbb-3(b)(1), unless the authorization is terminated or revoked.  Performed at [facility], [place], [place] [address]   Group A Strep by PCR [facility] Only)     Status: None   Collection Time: 02/08/21 10:21 PM   Specimen: Throat; Sterile Swab  Result Value Ref Range Status   Group A Strep by PCR NOT DETECTED NOT DETECTED Final    Comment: Performed at [facility], [address], [place], [place] [address]      Radiology Studies: CT ABDOMEN PELVIS WO CONTRAST  Result Date: 02/12/2021 CLINICAL DATA:  Hydronephrosis. EXAM: CT ABDOMEN AND PELVIS WITHOUT CONTRAST TECHNIQUE: Multidetector CT imaging of the abdomen and pelvis was performed following the standard protocol without IV contrast. COMPARISON:  February 09, 2021.  February 09, 2020. FINDINGS: Lower chest: Bibasilar subsegmental atelectasis is noted. Hepatobiliary: No focal liver abnormality is seen. No gallstones, gallbladder wall thickening, or biliary dilatation. Pancreas: Unremarkable. No pancreatic ductal dilatation or surrounding inflammatory changes. Spleen: Normal in size without focal abnormality. Adrenals/Urinary Tract: Stable 2.5 cm left adrenal nodule is noted. Right adrenal gland appears normal. Mild left hydronephrosis is noted secondary to 7 mm calculus at left ureteropelvic junction. 1.9 cm hyperdense rounded abnormality is seen involving upper pole of right kidney which most likely corresponds to cyst  described on recent ultrasound. Urinary bladder is decompressed. Stomach/Bowel: Stomach is within normal limits. Appendix appears normal. No evidence of bowel wall thickening, distention, or inflammatory changes. Vascular/Lymphatic: Aortic atherosclerosis. No enlarged abdominal or pelvic lymph nodes. Reproductive: Uterus and bilateral adnexa are unremarkable. Other: No abdominal wall hernia or abnormality. No abdominopelvic ascites. Musculoskeletal: No acute or significant osseous findings. IMPRESSION: Mild left hydronephrosis is noted secondary to 7 mm calculus at the left ureteropelvic junction. Stable 2.5 cm left adrenal nodule is noted. Follow-up CT or MRI in 1 year is recommended to ensure stability. Aortic Atherosclerosis (ICD10-I70.0). Electronically Signed   By: Marijo Conception M.D.   On: 02/12/2021 16:40    Scheduled Meds:  amLODipine  5 mg Oral BID   vitamin C  500 mg Oral BID   atorvastatin  20 mg Oral  QHS   azithromycin  500 mg Oral Daily   calcitRIOL  0.25 mcg Oral Daily   calcium carbonate  500 mg of elemental calcium Oral Daily   cloNIDine  0.2 mg Oral BID   folic acid  4,742 mcg Oral Daily   heparin injection (subcutaneous)  5,000 Units Subcutaneous Q8H   insulin aspart  0-15 Units Subcutaneous TID WC   insulin aspart  4 Units Subcutaneous TID WC   levothyroxine  112 mcg Oral QAC breakfast   melatonin  10 mg Oral QHS   metoprolol tartrate  75 mg Oral BID   nystatin   Topical BID   polyethylene glycol  17 g Oral Daily   potassium chloride SA  20 mEq Oral Daily   sodium chloride flush  3 mL Intravenous Q12H   Vitamin D (Ergocalciferol)  50,000 Units Oral Q Fri   Continuous Infusions:  sodium chloride     cefTRIAXone (ROCEPHIN)  IV 2 g (02/11/21 1940)     LOS: 3 days   Marylu Lund, MD [facility] Pager On Amion  If 7PM-7AM, please contact night-coverage 02/12/2021, 5:25 PM

## 2021-02-12 NOTE — Progress Notes (Signed)
[facility]  ROUNDING NOTE   Subjective:   Nicole Schmidt is a 55 year old female with a past medical history of anemia, hypertension, dyslipidemia, diabetes, CHF, and chronic kidney disease.  Patient presents to the hospital with complaints of shortness of breath and sore throat which started late last week.  Patient has been admitted for Viral URI [J06.9] Cardiorenal syndrome with renal failure [I13.10] Cardiorenal syndrome with renal failure, stage 1-4 or unspecified chronic kidney disease, with heart failure ([place]) [I13.0]  Patient is known to our practice and is followed by Dr. Holley Raring.  Patient was last seen in our office in November 2022.    Patient seen today on shortness patient seen today on second floor Patient main concerns continues to be that she feels short of breath on activity Patient offers no complaint of change in appetite No complaint of change in taste I did discuss with the patient about her kidney related issues.  Educated patient with worsening of her kidney disease. Reviewed patient with renal ultrasound results Discussed the patient about need for a CT scan.   Objective:  Vital signs in last 24 hours:  Temp:  [97.9 F (36.6 C)-98.8 F (37.1 C)] 98.3 F (36.8 C) (12/31 0511) Pulse Rate:  [81-87] 87 (12/31 0511) Resp:  [16-19] 18 (12/31 0511) BP: (120-142)/(52-89) 142/77 (12/31 0511) SpO2:  [89 %-94 %] 94 % (12/31 0511) Weight:  [160.1 kg] 160.1 kg (12/31 0139)  Weight change: 0.136 kg Filed Weights   02/10/21 1949 02/11/21 0503 02/12/21 0139  Weight: (!) 160 kg (!) 159.2 kg (!) 160.1 kg    Intake/Output: I/O last 3 completed shifts: In: 580 [P.O.:580] Out: 2150 [Urine:2150]   Intake/Output this shift:  No intake/output data recorded.  Physical Exam: General: NAD,sitting at bedside  Head: Normocephalic, atraumatic. Moist oral mucosal membranes  Eyes: Anicteric  Neck: Supple, trachea midline  Lungs:  Mild Rhonchi and wheeze,  normal effort, 4L O2, cough  Heart: Regular rate and rhythm  Abdomen:  Soft, nontender  Extremities:  no peripheral edema.  Neurologic: Nonfocal, moving all four extremities  Skin: No lesions       Basic Metabolic Panel: Recent Labs  Lab 02/08/21 2239 02/10/21 0847 02/11/21 0712  NA 139 138 142  K 4.7 4.3 4.7  CL 105 105 101  CO2 25 24 28   GLUCOSE 156* 166* 146*  BUN 84* 93* 96*  CREATININE 4.59* 5.34* 5.87*  CALCIUM 9.2 8.7* 9.3    Liver Function Tests: Recent Labs  Lab 02/10/21 0847 02/11/21 0712  AST 12* 14*  ALT 14 16  ALKPHOS 89 109  BILITOT 0.6 0.7  PROT 7.1 7.7  ALBUMIN 3.2* 3.2*   No results for input(s): LIPASE, AMYLASE in the last 168 hours. No results for input(s): AMMONIA in the last 168 hours.  CBC: Recent Labs  Lab 02/08/21 2239 02/10/21 0847 02/11/21 0712 02/12/21 0626  WBC 13.4* 8.6 11.9* 15.7*  HGB 11.0* 9.8* 10.2* 10.0*  HCT 36.5 32.6* 34.2* 33.9*  MCV 93.4 92.1 94.0 92.9  PLT 234 210 241 229    Cardiac Enzymes: No results for input(s): CKTOTAL, CKMB, CKMBINDEX, TROPONINI in the last 168 hours.  BNP: Invalid input(s): POCBNP  CBG: Recent Labs  Lab 02/10/21 2154 02/11/21 0818 02/11/21 1206 02/11/21 1715 02/11/21 2032  GLUCAP 142* 122* 134* 116* 146*    Microbiology: Results for orders placed or performed during the hospital encounter of 02/09/21  Resp Panel by RT-PCR (Flu A&B, Covid) Throat  Status: None   Collection Time: 02/08/21 10:21 PM   Specimen: Throat; Nasopharyngeal(NP) swabs in vial transport medium  Result Value Ref Range Status   SARS Coronavirus 2 by RT PCR NEGATIVE NEGATIVE Final    Comment: (NOTE) SARS-CoV-2 target nucleic acids are NOT DETECTED.  The SARS-CoV-2 RNA is generally detectable in upper respiratory specimens during the acute phase of infection. The lowest concentration of SARS-CoV-2 viral copies this assay can detect is 138 copies/mL. A negative result does not preclude  SARS-Cov-2 infection and should not be used as the sole basis for treatment or other patient management decisions. A negative result may occur with  improper specimen collection/handling, submission of specimen other than nasopharyngeal swab, presence of viral mutation(s) within the areas targeted by this assay, and inadequate number of viral copies(<138 copies/mL). A negative result must be combined with clinical observations, patient history, and epidemiological information. The expected result is Negative.  Fact Sheet for Patients:  EntrepreneurPulse.com.au  Fact Sheet for Healthcare Providers:  IncredibleEmployment.be  This test is no t yet approved or cleared by the [place] FDA and  has been authorized for detection and/or diagnosis of SARS-CoV-2 by FDA under an Emergency Use Authorization (EUA). This EUA will remain  in effect (meaning this test can be used) for the duration of the COVID-19 declaration under Section 564(b)(1) of the Act, 21 U.S.C.section 360bbb-3(b)(1), unless the authorization is terminated  or revoked sooner.       Influenza A by PCR NEGATIVE NEGATIVE Final   Influenza B by PCR NEGATIVE NEGATIVE Final    Comment: (NOTE) The Xpert Xpress SARS-CoV-2/FLU/RSV plus assay is intended as an aid in the diagnosis of influenza from Nasopharyngeal swab specimens and should not be used as a sole basis for treatment. Nasal washings and aspirates are unacceptable for Xpert Xpress SARS-CoV-2/FLU/RSV testing.  Fact Sheet for Patients: EntrepreneurPulse.com.au  Fact Sheet for Healthcare Providers: IncredibleEmployment.be  This test is not yet approved or cleared by the [place] FDA and has been authorized for detection and/or diagnosis of SARS-CoV-2 by FDA under an Emergency Use Authorization (EUA). This EUA will remain in effect (meaning this test can be used) for the duration of  the COVID-19 declaration under Section 564(b)(1) of the Act, 21 U.S.C. section 360bbb-3(b)(1), unless the authorization is terminated or revoked.  Performed at [facility], [place], [place] [address]   Group A Strep by PCR [facility] Only)     Status: None   Collection Time: 02/08/21 10:21 PM   Specimen: Throat; Sterile Swab  Result Value Ref Range Status   Group A Strep by PCR NOT DETECTED NOT DETECTED Final    Comment: Performed at [facility], [address] [place], [place] [address]    Coagulation Studies: No results for input(s): LABPROT, INR in the last 72 hours.  Urinalysis: Recent Labs    02/09/21 1340  COLORURINE YELLOW*  LABSPEC 1.008  PHURINE 5.0  GLUCOSEU NEGATIVE  HGBUR MODERATE*  BILIRUBINUR NEGATIVE  KETONESUR NEGATIVE  PROTEINUR 30*  NITRITE NEGATIVE  LEUKOCYTESUR NEGATIVE      Imaging: No results found.   Medications:    sodium chloride     cefTRIAXone (ROCEPHIN)  IV 2 g (02/11/21 1940)    amLODipine  5 mg Oral BID   vitamin C  500 mg Oral BID   atorvastatin  20 mg Oral QHS   azithromycin  500 mg Oral Daily   calcitRIOL  0.25 mcg Oral Daily   calcium  carbonate  500 mg of elemental calcium Oral Daily   cloNIDine  0.2 mg Oral BID   folic acid  4,709 mcg Oral Daily   heparin injection (subcutaneous)  5,000 Units Subcutaneous Q8H   insulin aspart  0-15 Units Subcutaneous TID WC   insulin aspart  4 Units Subcutaneous TID WC   levothyroxine  112 mcg Oral QAC breakfast   melatonin  10 mg Oral QHS   metoprolol tartrate  75 mg Oral BID   nystatin   Topical BID   polyethylene glycol  17 g Oral Daily   potassium chloride SA  20 mEq Oral Daily   sodium chloride flush  3 mL Intravenous Q12H   Vitamin D (Ergocalciferol)  50,000 Units Oral Q Fri   sodium chloride, acetaminophen, albuterol, guaiFENesin-dextromethorphan, ondansetron (ZOFRAN) IV, sodium chloride flush  Assessment/ Plan:  [name] is a 55  y.o.  female with a past medical history of anemia, hypertension, dyslipidemia, diabetes, CHF, and chronic kidney disease.  Patient presents to the hospital with complaints of shortness of breath and sore throat which started late last week.  Patient has been admitted for Viral URI [J06.9] Cardiorenal syndrome with renal failure [I13.10] Cardiorenal syndrome with renal failure, stage 1-4 or unspecified chronic kidney disease, with heart failure ([place]) [I13.0]   Acute Kidney Injury on chronic kidney disease stage IV .  Patient has AKI secondary to ATN Patient AKI could also have some contribution from obstruction as patient renal ultrasound did show mild left hydronephrosis with recommended CT imaging follow-up.  Patient has AKI on CKD stage IV Patient has CKD secondary to diabetes mellitus Patient has CKD going back to 2014 when patient had a creatinine of around 1.4 Patient's creatinine is now at around 2.4 as an outpatient No acute indication of dialysis at this time.   Patient AKI had worsened with creatinine rising to 5.8.  We will thought process that patient could have been overdiuresis Patient diuretics were held  Will ask for CT scan without contrast Look for/rule out obstruction issues No acute need for renal replacement therapy today We will continue to follow closely  Lab Results  Component Value Date   CREATININE 5.87 (H) 02/11/2021   CREATININE 5.34 (H) 02/10/2021   CREATININE 4.59 (H) 02/08/2021    Intake/Output Summary (Last 24 hours) at 02/12/2021 0717 Last data filed at 02/12/2021 0100 Gross per 24 hour  Intake 480 ml  Output 1350 ml  Net -870 ml   2. Anemia of chronic kidney disease Lab Results  Component Value Date   HGB 10.0 (L) 02/12/2021    Hemoglobin at goal  3. Secondary Hyperparathyroidism:  Lab Results  Component Value Date   PTH 94 (H) 11/18/2017   CALCIUM 9.3 02/11/2021   PHOS 3.8 03/29/2018  Calcium and phosphorus within acceptable  range. Continue home dosing of calcium carbonate, calcitriol, vitamin C and cholecalciferol.  4.  Hypertension with chronic kidney disease.  Blood pressure is at goal we will continue patient on  current antihypertensive regimen   Plan Patient is 55 year old female with complicated medical history of acute kidney injury, CKD stage IV, anemia of chronic disease, secondary hyperparathyroidism, hypertension now with left-sided hydronephrosis. Will ask for CT scan.  we will hold off on IV fluids We will hold off on diuretics I did educate patient about possible need for renal replacement therapy soon   LOS: 3 Nicole Schmidt Sheek s Aneshia Jacquet 12/31/20227:17 AM

## 2021-02-13 DIAGNOSIS — I482 Chronic atrial fibrillation, unspecified: Secondary | ICD-10-CM | POA: Diagnosis not present

## 2021-02-13 DIAGNOSIS — I130 Hypertensive heart and chronic kidney disease with heart failure and stage 1 through stage 4 chronic kidney disease, or unspecified chronic kidney disease: Secondary | ICD-10-CM | POA: Diagnosis not present

## 2021-02-13 DIAGNOSIS — J40 Bronchitis, not specified as acute or chronic: Secondary | ICD-10-CM | POA: Diagnosis not present

## 2021-02-13 LAB — COMPREHENSIVE METABOLIC PANEL
ALT: 18 U/L (ref 0–44)
AST: 15 U/L (ref 15–41)
Albumin: 3.1 g/dL — ABNORMAL LOW (ref 3.5–5.0)
Alkaline Phosphatase: 99 U/L (ref 38–126)
Anion gap: 11 (ref 5–15)
BUN: 109 mg/dL — ABNORMAL HIGH (ref 6–20)
CO2: 24 mmol/L (ref 22–32)
Calcium: 9.6 mg/dL (ref 8.9–10.3)
Chloride: 101 mmol/L (ref 98–111)
Creatinine, Ser: 5.65 mg/dL — ABNORMAL HIGH (ref 0.44–1.00)
GFR, Estimated: 8 mL/min — ABNORMAL LOW (ref >60)
Glucose, Bld: 148 mg/dL — ABNORMAL HIGH (ref 70–99)
Potassium: 4.5 mmol/L (ref 3.5–5.1)
Sodium: 136 mmol/L (ref 135–145)
Total Bilirubin: 0.6 mg/dL (ref 0.3–1.2)
Total Protein: 7.4 g/dL (ref 6.5–8.1)

## 2021-02-13 LAB — GLUCOSE, CAPILLARY
Glucose-Capillary: 149 mg/dL — ABNORMAL HIGH (ref 70–99)
Glucose-Capillary: 206 mg/dL — ABNORMAL HIGH (ref 70–99)
Glucose-Capillary: 131 mg/dL — ABNORMAL HIGH (ref 70–99)
Glucose-Capillary: 139 mg/dL — ABNORMAL HIGH (ref 70–99)

## 2021-02-13 MED ORDER — BISACODYL 10 MG RE SUPP
10.0000 mg | Freq: Once | RECTAL | Status: AC
  Administered 2021-02-13: 10 mg via RECTAL
  Filled 2021-02-13: qty 1

## 2021-02-13 NOTE — Progress Notes (Signed)
PROGRESS NOTE    Nicole Schmidt  QQI:[id number] DOB: 1965-12-15 DOA: 02/09/2021 PCP: Lenard Simmer, MD    Brief Narrative:  56 year old female presented to emergency department with complaint of sore throat/shortness of breath, increasing over past several days prior to presentation.  Was concerned about possible bronchitis.  Patient reports she usually sleeps in a recliner, has noticed breathing a bit worse but has not tried to lay flat.  Patient reports she has consumed a bit more salt recently and possibly more fluid than usual since she has been feeling ill, but she reports adherence to home medication regimen. Is non-smoker/never-smoker, previous occupational respiratory exposure and at some point was told she may have COPD but no formal diagnosis of this, she does see pulmonary for chronic respiratory failure requiring 2L O2 via [place] at home, and OSA.Marland Kitchen At presentation, pt was noted to Cr over 4.5 with concerns of cardiorenal syndrome. Pt was admitted for further workup  Assessment & Plan:   Principal Problem:   Cardiorenal syndrome, stage 1-4 or unspecified chronic kidney disease, with heart failure (HCC) Active Problems:   History of pulmonary embolus (PE)   Acute renal failure superimposed on stage 3 chronic kidney disease (HCC)   Iron deficiency anemia   Goals of care, counseling/discussion   OSA on CPAP   Atrial fibrillation, chronic (HCC)   Dyslipidemia   Diabetes mellitus type 2 in obese [facility])   Acquired hypothyroidism   Physical deconditioning   Chronic respiratory failure with hypoxia (HCC)   Bronchitis  Stage 4 chronic kidney disease, with heart failure (HCC) Recently given gentle diuresis, fluid restriction, strict I&O echocardiogram with EF 60-65% renal ultrasound performed  Held ACE/ARB, avoid other nephrotoxic medications/contrast if possible At time of presentation, held home Eliquis, transition to heparin while inpatient/pending renal improvement Cr remains  elevated at 5.65 Appreciate input by Nephrology, mild L hydronephrosis with 48mm stone noted. Nephrology recs for discussing with Urology tomorrow if still in-house Pt is voiding. Per nephrology, possible need for RRT soon Repeat bmet in AM   Chronic respiratory failure with hypoxia ([place]) no known COPD, consider outpatient follow-up with PFT if not done recently.  Continued on PRN bronchodilators as needed On 4LNC, wean O2 as toleratd   Acquired hypothyroidism TSH 0.267 Will check free T4 in AM   Diabetes mellitus type 2 in obese (HCC) Holding home GLP-1, placed on sliding scale insulin Glycemic trends stable A1c 7.2   Dyslipidemia Continue statin   Atrial fibrillation, chronic (HCC) Stable, continue beta-blocker  Held Eliquis at presentation secondary to renal function   History of pulmonary embolus (PE) Low suspicion for recurrence, patient reports compliance with home anticoagulation Appeared stable at present Stable at present   Iron deficiency anemia Monitor CBC   OSA on CPAP CPAP ordered nightly  PNA -Presenting CXR reviewed. Findings of lower infiltrates -WBC is up to 11.9k this AM -Will start empiric azithro and rocephin for PNA -On 4LNC. Wean O2 as tolerated   DVT prophylaxis: heparin Code Status: Full Family Communication: Pt in room, family not at bedside  Status is: Inpatient  Remains inpatient appropriate because: Severity of illness    Consultants:  nephrology  Procedures:    Antimicrobials: Anti-infectives (From admission, onward)    Start     Dose/Rate Route Frequency Ordered Stop   02/11/21 1800  cefTRIAXone (ROCEPHIN) 2 g in sodium chloride 0.9 % 100 mL IVPB        2 g 200 mL/hr over 30 Minutes Intravenous  Every 24 hours 02/11/21 1710 02/16/21 1759   02/11/21 1800  azithromycin (ZITHROMAX) tablet 500 mg        500 mg Oral Daily 02/11/21 1710 02/16/21 0959       Subjective: No complaints this AM. Denies sob or abd  pain  Objective: Vitals:   02/13/21 0511 02/13/21 0750 02/13/21 1221 02/13/21 1656  BP: (!) 148/88 135/69 134/75 (!) 156/88  Pulse: 77 80 82 (!) 40  Resp: 19 14 17 17   Temp: 97.7 F (36.5 C) 98.3 F (36.8 C) 98.2 F (36.8 C) 98.2 F (36.8 C)  TempSrc:      SpO2: 93%  96% (!) 71%  Weight: (!) 162.2 kg     Height:        Intake/Output Summary (Last 24 hours) at 02/13/2021 1757 Last data filed at 02/13/2021 1600 Gross per 24 hour  Intake 243 ml  Output 1350 ml  Net -1107 ml    Filed Weights   02/11/21 0503 02/12/21 0139 02/13/21 0511  Weight: (!) 159.2 kg (!) 160.1 kg (!) 162.2 kg    Examination: General exam: Conversant, in no acute distress Respiratory system: normal chest rise, clear, no audible wheezing Cardiovascular system: regular rhythm, s1-s2 Gastrointestinal system: Nondistended, nontender, pos BS Central nervous system: No seizures, no tremors Extremities: No cyanosis, no joint deformities Skin: No rashes, no pallor Psychiatry: Affect normal // no auditory hallucinations   Data Reviewed: I have personally reviewed following labs and imaging studies  CBC: Recent Labs  Lab 02/08/21 2239 02/10/21 0847 02/11/21 0712 02/12/21 0626  WBC 13.4* 8.6 11.9* 15.7*  HGB 11.0* 9.8* 10.2* 10.0*  HCT 36.5 32.6* 34.2* 33.9*  MCV 93.4 92.1 94.0 92.9  PLT 234 210 241 865    Basic Metabolic Panel: Recent Labs  Lab 02/08/21 2239 02/10/21 0847 02/11/21 0712 02/12/21 0626 02/13/21 0538  NA 139 138 142 140 136  K 4.7 4.3 4.7 4.8 4.5  CL 105 105 101 103 101  CO2 25 24 28 23 24   GLUCOSE 156* 166* 146* 145* 148*  BUN 84* 93* 96* 108* 109*  CREATININE 4.59* 5.34* 5.87* 5.93* 5.65*  CALCIUM 9.2 8.7* 9.3 9.8 9.6    GFR: Estimated Creatinine Clearance: 16.9 mL/min (A) (by C-G formula based on SCr of 5.65 mg/dL (H)). Liver Function Tests: Recent Labs  Lab 02/10/21 0847 02/11/21 0712 02/12/21 0626 02/13/21 0538  AST 12* 14* 13* 15  ALT 14 16 16 18   ALKPHOS 89  109 97 99  BILITOT 0.6 0.7 0.6 0.6  PROT 7.1 7.7 7.6 7.4  ALBUMIN 3.2* 3.2* 3.4* 3.1*    No results for input(s): LIPASE, AMYLASE in the last 168 hours. No results for input(s): AMMONIA in the last 168 hours. Coagulation Profile: No results for input(s): INR, PROTIME in the last 168 hours. Cardiac Enzymes: No results for input(s): CKTOTAL, CKMB, CKMBINDEX, TROPONINI in the last 168 hours. BNP (last 3 results) No results for input(s): PROBNP in the last 8760 hours. HbA1C: No results for input(s): HGBA1C in the last 72 hours.  CBG: Recent Labs  Lab 02/12/21 1630 02/12/21 2119 02/13/21 0749 02/13/21 1217 02/13/21 1655  GLUCAP 133* 122* 149* 206* 131*    Lipid Profile: No results for input(s): CHOL, HDL, LDLCALC, TRIG, CHOLHDL, LDLDIRECT in the last 72 hours. Thyroid Function Tests: No results for input(s): TSH, T4TOTAL, FREET4, T3FREE, THYROIDAB in the last 72 hours.  Anemia Panel: No results for input(s): VITAMINB12, FOLATE, FERRITIN, TIBC, IRON, RETICCTPCT in the last 72  hours. Sepsis Labs: Recent Labs  Lab 02/09/21 2239  PROCALCITON <0.10     Recent Results (from the past 240 hour(s))  Resp Panel by RT-PCR (Flu A&B, Covid) Throat     Status: None   Collection Time: 02/08/21 10:21 PM   Specimen: Throat; Nasopharyngeal(NP) swabs in vial transport medium  Result Value Ref Range Status   SARS Coronavirus 2 by RT PCR NEGATIVE NEGATIVE Final    Comment: (NOTE) SARS-CoV-2 target nucleic acids are NOT DETECTED.  The SARS-CoV-2 RNA is generally detectable in upper respiratory specimens during the acute phase of infection. The lowest concentration of SARS-CoV-2 viral copies this assay can detect is 138 copies/mL. A negative result does not preclude SARS-Cov-2 infection and should not be used as the sole basis for treatment or other patient management decisions. A negative result may occur with  improper specimen collection/handling, submission of specimen other than  nasopharyngeal swab, presence of viral mutation(s) within the areas targeted by this assay, and inadequate number of viral copies(<138 copies/mL). A negative result must be combined with clinical observations, patient history, and epidemiological information. The expected result is Negative.  Fact Sheet for Patients:  EntrepreneurPulse.com.au  Fact Sheet for Healthcare Providers:  IncredibleEmployment.be  This test is no t yet approved or cleared by the [place] FDA and  has been authorized for detection and/or diagnosis of SARS-CoV-2 by FDA under an Emergency Use Authorization (EUA). This EUA will remain  in effect (meaning this test can be used) for the duration of the COVID-19 declaration under Section 564(b)(1) of the Act, 21 U.S.C.section 360bbb-3(b)(1), unless the authorization is terminated  or revoked sooner.       Influenza A by PCR NEGATIVE NEGATIVE Final   Influenza B by PCR NEGATIVE NEGATIVE Final    Comment: (NOTE) The Xpert Xpress SARS-CoV-2/FLU/RSV plus assay is intended as an aid in the diagnosis of influenza from Nasopharyngeal swab specimens and should not be used as a sole basis for treatment. Nasal washings and aspirates are unacceptable for Xpert Xpress SARS-CoV-2/FLU/RSV testing.  Fact Sheet for Patients: EntrepreneurPulse.com.au  Fact Sheet for Healthcare Providers: IncredibleEmployment.be  This test is not yet approved or cleared by the [place] FDA and has been authorized for detection and/or diagnosis of SARS-CoV-2 by FDA under an Emergency Use Authorization (EUA). This EUA will remain in effect (meaning this test can be used) for the duration of the COVID-19 declaration under Section 564(b)(1) of the Act, 21 U.S.C. section 360bbb-3(b)(1), unless the authorization is terminated or revoked.  Performed at [facility], [place], [place]  [id number]   Group A Strep by PCR [facility] Only)     Status: None   Collection Time: 02/08/21 10:21 PM   Specimen: Throat; Sterile Swab  Result Value Ref Range Status   Group A Strep by PCR NOT DETECTED NOT DETECTED Final    Comment: Performed at [facility], [address], [place], [place] [address]      Radiology Studies: CT ABDOMEN PELVIS WO CONTRAST  Result Date: 02/12/2021 CLINICAL DATA:  Hydronephrosis. EXAM: CT ABDOMEN AND PELVIS WITHOUT CONTRAST TECHNIQUE: Multidetector CT imaging of the abdomen and pelvis was performed following the standard protocol without IV contrast. COMPARISON:  February 09, 2021.  February 09, 2020. FINDINGS: Lower chest: Bibasilar subsegmental atelectasis is noted. Hepatobiliary: No focal liver abnormality is seen. No gallstones, gallbladder wall thickening, or biliary dilatation. Pancreas: Unremarkable. No pancreatic ductal dilatation or surrounding inflammatory changes. Spleen: Normal in size without focal  abnormality. Adrenals/Urinary Tract: Stable 2.5 cm left adrenal nodule is noted. Right adrenal gland appears normal. Mild left hydronephrosis is noted secondary to 7 mm calculus at left ureteropelvic junction. 1.9 cm hyperdense rounded abnormality is seen involving upper pole of right kidney which most likely corresponds to cyst described on recent ultrasound. Urinary bladder is decompressed. Stomach/Bowel: Stomach is within normal limits. Appendix appears normal. No evidence of bowel wall thickening, distention, or inflammatory changes. Vascular/Lymphatic: Aortic atherosclerosis. No enlarged abdominal or pelvic lymph nodes. Reproductive: Uterus and bilateral adnexa are unremarkable. Other: No abdominal wall hernia or abnormality. No abdominopelvic ascites. Musculoskeletal: No acute or significant osseous findings. IMPRESSION: Mild left hydronephrosis is noted secondary to 7 mm calculus at the left ureteropelvic junction. Stable 2.5 cm left adrenal nodule is  noted. Follow-up CT or MRI in 1 year is recommended to ensure stability. Aortic Atherosclerosis (ICD10-I70.0). Electronically Signed   By: Marijo Conception M.D.   On: 02/12/2021 16:40    Scheduled Meds:  amLODipine  5 mg Oral BID   vitamin C  500 mg Oral BID   atorvastatin  20 mg Oral QHS   azithromycin  500 mg Oral Daily   calcitRIOL  0.25 mcg Oral Daily   calcium carbonate  500 mg of elemental calcium Oral Daily   cloNIDine  0.2 mg Oral BID   folic acid  9,201 mcg Oral Daily   heparin injection (subcutaneous)  5,000 Units Subcutaneous Q8H   insulin aspart  0-15 Units Subcutaneous TID WC   insulin aspart  4 Units Subcutaneous TID WC   levothyroxine  112 mcg Oral QAC breakfast   melatonin  10 mg Oral QHS   metoprolol tartrate  75 mg Oral BID   nystatin   Topical BID   polyethylene glycol  17 g Oral Daily   potassium chloride SA  20 mEq Oral Daily   sodium chloride flush  3 mL Intravenous Q12H   Vitamin D (Ergocalciferol)  50,000 Units Oral Q Fri   Continuous Infusions:  sodium chloride     cefTRIAXone (ROCEPHIN)  IV 2 g (02/13/21 1744)     LOS: 4 days   Marylu Lund, MD [facility] Pager On Amion  If 7PM-7AM, please contact night-coverage 02/13/2021, 5:57 PM

## 2021-02-13 NOTE — Progress Notes (Addendum)
[facility]  ROUNDING NOTE   Subjective:   Nicole Schmidt is a 56 year old female with a past medical history of anemia, hypertension, dyslipidemia, diabetes, CHF, and chronic kidney disease.  Patient presents to the hospital with complaints of shortness of breath and sore throat which started late last week.  Patient has been admitted for Viral URI [J06.9] Cardiorenal syndrome with renal failure [I13.10] Cardiorenal syndrome with renal failure, stage 1-4 or unspecified chronic kidney disease, with heart failure ([place]) [I13.0]  Patient is known to our practice and is followed by Dr. Holley Raring.  Patient was last seen in our office in November 2022.    Patient seen today on shortness patient seen today on second floor Patient offers no complaint of change in appetite No complaint of change in taste I did discuss with the patient about her kidney related issues.  Educated patient about minimal improvement in her kidney function. I did discuss with the patient about her CT scan results. Discussed the patient about possible need for urology consult as an outpatient versus inpatient. I did discuss the patient with possible need for renal placement therapy    Objective:  Vital signs in last 24 hours:  Temp:  [97.7 F (36.5 C)-98.5 F (36.9 C)] 98.3 F (36.8 C) (01/01 0750) Pulse Rate:  [77-91] 80 (01/01 0750) Resp:  [14-19] 14 (01/01 0750) BP: (135-162)/(63-88) 135/69 (01/01 0750) SpO2:  [92 %-96 %] 93 % (01/01 0511) Weight:  [162.2 kg] 162.2 kg (01/01 0511)  Weight change: 2.041 kg Filed Weights   02/11/21 0503 02/12/21 0139 02/13/21 0511  Weight: (!) 159.2 kg (!) 160.1 kg (!) 162.2 kg    Intake/Output: I/O last 3 completed shifts: In: 1340.1 [P.O.:1240; IV Piggyback:100.1] Out: 1250 [Urine:1250]   Intake/Output this shift:  Total I/O In: 3 [I.V.:3] Out: -   Physical Exam: General: NAD,sitting at bedside  Head: Normocephalic, atraumatic. Moist oral mucosal  membranes  Eyes: Anicteric  Neck: Supple, trachea midline  Lungs:  Mild Rhonchi and wheeze, normal effort, 4L O2, cough  Heart: Regular rate and rhythm  Abdomen:  Soft, nontender  Extremities:  no peripheral edema.  Neurologic: Nonfocal, moving all four extremities  Skin: No lesions       Basic Metabolic Panel: Recent Labs  Lab 02/08/21 2239 02/10/21 0847 02/11/21 0712 02/12/21 0626 02/13/21 0538  NA 139 138 142 140 136  K 4.7 4.3 4.7 4.8 4.5  CL 105 105 101 103 101  CO2 25 24 28 23 24   GLUCOSE 156* 166* 146* 145* 148*  BUN 84* 93* 96* 108* 109*  CREATININE 4.59* 5.34* 5.87* 5.93* 5.65*  CALCIUM 9.2 8.7* 9.3 9.8 9.6    Liver Function Tests: Recent Labs  Lab 02/10/21 0847 02/11/21 0712 02/12/21 0626 02/13/21 0538  AST 12* 14* 13* 15  ALT 14 16 16 18   ALKPHOS 89 109 97 99  BILITOT 0.6 0.7 0.6 0.6  PROT 7.1 7.7 7.6 7.4  ALBUMIN 3.2* 3.2* 3.4* 3.1*   No results for input(s): LIPASE, AMYLASE in the last 168 hours. No results for input(s): AMMONIA in the last 168 hours.  CBC: Recent Labs  Lab 02/08/21 2239 02/10/21 0847 02/11/21 0712 02/12/21 0626  WBC 13.4* 8.6 11.9* 15.7*  HGB 11.0* 9.8* 10.2* 10.0*  HCT 36.5 32.6* 34.2* 33.9*  MCV 93.4 92.1 94.0 92.9  PLT 234 210 241 229    Cardiac Enzymes: No results for input(s): CKTOTAL, CKMB, CKMBINDEX, TROPONINI in the last 168 hours.  BNP: Invalid input(s):  POCBNP  CBG: Recent Labs  Lab 02/12/21 0756 02/12/21 1138 02/12/21 1630 02/12/21 2119 02/13/21 0749  GLUCAP 135* 170* 133* 122* 149*    Microbiology: Results for orders placed or performed during the hospital encounter of 02/09/21  Resp Panel by RT-PCR (Flu A&B, Covid) Throat     Status: None   Collection Time: 02/08/21 10:21 PM   Specimen: Throat; Nasopharyngeal(NP) swabs in vial transport medium  Result Value Ref Range Status   SARS Coronavirus 2 by RT PCR NEGATIVE NEGATIVE Final    Comment: (NOTE) SARS-CoV-2 target nucleic acids are NOT  DETECTED.  The SARS-CoV-2 RNA is generally detectable in upper respiratory specimens during the acute phase of infection. The lowest concentration of SARS-CoV-2 viral copies this assay can detect is 138 copies/mL. A negative result does not preclude SARS-Cov-2 infection and should not be used as the sole basis for treatment or other patient management decisions. A negative result may occur with  improper specimen collection/handling, submission of specimen other than nasopharyngeal swab, presence of viral mutation(s) within the areas targeted by this assay, and inadequate number of viral copies(<138 copies/mL). A negative result must be combined with clinical observations, patient history, and epidemiological information. The expected result is Negative.  Fact Sheet for Patients:  EntrepreneurPulse.com.au  Fact Sheet for Healthcare Providers:  IncredibleEmployment.be  This test is no t yet approved or cleared by the [place] FDA and  has been authorized for detection and/or diagnosis of SARS-CoV-2 by FDA under an Emergency Use Authorization (EUA). This EUA will remain  in effect (meaning this test can be used) for the duration of the COVID-19 declaration under Section 564(b)(1) of the Act, 21 U.S.C.section 360bbb-3(b)(1), unless the authorization is terminated  or revoked sooner.       Influenza A by PCR NEGATIVE NEGATIVE Final   Influenza B by PCR NEGATIVE NEGATIVE Final    Comment: (NOTE) The Xpert Xpress SARS-CoV-2/FLU/RSV plus assay is intended as an aid in the diagnosis of influenza from Nasopharyngeal swab specimens and should not be used as a sole basis for treatment. Nasal washings and aspirates are unacceptable for Xpert Xpress SARS-CoV-2/FLU/RSV testing.  Fact Sheet for Patients: EntrepreneurPulse.com.au  Fact Sheet for Healthcare Providers: IncredibleEmployment.be  This test is not yet  approved or cleared by the [place] FDA and has been authorized for detection and/or diagnosis of SARS-CoV-2 by FDA under an Emergency Use Authorization (EUA). This EUA will remain in effect (meaning this test can be used) for the duration of the COVID-19 declaration under Section 564(b)(1) of the Act, 21 U.S.C. section 360bbb-3(b)(1), unless the authorization is terminated or revoked.  Performed at [facility], [place], [place] [address]   Group A Strep by PCR [facility] Only)     Status: None   Collection Time: 02/08/21 10:21 PM   Specimen: Throat; Sterile Swab  Result Value Ref Range Status   Group A Strep by PCR NOT DETECTED NOT DETECTED Final    Comment: Performed at [facility], [address] [place], [place] [address]    Coagulation Studies: No results for input(s): LABPROT, INR in the last 72 hours.  Urinalysis: No results for input(s): COLORURINE, LABSPEC, PHURINE, GLUCOSEU, HGBUR, BILIRUBINUR, KETONESUR, PROTEINUR, UROBILINOGEN, NITRITE, LEUKOCYTESUR in the last 72 hours.  Invalid input(s): APPERANCEUR     Imaging: CT ABDOMEN PELVIS WO CONTRAST  Result Date: 02/12/2021 CLINICAL DATA:  Hydronephrosis. EXAM: CT ABDOMEN AND PELVIS WITHOUT CONTRAST TECHNIQUE: Multidetector CT imaging of the abdomen and pelvis  was performed following the standard protocol without IV contrast. COMPARISON:  February 09, 2021.  February 09, 2020. FINDINGS: Lower chest: Bibasilar subsegmental atelectasis is noted. Hepatobiliary: No focal liver abnormality is seen. No gallstones, gallbladder wall thickening, or biliary dilatation. Pancreas: Unremarkable. No pancreatic ductal dilatation or surrounding inflammatory changes. Spleen: Normal in size without focal abnormality. Adrenals/Urinary Tract: Stable 2.5 cm left adrenal nodule is noted. Right adrenal gland appears normal. Mild left hydronephrosis is noted secondary to 7 mm calculus at left ureteropelvic  junction. 1.9 cm hyperdense rounded abnormality is seen involving upper pole of right kidney which most likely corresponds to cyst described on recent ultrasound. Urinary bladder is decompressed. Stomach/Bowel: Stomach is within normal limits. Appendix appears normal. No evidence of bowel wall thickening, distention, or inflammatory changes. Vascular/Lymphatic: Aortic atherosclerosis. No enlarged abdominal or pelvic lymph nodes. Reproductive: Uterus and bilateral adnexa are unremarkable. Other: No abdominal wall hernia or abnormality. No abdominopelvic ascites. Musculoskeletal: No acute or significant osseous findings. IMPRESSION: Mild left hydronephrosis is noted secondary to 7 mm calculus at the left ureteropelvic junction. Stable 2.5 cm left adrenal nodule is noted. Follow-up CT or MRI in 1 year is recommended to ensure stability. Aortic Atherosclerosis (ICD10-I70.0). Electronically Signed   By: Marijo Conception M.D.   On: 02/12/2021 16:40     Medications:    sodium chloride     cefTRIAXone (ROCEPHIN)  IV 2 g (02/12/21 2128)    amLODipine  5 mg Oral BID   vitamin C  500 mg Oral BID   atorvastatin  20 mg Oral QHS   azithromycin  500 mg Oral Daily   calcitRIOL  0.25 mcg Oral Daily   calcium carbonate  500 mg of elemental calcium Oral Daily   cloNIDine  0.2 mg Oral BID   folic acid  9,326 mcg Oral Daily   heparin injection (subcutaneous)  5,000 Units Subcutaneous Q8H   insulin aspart  0-15 Units Subcutaneous TID WC   insulin aspart  4 Units Subcutaneous TID WC   levothyroxine  112 mcg Oral QAC breakfast   melatonin  10 mg Oral QHS   metoprolol tartrate  75 mg Oral BID   nystatin   Topical BID   polyethylene glycol  17 g Oral Daily   potassium chloride SA  20 mEq Oral Daily   sodium chloride flush  3 mL Intravenous Q12H   Vitamin D (Ergocalciferol)  50,000 Units Oral Q Fri   sodium chloride, acetaminophen, albuterol, guaiFENesin-dextromethorphan, ondansetron (ZOFRAN) IV, sodium chloride  flush  Assessment/ Plan:  [name] is a 56 y.o.  female with a past medical history of anemia, hypertension, dyslipidemia, diabetes, CHF, and chronic kidney disease.  Patient presents to the hospital with complaints of shortness of breath and sore throat which started late last week.  Patient has been admitted for Viral URI [J06.9] Cardiorenal syndrome with renal failure [I13.10] Cardiorenal syndrome with renal failure, stage 1-4 or unspecified chronic kidney disease, with heart failure ([place]) [I13.0]   Acute Kidney Injury on chronic kidney disease stage IV .  Patient has AKI secondary to ATN Patient AKI could also have some contribution from obstruction as patient renal ultrasound did show mild left hydronephrosis with recommended CT imaging follow-up.   Patient has AKI on CKD stage IV Patient has CKD secondary to diabetes mellitus Patient has CKD going back to 2014 when patient had a creatinine of around 1.4 Patient's creatinine is now at around 2.4 as an outpatient No acute indication of  dialysis at this time.   Patient AKI had worsened with creatinine rising to 5.8.   thought process that patient could have been overdiuresis Patient diuretics were held  I did ask for CT scan without contrast Look for/rule out obstruction issues CT did show obstruction/ Hydronephrosis  Adrenals/Urinary Tract: Stable 2.5 cm left adrenal nodule is noted. Right adrenal gland appears normal. Mild left hydronephrosis is noted secondary to 7 mm calculus at left ureteropelvic junction. 1.9 cm hyperdense rounded abnormality is seen involving upper pole of right kidney which most likely corresponds to cyst described on recent ultrasound. Urinary bladder is decompressed.  Creat is minimally better  No acute need for renal replacement therapy today We will continue to follow closely  Lab Results  Component Value Date   CREATININE 5.65 (H) 02/13/2021   CREATININE 5.93 (H) 02/12/2021   CREATININE  5.87 (H) 02/11/2021    Intake/Output Summary (Last 24 hours) at 02/13/2021 0951 Last data filed at 02/13/2021 0915 Gross per 24 hour  Intake 1343.06 ml  Output 550 ml  Net 793.06 ml   2. Anemia of chronic kidney disease Lab Results  Component Value Date   HGB 10.0 (L) 02/12/2021    Hemoglobin at goal  3. Secondary Hyperparathyroidism:  Lab Results  Component Value Date   PTH 94 (H) 11/18/2017   CALCIUM 9.6 02/13/2021   PHOS 3.8 03/29/2018  Calcium and phosphorus within acceptable range. Continue home dosing of calcium carbonate, calcitriol, vitamin C and cholecalciferol.  4.  Hypertension with chronic kidney disease.  Blood pressure is at goal we will continue patient on  current antihypertensive regimen   5.Pneumonia Patient is on antibiotics Primary team is   Plan  Patient is 56 year old female with complicated medical history of acute kidney injury, CKD stage IV, anemia of chronic disease, secondary hyperparathyroidism, hypertension now with left-sided hydronephrosis.  Creatinine is better , will continue current tx I discussed with the patient about CT scan results Pt will need urology help if still inpt in am   I did educate patient about possible need for renal replacement therapy soon   LOS: 4 Audreana Hancox s Avyan Livesay 1/1/20239:51 AM

## 2021-02-14 ENCOUNTER — Encounter
Admission: EM | Disposition: A | Discharge status: Home / Self Care | Payer: Self-pay | Source: Home / Self Care | Attending: Internal Medicine

## 2021-02-14 ENCOUNTER — Inpatient Hospital Stay: Payer: BC Managed Care – PPO | Admitting: Anesthesiology

## 2021-02-14 DIAGNOSIS — I482 Chronic atrial fibrillation, unspecified: Secondary | ICD-10-CM | POA: Diagnosis not present

## 2021-02-14 DIAGNOSIS — I130 Hypertensive heart and chronic kidney disease with heart failure and stage 1 through stage 4 chronic kidney disease, or unspecified chronic kidney disease: Secondary | ICD-10-CM

## 2021-02-14 DIAGNOSIS — N201 Calculus of ureter: Secondary | ICD-10-CM

## 2021-02-14 DIAGNOSIS — N183 Chronic kidney disease, stage 3 unspecified: Secondary | ICD-10-CM

## 2021-02-14 DIAGNOSIS — N1330 Unspecified hydronephrosis: Secondary | ICD-10-CM

## 2021-02-14 HISTORY — PX: CYSTOSCOPY W/ URETERAL STENT PLACEMENT: SHX1429

## 2021-02-14 LAB — CBC
HCT: 36.1 % (ref 36.0–46.0)
Hemoglobin: 10.7 g/dL — ABNORMAL LOW (ref 12.0–15.0)
MCH: 27.6 pg (ref 26.0–34.0)
MCHC: 29.6 g/dL — ABNORMAL LOW (ref 30.0–36.0)
MCV: 93 fL (ref 80.0–100.0)
Platelets: 248 10*3/uL (ref 150–400)
RBC: 3.88 MIL/uL (ref 3.87–5.11)
RDW: 15.2 % (ref 11.5–15.5)
WBC: 12.2 10*3/uL — ABNORMAL HIGH (ref 4.0–10.5)
nRBC: 0 % (ref 0.0–0.2)

## 2021-02-14 LAB — GLUCOSE, CAPILLARY
Glucose-Capillary: 119 mg/dL — ABNORMAL HIGH (ref 70–99)
Glucose-Capillary: 169 mg/dL — ABNORMAL HIGH (ref 70–99)
Glucose-Capillary: 134 mg/dL — ABNORMAL HIGH (ref 70–99)
Glucose-Capillary: 138 mg/dL — ABNORMAL HIGH (ref 70–99)
Glucose-Capillary: 238 mg/dL — ABNORMAL HIGH (ref 70–99)

## 2021-02-14 LAB — COMPREHENSIVE METABOLIC PANEL
ALT: 24 U/L (ref 0–44)
AST: 18 U/L (ref 15–41)
Albumin: 3.4 g/dL — ABNORMAL LOW (ref 3.5–5.0)
Alkaline Phosphatase: 104 U/L (ref 38–126)
Anion gap: 14 (ref 5–15)
BUN: 118 mg/dL — ABNORMAL HIGH (ref 6–20)
CO2: 26 mmol/L (ref 22–32)
Calcium: 10 mg/dL (ref 8.9–10.3)
Chloride: 102 mmol/L (ref 98–111)
Creatinine, Ser: 5.71 mg/dL — ABNORMAL HIGH (ref 0.44–1.00)
GFR, Estimated: 8 mL/min — ABNORMAL LOW (ref >60)
Glucose, Bld: 130 mg/dL — ABNORMAL HIGH (ref 70–99)
Potassium: 5.2 mmol/L — ABNORMAL HIGH (ref 3.5–5.1)
Sodium: 142 mmol/L (ref 135–145)
Total Bilirubin: 0.7 mg/dL (ref 0.3–1.2)
Total Protein: 7.9 g/dL (ref 6.5–8.1)

## 2021-02-14 SURGERY — CYSTOSCOPY, WITH RETROGRADE PYELOGRAM AND URETERAL STENT INSERTION
Anesthesia: General | Laterality: Left

## 2021-02-14 MED ORDER — SUCCINYLCHOLINE CHLORIDE 200 MG/10ML IV SOSY
PREFILLED_SYRINGE | INTRAVENOUS | Status: DC | PRN
  Administered 2021-02-14: 140 mg via INTRAVENOUS

## 2021-02-14 MED ORDER — FENTANYL CITRATE (PF) 100 MCG/2ML IJ SOLN
INTRAMUSCULAR | Status: AC
  Filled 2021-02-14: qty 2

## 2021-02-14 MED ORDER — FENTANYL CITRATE (PF) 100 MCG/2ML IJ SOLN
INTRAMUSCULAR | Status: DC | PRN
Start: 2021-02-14 — End: 2021-02-14
  Administered 2021-02-14 (×2): 50 ug via INTRAVENOUS

## 2021-02-14 MED ORDER — LIDOCAINE HCL (PF) 2 % IJ SOLN
INTRAMUSCULAR | Status: AC
  Filled 2021-02-14: qty 5

## 2021-02-14 MED ORDER — SUCCINYLCHOLINE CHLORIDE 200 MG/10ML IV SOSY
PREFILLED_SYRINGE | INTRAVENOUS | Status: AC
  Filled 2021-02-14: qty 10

## 2021-02-14 MED ORDER — ACETAMINOPHEN 10 MG/ML IV SOLN: 1000.0000 mg | Freq: Once | INTRAVENOUS | Status: DC | PRN

## 2021-02-14 MED ORDER — ONDANSETRON HCL 4 MG/2ML IJ SOLN
INTRAMUSCULAR | Status: DC | PRN
  Administered 2021-02-14: 4 mg via INTRAVENOUS

## 2021-02-14 MED ORDER — DEXAMETHASONE SODIUM PHOSPHATE 10 MG/ML IJ SOLN
INTRAMUSCULAR | Status: AC
  Filled 2021-02-14: qty 1

## 2021-02-14 MED ORDER — PHENYLEPHRINE HCL (PRESSORS) 10 MG/ML IV SOLN
INTRAVENOUS | Status: DC | PRN
Start: 2021-02-14 — End: 2021-02-14
  Administered 2021-02-14: 160 ug via INTRAVENOUS

## 2021-02-14 MED ORDER — IOHEXOL 180 MG/ML  SOLN
INTRAMUSCULAR | Status: DC | PRN
  Administered 2021-02-14: 5 mL

## 2021-02-14 MED ORDER — ONDANSETRON HCL 4 MG/2ML IJ SOLN
INTRAMUSCULAR | Status: AC
  Filled 2021-02-14: qty 2

## 2021-02-14 MED ORDER — GLYCOPYRROLATE 0.2 MG/ML IJ SOLN
INTRAMUSCULAR | Status: DC | PRN
  Administered 2021-02-14: .2 mg via INTRAVENOUS

## 2021-02-14 MED ORDER — OXYCODONE HCL 5 MG PO TABS: 5.0000 mg | ORAL_TABLET | Freq: Once | ORAL | Status: DC | PRN

## 2021-02-14 MED ORDER — SODIUM CHLORIDE 0.9 % IR SOLN
Status: DC | PRN
  Administered 2021-02-14: 600 mL via INTRAVESICAL

## 2021-02-14 MED ORDER — PROPOFOL 10 MG/ML IV BOLUS
INTRAVENOUS | Status: AC
  Filled 2021-02-14: qty 40

## 2021-02-14 MED ORDER — ONDANSETRON HCL 4 MG/2ML IJ SOLN: 4.0000 mg | Freq: Once | INTRAMUSCULAR | Status: DC | PRN

## 2021-02-14 MED ORDER — PROPOFOL 10 MG/ML IV BOLUS
INTRAVENOUS | Status: DC | PRN
  Administered 2021-02-14: 150 mg via INTRAVENOUS

## 2021-02-14 MED ORDER — DEXAMETHASONE SODIUM PHOSPHATE 10 MG/ML IJ SOLN
INTRAMUSCULAR | Status: DC | PRN
  Administered 2021-02-14: 10 mg via INTRAVENOUS

## 2021-02-14 MED ORDER — GLYCOPYRROLATE 0.2 MG/ML IJ SOLN
INTRAMUSCULAR | Status: AC
  Filled 2021-02-14: qty 1

## 2021-02-14 MED ORDER — LIDOCAINE HCL (CARDIAC) PF 100 MG/5ML IV SOSY
PREFILLED_SYRINGE | INTRAVENOUS | Status: DC | PRN
  Administered 2021-02-14: 50 mg via INTRAVENOUS

## 2021-02-14 MED ORDER — OXYCODONE HCL 5 MG/5ML PO SOLN: 5.0000 mg | Freq: Once | ORAL | Status: DC | PRN

## 2021-02-14 MED ORDER — FENTANYL CITRATE (PF) 100 MCG/2ML IJ SOLN: 25.0000 ug | INTRAMUSCULAR | Status: DC | PRN

## 2021-02-14 SURGICAL SUPPLY — 17 items
BAG DRAIN CYSTO-URO LG1000N (MISCELLANEOUS) ×2 IMPLANT
BRUSH SCRUB EZ 1% IODOPHOR (MISCELLANEOUS) ×1 IMPLANT
CATH URETL OPEN 5X70 (CATHETERS) ×2 IMPLANT
GLOVE SURG UNDER POLY LF SZ7.5 (GLOVE) ×2 IMPLANT
GOWN STRL REUS W/ TWL LRG LVL3 (GOWN DISPOSABLE) ×1 IMPLANT
GOWN STRL REUS W/ TWL XL LVL3 (GOWN DISPOSABLE) ×1 IMPLANT
GOWN STRL REUS W/TWL LRG LVL3 (GOWN DISPOSABLE) ×2
GOWN STRL REUS W/TWL XL LVL3 (GOWN DISPOSABLE) ×2
GUIDEWIRE STR DUAL SENSOR (WIRE) ×2 IMPLANT
IV NS IRRIG 3000ML ARTHROMATIC (IV SOLUTION) ×2 IMPLANT
KIT TURNOVER CYSTO (KITS) ×2 IMPLANT
MANIFOLD NEPTUNE II (INSTRUMENTS) ×2 IMPLANT
PACK CYSTO AR (MISCELLANEOUS) ×2 IMPLANT
SET CYSTO W/LG BORE CLAMP LF (SET/KITS/TRAYS/PACK) ×2 IMPLANT
STENT URET 6FRX24 CONTOUR (STENTS) ×1 IMPLANT
SURGILUBE 2OZ TUBE FLIPTOP (MISCELLANEOUS) ×2 IMPLANT
WATER STERILE IRR 500ML POUR (IV SOLUTION) ×2 IMPLANT

## 2021-02-14 NOTE — Consult Note (Signed)
°  ° °Urology Consult  ° °I have been asked to see the patient by Dr. Chiu, for evaluation and management of renal failure with left proximal ureteral stone and hydronephrosis. ° °Chief Complaint: Shortness of breath, renal failure ° °HPI:  °Nicole Schmidt is a 55 y.o. year old female with a number of comorbidities including morbid obesity(BMI 61), CHF, CKD(baseline creatinine 1.8), diabetes, history of PE on chronic anticoagulation, undiagnosed pulmonary disease with home oxygen.  She was admitted on 02/09/2021 with sore throat and shortness of breath and was felt to have pneumonia and was admitted to the hospitalist.  She also had worsening renal function with creatinine of ~5.5(eGFR ~8) and ultimately was further evaluated with a renal ultrasound and CT.CT dated 02/12/2021 shows a 7 mm left proximal ureteral stone with mild upstream hydronephrosis.  Urinalysis on 02/09/2021 shows 11-20 RBCs, 0-5 WBCs, few bacteria, nitrite negative, no leukocytes.  Urology was consulted on 02/14/2021. ° °On further questioning, she does report some left-sided flank pain about 2 weeks ago that resolved spontaneously.  She also had some tea colored urine at that time that she felt could have been blood.  She denies any fevers or chills.  She denies prior history of kidney stones. ° ° °PMH: °Past Medical History:  °Diagnosis Date  ° (HFpEF) heart failure with preserved ejection fraction (HCC)   ° a. 10/2018 Echo: EF 60-65%, diast dysfxn, RVSP 50.7 mmHg, mildly dil LA.  ° Acquired hypothyroidism 02/09/2020  ° Anemia   ° Arthritis   ° Breast cancer (HCC) 2014  ° right breast cancer  ° Breast cancer of upper-inner quadrant of right female breast (HCC)   ° Right breast invasive CA and DCIS , 7 mm T1,N0,M0. Er/PR pos, her 2 negative.  Margins 1 mm.  ° CHF (congestive heart failure) (HCC)   ° CKD (chronic kidney disease), stage III (HCC)   ° Diabetes mellitus type 2 in obese (HCC) 02/09/2020  ° Diabetes mellitus without complication (HCC)    ° Dyslipidemia 02/09/2020  ° Essential hypertension   ° Gout   ° Hypothyroidism   ° Menopause   ° age 44  ° Morbid obesity (HCC)   ° Persistent atrial fibrillation (HCC)   ° a. Dx 10/2018 in setting of PNA. CHA2DS2VASc = 4-->Eliquis; b. 11/2018 s/p successful DCCV.  ° Personal history of radiation therapy   ° Pulmonary embolism (HCC) 10/2014  ° a. Chronic eliquis.  ° Thyroid goiter   ° ° °Surgical History: °Past Surgical History:  °Procedure Laterality Date  ° BREAST BIOPSY Right 2014  ° breast ca  ° BREAST EXCISIONAL BIOPSY Right 07/19/2012  ° breast ca  ° BREAST SURGERY Right 2014  ° with sentinel node bx subareaolar duct excision  ° CARDIOVERSION N/A 11/28/2018  ° Procedure: CARDIOVERSION;  Surgeon: Gollan, Timothy J, MD;  Location: [facility] ORS;  Service: Cardiovascular;  Laterality: N/A;  ° COLONOSCOPY WITH PROPOFOL N/A 01/30/2017  ° Procedure: COLONOSCOPY WITH PROPOFOL;  Surgeon: Sankar, Seeplaputhur G, MD;  Location: [facility] ENDOSCOPY;  Service: Endoscopy;  Laterality: N/A;  ° ° °Allergies: No Known Allergies ° °Family History: °Family History  °Problem Relation Age of Onset  ° Diabetes Father   ° Hypertension Father   ° Parkinson's disease Father   ° Hypertension Mother   ° Rheum arthritis Mother   ° Atrial fibrillation Mother   ° Heart failure Mother   ° Heart attack Mother   °     died in her mid-70's.  ° Colon   Colon cancer Cousin 63   Breast cancer Neg Hx     Social History:  reports that she has never smoked. She has never used smokeless tobacco. She reports that she does not drink alcohol and does not use drugs.  ROS: Negative aside from those stated in the HPI.  Physical Exam: BP (!) 159/75    Pulse 90    Temp 98 F (36.7 C)    Resp 20    Ht 5' 4" (1.626 m)    Wt (!) 161.2 kg    LMP 07/17/2012    SpO2 100%    BMI 60.99 kg/m    Constitutional: Morbidly obese, on oxygen Cardiovascular: Regular rate and rhythm Respiratory: Diminished at bases bilaterally GI: Abdomen is soft, nontender, nondistended,  no abdominal masses  Laboratory Data: Reviewed in epic, see HPI  Pertinent Imaging: I have personally reviewed the CT showing mild left-sided hydronephrosis secondary to a 7 mm left proximal ureteral stone, possible small left lower pole stone, no right-sided renal stones.  Assessment & Plan:   Extremely comorbid 56 year old old female with medical history notable for morbid obesity(BMI 61), CHF, CKD(baseline creatinine 1.8), diabetes, history of PE on chronic anticoagulation, undiagnosed pulmonary disease with home oxygen.  She is currently admitted for worsening shortness of breath and renal failure.  CT showed a 7 mm left proximal ureteral stone with upstream hydronephrosis, and renal function impaired with creatinine of 5(eGFR of 8) from a baseline creatinine of 1.8.  No clinical or laboratory evidence of infection or pyelonephritis.  In the setting of her persistent renal failure and left-sided hydronephrosis I recommended left ureteral stent placement today.  I recommended delayed management in 3 to 4 weeks after treatment of her pneumonia to minimize risk of complications with her extreme comorbidities.  We discussed the risks of bleeding, infection, ureteral/renal injury, inability to place stent requiring nephrostomy tube, stent related symptoms including urgency/frequency/dysuria/flank pain, need for follow-up delayed ureteroscopy for definitive management of her stone after pneumonia resolved.  I also updated her cousin Susy who is her primary point of contact regarding her findings and plan moving forward.  Recommendations: -OR today for left ureteral stent placement to improve renal function.  We will plan for follow-up ureteroscopy, laser lithotripsy, stent change as an outpatient in around 4 weeks after pneumonia has resolved  Billey Co, MD  Total time spent on the floor was 90 minutes, with greater than 50% spent in counseling and coordination of care with the patient regarding  renal failure, left proximal ureteral stone, and need for ureteral stent placement to improve renal function with need for follow-up delayed ureteroscopy after treatment of pneumonia.  [place] [address] [place], [place] [address] [phone]

## 2021-02-14 NOTE — Progress Notes (Signed)
PROGRESS NOTE    Nicole Schmidt  ZWC:[id number] DOB: Oct 22, 1965 DOA: 02/09/2021 PCP: Lenard Simmer, MD    Brief Narrative:  56 year old female presented to emergency department with complaint of sore throat/shortness of breath, increasing over past several days prior to presentation.  Was concerned about possible bronchitis.  Patient reports she usually sleeps in a recliner, has noticed breathing a bit worse but has not tried to lay flat.  Patient reports she has consumed a bit more salt recently and possibly more fluid than usual since she has been feeling ill, but she reports adherence to home medication regimen. Is non-smoker/never-smoker, previous occupational respiratory exposure and at some point was told she may have COPD but no formal diagnosis of this, she does see pulmonary for chronic respiratory failure requiring 2L O2 via [place] at home, and OSA.Marland Kitchen At presentation, pt was noted to Cr over 4.5 with concerns of cardiorenal syndrome. Pt was admitted for further workup  Assessment & Plan:   Principal Problem:   Cardiorenal syndrome, stage 1-4 or unspecified chronic kidney disease, with heart failure (HCC) Active Problems:   History of pulmonary embolus (PE)   Acute renal failure superimposed on stage 3 chronic kidney disease (HCC)   Iron deficiency anemia   Goals of care, counseling/discussion   OSA on CPAP   Atrial fibrillation, chronic (HCC)   Dyslipidemia   Diabetes mellitus type 2 in obese [facility])   Acquired hypothyroidism   Physical deconditioning   Chronic respiratory failure with hypoxia (HCC)   Bronchitis  Stage 4 chronic kidney disease, with heart failure (HCC) Recently given gentle diuresis, fluid restriction, strict I&O echocardiogram with EF 60-65% renal ultrasound performed  Held ACE/ARB, avoid other nephrotoxic medications/contrast if possible At time of presentation, held home Eliquis, transition to heparin while inpatient/pending renal improvement Cr remains  elevated at 5.65 Appreciate input by Nephrology, mild L hydronephrosis with 47mm stone noted.  Urology consulted and pt is now s/p stent placement Repeat bmet in AM   Chronic respiratory failure with hypoxia ([place]) no known COPD, consider outpatient follow-up with PFT if not done recently.  Continued on PRN bronchodilators as needed On 4LNC, cont to wean O2 as toleratd   Acquired hypothyroidism TSH 0.267 Will check free T4 in AM   Diabetes mellitus type 2 in obese (HCC) Holding home GLP-1, placed on sliding scale insulin Glycemic trends stable A1c 7.2   Dyslipidemia Continue statin   Atrial fibrillation, chronic (HCC) Stable, continue beta-blocker  Held Eliquis at presentation secondary to renal function   History of pulmonary embolus (PE) Low suspicion for recurrence, patient reports compliance with home anticoagulation Appeared stable at present Stable at present   Iron deficiency anemia Monitor CBC   OSA on CPAP CPAP ordered nightly  PNA -Presenting CXR reviewed. Findings of lower infiltrates -WBC is up to 11.9k this AM -Will start empiric azithro and rocephin for PNA -On 4LNC. Would wean O2 as tolerated   DVT prophylaxis: heparin Code Status: Full Family Communication: Pt in room, family not at bedside  Status is: Inpatient  Remains inpatient appropriate because: Severity of illness    Consultants:  Nephrology Urology  Procedures:  L stent placement 1/2  Antimicrobials: Anti-infectives (From admission, onward)    Start     Dose/Rate Route Frequency Ordered Stop   02/11/21 1800  cefTRIAXone (ROCEPHIN) 2 g in sodium chloride 0.9 % 100 mL IVPB        2 g 200 mL/hr over 30 Minutes Intravenous Every 24 hours  02/11/21 1710 02/16/21 1759   02/11/21 1800  azithromycin (ZITHROMAX) tablet 500 mg        500 mg Oral Daily 02/11/21 1710 02/16/21 0959       Subjective: Denies abd pain or sob  Objective: Vitals:   02/14/21 1504 02/14/21 1515 02/14/21  1534 02/14/21 1600  BP: (!) 171/90 (!) 152/92 135/75 (!) 151/41  Pulse: (!) 104 (!) 104    Resp: 16 (!) 21  14  Temp:   (!) 97.4 F (36.3 C) (!) 97.4 F (36.3 C)  TempSrc:      SpO2: 95% 95%    Weight:      Height:        Intake/Output Summary (Last 24 hours) at 02/14/2021 1808 Last data filed at 02/14/2021 1450 Gross per 24 hour  Intake 580 ml  Output --  Net 580 ml    Filed Weights   02/12/21 0139 02/13/21 0511 02/14/21 0500  Weight: (!) 160.1 kg (!) 162.2 kg (!) 161.2 kg    Examination: General exam: Awake, laying in bed, in nad Respiratory system: Normal respiratory effort, no wheezing Cardiovascular system: regular rate, s1, s2 Gastrointestinal system: Soft, nondistended, positive BS Central nervous system: CN2-12 grossly intact, strength intact Extremities: Perfused, no clubbing Skin: Normal skin turgor, no notable skin lesions seen Psychiatry: Mood normal // no visual hallucinations   Data Reviewed: I have personally reviewed following labs and imaging studies  CBC: Recent Labs  Lab 02/08/21 2239 02/10/21 0847 02/11/21 0712 02/12/21 0626 02/14/21 0635  WBC 13.4* 8.6 11.9* 15.7* 12.2*  HGB 11.0* 9.8* 10.2* 10.0* 10.7*  HCT 36.5 32.6* 34.2* 33.9* 36.1  MCV 93.4 92.1 94.0 92.9 93.0  PLT 234 210 241 229 737    Basic Metabolic Panel: Recent Labs  Lab 02/10/21 0847 02/11/21 0712 02/12/21 0626 02/13/21 0538 02/14/21 0635  NA 138 142 140 136 142  K 4.3 4.7 4.8 4.5 5.2*  CL 105 101 103 101 102  CO2 24 28 23 24 26   GLUCOSE 166* 146* 145* 148* 130*  BUN 93* 96* 108* 109* 118*  CREATININE 5.34* 5.87* 5.93* 5.65* 5.71*  CALCIUM 8.7* 9.3 9.8 9.6 10.0    GFR: Estimated Creatinine Clearance: 16.7 mL/min (A) (by C-G formula based on SCr of 5.71 mg/dL (H)). Liver Function Tests: Recent Labs  Lab 02/10/21 0847 02/11/21 0712 02/12/21 0626 02/13/21 0538 02/14/21 0635  AST 12* 14* 13* 15 18  ALT 14 16 16 18 24   ALKPHOS 89 109 97 99 104  BILITOT 0.6 0.7  0.6 0.6 0.7  PROT 7.1 7.7 7.6 7.4 7.9  ALBUMIN 3.2* 3.2* 3.4* 3.1* 3.4*    No results for input(s): LIPASE, AMYLASE in the last 168 hours. No results for input(s): AMMONIA in the last 168 hours. Coagulation Profile: No results for input(s): INR, PROTIME in the last 168 hours. Cardiac Enzymes: No results for input(s): CKTOTAL, CKMB, CKMBINDEX, TROPONINI in the last 168 hours. BNP (last 3 results) No results for input(s): PROBNP in the last 8760 hours. HbA1C: No results for input(s): HGBA1C in the last 72 hours.  CBG: Recent Labs  Lab 02/13/21 1955 02/14/21 0816 02/14/21 1216 02/14/21 1504 02/14/21 1659  GLUCAP 139* 119* 169* 134* 138*    Lipid Profile: No results for input(s): CHOL, HDL, LDLCALC, TRIG, CHOLHDL, LDLDIRECT in the last 72 hours. Thyroid Function Tests: No results for input(s): TSH, T4TOTAL, FREET4, T3FREE, THYROIDAB in the last 72 hours.  Anemia Panel: No results for input(s): VITAMINB12, FOLATE, FERRITIN, TIBC,  IRON, RETICCTPCT in the last 72 hours. Sepsis Labs: Recent Labs  Lab 02/09/21 2239  PROCALCITON <0.10     Recent Results (from the past 240 hour(s))  Resp Panel by RT-PCR (Flu A&B, Covid) Throat     Status: None   Collection Time: 02/08/21 10:21 PM   Specimen: Throat; Nasopharyngeal(NP) swabs in vial transport medium  Result Value Ref Range Status   SARS Coronavirus 2 by RT PCR NEGATIVE NEGATIVE Final    Comment: (NOTE) SARS-CoV-2 target nucleic acids are NOT DETECTED.  The SARS-CoV-2 RNA is generally detectable in upper respiratory specimens during the acute phase of infection. The lowest concentration of SARS-CoV-2 viral copies this assay can detect is 138 copies/mL. A negative result does not preclude SARS-Cov-2 infection and should not be used as the sole basis for treatment or other patient management decisions. A negative result may occur with  improper specimen collection/handling, submission of specimen other than nasopharyngeal  swab, presence of viral mutation(s) within the areas targeted by this assay, and inadequate number of viral copies(<138 copies/mL). A negative result must be combined with clinical observations, patient history, and epidemiological information. The expected result is Negative.  Fact Sheet for Patients:  EntrepreneurPulse.com.au  Fact Sheet for Healthcare Providers:  IncredibleEmployment.be  This test is no t yet approved or cleared by the [place] FDA and  has been authorized for detection and/or diagnosis of SARS-CoV-2 by FDA under an Emergency Use Authorization (EUA). This EUA will remain  in effect (meaning this test can be used) for the duration of the COVID-19 declaration under Section 564(b)(1) of the Act, 21 U.S.C.section 360bbb-3(b)(1), unless the authorization is terminated  or revoked sooner.       Influenza A by PCR NEGATIVE NEGATIVE Final   Influenza B by PCR NEGATIVE NEGATIVE Final    Comment: (NOTE) The Xpert Xpress SARS-CoV-2/FLU/RSV plus assay is intended as an aid in the diagnosis of influenza from Nasopharyngeal swab specimens and should not be used as a sole basis for treatment. Nasal washings and aspirates are unacceptable for Xpert Xpress SARS-CoV-2/FLU/RSV testing.  Fact Sheet for Patients: EntrepreneurPulse.com.au  Fact Sheet for Healthcare Providers: IncredibleEmployment.be  This test is not yet approved or cleared by the [place] FDA and has been authorized for detection and/or diagnosis of SARS-CoV-2 by FDA under an Emergency Use Authorization (EUA). This EUA will remain in effect (meaning this test can be used) for the duration of the COVID-19 declaration under Section 564(b)(1) of the Act, 21 U.S.C. section 360bbb-3(b)(1), unless the authorization is terminated or revoked.  Performed at [facility], [place], [place] [address]   Group A  Strep by PCR [facility] Only)     Status: None   Collection Time: 02/08/21 10:21 PM   Specimen: Throat; Sterile Swab  Result Value Ref Range Status   Group A Strep by PCR NOT DETECTED NOT DETECTED Final    Comment: Performed at [facility], [address] [place], [place] [id number]      Radiology Studies: DG OR UROLOGY CYSTO IMAGE [facility] ONLY)  Result Date: 02/14/2021 There is no interpretation for this exam.  This order is for images obtained during a surgical procedure.  Please See "Surgeries" Tab for more information regarding the procedure.    Scheduled Meds:  amLODipine  5 mg Oral BID   vitamin C  500 mg Oral BID   atorvastatin  20 mg Oral QHS   azithromycin  500 mg Oral Daily   calcitRIOL  0.25 mcg Oral Daily   calcium carbonate  500 mg of elemental calcium Oral Daily   cloNIDine  0.2 mg Oral BID   folic acid  7,017 mcg Oral Daily   heparin injection (subcutaneous)  5,000 Units Subcutaneous Q8H   insulin aspart  0-15 Units Subcutaneous TID WC   insulin aspart  4 Units Subcutaneous TID WC   levothyroxine  112 mcg Oral QAC breakfast   melatonin  10 mg Oral QHS   metoprolol tartrate  75 mg Oral BID   nystatin   Topical BID   polyethylene glycol  17 g Oral Daily   sodium chloride flush  3 mL Intravenous Q12H   Vitamin D (Ergocalciferol)  50,000 Units Oral Q Fri   Continuous Infusions:  sodium chloride     cefTRIAXone (ROCEPHIN)  IV 2 g (02/14/21 1754)     LOS: 5 days   Marylu Lund, MD [facility] Pager On Amion  If 7PM-7AM, please contact night-coverage 02/14/2021, 6:08 PM

## 2021-02-14 NOTE — Transfer of Care (Signed)
Immediate Anesthesia Transfer of Care Note  Patient: Nicole Schmidt  Procedure(s) Performed: CYSTOSCOPY WITH RETROGRADE PYELOGRAM/URETERAL STENT PLACEMENT (Left)  Patient Location: PACU  Anesthesia Type:General  Level of Consciousness: awake and drowsy  Airway & Oxygen Therapy: Patient Spontanous Breathing and Patient connected to face mask oxygen  Post-op Assessment: Report given to RN and Post -op Vital signs reviewed and stable  Post vital signs: Reviewed  Last Vitals:  Vitals Value Taken Time  BP 179/150 02/14/21 1500  Temp    Pulse 104 02/14/21 1503  Resp 25 02/14/21 1503  SpO2 91 % 02/14/21 1503  Vitals shown include unvalidated device data.  Last Pain:  Vitals:   02/14/21 0003  TempSrc: Oral  PainSc:       Patients Stated Pain Goal: 0 (68/25/74 9355)  Complications: No notable events documented.

## 2021-02-14 NOTE — Op Note (Signed)
Date of procedure: 02/14/21  Preoperative diagnosis:  Left hydronephrosis Left proximal ureteral stone Renal failure/acute kidney injury  Postoperative diagnosis:  Same  Procedure: Cystoscopy, left retrograde pyelogram with intraoperative interpretation, left ureteral stent placement  Surgeon: Nickolas Madrid, MD  Anesthesia: General  Complications: None  Intraoperative findings:  Small, cystic, benign-appearing lesion on the right side of the bladder, no obvious tumors or lesions Ureteral orifices orthotopic bilaterally Uncomplicated left ureteral stent placement with drainage of cloudy urine  EBL: None  Specimens: None  Drains: Left 6 French by 24 cm ureteral stent  Indication: Nicole Schmidt is a 56 y.o. patient with renal failure and left proximal ureteral stone with left-sided hydronephrosis who opted for stent placement.  In the setting of her pneumonia and numerous comorbidities, we opted for ureteral stent placement with delayed ureteroscopy for management of her proximal left ureteral stone.  After reviewing the management options for treatment, they elected to proceed with the above surgical procedure(s). We have discussed the potential benefits and risks of the procedure, side effects of the proposed treatment, the likelihood of the patient achieving the goals of the procedure, and any potential problems that might occur during the procedure or recuperation. Informed consent has been obtained.  Description of procedure:  The patient was taken to the operating room and general anesthesia was induced. SCDs were placed for DVT prophylaxis. The patient was placed in the dorsal lithotomy position, prepped and draped in the usual sterile fashion, and preoperative antibiotics(ceftriaxone on the floor) were administered. A preoperative time-out was performed.   A 21 French rigid cystoscope was used to intubate the urethra and thorough cystoscopy was performed.  There was a small  cystic benign-appearing lesion on the right side of the bladder, but no tumors or lesions.  The ureteral orifices were orthotopic bilaterally.  A ureteral access catheter was used to intubate the left ureteral orifice and a retrograde pyelogram was performed that showed mild to moderate hydronephrosis with a filling defect in the proximal ureter consistent with known stone.  A sensor wire then advanced easily up into the upper pole of the left kidney under fluoroscopic vision.  A 6 French by 24 cm ureteral stent was uneventfully placed with an excellent curl in the upper pole, as well as under direct vision the bladder.  There was brisk drainage of cloudy urine through the side ports of the stent.  The bladder was drained and this concluded our procedure.  Disposition: Stable to PACU  Plan: Continue antibiotics for pneumonia, and trend renal function We will plan for outpatient definitive left ureteroscopy/laser lithotripsy/stent change in 3 to 4 weeks when pneumonia resolved  Nickolas Madrid, MD

## 2021-02-14 NOTE — Anesthesia Postprocedure Evaluation (Signed)
Anesthesia Post Note  Patient: PRINCESS KARNES  Procedure(s) Performed: CYSTOSCOPY WITH RETROGRADE PYELOGRAM/URETERAL STENT PLACEMENT (Left)  Patient location during evaluation: PACU Anesthesia Type: General Level of consciousness: awake and alert Pain management: pain level controlled Vital Signs Assessment: post-procedure vital signs reviewed and stable Respiratory status: spontaneous breathing, nonlabored ventilation, respiratory function stable and patient connected to nasal cannula oxygen (off of BIPAP now) Cardiovascular status: blood pressure returned to baseline and stable Postop Assessment: no apparent nausea or vomiting Anesthetic complications: no   No notable events documented.   Last Vitals:  Vitals:   02/14/21 1534 02/14/21 1600  BP: 135/75 (!) 151/41  Pulse:    Resp:  14  Temp: (!) 36.3 C (!) 36.3 C  SpO2:      Last Pain:  Vitals:   02/14/21 1515  TempSrc:   PainSc: 0-No pain                 Arita Miss

## 2021-02-14 NOTE — Anesthesia Preprocedure Evaluation (Signed)
Anesthesia Evaluation  Patient identified by MRN, date of birth, ID band Patient awake    Reviewed: Allergy & Precautions, NPO status , Patient's Chart, lab work & pertinent test results  History of Anesthesia Complications Negative for: history of anesthetic complications  Airway Mallampati: III  TM Distance: >3 FB Neck ROM: Full    Dental  (+) Poor Dentition, Chipped, Missing   Pulmonary sleep apnea and Oxygen sleep apnea , COPD,  oxygen dependent, Patient abstained from smoking.Not current smoker,     + decreased breath sounds      Cardiovascular Exercise Tolerance: Poor METS: < 3 Mets hypertension, Pt. on medications +CHF  (-) CAD and (-) Past MI (-) dysrhythmias  Rhythm:Regular Rate:Normal - Systolic murmurs 1. Left ventricular ejection fraction, by estimation, is 60 to 65%. The  left ventricle has normal function. Left ventricular endocardial border  not optimally defined to evaluate regional wall motion. There is moderate  left ventricular hypertrophy. Left  ventricular diastolic function could not be evaluated.  2. Right ventricular systolic function is normal. The right ventricular  size is mildly enlarged.  3. Left atrial size was moderately dilated.  4. Right atrial size was moderately dilated.  5. The mitral valve is normal in structure. Trivial mitral valve  regurgitation.  6. The aortic valve has an indeterminant number of cusps. Aortic valve  regurgitation is not visualized. No aortic stenosis is present.  7. The inferior vena cava is normal in size with greater than 50%  respiratory variability, suggesting right atrial pressure of 3 mmHg.    Neuro/Psych negative neurological ROS  negative psych ROS   GI/Hepatic neg GERD  ,(+)     (-) substance abuse  ,   Endo/Other  diabetesHypothyroidism Morbid obesity  Renal/GU ARF and CRFRenal disease     Musculoskeletal   Abdominal (+) + obese,    Peds  Hematology   Anesthesia Other Findings Past Medical History: No date: (HFpEF) heart failure with preserved ejection fraction ([place])     Comment:  a. 10/2018 Echo: EF 60-65%, diast dysfxn, RVSP 50.7 mmHg,              mildly dil LA. 02/09/2020: Acquired hypothyroidism No date: Anemia No date: Arthritis 2014: Breast cancer ([place])     Comment:  right breast cancer No date: Breast cancer of upper-inner quadrant of right female breast  ([place])     Comment:  Right breast invasive CA and DCIS , 7 mm T1,N0,M0. Er/PR              pos, her 2 negative.  Margins 1 mm. No date: CHF (congestive heart failure) (HCC) No date: CKD (chronic kidney disease), stage III ([place]) 02/09/2020: Diabetes mellitus type 2 in obese ([place]) No date: Diabetes mellitus without complication ([place]) 54/27/0623: Dyslipidemia No date: Essential hypertension No date: Gout No date: Hypothyroidism No date: Menopause     Comment:  age 56 No date: Morbid obesity ([place]) No date: Persistent atrial fibrillation ([place])     Comment:  a. Dx 10/2018 in setting of PNA. CHA2DS2VASc =               4-->Eliquis; b. 11/2018 s/p successful DCCV. No date: Personal history of radiation therapy 10/2014: Pulmonary embolism (HCC)     Comment:  a. Chronic eliquis. No date: Thyroid goiter  Reproductive/Obstetrics  Anesthesia Physical Anesthesia Plan  ASA: 4 and emergent  Anesthesia Plan: General   Post-op Pain Management: Ofirmev IV (intra-op)   Induction: Intravenous and Rapid sequence  PONV Risk Score and Plan: 3 and Ondansetron, Dexamethasone and Treatment may vary due to age or medical condition  Airway Management Planned: Oral ETT and Video Laryngoscope Planned  Additional Equipment: None  Intra-op Plan:   Post-operative Plan: Extubation in OR  Informed Consent: I have reviewed the patients History and Physical, chart, labs and discussed the procedure including the  risks, benefits and alternatives for the proposed anesthesia with the patient or authorized representative who has indicated his/her understanding and acceptance.     Dental advisory given  Plan Discussed with: CRNA and Surgeon  Anesthesia Plan Comments: (Patient ate full meal at 10am, however Dr Diamantina Providence states this procedure cannot wait for full 8hr NPO time due to patient being in acute renal failure with continued worsening.  Discussed risks of anesthesia with patient, including PONV, sore throat, lip/dental/eye damage, aspiration. Rare risks discussed as well, such as cardiorespiratory and neurological sequelae, and allergic reactions. Patient informed about increased incidence of above perioperative risk due to high BMI. Patient understands. Discussed the role of CRNA in patient's perioperative care. Patient understands.  Plan for RSI)        Anesthesia Quick Evaluation

## 2021-02-14 NOTE — Progress Notes (Signed)
[facility]  ROUNDING NOTE   Subjective:   Nicole Schmidt is a 55 year old female with a past medical history of anemia, hypertension, dyslipidemia, diabetes, CHF, and chronic kidney disease.  Patient presents to the hospital with complaints of shortness of breath and sore throat which started late last week.  Patient has been admitted for Viral URI [J06.9] Cardiorenal syndrome with renal failure [I13.10] Cardiorenal syndrome with renal failure, stage 1-4 or unspecified chronic kidney disease, with heart failure ([place]) [I13.0]  Patient is known to our practice and is followed by Dr. Holley Raring.  Patient was last seen in our office in November 2022.    Overall doing fair.  Continues to have some lower extremity edema Requiring oxygen supplementation Scheduled for urology consultation later today   Objective:  Vital signs in last 24 hours:  Temp:  [97.7 F (36.5 C)-98.6 F (37 C)] 98.2 F (36.8 C) (01/02 1217) Pulse Rate:  [40-99] 88 (01/02 1217) Resp:  [17-20] 20 (01/02 1217) BP: (146-166)/(72-88) 146/81 (01/02 1217) SpO2:  [71 %-100 %] 94 % (01/02 1217) Weight:  [161.2 kg] 161.2 kg (01/02 0500)  Weight change: -0.998 kg Filed Weights   02/12/21 0139 02/13/21 0511 02/14/21 0500  Weight: (!) 160.1 kg (!) 162.2 kg (!) 161.2 kg    Intake/Output: I/O last 3 completed shifts: In: 723 [P.O.:720; I.V.:3] Out: 1350 [Urine:1350]   Intake/Output this shift:  Total I/O In: 100 [I.V.:100] Out: -   Physical Exam: General: NAD, laying in the bed  Head: Normocephalic, atraumatic. Moist oral mucosal membranes  Eyes: Anicteric  Lungs:  Mild Rhonchi and wheeze, normal effort, 4L O2, cough  Heart: Irregular rhythm  Abdomen:  Soft, nontender  Extremities:  + peripheral edema.  Neurologic: Nonfocal, moving all four extremities       Basic Metabolic Panel: Recent Labs  Lab 02/10/21 0847 02/11/21 0712 02/12/21 0626 02/13/21 0538 02/14/21 0635  NA 138 142 140 136 142   K 4.3 4.7 4.8 4.5 5.2*  CL 105 101 103 101 102  CO2 24 28 23 24 26   GLUCOSE 166* 146* 145* 148* 130*  BUN 93* 96* 108* 109* 118*  CREATININE 5.34* 5.87* 5.93* 5.65* 5.71*  CALCIUM 8.7* 9.3 9.8 9.6 10.0     Liver Function Tests: Recent Labs  Lab 02/10/21 0847 02/11/21 0712 02/12/21 0626 02/13/21 0538 02/14/21 0635  AST 12* 14* 13* 15 18  ALT 14 16 16 18 24   ALKPHOS 89 109 97 99 104  BILITOT 0.6 0.7 0.6 0.6 0.7  PROT 7.1 7.7 7.6 7.4 7.9  ALBUMIN 3.2* 3.2* 3.4* 3.1* 3.4*    No results for input(s): LIPASE, AMYLASE in the last 168 hours. No results for input(s): AMMONIA in the last 168 hours.  CBC: Recent Labs  Lab 02/08/21 2239 02/10/21 0847 02/11/21 0712 02/12/21 0626 02/14/21 0635  WBC 13.4* 8.6 11.9* 15.7* 12.2*  HGB 11.0* 9.8* 10.2* 10.0* 10.7*  HCT 36.5 32.6* 34.2* 33.9* 36.1  MCV 93.4 92.1 94.0 92.9 93.0  PLT 234 210 241 229 248     Cardiac Enzymes: No results for input(s): CKTOTAL, CKMB, CKMBINDEX, TROPONINI in the last 168 hours.  BNP: Invalid input(s): POCBNP  CBG: Recent Labs  Lab 02/13/21 1217 02/13/21 1655 02/13/21 1955 02/14/21 0816 02/14/21 1216  GLUCAP 206* 131* 139* 119* 169*     Microbiology: Results for orders placed or performed during the hospital encounter of 02/09/21  Resp Panel by RT-PCR (Flu A&B, Covid) Throat     Status: None  Collection Time: 02/08/21 10:21 PM   Specimen: Throat; Nasopharyngeal(NP) swabs in vial transport medium  Result Value Ref Range Status   SARS Coronavirus 2 by RT PCR NEGATIVE NEGATIVE Final    Comment: (NOTE) SARS-CoV-2 target nucleic acids are NOT DETECTED.  The SARS-CoV-2 RNA is generally detectable in upper respiratory specimens during the acute phase of infection. The lowest concentration of SARS-CoV-2 viral copies this assay can detect is 138 copies/mL. A negative result does not preclude SARS-Cov-2 infection and should not be used as the sole basis for treatment or other patient  management decisions. A negative result may occur with  improper specimen collection/handling, submission of specimen other than nasopharyngeal swab, presence of viral mutation(s) within the areas targeted by this assay, and inadequate number of viral copies(<138 copies/mL). A negative result must be combined with clinical observations, patient history, and epidemiological information. The expected result is Negative.  Fact Sheet for Patients:  EntrepreneurPulse.com.au  Fact Sheet for Healthcare Providers:  IncredibleEmployment.be  This test is no t yet approved or cleared by the [place] FDA and  has been authorized for detection and/or diagnosis of SARS-CoV-2 by FDA under an Emergency Use Authorization (EUA). This EUA will remain  in effect (meaning this test can be used) for the duration of the COVID-19 declaration under Section 564(b)(1) of the Act, 21 U.S.C.section 360bbb-3(b)(1), unless the authorization is terminated  or revoked sooner.       Influenza A by PCR NEGATIVE NEGATIVE Final   Influenza B by PCR NEGATIVE NEGATIVE Final    Comment: (NOTE) The Xpert Xpress SARS-CoV-2/FLU/RSV plus assay is intended as an aid in the diagnosis of influenza from Nasopharyngeal swab specimens and should not be used as a sole basis for treatment. Nasal washings and aspirates are unacceptable for Xpert Xpress SARS-CoV-2/FLU/RSV testing.  Fact Sheet for Patients: EntrepreneurPulse.com.au  Fact Sheet for Healthcare Providers: IncredibleEmployment.be  This test is not yet approved or cleared by the [place] FDA and has been authorized for detection and/or diagnosis of SARS-CoV-2 by FDA under an Emergency Use Authorization (EUA). This EUA will remain in effect (meaning this test can be used) for the duration of the COVID-19 declaration under Section 564(b)(1) of the Act, 21 U.S.C. section 360bbb-3(b)(1),  unless the authorization is terminated or revoked.  Performed at [facility], [place], [place] [address]   Group A Strep by PCR [facility] Only)     Status: None   Collection Time: 02/08/21 10:21 PM   Specimen: Throat; Sterile Swab  Result Value Ref Range Status   Group A Strep by PCR NOT DETECTED NOT DETECTED Final    Comment: Performed at [facility], [address] [place], [place] [address]    Coagulation Studies: No results for input(s): LABPROT, INR in the last 72 hours.  Urinalysis: No results for input(s): COLORURINE, LABSPEC, PHURINE, GLUCOSEU, HGBUR, BILIRUBINUR, KETONESUR, PROTEINUR, UROBILINOGEN, NITRITE, LEUKOCYTESUR in the last 72 hours.  Invalid input(s): APPERANCEUR     Imaging: DG OR UROLOGY CYSTO IMAGE ([place])  Result Date: 02/14/2021 There is no interpretation for this exam.  This order is for images obtained during a surgical procedure.  Please See "Surgeries" Tab for more information regarding the procedure.     Medications:    [MAR Hold] sodium chloride     acetaminophen     [MAR Hold] cefTRIAXone (ROCEPHIN)  IV Stopped (02/13/21 1814)    [MAR Hold] amLODipine  5 mg Oral BID   [MAR Hold] vitamin  C  500 mg Oral BID   [MAR Hold] atorvastatin  20 mg Oral QHS   [MAR Hold] azithromycin  500 mg Oral Daily   [MAR Hold] calcitRIOL  0.25 mcg Oral Daily   [MAR Hold] calcium carbonate  500 mg of elemental calcium Oral Daily   [MAR Hold] cloNIDine  0.2 mg Oral BID   [MAR Hold] folic acid  4,765 mcg Oral Daily   [MAR Hold] heparin injection (subcutaneous)  5,000 Units Subcutaneous Q8H   [MAR Hold] insulin aspart  0-15 Units Subcutaneous TID WC   [MAR Hold] insulin aspart  4 Units Subcutaneous TID WC   [MAR Hold] levothyroxine  112 mcg Oral QAC breakfast   [MAR Hold] melatonin  10 mg Oral QHS   [MAR Hold] metoprolol tartrate  75 mg Oral BID   [MAR Hold] nystatin   Topical BID   [MAR Hold] polyethylene glycol  17 g Oral  Daily   [MAR Hold] sodium chloride flush  3 mL Intravenous Q12H   [MAR Hold] Vitamin D (Ergocalciferol)  50,000 Units Oral Q Fri   [MAR Hold] sodium chloride, acetaminophen, [MAR Hold] acetaminophen, [MAR Hold] albuterol, fentaNYL (SUBLIMAZE) injection, [MAR Hold] guaiFENesin-dextromethorphan, iohexol, [MAR Hold] ondansetron (ZOFRAN) IV, ondansetron (ZOFRAN) IV, oxyCODONE **OR** oxyCODONE, [MAR Hold] sodium chloride flush, sodium chloride irrigation  Assessment/ Plan:  Nicole Schmidt is a 56 y.o.  female with medical problems of morbid obesity, anemia, hypertension, dyslipidemia, diabetes, CHF, and chronic kidney disease.  Patient presents to the hospital with complaints of shortness of breath and sore throat which started late last week.  Patient has been admitted for Viral URI [J06.9] Cardiorenal syndrome with renal failure [I13.10] Cardiorenal syndrome with renal failure, stage 1-4 or unspecified chronic kidney disease, with heart failure ([place]) [I13.0]   Acute Kidney Injury on chronic kidney disease stage IV .  Patient has AKI secondary to ATN and possibly from obstructive uropathy from the left ureteropelvic kidney stone.  Baseline creatinine of 2.15/GFR 26 from December 30, 2020 Urology consultation is pending No acute indication for dialysis at present creatinine does not improve after Urology intervention, we may have to reconsider  Lab Results  Component Value Date   CREATININE 5.71 (H) 02/14/2021   CREATININE 5.65 (H) 02/13/2021   CREATININE 5.93 (H) 02/12/2021    Intake/Output Summary (Last 24 hours) at 02/14/2021 1451 Last data filed at 02/14/2021 1450 Gross per 24 hour  Intake 580 ml  Output 800 ml  Net -220 ml    2. Anemia of chronic kidney disease Lab Results  Component Value Date   HGB 10.7 (L) 02/14/2021    Hemoglobin acceptable  3. Secondary Hyperparathyroidism:  Lab Results  Component Value Date   PTH 94 (H) 11/18/2017   CALCIUM 10.0 02/14/2021   PHOS  3.8 03/29/2018  Calcium and phosphorus within acceptable range. Continue to monitor.  4.  Left kidney stone with hydronephrosis Urology consultation today.   LOS: [place] 1/2/20232:51 PM

## 2021-02-14 NOTE — Anesthesia Procedure Notes (Addendum)
Procedure Name: Intubation Date/Time: 02/14/2021 2:26 PM Performed by: Arita Miss, MD Pre-anesthesia Checklist: Patient identified, Patient being monitored, Timeout performed, Emergency Drugs available and Suction available Patient Re-evaluated:Patient Re-evaluated prior to induction Oxygen Delivery Method: Circle system utilized Preoxygenation: Pre-oxygenation with 100% oxygen Induction Type: IV induction and Rapid sequence Laryngoscope Size: McGraph and 4 Grade View: Grade I Tube type: Oral Tube size: 6.5 mm Number of attempts: 1 Airway Equipment and Method: Stylet, Video-laryngoscopy and Patient positioned with wedge pillow Placement Confirmation: ETT inserted through vocal cords under direct vision, positive ETCO2 and breath sounds checked- equal and bilateral Secured at: 21 cm Tube secured with: Tape Dental Injury: Teeth and Oropharynx as per pre-operative assessment

## 2021-02-15 ENCOUNTER — Encounter: Payer: Self-pay | Admitting: Urology

## 2021-02-15 ENCOUNTER — Inpatient Hospital Stay: Payer: BC Managed Care – PPO

## 2021-02-15 DIAGNOSIS — E039 Hypothyroidism, unspecified: Secondary | ICD-10-CM | POA: Diagnosis not present

## 2021-02-15 DIAGNOSIS — I130 Hypertensive heart and chronic kidney disease with heart failure and stage 1 through stage 4 chronic kidney disease, or unspecified chronic kidney disease: Secondary | ICD-10-CM | POA: Diagnosis not present

## 2021-02-15 LAB — CBC
HCT: 33.8 % — ABNORMAL LOW (ref 36.0–46.0)
Hemoglobin: 10 g/dL — ABNORMAL LOW (ref 12.0–15.0)
MCH: 27.2 pg (ref 26.0–34.0)
MCHC: 29.6 g/dL — ABNORMAL LOW (ref 30.0–36.0)
MCV: 92.1 fL (ref 80.0–100.0)
Platelets: 265 10*3/uL (ref 150–400)
RBC: 3.67 MIL/uL — ABNORMAL LOW (ref 3.87–5.11)
RDW: 14.9 % (ref 11.5–15.5)
WBC: 11.4 10*3/uL — ABNORMAL HIGH (ref 4.0–10.5)
nRBC: 0.2 % (ref 0.0–0.2)

## 2021-02-15 LAB — APTT: aPTT: 27 seconds (ref 24–36)

## 2021-02-15 LAB — GLUCOSE, CAPILLARY
Glucose-Capillary: 152 mg/dL — ABNORMAL HIGH (ref 70–99)
Glucose-Capillary: 180 mg/dL — ABNORMAL HIGH (ref 70–99)
Glucose-Capillary: 252 mg/dL — ABNORMAL HIGH (ref 70–99)
Glucose-Capillary: 173 mg/dL — ABNORMAL HIGH (ref 70–99)
Glucose-Capillary: 203 mg/dL — ABNORMAL HIGH (ref 70–99)

## 2021-02-15 LAB — COMPREHENSIVE METABOLIC PANEL
ALT: 23 U/L (ref 0–44)
AST: 16 U/L (ref 15–41)
Albumin: 3.2 g/dL — ABNORMAL LOW (ref 3.5–5.0)
Alkaline Phosphatase: 103 U/L (ref 38–126)
Anion gap: 12 (ref 5–15)
BUN: 113 mg/dL — ABNORMAL HIGH (ref 6–20)
CO2: 25 mmol/L (ref 22–32)
Calcium: 9.7 mg/dL (ref 8.9–10.3)
Chloride: 104 mmol/L (ref 98–111)
Creatinine, Ser: 5.58 mg/dL — ABNORMAL HIGH (ref 0.44–1.00)
GFR, Estimated: 8 mL/min — ABNORMAL LOW (ref >60)
Glucose, Bld: 192 mg/dL — ABNORMAL HIGH (ref 70–99)
Potassium: 5.5 mmol/L — ABNORMAL HIGH (ref 3.5–5.1)
Sodium: 141 mmol/L (ref 135–145)
Total Bilirubin: 0.6 mg/dL (ref 0.3–1.2)
Total Protein: 7.6 g/dL (ref 6.5–8.1)

## 2021-02-15 LAB — PROTIME-INR
INR: 1.1 (ref 0.8–1.2)
Prothrombin Time: 14.6 seconds (ref 11.4–15.2)

## 2021-02-15 MED ORDER — HEPARIN (PORCINE) 25000 UT/250ML-% IV SOLN
1600.0000 [IU]/h | INTRAVENOUS | Status: DC
  Administered 2021-02-15: 1600 [IU]/h via INTRAVENOUS
  Filled 2021-02-15 (×2): qty 250

## 2021-02-15 MED ORDER — HEPARIN BOLUS VIA INFUSION
5800.0000 [IU] | Freq: Once | INTRAVENOUS | Status: AC
  Administered 2021-02-15: 5800 [IU] via INTRAVENOUS
  Filled 2021-02-15: qty 5800

## 2021-02-15 NOTE — Progress Notes (Addendum)
PROGRESS NOTE    Nicole Schmidt  NID:[id number] DOB: [birth year] DOA: 02/09/2021 PCP: Lenard Simmer, MD    Brief Narrative:  56 year old female presented to emergency department with complaint of sore throat/shortness of breath, increasing over past several days prior to presentation.  Was concerned about possible bronchitis.  Patient reports she usually sleeps in a recliner, has noticed breathing a bit worse but has not tried to lay flat.  Patient reports she has consumed a bit more salt recently and possibly more fluid than usual since she has been feeling ill, but she reports adherence to home medication regimen. Is non-smoker/never-smoker, previous occupational respiratory exposure and at some point was told she may have COPD but no formal diagnosis of this, she does see pulmonary for chronic respiratory failure requiring 2L O2 via  at home, and OSA.Marland Kitchen At presentation, pt was noted to Cr over 4.5 with concerns of cardiorenal syndrome. Pt was admitted for further workup  Assessment & Plan:   Principal Problem:   Cardiorenal syndrome, stage 1-4 or unspecified chronic kidney disease, with heart failure (HCC) Active Problems:   History of pulmonary embolus (PE)   Acute renal failure superimposed on stage 3 chronic kidney disease (HCC)   Iron deficiency anemia   Goals of care, counseling/discussion   OSA on CPAP   Atrial fibrillation, chronic (HCC)   Dyslipidemia   Diabetes mellitus type 2 in obese [facility])   Acquired hypothyroidism   Physical deconditioning   Chronic respiratory failure with hypoxia (HCC)   Bronchitis  Stage 4 chronic kidney disease, with heart failure (HCC) Recently given gentle diuresis, fluid restriction, strict I&O echocardiogram with EF 60-65% Held ACE/ARB, avoid other nephrotoxic medications/contrast if possible At time of presentation, held home Eliquis, held given renal function Cr remains elevated at 5.58 Appreciate input by Nephrology, mild L  hydronephrosis with 30mm stone noted.  Urology consulted and pt is now s/p stent placement Recheck bmet in AM   Chronic respiratory failure with hypoxia ([place]) no known COPD, consider outpatient follow-up with PFT if not done recently.  Continued on PRN bronchodilators as needed On 4LNC, cont to wean O2 as toleratd Cont abx for pna per below   Acquired hypothyroidism TSH 0.267 Free T4 of 0.87   Diabetes mellitus type 2 in obese (HCC) Holding home GLP-1, placed on sliding scale insulin Glycemic trends stable A1c 7.2   Dyslipidemia Continue statin   Atrial fibrillation, chronic (HCC) Stable, continue beta-blocker  Held Eliquis at presentation secondary to renal function   History of pulmonary embolus (PE) Chart reviewed. Pt is followed by Dr. Janese Banks for hx of breast ca and PE. Oncology has recommended indefinite anticoagulation given limited morbility as well as morbid obesity Discussed with Urology. OK for full anticoagulation at this time post-stent placement Have placed request for heparin gtt Stable at present   Iron deficiency anemia Monitor CBC   OSA on CPAP CPAP ordered nightly  PNA -Presenting CXR reviewed. Findings of lower infiltrates worrisome for PNA -WBC is down to 11.4, peaking at 15.7 -Continued on empiric azithro and rocephin for PNA -On 4LNC. Would wean O2 as tolerated   DVT prophylaxis: heparin gtt Code Status: Full Family Communication: Pt in room, family not at bedside  Status is: Inpatient  Remains inpatient appropriate because: Severity of illness    Consultants:  Nephrology Urology  Procedures:  L stent placement 1/2  Antimicrobials: Anti-infectives (From admission, onward)    Start     Dose/Rate Route Frequency Ordered Stop  02/11/21 1800  cefTRIAXone (ROCEPHIN) 2 g in sodium chloride 0.9 % 100 mL IVPB        2 g 200 mL/hr over 30 Minutes Intravenous Every 24 hours 02/11/21 1710 02/15/21 1809   02/11/21 1800  azithromycin  (ZITHROMAX) tablet 500 mg        500 mg Oral Daily 02/11/21 1710 02/15/21 0900       Subjective: Reports voiding very well. Denies flank pain. Does report some pink urine and some pain on urination  Objective: Vitals:   02/15/21 0427 02/15/21 0751 02/15/21 1201 02/15/21 1625  BP: (!) 153/86 (!) 156/87 117/80 (!) 151/78  Pulse: 94 82 85 90  Resp: 18     Temp: 98.2 F (36.8 C) 98 F (36.7 C) (!) 97.4 F (36.3 C) (!) 97.4 F (36.3 C)  TempSrc:  Oral Oral Oral  SpO2: 93% 95% 92% 95%  Weight:      Height:        Intake/Output Summary (Last 24 hours) at 02/15/2021 1840 Last data filed at 02/15/2021 1700 Gross per 24 hour  Intake 1440 ml  Output 1450 ml  Net -10 ml    Filed Weights   02/12/21 0139 02/13/21 0511 02/14/21 0500  Weight: (!) 160.1 kg (!) 162.2 kg (!) 161.2 kg    Examination: General exam: Conversant, in no acute distress Respiratory system: normal chest rise, clear, no audible wheezing Cardiovascular system: regular rhythm, s1-s2 Gastrointestinal system: Nondistended, nontender, pos BS Central nervous system: No seizures, no tremors Extremities: No cyanosis, no joint deformities Skin: No rashes, no pallor Psychiatry: Affect normal // no auditory hallucinations   Data Reviewed: I have personally reviewed following labs and imaging studies  CBC: Recent Labs  Lab 02/10/21 0847 02/11/21 0712 02/12/21 0626 02/14/21 0635 02/15/21 0627  WBC 8.6 11.9* 15.7* 12.2* 11.4*  HGB 9.8* 10.2* 10.0* 10.7* 10.0*  HCT 32.6* 34.2* 33.9* 36.1 33.8*  MCV 92.1 94.0 92.9 93.0 92.1  PLT 210 241 229 248 867    Basic Metabolic Panel: Recent Labs  Lab 02/11/21 0712 02/12/21 0626 02/13/21 0538 02/14/21 0635 02/15/21 0627  NA 142 140 136 142 141  K 4.7 4.8 4.5 5.2* 5.5*  CL 101 103 101 102 104  CO2 28 23 24 26 25   GLUCOSE 146* 145* 148* 130* 192*  BUN 96* 108* 109* 118* 113*  CREATININE 5.87* 5.93* 5.65* 5.71* 5.58*  CALCIUM 9.3 9.8 9.6 10.0 9.7     GFR: Estimated Creatinine Clearance: 17.1 mL/min (A) (by C-G formula based on SCr of 5.58 mg/dL (H)). Liver Function Tests: Recent Labs  Lab 02/11/21 0712 02/12/21 0626 02/13/21 0538 02/14/21 0635 02/15/21 0627  AST 14* 13* 15 18 16   ALT 16 16 18 24 23   ALKPHOS 109 97 99 104 103  BILITOT 0.7 0.6 0.6 0.7 0.6  PROT 7.7 7.6 7.4 7.9 7.6  ALBUMIN 3.2* 3.4* 3.1* 3.4* 3.2*    No results for input(s): LIPASE, AMYLASE in the last 168 hours. No results for input(s): AMMONIA in the last 168 hours. Coagulation Profile: No results for input(s): INR, PROTIME in the last 168 hours. Cardiac Enzymes: No results for input(s): CKTOTAL, CKMB, CKMBINDEX, TROPONINI in the last 168 hours. BNP (last 3 results) No results for input(s): PROBNP in the last 8760 hours. HbA1C: No results for input(s): HGBA1C in the last 72 hours.  CBG: Recent Labs  Lab 02/14/21 1659 02/14/21 2127 02/15/21 0753 02/15/21 1201 02/15/21 1625  GLUCAP 138* 238* 180* 252* 173*  Lipid Profile: No results for input(s): CHOL, HDL, LDLCALC, TRIG, CHOLHDL, LDLDIRECT in the last 72 hours. Thyroid Function Tests: No results for input(s): TSH, T4TOTAL, FREET4, T3FREE, THYROIDAB in the last 72 hours.  Anemia Panel: No results for input(s): VITAMINB12, FOLATE, FERRITIN, TIBC, IRON, RETICCTPCT in the last 72 hours. Sepsis Labs: Recent Labs  Lab 02/09/21 2239  PROCALCITON <0.10     Recent Results (from the past 240 hour(s))  Resp Panel by RT-PCR (Flu A&B, Covid) Throat     Status: None   Collection Time: 02/08/21 10:21 PM   Specimen: Throat; Nasopharyngeal(NP) swabs in vial transport medium  Result Value Ref Range Status   SARS Coronavirus 2 by RT PCR NEGATIVE NEGATIVE Final    Comment: (NOTE) SARS-CoV-2 target nucleic acids are NOT DETECTED.  The SARS-CoV-2 RNA is generally detectable in upper respiratory specimens during the acute phase of infection. The lowest concentration of SARS-CoV-2 viral copies  this assay can detect is 138 copies/mL. A negative result does not preclude SARS-Cov-2 infection and should not be used as the sole basis for treatment or other patient management decisions. A negative result may occur with  improper specimen collection/handling, submission of specimen other than nasopharyngeal swab, presence of viral mutation(s) within the areas targeted by this assay, and inadequate number of viral copies(<138 copies/mL). A negative result must be combined with clinical observations, patient history, and epidemiological information. The expected result is Negative.  Fact Sheet for Patients:  EntrepreneurPulse.com.au  Fact Sheet for Healthcare Providers:  IncredibleEmployment.be  This test is no t yet approved or cleared by the [place] FDA and  has been authorized for detection and/or diagnosis of SARS-CoV-2 by FDA under an Emergency Use Authorization (EUA). This EUA will remain  in effect (meaning this test can be used) for the duration of the COVID-19 declaration under Section 564(b)(1) of the Act, 21 U.S.C.section 360bbb-3(b)(1), unless the authorization is terminated  or revoked sooner.       Influenza A by PCR NEGATIVE NEGATIVE Final   Influenza B by PCR NEGATIVE NEGATIVE Final    Comment: (NOTE) The Xpert Xpress SARS-CoV-2/FLU/RSV plus assay is intended as an aid in the diagnosis of influenza from Nasopharyngeal swab specimens and should not be used as a sole basis for treatment. Nasal washings and aspirates are unacceptable for Xpert Xpress SARS-CoV-2/FLU/RSV testing.  Fact Sheet for Patients: EntrepreneurPulse.com.au  Fact Sheet for Healthcare Providers: IncredibleEmployment.be  This test is not yet approved or cleared by the [place] FDA and has been authorized for detection and/or diagnosis of SARS-CoV-2 by FDA under an Emergency Use Authorization (EUA). This EUA  will remain in effect (meaning this test can be used) for the duration of the COVID-19 declaration under Section 564(b)(1) of the Act, 21 U.S.C. section 360bbb-3(b)(1), unless the authorization is terminated or revoked.  Performed at [facility], [place], [place] [address]   Group A Strep by PCR [facility] Only)     Status: None   Collection Time: 02/08/21 10:21 PM   Specimen: Throat; Sterile Swab  Result Value Ref Range Status   Group A Strep by PCR NOT DETECTED NOT DETECTED Final    Comment: Performed at [facility], [address] [place], [place] [address]      Radiology Studies: DG OR UROLOGY CYSTO IMAGE [facility] ONLY)  Result Date: 02/14/2021 There is no interpretation for this exam.  This order is for images obtained during a surgical procedure.  Please See "Surgeries" Tab for  more information regarding the procedure.    Scheduled Meds:  amLODipine  5 mg Oral BID   vitamin C  500 mg Oral BID   atorvastatin  20 mg Oral QHS   calcitRIOL  0.25 mcg Oral Daily   calcium carbonate  500 mg of elemental calcium Oral Daily   cloNIDine  0.2 mg Oral BID   folic acid  2,122 mcg Oral Daily   heparin injection (subcutaneous)  5,000 Units Subcutaneous Q8H   insulin aspart  0-15 Units Subcutaneous TID WC   insulin aspart  4 Units Subcutaneous TID WC   levothyroxine  112 mcg Oral QAC breakfast   melatonin  10 mg Oral QHS   metoprolol tartrate  75 mg Oral BID   nystatin   Topical BID   polyethylene glycol  17 g Oral Daily   sodium chloride flush  3 mL Intravenous Q12H   Vitamin D (Ergocalciferol)  50,000 Units Oral Q Fri   Continuous Infusions:  sodium chloride Stopped (02/14/21 1830)     LOS: 6 days   Marylu Lund, MD [facility] Pager On Amion  If 7PM-7AM, please contact night-coverage 02/15/2021, 6:40 PM

## 2021-02-15 NOTE — Progress Notes (Signed)
[facility]  ROUNDING NOTE   Subjective:   Nicole Schmidt is a 56 year old female with a past medical history of anemia, hypertension, dyslipidemia, diabetes, CHF, and chronic kidney disease.  Patient presents to the hospital with complaints of shortness of breath and sore throat which started late last week.  Patient has been admitted for Viral URI [J06.9] Cardiorenal syndrome with renal failure [I13.10] Cardiorenal syndrome with renal failure, stage 1-4 or unspecified chronic kidney disease, with heart failure ([place]) [I13.0]  Patient is known to our practice and is followed by Dr. Holley Raring.  Patient was last seen in our office in November 2022.    Patient seen sitting in chair Alert and oriented Appetite remains poor but improving Edema improved  Creatinine 5.58 Overnight UOP 1L   Objective:  Vital signs in last 24 hours:  Temp:  [97.4 F (36.3 C)-98.2 F (36.8 C)] 97.4 F (36.3 C) (01/03 1201) Pulse Rate:  [82-104] 85 (01/03 1201) Resp:  [14-28] 18 (01/03 0427) BP: (117-179)/(41-150) 117/80 (01/03 1201) SpO2:  [90 %-95 %] 92 % (01/03 1201)  Weight change:  Filed Weights   02/12/21 0139 02/13/21 0511 02/14/21 0500  Weight: (!) 160.1 kg (!) 162.2 kg (!) 161.2 kg    Intake/Output: I/O last 3 completed shifts: In: 1300 [P.O.:1200; I.V.:100] Out: 1000 [Urine:1000]   Intake/Output this shift:  Total I/O In: 480 [P.O.:480] Out: -   Physical Exam: General: NAD, Sitting in bed  Head: Normocephalic, atraumatic. Moist oral mucosal membranes  Eyes: Anicteric  Lungs:  Mild rhonchi, normal effort, 4L O2, cough  Heart: Irregular rhythm  Abdomen:  Soft, nontender  Extremities:  + peripheral edema.  Neurologic: Nonfocal, moving all four extremities       Basic Metabolic Panel: Recent Labs  Lab 02/11/21 0712 02/12/21 0626 02/13/21 0538 02/14/21 0635 02/15/21 0627  NA 142 140 136 142 141  K 4.7 4.8 4.5 5.2* 5.5*  CL 101 103 101 102 104  CO2 28 23 24 26  25   GLUCOSE 146* 145* 148* 130* 192*  BUN 96* 108* 109* 118* 113*  CREATININE 5.87* 5.93* 5.65* 5.71* 5.58*  CALCIUM 9.3 9.8 9.6 10.0 9.7     Liver Function Tests: Recent Labs  Lab 02/11/21 0712 02/12/21 0626 02/13/21 0538 02/14/21 0635 02/15/21 0627  AST 14* 13* 15 18 16   ALT 16 16 18 24 23   ALKPHOS 109 97 99 104 103  BILITOT 0.7 0.6 0.6 0.7 0.6  PROT 7.7 7.6 7.4 7.9 7.6  ALBUMIN 3.2* 3.4* 3.1* 3.4* 3.2*    No results for input(s): LIPASE, AMYLASE in the last 168 hours. No results for input(s): AMMONIA in the last 168 hours.  CBC: Recent Labs  Lab 02/10/21 0847 02/11/21 0712 02/12/21 0626 02/14/21 0635 02/15/21 0627  WBC 8.6 11.9* 15.7* 12.2* 11.4*  HGB 9.8* 10.2* 10.0* 10.7* 10.0*  HCT 32.6* 34.2* 33.9* 36.1 33.8*  MCV 92.1 94.0 92.9 93.0 92.1  PLT 210 241 229 248 265     Cardiac Enzymes: No results for input(s): CKTOTAL, CKMB, CKMBINDEX, TROPONINI in the last 168 hours.  BNP: Invalid input(s): POCBNP  CBG: Recent Labs  Lab 02/14/21 1504 02/14/21 1659 02/14/21 2127 02/15/21 0753 02/15/21 1201  GLUCAP 134* 138* 238* 180* 252*     Microbiology: Results for orders placed or performed during the hospital encounter of 02/09/21  Resp Panel by RT-PCR (Flu A&B, Covid) Throat     Status: None   Collection Time: 02/08/21 10:21 PM   Specimen: Throat;  Nasopharyngeal(NP) swabs in vial transport medium  Result Value Ref Range Status   SARS Coronavirus 2 by RT PCR NEGATIVE NEGATIVE Final    Comment: (NOTE) SARS-CoV-2 target nucleic acids are NOT DETECTED.  The SARS-CoV-2 RNA is generally detectable in upper respiratory specimens during the acute phase of infection. The lowest concentration of SARS-CoV-2 viral copies this assay can detect is 138 copies/mL. A negative result does not preclude SARS-Cov-2 infection and should not be used as the sole basis for treatment or other patient management decisions. A negative result may occur with  improper  specimen collection/handling, submission of specimen other than nasopharyngeal swab, presence of viral mutation(s) within the areas targeted by this assay, and inadequate number of viral copies(<138 copies/mL). A negative result must be combined with clinical observations, patient history, and epidemiological information. The expected result is Negative.  Fact Sheet for Patients:  EntrepreneurPulse.com.au  Fact Sheet for Healthcare Providers:  IncredibleEmployment.be  This test is no t yet approved or cleared by the [place] FDA and  has been authorized for detection and/or diagnosis of SARS-CoV-2 by FDA under an Emergency Use Authorization (EUA). This EUA will remain  in effect (meaning this test can be used) for the duration of the COVID-19 declaration under Section 564(b)(1) of the Act, 21 U.S.C.section 360bbb-3(b)(1), unless the authorization is terminated  or revoked sooner.       Influenza A by PCR NEGATIVE NEGATIVE Final   Influenza B by PCR NEGATIVE NEGATIVE Final    Comment: (NOTE) The Xpert Xpress SARS-CoV-2/FLU/RSV plus assay is intended as an aid in the diagnosis of influenza from Nasopharyngeal swab specimens and should not be used as a sole basis for treatment. Nasal washings and aspirates are unacceptable for Xpert Xpress SARS-CoV-2/FLU/RSV testing.  Fact Sheet for Patients: EntrepreneurPulse.com.au  Fact Sheet for Healthcare Providers: IncredibleEmployment.be  This test is not yet approved or cleared by the [place] FDA and has been authorized for detection and/or diagnosis of SARS-CoV-2 by FDA under an Emergency Use Authorization (EUA). This EUA will remain in effect (meaning this test can be used) for the duration of the COVID-19 declaration under Section 564(b)(1) of the Act, 21 U.S.C. section 360bbb-3(b)(1), unless the authorization is terminated or revoked.  Performed at  [facility], [place], [place] [address]   Group A Strep by PCR [facility] Only)     Status: None   Collection Time: 02/08/21 10:21 PM   Specimen: Throat; Sterile Swab  Result Value Ref Range Status   Group A Strep by PCR NOT DETECTED NOT DETECTED Final    Comment: Performed at [facility], [address] [place], [place] [address]    Coagulation Studies: No results for input(s): LABPROT, INR in the last 72 hours.  Urinalysis: No results for input(s): COLORURINE, LABSPEC, PHURINE, GLUCOSEU, HGBUR, BILIRUBINUR, KETONESUR, PROTEINUR, UROBILINOGEN, NITRITE, LEUKOCYTESUR in the last 72 hours.  Invalid input(s): APPERANCEUR     Imaging: DG OR UROLOGY CYSTO IMAGE ([place])  Result Date: 02/14/2021 There is no interpretation for this exam.  This order is for images obtained during a surgical procedure.  Please See "Surgeries" Tab for more information regarding the procedure.     Medications:    sodium chloride Stopped (02/14/21 1830)   cefTRIAXone (ROCEPHIN)  IV Stopped (02/14/21 1830)    amLODipine  5 mg Oral BID   vitamin C  500 mg Oral BID   atorvastatin  20 mg Oral QHS   calcitRIOL  0.25 mcg Oral  Daily   calcium carbonate  500 mg of elemental calcium Oral Daily   cloNIDine  0.2 mg Oral BID   folic acid  2,952 mcg Oral Daily   heparin injection (subcutaneous)  5,000 Units Subcutaneous Q8H   insulin aspart  0-15 Units Subcutaneous TID WC   insulin aspart  4 Units Subcutaneous TID WC   levothyroxine  112 mcg Oral QAC breakfast   melatonin  10 mg Oral QHS   metoprolol tartrate  75 mg Oral BID   nystatin   Topical BID   polyethylene glycol  17 g Oral Daily   sodium chloride flush  3 mL Intravenous Q12H   Vitamin D (Ergocalciferol)  50,000 Units Oral Q Fri   sodium chloride, acetaminophen, albuterol, guaiFENesin-dextromethorphan, ondansetron (ZOFRAN) IV, sodium chloride flush  Assessment/ Plan:  Nicole Schmidt is a 56 y.o.  female with  medical problems of morbid obesity, anemia, hypertension, dyslipidemia, diabetes, CHF, and chronic kidney disease.  Patient presents to the hospital with complaints of shortness of breath and sore throat which started late last week.  Patient has been admitted for Viral URI [J06.9] Cardiorenal syndrome with renal failure [I13.10] Cardiorenal syndrome with renal failure, stage 1-4 or unspecified chronic kidney disease, with heart failure ([place]) [I13.0]   Acute Kidney Injury on chronic kidney disease stage IV .  Patient has AKI secondary to ATN and possibly from obstructive uropathy from the left ureteropelvic kidney stone.  Baseline creatinine of 2.15/GFR 26 from December 30, 2020 No acute indication for dialysis at present. Appreciate urology placement of stent yesterday. Will continue to monitor renal function for improvements.   Lab Results  Component Value Date   CREATININE 5.58 (H) 02/15/2021   CREATININE 5.71 (H) 02/14/2021   CREATININE 5.65 (H) 02/13/2021    Intake/Output Summary (Last 24 hours) at 02/15/2021 1459 Last data filed at 02/15/2021 1350 Gross per 24 hour  Intake 1440 ml  Output 1000 ml  Net 440 ml    2. Anemia of chronic kidney disease Lab Results  Component Value Date   HGB 10.0 (L) 02/15/2021    Hemoglobin at goal  3. Secondary Hyperparathyroidism:  Lab Results  Component Value Date   PTH 94 (H) 11/18/2017   CALCIUM 9.7 02/15/2021   PHOS 3.8 03/29/2018  Calcium and phosphorus within acceptable range. Continue to monitor.  4.  Left kidney stone with hydronephrosis Urology consultation. Left ureteral stent placed yesterday for left proximal stone.   LOS: 6 Nicole Schmidt 1/3/20232:59 PM

## 2021-02-15 NOTE — Progress Notes (Signed)
[place] for heparin Indication: pulmonary embolus  No Known Allergies  Patient Measurements: Height: 5\' 4"  (162.6 cm) Weight: (!) 161.2 kg (355 lb 4.8 oz) IBW/kg (Calculated) : 54.7 Heparin Dosing Weight: 96 kg  Vital Signs: Temp: 97.4 F (36.3 C) (01/03 1625) Temp Source: Oral (01/03 1625) BP: 151/78 (01/03 1625) Pulse Rate: 90 (01/03 1625)  Labs: Recent Labs    02/13/21 0538 02/14/21 0635 02/15/21 0627  HGB  --  10.7* 10.0*  HCT  --  36.1 33.8*  PLT  --  248 265  CREATININE 5.65* 5.71* 5.58*    Estimated Creatinine Clearance: 17.1 mL/min (A) (by C-G formula based on SCr of 5.58 mg/dL (H)).   Medical History: Past Medical History:  Diagnosis Date   (HFpEF) heart failure with preserved ejection fraction ([place])    a. 10/2018 Echo: EF 60-65%, diast dysfxn, RVSP 50.7 mmHg, mildly dil LA.   Acquired hypothyroidism 02/09/2020   Anemia    Arthritis    Breast cancer ([place]) 2014   right breast cancer   Breast cancer of upper-inner quadrant of right female breast ([place])    Right breast invasive CA and DCIS , 7 mm T1,N0,M0. Er/PR pos, her 2 negative.  Margins 1 mm.   CHF (congestive heart failure) (HCC)    CKD (chronic kidney disease), stage III (HCC)    Diabetes mellitus type 2 in obese ([place]) 02/09/2020   Diabetes mellitus without complication ([place])    Dyslipidemia 02/09/2020   Essential hypertension    Gout    Hypothyroidism    Menopause    age 55   Morbid obesity ([place])    Persistent atrial fibrillation ([place])    a. Dx 10/2018 in setting of PNA. CHA2DS2VASc = 4-->Eliquis; b. 11/2018 s/p successful DCCV.   Personal history of radiation therapy    Pulmonary embolism ([place]) 10/2014   a. Chronic eliquis.   Thyroid goiter     Medications:  Eliquis 5 mg BID (last dose PTA)  Assessment: 56 year old female with atrial fibrillation on Eliquis PTA. Patient also with h/o PE for which Oncology recommends indefinite anticoagulation.  Pharmacy consulted for heparin management.  Goal of Therapy:  Heparin level 0.3-0.7 units/ml aPTT 66-102 seconds Monitor platelets by anticoagulation protocol: Yes   Plan:  --Heparin 5800 unit bolus --Heparin infusion at 1600 units/hr --Check HL and aPTT 1/4 at 0400 (expect they will be correlating given last dose Eliquis 12/27), will plan to follow HL if correlating --CBC daily while on heparin infusion  Tawnya Crook, PharmD, BCPS Clinical Pharmacist 02/15/2021 7:14 PM

## 2021-02-16 DIAGNOSIS — I130 Hypertensive heart and chronic kidney disease with heart failure and stage 1 through stage 4 chronic kidney disease, or unspecified chronic kidney disease: Secondary | ICD-10-CM

## 2021-02-16 DIAGNOSIS — G4733 Obstructive sleep apnea (adult) (pediatric): Secondary | ICD-10-CM

## 2021-02-16 DIAGNOSIS — E039 Hypothyroidism, unspecified: Secondary | ICD-10-CM

## 2021-02-16 DIAGNOSIS — Z9989 Dependence on other enabling machines and devices: Secondary | ICD-10-CM

## 2021-02-16 DIAGNOSIS — D508 Other iron deficiency anemias: Secondary | ICD-10-CM

## 2021-02-16 DIAGNOSIS — I482 Chronic atrial fibrillation, unspecified: Secondary | ICD-10-CM

## 2021-02-16 DIAGNOSIS — J9611 Chronic respiratory failure with hypoxia: Secondary | ICD-10-CM

## 2021-02-16 DIAGNOSIS — E1169 Type 2 diabetes mellitus with other specified complication: Secondary | ICD-10-CM

## 2021-02-16 DIAGNOSIS — N170 Acute kidney failure with tubular necrosis: Secondary | ICD-10-CM

## 2021-02-16 DIAGNOSIS — R5381 Other malaise: Secondary | ICD-10-CM

## 2021-02-16 DIAGNOSIS — E669 Obesity, unspecified: Secondary | ICD-10-CM

## 2021-02-16 DIAGNOSIS — N183 Chronic kidney disease, stage 3 unspecified: Secondary | ICD-10-CM

## 2021-02-16 DIAGNOSIS — Z86711 Personal history of pulmonary embolism: Secondary | ICD-10-CM

## 2021-02-16 LAB — CBC
HCT: 31.6 % — ABNORMAL LOW (ref 36.0–46.0)
Hemoglobin: 9.5 g/dL — ABNORMAL LOW (ref 12.0–15.0)
MCH: 27.5 pg (ref 26.0–34.0)
MCHC: 30.1 g/dL (ref 30.0–36.0)
MCV: 91.3 fL (ref 80.0–100.0)
Platelets: 270 10*3/uL (ref 150–400)
RBC: 3.46 MIL/uL — ABNORMAL LOW (ref 3.87–5.11)
RDW: 15 % (ref 11.5–15.5)
WBC: 15.9 10*3/uL — ABNORMAL HIGH (ref 4.0–10.5)
nRBC: 0.2 % (ref 0.0–0.2)

## 2021-02-16 LAB — COMPREHENSIVE METABOLIC PANEL
ALT: 27 U/L (ref 0–44)
AST: 15 U/L (ref 15–41)
Albumin: 3.3 g/dL — ABNORMAL LOW (ref 3.5–5.0)
Alkaline Phosphatase: 96 U/L (ref 38–126)
Anion gap: 14 (ref 5–15)
BUN: 140 mg/dL — ABNORMAL HIGH (ref 6–20)
CO2: 24 mmol/L (ref 22–32)
Calcium: 9.7 mg/dL (ref 8.9–10.3)
Chloride: 99 mmol/L (ref 98–111)
Creatinine, Ser: 5.63 mg/dL — ABNORMAL HIGH (ref 0.44–1.00)
GFR, Estimated: 8 mL/min — ABNORMAL LOW (ref >60)
Glucose, Bld: 174 mg/dL — ABNORMAL HIGH (ref 70–99)
Potassium: 5.4 mmol/L — ABNORMAL HIGH (ref 3.5–5.1)
Sodium: 137 mmol/L (ref 135–145)
Total Bilirubin: 0.6 mg/dL (ref 0.3–1.2)
Total Protein: 7.6 g/dL (ref 6.5–8.1)

## 2021-02-16 LAB — GLUCOSE, CAPILLARY
Glucose-Capillary: 191 mg/dL — ABNORMAL HIGH (ref 70–99)
Glucose-Capillary: 200 mg/dL — ABNORMAL HIGH (ref 70–99)
Glucose-Capillary: 180 mg/dL — ABNORMAL HIGH (ref 70–99)
Glucose-Capillary: 167 mg/dL — ABNORMAL HIGH (ref 70–99)

## 2021-02-16 LAB — HEPARIN LEVEL (UNFRACTIONATED): Heparin Unfractionated: 0.6 IU/mL (ref 0.30–0.70)

## 2021-02-16 LAB — APTT: aPTT: 53 seconds — ABNORMAL HIGH (ref 24–36)

## 2021-02-16 MED ORDER — SODIUM ZIRCONIUM CYCLOSILICATE 10 G PO PACK
10.0000 g | PACK | Freq: Every day | ORAL | Status: DC
  Administered 2021-02-16 – 2021-02-17 (×2): 10 g via ORAL
  Filled 2021-02-16 (×3): qty 1

## 2021-02-16 MED ORDER — ENOXAPARIN SODIUM 80 MG/0.8ML IJ SOSY
1.0000 mg/kg | PREFILLED_SYRINGE | INTRAMUSCULAR | Status: DC
  Administered 2021-02-16 – 2021-02-17 (×2): 160 mg via SUBCUTANEOUS
  Filled 2021-02-16 (×2): qty 1.6

## 2021-02-16 MED ORDER — INSULIN GLARGINE-YFGN 100 UNIT/ML ~~LOC~~ SOLN
15.0000 [IU] | Freq: Every day | SUBCUTANEOUS | Status: DC
  Administered 2021-02-17 – 2021-02-22 (×5): 15 [IU] via SUBCUTANEOUS
  Filled 2021-02-16 (×7): qty 0.15

## 2021-02-16 NOTE — Evaluation (Signed)
Physical Therapy Evaluation Patient Details Name: Nicole Schmidt MRN: [id number] DOB: Dec 14, 1965 Today's Date: 02/16/2021  History of Present Illness  56 y.o. female with a PMH significant for HFpEF, hypothyroidism, anemia, arthritis, history of breast cancer, CKD 3, DM, HLD, HTN, gout, obesity, A. fib, history of PE.  She presented from home to the ED on 02/09/2021 with a sore throat and shortness of breath.   Clinical Impression  Pt received supine in bed, attempting to nap however agreeable to therapy. She lives alone in a single level home with a ramped entrance. She is retired and independent with all mobility, ADLs and IADLs. Does not use AD at baseline.   Pt performed bed mobility Mod I using bed features. She was SUP for remainder of mobility including STS, ambulatory toilet transfer and 102ft of ambulation in hallway. She managed O2 tank throughout mobility and did not rely on it for stability. Pt states she is near her baseline mobility with the exception of endurance. Pt was aware of her endurance limitations and turned around when she felt it was appropriate. No complaints of pain except during urination; no focal strength deficits. Pt will be seen by mobility specialist while at this facility and has been encouraged to ask nursing to walk in the hallway while in the hospital setting. Pt agreeable and motivated. PT is not recommending further skilled therapy. Please re-consult if status changes.      Recommendations for follow up therapy are one component of a multi-disciplinary discharge planning process, led by the attending physician.  Recommendations may be updated based on patient status, additional functional criteria and insurance authorization.  Follow Up Recommendations No PT follow up    Assistance Recommended at Discharge PRN  Patient can return home with the following  Other (comment) (assistance with obtaining groceries (decreased endurance))    Equipment Recommendations  None recommended by PT  Recommendations for Other Services       Functional Status Assessment Patient has had a recent decline in their functional status and demonstrates the ability to make significant improvements in function in a reasonable and predictable amount of time.     Precautions / Restrictions Precautions Precautions: None Restrictions Weight Bearing Restrictions: No      Mobility  Bed Mobility Overal bed mobility: Modified Independent             General bed mobility comments: using bed features    Transfers Overall transfer level: Needs assistance Equipment used: None Transfers: Sit to/from Stand Sit to Stand: Supervision           General transfer comment: from various surfaces. SUP for safety    Ambulation/Gait Ambulation/Gait assistance: Supervision Gait Distance (Feet): 60 Feet Assistive device: None Gait Pattern/deviations: Step-through pattern;Wide base of support Gait velocity: decreased     General Gait Details: Pt ambulated 18ft to restroom followed by 44ft in hallway while managing O2 tank. Limited by fatigue. SpO2 88% after toileting, recovered to 91% in standing with PLB, ranged between 87-91% during remainder of ambulation. Pt steady; prefers to ambulate near a wall for pt comfort. No UE support required.  Stairs            Wheelchair Mobility    Modified Rankin (Stroke Patients Only)       Balance Overall balance assessment: No apparent balance deficits (not formally assessed)             Standing balance comment: increased lateral weight shift during ambulation due to body habitus  Pertinent Vitals/Pain Pain Assessment: No/denies pain    Home Living Family/patient expects to be discharged to:: Private residence Living Arrangements: Alone Available Help at Discharge: Family;Friend(s);Available PRN/intermittently Type of Home: House Home Access: Ramped entrance        Home Layout: One level Home Equipment: Cane - single point;Rollator (4 wheels);Rolling Walker (2 wheels);BSC/3in1;Wheelchair - manual;Shower seat Additional Comments: Does not use AD at baseline.    Prior Function Prior Level of Function : Independent/Modified Independent                     Hand Dominance   Dominant Hand: Right    Extremity/Trunk Assessment   Upper Extremity Assessment Upper Extremity Assessment: Overall WFL for tasks assessed    Lower Extremity Assessment Lower Extremity Assessment: Overall WFL for tasks assessed;Generalized weakness (weak hip flexors bilaterally)       Communication   Communication: No difficulties  Cognition Arousal/Alertness: Awake/alert Behavior During Therapy: WFL for tasks assessed/performed Overall Cognitive Status: Within Functional Limits for tasks assessed                                 General Comments: Occasionally required additional time for word finding.        General Comments      Exercises     Assessment/Plan    PT Assessment Patient does not need any further PT services  PT Problem List Decreased activity tolerance;Cardiopulmonary status limiting activity       PT Treatment Interventions      PT Goals (Current goals can be found in the Care Plan section)  Acute Rehab PT Goals Patient Stated Goal: to go home when safe PT Goal Formulation: With patient Time For Goal Achievement: 03/02/21 Potential to Achieve Goals: Good    Frequency       Co-evaluation               AM-PAC PT "6 Clicks" Mobility  Outcome Measure Help needed turning from your back to your side while in a flat bed without using bedrails?: A Little Help needed moving from lying on your back to sitting on the side of a flat bed without using bedrails?: None Help needed moving to and from a bed to a chair (including a wheelchair)?: None Help needed standing up from a chair using your arms (e.g., wheelchair  or bedside chair)?: None Help needed to walk in hospital room?: None Help needed climbing 3-5 steps with a railing? : A Little 6 Click Score: 22    End of Session Equipment Utilized During Treatment: Oxygen Activity Tolerance: Patient tolerated treatment well;Patient limited by fatigue Patient left: in bed;with call bell/phone within reach Nurse Communication: Mobility status PT Visit Diagnosis: Muscle weakness (generalized) (M62.81);Difficulty in walking, not elsewhere classified (R26.2)    Time: 7253-6644 PT Time Calculation (min) (ACUTE ONLY): 26 min   Charges:   PT Evaluation $PT Eval Moderate Complexity: 1 Mod PT Treatments $Therapeutic Exercise: 8-22 mins        Patrina Levering PT, DPT 02/16/21 4:21 PM [phone]

## 2021-02-16 NOTE — Progress Notes (Signed)
PROGRESS NOTE  Nicole Schmidt    DOB: 1965-09-28, 56 y.o.  EXN:[id number]  PCP: Lenard Simmer, MD   Code Status: Full Code   DOA: 02/09/2021   LOS: 7  Brief Narrative of Current Hospitalization  Nicole Schmidt is a 56 y.o. female with a PMH significant for HFpEF, hypothyroidism, anemia, arthritis, history of breast cancer, CKD 3, DM, HLD, HTN, gout, obesity, A. fib, history of PE. They presented from home to the ED on 02/09/2021 with sore throat, shortness of breath x a couple days. In the ED, it was found that they had acute on chronic respiratory failure, acute renal failure.  They were seen by nephrology and urology for ARF and obstructive ureteral stone.  Patient received left ureteral stent on 1/2.   Patient was admitted to medicine service for further workup and management of ARF and PNA as outlined in detail below.  02/16/21 -stable  Assessment & Plan  Principal Problem:   Cardiorenal syndrome, stage 1-4 or unspecified chronic kidney disease, with heart failure (HCC) Active Problems:   History of pulmonary embolus (PE)   Acute renal failure superimposed on stage 3 chronic kidney disease (HCC)   Iron deficiency anemia   Goals of care, counseling/discussion   OSA on CPAP   Atrial fibrillation, chronic (HCC)   Dyslipidemia   Diabetes mellitus type 2 in obese [facility])   Acquired hypothyroidism   Physical deconditioning   Chronic respiratory failure with hypoxia (HCC)   Bronchitis  ARF on Stage 4 chronic kidney disease- Cr stable 5.6>>5.6. baseline around 2.1 Left hydronephrosis 2/2 ureteral stone- s/p ureteral stent placement 1/2. - nephrology following, appreciate recs - strict I/O   Chronic respiratory failure with hypoxia ([place])- 2L O2 requirement at baseline. Currently requiring 2-4L to maintain sats >90%.  CAP- completed antibiotic course - wean O2 as able - breathing treatments   Acquired hypothyroidism - continue levothyroxine   Diabetes mellitus type 2 in  obese (HCC)- hgb A1c 7.2 on admission.  - sSSI - starting daily semglee 15units   HFpEF   Afib   Dyslipidemia Continue statin, metoprolol Currently holding eliquis for kidney function/procedure and on enoxaparin    History of pulmonary embolus (PE) - restart eliquis when able   Iron deficiency anemia Monitor CBC   OSA on CPAP CPAP ordered nightly  DVT prophylaxis: on treatment enoxaparin   Diet:  Diet Orders (From admission, onward)     Start     Ordered   02/14/21 1758  Diet heart healthy/carb modified Room service appropriate? Yes; Fluid consistency: Thin  Diet effective now       Question Answer Comment  Diet-HS Snack? Nothing   Room service appropriate? Yes   Fluid consistency: Thin      02/14/21 1757            Subjective 02/16/21    Pt reports feeling fine today. Still some soreness at procedure insertion site. Otherwise no complaints.  Disposition Plan & Communication  Patient status: Inpatient  Admitted From: Home Disposition: Home Anticipated discharge date: TBD  Family Communication: none  Consults, Procedures, Significant Events  Consultants:  Nephrology Urology   Procedures/significant events:  Uretal stent placement 1/2  Antimicrobials:  Anti-infectives (From admission, onward)    Start     Dose/Rate Route Frequency Ordered Stop   02/11/21 1800  cefTRIAXone (ROCEPHIN) 2 g in sodium chloride 0.9 % 100 mL IVPB        2 g 200 mL/hr over 30 Minutes Intravenous  Every 24 hours 02/11/21 1710 02/15/21 1810   02/11/21 1800  azithromycin (ZITHROMAX) tablet 500 mg        500 mg Oral Daily 02/11/21 1710 02/15/21 0900       Objective   Vitals:   02/15/21 1625 02/15/21 1949 02/16/21 0004 02/16/21 0300  BP: (!) 151/78 (!) 160/89 (!) 158/71 (!) 156/80  Pulse: 90 94 78 91  Resp:  18 19 19   Temp: (!) 97.4 F (36.3 C) 98 F (36.7 C) (!) 97.5 F (36.4 C) 97.8 F (36.6 C)  TempSrc: Oral Oral Oral   SpO2: 95% 98% 93% 92%  Weight:      Height:         Intake/Output Summary (Last 24 hours) at 02/16/2021 0724 Last data filed at 02/16/2021 0532 Gross per 24 hour  Intake 892.37 ml  Output 900 ml  Net -7.63 ml   Filed Weights   02/12/21 0139 02/13/21 0511 02/14/21 0500  Weight: (!) 160.1 kg (!) 162.2 kg (!) 161.2 kg    Patient BMI: Body mass index is 60.99 kg/m.   Physical Exam:  General: awake, alert, NAD Respiratory: normal respiratory effort. Cardiovascular: normal S1/S2, RRR, no JVD, murmurs, quick capillary refill  Gastrointestinal: protuberant, soft, NT Nervous: A&O x3. no gross focal neurologic deficits, normal speech Skin: dry, intact, normal temperature, normal color. No rashes, lesions or ulcers on exposed skin Psychiatry: normal mood, congruent affect  Labs   I have personally reviewed following labs and imaging studies CBC    Component Value Date/Time   WBC 15.9 (H) 02/16/2021 0434   RBC 3.46 (L) 02/16/2021 0434   HGB 9.5 (L) 02/16/2021 0434   HGB 9.9 (L) 02/27/2020 0931   HCT 31.6 (L) 02/16/2021 0434   HCT 32.2 (L) 02/27/2020 0931   PLT 270 02/16/2021 0434   PLT 232 02/27/2020 0931   MCV 91.3 02/16/2021 0434   MCV 89 02/27/2020 0931   MCV 90 03/09/2014 1533   MCH 27.5 02/16/2021 0434   MCHC 30.1 02/16/2021 0434   RDW 15.0 02/16/2021 0434   RDW 13.0 02/27/2020 0931   RDW 15.2 (H) 03/09/2014 1533   LYMPHSABS 1.6 01/10/2021 1356   LYMPHSABS 1.5 03/09/2014 1533   MONOABS 0.9 01/10/2021 1356   MONOABS 0.6 03/09/2014 1533   EOSABS 0.3 01/10/2021 1356   EOSABS 0.2 03/09/2014 1533   BASOSABS 0.1 01/10/2021 1356   BASOSABS 0.1 03/09/2014 1533   BMP Latest Ref Rng & Units 02/16/2021 02/15/2021 02/14/2021  Glucose 70 - 99 mg/dL 174(H) 192(H) 130(H)  BUN 6 - 20 mg/dL 140(H) 113(H) 118(H)  Creatinine 0.44 - 1.00 mg/dL 5.63(H) 5.58(H) 5.71(H)  BUN/Creat Ratio 9 - 23 - - -  Sodium 135 - 145 mmol/L 137 141 142  Potassium 3.5 - 5.1 mmol/L 5.4(H) 5.5(H) 5.2(H)  Chloride 98 - 111 mmol/L 99 104 102  CO2 22 - 32 mmol/L  24 25 26   Calcium 8.9 - 10.3 mg/dL 9.7 9.7 10.0   Imaging Studies  DG OR UROLOGY CYSTO IMAGE ([facility] ONLY)  Result Date: 02/14/2021 There is no interpretation for this exam.  This order is for images obtained during a surgical procedure.  Please See "Surgeries" Tab for more information regarding the procedure.    Medications   Scheduled Meds:  amLODipine  5 mg Oral BID   vitamin C  500 mg Oral BID   atorvastatin  20 mg Oral QHS   calcitRIOL  0.25 mcg Oral Daily   calcium carbonate  500 mg  of elemental calcium Oral Daily   cloNIDine  0.2 mg Oral BID   folic acid  9,794 mcg Oral Daily   insulin aspart  0-15 Units Subcutaneous TID WC   insulin aspart  4 Units Subcutaneous TID WC   levothyroxine  112 mcg Oral QAC breakfast   melatonin  10 mg Oral QHS   metoprolol tartrate  75 mg Oral BID   nystatin   Topical BID   polyethylene glycol  17 g Oral Daily   sodium chloride flush  3 mL Intravenous Q12H   Vitamin D (Ergocalciferol)  50,000 Units Oral Q Fri   No recently discontinued medications to reconcile  LOS: 7 days   Richarda Osmond, DO [facility] 02/16/2021, 7:24 AM   Available by Epic secure chat 7AM-7PM. If 7PM-7AM, please contact night-coverage Refer to [email or web address] to contact the [facility] Attending or Consulting provider for this pt

## 2021-02-16 NOTE — Progress Notes (Signed)
[facility]  ROUNDING NOTE   Subjective:   Nicole Schmidt is a 56 year old female with a past medical history of anemia, hypertension, dyslipidemia, diabetes, CHF, and chronic kidney disease.  Patient presents to the hospital with complaints of shortness of breath and sore throat which started late last week.  Patient has been admitted for Viral URI [J06.9] Cardiorenal syndrome with renal failure [I13.10] Cardiorenal syndrome with renal failure, stage 1-4 or unspecified chronic kidney disease, with heart failure ([place]) [I13.0]  Patient is known to our practice and is followed by Dr. Holley Raring.  Patient was last seen in our office in November 2022.    Patient sitting up in chair Appetite continues to improve Denies nausea and vomiting  Creatinine 5.63 Urine output of 952ml recorded in last 24 hours   Objective:  Vital signs in last 24 hours:  Temp:  [97.1 F (36.2 C)-98 F (36.7 C)] 97.1 F (36.2 C) (01/04 0853) Pulse Rate:  [78-96] 96 (01/04 0853) Resp:  [16-19] 16 (01/04 0800) BP: (147-160)/(71-89) 147/82 (01/04 0853) SpO2:  [92 %-98 %] 93 % (01/04 0853)  Weight change:  Filed Weights   02/12/21 0139 02/13/21 0511 02/14/21 0500  Weight: (!) 160.1 kg (!) 162.2 kg (!) 161.2 kg    Intake/Output: I/O last 3 completed shifts: In: 1852.4 [P.O.:1680; I.V.:172.4] Out: 1900 [Urine:1900]   Intake/Output this shift:  Total I/O In: 240 [P.O.:240] Out: 400 [Urine:400]  Physical Exam: General: NAD, Sitting in chair  Head: Normocephalic, atraumatic. Moist oral mucosal membranes  Eyes: Anicteric  Lungs:  Mild rhonchi, normal effort, 4L O2, cough  Heart: Irregular rhythm  Abdomen:  Soft, nontender  Extremities:  + peripheral edema.  Neurologic: Nonfocal, moving all four extremities       Basic Metabolic Panel: Recent Labs  Lab 02/12/21 0626 02/13/21 0538 02/14/21 0635 02/15/21 0627 02/16/21 0434  NA 140 136 142 141 137  K 4.8 4.5 5.2* 5.5* 5.4*  CL 103  101 102 104 99  CO2 23 24 26 25 24   GLUCOSE 145* 148* 130* 192* 174*  BUN 108* 109* 118* 113* 140*  CREATININE 5.93* 5.65* 5.71* 5.58* 5.63*  CALCIUM 9.8 9.6 10.0 9.7 9.7     Liver Function Tests: Recent Labs  Lab 02/12/21 0626 02/13/21 0538 02/14/21 0635 02/15/21 0627 02/16/21 0434  AST 13* 15 18 16 15   ALT 16 18 24 23 27   ALKPHOS 97 99 104 103 96  BILITOT 0.6 0.6 0.7 0.6 0.6  PROT 7.6 7.4 7.9 7.6 7.6  ALBUMIN 3.4* 3.1* 3.4* 3.2* 3.3*    No results for input(s): LIPASE, AMYLASE in the last 168 hours. No results for input(s): AMMONIA in the last 168 hours.  CBC: Recent Labs  Lab 02/11/21 0712 02/12/21 0626 02/14/21 0635 02/15/21 0627 02/16/21 0434  WBC 11.9* 15.7* 12.2* 11.4* 15.9*  HGB 10.2* 10.0* 10.7* 10.0* 9.5*  HCT 34.2* 33.9* 36.1 33.8* 31.6*  MCV 94.0 92.9 93.0 92.1 91.3  PLT 241 229 248 265 270     Cardiac Enzymes: No results for input(s): CKTOTAL, CKMB, CKMBINDEX, TROPONINI in the last 168 hours.  BNP: Invalid input(s): POCBNP  CBG: Recent Labs  Lab 02/15/21 1201 02/15/21 1625 02/15/21 2122 02/16/21 0817 02/16/21 1228  GLUCAP 252* 173* 203* 191* 200*     Microbiology: Results for orders placed or performed during the hospital encounter of 02/09/21  Resp Panel by RT-PCR (Flu A&B, Covid) Throat     Status: None   Collection Time: 02/08/21 10:21 PM  Specimen: Throat; Nasopharyngeal(NP) swabs in vial transport medium  Result Value Ref Range Status   SARS Coronavirus 2 by RT PCR NEGATIVE NEGATIVE Final    Comment: (NOTE) SARS-CoV-2 target nucleic acids are NOT DETECTED.  The SARS-CoV-2 RNA is generally detectable in upper respiratory specimens during the acute phase of infection. The lowest concentration of SARS-CoV-2 viral copies this assay can detect is 138 copies/mL. A negative result does not preclude SARS-Cov-2 infection and should not be used as the sole basis for treatment or other patient management decisions. A negative result  may occur with  improper specimen collection/handling, submission of specimen other than nasopharyngeal swab, presence of viral mutation(s) within the areas targeted by this assay, and inadequate number of viral copies(<138 copies/mL). A negative result must be combined with clinical observations, patient history, and epidemiological information. The expected result is Negative.  Fact Sheet for Patients:  EntrepreneurPulse.com.au  Fact Sheet for Healthcare Providers:  IncredibleEmployment.be  This test is no t yet approved or cleared by the [place] FDA and  has been authorized for detection and/or diagnosis of SARS-CoV-2 by FDA under an Emergency Use Authorization (EUA). This EUA will remain  in effect (meaning this test can be used) for the duration of the COVID-19 declaration under Section 564(b)(1) of the Act, 21 U.S.C.section 360bbb-3(b)(1), unless the authorization is terminated  or revoked sooner.       Influenza A by PCR NEGATIVE NEGATIVE Final   Influenza B by PCR NEGATIVE NEGATIVE Final    Comment: (NOTE) The Xpert Xpress SARS-CoV-2/FLU/RSV plus assay is intended as an aid in the diagnosis of influenza from Nasopharyngeal swab specimens and should not be used as a sole basis for treatment. Nasal washings and aspirates are unacceptable for Xpert Xpress SARS-CoV-2/FLU/RSV testing.  Fact Sheet for Patients: EntrepreneurPulse.com.au  Fact Sheet for Healthcare Providers: IncredibleEmployment.be  This test is not yet approved or cleared by the [place] FDA and has been authorized for detection and/or diagnosis of SARS-CoV-2 by FDA under an Emergency Use Authorization (EUA). This EUA will remain in effect (meaning this test can be used) for the duration of the COVID-19 declaration under Section 564(b)(1) of the Act, 21 U.S.C. section 360bbb-3(b)(1), unless the authorization is terminated  or revoked.  Performed at [facility], [place], [place] [address]   Group A Strep by PCR [facility] Only)     Status: None   Collection Time: 02/08/21 10:21 PM   Specimen: Throat; Sterile Swab  Result Value Ref Range Status   Group A Strep by PCR NOT DETECTED NOT DETECTED Final    Comment: Performed at [facility], [address] [place], [place] [address]    Coagulation Studies: Recent Labs    02/15/21 1949  LABPROT 14.6  INR 1.1    Urinalysis: No results for input(s): COLORURINE, LABSPEC, PHURINE, GLUCOSEU, HGBUR, BILIRUBINUR, KETONESUR, PROTEINUR, UROBILINOGEN, NITRITE, LEUKOCYTESUR in the last 72 hours.  Invalid input(s): APPERANCEUR     Imaging: DG OR UROLOGY CYSTO IMAGE ([place])  Result Date: 02/14/2021 There is no interpretation for this exam.  This order is for images obtained during a surgical procedure.  Please See "Surgeries" Tab for more information regarding the procedure.     Medications:    sodium chloride Stopped (02/14/21 1830)    amLODipine  5 mg Oral BID   vitamin C  500 mg Oral BID   atorvastatin  20 mg Oral QHS   calcitRIOL  0.25 mcg Oral Daily  calcium carbonate  500 mg of elemental calcium Oral Daily   cloNIDine  0.2 mg Oral BID   enoxaparin (LOVENOX) injection  1 mg/kg Subcutaneous H08M   folic acid  5,784 mcg Oral Daily   insulin aspart  0-15 Units Subcutaneous TID WC   insulin aspart  4 Units Subcutaneous TID WC   [START ON 02/17/2021] insulin glargine-yfgn  15 Units Subcutaneous Daily   levothyroxine  112 mcg Oral QAC breakfast   melatonin  10 mg Oral QHS   metoprolol tartrate  75 mg Oral BID   nystatin   Topical BID   polyethylene glycol  17 g Oral Daily   sodium chloride flush  3 mL Intravenous Q12H   Vitamin D (Ergocalciferol)  50,000 Units Oral Q Fri   sodium chloride, acetaminophen, albuterol, guaiFENesin-dextromethorphan, ondansetron (ZOFRAN) IV, sodium chloride flush  Assessment/ Plan:   Nicole Schmidt is a 56 y.o.  female with medical problems of morbid obesity, anemia, hypertension, dyslipidemia, diabetes, CHF, and chronic kidney disease.  Patient presents to the hospital with complaints of shortness of breath and sore throat which started late last week.  Patient has been admitted for Viral URI [J06.9] Cardiorenal syndrome with renal failure [I13.10] Cardiorenal syndrome with renal failure, stage 1-4 or unspecified chronic kidney disease, with heart failure ([place]) [I13.0]   Acute Kidney Injury on chronic kidney disease stage IV .  Patient has AKI secondary to ATN and possibly from obstructive uropathy from the left ureteropelvic kidney stone.  Baseline creatinine of 2.15/GFR 26 from December 30, 2020 Appreciate urology placement of stent on 02/15/20. Creatinine continue to rise. Will recheck renal function this afternoon along with morning labs. If no improvement, may have to strongly consider renal replacement therapy.   Lab Results  Component Value Date   CREATININE 5.63 (H) 02/16/2021   CREATININE 5.58 (H) 02/15/2021   CREATININE 5.71 (H) 02/14/2021    Intake/Output Summary (Last 24 hours) at 02/16/2021 1356 Last data filed at 02/16/2021 1352 Gross per 24 hour  Intake 652.37 ml  Output 1300 ml  Net -647.63 ml    2. Anemia of chronic kidney disease Lab Results  Component Value Date   HGB 9.5 (L) 02/16/2021    Will continue to monitor. At goal at this time  3. Secondary Hyperparathyroidism:  Lab Results  Component Value Date   PTH 94 (H) 11/18/2017   CALCIUM 9.7 02/16/2021   PHOS 3.8 03/29/2018  Will continue to monitor bone minerals during this admission  4.  Left kidney stone with hydronephrosis Urology consultation. Left ureteral stent placed for left proximal stone.   LOS: [place] 1/4/20231:56 PM

## 2021-02-16 NOTE — Progress Notes (Signed)
[place] for LMWH Indication: hx of pulmonary embolus/afib  No Known Allergies  Patient Measurements: Height: 5\' 4"  (162.6 cm) Weight: (!) 161.2 kg (355 lb 4.8 oz) IBW/kg (Calculated) : 54.7  Vital Signs: Temp: 97.1 F (36.2 C) (01/04 0853) Temp Source: Oral (01/04 0004) BP: 147/82 (01/04 0853) Pulse Rate: 96 (01/04 0853)  Labs: Recent Labs    02/14/21 0635 02/15/21 0627 02/15/21 1949 02/16/21 0434  HGB 10.7* 10.0*  --  9.5*  HCT 36.1 33.8*  --  31.6*  PLT 248 265  --  270  APTT  --   --  27 53*  LABPROT  --   --  14.6  --   INR  --   --  1.1  --   HEPARINUNFRC  --   --   --  0.60  CREATININE 5.71* 5.58*  --  5.63*     Estimated Creatinine Clearance: 16.9 mL/min (A) (by C-G formula based on SCr of 5.63 mg/dL (H)).   Medical History: Past Medical History:  Diagnosis Date   (HFpEF) heart failure with preserved ejection fraction ([place])    a. 10/2018 Echo: EF 60-65%, diast dysfxn, RVSP 50.7 mmHg, mildly dil LA.   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 Er/PR pos, her 2 negative.  Margins 1 mm.   CHF (congestive heart failure) (HCC)    CKD (chronic kidney disease), stage III (HCC)    Diabetes mellitus type 2 in obese ([place]) 02/09/2020   Diabetes mellitus without complication ([place])    Dyslipidemia 02/09/2020   Essential hypertension    Gout    Hypothyroidism    Menopause    age 42   Morbid obesity ([place])    Persistent atrial fibrillation ([place])    a. Dx 10/2018 in setting of PNA. CHA2DS2VASc = 4-->Eliquis; b. 11/2018 s/p successful DCCV.   Personal history of radiation therapy    Pulmonary embolism ([place]) 10/2014   a. Chronic eliquis.   Thyroid goiter     Medications:  Eliquis 5 mg BID (last dose PTA)  Assessment: 56 year old  female with atrial fibrillation on Eliquis PTA. Patient also with h/o PE for which Oncology recommends indefinite anticoagulation. Pharmacy consulted for heparin management.  Goal of Therapy:  LMWH level 0.5-1 units/ml Monitor platelets by anticoagulation protocol: Yes   Plan:  Discontinue heparin and start Enoxaparin 1mg /kg Q24h (CrCl <30 ml/min) Obtain LMWH level 4 hours after 3rd or 4th dose CBC per protocol  Sherilyn Banker, PharmD, BCPS Clinical Pharmacist 02/16/2021 9:55 AM

## 2021-02-17 ENCOUNTER — Other Ambulatory Visit: Payer: Self-pay | Admitting: Urology

## 2021-02-17 DIAGNOSIS — E785 Hyperlipidemia, unspecified: Secondary | ICD-10-CM

## 2021-02-17 LAB — RENAL FUNCTION PANEL
Albumin: 3.2 g/dL — ABNORMAL LOW (ref 3.5–5.0)
Anion gap: 15 (ref 5–15)
BUN: 141 mg/dL — ABNORMAL HIGH (ref 6–20)
CO2: 25 mmol/L (ref 22–32)
Calcium: 10 mg/dL (ref 8.9–10.3)
Chloride: 102 mmol/L (ref 98–111)
Creatinine, Ser: 5.24 mg/dL — ABNORMAL HIGH (ref 0.44–1.00)
GFR, Estimated: 9 mL/min — ABNORMAL LOW (ref >60)
Glucose, Bld: 151 mg/dL — ABNORMAL HIGH (ref 70–99)
Phosphorus: 7.6 mg/dL — ABNORMAL HIGH (ref 2.5–4.6)
Potassium: 4.7 mmol/L (ref 3.5–5.1)
Sodium: 142 mmol/L (ref 135–145)

## 2021-02-17 LAB — CBC
HCT: 33.9 % — ABNORMAL LOW (ref 36.0–46.0)
Hemoglobin: 10.2 g/dL — ABNORMAL LOW (ref 12.0–15.0)
MCH: 27.5 pg (ref 26.0–34.0)
MCHC: 30.1 g/dL (ref 30.0–36.0)
MCV: 91.4 fL (ref 80.0–100.0)
Platelets: 275 10*3/uL (ref 150–400)
RBC: 3.71 MIL/uL — ABNORMAL LOW (ref 3.87–5.11)
RDW: 15.2 % (ref 11.5–15.5)
WBC: 14.7 10*3/uL — ABNORMAL HIGH (ref 4.0–10.5)
nRBC: 0.2 % (ref 0.0–0.2)

## 2021-02-17 LAB — GLUCOSE, CAPILLARY
Glucose-Capillary: 131 mg/dL — ABNORMAL HIGH (ref 70–99)
Glucose-Capillary: 266 mg/dL — ABNORMAL HIGH (ref 70–99)
Glucose-Capillary: 115 mg/dL — ABNORMAL HIGH (ref 70–99)

## 2021-02-17 NOTE — Progress Notes (Signed)
PROGRESS NOTE  Nicole Schmidt    DOB: [birth year], 56 y.o.  PIR:[id number]  PCP: Lenard Simmer, MD   Code Status: Full Code   DOA: 02/09/2021   LOS: 8  Brief Narrative of Current Hospitalization  Nicole Schmidt is a 56 y.o. female with a PMH significant for HFpEF, hypothyroidism, anemia, arthritis, history of breast cancer, CKD 3, DM, HLD, HTN, gout, obesity, A. fib, history of PE. They presented from home to the ED on 02/09/2021 with sore throat, shortness of breath x a couple days. In the ED, it was found that they had acute on chronic respiratory failure, acute renal failure.  They were seen by nephrology and urology for ARF and obstructive ureteral stone.  Patient received left ureteral stent on 1/2.   Patient was admitted to medicine service for further workup and management of ARF and PNA as outlined in detail below.  02/17/21 -stable  Assessment & Plan  Principal Problem:   Cardiorenal syndrome, stage 1-4 or unspecified chronic kidney disease, with heart failure (HCC) Active Problems:   History of pulmonary embolus (PE)   Acute renal failure superimposed on stage 3 chronic kidney disease (HCC)   Iron deficiency anemia   Goals of care, counseling/discussion   OSA on CPAP   Atrial fibrillation, chronic (HCC)   Dyslipidemia   Diabetes mellitus type 2 in obese [facility])   Acquired hypothyroidism   Physical deconditioning   Chronic respiratory failure with hypoxia (HCC)   Bronchitis  ARF on Stage 4 chronic kidney disease- Cr stable with mild improvement today 5.6>>5.6>5.24. baseline around 2.1 Left hydronephrosis 2/2 ureteral stone- s/p ureteral stent placement Jan 2nd. - nephrology following, appreciate recs  - no need for urgent dialysis currently - urology following, appreciate recs  - OP stent removal on follow up - strict I/O  Hyperkalemia- resolved. K+ 5.2>5.5>5.4>4.7 - RFP am   Chronic respiratory failure with hypoxia ([facility])- 2L O2 requirement at baseline.  Currently requiring 2-4L to maintain sats >90%.  CAP- completed antibiotic course - wean O2 as able - breathing treatments   Acquired hypothyroidism - continue levothyroxine   Diabetes mellitus type 2 in obese ([place])- hgb A1c 7.2 on admission.  - sSSI - continue daily semglee 15units   HFpEF   Afib   Dyslipidemia - Continue statin, metoprolol - Currently holding eliquis for kidney function/procedure and on enoxaparin    History of pulmonary embolus (PE) - restart eliquis when able   Iron deficiency anemia - Monitor CBC   OSA on CPAP - CPAP ordered nightly  DVT prophylaxis: on treatment enoxaparin   Diet:  Diet Orders (From admission, onward)     Start     Ordered   02/14/21 1758  Diet heart healthy/carb modified Room service appropriate? Yes; Fluid consistency: Thin  Diet effective now       Question Answer Comment  Diet-HS Snack? Nothing   Room service appropriate? Yes   Fluid consistency: Thin      02/14/21 1757            Subjective 02/17/21    Pt reports feeling well today. She has no complaints.   Disposition Plan & Communication  Patient status: Inpatient  Admitted From: Home Disposition: Home Anticipated discharge date: TBD  Family Communication: none  Consults, Procedures, Significant Events  Consultants:  Nephrology Urology   Procedures/significant events:  Uretal stent placement 1/2  Antimicrobials:  Anti-infectives (From admission, onward)    Start     Dose/Rate Route Frequency Ordered  Stop   02/11/21 1800  cefTRIAXone (ROCEPHIN) 2 g in sodium chloride 0.9 % 100 mL IVPB        2 g 200 mL/hr over 30 Minutes Intravenous Every 24 hours 02/11/21 1710 02/15/21 1810   02/11/21 1800  azithromycin (ZITHROMAX) tablet 500 mg        500 mg Oral Daily 02/11/21 1710 02/15/21 0900       Objective   Vitals:   02/16/21 1528 02/16/21 1934 02/17/21 0005 02/17/21 0359  BP: (!) 166/71 (!) 156/55 134/68 (!) 142/68  Pulse: 78 94 87 96  Resp:  18 18  18   Temp:  97.7 F (36.5 C) 97.7 F (36.5 C) 98.3 F (36.8 C)  TempSrc:  Oral Oral Oral  SpO2: 95% 95% 94% 94%  Weight:      Height:        Intake/Output Summary (Last 24 hours) at 02/17/2021 0707 Last data filed at 02/16/2021 1352 Gross per 24 hour  Intake 480 ml  Output 400 ml  Net 80 ml    Filed Weights   02/12/21 0139 02/13/21 0511 02/14/21 0500  Weight: (!) 160.1 kg (!) 162.2 kg (!) 161.2 kg    Patient BMI: Body mass index is 60.99 kg/m.   Physical Exam:  General: awake, alert, NAD Respiratory: normal respiratory effort. Cardiovascular: normal S1/S2, RRR, no JVD, murmurs, quick capillary refill  Gastrointestinal: protuberant, soft, NT Nervous: A&O x3. no gross focal neurologic deficits, normal speech Skin: dry, intact, normal temperature, normal color. No rashes, lesions or ulcers on exposed skin Psychiatry: normal mood, congruent affect  Labs   I have personally reviewed following labs and imaging studies CBC    Component Value Date/Time   WBC 15.9 (H) 02/16/2021 0434   RBC 3.46 (L) 02/16/2021 0434   HGB 9.5 (L) 02/16/2021 0434   HGB 9.9 (L) 02/27/2020 0931   HCT 31.6 (L) 02/16/2021 0434   HCT 32.2 (L) 02/27/2020 0931   PLT 270 02/16/2021 0434   PLT 232 02/27/2020 0931   MCV 91.3 02/16/2021 0434   MCV 89 02/27/2020 0931   MCV 90 03/09/2014 1533   MCH 27.5 02/16/2021 0434   MCHC 30.1 02/16/2021 0434   RDW 15.0 02/16/2021 0434   RDW 13.0 02/27/2020 0931   RDW 15.2 (H) 03/09/2014 1533   LYMPHSABS 1.6 01/10/2021 1356   LYMPHSABS 1.5 03/09/2014 1533   MONOABS 0.9 01/10/2021 1356   MONOABS 0.6 03/09/2014 1533   EOSABS 0.3 01/10/2021 1356   EOSABS 0.2 03/09/2014 1533   BASOSABS 0.1 01/10/2021 1356   BASOSABS 0.1 03/09/2014 1533   BMP Latest Ref Rng & Units 02/16/2021 02/15/2021 02/14/2021  Glucose 70 - 99 mg/dL 174(H) 192(H) 130(H)  BUN 6 - 20 mg/dL 140(H) 113(H) 118(H)  Creatinine 0.44 - 1.00 mg/dL 5.63(H) 5.58(H) 5.71(H)  BUN/Creat Ratio 9 - 23 - - -   Sodium 135 - 145 mmol/L 137 141 142  Potassium 3.5 - 5.1 mmol/L 5.4(H) 5.5(H) 5.2(H)  Chloride 98 - 111 mmol/L 99 104 102  CO2 22 - 32 mmol/L 24 25 26   Calcium 8.9 - 10.3 mg/dL 9.7 9.7 10.0   Imaging Studies  No results found.  Medications   Scheduled Meds:  amLODipine  5 mg Oral BID   vitamin C  500 mg Oral BID   atorvastatin  20 mg Oral QHS   calcitRIOL  0.25 mcg Oral Daily   calcium carbonate  500 mg of elemental calcium Oral Daily   cloNIDine  0.2 mg  Oral BID   enoxaparin (LOVENOX) injection  1 mg/kg Subcutaneous Z02H   folic acid  8,527 mcg Oral Daily   insulin aspart  0-15 Units Subcutaneous TID WC   insulin aspart  4 Units Subcutaneous TID WC   insulin glargine-yfgn  15 Units Subcutaneous Daily   levothyroxine  112 mcg Oral QAC breakfast   melatonin  10 mg Oral QHS   metoprolol tartrate  75 mg Oral BID   nystatin   Topical BID   polyethylene glycol  17 g Oral Daily   sodium chloride flush  3 mL Intravenous Q12H   sodium zirconium cyclosilicate  10 g Oral Daily   Vitamin D (Ergocalciferol)  50,000 Units Oral Q Fri   No recently discontinued medications to reconcile  LOS: 8 days   Richarda Osmond, DO [facility] 02/17/2021, 7:07 AM   Available by Epic secure chat 7AM-7PM. If 7PM-7AM, please contact night-coverage Refer to [email or web address] to contact the [facility] Attending or Consulting provider for this pt

## 2021-02-17 NOTE — Progress Notes (Unsigned)
Surgical Physician Order Form [facility] [place]  * Scheduling expectation :  3 to 6 weeks from 02/14/2021  *Length of Case: 1 hour  *Clearance needed: no  *Anticoagulation Instructions: May continue all anticoagulants  *Aspirin Instructions: Ok to continue all  *Post-op visit Date/Instructions: TBD  *Diagnosis: Left Ureteral Stone  *Procedure: left  Ureteroscopy w/laser lithotripsy & stent exchange ([id number])   Additional orders: N/A  -Admit type: OUTpatient  -Anesthesia: General  -VTE Prophylaxis Standing Order SCDs       Other:   -Standing Lab Orders Per Anesthesia    Lab other: UA&Urine Culture 2 weeks prior to surgery  -Standing Test orders EKG/Chest x-ray per Anesthesia       Test other:   - Medications:  Ancef 2gm IV  -Other orders:  N/A

## 2021-02-17 NOTE — Progress Notes (Signed)
[facility]  ROUNDING NOTE   Subjective:   Nicole Schmidt is a 56 year old female with a past medical history of anemia, hypertension, dyslipidemia, diabetes, CHF, and chronic kidney disease.  Patient presents to the hospital with complaints of shortness of breath and sore throat which started late last week.  Patient has been admitted for Viral URI [J06.9] Cardiorenal syndrome with renal failure [I13.10] Cardiorenal syndrome with renal failure, stage 1-4 or unspecified chronic kidney disease, with heart failure ([place]) [I13.0]  Patient is known to our practice and is followed by Dr. Holley Raring.  Patient was last seen in our office in November 2022.    Patient sitting in chair States she is able to ambulate in room independently Appetite adequate Denies shortness of breath  Creatinine improved to 5.2   Objective:  Vital signs in last 24 hours:  Temp:  [97.7 F (36.5 C)-98.4 F (36.9 C)] 98.4 F (36.9 C) (01/05 1141) Pulse Rate:  [78-96] 93 (01/05 1141) Resp:  [18] 18 (01/05 1141) BP: (134-166)/(55-84) 149/62 (01/05 1141) SpO2:  [94 %-97 %] 95 % (01/05 1141)  Weight change:  Filed Weights   02/12/21 0139 02/13/21 0511 02/14/21 0500  Weight: (!) 160.1 kg (!) 162.2 kg (!) 161.2 kg    Intake/Output: I/O last 3 completed shifts: In: 652.4 [P.O.:480; I.V.:172.4] Out: 850 [Urine:850]   Intake/Output this shift:  Total I/O In: 240 [P.O.:240] Out: 1600 [Urine:1600]  Physical Exam: General: NAD, Sitting in chair  Head: Normocephalic, atraumatic. Moist oral mucosal membranes  Eyes: Anicteric  Lungs:  Mild rhonchi, normal effort, 4L O2, cough  Heart: Irregular rhythm  Abdomen:  Soft, nontender  Extremities:  + peripheral edema.  Neurologic: Nonfocal, moving all four extremities       Basic Metabolic Panel: Recent Labs  Lab 02/13/21 0538 02/14/21 0635 02/15/21 0627 02/16/21 0434 02/17/21 0742  NA 136 142 141 137 142  K 4.5 5.2* 5.5* 5.4* 4.7  CL 101 102  104 99 102  CO2 24 26 25 24 25   GLUCOSE 148* 130* 192* 174* 151*  BUN 109* 118* 113* 140* 141*  CREATININE 5.65* 5.71* 5.58* 5.63* 5.24*  CALCIUM 9.6 10.0 9.7 9.7 10.0  PHOS  --   --   --   --  7.6*     Liver Function Tests: Recent Labs  Lab 02/12/21 0626 02/13/21 0538 02/14/21 0635 02/15/21 0627 02/16/21 0434 02/17/21 0742  AST 13* 15 18 16 15   --   ALT 16 18 24 23 27   --   ALKPHOS 97 99 104 103 96  --   BILITOT 0.6 0.6 0.7 0.6 0.6  --   PROT 7.6 7.4 7.9 7.6 7.6  --   ALBUMIN 3.4* 3.1* 3.4* 3.2* 3.3* 3.2*    No results for input(s): LIPASE, AMYLASE in the last 168 hours. No results for input(s): AMMONIA in the last 168 hours.  CBC: Recent Labs  Lab 02/12/21 0626 02/14/21 0635 02/15/21 0627 02/16/21 0434 02/17/21 0742  WBC 15.7* 12.2* 11.4* 15.9* 14.7*  HGB 10.0* 10.7* 10.0* 9.5* 10.2*  HCT 33.9* 36.1 33.8* 31.6* 33.9*  MCV 92.9 93.0 92.1 91.3 91.4  PLT 229 248 265 270 275     Cardiac Enzymes: No results for input(s): CKTOTAL, CKMB, CKMBINDEX, TROPONINI in the last 168 hours.  BNP: Invalid input(s): POCBNP  CBG: Recent Labs  Lab 02/16/21 1228 02/16/21 1529 02/16/21 1618 02/17/21 0923 02/17/21 1142  GLUCAP 200* 180* 167* 131* 266*     Microbiology: Results for  orders placed or performed during the hospital encounter of 02/09/21  Resp Panel by RT-PCR (Flu A&B, Covid) Throat     Status: None   Collection Time: 02/08/21 10:21 PM   Specimen: Throat; Nasopharyngeal(NP) swabs in vial transport medium  Result Value Ref Range Status   SARS Coronavirus 2 by RT PCR NEGATIVE NEGATIVE Final    Comment: (NOTE) SARS-CoV-2 target nucleic acids are NOT DETECTED.  The SARS-CoV-2 RNA is generally detectable in upper respiratory specimens during the acute phase of infection. The lowest concentration of SARS-CoV-2 viral copies this assay can detect is 138 copies/mL. A negative result does not preclude SARS-Cov-2 infection and should not be used as the sole  basis for treatment or other patient management decisions. A negative result may occur with  improper specimen collection/handling, submission of specimen other than nasopharyngeal swab, presence of viral mutation(s) within the areas targeted by this assay, and inadequate number of viral copies(<138 copies/mL). A negative result must be combined with clinical observations, patient history, and epidemiological information. The expected result is Negative.  Fact Sheet for Patients:  EntrepreneurPulse.com.au  Fact Sheet for Healthcare Providers:  IncredibleEmployment.be  This test is no t yet approved or cleared by the [place] FDA and  has been authorized for detection and/or diagnosis of SARS-CoV-2 by FDA under an Emergency Use Authorization (EUA). This EUA will remain  in effect (meaning this test can be used) for the duration of the COVID-19 declaration under Section 564(b)(1) of the Act, 21 U.S.C.section 360bbb-3(b)(1), unless the authorization is terminated  or revoked sooner.       Influenza A by PCR NEGATIVE NEGATIVE Final   Influenza B by PCR NEGATIVE NEGATIVE Final    Comment: (NOTE) The Xpert Xpress SARS-CoV-2/FLU/RSV plus assay is intended as an aid in the diagnosis of influenza from Nasopharyngeal swab specimens and should not be used as a sole basis for treatment. Nasal washings and aspirates are unacceptable for Xpert Xpress SARS-CoV-2/FLU/RSV testing.  Fact Sheet for Patients: EntrepreneurPulse.com.au  Fact Sheet for Healthcare Providers: IncredibleEmployment.be  This test is not yet approved or cleared by the [place] FDA and has been authorized for detection and/or diagnosis of SARS-CoV-2 by FDA under an Emergency Use Authorization (EUA). This EUA will remain in effect (meaning this test can be used) for the duration of the COVID-19 declaration under Section 564(b)(1) of the Act,  21 U.S.C. section 360bbb-3(b)(1), unless the authorization is terminated or revoked.  Performed at [facility], [place], [place] [address]   Group A Strep by PCR [facility] Only)     Status: None   Collection Time: 02/08/21 10:21 PM   Specimen: Throat; Sterile Swab  Result Value Ref Range Status   Group A Strep by PCR NOT DETECTED NOT DETECTED Final    Comment: Performed at [facility], [address] [place], [place] [address]    Coagulation Studies: Recent Labs    02/15/21 1949  LABPROT 14.6  INR 1.1     Urinalysis: No results for input(s): COLORURINE, LABSPEC, PHURINE, GLUCOSEU, HGBUR, BILIRUBINUR, KETONESUR, PROTEINUR, UROBILINOGEN, NITRITE, LEUKOCYTESUR in the last 72 hours.  Invalid input(s): APPERANCEUR     Imaging: No results found.   Medications:    sodium chloride Stopped (02/14/21 1830)    amLODipine  5 mg Oral BID   vitamin C  500 mg Oral BID   atorvastatin  20 mg Oral QHS   calcitRIOL  0.25 mcg Oral Daily   calcium carbonate  500  mg of elemental calcium Oral Daily   cloNIDine  0.2 mg Oral BID   enoxaparin (LOVENOX) injection  1 mg/kg Subcutaneous M27M   folic acid  7,867 mcg Oral Daily   insulin aspart  0-15 Units Subcutaneous TID WC   insulin aspart  4 Units Subcutaneous TID WC   insulin glargine-yfgn  15 Units Subcutaneous Daily   levothyroxine  112 mcg Oral QAC breakfast   melatonin  10 mg Oral QHS   metoprolol tartrate  75 mg Oral BID   nystatin   Topical BID   polyethylene glycol  17 g Oral Daily   sodium chloride flush  3 mL Intravenous Q12H   sodium zirconium cyclosilicate  10 g Oral Daily   Vitamin D (Ergocalciferol)  50,000 Units Oral Q Fri   sodium chloride, acetaminophen, albuterol, guaiFENesin-dextromethorphan, ondansetron (ZOFRAN) IV, sodium chloride flush  Assessment/ Plan:  [name] is a 56 y.o.  female with medical problems of morbid obesity, anemia, hypertension, dyslipidemia,  diabetes, CHF, and chronic kidney disease.  Patient presents to the hospital with complaints of shortness of breath and sore throat which started late last week.  Patient has been admitted for Viral URI [J06.9] Cardiorenal syndrome with renal failure [I13.10] Cardiorenal syndrome with renal failure, stage 1-4 or unspecified chronic kidney disease, with heart failure ([place]) [I13.0]   Acute Kidney Injury on chronic kidney disease stage IV .  Patient has AKI secondary to ATN and possibly from obstructive uropathy from the left ureteropelvic kidney stone.  Baseline creatinine of 2.15/GFR 26 from December 30, 2020 Appreciate urology placement of stent on 02/15/20.   -Creatinine improved   -Will continue to monitor   - No acute need for dialysis at this time but will continue to monitor.   Lab Results  Component Value Date   CREATININE 5.24 (H) 02/17/2021   CREATININE 5.63 (H) 02/16/2021   CREATININE 5.58 (H) 02/15/2021    Intake/Output Summary (Last 24 hours) at 02/17/2021 1213 Last data filed at 02/17/2021 0950 Gross per 24 hour  Intake 480 ml  Output 1800 ml  Net -1320 ml    2. Anemia of chronic kidney disease Lab Results  Component Value Date   HGB 10.2 (L) 02/17/2021    Will continue to monitor.   3. Secondary Hyperparathyroidism:  Lab Results  Component Value Date   PTH 94 (H) 11/18/2017   CALCIUM 10.0 02/17/2021   PHOS 7.6 (H) 02/17/2021  Calcium at goal. Phosphorus remains elevated. Will continue to monitor  4.  Left kidney stone with hydronephrosis Urology consultation. Left ureteral stent placed for left proximal stone.   LOS: [place] 1/5/202312:13 PM

## 2021-02-17 NOTE — Evaluation (Signed)
Occupational Therapy Evaluation Patient Details Name: Nicole Schmidt MRN: [id number] DOB: 1965-07-07 Today's Date: 02/17/2021   History of Present Illness 56 y.o. female with a PMH significant for HFpEF, hypothyroidism, anemia, arthritis, history of breast cancer, CKD 3, DM, HLD, HTN, gout, obesity, A. fib, history of PE.  She presented from home to the ED on 02/09/2021 with a sore throat and shortness of breath.   Clinical Impression   [name] was seen for OT evaluation this date. Pt was independent in all ADL and functional mobility, living in a 1 story home with ramped entrance. Pt on 2 liters of O2 at home. Pt reports becoming easily fatigued or out of breath with minimal exertion. Pt requires SETUP don B socks seated EOB - assist for picking up socks from floor. SUPERVISION for ADL t/f including managing O2 tank, cues for ECS. Seated SpO2 85% on 2L [place] (baseline O2) increased to 92% with PLB and rest. Required 4L [place] to maintain 88% during mobility, resolved with standing rest break. Pt educated in energy conservation strategies including pursed lip breathing, activity pacing, home/routines modifications, work simplification, AE/DME, and falls prevention. Pt verbalized understanding, all education complete, no skilled acute OT needs, will sign off. Upon discharge, recommend no follow up OT, pt may benefit from cardiopulmonary rehab.          Recommendations for follow up therapy are one component of a multi-disciplinary discharge planning process, led by the attending physician.  Recommendations may be updated based on patient status, additional functional criteria and insurance authorization.   Follow Up Recommendations  Other (comment) (cardiopulmonary rehab)    Assistance Recommended at Discharge Set up Supervision/Assistance  Patient can return home with the following Help with stairs or ramp for entrance;Assistance with cooking/housework    Functional Status Assessment  Patient has  had a recent decline in their functional status and demonstrates the ability to make significant improvements in function in a reasonable and predictable amount of time.  Equipment Recommendations  None recommended by OT    Recommendations for Other Services       Precautions / Restrictions Precautions Precautions: None Restrictions Weight Bearing Restrictions: No      Mobility Bed Mobility Overal bed mobility: Modified Independent             General bed mobility comments: bed rail    Transfers Overall transfer level: Modified independent                        Balance Overall balance assessment: No apparent balance deficits (not formally assessed)                                         ADL either performed or assessed with clinical judgement   ADL Overall ADL's : Needs assistance/impaired                                       General ADL Comments: SETUP don B socks seated EOB - assist for picking up socks from floor. SUPERVISION for ADL t/f including managing O2 tank, cues for ECS      Pertinent Vitals/Pain Pain Assessment: No/denies pain     Hand Dominance Right   Extremity/Trunk Assessment Upper Extremity Assessment Upper Extremity Assessment: Overall WFL for tasks assessed  Lower Extremity Assessment Lower Extremity Assessment: Overall WFL for tasks assessed       Communication Communication Communication: No difficulties   Cognition Arousal/Alertness: Awake/alert Behavior During Therapy: WFL for tasks assessed/performed Overall Cognitive Status: Within Functional Limits for tasks assessed                                       General Comments  Seated SpO2 85% on 2L [place] (baseline O2) increased to 92% with PLB and rest. Required 4L [place] to maintain 88% during mobility, resolved with standing rest break    Exercises     Shoulder Instructions      Home Living Family/patient expects  to be discharged to:: Private residence Living Arrangements: Alone Available Help at Discharge: Family;Friend(s);Available PRN/intermittently Type of Home: House Home Access: Ramped entrance     Home Layout: One level     Bathroom Shower/Tub: Occupational psychologist: Handicapped height Bathroom Accessibility: Yes   Home Equipment: [place] - single point;Rollator (4 wheels);Rolling Walker (2 wheels);BSC/3in1;Wheelchair - manual;Shower seat   Additional Comments: Does not use AD at baseline.      Prior Functioning/Environment Prior Level of Function : Independent/Modified Independent                        OT Problem List: Decreased activity tolerance;Cardiopulmonary status limiting activity         OT Goals(Current goals can be found in the care plan section) Acute Rehab OT Goals Patient Stated Goal: to improve breathing OT Goal Formulation: With patient Time For Goal Achievement: 03/03/21 Potential to Achieve Goals: Good   AM-PAC OT "6 Clicks" Daily Activity     Outcome Measure Help from another person eating meals?: None Help from another person taking care of personal grooming?: None Help from another person toileting, which includes using toliet, bedpan, or urinal?: None Help from another person bathing (including washing, rinsing, drying)?: A Little Help from another person to put on and taking off regular upper body clothing?: None Help from another person to put on and taking off regular lower body clothing?: A Little 6 Click Score: 22   End of Session Equipment Utilized During Treatment: Oxygen Nurse Communication: Mobility status  Activity Tolerance: Patient tolerated treatment well Patient left: in chair;with call bell/phone within reach  OT Visit Diagnosis: Unsteadiness on feet (R26.81)                Time: 1610-9604 OT Time Calculation (min): 26 min Charges:  OT General Charges $OT Visit: 1 Visit OT Evaluation $OT Eval Low Complexity:  1 Low OT Treatments $Self Care/Home Management : 8-22 mins  Dessie Coma, M.S. OTR/L  02/17/21, 10:32 AM  ascom [phone]

## 2021-02-18 LAB — RENAL FUNCTION PANEL
Albumin: 3.2 g/dL — ABNORMAL LOW (ref 3.5–5.0)
Anion gap: 12 (ref 5–15)
BUN: 138 mg/dL — ABNORMAL HIGH (ref 6–20)
CO2: 24 mmol/L (ref 22–32)
Calcium: 9.9 mg/dL (ref 8.9–10.3)
Chloride: 104 mmol/L (ref 98–111)
Creatinine, Ser: 4.99 mg/dL — ABNORMAL HIGH (ref 0.44–1.00)
GFR, Estimated: 10 mL/min — ABNORMAL LOW (ref >60)
Glucose, Bld: 139 mg/dL — ABNORMAL HIGH (ref 70–99)
Phosphorus: 8.3 mg/dL — ABNORMAL HIGH (ref 2.5–4.6)
Potassium: 5 mmol/L (ref 3.5–5.1)
Sodium: 140 mmol/L (ref 135–145)

## 2021-02-18 LAB — GLUCOSE, CAPILLARY
Glucose-Capillary: 128 mg/dL — ABNORMAL HIGH (ref 70–99)
Glucose-Capillary: 199 mg/dL — ABNORMAL HIGH (ref 70–99)
Glucose-Capillary: 198 mg/dL — ABNORMAL HIGH (ref 70–99)
Glucose-Capillary: 237 mg/dL — ABNORMAL HIGH (ref 70–99)

## 2021-02-18 MED ORDER — METHOCARBAMOL 500 MG PO TABS
500.0000 mg | ORAL_TABLET | Freq: Once | ORAL | Status: AC
  Administered 2021-02-18: 500 mg via ORAL
  Filled 2021-02-18: qty 1

## 2021-02-18 MED ORDER — GUAIFENESIN ER 600 MG PO TB12
600.0000 mg | ORAL_TABLET | Freq: Two times a day (BID) | ORAL | Status: AC
Start: 2021-02-18 — End: 2021-02-20
  Administered 2021-02-18 – 2021-02-20 (×6): 600 mg via ORAL
  Filled 2021-02-18 (×6): qty 1

## 2021-02-18 MED ORDER — APIXABAN 5 MG PO TABS
5.0000 mg | ORAL_TABLET | Freq: Two times a day (BID) | ORAL | Status: DC
  Administered 2021-02-18 – 2021-02-22 (×9): 5 mg via ORAL
  Filled 2021-02-18 (×9): qty 1

## 2021-02-18 MED ORDER — FUROSEMIDE 40 MG PO TABS
40.0000 mg | ORAL_TABLET | Freq: Once | ORAL | Status: AC
  Administered 2021-02-18: 40 mg via ORAL
  Filled 2021-02-18: qty 1

## 2021-02-18 MED ORDER — MORPHINE SULFATE (PF) 2 MG/ML IV SOLN
1.0000 mg | Freq: Once | INTRAVENOUS | Status: AC
  Administered 2021-02-18: 1 mg via INTRAVENOUS
  Filled 2021-02-18: qty 1

## 2021-02-18 MED ORDER — AMLODIPINE BESYLATE 10 MG PO TABS
10.0000 mg | ORAL_TABLET | Freq: Every day | ORAL | Status: DC
  Administered 2021-02-19 – 2021-02-22 (×4): 10 mg via ORAL
  Filled 2021-02-18 (×4): qty 1

## 2021-02-18 MED ORDER — METOPROLOL TARTRATE 50 MG PO TABS
100.0000 mg | ORAL_TABLET | Freq: Two times a day (BID) | ORAL | Status: DC
  Administered 2021-02-18 – 2021-02-22 (×8): 100 mg via ORAL
  Filled 2021-02-18 (×8): qty 2

## 2021-02-18 NOTE — Progress Notes (Addendum)
[facility]  ROUNDING NOTE   Subjective:   Nicole Schmidt is a 56 year old female with a past medical history of anemia, hypertension, dyslipidemia, diabetes, CHF, and chronic kidney disease.  Patient presents to the hospital with complaints of shortness of breath and sore throat which started late last week.  Patient has been admitted for Viral URI [J06.9] Cardiorenal syndrome with renal failure [I13.10] Cardiorenal syndrome with renal failure, stage 1-4 or unspecified chronic kidney disease, with heart failure ([place]) [I13.0]  Patient is known to our practice and is followed by Dr. Holley Raring.  Patient was last seen in our office in November 2022.    Patient sitting up in chair Appetite has improved Denies shortness of breath  Creatinine improved to 4.99 Urine output of 2.25L recorded in past 24 hours   Objective:  Vital signs in last 24 hours:  Temp:  [97.5 F (36.4 C)-98.4 F (36.9 C)] 98.1 F (36.7 C) (01/06 0754) Pulse Rate:  [80-100] 80 (01/06 0754) Resp:  [18-20] 18 (01/06 0754) BP: (137-162)/(58-85) 137/72 (01/06 0754) SpO2:  [95 %-98 %] 97 % (01/06 0754) Weight:  [161.6 kg] 161.6 kg (01/06 0700)  Weight change:  Filed Weights   02/13/21 0511 02/14/21 0500 02/18/21 0700  Weight: (!) 162.2 kg (!) 161.2 kg (!) 161.6 kg    Intake/Output: I/O last 3 completed shifts: In: 720 [P.O.:720] Out: 2250 [Urine:2250]   Intake/Output this shift:  Total I/O In: 240 [P.O.:240] Out: 900 [Urine:900]  Physical Exam: General: NAD, Sitting in chair  Head: Normocephalic, atraumatic. Moist oral mucosal membranes  Eyes: Anicteric  Lungs:  Diminished in bases, normal effort, 4L O2, cough  Heart: Irregular rhythm  Abdomen:  Soft, nontender  Extremities:  ++ peripheral edema.  Neurologic: Nonfocal, moving all four extremities       Basic Metabolic Panel: Recent Labs  Lab 02/14/21 0635 02/15/21 0627 02/16/21 0434 02/17/21 0742 02/18/21 0603  NA 142 141 137 142  140  K 5.2* 5.5* 5.4* 4.7 5.0  CL 102 104 99 102 104  CO2 26 25 24 25 24   GLUCOSE 130* 192* 174* 151* 139*  BUN 118* 113* 140* 141* 138*  CREATININE 5.71* 5.58* 5.63* 5.24* 4.99*  CALCIUM 10.0 9.7 9.7 10.0 9.9  PHOS  --   --   --  7.6* 8.3*     Liver Function Tests: Recent Labs  Lab 02/12/21 0626 02/13/21 0538 02/14/21 0635 02/15/21 0627 02/16/21 0434 02/17/21 0742 02/18/21 0603  AST 13* 15 18 16 15   --   --   ALT 16 18 24 23 27   --   --   ALKPHOS 97 99 104 103 96  --   --   BILITOT 0.6 0.6 0.7 0.6 0.6  --   --   PROT 7.6 7.4 7.9 7.6 7.6  --   --   ALBUMIN 3.4* 3.1* 3.4* 3.2* 3.3* 3.2* 3.2*    No results for input(s): LIPASE, AMYLASE in the last 168 hours. No results for input(s): AMMONIA in the last 168 hours.  CBC: Recent Labs  Lab 02/12/21 0626 02/14/21 0635 02/15/21 0627 02/16/21 0434 02/17/21 0742  WBC 15.7* 12.2* 11.4* 15.9* 14.7*  HGB 10.0* 10.7* 10.0* 9.5* 10.2*  HCT 33.9* 36.1 33.8* 31.6* 33.9*  MCV 92.9 93.0 92.1 91.3 91.4  PLT 229 248 265 270 275     Cardiac Enzymes: No results for input(s): CKTOTAL, CKMB, CKMBINDEX, TROPONINI in the last 168 hours.  BNP: Invalid input(s): POCBNP  CBG: Recent Labs  Lab 02/16/21 1618 02/17/21 0923 02/17/21 1142 02/17/21 1630 02/18/21 0757  GLUCAP 167* 131* 266* 115* 128*     Microbiology: Results for orders placed or performed during the hospital encounter of 02/09/21  Resp Panel by RT-PCR (Flu A&B, Covid) Throat     Status: None   Collection Time: 02/08/21 10:21 PM   Specimen: Throat; Nasopharyngeal(NP) swabs in vial transport medium  Result Value Ref Range Status   SARS Coronavirus 2 by RT PCR NEGATIVE NEGATIVE Final    Comment: (NOTE) SARS-CoV-2 target nucleic acids are NOT DETECTED.  The SARS-CoV-2 RNA is generally detectable in upper respiratory specimens during the acute phase of infection. The lowest concentration of SARS-CoV-2 viral copies this assay can detect is 138 copies/mL. A  negative result does not preclude SARS-Cov-2 infection and should not be used as the sole basis for treatment or other patient management decisions. A negative result may occur with  improper specimen collection/handling, submission of specimen other than nasopharyngeal swab, presence of viral mutation(s) within the areas targeted by this assay, and inadequate number of viral copies(<138 copies/mL). A negative result must be combined with clinical observations, patient history, and epidemiological information. The expected result is Negative.  Fact Sheet for Patients:  EntrepreneurPulse.com.au  Fact Sheet for Healthcare Providers:  IncredibleEmployment.be  This test is no t yet approved or cleared by the [place] FDA and  has been authorized for detection and/or diagnosis of SARS-CoV-2 by FDA under an Emergency Use Authorization (EUA). This EUA will remain  in effect (meaning this test can be used) for the duration of the COVID-19 declaration under Section 564(b)(1) of the Act, 21 U.S.C.section 360bbb-3(b)(1), unless the authorization is terminated  or revoked sooner.       Influenza A by PCR NEGATIVE NEGATIVE Final   Influenza B by PCR NEGATIVE NEGATIVE Final    Comment: (NOTE) The Xpert Xpress SARS-CoV-2/FLU/RSV plus assay is intended as an aid in the diagnosis of influenza from Nasopharyngeal swab specimens and should not be used as a sole basis for treatment. Nasal washings and aspirates are unacceptable for Xpert Xpress SARS-CoV-2/FLU/RSV testing.  Fact Sheet for Patients: EntrepreneurPulse.com.au  Fact Sheet for Healthcare Providers: IncredibleEmployment.be  This test is not yet approved or cleared by the [place] FDA and has been authorized for detection and/or diagnosis of SARS-CoV-2 by FDA under an Emergency Use Authorization (EUA). This EUA will remain in effect (meaning this test can  be used) for the duration of the COVID-19 declaration under Section 564(b)(1) of the Act, 21 U.S.C. section 360bbb-3(b)(1), unless the authorization is terminated or revoked.  Performed at [facility], [place], [place] [address]   Group A Strep by PCR [facility] Only)     Status: None   Collection Time: 02/08/21 10:21 PM   Specimen: Throat; Sterile Swab  Result Value Ref Range Status   Group A Strep by PCR NOT DETECTED NOT DETECTED Final    Comment: Performed at [facility], [address] [place], [place] [address]    Coagulation Studies: Recent Labs    02/15/21 1949  LABPROT 14.6  INR 1.1     Urinalysis: No results for input(s): COLORURINE, LABSPEC, PHURINE, GLUCOSEU, HGBUR, BILIRUBINUR, KETONESUR, PROTEINUR, UROBILINOGEN, NITRITE, LEUKOCYTESUR in the last 72 hours.  Invalid input(s): APPERANCEUR     Imaging: No results found.   Medications:    sodium chloride Stopped (02/14/21 1830)    amLODipine  5 mg Oral BID   apixaban  5 mg  Oral BID   vitamin C  500 mg Oral BID   atorvastatin  20 mg Oral QHS   calcitRIOL  0.25 mcg Oral Daily   calcium carbonate  500 mg of elemental calcium Oral Daily   cloNIDine  0.2 mg Oral BID   folic acid  9,179 mcg Oral Daily   insulin aspart  0-15 Units Subcutaneous TID WC   insulin aspart  4 Units Subcutaneous TID WC   insulin glargine-yfgn  15 Units Subcutaneous Daily   levothyroxine  112 mcg Oral QAC breakfast   melatonin  10 mg Oral QHS   metoprolol tartrate  75 mg Oral BID   nystatin   Topical BID   polyethylene glycol  17 g Oral Daily   sodium chloride flush  3 mL Intravenous Q12H   Vitamin D (Ergocalciferol)  50,000 Units Oral Q Fri   sodium chloride, acetaminophen, albuterol, guaiFENesin-dextromethorphan, ondansetron (ZOFRAN) IV, sodium chloride flush  Assessment/ Plan:  [name] is a 56 y.o.  female with medical problems of morbid obesity, anemia, hypertension, dyslipidemia,  diabetes, CHF, and chronic kidney disease.  Patient presents to the hospital with complaints of shortness of breath and sore throat which started late last week.  Patient has been admitted for Viral URI [J06.9] Cardiorenal syndrome with renal failure [I13.10] Cardiorenal syndrome with renal failure, stage 1-4 or unspecified chronic kidney disease, with heart failure ([place]) [I13.0]   Acute Kidney Injury on chronic kidney disease stage IV .  Patient has AKI secondary to ATN and possibly from obstructive uropathy from the left ureteropelvic kidney stone.  Baseline creatinine of 2.15/GFR 26 from December 30, 2020 Appreciate urology placement of stent on 02/15/20.   -Creatinine 4.99  - Will order one tim does of Furosemide 40mg  po for increased BLE edema  -Will continue to monitor   - No acute need for dialysis but monitoring closely  Lab Results  Component Value Date   CREATININE 4.99 (H) 02/18/2021   CREATININE 5.24 (H) 02/17/2021   CREATININE 5.63 (H) 02/16/2021    Intake/Output Summary (Last 24 hours) at 02/18/2021 0959 Last data filed at 02/18/2021 0754 Gross per 24 hour  Intake 720 ml  Output 1550 ml  Net -830 ml    2. Anemia of chronic kidney disease Lab Results  Component Value Date   HGB 10.2 (L) 02/17/2021    Will continue to monitor.  3. Secondary Hyperparathyroidism:  Lab Results  Component Value Date   PTH 94 (H) 11/18/2017   CALCIUM 9.9 02/18/2021   PHOS 8.3 (H) 02/18/2021  Phosphorus continues to rise. Should improve with continued renal recovery  4.  Left kidney stone with hydronephrosis Urology consultation. Left ureteral stent placed for left proximal stone.   LOS: 9 Nijah Orlich 1/6/20239:59 AM

## 2021-02-18 NOTE — Progress Notes (Addendum)
PROGRESS NOTE  Nicole Schmidt    DOB: April 21, 1965, 56 y.o.  TMH:[id number]  PCP: Lenard Simmer, MD   Code Status: Full Code   DOA: 02/09/2021   LOS: 9  Brief Narrative of Current Hospitalization  Nicole Schmidt is a 56 y.o. female with a PMH significant for HFpEF, hypothyroidism, anemia, arthritis, history of breast cancer, CKD 3, DM, HLD, HTN, gout, obesity, A. fib, history of PE. They presented from home to the ED on 02/09/2021 with sore throat, shortness of breath x a couple days. In the ED, it was found that they had acute on chronic respiratory failure, acute renal failure.  They were seen by nephrology and urology for ARF and obstructive ureteral stone.  Patient received left ureteral stent on 1/2.   Patient was admitted to medicine service for further workup and management of ARF and PNA as outlined in detail below.  02/18/21 -stable  Assessment & Plan  Principal Problem:   Cardiorenal syndrome, stage 1-4 or unspecified chronic kidney disease, with heart failure (HCC) Active Problems:   History of pulmonary embolus (PE)   Acute renal failure superimposed on stage 3 chronic kidney disease (HCC)   Iron deficiency anemia   Goals of care, counseling/discussion   OSA on CPAP   Atrial fibrillation, chronic (HCC)   Dyslipidemia   Diabetes mellitus type 2 in obese [facility])   Acquired hypothyroidism   Physical deconditioning   Chronic respiratory failure with hypoxia (HCC)   Bronchitis  ARF on Stage 4 chronic kidney disease- Cr stable with gradual improvement 5.6>>>4.99. baseline around 2.1 Left hydronephrosis 2/2 ureteral stone- s/p ureteral stent placement Jan 2nd. - nephrology following, appreciate recs  - no need for urgent dialysis currently - urology following, appreciate recs  - OP stent removal on follow up - strict I/O - RFP daily  Hyperkalemia- resolved. - monitoring and PRN replacement   Chronic respiratory failure with hypoxia ([place])- 2L O2 requirement at  baseline. Currently requiring 2-4L to maintain sats >90%.  CAP- completed antibiotic course - wean O2 as able - breathing treatments PRN - mucinex   Acquired hypothyroidism - continue levothyroxine   Diabetes mellitus type 2 in obese ([place])- hgb A1c 7.2 on admission.  - sSSI - continue daily semglee 15units   HFpEF   Afib   Dyslipidemia   HTN- BP moderately well controlled.  - Continue statin, eliquis - home meds include amlodipine, metoprolol and clonidine.  - Titrating for better control. Once renal function improves, would recommend to PCP to consider alternate medications such as ARB with secondary benefits for heart/kidney protection.   History of pulmonary embolus (PE) - eliquis indefinitely   Iron deficiency anemia - Monitor CBC   OSA on CPAP - CPAP ordered nightly  DVT prophylaxis: eliquis  Diet:  Diet Orders (From admission, onward)     Start     Ordered   02/14/21 1758  Diet heart healthy/carb modified Room service appropriate? Yes; Fluid consistency: Thin  Diet effective now       Question Answer Comment  Diet-HS Snack? Nothing   Room service appropriate? Yes   Fluid consistency: Thin      02/14/21 1757            Subjective 02/18/21    Pt reports no complaints today. Feeling well. She would like some decongestants and would appreciate a shower.   Disposition Plan & Communication  Patient status: Inpatient  Admitted From: Home Disposition: Home Anticipated discharge date: TBD  Family  Communication: none  Consults, Procedures, Significant Events  Consultants:  Nephrology Urology   Procedures/significant events:  Uretal stent placement 1/2  Antimicrobials:  Anti-infectives (From admission, onward)    Start     Dose/Rate Route Frequency Ordered Stop   02/11/21 1800  cefTRIAXone (ROCEPHIN) 2 g in sodium chloride 0.9 % 100 mL IVPB        2 g 200 mL/hr over 30 Minutes Intravenous Every 24 hours 02/11/21 1710 02/15/21 1810   02/11/21 1800   azithromycin (ZITHROMAX) tablet 500 mg        500 mg Oral Daily 02/11/21 1710 02/15/21 0900       Objective   Vitals:   02/17/21 1631 02/17/21 2005 02/17/21 2337 02/18/21 0600  BP: (!) 142/80 (!) 157/58 (!) 162/80 (!) 143/85  Pulse: 87 91 100 82  Resp: 18 18 18 20   Temp: 97.9 F (36.6 C) 97.8 F (36.6 C) 97.7 F (36.5 C) (!) 97.5 F (36.4 C)  TempSrc:  Oral  Oral  SpO2: 97% 98% 95% 97%  Weight:      Height:        Intake/Output Summary (Last 24 hours) at 02/18/2021 6789 Last data filed at 02/17/2021 2014 Gross per 24 hour  Intake 720 ml  Output 2250 ml  Net -1530 ml    Filed Weights   02/12/21 0139 02/13/21 0511 02/14/21 0500  Weight: (!) 160.1 kg (!) 162.2 kg (!) 161.2 kg    Patient BMI: Body mass index is 60.99 kg/m.   Physical Exam:  General: awake, alert, NAD Respiratory: normal respiratory effort. Cardiovascular: normal S1/S2, RRR, no JVD, murmurs, quick capillary refill  Nervous: A&O x3. no gross focal neurologic deficits, normal speech Skin: dry, intact, normal temperature, normal color. No rashes, lesions or ulcers on exposed skin Psychiatry: normal mood, congruent affect  Labs   I have personally reviewed following labs and imaging studies CBC    Component Value Date/Time   WBC 14.7 (H) 02/17/2021 0742   RBC 3.71 (L) 02/17/2021 0742   HGB 10.2 (L) 02/17/2021 0742   HGB 9.9 (L) 02/27/2020 0931   HCT 33.9 (L) 02/17/2021 0742   HCT 32.2 (L) 02/27/2020 0931   PLT 275 02/17/2021 0742   PLT 232 02/27/2020 0931   MCV 91.4 02/17/2021 0742   MCV 89 02/27/2020 0931   MCV 90 03/09/2014 1533   MCH 27.5 02/17/2021 0742   MCHC 30.1 02/17/2021 0742   RDW 15.2 02/17/2021 0742   RDW 13.0 02/27/2020 0931   RDW 15.2 (H) 03/09/2014 1533   LYMPHSABS 1.6 01/10/2021 1356   LYMPHSABS 1.5 03/09/2014 1533   MONOABS 0.9 01/10/2021 1356   MONOABS 0.6 03/09/2014 1533   EOSABS 0.3 01/10/2021 1356   EOSABS 0.2 03/09/2014 1533   BASOSABS 0.1 01/10/2021 1356   BASOSABS  0.1 03/09/2014 1533   BMP Latest Ref Rng & Units 02/17/2021 02/16/2021 02/15/2021  Glucose 70 - 99 mg/dL 151(H) 174(H) 192(H)  BUN 6 - 20 mg/dL 141(H) 140(H) 113(H)  Creatinine 0.44 - 1.00 mg/dL 5.24(H) 5.63(H) 5.58(H)  BUN/Creat Ratio 9 - 23 - - -  Sodium 135 - 145 mmol/L 142 137 141  Potassium 3.5 - 5.1 mmol/L 4.7 5.4(H) 5.5(H)  Chloride 98 - 111 mmol/L 102 99 104  CO2 22 - 32 mmol/L 25 24 25   Calcium 8.9 - 10.3 mg/dL 10.0 9.7 9.7   Imaging Studies  No results found.  Medications   Scheduled Meds:  amLODipine  5 mg Oral BID  vitamin C  500 mg Oral BID   atorvastatin  20 mg Oral QHS   calcitRIOL  0.25 mcg Oral Daily   calcium carbonate  500 mg of elemental calcium Oral Daily   cloNIDine  0.2 mg Oral BID   enoxaparin (LOVENOX) injection  1 mg/kg Subcutaneous E17E   folic acid  0,814 mcg Oral Daily   insulin aspart  0-15 Units Subcutaneous TID WC   insulin aspart  4 Units Subcutaneous TID WC   insulin glargine-yfgn  15 Units Subcutaneous Daily   levothyroxine  112 mcg Oral QAC breakfast   melatonin  10 mg Oral QHS   metoprolol tartrate  75 mg Oral BID   nystatin   Topical BID   polyethylene glycol  17 g Oral Daily   sodium chloride flush  3 mL Intravenous Q12H   Vitamin D (Ergocalciferol)  50,000 Units Oral Q Fri   No recently discontinued medications to reconcile  LOS: 9 days   Richarda Osmond, DO [facility] 02/18/2021, 7:02 AM   Available by Epic secure chat 7AM-7PM. If 7PM-7AM, please contact night-coverage Refer to [email or web address] to contact the [facility] Attending or Consulting provider for this pt

## 2021-02-18 NOTE — Progress Notes (Signed)
Mobility Specialist - Progress Note   02/18/21 1500  Mobility  Activity Refused mobility  Mobility performed by Mobility specialist    2nd attempt this date. Pt declined mobility---just finished bathing tasks with NT. Will attempt session another date/time.    Kathee Delton Mobility Specialist 02/18/21, 3:43 PM

## 2021-02-19 DIAGNOSIS — E875 Hyperkalemia: Secondary | ICD-10-CM | POA: Diagnosis present

## 2021-02-19 DIAGNOSIS — N132 Hydronephrosis with renal and ureteral calculous obstruction: Secondary | ICD-10-CM | POA: Diagnosis present

## 2021-02-19 DIAGNOSIS — N184 Chronic kidney disease, stage 4 (severe): Secondary | ICD-10-CM

## 2021-02-19 DIAGNOSIS — I130 Hypertensive heart and chronic kidney disease with heart failure and stage 1 through stage 4 chronic kidney disease, or unspecified chronic kidney disease: Secondary | ICD-10-CM | POA: Diagnosis not present

## 2021-02-19 DIAGNOSIS — I5033 Acute on chronic diastolic (congestive) heart failure: Secondary | ICD-10-CM

## 2021-02-19 DIAGNOSIS — E039 Hypothyroidism, unspecified: Secondary | ICD-10-CM | POA: Diagnosis not present

## 2021-02-19 DIAGNOSIS — N170 Acute kidney failure with tubular necrosis: Secondary | ICD-10-CM | POA: Diagnosis not present

## 2021-02-19 DIAGNOSIS — J189 Pneumonia, unspecified organism: Secondary | ICD-10-CM | POA: Diagnosis present

## 2021-02-19 LAB — RENAL FUNCTION PANEL
Albumin: 3.1 g/dL — ABNORMAL LOW (ref 3.5–5.0)
Anion gap: 15 (ref 5–15)
BUN: 123 mg/dL — ABNORMAL HIGH (ref 6–20)
CO2: 23 mmol/L (ref 22–32)
Calcium: 9.9 mg/dL (ref 8.9–10.3)
Chloride: 104 mmol/L (ref 98–111)
Creatinine, Ser: 4.99 mg/dL — ABNORMAL HIGH (ref 0.44–1.00)
GFR, Estimated: 10 mL/min — ABNORMAL LOW (ref >60)
Glucose, Bld: 145 mg/dL — ABNORMAL HIGH (ref 70–99)
Phosphorus: 8.1 mg/dL — ABNORMAL HIGH (ref 2.5–4.6)
Potassium: 5.2 mmol/L — ABNORMAL HIGH (ref 3.5–5.1)
Sodium: 142 mmol/L (ref 135–145)

## 2021-02-19 LAB — GLUCOSE, CAPILLARY
Glucose-Capillary: 168 mg/dL — ABNORMAL HIGH (ref 70–99)
Glucose-Capillary: 166 mg/dL — ABNORMAL HIGH (ref 70–99)
Glucose-Capillary: 179 mg/dL — ABNORMAL HIGH (ref 70–99)
Glucose-Capillary: 195 mg/dL — ABNORMAL HIGH (ref 70–99)

## 2021-02-19 NOTE — Assessment & Plan Note (Signed)
Mild - Trend potassium

## 2021-02-19 NOTE — Hospital Course (Signed)
Mrs. Repetto is a 56 y.o. F with dCHF, hypothyroidism, anemia, CKD IV, DM, HTN, gout, obesity, afib and hx PE on Eliquis who presented with SOB for several days.  In the ER, found to be swollen, Cr up to 4.59 from baseline 1.7-2.4, BNP elevated, CXR wth bilateral infiltrates.  Imaging of abdomen showed some left hydronephrosis.  Admitted and started on diuretics and antibiotics.  Nephrology consulted.

## 2021-02-19 NOTE — Assessment & Plan Note (Signed)
Head dyspnea on exertion, swelling, chest x-ray with bilateral opacities, and cardiomegaly on admission.  Was initially diuresed with Lasix, now on hold.  Seems somewhat better overall.

## 2021-02-19 NOTE — Progress Notes (Addendum)
Progress Note   Patient: Nicole Schmidt NFA:[id number] DOB: 20-May-1965 DOA: 02/09/2021     10 DOS: the patient was seen and examined on 02/19/2021      Brief hospital course: [name] is a 56 y.o. F with dCHF, hypothyroidism, anemia, CKD IV, DM, HTN, gout, obesity, afib and hx PE on Eliquis who presented with SOB for several days.  In the ER, found to be swollen, Cr up to 4.59 from baseline 1.7-2.4, BNP elevated, CXR wth bilateral infiltrates.  Imaging of abdomen showed some left hydronephrosis.  Admitted and started on diuretics and antibiotics.  Nephrology consulted.      Assessment and Plan * Acute renal failure superimposed on stage 4 chronic kidney disease ([place])- (present on admission) UOP actually good again, >2L.  BUN going down.  Mild hyperkalemia, no significant fluid overload.  -Continue I/Os - Hold nephrotoxins  Cardiorenal syndrome, stage 1-4 or unspecified chronic kidney disease, with heart failure (HCC)- (present on admission) - Hold furosemide  Acute on chronic diastolic CHF (congestive heart failure) ([name])- (present on admission) Head dyspnea on exertion, swelling, chest x-ray with bilateral opacities, and cardiomegaly on admission.  Was initially diuresed with Lasix, now on hold.  Seems somewhat better overall.  Hydronephrosis with renal and ureteral calculus obstruction- (present on admission) Patient noted to have hydronephrosis on renal ultrasound.  CT confirmed a stone.  Urology were consulted, placed the left ureteral stent, now resolved. -Follow-up with urology after discharge  Multifocal pneumonia- (present on admission) Completed 5 days Rocephin and azithromycin.  Chronic respiratory failure with hypoxia (HCC)- (present on admission) Patient chronically on 2 L of oxygen, no known COPD, consider outpatient follow-up with PFT if not done recently  History of pulmonary embolus (PE)- (present on admission) -Continue Eliquis  Hyperkalemia- (present on  admission) Mild - Trend potassium  Atrial fibrillation, chronic (HCC)- (present on admission) - Continue apixaban - Continue metoprolol  Diabetes mellitus type 2 in obese ([name])- (present on admission) - Continue glargine - Continue sliding scale corrections Glucose controlled  Essential hypertension- (present on admission) Blood pressure controlled - Continue amlodipine, clonidine, metoprolol   OSA on CPAP - CPAP ordered nightly  Iron deficiency anemia- (present on admission) Hgb stable, no clinical bleeding  Acquired hypothyroidism- (present on admission) -Continue levothyroxine  Dyslipidemia- (present on admission) - Continue statin  Goals of care, counseling/discussion Patient request full code, states she would not want to be put on life support indefinitely.  She is not married/separated.  She has 2 cousins, Daine Floras and Marlowe Kays, that she would trust to make medical decisions for her and she states their information should be on file.  I had this conversation 02/09/2021 approximately 12 PM       Subjective: Still feeling quite swollen, has a little pinkish urine, no fever, chills.  No flank pain last night.  Urine output seems better.  No confusion.  No dyspnea.  Objective Vital signs were reviewed and unremarkable. General appearance: Obese adult female, sitting up in recliner, no acute distress, nasal cannula in place     HEENT:    Skin:  Cardiac: RRR, no murmurs, brawny lower extremity edema bilaterally Respiratory: Normal respiratory rate and rhythm Abdomen:   MSK:  Neuro: Awake alert, face symmetric, speech fluent, moves upper extremities with normal strength and coordination Psych: Attention normal, affect normal, judgment Syprine normal   Data Reviewed:  My review of labs and imaging is notable for creatinine 4.99 today, no change, potassium 5.2, BUN down to 123, complete  blood count normal for increased white blood cell count, hemoglobin down to 10 but  stable, CT recently with hydronephrosis, TSH low, A1c 7.2, echocardiogram with normal EF  Family Communication: cousin by phone  Disposition: Status is: Inpatient  Remains inpatient appropriate because: She has severe acute on chronic renal failure with multiple electrolyte abnormalities and fluid overload.  Nephrology will continue supportive care for her kidneys, monitor renal function, for possible renal replacement therapy.  Discussed with Dr. Holley Raring.  In the next few days if her urine output continues to be good and we start to see her creatinine go down, it is possible that she will be able to discharge early this week.          Author: Edwin Dada, MD 02/19/2021 4:30 PM  For on call review [email or web address].

## 2021-02-19 NOTE — Assessment & Plan Note (Signed)
Completed 5 days Rocephin and azithromycin.

## 2021-02-19 NOTE — Assessment & Plan Note (Signed)
UOP actually good again, >2L.  BUN going down.  Mild hyperkalemia, no significant fluid overload.  -Continue I/Os - Hold nephrotoxins

## 2021-02-19 NOTE — Progress Notes (Signed)
[facility]  ROUNDING NOTE   Subjective:   Nicole Schmidt is a 56 year old female with a past medical history of anemia, hypertension, dyslipidemia, diabetes, CHF, and chronic kidney disease.  Patient presents to the hospital with complaints of shortness of breath and sore throat which started late last week.  Patient has been admitted for Viral URI [J06.9] Cardiorenal syndrome with renal failure [I13.10] Cardiorenal syndrome with renal failure, stage 1-4 or unspecified chronic kidney disease, with heart failure ([place]) [I13.0]  Patient is known to our practice and is followed by Dr. Holley Raring.  Patient was last seen in our office in November 2022.    Patient seen sitting up in chair, no acute distress noted Alert and oriented Tolerating meals and fluids appropriately Denies shortness of breath, weight to 2 L [name] Improved lower extremity edema  Creatinine unchanged at 4.99 today Urine output of 2 L in preceding 24 hours   Objective:  Vital signs in last 24 hours:  Temp:  [97.6 F (36.4 C)-98.7 F (37.1 C)] 98.7 F (37.1 C) (01/07 0700) Pulse Rate:  [77-98] 85 (01/07 0700) Resp:  [17-20] 17 (01/07 0700) BP: (134-169)/(62-120) 141/80 (01/07 0700) SpO2:  [93 %-98 %] 98 % (01/07 0700) Weight:  [162.5 kg] 162.5 kg (01/07 0500)  Weight change: 0.907 kg Filed Weights   02/14/21 0500 02/18/21 0700 02/19/21 0500  Weight: (!) 161.2 kg (!) 161.6 kg (!) 162.5 kg    Intake/Output: I/O last 3 completed shifts: In: 0786 [P.O.:1560; I.V.:5] Out: 2050 [Urine:2050]   Intake/Output this shift:  No intake/output data recorded.  Physical Exam: General: NAD, Sitting in chair  Head: Normocephalic, atraumatic. Moist oral mucosal membranes  Eyes: Anicteric  Lungs:  Diminished in bases, normal effort, 2L O2, cough  Heart: Irregular rhythm  Abdomen:  Soft, nontender  Extremities:  + peripheral edema.  Neurologic: Nonfocal, moving all four extremities       Basic Metabolic  Panel: Recent Labs  Lab 02/15/21 0627 02/16/21 0434 02/17/21 0742 02/18/21 0603 02/19/21 0439  NA 141 137 142 140 142  K 5.5* 5.4* 4.7 5.0 5.2*  CL 104 99 102 104 104  CO2 25 24 25 24 23   GLUCOSE 192* 174* 151* 139* 145*  BUN 113* 140* 141* 138* 123*  CREATININE 5.58* 5.63* 5.24* 4.99* 4.99*  CALCIUM 9.7 9.7 10.0 9.9 9.9  PHOS  --   --  7.6* 8.3* 8.1*     Liver Function Tests: Recent Labs  Lab 02/13/21 0538 02/14/21 0635 02/15/21 0627 02/16/21 0434 02/17/21 0742 02/18/21 0603 02/19/21 0439  AST 15 18 16 15   --   --   --   ALT 18 24 23 27   --   --   --   ALKPHOS 99 104 103 96  --   --   --   BILITOT 0.6 0.7 0.6 0.6  --   --   --   PROT 7.4 7.9 7.6 7.6  --   --   --   ALBUMIN 3.1* 3.4* 3.2* 3.3* 3.2* 3.2* 3.1*    No results for input(s): LIPASE, AMYLASE in the last 168 hours. No results for input(s): AMMONIA in the last 168 hours.  CBC: Recent Labs  Lab 02/14/21 0635 02/15/21 0627 02/16/21 0434 02/17/21 0742  WBC 12.2* 11.4* 15.9* 14.7*  HGB 10.7* 10.0* 9.5* 10.2*  HCT 36.1 33.8* 31.6* 33.9*  MCV 93.0 92.1 91.3 91.4  PLT 248 265 270 275     Cardiac Enzymes: No results for  input(s): CKTOTAL, CKMB, CKMBINDEX, TROPONINI in the last 168 hours.  BNP: Invalid input(s): POCBNP  CBG: Recent Labs  Lab 02/17/21 1630 02/18/21 0757 02/18/21 1221 02/18/21 1639 02/18/21 2103  GLUCAP 115* 128* 199* 198* 237*     Microbiology: Results for orders placed or performed during the hospital encounter of 02/09/21  Resp Panel by RT-PCR (Flu A&B, Covid) Throat     Status: None   Collection Time: 02/08/21 10:21 PM   Specimen: Throat; Nasopharyngeal(NP) swabs in vial transport medium  Result Value Ref Range Status   SARS Coronavirus 2 by RT PCR NEGATIVE NEGATIVE Final    Comment: (NOTE) SARS-CoV-2 target nucleic acids are NOT DETECTED.  The SARS-CoV-2 RNA is generally detectable in upper respiratory specimens during the acute phase of infection. The  lowest concentration of SARS-CoV-2 viral copies this assay can detect is 138 copies/mL. A negative result does not preclude SARS-Cov-2 infection and should not be used as the sole basis for treatment or other patient management decisions. A negative result may occur with  improper specimen collection/handling, submission of specimen other than nasopharyngeal swab, presence of viral mutation(s) within the areas targeted by this assay, and inadequate number of viral copies(<138 copies/mL). A negative result must be combined with clinical observations, patient history, and epidemiological information. The expected result is Negative.  Fact Sheet for Patients:  EntrepreneurPulse.com.au  Fact Sheet for Healthcare Providers:  IncredibleEmployment.be  This test is no t yet approved or cleared by the [place] FDA and  has been authorized for detection and/or diagnosis of SARS-CoV-2 by FDA under an Emergency Use Authorization (EUA). This EUA will remain  in effect (meaning this test can be used) for the duration of the COVID-19 declaration under Section 564(b)(1) of the Act, 21 U.S.C.section 360bbb-3(b)(1), unless the authorization is terminated  or revoked sooner.       Influenza A by PCR NEGATIVE NEGATIVE Final   Influenza B by PCR NEGATIVE NEGATIVE Final    Comment: (NOTE) The Xpert Xpress SARS-CoV-2/FLU/RSV plus assay is intended as an aid in the diagnosis of influenza from Nasopharyngeal swab specimens and should not be used as a sole basis for treatment. Nasal washings and aspirates are unacceptable for Xpert Xpress SARS-CoV-2/FLU/RSV testing.  Fact Sheet for Patients: EntrepreneurPulse.com.au  Fact Sheet for Healthcare Providers: IncredibleEmployment.be  This test is not yet approved or cleared by the [place] FDA and has been authorized for detection and/or diagnosis of SARS-CoV-2 by FDA under  an Emergency Use Authorization (EUA). This EUA will remain in effect (meaning this test can be used) for the duration of the COVID-19 declaration under Section 564(b)(1) of the Act, 21 U.S.C. section 360bbb-3(b)(1), unless the authorization is terminated or revoked.  Performed at [facility], [place], [place] [address]   Group A Strep by PCR [facility] Only)     Status: None   Collection Time: 02/08/21 10:21 PM   Specimen: Throat; Sterile Swab  Result Value Ref Range Status   Group A Strep by PCR NOT DETECTED NOT DETECTED Final    Comment: Performed at [facility], [address] [place], [place] [address]    Coagulation Studies: No results for input(s): LABPROT, INR in the last 72 hours.   Urinalysis: No results for input(s): COLORURINE, LABSPEC, PHURINE, GLUCOSEU, HGBUR, BILIRUBINUR, KETONESUR, PROTEINUR, UROBILINOGEN, NITRITE, LEUKOCYTESUR in the last 72 hours.  Invalid input(s): APPERANCEUR     Imaging: No results found.   Medications:    sodium chloride Stopped (02/14/21  1830)    amLODipine  10 mg Oral Daily   apixaban  5 mg Oral BID   vitamin C  500 mg Oral BID   atorvastatin  20 mg Oral QHS   calcitRIOL  0.25 mcg Oral Daily   calcium carbonate  500 mg of elemental calcium Oral Daily   cloNIDine  0.2 mg Oral BID   folic acid  4,492 mcg Oral Daily   guaiFENesin  600 mg Oral BID   insulin aspart  0-15 Units Subcutaneous TID WC   insulin glargine-yfgn  15 Units Subcutaneous Daily   levothyroxine  112 mcg Oral QAC breakfast   melatonin  10 mg Oral QHS   metoprolol tartrate  100 mg Oral BID   nystatin   Topical BID   polyethylene glycol  17 g Oral Daily   sodium chloride flush  3 mL Intravenous Q12H   Vitamin D (Ergocalciferol)  50,000 Units Oral Q Fri   sodium chloride, acetaminophen, albuterol, ondansetron (ZOFRAN) IV, sodium chloride flush  Assessment/ Plan:  Nicole Schmidt is a 56 y.o.  female with medical problems  of morbid obesity, anemia, hypertension, dyslipidemia, diabetes, CHF, and chronic kidney disease.  Patient presents to the hospital with complaints of shortness of breath and sore throat which started late last week.  Patient has been admitted for Viral URI [J06.9] Cardiorenal syndrome with renal failure [I13.10] Cardiorenal syndrome with renal failure, stage 1-4 or unspecified chronic kidney disease, with heart failure ([place]) [I13.0]   Acute Kidney Injury on chronic kidney disease stage IV .  Patient has AKI secondary to ATN and possibly from obstructive uropathy from the left ureteropelvic kidney stone.  Baseline creatinine of 2.15/GFR 26 from December 30, 2020 Appreciate urology placement of stent on 02/15/20.   -Creatinine remains 4.99  -Would prefer to monitor downtrend in creatinine for couple days before considering discharge.  We will prefer patient closer to baseline.  -Will continue to monitor   - No acute need for dialysis but monitoring closely  Lab Results  Component Value Date   CREATININE 4.99 (H) 02/19/2021   CREATININE 4.99 (H) 02/18/2021   CREATININE 5.24 (H) 02/17/2021    Intake/Output Summary (Last 24 hours) at 02/19/2021 0946 Last data filed at 02/19/2021 0500 Gross per 24 hour  Intake 1085 ml  Output 1150 ml  Net -65 ml    2. Anemia of chronic kidney disease Lab Results  Component Value Date   HGB 10.2 (L) 02/17/2021    Hemoglobin within acceptable range  3. Secondary Hyperparathyroidism:  Lab Results  Component Value Date   PTH 94 (H) 11/18/2017   CALCIUM 9.9 02/19/2021   PHOS 8.1 (H) 02/19/2021  Calcium at goal.  Phosphorus remains elevated.  We will continue to monitor bone minerals during this admission  4.  Left kidney stone with hydronephrosis Appreciate urology placing left ureteral stent placed for left proximal stone.   LOS: [place] 1/7/20239:46 AM

## 2021-02-19 NOTE — Assessment & Plan Note (Addendum)
Patient noted to have hydronephrosis on renal ultrasound.  CT confirmed a stone.  Urology were consulted, placed the left ureteral stent, now resolved. -Follow-up with urology after discharge

## 2021-02-19 NOTE — Assessment & Plan Note (Signed)
Blood pressure controlled - Continue amlodipine, clonidine, metoprolol

## 2021-02-20 DIAGNOSIS — E039 Hypothyroidism, unspecified: Secondary | ICD-10-CM | POA: Diagnosis not present

## 2021-02-20 DIAGNOSIS — N170 Acute kidney failure with tubular necrosis: Secondary | ICD-10-CM | POA: Diagnosis not present

## 2021-02-20 DIAGNOSIS — I5033 Acute on chronic diastolic (congestive) heart failure: Secondary | ICD-10-CM | POA: Diagnosis not present

## 2021-02-20 DIAGNOSIS — I130 Hypertensive heart and chronic kidney disease with heart failure and stage 1 through stage 4 chronic kidney disease, or unspecified chronic kidney disease: Secondary | ICD-10-CM | POA: Diagnosis not present

## 2021-02-20 LAB — CBC
HCT: 30.8 % — ABNORMAL LOW (ref 36.0–46.0)
Hemoglobin: 9.2 g/dL — ABNORMAL LOW (ref 12.0–15.0)
MCH: 27.1 pg (ref 26.0–34.0)
MCHC: 29.9 g/dL — ABNORMAL LOW (ref 30.0–36.0)
MCV: 90.6 fL (ref 80.0–100.0)
Platelets: 251 10*3/uL (ref 150–400)
RBC: 3.4 MIL/uL — ABNORMAL LOW (ref 3.87–5.11)
RDW: 15.7 % — ABNORMAL HIGH (ref 11.5–15.5)
WBC: 14.6 10*3/uL — ABNORMAL HIGH (ref 4.0–10.5)
nRBC: 0 % (ref 0.0–0.2)

## 2021-02-20 LAB — RENAL FUNCTION PANEL
Albumin: 2.8 g/dL — ABNORMAL LOW (ref 3.5–5.0)
Anion gap: 10 (ref 5–15)
BUN: 120 mg/dL — ABNORMAL HIGH (ref 6–20)
CO2: 25 mmol/L (ref 22–32)
Calcium: 9.9 mg/dL (ref 8.9–10.3)
Chloride: 106 mmol/L (ref 98–111)
Creatinine, Ser: 5.12 mg/dL — ABNORMAL HIGH (ref 0.44–1.00)
GFR, Estimated: 9 mL/min — ABNORMAL LOW (ref >60)
Glucose, Bld: 135 mg/dL — ABNORMAL HIGH (ref 70–99)
Phosphorus: 7.9 mg/dL — ABNORMAL HIGH (ref 2.5–4.6)
Potassium: 5.2 mmol/L — ABNORMAL HIGH (ref 3.5–5.1)
Sodium: 141 mmol/L (ref 135–145)

## 2021-02-20 LAB — GLUCOSE, CAPILLARY
Glucose-Capillary: 141 mg/dL — ABNORMAL HIGH (ref 70–99)
Glucose-Capillary: 173 mg/dL — ABNORMAL HIGH (ref 70–99)
Glucose-Capillary: 217 mg/dL — ABNORMAL HIGH (ref 70–99)
Glucose-Capillary: 186 mg/dL — ABNORMAL HIGH (ref 70–99)

## 2021-02-20 NOTE — Assessment & Plan Note (Signed)
Completed 5 days Rocephin and azithromycin.

## 2021-02-20 NOTE — Assessment & Plan Note (Signed)
Patient chronically on 2 L of oxygen, no known COPD, consider outpatient follow-up with PFT if not done recently

## 2021-02-20 NOTE — Assessment & Plan Note (Signed)
Continue levothyroxine 

## 2021-02-20 NOTE — Assessment & Plan Note (Addendum)
Had dyspnea on exertion, swelling, chest x-ray with bilateral opacities, and cardiomegaly on admission.  Was initially diuresed with Lasix, now on hold.  Still has some CHF, diuresis limited with renal failure.

## 2021-02-20 NOTE — Progress Notes (Addendum)
Progress Note   Patient: Nicole Schmidt BDZ:[id number] DOB: 09-09-1965 DOA: 02/09/2021     11 DOS: the patient was seen and examined on 02/20/2021   Brief hospital course: Nicole Schmidt is a 56 y.o. F with dCHF, hypothyroidism, anemia, CKD IV, DM, HTN, gout, obesity, afib and hx PE on Eliquis who presented with SOB for several days.  In the ER, found to be swollen, Cr up to 4.59 from baseline 1.7-2.4, BNP elevated, CXR wth bilateral infiltrates.  Imaging of abdomen showed some left hydronephrosis.  Admitted and started on diuretics and antibiotics.  Nephrology consulted.   Assessment and Plan * Acute renal failure superimposed on stage 4 chronic kidney disease ([place])- (present on admission) UOP was 2L for 2 days, but yesterday only 450 cc recorded out.  HyperK no change.  Still swollen.    Cr slightly up, BUN no change still 120  - Continue I/Os - Hold nephrotoxins - Consult nephrology, re: HD  Cardiorenal syndrome, stage 1-4 or unspecified chronic kidney disease, with heart failure ([place])- (present on admission) - Hold furosemide  Acute on chronic diastolic CHF (congestive heart failure) ([place])- (present on admission) Had dyspnea on exertion, swelling, chest x-ray with bilateral opacities, and cardiomegaly on admission.  Was initially diuresed with Lasix, now on hold.  Still has some CHF, diuresis limited with renal failure.  Hydronephrosis with renal and ureteral calculus obstruction- (present on admission) Patient noted to have hydronephrosis on renal ultrasound on admission.  CT confirmed a stone.  Urology were consulted, placed the left ureteral stent.  Has had intermittent gross hematuria.  About 150 cc of blood today, discussed with Dr. Claudia Desanctis, Urology, this is within expected range, no intervention needed. - Monitor Hgb - Monitor hematuria -Follow-up with urology after discharge  Multifocal pneumonia- (present on admission) Completed 5 days Rocephin and azithromycin.  Chronic  respiratory failure with hypoxia (HCC)- (present on admission) Patient chronically on 2 L of oxygen, no known COPD, consider outpatient follow-up with PFT if not done recently  History of pulmonary embolus (PE)- (present on admission) -Continue Eliquis  Hyperkalemia- (present on admission) Mild - Trend potassium  Atrial fibrillation, chronic ([place])- (present on admission) - Continue apixaban - Continue metoprolol  Diabetes mellitus type 2 in obese ([place])- (present on admission) Glucose normal - Continue glargine - Continue sliding scale corrections -Hold Trulicity    Essential hypertension- (present on admission) Blood pressure controlled - Continue amlodipine, clonidine, metoprolol   OSA on CPAP - CPAP ordered nightly  Iron deficiency anemia- (present on admission) And anemia of CKD.  Hgb no change  Acquired hypothyroidism- (present on admission) -Continue levothyroxine  Dyslipidemia- (present on admission) - Continue statin  Goals of care, counseling/discussion Patient request full code, states she would not want to be put on life support indefinitely.  She is not married/separated.  She has 2 cousins, Nicole Schmidt and Nicole Schmidt, that she would trust to make medical decisions for her and she states their information should be on file.  I had this conversation 02/09/2021 approximately 12 PM     Subjective: This yesterday, gross hematuria noted overnight.  No fever, chills.  Still swollen, no change  Objective Vital signs were reviewed and unremarkable. General appearance: Obese adult female, sitting up in recliner, sleeping, easily arousable     HEENT: Nasal cannula in place Skin: Chronic venous stasis changes of both lower extremities Cardiac: RRR, no murmurs, JVP not visible Respiratory: Respiratory rate and rhythm Abdomen:   MSK:  Neuro:    Psych:  Attention normal, affect normal, judgment Syprine normal    Data Reviewed:  Review of labs and imaging studies is  notable for complete blood count with hemoglobin slightly down to 9.2 from 10, creatinine up to 5.2, potassium 5.2, white blood cell count up to 14 .  Discussed with nephrology and Urology.    Family Communication: Cousin by phone  Disposition: Status is: Inpatient  Remains inpatient appropriate because: She has severe renal failure, may need dialysis.  Her urine output has slowed again, her creatinine is unchanged.  At the moment, conservative therapy appears not to be leading to improvement in her renal function, I will defer to nephrology about timing of discharge or initiation of dialysis             Author: Edwin Dada, MD 02/20/2021 11:39 AM  For on call review [email or web address].

## 2021-02-20 NOTE — Assessment & Plan Note (Signed)
-   Continue Eliquis 

## 2021-02-20 NOTE — Assessment & Plan Note (Signed)
-   CPAP ordered nightly

## 2021-02-20 NOTE — Progress Notes (Signed)
[facility]  ROUNDING NOTE   Subjective:   Nicole Schmidt is a 56 year old female with a past medical history of anemia, hypertension, dyslipidemia, diabetes, CHF, and chronic kidney disease.  Patient presents to the hospital with complaints of shortness of breath and sore throat which started late last week.  Patient has been admitted for Viral URI [J06.9] Cardiorenal syndrome with renal failure [I13.10] Cardiorenal syndrome with renal failure, stage 1-4 or unspecified chronic kidney disease, with heart failure ([place]) [I13.0]  Patient is known to our practice and is followed by Dr. Holley Raring.  Patient was last seen in our office in November 2022.    Patient seen sitting up in chair Tolerating 2L [place], baseline Continues short of breath with activity  Creatinine 5.1 today  Objective:  Vital signs in last 24 hours:  Temp:  [97.5 F (36.4 C)-98.7 F (37.1 C)] 98.6 F (37 C) (01/08 1054) Pulse Rate:  [68-89] 85 (01/08 1054) Resp:  [17-20] 17 (01/08 1054) BP: (124-159)/(51-98) 129/51 (01/08 1054) SpO2:  [94 %-99 %] 95 % (01/08 1054)  Weight change:  Filed Weights   02/14/21 0500 02/18/21 0700 02/19/21 0500  Weight: (!) 161.2 kg (!) 161.6 kg (!) 162.5 kg    Intake/Output: I/O last 3 completed shifts: In: 85 [P.O.:610; I.V.:5] Out: 1200 [Urine:1200]   Intake/Output this shift:  Total I/O In: 240 [P.O.:240] Out: -   Physical Exam: General: NAD, Sitting in chair  Head: Normocephalic, atraumatic. Moist oral mucosal membranes  Eyes: Anicteric  Lungs:  Diminished in bases, normal effort, 2L O2, cough  Heart: Irregular rhythm  Abdomen:  Soft, nontender  Extremities:  trace peripheral edema.  Neurologic: Nonfocal, moving all four extremities       Basic Metabolic Panel: Recent Labs  Lab 02/16/21 0434 02/17/21 0742 02/18/21 0603 02/19/21 0439 02/20/21 0521  NA 137 142 140 142 141  K 5.4* 4.7 5.0 5.2* 5.2*  CL 99 102 104 104 106  CO2 24 25 24 23 25    GLUCOSE 174* 151* 139* 145* 135*  BUN 140* 141* 138* 123* 120*  CREATININE 5.63* 5.24* 4.99* 4.99* 5.12*  CALCIUM 9.7 10.0 9.9 9.9 9.9  PHOS  --  7.6* 8.3* 8.1* 7.9*     Liver Function Tests: Recent Labs  Lab 02/14/21 7106 02/15/21 0627 02/16/21 0434 02/17/21 0742 02/18/21 0603 02/19/21 0439 02/20/21 0521  AST 18 16 15   --   --   --   --   ALT 24 23 27   --   --   --   --   ALKPHOS 104 103 96  --   --   --   --   BILITOT 0.7 0.6 0.6  --   --   --   --   PROT 7.9 7.6 7.6  --   --   --   --   ALBUMIN 3.4* 3.2* 3.3* 3.2* 3.2* 3.1* 2.8*    No results for input(s): LIPASE, AMYLASE in the last 168 hours. No results for input(s): AMMONIA in the last 168 hours.  CBC: Recent Labs  Lab 02/14/21 0635 02/15/21 0627 02/16/21 0434 02/17/21 0742 02/20/21 0521  WBC 12.2* 11.4* 15.9* 14.7* 14.6*  HGB 10.7* 10.0* 9.5* 10.2* 9.2*  HCT 36.1 33.8* 31.6* 33.9* 30.8*  MCV 93.0 92.1 91.3 91.4 90.6  PLT 248 265 270 275 251     Cardiac Enzymes: No results for input(s): CKTOTAL, CKMB, CKMBINDEX, TROPONINI in the last 168 hours.  BNP: Invalid input(s): POCBNP  CBG:  Recent Labs  Lab 02/19/21 1129 02/19/21 1613 02/19/21 2042 02/20/21 0739 02/20/21 1053  GLUCAP 166* 179* 195* 141* 173*     Microbiology: Results for orders placed or performed during the hospital encounter of 02/09/21  Resp Panel by RT-PCR (Flu A&B, Covid) Throat     Status: None   Collection Time: 02/08/21 10:21 PM   Specimen: Throat; Nasopharyngeal(NP) swabs in vial transport medium  Result Value Ref Range Status   SARS Coronavirus 2 by RT PCR NEGATIVE NEGATIVE Final    Comment: (NOTE) SARS-CoV-2 target nucleic acids are NOT DETECTED.  The SARS-CoV-2 RNA is generally detectable in upper respiratory specimens during the acute phase of infection. The lowest concentration of SARS-CoV-2 viral copies this assay can detect is 138 copies/mL. A negative result does not preclude SARS-Cov-2 infection and should  not be used as the sole basis for treatment or other patient management decisions. A negative result may occur with  improper specimen collection/handling, submission of specimen other than nasopharyngeal swab, presence of viral mutation(s) within the areas targeted by this assay, and inadequate number of viral copies(<138 copies/mL). A negative result must be combined with clinical observations, patient history, and epidemiological information. The expected result is Negative.  Fact Sheet for Patients:  EntrepreneurPulse.com.au  Fact Sheet for Healthcare Providers:  IncredibleEmployment.be  This test is no t yet approved or cleared by the [place] FDA and  has been authorized for detection and/or diagnosis of SARS-CoV-2 by FDA under an Emergency Use Authorization (EUA). This EUA will remain  in effect (meaning this test can be used) for the duration of the COVID-19 declaration under Section 564(b)(1) of the Act, 21 U.S.C.section 360bbb-3(b)(1), unless the authorization is terminated  or revoked sooner.       Influenza A by PCR NEGATIVE NEGATIVE Final   Influenza B by PCR NEGATIVE NEGATIVE Final    Comment: (NOTE) The Xpert Xpress SARS-CoV-2/FLU/RSV plus assay is intended as an aid in the diagnosis of influenza from Nasopharyngeal swab specimens and should not be used as a sole basis for treatment. Nasal washings and aspirates are unacceptable for Xpert Xpress SARS-CoV-2/FLU/RSV testing.  Fact Sheet for Patients: EntrepreneurPulse.com.au  Fact Sheet for Healthcare Providers: IncredibleEmployment.be  This test is not yet approved or cleared by the [place] FDA and has been authorized for detection and/or diagnosis of SARS-CoV-2 by FDA under an Emergency Use Authorization (EUA). This EUA will remain in effect (meaning this test can be used) for the duration of the COVID-19 declaration under  Section 564(b)(1) of the Act, 21 U.S.C. section 360bbb-3(b)(1), unless the authorization is terminated or revoked.  Performed at [facility], [place], [place] [address]   Group A Strep by PCR [facility] Only)     Status: None   Collection Time: 02/08/21 10:21 PM   Specimen: Throat; Sterile Swab  Result Value Ref Range Status   Group A Strep by PCR NOT DETECTED NOT DETECTED Final    Comment: Performed at [facility], [address] [place], [place] [address]    Coagulation Studies: No results for input(s): LABPROT, INR in the last 72 hours.   Urinalysis: No results for input(s): COLORURINE, LABSPEC, PHURINE, GLUCOSEU, HGBUR, BILIRUBINUR, KETONESUR, PROTEINUR, UROBILINOGEN, NITRITE, LEUKOCYTESUR in the last 72 hours.  Invalid input(s): APPERANCEUR     Imaging: No results found.   Medications:    sodium chloride Stopped (02/14/21 1830)    amLODipine  10 mg Oral Daily   apixaban  5 mg Oral  BID   vitamin C  500 mg Oral BID   atorvastatin  20 mg Oral QHS   calcitRIOL  0.25 mcg Oral Daily   calcium carbonate  500 mg of elemental calcium Oral Daily   cloNIDine  0.2 mg Oral BID   folic acid  4,801 mcg Oral Daily   guaiFENesin  600 mg Oral BID   insulin aspart  0-15 Units Subcutaneous TID WC   insulin glargine-yfgn  15 Units Subcutaneous Daily   levothyroxine  112 mcg Oral QAC breakfast   melatonin  10 mg Oral QHS   metoprolol tartrate  100 mg Oral BID   nystatin   Topical BID   polyethylene glycol  17 g Oral Daily   sodium chloride flush  3 mL Intravenous Q12H   Vitamin D (Ergocalciferol)  50,000 Units Oral Q Fri   sodium chloride, acetaminophen, albuterol, ondansetron (ZOFRAN) IV, sodium chloride flush  Assessment/ Plan:  [name] is a 56 y.o.  female with medical problems of morbid obesity, anemia, hypertension, dyslipidemia, diabetes, CHF, and chronic kidney disease.  Patient presents to the hospital with complaints of  shortness of breath and sore throat which started late last week.  Patient has been admitted for Viral URI [J06.9] Cardiorenal syndrome with renal failure [I13.10] Cardiorenal syndrome with renal failure, stage 1-4 or unspecified chronic kidney disease, with heart failure ([place]) [I13.0]   Acute Kidney Injury on chronic kidney disease stage IV .  Patient has AKI secondary to ATN and possibly from obstructive uropathy from the left ureteropelvic kidney stone.  Baseline creatinine of 2.15/GFR 26 from December 30, 2020 Appreciate urology placement of stent on 02/15/20.   -Creatinine 5.1  -Discussed to need for close monitoring and possible need for renal replacement therapy.   -Will continue to monitor with am labs. Would consider discharge after am labs with close outpatient followup.   - No acute need for dialysis but monitoring closely  Lab Results  Component Value Date   CREATININE 5.12 (H) 02/20/2021   CREATININE 4.99 (H) 02/19/2021   CREATININE 4.99 (H) 02/18/2021    Intake/Output Summary (Last 24 hours) at 02/20/2021 1316 Last data filed at 02/20/2021 1017 Gross per 24 hour  Intake 490 ml  Output 450 ml  Net 40 ml    2. Anemia of chronic kidney disease Lab Results  Component Value Date   HGB 9.2 (L) 02/20/2021    Hemoglobin at goal  3. Secondary Hyperparathyroidism:  Lab Results  Component Value Date   PTH 94 (H) 11/18/2017   CALCIUM 9.9 02/20/2021   PHOS 7.9 (H) 02/20/2021  We will continue to monitor bone minerals during this admission  4.  Left kidney stone with hydronephrosis Appreciate urology placing left ureteral stent placed for left proximal stone.   LOS: [place] 1/8/20231:16 PM

## 2021-02-20 NOTE — Assessment & Plan Note (Signed)
Continue statin. 

## 2021-02-20 NOTE — Assessment & Plan Note (Addendum)
Glucose normal - Continue glargine - Continue sliding scale corrections -Hold Trulicity

## 2021-02-20 NOTE — Assessment & Plan Note (Signed)
Patient noted to have hydronephrosis on renal ultrasound on admission.  CT confirmed a stone.  Urology were consulted, placed the left ureteral stent.  Has had intermittent gross hematuria.  About 150 cc of blood today, discussed with Dr. Claudia Desanctis, Urology, this is within expected range, no intervention needed. - Monitor Hgb - Monitor hematuria -Follow-up with urology after discharge

## 2021-02-20 NOTE — Assessment & Plan Note (Addendum)
UOP was 2L for 2 days, but yesterday only 450 cc recorded out.  HyperK no change.  Still swollen.    Cr slightly up, BUN no change still 120  - Continue I/Os - Hold nephrotoxins - Consult nephrology, re: HD

## 2021-02-20 NOTE — Assessment & Plan Note (Signed)
Blood pressure controlled - Continue amlodipine, clonidine, metoprolol

## 2021-02-20 NOTE — Assessment & Plan Note (Signed)
Mild - Trend potassium

## 2021-02-20 NOTE — Assessment & Plan Note (Signed)
-   Hold furosemide

## 2021-02-20 NOTE — Assessment & Plan Note (Signed)
-   Continue apixaban - Continue metoprolol 

## 2021-02-20 NOTE — Assessment & Plan Note (Signed)
And anemia of CKD.  Hgb no change

## 2021-02-21 DIAGNOSIS — N132 Hydronephrosis with renal and ureteral calculous obstruction: Secondary | ICD-10-CM

## 2021-02-21 DIAGNOSIS — I10 Essential (primary) hypertension: Secondary | ICD-10-CM

## 2021-02-21 DIAGNOSIS — J189 Pneumonia, unspecified organism: Secondary | ICD-10-CM

## 2021-02-21 LAB — RENAL FUNCTION PANEL
Albumin: 2.9 g/dL — ABNORMAL LOW (ref 3.5–5.0)
Anion gap: 11 (ref 5–15)
BUN: 107 mg/dL — ABNORMAL HIGH (ref 6–20)
CO2: 24 mmol/L (ref 22–32)
Calcium: 9.9 mg/dL (ref 8.9–10.3)
Chloride: 108 mmol/L (ref 98–111)
Creatinine, Ser: 5.23 mg/dL — ABNORMAL HIGH (ref 0.44–1.00)
GFR, Estimated: 9 mL/min — ABNORMAL LOW
Glucose, Bld: 159 mg/dL — ABNORMAL HIGH (ref 70–99)
Phosphorus: 8.1 mg/dL — ABNORMAL HIGH (ref 2.5–4.6)
Potassium: 5 mmol/L (ref 3.5–5.1)
Sodium: 143 mmol/L (ref 135–145)

## 2021-02-21 LAB — GLUCOSE, CAPILLARY
Glucose-Capillary: 177 mg/dL — ABNORMAL HIGH (ref 70–99)
Glucose-Capillary: 175 mg/dL — ABNORMAL HIGH (ref 70–99)
Glucose-Capillary: 103 mg/dL — ABNORMAL HIGH (ref 70–99)

## 2021-02-21 LAB — CBC
HCT: 31.9 % — ABNORMAL LOW (ref 36.0–46.0)
Hemoglobin: 9.4 g/dL — ABNORMAL LOW (ref 12.0–15.0)
MCH: 27.2 pg (ref 26.0–34.0)
MCHC: 29.5 g/dL — ABNORMAL LOW (ref 30.0–36.0)
MCV: 92.2 fL (ref 80.0–100.0)
Platelets: 247 10*3/uL (ref 150–400)
RBC: 3.46 MIL/uL — ABNORMAL LOW (ref 3.87–5.11)
RDW: 15.7 % — ABNORMAL HIGH (ref 11.5–15.5)
WBC: 14.7 10*3/uL — ABNORMAL HIGH (ref 4.0–10.5)
nRBC: 0 % (ref 0.0–0.2)

## 2021-02-21 MED ORDER — EPOETIN ALFA 40000 UNIT/ML IJ SOLN
20000.0000 [IU] | Freq: Once | INTRAMUSCULAR | Status: AC
  Administered 2021-02-21: 20000 [IU] via SUBCUTANEOUS
  Filled 2021-02-21: qty 1

## 2021-02-21 MED ORDER — FUROSEMIDE 40 MG PO TABS
40.0000 mg | ORAL_TABLET | Freq: Once | ORAL | Status: AC
  Administered 2021-02-21: 40 mg via ORAL
  Filled 2021-02-21: qty 1

## 2021-02-21 NOTE — Progress Notes (Addendum)
[facility]  ROUNDING NOTE   Subjective:   Nicole Schmidt is a 56 year old female with a past medical history of anemia, hypertension, dyslipidemia, diabetes, CHF, and chronic kidney disease.  Patient presents to the hospital with complaints of shortness of breath and sore throat which started late last week.  Patient has been admitted for Viral URI [J06.9] Cardiorenal syndrome with renal failure [I13.10] Cardiorenal syndrome with renal failure, stage 1-4 or unspecified chronic kidney disease, with heart failure ([place]) [I13.0]  Patient is known to our practice and is followed by Dr. Holley Raring.  Patient was last seen in our office in November 2022.    Seated in recliner Tolerating meals Shortness of breath with exertion Lower extremity edema worsened  Creatinine 5.2  Objective:  Vital signs in last 24 hours:  Temp:  [97.5 F (36.4 C)-98.6 F (37 C)] 97.5 F (36.4 C) (01/09 0935) Pulse Rate:  [76-97] 76 (01/09 0935) Resp:  [16-20] 20 (01/09 0935) BP: (129-164)/(51-108) 151/108 (01/09 0935) SpO2:  [85 %-98 %] 93 % (01/09 0935) Weight:  [162 kg] 162 kg (01/09 0600)  Weight change:  Filed Weights   02/18/21 0700 02/19/21 0500 02/21/21 0600  Weight: (!) 161.6 kg (!) 162.5 kg (!) 162 kg    Intake/Output: I/O last 3 completed shifts: In: 730 [P.O.:730] Out: 450 [Urine:450]   Intake/Output this shift:  No intake/output data recorded.  Physical Exam: General: NAD, Sitting in chair  Head: Normocephalic, atraumatic. Moist oral mucosal membranes  Eyes: Anicteric  Lungs:  Diminished in bases, normal effort, 2L O2, cough  Heart: Irregular rhythm  Abdomen:  Soft, nontender  Extremities:  1+ peripheral edema.  Neurologic: Nonfocal, moving all four extremities       Basic Metabolic Panel: Recent Labs  Lab 02/17/21 0742 02/18/21 0603 02/19/21 0439 02/20/21 0521 02/21/21 0417  NA 142 140 142 141 143  K 4.7 5.0 5.2* 5.2* 5.0  CL 102 104 104 106 108  CO2 25 24  23 25 24   GLUCOSE 151* 139* 145* 135* 159*  BUN 141* 138* 123* 120* 107*  CREATININE 5.24* 4.99* 4.99* 5.12* 5.23*  CALCIUM 10.0 9.9 9.9 9.9 9.9  PHOS 7.6* 8.3* 8.1* 7.9* 8.1*     Liver Function Tests: Recent Labs  Lab 02/15/21 0627 02/16/21 0434 02/17/21 0742 02/18/21 0603 02/19/21 0439 02/20/21 0521 02/21/21 0417  AST 16 15  --   --   --   --   --   ALT 23 27  --   --   --   --   --   ALKPHOS 103 96  --   --   --   --   --   BILITOT 0.6 0.6  --   --   --   --   --   PROT 7.6 7.6  --   --   --   --   --   ALBUMIN 3.2* 3.3* 3.2* 3.2* 3.1* 2.8* 2.9*    No results for input(s): LIPASE, AMYLASE in the last 168 hours. No results for input(s): AMMONIA in the last 168 hours.  CBC: Recent Labs  Lab 02/15/21 0627 02/16/21 0434 02/17/21 0742 02/20/21 0521 02/21/21 0417  WBC 11.4* 15.9* 14.7* 14.6* 14.7*  HGB 10.0* 9.5* 10.2* 9.2* 9.4*  HCT 33.8* 31.6* 33.9* 30.8* 31.9*  MCV 92.1 91.3 91.4 90.6 92.2  PLT 265 270 275 251 247     Cardiac Enzymes: No results for input(s): CKTOTAL, CKMB, CKMBINDEX, TROPONINI in the last 168  hours.  BNP: Invalid input(s): POCBNP  CBG: Recent Labs  Lab 02/20/21 0739 02/20/21 1053 02/20/21 1531 02/20/21 2005 02/21/21 0939  GLUCAP 141* 173* 217* 186* 177*     Microbiology: Results for orders placed or performed during the hospital encounter of 02/09/21  Resp Panel by RT-PCR (Flu A&B, Covid) Throat     Status: None   Collection Time: 02/08/21 10:21 PM   Specimen: Throat; Nasopharyngeal(NP) swabs in vial transport medium  Result Value Ref Range Status   SARS Coronavirus 2 by RT PCR NEGATIVE NEGATIVE Final    Comment: (NOTE) SARS-CoV-2 target nucleic acids are NOT DETECTED.  The SARS-CoV-2 RNA is generally detectable in upper respiratory specimens during the acute phase of infection. The lowest concentration of SARS-CoV-2 viral copies this assay can detect is 138 copies/mL. A negative result does not preclude  SARS-Cov-2 infection and should not be used as the sole basis for treatment or other patient management decisions. A negative result may occur with  improper specimen collection/handling, submission of specimen other than nasopharyngeal swab, presence of viral mutation(s) within the areas targeted by this assay, and inadequate number of viral copies(<138 copies/mL). A negative result must be combined with clinical observations, patient history, and epidemiological information. The expected result is Negative.  Fact Sheet for Patients:  EntrepreneurPulse.com.au  Fact Sheet for Healthcare Providers:  IncredibleEmployment.be  This test is no t yet approved or cleared by the [place] FDA and  has been authorized for detection and/or diagnosis of SARS-CoV-2 by FDA under an Emergency Use Authorization (EUA). This EUA will remain  in effect (meaning this test can be used) for the duration of the COVID-19 declaration under Section 564(b)(1) of the Act, 21 U.S.C.section 360bbb-3(b)(1), unless the authorization is terminated  or revoked sooner.       Influenza A by PCR NEGATIVE NEGATIVE Final   Influenza B by PCR NEGATIVE NEGATIVE Final    Comment: (NOTE) The Xpert Xpress SARS-CoV-2/FLU/RSV plus assay is intended as an aid in the diagnosis of influenza from Nasopharyngeal swab specimens and should not be used as a sole basis for treatment. Nasal washings and aspirates are unacceptable for Xpert Xpress SARS-CoV-2/FLU/RSV testing.  Fact Sheet for Patients: EntrepreneurPulse.com.au  Fact Sheet for Healthcare Providers: IncredibleEmployment.be  This test is not yet approved or cleared by the [place] FDA and has been authorized for detection and/or diagnosis of SARS-CoV-2 by FDA under an Emergency Use Authorization (EUA). This EUA will remain in effect (meaning this test can be used) for the duration of  the COVID-19 declaration under Section 564(b)(1) of the Act, 21 U.S.C. section 360bbb-3(b)(1), unless the authorization is terminated or revoked.  Performed at [facility], [place], [place] [address]   Group A Strep by PCR [facility] Only)     Status: None   Collection Time: 02/08/21 10:21 PM   Specimen: Throat; Sterile Swab  Result Value Ref Range Status   Group A Strep by PCR NOT DETECTED NOT DETECTED Final    Comment: Performed at [facility], [address] [place], [place] [address]    Coagulation Studies: No results for input(s): LABPROT, INR in the last 72 hours.   Urinalysis: No results for input(s): COLORURINE, LABSPEC, PHURINE, GLUCOSEU, HGBUR, BILIRUBINUR, KETONESUR, PROTEINUR, UROBILINOGEN, NITRITE, LEUKOCYTESUR in the last 72 hours.  Invalid input(s): APPERANCEUR     Imaging: No results found.   Medications:    sodium chloride Stopped (02/14/21 1830)    amLODipine  10 mg Oral  Daily   apixaban  5 mg Oral BID   vitamin C  500 mg Oral BID   atorvastatin  20 mg Oral QHS   calcitRIOL  0.25 mcg Oral Daily   calcium carbonate  500 mg of elemental calcium Oral Daily   cloNIDine  0.2 mg Oral BID   folic acid  7,322 mcg Oral Daily   furosemide  40 mg Oral Once   insulin aspart  0-15 Units Subcutaneous TID WC   insulin glargine-yfgn  15 Units Subcutaneous Daily   levothyroxine  112 mcg Oral QAC breakfast   melatonin  10 mg Oral QHS   metoprolol tartrate  100 mg Oral BID   nystatin   Topical BID   polyethylene glycol  17 g Oral Daily   sodium chloride flush  3 mL Intravenous Q12H   Vitamin D (Ergocalciferol)  50,000 Units Oral Q Fri   sodium chloride, acetaminophen, albuterol, ondansetron (ZOFRAN) IV, sodium chloride flush  Assessment/ Plan:  Nicole Schmidt is a 55 y.o.  female with medical problems of morbid obesity, anemia, hypertension, dyslipidemia, diabetes, CHF, and chronic kidney disease.  Patient presents to the  hospital with complaints of shortness of breath and sore throat which started late last week.  Patient has been admitted for Viral URI [J06.9] Cardiorenal syndrome with renal failure [I13.10] Cardiorenal syndrome with renal failure, stage 1-4 or unspecified chronic kidney disease, with heart failure ([place]) [I13.0]   Acute Kidney Injury on chronic kidney disease stage IV .  Patient has AKI secondary to ATN and possibly from obstructive uropathy from the left ureteropelvic kidney stone.  Baseline creatinine of 2.15/GFR 26 from December 30, 2020 Appreciate urology placement of stent on 02/15/20.   -Creatinine continues to slowly rise, 5.2  -We explained to patient that recovery may be slow, but if not regained, she will need to begin renal replacement therapy. She is agreeable to dialysis when needed.   -Would prefer improved renal function prior to discharge, but patient continues have adequate urine output. If discharged, close outpatient follow up will be scheduled for labs within 2-3 days of discharge. This has all been discussed with patient.   - No acute need for dialysis but monitoring closely  - Will order Furosemide 40mg  po once for lower extremity edema  Lab Results  Component Value Date   CREATININE 5.23 (H) 02/21/2021   CREATININE 5.12 (H) 02/20/2021   CREATININE 4.99 (H) 02/19/2021    Intake/Output Summary (Last 24 hours) at 02/21/2021 0956 Last data filed at 02/21/2021 0600 Gross per 24 hour  Intake 730 ml  Output 250 ml  Net 480 ml    2. Anemia of chronic kidney disease Lab Results  Component Value Date   HGB 9.4 (L) 02/21/2021    Hgb at lower point of acceptable range. Will order EPO 20000 units Subq once today. Will need hematology follow up outpatient.   3. Secondary Hyperparathyroidism:  Lab Results  Component Value Date   PTH 94 (H) 11/18/2017   CALCIUM 9.9 02/21/2021   PHOS 8.1 (H) 02/21/2021  Calcium within acceptable range. Will continue to monitor phosphorus.    4.  Left kidney stone with hydronephrosis Appreciate urology placing left ureteral stent placed for left proximal stone. Intermittent bleeding reported during urination, expected from procedure   LOS: [place] 1/9/20239:56 AM

## 2021-02-21 NOTE — Progress Notes (Signed)
PROGRESS NOTE  Nicole Schmidt    DOB: 1966/02/04, 56 y.o.  LNL:[id number]  PCP: Lenard Simmer, MD   Code Status: Full Code   DOA: 02/09/2021   LOS: 12  Brief Narrative of Current Hospitalization  Nicole Schmidt is a 56 y.o. female with a PMH significant for HFpEF, hypothyroidism, anemia, arthritis, history of breast cancer, CKD 3, DM, HLD, HTN, gout, obesity, A. fib, history of PE. They presented from home to the ED on 02/09/2021 with sore throat, shortness of breath x a couple days. In the ED, it was found that they had acute on chronic respiratory failure, acute renal failure.  They were seen by nephrology and urology for ARF and obstructive ureteral stone.  Patient received left ureteral stent on 1/2.   Patient was admitted to medicine service for further workup and management of ARF and PNA as outlined in detail below.  02/21/21 -stable  Assessment & Plan  Principal Problem:   Acute renal failure superimposed on stage 4 chronic kidney disease (HCC) Active Problems:   History of pulmonary embolus (PE)   Iron deficiency anemia   Essential hypertension   Acute on chronic diastolic CHF (congestive heart failure) (HCC)   OSA on CPAP   Atrial fibrillation, chronic (HCC)   Dyslipidemia   Diabetes mellitus type 2 in obese (HCC)   Acquired hypothyroidism   Chronic respiratory failure with hypoxia (HCC)   Cardiorenal syndrome, stage 1-4 or unspecified chronic kidney disease, with heart failure (HCC)   Multifocal pneumonia   Hydronephrosis with renal and ureteral calculus obstruction   Hyperkalemia  ARF on Stage 4 chronic kidney disease- Cr stable with gradual improvement but mild regression today. 5.6>>>4.99>>5.23. baseline around 2.1. patient has mild pain in site of stent placement but continues to have good UOP with gross hematuria.  Left hydronephrosis 2/2 ureteral stone- s/p ureteral stent placement Jan 2nd. - nephrology following, appreciate recs  - no need for urgent  dialysis currently - urology following, appreciate recs  - OP stent removal on follow up - strict I/O - RFP daily - plan to follow patient Cr inpatient until seeing consistent improvement. Discussed with patient and we agree that we do not want to discharge when kidney function is showing a pattern of worsening. Can consider discharge with OP follow up Cr consistently improving.   Hyperkalemia- resolved. - monitoring and PRN replacement   Chronic respiratory failure with hypoxia (HCC)- 2L O2 requirement at baseline CAP- completed antibiotic course - breathing treatments PRN - mucinex   Acquired hypothyroidism - continue levothyroxine   Diabetes mellitus type 2 in obese ([place])- hgb A1c 7.2 on admission.  - sSSI - continue daily semglee 15units   HFpEF   Afib   Dyslipidemia   HTN- BP moderately well controlled.  - Continue statin, eliquis - home meds include amlodipine, metoprolol and clonidine.  - Titrating for better control. Once renal function improves, would recommend to PCP to consider alternate medications such as ARB with secondary benefits for heart/kidney protection.   History of pulmonary embolus (PE) - eliquis indefinitely   Iron deficiency anemia - Monitor CBC   OSA on CPAP - CPAP ordered nightly  DVT prophylaxis: eliquis apixaban (ELIQUIS) tablet 5 mg  Diet:  Diet Orders (From admission, onward)     Start     Ordered   02/14/21 1758  Diet heart healthy/carb modified Room service appropriate? Yes; Fluid consistency: Thin  Diet effective now       Question Answer Comment  Diet-HS Snack? Nothing   Room service appropriate? Yes   Fluid consistency: Thin      02/14/21 1757            Subjective 02/21/21    Nicole Schmidt reports no concerns today. She feels overall well minus the residual flank pain she has had over the weekend.   Disposition Plan & Communication  Patient status: Inpatient  Admitted From: Home Disposition: Home Anticipated discharge date:  TBD  Family Communication: none  Consults, Procedures, Significant Events  Consultants:  Nephrology Urology   Procedures/significant events:  Uretal stent placement 1/2  Antimicrobials:  Anti-infectives (From admission, onward)    Start     Dose/Rate Route Frequency Ordered Stop   02/11/21 1800  cefTRIAXone (ROCEPHIN) 2 g in sodium chloride 0.9 % 100 mL IVPB        2 g 200 mL/hr over 30 Minutes Intravenous Every 24 hours 02/11/21 1710 02/15/21 1810   02/11/21 1800  azithromycin (ZITHROMAX) tablet 500 mg        500 mg Oral Daily 02/11/21 1710 02/15/21 0900       Objective   Vitals:   02/20/21 1533 02/20/21 2126 02/21/21 0134 02/21/21 0338  BP: (!) 131/58 (!) 164/65 (!) 157/78 (!) 152/74  Pulse: 97 93 79 83  Resp: 17 18 16 18   Temp: 98.3 F (36.8 C) 97.8 F (36.6 C) 98.5 F (36.9 C) 98.3 F (36.8 C)  TempSrc:    Oral  SpO2: (!) 85% 98% 96% 94%  Weight:      Height:        Intake/Output Summary (Last 24 hours) at 02/21/2021 0607 Last data filed at 02/20/2021 1800 Gross per 24 hour  Intake 490 ml  Output 250 ml  Net 240 ml    Filed Weights   02/14/21 0500 02/18/21 0700 02/19/21 0500  Weight: (!) 161.2 kg (!) 161.6 kg (!) 162.5 kg    Patient BMI: Body mass index is 61.48 kg/m.   Physical Exam:  General: awake, alert, NAD Respiratory: normal respiratory effort. Cardiovascular: normal S1/S2, RRR, no JVD, murmurs, quick capillary refill  Nervous: A&O x3. no gross focal neurologic deficits, normal speech Skin: dry, intact, normal temperature, normal color. No rashes, lesions or ulcers on exposed skin Psychiatry: normal mood, congruent affect  Labs   I have personally reviewed following labs and imaging studies CBC    Component Value Date/Time   WBC 14.7 (H) 02/21/2021 0417   RBC 3.46 (L) 02/21/2021 0417   HGB 9.4 (L) 02/21/2021 0417   HGB 9.9 (L) 02/27/2020 0931   HCT 31.9 (L) 02/21/2021 0417   HCT 32.2 (L) 02/27/2020 0931   PLT 247 02/21/2021 0417   PLT  232 02/27/2020 0931   MCV 92.2 02/21/2021 0417   MCV 89 02/27/2020 0931   MCV 90 03/09/2014 1533   MCH 27.2 02/21/2021 0417   MCHC 29.5 (L) 02/21/2021 0417   RDW 15.7 (H) 02/21/2021 0417   RDW 13.0 02/27/2020 0931   RDW 15.2 (H) 03/09/2014 1533   LYMPHSABS 1.6 01/10/2021 1356   LYMPHSABS 1.5 03/09/2014 1533   MONOABS 0.9 01/10/2021 1356   MONOABS 0.6 03/09/2014 1533   EOSABS 0.3 01/10/2021 1356   EOSABS 0.2 03/09/2014 1533   BASOSABS 0.1 01/10/2021 1356   BASOSABS 0.1 03/09/2014 1533   BMP Latest Ref Rng & Units 02/21/2021 02/20/2021 02/19/2021  Glucose 70 - 99 mg/dL 159(H) 135(H) 145(H)  BUN 6 - 20 mg/dL 107(H) 120(H) 123(H)  Creatinine 0.44 - 1.00  mg/dL 5.23(H) 5.12(H) 4.99(H)  BUN/Creat Ratio 9 - 23 - - -  Sodium 135 - 145 mmol/L 143 141 142  Potassium 3.5 - 5.1 mmol/L 5.0 5.2(H) 5.2(H)  Chloride 98 - 111 mmol/L 108 106 104  CO2 22 - 32 mmol/L 24 25 23   Calcium 8.9 - 10.3 mg/dL 9.9 9.9 9.9   Imaging Studies  No results found.  Medications   Scheduled Meds:  amLODipine  10 mg Oral Daily   apixaban  5 mg Oral BID   vitamin C  500 mg Oral BID   atorvastatin  20 mg Oral QHS   calcitRIOL  0.25 mcg Oral Daily   calcium carbonate  500 mg of elemental calcium Oral Daily   cloNIDine  0.2 mg Oral BID   folic acid  3,016 mcg Oral Daily   insulin aspart  0-15 Units Subcutaneous TID WC   insulin glargine-yfgn  15 Units Subcutaneous Daily   levothyroxine  112 mcg Oral QAC breakfast   melatonin  10 mg Oral QHS   metoprolol tartrate  100 mg Oral BID   nystatin   Topical BID   polyethylene glycol  17 g Oral Daily   sodium chloride flush  3 mL Intravenous Q12H   Vitamin D (Ergocalciferol)  50,000 Units Oral Q Fri   No recently discontinued medications to reconcile  LOS: 12 days   Richarda Osmond, DO [facility] 02/21/2021, 6:07 AM   Available by Epic secure chat 7AM-7PM. If 7PM-7AM, please contact night-coverage Refer to [email or web address] to contact the [facility] Attending or  Consulting provider for this Nicole Schmidt

## 2021-02-21 NOTE — TOC Initial Note (Signed)
Transition of Care [facility]) - Initial/Assessment Note    Patient Details  Name: Nicole Schmidt MRN: [id number] Date of Birth: [birth year]  Transition of Care [facility]) CM/SW Contact:    Conception Oms, RN Phone Number: 02/21/2021, 8:43 AM  Clinical Narrative:        Transition of Care [facility]) Screening Note   Patient Details  Name: Nicole Schmidt Date of Birth: 1965/09/14   Transition of Care [facility]) CM/SW Contact:    Conception Oms, RN Phone Number: 02/21/2021, 8:44 AM    Transition of Care Department [facility]) has reviewed patient and no TOC needs have been identified at this time. We will continue to monitor patient advancement through interdisciplinary progression rounds. If new patient transition needs arise, please place a TOC consult.                   Patient Goals and CMS Choice        Expected Discharge Plan and Services                                                Prior Living Arrangements/Services                       Activities of Daily Living Home Assistive Devices/Equipment: Oxygen ADL Screening (condition at time of admission) Patient's cognitive ability adequate to safely complete daily activities?: Yes Is the patient deaf or have difficulty hearing?: No Does the patient have difficulty seeing, even when wearing glasses/contacts?: No Does the patient have difficulty concentrating, remembering, or making decisions?: No Patient able to express need for assistance with ADLs?: Yes Does the patient have difficulty dressing or bathing?: No Independently performs ADLs?: Yes (appropriate for developmental age) Does the patient have difficulty walking or climbing stairs?: Yes (stairs) Weakness of Legs: None Weakness of Arms/Hands: None  Permission Sought/Granted                  Emotional Assessment              Admission diagnosis:  Viral URI [J06.9] Cardiorenal syndrome with renal failure [I13.10] Cardiorenal syndrome  with renal failure, stage 1-4 or unspecified chronic kidney disease, with heart failure ([place]) [I13.0] Patient Active Problem List   Diagnosis Date Noted   Multifocal pneumonia 02/19/2021   Hydronephrosis with renal and ureteral calculus obstruction 02/19/2021   Hyperkalemia 02/19/2021   Cardiorenal syndrome, stage 1-4 or unspecified chronic kidney disease, with heart failure ([place]) 02/09/2021    Class: Hospitalized for   AKI (acute kidney injury) ([place]) 02/09/2021   Bronchitis 02/09/2021   Cardiorenal syndrome with renal failure 02/09/2021   Chronic respiratory failure with hypoxia ([place]) 01/13/2021   Physical deconditioning 03/16/2020   Abnormal findings on diagnostic imaging of lung 03/16/2020   AF (paroxysmal atrial fibrillation) ([place])    Acquired thrombophilia ([place])    Acute on chronic respiratory failure with hypoxia ([place]) 02/21/2020   Acute on chronic respiratory failure with hypoxemia ([place]) 02/21/2020   Atrial fibrillation, chronic ([place]) 02/09/2020   Dyslipidemia 02/09/2020   Diabetes mellitus type 2 in obese ([place]) 02/09/2020   Acquired hypothyroidism 02/09/2020   Thrush, oral 02/09/2020   Morbid obesity with BMI of 60.0-69.9, adult ([place]) 02/03/2020   Chronic anticoagulation 12/30/2019   OSA on CPAP 08/04/2019   Atrial fibrillation with RVR ([place]) 10/18/2018  Acute hypoxemic respiratory failure (HCC) 03/29/2018   Acute on chronic diastolic CHF (congestive heart failure) ([place]) 02/04/2018   Lymphedema 02/04/2018   Essential hypertension 12/09/2017   Long term (current) use of aromatase inhibitors 11/17/2017   Elevated alkaline phosphatase level 05/20/2017   Goals of care, counseling/discussion 05/14/2017   Chronic renal disease, stage III ([place]) 07/08/2015   Iron deficiency anemia 07/08/2015   Acute renal failure superimposed on stage 4 chronic kidney disease ([place]) 10/22/2014   History of breast cancer 10/22/2014   Elevated troponin 10/22/2014   Pulmonary emboli ([place])  10/18/2014   History of pulmonary embolus (PE) 10/18/2014   Candidiasis of breast 07/06/2014   Malignant neoplasm of right female breast ([place]) 07/24/2012   PCP:  Lenard Simmer, MD Pharmacy:   [facility] [address], [place] - [place] [address] Phone: [phone] Fax: [phone]  [facility] - [address], [place] [address] Phone: [phone] Fax: [phone]  [facility] [place], [place] [address] [place] [address] Phone: [phone] Fax: [phone]     Social Determinants of Health ([place]) Interventions    Readmission Risk Interventions Readmission Risk Prevention Plan 02/11/2020 10/19/2018  Transportation Screening Complete Complete  PCP or Specialist Appt within 3-5 Days Not Complete -  Not Complete comments To be scheduled when discharged. -  [place] or Home Care Consult Complete -  Social Work Consult for [place] Planning/Counseling Complete Patient refused  Palliative Care Screening Complete Not Applicable  Medication Review Press photographer) Complete Complete  Some recent data might be hidden

## 2021-02-22 DIAGNOSIS — E875 Hyperkalemia: Secondary | ICD-10-CM

## 2021-02-22 DIAGNOSIS — J069 Acute upper respiratory infection, unspecified: Secondary | ICD-10-CM

## 2021-02-22 LAB — BASIC METABOLIC PANEL
Anion gap: 12 (ref 5–15)
BUN: 145 mg/dL — ABNORMAL HIGH (ref 6–20)
CO2: 23 mmol/L (ref 22–32)
Calcium: 9.5 mg/dL (ref 8.9–10.3)
Chloride: 104 mmol/L (ref 98–111)
Creatinine, Ser: 5.24 mg/dL — ABNORMAL HIGH (ref 0.44–1.00)
GFR, Estimated: 9 mL/min — ABNORMAL LOW (ref >60)
Glucose, Bld: 143 mg/dL — ABNORMAL HIGH (ref 70–99)
Potassium: 4.9 mmol/L (ref 3.5–5.1)
Sodium: 139 mmol/L (ref 135–145)

## 2021-02-22 LAB — GLUCOSE, CAPILLARY
Glucose-Capillary: 131 mg/dL — ABNORMAL HIGH (ref 70–99)
Glucose-Capillary: 175 mg/dL — ABNORMAL HIGH (ref 70–99)

## 2021-02-22 MED ORDER — METOPROLOL TARTRATE 100 MG PO TABS: 100.0000 mg | ORAL_TABLET | Freq: Two times a day (BID) | ORAL | 0 refills | Status: DC

## 2021-02-22 NOTE — Progress Notes (Addendum)
[facility]  ROUNDING NOTE   Subjective:   Nicole Schmidt is a 56 year old female with a past medical history of anemia, hypertension, dyslipidemia, diabetes, CHF, and chronic kidney disease.  Patient presents to the hospital with complaints of shortness of breath and sore throat which started late last week.  Patient has been admitted for Viral URI [J06.9] Cardiorenal syndrome with renal failure [I13.10] Cardiorenal syndrome with renal failure, stage 1-4 or unspecified chronic kidney disease, with heart failure ([place]) [I13.0]  Patient is known to our practice and is followed by Dr. Holley Raring.  Patient was last seen in our office in November 2022.    Patient resting comfortably in chair Tolerating small meals, states appetite remains low Denies distaste of food and drink Denies shortness of breath, improved dyspnea on exertion.   Creatinine 5.2  Objective:  Vital signs in last 24 hours:  Temp:  [97.4 F (36.3 C)-98.1 F (36.7 C)] 97.8 F (36.6 C) (01/10 0849) Pulse Rate:  [75-93] 82 (01/10 0849) Resp:  [18-21] 18 (01/10 0849) BP: (108-154)/(60-72) 148/61 (01/10 0849) SpO2:  [94 %-97 %] 97 % (01/10 0849)  Weight change:  Filed Weights   02/18/21 0700 02/19/21 0500 02/21/21 0600  Weight: (!) 161.6 kg (!) 162.5 kg (!) 162 kg    Intake/Output: I/O last 3 completed shifts: In: 360 [P.O.:360] Out: -    Intake/Output this shift:  Total I/O In: 240 [P.O.:240] Out: 400 [Urine:400]  Physical Exam: General: NAD, Sitting in chair  Head: Normocephalic, atraumatic. Moist oral mucosal membranes  Eyes: Anicteric  Lungs:  Diminished in bases, normal effort, 2L O2, cough  Heart: Irregular rhythm  Abdomen:  Soft, nontender  Extremities:  1+ peripheral edema.  Neurologic: Nonfocal, moving all four extremities       Basic Metabolic Panel: Recent Labs  Lab 02/17/21 0742 02/18/21 0603 02/19/21 0439 02/20/21 0521 02/21/21 0417 02/22/21 0445  NA 142 140 142 141 143  139  K 4.7 5.0 5.2* 5.2* 5.0 4.9  CL 102 104 104 106 108 104  CO2 25 24 23 25 24 23   GLUCOSE 151* 139* 145* 135* 159* 143*  BUN 141* 138* 123* 120* 107* 145*  CREATININE 5.24* 4.99* 4.99* 5.12* 5.23* 5.24*  CALCIUM 10.0 9.9 9.9 9.9 9.9 9.5  PHOS 7.6* 8.3* 8.1* 7.9* 8.1*  --      Liver Function Tests: Recent Labs  Lab 02/16/21 0434 02/17/21 0742 02/18/21 0603 02/19/21 0439 02/20/21 0521 02/21/21 0417  AST 15  --   --   --   --   --   ALT 27  --   --   --   --   --   ALKPHOS 96  --   --   --   --   --   BILITOT 0.6  --   --   --   --   --   PROT 7.6  --   --   --   --   --   ALBUMIN 3.3* 3.2* 3.2* 3.1* 2.8* 2.9*    No results for input(s): LIPASE, AMYLASE in the last 168 hours. No results for input(s): AMMONIA in the last 168 hours.  CBC: Recent Labs  Lab 02/16/21 0434 02/17/21 0742 02/20/21 0521 02/21/21 0417  WBC 15.9* 14.7* 14.6* 14.7*  HGB 9.5* 10.2* 9.2* 9.4*  HCT 31.6* 33.9* 30.8* 31.9*  MCV 91.3 91.4 90.6 92.2  PLT 270 275 251 247     Cardiac Enzymes: No results for input(s): CKTOTAL,  CKMB, CKMBINDEX, TROPONINI in the last 168 hours.  BNP: Invalid input(s): POCBNP  CBG: Recent Labs  Lab 02/20/21 2005 02/21/21 0939 02/21/21 1159 02/21/21 1819 02/22/21 0825  GLUCAP 186* 177* 175* 103* 131*     Microbiology: Results for orders placed or performed during the hospital encounter of 02/09/21  Resp Panel by RT-PCR (Flu A&B, Covid) Throat     Status: None   Collection Time: 02/08/21 10:21 PM   Specimen: Throat; Nasopharyngeal(NP) swabs in vial transport medium  Result Value Ref Range Status   SARS Coronavirus 2 by RT PCR NEGATIVE NEGATIVE Final    Comment: (NOTE) SARS-CoV-2 target nucleic acids are NOT DETECTED.  The SARS-CoV-2 RNA is generally detectable in upper respiratory specimens during the acute phase of infection. The lowest concentration of SARS-CoV-2 viral copies this assay can detect is 138 copies/mL. A negative result does not  preclude SARS-Cov-2 infection and should not be used as the sole basis for treatment or other patient management decisions. A negative result may occur with  improper specimen collection/handling, submission of specimen other than nasopharyngeal swab, presence of viral mutation(s) within the areas targeted by this assay, and inadequate number of viral copies(<138 copies/mL). A negative result must be combined with clinical observations, patient history, and epidemiological information. The expected result is Negative.  Fact Sheet for Patients:  EntrepreneurPulse.com.au  Fact Sheet for Healthcare Providers:  IncredibleEmployment.be  This test is no t yet approved or cleared by the [place] FDA and  has been authorized for detection and/or diagnosis of SARS-CoV-2 by FDA under an Emergency Use Authorization (EUA). This EUA will remain  in effect (meaning this test can be used) for the duration of the COVID-19 declaration under Section 564(b)(1) of the Act, 21 U.S.C.section 360bbb-3(b)(1), unless the authorization is terminated  or revoked sooner.       Influenza A by PCR NEGATIVE NEGATIVE Final   Influenza B by PCR NEGATIVE NEGATIVE Final    Comment: (NOTE) The Xpert Xpress SARS-CoV-2/FLU/RSV plus assay is intended as an aid in the diagnosis of influenza from Nasopharyngeal swab specimens and should not be used as a sole basis for treatment. Nasal washings and aspirates are unacceptable for Xpert Xpress SARS-CoV-2/FLU/RSV testing.  Fact Sheet for Patients: EntrepreneurPulse.com.au  Fact Sheet for Healthcare Providers: IncredibleEmployment.be  This test is not yet approved or cleared by the [place] FDA and has been authorized for detection and/or diagnosis of SARS-CoV-2 by FDA under an Emergency Use Authorization (EUA). This EUA will remain in effect (meaning this test can be used) for the  duration of the COVID-19 declaration under Section 564(b)(1) of the Act, 21 U.S.C. section 360bbb-3(b)(1), unless the authorization is terminated or revoked.  Performed at [facility], [place], [place] [address]   Group A Strep by PCR [facility] Only)     Status: None   Collection Time: 02/08/21 10:21 PM   Specimen: Throat; Sterile Swab  Result Value Ref Range Status   Group A Strep by PCR NOT DETECTED NOT DETECTED Final    Comment: Performed at [facility], [address] [place], [place] [address]    Coagulation Studies: No results for input(s): LABPROT, INR in the last 72 hours.   Urinalysis: No results for input(s): COLORURINE, LABSPEC, PHURINE, GLUCOSEU, HGBUR, BILIRUBINUR, KETONESUR, PROTEINUR, UROBILINOGEN, NITRITE, LEUKOCYTESUR in the last 72 hours.  Invalid input(s): APPERANCEUR     Imaging: No results found.   Medications:    sodium chloride Stopped (02/14/21 1830)  amLODipine  10 mg Oral Daily   apixaban  5 mg Oral BID   vitamin C  500 mg Oral BID   atorvastatin  20 mg Oral QHS   calcitRIOL  0.25 mcg Oral Daily   calcium carbonate  500 mg of elemental calcium Oral Daily   cloNIDine  0.2 mg Oral BID   folic acid  3,846 mcg Oral Daily   insulin aspart  0-15 Units Subcutaneous TID WC   insulin glargine-yfgn  15 Units Subcutaneous Daily   levothyroxine  112 mcg Oral QAC breakfast   melatonin  10 mg Oral QHS   metoprolol tartrate  100 mg Oral BID   nystatin   Topical BID   polyethylene glycol  17 g Oral Daily   sodium chloride flush  3 mL Intravenous Q12H   Vitamin D (Ergocalciferol)  50,000 Units Oral Q Fri   sodium chloride, acetaminophen, albuterol, ondansetron (ZOFRAN) IV, sodium chloride flush  Assessment/ Plan:  Ms. MISHTI SWANTON is a 56 y.o.  female with medical problems of morbid obesity, anemia, hypertension, dyslipidemia, diabetes, CHF, and chronic kidney disease.  Patient presents to the hospital with  complaints of shortness of breath and sore throat which started late last week.  Patient has been admitted for Viral URI [J06.9] Cardiorenal syndrome with renal failure [I13.10] Cardiorenal syndrome with renal failure, stage 1-4 or unspecified chronic kidney disease, with heart failure ([place]) [I13.0]   Acute Kidney Injury on chronic kidney disease stage IV .  Patient has AKI secondary to ATN and possibly from obstructive uropathy from the left ureteropelvic kidney stone.  Baseline creatinine of 2.15/GFR 26 from December 30, 2020 Appreciate urology placement of stent on 02/15/20.   -Creatinine unchanged today, no urine output recorded.   -Discussed with primary team that we were ok with discharge and close outpatient follow up. Primary team would prefer improved renal function prior to discharge.   -Will continue to monitor.   -Requesting strict I&O   - No acute need for dialysis at this time.     Lab Results  Component Value Date   CREATININE 5.24 (H) 02/22/2021   CREATININE 5.23 (H) 02/21/2021   CREATININE 5.12 (H) 02/20/2021    Intake/Output Summary (Last 24 hours) at 02/22/2021 1023 Last data filed at 02/22/2021 1001 Gross per 24 hour  Intake 360 ml  Output 400 ml  Net -40 ml    2. Anemia of chronic kidney disease Lab Results  Component Value Date   HGB 9.4 (L) 02/21/2021    Hgb within target range. EPO subq given on 02/22/20. Will need hematology follow up outpatient.   3. Secondary Hyperparathyroidism:  Lab Results  Component Value Date   PTH 94 (H) 11/18/2017   CALCIUM 9.5 02/22/2021   PHOS 8.1 (H) 02/21/2021  Monitoring bone minerals during this admission.   4.  Left kidney stone with hydronephrosis Appreciate urology placing left ureteral stent placed for left proximal stone.   No reports of bleeding with urination.    LOS: [place] 1/10/202310:23 AM

## 2021-02-22 NOTE — Plan of Care (Signed)
Pt is ready for discharge.

## 2021-02-22 NOTE — Discharge Summary (Signed)
Physician Discharge Summary  Nicole Schmidt KTG:[id number] DOB: [birth year] DOA: 02/09/2021  PCP: Lenard Simmer, MD  Admit date: 02/09/2021 Discharge date: 02/22/2021  Admitted From: Home Disposition: Home  Recommendations for Outpatient Follow-up:  Follow up with PCP within 1-2 weeks to monitor blood pressure control as patient's medications were changed due to kidney function Follow up with nephrology in 1 week to monitor kidney function Follow-up with urology for ureter stent monitoring and removal  Discharge Condition: Stable, improved CODE STATUS:  Code Status: Full Code  Renal diet  Brief/Interim Summary: Pt initially presented to hospital due to shortness of breath and sore throat. She had acute on chronic respiratory distress requiring increased oxygen support over her baseline of 2L O2. She was treated for CAP and able to wean back to her home 2L oxygen requirement.  Additionally, patient was found to have ARF in setting of obstructive renal stone. Urology was consulted and placed a uretal stent which resolved the obstruction. Nephrology was consulted to monitor her kidney function which had some modest improvements but was not returning to baseline. Due to having good UOP and stable electrolytes, she did not have a need for emergent dialysis during this admission. Nephrology recommended continued close monitoring on an outpatient basis.   Her other chronic conditions were managed with her home therapies and remained stable.   Discharge Diagnoses:  Principal Problem:   Acute renal failure superimposed on stage 4 chronic kidney disease (HCC) Active Problems:   History of pulmonary embolus (PE)   Iron deficiency anemia   Essential hypertension   Acute on chronic diastolic CHF (congestive heart failure) (HCC)   OSA on CPAP   Atrial fibrillation, chronic (HCC)   Dyslipidemia   Diabetes mellitus type 2 in obese [facility])   Acquired hypothyroidism   Chronic respiratory failure  with hypoxia (HCC)   Cardiorenal syndrome, stage 1-4 or unspecified chronic kidney disease, with heart failure (HCC)   Multifocal pneumonia   Hydronephrosis with renal and ureteral calculus obstruction   Hyperkalemia  Allergies as of 02/22/2021   No Known Allergies      Medication List     STOP taking these medications    CINNAMON PO   CRANBERRY CONCENTRATE PO   furosemide 40 MG tablet Commonly known as: LASIX   Klor-Con M20 20 MEQ tablet Generic drug: potassium chloride SA   vitamin C 500 MG tablet Commonly known as: ASCORBIC ACID       TAKE these medications    acetaminophen 500 MG tablet Commonly known as: TYLENOL Take 1,000 mg by mouth every 6 (six) hours as needed (for pain).   albuterol 108 (90 Base) MCG/ACT inhaler Commonly known as: VENTOLIN HFA Inhale 2 puffs into the lungs every 6 (six) hours as needed for wheezing or shortness of breath.   amLODipine 5 MG tablet Commonly known as: NORVASC Take 5 mg by mouth 2 (two) times daily.   atorvastatin 20 MG tablet Commonly known as: LIPITOR Take 20 mg by mouth at bedtime.   calcitRIOL 0.25 MCG capsule Commonly known as: ROCALTROL Take 0.25 mcg by mouth daily.   calcium carbonate 1500 (600 Ca) MG Tabs tablet Commonly known as: OSCAL Take 600 mg of elemental calcium by mouth daily.   cloNIDine 0.2 MG tablet Commonly known as: CATAPRES Take 1 tablet (0.2 mg total) by mouth 2 (two) times daily.   Dulaglutide 1.5 MG/0.5ML Sopn Inject 1.5 mg into the skin every 7 (seven) days.   Eliquis 5 MG Tabs tablet  Generic drug: apixaban TAKE 1 TABLET TWICE A DAY   folic acid 604 MCG tablet Commonly known as: FOLVITE Take 800 mcg by mouth daily.   levothyroxine 112 MCG tablet Commonly known as: SYNTHROID Take 112 mcg daily before breakfast by mouth.   Melatonin 10 MG Tabs Take 10 mg by mouth at bedtime.   metoprolol tartrate 100 MG tablet Commonly known as: LOPRESSOR Take 1 tablet (100 mg total) by  mouth 2 (two) times daily. What changed:  medication strength how much to take   Vitamin D3 1.25 MG (50000 UT) Tabs Take 50,000 Units by mouth every Friday.        No Known Allergies  Consultations: Urology Nephrology   Procedures/Studies: CT ABDOMEN PELVIS WO CONTRAST  Result Date: 02/12/2021 CLINICAL DATA:  Hydronephrosis. EXAM: CT ABDOMEN AND PELVIS WITHOUT CONTRAST TECHNIQUE: Multidetector CT imaging of the abdomen and pelvis was performed following the standard protocol without IV contrast. COMPARISON:  February 09, 2021.  February 09, 2020. FINDINGS: Lower chest: Bibasilar subsegmental atelectasis is noted. Hepatobiliary: No focal liver abnormality is seen. No gallstones, gallbladder wall thickening, or biliary dilatation. Pancreas: Unremarkable. No pancreatic ductal dilatation or surrounding inflammatory changes. Spleen: Normal in size without focal abnormality. Adrenals/Urinary Tract: Stable 2.5 cm left adrenal nodule is noted. Right adrenal gland appears normal. Mild left hydronephrosis is noted secondary to 7 mm calculus at left ureteropelvic junction. 1.9 cm hyperdense rounded abnormality is seen involving upper pole of right kidney which most likely corresponds to cyst described on recent ultrasound. Urinary bladder is decompressed. Stomach/Bowel: Stomach is within normal limits. Appendix appears normal. No evidence of bowel wall thickening, distention, or inflammatory changes. Vascular/Lymphatic: Aortic atherosclerosis. No enlarged abdominal or pelvic lymph nodes. Reproductive: Uterus and bilateral adnexa are unremarkable. Other: No abdominal wall hernia or abnormality. No abdominopelvic ascites. Musculoskeletal: No acute or significant osseous findings. IMPRESSION: Mild left hydronephrosis is noted secondary to 7 mm calculus at the left ureteropelvic junction. Stable 2.5 cm left adrenal nodule is noted. Follow-up CT or MRI in 1 year is recommended to ensure stability. Aortic  Atherosclerosis (ICD10-I70.0). Electronically Signed   By: Marijo Conception M.D.   On: 02/12/2021 16:40   DG Chest 2 View  Result Date: 02/08/2021 CLINICAL DATA:  Shortness of breath. Sneezing and scratchy throat since Friday. History of pneumonia. EXAM: CHEST - 2 VIEW COMPARISON:  03/16/2020 FINDINGS: Mild cardiac enlargement. Shallow inspiration. Developing linear infiltration in both mid and lower lung zones. Consider multifocal pneumonia versus atelectasis. Edema less likely to have this appearance. No pleural effusions. No pneumothorax. Mediastinal contours appear intact. Calcification of the aorta. Degenerative changes in the spine and shoulders. IMPRESSION: Developing infiltrates in both mid and lower lung zones may represent pneumonia versus atelectasis. Electronically Signed   By: Lucienne Capers M.D.   On: 02/08/2021 23:07   US RENAL  Result Date: 02/09/2021 CLINICAL DATA:  A 56 year old female presents for evaluation of renal failure EXAM: RENAL / URINARY TRACT ULTRASOUND COMPLETE COMPARISON:  Renal sonogram from 2019. FINDINGS: Right Kidney: Renal measurements: 11.0 x 6.0 x 5.0 cm = volume: 170 mL. Marked increased echogenicity. No hydronephrosis. Limited assessment due to patient body habitus. Small cyst in the RIGHT kidney measuring 1.5 x 1.2 x 1.2 cm. Left Kidney: Renal measurements: 13.5 x 6.2 x 5.2 cm = volume: 227 mL. Increased cortical echogenicity on the LEFT as well. Dilation of the LEFT renal pelvis and infundibula. Limited assessment due to patient body habitus. Trace perinephric fluid on the LEFT.  Bladder: Appears normal for degree of bladder distention. Other: No ascites. IMPRESSION: Echogenic renal parenchyma, this could be seen in the setting of medical renal disease. Trace perinephric fluid on the LEFT could also be seen in this context. Mild LEFT hydronephrosis with dilation of renal pelvis and infundibular dilation, difficult to fully evaluate due to patient body habitus.  Correlate with any LEFT-sided symptoms and with CT imaging as warranted. Electronically Signed   By: Zetta Bills M.D.   On: 02/09/2021 13:27   DG OR UROLOGY CYSTO IMAGE ([place])  Result Date: 02/14/2021 There is no interpretation for this exam.  This order is for images obtained during a surgical procedure.  Please See "Surgeries" Tab for more information regarding the procedure.   ECHOCARDIOGRAM COMPLETE  Result Date: 02/10/2021    ECHOCARDIOGRAM REPORT   Patient Name:   Nicole Schmidt Date of Exam: 02/09/2021 Medical Rec #:  [id number]      Height:       64.0 in Accession #:    [id number]     Weight:       351.0 lb Date of Birth:  1965-07-20       BSA:          2.485 m Patient Age:    56 years       BP:           136/71 mmHg Patient Gender: F              HR:           84 bpm. Exam Location:  [facility] Procedure: 2D Echo, Cardiac Doppler, Color Doppler and Intracardiac            Opacification Agent Indications:     W10.27 Acute Diastolic CHF  History:         Patient has prior history of Echocardiogram examinations, most                  recent 02/09/2020. CHF, Arrythmias:Atrial Fibrillation; Risk                  Factors:Hypertension, Diabetes, Dyslipidemia and Morbid                  Obesity. Hypothyroidism. Chronic kidney disease. Pulmonary                  embolism.  Sonographer:     Cresenciano Lick RDCS Referring Phys:  [id number] Emeterio Reeve Diagnosing Phys: Nelva Bush MD IMPRESSIONS  1. Left ventricular ejection fraction, by estimation, is 60 to 65%. The left ventricle has normal function. Left ventricular endocardial border not optimally defined to evaluate regional wall motion. There is moderate left ventricular hypertrophy. Left ventricular diastolic function could not be evaluated.  2. Right ventricular systolic function is normal. The right ventricular size is mildly enlarged.  3. Left atrial size was moderately dilated.  4. Right atrial size was moderately dilated.  5. The  mitral valve is normal in structure. Trivial mitral valve regurgitation.  6. The aortic valve has an indeterminant number of cusps. Aortic valve regurgitation is not visualized. No aortic stenosis is present.  7. The inferior vena cava is normal in size with greater than 50% respiratory variability, suggesting right atrial pressure of 3 mmHg. FINDINGS  Left Ventricle: Left ventricular ejection fraction, by estimation, is 60 to 65%. The left ventricle has normal function. Left ventricular endocardial border not optimally defined to evaluate regional wall motion. Definity contrast agent was given  IV to delineate the left ventricular endocardial borders. The left ventricular internal cavity size was normal in size. There is moderate left ventricular hypertrophy. Left ventricular diastolic function could not be evaluated due to atrial fibrillation. Left ventricular diastolic function could not be evaluated. Right Ventricle: The right ventricular size is mildly enlarged. No increase in right ventricular wall thickness. Right ventricular systolic function is normal. Left Atrium: Left atrial size was moderately dilated. Right Atrium: Right atrial size was moderately dilated. Pericardium: Trivial pericardial effusion is present. Mitral Valve: The mitral valve is normal in structure. Trivial mitral valve regurgitation. Tricuspid Valve: The tricuspid valve is normal in structure. Tricuspid valve regurgitation is trivial. Aortic Valve: The aortic valve has an indeterminant number of cusps. Aortic valve regurgitation is not visualized. No aortic stenosis is present. Pulmonic Valve: The pulmonic valve was not well visualized. Pulmonic valve regurgitation is not visualized. No evidence of pulmonic stenosis. Aorta: The aortic root is normal in size and structure. Pulmonary Artery: The pulmonary artery is not well seen. Venous: The inferior vena cava is normal in size with greater than 50% respiratory variability, suggesting right  atrial pressure of 3 mmHg. IAS/Shunts: The interatrial septum was not well visualized.  LEFT VENTRICLE PLAX 2D LVIDd:         4.80 cm LVIDs:         3.00 cm LV PW:         1.50 cm LV IVS:        1.50 cm LVOT diam:     2.20 cm LV SV:         75 LV SV Index:   30 LVOT Area:     3.80 cm  RIGHT VENTRICLE             IVC RV Basal diam:  4.40 cm     IVC diam: 1.50 cm RV S prime:     12.80 cm/s TAPSE (M-mode): 2.4 cm LEFT ATRIUM           Index        RIGHT ATRIUM           Index LA diam:      5.20 cm 2.09 cm/m   RA Area:     22.50 cm LA Vol (A4C): 75.1 ml 30.23 ml/m  RA Volume:   71.80 ml  28.90 ml/m  AORTIC VALVE LVOT Vmax:   102.95 cm/s LVOT Vmean:  77.850 cm/s LVOT VTI:    0.196 m  AORTA Ao Root diam: 3.00 cm MV E velocity: 123.67 cm/s                             SHUNTS                             Systemic VTI:  0.20 m                             Systemic Diam: 2.20 cm Nelva Bush MD Electronically signed by Nelva Bush MD Signature Date/Time: 02/10/2021/7:15:21 AM    Final     Subjective: Patient feels well. She has no complaints today.   Discharge Exam: Vitals:   02/22/21 0619 02/22/21 0849  BP: (!) 144/69 (!) 148/61  Pulse: 75 82  Resp: 18 18  Temp: 97.7 F (36.5 C) 97.8 F (36.6 C)  SpO2: 97% 97%  General: Pt is alert, awake, not in acute distress Cardiovascular: RRR, S1/S2 +, no rubs, no gallops Respiratory: CTA bilaterally, no wheezing, no rhonchi Abdominal: Soft, NT, ND, bowel sounds + Extremities: no edema, no cyanosis  Labs: Basic Metabolic Panel: Recent Labs  Lab 02/17/21 0742 02/18/21 0603 02/19/21 0439 02/20/21 0521 02/21/21 0417 02/22/21 0445  NA 142 140 142 141 143 139  K 4.7 5.0 5.2* 5.2* 5.0 4.9  CL 102 104 104 106 108 104  CO2 25 24 23 25 24 23   GLUCOSE 151* 139* 145* 135* 159* 143*  BUN 141* 138* 123* 120* 107* 145*  CREATININE 5.24* 4.99* 4.99* 5.12* 5.23* 5.24*  CALCIUM 10.0 9.9 9.9 9.9 9.9 9.5  PHOS 7.6* 8.3* 8.1* 7.9* 8.1*  --     CBC: Recent Labs  Lab 02/16/21 0434 02/17/21 0742 02/20/21 0521 02/21/21 0417  WBC 15.9* 14.7* 14.6* 14.7*  HGB 9.5* 10.2* 9.2* 9.4*  HCT 31.6* 33.9* 30.8* 31.9*  MCV 91.3 91.4 90.6 92.2  PLT 270 275 251 247    Microbiology No results found for this or any previous visit (from the past 240 hour(s)).  Time coordinating discharge: Over 30 minutes  Richarda Osmond, MD  [facility] 02/22/2021, 10:36 AM

## 2021-02-22 NOTE — TOC Progression Note (Signed)
Transition of Care [facility]) - Progression Note    Patient Details  Name: Nicole Schmidt MRN: [id number] Date of Birth: July 26, 1965  Transition of Care [facility]) CM/SW [place], RN Phone Number: 02/22/2021, 3:41 PM  Clinical Narrative:    Scheduled an [place] [employer] ride for the patient to go home Ride ID number [id number] Will arrive at 4 PM in a grey [employer], drivers name is Audelia Acton, Notified the charge nurse        Expected Discharge Plan and Services           Expected Discharge Date: 02/22/21                                     Social Determinants of Health (SDOH) Interventions    Readmission Risk Interventions Readmission Risk Prevention Plan 02/11/2020 10/19/2018  Transportation Screening Complete Complete  PCP or Specialist Appt within 3-5 Days Not Complete -  Not Complete comments To be scheduled when discharged. -  [place] or Home Care Consult Complete -  Social Work Consult for [place] Planning/Counseling Complete Patient refused  Palliative Care Screening Complete Not Applicable  Medication Review [employer] photographer) Complete Complete  Some recent data might be hidden

## 2021-02-22 NOTE — Progress Notes (Signed)
Pt voided 400 cc of burgandy urine

## 2021-02-22 NOTE — Progress Notes (Signed)
Discharge instructions reviewed with pt. Pt voiced understanding of instructions.

## 2021-02-23 ENCOUNTER — Emergency Department: Payer: BC Managed Care – PPO

## 2021-02-23 ENCOUNTER — Other Ambulatory Visit: Payer: Self-pay

## 2021-02-23 ENCOUNTER — Inpatient Hospital Stay (HOSPITAL_REHABILITATION)
Admission: EM | Admit: 2021-02-23 | Discharge: 2021-03-04 | Disposition: A | Discharge status: Other Acute Inpatient Hospital | Payer: BC Managed Care – PPO | Source: Home / Self Care | Attending: Internal Medicine | Admitting: Internal Medicine

## 2021-02-23 ENCOUNTER — Ambulatory Visit (HOSPITAL_BASED_OUTPATIENT_CLINIC_OR_DEPARTMENT_OTHER): Payer: BC Managed Care – PPO | Admitting: Family

## 2021-02-23 ENCOUNTER — Encounter: Payer: Self-pay | Admitting: Family

## 2021-02-23 VITALS — BP 130/56 | HR 72 | Resp 18 | Ht 64.0 in | Wt 362.5 lb

## 2021-02-23 DIAGNOSIS — D631 Anemia in chronic kidney disease: Secondary | ICD-10-CM | POA: Diagnosis present

## 2021-02-23 DIAGNOSIS — Z86711 Personal history of pulmonary embolism: Secondary | ICD-10-CM | POA: Insufficient documentation

## 2021-02-23 DIAGNOSIS — Z853 Personal history of malignant neoplasm of breast: Secondary | ICD-10-CM | POA: Insufficient documentation

## 2021-02-23 DIAGNOSIS — J189 Pneumonia, unspecified organism: Secondary | ICD-10-CM | POA: Diagnosis present

## 2021-02-23 DIAGNOSIS — Z79899 Other long term (current) drug therapy: Secondary | ICD-10-CM

## 2021-02-23 DIAGNOSIS — I10 Essential (primary) hypertension: Secondary | ICD-10-CM

## 2021-02-23 DIAGNOSIS — E1122 Type 2 diabetes mellitus with diabetic chronic kidney disease: Secondary | ICD-10-CM | POA: Insufficient documentation

## 2021-02-23 DIAGNOSIS — I130 Hypertensive heart and chronic kidney disease with heart failure and stage 1 through stage 4 chronic kidney disease, or unspecified chronic kidney disease: Secondary | ICD-10-CM | POA: Insufficient documentation

## 2021-02-23 DIAGNOSIS — N184 Chronic kidney disease, stage 4 (severe): Secondary | ICD-10-CM

## 2021-02-23 DIAGNOSIS — I4819 Other persistent atrial fibrillation: Secondary | ICD-10-CM | POA: Diagnosis present

## 2021-02-23 DIAGNOSIS — E8779 Other fluid overload: Secondary | ICD-10-CM | POA: Diagnosis not present

## 2021-02-23 DIAGNOSIS — N186 End stage renal disease: Secondary | ICD-10-CM | POA: Diagnosis present

## 2021-02-23 DIAGNOSIS — Z20822 Contact with and (suspected) exposure to covid-19: Secondary | ICD-10-CM | POA: Diagnosis present

## 2021-02-23 DIAGNOSIS — Z800 Family history of malignant neoplasm of digestive organs: Secondary | ICD-10-CM

## 2021-02-23 DIAGNOSIS — E785 Hyperlipidemia, unspecified: Secondary | ICD-10-CM

## 2021-02-23 DIAGNOSIS — N179 Acute kidney failure, unspecified: Secondary | ICD-10-CM | POA: Diagnosis present

## 2021-02-23 DIAGNOSIS — I5033 Acute on chronic diastolic (congestive) heart failure: Secondary | ICD-10-CM | POA: Diagnosis present

## 2021-02-23 DIAGNOSIS — J9621 Acute and chronic respiratory failure with hypoxia: Secondary | ICD-10-CM | POA: Diagnosis present

## 2021-02-23 DIAGNOSIS — Z9989 Dependence on other enabling machines and devices: Secondary | ICD-10-CM

## 2021-02-23 DIAGNOSIS — G4733 Obstructive sleep apnea (adult) (pediatric): Secondary | ICD-10-CM | POA: Diagnosis present

## 2021-02-23 DIAGNOSIS — Z7901 Long term (current) use of anticoagulants: Secondary | ICD-10-CM

## 2021-02-23 DIAGNOSIS — N189 Chronic kidney disease, unspecified: Secondary | ICD-10-CM | POA: Insufficient documentation

## 2021-02-23 DIAGNOSIS — Z8249 Family history of ischemic heart disease and other diseases of the circulatory system: Secondary | ICD-10-CM

## 2021-02-23 DIAGNOSIS — E669 Obesity, unspecified: Secondary | ICD-10-CM | POA: Diagnosis present

## 2021-02-23 DIAGNOSIS — Z9981 Dependence on supplemental oxygen: Secondary | ICD-10-CM | POA: Insufficient documentation

## 2021-02-23 DIAGNOSIS — Z833 Family history of diabetes mellitus: Secondary | ICD-10-CM

## 2021-02-23 DIAGNOSIS — Z923 Personal history of irradiation: Secondary | ICD-10-CM

## 2021-02-23 DIAGNOSIS — I1310 Hypertensive heart and chronic kidney disease without heart failure, with stage 1 through stage 4 chronic kidney disease, or unspecified chronic kidney disease: Secondary | ICD-10-CM

## 2021-02-23 DIAGNOSIS — E611 Iron deficiency: Secondary | ICD-10-CM | POA: Diagnosis present

## 2021-02-23 DIAGNOSIS — E1169 Type 2 diabetes mellitus with other specified complication: Secondary | ICD-10-CM | POA: Diagnosis present

## 2021-02-23 DIAGNOSIS — E039 Hypothyroidism, unspecified: Secondary | ICD-10-CM | POA: Diagnosis present

## 2021-02-23 DIAGNOSIS — N2581 Secondary hyperparathyroidism of renal origin: Secondary | ICD-10-CM | POA: Diagnosis present

## 2021-02-23 DIAGNOSIS — J9611 Chronic respiratory failure with hypoxia: Secondary | ICD-10-CM | POA: Diagnosis present

## 2021-02-23 DIAGNOSIS — I480 Paroxysmal atrial fibrillation: Secondary | ICD-10-CM | POA: Diagnosis present

## 2021-02-23 DIAGNOSIS — Z79890 Hormone replacement therapy (postmenopausal): Secondary | ICD-10-CM

## 2021-02-23 DIAGNOSIS — I132 Hypertensive heart and chronic kidney disease with heart failure and with stage 5 chronic kidney disease, or end stage renal disease: Secondary | ICD-10-CM | POA: Diagnosis present

## 2021-02-23 DIAGNOSIS — Z6841 Body Mass Index (BMI) 40.0 and over, adult: Secondary | ICD-10-CM

## 2021-02-23 LAB — BASIC METABOLIC PANEL
Anion gap: 14 (ref 5–15)
BUN: 136 mg/dL — ABNORMAL HIGH (ref 6–20)
CO2: 22 mmol/L (ref 22–32)
Calcium: 9.4 mg/dL (ref 8.9–10.3)
Chloride: 104 mmol/L (ref 98–111)
Creatinine, Ser: 5.82 mg/dL — ABNORMAL HIGH (ref 0.44–1.00)
GFR, Estimated: 8 mL/min — ABNORMAL LOW (ref >60)
Glucose, Bld: 128 mg/dL — ABNORMAL HIGH (ref 70–99)
Potassium: 5 mmol/L (ref 3.5–5.1)
Sodium: 140 mmol/L (ref 135–145)

## 2021-02-23 LAB — CBC
HCT: 30 % — ABNORMAL LOW (ref 36.0–46.0)
Hemoglobin: 8.8 g/dL — ABNORMAL LOW (ref 12.0–15.0)
MCH: 27.4 pg (ref 26.0–34.0)
MCHC: 29.3 g/dL — ABNORMAL LOW (ref 30.0–36.0)
MCV: 93.5 fL (ref 80.0–100.0)
Platelets: 262 10*3/uL (ref 150–400)
RBC: 3.21 MIL/uL — ABNORMAL LOW (ref 3.87–5.11)
RDW: 15.7 % — ABNORMAL HIGH (ref 11.5–15.5)
WBC: 16.6 10*3/uL — ABNORMAL HIGH (ref 4.0–10.5)
nRBC: 0.1 % (ref 0.0–0.2)

## 2021-02-23 LAB — RESP PANEL BY RT-PCR (FLU A&B, COVID) ARPGX2
Influenza A by PCR: NEGATIVE
Influenza B by PCR: NEGATIVE
SARS Coronavirus 2 by RT PCR: NEGATIVE

## 2021-02-23 LAB — BRAIN NATRIURETIC PEPTIDE: B Natriuretic Peptide: 375.4 pg/mL — ABNORMAL HIGH (ref 0.0–100.0)

## 2021-02-23 LAB — TROPONIN I (HIGH SENSITIVITY)
Troponin I (High Sensitivity): 10 ng/L (ref <18)
Troponin I (High Sensitivity): 9 ng/L (ref <18)

## 2021-02-23 NOTE — Patient Instructions (Signed)
Continue weighing daily and call for an overnight weight gain of 3 pounds or more or a weekly weight gain of more than 5 pounds.  °

## 2021-02-23 NOTE — ED Triage Notes (Signed)
Pt to ED for increased shob. States was d/c yesterday from admission for renal failure, thinks was d/c too soon.  Pt wears 2 LNC chronic. Pt able to speak in complete sentences

## 2021-02-23 NOTE — H&P (Signed)
[place]   PATIENT NAME: Nicole Schmidt    MR#:  [id number]  DATE OF BIRTH:  Nov 21, 1965  DATE OF ADMISSION:  02/23/2021  PRIMARY CARE PHYSICIAN: Lenard Simmer, MD   Patient is coming from: Home  REQUESTING/REFERRING PHYSICIAN: Lurline Hare, MD  CHIEF COMPLAINT:   Chief Complaint  Patient presents with   Shortness of Breath    HISTORY OF PRESENT ILLNESS:  Nicole Schmidt is a 56 y.o. Caucasian female with medical history significant for type 2 diabetes mellitus, hypertension, dyslipidemia, hypothyroidism, gout, HFpEF, atrial fibrillation and PE on Eliquis, who had a recent admission here from 02/01/21 till 02/22/21 during which she was managed for acute on chronic diastolic CHF and AKI superimposed on CKD stage III.  She presents to the ER with worsening lower extremity edema as well as increased dyspnea since yesterday.  She admits to dyspnea on exertion and has been having diminished urine output.  She has been increasing her home O2.  Her cough and wheezing been improving.  No fever or chills.  No chest pain or palpitations.  ED Course: When she came to the ER blood pressure was 130/56 with pulse 70 of 99% on 2 L of O2 by nasal cannula.  BMP showed a BUN of 136 and creatinine of 5.82 compared to 145 and 5.24 on 1/10.  BNP was 375.4 and high-sensitivity troponin I was 10.  CBC showed leukocytosis 16.6 compared to 14.7 on 1/9 with anemia close to previous levels.. EKG as reviewed by me : EKG showed atrial fibrillation with controlled ventricular sponsor of 69 with low voltage QRS  Imaging: Chest x-ray showed cardiomegaly with central pulmonary vascular prominence and prominent interstitial markings in the perihilar regions more on the right side suggesting mild asymmetric interstitial pulmonary edema with interval decrease in patchy infiltrates in the perihilar regions and lower lung fields suggesting resolving atelectasis/pneumonia.  The patient will be admitted to a medical  telemetry observation bed for further evaluation and management. PAST MEDICAL HISTORY:   Past Medical History:  Diagnosis Date   (HFpEF) heart failure with preserved ejection fraction ([place])    a. 10/2018 Echo: EF 60-65%, diast dysfxn, RVSP 50.7 mmHg, mildly dil LA.   Acquired hypothyroidism 02/09/2020   Anemia    Arthritis    Breast cancer ([place]) 2014   right breast cancer   Breast cancer of upper-inner quadrant of right female breast ([place])    Right breast invasive CA and DCIS , 7 mm T1,N0,M0. Er/PR pos, her 2 negative.  Margins 1 mm.   CHF (congestive heart failure) (HCC)    CKD (chronic kidney disease), stage III (HCC)    Diabetes mellitus type 2 in obese ([place]) 02/09/2020   Diabetes mellitus without complication ([place])    Dyslipidemia 02/09/2020   Essential hypertension    Gout    Hypothyroidism    Menopause    age 62   Morbid obesity ([place])    Persistent atrial fibrillation ([place])    a. Dx 10/2018 in setting of PNA. CHA2DS2VASc = 4-->Eliquis; b. 11/2018 s/p successful DCCV.   Personal history of radiation therapy    Pulmonary embolism ([place]) 10/2014   a. Chronic eliquis.   Thyroid goiter     PAST SURGICAL HISTORY:   Past Surgical History:  Procedure Laterality Date   BREAST BIOPSY Right 2014   breast ca   BREAST EXCISIONAL BIOPSY Right 07/19/2012   breast ca   BREAST SURGERY Right 2014   with  sentinel node bx subareaolar duct excision   CARDIOVERSION N/A 11/28/2018   Procedure: CARDIOVERSION;  Surgeon: Minna Merritts, MD;  Location: [facility] ORS;  Service: Cardiovascular;  Laterality: N/A;   COLONOSCOPY WITH PROPOFOL N/A 01/30/2017   Procedure: COLONOSCOPY WITH PROPOFOL;  Surgeon: Christene Lye, MD;  Location: [facility] ENDOSCOPY;  Service: Endoscopy;  Laterality: N/A;   CYSTOSCOPY W/ URETERAL STENT PLACEMENT Left 02/14/2021   Procedure: CYSTOSCOPY WITH RETROGRADE PYELOGRAM/URETERAL STENT PLACEMENT;  Surgeon: Billey Co, MD;  Location: [facility]  ORS;  Service: Urology;  Laterality: Left;    SOCIAL HISTORY:   Social History   Tobacco Use   Smoking status: Never   Smokeless tobacco: Never  Substance Use Topics   Alcohol use: No    Alcohol/week: 0.0 standard drinks    FAMILY HISTORY:   Family History  Problem Relation Age of Onset   Diabetes Father    Hypertension Father    Parkinson's disease Father    Hypertension Mother    Rheum arthritis Mother    Atrial fibrillation Mother    Heart failure Mother    Heart attack Mother        died in her mid-70's.   Colon cancer Cousin 42   Breast cancer Neg Hx     DRUG ALLERGIES:  No Known Allergies  REVIEW OF SYSTEMS:   ROS As per history of present illness. All pertinent systems were reviewed above. Constitutional, HEENT, cardiovascular, respiratory, GI, GU, musculoskeletal, neuro, psychiatric, endocrine, integumentary and hematologic systems were reviewed and are otherwise negative/unremarkable except for positive findings mentioned above in the HPI.   MEDICATIONS AT HOME:   Prior to Admission medications   Medication Sig Start Date End Date Taking? Authorizing Provider  acetaminophen (TYLENOL) 500 MG tablet Take 1,000 mg by mouth every 6 (six) hours as needed (for pain).    [provider]  albuterol (VENTOLIN HFA) 108 (90 Base) MCG/ACT inhaler Inhale 2 puffs into the lungs every 6 (six) hours as needed for wheezing or shortness of breath.    [provider]  amLODipine (NORVASC) 5 MG tablet Take 5 mg by mouth 2 (two) times daily. 09/18/18   [provider]  atorvastatin (LIPITOR) 20 MG tablet Take 20 mg by mouth at bedtime.     [provider]  calcitRIOL (ROCALTROL) 0.25 MCG capsule Take 0.25 mcg by mouth daily. 05/13/20   [provider]  calcium carbonate (OSCAL) 1500 (600 Ca) MG TABS tablet Take 600 mg of elemental calcium by mouth daily.     [provider]  Cholecalciferol (VITAMIN D3) 50000 units  TABS Take 50,000 Units by mouth every Friday.     [provider]  cloNIDine (CATAPRES) 0.2 MG tablet Take 1 tablet (0.2 mg total) by mouth 2 (two) times daily. 10/21/18   Mayo, Pete Pelt, MD  Dulaglutide 1.5 MG/0.5ML SOPN Inject 1.5 mg into the skin every 7 (seven) days.    [provider]  ELIQUIS 5 MG TABS tablet TAKE 1 TABLET TWICE A DAY 12/27/20   Sindy Guadeloupe, MD  folic acid (FOLVITE) 846 MCG tablet Take 800 mcg by mouth daily.     [provider]  levothyroxine (SYNTHROID, LEVOTHROID) 112 MCG tablet Take 112 mcg daily before breakfast by mouth.    [provider]  Melatonin 10 MG TABS Take 10 mg by mouth at bedtime.    [provider]  metoprolol tartrate (LOPRESSOR) 100 MG tablet Take 1 tablet (100 mg total) by mouth  2 (two) times daily. 02/22/21 03/24/21  Richarda Osmond, MD  fluticasone-salmeterol (ADVAIR HFA) [phone] MCG/ACT inhaler Inhale 2 puffs into the lungs 2 (two) times daily as needed.  Patient not taking: No sig reported  02/09/20  [provider]      VITAL SIGNS:  Blood pressure (!) 149/54, pulse 77, temperature 97.8 F (36.6 C), temperature source Oral, resp. rate 20, height 5\' 4"  (1.626 m), last menstrual period 07/17/2012, SpO2 100 %.  PHYSICAL EXAMINATION:  Physical Exam  GENERAL:  56 y.o.-year-old Caucasian female patient lying in the bed with no acute distress.  EYES: Pupils equal, round, reactive to light and accommodation. No scleral icterus. Extraocular muscles intact.  HEENT: Head atraumatic, normocephalic. Oropharynx and nasopharynx clear.  NECK:  Supple, no jugular venous distention. No thyroid enlargement, no tenderness.  LUNGS: Normal breath sounds bilaterally, no wheezing, rales,rhonchi or crepitation. No use of accessory muscles of respiration.  CARDIOVASCULAR: Regular rate and rhythm, S1, S2 normal. No murmurs, rubs, or gallops.  ABDOMEN: Soft, nondistended, nontender. Bowel sounds present. No  organomegaly or mass.  EXTREMITIES: No pedal edema, cyanosis, or clubbing.  NEUROLOGIC: Cranial nerves II through XII are intact. Muscle strength 5/5 in all extremities. Sensation intact. Gait not checked.  PSYCHIATRIC: The patient is alert and oriented x 3.  Normal affect and good eye contact. SKIN: No obvious rash, lesion, or ulcer.   LABORATORY PANEL:   CBC Recent Labs  Lab 02/23/21 1721  WBC 16.6*  HGB 8.8*  HCT 30.0*  PLT 262   ------------------------------------------------------------------------------------------------------------------  Chemistries  Recent Labs  Lab 02/23/21 1721  NA 140  K 5.0  CL 104  CO2 22  GLUCOSE 128*  BUN 136*  CREATININE 5.82*  CALCIUM 9.4   ------------------------------------------------------------------------------------------------------------------  Cardiac Enzymes No results for input(s): TROPONINI in the last 168 hours. ------------------------------------------------------------------------------------------------------------------  RADIOLOGY:  DG Chest 2 View  Result Date: 02/23/2021 CLINICAL DATA:  Shortness of breath EXAM: CHEST - 2 VIEW COMPARISON:  Previous studies including the examination of 02/08/2021 FINDINGS: Transverse diameter of heart is increased. There is subtle prominence of interstitial markings in the parahilar regions. There is interval decrease in patchy linear densities in the parahilar regions and lower lung fields. There is no new focal pulmonary consolidation. Lateral CP angles are essentially clear. There is no pneumothorax. IMPRESSION: Cardiomegaly. Central pulmonary vessels are prominent. There is subtle prominence of interstitial markings in the parahilar regions, more so on the right side, possibly suggesting mild asymmetric interstitial pulmonary edema. There is interval decrease in patchy infiltrates in the parahilar regions and lower lung fields suggesting resolving atelectasis/pneumonia.  Electronically Signed   By: Elmer Picker M.D.   On: 02/23/2021 18:13      IMPRESSION AND PLAN:  Principal Problem:   AKI (acute kidney injury) ([place])  1.  Acute kidney injury superimposed on stage III chronic kidney disease with concern about end-stage renal disease. - The patient will be admitted to a medical telemetry observation bed. - Nephrology consultation will be obtained. - I notified Dr. Holley Raring about the patient as she may need acute dialysis.  2.  Fluid overload like secondary to #1. - Will diurese with IV Lasix for pulmonary congestion.  3.  Essential hypertension. - We will continue Norvasc and clonidine as well as Lopressor.  4.  Dyslipidemia. - We will continue statin therapy.  5.  Hypothyroidism. - We will continue Synthroid.  DVT prophylaxis: Subcu heparin.  Code Status: full code. Family Communication:  The plan of care  was discussed in details with the patient (and family). I answered all questions. The patient agreed to proceed with the above mentioned plan. Further management will depend upon hospital course. Disposition Plan: Back to previous home environment Consults called: Nephrology.  All the records are reviewed and case discussed with ED provider.  Status is: Observation   I certify that at the time of admission, it is my clinical judgment that the patient will require inpatient hospital care extending less than 2 midnights.                            Dispo: The patient is from: Home              Anticipated d/c is to: Home              Patient currently is not medically stable to d/c.              Difficult to place patient: No    Christel Mormon M.D on 02/23/2021 at 11:52 PM  [facility]   From 7 PM-7 AM, contact night-coverage [email or web address]  CC: Primary care physician; Lenard Simmer, MD

## 2021-02-23 NOTE — ED Provider Triage Note (Signed)
°  Emergency Medicine Provider Triage Evaluation Note  Nicole Schmidt , a 56 y.o.female,  was evaluated in triage.  Pt complains of shortness of breath.  Patient states that she was discharged yesterday from admission for renal failure.  She has an appointment with heart failure clinic tomorrow, however she has noticed a rapid onset of swelling in her feet and shortness of breath.  She states that she does not think that she can wait till tomorrow to be seen again.  Denies fever/chills, chest pain, abdominal pain, back pain, or urinary symptoms.    Review of Systems  Positive: Shortness of breath Negative: Denies fever, chest pain, vomiting  Physical Exam   Vitals:   02/23/21 1720  BP: 135/61  Pulse: 85  Resp: 20  SpO2: 99%   Gen:   Awake, no distress   Resp:  Normal effort  MSK:   Moves extremities without difficulty  Other:    Medical Decision Making  Given the patient's initial medical screening exam, the following diagnostic evaluation has been ordered. The patient will be placed in the appropriate treatment space, once one is available, to complete the evaluation and treatment. I have discussed the plan of care with the patient and I have advised the patient that an ED physician or mid-level practitioner will reevaluate their condition after the test results have been received, as the results may give them additional insight into the type of treatment they may need.    Diagnostics: Labs, EKG, CXR  Treatments: none immediately   Teodoro Spray, [place] 02/23/21 1729

## 2021-02-23 NOTE — Progress Notes (Signed)
Patient ID: Nicole Schmidt, female    DOB: March 16, 1965, 57 y.o.   MRN: [id number]  HPI  Nicole Schmidt is a 56 y/o female with a history of breast cancer, DM, HTN, CKD, thyroid disease, anemia, gout, pulmonary embolus and chronic heart failure.   Echo report from 02/09/21 reviewed and showed an EF of 60-65% along with moderate LVH, LAE and trivial MR. Echo report from 02/09/20 reviewed and showed an EF of 65-70% along with severe LVH and moderate LAE. Echo report from 10/20/2018 reviewed and showed an EF of 60-65% along with an elevated PA pressure of 50.7 mmHg. Echo report from 11/20/17 reviewed and showed an EF of 55-60%.  Admitted 02/09/21 due to shortness of breath and sore throat. Found to be in AKI. Initially given IV lasix. Found to have left proximal ureteral stone and hydronephrosis. Urology, nephrology and orthopaedic consults obtained. Diuretic was stopped due to AKI.  Discharged after 13 days.   She presents today for an acute visit with a chief complaint of worsening SOB since her discharge yesterday. She has associated fatigue, cough, pedal edema (worsening), weakness and weight gain. She denies difficulty sleeping, dizziness, abdominal distention, palpitations or chest pain.   She said that yesterday before leaving the hospital, she expressed her concern that she was so SOB walking to the bathroom and that she felt like she was getting "pushed out of the hospital". She says that she was so SOB last night that she could barely walk up the ramp into her home. She's had to increase her oxygen from 2L up to 4L without any improvement.   She understands that her diuretic was stopped due to worsening renal function but expresses concern about worsening edema, fatigue and weight gain. She also says that she "doesn't know what to do and needs some guidance". She says that it took her almost an hour just to get dressed and that she "doesn't feel right when up".   Past Medical History:  Diagnosis Date    (HFpEF) heart failure with preserved ejection fraction ([place])    a. 10/2018 Echo: EF 60-65%, diast dysfxn, RVSP 50.7 mmHg, mildly dil LA.   Acquired hypothyroidism 02/09/2020   Anemia    Arthritis    Breast cancer ([place]) 2014   right breast cancer   Breast cancer of upper-inner quadrant of right female breast ([place])    Right breast invasive CA and DCIS , 7 mm T1,N0,M0. Er/PR pos, her 2 negative.  Margins 1 mm.   CHF (congestive heart failure) (HCC)    CKD (chronic kidney disease), stage III (HCC)    Diabetes mellitus type 2 in obese ([place]) 02/09/2020   Diabetes mellitus without complication ([place])    Dyslipidemia 02/09/2020   Essential hypertension    Gout    Hypothyroidism    Menopause    age 60   Morbid obesity ([place])    Persistent atrial fibrillation ([place])    a. Dx 10/2018 in setting of PNA. CHA2DS2VASc = 4-->Eliquis; b. 11/2018 s/p successful DCCV.   Personal history of radiation therapy    Pulmonary embolism ([place]) 10/2014   a. Chronic eliquis.   Thyroid goiter    Past Surgical History:  Procedure Laterality Date   BREAST BIOPSY Right 2014   breast ca   BREAST EXCISIONAL BIOPSY Right 07/19/2012   breast ca   BREAST SURGERY Right 2014   with sentinel node bx subareaolar duct excision   CARDIOVERSION N/A 11/28/2018   Procedure: CARDIOVERSION;  Surgeon: Rockey Situ,  Kathlene November, MD;  Location: [facility] ORS;  Service: Cardiovascular;  Laterality: N/A;   COLONOSCOPY WITH PROPOFOL N/A 01/30/2017   Procedure: COLONOSCOPY WITH PROPOFOL;  Surgeon: Christene Lye, MD;  Location: [facility] ENDOSCOPY;  Service: Endoscopy;  Laterality: N/A;   CYSTOSCOPY W/ URETERAL STENT PLACEMENT Left 02/14/2021   Procedure: CYSTOSCOPY WITH RETROGRADE PYELOGRAM/URETERAL STENT PLACEMENT;  Surgeon: Billey Co, MD;  Location: [facility] ORS;  Service: Urology;  Laterality: Left;   Family History  Problem Relation Age of Onset   Diabetes Father    Hypertension Father    Parkinson's disease Father    Hypertension  Mother    Rheum arthritis Mother    Atrial fibrillation Mother    Heart failure Mother    Heart attack Mother        died in her mid-70's.   Colon cancer Cousin 30   Breast cancer Neg Hx    Social History   Tobacco Use   Smoking status: Never   Smokeless tobacco: Never  Substance Use Topics   Alcohol use: No    Alcohol/week: 0.0 standard drinks   Prior to Admission medications   Medication Sig Start Date End Date Taking? Authorizing Provider  acetaminophen (TYLENOL) 500 MG tablet Take 1,000 mg by mouth every 6 (six) hours as needed (for pain).   Yes [provider]  albuterol (VENTOLIN HFA) 108 (90 Base) MCG/ACT inhaler Inhale 2 puffs into the lungs every 6 (six) hours as needed for wheezing or shortness of breath.   Yes [provider]  amLODipine (NORVASC) 5 MG tablet Take 5 mg by mouth 2 (two) times daily. 09/18/18  Yes [provider]  atorvastatin (LIPITOR) 20 MG tablet Take 20 mg by mouth at bedtime.    Yes [provider]  calcitRIOL (ROCALTROL) 0.25 MCG capsule Take 0.25 mcg by mouth daily. 05/13/20  Yes [provider]  calcium carbonate (OSCAL) 1500 (600 Ca) MG TABS tablet Take 600 mg of elemental calcium by mouth daily.    Yes [provider]  Cholecalciferol (VITAMIN D3) 50000 units TABS Take 50,000 Units by mouth every Friday.    Yes [provider]  cloNIDine (CATAPRES) 0.2 MG tablet Take 1 tablet (0.2 mg total) by mouth 2 (two) times daily. 10/21/18  Yes Mayo, Pete Pelt, MD  Dulaglutide 1.5 MG/0.5ML SOPN Inject 1.5 mg into the skin every 7 (seven) days.   Yes [provider]  ELIQUIS 5 MG TABS tablet TAKE 1 TABLET TWICE A DAY 12/27/20  Yes Sindy Guadeloupe, MD  folic acid (FOLVITE) 673 MCG tablet Take 800 mcg by mouth daily.    Yes [provider]  levothyroxine (SYNTHROID, LEVOTHROID) 112 MCG tablet Take 112 mcg daily before breakfast by mouth.   Yes [provider]  Melatonin 10 MG TABS  Take 10 mg by mouth at bedtime.   Yes [provider]  metoprolol tartrate (LOPRESSOR) 100 MG tablet Take 1 tablet (100 mg total) by mouth 2 (two) times daily. 02/22/21 03/24/21 Yes Richarda Osmond, MD  fluticasone-salmeterol (ADVAIR HFA) [phone] MCG/ACT inhaler Inhale 2 puffs into the lungs 2 (two) times daily as needed.  Patient not taking: No sig reported  02/09/20  [provider]    Review of Systems  Constitutional:  Positive for fatigue (easily). Negative for appetite change.  HENT:  Negative for congestion, postnasal drip and sore throat.   Eyes: Negative.   Respiratory:  Positive for cough and shortness of breath (moderate (worsening)).  Cardiovascular:  Positive for leg swelling (worsening). Negative for chest pain and palpitations.  Gastrointestinal:  Negative for abdominal distention and abdominal pain.  Endocrine: Negative.   Genitourinary: Negative.   Musculoskeletal:  Negative for back pain and neck pain.  Skin: Negative.   Allergic/Immunologic: Negative.   Neurological:  Positive for weakness. Negative for dizziness and light-headedness.  Hematological:  Negative for adenopathy. Does not bruise/bleed easily.  Psychiatric/Behavioral:  Negative for dysphoric mood and sleep disturbance. The patient is not nervous/anxious.    Vitals:   02/23/21 1550  BP: (!) 130/56  Pulse: 72  Resp: 18  SpO2: 100%  Weight: (!) 362 lb 8 oz (164.4 kg)  Height: 5\' 4"  (1.626 m)   Wt Readings from Last 3 Encounters:  02/23/21 (!) 362 lb 8 oz (164.4 kg)  02/21/21 (!) 357 lb 2.3 oz (162 kg)  01/26/21 (!) 351 lb (159.2 kg)   Lab Results  Component Value Date   CREATININE 5.24 (H) 02/22/2021   CREATININE 5.23 (H) 02/21/2021   CREATININE 5.12 (H) 02/20/2021   Physical Exam Vitals reviewed.  Constitutional:      Appearance: Normal appearance.  HENT:     Head: Normocephalic and atraumatic.  Cardiovascular:     Rate and Rhythm: Normal rate and regular rhythm.   Pulmonary:     Effort: Pulmonary effort is normal. No respiratory distress.     Breath sounds: No wheezing or rales.  Abdominal:     General: There is no distension.     Palpations: Abdomen is soft.  Musculoskeletal:        General: No tenderness.     Cervical back: Normal range of motion and neck supple.     Right lower leg: No tenderness. Edema (2+ pitting) present.     Left lower leg: No tenderness. Edema (2+ pitting) present.  Skin:    General: Skin is warm and dry.  Neurological:     General: No focal deficit present.     Mental Status: She is alert and oriented to person, place, and time.  Psychiatric:        Mood and Affect: Mood normal.        Behavior: Behavior normal.        Thought Content: Thought content normal.   Assessment & Plan:  1: Acute on Chronic heart failure with preserved ejection fraction with structural changes (LVH) with cardiorenal syndrome- - NYHA class III - moderately fluid overloaded with weight gain, worsening edema/ worsening SOB since discharge yesterday - weighing daily; reminded to weigh every morning, write the weight down and call for an overnight weight gain of >2 pounds or a weekly weight gain of >5 pounds - weight up 11 pounds from last visit here 1 month ago; up 5 pounds from 2 days ago  - saw cardiology ([place]) 12/06/20; returns 03/28/21 - discussed limitations with diuretic usage with severe renal failure; offered to reach out to nephrologist but that it would be too late in the day to give IV lasix outpatient - she expresses understanding of her kidney function but that she doesn't feel like she can go home feeling like this - patient escorted in a wheelchair by a hospital volunteer and the HF Clinic NT; all patient belongings were taken with patient - advised patient that I would send a message to nephrology; secure chat was sent to Dr. Holley Raring - BNP 02/10/21 was 221.7  2: HTN- - BP looks good (130/56) - BMP from 02/23/21 reviewed and  showed  sodium 140, potassium 5.0, creatinine 5.82 and GFR 8 - saw PCP Ronnald Collum) March 2022 - saw nephrology Holley Raring) 12/30/20  3: Cardiorenal syndrome- - diuretic was stopped during admission due worsening renal function - dialysis has been discussed but trying to post-pone as long as possible - explained that it's uncertain if diuretics would work well right now and that she may be headed towards dialysis to improve her fluid status  4: Obstructive sleep apnea- - saw pulmonology ([place]) 01/13/21 - wearing CPAP - now wearing oxygen at 4L around the clock    Medication list reviewed.   Due to weight gain, worsening edema and worsening shortness of breath in someone who's renal function is worsening, will send patient to ED for further evaluation and treatment. Explained my concern about resuming diuretic with her renal disease and that I felt it would be safest to go to the ED for further evaluation. Patient agreeable and she was escorted by HF NT and hospital volunteer.   Follow-up pending hospital disposition

## 2021-02-23 NOTE — ED Provider Notes (Signed)
[facility] Provider Note    Event Date/Time   First MD Initiated Contact with Patient 02/23/21 2305     (approximate)   History   Shortness of Breath   HPI  Nicole Schmidt is a 56 y.o. female who presents to the ED from home with a chief complaint of increased shortness of breath and generalized weakness.  Patient with a medical history of CHF on 2 L baseline oxygen, paroxysmal atrial fibrillation, hypertension, CKD, diabetes, history of PE on Eliquis with recent admission 02/09/2021-02/22/2021 for cardiorenal syndrome.  On 02/17/2021 had left ureteroscopy with laser lithotripsy and stent exchange for obstructing stone.  Think she was discharged too early.  Had a follow-up with her CHF specialist today.  Usually on 2 L nasal cannula oxygen but she increase it to 4 L today secondary to increased shortness of breath.  Endorses associated chest tightness.  Denies fever, cough, abdominal pain, nausea, vomiting or dizziness.     Past Medical History   Past Medical History:  Diagnosis Date   (HFpEF) heart failure with preserved ejection fraction ([place])    a. 10/2018 Echo: EF 60-65%, diast dysfxn, RVSP 50.7 mmHg, mildly dil LA.   Acquired hypothyroidism 02/09/2020   Anemia    Arthritis    Breast cancer ([place]) 2014   right breast cancer   Breast cancer of upper-inner quadrant of right female breast ([place])    Right breast invasive CA and DCIS , 7 mm T1,N0,M0. Er/PR pos, her 2 negative.  Margins 1 mm.   CHF (congestive heart failure) (HCC)    CKD (chronic kidney disease), stage III (HCC)    Diabetes mellitus type 2 in obese ([place]) 02/09/2020   Diabetes mellitus without complication ([place])    Dyslipidemia 02/09/2020   Essential hypertension    Gout    Hypothyroidism    Menopause    age 3   Morbid obesity ([place])    Persistent atrial fibrillation ([place])    a. Dx 10/2018 in setting of PNA. CHA2DS2VASc = 4-->Eliquis; b. 11/2018 s/p successful DCCV.   Personal history of  radiation therapy    Pulmonary embolism ([place]) 10/2014   a. Chronic eliquis.   Thyroid goiter      Active Problem List   Patient Active Problem List   Diagnosis Date Noted   Viral URI    Multifocal pneumonia 02/19/2021   Hydronephrosis with renal and ureteral calculus obstruction 02/19/2021   Hyperkalemia 02/19/2021   Cardiorenal syndrome, stage 1-4 or unspecified chronic kidney disease, with heart failure ([place]) 02/09/2021    Class: Hospitalized for   AKI (acute kidney injury) ([place]) 02/09/2021   Bronchitis 02/09/2021   Cardiorenal syndrome with renal failure 02/09/2021   Chronic respiratory failure with hypoxia ([place]) 01/13/2021   Physical deconditioning 03/16/2020   Abnormal findings on diagnostic imaging of lung 03/16/2020   AF (paroxysmal atrial fibrillation) ([place])    Acquired thrombophilia ([place])    Acute on chronic respiratory failure with hypoxia ([place]) 02/21/2020   Acute on chronic respiratory failure with hypoxemia ([place]) 02/21/2020   Atrial fibrillation, chronic ([place]) 02/09/2020   Dyslipidemia 02/09/2020   Diabetes mellitus type 2 in obese ([place]) 02/09/2020   Acquired hypothyroidism 02/09/2020   Thrush, oral 02/09/2020   Morbid obesity with BMI of 60.0-69.9, adult ([place]) 02/03/2020   Chronic anticoagulation 12/30/2019   OSA on CPAP 08/04/2019   Atrial fibrillation with RVR ([place]) 10/18/2018   Acute hypoxemic respiratory failure ([place]) 03/29/2018   Acute on chronic diastolic CHF (  congestive heart failure) ([place]) 02/04/2018   Lymphedema 02/04/2018   Essential hypertension 12/09/2017   Long term (current) use of aromatase inhibitors 11/17/2017   Elevated alkaline phosphatase level 05/20/2017   Goals of care, counseling/discussion 05/14/2017   Chronic renal disease, stage III ([place]) 07/08/2015   Iron deficiency anemia 07/08/2015   Acute renal failure superimposed on stage 4 chronic kidney disease ([place]) 10/22/2014   History of breast cancer 10/22/2014   Elevated troponin  10/22/2014   Pulmonary emboli ([place]) 10/18/2014   History of pulmonary embolus (PE) 10/18/2014   Candidiasis of breast 07/06/2014   Malignant neoplasm of right female breast ([place]) 07/24/2012     Past Surgical History   Past Surgical History:  Procedure Laterality Date   BREAST BIOPSY Right 2014   breast ca   BREAST EXCISIONAL BIOPSY Right 07/19/2012   breast ca   BREAST SURGERY Right 2014   with sentinel node bx subareaolar duct excision   CARDIOVERSION N/A 11/28/2018   Procedure: CARDIOVERSION;  Surgeon: Minna Merritts, MD;  Location: [facility] ORS;  Service: Cardiovascular;  Laterality: N/A;   COLONOSCOPY WITH PROPOFOL N/A 01/30/2017   Procedure: COLONOSCOPY WITH PROPOFOL;  Surgeon: Christene Lye, MD;  Location: [facility] ENDOSCOPY;  Service: Endoscopy;  Laterality: N/A;   CYSTOSCOPY W/ URETERAL STENT PLACEMENT Left 02/14/2021   Procedure: CYSTOSCOPY WITH RETROGRADE PYELOGRAM/URETERAL STENT PLACEMENT;  Surgeon: Billey Co, MD;  Location: [facility] ORS;  Service: Urology;  Laterality: Left;     Home Medications   Prior to Admission medications   Medication Sig Start Date End Date Taking? Authorizing Provider  acetaminophen (TYLENOL) 500 MG tablet Take 1,000 mg by mouth every 6 (six) hours as needed (for pain).    [provider]  albuterol (VENTOLIN HFA) 108 (90 Base) MCG/ACT inhaler Inhale 2 puffs into the lungs every 6 (six) hours as needed for wheezing or shortness of breath.    [provider]  amLODipine (NORVASC) 5 MG tablet Take 5 mg by mouth 2 (two) times daily. 09/18/18   [provider]  atorvastatin (LIPITOR) 20 MG tablet Take 20 mg by mouth at bedtime.     [provider]  calcitRIOL (ROCALTROL) 0.25 MCG capsule Take 0.25 mcg by mouth daily. 05/13/20   [provider]  calcium carbonate (OSCAL) 1500 (600 Ca) MG TABS tablet Take 600 mg of elemental calcium by mouth daily.     [provider]  Cholecalciferol (VITAMIN  D3) 50000 units TABS Take 50,000 Units by mouth every Friday.     [provider]  cloNIDine (CATAPRES) 0.2 MG tablet Take 1 tablet (0.2 mg total) by mouth 2 (two) times daily. 10/21/18   Mayo, Pete Pelt, MD  Dulaglutide 1.5 MG/0.5ML SOPN Inject 1.5 mg into the skin every 7 (seven) days.    [provider]  ELIQUIS 5 MG TABS tablet TAKE 1 TABLET TWICE A DAY 12/27/20   Sindy Guadeloupe, MD  folic acid (FOLVITE) 683 MCG tablet Take 800 mcg by mouth daily.     [provider]  levothyroxine (SYNTHROID, LEVOTHROID) 112 MCG tablet Take 112 mcg daily before breakfast by mouth.    [provider]  Melatonin 10 MG TABS Take 10 mg by mouth at bedtime.    [provider]  metoprolol tartrate (LOPRESSOR) 100 MG tablet Take 1 tablet (100 mg total) by mouth 2 (two) times daily. 02/22/21 03/24/21  Richarda Osmond, MD  fluticasone-salmeterol (ADVAIR HFA) [phone] MCG/ACT inhaler Inhale 2 puffs into the  lungs 2 (two) times daily as needed.  Patient not taking: No sig reported  02/09/20  [provider]     Allergies  Patient has no known allergies.   Family History   Family History  Problem Relation Age of Onset   Diabetes Father    Hypertension Father    Parkinson's disease Father    Hypertension Mother    Rheum arthritis Mother    Atrial fibrillation Mother    Heart failure Mother    Heart attack Mother        died in her mid-70's.   Colon cancer Cousin 76   Breast cancer Neg Hx      Physical Exam  Triage Vital Signs: ED Triage Vitals  Enc Vitals Group     BP 02/23/21 1720 135/61     Pulse Rate 02/23/21 1720 85     Resp 02/23/21 1720 20     Temp 02/23/21 2127 97.8 F (36.6 C)     Temp Source 02/23/21 2127 Oral     SpO2 02/23/21 1720 99 %     Weight --      Height 02/23/21 1721 5\' 4"  (1.626 m)     Head Circumference --      Peak Flow --      Pain Score 02/23/21 1721 0     Pain Loc --      Pain Edu? --      Excl. in [place]? --      Updated Vital Signs: BP (!) 149/54 (BP Location: Left Arm)    Pulse 77    Temp 97.8 F (36.6 C) (Oral)    Resp 20    Ht 5\' 4"  (1.626 m)    LMP 07/17/2012    SpO2 100%    BMI 62.22 kg/m    General: Awake, no distress.  CV:  Good peripheral perfusion.  Resp:  Normal effort.  Faint bibasilar rales. Abd:  No distention.  Other:     ED Results / Procedures / Treatments  Labs (all labs ordered are listed, but only abnormal results are displayed) Labs Reviewed  CBC - Abnormal; Notable for the following components:      Result Value   WBC 16.6 (*)    RBC 3.21 (*)    Hemoglobin 8.8 (*)    HCT 30.0 (*)    MCHC 29.3 (*)    RDW 15.7 (*)    All other components within normal limits  BASIC METABOLIC PANEL - Abnormal; Notable for the following components:   Glucose, Bld 128 (*)    BUN 136 (*)    Creatinine, Ser 5.82 (*)    GFR, Estimated 8 (*)    All other components within normal limits  BRAIN NATRIURETIC PEPTIDE - Abnormal; Notable for the following components:   B Natriuretic Peptide 375.4 (*)    All other components within normal limits  RESP PANEL BY RT-PCR (FLU A&B, COVID) ARPGX2  TROPONIN I (HIGH SENSITIVITY)  TROPONIN I (HIGH SENSITIVITY)     EKG  ED ECG REPORT I, Salsabeel Gorelick J, the attending physician, personally viewed and interpreted this ECG.   Date: 02/23/2021  EKG Time: 1728  Rate: 69  Rhythm: atrial fibrillation, rate 69  Axis: Normal  Intervals:none  ST&T Change: Nonspecific    RADIOLOGY I have personally reviewed patient's chest x-ray as well as the radiology interpretation:  Chest x-ray: Cardiomegaly, resolving atelectasis/pneumonia  Official radiology report(s): DG Chest 2 View  Result Date: 02/23/2021 CLINICAL DATA:  Shortness  of breath EXAM: CHEST - 2 VIEW COMPARISON:  Previous studies including the examination of 02/08/2021 FINDINGS: Transverse diameter of heart is increased. There is subtle prominence of interstitial markings in the  parahilar regions. There is interval decrease in patchy linear densities in the parahilar regions and lower lung fields. There is no new focal pulmonary consolidation. Lateral CP angles are essentially clear. There is no pneumothorax. IMPRESSION: Cardiomegaly. Central pulmonary vessels are prominent. There is subtle prominence of interstitial markings in the parahilar regions, more so on the right side, possibly suggesting mild asymmetric interstitial pulmonary edema. There is interval decrease in patchy infiltrates in the parahilar regions and lower lung fields suggesting resolving atelectasis/pneumonia. Electronically Signed   By: Elmer Picker M.D.   On: 02/23/2021 18:13     PROCEDURES:  Critical Care performed: No  .1-3 Lead EKG Interpretation Performed by: Paulette Blanch, MD Authorized by: Paulette Blanch, MD     Interpretation: normal     ECG rate:  85   ECG rate assessment: normal     Rhythm: sinus rhythm     Ectopy: none     Conduction: normal   Comments:     Patient placed on cardiac monitor to evaluate for arrthymias   MEDICATIONS ORDERED IN ED: Medications - No data to display   IMPRESSION / MDM / [place] / ED COURSE  I reviewed the triage vital signs and the nursing notes.                             56 year old female presenting with weakness and increasing shortness of breath. Differential includes, but is not limited to, viral syndrome, bronchitis including COPD exacerbation, pneumonia, reactive airway disease including asthma, CHF including exacerbation with or without pulmonary/interstitial edema, pneumothorax, ACS, thoracic trauma, and pulmonary embolism.   I have personally reviewed patient's hospital records from 02/09/2021-02/23/2020 as well as her heart failure clinic visit from 02/23/2021.  The patient is on the cardiac monitor to evaluate for evidence of arrhythmia and/or significant heart rate changes.  Laboratory results demonstrate worsening  renal function compared to just 1 day ago.  Chest x-ray demonstrates resolving atelectasis/pneumonia.  Likely patient will need to go on dialysis during this hospitalization.  Will consult and discussed with hospitalist services for evaluation in the emergency department for admission.    FINAL CLINICAL IMPRESSION(S) / ED DIAGNOSES   Final diagnoses:  Cardiorenal syndrome with renal failure     Rx / DC Orders   ED Discharge Orders     None        Note:  This document was prepared using Dragon voice recognition software and may include unintentional dictation errors.   Paulette Blanch, MD 02/24/21 [phone]

## 2021-02-24 ENCOUNTER — Ambulatory Visit: Payer: BC Managed Care – PPO | Admitting: Family

## 2021-02-24 ENCOUNTER — Inpatient Hospital Stay
Admission: EM | Disposition: A | Discharge status: Other Acute Inpatient Hospital | Payer: Self-pay | Source: Home / Self Care | Attending: Internal Medicine

## 2021-02-24 DIAGNOSIS — N179 Acute kidney failure, unspecified: Secondary | ICD-10-CM | POA: Diagnosis present

## 2021-02-24 DIAGNOSIS — N185 Chronic kidney disease, stage 5: Secondary | ICD-10-CM

## 2021-02-24 DIAGNOSIS — N186 End stage renal disease: Secondary | ICD-10-CM

## 2021-02-24 DIAGNOSIS — I5033 Acute on chronic diastolic (congestive) heart failure: Secondary | ICD-10-CM | POA: Diagnosis not present

## 2021-02-24 DIAGNOSIS — Z992 Dependence on renal dialysis: Secondary | ICD-10-CM

## 2021-02-24 DIAGNOSIS — N170 Acute kidney failure with tubular necrosis: Secondary | ICD-10-CM | POA: Diagnosis not present

## 2021-02-24 DIAGNOSIS — N184 Chronic kidney disease, stage 4 (severe): Secondary | ICD-10-CM | POA: Diagnosis not present

## 2021-02-24 DIAGNOSIS — Z6841 Body Mass Index (BMI) 40.0 and over, adult: Secondary | ICD-10-CM

## 2021-02-24 HISTORY — PX: DIALYSIS/PERMA CATHETER INSERTION: CATH118288

## 2021-02-24 LAB — URINALYSIS, ROUTINE W REFLEX MICROSCOPIC: Specific Gravity, Urine: 1.014 (ref 1.005–1.030)

## 2021-02-24 LAB — BASIC METABOLIC PANEL
Anion gap: 13 (ref 5–15)
BUN: 154 mg/dL — ABNORMAL HIGH (ref 6–20)
CO2: 24 mmol/L (ref 22–32)
Calcium: 9.2 mg/dL (ref 8.9–10.3)
Chloride: 108 mmol/L (ref 98–111)
Creatinine, Ser: 6 mg/dL — ABNORMAL HIGH (ref 0.44–1.00)
GFR, Estimated: 8 mL/min — ABNORMAL LOW (ref >60)
Glucose, Bld: 174 mg/dL — ABNORMAL HIGH (ref 70–99)
Potassium: 5.1 mmol/L (ref 3.5–5.1)
Sodium: 145 mmol/L (ref 135–145)

## 2021-02-24 LAB — CBC
HCT: 31.2 % — ABNORMAL LOW (ref 36.0–46.0)
Hemoglobin: 9.3 g/dL — ABNORMAL LOW (ref 12.0–15.0)
MCH: 27.8 pg (ref 26.0–34.0)
MCHC: 29.8 g/dL — ABNORMAL LOW (ref 30.0–36.0)
MCV: 93.4 fL (ref 80.0–100.0)
Platelets: 254 10*3/uL (ref 150–400)
RBC: 3.34 MIL/uL — ABNORMAL LOW (ref 3.87–5.11)
RDW: 15.7 % — ABNORMAL HIGH (ref 11.5–15.5)
WBC: 15.7 10*3/uL — ABNORMAL HIGH (ref 4.0–10.5)
nRBC: 0.2 % (ref 0.0–0.2)

## 2021-02-24 LAB — URINALYSIS, MICROSCOPIC (REFLEX)
Bacteria, UA: NONE SEEN
RBC / HPF: >50 RBC/hpf (ref 0–5)
Squamous Epithelial / HPF: NONE SEEN (ref 0–5)
WBC, UA: >50 WBC/hpf (ref 0–5)

## 2021-02-24 LAB — GLUCOSE, CAPILLARY: Glucose-Capillary: 85 mg/dL (ref 70–99)

## 2021-02-24 LAB — CBG MONITORING, ED
Glucose-Capillary: 158 mg/dL — ABNORMAL HIGH (ref 70–99)
Glucose-Capillary: 164 mg/dL — ABNORMAL HIGH (ref 70–99)
Glucose-Capillary: 262 mg/dL — ABNORMAL HIGH (ref 70–99)

## 2021-02-24 SURGERY — DIALYSIS/PERMA CATHETER INSERTION
Anesthesia: Moderate Sedation

## 2021-02-24 MED ORDER — METOPROLOL TARTRATE 50 MG PO TABS
100.0000 mg | ORAL_TABLET | Freq: Two times a day (BID) | ORAL | Status: DC
  Administered 2021-02-24 – 2021-03-04 (×16): 100 mg via ORAL
  Filled 2021-02-24 (×16): qty 2

## 2021-02-24 MED ORDER — VITAMIN D (ERGOCALCIFEROL) 1.25 MG (50000 UNIT) PO CAPS
50000.0000 [IU] | ORAL_CAPSULE | ORAL | Status: DC
  Administered 2021-02-25 – 2021-03-04 (×2): 50000 [IU] via ORAL
  Filled 2021-02-24 (×2): qty 1

## 2021-02-24 MED ORDER — FOLIC ACID 1 MG PO TABS
1000.0000 ug | ORAL_TABLET | Freq: Every day | ORAL | Status: DC
  Administered 2021-02-24 – 2021-03-04 (×8): 1 mg via ORAL
  Filled 2021-02-24 (×8): qty 1

## 2021-02-24 MED ORDER — SODIUM CHLORIDE 0.9 % IV SOLN: INTRAVENOUS | Status: DC

## 2021-02-24 MED ORDER — CEFAZOLIN SODIUM-DEXTROSE 2-4 GM/100ML-% IV SOLN
2.0000 g | INTRAVENOUS | Status: DC
  Filled 2021-02-24: qty 100

## 2021-02-24 MED ORDER — CALCITRIOL 0.25 MCG PO CAPS
0.2500 ug | ORAL_CAPSULE | Freq: Every day | ORAL | Status: DC
  Administered 2021-02-24 – 2021-03-04 (×8): 0.25 ug via ORAL
  Filled 2021-02-24 (×9): qty 1

## 2021-02-24 MED ORDER — AMLODIPINE BESYLATE 5 MG PO TABS
5.0000 mg | ORAL_TABLET | Freq: Two times a day (BID) | ORAL | Status: DC
  Administered 2021-02-24 – 2021-03-04 (×16): 5 mg via ORAL
  Filled 2021-02-24 (×16): qty 1

## 2021-02-24 MED ORDER — ONDANSETRON HCL 4 MG PO TABS: 4.0000 mg | ORAL_TABLET | Freq: Four times a day (QID) | ORAL | Status: DC | PRN

## 2021-02-24 MED ORDER — APIXABAN 5 MG PO TABS
5.0000 mg | ORAL_TABLET | Freq: Two times a day (BID) | ORAL | Status: DC
  Administered 2021-02-24 – 2021-03-04 (×16): 5 mg via ORAL
  Filled 2021-02-24 (×16): qty 1

## 2021-02-24 MED ORDER — MIDAZOLAM HCL 2 MG/2ML IJ SOLN
INTRAMUSCULAR | Status: AC
  Filled 2021-02-24: qty 2

## 2021-02-24 MED ORDER — CEFAZOLIN SODIUM-DEXTROSE 1-4 GM/50ML-% IV SOLN: 1.0000 g | INTRAVENOUS | Status: DC

## 2021-02-24 MED ORDER — ALBUTEROL SULFATE (2.5 MG/3ML) 0.083% IN NEBU: 2.5000 mg | INHALATION_SOLUTION | Freq: Four times a day (QID) | RESPIRATORY_TRACT | Status: DC | PRN

## 2021-02-24 MED ORDER — ONDANSETRON HCL 4 MG/2ML IJ SOLN: 4.0000 mg | Freq: Four times a day (QID) | INTRAMUSCULAR | Status: DC | PRN

## 2021-02-24 MED ORDER — LEVOTHYROXINE SODIUM 112 MCG PO TABS
112.0000 ug | ORAL_TABLET | Freq: Every day | ORAL | Status: DC
  Administered 2021-02-24 – 2021-03-04 (×8): 112 ug via ORAL
  Filled 2021-02-24 (×10): qty 1

## 2021-02-24 MED ORDER — CALCIUM CARBONATE 1250 (500 CA) MG PO TABS
500.0000 mg | ORAL_TABLET | Freq: Every day | ORAL | Status: DC
  Administered 2021-02-24 – 2021-03-04 (×7): 500 mg via ORAL
  Filled 2021-02-24 (×10): qty 1

## 2021-02-24 MED ORDER — INSULIN ASPART 100 UNIT/ML IJ SOLN
0.0000 [IU] | Freq: Three times a day (TID) | INTRAMUSCULAR | Status: DC
  Administered 2021-02-24: 2 [IU] via SUBCUTANEOUS
  Administered 2021-02-24: 22:00:00 5 [IU] via SUBCUTANEOUS
  Administered 2021-02-24 – 2021-02-25 (×3): 2 [IU] via SUBCUTANEOUS
  Administered 2021-02-25: 1 [IU] via SUBCUTANEOUS
  Administered 2021-02-26: 5 [IU] via SUBCUTANEOUS
  Administered 2021-02-26 – 2021-02-27 (×2): 2 [IU] via SUBCUTANEOUS
  Administered 2021-02-27: 1 [IU] via SUBCUTANEOUS
  Administered 2021-02-27: 2 [IU] via SUBCUTANEOUS
  Administered 2021-02-28: 3 [IU] via SUBCUTANEOUS
  Administered 2021-02-28: 1 [IU] via SUBCUTANEOUS
  Administered 2021-02-28: 2 [IU] via SUBCUTANEOUS
  Administered 2021-03-01 (×3): 1 [IU] via SUBCUTANEOUS
  Administered 2021-03-01: 2 [IU] via SUBCUTANEOUS
  Administered 2021-03-02: 1 [IU] via SUBCUTANEOUS
  Administered 2021-03-03: 2 [IU] via SUBCUTANEOUS
  Administered 2021-03-03 (×3): 1 [IU] via SUBCUTANEOUS
  Filled 2021-02-24 (×23): qty 1

## 2021-02-24 MED ORDER — MAGNESIUM HYDROXIDE 400 MG/5ML PO SUSP: 30.0000 mL | Freq: Every day | ORAL | Status: DC | PRN

## 2021-02-24 MED ORDER — CEFAZOLIN SODIUM-DEXTROSE 1-4 GM/50ML-% IV SOLN
INTRAVENOUS | Status: AC | PRN
  Administered 2021-02-24: 1 g via INTRAVENOUS

## 2021-02-24 MED ORDER — ACETAMINOPHEN 325 MG PO TABS
650.0000 mg | ORAL_TABLET | Freq: Four times a day (QID) | ORAL | Status: DC | PRN
  Administered 2021-02-26 – 2021-03-04 (×5): 650 mg via ORAL
  Filled 2021-02-24 (×5): qty 2

## 2021-02-24 MED ORDER — FENTANYL CITRATE (PF) 100 MCG/2ML IJ SOLN
INTRAMUSCULAR | Status: AC
  Filled 2021-02-24: qty 2

## 2021-02-24 MED ORDER — CLONIDINE HCL 0.1 MG PO TABS
0.2000 mg | ORAL_TABLET | Freq: Two times a day (BID) | ORAL | Status: DC
  Administered 2021-02-24 – 2021-03-04 (×16): 0.2 mg via ORAL
  Filled 2021-02-24 (×16): qty 2

## 2021-02-24 MED ORDER — CHLORHEXIDINE GLUCONATE CLOTH 2 % EX PADS
6.0000 | MEDICATED_PAD | Freq: Every day | CUTANEOUS | Status: DC
  Administered 2021-02-27 – 2021-03-04 (×5): 6 via TOPICAL
  Filled 2021-02-24 (×2): qty 6

## 2021-02-24 MED ORDER — ATORVASTATIN CALCIUM 20 MG PO TABS
20.0000 mg | ORAL_TABLET | Freq: Every day | ORAL | Status: DC
  Administered 2021-02-24 – 2021-03-03 (×8): 20 mg via ORAL
  Filled 2021-02-24 (×8): qty 1

## 2021-02-24 MED ORDER — DULAGLUTIDE 1.5 MG/0.5ML ~~LOC~~ SOAJ: 1.5000 mg | SUBCUTANEOUS | Status: DC

## 2021-02-24 MED ORDER — MIDAZOLAM HCL 2 MG/2ML IJ SOLN
INTRAMUSCULAR | Status: DC | PRN
  Administered 2021-02-24 (×2): .5 mg via INTRAVENOUS

## 2021-02-24 MED ORDER — LEVOTHYROXINE SODIUM 112 MCG PO TABS
112.0000 ug | ORAL_TABLET | Freq: Every day | ORAL | Status: DC
  Filled 2021-02-24: qty 1

## 2021-02-24 MED ORDER — ACETAMINOPHEN 650 MG RE SUPP: 650.0000 mg | Freq: Four times a day (QID) | RECTAL | Status: DC | PRN

## 2021-02-24 MED ORDER — MELATONIN 5 MG PO TABS
10.0000 mg | ORAL_TABLET | Freq: Every evening | ORAL | Status: DC | PRN
  Administered 2021-02-25 – 2021-03-03 (×4): 10 mg via ORAL
  Filled 2021-02-24 (×4): qty 2

## 2021-02-24 MED ORDER — TRAZODONE HCL 50 MG PO TABS
25.0000 mg | ORAL_TABLET | Freq: Every evening | ORAL | Status: DC | PRN
  Administered 2021-02-25 – 2021-03-01 (×5): 25 mg via ORAL
  Filled 2021-02-24 (×6): qty 1

## 2021-02-24 MED ORDER — FENTANYL CITRATE (PF) 100 MCG/2ML IJ SOLN
INTRAMUSCULAR | Status: DC | PRN
  Administered 2021-02-24: 25 ug via INTRAVENOUS

## 2021-02-24 MED ORDER — MORPHINE SULFATE (PF) 2 MG/ML IV SOLN
2.0000 mg | INTRAVENOUS | Status: DC | PRN
  Administered 2021-02-24 – 2021-03-03 (×5): 2 mg via INTRAVENOUS
  Filled 2021-02-24 (×6): qty 1

## 2021-02-24 MED ORDER — ALBUTEROL SULFATE HFA 108 (90 BASE) MCG/ACT IN AERS: 2.0000 | INHALATION_SPRAY | Freq: Four times a day (QID) | RESPIRATORY_TRACT | Status: DC | PRN

## 2021-02-24 SURGICAL SUPPLY — 9 items
ADH SKN CLS APL DERMABOND .7 (GAUZE/BANDAGES/DRESSINGS) ×1
BIOPATCH RED 1 DISK 7.0 (GAUZE/BANDAGES/DRESSINGS) ×1 IMPLANT
CATH CANNON HEMO 15FR 19 (HEMODIALYSIS SUPPLIES) ×1 IMPLANT
COVER PROBE U/S 5X48 (MISCELLANEOUS) ×1 IMPLANT
DERMABOND ADVANCED (GAUZE/BANDAGES/DRESSINGS) ×1
DERMABOND ADVANCED .7 DNX12 (GAUZE/BANDAGES/DRESSINGS) IMPLANT
PACK ANGIOGRAPHY (CUSTOM PROCEDURE TRAY) ×1 IMPLANT
SUT MNCRL AB 4-0 PS2 18 (SUTURE) ×1 IMPLANT
SUT PROLENE 0 CT 1 30 (SUTURE) ×1 IMPLANT

## 2021-02-24 NOTE — ED Notes (Signed)
Pt given sandwich tray and cup of water at this time per request.

## 2021-02-24 NOTE — Progress Notes (Signed)
PROGRESS NOTE    Nicole Schmidt  UVO:[id number] DOB: 26-Nov-1965 DOA: 02/23/2021 PCP: Lenard Simmer, MD   Chief complaint.  Shortness of breath. Brief Narrative:  Nicole Schmidt is a 56 y.o. Caucasian female with medical history significant for type 2 diabetes mellitus, hypertension, dyslipidemia, hypothyroidism, gout, HFpEF, atrial fibrillation and PE on Eliquis, who had a recent admission here from 02/01/21 till 02/22/21 during which she was managed for acute on chronic diastolic CHF and AKI superimposed on CKD stage III.  She presents to the ER with worsening lower extremity edema as well as increased dyspnea since yesterday.    Assessment & Plan:   Principal Problem:   Acute on chronic renal failure (HCC) Active Problems:   Acute renal failure superimposed on stage 4 chronic kidney disease (HCC)   Acute on chronic diastolic CHF (congestive heart failure) ([place])   Morbid obesity with BMI of 60.0-69.9, adult ([place])   Diabetes mellitus type 2 in obese ([place])   Chronic respiratory failure with hypoxia ([place])  Acute kidney injury superimposed on stage IV chronic kidney disease. Acute on chronic diastolic congestive heart failure with volume overload. I reviewed the patient chart, patient baseline renal function was stage IV.  Worsening renal function since last admission.  Patient also has evidence of volume overload. I reviewed echocardiogram performed at the last admission, ejection fraction was normal.  There is diastolic dysfunction. Patient received IV Lasix. Nephrology has been consulted, requested dialysis catheter home from vascular surgery.  Chronic hypoxemic respiratory failure Morbid obesity. Patient currently on 2 L oxygen, which is her baseline.  Paroxysmal atrial fibrillation. Continue Eliquis.  Essential hypertension. Continue home medicines    DVT prophylaxis: Eliquis Code Status: full Family Communication:  Disposition Plan:    Status is:  Inpatient  Remains inpatient appropriate because: Severity of disease, pending procedures.        No intake/output data recorded. No intake/output data recorded.     Consultants:  Nephrology.  Procedures: Pending  Antimicrobials: None  Subjective: Patient still has significant short of breath with exertion, currently on 2 L oxygen which is baseline. No cough. No fever or chills No abdominal pain nausea vomiting, no diarrhea constipation. No dysuria hematuria.  Objective: Vitals:   02/23/21 2127 02/24/21 0403 02/24/21 0834 02/24/21 1138  BP: (!) 149/54 131/65 (!) 144/56 133/69  Pulse: 77 78 92   Resp: 20 20 18    Temp: 97.8 F (36.6 C)   98.3 F (36.8 C)  TempSrc: Oral   Oral  SpO2: 100% 97% 94%   Height:       No intake or output data in the 24 hours ending 02/24/21 1212 There were no vitals filed for this visit.  Examination:  General exam: Appears calm and comfortable, morbid obese. Respiratory system: Diminished breathing sounds. Respiratory effort normal. Cardiovascular system: S1 & S2 heard, RRR. No JVD, murmurs, rubs, gallops or clicks.  Gastrointestinal system: Abdomen is nondistended, soft and nontender. No organomegaly or masses felt. Normal bowel sounds heard. Central nervous system: Alert and oriented. No focal neurological deficits. Extremities: 2+ leg edema Skin: No rashes, lesions or ulcers Psychiatry: Judgement and insight appear normal. Mood & affect appropriate.     Data Reviewed: I have personally reviewed following labs and imaging studies  CBC: Recent Labs  Lab 02/20/21 0521 02/21/21 0417 02/23/21 1721 02/24/21 0830  WBC 14.6* 14.7* 16.6* 15.7*  HGB 9.2* 9.4* 8.8* 9.3*  HCT 30.8* 31.9* 30.0* 31.2*  MCV 90.6 92.2 93.5 93.4  PLT 251 247 262 194   Basic Metabolic Panel: Recent Labs  Lab 02/18/21 0603 02/19/21 0439 02/20/21 0521 02/21/21 0417 02/22/21 0445 02/23/21 1721 02/24/21 0830  NA 140 142 141 143 139 140 145  K  5.0 5.2* 5.2* 5.0 4.9 5.0 5.1  CL 104 104 106 108 104 104 108  CO2 24 23 25 24 23 22 24   GLUCOSE 139* 145* 135* 159* 143* 128* 174*  BUN 138* 123* 120* 107* 145* 136* 154*  CREATININE 4.99* 4.99* 5.12* 5.23* 5.24* 5.82* 6.00*  CALCIUM 9.9 9.9 9.9 9.9 9.5 9.4 9.2  PHOS 8.3* 8.1* 7.9* 8.1*  --   --   --    GFR: Estimated Creatinine Clearance: 16.1 mL/min (A) (by C-G formula based on SCr of 6 mg/dL (H)). Liver Function Tests: Recent Labs  Lab 02/18/21 0603 02/19/21 0439 02/20/21 0521 02/21/21 0417  ALBUMIN 3.2* 3.1* 2.8* 2.9*   No results for input(s): LIPASE, AMYLASE in the last 168 hours. No results for input(s): AMMONIA in the last 168 hours. Coagulation Profile: No results for input(s): INR, PROTIME in the last 168 hours. Cardiac Enzymes: No results for input(s): CKTOTAL, CKMB, CKMBINDEX, TROPONINI in the last 168 hours. BNP (last 3 results) No results for input(s): PROBNP in the last 8760 hours. HbA1C: No results for input(s): HGBA1C in the last 72 hours. CBG: Recent Labs  Lab 02/21/21 1819 02/22/21 0825 02/22/21 1159 02/24/21 0816 02/24/21 1121  GLUCAP 103* 131* 175* 158* 164*   Lipid Profile: No results for input(s): CHOL, HDL, LDLCALC, TRIG, CHOLHDL, LDLDIRECT in the last 72 hours. Thyroid Function Tests: No results for input(s): TSH, T4TOTAL, FREET4, T3FREE, THYROIDAB in the last 72 hours. Anemia Panel: No results for input(s): VITAMINB12, FOLATE, FERRITIN, TIBC, IRON, RETICCTPCT in the last 72 hours. Sepsis Labs: No results for input(s): PROCALCITON, LATICACIDVEN in the last 168 hours.  Recent Results (from the past 240 hour(s))  Resp Panel by RT-PCR (Flu A&B, Covid) Nasopharyngeal Swab     Status: None   Collection Time: 02/23/21  9:29 PM   Specimen: Nasopharyngeal Swab; Nasopharyngeal(NP) swabs in vial transport medium  Result Value Ref Range Status   SARS Coronavirus 2 by RT PCR NEGATIVE NEGATIVE Final    Comment: (NOTE) SARS-CoV-2 target nucleic  acids are NOT DETECTED.  The SARS-CoV-2 RNA is generally detectable in upper respiratory specimens during the acute phase of infection. The lowest concentration of SARS-CoV-2 viral copies this assay can detect is 138 copies/mL. A negative result does not preclude SARS-Cov-2 infection and should not be used as the sole basis for treatment or other patient management decisions. A negative result may occur with  improper specimen collection/handling, submission of specimen other than nasopharyngeal swab, presence of viral mutation(s) within the areas targeted by this assay, and inadequate number of viral copies(<138 copies/mL). A negative result must be combined with clinical observations, patient history, and epidemiological information. The expected result is Negative.  Fact Sheet for Patients:  EntrepreneurPulse.com.au  Fact Sheet for Healthcare Providers:  IncredibleEmployment.be  This test is no t yet approved or cleared by the [place] FDA and  has been authorized for detection and/or diagnosis of SARS-CoV-2 by FDA under an Emergency Use Authorization (EUA). This EUA will remain  in effect (meaning this test can be used) for the duration of the COVID-19 declaration under Section 564(b)(1) of the Act, 21 U.S.C.section 360bbb-3(b)(1), unless the authorization is terminated  or revoked sooner.       Influenza A by PCR NEGATIVE  NEGATIVE Final   Influenza B by PCR NEGATIVE NEGATIVE Final    Comment: (NOTE) The Xpert Xpress SARS-CoV-2/FLU/RSV plus assay is intended as an aid in the diagnosis of influenza from Nasopharyngeal swab specimens and should not be used as a sole basis for treatment. Nasal washings and aspirates are unacceptable for Xpert Xpress SARS-CoV-2/FLU/RSV testing.  Fact Sheet for Patients: EntrepreneurPulse.com.au  Fact Sheet for Healthcare Providers: IncredibleEmployment.be  This  test is not yet approved or cleared by the [place] FDA and has been authorized for detection and/or diagnosis of SARS-CoV-2 by FDA under an Emergency Use Authorization (EUA). This EUA will remain in effect (meaning this test can be used) for the duration of the COVID-19 declaration under Section 564(b)(1) of the Act, 21 U.S.C. section 360bbb-3(b)(1), unless the authorization is terminated or revoked.  Performed at [facility], [address] [place], [place] [address]          Radiology Studies: DG Chest 2 View  Result Date: 02/23/2021 CLINICAL DATA:  Shortness of breath EXAM: CHEST - 2 VIEW COMPARISON:  Previous studies including the examination of 02/08/2021 FINDINGS: Transverse diameter of heart is increased. There is subtle prominence of interstitial markings in the parahilar regions. There is interval decrease in patchy linear densities in the parahilar regions and lower lung fields. There is no new focal pulmonary consolidation. Lateral CP angles are essentially clear. There is no pneumothorax. IMPRESSION: Cardiomegaly. Central pulmonary vessels are prominent. There is subtle prominence of interstitial markings in the parahilar regions, more so on the right side, possibly suggesting mild asymmetric interstitial pulmonary edema. There is interval decrease in patchy infiltrates in the parahilar regions and lower lung fields suggesting resolving atelectasis/pneumonia. Electronically Signed   By: Elmer Picker M.D.   On: 02/23/2021 18:13        Scheduled Meds:  amLODipine  5 mg Oral BID   apixaban  5 mg Oral BID   atorvastatin  20 mg Oral QHS   calcitRIOL  0.25 mcg Oral Daily   calcium carbonate  500 mg of elemental calcium Oral Q breakfast   cloNIDine  0.2 mg Oral BID   Dulaglutide  1.5 mg Subcutaneous Q7 days   folic acid  9,470 mcg Oral Daily   insulin aspart  0-9 Units Subcutaneous TID AC & HS   levothyroxine  112 mcg Oral QAC breakfast   metoprolol  tartrate  100 mg Oral BID   [START ON 02/25/2021] Vitamin D (Ergocalciferol)  50,000 Units Oral Q Fri   Continuous Infusions:   LOS: 0 days    Time spent: 34 minutes    Sharen Hones, MD [facility]   To contact the attending provider between 7A-7P or the covering provider during after hours 7P-7A, please log into the web site [email or web address] and access using universal [place] password for that web site. If you do not have the password, please call the hospital operator.  02/24/2021, 12:12 PM

## 2021-02-24 NOTE — ED Notes (Signed)
Pt speaking on phone. Pt updated. Belongings with pt. Back pack, belongings bag, and O2 tank (double).

## 2021-02-24 NOTE — ED Notes (Signed)
Arousable to voice/ spontaneous. C/o R side/flank pain. Endorses need to void. Assisted to b/r. Remains on O2 [place] 2L.

## 2021-02-24 NOTE — ED Notes (Addendum)
Pt ambulated with one staff assist to restroom. Pt returned safely to recliner.

## 2021-02-24 NOTE — Consult Note (Signed)
[facility] Vascular Consult Note  MRN : [id number]  Nicole Schmidt is a 56 y.o. (Sep 12, 1965) female who presents with chief complaint of  Chief Complaint  Patient presents with   Shortness of Breath  .  History of Present Illness: I am asked to see the patient by Dr. Holley Raring for placement of a dialysis access.  The patient has had chronic kidney disease for 3 to 4 years, but has now progressed to respiratory insufficiency and needs dialysis at this time.  We are asked to place PermCath.  The patient has been sitting up and has difficulty laying flat.  She has shortness of breath at rest.  No current chest pain.  She does have leg swelling as well.  No fevers or chills  Current Facility-Administered Medications  Medication Dose Route Frequency Provider Last Rate Last Admin   0.9 %  sodium chloride infusion   Intravenous Continuous Britainy Kozub, Erskine Squibb, MD       acetaminophen (TYLENOL) tablet 650 mg  650 mg Oral Q6H PRN Mansy, Jan A, MD       Or   acetaminophen (TYLENOL) suppository 650 mg  650 mg Rectal Q6H PRN Mansy, Jan A, MD       albuterol (PROVENTIL) (2.5 MG/3ML) 0.083% nebulizer solution 2.5 mg  2.5 mg Nebulization Q6H PRN Renda Rolls, RPH       amLODipine (NORVASC) tablet 5 mg  5 mg Oral BID Mansy, Jan A, MD   5 mg at 02/24/21 0858   apixaban (ELIQUIS) tablet 5 mg  5 mg Oral BID Mansy, Jan A, MD   5 mg at 02/24/21 0859   atorvastatin (LIPITOR) tablet 20 mg  20 mg Oral QHS Mansy, Jan A, MD       calcitRIOL (ROCALTROL) capsule 0.25 mcg  0.25 mcg Oral Daily Mansy, Jan A, MD   0.25 mcg at 02/24/21 8185   calcium carbonate (OS-CAL - dosed in mg of elemental calcium) tablet 500 mg of elemental calcium  500 mg of elemental calcium Oral Q breakfast Mansy, Jan A, MD   500 mg of elemental calcium at 02/24/21 0859   ceFAZolin (ANCEF) IVPB 2g/100 mL premix  2 g Intravenous 30 min Pre-Op Algernon Huxley, MD       cloNIDine (CATAPRES) tablet 0.2 mg  0.2 mg Oral BID Mansy, Jan A, MD    0.2 mg at 02/24/21 0858   Dulaglutide SOPN 1.5 mg  1.5 mg Subcutaneous Q7 days Mansy, Jan A, MD       folic acid (FOLVITE) tablet 1 mg  1,000 mcg Oral Daily Mansy, Jan A, MD   1 mg at 02/24/21 0956   insulin aspart (novoLOG) injection 0-9 Units  0-9 Units Subcutaneous TID [facility] Mansy, Arvella Merles, MD   2 Units at 02/24/21 1139   levothyroxine (SYNTHROID) tablet 112 mcg  112 mcg Oral QAC breakfast Mansy, Jan A, MD   112 mcg at 02/24/21 0955   magnesium hydroxide (MILK OF MAGNESIA) suspension 30 mL  30 mL Oral Daily PRN Mansy, Jan A, MD       melatonin tablet 10 mg  10 mg Oral QHS PRN Mansy, Jan A, MD       metoprolol tartrate (LOPRESSOR) tablet 100 mg  100 mg Oral BID Mansy, Jan A, MD   100 mg at 02/24/21 0858   morphine 2 MG/ML injection 2 mg  2 mg Intravenous Q4H PRN Mansy, Arvella Merles, MD   2 mg  at 02/24/21 0520   ondansetron (ZOFRAN) tablet 4 mg  4 mg Oral Q6H PRN Mansy, Jan A, MD       Or   ondansetron [facility]) injection 4 mg  4 mg Intravenous Q6H PRN Mansy, Jan A, MD       traZODone (DESYREL) tablet 25 mg  25 mg Oral QHS PRN Mansy, Arvella Merles, MD       [START ON 02/25/2021] Vitamin D (Ergocalciferol) (DRISDOL) capsule 50,000 Units  50,000 Units Oral Q Fri Mansy, Jan A, MD       Current Outpatient Medications  Medication Sig Dispense Refill   acetaminophen (TYLENOL) 500 MG tablet Take 1,000 mg by mouth every 6 (six) hours as needed (for pain).     albuterol (VENTOLIN HFA) 108 (90 Base) MCG/ACT inhaler Inhale 2 puffs into the lungs every 6 (six) hours as needed for wheezing or shortness of breath.     amLODipine (NORVASC) 5 MG tablet Take 5 mg by mouth 2 (two) times daily.     atorvastatin (LIPITOR) 20 MG tablet Take 20 mg by mouth at bedtime.      calcitRIOL (ROCALTROL) 0.25 MCG capsule Take 0.25 mcg by mouth daily.     calcium carbonate (OSCAL) 1500 (600 Ca) MG TABS tablet Take 600 mg of elemental calcium by mouth daily.      Cholecalciferol (VITAMIN D3) 50000 units TABS Take 50,000 Units by mouth  every Friday.      cloNIDine (CATAPRES) 0.2 MG tablet Take 1 tablet (0.2 mg total) by mouth 2 (two) times daily. 60 tablet 0   Dulaglutide 1.5 MG/0.5ML SOPN Inject 1.5 mg into the skin every 7 (seven) days.     ELIQUIS 5 MG TABS tablet TAKE 1 TABLET TWICE A DAY 180 tablet 3   levothyroxine (SYNTHROID, LEVOTHROID) 112 MCG tablet Take 112 mcg daily before breakfast by mouth.     Melatonin 10 MG TABS Take 10 mg by mouth at bedtime.     metoprolol tartrate (LOPRESSOR) 100 MG tablet Take 1 tablet (100 mg total) by mouth 2 (two) times daily. 60 tablet 0   folic acid (FOLVITE) 242 MCG tablet Take 800 mcg by mouth daily.  (Patient not taking: Reported on 02/24/2021)     furosemide (LASIX) 40 MG tablet Take 40 mg by mouth daily. (Patient not taking: Reported on 02/24/2021)      Past Medical History:  Diagnosis Date   (HFpEF) heart failure with preserved ejection fraction ([place])    a. 10/2018 Echo: EF 60-65%, diast dysfxn, RVSP 50.7 mmHg, mildly dil LA.   Acquired hypothyroidism 02/09/2020   Anemia    Arthritis    Breast cancer ([place]) 2014   right breast cancer   Breast cancer of upper-inner quadrant of right female breast ([place])    Right breast invasive CA and DCIS , 7 mm T1,N0,M0. Er/PR pos, her 2 negative.  Margins 1 mm.   CHF (congestive heart failure) (HCC)    CKD (chronic kidney disease), stage III (HCC)    Diabetes mellitus type 2 in obese ([place]) 02/09/2020   Diabetes mellitus without complication ([place])    Dyslipidemia 02/09/2020   Essential hypertension    Gout    Hypothyroidism    Menopause    age 89   Morbid obesity ([place])    Persistent atrial fibrillation ([place])    a. Dx 10/2018 in setting of PNA. CHA2DS2VASc = 4-->Eliquis; b. 11/2018 s/p successful DCCV.   Personal history of radiation therapy    Pulmonary  embolism ([place]) 10/2014   a. Chronic eliquis.   Thyroid goiter     Past Surgical History:  Procedure Laterality Date   BREAST BIOPSY Right 2014   breast ca   BREAST EXCISIONAL  BIOPSY Right 07/19/2012   breast ca   BREAST SURGERY Right 2014   with sentinel node bx subareaolar duct excision   CARDIOVERSION N/A 11/28/2018   Procedure: CARDIOVERSION;  Surgeon: Minna Merritts, MD;  Location: [facility] ORS;  Service: Cardiovascular;  Laterality: N/A;   COLONOSCOPY WITH PROPOFOL N/A 01/30/2017   Procedure: COLONOSCOPY WITH PROPOFOL;  Surgeon: Christene Lye, MD;  Location: [facility] ENDOSCOPY;  Service: Endoscopy;  Laterality: N/A;   CYSTOSCOPY W/ URETERAL STENT PLACEMENT Left 02/14/2021   Procedure: CYSTOSCOPY WITH RETROGRADE PYELOGRAM/URETERAL STENT PLACEMENT;  Surgeon: Billey Co, MD;  Location: [facility] ORS;  Service: Urology;  Laterality: Left;     Social History   Tobacco Use   Smoking status: Never   Smokeless tobacco: Never  Vaping Use   Vaping Use: Never used  Substance Use Topics   Alcohol use: No    Alcohol/week: 0.0 standard drinks   Drug use: No     Family History  Problem Relation Age of Onset   Diabetes Father    Hypertension Father    Parkinson's disease Father    Hypertension Mother    Rheum arthritis Mother    Atrial fibrillation Mother    Heart failure Mother    Heart attack Mother        died in her mid-70's.   Colon cancer Cousin 54   Breast cancer Neg Hx     No Known Allergies   REVIEW OF SYSTEMS (Negative unless checked)  Constitutional: [] Weight loss  [] Fever  [] Chills Cardiac: [] Chest pain   [] Chest pressure   [x] Palpitations   [x] Shortness of breath when laying flat   [x] Shortness of breath at rest   [x] Shortness of breath with exertion. Vascular:  [] Pain in legs with walking   [] Pain in legs at rest   [] Pain in legs when laying flat   [] Claudication   [] Pain in feet when walking  [] Pain in feet at rest  [] Pain in feet when laying flat   [] History of DVT   [] Phlebitis   [] Swelling in legs   [] Varicose veins   [] Non-healing ulcers Pulmonary:   [] Uses home oxygen   [] Productive cough   [] Hemoptysis   [] Wheeze  [] COPD    [] Asthma Neurologic:  [] Dizziness  [] Blackouts   [] Seizures   [] History of stroke   [] History of TIA  [] Aphasia   [] Temporary blindness   [] Dysphagia   [] Weakness or numbness in arms   [] Weakness or numbness in legs Musculoskeletal:  [x] Arthritis   [] Joint swelling   [] Joint pain   [] Low back pain Hematologic:  [] Easy bruising  [] Easy bleeding   [] Hypercoagulable state   [x] Anemic  [] Hepatitis Gastrointestinal:  [] Blood in stool   [] Vomiting blood  [x] Gastroesophageal reflux/heartburn   [] Difficulty swallowing. Genitourinary:  [x] Chronic kidney disease   [] Difficult urination  [] Frequent urination  [] Burning with urination   [] Blood in urine Skin:  [] Rashes   [] Ulcers   [] Wounds Psychological:  [] History of anxiety   [x]  History of major depression.  Physical Examination  Vitals:   02/24/21 1138 02/24/21 1300 02/24/21 1330 02/24/21 1430  BP: 133/69 137/70 128/71 (!) 108/58  Pulse:  70 74 74  Resp:  (!) 22 (!) 23 13  Temp: 98.3 F (36.8 C)     TempSrc: Oral  SpO2:  97% 97% 92%  Height:       Body mass index is 62.22 kg/m. Gen:  WD/WN, NAD.  Morbidly obese Head: [place]/AT, No temporalis wasting.  Ear/Nose/Throat: Hearing grossly intact, nares w/o erythema or drainage, oropharynx w/o Erythema/Exudate Eyes: Sclera non-icteric, conjunctiva clear Neck: Trachea midline.  No JVD.  Pulmonary: Diminished, respirations mildly labored at rest on supplemental oxygen Cardiac: Irregular Vascular:  Vessel Right Left  Radial Palpable Palpable                                   Musculoskeletal: M/S 5/5 throughout.  Extremities without ischemic changes.  No deformity or atrophy.  Moderate lower extremity edema Neurologic: Sensation grossly intact in extremities.  Symmetrical.  Speech is fluent. Motor exam as listed above. Psychiatric: Judgment intact, Mood & affect appropriate for pt's clinical situation. Dermatologic: No rashes or ulcers noted.  No cellulitis or open  wounds.      CBC Lab Results  Component Value Date   WBC 15.7 (H) 02/24/2021   HGB 9.3 (L) 02/24/2021   HCT 31.2 (L) 02/24/2021   MCV 93.4 02/24/2021   PLT 254 02/24/2021    BMET    Component Value Date/Time   NA 145 02/24/2021 0830   NA 144 02/27/2020 0931   NA 141 03/09/2014 1533   K 5.1 02/24/2021 0830   K 4.2 03/09/2014 1533   CL 108 02/24/2021 0830   CL 101 03/09/2014 1533   CO2 24 02/24/2021 0830   CO2 30 03/09/2014 1533   GLUCOSE 174 (H) 02/24/2021 0830   GLUCOSE 95 03/09/2014 1533   BUN 154 (H) 02/24/2021 0830   BUN 40 (H) 02/27/2020 0931   BUN 35 (H) 03/09/2014 1533   CREATININE 6.00 (H) 02/24/2021 0830   CREATININE 1.50 (H) 03/09/2014 1533   CALCIUM 9.2 02/24/2021 0830   CALCIUM 8.8 03/09/2014 1533   GFRNONAA 8 (L) 02/24/2021 0830   GFRNONAA 39 (L) 03/09/2014 1533   GFRNONAA 55 (L) 07/31/2013 1521   GFRAA 35 (L) 02/27/2020 0931   GFRAA 47 (L) 03/09/2014 1533   GFRAA >60 07/31/2013 1521   Estimated Creatinine Clearance: 16.1 mL/min (A) (by C-G formula based on SCr of 6 mg/dL (H)).  COAG Lab Results  Component Value Date   INR 1.1 02/15/2021   INR 1.35 01/12/2018   INR 1.27 11/16/2017    Radiology CT ABDOMEN PELVIS WO CONTRAST  Result Date: 02/12/2021 CLINICAL DATA:  Hydronephrosis. EXAM: CT ABDOMEN AND PELVIS WITHOUT CONTRAST TECHNIQUE: Multidetector CT imaging of the abdomen and pelvis was performed following the standard protocol without IV contrast. COMPARISON:  February 09, 2021.  February 09, 2020. FINDINGS: Lower chest: Bibasilar subsegmental atelectasis is noted. Hepatobiliary: No focal liver abnormality is seen. No gallstones, gallbladder wall thickening, or biliary dilatation. Pancreas: Unremarkable. No pancreatic ductal dilatation or surrounding inflammatory changes. Spleen: Normal in size without focal abnormality. Adrenals/Urinary Tract: Stable 2.5 cm left adrenal nodule is noted. Right adrenal gland appears normal. Mild left  hydronephrosis is noted secondary to 7 mm calculus at left ureteropelvic junction. 1.9 cm hyperdense rounded abnormality is seen involving upper pole of right kidney which most likely corresponds to cyst described on recent ultrasound. Urinary bladder is decompressed. Stomach/Bowel: Stomach is within normal limits. Appendix appears normal. No evidence of bowel wall thickening, distention, or inflammatory changes. Vascular/Lymphatic: Aortic atherosclerosis. No enlarged abdominal or pelvic lymph nodes. Reproductive: Uterus and bilateral adnexa are  unremarkable. Other: No abdominal wall hernia or abnormality. No abdominopelvic ascites. Musculoskeletal: No acute or significant osseous findings. IMPRESSION: Mild left hydronephrosis is noted secondary to 7 mm calculus at the left ureteropelvic junction. Stable 2.5 cm left adrenal nodule is noted. Follow-up CT or MRI in 1 year is recommended to ensure stability. Aortic Atherosclerosis (ICD10-I70.0). Electronically Signed   By: Marijo Conception M.D.   On: 02/12/2021 16:40   DG Chest 2 View  Result Date: 02/23/2021 CLINICAL DATA:  Shortness of breath EXAM: CHEST - 2 VIEW COMPARISON:  Previous studies including the examination of 02/08/2021 FINDINGS: Transverse diameter of heart is increased. There is subtle prominence of interstitial markings in the parahilar regions. There is interval decrease in patchy linear densities in the parahilar regions and lower lung fields. There is no new focal pulmonary consolidation. Lateral CP angles are essentially clear. There is no pneumothorax. IMPRESSION: Cardiomegaly. Central pulmonary vessels are prominent. There is subtle prominence of interstitial markings in the parahilar regions, more so on the right side, possibly suggesting mild asymmetric interstitial pulmonary edema. There is interval decrease in patchy infiltrates in the parahilar regions and lower lung fields suggesting resolving atelectasis/pneumonia. Electronically Signed    By: Elmer Picker M.D.   On: 02/23/2021 18:13   DG Chest 2 View  Result Date: 02/08/2021 CLINICAL DATA:  Shortness of breath. Sneezing and scratchy throat since Friday. History of pneumonia. EXAM: CHEST - 2 VIEW COMPARISON:  03/16/2020 FINDINGS: Mild cardiac enlargement. Shallow inspiration. Developing linear infiltration in both mid and lower lung zones. Consider multifocal pneumonia versus atelectasis. Edema less likely to have this appearance. No pleural effusions. No pneumothorax. Mediastinal contours appear intact. Calcification of the aorta. Degenerative changes in the spine and shoulders. IMPRESSION: Developing infiltrates in both mid and lower lung zones may represent pneumonia versus atelectasis. Electronically Signed   By: Lucienne Capers M.D.   On: 02/08/2021 23:07   US RENAL  Result Date: 02/09/2021 CLINICAL DATA:  A 56 year old female presents for evaluation of renal failure EXAM: RENAL / URINARY TRACT ULTRASOUND COMPLETE COMPARISON:  Renal sonogram from 2019. FINDINGS: Right Kidney: Renal measurements: 11.0 x 6.0 x 5.0 cm = volume: 170 mL. Marked increased echogenicity. No hydronephrosis. Limited assessment due to patient body habitus. Small cyst in the RIGHT kidney measuring 1.5 x 1.2 x 1.2 cm. Left Kidney: Renal measurements: 13.5 x 6.2 x 5.2 cm = volume: 227 mL. Increased cortical echogenicity on the LEFT as well. Dilation of the LEFT renal pelvis and infundibula. Limited assessment due to patient body habitus. Trace perinephric fluid on the LEFT. Bladder: Appears normal for degree of bladder distention. Other: No ascites. IMPRESSION: Echogenic renal parenchyma, this could be seen in the setting of medical renal disease. Trace perinephric fluid on the LEFT could also be seen in this context. Mild LEFT hydronephrosis with dilation of renal pelvis and infundibular dilation, difficult to fully evaluate due to patient body habitus. Correlate with any LEFT-sided symptoms and with CT  imaging as warranted. Electronically Signed   By: Zetta Bills M.D.   On: 02/09/2021 13:27   DG OR UROLOGY CYSTO IMAGE ([place])  Result Date: 02/14/2021 There is no interpretation for this exam.  This order is for images obtained during a surgical procedure.  Please See "Surgeries" Tab for more information regarding the procedure.   ECHOCARDIOGRAM COMPLETE  Result Date: 02/10/2021    ECHOCARDIOGRAM REPORT   Patient Name:   Nicole Schmidt Date of Exam: 02/09/2021 Medical Rec #:  [id number]      Height:       64.0 in Accession #:    [id number]     Weight:       351.0 lb Date of Birth:  [birth year]       BSA:          2.485 m Patient Age:    30 years       BP:           136/71 mmHg Patient Gender: F              HR:           84 bpm. Exam Location:  [facility] Procedure: 2D Echo, Cardiac Doppler, Color Doppler and Intracardiac            Opacification Agent Indications:     Y19.50 Acute Diastolic CHF  History:         Patient has prior history of Echocardiogram examinations, most                  recent 02/09/2020. CHF, Arrythmias:Atrial Fibrillation; Risk                  Factors:Hypertension, Diabetes, Dyslipidemia and Morbid                  Obesity. Hypothyroidism. Chronic kidney disease. Pulmonary                  embolism.  Sonographer:     Cresenciano Lick RDCS Referring Phys:  [id number] Emeterio Reeve Diagnosing Phys: Nelva Bush MD IMPRESSIONS  1. Left ventricular ejection fraction, by estimation, is 60 to 65%. The left ventricle has normal function. Left ventricular endocardial border not optimally defined to evaluate regional wall motion. There is moderate left ventricular hypertrophy. Left ventricular diastolic function could not be evaluated.  2. Right ventricular systolic function is normal. The right ventricular size is mildly enlarged.  3. Left atrial size was moderately dilated.  4. Right atrial size was moderately dilated.  5. The mitral valve is normal in structure. Trivial mitral  valve regurgitation.  6. The aortic valve has an indeterminant number of cusps. Aortic valve regurgitation is not visualized. No aortic stenosis is present.  7. The inferior vena cava is normal in size with greater than 50% respiratory variability, suggesting right atrial pressure of 3 mmHg. FINDINGS  Left Ventricle: Left ventricular ejection fraction, by estimation, is 60 to 65%. The left ventricle has normal function. Left ventricular endocardial border not optimally defined to evaluate regional wall motion. Definity contrast agent was given IV to delineate the left ventricular endocardial borders. The left ventricular internal cavity size was normal in size. There is moderate left ventricular hypertrophy. Left ventricular diastolic function could not be evaluated due to atrial fibrillation. Left ventricular diastolic function could not be evaluated. Right Ventricle: The right ventricular size is mildly enlarged. No increase in right ventricular wall thickness. Right ventricular systolic function is normal. Left Atrium: Left atrial size was moderately dilated. Right Atrium: Right atrial size was moderately dilated. Pericardium: Trivial pericardial effusion is present. Mitral Valve: The mitral valve is normal in structure. Trivial mitral valve regurgitation. Tricuspid Valve: The tricuspid valve is normal in structure. Tricuspid valve regurgitation is trivial. Aortic Valve: The aortic valve has an indeterminant number of cusps. Aortic valve regurgitation is not visualized. No aortic stenosis is present. Pulmonic Valve: The pulmonic valve was not well visualized. Pulmonic valve regurgitation is not visualized. No evidence of pulmonic stenosis.  Aorta: The aortic root is normal in size and structure. Pulmonary Artery: The pulmonary artery is not well seen. Venous: The inferior vena cava is normal in size with greater than 50% respiratory variability, suggesting right atrial pressure of 3 mmHg. IAS/Shunts: The  interatrial septum was not well visualized.  LEFT VENTRICLE PLAX 2D LVIDd:         4.80 cm LVIDs:         3.00 cm LV PW:         1.50 cm LV IVS:        1.50 cm LVOT diam:     2.20 cm LV SV:         75 LV SV Index:   30 LVOT Area:     3.80 cm  RIGHT VENTRICLE             IVC RV Basal diam:  4.40 cm     IVC diam: 1.50 cm RV S prime:     12.80 cm/s TAPSE (M-mode): 2.4 cm LEFT ATRIUM           Index        RIGHT ATRIUM           Index LA diam:      5.20 cm 2.09 cm/m   RA Area:     22.50 cm LA Vol (A4C): 75.1 ml 30.23 ml/m  RA Volume:   71.80 ml  28.90 ml/m  AORTIC VALVE LVOT Vmax:   102.95 cm/s LVOT Vmean:  77.850 cm/s LVOT VTI:    0.196 m  AORTA Ao Root diam: 3.00 cm MV E velocity: 123.67 cm/s                             SHUNTS                             Systemic VTI:  0.20 m                             Systemic Diam: 2.20 cm Nelva Bush MD Electronically signed by Nelva Bush MD Signature Date/Time: 02/10/2021/7:15:21 AM    Final       Assessment/Plan 1.  Renal failure.  Has progressed to end-stage renal disease and now needs a PermCath for dialysis.  Will ultimately need an extremity access and we discussed that we would do this as an outpatient.  Patient voices her understanding and is agreeable to proceed 2.  Respiratory insufficiency.  Patient is requiring oxygen and has difficulty laying flat.  Likely from volume overload and need for dialysis.  PermCath placement today 3.  Pulmonary embolus.  Remains on anticoagulation 4.  Hypertension.  Stable on outpatient medications and blood pressure control important in reducing the progression of atherosclerotic disease. On appropriate oral medications.    Leotis Pain, MD  02/24/2021 3:52 PM    This note was created with Dragon medical transcription system.  Any error is purely unintentional

## 2021-02-24 NOTE — ED Notes (Signed)
Report received from [facility].

## 2021-02-24 NOTE — Progress Notes (Signed)
[facility]  ROUNDING NOTE   Subjective:   Nicole Schmidt is a 56 year old female with a past medical history of anemia, hypertension, dyslipidemia, diabetes, CHF, and chronic kidney disease.  Patient presents to the hospital with complaints of shortness of breath and sore throat which started late last week.  Patient has been admitted for AKI (acute kidney injury) ([place]) [N17.9] Acute on chronic renal failure ([place]) [N17.9, N18.9]  Patient is known to our practice and is followed by Dr. Holley Raring.  Patient was last seen in our office in November 2022.  She was recently discharged on 02/22/21. She states she increased her oxygen requirement at home but continues to have shortness of breath. Denies nausea, vomiting and diarrhea. Also states her urination has decreased.   Labs indicate increased creatinine of 6 with GFR 8. Chest xray shows pulmonary edema.    Objective:  Vital signs in last 24 hours:  Temp:  [97.8 F (36.6 C)-98.3 F (36.8 C)] 98.3 F (36.8 C) (01/12 1138) Pulse Rate:  [72-92] 92 (01/12 0834) Resp:  [18-20] 18 (01/12 0834) BP: (130-149)/(54-69) 133/69 (01/12 1138) SpO2:  [94 %-100 %] 94 % (01/12 0834) Weight:  [164.4 kg] 164.4 kg (01/11 1550)  Weight change:  There were no vitals filed for this visit.   Intake/Output: No intake/output data recorded.   Intake/Output this shift:  No intake/output data recorded.  Physical Exam: General: NAD, Sitting in chair  Head: Normocephalic, atraumatic. Moist oral mucosal membranes  Eyes: Anicteric  Lungs:  Diminished in bases, Coarse, normal effort, 2L O2  Heart: Irregular rhythm  Abdomen:  Soft, nontender  Extremities:  2+ peripheral edema.  Neurologic: Nonfocal, moving all four extremities       Basic Metabolic Panel: Recent Labs  Lab 02/18/21 0603 02/19/21 0439 02/20/21 0521 02/21/21 0417 02/22/21 0445 02/23/21 1721 02/24/21 0830  NA 140 142 141 143 139 140 145  K 5.0 5.2* 5.2* 5.0 4.9 5.0 5.1   CL 104 104 106 108 104 104 108  CO2 24 23 25 24 23 22 24   GLUCOSE 139* 145* 135* 159* 143* 128* 174*  BUN 138* 123* 120* 107* 145* 136* 154*  CREATININE 4.99* 4.99* 5.12* 5.23* 5.24* 5.82* 6.00*  CALCIUM 9.9 9.9 9.9 9.9 9.5 9.4 9.2  PHOS 8.3* 8.1* 7.9* 8.1*  --   --   --      Liver Function Tests: Recent Labs  Lab 02/18/21 0603 02/19/21 0439 02/20/21 0521 02/21/21 0417  ALBUMIN 3.2* 3.1* 2.8* 2.9*    No results for input(s): LIPASE, AMYLASE in the last 168 hours. No results for input(s): AMMONIA in the last 168 hours.  CBC: Recent Labs  Lab 02/20/21 0521 02/21/21 0417 02/23/21 1721 02/24/21 0830  WBC 14.6* 14.7* 16.6* 15.7*  HGB 9.2* 9.4* 8.8* 9.3*  HCT 30.8* 31.9* 30.0* 31.2*  MCV 90.6 92.2 93.5 93.4  PLT 251 247 262 254     Cardiac Enzymes: No results for input(s): CKTOTAL, CKMB, CKMBINDEX, TROPONINI in the last 168 hours.  BNP: Invalid input(s): POCBNP  CBG: Recent Labs  Lab 02/21/21 1819 02/22/21 0825 02/22/21 1159 02/24/21 0816 02/24/21 1121  GLUCAP 103* 131* 175* 158* 164*     Microbiology: Results for orders placed or performed during the hospital encounter of 02/23/21  Resp Panel by RT-PCR (Flu A&B, Covid) Nasopharyngeal Swab     Status: None   Collection Time: 02/23/21  9:29 PM   Specimen: Nasopharyngeal Swab; Nasopharyngeal(NP) swabs in vial transport medium  Result Value  Ref Range Status   SARS Coronavirus 2 by RT PCR NEGATIVE NEGATIVE Final    Comment: (NOTE) SARS-CoV-2 target nucleic acids are NOT DETECTED.  The SARS-CoV-2 RNA is generally detectable in upper respiratory specimens during the acute phase of infection. The lowest concentration of SARS-CoV-2 viral copies this assay can detect is 138 copies/mL. A negative result does not preclude SARS-Cov-2 infection and should not be used as the sole basis for treatment or other patient management decisions. A negative result may occur with  improper specimen collection/handling,  submission of specimen other than nasopharyngeal swab, presence of viral mutation(s) within the areas targeted by this assay, and inadequate number of viral copies(<138 copies/mL). A negative result must be combined with clinical observations, patient history, and epidemiological information. The expected result is Negative.  Fact Sheet for Patients:  EntrepreneurPulse.com.au  Fact Sheet for Healthcare Providers:  IncredibleEmployment.be  This test is no t yet approved or cleared by the [place] FDA and  has been authorized for detection and/or diagnosis of SARS-CoV-2 by FDA under an Emergency Use Authorization (EUA). This EUA will remain  in effect (meaning this test can be used) for the duration of the COVID-19 declaration under Section 564(b)(1) of the Act, 21 U.S.C.section 360bbb-3(b)(1), unless the authorization is terminated  or revoked sooner.       Influenza A by PCR NEGATIVE NEGATIVE Final   Influenza B by PCR NEGATIVE NEGATIVE Final    Comment: (NOTE) The Xpert Xpress SARS-CoV-2/FLU/RSV plus assay is intended as an aid in the diagnosis of influenza from Nasopharyngeal swab specimens and should not be used as a sole basis for treatment. Nasal washings and aspirates are unacceptable for Xpert Xpress SARS-CoV-2/FLU/RSV testing.  Fact Sheet for Patients: EntrepreneurPulse.com.au  Fact Sheet for Healthcare Providers: IncredibleEmployment.be  This test is not yet approved or cleared by the [place] FDA and has been authorized for detection and/or diagnosis of SARS-CoV-2 by FDA under an Emergency Use Authorization (EUA). This EUA will remain in effect (meaning this test can be used) for the duration of the COVID-19 declaration under Section 564(b)(1) of the Act, 21 U.S.C. section 360bbb-3(b)(1), unless the authorization is terminated or revoked.  Performed at [facility], [address] [place], [place] [address]     Coagulation Studies: No results for input(s): LABPROT, INR in the last 72 hours.   Urinalysis: Recent Labs    02/24/21 0026  COLORURINE BROWN*  LABSPEC 1.014  PHURINE TEST NOT REPORTED DUE TO COLOR INTERFERENCE OF URINE PIGMENT  GLUCOSEU TEST NOT REPORTED DUE TO COLOR INTERFERENCE OF URINE PIGMENT*  HGBUR TEST NOT REPORTED DUE TO COLOR INTERFERENCE OF URINE PIGMENT*  BILIRUBINUR TEST NOT REPORTED DUE TO COLOR INTERFERENCE OF URINE PIGMENT*  KETONESUR TEST NOT REPORTED DUE TO COLOR INTERFERENCE OF URINE PIGMENT*  PROTEINUR TEST NOT REPORTED DUE TO COLOR INTERFERENCE OF URINE PIGMENT*  NITRITE TEST NOT REPORTED DUE TO COLOR INTERFERENCE OF URINE PIGMENT*  LEUKOCYTESUR TEST NOT REPORTED DUE TO COLOR INTERFERENCE OF URINE PIGMENT*       Imaging: DG Chest 2 View  Result Date: 02/23/2021 CLINICAL DATA:  Shortness of breath EXAM: CHEST - 2 VIEW COMPARISON:  Previous studies including the examination of 02/08/2021 FINDINGS: Transverse diameter of heart is increased. There is subtle prominence of interstitial markings in the parahilar regions. There is interval decrease in patchy linear densities in the parahilar regions and lower lung fields. There is no new focal pulmonary consolidation. Lateral CP angles are essentially clear. There is  no pneumothorax. IMPRESSION: Cardiomegaly. Central pulmonary vessels are prominent. There is subtle prominence of interstitial markings in the parahilar regions, more so on the right side, possibly suggesting mild asymmetric interstitial pulmonary edema. There is interval decrease in patchy infiltrates in the parahilar regions and lower lung fields suggesting resolving atelectasis/pneumonia. Electronically Signed   By: Elmer Picker M.D.   On: 02/23/2021 18:13     Medications:      amLODipine  5 mg Oral BID   apixaban  5 mg Oral BID   atorvastatin  20 mg Oral QHS   calcitRIOL  0.25 mcg Oral Daily    calcium carbonate  500 mg of elemental calcium Oral Q breakfast   cloNIDine  0.2 mg Oral BID   Dulaglutide  1.5 mg Subcutaneous Q7 days   folic acid  1,245 mcg Oral Daily   insulin aspart  0-9 Units Subcutaneous TID AC & HS   levothyroxine  112 mcg Oral QAC breakfast   metoprolol tartrate  100 mg Oral BID   [START ON 02/25/2021] Vitamin D (Ergocalciferol)  50,000 Units Oral Q Fri   acetaminophen **OR** acetaminophen, albuterol, magnesium hydroxide, melatonin, morphine injection, ondansetron **OR** ondansetron (ZOFRAN) IV, traZODone  Assessment/ Plan:  Ms. LURINE IMEL is a 56 y.o.  female with medical problems of morbid obesity, anemia, hypertension, dyslipidemia, diabetes, CHF, and chronic kidney disease.  Patient presents to the hospital with complaints of shortness of breath and sore throat which started late last week.  Patient has been admitted for AKI (acute kidney injury) ([place]) [N17.9] Acute on chronic renal failure ([place]) [N17.9, N18.9]  Recent admission showed left kidney stone. Urology placed stent with minimal kidney function recovery. It was thoroughly discussed with patient at that time that if recovery was not achieved, we would have to initiate renal replacement therapy.  Acute Kidney Injury on chronic kidney disease stage IV .  Due to shortness of breath and lack of reported urine output, will initiate renal replacement therapy. Discussed this with patient and patient is agreeable. Consulted vascular surgery for permcath. Once placed, will begin dialysis. Dialysis coordinator aware of patient and awaiting discharge needs.     Lab Results  Component Value Date   CREATININE 6.00 (H) 02/24/2021   CREATININE 5.82 (H) 02/23/2021   CREATININE 5.24 (H) 02/22/2021   No intake or output data in the 24 hours ending 02/24/21 1232  2. Anemia of chronic kidney disease Lab Results  Component Value Date   HGB 9.3 (L) 02/24/2021    Hgb within range. EPO subq given during last  admission. Will monitor  3. Secondary Hyperparathyroidism:  Lab Results  Component Value Date   PTH 94 (H) 11/18/2017   CALCIUM 9.2 02/24/2021   PHOS 8.1 (H) 02/21/2021  Calcium within range. Phosphorus elevated.     LOS: 0 Carleigh Buccieri 1/12/202312:32 PM

## 2021-02-24 NOTE — Op Note (Signed)
OPERATIVE NOTE    PRE-OPERATIVE DIAGNOSIS: 1. ESRD   POST-OPERATIVE DIAGNOSIS: same as above  PROCEDURE: Ultrasound guidance for vascular access to the right internal jugular vein Fluoroscopic guidance for placement of catheter Placement of a 19 cm tip to cuff tunneled hemodialysis catheter via the right internal jugular vein  SURGEON: Leotis Pain, MD  ANESTHESIA:  Local with Moderate conscious sedation for approximately 19 minutes using 1 mg of Versed and 25 mcg of Fentanyl  ESTIMATED BLOOD LOSS: 10 cc  FLUORO TIME: less than one minute  CONTRAST: none  FINDING(S): 1.  Patent right internal jugular vein  SPECIMEN(S):  None  INDICATIONS:   Nicole Schmidt is a 56 y.o.female who presents with renal failure and now needs dialysis.  The patient needs long term dialysis access for their ESRD, and a Permcath is necessary.  Risks and benefits are discussed and informed consent is obtained.    DESCRIPTION: After obtaining full informed written consent, the patient was brought back to the vascular suited. The patient's right neck and chest were sterilely prepped and draped in a sterile surgical field was created. Moderate conscious sedation was administered during a face to face encounter with the patient throughout the procedure with my supervision of the RN administering medicines and monitoring the patient's vital signs, pulse oximetry, telemetry and mental status throughout from the start of the procedure until the patient was taken to the recovery room.  The right internal jugular vein was visualized with ultrasound and found to be patent. It was then accessed under direct ultrasound guidance and a permanent image was recorded. A wire was placed. After skin nick and dilatation, the peel-away sheath was placed over the wire. I then turned my attention to an area under the clavicle. Approximately 1-2 fingerbreadths below the clavicle a small counterincision was created and tunneled from the  subclavicular incision to the access site. Using fluoroscopic guidance, a 19 centimeter tip to cuff tunneled hemodialysis catheter was selected, and tunneled from the subclavicular incision to the access site. It was then placed through the peel-away sheath and the peel-away sheath was removed. Using fluoroscopic guidance the catheter tips were parked in the right atrium. The appropriate distal connectors were placed. It withdrew blood well and flushed easily with heparinized saline and a concentrated heparin solution was then placed. It was secured to the chest wall with 2 Prolene sutures. The access incision was closed single 4-0 Monocryl. A 4-0 Monocryl pursestring suture was placed around the exit site. Sterile dressings were placed. The patient tolerated the procedure well and was taken to the recovery room in stable condition.  COMPLICATIONS: None  CONDITION: Stable  Leotis Pain, MD 02/24/2021 5:03 PM   This note was created with Dragon Medical transcription system. Any errors in dictation are purely unintentional.

## 2021-02-24 NOTE — ED Notes (Signed)
Pt resting/ sleeping, NAD, calm on O2 [place] 2L.

## 2021-02-25 ENCOUNTER — Encounter: Payer: Self-pay | Admitting: Vascular Surgery

## 2021-02-25 DIAGNOSIS — N184 Chronic kidney disease, stage 4 (severe): Secondary | ICD-10-CM | POA: Diagnosis not present

## 2021-02-25 DIAGNOSIS — J9611 Chronic respiratory failure with hypoxia: Secondary | ICD-10-CM

## 2021-02-25 DIAGNOSIS — I5033 Acute on chronic diastolic (congestive) heart failure: Secondary | ICD-10-CM | POA: Diagnosis not present

## 2021-02-25 DIAGNOSIS — N170 Acute kidney failure with tubular necrosis: Secondary | ICD-10-CM | POA: Diagnosis not present

## 2021-02-25 LAB — CBC WITH DIFFERENTIAL/PLATELET
Abs Immature Granulocytes: 0.42 10*3/uL — ABNORMAL HIGH (ref 0.00–0.07)
Basophils Absolute: 0 10*3/uL (ref 0.0–0.1)
Basophils Relative: 0 %
Eosinophils Absolute: 0.3 10*3/uL (ref 0.0–0.5)
Eosinophils Relative: 2 %
HCT: 28.9 % — ABNORMAL LOW (ref 36.0–46.0)
Hemoglobin: 8.3 g/dL — ABNORMAL LOW (ref 12.0–15.0)
Immature Granulocytes: 3 %
Lymphocytes Relative: 7 %
Lymphs Abs: 1.1 10*3/uL (ref 0.7–4.0)
MCH: 27.8 pg (ref 26.0–34.0)
MCHC: 28.7 g/dL — ABNORMAL LOW (ref 30.0–36.0)
MCV: 96.7 fL (ref 80.0–100.0)
Monocytes Absolute: 1.6 10*3/uL — ABNORMAL HIGH (ref 0.1–1.0)
Monocytes Relative: 11 %
Neutro Abs: 11.8 10*3/uL — ABNORMAL HIGH (ref 1.7–7.7)
Neutrophils Relative %: 77 %
Platelets: 273 10*3/uL (ref 150–400)
RBC: 2.99 MIL/uL — ABNORMAL LOW (ref 3.87–5.11)
RDW: 15.9 % — ABNORMAL HIGH (ref 11.5–15.5)
WBC: 15.2 10*3/uL — ABNORMAL HIGH (ref 4.0–10.5)
nRBC: 0.3 % — ABNORMAL HIGH (ref 0.0–0.2)

## 2021-02-25 LAB — BASIC METABOLIC PANEL
Anion gap: 10 (ref 5–15)
BUN: 159 mg/dL — ABNORMAL HIGH (ref 6–20)
CO2: 23 mmol/L (ref 22–32)
Calcium: 8.8 mg/dL — ABNORMAL LOW (ref 8.9–10.3)
Chloride: 106 mmol/L (ref 98–111)
Creatinine, Ser: 6.77 mg/dL — ABNORMAL HIGH (ref 0.44–1.00)
GFR, Estimated: 7 mL/min — ABNORMAL LOW (ref >60)
Glucose, Bld: 139 mg/dL — ABNORMAL HIGH (ref 70–99)
Potassium: 6.1 mmol/L — ABNORMAL HIGH (ref 3.5–5.1)
Sodium: 139 mmol/L (ref 135–145)

## 2021-02-25 LAB — CBG MONITORING, ED
Glucose-Capillary: 124 mg/dL — ABNORMAL HIGH (ref 70–99)
Glucose-Capillary: 184 mg/dL — ABNORMAL HIGH (ref 70–99)
Glucose-Capillary: 188 mg/dL — ABNORMAL HIGH (ref 70–99)

## 2021-02-25 LAB — GLUCOSE, CAPILLARY: Glucose-Capillary: 122 mg/dL — ABNORMAL HIGH (ref 70–99)

## 2021-02-25 LAB — HEPATITIS B SURFACE ANTIGEN: Hepatitis B Surface Ag: NONREACTIVE

## 2021-02-25 LAB — HEPATITIS B CORE ANTIBODY, TOTAL: Hep B Core Total Ab: NONREACTIVE

## 2021-02-25 MED ORDER — HEPARIN SODIUM (PORCINE) 1000 UNIT/ML IJ SOLN
INTRAMUSCULAR | Status: AC
  Administered 2021-02-25: 4200 [IU] via INTRAVENOUS_CENTRAL
  Filled 2021-02-25: qty 10

## 2021-02-25 NOTE — Plan of Care (Signed)
Patient has urinated 4x/day (ALL bloody and only 50cc or less each).  Though the bleeding is current baseline from stent placement x2 weeks ago - patient is concerned about the little amounts that are coming out. Hosp and Nephro contated to pLease assess and discuss in future rounds.

## 2021-02-25 NOTE — Progress Notes (Signed)
PROGRESS NOTE    Nicole Schmidt  JIR:[id number] DOB: Apr 01, 1965 DOA: 02/23/2021 PCP: Lenard Simmer, MD    Brief Narrative:   Nicole Schmidt is a 56 y.o. Caucasian female with medical history significant for type 2 diabetes mellitus, hypertension, dyslipidemia, hypothyroidism, gout, HFpEF, atrial fibrillation and PE on Eliquis, who had a recent admission here from 02/01/21 till 02/22/21 during which she was managed for acute on chronic diastolic CHF and AKI superimposed on CKD stage III.  She presents to the ER with worsening lower extremity edema as well as increased dyspnea since yesterday.   Assessment & Plan:   Principal Problem:   Acute on chronic renal failure (HCC) Active Problems:   Acute renal failure superimposed on stage 4 chronic kidney disease (HCC)   Acute on chronic diastolic CHF (congestive heart failure) ([place])   Morbid obesity with BMI of 60.0-69.9, adult ([place])   Diabetes mellitus type 2 in obese ([place])   Chronic respiratory failure with hypoxia ([name])   Acute kidney injury superimposed on stage IV chronic kidney disease. Acute on chronic diastolic congestive heart failure with volume overload. Patient had a permacath placed yesterday, dialysis started today.  Discussed with nephrology, patient will be dialyzed in the hospital for 3 sessions before patient can be discharged.  Also consult transition of care to set up dialysis chair as outpatient.   Chronic hypoxemic respiratory failure Morbid obesity. Condition stable.  Paroxysmal atrial fibrillation. Continue Eliquis.  Essential hypertension. Continue home medicines       DVT prophylaxis: Eliquis Code Status: full Family Communication:  Disposition Plan:      Status is: Inpatient   Remains inpatient appropriate because: Severity of disease, pending procedures.        I/O last 3 completed shifts: In: 656.7 [IV Piggyback:656.7] Out: -  Total I/O In: -  Out: 500 [Other:500]     Consultants:   Nephrology.  Procedures: Permacath.  Antimicrobials: none  Subjective: Patient feel much better today during dialysis.  Still on 2 L oxygen which is baseline, no short of breath.  No cough. No abdominal pain or nausea vomiting. No dysuria hematuria. No fever or chills.  Objective: Vitals:   02/25/21 1015 02/25/21 1030 02/25/21 1045 02/25/21 1100  BP: (!) 117/56 109/67 111/63 (!) 111/54  Pulse:      Resp: 18 19 16 18   Temp:      TempSrc:      SpO2: 100% 97% 98% 96%  Weight:      Height:        Intake/Output Summary (Last 24 hours) at 02/25/2021 1147 Last data filed at 02/25/2021 1122 Gross per 24 hour  Intake 656.67 ml  Output 500 ml  Net 156.67 ml   Filed Weights   02/24/21 1610 02/25/21 0854  Weight: (!) 161 kg (!) 161 kg    Examination:  General exam: Appears calm and comfortable  Respiratory system: Clear to auscultation. Respiratory effort normal. Cardiovascular system: S1 & S2 heard, RRR. No JVD, murmurs, rubs, gallops or clicks.  Gastrointestinal system: Abdomen is nondistended, soft and nontender. No organomegaly or masses felt. Normal bowel sounds heard. Central nervous system: Alert and oriented. No focal neurological deficits. Extremities: 2+ leg edema Skin: No rashes, lesions or ulcers Psychiatry: Judgement and insight appear normal. Mood & affect appropriate.     Data Reviewed: I have personally reviewed following labs and imaging studies  CBC: Recent Labs  Lab 02/20/21 0521 02/21/21 0417 02/23/21 1721 02/24/21 0830 02/25/21 0737  WBC  14.6* 14.7* 16.6* 15.7* 15.2*  NEUTROABS  --   --   --   --  11.8*  HGB 9.2* 9.4* 8.8* 9.3* 8.3*  HCT 30.8* 31.9* 30.0* 31.2* 28.9*  MCV 90.6 92.2 93.5 93.4 96.7  PLT 251 247 262 254 299   Basic Metabolic Panel: Recent Labs  Lab 02/19/21 0439 02/20/21 0521 02/21/21 0417 02/22/21 0445 02/23/21 1721 02/24/21 0830 02/25/21 0737  NA 142 141 143 139 140 145 139  K 5.2* 5.2* 5.0 4.9 5.0 5.1 6.1*  CL  104 106 108 104 104 108 106  CO2 23 25 24 23 22 24 23   GLUCOSE 145* 135* 159* 143* 128* 174* 139*  BUN 123* 120* 107* 145* 136* 154* 159*  CREATININE 4.99* 5.12* 5.23* 5.24* 5.82* 6.00* 6.77*  CALCIUM 9.9 9.9 9.9 9.5 9.4 9.2 8.8*  PHOS 8.1* 7.9* 8.1*  --   --   --   --    GFR: Estimated Creatinine Clearance: 14.1 mL/min (A) (by C-G formula based on SCr of 6.77 mg/dL (H)). Liver Function Tests: Recent Labs  Lab 02/19/21 0439 02/20/21 0521 02/21/21 0417  ALBUMIN 3.1* 2.8* 2.9*   No results for input(s): LIPASE, AMYLASE in the last 168 hours. No results for input(s): AMMONIA in the last 168 hours. Coagulation Profile: No results for input(s): INR, PROTIME in the last 168 hours. Cardiac Enzymes: No results for input(s): CKTOTAL, CKMB, CKMBINDEX, TROPONINI in the last 168 hours. BNP (last 3 results) No results for input(s): PROBNP in the last 8760 hours. HbA1C: No results for input(s): HGBA1C in the last 72 hours. CBG: Recent Labs  Lab 02/24/21 0816 02/24/21 1121 02/24/21 1625 02/24/21 2124 02/25/21 0641  GLUCAP 158* 164* 85 262* 124*   Lipid Profile: No results for input(s): CHOL, HDL, LDLCALC, TRIG, CHOLHDL, LDLDIRECT in the last 72 hours. Thyroid Function Tests: No results for input(s): TSH, T4TOTAL, FREET4, T3FREE, THYROIDAB in the last 72 hours. Anemia Panel: No results for input(s): VITAMINB12, FOLATE, FERRITIN, TIBC, IRON, RETICCTPCT in the last 72 hours. Sepsis Labs: No results for input(s): PROCALCITON, LATICACIDVEN in the last 168 hours.  Recent Results (from the past 240 hour(s))  Resp Panel by RT-PCR (Flu A&B, Covid) Nasopharyngeal Swab     Status: None   Collection Time: 02/23/21  9:29 PM   Specimen: Nasopharyngeal Swab; Nasopharyngeal(NP) swabs in vial transport medium  Result Value Ref Range Status   SARS Coronavirus 2 by RT PCR NEGATIVE NEGATIVE Final    Comment: (NOTE) SARS-CoV-2 target nucleic acids are NOT DETECTED.  The SARS-CoV-2 RNA is  generally detectable in upper respiratory specimens during the acute phase of infection. The lowest concentration of SARS-CoV-2 viral copies this assay can detect is 138 copies/mL. A negative result does not preclude SARS-Cov-2 infection and should not be used as the sole basis for treatment or other patient management decisions. A negative result may occur with  improper specimen collection/handling, submission of specimen other than nasopharyngeal swab, presence of viral mutation(s) within the areas targeted by this assay, and inadequate number of viral copies(<138 copies/mL). A negative result must be combined with clinical observations, patient history, and epidemiological information. The expected result is Negative.  Fact Sheet for Patients:  EntrepreneurPulse.com.au  Fact Sheet for Healthcare Providers:  IncredibleEmployment.be  This test is no t yet approved or cleared by the [place] FDA and  has been authorized for detection and/or diagnosis of SARS-CoV-2 by FDA under an Emergency Use Authorization (EUA). This EUA will remain  in effect (  meaning this test can be used) for the duration of the COVID-19 declaration under Section 564(b)(1) of the Act, 21 U.S.C.section 360bbb-3(b)(1), unless the authorization is terminated  or revoked sooner.       Influenza A by PCR NEGATIVE NEGATIVE Final   Influenza B by PCR NEGATIVE NEGATIVE Final    Comment: (NOTE) The Xpert Xpress SARS-CoV-2/FLU/RSV plus assay is intended as an aid in the diagnosis of influenza from Nasopharyngeal swab specimens and should not be used as a sole basis for treatment. Nasal washings and aspirates are unacceptable for Xpert Xpress SARS-CoV-2/FLU/RSV testing.  Fact Sheet for Patients: EntrepreneurPulse.com.au  Fact Sheet for Healthcare Providers: IncredibleEmployment.be  This test is not yet approved or cleared by the [place] FDA and has been authorized for detection and/or diagnosis of SARS-CoV-2 by FDA under an Emergency Use Authorization (EUA). This EUA will remain in effect (meaning this test can be used) for the duration of the COVID-19 declaration under Section 564(b)(1) of the Act, 21 U.S.C. section 360bbb-3(b)(1), unless the authorization is terminated or revoked.  Performed at [facility], [address] [place], [place] [address]          Radiology Studies: DG Chest 2 View  Result Date: 02/23/2021 CLINICAL DATA:  Shortness of breath EXAM: CHEST - 2 VIEW COMPARISON:  Previous studies including the examination of 02/08/2021 FINDINGS: Transverse diameter of heart is increased. There is subtle prominence of interstitial markings in the parahilar regions. There is interval decrease in patchy linear densities in the parahilar regions and lower lung fields. There is no new focal pulmonary consolidation. Lateral CP angles are essentially clear. There is no pneumothorax. IMPRESSION: Cardiomegaly. Central pulmonary vessels are prominent. There is subtle prominence of interstitial markings in the parahilar regions, more so on the right side, possibly suggesting mild asymmetric interstitial pulmonary edema. There is interval decrease in patchy infiltrates in the parahilar regions and lower lung fields suggesting resolving atelectasis/pneumonia. Electronically Signed   By: Elmer Picker M.D.   On: 02/23/2021 18:13   PERIPHERAL VASCULAR CATHETERIZATION  Result Date: 02/24/2021 See surgical note for result.       Scheduled Meds:  amLODipine  5 mg Oral BID   apixaban  5 mg Oral BID   atorvastatin  20 mg Oral QHS   calcitRIOL  0.25 mcg Oral Daily   calcium carbonate  500 mg of elemental calcium Oral Q breakfast   Chlorhexidine Gluconate Cloth  6 each Topical Q0600   cloNIDine  0.2 mg Oral BID   Dulaglutide  1.5 mg Subcutaneous Q7 days   folic acid  4,270 mcg Oral Daily   insulin  aspart  0-9 Units Subcutaneous TID AC & HS   levothyroxine  112 mcg Oral QAC breakfast   metoprolol tartrate  100 mg Oral BID   Vitamin D (Ergocalciferol)  50,000 Units Oral Q Fri   Continuous Infusions:   LOS: 1 day    Time spent: 27 minutes    Sharen Hones, MD [facility]   To contact the attending provider between 7A-7P or the covering provider during after hours 7P-7A, please log into the web site [email or web address] and access using universal [place] password for that web site. If you do not have the password, please call the hospital operator.  02/25/2021, 11:47 AM

## 2021-02-25 NOTE — Progress Notes (Signed)
HD completed. Pt tolerated tx well. UF net removed 520ml.

## 2021-02-25 NOTE — Progress Notes (Signed)
[facility]  ROUNDING NOTE   Subjective:   Nicole Schmidt is a 56 year old female with a past medical history of anemia, hypertension, dyslipidemia, diabetes, CHF, and chronic kidney disease.  Patient presents to the hospital with complaints of shortness of breath and sore throat which started late last week.  Patient has been admitted for Cardiorenal syndrome with renal failure [I13.10] Acute on chronic renal failure (HCC) [N17.9, N18.9] AKI (acute kidney injury) ([place]) [N17.9]  Patient is known to our practice and is followed by Dr. Holley Raring.  Patient seen and evaluated during dialysis   HEMODIALYSIS FLOWSHEET:  Blood Flow Rate (mL/min): 200 mL/min Arterial Pressure (mmHg): -100 mmHg Venous Pressure (mmHg): 140 mmHg Transmembrane Pressure (mmHg): 50 mmHg Ultrafiltration Rate (mL/min): 510 mL/min Dialysate Flow Rate (mL/min): 300 ml/min Conductivity: Machine : 13.8 Conductivity: Machine : 13.8 Dialysis Fluid Bolus: Normal Saline Bolus Amount (mL): 250 mL  Patient seated in chair, tolerating treatment well Denies shortness of breath at rest    Objective:  Vital signs in last 24 hours:  Temp:  [97.6 F (36.4 C)-97.7 F (36.5 C)] 97.6 F (36.4 C) (01/13 1122) Pulse Rate:  [62-109] 84 (01/13 0612) Resp:  [13-23] 20 (01/13 1130) BP: (101-145)/(41-112) 113/63 (01/13 1130) SpO2:  [90 %-100 %] 92 % (01/13 1130) Weight:  [161 kg] 161 kg (01/13 0854)  Weight change:  Filed Weights   02/24/21 1610 02/25/21 0854  Weight: (!) 161 kg (!) 161 kg     Intake/Output: I/O last 3 completed shifts: In: 656.7 [IV Piggyback:656.7] Out: -    Intake/Output this shift:  Total I/O In: -  Out: 500 [Other:500]  Physical Exam: General: NAD, Sitting in chair  Head: Normocephalic, atraumatic. Moist oral mucosal membranes  Eyes: Anicteric  Lungs:  Diminished in bases, normal effort, 3L O2  Heart: Irregular rhythm  Abdomen:  Soft, nontender  Extremities:  2+ peripheral  edema.  Neurologic: Nonfocal, moving all four extremities       Basic Metabolic Panel: Recent Labs  Lab 02/19/21 0439 02/20/21 0521 02/21/21 0417 02/22/21 0445 02/23/21 1721 02/24/21 0830 02/25/21 0737  NA 142 141 143 139 140 145 139  K 5.2* 5.2* 5.0 4.9 5.0 5.1 6.1*  CL 104 106 108 104 104 108 106  CO2 23 25 24 23 22 24 23   GLUCOSE 145* 135* 159* 143* 128* 174* 139*  BUN 123* 120* 107* 145* 136* 154* 159*  CREATININE 4.99* 5.12* 5.23* 5.24* 5.82* 6.00* 6.77*  CALCIUM 9.9 9.9 9.9 9.5 9.4 9.2 8.8*  PHOS 8.1* 7.9* 8.1*  --   --   --   --      Liver Function Tests: Recent Labs  Lab 02/19/21 0439 02/20/21 0521 02/21/21 0417  ALBUMIN 3.1* 2.8* 2.9*    No results for input(s): LIPASE, AMYLASE in the last 168 hours. No results for input(s): AMMONIA in the last 168 hours.  CBC: Recent Labs  Lab 02/20/21 0521 02/21/21 0417 02/23/21 1721 02/24/21 0830 02/25/21 0737  WBC 14.6* 14.7* 16.6* 15.7* 15.2*  NEUTROABS  --   --   --   --  11.8*  HGB 9.2* 9.4* 8.8* 9.3* 8.3*  HCT 30.8* 31.9* 30.0* 31.2* 28.9*  MCV 90.6 92.2 93.5 93.4 96.7  PLT 251 247 262 254 273     Cardiac Enzymes: No results for input(s): CKTOTAL, CKMB, CKMBINDEX, TROPONINI in the last 168 hours.  BNP: Invalid input(s): POCBNP  CBG: Recent Labs  Lab 02/24/21 0816 02/24/21 1121 02/24/21 1625 02/24/21 2124 02/25/21  [place]     Microbiology: Results for orders placed or performed during the hospital encounter of 02/23/21  Resp Panel by RT-PCR (Flu A&B, Covid) Nasopharyngeal Swab     Status: None   Collection Time: 02/23/21  9:29 PM   Specimen: Nasopharyngeal Swab; Nasopharyngeal(NP) swabs in vial transport medium  Result Value Ref Range Status   SARS Coronavirus 2 by RT PCR NEGATIVE NEGATIVE Final    Comment: (NOTE) SARS-CoV-2 target nucleic acids are NOT DETECTED.  The SARS-CoV-2 RNA is generally detectable in upper respiratory specimens during the acute  phase of infection. The lowest concentration of SARS-CoV-2 viral copies this assay can detect is 138 copies/mL. A negative result does not preclude SARS-Cov-2 infection and should not be used as the sole basis for treatment or other patient management decisions. A negative result may occur with  improper specimen collection/handling, submission of specimen other than nasopharyngeal swab, presence of viral mutation(s) within the areas targeted by this assay, and inadequate number of viral copies(<138 copies/mL). A negative result must be combined with clinical observations, patient history, and epidemiological information. The expected result is Negative.  Fact Sheet for Patients:  EntrepreneurPulse.com.au  Fact Sheet for Healthcare Providers:  IncredibleEmployment.be  This test is no t yet approved or cleared by the [place] FDA and  has been authorized for detection and/or diagnosis of SARS-CoV-2 by FDA under an Emergency Use Authorization (EUA). This EUA will remain  in effect (meaning this test can be used) for the duration of the COVID-19 declaration under Section 564(b)(1) of the Act, 21 U.S.C.section 360bbb-3(b)(1), unless the authorization is terminated  or revoked sooner.       Influenza A by PCR NEGATIVE NEGATIVE Final   Influenza B by PCR NEGATIVE NEGATIVE Final    Comment: (NOTE) The Xpert Xpress SARS-CoV-2/FLU/RSV plus assay is intended as an aid in the diagnosis of influenza from Nasopharyngeal swab specimens and should not be used as a sole basis for treatment. Nasal washings and aspirates are unacceptable for Xpert Xpress SARS-CoV-2/FLU/RSV testing.  Fact Sheet for Patients: EntrepreneurPulse.com.au  Fact Sheet for Healthcare Providers: IncredibleEmployment.be  This test is not yet approved or cleared by the [place] FDA and has been authorized for detection and/or diagnosis of  SARS-CoV-2 by FDA under an Emergency Use Authorization (EUA). This EUA will remain in effect (meaning this test can be used) for the duration of the COVID-19 declaration under Section 564(b)(1) of the Act, 21 U.S.C. section 360bbb-3(b)(1), unless the authorization is terminated or revoked.  Performed at [facility], [address] [place], [place] [address]     Coagulation Studies: No results for input(s): LABPROT, INR in the last 72 hours.   Urinalysis: Recent Labs    02/24/21 0026  COLORURINE BROWN*  LABSPEC 1.014  PHURINE TEST NOT REPORTED DUE TO COLOR INTERFERENCE OF URINE PIGMENT  GLUCOSEU TEST NOT REPORTED DUE TO COLOR INTERFERENCE OF URINE PIGMENT*  HGBUR TEST NOT REPORTED DUE TO COLOR INTERFERENCE OF URINE PIGMENT*  BILIRUBINUR TEST NOT REPORTED DUE TO COLOR INTERFERENCE OF URINE PIGMENT*  KETONESUR TEST NOT REPORTED DUE TO COLOR INTERFERENCE OF URINE PIGMENT*  PROTEINUR TEST NOT REPORTED DUE TO COLOR INTERFERENCE OF URINE PIGMENT*  NITRITE TEST NOT REPORTED DUE TO COLOR INTERFERENCE OF URINE PIGMENT*  LEUKOCYTESUR TEST NOT REPORTED DUE TO COLOR INTERFERENCE OF URINE PIGMENT*        Imaging: DG Chest 2 View  Result Date: 02/23/2021 CLINICAL DATA:  Shortness of  breath EXAM: CHEST - 2 VIEW COMPARISON:  Previous studies including the examination of 02/08/2021 FINDINGS: Transverse diameter of heart is increased. There is subtle prominence of interstitial markings in the parahilar regions. There is interval decrease in patchy linear densities in the parahilar regions and lower lung fields. There is no new focal pulmonary consolidation. Lateral CP angles are essentially clear. There is no pneumothorax. IMPRESSION: Cardiomegaly. Central pulmonary vessels are prominent. There is subtle prominence of interstitial markings in the parahilar regions, more so on the right side, possibly suggesting mild asymmetric interstitial pulmonary edema. There is interval decrease in  patchy infiltrates in the parahilar regions and lower lung fields suggesting resolving atelectasis/pneumonia. Electronically Signed   By: Elmer Picker M.D.   On: 02/23/2021 18:13   PERIPHERAL VASCULAR CATHETERIZATION  Result Date: 02/24/2021 See surgical note for result.    Medications:      amLODipine  5 mg Oral BID   apixaban  5 mg Oral BID   atorvastatin  20 mg Oral QHS   calcitRIOL  0.25 mcg Oral Daily   calcium carbonate  500 mg of elemental calcium Oral Q breakfast   Chlorhexidine Gluconate Cloth  6 each Topical Q0600   cloNIDine  0.2 mg Oral BID   Dulaglutide  1.5 mg Subcutaneous Q7 days   folic acid  6,295 mcg Oral Daily   insulin aspart  0-9 Units Subcutaneous TID AC & HS   levothyroxine  112 mcg Oral QAC breakfast   metoprolol tartrate  100 mg Oral BID   Vitamin D (Ergocalciferol)  50,000 Units Oral Q Fri   acetaminophen **OR** acetaminophen, albuterol, magnesium hydroxide, melatonin, morphine injection, ondansetron **OR** ondansetron (ZOFRAN) IV, traZODone  Assessment/ Plan:  Nicole Schmidt is a 56 y.o.  female with medical problems of morbid obesity, anemia, hypertension, dyslipidemia, diabetes, CHF, and chronic kidney disease.  Patient presents to the hospital with complaints of shortness of breath and sore throat which started late last week.  Patient has been admitted for Cardiorenal syndrome with renal failure [I13.10] Acute on chronic renal failure (HCC) [N17.9, N18.9] AKI (acute kidney injury) ([place]) [N17.9]  Recent admission showed left kidney stone. Urology placed stent with minimal kidney function recovery. It was thoroughly discussed with patient at that time that if recovery was not achieved, we would have to initiate renal replacement therapy.  Acute Kidney Injury on chronic kidney disease stage IV .  Due to shortness of breath and lack of reported urine output, will initiate renal replacement therapy.   -Appreciate vascular surgery placing  PermCath yesterday.  -Received first dialysis treatment today, UF 0.5 L achieved.  We will plan for second dialysis treatment tomorrow for 2-1/2 hours with increased UF goal.  -Dialysis coordinator aware of patient and outpatient placement process in progress    Lab Results  Component Value Date   CREATININE 6.77 (H) 02/25/2021   CREATININE 6.00 (H) 02/24/2021   CREATININE 5.82 (H) 02/23/2021    Intake/Output Summary (Last 24 hours) at 02/25/2021 1211 Last data filed at 02/25/2021 1122 Gross per 24 hour  Intake 656.67 ml  Output 500 ml  Net 156.67 ml    2. Anemia of chronic kidney disease Lab Results  Component Value Date   HGB 8.3 (L) 02/25/2021    Hgb below target.  Will initiate low-dose EPO with treatment and increase as needed  3. Secondary Hyperparathyroidism:  Lab Results  Component Value Date   PTH 94 (H) 11/18/2017   CALCIUM 8.8 (L) 02/25/2021  PHOS 8.1 (H) 02/21/2021   Calcium within acceptable range.  We will consider binders for elevated phosphorus.    LOS: 1 Nicole Schmidt 1/13/202312:11 PM

## 2021-02-25 NOTE — Progress Notes (Addendum)
Following patient for in-center hemodialysis placement. Met with patient today, education provided. Patient chose to be placed at [facility]. Referral sent. Asked about transportation, patient stated that she can transport self or will have family/friends transport.

## 2021-02-26 DIAGNOSIS — I5033 Acute on chronic diastolic (congestive) heart failure: Secondary | ICD-10-CM | POA: Diagnosis not present

## 2021-02-26 DIAGNOSIS — N170 Acute kidney failure with tubular necrosis: Secondary | ICD-10-CM | POA: Diagnosis not present

## 2021-02-26 DIAGNOSIS — J9611 Chronic respiratory failure with hypoxia: Secondary | ICD-10-CM | POA: Diagnosis not present

## 2021-02-26 LAB — IRON AND TIBC
Iron: 36 ug/dL (ref 28–170)
Saturation Ratios: 11 % (ref 10.4–31.8)
TIBC: 319 ug/dL (ref 250–450)
UIBC: 283 ug/dL

## 2021-02-26 LAB — CBC WITH DIFFERENTIAL/PLATELET
Abs Immature Granulocytes: 0.22 10*3/uL — ABNORMAL HIGH (ref 0.00–0.07)
Basophils Absolute: 0.1 10*3/uL (ref 0.0–0.1)
Basophils Relative: 0 %
Eosinophils Absolute: 0.4 10*3/uL (ref 0.0–0.5)
Eosinophils Relative: 4 %
HCT: 27.5 % — ABNORMAL LOW (ref 36.0–46.0)
Hemoglobin: 8 g/dL — ABNORMAL LOW (ref 12.0–15.0)
Immature Granulocytes: 2 %
Lymphocytes Relative: 8 %
Lymphs Abs: 0.9 10*3/uL (ref 0.7–4.0)
MCH: 27.4 pg (ref 26.0–34.0)
MCHC: 29.1 g/dL — ABNORMAL LOW (ref 30.0–36.0)
MCV: 94.2 fL (ref 80.0–100.0)
Monocytes Absolute: 1.3 10*3/uL — ABNORMAL HIGH (ref 0.1–1.0)
Monocytes Relative: 10 %
Neutro Abs: 9.3 10*3/uL — ABNORMAL HIGH (ref 1.7–7.7)
Neutrophils Relative %: 76 %
Platelets: 244 10*3/uL (ref 150–400)
RBC: 2.92 MIL/uL — ABNORMAL LOW (ref 3.87–5.11)
RDW: 15.9 % — ABNORMAL HIGH (ref 11.5–15.5)
WBC: 12.2 10*3/uL — ABNORMAL HIGH (ref 4.0–10.5)
nRBC: 0.4 % — ABNORMAL HIGH (ref 0.0–0.2)

## 2021-02-26 LAB — HEPATITIS B SURFACE ANTIBODY, QUANTITATIVE: Hep B S AB Quant (Post): <3.1 m[IU]/mL — ABNORMAL LOW (ref >9.9)

## 2021-02-26 LAB — GLUCOSE, CAPILLARY
Glucose-Capillary: 135 mg/dL — ABNORMAL HIGH (ref 70–99)
Glucose-Capillary: 120 mg/dL — ABNORMAL HIGH (ref 70–99)
Glucose-Capillary: 192 mg/dL — ABNORMAL HIGH (ref 70–99)
Glucose-Capillary: 257 mg/dL — ABNORMAL HIGH (ref 70–99)
Glucose-Capillary: 102 mg/dL — ABNORMAL HIGH (ref 70–99)

## 2021-02-26 LAB — BASIC METABOLIC PANEL
Anion gap: 12 (ref 5–15)
BUN: 122 mg/dL — ABNORMAL HIGH (ref 6–20)
CO2: 25 mmol/L (ref 22–32)
Calcium: 8.7 mg/dL — ABNORMAL LOW (ref 8.9–10.3)
Chloride: 101 mmol/L (ref 98–111)
Creatinine, Ser: 6.03 mg/dL — ABNORMAL HIGH (ref 0.44–1.00)
GFR, Estimated: 8 mL/min — ABNORMAL LOW (ref >60)
Glucose, Bld: 134 mg/dL — ABNORMAL HIGH (ref 70–99)
Potassium: 5.1 mmol/L (ref 3.5–5.1)
Sodium: 138 mmol/L (ref 135–145)

## 2021-02-26 LAB — VITAMIN B12: Vitamin B-12: 499 pg/mL (ref 180–914)

## 2021-02-26 LAB — FERRITIN: Ferritin: 273 ng/mL (ref 11–307)

## 2021-02-26 MED ORDER — HEPARIN SODIUM (PORCINE) 1000 UNIT/ML IJ SOLN
INTRAMUSCULAR | Status: AC
  Filled 2021-02-26: qty 10

## 2021-02-26 MED ORDER — ACETAMINOPHEN 325 MG PO TABS
ORAL_TABLET | ORAL | Status: AC
  Filled 2021-02-26: qty 2

## 2021-02-26 NOTE — Progress Notes (Signed)
Pt using her own nasal pillows & headgear

## 2021-02-26 NOTE — Plan of Care (Signed)
°  Problem: Education: Goal: Knowledge of General Education information will improve Description: Including pain rating scale, medication(s)/side effects and non-pharmacologic comfort measures Outcome: Progressing   Problem: Health Behavior/Discharge Planning: Goal: Ability to manage health-related needs will improve Outcome: Progressing   Problem: Clinical Measurements: Goal: Ability to maintain clinical measurements within normal limits will improve Outcome: Progressing Goal: Will remain free from infection Outcome: Progressing Goal: Diagnostic test results will improve Outcome: Progressing Goal: Respiratory complications will improve Outcome: Progressing Goal: Cardiovascular complication will be avoided Outcome: Progressing   Problem: Activity: Goal: Risk for activity intolerance will decrease Outcome: Progressing   Problem: Nutrition: Goal: Adequate nutrition will be maintained Outcome: Progressing   Problem: Coping: Goal: Level of anxiety will decrease Outcome: Progressing   Problem: Elimination: Goal: Will not experience complications related to bowel motility Outcome: Progressing   Problem: Pain Managment: Goal: General experience of comfort will improve Outcome: Progressing   Problem: Safety: Goal: Ability to remain free from injury will improve Outcome: Progressing   Problem: Skin Integrity: Goal: Risk for impaired skin integrity will decrease Outcome: Progressing   Problem: Education: Goal: Knowledge of disease and its progression will improve Outcome: Progressing Goal: Individualized Educational Video(s) Outcome: Progressing   Problem: Health Behavior/Discharge Planning: Goal: Ability to manage health-related needs will improve Outcome: Progressing   Problem: Nutritional: Goal: Ability to make healthy dietary choices will improve Outcome: Progressing   Problem: Elimination: Goal: Will not experience complications related to urinary  retention Outcome: Not Progressing

## 2021-02-26 NOTE — Progress Notes (Signed)
Made dr. Juleen China aware patient voiding about 50 cc urine. Still slightly red/clear in color. Per md do bladder scan. Bladder scan result 74ml. Md was made aware, no new orders at this time.

## 2021-02-26 NOTE — Progress Notes (Signed)
[facility]  ROUNDING NOTE   Subjective:   Seen and examined on hemodialysis treatment. Seated in chair. Second treatment today. Tolerating treatment well.  First hemodialysis treatment was yesterday. Tolerated treatment well.   Patient states she is urinating blood.    HEMODIALYSIS FLOWSHEET:  Blood Flow Rate (mL/min): 250 mL/min Arterial Pressure (mmHg): -100 mmHg Venous Pressure (mmHg): 80 mmHg Transmembrane Pressure (mmHg): 30 mmHg Ultrafiltration Rate (mL/min): 600 mL/min Dialysate Flow Rate (mL/min): 300 ml/min Conductivity: Machine : 13.9 Conductivity: Machine : 13.9 Dialysis Fluid Bolus: Normal Saline Bolus Amount (mL): 250 mL     Objective:  Vital signs in last 24 hours:  Temp:  [97.4 F (36.3 C)-98.3 F (36.8 C)] 97.8 F (36.6 C) (01/14 0915) Pulse Rate:  [71-83] 83 (01/14 0915) Resp:  [16-20] 16 (01/14 0915) BP: (113-155)/(60-92) 155/74 (01/14 0915) SpO2:  [85 %-94 %] 90 % (01/14 0825) Weight:  [164.7 kg] 164.7 kg (01/14 0358)  Weight change: -0.027 kg Filed Weights   02/25/21 0854 02/26/21 0358  Weight: (!) 161 kg (!) 164.7 kg     Intake/Output: I/O last 3 completed shifts: In: 656.7 [IV Piggyback:656.7] Out: 500 [Other:500]   Intake/Output this shift:  No intake/output data recorded.  Physical Exam: General: NAD, Sitting in chair  Head: Normocephalic, atraumatic. Moist oral mucosal membranes  Eyes: Anicteric  Lungs:  Diminished bilaterally, crackles bilaterally, 6L [place] O2  Heart: Irregular   Abdomen:  Soft, nontender, obese  Extremities:  2+ peripheral edema.  Neurologic: Nonfocal, moving all four extremities  Access:  [facility] permcath 6/57/84    Basic Metabolic Panel: Recent Labs  Lab 02/20/21 0521 02/21/21 0417 02/22/21 0445 02/23/21 1721 02/24/21 0830 02/25/21 0737 02/26/21 0519  NA 141 143 139 140 145 139 138  K 5.2* 5.0 4.9 5.0 5.1 6.1* 5.1  CL 106 108 104 104 108 106 101  CO2 25 24 23 22 24 23 25   GLUCOSE 135*  159* 143* 128* 174* 139* 134*  BUN 120* 107* 145* 136* 154* 159* 122*  CREATININE 5.12* 5.23* 5.24* 5.82* 6.00* 6.77* 6.03*  CALCIUM 9.9 9.9 9.5 9.4 9.2 8.8* 8.7*  PHOS 7.9* 8.1*  --   --   --   --   --      Liver Function Tests: Recent Labs  Lab 02/20/21 0521 02/21/21 0417  ALBUMIN 2.8* 2.9*    No results for input(s): LIPASE, AMYLASE in the last 168 hours. No results for input(s): AMMONIA in the last 168 hours.  CBC: Recent Labs  Lab 02/21/21 0417 02/23/21 1721 02/24/21 0830 02/25/21 0737 02/26/21 0519  WBC 14.7* 16.6* 15.7* 15.2* 12.2*  NEUTROABS  --   --   --  11.8* 9.3*  HGB 9.4* 8.8* 9.3* 8.3* 8.0*  HCT 31.9* 30.0* 31.2* 28.9* 27.5*  MCV 92.2 93.5 93.4 96.7 94.2  PLT 247 262 254 273 244     Cardiac Enzymes: No results for input(s): CKTOTAL, CKMB, CKMBINDEX, TROPONINI in the last 168 hours.  BNP: Invalid input(s): POCBNP  CBG: Recent Labs  Lab 02/25/21 1242 02/25/21 1622 02/25/21 2222 02/26/21 0417 02/26/21 0823  GLUCAP 184* 188* 122* 135* 120*     Microbiology: Results for orders placed or performed during the hospital encounter of 02/23/21  Resp Panel by RT-PCR (Flu A&B, Covid) Nasopharyngeal Swab     Status: None   Collection Time: 02/23/21  9:29 PM   Specimen: Nasopharyngeal Swab; Nasopharyngeal(NP) swabs in vial transport medium  Result Value Ref Range Status   SARS Coronavirus  2 by RT PCR NEGATIVE NEGATIVE Final    Comment: (NOTE) SARS-CoV-2 target nucleic acids are NOT DETECTED.  The SARS-CoV-2 RNA is generally detectable in upper respiratory specimens during the acute phase of infection. The lowest concentration of SARS-CoV-2 viral copies this assay can detect is 138 copies/mL. A negative result does not preclude SARS-Cov-2 infection and should not be used as the sole basis for treatment or other patient management decisions. A negative result may occur with  improper specimen collection/handling, submission of specimen other than  nasopharyngeal swab, presence of viral mutation(s) within the areas targeted by this assay, and inadequate number of viral copies(<138 copies/mL). A negative result must be combined with clinical observations, patient history, and epidemiological information. The expected result is Negative.  Fact Sheet for Patients:  EntrepreneurPulse.com.au  Fact Sheet for Healthcare Providers:  IncredibleEmployment.be  This test is no t yet approved or cleared by the [place] FDA and  has been authorized for detection and/or diagnosis of SARS-CoV-2 by FDA under an Emergency Use Authorization (EUA). This EUA will remain  in effect (meaning this test can be used) for the duration of the COVID-19 declaration under Section 564(b)(1) of the Act, 21 U.S.C.section 360bbb-3(b)(1), unless the authorization is terminated  or revoked sooner.       Influenza A by PCR NEGATIVE NEGATIVE Final   Influenza B by PCR NEGATIVE NEGATIVE Final    Comment: (NOTE) The Xpert Xpress SARS-CoV-2/FLU/RSV plus assay is intended as an aid in the diagnosis of influenza from Nasopharyngeal swab specimens and should not be used as a sole basis for treatment. Nasal washings and aspirates are unacceptable for Xpert Xpress SARS-CoV-2/FLU/RSV testing.  Fact Sheet for Patients: EntrepreneurPulse.com.au  Fact Sheet for Healthcare Providers: IncredibleEmployment.be  This test is not yet approved or cleared by the [place] FDA and has been authorized for detection and/or diagnosis of SARS-CoV-2 by FDA under an Emergency Use Authorization (EUA). This EUA will remain in effect (meaning this test can be used) for the duration of the COVID-19 declaration under Section 564(b)(1) of the Act, 21 U.S.C. section 360bbb-3(b)(1), unless the authorization is terminated or revoked.  Performed at [facility], [address] [place], [place]  [address]     Coagulation Studies: No results for input(s): LABPROT, INR in the last 72 hours.   Urinalysis: Recent Labs    02/24/21 0026  COLORURINE BROWN*  LABSPEC 1.014  PHURINE TEST NOT REPORTED DUE TO COLOR INTERFERENCE OF URINE PIGMENT  GLUCOSEU TEST NOT REPORTED DUE TO COLOR INTERFERENCE OF URINE PIGMENT*  HGBUR TEST NOT REPORTED DUE TO COLOR INTERFERENCE OF URINE PIGMENT*  BILIRUBINUR TEST NOT REPORTED DUE TO COLOR INTERFERENCE OF URINE PIGMENT*  KETONESUR TEST NOT REPORTED DUE TO COLOR INTERFERENCE OF URINE PIGMENT*  PROTEINUR TEST NOT REPORTED DUE TO COLOR INTERFERENCE OF URINE PIGMENT*  NITRITE TEST NOT REPORTED DUE TO COLOR INTERFERENCE OF URINE PIGMENT*  LEUKOCYTESUR TEST NOT REPORTED DUE TO COLOR INTERFERENCE OF URINE PIGMENT*        Imaging: PERIPHERAL VASCULAR CATHETERIZATION  Result Date: 02/24/2021 See surgical note for result.    Medications:      amLODipine  5 mg Oral BID   apixaban  5 mg Oral BID   atorvastatin  20 mg Oral QHS   calcitRIOL  0.25 mcg Oral Daily   calcium carbonate  500 mg of elemental calcium Oral Q breakfast   Chlorhexidine Gluconate Cloth  6 each Topical Q0600   cloNIDine  0.2 mg Oral BID  Dulaglutide  1.5 mg Subcutaneous Q7 days   folic acid  3,383 mcg Oral Daily   insulin aspart  0-9 Units Subcutaneous TID AC & HS   levothyroxine  112 mcg Oral QAC breakfast   metoprolol tartrate  100 mg Oral BID   Vitamin D (Ergocalciferol)  50,000 Units Oral Q Fri   acetaminophen **OR** acetaminophen, albuterol, magnesium hydroxide, melatonin, morphine injection, ondansetron **OR** ondansetron (ZOFRAN) IV, traZODone  Assessment/ Plan:  Nicole Schmidt is a 56 y.o. white female hypertension, hyperlipidemia, diabetes mellitus type II, diastolic congestive heart failure, hypothyroidism, history of breast cancer, atrial fibrillation and pulmonary embolism who was admitted to [facility] on 02/23/2021 for Cardiorenal syndrome with renal failure  [I13.10] Acute on chronic renal failure ([place]) [N17.9, N18.9] AKI (acute kidney injury) ([place]) [N17.9]  Recent admission showed left kidney stone. Urology placed stent with minimal kidney function recovery. It was thoroughly discussed with patient at that time that if recovery was not achieved, we would have to initiate renal replacement therapy.  Acute Kidney Injury on chronic kidney disease stage IV: baseline creatinine of 2.43 on 01/10/21. 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 Requiring hemodialysis. First treatment on 1/13. Seen and examined on second treatment. Tolerating treatment well. Plan on third dialysis treatment for Monday 1/16.  - outpatient planning for [address]   Anemia of chronic kidney disease: hemoglobin 8. No ESA administered on this admission.   Hypertension with chronic kidney disease: elevated on dialysis treatment. On amlodipine, clonidine and metoprolol.   Hematuria: with recent episode of nephrolithiasis. If continues, will do bladder irrigation.   Secondary Hyperparathyroidism: PTH on 12/30/20 was 72. Currently without binders. - Continue calcitriol and ergocalciferol.   Acute respiratory failure: increased oxygen requirements from her baseline of 2 liters. Secondary to pneumonia and pulmonary edema.  - ultrafiltration with dialysis.  - start weaning oxygen.    LOS: 2 Kaye Luoma 1/14/202311:17 AM

## 2021-02-26 NOTE — Progress Notes (Signed)
Patient off the floor for dialysis.

## 2021-02-26 NOTE — Progress Notes (Signed)
PROGRESS NOTE    Nicole Schmidt  XTG:[id number] DOB: August 28, 1965 DOA: 02/23/2021 PCP: Lenard Simmer, MD    Brief Narrative:  Nicole Schmidt is a 56 y.o. Caucasian female with medical history significant for type 2 diabetes mellitus, hypertension, dyslipidemia, hypothyroidism, gout, HFpEF, atrial fibrillation and PE on Eliquis, who had a recent admission here from 02/01/21 till 02/22/21 during which she was managed for acute on chronic diastolic CHF and AKI superimposed on CKD stage III.  She presents to the ER with worsening lower extremity edema as well as increased dyspnea.  Patient has a permacath placed and started dialysis on 1/13.   Assessment & Plan:   Principal Problem:   Acute on chronic renal failure (HCC) Active Problems:   Acute renal failure superimposed on stage 4 chronic kidney disease (HCC)   Acute on chronic diastolic CHF (congestive heart failure) ([place])   Morbid obesity with BMI of 60.0-69.9, adult ([place])   Diabetes mellitus type 2 in obese ([place])   Chronic respiratory failure with hypoxia ([place])   Acute kidney injury superimposed on stage IV chronic kidney disease. Acute on chronic diastolic congestive heart failure with volume overload. Anemia of chronic kidney disease. Patient is started on hemodialysis on the second day.  Volume status doing better.  Discussed with nephrology, will continue to dialyze today.  TOC consult obtained for outpatient dialysis chair. Check iron B12 level.   Chronic hypoxemic respiratory failure Morbid obesity. Obstructive sleep apnea Patient normally wears CPAP at home, she had an increased oxygen requirement last night.  We will make sure she has her CPAP set up tonight.  Paroxysmal atrial fibrillation. Continue Eliquis.  Essential hypertension. Continue home medicines       DVT prophylaxis: Eliquis Code Status: full Family Communication:  Disposition Plan:      Status is: Inpatient   Remains inpatient appropriate  because: Severity of disease, daily HD      I/O last 3 completed shifts: In: 656.7 [IV Piggyback:656.7] Out: 500 [Other:500] No intake/output data recorded.     Consultants:  Nephrology  Procedures: Permacath, HD.  Antimicrobials: None  Subjective: Patient had a worsening hypoxemia last night, apparently she was wearing CPAP at home.  Ordered CPAP tonight. She feels better today, no short of breath.  No cough.  No chest pain. No abdominal pain or nausea vomiting.  Objective: Vitals:   02/26/21 0415 02/26/21 0825 02/26/21 0825 02/26/21 0915  BP: (!) 147/76 (!) 142/71 (!) 142/71 (!) 155/74  Pulse: 73  79 83  Resp: 20 20 20 16   Temp: (!) 97.4 F (36.3 C) 98.3 F (36.8 C) 98.3 F (36.8 C) 97.8 F (36.6 C)  TempSrc:    Oral  SpO2: (!) 89%  90%   Weight:      Height:        Intake/Output Summary (Last 24 hours) at 02/26/2021 1010 Last data filed at 02/25/2021 1122 Gross per 24 hour  Intake --  Output 500 ml  Net -500 ml   Filed Weights   02/25/21 0854 02/26/21 0358  Weight: (!) 161 kg (!) 164.7 kg    Examination:  General exam: Appears calm and comfortable  Respiratory system: Clear to auscultation. Respiratory effort normal. Cardiovascular system: S1 & S2 heard, RRR. No JVD, murmurs, rubs, gallops or clicks.  Gastrointestinal system: Abdomen is nondistended, soft and nontender. No organomegaly or masses felt. Normal bowel sounds heard. Central nervous system: Alert and oriented. No focal neurological deficits. Extremities: 2-3+ leg edema. Skin: No  rashes, lesions or ulcers Psychiatry: Judgement and insight appear normal. Mood & affect appropriate.     Data Reviewed: I have personally reviewed following labs and imaging studies  CBC: Recent Labs  Lab 02/21/21 0417 02/23/21 1721 02/24/21 0830 02/25/21 0737 02/26/21 0519  WBC 14.7* 16.6* 15.7* 15.2* 12.2*  NEUTROABS  --   --   --  11.8* 9.3*  HGB 9.4* 8.8* 9.3* 8.3* 8.0*  HCT 31.9* 30.0* 31.2*  28.9* 27.5*  MCV 92.2 93.5 93.4 96.7 94.2  PLT 247 262 254 273 850   Basic Metabolic Panel: Recent Labs  Lab 02/20/21 0521 02/21/21 0417 02/22/21 0445 02/23/21 1721 02/24/21 0830 02/25/21 0737 02/26/21 0519  NA 141 143 139 140 145 139 138  K 5.2* 5.0 4.9 5.0 5.1 6.1* 5.1  CL 106 108 104 104 108 106 101  CO2 25 24 23 22 24 23 25   GLUCOSE 135* 159* 143* 128* 174* 139* 134*  BUN 120* 107* 145* 136* 154* 159* 122*  CREATININE 5.12* 5.23* 5.24* 5.82* 6.00* 6.77* 6.03*  CALCIUM 9.9 9.9 9.5 9.4 9.2 8.8* 8.7*  PHOS 7.9* 8.1*  --   --   --   --   --    GFR: Estimated Creatinine Clearance: 16 mL/min (A) (by C-G formula based on SCr of 6.03 mg/dL (H)). Liver Function Tests: Recent Labs  Lab 02/20/21 0521 02/21/21 0417  ALBUMIN 2.8* 2.9*   No results for input(s): LIPASE, AMYLASE in the last 168 hours. No results for input(s): AMMONIA in the last 168 hours. Coagulation Profile: No results for input(s): INR, PROTIME in the last 168 hours. Cardiac Enzymes: No results for input(s): CKTOTAL, CKMB, CKMBINDEX, TROPONINI in the last 168 hours. BNP (last 3 results) No results for input(s): PROBNP in the last 8760 hours. HbA1C: No results for input(s): HGBA1C in the last 72 hours. CBG: Recent Labs  Lab 02/25/21 1242 02/25/21 1622 02/25/21 2222 02/26/21 0417 02/26/21 0823  GLUCAP 184* 188* 122* 135* 120*   Lipid Profile: No results for input(s): CHOL, HDL, LDLCALC, TRIG, CHOLHDL, LDLDIRECT in the last 72 hours. Thyroid Function Tests: No results for input(s): TSH, T4TOTAL, FREET4, T3FREE, THYROIDAB in the last 72 hours. Anemia Panel: No results for input(s): VITAMINB12, FOLATE, FERRITIN, TIBC, IRON, RETICCTPCT in the last 72 hours. Sepsis Labs: No results for input(s): PROCALCITON, LATICACIDVEN in the last 168 hours.  Recent Results (from the past 240 hour(s))  Resp Panel by RT-PCR (Flu A&B, Covid) Nasopharyngeal Swab     Status: None   Collection Time: 02/23/21  9:29 PM    Specimen: Nasopharyngeal Swab; Nasopharyngeal(NP) swabs in vial transport medium  Result Value Ref Range Status   SARS Coronavirus 2 by RT PCR NEGATIVE NEGATIVE Final    Comment: (NOTE) SARS-CoV-2 target nucleic acids are NOT DETECTED.  The SARS-CoV-2 RNA is generally detectable in upper respiratory specimens during the acute phase of infection. The lowest concentration of SARS-CoV-2 viral copies this assay can detect is 138 copies/mL. A negative result does not preclude SARS-Cov-2 infection and should not be used as the sole basis for treatment or other patient management decisions. A negative result may occur with  improper specimen collection/handling, submission of specimen other than nasopharyngeal swab, presence of viral mutation(s) within the areas targeted by this assay, and inadequate number of viral copies(<138 copies/mL). A negative result must be combined with clinical observations, patient history, and epidemiological information. The expected result is Negative.  Fact Sheet for Patients:  EntrepreneurPulse.com.au  Fact Sheet for Healthcare  Providers:  IncredibleEmployment.be  This test is no t yet approved or cleared by the [place] and  has been authorized for detection and/or diagnosis of SARS-CoV-2 by FDA under an Emergency Use Authorization (EUA). This EUA will remain  in effect (meaning this test can be used) for the duration of the COVID-19 declaration under Section 564(b)(1) of the Act, 21 U.S.C.section 360bbb-3(b)(1), unless the authorization is terminated  or revoked sooner.       Influenza A by PCR NEGATIVE NEGATIVE Final   Influenza B by PCR NEGATIVE NEGATIVE Final    Comment: (NOTE) The Xpert Xpress SARS-CoV-2/FLU/RSV plus assay is intended as an aid in the diagnosis of influenza from Nasopharyngeal swab specimens and should not be used as a sole basis for treatment. Nasal washings and aspirates are  unacceptable for Xpert Xpress SARS-CoV-2/FLU/RSV testing.  Fact Sheet for Patients: EntrepreneurPulse.com.au  Fact Sheet for Healthcare Providers: IncredibleEmployment.be  This test is not yet approved or cleared by the [place] FDA and has been authorized for detection and/or diagnosis of SARS-CoV-2 by FDA under an Emergency Use Authorization (EUA). This EUA will remain in effect (meaning this test can be used) for the duration of the COVID-19 declaration under Section 564(b)(1) of the Act, 21 U.S.C. section 360bbb-3(b)(1), unless the authorization is terminated or revoked.  Performed at [facility], [address] [place], [place] [address]          Radiology Studies: PERIPHERAL VASCULAR CATHETERIZATION  Result Date: 02/24/2021 See surgical note for result.       Scheduled Meds:  amLODipine  5 mg Oral BID   apixaban  5 mg Oral BID   atorvastatin  20 mg Oral QHS   calcitRIOL  0.25 mcg Oral Daily   calcium carbonate  500 mg of elemental calcium Oral Q breakfast   Chlorhexidine Gluconate Cloth  6 each Topical Q0600   cloNIDine  0.2 mg Oral BID   Dulaglutide  1.5 mg Subcutaneous Q7 days   folic acid  8,250 mcg Oral Daily   insulin aspart  0-9 Units Subcutaneous TID AC & HS   levothyroxine  112 mcg Oral QAC breakfast   metoprolol tartrate  100 mg Oral BID   Vitamin D (Ergocalciferol)  50,000 Units Oral Q Fri   Continuous Infusions:   LOS: 2 days    Time spent: 27 minutes    Sharen Hones, MD [facility]   To contact the attending provider between 7A-7P or the covering provider during after hours 7P-7A, please log into the web site [email or web address] and access using universal [place] password for that web site. If you do not have the password, please call the hospital operator.  02/26/2021, 10:10 AM

## 2021-02-27 DIAGNOSIS — J9621 Acute and chronic respiratory failure with hypoxia: Secondary | ICD-10-CM

## 2021-02-27 DIAGNOSIS — I5033 Acute on chronic diastolic (congestive) heart failure: Secondary | ICD-10-CM | POA: Diagnosis not present

## 2021-02-27 DIAGNOSIS — N184 Chronic kidney disease, stage 4 (severe): Secondary | ICD-10-CM | POA: Diagnosis not present

## 2021-02-27 DIAGNOSIS — N170 Acute kidney failure with tubular necrosis: Secondary | ICD-10-CM | POA: Diagnosis not present

## 2021-02-27 LAB — GLUCOSE, CAPILLARY
Glucose-Capillary: 104 mg/dL — ABNORMAL HIGH (ref 70–99)
Glucose-Capillary: 192 mg/dL — ABNORMAL HIGH (ref 70–99)
Glucose-Capillary: 128 mg/dL — ABNORMAL HIGH (ref 70–99)
Glucose-Capillary: 180 mg/dL — ABNORMAL HIGH (ref 70–99)

## 2021-02-27 NOTE — Progress Notes (Signed)
[facility]  ROUNDING NOTE   Subjective:   Hemodialysis treatment yesterday. UF of 94mL. Tolerated treatment well. Treatment was done in a chair.   Patient today states she feels weak and thinks she may need rehab.   Patient urinated approximately 88mL of urine yesterday. It was bloody.  Bladder scan was negative for post void residual.   6 L oxygen. Did not tolerate CPAP last night    Objective:  Vital signs in last 24 hours:  Temp:  [97.5 F (36.4 C)-98.3 F (36.8 C)] 97.5 F (36.4 C) (01/15 1017) Pulse Rate:  [45-100] 91 (01/15 1017) Resp:  [13-36] 14 (01/15 1017) BP: (123-227)/(48-204) 142/69 (01/15 1017) SpO2:  [94 %-100 %] 98 % (01/15 1017)  Weight change:  Filed Weights   02/25/21 0854 02/26/21 0358  Weight: (!) 161 kg (!) 164.7 kg     Intake/Output: I/O last 3 completed shifts: In: -  Out: 1024 [Urine:50; Other:974]   Intake/Output this shift:  Total I/O In: 240 [P.O.:240] Out: -   Physical Exam: General: NAD, Sitting in chair  Head: Normocephalic, atraumatic. Moist oral mucosal membranes  Eyes: Anicteric  Lungs:  Diminished bilaterally, crackles bilaterally, 6L  O2  Heart: Irregular   Abdomen:  Soft, nontender, obese  Extremities:  2+ peripheral edema.  Neurologic: Nonfocal, moving all four extremities  Access:  RIJ permcath 7/90/24    Basic Metabolic Panel: Recent Labs  Lab 02/21/21 0417 02/22/21 0445 02/23/21 1721 02/24/21 0830 02/25/21 0737 02/26/21 0519  NA 143 139 140 145 139 138  K 5.0 4.9 5.0 5.1 6.1* 5.1  CL 108 104 104 108 106 101  CO2 24 23 22 24 23 25   GLUCOSE 159* 143* 128* 174* 139* 134*  BUN 107* 145* 136* 154* 159* 122*  CREATININE 5.23* 5.24* 5.82* 6.00* 6.77* 6.03*  CALCIUM 9.9 9.5 9.4 9.2 8.8* 8.7*  PHOS 8.1*  --   --   --   --   --      Liver Function Tests: Recent Labs  Lab 02/21/21 0417  ALBUMIN 2.9*    No results for input(s): LIPASE, AMYLASE in the last 168 hours. No results for  input(s): AMMONIA in the last 168 hours.  CBC: Recent Labs  Lab 02/21/21 0417 02/23/21 1721 02/24/21 0830 02/25/21 0737 02/26/21 0519  WBC 14.7* 16.6* 15.7* 15.2* 12.2*  NEUTROABS  --   --   --  11.8* 9.3*  HGB 9.4* 8.8* 9.3* 8.3* 8.0*  HCT 31.9* 30.0* 31.2* 28.9* 27.5*  MCV 92.2 93.5 93.4 96.7 94.2  PLT 247 262 254 273 244     Cardiac Enzymes: No results for input(s): CKTOTAL, CKMB, CKMBINDEX, TROPONINI in the last 168 hours.  BNP: Invalid input(s): POCBNP  CBG: Recent Labs  Lab 02/26/21 0823 02/26/21 1323 02/26/21 1603 02/26/21 2207 02/27/21 0853  GLUCAP 120* 192* 257* 102* 104*     Microbiology: Results for orders placed or performed during the hospital encounter of 02/23/21  Resp Panel by RT-PCR (Flu A&B, Covid) Nasopharyngeal Swab     Status: None   Collection Time: 02/23/21  9:29 PM   Specimen: Nasopharyngeal Swab; Nasopharyngeal(NP) swabs in vial transport medium  Result Value Ref Range Status   SARS Coronavirus 2 by RT PCR NEGATIVE NEGATIVE Final    Comment: (NOTE) SARS-CoV-2 target nucleic acids are NOT DETECTED.  The SARS-CoV-2 RNA is generally detectable in upper respiratory specimens during the acute phase of infection. The lowest concentration of SARS-CoV-2 viral copies this assay can detect  is 138 copies/mL. A negative result does not preclude SARS-Cov-2 infection and should not be used as the sole basis for treatment or other patient management decisions. A negative result may occur with  improper specimen collection/handling, submission of specimen other than nasopharyngeal swab, presence of viral mutation(s) within the areas targeted by this assay, and inadequate number of viral copies(<138 copies/mL). A negative result must be combined with clinical observations, patient history, and epidemiological information. The expected result is Negative.  Fact Sheet for Patients:  EntrepreneurPulse.com.au  Fact Sheet for  Healthcare Providers:  IncredibleEmployment.be  This test is no t yet approved or cleared by the [place] FDA and  has been authorized for detection and/or diagnosis of SARS-CoV-2 by FDA under an Emergency Use Authorization (EUA). This EUA will remain  in effect (meaning this test can be used) for the duration of the COVID-19 declaration under Section 564(b)(1) of the Act, 21 U.S.C.section 360bbb-3(b)(1), unless the authorization is terminated  or revoked sooner.       Influenza A by PCR NEGATIVE NEGATIVE Final   Influenza B by PCR NEGATIVE NEGATIVE Final    Comment: (NOTE) The Xpert Xpress SARS-CoV-2/FLU/RSV plus assay is intended as an aid in the diagnosis of influenza from Nasopharyngeal swab specimens and should not be used as a sole basis for treatment. Nasal washings and aspirates are unacceptable for Xpert Xpress SARS-CoV-2/FLU/RSV testing.  Fact Sheet for Patients: EntrepreneurPulse.com.au  Fact Sheet for Healthcare Providers: IncredibleEmployment.be  This test is not yet approved or cleared by the [place] FDA and has been authorized for detection and/or diagnosis of SARS-CoV-2 by FDA under an Emergency Use Authorization (EUA). This EUA will remain in effect (meaning this test can be used) for the duration of the COVID-19 declaration under Section 564(b)(1) of the Act, 21 U.S.C. section 360bbb-3(b)(1), unless the authorization is terminated or revoked.  Performed at [facility], [address] [place], [place] [address]     Coagulation Studies: No results for input(s): LABPROT, INR in the last 72 hours.   Urinalysis: No results for input(s): COLORURINE, LABSPEC, PHURINE, GLUCOSEU, HGBUR, BILIRUBINUR, KETONESUR, PROTEINUR, UROBILINOGEN, NITRITE, LEUKOCYTESUR in the last 72 hours.  Invalid input(s): APPERANCEUR      Imaging: No results found.   Medications:      amLODipine  5  mg Oral BID   apixaban  5 mg Oral BID   atorvastatin  20 mg Oral QHS   calcitRIOL  0.25 mcg Oral Daily   calcium carbonate  500 mg of elemental calcium Oral Q breakfast   Chlorhexidine Gluconate Cloth  6 each Topical Q0600   cloNIDine  0.2 mg Oral BID   folic acid  3,825 mcg Oral Daily   insulin aspart  0-9 Units Subcutaneous TID AC & HS   levothyroxine  112 mcg Oral QAC breakfast   metoprolol tartrate  100 mg Oral BID   Vitamin D (Ergocalciferol)  50,000 Units Oral Q Fri   acetaminophen **OR** acetaminophen, albuterol, magnesium hydroxide, melatonin, morphine injection, ondansetron **OR** ondansetron (ZOFRAN) IV, traZODone  Assessment/ Plan:  [name] is a 56 y.o. white female hypertension, hyperlipidemia, diabetes mellitus type II, diastolic congestive heart failure, hypothyroidism, history of breast cancer, atrial fibrillation and pulmonary embolism who was admitted to [facility] on 02/23/2021 for Cardiorenal syndrome with renal failure [I13.10] Acute on chronic renal failure ([place]) [N17.9, N18.9] AKI (acute kidney injury) ([place]) [N17.9]  Acute Kidney Injury on chronic kidney disease stage IV: baseline creatinine of 2.43 on 01/10/21.  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 Requiring hemodialysis. First treatment on 1/13, second treatment 1/14. Plan on third dialysis treatment for Monday 1/16.  - outpatient planning for [address]   Anemia of chronic kidney disease: hemoglobin 8. No ESA administered on this admission. Iron levels consistent with iron deficiency.   Hypertension with chronic kidney disease: 142/69. On amlodipine, clonidine and metoprolol.   Hematuria: with recent episode of nephrolithiasis.    Secondary Hyperparathyroidism: PTH on 12/30/20 was 72. Currently without binders. - Continue calcitriol and ergocalciferol.   Acute respiratory failure: increased oxygen requirements from her baseline of 2 liters. Secondary  to pneumonia and pulmonary edema.    LOS: 3 Creg Gilmer 1/15/202310:36 AM

## 2021-02-27 NOTE — Progress Notes (Signed)
°  Transition of Care [facility] Screening Note   Patient Details  Name: KARA MELCHING Date of Birth: 03-04-1965   [facility]) CM/SW Contact:    Alberteen Sam, LCSW Phone Number: 02/27/2021, 2:42 PM  CSW notes Renal Navigator working on outpatient HD chair for patient.  Transition of Care Department [facility]) has reviewed patient and no TOC needs have been identified at this time. We will continue to monitor patient advancement through interdisciplinary progression rounds. If new patient transition needs arise, please place a TOC consult.  [place], [place]

## 2021-02-27 NOTE — Progress Notes (Signed)
PROGRESS NOTE    Nicole Schmidt  WER:[id number] DOB: 1965/06/10 DOA: 02/23/2021 PCP: Lenard Simmer, MD   Chief complaint.  Shortness of breath. Brief Narrative:  Nicole Schmidt is a 56 y.o. Caucasian female with medical history significant for type 2 diabetes mellitus, hypertension, dyslipidemia, hypothyroidism, gout, HFpEF, atrial fibrillation and PE on Eliquis, who had a recent admission here from 02/01/21 till 02/22/21 during which she was managed for acute on chronic diastolic CHF and AKI superimposed on CKD stage III.  She presents to the ER with worsening lower extremity edema as well as increased dyspnea.  Patient has a permacath placed and started dialysis on 1/13.   Assessment & Plan:   Principal Problem:   Acute on chronic renal failure (HCC) Active Problems:   Acute renal failure superimposed on stage 4 chronic kidney disease (HCC)   Acute on chronic diastolic CHF (congestive heart failure) (HCC)   Obstructive sleep apnea   Morbid obesity with BMI of 60.0-69.9, adult (HCC)   Diabetes mellitus type 2 in obese (HCC)   AF (paroxysmal atrial fibrillation) (HCC)   Chronic respiratory failure with hypoxia ([place])  Acute kidney injury superimposed on stage IV chronic kidney disease. Acute on chronic diastolic congestive heart failure with volume overload. Anemia of chronic kidney disease. Discussed with nephrology, patient volume status much better.  She is dialyzed x2, HD on hold today, she will be dialyzed again on Monday.   Acute on chronic hypoxemic respiratory failure. Ruled in Morbid obesity. Obstructive sleep apnea Patient has increased oxygen requirement since yesterday, currently on 5 L oxygen.  No evidence of pneumonia, not suspecting PE as patient already taking Eliquis.  Volume status is better after dialysis. This is probably due to obstructive sleep apnea.  Patient was not able to tolerate the CPAP last night, patient also states that she was using CPAP at home  intermittently.  Advised her to use CPAP tonight, also give incentive spirometer.  Wean oxygen.  Generalized weakness. Patient feels very weak, will obtain PT/OT.  Paroxysmal atrial fibrillation. Continue anticoagulation.  Essential hypertension. Continue current regimen       DVT prophylaxis: Eliquis Code Status: full Family Communication:  Disposition Plan:      Status is: Inpatient   Remains inpatient appropriate because: Severity of disease, unsafe discharge.      I/O last 3 completed shifts: In: -  Out: 1024 [Urine:50; Other:974] Total I/O In: 240 [P.O.:240] Out: -      Consultants:  Nephrology.  Procedures: HD  Antimicrobials: None  Subjective: Patient did not tolerate the CPAP, which is her own machine. She is currently on 5 L oxygen, no significant short of breath.  No cough. Denies any abdominal pain nausea vomiting No dysuria or hematuria. No fever chills   Objective: Vitals:   02/26/21 1609 02/26/21 2205 02/27/21 0600 02/27/21 1017  BP: 126/79 (!) 123/48 (!) 148/58 (!) 142/69  Pulse: 68 100 60 91  Resp: 20 18 18 14   Temp: 98.1 F (36.7 C) 97.9 F (36.6 C) (!) 97.5 F (36.4 C) (!) 97.5 F (36.4 C)  TempSrc:  [place]  SpO2: 97% 95% 95% 98%  Weight:      Height:        Intake/Output Summary (Last 24 hours) at 02/27/2021 1039 Last data filed at 02/27/2021 0950 Gross per 24 hour  Intake 240 ml  Output 1024 ml  Net -784 ml   Filed Weights   02/25/21 0854 02/26/21 0358  Weight: Marland Kitchen)  161 kg (!) 164.7 kg    Examination:  General exam: Appears calm and comfortable, morbid obese. Respiratory system: Clear to auscultation. Respiratory effort normal. Cardiovascular system: S1 & S2 heard, RRR. No JVD, murmurs, rubs, gallops or clicks.  Gastrointestinal system: Abdomen is nondistended, soft and nontender. No organomegaly or masses felt. Normal bowel sounds heard. Central nervous system: Alert and oriented. No focal neurological  deficits. Extremities: 2-3+ leg edema Skin: No rashes, lesions or ulcers Psychiatry: Judgement and insight appear normal. Mood & affect appropriate.     Data Reviewed: I have personally reviewed following labs and imaging studies  CBC: Recent Labs  Lab 02/21/21 0417 02/23/21 1721 02/24/21 0830 02/25/21 0737 02/26/21 0519  WBC 14.7* 16.6* 15.7* 15.2* 12.2*  NEUTROABS  --   --   --  11.8* 9.3*  HGB 9.4* 8.8* 9.3* 8.3* 8.0*  HCT 31.9* 30.0* 31.2* 28.9* 27.5*  MCV 92.2 93.5 93.4 96.7 94.2  PLT 247 262 254 273 737   Basic Metabolic Panel: Recent Labs  Lab 02/21/21 0417 02/22/21 0445 02/23/21 1721 02/24/21 0830 02/25/21 0737 02/26/21 0519  NA 143 139 140 145 139 138  K 5.0 4.9 5.0 5.1 6.1* 5.1  CL 108 104 104 108 106 101  CO2 24 23 22 24 23 25   GLUCOSE 159* 143* 128* 174* 139* 134*  BUN 107* 145* 136* 154* 159* 122*  CREATININE 5.23* 5.24* 5.82* 6.00* 6.77* 6.03*  CALCIUM 9.9 9.5 9.4 9.2 8.8* 8.7*  PHOS 8.1*  --   --   --   --   --    GFR: Estimated Creatinine Clearance: 16 mL/min (A) (by C-G formula based on SCr of 6.03 mg/dL (H)). Liver Function Tests: Recent Labs  Lab 02/21/21 0417  ALBUMIN 2.9*   No results for input(s): LIPASE, AMYLASE in the last 168 hours. No results for input(s): AMMONIA in the last 168 hours. Coagulation Profile: No results for input(s): INR, PROTIME in the last 168 hours. Cardiac Enzymes: No results for input(s): CKTOTAL, CKMB, CKMBINDEX, TROPONINI in the last 168 hours. BNP (last 3 results) No results for input(s): PROBNP in the last 8760 hours. HbA1C: No results for input(s): HGBA1C in the last 72 hours. CBG: Recent Labs  Lab 02/26/21 0823 02/26/21 1323 02/26/21 1603 02/26/21 2207 02/27/21 0853  GLUCAP 120* 192* 257* 102* 104*   Lipid Profile: No results for input(s): CHOL, HDL, LDLCALC, TRIG, CHOLHDL, LDLDIRECT in the last 72 hours. Thyroid Function Tests: No results for input(s): TSH, T4TOTAL, FREET4, T3FREE, THYROIDAB  in the last 72 hours. Anemia Panel: Recent Labs    02/26/21 1207  VITAMINB12 499  FERRITIN 273  TIBC 319  IRON 36   Sepsis Labs: No results for input(s): PROCALCITON, LATICACIDVEN in the last 168 hours.  Recent Results (from the past 240 hour(s))  Resp Panel by RT-PCR (Flu A&B, Covid) Nasopharyngeal Swab     Status: None   Collection Time: 02/23/21  9:29 PM   Specimen: Nasopharyngeal Swab; Nasopharyngeal(NP) swabs in vial transport medium  Result Value Ref Range Status   SARS Coronavirus 2 by RT PCR NEGATIVE NEGATIVE Final    Comment: (NOTE) SARS-CoV-2 target nucleic acids are NOT DETECTED.  The SARS-CoV-2 RNA is generally detectable in upper respiratory specimens during the acute phase of infection. The lowest concentration of SARS-CoV-2 viral copies this assay can detect is 138 copies/mL. A negative result does not preclude SARS-Cov-2 infection and should not be used as the sole basis for treatment or other patient management decisions.  A negative result may occur with  improper specimen collection/handling, submission of specimen other than nasopharyngeal swab, presence of viral mutation(s) within the areas targeted by this assay, and inadequate number of viral copies(<138 copies/mL). A negative result must be combined with clinical observations, patient history, and epidemiological information. The expected result is Negative.  Fact Sheet for Patients:  EntrepreneurPulse.com.au  Fact Sheet for Healthcare Providers:  IncredibleEmployment.be  This test is no t yet approved or cleared by the [place] FDA and  has been authorized for detection and/or diagnosis of SARS-CoV-2 by FDA under an Emergency Use Authorization (EUA). This EUA will remain  in effect (meaning this test can be used) for the duration of the COVID-19 declaration under Section 564(b)(1) of the Act, 21 U.S.C.section 360bbb-3(b)(1), unless the authorization is  terminated  or revoked sooner.       Influenza A by PCR NEGATIVE NEGATIVE Final   Influenza B by PCR NEGATIVE NEGATIVE Final    Comment: (NOTE) The Xpert Xpress SARS-CoV-2/FLU/RSV plus assay is intended as an aid in the diagnosis of influenza from Nasopharyngeal swab specimens and should not be used as a sole basis for treatment. Nasal washings and aspirates are unacceptable for Xpert Xpress SARS-CoV-2/FLU/RSV testing.  Fact Sheet for Patients: EntrepreneurPulse.com.au  Fact Sheet for Healthcare Providers: IncredibleEmployment.be  This test is not yet approved or cleared by the [place] FDA and has been authorized for detection and/or diagnosis of SARS-CoV-2 by FDA under an Emergency Use Authorization (EUA). This EUA will remain in effect (meaning this test can be used) for the duration of the COVID-19 declaration under Section 564(b)(1) of the Act, 21 U.S.C. section 360bbb-3(b)(1), unless the authorization is terminated or revoked.  Performed at [facility], [address], [place], [place] [address]          Radiology Studies: No results found.      Scheduled Meds:  amLODipine  5 mg Oral BID   apixaban  5 mg Oral BID   atorvastatin  20 mg Oral QHS   calcitRIOL  0.25 mcg Oral Daily   calcium carbonate  500 mg of elemental calcium Oral Q breakfast   Chlorhexidine Gluconate Cloth  6 each Topical Q0600   cloNIDine  0.2 mg Oral BID   folic acid  5,726 mcg Oral Daily   insulin aspart  0-9 Units Subcutaneous TID AC & HS   levothyroxine  112 mcg Oral QAC breakfast   metoprolol tartrate  100 mg Oral BID   Vitamin D (Ergocalciferol)  50,000 Units Oral Q Fri   Continuous Infusions:   LOS: 3 days    Time spent: 32 minutes    Sharen Hones, MD [facility]   To contact the attending provider between 7A-7P or the covering provider during after hours 7P-7A, please log into the web site [email or web address] and  access using universal [place] password for that web site. If you do not have the password, please call the hospital operator.  02/27/2021, 10:39 AM

## 2021-02-27 NOTE — Evaluation (Signed)
Physical Therapy Evaluation Patient Details Name: Nicole Schmidt MRN: [id number] DOB: 03/24/1965 Today's Date: 02/27/2021  History of Present Illness  Pt is a 56 y/o F admitted on 02/23/21 after presenting to the ER with worsening LE edema & increased dyspnea. Pt has a permacath placed & started dialysis on 02/25/21. Pt is being treated for AKI superimposed on stage IV CKD, acute on chronic diastolic CHF with volume overload. Pt with recent admission 02/01/21-02/22/21 for management of acute on chronic diastolic CHF & AKI superimposed on CKD stage 3. PMH: DM2, HTN, dyslipidemia, hypothyroidism, gout, HFpEF, a-fib & PE on eliquis, breast CA, obesity  Clinical Impression  Pt seen for PT evaluation with pt agreeable. Pt on 4L/min upon PT arrival & completion of toileting with NT with SPO2 85%. O2 increased to 5L/min for remainder of session. Pt is able to ambulate extremely short distances in room without AD & CGA with pt requiring seated rest break between trials 2/2 SOB. Pt does drop to 87% after gait with extra time & cuing for pursed lip breathing to recover; SPO2 90% upon PT departure. Pt is unsafe to d/c home alone & would benefit from STR upon d/c to maximize independence with functional mobility & reduce fall risk prior to return home. Will continue to follow pt acutely to progress endurance & balance as able.        Recommendations for follow up therapy are one component of a multi-disciplinary discharge planning process, led by the attending physician.  Recommendations may be updated based on patient status, additional functional criteria and insurance authorization.  Follow Up Recommendations Skilled nursing-short term rehab (<3 hours/day)    Assistance Recommended at Discharge Intermittent Supervision/Assistance  Patient can return home with the following  Help with stairs or ramp for entrance;Assist for transportation;Assistance with cooking/housework;A little help with walking and/or  transfers;A little help with bathing/dressing/bathroom    Equipment Recommendations None recommended by PT (TBD in next venue)  Recommendations for Other Services       Functional Status Assessment Patient has had a recent decline in their functional status and demonstrates the ability to make significant improvements in function in a reasonable and predictable amount of time.     Precautions / Restrictions Precautions Precautions: Fall Restrictions Weight Bearing Restrictions: No      Mobility  Bed Mobility               General bed mobility comments: not observed, pt received & left sitting in recliner    Transfers Overall transfer level: Needs assistance Equipment used: None Transfers: Sit to/from Stand Sit to Stand: Supervision                Ambulation/Gait Ambulation/Gait assistance: Min guard Gait Distance (Feet):  (5 + 10 ft) Assistive device: None Gait Pattern/deviations: Step-through pattern;Wide base of support;Decreased step length - left;Decreased step length - right;Decreased stride length Gait velocity: decreased     General Gait Details: Pt ambulates forward & back equating ~ 5 ft then 10 ft without AD.  Stairs            Wheelchair Mobility    Modified Rankin (Stroke Patients Only)       Balance Overall balance assessment: Mild deficits observed, not formally tested           Standing balance-Leahy Scale: Fair Standing balance comment: increased lateral weight shift during ambulation due to body habitus  Pertinent Vitals/Pain Pain Assessment: No/denies pain    Home Living Family/patient expects to be discharged to:: Private residence Living Arrangements: Alone Available Help at Discharge: Family;Friend(s);Available PRN/intermittently Type of Home: House Home Access: Ramped entrance       Home Layout: One level Home Equipment: Cane - single point;Rollator (4 wheels);Rolling  Walker (2 wheels);BSC/3in1;Wheelchair - manual;Shower seat Additional Comments: Pt currently lives in her mother's old home that is 1 level with level entry, but planning to move to a new house as soon as possible that's 1 level with 4 steps to enter with B rails.    Prior Function Prior Level of Function : Independent/Modified Independent             Mobility Comments: Pt reports independence without AD, no falls, driving.       Hand Dominance        Extremity/Trunk Assessment   Upper Extremity Assessment Upper Extremity Assessment: Overall WFL for tasks assessed    Lower Extremity Assessment Lower Extremity Assessment: Generalized weakness       Communication   Communication: No difficulties  Cognition Arousal/Alertness: Awake/alert Behavior During Therapy: WFL for tasks assessed/performed Overall Cognitive Status: Within Functional Limits for tasks assessed                                 General Comments: Motivated to participate & return to PLOF.        General Comments General comments (skin integrity, edema, etc.): Reviewed use & frequency of use of incentive spirometer with pt voicing understanding.    Exercises     Assessment/Plan    PT Assessment Patient needs continued PT services  PT Problem List Decreased activity tolerance;Cardiopulmonary status limiting activity;Decreased balance;Decreased strength;Decreased mobility       PT Treatment Interventions DME instruction;Therapeutic exercise;Gait training;Balance training;Stair training;Neuromuscular re-education;Functional mobility training;Patient/family education;Therapeutic activities    PT Goals (Current goals can be found in the Care Plan section)  Acute Rehab PT Goals Patient Stated Goal: get better, return to PLOF PT Goal Formulation: With patient Time For Goal Achievement: 03/13/21 Potential to Achieve Goals: Good    Frequency Min 2X/week     Co-evaluation                AM-PAC PT "6 Clicks" Mobility  Outcome Measure Help needed turning from your back to your side while in a flat bed without using bedrails?: None Help needed moving from lying on your back to sitting on the side of a flat bed without using bedrails?: A Little Help needed moving to and from a bed to a chair (including a wheelchair)?: A Little Help needed standing up from a chair using your arms (e.g., wheelchair or bedside chair)?: A Little Help needed to walk in hospital room?: A Little Help needed climbing 3-5 steps with a railing? : A Lot 6 Click Score: 18    End of Session Equipment Utilized During Treatment: Oxygen Activity Tolerance: Patient tolerated treatment well (limited by O2) Patient left: in chair;with call bell/phone within reach Nurse Communication: Mobility status (O2) PT Visit Diagnosis: Unsteadiness on feet (R26.81);Muscle weakness (generalized) (M62.81);Difficulty in walking, not elsewhere classified (R26.2)    Time: 0623-7628 PT Time Calculation (min) (ACUTE ONLY): 21 min   Charges:   PT Evaluation $PT Eval Low Complexity: [place], PT, DPT 02/27/21, 3:12 PM   [name] 02/27/2021,  3:11 PM

## 2021-02-28 DIAGNOSIS — D631 Anemia in chronic kidney disease: Secondary | ICD-10-CM | POA: Diagnosis not present

## 2021-02-28 DIAGNOSIS — N184 Chronic kidney disease, stage 4 (severe): Secondary | ICD-10-CM | POA: Diagnosis not present

## 2021-02-28 DIAGNOSIS — N179 Acute kidney failure, unspecified: Secondary | ICD-10-CM | POA: Diagnosis not present

## 2021-02-28 DIAGNOSIS — N170 Acute kidney failure with tubular necrosis: Secondary | ICD-10-CM | POA: Diagnosis not present

## 2021-02-28 DIAGNOSIS — I129 Hypertensive chronic kidney disease with stage 1 through stage 4 chronic kidney disease, or unspecified chronic kidney disease: Secondary | ICD-10-CM | POA: Diagnosis not present

## 2021-02-28 DIAGNOSIS — I5033 Acute on chronic diastolic (congestive) heart failure: Secondary | ICD-10-CM | POA: Diagnosis not present

## 2021-02-28 DIAGNOSIS — J9621 Acute and chronic respiratory failure with hypoxia: Secondary | ICD-10-CM | POA: Diagnosis not present

## 2021-02-28 LAB — BASIC METABOLIC PANEL
Anion gap: 8 (ref 5–15)
BUN: 103 mg/dL — ABNORMAL HIGH (ref 6–20)
CO2: 27 mmol/L (ref 22–32)
Calcium: 8.4 mg/dL — ABNORMAL LOW (ref 8.9–10.3)
Chloride: 101 mmol/L (ref 98–111)
Creatinine, Ser: 6.13 mg/dL — ABNORMAL HIGH (ref 0.44–1.00)
GFR, Estimated: 7 mL/min — ABNORMAL LOW (ref >60)
Glucose, Bld: 177 mg/dL — ABNORMAL HIGH (ref 70–99)
Potassium: 4.2 mmol/L (ref 3.5–5.1)
Sodium: 136 mmol/L (ref 135–145)

## 2021-02-28 LAB — CBC
HCT: 26.2 % — ABNORMAL LOW (ref 36.0–46.0)
Hemoglobin: 7.9 g/dL — ABNORMAL LOW (ref 12.0–15.0)
MCH: 27.3 pg (ref 26.0–34.0)
MCHC: 30.2 g/dL (ref 30.0–36.0)
MCV: 90.7 fL (ref 80.0–100.0)
Platelets: 265 10*3/uL (ref 150–400)
RBC: 2.89 MIL/uL — ABNORMAL LOW (ref 3.87–5.11)
RDW: 15.7 % — ABNORMAL HIGH (ref 11.5–15.5)
WBC: 12 10*3/uL — ABNORMAL HIGH (ref 4.0–10.5)
nRBC: 0.2 % (ref 0.0–0.2)

## 2021-02-28 LAB — GLUCOSE, CAPILLARY
Glucose-Capillary: 150 mg/dL — ABNORMAL HIGH (ref 70–99)
Glucose-Capillary: 243 mg/dL — ABNORMAL HIGH (ref 70–99)
Glucose-Capillary: 173 mg/dL — ABNORMAL HIGH (ref 70–99)

## 2021-02-28 MED ORDER — EPOETIN ALFA 10000 UNIT/ML IJ SOLN
10000.0000 [IU] | INTRAMUSCULAR | Status: DC
  Administered 2021-02-28 – 2021-03-02 (×2): 10000 [IU] via INTRAVENOUS

## 2021-02-28 MED ORDER — HEPARIN SODIUM (PORCINE) 1000 UNIT/ML IJ SOLN
INTRAMUSCULAR | Status: AC
  Filled 2021-02-28: qty 10

## 2021-02-28 MED ORDER — EPOETIN ALFA 10000 UNIT/ML IJ SOLN
INTRAMUSCULAR | Status: AC
  Filled 2021-02-28: qty 1

## 2021-02-28 NOTE — Progress Notes (Signed)
°   02/26/21 1115 02/26/21 1130 02/26/21 1145  Vitals  BP (!) 153/129 (!) 150/106 (!) 142/100  MAP (mmHg) 137 118 115  Pulse Rate 85 89 85  ECG Heart Rate 83 79 89  Resp 19 13 (!) 36  During Hemodialysis Assessment  Blood Flow Rate (mL/min) 250 mL/min 250 mL/min 250 mL/min  Arterial Pressure (mmHg) -100 mmHg -100 mmHg -100 mmHg  Venous Pressure (mmHg) 90 mmHg 90 mmHg 90 mmHg  Transmembrane Pressure (mmHg) 30 mmHg 30 mmHg 30 mmHg  Ultrafiltration Rate (mL/min) 600 mL/min 600 mL/min 600 mL/min  Dialysate Flow Rate (mL/min) 300 ml/min 300 ml/min 300 ml/min  Conductivity: Machine  13.9 13.9 13.9  HD Safety Checks Performed Yes Yes Yes  Intra-Hemodialysis Comments Progressing as prescribed;Tolerated well Progressing as prescribed;Tolerated well Progressing as prescribed;Tolerated well    02/26/21 1200 02/26/21 1209  Vitals  BP (!) 191/174 (!) 217/198  MAP (mmHg) 181 205  Pulse Rate 76 (!) 45  ECG Heart Rate 90 87  Resp 20 (!) 23  During Hemodialysis Assessment  Blood Flow Rate (mL/min) 250 mL/min  --   Arterial Pressure (mmHg) -100 mmHg  --   Venous Pressure (mmHg) 90 mmHg  --   Transmembrane Pressure (mmHg) 30 mmHg  --   Ultrafiltration Rate (mL/min) 600 mL/min  --   Dialysate Flow Rate (mL/min) 300 ml/min  --   Conductivity: Machine  13.9  --   HD Safety Checks Performed Yes  --   Intra-Hemodialysis Comments Progressing as prescribed;Tolerated well Tx completed

## 2021-02-28 NOTE — Progress Notes (Signed)
°   02/26/21 0938 02/26/21 0945 02/26/21 1000  Vitals  BP  --   --  123/88  MAP (mmHg)  --   --  98  Pulse Rate  --   --  76  ECG Heart Rate  --   --  70  Resp  --   --  (!) 27  During Hemodialysis Assessment  Blood Flow Rate (mL/min) 250 mL/min 250 mL/min 250 mL/min  Arterial Pressure (mmHg) -80 mmHg -90 mmHg -90 mmHg  Venous Pressure (mmHg) 80 mmHg 80 mmHg 80 mmHg  Transmembrane Pressure (mmHg) 30 mmHg 30 mmHg 30 mmHg  Ultrafiltration Rate (mL/min) 600 mL/min 600 mL/min 600 mL/min  Dialysate Flow Rate (mL/min) 300 ml/min 300 ml/min 300 ml/min  Conductivity: Machine  13.9 13.9 13.9  HD Safety Checks Performed Yes Yes Yes  Intra-Hemodialysis Comments Tx initiated Progressing as prescribed;Tolerated well Progressing as prescribed;Tolerated well    02/26/21 1015 02/26/21 1030 02/26/21 1045  Vitals  BP 113/68 (!) 152/73 (!) 163/54  MAP (mmHg) 83 93 76  Pulse Rate 87 86 88  ECG Heart Rate 82 84 84  Resp 10 19 18   During Hemodialysis Assessment  Blood Flow Rate (mL/min) 250 mL/min 250 mL/min 250 mL/min  Arterial Pressure (mmHg) -90 mmHg -90 mmHg -100 mmHg  Venous Pressure (mmHg) 80 mmHg 80 mmHg 80 mmHg  Transmembrane Pressure (mmHg) 30 mmHg 30 mmHg 30 mmHg  Ultrafiltration Rate (mL/min) 600 mL/min 600 mL/min 600 mL/min  Dialysate Flow Rate (mL/min) 300 ml/min 300 ml/min 300 ml/min  Conductivity: Machine  13.9 13.9 13.9  HD Safety Checks Performed Yes Yes Yes  Intra-Hemodialysis Comments Progressing as prescribed;Tolerated well Progressing as prescribed;Tolerated well Progressing as prescribed;Tolerated well    02/26/21 1100  Vitals  BP (!) 148/126  MAP (mmHg) 134  Pulse Rate 80  ECG Heart Rate 85  Resp 17  During Hemodialysis Assessment  Blood Flow Rate (mL/min) 250 mL/min  Arterial Pressure (mmHg) -100 mmHg  Venous Pressure (mmHg) 90 mmHg  Transmembrane Pressure (mmHg) 30 mmHg  Ultrafiltration Rate (mL/min) 600 mL/min  Dialysate Flow Rate (mL/min) 300 ml/min   Conductivity: Machine  13.9  HD Safety Checks Performed Yes  Intra-Hemodialysis Comments Progressing as prescribed;Tolerated well

## 2021-02-28 NOTE — Progress Notes (Addendum)
PROGRESS NOTE    Nicole Schmidt  GLO:[id number] DOB: May 13, 1965 DOA: 02/23/2021 PCP: Lenard Simmer, MD    Brief Narrative:  Nicole Schmidt is a 56 y.o. Caucasian female with medical history significant for type 2 diabetes mellitus, hypertension, dyslipidemia, hypothyroidism, gout, HFpEF, atrial fibrillation and PE on Eliquis, who had a recent admission here from 02/01/21 till 02/22/21 during which she was managed for acute on chronic diastolic CHF and AKI superimposed on CKD stage III.  She presents to the ER with worsening lower extremity edema as well as increased dyspnea.  Patient has a permacath placed and started dialysis on 1/13.   Assessment & Plan:   Principal Problem:   Acute on chronic renal failure (HCC) Active Problems:   Acute renal failure superimposed on stage 4 chronic kidney disease (HCC)   Acute on chronic diastolic CHF (congestive heart failure) (HCC)   Obstructive sleep apnea   Morbid obesity with BMI of 60.0-69.9, adult (HCC)   Diabetes mellitus type 2 in obese ([place])   Acute on chronic respiratory failure with hypoxemia (HCC)   AF (paroxysmal atrial fibrillation) (HCC)   Chronic respiratory failure with hypoxia (HCC)  Acute kidney injury superimposed on stage IV chronic kidney disease. Acute on chronic diastolic congestive heart failure with volume overload. Anemia of chronic kidney disease. Patient is receiving hemodialysis today, volume status much better. Patient currently has acute kidney injury on CKD stage 4. Nephrology will follow patient in the next 6 months to determine if patient renal function can recover. If not, it becomes end stage,  Currently looking for placement.   Acute on chronic hypoxemic respiratory failure. Ruled in Morbid obesity. Obstructive sleep apnea Patient tolerated PAP machine better last night. She does not have any short of breath, continue wean oxygen.   Generalized weakness. Looking to acute rehab  placement.  Paroxysmal atrial fibrillation. Continue anticoagulation.  Essential hypertension. Continue current regimen       DVT prophylaxis: Eliquis Code Status: full Family Communication:  Disposition Plan:      Status is: Inpatient   Remains inpatient appropriate because: Severity of disease, unsafe discharge.     I/O last 3 completed shifts: In: 1200 [P.O.:1200] Out: 201 [Urine:201] No intake/output data recorded.    Subjective: Patient doing better today, she slept better last night.  She was able to wear CPAP most of the time.  She is still on oxygen, will try to wean it off.  She does not have a cough.  She still have short of breath with exertion. No abdominal pain or nausea vomiting. She still has some bloody urine, which has been happening for last 2 weeks since she had a urinary stent placement.  Objective: Vitals:   02/28/21 1300 02/28/21 1315 02/28/21 1330 02/28/21 1345  BP: 115/60 (!) 120/47 123/71 124/61  Pulse: 84 80 69 84  Resp:      Temp:      TempSrc:      SpO2: 97% 95% 97% 94%  Weight:      Height:        Intake/Output Summary (Last 24 hours) at 02/28/2021 1358 Last data filed at 02/28/2021 0700 Gross per 24 hour  Intake 480 ml  Output 201 ml  Net 279 ml   Filed Weights   02/26/21 0358  Weight: (!) 164.7 kg    Examination:  General exam: Appears calm and comfortable, morbid obesity. Respiratory system: Clear to auscultation. Respiratory effort normal. Cardiovascular system: S1 & S2 heard, RRR. No  JVD, murmurs, rubs, gallops or clicks.  Gastrointestinal system: Abdomen is nondistended, soft and nontender. No organomegaly or masses felt. Normal bowel sounds heard. Central nervous system: Alert and oriented. No focal neurological deficits. Extremities: 2-3+leg edema Skin: No rashes, lesions or ulcers Psychiatry: Judgement and insight appear normal. Mood & affect appropriate.     Data Reviewed: I have personally reviewed following  labs and imaging studies  CBC: Recent Labs  Lab 02/23/21 1721 02/24/21 0830 02/25/21 0737 02/26/21 0519 02/28/21 0549  WBC 16.6* 15.7* 15.2* 12.2* 12.0*  NEUTROABS  --   --  11.8* 9.3*  --   HGB 8.8* 9.3* 8.3* 8.0* 7.9*  HCT 30.0* 31.2* 28.9* 27.5* 26.2*  MCV 93.5 93.4 96.7 94.2 90.7  PLT 262 254 273 244 350   Basic Metabolic Panel: Recent Labs  Lab 02/23/21 1721 02/24/21 0830 02/25/21 0737 02/26/21 0519 02/28/21 0549  NA 140 145 139 138 136  K 5.0 5.1 6.1* 5.1 4.2  CL 104 108 106 101 101  CO2 22 24 23 25 27   GLUCOSE 128* 174* 139* 134* 177*  BUN 136* 154* 159* 122* 103*  CREATININE 5.82* 6.00* 6.77* 6.03* 6.13*  CALCIUM 9.4 9.2 8.8* 8.7* 8.4*   GFR: Estimated Creatinine Clearance: 15.8 mL/min (A) (by C-G formula based on SCr of 6.13 mg/dL (H)). Liver Function Tests: No results for input(s): AST, ALT, ALKPHOS, BILITOT, PROT, ALBUMIN in the last 168 hours. No results for input(s): LIPASE, AMYLASE in the last 168 hours. No results for input(s): AMMONIA in the last 168 hours. Coagulation Profile: No results for input(s): INR, PROTIME in the last 168 hours. Cardiac Enzymes: No results for input(s): CKTOTAL, CKMB, CKMBINDEX, TROPONINI in the last 168 hours. BNP (last 3 results) No results for input(s): PROBNP in the last 8760 hours. HbA1C: No results for input(s): HGBA1C in the last 72 hours. CBG: Recent Labs  Lab 02/27/21 0853 02/27/21 1109 02/27/21 1723 02/27/21 2154 02/28/21 0808  GLUCAP 104* 192* 128* 180* 150*   Lipid Profile: No results for input(s): CHOL, HDL, LDLCALC, TRIG, CHOLHDL, LDLDIRECT in the last 72 hours. Thyroid Function Tests: No results for input(s): TSH, T4TOTAL, FREET4, T3FREE, THYROIDAB in the last 72 hours. Anemia Panel: Recent Labs    02/26/21 1207  VITAMINB12 499  FERRITIN 273  TIBC 319  IRON 36   Sepsis Labs: No results for input(s): PROCALCITON, LATICACIDVEN in the last 168 hours.  Recent Results (from the past 240  hour(s))  Resp Panel by RT-PCR (Flu A&B, Covid) Nasopharyngeal Swab     Status: None   Collection Time: 02/23/21  9:29 PM   Specimen: Nasopharyngeal Swab; Nasopharyngeal(NP) swabs in vial transport medium  Result Value Ref Range Status   SARS Coronavirus 2 by RT PCR NEGATIVE NEGATIVE Final    Comment: (NOTE) SARS-CoV-2 target nucleic acids are NOT DETECTED.  The SARS-CoV-2 RNA is generally detectable in upper respiratory specimens during the acute phase of infection. The lowest concentration of SARS-CoV-2 viral copies this assay can detect is 138 copies/mL. A negative result does not preclude SARS-Cov-2 infection and should not be used as the sole basis for treatment or other patient management decisions. A negative result may occur with  improper specimen collection/handling, submission of specimen other than nasopharyngeal swab, presence of viral mutation(s) within the areas targeted by this assay, and inadequate number of viral copies(<138 copies/mL). A negative result must be combined with clinical observations, patient history, and epidemiological information. The expected result is Negative.  Fact Sheet for Patients:  EntrepreneurPulse.com.au  Fact Sheet for Healthcare Providers:  IncredibleEmployment.be  This test is no t yet approved or cleared by the [place] FDA and  has been authorized for detection and/or diagnosis of SARS-CoV-2 by FDA under an Emergency Use Authorization (EUA). This EUA will remain  in effect (meaning this test can be used) for the duration of the COVID-19 declaration under Section 564(b)(1) of the Act, 21 U.S.C.section 360bbb-3(b)(1), unless the authorization is terminated  or revoked sooner.       Influenza A by PCR NEGATIVE NEGATIVE Final   Influenza B by PCR NEGATIVE NEGATIVE Final    Comment: (NOTE) The Xpert Xpress SARS-CoV-2/FLU/RSV plus assay is intended as an aid in the diagnosis of influenza from  Nasopharyngeal swab specimens and should not be used as a sole basis for treatment. Nasal washings and aspirates are unacceptable for Xpert Xpress SARS-CoV-2/FLU/RSV testing.  Fact Sheet for Patients: EntrepreneurPulse.com.au  Fact Sheet for Healthcare Providers: IncredibleEmployment.be  This test is not yet approved or cleared by the [place] FDA and has been authorized for detection and/or diagnosis of SARS-CoV-2 by FDA under an Emergency Use Authorization (EUA). This EUA will remain in effect (meaning this test can be used) for the duration of the COVID-19 declaration under Section 564(b)(1) of the Act, 21 U.S.C. section 360bbb-3(b)(1), unless the authorization is terminated or revoked.  Performed at [facility], [address], [place], [place] [address]          Radiology Studies: No results found.      Scheduled Meds:  amLODipine  5 mg Oral BID   apixaban  5 mg Oral BID   atorvastatin  20 mg Oral QHS   calcitRIOL  0.25 mcg Oral Daily   calcium carbonate  500 mg of elemental calcium Oral Q breakfast   Chlorhexidine Gluconate Cloth  6 each Topical Q0600   cloNIDine  0.2 mg Oral BID   epoetin alfa       epoetin (EPOGEN/PROCRIT) injection  10,000 Units Intravenous Q M,W,F-HD   folic acid  5,409 mcg Oral Daily   heparin sodium (porcine)       insulin aspart  0-9 Units Subcutaneous TID AC & HS   levothyroxine  112 mcg Oral QAC breakfast   metoprolol tartrate  100 mg Oral BID   Vitamin D (Ergocalciferol)  50,000 Units Oral Q Fri   Continuous Infusions:   LOS: 4 days    Time spent: 27 minutes    Sharen Hones, MD [facility]   To contact the attending provider between 7A-7P or the covering provider during after hours 7P-7A, please log into the web site [email or web address] and access using universal [place] password for that web site. If you do not have the password, please call the hospital  operator.  02/28/2021, 1:58 PM

## 2021-02-28 NOTE — Progress Notes (Signed)
°   02/25/21 1030 02/25/21 1045 02/25/21 1100  Vitals  Temp  --   --   --   Temp Source  --   --   --   BP 109/67 111/63 (!) 111/54  MAP (mmHg) 78 76 70  ECG Heart Rate 80 77 77  Resp 19 16 18   During Hemodialysis Assessment  Blood Flow Rate (mL/min) 200 mL/min 200 mL/min 200 mL/min  Arterial Pressure (mmHg) -160 mmHg -100 mmHg -100 mmHg  Venous Pressure (mmHg) 80 mmHg 70 mmHg 140 mmHg  Transmembrane Pressure (mmHg) 70 mmHg 40 mmHg 50 mmHg  Ultrafiltration Rate (mL/min) 510 mL/min 510 mL/min 510 mL/min  Dialysate Flow Rate (mL/min) 300 ml/min 300 ml/min 300 ml/min  Conductivity: Machine  13.8 13.8 13.8  HD Safety Checks Performed Yes Yes Yes  Intra-Hemodialysis Comments Progressing as prescribed Progressing as prescribed (arterial pressure bottoming down again, lines reversed) Progressing as prescribed    02/25/21 1115 02/25/21 1122  Vitals  Temp  --  97.6 F (36.4 C)  Temp Source  --  Oral  BP (!) 111/56  --   MAP (mmHg) 67  --   ECG Heart Rate 83  --   Resp 19  --   During Hemodialysis Assessment  Blood Flow Rate (mL/min) 200 mL/min  --   Arterial Pressure (mmHg) -100 mmHg  --   Venous Pressure (mmHg) 140 mmHg  --   Transmembrane Pressure (mmHg) 50 mmHg  --   Ultrafiltration Rate (mL/min) 510 mL/min  --   Dialysate Flow Rate (mL/min) 300 ml/min  --   Conductivity: Machine  13.8  --   HD Safety Checks Performed Yes  --   Intra-Hemodialysis Comments Progressing as prescribed Tx completed

## 2021-02-28 NOTE — Progress Notes (Signed)
[facility]  ROUNDING NOTE   Subjective:   Patient resting comfortably in chair Alert and oriented Tolerating meals without nausea and vomiting Denies shortness of breath, remains on 4 L [place] [name]  Patient later seen during dialysis   HEMODIALYSIS FLOWSHEET:  Blood Flow Rate (mL/min): 300 mL/min Arterial Pressure (mmHg): -100 mmHg Venous Pressure (mmHg): 90 mmHg Transmembrane Pressure (mmHg): 60 mmHg Ultrafiltration Rate (mL/min): 500 mL/min Dialysate Flow Rate (mL/min): 500 ml/min Conductivity: Machine : 13.9 Conductivity: Machine : 13.9 Dialysis Fluid Bolus: Normal Saline Bolus Amount (mL): 250 mL    Objective:  Vital signs in last 24 hours:  Temp:  [97.8 F (36.6 C)-98.4 F (36.9 C)] 97.8 F (36.6 C) (01/16 0758) Pulse Rate:  [67-85] 77 (01/16 0758) Resp:  [18-26] 18 (01/16 0758) BP: (132-167)/(62-74) 134/64 (01/16 0758) SpO2:  [95 %-100 %] 95 % (01/16 0758)  Weight change:  Filed Weights   02/25/21 0854 02/26/21 0358  Weight: (!) 161 kg (!) 164.7 kg     Intake/Output: I/O last 3 completed shifts: In: 1200 [P.O.:1200] Out: 201 [Urine:201]   Intake/Output this shift:  No intake/output data recorded.  Physical Exam: General: NAD, Sitting in chair  Head: Normocephalic, atraumatic. Moist oral mucosal membranes  Eyes: Anicteric  Lungs:  Diminished bilaterally, 4L [place] O2  Heart: Irregular   Abdomen:  Soft, nontender, obese  Extremities:  2+ peripheral edema.  Neurologic: Nonfocal, moving all four extremities  Access:  RIJ permcath 9/38/18    Basic Metabolic Panel: Recent Labs  Lab 02/23/21 1721 02/24/21 0830 02/25/21 0737 02/26/21 0519 02/28/21 0549  NA 140 145 139 138 136  K 5.0 5.1 6.1* 5.1 4.2  CL 104 108 106 101 101  CO2 22 24 23 25 27   GLUCOSE 128* 174* 139* 134* 177*  BUN 136* 154* 159* 122* 103*  CREATININE 5.82* 6.00* 6.77* 6.03* 6.13*  CALCIUM 9.4 9.2 8.8* 8.7* 8.4*      Liver Function Tests: No results for input(s): AST, ALT, ALKPHOS, BILITOT, PROT, ALBUMIN in the last 168 hours.  No results for input(s): LIPASE, AMYLASE in the last 168 hours. No results for input(s): AMMONIA in the last 168 hours.  CBC: Recent Labs  Lab 02/23/21 1721 02/24/21 0830 02/25/21 0737 02/26/21 0519 02/28/21 0549  WBC 16.6* 15.7* 15.2* 12.2* 12.0*  NEUTROABS  --   --  11.8* 9.3*  --   HGB 8.8* 9.3* 8.3* 8.0* 7.9*  HCT 30.0* 31.2* 28.9* 27.5* 26.2*  MCV 93.5 93.4 96.7 94.2 90.7  PLT 262 254 273 244 265     Cardiac Enzymes: No results for input(s): CKTOTAL, CKMB, CKMBINDEX, TROPONINI in the last 168 hours.  BNP: Invalid input(s): POCBNP  CBG: Recent Labs  Lab 02/27/21 0853 02/27/21 1109 02/27/21 1723 02/27/21 2154 02/28/21 0808  GLUCAP 104* 192* 128* 180* 150*     Microbiology: Results for orders placed or performed during the hospital encounter of 02/23/21  Resp Panel by RT-PCR (Flu A&B, Covid) Nasopharyngeal Swab     Status: None   Collection Time: 02/23/21  9:29 PM   Specimen: Nasopharyngeal Swab; Nasopharyngeal(NP) swabs in vial transport medium  Result Value Ref Range Status   SARS Coronavirus 2 by RT PCR NEGATIVE NEGATIVE Final    Comment: (NOTE) SARS-CoV-2 target nucleic acids are NOT DETECTED.  The SARS-CoV-2 RNA is generally detectable in upper respiratory specimens during the acute phase of infection. The lowest concentration of SARS-CoV-2 viral copies this assay can detect  is 138 copies/mL. A negative result does not preclude SARS-Cov-2 infection and should not be used as the sole basis for treatment or other patient management decisions. A negative result may occur with  improper specimen collection/handling, submission of specimen other than nasopharyngeal swab, presence of viral mutation(s) within the areas targeted by this assay, and inadequate number of viral copies(<138 copies/mL). A negative result must be combined  with clinical observations, patient history, and epidemiological information. The expected result is Negative.  Fact Sheet for Patients:  EntrepreneurPulse.com.au  Fact Sheet for Healthcare Providers:  IncredibleEmployment.be  This test is no t yet approved or cleared by the [place] FDA and  has been authorized for detection and/or diagnosis of SARS-CoV-2 by FDA under an Emergency Use Authorization (EUA). This EUA will remain  in effect (meaning this test can be used) for the duration of the COVID-19 declaration under Section 564(b)(1) of the Act, 21 U.S.C.section 360bbb-3(b)(1), unless the authorization is terminated  or revoked sooner.       Influenza A by PCR NEGATIVE NEGATIVE Final   Influenza B by PCR NEGATIVE NEGATIVE Final    Comment: (NOTE) The Xpert Xpress SARS-CoV-2/FLU/RSV plus assay is intended as an aid in the diagnosis of influenza from Nasopharyngeal swab specimens and should not be used as a sole basis for treatment. Nasal washings and aspirates are unacceptable for Xpert Xpress SARS-CoV-2/FLU/RSV testing.  Fact Sheet for Patients: EntrepreneurPulse.com.au  Fact Sheet for Healthcare Providers: IncredibleEmployment.be  This test is not yet approved or cleared by the [place] FDA and has been authorized for detection and/or diagnosis of SARS-CoV-2 by FDA under an Emergency Use Authorization (EUA). This EUA will remain in effect (meaning this test can be used) for the duration of the COVID-19 declaration under Section 564(b)(1) of the Act, 21 U.S.C. section 360bbb-3(b)(1), unless the authorization is terminated or revoked.  Performed at [facility], [address] [place], [place] [address]     Coagulation Studies: No results for input(s): LABPROT, INR in the last 72 hours.   Urinalysis: No results for input(s): COLORURINE, LABSPEC, PHURINE, GLUCOSEU, HGBUR,  BILIRUBINUR, KETONESUR, PROTEINUR, UROBILINOGEN, NITRITE, LEUKOCYTESUR in the last 72 hours.  Invalid input(s): APPERANCEUR      Imaging: No results found.   Medications:      amLODipine  5 mg Oral BID   apixaban  5 mg Oral BID   atorvastatin  20 mg Oral QHS   calcitRIOL  0.25 mcg Oral Daily   calcium carbonate  500 mg of elemental calcium Oral Q breakfast   Chlorhexidine Gluconate Cloth  6 each Topical Q0600   cloNIDine  0.2 mg Oral BID   folic acid  1,191 mcg Oral Daily   insulin aspart  0-9 Units Subcutaneous TID AC & HS   levothyroxine  112 mcg Oral QAC breakfast   metoprolol tartrate  100 mg Oral BID   Vitamin D (Ergocalciferol)  50,000 Units Oral Q Fri   acetaminophen **OR** acetaminophen, albuterol, magnesium hydroxide, melatonin, morphine injection, ondansetron **OR** ondansetron (ZOFRAN) IV, traZODone  Assessment/ Plan:  Nicole Schmidt is a 56 y.o. white female hypertension, hyperlipidemia, diabetes mellitus type II, diastolic congestive heart failure, hypothyroidism, history of breast cancer, atrial fibrillation and pulmonary embolism who was admitted to [facility] on 02/23/2021 for Cardiorenal syndrome with renal failure [I13.10] Acute on chronic renal failure ([place]) [N17.9, N18.9] AKI (acute kidney injury) ([place]) [N17.9]  Acute Kidney Injury on chronic kidney disease stage IV: baseline creatinine of 2.43 on 01/10/21.  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 Requiring hemodialysis. Dialysis initiated on 02/25/21. Pt receiving third treatment today,seated in chair. Dialysis coordinator pursuing outpatient placement.   Anemia of chronic kidney disease: hemoglobin 7.9. No ESA administered on this admission. Iron levels consistent with iron deficiency. Will order EPO 10000 units IV with dialysis.  Hypertension with chronic kidney disease: 127/71 On amlodipine, clonidine and metoprolol.   Hematuria: with recent episode of  nephrolithiasis.    Secondary Hyperparathyroidism: PTH on 12/30/20 was 72. Currently without binders. - Continue calcitriol and ergocalciferol.   Acute respiratory failure: increased oxygen requirements from her baseline of 2 liters. Secondary to pneumonia and pulmonary edema.    LOS: 4 Jaion Lagrange 1/16/202311:17 AM

## 2021-02-28 NOTE — Progress Notes (Signed)
Patient completed dialysis treatment as ordered. No adverse effects noted or reported. 1 liter removed over a 3 hours period. V/S stable, report given to Edwyna Ready, RN.

## 2021-02-28 NOTE — Progress Notes (Signed)
°   02/25/21 0907 02/25/21 0915 02/25/21 0930  Vitals  BP  --  (!) 122/57 (!) 129/56  MAP (mmHg)  --  74 73  ECG Heart Rate  --  71  --   Resp  --  18  --   During Hemodialysis Assessment  Blood Flow Rate (mL/min) 200 mL/min 200 mL/min 200 mL/min  Arterial Pressure (mmHg) -50 mmHg -60 mmHg -80 mmHg  Venous Pressure (mmHg) 60 mmHg 100 mmHg 70 mmHg  Transmembrane Pressure (mmHg) 50 mmHg 50 mmHg 40 mmHg  Ultrafiltration Rate (mL/min) 500 mL/min 500 mL/min 500 mL/min  Dialysate Flow Rate (mL/min) 300 ml/min 300 ml/min 300 ml/min  Conductivity: Machine  13.8 13.8 13.8  HD Safety Checks Performed Yes Yes Yes  Dialysis Fluid Bolus Normal Saline  --   --   Bolus Amount (mL) 250 mL  --   --   Intra-Hemodialysis Comments Tx initiated (by Helene Kelp RN) Progressing as prescribed Progressing as prescribed    02/25/21 0945 02/25/21 1000 02/25/21 1015  Vitals  BP 110/60 (!) 101/54 (!) 117/56  MAP (mmHg) 72 67 (!) 56  ECG Heart Rate 79 75 71  Resp (!) 21 20 18   During Hemodialysis Assessment  Blood Flow Rate (mL/min) 200 mL/min 200 mL/min 200 mL/min  Arterial Pressure (mmHg) -80 mmHg -170 mmHg -170 mmHg  Venous Pressure (mmHg) 70 mmHg 80 mmHg 80 mmHg  Transmembrane Pressure (mmHg) 40 mmHg 50 mmHg 50 mmHg  Ultrafiltration Rate (mL/min) 500 mL/min 500 mL/min 500 mL/min  Dialysate Flow Rate (mL/min) 300 ml/min 300 ml/min 300 ml/min  Conductivity: Machine  13.8 13.8 13.8  HD Safety Checks Performed Yes Yes Yes  Dialysis Fluid Bolus  --   --   --   Bolus Amount (mL)  --   --   --   Intra-Hemodialysis Comments Progressing as prescribed Progressing as prescribed (lines reversed d/t arterial pressure bottoming down.) Progressing as prescribed

## 2021-02-28 NOTE — Progress Notes (Signed)
PT Cancellation Note  Patient Details Name: Nicole Schmidt MRN: [id number] DOB: 08/03/1965   Cancelled Treatment:    Reason Eval/Treat Not Completed: Patient at procedure or test/unavailable. Patient at HD, will re-attempt later as time allows/patient available.    Fronia Depass 02/28/2021, 12:53 PM

## 2021-02-28 NOTE — TOC Initial Note (Addendum)
Transition of Care [facility]) - Initial/Assessment Note    Patient Details  Name: Nicole Schmidt MRN: [id number] Date of Birth: February 16, 1965  Transition of Care [facility]) CM/SW Contact:    Alberteen Sam, LCSW Phone Number: 02/28/2021, 3:35 PM  Clinical Narrative:                  CSW spoke with patient however she is not sure if she wants to go for CIR or for short term rehab.   CSW has requested MD put in inpatient rehab consult to identify if patient would qualify for inpatient rehab level of care.   Pending assessment at this time.   Patient reports if she does not qualify for CIR she would like to look at short term rehab facilities, and reports she knows she would not like [facility] but would be agreeable for referrals to be sent out in [facility].   TOC will continue to follow for appropriate dispo planning.    Expected Discharge Plan:  (TBD) Barriers to Discharge: Continued Medical Work up   Patient Goals and CMS Choice Patient states their goals for this hospitalization and ongoing recovery are:: to go home CMS Medicare.gov Compare Post Acute Care list provided to:: Patient Choice offered to / list presented to : Patient  Expected Discharge Plan and Services Expected Discharge Plan:  (TBD)       Living arrangements for the past 2 months: Single Family Home                                      Prior Living Arrangements/Services Living arrangements for the past 2 months: Single Family Home Lives with:: Self                   Activities of Daily Living Home Assistive Devices/Equipment: CBG Meter, Oxygen ADL Screening (condition at time of admission) Patient's cognitive ability adequate to safely complete daily activities?: Yes Is the patient deaf or have difficulty hearing?: No Does the patient have difficulty seeing, even when wearing glasses/contacts?: No Does the patient have difficulty concentrating, remembering, or making decisions?: No Patient  able to express need for assistance with ADLs?: Yes Does the patient have difficulty dressing or bathing?: No Independently performs ADLs?: No Communication: Independent Dressing (OT): Independent Grooming: Independent Feeding: Independent Bathing: Needs assistance Is this a change from baseline?: Change from baseline, expected to last <3 days Toileting: Needs assistance Is this a change from baseline?: Change from baseline, expected to last <3 days In/Out Bed: Needs assistance Is this a change from baseline?: Change from baseline, expected to last <3 days Walks in Home: Independent Does the patient have difficulty walking or climbing stairs?: Yes Weakness of Legs: None Weakness of Arms/Hands: None  Permission Sought/Granted                  Emotional Assessment         Alcohol / Substance Use: Not Applicable Psych Involvement: No (comment)  Admission diagnosis:  Cardiorenal syndrome with renal failure [I13.10] Acute on chronic renal failure (HCC) [N17.9, N18.9] AKI (acute kidney injury) ([place]) [N17.9] Patient Active Problem List   Diagnosis Date Noted   Acute on chronic renal failure ([place]) 02/24/2021   Viral URI    Multifocal pneumonia 02/19/2021   Hydronephrosis with renal and ureteral calculus obstruction 02/19/2021   Hyperkalemia 02/19/2021   Cardiorenal syndrome, stage 1-4 or unspecified  chronic kidney disease, with heart failure ([place]) 02/09/2021    Class: Hospitalized for   AKI (acute kidney injury) ([place]) 02/09/2021   Bronchitis 02/09/2021   Cardiorenal syndrome with renal failure 02/09/2021   Chronic respiratory failure with hypoxia ([place]) 01/13/2021   Physical deconditioning 03/16/2020   Abnormal findings on diagnostic imaging of lung 03/16/2020   AF (paroxysmal atrial fibrillation) (HCC)    Acquired thrombophilia ([place])    Acute on chronic respiratory failure with hypoxia ([place]) 02/21/2020   Acute on chronic respiratory failure with hypoxemia ([place])  02/21/2020   Atrial fibrillation, chronic ([place]) 02/09/2020   Dyslipidemia 02/09/2020   Diabetes mellitus type 2 in obese ([place]) 02/09/2020   Acquired hypothyroidism 02/09/2020   Thrush, oral 02/09/2020   Morbid obesity with BMI of 60.0-69.9, adult ([place]) 02/03/2020   Chronic anticoagulation 12/30/2019   Obstructive sleep apnea 08/04/2019   Atrial fibrillation with RVR ([place]) 10/18/2018   Acute hypoxemic respiratory failure ([place]) 03/29/2018   Acute on chronic diastolic CHF (congestive heart failure) ([place]) 02/04/2018   Lymphedema 02/04/2018   Essential hypertension 12/09/2017   Long term (current) use of aromatase inhibitors 11/17/2017   Elevated alkaline phosphatase level 05/20/2017   Goals of care, counseling/discussion 05/14/2017   Chronic renal disease, stage III ([place]) 07/08/2015   Iron deficiency anemia 07/08/2015   Acute renal failure superimposed on stage 4 chronic kidney disease ([place]) 10/22/2014   History of breast cancer 10/22/2014   Elevated troponin 10/22/2014   Pulmonary emboli ([place]) 10/18/2014   History of pulmonary embolus (PE) 10/18/2014   Candidiasis of breast 07/06/2014   Malignant neoplasm of right female breast ([place]) 07/24/2012   PCP:  Lenard Simmer, MD Pharmacy:   [facility] [address], [place] - [place] [address] Phone: [phone] Fax: [phone]  [facility] - [address], [place] [address] Phone: [phone] Fax: [phone]  [facility] [place], [place] [address] [place] [address] Phone: [phone] Fax: [phone]     Social Determinants of Health ([place]) Interventions    Readmission Risk Interventions Readmission Risk Prevention Plan 02/11/2020 10/19/2018  Transportation Screening Complete Complete  PCP or Specialist Appt within 3-5 Days Not Complete -  Not Complete  comments To be scheduled when discharged. -  [place] or Home Care Consult Complete -  Social Work Consult for [place] Planning/Counseling Complete Patient refused  Palliative Care Screening Complete Not Applicable  Medication Review Press photographer) Complete Complete  Some recent data might be hidden

## 2021-02-28 NOTE — Evaluation (Signed)
Occupational Therapy Evaluation Patient Details Name: Nicole Schmidt MRN: [id number] DOB: 1965/06/25 Today's Date: 02/28/2021   History of Present Illness Pt is a 56 y/o F admitted on 02/23/21 after presenting to the ER with worsening LE edema & increased dyspnea. Pt has a permacath placed & started dialysis on 02/25/21. Pt is being treated for AKI superimposed on stage IV CKD, acute on chronic diastolic CHF with volume overload. Pt with recent admission 02/01/21-02/22/21 for management of acute on chronic diastolic CHF & AKI superimposed on CKD stage 3. PMH: DM2, HTN, dyslipidemia, hypothyroidism, gout, HFpEF, a-fib & PE on eliquis, breast CA, obesity   Clinical Impression   Patient presenting with decreased Ind in self care, balance, functional mobility/transfers, endurance, and safety awareness. Patient reports being mod I at baseline and living at home alone. She endorses being on oxygen at home for the last year but has been unable to care for herself since December admission. Pt has been living at her parent's homes but believes it to have mold and does not wish to return. Her home is not as accessible with barriers being steps to enter.  Patient currently functioning at min A with RW for functional mobility and self care. She does fatigue quickly but is very motivated for therapeutic intervention. She would benefit from energy conservation education as well. Pt requesting to get as much therapy as possible in order to return to her home independently. Patient will benefit from acute OT to increase overall independence in the areas of ADLs, functional mobility, and safety awareness in order to safely discharge to next venue of care.      Recommendations for follow up therapy are one component of a multi-disciplinary discharge planning process, led by the attending physician.  Recommendations may be updated based on patient status, additional functional criteria and insurance authorization.   Follow  Up Recommendations  Acute inpatient rehab (3hours/day)    Assistance Recommended at Discharge Frequent or constant Supervision/Assistance  Patient can return home with the following Help with stairs or ramp for entrance;Assistance with cooking/housework;A little help with walking and/or transfers;A little help with bathing/dressing/bathroom;Assist for transportation    Functional Status Assessment  Patient has had a recent decline in their functional status and demonstrates the ability to make significant improvements in function in a reasonable and predictable amount of time.  Equipment Recommendations  Other (comment) (defer to next venue of care)    Recommendations for Other Services Rehab consult     Precautions / Restrictions Precautions Precautions: Fall      Mobility Bed Mobility               General bed mobility comments: not observed, pt received & left sitting in recliner    Transfers Overall transfer level: Needs assistance Equipment used: Rolling walker (2 wheels) Transfers: Sit to/from Stand Sit to Stand: Min guard           General transfer comment: from recliner chair height      Balance   Sitting-balance support: Feet supported Sitting balance-Leahy Scale: Good     Standing balance support: During functional activity;Reliant on assistive device for balance Standing balance-Leahy Scale: Fair                             ADL either performed or assessed with clinical judgement   ADL Overall ADL's : Needs assistance/impaired  Toilet Transfer: Minimal Print production planner Details (indicate cue type and reason): simulated         Functional mobility during ADLs: Minimal assistance;Rolling walker (2 wheels)       Vision Ability to See in Adequate Light: 0 Adequate Patient Visual Report: No change from baseline              Pertinent Vitals/Pain Pain Assessment: No/denies pain      Hand Dominance Right   Extremity/Trunk Assessment Upper Extremity Assessment Upper Extremity Assessment: Generalized weakness   Lower Extremity Assessment Lower Extremity Assessment: Generalized weakness       Communication Communication Communication: No difficulties   Cognition Arousal/Alertness: Awake/alert Behavior During Therapy: WFL for tasks assessed/performed Overall Cognitive Status: Within Functional Limits for tasks assessed                                 General Comments: Motivated to participate & return to PLOF.                Home Living Family/patient expects to be discharged to:: Private residence Living Arrangements: Alone Available Help at Discharge: Family;Friend(s);Available PRN/intermittently Type of Home: House Home Access: Stairs to enter [employer] of Steps: 2 in front and 4 in back Entrance Stairs-Rails: Right;Left Home Layout: One level     Bathroom Shower/Tub: Occupational psychologist: Handicapped height Bathroom Accessibility: Yes   Home Equipment: Cane - single point;Rollator (4 wheels);Rolling Walker (2 wheels);BSC/3in1;Wheelchair - manual;Shower seat   Additional Comments: Pt currently lives in her mother's old home that is 1 level with level entry, but planning to move to a new house as soon as possible that's 1 level with 4 steps to enter with B rails. She does not want to return to mother's home as she believe it has mold.      Prior Functioning/Environment Prior Level of Function : Independent/Modified Independent             Mobility Comments: Pt reports independence without AD, no falls, driving. ADLs Comments: Pt reports independence in self care and IADL tasks        OT Problem List: Decreased activity tolerance;Cardiopulmonary status limiting activity;Decreased strength;Decreased safety awareness;Impaired balance (sitting and/or standing);Decreased knowledge of use of DME or  AE      OT Treatment/Interventions: Self-care/ADL training;Therapeutic exercise;Therapeutic activities;Energy conservation;DME and/or AE instruction;Patient/family education;Manual therapy;Balance training    OT Goals(Current goals can be found in the care plan section) Acute Rehab OT Goals Patient Stated Goal: to go to rehab so I can return home OT Goal Formulation: With patient Time For Goal Achievement: 03/14/21 Potential to Achieve Goals: Good ADL Goals Pt Will Perform Grooming: with modified independence;standing Pt Will Perform Lower Body Dressing: with modified independence;sit to/from stand Pt Will Transfer to Toilet: with modified independence;ambulating Pt Will Perform Toileting - Clothing Manipulation and hygiene: with modified independence;sit to/from stand  OT Frequency: Min 2X/week       AM-PAC OT "6 Clicks" Daily Activity     Outcome Measure Help from another person eating meals?: None Help from another person taking care of personal grooming?: A Little Help from another person toileting, which includes using toliet, bedpan, or urinal?: A Little Help from another person bathing (including washing, rinsing, drying)?: A Little Help from another person to put on and taking off regular upper body clothing?: A Little Help from another person to put on and taking off  regular lower body clothing?: A Little 6 Click Score: 19   End of Session Equipment Utilized During Treatment: Oxygen Nurse Communication: Mobility status  Activity Tolerance: Patient tolerated treatment well Patient left: in chair;with call bell/phone within reach  OT Visit Diagnosis: Unsteadiness on feet (R26.81);Muscle weakness (generalized) (M62.81)                Time: 5825-1898 OT Time Calculation (min): 24 min Charges:  OT General Charges $OT Visit: 1 Visit OT Evaluation $OT Eval Low Complexity: 1 Low OT Treatments $Therapeutic Activity: 8-22 mins  Darleen Crocker, MS, OTR/L , CBIS ascom  [phone]  02/28/21, 2:37 PM

## 2021-03-01 DIAGNOSIS — J9621 Acute and chronic respiratory failure with hypoxia: Secondary | ICD-10-CM | POA: Diagnosis not present

## 2021-03-01 DIAGNOSIS — I5033 Acute on chronic diastolic (congestive) heart failure: Secondary | ICD-10-CM | POA: Diagnosis not present

## 2021-03-01 DIAGNOSIS — N170 Acute kidney failure with tubular necrosis: Secondary | ICD-10-CM | POA: Diagnosis not present

## 2021-03-01 DIAGNOSIS — N184 Chronic kidney disease, stage 4 (severe): Secondary | ICD-10-CM | POA: Diagnosis not present

## 2021-03-01 LAB — CBC WITH DIFFERENTIAL/PLATELET
Abs Immature Granulocytes: 0.22 10*3/uL — ABNORMAL HIGH (ref 0.00–0.07)
Basophils Absolute: 0.1 10*3/uL (ref 0.0–0.1)
Basophils Relative: 0 %
Eosinophils Absolute: 0.2 10*3/uL (ref 0.0–0.5)
Eosinophils Relative: 2 %
HCT: 30.6 % — ABNORMAL LOW (ref 36.0–46.0)
Hemoglobin: 9.3 g/dL — ABNORMAL LOW (ref 12.0–15.0)
Immature Granulocytes: 2 %
Lymphocytes Relative: 11 %
Lymphs Abs: 1.4 10*3/uL (ref 0.7–4.0)
MCH: 27.2 pg (ref 26.0–34.0)
MCHC: 30.4 g/dL (ref 30.0–36.0)
MCV: 89.5 fL (ref 80.0–100.0)
Monocytes Absolute: 1.3 10*3/uL — ABNORMAL HIGH (ref 0.1–1.0)
Monocytes Relative: 11 %
Neutro Abs: 9.2 10*3/uL — ABNORMAL HIGH (ref 1.7–7.7)
Neutrophils Relative %: 74 %
Platelets: 227 10*3/uL (ref 150–400)
RBC: 3.42 MIL/uL — ABNORMAL LOW (ref 3.87–5.11)
RDW: 15.9 % — ABNORMAL HIGH (ref 11.5–15.5)
WBC: 12.4 10*3/uL — ABNORMAL HIGH (ref 4.0–10.5)
nRBC: 0.4 % — ABNORMAL HIGH (ref 0.0–0.2)

## 2021-03-01 LAB — GLUCOSE, CAPILLARY
Glucose-Capillary: 143 mg/dL — ABNORMAL HIGH (ref 70–99)
Glucose-Capillary: 134 mg/dL — ABNORMAL HIGH (ref 70–99)
Glucose-Capillary: 138 mg/dL — ABNORMAL HIGH (ref 70–99)
Glucose-Capillary: 161 mg/dL — ABNORMAL HIGH (ref 70–99)

## 2021-03-01 LAB — RENAL FUNCTION PANEL
Albumin: 2.7 g/dL — ABNORMAL LOW (ref 3.5–5.0)
Anion gap: 13 (ref 5–15)
BUN: 62 mg/dL — ABNORMAL HIGH (ref 6–20)
CO2: 24 mmol/L (ref 22–32)
Calcium: 8.3 mg/dL — ABNORMAL LOW (ref 8.9–10.3)
Chloride: 98 mmol/L (ref 98–111)
Creatinine, Ser: 4.39 mg/dL — ABNORMAL HIGH (ref 0.44–1.00)
GFR, Estimated: 11 mL/min — ABNORMAL LOW (ref >60)
Glucose, Bld: 129 mg/dL — ABNORMAL HIGH (ref 70–99)
Phosphorus: 4.4 mg/dL (ref 2.5–4.6)
Potassium: 4.4 mmol/L (ref 3.5–5.1)
Sodium: 135 mmol/L (ref 135–145)

## 2021-03-01 MED ORDER — LIDOCAINE HCL (PF) 1 % IJ SOLN
5.0000 mL | INTRAMUSCULAR | Status: DC | PRN
  Filled 2021-03-01: qty 5

## 2021-03-01 MED ORDER — HEPARIN SODIUM (PORCINE) 1000 UNIT/ML DIALYSIS
1000.0000 [IU] | INTRAMUSCULAR | Status: DC | PRN
  Filled 2021-03-01: qty 1

## 2021-03-01 MED ORDER — PENTAFLUOROPROP-TETRAFLUOROETH EX AERO
1.0000 "application " | INHALATION_SPRAY | CUTANEOUS | Status: DC | PRN
  Filled 2021-03-01: qty 30

## 2021-03-01 MED ORDER — SODIUM CHLORIDE 0.9 % IV SOLN: 100.0000 mL | INTRAVENOUS | Status: DC | PRN

## 2021-03-01 MED ORDER — ALTEPLASE 2 MG IJ SOLR: 2.0000 mg | Freq: Once | INTRAMUSCULAR | Status: DC | PRN

## 2021-03-01 MED ORDER — LIDOCAINE-PRILOCAINE 2.5-2.5 % EX CREA
1.0000 "application " | TOPICAL_CREAM | CUTANEOUS | Status: DC | PRN
  Filled 2021-03-01: qty 5

## 2021-03-01 NOTE — Progress Notes (Addendum)
PROGRESS NOTE    Nicole Schmidt  LMB:[id number] DOB: 1965-08-06 DOA: 02/23/2021 PCP: Lenard Simmer, MD   Chief complaint.  Shortness of breath. Brief Narrative:  Nicole Schmidt is a 56 y.o. Caucasian female with medical history significant for type 2 diabetes mellitus, hypertension, dyslipidemia, hypothyroidism, gout, HFpEF, atrial fibrillation and PE on Eliquis, who had a recent admission here from 02/01/21 till 02/22/21 during which she was managed for acute on chronic diastolic CHF and AKI superimposed on CKD stage III.  She presents to the ER with worsening lower extremity edema as well as increased dyspnea.  Patient has a permacath placed and started dialysis on 1/13.   Assessment & Plan:   Principal Problem:   Acute on chronic renal failure (HCC) Active Problems:   Acute renal failure superimposed on stage 4 chronic kidney disease (HCC)   Acute on chronic diastolic CHF (congestive heart failure) (HCC)   Obstructive sleep apnea   Morbid obesity with BMI of 60.0-69.9, adult (HCC)   Diabetes mellitus type 2 in obese ([place])   Acute on chronic respiratory failure with hypoxemia (HCC)   AF (paroxysmal atrial fibrillation) (HCC)   Chronic respiratory failure with hypoxia (HCC)  Acute kidney injury superimposed on stage IV chronic kidney disease. Acute on chronic diastolic congestive heart failure with volume overload. Anemia of chronic kidney disease. Patient has a permacath placed, so far has received 3 sessions of HD.  Condition is more stable, she is currently pending for CIR assessment.   Acute on chronic hypoxemic respiratory failure. Ruled in Morbid obesity. Obstructive sleep apnea Patient tolerated 5 hours of CPAP last night.  She is on 2 L oxygen at baseline, currently still on 4 L oxygen.  Her volume status is better with dialysis.  We will continue wean oxygen.   Generalized weakness. Patient will be assessed by CIR.  Paroxysmal atrial fibrillation. Continue  anticoagulation.  Essential hypertension. Continue current regimen   Hematuria with recent ureteral stent. Per nephrology and urology, no additional treatment is needed.   DVT prophylaxis: Eliquis Code Status: full Family Communication:  Disposition Plan:      Status is: Inpatient   Remains inpatient appropriate because: Severity of disease, unsafe discharge.     I/O last 3 completed shifts: In: 240 [P.O.:240] Out: 1007 [Urine:351; Other:1000] No intake/output data recorded.     Consultants:  Nephrology  Procedures: HD  Antimicrobials: None  Subjective: Patient is still on 4 L oxygen, short of breath with exertion.  She was able to tolerate CPAP for 5 hours last night. She has no cough. No abdominal pain or nausea vomiting.  Objective: Vitals:   02/28/21 2300 03/01/21 0328 03/01/21 0500 03/01/21 0746  BP: (!) 154/85 127/72  (!) 147/60  Pulse: 85 83  84  Resp: 17 18  20   Temp: 97.8 F (36.6 C) 98.5 F (36.9 C)  97.9 F (36.6 C)  TempSrc:  Oral  Oral  SpO2: 99% 97%  96%  Weight:   (!) 162.6 kg   Height:        Intake/Output Summary (Last 24 hours) at 03/01/2021 1228 Last data filed at 03/01/2021 0100 Gross per 24 hour  Intake 240 ml  Output 1150 ml  Net -910 ml   Filed Weights   02/26/21 0358 03/01/21 0500  Weight: (!) 164.7 kg (!) 162.6 kg    Examination:  General exam: Appears calm and comfortable  Respiratory system: Clear to auscultation. Respiratory effort normal. Cardiovascular system: S1 & S2 heard,  RRR. No JVD, murmurs, rubs, gallops or clicks.  Gastrointestinal system: Abdomen is nondistended, soft and nontender. No organomegaly or masses felt. Normal bowel sounds heard. Central nervous system: Alert and oriented. No focal neurological deficits. Extremities: Symmetric 5 x 5 power. Skin: 3+ leg edema Psychiatry: Judgement and insight appear normal. Mood & affect appropriate.     Data Reviewed: I have personally reviewed following  labs and imaging studies  CBC: Recent Labs  Lab 02/24/21 0830 02/25/21 0737 02/26/21 0519 02/28/21 0549 03/01/21 0719  WBC 15.7* 15.2* 12.2* 12.0* 12.4*  NEUTROABS  --  11.8* 9.3*  --  9.2*  HGB 9.3* 8.3* 8.0* 7.9* 9.3*  HCT 31.2* 28.9* 27.5* 26.2* 30.6*  MCV 93.4 96.7 94.2 90.7 89.5  PLT 254 273 244 265 299   Basic Metabolic Panel: Recent Labs  Lab 02/24/21 0830 02/25/21 0737 02/26/21 0519 02/28/21 0549 03/01/21 0719  NA 145 139 138 136 135  K 5.1 6.1* 5.1 4.2 4.4  CL 108 106 101 101 98  CO2 24 23 25 27 24   GLUCOSE 174* 139* 134* 177* 129*  BUN 154* 159* 122* 103* 62*  CREATININE 6.00* 6.77* 6.03* 6.13* 4.39*  CALCIUM 9.2 8.8* 8.7* 8.4* 8.3*  PHOS  --   --   --   --  4.4   GFR: Estimated Creatinine Clearance: 21.9 mL/min (A) (by C-G formula based on SCr of 4.39 mg/dL (H)). Liver Function Tests: Recent Labs  Lab 03/01/21 0719  ALBUMIN 2.7*   No results for input(s): LIPASE, AMYLASE in the last 168 hours. No results for input(s): AMMONIA in the last 168 hours. Coagulation Profile: No results for input(s): INR, PROTIME in the last 168 hours. Cardiac Enzymes: No results for input(s): CKTOTAL, CKMB, CKMBINDEX, TROPONINI in the last 168 hours. BNP (last 3 results) No results for input(s): PROBNP in the last 8760 hours. HbA1C: No results for input(s): HGBA1C in the last 72 hours. CBG: Recent Labs  Lab 02/27/21 2154 02/28/21 0808 02/28/21 1807 02/28/21 2021 03/01/21 0737  GLUCAP 180* 150* 243* 173* 143*   Lipid Profile: No results for input(s): CHOL, HDL, LDLCALC, TRIG, CHOLHDL, LDLDIRECT in the last 72 hours. Thyroid Function Tests: No results for input(s): TSH, T4TOTAL, FREET4, T3FREE, THYROIDAB in the last 72 hours. Anemia Panel: No results for input(s): VITAMINB12, FOLATE, FERRITIN, TIBC, IRON, RETICCTPCT in the last 72 hours. Sepsis Labs: No results for input(s): PROCALCITON, LATICACIDVEN in the last 168 hours.  Recent Results (from the past 240  hour(s))  Resp Panel by RT-PCR (Flu A&B, Covid) Nasopharyngeal Swab     Status: None   Collection Time: 02/23/21  9:29 PM   Specimen: Nasopharyngeal Swab; Nasopharyngeal(NP) swabs in vial transport medium  Result Value Ref Range Status   SARS Coronavirus 2 by RT PCR NEGATIVE NEGATIVE Final    Comment: (NOTE) SARS-CoV-2 target nucleic acids are NOT DETECTED.  The SARS-CoV-2 RNA is generally detectable in upper respiratory specimens during the acute phase of infection. The lowest concentration of SARS-CoV-2 viral copies this assay can detect is 138 copies/mL. A negative result does not preclude SARS-Cov-2 infection and should not be used as the sole basis for treatment or other patient management decisions. A negative result may occur with  improper specimen collection/handling, submission of specimen other than nasopharyngeal swab, presence of viral mutation(s) within the areas targeted by this assay, and inadequate number of viral copies(<138 copies/mL). A negative result must be combined with clinical observations, patient history, and epidemiological information. The expected result is  Negative.  Fact Sheet for Patients:  EntrepreneurPulse.com.au  Fact Sheet for Healthcare Providers:  IncredibleEmployment.be  This test is no t yet approved or cleared by the [place] FDA and  has been authorized for detection and/or diagnosis of SARS-CoV-2 by FDA under an Emergency Use Authorization (EUA). This EUA will remain  in effect (meaning this test can be used) for the duration of the COVID-19 declaration under Section 564(b)(1) of the Act, 21 U.S.C.section 360bbb-3(b)(1), unless the authorization is terminated  or revoked sooner.       Influenza A by PCR NEGATIVE NEGATIVE Final   Influenza B by PCR NEGATIVE NEGATIVE Final    Comment: (NOTE) The Xpert Xpress SARS-CoV-2/FLU/RSV plus assay is intended as an aid in the diagnosis of influenza from  Nasopharyngeal swab specimens and should not be used as a sole basis for treatment. Nasal washings and aspirates are unacceptable for Xpert Xpress SARS-CoV-2/FLU/RSV testing.  Fact Sheet for Patients: EntrepreneurPulse.com.au  Fact Sheet for Healthcare Providers: IncredibleEmployment.be  This test is not yet approved or cleared by the [place] FDA and has been authorized for detection and/or diagnosis of SARS-CoV-2 by FDA under an Emergency Use Authorization (EUA). This EUA will remain in effect (meaning this test can be used) for the duration of the COVID-19 declaration under Section 564(b)(1) of the Act, 21 U.S.C. section 360bbb-3(b)(1), unless the authorization is terminated or revoked.  Performed at [facility], [address], [place], [place] [address]          Radiology Studies: No results found.      Scheduled Meds:  amLODipine  5 mg Oral BID   apixaban  5 mg Oral BID   atorvastatin  20 mg Oral QHS   calcitRIOL  0.25 mcg Oral Daily   calcium carbonate  500 mg of elemental calcium Oral Q breakfast   Chlorhexidine Gluconate Cloth  6 each Topical Q0600   cloNIDine  0.2 mg Oral BID   epoetin (EPOGEN/PROCRIT) injection  10,000 Units Intravenous Q M,W,F-HD   folic acid  4,008 mcg Oral Daily   insulin aspart  0-9 Units Subcutaneous TID AC & HS   levothyroxine  112 mcg Oral QAC breakfast   metoprolol tartrate  100 mg Oral BID   Vitamin D (Ergocalciferol)  50,000 Units Oral Q Fri   Continuous Infusions:  sodium chloride     sodium chloride       LOS: 5 days    Time spent: 28 minutes    Sharen Hones, MD [facility]   To contact the attending provider between 7A-7P or the covering provider during after hours 7P-7A, please log into the web site [email or web address] and access using universal [place] password for that web site. If you do not have the password, please call the hospital  operator.  03/01/2021, 12:28 PM

## 2021-03-01 NOTE — Progress Notes (Signed)
Inpatient Rehab Admissions Coordinator:   Spoke with patient and her cousin over the phone to discuss CIR recommendations as well as goals/expectations of a CIR stay. I let them know that average length of stay is about 2 weeks, though varies based on progress, and that patients receive 3 hrs/day of therapy.  Pt would have MD visits each day, and be able to complete dialysis easily with [name] (on the 6th floor).  Pt would need to reach mod I level to be able to safely return home as family is not able to provide 24/7 supervision.  We did discuss alternatives to family (friends, hired care givers, etc).  They would like to proceed with prior auth request so I will start that this afternoon and continue to update as able.   Shann Medal, PT, DPT Admissions Coordinator [phone] 03/01/21  3:36 PM

## 2021-03-01 NOTE — Progress Notes (Signed)
Patient accepted at [facility] 10:45am. Patient is aware of this schedule, due to still planning discharge disposition, start date will vary according to plan. Please contact me when patient is discharge ready to set start date for in-center hemodialysis.

## 2021-03-01 NOTE — Progress Notes (Signed)
[facility]  ROUNDING NOTE   Subjective:   Patient seen sitting up in chair Tolerating meals appropriately Anxious of discharge plan to rehab facility.  Received dialysis yesterday in chair, tolerated well Edema remains  Creatinine 4.39  Objective:  Vital signs in last 24 hours:  Temp:  [97.6 F (36.4 C)-98.5 F (36.9 C)] 97.6 F (36.4 C) (01/17 1235) Pulse Rate:  [67-92] 73 (01/17 1235) Resp:  [17-20] 18 (01/17 1235) BP: (72-154)/(47-116) 129/60 (01/17 1235) SpO2:  [86 %-100 %] 97 % (01/17 1235) Weight:  [162.6 kg] 162.6 kg (01/17 0500)  Weight change:  Filed Weights   02/26/21 0358 03/01/21 0500  Weight: (!) 164.7 kg (!) 162.6 kg     Intake/Output: I/O last 3 completed shifts: In: 240 [P.O.:240] Out: 5027 [Urine:351; Other:1000]   Intake/Output this shift:  No intake/output data recorded.  Physical Exam: General: NAD, Sitting in chair  Head: Normocephalic, atraumatic. Moist oral mucosal membranes  Eyes: Anicteric  Lungs:  Diminished bilaterally, 3.5L [place] O2  Heart: Irregular   Abdomen:  Soft, nontender, obese  Extremities:  1+ peripheral edema.  Neurologic: Nonfocal, moving all four extremities  Access:  RIJ permcath 7/41/28    Basic Metabolic Panel: Recent Labs  Lab 02/24/21 0830 02/25/21 0737 02/26/21 0519 02/28/21 0549 03/01/21 0719  NA 145 139 138 136 135  K 5.1 6.1* 5.1 4.2 4.4  CL 108 106 101 101 98  CO2 24 23 25 27 24   GLUCOSE 174* 139* 134* 177* 129*  BUN 154* 159* 122* 103* 62*  CREATININE 6.00* 6.77* 6.03* 6.13* 4.39*  CALCIUM 9.2 8.8* 8.7* 8.4* 8.3*  PHOS  --   --   --   --  4.4     Liver Function Tests: Recent Labs  Lab 03/01/21 0719  ALBUMIN 2.7*    No results for input(s): LIPASE, AMYLASE in the last 168 hours. No results for input(s): AMMONIA in the last 168 hours.  CBC: Recent Labs  Lab 02/24/21 0830 02/25/21 0737 02/26/21 0519 02/28/21 0549 03/01/21 0719  WBC 15.7* 15.2* 12.2* 12.0* 12.4*   NEUTROABS  --  11.8* 9.3*  --  9.2*  HGB 9.3* 8.3* 8.0* 7.9* 9.3*  HCT 31.2* 28.9* 27.5* 26.2* 30.6*  MCV 93.4 96.7 94.2 90.7 89.5  PLT 254 273 244 265 227     Cardiac Enzymes: No results for input(s): CKTOTAL, CKMB, CKMBINDEX, TROPONINI in the last 168 hours.  BNP: Invalid input(s): POCBNP  CBG: Recent Labs  Lab 02/28/21 0808 02/28/21 1807 02/28/21 2021 03/01/21 0737 03/01/21 1229  GLUCAP 150* 243* 173* 143* 134*     Microbiology: Results for orders placed or performed during the hospital encounter of 02/23/21  Resp Panel by RT-PCR (Flu A&B, Covid) Nasopharyngeal Swab     Status: None   Collection Time: 02/23/21  9:29 PM   Specimen: Nasopharyngeal Swab; Nasopharyngeal(NP) swabs in vial transport medium  Result Value Ref Range Status   SARS Coronavirus 2 by RT PCR NEGATIVE NEGATIVE Final    Comment: (NOTE) SARS-CoV-2 target nucleic acids are NOT DETECTED.  The SARS-CoV-2 RNA is generally detectable in upper respiratory specimens during the acute phase of infection. The lowest concentration of SARS-CoV-2 viral copies this assay can detect is 138 copies/mL. A negative result does not preclude SARS-Cov-2 infection and should not be used as the sole basis for treatment or other patient management decisions. A negative result may occur with  improper specimen collection/handling, submission of specimen other than nasopharyngeal swab, presence of viral  mutation(s) within the areas targeted by this assay, and inadequate number of viral copies(<138 copies/mL). A negative result must be combined with clinical observations, patient history, and epidemiological information. The expected result is Negative.  Fact Sheet for Patients:  EntrepreneurPulse.com.au  Fact Sheet for Healthcare Providers:  IncredibleEmployment.be  This test is no t yet approved or cleared by the [place] FDA and  has been authorized for detection and/or  diagnosis of SARS-CoV-2 by FDA under an Emergency Use Authorization (EUA). This EUA will remain  in effect (meaning this test can be used) for the duration of the COVID-19 declaration under Section 564(b)(1) of the Act, 21 U.S.C.section 360bbb-3(b)(1), unless the authorization is terminated  or revoked sooner.       Influenza A by PCR NEGATIVE NEGATIVE Final   Influenza B by PCR NEGATIVE NEGATIVE Final    Comment: (NOTE) The Xpert Xpress SARS-CoV-2/FLU/RSV plus assay is intended as an aid in the diagnosis of influenza from Nasopharyngeal swab specimens and should not be used as a sole basis for treatment. Nasal washings and aspirates are unacceptable for Xpert Xpress SARS-CoV-2/FLU/RSV testing.  Fact Sheet for Patients: EntrepreneurPulse.com.au  Fact Sheet for Healthcare Providers: IncredibleEmployment.be  This test is not yet approved or cleared by the [place] FDA and has been authorized for detection and/or diagnosis of SARS-CoV-2 by FDA under an Emergency Use Authorization (EUA). This EUA will remain in effect (meaning this test can be used) for the duration of the COVID-19 declaration under Section 564(b)(1) of the Act, 21 U.S.C. section 360bbb-3(b)(1), unless the authorization is terminated or revoked.  Performed at [facility], [address] [place], [place] [address]     Coagulation Studies: No results for input(s): LABPROT, INR in the last 72 hours.   Urinalysis: No results for input(s): COLORURINE, LABSPEC, PHURINE, GLUCOSEU, HGBUR, BILIRUBINUR, KETONESUR, PROTEINUR, UROBILINOGEN, NITRITE, LEUKOCYTESUR in the last 72 hours.  Invalid input(s): APPERANCEUR      Imaging: No results found.   Medications:    sodium chloride     sodium chloride       amLODipine  5 mg Oral BID   apixaban  5 mg Oral BID   atorvastatin  20 mg Oral QHS   calcitRIOL  0.25 mcg Oral Daily   calcium carbonate  500 mg of  elemental calcium Oral Q breakfast   Chlorhexidine Gluconate Cloth  6 each Topical Q0600   cloNIDine  0.2 mg Oral BID   epoetin (EPOGEN/PROCRIT) injection  10,000 Units Intravenous Q M,W,F-HD   folic acid  6,712 mcg Oral Daily   insulin aspart  0-9 Units Subcutaneous TID AC & HS   levothyroxine  112 mcg Oral QAC breakfast   metoprolol tartrate  100 mg Oral BID   Vitamin D (Ergocalciferol)  50,000 Units Oral Q Fri   sodium chloride, sodium chloride, acetaminophen **OR** acetaminophen, albuterol, alteplase, heparin, lidocaine (PF), lidocaine-prilocaine, magnesium hydroxide, melatonin, morphine injection, ondansetron **OR** ondansetron (ZOFRAN) IV, pentafluoroprop-tetrafluoroeth, traZODone  Assessment/ Plan:  [name] is a 56 y.o. white female hypertension, hyperlipidemia, diabetes mellitus type II, diastolic congestive heart failure, hypothyroidism, history of breast cancer, atrial fibrillation and pulmonary embolism who was admitted to [facility] on 02/23/2021 for Cardiorenal syndrome with renal failure [I13.10] Acute on chronic renal failure ([place]) [N17.9, N18.9] AKI (acute kidney injury) ([place]) [N17.9]  Acute Kidney Injury on chronic kidney disease stage IV: baseline creatinine of 2.43 on 01/10/21. 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  Requiring hemodialysis. Dialysis initiated on 02/25/21.  Dialysis received yesterday with 1 L UF achieved.  Patient tolerated well.  Next treatment scheduled for tomorrow with low UF to monitor for renal recovery.  Appreciate dialysis coordinator arranging outpatient treatment at [facility] on MWF schedule.  Awaiting discharge and rehab planning to determine start date with outpatient clinic.  Anemia of chronic kidney disease: hemoglobin 9.3. No ESA administered on this admission. Iron levels consistent with iron deficiency.  Hemoglobin within target.  We will hold ESA at this time  Hypertension with chronic  kidney disease: 129/60 On amlodipine, clonidine and metoprolol.   Hematuria: Ureteral stent placed by urology on 02/14/2021.  With recent episode of nephrolithiasis.  Intermittent hematuria episode  Secondary Hyperparathyroidism: PTH on 12/30/20 was 72. Currently without binders. - Continue calcitriol and ergocalciferol.   Acute respiratory failure: increased oxygen requirements from her baseline of 2 liters. Secondary to pneumonia and pulmonary edema.  Remains on 3.5 L [place]   LOS: 5 Levert Heslop 1/17/202312:36 PM

## 2021-03-01 NOTE — Progress Notes (Signed)
Physical Therapy Treatment Patient Details Name: Nicole Schmidt MRN: [id number] DOB: [birth year] Today's Date: 03/01/2021   History of Present Illness Pt is a 56 y/o F admitted on 02/23/21 after presenting to the ER with worsening LE edema & increased dyspnea. Pt has a permacath placed & started dialysis on 02/25/21. Pt is being treated for AKI superimposed on stage IV CKD, acute on chronic diastolic CHF with volume overload. Pt with recent admission 02/01/21-02/22/21 for management of acute on chronic diastolic CHF & AKI superimposed on CKD stage 3. PMH: DM2, HTN, dyslipidemia, hypothyroidism, gout, HFpEF, a-fib & PE on eliquis, breast CA, obesity    PT Comments    Pt is making good progress towards goals and is very motivated to become independent like PLOF. Pt with increased pain in B feet today limiting progress with therapy. All mobility performed on 3 L of O2 with sats slightly decreasing with exertion. Poor power noted with inability to stand without B UE support on chair. Pt interested in CIR, updating recs to reflect recommendation as pt has excellent potential to reach goals and continue living independently. Will continue to progress. Time spent with care coordination.  Recommendations for follow up therapy are one component of a multi-disciplinary discharge planning process, led by the attending physician.  Recommendations may be updated based on patient status, additional functional criteria and insurance authorization.  Follow Up Recommendations  Acute inpatient rehab (3hours/day)     Assistance Recommended at Discharge Intermittent Supervision/Assistance  Patient can return home with the following Help with stairs or ramp for entrance;Assist for transportation;Assistance with cooking/housework;A little help with walking and/or transfers;A little help with bathing/dressing/bathroom   Equipment Recommendations  None recommended by PT    Recommendations for Other Services        Precautions / Restrictions Precautions Precautions: Fall Restrictions Weight Bearing Restrictions: No     Mobility  Bed Mobility               General bed mobility comments: not performed as pt received in recliner at beginning/end of session    Transfers Overall transfer level: Needs assistance Equipment used: Rolling walker (2 wheels) Transfers: Sit to/from Stand Sit to Stand: Supervision           General transfer comment: needs B UE to push from chair. Once standing, reports 8/10 pain with pressure on B feet    Ambulation/Gait Ambulation/Gait assistance: Min guard Gait Distance (Feet): 10 Feet Assistive device: Rolling walker (2 wheels) Gait Pattern/deviations: Step-to pattern       General Gait Details: short step to gait pattern with heavy WBing on B arms, limited by pain. All mobility performed on 3L of O2 with sats decreasing to 88% and SOB symptoms.   Stairs             Wheelchair Mobility    Modified Rankin (Stroke Patients Only)       Balance Overall balance assessment: Mild deficits observed, not formally tested Sitting-balance support: Feet supported Sitting balance-Leahy Scale: Good       Standing balance-Leahy Scale: Fair                              Cognition Arousal/Alertness: Awake/alert Behavior During Therapy: WFL for tasks assessed/performed Overall Cognitive Status: Within Functional Limits for tasks assessed  General Comments: motivated to participate, however is limited by pain        Exercises Other Exercises Other Exercises: 5 time sit<>Stand performed in 13 seconds. Unable to perform stand without use of hands. Other Exercises: B LE seated ther-ex including B LE LAQ with heels together and alt. LAQ. 10 reps performed with O2 sats WNL    General Comments        Pertinent Vitals/Pain Pain Assessment Pain Assessment: 0-10 Pain Score: 7  Pain  Location: B feet Pain Descriptors / Indicators: Discomfort, Pressure Pain Intervention(s): Limited activity within patient's tolerance, Repositioned    Home Living                          Prior Function            PT Goals (current goals can now be found in the care plan section) Acute Rehab PT Goals Patient Stated Goal: get better, return to PLOF PT Goal Formulation: With patient Time For Goal Achievement: 03/13/21 Potential to Achieve Goals: Good Progress towards PT goals: Progressing toward goals    Frequency    Min 2X/week      PT Plan Discharge plan needs to be updated    Co-evaluation              AM-PAC PT "6 Clicks" Mobility   Outcome Measure  Help needed turning from your back to your side while in a flat bed without using bedrails?: None Help needed moving from lying on your back to sitting on the side of a flat bed without using bedrails?: A Little Help needed moving to and from a bed to a chair (including a wheelchair)?: A Little Help needed standing up from a chair using your arms (e.g., wheelchair or bedside chair)?: A Little Help needed to walk in hospital room?: A Lot Help needed climbing 3-5 steps with a railing? : A Lot 6 Click Score: 17    End of Session Equipment Utilized During Treatment: Oxygen Activity Tolerance: Patient tolerated treatment well Patient left: in chair;with call bell/phone within reach Nurse Communication: Mobility status PT Visit Diagnosis: Unsteadiness on feet (R26.81);Muscle weakness (generalized) (M62.81);Difficulty in walking, not elsewhere classified (R26.2)     Time: 1345-1410 PT Time Calculation (min) (ACUTE ONLY): 25 min  Charges:  $Gait Training: 8-22 mins $Therapeutic Exercise: 8-22 mins                     Greggory Stallion, PT, DPT, GCS [phone]    Nicole Schmidt 03/01/2021, 3:11 PM

## 2021-03-02 ENCOUNTER — Encounter: Payer: Self-pay | Admitting: Internal Medicine

## 2021-03-02 DIAGNOSIS — J9621 Acute and chronic respiratory failure with hypoxia: Secondary | ICD-10-CM | POA: Diagnosis not present

## 2021-03-02 DIAGNOSIS — N186 End stage renal disease: Secondary | ICD-10-CM

## 2021-03-02 DIAGNOSIS — I5033 Acute on chronic diastolic (congestive) heart failure: Secondary | ICD-10-CM | POA: Diagnosis not present

## 2021-03-02 LAB — GLUCOSE, CAPILLARY
Glucose-Capillary: 101 mg/dL — ABNORMAL HIGH (ref 70–99)
Glucose-Capillary: 148 mg/dL — ABNORMAL HIGH (ref 70–99)

## 2021-03-02 MED ORDER — HEPARIN SODIUM (PORCINE) 1000 UNIT/ML IJ SOLN
INTRAMUSCULAR | Status: AC
  Filled 2021-03-02: qty 1

## 2021-03-02 NOTE — PMR Pre-admission (Signed)
PMR Admission Coordinator Pre-Admission Assessment  Patient: Nicole Schmidt is an 56 y.o., female MRN: [id number] DOB: Jul 21, 1965 Height: 5' 4" (162.6 cm) Weight: (!) 164.3 kg  Insurance Information HMO:     PPO: Yes     PCP:      IPA:      80/20:      OTHER:  PRIMARY: BCBS      Policy#: [id number]      Subscriber: ptt CM Name: Wynelle Bourgeois      Phone#: [phone]     Fax#: [phone] Pre-Cert#: [id number] auth for CIR from portal (updated by Wynelle Bourgeois) with updates due to fax listed above on 1/26.       Employer:  Benefits:  Phone #: [phone]     Name:  Eff. Date: 02/18/19     Deduct: $200 ($0 met)      Out of Pocket Max: $2000 ($0 met)      Life Max: n/a CIR: 80%      SNF: 80% Outpatient: 80%     Co Ins: 20% Home Health: 80%      Co-Ins: 20% DME: 80%     Co-Ins: 20% Providers:  SECONDARY:       Policy#:      Phone#:   Development worker, community:       Phone#:   The Actuary for patients in Inpatient Rehabilitation Facilities with attached Privacy Act [place] Records was provided and verbally reviewed with: N/A  Emergency Contact Information Contact Information     Name Relation Home Work [place] Relative [phone]  [phone]   Bowman,Connie Relative [phone]         Current Medical History  Patient Admitting Diagnosis: debility, new HD  History of Present Illness: [name] is a 56 year old right-handed female with history of chronic hypoxemic respiratory failure on 2 L oxygen, diabetes mellitus, breast cancer, history of hematuria left ureteral stone with recent left uteroscopy with laser lithotripsy and stent exchange ureteral stent per Dr.Sninsky, diabetes mellitus, hypertension, hyperlipidemia, gout, morbid obesity with BMI 62.17, atrial fibrillation/pulmonary emboli maintained on Eliquis.  Recent admission 02/01/2021 - 02/22/2021 for management of acute on chronic diastolic congestive heart failure and AKI  superimposed on CKD stage III.  Presented to [facility] 02/23/2021 ER with increasing lower extremity edema and increasing shortness of breath and diminished urine output.  Chest x-ray showed cardiomegaly subtle prominence of interstitial markings in the perihilar regions more so on the right side possibly suggesting mild asymmetric interstitial pulmonary edema.  Noted interval decrease in patchy infiltrates in the perihilar regions and lower lung fields suggesting resolving atelectasis/pneumonia.  Admission chemistries glucose 128 BUN 136 creatinine 5.82, WBC 16,600 hemoglobin 8.8, troponin 10, BNP 375.  Follow-up nephrology services permacath was placed 02/25/2021 hemodialysis initiated for ESRD and fluid overload and latest creatinine 4.39.  She remains on chronic Eliquis for history of atrial fibrillation pulmonary emboli.  Therapy evaluations completed due to patient decreased functional mobility was recommended for a comprehensive rehab program.    Patient's medical record from [facility] has been reviewed by the rehabilitation admission coordinator and physician.  Past Medical History  Past Medical History:  Diagnosis Date   (HFpEF) heart failure with preserved ejection fraction ([place])    a. 10/2018 Echo: EF 60-65%, diast dysfxn, RVSP 50.7 mmHg, mildly dil LA.   Acquired hypothyroidism 02/09/2020   Anemia    Arthritis    Breast cancer ([place]) 2014   right breast cancer  Breast cancer of upper-inner quadrant of right female breast ([place])    Right breast invasive CA and DCIS , 7 mm T1,N0,M0. Er/PR pos, her 2 negative.  Margins 1 mm.   CHF (congestive heart failure) (HCC)    CKD (chronic kidney disease), stage III (HCC)    Diabetes mellitus type 2 in obese ([place]) 02/09/2020   Diabetes mellitus without complication ([place])    Dyslipidemia 02/09/2020   Essential hypertension    Gout    Hypothyroidism    Menopause    age 55   Morbid obesity ([place])    Persistent atrial fibrillation ([place])     a. Dx 10/2018 in setting of PNA. CHA2DS2VASc = 4-->Eliquis; b. 11/2018 s/p successful DCCV.   Personal history of radiation therapy    Pulmonary embolism ([place]) 10/2014   a. Chronic eliquis.   Thyroid goiter     Has the patient had major surgery during 100 days prior to admission? Yes  Family History   family history includes Atrial fibrillation in her mother; Colon cancer (age of onset: 52) in her cousin; Diabetes in her father; Heart attack in her mother; Heart failure in her mother; Hypertension in her father and mother; Parkinson's disease in her father; Rheum arthritis in her mother.  Current Medications  Current Facility-Administered Medications:    0.9 %  sodium chloride infusion (Manually program via Guardrails IV Fluids), , Intravenous, Once, Jennye Boroughs, MD   acetaminophen (TYLENOL) tablet 650 mg, 650 mg, Oral, Q6H PRN, 650 mg at 03/04/21 0345 **OR** acetaminophen (TYLENOL) suppository 650 mg, 650 mg, Rectal, Q6H PRN, Lucky Cowboy, Erskine Squibb, MD   albuterol (PROVENTIL) (2.5 MG/3ML) 0.083% nebulizer solution 2.5 mg, 2.5 mg, Nebulization, Q6H PRN, Lucky Cowboy, Erskine Squibb, MD   amLODipine (NORVASC) tablet 5 mg, 5 mg, Oral, BID, Dew, Erskine Squibb, MD, 5 mg at 03/03/21 2138   apixaban (ELIQUIS) tablet 5 mg, 5 mg, Oral, BID, Dew, Erskine Squibb, MD, 5 mg at 03/04/21 2458   atorvastatin (LIPITOR) tablet 20 mg, 20 mg, Oral, QHS, Dew, Erskine Squibb, MD, 20 mg at 03/03/21 2138   bisacodyl (DULCOLAX) suppository 10 mg, 10 mg, Rectal, Once, Jennye Boroughs, MD   calcitRIOL (ROCALTROL) capsule 0.25 mcg, 0.25 mcg, Oral, Daily, Dew, Erskine Squibb, MD, 0.25 mcg at 03/04/21 0998   calcium carbonate (OS-CAL - dosed in mg of elemental calcium) tablet 500 mg of elemental calcium, 500 mg of elemental calcium, Oral, Q breakfast, Dew, Jason S, MD, 500 mg of elemental calcium at 03/04/21 0834   Chlorhexidine Gluconate Cloth 2 % PADS 6 each, 6 each, Topical, Q0600, Colon Flattery, NP, 6 each at 03/04/21 0505   cloNIDine (CATAPRES) tablet 0.2 mg,  0.2 mg, Oral, BID, Dew, Erskine Squibb, MD, 0.2 mg at 03/03/21 2138   epoetin alfa (EPOGEN) injection 10,000 Units, 10,000 Units, Intravenous, Q M,W,F-HD, Breeze, Benancio Deeds, NP, 10,000 Units at 33/82/50 5397   folic acid (FOLVITE) tablet 1 mg, 1,000 mcg, Oral, Daily, Dew, Erskine Squibb, MD, 1 mg at 03/04/21 0834   HYDROmorphone (DILAUDID) injection 0.5 mg, 0.5 mg, Intravenous, Q3H PRN, Jennye Boroughs, MD   insulin aspart (novoLOG) injection 0-9 Units, 0-9 Units, Subcutaneous, TID AC & HS, Dew, Erskine Squibb, MD, 2 Units at 03/03/21 2138   levothyroxine (SYNTHROID) tablet 112 mcg, 112 mcg, Oral, QAC breakfast, Algernon Huxley, MD, 112 mcg at 03/04/21 6734   magnesium hydroxide (MILK OF MAGNESIA) suspension 30 mL, 30 mL, Oral, Daily PRN, Algernon Huxley, MD   melatonin tablet 10 mg, 10 mg,  Oral, QHS PRN, Algernon Huxley, MD, 10 mg at 03/03/21 2338   metoprolol tartrate (LOPRESSOR) tablet 100 mg, 100 mg, Oral, BID, Dew, Erskine Squibb, MD, 100 mg at 03/03/21 2138   ondansetron (ZOFRAN) tablet 4 mg, 4 mg, Oral, Q6H PRN **OR** ondansetron (ZOFRAN) injection 4 mg, 4 mg, Intravenous, Q6H PRN, Lucky Cowboy, Erskine Squibb, MD   traZODone (DESYREL) tablet 25 mg, 25 mg, Oral, QHS PRN, Algernon Huxley, MD, 25 mg at 03/01/21 2248   Vitamin D (Ergocalciferol) (DRISDOL) capsule 50,000 Units, 50,000 Units, Oral, Q Fri, Dew, Jason S, MD, 50,000 Units at 03/04/21 8916  Patients Current Diet:  Diet Order             Diet renal/carb modified with fluid restriction Diet-HS Snack? Nothing; Fluid restriction: 1200 mL Fluid; Room service appropriate? Yes; Fluid consistency: Thin  Diet effective now                   Precautions / Restrictions Precautions Precautions: Fall Restrictions Weight Bearing Restrictions: No RLE Weight Bearing: Partial weight bearing LLE Weight Bearing: Partial weight bearing   Has the patient had 2 or more falls or a fall with injury in the past year? No  Prior Activity Level Community (5-7x/wk): independent prior to admit, no  DME used, driving, working  Prior Functional Level Self Care: Did the patient need help bathing, dressing, using the toilet or eating? Independent  Indoor Mobility: Did the patient need assistance with walking from room to room (with or without device)? Independent  Stairs: Did the patient need assistance with internal or external stairs (with or without device)? Independent  Functional Cognition: Did the patient need help planning regular tasks such as shopping or remembering to take medications? Independent  Patient Information Are you of Hispanic, Latino/a,or Spanish origin?: A. No, not of Hispanic, Latino/a, or Spanish origin What is your race?: A. White Do you need or want an interpreter to communicate with a doctor or health care staff?: 0. No  Patient's Response To:  Health Literacy and Transportation Is the patient able to respond to health literacy and transportation needs?: Yes Health Literacy - How often do you need to have someone help you when you read instructions, pamphlets, or other written material from your doctor or pharmacy?: Never In the past 12 months, has lack of transportation kept you from medical appointments or from getting medications?: No In the past 12 months, has lack of transportation kept you from meetings, work, or from getting things needed for daily living?: No  Development worker, international aid / [place] Devices/Equipment: CBG Meter, Oxygen Home Equipment: Cane - single point, Rollator (4 wheels), Conservation officer, nature (2 wheels), BSC/3in1, Wheelchair - manual, Shower seat  Prior Device Use: Indicate devices/aids used by the patient prior to current illness, exacerbation or injury? None of the above  Current Functional Level Cognition  Overall Cognitive Status: Within Functional Limits for tasks assessed Orientation Level: Oriented X4 General Comments: very motivated to participate, however frustrated with pain levels    Extremity  Assessment (includes Sensation/Coordination)  Upper Extremity Assessment: Generalized weakness  Lower Extremity Assessment: Generalized weakness    ADLs  Overall ADL's : Needs assistance/impaired Toilet Transfer: Minimal assistance, BSC/3in1, Rolling walker (2 wheels) Toilet Transfer Details (indicate cue type and reason): simulated Toileting- Clothing Manipulation and Hygiene: Moderate assistance, Sit to/from stand Functional mobility during ADLs: Minimal assistance, Rolling walker (2 wheels)    Mobility  General bed mobility comments: not performed as pt received in  chair    Transfers  Overall transfer level: Needs assistance Equipment used: Rolling walker (2 wheels) Transfers: Sit to/from Stand Sit to Stand: Supervision General transfer comment: safe technique. Still requires heavy B UE assistance in order to stand. Once standing, upright posture noted    Ambulation / Gait / Stairs / Wheelchair Mobility  Ambulation/Gait Ambulation/Gait assistance: Supervision Gait Distance (Feet): 55 Feet Assistive device: Rolling walker (2 wheels) Gait Pattern/deviations: Step-through pattern General Gait Details: ambulated with step to gait pattern. 1 standing rest break required. Heavy WBing through B UE due to R foot pain. Pt on 2L of O2 with exertion decreasing to 86% with ambulation. Gait velocity: decreased    Posture / Balance Balance Overall balance assessment: Mild deficits observed, not formally tested Sitting-balance support: Feet supported Sitting balance-Leahy Scale: Good Standing balance support: During functional activity, Reliant on assistive device for balance Standing balance-Leahy Scale: Fair Standing balance comment: increased lateral weight shift during ambulation due to body habitus    Special needs/care consideration CPAP, Dialysis: Hemodialysis Monday, Wednesday, and Friday, Oxygen 4L in hospital, CPAP at night, and Diabetic management yes   Previous Home  Environment (from acute therapy documentation) Living Arrangements: Alone Available Help at Discharge: Family, Friend(s), Available PRN/intermittently Type of Home: House Home Layout: One level Home Access: Stairs to enter Entrance Stairs-Rails: Right, Left Entrance Stairs-Number of Steps: 2 in front and 4 in back Bathroom Shower/Tub: Multimedia programmer: Handicapped height Bathroom Accessibility: Yes [place]: No Additional Comments: Pt currently lives in her mother's old home that is 1 level with level entry, but planning to move to a new house as soon as possible that's 1 level with 4 steps to enter with B rails. She does not want to return to mother's home as she believe it has mold.  Discharge Living Setting Plans for Discharge Living Setting: Patient's home Type of Home at Discharge: House Discharge Home Layout: One level Discharge Home Access: Stairs to enter Entrance Stairs-Rails: Right, Left Entrance Stairs-Number of Steps: 2-4 Discharge Bathroom Shower/Tub: Walk-in shower Discharge Bathroom Toilet: Handicapped height Discharge Bathroom Accessibility: Yes How Accessible: Accessible via walker Does the patient have any problems obtaining your medications?: No  Social/Family/Support Systems Anticipated Caregiver: contact is Beckey Rutter (cousin) Anticipated Caregiver's Contact Information: [phone] Ability/Limitations of Caregiver: see additional info Caregiver Availability: Intermittent Discharge Plan Discussed with Primary Caregiver: Yes Is Caregiver In Agreement with Plan?: Yes Does Caregiver/Family have Issues with Lodging/Transportation while Pt is in Rehab?: No  Goals Patient/Family Goal for Rehab: PT/OT mod I, SLP n/a Expected length of stay: 10-14 days Additional Information: Discussed typical recommendation for 24/7 on initial discharge with pt/family.  Shared that it was possible that she could reach mod I level, but if not would need to  start making arrangements for 24/7 between family, friends, and potentially hired caregivers. Pt/Family Agrees to Admission and willing to participate: Yes Program Orientation Provided & Reviewed with Pt/Caregiver Including Roles  & Responsibilities: Yes  Barriers to Discharge: Decreased caregiver support, Inaccessible home environment  Decrease burden of Care through IP rehab admission: n/a  Possible need for SNF placement upon discharge: Not anticipated  Patient Condition: I have reviewed medical records from [facility], spoken with CM, and patient and family member. I discussed via phone for inpatient rehabilitation assessment.  Patient will benefit from ongoing PT and OT, can actively participate in 3 hours of therapy a day 5 days of the week, and can make measurable gains during the admission.  Patient  will also benefit from the coordinated team approach during an Inpatient Acute Rehabilitation admission.  The patient will receive intensive therapy as well as Rehabilitation physician, nursing, social worker, and care management interventions.  Due to safety, disease management, medication administration, pain management, and patient education the patient requires 24 hour a day rehabilitation nursing.  The patient is currently min assist to min guard with mobility and basic ADLs.  Discharge setting and therapy post discharge at home with home health is anticipated.  Patient has agreed to participate in the Acute Inpatient Rehabilitation Program and will admit today.  Preadmission Screen Completed By:  Michel Santee, PT, DPT 03/04/2021 10:48 AM ______________________________________________________________________   Discussed status with Dr. Ranell Patrick on 03/04/21  at 10:48 AM  and received approval for admission today.  Admission Coordinator:  Michel Santee, PT, DPT time 10:48 AM Sudie Grumbling 03/04/21    Assessment/Plan: Diagnosis: Debility Does the need for close, 24 hr/day Medical supervision in  concert with the patient's rehab needs make it unreasonable for this patient to be served in a less intensive setting? Yes Co-Morbidities requiring supervision/potential complications: morbid obesity, right food pain, acute on chronic renal failure, cardiorenal syndrome, OSA Due to bladder management, bowel management, safety, skin/wound care, disease management, medication administration, pain management, and patient education, does the patient require 24 hr/day rehab nursing? Yes Does the patient require coordinated care of a physician, rehab nurse, PT, OT to address physical and functional deficits in the context of the above medical diagnosis(es)? Yes Addressing deficits in the following areas: balance, endurance, locomotion, strength, transferring, bowel/bladder control, bathing, dressing, feeding, grooming, toileting, and psychosocial support Can the patient actively participate in an intensive therapy program of at least 3 hrs of therapy 5 days a week? Yes The potential for patient to make measurable gains while on inpatient rehab is excellent Anticipated functional outcomes upon discharge from inpatient rehab: modified independent PT, modified independent OT, independent SLP Estimated rehab length of stay to reach the above functional goals is: 10-14 days Anticipated discharge destination: Home 10. Overall Rehab/Functional Prognosis: excellent   MD Signature: Leeroy Cha, MD

## 2021-03-02 NOTE — Progress Notes (Signed)
[facility]  ROUNDING NOTE   Subjective:   Patient seen and evaluated during dialysis   HEMODIALYSIS FLOWSHEET:  Blood Flow Rate (mL/min): 400 mL/min Arterial Pressure (mmHg): -160 mmHg Venous Pressure (mmHg): 140 mmHg Transmembrane Pressure (mmHg): 40 mmHg Ultrafiltration Rate (mL/min): 500 mL/min Dialysate Flow Rate (mL/min): 500 ml/min Conductivity: Machine : 14 Conductivity: Machine : 14 Dialysis Fluid Bolus: Normal Saline Bolus Amount (mL): 200 mL  No complaints at this time   Objective:  Vital signs in last 24 hours:  Temp:  [97.6 F (36.4 C)-98.7 F (37.1 C)] 97.7 F (36.5 C) (01/18 1024) Pulse Rate:  [71-87] 84 (01/18 1145) Resp:  [16-24] 19 (01/18 1130) BP: (116-161)/(54-84) 145/84 (01/18 1145) SpO2:  [96 %-100 %] 99 % (01/18 1145) Weight:  [164.3 kg] 164.3 kg (01/18 1024)  Weight change:  Filed Weights   03/01/21 0500 03/02/21 1024  Weight: (!) 162.6 kg (!) 164.3 kg     Intake/Output: I/O last 3 completed shifts: In: 240 [P.O.:240] Out: 150 [Urine:150]   Intake/Output this shift:  No intake/output data recorded.  Physical Exam: General: NAD, Sitting in chair  Head: Normocephalic, atraumatic. Moist oral mucosal membranes  Eyes: Anicteric  Lungs:  Diminished bilaterally, 3.5L [place] O2  Heart: Irregular   Abdomen:  Soft, nontender, obese  Extremities:  1+ peripheral edema.  Neurologic: Nonfocal, moving all four extremities  Access:  RIJ permcath 0/99/83    Basic Metabolic Panel: Recent Labs  Lab 02/24/21 0830 02/25/21 0737 02/26/21 0519 02/28/21 0549 03/01/21 0719  NA 145 139 138 136 135  K 5.1 6.1* 5.1 4.2 4.4  CL 108 106 101 101 98  CO2 24 23 25 27 24   GLUCOSE 174* 139* 134* 177* 129*  BUN 154* 159* 122* 103* 62*  CREATININE 6.00* 6.77* 6.03* 6.13* 4.39*  CALCIUM 9.2 8.8* 8.7* 8.4* 8.3*  PHOS  --   --   --   --  4.4     Liver Function Tests: Recent Labs  Lab 03/01/21 0719  ALBUMIN 2.7*    No results for  input(s): LIPASE, AMYLASE in the last 168 hours. No results for input(s): AMMONIA in the last 168 hours.  CBC: Recent Labs  Lab 02/24/21 0830 02/25/21 0737 02/26/21 0519 02/28/21 0549 03/01/21 0719  WBC 15.7* 15.2* 12.2* 12.0* 12.4*  NEUTROABS  --  11.8* 9.3*  --  9.2*  HGB 9.3* 8.3* 8.0* 7.9* 9.3*  HCT 31.2* 28.9* 27.5* 26.2* 30.6*  MCV 93.4 96.7 94.2 90.7 89.5  PLT 254 273 244 265 227     Cardiac Enzymes: No results for input(s): CKTOTAL, CKMB, CKMBINDEX, TROPONINI in the last 168 hours.  BNP: Invalid input(s): POCBNP  CBG: Recent Labs  Lab 03/01/21 0737 03/01/21 1229 03/01/21 1752 03/01/21 2041 03/02/21 0812  GLUCAP 143* 134* 138* 161* 101*     Microbiology: Results for orders placed or performed during the hospital encounter of 02/23/21  Resp Panel by RT-PCR (Flu A&B, Covid) Nasopharyngeal Swab     Status: None   Collection Time: 02/23/21  9:29 PM   Specimen: Nasopharyngeal Swab; Nasopharyngeal(NP) swabs in vial transport medium  Result Value Ref Range Status   SARS Coronavirus 2 by RT PCR NEGATIVE NEGATIVE Final    Comment: (NOTE) SARS-CoV-2 target nucleic acids are NOT DETECTED.  The SARS-CoV-2 RNA is generally detectable in upper respiratory specimens during the acute phase of infection. The lowest concentration of SARS-CoV-2 viral copies this assay can detect is 138 copies/mL. A negative result does not preclude  SARS-Cov-2 infection and should not be used as the sole basis for treatment or other patient management decisions. A negative result may occur with  improper specimen collection/handling, submission of specimen other than nasopharyngeal swab, presence of viral mutation(s) within the areas targeted by this assay, and inadequate number of viral copies(<138 copies/mL). A negative result must be combined with clinical observations, patient history, and epidemiological information. The expected result is Negative.  Fact Sheet for Patients:   EntrepreneurPulse.com.au  Fact Sheet for Healthcare Providers:  IncredibleEmployment.be  This test is no t yet approved or cleared by the [place] FDA and  has been authorized for detection and/or diagnosis of SARS-CoV-2 by FDA under an Emergency Use Authorization (EUA). This EUA will remain  in effect (meaning this test can be used) for the duration of the COVID-19 declaration under Section 564(b)(1) of the Act, 21 U.S.C.section 360bbb-3(b)(1), unless the authorization is terminated  or revoked sooner.       Influenza A by PCR NEGATIVE NEGATIVE Final   Influenza B by PCR NEGATIVE NEGATIVE Final    Comment: (NOTE) The Xpert Xpress SARS-CoV-2/FLU/RSV plus assay is intended as an aid in the diagnosis of influenza from Nasopharyngeal swab specimens and should not be used as a sole basis for treatment. Nasal washings and aspirates are unacceptable for Xpert Xpress SARS-CoV-2/FLU/RSV testing.  Fact Sheet for Patients: EntrepreneurPulse.com.au  Fact Sheet for Healthcare Providers: IncredibleEmployment.be  This test is not yet approved or cleared by the [place] FDA and has been authorized for detection and/or diagnosis of SARS-CoV-2 by FDA under an Emergency Use Authorization (EUA). This EUA will remain in effect (meaning this test can be used) for the duration of the COVID-19 declaration under Section 564(b)(1) of the Act, 21 U.S.C. section 360bbb-3(b)(1), unless the authorization is terminated or revoked.  Performed at [facility], [address] [place], [place] [address]     Coagulation Studies: No results for input(s): LABPROT, INR in the last 72 hours.   Urinalysis: No results for input(s): COLORURINE, LABSPEC, PHURINE, GLUCOSEU, HGBUR, BILIRUBINUR, KETONESUR, PROTEINUR, UROBILINOGEN, NITRITE, LEUKOCYTESUR in the last 72 hours.  Invalid input(s): APPERANCEUR       Imaging: No results found.   Medications:    sodium chloride     sodium chloride       amLODipine  5 mg Oral BID   apixaban  5 mg Oral BID   atorvastatin  20 mg Oral QHS   calcitRIOL  0.25 mcg Oral Daily   calcium carbonate  500 mg of elemental calcium Oral Q breakfast   Chlorhexidine Gluconate Cloth  6 each Topical Q0600   cloNIDine  0.2 mg Oral BID   epoetin (EPOGEN/PROCRIT) injection  10,000 Units Intravenous Q M,W,F-HD   folic acid  0,932 mcg Oral Daily   heparin sodium (porcine)       insulin aspart  0-9 Units Subcutaneous TID AC & HS   levothyroxine  112 mcg Oral QAC breakfast   metoprolol tartrate  100 mg Oral BID   Vitamin D (Ergocalciferol)  50,000 Units Oral Q Fri   sodium chloride, sodium chloride, acetaminophen **OR** acetaminophen, albuterol, alteplase, heparin, lidocaine (PF), lidocaine-prilocaine, magnesium hydroxide, melatonin, morphine injection, ondansetron **OR** ondansetron (ZOFRAN) IV, pentafluoroprop-tetrafluoroeth, traZODone  Assessment/ Plan:  Nicole Schmidt is a 56 y.o. white female hypertension, hyperlipidemia, diabetes mellitus type II, diastolic congestive heart failure, hypothyroidism, history of breast cancer, atrial fibrillation and pulmonary embolism who was admitted to [facility] on 02/23/2021 for Cardiorenal syndrome with  renal failure [I13.10] Acute on chronic renal failure (HCC) [N17.9, N18.9] AKI (acute kidney injury) ([place]) [N17.9]  Acute Kidney Injury on chronic kidney disease stage IV: baseline creatinine of 2.43 on 01/10/21. 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 Requiring hemodialysis. Dialysis initiated on 02/25/21.   -Currently receiving dialysis seated in chair, tolerating well.  -Appreciate dialysis coordinator arranging outpatient treatment at [facility] on MWF schedule.  Patient [facility]. Will monitor and make adjustments to outpatient clinic as needed.   Anemia of  chronic kidney disease: No ESA administered on this admission. Iron levels consistent with iron deficiency.   Hypertension with chronic kidney disease: 124/69 during dialysis. On amlodipine, clonidine and metoprolol.   Hematuria: Ureteral stent placed by urology on 02/14/2021.  With recent episode of nephrolithiasis.    Secondary Hyperparathyroidism: PTH on 12/30/20 was 72. Currently without binders. - Continue calcitriol and ergocalciferol.   Acute respiratory failure: increased oxygen requirements from her baseline of 2 liters. Secondary to pneumonia and pulmonary edema.  Remains on 3.5 L [place]   LOS: 6 Jermisha Hoffart 1/18/202311:58 AM

## 2021-03-02 NOTE — Progress Notes (Signed)
Physical Therapy Treatment Patient Details Name: Nicole Schmidt MRN: [id number] DOB: 1965/09/30 Today's Date: 03/02/2021   History of Present Illness Pt is a 56 y/o F admitted on 02/23/21 after presenting to the ER with worsening LE edema & increased dyspnea. Pt has a permacath placed & started dialysis on 02/25/21. Pt is being treated for AKI superimposed on stage IV CKD, acute on chronic diastolic CHF with volume overload. Pt with recent admission 02/01/21-02/22/21 for management of acute on chronic diastolic CHF & AKI superimposed on CKD stage 3. PMH: DM2, HTN, dyslipidemia, hypothyroidism, gout, HFpEF, a-fib & PE on eliquis, breast CA, obesity    PT Comments    Several attempts to work with patient this date due to HD. Pt is making good progress towards goals and trialed additional AD this session as BRW allows easier access for off loading B LE. Pt still continues to be limited by B foot pain, educated to time sessions with pain meds if required. Seated there-ex performed in recliner after short bout of ambulation (pain limited). Pt eager to go to CIR.   Recommendations for follow up therapy are one component of a multi-disciplinary discharge planning process, led by the attending physician.  Recommendations may be updated based on patient status, additional functional criteria and insurance authorization.  Follow Up Recommendations  Acute inpatient rehab (3hours/day)     Assistance Recommended at Discharge Intermittent Supervision/Assistance  Patient can return home with the following Help with stairs or ramp for entrance;Assist for transportation;Assistance with cooking/housework;A little help with walking and/or transfers;A little help with bathing/dressing/bathroom   Equipment Recommendations  None recommended by PT    Recommendations for Other Services       Precautions / Restrictions Precautions Precautions: Fall Restrictions Weight Bearing Restrictions: No     Mobility  Bed  Mobility               General bed mobility comments: not performed as pt received in chair    Transfers Overall transfer level: Needs assistance Equipment used:  (BRW) Transfers: Sit to/from Stand Sit to Stand: Supervision           General transfer comment: trialed using BRW and adjusted to height. Once standing, upright posture noted    Ambulation/Gait Ambulation/Gait assistance: Supervision Gait Distance (Feet): 50 Feet Assistive device:  (BRW) Gait Pattern/deviations: Step-through pattern       General Gait Details: short step to gait pattern, however pt with increased WBing through B arms due to pain in B feet. O2 on 3L with exertion with sats at 90% with exertion. Slightly improved comfort with wider walker   Stairs             Wheelchair Mobility    Modified Rankin (Stroke Patients Only)       Balance Overall balance assessment: Mild deficits observed, not formally tested                                          Cognition Arousal/Alertness: Awake/alert Behavior During Therapy: WFL for tasks assessed/performed Overall Cognitive Status: Within Functional Limits for tasks assessed                                 General Comments: motivated to participate, however appears fatigued from events of the day including HD  Exercises Other Exercises Other Exercises: B LE seated ther-ex performed on B LE with LAQ, AP, heel raises, and hip abd/add. 10 reps performed. Also performed including seated forward lean with resistance.    General Comments        Pertinent Vitals/Pain Pain Assessment Pain Assessment: 0-10 Pain Score: 8  Pain Location: B feet Pain Descriptors / Indicators: Discomfort, Pressure Pain Intervention(s): Limited activity within patient's tolerance, Repositioned    Home Living                          Prior Function            PT Goals (current goals can now be found in  the care plan section) Acute Rehab PT Goals Patient Stated Goal: get better, return to PLOF PT Goal Formulation: With patient Time For Goal Achievement: 03/13/21 Potential to Achieve Goals: Good Progress towards PT goals: Progressing toward goals    Frequency    Min 2X/week      PT Plan Current plan remains appropriate    Co-evaluation              AM-PAC PT "6 Clicks" Mobility   Outcome Measure  Help needed turning from your back to your side while in a flat bed without using bedrails?: None Help needed moving from lying on your back to sitting on the side of a flat bed without using bedrails?: A Little Help needed moving to and from a bed to a chair (including a wheelchair)?: A Little Help needed standing up from a chair using your arms (e.g., wheelchair or bedside chair)?: A Little Help needed to walk in hospital room?: A Lot Help needed climbing 3-5 steps with a railing? : A Lot 6 Click Score: 17    End of Session Equipment Utilized During Treatment: Oxygen Activity Tolerance: Patient tolerated treatment well Patient left: in chair;with call bell/phone within reach Nurse Communication: Mobility status PT Visit Diagnosis: Unsteadiness on feet (R26.81);Muscle weakness (generalized) (M62.81);Difficulty in walking, not elsewhere classified (R26.2)     Time: 3557-3220 PT Time Calculation (min) (ACUTE ONLY): 18 min  Charges:  $Therapeutic Exercise: 8-22 mins                     Greggory Stallion, PT, DPT, GCS [phone]    Antonea Gaut 03/02/2021, 3:51 PM

## 2021-03-02 NOTE — Progress Notes (Signed)
Patient completed scheduled 3-hour HD session. RIJ CVC maintained prescribed BFR, without difficulty. Targeted UF met, with 1-liter fluid removal. Patient was able to transfer from recliner to scale for weight, standby assist one person. Prescribed dialysis medication given.   Report given to primary nurse, patient returned to assigned room.

## 2021-03-02 NOTE — Progress Notes (Signed)
Occupational Therapy Treatment Patient Details Name: Nicole Schmidt MRN: [id number] DOB: 09-26-1965 Today's Date: 03/02/2021   History of present illness Pt is a 56 y/o F admitted on 02/23/21 after presenting to the ER with worsening LE edema & increased dyspnea. Pt has a permacath placed & started dialysis on 02/25/21. Pt is being treated for AKI superimposed on stage IV CKD, acute on chronic diastolic CHF with volume overload. Pt with recent admission 02/01/21-02/22/21 for management of acute on chronic diastolic CHF & AKI superimposed on CKD stage 3. PMH: DM2, HTN, dyslipidemia, hypothyroidism, gout, HFpEF, a-fib & PE on eliquis, breast CA, obesity   OT comments  Pt having just returned to room from dialysis as therapist enters the room. Pt requesting assistance for toileting. Standing from recliner chair with supervision. Pt needs assistance to manage O2 line and tolerates ambulation ~ 8' to [facility] with RW and min A for balance. Pt reporting pain in B feet during session which is limiting this session. Pt able to void but needing assistance with hygiene and clothing management. Min A for standing balance during hygiene. Pt ambulates short distance to return to recliner chair. She remains on 3Ls throughout session. All needs within reach. Pt continues to be motivated but is fatigued for dialysis. OT continues to recommend acute inpatient rehab for pt to return to prior independent level.    Recommendations for follow up therapy are one component of a multi-disciplinary discharge planning process, led by the attending physician.  Recommendations may be updated based on patient status, additional functional criteria and insurance authorization.    Follow Up Recommendations  Acute inpatient rehab (3hours/day)    Assistance Recommended at Discharge Frequent or constant Supervision/Assistance  Patient can return home with the following  Help with stairs or ramp for entrance;Assistance with  cooking/housework;A little help with walking and/or transfers;A little help with bathing/dressing/bathroom;Assist for transportation   Equipment Recommendations  Other (comment)       Precautions / Restrictions Precautions Precautions: Fall Restrictions Weight Bearing Restrictions: No       Mobility Bed Mobility               General bed mobility comments: not performed as pt received in chair    Transfers Overall transfer level: Needs assistance   Transfers: Sit to/from Stand Sit to Stand: Supervision                 Balance Overall balance assessment: Mild deficits observed, not formally tested Sitting-balance support: Feet supported Sitting balance-Leahy Scale: Good     Standing balance support: During functional activity, Reliant on assistive device for balance Standing balance-Leahy Scale: Fair                             ADL either performed or assessed with clinical judgement   ADL Overall ADL's : Needs assistance/impaired                         Toilet Transfer: Minimal assistance;BSC/3in1;Rolling walker (2 wheels)   Toileting- Clothing Manipulation and Hygiene: Moderate assistance;Sit to/from stand              Extremity/Trunk Assessment Upper Extremity Assessment Upper Extremity Assessment: Generalized weakness   Lower Extremity Assessment Lower Extremity Assessment: Generalized weakness        Vision Patient Visual Report: No change from baseline            Cognition  Arousal/Alertness: Awake/alert Behavior During Therapy: WFL for tasks assessed/performed Overall Cognitive Status: Within Functional Limits for tasks assessed                                 General Comments: motivated to participate, however appears fatigued from events of the day including HD                   Pertinent Vitals/ Pain       Pain Assessment Pain Assessment: 0-10 Pain Score: 8  Pain Location: B  feet Pain Descriptors / Indicators: Discomfort, Aching Pain Intervention(s): Limited activity within patient's tolerance, Premedicated before session, Repositioned         Frequency  Min 2X/week        Progress Toward Goals  OT Goals(current goals can now be found in the care plan section)  Progress towards OT goals: Progressing toward goals  Acute Rehab OT Goals Patient Stated Goal: to go to rehab and be independent OT Goal Formulation: With patient Time For Goal Achievement: 03/14/21 Potential to Achieve Goals: Good  Plan Discharge plan remains appropriate;Frequency remains appropriate       AM-PAC OT "6 Clicks" Daily Activity     Outcome Measure   Help from another person eating meals?: None Help from another person taking care of personal grooming?: A Little Help from another person toileting, which includes using toliet, bedpan, or urinal?: A Lot Help from another person bathing (including washing, rinsing, drying)?: A Lot Help from another person to put on and taking off regular upper body clothing?: A Little Help from another person to put on and taking off regular lower body clothing?: A Lot 6 Click Score: 16    End of Session Equipment Utilized During Treatment: Oxygen (3Ls)  OT Visit Diagnosis: Unsteadiness on feet (R26.81);Muscle weakness (generalized) (M62.81)   Activity Tolerance Patient tolerated treatment well   Patient Left in chair;with call bell/phone within reach   Nurse Communication Mobility status        Time: 5361-4431 OT Time Calculation (min): 18 min  Charges: OT General Charges $OT Visit: 1 Visit OT Treatments $Self Care/Home Management : 8-22 mins  Darleen Crocker, MS, OTR/L , CBIS ascom [phone]  03/02/21, 4:09 PM

## 2021-03-02 NOTE — TOC Progression Note (Signed)
Transition of Care [facility]) - Progression Note    Patient Details  Name: Nicole Schmidt MRN: [id number] Date of Birth: June 02, 1965  Transition of Care [facility]) CM/SW [place], [place] Phone Number: 03/02/2021, 8:46 AM  Clinical Narrative:     CSW notes insurance Josem Kaufmann has been started for patient to go to CIR, TOC will continue to follow for any discharge needs.    Expected Discharge Plan:  (TBD) Barriers to Discharge: Continued Medical Work up  Expected Discharge Plan and Services Expected Discharge Plan:  (TBD)       Living arrangements for the past 2 months: Single Family Home                                       Social Determinants of Health (SDOH) Interventions    Readmission Risk Interventions Readmission Risk Prevention Plan 02/11/2020 10/19/2018  Transportation Screening Complete Complete  PCP or Specialist Appt within 3-5 Days Not Complete -  Not Complete comments To be scheduled when discharged. -  [place] or Home Care Consult Complete -  Social Work Consult for [place] Planning/Counseling Complete Patient refused  Palliative Care Screening Complete Not Applicable  Medication Review Press photographer) Complete Complete  Some recent data might be hidden

## 2021-03-02 NOTE — Progress Notes (Signed)
Inpatient Rehab Admissions Coordinator:   Awaiting determination from insurance regarding CIR prior auth.   Shann Medal, PT, DPT Admissions Coordinator [phone] 03/02/21  11:34 AM

## 2021-03-02 NOTE — Progress Notes (Signed)
OT Cancellation Note  Patient Details Name: Nicole Schmidt MRN: [id number] DOB: [birth year]   Cancelled Treatment:    Reason Eval/Treat Not Completed: Patient at procedure or test/ unavailable. Pt off unit for dialysis treatment. OT will re-attempt when next available.   Darleen Crocker, MS, OTR/L , CBIS ascom [phone]  03/02/21, 1:41 PM

## 2021-03-02 NOTE — Progress Notes (Addendum)
Progress Note    Nicole Schmidt  DEY:[id number] DOB: 1965-08-29  DOA: 02/23/2021 PCP: Lenard Simmer, MD      Brief Narrative:    Medical records reviewed and are as summarized below:  Nicole Schmidt is a 56 y.o. female with medical history significant for type II DM, hypertension, dyslipidemia, hypothyroidism, gout, chronic diastolic CHF, chronic hypoxemic respiratory failure on 2 L oxygen, atrial fibrillation and PE on Eliquis, recent hospitalization from 02/01/2021 through 02/22/2021 for CHF exacerbation and AKI.  She presented to the hospital because of worsening lower extremity edema and shortness of breath.  She was admitted for acute on chronic CHF, acute on chronic hypoxemic respiratory failure and progression of CKD stage IV to ESRD.  Permacath was placed on 02/25/2021 and hemodialysis was initiated for ESRD and fluid overload.    Assessment/Plan:   Principal Problem:   Acute on chronic renal failure (HCC) Active Problems:   Acute renal failure superimposed on stage 4 chronic kidney disease (HCC)   Acute on chronic diastolic CHF (congestive heart failure) (HCC)   Obstructive sleep apnea   Morbid obesity with BMI of 60.0-69.9, adult (HCC)   Diabetes mellitus type 2 in obese ([place])   Acute on chronic respiratory failure with hypoxemia (HCC)   AF (paroxysmal atrial fibrillation) (HCC)   Chronic respiratory failure with hypoxia (HCC)   ESRD (end stage renal disease) ([place])    Body mass index is 62.17 kg/m.  (Morbid obesity)   AKI on CKD stage IV with progression to ESRD, acute on chronic diastolic CHF, anasarca: Hemodialysis was initiated on 02/25/2021.  Follow-up with nephrologist for hemodialysis.  Patient will continue with hemodialysis as an outpatient on Mondays, Wednesdays and Fridays.  Acute on chronic hypoxemic respiratory failure: She uses 2 L/min oxygen at home.  She is currently on 3 L/min oxygen.  OSA: Continue CPAP at night  Generalized weakness: PT  recommended discharge to inpatient rehab for further rehabilitation  Paroxysmal atrial fibrillation, history of pulmonary embolism: Continue Eliquis  History of hematuria left ureteral stone with recent left uteroscopy with laser lithotripsy and stent exchange ureteral stent  Other comorbidities include hypertension, hyperlipidemia     Diet Order             Diet renal/carb modified with fluid restriction Diet-HS Snack? Nothing; Fluid restriction: 1200 mL Fluid; Room service appropriate? Yes; Fluid consistency: Thin  Diet effective now                      Consultants: Nephrologist  Procedures: Right IJ cuff tunneled hemodialysis catheter    Medications:    amLODipine  5 mg Oral BID   apixaban  5 mg Oral BID   atorvastatin  20 mg Oral QHS   calcitRIOL  0.25 mcg Oral Daily   calcium carbonate  500 mg of elemental calcium Oral Q breakfast   Chlorhexidine Gluconate Cloth  6 each Topical Q0600   cloNIDine  0.2 mg Oral BID   epoetin (EPOGEN/PROCRIT) injection  10,000 Units Intravenous Q M,W,F-HD   folic acid  3,149 mcg Oral Daily   insulin aspart  0-9 Units Subcutaneous TID AC & HS   levothyroxine  112 mcg Oral QAC breakfast   metoprolol tartrate  100 mg Oral BID   Vitamin D (Ergocalciferol)  50,000 Units Oral Q Fri   Continuous Infusions:   Anti-infectives (From admission, onward)    Start     Dose/Rate Route Frequency Ordered Stop  02/24/21 1645  ceFAZolin (ANCEF) IVPB 1 g/50 mL premix        over 30 Minutes  Continuous PRN 02/24/21 1659 02/24/21 1645   02/24/21 1627  ceFAZolin (ANCEF) IVPB 1 g/50 mL premix  Status:  Discontinued        1 g 100 mL/hr over 30 Minutes Intravenous 30 min pre-op 02/24/21 1627 02/24/21 1816   02/24/21 1442  ceFAZolin (ANCEF) IVPB 2g/100 mL premix  Status:  Discontinued        2 g 200 mL/hr over 30 Minutes Intravenous 30 min pre-op 02/24/21 1442 02/24/21 1627              Family Communication/Anticipated D/C date and  plan/Code Status   DVT prophylaxis:  apixaban (ELIQUIS) tablet 5 mg     Code Status: Full Code  Family Communication: None Disposition Plan: Plan to discharge to inpatient rehab   Status is: Inpatient  Remains inpatient appropriate because: Awaiting placement to inpatient rehab           Subjective:   Interval events noted.  She complains of generalized weakness, swelling in bilateral legs associated with soreness.  Objective:    Vitals:   03/02/21 1330 03/02/21 1345 03/02/21 1400 03/02/21 1543  BP: 134/62 (!) 166/70 (!) 152/71 135/68  Pulse: 91 89 93 99  Resp:    18  Temp:   98.8 F (37.1 C) 98.1 F (36.7 C)  TempSrc:   Oral Oral  SpO2: 100% 100% 100% 96%  Weight:      Height:       No data found.   Intake/Output Summary (Last 24 hours) at 03/02/2021 1730 Last data filed at 03/01/2021 2230 Gross per 24 hour  Intake --  Output 100 ml  Net -100 ml   Filed Weights   03/01/21 0500 03/02/21 1024  Weight: (!) 162.6 kg (!) 164.3 kg    Exam:  GEN: NAD SKIN: Warm and dry EYES: EOMI ENT: MMM CV: RRR PULM: CTA B ABD: soft, obese/distended, NT, +BS CNS: AAO x 3, non focal EXT: Bilateral leg edema (2+), no tenderness or erythema        Data Reviewed:   I have personally reviewed following labs and imaging studies:  Labs: Labs show the following:   Basic Metabolic Panel: Recent Labs  Lab 02/24/21 0830 02/25/21 0737 02/26/21 0519 02/28/21 0549 03/01/21 0719  NA 145 139 138 136 135  K 5.1 6.1* 5.1 4.2 4.4  CL 108 106 101 101 98  CO2 24 23 25 27 24   GLUCOSE 174* 139* 134* 177* 129*  BUN 154* 159* 122* 103* 62*  CREATININE 6.00* 6.77* 6.03* 6.13* 4.39*  CALCIUM 9.2 8.8* 8.7* 8.4* 8.3*  PHOS  --   --   --   --  4.4   GFR Estimated Creatinine Clearance: 22 mL/min (A) (by C-G formula based on SCr of 4.39 mg/dL (H)). Liver Function Tests: Recent Labs  Lab 03/01/21 0719  ALBUMIN 2.7*   No results for input(s): LIPASE, AMYLASE in the  last 168 hours. No results for input(s): AMMONIA in the last 168 hours. Coagulation profile No results for input(s): INR, PROTIME in the last 168 hours.  CBC: Recent Labs  Lab 02/24/21 0830 02/25/21 0737 02/26/21 0519 02/28/21 0549 03/01/21 0719  WBC 15.7* 15.2* 12.2* 12.0* 12.4*  NEUTROABS  --  11.8* 9.3*  --  9.2*  HGB 9.3* 8.3* 8.0* 7.9* 9.3*  HCT 31.2* 28.9* 27.5* 26.2* 30.6*  MCV 93.4 96.7 94.2  90.7 89.5  PLT 254 273 244 265 227   Cardiac Enzymes: No results for input(s): CKTOTAL, CKMB, CKMBINDEX, TROPONINI in the last 168 hours. BNP (last 3 results) No results for input(s): PROBNP in the last 8760 hours. CBG: Recent Labs  Lab 03/01/21 0737 03/01/21 1229 03/01/21 1752 03/01/21 2041 03/02/21 0812  GLUCAP 143* 134* 138* 161* 101*   D-Dimer: No results for input(s): DDIMER in the last 72 hours. Hgb A1c: No results for input(s): HGBA1C in the last 72 hours. Lipid Profile: No results for input(s): CHOL, HDL, LDLCALC, TRIG, CHOLHDL, LDLDIRECT in the last 72 hours. Thyroid function studies: No results for input(s): TSH, T4TOTAL, T3FREE, THYROIDAB in the last 72 hours.  Invalid input(s): FREET3 Anemia work up: No results for input(s): VITAMINB12, FOLATE, FERRITIN, TIBC, IRON, RETICCTPCT in the last 72 hours. Sepsis Labs: Recent Labs  Lab 02/25/21 0737 02/26/21 0519 02/28/21 0549 03/01/21 0719  WBC 15.2* 12.2* 12.0* 12.4*    Microbiology Recent Results (from the past 240 hour(s))  Resp Panel by RT-PCR (Flu A&B, Covid) Nasopharyngeal Swab     Status: None   Collection Time: 02/23/21  9:29 PM   Specimen: Nasopharyngeal Swab; Nasopharyngeal(NP) swabs in vial transport medium  Result Value Ref Range Status   SARS Coronavirus 2 by RT PCR NEGATIVE NEGATIVE Final    Comment: (NOTE) SARS-CoV-2 target nucleic acids are NOT DETECTED.  The SARS-CoV-2 RNA is generally detectable in upper respiratory specimens during the acute phase of infection. The  lowest concentration of SARS-CoV-2 viral copies this assay can detect is 138 copies/mL. A negative result does not preclude SARS-Cov-2 infection and should not be used as the sole basis for treatment or other patient management decisions. A negative result may occur with  improper specimen collection/handling, submission of specimen other than nasopharyngeal swab, presence of viral mutation(s) within the areas targeted by this assay, and inadequate number of viral copies(<138 copies/mL). A negative result must be combined with clinical observations, patient history, and epidemiological information. The expected result is Negative.  Fact Sheet for Patients:  EntrepreneurPulse.com.au  Fact Sheet for Healthcare Providers:  IncredibleEmployment.be  This test is no t yet approved or cleared by the [place] FDA and  has been authorized for detection and/or diagnosis of SARS-CoV-2 by FDA under an Emergency Use Authorization (EUA). This EUA will remain  in effect (meaning this test can be used) for the duration of the COVID-19 declaration under Section 564(b)(1) of the Act, 21 U.S.C.section 360bbb-3(b)(1), unless the authorization is terminated  or revoked sooner.       Influenza A by PCR NEGATIVE NEGATIVE Final   Influenza B by PCR NEGATIVE NEGATIVE Final    Comment: (NOTE) The Xpert Xpress SARS-CoV-2/FLU/RSV plus assay is intended as an aid in the diagnosis of influenza from Nasopharyngeal swab specimens and should not be used as a sole basis for treatment. Nasal washings and aspirates are unacceptable for Xpert Xpress SARS-CoV-2/FLU/RSV testing.  Fact Sheet for Patients: EntrepreneurPulse.com.au  Fact Sheet for Healthcare Providers: IncredibleEmployment.be  This test is not yet approved or cleared by the [place] FDA and has been authorized for detection and/or diagnosis of SARS-CoV-2 by FDA under  an Emergency Use Authorization (EUA). This EUA will remain in effect (meaning this test can be used) for the duration of the COVID-19 declaration under Section 564(b)(1) of the Act, 21 U.S.C. section 360bbb-3(b)(1), unless the authorization is terminated or revoked.  Performed at [facility], [address] [place], [place] [address]  Procedures and diagnostic studies:  No results found.             LOS: 6 days   Page Lancon  [facility]   Pager on [email or web address]. If 7PM-7AM, please contact night-coverage at [email or web address]     03/02/2021, 5:30 PM

## 2021-03-03 DIAGNOSIS — J9621 Acute and chronic respiratory failure with hypoxia: Secondary | ICD-10-CM | POA: Diagnosis not present

## 2021-03-03 DIAGNOSIS — N186 End stage renal disease: Secondary | ICD-10-CM | POA: Diagnosis not present

## 2021-03-03 DIAGNOSIS — I5033 Acute on chronic diastolic (congestive) heart failure: Secondary | ICD-10-CM | POA: Diagnosis not present

## 2021-03-03 LAB — RENAL FUNCTION PANEL
Albumin: 2.6 g/dL — ABNORMAL LOW (ref 3.5–5.0)
Anion gap: 9 (ref 5–15)
BUN: 44 mg/dL — ABNORMAL HIGH (ref 6–20)
CO2: 27 mmol/L (ref 22–32)
Calcium: 7.6 mg/dL — ABNORMAL LOW (ref 8.9–10.3)
Chloride: 97 mmol/L — ABNORMAL LOW (ref 98–111)
Creatinine, Ser: 3.98 mg/dL — ABNORMAL HIGH (ref 0.44–1.00)
GFR, Estimated: 13 mL/min — ABNORMAL LOW (ref >60)
Glucose, Bld: 155 mg/dL — ABNORMAL HIGH (ref 70–99)
Phosphorus: 4.7 mg/dL — ABNORMAL HIGH (ref 2.5–4.6)
Potassium: 3.3 mmol/L — ABNORMAL LOW (ref 3.5–5.1)
Sodium: 133 mmol/L — ABNORMAL LOW (ref 135–145)

## 2021-03-03 LAB — CBC
HCT: 24.6 % — ABNORMAL LOW (ref 36.0–46.0)
Hemoglobin: 7.4 g/dL — ABNORMAL LOW (ref 12.0–15.0)
MCH: 27.9 pg (ref 26.0–34.0)
MCHC: 30.1 g/dL (ref 30.0–36.0)
MCV: 92.8 fL (ref 80.0–100.0)
Platelets: 261 10*3/uL (ref 150–400)
RBC: 2.65 MIL/uL — ABNORMAL LOW (ref 3.87–5.11)
RDW: 15.8 % — ABNORMAL HIGH (ref 11.5–15.5)
WBC: 14.2 10*3/uL — ABNORMAL HIGH (ref 4.0–10.5)
nRBC: 0.4 % — ABNORMAL HIGH (ref 0.0–0.2)

## 2021-03-03 LAB — GLUCOSE, CAPILLARY
Glucose-Capillary: 143 mg/dL — ABNORMAL HIGH (ref 70–99)
Glucose-Capillary: 134 mg/dL — ABNORMAL HIGH (ref 70–99)
Glucose-Capillary: 123 mg/dL — ABNORMAL HIGH (ref 70–99)
Glucose-Capillary: 164 mg/dL — ABNORMAL HIGH (ref 70–99)

## 2021-03-03 NOTE — Progress Notes (Addendum)
[facility]  ROUNDING NOTE   Subjective:   Patient seen sitting in recliner Alert and oriented Denies shortness of breath at rest Continues to complain of discomfort with right leg during ambulation.  Creatinine 3.98   Objective:  Vital signs in last 24 hours:  Temp:  [98.1 F (36.7 C)-98.8 F (37.1 C)] 98.3 F (36.8 C) (01/19 0802) Pulse Rate:  [66-99] 77 (01/19 0802) Resp:  [17-19] 19 (01/19 0802) BP: (114-172)/(51-87) 114/56 (01/19 0802) SpO2:  [96 %-100 %] 98 % (01/19 0802)  Weight change:  Filed Weights   03/01/21 0500 03/02/21 1024  Weight: (!) 162.6 kg (!) 164.3 kg     Intake/Output: I/O last 3 completed shifts: In: 240 [P.O.:240] Out: 250 [Urine:250]   Intake/Output this shift:  Total I/O In: 240 [P.O.:240] Out: -   Physical Exam: General: NAD, Sitting in chair  Head: Normocephalic, atraumatic. Moist oral mucosal membranes  Eyes: Anicteric  Lungs:  Diminished bilaterally, 3.5L [place] O2  Heart: Irregular   Abdomen:  Soft, nontender, obese  Extremities:  1+ peripheral edema.  Neurologic: Nonfocal, moving all four extremities  Access:  RIJ permcath 05/05/53    Basic Metabolic Panel: Recent Labs  Lab 02/25/21 0737 02/26/21 0519 02/28/21 0549 03/01/21 0719 03/03/21 1023  NA 139 138 136 135 133*  K 6.1* 5.1 4.2 4.4 3.3*  CL 106 101 101 98 97*  CO2 23 25 27 24 27   GLUCOSE 139* 134* 177* 129* 155*  BUN 159* 122* 103* 62* 44*  CREATININE 6.77* 6.03* 6.13* 4.39* 3.98*  CALCIUM 8.8* 8.7* 8.4* 8.3* 7.6*  PHOS  --   --   --  4.4 4.7*     Liver Function Tests: Recent Labs  Lab 03/01/21 0719 03/03/21 1023  ALBUMIN 2.7* 2.6*    No results for input(s): LIPASE, AMYLASE in the last 168 hours. No results for input(s): AMMONIA in the last 168 hours.  CBC: Recent Labs  Lab 02/25/21 0737 02/26/21 0519 02/28/21 0549 03/01/21 0719 03/03/21 1023  WBC 15.2* 12.2* 12.0* 12.4* 14.2*  NEUTROABS 11.8* 9.3*  --  9.2*  --   HGB 8.3* 8.0*  7.9* 9.3* 7.4*  HCT 28.9* 27.5* 26.2* 30.6* 24.6*  MCV 96.7 94.2 90.7 89.5 92.8  PLT 273 244 265 227 261     Cardiac Enzymes: No results for input(s): CKTOTAL, CKMB, CKMBINDEX, TROPONINI in the last 168 hours.  BNP: Invalid input(s): POCBNP  CBG: Recent Labs  Lab 03/01/21 1752 03/01/21 2041 03/02/21 0812 03/02/21 2014 03/03/21 0800  GLUCAP 138* 161* 101* 148* 143*     Microbiology: Results for orders placed or performed during the hospital encounter of 02/23/21  Resp Panel by RT-PCR (Flu A&B, Covid) Nasopharyngeal Swab     Status: None   Collection Time: 02/23/21  9:29 PM   Specimen: Nasopharyngeal Swab; Nasopharyngeal(NP) swabs in vial transport medium  Result Value Ref Range Status   SARS Coronavirus 2 by RT PCR NEGATIVE NEGATIVE Final    Comment: (NOTE) SARS-CoV-2 target nucleic acids are NOT DETECTED.  The SARS-CoV-2 RNA is generally detectable in upper respiratory specimens during the acute phase of infection. The lowest concentration of SARS-CoV-2 viral copies this assay can detect is 138 copies/mL. A negative result does not preclude SARS-Cov-2 infection and should not be used as the sole basis for treatment or other patient management decisions. A negative result may occur with  improper specimen collection/handling, submission of specimen other than nasopharyngeal swab, presence of viral mutation(s) within the areas targeted  by this assay, and inadequate number of viral copies(<138 copies/mL). A negative result must be combined with clinical observations, patient history, and epidemiological information. The expected result is Negative.  Fact Sheet for Patients:  EntrepreneurPulse.com.au  Fact Sheet for Healthcare Providers:  IncredibleEmployment.be  This test is no t yet approved or cleared by the [place] FDA and  has been authorized for detection and/or diagnosis of SARS-CoV-2 by FDA under an Emergency Use  Authorization (EUA). This EUA will remain  in effect (meaning this test can be used) for the duration of the COVID-19 declaration under Section 564(b)(1) of the Act, 21 U.S.C.section 360bbb-3(b)(1), unless the authorization is terminated  or revoked sooner.       Influenza A by PCR NEGATIVE NEGATIVE Final   Influenza B by PCR NEGATIVE NEGATIVE Final    Comment: (NOTE) The Xpert Xpress SARS-CoV-2/FLU/RSV plus assay is intended as an aid in the diagnosis of influenza from Nasopharyngeal swab specimens and should not be used as a sole basis for treatment. Nasal washings and aspirates are unacceptable for Xpert Xpress SARS-CoV-2/FLU/RSV testing.  Fact Sheet for Patients: EntrepreneurPulse.com.au  Fact Sheet for Healthcare Providers: IncredibleEmployment.be  This test is not yet approved or cleared by the [place] FDA and has been authorized for detection and/or diagnosis of SARS-CoV-2 by FDA under an Emergency Use Authorization (EUA). This EUA will remain in effect (meaning this test can be used) for the duration of the COVID-19 declaration under Section 564(b)(1) of the Act, 21 U.S.C. section 360bbb-3(b)(1), unless the authorization is terminated or revoked.  Performed at [facility], [address] [place], [place] [address]     Coagulation Studies: No results for input(s): LABPROT, INR in the last 72 hours.   Urinalysis: No results for input(s): COLORURINE, LABSPEC, PHURINE, GLUCOSEU, HGBUR, BILIRUBINUR, KETONESUR, PROTEINUR, UROBILINOGEN, NITRITE, LEUKOCYTESUR in the last 72 hours.  Invalid input(s): APPERANCEUR      Imaging: No results found.   Medications:       amLODipine  5 mg Oral BID   apixaban  5 mg Oral BID   atorvastatin  20 mg Oral QHS   calcitRIOL  0.25 mcg Oral Daily   calcium carbonate  500 mg of elemental calcium Oral Q breakfast   Chlorhexidine Gluconate Cloth  6 each Topical Q0600    cloNIDine  0.2 mg Oral BID   epoetin (EPOGEN/PROCRIT) injection  10,000 Units Intravenous Q M,W,F-HD   folic acid  1,017 mcg Oral Daily   insulin aspart  0-9 Units Subcutaneous TID AC & HS   levothyroxine  112 mcg Oral QAC breakfast   metoprolol tartrate  100 mg Oral BID   Vitamin D (Ergocalciferol)  50,000 Units Oral Q Fri   acetaminophen **OR** acetaminophen, albuterol, magnesium hydroxide, melatonin, morphine injection, ondansetron **OR** ondansetron (ZOFRAN) IV, traZODone  Assessment/ Plan:  Ms. BELENDA Schmidt is a 56 y.o. white female hypertension, hyperlipidemia, diabetes mellitus type II, diastolic congestive heart failure, hypothyroidism, history of breast cancer, atrial fibrillation and pulmonary embolism who was admitted to [facility] on 02/23/2021 for Cardiorenal syndrome with renal failure [I13.10] Acute on chronic renal failure ([place]) [N17.9, N18.9] AKI (acute kidney injury) ([place]) [N17.9]  Acute Kidney Injury on chronic kidney disease stage IV: baseline creatinine of 2.43 on 01/10/21. 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 Requiring hemodialysis. Dialysis initiated on 02/25/21.   -Received dialysis yesterday, UF 1L achieved.  - Next treatment schedule for Saturday to accommodate outpatient. -Appreciate dialysis  coordinator arranging outpatient treatment at [facility] on TTS schedule.  Patient may receive rehab at [facility]. Will monitor and make adjustments to outpatient clinic as needed.   Anemia of chronic kidney disease: Iron levels consistent with iron deficiency. Hgb below target. Will order EPO 10000 units IV with treatments.   Hypertension with chronic kidney disease: 118/49. On amlodipine, clonidine and metoprolol.   Hematuria: Ureteral stent placed by urology on 02/14/2021.  With recent episode of nephrolithiasis.  No reports on hematuria  Secondary Hyperparathyroidism: PTH on 12/30/20 was 72. Currently without binders. - Continue  calcitriol and ergocalciferol.   Acute respiratory failure: increased oxygen requirements from her baseline of 2 liters. Secondary to pneumonia and pulmonary edema.  Remains on 3.5 L [place]   LOS: 7 Jaxen Samples 1/19/202311:43 AM

## 2021-03-03 NOTE — H&P (Signed)
Physical Medicine and Rehabilitation Admission H&P    Chief Complaint  Patient presents with   Shortness of Breath  : HPI: Nicole Schmidt is a 56 year old right-handed female with history of chronic hypoxemic respiratory failure on 2 L oxygen, diabetes mellitus, breast cancer, history of hematuria left ureteral stone with recent left uteroscopy with laser lithotripsy and stent exchange ureteral stent per Dr.Sninsky, diabetes mellitus, hypertension, hyperlipidemia, gout, morbid obesity with BMI 62.17, atrial fibrillation/pulmonary emboli maintained on Eliquis.  Recent admission 02/01/2021 - 02/22/2021 for management of acute on chronic diastolic congestive heart failure and AKI superimposed on CKD stage III.  Per chart review patient lives alone.  1 level home 2 steps to entry.  Reports independent without assistive device prior to admission.  Presented to [facility] 02/23/2021 ER with increasing lower extremity edema and increasing shortness of breath and diminished urine output.  Chest x-ray showed cardiomegaly subtle prominence of interstitial markings in the perihilar regions more so on the right side possibly suggesting mild asymmetric interstitial pulmonary edema.  Noted interval decrease in patchy infiltrates in the perihilar regions and lower lung fields suggesting resolving atelectasis/pneumonia.  Admission chemistries glucose 128 BUN 136 creatinine 5.82, WBC 16,600 hemoglobin 8.8, troponin 10, BNP 375.  Follow-up nephrology services permacath was placed 02/25/2021 hemodialysis initiated for ESRD and fluid overload and latest creatinine 4.39.  She remains on chronic Eliquis for history of atrial fibrillation pulmonary emboli.  Therapy evaluations completed due to patient decreased functional mobility was admitted for a comprehensive rehab program.  Review of Systems  Constitutional:  Positive for malaise/fatigue. Negative for chills and fever.  HENT:  Negative for hearing loss.    Eyes:  Negative for blurred vision and double vision.  Respiratory:  Positive for shortness of breath. Negative for cough and wheezing.   Cardiovascular:  Positive for palpitations and leg swelling. Negative for chest pain.  Gastrointestinal:  Positive for constipation. Negative for heartburn, nausea and vomiting.  Genitourinary:  Positive for hematuria. Negative for dysuria and flank pain.  Musculoskeletal:  Positive for joint pain and myalgias.  Skin:  Negative for rash.  Neurological:  Positive for weakness.  All other systems reviewed and are negative. Past Medical History:  Diagnosis Date   (HFpEF) heart failure with preserved ejection fraction ([place])    a. 10/2018 Echo: EF 60-65%, diast dysfxn, RVSP 50.7 mmHg, mildly dil LA.   Acquired hypothyroidism 02/09/2020   Anemia    Arthritis    Breast cancer ([place]) 2014   right breast cancer   Breast cancer of upper-inner quadrant of right female breast ([place])    Right breast invasive CA and DCIS , 7 mm T1,N0,M0. Er/PR pos, her 2 negative.  Margins 1 mm.   CHF (congestive heart failure) (HCC)    CKD (chronic kidney disease), stage III (HCC)    Diabetes mellitus type 2 in obese ([place]) 02/09/2020   Diabetes mellitus without complication ([place])    Dyslipidemia 02/09/2020   Essential hypertension    Gout    Hypothyroidism    Menopause    age 56   Morbid obesity ([place])    Persistent atrial fibrillation ([place])    a. Dx 10/2018 in setting of PNA. CHA2DS2VASc = 4-->Eliquis; b. 11/2018 s/p successful DCCV.   Personal history of radiation therapy    Pulmonary embolism ([place]) 10/2014   a. Chronic eliquis.   Thyroid goiter    Past Surgical History:  Procedure Laterality Date   BREAST BIOPSY Right 2014  breast ca   BREAST EXCISIONAL BIOPSY Right 07/19/2012   breast ca   BREAST SURGERY Right 2014   with sentinel node bx subareaolar duct excision   CARDIOVERSION N/A 11/28/2018   Procedure: CARDIOVERSION;  Surgeon: Minna Merritts, MD;   Location: [facility] ORS;  Service: Cardiovascular;  Laterality: N/A;   COLONOSCOPY WITH PROPOFOL N/A 01/30/2017   Procedure: COLONOSCOPY WITH PROPOFOL;  Surgeon: Christene Lye, MD;  Location: [facility] ENDOSCOPY;  Service: Endoscopy;  Laterality: N/A;   CYSTOSCOPY W/ URETERAL STENT PLACEMENT Left 02/14/2021   Procedure: CYSTOSCOPY WITH RETROGRADE PYELOGRAM/URETERAL STENT PLACEMENT;  Surgeon: Billey Co, MD;  Location: [facility] ORS;  Service: Urology;  Laterality: Left;   DIALYSIS/PERMA CATHETER INSERTION N/A 02/24/2021   Procedure: DIALYSIS/PERMA CATHETER INSERTION;  Surgeon: Algernon Huxley, MD;  Location: [facility];  Service: Cardiovascular;  Laterality: N/A;   Family History  Problem Relation Age of Onset   Diabetes Father    Hypertension Father    Parkinson's disease Father    Hypertension Mother    Rheum arthritis Mother    Atrial fibrillation Mother    Heart failure Mother    Heart attack Mother        died in her mid-70's.   Colon cancer Cousin 36   Breast cancer Neg Hx    Social History:  reports that she has never smoked. She has never used smokeless tobacco. She reports that she does not drink alcohol and does not use drugs. Allergies: No Known Allergies Medications Prior to Admission  Medication Sig Dispense Refill   acetaminophen (TYLENOL) 500 MG tablet Take 1,000 mg by mouth every 6 (six) hours as needed (for pain).     albuterol (VENTOLIN HFA) 108 (90 Base) MCG/ACT inhaler Inhale 2 puffs into the lungs every 6 (six) hours as needed for wheezing or shortness of breath.     amLODipine (NORVASC) 5 MG tablet Take 5 mg by mouth 2 (two) times daily.     atorvastatin (LIPITOR) 20 MG tablet Take 20 mg by mouth at bedtime.      calcitRIOL (ROCALTROL) 0.25 MCG capsule Take 0.25 mcg by mouth daily.     calcium carbonate (OSCAL) 1500 (600 Ca) MG TABS tablet Take 600 mg of elemental calcium by mouth daily.      Cholecalciferol (VITAMIN D3) 50000 units TABS Take 50,000 Units by  mouth every Friday.      cloNIDine (CATAPRES) 0.2 MG tablet Take 1 tablet (0.2 mg total) by mouth 2 (two) times daily. 60 tablet 0   Dulaglutide 1.5 MG/0.5ML SOPN Inject 1.5 mg into the skin every 7 (seven) days.     ELIQUIS 5 MG TABS tablet TAKE 1 TABLET TWICE A DAY 180 tablet 3   levothyroxine (SYNTHROID, LEVOTHROID) 112 MCG tablet Take 112 mcg daily before breakfast by mouth.     Melatonin 10 MG TABS Take 10 mg by mouth at bedtime.     metoprolol tartrate (LOPRESSOR) 100 MG tablet Take 1 tablet (100 mg total) by mouth 2 (two) times daily. 60 tablet 0   folic acid (FOLVITE) 749 MCG tablet Take 800 mcg by mouth daily.  (Patient not taking: Reported on 02/24/2021)     furosemide (LASIX) 40 MG tablet Take 40 mg by mouth daily. (Patient not taking: Reported on 02/24/2021)      Drug Regimen Review Drug regimen was reviewed and remains appropriate with no significant issues identified  Home: Home Living Family/patient expects to be discharged to:: Private residence Living  Arrangements: Alone Available Help at Discharge: Family, Friend(s), Available PRN/intermittently Type of Home: House Home Access: Stairs to enter [employer] of Steps: 2 in front and 4 in back Entrance Stairs-Rails: Right, Left Home Layout: One level Bathroom Shower/Tub: Multimedia programmer: Handicapped height Bathroom Accessibility: Yes Home Equipment: Cane - single point, Rollator (4 wheels), Conservation officer, nature (2 wheels), BSC/3in1, Wheelchair - manual, Shower seat Additional Comments: Pt currently lives in her mother's old home that is 1 level with level entry, but planning to move to a new house as soon as possible that's 1 level with 4 steps to enter with B rails. She does not want to return to mother's home as she believe it has mold.   Functional History: Prior Function Prior Level of Function : Independent/Modified Independent Mobility Comments: Pt reports independence without AD, no falls,  driving. ADLs Comments: Pt reports independence in self care and IADL tasks  Functional Status:  Mobility: Bed Mobility General bed mobility comments: not performed as pt received in chair Transfers Overall transfer level: Needs assistance Equipment used: Rolling walker (2 wheels) Transfers: Sit to/from Stand Sit to Stand: Supervision General transfer comment: safe technique. Still requires heavy B UE assistance in order to stand. Once standing, upright posture noted Ambulation/Gait Ambulation/Gait assistance: Supervision Gait Distance (Feet): 55 Feet Assistive device: Rolling walker (2 wheels) Gait Pattern/deviations: Step-through pattern General Gait Details: ambulated with step to gait pattern. 1 standing rest break required. Heavy WBing through B UE due to R foot pain. Pt on 2L of O2 with exertion decreasing to 86% with ambulation. Gait velocity: decreased    ADL: ADL Overall ADL's : Needs assistance/impaired Toilet Transfer: Minimal assistance, BSC/3in1, Rolling walker (2 wheels) Toilet Transfer Details (indicate cue type and reason): simulated Toileting- Clothing Manipulation and Hygiene: Moderate assistance, Sit to/from stand Functional mobility during ADLs: Minimal assistance, Rolling walker (2 wheels)  Cognition: Cognition Overall Cognitive Status: Within Functional Limits for tasks assessed Orientation Level: Oriented X4 Cognition Arousal/Alertness: Awake/alert Behavior During Therapy: WFL for tasks assessed/performed Overall Cognitive Status: Within Functional Limits for tasks assessed General Comments: very motivated to participate, however frustrated with pain levels  Physical Exam: Blood pressure 138/60, pulse 84, temperature 98 F (36.7 C), temperature source Oral, resp. rate 20, height 5\' 4"  (1.626 m), weight (!) 164.3 kg, last menstrual period 07/17/2012, SpO2 96 %. Gen: no distress, normal appearing HEENT: oral mucosa pink and moist, nasal cannula in  place Cardio: Irregular Chest: normal effort, normal rate of breathing, diminished breath sounds bilaterally Abd: soft, non-distended Ext: 1+ bilateral lower extremity edema Psych: pleasant, normal affect Skin: RIJ permacath C/D/I, dry and scaly skin on bilateral feet. Neurological:     Comments: Patient is alert.  No acute distress.  Mood is a bit flat but appropriate.  Follows commands.  Oriented x3. Allodynia over lateral aspect of right foot. Decreased sensation bilateral feet  Results for orders placed or performed during the hospital encounter of 02/23/21 (from the past 48 hour(s))  Glucose, capillary     Status: Abnormal   Collection Time: 03/02/21  8:12 AM  Result Value Ref Range   Glucose-Capillary 101 (H) 70 - 99 mg/dL    Comment: Glucose reference range applies only to samples taken after fasting for at least 8 hours.  Glucose, capillary     Status: Abnormal   Collection Time: 03/02/21  8:14 PM  Result Value Ref Range   Glucose-Capillary 148 (H) 70 - 99 mg/dL    Comment: Glucose reference range  applies only to samples taken after fasting for at least 8 hours.  Glucose, capillary     Status: Abnormal   Collection Time: 03/03/21  8:00 AM  Result Value Ref Range   Glucose-Capillary 143 (H) 70 - 99 mg/dL    Comment: Glucose reference range applies only to samples taken after fasting for at least 8 hours.  Renal function panel     Status: Abnormal   Collection Time: 03/03/21 10:23 AM  Result Value Ref Range   Sodium 133 (L) 135 - 145 mmol/L   Potassium 3.3 (L) 3.5 - 5.1 mmol/L   Chloride 97 (L) 98 - 111 mmol/L   CO2 27 22 - 32 mmol/L   Glucose, Bld 155 (H) 70 - 99 mg/dL    Comment: Glucose reference range applies only to samples taken after fasting for at least 8 hours.   BUN 44 (H) 6 - 20 mg/dL   Creatinine, Ser 3.98 (H) 0.44 - 1.00 mg/dL   Calcium 7.6 (L) 8.9 - 10.3 mg/dL   Phosphorus 4.7 (H) 2.5 - 4.6 mg/dL   Albumin 2.6 (L) 3.5 - 5.0 g/dL   GFR, Estimated 13 (L) >60  mL/min    Comment: (NOTE) Calculated using the CKD-EPI Creatinine Equation (2021)    Anion gap 9 5 - 15    Comment: Performed at [facility], [address] [place], [place] [address]  CBC     Status: Abnormal   Collection Time: 03/03/21 10:23 AM  Result Value Ref Range   WBC 14.2 (H) 4.0 - 10.5 K/uL   RBC 2.65 (L) 3.87 - 5.11 MIL/uL   Hemoglobin 7.4 (L) 12.0 - 15.0 g/dL   HCT 24.6 (L) 36.0 - 46.0 %   MCV 92.8 80.0 - 100.0 fL   MCH 27.9 26.0 - 34.0 pg   MCHC 30.1 30.0 - 36.0 g/dL   RDW 15.8 (H) 11.5 - 15.5 %   Platelets 261 150 - 400 K/uL   nRBC 0.4 (H) 0.0 - 0.2 %    Comment: Performed at [facility], [address] [place], [place] [address]  Glucose, capillary     Status: Abnormal   Collection Time: 03/03/21 11:50 AM  Result Value Ref Range   Glucose-Capillary 134 (H) 70 - 99 mg/dL    Comment: Glucose reference range applies only to samples taken after fasting for at least 8 hours.  Glucose, capillary     Status: Abnormal   Collection Time: 03/03/21  4:51 PM  Result Value Ref Range   Glucose-Capillary 123 (H) 70 - 99 mg/dL    Comment: Glucose reference range applies only to samples taken after fasting for at least 8 hours.  Glucose, capillary     Status: Abnormal   Collection Time: 03/03/21  8:52 PM  Result Value Ref Range   Glucose-Capillary 164 (H) 70 - 99 mg/dL    Comment: Glucose reference range applies only to samples taken after fasting for at least 8 hours.   No results found.     Medical Problem List and Plan: 1. Functional deficits/debility secondary to acute on chronic renal failure superimposed with diastolic congestive heart failure  -patient may shower  -ELOS/Goals: 10-14 days modI  -Admit to CIR 2.  Antithrombotics: -DVT/anticoagulation:  Pharmaceutical: Other (comment) Eliquis  -antiplatelet therapy: N/A 3. Right foot pain: Tylenol as needed. D/c Dilaudid 4. Mood: Melatonin as needed  -antipsychotic agents: N/A 5.  Neuropsych: This patient is capable of making decisions on her own behalf. 6.  Skin/Wound Care: Routine skin checks 7. Fluids/Electrolytes/Nutrition: Routine in and outs with follow-up chemistries 8.  End-stage renal disease.  Permacath placed 02/25/2021 hemodialysis initiated 9.  History of hematuria.  Left ureteral stone with recent uteroscopy/laser lithotripsy.  Follow-up urology Dr Diamantina Providence 10.  Hypothyroidism.  Synthroid 11.  Diabetes mellitus.  Hemoglobin A1c 7.2.  SSI. 12.  Hypothyroidism.  Synthroid 13.  Hypertension.  Clonidine 0.2 mg twice daily, Norvasc 5 mg twice daily.  Monitor with increased mobility 14.  Hyperlipidemia.  Lipitor 15.  Atrial fibrillation/history of pulmonary emboli.  Continue Eliquis.  Lopressor 100 mg twice daily 16.  Acute on chronic anemia.  Transfuse 1 unit RBCs 03/04/2021.  Follow-up CBC 17.  Obesity.  BMI 62.17.  Dietary follow-up 18.  Chronic hypoxemic respiratory failure.  Continue oxygen therapy as directed as well as CPAP 19. Constipation: discussed trying suppository versus milk of magnesia.  20. Dry skin: order Eucerin cream.   I have personally performed a face to face diagnostic evaluation, including, but not limited to relevant history and physical exam findings, of this patient and developed relevant assessment and plan.  Additionally, I have reviewed and concur with the physician assistant's documentation above.  Leeroy Cha, MD  Lavon Paganini McIntosh, PA-C 03/04/2021

## 2021-03-03 NOTE — Progress Notes (Signed)
Once patient is discharged from current admission, will schedule a one month follow up with Dr. Diamantina Providence and he will determine when next surgery is to be scheduled.

## 2021-03-03 NOTE — Progress Notes (Addendum)
Progress Note    IVANNAH Schmidt  CNO:[id number] DOB: Mar 07, 1965  DOA: 02/23/2021 PCP: Lenard Simmer, MD      Brief Narrative:    Medical records reviewed and are as summarized below:  Nicole Schmidt is a 56 y.o. female with medical history significant for type II DM, hypertension, dyslipidemia, hypothyroidism, gout, chronic diastolic CHF, chronic hypoxemic respiratory failure on 2 L oxygen, atrial fibrillation and PE on Eliquis, recent hospitalization from 02/01/2021 through 02/22/2021 for CHF exacerbation and AKI.  She presented to the hospital because of worsening lower extremity edema and shortness of breath.  She was admitted for acute on chronic CHF, acute on chronic hypoxemic respiratory failure and progression of CKD stage IV to ESRD.  Permacath was placed on 02/25/2021 and hemodialysis was initiated for ESRD and fluid overload.    Assessment/Plan:   Principal Problem:   Acute on chronic renal failure (HCC) Active Problems:   Acute renal failure superimposed on stage 4 chronic kidney disease (HCC)   Acute on chronic diastolic CHF (congestive heart failure) (HCC)   Obstructive sleep apnea   Morbid obesity with BMI of 60.0-69.9, adult (HCC)   Diabetes mellitus type 2 in obese ([place])   Acute on chronic respiratory failure with hypoxemia (HCC)   AF (paroxysmal atrial fibrillation) (HCC)   Chronic respiratory failure with hypoxia (HCC)   ESRD (end stage renal disease) ([place])    Body mass index is 62.17 kg/m.  (Morbid obesity)   AKI on CKD stage IV with progression to ESRD, acute on chronic diastolic CHF, anasarca: Hemodialysis was initiated on 02/25/2021.  She is scheduled for hemodialysis on Mondays, Wednesdays and Fridays.  Follow-up with nephrologist for hemodialysis.  Acute on chronic hypoxemic respiratory failure: Continue 3 L/min oxygen via nasal cannula.  She uses 2 L oxygen at home.  Hypokalemia: Treatment will be deferred to nephrologist because of  ESRD.  Anemia of chronic disease: Monitor H&H.  No indication for blood transfusion at this time  OSA: Continue CPAP at night  Generalized weakness: PT recommended discharge to inpatient rehab for further rehabilitation  Paroxysmal atrial fibrillation, history of pulmonary embolism: Continue Eliquis  History of hematuria left ureteral stone with recent left uteroscopy with laser lithotripsy and stent exchange ureteral stent  Other comorbidities include hypertension, hyperlipidemia     Diet Order             Diet renal/carb modified with fluid restriction Diet-HS Snack? Nothing; Fluid restriction: 1200 mL Fluid; Room service appropriate? Yes; Fluid consistency: Thin  Diet effective now                      Consultants: Nephrologist  Procedures: Right IJ cuff tunneled hemodialysis catheter    Medications:    amLODipine  5 mg Oral BID   apixaban  5 mg Oral BID   atorvastatin  20 mg Oral QHS   calcitRIOL  0.25 mcg Oral Daily   calcium carbonate  500 mg of elemental calcium Oral Q breakfast   Chlorhexidine Gluconate Cloth  6 each Topical Q0600   cloNIDine  0.2 mg Oral BID   epoetin (EPOGEN/PROCRIT) injection  10,000 Units Intravenous Q M,W,F-HD   folic acid  2,947 mcg Oral Daily   insulin aspart  0-9 Units Subcutaneous TID AC & HS   levothyroxine  112 mcg Oral QAC breakfast   metoprolol tartrate  100 mg Oral BID   Vitamin D (Ergocalciferol)  50,000 Units Oral Q Fri  Continuous Infusions:   Anti-infectives (From admission, onward)    Start     Dose/Rate Route Frequency Ordered Stop   02/24/21 1645  ceFAZolin (ANCEF) IVPB 1 g/50 mL premix        over 30 Minutes  Continuous PRN 02/24/21 1659 02/24/21 1645   02/24/21 1627  ceFAZolin (ANCEF) IVPB 1 g/50 mL premix  Status:  Discontinued        1 g 100 mL/hr over 30 Minutes Intravenous 30 min pre-op 02/24/21 1627 02/24/21 1816   02/24/21 1442  ceFAZolin (ANCEF) IVPB 2g/100 mL premix  Status:  Discontinued         2 g 200 mL/hr over 30 Minutes Intravenous 30 min pre-op 02/24/21 1442 02/24/21 1627              Family Communication/Anticipated D/C date and plan/Code Status   DVT prophylaxis:  apixaban (ELIQUIS) tablet 5 mg     Code Status: Full Code  Family Communication: None Disposition Plan: Plan to discharge to inpatient rehab   Status is: Inpatient  Remains inpatient appropriate because: Awaiting placement to inpatient rehab           Subjective:   Interval events noted.  She complains of pain in the right ankle which she attributes to "fluid".  No chest pain or shortness of breath   Objective:    Vitals:   03/02/21 2017 03/02/21 2306 03/03/21 0802 03/03/21 1154  BP: 138/72 (!) 115/51 (!) 114/56 (!) 118/49  Pulse: 95 66 77 72  Resp: 19 18 19 18   Temp: 98.2 F (36.8 C) 98.3 F (36.8 C) 98.3 F (36.8 C) 98.2 F (36.8 C)  TempSrc:      SpO2: 97% 97% 98% 97%  Weight:      Height:       No data found.   Intake/Output Summary (Last 24 hours) at 03/03/2021 1703 Last data filed at 03/03/2021 1330 Gross per 24 hour  Intake 720 ml  Output 300 ml  Net 420 ml   Filed Weights   03/01/21 0500 03/02/21 1024  Weight: (!) 162.6 kg (!) 164.3 kg    Exam:  GEN: NAD SKIN: Warm and dry EYES: No pallor or icterus ENT: MMM CV: RRR PULM: CTA B ABD: soft, obese/distended, NT, +BS CNS: AAO x 3, non focal EXT: Bilateral leg edema.  Mild right ankle tenderness but no leg tenderness         Data Reviewed:   I have personally reviewed following labs and imaging studies:  Labs: Labs show the following:   Basic Metabolic Panel: Recent Labs  Lab 02/25/21 0737 02/26/21 0519 02/28/21 0549 03/01/21 0719 03/03/21 1023  NA 139 138 136 135 133*  K 6.1* 5.1 4.2 4.4 3.3*  CL 106 101 101 98 97*  CO2 23 25 27 24 27   GLUCOSE 139* 134* 177* 129* 155*  BUN 159* 122* 103* 62* 44*  CREATININE 6.77* 6.03* 6.13* 4.39* 3.98*  CALCIUM 8.8* 8.7* 8.4* 8.3* 7.6*   PHOS  --   --   --  4.4 4.7*   GFR Estimated Creatinine Clearance: 24.3 mL/min (A) (by C-G formula based on SCr of 3.98 mg/dL (H)). Liver Function Tests: Recent Labs  Lab 03/01/21 0719 03/03/21 1023  ALBUMIN 2.7* 2.6*   No results for input(s): LIPASE, AMYLASE in the last 168 hours. No results for input(s): AMMONIA in the last 168 hours. Coagulation profile No results for input(s): INR, PROTIME in the last 168 hours.  CBC: Recent  Labs  Lab 02/25/21 0737 02/26/21 0519 02/28/21 0549 03/01/21 0719 03/03/21 1023  WBC 15.2* 12.2* 12.0* 12.4* 14.2*  NEUTROABS 11.8* 9.3*  --  9.2*  --   HGB 8.3* 8.0* 7.9* 9.3* 7.4*  HCT 28.9* 27.5* 26.2* 30.6* 24.6*  MCV 96.7 94.2 90.7 89.5 92.8  PLT 273 244 265 227 261   Cardiac Enzymes: No results for input(s): CKTOTAL, CKMB, CKMBINDEX, TROPONINI in the last 168 hours. BNP (last 3 results) No results for input(s): PROBNP in the last 8760 hours. CBG: Recent Labs  Lab 03/02/21 0812 03/02/21 2014 03/03/21 0800 03/03/21 1150 03/03/21 1651  GLUCAP 101* 148* 143* 134* 123*   D-Dimer: No results for input(s): DDIMER in the last 72 hours. Hgb A1c: No results for input(s): HGBA1C in the last 72 hours. Lipid Profile: No results for input(s): CHOL, HDL, LDLCALC, TRIG, CHOLHDL, LDLDIRECT in the last 72 hours. Thyroid function studies: No results for input(s): TSH, T4TOTAL, T3FREE, THYROIDAB in the last 72 hours.  Invalid input(s): FREET3 Anemia work up: No results for input(s): VITAMINB12, FOLATE, FERRITIN, TIBC, IRON, RETICCTPCT in the last 72 hours. Sepsis Labs: Recent Labs  Lab 02/26/21 0519 02/28/21 0549 03/01/21 0719 03/03/21 1023  WBC 12.2* 12.0* 12.4* 14.2*    Microbiology Recent Results (from the past 240 hour(s))  Resp Panel by RT-PCR (Flu A&B, Covid) Nasopharyngeal Swab     Status: None   Collection Time: 02/23/21  9:29 PM   Specimen: Nasopharyngeal Swab; Nasopharyngeal(NP) swabs in vial transport medium  Result  Value Ref Range Status   SARS Coronavirus 2 by RT PCR NEGATIVE NEGATIVE Final    Comment: (NOTE) SARS-CoV-2 target nucleic acids are NOT DETECTED.  The SARS-CoV-2 RNA is generally detectable in upper respiratory specimens during the acute phase of infection. The lowest concentration of SARS-CoV-2 viral copies this assay can detect is 138 copies/mL. A negative result does not preclude SARS-Cov-2 infection and should not be used as the sole basis for treatment or other patient management decisions. A negative result may occur with  improper specimen collection/handling, submission of specimen other than nasopharyngeal swab, presence of viral mutation(s) within the areas targeted by this assay, and inadequate number of viral copies(<138 copies/mL). A negative result must be combined with clinical observations, patient history, and epidemiological information. The expected result is Negative.  Fact Sheet for Patients:  EntrepreneurPulse.com.au  Fact Sheet for Healthcare Providers:  IncredibleEmployment.be  This test is no t yet approved or cleared by the [place] FDA and  has been authorized for detection and/or diagnosis of SARS-CoV-2 by FDA under an Emergency Use Authorization (EUA). This EUA will remain  in effect (meaning this test can be used) for the duration of the COVID-19 declaration under Section 564(b)(1) of the Act, 21 U.S.C.section 360bbb-3(b)(1), unless the authorization is terminated  or revoked sooner.       Influenza A by PCR NEGATIVE NEGATIVE Final   Influenza B by PCR NEGATIVE NEGATIVE Final    Comment: (NOTE) The Xpert Xpress SARS-CoV-2/FLU/RSV plus assay is intended as an aid in the diagnosis of influenza from Nasopharyngeal swab specimens and should not be used as a sole basis for treatment. Nasal washings and aspirates are unacceptable for Xpert Xpress SARS-CoV-2/FLU/RSV testing.  Fact Sheet for  Patients: EntrepreneurPulse.com.au  Fact Sheet for Healthcare Providers: IncredibleEmployment.be  This test is not yet approved or cleared by the [place] FDA and has been authorized for detection and/or diagnosis of SARS-CoV-2 by FDA under an Emergency Use Authorization (EUA). This  EUA will remain in effect (meaning this test can be used) for the duration of the COVID-19 declaration under Section 564(b)(1) of the Act, 21 U.S.C. section 360bbb-3(b)(1), unless the authorization is terminated or revoked.  Performed at [facility], [address] [place], [place] [address]     Procedures and diagnostic studies:  No results found.             LOS: 7 days   Ayano Douthitt  [facility]   Pager on [email or web address]. If 7PM-7AM, please contact night-coverage at [email or web address]     03/03/2021, 5:03 PM

## 2021-03-03 NOTE — Progress Notes (Signed)
Physical Therapy Treatment Patient Details Name: Nicole Schmidt MRN: [id number] DOB: 01-15-1966 Today's Date: 03/03/2021   History of Present Illness Pt is a 56 y/o F admitted on 02/23/21 after presenting to the ER with worsening LE edema & increased dyspnea. Pt has a permacath placed & started dialysis on 02/25/21. Pt is being treated for AKI superimposed on stage IV CKD, acute on chronic diastolic CHF with volume overload. Pt with recent admission 02/01/21-02/22/21 for management of acute on chronic diastolic CHF & AKI superimposed on CKD stage 3. PMH: DM2, HTN, dyslipidemia, hypothyroidism, gout, HFpEF, a-fib & PE on eliquis, breast CA, obesity    PT Comments    Pt is making good progress towards goals. Spent time discussing back up plan while awaiting insur approval for CIR. Discussed assistance level required for all ADLs and OOB mobility. Pt still very limited by pain/endurance. Pt premedicated via morphine and still rating R foot pain at 7/10 with mobility/Wbing. On less O2 this date, however desats to 86% on 2L with exertion, taking extended time for recovery to 91%. Rates short distance ambulation at 8/10 on RPE scale. Woudn't be safe to dc home alone, pt in agreement for SNF as back up plan if CIR declined. Sent message to care team.   Recommendations for follow up therapy are one component of a multi-disciplinary discharge planning process, led by the attending physician.  Recommendations may be updated based on patient status, additional functional criteria and insurance authorization.  Follow Up Recommendations  Acute inpatient rehab (3hours/day)     Assistance Recommended at Discharge Intermittent Supervision/Assistance  Patient can return home with the following Help with stairs or ramp for entrance;Assist for transportation;Assistance with cooking/housework;A little help with walking and/or transfers;A little help with bathing/dressing/bathroom   Equipment Recommendations  None  recommended by PT    Recommendations for Other Services       Precautions / Restrictions Precautions Precautions: Fall Restrictions Weight Bearing Restrictions: No     Mobility  Bed Mobility               General bed mobility comments: not performed as pt received in chair    Transfers Overall transfer level: Needs assistance Equipment used: Rolling walker (2 wheels) Transfers: Sit to/from Stand Sit to Stand: Supervision           General transfer comment: safe technique. Still requires heavy B UE assistance in order to stand. Once standing, upright posture noted    Ambulation/Gait Ambulation/Gait assistance: Supervision Gait Distance (Feet): 55 Feet Assistive device: Rolling walker (2 wheels) Gait Pattern/deviations: Step-through pattern       General Gait Details: ambulated with step to gait pattern. 1 standing rest break required. Heavy WBing through B UE due to R foot pain. Pt on 2L of O2 with exertion decreasing to 86% with ambulation.   Stairs             Wheelchair Mobility    Modified Rankin (Stroke Patients Only)       Balance Overall balance assessment: Mild deficits observed, not formally tested Sitting-balance support: Feet supported Sitting balance-Leahy Scale: Good     Standing balance support: During functional activity, Reliant on assistive device for balance Standing balance-Leahy Scale: Fair                              Cognition Arousal/Alertness: Awake/alert Behavior During Therapy: WFL for tasks assessed/performed Overall Cognitive Status: Within Functional Limits for  tasks assessed                                 General Comments: very motivated to participate, however frustrated with pain levels        Exercises Other Exercises Other Exercises: pt reports she's been performing her seated ther-ex indep    General Comments        Pertinent Vitals/Pain Pain Assessment Pain  Assessment: 0-10 Pain Score: 7  Pain Location: R foot Pain Descriptors / Indicators: Discomfort, Aching Pain Intervention(s): Limited activity within patient's tolerance    Home Living                          Prior Function            PT Goals (current goals can now be found in the care plan section) Acute Rehab PT Goals Patient Stated Goal: get better, return to PLOF PT Goal Formulation: With patient Time For Goal Achievement: 03/13/21 Potential to Achieve Goals: Good Progress towards PT goals: Progressing toward goals    Frequency    Min 2X/week      PT Plan Current plan remains appropriate    Co-evaluation              AM-PAC PT "6 Clicks" Mobility   Outcome Measure  Help needed turning from your back to your side while in a flat bed without using bedrails?: None Help needed moving from lying on your back to sitting on the side of a flat bed without using bedrails?: A Little Help needed moving to and from a bed to a chair (including a wheelchair)?: A Little Help needed standing up from a chair using your arms (e.g., wheelchair or bedside chair)?: A Little Help needed to walk in hospital room?: A Lot Help needed climbing 3-5 steps with a railing? : A Lot 6 Click Score: 17    End of Session Equipment Utilized During Treatment: Oxygen Activity Tolerance: Patient tolerated treatment well Patient left: in chair;with call bell/phone within reach Nurse Communication: Mobility status PT Visit Diagnosis: Unsteadiness on feet (R26.81);Muscle weakness (generalized) (M62.81);Difficulty in walking, not elsewhere classified (R26.2)     Time: 5993-5701 PT Time Calculation (min) (ACUTE ONLY): 24 min  Charges:  $Gait Training: 23-37 mins                     Greggory Stallion, PT, DPT, GCS [phone]    Zurie Platas 03/03/2021, 2:22 PM

## 2021-03-04 ENCOUNTER — Encounter (HOSPITAL_COMMUNITY): Payer: Self-pay | Admitting: Physical Medicine and Rehabilitation

## 2021-03-04 ENCOUNTER — Inpatient Hospital Stay (HOSPITAL_COMMUNITY)
Admission: RE | Admit: 2021-03-04 | Discharge: 2021-03-16 | DRG: 945 | Disposition: A | Discharge status: Home / Self Care | Payer: BC Managed Care – PPO | Source: Other Acute Inpatient Hospital | Attending: Physical Medicine and Rehabilitation | Admitting: Physical Medicine and Rehabilitation

## 2021-03-04 ENCOUNTER — Other Ambulatory Visit: Payer: Self-pay

## 2021-03-04 DIAGNOSIS — Z79899 Other long term (current) drug therapy: Secondary | ICD-10-CM

## 2021-03-04 DIAGNOSIS — Z86711 Personal history of pulmonary embolism: Secondary | ICD-10-CM | POA: Diagnosis not present

## 2021-03-04 DIAGNOSIS — K5900 Constipation, unspecified: Secondary | ICD-10-CM | POA: Diagnosis present

## 2021-03-04 DIAGNOSIS — Z992 Dependence on renal dialysis: Secondary | ICD-10-CM

## 2021-03-04 DIAGNOSIS — J9621 Acute and chronic respiratory failure with hypoxia: Secondary | ICD-10-CM | POA: Diagnosis not present

## 2021-03-04 DIAGNOSIS — E039 Hypothyroidism, unspecified: Secondary | ICD-10-CM | POA: Diagnosis present

## 2021-03-04 DIAGNOSIS — G4700 Insomnia, unspecified: Secondary | ICD-10-CM | POA: Diagnosis present

## 2021-03-04 DIAGNOSIS — N2581 Secondary hyperparathyroidism of renal origin: Secondary | ICD-10-CM | POA: Diagnosis not present

## 2021-03-04 DIAGNOSIS — Z923 Personal history of irradiation: Secondary | ICD-10-CM

## 2021-03-04 DIAGNOSIS — E1122 Type 2 diabetes mellitus with diabetic chronic kidney disease: Secondary | ICD-10-CM | POA: Diagnosis present

## 2021-03-04 DIAGNOSIS — M79671 Pain in right foot: Secondary | ICD-10-CM | POA: Diagnosis present

## 2021-03-04 DIAGNOSIS — L304 Erythema intertrigo: Secondary | ICD-10-CM | POA: Diagnosis not present

## 2021-03-04 DIAGNOSIS — Z7901 Long term (current) use of anticoagulants: Secondary | ICD-10-CM | POA: Diagnosis not present

## 2021-03-04 DIAGNOSIS — I2782 Chronic pulmonary embolism: Secondary | ICD-10-CM

## 2021-03-04 DIAGNOSIS — N170 Acute kidney failure with tubular necrosis: Secondary | ICD-10-CM

## 2021-03-04 DIAGNOSIS — I4819 Other persistent atrial fibrillation: Secondary | ICD-10-CM | POA: Diagnosis present

## 2021-03-04 DIAGNOSIS — I132 Hypertensive heart and chronic kidney disease with heart failure and with stage 5 chronic kidney disease, or end stage renal disease: Secondary | ICD-10-CM | POA: Diagnosis present

## 2021-03-04 DIAGNOSIS — Z9981 Dependence on supplemental oxygen: Secondary | ICD-10-CM | POA: Diagnosis not present

## 2021-03-04 DIAGNOSIS — Z87442 Personal history of urinary calculi: Secondary | ICD-10-CM | POA: Diagnosis not present

## 2021-03-04 DIAGNOSIS — Z820 Family history of epilepsy and other diseases of the nervous system: Secondary | ICD-10-CM

## 2021-03-04 DIAGNOSIS — N184 Chronic kidney disease, stage 4 (severe): Secondary | ICD-10-CM

## 2021-03-04 DIAGNOSIS — N186 End stage renal disease: Secondary | ICD-10-CM | POA: Diagnosis not present

## 2021-03-04 DIAGNOSIS — N179 Acute kidney failure, unspecified: Secondary | ICD-10-CM | POA: Diagnosis not present

## 2021-03-04 DIAGNOSIS — Z8249 Family history of ischemic heart disease and other diseases of the circulatory system: Secondary | ICD-10-CM

## 2021-03-04 DIAGNOSIS — Z6841 Body Mass Index (BMI) 40.0 and over, adult: Secondary | ICD-10-CM

## 2021-03-04 DIAGNOSIS — I5033 Acute on chronic diastolic (congestive) heart failure: Secondary | ICD-10-CM | POA: Diagnosis not present

## 2021-03-04 DIAGNOSIS — E785 Hyperlipidemia, unspecified: Secondary | ICD-10-CM | POA: Diagnosis present

## 2021-03-04 DIAGNOSIS — Z833 Family history of diabetes mellitus: Secondary | ICD-10-CM

## 2021-03-04 DIAGNOSIS — J069 Acute upper respiratory infection, unspecified: Secondary | ICD-10-CM | POA: Diagnosis not present

## 2021-03-04 DIAGNOSIS — Z800 Family history of malignant neoplasm of digestive organs: Secondary | ICD-10-CM

## 2021-03-04 DIAGNOSIS — Z8261 Family history of arthritis: Secondary | ICD-10-CM

## 2021-03-04 DIAGNOSIS — D631 Anemia in chronic kidney disease: Secondary | ICD-10-CM | POA: Diagnosis present

## 2021-03-04 DIAGNOSIS — I5032 Chronic diastolic (congestive) heart failure: Secondary | ICD-10-CM | POA: Diagnosis not present

## 2021-03-04 DIAGNOSIS — R5381 Other malaise: Principal | ICD-10-CM | POA: Diagnosis present

## 2021-03-04 DIAGNOSIS — J9611 Chronic respiratory failure with hypoxia: Secondary | ICD-10-CM | POA: Diagnosis present

## 2021-03-04 DIAGNOSIS — Z7985 Long-term (current) use of injectable non-insulin antidiabetic drugs: Secondary | ICD-10-CM

## 2021-03-04 DIAGNOSIS — Z79890 Hormone replacement therapy (postmenopausal): Secondary | ICD-10-CM

## 2021-03-04 DIAGNOSIS — Z853 Personal history of malignant neoplasm of breast: Secondary | ICD-10-CM | POA: Diagnosis not present

## 2021-03-04 DIAGNOSIS — I129 Hypertensive chronic kidney disease with stage 1 through stage 4 chronic kidney disease, or unspecified chronic kidney disease: Secondary | ICD-10-CM | POA: Diagnosis not present

## 2021-03-04 LAB — CBC WITH DIFFERENTIAL/PLATELET
Abs Immature Granulocytes: 0.17 K/uL — ABNORMAL HIGH (ref 0.00–0.07)
Basophils Absolute: 0 K/uL (ref 0.0–0.1)
Basophils Relative: 0 %
Eosinophils Absolute: 0.5 K/uL (ref 0.0–0.5)
Eosinophils Relative: 4 %
HCT: 23.5 % — ABNORMAL LOW (ref 36.0–46.0)
Hemoglobin: 6.9 g/dL — ABNORMAL LOW (ref 12.0–15.0)
Immature Granulocytes: 1 %
Lymphocytes Relative: 12 %
Lymphs Abs: 1.5 K/uL (ref 0.7–4.0)
MCH: 27.5 pg (ref 26.0–34.0)
MCHC: 29.4 g/dL — ABNORMAL LOW (ref 30.0–36.0)
MCV: 93.6 fL (ref 80.0–100.0)
Monocytes Absolute: 1.6 K/uL — ABNORMAL HIGH (ref 0.1–1.0)
Monocytes Relative: 13 %
Neutro Abs: 9.1 K/uL — ABNORMAL HIGH (ref 1.7–7.7)
Neutrophils Relative %: 70 %
Platelets: 252 K/uL (ref 150–400)
RBC: 2.51 MIL/uL — ABNORMAL LOW (ref 3.87–5.11)
RDW: 15.9 % — ABNORMAL HIGH (ref 11.5–15.5)
WBC: 12.8 K/uL — ABNORMAL HIGH (ref 4.0–10.5)
nRBC: 0.2 % (ref 0.0–0.2)

## 2021-03-04 LAB — BASIC METABOLIC PANEL WITH GFR
Anion gap: 10 (ref 5–15)
BUN: 58 mg/dL — ABNORMAL HIGH (ref 6–20)
CO2: 26 mmol/L (ref 22–32)
Calcium: 7.8 mg/dL — ABNORMAL LOW (ref 8.9–10.3)
Chloride: 99 mmol/L (ref 98–111)
Creatinine, Ser: 5 mg/dL — ABNORMAL HIGH (ref 0.44–1.00)
GFR, Estimated: 10 mL/min — ABNORMAL LOW
Glucose, Bld: 109 mg/dL — ABNORMAL HIGH (ref 70–99)
Potassium: 3.9 mmol/L (ref 3.5–5.1)
Sodium: 135 mmol/L (ref 135–145)

## 2021-03-04 LAB — GLUCOSE, CAPILLARY
Glucose-Capillary: 102 mg/dL — ABNORMAL HIGH (ref 70–99)
Glucose-Capillary: 126 mg/dL — ABNORMAL HIGH (ref 70–99)
Glucose-Capillary: 134 mg/dL — ABNORMAL HIGH (ref 70–99)
Glucose-Capillary: 192 mg/dL — ABNORMAL HIGH (ref 70–99)

## 2021-03-04 LAB — PREPARE RBC (CROSSMATCH)

## 2021-03-04 MED ORDER — ONDANSETRON HCL 4 MG PO TABS: 4.0000 mg | ORAL_TABLET | Freq: Four times a day (QID) | ORAL | Status: DC | PRN

## 2021-03-04 MED ORDER — FOLIC ACID 1 MG PO TABS
1000.0000 ug | ORAL_TABLET | Freq: Every day | ORAL | Status: DC
Start: 2021-03-05 — End: 2021-03-16
  Administered 2021-03-05 – 2021-03-16 (×12): 1 mg via ORAL
  Filled 2021-03-04 (×14): qty 1

## 2021-03-04 MED ORDER — APIXABAN 5 MG PO TABS
5.0000 mg | ORAL_TABLET | Freq: Two times a day (BID) | ORAL | Status: DC
  Administered 2021-03-04 – 2021-03-16 (×24): 5 mg via ORAL
  Filled 2021-03-04 (×25): qty 1

## 2021-03-04 MED ORDER — TRAZODONE HCL 50 MG PO TABS
25.0000 mg | ORAL_TABLET | Freq: Every evening | ORAL | Status: DC | PRN
  Administered 2021-03-04 – 2021-03-09 (×6): 25 mg via ORAL
  Filled 2021-03-04 (×6): qty 1

## 2021-03-04 MED ORDER — ALBUTEROL SULFATE (2.5 MG/3ML) 0.083% IN NEBU: 2.5000 mg | INHALATION_SOLUTION | Freq: Four times a day (QID) | RESPIRATORY_TRACT | Status: DC | PRN

## 2021-03-04 MED ORDER — LEVOTHYROXINE SODIUM 112 MCG PO TABS
112.0000 ug | ORAL_TABLET | Freq: Every day | ORAL | Status: DC
Start: 2021-03-05 — End: 2021-03-16
  Administered 2021-03-05 – 2021-03-16 (×12): 112 ug via ORAL
  Filled 2021-03-04 (×12): qty 1

## 2021-03-04 MED ORDER — CALCITRIOL 0.25 MCG PO CAPS
0.2500 ug | ORAL_CAPSULE | Freq: Every day | ORAL | Status: DC
  Administered 2021-03-05 – 2021-03-16 (×12): 0.25 ug via ORAL
  Filled 2021-03-04 (×12): qty 1

## 2021-03-04 MED ORDER — AMLODIPINE BESYLATE 5 MG PO TABS
5.0000 mg | ORAL_TABLET | Freq: Two times a day (BID) | ORAL | Status: DC
  Administered 2021-03-04 – 2021-03-16 (×22): 5 mg via ORAL
  Filled 2021-03-04 (×24): qty 1

## 2021-03-04 MED ORDER — METOPROLOL TARTRATE 50 MG PO TABS
100.0000 mg | ORAL_TABLET | Freq: Two times a day (BID) | ORAL | Status: DC
  Administered 2021-03-04 – 2021-03-16 (×22): 100 mg via ORAL
  Filled 2021-03-04 (×23): qty 2

## 2021-03-04 MED ORDER — ACETAMINOPHEN 650 MG RE SUPP: 650.0000 mg | Freq: Four times a day (QID) | RECTAL | Status: DC | PRN

## 2021-03-04 MED ORDER — ATORVASTATIN CALCIUM 10 MG PO TABS
20.0000 mg | ORAL_TABLET | Freq: Every day | ORAL | Status: DC
  Administered 2021-03-04 – 2021-03-15 (×12): 20 mg via ORAL
  Filled 2021-03-04 (×12): qty 2

## 2021-03-04 MED ORDER — SODIUM CHLORIDE 0.9% IV SOLUTION: Freq: Once | INTRAVENOUS | Status: AC

## 2021-03-04 MED ORDER — ONDANSETRON HCL 4 MG/2ML IJ SOLN: 4.0000 mg | Freq: Four times a day (QID) | INTRAMUSCULAR | Status: DC | PRN

## 2021-03-04 MED ORDER — BISACODYL 10 MG RE SUPP
10.0000 mg | Freq: Once | RECTAL | Status: DC
Start: 2021-03-04 — End: 2021-03-04

## 2021-03-04 MED ORDER — KIDNEY FAILURE BOOK: Freq: Once | Status: AC

## 2021-03-04 MED ORDER — CLONIDINE HCL 0.2 MG PO TABS
0.2000 mg | ORAL_TABLET | Freq: Two times a day (BID) | ORAL | Status: DC
  Administered 2021-03-04 – 2021-03-16 (×22): 0.2 mg via ORAL
  Filled 2021-03-04 (×23): qty 1

## 2021-03-04 MED ORDER — BLOOD PRESSURE CONTROL BOOK
Freq: Once | Status: AC
  Filled 2021-03-04: qty 1

## 2021-03-04 MED ORDER — DARBEPOETIN ALFA 25 MCG/0.42ML IJ SOSY
25.0000 ug | PREFILLED_SYRINGE | INTRAMUSCULAR | Status: DC
  Administered 2021-03-07 – 2021-03-09 (×2): 25 ug via INTRAVENOUS
  Filled 2021-03-04 (×3): qty 0.42

## 2021-03-04 MED ORDER — MELATONIN 5 MG PO TABS
10.0000 mg | ORAL_TABLET | Freq: Every evening | ORAL | Status: DC | PRN
  Administered 2021-03-04 – 2021-03-16 (×7): 10 mg via ORAL
  Filled 2021-03-04 (×7): qty 2

## 2021-03-04 MED ORDER — BISACODYL 10 MG RE SUPP
10.0000 mg | Freq: Every day | RECTAL | Status: DC | PRN
  Filled 2021-03-04: qty 1

## 2021-03-04 MED ORDER — HYDROMORPHONE HCL 1 MG/ML IJ SOLN: 0.5000 mg | INTRAMUSCULAR | Status: DC | PRN

## 2021-03-04 MED ORDER — HYDROCERIN EX CREA
TOPICAL_CREAM | Freq: Two times a day (BID) | CUTANEOUS | Status: DC
  Filled 2021-03-04 (×2): qty 113

## 2021-03-04 MED ORDER — VITAMIN D (ERGOCALCIFEROL) 1.25 MG (50000 UNIT) PO CAPS
50000.0000 [IU] | ORAL_CAPSULE | ORAL | Status: DC
Start: 2021-03-11 — End: 2021-03-16
  Administered 2021-03-11: 50000 [IU] via ORAL
  Filled 2021-03-04: qty 1

## 2021-03-04 MED ORDER — CALCIUM CARBONATE 1250 (500 CA) MG PO TABS
500.0000 mg | ORAL_TABLET | Freq: Every day | ORAL | Status: DC
  Administered 2021-03-05 – 2021-03-16 (×12): 500 mg via ORAL
  Filled 2021-03-04 (×14): qty 1

## 2021-03-04 MED ORDER — EXERCISE FOR HEART AND HEALTH BOOK
Freq: Once | Status: AC
  Filled 2021-03-04: qty 1

## 2021-03-04 MED ORDER — ACETAMINOPHEN 325 MG PO TABS
650.0000 mg | ORAL_TABLET | Freq: Four times a day (QID) | ORAL | Status: DC | PRN
  Administered 2021-03-05 – 2021-03-15 (×7): 650 mg via ORAL
  Filled 2021-03-04 (×8): qty 2

## 2021-03-04 MED ORDER — INSULIN ASPART 100 UNIT/ML IJ SOLN
0.0000 [IU] | Freq: Three times a day (TID) | INTRAMUSCULAR | Status: DC
  Administered 2021-03-04 – 2021-03-05 (×2): 2 [IU] via SUBCUTANEOUS
  Administered 2021-03-05: 3 [IU] via SUBCUTANEOUS
  Administered 2021-03-06 – 2021-03-07 (×3): 1 [IU] via SUBCUTANEOUS
  Administered 2021-03-08 – 2021-03-10 (×3): 2 [IU] via SUBCUTANEOUS
  Administered 2021-03-10: 1 [IU] via SUBCUTANEOUS
  Administered 2021-03-11: 2 [IU] via SUBCUTANEOUS
  Administered 2021-03-11: 1 [IU] via SUBCUTANEOUS
  Administered 2021-03-12: 2 [IU] via SUBCUTANEOUS
  Administered 2021-03-13 (×2): 1 [IU] via SUBCUTANEOUS
  Administered 2021-03-13: 2 [IU] via SUBCUTANEOUS
  Administered 2021-03-14 – 2021-03-15 (×4): 1 [IU] via SUBCUTANEOUS
  Administered 2021-03-15: 2 [IU] via SUBCUTANEOUS
  Administered 2021-03-15: 1 [IU] via SUBCUTANEOUS

## 2021-03-04 NOTE — Discharge Summary (Signed)
Physician Discharge Summary  Nicole Schmidt ZOX:[id number] DOB: 04/14/1965 DOA: 02/23/2021  PCP: Lenard Simmer, MD  Admit date: 02/23/2021 Discharge date: 03/04/2021  Discharge disposition: Inpatient rehab facility   Recommendations for Outpatient Follow-Up:   Follow-up with PCP for routine health maintenance after discharge from the inpatient rehab facility. Continue hemodialysis at outpatient dialysis center on Tuesdays, Thursdays and Saturdays   Discharge Diagnosis:   Principal Problem:   Acute on chronic renal failure (HCC) Active Problems:   Acute renal failure superimposed on stage 4 chronic kidney disease (HCC)   Acute on chronic diastolic CHF (congestive heart failure) (HCC)   Obstructive sleep apnea   Morbid obesity with BMI of 60.0-69.9, adult ([place])   Diabetes mellitus type 2 in obese ([place])   Acute on chronic respiratory failure with hypoxemia (HCC)   AF (paroxysmal atrial fibrillation) (HCC)   Chronic respiratory failure with hypoxia ([place])   ESRD (end stage renal disease) ([place])    Discharge Condition: Stable.  Diet recommendation:  Diet Order             Diet renal 60/70-03-17-1198           Diet Carb Modified           Diet renal/carb modified with fluid restriction Diet-HS Snack? Nothing; Fluid restriction: 1200 mL Fluid; Room service appropriate? Yes; Fluid consistency: Thin  Diet effective now                     Code Status: Full Code     Hospital Course:   [name] is a 56 y.o. female with medical history significant for type II DM, hypertension, dyslipidemia, hypothyroidism, gout, chronic diastolic CHF, chronic hypoxemic respiratory failure on 2 L oxygen, atrial fibrillation and PE on Eliquis, recent hospitalization from 02/01/2021 through 02/22/2021 for CHF exacerbation and AKI.  She presented to the hospital because of worsening lower extremity edema and shortness of breath.   She was admitted for acute on chronic CHF, acute  on chronic hypoxemic respiratory failure and progression of CKD stage IV to ESRD.  Permacath was placed on 02/25/2021 and hemodialysis was initiated for ESRD and fluid overload.  She has anemia of chronic disease and her hemoglobin dropped from 8.8-6.9.  She was transfused with 1 unit of packed red blood cells for acute on chronic anemia because of generalized weakness and easy fatigability.  She was evaluated by PT and OT and physiatrist.  She was deemed to be a candidate for discharge to the inpatient rehab.  Her condition has improved and she is stable for discharge today.      Medical Consultants:   Vascular surgeon Nephrologist   Discharge Exam:    Vitals:   03/04/21 0347 03/04/21 0808 03/04/21 1200 03/04/21 1233  BP: 138/60 134/62 (!) 135/58 (!) 126/40  Pulse: 84 75 89 79  Resp: 20 20 20 20   Temp: 98 F (36.7 C) 98.1 F (36.7 C) 98.6 F (37 C) 98.6 F (37 C)  TempSrc: Oral Oral Oral Oral  SpO2: 96% 97% 97% 99%  Weight:      Height:         GEN: NAD SKIN: Warm and dry EYES: EOMI ENT: MMM CV: RRR PULM: CTA B ABD: soft, obese/distended, NT, +BS CNS: AAO x 3, non focal EXT: B/l leg edema, no  tenderness   The results of significant diagnostics from this hospitalization (including imaging, microbiology, ancillary and laboratory) are listed below for reference.  Procedures and Diagnostic Studies:   DG Chest 2 View  Result Date: 02/23/2021 CLINICAL DATA:  Shortness of breath EXAM: CHEST - 2 VIEW COMPARISON:  Previous studies including the examination of 02/08/2021 FINDINGS: Transverse diameter of heart is increased. There is subtle prominence of interstitial markings in the parahilar regions. There is interval decrease in patchy linear densities in the parahilar regions and lower lung fields. There is no new focal pulmonary consolidation. Lateral CP angles are essentially clear. There is no pneumothorax. IMPRESSION: Cardiomegaly. Central pulmonary vessels are  prominent. There is subtle prominence of interstitial markings in the parahilar regions, more so on the right side, possibly suggesting mild asymmetric interstitial pulmonary edema. There is interval decrease in patchy infiltrates in the parahilar regions and lower lung fields suggesting resolving atelectasis/pneumonia. Electronically Signed   By: Elmer Picker M.D.   On: 02/23/2021 18:13   PERIPHERAL VASCULAR CATHETERIZATION  Result Date: 02/24/2021 See surgical note for result.    Labs:   Basic Metabolic Panel: Recent Labs  Lab 02/26/21 0519 02/28/21 0549 03/01/21 0719 03/03/21 1023 03/04/21 0746  NA 138 136 135 133* 135  K 5.1 4.2 4.4 3.3* 3.9  CL 101 101 98 97* 99  CO2 25 27 24 27 26   GLUCOSE 134* 177* 129* 155* 109*  BUN 122* 103* 62* 44* 58*  CREATININE 6.03* 6.13* 4.39* 3.98* 5.00*  CALCIUM 8.7* 8.4* 8.3* 7.6* 7.8*  PHOS  --   --  4.4 4.7*  --    GFR Estimated Creatinine Clearance: 19.3 mL/min (A) (by C-G formula based on SCr of 5 mg/dL (H)). Liver Function Tests: Recent Labs  Lab 03/01/21 0719 03/03/21 1023  ALBUMIN 2.7* 2.6*   No results for input(s): LIPASE, AMYLASE in the last 168 hours. No results for input(s): AMMONIA in the last 168 hours. Coagulation profile No results for input(s): INR, PROTIME in the last 168 hours.  CBC: Recent Labs  Lab 02/26/21 0519 02/28/21 0549 03/01/21 0719 03/03/21 1023 03/04/21 0746  WBC 12.2* 12.0* 12.4* 14.2* 12.8*  NEUTROABS 9.3*  --  9.2*  --  9.1*  HGB 8.0* 7.9* 9.3* 7.4* 6.9*  HCT 27.5* 26.2* 30.6* 24.6* 23.5*  MCV 94.2 90.7 89.5 92.8 93.6  PLT 244 265 227 261 252   Cardiac Enzymes: No results for input(s): CKTOTAL, CKMB, CKMBINDEX, TROPONINI in the last 168 hours. BNP: Invalid input(s): POCBNP CBG: Recent Labs  Lab 03/03/21 1651 03/03/21 2052 03/04/21 0812 03/04/21 0902 03/04/21 1143  GLUCAP 123* 164* 102* 126* 134*   D-Dimer No results for input(s): DDIMER in the last 72 hours. Hgb A1c No  results for input(s): HGBA1C in the last 72 hours. Lipid Profile No results for input(s): CHOL, HDL, LDLCALC, TRIG, CHOLHDL, LDLDIRECT in the last 72 hours. Thyroid function studies No results for input(s): TSH, T4TOTAL, T3FREE, THYROIDAB in the last 72 hours.  Invalid input(s): FREET3 Anemia work up No results for input(s): VITAMINB12, FOLATE, FERRITIN, TIBC, IRON, RETICCTPCT in the last 72 hours. Microbiology Recent Results (from the past 240 hour(s))  Resp Panel by RT-PCR (Flu A&B, Covid) Nasopharyngeal Swab     Status: None   Collection Time: 02/23/21  9:29 PM   Specimen: Nasopharyngeal Swab; Nasopharyngeal(NP) swabs in vial transport medium  Result Value Ref Range Status   SARS Coronavirus 2 by RT PCR NEGATIVE NEGATIVE Final    Comment: (NOTE) SARS-CoV-2 target nucleic acids are NOT DETECTED.  The SARS-CoV-2 RNA is generally detectable in upper respiratory specimens during the acute phase of infection.  The lowest concentration of SARS-CoV-2 viral copies this assay can detect is 138 copies/mL. A negative result does not preclude SARS-Cov-2 infection and should not be used as the sole basis for treatment or other patient management decisions. A negative result may occur with  improper specimen collection/handling, submission of specimen other than nasopharyngeal swab, presence of viral mutation(s) within the areas targeted by this assay, and inadequate number of viral copies(<138 copies/mL). A negative result must be combined with clinical observations, patient history, and epidemiological information. The expected result is Negative.  Fact Sheet for Patients:  EntrepreneurPulse.com.au  Fact Sheet for Healthcare Providers:  IncredibleEmployment.be  This test is no t yet approved or cleared by the [place] FDA and  has been authorized for detection and/or diagnosis of SARS-CoV-2 by FDA under an Emergency Use Authorization (EUA). This  EUA will remain  in effect (meaning this test can be used) for the duration of the COVID-19 declaration under Section 564(b)(1) of the Act, 21 U.S.C.section 360bbb-3(b)(1), unless the authorization is terminated  or revoked sooner.       Influenza A by PCR NEGATIVE NEGATIVE Final   Influenza B by PCR NEGATIVE NEGATIVE Final    Comment: (NOTE) The Xpert Xpress SARS-CoV-2/FLU/RSV plus assay is intended as an aid in the diagnosis of influenza from Nasopharyngeal swab specimens and should not be used as a sole basis for treatment. Nasal washings and aspirates are unacceptable for Xpert Xpress SARS-CoV-2/FLU/RSV testing.  Fact Sheet for Patients: EntrepreneurPulse.com.au  Fact Sheet for Healthcare Providers: IncredibleEmployment.be  This test is not yet approved or cleared by the [place] FDA and has been authorized for detection and/or diagnosis of SARS-CoV-2 by FDA under an Emergency Use Authorization (EUA). This EUA will remain in effect (meaning this test can be used) for the duration of the COVID-19 declaration under Section 564(b)(1) of the Act, 21 U.S.C. section 360bbb-3(b)(1), unless the authorization is terminated or revoked.  Performed at [facility], [address], [place], [place] [address]      Discharge Instructions:   Discharge Instructions     Diet Carb Modified   Complete by: As directed    Diet renal 60/70-03-17-1198   Complete by: As directed    Increase activity slowly   Complete by: As directed       Allergies as of 03/04/2021   No Known Allergies      Medication List     STOP taking these medications    furosemide 40 MG tablet Commonly known as: LASIX       TAKE these medications    acetaminophen 500 MG tablet Commonly known as: TYLENOL Take 1,000 mg by mouth every 6 (six) hours as needed (for pain).   albuterol 108 (90 Base) MCG/ACT inhaler Commonly known as: VENTOLIN  HFA Inhale 2 puffs into the lungs every 6 (six) hours as needed for wheezing or shortness of breath.   amLODipine 5 MG tablet Commonly known as: NORVASC Take 5 mg by mouth 2 (two) times daily.   atorvastatin 20 MG tablet Commonly known as: LIPITOR Take 20 mg by mouth at bedtime.   calcitRIOL 0.25 MCG capsule Commonly known as: ROCALTROL Take 0.25 mcg by mouth daily.   calcium carbonate 1500 (600 Ca) MG Tabs tablet Commonly known as: OSCAL Take 600 mg of elemental calcium by mouth daily.   cloNIDine 0.2 MG tablet Commonly known as: CATAPRES Take 1 tablet (0.2 mg total) by mouth 2 (two) times daily.   Dulaglutide 1.5 MG/0.5ML Sopn  Inject 1.5 mg into the skin every 7 (seven) days.   Eliquis 5 MG Tabs tablet Generic drug: apixaban TAKE 1 TABLET TWICE A DAY   folic acid 338 MCG tablet Commonly known as: FOLVITE Take 800 mcg by mouth daily.   levothyroxine 112 MCG tablet Commonly known as: SYNTHROID Take 112 mcg daily before breakfast by mouth.   Melatonin 10 MG Tabs Take 10 mg by mouth at bedtime.   metoprolol tartrate 100 MG tablet Commonly known as: LOPRESSOR Take 1 tablet (100 mg total) by mouth 2 (two) times daily.   Vitamin D3 1.25 MG (50000 UT) Tabs Take 50,000 Units by mouth every Friday.           If you experience worsening of your admission symptoms, develop shortness of breath, life threatening emergency, suicidal or homicidal thoughts you must seek medical attention immediately by calling 911 or calling your MD immediately  if symptoms less severe.   You must read complete instructions/literature along with all the possible adverse reactions/side effects for all the medicines you take and that have been prescribed to you. Take any new medicines after you have completely understood and accept all the possible adverse reactions/side effects.    Please note   You were cared for by a hospitalist during your hospital stay. If you have any questions about  your discharge medications or the care you received while you were in the hospital after you are discharged, you can call the unit and asked to speak with the hospitalist on call if the hospitalist that took care of you is not available. Once you are discharged, your primary care physician will handle any further medical issues. Please note that NO REFILLS for any discharge medications will be authorized once you are discharged, as it is imperative that you return to your primary care physician (or establish a relationship with a primary care physician if you do not have one) for your aftercare needs so that they can reassess your need for medications and monitor your lab values.       Time coordinating discharge: 34 minutes  Signed:  Omesha Bowerman  [facility] 03/04/2021, 1:28 PM   Pager on [email or web address]. If 7PM-7AM, please contact night-coverage at [email or web address]

## 2021-03-04 NOTE — Progress Notes (Signed)
[facility]  ROUNDING NOTE   Subjective:   Patient states she feels well today Able to ambulate to the hall yesterday with assistance and a couple rest breaks. Tolerates meals without nausea and vomiting Reports improved dyspnea with exertion, weaned to 2L   UOP 552ml in past 24 hours Creatinine remains 5   Objective:  Vital signs in last 24 hours:  Temp:  [98 F (36.7 C)-98.2 F (36.8 C)] 98.1 F (36.7 C) (01/20 0808) Pulse Rate:  [72-84] 75 (01/20 0808) Resp:  [18-20] 20 (01/20 0808) BP: (95-138)/(43-62) 134/62 (01/20 0808) SpO2:  [96 %-97 %] 97 % (01/20 0808)  Weight change:  Filed Weights   03/01/21 0500 03/02/21 1024  Weight: (!) 162.6 kg (!) 164.3 kg     Intake/Output: I/O last 3 completed shifts: In: 1200 [P.O.:1200] Out: 700 [Urine:700]   Intake/Output this shift:  Total I/O In: 120 [P.O.:120] Out: 100 [Urine:100]  Physical Exam: General: NAD, Sitting in chair  Head: Normocephalic, atraumatic. Moist oral mucosal membranes  Eyes: Anicteric  Lungs:  Diminished bilaterally, 2L [place] O2  Heart: Irregular   Abdomen:  Soft, nontender, obese  Extremities:  1+ peripheral edema.  Neurologic: Nonfocal, moving all four extremities  Access:  RIJ permcath 9/35/70    Basic Metabolic Panel: Recent Labs  Lab 02/26/21 0519 02/28/21 0549 03/01/21 0719 03/03/21 1023 03/04/21 0746  NA 138 136 135 133* 135  K 5.1 4.2 4.4 3.3* 3.9  CL 101 101 98 97* 99  CO2 25 27 24 27 26   GLUCOSE 134* 177* 129* 155* 109*  BUN 122* 103* 62* 44* 58*  CREATININE 6.03* 6.13* 4.39* 3.98* 5.00*  CALCIUM 8.7* 8.4* 8.3* 7.6* 7.8*  PHOS  --   --  4.4 4.7*  --      Liver Function Tests: Recent Labs  Lab 03/01/21 0719 03/03/21 1023  ALBUMIN 2.7* 2.6*    No results for input(s): LIPASE, AMYLASE in the last 168 hours. No results for input(s): AMMONIA in the last 168 hours.  CBC: Recent Labs  Lab 02/26/21 0519 02/28/21 0549 03/01/21 0719 03/03/21 1023  03/04/21 0746  WBC 12.2* 12.0* 12.4* 14.2* 12.8*  NEUTROABS 9.3*  --  9.2*  --  9.1*  HGB 8.0* 7.9* 9.3* 7.4* 6.9*  HCT 27.5* 26.2* 30.6* 24.6* 23.5*  MCV 94.2 90.7 89.5 92.8 93.6  PLT 244 265 227 261 252     Cardiac Enzymes: No results for input(s): CKTOTAL, CKMB, CKMBINDEX, TROPONINI in the last 168 hours.  BNP: Invalid input(s): POCBNP  CBG: Recent Labs  Lab 03/03/21 1150 03/03/21 1651 03/03/21 2052 03/04/21 0812 03/04/21 0902  GLUCAP 134* 123* 164* 102* 126*     Microbiology: Results for orders placed or performed during the hospital encounter of 02/23/21  Resp Panel by RT-PCR (Flu A&B, Covid) Nasopharyngeal Swab     Status: None   Collection Time: 02/23/21  9:29 PM   Specimen: Nasopharyngeal Swab; Nasopharyngeal(NP) swabs in vial transport medium  Result Value Ref Range Status   SARS Coronavirus 2 by RT PCR NEGATIVE NEGATIVE Final    Comment: (NOTE) SARS-CoV-2 target nucleic acids are NOT DETECTED.  The SARS-CoV-2 RNA is generally detectable in upper respiratory specimens during the acute phase of infection. The lowest concentration of SARS-CoV-2 viral copies this assay can detect is 138 copies/mL. A negative result does not preclude SARS-Cov-2 infection and should not be used as the sole basis for treatment or other patient management decisions. A negative result may occur with  improper specimen collection/handling, submission of specimen other than nasopharyngeal swab, presence of viral mutation(s) within the areas targeted by this assay, and inadequate number of viral copies(<138 copies/mL). A negative result must be combined with clinical observations, patient history, and epidemiological information. The expected result is Negative.  Fact Sheet for Patients:  EntrepreneurPulse.com.au  Fact Sheet for Healthcare Providers:  IncredibleEmployment.be  This test is no t yet approved or cleared by the [place] FDA  and  has been authorized for detection and/or diagnosis of SARS-CoV-2 by FDA under an Emergency Use Authorization (EUA). This EUA will remain  in effect (meaning this test can be used) for the duration of the COVID-19 declaration under Section 564(b)(1) of the Act, 21 U.S.C.section 360bbb-3(b)(1), unless the authorization is terminated  or revoked sooner.       Influenza A by PCR NEGATIVE NEGATIVE Final   Influenza B by PCR NEGATIVE NEGATIVE Final    Comment: (NOTE) The Xpert Xpress SARS-CoV-2/FLU/RSV plus assay is intended as an aid in the diagnosis of influenza from Nasopharyngeal swab specimens and should not be used as a sole basis for treatment. Nasal washings and aspirates are unacceptable for Xpert Xpress SARS-CoV-2/FLU/RSV testing.  Fact Sheet for Patients: EntrepreneurPulse.com.au  Fact Sheet for Healthcare Providers: IncredibleEmployment.be  This test is not yet approved or cleared by the [place] FDA and has been authorized for detection and/or diagnosis of SARS-CoV-2 by FDA under an Emergency Use Authorization (EUA). This EUA will remain in effect (meaning this test can be used) for the duration of the COVID-19 declaration under Section 564(b)(1) of the Act, 21 U.S.C. section 360bbb-3(b)(1), unless the authorization is terminated or revoked.  Performed at [facility], [address] [place], [place] [address]     Coagulation Studies: No results for input(s): LABPROT, INR in the last 72 hours.   Urinalysis: No results for input(s): COLORURINE, LABSPEC, PHURINE, GLUCOSEU, HGBUR, BILIRUBINUR, KETONESUR, PROTEINUR, UROBILINOGEN, NITRITE, LEUKOCYTESUR in the last 72 hours.  Invalid input(s): APPERANCEUR      Imaging: No results found.   Medications:       sodium chloride   Intravenous Once   amLODipine  5 mg Oral BID   apixaban  5 mg Oral BID   atorvastatin  20 mg Oral QHS   bisacodyl  10 mg  Rectal Once   calcitRIOL  0.25 mcg Oral Daily   calcium carbonate  500 mg of elemental calcium Oral Q breakfast   Chlorhexidine Gluconate Cloth  6 each Topical Q0600   cloNIDine  0.2 mg Oral BID   epoetin (EPOGEN/PROCRIT) injection  10,000 Units Intravenous Q M,W,F-HD   folic acid  2,751 mcg Oral Daily   insulin aspart  0-9 Units Subcutaneous TID AC & HS   levothyroxine  112 mcg Oral QAC breakfast   metoprolol tartrate  100 mg Oral BID   Vitamin D (Ergocalciferol)  50,000 Units Oral Q Fri   acetaminophen **OR** acetaminophen, albuterol, HYDROmorphone (DILAUDID) injection, magnesium hydroxide, melatonin, ondansetron **OR** ondansetron (ZOFRAN) IV, traZODone  Assessment/ Plan:  [name] is a 56 y.o. white female hypertension, hyperlipidemia, diabetes mellitus type II, diastolic congestive heart failure, hypothyroidism, history of breast cancer, atrial fibrillation and pulmonary embolism who was admitted to [facility] on 02/23/2021 for Cardiorenal syndrome with renal failure [I13.10] Acute on chronic renal failure ([place]) [N17.9, N18.9] AKI (acute kidney injury) ([place]) [N17.9]  Acute Kidney Injury on chronic kidney disease stage IV: baseline creatinine of 2.43 on 01/10/21. Concerns remain that patient may  be progressing toward end stage renal disease requiring hemodialysis. Continuing to monitor recovery closely. Dialysis initiated on 02/25/21.   - Next treatment schedule for Saturday  -Appreciate dialysis coordinator arranging outpatient treatment at [facility] on TTS schedule.  Patient may receive rehab at [facility]. Will monitor and make adjustments to outpatient clinic as needed.   Anemia of chronic kidney disease: Iron levels consistent with iron deficiency. Hgb 6.9. 1 unit blood transfusion ordered by primary team. Will continue EPO with treatments.  Hypertension with chronic kidney disease: On amlodipine, clonidine and metoprolol. BP stable  Hematuria: Ureteral stent placed by urology  on 02/14/2021.  With recent episode of nephrolithiasis.  No further reports of hematuria reports  Secondary Hyperparathyroidism: PTH on 12/30/20 was 72. Currently without binders. Phosphorus 4.7 - Continue calcitriol and ergocalciferol.   Acute respiratory failure: increased oxygen requirements from her baseline of 2 liters. Secondary to pneumonia and pulmonary edema.  Weaned to 2L at rest.    LOS: 8 [place] 1/20/202311:08 AM

## 2021-03-04 NOTE — Progress Notes (Signed)
Patient to self administer CPAP using her home machine.  RT assisted patient with setup and filling the humidifier.  Patient is familiar with equipment and procedure.  2L o2 bleed in as per home regimen.

## 2021-03-04 NOTE — Progress Notes (Signed)
Inpatient Rehab Admissions Coordinator:    I have insurance approval and a bed available for pt to admit to CIR today. Awaiting final confirmation from Dr. Mal Misty that she's ready.  Will let pt/family and TOC team know.   Shann Medal, PT, DPT Admissions Coordinator [phone] 03/04/21  10:46 AM

## 2021-03-04 NOTE — Progress Notes (Signed)
Inpatient Rehabilitation Admission Medication Review by a Pharmacist  A complete drug regimen review was completed for this patient to identify any potential clinically significant medication issues.  High Risk Drug Classes Is patient taking? Indication by Medication  Antipsychotic No   Anticoagulant Yes Apixaban- A. Fib and PE  Antibiotic No   Opioid No   Antiplatelet No   Hypoglycemics/insulin Yes iSS- T2DM  Vasoactive Medication Yes Clonidine, norvasc, lopressor- CHF, hypertension  Chemotherapy No   Other Yes Lipitor- HLD Rocaltrol- secondary hyperparathyroidism in ESRD Aranesp- anemia     Type of Medication Issue Identified Description of Issue Recommendation(s)  Drug Interaction(s) (clinically significant)     Duplicate Therapy     Allergy     No Medication Administration End Date     Incorrect Dose     Additional Drug Therapy Needed     Significant med changes from prior encounter (inform family/care partners about these prior to discharge).    Other  PTA meds: Dulaglutide Epogen Restart PTA dulaglutide at discharge Discontinue Aranesp and restart Epogen at discharge    Clinically significant medication issues were identified that warrant physician communication and completion of prescribed/recommended actions by midnight of the next day:  No  Time spent performing this drug regimen review (minutes):  30   Endiya Klahr BS, PharmD, BCPS Clinical Pharmacist 03/04/2021 5:31 PM

## 2021-03-04 NOTE — H&P (Signed)
Physical Medicine and Rehabilitation Admission H&P  CC: Debility  HPI: Nicole Schmidt is a 56 year old right-handed female with history of chronic hypoxemic respiratory failure on 2 L oxygen, diabetes mellitus, breast cancer, history of hematuria left ureteral stone with recent left uteroscopy with laser lithotripsy and stent exchange ureteral stent per Dr.Sninsky, diabetes mellitus, hypertension, hyperlipidemia, gout, morbid obesity with BMI 62.17, atrial fibrillation/pulmonary emboli maintained on Eliquis.  Recent admission 02/01/2021 - 02/22/2021 for management of acute on chronic diastolic congestive heart failure and AKI superimposed on CKD stage III.  Per chart review patient lives alone.  1 level home 2 steps to entry.  Reports independent without assistive device prior to admission.  Presented to [facility] 02/23/2021 ER with increasing lower extremity edema and increasing shortness of breath and diminished urine output.  Chest x-ray showed cardiomegaly subtle prominence of interstitial markings in the perihilar regions more so on the right side possibly suggesting mild asymmetric interstitial pulmonary edema.  Noted interval decrease in patchy infiltrates in the perihilar regions and lower lung fields suggesting resolving atelectasis/pneumonia.  Admission chemistries glucose 128 BUN 136 creatinine 5.82, WBC 16,600 hemoglobin 8.8, troponin 10, BNP 375.  Follow-up nephrology services permacath was placed 02/25/2021 hemodialysis initiated for ESRD and fluid overload and latest creatinine 4.39.  She remains on chronic Eliquis for history of atrial fibrillation pulmonary emboli.  Therapy evaluations completed due to patient decreased functional mobility was admitted for a comprehensive rehab program.Currently complains of constipation.   Review of Systems  Constitutional:  Positive for malaise/fatigue. Negative for chills and fever.  HENT:  Negative for hearing loss.   Eyes:   Negative for blurred vision and double vision.  Respiratory:  Positive for shortness of breath. Negative for cough and wheezing.   Cardiovascular:  Positive for palpitations and leg swelling. Negative for chest pain.  Gastrointestinal:  Positive for constipation. Negative for heartburn, nausea and vomiting.  Genitourinary:  Positive for hematuria. Negative for dysuria and flank pain.  Musculoskeletal:  Positive for joint pain and myalgias.  Skin:  Negative for rash.  Neurological:  Positive for weakness.  All other systems reviewed and are negative. Past Medical History:  Diagnosis Date   (HFpEF) heart failure with preserved ejection fraction ([place])    a. 10/2018 Echo: EF 60-65%, diast dysfxn, RVSP 50.7 mmHg, mildly dil LA.   Acquired hypothyroidism 02/09/2020   Anemia    Arthritis    Breast cancer ([place]) 2014   right breast cancer   Breast cancer of upper-inner quadrant of right female breast ([place])    Right breast invasive CA and DCIS , 7 mm T1,N0,M0. Er/PR pos, her 2 negative.  Margins 1 mm.   CHF (congestive heart failure) (HCC)    CKD (chronic kidney disease), stage III (HCC)    Diabetes mellitus type 2 in obese ([place]) 02/09/2020   Diabetes mellitus without complication ([place])    Dyslipidemia 02/09/2020   Essential hypertension    Gout    Hypothyroidism    Menopause    age 42   Morbid obesity ([place])    Persistent atrial fibrillation ([place])    a. Dx 10/2018 in setting of PNA. CHA2DS2VASc = 4-->Eliquis; b. 11/2018 s/p successful DCCV.   Personal history of radiation therapy    Pulmonary embolism ([place]) 10/2014   a. Chronic eliquis.   Thyroid goiter    Past Surgical History:  Procedure Laterality Date   BREAST BIOPSY Right 2014   breast ca   BREAST EXCISIONAL BIOPSY Right  07/19/2012   breast ca   BREAST SURGERY Right 2014   with sentinel node bx subareaolar duct excision   CARDIOVERSION N/A 11/28/2018   Procedure: CARDIOVERSION;  Surgeon: Minna Merritts, MD;  Location: [facility]  ORS;  Service: Cardiovascular;  Laterality: N/A;   COLONOSCOPY WITH PROPOFOL N/A 01/30/2017   Procedure: COLONOSCOPY WITH PROPOFOL;  Surgeon: Christene Lye, MD;  Location: [facility] ENDOSCOPY;  Service: Endoscopy;  Laterality: N/A;   CYSTOSCOPY W/ URETERAL STENT PLACEMENT Left 02/14/2021   Procedure: CYSTOSCOPY WITH RETROGRADE PYELOGRAM/URETERAL STENT PLACEMENT;  Surgeon: Billey Co, MD;  Location: [facility] ORS;  Service: Urology;  Laterality: Left;   DIALYSIS/PERMA CATHETER INSERTION N/A 02/24/2021   Procedure: DIALYSIS/PERMA CATHETER INSERTION;  Surgeon: Algernon Huxley, MD;  Location: [facility] LAB;  Service: Cardiovascular;  Laterality: N/A;   Family History  Problem Relation Age of Onset   Diabetes Father    Hypertension Father    Parkinson's disease Father    Hypertension Mother    Rheum arthritis Mother    Atrial fibrillation Mother    Heart failure Mother    Heart attack Mother        died in her mid-70's.   Colon cancer Cousin 27   Breast cancer Neg Hx    Social History:  reports that she has never smoked. She has never used smokeless tobacco. She reports that she does not drink alcohol and does not use drugs. Allergies: No Known Allergies Medications Prior to Admission  Medication Sig Dispense Refill   acetaminophen (TYLENOL) 500 MG tablet Take 1,000 mg by mouth every 6 (six) hours as needed (for pain).     albuterol (VENTOLIN HFA) 108 (90 Base) MCG/ACT inhaler Inhale 2 puffs into the lungs every 6 (six) hours as needed for wheezing or shortness of breath.     amLODipine (NORVASC) 5 MG tablet Take 5 mg by mouth 2 (two) times daily.     atorvastatin (LIPITOR) 20 MG tablet Take 20 mg by mouth at bedtime.      calcitRIOL (ROCALTROL) 0.25 MCG capsule Take 0.25 mcg by mouth daily.     calcium carbonate (OSCAL) 1500 (600 Ca) MG TABS tablet Take 600 mg of elemental calcium by mouth daily.      Cholecalciferol (VITAMIN D3) 50000 units TABS Take 50,000 Units by mouth every  Friday.      cloNIDine (CATAPRES) 0.2 MG tablet Take 1 tablet (0.2 mg total) by mouth 2 (two) times daily. 60 tablet 0   Dulaglutide 1.5 MG/0.5ML SOPN Inject 1.5 mg into the skin every 7 (seven) days.     ELIQUIS 5 MG TABS tablet TAKE 1 TABLET TWICE A DAY 672 tablet 3   folic acid (FOLVITE) 094 MCG tablet Take 800 mcg by mouth daily.  (Patient not taking: Reported on 02/24/2021)     levothyroxine (SYNTHROID, LEVOTHROID) 112 MCG tablet Take 112 mcg daily before breakfast by mouth.     Melatonin 10 MG TABS Take 10 mg by mouth at bedtime.     metoprolol tartrate (LOPRESSOR) 100 MG tablet Take 1 tablet (100 mg total) by mouth 2 (two) times daily. 60 tablet 0    Drug Regimen Review Drug regimen was reviewed and remains appropriate with no significant issues identified  Home: Home Living Family/patient expects to be discharged to:: Private residence Living Arrangements: Alone Available Help at Discharge: Family, Friend(s), Available PRN/intermittently Type of Home: House Home Access: Stairs to enter [employer] of Steps: 2 in front and 4 in  back Entrance Stairs-Rails: Right, Left Home Layout: One level Bathroom Shower/Tub: Multimedia programmer: Handicapped height Bathroom Accessibility: Yes Home Equipment: [place] - single point, Rollator (4 wheels), Conservation officer, nature (2 wheels), BSC/3in1, Wheelchair - manual, Shower seat Additional Comments: Pt currently lives in her mother's old home that is 1 level with level entry, but planning to move to a new house as soon as possible that's 1 level with 4 steps to enter with B rails. She does not want to return to mother's home as she believe it has mold.   Functional History: Prior Function Prior Level of Function : Independent/Modified Independent Mobility Comments: Pt reports independence without AD, no falls, driving. ADLs Comments: Pt reports independence in self care and IADL tasks   Functional Status:  Mobility: Bed  Mobility General bed mobility comments: not performed as pt received in chair Transfers Overall transfer level: Needs assistance Equipment used: Rolling walker (2 wheels) Transfers: Sit to/from Stand Sit to Stand: Supervision General transfer comment: safe technique. Still requires heavy B UE assistance in order to stand. Once standing, upright posture noted Ambulation/Gait Ambulation/Gait assistance: Supervision Gait Distance (Feet): 55 Feet Assistive device: Rolling walker (2 wheels) Gait Pattern/deviations: Step-through pattern General Gait Details: ambulated with step to gait pattern. 1 standing rest break required. Heavy WBing through B UE due to R foot pain. Pt on 2L of O2 with exertion decreasing to 86% with ambulation. Gait velocity: decreased   ADL: ADL Overall ADL's : Needs assistance/impaired Toilet Transfer: Minimal assistance, BSC/3in1, Rolling walker (2 wheels) Toilet Transfer Details (indicate cue type and reason): simulated Toileting- Clothing Manipulation and Hygiene: Moderate assistance, Sit to/from stand Functional mobility during ADLs: Minimal assistance, Rolling walker (2 wheels)   Cognition: Cognition Overall Cognitive Status: Within Functional Limits for tasks assessed Orientation Level: Oriented X4 Cognition Arousal/Alertness: Awake/alert Behavior During Therapy: WFL for tasks assessed/performed Overall Cognitive Status: Within Functional Limits for tasks assessed General Comments: very motivated to participate, however frustrated with pain levels  Physical Exam: Height 5\' 4"  (1.626 m), weight (!) 164 kg, last menstrual period 07/17/2012. Gen: no distress, normal appearing HEENT: oral mucosa pink and moist, nasal cannula in place Cardio: Irregular Chest: normal effort, normal rate of breathing, diminished breath sounds bilaterally Abd: soft, non-distended Ext: 1+ bilateral lower extremity edema Psych: pleasant, normal affect Skin: RIJ permacath  C/D/I, dry and scaly skin on bilateral feet. Neurological:     Comments: Patient is alert.  No acute distress.  Mood is a bit flat but appropriate.  Follows commands.  Oriented x3. Allodynia over lateral aspect of right foot. Decreased sensation bilateral feet  Results for orders placed or performed during the hospital encounter of 02/23/21 (from the past 48 hour(s))  Glucose, capillary     Status: Abnormal   Collection Time: 03/02/21  8:14 PM  Result Value Ref Range   Glucose-Capillary 148 (H) 70 - 99 mg/dL    Comment: Glucose reference range applies only to samples taken after fasting for at least 8 hours.  Glucose, capillary     Status: Abnormal   Collection Time: 03/03/21  8:00 AM  Result Value Ref Range   Glucose-Capillary 143 (H) 70 - 99 mg/dL    Comment: Glucose reference range applies only to samples taken after fasting for at least 8 hours.  Renal function panel     Status: Abnormal   Collection Time: 03/03/21 10:23 AM  Result Value Ref Range   Sodium 133 (L) 135 - 145 mmol/L   Potassium 3.3 (  L) 3.5 - 5.1 mmol/L   Chloride 97 (L) 98 - 111 mmol/L   CO2 27 22 - 32 mmol/L   Glucose, Bld 155 (H) 70 - 99 mg/dL    Comment: Glucose reference range applies only to samples taken after fasting for at least 8 hours.   BUN 44 (H) 6 - 20 mg/dL   Creatinine, Ser 3.98 (H) 0.44 - 1.00 mg/dL   Calcium 7.6 (L) 8.9 - 10.3 mg/dL   Phosphorus 4.7 (H) 2.5 - 4.6 mg/dL   Albumin 2.6 (L) 3.5 - 5.0 g/dL   GFR, Estimated 13 (L) >60 mL/min    Comment: (NOTE) Calculated using the CKD-EPI Creatinine Equation (2021)    Anion gap 9 5 - 15    Comment: Performed at [facility], [address] [place], [place] [address]  CBC     Status: Abnormal   Collection Time: 03/03/21 10:23 AM  Result Value Ref Range   WBC 14.2 (H) 4.0 - 10.5 K/uL   RBC 2.65 (L) 3.87 - 5.11 MIL/uL   Hemoglobin 7.4 (L) 12.0 - 15.0 g/dL   HCT 24.6 (L) 36.0 - 46.0 %   MCV 92.8 80.0 - 100.0 fL   MCH 27.9 26.0 - 34.0 pg    MCHC 30.1 30.0 - 36.0 g/dL   RDW 15.8 (H) 11.5 - 15.5 %   Platelets 261 150 - 400 K/uL   nRBC 0.4 (H) 0.0 - 0.2 %    Comment: Performed at [facility], [address] [place], [place] [address]  Glucose, capillary     Status: Abnormal   Collection Time: 03/03/21 11:50 AM  Result Value Ref Range   Glucose-Capillary 134 (H) 70 - 99 mg/dL    Comment: Glucose reference range applies only to samples taken after fasting for at least 8 hours.  Glucose, capillary     Status: Abnormal   Collection Time: 03/03/21  4:51 PM  Result Value Ref Range   Glucose-Capillary 123 (H) 70 - 99 mg/dL    Comment: Glucose reference range applies only to samples taken after fasting for at least 8 hours.  Glucose, capillary     Status: Abnormal   Collection Time: 03/03/21  8:52 PM  Result Value Ref Range   Glucose-Capillary 164 (H) 70 - 99 mg/dL    Comment: Glucose reference range applies only to samples taken after fasting for at least 8 hours.  CBC with Differential/Platelet     Status: Abnormal   Collection Time: 03/04/21  7:46 AM  Result Value Ref Range   WBC 12.8 (H) 4.0 - 10.5 K/uL   RBC 2.51 (L) 3.87 - 5.11 MIL/uL   Hemoglobin 6.9 (L) 12.0 - 15.0 g/dL   HCT 23.5 (L) 36.0 - 46.0 %   MCV 93.6 80.0 - 100.0 fL   MCH 27.5 26.0 - 34.0 pg   MCHC 29.4 (L) 30.0 - 36.0 g/dL   RDW 15.9 (H) 11.5 - 15.5 %   Platelets 252 150 - 400 K/uL   nRBC 0.2 0.0 - 0.2 %   Neutrophils Relative % 70 %   Neutro Abs 9.1 (H) 1.7 - 7.7 K/uL   Lymphocytes Relative 12 %   Lymphs Abs 1.5 0.7 - 4.0 K/uL   Monocytes Relative 13 %   Monocytes Absolute 1.6 (H) 0.1 - 1.0 K/uL   Eosinophils Relative 4 %   Eosinophils Absolute 0.5 0.0 - 0.5 K/uL   Basophils Relative 0 %   Basophils Absolute 0.0 0.0 - 0.1  K/uL   Immature Granulocytes 1 %   Abs Immature Granulocytes 0.17 (H) 0.00 - 0.07 K/uL    Comment: Performed at [facility], [address] [place], [place] [address]  Basic metabolic panel     Status:  Abnormal   Collection Time: 03/04/21  7:46 AM  Result Value Ref Range   Sodium 135 135 - 145 mmol/L   Potassium 3.9 3.5 - 5.1 mmol/L   Chloride 99 98 - 111 mmol/L   CO2 26 22 - 32 mmol/L   Glucose, Bld 109 (H) 70 - 99 mg/dL    Comment: Glucose reference range applies only to samples taken after fasting for at least 8 hours.   BUN 58 (H) 6 - 20 mg/dL   Creatinine, Ser 5.00 (H) 0.44 - 1.00 mg/dL   Calcium 7.8 (L) 8.9 - 10.3 mg/dL   GFR, Estimated 10 (L) >60 mL/min    Comment: (NOTE) Calculated using the CKD-EPI Creatinine Equation (2021)    Anion gap 10 5 - 15    Comment: Performed at [facility], [address] [place], [place] [address]  Glucose, capillary     Status: Abnormal   Collection Time: 03/04/21  8:12 AM  Result Value Ref Range   Glucose-Capillary 102 (H) 70 - 99 mg/dL    Comment: Glucose reference range applies only to samples taken after fasting for at least 8 hours.  Glucose, capillary     Status: Abnormal   Collection Time: 03/04/21  9:02 AM  Result Value Ref Range   Glucose-Capillary 126 (H) 70 - 99 mg/dL    Comment: Glucose reference range applies only to samples taken after fasting for at least 8 hours.  Prepare RBC (crossmatch)     Status: None   Collection Time: 03/04/21  9:29 AM  Result Value Ref Range   Order Confirmation      ORDER PROCESSED BY BLOOD BANK Performed at [facility], [address] [place], [place] [address]   Type and screen [place]     Status: None (Preliminary result)   Collection Time: 03/04/21 10:10 AM  Result Value Ref Range   ABO/RH(D) O NEG    Antibody Screen NEG    Sample Expiration 03/07/2021,2359    Unit Number [id number]    Blood Component Type RED CELLS,LR    Unit division 00    Status of Unit ISSUED    Transfusion Status OK TO TRANSFUSE    Crossmatch Result      Compatible Performed at [facility], [address] [place], [place] [address]   Glucose,  capillary     Status: Abnormal   Collection Time: 03/04/21 11:43 AM  Result Value Ref Range   Glucose-Capillary 134 (H) 70 - 99 mg/dL    Comment: Glucose reference range applies only to samples taken after fasting for at least 8 hours.   No results found.     Medical Problem List and Plan: 1. Functional deficits/debility secondary to acute on chronic renal failure superimposed with diastolic congestive heart failure  -patient may shower  -ELOS/Goals: 10-14 days modI  -Admit to CIR 2.  Antithrombotics: -DVT/anticoagulation:  Pharmaceutical: Other (comment) Eliquis  -antiplatelet therapy: N/A 3. Right foot pain: Tylenol as needed. D/c Dilaudid 4. Mood: Melatonin as needed  -antipsychotic agents: N/A 5. Neuropsych: This patient is capable of making decisions on her own behalf. 6. Skin/Wound Care: Routine skin checks 7. Fluids/Electrolytes/Nutrition: Routine in and outs with follow-up chemistries 8.  End-stage renal disease.  Permacath placed 02/25/2021 hemodialysis initiated 9.  History of hematuria.  Left ureteral stone with recent uteroscopy/laser lithotripsy.  Follow-up urology Dr Diamantina [place] 10.  Hypothyroidism.  Synthroid 11.  Diabetes mellitus.  Hemoglobin A1c 7.2.  SSI. 12.  Hypothyroidism.  Synthroid 13.  Hypertension.  Clonidine 0.2 mg twice daily, Norvasc 5 mg twice daily.  Monitor with increased mobility 14.  Hyperlipidemia.  Lipitor 15.  Atrial fibrillation/history of pulmonary emboli.  Continue Eliquis.  Lopressor 100 mg twice daily 16.  Acute on chronic anemia.  Transfuse 1 unit RBCs 03/04/2021.  Follow-up CBC 17.  Obesity.  BMI 62.17.  Dietary follow-up 18.  Chronic hypoxemic respiratory failure.  Continue oxygen therapy as directed as well as CPAP 19. Constipation: discussed trying suppository  20. Dry skin: order Eucerin cream.   I have personally performed a face to face diagnostic evaluation, including, but not limited to relevant history and physical exam findings,  of this patient and developed relevant assessment and plan.  Additionally, I have reviewed and concur with the physician assistant's documentation above.  Leeroy Cha, MD Lavon Paganini Osterdock, PA-C 03/04/2021

## 2021-03-04 NOTE — Progress Notes (Signed)
PMR Admission Coordinator Pre-Admission Assessment ° °Patient: Nicole Schmidt is an 55 y.o., female °MRN: [id number] °DOB: 03/05/1965 °Height: 5' 4" (162.6 cm) °Weight: (!) 164.3 kg ° °Insurance Information °HMO:     PPO: Yes     PCP:      IPA:      80/20:      OTHER:  °PRIMARY: BCBS      Policy#: [id number]      Subscriber: ptt °CM Name: Sarah W.      Phone#: [phone]     Fax#: [phone] °Pre-Cert#: [id number] auth for CIR from portal (updated by Sarah W) with updates due to fax listed above on 1/26.       Employer:  °Benefits:  Phone #: [phone]     Name:  °Eff. Date: 02/18/19     Deduct: $200 ($0 met)      Out of Pocket Max: $2000 ($0 met)      Life Max: n/a °CIR: 80%      SNF: 80% °Outpatient: 80%     Co Ins: 20% °Home Health: 80%      Co-Ins: 20% °DME: 80%     Co-Ins: 20% °Providers:  °SECONDARY:       Policy#:      Phone#:  ° °Financial Counselor:       Phone#:  ° °The “Data Collection Information Summary” for patients in Inpatient Rehabilitation Facilities with attached “Privacy Act Statement-Health Care Records” was provided and verbally reviewed with: N/A ° °Emergency Contact Information °Contact Information   ° ° Name Relation Home Work Mobile  ° Eudy,Susan Relative [phone]  [phone]  ° Bowman,Connie Relative [phone]    ° °  ° ° °Current Medical History  °Patient Admitting Diagnosis: debility, new HD ° °History of Present Illness: Nicole Schmidt is a 55-year-old right-handed female with history of chronic hypoxemic respiratory failure on 2 L oxygen, diabetes mellitus, breast cancer, history of hematuria left ureteral stone with recent left uteroscopy with laser lithotripsy and stent exchange ureteral stent per Dr.Sninsky, diabetes mellitus, hypertension, hyperlipidemia, gout, morbid obesity with BMI 62.17, atrial fibrillation/pulmonary emboli maintained on Eliquis.  Recent admission 02/01/2021 - 02/22/2021 for management of acute on chronic diastolic congestive heart failure and AKI  superimposed on CKD stage III.  [facility] ER with increasing lower extremity edema and increasing shortness of breath and diminished urine output.  Chest x-ray showed cardiomegaly subtle prominence of interstitial markings in the perihilar regions more so on the right side possibly suggesting mild asymmetric interstitial pulmonary edema.  Noted interval decrease in patchy infiltrates in the perihilar regions and lower lung fields suggesting resolving atelectasis/pneumonia.  Admission chemistries glucose 128 BUN 136 creatinine 5.82, WBC 16,600 hemoglobin 8.8, troponin 10, BNP 375.  Follow-up nephrology services permacath was placed 02/25/2021 hemodialysis initiated for ESRD and fluid overload and latest creatinine 4.39.  She remains on chronic Eliquis for history of atrial fibrillation pulmonary emboli.  Therapy evaluations completed due to patient decreased functional mobility was recommended for a comprehensive rehab program. °  ° °Patient's medical record from [facility] has been reviewed by the rehabilitation admission coordinator and physician. ° °Past Medical History  °Past Medical History:  °Diagnosis Date  ° (HFpEF) heart failure with preserved ejection fraction (HCC)   ° a. 10/2018 Echo: EF 60-65%, diast dysfxn, RVSP 50.7 mmHg, mildly dil LA.  ° Acquired hypothyroidism 02/09/2020  ° Anemia   ° Arthritis   ° Breast cancer (HCC) 2014  ° right breast cancer  °   Breast cancer of upper-inner quadrant of right female breast (HCC)   ° Right breast invasive CA and DCIS , 7 mm T1,N0,M0. Er/PR pos, her 2 negative.  Margins 1 mm.  ° CHF (congestive heart failure) (HCC)   ° CKD (chronic kidney disease), stage III (HCC)   ° Diabetes mellitus type 2 in obese (HCC) 02/09/2020  ° Diabetes mellitus without complication (HCC)   ° Dyslipidemia 02/09/2020  ° Essential hypertension   ° Gout   ° Hypothyroidism   ° Menopause   ° age 44  ° Morbid obesity (HCC)   ° Persistent atrial fibrillation (HCC)    ° a. Dx 10/2018 in setting of PNA. CHA2DS2VASc = 4-->Eliquis; b. 11/2018 s/p successful DCCV.  ° Personal history of radiation therapy   ° Pulmonary embolism (HCC) 10/2014  ° a. Chronic eliquis.  ° Thyroid goiter   ° ° °Has the patient had major surgery during 100 days prior to admission? Yes ° °Family History   °family history includes Atrial fibrillation in her mother; Colon cancer (age of onset: 54) in her cousin; Diabetes in her father; Heart attack in her mother; Heart failure in her mother; Hypertension in her father and mother; Parkinson's disease in her father; Rheum arthritis in her mother. ° °Current Medications ° °Current Facility-Administered Medications:  °  0.9 %  sodium chloride infusion (Manually program via Guardrails IV Fluids), , Intravenous, Once, Ayiku, Bernard, MD °  acetaminophen (TYLENOL) tablet 650 mg, 650 mg, Oral, Q6H PRN, 650 mg at 03/04/21 0345 **OR** acetaminophen (TYLENOL) suppository 650 mg, 650 mg, Rectal, Q6H PRN, Dew, Jason S, MD °  albuterol (PROVENTIL) (2.5 MG/3ML) 0.083% nebulizer solution 2.5 mg, 2.5 mg, Nebulization, Q6H PRN, Dew, Jason S, MD °  amLODipine (NORVASC) tablet 5 mg, 5 mg, Oral, BID, Dew, Jason S, MD, 5 mg at 03/03/21 2138 °  apixaban (ELIQUIS) tablet 5 mg, 5 mg, Oral, BID, Dew, Jason S, MD, 5 mg at 03/04/21 0834 °  atorvastatin (LIPITOR) tablet 20 mg, 20 mg, Oral, QHS, Dew, Jason S, MD, 20 mg at 03/03/21 2138 °  bisacodyl (DULCOLAX) suppository 10 mg, 10 mg, Rectal, Once, Ayiku, Bernard, MD °  calcitRIOL (ROCALTROL) capsule 0.25 mcg, 0.25 mcg, Oral, Daily, Dew, Jason S, MD, 0.25 mcg at 03/04/21 0834 °  calcium carbonate (OS-CAL - dosed in mg of elemental calcium) tablet 500 mg of elemental calcium, 500 mg of elemental calcium, Oral, Q breakfast, Dew, Jason S, MD, 500 mg of elemental calcium at 03/04/21 0834 °  Chlorhexidine Gluconate Cloth 2 % PADS 6 each, 6 each, Topical, Q0600, Breeze, Shantelle, NP, 6 each at 03/04/21 0505 °  cloNIDine (CATAPRES) tablet 0.2 mg,  0.2 mg, Oral, BID, Dew, Jason S, MD, 0.2 mg at 03/03/21 2138 °  epoetin alfa (EPOGEN) injection 10,000 Units, 10,000 Units, Intravenous, Q M,W,F-HD, Breeze, Shantelle, NP, 10,000 Units at 03/02/21 1129 °  folic acid (FOLVITE) tablet 1 mg, 1,000 mcg, Oral, Daily, Dew, Jason S, MD, 1 mg at 03/04/21 0834 °  HYDROmorphone (DILAUDID) injection 0.5 mg, 0.5 mg, Intravenous, Q3H PRN, Ayiku, Bernard, MD °  insulin aspart (novoLOG) injection 0-9 Units, 0-9 Units, Subcutaneous, TID AC & HS, Dew, Jason S, MD, 2 Units at 03/03/21 2138 °  levothyroxine (SYNTHROID) tablet 112 mcg, 112 mcg, Oral, QAC breakfast, Dew, Jason S, MD, 112 mcg at 03/04/21 0834 °  magnesium hydroxide (MILK OF MAGNESIA) suspension 30 mL, 30 mL, Oral, Daily PRN, Dew, Jason S, MD °  melatonin tablet 10 mg, 10 mg,   Oral, QHS PRN, Dew, Jason S, MD, 10 mg at 03/03/21 2338 °  metoprolol tartrate (LOPRESSOR) tablet 100 mg, 100 mg, Oral, BID, Dew, Jason S, MD, 100 mg at 03/03/21 2138 °  ondansetron (ZOFRAN) tablet 4 mg, 4 mg, Oral, Q6H PRN **OR** ondansetron (ZOFRAN) injection 4 mg, 4 mg, Intravenous, Q6H PRN, Dew, Jason S, MD °  traZODone (DESYREL) tablet 25 mg, 25 mg, Oral, QHS PRN, Dew, Jason S, MD, 25 mg at 03/01/21 2248 °  Vitamin D (Ergocalciferol) (DRISDOL) capsule 50,000 Units, 50,000 Units, Oral, Q Fri, Dew, Jason S, MD, 50,000 Units at 03/04/21 0834 ° °Patients Current Diet:  °Diet Order   ° °       °  Diet renal/carb modified with fluid restriction Diet-HS Snack? Nothing; Fluid restriction: 1200 mL Fluid; Room service appropriate? Yes; Fluid consistency: Thin  Diet effective now       °  ° °  °  ° °  ° ° °Precautions / Restrictions °Precautions °Precautions: Fall °Restrictions °Weight Bearing Restrictions: No °RLE Weight Bearing: Partial weight bearing °LLE Weight Bearing: Partial weight bearing  ° °Has the patient had 2 or more falls or a fall with injury in the past year? No ° °Prior Activity Level °Community (5-7x/wk): independent prior to admit, no  DME used, driving, working ° °Prior Functional Level °Self Care: Did the patient need help bathing, dressing, using the toilet or eating? Independent ° °Indoor Mobility: Did the patient need assistance with walking from room to room (with or without device)? Independent ° °Stairs: Did the patient need assistance with internal or external stairs (with or without device)? Independent ° °Functional Cognition: Did the patient need help planning regular tasks such as shopping or remembering to take medications? Independent ° °Patient Information °Are you of Hispanic, Latino/a,or Spanish origin?: A. No, not of Hispanic, Latino/a, or Spanish origin °What is your race?: A. White °Do you need or want an interpreter to communicate with a doctor or health care staff?: 0. No ° °Patient's Response To:  °Health Literacy and Transportation °Is the patient able to respond to health literacy and transportation needs?: Yes °Health Literacy - How often do you need to have someone help you when you read instructions, pamphlets, or other written material from your doctor or pharmacy?: Never °In the past 12 months, has lack of transportation kept you from medical appointments or from getting medications?: No °In the past 12 months, has lack of transportation kept you from meetings, work, or from getting things needed for daily living?: No ° °Home Assistive Devices / Equipment °Home Assistive Devices/Equipment: CBG Meter, Oxygen °Home Equipment: Cane - single point, Rollator (4 wheels), Rolling Walker (2 wheels), BSC/3in1, Wheelchair - manual, Shower seat ° °Prior Device Use: Indicate devices/aids used by the patient prior to current illness, exacerbation or injury? None of the above ° °Current Functional Level °Cognition ° Overall Cognitive Status: Within Functional Limits for tasks assessed °Orientation Level: Oriented X4 °General Comments: very motivated to participate, however frustrated with pain levels °   °Extremity  Assessment °(includes Sensation/Coordination) ° Upper Extremity Assessment: Generalized weakness  °Lower Extremity Assessment: Generalized weakness  °  °ADLs ° Overall ADL's : Needs assistance/impaired °Toilet Transfer: Minimal assistance, BSC/3in1, Rolling walker (2 wheels) °Toilet Transfer Details (indicate cue type and reason): simulated °Toileting- Clothing Manipulation and Hygiene: Moderate assistance, Sit to/from stand °Functional mobility during ADLs: Minimal assistance, Rolling walker (2 wheels)  °  °Mobility ° General bed mobility comments: not performed as pt received in   chair  °  °Transfers ° Overall transfer level: Needs assistance °Equipment used: Rolling walker (2 wheels) °Transfers: Sit to/from Stand °Sit to Stand: Supervision °General transfer comment: safe technique. Still requires heavy B UE assistance in order to stand. Once standing, upright posture noted  °  °Ambulation / Gait / Stairs / Wheelchair Mobility ° Ambulation/Gait °Ambulation/Gait assistance: Supervision °Gait Distance (Feet): 55 Feet °Assistive device: Rolling walker (2 wheels) °Gait Pattern/deviations: Step-through pattern °General Gait Details: ambulated with step to gait pattern. 1 standing rest break required. Heavy WBing through B UE due to R foot pain. Pt on 2L of O2 with exertion decreasing to 86% with ambulation. °Gait velocity: decreased  °  °Posture / Balance Balance °Overall balance assessment: Mild deficits observed, not formally tested °Sitting-balance support: Feet supported °Sitting balance-Leahy Scale: Good °Standing balance support: During functional activity, Reliant on assistive device for balance °Standing balance-Leahy Scale: Fair °Standing balance comment: increased lateral weight shift during ambulation due to body habitus  °  °Special needs/care consideration CPAP, Dialysis: Hemodialysis Monday, Wednesday, and Friday, Oxygen 4L in hospital, CPAP at night, and Diabetic management yes  ° °Previous Home  Environment (from acute therapy documentation) °Living Arrangements: Alone °Available Help at Discharge: Family, Friend(s), Available PRN/intermittently °Type of Home: House °Home Layout: One level °Home Access: Stairs to enter °Entrance Stairs-Rails: Right, Left °Entrance Stairs-Number of Steps: 2 in front and 4 in back °Bathroom Shower/Tub: Walk-in shower °Bathroom Toilet: Handicapped height °Bathroom Accessibility: Yes °Home Care Services: No °Additional Comments: Pt currently lives in her mother's old home that is 1 level with level entry, but planning to move to a new house as soon as possible that's 1 level with 4 steps to enter with B rails. She does not want to return to mother's home as she believe it has mold. ° °Discharge Living Setting °Plans for Discharge Living Setting: Patient's home °Type of Home at Discharge: House °Discharge Home Layout: One level °Discharge Home Access: Stairs to enter °Entrance Stairs-Rails: Right, Left °Entrance Stairs-Number of Steps: 2-4 °Discharge Bathroom Shower/Tub: Walk-in shower °Discharge Bathroom Toilet: Handicapped height °Discharge Bathroom Accessibility: Yes °How Accessible: Accessible via walker °Does the patient have any problems obtaining your medications?: No ° °Social/Family/Support Systems °Anticipated Caregiver: contact is Susie Eudy (cousin) °Anticipated Caregiver's Contact Information: [phone] °Ability/Limitations of Caregiver: see additional info °Caregiver Availability: Intermittent °Discharge Plan Discussed with Primary Caregiver: Yes °Is Caregiver In Agreement with Plan?: Yes °Does Caregiver/Family have Issues with Lodging/Transportation while Pt is in Rehab?: No ° °Goals °Patient/Family Goal for Rehab: PT/OT mod I, SLP n/a °Expected length of stay: 10-14 days °Additional Information: Discussed typical recommendation for 24/7 on initial discharge with pt/family.  Shared that it was possible that she could reach mod I level, but if not would need to  start making arrangements for 24/7 between family, friends, and potentially hired caregivers. °Pt/Family Agrees to Admission and willing to participate: Yes °Program Orientation Provided & Reviewed with Pt/Caregiver Including Roles  & Responsibilities: Yes ° Barriers to Discharge: Decreased caregiver support, Inaccessible home environment ° °Decrease burden of Care through IP rehab admission: n/a ° °Possible need for SNF placement upon discharge: Not anticipated ° °Patient Condition: I have reviewed medical records from [facility], spoken with CM, and patient and family member. I discussed via phone for inpatient rehabilitation assessment.  Patient will benefit from ongoing PT and OT, can actively participate in 3 hours of therapy a day 5 days of the week, and can make measurable gains during the admission.  Patient   will also benefit from the coordinated team approach during an Inpatient Acute Rehabilitation admission.  The patient will receive intensive therapy as well as Rehabilitation physician, nursing, social worker, and care management interventions.  Due to safety, disease management, medication administration, pain management, and patient education the patient requires 24 hour a day rehabilitation nursing.  The patient is currently min assist to min guard with mobility and basic ADLs.  Discharge setting and therapy post discharge at home with home health is anticipated.  Patient has agreed to participate in the Acute Inpatient Rehabilitation Program and will admit today. ° °Preadmission Screen Completed By:  Shantella Blubaugh E Maxene Byington, PT, DPT 03/04/2021 10:48 AM °______________________________________________________________________   °Discussed status with Dr. Raulkar on 03/04/21  at 10:48 AM  and received approval for admission today. ° °Admission Coordinator:  Tanylah Schnoebelen E Desirre Eickhoff, PT, DPT time 10:48 AM /Date 03/04/21   ° °Assessment/Plan: °Diagnosis: Debility °Does the need for close, 24 hr/day Medical supervision in  concert with the patient's rehab needs make it unreasonable for this patient to be served in a less intensive setting? Yes °Co-Morbidities requiring supervision/potential complications: morbid obesity, right food pain, acute on chronic renal failure, cardiorenal syndrome, OSA °Due to bladder management, bowel management, safety, skin/wound care, disease management, medication administration, pain management, and patient education, does the patient require 24 hr/day rehab nursing? Yes °Does the patient require coordinated care of a physician, rehab nurse, PT, OT to address physical and functional deficits in the context of the above medical diagnosis(es)? Yes °Addressing deficits in the following areas: balance, endurance, locomotion, strength, transferring, bowel/bladder control, bathing, dressing, feeding, grooming, toileting, and psychosocial support °Can the patient actively participate in an intensive therapy program of at least 3 hrs of therapy 5 days a week? Yes °The potential for patient to make measurable gains while on inpatient rehab is excellent °Anticipated functional outcomes upon discharge from inpatient rehab: modified independent PT, modified independent OT, independent SLP °Estimated rehab length of stay to reach the above functional goals is: 10-14 days °Anticipated discharge destination: Home °10. Overall Rehab/Functional Prognosis: excellent ° ° °MD Signature: °Krutika Raulkar, MD °

## 2021-03-05 LAB — COMPREHENSIVE METABOLIC PANEL
ALT: 41 U/L (ref 0–44)
AST: 36 U/L (ref 15–41)
Albumin: 2.4 g/dL — ABNORMAL LOW (ref 3.5–5.0)
Alkaline Phosphatase: 88 U/L (ref 38–126)
Anion gap: 14 (ref 5–15)
BUN: 70 mg/dL — ABNORMAL HIGH (ref 6–20)
CO2: 24 mmol/L (ref 22–32)
Calcium: 8.4 mg/dL — ABNORMAL LOW (ref 8.9–10.3)
Chloride: 98 mmol/L (ref 98–111)
Creatinine, Ser: 6.04 mg/dL — ABNORMAL HIGH (ref 0.44–1.00)
GFR, Estimated: 8 mL/min — ABNORMAL LOW (ref >60)
Glucose, Bld: 141 mg/dL — ABNORMAL HIGH (ref 70–99)
Potassium: 3.9 mmol/L (ref 3.5–5.1)
Sodium: 136 mmol/L (ref 135–145)
Total Bilirubin: 0.4 mg/dL (ref 0.3–1.2)
Total Protein: 6.3 g/dL — ABNORMAL LOW (ref 6.5–8.1)

## 2021-03-05 LAB — CBC WITH DIFFERENTIAL/PLATELET
Abs Immature Granulocytes: 0.07 10*3/uL (ref 0.00–0.07)
Basophils Absolute: 0 10*3/uL (ref 0.0–0.1)
Basophils Relative: 0 %
Eosinophils Absolute: 0.4 10*3/uL (ref 0.0–0.5)
Eosinophils Relative: 4 %
HCT: 25.5 % — ABNORMAL LOW (ref 36.0–46.0)
Hemoglobin: 7.5 g/dL — ABNORMAL LOW (ref 12.0–15.0)
Immature Granulocytes: 1 %
Lymphocytes Relative: 9 %
Lymphs Abs: 1.1 10*3/uL (ref 0.7–4.0)
MCH: 27.4 pg (ref 26.0–34.0)
MCHC: 29.4 g/dL — ABNORMAL LOW (ref 30.0–36.0)
MCV: 93.1 fL (ref 80.0–100.0)
Monocytes Absolute: 1.3 10*3/uL — ABNORMAL HIGH (ref 0.1–1.0)
Monocytes Relative: 10 %
Neutro Abs: 9.2 10*3/uL — ABNORMAL HIGH (ref 1.7–7.7)
Neutrophils Relative %: 76 %
Platelets: 250 10*3/uL (ref 150–400)
RBC: 2.74 MIL/uL — ABNORMAL LOW (ref 3.87–5.11)
RDW: 15.9 % — ABNORMAL HIGH (ref 11.5–15.5)
WBC: 12.1 10*3/uL — ABNORMAL HIGH (ref 4.0–10.5)
nRBC: 0.5 % — ABNORMAL HIGH (ref 0.0–0.2)

## 2021-03-05 LAB — TYPE AND SCREEN
ABO/RH(D): O NEG
ABO/RH(D): O NEG
Antibody Screen: NEGATIVE
Antibody Screen: NEGATIVE
Unit division: 0

## 2021-03-05 LAB — BPAM RBC
Blood Product Expiration Date: 202301252359
ISSUE DATE / TIME: 202301201213
Unit Type and Rh: 9500

## 2021-03-05 LAB — VITAMIN D 25 HYDROXY (VIT D DEFICIENCY, FRACTURES): Vit D, 25-Hydroxy: 71.99 ng/mL (ref 30–100)

## 2021-03-05 LAB — GLUCOSE, CAPILLARY
Glucose-Capillary: 98 mg/dL (ref 70–99)
Glucose-Capillary: 116 mg/dL — ABNORMAL HIGH (ref 70–99)
Glucose-Capillary: 204 mg/dL — ABNORMAL HIGH (ref 70–99)
Glucose-Capillary: 151 mg/dL — ABNORMAL HIGH (ref 70–99)

## 2021-03-05 LAB — MAGNESIUM: Magnesium: 2.1 mg/dL (ref 1.7–2.4)

## 2021-03-05 MED ORDER — HEPARIN SODIUM (PORCINE) 1000 UNIT/ML IJ SOLN
INTRAMUSCULAR | Status: AC
  Administered 2021-03-05: 1000 [IU]
  Filled 2021-03-05: qty 4

## 2021-03-05 MED ORDER — GABAPENTIN 300 MG PO CAPS
300.0000 mg | ORAL_CAPSULE | Freq: Every day | ORAL | Status: DC
  Administered 2021-03-05 – 2021-03-15 (×11): 300 mg via ORAL
  Filled 2021-03-05 (×12): qty 1

## 2021-03-05 MED ORDER — CHLORHEXIDINE GLUCONATE CLOTH 2 % EX PADS
6.0000 | MEDICATED_PAD | Freq: Every day | CUTANEOUS | Status: DC
  Administered 2021-03-05 – 2021-03-16 (×11): 6 via TOPICAL

## 2021-03-05 NOTE — Progress Notes (Signed)
Physical Therapy Session Note  Patient Details  Name: Nicole Schmidt MRN: [id number] Date of Birth: 1965-04-08  Today's Date: 03/05/2021 PT Individual Time: 1100-1153 PT Individual Time Calculation (min): 53 min   Short Term Goals: Week 1:  PT Short Term Goal 1 (Week 1): STG=LTG due to LOS  Skilled Therapeutic Interventions/Progress Updates:   Received pt sitting in recliner requesting to use bathroom. Pt agreeable to PT treatment and reported continued pain in R foot with weight bearing activities (premedicated) - offered repositioning and rest breaks to reduce pain. Session with emphasis on functional mobility/transfers, generalized strengthening, dynamic standing balance/coordination, ambulation, stimulated car transfers, toileting, and improved activity tolerance. Sit<>stand without AD and CGA and ambulated 45ft x 2 trials without AD and CGA in/out of bathroom. Pt able to perform clothing management standing with CGA and continent of bladder - RN made aware. Noted knee high ted hose cutting off circulation and creating a tourniquet effect - consulted with dialysis MD at bedside, who reported pt does not need to wear them. Doffed ted hose and donned non-skid socks with total A. Stand<>pivot recliner<>WC without AD and CGA and transported to/from room in [facility] dependently for time management purposes. Pt performed ambulatory simulated car transfer without AD and CGA/min A from SUV height (placed 6in step at passenger door to simulate running board). Pt with increased difficulty getting LEs in/out of car but able to do so without assist. Pt required extensive rest break due to fatigue - O2 sat 84% increasing to 92% with rest (on 2L). Pt ambulated 76ft without AD and CGA to recliner and performed the following exercises with supervision and verbal cues for technique with emphasis on strength/ROM: -LAQ 2x15 bilaterally -hip flexion 2x15 bilaterally - decreased ROM due to body habitus -hip adduction pillow  squeezes 2x15 -heel/toe raises 2x20 bilaterally - to promote blood circulation and reduce edema Concluded session with pt sitting in recliner with all needs within reach.   Therapy Documentation Precautions:  Restrictions Weight Bearing Restrictions: No RLE Weight Bearing: Weight bearing as tolerated LLE Weight Bearing: Weight bearing as tolerated  Therapy/Group: Individual Therapy Alfonse Alpers PT, DPT   03/05/2021, 7:35 AM

## 2021-03-05 NOTE — Plan of Care (Signed)
°  Problem: RH Balance Goal: LTG Patient will maintain dynamic standing with ADLs (OT) Description: LTG:  Patient will maintain dynamic standing balance with assist during activities of daily living (OT)  Flowsheets (Taken 03/05/2021 1226) LTG: Pt will maintain dynamic standing balance during ADLs with: Independent with assistive device   Problem: Sit to Stand Goal: LTG:  Patient will perform sit to stand in prep for activites of daily living with assistance level (OT) Description: LTG:  Patient will perform sit to stand in prep for activites of daily living with assistance level (OT) Flowsheets (Taken 03/05/2021 1226) LTG: PT will perform sit to stand in prep for activites of daily living with assistance level: Independent with assistive device   Problem: RH Grooming Goal: LTG Patient will perform grooming w/assist,cues/equip (OT) Description: LTG: Patient will perform grooming with assist, with/without cues using equipment (OT) Flowsheets (Taken 03/05/2021 1226) LTG: Pt will perform grooming with assistance level of: Independent with assistive device    Problem: RH Bathing Goal: LTG Patient will bathe all body parts with assist levels (OT) Description: LTG: Patient will bathe all body parts with assist levels (OT) Flowsheets (Taken 03/05/2021 1226) LTG: Pt will perform bathing with assistance level/cueing: Independent with assistive device    Problem: RH Dressing Goal: LTG Patient will perform lower body dressing w/assist (OT) Description: LTG: Patient will perform lower body dressing with assist, with/without cues in positioning using equipment (OT) Flowsheets (Taken 03/05/2021 1226) LTG: Pt will perform lower body dressing with assistance level of: Independent with assistive device   Problem: RH Toileting Goal: LTG Patient will perform toileting task (3/3 steps) with assistance level (OT) Description: LTG: Patient will perform toileting task (3/3 steps) with assistance level (OT)   Flowsheets (Taken 03/05/2021 1226) LTG: Pt will perform toileting task (3/3 steps) with assistance level: Independent with assistive device   Problem: RH Simple Meal Prep Goal: LTG Patient will perform simple meal prep w/assist (OT) Description: LTG: Patient will perform simple meal prep with assistance, with/without cues (OT). Flowsheets (Taken 03/05/2021 1226) LTG: Pt will perform simple meal prep with assistance level of: Independent with assistive device   Problem: RH Light Housekeeping Goal: LTG Patient will perform light housekeeping w/assist (OT) Description: LTG: Patient will perform light housekeeping with assistance, with/without cues (OT). Flowsheets (Taken 03/05/2021 1226) LTG: Pt will perform light housekeeping with assistance level of: Independent with assistive device   Problem: RH Toilet Transfers Goal: LTG Patient will perform toilet transfers w/assist (OT) Description: LTG: Patient will perform toilet transfers with assist, with/without cues using equipment (OT) Flowsheets (Taken 03/05/2021 1226) LTG: Pt will perform toilet transfers with assistance level of: Independent with assistive device   Problem: RH Tub/Shower Transfers Goal: LTG Patient will perform tub/shower transfers w/assist (OT) Description: LTG: Patient will perform tub/shower transfers with assist, with/without cues using equipment (OT) Flowsheets (Taken 03/05/2021 1226) LTG: Pt will perform tub/shower stall transfers with assistance level of: Independent with assistive device   Problem: RH Furniture Transfers Goal: LTG Patient will perform furniture transfers w/assist (OT/PT) Description: LTG: Patient will perform furniture transfers  with assistance (OT/PT). Flowsheets (Taken 03/05/2021 1226) LTG: Pt will perform furniture transfers with assist:: Independent with assistive device

## 2021-03-05 NOTE — Progress Notes (Deleted)
Renal Service Consult Note [facility]  Nicole Schmidt 03/07/2021 Sol Blazing, MD Requesting Physician: Dr Ranell Patrick  Reason for Consult: ESRD pt, new start, transferred to [facility] for rehab HPI: The patient is a 56 y.o. year-old w/ hx of anemia, breast cancer, CKD, DM2, HTN, gout, obesity, hx PE, atrial fib who was admitted at [facility] for acute on chronic renal failure stage IV, a/c diast CHF, OSA, DM2, ACHRF and atrial fib.  Pt was started on hemodialysis w/ TDC placement on 02/25/21. Received prbc's x 1 and was seen by OT/ PT and deemed a got candidate for CIR. Pt transferred from [facility] on 1/20 for admit to CIR at [facility].  We are asked to see for ESRD.    Pt seen in room.  Main issue is bilat LE edema. Has improved just a bit since starting dialysis at [facility] this past week or so. No orthopnea or prod cough. No CP or abd pain , no n/v/d. Up in chair.    ROS - denies CP, no joint pain, no HA, no blurry vision, no rash, no diarrhea, no nausea/ vomiting, no dysuria, no difficulty voiding   Past Medical History  Past Medical History:  Diagnosis Date   (HFpEF) heart failure with preserved ejection fraction ([place])    a. 10/2018 Echo: EF 60-65%, diast dysfxn, RVSP 50.7 mmHg, mildly dil LA.   Acquired hypothyroidism 02/09/2020   Anemia    Arthritis    Breast cancer ([place]) 2014   right breast cancer   Breast cancer of upper-inner quadrant of right female breast ([place])    Right breast invasive CA and DCIS , 7 mm T1,N0,M0. Er/PR pos, her 2 negative.  Margins 1 mm.   CHF (congestive heart failure) (HCC)    CKD (chronic kidney disease), stage III (HCC)    Diabetes mellitus type 2 in obese ([place]) 02/09/2020   Diabetes mellitus without complication ([place])    Dyslipidemia 02/09/2020   Essential hypertension    Gout    Hypothyroidism    Menopause    age 73   Morbid obesity ([place])    Persistent atrial fibrillation ([place])    a. Dx 10/2018 in setting of PNA. CHA2DS2VASc = 4-->Eliquis; b. 11/2018  s/p successful DCCV.   Personal history of radiation therapy    Pulmonary embolism ([place]) 10/2014   a. Chronic eliquis.   Thyroid goiter    Past Surgical History  Past Surgical History:  Procedure Laterality Date   BREAST BIOPSY Right 2014   breast ca   BREAST EXCISIONAL BIOPSY Right 07/19/2012   breast ca   BREAST SURGERY Right 2014   with sentinel node bx subareaolar duct excision   CARDIOVERSION N/A 11/28/2018   Procedure: CARDIOVERSION;  Surgeon: Minna Merritts, MD;  Location: [facility] ORS;  Service: Cardiovascular;  Laterality: N/A;   COLONOSCOPY WITH PROPOFOL N/A 01/30/2017   Procedure: COLONOSCOPY WITH PROPOFOL;  Surgeon: Christene Lye, MD;  Location: [facility] ENDOSCOPY;  Service: Endoscopy;  Laterality: N/A;   CYSTOSCOPY W/ URETERAL STENT PLACEMENT Left 02/14/2021   Procedure: CYSTOSCOPY WITH RETROGRADE PYELOGRAM/URETERAL STENT PLACEMENT;  Surgeon: Billey Co, MD;  Location: [facility] ORS;  Service: Urology;  Laterality: Left;   DIALYSIS/PERMA CATHETER INSERTION N/A 02/24/2021   Procedure: DIALYSIS/PERMA CATHETER INSERTION;  Surgeon: Algernon Huxley, MD;  Location: [place] CV LAB;  Service: Cardiovascular;  Laterality: N/A;   Family History  Family History  Problem Relation Age of Onset   Diabetes Father    Hypertension Father  Parkinson's disease Father    Hypertension Mother    Rheum arthritis Mother    Atrial fibrillation Mother    Heart failure Mother    Heart attack Mother        died in her mid-70's.   Colon cancer Cousin 62   Breast cancer Neg Hx    Social History  reports that she has never smoked. She has never used smokeless tobacco. She reports that she does not drink alcohol and does not use drugs. Allergies No Known Allergies Home medications Prior to Admission medications   Medication Sig Start Date End Date Taking? Authorizing Provider  acetaminophen (TYLENOL) 500 MG tablet Take 1,000 mg by mouth every 6 (six) hours as needed for moderate pain  (for pain).   Yes [provider]  albuterol (VENTOLIN HFA) 108 (90 Base) MCG/ACT inhaler Inhale 2 puffs into the lungs every 6 (six) hours as needed for wheezing or shortness of breath.   Yes [provider]  amLODipine (NORVASC) 5 MG tablet Take 5 mg by mouth 2 (two) times daily. 09/18/18  Yes [provider]  atorvastatin (LIPITOR) 20 MG tablet Take 20 mg by mouth at bedtime.    Yes [provider]  calcitRIOL (ROCALTROL) 0.25 MCG capsule Take 0.25 mcg by mouth daily. 05/13/20  Yes [provider]  calcium carbonate (OSCAL) 1500 (600 Ca) MG TABS tablet Take 600 mg of elemental calcium by mouth daily.    Yes [provider]  Cholecalciferol (VITAMIN D3) 50000 units TABS Take 50,000 Units by mouth once a week.   Yes [provider]  cloNIDine (CATAPRES) 0.2 MG tablet Take 1 tablet (0.2 mg total) by mouth 2 (two) times daily. 10/21/18  Yes Mayo, Pete Pelt, MD  Dulaglutide 1.5 MG/0.5ML SOPN Inject 1.5 mg into the skin every 7 (seven) days.   Yes [provider]  ELIQUIS 5 MG TABS tablet TAKE 1 TABLET TWICE A DAY Patient taking differently: Take 5 mg by mouth 2 (two) times daily. 12/27/20  Yes Sindy Guadeloupe, MD  levothyroxine (SYNTHROID, LEVOTHROID) 112 MCG tablet Take 112 mcg daily before breakfast by mouth.   Yes [provider]  Melatonin 10 MG TABS Take 10 mg by mouth at bedtime.   Yes [provider]  metoprolol tartrate (LOPRESSOR) 100 MG tablet Take 1 tablet (100 mg total) by mouth 2 (two) times daily. 02/22/21 03/24/21 Yes Richarda Osmond, MD  fluticasone-salmeterol (ADVAIR HFA) [phone] MCG/ACT inhaler Inhale 2 puffs into the lungs 2 (two) times daily as needed.  Patient not taking: No sig reported  02/09/20  [provider]     Vitals:   03/06/21 2219 03/07/21 0517 03/07/21 0525 03/07/21 0740  BP:  131/88  (!) 155/59  Pulse: 79 68  81  Resp: 16 18  16   Temp:  97.8 F (36.6 C)  (!) 97.4 F  (36.3 C)  TempSrc:  Oral  Oral  SpO2: 98% 97%  97%  Weight:   (!) 164.3 kg   Height:        Exam:   alert, nad , obese  no jvd  Chest cta bilat  Cor reg no RG  Abd soft ntnd no ascites   Ext diffuse 2+ bilat LE edema   Alert, NF, ox3   RIJ TDC in place   Assessment/ Plan: AKI on CKD IV: baseline creatinine of 2.43 in Nov 2022. Dialysis initiated on 02/25/21 at [facility]. Arranged for outpatient HD at [facility] on TTS  schedule. Transferred [place]son to CIR at [facility] for rehab.  HD on TTS schedule here. HD today.  Hypertension w/ chronic kidney disease: On amlodipine, clonidine and metoprolol. BP stable. Sig volume overload w/ diffuse LE edema. Needs high UFG's on HD.  Anemia of chronic kidney disease: Iron levels consistent with iron deficiency. SP prbc's.  Hematuria: Ureteral stent placed by urology on 02/14/2021.  With recent episode of nephrolithiasis.  No further reports of hematuria reports Secondary Hyperparathyroidism: PTH on 12/30/20 was 72. Currently without binders. Phosphorus 4.7. Continue calcitriol and ergocalciferol.  Acute respiratory failure: increased O2 needed initially from her baseline of 2 liters. Weaned to 2L now. Still vol overload.      Rob Elva Breaker 03/05/2021, 1:54 PM      Kelly Splinter  MD 03/07/2021, 11:12 AM  Recent Labs  Lab 03/04/21 0746 03/05/21 1017  WBC 12.8* 12.1*  HGB 6.9* 7.5*   Recent Labs  Lab 03/01/21 0719 03/03/21 1023 03/04/21 0746 03/05/21 1017  K 4.4 3.3* 3.9 3.9  BUN 62* 44* 58* 70*  CREATININE 4.39* 3.98* 5.00* 6.04*  CALCIUM 8.3* 7.6* 7.8* 8.4*  PHOS 4.4 4.7*  --   --

## 2021-03-05 NOTE — Evaluation (Addendum)
Physical Therapy Assessment and Plan  Patient Details  Name: KATHRYNN BACKSTROM MRN: [id number] Date of Birth: 1965/03/01  PT Diagnosis: Abnormal posture, Abnormality of gait, Difficulty walking, Edema, Muscle weakness, and Pain in R foot and buttocks Rehab Potential: Good ELOS: 7-10 days   Today's Date: 03/05/2021 PT Individual Time: 1100-1153 PT Individual Time Calculation (min): 53 min    Hospital Problem: Principal Problem:   Debility   Past Medical History:  Past Medical History:  Diagnosis Date   (HFpEF) heart failure with preserved ejection fraction ([place])    a. 10/2018 Echo: EF 60-65%, diast dysfxn, RVSP 50.7 mmHg, mildly dil LA.   Acquired hypothyroidism 02/09/2020   Anemia    Arthritis    Breast cancer ([place]) 2014   right breast cancer   Breast cancer of upper-inner quadrant of right female breast ([place])    Right breast invasive CA and DCIS , 7 mm T1,N0,M0. Er/PR pos, her 2 negative.  Margins 1 mm.   CHF (congestive heart failure) (HCC)    CKD (chronic kidney disease), stage III (HCC)    Diabetes mellitus type 2 in obese ([place]) 02/09/2020   Diabetes mellitus without complication ([place])    Dyslipidemia 02/09/2020   Essential hypertension    Gout    Hypothyroidism    Menopause    age 56   Morbid obesity ([place])    Persistent atrial fibrillation ([place])    a. Dx 10/2018 in setting of PNA. CHA2DS2VASc = 4-->Eliquis; b. 11/2018 s/p successful DCCV.   Personal history of radiation therapy    Pulmonary embolism ([place]) 10/2014   a. Chronic eliquis.   Thyroid goiter    Past Surgical History:  Past Surgical History:  Procedure Laterality Date   BREAST BIOPSY Right 2014   breast ca   BREAST EXCISIONAL BIOPSY Right 07/19/2012   breast ca   BREAST SURGERY Right 2014   with sentinel node bx subareaolar duct excision   CARDIOVERSION N/A 11/28/2018   Procedure: CARDIOVERSION;  Surgeon: Minna Merritts, MD;  Location: [facility] ORS;  Service: Cardiovascular;  Laterality: N/A;    COLONOSCOPY WITH PROPOFOL N/A 01/30/2017   Procedure: COLONOSCOPY WITH PROPOFOL;  Surgeon: Christene Lye, MD;  Location: [facility] ENDOSCOPY;  Service: Endoscopy;  Laterality: N/A;   CYSTOSCOPY W/ URETERAL STENT PLACEMENT Left 02/14/2021   Procedure: CYSTOSCOPY WITH RETROGRADE PYELOGRAM/URETERAL STENT PLACEMENT;  Surgeon: Billey Co, MD;  Location: [facility] ORS;  Service: Urology;  Laterality: Left;   DIALYSIS/PERMA CATHETER INSERTION N/A 02/24/2021   Procedure: DIALYSIS/PERMA CATHETER INSERTION;  Surgeon: Algernon Huxley, MD;  Location: [place] CV LAB;  Service: Cardiovascular;  Laterality: N/A;    Assessment & Plan Clinical Impression: Patient is a 56 y.o. year old female with history of chronic hypoxemic respiratory failure on 2 L oxygen, diabetes mellitus, breast cancer, history of hematuria left ureteral stone with recent left uteroscopy with laser lithotripsy and stent exchange ureteral stent per Dr.Sninsky, diabetes mellitus, hypertension, hyperlipidemia, gout, morbid obesity with BMI 62.17, atrial fibrillation/pulmonary emboli maintained on Eliquis.  Recent admission 02/01/2021 - 02/22/2021 for management of acute on chronic diastolic congestive heart failure and AKI superimposed on CKD stage III.  Per chart review patient lives alone.  1 level home 2 steps to entry.  Reports independent without assistive device prior to admission.  Presented to [facility] 02/23/2021 ER with increasing lower extremity edema and increasing shortness of breath and diminished urine output.  Chest x-ray showed cardiomegaly subtle prominence of interstitial markings in the perihilar  regions more so on the right side possibly suggesting mild asymmetric interstitial pulmonary edema.  Noted interval decrease in patchy infiltrates in the perihilar regions and lower lung fields suggesting resolving atelectasis/pneumonia.  Admission chemistries glucose 128 BUN 136 creatinine 5.82, WBC 16,600 hemoglobin 8.8,  troponin 10, BNP 375.  Follow-up nephrology services permacath was placed 02/25/2021 hemodialysis initiated for ESRD and fluid overload and latest creatinine 4.39.  She remains on chronic Eliquis for history of atrial fibrillation pulmonary emboli.  Therapy evaluations completed due to patient decreased functional mobility was admitted for a comprehensive rehab program.Currently complains of constipation.   Patient currently requires min with mobility secondary to muscle weakness, decreased cardiorespiratoy endurance and decreased oxygen support, and decreased standing balance, decreased postural control, and decreased balance strategies.  Prior to hospitalization, patient was independent  with mobility and lived with Alone in a House (going back to her house in [place]) home.  Home access is 2 steps in front with no rails and 4 steps in the back with 2 rails - plan to get railings installed on front stepsStairs to enter.  Patient will benefit from skilled PT intervention to maximize safe functional mobility, minimize fall risk, and decrease caregiver burden for planned discharge home with intermittent assist.  Anticipate patient will benefit from follow up [facility] at discharge.  PT - End of Session Activity Tolerance: Tolerates 30+ min activity with multiple rests Endurance Deficit: Yes Endurance Deficit Description: required multiple extended rest breaks throughout session PT Assessment Rehab Potential (ACUTE/IP ONLY): Good PT Barriers to Discharge: Inaccessible home environment;Decreased caregiver support;Home environment access/layout;Weight;Hemodialysis PT Barriers to Discharge Comments: 2 STE at front with 0 handrails, lives alone, now on hemodialysis, pain, decreased endurance PT Patient demonstrates impairments in the following area(s): Balance;Edema;Endurance;Nutrition;Pain;Skin Integrity PT Transfers Functional Problem(s): Bed Mobility;Bed to Chair;Car;Furniture PT Locomotion Functional  Problem(s): Ambulation;Wheelchair Mobility;Stairs PT Plan PT Intensity: Minimum of 1-2 x/day ,45 to 90 minutes PT Frequency: 5 out of 7 days PT Duration Estimated Length of Stay: 7-10 days PT Treatment/Interventions: Ambulation/gait training;Discharge planning;Functional mobility training;Psychosocial support;Therapeutic Activities;Balance/vestibular training;Disease management/prevention;Neuromuscular re-education;Skin care/wound management;Therapeutic Exercise;Wheelchair propulsion/positioning;DME/adaptive equipment instruction;Pain management;Splinting/orthotics;UE/LE Strength taining/ROM;Community reintegration;Functional electrical stimulation;Patient/family education;Stair training;UE/LE Coordination activities PT Transfers Anticipated Outcome(s): Mod I with LRAD PT Locomotion Anticipated Outcome(s): Mod I with LRAD PT Recommendation Follow Up Recommendations: Home health PT Patient destination: Home Equipment Recommended: To be determined  PT Evaluation Precautions/Restrictions Precautions Precautions: Fall Precaution Comments: 2L O2 at baseline Restrictions Weight Bearing Restrictions: No Pain Interference Pain Interference Pain Effect on Sleep: 2. Occasionally Pain Interference with Therapy Activities: 4. Almost constantly Pain Interference with Day-to-Day Activities: 4. Almost constantly Home Living/Prior Functioning Home Living Available Help at Discharge: Family;Friend(s);Available PRN/intermittently Type of Home: House (going back to her house in [place]) Home Access: Stairs to enter [employer] of Steps: 2 steps in front with no rails and 4 steps in the back with 2 rails - plan to get railings installed on front steps Entrance Stairs-Rails: Right;Left;Can reach both (on back of house) Home Layout: One level Bathroom Shower/Tub: Multimedia programmer: Handicapped height Bathroom Accessibility: Yes  Lives With: Alone Prior Function Level of  Independence: Independent with basic ADLs;Independent with transfers;Independent with homemaking with ambulation;Independent with gait  Able to Take Stairs?: Yes Driving: Yes Vocation: Retired Vision/Perception  Vision - History Ability to See in Adequate Light: 0 Adequate Perception Perception: Within Functional Limits Praxis Praxis: Intact  Cognition Overall Cognitive Status: Within Functional Limits for tasks assessed Arousal/Alertness: Awake/alert Orientation Level: Oriented X4 Memory: Appears  intact Awareness: Appears intact Problem Solving: Appears intact Safety/Judgment: Appears intact Sensation Sensation Light Touch: Appears Intact Proprioception: Appears Intact Coordination Gross Motor Movements are Fluid and Coordinated: No Fine Motor Movements are Fluid and Coordinated: Yes Coordination and Movement Description: mild incoordination due to pain, body habitus with wide BOS, and generalized weakness/deconditioning Finger Nose Finger Test: [facility] bilaterally Heel Shin Test: decreased ROM bilaterally due to body habitus Motor  Motor Motor: Within Functional Limits Motor - Skilled Clinical Observations: generalized weakness/deconditioning  Trunk/Postural Assessment  Cervical Assessment Cervical Assessment: Exceptions to [facility] (forward head) Thoracic Assessment Thoracic Assessment: Exceptions to [facility] (mild kyphosis) Lumbar Assessment Lumbar Assessment: Exceptions to [facility] (anterior pelvic tilt due to body habitus) Postural Control Postural Control: Within Functional Limits  Balance Balance Balance Assessed: Yes Static Sitting Balance Static Sitting - Balance Support: Feet supported;Bilateral upper extremity supported Static Sitting - Level of Assistance: 6: Modified independent (Device/Increase time) Dynamic Sitting Balance Dynamic Sitting - Balance Support: Feet supported;No upper extremity supported Dynamic Sitting - Level of Assistance: 5: Stand by assistance  (supervision) Static Standing Balance Static Standing - Balance Support: No upper extremity supported Static Standing - Level of Assistance: 5: Stand by assistance (CGA) Dynamic Standing Balance Dynamic Standing - Balance Support: No upper extremity supported Dynamic Standing - Level of Assistance: 4: Min assist Dynamic Standing - Comments: with gait Extremity Assessment  RLE Assessment RLE Assessment: Exceptions to [facility] RLE Strength Right Hip Flexion: 3+/5 Right Hip ABduction: 4-/5 Right Hip ADduction: 4-/5 Right Knee Flexion: 3+/5 Right Knee Extension: 3+/5 Right Ankle Dorsiflexion: 3+/5 Right Ankle Plantar Flexion: 4-/5 LLE Assessment LLE Assessment: Exceptions to [facility] LLE Strength Left Hip Flexion: 3+/5 Left Hip ABduction: 4-/5 Left Hip ADduction: 4-/5 Left Knee Flexion: 3+/5 Left Knee Extension: 4-/5 Left Ankle Dorsiflexion: 3+/5 Left Ankle Plantar Flexion: 4-/5  Care Tool Care Tool Bed Mobility Roll left and right activity        Sit to lying activity        Lying to sitting on side of bed activity         Care Tool Transfers Sit to stand transfer   Sit to stand assist level: Contact Guard/Touching assist    Chair/bed transfer   Chair/bed transfer assist level: Contact Guard/Touching assist     Physiological scientist transfer assist level: Minimal Assistance - Patient > 75%      Care Tool Locomotion Ambulation   Assist level: Minimal Assistance - Patient > 75% Assistive device: Other (comment) (L handrail) Max distance: 65f  Walk 10 feet activity   Assist level: Minimal Assistance - Patient > 75% Assistive device: Other (comment) (L handrail)   Walk 50 feet with 2 turns activity Walk 50 feet with 2 turns activity did not occur: Safety/medical concerns (fatigue, R foot pain, weakness)      Walk 150 feet activity Walk 150 feet activity did not occur: Safety/medical concerns (fatigue, R foot pain, weakness)      Walk 10 feet  on uneven surfaces activity Walk 10 feet on uneven surfaces activity did not occur: Safety/medical concerns (fatigue, R foot pain, weakness)      Stairs Stair activity did not occur: Safety/medical concerns (fatigue, R foot pain, weakness)        Walk up/down 1 step activity Walk up/down 1 step or curb (drop down) activity did not occur: Safety/medical concerns (fatigue, R foot pain, weakness)      Walk up/down 4  steps activity Walk up/down 4 steps activity did not occur: Safety/medical concerns (fatigue, R foot pain, weakness)      Walk up/down 12 steps activity Walk up/down 12 steps activity did not occur: Safety/medical concerns (fatigue, R foot pain, weakness)      Pick up small objects from floor Pick up small object from the floor (from standing position) activity did not occur: Safety/medical concerns (weakness and body habitus)      Wheelchair Is the patient using a wheelchair?: Yes Type of Wheelchair: Manual   Wheelchair assist level: Supervision/Verbal cueing Max wheelchair distance: 31f  Wheel 50 feet with 2 turns activity   Assist Level: Total Assistance - Patient < 25%  Wheel 150 feet activity   Assist Level: Dependent - Patient 0%    Refer to Care Plan for Long Term Goals  SHORT TERM GOAL WEEK 1 PT Short Term Goal 1 (Week 1): STG=LTG due to LOS  Recommendations for other services: None   Skilled Therapeutic Intervention Evaluation completed (see details above and below) with education on PT POC and goals and individual treatment initiated with focus on functional mobility/transfers, generalized strengthening, dynamic standing balance/coordination, ambulation, and improved activity tolerance. Received pt sitting in recliner, pt educated on PT evaluation, CIR policies, and therapy schedule and agreeable. Pt reported pain 6/10 in R foot and buttocks - RN notified and present to administer pain medication and MD arrived briefly for morning rounds. Pt on 2L O2 via [place]  with O2 sat 93% at rest. Pt transferred recliner<>WC stand<>pivot without AD and CGA. Pt performed WC mobility 274fusing BUE and supervision - limited due to fatigue. Pt then transferred sit<>stand with L handrail and CGA and ambulated 2083fith L handrail and min A with total A to manage O2 tank. O2 sat 88% increasing to 94% with 1-2 minute seated rest break. [facility] and requested to return to recliner. Pt ambulated additional 5ft53fthout AD and min A to recliner and left with all needs within reach.   Mobility Transfers Transfers: Sit to Stand;Stand to Sit;Stand Pivot Transfers Sit to Stand: Contact Guard/Touching assist Stand to Sit: Contact Guard/Touching assist Stand Pivot Transfers: Minimal Assistance - Patient > 75% Transfer (Assistive device): None Locomotion  Gait Ambulation: Yes Gait Assistance: Minimal Assistance - Patient > 75% Gait Distance (Feet): 20 Feet Assistive device: Other (Comment) (L handrail) Gait Assistance Details: Verbal cues for technique Gait Assistance Details: verbal cues for turning technique Gait Gait: Yes Gait Pattern: Impaired Gait Pattern: Step-to pattern;Decreased step length - right;Decreased step length - left;Decreased stride length;Decreased weight shift to right;Poor foot clearance - left;Poor foot clearance - right;Wide base of support Gait velocity: decreased Wheelchair Mobility Wheelchair Mobility: Yes Wheelchair Assistance: SupeChartered loss adjusterth upper extremities Wheelchair Parts Management: Needs assistance Distance: 25ft51fischarge Criteria: Patient will be discharged from PT if patient refuses treatment 3 consecutive times without medical reason, if treatment goals not met, if there is a change in medical status, if patient makes no progress towards goals or if patient is discharged from hospital.  The above assessment, treatment plan, treatment alternatives and  goals were discussed and mutually agreed upon: by patient  Sophi Calligan Alfonse AlpersDPT  03/05/2021, 12:25 PM

## 2021-03-05 NOTE — Evaluation (Signed)
Occupational Therapy Assessment and Plan  Patient Details  Name: Nicole Schmidt MRN: [id number] Date of Birth: [birth year]  OT Diagnosis: abnormal posture, acute pain, muscle weakness (generalized), and decreased activity tolerance, dynamic standing balance Rehab Potential: Rehab Potential (ACUTE ONLY): Good ELOS: 5-7 days   Today's Date: 03/05/2021 OT Individual Time: 0702-0810 OT Individual Time Calculation (min): 68 min     Hospital Problem: Principal Problem:   Debility   Past Medical History:  Past Medical History:  Diagnosis Date   (HFpEF) heart failure with preserved ejection fraction ([place])    a. 10/2018 Echo: EF 60-65%, diast dysfxn, RVSP 50.7 mmHg, mildly dil LA.   Acquired hypothyroidism 02/09/2020   Anemia    Arthritis    Breast cancer ([place]) 2014   right breast cancer   Breast cancer of upper-inner quadrant of right female breast ([place])    Right breast invasive CA and DCIS , 7 mm T1,N0,M0. Er/PR pos, her 2 negative.  Margins 1 mm.   CHF (congestive heart failure) (HCC)    CKD (chronic kidney disease), stage III (HCC)    Diabetes mellitus type 2 in obese ([place]) 02/09/2020   Diabetes mellitus without complication ([place])    Dyslipidemia 02/09/2020   Essential hypertension    Gout    Hypothyroidism    Menopause    age 67   Morbid obesity ([place])    Persistent atrial fibrillation ([place])    a. Dx 10/2018 in setting of PNA. CHA2DS2VASc = 4-->Eliquis; b. 11/2018 s/p successful DCCV.   Personal history of radiation therapy    Pulmonary embolism ([place]) 10/2014   a. Chronic eliquis.   Thyroid goiter    Past Surgical History:  Past Surgical History:  Procedure Laterality Date   BREAST BIOPSY Right 2014   breast ca   BREAST EXCISIONAL BIOPSY Right 07/19/2012   breast ca   BREAST SURGERY Right 2014   with sentinel node bx subareaolar duct excision   CARDIOVERSION N/A 11/28/2018   Procedure: CARDIOVERSION;  Surgeon: Minna Merritts, MD;  Location: [facility] ORS;  Service:  Cardiovascular;  Laterality: N/A;   COLONOSCOPY WITH PROPOFOL N/A 01/30/2017   Procedure: COLONOSCOPY WITH PROPOFOL;  Surgeon: Christene Lye, MD;  Location: [facility] ENDOSCOPY;  Service: Endoscopy;  Laterality: N/A;   CYSTOSCOPY W/ URETERAL STENT PLACEMENT Left 02/14/2021   Procedure: CYSTOSCOPY WITH RETROGRADE PYELOGRAM/URETERAL STENT PLACEMENT;  Surgeon: Billey Co, MD;  Location: [facility] ORS;  Service: Urology;  Laterality: Left;   DIALYSIS/PERMA CATHETER INSERTION N/A 02/24/2021   Procedure: DIALYSIS/PERMA CATHETER INSERTION;  Surgeon: Algernon Huxley, MD;  Location: [place] [facility];  Service: Cardiovascular;  Laterality: N/A;    Assessment & Plan Clinical Impression: Patient is a 56 y.o. year old female history of chronic hypoxemic respiratory failure on 2 L oxygen, diabetes mellitus, breast cancer, history of hematuria left ureteral stone with recent left uteroscopy with laser lithotripsy and stent exchange ureteral stent per Dr.Sninsky, diabetes mellitus, hypertension, hyperlipidemia, gout, morbid obesity with BMI 62.17, atrial fibrillation/pulmonary emboli maintained on Eliquis.  Recent admission 02/01/2021 - 02/22/2021 for management of acute on chronic diastolic congestive heart failure and AKI superimposed on CKD stage III.  Per chart review patient lives alone.  1 level home 2 steps to entry.  Reports independent without assistive device prior to admission.  Presented to [facility] 02/23/2021 ER with increasing lower extremity edema and increasing shortness of breath and diminished urine output.  Chest x-ray showed cardiomegaly subtle prominence of interstitial markings in the  perihilar regions more so on the right side possibly suggesting mild asymmetric interstitial pulmonary edema.  Noted interval decrease in patchy infiltrates in the perihilar regions and lower lung fields suggesting resolving atelectasis/pneumonia.  Admission chemistries glucose 128 BUN 136 creatinine  5.82, WBC 16,600 hemoglobin 8.8, troponin 10, BNP 375.  Follow-up nephrology services permacath was placed 02/25/2021 hemodialysis initiated for ESRD and fluid overload and latest creatinine 4.39.  She remains on chronic Eliquis for history of atrial fibrillation pulmonary emboli.  Therapy evaluations completed due to patient decreased functional mobility was admitted for a comprehensive rehab program.Currently complains of constipation. .  Patient transferred to CIR on 03/04/2021 .    Patient currently requires supervision with basic self-care skills secondary to muscle weakness, decreased cardiorespiratoy endurance, and decreased standing balance, decreased postural control, and decreased balance strategies.  Prior to hospitalization, patient could complete BADL/IADL/mobility with independent .  Patient will benefit from skilled intervention to increase independence with basic self-care skills and increase level of independence with iADL prior to discharge home independently.  Anticipate patient will require  no S  and no further OT follow recommended.  OT - End of Session Activity Tolerance: Tolerates 30+ min activity with multiple rests Endurance Deficit: Yes Endurance Deficit Description: required increased time and breaks to complete ADL OT Assessment Rehab Potential (ACUTE ONLY): Good OT Barriers to Discharge: Decreased caregiver support;Inaccessible home environment;Home environment access/layout;Weight OT Patient demonstrates impairments in the following area(s): Balance;Endurance;Motor;Pain OT Basic ADL's Functional Problem(s): Grooming;Bathing;Dressing;Toileting OT Advanced ADL's Functional Problem(s): Simple Meal Preparation;Light Housekeeping OT Transfers Functional Problem(s): Toilet;Tub/Shower OT Additional Impairment(s): None OT Plan OT Intensity: Minimum of 1-2 x/day, 45 to 90 minutes OT Frequency: 5 out of 7 days OT Duration/Estimated Length of Stay: 5-7 days OT Self Feeding  Anticipated Outcome(s): ind OT Basic Self-Care Anticipated Outcome(s): mod I OT Toileting Anticipated Outcome(s): mod I OT Bathroom Transfers Anticipated Outcome(s): mod I OT Recommendation Patient destination: Home Follow Up Recommendations: None Equipment Recommended: To be determined   OT Evaluation Precautions/Restrictions  Precautions Precautions: Fall Precaution Comments: 3L O2 Restrictions Weight Bearing Restrictions: No General Chart Reviewed: Yes Response to Previous Treatment: Not applicable Family/Caregiver Present: No  Pain   Reports "I wasn't thinking about it" re ongoing R foot pain Home Living/Prior [place] expects to be discharged to:: Private residence Living Arrangements: Alone Available Help at Discharge: Family, Friend(s), Available PRN/intermittently Type of Home: House (going back to her house in [place]) Home Access: Stairs to enter [employer] of Steps: 2 steps in front with no rails and 4 steps in the back with 2 rails - plan to get railings installed on front steps Entrance Stairs-Rails: Right, Left, Can reach both (on back of house) Home Layout: One level Bathroom Shower/Tub: Multimedia programmer: Handicapped height Bathroom Accessibility: Yes  Lives With: Alone IADL History Homemaking Responsibilities: Yes Meal Prep Responsibility: Primary Laundry Responsibility: Primary Cleaning Responsibility: Primary Bill Paying/Finance Responsibility: Primary Shopping Responsibility: Primary Leisure and Hobbies: watching TV, going to concerts Prior Function Level of Independence: Independent with basic ADLs, Independent with transfers, Independent with homemaking with ambulation, Independent with gait  Able to Take Stairs?: Yes Driving: Yes Vocation: Retired Surveyor, mining Baseline Vision/History: 1 Wears glasses Ability to See in Adequate Light: 0 Adequate Patient Visual Report: No change from  baseline Vision Assessment?: No apparent visual deficits Perception  Perception: Within Functional Limits Praxis Praxis: Intact Cognition Overall Cognitive Status: Within Functional Limits for tasks assessed Arousal/Alertness: Awake/alert Orientation Level: Person;Place;Situation Person: Oriented Place:  Oriented Situation: Oriented Year: 2023 Month: January Day of Week: Correct Memory: Appears intact Immediate Memory Recall: Sock;Blue;Bed Memory Recall Sock: Without Cue Memory Recall Blue: Without Cue Memory Recall Bed: Without Cue Awareness: Appears intact Problem Solving: Appears intact Safety/Judgment: Appears intact Sensation Sensation Light Touch: Appears Intact Hot/Cold: Appears Intact Proprioception: Appears Intact Stereognosis: Appears Intact Coordination Gross Motor Movements are Fluid and Coordinated: No Fine Motor Movements are Fluid and Coordinated: Yes Coordination and Movement Description: mild incoordination due to pain, body habitus with wide BOS, and generalized weakness/deconditioning Finger Nose Finger Test: [facility] bilaterally Heel Shin Test: decreased ROM bilaterally due to body habitus Motor  Motor Motor: Within Functional Limits Motor - Skilled Clinical Observations: generalized weakness/deconditioning  Trunk/Postural Assessment  Cervical Assessment Cervical Assessment: Exceptions to [facility] (forward head) Thoracic Assessment Thoracic Assessment: Exceptions to [facility] (mild kyphosis) Lumbar Assessment Lumbar Assessment: Exceptions to [facility] (anterior pelvic tilt due to body habitus) Postural Control Postural Control: Within Functional Limits  Balance Balance Balance Assessed: Yes Static Sitting Balance Static Sitting - Balance Support: Feet supported;Bilateral upper extremity supported Static Sitting - Level of Assistance: 6: Modified independent (Device/Increase time) Dynamic Sitting Balance Dynamic Sitting - Balance Support: Feet supported;No upper  extremity supported Dynamic Sitting - Level of Assistance: 5: Stand by assistance Static Standing Balance Static Standing - Balance Support: No upper extremity supported Static Standing - Level of Assistance: 5: Stand by assistance Dynamic Standing Balance Dynamic Standing - Balance Support: Bilateral upper extremity supported Dynamic Standing - Level of Assistance: 5: Stand by assistance Dynamic Standing - Comments: with gait Extremity/Trunk Assessment RUE Assessment RUE Assessment: Within Functional Limits LUE Assessment LUE Assessment: Within Functional Limits  Care Tool Care Tool Self Care Eating   Eating Assist Level: Independent    Oral Care    Oral Care Assist Level: Supervision/Verbal cueing    Bathing   Body parts bathed by patient: Face     Assist Level: Supervision/Verbal cueing    Upper Body Dressing(including orthotics)   What is the patient wearing?: Pull over shirt   Assist Level: Independent    Lower Body Dressing (excluding footwear)   What is the patient wearing?: Pants Assist for lower body dressing: Supervision/Verbal cueing    Putting on/Taking off footwear   What is the patient wearing?: Non-skid slipper socks;Ted hose Assist for footwear: Total Assistance - Patient < 25%       Care Tool Toileting Toileting activity Toileting Activity did not occur (Clothing management and hygiene only): N/A (no void or bm)       Care Tool Bed Mobility Roll left and right activity        Sit to lying activity        Lying to sitting on side of bed activity         Care Tool Transfers Sit to stand transfer   Sit to stand assist level: Supervision/Verbal cueing    Chair/bed transfer   Chair/bed transfer assist level: Supervision/Verbal cueing     Toilet transfer   Assist Level: Supervision/Verbal cueing     Care Tool Cognition  Expression of Ideas and Wants Expression of Ideas and Wants: 4. Without difficulty (complex and basic) - expresses  complex messages without difficulty and with speech that is clear and easy to understand  Understanding Verbal and Non-Verbal Content Understanding Verbal and Non-Verbal Content: 4. Understands (complex and basic) - clear comprehension without cues or repetitions   Memory/Recall Ability Memory/Recall Ability : Current season;Location of own room;Staff names and faces;That  he or she is in a hospital/hospital unit   Refer to [place] for [place] 1 OT Short Term Goal 1 (Week 1): STG = LTG 2/2 ELOS  Recommendations for other services: None    Skilled Therapeutic Intervention ADL ADL Eating: Independent Where Assessed-Eating: Chair Grooming: Supervision/safety Where Assessed-Grooming: Standing at sink Upper Body Bathing: Independent Where Assessed-Upper Body Bathing: Sitting at sink Lower Body Bathing: Supervision/safety Where Assessed-Lower Body Bathing: Standing at sink Upper Body Dressing: Independent Where Assessed-Upper Body Dressing: Chair Lower Body Dressing: Supervision/safety Where Assessed-Lower Body Dressing: Chair Toileting: Supervision/safety Where Assessed-Toileting: Glass blower/designer: Close supervision Toilet Transfer Method: Counselling psychologist: Energy manager: Not assessed Social research officer, government: Close supervision Social research officer, government Method: Heritage manager: Grab bars Mobility  [employer] Mobility: Not assessed Transfers Sit to Stand: Supervision/Verbal cueing Stand to Sit: Supervision/Verbal cueing  Session Note: Pt received seated in recliner finishing breakfast, no initial c/o pain and agreeable to OT eval. Reviewed role of CIR OT, evaluation process, ADL/func mobility retraining, goals for therapy, and safety plan. Evaluation completed as documented above. Seated, SatO2 at 94% on 3L via [place], dropped to 87% post activity but recovered within 1.5 min to 95% with  PLB. Pt completed LB dressing, simulated toilet transfer, and grooming in stance at the S level with bari RW. Able to simulate walk in shower transfer at S level, as well with use of grab bars and via lateral stepping. Discussed home set-up and available DME, need to elevate BLE to decrease edema, and personal rehab goals. Pt left seated in recliner with call bell in reach, and all immediate needs met.  Discharge Criteria: Patient will be discharged from OT if patient refuses treatment 3 consecutive times without medical reason, if treatment goals not met, if there is a change in medical status, if patient makes no progress towards goals or if patient is discharged from hospital.  The above assessment, treatment plan, treatment alternatives and goals were discussed and mutually agreed upon: by patient  Volanda Napoleon MS, OTR/L  03/05/2021, 8:13 AM

## 2021-03-06 DIAGNOSIS — R5381 Other malaise: Principal | ICD-10-CM

## 2021-03-06 LAB — GLUCOSE, CAPILLARY
Glucose-Capillary: 89 mg/dL (ref 70–99)
Glucose-Capillary: 129 mg/dL — ABNORMAL HIGH (ref 70–99)
Glucose-Capillary: 124 mg/dL — ABNORMAL HIGH (ref 70–99)
Glucose-Capillary: 108 mg/dL — ABNORMAL HIGH (ref 70–99)

## 2021-03-06 NOTE — Progress Notes (Signed)
[facility] Progress Note  Subjective: seen in room, we got 3 L off yest in HD (but not recorded formally as such). No new c/o's.   Vitals:   03/05/21 1843 03/05/21 2033 03/06/21 0411 03/06/21 0504  BP: (!) 150/73 (!) 124/58 130/64   Pulse: 75 82 78   Resp: 16 18 18    Temp: 97.6 F (36.4 C) 98.1 F (36.7 C) 98.4 F (36.9 C)   TempSrc:   Oral   SpO2: 96% 97% 98%   Weight:    (!) 163 kg  Height:        Exam:   alert, nad , obese  no jvd  Chest cta bilat  Cor reg no RG  Abd soft ntnd no ascites   Ext diffuse 2+ bilat LE edema   Alert, NF, ox3   RIJ TDC in place   Assessment/ Plan: Acute Kidney Injury on chronic kidney disease stage IV: baseline creatinine of 2.43 in Nov 2022. Dialysis initiated on 02/25/21. Arranged for outpatient HD at [facility] on TTS schedule. Transferred [place] to CIR at [facility] for rehab.  HD on TTS schedule here. Short extra HD Monday for vol overload, then resume TTS on 1/24.   Hypertension w/ chronic kidney disease: On amlodipine, clonidine and metoprolol here. BP stable. Sig volume overload w/ diffuse LE edema. High UF goals w/ HD.  Anemia of chronic kidney disease: Iron levels consistent with iron deficiency. SP prbc's.  Hematuria: Ureteral stent placed by urology on 02/14/2021.  With recent episode of nephrolithiasis.  No further reports of hematuria reports Secondary Hyperparathyroidism: PTH on 12/30/20 was 72. Currently without binders. Phosphorus 4.7. Continue calcitriol and ergocalciferol.  Acute respiratory failure: increased O2 needed initially from her baseline of 2 liters. Weaned to 2L now. Still vol overload.      Rob Janai Brannigan 03/06/2021, 12:45 PM   Recent Labs  Lab 03/01/21 0719 03/03/21 1023 03/04/21 0746 03/05/21 1017  K 4.4 3.3* 3.9 3.9  BUN 62* 44* 58* 70*  CREATININE 4.39* 3.98* 5.00* 6.04*  CALCIUM 8.3* 7.6* 7.8* 8.4*  PHOS 4.4 4.7*  --   --   HGB 9.3* 7.4* 6.9* 7.5*    Inpatient medications:   amLODipine  5 mg Oral BID   apixaban  5 mg Oral BID   atorvastatin  20 mg Oral QHS   calcitRIOL  0.25 mcg Oral Daily   calcium carbonate  500 mg of elemental calcium Oral Q breakfast   Chlorhexidine Gluconate Cloth  6 each Topical Daily   cloNIDine  0.2 mg Oral BID   [START ON 03/07/2021] Darbepoetin Alfa  25 mcg Intravenous Once per day on Mon Wed Fri   folic acid  1,027 mcg Oral Daily   gabapentin  300 mg Oral QHS   hydrocerin   Topical BID   insulin aspart  0-9 Units Subcutaneous TID AC & HS   levothyroxine  112 mcg Oral QAC breakfast   metoprolol tartrate  100 mg Oral BID   [START ON 03/11/2021] Vitamin D (Ergocalciferol)  50,000 Units Oral Q Fri    acetaminophen **OR** acetaminophen, albuterol, bisacodyl, melatonin, ondansetron **OR** ondansetron (ZOFRAN) IV, traZODone

## 2021-03-06 NOTE — Discharge Instructions (Addendum)
Inpatient Rehab Discharge Instructions  PHINLEY SCHALL Discharge date and time: No discharge date for patient encounter.   Activities/Precautions/ Functional Status: Activity: As tolerated Diet: Renal diet/1200 mL fluid restriction Wound Care: Routine skin checks Functional status:  ___ No restrictions     ___ Walk up steps independently ___ 24/7 supervision/assistance   ___ Walk up steps with assistance ___ Intermittent supervision/assistance  ___ Bathe/dress independently ___ Walk with walker     _x__ Bathe/dress with assistance ___ Walk Independently    ___ Shower independently ___ Walk with assistance    ___ Shower with assistance ___ No alcohol     ___ Return to work/school ________  COMMUNITY REFERRALS UPON DISCHARGE:    Home Health:   PT       RN                   Agency: [facility], clinical Phone:      Special Instructions: No driving smoking or alcohol  Continue hemodialysis as directed   My questions have been answered and I understand these instructions. I will adhere to these goals and the provided educational materials after my discharge from the hospital.  Patient/Caregiver Signature _______________________________ Date __________  Clinician Signature _______________________________________ Date __________  Please bring this form and your medication list with you to all your follow-up doctor's appointments.

## 2021-03-06 NOTE — Progress Notes (Signed)
RT spoke to patient about wearing her CPAP.  Patient has home unit at bedside and stated she doesn't need any assistance in applying her mask.  RT advised patient to call if she needs any assistance.

## 2021-03-06 NOTE — Progress Notes (Signed)
PROGRESS NOTE   Subjective/Complaints: No new complaints this morning Pain and insomnia improved Asks about her creatinine level- discussed 6.04. Had a lot of fluid removed yesterday  ROS: foot pain improved  Objective:   No results found. Recent Labs    03/04/21 0746 03/05/21 1017  WBC 12.8* 12.1*  HGB 6.9* 7.5*  HCT 23.5* 25.5*  PLT 252 250   Recent Labs    03/04/21 0746 03/05/21 1017  NA 135 136  K 3.9 3.9  CL 99 98  CO2 26 24  GLUCOSE 109* 141*  BUN 58* 70*  CREATININE 5.00* 6.04*  CALCIUM 7.8* 8.4*    Intake/Output Summary (Last 24 hours) at 03/06/2021 1355 Last data filed at 03/06/2021 1131 Gross per 24 hour  Intake 240 ml  Output 100 ml  Net 140 ml        Physical Exam: Vital Signs Blood pressure 111/62, pulse 63, temperature 98.2 F (36.8 C), resp. rate 18, height 5\' 4"  (1.626 m), weight (!) 163 kg, last menstrual period 07/17/2012, SpO2 95 %. Gen: no distress, normal appearing HEENT: oral mucosa pink and moist, nasal cannula in place Cardio: Irregular Chest: normal effort, normal rate of breathing, diminished breath sounds bilaterally Abd: soft, non-distended Ext: 1+ bilateral lower extremity edema Psych: pleasant, normal affect Skin: RIJ permacath C/D/I, dry and scaly skin on bilateral feet. Neurological:     Comments: Patient is alert.  No acute distress.  Mood is a bit flat but appropriate.  Follows commands.  Oriented x3. Allodynia over lateral aspect of right foot- improved. Decreased sensation bilateral feet   Assessment/Plan: 1. Functional deficits which require 3+ hours per day of interdisciplinary therapy in a comprehensive inpatient rehab setting. Physiatrist is providing close team supervision and 24 hour management of active medical problems listed below. Physiatrist and rehab team continue to assess barriers to discharge/monitor patient progress toward functional and medical  goals  Care Tool:  Bathing    Body parts bathed by patient: Face         Bathing assist Assist Level: Supervision/Verbal cueing     Upper Body Dressing/Undressing Upper body dressing   What is the patient wearing?: Hospital gown only    Upper body assist Assist Level: Independent    Lower Body Dressing/Undressing Lower body dressing      What is the patient wearing?: Hospital gown only     Lower body assist Assist for lower body dressing: Supervision/Verbal cueing     Toileting Toileting Toileting Activity did not occur (Clothing management and hygiene only): N/A (no void or bm)  Toileting assist Assist for toileting: Contact Guard/Touching assist     Transfers Chair/bed transfer  Transfers assist     Chair/bed transfer assist level: Contact Guard/Touching assist     Locomotion Ambulation   Ambulation assist      Assist level: Minimal Assistance - Patient > 75% Assistive device: Other (comment) (L handrail) Max distance: 22ft   Walk 10 feet activity   Assist     Assist level: Minimal Assistance - Patient > 75% Assistive device: Other (comment) (L handrail)   Walk 50 feet activity   Assist Walk 50 feet with 2 turns  activity did not occur: Safety/medical concerns (fatigue, R foot pain, weakness)         Walk 150 feet activity   Assist Walk 150 feet activity did not occur: Safety/medical concerns (fatigue, R foot pain, weakness)         Walk 10 feet on uneven surface  activity   Assist Walk 10 feet on uneven surfaces activity did not occur: Safety/medical concerns (fatigue, R foot pain, weakness)         Wheelchair     Assist Is the patient using a wheelchair?: Yes Type of Wheelchair: Manual    Wheelchair assist level: Supervision/Verbal cueing Max wheelchair distance: 57ft    Wheelchair 50 feet with 2 turns activity    Assist        Assist Level: Total Assistance - Patient < 25%   Wheelchair 150 feet  activity     Assist      Assist Level: Dependent - Patient 0%   Blood pressure 111/62, pulse 63, temperature 98.2 F (36.8 C), resp. rate 18, height 5\' 4"  (1.626 m), weight (!) 163 kg, last menstrual period 07/17/2012, SpO2 95 %.    Medical Problem List and Plan: 1. Functional deficits/debility secondary to acute on chronic renal failure superimposed with diastolic congestive heart failure             -patient may shower             -ELOS/Goals: 10-14 days modI             -Continue CIR 2.  Antithrombotics: -DVT/anticoagulation:  Pharmaceutical: Other (comment) Eliquis             -antiplatelet therapy: N/A 3. Right foot pain: Tylenol as needed. D/c Dilaudid, Add gabapentin HS 4. Insomnia: add Gabapentin HS. Melatonin as needed             -antipsychotic agents: N/A 5. Neuropsych: This patient is capable of making decisions on her own behalf. 6. Skin/Wound Care: Routine skin checks 7. Fluids/Electrolytes/Nutrition: Routine in and outs with follow-up chemistries 8.  End-stage renal disease.  Permacath placed 02/25/2021 hemodialysis initiated. Discussed recent creatinine level with her 9.  History of hematuria.  Left ureteral stone with recent uteroscopy/laser lithotripsy.  Follow-up urology Dr Diamantina [place] 10.  Hypothyroidism.  Synthroid 11.  Diabetes mellitus.  Hemoglobin A1c 7.2.  SSI. 12.  Hypothyroidism.  Synthroid 13.  Hypertension.  Clonidine 0.2 mg twice daily, Norvasc 5 mg twice daily.  Monitor with increased mobility 14.  Hyperlipidemia.  Lipitor 15.  Atrial fibrillation/history of pulmonary emboli.  Continue Eliquis.  Lopressor 100 mg twice daily 16.  Acute on chronic anemia.  Transfuse 1 unit RBCs 03/04/2021.  Follow-up CBC 17.  Obesity.  BMI 62.17.  Dietary follow-up 18.  Chronic hypoxemic respiratory failure.  Continue oxygen therapy as directed as well as CPAP 19. Constipation: discussed trying suppository  20. Dry skin: order Eucerin cream.    LOS: 2 days A FACE TO  FACE EVALUATION WAS PERFORMED  Martha Clan P Krishay Faro 03/06/2021, 1:55 PM

## 2021-03-07 LAB — GLUCOSE, CAPILLARY
Glucose-Capillary: 98 mg/dL (ref 70–99)
Glucose-Capillary: 92 mg/dL (ref 70–99)
Glucose-Capillary: 113 mg/dL — ABNORMAL HIGH (ref 70–99)
Glucose-Capillary: 129 mg/dL — ABNORMAL HIGH (ref 70–99)

## 2021-03-07 MED ORDER — NA FERRIC GLUC CPLX IN SUCROSE 12.5 MG/ML IV SOLN
250.0000 mg | Freq: Every day | INTRAVENOUS | Status: DC
  Administered 2021-03-07 – 2021-03-09 (×3): 250 mg via INTRAVENOUS
  Filled 2021-03-07 (×5): qty 20

## 2021-03-07 NOTE — Progress Notes (Signed)
[name] Progress Note  Subjective: Seen in room. Working with therapy. Endorses some continue DOE.   Vitals:   03/06/21 2219 03/07/21 0517 03/07/21 0525 03/07/21 0740  BP:  131/88  (!) 155/59  Pulse: 79 68  81  Resp: 16 18  16   Temp:  97.8 F (36.6 C)  (!) 97.4 F (36.3 C)  TempSrc:  Oral  Oral  SpO2: 98% 97%  97%  Weight:   (!) 164.3 kg   Height:        Exam:   alert, nad , obese  no jvd  Chest cta bilat  Cor reg no RG  Abd soft ntnd no ascites   Ext diffuse 2+ bilat LE edema   Alert, NF, ox3   RIJ TDC in place   Assessment/ Plan: Acute Kidney Injury on chronic kidney disease stage IV: baseline creatinine of 2.43 in Nov 2022. Dialysis initiated on 02/25/21. Arranged for outpatient HD at [facility] on TTS schedule. Transferred [place] to CIR at [facility] for rehab.  HD on TTS schedule here. Has orders for extra HD today, but may be pushed to tomorrow d/t staffing. Usual HD Tuesday.  Hypertension w/ chronic kidney disease: On amlodipine, clonidine and metoprolol here. BP stable. Sig volume overload w/ diffuse LE edema. High UF goals w/ HD.  Anemia of chronic kidney disease: Hgb 7.5. s/p prbcs. Tsat 11 Ferritin 273 -will replete iron here.    Aranesp 25 q week ordered today. Hematuria: Ureteral stent placed by urology on 02/14/2021.  With recent episode of nephrolithiasis.  No further reports of hematuria reports Secondary Hyperparathyroidism: PTH on 12/30/20 was 72. Currently without binders. Phosphorus 4.7. Continue calcitriol and ergocalciferol.  Acute respiratory failure: increased O2 needed initially from her baseline of 2 liters. Weaned to 2L now. Still vol overload.    Nicole Child PA-C [facility] 03/07/2021,1:54 PM  Recent Labs  Lab 03/01/21 0719 03/03/21 1023 03/04/21 0746 03/05/21 1017  K 4.4 3.3* 3.9 3.9  BUN 62* 44* 58* 70*  CREATININE 4.39* 3.98* 5.00* 6.04*  CALCIUM 8.3* 7.6* 7.8* 8.4*  PHOS 4.4 4.7*  --   --    HGB 9.3* 7.4* 6.9* 7.5*    Inpatient medications:  amLODipine  5 mg Oral BID   apixaban  5 mg Oral BID   atorvastatin  20 mg Oral QHS   calcitRIOL  0.25 mcg Oral Daily   calcium carbonate  500 mg of elemental calcium Oral Q breakfast   Chlorhexidine Gluconate Cloth  6 each Topical Daily   cloNIDine  0.2 mg Oral BID   Darbepoetin Alfa  25 mcg Intravenous Once per day on Mon Wed Fri   folic acid  2,585 mcg Oral Daily   gabapentin  300 mg Oral QHS   hydrocerin   Topical BID   insulin aspart  0-9 Units Subcutaneous TID AC & HS   levothyroxine  112 mcg Oral QAC breakfast   metoprolol tartrate  100 mg Oral BID   [START ON 03/11/2021] Vitamin D (Ergocalciferol)  50,000 Units Oral Q Fri    acetaminophen **OR** acetaminophen, albuterol, bisacodyl, melatonin, ondansetron **OR** ondansetron (ZOFRAN) IV, traZODone

## 2021-03-07 NOTE — Consult Note (Signed)
Renal Service Consult Note [facility]  Nicole Schmidt 03/07/2021 Sol Blazing, MD Requesting Physician: Dr Ranell Patrick  Reason for Consult: ESRD pt, new start, transferred to [facility] for rehab HPI: The patient is a 56 y.o. year-old w/ hx of anemia, breast cancer, CKD, DM2, HTN, gout, obesity, hx PE, atrial fib who was admitted at [facility] for acute on chronic renal failure stage IV, a/c diast CHF, OSA, DM2, ACHRF and atrial fib.  Pt was started on hemodialysis w/ TDC placement on 02/25/21. Received prbc's x 1 and was seen by OT/ PT and deemed a got candidate for CIR. Pt transferred from [facility] on 1/20 for admit to CIR at [facility].  We are asked to see for ESRD.    Pt seen in room.  Main issue is bilat LE edema. Has improved just a bit since starting dialysis at [facility] this past week or so. No orthopnea or prod cough. No CP or abd pain , no n/v/d. Up in chair.    ROS - denies CP, no joint pain, no HA, no blurry vision, no rash, no diarrhea, no nausea/ vomiting, no dysuria, no difficulty voiding   Past Medical History  Past Medical History:  Diagnosis Date   (HFpEF) heart failure with preserved ejection fraction ([place])    a. 10/2018 Echo: EF 60-65%, diast dysfxn, RVSP 50.7 mmHg, mildly dil LA.   Acquired hypothyroidism 02/09/2020   Anemia    Arthritis    Breast cancer ([place]) 2014   right breast cancer   Breast cancer of upper-inner quadrant of right female breast ([place])    Right breast invasive CA and DCIS , 7 mm T1,N0,M0. Er/PR pos, her 2 negative.  Margins 1 mm.   CHF (congestive heart failure) (HCC)    CKD (chronic kidney disease), stage III (HCC)    Diabetes mellitus type 2 in obese ([place]) 02/09/2020   Diabetes mellitus without complication ([place])    Dyslipidemia 02/09/2020   Essential hypertension    Gout    Hypothyroidism    Menopause    age 33   Morbid obesity ([place])    Persistent atrial fibrillation ([place])    a. Dx 10/2018 in setting of PNA. CHA2DS2VASc = 4-->Eliquis; b. 11/2018  s/p successful DCCV.   Personal history of radiation therapy    Pulmonary embolism ([place]) 10/2014   a. Chronic eliquis.   Thyroid goiter    Past Surgical History  Past Surgical History:  Procedure Laterality Date   BREAST BIOPSY Right 2014   breast ca   BREAST EXCISIONAL BIOPSY Right 07/19/2012   breast ca   BREAST SURGERY Right 2014   with sentinel node bx subareaolar duct excision   CARDIOVERSION N/A 11/28/2018   Procedure: CARDIOVERSION;  Surgeon: Minna Merritts, MD;  Location: [facility] ORS;  Service: Cardiovascular;  Laterality: N/A;   COLONOSCOPY WITH PROPOFOL N/A 01/30/2017   Procedure: COLONOSCOPY WITH PROPOFOL;  Surgeon: Christene Lye, MD;  Location: [facility] ENDOSCOPY;  Service: Endoscopy;  Laterality: N/A;   CYSTOSCOPY W/ URETERAL STENT PLACEMENT Left 02/14/2021   Procedure: CYSTOSCOPY WITH RETROGRADE PYELOGRAM/URETERAL STENT PLACEMENT;  Surgeon: Billey Co, MD;  Location: [facility] ORS;  Service: Urology;  Laterality: Left;   DIALYSIS/PERMA CATHETER INSERTION N/A 02/24/2021   Procedure: DIALYSIS/PERMA CATHETER INSERTION;  Surgeon: Algernon Huxley, MD;  Location: [place] CV LAB;  Service: Cardiovascular;  Laterality: N/A;   Family History  Family History  Problem Relation Age of Onset   Diabetes Father    Hypertension Father  Parkinson's disease Father    Hypertension Mother    Rheum arthritis Mother    Atrial fibrillation Mother    Heart failure Mother    Heart attack Mother        died in her mid-70's.   Colon cancer Cousin 68   Breast cancer Neg Hx    Social History  reports that she has never smoked. She has never used smokeless tobacco. She reports that she does not drink alcohol and does not use drugs. Allergies No Known Allergies Home medications Prior to Admission medications   Medication Sig Start Date End Date Taking? Authorizing Provider  acetaminophen (TYLENOL) 500 MG tablet Take 1,000 mg by mouth every 6 (six) hours as needed for moderate pain  (for pain).   Yes [provider]  albuterol (VENTOLIN HFA) 108 (90 Base) MCG/ACT inhaler Inhale 2 puffs into the lungs every 6 (six) hours as needed for wheezing or shortness of breath.   Yes [provider]  amLODipine (NORVASC) 5 MG tablet Take 5 mg by mouth 2 (two) times daily. 09/18/18  Yes [provider]  atorvastatin (LIPITOR) 20 MG tablet Take 20 mg by mouth at bedtime.    Yes [provider]  calcitRIOL (ROCALTROL) 0.25 MCG capsule Take 0.25 mcg by mouth daily. 05/13/20  Yes [provider]  calcium carbonate (OSCAL) 1500 (600 Ca) MG TABS tablet Take 600 mg of elemental calcium by mouth daily.    Yes [provider]  Cholecalciferol (VITAMIN D3) 50000 units TABS Take 50,000 Units by mouth once a week.   Yes [provider]  cloNIDine (CATAPRES) 0.2 MG tablet Take 1 tablet (0.2 mg total) by mouth 2 (two) times daily. 10/21/18  Yes Mayo, Pete Pelt, MD  Dulaglutide 1.5 MG/0.5ML SOPN Inject 1.5 mg into the skin every 7 (seven) days.   Yes [provider]  ELIQUIS 5 MG TABS tablet TAKE 1 TABLET TWICE A DAY Patient taking differently: Take 5 mg by mouth 2 (two) times daily. 12/27/20  Yes Sindy Guadeloupe, MD  levothyroxine (SYNTHROID, LEVOTHROID) 112 MCG tablet Take 112 mcg daily before breakfast by mouth.   Yes [provider]  Melatonin 10 MG TABS Take 10 mg by mouth at bedtime.   Yes [provider]  metoprolol tartrate (LOPRESSOR) 100 MG tablet Take 1 tablet (100 mg total) by mouth 2 (two) times daily. 02/22/21 03/24/21 Yes Richarda Osmond, MD  fluticasone-salmeterol (ADVAIR HFA) [phone] MCG/ACT inhaler Inhale 2 puffs into the lungs 2 (two) times daily as needed.  Patient not taking: No sig reported  02/09/20  [provider]     Vitals:   03/06/21 2219 03/07/21 0517 03/07/21 0525 03/07/21 0740  BP:  131/88  (!) 155/59  Pulse: 79 68  81  Resp: 16 18  16   Temp:  97.8 F (36.6 C)  (!) 97.4 F  (36.3 C)  TempSrc:  Oral  Oral  SpO2: 98% 97%  97%  Weight:   (!) 164.3 kg   Height:        Exam:   alert, nad , obese  no jvd  Chest cta bilat  Cor reg no RG  Abd soft ntnd no ascites   Ext diffuse 2+ bilat LE edema   Alert, NF, ox3   RIJ TDC in place   Assessment/ Plan: AKI on CKD IV: baseline creatinine of 2.43 in Nov 2022. Dialysis initiated on 02/25/21 at [facility]. Arranged for outpatient HD at [facility] on TTS  schedule. Transferred [place] to CIR at [facility] for rehab.  HD on TTS schedule here. HD today.  Hypertension w/ chronic kidney disease: On amlodipine, clonidine and metoprolol. BP stable. Sig volume overload w/ diffuse LE edema. Needs high UFG's on HD.  Anemia of chronic kidney disease: Iron levels consistent with iron deficiency. SP prbc's.  Hematuria: Ureteral stent placed by urology on 02/14/2021.  With recent episode of nephrolithiasis.  No further reports of hematuria reports Secondary Hyperparathyroidism: PTH on 12/30/20 was 72. Currently without binders. Phosphorus 4.7. Continue calcitriol and ergocalciferol.  Acute respiratory failure: increased O2 needed initially from her baseline of 2 liters. Weaned to 2L now. Still vol overload.      Rob Crucita Lacorte 03/05/2021, 1:54 PM    Recent Labs  Lab 03/04/21 0746 03/05/21 1017  WBC 12.8* 12.1*  HGB 6.9* 7.5*   Recent Labs  Lab 03/01/21 0719 03/03/21 1023 03/04/21 0746 03/05/21 1017  K 4.4 3.3* 3.9 3.9  BUN 62* 44* 58* 70*  CREATININE 4.39* 3.98* 5.00* 6.04*  CALCIUM 8.3* 7.6* 7.8* 8.4*  PHOS 4.4 4.7*  --   --

## 2021-03-07 NOTE — Progress Notes (Signed)
Contacted dialysis regarding pt's extra day of dialysis planned for today. Pt is to go to HD tomorrow for her regular schedule, per HD RN.   Gerald Stabs, RN

## 2021-03-07 NOTE — IPOC Note (Signed)
Overall Plan of Care [facility]) Patient Details Name: Nicole Schmidt MRN: [id number] DOB: 11/29/1965  Admitting Diagnosis: Debility  Hospital Problems: Principal Problem:   Debility     Functional Problem List: Nursing Bowel, Edema, Safety, Pain, Endurance, Medication Management  PT Balance, Edema, Endurance, Nutrition, Pain, Skin Integrity  OT Balance, Endurance, Motor, Pain  SLP    TR         Basic ADLs: OT Grooming, Bathing, Dressing, Toileting     Advanced  ADLs: OT Simple Meal Preparation, Light Housekeeping     Transfers: PT Bed Mobility, Bed to Chair, Car, Manufacturing systems engineer, Metallurgist: PT Ambulation, Emergency planning/management officer, Stairs     Additional Impairments: OT None  SLP        TR      Anticipated Outcomes Item Anticipated Outcome  Self Feeding ind  Swallowing      Basic self-care  mod I  Toileting  mod I   Bathroom Transfers mod I  Bowel/Bladder  manage bowel with mod I  Transfers  Mod I with LRAD  Locomotion  Mod I with LRAD  Communication     Cognition     Pain  pain at or below level 4 with prn meds  Safety/Judgment  maintain safety with cues   Therapy Plan: PT Intensity: Minimum of 1-2 x/day ,45 to 90 minutes PT Frequency: 5 out of 7 days PT Duration Estimated Length of Stay: 7-10 days OT Intensity: Minimum of 1-2 x/day, 45 to 90 minutes OT Frequency: 5 out of 7 days OT Duration/Estimated Length of Stay: 5-7 days     Due to the current state of emergency, patients may not be receiving their 3-hours of Medicare-mandated therapy.   Team Interventions: Nursing Interventions Disease Management/Prevention, Medication Management, Discharge Planning, Pain Management, Bowel Management, Patient/Family Education  PT interventions Ambulation/gait training, Discharge planning, Functional mobility training, Psychosocial support, Therapeutic Activities, Balance/vestibular training, Disease management/prevention, Neuromuscular  re-education, Skin care/wound management, Therapeutic Exercise, Wheelchair propulsion/positioning, DME/adaptive equipment instruction, Pain management, Splinting/orthotics, UE/LE Strength taining/ROM, Community reintegration, Technical sales engineer stimulation, Patient/family education, IT trainer, UE/LE Coordination activities  OT Interventions    SLP Interventions    TR Interventions    SW/CM Interventions Discharge Planning, Psychosocial Support, Disease Management/Prevention, Patient/Family Education   Barriers to Discharge MD  Medical stability  Nursing Lack of/limited family support, Hemodialysis 1 level 2/4 ste bil rails solo; moving ASAP 1 level 4 ste bil rail home  PT Inaccessible home environment, Decreased caregiver support, Home environment access/layout, Weight, Hemodialysis 2 STE at front with 0 handrails, lives alone, now on hemodialysis, pain, decreased endurance  OT Decreased caregiver support, Inaccessible home environment, Home environment access/layout, Weight    SLP      SW Other (comments), Insurance for SNF coverage, Lack of/limited family support, Decreased caregiver support, Hemodialysis, New diabetic o2   Team Discharge Planning: Destination: PT-Home ,OT- Home , SLP-  Projected Follow-up: PT-Home health PT, OT-  None, SLP-  Projected Equipment Needs: PT-To be determined, OT- To be determined, SLP-  Equipment Details: PT- , OT-  Patient/family involved in discharge planning: PT- Patient,  OT-Patient, SLP-   MD ELOS: 10-14 days Medical Rehab Prognosis:  Excellent Assessment: Nicole Schmidt is a 56 year old woman admitted to CIR with functional deficits/debility secondary to acute on chronic renal failure superimposed with diastolic congestive heart failure. Medications are being managed, and labs and vitals are being monitored regularly.     See Team Conference Notes for  weekly updates to the plan of care

## 2021-03-07 NOTE — Progress Notes (Signed)
Occupational Therapy Session Note  Patient Details  Name: Nicole Schmidt MRN: [id number] Date of Birth: 1965/07/31  Today's Date: 03/07/2021 OT Individual Time: 1025-8527 OT Individual Time Calculation (min): 70 min    Short Term Goals: Week 1:  OT Short Term Goal 1 (Week 1): STG = LTG 2/2 ELOS  Skilled Therapeutic Interventions/Progress Updates:  Session 1 Skilled OT intervention completed with focus on dynamic standing tolerance, cardiovascular endurance, BUE strengthening, functional transfers. Pt received seated in recliner, agreeable to session. Therapist discussing with pt, her PLOF and OT purpose as well as rehab goals. Pt declining self-care tasks, with PA in room assessing pt. Pt with [place] on, 2L O2, with 96% sat, therapist monitored throughout session and pt remained on 2L via [place] wtih 96% sat. Pt sit > stand without AD with supervision, then stand pivot to w/c without AD with CGA. Transported pt with total A in w/c for energy conservation <> therapy gym, then pt CGA stand pivot to EOM without AD. Pt informing therapist of prior history with sensory ataxia, with pt explaining she relies on sensory input with ambulation or during functional tasks at baseline. Pt able to tolerate 5+ mins in standing during pipe tower task at table top without use of AD with supervision required for balance to promote dynamic balance needed for self-care tasks. While seated EOM pt participated in BUE strengthening using 4 pound dowel to promote overall endurance needed for self-care tasks including the following:  Bicep flexion 2x15 Mini overhead press 2x15 Cross over rows 2x20 Chest presses 2x15 Tricep row 2x15 each arm  Pt required cues for form and technique throughout. Pt required rest breaks due to decreased endurance, however maintained good O2 sats with pursed lip breathing during rest periods when demonstrating SOB. Pt left seated in recliner, with chair alarm on, and all needs in reach at end of  session.   Therapy Documentation Precautions:  Precautions Precautions: Fall Precaution Comments: 3L O2 Restrictions Weight Bearing Restrictions: No RLE Weight Bearing: Weight bearing as tolerated LLE Weight Bearing: Weight bearing as tolerated  Pain: No c/o pain   Therapy/Group: Individual Therapy  Nicole Schmidt 03/07/2021, 7:46 AM

## 2021-03-07 NOTE — Progress Notes (Signed)
PROGRESS NOTE   Subjective/Complaints: Tolerating therapy well today O2 sat down to 84 with activity Ambulating >150 feet She has plans to go to concert next month  ROS: foot pain improved, sleep improved  Objective:   No results found. Recent Labs    03/05/21 1017  WBC 12.1*  HGB 7.5*  HCT 25.5*  PLT 250   Recent Labs    03/05/21 1017  NA 136  K 3.9  CL 98  CO2 24  GLUCOSE 141*  BUN 70*  CREATININE 6.04*  CALCIUM 8.4*    Intake/Output Summary (Last 24 hours) at 03/07/2021 1231 Last data filed at 03/07/2021 0945 Gross per 24 hour  Intake 420 ml  Output 401 ml  Net 19 ml        Physical Exam: Vital Signs Blood pressure (!) 155/59, pulse 81, temperature (!) 97.4 F (36.3 C), temperature source Oral, resp. rate 16, height 5\' 4"  (1.626 m), weight (!) 164.3 kg, last menstrual period 07/17/2012, SpO2 97 %. Gen: no distress, normal appearing HEENT: oral mucosa pink and moist, nasal cannula in place Cardio: Irregular Chest: normal effort, normal rate of breathing, diminished breath sounds bilaterally, O2 sat down to 84 with activity Abd: soft, non-distended Ext: 1+ bilateral lower extremity edema Psych: pleasant, normal affect Skin: RIJ permacath C/D/I, dry and scaly skin on bilateral feet. Neurological:     Comments: Patient is alert.  No acute distress.  Mood is a bit flat but appropriate.  Follows commands.  Oriented x3. Allodynia over lateral aspect of right foot- improved. Decreased sensation bilateral feet   Assessment/Plan: 1. Functional deficits which require 3+ hours per day of interdisciplinary therapy in a comprehensive inpatient rehab setting. Physiatrist is providing close team supervision and 24 hour management of active medical problems listed below. Physiatrist and rehab team continue to assess barriers to discharge/monitor patient progress toward functional and medical goals  Care  Tool:  Bathing    Body parts bathed by patient: Face (UB and LB Bathing per OT Note completed with Supervision)         Bathing assist Assist Level: Supervision/Verbal cueing     Upper Body Dressing/Undressing Upper body dressing   What is the patient wearing?: Hospital gown only    Upper body assist Assist Level: Independent    Lower Body Dressing/Undressing Lower body dressing      What is the patient wearing?: Hospital gown only     Lower body assist Assist for lower body dressing: Independent     Toileting Toileting Toileting Activity did not occur (Clothing management and hygiene only): N/A (no void or bm)  Toileting assist Assist for toileting: Contact Guard/Touching assist     Transfers Chair/bed transfer  Transfers assist  Chair/bed transfer activity did not occur: Refused (pt sleeps in chair)  Chair/bed transfer assist level: Contact Guard/Touching assist     Locomotion Ambulation   Ambulation assist      Assist level: Contact Guard/Touching assist Assistive device: Cane-straight Max distance: 131ft   Walk 10 feet activity   Assist     Assist level: Contact Guard/Touching assist Assistive device: Cane-straight   Walk 50 feet activity   Assist Walk 50  feet with 2 turns activity did not occur: Safety/medical concerns (fatigue, R foot pain, weakness)  Assist level: Contact Guard/Touching assist Assistive device: Cane-straight    Walk 150 feet activity   Assist Walk 150 feet activity did not occur: Safety/medical concerns (fatigue, R foot pain, weakness)         Walk 10 feet on uneven surface  activity   Assist Walk 10 feet on uneven surfaces activity did not occur: Safety/medical concerns (fatigue, R foot pain, weakness)         Wheelchair     Assist Is the patient using a wheelchair?: Yes Type of Wheelchair: Manual    Wheelchair assist level: Supervision/Verbal cueing Max wheelchair distance: 56ft     Wheelchair 50 feet with 2 turns activity    Assist        Assist Level: Moderate Assistance - Patient 50 - 74% (per PT documentation)   Wheelchair 150 feet activity     Assist      Assist Level: Total Assistance - Patient < 25% (Per PT documentation)   Blood pressure (!) 155/59, pulse 81, temperature (!) 97.4 F (36.3 C), temperature source Oral, resp. rate 16, height 5\' 4"  (1.626 m), weight (!) 164.3 kg, last menstrual period 07/17/2012, SpO2 97 %.    Medical Problem List and Plan: 1. Functional deficits/debility secondary to acute on chronic renal failure superimposed with diastolic congestive heart failure             -patient may shower             -ELOS/Goals: 10-14 days modI             -Continue CIR 2.  Antithrombotics: -DVT/anticoagulation:  Pharmaceutical: Other (comment) Eliquis             -antiplatelet therapy: N/A 3. Right foot pain: Tylenol as needed. D/c Dilaudid, Add gabapentin HS 4. Insomnia: continue Gabapentin HS. Melatonin as needed             -antipsychotic agents: N/A 5. Neuropsych: This patient is capable of making decisions on her own behalf. 6. Skin/Wound Care: Routine skin checks 7. Fluids/Electrolytes/Nutrition: Routine in and outs with follow-up chemistries 8.  End-stage renal disease.  Permacath placed 02/25/2021 hemodialysis initiated. Discussed recent creatinine level with her 9.  History of hematuria.  Left ureteral stone with recent uteroscopy/laser lithotripsy.  Follow-up urology Dr Diamantina [place] 10.  Hypothyroidism.  Synthroid 11.  Diabetes mellitus.  Hemoglobin A1c 7.2.  SSI. 12.  Hypothyroidism.  Continue Synthroid 13.  Hypertension.  Provided with list of foods that can help to lower blood pressure. Continue Clonidine 0.2 mg twice daily, Norvasc 5 mg twice daily.  Monitor with increased mobility 14.  Hyperlipidemia.  Lipitor 15.  Atrial fibrillation/history of pulmonary emboli. Continue Eliquis.  Lopressor 100 mg twice daily 16.   Acute on chronic anemia.  Transfuse 1 unit RBCs 03/04/2021.  Follow-up CBC 17.  Obesity.  BMI 62.17.  Dietary follow-up 18.  Chronic hypoxemic respiratory failure.  Continue oxygen therapy as directed as well as CPAP 19. Constipation: type 2 stool 1/21.  20. Dry skin: order Eucerin cream.    LOS: 3 days A FACE TO FACE EVALUATION WAS PERFORMED  Clide Deutscher Zina Pitzer 03/07/2021, 12:31 PM

## 2021-03-07 NOTE — Progress Notes (Signed)
Occupational Therapy Session Note  Patient Details  Name: Nicole Schmidt MRN: [id number] Date of Birth: Jan 23, 1966  Today's Date: 03/07/2021 OT Individual Time: 1345-1444 OT Individual Time Calculation (min): 59 min    Short Term Goals: Week 1:  OT Short Term Goal 1 (Week 1): STG = LTG 2/2 ELOS  Skilled Therapeutic Interventions/Progress Updates:  Pt greeted seated in recliner agreeable to OT intervention. Session focus on therapeutic activities focused on dynamic standing balance, standing tolerance for ADLs and increasing overall activity tolerance.  Pt able to ambulate 58 ft with SPC with CGA with w/c follow. Pt on 2L [place] during session with SpO2 > 87% but rebounds quickly in less than 1 min with seated rest break. General education provided on energy conservation and completing "self checks" as needed to adapt home routine based on how much energy pt has left. Pt verbalized understanding of education but would benefit from further education and handout. Remainder of session pt completed various therapeutic activities designed to challenge pts dynamic standing balance and increase overall activity tolerance for increased ADL independence. Pt able to stand for a total of 3 mins at a time to complete cog task of reaching out of BOS to tap number sequence correlating with numbers stated by this OTA, increased challenged and had pt tap odd numbers with her LUE and even numbers with her RUE. Pt completed task with overall CGA with no UE support. Pt also completed dynamic reach task where pt instructed to reach out of BOS to retrieve clothespin from line with no UE support and overall CGA. Pt transported back to room with total A for energy conservation with pt completed stand pivot back to recliner with SPC and CGA. Pt left seated in recliner with all needs within reach.           Therapy Documentation Precautions:  Precautions Precautions: Fall Precaution Comments: 3L O2 Restrictions Weight  Bearing Restrictions: No RLE Weight Bearing: Weight bearing as tolerated LLE Weight Bearing: Weight bearing as tolerated  Pain: no pain reported during session     Therapy/Group: Individual Therapy  Precious Haws 03/07/2021, 3:31 PM

## 2021-03-07 NOTE — Progress Notes (Signed)
Inpatient Rehabilitation  Patient information reviewed and entered into eRehab system by Noah Lembke Versia Mignogna, OTR/L.   Information including medical coding, functional ability and quality indicators will be reviewed and updated through discharge.    

## 2021-03-07 NOTE — Progress Notes (Signed)
[facility] Individual Statement of Services  Patient Name:  Nicole Schmidt  Date:  03/07/2021  Welcome to the [facility].  Our goal is to provide you with an individualized program based on your diagnosis and situation, designed to meet your specific needs.  With this comprehensive rehabilitation program, you will be expected to participate in at least 3 hours of rehabilitation therapies Monday-Friday, with modified therapy programming on the weekends.  Your rehabilitation program will include the following services:  Physical Therapy (PT), Occupational Therapy (OT), Speech Therapy (ST), 24 hour per day rehabilitation nursing, Therapeutic Recreaction (TR), Neuropsychology, Care Coordinator, Rehabilitation Medicine, Nutrition Services, Pharmacy Services, and Other  Weekly team conferences will be held on Wednesdays to discuss your progress.  Your Inpatient Rehabilitation Care Coordinator will talk with you frequently to get your input and to update you on team discussions.  Team conferences with you and your family in attendance may also be held.  Expected length of stay: 10-14 Days  Overall anticipated outcome:  MOD I/Supervision  Depending on your progress and recovery, your program may change. Your Inpatient Rehabilitation Care Coordinator will coordinate services and will keep you informed of any changes. Your Inpatient Rehabilitation Care Coordinator's name and contact numbers are listed  below.  The following services may also be recommended but are not provided by the [facility]:   [place] will be made to provide these services after discharge if needed.  Arrangements include referral to agencies that provide these services.  Your insurance has been verified to be:  [employer] Your primary doctor is:  NO PCP  Pertinent information will be shared  with your doctor and your insurance company.  Inpatient Rehabilitation Care Coordinator:  Erlene Quan, [place] or [phone]  Information discussed with and copy given to patient by: Dyanne Iha, 03/07/2021, 10:34 AM

## 2021-03-07 NOTE — Progress Notes (Signed)
Physical Therapy Session Note  Patient Details  Name: Nicole Schmidt MRN: [id number] Date of Birth: 1965-08-12  Today's Date: 03/07/2021 PT Individual Time: 0800-0908 PT Individual Time Calculation (min): 68 min   Short Term Goals: Week 1:  PT Short Term Goal 1 (Week 1): STG=LTG due to LOS  Skilled Therapeutic Interventions/Progress Updates:   Received pt sitting in recliner, pt agreeable to PT treatment, and reported discomfort in buttocks from sitting but reported pain in R foot has improved since Saturday. Pt reports sleeping in recliner at home and has been doing the same since being in the hospital, reporting it is easier for her to breathe. Session with emphasis on functional mobility, toileting, dressing, generalized strengthening and endurance, dynamic standing balance/coordination, stair navigation, and gait training. Sit<>stand without AD and CGA and pt ambulated in/out of bathroom without AD and CGA. Pt able to perform all toileting tasks with supervision and continent of bladder. Stood at sink and washed hands/brushed teeth with supervision. Pt with mild SOB after standing for prolonged amount of time but pulse oximeter not showing accurate reading - therefore went based off pt's symptoms. Donned underwear and pants with set up assist and pt stood with close supervision to pull pants over hips. Stand<>pivot recliner<>WC without AD and CGA and transported to/from room in [facility] dependently for time management purposes. Pt navigated 4 steps with 2 rails and CGA ascending and descending with a step to pattern. O2 sat 99% on 2L O2 afterwards but pt required extended rest break to recover. Provided pt with SPC and pt ambulated 37ft x 1 and 197ft x 1 with SPC and WC with total A for equipment management and with 2 extended standing rest breaks. O2 sat dropped to 84% but increased to >91% with rest. Pt demonstrates improvements in stability and endurance with SPC. Pt requested to return to recliner and  ambulated 34ft with SPC and CGA back to recliner. Concluded session with pt sitting in recliner with all needs within reach.  Therapy Documentation Precautions:  Precautions Precautions: Fall Precaution Comments: 3L O2 Restrictions Weight Bearing Restrictions: No RLE Weight Bearing: Weight bearing as tolerated LLE Weight Bearing: Weight bearing as tolerated  Therapy/Group: Individual Therapy Alfonse Alpers PT, DPT   03/07/2021, 7:11 AM

## 2021-03-08 LAB — CBC
HCT: 24.9 % — ABNORMAL LOW (ref 36.0–46.0)
Hemoglobin: 7.5 g/dL — ABNORMAL LOW (ref 12.0–15.0)
MCH: 27.8 pg (ref 26.0–34.0)
MCHC: 30.1 g/dL (ref 30.0–36.0)
MCV: 92.2 fL (ref 80.0–100.0)
Platelets: 214 10*3/uL (ref 150–400)
RBC: 2.7 MIL/uL — ABNORMAL LOW (ref 3.87–5.11)
RDW: 15.5 % (ref 11.5–15.5)
WBC: 11 10*3/uL — ABNORMAL HIGH (ref 4.0–10.5)
nRBC: 0.4 % — ABNORMAL HIGH (ref 0.0–0.2)

## 2021-03-08 LAB — RENAL FUNCTION PANEL
Albumin: 2.4 g/dL — ABNORMAL LOW (ref 3.5–5.0)
Anion gap: 11 (ref 5–15)
BUN: 71 mg/dL — ABNORMAL HIGH (ref 6–20)
CO2: 23 mmol/L (ref 22–32)
Calcium: 8.2 mg/dL — ABNORMAL LOW (ref 8.9–10.3)
Chloride: 96 mmol/L — ABNORMAL LOW (ref 98–111)
Creatinine, Ser: 6.61 mg/dL — ABNORMAL HIGH (ref 0.44–1.00)
GFR, Estimated: 7 mL/min — ABNORMAL LOW (ref >60)
Glucose, Bld: 111 mg/dL — ABNORMAL HIGH (ref 70–99)
Phosphorus: 8.4 mg/dL — ABNORMAL HIGH (ref 2.5–4.6)
Potassium: 4.1 mmol/L (ref 3.5–5.1)
Sodium: 130 mmol/L — ABNORMAL LOW (ref 135–145)

## 2021-03-08 LAB — HEPATITIS B SURFACE ANTIBODY,QUALITATIVE: Hep B S Ab: NONREACTIVE

## 2021-03-08 LAB — GLUCOSE, CAPILLARY
Glucose-Capillary: 106 mg/dL — ABNORMAL HIGH (ref 70–99)
Glucose-Capillary: 115 mg/dL — ABNORMAL HIGH (ref 70–99)
Glucose-Capillary: 158 mg/dL — ABNORMAL HIGH (ref 70–99)
Glucose-Capillary: 181 mg/dL — ABNORMAL HIGH (ref 70–99)

## 2021-03-08 LAB — HEPATITIS B SURFACE ANTIGEN: Hepatitis B Surface Ag: NONREACTIVE

## 2021-03-08 MED ORDER — LIDOCAINE HCL (PF) 1 % IJ SOLN: 5.0000 mL | INTRAMUSCULAR | Status: DC | PRN

## 2021-03-08 MED ORDER — LIDOCAINE-PRILOCAINE 2.5-2.5 % EX CREA: 1.0000 "application " | TOPICAL_CREAM | CUTANEOUS | Status: DC | PRN

## 2021-03-08 MED ORDER — SODIUM CHLORIDE 0.9 % IV SOLN: 100.0000 mL | INTRAVENOUS | Status: DC | PRN

## 2021-03-08 MED ORDER — HEPARIN SODIUM (PORCINE) 1000 UNIT/ML DIALYSIS: 2000.0000 [IU] | INTRAMUSCULAR | Status: DC | PRN

## 2021-03-08 MED ORDER — ALTEPLASE 2 MG IJ SOLR: 2.0000 mg | Freq: Once | INTRAMUSCULAR | Status: DC | PRN

## 2021-03-08 MED ORDER — PENTAFLUOROPROP-TETRAFLUOROETH EX AERO: 1.0000 "application " | INHALATION_SPRAY | CUTANEOUS | Status: DC | PRN

## 2021-03-08 MED ORDER — HEPARIN SODIUM (PORCINE) 1000 UNIT/ML IJ SOLN
INTRAMUSCULAR | Status: AC
  Filled 2021-03-08: qty 4

## 2021-03-08 MED ORDER — HEPARIN SODIUM (PORCINE) 1000 UNIT/ML DIALYSIS: 20.0000 [IU]/kg | INTRAMUSCULAR | Status: DC | PRN

## 2021-03-08 MED ORDER — HEPARIN SODIUM (PORCINE) 1000 UNIT/ML DIALYSIS: 1000.0000 [IU] | INTRAMUSCULAR | Status: DC | PRN

## 2021-03-08 NOTE — Progress Notes (Signed)
[facility] Progress Note  Subjective: Seen in room. Didn't get extra HD yesterday. Dialysis today, on schedule.   Vitals:   03/07/21 1356 03/07/21 1947 03/08/21 0608 03/08/21 0611  BP: (!) 144/57 (!) 145/71 130/68   Pulse: 87 77 63   Resp: 15  18   Temp: 98 F (36.7 C) 97.6 F (36.4 C) 98.2 F (36.8 C)   TempSrc: Oral     SpO2: 100% 98% 91%   Weight:    (!) 165.6 kg  Height:        Exam:   alert, nad , obese  no jvd  Chest cta bilat  Cor reg no RG  Abd soft ntnd no ascites   Ext diffuse 2+ bilat LE edema   Alert, NF, ox3   RIJ TDC in place  Accepted to [facility]    Assessment/ Plan: Acute Kidney Injury on chronic kidney disease stage IV: baseline creatinine of 2.43 in Nov 2022. Dialysis initiated on 02/25/21. Arranged for outpatient HD at [facility] on TTS schedule. Transferred [place] to CIR at [facility] for rehab.  HD on TTS schedule here. HD today.  Hypertension w/ chronic kidney disease: On amlodipine, clonidine and metoprolol here. BP stable. Sig volume overload w/ diffuse LE edema. High UF goals w/ HD.  Anemia of chronic kidney disease: Hgb 7.5. s/p prbcs. Tsat 11 Ferritin 273 -will replete iron here.    Aranesp 25 q week ordered today. Hematuria: Ureteral stent placed by urology on 02/14/2021.  With recent episode of nephrolithiasis.  No further reports of hematuria reports Secondary Hyperparathyroidism: PTH on 12/30/20 was 72. Currently without binders. Phosphorus 4.7. Continue calcitriol and ergocalciferol.  Acute respiratory failure: increased O2 needed initially from her baseline of 2 liters. Weaned to 2L now. Still vol overload.    Lynnda Child PA-C [facility] 03/08/2021,1:07 PM  Recent Labs  Lab 03/03/21 1023 03/04/21 0746 03/05/21 1017 03/08/21 0545  K 3.3*   < > 3.9 4.1  BUN 44*   < > 70* 71*  CREATININE 3.98*   < > 6.04* 6.61*  CALCIUM 7.6*   < > 8.4* 8.2*  PHOS 4.7*  --   --  8.4*  HGB 7.4*   < >  7.5* 7.5*   < > = values in this interval not displayed.    Inpatient medications:  amLODipine  5 mg Oral BID   apixaban  5 mg Oral BID   atorvastatin  20 mg Oral QHS   calcitRIOL  0.25 mcg Oral Daily   calcium carbonate  500 mg of elemental calcium Oral Q breakfast   Chlorhexidine Gluconate Cloth  6 each Topical Daily   cloNIDine  0.2 mg Oral BID   Darbepoetin Alfa  25 mcg Intravenous Once per day on Mon Wed Fri   folic acid  7,619 mcg Oral Daily   gabapentin  300 mg Oral QHS   hydrocerin   Topical BID   insulin aspart  0-9 Units Subcutaneous TID AC & HS   levothyroxine  112 mcg Oral QAC breakfast   metoprolol tartrate  100 mg Oral BID   [START ON 03/11/2021] Vitamin D (Ergocalciferol)  50,000 Units Oral Q Fri    [START ON 03/09/2021] sodium chloride     [START ON 03/09/2021] sodium chloride     ferric gluconate (FERRLECIT) IVPB Stopped (03/08/21 1211)   [START ON 03/09/2021] sodium chloride, [START ON 03/09/2021] sodium chloride, acetaminophen **OR** acetaminophen, albuterol, [START ON 03/09/2021] alteplase, bisacodyl, [START  ON 03/09/2021] heparin, [START ON 03/09/2021] heparin, [START ON 03/09/2021] heparin, [START ON 03/09/2021] lidocaine (PF), [START ON 03/09/2021] lidocaine-prilocaine, melatonin, ondansetron **OR** ondansetron (ZOFRAN) IV, [START ON 03/09/2021] pentafluoroprop-tetrafluoroeth, traZODone

## 2021-03-08 NOTE — Progress Notes (Signed)
Advised of pt's admission to CIR from renal navigator at [facility]. Pt has been accepted at [facility] on TTS 10:45 schedule. Will follow and assist with start of care at clinic at d/c from [place]. Contacted CIR CSW to make her aware will assist with new start of care at out-pt HD clinic when pt stable for d/c.   Melven Sartorius Renal Navigator [phone]

## 2021-03-08 NOTE — Progress Notes (Signed)
Occupational Therapy Session Note  Patient Details  Name: Nicole Schmidt MRN: [id number] Date of Birth: 09-18-1965  Today's Date: 03/08/2021 OT Individual Time: 0800-0900 OT Individual Time Calculation (min): 60 min    Short Term Goals: Week 1:  OT Short Term Goal 1 (Week 1): STG = LTG 2/2 ELOS  Skilled Therapeutic Interventions/Progress Updates:  Pt greeted asleep in recliner but easily able to arouse and agreeable to OT intervention. Session focus on BADL reeducation, functional mobility, increasing overall activity tolerance and decreasing overall caregiver burden. Pt completed functional mobility from recliner>w/c positioned at sink with SPC and CGA. OTA assisted pt with washing her hair/ blow drying hair from sitting in w/c. Pt completed standing grooming tasks at sink with no UE support and close supervision. Pt completed functional mobility back to recliner with SPC and CGA. Pt completed LB dressing with overall MIN A needed to don pants as pt with difficulty threading pants over gripper socks and presents with increased fatigue when bending over to don.  Brief education on using reacher as needed for energy conservation. Pt left seated in recliner with all needs within reach.                                    Therapy Documentation Precautions:  Precautions Precautions: Fall Precaution Comments: 3L O2 Restrictions Weight Bearing Restrictions: No RLE Weight Bearing: Weight bearing as tolerated LLE Weight Bearing: Weight bearing as tolerated   Pain: no pain reported during session     Therapy/Group: Individual Therapy  Corinne Ports [facility] 03/08/2021, 11:56 AM

## 2021-03-08 NOTE — Progress Notes (Signed)
Inpatient Rehabilitation Care Coordinator Assessment and Plan Patient Details  Name: Nicole Schmidt MRN: [id number] Date of Birth: 04-27-1965  Today's Date: 03/08/2021  Hospital Problems: Principal Problem:   Debility  Past Medical History:  Past Medical History:  Diagnosis Date   (HFpEF) heart failure with preserved ejection fraction ([place])    a. 10/2018 Echo: EF 60-65%, diast dysfxn, RVSP 50.7 mmHg, mildly dil LA.   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 Er/PR pos, her 2 negative.  Margins 1 mm.   CHF (congestive heart failure) (HCC)    CKD (chronic kidney disease), stage III (HCC)    Diabetes mellitus type 2 in obese ([place]) 02/09/2020   Diabetes mellitus without complication ([place])    Dyslipidemia 02/09/2020   Essential hypertension    Gout    Hypothyroidism    Menopause    age 56   Morbid obesity ([place])    Persistent atrial fibrillation ([place])    a. Dx 10/2018 in setting of PNA. CHA2DS2VASc = 4-->Eliquis; b. 11/2018 s/p successful DCCV.   Personal history of radiation therapy    Pulmonary embolism ([place]) 10/2014   a. Chronic eliquis.   Thyroid goiter    Past Surgical History:  Past Surgical History:  Procedure Laterality Date   BREAST BIOPSY Right 2014   breast ca   BREAST EXCISIONAL BIOPSY Right 07/19/2012   breast ca   BREAST SURGERY Right 2014   with sentinel node bx subareaolar duct excision   CARDIOVERSION N/A 11/28/2018   Procedure: CARDIOVERSION;  Surgeon: Minna Merritts, MD;  Location: [facility] ORS;  Service: Cardiovascular;  Laterality: N/A;   COLONOSCOPY WITH PROPOFOL N/A 01/30/2017   Procedure: COLONOSCOPY WITH PROPOFOL;  Surgeon: Christene Lye, MD;  Location: [facility] ENDOSCOPY;  Service: Endoscopy;  Laterality: N/A;   CYSTOSCOPY W/ URETERAL STENT PLACEMENT Left  02/14/2021   Procedure: CYSTOSCOPY WITH RETROGRADE PYELOGRAM/URETERAL STENT PLACEMENT;  Surgeon: Billey Co, MD;  Location: [facility] ORS;  Service: Urology;  Laterality: Left;   DIALYSIS/PERMA CATHETER INSERTION N/A 02/24/2021   Procedure: DIALYSIS/PERMA CATHETER INSERTION;  Surgeon: Algernon Huxley, MD;  Location: [facility];  Service: Cardiovascular;  Laterality: N/A;   Social History:  reports that she has never smoked. She has never used smokeless tobacco. She reports that she does not drink alcohol and does not use drugs.  Family / Support Systems Marital Status: Single Other Supports: Manuela Schwartz and Marlowe Kays Anticipated Caregiver: Susie Ability/Limitations of Caregiver: PRN A Caregiver Availability: Intermittent Family Dynamics: support from cousins  Social History Preferred language: English Religion: Curator - How often do you need to have someone help you when you read instructions, pamphlets, or other written material from your doctor or pharmacy?: Never Writes: Yes Employment Status: Employed Public relations account executive Issues: n/a Guardian/Conservator: n/a   Abuse/Neglect Abuse/Neglect Assessment Can Be Completed: Yes Physical Abuse: Denies Verbal Abuse: Denies Sexual Abuse: Denies Exploitation of patient/patient's resources: Denies Self-Neglect: Denies  Patient response to: Social Isolation - How often do you feel lonely or isolated from those around you?: Never  Emotional Status Recent Psychosocial Issues: coping Psychiatric History: n/a Substance Abuse History: n/a  Patient / Family Perceptions, Expectations & Goals Pt/Family understanding of illness & functional limitations: yes Premorbid pt/family roles/activities: previosuly independ, driving and  working Anticipated changes in roles/activities/participation: assistance with tasks Pt/family expectations/goals: Anticipating MOD I, pt and family informed 24/7 supervision potential  Programme researcher, broadcasting/film/video Agencies: None Premorbid Home Care/DME Agencies: Other (Comment) (Single Point Cane,Rollator, Rolling Walker, BSC, WC, shower seat) Is the patient able to respond to transportation needs?: Yes In the past 12 months, has lack of transportation kept you from medical appointments or from getting medications?: No In the past 12 months, has lack of transportation kept you from meetings, work, or from getting things needed for daily living?: No Resource referrals recommended: Neuropsychology  Discharge Planning Living Arrangements: Alone Support Systems: Other relatives Type of Residence: Private residence Insurance Resources: Multimedia programmer (specify) Nurse, mental health) Financial Resources: Employment, Family Support Financial Screen Referred: No Living Expenses: Own Money Management: Patient Does the patient have any problems obtaining your medications?: No Home Management: independent Patient/Family Preliminary Plans: family able to assist if needed Care Coordinator Barriers to Discharge: Other (comments), Insurance for SNF coverage, Lack of/limited family support, Decreased caregiver support, Hemodialysis, New diabetic Care Coordinator Barriers to Discharge Comments: o2 Care Coordinator Anticipated Follow Up Needs: [place] Additional Notes/Comments: HD: MWF Expected length of stay: 10-14 Days  Clinical Impression   Dyanne Iha 03/08/2021, 11:54 AM

## 2021-03-08 NOTE — Progress Notes (Signed)
Occupational Therapy Session Note  Patient Details  Name: Nicole Schmidt MRN: [id number] Date of Birth: 09/17/1965  Today's Date: 03/08/2021 OT Individual Time: 1105-1200 OT Individual Time Calculation (min): 55 min    Short Term Goals: Week 1:  OT Short Term Goal 1 (Week 1): STG = LTG 2/2 ELOS  Skilled Therapeutic Interventions/Progress Updates:  Skilled OT intervention completed with focus on AE and energy conservation education, as well as BUE strengthening. Pt received seated in recliner, reporting increased fatigue from earlier therapies. Pt agreeable to complete in room activities. Education provided on reacher, sock aid and long handled sponge for bathing/dressing, with pt able to return demonstrate use of reacher to donn BLE into pants with cues only and use of sock aid to donn both socks with min A. Education provided on energy conservation strategies for showering, kitchen management and other self-care tasks, with handout to read on own time issued to pt for further tips on conserving energy in the home. To promote BUE strengthening for overall endurance needed for functional tasks, pt completed the following with orange theraband:  Bicep flexion x10 each arm Shoulder flexion x10 each arm Horizontal shoulder abduction x10 Alternating chest presses 2x20  Pt required cues for form and technique throughout. Discussed with pt OT's f/u recommendations with pt in agreement that from self-care standpoint at d/c pt won't need OT f/u, but therapist planning to provided HEP for BUE strengthening to promote carryover of endurance once home. Pt left seated in recliner, with chair alarm on and all needs in reach at end of session.   Therapy Documentation Precautions:  Precautions Precautions: Fall Precaution Comments: 3L O2 Restrictions Weight Bearing Restrictions: No RLE Weight Bearing: Weight bearing as tolerated LLE Weight Bearing: Weight bearing as tolerated  Pain: No c/o  pain   Therapy/Group: Individual Therapy  Araminta Zorn E Girtie Wiersma 03/08/2021, 7:48 AM

## 2021-03-08 NOTE — Progress Notes (Signed)
Physical Therapy Session Note  Patient Details  Name: Nicole Schmidt MRN: [id number] Date of Birth: 25-Jul-1965  Today's Date: 03/08/2021 PT Individual Time: 0900-0958 PT Individual Time Calculation (min): 58 min   Short Term Goals: Week 1:  PT Short Term Goal 1 (Week 1): STG=LTG due to LOS  Skilled Therapeutic Interventions/Progress Updates:   Received pt sitting in recliner, pt agreeable to PT treatment, and denied any pain during session. Session with emphasis on functional mobility, generalized strengthening, dynamic standing balance/coordination, gait training, stair navigation, toileting, and improved activity tolerance. O2 sat 99% on 2L O2 at rest. Sit<>stand with SPC and supervision and ambulated 60ft with SPC and supervision to [name]. Pt transported to/from room in [name] dependently for time management purposes. Pt navigated 8 steps with 2 rails and CGA/close supervision with 1 extended standing rest break after first 4 steps - pt ascending and descending with a step to pattern with 1 minor LOB with L hand slipped off railing, but pt able to self-correct. Sit<>stand with SPC and supervision and ambulated 148ft x 1 and 48ft x 1 with SPC and CGA/close supervision with 2 extended standing rest breaks - O2 sat 88% increasing to 93% with 1-2 minute seated rest break. Pt ambulates with decreased cadence, wide BOS, and decreased bilateral foot clearance. Returned to room and pt ambulated in/out of bathroom with [name] and close supervision and able to perform all toileting tasks with supervision. Concluded session with pt sitting in recliner with all needs within reach.  Therapy Documentation Precautions:  Precautions Precautions: Fall Precaution Comments: 3L O2 Restrictions Weight Bearing Restrictions: No RLE Weight Bearing: Weight bearing as tolerated LLE Weight Bearing: Weight bearing as tolerated  Therapy/Group: Individual Therapy Alfonse Alpers PT, DPT   03/08/2021, 7:21 AM

## 2021-03-08 NOTE — Progress Notes (Signed)
Pt has home CPAP unit at bedside and will apply when she is ready for rest.

## 2021-03-09 DIAGNOSIS — R5381 Other malaise: Secondary | ICD-10-CM | POA: Diagnosis not present

## 2021-03-09 LAB — HEPATITIS B SURFACE ANTIBODY, QUANTITATIVE: Hep B S AB Quant (Post): <3.1 m[IU]/mL — ABNORMAL LOW (ref >9.9)

## 2021-03-09 LAB — GLUCOSE, CAPILLARY
Glucose-Capillary: 101 mg/dL — ABNORMAL HIGH (ref 70–99)
Glucose-Capillary: 122 mg/dL — ABNORMAL HIGH (ref 70–99)
Glucose-Capillary: 109 mg/dL — ABNORMAL HIGH (ref 70–99)
Glucose-Capillary: 119 mg/dL — ABNORMAL HIGH (ref 70–99)

## 2021-03-09 MED ORDER — SEVELAMER CARBONATE 800 MG PO TABS
800.0000 mg | ORAL_TABLET | Freq: Three times a day (TID) | ORAL | Status: DC
  Administered 2021-03-09 – 2021-03-16 (×21): 800 mg via ORAL
  Filled 2021-03-09 (×23): qty 1

## 2021-03-09 NOTE — Progress Notes (Signed)
Physical Therapy Session Note  Patient Details  Name: Nicole Schmidt MRN: [id number] Date of Birth: 1965/03/02  Today's Date: 03/09/2021 PT Individual Time: 1638-4665 PT Individual Time Calculation (min): 74 min   Short Term Goals: Week 1:  PT Short Term Goal 1 (Week 1): STG=LTG due to LOS  Skilled Therapeutic Interventions/Progress Updates:   Received pt sitting in recliner sleeping. Pt required significantly increased time and maximal verbal/tactile stimuli to arouse. Upon wakening, pt agreeable to PT treatment and denied any pain during session. Session with emphasis on functional mobility, dressing, generalized strengthening and endurance, dynamic standing balance/coordination, and gait training. Pt on 2L O2 via [place] with sats >84% on 2L with activity. Donned underwear and pants sitting with max A to thread LEs through due to body habitus but pt able to stand and pull clothing over hips with close supervision/CGA. Sit<>stand with SPC and supervision and ambulated to sink with SPC and close supervision. Pt stood at sink and brushed teeth with distant supervision. Pt transported to/from room in [facility] dependently for time management and energy conservation purposes. Sit<>stand with SPC and supervision and ambulated 131ft x 1 and 43ft x 1 - 1 extended standing rest break. Discussed energy conservation techniques for when out in the community (specifically concert she plans on attending in Feb). After extended rest break ambulated additional 170ft using SPC and close supervision with 2 standing rest breaks. Worked on blocked practice sit<>stands 2x10 reps to fatigue with 1 UE support and close supervision (unable to stand without UE support). Stand<>pivot WC<>recliner with SPC and supervision. Concluded session with pt sitting in recliner with all needs within reach.   Therapy Documentation Precautions:  Precautions Precautions: Fall Precaution Comments: 3L O2 Restrictions Weight Bearing Restrictions:  No RLE Weight Bearing: Weight bearing as tolerated LLE Weight Bearing: Weight bearing as tolerated  Therapy/Group: Individual Therapy Alfonse Alpers PT, DPT   03/09/2021, 7:22 AM

## 2021-03-09 NOTE — Progress Notes (Signed)
Physical Therapy Session Note  Patient Details  Name: Nicole Schmidt MRN: [id number] Date of Birth: 1965/04/02  Today's Date: 03/09/2021 PT Individual Time: 7943-2761 PT Individual Time Calculation (min): 24 min   Short Term Goals: Week 1:  PT Short Term Goal 1 (Week 1): STG=LTG due to LOS  Skilled Therapeutic Interventions/Progress Updates:   Pt received sitting in recliner and agreeable to PT. PT treatment focused on activity tolerance and safety with gait training. Sit<>stand performed x 5 throughout session with supervision assist and SPC. Gait training with SPC and supervision  x 133f with 4 standing rest break. Pt on 2L/min throughout with mild SOB prior to each rest break. Pt's WC wheel lock is broken. PT replaced WC to allow improved safety [facility] returned to room and performed stand pivot [facility]. Pt left sitting in recliner with call bell in reach and all needs met.         Therapy Documentation Precautions:  Precautions Precautions: Fall Precaution Comments: 3L O2 Restrictions Weight Bearing Restrictions: No RLE Weight Bearing: Weight bearing as tolerated LLE Weight Bearing: Weight bearing as tolerated    Vital Signs: Therapy Vitals Temp: 97.7 F (36.5 C) Pulse Rate: 64 Resp: 18 Patient Position (if appropriate): Sitting Oxygen Therapy SpO2: 100 % O2 Device: Nasal Cannula Pain: Pain Assessment Pain Scale: 0-10 Pain Score: 0-No pain   Therapy/Group: Individual Therapy  ALorie Phenix1/25/2023, 5:01 PM

## 2021-03-09 NOTE — Progress Notes (Signed)
Pt has home CPAP at bedside and applies the CPAP when she is ready for bed.

## 2021-03-09 NOTE — Progress Notes (Signed)
Patient ID: Nicole Schmidt, female   DOB: [birth year], 56 y.o.   MRN: [id number] Team Conference Report to Patient/Family  Team Conference discussion was reviewed with the patient and caregiver, including goals, any changes in plan of care and target discharge date.  Patient and caregiver express understanding and are in agreement.  The patient has a target discharge date of 03/16/21.  SW met with patient and provided conference updates. Patient making good progress. Agreeable to discharge date but wants to ensure she will have transportation.  Dyanne Iha 03/09/2021, 2:23 PM

## 2021-03-09 NOTE — Progress Notes (Signed)
[facility] Progress Note  Subjective:  Completed dialysis yesterday -no issues. Net UF 2.8L   Vitals:   03/08/21 1640 03/08/21 2017 03/09/21 0500 03/09/21 0532  BP: 131/64 (!) 137/54  131/70  Pulse:  70  75  Resp: (!) 22 17  20   Temp: 98.2 F (36.8 C) 98.3 F (36.8 C)  97.9 F (36.6 C)  TempSrc:  Oral    SpO2: (!) 22% 98%  93%  Weight: (!) 164.9 kg  (!) 164.5 kg   Height:        Exam:   alert, nad , obese  no jvd  Chest cta bilat  Cor reg no RG  Abd soft ntnd no ascites   Ext diffuse 2+ bilat LE edema   Alert, NF, ox3   RIJ TDC in place  Accepted to [facility] TTS    Assessment/ Plan: Acute Kidney Injury on chronic kidney disease stage IV: baseline creatinine of 2.43 in Nov 2022. Dialysis initiated on 02/25/21. Arranged for outpatient HD at [facility] on TTS schedule. Transferred [place] to CIR at [facility] for rehab.  No evidence of recovery. Continue HD on TTS schedule here. Next HD 1/26.  Hypertension w/ chronic kidney disease: On amlodipine, clonidine and metoprolol here. BP stable. Sig volume overload w/ diffuse LE edema. High UF goals w/ HD. Get standing weights.  Anemia of chronic kidney disease: Hgb 7.5. s/p prbcs. Tsat 11 Ferritin 273 -will replete iron here. Aranesp 25 q week started 1/23.  Hematuria: Ureteral stent placed by urology on 02/14/2021.  With recent episode of nephrolithiasis.  No further reports of hematuria reports Secondary Hyperparathyroidism: Ca acceptable. On calcitriol. Phos 8.4 --Will start Renvela binder  Acute respiratory failure: increased O2 needed initially from her baseline of 2 liters. Weaned to 2L now. Still vol overload.    Lynnda Child PA-C [facility] 03/09/2021,1:39 PM  Recent Labs  Lab 03/03/21 1023 03/04/21 0746 03/05/21 1017 03/08/21 0545  K 3.3*   < > 3.9 4.1  BUN 44*   < > 70* 71*  CREATININE 3.98*   < > 6.04* 6.61*  CALCIUM 7.6*   < > 8.4* 8.2*  PHOS 4.7*  --   --  8.4*   HGB 7.4*   < > 7.5* 7.5*   < > = values in this interval not displayed.    Inpatient medications:  amLODipine  5 mg Oral BID   apixaban  5 mg Oral BID   atorvastatin  20 mg Oral QHS   calcitRIOL  0.25 mcg Oral Daily   calcium carbonate  500 mg of elemental calcium Oral Q breakfast   Chlorhexidine Gluconate Cloth  6 each Topical Daily   cloNIDine  0.2 mg Oral BID   Darbepoetin Alfa  25 mcg Intravenous Once per day on Mon Wed Fri   folic acid  0,998 mcg Oral Daily   gabapentin  300 mg Oral QHS   hydrocerin   Topical BID   insulin aspart  0-9 Units Subcutaneous TID AC & HS   levothyroxine  112 mcg Oral QAC breakfast   metoprolol tartrate  100 mg Oral BID   [START ON 03/11/2021] Vitamin D (Ergocalciferol)  50,000 Units Oral Q Fri    ferric gluconate (FERRLECIT) IVPB Stopped (03/09/21 1256)   acetaminophen **OR** acetaminophen, albuterol, bisacodyl, melatonin, ondansetron **OR** ondansetron (ZOFRAN) IV, traZODone

## 2021-03-09 NOTE — Plan of Care (Signed)
°  Problem: Consults Goal: RH GENERAL PATIENT EDUCATION Description: See Patient Education module for education specifics. Outcome: Progressing   Problem: RH BOWEL ELIMINATION Goal: RH STG MANAGE BOWEL WITH ASSISTANCE Description: STG Manage Bowel with mod I Assistance. Outcome: Progressing Goal: RH STG MANAGE BOWEL W/MEDICATION W/ASSISTANCE Description: STG Manage Bowel with Medication with mod I Assistance. Outcome: Progressing   Problem: RH SAFETY Goal: RH STG ADHERE TO SAFETY PRECAUTIONS W/ASSISTANCE/DEVICE Description: STG Adhere to Safety Precautions With  cues Assistance/Device. Outcome: Progressing   Problem: RH PAIN MANAGEMENT Goal: RH STG PAIN MANAGED AT OR BELOW PT'S PAIN GOAL Description: At or below level 4 with prns Outcome: Progressing   Problem: RH KNOWLEDGE DEFICIT GENERAL Goal: RH STG INCREASE KNOWLEDGE OF SELF CARE AFTER HOSPITALIZATION Description: Patient will be able to manage care , medications, using handouts and educational materials independently Outcome: Progressing   Problem: RH KNOWLEDGE DEFICIT Goal: RH STG INCREASE KNOWLEDGE OF DIABETES Description: Patient will be able to manage DM with medications and dietary modifications using handouts and educational materials independently Outcome: Progressing Goal: RH STG INCREASE KNOWLEDGE OF HYPERTENSION Description: Patient will be able to manage HTN with medications and dietary modifications using handouts and educational materials independently Outcome: Progressing Goal: RH STG INCREASE KNOWLEGDE OF HYPERLIPIDEMIA Description: Patient will be able to manage HLD with medications and dietary modifications using handouts and educational materials independently Outcome: Progressing

## 2021-03-09 NOTE — Progress Notes (Signed)
PROGRESS NOTE   Subjective/Complaints: She has no new complaints She asks about recent creatinine function  Her pain is better Discussed hepatitis result.  ROS: foot pain improved, sleep improved, mobility improving.  Objective:   No results found. Recent Labs    03/08/21 0545  WBC 11.0*  HGB 7.5*  HCT 24.9*  PLT 214   Recent Labs    03/08/21 0545  NA 130*  K 4.1  CL 96*  CO2 23  GLUCOSE 111*  BUN 71*  CREATININE 6.61*  CALCIUM 8.2*    Intake/Output Summary (Last 24 hours) at 03/09/2021 1344 Last data filed at 03/09/2021 1312 Gross per 24 hour  Intake 816 ml  Output 3279 ml  Net -2463 ml        Physical Exam: Vital Signs Blood pressure 131/70, pulse 75, temperature 97.9 F (36.6 C), resp. rate 20, height 5\' 4"  (1.626 m), weight (!) 164.5 kg, last menstrual period 07/17/2012, SpO2 93 %. Gen: no distress, normal appearing HEENT: oral mucosa pink and moist, nasal cannula in place Cardio: Irregular Chest: normal effort, normal rate of breathing, diminished breath sounds bilaterally, O2 sat down to 84 with activity, recovers quickly Abd: soft, non-distended Ext: 1+ bilateral lower extremity edema Psych: pleasant, normal affect Skin: RIJ permacath C/D/I, dry and scaly skin on bilateral feet. Neurological:     Comments: Patient is alert.  No acute distress.  Mood is a bit flat but appropriate.  Follows commands.  Oriented x3. Allodynia over lateral aspect of right foot- improved. Decreased sensation bilateral feet   Assessment/Plan: 1. Functional deficits which require 3+ hours per day of interdisciplinary therapy in a comprehensive inpatient rehab setting. Physiatrist is providing close team supervision and 24 hour management of active medical problems listed below. Physiatrist and rehab team continue to assess barriers to discharge/monitor patient progress toward functional and medical goals  Care  Tool:  Bathing    Body parts bathed by patient: Face (UB and LB Bathing per OT Note completed with Supervision)         Bathing assist Assist Level: Supervision/Verbal cueing     Upper Body Dressing/Undressing Upper body dressing   What is the patient wearing?: Hospital gown only    Upper body assist Assist Level: Independent    Lower Body Dressing/Undressing Lower body dressing      What is the patient wearing?: Pants, Underwear/pull up     Lower body assist Assist for lower body dressing: Supervision/Verbal cueing (wtih reacher)     Toileting Toileting Toileting Activity did not occur (Clothing management and hygiene only): N/A (no void or bm)  Toileting assist Assist for toileting: Contact Guard/Touching assist     Transfers Chair/bed transfer  Transfers assist  Chair/bed transfer activity did not occur: Refused (pt sleeps in chair)  Chair/bed transfer assist level: Supervision/Verbal cueing     Locomotion Ambulation   Ambulation assist      Assist level: Supervision/Verbal cueing Assistive device: Cane-straight Max distance: 158ft   Walk 10 feet activity   Assist     Assist level: Supervision/Verbal cueing Assistive device: Cane-straight   Walk 50 feet activity   Assist Walk 50 feet with 2 turns activity  did not occur: Safety/medical concerns (fatigue, R foot pain, weakness)  Assist level: Supervision/Verbal cueing Assistive device: Cane-straight    Walk 150 feet activity   Assist Walk 150 feet activity did not occur: Safety/medical concerns (fatigue, R foot pain, weakness)  Assist level: Supervision/Verbal cueing Assistive device: Walker-rolling    Walk 10 feet on uneven surface  activity   Assist Walk 10 feet on uneven surfaces activity did not occur: Safety/medical concerns (fatigue, R foot pain, weakness)         Wheelchair     Assist Is the patient using a wheelchair?: Yes Type of Wheelchair: Manual     Wheelchair assist level: Supervision/Verbal cueing Max wheelchair distance: 70ft    Wheelchair 50 feet with 2 turns activity    Assist        Assist Level: Moderate Assistance - Patient 50 - 74% (per PT documentation)   Wheelchair 150 feet activity     Assist      Assist Level: Total Assistance - Patient < 25% (Per PT documentation)   Blood pressure 131/70, pulse 75, temperature 97.9 F (36.6 C), resp. rate 20, height 5\' 4"  (1.626 m), weight (!) 164.5 kg, last menstrual period 07/17/2012, SpO2 93 %.    Medical Problem List and Plan: 1. Functional deficits/debility secondary to acute on chronic renal failure superimposed with diastolic congestive heart failure             -patient may shower             -ELOS/Goals: 10-14 days modI             -Continue CIR  -Interdisciplinary Team Conference today   2.  Antithrombotics: -DVT/anticoagulation:  Pharmaceutical: Other (comment) Eliquis             -antiplatelet therapy: N/A 3. Right foot pain: Tylenol as needed. D/c Dilaudid, Add gabapentin HS, continue. Discussed that her pain and mobility have improved.  4. Insomnia: continue Gabapentin HS. Melatonin as needed             -antipsychotic agents: N/A 5. Neuropsych: This patient is capable of making decisions on her own behalf. 6. Skin/Wound Care: Routine skin checks 7. Fluids/Electrolytes/Nutrition: Routine in and outs with follow-up chemistries 8.  End-stage renal disease.  Permacath placed 02/25/2021 hemodialysis initiated. Discussed recent creatinine level with her 9.  History of hematuria.  Left ureteral stone with recent uteroscopy/laser lithotripsy.  Follow-up urology Dr Diamantina [place] 10.  Hypothyroidism.  Synthroid 11.  Diabetes mellitus.  Hemoglobin A1c 7.2.  SSI. 12.  Hypothyroidism.  Continue Synthroid 13.  Hypertension.  Provided with list of foods that can help to lower blood pressure. Continue Clonidine 0.2 mg twice daily, Norvasc 5 mg twice daily.  Monitor  with increased mobility 14.  Hyperlipidemia.  Lipitor 15.  Atrial fibrillation/history of pulmonary emboli. Continue Eliquis.  Lopressor 100 mg twice daily 16.  Acute on chronic anemia.  Transfuse 1 unit RBCs 03/04/2021.  Follow-up CBC 17.  Obesity.  BMI 62.17.  Dietary follow-up 18.  Chronic hypoxemic respiratory failure.  Continue oxygen therapy as directed as well as CPAP 19. Constipation: type 2 stool 1/21.  20. Dry skin: order Eucerin cream.    LOS: 5 days A FACE TO FACE EVALUATION WAS PERFORMED  Martha Clan P Margrett Kalb 03/09/2021, 1:44 PM

## 2021-03-09 NOTE — Patient Care Conference (Cosign Needed Addendum)
Inpatient RehabilitationTeam Conference and Plan of Care Update Date: 03/09/2021   Time: 12:17 PM    Patient Name: Nicole Schmidt      Medical Record Number: [id number]  Date of Birth: [birth year] Sex: Female         Room/Bed: 4Y70W/2B76E-83 Payor Info: Payor: [place] / Plan: [employer] COMM PPO / Product Type: *No Product type* /    Admit Date/Time:  03/04/2021  5:12 PM  Primary Diagnosis:  [place] [facility] Problems: Principal Problem:   Debility    Expected Discharge Date: Expected Discharge Date: 03/16/21  Team Members Present: Physician leading conference: Dr. Leeroy Cha Social Worker Present: Erlene Quan, BSW Nurse Present: Other (comment) Deidre Ala) PT Present: Becky Sax, PT OT Present: Other (comment) [facility] Alphonsa Gin) SLP Present: Charolett Bumpers, SLP     Current Status/Progress Goal Weekly Team Focus  Bowel/Bladder   cont of B/B, last BM-1/22. pt on HD, but still has low urine output  remain cont  assess q shift and PRN   Swallow/Nutrition/ Hydration             ADL's   supervision bathing, set up A dressing, supervision toileting, CGA ambulatory transfers with [facility] for pt comfort  mod I  BUE strength, endurance, breathing techniques, home management   Mobility   transfers with [facility] CGA/supervision, gait 155ft with SPC CGA, 4 steps 2 rails CGA  mod I  functional mobility/transfers, generalized strengthening and endurance, dynamic standing balance/coordination, gait training, stair navigation, D/C planning   Communication             Safety/Cognition/ Behavioral Observations            Pain   denies pain  pain<3  assess pain q shift   Skin   red to BLE- cellulites, dry BLE- eucerin  remain intact  assess skin q shift and PRN     Discharge Planning:  discharging home alone with PRN assistance from family Manuela Schwartz and [place]). Intermittent A at d/c   Team Discussion: Patient to d/c home alone with friends to help as needed. Home environment  very adaptive. Neurontin helping with pain and trouble sleeping. Patient needing 02, desats with activity on 2 L but recovers quickly.  Patient on target to meet rehab goals: yes, May upgrade OT goals to [place] and progress notes for long and short-term goals.   Revisions to Treatment Plan:  OT may upgrade goals to independent from Mod I  Teaching Needs: Obesity, T2 Diabetes management, Diet, Medication management, Hemodialysis, Skin care  Current Barriers to Discharge: Lack of/limited family support  Possible Resolutions to Barriers: Friends provide assistance as needed.     Medical Summary Current Status: OSA, morbid obesity, labile blood pressure, tachypnea, lower extremity pain, insomnia, type 2 DM, oxygen desaturation  Barriers to Discharge: Medical stability  Barriers to Discharge Comments: OSA, morbid obesity, labile blood pressure, tachypnea, lower extremity pain, insomnia, type 2 DM, oxygen desaturation Possible Resolutions to [employer] Focus: continue CPAP at night, continue dietary education, continue clonidine and monitoring of blood pressure TID, continue gabapentin, continue CBG checks   Continued Need for Acute Rehabilitation Level of Care: The patient requires daily medical management by a physician with specialized training in physical medicine and rehabilitation for the following reasons: Direction of a multidisciplinary physical rehabilitation program to maximize functional independence : Yes Medical management of patient stability for increased activity during participation in an intensive rehabilitation regime.: Yes Analysis of laboratory values and/or radiology  reports with any subsequent need for medication adjustment and/or medical intervention. : Yes   I attest that I was present, lead the team conference, and concur with the assessment and plan of the team.   Barrett Shell 03/09/2021, 12:17 PM

## 2021-03-09 NOTE — Progress Notes (Signed)
Occupational Therapy Session Note  Patient Details  Name: Nicole Schmidt MRN: [id number] Date of Birth: 1965-03-19  Today's Date: 03/09/2021 OT Individual Time: 7564-3329 & 5188-4166 OT Individual Time Calculation (min): 23 min & 53 min   Short Term Goals: Week 1:  OT Short Term Goal 1 (Week 1): STG = LTG 2/2 ELOS  Skilled Therapeutic Interventions/Progress Updates:  Session 1 Skilled OT intervention completed with focus on BUE strengthening for overall endurance for self-care once home. Pt with [name] on, 2L, with 93% sat, remained on 2L throughout session without drop in sat, however educated on pursed lip breathing during phases of exercises to prevent holding breath. Pt received seated in recliner, agreeable to session. Nurse in room to administer meds. Pt participated in seated BUE strengthening to further educate on pt's HEP handout, including the following with orange theraband:  Shoulder external rotation 2x15, shoulder extension 2x15 each arm, shoulder diagonal pulls 2x15. Cues needed for form and positioning throughout. Pt left seated in recliner, with chair alarm on and all needs in reach at end of session.   Session 2 Skilled OT intervention completed with focus on functional endurance, dynamic balance, activity tolerance, breathing strategy education. Pt received seated in recliner, agreeable to session. Pt sit > stand and ambulatory transfer using [facility] and supervision to w/c, as well as for all transfers during session. Transported pt with total A in w/c <> day room for cornhole activity to promote endurance with functional ambulation and dynamic balance needed for self-care tasks. Pt with , on 2L, with 97% sat. Pt remained on 2L, however did desat to 89% at lowest during ambulatory tasks with pt required up to 1 min seated/standing rest break with breathing techniques to increase SPO2 back to >93%. Pt only able to ambulate a total of about 25 ft to retrieve 2-3 total bean bags (1 at a  time) before needed rest break. Pt able to retrieve 10 beans bags total through entire session, with breaks included, demonstrating very limited endurance. Educated pt on importance of increasing endurance for ADL completion, especially showering. Pt able to toss all bean bags in one standing round with good balance demonstrated. Pt left seated in recliner in room, with chair alarm on and all needs in reach at end of session.    Therapy Documentation Precautions:  Precautions Precautions: Fall Precaution Comments: 3L O2 Restrictions Weight Bearing Restrictions: No RLE Weight Bearing: Weight bearing as tolerated LLE Weight Bearing: Weight bearing as tolerated  Pain: No c/o pain   Therapy/Group: Individual Therapy  Sota Hetz E Thia Olesen 03/09/2021, 7:52 AM

## 2021-03-09 NOTE — Progress Notes (Signed)
Spoke to [place] at [facility] [employer] to make clinic aware that navigator will contact clinic with pt's d/c date once known. Pt will be a new start at [facility] at d/c from CIR. Will assist as needed.   Melven Sartorius Renal Navigator [phone]

## 2021-03-10 DIAGNOSIS — R5381 Other malaise: Secondary | ICD-10-CM | POA: Diagnosis not present

## 2021-03-10 LAB — CBC
HCT: 26.6 % — ABNORMAL LOW (ref 36.0–46.0)
Hemoglobin: 8 g/dL — ABNORMAL LOW (ref 12.0–15.0)
MCH: 27.3 pg (ref 26.0–34.0)
MCHC: 30.1 g/dL (ref 30.0–36.0)
MCV: 90.8 fL (ref 80.0–100.0)
Platelets: 228 10*3/uL (ref 150–400)
RBC: 2.93 MIL/uL — ABNORMAL LOW (ref 3.87–5.11)
RDW: 15.3 % (ref 11.5–15.5)
WBC: 11.9 10*3/uL — ABNORMAL HIGH (ref 4.0–10.5)
nRBC: 0.4 % — ABNORMAL HIGH (ref 0.0–0.2)

## 2021-03-10 LAB — RENAL FUNCTION PANEL
Albumin: 2.5 g/dL — ABNORMAL LOW (ref 3.5–5.0)
Anion gap: 9 (ref 5–15)
BUN: 29 mg/dL — ABNORMAL HIGH (ref 6–20)
CO2: 26 mmol/L (ref 22–32)
Calcium: 7.9 mg/dL — ABNORMAL LOW (ref 8.9–10.3)
Chloride: 95 mmol/L — ABNORMAL LOW (ref 98–111)
Creatinine, Ser: 3.49 mg/dL — ABNORMAL HIGH (ref 0.44–1.00)
GFR, Estimated: 15 mL/min — ABNORMAL LOW (ref >60)
Glucose, Bld: 197 mg/dL — ABNORMAL HIGH (ref 70–99)
Phosphorus: 4 mg/dL (ref 2.5–4.6)
Potassium: 3.6 mmol/L (ref 3.5–5.1)
Sodium: 130 mmol/L — ABNORMAL LOW (ref 135–145)

## 2021-03-10 LAB — GLUCOSE, CAPILLARY
Glucose-Capillary: 126 mg/dL — ABNORMAL HIGH (ref 70–99)
Glucose-Capillary: 102 mg/dL — ABNORMAL HIGH (ref 70–99)
Glucose-Capillary: 178 mg/dL — ABNORMAL HIGH (ref 70–99)

## 2021-03-10 MED ORDER — HEPARIN SODIUM (PORCINE) 1000 UNIT/ML DIALYSIS: 20.0000 [IU]/kg | INTRAMUSCULAR | Status: DC | PRN

## 2021-03-10 MED ORDER — TRAZODONE HCL 50 MG PO TABS
50.0000 mg | ORAL_TABLET | Freq: Every evening | ORAL | Status: DC | PRN
Start: 2021-03-10 — End: 2021-03-14
  Administered 2021-03-11 – 2021-03-13 (×4): 50 mg via ORAL
  Filled 2021-03-10 (×4): qty 1

## 2021-03-10 MED ORDER — SODIUM CHLORIDE 0.9 % IV SOLN: 100.0000 mL | INTRAVENOUS | Status: DC | PRN

## 2021-03-10 MED ORDER — DARBEPOETIN ALFA 25 MCG/0.42ML IJ SOSY: 25.0000 ug | PREFILLED_SYRINGE | INTRAMUSCULAR | Status: DC

## 2021-03-10 MED ORDER — PENTAFLUOROPROP-TETRAFLUOROETH EX AERO: 1.0000 "application " | INHALATION_SPRAY | CUTANEOUS | Status: DC | PRN

## 2021-03-10 MED ORDER — SODIUM CHLORIDE 0.9 % IV SOLN
250.0000 mg | Freq: Once | INTRAVENOUS | Status: AC
  Administered 2021-03-10: 250 mg via INTRAVENOUS
  Filled 2021-03-10: qty 20

## 2021-03-10 MED ORDER — LIDOCAINE HCL (PF) 1 % IJ SOLN: 5.0000 mL | INTRAMUSCULAR | Status: DC | PRN

## 2021-03-10 MED ORDER — ALTEPLASE 2 MG IJ SOLR: 2.0000 mg | Freq: Once | INTRAMUSCULAR | Status: DC | PRN

## 2021-03-10 MED ORDER — HEPARIN SODIUM (PORCINE) 1000 UNIT/ML DIALYSIS: 1000.0000 [IU] | INTRAMUSCULAR | Status: DC | PRN

## 2021-03-10 MED ORDER — LIDOCAINE-PRILOCAINE 2.5-2.5 % EX CREA: 1.0000 "application " | TOPICAL_CREAM | CUTANEOUS | Status: DC | PRN

## 2021-03-10 MED ORDER — HEPARIN SODIUM (PORCINE) 1000 UNIT/ML IJ SOLN
INTRAMUSCULAR | Status: AC
  Administered 2021-03-10: 1000 [IU]
  Filled 2021-03-10: qty 5

## 2021-03-10 NOTE — Progress Notes (Signed)
Occupational Therapy Session Note  Patient Details  Name: MARIELENA HARVELL MRN: [id number] Date of Birth: 1965/08/09  Today's Date: 03/10/2021 OT Individual Time: 1000-1100 OT Individual Time Calculation (min): 60 min    Short Term Goals: Week 1:  OT Short Term Goal 1 (Week 1): STG = LTG 2/2 ELOS Week 2:     Skilled Therapeutic Interventions/Progress Updates:    Patient seen this A.M.  Patient seated in recliner upon arrival, with legs positioned on the floor, pt was instructed in edema management in relation to BLE and the best position regarding leg placement regarding reducing the incidence for edema.  The pt was instructed in retrograde massage to improve circulation as well.  The pt completed BADL task in bathing at sink level, she was able to wash her UB and her LB with  s/u  and Max A for her bottom feet and back.  The pt asked if her hair could be washed during the treatment session, she was able to tolerate a sitting position without c/o pain and she combed her hair with s/u assist.   The pt dressed her UB/LB with Min-ModA for donning LB garments and was able to come from sit to stand at w/c LOF with S only. The pt maintained a O2 saturation of 97% throughout the treatment session with O2 in place.  The pt returned to the recliner using a stand pivot transfer with SBA only.  The pt's call light, and beside table was in place and she had no c/o pain at the time of treatment. Therapy Documentation Precautions:  Precautions Precautions: Fall Precaution Comments: 3L O2 Restrictions Weight Bearing Restrictions: No RLE Weight Bearing: Weight bearing as tolerated LLE Weight Bearing: Weight bearing as tolerated General:   Vital Signs: Oxygen Therapy O2 Device: CPAP;Nasal Cannula O2 Flow Rate (L/min): 2 L/min Patient Activity (if Appropriate): In chair Pain: Pain Assessment Pain Scale: 0-10 ADL: ADL Eating: Independent Where Assessed-Eating: Chair Grooming:  Supervision/safety Where Assessed-Grooming: Standing at sink Upper Body Bathing: Independent Where Assessed-Upper Body Bathing: Sitting at sink Lower Body Bathing: Supervision/safety Where Assessed-Lower Body Bathing: Standing at sink Upper Body Dressing: Independent Where Assessed-Upper Body Dressing: Chair Lower Body Dressing: Supervision/safety Where Assessed-Lower Body Dressing: Chair Toileting: Supervision/safety Where Assessed-Toileting: Glass blower/designer: Close supervision Toilet Transfer Method: Counselling psychologist: Energy manager: Not assessed Social research officer, government: Close supervision Social research officer, government Method: Heritage manager: Estate agent   Exercises:   Other Treatments:     Therapy/Group: Individual Therapy  [name] 03/10/2021, 11:16 AM

## 2021-03-10 NOTE — Progress Notes (Signed)
Physical Therapy Session Note  Patient Details  Name: Nicole Schmidt MRN: [id number] Date of Birth: [birth year]  Today's Date: 03/10/2021 PT Individual Time: 8841-6606 PT Individual Time Calculation (min): 53 min   Short Term Goals: Week 1:  PT Short Term Goal 1 (Week 1): STG=LTG due to LOS  Skilled Therapeutic Interventions/Progress Updates:  Pt received seated in recliner in room, on 2L O2, handoff w/OT. Pt denied pain but reported soreness in dorsum of B feet. Emphasis of session on improved cardiovascular endurance, BLE strength and activity tolerance. Pt's SpO2 monitored throughout session and maintained between 92%-99% on 2L O2. Pt performed 2x10 sit <>stands w/S* and RLE support only, short seated rest break taken in between. Min verbal cues for pursed lip breathing throughout for paced breathing and energy conservation. Noted good anterior weight shift and pushing through BLEs. Progressed to alt. Standing marches, 2x60s, w/RUE support on [facility] and S* for improved single leg stability, hip flexor and glute strength. Noted uncompensated Trendelenburg when standing on RLE, educated pt on glute med weakness and impact on gait, pt verbalized understanding.   Lengthy discussion regarding pt's DC plan and accommodations pt is making to her home to improve accessibility. Pt reported her greatest concern is navigating her stairs to the back entrance of her home and setting up utilities while in hospital. Educated pt on importance of glute strength and continued BLE strength to build confidence, as well as completing a walking program at home for improved cardiovascular endurance. Pt was left seated in recliner in room, BLES elevated, all needs in reach.   Therapy Documentation Precautions:  Precautions Precautions: Fall Precaution Comments: 3L O2 Restrictions Weight Bearing Restrictions: No RLE Weight Bearing: Weight bearing as tolerated LLE Weight Bearing: Weight bearing as  tolerated   Therapy/Group: Individual Therapy Cruzita Lederer Fadi Menter, PT, DPT  03/10/2021, 7:50 AM

## 2021-03-10 NOTE — Progress Notes (Signed)
[place] and spoke to [employer]. Clinic advised that pt will d/c from rehab on 2/1 and will start at the clinic on 2/2. Pt needs to arrive at 10:15 to complete paperwork for 10:45 chair time. Spoke to pt via phone to make her aware of this info. Pt agreeable to plan. Pt plans to speak to MD as to whether she will be able to drive self to HD and if not, pt states she has friends that can assist if needed. Will assist as needed.   Melven Sartorius Renal Navigator [phone]

## 2021-03-10 NOTE — Progress Notes (Signed)
Physical Therapy Session Note  Patient Details  Name: Nicole Schmidt MRN: [id number] Date of Birth: 07/27/1965  Today's Date: 03/10/2021 PT Individual Time: 9833-8250 PT Individual Time Calculation (min): 69 min   Short Term Goals: Week 1:  PT Short Term Goal 1 (Week 1): STG=LTG due to LOS  Skilled Therapeutic Interventions/Progress Updates:   Received pt sitting in recliner, pt agreeable to PT treatment, and reported mild pain along tops of bilateral feet but unrated - Notified NT to contact RN and RN arrived at end of session. Session with emphasis on functional mobility, toileting, dressing, generalized strengthening and endurance, dynamic standing balance/coordination, and gait training. Pt on 2L O2 via [place] with mild SOB during session with activity, however pulse ox not accurate therefore modified based on pt's symptoms. Sit<>stand with SPC and supervision and ambulated in/out of bathroom with SPC and supervision. Pt able to perform all toileting tasks with supervision and stood at sink and brushed teeth/washed hands with distant supervision. Donned underwear and pants sitting in recliner with max A to thread LE's through for time management purposes due to body habitus. Sit<>stand without AD and supervision to pull pants over hips - MD arrived for morning rounds. Sit<>stand with SPC and supervision and ambulated 14ft x 1 (with 1 standing rest break), 25ft x 1 (with 1 standing rest break), and 8ft x 1 with SPC and supervision with total A to manage O2 tank and WC. Pt ambulates with wide BOS, decreased cadence, and decreased bilateral foot clearance with cues for energy conservation strategies. Pt transported back to room in [facility] dependently (took long route to enjoy sunshine through [employer]). Pt performed the following exercises sitting in recliner with supervision and verbal cues for technique with emphasis on LE strength/ROM: -hip flexion 2x12 bilaterally -LAQ 2x12 bilaterally -hip adduction  pillow squeezes 2x20 Concluded session with pt sitting in recliner with all needs within reach.   Therapy Documentation Precautions:  Precautions Precautions: Fall Precaution Comments: 3L O2 Restrictions Weight Bearing Restrictions: No RLE Weight Bearing: Weight bearing as tolerated LLE Weight Bearing: Weight bearing as tolerated  Therapy/Group: Individual Therapy Alfonse Alpers PT, DPT   03/10/2021, 7:25 AM

## 2021-03-10 NOTE — Progress Notes (Signed)
PROGRESS NOTE   Subjective/Complaints: No new complaints today.  Foot pain improved/resolved Sleeping poorly at night. Hopeful she will get to sleep again in dialysis today  ROS: foot pain improved, sleep improved, mobility improving, breathing improving  Objective:   No results found. Recent Labs    03/08/21 0545  WBC 11.0*  HGB 7.5*  HCT 24.9*  PLT 214   Recent Labs    03/08/21 0545  NA 130*  K 4.1  CL 96*  CO2 23  GLUCOSE 111*  BUN 71*  CREATININE 6.61*  CALCIUM 8.2*    Intake/Output Summary (Last 24 hours) at 03/10/2021 1846 Last data filed at 03/10/2021 1825 Gross per 24 hour  Intake 902 ml  Output 350 ml  Net 552 ml        Physical Exam: Vital Signs Blood pressure (!) 141/58, pulse 77, temperature (!) 97.5 F (36.4 C), temperature source Oral, resp. rate 20, height 5\' 4"  (1.626 m), weight (!) 166.5 kg, last menstrual period 07/17/2012, SpO2 100 %. Gen: no distress, normal appearing HEENT: oral mucosa pink and moist, nasal cannula in place Cardio: Irregular Chest: normal effort, normal rate of breathing, diminished breath sounds bilaterally, O2 sat down to 84 with activity, recovers quickly Abd: soft, non-distended Ext: 2+ bilateral lower extremity edema Psych: pleasant, normal affect Skin: RIJ permacath C/D/I, dry and scaly skin on bilateral feet. Neurological:     Comments: Patient is alert.  No acute distress.  Mood is a bit flat but appropriate.  Follows commands.  Oriented x3. Allodynia over lateral aspect of right foot- improved. Decreased sensation bilateral feet   Assessment/Plan: 1. Functional deficits which require 3+ hours per day of interdisciplinary therapy in a comprehensive inpatient rehab setting. Physiatrist is providing close team supervision and 24 hour management of active medical problems listed below. Physiatrist and rehab team continue to assess barriers to  discharge/monitor patient progress toward functional and medical goals  Care Tool:  Bathing    Body parts bathed by patient: Face (UB and LB Bathing per OT Note completed with Supervision)         Bathing assist Assist Level: Supervision/Verbal cueing     Upper Body Dressing/Undressing Upper body dressing   What is the patient wearing?: Hospital gown only    Upper body assist Assist Level: Independent    Lower Body Dressing/Undressing Lower body dressing      What is the patient wearing?: Pants, Underwear/pull up     Lower body assist Assist for lower body dressing: Supervision/Verbal cueing (wtih reacher)     Toileting Toileting Toileting Activity did not occur (Clothing management and hygiene only): N/A (no void or bm)  Toileting assist Assist for toileting: Contact Guard/Touching assist     Transfers Chair/bed transfer  Transfers assist  Chair/bed transfer activity did not occur: Refused (pt sleeps in chair)  Chair/bed transfer assist level: Supervision/Verbal cueing     Locomotion Ambulation   Ambulation assist      Assist level: Supervision/Verbal cueing Assistive device: Cane-straight Max distance: 50ft   Walk 10 feet activity   Assist     Assist level: Supervision/Verbal cueing Assistive device: Cane-straight   Walk 50 feet activity  Assist Walk 50 feet with 2 turns activity did not occur: Safety/medical concerns (fatigue, R foot pain, weakness)  Assist level: Supervision/Verbal cueing Assistive device: Cane-straight    Walk 150 feet activity   Assist Walk 150 feet activity did not occur: Safety/medical concerns (fatigue, R foot pain, weakness)  Assist level: Supervision/Verbal cueing Assistive device: Walker-rolling    Walk 10 feet on uneven surface  activity   Assist Walk 10 feet on uneven surfaces activity did not occur: Safety/medical concerns (fatigue, R foot pain, weakness)         Wheelchair     Assist  Is the patient using a wheelchair?: Yes Type of Wheelchair: Manual    Wheelchair assist level: Supervision/Verbal cueing Max wheelchair distance: 38ft    Wheelchair 50 feet with 2 turns activity    Assist        Assist Level: Moderate Assistance - Patient 50 - 74% (per PT documentation)   Wheelchair 150 feet activity     Assist      Assist Level: Total Assistance - Patient < 25% (Per PT documentation)   Blood pressure (!) 141/58, pulse 77, temperature (!) 97.5 F (36.4 C), temperature source Oral, resp. rate 20, height 5\' 4"  (1.626 m), weight (!) 166.5 kg, last menstrual period 07/17/2012, SpO2 100 %.    Medical Problem List and Plan: 1. Functional deficits/debility secondary to acute on chronic renal failure superimposed with diastolic congestive heart failure             -patient may shower             -ELOS/Goals: 10-14 days modI             -Continue CIR  HFU scheduled 06/28/21 2.  Antithrombotics: -DVT/anticoagulation:  Pharmaceutical: Other (comment) Eliquis             -antiplatelet therapy: N/A 3. Right foot pain: Resolved. Tylenol as needed. D/c Dilaudid, Add gabapentin HS, continue. Discussed that her pain and mobility have improved.  4. Insomnia: continue Gabapentin HS. Melatonin as needed. Increase prn trazodone to 50mg .              -antipsychotic agents: N/A 5. Neuropsych: This patient is capable of making decisions on her own behalf. 6. Skin/Wound Care: Routine skin checks 7. Fluids/Electrolytes/Nutrition: Routine in and outs with follow-up chemistries 8.  End-stage renal disease.  Permacath placed 02/25/2021 hemodialysis initiated. Discussed recent creatinine level with her, creatinine uptrending, continue to monitor with HD labs.  9.  History of hematuria.  Left ureteral stone with recent uteroscopy/laser lithotripsy.  Follow-up urology Dr Diamantina [place] 10.  Hypothyroidism.  continue Synthroid 11.  Diabetes mellitus.  Hemoglobin A1c 7.2.  SSI. 12.   Hypothyroidism.  Continue Synthroid 13.  Hypertension.  Provided with list of foods that can help to lower blood pressure. Continue Clonidine 0.2 mg twice daily, Norvasc 5 mg twice daily.  Monitor with increased mobility 14.  Hyperlipidemia.  Lipitor 15.  Atrial fibrillation/history of pulmonary emboli. Continue Eliquis.  Lopressor 100 mg twice daily 16.  Acute on chronic anemia.  Transfuse 1 unit RBCs 03/04/2021.  Follow-up CBC 17.  Obesity.  BMI 62.17.  Dietary follow-up 18.  Chronic hypoxemic respiratory failure.  Continue oxygen therapy as directed as well as CPAP 19. Constipation: type 2 stool 1/21.  20. Dry skin: order Eucerin cream.    LOS: 6 days A FACE TO FACE EVALUATION WAS PERFORMED  Nicole Schmidt 03/10/2021, 6:46 PM

## 2021-03-10 NOTE — Progress Notes (Signed)
[facility] Progress Note  Subjective:  Seen in room. Working with therapy. For dialysis today.   Vitals:   03/09/21 2013 03/09/21 2026 03/10/21 0500 03/10/21 0510  BP:  (!) 149/89  (!) 131/47  Pulse: 77 62  60  Resp: 18   17  Temp: 98.5 F (36.9 C)   97.7 F (36.5 C)  TempSrc:      SpO2: 97%   98%  Weight:   (!) 166.5 kg   Height:        Exam:   alert, nad , obese  no jvd  Chest cta bilat  Cor reg no RG  Abd soft ntnd no ascites   Ext diffuse 2+ bilat LE edema   Alert, NF, ox3   RIJ TDC in place  Accepted to [facility] TTS    Assessment/ Plan: Acute Kidney Injury on chronic kidney disease stage IV: baseline creatinine of 2.43 in Nov 2022. Dialysis initiated on 02/25/21. Arranged for outpatient HD at [facility] on TTS schedule. Transferred from [facility] to CIR at [facility] for rehab.  No evidence of renal recovery. Continue HD on TTS schedule here. Next HD 1/26.  Hypertension w/ chronic kidney disease: On amlodipine, clonidine and metoprolol here. BP stable. Sig volume overload w/ diffuse LE edema. High UF goals w/ HD. Get standing weights.  Anemia of chronic kidney disease: Hgb 7.5. s/p prbcs. Tsat 11 Ferritin 273 -Will replete. Ferrlicit 696E IV x4.  Aranesp 25 q week started 1/23.  Hematuria: Ureteral stent placed by urology on 02/14/2021.  With recent episode of nephrolithiasis.  No further reports of hematuria reports Secondary Hyperparathyroidism: Ca acceptable. On calcitriol. Phos 8.4 --Will start Renvela binder  Acute respiratory failure: increased O2 needed initially from her baseline of 2 liters. Weaned to 2L now. Still vol overload.    Lynnda Child PA-C [facility] 03/10/2021,12:29 PM  Recent Labs  Lab 03/05/21 1017 03/08/21 0545  K 3.9 4.1  BUN 70* 71*  CREATININE 6.04* 6.61*  CALCIUM 8.4* 8.2*  PHOS  --  8.4*  HGB 7.5* 7.5*    Inpatient medications:  amLODipine  5 mg Oral BID   apixaban  5 mg Oral BID    atorvastatin  20 mg Oral QHS   calcitRIOL  0.25 mcg Oral Daily   calcium carbonate  500 mg of elemental calcium Oral Q breakfast   Chlorhexidine Gluconate Cloth  6 each Topical Daily   cloNIDine  0.2 mg Oral BID   [START ON 03/15/2021] darbepoetin (ARANESP) injection - DIALYSIS  25 mcg Intravenous Q Tue-HD   folic acid  9,528 mcg Oral Daily   gabapentin  300 mg Oral QHS   hydrocerin   Topical BID   insulin aspart  0-9 Units Subcutaneous TID AC & HS   levothyroxine  112 mcg Oral QAC breakfast   metoprolol tartrate  100 mg Oral BID   sevelamer carbonate  800 mg Oral TID WC   [START ON 03/11/2021] Vitamin D (Ergocalciferol)  50,000 Units Oral Q Fri    ferric gluconate (FERRLECIT) IVPB 250 mg (03/09/21 2050)   acetaminophen **OR** acetaminophen, albuterol, bisacodyl, melatonin, ondansetron **OR** ondansetron (ZOFRAN) IV, traZODone

## 2021-03-10 NOTE — Progress Notes (Signed)
Pt has home unit. Will call if she needs any assistance.

## 2021-03-11 DIAGNOSIS — R5381 Other malaise: Secondary | ICD-10-CM | POA: Diagnosis not present

## 2021-03-11 LAB — GLUCOSE, CAPILLARY
Glucose-Capillary: 99 mg/dL (ref 70–99)
Glucose-Capillary: 152 mg/dL — ABNORMAL HIGH (ref 70–99)
Glucose-Capillary: 98 mg/dL (ref 70–99)
Glucose-Capillary: 143 mg/dL — ABNORMAL HIGH (ref 70–99)

## 2021-03-11 NOTE — Progress Notes (Signed)
PROGRESS NOTE   Subjective/Complaints: No new complaints today Still with minimal urination and she is worried about this- appreciate nephrology discussing with her more today.  Slept well yesterday during dialysis  ROS: foot pain improved, sleep improved, mobility improving, breathing improving, decreased urination  Objective:   No results found. Recent Labs    03/10/21 1949  WBC 11.9*  HGB 8.0*  HCT 26.6*  PLT 228   Recent Labs    03/10/21 1949  NA 130*  K 3.6  CL 95*  CO2 26  GLUCOSE 197*  BUN 29*  CREATININE 3.49*  CALCIUM 7.9*    Intake/Output Summary (Last 24 hours) at 03/11/2021 1333 Last data filed at 03/11/2021 1254 Gross per 24 hour  Intake 1038 ml  Output 3700 ml  Net -2662 ml        Physical Exam: Vital Signs Blood pressure (!) 129/57, pulse 71, temperature 98.2 F (36.8 C), resp. rate 20, height 5\' 4"  (1.626 m), weight (!) 165.6 kg, last menstrual period 07/17/2012, SpO2 98 %. Gen: no distress, normal appearing HEENT: oral mucosa pink and moist, nasal cannula in place Cardio: Irregular Chest: normal effort, normal rate of breathing, diminished breath sounds bilaterally, O2 sat down to 84 with activity, recovers quickly, normal sat at baseline Abd: soft, non-distended Ext: 2+ bilateral lower extremity edema Psych: pleasant, normal affect Skin: RIJ permacath C/D/I, dry and scaly skin on bilateral feet. Neurological:     Comments: Patient is alert.  No acute distress.  Mood is a bit flat but appropriate.  Follows commands.  Oriented x3. Allodynia over lateral aspect of right foot- improved. Decreased sensation bilateral feet   Assessment/Plan: 1. Functional deficits which require 3+ hours per day of interdisciplinary therapy in a comprehensive inpatient rehab setting. Physiatrist is providing close team supervision and 24 hour management of active medical problems listed below. Physiatrist  and rehab team continue to assess barriers to discharge/monitor patient progress toward functional and medical goals  Care Tool:  Bathing    Body parts bathed by patient: Right arm, Left arm, Chest, Abdomen, Right upper leg, Left upper leg, Right lower leg, Left lower leg, Face         Bathing assist Assist Level: Supervision/Verbal cueing     Upper Body Dressing/Undressing Upper body dressing   What is the patient wearing?: Hospital gown only    Upper body assist Assist Level: Independent    Lower Body Dressing/Undressing Lower body dressing      What is the patient wearing?: Pants, Underwear/pull up     Lower body assist Assist for lower body dressing: Supervision/Verbal cueing Assistive Device Comment: with reacher   Toileting Toileting Toileting Activity did not occur (Clothing management and hygiene only): N/A (no void or bm)  Toileting assist Assist for toileting: Contact Guard/Touching assist     Transfers Chair/bed transfer  Transfers assist  Chair/bed transfer activity did not occur: Refused (pt sleeps in chair)  Chair/bed transfer assist level: Supervision/Verbal cueing     Locomotion Ambulation   Ambulation assist      Assist level: Supervision/Verbal cueing Assistive device: Cane-straight Max distance: 83ft   Walk 10 feet activity   Assist  Assist level: Supervision/Verbal cueing Assistive device: Cane-straight   Walk 50 feet activity   Assist Walk 50 feet with 2 turns activity did not occur: Safety/medical concerns (fatigue, R foot pain, weakness)  Assist level: Supervision/Verbal cueing Assistive device: Cane-straight    Walk 150 feet activity   Assist Walk 150 feet activity did not occur: Safety/medical concerns (fatigue, R foot pain, weakness)  Assist level: Supervision/Verbal cueing Assistive device: Walker-rolling    Walk 10 feet on uneven surface  activity   Assist Walk 10 feet on uneven surfaces activity  did not occur: Safety/medical concerns (fatigue, R foot pain, weakness)         Wheelchair     Assist Is the patient using a wheelchair?: Yes Type of Wheelchair: Manual    Wheelchair assist level: Supervision/Verbal cueing Max wheelchair distance: 30ft    Wheelchair 50 feet with 2 turns activity    Assist        Assist Level: Moderate Assistance - Patient 50 - 74% (per PT documentation)   Wheelchair 150 feet activity     Assist      Assist Level: Total Assistance - Patient < 25% (Per PT documentation)   Blood pressure (!) 129/57, pulse 71, temperature 98.2 F (36.8 C), resp. rate 20, height 5\' 4"  (1.626 m), weight (!) 165.6 kg, last menstrual period 07/17/2012, SpO2 98 %.    Medical Problem List and Plan: 1. Functional deficits/debility secondary to acute on chronic renal failure superimposed with diastolic congestive heart failure             -patient may shower             -ELOS/Goals: 10-14 days modI             Continue CIR  HFU scheduled  2.  Antithrombotics: -DVT/anticoagulation:  Pharmaceutical: Other (comment) Eliquis             -antiplatelet therapy: N/A 3. Right foot pain: Resolved. Tylenol as needed. D/c Dilaudid, Add gabapentin HS, continue. Discussed that her pain and mobility have improved.  4. Insomnia: continue Gabapentin HS. Melatonin as needed. Increase prn trazodone to 50mg .              -antipsychotic agents: N/A 5. Neuropsych: This patient is capable of making decisions on her own behalf. 6. Skin/Wound Care: Routine skin checks 7. Fluids/Electrolytes/Nutrition: Routine in and outs with follow-up chemistries 8.  End-stage renal disease.  Permacath placed 02/25/2021 hemodialysis initiated. Discussed recent creatinine level with her, creatinine uptrending, continue to monitor with HD labs.  9.  History of hematuria.  Left ureteral stone with recent uteroscopy/laser lithotripsy.  Follow-up urology Dr Diamantina [place] 10.  Hypothyroidism.  continue  Synthroid 11.  Diabetes mellitus.  Hemoglobin A1c 7.2.  SSI. 12.  Hypothyroidism.  Continue Synthroid 13.  Hypertension.  Provided with list of foods that can help to lower blood pressure. Continue Clonidine 0.2 mg twice daily, Norvasc 5 mg twice daily.  Monitor with increased mobility 14.  Hyperlipidemia.  Lipitor 15.  Atrial fibrillation/history of pulmonary emboli. Continue Eliquis.  Lopressor 100 mg twice daily 16.  Acute on chronic anemia.  Transfuse 1 unit RBCs 03/04/2021.  Follow-up CBC 17.  Obesity.  BMI 62.17.  Dietary follow-up 18.  Chronic hypoxemic respiratory failure.  Continue oxygen therapy as directed as well as CPAP. Weaned to 2L O2 19. Constipation: continue prn dulcolax.  20. Dry skin: order Eucerin cream.  21. Decreased urination: discussed likely due to fluid restriction/dialysis.  22. Bilateral lower  extremity edema: continue fluid restriction, dialysis.    LOS: 7 days A FACE TO FACE EVALUATION WAS PERFORMED  Martha Clan P Storey Stangeland 03/11/2021, 1:33 PM

## 2021-03-11 NOTE — Progress Notes (Signed)
Physical Therapy Session Note  Patient Details  Name: Nicole Schmidt MRN: [id number] Date of Birth: [birth year]  Today's Date: 03/11/2021 PT Individual Time: 8527-7824 PT Individual Time Calculation (min): 54 min   Short Term Goals: Week 1:  PT Short Term Goal 1 (Week 1): STG=LTG due to LOS  Skilled Therapeutic Interventions/Progress Updates:   Received pt sitting in recliner, pt agreeable to PT treatment, and denied any pain during session. Session with emphasis on functional mobility, generalized strengthening and endurance, toileting, and stair navigation. Pt performed all transfers with [facility] and supervision throughout session. Pt transported to/from room in [facility] dependently for time management purposes. Pt navigated 4 steps x 2 trials with 2 rails and CGA/close supervision ascending and descending with a step to pattern with standing rest break at top. O2 sat 88% increasing to 94% on 2L with 1 minute seated rest break. Pt then performed seated BLE strengthening on Kinet[place] at 30 cm/sec for 1 minute x 4 trials with BUE support with emphasis on glute/quad strengthening - decreased ROM due to body habitus. Pt required multiple extended rest breaks throughout session due to SOB/fatigue. Returned to room and pt reported urge to use restroom. Pt ambulated in/out of bathroom with SPC and supervision and able to perform all toileting tasks with supervision. Concluded session with pt sitting in recliner with all needs within reach.   Therapy Documentation Precautions:  Precautions Precautions: Fall Precaution Comments: 3L O2 Restrictions Weight Bearing Restrictions: No RLE Weight Bearing: Weight bearing as tolerated LLE Weight Bearing: Weight bearing as tolerated  Therapy/Group: Individual Therapy Alfonse Alpers PT, DPT   03/11/2021, 7:17 AM

## 2021-03-11 NOTE — Progress Notes (Signed)
Occupational Therapy Session Note  Patient Details  Name: Nicole Schmidt MRN: [id number] Date of Birth: Mar 08, 1965  Today's Date: 03/11/2021 OT Individual Time: 1430-1530 OT Individual Time Calculation (min): 60 min    Short Term Goals: Week 1:  OT Short Term Goal 1 (Week 1): STG = LTG 2/2 ELOS  Skilled Therapeutic Interventions/Progress Updates:  Pt greeted in recliner  agreeable to OT intervention. Session focus on IADL tasks, functional mobility, dynamic standing balance and decreasing overall caregiver burden. Pt completed functional mobility from recliner> w/c with SPC and CGA. Pt transported to [place] with total A for time mgmt. Pt completed dynamic balance task to simulate IADLs where pt instructed to ambulate around room to collect wash cloths from various surface heights to simulate cleaning her house as she lives alone. Pt completed task with SPC and CGA. Pt able to reach [facility] for wash cloths and to mid thigh level for items with no LOB during task. Talked through home mgmt tasks with an emphasis on energy conservation. Suggested pt use reusable bag to transport laundry items instead of laundry basket to decrease risk for fall and to save energy. Pt able to stand to fold wash cloths with no UB support and no LOB, even though pt reports she will likely sit to fold washcloths. Pt able to ambulate over to next gym to put wash cloths away in closet to simulate putting away laundry at home. Pt worked on increasing overall LB strength and endurance with pt completing 2 round of sit<>stands for 1 min each.     First round: 9 stands in 30 secs  Second round: 10 stands in 30 secs  Pt request to be transported back to room d/t fatigue. Pt did complete functional mobility into bathroom with SPC and supervision, supervision for 3/3 toileting tasks. Pt left seated in recliner with all needs within reach.                           >96% on 2L [place] throughout session    Therapy Documentation Precautions:   Precautions Precautions: Fall Precaution Comments: 2L O2 Restrictions Weight Bearing Restrictions: No RLE Weight Bearing: Weight bearing as tolerated LLE Weight Bearing: Weight bearing as tolerated   Pain: no pain reported during session     Therapy/Group: Individual Therapy  Precious Haws 03/11/2021, 3:45 PM

## 2021-03-11 NOTE — Progress Notes (Signed)
[facility] Progress Note   Subjective: Up in chair eating lunch. No C/Os. Still wearing O2. Concerned UOP has decreased. Explained that this can be expected after starting dialysis. This tends to vary from individual to individual.      Objective Vitals:   03/10/21 2250 03/11/21 0514 03/11/21 0743 03/11/21 0843  BP: (!) 132/50 (!) 113/51  (!) 129/57  Pulse: 70 66  71  Resp:  20    Temp:  98.2 F (36.8 C)    TempSrc:      SpO2:  98%    Weight:   (!) 165.6 kg   Height:       Physical Exam General: Pleasant obese female in NAD Heart: HS distant D/T body habitus. S1,S2 RRR No M/R/G Lungs:CTAB. No WOB.  Abdomen: obese, NABS, NT, ND Extremities: 2+ BLE edema 1+ pedal edema bilaterally Dialysis Access: RIJ TDC drsg CDI.    Additional Objective Labs: Basic Metabolic Panel: Recent Labs  Lab 03/05/21 1017 03/08/21 0545 03/10/21 1949  NA 136 130* 130*  K 3.9 4.1 3.6  CL 98 96* 95*  CO2 24 23 26   GLUCOSE 141* 111* 197*  BUN 70* 71* 29*  CREATININE 6.04* 6.61* 3.49*  CALCIUM 8.4* 8.2* 7.9*  PHOS  --  8.4* 4.0   Liver Function Tests: Recent Labs  Lab 03/05/21 1017 03/08/21 0545 03/10/21 1949  AST 36  --   --   ALT 41  --   --   ALKPHOS 88  --   --   BILITOT 0.4  --   --   PROT 6.3*  --   --   ALBUMIN 2.4* 2.4* 2.5*   No results for input(s): LIPASE, AMYLASE in the last 168 hours. CBC: Recent Labs  Lab 03/05/21 1017 03/08/21 0545 03/10/21 1949  WBC 12.1* 11.0* 11.9*  NEUTROABS 9.2*  --   --   HGB 7.5* 7.5* 8.0*  HCT 25.5* 24.9* 26.6*  MCV 93.1 92.2 90.8  PLT 250 214 228   Blood Culture    Component Value Date/Time   SDES WOUND 09/30/2019 1504   SPECREQUEST Groin, Right 09/30/2019 1504   CULT ABUNDANT MORGANELLA MORGANII 09/30/2019 1504   REPTSTATUS 10/03/2019 FINAL 09/30/2019 1504    Cardiac Enzymes: No results for input(s): CKTOTAL, CKMB, CKMBINDEX, TROPONINI in the last 168 hours. CBG: Recent Labs  Lab 03/10/21 0611  03/10/21 1154 03/10/21 2045 03/11/21 0632 03/11/21 1159  GLUCAP 126* 102* 178* 99 152*   Iron Studies: No results for input(s): IRON, TIBC, TRANSFERRIN, FERRITIN in the last 72 hours. @lablastinr3 @ Studies/Results: No results found. Medications:   amLODipine  5 mg Oral BID   apixaban  5 mg Oral BID   atorvastatin  20 mg Oral QHS   calcitRIOL  0.25 mcg Oral Daily   calcium carbonate  500 mg of elemental calcium Oral Q breakfast   Chlorhexidine Gluconate Cloth  6 each Topical Daily   cloNIDine  0.2 mg Oral BID   [START ON 03/15/2021] darbepoetin (ARANESP) injection - DIALYSIS  25 mcg Intravenous Q Tue-HD   folic acid  3,785 mcg Oral Daily   gabapentin  300 mg Oral QHS   hydrocerin   Topical BID   insulin aspart  0-9 Units Subcutaneous TID AC & HS   levothyroxine  112 mcg Oral QAC breakfast   metoprolol tartrate  100 mg Oral BID   sevelamer carbonate  800 mg Oral TID WC   Vitamin D (Ergocalciferol)  50,000 Units Oral Q Fri  Accepted to [facility] TTS      Assessment/ Plan: Acute Kidney Injury on chronic kidney disease stage IV: baseline creatinine of 2.43 in Nov 2022. Dialysis initiated on 02/25/21. Arranged for outpatient HD at [facility] on TTS schedule. Transferred from [facility] to CIR at [facility] for rehab.  No evidence of renal recovery. Continue HD on TTS schedule here. Next HD 1/28.  Hypertension w/ chronic kidney disease: On amlodipine, clonidine and metoprolol here. BP stable. Sig volume overload w/ diffuse LE edema. High UF goals w/ HD. Get standing weights.  Anemia of chronic kidney disease: Hgb 8.0. s/p prbcs. Tsat 11 Ferritin 273 -Will replete. Ferrlicit 710G IV x4.  Aranesp 25 q week started 1/23.  Hematuria: Ureteral stent placed by urology on 02/14/2021.  With recent episode of nephrolithiasis.  No further reports of hematuria reports Secondary Hyperparathyroidism: Ca acceptable. On calcitriol. Phos 8.4 --Will start Renvela binder  Acute respiratory failure:  increased O2 needed initially from her baseline of 2 liters. Weaned to 2L now. Still vol overload.    Dilia Alemany H. Raiven Belizaire NP-C 03/11/2021, 12:14 PM  [employer] [phone]

## 2021-03-11 NOTE — Progress Notes (Signed)
Occupational Therapy Session Note  Patient Details  Name: Nicole Schmidt MRN: [id number] Date of Birth: 1965/02/17  Today's Date: 03/11/2021 OT Individual Time: 1003-1058 OT Individual Time Calculation (min): 55 min    Short Term Goals: Week 1:  OT Short Term Goal 1 (Week 1): STG = LTG 2/2 ELOS  Skilled Therapeutic Interventions/Progress Updates:  Skilled OT intervention completed with focus on independence and endurance with self-care, AE education. Pt received seated in recliner, asleep but agreeable to session. Pt with [place] on, 2L O2, 91% sat and remained on 2L throughout session. Pt very slow moving today with increased time to increase pt's arousal level and for task completion however pt more alert by end of session. Due to HD port, pt completed sponge bathing while seated in recliner, with mod I for UB, mod I for LB with long handled sponge education provided and technique used by pt (issued pt one for home), and supervision for the sit > stand level for pericare. Pt donning shirt with mod I, however needing cues to thread [place] and redonn [place] vs leaving off during ADLs as pt's SPO2 dropped to 83% sat after just 2 mins on RA. Pt using reacher for donning of LB clothing, with good technique demonstrated, with cues still needed for efficiency. Pt required breathing rest break due to fatigue after LB dressing. Grooming completed seated with independence. While seated in recliner, pt encouraged to use HEP handout to attempt setting up each exercise to ensure good form and independence for exercises once home. Pt able to teach back setting up form for the following with pt completing 10 reps of each with orange theraband: shoulder horizontal abduction, bicep flexion, shoulder flexion.  Pt with good technique, however cues still required for form and therapist addressing pt's questions. Pt asking if she could use blue theraband that she has at home, with this therapist advising against due to level of  resistance being a big jump from the orange level she has used during therapy.  Pt left seated in recliner, with chair alarm on and all needs in reach at end of session.   Therapy Documentation Precautions:  Precautions Precautions: Fall Precaution Comments: 3L O2 Restrictions Weight Bearing Restrictions: No RLE Weight Bearing: Weight bearing as tolerated LLE Weight Bearing: Weight bearing as tolerated  Pain: No c/o pain   Therapy/Group: Individual Therapy  Leola Fiore E [employer] 03/11/2021, 7:31 AM

## 2021-03-12 DIAGNOSIS — R5381 Other malaise: Secondary | ICD-10-CM | POA: Diagnosis not present

## 2021-03-12 LAB — CBC
HCT: 25.9 % — ABNORMAL LOW (ref 36.0–46.0)
Hemoglobin: 7.7 g/dL — ABNORMAL LOW (ref 12.0–15.0)
MCH: 28.1 pg (ref 26.0–34.0)
MCHC: 29.7 g/dL — ABNORMAL LOW (ref 30.0–36.0)
MCV: 94.5 fL (ref 80.0–100.0)
Platelets: 237 10*3/uL (ref 150–400)
RBC: 2.74 MIL/uL — ABNORMAL LOW (ref 3.87–5.11)
RDW: 15.7 % — ABNORMAL HIGH (ref 11.5–15.5)
WBC: 12.5 10*3/uL — ABNORMAL HIGH (ref 4.0–10.5)
nRBC: 0.3 % — ABNORMAL HIGH (ref 0.0–0.2)

## 2021-03-12 LAB — GLUCOSE, CAPILLARY
Glucose-Capillary: 96 mg/dL (ref 70–99)
Glucose-Capillary: 114 mg/dL — ABNORMAL HIGH (ref 70–99)
Glucose-Capillary: 113 mg/dL — ABNORMAL HIGH (ref 70–99)
Glucose-Capillary: 188 mg/dL — ABNORMAL HIGH (ref 70–99)

## 2021-03-12 LAB — RENAL FUNCTION PANEL
Albumin: 2.5 g/dL — ABNORMAL LOW (ref 3.5–5.0)
Anion gap: 10 (ref 5–15)
BUN: 54 mg/dL — ABNORMAL HIGH (ref 6–20)
CO2: 27 mmol/L (ref 22–32)
Calcium: 8.8 mg/dL — ABNORMAL LOW (ref 8.9–10.3)
Chloride: 93 mmol/L — ABNORMAL LOW (ref 98–111)
Creatinine, Ser: 5.58 mg/dL — ABNORMAL HIGH (ref 0.44–1.00)
GFR, Estimated: 8 mL/min — ABNORMAL LOW (ref >60)
Glucose, Bld: 174 mg/dL — ABNORMAL HIGH (ref 70–99)
Phosphorus: 7.2 mg/dL — ABNORMAL HIGH (ref 2.5–4.6)
Potassium: 3.9 mmol/L (ref 3.5–5.1)
Sodium: 130 mmol/L — ABNORMAL LOW (ref 135–145)

## 2021-03-12 MED ORDER — HEPARIN SODIUM (PORCINE) 1000 UNIT/ML IJ SOLN
INTRAMUSCULAR | Status: AC
  Administered 2021-03-12: 3000 [IU]
  Filled 2021-03-12: qty 3

## 2021-03-12 NOTE — Procedures (Signed)
° °  I was present at this dialysis session, have reviewed the session itself and made  appropriate changes Kelly Splinter MD [place] pager [phone]   03/12/2021, 3:34 PM

## 2021-03-12 NOTE — Progress Notes (Signed)
PROGRESS NOTE   Subjective/Complaints: No new issues. Denies pain. No sob.   ROS: Patient denies fever, rash, sore throat, blurred vision, dizziness, nausea, vomiting, diarrhea, cough, shortness of breath or chest pain, joint or back/neck pain, headache, or mood change.   Objective:   No results found. Recent Labs    03/10/21 1949  WBC 11.9*  HGB 8.0*  HCT 26.6*  PLT 228   Recent Labs    03/10/21 1949  NA 130*  K 3.6  CL 95*  CO2 26  GLUCOSE 197*  BUN 29*  CREATININE 3.49*  CALCIUM 7.9*    Intake/Output Summary (Last 24 hours) at 03/12/2021 1401 Last data filed at 03/12/2021 1242 Gross per 24 hour  Intake 711 ml  Output 150 ml  Net 561 ml        Physical Exam: Vital Signs Blood pressure (!) 119/54, pulse (!) 50, temperature 98.3 F (36.8 C), temperature source Oral, resp. rate 17, height 5\' 4"  (1.626 m), weight (!) 166.7 kg, last menstrual period 07/17/2012, SpO2 96 %. Constitutional: No distress . Vital signs reviewed. HEENT: NCAT, EOMI, oral membranes moist, O2 2L Neck: supple Cardiovascular: IRR without murmur. No JVD    Respiratory/Chest: CTA Bilaterally without wheezes or rales. Normal effort    GI/Abdomen: BS +, non-tender, non-distended Ext: no clubbing, cyanosis, or edema Psych: pleasant and cooperative  Skin: RIJ permacath C/D/I, dry and scaly skin on bilateral feet. Neurological:     Comments: Patient is alert.  No acute distress.  Mood is a bit flat but appropriate.  Follows commands.  Oriented x3. Allodynia over lateral aspect of right foot- improved. Decreased sensation bilateral feet is still present   Assessment/Plan: 1. Functional deficits which require 3+ hours per day of interdisciplinary therapy in a comprehensive inpatient rehab setting. Physiatrist is providing close team supervision and 24 hour management of active medical problems listed below. Physiatrist and rehab team  continue to assess barriers to discharge/monitor patient progress toward functional and medical goals  Care Tool:  Bathing    Body parts bathed by patient: Right arm, Left arm, Chest, Abdomen, Right upper leg, Left upper leg, Right lower leg, Left lower leg, Face         Bathing assist Assist Level: Supervision/Verbal cueing     Upper Body Dressing/Undressing Upper body dressing   What is the patient wearing?: Hospital gown only    Upper body assist Assist Level: Independent    Lower Body Dressing/Undressing Lower body dressing      What is the patient wearing?: Pants, Underwear/pull up     Lower body assist Assist for lower body dressing: Supervision/Verbal cueing Assistive Device Comment: with reacher   Toileting Toileting Toileting Activity did not occur (Clothing management and hygiene only): N/A (no void or bm)  Toileting assist Assist for toileting: Contact Guard/Touching assist     Transfers Chair/bed transfer  Transfers assist  Chair/bed transfer activity did not occur: Refused (pt sleeps in chair)  Chair/bed transfer assist level: Supervision/Verbal cueing     Locomotion Ambulation   Ambulation assist      Assist level: Supervision/Verbal cueing Assistive device: Cane-straight Max distance: 32ft   Walk 10 feet  activity   Assist     Assist level: Supervision/Verbal cueing Assistive device: Cane-straight   Walk 50 feet activity   Assist Walk 50 feet with 2 turns activity did not occur: Safety/medical concerns (fatigue, R foot pain, weakness)  Assist level: Supervision/Verbal cueing Assistive device: Cane-straight    Walk 150 feet activity   Assist Walk 150 feet activity did not occur: Safety/medical concerns (fatigue, R foot pain, weakness)  Assist level: Supervision/Verbal cueing Assistive device: Walker-rolling    Walk 10 feet on uneven surface  activity   Assist Walk 10 feet on uneven surfaces activity did not occur:  Safety/medical concerns (fatigue, R foot pain, weakness)         Wheelchair     Assist Is the patient using a wheelchair?: Yes Type of Wheelchair: Manual    Wheelchair assist level: Supervision/Verbal cueing Max wheelchair distance: 48ft    Wheelchair 50 feet with 2 turns activity    Assist        Assist Level: Moderate Assistance - Patient 50 - 74% (per PT documentation)   Wheelchair 150 feet activity     Assist      Assist Level: Total Assistance - Patient < 25% (Per PT documentation)   Blood pressure (!) 119/54, pulse (!) 50, temperature 98.3 F (36.8 C), temperature source Oral, resp. rate 17, height 5\' 4"  (1.626 m), weight (!) 166.7 kg, last menstrual period 07/17/2012, SpO2 96 %.    Medical Problem List and Plan: 1. Functional deficits/debility secondary to acute on chronic renal failure superimposed with diastolic congestive heart failure             -patient may shower             -ELOS/Goals: 10-14 days modI             -Continue CIR therapies including PT, OT   HFU scheduled  2.  Antithrombotics: -DVT/anticoagulation:  Pharmaceutical: Other (comment) Eliquis             -antiplatelet therapy: N/A 3. Right foot pain: Resolved. Tylenol as needed. D/c'ed Dilaudid,  -Added gabapentin HS, continue. Discussed that her pain and mobility have improved.  4. Insomnia: continue Gabapentin HS. Melatonin as needed. Increase prn trazodone to 50mg .              -antipsychotic agents: N/A 5. Neuropsych: This patient is capable of making decisions on her own behalf. 6. Skin/Wound Care: Routine skin checks 7. Fluids/Electrolytes/Nutrition: Routine in and outs with follow-up chemistries 8.  End-stage renal disease.  Permacath placed 02/25/2021 hemodialysis initiated. Discussed recent creatinine level with her, creatinine uptrending, continue to monitor with HD labs.  9.  History of hematuria.  Left ureteral stone with recent uteroscopy/laser lithotripsy.  Follow-up  urology Dr Diamantina [place] 10.  Hypothyroidism.  continue Synthroid 11.  Diabetes mellitus.  Hemoglobin A1c 7.2.  SSI. 12.  Hypothyroidism.  Continue Synthroid 13.  Hypertension.  Provided with list of foods that can help to lower blood pressure. Continue Clonidine 0.2 mg twice daily, Norvasc 5 mg twice daily.     1/28 bp well controlled 14.  Hyperlipidemia.  Lipitor 15.  Atrial fibrillation/history of pulmonary emboli. Continue Eliquis.  Lopressor 100 mg twice daily 16.  Acute on chronic anemia.  Transfuse 1 unit RBCs 03/04/2021.  Follow-up CBC 17.  Obesity.  BMI 62.17.  Dietary follow-up 18.  Chronic hypoxemic respiratory failure.  Continue oxygen therapy as directed as well as CPAP. Weaned to 2L O2 19. Constipation: continue prn dulcolax.  20. Dry skin: order Eucerin cream.  21. Decreased urination: discussed likely due to fluid restriction/dialysis.  22. Bilateral lower extremity edema: continue fluid restriction, dialysis.   -elevate legs when possible   LOS: 8 days A FACE TO FACE EVALUATION WAS PERFORMED  Meredith Staggers 03/12/2021, 2:01 PM

## 2021-03-12 NOTE — Progress Notes (Signed)
Pt. Has home cpap unit. Pt. Operates the unit herself.

## 2021-03-13 LAB — GLUCOSE, CAPILLARY
Glucose-Capillary: 125 mg/dL — ABNORMAL HIGH (ref 70–99)
Glucose-Capillary: 111 mg/dL — ABNORMAL HIGH (ref 70–99)
Glucose-Capillary: 127 mg/dL — ABNORMAL HIGH (ref 70–99)
Glucose-Capillary: 177 mg/dL — ABNORMAL HIGH (ref 70–99)

## 2021-03-13 MED ORDER — DARBEPOETIN ALFA 60 MCG/0.3ML IJ SOSY
60.0000 ug | PREFILLED_SYRINGE | INTRAMUSCULAR | Status: DC
  Filled 2021-03-13: qty 0.3

## 2021-03-13 NOTE — Progress Notes (Signed)
Pt has home CPAP, no assistance needed.  

## 2021-03-13 NOTE — Progress Notes (Signed)
Assisted pt. With placing on her home cpap. Pt. BBS are diminished. Pt. Is no distress and has no complaints.

## 2021-03-13 NOTE — Progress Notes (Signed)
Occupational Therapy Session Note  Patient Details  Name: Nicole Schmidt MRN: [id number] Date of Birth: 21-Jun-1965  Today's Date: 03/13/2021 OT Individual Time: 0174-9449 OT Individual Time Calculation (min): 61 min   Short Term Goals: Week 1:  OT Short Term Goal 1 (Week 1): STG = LTG 2/2 ELOS  Skilled Therapeutic Interventions/Progress Updates:    Pt greeted in the recliner with no c/o pain at rest. On 2L 02. She asked to have her hair washed. She ambulated with close supervision using SPC to a w/c parked in front of the sink. OT assisted with hair washing using the hair washing tray. She then completed bathing/dressing tasks at sit<stand level in front of the sink. Supervision for dynamic standing balance. Supervision for oral care/grooming tasks in standing. She then ambulated back to the recliner using [place]. Pt remained sitting up, left with all needs within reach. Pt throughout session discussing multiple home modifications that she has to maximize safety during routine tasks at home. Tx focus placed on sit<stands, balance, activity tolerance, and d/c planning.   Therapy Documentation Precautions:  Precautions Precautions: Fall Precaution Comments: 2L O2 Restrictions Weight Bearing Restrictions: No RLE Weight Bearing: Weight bearing as tolerated LLE Weight Bearing: Weight bearing as tolerated ADL: ADL Eating: Independent Where Assessed-Eating: Chair Grooming: Supervision/safety Where Assessed-Grooming: Standing at sink Upper Body Bathing: Independent Where Assessed-Upper Body Bathing: Sitting at sink Lower Body Bathing: Supervision/safety Where Assessed-Lower Body Bathing: Standing at sink Upper Body Dressing: Independent Where Assessed-Upper Body Dressing: Chair Lower Body Dressing: Supervision/safety Where Assessed-Lower Body Dressing: Chair Toileting: Supervision/safety Where Assessed-Toileting: Glass blower/designer: Close supervision Toilet Transfer Method:  Counselling psychologist: Energy manager: Not assessed Social research officer, government: Close supervision Social research officer, government Method: Heritage manager: Grab bars  Therapy/Group: Individual Therapy  Kiaraliz Rafuse A Mckenzey Parcell 03/13/2021, 12:52 PM

## 2021-03-13 NOTE — Progress Notes (Signed)
Physical Therapy Session Note  Patient Details  Name: Nicole Schmidt MRN: [id number] Date of Birth: 28-Apr-1965  Today's Date: 03/13/2021 PT Individual Time: 3559-7416 PT Individual Time Calculation (min): 45 min   Short Term Goals: Week 1:  PT Short Term Goal 1 (Week 1): STG=LTG due to LOS  Skilled Therapeutic Interventions/Progress Updates:   Pt presents sitting in recliner and agreeable to therapy.  Pt states fatigue from dialysis as well as not sleeping well last night.  Pt transfers sit to stand w/ supervision from recliner as well as mat table during session.  Pt amb x 30' multiple trials during session before requiring standing rest breaks against wall.  Pt amb to 5th floor gym w/ O2 on 2 LPM at 84%.  Pt performed multiple standing reaching and stacking/unstacking cones using B UEs simultaneously, w/ decreasing BOS as well as modified tandem stance, reaching forward outside of BOS.  Pt required seated rest breaks between trials, w/ O2 sats remaining > 98%.  Pt amb back to room w/ truncated distance for rest against wall.  Pt amb into BR in room, supervision for toilet transfer.  Handed pt off to NT.     Therapy Documentation Precautions:  Precautions Precautions: Fall Precaution Comments: 2L O2 Restrictions Weight Bearing Restrictions: No RLE Weight Bearing: Weight bearing as tolerated LLE Weight Bearing: Weight bearing as tolerated General:   Vital Signs:  Pain:c/o "sore bottom"        Therapy/Group: Individual Therapy  Nicole Schmidt 03/13/2021, 1:46 PM

## 2021-03-13 NOTE — Progress Notes (Signed)
[facility] Progress Note   Subjective: Sleeps in chair. No C/Os.   Objective Vitals:   03/12/21 2027 03/13/21 0351 03/13/21 0403 03/13/21 1351  BP: (!) 144/71  (!) 117/48 (!) 129/56  Pulse: 76  64 80  Resp: 14  14 18   Temp: 98 F (36.7 C)  98.5 F (36.9 C) 98 F (36.7 C)  TempSrc: Oral   Oral  SpO2: 99%  100% 97%  Weight:  (!) 167.3 kg    Height:       Physical Exam General: Pleasant obese female in NAD Heart: HS distant D/T body habitus. S1,S2 RRR No M/R/G Lungs:CTAB. No WOB.  Abdomen: obese, NABS, NT, ND Extremities: 2+ BLE edema 1+ pedal edema bilaterally Dialysis Access: RIJ TDC drsg CDI.      Additional Objective Labs: Basic Metabolic Panel: Recent Labs  Lab 03/08/21 0545 03/10/21 1949 03/12/21 1524  NA 130* 130* 130*  K 4.1 3.6 3.9  CL 96* 95* 93*  CO2 23 26 27   GLUCOSE 111* 197* 174*  BUN 71* 29* 54*  CREATININE 6.61* 3.49* 5.58*  CALCIUM 8.2* 7.9* 8.8*  PHOS 8.4* 4.0 7.2*   Liver Function Tests: Recent Labs  Lab 03/08/21 0545 03/10/21 1949 03/12/21 1524  ALBUMIN 2.4* 2.5* 2.5*   No results for input(s): LIPASE, AMYLASE in the last 168 hours. CBC: Recent Labs  Lab 03/08/21 0545 03/10/21 1949 03/12/21 1524  WBC 11.0* 11.9* 12.5*  HGB 7.5* 8.0* 7.7*  HCT 24.9* 26.6* 25.9*  MCV 92.2 90.8 94.5  PLT 214 228 237   Blood Culture    Component Value Date/Time   SDES WOUND 09/30/2019 1504   SPECREQUEST Groin, Right 09/30/2019 1504   CULT ABUNDANT MORGANELLA MORGANII 09/30/2019 1504   REPTSTATUS 10/03/2019 FINAL 09/30/2019 1504    Cardiac Enzymes: No results for input(s): CKTOTAL, CKMB, CKMBINDEX, TROPONINI in the last 168 hours. CBG: Recent Labs  Lab 03/12/21 1133 03/12/21 1858 03/12/21 2106 03/13/21 0626 03/13/21 1136  GLUCAP 114* 113* 188* 125* 111*   Iron Studies: No results for input(s): IRON, TIBC, TRANSFERRIN, FERRITIN in the last 72 hours. @lablastinr3 @ Studies/Results: No results  found. Medications:   amLODipine  5 mg Oral BID   apixaban  5 mg Oral BID   atorvastatin  20 mg Oral QHS   calcitRIOL  0.25 mcg Oral Daily   calcium carbonate  500 mg of elemental calcium Oral Q breakfast   Chlorhexidine Gluconate Cloth  6 each Topical Daily   cloNIDine  0.2 mg Oral BID   [START ON 03/15/2021] darbepoetin (ARANESP) injection - DIALYSIS  25 mcg Intravenous Q Tue-HD   folic acid  7,209 mcg Oral Daily   gabapentin  300 mg Oral QHS   hydrocerin   Topical BID   insulin aspart  0-9 Units Subcutaneous TID AC & HS   levothyroxine  112 mcg Oral QAC breakfast   metoprolol tartrate  100 mg Oral BID   sevelamer carbonate  800 mg Oral TID WC   Vitamin D (Ergocalciferol)  50,000 Units Oral Q Fri     Accepted to [facility] TTS      Assessment/ Plan: Acute Kidney Injury on chronic kidney disease stage IV: baseline creatinine of 2.43 in Nov 2022. Dialysis initiated on 02/25/21. Arranged for outpatient HD at [facility] on TTS schedule. Transferred from [facility] to CIR at [facility] for rehab.  No evidence of renal recovery. Continue HD on TTS schedule here. Next HD 03/15/2021.  Hypertension w/ chronic kidney disease: On  amlodipine, clonidine and metoprolol here. BP stable. Sig volume overload w/ diffuse LE edema. High UF goals w/ HD. Get standing weights. Unfortunately not able to remove as much volume as needed. Net UF 01/28 only 1.3 liters. Continue lowering volume as tolerated Anemia of chronic kidney disease: Hgb 7.7 s/p prbcs. Tsat 11 Ferritin 273 -Will replete. Ferrlicit 356Y IV x4.  Aranesp 25 q week started 1/23. Increase dose. Hematuria: Ureteral stent placed by urology on 02/14/2021.  With recent episode of nephrolithiasis.  No further reports of hematuria reports Secondary Hyperparathyroidism: Ca acceptable. On calcitriol. Phos 8.4 --Will start Renvela binder  Acute respiratory failure: increased O2 needed initially from her baseline of 2 liters. Weaned to 2L now. Still vol  overload.    Trachelle Low H. Zahari Fazzino NP-C 03/13/2021, 3:56 PM  [employer] [phone]

## 2021-03-13 NOTE — Progress Notes (Signed)
Occupational Therapy Session Note  Patient Details  Name: Nicole Schmidt MRN: [id number] Date of Birth: 29-Jan-1966  Today's Date: 03/13/2021 OT Individual Time: 1345-1430 OT Individual Time Calculation (min): 45 min    Short Term Goals: Week 1:  OT Short Term Goal 1 (Week 1): STG = LTG 2/2 ELOS Week 2:     Skilled Therapeutic Interventions/Progress Updates:   Patient seen this date for OT service, pt completed sit to stand transfer using a cane for dynamic and static standing balance while completing a simple IADL task in folding laundry incorporating rest breaks as  needed with CGA.  The pt was able to maintain static/dynamic standing while folding items and placing them on a table at waist level, the pt required 1 rest breat during the treatment session.  The pt maintain good 02 saturation of 97% and was able to self assess and make adjustment for  good safety awareness during the initiation and performance of  task.  The pt completed table top activities involving cards during standing and was able to maintain position.  Nursing was informed of the pt's c/o of pain associated with her thighs secondary to moisture with a skin barrier applied to address her concerns.  The pt returned to the recliner with her call light within reach, her alarm activated , and all additional needs addressed. The pt indicated at the time of my departure that her pain had subsided.  Therapy Documentation Precautions:  Precautions Precautions: Fall Precaution Comments: 2L O2 Restrictions Weight Bearing Restrictions: No RLE Weight Bearing: Weight bearing as tolerated LLE Weight Bearing: Weight bearing as tolerated General:   Vital Signs:  Pain:   Vision   Perception    Praxis   Balance   Exercises:   Other Treatments:     Therapy/Group: Individual Therapy  [name] 03/13/2021, 6:24 PM

## 2021-03-14 DIAGNOSIS — R5381 Other malaise: Secondary | ICD-10-CM | POA: Diagnosis not present

## 2021-03-14 LAB — GLUCOSE, CAPILLARY
Glucose-Capillary: 135 mg/dL — ABNORMAL HIGH (ref 70–99)
Glucose-Capillary: 118 mg/dL — ABNORMAL HIGH (ref 70–99)
Glucose-Capillary: 146 mg/dL — ABNORMAL HIGH (ref 70–99)
Glucose-Capillary: 135 mg/dL — ABNORMAL HIGH (ref 70–99)

## 2021-03-14 MED ORDER — TRAZODONE HCL 50 MG PO TABS
75.0000 mg | ORAL_TABLET | Freq: Every evening | ORAL | Status: DC | PRN
  Administered 2021-03-14 – 2021-03-16 (×2): 75 mg via ORAL
  Filled 2021-03-14 (×4): qty 2

## 2021-03-14 MED ORDER — CHLORHEXIDINE GLUCONATE CLOTH 2 % EX PADS
6.0000 | MEDICATED_PAD | Freq: Every day | CUTANEOUS | Status: DC
  Administered 2021-03-14 – 2021-03-16 (×3): 6 via TOPICAL

## 2021-03-14 MED ORDER — GERHARDT'S BUTT CREAM
TOPICAL_CREAM | Freq: Two times a day (BID) | CUTANEOUS | Status: DC
  Filled 2021-03-14: qty 1

## 2021-03-14 NOTE — Progress Notes (Signed)
Patient requests barrier cream to posterior thigh, and posterior folds due to discomfort and wetness. Posterior thighs red and excoriated. Thighs washed with soap and water, dried and house barrier cream applied. Lezlie Lye NP notified. Gerhardt cream ordered BID and Intra Dry ordered for moisture barrier. Charge Nurse to order Intra Dry. Patient resting in high back chair. No additional requests from patient. Call light and personal items all within reach.

## 2021-03-14 NOTE — Progress Notes (Signed)
PROGRESS NOTE   Subjective/Complaints: +bilateral lower extremity redness. She has discussed with nephrology who recommended taking off more fluid next dialysis scheduled. She says she was told less was taken off Saturday.   ROS: Patient denies fever, rash, sore throat, blurred vision, dizziness, nausea, vomiting, diarrhea, cough, shortness of breath or chest pain, joint or back/neck pain, headache, or mood change. +bilateral lower extremity redness  Objective:   No results found. Recent Labs    03/12/21 1524  WBC 12.5*  HGB 7.7*  HCT 25.9*  PLT 237   Recent Labs    03/12/21 1524  NA 130*  K 3.9  CL 93*  CO2 27  GLUCOSE 174*  BUN 54*  CREATININE 5.58*  CALCIUM 8.8*    Intake/Output Summary (Last 24 hours) at 03/14/2021 1302 Last data filed at 03/14/2021 0600 Gross per 24 hour  Intake 480 ml  Output 300 ml  Net 180 ml        Physical Exam: Vital Signs Blood pressure 124/60, pulse 72, temperature 97.7 F (36.5 C), temperature source Oral, resp. rate 14, height 5\' 4"  (1.626 m), weight (!) 169.1 kg, last menstrual period 07/17/2012, SpO2 98 %. Constitutional: No distress . Vital signs reviewed. HEENT: NCAT, EOMI, oral membranes moist, O2 2L Neck: supple Cardiovascular: IRR without murmur. No JVD    Respiratory/Chest: CTA Bilaterally without wheezes or rales. Normal effort    GI/Abdomen: BS +, non-tender, non-distended Ext: no clubbing, cyanosis, or edema Psych: pleasant and cooperative  Skin: RIJ permacath C/D/I, dry and scaly skin on bilateral feet, erythema of bilateral lower extremities Neurological:     Comments: Patient is alert.  No acute distress.  Mood is a bit flat but appropriate.  Follows commands.  Oriented x3. Allodynia over lateral aspect of right foot- improved. Decreased sensation bilateral feet is still present   Assessment/Plan: 1. Functional deficits which require 3+ hours per day of  interdisciplinary therapy in a comprehensive inpatient rehab setting. Physiatrist is providing close team supervision and 24 hour management of active medical problems listed below. Physiatrist and rehab team continue to assess barriers to discharge/monitor patient progress toward functional and medical goals  Care Tool:  Bathing    Body parts bathed by patient: Right arm, Left arm, Chest, Abdomen, Right upper leg, Left upper leg, Right lower leg, Left lower leg, Face         Bathing assist Assist Level: Supervision/Verbal cueing     Upper Body Dressing/Undressing Upper body dressing   What is the patient wearing?: Hospital gown only    Upper body assist Assist Level: Independent    Lower Body Dressing/Undressing Lower body dressing      What is the patient wearing?: Pants, Underwear/pull up     Lower body assist Assist for lower body dressing: Supervision/Verbal cueing Assistive Device Comment: with reacher   Toileting Toileting Toileting Activity did not occur (Clothing management and hygiene only): N/A (no void or bm)  Toileting assist Assist for toileting: Supervision/Verbal cueing     Transfers Chair/bed transfer  Transfers assist  Chair/bed transfer activity did not occur: Refused (pt sleeps in chair)  Chair/bed transfer assist level: Supervision/Verbal cueing  Locomotion Ambulation   Ambulation assist      Assist level: Supervision/Verbal cueing Assistive device: Cane-straight Max distance: 40   Walk 10 feet activity   Assist     Assist level: Supervision/Verbal cueing Assistive device: Cane-straight   Walk 50 feet activity   Assist Walk 50 feet with 2 turns activity did not occur: Safety/medical concerns (fatigue, R foot pain, weakness)  Assist level: Supervision/Verbal cueing Assistive device: Cane-straight    Walk 150 feet activity   Assist Walk 150 feet activity did not occur: Safety/medical concerns (fatigue, R foot pain,  weakness)  Assist level: Supervision/Verbal cueing Assistive device: Walker-rolling    Walk 10 feet on uneven surface  activity   Assist Walk 10 feet on uneven surfaces activity did not occur: Safety/medical concerns (fatigue, R foot pain, weakness)   Assist level: Supervision/Verbal cueing Assistive device: Cane-straight   Wheelchair     Assist Is the patient using a wheelchair?: Yes Type of Wheelchair: Manual    Wheelchair assist level: Supervision/Verbal cueing Max wheelchair distance: 51ft    Wheelchair 50 feet with 2 turns activity    Assist        Assist Level: Moderate Assistance - Patient 50 - 74% (per PT documentation)   Wheelchair 150 feet activity     Assist      Assist Level: Total Assistance - Patient < 25% (Per PT documentation)   Blood pressure 124/60, pulse 72, temperature 97.7 F (36.5 C), temperature source Oral, resp. rate 14, height 5\' 4"  (1.626 m), weight (!) 169.1 kg, last menstrual period 07/17/2012, SpO2 98 %.    Medical Problem List and Plan: 1. Functional deficits/debility secondary to acute on chronic renal failure superimposed with diastolic congestive heart failure             -patient may shower             -ELOS/Goals: 10-14 days modI             -Continue CIR therapies including PT, OT   HFU scheduled   HH referral sent to [facility].  Antithrombotics: -DVT/anticoagulation:  Pharmaceutical: Other (comment) Eliquis             -antiplatelet therapy: N/A 3. Right foot pain: Resolved. Tylenol as needed. D/c'ed Dilaudid,  -Added gabapentin HS, continue. Discussed that her pain and mobility have improved.  4. Insomnia: continue Gabapentin HS. Melatonin as needed. Increase prn trazodone to 75mg .              -antipsychotic agents: N/A 5. Neuropsych: This patient is capable of making decisions on her own behalf. 6. Skin/Wound Care: Routine skin checks 7. Fluids/Electrolytes/Nutrition: Routine in and outs with follow-up  chemistries 8.  End-stage renal disease.  Permacath placed 02/25/2021 hemodialysis initiated. Discussed recent creatinine level with her, creatinine uptrending, continue to monitor with HD labs.  9.  History of hematuria.  Left ureteral stone with recent uteroscopy/laser lithotripsy.  Follow-up urology Dr Diamantina Providence 10.  Hypothyroidism.  continue Synthroid 11.  Diabetes mellitus.  Hemoglobin A1c 7.2.  SSI. 12.  Hypothyroidism.  Continue Synthroid 13.  Hypertension.  Provided with list of foods that can help to lower blood pressure. Continue Clonidine 0.2 mg twice daily, Norvasc 5 mg twice daily.     1/28 bp well controlled 14.  Hyperlipidemia.  Continue Lipitor 15.  Atrial fibrillation/history of pulmonary emboli. Continue Eliquis.  Lopressor 100 mg twice daily 16.  Acute on chronic anemia.  Transfuse 1 unit RBCs 03/04/2021.  Follow-up CBC  17.  Obesity.  BMI 62.17.  Dietary follow-up 18.  Chronic hypoxemic respiratory failure.  Continue oxygen therapy as directed as well as CPAP. Weaned to 2L O2 19. Constipation: continue prn dulcolax.  20. Dry skin: order Eucerin cream.  21. Decreased urination: discussed likely due to fluid restriction/dialysis.  22. Bilateral lower extremity edema: continue fluid restriction, dialysis.   -elevate legs when possible 23. Bilateral lower extremity erythema: discussed plan for greater fluid to be removed next dialysis session   LOS: 10 days A FACE TO FACE EVALUATION WAS PERFORMED  Callan Yontz P Wylee Dorantes 03/14/2021, 1:02 PM

## 2021-03-14 NOTE — Progress Notes (Signed)
Occupational Therapy Session Note  Patient Details  Name: Nicole Schmidt MRN: [id number] Date of Birth: Oct 22, 1965  Today's Date: 03/14/2021 OT Individual Time: 1105-1200 & 1448-1856 OT Individual Time Calculation (min): 55 min & 39 min   Short Term Goals: Week 1:  OT Short Term Goal 1 (Week 1): STG = LTG 2/2 ELOS  Skilled Therapeutic Interventions/Progress Updates:  Session 1 Skilled OT intervention completed with focus on self-care, functional endurance, BUE strengthening. Pt received seated in recliner, agreeable to session. Pt very lethargic this session, requiring increased time to arouse and become alert enough for participation, with therapist opening window shade for environmental stimuli. Nurse in room to administer meds. Pt completed sit > stand without AD with supervision, then used SPC to ambulate to sink for oral care in standing with supervision. Pt wearing [place], on 2L O2, with 93% sat, and remained throughout session with rest breaks provided. Pt functionally ambulated 25 ft at a time to [facility] therapy gym using [facility] with supervision and w/c follow for rest breaks. Pt demonstrating deficits in endurance, with repeated rest breaks needed and pt SOB, with pt self-initiating pursed lip breathing to return to 93% O2 sat. Seated on mat, pt participated in BUE strengthening to promote increased endurance needed for self-care tasks using 4 pound med ball including the following exercises: chest presses 3x15, core rotation with chest press 2x15. Cues needed for form and technique, with pt requiring increased time for rest breaks due to fatigue. Transported pt in w/c with total A back to room, then assisted pt with ambulatory transfer using SPC to toilet for supervised toileting tasks. Pt with continent void. Pt left seated in recliner with chair alarm on, and all needs in reach at end of session.  Session 2 Skilled OT intervention completed with focus on energy conservation/home management education,  BUE strengthening. Pt received seated in recliner, agreeable to session. Pt wearing NS, on 2L, on 93% sat, and remained throughout session. Pt reporting fatigue this session from walking with OT in earlier session. Issued pt a handout for energy conservation strategies to use at home due to decreased endurance, with pt able to verbalize 2 ways to modify or set up her home environment for safety and need of rest breaks. To increase independence with HEP handout for exercises, pt followed handout for instructions to set up form with therapist present for supervision and cues needed throughout to correct form. Exercises included the following with orange theraband:   Bicep flexion x15 each arm Shoulder flexion x15 each arm, with pt standing with supervision Horizontal abduction x15 Chest presses x15 Shoulder external rotation x15 Shoulder extension x15 each arm, with pt standing with supervision  Pt required rest breaks in between exercises due to fatigue. Pt left seated in recliner, with chair alarm on and all needs in reach at end of session.   Therapy Documentation Precautions:  Precautions Precautions: Fall Precaution Comments: 2L O2 Restrictions Weight Bearing Restrictions: No RLE Weight Bearing: Weight bearing as tolerated LLE Weight Bearing: Weight bearing as tolerated  Pain: No c/o    Therapy/Group: Individual Therapy  Kayson Tasker E Roni Friberg 03/14/2021, 7:31 AM

## 2021-03-14 NOTE — Progress Notes (Signed)
[facility] Progress Note   Subjective:   Reports shortness of breath with dyspnea and peripheral edema. She is concerned that her weight is trending up, not down. No CP, palpitations, dizziness, headaches or nausea.   Objective Vitals:   03/13/21 1351 03/13/21 1955 03/14/21 0438 03/14/21 0500  BP: (!) 129/56 (!) 104/46 124/60   Pulse: 80 75 72   Resp: 18 14 14    Temp: 98 F (36.7 C) 98 F (36.7 C) 97.7 F (36.5 C)   TempSrc: Oral Oral Oral   SpO2: 97% 100% 98%   Weight:    (!) 169.1 kg  Height:       Physical Exam General: Alert female in NAD Heart: RRR, no murmur Lungs: CTA bilaterally without wheezing, rhonchi or rales. On O2 3L via [place] Abdomen: Soft, non-tender, +BS Extremities: 3+ pitting edema b/l lower extremities Dialysis Access: [facility] with dry, intact bandage  Additional Objective Labs: Basic Metabolic Panel: Recent Labs  Lab 03/08/21 0545 03/10/21 1949 03/12/21 1524  NA 130* 130* 130*  K 4.1 3.6 3.9  CL 96* 95* 93*  CO2 23 26 27   GLUCOSE 111* 197* 174*  BUN 71* 29* 54*  CREATININE 6.61* 3.49* 5.58*  CALCIUM 8.2* 7.9* 8.8*  PHOS 8.4* 4.0 7.2*   Liver Function Tests: Recent Labs  Lab 03/08/21 0545 03/10/21 1949 03/12/21 1524  ALBUMIN 2.4* 2.5* 2.5*   No results for input(s): LIPASE, AMYLASE in the last 168 hours. CBC: Recent Labs  Lab 03/08/21 0545 03/10/21 1949 03/12/21 1524  WBC 11.0* 11.9* 12.5*  HGB 7.5* 8.0* 7.7*  HCT 24.9* 26.6* 25.9*  MCV 92.2 90.8 94.5  PLT 214 228 237   Blood Culture    Component Value Date/Time   SDES WOUND 09/30/2019 1504   SPECREQUEST Groin, Right 09/30/2019 1504   CULT ABUNDANT MORGANELLA MORGANII 09/30/2019 1504   REPTSTATUS 10/03/2019 FINAL 09/30/2019 1504    Cardiac Enzymes: No results for input(s): CKTOTAL, CKMB, CKMBINDEX, TROPONINI in the last 168 hours. CBG: Recent Labs  Lab 03/13/21 0626 03/13/21 1136 03/13/21 1719 03/13/21 2146 03/14/21 0558  GLUCAP 125* 111* 127* 177*  135*   Iron Studies: No results for input(s): IRON, TIBC, TRANSFERRIN, FERRITIN in the last 72 hours. @lablastinr3 @ Studies/Results: No results found. Medications:   amLODipine  5 mg Oral BID   apixaban  5 mg Oral BID   atorvastatin  20 mg Oral QHS   calcitRIOL  0.25 mcg Oral Daily   calcium carbonate  500 mg of elemental calcium Oral Q breakfast   Chlorhexidine Gluconate Cloth  6 each Topical Daily   cloNIDine  0.2 mg Oral BID   [START ON 03/15/2021] darbepoetin (ARANESP) injection - DIALYSIS  60 mcg Intravenous Q Tue-HD   folic acid  5,053 mcg Oral Daily   gabapentin  300 mg Oral QHS   hydrocerin   Topical BID   insulin aspart  0-9 Units Subcutaneous TID AC & HS   levothyroxine  112 mcg Oral QAC breakfast   metoprolol tartrate  100 mg Oral BID   sevelamer carbonate  800 mg Oral TID WC   Vitamin D (Ergocalciferol)  50,000 Units Oral Q Fri    Dialysis Orders: Accepted to [facility]TS   Assessment/Plan: Acute Kidney Injury on chronic kidney disease stage IV: baseline creatinine of 2.43 in Nov 2022. Dialysis initiated on 02/25/21. Arranged for outpatient HD at [facility] on TTS schedule. Transferred from [facility] to CIR [facility] for rehab.  No evidence of renal  recovery. Continue HD on TTS schedule here. Next HD 03/15/2021.  Hypertension w/ chronic kidney disease: On amlodipine, clonidine and metoprolol here. BP stable. Sig volume overload w/ diffuse LE edema. High UF goals w/ HD.Net UF 01/28 only 1.3 liters- unclear why, she says she slept through treatment. Will attempt higher UF goal tomorrow and will titrate down BP meds if she is unable to tolerate.  Anemia of chronic kidney disease: Hgb 7.5, s/p prbcs. Tsat 11 Ferritin 273 -Will replete. Ferrlicit 031R IV x4.  Aranesp 25 q week started 1/23. Increase dose. Hematuria: Ureteral stent placed by urology on 02/14/2021.  With recent episode of nephrolithiasis.  No further reports of hematuria reports Secondary  Hyperparathyroidism: Ca acceptable. On calcitriol. Phos 8.4 --Will start Renvela binder  Acute respiratory failure: increased O2 needed initially from her baseline of 2 liters. Still vol overload.    Anice Paganini, PA-C 03/14/2021, 9:03 AM  [facility] Pager: [phone]

## 2021-03-14 NOTE — Discharge Summary (Signed)
Physician Discharge Summary  Patient ID: Nicole Schmidt MRN: [id number] DOB/AGE: 1966/01/16 56 y.o.  Admit date: 03/04/2021 Discharge date: 03/16/2021  Discharge Diagnoses:  Principal Problem:   Debility Acute on chronic renal failure History of hematuria Hypothyroidism Diabetes mellitus Hypertension Hyperlipidemia Atrial fibrillation with history of pulmonary emboli Acute on chronic anemia Obesity Chronic hypoxemic respiratory failure Constipation Chronic diastolic congestive heart failure  Discharged Condition: Stable  Significant Diagnostic Studies: DG Chest 2 View  Result Date: 02/23/2021 CLINICAL DATA:  Shortness of breath EXAM: CHEST - 2 VIEW COMPARISON:  Previous studies including the examination of 02/08/2021 FINDINGS: Transverse diameter of heart is increased. There is subtle prominence of interstitial markings in the parahilar regions. There is interval decrease in patchy linear densities in the parahilar regions and lower lung fields. There is no new focal pulmonary consolidation. Lateral CP angles are essentially clear. There is no pneumothorax. IMPRESSION: Cardiomegaly. Central pulmonary vessels are prominent. There is subtle prominence of interstitial markings in the parahilar regions, more so on the right side, possibly suggesting mild asymmetric interstitial pulmonary edema. There is interval decrease in patchy infiltrates in the parahilar regions and lower lung fields suggesting resolving atelectasis/pneumonia. Electronically Signed   By: Elmer Picker M.D.   On: 02/23/2021 18:13   PERIPHERAL VASCULAR CATHETERIZATION  Result Date: 02/24/2021 See surgical note for result.  DG OR UROLOGY CYSTO IMAGE ([facility] ONLY)  Result Date: 02/14/2021 There is no interpretation for this exam.  This order is for images obtained during a surgical procedure.  Please See "Surgeries" Tab for more information regarding the procedure.    Labs:  Basic Metabolic Panel: Recent Labs   Lab 03/10/21 1949 03/12/21 1524  NA 130* 130*  K 3.6 3.9  CL 95* 93*  CO2 26 27  GLUCOSE 197* 174*  BUN 29* 54*  CREATININE 3.49* 5.58*  CALCIUM 7.9* 8.8*  PHOS 4.0 7.2*    CBC: Recent Labs  Lab 03/10/21 1949 03/12/21 1524 03/15/21 1454  WBC 11.9* 12.5* 10.9*  HGB 8.0* 7.7* 7.4*  HCT 26.6* 25.9* 25.7*  MCV 90.8 94.5 96.6  PLT 228 237 248    CBG: Recent Labs  Lab 03/14/21 2123 03/15/21 0620 03/15/21 1153 03/15/21 1834 03/15/21 2102  GLUCAP 135* 111* 125* 126* 160*   Family history.  Father with diabetes hypertension and Parkinson's disease.  Mother with hypertension and atrial fibrillation.  Denies any breast cancer esophageal cancer or rectal cancer  Brief HPI:   [name] is a 56 y.o. right-handed female with history of chronic hypoxemic respiratory failure on 2 L oxygen diabetes mellitus breast cancer history of hematuria left ureteral stone with recent left uteroscopy with laser lithotripsy and stent exchange with stent per Dr. Diamantina Providence, diabetes mellitus hypertension hyperlipidemia gout morbid obesity with BMI 62.17 atrial fibrillation pulmonary emboli on Eliquis.  Recent admission 02/01/2021 - 02/22/2021 for management of acute on chronic diastolic congestive heart failure and AKI superimposed on CKD stage III.  Per chart review lives alone.  Presented to [facility] 02/23/2021 with increasing lower extremity edema and increasing shortness of breath with diminished urine output.  Chest x-ray showed cardiomegaly subtle prominence of interstitial markings in the perihilar regions more so on the right side possibly suggesting mild asymmetric interstitial pulmonary edema.  Noted interval decrease in patchy infiltrates in the perihilar regions and lower lung fields suggesting resolving atelectasis pneumonia.  Admission chemistries glucose 128 BUN 136 creatinine 5.82 WBC 16,600 troponin 10 BNP 375.  Follow-up neurology services permacath was  placed 02/25/2021  hemodialysis initiated.  She remained on chronic Eliquis for history of atrial fibrillation pulmonary emboli.  Therapy evaluations completed due to patient decreased functional mobility was admitted for a comprehensive rehab program.   Hospital Course: ANNETE AYUSO was admitted to rehab 03/04/2021 for inpatient therapies to consist of PT, ST and OT at least three hours five days a week. Past admission physiatrist, therapy team and rehab RN have worked together to provide customized collaborative inpatient rehab.  Pertaining to patient's debility related to acute on chronic renal failure superimposed with diastolic congestive heart failure remained stable she was attending therapies.  Permacath placed 02/25/2021 hemodialysis initiated.  He remained on chronic Eliquis for history of atrial fibrillation pulmonary emboli no bleeding episodes.  History of hematuria left ureteral stone recent laser lithotripsy follow-up outpatient urology services.  Hormone supplement ongoing for hypothyroidism.  Diabetes mellitus monitored hemoglobin A1c 7.2 diabetic diet.  Synthroid for hypothyroidism.  Blood pressure remained somewhat soft maintained on clonidine and Norvasc would need outpatient follow-up.  Lipitor for hyperlipidemia.  Acute on chronic anemia follow-up CBC with hemoglobin 7.7.  Chronic hypoxemic respiratory failure oxygen therapy as directed as well as CPAP.  Noted obesity BMI 62.17 with dietary follow-up.   Blood pressures were monitored on TID basis and soft and monitored  Diabetes has been monitored with ac/hs CBG checks and SSI was use prn for tighter BS control.    Rehab course: During patient's stay in rehab weekly team conferences were held to monitor patient's progress, set goals and discuss barriers to discharge. At admission, patient required supervision 55 feet rolling walker supervision sit to stand  Physical exam.  Blood pressure 118/60 pulse 80 temperature 98 respirations 18 oxygen  saturation 92% 2 L oxygen HEENT Head.  Normocephalic and atraumatic Eyes.  Pupils round and reactive to light no discharge nystagmus Neck.  Supple nontender no JVD without thyromegaly Cardiac regular rate rhythm any extra sounds or murmur heard Abdomen.  Soft nontender positive bowel sounds without rebound Respiratory effort normal no respiratory distress without wheeze Skin.  Right IJ permacath in place Neurologic.  Alert oriented mood was a bit flat but appropriate   He/She  has had improvement in activity tolerance, balance, postural control as well as ability to compensate for deficits. He/She has had improvement in functional use RUE/LUE  and RLE/LLE as well as improvement in awareness.  Transfers sit to stand supervision.  Ambulates short household distances with assistive device working with energy conservation monitoring oxygen saturations.  Completed sit to stand transfers using a cane for dynamic standing for ADLs.  Independent task and folding laundry incorporating rest breaks.  She was able to maintain static dynamic standing while folding items and placing them on a table.  Full family teaching completed plan discharged home       Disposition: Discharged to home    Diet: Renal diet  Special Instructions: No driving smoking or alcohol  Continue hemodialysis as directed.  Continue oxygen therapy as prior to admission.  Medications at discharge. 1.  Tylenol as needed 2.  Norvasc 5 mg p.o.  daily 3.  Eliquis 5 mg p.o. twice daily 4.  Lipitor 20 mg p.o. nightly 5.  Rocaltrol 0.25 mcg p.o. daily 6.  Os-Cal 1 tablet daily 7.  Clonidine 0.2 mg p.o. twice daily 8.  Aranesp weekly with hemodialysis 9.  Folic acid 1 mg p.o. daily 10.  Neurontin 300 mg p.o. nightly 11.  Synthroid 112 mcg p.o. daily before breakfast 12.  Lopressor 100 mg p.o. twice daily 13.  Renvela 800 mg p.o. 3 times daily 14.  Vitamin D 50,000 units every Friday 15.Dulaglutide 1.5 mg into the skin  every 7 days 16.  Melatonin 10 mg p.o. nightly as needed 17.  Albuterol inhaler 2 puffs every 6 hours as needed shortness of breath 18 Tramadol 50 mg every 6 hour as needed pain  30-35 minutes were spent completing discharge summary and discharge planning  Discharge Instructions     Ambulatory referral to Physical Medicine Rehab   Complete by: As directed    Moderate complexity follow-up 1 to 2 weeks acute on chronic renal failure/multi medical        Follow-up Information     Raulkar, Clide Deutscher, MD Follow up.   Specialty: Physical Medicine and Rehabilitation Why: 06/28/21 please arrive at 1:00pm for 1:20pm follow-up, thank you! Contact information: [address] [place] [address] [phone]         Billey Co, MD Follow up.   Specialty: Urology Why: Call for appointment Contact information: [place] [address] [phone]         Lavonia Dana, MD Follow up.   Specialty: Nephrology Why: Call for appointment Contact information: [address] [place] [address] [phone]         [facility] Follow up.   Why: Schedule is Tuesday/Thursday/Saturday Arrive at 10:15 to complete paperwork on 2/2. Patient has 10:45 chair time. Contact information: [address] [place] [address] [phone]                 Signed: Lavon Paganini King 03/16/2021, 5:22 AM

## 2021-03-14 NOTE — Progress Notes (Signed)
Physical Therapy Session Note  Patient Details  Name: Nicole Schmidt MRN: [id number] Date of Birth: 08-06-1965  Today's Date: 03/14/2021 PT Individual Time: 1914-7829 and 5621-3086 PT Individual Time Calculation (min): 57 min and 40 min  Short Term Goals: Week 1:  PT Short Term Goal 1 (Week 1): STG=LTG due to LOS  Skilled Therapeutic Interventions/Progress Updates:   Treatment Session 1 Received pt sitting in recliner, pt agreeable to PT treatment, and denied any pain during session but reports new skin breakdown on buttocks from prolonged sitting in recliner - RN aware. Emphasized importance of pressure relief and standing each hour - will communicate with nursing staff. Session with emphasis on functional mobility, generalized strengthening and endurance, dynamic standing balance, and simulated car transfers. Pt on 2L O2 via [place] with sat 91% at rest decreasing at low as 84% with activity (however, unsure of accuracy of pulse oximeter). Donned pants with max A for time management purposes and stood to pull pants over hips with supervision. Pt performed all transfers with [facility] and supervision throughout session. Pt transported to/from room in [facility] dependently for time management and energy conservation purposes. Attempted simulated car transfer with SPC via sit and scoot method and side stepping (although not recommended), however pt unable to lift LLE high enough to enter car via side stepping and unable to bend RLE to get into car via sit and scoot method - pt unable to scoot buttocks further back in car due to new pain along buttocks and body habitus, despite assist from therapist - pt also reported increased difficulty due to swelling in LEs. Pt ambulated 28ft on uneven surfaces (ramp) with SPC and supervision, occasionally holding onto railing on L for additional support. Pt reported feeling tired afterwards, requiring extensive seated rest break. Pt then performed BUE strengthening on UBE at level 2  alternating 1 minute forward and 1 minute backwards for a total of 6 minutes with 2 seated rest breaks. Concluded session with pt sitting in recliner with all needs within reach.   Treatment Session 2 Received pt sitting in recliner, pt agreeable to PT treatment, and denied any pain during session but discomfort along buttocks from sitting. Session with emphasis on functional mobility, generalized strengthening and endurance, dynamic standing balance, and gait training. Pt performed all transfers with [facility] and supervision. Planned to walk to gym on [facility], however upon standing pt stated "I don't know how far I can walk today" and then "I don't think I can walk at all" due to fatigue from previous therapies. Pt transported to/from room in [facility] dependently for time management purposes then performed the following exercises with supervision and verbal cues for technique: -seated hip flexion 2x12 bilaterally -seated LAQ 2x12 bilaterally -seated hip abduction with orange TB 2x15 -seated hamstring curls with orange TB 2x12 -standing heel raises 2x10 Pt ambulated 68ft with SPC and supervision but stopped due to fatigue. Discussed recommendation for HHPT upon D/C to continue working on strength and endurance. Concluded session with pt sitting in recliner with all needs within reach.   Therapy Documentation Precautions:  Precautions Precautions: Fall Precaution Comments: 2L O2 Restrictions Weight Bearing Restrictions: No RLE Weight Bearing: Weight bearing as tolerated LLE Weight Bearing: Weight bearing as tolerated  Therapy/Group: Individual Therapy Alfonse Alpers PT, DPT   03/14/2021, 7:13 AM

## 2021-03-14 NOTE — Progress Notes (Signed)
Patient ID: Nicole Schmidt, female   DOB: 1965/12/14, 56 y.o.   MRN: [id number]  Patient [facility] referral sent to [facility].

## 2021-03-14 NOTE — Progress Notes (Signed)
Occupational Therapy Discharge Summary  Patient Details  Name: Nicole Schmidt MRN: [id number] Date of Birth: [birth year]  Patient has met 10 of 10 long term goals due to improved activity tolerance, improved balance, improved awareness, and improved coordination.  Patient to discharge at overall Modified Independent level.  Patient's friend is physically independent to provide occasional assist with higher level tasks at discharge, however pt is discharging home alone at the mod I level.  Reasons goals not met: n/a  Recommendation:  Patient will benefit from ongoing skilled OT services in home health setting to continue to advance functional skills in the area of BADL and iADL.  Equipment: N/a  Reasons for discharge: treatment goals met  Patient/family agrees with progress made and goals achieved: Yes  OT Discharge Precautions/Restrictions  Precautions Precautions: Fall Restrictions Weight Bearing Restrictions: No RLE Weight Bearing: Weight bearing as tolerated LLE Weight Bearing: Weight bearing as tolerated ADL ADL Equipment Provided: Long-handled sponge Eating: Independent Where Assessed-Eating: Chair Grooming: Independent Where Assessed-Grooming: Standing at sink Upper Body Bathing: Independent Where Assessed-Upper Body Bathing: Chair Lower Body Bathing: Modified independent Where Assessed-Lower Body Bathing: Chair Upper Body Dressing: Independent Where Assessed-Upper Body Dressing: Chair Lower Body Dressing: Modified independent Where Assessed-Lower Body Dressing: Chair Toileting: Modified independent Where Assessed-Toileting: Risk analyst Method: Counselling psychologist: Raised Adult nurse: Not assessed Social research officer, government: Modified independent Social research officer, government Method: Heritage manager: Grab bars Vision Baseline Vision/History: 1 Wears glasses Patient  Visual Report: No change from baseline Vision Assessment?: No apparent visual deficits Perception  Perception: Within Functional Limits Praxis Praxis: Intact Cognition Overall Cognitive Status: Within Functional Limits for tasks assessed Arousal/Alertness: Lethargic Orientation Level: Oriented X4 Year: 2023 Month: January Day of Week: Incorrect Memory: Appears intact Immediate Memory Recall: Sock;Blue;Bed Memory Recall Sock: Without Cue Memory Recall Blue: Without Cue Memory Recall Bed: Without Cue Awareness: Appears intact Problem Solving: Appears intact Safety/Judgment: Appears intact Sensation Sensation Light Touch: Appears Intact Hot/Cold: Appears Intact Proprioception: Appears Intact Stereognosis: Not tested Coordination Gross Motor Movements are Fluid and Coordinated: No Fine Motor Movements are Fluid and Coordinated: Yes Coordination and Movement Description: mild incoordination due to pain, body habitus with wide BOS, and generalized weakness/deconditioning Finger Nose Finger Test: [facility] bilaterally Motor  Motor Motor: Within Functional Limits Motor - Skilled Clinical Observations: generalized weakness/deconditioning Mobility  Bed Mobility Bed Mobility: Rolling Right;Rolling Left;Sit to Supine;Supine to Sit Rolling Right: Independent with assistive device Rolling Left: Independent with assistive device Supine to Sit: Independent with assistive device Sit to Supine: Independent with assistive device Transfers Sit to Stand: Independent with assistive device Stand to Sit: Independent with assistive device  Trunk/Postural Assessment  Cervical Assessment Cervical Assessment: Exceptions to [facility] (forward head) Thoracic Assessment Thoracic Assessment: Exceptions to [facility] (mild kyphosis) Lumbar Assessment Lumbar Assessment: Exceptions to [facility] (anterior pelvic tilt due to body habitus) Postural Control Postural Control: Within Functional Limits   Balance Balance Balance Assessed: Yes Static Sitting Balance Static Sitting - Balance Support: Feet supported;Bilateral upper extremity supported Static Sitting - Level of Assistance: 7: Independent Dynamic Sitting Balance Dynamic Sitting - Balance Support: Feet supported;No upper extremity supported Dynamic Sitting - Level of Assistance: 6: Modified independent (Device/Increase time) Static Standing Balance Static Standing - Balance Support: Right upper extremity supported (SPC) Static Standing - Level of Assistance: 6: Modified independent (Device/Increase time) Dynamic Standing Balance Dynamic Standing - Balance Support: Right upper extremity supported (SPC) Dynamic Standing - Level of Assistance: 6: Modified  independent (Device/Increase time) Extremity/Trunk Assessment RUE Assessment RUE Assessment: Within Functional Limits LUE Assessment LUE Assessment: Within Functional Limits   Artavius Stearns E Vivian Okelley 03/14/2021, 11:17 AM

## 2021-03-15 ENCOUNTER — Other Ambulatory Visit (HOSPITAL_COMMUNITY): Payer: Self-pay

## 2021-03-15 DIAGNOSIS — R5381 Other malaise: Secondary | ICD-10-CM | POA: Diagnosis not present

## 2021-03-15 LAB — CBC
HCT: 25.7 % — ABNORMAL LOW (ref 36.0–46.0)
Hemoglobin: 7.4 g/dL — ABNORMAL LOW (ref 12.0–15.0)
MCH: 27.8 pg (ref 26.0–34.0)
MCHC: 28.8 g/dL — ABNORMAL LOW (ref 30.0–36.0)
MCV: 96.6 fL (ref 80.0–100.0)
Platelets: 248 10*3/uL (ref 150–400)
RBC: 2.66 MIL/uL — ABNORMAL LOW (ref 3.87–5.11)
RDW: 16.2 % — ABNORMAL HIGH (ref 11.5–15.5)
WBC: 10.9 10*3/uL — ABNORMAL HIGH (ref 4.0–10.5)
nRBC: 1.2 % — ABNORMAL HIGH (ref 0.0–0.2)

## 2021-03-15 LAB — GLUCOSE, CAPILLARY
Glucose-Capillary: 111 mg/dL — ABNORMAL HIGH (ref 70–99)
Glucose-Capillary: 125 mg/dL — ABNORMAL HIGH (ref 70–99)
Glucose-Capillary: 126 mg/dL — ABNORMAL HIGH (ref 70–99)
Glucose-Capillary: 160 mg/dL — ABNORMAL HIGH (ref 70–99)

## 2021-03-15 MED ORDER — APIXABAN 5 MG PO TABS
5.0000 mg | ORAL_TABLET | Freq: Two times a day (BID) | ORAL | 0 refills | Status: DC
  Filled 2021-03-15: qty 60, 30d supply, fill #0

## 2021-03-15 MED ORDER — TRAMADOL HCL 50 MG PO TABS
50.0000 mg | ORAL_TABLET | Freq: Four times a day (QID) | ORAL | 0 refills | Status: DC | PRN
  Filled 2021-03-15: qty 30, 7d supply, fill #0

## 2021-03-15 MED ORDER — SEVELAMER CARBONATE 800 MG PO TABS
800.0000 mg | ORAL_TABLET | Freq: Three times a day (TID) | ORAL | 0 refills | Status: DC
  Filled 2021-03-15: qty 90, 30d supply, fill #0

## 2021-03-15 MED ORDER — TRAMADOL HCL 50 MG PO TABS
50.0000 mg | ORAL_TABLET | Freq: Four times a day (QID) | ORAL | Status: DC | PRN
  Administered 2021-03-15: 50 mg via ORAL
  Filled 2021-03-15: qty 1

## 2021-03-15 MED ORDER — ALBUTEROL SULFATE HFA 108 (90 BASE) MCG/ACT IN AERS
2.0000 | INHALATION_SPRAY | Freq: Four times a day (QID) | RESPIRATORY_TRACT | 0 refills | Status: DC | PRN
  Filled 2021-03-15: qty 8.5, 25d supply, fill #0

## 2021-03-15 MED ORDER — SODIUM CHLORIDE 0.9 % IV SOLN: 100.0000 mL | INTRAVENOUS | Status: DC | PRN

## 2021-03-15 MED ORDER — ACETAMINOPHEN 325 MG PO TABS: 650.0000 mg | ORAL_TABLET | Freq: Four times a day (QID) | ORAL | Status: AC | PRN

## 2021-03-15 MED ORDER — ALBUMIN HUMAN 25 % IV SOLN: 25.0000 g | Freq: Once | INTRAVENOUS | Status: AC

## 2021-03-15 MED ORDER — PENTAFLUOROPROP-TETRAFLUOROETH EX AERO: 1.0000 "application " | INHALATION_SPRAY | CUTANEOUS | Status: DC | PRN

## 2021-03-15 MED ORDER — CHLORHEXIDINE GLUCONATE CLOTH 2 % EX PADS
6.0000 | MEDICATED_PAD | Freq: Every day | CUTANEOUS | Status: DC
  Administered 2021-03-15 – 2021-03-16 (×2): 6 via TOPICAL

## 2021-03-15 MED ORDER — GABAPENTIN 300 MG PO CAPS
300.0000 mg | ORAL_CAPSULE | Freq: Every day | ORAL | 0 refills | Status: DC
  Filled 2021-03-15: qty 30, 30d supply, fill #0

## 2021-03-15 MED ORDER — CLONIDINE HCL 0.2 MG PO TABS
0.2000 mg | ORAL_TABLET | Freq: Two times a day (BID) | ORAL | 0 refills | Status: DC
  Filled 2021-03-15: qty 60, 30d supply, fill #0

## 2021-03-15 MED ORDER — GERHARDT'S BUTT CREAM
3.0000 "application " | TOPICAL_CREAM | Freq: Three times a day (TID) | CUTANEOUS | 0 refills | Status: DC
  Filled 2021-03-15: qty 3, 1d supply, fill #0

## 2021-03-15 MED ORDER — ALTEPLASE 2 MG IJ SOLR: 2.0000 mg | Freq: Once | INTRAMUSCULAR | Status: DC | PRN

## 2021-03-15 MED ORDER — MELATONIN 5 MG PO TABS
10.0000 mg | ORAL_TABLET | Freq: Every evening | ORAL | 0 refills | Status: DC | PRN
  Filled 2021-03-15: qty 60, 30d supply, fill #0

## 2021-03-15 MED ORDER — CALCIUM CARBONATE 1500 (600 CA) MG PO TABS
600.0000 mg | ORAL_TABLET | Freq: Every day | ORAL | 0 refills | Status: DC
  Filled 2021-03-15: qty 30, 30d supply, fill #0

## 2021-03-15 MED ORDER — ATORVASTATIN CALCIUM 20 MG PO TABS
20.0000 mg | ORAL_TABLET | Freq: Every day | ORAL | 0 refills | Status: DC
  Filled 2021-03-15: qty 30, 30d supply, fill #0

## 2021-03-15 MED ORDER — LIDOCAINE-PRILOCAINE 2.5-2.5 % EX CREA: 1.0000 "application " | TOPICAL_CREAM | CUTANEOUS | Status: DC | PRN

## 2021-03-15 MED ORDER — TRAMADOL HCL 50 MG PO TABS
50.0000 mg | ORAL_TABLET | Freq: Once | ORAL | Status: AC
  Administered 2021-03-15: 50 mg via ORAL
  Filled 2021-03-15: qty 1

## 2021-03-15 MED ORDER — AMLODIPINE BESYLATE 5 MG PO TABS
5.0000 mg | ORAL_TABLET | Freq: Two times a day (BID) | ORAL | 0 refills | Status: DC
  Filled 2021-03-15: qty 60, 30d supply, fill #0

## 2021-03-15 MED ORDER — HEPARIN SODIUM (PORCINE) 1000 UNIT/ML DIALYSIS
1000.0000 [IU] | INTRAMUSCULAR | Status: DC | PRN
  Administered 2021-03-15: 4000 [IU] via INTRAVENOUS_CENTRAL

## 2021-03-15 MED ORDER — DULAGLUTIDE 1.5 MG/0.5ML ~~LOC~~ SOAJ
1.5000 mg | SUBCUTANEOUS | 0 refills | Status: DC
  Filled 2021-03-15: qty 0.5, 7d supply, fill #0

## 2021-03-15 MED ORDER — GERHARDT'S BUTT CREAM
TOPICAL_CREAM | Freq: Three times a day (TID) | CUTANEOUS | Status: DC
  Filled 2021-03-15: qty 1

## 2021-03-15 MED ORDER — ALBUMIN HUMAN 25 % IV SOLN
INTRAVENOUS | Status: AC
  Administered 2021-03-15: 25 g via INTRAVENOUS
  Filled 2021-03-15: qty 100

## 2021-03-15 MED ORDER — ERGOCALCIFEROL 1.25 MG (50000 UT) PO CAPS
50000.0000 [IU] | ORAL_CAPSULE | ORAL | 0 refills | Status: DC
  Filled 2021-03-15: qty 4, 28d supply, fill #0

## 2021-03-15 MED ORDER — METOPROLOL TARTRATE 100 MG PO TABS
100.0000 mg | ORAL_TABLET | Freq: Two times a day (BID) | ORAL | 0 refills | Status: DC
  Filled 2021-03-15: qty 60, 30d supply, fill #0

## 2021-03-15 MED ORDER — LEVOTHYROXINE SODIUM 112 MCG PO TABS
112.0000 ug | ORAL_TABLET | Freq: Every day | ORAL | 0 refills | Status: DC
  Filled 2021-03-15: qty 30, 30d supply, fill #0

## 2021-03-15 MED ORDER — FOLIC ACID 1 MG PO TABS
1000.0000 ug | ORAL_TABLET | Freq: Every day | ORAL | 0 refills | Status: DC
  Filled 2021-03-15: qty 30, 30d supply, fill #0

## 2021-03-15 MED ORDER — CALCITRIOL 0.25 MCG PO CAPS
0.2500 ug | ORAL_CAPSULE | Freq: Every day | ORAL | 0 refills | Status: DC
  Filled 2021-03-15: qty 30, 30d supply, fill #0

## 2021-03-15 MED ORDER — LIDOCAINE HCL (PF) 1 % IJ SOLN: 5.0000 mL | INTRAMUSCULAR | Status: DC | PRN

## 2021-03-15 NOTE — Progress Notes (Signed)
Occupational Therapy Session Note  Patient Details  Name: Nicole Schmidt MRN: [id number] Date of Birth: 10-25-1965  Today's Date: 03/15/2021 OT Individual Time: 2952-8413 OT Individual Time Calculation (min): 38 min    Short Term Goals: Week 1:  OT Short Term Goal 1 (Week 1): STG = LTG 2/2 ELOS   Skilled Therapeutic Interventions/Progress Updates:    Pt greeted at time of session just finishing up with previous OT, hand off to this OT. Pt stating ADL needs met, and she is fatigued from therapy this AM. Focus of session on DC planning and discussion for transport to/from dialysis once at home, home management, and IADLs with pt very receptive. Remainder of session focused on BUE strengthening with orange level theraband according to HEP given by primary OT, pt with good recall of form and exercises from previous sessions, performing 1x10 of each with occasional cues for form. Up in chair alarm call bell in reach.    Therapy Documentation Precautions:  Precautions Precautions: Fall Precaution Comments: 2L O2 Restrictions Weight Bearing Restrictions: No RLE Weight Bearing: Weight bearing as tolerated LLE Weight Bearing: Weight bearing as tolerated     Therapy/Group: Individual Therapy  Viona Gilmore 03/15/2021, 7:30 AM

## 2021-03-15 NOTE — Progress Notes (Signed)
Inpatient Rehabilitation Discharge Medication Review by a Pharmacist  A complete drug regimen review was completed for this patient to identify any potential clinically significant medication issues.  High Risk Drug Classes Is patient taking? Indication by Medication  Antipsychotic No   Anticoagulant Yes Apixaban- A. 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    Other  PTA meds: Epogen Epogen is given during hemodialysis and will be continued by Nephrology    Clinically significant medication issues were identified that warrant physician communication and completion of prescribed/recommended actions by midnight of the next day:  No  Time spent performing this drug regimen review (minutes):  30   Secundino Ellithorpe BS, PharmD, BCPS Clinical Pharmacist 03/15/2021 10:30 AM

## 2021-03-15 NOTE — Consult Note (Addendum)
[place] Nurse Consult Note: Reason for Consult: Consult requested for posterior thighs.  Pt c/o large amt pain at this site and has partial thickness skin loss related to moisture associated skin damage. This is complicated by the need to scoot forward in the recliner chair to stand and also has sheer injuries to the affected areas.  Bilat posterior thighs are red, macerated, with partial thickness skin loss where skin is beginning to loosen and peel; approx 3X3X.1cm on each side.  Pt has a high BMI and there are significant skin folds; it is difficult to keep the area from being moist all the time.   ICD-10 CM Codes for Irritant Dermatitis L24A9 - Due to friction or contact with other specified body fluids L30.4  - Erythema intertrigo; chafing of the skin, dermatitis due to sweating and friction, friction dermatitis, friction eczema, and genital/thigh intertrigo.   Dressing procedure/placement/frequency: Topical treatment orders provided to decrease discomfort and promote healing as follows: Apply Gerhardts cream to posterior bilat upper thighs TID and PRN for discomfort, then apply antifungal powder over the top.  Leave sacrum dressing off and skin open to air. Please re-consult if further assistance is needed.  Thank-you,  Julien Girt MSN, [place], [place], [place], [place]

## 2021-03-15 NOTE — Progress Notes (Signed)
PROGRESS NOTE   Subjective/Complaints: Complains of pain from right thing pressure injury. Had benefit from Tramadol. Discussed mechanism of action of Tramadol.  Discussed d/c planning for tomorrow.   ROS: Patient denies fever, rash, sore throat, blurred vision, dizziness, nausea, vomiting, diarrhea, cough, shortness of breath or chest pain, joint or back/neck pain, headache, or mood change. +bilateral lower extremity redness, +right posterior thigh pain  Objective:   No results found. Recent Labs    03/12/21 1524  WBC 12.5*  HGB 7.7*  HCT 25.9*  PLT 237   Recent Labs    03/12/21 1524  NA 130*  K 3.9  CL 93*  CO2 27  GLUCOSE 174*  BUN 54*  CREATININE 5.58*  CALCIUM 8.8*    Intake/Output Summary (Last 24 hours) at 03/15/2021 1032 Last data filed at 03/14/2021 1833 Gross per 24 hour  Intake 480 ml  Output 100 ml  Net 380 ml        Physical Exam: Vital Signs Blood pressure 132/73, pulse 69, temperature (!) 97.5 F (36.4 C), resp. rate 16, height 5\' 4"  (1.626 m), weight (!) 171.3 kg, last menstrual period 07/17/2012, SpO2 94 %. Constitutional: No distress . Vital signs reviewed. HEENT: NCAT, EOMI, oral membranes moist, O2 2L Neck: supple Cardiovascular: IRR without murmur. No JVD    Respiratory/Chest: CTA Bilaterally without wheezes or rales. Normal effort    GI/Abdomen: BS +, non-tender, non-distended Ext: no clubbing, cyanosis, or edema Psych: pleasant and cooperative  Skin: RIJ permacath C/D/I, dry and scaly skin on bilateral feet, erythema of bilateral lower extremities, partial skin loss bilateral posterior thighs Neurological:     Comments: Patient is alert.  No acute distress.  Mood is a bit flat but appropriate.  Follows commands.  Oriented x3. Allodynia over lateral aspect of right foot- improved. Decreased sensation bilateral feet is still present   Assessment/Plan: 1. Functional deficits which  require 3+ hours per day of interdisciplinary therapy in a comprehensive inpatient rehab setting. Physiatrist is providing close team supervision and 24 hour management of active medical problems listed below. Physiatrist and rehab team continue to assess barriers to discharge/monitor patient progress toward functional and medical goals  Care Tool:  Bathing    Body parts bathed by patient: Right arm, Left arm, Chest, Abdomen, Right upper leg, Left upper leg, Right lower leg, Left lower leg, Face         Bathing assist Assist Level: Supervision/Verbal cueing     Upper Body Dressing/Undressing Upper body dressing   What is the patient wearing?: Hospital gown only    Upper body assist Assist Level: Independent    Lower Body Dressing/Undressing Lower body dressing      What is the patient wearing?: Pants, Underwear/pull up     Lower body assist Assist for lower body dressing: Supervision/Verbal cueing Assistive Device Comment: with reacher   Toileting Toileting Toileting Activity did not occur (Clothing management and hygiene only): N/A (no void or bm)  Toileting assist Assist for toileting: Supervision/Verbal cueing     Transfers Chair/bed transfer  Transfers assist  Chair/bed transfer activity did not occur: Refused (pt sleeps in chair)  Chair/bed transfer assist level: Independent with  assistive device Chair/bed transfer assistive device: Research officer, political party   Ambulation assist      Assist level: Independent with assistive device Assistive device: Cane-straight Max distance: 199ft   Walk 10 feet activity   Assist     Assist level: Independent with assistive device Assistive device: Cane-straight   Walk 50 feet activity   Assist Walk 50 feet with 2 turns activity did not occur: Safety/medical concerns (fatigue, R foot pain, weakness)  Assist level: Independent with assistive device Assistive device: Cane-straight    Walk 150 feet  activity   Assist Walk 150 feet activity did not occur: Safety/medical concerns (fatigue, R foot pain, weakness)  Assist level: Independent with assistive device Assistive device: Cane-straight    Walk 10 feet on uneven surface  activity   Assist Walk 10 feet on uneven surfaces activity did not occur: Safety/medical concerns (fatigue, R foot pain, weakness)   Assist level: Supervision/Verbal cueing Assistive device: Cane-straight   Wheelchair     Assist Is the patient using a wheelchair?: Yes Type of Wheelchair: Manual    Wheelchair assist level: Supervision/Verbal cueing Max wheelchair distance: 12ft    Wheelchair 50 feet with 2 turns activity    Assist        Assist Level: Dependent - Patient 0%   Wheelchair 150 feet activity     Assist      Assist Level: Dependent - Patient 0%   Blood pressure 132/73, pulse 69, temperature (!) 97.5 F (36.4 C), resp. rate 16, height 5\' 4"  (1.626 m), weight (!) 171.3 kg, last menstrual period 07/17/2012, SpO2 94 %.    Medical Problem List and Plan: 1. Functional deficits/debility secondary to acute on chronic renal failure superimposed with diastolic congestive heart failure             -patient may shower             -ELOS/Goals: 10-14 days modI             -Continue CIR therapies including PT, OT   HFU scheduled   [facility] referral sent to [facility]  Discussed discharge planning for tomorrow.  2.  Antithrombotics: -DVT/anticoagulation:  Pharmaceutical: Other (comment) Eliquis             -antiplatelet therapy: N/A 3. Right foot pain: Resolved. Tylenol as needed. D/c'ed Dilaudid,  -Added gabapentin HS, continue. Discussed that her pain and mobility have improved.  4. Insomnia: continue Gabapentin HS. Melatonin as needed. Increase prn trazodone to 75mg .              -antipsychotic agents: N/A 5. Neuropsych: This patient is capable of making decisions on her own behalf. 6. Skin/Wound Care: Routine skin checks 7.  Fluids/Electrolytes/Nutrition: Routine in and outs with follow-up chemistries 8.  End-stage renal disease.  Permacath placed 02/25/2021 hemodialysis initiated. Discussed recent creatinine level with her, creatinine uptrending, continue to monitor with HD labs.  9.  History of hematuria.  Left ureteral stone with recent uteroscopy/laser lithotripsy.  Follow-up urology Dr Diamantina [place] 10.  Hypothyroidism.  continue Synthroid 11.  Diabetes mellitus.  Hemoglobin A1c 7.2.  SSI. 12.  Hypothyroidism.  Continue Synthroid 13.  Hypertension.  Provided with list of foods that can help to lower blood pressure. Continue Clonidine 0.2 mg twice daily, Norvasc 5 mg twice daily.     1/28 bp well controlled 14.  Hyperlipidemia.  Continue Lipitor 15.  Atrial fibrillation/history of pulmonary emboli. Continue Eliquis.  Lopressor 100 mg twice daily 16.  Acute on  chronic anemia.  Transfuse 1 unit RBCs 03/04/2021.  Follow-up CBC 17.  Obesity.  BMI 62.17.  Dietary follow-up 18.  Chronic hypoxemic respiratory failure.  continue oxygen therapy as directed as well as CPAP. Weaned to 2L O2 19. Constipation: continue prn dulcolax.  20. Dry skin: order Eucerin cream.  21. Decreased urination: discussed likely due to fluid restriction/dialysis.  22. Bilateral lower extremity edema: continue fluid restriction, dialysis.   -elevate legs when possible 23. Bilateral lower extremity erythema: discussed plan for greater fluid to be removed next dialysis session 24. Bilateral posterior thigh irritant dermatitis: wound care consulted. Tramadol ordered prn for pain.    LOS: 11 days A FACE TO FACE EVALUATION WAS PERFORMED  Clide Deutscher Clara Herbison 03/15/2021, 10:32 AM

## 2021-03-15 NOTE — Progress Notes (Signed)
[facility] Progress Note   Subjective:   Reports she was short of breath during therapy and had to take a break. Denies SOB at rest. No CP, palpitations or dizziness.   Objective Vitals:   03/14/21 1530 03/14/21 2028 03/15/21 0606 03/15/21 0801  BP: 101/60 117/74 118/63 132/73  Pulse: 66 79 64 69  Resp: 14 16 16    Temp: 97.6 F (36.4 C) 98.2 F (36.8 C) (!) 97.5 F (36.4 C)   TempSrc:      SpO2: 97% 98% 94%   Weight:   (!) 171.3 kg   Height:       Physical Exam General: Alert female in NAD Heart: RRR, no murmur Lungs: CTA bilaterally without wheezing, rhonchi or rales. On O2 3L via [place] Abdomen: Soft, non-tender, +BS Extremities: 3+ pitting edema b/l lower extremities Dialysis Access: TDC with dry, intact bandage  Additional Objective Labs: Basic Metabolic Panel: Recent Labs  Lab 03/10/21 1949 03/12/21 1524  NA 130* 130*  K 3.6 3.9  CL 95* 93*  CO2 26 27  GLUCOSE 197* 174*  BUN 29* 54*  CREATININE 3.49* 5.58*  CALCIUM 7.9* 8.8*  PHOS 4.0 7.2*   Liver Function Tests: Recent Labs  Lab 03/10/21 1949 03/12/21 1524  ALBUMIN 2.5* 2.5*   No results for input(s): LIPASE, AMYLASE in the last 168 hours. CBC: Recent Labs  Lab 03/10/21 1949 03/12/21 1524  WBC 11.9* 12.5*  HGB 8.0* 7.7*  HCT 26.6* 25.9*  MCV 90.8 94.5  PLT 228 237   Blood Culture    Component Value Date/Time   SDES WOUND 09/30/2019 1504   SPECREQUEST Groin, Right 09/30/2019 1504   CULT ABUNDANT MORGANELLA MORGANII 09/30/2019 1504   REPTSTATUS 10/03/2019 FINAL 09/30/2019 1504    Cardiac Enzymes: No results for input(s): CKTOTAL, CKMB, CKMBINDEX, TROPONINI in the last 168 hours. CBG: Recent Labs  Lab 03/14/21 0558 03/14/21 1217 03/14/21 1657 03/14/21 2123 03/15/21 0620  GLUCAP 135* 118* 146* 135* 111*   Iron Studies: No results for input(s): IRON, TIBC, TRANSFERRIN, FERRITIN in the last 72 hours. @lablastinr3 @ Studies/Results: No results found. Medications:    amLODipine  5 mg Oral BID   apixaban  5 mg Oral BID   atorvastatin  20 mg Oral QHS   calcitRIOL  0.25 mcg Oral Daily   calcium carbonate  500 mg of elemental calcium Oral Q breakfast   Chlorhexidine Gluconate Cloth  6 each Topical Daily   Chlorhexidine Gluconate Cloth  6 each Topical Q0600   cloNIDine  0.2 mg Oral BID   darbepoetin (ARANESP) injection - DIALYSIS  60 mcg Intravenous Q Tue-HD   folic acid  9,937 mcg Oral Daily   gabapentin  300 mg Oral QHS   Gerhardt's butt cream   Topical TID   insulin aspart  0-9 Units Subcutaneous TID AC & HS   levothyroxine  112 mcg Oral QAC breakfast   metoprolol tartrate  100 mg Oral BID   sevelamer carbonate  800 mg Oral TID WC   Vitamin D (Ergocalciferol)  50,000 Units Oral Q Fri    Dialysis Orders: Accepted to [facility] TTS   Assessment/Plan: Acute Kidney Injury on chronic kidney disease stage IV: baseline creatinine of 2.43 in Nov 2022. Dialysis initiated on 02/25/21. Arranged for outpatient HD at [facility] on TTS schedule. Transferred from [facility] to CIR at [facility] for rehab.  No evidence of renal recovery. Dialysis here today and will plan for an extra dialysis tomorrow morning for further volume off  loading.  Hypertension w/ chronic kidney disease: On amlodipine, clonidine and metoprolol here. BP stable. Sig volume overload w/ diffuse LE edema. High UF goals w/ HD.Net UF 01/28 only 1.3 liters- unclear why, she says she slept through treatment. Will attempt higher UF goal tomorrow and will titrate down BP meds if she is unable to tolerate.  Anemia of chronic kidney disease: Hgb 7.5, s/p prbcs. Tsat 11 Ferritin 273 -Will replete. Ferrlicit 037V IV x4.  Aranesp 25 q week started 1/23. Increase dose. Checking labs with HD today.  Hematuria: Ureteral stent placed by urology on 02/14/2021.  With recent episode of nephrolithiasis.  No further reports of hematuria reported Secondary Hyperparathyroidism: Ca acceptable. On calcitriol. Phos 8.4  --Will start Renvela binder. Checking labs with HD today.  Acute respiratory failure: increased O2 needed initially from her baseline of 2 liters. Still vol overload.    Anice Paganini, PA-C 03/15/2021, 9:22 AM  [facility] Pager: [phone]

## 2021-03-15 NOTE — Progress Notes (Addendum)
Patient ID: CORISA MONTINI, female   DOB: 19-Mar-1965, 56 y.o.   MRN: [id number]  Sw waiting on [facility] follow up- declined due to insurance (OON) SW waiting on [facility] follow up- declined due to area Sw waiting on [facility] follow up- Patient declined due to insurance SW waiting on [facility] to follow up-Patient declined due to staffing    Patient declined by [facility] due to insurance Patient declined by [employer] [facility] due to insurance  Patient declined by [facility] due to insurance Patient declined by [facility] due to insurance Patient declined by [place] due to insurance Patient declined by [employer] due to insurance  Sw waiting on follow up call from [facility]- declined due to staffing Patient paper referral sent to [place] Referral sent to [facility]- declined due to staffing Referral sent to [facility]- Patient declined due to insurance

## 2021-03-15 NOTE — Progress Notes (Signed)
8022: Second call placed to P. Love - PA regarding pain control. Reported to P. Love -PA  that patient is requesting pain medication due to wound/excoriation to posterior thigh. Provider encourages offloading pressure through periods of standing as well as ordering an air mattress for patient to sleep in. Patient declined these interventions stating that she sleeps in chair at home. Education provided. New order for Tramadol 50 mg one time only placed per provider.

## 2021-03-15 NOTE — Progress Notes (Signed)
Physical Therapy Discharge Summary  Patient Details  Name: Nicole Schmidt MRN: [id number] Date of Birth: May 15, 1965  Today's Date: 03/15/2021 PT Individual Time: 0800-0910 PT Individual Time Calculation (min): 70 min   Patient has met 8 of 9 long term goals due to improved activity tolerance, improved balance, improved postural control, increased strength, decreased pain, and improved coordination.  Patient to discharge at an ambulatory level Modified Independent. Pt will have intermittent assist from friends/neighbors upon discharge.   Reasons goals not met: Pt did not meet car transfer goal of supervision as pt continues to require min A due to body habitus, discomfort on buttocks, and decreased ROM in BLE as a result of edema.   Recommendation:  Patient will benefit from ongoing skilled PT services in home health setting to continue to advance safe functional mobility, address ongoing impairments in generalized strengthening and endurance, dynamic standing balance/coordination, gait training, and to minimize fall risk.  Equipment: No equipment provided - pt already has [place] and home O2  Reasons for discharge: treatment goals met  Patient/family agrees with progress made and goals achieved: Yes  Today's Interventions: Received pt sitting in recliner with RN present at bedside. Pt agreeable to PT treatment and denied any pain at rest but increasing discomfort in buttocks throughout session. Pt also reported not sleeping well last night from discomfort on buttocks but per RN - wound healing well. Session with emphasis on discharge planning, functional mobility/transfers, generalized strengthening, dynamic standing balance/coordination, gait training, stair navigation, and improved activity tolerance. Donned pants sitting with max A and stood mod I to pull pants over hips. Pt performed all transfers Mod I with SPC throughout session. Pt on 2L O2 via [place] with sats >90% but difficulty determining  accuracy of pulse oximeter; therefore monitored based on symptoms. Pt transported to/from room in [facility] dependently for time management purposes. Pt navigated 4 steps 2 rails with close supervision ascending and descending with a step to pattern. Pt politely declined practicing simulated car transfer again due to fatigue. In dayroom, pt ambulated 67f x 1 and 375fx 1 with SPC mod I with 2 extensive standing rest breaks - limited by pain, SOB, and edema in BLEs this morning. MD present for morning rounds to discuss D/C planning and medications. Pt then performed the following exercises sitting [facility] supervision and verbal cues for technique: -horizontal chest press with 3lb dowel 2x12 -overhead chest press with 3lb dowel 2x12 -bicep curls with 6lb dowel 2x15 Concluded session with pt sitting in recliner with all needs within reach.  PT Discharge Precautions/Restrictions Precautions Precautions: Fall Precaution Comments: 2L O2 Restrictions Weight Bearing Restrictions: No Pain Interference Pain Interference Pain Effect on Sleep: 1. Rarely or not at all Pain Interference with Therapy Activities: 1. Rarely or not at all Pain Interference with Day-to-Day Activities: 1. Rarely or not at all Cognition Overall Cognitive Status: Within Functional Limits for tasks assessed Arousal/Alertness: Awake/alert Orientation Level: Oriented X4 Memory: Appears intact Awareness: Appears intact Problem Solving: Appears intact Safety/Judgment: Appears intact Sensation Sensation Light Touch: Appears Intact Proprioception: Appears Intact Coordination Gross Motor Movements are Fluid and Coordinated: Yes Fine Motor Movements are Fluid and Coordinated: Yes Coordination and Movement Description: generalized weakness/deconditioning Finger Nose Finger Test: WFSouthwest Healthcare Servicesilaterally Heel Shin Test: decreased ROM bilaterally due to body habitus Motor  Motor Motor: Within Functional Limits Motor - Skilled Clinical  Observations: generalized weakness/deconditioning  Mobility Bed Mobility Bed Mobility: Rolling Right;Rolling Left;Sit to Supine;Supine to Sit Rolling Right: Independent with assistive  device Rolling Left: Independent with assistive device Supine to Sit: Independent with assistive device Sit to Supine: Independent with assistive device Transfers Transfers: Sit to Stand;Stand to Sit;Stand Pivot Transfers Sit to Stand: Independent with assistive device Stand to Sit: Independent with assistive device Stand Pivot Transfers: Independent with assistive device Transfer (Assistive device): Straight cane Locomotion  Gait Ambulation: Yes Gait Assistance: Independent with assistive device Gait Distance (Feet): 180 Feet Assistive device: Straight cane Gait Gait: Yes Gait Pattern: Impaired Gait Pattern: Decreased step length - right;Decreased step length - left;Decreased stride length;Poor foot clearance - left;Poor foot clearance - right;Wide base of support;Step-through pattern Gait velocity: decreased Stairs / Additional Locomotion Stairs: Yes Stairs Assistance: Supervision/Verbal cueing Stair Management Technique: Two rails Number of Stairs: 4 Height of Stairs: 6 Ramp: Supervision/Verbal cueing (SPC) Pick up small object from the floor assist level: Supervision/Verbal cueing Pick up small object from the floor assistive device: pants from floor without AD Wheelchair Mobility Wheelchair Mobility: Yes Wheelchair Assistance: Supervision/Verbal cueing Wheelchair Propulsion: Both upper extremities Wheelchair Parts Management: Needs assistance Distance: 14f  Trunk/Postural Assessment  Cervical Assessment Cervical Assessment: Exceptions to WAlamarcon Holding LLC(forward head) Thoracic Assessment Thoracic Assessment: Exceptions to WH[facility] Health(mild kyphosis) Lumbar Assessment Lumbar Assessment: Exceptions to W[facility](anterior pelvic tilt due to body habitus) Postural Control Postural Control: Within Functional  Limits  Balance Balance Balance Assessed: Yes Static Sitting Balance Static Sitting - Balance Support: Feet supported;Bilateral upper extremity supported Static Sitting - Level of Assistance: 7: Independent Dynamic Sitting Balance Dynamic Sitting - Balance Support: Feet supported;No upper extremity supported Dynamic Sitting - Level of Assistance: 6: Modified independent (Device/Increase time) Static Standing Balance Static Standing - Balance Support: Right upper extremity supported (SPC) Static Standing - Level of Assistance: 6: Modified independent (Device/Increase time) Dynamic Standing Balance Dynamic Standing - Balance Support: Right upper extremity supported (SPC) Dynamic Standing - Level of Assistance: 6: Modified independent (Device/Increase time) Extremity Assessment  RLE Assessment RLE Assessment: Exceptions to WMem[facility] HospitalGeneral Strength Comments: limited ROM due to body habitus and edema RLE Strength Right Hip Flexion: 4-/5 Right Hip ABduction: 4-/5 Right Hip ADduction: 4-/5 Right Knee Flexion: 4-/5 Right Knee Extension: 4-/5 Right Ankle Dorsiflexion: 4-/5 Right Ankle Plantar Flexion: 4-/5 LLE Assessment LLE Assessment: Exceptions to WPuget [facility]land[facility] Endo CtrGeneral Strength Comments: limited ROM due to body habitus and edema LLE Strength Left Hip Flexion: 4-/5 Left Hip ABduction: 4-/5 Left Hip ADduction: 4-/5 Left Knee Flexion: 4-/5 Left Knee Extension: 4-/5 Left Ankle Dorsiflexion: 4-/5 Left Ankle Plantar Flexion: 4-/5  AAlfonse AlpersPT, DPT  03/15/2021, 7:21 AM

## 2021-03-15 NOTE — Progress Notes (Signed)
Occupational Therapy Session Note  Patient Details  Name: Nicole Schmidt MRN: [id number] Date of Birth: [birth year]  Today's Date: 03/15/2021 OT Individual Time: 9480-1655 OT Individual Time Calculation (min): 70 min    Short Term Goals: Week 1:  OT Short Term Goal 1 (Week 1): STG = LTG 2/2 ELOS  Skilled Therapeutic Interventions/Progress Updates:  Skilled OT intervention completed with focus on self-care, functional endurance and ambulation, d/c planning, discussion of rehab goals and POC. Pt received seated in recliner, agreeable to session and requesting to wash her hair. Pt with [place] on, on 2L O2, with pt remaining on throughout session, ranging from 88%-92% sat with pt requiring pursed lip breathing to increase sat >90 during ambulation. Pt completed ambulatory transfer with mod I to w/c, with pt backed up to sink for hair washing at sink with shower tray due to pt's HD port restricting her full showering capabilities. Therapist washed pt's hair with total A, with pt able to tolerate head tilt back without feeling SOB. Pt completed hair drying/brushing seated facing mirror for BUE endurance, with mod I. Grooming tasks completed in standing at sink with mod I. Pt functionally ambulated with SPC and intermittent assist on L wall rail, with mod I to promote functional endurance. Pt tolerating initially only about 20 ft at a time with intermittent standing rest breaks due to fatigue, however pt set a visual goal to end the session and walked with mod I a total of 50 ft. Pt transported with total A in w/c back to room. Then was left seated in recliner, with next OT in room and direct care handed off to next therapist.   Therapy Documentation Precautions:  Precautions Precautions: Fall Precaution Comments: 2L O2 Restrictions Weight Bearing Restrictions: No RLE Weight Bearing: Weight bearing as tolerated LLE Weight Bearing: Weight bearing as tolerated  Pain: No pain, however requested therapist  to ask RN for meds for preventative pain therapy before PT session and dialysis- RN administered in session.   Therapy/Group: Individual Therapy  Aerionna Moravek E Vincent Ehrler 03/15/2021, 7:50 AM

## 2021-03-15 NOTE — Progress Notes (Signed)
Contacted [facility] and spoke to [employer]. Clinic advised that pt will d/c to home tomorrow and will start at the clinic on Thursday. Clinic is requesting d/c summary be faxed for continuation of care. Will fax summary tomorrow once available. HD information added to pt's AVS. Met with pt at bedside and provided information sheet with details noted as well. Pt voices understanding of arrangements and aware she needs to arrive at 10:15 on Thursday to complete paperwork prior to 10:45 chair time.   Melven Sartorius Renal Navigator [phone]

## 2021-03-15 NOTE — Progress Notes (Signed)
Patient reports 10/10 pain to posterior thigh. Redness and excoriation visible. Gerhardt cream applied per orders. Patient denies inner dry at this time as she states it contributes to discomfort. PRN tylenol administered at 0018 per patient request. 0215: Patient reports minimal relief following tylenol. Patient requested a stronger pain medication as she states it has helped with her pain in the past. Call placed to P. Love - PA at 0218. Awaiting return phone call at this time. Will attempt distraction/repositioning for additional pain relief measures.

## 2021-03-16 ENCOUNTER — Other Ambulatory Visit (HOSPITAL_COMMUNITY): Payer: Self-pay

## 2021-03-16 LAB — GLUCOSE, CAPILLARY
Glucose-Capillary: 97 mg/dL (ref 70–99)
Glucose-Capillary: 100 mg/dL — ABNORMAL HIGH (ref 70–99)

## 2021-03-16 LAB — RENAL FUNCTION PANEL
Albumin: 2.7 g/dL — ABNORMAL LOW (ref 3.5–5.0)
Anion gap: 9 (ref 5–15)
BUN: 38 mg/dL — ABNORMAL HIGH (ref 6–20)
CO2: 26 mmol/L (ref 22–32)
Calcium: 8.2 mg/dL — ABNORMAL LOW (ref 8.9–10.3)
Chloride: 97 mmol/L — ABNORMAL LOW (ref 98–111)
Creatinine, Ser: 4.5 mg/dL — ABNORMAL HIGH (ref 0.44–1.00)
GFR, Estimated: 11 mL/min — ABNORMAL LOW (ref >60)
Glucose, Bld: 152 mg/dL — ABNORMAL HIGH (ref 70–99)
Phosphorus: 4.6 mg/dL (ref 2.5–4.6)
Potassium: 4.5 mmol/L (ref 3.5–5.1)
Sodium: 132 mmol/L — ABNORMAL LOW (ref 135–145)

## 2021-03-16 LAB — CBC
HCT: 25.8 % — ABNORMAL LOW (ref 36.0–46.0)
Hemoglobin: 7.2 g/dL — ABNORMAL LOW (ref 12.0–15.0)
MCH: 27.2 pg (ref 26.0–34.0)
MCHC: 27.9 g/dL — ABNORMAL LOW (ref 30.0–36.0)
MCV: 97.4 fL (ref 80.0–100.0)
Platelets: 248 10*3/uL (ref 150–400)
RBC: 2.65 MIL/uL — ABNORMAL LOW (ref 3.87–5.11)
RDW: 16.4 % — ABNORMAL HIGH (ref 11.5–15.5)
WBC: 10.9 10*3/uL — ABNORMAL HIGH (ref 4.0–10.5)
nRBC: 1.2 % — ABNORMAL HIGH (ref 0.0–0.2)

## 2021-03-16 MED ORDER — AMLODIPINE BESYLATE 5 MG PO TABS
5.0000 mg | ORAL_TABLET | Freq: Every day | ORAL | 0 refills | Status: DC
  Filled 2021-03-16: qty 60, 60d supply, fill #0

## 2021-03-16 MED ORDER — AMLODIPINE BESYLATE 5 MG PO TABS: 5.0000 mg | ORAL_TABLET | Freq: Every day | ORAL | Status: DC

## 2021-03-16 MED ORDER — ALBUMIN HUMAN 25 % IV SOLN
25.0000 g | Freq: Once | INTRAVENOUS | Status: AC
  Administered 2021-03-16: 25 g via INTRAVENOUS

## 2021-03-16 NOTE — Progress Notes (Signed)
PROGRESS NOTE   Subjective/Complaints: D/c today Discussed with therapy patient's desire to stay longer but she has met therapy goals at this time Received additional HD session today  ROS: Patient denies fever, rash, sore throat, blurred vision, dizziness, nausea, vomiting, diarrhea, cough, shortness of breath or chest pain, joint or back/neck pain, headache, or mood change. +bilateral lower extremity redness, +right posterior thigh pain  Objective:   No results found. Recent Labs    03/15/21 1454 03/16/21 0943  WBC 10.9* 10.9*  HGB 7.4* 7.2*  HCT 25.7* 25.8*  PLT 248 248   Recent Labs    03/16/21 0942  NA 132*  K 4.5  CL 97*  CO2 26  GLUCOSE 152*  BUN 38*  CREATININE 4.50*  CALCIUM 8.2*    Intake/Output Summary (Last 24 hours) at 03/16/2021 1316 Last data filed at 03/16/2021 1206 Gross per 24 hour  Intake --  Output 3929 ml  Net -3929 ml        Physical Exam: Vital Signs Blood pressure (!) 95/46, pulse 71, temperature 98 F (36.7 C), temperature source Oral, resp. rate 18, height '5\' 4"'  (1.626 m), weight (!) 169 kg, last menstrual period 07/17/2012, SpO2 93 %. Gen: no distress, normal appearing HEENT: oral mucosa pink and moist, NCAT Cardio: Reg rate Chest: normal effort, normal rate of breathing Abd: soft, non-distended Ext: bilateral lower extremity edema Psych: pleasant, normal affect  Skin: RIJ permacath C/D/I, dry and scaly skin on bilateral feet, erythema of bilateral lower extremities, partial skin loss bilateral posterior thighs Neurological:     Comments: Patient is alert.  No acute distress.  Mood is a bit flat but appropriate.  Follows commands.  Oriented x3. Allodynia over lateral aspect of right foot- improved. Decreased sensation bilateral feet is still present   Assessment/Plan: 1. Functional deficits which require 3+ hours per day of interdisciplinary therapy in a comprehensive  inpatient rehab setting. Physiatrist is providing close team supervision and 24 hour management of active medical problems listed below. Physiatrist and rehab team continue to assess barriers to discharge/monitor patient progress toward functional and medical goals  Care Tool:  Bathing    Body parts bathed by patient: Right arm, Left arm, Chest, Abdomen, Right upper leg, Left upper leg, Right lower leg, Left lower leg, Face, Front perineal area, Buttocks         Bathing assist Assist Level: Independent with assistive device Assistive Device Comment: Long handled sponge, seated bathing   Upper Body Dressing/Undressing Upper body dressing   What is the patient wearing?: Pull over shirt    Upper body assist Assist Level: Independent    Lower Body Dressing/Undressing Lower body dressing      What is the patient wearing?: Pants     Lower body assist Assist for lower body dressing: Independent with assitive device Assistive Device Comment: with reacher   Toileting Toileting Toileting Activity did not occur (Clothing management and hygiene only): N/A (no void or bm)  Toileting assist Assist for toileting: Independent with assistive device     Transfers Chair/bed transfer  Transfers assist  Chair/bed transfer activity did not occur: Refused (pt sleeps in chair)  Chair/bed transfer assist level:  Independent with assistive device Chair/bed transfer assistive device: Research officer, political party   Ambulation assist      Assist level: Independent with assistive device Assistive device: Cane-straight Max distance: 175f   Walk 10 feet activity   Assist     Assist level: Independent with assistive device Assistive device: Cane-straight   Walk 50 feet activity   Assist Walk 50 feet with 2 turns activity did not occur: Safety/medical concerns (fatigue, R foot pain, weakness)  Assist level: Independent with assistive device Assistive device: Cane-straight     Walk 150 feet activity   Assist Walk 150 feet activity did not occur: Safety/medical concerns (fatigue, R foot pain, weakness)  Assist level: Independent with assistive device Assistive device: Cane-straight    Walk 10 feet on uneven surface  activity   Assist Walk 10 feet on uneven surfaces activity did not occur: Safety/medical concerns (fatigue, R foot pain, weakness)   Assist level: Supervision/Verbal cueing Assistive device: Cane-straight   Wheelchair     Assist Is the patient using a wheelchair?: Yes Type of Wheelchair: Manual    Wheelchair assist level: Supervision/Verbal cueing Max wheelchair distance: 275f   Wheelchair 50 feet with 2 turns activity    Assist        Assist Level: Dependent - Patient 0%   Wheelchair 150 feet activity     Assist      Assist Level: Dependent - Patient 0%   Blood pressure (!) 95/46, pulse 71, temperature 98 F (36.7 C), temperature source Oral, resp. rate 18, height '5\' 4"'  (1.626 m), weight (!) 169 kg, last menstrual period 07/17/2012, SpO2 93 %.    Medical Problem List and Plan: 1. Functional deficits/debility secondary to acute on chronic renal failure superimposed with diastolic congestive heart failure             -patient may shower             -ELOS/Goals: 10-14 days modI             -d/c home today 2.  Antithrombotics: -DVT/anticoagulation:  Pharmaceutical: Other (comment) Eliquis             -antiplatelet therapy: N/A 3. Right foot pain: Resolved. Tylenol as needed. D/c'ed Dilaudid,  -Added gabapentin HS, continue. Discussed that her pain and mobility have improved.  4. Insomnia: continue Gabapentin HS. Melatonin as needed. Increase prn trazodone to 753m             -antipsychotic agents: N/A 5. Neuropsych: This patient is capable of making decisions on her own behalf. 6. Skin/Wound Care: Routine skin checks 7. Fluids/Electrolytes/Nutrition: Routine in and outs with follow-up chemistries 8.   End-stage renal disease.  Permacath placed 02/25/2021 hemodialysis initiated. Discussed recent creatinine level with her, creatinine reviewed and doowntrending, continue to monitor with HD labs.  9.  History of hematuria.  Left ureteral stone with recent uteroscopy/laser lithotripsy.  Follow-up urology Dr SniDiamantina [place].  Hypothyroidism.  continue Synthroid 11.  Diabetes mellitus.  Hemoglobin A1c 7.2.  SSI. 12.  Hypothyroidism.  Continue Synthroid 13.  Hypertension.  Provided with list of foods that can help to lower blood pressure. Continue Clonidine 0.2 mg twice daily, Norvasc 5 mg twice daily.     1/28 bp well controlled 14.  Hyperlipidemia.  Continue Lipitor 15.  Atrial fibrillation/history of pulmonary emboli. Continue Eliquis.  Lopressor 100 mg twice daily 16.  Acute on chronic anemia.  Transfuse 1 unit RBCs 03/04/2021.  Follow-up CBC 17.  Obesity.  BMI 62.17.  Dietary follow-up 18.  Chronic hypoxemic respiratory failure.  continue oxygen therapy as directed as well as CPAP. Weaned to 2L O2 19. Constipation: continue prn dulcolax.  20. Dry skin: order Eucerin cream.  21. Decreased urination: discussed likely due to fluid restriction/dialysis.  22. Bilateral lower extremity edema: continue fluid restriction, dialysis.   -elevate legs when possible 23. Bilateral lower extremity erythema: discussed plan for greater fluid to be removed next dialysis session 24. Bilateral posterior thigh irritant dermatitis: wound care consulted. Tramadol ordered prn for pain.    LOS: 12 days A FACE TO FACE EVALUATION WAS PERFORMED  Meridith Romick P Heiley Shaikh 03/16/2021, 1:16 PM

## 2021-03-16 NOTE — Progress Notes (Signed)
Pt has home CPAP. Pt stated she will place CPAP on herself.

## 2021-03-16 NOTE — Procedures (Signed)
Patient seen on Hemodialysis. BP (!) 73/36 (BP Location: Left Arm)    Pulse (!) 59    Temp 98 F (36.7 C) (Oral)    Resp 18    Ht 5\' 4"  (1.626 m)    Wt (!) 169 kg    LMP 07/17/2012    SpO2 93%    BMI 63.97 kg/m   QB 400, UF goal 2.5L Tolerating treatment without complaints at this time.   Elmarie Shiley MD [facility]. Office # [phone] Pager # [phone] 11:14 AM

## 2021-03-16 NOTE — Progress Notes (Signed)
[facility] Progress Note   Subjective:   Tolerated 3.5L UF yesterday but remains volume overloaded. Reports no SOB at rest but ongoing dyspnea. Denies CP, palpitations, and dizziness. Discharging today but we will try to arrange for short HD this AM before she leaves.   Objective Vitals:   03/16/21 0334 03/16/21 0437 03/16/21 0500 03/16/21 0749  BP: (!) 119/57 (!) 127/55  (!) 118/56  Pulse: 64 70  75  Resp: 14 18    Temp: 97.7 F (36.5 C) 97.7 F (36.5 C)    TempSrc:  Oral    SpO2: 91% 93%    Weight:   (!) 169 kg   Height:       Physical Exam General: Alert female in NAD Heart: RRR, no murmur Lungs: CTA bilaterally without wheezing, rhonchi or rales. On O2 3L via [place] Abdomen: Soft, non-tender, +BS Extremities: 3+ pitting edema b/l lower extremities Dialysis Access: TDC with dry, intact bandage    Additional Objective Labs: Basic Metabolic Panel: Recent Labs  Lab 03/10/21 1949 03/12/21 1524  NA 130* 130*  K 3.6 3.9  CL 95* 93*  CO2 26 27  GLUCOSE 197* 174*  BUN 29* 54*  CREATININE 3.49* 5.58*  CALCIUM 7.9* 8.8*  PHOS 4.0 7.2*   Liver Function Tests: Recent Labs  Lab 03/10/21 1949 03/12/21 1524  ALBUMIN 2.5* 2.5*   No results for input(s): LIPASE, AMYLASE in the last 168 hours. CBC: Recent Labs  Lab 03/10/21 1949 03/12/21 1524 03/15/21 1454  WBC 11.9* 12.5* 10.9*  HGB 8.0* 7.7* 7.4*  HCT 26.6* 25.9* 25.7*  MCV 90.8 94.5 96.6  PLT 228 237 248   Blood Culture    Component Value Date/Time   SDES WOUND 09/30/2019 1504   SPECREQUEST Groin, Right 09/30/2019 1504   CULT ABUNDANT MORGANELLA MORGANII 09/30/2019 1504   REPTSTATUS 10/03/2019 FINAL 09/30/2019 1504    Cardiac Enzymes: No results for input(s): CKTOTAL, CKMB, CKMBINDEX, TROPONINI in the last 168 hours. CBG: Recent Labs  Lab 03/15/21 0620 03/15/21 1153 03/15/21 1834 03/15/21 2102 03/16/21 0554  GLUCAP 111* 125* 126* 160* 97   Iron Studies: No results for input(s):  IRON, TIBC, TRANSFERRIN, FERRITIN in the last 72 hours. @lablastinr3 @ Studies/Results: No results found. Medications:   amLODipine  5 mg Oral BID   apixaban  5 mg Oral BID   atorvastatin  20 mg Oral QHS   calcitRIOL  0.25 mcg Oral Daily   calcium carbonate  500 mg of elemental calcium Oral Q breakfast   Chlorhexidine Gluconate Cloth  6 each Topical Daily   Chlorhexidine Gluconate Cloth  6 each Topical Q0600   Chlorhexidine Gluconate Cloth  6 each Topical Q0600   cloNIDine  0.2 mg Oral BID   darbepoetin (ARANESP) injection - DIALYSIS  60 mcg Intravenous Q Tue-HD   folic acid  8,502 mcg Oral Daily   gabapentin  300 mg Oral QHS   Gerhardt's butt cream   Topical TID   insulin aspart  0-9 Units Subcutaneous TID AC & HS   levothyroxine  112 mcg Oral QAC breakfast   metoprolol tartrate  100 mg Oral BID   sevelamer carbonate  800 mg Oral TID WC   Vitamin D (Ergocalciferol)  50,000 Units Oral Q Fri    Dialysis Orders: Accepted to [facility] TTS   Assessment/Plan: Acute Kidney Injury on chronic kidney disease stage IV: baseline creatinine of 2.43 in Nov 2022. Dialysis initiated on 02/25/21. Arranged for outpatient HD at [facility] on TTS  schedule. Transferred from [facility] to CIR at [facility] for rehab.  No evidence of renal recovery. Dialysis here today and will plan for an extra dialysis this morning for further volume off loading.  Hypertension w/ chronic kidney disease: On amlodipine, clonidine and metoprolol here. BP stable. Sig volume overload w/ diffuse LE edema. High UF goals w/ HD- tolerated 3.5 hours yesterday. Continue to titrate down volume as tolerated.   Anemia of chronic kidney disease: Hgb 7.5, s/p prbcs. Tsat 11 Ferritin 273 -Will replete. Ferrlicit 952W IV x4.  Aranesp 25 q week started 1/23. Increase dose. Checking labs with HD today.  Hematuria: Ureteral stent placed by urology on 02/14/2021.  With recent episode of nephrolithiasis.  No further reports of hematuria  reported Secondary Hyperparathyroidism: Ca acceptable. On calcitriol. Phos 8.4 --started Renvela binder. Checking labs with HD today.  Acute respiratory failure: increased O2 needed initially from her baseline of 2 liters. Still vol overload.    Anice Paganini, PA-C 03/16/2021, 8:44 AM  [facility] Pager: [phone]

## 2021-03-17 ENCOUNTER — Encounter: Payer: Self-pay | Admitting: Emergency Medicine

## 2021-03-17 ENCOUNTER — Other Ambulatory Visit: Payer: Self-pay

## 2021-03-17 ENCOUNTER — Inpatient Hospital Stay
Admission: EM | Admit: 2021-03-17 | Discharge: 2021-03-28 | DRG: 291 | Disposition: A | Discharge status: Rehabilitation Facility | Payer: BC Managed Care – PPO | Source: Ambulatory Visit | Attending: Internal Medicine | Admitting: Internal Medicine

## 2021-03-17 DIAGNOSIS — Z8701 Personal history of pneumonia (recurrent): Secondary | ICD-10-CM

## 2021-03-17 DIAGNOSIS — N179 Acute kidney failure, unspecified: Secondary | ICD-10-CM | POA: Diagnosis not present

## 2021-03-17 DIAGNOSIS — J811 Chronic pulmonary edema: Secondary | ICD-10-CM | POA: Diagnosis not present

## 2021-03-17 DIAGNOSIS — M109 Gout, unspecified: Secondary | ICD-10-CM | POA: Diagnosis present

## 2021-03-17 DIAGNOSIS — I959 Hypotension, unspecified: Secondary | ICD-10-CM | POA: Diagnosis present

## 2021-03-17 DIAGNOSIS — I2510 Atherosclerotic heart disease of native coronary artery without angina pectoris: Secondary | ICD-10-CM | POA: Diagnosis present

## 2021-03-17 DIAGNOSIS — E8770 Fluid overload, unspecified: Secondary | ICD-10-CM | POA: Diagnosis not present

## 2021-03-17 DIAGNOSIS — Z7901 Long term (current) use of anticoagulants: Secondary | ICD-10-CM

## 2021-03-17 DIAGNOSIS — G9340 Encephalopathy, unspecified: Secondary | ICD-10-CM | POA: Diagnosis present

## 2021-03-17 DIAGNOSIS — J9621 Acute and chronic respiratory failure with hypoxia: Secondary | ICD-10-CM | POA: Diagnosis not present

## 2021-03-17 DIAGNOSIS — I890 Lymphedema, not elsewhere classified: Secondary | ICD-10-CM | POA: Diagnosis present

## 2021-03-17 DIAGNOSIS — I132 Hypertensive heart and chronic kidney disease with heart failure and with stage 5 chronic kidney disease, or end stage renal disease: Secondary | ICD-10-CM | POA: Diagnosis not present

## 2021-03-17 DIAGNOSIS — E1122 Type 2 diabetes mellitus with diabetic chronic kidney disease: Secondary | ICD-10-CM | POA: Diagnosis present

## 2021-03-17 DIAGNOSIS — J9601 Acute respiratory failure with hypoxia: Secondary | ICD-10-CM | POA: Diagnosis not present

## 2021-03-17 DIAGNOSIS — Z20822 Contact with and (suspected) exposure to covid-19: Secondary | ICD-10-CM | POA: Diagnosis present

## 2021-03-17 DIAGNOSIS — N136 Pyonephrosis: Secondary | ICD-10-CM | POA: Diagnosis present

## 2021-03-17 DIAGNOSIS — D631 Anemia in chronic kidney disease: Secondary | ICD-10-CM | POA: Diagnosis present

## 2021-03-17 DIAGNOSIS — I2609 Other pulmonary embolism with acute cor pulmonale: Secondary | ICD-10-CM | POA: Diagnosis not present

## 2021-03-17 DIAGNOSIS — E875 Hyperkalemia: Secondary | ICD-10-CM | POA: Diagnosis present

## 2021-03-17 DIAGNOSIS — Z9115 Patient's noncompliance with renal dialysis: Secondary | ICD-10-CM

## 2021-03-17 DIAGNOSIS — I509 Heart failure, unspecified: Secondary | ICD-10-CM | POA: Diagnosis not present

## 2021-03-17 DIAGNOSIS — E785 Hyperlipidemia, unspecified: Secondary | ICD-10-CM | POA: Diagnosis present

## 2021-03-17 DIAGNOSIS — E669 Obesity, unspecified: Secondary | ICD-10-CM | POA: Diagnosis not present

## 2021-03-17 DIAGNOSIS — I129 Hypertensive chronic kidney disease with stage 1 through stage 4 chronic kidney disease, or unspecified chronic kidney disease: Secondary | ICD-10-CM | POA: Diagnosis not present

## 2021-03-17 DIAGNOSIS — R0602 Shortness of breath: Secondary | ICD-10-CM | POA: Diagnosis not present

## 2021-03-17 DIAGNOSIS — Z8249 Family history of ischemic heart disease and other diseases of the circulatory system: Secondary | ICD-10-CM

## 2021-03-17 DIAGNOSIS — I517 Cardiomegaly: Secondary | ICD-10-CM | POA: Diagnosis not present

## 2021-03-17 DIAGNOSIS — Z1612 Extended spectrum beta lactamase (ESBL) resistance: Secondary | ICD-10-CM | POA: Diagnosis present

## 2021-03-17 DIAGNOSIS — E038 Other specified hypothyroidism: Secondary | ICD-10-CM | POA: Diagnosis not present

## 2021-03-17 DIAGNOSIS — J9690 Respiratory failure, unspecified, unspecified whether with hypoxia or hypercapnia: Secondary | ICD-10-CM | POA: Diagnosis not present

## 2021-03-17 DIAGNOSIS — N390 Urinary tract infection, site not specified: Secondary | ICD-10-CM | POA: Diagnosis not present

## 2021-03-17 DIAGNOSIS — F32A Depression, unspecified: Secondary | ICD-10-CM | POA: Diagnosis present

## 2021-03-17 DIAGNOSIS — J9602 Acute respiratory failure with hypercapnia: Secondary | ICD-10-CM | POA: Diagnosis not present

## 2021-03-17 DIAGNOSIS — E039 Hypothyroidism, unspecified: Secondary | ICD-10-CM | POA: Diagnosis present

## 2021-03-17 DIAGNOSIS — Z79899 Other long term (current) drug therapy: Secondary | ICD-10-CM

## 2021-03-17 DIAGNOSIS — G4733 Obstructive sleep apnea (adult) (pediatric): Secondary | ICD-10-CM | POA: Diagnosis not present

## 2021-03-17 DIAGNOSIS — Z833 Family history of diabetes mellitus: Secondary | ICD-10-CM

## 2021-03-17 DIAGNOSIS — E1151 Type 2 diabetes mellitus with diabetic peripheral angiopathy without gangrene: Secondary | ICD-10-CM | POA: Diagnosis present

## 2021-03-17 DIAGNOSIS — N1330 Unspecified hydronephrosis: Secondary | ICD-10-CM

## 2021-03-17 DIAGNOSIS — R609 Edema, unspecified: Secondary | ICD-10-CM | POA: Diagnosis not present

## 2021-03-17 DIAGNOSIS — I120 Hypertensive chronic kidney disease with stage 5 chronic kidney disease or end stage renal disease: Secondary | ICD-10-CM | POA: Diagnosis not present

## 2021-03-17 DIAGNOSIS — R262 Difficulty in walking, not elsewhere classified: Secondary | ICD-10-CM

## 2021-03-17 DIAGNOSIS — J9811 Atelectasis: Secondary | ICD-10-CM

## 2021-03-17 DIAGNOSIS — E8729 Other acidosis: Secondary | ICD-10-CM | POA: Diagnosis present

## 2021-03-17 DIAGNOSIS — R5381 Other malaise: Secondary | ICD-10-CM | POA: Diagnosis present

## 2021-03-17 DIAGNOSIS — M1990 Unspecified osteoarthritis, unspecified site: Secondary | ICD-10-CM | POA: Diagnosis present

## 2021-03-17 DIAGNOSIS — J9611 Chronic respiratory failure with hypoxia: Secondary | ICD-10-CM | POA: Diagnosis present

## 2021-03-17 DIAGNOSIS — E1169 Type 2 diabetes mellitus with other specified complication: Secondary | ICD-10-CM | POA: Diagnosis present

## 2021-03-17 DIAGNOSIS — L89152 Pressure ulcer of sacral region, stage 2: Secondary | ICD-10-CM | POA: Diagnosis not present

## 2021-03-17 DIAGNOSIS — E871 Hypo-osmolality and hyponatremia: Secondary | ICD-10-CM | POA: Diagnosis present

## 2021-03-17 DIAGNOSIS — Z79890 Hormone replacement therapy (postmenopausal): Secondary | ICD-10-CM

## 2021-03-17 DIAGNOSIS — N186 End stage renal disease: Secondary | ICD-10-CM | POA: Diagnosis not present

## 2021-03-17 DIAGNOSIS — N2581 Secondary hyperparathyroidism of renal origin: Secondary | ICD-10-CM | POA: Diagnosis present

## 2021-03-17 DIAGNOSIS — Z853 Personal history of malignant neoplasm of breast: Secondary | ICD-10-CM

## 2021-03-17 DIAGNOSIS — I5031 Acute diastolic (congestive) heart failure: Secondary | ICD-10-CM | POA: Diagnosis not present

## 2021-03-17 DIAGNOSIS — I5032 Chronic diastolic (congestive) heart failure: Secondary | ICD-10-CM | POA: Diagnosis not present

## 2021-03-17 DIAGNOSIS — J9622 Acute and chronic respiratory failure with hypercapnia: Secondary | ICD-10-CM | POA: Diagnosis not present

## 2021-03-17 DIAGNOSIS — R269 Unspecified abnormalities of gait and mobility: Secondary | ICD-10-CM | POA: Diagnosis present

## 2021-03-17 DIAGNOSIS — I5033 Acute on chronic diastolic (congestive) heart failure: Secondary | ICD-10-CM | POA: Diagnosis present

## 2021-03-17 DIAGNOSIS — J90 Pleural effusion, not elsewhere classified: Secondary | ICD-10-CM | POA: Diagnosis not present

## 2021-03-17 DIAGNOSIS — I4891 Unspecified atrial fibrillation: Secondary | ICD-10-CM | POA: Diagnosis not present

## 2021-03-17 DIAGNOSIS — N281 Cyst of kidney, acquired: Secondary | ICD-10-CM | POA: Diagnosis not present

## 2021-03-17 DIAGNOSIS — Z6841 Body Mass Index (BMI) 40.0 and over, adult: Secondary | ICD-10-CM | POA: Diagnosis not present

## 2021-03-17 DIAGNOSIS — I480 Paroxysmal atrial fibrillation: Secondary | ICD-10-CM | POA: Diagnosis not present

## 2021-03-17 DIAGNOSIS — Z8261 Family history of arthritis: Secondary | ICD-10-CM

## 2021-03-17 DIAGNOSIS — E662 Morbid (severe) obesity with alveolar hypoventilation: Secondary | ICD-10-CM | POA: Diagnosis present

## 2021-03-17 DIAGNOSIS — Z87442 Personal history of urinary calculi: Secondary | ICD-10-CM

## 2021-03-17 DIAGNOSIS — Z992 Dependence on renal dialysis: Secondary | ICD-10-CM

## 2021-03-17 DIAGNOSIS — I10 Essential (primary) hypertension: Secondary | ICD-10-CM | POA: Diagnosis not present

## 2021-03-17 DIAGNOSIS — R4182 Altered mental status, unspecified: Secondary | ICD-10-CM | POA: Diagnosis not present

## 2021-03-17 DIAGNOSIS — I482 Chronic atrial fibrillation, unspecified: Secondary | ICD-10-CM | POA: Diagnosis not present

## 2021-03-17 DIAGNOSIS — I2699 Other pulmonary embolism without acute cor pulmonale: Secondary | ICD-10-CM | POA: Diagnosis present

## 2021-03-17 DIAGNOSIS — Z86711 Personal history of pulmonary embolism: Secondary | ICD-10-CM

## 2021-03-17 DIAGNOSIS — J9600 Acute respiratory failure, unspecified whether with hypoxia or hypercapnia: Secondary | ICD-10-CM | POA: Diagnosis not present

## 2021-03-17 DIAGNOSIS — R531 Weakness: Secondary | ICD-10-CM | POA: Diagnosis not present

## 2021-03-17 DIAGNOSIS — Z743 Need for continuous supervision: Secondary | ICD-10-CM | POA: Diagnosis not present

## 2021-03-17 DIAGNOSIS — E049 Nontoxic goiter, unspecified: Secondary | ICD-10-CM | POA: Diagnosis present

## 2021-03-17 DIAGNOSIS — B9629 Other Escherichia coli [E. coli] as the cause of diseases classified elsewhere: Secondary | ICD-10-CM | POA: Diagnosis not present

## 2021-03-17 DIAGNOSIS — L8990 Pressure ulcer of unspecified site, unspecified stage: Secondary | ICD-10-CM | POA: Insufficient documentation

## 2021-03-17 DIAGNOSIS — B9689 Other specified bacterial agents as the cause of diseases classified elsewhere: Secondary | ICD-10-CM | POA: Diagnosis present

## 2021-03-17 DIAGNOSIS — Z923 Personal history of irradiation: Secondary | ICD-10-CM

## 2021-03-17 DIAGNOSIS — I4819 Other persistent atrial fibrillation: Secondary | ICD-10-CM | POA: Diagnosis present

## 2021-03-17 LAB — RESP PANEL BY RT-PCR (FLU A&B, COVID) ARPGX2
Influenza A by PCR: NEGATIVE
Influenza B by PCR: NEGATIVE
SARS Coronavirus 2 by RT PCR: NEGATIVE

## 2021-03-17 LAB — URINALYSIS, ROUTINE W REFLEX MICROSCOPIC
Bilirubin Urine: NEGATIVE
Glucose, UA: NEGATIVE mg/dL
Nitrite: NEGATIVE
Protein, ur: >300 mg/dL — AB
RBC / HPF: >50 RBC/hpf — ABNORMAL HIGH (ref 0–5)
Specific Gravity, Urine: 1.02 (ref 1.005–1.030)
WBC, UA: >50 WBC/hpf — ABNORMAL HIGH (ref 0–5)
pH: 7 (ref 5.0–8.0)

## 2021-03-17 LAB — CBC
HCT: 28.9 % — ABNORMAL LOW (ref 36.0–46.0)
Hemoglobin: 8.3 g/dL — ABNORMAL LOW (ref 12.0–15.0)
MCH: 27.6 pg (ref 26.0–34.0)
MCHC: 28.7 g/dL — ABNORMAL LOW (ref 30.0–36.0)
MCV: 96 fL (ref 80.0–100.0)
Platelets: 265 10*3/uL (ref 150–400)
RBC: 3.01 MIL/uL — ABNORMAL LOW (ref 3.87–5.11)
RDW: 16.5 % — ABNORMAL HIGH (ref 11.5–15.5)
WBC: 13 10*3/uL — ABNORMAL HIGH (ref 4.0–10.5)
nRBC: 0.5 % — ABNORMAL HIGH (ref 0.0–0.2)

## 2021-03-17 LAB — COMPREHENSIVE METABOLIC PANEL
ALT: 25 U/L (ref 0–44)
AST: 27 U/L (ref 15–41)
Albumin: 3.2 g/dL — ABNORMAL LOW (ref 3.5–5.0)
Alkaline Phosphatase: 94 U/L (ref 38–126)
Anion gap: 7 (ref 5–15)
BUN: 15 mg/dL (ref 6–20)
CO2: 29 mmol/L (ref 22–32)
Calcium: 7.9 mg/dL — ABNORMAL LOW (ref 8.9–10.3)
Chloride: 96 mmol/L — ABNORMAL LOW (ref 98–111)
Creatinine, Ser: 1.99 mg/dL — ABNORMAL HIGH (ref 0.44–1.00)
GFR, Estimated: 29 mL/min — ABNORMAL LOW (ref >60)
Glucose, Bld: 119 mg/dL — ABNORMAL HIGH (ref 70–99)
Potassium: 3.5 mmol/L (ref 3.5–5.1)
Sodium: 132 mmol/L — ABNORMAL LOW (ref 135–145)
Total Bilirubin: 0.4 mg/dL (ref 0.3–1.2)
Total Protein: 7.3 g/dL (ref 6.5–8.1)

## 2021-03-17 LAB — CBG MONITORING, ED: Glucose-Capillary: 182 mg/dL — ABNORMAL HIGH (ref 70–99)

## 2021-03-17 MED ORDER — AMLODIPINE BESYLATE 5 MG PO TABS
5.0000 mg | ORAL_TABLET | Freq: Every day | ORAL | Status: DC
  Administered 2021-03-17 – 2021-03-28 (×8): 5 mg via ORAL
  Filled 2021-03-17 (×9): qty 1

## 2021-03-17 MED ORDER — FOLIC ACID 1 MG PO TABS
1000.0000 ug | ORAL_TABLET | Freq: Every day | ORAL | Status: DC
  Administered 2021-03-17 – 2021-03-28 (×11): 1 mg via ORAL
  Filled 2021-03-17 (×12): qty 1

## 2021-03-17 MED ORDER — GERHARDT'S BUTT CREAM
3.0000 "application " | TOPICAL_CREAM | Freq: Three times a day (TID) | CUTANEOUS | Status: DC
  Administered 2021-03-18 – 2021-03-28 (×21): 3 via TOPICAL
  Filled 2021-03-17 (×4): qty 3

## 2021-03-17 MED ORDER — TRAMADOL HCL 50 MG PO TABS
50.0000 mg | ORAL_TABLET | Freq: Four times a day (QID) | ORAL | Status: DC | PRN
  Administered 2021-03-17 – 2021-03-24 (×7): 50 mg via ORAL
  Filled 2021-03-17 (×9): qty 1

## 2021-03-17 MED ORDER — LEVOTHYROXINE SODIUM 112 MCG PO TABS
112.0000 ug | ORAL_TABLET | Freq: Every day | ORAL | Status: DC
  Administered 2021-03-21 – 2021-03-28 (×7): 112 ug via ORAL
  Filled 2021-03-17 (×13): qty 1

## 2021-03-17 MED ORDER — ALBUTEROL SULFATE (2.5 MG/3ML) 0.083% IN NEBU
3.0000 mL | INHALATION_SOLUTION | Freq: Four times a day (QID) | RESPIRATORY_TRACT | Status: DC | PRN
  Administered 2021-03-22 – 2021-03-28 (×7): 3 mL via RESPIRATORY_TRACT
  Filled 2021-03-17 (×10): qty 3

## 2021-03-17 MED ORDER — DULAGLUTIDE 1.5 MG/0.5ML ~~LOC~~ SOAJ: 1.5000 mg | SUBCUTANEOUS | Status: DC

## 2021-03-17 MED ORDER — METOPROLOL TARTRATE 50 MG PO TABS
100.0000 mg | ORAL_TABLET | Freq: Two times a day (BID) | ORAL | Status: DC
  Administered 2021-03-17 – 2021-03-24 (×11): 100 mg via ORAL
  Filled 2021-03-17 (×12): qty 2

## 2021-03-17 MED ORDER — APIXABAN 5 MG PO TABS
5.0000 mg | ORAL_TABLET | Freq: Two times a day (BID) | ORAL | Status: DC
  Administered 2021-03-17 – 2021-03-19 (×4): 5 mg via ORAL
  Filled 2021-03-17 (×5): qty 1

## 2021-03-17 MED ORDER — CLONIDINE HCL 0.1 MG PO TABS
0.2000 mg | ORAL_TABLET | Freq: Two times a day (BID) | ORAL | Status: DC
  Administered 2021-03-17 – 2021-03-18 (×3): 0.2 mg via ORAL
  Filled 2021-03-17 (×4): qty 2

## 2021-03-17 MED ORDER — ERGOCALCIFEROL 1.25 MG (50000 UT) PO CAPS: 50000.0000 [IU] | ORAL_CAPSULE | ORAL | Status: DC

## 2021-03-17 MED ORDER — CALCITRIOL 0.25 MCG PO CAPS
0.2500 ug | ORAL_CAPSULE | Freq: Every day | ORAL | Status: DC
  Administered 2021-03-17 – 2021-03-28 (×10): 0.25 ug via ORAL
  Filled 2021-03-17 (×13): qty 1

## 2021-03-17 MED ORDER — CALCIUM CARBONATE 1250 (500 CA) MG PO TABS
1.0000 | ORAL_TABLET | Freq: Every day | ORAL | Status: DC
  Administered 2021-03-17 – 2021-03-25 (×8): 500 mg via ORAL
  Filled 2021-03-17 (×10): qty 1

## 2021-03-17 MED ORDER — GABAPENTIN 300 MG PO CAPS
300.0000 mg | ORAL_CAPSULE | Freq: Every day | ORAL | Status: DC
  Administered 2021-03-17 – 2021-03-27 (×10): 300 mg via ORAL
  Filled 2021-03-17 (×10): qty 1

## 2021-03-17 MED ORDER — ACETAMINOPHEN 325 MG PO TABS
650.0000 mg | ORAL_TABLET | Freq: Four times a day (QID) | ORAL | Status: DC | PRN
  Administered 2021-03-19 – 2021-03-20 (×2): 650 mg via ORAL
  Filled 2021-03-17 (×2): qty 2

## 2021-03-17 MED ORDER — MELATONIN 5 MG PO TABS
10.0000 mg | ORAL_TABLET | Freq: Every evening | ORAL | Status: DC | PRN
  Administered 2021-03-17 – 2021-03-27 (×6): 10 mg via ORAL
  Filled 2021-03-17 (×7): qty 2

## 2021-03-17 MED ORDER — SEVELAMER CARBONATE 800 MG PO TABS
800.0000 mg | ORAL_TABLET | Freq: Three times a day (TID) | ORAL | Status: DC
  Administered 2021-03-18 – 2021-03-28 (×19): 800 mg via ORAL
  Filled 2021-03-17 (×26): qty 1

## 2021-03-17 MED ORDER — ATORVASTATIN CALCIUM 20 MG PO TABS
20.0000 mg | ORAL_TABLET | Freq: Every day | ORAL | Status: DC
  Administered 2021-03-17 – 2021-03-27 (×10): 20 mg via ORAL
  Filled 2021-03-17 (×10): qty 1

## 2021-03-17 NOTE — ED Provider Notes (Signed)
[facility] Provider Note    Event Date/Time   First MD Initiated Contact with Patient 03/17/21 1722     (approximate)   History   No chief complaint on file.   HPI  Nicole Schmidt is a 56 y.o. female   with past medical history of chronic hypoxic hemic respiratory failure on 2 L nasal cannula, diabetes, breast cancer, history of recent left kidney stone with stent and lithotripsy, atrial fibrillation and pulmonary emboli on Eliquis and end-stage renal disease who presents with lower extreme edema and inability to care for self at home.  Patient was admitted to rehab on 03/04/2021 where she underwent PT OT and had dialysis.  Prior to this she had been hospitalized for volume overload with AKI on CKD where she was initiated on dialysis.  Patient was cleared by physical therapy to go home.  She was discharged yesterday had significant difficulty getting in the car and her ex had to help her get into the car and then out into her home.  She has 2 steps to get into the house and has significant difficulty and requires assistance with this.  Today patient was transported to dialysis and then transported back by medics.  Does not feel like she is able to get around at home or get in and out of the house and does not feel that she is safe to be at home by herself.  She notes that she had dialysis Tuesday Wednesday and Thursday, blood pressures been dropping during dialysis they have not been able to get as much fluid off of her, took 1.5 L off today.  She feels like her legs are more swollen and this is why she is having more difficulty than when she was working with PT during the rehab.  Denies fevers or chills.  Denies urinary symptoms.  Has mild shortness of breath no chest pain.  Past Medical History:  Diagnosis Date   (HFpEF) heart failure with preserved ejection fraction ([place])    a. 10/2018 Echo: EF 60-65%, diast dysfxn, RVSP 50.7 mmHg, mildly dil LA.   Acquired  hypothyroidism 02/09/2020   Anemia    Arthritis    Breast cancer ([place]) 2014   right breast cancer   Breast cancer of upper-inner quadrant of right female breast ([place])    Right breast invasive CA and DCIS , 7 mm T1,N0,M0. Er/PR pos, her 2 negative.  Margins 1 mm.   CHF (congestive heart failure) (HCC)    CKD (chronic kidney disease), stage III (HCC)    Diabetes mellitus type 2 in obese ([place]) 02/09/2020   Diabetes mellitus without complication ([place])    Dyslipidemia 02/09/2020   Essential hypertension    Gout    Hypothyroidism    Menopause    age 89   Morbid obesity ([place])    Persistent atrial fibrillation ([place])    a. Dx 10/2018 in setting of PNA. CHA2DS2VASc = 4-->Eliquis; b. 11/2018 s/p successful DCCV.   Personal history of radiation therapy    Pulmonary embolism ([place]) 10/2014   a. Chronic eliquis.   Thyroid goiter     Patient Active Problem List   Diagnosis Date Noted   Debility 03/04/2021   ESRD (end stage renal disease) ([place]) 03/02/2021   Acute on chronic renal failure ([place]) 02/24/2021   Viral URI    Multifocal pneumonia 02/19/2021   Hydronephrosis with renal and ureteral calculus obstruction 02/19/2021   Hyperkalemia 02/19/2021   Cardiorenal syndrome, stage 1-4 or  unspecified chronic kidney disease, with heart failure ([place]) 02/09/2021    Class: Hospitalized for   AKI (acute kidney injury) ([place]) 02/09/2021   Bronchitis 02/09/2021   Cardiorenal syndrome with renal failure 02/09/2021   Chronic respiratory failure with hypoxia ([place]) 01/13/2021   Physical deconditioning 03/16/2020   Abnormal findings on diagnostic imaging of lung 03/16/2020   AF (paroxysmal atrial fibrillation) ([place])    Acquired thrombophilia ([place])    Acute on chronic respiratory failure with hypoxia ([place]) 02/21/2020   Acute on chronic respiratory failure with hypoxemia ([place]) 02/21/2020   Atrial fibrillation, chronic ([place]) 02/09/2020   Dyslipidemia 02/09/2020   Diabetes mellitus type 2 in obese ([place])  02/09/2020   Acquired hypothyroidism 02/09/2020   Thrush, oral 02/09/2020   Morbid obesity with BMI of 60.0-69.9, adult ([place]) 02/03/2020   Chronic anticoagulation 12/30/2019   Obstructive sleep apnea 08/04/2019   Atrial fibrillation with RVR ([place]) 10/18/2018   Acute hypoxemic respiratory failure ([place]) 03/29/2018   Acute on chronic diastolic CHF (congestive heart failure) ([place]) 02/04/2018   Lymphedema 02/04/2018   Essential hypertension 12/09/2017   Long term (current) use of aromatase inhibitors 11/17/2017   Elevated alkaline phosphatase level 05/20/2017   Goals of care, counseling/discussion 05/14/2017   Chronic renal disease, stage III ([place]) 07/08/2015   Iron deficiency anemia 07/08/2015   Acute renal failure superimposed on stage 4 chronic kidney disease ([place]) 10/22/2014   History of breast cancer 10/22/2014   Elevated troponin 10/22/2014   Pulmonary emboli ([place]) 10/18/2014   History of pulmonary embolus (PE) 10/18/2014   Candidiasis of breast 07/06/2014   Malignant neoplasm of right female breast ([place]) 07/24/2012     Physical Exam  Triage Vital Signs: ED Triage Vitals  Enc Vitals Group     BP 03/17/21 1619 (!) 167/78     Pulse Rate 03/17/21 1619 95     Resp 03/17/21 1619 20     Temp 03/17/21 1619 98.9 F (37.2 C)     Temp Source 03/17/21 1619 Oral     SpO2 03/17/21 1619 92 %     Weight 03/17/21 1616 (!) 352 lb (159.7 kg)     Height 03/17/21 1616 5\' 4"  (1.626 m)     Head Circumference --      Peak Flow --      Pain Score 03/17/21 1615 0     Pain Loc --      Pain Edu? --      Excl. in [place]? --     Most recent vital signs: Vitals:   03/17/21 2144 03/17/21 2145  BP: (!) 156/78   Pulse:  86  Resp:    Temp:    SpO2:       General: Awake, no distress.  CV:  Good peripheral perfusion.  Significant bilateral lower extremity edema with chronic venous stasis changes, tight, pitting edema Resp:  Normal effort.  No increased work of breathing Abd:  No distention.   Nontender Neuro:             Awake, Alert, Oriented x 3  Other:     ED Results / Procedures / Treatments  Labs (all labs ordered are listed, but only abnormal results are displayed) Labs Reviewed  COMPREHENSIVE METABOLIC PANEL - Abnormal; Notable for the following components:      Result Value   Sodium 132 (*)    Chloride 96 (*)    Glucose, Bld 119 (*)    Creatinine, Ser 1.99 (*)    Calcium 7.9 (*)  Albumin 3.2 (*)    GFR, Estimated 29 (*)    All other components within normal limits  CBC - Abnormal; Notable for the following components:   WBC 13.0 (*)    RBC 3.01 (*)    Hemoglobin 8.3 (*)    HCT 28.9 (*)    MCHC 28.7 (*)    RDW 16.5 (*)    nRBC 0.5 (*)    All other components within normal limits  URINALYSIS, ROUTINE W REFLEX MICROSCOPIC - Abnormal; Notable for the following components:   Color, Urine YELLOW (*)    APPearance CLOUDY (*)    Hgb urine dipstick LARGE (*)    Ketones, ur TRACE (*)    Protein, ur >300 (*)    Leukocytes,Ua LARGE (*)    RBC / HPF >50 (*)    WBC, UA >50 (*)    Bacteria, UA MANY (*)    Non Squamous Epithelial PRESENT (*)    All other components within normal limits  CBG MONITORING, ED - Abnormal; Notable for the following components:   Glucose-Capillary 182 (*)    All other components within normal limits  RESP PANEL BY RT-PCR (FLU A&B, COVID) ARPGX2     EKG     RADIOLOGY    PROCEDURES:    MEDICATIONS ORDERED IN ED: Medications  acetaminophen (TYLENOL) tablet 650 mg (has no administration in time range)  albuterol (PROVENTIL) (2.5 MG/3ML) 0.083% nebulizer solution 3 mL (has no administration in time range)  amLODipine (NORVASC) tablet 5 mg (5 mg Oral Given 03/17/21 2145)  apixaban (ELIQUIS) tablet 5 mg (5 mg Oral Given 03/17/21 2147)  atorvastatin (LIPITOR) tablet 20 mg (20 mg Oral Given 03/17/21 2147)  calcitRIOL (ROCALTROL) capsule 0.25 mcg (0.25 mcg Oral Given 03/17/21 2148)  calcium carbonate (OS-CAL - dosed in mg of elemental  calcium) tablet 500 mg of elemental calcium (500 mg of elemental calcium Oral Given 03/17/21 2147)  cloNIDine (CATAPRES) tablet 0.2 mg (0.2 mg Oral Given 03/17/21 2144)  Dulaglutide SOPN 1.5 mg (1.5 mg Subcutaneous Not Given 03/17/21 2212)  ergocalciferol (VITAMIN D2) capsule 50,000 Units (has no administration in time range)  folic acid (FOLVITE) tablet 1 mg (1 mg Oral Given 03/17/21 2149)  gabapentin (NEURONTIN) capsule 300 mg (300 mg Oral Given 03/17/21 2146)  levothyroxine (SYNTHROID) tablet 112 mcg (has no administration in time range)  melatonin tablet 10 mg (has no administration in time range)  metoprolol tartrate (LOPRESSOR) tablet 100 mg (100 mg Oral Given 03/17/21 2146)  Gerhardt's butt cream 3 application (has no administration in time range)  sevelamer carbonate (RENVELA) tablet 800 mg (has no administration in time range)  traMADol (ULTRAM) tablet 50 mg (has no administration in time range)     IMPRESSION / MDM / ASSESSMENT AND PLAN / ED COURSE  I reviewed the triage vital signs and the nursing notes.                              Differential diagnosis includes, but is not limited to, deconditioning, volume overload, CKD  This patient is a 56 year old female who has a history of end-stage renal disease and chronic respiratory failure on 2 L nasal cannula to diastolic heart failure who presents because she is not able to care for herself at home.  She was recently admitted to the hospital at [facility] where she had worsening renal failure and was started on dialysis.  She was ultimately discharged to rehab facility  at [facility] where she was getting dialyzed daily and working with PT and OT.  She was cleared by PT to go home and was discharged yesterday.  She required medics to help her get into the car and to get into her home and she was transported to dialysis today but did not feel that she was going to be able to take care of herself at home so after dialysis came to the emergency department by  ambulance.  Patient tells me that she is barely able to transfer herself and has significant difficulty ambulating around her house with her walker.  She is not able to get up to 2 steps to get into her house on her own.  Patient feels like her legs are more swollen over the last several days because she has not had as much ultrafiltration at dialysis because she has become hypotensive during sessions.  On exam today patient is not in any respiratory distress and is stable on her 2 L nasal cannula.  Does have significant lower extremity edema but I imagine that this is largely chronic.  She has chronic skin changes.  Her labs overall today are reassuring.  She has a mild leukocytosis, hemoglobin is near baseline, electrolytes are okay.  She is COVID-negative.  Ultimately as patient was just discharged from rehab today and there is no acute medical complaints do not feel that she requires admission.  Discussed with Dr. Zollie Scale with nephrology who is aware of her need for dialysis while she is in the ED.  We will order home meds and consult social work.      FINAL CLINICAL IMPRESSION(S) / ED DIAGNOSES   Final diagnoses:  Ambulatory dysfunction  Physical deconditioning  ESRD (end stage renal disease) ([place])     Rx / DC Orders   ED Discharge Orders     None        Note:  This document was prepared using Dragon voice recognition software and may include unintentional dictation errors.   Rada Hay, MD 03/17/21 2212

## 2021-03-17 NOTE — Progress Notes (Signed)
Late Entry Note:  D/C summary faxed to [facility] this am for continuation of care (fax# [phone]).

## 2021-03-17 NOTE — Progress Notes (Signed)
Patient approved by [facility].

## 2021-03-17 NOTE — ED Triage Notes (Signed)
Rehab dialysis facility, d/c'd last pm, couldn't make it up the steps, today was first appt at [facility], pt had dialysis but states that she is so swollen and can't walk. Could only get 1.7 liters off her today at dialysis and had been usually getting 3 liters. Pt lives alone with steps and is unable to get up and down the steps. Pt needs a social work consult to get in and out of her house Ems reports that she was able to stand and pivot from the dialysis chair to the stretcher  Pt reports that she was up walking with the walker, but states that her swelling has increased and states that this is the reason she is unable to walk

## 2021-03-17 NOTE — ED Notes (Signed)
Pt moved to recliner per request for comfort.

## 2021-03-17 NOTE — Progress Notes (Signed)
Inpatient Rehabilitation Care Coordinator Discharge Note   Patient Details  Name: Nicole Schmidt MRN: [id number] Date of Birth: 07-08-1965   Discharge location: Home  Length of Stay:    Discharge activity level: Modified Independent  Home/community participation: family  Patient response YD:XAJOIN Literacy - How often do you need to have someone help you when you read instructions, pamphlets, or other written material from your doctor or pharmacy?: Never  Patient response OM:VEHMCN Isolation - How often do you feel lonely or isolated from those around you?: Never  Services provided included: MD, RD, PT, OT, SLP, RN, CM, TR, Pharmacy, SW  Financial Services:  Charity fundraiser Utilized: [place] offered to/list presented to: patient  Follow-up services arranged:  [place]: TBD [place]         Patient response to transportation need: Is the patient able to respond to transportation needs?: Yes In the past 12 months, has lack of transportation kept you from medical appointments or from getting medications?: No In the past 12 months, has lack of transportation kept you from meetings, work, or from getting things needed for daily living?: No    Comments (or additional information):  Patient/Family verbalized understanding of follow-up arrangements:  Yes  Individual responsible for coordination of the follow-up plan: PATIENT  Confirmed correct DME delivered: Dyanne Iha 03/17/2021    Dyanne Iha

## 2021-03-17 NOTE — Progress Notes (Signed)
Patient ID: Nicole Schmidt, female   DOB: 1965-06-22, 56 y.o.   MRN: [id number]  [facility] referral sent to [facility] for review.

## 2021-03-18 LAB — HEPATITIS B SURFACE ANTIBODY,QUALITATIVE: Hep B S Ab: NONREACTIVE

## 2021-03-18 LAB — HEPATITIS B SURFACE ANTIGEN: Hepatitis B Surface Ag: NONREACTIVE

## 2021-03-18 MED ORDER — HEPARIN SODIUM (PORCINE) 1000 UNIT/ML IJ SOLN
INTRAMUSCULAR | Status: AC
  Administered 2021-03-18: 4200 [IU] via INTRAVENOUS_CENTRAL
  Filled 2021-03-18: qty 10

## 2021-03-18 MED ORDER — CHLORHEXIDINE GLUCONATE CLOTH 2 % EX PADS
6.0000 | MEDICATED_PAD | Freq: Every day | CUTANEOUS | Status: DC
  Administered 2021-03-21: 6 via TOPICAL
  Filled 2021-03-18 (×4): qty 6

## 2021-03-18 MED ORDER — LIDOCAINE HCL (PF) 1 % IJ SOLN: 5.0000 mL | INTRAMUSCULAR | Status: DC | PRN

## 2021-03-18 MED ORDER — LIDOCAINE-PRILOCAINE 2.5-2.5 % EX CREA: 1.0000 "application " | TOPICAL_CREAM | CUTANEOUS | Status: DC | PRN

## 2021-03-18 MED ORDER — SODIUM CHLORIDE 0.9 % IV SOLN: 100.0000 mL | INTRAVENOUS | Status: DC | PRN

## 2021-03-18 MED ORDER — PENTAFLUOROPROP-TETRAFLUOROETH EX AERO
1.0000 "application " | INHALATION_SPRAY | CUTANEOUS | Status: DC | PRN
  Filled 2021-03-18: qty 30

## 2021-03-18 MED ORDER — ALTEPLASE 2 MG IJ SOLR
2.0000 mg | Freq: Once | INTRAMUSCULAR | Status: DC | PRN
  Filled 2021-03-18: qty 2

## 2021-03-18 MED ORDER — HEPARIN SODIUM (PORCINE) 1000 UNIT/ML DIALYSIS
1000.0000 [IU] | INTRAMUSCULAR | Status: DC | PRN
  Filled 2021-03-18 (×2): qty 1

## 2021-03-18 MED ORDER — VITAMIN D (ERGOCALCIFEROL) 1.25 MG (50000 UNIT) PO CAPS
50000.0000 [IU] | ORAL_CAPSULE | ORAL | Status: DC
  Administered 2021-03-23: 50000 [IU] via ORAL
  Filled 2021-03-18: qty 1

## 2021-03-18 NOTE — ED Provider Notes (Signed)
----------------------------------------- °  5:17 AM on 03/18/2021 -----------------------------------------   Blood pressure (!) 156/78, pulse 86, temperature 98.9 F (37.2 C), temperature source Oral, resp. rate 20, height 5\' 4"  (1.626 m), weight (!) 159.7 kg, last menstrual period 07/17/2012, SpO2 92 %.  The patient is calm and cooperative at this time.  There have been no acute events since the last update.  Awaiting disposition plan from Social Work team.   Paulette Blanch, MD 03/18/21 [phone]

## 2021-03-18 NOTE — ED Notes (Signed)
Pt assisted to bedside commode

## 2021-03-18 NOTE — Discharge Instructions (Signed)
Resources to get a ramp at home:  [employer] [place]  -for permanent ramp -call [place] at [phone] -will have to obtain documentation to confirm medical and financial eligibility  [employer]  - for temporary Ramp -call [phone]  [place]ara [place]isa up with [facility]- they should be reaching out in the next 24-48 hours. To set up initial visit.

## 2021-03-18 NOTE — Progress Notes (Signed)
OT Cancellation Note  Patient Details Name: TYGER OKA MRN: [id number] DOB: Nov 25, 1965   Cancelled Treatment:    Reason Eval/Treat Not Completed: Patient at procedure or test/ unavailable. OT order received and chart reviewed. Per chart review, pt has recently left ED to go to dialysis. OT to re-attempt when pt is next available.   Darleen Crocker, MS, OTR/L , CBIS ascom [phone]  03/18/21, 12:34 PM

## 2021-03-18 NOTE — ED Notes (Signed)
Pt provided with sandwich tray and diet ginger ale.

## 2021-03-18 NOTE — Evaluation (Signed)
Physical Therapy Evaluation Patient Details Name: Nicole Schmidt MRN: [id number] DOB: Apr 15, 1965 Today's Date: 03/18/2021  History of Present Illness  Nicole Schmidt is a 34yoF who comes to [facility] on 03/17/21 after acute increase in edema limiting ability to walk. Pt DC to home previous day from inpatient rehab in [place]. PMH: ESRD on HD TTS, DMN, HTN, HLD, AF, PE, anemia, dCHF. Pt was at [facility] from 1/21-1/31.  Clinical Impression  Pt admitted with above diagnosis. Pt currently with functional limitations due to the deficits listed below (see "PT Problem List"). Upon entry, pt in recliner, agreeable to participate. The pt is alert, pleasant, interactive, and able to provide info regarding prior level of function, both in tolerance and independence. Pt largely at same level of function as when leaving CIR just a couple days prior, but reports fluctuating edema in legs making it more difficulty to perform active hip/knee flexion for stairs, which became a new barrier as a new HD patient still acutely deconditioned and having to navigate entry steps at home. Pt also having hypotension issues at HD, which I suspect also played a role in her post-dialyzed state, having more difficulty with basic AMB activity. Over 10 days at inpatient rehab, her AMB distance tolerance rarely exceeded 111ft, hence it is not realisitic to expect a rapid change in this area while admitted- full rehabilitation of AMB tolerance may require weeks to months. Pt now concedes that a ramp in place at home is needed, and driving herself to HD is not a viable option in her current state until she is able to regain more strength and activity tolerance. Patient's performance this date reveals decreased ability, independence, and tolerance in performing all basic mobility required for performance of activities of daily living. Pt requires additional DME, close physical assistance, and cues for safe participate in mobility. Pt will benefit from skilled  PT intervention to increase independence and safety with basic mobility in preparation for discharge to the venue listed below.         Recommendations for follow up therapy are one component of a multi-disciplinary discharge planning process, led by the attending physician.  Recommendations may be updated based on patient status, additional functional criteria and insurance authorization.  Follow Up Recommendations Home health PT    Assistance Recommended at Discharge Set up Supervision/Assistance  Patient can return home with the following  Help with stairs or ramp for entrance;Assist for transportation    Equipment Recommendations    Recommendations for Other Services       Functional Status Assessment Patient has had a recent decline in their functional status and demonstrates the ability to make significant improvements in function in a reasonable and predictable amount of time.     Precautions / Restrictions Precautions Precautions: Fall Restrictions Weight Bearing Restrictions: No      Mobility  Bed Mobility               General bed mobility comments: in recliner at entry/exit    Transfers Overall transfer level: Needs assistance Equipment used: None Transfers: Sit to/from Stand             General transfer comment: labored, but able, 1 LOB over 6 STS    Ambulation/Gait Ambulation/Gait assistance:  (none this session)                Stairs            Wheelchair Mobility    Modified Rankin (Stroke Patients Only)  Balance                                             Pertinent Vitals/Pain Pain Assessment Pain Assessment: No/denies pain    Home Living Family/patient expects to be discharged to:: Private residence Living Arrangements: Alone Available Help at Discharge: Friend(s) (Best friend Zadie Rhine.) Type of Home: House (pt's house in [place], [place] her mother's house) Home Access: Stairs to  enter Entrance Stairs-Rails: Right;Left;Can reach both [employer] of Steps: 2 steps in front with no rails and 4 steps in the back with 2 rails - plan to get railings installed on front steps (months back using back door consistnetly due to need for rails)   Home Layout: One level Home Equipment: [place] - single point;Rollator (4 wheels);Rolling Walker (2 wheels);BSC/3in1;Wheelchair - manual;Shower seat (believes WC will not go through bedroom door or bathroom door.)      Prior Function                       Hand Dominance        Extremity/Trunk Assessment                Communication      Cognition Arousal/Alertness: Awake/alert Behavior During Therapy: WFL for tasks assessed/performed Overall Cognitive Status: Within Functional Limits for tasks assessed                                          General Comments      Exercises Other Exercises Other Exercises: Seated 8xSTS from chair hands free Other Exercises: Seated LAQ 1x10 bilat   Assessment/Plan    PT Assessment Patient needs continued PT services  PT Problem List Decreased activity tolerance;Cardiopulmonary status limiting activity;Decreased balance;Decreased strength;Decreased mobility       PT Treatment Interventions DME instruction;Therapeutic exercise;Gait training;Balance training;Stair training;Neuromuscular re-education;Functional mobility training;Patient/family education;Therapeutic activities    PT Goals (Current goals can be found in the Care Plan section)  Acute Rehab PT Goals Patient Stated Goal: be able to manage home entry/exit PT Goal Formulation: With patient Time For Goal Achievement: 04/01/21 Potential to Achieve Goals: Fair    Frequency Min 2X/week     Co-evaluation               AM-PAC PT "6 Clicks" Mobility  Outcome Measure Help needed turning from your back to your side while in a flat bed without using bedrails?: A Little Help needed  moving from lying on your back to sitting on the side of a flat bed without using bedrails?: A Little Help needed moving to and from a bed to a chair (including a wheelchair)?: A Little Help needed standing up from a chair using your arms (e.g., wheelchair or bedside chair)?: A Little Help needed to walk in hospital room?: A Little Help needed climbing 3-5 steps with a railing? : A Lot 6 Click Score: 17    End of Session Equipment Utilized During Treatment: Oxygen Activity Tolerance: Patient tolerated treatment well Patient left: in chair;with call bell/phone within reach;with nursing/sitter in room Nurse Communication: Mobility status PT Visit Diagnosis: Unsteadiness on feet (R26.81);Muscle weakness (generalized) (M62.81);Difficulty in walking, not elsewhere classified (R26.2)    Time: 6010-9323 PT Time Calculation (min) (ACUTE ONLY): 25 min  Charges:   PT Evaluation $PT Eval Moderate Complexity: 1 Mod        11:41 AM, 03/18/21 Etta Grandchild, PT, DPT Physical Therapist - [facility]  [phone] ([place])    [place] [facility] 03/18/2021, 11:32 AM

## 2021-03-18 NOTE — ED Notes (Signed)
Pt going to dialysis, consent obtained by Benay Pillow

## 2021-03-18 NOTE — TOC Progression Note (Signed)
Transition of Care [facility]) - Progression Note    Patient Details  Name: DAMYIA STRIDER MRN: [id number] Date of Birth: 1965-06-20  [facility]) CM/SW Contact  Shelbie Hutching, RN Phone Number: 03/18/2021, 4:19 PM  Clinical Narrative:     Confirmed with [facility] that patient has been accepted and they are working on the referral now.  Potential for patient to discharge home this evening if nephrology does not think she needs to be admitted.  Ramp resources given to [facility] dialysis navigator to give to the patient and her relative Manuela Schwartz.    Expected Discharge Plan: [place] Barriers to Discharge: Continued Medical Work up  Expected Discharge Plan and Services Expected Discharge Plan: [place]   Discharge Planning Services: CM Consult Post Acute Care Choice: Home Health, Resumption of Svcs/PTA Provider Living arrangements for the past 2 months: Single Family Home                 DME Arranged: N/A DME Agency: NA       HH Arranged: RN, PT, OT HH Agency:  ([facility] [place])         Social Determinants of Health (SDOH) Interventions    Readmission Risk Interventions Readmission Risk Prevention Plan 02/11/2020 10/19/2018  Transportation Screening Complete Complete  PCP or Specialist Appt within 3-5 Days Not Complete -  Not Complete comments To be scheduled when discharged. -  [place] or Home Care Consult Complete -  Social Work Consult for [place] Planning/Counseling Complete Patient refused  Palliative Care Screening Complete Not Applicable  Medication Review Press photographer) Complete Complete  Some recent data might be hidden

## 2021-03-18 NOTE — TOC Initial Note (Addendum)
Transition of Care [facility]) - Initial/Assessment Note    Patient Details  Name: Nicole Schmidt MRN: [id number] Date of Birth: 1965/07/07  Transition of Care [facility]) CM/SW Contact:    Shelbie Hutching, RN Phone Number: 03/18/2021, 1:01 PM  Clinical Narrative:                 Patient seen in the emergency department due to ambulatory issues at home, she is unable to get up her steps at home.  Patient was discharged from Acute inpatient rehab at [facility] on 2/1- returned to the emergency department on 2/2.  Patient reports that her legs are so swollen that she can't bend them properly to navigate the steps, she also cannot drive herself to dialysis.  Patient is currently in dialysis now, they will try to remove as much fluid as possible today, nephrology also mentioned that they wanted to dialyze her tomorrow as well.  No indication at this time for admission. PT worked with patient and is recommending home health.  Acute inpatient rehab per notes arranged [facility] for [facility] services. TOC will cont to follow.  Elvera Bicker with patient pathways notified that patient needs transportation to dialysis.    Expected Discharge Plan: [place] Barriers to Discharge: Continued Medical Work up   Patient Goals and CMS Choice Patient states their goals for this hospitalization and ongoing recovery are:: Getting dialysis CMS Medicare.gov Compare Post Acute Care list provided to:: Patient Choice offered to / list presented to : Patient  Expected Discharge Plan and Services Expected Discharge Plan: [place]   Discharge Planning Services: CM Consult Post Acute Care Choice: Home Health, Resumption of Svcs/PTA Provider Living arrangements for the past 2 months: Single Family Home                 DME Arranged: N/A DME Agency: NA       HH Arranged: RN, PT, OT [place] Agency:  ([facility] [place])        Prior Living Arrangements/Services Living arrangements for the past 2 months:  Single Family Home Lives with:: Self Patient language and need for interpreter reviewed:: Yes Do you feel safe going back to the place where you live?: Yes      Need for Family Participation in Patient Care: Yes (Comment) Care giver support system in place?: No (comment) Current home services: DME (walker and cane) Criminal Activity/Legal Involvement Pertinent to Current Situation/Hospitalization: No - Comment as needed  Activities of Daily Living      Permission Sought/Granted Permission sought to share information with : Case Manager, Family Supports, Other (comment) Permission granted to share information with : Yes, Verbal Permission Granted     Permission granted to share info w AGENCY: home health agencies        Emotional Assessment Appearance:: Appears stated age Attitude/Demeanor/Rapport: Engaged Affect (typically observed): Accepting Orientation: : Oriented to Self, Oriented to Place, Oriented to  Time, Oriented to Situation Alcohol / Substance Use: Not Applicable Psych Involvement: No (comment)  Admission diagnosis:  Weakness Patient Active Problem List   Diagnosis Date Noted   Debility 03/04/2021   ESRD (end stage renal disease) ([place]) 03/02/2021   Acute on chronic renal failure ([place]) 02/24/2021   Viral URI    Multifocal pneumonia 02/19/2021   Hydronephrosis with renal and ureteral calculus obstruction 02/19/2021   Hyperkalemia 02/19/2021   Cardiorenal syndrome, stage 1-4 or unspecified chronic kidney disease, with heart failure ([place]) 02/09/2021  Class: Hospitalized for   AKI (acute kidney injury) ([place]) 02/09/2021   Bronchitis 02/09/2021   Cardiorenal syndrome with renal failure 02/09/2021   Chronic respiratory failure with hypoxia ([place]) 01/13/2021   Physical deconditioning 03/16/2020   Abnormal findings on diagnostic imaging of lung 03/16/2020   AF (paroxysmal atrial fibrillation) (H[facility])    Acquired thrombophilia ([place])    Acute on chronic respiratory  failure with hypoxia ([place]) 02/21/2020   Acute on chronic respiratory failure with hypoxemia ([place]) 02/21/2020   Atrial fibrillation, chronic ([place]) 02/09/2020   Dyslipidemia 02/09/2020   Diabetes mellitus type 2 in obese ([place]) 02/09/2020   Acquired hypothyroidism 02/09/2020   Thrush, oral 02/09/2020   Morbid obesity with BMI of 60.0-69.9, adult ([place]) 02/03/2020   Chronic anticoagulation 12/30/2019   Obstructive sleep apnea 08/04/2019   Atrial fibrillation with RVR ([place]) 10/18/2018   Acute hypoxemic respiratory failure ([place]) 03/29/2018   Acute on chronic diastolic CHF (congestive heart failure) ([place]) 02/04/2018   Lymphedema 02/04/2018   Essential hypertension 12/09/2017   Long term (current) use of aromatase inhibitors 11/17/2017   Elevated alkaline phosphatase level 05/20/2017   Goals of care, counseling/discussion 05/14/2017   Chronic renal disease, stage III ([place]) 07/08/2015   Iron deficiency anemia 07/08/2015   Acute renal failure superimposed on stage 4 chronic kidney disease ([place]) 10/22/2014   History of breast cancer 10/22/2014   Elevated troponin 10/22/2014   Pulmonary emboli ([place]) 10/18/2014   History of pulmonary embolus (PE) 10/18/2014   Candidiasis of breast 07/06/2014   Malignant neoplasm of right female breast ([place]) 07/24/2012   PCP:  Lenard Simmer, MD Pharmacy:   [facility] - [address] [place] [address] Phone: [phone] Fax: [phone]  [facility] [address] [address] Phone: [phone] Fax: [phone]     Social Determinants of Health (SDOH) Interventions    Readmission Risk Interventions Readmission Risk Prevention Plan 02/11/2020 10/19/2018  Transportation Screening Complete Complete  PCP or Specialist Appt within 3-5 Days Not Complete -  Not Complete comments To be scheduled when discharged. -  [place] or Home Care Consult  Complete -  Social Work Consult for [place] Planning/Counseling Complete Patient refused  Palliative Care Screening Complete Not Applicable  Medication Review Press photographer) Complete Complete  Some recent data might be hidden

## 2021-03-18 NOTE — TOC Progression Note (Deleted)
Transition of Care [facility]) - Progression Note    Patient Details  Name: Nicole Schmidt MRN: [id number] Date of Birth: 1965-05-31  Transition of Care [facility]) CM/SW Contact  Shelbie Hutching, RN Phone Number: 03/18/2021, 1:11 PM  Clinical Narrative:    Checked with [facility] to see if they can accept- they are not in network with [employer].     Expected Discharge Plan: [place] Barriers to Discharge: Continued Medical Work up  Expected Discharge Plan and Services Expected Discharge Plan: [place]   Discharge Planning Services: CM Consult Post Acute Care Choice: Home Health, Resumption of Svcs/PTA Provider Living arrangements for the past 2 months: Single Family Home                 DME Arranged: N/A DME Agency: NA       HH Arranged: RN, PT, OT HH Agency:  ([facility] [place])         Social Determinants of Health (SDOH) Interventions    Readmission Risk Interventions Readmission Risk Prevention Plan 02/11/2020 10/19/2018  Transportation Screening Complete Complete  PCP or Specialist Appt within 3-5 Days Not Complete -  Not Complete comments To be scheduled when discharged. -  [place] or Home Care Consult Complete -  Social Work Consult for [place] Planning/Counseling Complete Patient refused  Palliative Care Screening Complete Not Applicable  Medication Review Press photographer) Complete Complete  Some recent data might be hidden

## 2021-03-18 NOTE — Progress Notes (Signed)
Hemodialysis notes  HD treatment completed, total time 3 hours. Tolerated well, pt asymptomatic, total UF net removed 2.5L

## 2021-03-18 NOTE — Progress Notes (Signed)
[facility]  ROUNDING NOTE   Subjective:   Nicole Schmidt is a 56 year old Caucasian female with past medical histories including diabetes, hypertension, hyperlipidemia, diastolic heart failure, hypothyroidism, atrial fibrillation, and acute kidney injury requiring dialysis.  Patient recently released from inpatient rehab and reports inability to enter her home with stairs.  According to patient during rehab she received therapy on stairs and was able to manage appropriately.  Patient states she received assistance from EMS to enter home after discharge, and with their instruction was encouraged to arrange nonmedical transport to assist with transportation to dialysis the next day.  After her dialysis treatment, due to her weakness she was brought to emergency department for evaluation.  Patient is known to our clinic and is followed outpatient by Dr. Zollie Scale.  She was recently started on hemodialysis during her previous admission.  She continued to receive inpatient dialysis during rehab and stated about 3 L UF removal at each treatment.  Once discharged, patient received first treatment at outpatient clinic and due to hypotension, center was only able to remove 1.7 L of fluid.  Patient states that she has continued to have swelling even during rehab stay.  Denies current diuretic use.  States she has monitored her fluid intake and salt restriction.  We were consulted to evaluate need for dialysis.   Objective:  Vital signs in last 24 hours:  Temp:  [97.6 F (36.4 C)-98.9 F (37.2 C)] 97.6 F (36.4 C) (02/03 1215) Pulse Rate:  [82-95] 93 (02/03 0905) Resp:  [16-26] 21 (02/03 1315) BP: (99-167)/(42-78) 99/52 (02/03 1315) SpO2:  [92 %-97 %] 97 % (02/03 1315) Weight:  [159.7 kg] 159.7 kg (02/02 1616)  Weight change:  Filed Weights   03/17/21 1616  Weight: (!) 159.7 kg    Intake/Output: No intake/output data recorded.   Intake/Output this shift:  No intake/output data  recorded.  Physical Exam: General: NAD, sitting in chair  Head: Normocephalic, atraumatic. Moist oral mucosal membranes  Eyes: Anicteric  Lungs:  Clear to auscultation, normal effort, [place] O2  Heart: Regular rate and rhythm  Abdomen:  Soft, nontender, obese  Extremities: 3+ peripheral edema.  Neurologic: Nonfocal, moving all four extremities  Skin: No lesions  Access: Right PermCath    Basic Metabolic Panel: Recent Labs  Lab 03/12/21 1524 03/16/21 0942 03/17/21 1618  NA 130* 132* 132*  K 3.9 4.5 3.5  CL 93* 97* 96*  CO2 27 26 29   GLUCOSE 174* 152* 119*  BUN 54* 38* 15  CREATININE 5.58* 4.50* 1.99*  CALCIUM 8.8* 8.2* 7.9*  PHOS 7.2* 4.6  --     Liver Function Tests: Recent Labs  Lab 03/12/21 1524 03/16/21 0942 03/17/21 1618  AST  --   --  27  ALT  --   --  25  ALKPHOS  --   --  94  BILITOT  --   --  0.4  PROT  --   --  7.3  ALBUMIN 2.5* 2.7* 3.2*   No results for input(s): LIPASE, AMYLASE in the last 168 hours. No results for input(s): AMMONIA in the last 168 hours.  CBC: Recent Labs  Lab 03/12/21 1524 03/15/21 1454 03/16/21 0943 03/17/21 1618  WBC 12.5* 10.9* 10.9* 13.0*  HGB 7.7* 7.4* 7.2* 8.3*  HCT 25.9* 25.7* 25.8* 28.9*  MCV 94.5 96.6 97.4 96.0  PLT 237 248 248 265    Cardiac Enzymes: No results for input(s): CKTOTAL, CKMB, CKMBINDEX, TROPONINI in the last 168 hours.  BNP:  Invalid input(s): POCBNP  CBG: Recent Labs  Lab 03/15/21 1834 03/15/21 2102 03/16/21 0554 03/16/21 1303 03/17/21 2208  GLUCAP 126* 160* 97 100* 182*    Microbiology: Results for orders placed or performed during the hospital encounter of 03/17/21  Resp Panel by RT-PCR (Flu A&B, Covid) Nasopharyngeal Swab     Status: None   Collection Time: 03/17/21  6:36 PM   Specimen: Nasopharyngeal Swab; Nasopharyngeal(NP) swabs in vial transport medium  Result Value Ref Range Status   SARS Coronavirus 2 by RT PCR NEGATIVE NEGATIVE Final    Comment: (NOTE) SARS-CoV-2 target  nucleic acids are NOT DETECTED.  The SARS-CoV-2 RNA is generally detectable in upper respiratory specimens during the acute phase of infection. The lowest concentration of SARS-CoV-2 viral copies this assay can detect is 138 copies/mL. A negative result does not preclude SARS-Cov-2 infection and should not be used as the sole basis for treatment or other patient management decisions. A negative result may occur with  improper specimen collection/handling, submission of specimen other than nasopharyngeal swab, presence of viral mutation(s) within the areas targeted by this assay, and inadequate number of viral copies(<138 copies/mL). A negative result must be combined with clinical observations, patient history, and epidemiological information. The expected result is Negative.  Fact Sheet for Patients:  EntrepreneurPulse.com.au  Fact Sheet for Healthcare Providers:  IncredibleEmployment.be  This test is no t yet approved or cleared by the [place] FDA and  has been authorized for detection and/or diagnosis of SARS-CoV-2 by FDA under an Emergency Use Authorization (EUA). This EUA will remain  in effect (meaning this test can be used) for the duration of the COVID-19 declaration under Section 564(b)(1) of the Act, 21 U.S.C.section 360bbb-3(b)(1), unless the authorization is terminated  or revoked sooner.       Influenza A by PCR NEGATIVE NEGATIVE Final   Influenza B by PCR NEGATIVE NEGATIVE Final    Comment: (NOTE) The Xpert Xpress SARS-CoV-2/FLU/RSV plus assay is intended as an aid in the diagnosis of influenza from Nasopharyngeal swab specimens and should not be used as a sole basis for treatment. Nasal washings and aspirates are unacceptable for Xpert Xpress SARS-CoV-2/FLU/RSV testing.  Fact Sheet for Patients: EntrepreneurPulse.com.au  Fact Sheet for Healthcare  Providers: IncredibleEmployment.be  This test is not yet approved or cleared by the [place] FDA and has been authorized for detection and/or diagnosis of SARS-CoV-2 by FDA under an Emergency Use Authorization (EUA). This EUA will remain in effect (meaning this test can be used) for the duration of the COVID-19 declaration under Section 564(b)(1) of the Act, 21 U.S.C. section 360bbb-3(b)(1), unless the authorization is terminated or revoked.  Performed at [facility], [address] [place], [place] [address]     Coagulation Studies: No results for input(s): LABPROT, INR in the last 72 hours.  Urinalysis: Recent Labs    03/17/21 1619  COLORURINE YELLOW*  LABSPEC 1.020  PHURINE 7.0  GLUCOSEU NEGATIVE  HGBUR LARGE*  BILIRUBINUR NEGATIVE  KETONESUR TRACE*  PROTEINUR >300*  NITRITE NEGATIVE  LEUKOCYTESUR LARGE*      Imaging: No results found.   Medications:    sodium chloride     sodium chloride      amLODipine  5 mg Oral Daily   apixaban  5 mg Oral BID   atorvastatin  20 mg Oral QHS   calcitRIOL  0.25 mcg Oral Daily   calcium carbonate  1 tablet Oral Daily   Chlorhexidine Gluconate Cloth  6 each Topical Q0600  cloNIDine  0.2 mg Oral BID   Dulaglutide  1.5 mg Subcutaneous Q7 days   folic acid  5,631 mcg Oral Daily   gabapentin  300 mg Oral QHS   Gerhardt's butt cream  3 application Topical TID   levothyroxine  112 mcg Oral QAC breakfast   metoprolol tartrate  100 mg Oral BID   sevelamer carbonate  800 mg Oral TID WC   [START ON 03/23/2021] Vitamin D (Ergocalciferol)  50,000 Units Oral Weekly   sodium chloride, sodium chloride, acetaminophen, albuterol, alteplase, heparin, lidocaine (PF), lidocaine-prilocaine, melatonin, pentafluoroprop-tetrafluoroeth, traMADol  Assessment/ Plan:  Ms. TAL NEER is a 56 y.o.  female  with hypertension, hyperlipidemia, diabetes mellitus type II, diastolic congestive heart failure,  hypothyroidism, history of breast cancer, atrial fibrillation and pulmonary embolism was seen at [facility] ED for Weakness  [facility]/ TTS/ Rt Permcath/166 kg  Fluid volume overload with end-stage renal disease requiring hemodialysis.  Due to patient's history and lack of sustainable renal recovery, we feel patient has now progressed to end-stage renal disease requiring long-term hemodialysis.  We will provide a sequential only dialysis treatment today with UF goal 2.5 L.  We will monitor blood pressure closely.  Patient reports she makes a small amount of urine but based on low blood pressures, BP management would be best monitored during dialysis.  Next treatment scheduled for tomorrow.  If remains inpatient, will perform daily dialysis to manage fluid overload.  2. Anemia of chronic kidney disease Lab Results  Component Value Date   HGB 8.3 (L) 03/17/2021    Hgb below target, will order Epo with treatment.  3. Secondary Hyperparathyroidism:  Lab Results  Component Value Date   PTH 94 (H) 11/18/2017   CALCIUM 7.9 (L) 03/17/2021   PHOS 4.6 03/16/2021    We will continue to monitor bone mineral metabolism during this admission.   LOS: 0 Kajuana Shareef 2/3/20231:30 PM

## 2021-03-19 DIAGNOSIS — N186 End stage renal disease: Secondary | ICD-10-CM | POA: Diagnosis not present

## 2021-03-19 DIAGNOSIS — I509 Heart failure, unspecified: Secondary | ICD-10-CM | POA: Diagnosis not present

## 2021-03-19 DIAGNOSIS — D631 Anemia in chronic kidney disease: Secondary | ICD-10-CM | POA: Diagnosis not present

## 2021-03-19 DIAGNOSIS — E8770 Fluid overload, unspecified: Secondary | ICD-10-CM | POA: Diagnosis not present

## 2021-03-19 LAB — HEPATITIS B SURFACE ANTIBODY, QUANTITATIVE: Hep B S AB Quant (Post): <3.1 m[IU]/mL — ABNORMAL LOW (ref >9.9)

## 2021-03-19 MED ORDER — DULAGLUTIDE 1.5 MG/0.5ML ~~LOC~~ SOAJ: 1.5000 mg | SUBCUTANEOUS | Status: DC

## 2021-03-19 MED ORDER — HEPARIN SODIUM (PORCINE) 1000 UNIT/ML IJ SOLN
INTRAMUSCULAR | Status: AC
  Filled 2021-03-19: qty 10

## 2021-03-19 NOTE — ED Notes (Signed)
Pt transported to dialysis

## 2021-03-19 NOTE — ED Notes (Signed)
RN rounded on pt. Pt is currently resting with family t bedside. Respirations and even and unlabored.

## 2021-03-19 NOTE — ED Notes (Signed)
Back from HD

## 2021-03-19 NOTE — Progress Notes (Signed)
[facility]  Dialysis Note   Subjective:   Seen and examined on hemodialysis treatment.  Tolerating dialysis with 2 L fluid removal with sequential ultrafiltration Blood pressure has been stable. She denies any chest pain.   HEMODIALYSIS FLOWSHEET:  Blood Flow Rate (mL/min): 400 mL/min Arterial Pressure (mmHg): -150 mmHg Venous Pressure (mmHg): 140 mmHg Transmembrane Pressure (mmHg): 50 mmHg Ultrafiltration Rate (mL/min): 0 mL/min Dialysate Flow Rate (mL/min): 500 ml/min Conductivity: Machine : 13.8 Conductivity: Machine : 13.8 Dialysis Fluid Bolus: Normal Saline Bolus Amount (mL): 250 mL    Objective:  Vital signs in last 24 hours:  Temp:  [97.7 F (36.5 C)-99.2 F (37.3 C)] 99.2 F (37.3 C) (02/04 0855) Pulse Rate:  [71-90] 88 (02/04 0932) Resp:  [16-26] 24 (02/04 0932) BP: (89-124)/(33-112) 96/35 (02/04 1130) SpO2:  [92 %-100 %] 94 % (02/04 0942)  Weight change:  Filed Weights   03/17/21 1616  Weight: (!) 159.7 kg    Intake/Output: I/O last 3 completed shifts: In: -  Out: 2500 [Other:2500]   Intake/Output this shift:  No intake/output data recorded.  Physical Exam: General: NAD,   Head: Normocephalic, atraumatic. Moist oral mucosal membranes  Eyes: Anicteric, PERRL  Neck: Supple, trachea midline  Lungs:  Clear to auscultation  Heart: Regular rate and rhythm  Abdomen:  Soft, nontender,   Extremities: 2+ peripheral edema.  Neurologic: Nonfocal, moving all four extremities  Skin: No lesions  Access:     Basic Metabolic Panel: Recent Labs  Lab 03/12/21 1524 03/16/21 0942 03/17/21 1618  NA 130* 132* 132*  K 3.9 4.5 3.5  CL 93* 97* 96*  CO2 27 26 29   GLUCOSE 174* 152* 119*  BUN 54* 38* 15  CREATININE 5.58* 4.50* 1.99*  CALCIUM 8.8* 8.2* 7.9*  PHOS 7.2* 4.6  --     Liver Function Tests: Recent Labs  Lab 03/12/21 1524 03/16/21 0942 03/17/21 1618  AST  --   --  27  ALT  --   --  25  ALKPHOS  --   --  94  BILITOT  --   --   0.4  PROT  --   --  7.3  ALBUMIN 2.5* 2.7* 3.2*   No results for input(s): LIPASE, AMYLASE in the last 168 hours. No results for input(s): AMMONIA in the last 168 hours.  CBC: Recent Labs  Lab 03/12/21 1524 03/15/21 1454 03/16/21 0943 03/17/21 1618  WBC 12.5* 10.9* 10.9* 13.0*  HGB 7.7* 7.4* 7.2* 8.3*  HCT 25.9* 25.7* 25.8* 28.9*  MCV 94.5 96.6 97.4 96.0  PLT 237 248 248 265    Cardiac Enzymes: No results for input(s): CKTOTAL, CKMB, CKMBINDEX, TROPONINI in the last 168 hours.  BNP: Invalid input(s): POCBNP  CBG: Recent Labs  Lab 03/15/21 1834 03/15/21 2102 03/16/21 0554 03/16/21 1303 03/17/21 2208  GLUCAP 126* 160* 97 100* 182*    Microbiology: Results for orders placed or performed during the hospital encounter of 03/17/21  Resp Panel by RT-PCR (Flu A&B, Covid) Nasopharyngeal Swab     Status: None   Collection Time: 03/17/21  6:36 PM   Specimen: Nasopharyngeal Swab; Nasopharyngeal(NP) swabs in vial transport medium  Result Value Ref Range Status   SARS Coronavirus 2 by RT PCR NEGATIVE NEGATIVE Final    Comment: (NOTE) SARS-CoV-2 target nucleic acids are NOT DETECTED.  The SARS-CoV-2 RNA is generally detectable in upper respiratory specimens during the acute phase of infection. The lowest concentration of SARS-CoV-2 viral copies this assay can detect is 138  copies/mL. A negative result does not preclude SARS-Cov-2 infection and should not be used as the sole basis for treatment or other patient management decisions. A negative result may occur with  improper specimen collection/handling, submission of specimen other than nasopharyngeal swab, presence of viral mutation(s) within the areas targeted by this assay, and inadequate number of viral copies(<138 copies/mL). A negative result must be combined with clinical observations, patient history, and epidemiological information. The expected result is Negative.  Fact Sheet for Patients:   EntrepreneurPulse.com.au  Fact Sheet for Healthcare Providers:  IncredibleEmployment.be  This test is no t yet approved or cleared by the [place] FDA and  has been authorized for detection and/or diagnosis of SARS-CoV-2 by FDA under an Emergency Use Authorization (EUA). This EUA will remain  in effect (meaning this test can be used) for the duration of the COVID-19 declaration under Section 564(b)(1) of the Act, 21 U.S.C.section 360bbb-3(b)(1), unless the authorization is terminated  or revoked sooner.       Influenza A by PCR NEGATIVE NEGATIVE Final   Influenza B by PCR NEGATIVE NEGATIVE Final    Comment: (NOTE) The Xpert Xpress SARS-CoV-2/FLU/RSV plus assay is intended as an aid in the diagnosis of influenza from Nasopharyngeal swab specimens and should not be used as a sole basis for treatment. Nasal washings and aspirates are unacceptable for Xpert Xpress SARS-CoV-2/FLU/RSV testing.  Fact Sheet for Patients: EntrepreneurPulse.com.au  Fact Sheet for Healthcare Providers: IncredibleEmployment.be  This test is not yet approved or cleared by the [place] FDA and has been authorized for detection and/or diagnosis of SARS-CoV-2 by FDA under an Emergency Use Authorization (EUA). This EUA will remain in effect (meaning this test can be used) for the duration of the COVID-19 declaration under Section 564(b)(1) of the Act, 21 U.S.C. section 360bbb-3(b)(1), unless the authorization is terminated or revoked.  Performed at [facility], [address] [place], [place] [address]     Coagulation Studies: No results for input(s): LABPROT, INR in the last 72 hours.  Urinalysis: Recent Labs    03/17/21 1619  COLORURINE YELLOW*  LABSPEC 1.020  PHURINE 7.0  GLUCOSEU NEGATIVE  HGBUR LARGE*  BILIRUBINUR NEGATIVE  KETONESUR TRACE*  PROTEINUR >300*  NITRITE NEGATIVE  LEUKOCYTESUR  LARGE*      Imaging: No results found.   Medications:    sodium chloride     sodium chloride      amLODipine  5 mg Oral Daily   apixaban  5 mg Oral BID   atorvastatin  20 mg Oral QHS   calcitRIOL  0.25 mcg Oral Daily   calcium carbonate  1 tablet Oral Daily   Chlorhexidine Gluconate Cloth  6 each Topical Q0600   cloNIDine  0.2 mg Oral BID   Dulaglutide  1.5 mg Subcutaneous Q7 days   folic acid  8,466 mcg Oral Daily   gabapentin  300 mg Oral QHS   Gerhardt's butt cream  3 application Topical TID   heparin sodium (porcine)       levothyroxine  112 mcg Oral QAC breakfast   metoprolol tartrate  100 mg Oral BID   sevelamer carbonate  800 mg Oral TID WC   [START ON 03/23/2021] Vitamin D (Ergocalciferol)  50,000 Units Oral Weekly   sodium chloride, sodium chloride, acetaminophen, albuterol, alteplase, heparin, lidocaine (PF), lidocaine-prilocaine, melatonin, pentafluoroprop-tetrafluoroeth, traMADol  Assessment/ Plan:  [name] is a 56 y.o.  female morbid obesity, diabetes, peripheral vascular disease, hypertension, coronary artery disease, congestive heart  failure, atrial fibrillation, acute kidney injury on top of chronic kidney disease was recently initiated on dialysis.  Patient has been daily dialysis for fluid removal.  Active Problems:    #1: ESRD #2: Hypertension/CHF #3: Diabetes #4: Anemia   End Stage Renal Disease on hemodialysis: Will attempt fluid removal as tolerated.  Intake/Output Summary (Last 24 hours) at 03/19/2021 1242 Last data filed at 03/18/2021 1530 Gross per 24 hour  Intake --  Output 2500 ml  Net -2500 ml    2. Hypertension with chronic kidney disease: Continue to monitor closely.   BP (!) 96/35    Pulse 88    Temp 99.2 F (37.3 C) (Oral)    Resp (!) 24    Ht 5\' 4"  (1.626 m)    Wt (!) 159.7 kg    LMP 07/17/2012    SpO2 94%    BMI 60.42 kg/m   3. Anemia of chronic kidney disease/ kidney injury/chronic disease/acute blood loss:   Lab  Results  Component Value Date   HGB 8.3 (L) 03/17/2021  Will follow protocol.  4. Secondary Hyperparathyroidism: On sevelamer for hyper phosphatemia.   Lab Results  Component Value Date   PTH 94 (H) 11/18/2017   CALCIUM 7.9 (L) 03/17/2021   PHOS 4.6 03/16/2021   We will continue to monitor closely during hospitalization.     LOS: 0 Lyla Son, MD [facility] 2/4/202312:42 PM

## 2021-03-19 NOTE — ED Notes (Signed)
Rn rounded on pt. Pt is currently resting with family at bedside. Respiration are even and unlabored. Pt NAD at this time.

## 2021-03-19 NOTE — Progress Notes (Signed)
OT Cancellation Note  Patient Details Name: LEIYAH MAULTSBY MRN: [id number] DOB: [birth year]   Cancelled Treatment:    Reason Eval/Treat Not Completed: Patient at procedure or test/ unavailable Pt OTF to HD at this time. Will f/u for OT evaluation at later date/time as able.  Gerrianne Scale, [place], OTR/L ascom [phone] 03/19/21, 12:04 PM

## 2021-03-20 ENCOUNTER — Emergency Department: Payer: BC Managed Care – PPO

## 2021-03-20 ENCOUNTER — Inpatient Hospital Stay: Payer: BC Managed Care – PPO

## 2021-03-20 ENCOUNTER — Encounter: Payer: Self-pay | Admitting: Emergency Medicine

## 2021-03-20 DIAGNOSIS — E039 Hypothyroidism, unspecified: Secondary | ICD-10-CM | POA: Diagnosis present

## 2021-03-20 DIAGNOSIS — N186 End stage renal disease: Secondary | ICD-10-CM

## 2021-03-20 DIAGNOSIS — N136 Pyonephrosis: Secondary | ICD-10-CM | POA: Diagnosis present

## 2021-03-20 DIAGNOSIS — J9622 Acute and chronic respiratory failure with hypercapnia: Secondary | ICD-10-CM

## 2021-03-20 DIAGNOSIS — E038 Other specified hypothyroidism: Secondary | ICD-10-CM | POA: Diagnosis not present

## 2021-03-20 DIAGNOSIS — A499 Bacterial infection, unspecified: Secondary | ICD-10-CM

## 2021-03-20 DIAGNOSIS — G9340 Encephalopathy, unspecified: Secondary | ICD-10-CM | POA: Diagnosis present

## 2021-03-20 DIAGNOSIS — I5033 Acute on chronic diastolic (congestive) heart failure: Secondary | ICD-10-CM | POA: Diagnosis present

## 2021-03-20 DIAGNOSIS — R0902 Hypoxemia: Secondary | ICD-10-CM | POA: Diagnosis not present

## 2021-03-20 DIAGNOSIS — I5031 Acute diastolic (congestive) heart failure: Secondary | ICD-10-CM

## 2021-03-20 DIAGNOSIS — J9621 Acute and chronic respiratory failure with hypoxia: Secondary | ICD-10-CM | POA: Diagnosis present

## 2021-03-20 DIAGNOSIS — I132 Hypertensive heart and chronic kidney disease with heart failure and with stage 5 chronic kidney disease, or end stage renal disease: Secondary | ICD-10-CM | POA: Diagnosis present

## 2021-03-20 DIAGNOSIS — E1122 Type 2 diabetes mellitus with diabetic chronic kidney disease: Secondary | ICD-10-CM | POA: Diagnosis present

## 2021-03-20 DIAGNOSIS — D62 Acute posthemorrhagic anemia: Secondary | ICD-10-CM | POA: Diagnosis not present

## 2021-03-20 DIAGNOSIS — I4819 Other persistent atrial fibrillation: Secondary | ICD-10-CM | POA: Diagnosis present

## 2021-03-20 DIAGNOSIS — E049 Nontoxic goiter, unspecified: Secondary | ICD-10-CM | POA: Diagnosis present

## 2021-03-20 DIAGNOSIS — I2609 Other pulmonary embolism with acute cor pulmonale: Secondary | ICD-10-CM

## 2021-03-20 DIAGNOSIS — F32A Depression, unspecified: Secondary | ICD-10-CM | POA: Diagnosis present

## 2021-03-20 DIAGNOSIS — Z20822 Contact with and (suspected) exposure to covid-19: Secondary | ICD-10-CM | POA: Diagnosis present

## 2021-03-20 DIAGNOSIS — I480 Paroxysmal atrial fibrillation: Secondary | ICD-10-CM | POA: Diagnosis not present

## 2021-03-20 DIAGNOSIS — I2699 Other pulmonary embolism without acute cor pulmonale: Secondary | ICD-10-CM | POA: Diagnosis present

## 2021-03-20 DIAGNOSIS — E871 Hypo-osmolality and hyponatremia: Secondary | ICD-10-CM | POA: Diagnosis present

## 2021-03-20 DIAGNOSIS — R5381 Other malaise: Secondary | ICD-10-CM | POA: Diagnosis not present

## 2021-03-20 DIAGNOSIS — Z992 Dependence on renal dialysis: Secondary | ICD-10-CM | POA: Diagnosis not present

## 2021-03-20 DIAGNOSIS — I959 Hypotension, unspecified: Secondary | ICD-10-CM | POA: Diagnosis present

## 2021-03-20 DIAGNOSIS — J9601 Acute respiratory failure with hypoxia: Secondary | ICD-10-CM | POA: Diagnosis not present

## 2021-03-20 DIAGNOSIS — G4733 Obstructive sleep apnea (adult) (pediatric): Secondary | ICD-10-CM | POA: Diagnosis not present

## 2021-03-20 DIAGNOSIS — D631 Anemia in chronic kidney disease: Secondary | ICD-10-CM | POA: Diagnosis present

## 2021-03-20 DIAGNOSIS — E662 Morbid (severe) obesity with alveolar hypoventilation: Secondary | ICD-10-CM | POA: Diagnosis present

## 2021-03-20 DIAGNOSIS — Z1612 Extended spectrum beta lactamase (ESBL) resistance: Secondary | ICD-10-CM | POA: Diagnosis present

## 2021-03-20 DIAGNOSIS — E1165 Type 2 diabetes mellitus with hyperglycemia: Secondary | ICD-10-CM | POA: Diagnosis not present

## 2021-03-20 DIAGNOSIS — L89152 Pressure ulcer of sacral region, stage 2: Secondary | ICD-10-CM | POA: Diagnosis present

## 2021-03-20 DIAGNOSIS — Z6841 Body Mass Index (BMI) 40.0 and over, adult: Secondary | ICD-10-CM | POA: Diagnosis not present

## 2021-03-20 DIAGNOSIS — E8729 Other acidosis: Secondary | ICD-10-CM | POA: Diagnosis present

## 2021-03-20 DIAGNOSIS — R262 Difficulty in walking, not elsewhere classified: Secondary | ICD-10-CM | POA: Diagnosis not present

## 2021-03-20 DIAGNOSIS — N390 Urinary tract infection, site not specified: Secondary | ICD-10-CM | POA: Diagnosis not present

## 2021-03-20 DIAGNOSIS — E669 Obesity, unspecified: Secondary | ICD-10-CM | POA: Diagnosis not present

## 2021-03-20 HISTORY — DX: Extended spectrum beta lactamase (ESBL) resistance: A49.9

## 2021-03-20 LAB — BLOOD GAS, VENOUS
Acid-Base Excess: 0.4 mmol/L (ref 0.0–2.0)
Acid-base deficit: 0.9 mmol/L (ref 0.0–2.0)
Bicarbonate: 30.1 mmol/L — ABNORMAL HIGH (ref 20.0–28.0)
Bicarbonate: 30.3 mmol/L — ABNORMAL HIGH (ref 20.0–28.0)
Delivery systems: POSITIVE
FIO2: 1
FIO2: 0.45
Mechanical Rate: 10
O2 Saturation: 72.1 %
O2 Saturation: 63.7 %
PEEP: 8 cmH2O
Patient temperature: 37
Patient temperature: 37
Pressure support: 18 cmH2O
RATE: 10 resp/min
pCO2, Ven: 104 mmHg (ref 44.0–60.0)
pCO2, Ven: 89 mmHg (ref 44.0–60.0)
pH, Ven: 7.07 — CL (ref 7.250–7.430)
pH, Ven: 7.14 — CL (ref 7.250–7.430)
pO2, Ven: 54 mmHg — ABNORMAL HIGH (ref 32.0–45.0)
pO2, Ven: 44 mmHg (ref 32.0–45.0)

## 2021-03-20 LAB — CBC WITH DIFFERENTIAL/PLATELET
Abs Immature Granulocytes: 0.66 10*3/uL — ABNORMAL HIGH (ref 0.00–0.07)
Basophils Absolute: 0.1 10*3/uL (ref 0.0–0.1)
Basophils Relative: 0 %
Eosinophils Absolute: 0.1 10*3/uL (ref 0.0–0.5)
Eosinophils Relative: 0 %
HCT: 29.3 % — ABNORMAL LOW (ref 36.0–46.0)
Hemoglobin: 8 g/dL — ABNORMAL LOW (ref 12.0–15.0)
Immature Granulocytes: 4 %
Lymphocytes Relative: 5 %
Lymphs Abs: 0.8 10*3/uL (ref 0.7–4.0)
MCH: 27.7 pg (ref 26.0–34.0)
MCHC: 27.3 g/dL — ABNORMAL LOW (ref 30.0–36.0)
MCV: 101.4 fL — ABNORMAL HIGH (ref 80.0–100.0)
Monocytes Absolute: 2.3 10*3/uL — ABNORMAL HIGH (ref 0.1–1.0)
Monocytes Relative: 13 %
Neutro Abs: 13.5 10*3/uL — ABNORMAL HIGH (ref 1.7–7.7)
Neutrophils Relative %: 78 %
Platelets: 266 10*3/uL (ref 150–400)
RBC: 2.89 MIL/uL — ABNORMAL LOW (ref 3.87–5.11)
RDW: 16.3 % — ABNORMAL HIGH (ref 11.5–15.5)
WBC: 17.4 10*3/uL — ABNORMAL HIGH (ref 4.0–10.5)
nRBC: 0.9 % — ABNORMAL HIGH (ref 0.0–0.2)

## 2021-03-20 LAB — COMPREHENSIVE METABOLIC PANEL
ALT: 22 U/L (ref 0–44)
AST: 20 U/L (ref 15–41)
Albumin: 3.1 g/dL — ABNORMAL LOW (ref 3.5–5.0)
Alkaline Phosphatase: 92 U/L (ref 38–126)
Anion gap: 7 (ref 5–15)
BUN: 36 mg/dL — ABNORMAL HIGH (ref 6–20)
CO2: 30 mmol/L (ref 22–32)
Calcium: 8.7 mg/dL — ABNORMAL LOW (ref 8.9–10.3)
Chloride: 97 mmol/L — ABNORMAL LOW (ref 98–111)
Creatinine, Ser: 4.52 mg/dL — ABNORMAL HIGH (ref 0.44–1.00)
GFR, Estimated: 11 mL/min — ABNORMAL LOW (ref >60)
Glucose, Bld: 132 mg/dL — ABNORMAL HIGH (ref 70–99)
Potassium: 5.3 mmol/L — ABNORMAL HIGH (ref 3.5–5.1)
Sodium: 134 mmol/L — ABNORMAL LOW (ref 135–145)
Total Bilirubin: 0.4 mg/dL (ref 0.3–1.2)
Total Protein: 7.2 g/dL (ref 6.5–8.1)

## 2021-03-20 LAB — CBC
HCT: 27.6 % — ABNORMAL LOW (ref 36.0–46.0)
HCT: 27.9 % — ABNORMAL LOW (ref 36.0–46.0)
Hemoglobin: 7.6 g/dL — ABNORMAL LOW (ref 12.0–15.0)
Hemoglobin: 7.8 g/dL — ABNORMAL LOW (ref 12.0–15.0)
MCH: 28 pg (ref 26.0–34.0)
MCH: 28 pg (ref 26.0–34.0)
MCHC: 27.5 g/dL — ABNORMAL LOW (ref 30.0–36.0)
MCHC: 28 g/dL — ABNORMAL LOW (ref 30.0–36.0)
MCV: 101.8 fL — ABNORMAL HIGH (ref 80.0–100.0)
MCV: 100 fL (ref 80.0–100.0)
Platelets: 234 10*3/uL (ref 150–400)
Platelets: 244 10*3/uL (ref 150–400)
RBC: 2.71 MIL/uL — ABNORMAL LOW (ref 3.87–5.11)
RBC: 2.79 MIL/uL — ABNORMAL LOW (ref 3.87–5.11)
RDW: 16.5 % — ABNORMAL HIGH (ref 11.5–15.5)
RDW: 16.3 % — ABNORMAL HIGH (ref 11.5–15.5)
WBC: 14.6 10*3/uL — ABNORMAL HIGH (ref 4.0–10.5)
WBC: 14.4 10*3/uL — ABNORMAL HIGH (ref 4.0–10.5)
nRBC: 0.7 % — ABNORMAL HIGH (ref 0.0–0.2)
nRBC: 0.7 % — ABNORMAL HIGH (ref 0.0–0.2)

## 2021-03-20 LAB — GLUCOSE, CAPILLARY
Glucose-Capillary: 101 mg/dL — ABNORMAL HIGH (ref 70–99)
Glucose-Capillary: 84 mg/dL (ref 70–99)
Glucose-Capillary: 87 mg/dL (ref 70–99)
Glucose-Capillary: 88 mg/dL (ref 70–99)

## 2021-03-20 LAB — RENAL FUNCTION PANEL
Albumin: 2.8 g/dL — ABNORMAL LOW (ref 3.5–5.0)
Anion gap: 9 (ref 5–15)
BUN: 40 mg/dL — ABNORMAL HIGH (ref 6–20)
CO2: 27 mmol/L (ref 22–32)
Calcium: 8.8 mg/dL — ABNORMAL LOW (ref 8.9–10.3)
Chloride: 97 mmol/L — ABNORMAL LOW (ref 98–111)
Creatinine, Ser: 4.57 mg/dL — ABNORMAL HIGH (ref 0.44–1.00)
GFR, Estimated: 11 mL/min — ABNORMAL LOW (ref >60)
Glucose, Bld: 105 mg/dL — ABNORMAL HIGH (ref 70–99)
Phosphorus: 6.4 mg/dL — ABNORMAL HIGH (ref 2.5–4.6)
Potassium: 4.8 mmol/L (ref 3.5–5.1)
Sodium: 133 mmol/L — ABNORMAL LOW (ref 135–145)

## 2021-03-20 LAB — RESP PANEL BY RT-PCR (FLU A&B, COVID) ARPGX2
Influenza A by PCR: NEGATIVE
Influenza B by PCR: NEGATIVE
SARS Coronavirus 2 by RT PCR: NEGATIVE

## 2021-03-20 LAB — MRSA NEXT GEN BY PCR, NASAL: MRSA by PCR Next Gen: NOT DETECTED

## 2021-03-20 LAB — TROPONIN I (HIGH SENSITIVITY)
Troponin I (High Sensitivity): 15 ng/L (ref <18)
Troponin I (High Sensitivity): 15 ng/L (ref <18)

## 2021-03-20 LAB — CBG MONITORING, ED: Glucose-Capillary: 118 mg/dL — ABNORMAL HIGH (ref 70–99)

## 2021-03-20 LAB — MAGNESIUM: Magnesium: 2 mg/dL (ref 1.7–2.4)

## 2021-03-20 LAB — LACTIC ACID, PLASMA
Lactic Acid, Venous: 0.7 mmol/L (ref 0.5–1.9)
Lactic Acid, Venous: 0.9 mmol/L (ref 0.5–1.9)

## 2021-03-20 LAB — PROCALCITONIN: Procalcitonin: 0.32 ng/mL

## 2021-03-20 LAB — CREATININE, SERUM
Creatinine, Ser: 4.54 mg/dL — ABNORMAL HIGH (ref 0.44–1.00)
GFR, Estimated: 11 mL/min — ABNORMAL LOW (ref >60)

## 2021-03-20 LAB — BRAIN NATRIURETIC PEPTIDE: B Natriuretic Peptide: 576.9 pg/mL — ABNORMAL HIGH (ref 0.0–100.0)

## 2021-03-20 MED ORDER — CHLORHEXIDINE GLUCONATE 0.12 % MT SOLN
15.0000 mL | Freq: Two times a day (BID) | OROMUCOSAL | Status: DC
  Administered 2021-03-20 – 2021-03-28 (×15): 15 mL via OROMUCOSAL
  Filled 2021-03-20 (×14): qty 15

## 2021-03-20 MED ORDER — VANCOMYCIN HCL IN DEXTROSE 1-5 GM/200ML-% IV SOLN
1000.0000 mg | Freq: Once | INTRAVENOUS | Status: AC
  Administered 2021-03-20: 1000 mg via INTRAVENOUS
  Filled 2021-03-20: qty 200

## 2021-03-20 MED ORDER — PENTAFLUOROPROP-TETRAFLUOROETH EX AERO
1.0000 "application " | INHALATION_SPRAY | CUTANEOUS | Status: DC | PRN
  Filled 2021-03-20: qty 30

## 2021-03-20 MED ORDER — SODIUM CHLORIDE 0.9% FLUSH: 3.0000 mL | INTRAVENOUS | Status: DC | PRN

## 2021-03-20 MED ORDER — SODIUM CHLORIDE 0.9% FLUSH
3.0000 mL | Freq: Two times a day (BID) | INTRAVENOUS | Status: DC
  Administered 2021-03-20 – 2021-03-27 (×16): 3 mL via INTRAVENOUS

## 2021-03-20 MED ORDER — CLONIDINE HCL 0.1 MG PO TABS
0.1000 mg | ORAL_TABLET | Freq: Every day | ORAL | Status: DC
  Administered 2021-03-21 – 2021-03-23 (×3): 0.1 mg via ORAL
  Filled 2021-03-20 (×4): qty 1

## 2021-03-20 MED ORDER — SODIUM CHLORIDE 0.9 % IV SOLN
250.0000 mL | INTRAVENOUS | Status: DC | PRN
  Administered 2021-03-20: 250 mL via INTRAVENOUS

## 2021-03-20 MED ORDER — VANCOMYCIN HCL 1500 MG/300ML IV SOLN
1500.0000 mg | Freq: Once | INTRAVENOUS | Status: AC
  Administered 2021-03-20: 1500 mg via INTRAVENOUS
  Filled 2021-03-20: qty 300

## 2021-03-20 MED ORDER — ACETAMINOPHEN 325 MG PO TABS: 650.0000 mg | ORAL_TABLET | ORAL | Status: DC | PRN

## 2021-03-20 MED ORDER — INSULIN ASPART 100 UNIT/ML IJ SOLN: 0.0000 [IU] | INTRAMUSCULAR | Status: DC

## 2021-03-20 MED ORDER — VANCOMYCIN HCL IN DEXTROSE 1-5 GM/200ML-% IV SOLN: 1000.0000 mg | Freq: Once | INTRAVENOUS | Status: DC

## 2021-03-20 MED ORDER — SODIUM CHLORIDE 0.9 % IV SOLN
1.0000 g | INTRAVENOUS | Status: DC
  Filled 2021-03-20: qty 10

## 2021-03-20 MED ORDER — POLYETHYLENE GLYCOL 3350 17 G PO PACK
17.0000 g | PACK | Freq: Every day | ORAL | Status: DC | PRN
  Administered 2021-03-23: 17 g via ORAL
  Filled 2021-03-20: qty 1

## 2021-03-20 MED ORDER — HEPARIN SODIUM (PORCINE) 5000 UNIT/ML IJ SOLN
5000.0000 [IU] | Freq: Three times a day (TID) | INTRAMUSCULAR | Status: DC
  Administered 2021-03-20 – 2021-03-21 (×2): 5000 [IU] via SUBCUTANEOUS
  Filled 2021-03-20 (×2): qty 1

## 2021-03-20 MED ORDER — SODIUM CHLORIDE 0.9 % IV SOLN
2.0000 g | Freq: Once | INTRAVENOUS | Status: AC
  Administered 2021-03-20: 2 g via INTRAVENOUS
  Filled 2021-03-20: qty 20

## 2021-03-20 MED ORDER — SODIUM CHLORIDE 0.9 % IV SOLN: 100.0000 mL | INTRAVENOUS | Status: DC | PRN

## 2021-03-20 MED ORDER — LIDOCAINE-PRILOCAINE 2.5-2.5 % EX CREA
1.0000 "application " | TOPICAL_CREAM | CUTANEOUS | Status: DC | PRN
  Filled 2021-03-20: qty 5

## 2021-03-20 MED ORDER — HEPARIN SODIUM (PORCINE) 1000 UNIT/ML IJ SOLN
INTRAMUSCULAR | Status: AC
  Filled 2021-03-20: qty 10

## 2021-03-20 MED ORDER — ALTEPLASE 2 MG IJ SOLR
2.0000 mg | Freq: Once | INTRAMUSCULAR | Status: DC | PRN
  Filled 2021-03-20: qty 2

## 2021-03-20 MED ORDER — HEPARIN SODIUM (PORCINE) 1000 UNIT/ML DIALYSIS
1000.0000 [IU] | INTRAMUSCULAR | Status: DC | PRN
  Filled 2021-03-20: qty 1

## 2021-03-20 MED ORDER — FAMOTIDINE IN NACL 20-0.9 MG/50ML-% IV SOLN
20.0000 mg | INTRAVENOUS | Status: DC
  Administered 2021-03-20: 20 mg via INTRAVENOUS
  Filled 2021-03-20: qty 50

## 2021-03-20 MED ORDER — ONDANSETRON HCL 4 MG/2ML IJ SOLN: 4.0000 mg | Freq: Four times a day (QID) | INTRAMUSCULAR | Status: DC | PRN

## 2021-03-20 MED ORDER — ORAL CARE MOUTH RINSE
15.0000 mL | Freq: Two times a day (BID) | OROMUCOSAL | Status: DC
  Administered 2021-03-20 – 2021-03-27 (×11): 15 mL via OROMUCOSAL

## 2021-03-20 MED ORDER — DOCUSATE SODIUM 100 MG PO CAPS: 100.0000 mg | ORAL_CAPSULE | Freq: Two times a day (BID) | ORAL | Status: DC | PRN

## 2021-03-20 MED ORDER — LIDOCAINE HCL (PF) 1 % IJ SOLN
5.0000 mL | INTRAMUSCULAR | Status: DC | PRN
  Filled 2021-03-20: qty 5

## 2021-03-20 MED ORDER — CHLORHEXIDINE GLUCONATE CLOTH 2 % EX PADS
6.0000 | MEDICATED_PAD | Freq: Every day | CUTANEOUS | Status: DC
  Administered 2021-03-22: 6 via TOPICAL

## 2021-03-20 NOTE — ED Notes (Signed)
Patient resting comfortably in recliner in room asleep. RR even and unlabored. Patient remains on 6lpm oxygen via [place] and her sats remain 88-92%. Her arms are dangling beside her in the recliner. Spoke with charge nurse regarding patient's sats and we both believe that it is in part due to the position of her arms and her position in the recliner but patient does not wish to get into the bed. Will continue to monitor patient.

## 2021-03-20 NOTE — ED Notes (Addendum)
RN rounded on pt. Pt resting at this time. Pt is NAD. Respirations even and unlabored.

## 2021-03-20 NOTE — ED Notes (Signed)
Attempted to give patient her Synthroid but she asked to wait to give it. Will give dose to day shift nurse. Patient resting comfortably on recliner in room. RR even and unlabored. Patient verbalizes no needs or complaints at this time.

## 2021-03-20 NOTE — ED Provider Notes (Addendum)
Discussed with Dr. Holley Raring who recommended patient needs admission for fluid overload for daily dialysis.  Will discuss with the hospital team at this time.  Discussed with the hospitalist Dr. Francine Graven but unfortunately while discussed that I was alerted that patient was getting more altered and not waking up.  Went to go evaluate patient.  According to nurse she had been on 6 L of oxygen through the night and had been satting 86-89%.  Patient supposed be on BiPAP at nighttime but was not placed on it.  This morning she is awake but is more confused.  Suspect that she is hypercapnic.  Will start on BiPAP and get VBG, repeat labs, chest x-ray.  Initial VBG shows significant hypercapnia.  Repeat chest x-ray I reviewed personally and looks concerning for pulmonary opacities I suspect most likely from edema.  I have lower suspicion for infection but given her urine is positive for UTI her white count is climbing I did give her a dose of ceftriaxone.  We will give her a dose of Vanco to cover her for possible pneumonia, hospital-acquired I have sent blood cultures and also ordered a lactate.  Her repeat VBG is slightly improving but patient still is pretty somnolent.  I discussed the case with the nephrology team who is going to try to get her dialyzed in the ICU and discussing with Dr. Joylene John.    I discussed the case with Dr. Mortimer Fries who agrees with admission to the ICU.    Marland KitchenCritical Care Performed by: Vanessa [place], MD Authorized by: Vanessa [place], MD   Critical care provider statement:    Critical care time (minutes):  75   Critical care was necessary to treat or prevent imminent or life-threatening deterioration of the following conditions:  Respiratory failure   Critical care was time spent personally by me on the following activities:  Development of treatment plan with patient or surrogate, discussions with consultants, evaluation of patient's response to treatment, examination of patient,  ordering and review of laboratory studies, ordering and review of radiographic studies, ordering and performing treatments and interventions, pulse oximetry, re-evaluation of patient's condition and review of old charts     Vanessa Bromley, MD 03/20/21 1210

## 2021-03-20 NOTE — ED Notes (Signed)
Patient resting in recliner as she has for the last several days that she has been here. Patient is asleep with no complaints of pain or discomfort. Patient's nasal cannula was repositioned and oxygen was increased to 6lpm due to sustained sats of 89%. Patient previously reported that she is supposed to wear a CPAP at night but hasnt warn one since she arrived. Patient's arms are also dangling freely beside the arms of the recliner which also could be causing the lower reading of her sats. The pulse ox was changed from the clip over to the sticker kind and her sats are now reading 92%. Patient verbalized no needs or complaints at this time. Report was given to this nurse by Marijean Bravo, RN and care was taken over.

## 2021-03-20 NOTE — Progress Notes (Signed)
[facility]  PROGRESS NOTE   Subjective:   Events noted, patient is now on BiPAP. Lethargic but arousable. Patient had been on daily dialysis since Thursday. She was dialyzed yesterday with 2 L fluid removal.  Objective:  Vital signs in last 24 hours:  Temp:  [98.7 F (37.1 C)-98.8 F (37.1 C)] 98.7 F (37.1 C) (02/04 1744) Pulse Rate:  [74-111] 96 (02/05 1230) Resp:  [6-23] 17 (02/05 1230) BP: (104-154)/(41-94) 124/62 (02/05 1230) SpO2:  [88 %-98 %] 94 % (02/05 1230)  Weight change:  Filed Weights    Intake/Output: I/O last 3 completed shifts: In: -  Out: 1510 [Other:1510]   Intake/Output this shift:  No intake/output data recorded.  Physical Exam: General:  No acute distress  Head:  Normocephalic, atraumatic. Moist oral mucosal membranes  Eyes:  Anicteric  Neck:  Supple  Lungs:   Clear to auscultation, normal effort  Heart:  S1S2 no rubs  Abdomen:   Soft, nontender, bowel sounds present  Extremities:  2 plus peripheral edema.  Neurologic:  Lethargic but arousable.  Skin:  No lesions  Access:     Basic Metabolic Panel: Recent Labs  Lab 03/16/21 0942 03/17/21 1618 03/20/21 0939  NA 132* 132* 134*  K 4.5 3.5 5.3*  CL 97* 96* 97*  CO2 26 29 30   GLUCOSE 152* 119* 132*  BUN 38* 15 36*  CREATININE 4.50* 1.99* 4.52*  CALCIUM 8.2* 7.9* 8.7*  PHOS 4.6  --   --     CBC: Recent Labs  Lab 03/15/21 1454 03/16/21 0943 03/17/21 1618 03/20/21 0939 03/20/21 1204  WBC 10.9* 10.9* 13.0* 17.4* 14.6*  NEUTROABS  --   --   --  13.5*  --   HGB 7.4* 7.2* 8.3* 8.0* 7.6*  HCT 25.7* 25.8* 28.9* 29.3* 27.6*  MCV 96.6 97.4 96.0 101.4* 101.8*  PLT 248 248 265 266 234     Urinalysis: Recent Labs    03/17/21 1619  COLORURINE YELLOW*  LABSPEC 1.020  PHURINE 7.0  GLUCOSEU NEGATIVE  HGBUR LARGE*  BILIRUBINUR NEGATIVE  KETONESUR TRACE*  PROTEINUR >300*  NITRITE NEGATIVE  LEUKOCYTESUR LARGE*      Imaging: DG Chest Portable 1 View  Result  Date: 03/20/2021 CLINICAL DATA:  Shortness of breath.  History of heart failure. EXAM: PORTABLE CHEST 1 VIEW COMPARISON:  February 23, 2021 FINDINGS: A double lumen right central line is in good position with 1 tip near the caval atrial junction in the other in the central SVC. No pneumothorax identified. Cardiomegaly stable. The hila and mediastinum are unchanged. Bilateral a platelike opacity seen in the right mid lung. Bilateral pulmonary opacities are identified, right greater than left. The asymmetry may be at least partially due to patient rotation. No other acute abnormalities. IMPRESSION: 1. Bilateral pulmonary opacities are nonspecific. Given history, edema is possible. However, multifocal infection is not excluded on this study due to the somewhat patchy nature of the opacities. Recommend clinical correlation and short-term follow-up imaging to ensure resolution. 2. The platelike opacity in the right mid lung could represent fluid in the fissure or atelectasis. 3. The right central line is in good position. Electronically Signed   By: Dorise Bullion III M.D.   On: 03/20/2021 10:20     Medications:    sodium chloride     famotidine (PEPCID) IV     vancomycin     Followed by   vancomycin      amLODipine  5 mg Oral Daily   atorvastatin  20 mg Oral QHS   calcitRIOL  0.25 mcg Oral Daily   calcium carbonate  1 tablet Oral Daily   Chlorhexidine Gluconate Cloth  6 each Topical Q0600   [START ON 03/21/2021] cloNIDine  0.1 mg Oral Daily   Dulaglutide  1.5 mg Subcutaneous Q7 days   folic acid  6,503 mcg Oral Daily   gabapentin  300 mg Oral QHS   Gerhardt's butt cream  3 application Topical TID   heparin  5,000 Units Subcutaneous Q8H   levothyroxine  112 mcg Oral QAC breakfast   metoprolol tartrate  100 mg Oral BID   sevelamer carbonate  800 mg Oral TID WC   sodium chloride flush  3 mL Intravenous Q12H   [START ON 03/23/2021] Vitamin D (Ergocalciferol)  50,000 Units Oral Weekly    Assessment/  Plan:     Principal Problem:   Acute on chronic respiratory failure with hypoxia and hypercapnia (HCC) Active Problems:   History of breast cancer   Obstructive sleep apnea   Morbid obesity with BMI of 60.0-69.9, adult ([place])   Diabetes mellitus type 2 in obese (HCC)   Hypothyroidism   AF (paroxysmal atrial fibrillation) ([place])   Physical deconditioning   Chronic respiratory failure with hypoxia ([place])   ESRD (end stage renal disease) ([place])   Debility   Acute respiratory failure with hypoxia ([place])  56 y.o.  female morbid obesity, diabetes, peripheral vascular disease, hypertension, coronary artery disease, congestive heart failure, atrial fibrillation, acute kidney injury on top of chronic kidney disease was recently initiated on dialysis.  Patient has been on daily dialysis for fluid removal.  #1: ESRD: Patient has been on daily dialysis.  She is still volume overloaded.  We will dialyze her again today with fluid removal..  #2: Morbid obesity/obstructive sleep apnea: Patient has become more lethargic and is presently on BiPAP.  ICU evaluation requested.  #3: Hypertension: Continue antihypertensive medications awake.  #4: Anemia: Patient has anemia of chronic kidney disease.  Overall prognosis is guarded.  Spoke to hospitalist. Dialysis RN notified.    LOS: 0 Lyla Son, MD [facility] 2/5/202312:45 PM

## 2021-03-20 NOTE — ED Notes (Signed)
Patient had a sudden drop in her oxygen sats to 84%. When this nurse arrived to her room, patient had repositioned her head so that her head was forward. I removed her pillow allowing her airway to be more open and her oxygen sats increased to 88-91%. Will monitor her in this position but this nurse has a nonrebreather ready if needed. This nurse explained to the patient what was going on and I asked again if she might have her cpap here because I noted several bags in her room. She stated it was at home.

## 2021-03-20 NOTE — ED Notes (Signed)
Pt moved with the assistance of  several staff members from recliner to bed. Waiting on resp to come down to be able to go to Ct and then the floor.

## 2021-03-20 NOTE — ED Notes (Signed)
Pt oxygen at 89% on 6 liters. Placed patient on 15 liters via NRB to see if this would improve mentation.

## 2021-03-20 NOTE — ED Notes (Signed)
Rn to bedside to introduce self to pt. Pt sleeping in recliner. Room is a mess. Multiple bags and trash everywhere. Nightshift RN advised pt was supposed to be DC to home on 2/3 and now pt is refusing to get up and walk. This RN will contact social work as soon as they come in so we can create a new plan. Nightshift RN also advised pt was refusing to eat hospital food and family was bringing in multiple bags of fast food which is not in her renal diet. Will discuss with MD and social work.

## 2021-03-20 NOTE — ED Notes (Signed)
Unable to give AM meds due to pt unable to stay awake and alert to swallow.  Respiratory called for assessment.

## 2021-03-20 NOTE — H&P (Signed)
NAME:  Nicole Schmidt, MRN:  [id number], DOB:  [birth year], LOS: 0 ADMISSION DATE:  03/17/2021, CONSULTATION DATE: 03/20/2021 REFERRING MD: Dr. Jari Pigg, CHIEF COMPLAINT: Acute encephalopathy   History of Present Illness:  This is a 56 yo female recently discharged home from [place] on 02/1.  She presented to [facility] ER on 02/2 due to an inability to ambulate up and down her steps due to worsening generalized swelling.  She does have a hx of ESRD and required EMS transport to her first outpatient HD session on 02/2.  However, due to hypotension she only had 1.7L of fluid removed during initial outpatient HD session.  Following her HD treatment she was transported to the ER for further evaluation of her generalized weakness and difficulty with ambulation  ED Course Upon arrival to the ER pt noted to have significant generalized edema.  Lab results revealed Na+ 132, chloride 97, glucose 152, BUN 38, creatinine 4.50, calcium 8.2, wbc 10.9, and hgb 7.2.  Pt with no s/sx of respiratory distress on 2L via nasal canula.  Nephrology consulted with plans for pt to remain in the ER to receive daily hemodialysis for several days in order to improve her volume status and hopefully improving lower extremity mobility.  Care management consulted at that time plan was for pt to be discharged home with home health services.  While boarding in the ER pt became somnolent and unable to follow commands on 02/5. VBG revealed pH 7.14/pCO2 89/bicarb 30.3, pt placed on continuous Bipap.  PCCM team contacted for ICU admission.  Pertinent  Medical History  ESRD on Hemodialysis  HFpEF (Echo 02/09/21 EF 60 to 65%) Acquired Hypothyroidism Anemia Arthritis Right Breast Cancer s/p Radiation Therapy (2014) Type II Diabetes Mellitus  Dyslipidemia  Essential HTN  Gout  Menopause Morbid Obesity  Persistent Atrial Fibrillation  Pulmonary Embolism on Eliquis Thyroid Goiter   Significant Hospital  Events: Including procedures, antibiotic start and stop dates in addition to other pertinent events   02/1: Pt presented to [facility] ER with generalized weakness and difficulty with ambulation due to worsening generalized edema.  Boarded in the ER for daily hemodialysis sessions to improve volume status in an attempt to discharge pt home with home health services 02/5: Pt developed acute on chronic hypercapnic respiratory failure and acute encephalopathy requiring continuous Bipap.  PCCM team contacted for ICU admission   Interim History / Subjective:  Pt somnolent on continuous Bipap settings: 18/8 with FiO2 @45 % but able to follow commands  Objective   Blood pressure (!) 114/50, pulse 92, temperature 98.7 F (37.1 C), temperature source Oral, resp. rate 18, height 5\' 4"  (1.626 m), weight (!) 159.7 kg, last menstrual period 07/17/2012, SpO2 93 %.        Intake/Output Summary (Last 24 hours) at 03/20/2021 1231 Last data filed at 03/19/2021 1303 Gross per 24 hour  Intake --  Output 1510 ml  Net -1510 ml   Filed Weights    Examination: General: acute on chronically ill appearing female, somnolent on continuous Bipap  HENT: large neck unable to assess for JVD  Lungs: crackles and diminished throughout, even, non labored  Cardiovascular: irregular irregular, 2+ radial/1+ distal pulses, 3+ generalized edema  Abdomen: +BS x4, obese, soft, non tender, non distended,  Extremities: moves all extremities  Neuro: somnolent, follows commands, PERRL  GU: purewick in place   Resolved Hospital Problem list   N/A  Assessment & Plan:  Acute on chronic hypercapnic hypoxic respiratory failure secondary  to pulmonary edema complicated by OSA/OHS and morbid obesity - Continuous Bipap for now; pt will need Bipap or CPAP qhs  - Prn bronchodilator therapy  - HIGH RISK FOR MECHANICAL INTUBATION   Persistent atrial fibrillation  HFpEF Hx: Dyslipidemia, HTN, and PE - Continuous telemetry monitoring  - If  CT Head negative will start heparin gtt - Continue outpatient antihypertensives and atorvastatin once able to tolerate po's   ESRD with hyperkalemia  Mild hyponatremia  - Trend BMP - Replace electrolytes as indicated - Nephrology consulted appreciate input~HD per recommendations  UTI - Trend WBC and monitor fever curve - Trend PCT  - Continue ceftriaxone - Follow cultures   Anemia of chronic kidney disease  - Trend CBC - Monitor for s/sx of bleeding and transfuse for hgb <7  Hypothyroidism - Continue synthroid  Type II diabetes mellitus  - CBG's q4hrs  - SSI   Acute encephalopathy suspected secondary to hypercapnia  - CT Head pending  - Avoid sedating medication for now - Frequent reorientation  Best Practice (right click and "Reselect all SmartList Selections" daily)   Diet/type: NPO DVT prophylaxis: LMWH GI prophylaxis: H2B Lines: Dialysis Catheter Foley:  N/A Code Status:  full code Last date of multidisciplinary goals of care discussion [N/A]  Labs   CBC: Recent Labs  Lab 03/15/21 1454 03/16/21 0943 03/17/21 1618 03/20/21 0939 03/20/21 1204  WBC 10.9* 10.9* 13.0* 17.4* 14.6*  NEUTROABS  --   --   --  13.5*  --   HGB 7.4* 7.2* 8.3* 8.0* 7.6*  HCT 25.7* 25.8* 28.9* 29.3* 27.6*  MCV 96.6 97.4 96.0 101.4* 101.8*  PLT 248 248 265 266 998    Basic Metabolic Panel: Recent Labs  Lab 03/16/21 0942 03/17/21 1618 03/20/21 0939  NA 132* 132* 134*  K 4.5 3.5 5.3*  CL 97* 96* 97*  CO2 26 29 30   GLUCOSE 152* 119* 132*  BUN 38* 15 36*  CREATININE 4.50* 1.99* 4.52*  CALCIUM 8.2* 7.9* 8.7*  PHOS 4.6  --   --    GFR: Estimated Creatinine Clearance: 21 mL/min (A) (by C-G formula based on SCr of 4.52 mg/dL (H)). Recent Labs  Lab 03/16/21 0943 03/17/21 1618 03/20/21 0939 03/20/21 1204  WBC 10.9* 13.0* 17.4* 14.6*    Liver Function Tests: Recent Labs  Lab 03/16/21 0942 03/17/21 1618 03/20/21 0939  AST  --  27 20  ALT  --  25 22  ALKPHOS  --   94 92  BILITOT  --  0.4 0.4  PROT  --  7.3 7.2  ALBUMIN 2.7* 3.2* 3.1*   No results for input(s): LIPASE, AMYLASE in the last 168 hours. No results for input(s): AMMONIA in the last 168 hours.  ABG    Component Value Date/Time   PHART 7.385 10/21/2014 1240   PCO2ART 47.8 (H) 10/21/2014 1240   PO2ART 60.4 (L) 10/21/2014 1240   HCO3 30.3 (H) 03/20/2021 1125   TCO2 29.5 10/21/2014 1240   ACIDBASEDEF 0.9 03/20/2021 0939   O2SAT 63.7 03/20/2021 1125     Coagulation Profile: No results for input(s): INR, PROTIME in the last 168 hours.  Cardiac Enzymes: No results for input(s): CKTOTAL, CKMB, CKMBINDEX, TROPONINI in the last 168 hours.  HbA1C: Hgb A1c MFr Bld  Date/Time Value Ref Range Status  02/09/2021 03:43 PM 7.2 (H) 4.8 - 5.6 % Final    Comment:    (NOTE) Pre diabetes:          5.7%-6.4%  Diabetes:              >  6.4%  Glycemic control for   <7.0% adults with diabetes   02/09/2020 01:40 PM 6.4 (H) 4.8 - 5.6 % Final    Comment:    (NOTE) Pre diabetes:          5.7%-6.4%  Diabetes:              >6.4%  Glycemic control for   <7.0% adults with diabetes     CBG: Recent Labs  Lab 03/15/21 2102 03/16/21 0554 03/16/21 1303 03/17/21 2208 03/20/21 0922  GLUCAP 160* 97 100* 182* 118*    Review of Systems:   Unable to assess pt somnolent   Past Medical History:  She,  has a past medical history of (HFpEF) heart failure with preserved ejection fraction ([place]), Acquired hypothyroidism (02/09/2020), Anemia, Arthritis, Breast cancer ([place]) (2014), Breast cancer of upper-inner quadrant of right female breast ([place]), CHF (congestive heart failure) ([place]), CKD (chronic kidney disease), stage III ([place]), Diabetes mellitus type 2 in obese ([place]) (02/09/2020), Diabetes mellitus without complication ([place]), Dyslipidemia (02/09/2020), Essential hypertension, Gout, Hypothyroidism, Menopause, Morbid obesity ([place]), Persistent atrial fibrillation ([place]), Personal history of radiation  therapy, Pulmonary embolism ([place]) (10/2014), and Thyroid goiter.   Surgical History:   Past Surgical History:  Procedure Laterality Date   BREAST BIOPSY Right 2014   breast ca   BREAST EXCISIONAL BIOPSY Right 07/19/2012   breast ca   BREAST SURGERY Right 2014   with sentinel node bx subareaolar duct excision   CARDIOVERSION N/A 11/28/2018   Procedure: CARDIOVERSION;  Surgeon: Minna Merritts, MD;  Location: [facility] ORS;  Service: Cardiovascular;  Laterality: N/A;   COLONOSCOPY WITH PROPOFOL N/A 01/30/2017   Procedure: COLONOSCOPY WITH PROPOFOL;  Surgeon: Christene Lye, MD;  Location: [facility] ENDOSCOPY;  Service: Endoscopy;  Laterality: N/A;   CYSTOSCOPY W/ URETERAL STENT PLACEMENT Left 02/14/2021   Procedure: CYSTOSCOPY WITH RETROGRADE PYELOGRAM/URETERAL STENT PLACEMENT;  Surgeon: Billey Co, MD;  Location: [facility] ORS;  Service: Urology;  Laterality: Left;   DIALYSIS/PERMA CATHETER INSERTION N/A 02/24/2021   Procedure: DIALYSIS/PERMA CATHETER INSERTION;  Surgeon: Algernon Huxley, MD;  Location: [facility];  Service: Cardiovascular;  Laterality: N/A;     Social History:   reports that she has never smoked. She has never used smokeless tobacco. She reports that she does not drink alcohol and does not use drugs.   Family History:  Her family history includes Atrial fibrillation in her mother; Colon cancer (age of onset: 31) in her cousin; Diabetes in her father; Heart attack in her mother; Heart failure in her mother; Hypertension in her father and mother; Parkinson's disease in her father; Rheum arthritis in her mother. There is no history of Breast cancer.   Allergies No Known Allergies   Home Medications  Prior to Admission medications   Medication Sig Start Date End Date Taking? Authorizing Provider  amLODipine (NORVASC) 5 MG tablet Take 1 tablet (5 mg total) by mouth daily. 03/16/21  Yes Collins, Hervey Ard, PA-C  apixaban (ELIQUIS) 5 MG TABS tablet Take 1 tablet (5 mg  total) by mouth 2 (two) times daily. 03/15/21  Yes Angiulli, Lavon Paganini, PA-C  atorvastatin (LIPITOR) 20 MG tablet Take 1 tablet (20 mg total) by mouth at bedtime. 03/15/21  Yes Angiulli, Lavon Paganini, PA-C  calcitRIOL (ROCALTROL) 0.25 MCG capsule Take 1 capsule (0.25 mcg total) by mouth daily. 03/15/21  Yes Angiulli, Lavon Paganini, PA-C  calcium carbonate (OSCAL) 1500 (600 Ca) MG TABS tablet Take 1 tablet (1,500 mg total) by mouth  daily. 03/15/21  Yes Angiulli, Lavon Paganini, PA-C  cloNIDine (CATAPRES) 0.2 MG tablet Take 1 tablet (0.2 mg total) by mouth 2 (two) times daily. 03/15/21  Yes Angiulli, Lavon Paganini, PA-C  Dulaglutide 1.5 MG/0.5ML SOPN Inject 1.5 mg into the skin every 7 (seven) days. 03/15/21  Yes Angiulli, Lavon Paganini, PA-C  ergocalciferol (VITAMIN D2) 1.25 MG (50000 UT) capsule Take 1 capsule (50,000 Units total) by mouth once a week. 03/15/21  Yes Angiulli, Lavon Paganini, PA-C  folic acid (FOLVITE) 1 MG tablet Take 1 tablet (1 mg total) by mouth daily. 03/15/21  Yes Angiulli, Lavon Paganini, PA-C  gabapentin (NEURONTIN) 300 MG capsule Take 1 capsule (300 mg total) by mouth at bedtime. 03/15/21  Yes Angiulli, Lavon Paganini, PA-C  levothyroxine (SYNTHROID) 112 MCG tablet Take 1 tablet (112 mcg total) by mouth daily before breakfast. 03/15/21  Yes Angiulli, Lavon Paganini, PA-C  melatonin 5 MG TABS Take 2 tablets (10 mg total) by mouth at bedtime as needed (sleep). 03/15/21  Yes Angiulli, Lavon Paganini, PA-C  metoprolol tartrate (LOPRESSOR) 100 MG tablet Take 1 tablet (100 mg total) by mouth 2 (two) times daily. 03/15/21 04/14/21 Yes Angiulli, Lavon Paganini, PA-C  Nystatin (GERHARDT'S BUTT CREAM) CREA Apply 3 application topically 3 (three) times daily. 03/15/21  Yes Angiulli, Lavon Paganini, PA-C  sevelamer carbonate (RENVELA) 800 MG tablet Take 1 tablet (800 mg total) by mouth 3 (three) times daily with meals. 03/15/21  Yes Angiulli, Lavon Paganini, PA-C  traMADol (ULTRAM) 50 MG tablet Take 1 tablet (50 mg total) by mouth every 6 (six) hours as needed for moderate  pain. 03/15/21  Yes Angiulli, Lavon Paganini, PA-C  acetaminophen (TYLENOL) 325 MG tablet Take 2 tablets (650 mg total) by mouth every 6 (six) hours as needed for mild pain (or Fever >/= 101). 03/15/21   Angiulli, Lavon Paganini, PA-C  albuterol (VENTOLIN HFA) 108 (90 Base) MCG/ACT inhaler Inhale 2 puffs into the lungs every 6 (six) hours as needed for wheezing or shortness of breath. 03/15/21   Angiulli, Lavon Paganini, PA-C  fluticasone-salmeterol (ADVAIR HFA) 230-21 MCG/ACT inhaler Inhale 2 puffs into the lungs 2 (two) times daily as needed.  Patient not taking: No sig reported  02/09/20  [provider]     Critical care time: 73 minutes      Rosilyn Mings, [place] Pager [phone] (please enter 7 digits) PCCM Consult Pager [phone] (please enter 7 digits)

## 2021-03-20 NOTE — Progress Notes (Signed)
OT Cancellation Note  Patient Details Name: Nicole Schmidt MRN: [id number] DOB: [birth year]   Cancelled Treatment:    Reason Eval/Treat Not Completed: Medical issues which prohibited therapy. Order received. Per discussion with RN, pt currently on BiPAP and not appropriate for OT eval/tx at this time. OT to re-attempt at later time/date as able.   Fredirick Maudlin, OTR/L [place]

## 2021-03-20 NOTE — ED Notes (Signed)
Called christine at request of patient. No answer.

## 2021-03-20 NOTE — ED Notes (Signed)
Spoke with family. Asked about bed time BIPAP. Family said she does use it at night but nobody was asked to bring it.

## 2021-03-20 NOTE — ED Notes (Addendum)
RN to bedside to admin AM meds. Pt was still sleeping. RN had to wake up. Pt is very drousy and can hardly keep her eyes open. Pt is alert but unable to answer certain questions at this time. Pt kept repeating "111 may".  RN assessed BGL to see if she might have been hypoglycemic causing such confusion.

## 2021-03-20 NOTE — ED Notes (Signed)
RN rounded on pt. Pt is resting at this time. Respirations even and unlabored.

## 2021-03-20 NOTE — Progress Notes (Signed)
PHARMACY -  BRIEF ANTIBIOTIC NOTE   Pharmacy has received consult(s) for vancomycin from an ED provider.  The patient's profile has been reviewed for ht/wt/allergies/indication/available labs.    One time order(s) placed for vancomycin 1 g + 1500 mg (total 2500 mg)  Further antibiotics/pharmacy consults should be ordered by admitting physician if indicated.                       Thank you,  Tawnya Crook, PharmD, BCPS Clinical Pharmacist 03/20/2021 12:20 PM

## 2021-03-20 NOTE — ED Notes (Signed)
Patient resting comfortably in recliner in room asleep. RR even and unlabored. Patient remains on 6lpm oxygen via [place] and her sats remain 88-92%. Her arms continue to dangle beside her in the recliner. Patient in no obvious pain or distress and she verbalizes no needs or complaints at this time.

## 2021-03-21 ENCOUNTER — Inpatient Hospital Stay: Payer: BC Managed Care – PPO

## 2021-03-21 DIAGNOSIS — L8990 Pressure ulcer of unspecified site, unspecified stage: Secondary | ICD-10-CM | POA: Insufficient documentation

## 2021-03-21 LAB — BASIC METABOLIC PANEL
Anion gap: 8 (ref 5–15)
BUN: 32 mg/dL — ABNORMAL HIGH (ref 6–20)
CO2: 28 mmol/L (ref 22–32)
Calcium: 8.6 mg/dL — ABNORMAL LOW (ref 8.9–10.3)
Chloride: 97 mmol/L — ABNORMAL LOW (ref 98–111)
Creatinine, Ser: 3.46 mg/dL — ABNORMAL HIGH (ref 0.44–1.00)
GFR, Estimated: 15 mL/min — ABNORMAL LOW (ref >60)
Glucose, Bld: 100 mg/dL — ABNORMAL HIGH (ref 70–99)
Potassium: 4.2 mmol/L (ref 3.5–5.1)
Sodium: 133 mmol/L — ABNORMAL LOW (ref 135–145)

## 2021-03-21 LAB — PROCALCITONIN: Procalcitonin: 0.4 ng/mL

## 2021-03-21 LAB — BLOOD GAS, VENOUS
Acid-Base Excess: 1.7 mmol/L (ref 0.0–2.0)
Bicarbonate: 28.9 mmol/L — ABNORMAL HIGH (ref 20.0–28.0)
Delivery systems: POSITIVE
FIO2: 0.35
O2 Saturation: 84.9 %
Patient temperature: 37
pCO2, Ven: 60 mmHg (ref 44.0–60.0)
pH, Ven: 7.29 (ref 7.250–7.430)
pO2, Ven: 56 mmHg — ABNORMAL HIGH (ref 32.0–45.0)

## 2021-03-21 LAB — CBC
HCT: 26 % — ABNORMAL LOW (ref 36.0–46.0)
Hemoglobin: 7.4 g/dL — ABNORMAL LOW (ref 12.0–15.0)
MCH: 27.3 pg (ref 26.0–34.0)
MCHC: 28.5 g/dL — ABNORMAL LOW (ref 30.0–36.0)
MCV: 95.9 fL (ref 80.0–100.0)
Platelets: 235 10*3/uL (ref 150–400)
RBC: 2.71 MIL/uL — ABNORMAL LOW (ref 3.87–5.11)
RDW: 16.5 % — ABNORMAL HIGH (ref 11.5–15.5)
WBC: 13.7 10*3/uL — ABNORMAL HIGH (ref 4.0–10.5)
nRBC: 0.4 % — ABNORMAL HIGH (ref 0.0–0.2)

## 2021-03-21 LAB — GLUCOSE, CAPILLARY
Glucose-Capillary: 80 mg/dL (ref 70–99)
Glucose-Capillary: 75 mg/dL (ref 70–99)
Glucose-Capillary: 104 mg/dL — ABNORMAL HIGH (ref 70–99)
Glucose-Capillary: 125 mg/dL — ABNORMAL HIGH (ref 70–99)
Glucose-Capillary: 150 mg/dL — ABNORMAL HIGH (ref 70–99)

## 2021-03-21 LAB — MAGNESIUM: Magnesium: 1.8 mg/dL (ref 1.7–2.4)

## 2021-03-21 LAB — PHOSPHORUS: Phosphorus: 4.5 mg/dL (ref 2.5–4.6)

## 2021-03-21 MED ORDER — INSULIN ASPART 100 UNIT/ML IJ SOLN: 0.0000 [IU] | Freq: Every day | INTRAMUSCULAR | Status: DC

## 2021-03-21 MED ORDER — NEPRO/CARBSTEADY PO LIQD
237.0000 mL | Freq: Two times a day (BID) | ORAL | Status: DC
  Administered 2021-03-22 – 2021-03-28 (×7): 237 mL via ORAL

## 2021-03-21 MED ORDER — INSULIN ASPART 100 UNIT/ML IJ SOLN: 0.0000 [IU] | Freq: Three times a day (TID) | INTRAMUSCULAR | Status: DC

## 2021-03-21 MED ORDER — FAMOTIDINE 20 MG PO TABS
10.0000 mg | ORAL_TABLET | Freq: Every day | ORAL | Status: DC
  Administered 2021-03-21 – 2021-03-28 (×8): 10 mg via ORAL
  Filled 2021-03-21 (×8): qty 1

## 2021-03-21 MED ORDER — APIXABAN 5 MG PO TABS
5.0000 mg | ORAL_TABLET | Freq: Two times a day (BID) | ORAL | Status: DC
  Administered 2021-03-21 – 2021-03-28 (×14): 5 mg via ORAL
  Filled 2021-03-21 (×14): qty 1

## 2021-03-21 MED ORDER — RENA-VITE PO TABS
1.0000 | ORAL_TABLET | Freq: Every day | ORAL | Status: DC
  Administered 2021-03-21 – 2021-03-27 (×6): 1 via ORAL
  Filled 2021-03-21 (×8): qty 1

## 2021-03-21 NOTE — TOC Progression Note (Signed)
Transition of Care [facility]) - Progression Note    Patient Details  Name: Nicole Schmidt MRN: [id number] Date of Birth: 1965/08/26  [facility] CM/SW Contact  Shelbie Hutching, RN Phone Number: 03/21/2021, 12:21 PM  Clinical Narrative:    Patient admitted to the hospital, scheduled for dialysis again today.  PT and OT have worked with patient and are recommending Acute inpatient rehab.  RNCM met with patient at the bedside and she is interested in going to rehab again at [facility].  [facility] rehab coordinator notified of current recommendation and she reports she will cont to follow patient progress working with therapy.     Expected Discharge Plan: IP Rehab Facility Barriers to Discharge: Continued Medical Work up  Expected Discharge Plan and Services Expected Discharge Plan: [place]   Discharge Planning Services: CM Consult Post Acute Care Choice: IP Rehab Living arrangements for the past 2 months: Single Family Home                 DME Arranged: N/A DME Agency: NA       HH Arranged: RN, PT, OT HH Agency:  ([facility])         Social Determinants of Health (SDOH) Interventions    Readmission Risk Interventions Readmission Risk Prevention Plan 02/11/2020 10/19/2018  Transportation Screening Complete Complete  PCP or Specialist Appt within 3-5 Days Not Complete -  Not Complete comments To be scheduled when discharged. -  [place] or Home Care Consult Complete -  Social Work Consult for [place] Planning/Counseling Complete Patient refused  Palliative Care Screening Complete Not Applicable  Medication Review Press photographer) Complete Complete  Some recent data might be hidden

## 2021-03-21 NOTE — Evaluation (Signed)
Physical Therapy Evaluation Patient Details Name: Nicole Schmidt MRN: [id number] DOB: 30-Aug-1965 Today's Date: 03/21/2021  History of Present Illness  Nicole Schmidt is a 57yoF who comes to [facility] on 03/17/21 after acute increase in edema limiting ability to walk. Pt DC to home previous day from inpatient rehab in [place]. PMH: ESRD on HD TTS, DMN, HTN, HLD, AF, PE, anemia, dCHF. Pt was at CIR from 1/21-1/31.   Clinical Impression  Patient received in bed, she is very talkative, tangential with speech. She requires +2-3 for all bed mobility at this time. She provides little assistance due to weakness and pain. Reports pain in bottom and legs. L knee especially painful when attempting to move/bend/bear weight. Patient is able to sit unsupported at edge of bed with close supervision. Attempted to stand with bed elevated and +2 assist. Patient unable to clear bottom from bed at all. Unable to scoot in sitting due to pain. She will continue to benefit from skilled PT while here to improve mobility, and strength. (Patient was walking 180 feet at [facility] 6 days ago.)  Patient will benefit from either returning to [facility] or SNF at discharge as she lives alone.            Recommendations for follow up therapy are one component of a multi-disciplinary discharge planning process, led by the attending physician.  Recommendations may be updated based on patient status, additional functional criteria and insurance authorization.  Follow Up Recommendations Acute inpatient rehab (3hours/day)    Assistance Recommended at Discharge Frequent or constant Supervision/Assistance  Patient can return home with the following  Two people to help with walking and/or transfers;Two people to help with bathing/dressing/bathroom    Equipment Recommendations None recommended by PT  Recommendations for Other Services       Functional Status Assessment Patient has had a recent decline in their functional status and demonstrates the  ability to make significant improvements in function in a reasonable and predictable amount of time.     Precautions / Restrictions Precautions Precautions: Fall Precaution Comments: 5 liters Restrictions Weight Bearing Restrictions: No      Mobility  Bed Mobility Overal bed mobility: Needs Assistance Bed Mobility: Supine to Sit, Sit to Supine     Supine to sit: Total assist, +2 for physical assistance Sit to supine: Total assist, +2 for physical assistance   General bed mobility comments: Patient pain limited with mobility requiring +2 for bed mobility    Transfers Overall transfer level: Needs assistance Equipment used: Rolling walker (2 wheels) Transfers: Sit to/from Stand Sit to Stand: Total assist, +2 physical assistance           General transfer comment: patient unable to stand due to reported pain in left knee    Ambulation/Gait               General Gait Details: unable  Stairs            Wheelchair Mobility    Modified Rankin (Stroke Patients Only)       Balance Overall balance assessment: Needs assistance Sitting-balance support: Feet supported Sitting balance-Leahy Scale: Fair         Standing balance comment: unable to stand.                             Pertinent Vitals/Pain Pain Assessment Pain Assessment: Faces Faces Pain Scale: Hurts even more Pain Location: Bottom due to bed sore, L knee, feet  due to swelling Pain Descriptors / Indicators: Grimacing, Guarding, Discomfort Pain Intervention(s): Limited activity within patient's tolerance, Monitored during session, Repositioned    Home Living Family/patient expects to be discharged to:: Skilled nursing facility Living Arrangements: Alone Available Help at Discharge: Friend(s);Available PRN/intermittently Type of Home: House Home Access: Stairs to enter Entrance Stairs-Rails: Right;Left;Can reach both Entrance Stairs-Number of Steps: 2 steps in front with  no rails and 4 steps in the back with 2 rails - plan to get railings installed on front steps (months back using back door consistently due to need for rails)   Home Layout: One level Home Equipment: Cane - single point;Rollator (4 wheels);Rolling Walker (2 wheels);BSC/3in1;Wheelchair - manual;Shower seat Additional Comments: Pt currently lives in her mother's old home that is 1 level with level entry, but planning to move to a new house as soon as possible that's 1 level with 4 steps to enter with B rails. She does not want to return to mother's home as she believe it has mold.    Prior Function Prior Level of Function : Independent/Modified Independent             Mobility Comments: Patient was using cane at inpatient rehab per her report ADLs Comments: Pt reports independence in self care and IADL tasks     Hand Dominance   Dominant Hand: Right    Extremity/Trunk Assessment   Upper Extremity Assessment Upper Extremity Assessment: Defer to OT evaluation    Lower Extremity Assessment Lower Extremity Assessment: LLE deficits/detail;Generalized weakness LLE: Unable to fully assess due to pain LLE Coordination: decreased gross motor       Communication   Communication: No difficulties;Other (comment) (tangential)  Cognition Arousal/Alertness: Awake/alert Behavior During Therapy: WFL for tasks assessed/performed Overall Cognitive Status: Within Functional Limits for tasks assessed                                          General Comments      Exercises     Assessment/Plan    PT Assessment Patient needs continued PT services  PT Problem List Decreased activity tolerance;Cardiopulmonary status limiting activity;Decreased balance;Decreased strength;Decreased mobility;Decreased range of motion;Pain       PT Treatment Interventions DME instruction;Therapeutic exercise;Gait training;Balance training;Stair training;Neuromuscular re-education;Functional  mobility training;Patient/family education;Therapeutic activities    PT Goals (Current goals can be found in the Care Plan section)  Acute Rehab PT Goals Patient Stated Goal: to improve mobility PT Goal Formulation: With patient Time For Goal Achievement: 04/04/21 Potential to Achieve Goals: Fair    Frequency Min 2X/week     Co-evaluation PT/OT/SLP Co-Evaluation/Treatment: Yes Reason for Co-Treatment: For patient/therapist safety;To address functional/ADL transfers PT goals addressed during session: Mobility/safety with mobility         AM-PAC PT "6 Clicks" Mobility  Outcome Measure Help needed turning from your back to your side while in a flat bed without using bedrails?: Total Help needed moving from lying on your back to sitting on the side of a flat bed without using bedrails?: Total Help needed moving to and from a bed to a chair (including a wheelchair)?: Total Help needed standing up from a chair using your arms (e.g., wheelchair or bedside chair)?: Total Help needed to walk in hospital room?: Total Help needed climbing 3-5 steps with a railing? : Total 6 Click Score: 6    End of Session Equipment Utilized During Treatment: Oxygen  Activity Tolerance: Patient limited by pain;Patient limited by fatigue Patient left: in bed;with call bell/phone within reach Nurse Communication: Mobility status PT Visit Diagnosis: Pain;Other abnormalities of gait and mobility (R26.89);Muscle weakness (generalized) (M62.81);Difficulty in walking, not elsewhere classified (R26.2) Pain - Right/Left: Left Pain - part of body: Knee    Time: 0912-0939 PT Time Calculation (min) (ACUTE ONLY): 27 min   Charges:   PT Evaluation $PT Eval Moderate Complexity: 1 Mod          Deondrea Markos, PT, GCS 03/21/21,10:03 AM

## 2021-03-21 NOTE — Progress Notes (Signed)
Inpatient Rehab Admissions Coordinator:   Per therapy recommendations, patient was screened for CIR candidacy by Clemens Catholic, MS, CCC-SLP. At this time, Pt. is requiring total+2 assist for bed mobility and transfers and does not yet appear able to tolerate the intensity of CIR. Note that she recently discharged from CIR at min A level.   Pt. may have potential to progress to becoming a potential CIR candidate, so CIR admissions team will follow and monitor for progress and participation with therapies and place consult order if Pt. appears to be an appropriate candidate. Please contact me with any questions.   Clemens Catholic, [place], [place] Admissions Coordinator  [phone] ([place]) [phone] (office)

## 2021-03-21 NOTE — Progress Notes (Signed)
Pt alert and oriented x 4, on 5 L , went down for dialysis via transport in bed. Pt does desat with talking and moving but comes right back up.

## 2021-03-21 NOTE — Evaluation (Signed)
Occupational Therapy Evaluation Patient Details Name: Nicole Schmidt MRN: [id number] DOB: May 21, 1965 Today's Date: 03/21/2021   History of Present Illness Nicole Schmidt is a 25yoF who comes to [facility] on 03/17/21 after acute increase in edema limiting ability to walk. Pt was DC to home previous day from inpatient rehab in [place]. PMH: ESRD on HD TTS, DMN, HTN, HLD, AF, PE, anemia, dCHF. Pt was at CIR from 1/21-1/31.   Clinical Impression   Pt seen for OT/PT co-evaluation this date. Upon arrival to room, pt seated upright in bed on 5L/min of supplemental O2 (uses 2L at baseline). Pt A&Ox4, however endorsed decreased memory compared to baseline; unable to recall events from yesterday. At baseline, pt reports that she is mod-independent with ADLs/IADLs and functional mobility. Pt was recently discharged from [facility], but reports that following d/c she had difficulty navigating stairs into her home, transferring into cars, and performing LB dressing. Pt currently required MAX-TOTAL A X2 for bed mobility. Once seated EOB, pt demonstrated fair static sitting balance, however required MAX A to don socks while seated EOB d/t pain in L knee and buttocks and decreased dynamic sitting balance. Pt was unable to stand this date following x2 person assist d/t pain in L knee and decreased strength/activity tolerance. Pt returned to bed, with bed placed in chair position and pt with all needs within reach. Pt would benefit from additional skilled OT services to maximize return to PLOF and minimize risk of future falls, injury, caregiver burden, and readmission. Upon discharge, recommend acute inpatient rehab.    Recommendations for follow up therapy are one component of a multi-disciplinary discharge planning process, led by the attending physician.  Recommendations may be updated based on patient status, additional functional criteria and insurance authorization.   Follow Up Recommendations  Acute inpatient rehab  (3hours/day)    Assistance Recommended at Discharge Frequent or constant Supervision/Assistance  Patient can return home with the following A lot of help with walking and/or transfers;A lot of help with bathing/dressing/bathroom;Assistance with cooking/housework;Assist for transportation;Help with stairs or ramp for entrance    Functional Status Assessment  Patient has had a recent decline in their functional status and demonstrates the ability to make significant improvements in function in a reasonable and predictable amount of time.  Equipment Recommendations  Other (comment) (defer to next venue of care)    \   Precautions / Restrictions Precautions Precautions: Fall Precaution Comments: 5 liters Restrictions Weight Bearing Restrictions: No      Mobility Bed Mobility Overal bed mobility: Needs Assistance Bed Mobility: Supine to Sit, Sit to Supine     Supine to sit: Total assist, +2 for physical assistance Sit to supine: Total assist, +2 for physical assistance   General bed mobility comments: Patient pain limited with mobility requiring +2 for bed mobility    Transfers Overall transfer level: Needs assistance Equipment used: Rolling walker (2 wheels) Transfers: Sit to/from Stand Sit to Stand: Total assist, +2 physical assistance           General transfer comment: patient unable to stand due to reported pain in left knee      Balance Overall balance assessment: Needs assistance Sitting-balance support: No upper extremity supported, Feet supported Sitting balance-Leahy Scale: Fair Sitting balance - Comments: demonstrates fair static sitting balance at EOB       Standing balance comment: unable to stand.  ADL either performed or assessed with clinical judgement   ADL Overall ADL's : Needs assistance/impaired                     Lower Body Dressing: Maximal assistance;Sitting/lateral leans Lower Body Dressing  Details (indicate cue type and reason): to don socks while seated EOB                     Vision Baseline Vision/History: 1 Wears glasses Ability to See in Adequate Light: 0 Adequate Patient Visual Report: No change from baseline              Pertinent Vitals/Pain Pain Assessment Pain Assessment: Faces Faces Pain Scale: Hurts even more Pain Location: Bottom due to bed sore, L knee, feet due to swelling Pain Descriptors / Indicators: Grimacing, Guarding, Discomfort Pain Intervention(s): Limited activity within patient's tolerance, Monitored during session, Repositioned     Hand Dominance Right   Extremity/Trunk Assessment Upper Extremity Assessment Upper Extremity Assessment: Generalized weakness   Lower Extremity Assessment Lower Extremity Assessment: Generalized weakness;LLE deficits/detail LLE: Unable to fully assess due to pain LLE Coordination: decreased gross motor       Communication Communication Communication: No difficulties   Cognition Arousal/Alertness: Awake/alert Behavior During Therapy: WFL for tasks assessed/performed Overall Cognitive Status: Impaired/Different from baseline                                 General Comments: Pt A&Ox4, however endorses decreased memory compared to baseline; unable to recall events from yesterday                Home Living Family/patient expects to be discharged to:: Inpatient rehab Living Arrangements: Alone Available Help at Discharge: Friend(s);Available PRN/intermittently Type of Home: House Home Access: Stairs to enter [employer] of Steps: 2 steps in front with no rails and 4 steps in the back with 2 rails - plan to get railings installed on front steps (months back using back door consistently due to need for rails) Entrance Stairs-Rails: Right;Left;Can reach both Home Layout: One level     Bathroom Shower/Tub: Astronomer Accessibility: Yes   Home  Equipment: Cane - single point;Rollator (4 wheels);Rolling Environmental consultant (2 wheels);BSC/3in1;Wheelchair - Brewing technologist    Lives With: Alone    Prior Functioning/Environment Prior Level of Function : Independent/Modified Independent             Mobility Comments: Patient was using cane at inpatient rehab per her report ADLs Comments: Pt reports independence in self care and IADL tasks at baseline        OT Problem List: Decreased activity tolerance;Cardiopulmonary status limiting activity;Decreased strength;Decreased safety awareness;Impaired balance (sitting and/or standing);Decreased knowledge of use of DME or AE      OT Treatment/Interventions: Self-care/ADL training;Therapeutic exercise;Therapeutic activities;Energy conservation;DME and/or AE instruction;Patient/family education;Manual therapy;Balance training    OT Goals(Current goals can be found in the care plan section) Acute Rehab OT Goals Patient Stated Goal: to go to rehab and be independence OT Goal Formulation: With patient Time For Goal Achievement: 04/04/21 Potential to Achieve Goals: Good ADL Goals Pt Will Perform Grooming: with min assist;standing Pt Will Perform Lower Body Dressing: with supervision;with adaptive equipment;sitting/lateral leans Pt Will Transfer to Toilet: with min assist;stand pivot transfer;bedside commode  OT Frequency: Min 2X/week    Co-evaluation PT/OT/SLP Co-Evaluation/Treatment: Yes Reason for Co-Treatment: Necessary to address cognition/behavior during functional activity;For  patient/therapist safety;To address functional/ADL transfers PT goals addressed during session: Mobility/safety with mobility OT goals addressed during session: ADL's and self-care      AM-PAC OT "6 Clicks" Daily Activity     Outcome Measure Help from another person eating meals?: None Help from another person taking care of personal grooming?: A Little Help from another person toileting, which includes using  toliet, bedpan, or urinal?: A Lot Help from another person bathing (including washing, rinsing, drying)?: A Lot Help from another person to put on and taking off regular upper body clothing?: A Little Help from another person to put on and taking off regular lower body clothing?: A Lot 6 Click Score: 16   End of Session Equipment Utilized During Treatment: Oxygen (5Ls) Nurse Communication: Mobility status  Activity Tolerance: Patient tolerated treatment well Patient left: in bed;with call bell/phone within reach;with bed alarm set  OT Visit Diagnosis: Unsteadiness on feet (R26.81);Muscle weakness (generalized) (M62.81)                Time: 0938-1829 OT Time Calculation (min): 31 min Charges:  OT General Charges $OT Visit: 1 Visit OT Evaluation $OT Eval Moderate Complexity: [place], OTR/L [place]

## 2021-03-21 NOTE — Progress Notes (Signed)
Pt back to room from dialysis and presenting with AMS, only answering questions with yes and barely keeping eyes open, NP notified and RT put patient on bipap. Will continue to monitor.

## 2021-03-21 NOTE — Progress Notes (Signed)
Patient is alert and oriented x4. Sitting up in the bed eating her dinner. Cousin is at the bedside. She is also taking phone calls. Her bipap was removed so that she could eat. Nasal cannula applied at 5 L/M and is satting at 96%. Bipap will be reapplied as she prepares for bed. Call bell in reach. Will continue to monitor.

## 2021-03-21 NOTE — Plan of Care (Signed)
°  Problem: Clinical Measurements: Goal: Respiratory complications will improve Outcome: Not Progressing Note: Patient desats while on oxygen with minimal exertion

## 2021-03-21 NOTE — Progress Notes (Signed)
[facility]  PROGRESS NOTE   Subjective:   Transferred to ICU for nonresponsiveness and consistent with hypercapnea. Placed on BIPAP and transferred to ICU.   Hemodialysis yesterday with UF of 3 liters.   This morning, patient awake and oriented. Working with Physical Therapy and Occupational Therapy.    Objective:  Vital signs in last 24 hours:  Temp:  [97.7 F (36.5 C)-98.5 F (36.9 C)] 98 F (36.7 C) (02/06 0800) Pulse Rate:  [81-103] 92 (02/06 0700) Resp:  [6-39] 26 (02/06 0400) BP: (93-169)/(42-104) 125/55 (02/06 0800) SpO2:  [90 %-100 %] 91 % (02/06 0700) Weight:  [165.8 kg-167.4 kg] 165.8 kg (02/06 0500)  Weight change:  Filed Weights   03/20/21 1451 03/20/21 1845 03/21/21 0500  Weight: (!) 167.4 kg (!) 165.8 kg (!) 165.8 kg    Intake/Output: I/O last 3 completed shifts: In: 876.7 [P.O.:300; I.V.:13.5; IV Piggyback:563.2] Out: 3087 [Urine:75; Other:3012]   Intake/Output this shift:  No intake/output data recorded.  Physical Exam: General:  No acute distress, laying in bed  Head:  Normocephalic, atraumatic. Moist oral mucosal membranes  Eyes:  Anicteric  Neck:  Supple, trachea midline  Lungs:   Clear to auscultation, diminished at bases, 5 L [place] O2  Heart:  irregular  Abdomen:   Soft, nontender, bowel sounds present, obese  Extremities:  + peripheral edema.  Neurologic:  Alert and oriented x 4  Skin:  No lesions  Access:  RIJ permcath    Basic Metabolic Panel: Recent Labs  Lab 03/16/21 0942 03/17/21 1618 03/20/21 0939 03/20/21 1204 03/20/21 1429 03/21/21 0547  NA 132* 132* 134*  --  133* 133*  K 4.5 3.5 5.3*  --  4.8 4.2  CL 97* 96* 97*  --  97* 97*  CO2 26 29 30   --  27 28  GLUCOSE 152* 119* 132*  --  105* 100*  BUN 38* 15 36*  --  40* 32*  CREATININE 4.50* 1.99* 4.52* 4.54* 4.57* 3.46*  CALCIUM 8.2* 7.9* 8.7*  --  8.8* 8.6*  MG  --   --   --   --  2.0 1.8  PHOS 4.6  --   --   --  6.4* 4.5     CBC: Recent Labs  Lab  03/17/21 1618 03/20/21 0939 03/20/21 1204 03/20/21 1429 03/21/21 0547  WBC 13.0* 17.4* 14.6* 14.4* 13.7*  NEUTROABS  --  13.5*  --   --   --   HGB 8.3* 8.0* 7.6* 7.8* 7.4*  HCT 28.9* 29.3* 27.6* 27.9* 26.0*  MCV 96.0 101.4* 101.8* 100.0 95.9  PLT 265 266 234 244 235      Urinalysis: No results for input(s): COLORURINE, LABSPEC, PHURINE, GLUCOSEU, HGBUR, BILIRUBINUR, KETONESUR, PROTEINUR, UROBILINOGEN, NITRITE, LEUKOCYTESUR in the last 72 hours.  Invalid input(s): APPERANCEUR     Imaging: CT HEAD WO CONTRAST (5MM)  Result Date: 03/20/2021 CLINICAL DATA:  Mental status change. EXAM: CT HEAD WITHOUT CONTRAST TECHNIQUE: Contiguous axial images were obtained from the base of the skull through the vertex without intravenous contrast. RADIATION DOSE REDUCTION: This exam was performed according to the departmental dose-optimization program which includes automated exposure control, adjustment of the mA and/or kV according to patient size and/or use of iterative reconstruction technique. COMPARISON:  None. FINDINGS: Brain: No evidence of acute infarction, hemorrhage, hydrocephalus, extra-axial collection or mass lesion/mass effect. Vascular: No hyperdense vessel. Intracranial atherosclerotic disease. Skull: No osseous abnormality. Sinuses/Orbits: Visualized paranasal sinuses are clear. Visualized mastoid sinuses are clear. Visualized orbits demonstrate  no focal abnormality. Other: None IMPRESSION: 1. No acute intracranial abnormality. Electronically Signed   By: Kathreen Devoid M.D.   On: 03/20/2021 14:08   DG Chest Port 1 View  Result Date: 03/21/2021 CLINICAL DATA:  56 year old female with history of shortness of breath and heart failure. Evaluate for interval changes. EXAM: PORTABLE CHEST 1 VIEW COMPARISON:  Chest x-ray 03/20/2021. FINDINGS: Right internal jugular PermCath with tips terminating in the distal superior vena cava and right atrium. Lung volumes are low. Widespread areas of  interstitial prominence and patchy ill-defined airspace disease noted throughout the lungs bilaterally, most pronounced in the right mid to upper lung and at the left lung base. Probable small left pleural effusion. No pneumothorax. Cephalization of the pulmonary vasculature. Heart size is mildly enlarged. Upper mediastinal contours are within normal limits. Atherosclerotic calcifications are noted in the thoracic aorta. IMPRESSION: 1. Minimally improved aeration in the lungs bilaterally, favored to reflect resolving multilobar bilateral bronchopneumonia, although some degree of pulmonary edema is also suspected, which could indicate concurrent congestive heart failure, particularly in light of the enlarged cardiac silhouette. 2. Small left pleural effusion. 1. Electronically Signed   By: Vinnie Langton M.D.   On: 03/21/2021 05:25   DG Chest Portable 1 View  Result Date: 03/20/2021 CLINICAL DATA:  Shortness of breath.  History of heart failure. EXAM: PORTABLE CHEST 1 VIEW COMPARISON:  February 23, 2021 FINDINGS: A double lumen right central line is in good position with 1 tip near the caval atrial junction in the other in the central SVC. No pneumothorax identified. Cardiomegaly stable. The hila and mediastinum are unchanged. Bilateral a platelike opacity seen in the right mid lung. Bilateral pulmonary opacities are identified, right greater than left. The asymmetry may be at least partially due to patient rotation. No other acute abnormalities. IMPRESSION: 1. Bilateral pulmonary opacities are nonspecific. Given history, edema is possible. However, multifocal infection is not excluded on this study due to the somewhat patchy nature of the opacities. Recommend clinical correlation and short-term follow-up imaging to ensure resolution. 2. The platelike opacity in the right mid lung could represent fluid in the fissure or atelectasis. 3. The right central line is in good position. Electronically Signed   By: Dorise Bullion III M.D.   On: 03/20/2021 10:20     Medications:    sodium chloride     sodium chloride     sodium chloride Stopped (03/20/21 1758)   cefTRIAXone (ROCEPHIN)  IV      amLODipine  5 mg Oral Daily   apixaban  5 mg Oral BID   atorvastatin  20 mg Oral QHS   calcitRIOL  0.25 mcg Oral Daily   calcium carbonate  1 tablet Oral Daily   chlorhexidine  15 mL Mouth Rinse BID   Chlorhexidine Gluconate Cloth  6 each Topical Q0600   Chlorhexidine Gluconate Cloth  6 each Topical Q0600   cloNIDine  0.1 mg Oral Daily   famotidine  10 mg Oral Daily   folic acid  8,416 mcg Oral Daily   gabapentin  300 mg Oral QHS   Gerhardt's butt cream  3 application Topical TID   insulin aspart  0-5 Units Subcutaneous QHS   insulin aspart  0-6 Units Subcutaneous TID WC   levothyroxine  112 mcg Oral QAC breakfast   mouth rinse  15 mL Mouth Rinse q12n4p   metoprolol tartrate  100 mg Oral BID   sevelamer carbonate  800 mg Oral TID WC   sodium  chloride flush  3 mL Intravenous Q12H   [START ON 03/23/2021] Vitamin D (Ergocalciferol)  50,000 Units Oral Weekly    Assessment/ Plan:     Principal Problem:   Acute on chronic respiratory failure with hypoxia and hypercapnia (HCC) Active Problems:   History of breast cancer   Obstructive sleep apnea   Morbid obesity with BMI of 60.0-69.9, adult (HCC)   Diabetes mellitus type 2 in obese (HCC)   Hypothyroidism   AF (paroxysmal atrial fibrillation) (HCC)   Physical deconditioning   Chronic respiratory failure with hypoxia ([place])   ESRD (end stage renal disease) ([place])   Debility   Acute respiratory failure with hypoxia (HCC)   Pressure injury of skin  Nicole Schmidt is a 56 y.o. female with morbid obesity, diabetes mellitus type II, peripheral vascular disease, hypertension, coronary artery disease, congestive heart failure, atrial fibrillation, acute kidney injury on chronic kidney disease requiring hemodialysis.  [facility] TTS [address] RIJ permcath  166.5kg  #1: Acute kidney injury versus progression to End Stage Renal Disease: currently with volume overload and requiring daily dialysis. Plan on dialysis later today.   #2: Morbid obesity/obstructive sleep apnea: Pickwickian syndrome. Appreciate pulmonology input. Continue BIPAP at night.    #3: Hypertension: 128/49. Current regimen of amlodipine, metoprolol and clonidine.   #4: Anemia of chronic kidney disease. Hemoglobin low at 7.4. Most likely contributing to patient's overall condition. Start EPO with TTS schedule  #5. Secondary Hyperparathyroidism: sevelamer with meals.     LOS: [place], [facility] 2/6/202310:18 AM

## 2021-03-21 NOTE — Progress Notes (Signed)
NAME:  Nicole Schmidt, MRN:  [id number], DOB:  [birth year], LOS: 1 ADMISSION DATE:  03/17/2021, CONSULTATION DATE: 03/20/2021 REFERRING MD: Dr. Jari Pigg, CHIEF COMPLAINT: Acute encephalopathy   History of Present Illness:  This is a 56 yo female recently discharged home from [place] on 02/1.  She presented to [facility] ER on 02/2 due to an inability to ambulate up and down her steps due to worsening generalized swelling.  She does have a hx of ESRD and required EMS transport to her first outpatient HD session on 02/2.  However, due to hypotension she only had 1.7L of fluid removed during initial outpatient HD session.  Following her HD treatment she was transported to the ER for further evaluation of her generalized weakness and difficulty with ambulation  03/21/21- patient remains in anasarca unable to do PT/OT due to leg pain from tense swelling. She had HD daily for last 3 days still is very edematous. She is speaking in full sentences but is on 5L/min.  Will transition to Step down.  She will require CIR.  Pertinent  Medical History  ESRD on Hemodialysis  HFpEF (Echo 02/09/21 EF 60 to 65%) Acquired Hypothyroidism Anemia Arthritis Right Breast Cancer s/p Radiation Therapy (2014) Type II Diabetes Mellitus  Dyslipidemia  Essential HTN  Gout  Menopause Morbid Obesity  Persistent Atrial Fibrillation  Pulmonary Embolism on Eliquis Thyroid Goiter   Significant Hospital Events: Including procedures, antibiotic start and stop dates in addition to other pertinent events   02/1: Pt presented to [facility] ER with generalized weakness and difficulty with ambulation due to worsening generalized edema.  Boarded in the ER for daily hemodialysis sessions to improve volume status in an attempt to discharge pt home with home health services 02/5: Pt developed acute on chronic hypercapnic respiratory failure and acute encephalopathy requiring continuous Bipap.  PCCM team contacted for ICU  admission   Interim History / Subjective:  Pt somnolent on continuous Bipap settings: 18/8 with FiO2 @45 % but able to follow commands  Objective   Blood pressure (!) 125/55, pulse 92, temperature 98 F (36.7 C), temperature source Oral, resp. rate (!) 26, height 5\' 4"  (1.626 m), weight (!) 165.8 kg, last menstrual period 07/17/2012, SpO2 91 %.        Intake/Output Summary (Last 24 hours) at 03/21/2021 0920 Last data filed at 03/21/2021 0549 Gross per 24 hour  Intake 876.69 ml  Output 3087 ml  Net -2210.31 ml    Filed Weights   03/20/21 1451 03/20/21 1845 03/21/21 0500  Weight: (!) 167.4 kg (!) 165.8 kg (!) 165.8 kg    Examination: General: acute on chronically ill appearing female, somnolent on continuous Bipap  HENT: large neck unable to assess for JVD  Lungs: crackles and diminished throughout, even, non labored  Cardiovascular: irregular irregular, 2+ radial/1+ distal pulses, 3+ generalized edema  Abdomen: +BS x4, obese, soft, non tender, non distended,  Extremities: moves all extremities  Neuro: somnolent, follows commands, PERRL  GU: purewick in place   Resolved Hospital Problem list   N/A  Assessment & Plan:  Acute on chronic hypercapnic hypoxic respiratory failure secondary to pulmonary edema complicated by OSA/OHS and morbid obesity - Continuous Bipap for now; pt will need Bipap or CPAP qhs  - Prn bronchodilator therapy  - HIGH RISK FOR MECHANICAL INTUBATION   Persistent atrial fibrillation  HFpEF Hx: Dyslipidemia, HTN, and PE - Continuous telemetry monitoring  - If CT Head negative will start heparin gtt - Continue outpatient  antihypertensives and atorvastatin once able to tolerate po's   ESRD with hyperkalemia  Mild hyponatremia  - Trend BMP - Replace electrolytes as indicated - Nephrology consulted appreciate input~HD per recommendations  UTI - Trend WBC and monitor fever curve - Trend PCT  - Continue ceftriaxone - Follow cultures   Anemia of  chronic kidney disease  - Trend CBC - Monitor for s/sx of bleeding and transfuse for hgb <7  Hypothyroidism - Continue synthroid  Type II diabetes mellitus  - CBG's q4hrs  - SSI   Acute encephalopathy suspected secondary to hypercapnia  - CT Head pending  - Avoid sedating medication for now - Frequent reorientation  Best Practice (right click and "Reselect all SmartList Selections" daily)   Diet/type: NPO DVT prophylaxis: LMWH GI prophylaxis: H2B Lines: Dialysis Catheter Foley:  N/A Code Status:  full code Last date of multidisciplinary goals of care discussion [N/A]  Labs   CBC: Recent Labs  Lab 03/17/21 1618 03/20/21 0939 03/20/21 1204 03/20/21 1429 03/21/21 0547  WBC 13.0* 17.4* 14.6* 14.4* 13.7*  NEUTROABS  --  13.5*  --   --   --   HGB 8.3* 8.0* 7.6* 7.8* 7.4*  HCT 28.9* 29.3* 27.6* 27.9* 26.0*  MCV 96.0 101.4* 101.8* 100.0 95.9  PLT 265 266 234 244 235     Basic Metabolic Panel: Recent Labs  Lab 03/16/21 0942 03/17/21 1618 03/20/21 0939 03/20/21 1204 03/20/21 1429 03/21/21 0547  NA 132* 132* 134*  --  133* 133*  K 4.5 3.5 5.3*  --  4.8 4.2  CL 97* 96* 97*  --  97* 97*  CO2 26 29 30   --  27 28  GLUCOSE 152* 119* 132*  --  105* 100*  BUN 38* 15 36*  --  40* 32*  CREATININE 4.50* 1.99* 4.52* 4.54* 4.57* 3.46*  CALCIUM 8.2* 7.9* 8.7*  --  8.8* 8.6*  MG  --   --   --   --  2.0 1.8  PHOS 4.6  --   --   --  6.4* 4.5    GFR: Estimated Creatinine Clearance: 28.1 mL/min (A) (by C-G formula based on SCr of 3.46 mg/dL (H)). Recent Labs  Lab 03/20/21 0939 03/20/21 1204 03/20/21 1429 03/21/21 0547  PROCALCITON  --   --  0.32 0.40  WBC 17.4* 14.6* 14.4* 13.7*  LATICACIDVEN  --  0.7 0.9  --      Liver Function Tests: Recent Labs  Lab 03/16/21 0942 03/17/21 1618 03/20/21 0939 03/20/21 1429  AST  --  27 20  --   ALT  --  25 22  --   ALKPHOS  --  94 92  --   BILITOT  --  0.4 0.4  --   PROT  --  7.3 7.2  --   ALBUMIN 2.7* 3.2* 3.1* 2.8*     No results for input(s): LIPASE, AMYLASE in the last 168 hours. No results for input(s): AMMONIA in the last 168 hours.  ABG    Component Value Date/Time   PHART 7.385 10/21/2014 1240   PCO2ART 47.8 (H) 10/21/2014 1240   PO2ART 60.4 (L) 10/21/2014 1240   HCO3 28.9 (H) 03/21/2021 0547   TCO2 29.5 10/21/2014 1240   ACIDBASEDEF 0.9 03/20/2021 0939   O2SAT 84.9 03/21/2021 0547      Coagulation Profile: No results for input(s): INR, PROTIME in the last 168 hours.  Cardiac Enzymes: No results for input(s): CKTOTAL, CKMB, CKMBINDEX, TROPONINI in the last 168 hours.  HbA1C: Hgb A1c MFr Bld  Date/Time Value Ref Range Status  02/09/2021 03:43 PM 7.2 (H) 4.8 - 5.6 % Final    Comment:    (NOTE) Pre diabetes:          5.7%-6.4%  Diabetes:              >6.4%  Glycemic control for   <7.0% adults with diabetes   02/09/2020 01:40 PM 6.4 (H) 4.8 - 5.6 % Final    Comment:    (NOTE) Pre diabetes:          5.7%-6.4%  Diabetes:              >6.4%  Glycemic control for   <7.0% adults with diabetes     CBG: Recent Labs  Lab 03/20/21 1600 03/20/21 1936 03/20/21 2340 03/21/21 0340 03/21/21 0734  GLUCAP 87 84 101* 80 75     Review of Systems:   Unable to assess pt somnolent   Past Medical History:  She,  has a past medical history of (HFpEF) heart failure with preserved ejection fraction ([place]), Acquired hypothyroidism (02/09/2020), Anemia, Arthritis, Breast cancer ([place]) (2014), Breast cancer of upper-inner quadrant of right female breast ([place]), CHF (congestive heart failure) ([place]), CKD (chronic kidney disease), stage III ([place]), Diabetes mellitus type 2 in obese ([place]) (02/09/2020), Diabetes mellitus without complication ([place]), Dyslipidemia (02/09/2020), Essential hypertension, Gout, Hypothyroidism, Menopause, Morbid obesity ([place]), Persistent atrial fibrillation ([place]), Personal history of radiation therapy, Pulmonary embolism ([place]) (10/2014), and Thyroid goiter.   Surgical  History:   Past Surgical History:  Procedure Laterality Date   BREAST BIOPSY Right 2014   breast ca   BREAST EXCISIONAL BIOPSY Right 07/19/2012   breast ca   BREAST SURGERY Right 2014   with sentinel node bx subareaolar duct excision   CARDIOVERSION N/A 11/28/2018   Procedure: CARDIOVERSION;  Surgeon: Minna Merritts, MD;  Location: [facility] ORS;  Service: Cardiovascular;  Laterality: N/A;   COLONOSCOPY WITH PROPOFOL N/A 01/30/2017   Procedure: COLONOSCOPY WITH PROPOFOL;  Surgeon: Christene Lye, MD;  Location: [facility] ENDOSCOPY;  Service: Endoscopy;  Laterality: N/A;   CYSTOSCOPY W/ URETERAL STENT PLACEMENT Left 02/14/2021   Procedure: CYSTOSCOPY WITH RETROGRADE PYELOGRAM/URETERAL STENT PLACEMENT;  Surgeon: Billey Co, MD;  Location: [facility] ORS;  Service: Urology;  Laterality: Left;   DIALYSIS/PERMA CATHETER INSERTION N/A 02/24/2021   Procedure: DIALYSIS/PERMA CATHETER INSERTION;  Surgeon: Algernon Huxley, MD;  Location: [facility];  Service: Cardiovascular;  Laterality: N/A;     Social History:   reports that she has never smoked. She has never used smokeless tobacco. She reports that she does not drink alcohol and does not use drugs.   Family History:  Her family history includes Atrial fibrillation in her mother; Colon cancer (age of onset: 61) in her cousin; Diabetes in her father; Heart attack in her mother; Heart failure in her mother; Hypertension in her father and mother; Parkinson's disease in her father; Rheum arthritis in her mother. There is no history of Breast cancer.   Allergies No Known Allergies   Home Medications  Prior to Admission medications   Medication Sig Start Date End Date Taking? Authorizing Provider  amLODipine (NORVASC) 5 MG tablet Take 1 tablet (5 mg total) by mouth daily. 03/16/21  Yes Collins, Hervey Ard, PA-C  apixaban (ELIQUIS) 5 MG TABS tablet Take 1 tablet (5 mg total) by mouth 2 (two) times daily. 03/15/21  Yes Angiulli, Lavon Paganini, PA-C   atorvastatin (LIPITOR) 20 MG  tablet Take 1 tablet (20 mg total) by mouth at bedtime. 03/15/21  Yes Angiulli, Lavon Paganini, PA-C  calcitRIOL (ROCALTROL) 0.25 MCG capsule Take 1 capsule (0.25 mcg total) by mouth daily. 03/15/21  Yes Angiulli, Lavon Paganini, PA-C  calcium carbonate (OSCAL) 1500 (600 Ca) MG TABS tablet Take 1 tablet (1,500 mg total) by mouth daily. 03/15/21  Yes Angiulli, Lavon Paganini, PA-C  cloNIDine (CATAPRES) 0.2 MG tablet Take 1 tablet (0.2 mg total) by mouth 2 (two) times daily. 03/15/21  Yes Angiulli, Lavon Paganini, PA-C  Dulaglutide 1.5 MG/0.5ML SOPN Inject 1.5 mg into the skin every 7 (seven) days. 03/15/21  Yes Angiulli, Lavon Paganini, PA-C  ergocalciferol (VITAMIN D2) 1.25 MG (50000 UT) capsule Take 1 capsule (50,000 Units total) by mouth once a week. 03/15/21  Yes Angiulli, Lavon Paganini, PA-C  folic acid (FOLVITE) 1 MG tablet Take 1 tablet (1 mg total) by mouth daily. 03/15/21  Yes Angiulli, Lavon Paganini, PA-C  gabapentin (NEURONTIN) 300 MG capsule Take 1 capsule (300 mg total) by mouth at bedtime. 03/15/21  Yes Angiulli, Lavon Paganini, PA-C  levothyroxine (SYNTHROID) 112 MCG tablet Take 1 tablet (112 mcg total) by mouth daily before breakfast. 03/15/21  Yes Angiulli, Lavon Paganini, PA-C  melatonin 5 MG TABS Take 2 tablets (10 mg total) by mouth at bedtime as needed (sleep). 03/15/21  Yes Angiulli, Lavon Paganini, PA-C  metoprolol tartrate (LOPRESSOR) 100 MG tablet Take 1 tablet (100 mg total) by mouth 2 (two) times daily. 03/15/21 04/14/21 Yes Angiulli, Lavon Paganini, PA-C  Nystatin (GERHARDT'S BUTT CREAM) CREA Apply 3 application topically 3 (three) times daily. 03/15/21  Yes Angiulli, Lavon Paganini, PA-C  sevelamer carbonate (RENVELA) 800 MG tablet Take 1 tablet (800 mg total) by mouth 3 (three) times daily with meals. 03/15/21  Yes Angiulli, Lavon Paganini, PA-C  traMADol (ULTRAM) 50 MG tablet Take 1 tablet (50 mg total) by mouth every 6 (six) hours as needed for moderate pain. 03/15/21  Yes Angiulli, Lavon Paganini, PA-C  acetaminophen (TYLENOL) 325 MG  tablet Take 2 tablets (650 mg total) by mouth every 6 (six) hours as needed for mild pain (or Fever >/= 101). 03/15/21   Angiulli, Lavon Paganini, PA-C  albuterol (VENTOLIN HFA) 108 (90 Base) MCG/ACT inhaler Inhale 2 puffs into the lungs every 6 (six) hours as needed for wheezing or shortness of breath. 03/15/21   Angiulli, Lavon Paganini, PA-C  fluticasone-salmeterol (ADVAIR HFA) 230-21 MCG/ACT inhaler Inhale 2 puffs into the lungs 2 (two) times daily as needed.  Patient not taking: No sig reported  02/09/20  [provider]     Critical care provider statement:   Total critical care time: 33 minutes   Performed by: Lanney Gins MD   Critical care time was exclusive of separately billable procedures and treating other patients.   Critical care was necessary to treat or prevent imminent or life-threatening deterioration.   Critical care was time spent personally by me on the following activities: development of treatment plan with patient and/or surrogate as well as nursing, discussions with consultants, evaluation of patient's response to treatment, examination of patient, obtaining history from patient or surrogate, ordering and performing treatments and interventions, ordering and review of laboratory studies, ordering and review of radiographic studies, pulse oximetry and re-evaluation of patient's condition.    Ottie Glazier, M.D.  Pulmonary & Critical Care Medicine

## 2021-03-21 NOTE — Progress Notes (Signed)
Patient tolerated treatment without incident, fluid removal 3+ liters. No concerns with CVC. Patient scheduled to return next day to resume normal outpatient schedule.

## 2021-03-22 ENCOUNTER — Inpatient Hospital Stay: Payer: BC Managed Care – PPO

## 2021-03-22 DIAGNOSIS — J9602 Acute respiratory failure with hypercapnia: Secondary | ICD-10-CM

## 2021-03-22 DIAGNOSIS — J9611 Chronic respiratory failure with hypoxia: Secondary | ICD-10-CM

## 2021-03-22 DIAGNOSIS — E669 Obesity, unspecified: Secondary | ICD-10-CM

## 2021-03-22 DIAGNOSIS — R262 Difficulty in walking, not elsewhere classified: Secondary | ICD-10-CM

## 2021-03-22 DIAGNOSIS — E1169 Type 2 diabetes mellitus with other specified complication: Secondary | ICD-10-CM

## 2021-03-22 DIAGNOSIS — I480 Paroxysmal atrial fibrillation: Secondary | ICD-10-CM

## 2021-03-22 DIAGNOSIS — R5381 Other malaise: Secondary | ICD-10-CM

## 2021-03-22 DIAGNOSIS — J9601 Acute respiratory failure with hypoxia: Secondary | ICD-10-CM

## 2021-03-22 LAB — GLUCOSE, CAPILLARY
Glucose-Capillary: 91 mg/dL (ref 70–99)
Glucose-Capillary: 83 mg/dL (ref 70–99)
Glucose-Capillary: 80 mg/dL (ref 70–99)

## 2021-03-22 LAB — BASIC METABOLIC PANEL
Anion gap: 6 (ref 5–15)
BUN: 36 mg/dL — ABNORMAL HIGH (ref 6–20)
CO2: 29 mmol/L (ref 22–32)
Calcium: 8.6 mg/dL — ABNORMAL LOW (ref 8.9–10.3)
Chloride: 98 mmol/L (ref 98–111)
Creatinine, Ser: 3.36 mg/dL — ABNORMAL HIGH (ref 0.44–1.00)
GFR, Estimated: 15 mL/min — ABNORMAL LOW (ref >60)
Glucose, Bld: 96 mg/dL (ref 70–99)
Potassium: 4.2 mmol/L (ref 3.5–5.1)
Sodium: 133 mmol/L — ABNORMAL LOW (ref 135–145)

## 2021-03-22 LAB — CBC WITH DIFFERENTIAL/PLATELET
Abs Immature Granulocytes: 0.12 10*3/uL — ABNORMAL HIGH (ref 0.00–0.07)
Basophils Absolute: 0.1 10*3/uL (ref 0.0–0.1)
Basophils Relative: 1 %
Eosinophils Absolute: 0.3 10*3/uL (ref 0.0–0.5)
Eosinophils Relative: 2 %
HCT: 26.2 % — ABNORMAL LOW (ref 36.0–46.0)
Hemoglobin: 7.5 g/dL — ABNORMAL LOW (ref 12.0–15.0)
Immature Granulocytes: 1 %
Lymphocytes Relative: 9 %
Lymphs Abs: 1.2 10*3/uL (ref 0.7–4.0)
MCH: 27.9 pg (ref 26.0–34.0)
MCHC: 28.6 g/dL — ABNORMAL LOW (ref 30.0–36.0)
MCV: 97.4 fL (ref 80.0–100.0)
Monocytes Absolute: 1.4 10*3/uL — ABNORMAL HIGH (ref 0.1–1.0)
Monocytes Relative: 11 %
Neutro Abs: 10.2 10*3/uL — ABNORMAL HIGH (ref 1.7–7.7)
Neutrophils Relative %: 76 %
Platelets: 229 10*3/uL (ref 150–400)
RBC: 2.69 MIL/uL — ABNORMAL LOW (ref 3.87–5.11)
RDW: 16.3 % — ABNORMAL HIGH (ref 11.5–15.5)
WBC: 13.2 10*3/uL — ABNORMAL HIGH (ref 4.0–10.5)
nRBC: 0.8 % — ABNORMAL HIGH (ref 0.0–0.2)

## 2021-03-22 LAB — URINE CULTURE: Culture: >=100000 — AB

## 2021-03-22 LAB — BLOOD GAS, ARTERIAL
Acid-Base Excess: 3.7 mmol/L — ABNORMAL HIGH (ref 0.0–2.0)
Bicarbonate: 32.3 mmol/L — ABNORMAL HIGH (ref 20.0–28.0)
FIO2: 40
O2 Saturation: 84.2 %
Patient temperature: 37
pCO2 arterial: 79 mmHg (ref 32.0–48.0)
pH, Arterial: 7.22 — ABNORMAL LOW (ref 7.350–7.450)
pO2, Arterial: 59 mmHg — ABNORMAL LOW (ref 83.0–108.0)

## 2021-03-22 LAB — MAGNESIUM: Magnesium: 2 mg/dL (ref 1.7–2.4)

## 2021-03-22 LAB — PROCALCITONIN: Procalcitonin: 0.5 ng/mL

## 2021-03-22 LAB — PHOSPHORUS: Phosphorus: 4.7 mg/dL — ABNORMAL HIGH (ref 2.5–4.6)

## 2021-03-22 MED ORDER — SODIUM CHLORIDE 0.9 % IV SOLN: 100.0000 mL | INTRAVENOUS | Status: DC | PRN

## 2021-03-22 MED ORDER — LIDOCAINE HCL (PF) 1 % IJ SOLN: 5.0000 mL | INTRAMUSCULAR | Status: DC | PRN

## 2021-03-22 MED ORDER — PENTAFLUOROPROP-TETRAFLUOROETH EX AERO: 1.0000 "application " | INHALATION_SPRAY | CUTANEOUS | Status: DC | PRN

## 2021-03-22 MED ORDER — LIDOCAINE-PRILOCAINE 2.5-2.5 % EX CREA
1.0000 "application " | TOPICAL_CREAM | CUTANEOUS | Status: DC | PRN
  Filled 2021-03-22: qty 5

## 2021-03-22 MED ORDER — EPOETIN ALFA 10000 UNIT/ML IJ SOLN
10000.0000 [IU] | INTRAMUSCULAR | Status: DC
  Administered 2021-03-22 – 2021-03-26 (×3): 10000 [IU] via INTRAVENOUS
  Filled 2021-03-22: qty 1

## 2021-03-22 MED ORDER — HEPARIN SODIUM (PORCINE) 1000 UNIT/ML IJ SOLN
INTRAMUSCULAR | Status: AC
  Filled 2021-03-22: qty 10

## 2021-03-22 MED ORDER — CHLORHEXIDINE GLUCONATE CLOTH 2 % EX PADS
6.0000 | MEDICATED_PAD | Freq: Every morning | CUTANEOUS | Status: DC
  Administered 2021-03-23 – 2021-03-27 (×4): 6 via TOPICAL

## 2021-03-22 MED ORDER — ALTEPLASE 2 MG IJ SOLR: 2.0000 mg | Freq: Once | INTRAMUSCULAR | Status: DC | PRN

## 2021-03-22 MED ORDER — EPOETIN ALFA 10000 UNIT/ML IJ SOLN
INTRAMUSCULAR | Status: AC
  Filled 2021-03-22: qty 1

## 2021-03-22 MED ORDER — HEPARIN SODIUM (PORCINE) 1000 UNIT/ML DIALYSIS
1000.0000 [IU] | INTRAMUSCULAR | Status: DC | PRN
  Administered 2021-03-22: 10000 [IU] via INTRAVENOUS_CENTRAL

## 2021-03-22 NOTE — Progress Notes (Signed)
Pt stated she wanted off at this time. Pt alert in no respiratory distress, placed on 5lpm [place]. Will place back on tonight QHS.

## 2021-03-22 NOTE — Progress Notes (Signed)
[facility]  PROGRESS NOTE   Subjective:   Episode of confusion overnight.   Hemodialysis treatment yesterday. Tolerated treatment well. UF of 3 Liters.   Currently on BIPAP and somnolent.    Objective:  Vital signs in last 24 hours:  Temp:  [97.9 F (36.6 C)-99 F (37.2 C)] 99 F (37.2 C) (02/07 0900) Pulse Rate:  [72-98] 81 (02/07 0900) Resp:  [0-25] 19 (02/07 0900) BP: (96-148)/(40-84) 110/57 (02/07 0900) SpO2:  [77 %-97 %] 95 % (02/07 0900)  Weight change:  Filed Weights   03/21/21 0500  Weight: (!) 165.8 kg    Intake/Output: I/O last 3 completed shifts: In: 300 [P.O.:300] Out: 3161 [Urine:75; Other:3086]   Intake/Output this shift:  No intake/output data recorded.  Physical Exam: General:  No acute distress, laying in bed, sleeping  Head:  Normocephalic, atraumatic. Moist oral mucosal membranes  Eyes:  Anicteric  Neck:  Supple, trachea midline  Lungs:   Clear to auscultation, diminished at bases, BIPAP  Heart:  irregular  Abdomen:   Soft, nontender, bowel sounds present, obese  Extremities:  + peripheral edema.  Neurologic:  nonfocal  Skin:  No lesions  Access:  RIJ permcath    Basic Metabolic Panel: Recent Labs  Lab 03/16/21 0942 03/17/21 1618 03/20/21 0939 03/20/21 1204 03/20/21 1429 03/21/21 0547 03/22/21 0417  NA 132* 132* 134*  --  133* 133* 133*  K 4.5 3.5 5.3*  --  4.8 4.2 4.2  CL 97* 96* 97*  --  97* 97* 98  CO2 26 29 30   --  27 28 29   GLUCOSE 152* 119* 132*  --  105* 100* 96  BUN 38* 15 36*  --  40* 32* 36*  CREATININE 4.50* 1.99* 4.52* 4.54* 4.57* 3.46* 3.36*  CALCIUM 8.2* 7.9* 8.7*  --  8.8* 8.6* 8.6*  MG  --   --   --   --  2.0 1.8 2.0  PHOS 4.6  --   --   --  6.4* 4.5 4.7*     CBC: Recent Labs  Lab 03/20/21 0939 03/20/21 1204 03/20/21 1429 03/21/21 0547 03/22/21 0417  WBC 17.4* 14.6* 14.4* 13.7* 13.2*  NEUTROABS 13.5*  --   --   --  10.2*  HGB 8.0* 7.6* 7.8* 7.4* 7.5*  HCT 29.3* 27.6* 27.9* 26.0*  26.2*  MCV 101.4* 101.8* 100.0 95.9 97.4  PLT 266 234 244 235 229      Urinalysis: No results for input(s): COLORURINE, LABSPEC, PHURINE, GLUCOSEU, HGBUR, BILIRUBINUR, KETONESUR, PROTEINUR, UROBILINOGEN, NITRITE, LEUKOCYTESUR in the last 72 hours.  Invalid input(s): APPERANCEUR     Imaging: CT HEAD WO CONTRAST (5MM)  Result Date: 03/20/2021 CLINICAL DATA:  Mental status change. EXAM: CT HEAD WITHOUT CONTRAST TECHNIQUE: Contiguous axial images were obtained from the base of the skull through the vertex without intravenous contrast. RADIATION DOSE REDUCTION: This exam was performed according to the departmental dose-optimization program which includes automated exposure control, adjustment of the mA and/or kV according to patient size and/or use of iterative reconstruction technique. COMPARISON:  None. FINDINGS: Brain: No evidence of acute infarction, hemorrhage, hydrocephalus, extra-axial collection or mass lesion/mass effect. Vascular: No hyperdense vessel. Intracranial atherosclerotic disease. Skull: No osseous abnormality. Sinuses/Orbits: Visualized paranasal sinuses are clear. Visualized mastoid sinuses are clear. Visualized orbits demonstrate no focal abnormality. Other: None IMPRESSION: 1. No acute intracranial abnormality. Electronically Signed   By: Kathreen Devoid M.D.   On: 03/20/2021 14:08   DG Chest Port 1 View  Result Date:  03/21/2021 CLINICAL DATA:  56 year old female with history of shortness of breath and heart failure. Evaluate for interval changes. EXAM: PORTABLE CHEST 1 VIEW COMPARISON:  Chest x-ray 03/20/2021. FINDINGS: Right internal jugular PermCath with tips terminating in the distal superior vena cava and right atrium. Lung volumes are low. Widespread areas of interstitial prominence and patchy ill-defined airspace disease noted throughout the lungs bilaterally, most pronounced in the right mid to upper lung and at the left lung base. Probable small left pleural effusion. No  pneumothorax. Cephalization of the pulmonary vasculature. Heart size is mildly enlarged. Upper mediastinal contours are within normal limits. Atherosclerotic calcifications are noted in the thoracic aorta. IMPRESSION: 1. Minimally improved aeration in the lungs bilaterally, favored to reflect resolving multilobar bilateral bronchopneumonia, although some degree of pulmonary edema is also suspected, which could indicate concurrent congestive heart failure, particularly in light of the enlarged cardiac silhouette. 2. Small left pleural effusion. 1. Electronically Signed   By: Vinnie Langton M.D.   On: 03/21/2021 05:25   DG Chest Portable 1 View  Result Date: 03/20/2021 CLINICAL DATA:  Shortness of breath.  History of heart failure. EXAM: PORTABLE CHEST 1 VIEW COMPARISON:  February 23, 2021 FINDINGS: A double lumen right central line is in good position with 1 tip near the caval atrial junction in the other in the central SVC. No pneumothorax identified. Cardiomegaly stable. The hila and mediastinum are unchanged. Bilateral a platelike opacity seen in the right mid lung. Bilateral pulmonary opacities are identified, right greater than left. The asymmetry may be at least partially due to patient rotation. No other acute abnormalities. IMPRESSION: 1. Bilateral pulmonary opacities are nonspecific. Given history, edema is possible. However, multifocal infection is not excluded on this study due to the somewhat patchy nature of the opacities. Recommend clinical correlation and short-term follow-up imaging to ensure resolution. 2. The platelike opacity in the right mid lung could represent fluid in the fissure or atelectasis. 3. The right central line is in good position. Electronically Signed   By: Dorise Bullion III M.D.   On: 03/20/2021 10:20     Medications:    sodium chloride Stopped (03/20/21 1758)    amLODipine  5 mg Oral Daily   apixaban  5 mg Oral BID   atorvastatin  20 mg Oral QHS   calcitRIOL  0.25  mcg Oral Daily   calcium carbonate  1 tablet Oral Daily   chlorhexidine  15 mL Mouth Rinse BID   Chlorhexidine Gluconate Cloth  6 each Topical Q0600   Chlorhexidine Gluconate Cloth  6 each Topical Q0600   cloNIDine  0.1 mg Oral Daily   famotidine  10 mg Oral Daily   feeding supplement (NEPRO CARB STEADY)  237 mL Oral BID BM   folic acid  1,497 mcg Oral Daily   gabapentin  300 mg Oral QHS   Gerhardt's butt cream  3 application Topical TID   heparin sodium (porcine)       insulin aspart  0-5 Units Subcutaneous QHS   insulin aspart  0-6 Units Subcutaneous TID WC   levothyroxine  112 mcg Oral QAC breakfast   mouth rinse  15 mL Mouth Rinse q12n4p   metoprolol tartrate  100 mg Oral BID   multivitamin  1 tablet Oral QHS   sevelamer carbonate  800 mg Oral TID WC   sodium chloride flush  3 mL Intravenous Q12H   [START ON 03/23/2021] Vitamin D (Ergocalciferol)  50,000 Units Oral Weekly    Assessment/ Plan:  Principal Problem:   Acute on chronic respiratory failure with hypoxia and hypercapnia (HCC) Active Problems:   History of breast cancer   Obstructive sleep apnea   Morbid obesity with BMI of 60.0-69.9, adult (HCC)   Diabetes mellitus type 2 in obese (HCC)   Hypothyroidism   AF (paroxysmal atrial fibrillation) (HCC)   Physical deconditioning   Chronic respiratory failure with hypoxia ([place])   ESRD (end stage renal disease) ([place])   Debility   Acute respiratory failure with hypoxia ([place])   Pressure injury of skin  [name] is a 55 y.o. female with morbid obesity, diabetes mellitus type II, peripheral vascular disease, hypertension, coronary artery disease, congestive heart failure, atrial fibrillation, acute kidney injury on chronic kidney disease requiring hemodialysis.  [facility] TTS [address] RIJ permcath 166.5kg  #1: Acute kidney injury versus progression to End Stage Renal Disease: currently with volume overload and requiring daily dialysis. Continue daily  dialysis. Dialysis for later today. Orders prepared.   #2: Morbid obesity/obstructive sleep apnea: Pickwickian syndrome. Appreciate pulmonology input. Continue BIPAP at night.    #3: Hypertension: 110/57. Current regimen of amlodipine, metoprolol and clonidine.   #4: Anemia of chronic kidney disease. Hemoglobin low at 7.4. Most likely contributing to patient's overall condition. Start EPO with TTS schedule  #5. Secondary Hyperparathyroidism: sevelamer with meals.     LOS: 2 Lavonia Dana, [facility] 2/7/20239:36 AM

## 2021-03-22 NOTE — Progress Notes (Signed)
PROGRESS NOTE  Nicole Schmidt    DOB: 1965-05-09, 56 y.o.  QQP:[id number]  PCP: Lenard Simmer, MD   Code Status: Full Code   DOA: 03/17/2021   LOS: 2  Brief Narrative of Current Hospitalization  Nicole Schmidt is a 56 y.o. female with a PMH significant for  type 2 diabetes mellitus, hypertension, dyslipidemia, hypothyroidism, gout, HFpEF, atrial fibrillation and PE on Eliquis. She had a recent admission here from 02/01/21 till 02/22/21 during which she was managed for acute on chronic diastolic CHF and AKI superimposed on CKD stage III.  She presents to the ER with  They presented from home to the ED on 03/17/2021 with worsening lower extremity edema as well as increased dyspnea x 1 days. In the ED, it was found that they had acute on chronic hypercapnic and hypoxic respiratory failure. They were treated with transient need for Bipap, bronchodilator therapy.  She is also now considered ESRD and started on HD as managed by nephrology. Patient was admitted to medicine service for further workup and management of fluid overload as outlined in detail below.  03/22/21 -stable, improved  Assessment & Plan  Principal Problem:   Acute on chronic respiratory failure with hypoxia and hypercapnia (HCC) Active Problems:   History of breast cancer   Obstructive sleep apnea   Morbid obesity with BMI of 60.0-69.9, adult (HCC)   Diabetes mellitus type 2 in obese (HCC)   Hypothyroidism   AF (paroxysmal atrial fibrillation) (HCC)   Physical deconditioning   Chronic respiratory failure with hypoxia (HCC)   ESRD (end stage renal disease) ([place])   Debility   Acute respiratory failure with hypoxia (HCC)   Pressure injury of skin  Acute on chronic hypercapnic hypoxic respiratory failure- multifactorial as listed below - currently stable on 5Lnc  - wean oxygen as tolerated - Prn bronchodilator therapy   OSA/OHS - Cpap QHS  ESRD on HD TThS   pulmonary edema    hyperkalemia   Mild hyponatremia  - Trend  RFP - Replace electrolytes as indicated - Nephrology consulted appreciate input~HD per recommendations   PAF   HFpEF   HLD   HTN   h/o PE  - Continue outpatient antihypertensives and atorvastatin - continue eliquis   UTI- asymptomatic  - completed CTX   Anemia of chronic kidney disease- Transfusion threshold <7. No signs of active bleeding. Hgb stable around 7.5 - Trend CBC - EPO with HD - Monitor for s/sx of bleeding and transfuse for hgb <7   Hypothyroidism - Continue synthroid   Type II diabetes mellitus- blood sugars have been well controlled to low.  - discontinue insulin. - monitor blood glucose on regular labs    Acute encephalopathy suspected secondary to hypercapnia- resolved  DVT prophylaxis: apixaban (ELIQUIS) tablet 5 mg   Diet:  Diet Orders (From admission, onward)     Start     Ordered   03/22/21 0718  Diet renal with fluid restriction Fluid restriction: 1200 mL Fluid; Room service appropriate? Yes with Assist; Fluid consistency: Thin  Diet effective now       Question Answer Comment  Fluid restriction: 1200 mL Fluid   Room service appropriate? Yes with Assist   Fluid consistency: Thin      03/22/21 0717            Subjective 03/22/21    Pt reports frustration that she is in the hospital so much. She feels improved from admission. No other complaints.  Disposition Plan &  Communication  Patient status: Inpatient  Admitted From: Home Disposition: CIR Anticipated discharge date: TBD  Family Communication: none  Consults, Procedures, Significant Events  Consultants:  Nephrology CCM  Procedures/significant events:  HD  Antimicrobials:  Anti-infectives (From admission, onward)    Start     Dose/Rate Route Frequency Ordered Stop   03/21/21 1200  cefTRIAXone (ROCEPHIN) 1 g in sodium chloride 0.9 % 100 mL IVPB  Status:  Discontinued        1 g 200 mL/hr over 30 Minutes Intravenous Every 24 hours 03/20/21 1331 03/21/21 1132   03/20/21 1330   vancomycin (VANCOREADY) IVPB 1500 mg/300 mL       See Hyperspace for full Linked Orders Report.   1,500 mg 150 mL/hr over 120 Minutes Intravenous  Once 03/20/21 1218 03/20/21 1607   03/20/21 1230  vancomycin (VANCOCIN) IVPB 1000 mg/200 mL premix       See Hyperspace for full Linked Orders Report.   1,000 mg 200 mL/hr over 60 Minutes Intravenous  Once 03/20/21 1218 03/20/21 1754   03/20/21 1215  vancomycin (VANCOCIN) IVPB 1000 mg/200 mL premix  Status:  Discontinued        1,000 mg 200 mL/hr over 60 Minutes Intravenous  Once 03/20/21 1209 03/20/21 1218   03/20/21 1115  cefTRIAXone (ROCEPHIN) 2 g in sodium chloride 0.9 % 100 mL IVPB        2 g 200 mL/hr over 30 Minutes Intravenous  Once 03/20/21 1101 03/20/21 1203       Objective   Vitals:   03/22/21 0500 03/22/21 0600 03/22/21 0700 03/22/21 0800  BP: (!) 108/54 (!) 109/54 (!) 118/57 (!) 115/54  Pulse: 75 73 84 82  Resp: (!) 21 18 (!) 24 (!) 23  Temp:    98.2 F (36.8 C)  TempSrc:    Oral  SpO2: 96% 96% 92% 96%  Weight:      Height:        Intake/Output Summary (Last 24 hours) at 03/22/2021 0814 Last data filed at 03/21/2021 1608 Gross per 24 hour  Intake --  Output 3086 ml  Net -3086 ml   Filed Weights   03/21/21 0500  Weight: (!) 165.8 kg    Patient BMI: Body mass index is 62.74 kg/m.   Physical Exam:  General: awake, alert, NAD HEENT: atraumatic, clear conjunctiva, anicteric sclera, MMM, hearing grossly normal Respiratory: normal respiratory effort. Cardiovascular: normal S1/S2, RRR, no JVD, murmurs, quick capillary refill  Gastrointestinal: soft, NT, ND Nervous: A&O x3. no gross focal neurologic deficits, normal speech Extremities: generalized edema Skin: dry, intact, normal temperature, normal color. No rashes, lesions or ulcers on exposed skin Psychiatry: depressed mood, congruent affect  Labs   I have personally reviewed following labs and imaging studies CBC    Component Value Date/Time   WBC 13.2 (H)  03/22/2021 0417   RBC 2.69 (L) 03/22/2021 0417   HGB 7.5 (L) 03/22/2021 0417   HGB 9.9 (L) 02/27/2020 0931   HCT 26.2 (L) 03/22/2021 0417   HCT 32.2 (L) 02/27/2020 0931   PLT 229 03/22/2021 0417   PLT 232 02/27/2020 0931   MCV 97.4 03/22/2021 0417   MCV 89 02/27/2020 0931   MCV 90 03/09/2014 1533   MCH 27.9 03/22/2021 0417   MCHC 28.6 (L) 03/22/2021 0417   RDW 16.3 (H) 03/22/2021 0417   RDW 13.0 02/27/2020 0931   RDW 15.2 (H) 03/09/2014 1533   LYMPHSABS 1.2 03/22/2021 0417   LYMPHSABS 1.5 03/09/2014 1533   MONOABS  1.4 (H) 03/22/2021 0417   MONOABS 0.6 03/09/2014 1533   EOSABS 0.3 03/22/2021 0417   EOSABS 0.2 03/09/2014 1533   BASOSABS 0.1 03/22/2021 0417   BASOSABS 0.1 03/09/2014 1533   BMP Latest Ref Rng & Units 03/22/2021 03/21/2021 03/20/2021  Glucose 70 - 99 mg/dL 96 100(H) 105(H)  BUN 6 - 20 mg/dL 36(H) 32(H) 40(H)  Creatinine 0.44 - 1.00 mg/dL 3.36(H) 3.46(H) 4.57(H)  BUN/Creat Ratio 9 - 23 - - -  Sodium 135 - 145 mmol/L 133(L) 133(L) 133(L)  Potassium 3.5 - 5.1 mmol/L 4.2 4.2 4.8  Chloride 98 - 111 mmol/L 98 97(L) 97(L)  CO2 22 - 32 mmol/L 29 28 27   Calcium 8.9 - 10.3 mg/dL 8.6(L) 8.6(L) 8.8(L)   Imaging Studies  CT HEAD WO CONTRAST (5MM)  Result Date: 03/20/2021 CLINICAL DATA:  Mental status change. EXAM: CT HEAD WITHOUT CONTRAST TECHNIQUE: Contiguous axial images were obtained from the base of the skull through the vertex without intravenous contrast. RADIATION DOSE REDUCTION: This exam was performed according to the departmental dose-optimization program which includes automated exposure control, adjustment of the mA and/or kV according to patient size and/or use of iterative reconstruction technique. COMPARISON:  None. FINDINGS: Brain: No evidence of acute infarction, hemorrhage, hydrocephalus, extra-axial collection or mass lesion/mass effect. Vascular: No hyperdense vessel. Intracranial atherosclerotic disease. Skull: No osseous abnormality. Sinuses/Orbits: Visualized  paranasal sinuses are clear. Visualized mastoid sinuses are clear. Visualized orbits demonstrate no focal abnormality. Other: None IMPRESSION: 1. No acute intracranial abnormality. Electronically Signed   By: Kathreen Devoid M.D.   On: 03/20/2021 14:08   DG Chest Port 1 View  Result Date: 03/21/2021 CLINICAL DATA:  56 year old female with history of shortness of breath and heart failure. Evaluate for interval changes. EXAM: PORTABLE CHEST 1 VIEW COMPARISON:  Chest x-ray 03/20/2021. FINDINGS: Right internal jugular PermCath with tips terminating in the distal superior vena cava and right atrium. Lung volumes are low. Widespread areas of interstitial prominence and patchy ill-defined airspace disease noted throughout the lungs bilaterally, most pronounced in the right mid to upper lung and at the left lung base. Probable small left pleural effusion. No pneumothorax. Cephalization of the pulmonary vasculature. Heart size is mildly enlarged. Upper mediastinal contours are within normal limits. Atherosclerotic calcifications are noted in the thoracic aorta. IMPRESSION: 1. Minimally improved aeration in the lungs bilaterally, favored to reflect resolving multilobar bilateral bronchopneumonia, although some degree of pulmonary edema is also suspected, which could indicate concurrent congestive heart failure, particularly in light of the enlarged cardiac silhouette. 2. Small left pleural effusion. 1. Electronically Signed   By: Vinnie Langton M.D.   On: 03/21/2021 05:25   DG Chest Portable 1 View  Result Date: 03/20/2021 CLINICAL DATA:  Shortness of breath.  History of heart failure. EXAM: PORTABLE CHEST 1 VIEW COMPARISON:  February 23, 2021 FINDINGS: A double lumen right central line is in good position with 1 tip near the caval atrial junction in the other in the central SVC. No pneumothorax identified. Cardiomegaly stable. The hila and mediastinum are unchanged. Bilateral a platelike opacity seen in the right mid  lung. Bilateral pulmonary opacities are identified, right greater than left. The asymmetry may be at least partially due to patient rotation. No other acute abnormalities. IMPRESSION: 1. Bilateral pulmonary opacities are nonspecific. Given history, edema is possible. However, multifocal infection is not excluded on this study due to the somewhat patchy nature of the opacities. Recommend clinical correlation and short-term follow-up imaging to ensure resolution. 2. The  platelike opacity in the right mid lung could represent fluid in the fissure or atelectasis. 3. The right central line is in good position. Electronically Signed   By: Dorise Bullion III M.D.   On: 03/20/2021 10:20    Medications   Scheduled Meds:  amLODipine  5 mg Oral Daily   apixaban  5 mg Oral BID   atorvastatin  20 mg Oral QHS   calcitRIOL  0.25 mcg Oral Daily   calcium carbonate  1 tablet Oral Daily   chlorhexidine  15 mL Mouth Rinse BID   Chlorhexidine Gluconate Cloth  6 each Topical Q0600   Chlorhexidine Gluconate Cloth  6 each Topical Q0600   cloNIDine  0.1 mg Oral Daily   famotidine  10 mg Oral Daily   feeding supplement (NEPRO CARB STEADY)  237 mL Oral BID BM   folic acid  0,354 mcg Oral Daily   gabapentin  300 mg Oral QHS   Gerhardt's butt cream  3 application Topical TID   insulin aspart  0-5 Units Subcutaneous QHS   insulin aspart  0-6 Units Subcutaneous TID WC   levothyroxine  112 mcg Oral QAC breakfast   mouth rinse  15 mL Mouth Rinse q12n4p   metoprolol tartrate  100 mg Oral BID   multivitamin  1 tablet Oral QHS   sevelamer carbonate  800 mg Oral TID WC   sodium chloride flush  3 mL Intravenous Q12H   [START ON 03/23/2021] Vitamin D (Ergocalciferol)  50,000 Units Oral Weekly   No recently discontinued medications to reconcile  LOS: 2 days   Richarda Osmond, DO [facility] 03/22/2021, 8:14 AM   Available by Epic secure chat 7AM-7PM. If 7PM-7AM, please contact night-coverage Refer to [email or web address]  to contact the [facility] Attending or Consulting provider for this pt

## 2021-03-22 NOTE — Progress Notes (Signed)
Patient had a period of confusion and was not aware of her location. Patient has been reoriented, bipap is in place.

## 2021-03-22 NOTE — Progress Notes (Signed)
Physical Therapy Treatment Patient Details Name: Nicole Schmidt MRN: [id number] DOB: 1965-08-24 Today's Date: 03/22/2021   History of Present Illness Nicole Schmidt is a 23yoF who comes to [facility] on 03/17/21 after acute increase in edema limiting ability to walk. Pt was DC to home previous day from inpatient rehab in [place]. PMH: ESRD on HD TTS, DMN, HTN, HLD, AF, PE, anemia, dCHF. Pt was at CIR from 1/21-1/31.    PT Comments    Patient had dialysis earlier today. She feels fatigue and is lethargic but agreeable to PT. She participated with LE exercises in bed. Max A of one person for rolling and repositioning in bed with Sp02 88-90's with activity. Further mobility deferred due to lethargy and fatigue with minimal activity. PT will continue to follow to maximize independence as patient is not at her baseline level of functional mobility.    Recommendations for follow up therapy are one component of a multi-disciplinary discharge planning process, led by the attending physician.  Recommendations may be updated based on patient status, additional functional criteria and insurance authorization.  Follow Up Recommendations  Acute inpatient rehab (3hours/day)     Assistance Recommended at Discharge Frequent or constant Supervision/Assistance  Patient can return home with the following Two people to help with walking and/or transfers;Two people to help with bathing/dressing/bathroom   Equipment Recommendations  None recommended by PT    Recommendations for Other Services       Precautions / Restrictions Precautions Precautions: Fall Restrictions Weight Bearing Restrictions: No     Mobility  Bed Mobility Overal bed mobility: Needs Assistance Bed Mobility: Rolling Rolling: Max assist         General bed mobility comments: patient is able to roll to the left with maximal assistance. when placing head of bed flat, patient was able to reach overhead for UE support on bed rails for  adjusting up in the bed with maximal assistance from therapist. cues for technique provided. increased time and effort  required with all activity. further activity due to fatigue, lethargy    Transfers                        Ambulation/Gait                   Stairs             Wheelchair Mobility    Modified Rankin (Stroke Patients Only)       Balance                                            Cognition Arousal/Alertness: Lethargic Behavior During Therapy: WFL for tasks assessed/performed Overall Cognitive Status: Impaired/Different from baseline                                 General Comments: patient is able to follow single step commands with extra time. she if groggy throughout session with eyes closed at times.        Exercises General Exercises - Lower Extremity Ankle Circles/Pumps: AAROM, Strengthening, Both, 10 reps, Supine Heel Slides: AAROM, Strengthening, Both, 10 reps, Supine Hip ABduction/ADduction: AAROM, Strengthening, Both, 10 reps, Supine Other Exercises Other Exercises: verbal cues for exercise technique    General Comments General comments (skin integrity, edema, etc.): Sp02 88-90's  with activity. increased respiration rate noted with activity.      Pertinent Vitals/Pain Pain Assessment Pain Assessment: No/denies pain    Home Living                          Prior Function            PT Goals (current goals can now be found in the care plan section) Acute Rehab PT Goals Patient Stated Goal: to get better PT Goal Formulation: With patient Time For Goal Achievement: 04/04/21 Potential to Achieve Goals: Fair Progress towards PT goals: Progressing toward goals    Frequency    Min 2X/week      PT Plan Current plan remains appropriate    Co-evaluation              AM-PAC PT "6 Clicks" Mobility   Outcome Measure  Help needed turning from your back to your  side while in a flat bed without using bedrails?: A Lot Help needed moving from lying on your back to sitting on the side of a flat bed without using bedrails?: Total Help needed moving to and from a bed to a chair (including a wheelchair)?: Total Help needed standing up from a chair using your arms (e.g., wheelchair or bedside chair)?: Total Help needed to walk in hospital room?: Total Help needed climbing 3-5 steps with a railing? : Total 6 Click Score: 7    End of Session Equipment Utilized During Treatment: Oxygen Activity Tolerance: Patient limited by fatigue;Patient limited by lethargy Patient left: in bed;with call bell/phone within reach (respiratory at bedside for lab draw) Nurse Communication: Mobility status PT Visit Diagnosis: Pain;Other abnormalities of gait and mobility (R26.89);Muscle weakness (generalized) (M62.81);Difficulty in walking, not elsewhere classified (R26.2)     Time: 4825-0037 PT Time Calculation (min) (ACUTE ONLY): 19 min  Charges:  $Therapeutic Activity: 8-22 mins                     Minna Merritts, PT, MPT   Percell Locus 03/22/2021, 3:03 PM

## 2021-03-22 NOTE — Progress Notes (Signed)
Patient completed three hours of hemodialysis via (R) CVC using a 2K, 2.5 ca++ bath for a net UF of two liters.  Patient tolerated well with no HD complications.

## 2021-03-22 NOTE — Progress Notes (Signed)
NAME:  Nicole Schmidt, MRN:  [id number], DOB:  [birth year], LOS: 2 ADMISSION DATE:  03/17/2021, CONSULTATION DATE: 03/20/2021 REFERRING MD: Dr. Jari Pigg, CHIEF COMPLAINT: Acute encephalopathy   History of Present Illness:  This is a 56 yo female recently discharged home from [place] on 02/1.  She presented to [facility] ER on 02/2 due to an inability to ambulate up and down her steps due to worsening generalized swelling.  She does have a hx of ESRD and required EMS transport to her first outpatient HD session on 02/2.  However, due to hypotension she only had 1.7L of fluid removed during initial outpatient HD session.  Following her HD treatment she was transported to the ER for further evaluation of her generalized weakness and difficulty with ambulation  03/21/21- patient remains in anasarca unable to do PT/OT due to leg pain from tense swelling. She had HD daily for last 3 days still is very edematous. She is speaking in full sentences but is on 5L/min.  Will transition to Step down.  She will require CIR.   03/22/21 - patient resting in bed no distress, remains on 5L/min [place]. S/p HD remains with episodes of encephalopathy. Leucocytosis is trending down and renal function is stable.  May benefit from pRBC transfusion due to severe anemia.   ABG and repeat CXR today.  Pertinent  Medical History  ESRD on Hemodialysis  HFpEF (Echo 02/09/21 EF 60 to 65%) Acquired Hypothyroidism Anemia Arthritis Right Breast Cancer s/p Radiation Therapy (2014) Type II Diabetes Mellitus  Dyslipidemia  Essential HTN  Gout  Menopause Morbid Obesity  Persistent Atrial Fibrillation  Pulmonary Embolism on Eliquis Thyroid Goiter   Significant Hospital Events: Including procedures, antibiotic start and stop dates in addition to other pertinent events   02/1: Pt presented to [facility] ER with generalized weakness and difficulty with ambulation due to worsening generalized edema.  Boarded in the ER for  daily hemodialysis sessions to improve volume status in an attempt to discharge pt home with home health services 02/5: Pt developed acute on chronic hypercapnic respiratory failure and acute encephalopathy requiring continuous Bipap.  PCCM team contacted for ICU admission   Interim History / Subjective:  Pt somnolent on continuous Bipap settings: 18/8 with FiO2 @45 % but able to follow commands  Objective   Blood pressure (!) 142/52, pulse 89, temperature (P) 98.8 F (37.1 C), temperature source (P) Oral, resp. rate 19, height 5\' 4"  (1.626 m), weight (!) 165.8 kg, last menstrual period 07/17/2012, SpO2 93 %.        Intake/Output Summary (Last 24 hours) at 03/22/2021 1340 Last data filed at 03/22/2021 1240 Gross per 24 hour  Intake --  Output 5086 ml  Net -5086 ml    Filed Weights   03/21/21 0500  Weight: (!) 165.8 kg    Examination: General: acute on chronically ill appearing female, somnolent on continuous Bipap  HENT: large neck unable to assess for JVD  Lungs: crackles and diminished throughout, even, non labored  Cardiovascular: irregular irregular, 2+ radial/1+ distal pulses, 3+ generalized edema  Abdomen: +BS x4, obese, soft, non tender, non distended,  Extremities: moves all extremities  Neuro: somnolent, follows commands, PERRL  GU: purewick in place   Resolved Hospital Problem list   N/A  Assessment & Plan:  Acute on chronic hypercapnic hypoxic respiratory failure secondary to pulmonary edema complicated by OSA/OHS and morbid obesity - Continuous Bipap for now; pt will need Bipap or CPAP qhs  - Prn bronchodilator  therapy  - HIGH RISK FOR MECHANICAL INTUBATION currently on [place] 5l/min     -ABG today  Persistent atrial fibrillation  HFpEF Hx: Dyslipidemia, HTN, and PE - Continuous telemetry monitoring  - If CT Head negative will start heparin gtt - Continue outpatient antihypertensives and atorvastatin once able to tolerate po's   ESRD with hyperkalemia  Mild  hyponatremia  - Trend BMP - Replace electrolytes as indicated - Nephrology consulted appreciate input~HD per recommendations  UTI - Trend WBC and monitor fever curve - Trend PCT  - Continue ceftriaxone - Follow cultures   Anemia of chronic kidney disease  - Trend CBC - Monitor for s/sx of bleeding and transfuse for hgb <7  Hypothyroidism - Continue synthroid  Type II diabetes mellitus  - CBG's q4hrs  - SSI   Acute encephalopathy suspected secondary to hypercapnia  - CT Head pending  - Avoid sedating medication for now - Frequent reorientation  Best Practice (right click and "Reselect all SmartList Selections" daily)   Diet/type: NPO DVT prophylaxis: LMWH GI prophylaxis: H2B Lines: Dialysis Catheter Foley:  N/A Code Status:  full code Last date of multidisciplinary goals of care discussion [N/A]  Labs   CBC: Recent Labs  Lab 03/20/21 0939 03/20/21 1204 03/20/21 1429 03/21/21 0547 03/22/21 0417  WBC 17.4* 14.6* 14.4* 13.7* 13.2*  NEUTROABS 13.5*  --   --   --  10.2*  HGB 8.0* 7.6* 7.8* 7.4* 7.5*  HCT 29.3* 27.6* 27.9* 26.0* 26.2*  MCV 101.4* 101.8* 100.0 95.9 97.4  PLT 266 234 244 235 229     Basic Metabolic Panel: Recent Labs  Lab 03/16/21 0942 03/17/21 1618 03/20/21 0939 03/20/21 1204 03/20/21 1429 03/21/21 0547 03/22/21 0417  NA 132* 132* 134*  --  133* 133* 133*  K 4.5 3.5 5.3*  --  4.8 4.2 4.2  CL 97* 96* 97*  --  97* 97* 98  CO2 26 29 30   --  27 28 29   GLUCOSE 152* 119* 132*  --  105* 100* 96  BUN 38* 15 36*  --  40* 32* 36*  CREATININE 4.50* 1.99* 4.52* 4.54* 4.57* 3.46* 3.36*  CALCIUM 8.2* 7.9* 8.7*  --  8.8* 8.6* 8.6*  MG  --   --   --   --  2.0 1.8 2.0  PHOS 4.6  --   --   --  6.4* 4.5 4.7*    GFR: Estimated Creatinine Clearance: 28.9 mL/min (A) (by C-G formula based on SCr of 3.36 mg/dL (H)). Recent Labs  Lab 03/20/21 1204 03/20/21 1429 03/21/21 0547 03/22/21 0417  PROCALCITON  --  0.32 0.40 0.50  WBC 14.6* 14.4* 13.7*  13.2*  LATICACIDVEN 0.7 0.9  --   --      Liver Function Tests: Recent Labs  Lab 03/16/21 0942 03/17/21 1618 03/20/21 0939 03/20/21 1429  AST  --  27 20  --   ALT  --  25 22  --   ALKPHOS  --  94 92  --   BILITOT  --  0.4 0.4  --   PROT  --  7.3 7.2  --   ALBUMIN 2.7* 3.2* 3.1* 2.8*    No results for input(s): LIPASE, AMYLASE in the last 168 hours. No results for input(s): AMMONIA in the last 168 hours.  ABG    Component Value Date/Time   PHART 7.385 10/21/2014 1240   PCO2ART 47.8 (H) 10/21/2014 1240   PO2ART 60.4 (L) 10/21/2014 1240   HCO3 28.9 (H)  03/21/2021 0547   TCO2 29.5 10/21/2014 1240   ACIDBASEDEF 0.9 03/20/2021 0939   O2SAT 84.9 03/21/2021 0547      Coagulation Profile: No results for input(s): INR, PROTIME in the last 168 hours.  Cardiac Enzymes: No results for input(s): CKTOTAL, CKMB, CKMBINDEX, TROPONINI in the last 168 hours.  HbA1C: Hgb A1c MFr Bld  Date/Time Value Ref Range Status  02/09/2021 03:43 PM 7.2 (H) 4.8 - 5.6 % Final    Comment:    (NOTE) Pre diabetes:          5.7%-6.4%  Diabetes:              >6.4%  Glycemic control for   <7.0% adults with diabetes   02/09/2020 01:40 PM 6.4 (H) 4.8 - 5.6 % Final    Comment:    (NOTE) Pre diabetes:          5.7%-6.4%  Diabetes:              >6.4%  Glycemic control for   <7.0% adults with diabetes     CBG: Recent Labs  Lab 03/21/21 1126 03/21/21 1649 03/21/21 2119 03/22/21 0729 03/22/21 1313  GLUCAP 104* 125* 150* 91 83     Review of Systems:   Unable to assess pt somnolent   Past Medical History:  She,  has a past medical history of (HFpEF) heart failure with preserved ejection fraction ([place]), Acquired hypothyroidism (02/09/2020), Anemia, Arthritis, Breast cancer ([place]) (2014), Breast cancer of upper-inner quadrant of right female breast ([place]), CHF (congestive heart failure) ([place]), CKD (chronic kidney disease), stage III ([place]), Diabetes mellitus type 2 in obese ([place])  (02/09/2020), Diabetes mellitus without complication ([place]), Dyslipidemia (02/09/2020), Essential hypertension, Gout, Hypothyroidism, Menopause, Morbid obesity ([place]), Persistent atrial fibrillation ([name]), Personal history of radiation therapy, Pulmonary embolism ([place]) (10/2014), and Thyroid goiter.   Surgical History:   Past Surgical History:  Procedure Laterality Date   BREAST BIOPSY Right 2014   breast ca   BREAST EXCISIONAL BIOPSY Right 07/19/2012   breast ca   BREAST SURGERY Right 2014   with sentinel node bx subareaolar duct excision   CARDIOVERSION N/A 11/28/2018   Procedure: CARDIOVERSION;  Surgeon: Minna Merritts, MD;  Location: [facility] ORS;  Service: Cardiovascular;  Laterality: N/A;   COLONOSCOPY WITH PROPOFOL N/A 01/30/2017   Procedure: COLONOSCOPY WITH PROPOFOL;  Surgeon: Christene Lye, MD;  Location: [facility] ENDOSCOPY;  Service: Endoscopy;  Laterality: N/A;   CYSTOSCOPY W/ URETERAL STENT PLACEMENT Left 02/14/2021   Procedure: CYSTOSCOPY WITH RETROGRADE PYELOGRAM/URETERAL STENT PLACEMENT;  Surgeon: Billey Co, MD;  Location: [facility] ORS;  Service: Urology;  Laterality: Left;   DIALYSIS/PERMA CATHETER INSERTION N/A 02/24/2021   Procedure: DIALYSIS/PERMA CATHETER INSERTION;  Surgeon: Algernon Huxley, MD;  Location: [place] [facility] LAB;  Service: Cardiovascular;  Laterality: N/A;     Social History:   reports that she has never smoked. She has never used smokeless tobacco. She reports that she does not drink alcohol and does not use drugs.   Family History:  Her family history includes Atrial fibrillation in her mother; Colon cancer (age of onset: 27) in her cousin; Diabetes in her father; Heart attack in her mother; Heart failure in her mother; Hypertension in her father and mother; Parkinson's disease in her father; Rheum arthritis in her mother. There is no history of Breast cancer.   Allergies No Known Allergies   Home Medications  Prior to Admission medications    Medication Sig Start Date End Date Taking?  Authorizing Provider  amLODipine (NORVASC) 5 MG tablet Take 1 tablet (5 mg total) by mouth daily. 03/16/21  Yes Collins, Hervey Ard, PA-C  apixaban (ELIQUIS) 5 MG TABS tablet Take 1 tablet (5 mg total) by mouth 2 (two) times daily. 03/15/21  Yes Angiulli, Lavon Paganini, PA-C  atorvastatin (LIPITOR) 20 MG tablet Take 1 tablet (20 mg total) by mouth at bedtime. 03/15/21  Yes Angiulli, Lavon Paganini, PA-C  calcitRIOL (ROCALTROL) 0.25 MCG capsule Take 1 capsule (0.25 mcg total) by mouth daily. 03/15/21  Yes Angiulli, Lavon Paganini, PA-C  calcium carbonate (OSCAL) 1500 (600 Ca) MG TABS tablet Take 1 tablet (1,500 mg total) by mouth daily. 03/15/21  Yes Angiulli, Lavon Paganini, PA-C  cloNIDine (CATAPRES) 0.2 MG tablet Take 1 tablet (0.2 mg total) by mouth 2 (two) times daily. 03/15/21  Yes Angiulli, Lavon Paganini, PA-C  Dulaglutide 1.5 MG/0.5ML SOPN Inject 1.5 mg into the skin every 7 (seven) days. 03/15/21  Yes Angiulli, Lavon Paganini, PA-C  ergocalciferol (VITAMIN D2) 1.25 MG (50000 UT) capsule Take 1 capsule (50,000 Units total) by mouth once a week. 03/15/21  Yes Angiulli, Lavon Paganini, PA-C  folic acid (FOLVITE) 1 MG tablet Take 1 tablet (1 mg total) by mouth daily. 03/15/21  Yes Angiulli, Lavon Paganini, PA-C  gabapentin (NEURONTIN) 300 MG capsule Take 1 capsule (300 mg total) by mouth at bedtime. 03/15/21  Yes Angiulli, Lavon Paganini, PA-C  levothyroxine (SYNTHROID) 112 MCG tablet Take 1 tablet (112 mcg total) by mouth daily before breakfast. 03/15/21  Yes Angiulli, Lavon Paganini, PA-C  melatonin 5 MG TABS Take 2 tablets (10 mg total) by mouth at bedtime as needed (sleep). 03/15/21  Yes Angiulli, Lavon Paganini, PA-C  metoprolol tartrate (LOPRESSOR) 100 MG tablet Take 1 tablet (100 mg total) by mouth 2 (two) times daily. 03/15/21 04/14/21 Yes Angiulli, Lavon Paganini, PA-C  Nystatin (GERHARDT'S BUTT CREAM) CREA Apply 3 application topically 3 (three) times daily. 03/15/21  Yes Angiulli, Lavon Paganini, PA-C  sevelamer carbonate (RENVELA)  800 MG tablet Take 1 tablet (800 mg total) by mouth 3 (three) times daily with meals. 03/15/21  Yes Angiulli, Lavon Paganini, PA-C  traMADol (ULTRAM) 50 MG tablet Take 1 tablet (50 mg total) by mouth every 6 (six) hours as needed for moderate pain. 03/15/21  Yes Angiulli, Lavon Paganini, PA-C  acetaminophen (TYLENOL) 325 MG tablet Take 2 tablets (650 mg total) by mouth every 6 (six) hours as needed for mild pain (or Fever >/= 101). 03/15/21   Angiulli, Lavon Paganini, PA-C  albuterol (VENTOLIN HFA) 108 (90 Base) MCG/ACT inhaler Inhale 2 puffs into the lungs every 6 (six) hours as needed for wheezing or shortness of breath. 03/15/21   Angiulli, Lavon Paganini, PA-C  fluticasone-salmeterol (ADVAIR HFA) 230-21 MCG/ACT inhaler Inhale 2 puffs into the lungs 2 (two) times daily as needed.  Patient not taking: No sig reported  02/09/20  [provider]     Critical care provider statement:   Total critical care time: 33 minutes   Performed by: Lanney Gins MD   Critical care time was exclusive of separately billable procedures and treating other patients.   Critical care was necessary to treat or prevent imminent or life-threatening deterioration.   Critical care was time spent personally by me on the following activities: development of treatment plan with patient and/or surrogate as well as nursing, discussions with consultants, evaluation of patient's response to treatment, examination of patient, obtaining history from patient or surrogate, ordering and performing treatments and interventions, ordering and review  of laboratory studies, ordering and review of radiographic studies, pulse oximetry and re-evaluation of patient's condition.    Ottie Glazier, M.D.  Pulmonary & Critical Care Medicine

## 2021-03-23 LAB — MAGNESIUM: Magnesium: 1.9 mg/dL (ref 1.7–2.4)

## 2021-03-23 LAB — RENAL FUNCTION PANEL
Albumin: 2.5 g/dL — ABNORMAL LOW (ref 3.5–5.0)
Anion gap: 8 (ref 5–15)
BUN: 36 mg/dL — ABNORMAL HIGH (ref 6–20)
CO2: 27 mmol/L (ref 22–32)
Calcium: 8.7 mg/dL — ABNORMAL LOW (ref 8.9–10.3)
Chloride: 99 mmol/L (ref 98–111)
Creatinine, Ser: 3.35 mg/dL — ABNORMAL HIGH (ref 0.44–1.00)
GFR, Estimated: 15 mL/min — ABNORMAL LOW (ref >60)
Glucose, Bld: 107 mg/dL — ABNORMAL HIGH (ref 70–99)
Phosphorus: 3.9 mg/dL (ref 2.5–4.6)
Potassium: 3.7 mmol/L (ref 3.5–5.1)
Sodium: 134 mmol/L — ABNORMAL LOW (ref 135–145)

## 2021-03-23 MED ORDER — BISACODYL 10 MG RE SUPP
10.0000 mg | Freq: Every day | RECTAL | Status: DC
  Administered 2021-03-23 – 2021-03-26 (×2): 10 mg via RECTAL
  Filled 2021-03-23 (×5): qty 1

## 2021-03-23 NOTE — Progress Notes (Addendum)
°  Inpatient Rehabilitation Admissions Coordinator   I have placed rehab consult order and then contacted patient by phone. She was admitted to [facility] CIR 1/20 until 2/1. Dis[facility] at [facility] with [facility]. I discussed goals and expectations of another Cir admit. She would like to discuss with her family options for rehab of CIR vs SNF. She is in agreement for me to begin Auth with her [employer] for possible admit and then I will follow up with her tomorrow.  Danne Baxter, RN, MSN Rehab Admissions Coordinator [phone] 03/23/2021 1:28 PM

## 2021-03-23 NOTE — Plan of Care (Signed)
°  Problem: Pain Managment: Goal: General experience of comfort will improve Outcome: Progressing   Problem: Clinical Measurements: Goal: Will remain free from infection Outcome: Not Progressing Goal: Respiratory complications will improve Outcome: Not Progressing Note: Patient has multiple requests to have bipap removed. Patient has multiple requests and some periods of confusion. Patient desats to 70% on room air,

## 2021-03-23 NOTE — Progress Notes (Signed)
Physical Therapy Treatment Patient Details Name: Nicole Schmidt MRN: [id number] DOB: 09/20/1965 Today's Date: 03/23/2021   History of Present Illness Nicole Schmidt is a 18yoF who comes to [facility] on 03/17/21 after acute increase in edema limiting ability to walk. Pt was DC to home previous day from inpatient rehab in [place]. PMH: ESRD on HD TTS, DMN, HTN, HLD, AF, PE, anemia, dCHF. Pt was at CIR from 1/21-1/31.    PT Comments    Patient received in bed, she is agreeable to PT/OT session. Patient reports improvement in LE swelling since Monday. Demonstrates improved mobility and decreased assist level since last session. Requiring mod assist +1-2 for bed mobility, mod +2 assist for sit to stand and min guard +2 for side stepping at edge of bed. She will continue to benefit from skilled PT while here to improve functional independence, strength and safety with mobility.      Recommendations for follow up therapy are one component of a multi-disciplinary discharge planning process, led by the attending physician.  Recommendations may be updated based on patient status, additional functional criteria and insurance authorization.  Follow Up Recommendations  Acute inpatient rehab (3hours/day)     Assistance Recommended at Discharge Frequent or constant Supervision/Assistance  Patient can return home with the following Two people to help with walking and/or transfers;Assistance with cooking/housework;Help with stairs or ramp for entrance;Assist for transportation;A lot of help with bathing/dressing/bathroom   Equipment Recommendations  None recommended by PT    Recommendations for Other Services       Precautions / Restrictions Precautions Precautions: Fall Precaution Comments: 5 liters Restrictions Weight Bearing Restrictions: No     Mobility  Bed Mobility Overal bed mobility: Needs Assistance Bed Mobility: Supine to Sit, Sit to Supine     Supine to sit: Mod assist, HOB elevated Sit  to supine: Max assist   General bed mobility comments: mod assist for raising trunk to seated position and to scoot hips to edge of bed. Mod to max assist to return to supine from sitting edge of bed ( to raise LEs back up onto bed and guide trunk).    Transfers Overall transfer level: Needs assistance Equipment used: Rolling walker (2 wheels) Transfers: Sit to/from Stand Sit to Stand: Mod assist, +2 physical assistance, From elevated surface           General transfer comment: patient able to stand x 2 from elevated bed with +2 assist and rocking 3x for momentum    Ambulation/Gait Ambulation/Gait assistance: Min guard, +2 safety/equipment Gait Distance (Feet): 4 Feet Assistive device: Rolling walker (2 wheels) Gait Pattern/deviations: Step-to pattern, Decreased step length - right, Decreased step length - left Gait velocity: decreased     General Gait Details: patient able to take several side steps at edge of bed with +2 min guard. Fatigued with this.   Stairs             Wheelchair Mobility    Modified Rankin (Stroke Patients Only)       Balance Overall balance assessment: Needs assistance Sitting-balance support: Feet supported Sitting balance-Leahy Scale: Fair Sitting balance - Comments: demonstrates fair static sitting balance at EOB   Standing balance support: Bilateral upper extremity supported, During functional activity, Reliant on assistive device for balance Standing balance-Leahy Scale: Fair Standing balance comment: Able to take lateral steps at EOB with MIN GUARD (2 people on either side for safety)  Cognition Arousal/Alertness: Awake/alert Behavior During Therapy: WFL for tasks assessed/performed Overall Cognitive Status: Within Functional Limits for tasks assessed                                 General Comments: Pt A&Ox4, however endorses decreased memory compared to baseline         Exercises      General Comments        Pertinent Vitals/Pain Pain Assessment Pain Assessment: Faces Faces Pain Scale: Hurts little more Pain Location: bottom of feet d/t swelling Pain Descriptors / Indicators: Discomfort, Grimacing, Guarding, Sore Pain Intervention(s): Limited activity within patient's tolerance, Monitored during session, Repositioned    Home Living                          Prior Function            PT Goals (current goals can now be found in the care plan section) Acute Rehab PT Goals Patient Stated Goal: to get better, be more independent PT Goal Formulation: With patient Time For Goal Achievement: 04/04/21 Potential to Achieve Goals: Fair Progress towards PT goals: Progressing toward goals    Frequency    Min 2X/week      PT Plan Current plan remains appropriate    Co-evaluation PT/OT/SLP Co-Evaluation/Treatment: Yes Reason for Co-Treatment: For patient/therapist safety;To address functional/ADL transfers PT goals addressed during session: Mobility/safety with mobility OT goals addressed during session: ADL's and self-care      AM-PAC PT "6 Clicks" Mobility   Outcome Measure  Help needed turning from your back to your side while in a flat bed without using bedrails?: A Lot Help needed moving from lying on your back to sitting on the side of a flat bed without using bedrails?: A Lot Help needed moving to and from a bed to a chair (including a wheelchair)?: A Lot Help needed standing up from a chair using your arms (e.g., wheelchair or bedside chair)?: A Lot Help needed to walk in hospital room?: A Lot Help needed climbing 3-5 steps with a railing? : Total 6 Click Score: 11    End of Session Equipment Utilized During Treatment: Oxygen Activity Tolerance: Patient limited by fatigue;Patient limited by pain Patient left: in bed;with call bell/phone within reach Nurse Communication: Mobility status PT Visit Diagnosis: Pain;Other  abnormalities of gait and mobility (R26.89);Muscle weakness (generalized) (M62.81);Difficulty in walking, not elsewhere classified (R26.2) Pain - Right/Left:  (B feet/legs) Pain - part of body: Ankle and joints of foot     Time: 7902-4097 PT Time Calculation (min) (ACUTE ONLY): 25 min  Charges:  $Gait Training: 8-22 mins                     Brooklynn Brandenburg, PT, GCS 03/23/21,10:11 AM

## 2021-03-23 NOTE — Progress Notes (Addendum)
Patient is not tolerating bipap at this time. Patient has made multiple requests to have bipap removed for sips of water and mouth swabs. Patient immediately desats to the 70's without oxygen in place. Educated patient about the importance of maintaining continuous bipap. Informed the respiratory therapist about patient's intolerance, will attempt cpap for adherence.

## 2021-03-23 NOTE — Progress Notes (Signed)
[facility]  PROGRESS NOTE   Subjective:   Patient sitting up in bed Alert and oriented Per nursing notes, unable to tolerate Bipap overnight with minimal improvement of restlessness when placed on Cpap. Patient reports discomfort from [place]. Placed on 5L [place]   Objective:  Vital signs in last 24 hours:  Temp:  [98.7 F (37.1 C)-99 F (37.2 C)] 98.9 F (37.2 C) (02/08 0800) Pulse Rate:  [73-102] 77 (02/08 1000) Resp:  [9-30] 18 (02/08 1000) BP: (100-142)/(40-101) 110/82 (02/08 1000) SpO2:  [90 %-99 %] 93 % (02/08 1000)  Weight change:  Filed Weights   03/21/21 0500  Weight: (!) 165.8 kg    Intake/Output: I/O last 3 completed shifts: In: 0  Out: 2000 [Other:2000]   Intake/Output this shift:  Total I/O In: 360 [P.O.:360] Out: -   Physical Exam: General:  No acute distress, laying in bed, sleeping  Head:  Normocephalic, atraumatic. Moist oral mucosal membranes  Eyes:  Anicteric  Lungs:   Clear to auscultation, diminished at bases, 5L [place]  Heart:  irregular  Abdomen:   Soft, nontender, bowel sounds present, obese  Extremities:  + peripheral edema.  Neurologic:  nonfocal  Skin:  No lesions  Access:  RIJ permcath    Basic Metabolic Panel: Recent Labs  Lab 03/20/21 0939 03/20/21 1204 03/20/21 1429 03/21/21 0547 03/22/21 0417 03/23/21 0524  NA 134*  --  133* 133* 133* 134*  K 5.3*  --  4.8 4.2 4.2 3.7  CL 97*  --  97* 97* 98 99  CO2 30  --  27 28 29 27   GLUCOSE 132*  --  105* 100* 96 107*  BUN 36*  --  40* 32* 36* 36*  CREATININE 4.52* 4.54* 4.57* 3.46* 3.36* 3.35*  CALCIUM 8.7*  --  8.8* 8.6* 8.6* 8.7*  MG  --   --  2.0 1.8 2.0 1.9  PHOS  --   --  6.4* 4.5 4.7* 3.9     CBC: Recent Labs  Lab 03/20/21 0939 03/20/21 1204 03/20/21 1429 03/21/21 0547 03/22/21 0417  WBC 17.4* 14.6* 14.4* 13.7* 13.2*  NEUTROABS 13.5*  --   --   --  10.2*  HGB 8.0* 7.6* 7.8* 7.4* 7.5*  HCT 29.3* 27.6* 27.9* 26.0* 26.2*  MCV 101.4* 101.8* 100.0 95.9 97.4   PLT 266 234 244 235 229      Urinalysis: No results for input(s): COLORURINE, LABSPEC, PHURINE, GLUCOSEU, HGBUR, BILIRUBINUR, KETONESUR, PROTEINUR, UROBILINOGEN, NITRITE, LEUKOCYTESUR in the last 72 hours.  Invalid input(s): APPERANCEUR     Imaging: DG Chest Port 1 View  Result Date: 03/22/2021 CLINICAL DATA:  Atelectasis. EXAM: PORTABLE CHEST 1 VIEW COMPARISON:  March 21, 2021. FINDINGS: Stable cardiomegaly with central pulmonary vascular congestion. Right internal jugular dialysis catheter is unchanged in position. Mild bilateral pulmonary edema may be present. Right upper lobe atelectasis is noted. Bony thorax is unremarkable. IMPRESSION: Stable cardiomegaly with central pulmonary vascular congestion and mild bilateral pulmonary edema. Right upper lobe atelectasis. Electronically Signed   By: Marijo Conception M.D.   On: 03/22/2021 14:17     Medications:    sodium chloride Stopped (03/20/21 1758)    amLODipine  5 mg Oral Daily   apixaban  5 mg Oral BID   atorvastatin  20 mg Oral QHS   calcitRIOL  0.25 mcg Oral Daily   calcium carbonate  1 tablet Oral Daily   chlorhexidine  15 mL Mouth Rinse BID   Chlorhexidine Gluconate Cloth  6  each Topical q morning   cloNIDine  0.1 mg Oral Daily   epoetin (EPOGEN/PROCRIT) injection  10,000 Units Intravenous Q T,Th,Sa-HD   famotidine  10 mg Oral Daily   feeding supplement (NEPRO CARB STEADY)  237 mL Oral BID BM   folic acid  2,549 mcg Oral Daily   gabapentin  300 mg Oral QHS   Gerhardt's butt cream  3 application Topical TID   levothyroxine  112 mcg Oral QAC breakfast   mouth rinse  15 mL Mouth Rinse q12n4p   metoprolol tartrate  100 mg Oral BID   multivitamin  1 tablet Oral QHS   sevelamer carbonate  800 mg Oral TID WC   sodium chloride flush  3 mL Intravenous Q12H   Vitamin D (Ergocalciferol)  50,000 Units Oral Weekly    Assessment/ Plan:     Principal Problem:   Acute on chronic respiratory failure with hypoxia and hypercapnia  (HCC) Active Problems:   History of breast cancer   Obstructive sleep apnea   Morbid obesity with BMI of 60.0-69.9, adult (HCC)   Diabetes mellitus type 2 in obese (HCC)   Hypothyroidism   AF (paroxysmal atrial fibrillation) (HCC)   Physical deconditioning   Chronic respiratory failure with hypoxia (HCC)   ESRD (end stage renal disease) (HCC)   Debility   Acute respiratory failure with hypoxia (HCC)   Pressure injury of skin   Acute respiratory failure with hypoxia and hypercapnia ([place])   Ambulatory dysfunction  Ms. YONA KOSEK is a 56 y.o. female with morbid obesity, diabetes mellitus type II, peripheral vascular disease, hypertension, coronary artery disease, congestive heart failure, atrial fibrillation, acute kidney injury on chronic kidney disease requiring hemodialysis.  [facility] TTS [address] RIJ permcath 166.5kg  #1: End Stage Renal Disease: currently with volume overload and requiring daily dialysis. Received dialysis yesterday with UF 2L achieved. Adequate fluid removal achieved with daily dialysis. Will allow patient to rest and resume treatment on scheduled day tomorrow.   #2: Morbid obesity/obstructive sleep apnea: Pickwickian syndrome. Appreciate pulmonology input. Continue BIPAP at night.    #3: Hypertension: 110/82. Current regimen of amlodipine, metoprolol and clonidine. Continue current treatment.  #4: Anemia of chronic kidney disease. Hemoglobin 7.5. Most likely contributing to patient's overall condition. EPO with TTS schedule  #5. Secondary Hyperparathyroidism: sevelamer with meals.     LOS: [place], MD [facility] 2/8/202312:38 PM

## 2021-03-23 NOTE — Progress Notes (Signed)
[place] [facility] [place] NAME: Nicole Schmidt    MR#:  [id number]  DATE OF BIRTH:  1965-09-24  SUBJECTIVE:  patient currently is on 5 L nasal cannula oxygen she is awake alert having meaningful conversation. Friend at bedside. Having a relatively better day today. No issues with respiratory distress. Work with physical therapy abettor earlier. Denies any fever or urinary discomfort or dysuria.    Assessment and Plan:  56 yo female recently discharged home from [place] on 02/1.  She presented to [facility] ER on 02/2 due to an inability to ambulate up and down her steps due to worsening generalized swelling.  She does have a hx of ESRD and required EMS transport to her first outpatient HD session on 02/2.  However, due to hypotension she only had 1.7L of fluid removed during initial outpatient HD session.  Following her HD treatment she was transported to the ER for further evaluation of her generalized weakness and difficulty with ambulation.   Acute on chronic hypercapnia hypoxic respiratory failure multifactorial -- currently stable on 5 L nasal cannula -- wean as tolerated -- PRN bronchodilators  end-stage renal disease on hemodialysis/pulmonary edema -- continue dialysis with ultrafiltration. -- Patient tolerating it well. -- Nephrology consultation appreciated  acute on chronic diastolic congestive heart failure Hypertension Hyperlipidemia history of PE -- continue BP meds and statins -- continue eliquis  anemia of chronic disease -- hemoglobin stable -- continue to monitor  Hypothyroidism -- continue Synthroid  type II diabetes with end-stage renal disease -- sugars have been well-controlled -- continue sliding scale     VITALS:  Blood pressure (!) 122/59, pulse 75, temperature 97.7 F (36.5 C), temperature source Oral, resp. rate 20, height 5\' 4"  (1.626 m), weight (!) 165.8 kg, last menstrual period  07/17/2012, SpO2 98 %.  PHYSICAL EXAMINATION:   GENERAL:  56 y.o.-year-old patient lying in the bed with no acute distress. Morbidly obese HEENT: Head atraumatic, normocephalic. Oropharynx and nasopharynx clear.  LUNGS: decreased breath sounds bilaterally, no wheezing, rales, rhonchi.  CARDIOVASCULAR: S1, S2 normal. No murmurs, rubs, or gallops.  ABDOMEN: Soft, nontender, nondistended. Bowel sounds present.  EXTREMITIES: ++ bilateral  edema b/l.    NEUROLOGIC: nonfocal PSYCHIATRIC:  patient is alert and awake SKIN:  Pressure Injury 03/20/21 Sacrum Stage 2 -  Partial thickness loss of dermis presenting as a shallow open injury with a red, pink wound bed without slough. area of redenss and broken skin, previous pressure injury, healing (Active)  03/20/21 1920  Location: Sacrum  Location Orientation:   Staging: Stage 2 -  Partial thickness loss of dermis presenting as a shallow open injury with a red, pink wound bed without slough.  Wound Description (Comments): area of redenss and broken skin, previous pressure injury, healing  Present on Admission: Yes       LABORATORY PANEL:  CBC Recent Labs  Lab 03/22/21 0417  WBC 13.2*  HGB 7.5*  HCT 26.2*  PLT 229    Chemistries  Recent Labs  Lab 03/20/21 0939 03/20/21 1204 03/23/21 0524  NA 134*   < > 134*  K 5.3*   < > 3.7  CL 97*   < > 99  CO2 30   < > 27  GLUCOSE 132*   < > 107*  BUN 36*   < > 36*  CREATININE 4.52*   < > 3.35*  CALCIUM 8.7*   < > 8.7*  MG  --    < >  1.9  AST 20  --   --   ALT 22  --   --   ALKPHOS 92  --   --   BILITOT 0.4  --   --    < > = values in this interval not displayed.   Cardiac Enzymes No results for input(s): TROPONINI in the last 168 hours. RADIOLOGY:  DG Chest Port 1 View  Result Date: 03/22/2021 CLINICAL DATA:  Atelectasis. EXAM: PORTABLE CHEST 1 VIEW COMPARISON:  March 21, 2021. FINDINGS: Stable cardiomegaly with central pulmonary vascular congestion. Right internal jugular  dialysis catheter is unchanged in position. Mild bilateral pulmonary edema may be present. Right upper lobe atelectasis is noted. Bony thorax is unremarkable. IMPRESSION: Stable cardiomegaly with central pulmonary vascular congestion and mild bilateral pulmonary edema. Right upper lobe atelectasis. Electronically Signed   By: Marijo Conception M.D.   On: 03/22/2021 14:17      Procedures: Family communication : discussed with patient's cousin Manuela Schwartz on the phone Consults : nephrology, pulmonary CODE STATUS: full DVT Prophylaxis : eliquis Level of care: Med-Surg Status is: Inpatient Remains inpatient appropriate because: continue inpatient dialysis with ultrafiltration and TOC for discharge planning to [place] THIS PATIENT: 30 minutes.  >50% time spent on counselling and coordination of care  Note: This dictation was prepared with Dragon dictation along with smaller phrase technology. Any transcriptional errors that result from this process are unintentional.  Fritzi Mandes M.D    [facility]   CC: Primary care physician; Lenard Simmer, MD

## 2021-03-23 NOTE — Progress Notes (Signed)
Called by RN to try CPAP with patient as patient is being non compliant with BIPAP. Pt has been restless and awake throughout the night. Changed mode over to CPAP. Pt rested slightly better.

## 2021-03-23 NOTE — Progress Notes (Signed)
Occupational Therapy Treatment Patient Details Name: Nicole Schmidt MRN: [id number] DOB: [birth year] Today's Date: 03/23/2021   History of present illness Nicole Schmidt is a 2yoF who comes to [facility] on 03/17/21 after acute increase in edema limiting ability to walk. Pt was DC to home previous day from inpatient rehab in [place]. PMH: ESRD on HD TTS, DMN, HTN, HLD, AF, PE, anemia, dCHF. Pt was at CIR from 1/21-1/31.   OT comments  Pt seen for OT treatment on this date. Session coordinated with PT to maximize safety during functional/ADL transfers. Upon arrival to room, pt awake and seated upright in bed. Pt A&Ox4, however endorsed decreased memory compared to baseline. Pt currently requires only set-up assist for bed-level grooming tasks. Pt performed supine>sit transfer (with HOB elevated), requiring only MOD A to assist with trunk elevation this date. Once seated EOB, pt demonstrated fair static sitting balance, but required MAX A for seated LB dressing d/t poor dynamic sitting balance. Pt performed x2 sit>stand transfers with MOD A+2. While standing, pt reported pain in b/l feet, however was motivated to take lateral steps at EOB, requiring only MIN GUARD (2 people present for safety). Pt is making good progress toward goals and continues to benefit from skilled OT services to maximize return to PLOF and minimize risk of future falls, injury, caregiver burden, and readmission. Will continue to follow POC. Discharge recommendation remains appropriate.     Recommendations for follow up therapy are one component of a multi-disciplinary discharge planning process, led by the attending physician.  Recommendations may be updated based on patient status, additional functional criteria and insurance authorization.    Follow Up Recommendations  Acute inpatient rehab (3hours/day)    Assistance Recommended at Discharge Frequent or constant Supervision/Assistance  Patient can return home with the following  A lot  of help with walking and/or transfers;A lot of help with bathing/dressing/bathroom;Assistance with cooking/housework;Assist for transportation;Help with stairs or ramp for entrance   Equipment Recommendations  Other (comment) (defer to next venue of care)       Precautions / Restrictions Precautions Precautions: Fall Precaution Comments: 5 liters Restrictions Weight Bearing Restrictions: No       Mobility Bed Mobility Overal bed mobility: Needs Assistance       Supine to sit: Mod assist, HOB elevated Sit to supine: Max assist   General bed mobility comments: Requires MOD A for trunk elevation during supine>sit.    Transfers Overall transfer level: Needs assistance Equipment used: Rolling walker (2 wheels) Transfers: Sit to/from Stand Sit to Stand: Mod assist, +2 physical assistance                 Balance Overall balance assessment: Needs assistance Sitting-balance support: No upper extremity supported, Feet supported Sitting balance-Leahy Scale: Fair Sitting balance - Comments: demonstrates fair static sitting balance at EOB   Standing balance support: During functional activity, Reliant on assistive device for balance Standing balance-Leahy Scale: Fair Standing balance comment: Able to take lateral steps at EOB with MIN GUARD (2 people on either side for safety)                           ADL either performed or assessed with clinical judgement   ADL Overall ADL's : Needs assistance/impaired     Grooming: Wash/dry hands;Set up;Bed level Grooming Details (indicate cue type and reason): to wash hands with washcloth             Lower Body Dressing:  Maximal assistance;Sitting/lateral leans Lower Body Dressing Details (indicate cue type and reason): to don socks while seated EOB                      Cognition Arousal/Alertness: Awake/alert Behavior During Therapy: WFL for tasks assessed/performed Overall Cognitive Status: Within  Functional Limits for tasks assessed                                 General Comments: Pt A&Ox4, however endorses decreased memory compared to baseline                   Pertinent Vitals/ Pain       Pain Assessment Pain Assessment: Faces Faces Pain Scale: Hurts little more Pain Location: bottom of feet d/t swelling Pain Descriptors / Indicators: Grimacing, Guarding, Discomfort Pain Intervention(s): Monitored during session, Repositioned         Frequency  Min 2X/week        Progress Toward Goals  OT Goals(current goals can now be found in the care plan section)  Progress towards OT goals: Progressing toward goals  Acute Rehab OT Goals Patient Stated Goal: to go to rehab and be independent OT Goal Formulation: With patient Time For Goal Achievement: 04/04/21 Potential to Achieve Goals: Good  Plan Discharge plan remains appropriate;Frequency remains appropriate    Co-evaluation    PT/OT/SLP Co-Evaluation/Treatment: Yes Reason for Co-Treatment: For patient/therapist safety;To address functional/ADL transfers PT goals addressed during session: Mobility/safety with mobility;Balance OT goals addressed during session: ADL's and self-care      AM-PAC OT "6 Clicks" Daily Activity     Outcome Measure   Help from another person eating meals?: None Help from another person taking care of personal grooming?: A Little Help from another person toileting, which includes using toliet, bedpan, or urinal?: A Lot Help from another person bathing (including washing, rinsing, drying)?: A Lot Help from another person to put on and taking off regular upper body clothing?: A Little Help from another person to put on and taking off regular lower body clothing?: A Lot 6 Click Score: 16    End of Session Equipment Utilized During Treatment: Oxygen (5Ls)  OT Visit Diagnosis: Unsteadiness on feet (R26.81);Muscle weakness (generalized) (M62.81)   Activity Tolerance  Patient tolerated treatment well   Patient Left in bed;with call bell/phone within reach;with bed alarm set   Nurse Communication Mobility status        Time: 7915-0569 OT Time Calculation (min): 25 min  Charges: OT General Charges $OT Visit: 1 Visit OT Treatments $Self Care/Home Management : 8-22 mins  Fredirick Maudlin, OTR/L [place]

## 2021-03-23 NOTE — TOC Progression Note (Signed)
Transition of Care [facility]) - Progression Note    Patient Details  Name: Nicole Schmidt MRN: [id number] Date of Birth: 12-06-1965  Transition of Care [facility]) CM/SW Contact  Shelbie Hutching, RN Phone Number: 03/23/2021, 4:17 PM  Clinical Narrative:    Acute inpatient rehab at [facility] has agreed to accept patient, patient agrees to go back to [facility].  Admissions coordinator will start insurance authorization.     Expected Discharge Plan: IP Rehab Facility Barriers to Discharge: Insurance Authorization  Expected Discharge Plan and Services Expected Discharge Plan: [place]   Discharge Planning Services: CM Consult Post Acute Care Choice: IP Rehab Living arrangements for the past 2 months: Single Family Home                 DME Arranged: N/A DME Agency: NA       HH Arranged: RN, PT, OT HH Agency:  ([facility] [place])         Social Determinants of Health (SDOH) Interventions    Readmission Risk Interventions Readmission Risk Prevention Plan 02/11/2020 10/19/2018  Transportation Screening Complete Complete  PCP or Specialist Appt within 3-5 Days Not Complete -  Not Complete comments To be scheduled when discharged. -  [place] or Home Care Consult Complete -  Social Work Consult for [place] Planning/Counseling Complete Patient refused  Palliative Care Screening Complete Not Applicable  Medication Review Press photographer) Complete Complete  Some recent data might be hidden

## 2021-03-23 NOTE — Progress Notes (Signed)
NAME:  Nicole Schmidt, MRN:  [id number], DOB:  1965-03-11, LOS: 3 ADMISSION DATE:  03/17/2021, CONSULTATION DATE: 03/20/2021 REFERRING MD: Dr. Jari Pigg, CHIEF COMPLAINT: Acute encephalopathy   History of Present Illness:  This is a 56 yo female recently discharged home from [place] on 02/1.  She presented to [facility] ER on 02/2 due to an inability to ambulate up and down her steps due to worsening generalized swelling.  She does have a hx of ESRD and required EMS transport to her first outpatient HD session on 02/2.  However, due to hypotension she only had 1.7L of fluid removed during initial outpatient HD session.  Following her HD treatment she was transported to the ER for further evaluation of her generalized weakness and difficulty with ambulation  03/21/21- patient remains in anasarca unable to do PT/OT due to leg pain from tense swelling. She had HD daily for last 3 days still is very edematous. She is speaking in full sentences but is on 5L/min.  Will transition to Step down.  She will require CIR.   03/22/21 - patient resting in bed no distress, remains on 5L/min [place]. S/p HD remains with episodes of encephalopathy. Leucocytosis is trending down and renal function is stable.  May benefit from pRBC transfusion due to severe anemia.   ABG and repeat CXR today.   03/23/21 - patient being optimized for TRH downgrade today. She is more lucid without encephalopathy today  Pertinent  Medical History  ESRD on Hemodialysis  HFpEF (Echo 02/09/21 EF 60 to 65%) Acquired Hypothyroidism Anemia Arthritis Right Breast Cancer s/p Radiation Therapy (2014) Type II Diabetes Mellitus  Dyslipidemia  Essential HTN  Gout  Menopause Morbid Obesity  Persistent Atrial Fibrillation  Pulmonary Embolism on Eliquis Thyroid Goiter   Significant Hospital Events: Including procedures, antibiotic start and stop dates in addition to other pertinent events   02/1: Pt presented to [facility] ER with  generalized weakness and difficulty with ambulation due to worsening generalized edema.  Boarded in the ER for daily hemodialysis sessions to improve volume status in an attempt to discharge pt home with home health services 02/5: Pt developed acute on chronic hypercapnic respiratory failure and acute encephalopathy requiring continuous Bipap.  PCCM team contacted for ICU admission   Interim History / Subjective:  Pt somnolent on continuous Bipap settings: 18/8 with FiO2 @45 % but able to follow commands  Objective   Blood pressure 110/82, pulse 77, temperature 98.9 F (37.2 C), temperature source Oral, resp. rate 18, height 5\' 4"  (1.626 m), weight (!) 165.8 kg, last menstrual period 07/17/2012, SpO2 93 %.        Intake/Output Summary (Last 24 hours) at 03/23/2021 1037 Last data filed at 03/22/2021 1300 Gross per 24 hour  Intake 0 ml  Output 2000 ml  Net -2000 ml    Filed Weights   03/21/21 0500  Weight: (!) 165.8 kg    Examination: General: acute on chronically ill appearing female, somnolent on continuous Bipap  HENT: large neck unable to assess for JVD  Lungs: crackles and diminished throughout, even, non labored  Cardiovascular: irregular irregular, 2+ radial/1+ distal pulses, 3+ generalized edema  Abdomen: +BS x4, obese, soft, non tender, non distended,  Extremities: moves all extremities  Neuro: somnolent, follows commands, PERRL  GU: purewick in place   Resolved Hospital Problem list   N/A  Assessment & Plan:  Acute on chronic hypercapnic hypoxic respiratory failure secondary to pulmonary edema complicated by OSA/OHS and morbid obesity -  Continuous Bipap for now; pt will need Bipap or CPAP qhs  - Prn bronchodilator therapy  - HIGH RISK FOR MECHANICAL INTUBATION currently on [place] 5l/min     -ABG today  Persistent atrial fibrillation  HFpEF Hx: Dyslipidemia, HTN, and PE - Continuous telemetry monitoring  - If CT Head negative will start heparin gtt - Continue  outpatient antihypertensives and atorvastatin once able to tolerate po's   ESRD with hyperkalemia  Mild hyponatremia  - Trend BMP - Replace electrolytes as indicated - Nephrology consulted appreciate input~HD per recommendations  UTI - Trend WBC and monitor fever curve - Trend PCT  - Continue ceftriaxone - Follow cultures   Anemia of chronic kidney disease  - Trend CBC - Monitor for s/sx of bleeding and transfuse for hgb <7  Hypothyroidism - Continue synthroid  Type II diabetes mellitus  - CBG's q4hrs  - SSI   Acute encephalopathy suspected secondary to hypercapnia  - CT Head pending  - Avoid sedating medication for now - Frequent reorientation  Best Practice (right click and "Reselect all SmartList Selections" daily)   Diet/type: NPO DVT prophylaxis: LMWH GI prophylaxis: H2B Lines: Dialysis Catheter Foley:  N/A Code Status:  full code Last date of multidisciplinary goals of care discussion [N/A]  Labs   CBC: Recent Labs  Lab 03/20/21 0939 03/20/21 1204 03/20/21 1429 03/21/21 0547 03/22/21 0417  WBC 17.4* 14.6* 14.4* 13.7* 13.2*  NEUTROABS 13.5*  --   --   --  10.2*  HGB 8.0* 7.6* 7.8* 7.4* 7.5*  HCT 29.3* 27.6* 27.9* 26.0* 26.2*  MCV 101.4* 101.8* 100.0 95.9 97.4  PLT 266 234 244 235 229     Basic Metabolic Panel: Recent Labs  Lab 03/20/21 0939 03/20/21 1204 03/20/21 1429 03/21/21 0547 03/22/21 0417 03/23/21 0524  NA 134*  --  133* 133* 133* 134*  K 5.3*  --  4.8 4.2 4.2 3.7  CL 97*  --  97* 97* 98 99  CO2 30  --  27 28 29 27   GLUCOSE 132*  --  105* 100* 96 107*  BUN 36*  --  40* 32* 36* 36*  CREATININE 4.52* 4.54* 4.57* 3.46* 3.36* 3.35*  CALCIUM 8.7*  --  8.8* 8.6* 8.6* 8.7*  MG  --   --  2.0 1.8 2.0 1.9  PHOS  --   --  6.4* 4.5 4.7* 3.9    GFR: Estimated Creatinine Clearance: 29 mL/min (A) (by C-G formula based on SCr of 3.35 mg/dL (H)). Recent Labs  Lab 03/20/21 1204 03/20/21 1429 03/21/21 0547 03/22/21 0417  PROCALCITON  --   0.32 0.40 0.50  WBC 14.6* 14.4* 13.7* 13.2*  LATICACIDVEN 0.7 0.9  --   --      Liver Function Tests: Recent Labs  Lab 03/17/21 1618 03/20/21 0939 03/20/21 1429 03/23/21 0524  AST 27 20  --   --   ALT 25 22  --   --   ALKPHOS 94 92  --   --   BILITOT 0.4 0.4  --   --   PROT 7.3 7.2  --   --   ALBUMIN 3.2* 3.1* 2.8* 2.5*    No results for input(s): LIPASE, AMYLASE in the last 168 hours. No results for input(s): AMMONIA in the last 168 hours.  ABG    Component Value Date/Time   PHART 7.22 (L) 03/22/2021 1414   PCO2ART 79 (HH) 03/22/2021 1414   PO2ART 59 (L) 03/22/2021 1414   HCO3 32.3 (H) 03/22/2021 1414  TCO2 29.5 10/21/2014 1240   ACIDBASEDEF 0.9 03/20/2021 0939   O2SAT 84.2 03/22/2021 1414      Coagulation Profile: No results for input(s): INR, PROTIME in the last 168 hours.  Cardiac Enzymes: No results for input(s): CKTOTAL, CKMB, CKMBINDEX, TROPONINI in the last 168 hours.  HbA1C: Hgb A1c MFr Bld  Date/Time Value Ref Range Status  02/09/2021 03:43 PM 7.2 (H) 4.8 - 5.6 % Final    Comment:    (NOTE) Pre diabetes:          5.7%-6.4%  Diabetes:              >6.4%  Glycemic control for   <7.0% adults with diabetes   02/09/2020 01:40 PM 6.4 (H) 4.8 - 5.6 % Final    Comment:    (NOTE) Pre diabetes:          5.7%-6.4%  Diabetes:              >6.4%  Glycemic control for   <7.0% adults with diabetes     CBG: Recent Labs  Lab 03/21/21 1649 03/21/21 2119 03/22/21 0729 03/22/21 1313 03/22/21 1540  GLUCAP 125* 150* 91 83 80     Review of Systems:   Unable to assess pt somnolent   Past Medical History:  She,  has a past medical history of (HFpEF) heart failure with preserved ejection fraction ([place]), Acquired hypothyroidism (02/09/2020), Anemia, Arthritis, Breast cancer ([place]) (2014), Breast cancer of upper-inner quadrant of right female breast ([place]), CHF (congestive heart failure) ([place]), CKD (chronic kidney disease), stage III ([place]), Diabetes  mellitus type 2 in obese ([place]) (02/09/2020), Diabetes mellitus without complication ([place]), Dyslipidemia (02/09/2020), Essential hypertension, Gout, Hypothyroidism, Menopause, Morbid obesity ([place]), Persistent atrial fibrillation ([place]), Personal history of radiation therapy, Pulmonary embolism ([place]) (10/2014), and Thyroid goiter.   Surgical History:   Past Surgical History:  Procedure Laterality Date   BREAST BIOPSY Right 2014   breast ca   BREAST EXCISIONAL BIOPSY Right 07/19/2012   breast ca   BREAST SURGERY Right 2014   with sentinel node bx subareaolar duct excision   CARDIOVERSION N/A 11/28/2018   Procedure: CARDIOVERSION;  Surgeon: Minna Merritts, MD;  Location: [facility] ORS;  Service: Cardiovascular;  Laterality: N/A;   COLONOSCOPY WITH PROPOFOL N/A 01/30/2017   Procedure: COLONOSCOPY WITH PROPOFOL;  Surgeon: Christene Lye, MD;  Location: [facility] ENDOSCOPY;  Service: Endoscopy;  Laterality: N/A;   CYSTOSCOPY W/ URETERAL STENT PLACEMENT Left 02/14/2021   Procedure: CYSTOSCOPY WITH RETROGRADE PYELOGRAM/URETERAL STENT PLACEMENT;  Surgeon: Billey Co, MD;  Location: [facility] ORS;  Service: Urology;  Laterality: Left;   DIALYSIS/PERMA CATHETER INSERTION N/A 02/24/2021   Procedure: DIALYSIS/PERMA CATHETER INSERTION;  Surgeon: Algernon Huxley, MD;  Location: [place] CV LAB;  Service: Cardiovascular;  Laterality: N/A;     Social History:   reports that she has never smoked. She has never used smokeless tobacco. She reports that she does not drink alcohol and does not use drugs.   Family History:  Her family history includes Atrial fibrillation in her mother; Colon cancer (age of onset: 68) in her cousin; Diabetes in her father; Heart attack in her mother; Heart failure in her mother; Hypertension in her father and mother; Parkinson's disease in her father; Rheum arthritis in her mother. There is no history of Breast cancer.   Allergies No Known Allergies   Home Medications  Prior  to Admission medications   Medication Sig Start Date End Date Taking? Authorizing Provider  amLODipine (  NORVASC) 5 MG tablet Take 1 tablet (5 mg total) by mouth daily. 03/16/21  Yes Collins, Hervey Ard, PA-C  apixaban (ELIQUIS) 5 MG TABS tablet Take 1 tablet (5 mg total) by mouth 2 (two) times daily. 03/15/21  Yes Angiulli, Lavon Paganini, PA-C  atorvastatin (LIPITOR) 20 MG tablet Take 1 tablet (20 mg total) by mouth at bedtime. 03/15/21  Yes Angiulli, Lavon Paganini, PA-C  calcitRIOL (ROCALTROL) 0.25 MCG capsule Take 1 capsule (0.25 mcg total) by mouth daily. 03/15/21  Yes Angiulli, Lavon Paganini, PA-C  calcium carbonate (OSCAL) 1500 (600 Ca) MG TABS tablet Take 1 tablet (1,500 mg total) by mouth daily. 03/15/21  Yes Angiulli, Lavon Paganini, PA-C  cloNIDine (CATAPRES) 0.2 MG tablet Take 1 tablet (0.2 mg total) by mouth 2 (two) times daily. 03/15/21  Yes Angiulli, Lavon Paganini, PA-C  Dulaglutide 1.5 MG/0.5ML SOPN Inject 1.5 mg into the skin every 7 (seven) days. 03/15/21  Yes Angiulli, Lavon Paganini, PA-C  ergocalciferol (VITAMIN D2) 1.25 MG (50000 UT) capsule Take 1 capsule (50,000 Units total) by mouth once a week. 03/15/21  Yes Angiulli, Lavon Paganini, PA-C  folic acid (FOLVITE) 1 MG tablet Take 1 tablet (1 mg total) by mouth daily. 03/15/21  Yes Angiulli, Lavon Paganini, PA-C  gabapentin (NEURONTIN) 300 MG capsule Take 1 capsule (300 mg total) by mouth at bedtime. 03/15/21  Yes Angiulli, Lavon Paganini, PA-C  levothyroxine (SYNTHROID) 112 MCG tablet Take 1 tablet (112 mcg total) by mouth daily before breakfast. 03/15/21  Yes Angiulli, Lavon Paganini, PA-C  melatonin 5 MG TABS Take 2 tablets (10 mg total) by mouth at bedtime as needed (sleep). 03/15/21  Yes Angiulli, Lavon Paganini, PA-C  metoprolol tartrate (LOPRESSOR) 100 MG tablet Take 1 tablet (100 mg total) by mouth 2 (two) times daily. 03/15/21 04/14/21 Yes Angiulli, Lavon Paganini, PA-C  Nystatin (GERHARDT'S BUTT CREAM) CREA Apply 3 application topically 3 (three) times daily. 03/15/21  Yes Angiulli, Lavon Paganini, PA-C   sevelamer carbonate (RENVELA) 800 MG tablet Take 1 tablet (800 mg total) by mouth 3 (three) times daily with meals. 03/15/21  Yes Angiulli, Lavon Paganini, PA-C  traMADol (ULTRAM) 50 MG tablet Take 1 tablet (50 mg total) by mouth every 6 (six) hours as needed for moderate pain. 03/15/21  Yes Angiulli, Lavon Paganini, PA-C  acetaminophen (TYLENOL) 325 MG tablet Take 2 tablets (650 mg total) by mouth every 6 (six) hours as needed for mild pain (or Fever >/= 101). 03/15/21   Angiulli, Lavon Paganini, PA-C  albuterol (VENTOLIN HFA) 108 (90 Base) MCG/ACT inhaler Inhale 2 puffs into the lungs every 6 (six) hours as needed for wheezing or shortness of breath. 03/15/21   Angiulli, Lavon Paganini, PA-C  fluticasone-salmeterol (ADVAIR HFA) 230-21 MCG/ACT inhaler Inhale 2 puffs into the lungs 2 (two) times daily as needed.  Patient not taking: No sig reported  02/09/20  [provider]     Critical care provider statement:   Total critical care time: 33 minutes   Performed by: Lanney Gins MD   Critical care time was exclusive of separately billable procedures and treating other patients.   Critical care was necessary to treat or prevent imminent or life-threatening deterioration.   Critical care was time spent personally by me on the following activities: development of treatment plan with patient and/or surrogate as well as nursing, discussions with consultants, evaluation of patient's response to treatment, examination of patient, obtaining history from patient or surrogate, ordering and performing treatments and interventions, ordering and review of laboratory studies, ordering  and review of radiographic studies, pulse oximetry and re-evaluation of patient's condition.    Ottie Glazier, M.D.  Pulmonary & Critical Care Medicine

## 2021-03-24 LAB — PHOSPHORUS: Phosphorus: 5.1 mg/dL — ABNORMAL HIGH (ref 2.5–4.6)

## 2021-03-24 LAB — CBC
HCT: 31.8 % — ABNORMAL LOW (ref 36.0–46.0)
Hemoglobin: 9.1 g/dL — ABNORMAL LOW (ref 12.0–15.0)
MCH: 27.8 pg (ref 26.0–34.0)
MCHC: 28.6 g/dL — ABNORMAL LOW (ref 30.0–36.0)
MCV: 97.2 fL (ref 80.0–100.0)
Platelets: 310 10*3/uL (ref 150–400)
RBC: 3.27 MIL/uL — ABNORMAL LOW (ref 3.87–5.11)
RDW: 16.3 % — ABNORMAL HIGH (ref 11.5–15.5)
WBC: 22.3 10*3/uL — ABNORMAL HIGH (ref 4.0–10.5)
nRBC: 0.4 % — ABNORMAL HIGH (ref 0.0–0.2)

## 2021-03-24 LAB — MAGNESIUM: Magnesium: 2.3 mg/dL (ref 1.7–2.4)

## 2021-03-24 MED ORDER — EPOETIN ALFA 10000 UNIT/ML IJ SOLN
INTRAMUSCULAR | Status: AC
  Filled 2021-03-24: qty 1

## 2021-03-24 MED ORDER — FOSFOMYCIN TROMETHAMINE 3 G PO PACK
3.0000 g | PACK | ORAL | Status: DC
  Administered 2021-03-24: 3 g via ORAL
  Filled 2021-03-24: qty 3

## 2021-03-24 MED ORDER — HEPARIN SODIUM (PORCINE) 1000 UNIT/ML IJ SOLN
INTRAMUSCULAR | Status: AC
  Filled 2021-03-24: qty 10

## 2021-03-24 NOTE — H&P (Incomplete)
Physical Medicine and Rehabilitation Admission H&P     HPI: Nicole Schmidt is a 56 year old right-handed female with history of chronic hypoxemic respiratory failure on 2 L oxygen, diabetes mellitus, breast cancer, history of hematuria left ureteral stone with recent left uteroscopy with laser lithotripsy and stent exchange per Dr. Diamantina Providence, hypertension, hyperlipidemia, gout, morbid obesity with BMI 62.17, atrial fibrillation/pulmonary emboli on Eliquis.  Recent hospital admission 02/01/2021 - 02/22/2021 for management of acute on chronic diastolic congestive heart failure and end-stage renal disease with hemodialysis with permacath placed 02/25/2021.  Patient with CIR admit 03/04/2021 to 03/16/2021 for debility related to chronic renal failure superimposed with diastolic congestive heart failure.  She was discharged to home from CIR modified independent with a cane.  Per chart review patient lives alone.  1 level home 2 steps to entry.  Reports independence in self-care and independent ADL tasks at baseline.  Presented to [facility] 03/17/2021 with inability to ambulate and generalized lower extremity swelling as well as hypotension..  Patient became more somnolent unable to follow commands ABG showed pH 7.14/PCO2 89/bicarb 30.3 and patient placed on BiPAP.  Admission chemistry sodium 132 chloride 96 glucose 119 BUN 15 creatinine 1.99, hemoglobin 8.3, WBC 13,000, urinalysis negative nitrite, troponin negative, BNP 576, lactic acid 0.7.  Chest x-ray 03/20/2021 showed bilateral pulmonary opacities that were nonspecific.  Cranial CT scan negative for acute changes.  Renal service follow-up with hemodialysis ongoing.  Patient did remain on chronic Eliquis as prior to admission.  Patient continuing oxygen therapy as directed for history of chronic hypoxemic respiratory failure with follow-up chest x-ray 03/22/2021 stable with mild bilateral pulmonary edema.  Acute on chronic anemia latest hemoglobin 8.7- 9.1 noted  leukocytosis [id number] and monitored with fall per infectious disease currently maintained on IV Invanz x 4doses.  Urine culture positive for ESBL Proteus and E. coli treated empirically with 1 dose of fosfomycin 2/9.  Follow-up ultrasound showed resolution of left hydronephrosis.  Therapy evaluations completed due to patient decreased functional mobility was admitted for a comprehensive rehab program.  Review of Systems  Constitutional:  Positive for malaise/fatigue. Negative for chills and fever.  HENT:  Negative for hearing loss.   Eyes:  Negative for blurred vision and double vision.  Respiratory:  Positive for shortness of breath. Negative for cough and wheezing.   Cardiovascular:  Positive for palpitations and leg swelling. Negative for chest pain.  Gastrointestinal:  Positive for constipation. Negative for heartburn, nausea and vomiting.  Genitourinary:  Positive for hematuria. Negative for dysuria and flank pain.  Musculoskeletal:  Positive for joint pain and myalgias.  Skin:  Negative for rash.  Neurological:  Positive for weakness.  All other systems reviewed and are negative. Past Medical History:  Diagnosis Date   (HFpEF) heart failure with preserved ejection fraction ([place])    a. 10/2018 Echo: EF 60-65%, diast dysfxn, RVSP 50.7 mmHg, mildly dil LA.   Acquired hypothyroidism 02/09/2020   Anemia    Arthritis    Breast cancer ([place]) 2014   right breast cancer   Breast cancer of upper-inner quadrant of right female breast ([place])    Right breast invasive CA and DCIS , 7 mm T1,N0,M0. Er/PR pos, her 2 negative.  Margins 1 mm.   CHF (congestive heart failure) (HCC)    CKD (chronic kidney disease), stage III ([place])    Diabetes mellitus type 2 in obese ([place]) 02/09/2020   Diabetes mellitus without complication ([place])    Dyslipidemia 02/09/2020   Essential hypertension  Gout    Hypothyroidism    Menopause    age 72   Morbid obesity ([place])    Persistent atrial  fibrillation ([place])    a. Dx 10/2018 in setting of PNA. CHA2DS2VASc = 4-->Eliquis; b. 11/2018 s/p successful DCCV.   Personal history of radiation therapy    Pulmonary embolism ([place]) 10/2014   a. Chronic eliquis.   Thyroid goiter    Past Surgical History:  Procedure Laterality Date   BREAST BIOPSY Right 2014   breast ca   BREAST EXCISIONAL BIOPSY Right 07/19/2012   breast ca   BREAST SURGERY Right 2014   with sentinel node bx subareaolar duct excision   CARDIOVERSION N/A 11/28/2018   Procedure: CARDIOVERSION;  Surgeon: Minna Merritts, MD;  Location: [facility] ORS;  Service: Cardiovascular;  Laterality: N/A;   COLONOSCOPY WITH PROPOFOL N/A 01/30/2017   Procedure: COLONOSCOPY WITH PROPOFOL;  Surgeon: Christene Lye, MD;  Location: [facility] ENDOSCOPY;  Service: Endoscopy;  Laterality: N/A;   CYSTOSCOPY W/ URETERAL STENT PLACEMENT Left 02/14/2021   Procedure: CYSTOSCOPY WITH RETROGRADE PYELOGRAM/URETERAL STENT PLACEMENT;  Surgeon: Billey Co, MD;  Location: [facility] ORS;  Service: Urology;  Laterality: Left;   DIALYSIS/PERMA CATHETER INSERTION N/A 02/24/2021   Procedure: DIALYSIS/PERMA CATHETER INSERTION;  Surgeon: Algernon Huxley, MD;  Location: [facility];  Service: Cardiovascular;  Laterality: N/A;   Family History  Problem Relation Age of Onset   Diabetes Father    Hypertension Father    Parkinson's disease Father    Hypertension Mother    Rheum arthritis Mother    Atrial fibrillation Mother    Heart failure Mother    Heart attack Mother        died in her mid-70's.   Colon cancer Cousin 73   Breast cancer Neg Hx    Social History:  reports that she has never smoked. She has never used smokeless tobacco. She reports that she does not drink alcohol and does not use drugs. Allergies: No Known Allergies Medications Prior to Admission  Medication Sig Dispense Refill   amLODipine (NORVASC) 5 MG tablet Take 1 tablet (5 mg total) by mouth daily. 60 tablet 0   apixaban  (ELIQUIS) 5 MG TABS tablet Take 1 tablet (5 mg total) by mouth 2 (two) times daily. 60 tablet 0   atorvastatin (LIPITOR) 20 MG tablet Take 1 tablet (20 mg total) by mouth at bedtime. 30 tablet 0   calcitRIOL (ROCALTROL) 0.25 MCG capsule Take 1 capsule (0.25 mcg total) by mouth daily. 30 capsule 0   calcium carbonate (OSCAL) 1500 (600 Ca) MG TABS tablet Take 1 tablet (1,500 mg total) by mouth daily. 30 tablet 0   cloNIDine (CATAPRES) 0.2 MG tablet Take 1 tablet (0.2 mg total) by mouth 2 (two) times daily. 60 tablet 0   Dulaglutide 1.5 MG/0.5ML SOPN Inject 1.5 mg into the skin every 7 (seven) days. 0.5 mL 0   ergocalciferol (VITAMIN D2) 1.25 MG (50000 UT) capsule Take 1 capsule (50,000 Units total) by mouth once a week. 5 capsule 0   folic acid (FOLVITE) 1 MG tablet Take 1 tablet (1 mg total) by mouth daily. 30 tablet 0   gabapentin (NEURONTIN) 300 MG capsule Take 1 capsule (300 mg total) by mouth at bedtime. 30 capsule 0   levothyroxine (SYNTHROID) 112 MCG tablet Take 1 tablet (112 mcg total) by mouth daily before breakfast. 30 tablet 0   melatonin 5 MG TABS Take 2 tablets (10 mg total) by mouth at  bedtime as needed (sleep). 60 tablet 0   metoprolol tartrate (LOPRESSOR) 100 MG tablet Take 1 tablet (100 mg total) by mouth 2 (two) times daily. 60 tablet 0   Nystatin (GERHARDT'S BUTT CREAM) CREA Apply 3 application topically 3 (three) times daily. 3 each 0   sevelamer carbonate (RENVELA) 800 MG tablet Take 1 tablet (800 mg total) by mouth 3 (three) times daily with meals. 90 tablet 0   traMADol (ULTRAM) 50 MG tablet Take 1 tablet (50 mg total) by mouth every 6 (six) hours as needed for moderate pain. 30 tablet 0   acetaminophen (TYLENOL) 325 MG tablet Take 2 tablets (650 mg total) by mouth every 6 (six) hours as needed for mild pain (or Fever >/= 101).     albuterol (VENTOLIN HFA) 108 (90 Base) MCG/ACT inhaler Inhale 2 puffs into the lungs every 6 (six) hours as needed for wheezing or shortness of  breath. 8.5 g 0      Home: Home Living Family/patient expects to be discharged to:: Inpatient rehab Living Arrangements: Alone Available Help at Discharge: Friend(s), Available PRN/intermittently Type of Home: House Home Access: Stairs to enter [employer] of Steps: 2 steps in front with no rails and 4 steps in the back with 2 rails - plan to get railings installed on front steps (months back using back door consistently due to need for rails) Entrance Stairs-Rails: Right, Left, Can reach both Home Layout: One level Bathroom Shower/Tub: Multimedia programmer: Handicapped height Bathroom Accessibility: Yes Home Equipment: Cale - single point, Rollator (4 wheels), Conservation officer, nature (2 wheels), BSC/3in1, Wheelchair - manual, Shower seat Additional Comments: Pt currently lives in her mother's old home that is 1 level with level entry, but planning to move to a new house as soon as possible that's 1 level with 4 steps to enter with B rails. She does not want to return to mother's home as she believe it has mold.  Lives With: Alone   Functional History: Prior Function Prior Level of Function : Independent/Modified Independent Mobility Comments: Patient was using cane at inpatient rehab per her report ADLs Comments: Pt reports independence in self care and IADL tasks at baseline  Functional Status:  Mobility: Bed Mobility Overal bed mobility: Needs Assistance Bed Mobility: Supine to Sit, Sit to Supine Rolling: Max assist Supine to sit: Supervision, HOB elevated (extra time, 2 attempts to pull trunk to sitting EOB) Sit to supine: Max assist, +2 for physical assistance, +2 for safety/equipment, HOB elevated (assistance to lower trunk to bed, elevate BLE onto bed) General bed mobility comments: Pt agreeable to bed level activity only 2/2 fatigue from heatlh concerns and recent PT session. Participates in therapy session with bed in chair position. Transfers Overall  transfer level:  (not safe to attempt without +2 2/2 weakness) Equipment used: Rolling walker (2 wheels) Transfers: Sit to/from Stand Sit to Stand: Mod assist, +2 physical assistance, From elevated surface General transfer comment: patient able to stand x 2 from elevated bed with +2 assist and rocking 3x for momentum Ambulation/Gait Ambulation/Gait assistance: Min guard, +2 safety/equipment Gait Distance (Feet): 4 Feet Assistive device: Rolling walker (2 wheels) Gait Pattern/deviations: Step-to pattern, Decreased step length - right, Decreased step length - left General Gait Details: patient able to take several side steps at edge of bed with +2 min guard. Fatigued with this. Gait velocity: decreased    ADL: ADL Overall ADL's : Needs assistance/impaired Grooming: Wash/dry face, Oral care, Set up, Bed level Grooming Details (indicate  cue type and reason): Performs oral care and face washing with bed in chair position. Lower Body Dressing: Maximal assistance, Sitting/lateral leans Lower Body Dressing Details (indicate cue type and reason): to don socks while seated EOB Functional mobility during ADLs: +2 for physical assistance, Moderate assistance General ADL Comments: Pt presents endorsing generalized fatigue from current health concerns and recent PT session. She is agreeable to bed-level activity and requires SET UP assist for UB grooming tasks.  Cognition: Cognition Overall Cognitive Status: Within Functional Limits for tasks assessed Arousal/Alertness: Awake/alert Orientation Level: Oriented X4 Cognition Arousal/Alertness: Awake/alert Behavior During Therapy: WFL for tasks assessed/performed Overall Cognitive Status: Within Functional Limits for tasks assessed General Comments: Pleasant, conversational, A&Ox4. Endorsing significant fatigue since PT session but denies any other concerns this date.  Physical Exam: Blood pressure (!) 114/52, pulse 78, temperature 98.1 F (36.7  C), temperature source Axillary, resp. rate 20, height 5\' 4"  (1.626 m), weight (!) 161.1 kg, last menstrual period 07/17/2012, SpO2 98 %. Physical Exam Neurological:     Comments: Patient is alert.  Oriented x3 and follows commands.    Results for orders placed or performed during the hospital encounter of 03/17/21 (from the past 48 hour(s))  Blood gas, arterial     Status: Abnormal   Collection Time: 03/26/21  2:19 PM  Result Value Ref Range   FIO2 0.40    Delivery systems NASAL CANNULA    pH, Arterial 7.31 (L) 7.350 - 7.450   pCO2 arterial 51 (H) 32.0 - 48.0 mmHg   pO2, Arterial 84 83.0 - 108.0 mmHg   Bicarbonate 25.7 20.0 - 28.0 mmol/L   Acid-base deficit 0.8 0.0 - 2.0 mmol/L   O2 Saturation 95.2 %   Patient temperature 37.0    Collection site LEFT BRACHIAL    Sample type ARTERIAL DRAW    Allens test (pass/fail) PASS PASS    Comment: Performed at [facility], [address] [place], [place] [address]  Magnesium     Status: None   Collection Time: 03/27/21  2:15 AM  Result Value Ref Range   Magnesium 2.1 1.7 - 2.4 mg/dL    Comment: Performed at [facility], [address] [place], [place] [address]  Phosphorus     Status: None   Collection Time: 03/27/21  2:15 AM  Result Value Ref Range   Phosphorus 3.2 2.5 - 4.6 mg/dL    Comment: Performed at [facility], [address] [place], [place] [address]  CBC     Status: Abnormal   Collection Time: 03/27/21  2:15 AM  Result Value Ref Range   WBC 12.2 (H) 4.0 - 10.5 K/uL   RBC 3.01 (L) 3.87 - 5.11 MIL/uL   Hemoglobin 8.4 (L) 12.0 - 15.0 g/dL   HCT 29.0 (L) 36.0 - 46.0 %   MCV 96.3 80.0 - 100.0 fL   MCH 27.9 26.0 - 34.0 pg   MCHC 29.0 (L) 30.0 - 36.0 g/dL   RDW 16.6 (H) 11.5 - 15.5 %   Platelets 312 150 - 400 K/uL   nRBC 1.5 (H) 0.0 - 0.2 %    Comment: Performed at [facility], [address] [place], [place] [address]  Glucose, capillary     Status: Abnormal   Collection Time:  03/27/21  9:11 PM  Result Value Ref Range   Glucose-Capillary 138 (H) 70 - 99 mg/dL    Comment: Glucose reference range applies only to samples taken after fasting for at least 8  hours.   DG Chest Port 1 View  Result Date: 03/26/2021 CLINICAL DATA:  Acute respiratory failure. EXAM: PORTABLE CHEST 1 VIEW COMPARISON:  Chest radiograph dated 03/22/2021. FINDINGS: Dialysis catheter in similar position. Similar appearance of cardiomegaly with vascular congestion and edema. Patchy area of opacity in the right upper lung field progressed since the prior radiograph and may represent atelectasis or developing infiltrate. No large pleural effusion. No pneumothorax. Atherosclerotic calcification of the aorta. No acute osseous pathology. Degenerative changes of the spine. IMPRESSION: 1. Cardiomegaly with vascular congestion and edema. 2. Patchy area of opacity in the right upper lung field progressed since the prior radiograph and may represent atelectasis or developing infiltrate. Electronically Signed   By: Anner Crete M.D.   On: 03/26/2021 19:25      Blood pressure (!) 114/52, pulse 78, temperature 98.1 F (36.7 C), temperature source Axillary, resp. rate 20, height 5\' 4"  (1.626 m), weight (!) 161.1 kg, last menstrual period 07/17/2012, SpO2 98 %.  Medical Problem List and Plan: 1. Functional deficits secondary to debility/acute on chronic hypercapnia respiratory failure.  Oxygen therapy as directed/BiPAP  -patient may *** shower  -ELOS/Goals: *** 2.  Antithrombotics: -DVT/anticoagulation:  Pharmaceutical: Other (comment) Eliquis  -antiplatelet therapy: N/A 3. Pain Management: Neurontin 300 mg nightly, tramadol as needed 4. Mood: Melatonin as needed  -antipsychotic agents: N/A 5. Neuropsych: This patient is capable of making decisions on her own behalf. 6. Skin/Wound Care: Routine skin checks 7. Fluids/Electrolytes/Nutrition: Routine in and outs with follow-up chemistries 8.  End-stage renal  disease.  Continue hemodialysis as per renal services.  Permacath placement 02/25/2021 9.  Hypertension.   Norvasc 5 mg daily, Toprol XL 25 mg daily.  Monitor with increased mobility 10.  History of atrial fibrillation/pulmonary emboli/acute on chronic CHF.  Continue Eliquis.  Cardiac rate controlled.  Continue beta-blocker.Monitor for fluid overload 11.  Hypothyroidism.  Synthroid 12.  Hyperlipidemia.  Lipitor 13.  History of chronic anemia. 14.  Diabetes mellitus.  Latest hemoglobin A1c 7.2.  SSI 15.  History of hematuria.  Left ureteral stone with recent ureteroscopy/laser lithotripsy.  Repeat ultrasound showed resolution of left hydronephrosis.  Follow-up Dr.Sninsky. 16.  Morbid obesity.  BMI 62.74.  Dietary follow-up 17.  Acute blood loss anemia/leukocytosis.  Follow-up chemistries 18.  Urine culture positive ESBL/Proteus/E. coli.  Treat empirically with  one dose fosfomycin 2/9.  Contact precautions. 19.Leukocytosis.  WBC improving.  Follow-up per infectious disease Dr.Ravisankar and presently maintained on IV Invanz x 4 does. Lavon Paganini Forever Arechiga, PA-C 03/28/2021

## 2021-03-24 NOTE — Progress Notes (Signed)
OT Cancellation Note  Patient Details Name: Nicole Schmidt MRN: [id number] DOB: 1965-11-30   Cancelled Treatment:    Reason Eval/Treat Not Completed: Patient at procedure or test/ unavailable. Pt at dialysis, unavailable for OT tx. Will re-attempt at later date/time as pt is available.   Ardeth Perfect., MPH, MS, OTR/L ascom [phone] 03/24/21, 8:55 AM

## 2021-03-24 NOTE — Progress Notes (Signed)
Mobility Specialist - Progress Note   03/24/21 1140  Mobility  Activity Off unit     Pt off unit for HD tx. Will attempt session another date/time as pt is available.    Kathee Delton Mobility Specialist 03/24/21, 11:41 AM

## 2021-03-24 NOTE — PMR Pre-admission (Signed)
PMR Admission Coordinator Pre-Admission Assessment  Patient: Nicole Schmidt is an 55 y.o., female MRN: [id number] DOB: 03-15-1965 Height: 5\' 4"  (162.6 cm) Weight: (!) 161.1 kg  Insurance Information HMO:     PPO:      PCP:      IPA:      80/20:      OTHER:  PRIMARY: [employer]       Policy#: [id number]      Subscriber: pt CM Name: approved via fax      Phone#: [phone]     Fax#: [phone] Pre-Cert#: [id number]  approved for 7 days    Employer: 2[employer] Benefits:  Phone #: [phone]     Name: 2/8 Eff. Date: 02/18/19     Deduct: $250      Out of Pocket Max: $2000      Life Max: none CIR: 80%      SNF: 80% Outpatient: 80%     Co-Pay: limited to 18 combined visits per year Home Health: 80%      Co-Pay: 20% DME: 80%     Co-Pay: 20% Providers: in network  SECONDARY: none      Policy#:      Phone#:   Development worker, community:       Phone#:   The Actuary for patients in Inpatient Rehabilitation Facilities with attached Privacy Act [place] Records was provided and verbally reviewed with: N/A  Emergency Contact Information Contact Information     Name Relation Home Work Mobile   Eudy,Susan Relative [phone]  [phone]   Bowman,Connie Relative [phone]     cates,christine Denman George   [phone]      Current Medical History  Patient Admitting Diagnosis: Debility  History of Present Illness:  Nicole Schmidt is a 56 year old right-handed female with history of chronic hypoxemic respiratory failure on 2 L oxygen, diabetes mellitus, breast cancer, history of hematuria left ureteral stone with recent left uteroscopy with laser lithotripsy and stent exchange per Dr. Diamantina Providence, hypertension, hyperlipidemia, gout, morbid obesity with BMI 62.17, atrial fibrillation/pulmonary emboli on Eliquis.  Recent hospital admission 02/01/2021 - 02/22/2021 for management of acute on chronic diastolic congestive heart failure and end-stage renal disease with  hemodialysis with permacath placed 02/25/2021.  Patient with CIR admit 03/04/2021 to 03/16/2021 for debility related to chronic renal failure superimposed with diastolic congestive heart failure.  She was discharged to home from CIR modified independent with a cane.  Per chart review patient lives alone.  1 level home 2 steps to entry.  Reports independence in self-care and independent ADL tasks at baseline.  Presented to [facility] 03/17/2021 with inability to ambulate and generalized lower extremity swelling as well as hypotension..  Patient became more somnolent unable to follow commands ABG showed pH 7.14/PCO2 89/bicarb 30.3 and patient placed on BiPAP.  Admission chemistry sodium 132 chloride 96 glucose 119 BUN 15 creatinine 1.99, hemoglobin 8.3, WBC 13,000, urinalysis negative nitrite, troponin negative, BNP 576, lactic acid 0.7.  Chest x-ray 03/20/2021 showed bilateral pulmonary opacities that were nonspecific.  Cranial CT scan negative for acute changes.  Renal service follow-up with hemodialysis ongoing.  Patient did remain on chronic Eliquis as prior to admission.  Patient continuing oxygen therapy as directed for history of chronic hypoxemic respiratory failure with follow-up chest x-ray 03/22/2021 stable with mild bilateral pulmonary edema.  Acute on chronic anemia latest hemoglobin 8.7- 9.1 noted leukocytosis [id number] and monitored with fall per infectious disease currently maintained on IV Invanz x 4doses.  Urine culture positive  for ESBL Proteus and E. coli treated empirically with 1 dose of fosfomycin 2/9.  Follow-up ultrasound showed resolution of left hydronephrosis.  Therapy evaluations completed due to patient decreased functional mobility was recommended for a comprehensive rehab program.  Patient's medical record from [facility] has been reviewed by the rehabilitation admission coordinator and physician.  Past Medical History  Past Medical History:  Diagnosis Date   (HFpEF) heart  failure with preserved ejection fraction ([place])    a. 10/2018 Echo: EF 60-65%, diast dysfxn, RVSP 50.7 mmHg, mildly dil LA.   Acquired hypothyroidism 02/09/2020   Anemia    Arthritis    Breast cancer ([place]) 2014   right breast cancer   Breast cancer of upper-inner quadrant of right female breast ([place])    Right breast invasive CA and DCIS , 7 mm T1,N0,M0. Er/PR pos, her 2 negative.  Margins 1 mm.   CHF (congestive heart failure) (HCC)    CKD (chronic kidney disease), stage III (HCC)    Diabetes mellitus type 2 in obese ([place]) 02/09/2020   Diabetes mellitus without complication ([place])    Dyslipidemia 02/09/2020   Essential hypertension    Gout    Hypothyroidism    Menopause    age 31   Morbid obesity ([place])    Persistent atrial fibrillation ([place])    a. Dx 10/2018 in setting of PNA. CHA2DS2VASc = 4-->Eliquis; b. 11/2018 s/p successful DCCV.   Personal history of radiation therapy    Pulmonary embolism ([place]) 10/2014   a. Chronic eliquis.   Thyroid goiter    Has the patient had major surgery during 100 days prior to admission? No  Family History   family history includes Atrial fibrillation in her mother; Colon cancer (age of onset: 53) in her cousin; Diabetes in her father; Heart attack in her mother; Heart failure in her mother; Hypertension in her father and mother; Parkinson's disease in her father; Rheum arthritis in her mother.  Current Medications  Current Facility-Administered Medications:    0.9 %  sodium chloride infusion, 250 mL, Intravenous, PRN, Flora Lipps, MD, Stopped at 03/20/21 1758   acetaminophen (TYLENOL) tablet 650 mg, 650 mg, Oral, Q6H PRN, Rada Hay, MD, 650 mg at 03/20/21 1946   acetaminophen (TYLENOL) tablet 650 mg, 650 mg, Oral, Q4H PRN, Mortimer Fries, Kurian, MD   albuterol (PROVENTIL) (2.5 MG/3ML) 0.083% nebulizer solution 3 mL, 3 mL, Inhalation, Q6H PRN, Rada Hay, MD, 3 mL at 03/28/21 1012   amLODipine (NORVASC) tablet 5 mg, 5 mg, Oral, Daily,  Rada Hay, MD, 5 mg at 03/28/21 3762   apixaban (ELIQUIS) tablet 5 mg, 5 mg, Oral, BID, Benita Gutter, RPH, 5 mg at 03/28/21 0840   atorvastatin (LIPITOR) tablet 20 mg, 20 mg, Oral, QHS, Rada Hay, MD, 20 mg at 03/27/21 2250   bisacodyl (DULCOLAX) suppository 10 mg, 10 mg, Rectal, Daily, Fritzi Mandes, MD, 10 mg at 03/26/21 8315   calcitRIOL (ROCALTROL) capsule 0.25 mcg, 0.25 mcg, Oral, Daily, Rada Hay, MD, 0.25 mcg at 03/27/21 1015   chlorhexidine (PERIDEX) 0.12 % solution 15 mL, 15 mL, Mouth Rinse, BID, Kasa, Kurian, MD, 15 mL at 03/28/21 0840   Chlorhexidine Gluconate Cloth 2 % PADS 6 each, 6 each, Topical, q morning, Richarda Osmond, MD, 6 each at 03/27/21 1500   epoetin alfa (EPOGEN) injection 10,000 Units, 10,000 Units, Intravenous, Q T,Th,Sa-HD, Kolluru, Sarath, MD, 10,000 Units at 03/26/21 1621   ertapenem (INVANZ) 500 mg in sodium chloride 0.9 %  50 mL IVPB, 500 mg, Intravenous, Q24H, Fritzi Mandes, MD   famotidine (PEPCID) tablet 10 mg, 10 mg, Oral, Daily, Benita Gutter, RPH, 10 mg at 03/28/21 6712   feeding supplement (NEPRO CARB STEADY) liquid 237 mL, 237 mL, Oral, BID BM, Ottie Glazier, MD, Rate Change at 45/80/99 8338   folic acid (FOLVITE) tablet 1 mg, 1,000 mcg, Oral, Daily, Rada Hay, MD, 1 mg at 03/28/21 0840   gabapentin (NEURONTIN) capsule 300 mg, 300 mg, Oral, QHS, Rada Hay, MD, 300 mg at 03/27/21 2250   Gerhardt's butt cream 3 application, 3 application, Topical, TID, Rada Hay, MD, 3 application at 25/05/39 0554   levothyroxine (SYNTHROID) tablet 112 mcg, 112 mcg, Oral, QAC breakfast, Rada Hay, MD, 112 mcg at 03/28/21 0555   MEDLINE mouth rinse, 15 mL, Mouth Rinse, q12n4p, Kasa, Kurian, MD, 15 mL at 03/27/21 1335   melatonin tablet 10 mg, 10 mg, Oral, QHS PRN, Rada Hay, MD, 10 mg at 03/27/21 2313   metoprolol succinate (TOPROL-XL) 24 hr tablet 25 mg, 25 mg, Oral, Daily, Fritzi Mandes, MD, 25 mg at  03/28/21 0840   multivitamin (RENA-VIT) tablet 1 tablet, 1 tablet, Oral, QHS, Aleskerov, Fuad, MD, 1 tablet at 03/27/21 2250   ondansetron (ZOFRAN) injection 4 mg, 4 mg, Intravenous, Q6H PRN, Mortimer Fries, Kurian, MD   polyethylene glycol (MIRALAX / GLYCOLAX) packet 17 g, 17 g, Oral, Daily PRN, Mortimer Fries, Maretta Bees, MD, 17 g at 03/23/21 7673   sevelamer carbonate (RENVELA) tablet 800 mg, 800 mg, Oral, TID WC, Rada Hay, MD, 800 mg at 03/28/21 0839   sodium chloride flush (NS) 0.9 % injection 3 mL, 3 mL, Intravenous, Q12H, Kasa, Kurian, MD, 3 mL at 03/27/21 2132   sodium chloride flush (NS) 0.9 % injection 3 mL, 3 mL, Intravenous, PRN, Flora Lipps, MD   traMADol (ULTRAM) tablet 50 mg, 50 mg, Oral, Q6H PRN, Rada Hay, MD, 50 mg at 03/24/21 1757   Vitamin D (Ergocalciferol) (DRISDOL) capsule 50,000 Units, 50,000 Units, Oral, Weekly, Rada Hay, MD, 50,000 Units at 03/23/21 0813  Patients Current Diet:  Diet Order             Diet - low sodium heart healthy           Diet renal with fluid restriction Fluid restriction: 1200 mL Fluid; Room service appropriate? Yes with Assist; Fluid consistency: Thin  Diet effective now                  Precautions / Restrictions Precautions Precautions: Fall Precaution Comments: O2 Restrictions Weight Bearing Restrictions: No RLE Weight Bearing: Partial weight bearing LLE Weight Bearing: Partial weight bearing   Has the patient had 2 or more falls or a fall with injury in the past year? No  Prior Activity Level Mod I with Cane with discharge from [facility] CIR 03/16/21  Prior Functional Level Self Care: Did the patient need help bathing, dressing, using the toilet or eating? Independent  Indoor Mobility: Did the patient need assistance with walking from room to room (with or without device)? Independent  Stairs: Did the patient need assistance with internal or external stairs (with or without device)? Independent  Functional Cognition:  Did the patient need help planning regular tasks such as shopping or remembering to take medications? Independent  Patient Information Are you of Hispanic, Latino/a,or Spanish origin?: A. No, not of Hispanic, Latino/a, or Spanish origin What is your race?: A. White Do you need or want  an interpreter to communicate with a doctor or health care staff?: 0. No  Patient's Response To:  Health Literacy and Transportation Is the patient able to respond to health literacy and transportation needs?: Yes Health Literacy - How often do you need to have someone help you when you read instructions, pamphlets, or other written material from your doctor or pharmacy?: Never In the past 12 months, has lack of transportation kept you from medical appointments or from getting medications?: No In the past 12 months, has lack of transportation kept you from meetings, work, or from getting things needed for daily living?: No  Home Assistive Devices / [place] Devices/Equipment: [place]: Radio producer - single point, Rollator (4 wheels), Conservation officer, nature (2 wheels), BSC/3in1, Wheelchair - manual, Shower seat  Prior Device Use: Indicate devices/aids used by the patient prior to current illness, exacerbation or injury?  cane  Current Functional Level Cognition  Arousal/Alertness: Awake/alert Overall Cognitive Status: Within Functional Limits for tasks assessed Orientation Level: Oriented X4 General Comments: Pleasant, conversational, A&Ox4. Endorsing fatigue    Extremity Assessment (includes Sensation/Coordination)  Upper Extremity Assessment: Generalized weakness  Lower Extremity Assessment: Generalized weakness LLE: Unable to fully assess due to pain LLE Coordination: decreased gross motor    ADLs  Overall ADL's : Needs assistance/impaired Grooming: Wash/dry face, Oral care, Set up, Bed level Grooming Details (indicate cue type and reason): Performs oral care and face washing with  bed in chair position. Lower Body Dressing: Maximal assistance, Sitting/lateral leans Lower Body Dressing Details (indicate cue type and reason): to don socks while seated EOB Functional mobility during ADLs: +2 for physical assistance, Moderate assistance General ADL Comments: Pt presents endorsing generalized fatigue from current health concerns and recent PT session. She is agreeable to bed-level activity and requires SET UP assist for UB grooming tasks.    Mobility  Overal bed mobility: Needs Assistance Bed Mobility: Supine to Sit, Sit to Supine Rolling: Max assist Supine to sit: Min assist, HOB elevated Sit to supine: Min assist General bed mobility comments: Heavy use of bed rail for supine to sit. Min assist to elevate trunk and min assist to return B LEs back up to bed.    Transfers  Overall transfer level: Needs assistance Equipment used: Rolling walker (2 wheels) Transfers: Sit to/from Stand Sit to Stand: Min assist, +2 physical assistance General transfer comment: patient able to stand x 2 from elevated bed with +2 assist and rocking 3x for momentum    Ambulation / Gait / Stairs / Wheelchair Mobility  Ambulation/Gait Ambulation/Gait assistance: Min assist, +2 physical assistance Gait Distance (Feet): 5 Feet Assistive device: Rolling walker (2 wheels) Gait Pattern/deviations: Step-to pattern, Decreased step length - right, Decreased step length - left General Gait Details: Side steps at edge of bed. Min assist +2. Fatigued with this. Difficulty lifting LEs to take steps Gait velocity: decreased    Posture / Balance Dynamic Sitting Balance Sitting balance - Comments: demonstrates fair static sitting balance at EOB Balance Overall balance assessment: Needs assistance Sitting-balance support: Bilateral upper extremity supported Sitting balance-Leahy Scale: Fair Sitting balance - Comments: demonstrates fair static sitting balance at EOB Standing balance support: Bilateral  upper extremity supported Standing balance-Leahy Scale: Fair Standing balance comment: Able to take lateral steps at EOB with MIN GUARD (2 people on either side for safety)    Special needs/care consideration OP dialysis T Th Sat   Previous Home Environment  Living Arrangements: Alone  Lives With: Alone Available Help at Discharge:  Friend(s), Available PRN/intermittently Type of Home: House Home Layout: One level Home Access: Stairs to enter Entrance Stairs-Rails: Right, Left, Can reach both Entrance Stairs-Number of Steps: 2 steps in front with no rails and 4 steps in the back with 2 rails - plan to get railings installed on front steps (months back using back door consistently due to need for rails) Bathroom Shower/Tub: Multimedia programmer: Handicapped height Bathroom Accessibility: Yes How Accessible: Accessible via walker Home Care Services: No Additional Comments: Pt currently lives in her mother's old home that is 1 level with level entry, but planning to move to a new house as soon as possible that's 1 level with 4 steps to enter with B rails. She does not want to return to mother's home as she believe it has mold.  Discharge Living Setting Plans for Discharge Living Setting: Patient's home Type of Home at Discharge: House Discharge Home Layout: One level Discharge Home Access: Stairs to enter Entrance Stairs-Rails: None Entrance Stairs-Number of Steps: Front has 2 steps no rails; Back has 4 steps with rials in the dorr to pull up on Discharge Bathroom Shower/Tub: Walk-in shower Discharge Bathroom Toilet: Handicapped height Discharge Bathroom Accessibility: Yes How Accessible: Accessible via walker Does the patient have any problems obtaining your medications?: No  Home Health to be with [facility] on 2/3 per [facility] CM/ Patient's home address is [place]. I have spoken with she and her cousin by phon eon 2/9 to request they make contacts to have ramp  built and to arrange transportation for outpatient dialysis prior to discharge form CIR  Social/Family/Support Systems Contact Information: Manuela Schwartz, cousin Anticipated Caregiver: Susie Anticipated Caregiver's Contact Information: see contacts Ability/Limitations of Caregiver: cousin intermittent Caregiver Availability: Intermittent Discharge Plan Discussed with Primary Caregiver: Yes Is Caregiver In Agreement with Plan?: Yes Does Caregiver/Family have Issues with Lodging/Transportation while Pt is in Rehab?: No  Goals Patient/Family Goal for Rehab: Mod I with PT and OT Expected length of stay: ELOS 7 to 10 days Additional Information: discussed on 2/9 the need for patient and her cousin to have a ramp built and to arrange [facility] transportation to her op dialysis treatments Pt/Family Agrees to Admission and willing to participate: Yes Program Orientation Provided & Reviewed with Pt/Caregiver Including Roles  & Responsibilities: Yes  Barriers to Discharge: Decreased caregiver support, Inaccessible home environment  Decrease burden of Care through IP rehab admission: n/a  Possible need for SNF placement upon discharge: not anticipated  Patient Condition: I have reviewed medical records from [facility], spoken with CM, and patient. I discussed via phone for inpatient rehabilitation assessment.  Patient will benefit from ongoing PT and OT, can actively participate in 3 hours of therapy a day 5 days of the week, and can make measurable gains during the admission.  Patient will also benefit from the coordinated team approach during an Inpatient Acute Rehabilitation admission.  The patient will receive intensive therapy as well as Rehabilitation physician, nursing, social worker, and care management interventions.  Due to bladder management, bowel management, safety, skin/wound care, disease management, medication administration, pain management, and patient education the patient requires 24 hour a day  rehabilitation nursing.  The patient is currently mod assist overall with mobility and basic ADLs.  Discharge setting and therapy post discharge at home with home health is anticipated.  Patient has agreed to participate in the Acute Inpatient Rehabilitation Program and will admit today.  Preadmission Screen Completed By: Danne Baxter RN MSN with updates by  Michel Santee, 03/28/2021 10:45 AM ______________________________________________________________________   Discussed status with Dr. Letta Pate on 03/28/21  at 10:46 AM  and received approval for admission today.  Admission Coordinator: Danne Baxter RN MSN with updates by Michel Santee, PT, time 10:46 AM Sudie Grumbling 03/28/21    Assessment/Plan: Diagnosis: Debility secondary to respiratory failure Does the need for close, 24 hr/day Medical supervision in concert with the patient's rehab needs make it unreasonable for this patient to be served in a less intensive setting? Yes Co-Morbidities requiring supervision/potential complications: End-stage renal disease on hemodialysis, A-fib on Eliquis, beta-blocker for rate control, history of pulmonary edema, continue Eliquis, history of CHF monitor input output breathing and lower extremity swelling. Due to bladder management, bowel management, safety, skin/wound care, disease management, medication administration, pain management, and patient education, does the patient require 24 hr/day rehab nursing? Yes Does the patient require coordinated care of a physician, rehab nurse, PT, OT, and SLP to address physical and functional deficits in the context of the above medical diagnosis(es)? Yes Addressing deficits in the following areas: balance, endurance, locomotion, strength, transferring, bowel/bladder control, bathing, dressing, feeding, grooming, toileting, cognition, and psychosocial support Can the patient actively participate in an intensive therapy program of at least 3 hrs of therapy 5 days a  week? Yes The potential for patient to make measurable gains while on inpatient rehab is good Anticipated functional outcomes upon discharge from inpatient rehab: modified independent PT, modified independent OT, n/a SLP Estimated rehab length of stay to reach the above functional goals is: 7 to 10 days Anticipated discharge destination: Home 10. Overall Rehab/Functional Prognosis: good   MD Signature: Charlett Blake M.D. [place] [facility] Fellow Am Acad of Phys Med and Rehab Diplomate Am Board of Electrodiagnostic Med Fellow Am Board of Interventional Pain

## 2021-03-24 NOTE — Progress Notes (Signed)
[facility] NAME: Nicole Schmidt    MR#:  [id number]  DATE OF BIRTH:  08/05/1965  SUBJECTIVE:  patient currently is on 5 L nasal cannula oxygen patient had dialysis earlier. Very sleepy. Although did wake up and talk with me. Denies symptoms of dysuria. No fever. No new issues per RN.    Assessment and Plan:  56 yo female recently discharged home from [place] on 02/1.  She presented to [facility] ER on 02/2 due to an inability to ambulate up and down her steps due to worsening generalized swelling.  She does have a hx of ESRD and required EMS transport to her first outpatient HD session on 02/2.  However, due to hypotension she only had 1.7L of fluid removed during initial outpatient HD session.  Following her HD treatment she was transported to the ER for further evaluation of her generalized weakness and difficulty with ambulation.   Acute on chronic hypercapnia hypoxic respiratory failure multifactorial -- currently stable on 5 L nasal cannula -- wean as tolerated -- PRN bronchodilators  end-stage renal disease on hemodialysis/pulmonary edema -- continue dialysis with ultrafiltration. -- Patient tolerating it well. 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 Coli -- pt's white count increased to 22k (13K) -- no other source of infection noted. -- Will empirically treat with Fosphomycin  TOC for discharge planning to CIR versus rehab.    VITALS:  Blood pressure (!) 119/54,  pulse 88, temperature 98.3 F (36.8 C), resp. rate 20, height 5\' 4"  (1.626 m), weight (!) 156.8 kg, last menstrual period 07/17/2012, SpO2 93 %.  PHYSICAL EXAMINATION:   GENERAL:  56 y.o.-year-old patient lying in the bed with no acute distress. Morbidly obese HEENT: Head atraumatic, normocephalic. Oropharynx and nasopharynx clear.  LUNGS: decreased breath sounds bilaterally, no wheezing, rales, rhonchi.  CARDIOVASCULAR: S1, S2 normal. No murmurs, rubs, or gallops.  ABDOMEN: Soft, nontender, nondistended. Bowel sounds present.  EXTREMITIES: ++ bilateral  edema b/l.    NEUROLOGIC: nonfocal SKIN:  Pressure Injury 03/20/21 Sacrum Stage 2 -  Partial thickness loss of dermis presenting as a shallow open injury with a red, pink wound bed without slough. area of redenss and broken skin, previous pressure injury, healing (Active)  03/20/21 1920  Location: Sacrum  Location Orientation:   Staging: Stage 2 -  Partial thickness loss of dermis presenting as a shallow open injury with a red, pink wound bed without slough.  Wound Description (Comments): area of redenss and broken skin, previous pressure injury, healing  Present on Admission: Yes       LABORATORY PANEL:  CBC Recent Labs  Lab 03/24/21 1200  WBC 22.3*  HGB 9.1*  HCT 31.8*  PLT 310     Chemistries  Recent Labs  Lab 03/20/21 0939 03/20/21 1204 03/23/21 0524 03/24/21 0316  NA 134*   < > 134*  --   K 5.3*   < > 3.7  --   CL 97*   < > 99  --   CO2 30   < > 27  --  GLUCOSE 132*   < > 107*  --   BUN 36*   < > 36*  --   CREATININE 4.52*   < > 3.35*  --   CALCIUM 8.7*   < > 8.7*  --   MG  --    < > 1.9 2.3  AST 20  --   --   --   ALT 22  --   --   --   ALKPHOS 92  --   --   --   BILITOT 0.4  --   --   --    < > = values in this interval not displayed.    Cardiac Enzymes No results for input(s): TROPONINI in the last 168 hours. RADIOLOGY:  No results found.    Procedures: Family communication : discussed with  patient's cousin Manuela Schwartz on the phone Consults : nephrology, pulmonary CODE STATUS: full DVT Prophylaxis : eliquis Level of care: Med-Surg Status is: Inpatient Remains inpatient appropriate because: continue inpatient dialysis with ultrafiltration and TOC for discharge planning to [place] THIS PATIENT: 30 minutes.  >50% time spent on counselling and coordination of care  Note: This dictation was prepared with Dragon dictation along with smaller phrase technology. Any transcriptional errors that result from this process are unintentional.  Fritzi Mandes M.D    [facility]   CC: Primary care physician; Lenard Simmer, MD

## 2021-03-24 NOTE — Progress Notes (Signed)
Patient completes 3-hour treatment without incident, vital signs stable throughout treatment, Targeted UF met with 2.5-liter fluid removal. CVC functioned well, no signs of infection, dressing changed. Patient offers up no concern, Epogen given per order, CBC drawn. Patient returned to assigned room, report given to primary nurse.

## 2021-03-24 NOTE — Progress Notes (Signed)
Mobility Specialist - Progress Note      03/24/21 1400  Mobility  Range of Motion/Exercises All extremities  Level of Assistance Minimal assist, patient does 75% or more  Assistive Device None  Distance Ambulated (ft) 0 ft  Activity Response Tolerated well  $Mobility charge 1 Mobility   Pt supine upon arrival using 5L [place]. OOB activity defered d/t lethargy but Pt agreeable to session.  Pt unable to keep eyes open throughout but responsive to questions and participation. Pt completed AAROM LE Therex with MinA. Pt complete UE exercise with independence. Post mobility 98% O2 and 86 HR. Pt left with bed alarm set and needs in reach.  Kathee Delton Mobility Specialist 03/24/21, 2:41 PM

## 2021-03-24 NOTE — Progress Notes (Signed)
[facility]  PROGRESS NOTE   Subjective:   Patient seen and evaluated during dialysis   HEMODIALYSIS FLOWSHEET:  Blood Flow Rate (mL/min): 400 mL/min Arterial Pressure (mmHg): -180 mmHg Venous Pressure (mmHg): 170 mmHg Transmembrane Pressure (mmHg): 30 mmHg Ultrafiltration Rate (mL/min): 1330 mL/min Dialysate Flow Rate (mL/min): 500 ml/min Conductivity: Machine : 13.7 Conductivity: Machine : 13.7 Dialysis Fluid Bolus: Normal Saline Bolus Amount (mL): 250 mL  No complaints at this time   Objective:  Vital signs in last 24 hours:  Temp:  [97.7 F (36.5 C)-98.4 F (36.9 C)] 98.4 F (36.9 C) (02/09 0900) Pulse Rate:  [56-93] 79 (02/09 1146) Resp:  [16-42] 38 (02/09 1146) BP: (118-159)/(44-77) 159/46 (02/09 1146) SpO2:  [92 %-98 %] 97 % (02/09 1146) Weight:  [159.9 kg-161.7 kg] 159.9 kg (02/09 0900)  Weight change:  Filed Weights   03/24/21 0500 03/24/21 0900  Weight: (!) 161.7 kg (!) 159.9 kg    Intake/Output: I/O last 3 completed shifts: In: 360 [P.O.:360] Out: -    Intake/Output this shift:  No intake/output data recorded.  Physical Exam: General:  No acute distress, laying in bed  Head:  Normocephalic, atraumatic. Moist oral mucosal membranes  Eyes:  Anicteric  Lungs:   Clear to auscultation, diminished at bases, 5L [place]  Heart:  irregular  Abdomen:   Soft, nontender, bowel sounds present, obese  Extremities:  + peripheral edema.  Neurologic:  nonfocal  Skin:  No lesions  Access:  RIJ permcath    Basic Metabolic Panel: Recent Labs  Lab 03/20/21 0939 03/20/21 1204 03/20/21 1429 03/21/21 0547 03/22/21 0417 03/23/21 0524 03/24/21 0316  NA 134*  --  133* 133* 133* 134*  --   K 5.3*  --  4.8 4.2 4.2 3.7  --   CL 97*  --  97* 97* 98 99  --   CO2 30  --  27 28 29 27   --   GLUCOSE 132*  --  105* 100* 96 107*  --   BUN 36*  --  40* 32* 36* 36*  --   CREATININE 4.52* 4.54* 4.57* 3.46* 3.36* 3.35*  --   CALCIUM 8.7*  --  8.8* 8.6* 8.6*  8.7*  --   MG  --   --  2.0 1.8 2.0 1.9 2.3  PHOS  --   --  6.4* 4.5 4.7* 3.9 5.1*     CBC: Recent Labs  Lab 03/20/21 0939 03/20/21 1204 03/20/21 1429 03/21/21 0547 03/22/21 0417  WBC 17.4* 14.6* 14.4* 13.7* 13.2*  NEUTROABS 13.5*  --   --   --  10.2*  HGB 8.0* 7.6* 7.8* 7.4* 7.5*  HCT 29.3* 27.6* 27.9* 26.0* 26.2*  MCV 101.4* 101.8* 100.0 95.9 97.4  PLT 266 234 244 235 229      Urinalysis: No results for input(s): COLORURINE, LABSPEC, PHURINE, GLUCOSEU, HGBUR, BILIRUBINUR, KETONESUR, PROTEINUR, UROBILINOGEN, NITRITE, LEUKOCYTESUR in the last 72 hours.  Invalid input(s): APPERANCEUR     Imaging: DG Chest Port 1 View  Result Date: 03/22/2021 CLINICAL DATA:  Atelectasis. EXAM: PORTABLE CHEST 1 VIEW COMPARISON:  March 21, 2021. FINDINGS: Stable cardiomegaly with central pulmonary vascular congestion. Right internal jugular dialysis catheter is unchanged in position. Mild bilateral pulmonary edema may be present. Right upper lobe atelectasis is noted. Bony thorax is unremarkable. IMPRESSION: Stable cardiomegaly with central pulmonary vascular congestion and mild bilateral pulmonary edema. Right upper lobe atelectasis. Electronically Signed   By: Marijo Conception M.D.   On: 03/22/2021 14:17  Medications:    sodium chloride Stopped (03/20/21 1758)    amLODipine  5 mg Oral Daily   apixaban  5 mg Oral BID   atorvastatin  20 mg Oral QHS   bisacodyl  10 mg Rectal Daily   calcitRIOL  0.25 mcg Oral Daily   calcium carbonate  1 tablet Oral Daily   chlorhexidine  15 mL Mouth Rinse BID   Chlorhexidine Gluconate Cloth  6 each Topical q morning   cloNIDine  0.1 mg Oral Daily   epoetin alfa       epoetin (EPOGEN/PROCRIT) injection  10,000 Units Intravenous Q T,Th,Sa-HD   famotidine  10 mg Oral Daily   feeding supplement (NEPRO CARB STEADY)  237 mL Oral BID BM   folic acid  7,322 mcg Oral Daily   gabapentin  300 mg Oral QHS   Gerhardt's butt cream  3 application Topical TID    heparin sodium (porcine)       levothyroxine  112 mcg Oral QAC breakfast   mouth rinse  15 mL Mouth Rinse q12n4p   metoprolol tartrate  100 mg Oral BID   multivitamin  1 tablet Oral QHS   sevelamer carbonate  800 mg Oral TID WC   sodium chloride flush  3 mL Intravenous Q12H   Vitamin D (Ergocalciferol)  50,000 Units Oral Weekly    Assessment/ Plan:     Principal Problem:   Acute on chronic respiratory failure with hypoxia and hypercapnia (HCC) Active Problems:   History of breast cancer   Obstructive sleep apnea   Morbid obesity with BMI of 60.0-69.9, adult (HCC)   Diabetes mellitus type 2 in obese (HCC)   Hypothyroidism   AF (paroxysmal atrial fibrillation) (HCC)   Physical deconditioning   Chronic respiratory failure with hypoxia (HCC)   ESRD (end stage renal disease) (HCC)   Debility   Acute respiratory failure with hypoxia (HCC)   Pressure injury of skin   Acute respiratory failure with hypoxia and hypercapnia ([place])   Ambulatory dysfunction  Nicole Schmidt is a 56 y.o. female with morbid obesity, diabetes mellitus type II, peripheral vascular disease, hypertension, coronary artery disease, congestive heart failure, atrial fibrillation, acute kidney injury on chronic kidney disease requiring hemodialysis.  [facility] TTS [address] RIJ permcath 166.5kg  #1: End Stage Renal Disease: Admitted with some volume overload, now resolved with daily dialysis.  Patient receiving dialysis with increased UF of 2.5 L as tolerated.  Next treatment scheduled for Saturday.  Discharge plan currently involves rehab, will continue to monitor and dialysis coordinator available for any outpatient needs  #2: Morbid obesity/obstructive sleep apnea: Pickwickian syndrome. Appreciate pulmonology input. Continue BIPAP at night.    #3: Hypertension: 159/46 current regimen of amlodipine, metoprolol and clonidine. Continue current treatment.  #4: Anemia of chronic kidney disease. Hemoglobin 7.5.  Most likely contributing to patient's overall condition. EPO with TTS schedule Will order CBC for updated labs during dialysis  #5. Secondary Hyperparathyroidism: sevelamer with meals.     LOS: [place], MD [facility] 2/9/202311:54 AM

## 2021-03-24 NOTE — Progress Notes (Signed)
PT Cancellation Note  Patient Details Name: Nicole Schmidt MRN: [id number] DOB: 27-May-1965   Cancelled Treatment:    Reason Eval/Treat Not Completed: Fatigue/lethargy limiting ability to participate. Patient had dialysis earlier today. She is very fatigued following dialysis and is requesting PT follow up at another time when she feels better. PT will continue with attempts as appropriate.   Minna Merritts, PT, MPT  Percell Locus 03/24/2021, 1:36 PM

## 2021-03-25 ENCOUNTER — Inpatient Hospital Stay: Payer: BC Managed Care – PPO

## 2021-03-25 DIAGNOSIS — Z6841 Body Mass Index (BMI) 40.0 and over, adult: Secondary | ICD-10-CM

## 2021-03-25 DIAGNOSIS — G4733 Obstructive sleep apnea (adult) (pediatric): Secondary | ICD-10-CM | POA: Diagnosis not present

## 2021-03-25 DIAGNOSIS — N186 End stage renal disease: Secondary | ICD-10-CM | POA: Diagnosis not present

## 2021-03-25 DIAGNOSIS — E038 Other specified hypothyroidism: Secondary | ICD-10-CM

## 2021-03-25 DIAGNOSIS — B9629 Other Escherichia coli [E. coli] as the cause of diseases classified elsewhere: Secondary | ICD-10-CM

## 2021-03-25 DIAGNOSIS — N390 Urinary tract infection, site not specified: Secondary | ICD-10-CM

## 2021-03-25 DIAGNOSIS — J9621 Acute and chronic respiratory failure with hypoxia: Secondary | ICD-10-CM | POA: Diagnosis not present

## 2021-03-25 DIAGNOSIS — Z1612 Extended spectrum beta lactamase (ESBL) resistance: Secondary | ICD-10-CM

## 2021-03-25 LAB — CBC
HCT: 31 % — ABNORMAL LOW (ref 36.0–46.0)
Hemoglobin: 8.7 g/dL — ABNORMAL LOW (ref 12.0–15.0)
MCH: 27.8 pg (ref 26.0–34.0)
MCHC: 28.1 g/dL — ABNORMAL LOW (ref 30.0–36.0)
MCV: 99 fL (ref 80.0–100.0)
Platelets: 277 10*3/uL (ref 150–400)
RBC: 3.13 MIL/uL — ABNORMAL LOW (ref 3.87–5.11)
RDW: 16.5 % — ABNORMAL HIGH (ref 11.5–15.5)
WBC: 18.8 10*3/uL — ABNORMAL HIGH (ref 4.0–10.5)
nRBC: 1.8 % — ABNORMAL HIGH (ref 0.0–0.2)

## 2021-03-25 LAB — GLUCOSE, CAPILLARY
Glucose-Capillary: 130 mg/dL — ABNORMAL HIGH (ref 70–99)
Glucose-Capillary: 99 mg/dL (ref 70–99)
Glucose-Capillary: 90 mg/dL (ref 70–99)

## 2021-03-25 LAB — PHOSPHORUS: Phosphorus: 4.1 mg/dL (ref 2.5–4.6)

## 2021-03-25 LAB — CULTURE, BLOOD (ROUTINE X 2)
Culture: NO GROWTH
Culture: NO GROWTH

## 2021-03-25 LAB — BLOOD GAS, VENOUS

## 2021-03-25 LAB — MAGNESIUM: Magnesium: 2.2 mg/dL (ref 1.7–2.4)

## 2021-03-25 MED ORDER — METOPROLOL TARTRATE 25 MG PO TABS: 25.0000 mg | ORAL_TABLET | Freq: Two times a day (BID) | ORAL | Status: DC

## 2021-03-25 MED ORDER — FOSFOMYCIN TROMETHAMINE 3 G PO PACK: 3.0000 g | PACK | Freq: Once | ORAL | Status: DC

## 2021-03-25 MED ORDER — METOPROLOL SUCCINATE ER 25 MG PO TB24
25.0000 mg | ORAL_TABLET | Freq: Every day | ORAL | Status: DC
  Administered 2021-03-25 – 2021-03-28 (×4): 25 mg via ORAL
  Filled 2021-03-25 (×4): qty 1

## 2021-03-25 MED ORDER — SODIUM CHLORIDE 0.9 % IV SOLN
500.0000 mg | Freq: Every day | INTRAVENOUS | Status: DC
  Administered 2021-03-25 – 2021-03-27 (×3): 500 mg via INTRAVENOUS
  Filled 2021-03-25 (×3): qty 10
  Filled 2021-03-25 (×2): qty 500

## 2021-03-25 NOTE — Progress Notes (Signed)
Inpatient Rehabilitation Admissions Coordinator   I have insurance approval for admit to [place]. I have alerted acute team and TOC. I await Dr Posey Pronto to give medical clearance to plan discharge to CIR today.  Danne Baxter, RN, MSN Rehab Admissions Coordinator [phone] 03/25/2021 8:33 AM

## 2021-03-25 NOTE — Progress Notes (Signed)
Physical Therapy Treatment Patient Details Name: Nicole Schmidt MRN: [id number] DOB: 07-28-1965 Today's Date: 03/25/2021   History of Present Illness Nicole Schmidt is a 64yoF who [facility] on [facility]17[facility]21 after acute increase in edema limiting ability to walk. Pt was DC to home previous day from inpatient rehab in [place]. PMH: ESRD on HD TTS, DMN, HTN, HLD, AF, PE, anemia, dCHF. Pt was at CIR from 1/21-1/31.    PT Comments    Pt seen for PT tx with pt received asleep, but easily awakened, & nasal cannula only in 1 nostril with PT fixing it. Pt on 5L/min with SpO2 >/= 90% throughout. Pt requires extra time but no physical assist to transfer to sitting EOB with use of hospital bed features. Pt appears visibly fatigued & with BLE weakness so standing attempts deferred as it's unsafe without +2 assist. Pt tolerates sitting EOB ~10 minutes without LOB. Pt requires max assist to complete scooting to L along EOB with rest breaks in between & cuing for anterior weight shifting. Pt ultimately requires +2 assist for sit>supine. Encouraged pt to participate in PT as much as possible moving forward to prevent further functional decline & pt agreeable. Pt eager to improve but is limited by significant fatigue.    Recommendations for follow up therapy are one component of a multi-disciplinary discharge planning process, led by the attending physician.  Recommendations may be updated based on patient status, additional functional criteria and insurance authorization.  Follow Up Recommendations  Acute inpatient rehab (3hours/day)     Assistance Recommended at Discharge Frequent or constant Supervision/Assistance  Patient can return home with the following Two people to help with walking and/or transfers;Assistance with cooking/housework;Help with stairs or ramp for entrance;Assist for transportation;Two people to help with bathing/dressing/bathroom   Equipment Recommendations  None recommended by PT     Recommendations for Other Services       Precautions / Restrictions Precautions Precautions: Fall Precaution Comments: O2 Restrictions Weight Bearing Restrictions: No RLE Weight Bearing: Weight bearing as tolerated LLE Weight Bearing: Weight bearing as tolerated     Mobility  Bed Mobility Overal bed mobility: Needs Assistance Bed Mobility: Supine to Sit, Sit to Supine     Supine to sit: Supervision, HOB elevated (extra time, 2 attempts to pull trunk to sitting EOB) Sit to supine: Max assist, +2 for physical assistance, +2 for safety/equipment, HOB elevated (assistance to lower trunk to bed, elevate BLE onto bed)        Transfers Overall transfer level:  (not safe to attempt without +2 2/2 weakness)                      Ambulation/Gait                   Stairs             Wheelchair Mobility    Modified Rankin (Stroke Patients Only)       Balance Overall balance assessment: Needs assistance Sitting-balance support: Feet supported, Bilateral upper extremity supported Sitting balance-Leahy Scale: Fair                                      Cognition Arousal/Alertness: Awake/alert Behavior During Therapy: WFL for tasks assessed/performed Overall Cognitive Status: Within Functional Limits for tasks assessed  Exercises      General Comments        Pertinent Vitals/Pain Pain Assessment Pain Assessment: No/denies pain    Home Living                          Prior Function            PT Goals (current goals can now be found in the care plan section) Acute Rehab PT Goals Patient Stated Goal: to get better, be more independent PT Goal Formulation: With patient Time For Goal Achievement: 04/04/21 Potential to Achieve Goals: Fair Progress towards PT goals: Progressing toward goals    Frequency    Min 2X/week      PT Plan Current plan  remains appropriate    Co-evaluation              AM-PAC PT "6 Clicks" Mobility   Outcome Measure  Help needed turning from your back to your side while in a flat bed without using bedrails?: A Lot Help needed moving from lying on your back to sitting on the side of a flat bed without using bedrails?: A Lot Help needed moving to and from a bed to a chair (including a wheelchair)?: Total Help needed standing up from a chair using your arms (e.g., wheelchair or bedside chair)?: Total Help needed to walk in hospital room?: Total Help needed climbing 3-5 steps with a railing? : Total 6 Click Score: 8    End of Session Equipment Utilized During Treatment: Oxygen Activity Tolerance: Patient limited by fatigue;Patient tolerated treatment well Patient left: in bed;with call bell/phone within reach;with nursing/sitter in room Nurse Communication: Mobility status PT Visit Diagnosis: Unsteadiness on feet (R26.81);Muscle weakness (generalized) (M62.81);Difficulty in walking, not elsewhere classified (R26.2)     Time: 6546-5035 PT Time Calculation (min) (ACUTE ONLY): 31 min  Charges:  $Therapeutic Activity: 23-37 mins                     Lavone Nian, PT, DPT 03/25/21, 2:49 PM   [name] 03/25/2021, 2:47 PM

## 2021-03-25 NOTE — Progress Notes (Signed)
°  Inpatient Rehabilitation Admissions Coordinator   Notified by Dr Posey Pronto that discharge on hold. We will follow up on Monday for possible Cir admit.  Danne Baxter, RN, MSN Rehab Admissions Coordinator [phone] 03/25/2021 11:55 AM

## 2021-03-25 NOTE — Progress Notes (Signed)
[name] NAME: Nicole Schmidt    MR#:  [id number]  DATE OF BIRTH:  [birth year]  SUBJECTIVE:  patient currently is on 4 L nasal cannula oxygen patient very sleepy although awakens on verbal command no fever. Sugar this morning 130.Marland Kitchen Not much communicative as before. No family at bedside  Assessment and Plan:  56 yo female recently discharged home from [place] on 02/1.  She presented to [facility] ER on 02/2 due to an inability to ambulate up and down her steps due to worsening generalized swelling.  She does have a hx of ESRD and required EMS transport to her first outpatient HD session on 02/2.  However, due to hypotension she only had 1.7L of fluid removed during initial outpatient HD session.  Following her HD treatment she was transported to the ER for further evaluation of her generalized weakness and difficulty with ambulation.   Acute on chronic hypercapnia hypoxic respiratory failure multifactorial -- currently stable on 5 L nasal cannula -- wean as tolerated -- PRN bronchodilators  end-stage renal disease on hemodialysis/pulmonary edema -- continue dialysis with ultrafiltration. -- Patient tolerating it well. -- Nephrology consultation appreciated  Leukocytosis H/o left ureteral stone s/p stent placement on 02/14/2021 by Dr Diamantina Providence --urine culture positive for ESBL Proteus and E. Coli -- pt's white count increased to 22k (13K)--18K -- no other source of infection noted. -- Patient received dose of Fosphomycin 2/9 -- discussed with infectious disease Dr. Steva Ready to see patient. White count is still elevated. Will change to IV meropenem -- repeat ultrasound to ensure left hydronephrosis has clear  acute on chronic diastolic congestive heart failure Hypertension Hyperlipidemia history of PE -- continue BP meds and statins -- continue eliquis  anemia of chronic disease -- hemoglobin  stable  Hypothyroidism -- continue Synthroid  Type II diabetes with end-stage renal disease -- sugars have been well-controlled -- continue sliding scale  morbid obesity following our with suspected sleep apnea -- complicates recovery and prognosis   TOC for discharge planning to CIR versus rehab.   VITALS:  Blood pressure (!) 131/58, pulse 83, temperature 97.6 F (36.4 C), temperature source Oral, resp. rate 20, height 5\' 4"  (1.626 m), weight (!) 160.2 kg, last menstrual period 07/17/2012, SpO2 93 %.  PHYSICAL EXAMINATION:   GENERAL:  56 y.o.-year-old patient lying in the bed with no acute distress. Morbidly obese HEENT: Head atraumatic, normocephalic. Oropharynx and nasopharynx clear.  LUNGS: decreased breath sounds bilaterally, no wheezing, rales, rhonchi.  CARDIOVASCULAR: S1, S2 normal. No murmurs, rubs, or gallops.  ABDOMEN: Soft, nontender, nondistended. Bowel sounds present.  EXTREMITIES: ++ bilateral  edema b/l.    NEUROLOGIC: nonfocal SKIN:  Pressure Injury 03/20/21 Sacrum Stage 2 -  Partial thickness loss of dermis presenting as a shallow open injury with a red, pink wound bed without slough. area of redenss and broken skin, previous pressure injury, healing (Active)  03/20/21 1920  Location: Sacrum  Location Orientation:   Staging: Stage 2 -  Partial thickness loss of dermis presenting as a shallow open injury with a red, pink wound bed without slough.  Wound Description (Comments): area of redenss and broken skin, previous pressure injury, healing  Present on Admission: Yes       LABORATORY PANEL:  CBC Recent Labs  Lab 03/25/21 1026  WBC 18.8*  HGB 8.7*  HCT 31.0*  PLT 277     Chemistries  Recent Labs  Lab 03/20/21 [phone]  03/20/21 1204 03/23/21 0524 03/24/21 0316 03/25/21 0350  NA 134*   < > 134*  --   --   K 5.3*   < > 3.7  --   --   CL 97*   < > 99  --   --   CO2 30   < > 27  --   --   GLUCOSE 132*   < > 107*  --   --   BUN 36*   < > 36*   --   --   CREATININE 4.52*   < > 3.35*  --   --   CALCIUM 8.7*   < > 8.7*  --   --   MG  --    < > 1.9   < > 2.2  AST 20  --   --   --   --   ALT 22  --   --   --   --   ALKPHOS 92  --   --   --   --   BILITOT 0.4  --   --   --   --    < > = values in this interval not displayed.    Cardiac Enzymes No results for input(s): TROPONINI in the last 168 hours. RADIOLOGY:  No results found.    Procedures: Family communication : discussed with patient's cousin Manuela Schwartz on the phone Consults : nephrology, pulmonary CODE STATUS: full DVT Prophylaxis : eliquis Level of care: Med-Surg Status is: Inpatient Remains inpatient appropriate because:  Patient appears more lethargic with elevated white count will hold off discharge for now. ID to see patient. IV antibiotics started. Continue to monitor over the weekend to see if she improves  discussed with patient's cousin/family Manuela Schwartz. She understands patient overall care is a poor prognosis          TOTAL TIME TAKING CARE OF THIS PATIENT: 30 minutes.  >50% time spent on counselling and coordination of care  Note: This dictation was prepared with Dragon dictation along with smaller phrase technology. Any transcriptional errors that result from this process are unintentional.  Fritzi Mandes M.D    [facility]   CC: Primary care physician; Lenard Simmer, MD

## 2021-03-25 NOTE — Consult Note (Signed)
NAME: Nicole Schmidt  DOB: Jul 24, 1965  MRN: [id number]  Date/Time: 03/25/2021 1:48 PM  REQUESTING PROVIDER: Dr.PAtel Subjective:  REASON FOR CONSULT: leucocytosis ?PT a poor historian, chart reviewed Nicole Schmidt is a 56 y.o. female with a history of diabetes mellitus, hypertension, hyperlipidemia, hypothyroidism, gout, chronic diastolic CHF, chronic hypoxic respiratory failure on oxygen, atrial fibrillation and PE on Eliquis with multiple hospitalizations in the past 2 months presents to the ED from HD because she was unable to get up the stairs because of swelling of her legs and weakness and for evaluation.  She was recently discharged from rehab on 03/16/2021. She was in the ED for 3 days as plans were being made for safe discharge with TOC  getting dialysis when she became somnolent and unable to follow commands on 03/20/21. VBG revealed Pco2 89. Placed on bipap and admitted to ICU . She was being treated for acute on chronic hypercapna/hypoxic resp failure secondary to pulmonary edema/ OSA/OHS She also was placed on ceftriaxone for possible UTI . Her resp improved and she was transferred out of ICU. I am asked to see her as WBC shot up to 22 yesterday and urine had ESBL ecoli and proteus  Medical history recent hospitalization between 02/23/2021 until 03/04/2021 for CHF exacerbation and was being transferred to inpatient rehab following that and was discharged from rehab on 03/16/2021. Prior to that patient was hospitalized from 02/01/2021 until 02/22/2021 for CHF exacerbation and AKI and at that time had left hydronephrosis secondary to a stent stone and a stent was placed.  She also had permacath placed on 02/25/2021 and hemodialysis was initiated for end-stage renal disease and fluid overload.  She was also transfused blood.   Past Medical History:  Diagnosis Date   (HFpEF) heart failure with preserved ejection fraction ([place])    a. 10/2018 Echo: EF 60-65%, diast dysfxn, RVSP 50.7 mmHg, mildly dil  LA.   Acquired hypothyroidism 02/09/2020   Anemia    Arthritis    Breast cancer ([place]) 2014   right breast cancer   Breast cancer of upper-inner quadrant of right female breast ([place])    Right breast invasive CA and DCIS , 7 mm T1,N0,M0. Er/PR pos, her 2 negative.  Margins 1 mm.   CHF (congestive heart failure) (HCC)    CKD (chronic kidney disease), stage III (HCC)    Diabetes mellitus type 2 in obese ([place]) 02/09/2020   Diabetes mellitus without complication ([place])    Dyslipidemia 02/09/2020   Essential hypertension    Gout    Hypothyroidism    Menopause    age 77   Morbid obesity ([place])    Persistent atrial fibrillation ([place])    a. Dx 10/2018 in setting of PNA. CHA2DS2VASc = 4-->Eliquis; b. 11/2018 s/p successful DCCV.   Personal history of radiation therapy    Pulmonary embolism ([place]) 10/2014   a. Chronic eliquis.   Thyroid goiter     Past Surgical History:  Procedure Laterality Date   BREAST BIOPSY Right 2014   breast ca   BREAST EXCISIONAL BIOPSY Right 07/19/2012   breast ca   BREAST SURGERY Right 2014   with sentinel node bx subareaolar duct excision   CARDIOVERSION N/A 11/28/2018   Procedure: CARDIOVERSION;  Surgeon: Minna Merritts, MD;  Location: [facility];  Service: Cardiovascular;  Laterality: N/A;   COLONOSCOPY WITH PROPOFOL N/A 01/30/2017   Procedure: COLONOSCOPY WITH PROPOFOL;  Surgeon: Christene Lye, MD;  Location: [facility] ENDOS[facility];  Service: Endoscopy;  Laterality: N/A;  CYSTOSCOPY W/ URETERAL STENT PLACEMENT Left 02/14/2021   Procedure: CYSTOSCOPY WITH RETROGRADE PYELOGRAM/URETERAL STENT PLACEMENT;  Surgeon: Billey Co, MD;  Location: [facility];  Service: Urology;  Laterality: Left;   DIALYSIS/PERMA CATHETER INSERTION N/A 02/24/2021   Procedure: DIALYSIS/PERMA CATHETER INSERTION;  Surgeon: Algernon Huxley, MD;  Location: [facility];  Service: Cardiovascular;  Laterality: N/A;    Social History   Socioeconomic History   Marital status: Single     Spouse name: Not on file   Number of children: Not on file   Years of education: Not on file   Highest education level: Not on file  Occupational History   Occupation: retired  Tobacco Use   Smoking status: Never   Smokeless tobacco: Never  Vaping Use   Vaping Use: Never used  Substance and Sexual Activity   Alcohol use: No    Alcohol/week: 0.0 standard drinks   Drug use: No   Sexual activity: Not on file  Other Topics Concern   Not on file  Social History Narrative   Lives in [place] by herself.  Does not routinely exercise.  Works full-time - on her feet all day at work.   Social Determinants of Health   Financial Resource Strain: Not on file  Food Insecurity: Not on file  Transportation Needs: Not on file  Physical Activity: Not on file  Stress: Not on file  Social Connections: Not on file  Intimate Partner Violence: Not on file    Family History  Problem Relation Age of Onset   Diabetes Father    Hypertension Father    Parkinson's disease Father    Hypertension Mother    Rheum arthritis Mother    Atrial fibrillation Mother    Heart failure Mother    Heart attack Mother        died in her mid-70's.   Colon cancer Cousin 63   Breast cancer Neg Hx    No Known Allergies I? Current Facility-Administered Medications  Medication Dose Route Frequency Provider Last Rate Last Admin   0.9 %  sodium chloride infusion  250 mL Intravenous PRN Flora Lipps, MD   Stopped at 03/20/21 1758   acetaminophen (TYLENOL) tablet 650 mg  650 mg Oral Q6H PRN Rada Hay, MD   650 mg at 03/20/21 1946   acetaminophen (TYLENOL) tablet 650 mg  650 mg Oral Q4H PRN Flora Lipps, MD       albuterol (PROVENTIL) (2.5 MG/3ML) 0.083% nebulizer solution 3 mL  3 mL Inhalation Q6H PRN Rada Hay, MD   3 mL at 03/24/21 2037   amLODipine (NORVASC) tablet 5 mg  5 mg Oral Daily Rada Hay, MD   5 mg at 03/25/21 0858   apixaban (ELIQUIS) tablet 5 mg  5 mg Oral BID Benita Gutter, RPH   5 mg at 03/25/21 6378   atorvastatin (LIPITOR) tablet 20 mg  20 mg Oral QHS Rada Hay, MD   20 mg at 03/24/21 2024   bisacodyl (DULCOLAX) suppository 10 mg  10 mg Rectal Daily Fritzi Mandes, MD   10 mg at 03/23/21 1645   calcitRIOL (ROCALTROL) capsule 0.25 mcg  0.25 mcg Oral Daily Rada Hay, MD   0.25 mcg at 03/25/21 5885   calcium carbonate (OS-CAL - dosed in mg of elemental calcium) tablet 500 mg of elemental calcium  1 tablet Oral Daily Rada Hay, MD   500 mg of elemental calcium at 03/25/21 [phone]  chlorhexidine (PERIDEX) 0.12 % solution 15 mL  15 mL Mouth Rinse BID Flora Lipps, MD   15 mL at 03/25/21 0851   Chlorhexidine Gluconate Cloth 2 % PADS 6 each  6 each Topical q morning Richarda Osmond, MD   6 each at 03/25/21 0854   epoetin alfa (EPOGEN) injection 10,000 Units  10,000 Units Intravenous Q T,Th,Sa-HD Kolluru, Sarath, MD   10,000 Units at 03/24/21 1108   famotidine (PEPCID) tablet 10 mg  10 mg Oral Daily Benita Gutter, RPH   10 mg at 03/25/21 0853   feeding supplement (NEPRO CARB STEADY) liquid 237 mL  237 mL Oral BID BM Ottie Glazier, MD   237 mL at 16/10/96 0454   folic acid (FOLVITE) tablet 1 mg  1,000 mcg Oral Daily Rada Hay, MD   1 mg at 03/25/21 0981   gabapentin (NEURONTIN) capsule 300 mg  300 mg Oral QHS Rada Hay, MD   300 mg at 03/24/21 2024   Gerhardt's butt cream 3 application  3 application Topical TID Rada Hay, MD   3 application at 19/14/78 2028   levothyroxine (SYNTHROID) tablet 112 mcg  112 mcg Oral QAC breakfast Rada Hay, MD   112 mcg at 03/25/21 2956   MEDLINE mouth rinse  15 mL Mouth Rinse q12n4p Flora Lipps, MD   15 mL at 03/25/21 1211   melatonin tablet 10 mg  10 mg Oral QHS PRN Rada Hay, MD   10 mg at 03/24/21 2247   meropenem (MERREM) 500 mg in sodium chloride 0.9 % 100 mL IVPB  500 mg Intravenous Daily Fritzi Mandes, MD       metoprolol succinate (TOPROL-XL) 24 hr  tablet 25 mg  25 mg Oral Daily Fritzi Mandes, MD   25 mg at 03/25/21 2130   multivitamin (RENA-VIT) tablet 1 tablet  1 tablet Oral QHS Ottie Glazier, MD   1 tablet at 03/24/21 2024   ondansetron (ZOFRAN) injection 4 mg  4 mg Intravenous Q6H PRN Flora Lipps, MD       polyethylene glycol (MIRALAX / GLYCOLAX) packet 17 g  17 g Oral Daily PRN Flora Lipps, MD   17 g at 03/23/21 8657   sevelamer carbonate (RENVELA) tablet 800 mg  800 mg Oral TID WC Rada Hay, MD   800 mg at 03/25/21 1210   sodium chloride flush (NS) 0.9 % injection 3 mL  3 mL Intravenous Q12H Flora Lipps, MD   3 mL at 03/25/21 0857   sodium chloride flush (NS) 0.9 % injection 3 mL  3 mL Intravenous PRN Flora Lipps, MD       traMADol Veatrice Bourbon) tablet 50 mg  50 mg Oral Q6H PRN Rada Hay, MD   50 mg at 03/24/21 1757   Vitamin D (Ergocalciferol) (DRISDOL) capsule 50,000 Units  50,000 Units Oral Weekly Rada Hay, MD   50,000 Units at 03/23/21 0813     Abtx:  Anti-infectives (From admission, onward)    Start     Dose/Rate Route Frequency Ordered Stop   03/27/21 1000  fosfomycin (MONUROL) packet 3 g  Status:  Discontinued        3 g Oral  Once 03/25/21 0851 03/25/21 1300   03/25/21 1400  meropenem (MERREM) 500 mg in sodium chloride 0.9 % 100 mL IVPB        500 mg 200 mL/hr over 30 Minutes Intravenous Daily 03/25/21 1156     03/24/21 1600  fosfomycin (  MONUROL) packet 3 g  Status:  Discontinued        3 g Oral every 72 hours 03/24/21 1406 03/25/21 0851   03/21/21 1200  cefTRIAXone (ROCEPHIN) 1 g in sodium chloride 0.9 % 100 mL IVPB  Status:  Discontinued        1 g 200 mL/hr over 30 Minutes Intravenous Every 24 hours 03/20/21 1331 03/21/21 1132   03/20/21 1330  vancomycin (VANCOREADY) IVPB 1500 mg/300 mL       See Hyperspace for full Linked Orders Report.   1,500 mg 150 mL/hr over 120 Minutes Intravenous  Once 03/20/21 1218 03/20/21 1607   03/20/21 1230  vancomycin (VANCOCIN) IVPB 1000 mg/200 mL premix        See Hyperspace for full Linked Orders Report.   1,000 mg 200 mL/hr over 60 Minutes Intravenous  Once 03/20/21 1218 03/20/21 1754   03/20/21 1215  vancomycin (VANCOCIN) IVPB 1000 mg/200 mL premix  Status:  Discontinued        1,000 mg 200 mL/hr over 60 Minutes Intravenous  Once 03/20/21 1209 03/20/21 1218   03/20/21 1115  cefTRIAXone (ROCEPHIN) 2 g in sodium chloride 0.9 % 100 mL IVPB        2 g 200 mL/hr over 30 Minutes Intravenous  Once 03/20/21 1101 03/20/21 1203       REVIEW OF SYSTEMS:  NA Pt says she is sleepy  Objective:  VITALS:  BP (!) 131/58    Pulse 83    Temp 97.6 F (36.4 C) (Oral)    Resp 20    Ht 5\' 4"  (1.626 m)    Wt (!) 160.2 kg    LMP 07/17/2012    SpO2 93%    BMI 60.62 kg/m  PHYSICAL EXAM:  General: somnolent- she is able to say her name , year, place, month day Obese, face flushed Head: Normocephalic, without obvious abnormality, atraumatic. Eyes: Conjunctivae clear, anicteric sclerae. Pupils are equal ENT Nares normal. No drainage or sinus tenderness. Lips, mucosa, and tongue normal. No Thrush Neck:  symmetrical, no adenopathy, thyroid: non tender no carotid bruit and no JVD. Lungs: Clear to auscultation bilaterally. No Wheezing or Rhonchi. No rales. Heart: irregular- well controlled Abdomen: Soft, non-tender,not distended. Bowel sounds normal. No masses Extremities: lymphedema legs Tunneled cath IJ Skin: No rashes or lesions. Or bruising Lymph: Cervical, supraclavicular normal. Neurologic: cannot assess in detail Pertinent Labs Lab Results CBC    Component Value Date/Time   WBC 18.8 (H) 03/25/2021 1026   RBC 3.13 (L) 03/25/2021 1026   HGB 8.7 (L) 03/25/2021 1026   HGB 9.9 (L) 02/27/2020 0931   HCT 31.0 (L) 03/25/2021 1026   HCT 32.2 (L) 02/27/2020 0931   PLT 277 03/25/2021 1026   PLT 232 02/27/2020 0931   MCV 99.0 03/25/2021 1026   MCV 89 02/27/2020 0931   MCV 90 03/09/2014 1533   MCH 27.8 03/25/2021 1026   MCHC 28.1 (L) 03/25/2021 1026    RDW 16.5 (H) 03/25/2021 1026   RDW 13.0 02/27/2020 0931   RDW 15.2 (H) 03/09/2014 1533   LYMPHSABS 1.2 03/22/2021 0417   LYMPHSABS 1.5 03/09/2014 1533   MONOABS 1.4 (H) 03/22/2021 0417   MONOABS 0.6 03/09/2014 1533   EOSABS 0.3 03/22/2021 0417   EOSABS 0.2 03/09/2014 1533   BASOSABS 0.1 03/22/2021 0417   BASOSABS 0.1 03/09/2014 1533    CMP Latest Ref Rng & Units 03/23/2021 03/22/2021 03/21/2021  Glucose 70 - 99 mg/dL 107(H) 96 100(H)  BUN 6 -  20 mg/dL 36(H) 36(H) 32(H)  Creatinine 0.44 - 1.00 mg/dL 3.35(H) 3.36(H) 3.46(H)  Sodium 135 - 145 mmol/L 134(L) 133(L) 133(L)  Potassium 3.5 - 5.1 mmol/L 3.7 4.2 4.2  Chloride 98 - 111 mmol/L 99 98 97(L)  CO2 22 - 32 mmol/L 27 29 28   Calcium 8.9 - 10.3 mg/dL 8.7(L) 8.6(L) 8.6(L)  Total Protein 6.5 - 8.1 g/dL - - -  Total Bilirubin 0.3 - 1.2 mg/dL - - -  Alkaline Phos 38 - 126 U/L - - -  AST 15 - 41 U/L - - -  ALT 0 - 44 U/L - - -      Microbiology: Recent Results (from the past 240 hour(s))  Resp Panel by RT-PCR (Flu A&B, Covid) Nasopharyngeal Swab     Status: None   Collection Time: 03/17/21  6:36 PM   Specimen: Nasopharyngeal Swab; Nasopharyngeal(NP) swabs in vial transport medium  Result Value Ref Range Status   SARS Coronavirus 2 by RT PCR NEGATIVE NEGATIVE Final    Comment: (NOTE) SARS-CoV-2 target nucleic acids are NOT DETECTED.  The SARS-CoV-2 RNA is generally detectable in upper respiratory specimens during the acute phase of infection. The lowest concentration of SARS-CoV-2 viral copies this assay can detect is 138 copies/mL. A negative result does not preclude SARS-Cov-2 infection and should not be used as the sole basis for treatment or other patient management decisions. A negative result may occur with  improper specimen collection/handling, submission of specimen other than nasopharyngeal swab, presence of viral mutation(s) within the areas targeted by this assay, and inadequate number of viral copies(<138  copies/mL). A negative result must be combined with clinical observations, patient history, and epidemiological information. The expected result is Negative.  Fact Sheet for Patients:  EntrepreneurPulse.com.au  Fact Sheet for Healthcare Providers:  IncredibleEmployment.be  This test is no t yet approved or cleared by the [place] FDA and  has been authorized for detection and/or diagnosis of SARS-CoV-2 by FDA under an Emergency Use Authorization (EUA). This EUA will remain  in effect (meaning this test can be used) for the duration of the COVID-19 declaration under Section 564(b)(1) of the Act, 21 U.S.C.section 360bbb-3(b)(1), unless the authorization is terminated  or revoked sooner.       Influenza A by PCR NEGATIVE NEGATIVE Final   Influenza B by PCR NEGATIVE NEGATIVE Final    Comment: (NOTE) The Xpert Xpress SARS-CoV-2/FLU/RSV plus assay is intended as an aid in the diagnosis of influenza from Nasopharyngeal swab specimens and should not be used as a sole basis for treatment. Nasal washings and aspirates are unacceptable for Xpert Xpress SARS-CoV-2/FLU/RSV testing.  Fact Sheet for Patients: EntrepreneurPulse.com.au  Fact Sheet for Healthcare Providers: IncredibleEmployment.be  This test is not yet approved or cleared by the [place] FDA and has been authorized for detection and/or diagnosis of SARS-CoV-2 by FDA under an Emergency Use Authorization (EUA). This EUA will remain in effect (meaning this test can be used) for the duration of the COVID-19 declaration under Section 564(b)(1) of the Act, 21 U.S.C. section 360bbb-3(b)(1), unless the authorization is terminated or revoked.  Performed at [facility], [address] [place], [place] [address]   Resp Panel by RT-PCR (Flu A&B, Covid) Nasopharyngeal Swab     Status: None   Collection Time: 03/20/21  9:39 AM   Specimen:  Nasopharyngeal Swab; Nasopharyngeal(NP) swabs in vial transport medium  Result Value Ref Range Status   SARS Coronavirus 2 by RT PCR NEGATIVE NEGATIVE Final  Comment: (NOTE) SARS-CoV-2 target nucleic acids are NOT DETECTED.  The SARS-CoV-2 RNA is generally detectable in upper respiratory specimens during the acute phase of infection. The lowest concentration of SARS-CoV-2 viral copies this assay can detect is 138 copies/mL. A negative result does not preclude SARS-Cov-2 infection and should not be used as the sole basis for treatment or other patient management decisions. A negative result may occur with  improper specimen collection/handling, submission of specimen other than nasopharyngeal swab, presence of viral mutation(s) within the areas targeted by this assay, and inadequate number of viral copies(<138 copies/mL). A negative result must be combined with clinical observations, patient history, and epidemiological information. The expected result is Negative.  Fact Sheet for Patients:  EntrepreneurPulse.com.au  Fact Sheet for Healthcare Providers:  IncredibleEmployment.be  This test is no t yet approved or cleared by the [place] FDA and  has been authorized for detection and/or diagnosis of SARS-CoV-2 by FDA under an Emergency Use Authorization (EUA). This EUA will remain  in effect (meaning this test can be used) for the duration of the COVID-19 declaration under Section 564(b)(1) of the Act, 21 U.S.C.section 360bbb-3(b)(1), unless the authorization is terminated  or revoked sooner.       Influenza A by PCR NEGATIVE NEGATIVE Final   Influenza B by PCR NEGATIVE NEGATIVE Final    Comment: (NOTE) The Xpert Xpress SARS-CoV-2/FLU/RSV plus assay is intended as an aid in the diagnosis of influenza from Nasopharyngeal swab specimens and should not be used as a sole basis for treatment. Nasal washings and aspirates are unacceptable for  Xpert Xpress SARS-CoV-2/FLU/RSV testing.  Fact Sheet for Patients: EntrepreneurPulse.com.au  Fact Sheet for Healthcare Providers: IncredibleEmployment.be  This test is not yet approved or cleared by the [place] FDA and has been authorized for detection and/or diagnosis of SARS-CoV-2 by FDA under an Emergency Use Authorization (EUA). This EUA will remain in effect (meaning this test can be used) for the duration of the COVID-19 declaration under Section 564(b)(1) of the Act, 21 U.S.C. section 360bbb-3(b)(1), unless the authorization is terminated or revoked.  Performed at [facility], [address] [place], [place] [address]   Urine Culture     Status: Abnormal   Collection Time: 03/20/21  9:40 AM   Specimen: Urine, Clean Catch  Result Value Ref Range Status   Specimen Description   Final    URINE, CLEAN CATCH Performed at [facility], [address], [place], [place] [address]    Special Requests   Final    NONE Performed at [facility], [address] [place], [place] [address]    Culture (A)  Final    >=100,000 COLONIES/mL ESCHERICHIA COLI >=100,000 COLONIES/mL PROTEUS MIRABILIS Confirmed Extended Spectrum Beta-Lactamase Producer (ESBL).  In bloodstream infections from ESBL organisms, carbapenems are preferred over piperacillin/tazobactam. They are shown to have a lower risk of mortality.    Report Status 03/22/2021 FINAL  Final   Organism ID, Bacteria ESCHERICHIA COLI (A)  Final   Organism ID, Bacteria PROTEUS MIRABILIS (A)  Final      Susceptibility   Escherichia coli - MIC*    AMPICILLIN >=32 RESISTANT Resistant     CEFAZOLIN >=64 RESISTANT Resistant     CEFEPIME 2 SENSITIVE Sensitive     CEFTRIAXONE >=64 RESISTANT Resistant     CIPROFLOXACIN >=4 RESISTANT Resistant     GENTAMICIN <=1 SENSITIVE Sensitive     IMIPENEM <=0.25 SENSITIVE Sensitive     NITROFURANTOIN <=16 SENSITIVE Sensitive  TRIMETH/SULFA >=320 RESISTANT Resistant     AMPICILLIN/SULBACTAM 8 SENSITIVE Sensitive     PIP/TAZO <=4 SENSITIVE Sensitive     * >=100,000 COLONIES/mL ESCHERICHIA COLI   Proteus mirabilis - MIC*    AMPICILLIN >=32 RESISTANT Resistant     CEFAZOLIN 8 SENSITIVE Sensitive     CEFEPIME <=0.12 SENSITIVE Sensitive     CEFTRIAXONE <=0.25 SENSITIVE Sensitive     CIPROFLOXACIN <=0.25 SENSITIVE Sensitive     GENTAMICIN <=1 SENSITIVE Sensitive     IMIPENEM 2 SENSITIVE Sensitive     NITROFURANTOIN RESISTANT Resistant     TRIMETH/SULFA >=320 RESISTANT Resistant     AMPICILLIN/SULBACTAM 16 INTERMEDIATE Intermediate     PIP/TAZO <=4 SENSITIVE Sensitive     * >=100,000 COLONIES/mL PROTEUS MIRABILIS  MRSA Next Gen by PCR, Nasal     Status: None   Collection Time: 03/20/21  2:18 PM   Specimen: Nasal Mucosa; Nasal Swab  Result Value Ref Range Status   MRSA by PCR Next Gen NOT DETECTED NOT DETECTED Final    Comment: (NOTE) The GeneXpert MRSA Assay (FDA approved for NASAL specimens only), is one component of a comprehensive MRSA colonization surveillance program. It is not intended to diagnose MRSA infection nor to guide or monitor treatment for MRSA infections. Test performance is not FDA approved in patients less than 58 years old. Performed at [facility], [address] [place], [place] [address]   Blood culture (routine x 2)     Status: None   Collection Time: 03/20/21  2:29 PM   Specimen: BLOOD  Result Value Ref Range Status   Specimen Description BLOOD LAC  Final   Special Requests BOTTLES DRAWN AEROBIC AND ANAEROBIC BCAV  Final   Culture   Final    NO GROWTH 5 DAYS Performed at [facility], [address], [place], [place] [address]    Report Status 03/25/2021 FINAL  Final  Blood culture (routine x 2)     Status: None   Collection Time: 03/20/21  2:42 PM   Specimen: BLOOD  Result Value Ref Range Status   Specimen Description BLOOD [facility]  Final   Special  Requests BOTTLES DRAWN AEROBIC AND ANAEROBIC BCLV  Final   Culture   Final    NO GROWTH 5 DAYS Performed at [facility], [address] [place], [place] [address]    Report Status 03/25/2021 FINAL  Final    IMAGING RESULTS:  I have personally reviewed the films Stable cardiomegaly with central pulmonary vascular congestion and mild bilateral pulmonary edema. Right upper lobe atelectasis. ? Impression/Recommendation ?56 y.o. female with a history of diabetes mellitus, hypertension, hyperlipidemia, hypothyroidism, gout, chronic diastolic CHF, chronic hypoxic respiratory failure on oxygen, atrial fibrillation and PE on Eliquis with multiple hospitalizations in the past 2 months presents to the ED from HD because she was unable to get up the stairs because of swelling of her legs and weakness and for evaluation ? Leucocytosis- r/o complicated Uti especially with h/o left hydronephrosis due to ureteric stone and having a stent placed in Jan 2023 Has ESBL ecoli and proteus- could be contaminants but in the setting of stent and worsening leucocytosis wil treat with meropenem Also will get US kidney to look for resolution of hydronephrosis  Somnolence could be due to CO2 narcosis- she has OSA/Obesity hypoventilation syndrome and CHF Needs BIPAP  Hypothyroidism- on synthroid- check TFTS   CHF  ESRD on dialysis  Lymphedema legs  Morbid obesity  H/O PE/Afib- on eliquis  ___________________________________________________ Discussed with requesting provider ID will follow her peripherally- call if needed Note:  This document was prepared using Dragon voice recognition software and may include unintentional dictation errors.

## 2021-03-25 NOTE — Progress Notes (Signed)
Pt too drowsy to properly perform Metaneb

## 2021-03-25 NOTE — Progress Notes (Addendum)
° °  PAPATIENT NAME: Nicole Schmidt    MR#:  [id number]  DATE OF BIRTH:  September 11, 1965 DATE:   03/25/2021  Subjective: Family reports concern regarding patients ongoing sleepiness and demanding ABG.  Family reports to me at bedside that patient had similar episode a week ago and required BIPAP for elevated CO2 . They feel her mentation has worsened today and she has not been on her CPAP today while asleep   Objective: Vitals:   03/25/21 1923 03/25/21 2053  BP: (!) 120/53 (!) 111/56  Pulse: 75 77  Resp: 20 18  Temp: 97.8 F (36.6 C)   SpO2: 92% 92%  Oxygen sats have been good with 5 l nasal cannula oxygen  leukocytosis noted improvement 18 from 22 on  daily labs today - Fosphomycin treatment complete for ESBL proteus an E coli in urine Currently on meropenem Assessment:  General: Obese, No apparent distress Neuro: Weakly responsive to noxious stimuli and slurred speech with verbalization.  Eyes not open for me when awakened (change from previous reported assessment Pulm - Decreased air flow. Sats good with 5 L supplemental [place] oxygen CV - generalized edema.  S1S2 irregular  Plan:  ABG - unobtainable VBG - 7.12 - 90 - 55 - 29.3 cardiac monitoring Continuous pulse ox Acute on chronic hypercapnic, hypoxic respiratory failure as evidenced by respiratory acidosis Plan for BIPAP stepdown, intensivist consult - discussed and plan for repeat vbg in 2 hours that had  no significant improvement 7.13 - 87 - 36 - 28.9 Confirmed plan for transfer to stepdown with PCCM consult  Altered mental status Secondary to above. Frequent neuro checks. Plan for CT head if no improvement once CO2 improved  Upon arrival to ICU, patient reexamined by me. She is now alert and oriented. She stated "I don't know what happened".  Discussed with patient that her CO2 level had went up again and she said she uses CPAP. Discussed with her that she was in need of BIPAP therapy now at all times when sleeping. BIPAP mode  changed to AVAPS  per respiratory therapist Will repeat VBG in about an hour.  PCCM consult cancelled  High risk morbidity /mortality risk with  critical state of acute on chronic respiratory failure. Shr is high risk for intubation need and cardiac arrest Critical care time 60 min  Critical care time evaluating patient, ordering/interpreting labs, family discussions regarding plan of care.  Sharion Settler NP [place]

## 2021-03-25 NOTE — Progress Notes (Signed)
[facility]  PROGRESS NOTE   Subjective:   Increased drowsiness   Tolerated dialysis well yesterday UF 2.5L acheived   Objective:  Vital signs in last 24 hours:  Temp:  [97.6 F (36.4 C)-98.3 F (36.8 C)] 97.6 F (36.4 C) (02/10 0732) Pulse Rate:  [76-100] 83 (02/10 0732) Resp:  [19-38] 20 (02/10 0507) BP: (111-159)/(46-82) 131/58 (02/10 0858) SpO2:  [88 %-100 %] 93 % (02/10 0732) Weight:  [156.8 kg-160.2 kg] 160.2 kg (02/10 0500)  Weight change: -1.8 kg Filed Weights   03/24/21 0900 03/24/21 1215 03/25/21 0500  Weight: (!) 159.9 kg (!) 156.8 kg (!) 160.2 kg    Intake/Output: I/O last 3 completed shifts: In: -  Out: 2500 [Other:2500]   Intake/Output this shift:  No intake/output data recorded.  Physical Exam: General:  No acute distress, laying in bed  Head:  Normocephalic, atraumatic. Moist oral mucosal membranes  Eyes:  Anicteric  Lungs:   Clear to auscultation, diminished at bases, 5L [place]  Heart:  irregular  Abdomen:   Soft, nontender, bowel sounds present, obese  Extremities:  + peripheral edema.  Neurologic:  Lethargic  Skin:  No lesions  Access:  RIJ permcath    Basic Metabolic Panel: Recent Labs  Lab 03/20/21 0939 03/20/21 0939 03/20/21 1204 03/20/21 1429 03/21/21 0547 03/22/21 0417 03/23/21 0524 03/24/21 0316 03/25/21 0350  NA 134*  --   --  133* 133* 133* 134*  --   --   K 5.3*  --   --  4.8 4.2 4.2 3.7  --   --   CL 97*  --   --  97* 97* 98 99  --   --   CO2 30  --   --  27 28 29 27   --   --   GLUCOSE 132*  --   --  105* 100* 96 107*  --   --   BUN 36*  --   --  40* 32* 36* 36*  --   --   CREATININE 4.52*  --  4.54* 4.57* 3.46* 3.36* 3.35*  --   --   CALCIUM 8.7*  --   --  8.8* 8.6* 8.6* 8.7*  --   --   MG  --    < >  --  2.0 1.8 2.0 1.9 2.3 2.2  PHOS  --    < >  --  6.4* 4.5 4.7* 3.9 5.1* 4.1   < > = values in this interval not displayed.     CBC: Recent Labs  Lab 03/20/21 0939 03/20/21 1204 03/20/21 1429  03/21/21 0547 03/22/21 0417 03/24/21 1200 03/25/21 1026  WBC 17.4*   < > 14.4* 13.7* 13.2* 22.3* 18.8*  NEUTROABS 13.5*  --   --   --  10.2*  --   --   HGB 8.0*   < > 7.8* 7.4* 7.5* 9.1* 8.7*  HCT 29.3*   < > 27.9* 26.0* 26.2* 31.8* 31.0*  MCV 101.4*   < > 100.0 95.9 97.4 97.2 99.0  PLT 266   < > 244 235 229 310 277   < > = values in this interval not displayed.      Urinalysis: No results for input(s): COLORURINE, LABSPEC, PHURINE, GLUCOSEU, HGBUR, BILIRUBINUR, KETONESUR, PROTEINUR, UROBILINOGEN, NITRITE, LEUKOCYTESUR in the last 72 hours.  Invalid input(s): APPERANCEUR     Imaging: No results found.   Medications:    sodium chloride Stopped (03/20/21 1758)    amLODipine  5 mg  Oral Daily   apixaban  5 mg Oral BID   atorvastatin  20 mg Oral QHS   bisacodyl  10 mg Rectal Daily   calcitRIOL  0.25 mcg Oral Daily   calcium carbonate  1 tablet Oral Daily   chlorhexidine  15 mL Mouth Rinse BID   Chlorhexidine Gluconate Cloth  6 each Topical q morning   epoetin (EPOGEN/PROCRIT) injection  10,000 Units Intravenous Q T,Th,Sa-HD   famotidine  10 mg Oral Daily   feeding supplement (NEPRO CARB STEADY)  237 mL Oral BID BM   folic acid  2,376 mcg Oral Daily   [START ON 03/27/2021] fosfomycin  3 g Oral Once   gabapentin  300 mg Oral QHS   Gerhardt's butt cream  3 application Topical TID   levothyroxine  112 mcg Oral QAC breakfast   mouth rinse  15 mL Mouth Rinse q12n4p   metoprolol succinate  25 mg Oral Daily   multivitamin  1 tablet Oral QHS   sevelamer carbonate  800 mg Oral TID WC   sodium chloride flush  3 mL Intravenous Q12H   Vitamin D (Ergocalciferol)  50,000 Units Oral Weekly    Assessment/ Plan:     Principal Problem:   Acute on chronic respiratory failure with hypoxia and hypercapnia (HCC) Active Problems:   History of breast cancer   Obstructive sleep apnea   Morbid obesity with BMI of 60.0-69.9, adult ([place])   Diabetes mellitus type 2 in obese (HCC)    Hypothyroidism   AF (paroxysmal atrial fibrillation) (HCC)   Physical deconditioning   Chronic respiratory failure with hypoxia (HCC)   ESRD (end stage renal disease) ([place])   Debility   Acute respiratory failure with hypoxia (HCC)   Pressure injury of skin   Acute respiratory failure with hypoxia and hypercapnia ([place])   Ambulatory dysfunction  Nicole Schmidt is a 56 y.o. female with morbid obesity, diabetes mellitus type II, peripheral vascular disease, hypertension, coronary artery disease, congestive heart failure, atrial fibrillation, acute kidney injury on chronic kidney disease requiring hemodialysis.  [facility] TTS [address] RIJ permcath 166.5kg  #1: End Stage Renal Disease: Admitted with some volume overload, now resolved with daily dialysis.  Received dialysis yesterday with UF 2.5L removed. Next treatment scheduled for Saturday. D?C planning to include CIR, dialysis coordinator available for any outpatient needs.  #2: Morbid obesity/obstructive sleep apnea: Pickwickian syndrome. Appreciate pulmonology input. Placed on Cpap overnight, but remains drowsy. Will place on Bipap this morning for somnolence.   #3: Hypertension: 131/58 current regimen of amlodipine, metoprolol and clonidine. Continue current treatment.  #4: Anemia of chronic kidney disease. Hemoglobin 7.5. Most likely contributing to patient's overall condition. EPO with TTS schedule   #5. Secondary Hyperparathyroidism: sevelamer with meals.     LOS: [place], MD [facility] 2/10/202311:23 AM

## 2021-03-25 NOTE — Progress Notes (Signed)
Occupational Therapy Treatment Patient Details Name: Nicole Schmidt MRN: [id number] DOB: 1965/10/08 Today's Date: 03/25/2021   History of present illness Nicole Schmidt is a 52yoF who [facility] after acute increase in edema limiting ability to walk. Pt was DC to home previous day from inpatient rehab in [place]. PMH: ESRD on HD TTS, DMN, HTN, HLD, AF, PE, anemia, dCHF. Pt was at CIR from 1/21-1/31.   OT comments  Nicole Schmidt was seen for OT treatment on this date. Upon arrival to room pt awake/alert, semi-supine in bed, endorsing significant fatigue from recent PT session and overall health concerns this date. Pt motivated to participate in session, but voices strong desire to engage in bed-level activity 2/2 fatigue. OT facilitated bed-level grooming and therapeutic exercises as described below. (See ADL section for additional detail). Pt requires set-up assist for UB ADL management with bed in chair position. She return verbalizes understanding of education on importance of functional activity during hospital stay and voices strong desire to return to PLOF.  Pt making good progress toward goals and continues to benefit from skilled OT services to maximize return to PLOF and minimize risk of future falls, injury, caregiver burden, and readmission. Will continue to follow POC. Discharge recommendation remains appropriate.     Recommendations for follow up therapy are one component of a multi-disciplinary discharge planning process, led by the attending physician.  Recommendations may be updated based on patient status, additional functional criteria and insurance authorization.    Follow Up Recommendations  Acute inpatient rehab (3hours/day)    Assistance Recommended at Discharge Frequent or constant Supervision/Assistance  Patient can return home with the following  A lot of help with walking and/or transfers;A lot of help with bathing/dressing/bathroom;Assistance with  cooking/housework;Assist for transportation;Help with stairs or ramp for entrance   Equipment Recommendations  Other (comment) ([place])    Recommendations for Other Services Rehab consult    Precautions / Restrictions Precautions Precautions: Fall Precaution Comments: O2 Restrictions Weight Bearing Restrictions: No RLE Weight Bearing: Weight bearing as tolerated LLE Weight Bearing: Weight bearing as tolerated       Mobility Bed Mobility Overal bed mobility: Needs Assistance             General bed mobility comments: Pt agreeable to bed level activity only 2/2 fatigue from heatlh concerns and recent PT session. Participates in therapy session with bed in chair position.    Transfers                         Balance Overall balance assessment: Needs assistance                                         ADL either performed or assessed with clinical judgement   ADL Overall ADL's : Needs assistance/impaired     Grooming: Wash/dry face;Oral care;Set up;Bed level Grooming Details (indicate cue type and reason): Performs oral care and face washing with bed in chair position.                             Functional mobility during ADLs: +2 for physical assistance;Moderate assistance General ADL Comments: Pt presents endorsing generalized fatigue from current health concerns and recent PT session. She is agreeable to bed-level activity and requires SET UP assist for UB grooming tasks.  Extremity/Trunk Assessment Upper Extremity Assessment Upper Extremity Assessment: Generalized weakness   Lower Extremity Assessment Lower Extremity Assessment: Generalized weakness        Vision Baseline Vision/History: 1 Wears glasses Ability to See in Adequate Light: 0 Adequate Patient Visual Report: No change from baseline     Perception     Praxis      Cognition Arousal/Alertness: Awake/alert Behavior During Therapy: WFL for tasks  assessed/performed Overall Cognitive Status: Within Functional Limits for tasks assessed                                 General Comments: Pleasant, conversational, A&Ox4. Endorsing significant fatigue since PT session but denies any other concerns this date.        Exercises Other Exercises Other Exercises: Pt educated on importance of functional activity during hospital stay, bed level therapeutic exercises including ankle circles/pumps, hip ab/adduction, & celing punches. OT facilitated bed-level ADL management.    Shoulder Instructions       General Comments      Pertinent Vitals/ Pain       Pain Assessment Pain Assessment: No/denies pain  Home Living                                          Prior Functioning/Environment              Frequency  Min 2X/week        Progress Toward Goals  OT Goals(current goals can now be found in the care plan section)  Progress towards OT goals: Progressing toward goals  Acute Rehab OT Goals Patient Stated Goal: To go got rehab OT Goal Formulation: With patient Time For Goal Achievement: 04/04/21 Potential to Achieve Goals: Good  Plan Discharge plan remains appropriate;Frequency remains appropriate    Co-evaluation                 AM-PAC OT "6 Clicks" Daily Activity     Outcome Measure   Help from another person eating meals?: None Help from another person taking care of personal grooming?: A Little Help from another person toileting, which includes using toliet, bedpan, or urinal?: A Lot Help from another person bathing (including washing, rinsing, drying)?: A Lot Help from another person to put on and taking off regular upper body clothing?: A Little Help from another person to put on and taking off regular lower body clothing?: A Lot 6 Click Score: 16    End of Session Equipment Utilized During Treatment: Oxygen (5Ls)  OT Visit Diagnosis: Unsteadiness on feet  (R26.81);Muscle weakness (generalized) (M62.81)   Activity Tolerance Patient tolerated treatment well   Patient Left in bed;with call bell/phone within reach;with bed alarm set   Nurse Communication Mobility status;Other (comment) (Pt in bed in chair position.)        Time: 1435-1459 OT Time Calculation (min): 24 min  Charges: OT General Charges $OT Visit: 1 Visit OT Treatments $Self Care/Home Management : 23-37 mins  Shara Blazing, M.S., OTR/L [facility] Ascom: [phone] 03/25/21, 3:58 PM

## 2021-03-25 NOTE — Progress Notes (Incomplete Revision)
° °  PAPATIENT NAME: Nicole Schmidt     MR#:  [id number]   DATE OF BIRTH:  12-23-1965 DATE:   03/25/2021 Subjective: Family reports concern regarding patients ongoing sleepiness and demanding ABG.  Family reports to me at bedside that patient had similar episode a week ago and required BIPAP for elevated CO2 . They feel her mentation has worsened today and she has not been on her CPAP today while asleep   Objective: Vitals:   03/25/21 1923 03/25/21 2053  BP: (!) 120/53 (!) 111/56  Pulse: 75 77  Resp: 20 18  Temp: 97.8 F (36.6 C)   SpO2: 92% 92%  Ovygen sats have been good with 5 l nasal cannula oxygen  leukocytosis noted improvement 18 from 22 on  daily labs today - Fosphomycin treatment complete for ESBL proteus an E coli in urine  Assessment:  General: Obese, No apparent distress Neuro: Weakly responsive to noxious stimuli and slurred speech with verbalization.  Eyes not open for me when awakened (change from previous reported assessment Pulm - Decreased air flow.  CV - generalized edema.  S1S2 irregular  Plan:

## 2021-03-26 ENCOUNTER — Inpatient Hospital Stay: Payer: BC Managed Care – PPO

## 2021-03-26 LAB — COMPREHENSIVE METABOLIC PANEL
ALT: 56 U/L — ABNORMAL HIGH (ref 0–44)
AST: 47 U/L — ABNORMAL HIGH (ref 15–41)
Albumin: 2.6 g/dL — ABNORMAL LOW (ref 3.5–5.0)
Alkaline Phosphatase: 84 U/L (ref 38–126)
Anion gap: 11 (ref 5–15)
BUN: 46 mg/dL — ABNORMAL HIGH (ref 6–20)
CO2: 26 mmol/L (ref 22–32)
Calcium: 9.2 mg/dL (ref 8.9–10.3)
Chloride: 94 mmol/L — ABNORMAL LOW (ref 98–111)
Creatinine, Ser: 4.91 mg/dL — ABNORMAL HIGH (ref 0.44–1.00)
GFR, Estimated: 10 mL/min — ABNORMAL LOW (ref >60)
Glucose, Bld: 98 mg/dL (ref 70–99)
Potassium: 4.7 mmol/L (ref 3.5–5.1)
Sodium: 131 mmol/L — ABNORMAL LOW (ref 135–145)
Total Bilirubin: 0.7 mg/dL (ref 0.3–1.2)
Total Protein: 6.6 g/dL (ref 6.5–8.1)

## 2021-03-26 LAB — BLOOD GAS, ARTERIAL
Acid-base deficit: 0.7 mmol/L (ref 0.0–2.0)
Acid-base deficit: 0.8 mmol/L (ref 0.0–2.0)
Bicarbonate: 26.9 mmol/L (ref 20.0–28.0)
Bicarbonate: 25.7 mmol/L (ref 20.0–28.0)
FIO2: 0.4
O2 Saturation: 58.8 %
O2 Saturation: 95.2 %
Patient temperature: 37
Patient temperature: 37
pCO2 arterial: 60 mmHg — ABNORMAL HIGH (ref 32.0–48.0)
pCO2 arterial: 51 mmHg — ABNORMAL HIGH (ref 32.0–48.0)
pH, Arterial: 7.26 — ABNORMAL LOW (ref 7.350–7.450)
pH, Arterial: 7.31 — ABNORMAL LOW (ref 7.350–7.450)
pO2, Arterial: 36 mmHg — CL (ref 83.0–108.0)
pO2, Arterial: 84 mmHg (ref 83.0–108.0)

## 2021-03-26 LAB — BLOOD GAS, VENOUS
Acid-base deficit: 1.1 mmol/L (ref 0.0–2.0)
Bicarbonate: 28.9 mmol/L — ABNORMAL HIGH (ref 20.0–28.0)
O2 Saturation: 48.9 %
Patient temperature: 37
pCO2, Ven: 87 mmHg (ref 44.0–60.0)
pH, Ven: 7.13 — CL (ref 7.250–7.430)
pO2, Ven: 36 mmHg (ref 32.0–45.0)

## 2021-03-26 LAB — PHOSPHORUS: Phosphorus: 6.5 mg/dL — ABNORMAL HIGH (ref 2.5–4.6)

## 2021-03-26 LAB — CORTISOL-AM, BLOOD: Cortisol - AM: 13.1 ug/dL (ref 6.7–22.6)

## 2021-03-26 LAB — MAGNESIUM: Magnesium: 2.3 mg/dL (ref 1.7–2.4)

## 2021-03-26 MED ORDER — HEPARIN SODIUM (PORCINE) 1000 UNIT/ML IJ SOLN: 1000.0000 [IU] | Freq: Once | INTRAMUSCULAR | Status: AC

## 2021-03-26 MED ORDER — HEPARIN SODIUM (PORCINE) 1000 UNIT/ML IJ SOLN
INTRAMUSCULAR | Status: AC
  Administered 2021-03-26: 4200 [IU]
  Filled 2021-03-26: qty 10

## 2021-03-26 NOTE — Progress Notes (Signed)
PCO2 elevated at 90, rpt-87. Pt placed on Bipap. Still lethargic.  Oncall informed. Pt transferred to ICU stepdown.

## 2021-03-26 NOTE — Progress Notes (Signed)
Patient alert and oriented x4, controlled Afibb, on 5L via [place] with stable pressures. Patient currently getting dialysis.

## 2021-03-26 NOTE — Progress Notes (Signed)
PT Cancellation Note  Patient Details Name: Nicole Schmidt MRN: [id number] DOB: Aug 10, 1965   Cancelled Treatment:    Reason Eval/Treat Not Completed: Patient not medically ready Pt noted to have transferred to ICU. PT to complete current orders per protocol. Pt will need either continue on transfer or new PT orders.   Lavone Nian, PT, DPT 03/26/21, 7:58 AM    [name] 03/26/2021, 7:57 AM

## 2021-03-26 NOTE — TOC Progression Note (Addendum)
Transition of Care [facility]) - Progression Note    Patient Details  Name: ROSELLE NORTON MRN: [id number] Date of Birth: Feb 01, 1966  Transition of Care [facility]) CM/SW [place], [place] Phone Number: 03/26/2021, 2:32 PM  Clinical Narrative:    CSW  spoke to Lab[place] with [employer] for [place]. TOC awaiting confirmation.  Patient is expected to go to [facility] Monday if stable. CSW spoke to [place] with [employer] and he reported [facility] will order BiPap for patient. If the patient goes home from [facility] and needs equipment, then re-contact [employer] for those needs.     Expected Discharge Plan: IP Rehab Facility Barriers to Discharge: Insurance Authorization  Expected Discharge Plan and Services Expected Discharge Plan: [place]   Discharge Planning Services: CM Consult Post Acute Care Choice: IP Rehab Living arrangements for the past 2 months: Single Family Home                 DME Arranged: N/A DME Agency: NA       HH Arranged: RN, PT, OT HH Agency:  ([facility] [place])         Social Determinants of Health (SDOH) Interventions    Readmission Risk Interventions Readmission Risk Prevention Plan 02/11/2020 10/19/2018  Transportation Screening Complete Complete  PCP or Specialist Appt within 3-5 Days Not Complete -  Not Complete comments To be scheduled when discharged. -  [place] or Home Care Consult Complete -  Social Work Consult for [place] Planning/Counseling Complete Patient refused  Palliative Care Screening Complete Not Applicable  Medication Review Press photographer) Complete Complete  Some recent data might be hidden

## 2021-03-26 NOTE — Progress Notes (Signed)
Pt still very lethargic. Family concerned. Oncall provider informed and ordered ABG.

## 2021-03-26 NOTE — Progress Notes (Signed)
Bipap changes: IPAP to 20 cm EPAP TO 8  Slight increase in MV from 10 to 11 LPM

## 2021-03-26 NOTE — Progress Notes (Signed)
OT Cancellation Note  Patient Details Name: Nicole Schmidt MRN: [id number] DOB: 1965-12-29   Cancelled Treatment:    Reason Eval/Treat Not Completed: Medical issues which prohibited therapy Pt transferred to higher level of care d/t lethargy with elevated PCO2. Will cancel current order per protocol and await new orders should pt become appropriate to participate. Thank you.  Gerrianne Scale, [place], OTR/L ascom [phone] 03/26/21, 9:05 AM

## 2021-03-26 NOTE — Progress Notes (Signed)
[name] Progress Note Patient Name: Nicole Schmidt DOB: Apr 02, 1965 MRN: [id number]   Date of Service  03/26/2021  HPI/Events of Note  56 year old morbidly obese female with ESRD admitted for increased swelling due to incomplete HD in setting of hypotension. Overnight had worsening lethargy and placed on BiPAP. Not adherent to CPAP while sleeping today.  Unchanged VBG with pH 08/25/85 despite BiPAP x 4 hours  On camera check: Appears comfortable on BiPAP  eICU Interventions  Continue BiPAP ABG now Monitor mental status     Intervention Category Evaluation Type: New Patient Evaluation  Tayley Mudrick Rodman Pickle 03/26/2021, 3:49 AM

## 2021-03-26 NOTE — Progress Notes (Signed)
Hemodialysis notes  Hd treatment completed, tolerated well, no complications noted. Pt was asymptomatic all throughout the treatment. Total treatment time was 3 hours. Total UF net removed was 2L.

## 2021-03-26 NOTE — Progress Notes (Signed)
RT UPDATE: Patient was transferred to ICU and placed on v60 per AVAPS mode. See flowsheet. Per ICU provider and NP-Morrison. Patient at that point was more awake and talking. During the transition process, gas was obtained to check ventilation. Vbg noted vs abg. I informed NP B Randol Kern as she had ordered another vbg for this patient and asked her to cancel the order. I informed lab that sample had been obtained.

## 2021-03-26 NOTE — Progress Notes (Addendum)
Patient is alert and verbal upon arrival. Bipap is place. Oriented to self and place only. She asked about her cousin Daine Floras and stated that she did know what the date or time was.She insisted that this Probation officer call he cousin Susie. She was reoriented to date and time by this Probation officer. Patient is now resting with eyes closed on the bipap. Call bell in reach. Will continue to monitor. Manuela Schwartz was called and updated on patient status.

## 2021-03-26 NOTE — Progress Notes (Signed)
[facility]  PROGRESS NOTE   Subjective:   Overnight events reviewed. Patient became confused. VBG with hypercapnea. Patient placed on BIPAP and moved to ICU   Patient awake and alert this morning. Off ventilation. Patient states she does not remember the events.   Patient is scheduled for her usual dialysis today.    Objective:  Vital signs in last 24 hours:  Temp:  [97.7 F (36.5 C)-98 F (36.7 C)] 97.9 F (36.6 C) (02/11 0800) Pulse Rate:  [62-91] 91 (02/11 0800) Resp:  [16-20] 20 (02/11 0800) BP: (103-155)/(41-66) 112/45 (02/11 0800) SpO2:  [89 %-99 %] 95 % (02/11 0800) FiO2 (%):  [50 %] 50 % (02/11 0800) Weight:  [158.2 kg] 158.2 kg (02/11 0500)  Weight change: -1.7 kg Filed Weights   03/24/21 1215 03/25/21 0500 03/26/21 0500  Weight: (!) 156.8 kg (!) 160.2 kg (!) 158.2 kg    Intake/Output: I/O last 3 completed shifts: In: 210.2 [P.O.:120; IV Piggyback:90.2] Out: 0    Intake/Output this shift:  No intake/output data recorded.  Physical Exam: General:  No acute distress, laying in bed  Head:  Normocephalic, atraumatic. Moist oral mucosal membranes  Eyes:  Anicteric  Lungs:   diminished at bases, 5L [place] O2  Heart:  irregular  Abdomen:   Soft, nontender, bowel sounds present, obese  Extremities:  + peripheral edema.  Neurologic:  Awake, moving all 4 extremities. Alert and oriented x 4  Skin:  No lesions  Access:  RIJ permcath    Basic Metabolic Panel: Recent Labs  Lab 03/20/21 1429 03/21/21 0547 03/22/21 0417 03/23/21 0524 03/24/21 0316 03/25/21 0350 03/26/21 0153  NA 133* 133* 133* 134*  --   --  131*  K 4.8 4.2 4.2 3.7  --   --  4.7  CL 97* 97* 98 99  --   --  94*  CO2 27 28 29 27   --   --  26  GLUCOSE 105* 100* 96 107*  --   --  98  BUN 40* 32* 36* 36*  --   --  46*  CREATININE 4.57* 3.46* 3.36* 3.35*  --   --  4.91*  CALCIUM 8.8* 8.6* 8.6* 8.7*  --   --  9.2  MG 2.0 1.8 2.0 1.9 2.3 2.2 2.3  PHOS 6.4* 4.5 4.7* 3.9 5.1* 4.1 6.5*      CBC: Recent Labs  Lab 03/20/21 0939 03/20/21 1204 03/20/21 1429 03/21/21 0547 03/22/21 0417 03/24/21 1200 03/25/21 1026  WBC 17.4*   < > 14.4* 13.7* 13.2* 22.3* 18.8*  NEUTROABS 13.5*  --   --   --  10.2*  --   --   HGB 8.0*   < > 7.8* 7.4* 7.5* 9.1* 8.7*  HCT 29.3*   < > 27.9* 26.0* 26.2* 31.8* 31.0*  MCV 101.4*   < > 100.0 95.9 97.4 97.2 99.0  PLT 266   < > 244 235 229 310 277   < > = values in this interval not displayed.      Urinalysis: No results for input(s): COLORURINE, LABSPEC, PHURINE, GLUCOSEU, HGBUR, BILIRUBINUR, KETONESUR, PROTEINUR, UROBILINOGEN, NITRITE, LEUKOCYTESUR in the last 72 hours.  Invalid input(s): APPERANCEUR     Imaging: US RENAL  Result Date: 03/25/2021 CLINICAL DATA:  Hydronephrosis EXAM: RENAL / URINARY TRACT ULTRASOUND COMPLETE COMPARISON:  CT done on 02/12/2021 FINDINGS: Right Kidney: Renal measurements: 10.9 x 6.9 x 6.2 cm = volume: 243.9 mL. There is no hydronephrosis. There is increased cortical echogenicity. There  are few cysts each measuring less than 2.2 cm. Left Kidney: Renal measurements: 11.7 by 6 x 6.3 cm = volume: 229.73 mL. There is increased cortical echogenicity. There is no hydronephrosis. There is 11 mm cyst in the midportion. There is interval resolution of left hydronephrosis Bladder: Not distended and not adequately evaluated. Other: None. IMPRESSION: There is no hydronephrosis. Increased cortical echogenicity suggests medical renal disease. Small bilateral renal cysts. Electronically Signed   By: Elmer Picker M.D.   On: 03/25/2021 18:10     Medications:    sodium chloride Stopped (03/20/21 1758)   meropenem (MERREM) IV 500 mg (03/25/21 1432)    amLODipine  5 mg Oral Daily   apixaban  5 mg Oral BID   atorvastatin  20 mg Oral QHS   bisacodyl  10 mg Rectal Daily   calcitRIOL  0.25 mcg Oral Daily   calcium carbonate  1 tablet Oral Daily   chlorhexidine  15 mL Mouth Rinse BID   Chlorhexidine Gluconate Cloth  6  each Topical q morning   epoetin (EPOGEN/PROCRIT) injection  10,000 Units Intravenous Q T,Th,Sa-HD   famotidine  10 mg Oral Daily   feeding supplement (NEPRO CARB STEADY)  237 mL Oral BID BM   folic acid  9,417 mcg Oral Daily   gabapentin  300 mg Oral QHS   Gerhardt's butt cream  3 application Topical TID   levothyroxine  112 mcg Oral QAC breakfast   mouth rinse  15 mL Mouth Rinse q12n4p   metoprolol succinate  25 mg Oral Daily   multivitamin  1 tablet Oral QHS   sevelamer carbonate  800 mg Oral TID WC   sodium chloride flush  3 mL Intravenous Q12H   Vitamin D (Ergocalciferol)  50,000 Units Oral Weekly    Assessment/ Plan:      Nicole Schmidt is a 56 y.o.white female with end stage renal disease on  hemodialysis, morbid obesity BMI > 59, diabetes mellitus type II, peripheral vascular disease, hypertension, coronary artery disease, congestive heart failure, atrial fibrillation, sleep apnea who is admitted to [facility] on 03/17/2021 for ESRD (end stage renal disease) ([place]) [N18.6] Physical deconditioning [R53.81] Acute respiratory failure with hypoxia ([place]) [J96.01] Ambulatory dysfunction [R26.2] Acute respiratory failure with hypoxia and hypercapnia ([place]) [J96.01, J96.02]  Patient was just released from rehab prior to discharge.   [facility] TTS [address] RIJ permcath   #1: End Stage Renal Disease: Admitted with some volume overload, now resolved with daily dialysis.   - Next hemodialysis treatment scheduled for later today. Orders prepared.   #2: Acute respiratory failure with obstructive sleep apnea with Pickwickian syndrome. Appreciate pulmonology input. Placed on BIPAP yesterday and overnight.   #3: Hypertension: with atrial fibrillation. Current regimen of amlodipine and metoprolol.   #4: Anemia of chronic kidney disease. Hemoglobin 8.7. Most likely contributing to patient's overall condition.  - EPO with TTS schedule  #5. Secondary Hyperparathyroidism: with  hyperphosphatemia - sevelamer with meals.  - calcitriol  - discontinue calcium carbonate (Os-Cal)    LOS: [place], MD [facility] 2/11/202310:33 AM

## 2021-03-26 NOTE — Progress Notes (Signed)
Pt lethargic, arousable on verbal command but dozes back to sleep. V/S stable - Bp-120/53,HR-75,RR-20,02 sat-92 on 5lpm/[place]. Oncall provider informed.

## 2021-03-26 NOTE — Progress Notes (Signed)
[place] at [place] NAME: Nicole Schmidt    MR#:  [id number]  DATE OF BIRTH:  10-Dec-1965  SUBJECTIVE:  patient became lethargic last evening. VPG was obtained showed elevated CO2. Patient was transferred to ICU for continuous BiPAP.  This morning is awake alert and oriented. Patient does not remember all the events from yesterday. Ate good breakfast. No family at bedside. No fever   Assessment and Plan:  56 yo female recently discharged home from [place] on 02/1.  She presented to [facility] ER on 02/2 due to an inability to ambulate up and down her steps due to worsening generalized swelling.  She does have a hx of ESRD and required EMS transport to her first outpatient HD session on 02/2.  However, due to hypotension she only had 1.7L of fluid removed during initial outpatient HD session.  Following her HD treatment she was transported to the ER for further evaluation of her generalized weakness and difficulty with ambulation.   Acute on chronic hypercapnia hypoxic respiratory failure multifactorial -- currently stable on 5 L nasal cannula -- wean as tolerated -- PRN bronchodilators -- will check ABG today. Given higher severe hypercapnia and hypoxia patient will benefit from trilogy. TOC informed  end-stage renal disease on hemodialysis/pulmonary edema -- continue dialysis with ultrafiltration. -- Patient tolerating it well. -- Nephrology consultation appreciated -- patient gets dialysis Tuesday Thursday Saturday  Leukocytosis H/o left ureteral stone s/p stent placement on 02/14/2021 by Dr Diamantina Providence --urine culture positive for ESBL Proteus and E. Coli -- pt's white count increased to 22k (13K)--18K -- no other source of infection noted. -- Patient received dose of Fosphomycin 2/9 -- discussed with infectious disease Dr. Steva Ready to see patient. White count is still elevated. Will change to IV meropenem -- repeat  ultrasound shows resolution of left hydronephrosis   acute on chronic diastolic congestive heart failure Hypertension Hyperlipidemia history of PE -- continue BP meds and statins -- continue eliquis  anemia of chronic disease -- hemoglobin stable  Hypothyroidism -- continue Synthroid  Type II diabetes with end-stage renal disease -- sugars have been well-controlled -- continue sliding scale  morbid obesity following our with suspected sleep apnea -- complicates recovery and prognosis   TOC for discharge planning to CIR versus rehab.   VITALS:  Blood pressure (!) 102/53, pulse 68, temperature 97.7 F (36.5 C), temperature source Oral, resp. rate 19, height 5\' 4"  (1.626 m), weight (!) 158.2 kg, last menstrual period 07/17/2012, SpO2 93 %.  PHYSICAL EXAMINATION:   GENERAL:  56 y.o.-year-old patient lying in the bed with no acute distress. Morbidly obese HEENT: Head atraumatic, normocephalic. Oropharynx and nasopharynx clear.  LUNGS: decreased breath sounds bilaterally, no wheezing, rales, rhonchi.  CARDIOVASCULAR: S1, S2 normal. No murmurs, rubs, or gallops.  ABDOMEN: Soft, nontender, nondistended. Bowel sounds present.  EXTREMITIES: ++ bilateral  edema b/l.    NEUROLOGIC: nonfocal SKIN:  Pressure Injury 03/20/21 Sacrum Stage 2 -  Partial thickness loss of dermis presenting as a shallow open injury with a red, pink wound bed without slough. area of redenss and broken skin, previous pressure injury, healing (Active)  03/20/21 1920  Location: Sacrum  Location Orientation:   Staging: Stage 2 -  Partial thickness loss of dermis presenting as a shallow open injury with a red, pink wound bed without slough.  Wound Description (Comments): area of redenss and broken skin, previous pressure injury, healing  Present on Admission: Yes  LABORATORY PANEL:  CBC Recent Labs  Lab 03/25/21 1026  WBC 18.8*  HGB 8.7*  HCT 31.0*  PLT 277     Chemistries  Recent Labs   Lab 03/26/21 0153  NA 131*  K 4.7  CL 94*  CO2 26  GLUCOSE 98  BUN 46*  CREATININE 4.91*  CALCIUM 9.2  MG 2.3  AST 47*  ALT 56*  ALKPHOS 84  BILITOT 0.7    Cardiac Enzymes No results for input(s): TROPONINI in the last 168 hours. RADIOLOGY:  US RENAL  Result Date: 03/25/2021 CLINICAL DATA:  Hydronephrosis EXAM: RENAL / URINARY TRACT ULTRASOUND COMPLETE COMPARISON:  CT done on 02/12/2021 FINDINGS: Right Kidney: Renal measurements: 10.9 x 6.9 x 6.2 cm = volume: 243.9 mL. There is no hydronephrosis. There is increased cortical echogenicity. There are few cysts each measuring less than 2.2 cm. Left Kidney: Renal measurements: 11.7 by 6 x 6.3 cm = volume: 229.73 mL. There is increased cortical echogenicity. There is no hydronephrosis. There is 11 mm cyst in the midportion. There is interval resolution of left hydronephrosis Bladder: Not distended and not adequately evaluated. Other: None. IMPRESSION: There is no hydronephrosis. Increased cortical echogenicity suggests medical renal disease. Small bilateral renal cysts. Electronically Signed   By: Elmer Picker M.D.   On: 03/25/2021 18:10      Procedures: Family communication : discussed with patient's cousin Nicole Schmidt on the phone Consults : nephrology, pulmonary CODE STATUS: full DVT Prophylaxis : eliquis Level of care: Stepdown Status is: Inpatient Remains inpatient appropriate because:  discussed with patient's cousin/family Nicole Schmidt. She understands patient overall care is a poor prognosis          TOTAL TIME TAKING CARE OF THIS PATIENT: 30 minutes.  >50% time spent on counselling and coordination of care  Note: This dictation was prepared with Dragon dictation along with smaller phrase technology. Any transcriptional errors that result from this process are unintentional.  Fritzi Mandes M.D    [facility]   CC: Primary care physician; Lenard Simmer, MD

## 2021-03-27 LAB — CBC
HCT: 29 % — ABNORMAL LOW (ref 36.0–46.0)
Hemoglobin: 8.4 g/dL — ABNORMAL LOW (ref 12.0–15.0)
MCH: 27.9 pg (ref 26.0–34.0)
MCHC: 29 g/dL — ABNORMAL LOW (ref 30.0–36.0)
MCV: 96.3 fL (ref 80.0–100.0)
Platelets: 312 10*3/uL (ref 150–400)
RBC: 3.01 MIL/uL — ABNORMAL LOW (ref 3.87–5.11)
RDW: 16.6 % — ABNORMAL HIGH (ref 11.5–15.5)
WBC: 12.2 10*3/uL — ABNORMAL HIGH (ref 4.0–10.5)
nRBC: 1.5 % — ABNORMAL HIGH (ref 0.0–0.2)

## 2021-03-27 LAB — GLUCOSE, CAPILLARY: Glucose-Capillary: 138 mg/dL — ABNORMAL HIGH (ref 70–99)

## 2021-03-27 LAB — PHOSPHORUS: Phosphorus: 3.2 mg/dL (ref 2.5–4.6)

## 2021-03-27 LAB — MAGNESIUM: Magnesium: 2.1 mg/dL (ref 1.7–2.4)

## 2021-03-27 NOTE — Progress Notes (Signed)
[place] at [place] NAME: Nicole Schmidt    MR#:  [id number]  DATE OF BIRTH:  [birth year]  SUBJECTIVE:  patient became lethargic last evening. VPG was obtained showed elevated CO2. Patient was transferred to ICU for continuous BiPAP.  This morning is awake alert and oriented.Ate good breakfast. No family at bedside. No fever sats more than 92% on 4.5 L oxygen.   Assessment and Plan:  56 yo female recently discharged home from [place] on 02/1.  She presented to [facility] ER on 02/2 due to an inability to ambulate up and down her steps due to worsening generalized swelling.  She does have a hx of ESRD and required EMS transport to her first outpatient HD session on 02/2.  However, due to hypotension she only had 1.7L of fluid removed during initial outpatient HD session.  Following her HD treatment she was transported to the ER for further evaluation of her generalized weakness and difficulty with ambulation.   Acute on chronic hypercapnia hypoxic respiratory failure multifactorial -- currently stable on 5 L nasal cannula -- wean as tolerated -- PRN bronchodilators -- will check ABG today. Given higher severe hypercapnia and hypoxia patient will benefit from trilogy. TOC informed  end-stage renal disease on hemodialysis/pulmonary edema -- continue dialysis with ultrafiltration. -- Patient tolerating it well. -- Nephrology consultation appreciated -- patient gets dialysis Tuesday Thursday Saturday  Leukocytosis H/o left ureteral stone s/p stent placement on 02/14/2021 by Dr Diamantina Providence --urine culture positive for ESBL Proteus and E. Coli -- pt's white count increased to 22k (13K)--18K -- no other source of infection noted. -- Patient received dose of Fosphomycin 2/9 -- discussed with infectious disease Dr. Steva Ready to see patient. White count is still elevated. Will change to IV meropenem -- repeat ultrasound shows  resolution of left hydronephrosis   acute on chronic diastolic congestive heart failure Hypertension Hyperlipidemia history of PE -- continue BP meds and statins -- continue eliquis  anemia of chronic disease -- hemoglobin stable  Hypothyroidism -- continue Synthroid  Type II diabetes with end-stage renal disease -- sugars have been well-controlled -- continue sliding scale  morbid obesity following our with suspected sleep apnea -- complicates recovery and prognosis   TOC for discharge planning to CIR versus rehab.   VITALS:  Blood pressure (!) 99/40, pulse 90, temperature 98.4 F (36.9 C), temperature source Oral, resp. rate (!) 27, height 5\' 4"  (1.626 m), weight (!) 155.7 kg, last menstrual period 07/17/2012, SpO2 93 %.  PHYSICAL EXAMINATION:   GENERAL:  56 y.o.-year-old patient lying in the bed with no acute distress. Morbidly obese HEENT: Head atraumatic, normocephalic. Oropharynx and nasopharynx clear.  LUNGS: decreased breath sounds bilaterally, no wheezing, rales, rhonchi.  CARDIOVASCULAR: S1, S2 normal. No murmurs, rubs, or gallops.  ABDOMEN: Soft, nontender, nondistended. Bowel sounds present.  EXTREMITIES: + bilateral  edema b/l.    NEUROLOGIC: nonfocal SKIN:  Pressure Injury 03/20/21 Sacrum Stage 2 -  Partial thickness loss of dermis presenting as a shallow open injury with a red, pink wound bed without slough. area of redenss and broken skin, previous pressure injury, healing (Active)  03/20/21 1920  Location: Sacrum  Location Orientation:   Staging: Stage 2 -  Partial thickness loss of dermis presenting as a shallow open injury with a red, pink wound bed without slough.  Wound Description (Comments): area of redenss and broken skin, previous pressure injury, healing  Present on Admission: Yes  LABORATORY PANEL:  CBC Recent Labs  Lab 03/27/21 0215  WBC 12.2*  HGB 8.4*  HCT 29.0*  PLT 312     Chemistries  Recent Labs  Lab 03/26/21 0153  03/27/21 0215  NA 131*  --   K 4.7  --   CL 94*  --   CO2 26  --   GLUCOSE 98  --   BUN 46*  --   CREATININE 4.91*  --   CALCIUM 9.2  --   MG 2.3 2.1  AST 47*  --   ALT 56*  --   ALKPHOS 84  --   BILITOT 0.7  --     Cardiac Enzymes No results for input(s): TROPONINI in the last 168 hours. RADIOLOGY:  US RENAL  Result Date: 03/25/2021 CLINICAL DATA:  Hydronephrosis EXAM: RENAL / URINARY TRACT ULTRASOUND COMPLETE COMPARISON:  CT done on 02/12/2021 FINDINGS: Right Kidney: Renal measurements: 10.9 x 6.9 x 6.2 cm = volume: 243.9 mL. There is no hydronephrosis. There is increased cortical echogenicity. There are few cysts each measuring less than 2.2 cm. Left Kidney: Renal measurements: 11.7 by 6 x 6.3 cm = volume: 229.73 mL. There is increased cortical echogenicity. There is no hydronephrosis. There is 11 mm cyst in the midportion. There is interval resolution of left hydronephrosis Bladder: Not distended and not adequately evaluated. Other: None. IMPRESSION: There is no hydronephrosis. Increased cortical echogenicity suggests medical renal disease. Small bilateral renal cysts. Electronically Signed   By: Elmer Picker M.D.   On: 03/25/2021 18:10   DG Chest Port 1 View  Result Date: 03/26/2021 CLINICAL DATA:  Acute respiratory failure. EXAM: PORTABLE CHEST 1 VIEW COMPARISON:  Chest radiograph dated 03/22/2021. FINDINGS: Dialysis catheter in similar position. Similar appearance of cardiomegaly with vascular congestion and edema. Patchy area of opacity in the right upper lung field progressed since the prior radiograph and may represent atelectasis or developing infiltrate. No large pleural effusion. No pneumothorax. Atherosclerotic calcification of the aorta. No acute osseous pathology. Degenerative changes of the spine. IMPRESSION: 1. Cardiomegaly with vascular congestion and edema. 2. Patchy area of opacity in the right upper lung field progressed since the prior radiograph and may  represent atelectasis or developing infiltrate. Electronically Signed   By: Anner Crete M.D.   On: 03/26/2021 19:25      Procedures: Family communication : discussed with patient's cousin Nicole Schmidt on the phone Consults : nephrology, pulmonary CODE STATUS: full DVT Prophylaxis : eliquis Level of care: Progressive Status is: Inpatient Remains inpatient appropriate because:  discussed with patient's cousin/family Nicole Schmidt. She understands patient overall care is a poor prognosis          TOTAL TIME TAKING CARE OF THIS PATIENT: 30 minutes.  >50% time spent on counselling and coordination of care  Note: This dictation was prepared with Dragon dictation along with smaller phrase technology. Any transcriptional errors that result from this process are unintentional.  Fritzi Mandes M.D    [facility]   CC: Primary care physician; Lenard Simmer, MD

## 2021-03-27 NOTE — Plan of Care (Signed)
Pt reports feeling better this am overall. Denies pain or SOB. VSS and telemetry shows afib. Verbalized feeling fatigued today, and feels anxious and afraid she will fall asleep as she requires BiPap when sleeping OR napping. Allowed to take nap with Bipap; sleeping at this time. Transfer orders received for PCU. Ate well at breakfast.   Problem: Health Behavior/Discharge Planning: Goal: Ability to manage health-related needs will improve Outcome: Progressing   Problem: Clinical Measurements: Goal: Ability to maintain clinical measurements within normal limits will improve Outcome: Progressing Goal: Will remain free from infection Outcome: Progressing Goal: Diagnostic test results will improve Outcome: Progressing Goal: Respiratory complications will improve Outcome: Progressing Goal: Cardiovascular complication will be avoided Outcome: Progressing   Problem: Pain Managment: Goal: General experience of comfort will improve Outcome: Progressing

## 2021-03-27 NOTE — Progress Notes (Signed)
[facility]  PROGRESS NOTE   Subjective:   Patient awake and alert this morning. Asking to be moved to a chair. Patient is breathing without difficulty.   BIPAP overnight  Hemodialysis treatment yesterday. UF of 2 liters.   Objective:  Vital signs in last 24 hours:  Temp:  [97.7 F (36.5 C)-98.7 F (37.1 C)] 98.5 F (36.9 C) (02/12 0800) Pulse Rate:  [64-107] 84 (02/12 0823) Resp:  [17-30] 21 (02/12 0823) BP: (99-151)/(44-70) 134/45 (02/12 0823) SpO2:  [89 %-99 %] 94 % (02/12 0823) Weight:  [153.6 kg-156.2 kg] 155.7 kg (02/12 0500)  Weight change: -2 kg Filed Weights   03/26/21 1505 03/26/21 1851 03/27/21 0500  Weight: (!) 156.2 kg (!) 153.6 kg (!) 155.7 kg    Intake/Output: I/O last 3 completed shifts: In: 240 [P.O.:240] Out: 2000 [Other:2000]   Intake/Output this shift:  No intake/output data recorded.  Physical Exam: General:  No acute distress, laying in bed  Head:  Normocephalic, atraumatic. Moist oral mucosal membranes  Eyes:  Anicteric  Lungs:   diminished at bases, 4.5 L [place] O2  Heart:  irregular  Abdomen:   Soft, nontender, bowel sounds present, obese  Extremities:  + peripheral edema.  Neurologic:  Awake, moving all 4 extremities. Alert and oriented x 4  Skin:  No lesions  Access:  RIJ permcath    Basic Metabolic Panel: Recent Labs  Lab 03/20/21 1429 03/21/21 0547 03/22/21 0417 03/23/21 0524 03/24/21 0316 03/25/21 0350 03/26/21 0153 03/27/21 0215  NA 133* 133* 133* 134*  --   --  131*  --   K 4.8 4.2 4.2 3.7  --   --  4.7  --   CL 97* 97* 98 99  --   --  94*  --   CO2 27 28 29 27   --   --  26  --   GLUCOSE 105* 100* 96 107*  --   --  98  --   BUN 40* 32* 36* 36*  --   --  46*  --   CREATININE 4.57* 3.46* 3.36* 3.35*  --   --  4.91*  --   CALCIUM 8.8* 8.6* 8.6* 8.7*  --   --  9.2  --   MG 2.0 1.8 2.0 1.9 2.3 2.2 2.3 2.1  PHOS 6.4* 4.5 4.7* 3.9 5.1* 4.1 6.5* 3.2     CBC: Recent Labs  Lab 03/21/21 0547 03/22/21 0417  03/24/21 1200 03/25/21 1026 03/27/21 0215  WBC 13.7* 13.2* 22.3* 18.8* 12.2*  NEUTROABS  --  10.2*  --   --   --   HGB 7.4* 7.5* 9.1* 8.7* 8.4*  HCT 26.0* 26.2* 31.8* 31.0* 29.0*  MCV 95.9 97.4 97.2 99.0 96.3  PLT 235 229 310 277 312      Urinalysis: No results for input(s): COLORURINE, LABSPEC, PHURINE, GLUCOSEU, HGBUR, BILIRUBINUR, KETONESUR, PROTEINUR, UROBILINOGEN, NITRITE, LEUKOCYTESUR in the last 72 hours.  Invalid input(s): APPERANCEUR     Imaging: US RENAL  Result Date: 03/25/2021 CLINICAL DATA:  Hydronephrosis EXAM: RENAL / URINARY TRACT ULTRASOUND COMPLETE COMPARISON:  CT done on 02/12/2021 FINDINGS: Right Kidney: Renal measurements: 10.9 x 6.9 x 6.2 cm = volume: 243.9 mL. There is no hydronephrosis. There is increased cortical echogenicity. There are few cysts each measuring less than 2.2 cm. Left Kidney: Renal measurements: 11.7 by 6 x 6.3 cm = volume: 229.73 mL. There is increased cortical echogenicity. There is no hydronephrosis. There is 11 mm cyst in the midportion. There  is interval resolution of left hydronephrosis Bladder: Not distended and not adequately evaluated. Other: None. IMPRESSION: There is no hydronephrosis. Increased cortical echogenicity suggests medical renal disease. Small bilateral renal cysts. Electronically Signed   By: Elmer Picker M.D.   On: 03/25/2021 18:10   DG Chest Port 1 View  Result Date: 03/26/2021 CLINICAL DATA:  Acute respiratory failure. EXAM: PORTABLE CHEST 1 VIEW COMPARISON:  Chest radiograph dated 03/22/2021. FINDINGS: Dialysis catheter in similar position. Similar appearance of cardiomegaly with vascular congestion and edema. Patchy area of opacity in the right upper lung field progressed since the prior radiograph and may represent atelectasis or developing infiltrate. No large pleural effusion. No pneumothorax. Atherosclerotic calcification of the aorta. No acute osseous pathology. Degenerative changes of the spine. IMPRESSION:  1. Cardiomegaly with vascular congestion and edema. 2. Patchy area of opacity in the right upper lung field progressed since the prior radiograph and may represent atelectasis or developing infiltrate. Electronically Signed   By: Anner Crete M.D.   On: 03/26/2021 19:25     Medications:    sodium chloride Stopped (03/20/21 1758)   meropenem (MERREM) IV 500 mg (03/26/21 2034)    amLODipine  5 mg Oral Daily   apixaban  5 mg Oral BID   atorvastatin  20 mg Oral QHS   bisacodyl  10 mg Rectal Daily   calcitRIOL  0.25 mcg Oral Daily   chlorhexidine  15 mL Mouth Rinse BID   Chlorhexidine Gluconate Cloth  6 each Topical q morning   epoetin (EPOGEN/PROCRIT) injection  10,000 Units Intravenous Q T,Th,Sa-HD   famotidine  10 mg Oral Daily   feeding supplement (NEPRO CARB STEADY)  237 mL Oral BID BM   folic acid  0,017 mcg Oral Daily   gabapentin  300 mg Oral QHS   Gerhardt's butt cream  3 application Topical TID   levothyroxine  112 mcg Oral QAC breakfast   mouth rinse  15 mL Mouth Rinse q12n4p   metoprolol succinate  25 mg Oral Daily   multivitamin  1 tablet Oral QHS   sevelamer carbonate  800 mg Oral TID WC   sodium chloride flush  3 mL Intravenous Q12H   Vitamin D (Ergocalciferol)  50,000 Units Oral Weekly    Assessment/ Plan:      Nicole Schmidt is a 56 y.o.white female with end stage renal disease on  hemodialysis, morbid obesity BMI > 59, diabetes mellitus type II, peripheral vascular disease, hypertension, coronary artery disease, congestive heart failure, atrial fibrillation, sleep apnea who is admitted to [facility] on 03/17/2021 for ESRD (end stage renal disease) ([place]) [N18.6] Physical deconditioning [R53.81] Acute respiratory failure with hypoxia ([place]) [J96.01] Ambulatory dysfunction [R26.2] Acute respiratory failure with hypoxia and hypercapnia ([place]) [J96.01, J96.02]  Patient was just released from rehab prior to discharge.   [facility] TTS [address] RIJ permcath   #1: End  Stage Renal Disease: Admitted with some volume overload, now resolved with daily dialysis.   - Next hemodialysis treatment scheduled for Tuesday.  - Continue TTS  #2: Acute respiratory failure with obstructive sleep apnea with Pickwickian syndrome. Appreciate pulmonology input. Placed on BIPAP overnight.   #3: Hypertension: with atrial fibrillation. Current regimen of amlodipine and metoprolol.   #4: Anemia of chronic kidney disease. Hemoglobin 8.4.   - EPO with TTS schedule  #5. Secondary Hyperparathyroidism: with hyperphosphatemia - sevelamer with meals.  - calcitriol     LOS: 7 Lavonia Dana, MD [facility] 2/12/202310:19 AM

## 2021-03-28 ENCOUNTER — Other Ambulatory Visit: Payer: Self-pay

## 2021-03-28 ENCOUNTER — Encounter (HOSPITAL_COMMUNITY): Payer: Self-pay | Admitting: Physical Medicine and Rehabilitation

## 2021-03-28 ENCOUNTER — Inpatient Hospital Stay (HOSPITAL_COMMUNITY)
Admission: RE | Admit: 2021-03-28 | Discharge: 2021-04-15 | DRG: 945 | Disposition: A | Discharge status: Home Health | Payer: BC Managed Care – PPO | Source: Other Acute Inpatient Hospital | Attending: Physical Medicine and Rehabilitation | Admitting: Physical Medicine and Rehabilitation

## 2021-03-28 ENCOUNTER — Other Ambulatory Visit (HOSPITAL_COMMUNITY): Payer: Self-pay

## 2021-03-28 ENCOUNTER — Ambulatory Visit: Payer: BC Managed Care – PPO | Admitting: Physician Assistant

## 2021-03-28 DIAGNOSIS — Z86711 Personal history of pulmonary embolism: Secondary | ICD-10-CM | POA: Diagnosis not present

## 2021-03-28 DIAGNOSIS — I10 Essential (primary) hypertension: Secondary | ICD-10-CM | POA: Diagnosis not present

## 2021-03-28 DIAGNOSIS — R5381 Other malaise: Principal | ICD-10-CM

## 2021-03-28 DIAGNOSIS — K5900 Constipation, unspecified: Secondary | ICD-10-CM | POA: Diagnosis not present

## 2021-03-28 DIAGNOSIS — Z79890 Hormone replacement therapy (postmenopausal): Secondary | ICD-10-CM

## 2021-03-28 DIAGNOSIS — J9611 Chronic respiratory failure with hypoxia: Secondary | ICD-10-CM | POA: Diagnosis present

## 2021-03-28 DIAGNOSIS — N2581 Secondary hyperparathyroidism of renal origin: Secondary | ICD-10-CM | POA: Diagnosis not present

## 2021-03-28 DIAGNOSIS — E669 Obesity, unspecified: Secondary | ICD-10-CM | POA: Diagnosis not present

## 2021-03-28 DIAGNOSIS — I480 Paroxysmal atrial fibrillation: Secondary | ICD-10-CM | POA: Diagnosis not present

## 2021-03-28 DIAGNOSIS — J9811 Atelectasis: Secondary | ICD-10-CM | POA: Diagnosis not present

## 2021-03-28 DIAGNOSIS — I872 Venous insufficiency (chronic) (peripheral): Secondary | ICD-10-CM | POA: Diagnosis present

## 2021-03-28 DIAGNOSIS — M25571 Pain in right ankle and joints of right foot: Secondary | ICD-10-CM | POA: Diagnosis not present

## 2021-03-28 DIAGNOSIS — Z8249 Family history of ischemic heart disease and other diseases of the circulatory system: Secondary | ICD-10-CM | POA: Diagnosis not present

## 2021-03-28 DIAGNOSIS — I132 Hypertensive heart and chronic kidney disease with heart failure and with stage 5 chronic kidney disease, or end stage renal disease: Secondary | ICD-10-CM | POA: Diagnosis present

## 2021-03-28 DIAGNOSIS — G4733 Obstructive sleep apnea (adult) (pediatric): Secondary | ICD-10-CM

## 2021-03-28 DIAGNOSIS — M109 Gout, unspecified: Secondary | ICD-10-CM | POA: Diagnosis present

## 2021-03-28 DIAGNOSIS — E1129 Type 2 diabetes mellitus with other diabetic kidney complication: Secondary | ICD-10-CM | POA: Diagnosis not present

## 2021-03-28 DIAGNOSIS — D631 Anemia in chronic kidney disease: Secondary | ICD-10-CM | POA: Diagnosis not present

## 2021-03-28 DIAGNOSIS — D62 Acute posthemorrhagic anemia: Secondary | ICD-10-CM

## 2021-03-28 DIAGNOSIS — I120 Hypertensive chronic kidney disease with stage 5 chronic kidney disease or end stage renal disease: Secondary | ICD-10-CM | POA: Diagnosis not present

## 2021-03-28 DIAGNOSIS — E039 Hypothyroidism, unspecified: Secondary | ICD-10-CM | POA: Diagnosis present

## 2021-03-28 DIAGNOSIS — I4819 Other persistent atrial fibrillation: Secondary | ICD-10-CM | POA: Diagnosis not present

## 2021-03-28 DIAGNOSIS — E1165 Type 2 diabetes mellitus with hyperglycemia: Secondary | ICD-10-CM | POA: Diagnosis not present

## 2021-03-28 DIAGNOSIS — I959 Hypotension, unspecified: Secondary | ICD-10-CM | POA: Diagnosis not present

## 2021-03-28 DIAGNOSIS — Z833 Family history of diabetes mellitus: Secondary | ICD-10-CM | POA: Diagnosis not present

## 2021-03-28 DIAGNOSIS — M1990 Unspecified osteoarthritis, unspecified site: Secondary | ICD-10-CM | POA: Diagnosis present

## 2021-03-28 DIAGNOSIS — E1122 Type 2 diabetes mellitus with diabetic chronic kidney disease: Secondary | ICD-10-CM | POA: Diagnosis not present

## 2021-03-28 DIAGNOSIS — J9600 Acute respiratory failure, unspecified whether with hypoxia or hypercapnia: Secondary | ICD-10-CM | POA: Diagnosis not present

## 2021-03-28 DIAGNOSIS — Z7901 Long term (current) use of anticoagulants: Secondary | ICD-10-CM

## 2021-03-28 DIAGNOSIS — Z8261 Family history of arthritis: Secondary | ICD-10-CM

## 2021-03-28 DIAGNOSIS — J9621 Acute and chronic respiratory failure with hypoxia: Secondary | ICD-10-CM | POA: Diagnosis not present

## 2021-03-28 DIAGNOSIS — Z820 Family history of epilepsy and other diseases of the nervous system: Secondary | ICD-10-CM | POA: Diagnosis not present

## 2021-03-28 DIAGNOSIS — E662 Morbid (severe) obesity with alveolar hypoventilation: Secondary | ICD-10-CM | POA: Diagnosis present

## 2021-03-28 DIAGNOSIS — Z992 Dependence on renal dialysis: Secondary | ICD-10-CM

## 2021-03-28 DIAGNOSIS — J9622 Acute and chronic respiratory failure with hypercapnia: Secondary | ICD-10-CM | POA: Diagnosis not present

## 2021-03-28 DIAGNOSIS — M19071 Primary osteoarthritis, right ankle and foot: Secondary | ICD-10-CM | POA: Diagnosis present

## 2021-03-28 DIAGNOSIS — D72829 Elevated white blood cell count, unspecified: Secondary | ICD-10-CM | POA: Diagnosis present

## 2021-03-28 DIAGNOSIS — E119 Type 2 diabetes mellitus without complications: Secondary | ICD-10-CM

## 2021-03-28 DIAGNOSIS — E785 Hyperlipidemia, unspecified: Secondary | ICD-10-CM | POA: Diagnosis present

## 2021-03-28 DIAGNOSIS — Z9981 Dependence on supplemental oxygen: Secondary | ICD-10-CM | POA: Diagnosis not present

## 2021-03-28 DIAGNOSIS — Z79899 Other long term (current) drug therapy: Secondary | ICD-10-CM

## 2021-03-28 DIAGNOSIS — R0902 Hypoxemia: Secondary | ICD-10-CM | POA: Diagnosis not present

## 2021-03-28 DIAGNOSIS — I5032 Chronic diastolic (congestive) heart failure: Secondary | ICD-10-CM | POA: Diagnosis present

## 2021-03-28 DIAGNOSIS — Z6841 Body Mass Index (BMI) 40.0 and over, adult: Secondary | ICD-10-CM | POA: Diagnosis not present

## 2021-03-28 DIAGNOSIS — N186 End stage renal disease: Secondary | ICD-10-CM

## 2021-03-28 DIAGNOSIS — G4700 Insomnia, unspecified: Secondary | ICD-10-CM | POA: Diagnosis not present

## 2021-03-28 DIAGNOSIS — Z853 Personal history of malignant neoplasm of breast: Secondary | ICD-10-CM | POA: Diagnosis not present

## 2021-03-28 DIAGNOSIS — I2782 Chronic pulmonary embolism: Secondary | ICD-10-CM

## 2021-03-28 DIAGNOSIS — Z800 Family history of malignant neoplasm of digestive organs: Secondary | ICD-10-CM

## 2021-03-28 DIAGNOSIS — R262 Difficulty in walking, not elsewhere classified: Secondary | ICD-10-CM | POA: Diagnosis not present

## 2021-03-28 DIAGNOSIS — J811 Chronic pulmonary edema: Secondary | ICD-10-CM | POA: Diagnosis not present

## 2021-03-28 LAB — RENAL FUNCTION PANEL
Albumin: 2.8 g/dL — ABNORMAL LOW (ref 3.5–5.0)
Albumin: 2.4 g/dL — ABNORMAL LOW (ref 3.5–5.0)
Anion gap: 8 (ref 5–15)
Anion gap: 12 (ref 5–15)
BUN: 48 mg/dL — ABNORMAL HIGH (ref 6–20)
BUN: 51 mg/dL — ABNORMAL HIGH (ref 6–20)
CO2: 27 mmol/L (ref 22–32)
CO2: 24 mmol/L (ref 22–32)
Calcium: 9.4 mg/dL (ref 8.9–10.3)
Calcium: 9.4 mg/dL (ref 8.9–10.3)
Chloride: 95 mmol/L — ABNORMAL LOW (ref 98–111)
Chloride: 95 mmol/L — ABNORMAL LOW (ref 98–111)
Creatinine, Ser: 5.32 mg/dL — ABNORMAL HIGH (ref 0.44–1.00)
Creatinine, Ser: 5.84 mg/dL — ABNORMAL HIGH (ref 0.44–1.00)
GFR, Estimated: 9 mL/min — ABNORMAL LOW (ref >60)
GFR, Estimated: 8 mL/min — ABNORMAL LOW (ref >60)
Glucose, Bld: 125 mg/dL — ABNORMAL HIGH (ref 70–99)
Glucose, Bld: 153 mg/dL — ABNORMAL HIGH (ref 70–99)
Phosphorus: 4.9 mg/dL — ABNORMAL HIGH (ref 2.5–4.6)
Phosphorus: 4.8 mg/dL — ABNORMAL HIGH (ref 2.5–4.6)
Potassium: 3.7 mmol/L (ref 3.5–5.1)
Potassium: 3.9 mmol/L (ref 3.5–5.1)
Sodium: 130 mmol/L — ABNORMAL LOW (ref 135–145)
Sodium: 131 mmol/L — ABNORMAL LOW (ref 135–145)

## 2021-03-28 LAB — GLUCOSE, CAPILLARY
Glucose-Capillary: 86 mg/dL (ref 70–99)
Glucose-Capillary: 88 mg/dL (ref 70–99)
Glucose-Capillary: 144 mg/dL — ABNORMAL HIGH (ref 70–99)
Glucose-Capillary: 120 mg/dL — ABNORMAL HIGH (ref 70–99)
Glucose-Capillary: 170 mg/dL — ABNORMAL HIGH (ref 70–99)

## 2021-03-28 LAB — CBC
HCT: 30.3 % — ABNORMAL LOW (ref 36.0–46.0)
Hemoglobin: 8.5 g/dL — ABNORMAL LOW (ref 12.0–15.0)
MCH: 27.6 pg (ref 26.0–34.0)
MCHC: 28.1 g/dL — ABNORMAL LOW (ref 30.0–36.0)
MCV: 98.4 fL (ref 80.0–100.0)
Platelets: 307 10*3/uL (ref 150–400)
RBC: 3.08 MIL/uL — ABNORMAL LOW (ref 3.87–5.11)
RDW: 17.2 % — ABNORMAL HIGH (ref 11.5–15.5)
WBC: 11.4 10*3/uL — ABNORMAL HIGH (ref 4.0–10.5)
nRBC: 0.5 % — ABNORMAL HIGH (ref 0.0–0.2)

## 2021-03-28 LAB — THYROID PANEL WITH TSH
Free Thyroxine Index: 1.2 (ref 1.2–4.9)
T3 Uptake Ratio: 30 % (ref 24–39)
T4, Total: 4 ug/dL — ABNORMAL LOW (ref 4.5–12.0)
TSH: 0.856 u[IU]/mL (ref 0.450–4.500)

## 2021-03-28 MED ORDER — CHLORHEXIDINE GLUCONATE 0.12 % MT SOLN
15.0000 mL | Freq: Two times a day (BID) | OROMUCOSAL | Status: DC
  Administered 2021-03-29 – 2021-04-15 (×33): 15 mL via OROMUCOSAL
  Filled 2021-03-28 (×34): qty 15

## 2021-03-28 MED ORDER — NEPRO/CARBSTEADY PO LIQD
237.0000 mL | Freq: Two times a day (BID) | ORAL | Status: DC
  Administered 2021-03-29 – 2021-04-14 (×25): 237 mL via ORAL

## 2021-03-28 MED ORDER — CHLORHEXIDINE GLUCONATE CLOTH 2 % EX PADS
6.0000 | MEDICATED_PAD | Freq: Every day | CUTANEOUS | Status: DC
  Administered 2021-03-29 – 2021-04-15 (×18): 6 via TOPICAL

## 2021-03-28 MED ORDER — LIDOCAINE-PRILOCAINE 2.5-2.5 % EX CREA: 1.0000 "application " | TOPICAL_CREAM | CUTANEOUS | Status: DC | PRN

## 2021-03-28 MED ORDER — GERHARDT'S BUTT CREAM
3.0000 "application " | TOPICAL_CREAM | Freq: Three times a day (TID) | CUTANEOUS | Status: DC
  Administered 2021-03-28 – 2021-04-12 (×20): 3 via TOPICAL
  Administered 2021-04-13 – 2021-04-14 (×2): 1 via TOPICAL
  Filled 2021-03-28: qty 3

## 2021-03-28 MED ORDER — PENTAFLUOROPROP-TETRAFLUOROETH EX AERO: 1.0000 "application " | INHALATION_SPRAY | CUTANEOUS | Status: DC | PRN

## 2021-03-28 MED ORDER — POLYETHYLENE GLYCOL 3350 17 G PO PACK
17.0000 g | PACK | Freq: Every day | ORAL | Status: DC | PRN
  Administered 2021-04-08: 17 g via ORAL
  Filled 2021-03-28: qty 1

## 2021-03-28 MED ORDER — TRAMADOL HCL 50 MG PO TABS: 50.0000 mg | ORAL_TABLET | Freq: Four times a day (QID) | ORAL | 0 refills | Status: DC | PRN

## 2021-03-28 MED ORDER — LEVOTHYROXINE SODIUM 112 MCG PO TABS
112.0000 ug | ORAL_TABLET | Freq: Every day | ORAL | Status: DC
  Administered 2021-03-29 – 2021-04-15 (×18): 112 ug via ORAL
  Filled 2021-03-28 (×19): qty 1

## 2021-03-28 MED ORDER — ALBUTEROL SULFATE (2.5 MG/3ML) 0.083% IN NEBU: 3.0000 mL | INHALATION_SOLUTION | Freq: Four times a day (QID) | RESPIRATORY_TRACT | Status: DC | PRN

## 2021-03-28 MED ORDER — FAMOTIDINE 20 MG PO TABS
10.0000 mg | ORAL_TABLET | Freq: Every day | ORAL | Status: DC
  Administered 2021-03-29 – 2021-04-15 (×18): 10 mg via ORAL
  Filled 2021-03-28 (×18): qty 1

## 2021-03-28 MED ORDER — POLYETHYLENE GLYCOL 3350 17 GM/SCOOP PO POWD
17.0000 g | Freq: Every day | ORAL | 0 refills | Status: DC | PRN
  Filled 2021-03-28: qty 238, 14d supply, fill #0

## 2021-03-28 MED ORDER — MELATONIN 5 MG PO TABS
10.0000 mg | ORAL_TABLET | Freq: Every evening | ORAL | Status: DC | PRN
  Administered 2021-03-28 – 2021-04-14 (×12): 10 mg via ORAL
  Filled 2021-03-28 (×13): qty 2

## 2021-03-28 MED ORDER — HEPARIN SODIUM (PORCINE) 1000 UNIT/ML DIALYSIS
1000.0000 [IU] | INTRAMUSCULAR | Status: DC | PRN
  Administered 2021-03-29: 3200 [IU] via INTRAVENOUS_CENTRAL

## 2021-03-28 MED ORDER — METOPROLOL SUCCINATE ER 25 MG PO TB24
25.0000 mg | ORAL_TABLET | Freq: Every day | ORAL | 0 refills | Status: DC
  Filled 2021-03-28: qty 30, 30d supply, fill #0

## 2021-03-28 MED ORDER — ATORVASTATIN CALCIUM 10 MG PO TABS
20.0000 mg | ORAL_TABLET | Freq: Every day | ORAL | Status: DC
  Administered 2021-03-28 – 2021-04-14 (×18): 20 mg via ORAL
  Filled 2021-03-28 (×19): qty 2

## 2021-03-28 MED ORDER — DARBEPOETIN ALFA 25 MCG/0.42ML IJ SOSY: 25.0000 ug | PREFILLED_SYRINGE | INTRAMUSCULAR | Status: DC

## 2021-03-28 MED ORDER — SODIUM CHLORIDE 0.9 % IV SOLN: 100.0000 mL | INTRAVENOUS | Status: DC | PRN

## 2021-03-28 MED ORDER — CALCITRIOL 0.25 MCG PO CAPS
0.2500 ug | ORAL_CAPSULE | Freq: Every day | ORAL | Status: DC
  Administered 2021-03-29 – 2021-04-15 (×18): 0.25 ug via ORAL
  Filled 2021-03-28 (×18): qty 1

## 2021-03-28 MED ORDER — FOLIC ACID 1 MG PO TABS
1000.0000 ug | ORAL_TABLET | Freq: Every day | ORAL | Status: DC
  Administered 2021-03-29 – 2021-04-15 (×18): 1 mg via ORAL
  Filled 2021-03-28 (×18): qty 1

## 2021-03-28 MED ORDER — CERTAVITE/ANTIOXIDANTS PO TABS
1.0000 | ORAL_TABLET | Freq: Every day | ORAL | 0 refills | Status: DC
  Filled 2021-03-28: qty 30, 30d supply, fill #0

## 2021-03-28 MED ORDER — DARBEPOETIN ALFA 25 MCG/0.42ML IJ SOSY
100.0000 ug | PREFILLED_SYRINGE | INTRAMUSCULAR | Status: DC
  Filled 2021-03-28: qty 1.68

## 2021-03-28 MED ORDER — RENA-VITE PO TABS
1.0000 | ORAL_TABLET | Freq: Every day | ORAL | Status: DC
  Administered 2021-03-28 – 2021-04-14 (×18): 1 via ORAL
  Filled 2021-03-28 (×18): qty 1

## 2021-03-28 MED ORDER — AMLODIPINE BESYLATE 5 MG PO TABS
5.0000 mg | ORAL_TABLET | Freq: Every day | ORAL | Status: DC
  Administered 2021-03-29 – 2021-04-04 (×7): 5 mg via ORAL
  Filled 2021-03-28 (×8): qty 1

## 2021-03-28 MED ORDER — APIXABAN 5 MG PO TABS
5.0000 mg | ORAL_TABLET | Freq: Two times a day (BID) | ORAL | Status: DC
  Administered 2021-03-28 – 2021-04-15 (×36): 5 mg via ORAL
  Filled 2021-03-28 (×36): qty 1

## 2021-03-28 MED ORDER — SODIUM CHLORIDE 0.9 % IV SOLN
500.0000 mg | INTRAVENOUS | Status: AC
  Administered 2021-03-29 – 2021-03-31 (×3): 500 mg via INTRAVENOUS
  Filled 2021-03-28 (×7): qty 0.5

## 2021-03-28 MED ORDER — METOPROLOL SUCCINATE ER 25 MG PO TB24
25.0000 mg | ORAL_TABLET | Freq: Every day | ORAL | Status: DC
Start: 2021-03-29 — End: 2021-04-04
  Administered 2021-03-29 – 2021-04-04 (×7): 25 mg via ORAL
  Filled 2021-03-28 (×8): qty 1

## 2021-03-28 MED ORDER — ALTEPLASE 2 MG IJ SOLR: 2.0000 mg | Freq: Once | INTRAMUSCULAR | Status: DC | PRN

## 2021-03-28 MED ORDER — GABAPENTIN 300 MG PO CAPS
300.0000 mg | ORAL_CAPSULE | Freq: Every day | ORAL | Status: DC
  Administered 2021-03-28 – 2021-04-14 (×18): 300 mg via ORAL
  Filled 2021-03-28 (×18): qty 1

## 2021-03-28 MED ORDER — ORAL CARE MOUTH RINSE
15.0000 mL | Freq: Two times a day (BID) | OROMUCOSAL | Status: DC
  Administered 2021-03-29 – 2021-04-14 (×16): 15 mL via OROMUCOSAL

## 2021-03-28 MED ORDER — ACETAMINOPHEN 325 MG PO TABS
650.0000 mg | ORAL_TABLET | ORAL | Status: DC | PRN
  Administered 2021-04-04 – 2021-04-12 (×7): 650 mg via ORAL
  Filled 2021-03-28 (×3): qty 2

## 2021-03-28 MED ORDER — VITAMIN D (ERGOCALCIFEROL) 1.25 MG (50000 UNIT) PO CAPS
50000.0000 [IU] | ORAL_CAPSULE | ORAL | Status: DC
  Administered 2021-03-30 – 2021-04-13 (×3): 50000 [IU] via ORAL
  Filled 2021-03-28 (×3): qty 1

## 2021-03-28 MED ORDER — LIDOCAINE HCL (PF) 1 % IJ SOLN: 5.0000 mL | INTRAMUSCULAR | Status: DC | PRN

## 2021-03-28 MED ORDER — INSULIN ASPART 100 UNIT/ML IJ SOLN
0.0000 [IU] | Freq: Three times a day (TID) | INTRAMUSCULAR | Status: DC
  Administered 2021-04-03: 3 [IU] via SUBCUTANEOUS
  Administered 2021-04-03 – 2021-04-15 (×12): 2 [IU] via SUBCUTANEOUS

## 2021-03-28 MED ORDER — ACETAMINOPHEN 325 MG PO TABS
650.0000 mg | ORAL_TABLET | Freq: Four times a day (QID) | ORAL | Status: DC | PRN
  Administered 2021-04-07 – 2021-04-14 (×2): 650 mg via ORAL
  Filled 2021-03-28 (×6): qty 2

## 2021-03-28 MED ORDER — TRAMADOL HCL 50 MG PO TABS: 50.0000 mg | ORAL_TABLET | Freq: Four times a day (QID) | ORAL | Status: DC | PRN

## 2021-03-28 MED ORDER — SEVELAMER CARBONATE 800 MG PO TABS
800.0000 mg | ORAL_TABLET | Freq: Three times a day (TID) | ORAL | Status: DC
  Administered 2021-03-28 – 2021-04-15 (×50): 800 mg via ORAL
  Filled 2021-03-28 (×54): qty 1

## 2021-03-28 MED ORDER — SODIUM CHLORIDE 0.9 % IV SOLN: 500.0000 mg | INTRAVENOUS | 0 refills | Status: DC

## 2021-03-28 MED ORDER — SODIUM CHLORIDE 0.9 % IV SOLN
500.0000 mg | INTRAVENOUS | Status: DC
  Administered 2021-03-28: 500 mg via INTRAVENOUS
  Filled 2021-03-28: qty 0.5

## 2021-03-28 MED ORDER — BISACODYL 10 MG RE SUPP
10.0000 mg | Freq: Every day | RECTAL | Status: DC
  Administered 2021-04-03 – 2021-04-12 (×3): 10 mg via RECTAL
  Filled 2021-03-28 (×13): qty 1

## 2021-03-28 NOTE — Progress Notes (Signed)
Physical Therapy Treatment Patient Details Name: Nicole Schmidt MRN: [id number] DOB: 10-18-1965 Today's Date: 03/28/2021   History of Present Illness Nicole Schmidt is a 23yoF who comes to [facility] on 03/17/21 after acute increase in edema limiting ability to walk. Pt was DC to home previous day from inpatient rehab in [place]. PMH: ESRD on HD TTS, DMN, HTN, HLD, AF, PE, anemia, dCHF. Pt was at [facility] from 1/21-1/31.    PT Comments    Patient received in bed, agreeable to PT assessment after spending time in CCU over weekend due to SOB. Patient without pain or significant complaints this session. She requires min assist with bed mobility with heavy use of bed rails. Min +2 assist for sit to stand and to take a few steps at edge of bed with RW. She is fatigued with this. Patient planning to return to [facility] upon discharge for continued skilled PT.       Recommendations for follow up therapy are one component of a multi-disciplinary discharge planning process, led by the attending physician.  Recommendations may be updated based on patient status, additional functional criteria and insurance authorization.  Follow Up Recommendations  Acute inpatient rehab (3hours/day)     Assistance Recommended at Discharge Frequent or constant Supervision/Assistance  Patient can return home with the following Two people to help with walking and/or transfers;Assistance with cooking/housework;Help with stairs or ramp for entrance;Assist for transportation;Two people to help with bathing/dressing/bathroom   Equipment Recommendations  None recommended by PT    Recommendations for Other Services       Precautions / Restrictions Precautions Precautions: Fall Precaution Comments: O2 Restrictions Weight Bearing Restrictions: No     Mobility  Bed Mobility Overal bed mobility: Needs Assistance Bed Mobility: Supine to Sit, Sit to Supine     Supine to sit: Min assist, HOB elevated Sit to supine: Min assist    General bed mobility comments: Heavy use of bed rail for supine to sit. Min assist to elevate trunk and min assist to return B LEs back up to bed.    Transfers Overall transfer level: Needs assistance Equipment used: Rolling walker (2 wheels) Transfers: Sit to/from Stand Sit to Stand: Min assist, +2 physical assistance                Ambulation/Gait Ambulation/Gait assistance: Min assist, +2 physical assistance Gait Distance (Feet): 5 Feet Assistive device: Rolling walker (2 wheels) Gait Pattern/deviations: Step-to pattern, Decreased step length - right, Decreased step length - left Gait velocity: decreased     General Gait Details: Side steps at edge of bed. Min assist +2. Fatigued with this. Difficulty lifting LEs to take steps   Stairs             Wheelchair Mobility    Modified Rankin (Stroke Patients Only)       Balance Overall balance assessment: Needs assistance Sitting-balance support: Bilateral upper extremity supported Sitting balance-Leahy Scale: Fair Sitting balance - Comments: demonstrates fair static sitting balance at EOB   Standing balance support: Bilateral upper extremity supported Standing balance-Leahy Scale: Fair Standing balance comment: Able to take lateral steps at EOB with MIN GUARD (2 people on either side for safety)                            Cognition Arousal/Alertness: Awake/alert Behavior During Therapy: WFL for tasks assessed/performed Overall Cognitive Status: Within Functional Limits for tasks assessed  General Comments: Pleasant, conversational, A&Ox4. Endorsing fatigue        Exercises Other Exercises Other Exercises: Seated LAQ, Seated marching 1x10 bilat, STS x 3 reps    General Comments        Pertinent Vitals/Pain Pain Assessment Pain Assessment: No/denies pain    Home Living                          Prior Function            PT  Goals (current goals can now be found in the care plan section) Acute Rehab PT Goals Patient Stated Goal: to get better, be more independent PT Goal Formulation: With patient Time For Goal Achievement: 04/04/21 Potential to Achieve Goals: Fair Progress towards PT goals: Progressing toward goals    Frequency    Min 2X/week      PT Plan Current plan remains appropriate    Co-evaluation PT/OT/SLP Co-Evaluation/Treatment: Yes Reason for Co-Treatment: For patient/therapist safety;To address functional/ADL transfers PT goals addressed during session: Mobility/safety with mobility;Balance;Proper use of DME        AM-PAC PT "6 Clicks" Mobility   Outcome Measure  Help needed turning from your back to your side while in a flat bed without using bedrails?: A Lot Help needed moving from lying on your back to sitting on the side of a flat bed without using bedrails?: A Lot Help needed moving to and from a bed to a chair (including a wheelchair)?: A Lot Help needed standing up from a chair using your arms (e.g., wheelchair or bedside chair)?: A Lot Help needed to walk in hospital room?: Total Help needed climbing 3-5 steps with a railing? : Total 6 Click Score: 10    End of Session Equipment Utilized During Treatment: Oxygen Activity Tolerance: Patient limited by fatigue Patient left: in bed;with call bell/phone within reach Nurse Communication: Mobility status PT Visit Diagnosis: Muscle weakness (generalized) (M62.81);Difficulty in walking, not elsewhere classified (R26.2);Other abnormalities of gait and mobility (R26.89);Unsteadiness on feet (R26.81)     Time: 8882-8003 PT Time Calculation (min) (ACUTE ONLY): 23 min  Charges:  $Therapeutic Activity: 8-22 mins                     {Oreta Soloway, PT, GCS 03/28/21,10:27 AM

## 2021-03-28 NOTE — Progress Notes (Signed)
Inpatient Rehab Admissions Coordinator:   Following for my colleague, Danne Baxter.  Note PT and OT both signed off for change in status over the weekend.  Will need PT/OT re-eval to ascertain whether pt remains appropriate for CIR.    Shann Medal, PT, DPT Admissions Coordinator [phone] 03/28/21  8:49 AM

## 2021-03-28 NOTE — Progress Notes (Signed)
°   03/28/21 1130  Clinical Encounter Type  Visited With Patient and family together  Visit Type Initial;Other (Comment) (Advance Directive)   Chaplain provided information for Advance Directive. Upon review of the document with patient and family, they realized that what they wanted was estate planning, which we do not offer.

## 2021-03-28 NOTE — Progress Notes (Addendum)
Consent for dialysis in chart  Pt arrived to unit via EMS, pt is alert and oriented, able to make needs known, O2 4L via n/c, MASD to buttocks that is blanchable, unable to locate wound to right knee that was skin tear, removed from EMR. Oriented to rehab and went over education book.

## 2021-03-28 NOTE — Progress Notes (Addendum)
PMR Admission Coordinator Pre-Admission Assessment   Patient: Nicole Schmidt is an 56 y.o., female MRN: [id number] DOB: Apr 27, 1965 Height: 5\' 4"  (162.6 cm) Weight: (!) 161.1 kg   Insurance Information HMO:     PPO:      PCP:      IPA:      80/20:      OTHER:  PRIMARY: [employer]       Policy#: [id number]      Subscriber: pt CM Name: approved via fax      Phone#: [phone]     Fax#: [phone] Pre-Cert#: [id number]  approved for 7 days    Employer: 2[employer] Benefits:  Phone #: [phone]     Name: 2/8 Eff. Date: 02/18/19     Deduct: $250      Out of Pocket Max: $2000      Life Max: none CIR: 80%      SNF: 80% Outpatient: 80%     Co-Pay: limited to 18 combined visits per year Home Health: 80%      Co-Pay: 20% DME: 80%     Co-Pay: 20% Providers: in network  SECONDARY: none      Policy#:      Phone#:    Development worker, community:       Phone#:    The Actuary for patients in Inpatient Rehabilitation Facilities with attached Privacy Act [place] Records was provided and verbally reviewed with: N/A   Emergency Contact Information Contact Information       Name Relation Home Work Mobile    Eudy,Susan Relative [phone]   [phone]    Bowman,Connie Relative [phone]        cates,christine Denman George     [phone]         Current Medical History  Patient Admitting Diagnosis: Debility   History of Present Illness:  Nicole Schmidt is a 56 year old right-handed female with history of chronic hypoxemic respiratory failure on 2 L oxygen, diabetes mellitus, breast cancer, history of hematuria left ureteral stone with recent left uteroscopy with laser lithotripsy and stent exchange per Dr. Diamantina Providence, hypertension, hyperlipidemia, gout, morbid obesity with BMI 62.17, atrial fibrillation/pulmonary emboli on Eliquis.  Recent hospital admission 02/01/2021 - 02/22/2021 for management of acute on chronic diastolic congestive heart failure and end-stage  renal disease with hemodialysis with permacath placed 02/25/2021.  Patient with CIR admit 03/04/2021 to 03/16/2021 for debility related to chronic renal failure superimposed with diastolic congestive heart failure.  She was discharged to home from CIR modified independent with a cane.  Per chart review patient lives alone.  1 level home 2 steps to entry.  Reports independence in self-care and independent ADL tasks at baseline.  [facility] with inability to ambulate and generalized lower extremity swelling as well as hypotension..  Patient became more somnolent unable to follow commands ABG showed pH 7.14/PCO2 89/bicarb 30.3 and patient placed on BiPAP.  Admission chemistry sodium 132 chloride 96 glucose 119 BUN 15 creatinine 1.99, hemoglobin 8.3, WBC 13,000, urinalysis negative nitrite, troponin negative, BNP 576, lactic acid 0.7.  Chest x-ray 03/20/2021 showed bilateral pulmonary opacities that were nonspecific.  Cranial CT scan negative for acute changes.  Renal service follow-up with hemodialysis ongoing.  Patient did remain on chronic Eliquis as prior to admission.  Patient continuing oxygen therapy as directed for history of chronic hypoxemic respiratory failure with follow-up chest x-ray 03/22/2021 stable with mild bilateral pulmonary edema.  Acute on chronic anemia latest hemoglobin 8.7- 9.1 noted leukocytosis  [id number] and monitored with fall per infectious disease currently maintained on IV Invanz x 4doses.  Urine culture positive for ESBL Proteus and E. coli treated empirically with 1 dose of fosfomycin 2/9.  Follow-up ultrasound showed resolution of left hydronephrosis.  Therapy evaluations completed due to patient decreased functional mobility was recommended for a comprehensive rehab program.   Patient's medical record from [facility] has been reviewed by the rehabilitation admission coordinator and physician.   Past Medical History      Past Medical History:  Diagnosis Date    (HFpEF) heart failure with preserved ejection fraction ([place])      a. 10/2018 Echo: EF 60-65%, diast dysfxn, RVSP 50.7 mmHg, mildly dil LA.   Acquired hypothyroidism 02/09/2020   Anemia     Arthritis     Breast cancer ([place]) 2014    right breast cancer   Breast cancer of upper-inner quadrant of right female breast ([place])      Right breast invasive CA and DCIS , 7 mm T1,N0,M0. Er/PR pos, her 2 negative.  Margins 1 mm.   CHF (congestive heart failure) (HCC)     CKD (chronic kidney disease), stage III (HCC)     Diabetes mellitus type 2 in obese ([place]) 02/09/2020   Diabetes mellitus without complication ([place])     Dyslipidemia 02/09/2020   Essential hypertension     Gout     Hypothyroidism     Menopause      age 64   Morbid obesity ([place])     Persistent atrial fibrillation ([place])      a. Dx 10/2018 in setting of PNA. CHA2DS2VASc = 4-->Eliquis; b. 11/2018 s/p successful DCCV.   Personal history of radiation therapy     Pulmonary embolism ([place]) 10/2014    a. Chronic eliquis.   Thyroid goiter      Has the patient had major surgery during 100 days prior to admission? No   Family History   family history includes Atrial fibrillation in her mother; Colon cancer (age of onset: 54) in her cousin; Diabetes in her father; Heart attack in her mother; Heart failure in her mother; Hypertension in her father and mother; Parkinson's disease in her father; Rheum arthritis in her mother.   Current Medications   Current Facility-Administered Medications:    0.9 %  sodium chloride infusion, 250 mL, Intravenous, PRN, Flora Lipps, MD, Stopped at 03/20/21 1758   acetaminophen (TYLENOL) tablet 650 mg, 650 mg, Oral, Q6H PRN, Rada Hay, MD, 650 mg at 03/20/21 1946   acetaminophen (TYLENOL) tablet 650 mg, 650 mg, Oral, Q4H PRN, Mortimer Fries, Kurian, MD   albuterol (PROVENTIL) (2.5 MG/3ML) 0.083% nebulizer solution 3 mL, 3 mL, Inhalation, Q6H PRN, Rada Hay, MD, 3 mL at 03/28/21 1012   amLODipine (NORVASC)  tablet 5 mg, 5 mg, Oral, Daily, Rada Hay, MD, 5 mg at 03/28/21 9629   apixaban (ELIQUIS) tablet 5 mg, 5 mg, Oral, BID, Benita Gutter, RPH, 5 mg at 03/28/21 0840   atorvastatin (LIPITOR) tablet 20 mg, 20 mg, Oral, QHS, Rada Hay, MD, 20 mg at 03/27/21 2250   bisacodyl (DULCOLAX) suppository 10 mg, 10 mg, Rectal, Daily, Fritzi Mandes, MD, 10 mg at 03/26/21 5284   calcitRIOL (ROCALTROL) capsule 0.25 mcg, 0.25 mcg, Oral, Daily, Rada Hay, MD, 0.25 mcg at 03/27/21 1015   chlorhexidine (PERIDEX) 0.12 % solution 15 mL, 15 mL, Mouth Rinse, BID, Kasa, Kurian, MD, 15 mL at 03/28/21 0840   Chlorhexidine Gluconate Cloth 2 %  PADS 6 each, 6 each, Topical, q morning, Richarda Osmond, MD, 6 each at 03/27/21 1500   epoetin alfa (EPOGEN) injection 10,000 Units, 10,000 Units, Intravenous, Q T,Th,Sa-HD, Kolluru, Sarath, MD, 10,000 Units at 03/26/21 1621   ertapenem (INVANZ) 500 mg in sodium chloride 0.9 % 50 mL IVPB, 500 mg, Intravenous, Q24H, Posey Pronto, Sona, MD   famotidine (PEPCID) tablet 10 mg, 10 mg, Oral, Daily, Benita Gutter, RPH, 10 mg at 03/28/21 1610   feeding supplement (NEPRO CARB STEADY) liquid 237 mL, 237 mL, Oral, BID BM, Ottie Glazier, MD, Rate Change at 96/04/54 0981   folic acid (FOLVITE) tablet 1 mg, 1,000 mcg, Oral, Daily, Rada Hay, MD, 1 mg at 03/28/21 0840   gabapentin (NEURONTIN) capsule 300 mg, 300 mg, Oral, QHS, Rada Hay, MD, 300 mg at 03/27/21 2250   Gerhardt's butt cream 3 application, 3 application, Topical, TID, Rada Hay, MD, 3 application at 19/14/78 0554   levothyroxine (SYNTHROID) tablet 112 mcg, 112 mcg, Oral, QAC breakfast, Rada Hay, MD, 112 mcg at 03/28/21 0555   MEDLINE mouth rinse, 15 mL, Mouth Rinse, q12n4p, Kasa, Kurian, MD, 15 mL at 03/27/21 1335   melatonin tablet 10 mg, 10 mg, Oral, QHS PRN, Rada Hay, MD, 10 mg at 03/27/21 2313   metoprolol succinate (TOPROL-XL) 24 hr tablet 25 mg, 25 mg, Oral,  Daily, Fritzi Mandes, MD, 25 mg at 03/28/21 0840   multivitamin (RENA-VIT) tablet 1 tablet, 1 tablet, Oral, QHS, Aleskerov, Fuad, MD, 1 tablet at 03/27/21 2250   ondansetron (ZOFRAN) injection 4 mg, 4 mg, Intravenous, Q6H PRN, Mortimer Fries, Kurian, MD   polyethylene glycol (MIRALAX / GLYCOLAX) packet 17 g, 17 g, Oral, Daily PRN, Mortimer Fries, Maretta Bees, MD, 17 g at 03/23/21 2956   sevelamer carbonate (RENVELA) tablet 800 mg, 800 mg, Oral, TID WC, Rada Hay, MD, 800 mg at 03/28/21 0839   sodium chloride flush (NS) 0.9 % injection 3 mL, 3 mL, Intravenous, Q12H, Kasa, Kurian, MD, 3 mL at 03/27/21 2132   sodium chloride flush (NS) 0.9 % injection 3 mL, 3 mL, Intravenous, PRN, Flora Lipps, MD   traMADol (ULTRAM) tablet 50 mg, 50 mg, Oral, Q6H PRN, Rada Hay, MD, 50 mg at 03/24/21 1757   Vitamin D (Ergocalciferol) (DRISDOL) capsule 50,000 Units, 50,000 Units, Oral, Weekly, Rada Hay, MD, 50,000 Units at 03/23/21 0813   Patients Current Diet:  Diet Order                  Diet - low sodium heart healthy             Diet renal with fluid restriction Fluid restriction: 1200 mL Fluid; Room service appropriate? Yes with Assist; Fluid consistency: Thin  Diet effective now                       Precautions / Restrictions Precautions Precautions: Fall Precaution Comments: O2 Restrictions Weight Bearing Restrictions: No RLE Weight Bearing: Partial weight bearing LLE Weight Bearing: Partial weight bearing    Has the patient had 2 or more falls or a fall with injury in the past year? No   Prior Activity Level Mod I with Cane with discharge from [facility] CIR 03/16/21   Prior Functional Level Self Care: Did the patient need help bathing, dressing, using the toilet or eating? Independent   Indoor Mobility: Did the patient need assistance with walking from room to room (with or without device)? Independent  Stairs: Did the patient need assistance with internal or external stairs (with or  without device)? Independent   Functional Cognition: Did the patient need help planning regular tasks such as shopping or remembering to take medications? Independent   Patient Information Are you of Hispanic, Latino/a,or Spanish origin?: A. No, not of Hispanic, Latino/a, or Spanish origin What is your race?: A. White Do you need or want an interpreter to communicate with a doctor or health care staff?: 0. No   Patient's Response To:  Health Literacy and Transportation Is the patient able to respond to health literacy and transportation needs?: Yes Health Literacy - How often do you need to have someone help you when you read instructions, pamphlets, or other written material from your doctor or pharmacy?: Never In the past 12 months, has lack of transportation kept you from medical appointments or from getting medications?: No In the past 12 months, has lack of transportation kept you from meetings, work, or from getting things needed for daily living?: No   Home Assistive Devices / [place] Devices/Equipment: [place]: Radio producer - single point, Rollator (4 wheels), Conservation officer, nature (2 wheels), BSC/3in1, Wheelchair - manual, Shower seat   Prior Device Use: Indicate devices/aids used by the patient prior to current illness, exacerbation or injury?  cane   Current Functional Level Cognition   Arousal/Alertness: Awake/alert Overall Cognitive Status: Within Functional Limits for tasks assessed Orientation Level: Oriented X4 General Comments: Pleasant, conversational, A&Ox4. Endorsing fatigue    Extremity Assessment (includes Sensation/Coordination)   Upper Extremity Assessment: Generalized weakness  Lower Extremity Assessment: Generalized weakness LLE: Unable to fully assess due to pain LLE Coordination: decreased gross motor     ADLs   Overall ADL's : Needs assistance/impaired Grooming: Wash/dry face, Oral care, Set up, Bed level Grooming Details (indicate  cue type and reason): Performs oral care and face washing with bed in chair position. Lower Body Dressing: Maximal assistance, Sitting/lateral leans Lower Body Dressing Details (indicate cue type and reason): to don socks while seated EOB Functional mobility during ADLs: +2 for physical assistance, Moderate assistance General ADL Comments: Pt presents endorsing generalized fatigue from current health concerns and recent PT session. She is agreeable to bed-level activity and requires SET UP assist for UB grooming tasks.     Mobility   Overal bed mobility: Needs Assistance Bed Mobility: Supine to Sit, Sit to Supine Rolling: Max assist Supine to sit: Min assist, HOB elevated Sit to supine: Min assist General bed mobility comments: Heavy use of bed rail for supine to sit. Min assist to elevate trunk and min assist to return B LEs back up to bed.     Transfers   Overall transfer level: Needs assistance Equipment used: Rolling walker (2 wheels) Transfers: Sit to/from Stand Sit to Stand: Min assist, +2 physical assistance General transfer comment: patient able to stand x 2 from elevated bed with +2 assist and rocking 3x for momentum     Ambulation / Gait / Stairs / Wheelchair Mobility   Ambulation/Gait Ambulation/Gait assistance: Min assist, +2 physical assistance Gait Distance (Feet): 5 Feet Assistive device: Rolling walker (2 wheels) Gait Pattern/deviations: Step-to pattern, Decreased step length - right, Decreased step length - left General Gait Details: Side steps at edge of bed. Min assist +2. Fatigued with this. Difficulty lifting LEs to take steps Gait velocity: decreased     Posture / Balance Dynamic Sitting Balance Sitting balance - Comments: demonstrates fair static sitting balance at EOB Balance Overall balance  assessment: Needs assistance Sitting-balance support: Bilateral upper extremity supported Sitting balance-Leahy Scale: Fair Sitting balance - Comments: demonstrates  fair static sitting balance at EOB Standing balance support: Bilateral upper extremity supported Standing balance-Leahy Scale: Fair Standing balance comment: Able to take lateral steps at EOB with MIN GUARD (2 people on either side for safety)     Special needs/care consideration OP dialysis T Th Sat    Previous Home Environment  Living Arrangements: Alone  Lives With: Alone Available Help at Discharge: Friend(s), Available PRN/intermittently Type of Home: House Home Layout: One level Home Access: Stairs to enter Entrance Stairs-Rails: Right, Left, Can reach both Entrance Stairs-Number of Steps: 2 steps in front with no rails and 4 steps in the back with 2 rails - plan to get railings installed on front steps (months back using back door consistently due to need for rails) Bathroom Shower/Tub: Multimedia programmer: Handicapped height Bathroom Accessibility: Yes How Accessible: Accessible via walker Home Care Services: No Additional Comments: Pt currently lives in her mother's old home that is 1 level with level entry, but planning to move to a new house as soon as possible that's 1 level with 4 steps to enter with B rails. She does not want to return to mother's home as she believe it has mold.   Discharge Living Setting Plans for Discharge Living Setting: Patient's home Type of Home at Discharge: House Discharge Home Layout: One level Discharge Home Access: Stairs to enter Entrance Stairs-Rails: None Entrance Stairs-Number of Steps: Front has 2 steps no rails; Back has 4 steps with rials in the dorr to pull up on Discharge Bathroom Shower/Tub: Walk-in shower Discharge Bathroom Toilet: Handicapped height Discharge Bathroom Accessibility: Yes How Accessible: Accessible via walker Does the patient have any problems obtaining your medications?: No   Home Health to be with [facility] on 2/3 per [facility] CM/ Patient's home address is [place]. I have spoken with she  and her cousin by phon eon 2/9 to request they make contacts to have ramp built and to arrange transportation for outpatient dialysis prior to discharge form CIR   Social/Family/Support Systems Contact Information: Manuela Schwartz, cousin Anticipated Caregiver: Susie Anticipated Caregiver's Contact Information: see contacts Ability/Limitations of Caregiver: cousin intermittent Caregiver Availability: Intermittent Discharge Plan Discussed with Primary Caregiver: Yes Is Caregiver In Agreement with Plan?: Yes Does Caregiver/Family have Issues with Lodging/Transportation while Pt is in Rehab?: No   Goals Patient/Family Goal for Rehab: Mod I with PT and OT Expected length of stay: ELOS 7 to 10 days Additional Information: discussed on 2/9 the need for patient and her cousin to have a ramp built and to arrange [facility] transportation to her op dialysis treatments Pt/Family Agrees to Admission and willing to participate: Yes Program Orientation Provided & Reviewed with Pt/Caregiver Including Roles  & Responsibilities: Yes  Barriers to Discharge: Decreased caregiver support, Inaccessible home environment   Decrease burden of Care through IP rehab admission: n/a   Possible need for SNF placement upon discharge: not anticipated   Patient Condition: I have reviewed medical records from [facility], spoken with CM, and patient. I discussed via phone for inpatient rehabilitation assessment.  Patient will benefit from ongoing PT and OT, can actively participate in 3 hours of therapy a day 5 days of the week, and can make measurable gains during the admission.  Patient will also benefit from the coordinated team approach during an Inpatient Acute Rehabilitation admission.  The patient will receive intensive therapy as well as  Rehabilitation physician, nursing, social worker, and care management interventions.  Due to bladder management, bowel management, safety, skin/wound care, disease management, medication administration,  pain management, and patient education the patient requires 24 hour a day rehabilitation nursing.  The patient is currently mod assist overall with mobility and basic ADLs.  Discharge setting and therapy post discharge at home with home health is anticipated.  Patient has agreed to participate in the Acute Inpatient Rehabilitation Program and will admit today.   Preadmission Screen Completed By: Danne Baxter RN MSN with updates by Michel Santee, 03/28/2021 10:45 AM ______________________________________________________________________   Discussed status with Dr. Letta Pate on 03/28/21  at 10:46 AM  and received approval for admission today.   Admission Coordinator: Danne Baxter RN MSN with updates by Michel Santee, PT, time 10:46 AM Sudie Grumbling 03/28/21     Assessment/Plan: Diagnosis: Debility secondary to respiratory failure Does the need for close, 24 hr/day Medical supervision in concert with the patient's rehab needs make it unreasonable for this patient to be served in a less intensive setting? Yes Co-Morbidities requiring supervision/potential complications: End-stage renal disease on hemodialysis, A-fib on Eliquis, beta-blocker for rate control, history of pulmonary edema, continue Eliquis, history of CHF monitor input output breathing and lower extremity swelling. Due to bladder management, bowel management, safety, skin/wound care, disease management, medication administration, pain management, and patient education, does the patient require 24 hr/day rehab nursing? Yes Does the patient require coordinated care of a physician, rehab nurse, PT, OT, and SLP to address physical and functional deficits in the context of the above medical diagnosis(es)? Yes Addressing deficits in the following areas: balance, endurance, locomotion, strength, transferring, bowel/bladder control, bathing, dressing, feeding, grooming, toileting, cognition, and psychosocial support Can the patient actively  participate in an intensive therapy program of at least 3 hrs of therapy 5 days a week? Yes The potential for patient to make measurable gains while on inpatient rehab is good Anticipated functional outcomes upon discharge from inpatient rehab: modified independent PT, modified independent OT, n/a SLP Estimated rehab length of stay to reach the above functional goals is: 7 to 10 days Anticipated discharge destination: Home 10. Overall Rehab/Functional Prognosis: good     MD Signature: Charlett Blake M.D. [facility] Fellow Am Acad of Phys Med and Rehab Diplomate Am Board of Electrodiagnostic Med Fellow Am Board of Interventional Pain

## 2021-03-28 NOTE — Consult Note (Signed)
[facility] Renal Consultation Note    Indication for Consultation:  Management of ESRD/hemodialysis; anemia, hypertension/volume and secondary hyperparathyroidism  OEV:[id number], Lourdes Sledge, MD  HPI: Nicole Schmidt is a 56 y.o. female. ESRD recent new start on HD TTS at [place], with 1st dialysis on 02/25/21 at [facility].  Past medical history significant for anemia, breast cancer, CKD, DM2, HTN, gout, obesity, hx PE, atrial fibrillation on eliquis.    Patient with recent CIR admission from 1/20-03/16/21 for debility following back to back hospitalization most recently from 1/11-1/20 for acute on chronic respiratory failure, acute on chronic CHF and progression of CKD to ESRD needing dialysis.  Patient readmitted to [facility] on [facility]21 for AoC CHF & respiratory failure as well as L ureteral stone s/p stent placement and ESBL UTI on ABX.  She presented to the ED on 2/2 due to inability to ambulate up and down her steps and worsened generalized swelling and weakness.  Patient has been transferred to [facility] for inpatient rehab.    Patient seen and examined at bedside.  Reports improvement in breathing and edema with HD.  States issues pulling fluid due to hypotension but able to remove 3L on Saturday.  Denies CP, abdominal pain, nausea, vomiting and diarrhea.  Reports biggest concern is falling asleep without her BiPAP and not waking up. Patient and family report 2 episodes during last admission that resulted in her being "unresponsive due to build up of CO2" which improved with BiPAP use.    Past Medical History:  Diagnosis Date   (HFpEF) heart failure with preserved ejection fraction ([place])    a. 10/2018 Echo: EF 60-65%, diast dysfxn, RVSP 50.7 mmHg, mildly dil LA.   Acquired hypothyroidism 02/09/2020   Anemia    Arthritis    Breast cancer ([place]) 2014   right breast cancer   Breast cancer of upper-inner quadrant of right female breast ([place])    Right breast invasive CA and DCIS , 7 mm  T1,N0,M0. Er/PR pos, her 2 negative.  Margins 1 mm.   CHF (congestive heart failure) (HCC)    CKD (chronic kidney disease), stage III (HCC)    Diabetes mellitus type 2 in obese ([place]) 02/09/2020   Diabetes mellitus without complication ([place])    Dyslipidemia 02/09/2020   Essential hypertension    Gout    Hypothyroidism    Menopause    age 27   Morbid obesity ([place])    Persistent atrial fibrillation ([place])    a. Dx 10/2018 in setting of PNA. CHA2DS2VASc = 4-->Eliquis; b. 11/2018 s/p successful DCCV.   Personal history of radiation therapy    Pulmonary embolism ([place]) 10/2014   a. Chronic eliquis.   Thyroid goiter    Past Surgical History:  Procedure Laterality Date   BREAST BIOPSY Right 2014   breast ca   BREAST EXCISIONAL BIOPSY Right 07/19/2012   breast ca   BREAST SURGERY Right 2014   with sentinel node bx subareaolar duct excision   CARDIOVERSION N/A 11/28/2018   Procedure: CARDIOVERSION;  Surgeon: Minna Merritts, MD;  Location: [facility] ORS;  Service: Cardiovascular;  Laterality: N/A;   COLONOSCOPY WITH PROPOFOL N/A 01/30/2017   Procedure: COLONOSCOPY WITH PROPOFOL;  Surgeon: Christene Lye, MD;  Location: [facility] ENDOSCOPY;  Service: Endoscopy;  Laterality: N/A;   CYSTOSCOPY W/ URETERAL STENT PLACEMENT Left 02/14/2021   Procedure: CYSTOSCOPY WITH RETROGRADE PYELOGRAM/URETERAL STENT PLACEMENT;  Surgeon: Billey Co, MD;  Location: [facility] ORS;  Service: Urology;  Laterality: Left;  DIALYSIS/PERMA CATHETER INSERTION N/A 02/24/2021   Procedure: DIALYSIS/PERMA CATHETER INSERTION;  Surgeon: Algernon Huxley, MD;  Location: [facility] [facility];  Service: Cardiovascular;  Laterality: N/A;   Family History  Problem Relation Age of Onset   Diabetes Father    Hypertension Father    Parkinson's disease Father    Hypertension Mother    Rheum arthritis Mother    Atrial fibrillation Mother    Heart failure Mother    Heart attack Mother        died in her mid-70's.   Colon cancer  Cousin 65   Breast cancer Neg Hx    Social History:  reports that she has never smoked. She has never used smokeless tobacco. She reports that she does not drink alcohol and does not use drugs. No Known Allergies Prior to Admission medications   Medication Sig Start Date End Date Taking? Authorizing Provider  acetaminophen (TYLENOL) 325 MG tablet Take 2 tablets (650 mg total) by mouth every 6 (six) hours as needed for mild pain (or Fever >/= 101). 03/15/21   Angiulli, Lavon Paganini, PA-C  albuterol (VENTOLIN HFA) 108 (90 Base) MCG/ACT inhaler Inhale 2 puffs into the lungs every 6 (six) hours as needed for wheezing or shortness of breath. 03/15/21   Angiulli, Lavon Paganini, PA-C  amLODipine (NORVASC) 5 MG tablet Take 1 tablet (5 mg total) by mouth daily. 03/16/21   Janalee Dane, PA-C  apixaban (ELIQUIS) 5 MG TABS tablet Take 1 tablet (5 mg total) by mouth 2 (two) times daily. 03/15/21   Angiulli, Lavon Paganini, PA-C  atorvastatin (LIPITOR) 20 MG tablet Take 1 tablet (20 mg total) by mouth at bedtime. 03/15/21   Angiulli, Lavon Paganini, PA-C  calcitRIOL (ROCALTROL) 0.25 MCG capsule Take 1 capsule (0.25 mcg total) by mouth daily. 03/15/21   Angiulli, Lavon Paganini, PA-C  calcium carbonate (OSCAL) 1500 (600 Ca) MG TABS tablet Take 1 tablet (1,500 mg total) by mouth daily. 03/15/21   Angiulli, Lavon Paganini, PA-C  ergocalciferol (VITAMIN D2) 1.25 MG (50000 UT) capsule Take 1 capsule (50,000 Units total) by mouth once a week. 03/15/21   Angiulli, Lavon Paganini, PA-C  ertapenem 500 mg in sodium chloride 0.9 % 50 mL Inject 500 mg into the vein daily for 2 days. 03/29/21 03/31/21  Fritzi Mandes, MD  folic acid (FOLVITE) 1 MG tablet Take 1 tablet (1 mg total) by mouth daily. 03/15/21   Angiulli, Lavon Paganini, PA-C  gabapentin (NEURONTIN) 300 MG capsule Take 1 capsule (300 mg total) by mouth at bedtime. 03/15/21   Angiulli, Lavon Paganini, PA-C  levothyroxine (SYNTHROID) 112 MCG tablet Take 1 tablet (112 mcg total) by mouth daily before breakfast. 03/15/21    Angiulli, Lavon Paganini, PA-C  melatonin 5 MG TABS Take 2 tablets (10 mg total) by mouth at bedtime as needed (sleep). 03/15/21   Angiulli, Lavon Paganini, PA-C  metoprolol succinate (TOPROL-XL) 25 MG 24 hr tablet Take 1 tablet (25 mg total) by mouth daily. 03/29/21   Fritzi Mandes, MD  Multiple Vitamins-Minerals (CERTAVITE/ANTIOXIDANTS) TABS Take 1 tablet by mouth at bedtime. 03/28/21   Fritzi Mandes, MD  Nystatin (GERHARDT'S BUTT CREAM) CREA Apply 3 application topically 3 (three) times daily. 03/15/21   Angiulli, Lavon Paganini, PA-C  polyethylene glycol powder (GLYCOLAX/MIRALAX) 17 GM/SCOOP powder Take 1 capful (17 g) with water by mouth daily as needed for moderate constipation. 03/28/21   Fritzi Mandes, MD  sevelamer carbonate (RENVELA) 800 MG tablet Take 1 tablet (800 mg total) by mouth  3 (three) times daily with meals. 03/15/21   Angiulli, Lavon Paganini, PA-C  traMADol (ULTRAM) 50 MG tablet Take 1 tablet (50 mg total) by mouth every 6 (six) hours as needed for moderate pain. 03/28/21   Fritzi Mandes, MD  fluticasone-salmeterol (ADVAIR HFA) [phone] MCG/ACT inhaler Inhale 2 puffs into the lungs 2 (two) times daily as needed.  Patient not taking: No sig reported  02/09/20  [provider]   Current Facility-Administered Medications  Medication Dose Route Frequency Provider Last Rate Last Admin   0.9 %  sodium chloride infusion  100 mL Intravenous PRN Breeze, Shantelle, NP       0.9 %  sodium chloride infusion  100 mL Intravenous PRN Colon Flattery, NP       acetaminophen (TYLENOL) tablet 650 mg  650 mg Oral Q6H PRN Angiulli, Lavon Paganini, PA-C       acetaminophen (TYLENOL) tablet 650 mg  650 mg Oral Q4H PRN Angiulli, Lavon Paganini, PA-C       albuterol (PROVENTIL) (2.5 MG/3ML) 0.083% nebulizer solution 3 mL  3 mL Inhalation Q6H PRN Angiulli, Lavon Paganini, PA-C       alteplase (CATHFLO ACTIVASE) injection 2 mg  2 mg Intracatheter Once PRN Colon Flattery, NP       [START ON 03/29/2021] amLODipine (NORVASC) tablet 5 mg  5 mg Oral  Daily Angiulli, Lavon Paganini, PA-C       apixaban (ELIQUIS) tablet 5 mg  5 mg Oral BID Angiulli, Lavon Paganini, PA-C       atorvastatin (LIPITOR) tablet 20 mg  20 mg Oral QHS Cathlyn Parsons, PA-C       [START ON 03/29/2021] bisacodyl (DULCOLAX) suppository 10 mg  10 mg Rectal Daily Cathlyn Parsons, PA-C       [START ON 03/29/2021] calcitRIOL (ROCALTROL) capsule 0.25 mcg  0.25 mcg Oral Daily Cathlyn Parsons, PA-C       [START ON 03/29/2021] Darbepoetin Alfa (ARANESP) injection 25 mcg  25 mcg Intravenous Q Tue-HD Angiulli, Lavon Paganini, PA-C       [START ON 03/29/2021] ertapenem (INVANZ) 500 mg in sodium chloride 0.9 % 50 mL IVPB  500 mg Intravenous Q24H Cathlyn Parsons, PA-C       [START ON 03/29/2021] famotidine (PEPCID) tablet 10 mg  10 mg Oral Daily Cathlyn Parsons, PA-C       [START ON 03/29/2021] feeding supplement (NEPRO CARB STEADY) liquid 237 mL  237 mL Oral BID BM Angiulli, Lavon Paganini, PA-C       [START ON 4/40/1027] folic acid (FOLVITE) tablet 1 mg  1,000 mcg Oral Daily Angiulli, Daniel J, PA-C       gabapentin (NEURONTIN) capsule 300 mg  300 mg Oral QHS Angiulli, Lavon Paganini, PA-C       Gerhardt's butt cream 3 application  3 application Topical TID Angiulli, Lavon Paganini, PA-C       heparin injection 1,000 Units  1,000 Units Dialysis PRN Colon Flattery, NP       insulin aspart (novoLOG) injection 0-15 Units  0-15 Units Subcutaneous TID WC Angiulli, Lavon Paganini, PA-C       [START ON 03/29/2021] levothyroxine (SYNTHROID) tablet 112 mcg  112 mcg Oral QAC breakfast Angiulli, Daniel J, PA-C       lidocaine (PF) (XYLOCAINE) 1 % injection 5 mL  5 mL Intradermal PRN Colon Flattery, NP       lidocaine-prilocaine (EMLA) cream 1 application  1 application Topical PRN Colon Flattery, NP  melatonin tablet 10 mg  10 mg Oral QHS PRN Cathlyn Parsons, PA-C       [START ON 03/29/2021] metoprolol succinate (TOPROL-XL) 24 hr tablet 25 mg  25 mg Oral Daily Angiulli, Lavon Paganini, PA-C       multivitamin  (RENA-VIT) tablet 1 tablet  1 tablet Oral QHS Angiulli, Lavon Paganini, PA-C       pentafluoroprop-tetrafluoroeth (GEBAUERS) aerosol 1 application  1 application Topical PRN Colon Flattery, NP       polyethylene glycol (MIRALAX / GLYCOLAX) packet 17 g  17 g Oral Daily PRN Angiulli, Lavon Paganini, PA-C       sevelamer carbonate (RENVELA) tablet 800 mg  800 mg Oral TID WC Angiulli, Lavon Paganini, PA-C       traMADol Veatrice Bourbon) tablet 50 mg  50 mg Oral Q6H PRN Cathlyn Parsons, PA-C       [START ON 03/30/2021] Vitamin D (Ergocalciferol) (DRISDOL) capsule 50,000 Units  50,000 Units Oral Weekly Cathlyn Parsons, PA-C       Labs: Basic Metabolic Panel: Recent Labs  Lab 03/23/21 0524 03/24/21 0316 03/26/21 0153 03/27/21 0215 03/28/21 0920  NA 134*  --  131*  --  130*  K 3.7  --  4.7  --  3.7  CL 99  --  94*  --  95*  CO2 27  --  26  --  27  GLUCOSE 107*  --  98  --  125*  BUN 36*  --  46*  --  48*  CREATININE 3.35*  --  4.91*  --  5.32*  CALCIUM 8.7*  --  9.2  --  9.4  PHOS 3.9   < > 6.5* 3.2 4.9*   < > = values in this interval not displayed.   Liver Function Tests: Recent Labs  Lab 03/23/21 0524 03/26/21 0153 03/28/21 0920  AST  --  47*  --   ALT  --  56*  --   ALKPHOS  --  84  --   BILITOT  --  0.7  --   PROT  --  6.6  --   ALBUMIN 2.5* 2.6* 2.8*   CBC: Recent Labs  Lab 03/22/21 0417 03/24/21 1200 03/25/21 1026 03/27/21 0215 03/28/21 0920  WBC 13.2* 22.3* 18.8* 12.2* 11.4*  NEUTROABS 10.2*  --   --   --   --   HGB 7.5* 9.1* 8.7* 8.4* 8.5*  HCT 26.2* 31.8* 31.0* 29.0* 30.3*  MCV 97.4 97.2 99.0 96.3 98.4  PLT 229 310 277 312 307   CBG: Recent Labs  Lab 03/25/21 2153 03/26/21 0338 03/27/21 2111 03/28/21 0737 03/28/21 1135  GLUCAP 90 86 138* 88 144*   Studies/Results: No results found.  ROS: All others negative except those listed in HPI.   Physical Exam: Vitals:   03/28/21 1515  BP: (!) 118/57  Pulse: 80  Resp: 18  Temp: 97.7 F (36.5 C)  TempSrc: Oral   SpO2: 95%  Weight: (!) 160.4 kg  Height: 5\' 4"  (1.626 m)     General: well appearing obese female in NAD Head: NCAT sclera not icteric MMM Neck: Supple. No lymphadenopathy Lungs: CTA bilaterally. No wheeze, rales or rhonchi. Breathing is unlabored on 4L via [place] Heart: RRR. No murmur, rubs or gallops.  Abdomen: soft, nontender, +BS, no guarding, no rebound tenderness Lower extremities:1+ edema b/l, ischemic changes, or open wounds  Neuro: AAOx3. Moves all extremities spontaneously. Psych:  Responds to questions appropriately with a normal affect. Dialysis  Access:TDC  Dialysis Orders:  [facility] TTS [address] RIJ permcath  10000 unit EPO qHD Calcitriol 0.25mg  qd  Assessment/Plan:  Debility - following prolonged hospitalizations.  In CIR.   Leukocytosis - WBC improving.  Likely due to #3.  Recent ESBL UTI/Hematuria/L ureteral stone sp stent placement on 02/14/21 - IV ertapenem for 3 more days per d/c summary. Repeat US with resolution for hydronephrosis.  F/u urology at OP Chronic respiratory failure/hypercapnic respiratory failure - on 2L O2 prior to last admission. Using BiPAP while sleeping with noted improvement.  ESRD -  on HD TTS.  Orders written for HD tomorrow.  Can not come up to HD on BiPAP so avoid night time dialysis.   Hypertension/volume  - Blood pressure well controlled.  Hypotension limits UF, may need to add midodrine for BP support.  Use albumin. Continue home meds.  Volume status improving, continue to titrate volume as tolerated.   Anemia of CKD - Hgb 8.5.Order Aranesp 134mcg qwk  Secondary Hyperparathyroidism -  Calcium and phos in goal.  Continue VDRA.    Nutrition - Renal diet w/fluid restrictions.  Hx A fib - on eliquis. DMT2 - per PMD  Jen Mow, PA-C [facility] 03/28/2021, 3:47 PM

## 2021-03-28 NOTE — Progress Notes (Signed)
°   03/28/21 2252  BiPAP/CPAP/SIPAP  $ Non-Invasive Ventilator  Non-Invasive Vent Set Up  BiPAP/CPAP/SIPAP Pt Type Adult  Mask Type Full face mask  Mask Size Medium  IPAP 14 cmH20  EPAP 7 cmH2O  Flow Rate 4 lpm  BiPAP/CPAP/SIPAP BiPAP  Patient Home Equipment No  Auto Titrate No   Bipap in room pt. Not ready to go on at this time.

## 2021-03-28 NOTE — Progress Notes (Signed)
Spoke with Carman Ching r/t living will documentation; she will review with patient.

## 2021-03-28 NOTE — Progress Notes (Signed)
Inpatient Rehabilitation Admission Medication Review by a Pharmacist  A complete drug regimen review was completed for this patient to identify any potential clinically significant medication issues.  High Risk Drug Classes Is patient taking? Indication by Medication  Antipsychotic No   Anticoagulant Yes Apixaban for PE  Antibiotic Yes Invanz - ESBL e.coli  Opioid Yes Tramadol - pain  Antiplatelet No   Hypoglycemics/insulin Yes SSI - DM  Vasoactive Medication Yes Amlodipine/Toprol - HTN  Chemotherapy No   Other Yes Atorvastatin - HLD Levothyroxine - hypothyroidism Gabapentin - neurontin Vit D - deficiency     Type of Medication Issue Identified Description of Issue Recommendation(s)  Drug Interaction(s) (clinically significant)     Duplicate Therapy     Allergy     No Medication Administration End Date     Incorrect Dose     Additional Drug Therapy Needed  Calcium carbonate Messaged to clarify  Significant med changes from prior encounter (inform family/care partners about these prior to discharge).    Other       Clinically significant medication issues were identified that warrant physician communication and completion of prescribed/recommended actions by midnight of the next day:  Yes  Name of provider notified for urgent issues identified: Marlowe Shores, PA  Provider Method of Notification: Secure chat    Pharmacist comments:   Time spent performing this drug regimen review (minutes):  [address] PharmD, [place], AAHIVP, CPP Infectious Disease Pharmacist 03/28/2021 3:12 PM

## 2021-03-28 NOTE — Evaluation (Signed)
Occupational Therapy Evaluation Patient Details Name: Nicole Schmidt MRN: [id number] DOB: [birth year] Today's Date: 03/28/2021   History of Present Illness Nicole Schmidt is a 79yoF who comes [facility] on 03/17/21 after acute increase in edema limiting ability to walk. Pt was DC to home previous day from inpatient rehab in [place]. PMH: ESRD on HD TTS, DMN, HTN, HLD, AF, PE, anemia, dCHF. Pt was at CIR from 1/21-1/31.   Clinical Impression   Patient presenting with decreased Ind in self care, balance, functional mobility/transfers, endurance, and safety awareness. Pt lives at home alone at baseline. Pt was being seen by acute OT recently but had change in status requiring her to transfer to ICU and now OT with new orders. Patient needing min A of 2 for anterior weight shift to stand x 3 reps this session. Side steps with min A and use of RW but fatigues quickly. Mod A for trunk and B LEs to return to bed at end of session.  Patient will benefit from acute OT to increase overall independence in the areas of ADLs, functional mobility,and safety awareness in order to safely discharge to next venue of care.      Recommendations for follow up therapy are one component of a multi-disciplinary discharge planning process, led by the attending physician.  Recommendations may be updated based on patient status, additional functional criteria and insurance authorization.   Follow Up Recommendations  Acute inpatient rehab (3hours/day)    Assistance Recommended at Discharge Frequent or constant Supervision/Assistance  Patient can return home with the following A lot of help with walking and/or transfers;A lot of help with bathing/dressing/bathroom;Assistance with cooking/housework;Assist for transportation;Help with stairs or ramp for entrance    Functional Status Assessment  Patient has had a recent decline in their functional status and demonstrates the ability to make significant improvements in function in a  reasonable and predictable amount of time.  Equipment Recommendations  Other (comment) (defer to next venue of care)       Precautions / Restrictions Precautions Precautions: Fall Precaution Comments: O2 Restrictions Weight Bearing Restrictions: No      Mobility Bed Mobility Overal bed mobility: Needs Assistance Bed Mobility: Supine to Sit, Sit to Supine     Supine to sit: Min assist, HOB elevated Sit to supine: Mod assist, HOB elevated        Transfers Overall transfer level: Needs assistance Equipment used: Rolling walker (2 wheels) Transfers: Sit to/from Stand Sit to Stand: Min assist, +2 physical assistance           General transfer comment: Pt able to stand x 3reps from EOB with min A of 2. Needs cuing and manual facilitation for anterior weight shift.      Balance Overall balance assessment: Needs assistance Sitting-balance support: Bilateral upper extremity supported Sitting balance-Leahy Scale: Fair Sitting balance - Comments: demonstrates fair static sitting balance at EOB   Standing balance support: Bilateral upper extremity supported Standing balance-Leahy Scale: Fair Standing balance comment: Able to take lateral steps at EOB with MIN GUARD (2 people on either side for safety)                           ADL either performed or assessed with clinical judgement   ADL Overall ADL's : Needs assistance/impaired     Grooming: Wash/dry face;Oral care;Set up;Sitting;Supervision/safety  Functional mobility during ADLs: +2 for physical assistance;Minimal assistance;Standard walker       Vision Baseline Vision/History: 1 Wears glasses Patient Visual Report: No change from baseline Vision Assessment?: No apparent visual deficits            Pertinent Vitals/Pain Pain Assessment Pain Assessment: No/denies pain     Hand Dominance Right   Extremity/Trunk Assessment Upper Extremity  Assessment Upper Extremity Assessment: Generalized weakness   Lower Extremity Assessment Lower Extremity Assessment: Generalized weakness       Communication Communication Communication: No difficulties   Cognition Arousal/Alertness: Awake/alert Behavior During Therapy: WFL for tasks assessed/performed Overall Cognitive Status: Within Functional Limits for tasks assessed                                 General Comments: Pleasant, conversational, A&Ox4. Endorsing fatigue                Home Living Family/patient expects to be discharged to:: Inpatient rehab Living Arrangements: Alone Available Help at Discharge: Friend(s);Available PRN/intermittently Type of Home: House Home Access: Stairs to enter [employer] of Steps: 2 steps in front with no rails and 4 steps in the back with 2 rails - plan to get railings installed on front steps (months back using back door consistently due to need for rails) Entrance Stairs-Rails: Right;Left;Can reach both Home Layout: One level     Bathroom Shower/Tub: Occupational psychologist: Handicapped height Bathroom Accessibility: Yes How Accessible: Accessible via walker Home Equipment: [place] - single point;Rollator (4 wheels);Rolling Walker (2 wheels);BSC/3in1;Wheelchair - manual;Shower seat   Additional Comments: Pt currently lives in her mother's old home that is 1 level with level entry, but planning to move to a new house as soon as possible that's 1 level with 4 steps to enter with B rails. She does not want to return to mother's home as she believe it has mold.  Lives With: Alone    Prior Functioning/Environment Prior Level of Function : Independent/Modified Independent             Mobility Comments: Patient was using cane at inpatient rehab per her report ADLs Comments: Pt reports independence in self care and IADL tasks at baseline        OT Problem List: Decreased activity  tolerance;Cardiopulmonary status limiting activity;Decreased strength;Decreased safety awareness;Impaired balance (sitting and/or standing);Decreased knowledge of use of DME or AE      OT Treatment/Interventions: Self-care/ADL training;Therapeutic exercise;Therapeutic activities;Energy conservation;DME and/or AE instruction;Patient/family education;Manual therapy;Balance training    OT Goals(Current goals can be found in the care plan section) Acute Rehab OT Goals Patient Stated Goal: to go to rehab OT Goal Formulation: With patient Time For Goal Achievement: 04/11/21 Potential to Achieve Goals: Good  OT Frequency: Min 2X/week    Co-evaluation   Reason for Co-Treatment: For patient/therapist safety;To address functional/ADL transfers PT goals addressed during session: Mobility/safety with mobility;Balance;Proper use of DME        AM-PAC OT "6 Clicks" Daily Activity     Outcome Measure Help from another person eating meals?: None Help from another person taking care of personal grooming?: A Little Help from another person toileting, which includes using toliet, bedpan, or urinal?: A Lot Help from another person bathing (including washing, rinsing, drying)?: A Lot Help from another person to put on and taking off regular upper body clothing?: A Little Help from another person to put on and taking  off regular lower body clothing?: A Lot 6 Click Score: 16   End of Session Equipment Utilized During Treatment: Oxygen Nurse Communication: Mobility status  Activity Tolerance: Patient tolerated treatment well;Patient limited by fatigue Patient left: in bed;with call bell/phone within reach;with bed alarm set  OT Visit Diagnosis: Unsteadiness on feet (R26.81);Muscle weakness (generalized) (M62.81)                Time: 0935-1000 OT Time Calculation (min): 25 min Charges:  OT General Charges $OT Visit: 1 Visit OT Evaluation $OT Eval Moderate Complexity: 1 Mod OT  Treatments $Therapeutic Activity: 8-22 mins Darleen Crocker, MS, OTR/L , CBIS ascom [phone]  03/28/21, 12:59 PM

## 2021-03-28 NOTE — H&P (Signed)
Physical Medicine and Rehabilitation Admission H&P       HPI: Nicole Schmidt is a 56 year old right-handed female with history of chronic hypoxemic respiratory failure on 2 L oxygen, diabetes mellitus, breast cancer, history of hematuria left ureteral stone with recent left uteroscopy with laser lithotripsy and stent exchange per Dr. Diamantina Providence, hypertension, hyperlipidemia, gout, morbid obesity with BMI 62.17, atrial fibrillation/pulmonary emboli on Eliquis.  Recent hospital admission 02/01/2021 - 02/22/2021 for management of acute on chronic diastolic congestive heart failure and end-stage renal disease with hemodialysis with permacath placed 02/25/2021.  Patient with CIR admit 03/04/2021 to 03/16/2021 for debility related to chronic renal failure superimposed with diastolic congestive heart failure.  She was discharged to home from CIR modified independent with a cane.  Per chart review patient lives alone.  1 level home 2 steps to entry.  Reports independence in self-care and independent ADL tasks at baseline.  [facility]17[facility]21 with inability to ambulate and generalized lower extremity swelling as well as hypotension..  Patient became more somnolent unable to follow commands ABG showed pH 7.14/PCO2 89/bicarb 30.3 and patient placed on BiPAP.  Admission chemistry sodium 132 chloride 96 glucose 119 BUN 15 creatinine 1.99, hemoglobin 8.3, WBC 13,000, urinalysis negative nitrite, troponin negative, BNP 576, lactic acid 0.7.  Chest x-ray 03/20/2021 showed bilateral pulmonary opacities that were nonspecific.  Cranial CT scan negative for acute changes.  Renal service follow-up with hemodialysis ongoing.  Patient did remain on chronic Eliquis as prior to admission.  Patient continuing oxygen therapy as directed for history of chronic hypoxemic respiratory failure with follow-up chest x-ray 03/22/2021 stable with mild bilateral pulmonary edema.  Acute on chronic anemia latest hemoglobin 8.7- 9.1 noted leukocytosis  [id number] and monitored with fall per infectious disease currently maintained on IV Invanz x 4doses.  Urine culture positive for ESBL Proteus and E. coli treated empirically with 1 dose of fosfomycin 2/9.  Follow-up ultrasound showed resolution of left hydronephrosis.  Therapy evaluations completed due to patient decreased functional mobility was admitted for a comprehensive rehab program.   Review of Systems  Constitutional:  Positive for malaise/fatigue. Negative for chills and fever.  HENT:  Negative for hearing loss.   Eyes:  Negative for blurred vision and double vision.  Respiratory:  Positive for shortness of breath. Negative for cough and wheezing.   Cardiovascular:  Positive for palpitations and leg swelling. Negative for chest pain.  Gastrointestinal:  Positive for constipation. Negative for heartburn, nausea and vomiting.  Genitourinary:  Positive for hematuria. Negative for dysuria and flank pain.  Musculoskeletal:  Positive for joint pain and myalgias.  Skin:  Negative for rash.  Neurological:  Positive for weakness.  All other systems reviewed and are negative.     Past Medical History:  Diagnosis Date   (HFpEF) heart failure with preserved ejection fraction ([place])      a. 10/2018 Echo: EF 60-65%, diast dysfxn, RVSP 50.7 mmHg, mildly dil LA.   Acquired hypothyroidism 02/09/2020   Anemia     Arthritis     Breast cancer ([place]) 2014    right breast cancer   Breast cancer of upper-inner quadrant of right female breast ([place])      Right breast invasive CA and DCIS , 7 mm T1,N0,M0. Er/PR pos, her 2 negative.  Margins 1 mm.   CHF (congestive heart failure) (HCC)     CKD (chronic kidney disease), stage III ([place])     Diabetes mellitus type 2 in obese ([place]) 02/09/2020   Diabetes  mellitus without complication ([place])     Dyslipidemia 02/09/2020   Essential hypertension     Gout     Hypothyroidism     Menopause      age 49   Morbid obesity ([place])     Persistent atrial  fibrillation ([place])      a. Dx 10/2018 in setting of PNA. CHA2DS2VASc = 4-->Eliquis; b. 11/2018 s/p successful DCCV.   Personal history of radiation therapy     Pulmonary embolism ([place]) 10/2014    a. Chronic eliquis.   Thyroid goiter           Past Surgical History:  Procedure Laterality Date   BREAST BIOPSY Right 2014    breast ca   BREAST EXCISIONAL BIOPSY Right 07/19/2012    breast ca   BREAST SURGERY Right 2014    with sentinel node bx subareaolar duct excision   CARDIOVERSION N/A 11/28/2018    Procedure: CARDIOVERSION;  Surgeon: Minna Merritts, MD;  Location: [facility] ORS;  Service: Cardiovascular;  Laterality: N/A;   COLONOSCOPY WITH PROPOFOL N/A 01/30/2017    Procedure: COLONOSCOPY WITH PROPOFOL;  Surgeon: Christene Lye, MD;  Location: [facility] ENDOSCOPY;  Service: Endoscopy;  Laterality: N/A;   CYSTOSCOPY W/ URETERAL STENT PLACEMENT Left 02/14/2021    Procedure: CYSTOSCOPY WITH RETROGRADE PYELOGRAM/URETERAL STENT PLACEMENT;  Surgeon: Billey Co, MD;  Location: [facility] ORS;  Service: Urology;  Laterality: Left;   DIALYSIS/PERMA CATHETER INSERTION N/A 02/24/2021    Procedure: DIALYSIS/PERMA CATHETER INSERTION;  Surgeon: Algernon Huxley, MD;  Location: [place] [facility];  Service: Cardiovascular;  Laterality: N/A;         Family History  Problem Relation Age of Onset   Diabetes Father     Hypertension Father     Parkinson's disease Father     Hypertension Mother     Rheum arthritis Mother     Atrial fibrillation Mother     Heart failure Mother     Heart attack Mother          died in her mid-70's.   Colon cancer Cousin 19   Breast cancer Neg Hx      Social History:  reports that she has never smoked. She has never used smokeless tobacco. She reports that she does not drink alcohol and does not use drugs. Allergies: No Known Allergies       Medications Prior to Admission  Medication Sig Dispense Refill   amLODipine (NORVASC) 5 MG tablet Take 1 tablet (5 mg total) by  mouth daily. 60 tablet 0   apixaban (ELIQUIS) 5 MG TABS tablet Take 1 tablet (5 mg total) by mouth 2 (two) times daily. 60 tablet 0   atorvastatin (LIPITOR) 20 MG tablet Take 1 tablet (20 mg total) by mouth at bedtime. 30 tablet 0   calcitRIOL (ROCALTROL) 0.25 MCG capsule Take 1 capsule (0.25 mcg total) by mouth daily. 30 capsule 0   calcium carbonate (OSCAL) 1500 (600 Ca) MG TABS tablet Take 1 tablet (1,500 mg total) by mouth daily. 30 tablet 0   cloNIDine (CATAPRES) 0.2 MG tablet Take 1 tablet (0.2 mg total) by mouth 2 (two) times daily. 60 tablet 0   Dulaglutide 1.5 MG/0.5ML SOPN Inject 1.5 mg into the skin every 7 (seven) days. 0.5 mL 0   ergocalciferol (VITAMIN D2) 1.25 MG (50000 UT) capsule Take 1 capsule (50,000 Units total) by mouth once a week. 5 capsule 0   folic acid (FOLVITE) 1 MG tablet Take 1 tablet (1  mg total) by mouth daily. 30 tablet 0   gabapentin (NEURONTIN) 300 MG capsule Take 1 capsule (300 mg total) by mouth at bedtime. 30 capsule 0   levothyroxine (SYNTHROID) 112 MCG tablet Take 1 tablet (112 mcg total) by mouth daily before breakfast. 30 tablet 0   melatonin 5 MG TABS Take 2 tablets (10 mg total) by mouth at bedtime as needed (sleep). 60 tablet 0   metoprolol tartrate (LOPRESSOR) 100 MG tablet Take 1 tablet (100 mg total) by mouth 2 (two) times daily. 60 tablet 0   Nystatin (GERHARDT'S BUTT CREAM) CREA Apply 3 application topically 3 (three) times daily. 3 each 0   sevelamer carbonate (RENVELA) 800 MG tablet Take 1 tablet (800 mg total) by mouth 3 (three) times daily with meals. 90 tablet 0   traMADol (ULTRAM) 50 MG tablet Take 1 tablet (50 mg total) by mouth every 6 (six) hours as needed for moderate pain. 30 tablet 0   acetaminophen (TYLENOL) 325 MG tablet Take 2 tablets (650 mg total) by mouth every 6 (six) hours as needed for mild pain (or Fever >/= 101).       albuterol (VENTOLIN HFA) 108 (90 Base) MCG/ACT inhaler Inhale 2 puffs into the lungs every 6 (six) hours as  needed for wheezing or shortness of breath. 8.5 g 0          Home: Home Living Family/patient expects to be discharged to:: Inpatient rehab Living Arrangements: Alone Available Help at Discharge: Friend(s), Available PRN/intermittently Type of Home: House Home Access: Stairs to enter [employer] of Steps: 2 steps in front with no rails and 4 steps in the back with 2 rails - plan to get railings installed on front steps (months back using back door consistently due to need for rails) Entrance Stairs-Rails: Right, Left, Can reach both Home Layout: One level Bathroom Shower/Tub: Multimedia programmer: Handicapped height Bathroom Accessibility: Yes Home Equipment: Harmon - single point, Rollator (4 wheels), Conservation officer, nature (2 wheels), BSC/3in1, Wheelchair - manual, Shower seat Additional Comments: Pt currently lives in her mother's old home that is 1 level with level entry, but planning to move to a new house as soon as possible that's 1 level with 4 steps to enter with B rails. She does not want to return to mother's home as she believe it has mold.  Lives With: Alone   Functional History: Prior Function Prior Level of Function : Independent/Modified Independent Mobility Comments: Patient was using cane at inpatient rehab per her report ADLs Comments: Pt reports independence in self care and IADL tasks at baseline   Functional Status:  Mobility: Bed Mobility Overal bed mobility: Needs Assistance Bed Mobility: Supine to Sit, Sit to Supine Rolling: Max assist Supine to sit: Supervision, HOB elevated (extra time, 2 attempts to pull trunk to sitting EOB) Sit to supine: Max assist, +2 for physical assistance, +2 for safety/equipment, HOB elevated (assistance to lower trunk to bed, elevate BLE onto bed) General bed mobility comments: Pt agreeable to bed level activity only 2/2 fatigue from heatlh concerns and recent PT session. Participates in therapy session with bed in  chair position. Transfers Overall transfer level:  (not safe to attempt without +2 2/2 weakness) Equipment used: Rolling walker (2 wheels) Transfers: Sit to/from Stand Sit to Stand: Mod assist, +2 physical assistance, From elevated surface General transfer comment: patient able to stand x 2 from elevated bed with +2 assist and rocking 3x for momentum Ambulation/Gait Ambulation/Gait assistance: Min guard, +2 safety/equipment  Gait Distance (Feet): 4 Feet Assistive device: Rolling walker (2 wheels) Gait Pattern/deviations: Step-to pattern, Decreased step length - right, Decreased step length - left General Gait Details: patient able to take several side steps at edge of bed with +2 min guard. Fatigued with this. Gait velocity: decreased   ADL: ADL Overall ADL's : Needs assistance/impaired Grooming: Wash/dry face, Oral care, Set up, Bed level Grooming Details (indicate cue type and reason): Performs oral care and face washing with bed in chair position. Lower Body Dressing: Maximal assistance, Sitting/lateral leans Lower Body Dressing Details (indicate cue type and reason): to don socks while seated EOB Functional mobility during ADLs: +2 for physical assistance, Moderate assistance General ADL Comments: Pt presents endorsing generalized fatigue from current health concerns and recent PT session. She is agreeable to bed-level activity and requires SET UP assist for UB grooming tasks.   Cognition: Cognition Overall Cognitive Status: Within Functional Limits for tasks assessed Arousal/Alertness: Awake/alert Orientation Level: Oriented X4 Cognition Arousal/Alertness: Awake/alert Behavior During Therapy: WFL for tasks assessed/performed Overall Cognitive Status: Within Functional Limits for tasks assessed General Comments: Pleasant, conversational, A&Ox4. Endorsing significant fatigue since PT session but denies any other concerns this date.   Physical Exam: Blood pressure (!) 114/52,  pulse 78, temperature 98.1 F (36.7 C), temperature source Axillary, resp. rate 20, height 5\' 4"  (1.626 m), weight (!) 161.1 kg, last menstrual period 07/17/2012, SpO2 98 %. Physical Exam Neurological:     Comments: Patient is alert.  Oriented x3 and follows commands.    General: No acute distress, Morbid obesity  Mood and affect are appropriate Heart: Regular rate and rhythm no rubs murmurs or extra sounds Lungs: Clear to auscultation, breathing unlabored, no rales or wheezes Abdomen: Positive bowel sounds, soft nontender to palpation, nondistended Extremities: No clubbing, cyanosis, or edema Skin: Stasis dermatitis bilateral pre tibial  Neurologic: Cranial nerves II through XII intact, motor strength is 5/5 in bilateral deltoid, bicep, tricep, grip, 3-/ 5 hip flexor, knee extensors, ankle dorsiflexor and plantar flexor Sensory exam normal sensation to light touch and proprioception in bilateral upper and lower extremities Cerebellar exam normal finger to nose to finger as well as heel to shin in bilateral upper and lower extremities Musculoskeletal: Full range of motion in all 4 extremities. No joint swelling    Lab Results Last 48 Hours        Results for orders placed or performed during the hospital encounter of 03/17/21 (from the past 48 hour(s))  Blood gas, arterial     Status: Abnormal    Collection Time: 03/26/21  2:19 PM  Result Value Ref Range    FIO2 0.40      Delivery systems NASAL CANNULA      pH, Arterial 7.31 (L) 7.350 - 7.450    pCO2 arterial 51 (H) 32.0 - 48.0 mmHg    pO2, Arterial 84 83.0 - 108.0 mmHg    Bicarbonate 25.7 20.0 - 28.0 mmol/L    Acid-base deficit 0.8 0.0 - 2.0 mmol/L    O2 Saturation 95.2 %    Patient temperature 37.0      Collection site LEFT BRACHIAL      Sample type ARTERIAL DRAW      Allens test (pass/fail) PASS PASS      Comment: Performed at [facility], [address] [place], [place] [address]  Magnesium     Status: None     Collection Time: 03/27/21  2:15 AM  Result Value Ref Range    Magnesium  2.1 1.7 - 2.4 mg/dL      Comment: Performed at [facility], [address] [place], [place] [address]  Phosphorus     Status: None    Collection Time: 03/27/21  2:15 AM  Result Value Ref Range    Phosphorus 3.2 2.5 - 4.6 mg/dL      Comment: Performed at [facility], [address] [place], [place] [address]  CBC     Status: Abnormal    Collection Time: 03/27/21  2:15 AM  Result Value Ref Range    WBC 12.2 (H) 4.0 - 10.5 K/uL    RBC 3.01 (L) 3.87 - 5.11 MIL/uL    Hemoglobin 8.4 (L) 12.0 - 15.0 g/dL    HCT 29.0 (L) 36.0 - 46.0 %    MCV 96.3 80.0 - 100.0 fL    MCH 27.9 26.0 - 34.0 pg    MCHC 29.0 (L) 30.0 - 36.0 g/dL    RDW 16.6 (H) 11.5 - 15.5 %    Platelets 312 150 - 400 K/uL    nRBC 1.5 (H) 0.0 - 0.2 %      Comment: Performed at [facility], [address] [place], [place] [address]  Glucose, capillary     Status: Abnormal    Collection Time: 03/27/21  9:11 PM  Result Value Ref Range    Glucose-Capillary 138 (H) 70 - 99 mg/dL      Comment: Glucose reference range applies only to samples taken after fasting for at least 8 hours.       Imaging Results (Last 48 hours)  DG Chest Port 1 View   Result Date: 03/26/2021 CLINICAL DATA:  Acute respiratory failure. EXAM: PORTABLE CHEST 1 VIEW COMPARISON:  Chest radiograph dated 03/22/2021. FINDINGS: Dialysis catheter in similar position. Similar appearance of cardiomegaly with vascular congestion and edema. Patchy area of opacity in the right upper lung field progressed since the prior radiograph and may represent atelectasis or developing infiltrate. No large pleural effusion. No pneumothorax. Atherosclerotic calcification of the aorta. No acute osseous pathology. Degenerative changes of the spine. IMPRESSION: 1. Cardiomegaly with vascular congestion and edema. 2. Patchy area of opacity in the right upper lung field progressed since the  prior radiograph and may represent atelectasis or developing infiltrate. Electronically Signed   By: Anner Crete M.D.   On: 03/26/2021 19:25           Blood pressure (!) 114/52, pulse 78, temperature 98.1 F (36.7 C), temperature source Axillary, resp. rate 20, height 5\' 4"  (1.626 m), weight (!) 161.1 kg, last menstrual period 07/17/2012, SpO2 98 %.   Medical Problem List and Plan: 1. Functional deficits secondary to debility/acute on chronic hypercapnia respiratory failure.  Oxygen therapy as directed/BiPAP             -patient may  shower             -ELOS/Goals: 7-10d Mod I goals for mobility and self care  2.  Antithrombotics: -DVT/anticoagulation:  Pharmaceutical: Other (comment) Eliquis             -antiplatelet therapy: N/A 3. Pain Management: Neurontin 300 mg nightly, tramadol as needed 4. Mood: Melatonin as needed             -antipsychotic agents: N/A 5. Neuropsych: This patient is capable of making decisions on her own behalf. 6. Skin/Wound Care: Routine skin checks 7. Fluids/Electrolytes/Nutrition: Routine in and outs with follow-up chemistries 8.  End-stage renal disease.  Continue hemodialysis  as per renal services.  Permacath placement 02/25/2021 9.  Hypertension.   Norvasc 5 mg daily, Toprol XL 25 mg daily.  Monitor with increased mobility 10.  History of atrial fibrillation/pulmonary emboli/acute on chronic CHF.  Continue Eliquis.  Cardiac rate controlled.  Continue beta-blocker.Monitor for fluid overload 11.  Hypothyroidism.  Synthroid 12.  Hyperlipidemia.  Lipitor 13.  History of chronic anemia. 14.  Diabetes mellitus.  Latest hemoglobin A1c 7.2.  SSI 15.  History of hematuria.  Left ureteral stone with recent ureteroscopy/laser lithotripsy.  Repeat ultrasound showed resolution of left hydronephrosis.  Follow-up Dr.Sninsky. 16.  Morbid obesity.  BMI 62.74.  Dietary follow-up 17.  Acute blood loss anemia/leukocytosis.  Follow-up chemistries 18.  Urine culture  positive ESBL/Proteus/E. coli.  Treat empirically with  one dose fosfomycin 2/9.  Contact precautions. 19.Leukocytosis.  WBC improving.  Follow-up per infectious disease Dr.Ravisankar and presently maintained on IV Invanz x 4 does. Lavon Paganini Angiulli, PA-C 03/28/2021  "I have personally performed a face to face diagnostic evaluation of this patient.  Additionally, I have reviewed and concur with the physician assistant's documentation above." Charlett Blake M.D. [place] Fellow Am Acad of [employer] Med and Rehab Diplomate Am Board of Electrodiagnostic Med Fellow Am Board of Interventional Pain

## 2021-03-28 NOTE — Progress Notes (Signed)
Inpatient Rehab Admissions Coordinator:    I have insurance approval and a bed available for pt to admit to CIR today. Dr. Posey Pronto in agreement.  Will let pt/family and TOC team know.  I will arrange transport with [facility] as soon as I have a ready bed.    Shann Medal, PT, DPT Admissions Coordinator [phone] 03/28/21  10:14 AM

## 2021-03-28 NOTE — Discharge Summary (Signed)
Physician Discharge Summary   Patient: Nicole Schmidt MRN: [id number] DOB: Aug 16, 1965  Admit date:     03/17/2021  Discharge date: 03/28/21  Discharge Physician: Fritzi Mandes   PCP: Lenard Simmer, MD   Recommendations at discharge:   patient will follow-up with cardiology Dr. Fletcher Anon in couple weeks after she is out of CIR rehab F/u Dr Diamantina Providence urology for post stent follow-up please make sure patient gets her trilogy arrange prior to discharge from [facility] rehab. [facility] for it. 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  She presented to [facility] ER on 02/2 due to an inability to ambulate up and down her steps due to worsening generalized swelling.  She does have a hx of ESRD and required EMS transport to her first outpatient HD session on 02/2.  However, due to hypotension she only had 1.7L of fluid removed during initial outpatient HD session.  Following her HD treatment she was transported to the ER for further evaluation of her generalized weakness and difficulty with ambulation.     Acute on chronic hypercapnia hypoxic respiratory failure multifactorial -- currently stable on 5 L nasal cannula -- wean as tolerated -- PRN bronchodilators --  Given higher severe hypercapnia and hypoxia patient will benefit from trilogy. --per [facility] "Patient is expected to go to [facility] Monday if stable. CSW spoke to [place] with [facility] and he reported [facility] will order BiPap for patient. If the patient goes home from [facility] and needs equipment, then re-contact [facility]  for those needs"   end-stage renal disease on hemodialysis/pulmonary edema -- continue dialysis with ultrafiltration. -- Patient tolerating it well. -- Nephrology consultation appreciated -- patient gets dialysis Tuesday Thursday Saturday   Leukocytosis H/o left ureteral stone s/p stent placement on 02/14/2021 by Dr Diamantina Providence --urine culture positive for ESBL Proteus and E. Coli -- pt's white count increased to 22k (13K)--18K -- no other source of infection noted. -- Patient received dose of Fosphomycin 2/9 -- discussed with infectious disease Dr. Steva Ready to see patient. White count is still elevated. 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 -- Check sugars daily  morbid obesity following our with suspected sleep apnea -- complicates recovery and prognosis   patient will discharge to CIR today. Overall long-term prognosis guarded.  Discharge plan discussed with patient and family [name] in the room and all questions answered to my best ability        Consultants: PCCM, [facility] cardiology, ID Disposition: Rehabilitation facility Diet recommendation:  Discharge Diet Orders (From admission, onward)     Start     Ordered   03/28/21  0000  Diet - low sodium heart healthy        03/28/21 0945           Cardiac and Carb modified diet  DISCHARGE MEDICATION: Allergies as of 03/28/2021   No Known Allergies      Medication List      STOP taking these medications    cloNIDine 0.2 MG tablet Commonly known as: CATAPRES   Dulaglutide 1.5 MG/0.5ML Sopn   metoprolol tartrate 100 MG tablet Commonly known as: LOPRESSOR       TAKE these medications    acetaminophen 325 MG tablet Commonly known as: TYLENOL Take 2 tablets (650 mg total) by mouth every 6 (six) hours as needed for mild pain (or Fever >/= 101).   albuterol 108 (90 Base) MCG/ACT inhaler Commonly known as: VENTOLIN HFA Inhale 2 puffs into the lungs every 6 (six) hours as needed for wheezing or shortness of breath.   amLODipine 5 MG tablet Commonly known as: NORVASC Take 1 tablet (5 mg total) by mouth daily.   atorvastatin 20 MG tablet Commonly known as: LIPITOR Take 1 tablet (20 mg total) by mouth at bedtime.   calcitRIOL 0.25 MCG capsule Commonly known as: ROCALTROL Take 1 capsule (0.25 mcg total) by mouth daily.   calcium carbonate 1500 (600 Ca) MG Tabs tablet Commonly known as: OSCAL Take 1 tablet (1,500 mg total) by mouth daily.   CertaVite/Antioxidants Tabs Take 1 tablet by mouth at bedtime.   Eliquis 5 MG Tabs tablet Generic drug: apixaban Take 1 tablet (5 mg total) by mouth 2 (two) times daily.   ertapenem 500 mg in sodium chloride 0.9 % 50 mL Inject 500 mg into the vein daily for 2 days. Start taking on: March 29, 261   folic acid 1 MG tablet Commonly known as: FOLVITE Take 1 tablet (1 mg total) by mouth daily.   gabapentin 300 MG capsule Commonly known as: NEURONTIN Take 1 capsule (300 mg total) by mouth at bedtime.   Gerhardt's butt cream Crea Apply 3 application topically 3 (three) times daily.   levothyroxine 112 MCG tablet Commonly known as: SYNTHROID Take 1 tablet (112 mcg total) by mouth daily before breakfast.   melatonin 5 MG Tabs Take 2 tablets (10 mg total) by mouth at bedtime as needed (sleep).   metoprolol succinate 25 MG 24 hr tablet Commonly known as: TOPROL-XL Take 1 tablet (25 mg total) by  mouth daily. Start taking on: March 29, 2021   polyethylene glycol powder 17 GM/SCOOP powder Commonly known as: GLYCOLAX/MIRALAX Take 1 capful (17 g) with water by mouth daily as needed for moderate constipation.   sevelamer carbonate 800 MG tablet Commonly known as: RENVELA Take 1 tablet (800 mg total) by mouth 3 (three) times daily with meals.   traMADol 50 MG tablet Commonly known as: ULTRAM Take 1 tablet (50 mg total) by mouth every 6 (six) hours as needed for moderate pain.   Vitamin D (Ergocalciferol) 1.25 MG (50000 UNIT) Caps capsule Commonly known as: DRISDOL Take 1 capsule (50,000 Units total) by mouth once a week.               Discharge Care Instructions  (From admission, onward)           Start     Ordered   03/28/21 0000  Discharge wound care:       Comments: Pressure Injury 03/20/21 Sacrum Stage 2 -  Partial thickness loss of dermis presenting as a shallow open  injury with a red, pink wound bed without slough. area of redenss and broken skin, previous pressure injury, healing 7 days   03/28/21 0945            Follow-up Information     Morayati, Lourdes Sledge, MD. Call in 1 week(s).   Specialty: Endocrinology Why: family to make appt to see PCP after CIR Contact information: [place] [address] [phone]         Wellington Hampshire, MD. Schedule an appointment as soon as possible for a visit in 1 week(s).   Specialty: Cardiology Why: chf/PAF Contact information: [address] [place] [address] [phone]         Billey Co, MD. Call.   Specialty: Urology Why: family to call and make appt for left renal stent Contact information: [place] [address] [phone]                 Discharge Exam: Danley Danker Weights   03/27/21 0500 03/27/21 2120 03/28/21 0500  Weight: (!) 155.7 kg (!) 161.1 kg (!) 161.1 kg   GENERAL:  57 y.o.-year-old patient lying in the  bed with no acute distress. Morbidly obese LUNGS: decreased breath sounds bilaterally, no wheezing, rales, rhonchi.  CARDIOVASCULAR: S1, S2 normal. No murmurs, rubs, or gallops.  ABDOMEN: Soft, nontender, nondistended. Bowel sounds present.  EXTREMITIES: + bilateral  edema b/l.    NEUROLOGIC: nonfocal SKIN:  Pressure Injury 03/20/21 Sacrum Stage 2 -  Partial thickness loss of dermis presenting as a shallow open injury with a red, pink wound bed without slough. area of redenss and broken skin, previous pressure injury, healing (Active)  03/20/21 1920  Location: Sacrum  Location Orientation:   Staging: Stage 2 -  Partial thickness loss of dermis presenting as a shallow open injury with a red, pink wound bed without slough.  Wound Description (Comments): area of redenss and broken skin, previous pressure injury, healing  Present on Admission: Yes    Condition at discharge: fair  The results of significant diagnostics from this hospitalization (including imaging, microbiology, ancillary and laboratory) are listed below for reference.   Imaging Studies: CT HEAD WO CONTRAST (5MM)  Result Date: 03/20/2021 CLINICAL DATA:  Mental status change. EXAM: CT HEAD WITHOUT CONTRAST TECHNIQUE: Contiguous axial images were obtained from the base of the skull through the vertex without intravenous contrast. RADIATION DOSE REDUCTION: This exam was performed according to the departmental dose-optimization program which includes automated exposure control, adjustment of the mA and/or kV according to patient size and/or use of iterative reconstruction technique. COMPARISON:  None. FINDINGS: Brain: No evidence of acute infarction, hemorrhage, hydrocephalus, extra-axial collection or mass lesion/mass effect. Vascular: No hyperdense vessel. Intracranial atherosclerotic disease. Skull: No osseous abnormality. Sinuses/Orbits: Visualized paranasal sinuses are clear. Visualized mastoid sinuses are clear. Visualized orbits  demonstrate no focal abnormality. Other: None IMPRESSION: 1. No acute intracranial abnormality. Electronically Signed   By: Kathreen Devoid M.D.   On: 03/20/2021 14:08   US RENAL  Result Date: 03/25/2021 CLINICAL DATA:  Hydronephrosis EXAM: RENAL / URINARY TRACT ULTRASOUND COMPLETE COMPARISON:  CT done on 02/12/2021 FINDINGS: Right Kidney: Renal measurements: 10.9 x 6.9 x 6.2 cm = volume: 243.9 mL. There is no hydronephrosis. There is increased cortical echogenicity. There are few cysts each measuring less than 2.2 cm. Left Kidney: Renal measurements: 11.7 by 6 x 6.3 cm = volume: 229.73 mL. There is increased cortical echogenicity. There is no hydronephrosis. There is  11 mm cyst in the midportion. There is interval resolution of left hydronephrosis Bladder: Not distended and not adequately evaluated. Other: None. IMPRESSION: There is no hydronephrosis. Increased cortical echogenicity suggests medical renal disease. Small bilateral renal cysts. Electronically Signed   By: Elmer Picker M.D.   On: 03/25/2021 18:10   DG Chest Port 1 View  Result Date: 03/26/2021 CLINICAL DATA:  Acute respiratory failure. EXAM: PORTABLE CHEST 1 VIEW COMPARISON:  Chest radiograph dated 03/22/2021. FINDINGS: Dialysis catheter in similar position. Similar appearance of cardiomegaly with vascular congestion and edema. Patchy area of opacity in the right upper lung field progressed since the prior radiograph and may represent atelectasis or developing infiltrate. No large pleural effusion. No pneumothorax. Atherosclerotic calcification of the aorta. No acute osseous pathology. Degenerative changes of the spine. IMPRESSION: 1. Cardiomegaly with vascular congestion and edema. 2. Patchy area of opacity in the right upper lung field progressed since the prior radiograph and may represent atelectasis or developing infiltrate. Electronically Signed   By: Anner Crete M.D.   On: 03/26/2021 19:25   DG Chest Port 1 View  Result  Date: 03/22/2021 CLINICAL DATA:  Atelectasis. EXAM: PORTABLE CHEST 1 VIEW COMPARISON:  March 21, 2021. FINDINGS: Stable cardiomegaly with central pulmonary vascular congestion. Right internal jugular dialysis catheter is unchanged in position. Mild bilateral pulmonary edema may be present. Right upper lobe atelectasis is noted. Bony thorax is unremarkable. IMPRESSION: Stable cardiomegaly with central pulmonary vascular congestion and mild bilateral pulmonary edema. Right upper lobe atelectasis. Electronically Signed   By: Marijo Conception M.D.   On: 03/22/2021 14:17   DG Chest Port 1 View  Result Date: 03/21/2021 CLINICAL DATA:  56 year old female with history of shortness of breath and heart failure. Evaluate for interval changes. EXAM: PORTABLE CHEST 1 VIEW COMPARISON:  Chest x-ray 03/20/2021. FINDINGS: Right internal jugular PermCath with tips terminating in the distal superior vena cava and right atrium. Lung volumes are low. Widespread areas of interstitial prominence and patchy ill-defined airspace disease noted throughout the lungs bilaterally, most pronounced in the right mid to upper lung and at the left lung base. Probable small left pleural effusion. No pneumothorax. Cephalization of the pulmonary vasculature. Heart size is mildly enlarged. Upper mediastinal contours are within normal limits. Atherosclerotic calcifications are noted in the thoracic aorta. IMPRESSION: 1. Minimally improved aeration in the lungs bilaterally, favored to reflect resolving multilobar bilateral bronchopneumonia, although some degree of pulmonary edema is also suspected, which could indicate concurrent congestive heart failure, particularly in light of the enlarged cardiac silhouette. 2. Small left pleural effusion. 1. Electronically Signed   By: Vinnie Langton M.D.   On: 03/21/2021 05:25   DG Chest Portable 1 View  Result Date: 03/20/2021 CLINICAL DATA:  Shortness of breath.  History of heart failure. EXAM: PORTABLE  CHEST 1 VIEW COMPARISON:  February 23, 2021 FINDINGS: A double lumen right central line is in good position with 1 tip near the caval atrial junction in the other in the central SVC. No pneumothorax identified. Cardiomegaly stable. The hila and mediastinum are unchanged. Bilateral a platelike opacity seen in the right mid lung. Bilateral pulmonary opacities are identified, right greater than left. The asymmetry may be at least partially due to patient rotation. No other acute abnormalities. IMPRESSION: 1. Bilateral pulmonary opacities are nonspecific. Given history, edema is possible. However, multifocal infection is not excluded on this study due to the somewhat patchy nature of the opacities. Recommend clinical correlation and short-term follow-up imaging to ensure resolution.  2. The platelike opacity in the right mid lung could represent fluid in the fissure or atelectasis. 3. The right central line is in good position. Electronically Signed   By: Dorise Bullion III M.D.   On: 03/20/2021 10:20    Microbiology: Results for orders placed or performed during the hospital encounter of 03/17/21  Resp Panel by RT-PCR (Flu A&B, Covid) Nasopharyngeal Swab     Status: None   Collection Time: 03/17/21  6:36 PM   Specimen: Nasopharyngeal Swab; Nasopharyngeal(NP) swabs in vial transport medium  Result Value Ref Range Status   SARS Coronavirus 2 by RT PCR NEGATIVE NEGATIVE Final    Comment: (NOTE) SARS-CoV-2 target nucleic acids are NOT DETECTED.  The SARS-CoV-2 RNA is generally detectable in upper respiratory specimens during the acute phase of infection. The lowest concentration of SARS-CoV-2 viral copies this assay can detect is 138 copies/mL. A negative result does not preclude SARS-Cov-2 infection and should not be used as the sole basis for treatment or other patient management decisions. A negative result may occur with  improper specimen collection/handling, submission of specimen other than  nasopharyngeal swab, presence of viral mutation(s) within the areas targeted by this assay, and inadequate number of viral copies(<138 copies/mL). A negative result must be combined with clinical observations, patient history, and epidemiological information. The expected result is Negative.  Fact Sheet for Patients:  EntrepreneurPulse.com.au  Fact Sheet for Healthcare Providers:  IncredibleEmployment.be  This test is no t yet approved or cleared by the [place] FDA and  has been authorized for detection and/or diagnosis of SARS-CoV-2 by FDA under an Emergency Use Authorization (EUA). This EUA will remain  in effect (meaning this test can be used) for the duration of the COVID-19 declaration under Section 564(b)(1) of the Act, 21 U.S.C.section 360bbb-3(b)(1), unless the authorization is terminated  or revoked sooner.       Influenza A by PCR NEGATIVE NEGATIVE Final   Influenza B by PCR NEGATIVE NEGATIVE Final    Comment: (NOTE) The Xpert Xpress SARS-CoV-2/FLU/RSV plus assay is intended as an aid in the diagnosis of influenza from Nasopharyngeal swab specimens and should not be used as a sole basis for treatment. Nasal washings and aspirates are unacceptable for Xpert Xpress SARS-CoV-2/FLU/RSV testing.  Fact Sheet for Patients: EntrepreneurPulse.com.au  Fact Sheet for Healthcare Providers: IncredibleEmployment.be  This test is not yet approved or cleared by the [place] FDA and has been authorized for detection and/or diagnosis of SARS-CoV-2 by FDA under an Emergency Use Authorization (EUA). This EUA will remain in effect (meaning this test can be used) for the duration of the COVID-19 declaration under Section 564(b)(1) of the Act, 21 U.S.C. section 360bbb-3(b)(1), unless the authorization is terminated or revoked.  Performed at [facility], [address] [place], [place]  [address]   Resp Panel by RT-PCR (Flu A&B, Covid) Nasopharyngeal Swab     Status: None   Collection Time: 03/20/21  9:39 AM   Specimen: Nasopharyngeal Swab; Nasopharyngeal(NP) swabs in vial transport medium  Result Value Ref Range Status   SARS Coronavirus 2 by RT PCR NEGATIVE NEGATIVE Final    Comment: (NOTE) SARS-CoV-2 target nucleic acids are NOT DETECTED.  The SARS-CoV-2 RNA is generally detectable in upper respiratory specimens during the acute phase of infection. The lowest concentration of SARS-CoV-2 viral copies this assay can detect is 138 copies/mL. A negative result does not preclude SARS-Cov-2 infection and should not be used as the sole basis for treatment or other patient  management decisions. A negative result may occur with  improper specimen collection/handling, submission of specimen other than nasopharyngeal swab, presence of viral mutation(s) within the areas targeted by this assay, and inadequate number of viral copies(<138 copies/mL). A negative result must be combined with clinical observations, patient history, and epidemiological information. The expected result is Negative.  Fact Sheet for Patients:  EntrepreneurPulse.com.au  Fact Sheet for Healthcare Providers:  IncredibleEmployment.be  This test is no t yet approved or cleared by the [place] FDA and  has been authorized for detection and/or diagnosis of SARS-CoV-2 by FDA under an Emergency Use Authorization (EUA). This EUA will remain  in effect (meaning this test can be used) for the duration of the COVID-19 declaration under Section 564(b)(1) of the Act, 21 U.S.C.section 360bbb-3(b)(1), unless the authorization is terminated  or revoked sooner.       Influenza A by PCR NEGATIVE NEGATIVE Final   Influenza B by PCR NEGATIVE NEGATIVE Final    Comment: (NOTE) The Xpert Xpress SARS-CoV-2/FLU/RSV plus assay is intended as an aid in the diagnosis of influenza from  Nasopharyngeal swab specimens and should not be used as a sole basis for treatment. Nasal washings and aspirates are unacceptable for Xpert Xpress SARS-CoV-2/FLU/RSV testing.  Fact Sheet for Patients: EntrepreneurPulse.com.au  Fact Sheet for Healthcare Providers: IncredibleEmployment.be  This test is not yet approved or cleared by the [place] FDA and has been authorized for detection and/or diagnosis of SARS-CoV-2 by FDA under an Emergency Use Authorization (EUA). This EUA will remain in effect (meaning this test can be used) for the duration of the COVID-19 declaration under Section 564(b)(1) of the Act, 21 U.S.C. section 360bbb-3(b)(1), unless the authorization is terminated or revoked.  Performed at [facility], [address], [place], [place] [address]   Urine Culture     Status: Abnormal   Collection Time: 03/20/21  9:40 AM   Specimen: Urine, Clean Catch  Result Value Ref Range Status   Specimen Description   Final    URINE, CLEAN CATCH Performed at [facility], [address] [place], [place] [address]    Special Requests   Final    NONE Performed at [facility], [address] [place], [place] [address]    Culture (A)  Final    >=100,000 COLONIES/mL ESCHERICHIA COLI >=100,000 COLONIES/mL PROTEUS MIRABILIS Confirmed Extended Spectrum Beta-Lactamase Producer (ESBL).  In bloodstream infections from ESBL organisms, carbapenems are preferred over piperacillin/tazobactam. They are shown to have a lower risk of mortality.    Report Status 03/22/2021 FINAL  Final   Organism ID, Bacteria ESCHERICHIA COLI (A)  Final   Organism ID, Bacteria PROTEUS MIRABILIS (A)  Final      Susceptibility   Escherichia coli - MIC*    AMPICILLIN >=32 RESISTANT Resistant     CEFAZOLIN >=64 RESISTANT Resistant     CEFEPIME 2 SENSITIVE Sensitive     CEFTRIAXONE >=64 RESISTANT Resistant     CIPROFLOXACIN >=4 RESISTANT  Resistant     GENTAMICIN <=1 SENSITIVE Sensitive     IMIPENEM <=0.25 SENSITIVE Sensitive     NITROFURANTOIN <=16 SENSITIVE Sensitive     TRIMETH/SULFA >=320 RESISTANT Resistant     AMPICILLIN/SULBACTAM 8 SENSITIVE Sensitive     PIP/TAZO <=4 SENSITIVE Sensitive     * >=100,000 COLONIES/mL ESCHERICHIA COLI   Proteus mirabilis - MIC*    AMPICILLIN >=32 RESISTANT Resistant     CEFAZOLIN 8 SENSITIVE Sensitive     CEFEPIME <=0.12 SENSITIVE Sensitive  CEFTRIAXONE <=0.25 SENSITIVE Sensitive     CIPROFLOXACIN <=0.25 SENSITIVE Sensitive     GENTAMICIN <=1 SENSITIVE Sensitive     IMIPENEM 2 SENSITIVE Sensitive     NITROFURANTOIN RESISTANT Resistant     TRIMETH/SULFA >=320 RESISTANT Resistant     AMPICILLIN/SULBACTAM 16 INTERMEDIATE Intermediate     PIP/TAZO <=4 SENSITIVE Sensitive     * >=100,000 COLONIES/mL PROTEUS MIRABILIS  MRSA Next Gen by PCR, Nasal     Status: None   Collection Time: 03/20/21  2:18 PM   Specimen: Nasal Mucosa; Nasal Swab  Result Value Ref Range Status   MRSA by PCR Next Gen NOT DETECTED NOT DETECTED Final    Comment: (NOTE) The GeneXpert MRSA Assay (FDA approved for NASAL specimens only), is one component of a comprehensive MRSA colonization surveillance program. It is not intended to diagnose MRSA infection nor to guide or monitor treatment for MRSA infections. Test performance is not FDA approved in patients less than 72 years old. Performed at [facility], [address] [place], [place] [address]   Blood culture (routine x 2)     Status: None   Collection Time: 03/20/21  2:29 PM   Specimen: BLOOD  Result Value Ref Range Status   Specimen Description BLOOD LAC  Final   Special Requests BOTTLES DRAWN AEROBIC AND ANAEROBIC BCAV  Final   Culture   Final    NO GROWTH 5 DAYS Performed at [facility], [address] [place], [place] [address]    Report Status 03/25/2021 FINAL  Final  Blood culture (routine x 2)     Status: None    Collection Time: 03/20/21  2:42 PM   Specimen: BLOOD  Result Value Ref Range Status   Specimen Description BLOOD [facility]  Final   Special Requests BOTTLES DRAWN AEROBIC AND ANAEROBIC BCLV  Final   Culture   Final    NO GROWTH 5 DAYS Performed at [facility], [address] [place], [place] [address]    Report Status 03/25/2021 FINAL  Final    Labs: CBC: Recent Labs  Lab 03/22/21 0417 03/24/21 1200 03/25/21 1026 03/27/21 0215 03/28/21 0920  WBC 13.2* 22.3* 18.8* 12.2* 11.4*  NEUTROABS 10.2*  --   --   --   --   HGB 7.5* 9.1* 8.7* 8.4* 8.5*  HCT 26.2* 31.8* 31.0* 29.0* 30.3*  MCV 97.4 97.2 99.0 96.3 98.4  PLT 229 310 277 312 366   Basic Metabolic Panel: Recent Labs  Lab 03/22/21 0417 03/23/21 0524 03/24/21 0316 03/25/21 0350 03/26/21 0153 03/27/21 0215 03/28/21 0920  NA 133* 134*  --   --  131*  --  130*  K 4.2 3.7  --   --  4.7  --  3.7  CL 98 99  --   --  94*  --  95*  CO2 29 27  --   --  26  --  27  GLUCOSE 96 107*  --   --  98  --  125*  BUN 36* 36*  --   --  46*  --  48*  CREATININE 3.36* 3.35*  --   --  4.91*  --  5.32*  CALCIUM 8.6* 8.7*  --   --  9.2  --  9.4  MG 2.0 1.9 2.3 2.2 2.3 2.1  --   PHOS 4.7* 3.9 5.1* 4.1 6.5* 3.2 4.9*   Liver Function Tests: Recent Labs  Lab 03/23/21 0524 03/26/21 0153 03/28/21 0920  AST  --  47*  --   ALT  --  56*  --   ALKPHOS  --  84  --   BILITOT  --  0.7  --   PROT  --  6.6  --   ALBUMIN 2.5* 2.6* 2.8*   CBG: Recent Labs  Lab 03/25/21 2048 03/25/21 2153 03/26/21 0338 03/27/21 2111 03/28/21 0737  GLUCAP 99 90 86 138* 88    Discharge time spent: greater than 30 minutes.  Signed: Fritzi Mandes, MD [facility] 03/28/2021

## 2021-03-28 NOTE — Progress Notes (Signed)
[facility]  PROGRESS NOTE   Subjective:   Patient sitting up in bed, alert and oriented Tolerating meals and managing fluid intake. Denies shortness of breath States she was only able to stand at bedside during therapy.   Urine output recorded of 538ml overnight Bipap overnight May d/c to CIR today  Objective:  Vital signs in last 24 hours:  Temp:  [97.8 F (36.6 C)-98.9 F (37.2 C)] 97.8 F (36.6 C) (02/13 1136) Pulse Rate:  [71-88] 78 (02/13 1136) Resp:  [17-20] 18 (02/13 1136) BP: (114-134)/(50-67) 128/50 (02/13 1136) SpO2:  [92 %-100 %] 94 % (02/13 1136) Weight:  [161.1 kg] 161.1 kg (02/13 0500)  Weight change: 4.9 kg Filed Weights   03/27/21 0500 03/27/21 2120 03/28/21 0500  Weight: (!) 155.7 kg (!) 161.1 kg (!) 161.1 kg    Intake/Output: I/O last 3 completed shifts: In: 909.1 [P.O.:797; IV Piggyback:112.1] Out: 500 [Urine:500]   Intake/Output this shift:  No intake/output data recorded.  Physical Exam: General:  No acute distress, laying in bed  Head:  Normocephalic, atraumatic. Moist oral mucosal membranes  Eyes:  Anicteric  Lungs:   diminished at bases, 3 L [place] O2  Heart:  irregular  Abdomen:   Soft, nontender, bowel sounds present, obese  Extremities:  + peripheral edema.  Neurologic:  Awake, moving all 4 extremities. Alert and oriented x 4  Skin:  No lesions  Access:  RIJ permcath    Basic Metabolic Panel: Recent Labs  Lab 03/22/21 0417 03/23/21 0524 03/24/21 0316 03/25/21 0350 03/26/21 0153 03/27/21 0215 03/28/21 0920  NA 133* 134*  --   --  131*  --  130*  K 4.2 3.7  --   --  4.7  --  3.7  CL 98 99  --   --  94*  --  95*  CO2 29 27  --   --  26  --  27  GLUCOSE 96 107*  --   --  98  --  125*  BUN 36* 36*  --   --  46*  --  48*  CREATININE 3.36* 3.35*  --   --  4.91*  --  5.32*  CALCIUM 8.6* 8.7*  --   --  9.2  --  9.4  MG 2.0 1.9 2.3 2.2 2.3 2.1  --   PHOS 4.7* 3.9 5.1* 4.1 6.5* 3.2 4.9*     CBC: Recent Labs  Lab  03/22/21 0417 03/24/21 1200 03/25/21 1026 03/27/21 0215 03/28/21 0920  WBC 13.2* 22.3* 18.8* 12.2* 11.4*  NEUTROABS 10.2*  --   --   --   --   HGB 7.5* 9.1* 8.7* 8.4* 8.5*  HCT 26.2* 31.8* 31.0* 29.0* 30.3*  MCV 97.4 97.2 99.0 96.3 98.4  PLT 229 310 277 312 307      Urinalysis: No results for input(s): COLORURINE, LABSPEC, PHURINE, GLUCOSEU, HGBUR, BILIRUBINUR, KETONESUR, PROTEINUR, UROBILINOGEN, NITRITE, LEUKOCYTESUR in the last 72 hours.  Invalid input(s): APPERANCEUR     Imaging: No results found.   Medications:    ertapenem 500 mg (03/28/21 1128)    amLODipine  5 mg Oral Daily   apixaban  5 mg Oral BID   atorvastatin  20 mg Oral QHS   bisacodyl  10 mg Rectal Daily   calcitRIOL  0.25 mcg Oral Daily   chlorhexidine  15 mL Mouth Rinse BID   Chlorhexidine Gluconate Cloth  6 each Topical q morning   epoetin (EPOGEN/PROCRIT) injection  10,000 Units Intravenous Q  T,Th,Sa-HD   famotidine  10 mg Oral Daily   feeding supplement (NEPRO CARB STEADY)  237 mL Oral BID BM   folic acid  4,967 mcg Oral Daily   gabapentin  300 mg Oral QHS   Gerhardt's butt cream  3 application Topical TID   levothyroxine  112 mcg Oral QAC breakfast   mouth rinse  15 mL Mouth Rinse q12n4p   metoprolol succinate  25 mg Oral Daily   multivitamin  1 tablet Oral QHS   sevelamer carbonate  800 mg Oral TID WC   Vitamin D (Ergocalciferol)  50,000 Units Oral Weekly    Assessment/ Plan:      [name] is a 56 y.o.white female with end stage renal disease on  hemodialysis, morbid obesity BMI > 59, diabetes mellitus type II, peripheral vascular disease, hypertension, coronary artery disease, congestive heart failure, atrial fibrillation, sleep apnea who is admitted to [facility] on 03/17/2021 for ESRD (end stage renal disease) ([place]) [N18.6] Physical deconditioning [R53.81] Acute respiratory failure with hypoxia ([place]) [J96.01] Ambulatory dysfunction [R26.2] Acute respiratory failure with hypoxia and  hypercapnia ([place]) [J96.01, J96.02]  Patient was just released from rehab prior to discharge.   [facility] TTS [address] RIJ permcath   #1: End Stage Renal Disease: Admitted with some volume overload, now resolved with daily dialysis.   - Next hemodialysis treatment scheduled for Tuesday.    #2: Acute respiratory failure with obstructive sleep apnea with Pickwickian syndrome. Appreciate pulmonology input. Placed on BIPAP overnight.   #3: Hypertension: with atrial fibrillation. Current regimen of amlodipine and metoprolol. BP 128/50  #4: Anemia of chronic kidney disease. Hemoglobin 8.5.   - EPO with TTS schedule  #5. Secondary Hyperparathyroidism: with hyperphosphatemia -Calcium remains at goal, phosphorus elevated at 4.9 -Continue sevelamer with meals and calcitriol daily   LOS: [facility] 2/13/20231:28 PM

## 2021-03-28 NOTE — Progress Notes (Signed)
Pt is advised to wear BIPAp every night and during daytime while taking nap

## 2021-03-29 ENCOUNTER — Other Ambulatory Visit (HOSPITAL_BASED_OUTPATIENT_CLINIC_OR_DEPARTMENT_OTHER): Payer: Self-pay

## 2021-03-29 LAB — GLUCOSE, CAPILLARY
Glucose-Capillary: 99 mg/dL (ref 70–99)
Glucose-Capillary: 101 mg/dL — ABNORMAL HIGH (ref 70–99)
Glucose-Capillary: 109 mg/dL — ABNORMAL HIGH (ref 70–99)
Glucose-Capillary: 161 mg/dL — ABNORMAL HIGH (ref 70–99)

## 2021-03-29 LAB — HEPATITIS B SURFACE ANTIGEN: Hepatitis B Surface Ag: NONREACTIVE

## 2021-03-29 LAB — CBC
HCT: 29 % — ABNORMAL LOW (ref 36.0–46.0)
Hemoglobin: 8.1 g/dL — ABNORMAL LOW (ref 12.0–15.0)
MCH: 27.4 pg (ref 26.0–34.0)
MCHC: 27.9 g/dL — ABNORMAL LOW (ref 30.0–36.0)
MCV: 98 fL (ref 80.0–100.0)
Platelets: 300 10*3/uL (ref 150–400)
RBC: 2.96 MIL/uL — ABNORMAL LOW (ref 3.87–5.11)
RDW: 17.2 % — ABNORMAL HIGH (ref 11.5–15.5)
WBC: 10.3 10*3/uL (ref 4.0–10.5)
nRBC: 0.5 % — ABNORMAL HIGH (ref 0.0–0.2)

## 2021-03-29 LAB — HEPATITIS B SURFACE ANTIBODY,QUALITATIVE: Hep B S Ab: NONREACTIVE

## 2021-03-29 MED ORDER — SODIUM CHLORIDE 0.9 % IV SOLN: 100.0000 mL | INTRAVENOUS | Status: DC | PRN

## 2021-03-29 MED ORDER — CALCIUM CARBONATE 1250 (500 CA) MG PO TABS
1250.0000 mg | ORAL_TABLET | Freq: Every day | ORAL | Status: DC
  Administered 2021-03-29 – 2021-04-15 (×18): 1250 mg via ORAL
  Filled 2021-03-29 (×18): qty 1

## 2021-03-29 MED ORDER — LIDOCAINE HCL (PF) 1 % IJ SOLN: 5.0000 mL | INTRAMUSCULAR | Status: DC | PRN

## 2021-03-29 MED ORDER — HEPARIN SODIUM (PORCINE) 1000 UNIT/ML IJ SOLN
INTRAMUSCULAR | Status: AC
  Filled 2021-03-29: qty 3

## 2021-03-29 MED ORDER — LIDOCAINE-PRILOCAINE 2.5-2.5 % EX CREA: 1.0000 "application " | TOPICAL_CREAM | CUTANEOUS | Status: DC | PRN

## 2021-03-29 MED ORDER — TRAMADOL HCL 50 MG PO TABS: 50.0000 mg | ORAL_TABLET | Freq: Three times a day (TID) | ORAL | Status: DC | PRN

## 2021-03-29 MED ORDER — ALTEPLASE 2 MG IJ SOLR: 2.0000 mg | Freq: Once | INTRAMUSCULAR | Status: DC | PRN

## 2021-03-29 MED ORDER — PENTAFLUOROPROP-TETRAFLUOROETH EX AERO: 1.0000 "application " | INHALATION_SPRAY | CUTANEOUS | Status: DC | PRN

## 2021-03-29 MED ORDER — HEPARIN SODIUM (PORCINE) 1000 UNIT/ML DIALYSIS: 1000.0000 [IU] | INTRAMUSCULAR | Status: DC | PRN

## 2021-03-29 NOTE — Plan of Care (Signed)
°  Problem: RH Balance Goal: LTG Patient will maintain dynamic standing with ADLs (OT) Description: LTG:  Patient will maintain dynamic standing balance with assist during activities of daily living (OT)  Flowsheets (Taken 03/29/2021 1521) LTG: Pt will maintain dynamic standing balance during ADLs with: Independent with assistive device   Problem: Sit to Stand Goal: LTG:  Patient will perform sit to stand in prep for activites of daily living with assistance level (OT) Description: LTG:  Patient will perform sit to stand in prep for activites of daily living with assistance level (OT) Flowsheets (Taken 03/29/2021 1522) LTG: PT will perform sit to stand in prep for activites of daily living with assistance level: Independent with assistive device   Problem: RH Bathing Goal: LTG Patient will bathe all body parts with assist levels (OT) Description: LTG: Patient will bathe all body parts with assist levels (OT) Flowsheets (Taken 03/29/2021 1522) LTG: Pt will perform bathing with assistance level/cueing: Independent with assistive device    Problem: RH Dressing Goal: LTG Patient will perform upper body dressing (OT) Description: LTG Patient will perform upper body dressing with assist, with/without cues (OT). Flowsheets (Taken 03/29/2021 1522) LTG: Pt will perform upper body dressing with assistance level of: Independent with assistive device   Problem: RH Dressing Goal: LTG Patient will perform lower body dressing w/assist (OT) Description: LTG: Patient will perform lower body dressing with assist, with/without cues in positioning using equipment (OT) Flowsheets (Taken 03/29/2021 1522) LTG: Pt will perform lower body dressing with assistance level of: Independent with assistive device   Problem: RH Toileting Goal: LTG Patient will perform toileting task (3/3 steps) with assistance level (OT) Description: LTG: Patient will perform toileting task (3/3 steps) with assistance level (OT)   Flowsheets (Taken 03/29/2021 1522) LTG: Pt will perform toileting task (3/3 steps) with assistance level: Independent with assistive device   Problem: RH Simple Meal Prep Goal: LTG Patient will perform simple meal prep w/assist (OT) Description: LTG: Patient will perform simple meal prep with assistance, with/without cues (OT). Flowsheets (Taken 03/29/2021 1522) LTG: Pt will perform simple meal prep with assistance level of: Independent with assistive device   Problem: RH Light Housekeeping Goal: LTG Patient will perform light housekeeping w/assist (OT) Description: LTG: Patient will perform light housekeeping with assistance, with/without cues (OT). Flowsheets (Taken 03/29/2021 1522) LTG: Pt will perform light housekeeping with assistance level of: Independent with assistive device   Problem: RH Toilet Transfers Goal: LTG Patient will perform toilet transfers w/assist (OT) Description: LTG: Patient will perform toilet transfers with assist, with/without cues using equipment (OT) Flowsheets (Taken 03/29/2021 1522) LTG: Pt will perform toilet transfers with assistance level of: Independent with assistive device   Problem: RH Tub/Shower Transfers Goal: LTG Patient will perform tub/shower transfers w/assist (OT) Description: LTG: Patient will perform tub/shower transfers with assist, with/without cues using equipment (OT) Flowsheets (Taken 03/29/2021 1522) LTG: Pt will perform tub/shower stall transfers with assistance level of: Independent with assistive device

## 2021-03-29 NOTE — Progress Notes (Signed)
Inpatient Rehabilitation Admission Medication Review by a Pharmacist  A complete drug regimen review was completed for this patient to identify any potential clinically significant medication issues.  High Risk Drug Classes Is patient taking? Indication by Medication  Antipsychotic No   Anticoagulant Yes Apixaban for PE  Antibiotic Yes Invanz - ESBL e.coli  Opioid Yes Tramadol - pain  Antiplatelet No   Hypoglycemics/insulin Yes SSI - DM  Vasoactive Medication Yes Amlodipine/Toprol - HTN  Chemotherapy No   Other Yes Atorvastatin - HLD Levothyroxine - hypothyroidism Gabapentin - neurontin Vit D - deficiency     Type of Medication Issue Identified Description of Issue Recommendation(s)  Drug Interaction(s) (clinically significant)     Duplicate Therapy     Allergy     No Medication Administration End Date     Incorrect Dose     Additional Drug Therapy Needed  Calcium carbonate Resumed  Significant med changes from prior encounter (inform family/care partners about these prior to discharge).    Other       Clinically significant medication issues were identified that warrant physician communication and completion of prescribed/recommended actions by midnight of the next day:  Yes>No  Name of provider notified for urgent issues identified: Marlowe Shores, PA  Provider Method of Notification: Secure chat    Pharmacist comments:   Time spent performing this drug regimen review (minutes):  [address] PharmD, [place], AAHIVP, CPP Infectious Disease Pharmacist 03/29/2021 8:10 AM

## 2021-03-29 NOTE — Evaluation (Signed)
Occupational Therapy Assessment and Plan  Patient Details  Name: Nicole Schmidt MRN: [id number] Date of Birth: [birth year]  OT Diagnosis: muscle weakness (generalized) Rehab Potential: Rehab Potential (ACUTE ONLY): Good ELOS: 2 weeks   Today's Date: 03/29/2021 OT Individual Time: 0910-1030 OT Individual Time Calculation (min): 80 min     Hospital Problem: Principal Problem:   Debility   Past Medical History:  Past Medical History:  Diagnosis Date   (HFpEF) heart failure with preserved ejection fraction ([place])    a. 10/2018 Echo: EF 60-65%, diast dysfxn, RVSP 50.7 mmHg, mildly dil LA.   Acquired hypothyroidism 02/09/2020   Anemia    Arthritis    Breast cancer ([place]) 2014   right breast cancer   Breast cancer of upper-inner quadrant of right female breast ([place])    Right breast invasive CA and DCIS , 7 mm T1,N0,M0. Er/PR pos, her 2 negative.  Margins 1 mm.   CHF (congestive heart failure) (HCC)    CKD (chronic kidney disease), stage III (HCC)    Diabetes mellitus type 2 in obese ([place]) 02/09/2020   Diabetes mellitus without complication ([place])    Dyslipidemia 02/09/2020   Essential hypertension    Gout    Hypothyroidism    Menopause    age 85   Morbid obesity ([place])    Persistent atrial fibrillation ([place])    a. Dx 10/2018 in setting of PNA. CHA2DS2VASc = 4-->Eliquis; b. 11/2018 s/p successful DCCV.   Personal history of radiation therapy    Pulmonary embolism ([place]) 10/2014   a. Chronic eliquis.   Thyroid goiter    Past Surgical History:  Past Surgical History:  Procedure Laterality Date   BREAST BIOPSY Right 2014   breast ca   BREAST EXCISIONAL BIOPSY Right 07/19/2012   breast ca   BREAST SURGERY Right 2014   with sentinel node bx subareaolar duct excision   CARDIOVERSION N/A 11/28/2018   Procedure: CARDIOVERSION;  Surgeon: Minna Merritts, MD;  Location: [facility] ORS;  Service: Cardiovascular;  Laterality: N/A;   COLONOSCOPY WITH PROPOFOL N/A 01/30/2017   Procedure:  COLONOSCOPY WITH PROPOFOL;  Surgeon: Christene Lye, MD;  Location: [facility] ENDOSCOPY;  Service: Endoscopy;  Laterality: N/A;   CYSTOSCOPY W/ URETERAL STENT PLACEMENT Left 02/14/2021   Procedure: CYSTOSCOPY WITH RETROGRADE PYELOGRAM/URETERAL STENT PLACEMENT;  Surgeon: Billey Co, MD;  Location: [facility] ORS;  Service: Urology;  Laterality: Left;   DIALYSIS/PERMA CATHETER INSERTION N/A 02/24/2021   Procedure: DIALYSIS/PERMA CATHETER INSERTION;  Surgeon: Algernon Huxley, MD;  Location: [facility];  Service: Cardiovascular;  Laterality: N/A;    Assessment & Plan Clinical Impression: Patient is a 56 year old right-handed female with history of chronic hypoxemic respiratory failure on 2 L oxygen, diabetes mellitus, breast cancer, history of hematuria left ureteral stone with recent left uteroscopy with laser lithotripsy and stent exchange per Dr. Diamantina [place], hypertension, hyperlipidemia, gout, morbid obesity with BMI 62.17, atrial fibrillation/pulmonary emboli on Eliquis.  Recent hospital admission 02/01/2021 - 02/22/2021 for management of acute on chronic diastolic congestive heart failure and end-stage renal disease with hemodialysis with permacath placed 02/25/2021.  Patient with CIR admit 03/04/2021 to 03/16/2021 for debility related to chronic renal failure superimposed with diastolic congestive heart failure.  She was discharged to home from CIR modified independent with a cane.  Per chart review patient lives alone.  1 level home 2 steps to entry.  Reports independence in self-care and independent ADL tasks at baseline.  [facility]21 with inability to ambulate and  generalized lower extremity swelling as well as hypotension..  Patient became more somnolent unable to follow commands ABG showed pH 7.14/PCO2 89/bicarb 30.3 and patient placed on BiPAP.  Admission chemistry sodium 132 chloride 96 glucose 119 BUN 15 creatinine 1.99, hemoglobin 8.3, WBC 13,000, urinalysis negative nitrite,  troponin negative, BNP 576, lactic acid 0.7.  Chest x-ray 03/20/2021 showed bilateral pulmonary opacities that were nonspecific.  Cranial CT scan negative for acute changes.  Renal service follow-up with hemodialysis ongoing.  Patient did remain on chronic Eliquis as prior to admission.  Patient continuing oxygen therapy as directed for history of chronic hypoxemic respiratory failure with follow-up chest x-ray 03/22/2021 stable with mild bilateral pulmonary edema.  Acute on chronic anemia latest hemoglobin 8.7- 9.1 noted leukocytosis [id number] and monitored with fall per infectious disease currently maintained on IV Invanz x 4doses.  Urine culture positive for ESBL Proteus and E. coli treated empirically with 1 dose of fosfomycin 2/9.  Follow-up ultrasound showed resolution of left hydronephrosis.  Therapy evaluations completed due to patient decreased functional mobility was admitted for a comprehensive rehab program.  Patient currently requires max with BADL and IADL secondary to muscle weakness and decreased cardiorespiratoy endurance.  Prior to hospitalization, patient could complete UB/LB BADL with modified independent .  Patient will benefit from skilled intervention to increase independence with basic self-care skills prior to discharge home independently- ModI.  Anticipate patient will require intermittent supervision and follow up home health.  OT - End of Session Activity Tolerance: Tolerates 30+ min activity with multiple rests Endurance Deficit: Yes Endurance Deficit Description: Patient present with increased fatigue impacating how she approaches funcitonal task with independence and safety OT Assessment Rehab Potential (ACUTE ONLY): Good OT Barriers to Discharge: Decreased caregiver support;Inaccessible home environment;Home environment access/layout;Weight OT Patient demonstrates impairments in the following area(s): Balance;Endurance;Pain;Motor (patient indicated that she  typically has pain, but today was a good day bilateral LE) OT Basic ADL's Functional Problem(s): Dressing;Bathing;Grooming OT Advanced ADL's Functional Problem(s): Simple Meal Preparation;Light Housekeeping OT Transfers Functional Problem(s): Toilet;Tub/Shower OT Additional Impairment(s): None (patient presents as extremely deconditioned.) OT Plan OT Intensity: Minimum of 1-2 x/day, 45 to 90 minutes OT Frequency: 5 out of 7 days OT Duration/Estimated Length of Stay: 2 weeks OT Treatment/Interventions: Therapeutic Exercise;Patient/family education;UE/LE Strength taining/ROM;Pain management;Self Care/advanced ADL retraining OT Basic Self-Care Anticipated Outcome(s): ModI OT Toileting Anticipated Outcome(s): ModI OT Bathroom Transfers Anticipated Outcome(s): ModI OT Recommendation Recommendations for Other Services: Other (comment) (CSW) Patient destination: Home Follow Up Recommendations: Home health OT Equipment Recommended: To be determined;3 in 1 bedside comode   OT Evaluation Precautions/Restrictions  Precautions Precautions: Fall Precaution Comments: monitor O2 Restrictions Weight Bearing Restrictions: No General Chart Reviewed: Yes Family/Caregiver Present: No Vital Signs Therapy Vitals Temp: 98.1 F (36.7 C) Temp Source: Oral Pulse Rate: 80 BP: (!) 122/57 Patient Position (if appropriate): Sitting Oxygen Therapy SpO2: 97 % O2 Device: Nasal Cannula 2L Pain Pain Assessment Pain Scale: 0-10 Pain Score: 0-No pain Home Living/Prior Functioning Home Living Living Arrangements: Alone Available Help at Discharge: Friend(s), Available PRN/intermittently Type of Home: House Home Access: Stairs to enter [employer] of Steps: 2 steps in front with no rails and 4 steps in the back with 2 rails - plan to get friend to install ramp on front. Entrance Stairs-Rails: Right, Left, Can reach both Home Layout: One level Bathroom Shower/Tub: Tourist information centre manager: Handicapped height Bathroom Accessibility: Yes Additional Comments: When pt discharged from last CIR stay, her swelling was so bad she couldn't bend  her knees to get in the car or lift her legs to get up the steps.  Lives With: Alone IADL History Homemaking Responsibilities: Yes Meal Prep Responsibility: Primary Laundry Responsibility: Primary Cleaning Responsibility: Primary Bill Paying/Finance Responsibility: Primary Shopping Responsibility: Primary Prior Function Level of Independence: Requires assistive device for independence  Able to Take Stairs?: No Driving: No Vocation: Retired Surveyor, mining Baseline Vision/History: 1 Wears glasses Ability to See in Adequate Light: 0 Adequate Patient Visual Report: No change from baseline Vision Assessment?:  (patient able to track and converge at the time of assessment) Perception  Perception: Within Functional Limits Praxis Praxis: Intact Cognition Overall Cognitive Status: Within Functional Limits for tasks assessed Arousal/Alertness: Awake/alert Orientation Level: Person;Place;Situation Person: Oriented Place: Oriented Situation: Oriented Year: 2023 Month: January Day of Week: Correct Memory: Appears intact Immediate Memory Recall: Sock;Blue;Bed Memory Recall Sock: Without Cue Memory Recall Blue: Without Cue Memory Recall Bed: Without Cue Attention: Focused Awareness: Appears intact Problem Solving: Appears intact Safety/Judgment: Appears intact Sensation Sensation Light Touch: Appears Intact Hot/Cold: Appears Intact Proprioception: Appears Intact Stereognosis: Not tested Coordination Gross Motor Movements are Fluid and Coordinated: Yes Fine Motor Movements are Fluid and Coordinated: Yes Coordination and Movement Description: generalized weakness/deconditioning Motor  Motor Motor: Within Functional Limits Motor - Skilled Clinical Observations: generalized weakness/deconditioning  Trunk/Postural Assessment   Cervical Assessment Cervical Assessment: Within Functional Limits Thoracic Assessment Thoracic Assessment: Within Functional Limits Lumbar Assessment Lumbar Assessment: Exceptions to [facility] (anterior pelvic tilt due to body habitus) Postural Control Postural Control: Within Functional Limits  Balance Balance Balance Assessed: Yes Static Sitting Balance Static Sitting - Balance Support: Feet supported;Bilateral upper extremity supported Static Sitting - Level of Assistance: 5: Stand by assistance (supervision) Dynamic Sitting Balance Dynamic Sitting - Balance Support: Feet supported;No upper extremity supported Dynamic Sitting - Level of Assistance: 5: Stand by assistance (supervision) Static Standing Balance Static Standing - Balance Support: Bilateral upper extremity supported (RW) Static Standing - Level of Assistance: 5: Stand by assistance (CGA) Dynamic Standing Balance Dynamic Standing - Balance Support: Bilateral upper extremity supported (RW) Dynamic Standing - Level of Assistance: 4: Min assist Dynamic Standing - Comments: with transfers Extremity/Trunk Assessment RUE Assessment RUE Assessment: Within Functional Limits General Strength Comments: 3/5MMT LUE Assessment General Strength Comments: 3/5MMT  Care Tool Care Tool Self Care Eating   Eating Assist Level: Independent    Oral Care    Oral Care Assist Level: Set up assist    Bathing   Body parts bathed by patient: Right arm;Left arm;Chest;Abdomen;Front perineal area;Right upper leg;Left upper leg;Face (Patient was able to simulate bathing UB, but was met with challenges for LB funciton for lower legs and feet.) Body parts bathed by helper: Right lower leg;Left lower leg;Buttocks (Areas the pt was unable to address)   Assist Level:  (pt would benefit from additional training with A/E) Assistive Device Comment: Long handled sponge, seated bathing (patient would benefit from long handle sponge and is reported as  having a raise toilet already in place.)  Upper Body Dressing(including orthotics)   What is the patient wearing?: Pull over shirt   Assist Level: Minimal Assistance - Patient > 75% (secondary to O2 canal placement.)    Lower Body Dressing (excluding footwear)   What is the patient wearing?: Pants;Underwear/pull up Assist for lower body dressing: Maximal Assistance - Patient 25 - 49% (Secondary to fatigue and challenges with active range) Assistive Device Comment: with reacher (Patient would benefit from additional practice)  Putting on/Taking off footwear   What is the  patient wearing?: Non-skid slipper socks Assist for footwear: Maximal Assistance - Patient 25 - 49% Assistive Device Comment: sock aid (pt would benefit additional practice using A/E)     Care Tool Toileting Toileting activity Toileting Activity did not occur (Clothing management and hygiene only): Refused Assist for toileting:  (Patient is able to stand using the RW for balance for toilet transfer with assistance X1 to aid with clothing management.)     Care Tool Bed Mobility Roll left and right activity Roll left and right activity did not occur: Refused (Patient indicated that she was to fatigue at the time of eval additnal assessing would be needed.)      Sit to lying activity Sit to lying activity did not occur: Refused      Lying to sitting on side of bed activity Lying to sitting on side of bed activity did not occur: the ability to move from lying on the back to sitting on the side of the bed with no back support.: Refused       Care Tool Transfers Sit to stand transfer   Sit to stand assist level: Moderate Assistance - Patient 50 - 74% Sit to stand assistive device: Walker  Chair/bed transfer Chair/bed transfer activity did not occur: Refused (additional assessing needed in this area)       Materials engineer transfer activity did not occur: Refused (additional assessing needed.) Assist Level:  Minimal Assistance - Patient > 75% (hand held assist)     Care Tool Cognition  Expression of Ideas and Wants Expression of Ideas and Wants: 4. Without difficulty (complex and basic) - expresses complex messages without difficulty and with speech that is clear and easy to understand  Understanding Verbal and Non-Verbal Content Understanding Verbal and Non-Verbal Content: 4. Understands (complex and basic) - clear comprehension without cues or repetitions   Memory/Recall Ability Memory/Recall Ability : Current season;Location of own room;Staff names and faces;That he or she is in a hospital/hospital unit   Refer to Care Plan for [place] 1 OT Short Term Goal 1 (Week 1): STG = LTG 2/2 ELOS  Recommendations for other services: None    Skilled Therapeutic Intervention Patient was instructed in safe and independent sit to stand transfers to improve independence and safety, the pt was instructed in energy conservation techniques to improve compliance.  The pt was instructed in LB dressing using A/E to improve safe and independent function with BADL/IADL related task completions. ADL ADL Equipment Provided: Long-handled sponge;Reacher Eating: Independent Where Assessed-Eating:  (patient not observed in this area, however, the pt is s/u in this area of function) Grooming: Setup Where Assessed-Grooming: Wheelchair (patient able to stand for short periods of time secondary to deconditioning.) Upper Body Bathing: Setup (patient was able to simulate bathing UB at w/c LOF with setup) Where Assessed-Upper Body Bathing: Chair Lower Body Bathing: Maximal assistance Where Assessed-Lower Body Bathing: Chair Upper Body Dressing: Setup Where Assessed-Upper Body Dressing: Chair Lower Body Dressing: Maximal assistance Where Assessed-Lower Body Dressing: Chair Toileting: Dependent Where Assessed-Toileting: Other (Comment) (patient is currently using the bed pan with a desire  to transition to previous LOF, Per pt report.) Toilet Transfer: Not assessed Toilet Transfer Method: Not assessed Tub/Shower Transfer: Not assessed Social research officer, government: Not assessed Mobility  Bed Mobility Bed Mobility: Not assessed (Additional information needed in relation to bed mobility, the pt was at chair LOF upon arrival) Rolling Right: Moderate Assistance - Patient 50-74% Supine to Sit: Moderate Assistance -  Patient 50-74% Transfers Sit to Stand: Moderate Assistance - Patient 50-74% Stand to Sit: Contact Guard/Touching assist   Discharge Criteria: Patient will be discharged from OT if patient refuses treatment 3 consecutive times without medical reason, if treatment goals not met, if there is a change in medical status, if patient makes no progress towards goals or if patient is discharged from hospital.  The above assessment, treatment plan, treatment alternatives and goals were discussed and mutually agreed upon: by patient  [name] 03/29/2021, 2:38 PM

## 2021-03-29 NOTE — Discharge Instructions (Addendum)
Inpatient Rehab Discharge Instructions  Nicole Schmidt Discharge date and time: No discharge date for patient encounter.   Activities/Precautions/ Functional Status: Activity: activity as tolerated Diet: renal diet with 1200 mL fluid restriction Wound Care: Routine skin checks Functional status:  ___ No restrictions     ___ Walk up steps independently ___ 24/7 supervision/assistance   ___ Walk up steps with assistance ___ Intermittent supervision/assistance  ___ Bathe/dress independently ___ Walk with walker     _x__ Bathe/dress with assistance ___ Walk Independently    ___ Shower independently ___ Walk with assistance    ___ Shower with assistance ___ No alcohol  COMMUNITY REFERRALS UPON DISCHARGE:    Home Health:   PT     OT      SN             Agency: [facility]  Phone:[phone]   Medical Equipment/Items Ordered: Bipap                                                 Agency/Supplier:[facility] [phone]   Continue hemodialysis as directed  Continue oxygen therapy as directed.  CPAP as directed   My questions have been answered and I understand these instructions. I will adhere to these goals and the provided educational materials after my discharge from the hospital.  Patient/Caregiver Signature _______________________________ Date __________  Clinician Signature _______________________________________ Date __________  Please bring this form and your medication list with you to all your follow-up doctor's appointments.     Information on my medicine - ELIQUIS (apixaban)  This medication education was reviewed with me or my healthcare representative as part of my discharge preparation.  The pharmacist that spoke with me during my hospital stay was:    Why was Eliquis prescribed for you? Eliquis was prescribed to treat blood clots that may have been found in the veins of your legs (deep vein thrombosis) or in your lungs (pulmonary embolism) and to reduce the risk  of them occurring again.  What do You need to know about Eliquis ? Continue Eliquis 5 mg tablet taken TWICE daily.  Eliquis may be taken with or without food.   Try to take the dose about the same time in the morning and in the evening. If you have difficulty swallowing the tablet whole please discuss with your pharmacist how to take the medication safely.  Take Eliquis exactly as prescribed and DO NOT stop taking Eliquis without talking to the doctor who prescribed the medication.  Stopping may increase your risk of developing a new blood clot.  Refill your prescription before you run out.  After discharge, you should have regular check-up appointments with your healthcare provider that is prescribing your Eliquis.    What do you do if you miss a dose? If a dose of ELIQUIS is not taken at the scheduled time, take it as soon as possible on the same day and twice-daily administration should be resumed. The dose should not be doubled to make up for a missed dose.  Important Safety Information A possible side effect of Eliquis is bleeding. You should call your healthcare provider right away if you experience any of the following: Bleeding from an injury or your nose that does not stop. Unusual colored urine (red or dark brown) or unusual colored stools (red or black). Unusual bruising for unknown reasons. A  serious fall or if you hit your head (even if there is no bleeding).  Some medicines may interact with Eliquis and might increase your risk of bleeding or clotting while on Eliquis. To help avoid this, consult your healthcare provider or pharmacist prior to using any new prescription or non-prescription medications, including herbals, vitamins, non-steroidal anti-inflammatory drugs (NSAIDs) and supplements.  This website has more information on Eliquis (apixaban): http://[email or web address].com/eliquis/home

## 2021-03-29 NOTE — Evaluation (Signed)
Physical Therapy Assessment and Plan  Patient Details  Name: Nicole Schmidt MRN: [id number] Date of Birth: Dec 25, 1965  PT Diagnosis: Abnormal posture, Abnormality of gait, Cognitive deficits, Difficulty walking, Edema, Muscle weakness, and Pain in feet when swollen Rehab Potential: Fair ELOS: 2 weeks   Today's Date: 03/29/2021 PT Individual Time: 0801-0856 PT Individual Time Calculation (min): 55 min    Hospital Problem: Principal Problem:   Debility   Past Medical History:  Past Medical History:  Diagnosis Date   (HFpEF) heart failure with preserved ejection fraction ([place])    a. 10/2018 Echo: EF 60-65%, diast dysfxn, RVSP 50.7 mmHg, mildly dil LA.   Acquired hypothyroidism 02/09/2020   Anemia    Arthritis    Breast cancer ([place]) 2014   right breast cancer   Breast cancer of upper-inner quadrant of right female breast ([place])    Right breast invasive CA and DCIS , 7 mm T1,N0,M0. Er/PR pos, her 2 negative.  Margins 1 mm.   CHF (congestive heart failure) (HCC)    CKD (chronic kidney disease), stage III (HCC)    Diabetes mellitus type 2 in obese ([place]) 02/09/2020   Diabetes mellitus without complication ([place])    Dyslipidemia 02/09/2020   Essential hypertension    Gout    Hypothyroidism    Menopause    age 56   Morbid obesity ([place])    Persistent atrial fibrillation ([place])    a. Dx 10/2018 in setting of PNA. CHA2DS2VASc = 4-->Eliquis; b. 11/2018 s/p successful DCCV.   Personal history of radiation therapy    Pulmonary embolism ([place]) 10/2014   a. Chronic eliquis.   Thyroid goiter    Past Surgical History:  Past Surgical History:  Procedure Laterality Date   BREAST BIOPSY Right 2014   breast ca   BREAST EXCISIONAL BIOPSY Right 07/19/2012   breast ca   BREAST SURGERY Right 2014   with sentinel node bx subareaolar duct excision   CARDIOVERSION N/A 11/28/2018   Procedure: CARDIOVERSION;  Surgeon: Minna Merritts, MD;  Location: [facility] ORS;  Service: Cardiovascular;  Laterality:  N/A;   COLONOSCOPY WITH PROPOFOL N/A 01/30/2017   Procedure: COLONOSCOPY WITH PROPOFOL;  Surgeon: Christene Lye, MD;  Location: [facility] ENDOSCOPY;  Service: Endoscopy;  Laterality: N/A;   CYSTOSCOPY W/ URETERAL STENT PLACEMENT Left 02/14/2021   Procedure: CYSTOSCOPY WITH RETROGRADE PYELOGRAM/URETERAL STENT PLACEMENT;  Surgeon: Billey Co, MD;  Location: [facility] ORS;  Service: Urology;  Laterality: Left;   DIALYSIS/PERMA CATHETER INSERTION N/A 02/24/2021   Procedure: DIALYSIS/PERMA CATHETER INSERTION;  Surgeon: Algernon Huxley, MD;  Location: [place] [facility];  Service: Cardiovascular;  Laterality: N/A;    Assessment & Plan Clinical Impression: Patient is a 56 y.o. year old female with history of chronic hypoxemic respiratory failure on 2 L oxygen, diabetes mellitus, breast cancer, history of hematuria left ureteral stone with recent left uteroscopy with laser lithotripsy and stent exchange per Dr. Diamantina Providence, hypertension, hyperlipidemia, gout, morbid obesity with BMI 62.17, atrial fibrillation/pulmonary emboli on Eliquis.  Recent hospital admission 02/01/2021 - 02/22/2021 for management of acute on chronic diastolic congestive heart failure and end-stage renal disease with hemodialysis with permacath placed 02/25/2021.  Patient with CIR admit 03/04/2021 to 03/16/2021 for debility related to chronic renal failure superimposed with diastolic congestive heart failure.  She was discharged to home from CIR modified independent with a cane.  Per chart review patient lives alone.  1 level home 2 steps to entry.  Reports independence in self-care and independent ADL tasks  at baseline.  [facility] with inability to ambulate and generalized lower extremity swelling as well as hypotension..  Patient became more somnolent unable to follow commands ABG showed pH 7.14/PCO2 89/bicarb 30.3 and patient placed on BiPAP.  Admission chemistry sodium 132 chloride 96 glucose 119 BUN 15 creatinine 1.99,  hemoglobin 8.3, WBC 13,000, urinalysis negative nitrite, troponin negative, BNP 576, lactic acid 0.7.  Chest x-ray 03/20/2021 showed bilateral pulmonary opacities that were nonspecific.  Cranial CT scan negative for acute changes.  Renal service follow-up with hemodialysis ongoing.  Patient did remain on chronic Eliquis as prior to admission.  Patient continuing oxygen therapy as directed for history of chronic hypoxemic respiratory failure with follow-up chest x-ray 03/22/2021 stable with mild bilateral pulmonary edema.  Acute on chronic anemia latest hemoglobin 8.7- 9.1 noted leukocytosis [id number] and monitored with fall per infectious disease currently maintained on IV Invanz x 4doses.  Urine culture positive for ESBL Proteus and E. coli treated empirically with 1 dose of fosfomycin 2/9.  Follow-up ultrasound showed resolution of left hydronephrosis.  Therapy evaluations completed due to patient decreased functional mobility was admitted for a comprehensive rehab program.  Patient currently requires mod with mobility secondary to muscle weakness, decreased cardiorespiratoy endurance and decreased oxygen support, decreased memory, and decreased standing balance, decreased postural control, and decreased balance strategies.  Prior to hospitalization, patient was modified independent  with mobility and lived with Alone in a House home.  Home access is 2 steps in front with no rails and 4 steps in the back with 2 rails - plan to get friend to install ramp on front.Stairs to enter.  Patient will benefit from skilled PT intervention to maximize safe functional mobility, minimize fall risk, and decrease caregiver burden for planned discharge home alone.  Anticipate patient will benefit from follow up [place] at discharge.  PT - End of Session Activity Tolerance: Tolerates 30+ min activity with multiple rests Endurance Deficit: Yes Endurance Deficit Description: fatigued after getting to EOB PT  Assessment Rehab Potential (ACUTE/IP ONLY): Fair PT Barriers to Discharge: [place] home environment;Decreased caregiver support;Home environment access/layout;Weight;Hemodialysis;Lack of/limited family support PT Barriers to Discharge Comments: lives alone, has STE but reports plan to get ramp installed, dialysis, generalized weakness/deconditioning PT Patient demonstrates impairments in the following area(s): Balance;Edema;Endurance;Nutrition;Pain;Skin Integrity PT Transfers Functional Problem(s): Bed Mobility;Bed to Chair;Car;Furniture PT Locomotion Functional Problem(s): Ambulation;Wheelchair Mobility;Stairs PT Plan PT Intensity: Minimum of 1-2 x/day ,45 to 90 minutes PT Frequency: 5 out of 7 days PT Duration Estimated Length of Stay: 2 weeks PT Treatment/Interventions: Ambulation/gait training;Discharge planning;Functional mobility training;Psychosocial support;Therapeutic Activities;Balance/vestibular training;Disease management/prevention;Neuromuscular re-education;Skin care/wound management;Therapeutic Exercise;Wheelchair propulsion/positioning;DME/adaptive equipment instruction;Pain management;UE/LE Strength taining/ROM;Community reintegration;Patient/family education;Stair training;UE/LE Coordination activities;Cognitive remediation/compensation;Splinting/orthotics PT Transfers Anticipated Outcome(s): Mod I with LRAD PT Locomotion Anticipated Outcome(s): Mod I with LRAD PT Recommendation Follow Up Recommendations: Home health PT Patient destination: Home Equipment Recommended: To be determined  PT Evaluation Precautions/Restrictions Precautions Precautions: Fall Precaution Comments: monitor O2 Restrictions Weight Bearing Restrictions: No Pain Interference Pain Interference Pain Effect on Sleep: 0. Does not apply - I have not had any pain or hurting in the past 5 days Pain Interference with Therapy Activities: 0. Does not apply - I have not received rehabilitationtherapy in  the past 5 days Pain Interference with Day-to-Day Activities: 1. Rarely or not at all Home Living/Prior [place]: Alone Available Help at Discharge: Friend(s);Available PRN/intermittently Type of Home: House Home Access: Stairs to enter [employer] of Steps: 2 steps in front with  no rails and 4 steps in the back with 2 rails - plan to get friend to install ramp on front. Entrance Stairs-Rails: Right;Left;Can reach both (in back entrance) Home Layout: One level Bathroom Shower/Tub: Multimedia programmer: Handicapped height Bathroom Accessibility: Yes Additional Comments: When pt discharged from last CIR stay, her swelling was so bad she couldn't bend her knees to get in the car or lift her legs to get up the steps.  Lives With: Alone Prior Function Level of Independence: Requires assistive device for independence  Able to Take Stairs?: No (since last CIR stay) Driving: No (since last CIR stay) Vocation: Retired Vision/Perception     Cognition Overall Cognitive Status: Within Functional Limits for tasks assessed Arousal/Alertness: Awake/alert Orientation Level: Oriented X4 Memory: Impaired (pt reports memory impairments since being on dialysis) Awareness: Appears intact Problem Solving: Appears intact Safety/Judgment: Appears intact Sensation Sensation Light Touch: Appears Intact Proprioception: Appears Intact Coordination Gross Motor Movements are Fluid and Coordinated: Yes Fine Motor Movements are Fluid and Coordinated: Yes Coordination and Movement Description: generalized weakness/deconditioning Finger Nose Finger Test: WFL bilaterally but slow Heel Shin Test: decreased ROM bilaterally due to body habitus Motor  Motor Motor: Within Functional Limits Motor - Skilled Clinical Observations: generalized weakness/deconditioning  Trunk/Postural Assessment  Cervical Assessment Cervical Assessment: Exceptions to [facility]  (forward head) Thoracic Assessment Thoracic Assessment: Exceptions to [facility] (rounded shoulders) Lumbar Assessment Lumbar Assessment: Exceptions to [facility] (anterior pelvic tilt due to body habitus) Postural Control Postural Control: Within Functional Limits  Balance Balance Balance Assessed: Yes Static Sitting Balance Static Sitting - Balance Support: Feet supported;Bilateral upper extremity supported Static Sitting - Level of Assistance: 5: Stand by assistance (supervision) Dynamic Sitting Balance Dynamic Sitting - Balance Support: Feet supported;No upper extremity supported Dynamic Sitting - Level of Assistance: 5: Stand by assistance (supervision) Static Standing Balance Static Standing - Balance Support: Bilateral upper extremity supported (RW) Static Standing - Level of Assistance: 5: Stand by assistance (CGA) Dynamic Standing Balance Dynamic Standing - Balance Support: Bilateral upper extremity supported (RW) Dynamic Standing - Level of Assistance: 4: Min assist Dynamic Standing - Comments: with transfers Extremity Assessment  RLE Assessment RLE Assessment: Exceptions to [facility] Active Range of Motion (AROM) Comments: limited due to body habitus General Strength Comments: not formally tested due to time restrictions but grossly 3+/5 LLE Assessment LLE Assessment: Exceptions to [facility] Active Range of Motion (AROM) Comments: limited due to body habitus General Strength Comments: not formally tested due to time restrictions but grossly 3+/5  Care Tool Care Tool Bed Mobility Roll left and right activity   Roll left and right assist level: Moderate Assistance - Patient 50 - 74%    Sit to lying activity        Lying to sitting on side of bed activity   Lying to sitting on side of bed assist level: the ability to move from lying on the back to sitting on the side of the bed with no back support.: Moderate Assistance - Patient 50 - 74%     Care Tool Transfers Sit to stand transfer    Sit to stand assist level: Moderate Assistance - Patient 50 - 74% Sit to stand assistive device: Walker  Chair/bed transfer   Chair/bed transfer assist level: Minimal Assistance - Patient > 75% Chair/bed transfer assistive device: Engineering geologist transfer activity did not occur: Safety/medical concerns (weakness, fatigue, deconditioning)  Care Tool Locomotion Ambulation Ambulation activity did not occur: Safety/medical concerns (weakness, fatigue, deconditioning)        Walk 10 feet activity Walk 10 feet activity did not occur: Safety/medical concerns (weakness, fatigue, deconditioning)       Walk 50 feet with 2 turns activity Walk 50 feet with 2 turns activity did not occur: Safety/medical concerns (weakness, fatigue, deconditioning)      Walk 150 feet activity Walk 150 feet activity did not occur: Safety/medical concerns (weakness, fatigue, deconditioning)      Walk 10 feet on uneven surfaces activity Walk 10 feet on uneven surfaces activity did not occur: Safety/medical concerns (weakness, fatigue, deconditioning)      Stairs Stair activity did not occur: Safety/medical concerns (weakness, fatigue, deconditioning)        Walk up/down 1 step activity Walk up/down 1 step or curb (drop down) activity did not occur: Safety/medical concerns (weakness, fatigue, deconditioning)      Walk up/down 4 steps activity Walk up/down 4 steps activity did not occur: Safety/medical concerns (weakness, fatigue, deconditioning)      Walk up/down 12 steps activity Walk up/down 12 steps activity did not occur: Safety/medical concerns (weakness, fatigue, deconditioning)      Pick up small objects from floor Pick up small object from the floor (from standing position) activity did not occur: Safety/medical concerns (weakness, fatigue, deconditioning)      Wheelchair Is the patient using a wheelchair?: Yes Type of Wheelchair: Manual Wheelchair  activity did not occur: Safety/medical concerns (weakness, fatigue, deconditioning)      Wheel 50 feet with 2 turns activity Wheelchair 50 feet with 2 turns activity did not occur: Safety/medical concerns (weakness, fatigue, deconditioning)    Wheel 150 feet activity Wheelchair 150 feet activity did not occur: Safety/medical concerns (weakness, fatigue, deconditioning)      Refer to Care Plan for Long Term Goals  SHORT TERM GOAL WEEK 1 PT Short Term Goal 1 (Week 1): pt will perform transfer with LRAD and CGA PT Short Term Goal 2 (Week 1): pt will ambulate 95f with LRAD and CGA PT Short Term Goal 3 (Week 1): pt will initate stair training  Recommendations for other services: None   Skilled Therapeutic Intervention Evaluation completed (see details above and below) with education on PT POC and goals and individual treatment initiated with focus on functional mobility/transfers, generalized strengthening, dynamic standing balance/coordination, and improved activity tolerance. Received pt semi-reclined in bed with RN present administering medications. Pt educated on PT evaluation, CIR policies, and therapy schedule and agreeable. Pt denied any pain during session but extremely hyperverbal telling therapist of all the events that [facility]. Provided pt with 22x18 manual WC and SPC (24x18 and bariatric RW not available). Pt on 4L O2 with sats 95% with activity. Pt transferred semi-reclined<>sitting EOB with mod HHA and required min HHA to scoot to EOB. Pt required x 2 attempts and light mod A to stand from elevated EOB with RW. Stand<>pivot bed<>WC with RW and min A with increased time and pt visibly fatigued afterwards. [facility] awaiting upcoming OT session. Notified RN of pt's current transfer status.   Mobility Bed Mobility Bed Mobility: Rolling Right;Supine to Sit Rolling Right: Moderate  Assistance - Patient 50-74% Supine to Sit: Moderate Assistance - Patient 50-74% Transfers Transfers: Sit to Stand;Stand to Sit;Stand Pivot Transfers Sit to Stand: Moderate Assistance - Patient 50-74% Stand to Sit: Contact Guard/Touching assist  Stand Pivot Transfers: Minimal Assistance - Patient > 75% Stand Pivot Transfer Details: Verbal cues for precautions/safety Stand Pivot Transfer Details (indicate cue type and reason): verbal cues for energy conservation and to avoid Valsalva Transfer (Assistive device): Rolling walker Locomotion  Gait Ambulation: No Gait Gait: No Stairs / Additional Locomotion Stairs: No Wheelchair Mobility Wheelchair Mobility: No   Discharge Criteria: Patient will be discharged from PT if patient refuses treatment 3 consecutive times without medical reason, if treatment goals not met, if there is a change in medical status, if patient makes no progress towards goals or if patient is discharged from hospital.  The above assessment, treatment plan, treatment alternatives and goals were discussed and mutually agreed upon: by patient  Alfonse Alpers PT, DPT  03/29/2021, 11:59 AM

## 2021-03-29 NOTE — Progress Notes (Signed)
Patient ID: Nicole Schmidt, female   DOB: 11-19-1965, 56 y.o.   MRN: [id number] Met  with the patient to review current situation, rehab team conference and plan of care. Discussed limitations, and secondary risk management including HF, DM, HTN, HLD and ESRD; HD. Reviewed dietary modifications; patient reports she doesn't cook and puts things together, sandwiches and precooked items. Edema of lower extremity remains but has improved along with hands.  Continue to follow along to discharge to address educational needs, skin care management, and collaborate with the patient and team to facilitate preparation for discharge. Margarito Liner

## 2021-03-29 NOTE — Progress Notes (Signed)
PROGRESS NOTE   Subjective/Complaints: Lower extremity edema improved.  Patient notes loss of consciousness twice on acute care side.  BP currently soft. Decrease tramadol to q8H prn.   ROS: +lower extremity edema, denies pain  Objective:   No results found. Recent Labs    03/27/21 0215 03/28/21 0920  WBC 12.2* 11.4*  HGB 8.4* 8.5*  HCT 29.0* 30.3*  PLT 312 307   Recent Labs    03/28/21 0920 03/28/21 1836  NA 130* 131*  K 3.7 3.9  CL 95* 95*  CO2 27 24  GLUCOSE 125* 153*  BUN 48* 51*  CREATININE 5.32* 5.84*  CALCIUM 9.4 9.4    Intake/Output Summary (Last 24 hours) at 03/29/2021 1211 Last data filed at 03/29/2021 0920 Gross per 24 hour  Intake 360 ml  Output --  Net 360 ml        Physical Exam: Vital Signs Blood pressure (!) 124/59, pulse 70, temperature (!) 97.5 F (36.4 C), temperature source Oral, resp. rate 14, height 5\' 4"  (1.626 m), weight (!) 160.4 kg, last menstrual period 07/17/2012, SpO2 94 %. Gen: no distress, normal appearing HEENT: oral mucosa pink and moist, NCAT Cardio: Reg rate Chest: normal effort, normal rate of breathing Abd: soft, non-distended Skin: Stasis dermatitis bilateral pre tibial  Neurologic: Cranial nerves II through XII intact, motor strength is 5/5 in bilateral deltoid, bicep, tricep, grip, 3-/ 5 hip flexor, knee extensors, ankle dorsiflexor and plantar flexor Sensory exam normal sensation to light touch and proprioception in bilateral upper and lower extremities Cerebellar exam normal finger to nose to finger as well as heel to shin in bilateral upper and lower extremities Musculoskeletal: Full range of motion in all 4 extremities. No joint swelling    Assessment/Plan: 1. Functional deficits which require 3+ hours per day of interdisciplinary therapy in a comprehensive inpatient rehab setting. Physiatrist is providing close team supervision and 24 hour management of  active medical problems listed below. Physiatrist and rehab team continue to assess barriers to discharge/monitor patient progress toward functional and medical goals  Care Tool:  Bathing              Bathing assist       Upper Body Dressing/Undressing Upper body dressing        Upper body assist      Lower Body Dressing/Undressing Lower body dressing            Lower body assist       Toileting Toileting Toileting Activity did not occur (Clothing management and hygiene only): N/A (no void or bm)  Toileting assist Assist for toileting: 2 Helpers     Transfers Chair/bed transfer  Transfers assist     Chair/bed transfer assist level: Minimal Assistance - Patient > 75% Chair/bed transfer assistive device: Programmer, multimedia   Ambulation assist   Ambulation activity did not occur: Safety/medical concerns (weakness, fatigue, deconditioning)          Walk 10 feet activity   Assist  Walk 10 feet activity did not occur: Safety/medical concerns (weakness, fatigue, deconditioning)        Walk 50 feet activity   Assist Walk 50 feet  with 2 turns activity did not occur: Safety/medical concerns (weakness, fatigue, deconditioning)         Walk 150 feet activity   Assist Walk 150 feet activity did not occur: Safety/medical concerns (weakness, fatigue, deconditioning)         Walk 10 feet on uneven surface  activity   Assist Walk 10 feet on uneven surfaces activity did not occur: Safety/medical concerns (weakness, fatigue, deconditioning)         Wheelchair     Assist Is the patient using a wheelchair?: Yes Type of Wheelchair: Manual Wheelchair activity did not occur: Safety/medical concerns (weakness, fatigue, deconditioning)         Wheelchair 50 feet with 2 turns activity    Assist    Wheelchair 50 feet with 2 turns activity did not occur: Safety/medical concerns (weakness, fatigue, deconditioning)        Wheelchair 150 feet activity     Assist  Wheelchair 150 feet activity did not occur: Safety/medical concerns (weakness, fatigue, deconditioning)       Blood pressure (!) 124/59, pulse 70, temperature (!) 97.5 F (36.4 C), temperature source Oral, resp. rate 14, height 5\' 4"  (1.626 m), weight (!) 160.4 kg, last menstrual period 07/17/2012, SpO2 94 %.    Medical Problem List and Plan: 1. Functional deficits secondary to debility/acute on chronic hypercapnia respiratory failure.  Oxygen therapy as directed/BiPAP             -patient may  shower             -ELOS/Goals: 7-10d Mod I goals for mobility and self care   Initial CIR evaluations today 2.  Antithrombotics: -DVT/anticoagulation:  Pharmaceutical: Other (comment) Eliquis             -antiplatelet therapy: N/A 3. Pain from pressure injury: improved, Neurontin 300 mg nightly, decrease tramadol to q8H prn 4. Mood: Melatonin as needed             -antipsychotic agents: N/A 5. Neuropsych: This patient is capable of making decisions on her own behalf. 6. Skin/Wound Care: Routine skin checks 7. Fluids/Electrolytes/Nutrition: Routine in and outs with follow-up chemistries 8.  End-stage renal disease.  Continue hemodialysis as per renal services.  Permacath placement 02/25/2021 9.  Hypotension   Norvasc 5 mg daily, Toprol XL 25 mg daily.  Monitor with increased mobility. Decrease Tramadol to q8H prn.  10.  History of atrial fibrillation/pulmonary emboli/acute on chronic CHF.  Continue Eliquis.  Cardiac rate controlled.  Continue beta-blocker.Monitor for fluid overload 11.  Hypothyroidism.  Synthroid 12.  Hyperlipidemia.  Lipitor 13.  History of chronic anemia. 14.  Diabetes mellitus.  Latest hemoglobin A1c 7.2.  SSI. Placed nursing order for no fluids other than water.  15.  History of hematuria.  Left ureteral stone with recent ureteroscopy/laser lithotripsy.  Repeat ultrasound showed resolution of left hydronephrosis.  Follow-up  Dr.Sninsky. 16.  Morbid obesity.  BMI 62.74.  Dietary follow-up 17.  Acute blood loss anemia/leukocytosis.  Follow-up chemistries 18.  Urine culture positive ESBL/Proteus/E. coli.  Treat empirically with  one dose fosfomycin 2/9.  Contact precautions. 19.Leukocytosis.  WBC improving.  Continue to monitor weekly. Follow-up per infectious disease Dr.Ravisankar and presently maintained on IV Invanz x 4 does.  LOS: 1 days A FACE TO FACE EVALUATION WAS PERFORMED  Clide Deutscher Nicole Schmidt 03/29/2021, 12:11 PM

## 2021-03-29 NOTE — Progress Notes (Signed)
[facility] [place] Individual Statement of Services  Patient Name:  Nicole Schmidt  Date:  03/29/2021  Welcome to the [facility].  Our goal is to provide you with an individualized program based on your diagnosis and situation, designed to meet your specific needs.  With this comprehensive rehabilitation program, you will be expected to participate in at least 3 hours of rehabilitation therapies Monday-Friday, with modified therapy programming on the weekends.  Your rehabilitation program will include the following services:  Physical Therapy (PT), Occupational Therapy (OT), Speech Therapy (ST), 24 hour per day rehabilitation nursing, Therapeutic Recreaction (TR), Neuropsychology, Care Coordinator, Rehabilitation Medicine, Nutrition Services, Pharmacy Services, and Other  Weekly team conferences will be held on Wednesdays to discuss your progress.  Your Inpatient Rehabilitation Care Coordinator will talk with you frequently to get your input and to update you on team discussions.  Team conferences with you and your family in attendance may also be held.  Expected length of stay:  MOD I  Overall anticipated outcome:  7-10 Days  Depending on your progress and recovery, your program may change. Your Inpatient Rehabilitation Care Coordinator will coordinate services and will keep you informed of any changes. Your Inpatient Rehabilitation Care Coordinator's name and contact numbers are listed  below.  The following services may also be recommended but are not provided by the [facility]:   [place] will be made to provide these services after discharge if needed.  Arrangements include referral to agencies that provide these services.  Your insurance has been verified to be:   [employer] Your primary doctor is:  N/A  Pertinent information will be shared with your  doctor and your insurance company.  Inpatient Rehabilitation Care Coordinator:  Erlene Quan, [place] or [phone]  Information discussed with and copy given to patient by: Dyanne Iha, 03/29/2021, 12:14 PM

## 2021-03-29 NOTE — Progress Notes (Signed)
Inpatient Rehabilitation  Patient information reviewed and entered into eRehab system by Azaylia Fong Brixton Schnapp, OTR/L.   Information including medical coding, functional ability and quality indicators will be reviewed and updated through discharge.    

## 2021-03-29 NOTE — Progress Notes (Signed)
Inpatient Rehabilitation Care Coordinator Assessment and Plan Patient Details  Name: Nicole Schmidt MRN: [id number] Date of Birth: 01-21-1966  Today's Date: 03/29/2021  Hospital Problems: Principal Problem:   Debility  Past Medical History:  Past Medical History:  Diagnosis Date   (HFpEF) heart failure with preserved ejection fraction ([place])    a. 10/2018 Echo: EF 60-65%, diast dysfxn, RVSP 50.7 mmHg, mildly dil LA.   Acquired hypothyroidism 02/09/2020   Anemia    Arthritis    Breast cancer ([place]) 2014   right breast cancer   Breast cancer of upper-inner quadrant of right female breast ([place])    Right breast invasive CA and DCIS , 7 mm T1,N0,M0. Er/PR pos, her 2 negative.  Margins 1 mm.   CHF (congestive heart failure) (HCC)    CKD (chronic kidney disease), stage III (HCC)    Diabetes mellitus type 2 in obese ([place]) 02/09/2020   Diabetes mellitus without complication ([place])    Dyslipidemia 02/09/2020   Essential hypertension    Gout    Hypothyroidism    Menopause    age 56   Morbid obesity ([place])    Persistent atrial fibrillation ([place])    a. Dx 10/2018 in setting of PNA. CHA2DS2VASc = 4-->Eliquis; b. 11/2018 s/p successful DCCV.   Personal history of radiation therapy    Pulmonary embolism ([place]) 10/2014   a. Chronic eliquis.   Thyroid goiter    Past Surgical History:  Past Surgical History:  Procedure Laterality Date   BREAST BIOPSY Right 2014   breast ca   BREAST EXCISIONAL BIOPSY Right 07/19/2012   breast ca   BREAST SURGERY Right 2014   with sentinel node bx subareaolar duct excision   CARDIOVERSION N/A 11/28/2018   Procedure: CARDIOVERSION;  Surgeon: Minna Merritts, MD;  Location: [facility] ORS;  Service: Cardiovascular;  Laterality: N/A;   COLONOSCOPY WITH PROPOFOL N/A 01/30/2017   Procedure: COLONOSCOPY WITH PROPOFOL;  Surgeon: Christene Lye, MD;  Location: [facility] ENDOSCOPY;  Service: Endoscopy;  Laterality: N/A;   CYSTOSCOPY W/ URETERAL STENT PLACEMENT Left  02/14/2021   Procedure: CYSTOSCOPY WITH RETROGRADE PYELOGRAM/URETERAL STENT PLACEMENT;  Surgeon: Billey Co, MD;  Location: [facility] ORS;  Service: Urology;  Laterality: Left;   DIALYSIS/PERMA CATHETER INSERTION N/A 02/24/2021   Procedure: DIALYSIS/PERMA CATHETER INSERTION;  Surgeon: Algernon Huxley, MD;  Location: [facility] LAB;  Service: Cardiovascular;  Laterality: N/A;   Social History:  reports that she has never smoked. She has never used smokeless tobacco. She reports that she does not drink alcohol and does not use drugs.  Family / Support Systems Other Supports: Ellery Plunk and Altha Harm Anticipated Caregiver: susie Caregiver Availability: Intermittent Family Dynamics: support from 2 family members  Social History Preferred language: English Religion: [employer] - How often do you need to have someone help you when you read instructions, pamphlets, or other written material from your doctor or pharmacy?: Never Legal History/Current Legal Issues: n/a Guardian/Conservator: n/a   Abuse/Neglect Abuse/Neglect Assessment Can Be Completed: Yes Physical Abuse: Denies Verbal Abuse: Denies Sexual Abuse: Denies Exploitation of patient/patient's resources: Denies Self-Neglect: Denies  Patient response to: Social Isolation - How often do you feel lonely or isolated from those around you?: Never  Emotional Status Recent Psychosocial Issues: coping Psychiatric History: n/a Substance Abuse History: n/a  Patient / Family Perceptions, Expectations & Goals Pt/Family understanding of illness & functional limitations: yes Premorbid pt/family roles/activities: patient previosuly independent Anticipated changes in roles/activities/participation: anticipating MOD I Pt/family expectations/goals: anticipating MOD  I goals  Recruitment consultant: None Premorbid Home Care/DME Agencies: Other (Comment) (single point cane, rollator, rw, BSC,  WC) Transportation available at discharge: friends intermittently Is the patient able to respond to transportation needs?: Yes In the past 12 months, has lack of transportation kept you from medical appointments or from getting medications?: No In the past 12 months, has lack of transportation kept you from meetings, work, or from getting things needed for daily living?: No Resource referrals recommended: Neuropsychology  Discharge Planning Living Arrangements: Alone Support Systems: Other relatives, Friends/neighbors Type of Residence: Private residence Insurance Resources: Multimedia programmer (specify) ([place]) Financial Resources: Radio broadcast assistant Screen Referred: No Living Expenses: Own Money Management: Patient Does the patient have any problems obtaining your medications?: No Home Management: independent Patient/Family Preliminary Plans: anticipating MOD I goals Care Coordinator Barriers to Discharge: Insurance for SNF coverage, Lack of/limited family support Care Coordinator Anticipated Follow Up Needs: HH/OP  Clinical Impression Patient previously admitted. Anticipating MOD I goals due to intermittent assistance at d/c. Patient [place] established (Medi). Family plans to build a ramp and arrange transportation to OP HD(discussed on 2/9)  Dyanne Iha 03/29/2021, 12:12 PM

## 2021-03-29 NOTE — Progress Notes (Signed)
Occupational Therapy Session Note  Patient Details  Name: Nicole Schmidt MRN: [id number] Date of Birth: [birth year]  Today's Date: 03/29/2021 OT Individual Time: 1050-1200 OT Individual Time Calculation (min): 70 min    Short Term Goals: Week 1:  OT Short Term Goal 1 (Week 1): STG = LTG 2/2 ELOS  Skilled Therapeutic Interventions/Progress Updates:  Skilled OT intervention completed with focus on toilet transfers, functional endurance and transfers. Pt received seated in w/c, agreeable to session. Pt extensively shared her PMH, and events of being readmitted to [facility] since d/c 2 weeks ago. Pt did not have bariatric BSC in room for toileting, so therapist located one for pt, as well as [place] cord extender for ease of toilet transfers. Pt participated in mass practice sit <> stands with progression of device used, as pt has sensory ataxia and learned dependency on having device in hand. Education provided on pt's low endurance level, as well as rehab goals to progressing with LRAD as possible. Pt able to stand with fading assist of min to CGA with hand held assist. At first, pt with slight LOB, however improved with static standing tolerance to increase pt's confidence. Pt very insistent on using RW, however agreeable with lots of encouragement to try [place] first. Pt able to complete small toe touches forward on BLEs with CGA for balance, however pt with fear of falling and dependency on [facility] and HHA. Pt required seated rest breaks, with <1 min tolerance in standing due to decreased endurance. Pt able to take a couple steps with min A to BBSC, using SPC and HHA, however pt with c/o feeling unsteady. Therapist provided pt a RW for small steps/stand pivot back to w/c with pt at CGA level. Updated pt's safety sheet for toilet transfers to +1 assist with RW stand pivot. Pt was left seated in w/c, with chair alarm on and all needs in reach at end of session.  O2 sats as follows during session: Pt received with [place]  donned, at 100% O2 sat on 4 L seated and at rest  94% on 4 L standing for about 1 min 91% on 4 L after stand pivot   Therapy Documentation Precautions:  Restrictions Weight Bearing Restrictions: No  Pain: No c/o pain   Therapy/Group: Individual Therapy  Asah Lamay E Dutch Ing 03/29/2021, 7:37 AM

## 2021-03-30 LAB — GLUCOSE, CAPILLARY
Glucose-Capillary: 100 mg/dL — ABNORMAL HIGH (ref 70–99)
Glucose-Capillary: 90 mg/dL (ref 70–99)
Glucose-Capillary: 119 mg/dL — ABNORMAL HIGH (ref 70–99)

## 2021-03-30 LAB — HEPATITIS B SURFACE ANTIBODY, QUANTITATIVE: Hep B S AB Quant (Post): <3.1 m[IU]/mL — ABNORMAL LOW (ref >9.9)

## 2021-03-30 MED ORDER — TRAMADOL HCL 50 MG PO TABS
50.0000 mg | ORAL_TABLET | Freq: Two times a day (BID) | ORAL | Status: DC | PRN
  Administered 2021-04-01 – 2021-04-04 (×2): 50 mg via ORAL
  Filled 2021-03-30 (×2): qty 1

## 2021-03-30 MED ORDER — DARBEPOETIN ALFA 100 MCG/0.5ML IJ SOSY
100.0000 ug | PREFILLED_SYRINGE | INTRAMUSCULAR | Status: DC
  Administered 2021-04-07 – 2021-04-14 (×2): 100 ug via INTRAVENOUS
  Filled 2021-03-30 (×4): qty 0.5

## 2021-03-30 MED ORDER — NEOMYCIN-POLYMYXIN-HC 1 % OT SOLN
3.0000 [drp] | Freq: Three times a day (TID) | OTIC | Status: DC
  Filled 2021-03-30: qty 10

## 2021-03-30 MED ORDER — NEOMYCIN-POLYMYXIN-HC 3.5-10000-1 OT SUSP
3.0000 [drp] | Freq: Three times a day (TID) | OTIC | Status: DC
  Administered 2021-03-30 – 2021-04-06 (×16): 3 [drp] via OTIC
  Filled 2021-03-30: qty 10

## 2021-03-30 MED ORDER — DARBEPOETIN ALFA 100 MCG/0.5ML IJ SOSY
100.0000 ug | PREFILLED_SYRINGE | Freq: Once | INTRAMUSCULAR | Status: AC
  Administered 2021-03-30: 100 ug via SUBCUTANEOUS
  Filled 2021-03-30: qty 0.5
  Filled 2021-03-30: qty 1.68

## 2021-03-30 NOTE — Progress Notes (Addendum)
Called respiratory as pt is verbalizing that the BPAP machine is not comfortable and "something is wrong with it". Attempts by this nurse were made to fix the mask on the pt's face. Pt still not satisfied with how the mask feels.

## 2021-03-30 NOTE — Progress Notes (Signed)
Physical Therapy Session Note  Patient Details  Name: Nicole Schmidt MRN: [id number] Date of Birth: 1965-04-10  Today's Date: 03/30/2021 PT Individual Time: 1779-3903 PT Individual Time Calculation (min): 57 min   Short Term Goals: Week 1:  PT Short Term Goal 1 (Week 1): pt will perform transfer with LRAD and CGA PT Short Term Goal 2 (Week 1): pt will ambulate 60ft with LRAD and CGA PT Short Term Goal 3 (Week 1): pt will initate stair training  Skilled Therapeutic Interventions/Progress Updates:   Received pt sitting in recliner, pt agreeable to PT treatment, and denied any pain during session. Pt on 4L O2 via [place] with O2 sats 100%, decreased to 3L during session with O2 sats >89% with activity. Session with emphasis on functional mobility/transfers, toileting, generalized strengthening and endurance, dynamic standing balance/coordination, and gait training. Pt required x 2 attempts and min A to stand from recliner and ambulated 29ft with RW and CGA (min A to navigate threshold) into bathroom. Pt required assist for clothing management and able to void minimally. Pt required increased time sitting on commode to rest/recover after walking and performed peri-care with supervision. Pt required x 2 attempts and light mod A to stand from regular toilet and ambulated 14ft with RW and CGA to WC. Sit<>stand with RW and CGA x 3 additional trials and performed the following exercises with CGA and verbal cues for technique: -standing marches 2x10 bilaterally - pt barely able to clear either LE from floor -standing heel raises 2x12 - pt unable to clear heels from floor so instead worked on anterior weight shifting onto toes to activate intrinsic ankle muscles -standing hip abduction x10 bilaterally Pt ambulated 63ft with RW and CGA back to recliner and reported 6/10 fatigue - unable to perform another set of standing hip abduction. Concluded session with pt sitting in recliner with all needs within reach  awaiting next OT session. Pt left on 3L O2 via [place] with O2 sat 98-100%.   Therapy Documentation Precautions:  Precautions Precautions: Fall Precaution Comments: monitor O2 Restrictions Weight Bearing Restrictions: No  Therapy/Group: Individual Therapy Alfonse Alpers PT, DPT   03/30/2021, 7:48 AM

## 2021-03-30 NOTE — Progress Notes (Signed)
[facility] Progress Note    Assessment/ Plan:   Assessment/Plan:  Debility - following prolonged hospitalizations.  In CIR.   Leukocytosis - resolved.  Likely due to #3.  Recent ESBL UTI/Hematuria/L ureteral stone sp stent placement on 02/14/21 -  Repeat US with resolution for hydronephrosis.  F/u urology at OP. On ertapenem until 2/16 Chronic respiratory failure/hypercapnic respiratory failure - on 2L O2 prior to last admission. Using BiPAP while sleeping with noted improvement.  ESRD -  on HD TTS. Can not come up to HD on BiPAP so avoid night time dialysis. Next HD 2/16  Hypertension/volume  - Blood pressure well controlled.  Hypotension limits UF, may need to add midodrine for BP support.  Use albumin. Continue home meds.  Volume status improving, continue to titrate volume as tolerated.   Anemia of CKD - Hgb 8.1. Ordered Aranesp 128mcg qwk  Secondary Hyperparathyroidism -  Calcium and phos in goal.  Continue VDRA.    Nutrition - Renal diet w/fluid restrictions.  Hx A fib - on eliquis. DMT2 - per PMD Outpatient Dialysis Orders:  [facility] TTS [address] RIJ permcath  10000 unit EPO qHD Calcitriol 0.25mg  qd  Gean Quint, MD [facility]  Subjective:   No acute events, tolerated HD yesterday, net UF 2L. No complaints   Objective:   BP (!) 113/51 (BP Location: Left Arm)    Pulse 72    Temp 97.9 F (36.6 C) (Oral)    Resp 18    Ht 5\' 4"  (1.626 m)    Wt (!) 160 kg    LMP 07/17/2012    SpO2 99%    BMI 60.55 kg/m   Intake/Output Summary (Last 24 hours) at 03/30/2021 3419 Last data filed at 03/29/2021 2300 Gross per 24 hour  Intake 360 ml  Output 2000 ml  Net -1640 ml   Weight change: 0.5 kg  Physical Exam: Gen:nad CVS:rrr, s1s2 Resp:normal wob FXT:KWIOX, soft Ext:1+ pitting edema b/l Les Neuro: awake, alert, speech clear and coherent, moves all ext spontaneously Dialysis access: RIJ TDC c/d/i  Imaging: No results  found.  Labs: BMET Recent Labs  Lab 03/24/21 0316 03/25/21 0350 03/26/21 0153 03/27/21 0215 03/28/21 0920 03/28/21 1836  NA  --   --  131*  --  130* 131*  K  --   --  4.7  --  3.7 3.9  CL  --   --  94*  --  95* 95*  CO2  --   --  26  --  27 24  GLUCOSE  --   --  98  --  125* 153*  BUN  --   --  46*  --  48* 51*  CREATININE  --   --  4.91*  --  5.32* 5.84*  CALCIUM  --   --  9.2  --  9.4 9.4  PHOS 5.1* 4.1 6.5* 3.2 4.9* 4.8*   CBC Recent Labs  Lab 03/25/21 1026 03/27/21 0215 03/28/21 0920 03/29/21 1432  WBC 18.8* 12.2* 11.4* 10.3  HGB 8.7* 8.4* 8.5* 8.1*  HCT 31.0* 29.0* 30.3* 29.0*  MCV 99.0 96.3 98.4 98.0  PLT 277 312 307 300    Medications:     amLODipine  5 mg Oral Daily   apixaban  5 mg Oral BID   atorvastatin  20 mg Oral QHS   bisacodyl  10 mg Rectal Daily   calcitRIOL  0.25 mcg Oral Daily   calcium carbonate  1,250 mg Oral  Q breakfast   chlorhexidine  15 mL Mouth Rinse BID   Chlorhexidine Gluconate Cloth  6 each Topical Q0600   Darbepoetin Alfa  100 mcg Intravenous Q Tue-HD   famotidine  10 mg Oral Daily   feeding supplement (NEPRO CARB STEADY)  237 mL Oral BID BM   folic acid  6,837 mcg Oral Daily   gabapentin  300 mg Oral QHS   Gerhardt's butt cream  3 application Topical TID   insulin aspart  0-15 Units Subcutaneous TID WC   levothyroxine  112 mcg Oral QAC breakfast   mouth rinse  15 mL Mouth Rinse q12n4p   metoprolol succinate  25 mg Oral Daily   multivitamin  1 tablet Oral QHS   sevelamer carbonate  800 mg Oral TID WC   Vitamin D (Ergocalciferol)  50,000 Units Oral Weekly      Gean Quint, MD [facility] 03/30/2021, 8:53 AM

## 2021-03-30 NOTE — Progress Notes (Signed)
Visit made to patients room to discuss what time she wanted CPAP.  Patient states she wants to watch TV and will advise when she is ready.

## 2021-03-30 NOTE — Progress Notes (Signed)
Occupational Therapy Session Note  Patient Details  Name: Nicole Schmidt MRN: [id number] Date of Birth: October 25, 1965  Today's Date: 03/30/2021 OT Individual Time: 6283-6629 & 1500-1530 OT Individual Time Calculation (min): 53 min & 30 min   Short Term Goals: Week 1:  OT Short Term Goal 1 (Week 1): STG = LTG 2/2 ELOS  Skilled Therapeutic Interventions/Progress Updates:  Session 1 Skilled OT intervention completed with focus on ambulatory endurance, BUE endurance, self-care. Pt received seated in recliner, agreeable to session. Pt reporting increased fatigue from session with OT where she "walked to the bathroom, but had to use w/c back to recliner." Therapist utilizing motivation of hair washing at sink to encourage pt to walk for participation, with pt agreeable. Pt able to sit > stand without AD with CGA, then ambulate with CGA using RW per pt compliance vs HHA, about 12 ft to sink, and 12 ft back to recliner at same assist. Therapist used shower tray to wash pt's hair at total A, however pt completed hair drying seated for BUE endurance. Educated pt on ambulatory endurance, ramp installation at home. Pt left seated in recliner, with chair alarm on and all needs in reach at end of session.  O2 sats on 4 L as follows during session: Upon arrival, pt seated at rest- 100% After walk to chair at sink- 90%, improved with breathing technique After walk back to recliner- 93%  Session 2 Skilled OT intervention completed with focus on BUE strengthening, O2 weaning. Pt received seated in recliner, agreeable to session. Pt participated in the following exercises to promote BUE strengthening and functional endurance needed for self care tasks:  (3 pound dowel) Chest presses 2x20 Overhead presses 2x20, with pt having to stop intermittently during set due to fatigue Bicep flexion 2x20 Russian twists 2x20  Pt required cues for form and technique throughout. Pt left seated in recliner, with chair alarm  on, BLEs elevated per MD order and and all needs in reach at end of session.  O2 sats as follows during session: Seated at rest, upon arrival, 100% on 3L In between exercises, 97%, 2.5 L At end of session, 98%, 2.5 L   Therapy Documentation Precautions:  Precautions Precautions: Fall Precaution Comments: monitor O2 Restrictions Weight Bearing Restrictions: No  Pain: No c/o pain   Therapy/Group: Individual Therapy  Silas Muff E [name] 03/30/2021, 7:51 AM

## 2021-03-30 NOTE — Progress Notes (Signed)
°   03/30/21 0214  BiPAP/CPAP/SIPAP  $ Non-Invasive Ventilator  Non-Invasive Vent Subsequent   Placed pt. Back on cpap

## 2021-03-30 NOTE — Progress Notes (Addendum)
Occupational Therapy Session Note  Patient Details  Name: Nicole Schmidt MRN: [id number] Date of Birth: Apr 25, 1965  Today's Date: 03/30/2021 OT Individual Time: 0354-6568 OT Individual Time Calculation (min): 60 min    Short Term Goals: Week 1:  OT Short Term Goal 1 (Week 1): STG = LTG 2/2 ELOS   Skilled Therapeutic Interventions/Progress Updates:    Pt semi reclined in bed, no c/o pain, requesting to try to use the bathroom.  Pts SPO2 on 3L remained at 97% or greater throughout session..  Pt requesting to use RW during functional mobility and reporting feeling anxiety at baseline when walking in "wide open spaces".  Pt completed supine to sit using bed features with HOB elevated with CGA and Vcs for hand placement.  Sit to stand with min assist for initial power up.  Pt ambulated to toilet using RW with CGA and increased time due to short step length.  Pt completed toilet transfer with min assist using grab bar.  Continent of urine and completed pericare and clothing management with supervision.  Stand pivot with min assist to w/c due to fatigue reported. Transported close to recliner and completed stand pivot w/c to recliner with min assist for initial power up.  BLE elevated, call bell in reach, seat alarm on.    Therapy Documentation Precautions:  Precautions Precautions: Fall Precaution Comments: monitor O2 Restrictions Weight Bearing Restrictions: No   Therapy/Group: Individual Therapy  Ezekiel Slocumb 03/30/2021, 1:06 PM

## 2021-03-30 NOTE — Progress Notes (Signed)
Patient ID: Nicole Schmidt, female   DOB: 04/17/1965, 55 y.o.   MRN: [id number] ° °Team Conference Report to Patient/Family ° °Team Conference discussion was reviewed with the patient and caregiver, including goals, any changes in plan of care and target discharge date.  Patient and caregiver express understanding and are in agreement.  The patient has a target discharge date of 04/13/21. ° °Sw met with patient and provided conference updates. Patient plans to return home with intermittent assistance for cousins. Patient aware medically transportation required for d/c. No additional questions or concerns. °Christina J Baskerville °03/30/2021, 3:09 PM  °

## 2021-03-30 NOTE — Progress Notes (Signed)
PROGRESS NOTE   Subjective/Complaints: She has no new complaints this morning Leukocytosis resolved yesterday Lower extremity edema improved Denies pain  ROS: +lower extremity edema, denies pain, SOB  Objective:   No results found. Recent Labs    03/28/21 0920 03/29/21 1432  WBC 11.4* 10.3  HGB 8.5* 8.1*  HCT 30.3* 29.0*  PLT 307 300   Recent Labs    03/28/21 0920 03/28/21 1836  NA 130* 131*  K 3.7 3.9  CL 95* 95*  CO2 27 24  GLUCOSE 125* 153*  BUN 48* 51*  CREATININE 5.32* 5.84*  CALCIUM 9.4 9.4    Intake/Output Summary (Last 24 hours) at 03/30/2021 0925 Last data filed at 03/29/2021 2300 Gross per 24 hour  Intake 240 ml  Output 2000 ml  Net -1760 ml        Physical Exam: Vital Signs Blood pressure (!) 113/51, pulse 72, temperature 97.9 F (36.6 C), temperature source Oral, resp. rate 18, height _0  (1.626 m), weight (!) 160 kg, last menstrual period 07/17/2012, SpO2 99 %. Gen: no distress, normal appearing, BMI 60.55 HEENT: oral mucosa pink and moist, NCAT Cardio: Reg rate Chest: normal effort, normal rate of breathing Abd: soft, non-distended Skin: Stasis dermatitis bilateral pre tibial  Neurologic: Cranial nerves II through XII intact, motor strength is 5/5 in bilateral deltoid, bicep, tricep, grip, 3-/ 5 hip flexor, knee extensors, ankle dorsiflexor and plantar flexor Sensory exam normal sensation to light touch and proprioception in bilateral upper and lower extremities Cerebellar exam normal finger to nose to finger as well as heel to shin in bilateral upper and lower extremities Musculoskeletal: Full range of motion in all 4 extremities. No joint swelling    Assessment/Plan: 1. Functional deficits which require 3+ hours per day of interdisciplinary therapy in a comprehensive inpatient rehab setting. Physiatrist is providing close team supervision and 24 hour management of active medical  problems listed below. Physiatrist and rehab team continue to assess barriers to discharge/monitor patient progress toward functional and medical goals  Care Tool:  Bathing    Body parts bathed by patient: Right arm, Left arm, Chest, Abdomen, Front perineal area, Right upper leg, Left upper leg, Face (Patient was able to simulate bathing UB, but was met with challenges for LB funciton for lower legs and feet.)   Body parts bathed by helper: Right lower leg, Left lower leg, Buttocks (Areas the pt was unable to address)     Bathing assist Assist Level:  (pt would benefit from additional training with A/E) Assistive Device Comment: Long handled sponge, seated bathing (patient would benefit from long handle sponge and is reported as having a raise toilet already in place.)   Upper Body Dressing/Undressing Upper body dressing   What is the patient wearing?: Pull over shirt    Upper body assist Assist Level: Minimal Assistance - Patient > 75% (secondary to O2 canal placement.)    Lower Body Dressing/Undressing Lower body dressing      What is the patient wearing?: Pants, Underwear/pull up     Lower body assist Assist for lower body dressing: Maximal Assistance - Patient 25 - 49% (Secondary to fatigue and challenges with active range)  Assistive Device Comment: with reacher (Patient would benefit from additional practice)   Toileting Toileting Toileting Activity did not occur Landscape architect and hygiene only): Refused  Toileting assist Assist for toileting:  (Patient is able to stand using the RW for balance for toilet transfer with assistance X1 to aid with clothing management.)     Transfers Chair/bed transfer  Transfers assist  Chair/bed transfer activity did not occur: Refused (additional assessing needed in this area)  Chair/bed transfer assist level: Minimal Assistance - Patient > 75% Chair/bed transfer assistive device: Programmer, multimedia   Ambulation  assist   Ambulation activity did not occur: Refused (additional assessing needed in this area)          Walk 10 feet activity   Assist  Walk 10 feet activity did not occur: Safety/medical concerns (weakness, fatigue, deconditioning)        Walk 50 feet activity   Assist Walk 50 feet with 2 turns activity did not occur: Safety/medical concerns (weakness, fatigue, deconditioning)         Walk 150 feet activity   Assist Walk 150 feet activity did not occur: Safety/medical concerns (weakness, fatigue, deconditioning)         Walk 10 feet on uneven surface  activity   Assist Walk 10 feet on uneven surfaces activity did not occur: Safety/medical concerns (weakness, fatigue, deconditioning)         Wheelchair     Assist Is the patient using a wheelchair?: Yes Type of Wheelchair: Manual Wheelchair activity did not occur: Safety/medical concerns (weakness, fatigue, deconditioning)         Wheelchair 50 feet with 2 turns activity    Assist    Wheelchair 50 feet with 2 turns activity did not occur: Safety/medical concerns (weakness, fatigue, deconditioning)       Wheelchair 150 feet activity     Assist  Wheelchair 150 feet activity did not occur: Safety/medical concerns (weakness, fatigue, deconditioning)       Blood pressure (!) 113/51, pulse 72, temperature 97.9 F (36.6 C), temperature source Oral, resp. rate 18, height _0  (1.626 m), weight (!) 160 kg, last menstrual period 07/17/2012, SpO2 99 %.    Medical Problem List and Plan: 1. Functional deficits secondary to debility/acute on chronic hypercapnia respiratory failure.  Oxygen therapy as directed/BiPAP             -patient may  shower             -ELOS/Goals: 7-10d Mod I goals for mobility and self care   -Interdisciplinary Team Conference today   2.  Antithrombotics: -DVT/anticoagulation:  Pharmaceutical: Other (comment) Eliquis             -antiplatelet therapy: N/A 3. Pain  from pressure injury: improved, Neurontin 300 mg nightly, decrease tramadol to q12H prn 4. Mood: Melatonin as needed             -antipsychotic agents: N/A 5. Neuropsych: This patient is capable of making decisions on her own behalf. 6. Skin/Wound Care: Routine skin checks 7. Fluids/Electrolytes/Nutrition: Routine in and outs with follow-up chemistries 8.  End-stage renal disease.  Continue hemodialysis as per renal services.  Permacath placement 02/25/2021 9.  Hypotension   Norvasc 5 mg daily, Toprol XL 25 mg daily.  Monitor with increased mobility. Decrease Tramadol to q8H prn.  10.  History of atrial fibrillation/pulmonary emboli/acute on chronic CHF.  Continue Eliquis.  Cardiac rate controlled.  Continue beta-blocker.Monitor for fluid overload 11.  Hypothyroidism.  Synthroid 12.  Hyperlipidemia.  Lipitor 13.  History of chronic anemia. 14.  Diabetes mellitus.  Latest hemoglobin A1c 7.2.  SSI. Placed nursing order for no fluids other than water.  15.  History of hematuria.  Left ureteral stone with recent ureteroscopy/laser lithotripsy.  Repeat ultrasound showed resolution of left hydronephrosis.  Follow-up Dr.Sninsky. 16.  Morbid obesity.  BMI 62.74-->60.55. Provided dietary education.  17.  Acute blood loss anemia/leukocytosis.  Follow-up chemistries 18.  Urine culture positive ESBL/Proteus/E. coli.  Treat empirically with  one dose fosfomycin 2/9.  Contact precautions. Will determine how long she needs precautions 19.Leukocytosis.  Resolved, Continue to monitor w/ HD. Follow-up per infectious disease Dr.Ravisankar and presently maintained on IV Invanz x 4 does.  LOS: 2 days A FACE TO FACE EVALUATION WAS PERFORMED  Martha Clan P Desa Rech 03/30/2021, 9:25 AM

## 2021-03-30 NOTE — Patient Care Conference (Signed)
Inpatient RehabilitationTeam Conference and Plan of Care Update Date: 03/30/2021   Time: 11:40 AM    Patient Name: Nicole Schmidt      Medical Record Number: [id number]  Date of Birth: 1965-11-16 Sex: Female         Room/Bed: 4W06C/4W06C-01 Payor Info: Payor: [place] / Plan: [employer] COMM PPO / Product Type: *No Product type* /    Admit Date/Time:  03/28/2021  2:47 PM  Primary Diagnosis:  [facility] Problems: Principal Problem:   Debility    Expected Discharge Date: Expected Discharge Date: 04/13/21  Team Members Present: Physician leading conference: Dr. Leeroy Cha Social Worker Present: Erlene Quan, BSW Nurse Present: Dorien Chihuahua, RN PT Present: Becky Sax, PT OT Present: Other (comment) [facility] Alphonsa Gin, [place]) [place] Coordinator present : Gunnar Fusi, SLP     Current Status/Progress Goal Weekly Team Focus  Bowel/Bladder   pt continent of B/B. Pt oliguric. LBM 03/28/2021  Regular BMs every 3 days or less  Assess B/B every shift and PRN   Swallow/Nutrition/ Hydration             ADL's   min A UBD, Max A LB dressing, mod A bathing, min A toileting, min A stand pivot with HHA to BBSC  mod I  functional endurance, breathing techniques, ADL retraining   Mobility   bed mobility mod A, transfers with RW mod A  mod I  functional mobility/transfers, generalized strengthening and endurance, dynamic standing balance, gait training, stair traning, D/C planning   Communication             Safety/Cognition/ Behavioral Observations            Pain   Pt denies pain  pain <3/10  Assess pain every shift and PRN   Skin   Reddened and edematous BLE.  no new breakdown  Assess skin every shift and PRN     Discharge Planning:  Discharging home with PRN assistance. Lack of assistance/transportation   Team Discussion: Patient with decreased edema in bil lower extremities but remains tender; elevation and ice for treatment. Skin issues addressed with pain  management.   Patient on target to meet rehab goals: yes, currently able to stand with HHA and min - mod assist for transfers. Needs min assist for upper body care and mod assist for lower body bathing and dressing. Goals for discharge set for mod I overall.  *See Care Plan and progress notes for long and short-term goals.   Revisions to Treatment Plan:  N/A   Teaching Needs: Safety, transfers, toileting, medication management, secondary risk management, skin care, etc  Current Barriers to Discharge: Home enviroment access/layout and Lack of/limited family support  Possible Resolutions to Barriers: Family education with cousin PTAR transportation to home [facility] follow up services     Medical Summary Current Status: HD, significant bilateral lower extremityy edema with pain and tightness, labile blood pressure, pain, leukocytosis, hyponatremia, ESBL, hypoxia  Barriers to Discharge: Medical stability;Hemodialysis;Weight  Barriers to Discharge Comments: HD, significant bilateral lower extremityy edema with pain and tightness, labile blood pressure, pain, leukocytosis, hyponatremia, ESBL, hypoxia Possible Resolutions to [employer] Focus: continue HD three times per week, decrease tramadol as tolerated, provide dietary education, continue to monitor WBC, Hgb with HD labs   Continued Need for Acute Rehabilitation Level of Care: The patient requires daily medical management by a physician with specialized training in physical medicine and rehabilitation for the following reasons: Direction of a multidisciplinary physical rehabilitation program to  maximize functional independence : Yes Medical management of patient stability for increased activity during participation in an intensive rehabilitation regime.: Yes Analysis of laboratory values and/or radiology reports with any subsequent need for medication adjustment and/or medical intervention. : Yes   I attest that I was present, lead the  team conference, and concur with the assessment and plan of the team.   Dorien Chihuahua B 03/30/2021, 4:26 PM

## 2021-03-31 ENCOUNTER — Other Ambulatory Visit: Payer: Self-pay | Admitting: Physician Assistant

## 2021-03-31 LAB — RENAL FUNCTION PANEL
Albumin: 2.7 g/dL — ABNORMAL LOW (ref 3.5–5.0)
Anion gap: 9 (ref 5–15)
BUN: 45 mg/dL — ABNORMAL HIGH (ref 6–20)
CO2: 27 mmol/L (ref 22–32)
Calcium: 10.6 mg/dL — ABNORMAL HIGH (ref 8.9–10.3)
Chloride: 93 mmol/L — ABNORMAL LOW (ref 98–111)
Creatinine, Ser: 5.36 mg/dL — ABNORMAL HIGH (ref 0.44–1.00)
GFR, Estimated: 9 mL/min — ABNORMAL LOW (ref >60)
Glucose, Bld: 117 mg/dL — ABNORMAL HIGH (ref 70–99)
Phosphorus: 5.9 mg/dL — ABNORMAL HIGH (ref 2.5–4.6)
Potassium: 4.2 mmol/L (ref 3.5–5.1)
Sodium: 129 mmol/L — ABNORMAL LOW (ref 135–145)

## 2021-03-31 LAB — BLOOD GAS, VENOUS
Acid-base deficit: 1.2 mmol/L (ref 0.0–2.0)
Bicarbonate: 29.3 mmol/L — ABNORMAL HIGH (ref 20.0–28.0)
O2 Saturation: 76.1 %
Patient temperature: 37
pCO2, Ven: 90 mmHg (ref 44.0–60.0)
pH, Ven: 7.12 — CL (ref 7.250–7.430)
pO2, Ven: 55 mmHg — ABNORMAL HIGH (ref 32.0–45.0)

## 2021-03-31 LAB — GLUCOSE, CAPILLARY
Glucose-Capillary: 131 mg/dL — ABNORMAL HIGH (ref 70–99)
Glucose-Capillary: 111 mg/dL — ABNORMAL HIGH (ref 70–99)
Glucose-Capillary: 103 mg/dL — ABNORMAL HIGH (ref 70–99)
Glucose-Capillary: 100 mg/dL — ABNORMAL HIGH (ref 70–99)
Glucose-Capillary: 135 mg/dL — ABNORMAL HIGH (ref 70–99)

## 2021-03-31 MED ORDER — HEPARIN SODIUM (PORCINE) 1000 UNIT/ML IJ SOLN
INTRAMUSCULAR | Status: AC
  Filled 2021-03-31: qty 4

## 2021-03-31 MED ORDER — CARBAMIDE PEROXIDE 6.5 % OT SOLN
5.0000 [drp] | Freq: Two times a day (BID) | OTIC | Status: DC
  Administered 2021-03-31 – 2021-04-07 (×16): 5 [drp] via OTIC
  Filled 2021-03-31: qty 15

## 2021-03-31 MED ORDER — SODIUM CHLORIDE 0.9 % IV SOLN
500.0000 mg | Freq: Once | INTRAVENOUS | Status: DC
  Filled 2021-03-31: qty 0.5

## 2021-03-31 NOTE — Progress Notes (Signed)
PROGRESS NOTE   Subjective/Complaints: Continues to have sensation of water in ear- will order debrox ear drops as well.  She was able to urinate 4x yesterday! Discussed that ESBL contact precautions need to be on until d/c  ROS: +lower extremity edema, denies pain, SOB, +sensation of water in ear  Objective:   No results found. Recent Labs    03/29/21 1432  WBC 10.3  HGB 8.1*  HCT 29.0*  PLT 300   Recent Labs    03/28/21 1836  NA 131*  K 3.9  CL 95*  CO2 24  GLUCOSE 153*  BUN 51*  CREATININE 5.84*  CALCIUM 9.4    Intake/Output Summary (Last 24 hours) at 03/31/2021 1023 Last data filed at 03/31/2021 0900 Gross per 24 hour  Intake 1130 ml  Output --  Net 1130 ml        Physical Exam: Vital Signs Blood pressure (!) 117/54, pulse 76, temperature 98.3 F (36.8 C), temperature source Oral, resp. rate 18, height '5\' 4"'  (1.626 m), weight (!) 160 kg, last menstrual period 07/17/2012, SpO2 91 %. Gen: no distress, normal appearing, BMI 60.55 HEENT: oral mucosa pink and moist, NCAT Cardio: Reg rate Chest: normal effort, normal rate of breathing Abd: soft, non-distended Skin: Stasis dermatitis bilateral pre tibial  Neurologic: Cranial nerves II through XII intact, motor strength is 5/5 in bilateral deltoid, bicep, tricep, grip, 3-/ 5 hip flexor, knee extensors, ankle dorsiflexor and plantar flexor Sensory exam normal sensation to light touch and proprioception in bilateral upper and lower extremities Cerebellar exam normal finger to nose to finger as well as heel to shin in bilateral upper and lower extremities Musculoskeletal: Full range of motion in all 4 extremities. No joint swelling  Ambulating to and from bathroom   Assessment/Plan: 1. Functional deficits which require 3+ hours per day of interdisciplinary therapy in a comprehensive inpatient rehab setting. Physiatrist is providing close team supervision  and 24 hour management of active medical problems listed below. Physiatrist and rehab team continue to assess barriers to discharge/monitor patient progress toward functional and medical goals  Care Tool:  Bathing    Body parts bathed by patient: Right arm, Left arm, Chest, Abdomen, Front perineal area, Right upper leg, Left upper leg, Face (Patient was able to simulate bathing UB, but was met with challenges for LB funciton for lower legs and feet.)   Body parts bathed by helper: Right lower leg, Left lower leg, Buttocks (Areas the pt was unable to address)     Bathing assist Assist Level:  (pt would benefit from additional training with A/E) Assistive Device Comment: Long handled sponge, seated bathing (patient would benefit from long handle sponge and is reported as having a raise toilet already in place.)   Upper Body Dressing/Undressing Upper body dressing   What is the patient wearing?: Pull over shirt    Upper body assist Assist Level: Minimal Assistance - Patient > 75% (secondary to O2 canal placement.)    Lower Body Dressing/Undressing Lower body dressing      What is the patient wearing?: Pants, Underwear/pull up     Lower body assist Assist for lower body dressing: Maximal Assistance - Patient  25 - 49% (Secondary to fatigue and challenges with active range) Assistive Device Comment: with reacher (Patient would benefit from additional practice)   Toileting Toileting Toileting Activity did not occur (Clothing management and hygiene only): Refused  Toileting assist Assist for toileting: Moderate Assistance - Patient 50 - 74%     Transfers Chair/bed transfer  Transfers assist  Chair/bed transfer activity did not occur: Refused (additional assessing needed in this area)  Chair/bed transfer assist level: Contact Guard/Touching assist Chair/bed transfer assistive device: Programmer, multimedia   Ambulation assist   Ambulation activity did not occur:  Refused (additional assessing needed in this area)  Assist level: Contact Guard/Touching assist Assistive device: Walker-rolling Max distance: 32f   Walk 10 feet activity   Assist  Walk 10 feet activity did not occur: Safety/medical concerns (weakness, fatigue, deconditioning)  Assist level: Contact Guard/Touching assist Assistive device: Walker-rolling   Walk 50 feet activity   Assist Walk 50 feet with 2 turns activity did not occur: Safety/medical concerns (weakness, fatigue, deconditioning)         Walk 150 feet activity   Assist Walk 150 feet activity did not occur: Safety/medical concerns (weakness, fatigue, deconditioning)         Walk 10 feet on uneven surface  activity   Assist Walk 10 feet on uneven surfaces activity did not occur: Safety/medical concerns (weakness, fatigue, deconditioning)         Wheelchair     Assist Is the patient using a wheelchair?: Yes Type of Wheelchair: Manual Wheelchair activity did not occur: Safety/medical concerns (weakness, fatigue, deconditioning)         Wheelchair 50 feet with 2 turns activity    Assist    Wheelchair 50 feet with 2 turns activity did not occur: Safety/medical concerns (weakness, fatigue, deconditioning)       Wheelchair 150 feet activity     Assist  Wheelchair 150 feet activity did not occur: Safety/medical concerns (weakness, fatigue, deconditioning)       Blood pressure (!) 117/54, pulse 76, temperature 98.3 F (36.8 C), temperature source Oral, resp. rate 18, height '5\' 4"'  (1.626 m), weight (!) 160 kg, last menstrual period 07/17/2012, SpO2 91 %.    Medical Problem List and Plan: 1. Functional deficits secondary to debility/acute on chronic hypercapnia respiratory failure.  Oxygen therapy as directed/BiPAP             -patient may  shower             -ELOS/Goals: 7-10d Mod I goals for mobility and self care   Discussed d/c date with her 2.   Antithrombotics: -DVT/anticoagulation:  Pharmaceutical: Other (comment) Eliquis             -antiplatelet therapy: N/A 3. Pain from pressure injury: improved, Neurontin 300 mg nightly, decrease tramadol to q12H prn 4. Mood: Melatonin as needed             -antipsychotic agents: N/A 5. Neuropsych: This patient is capable of making decisions on her own behalf. 6. Skin/Wound Care: Routine skin checks 7. Fluids/Electrolytes/Nutrition: Routine in and outs with follow-up chemistries 8.  End-stage renal disease.  Continue hemodialysis as per renal services.  Permacath placement 02/25/2021 9.  Hypotension   Norvasc 5 mg daily, Toprol XL 25 mg daily.  Monitor with increased mobility. Decrease Tramadol to q8H prn.  10.  History of atrial fibrillation/pulmonary emboli/acute on chronic CHF.  Continue Eliquis.  Cardiac rate controlled.  Continue beta-blocker.Monitor for fluid overload 11.  Hypothyroidism.  Synthroid 12.  Hyperlipidemia.  Lipitor 13.  History of chronic anemia. 14.  Diabetes mellitus.  Latest hemoglobin A1c 7.2.  SSI. Placed nursing order for no fluids other than water.  15.  History of hematuria.  Left ureteral stone with recent ureteroscopy/laser lithotripsy.  Repeat ultrasound showed resolution of left hydronephrosis.  Follow-up Dr.Sninsky. 16.  Morbid obesity.  BMI 62.74-->60.55. Provided dietary education.  17.  Acute blood loss anemia/leukocytosis.  Follow-up chemistries 18.  Urine culture positive ESBL/Proteus/E. coli.  Treat empirically with  one dose fosfomycin 2/9.  Contact precautions. Discussed with her that she will need these until day of discharge.  19.Leukocytosis.  Resolved, continue to monitor w/ HD. Follow-up per infectious disease Dr.Ravisankar and presently maintained on IV Invanz x 4 does. 20. Sensation of water in left ear: debrox ear drops ordered.   LOS: 3 days A FACE TO FACE EVALUATION WAS PERFORMED  Nicole Schmidt 03/31/2021, 10:23 AM

## 2021-03-31 NOTE — Progress Notes (Signed)
Physical Therapy Session Note  Patient Details  Name: Nicole Schmidt MRN: [id number] Date of Birth: 1965-09-06  Today's Date: 03/31/2021 PT Individual Time: 1000-1057 PT Individual Time Calculation (min): 57 min   Short Term Goals: Week 1:  PT Short Term Goal 1 (Week 1): pt will perform transfer with LRAD and CGA PT Short Term Goal 2 (Week 1): pt will ambulate 14f with LRAD and CGA PT Short Term Goal 3 (Week 1): pt will initate stair training  Skilled Therapeutic Interventions/Progress Updates:      Pt sitting in recliner to start. On 2L wall O2. Retrieved portable tank and pt on 2L throughout session. Pt denies pain. Sit<>stand from recliner with minA for boosting to rise - ambulatory transfer with CGA and RW within room to her w/c. Transported for time and energy conservation to main rehab gym.   Completed 6MWT with RW and 2L portable oxygen. She covered a total of 94.582fwith 1 sitting rest break after 536fOxygen desaturated from 100% to 93% after 81f26fd then from 96% to 88% after 94.5ft.25fygen recovered quickly with seated rest.  Gait speed 0.12 m/s indicative of household ambulator and increased falls risk.   Completed TUG x2 trials with RW. Scored 74 seconds and 67 seconds. Scored >13.5 seconds indicate increased falls risk.   Additional ambulation with weighted bariatric RW (unable to locate standard bariatric RW), ~40ft.74fmary gait deficits include decreased gait speed, decreased stride length, and wide BOS. Pt disliking weighted bariatric RW due to difficulty advancing and managing. Asked her to bring her in own bari RW from home and pt reports she will try.  Transported back to her room in w/c for energy conservation. Remained seated in w/c with all needs met, awaiting upcoming therapy appointment at 1100.   Therapy Documentation Precautions:  Precautions Precautions: Fall Precaution Comments: monitor O2 Restrictions Weight Bearing Restrictions: No RLE Weight  Bearing: Partial weight bearing LLE Weight Bearing: Partial weight bearing General:     Therapy/Group: Individual Therapy  ChristAlger Simons2023, 7:32 AM

## 2021-03-31 NOTE — Progress Notes (Signed)
Physical Therapy Session Note  Patient Details  Name: Nicole Schmidt MRN: [id number] Date of Birth: 28-Jun-1965  Today's Date: 03/31/2021 PT Individual Time: 1100-1203 PT Individual Time Calculation (min): 63 min   Short Term Goals: Week 1:  PT Short Term Goal 1 (Week 1): pt will perform transfer with LRAD and CGA PT Short Term Goal 2 (Week 1): pt will ambulate 53ft with LRAD and CGA PT Short Term Goal 3 (Week 1): pt will initate stair training  Skilled Therapeutic Interventions/Progress Updates:   Received pt sitting in WC, pt agreeable to PT treatment, and denied any pain during session. Pt on 2L O2 and remained on 2L throughout session with O2 sats >88% with activity. Session with emphasis on functional mobility/transfers, generalized strengthening and endurance, dynamic standing balance/coordination, stair navigation, toileting, and gait training. Pt transported to/from room in [employer] dependently for time management purposes. Pt navigated 8 3in steps with 2 rails and CGA - required 3 extended standing rest breaks on steps - ascending and descending with a step to pattern. Pt transported to dayroom and stood with RW and CGA and ambulated 80ft with RW and CGA - limited by fatigue and "heaviness" in legs. Attempted to locate wide RW (none available) but pt not wanting black bariatric RW reporting it's "weighted" and felt "too heavy". Also attempted to locate waffle cushion for recliner as pt reporting regular cushion from [facility] placed in recliner makes it "too high", however unable to locate. Returned to room and pt reported urge to void. Stood with RW and CGA/min A and ambulated in/out of bathroom with RW and CGA. Pt required assist for clothing management but able to void and perform peri-care with supervision. Ambulated back to bed and transferred sit<>supine with max A for BLE management. Scooted to [employer] with supervision with pt pulling on headboard with bed in Trendelenburg position. Concluded session with  pt sitting upright in bed, needs within reach, and bed alarm on.   Therapy Documentation Precautions:  Precautions Precautions: Fall Precaution Comments: monitor O2 Restrictions Weight Bearing Restrictions: No RLE Weight Bearing: Partial weight bearing LLE Weight Bearing: Partial weight bearing  Therapy/Group: Individual Therapy Alfonse Alpers PT, DPT   03/31/2021, 6:36 AM

## 2021-03-31 NOTE — Progress Notes (Signed)
Occupational Therapy Session Note  Patient Details  Name: Nicole Schmidt MRN: [id number] Date of Birth: [birth year]  Today's Date: 04/01/2021 OT Individual Time: 1137-1206 OT Individual Time Calculation (min): 29 min   Short Term Goals: Week 1:  OT Short Term Goal 1 (Week 1): STG = LTG 2/2 ELOS  Skilled Therapeutic Interventions/Progress Updates:    Pt greeted in bed with c/o arthritic pain in the Rt ankle. RN in at start of session to provide pain medicine. Pt requesting bedlevel therapy due to pain today. Guided her through UB exercises using 3# bar x15 reps 2 sets to work on UB strength/endurance for carryover during self care tasks and transfers. Pt tolerated exercises well, HOB elevated during. Also showed her how to use the gait belt to complete gentle ankle ROM in the bed to relieve pain. Pt appreciative. Left her with all needs within reach and bed alarm set at close of session.   Therapy Documentation Precautions:  Precautions Precautions: Fall Precaution Comments: monitor O2 Restrictions Weight Bearing Restrictions: No RLE Weight Bearing: Partial weight bearing LLE Weight Bearing: Partial weight bearing Pain: Pain Assessment Pain Scale: 0-10 Pain Score: 9  Pain Type: Acute pain Pain Location: Leg Pain Orientation: Left;Right Pain Descriptors / Indicators: Aching Pain Frequency: Constant Pain Onset: On-going Patients Stated Pain Goal: 3 Pain Intervention(s): Medication (See eMAR) ADL: ADL Equipment Provided: Long-handled sponge, Reacher Eating: Independent Where Assessed-Eating:  (patient not observed in this area, however, the pt is s/u in this area of function) Grooming: Setup Where Assessed-Grooming: Wheelchair (patient able to stand for short periods of time secondary to deconditioning.) Upper Body Bathing: Setup (patient was able to simulate bathing UB at w/c LOF with setup) Where Assessed-Upper Body Bathing: Chair Lower Body Bathing: Maximal assistance Where  Assessed-Lower Body Bathing: Chair Upper Body Dressing: Setup Where Assessed-Upper Body Dressing: Chair Lower Body Dressing: Maximal assistance Where Assessed-Lower Body Dressing: Chair Toileting: Dependent Where Assessed-Toileting: Other (Comment) (patient is currently using the bed pan with a desire to transition to previous LOF, Per pt report.) Toilet Transfer: Not assessed Toilet Transfer Method: Not assessed Tub/Shower Transfer: Not assessed Social research officer, government: Not assessed   Therapy/Group: Individual Therapy  Raeven Pint A Garry Nicolini 04/01/2021, 12:53 PM

## 2021-03-31 NOTE — Progress Notes (Signed)
[facility] Progress Note    Assessment/ Plan:   Assessment/Plan:  Debility - following prolonged hospitalizations.  In CIR.   Leukocytosis - resolved.  Likely due to #3.  Recent ESBL UTI/Hematuria/L ureteral stone sp stent placement on 02/14/21 -  Repeat US with resolution for hydronephrosis.  F/u urology at OP. On ertapenem until 2/16 Chronic respiratory failure/hypercapnic respiratory failure - on 2L O2 prior to last admission. Using BiPAP while sleeping with noted improvement.  ESRD -  on HD TTS. Can not come up to HD on BiPAP so avoid night time dialysis. Next HD today 2/16  Hypertension/volume  - Blood pressure well controlled.  Hypotension limits UF, may need to add midodrine for BP support.  Use albumin. Continue home meds.  Volume status improving, continue to titrate volume as tolerated.   Anemia of CKD - Hgb 8.1. Ordered Aranesp 134mcg qwk  Secondary Hyperparathyroidism -  Calcium and phos in goal.  Continue VDRA.    Nutrition - Renal diet w/fluid restrictions.  Hx A fib - on eliquis. DMT2 - per PMD Access: rij tdc. Patient wishes to have her AVF/AVG done as an outpatient/post discharge which is reasonable Dispo: EDD 3/1  Outpatient Dialysis Orders:  [facility] TTS [address] RIJ permcath  10000 unit EPO qHD Calcitriol 0.25mg  qd  Gean Quint, MD [facility]  Subjective:   No acute events, no complaints. She wishes to have her AVF/AVG done as an outpatient/post discharge   Objective:   BP (!) 117/54    Pulse 76    Temp 98.3 F (36.8 C) (Oral)    Resp 18    Ht 5\' 4"  (1.626 m)    Wt (!) 160 kg    LMP 07/17/2012    SpO2 91%    BMI 60.55 kg/m   Intake/Output Summary (Last 24 hours) at 03/31/2021 0902 Last data filed at 03/31/2021 0900 Gross per 24 hour  Intake 1130 ml  Output --  Net 1130 ml   Weight change:   Physical Exam: Gen:nad CVS:rrr, s1s2 Resp:normal wob GOT:LXBWI, soft Ext:1+ pitting edema b/l Les Neuro: awake, alert,  speech clear and coherent, moves all ext spontaneously Dialysis access: RIJ TDC c/d/i  Imaging: No results found.  Labs: BMET Recent Labs  Lab 03/25/21 0350 03/26/21 0153 03/27/21 0215 03/28/21 0920 03/28/21 1836  NA  --  131*  --  130* 131*  K  --  4.7  --  3.7 3.9  CL  --  94*  --  95* 95*  CO2  --  26  --  27 24  GLUCOSE  --  98  --  125* 153*  BUN  --  46*  --  48* 51*  CREATININE  --  4.91*  --  5.32* 5.84*  CALCIUM  --  9.2  --  9.4 9.4  PHOS 4.1 6.5* 3.2 4.9* 4.8*   CBC Recent Labs  Lab 03/25/21 1026 03/27/21 0215 03/28/21 0920 03/29/21 1432  WBC 18.8* 12.2* 11.4* 10.3  HGB 8.7* 8.4* 8.5* 8.1*  HCT 31.0* 29.0* 30.3* 29.0*  MCV 99.0 96.3 98.4 98.0  PLT 277 312 307 300    Medications:     amLODipine  5 mg Oral Daily   apixaban  5 mg Oral BID   atorvastatin  20 mg Oral QHS   bisacodyl  10 mg Rectal Daily   calcitRIOL  0.25 mcg Oral Daily   calcium carbonate  1,250 mg Oral Q breakfast   chlorhexidine  15 mL Mouth Rinse BID   Chlorhexidine Gluconate Cloth  6 each Topical Q0600   [START ON 04/07/2021] darbepoetin (ARANESP) injection - DIALYSIS  100 mcg Intravenous Q Thu-HD   famotidine  10 mg Oral Daily   feeding supplement (NEPRO CARB STEADY)  237 mL Oral BID BM   folic acid  5,681 mcg Oral Daily   gabapentin  300 mg Oral QHS   Gerhardt's butt cream  3 application Topical TID   insulin aspart  0-15 Units Subcutaneous TID WC   levothyroxine  112 mcg Oral QAC breakfast   mouth rinse  15 mL Mouth Rinse q12n4p   metoprolol succinate  25 mg Oral Daily   multivitamin  1 tablet Oral QHS   neomycin-polymyxin-hydrocortisone  3 drop Left EAR Q8H   sevelamer carbonate  800 mg Oral TID WC   Vitamin D (Ergocalciferol)  50,000 Units Oral Weekly      Gean Quint, MD [facility] 03/31/2021, 9:02 AM

## 2021-03-31 NOTE — IPOC Note (Signed)
Overall Plan of Care [facility]) Patient Details Name: Nicole Schmidt MRN: [id number] DOB: [birth year]  Admitting Diagnosis: Debility  Hospital Problems: Principal Problem:   Debility     Functional Problem List: Nursing Medication Management, Safety, Bladder, Bowel, Pain, Endurance, Edema, Skin Integrity  PT Balance, Edema, Endurance, Nutrition, Pain, Skin Integrity  OT Balance, Endurance, Pain, Motor (patient indicated that she typically has pain, but today was a good day bilateral LE)  SLP    TR         Basic ADLs: OT Dressing, Bathing, Grooming     Advanced  ADLs: OT Simple Meal Preparation, Light Housekeeping     Transfers: PT Bed Mobility, Bed to Chair, Car, Manufacturing systems engineer, Metallurgist: PT Ambulation, Emergency planning/management officer, Stairs     Additional Impairments: OT None (patient presents as extremely deconditioned.)  SLP        TR      Anticipated Outcomes Item Anticipated Outcome  Self Feeding    Swallowing      Basic self-care  ModI  Toileting  ModI   Bathroom Transfers ModI  Bowel/Bladder  manage bowel w mod I and bladder without assist; toileting protocol  Transfers  Mod I with LRAD  Locomotion  Mod I with LRAD  Communication     Cognition     Pain  pain at or below level 4 with prn meds  Safety/Judgment  maintain safety w cues   Therapy Plan: PT Intensity: Minimum of 1-2 x/day ,45 to 90 minutes PT Frequency: 5 out of 7 days PT Duration Estimated Length of Stay: 2 weeks OT Intensity: Minimum of 1-2 x/day, 45 to 90 minutes OT Frequency: 5 out of 7 days OT Duration/Estimated Length of Stay: 2 weeks     Due to the current state of emergency, patients may not be receiving their 3-hours of Medicare-mandated therapy.   Team Interventions: Nursing Interventions Bladder Management, Disease Management/Prevention, Medication Management, Discharge Planning, Skin Care/Wound Management, Pain Management, Bowel Management,  Patient/Family Education  PT interventions Ambulation/gait training, Discharge planning, Functional mobility training, Psychosocial support, Therapeutic Activities, Balance/vestibular training, Disease management/prevention, Neuromuscular re-education, Skin care/wound management, Therapeutic Exercise, Wheelchair propulsion/positioning, DME/adaptive equipment instruction, Pain management, UE/LE Strength taining/ROM, Community reintegration, Barrister's clerk education, IT trainer, UE/LE Coordination activities, Cognitive remediation/compensation, Splinting/orthotics  OT Interventions Therapeutic Exercise, Patient/family education, UE/LE Strength taining/ROM, Pain management, Self Care/advanced ADL retraining  SLP Interventions    TR Interventions    SW/CM Interventions Discharge Planning, Psychosocial Support, Patient/Family Education, Disease Management/Prevention   Barriers to Discharge MD  Medical stability  Nursing Lack of/limited family support, Home environment Child psychotherapist, Insurance for SNF coverage, Hemodialysis 1 level 2/4 ste bil rails solo, cousin provides transport to HD  PT Inaccessible home environment, Decreased caregiver support, Home environment access/layout, Weight, Hemodialysis, Lack of/limited family support lives alone, has STE but reports plan to get ramp installed, dialysis, generalized weakness/deconditioning  OT Decreased caregiver support, Inaccessible home environment, Home environment Child psychotherapist, [employer]    SLP      [place] for SNF coverage, Lack of/limited family support     Team Discharge Planning: Destination: PT-Home ,OT- Home , SLP-  Projected Follow-up: PT-Home health PT, OT-  Home health OT, SLP-  Projected Equipment Needs: PT-To be determined, OT- To be determined, 3 in 1 bedside comode, SLP-  Equipment Details: PT- , OT-  Patient/family involved in discharge planning: PT- Patient,  OT-Patient, SLP-   MD ELOS: 16 days Medical Rehab Prognosis:   Excellent Assessment:  Nicole Schmidt is a 56 year old woman admitted to CIR with functional deficits secondary to debility/acute on chronic hypercapnia respiratory failure. Medications are being managed, and labs and vitals are being monitored regularly.     See Team Conference Notes for weekly updates to the plan of care

## 2021-03-31 NOTE — Progress Notes (Signed)
Occupational Therapy Session Note  Patient Details  Name: Nicole Schmidt MRN: [id number] Date of Birth: [birth year]  Today's Date: 03/31/2021 OT Individual Time: 0802-0920 OT Individual Time Calculation (min): 78 min    Short Term Goals: Week 1:  OT Short Term Goal 1 (Week 1): STG = LTG 2/2 ELOS  Skilled Therapeutic Interventions/Progress Updates:  Pt greeted  supine in bed reporting fatigue but agreeable to OT intervention. Session focus on BADL reeducation, functional mobility, dynamic standing balance and decreasing overall caregiver burden.    Pt completed supine>sit with MIN A needed to elevate trunk into sitting. Pt on 2L during transition with SpO2 dropping to 82% needing seated rest break and PLB for ~ 2 mins to increase SpO2 to >90%.  Pt completed sit>stand from EOB with CGA from slightly elevated EOB. Pt completed slow but ambulatory toilet transfer from EOB >bathroom with RW and CGA. +urine void/BM  pt needed extended time for pericare via lateral leans using toileting aid. Pt needed MIN A from OTA to stand from low toilet seat. Pt did need assist from OTA for posterior pericare in standing d/t cleanliness. Pt exited bathroom in similar fashion as previously indicated to ambulate to w/c in front of sink. Pt completed grooming tasks at sink MODI. OTA washed pts back and applied lotion d/t reports of itching.  Pt ambulated to recliner with MIN A for sit>stand and CGA for functional ambulation.      pt left  seated in recliner with all needs within reach.                   Therapy Documentation Precautions:  Precautions Precautions: Fall Precaution Comments: monitor O2 Restrictions Weight Bearing Restrictions: No RLE Weight Bearing: Partial weight bearing LLE Weight Bearing: Partial weight bearing  Pain: no pain reported during session    Therapy/Group: Individual Therapy  Corinne Ports [facility] 03/31/2021, 12:07 PM

## 2021-04-01 ENCOUNTER — Inpatient Hospital Stay (HOSPITAL_COMMUNITY): Payer: BC Managed Care – PPO

## 2021-04-01 LAB — GLUCOSE, CAPILLARY
Glucose-Capillary: 107 mg/dL — ABNORMAL HIGH (ref 70–99)
Glucose-Capillary: 120 mg/dL — ABNORMAL HIGH (ref 70–99)
Glucose-Capillary: 107 mg/dL — ABNORMAL HIGH (ref 70–99)
Glucose-Capillary: 136 mg/dL — ABNORMAL HIGH (ref 70–99)

## 2021-04-01 MED ORDER — OXYCODONE HCL 5 MG PO TABS
5.0000 mg | ORAL_TABLET | Freq: Every day | ORAL | Status: DC | PRN
  Administered 2021-04-01 – 2021-04-04 (×2): 5 mg via ORAL
  Filled 2021-04-01 (×3): qty 1

## 2021-04-01 NOTE — Progress Notes (Addendum)
PROGRESS NOTE   Subjective/Complaints: Complains of right ankle pain- feels like it is due to her OA which she notes worsens with rainy weather. Pain was so significant she was unable to stand this morning. Tramadol did not help  ROS: +lower extremity edema, denies pain, SOB, +sensation of water in ear, +right ankle pain- chronic  Objective:   No results found. Recent Labs    03/29/21 1432  WBC 10.3  HGB 8.1*  HCT 29.0*  PLT 300   Recent Labs    03/31/21 1259  NA 129*  K 4.2  CL 93*  CO2 27  GLUCOSE 117*  BUN 45*  CREATININE 5.36*  CALCIUM 10.6*    Intake/Output Summary (Last 24 hours) at 04/01/2021 1119 Last data filed at 03/31/2021 2000 Gross per 24 hour  Intake 600 ml  Output 2749 ml  Net -2149 ml        Physical Exam: Vital Signs Blood pressure (!) 159/69, pulse 89, temperature 98.4 F (36.9 C), temperature source Oral, resp. rate 18, height 5' 4" (1.626 m), weight (!) 156.8 kg, last menstrual period 07/17/2012, SpO2 93 %. Gen: no distress, normal appearing, BMI 60.55 HEENT: oral mucosa pink and moist, NCAT Cardio: Reg rate Chest: normal effort, normal rate of breathing Abd: soft, non-distended Skin: Stasis dermatitis bilateral pre tibial  Neurologic: Cranial nerves II through XII intact, motor strength is 5/5 in bilateral deltoid, bicep, tricep, grip, 3-/ 5 hip flexor, knee extensors, ankle dorsiflexor and plantar flexor Sensory exam normal sensation to light touch and proprioception in bilateral upper and lower extremities. + allodynia of lower extremities Cerebellar exam normal finger to nose to finger as well as heel to shin in bilateral upper and lower extremities Musculoskeletal: Full range of motion in all 4 extremities. No joint swelling.  Ambulating to and from bathroom   Assessment/Plan: 1. Functional deficits which require 3+ hours per day of interdisciplinary therapy in a comprehensive  inpatient rehab setting. Physiatrist is providing close team supervision and 24 hour management of active medical problems listed below. Physiatrist and rehab team continue to assess barriers to discharge/monitor patient progress toward functional and medical goals  Care Tool:  Bathing    Body parts bathed by patient: Right arm, Left arm, Chest, Abdomen, Front perineal area, Right upper leg, Left upper leg, Face (Patient was able to simulate bathing UB, but was met with challenges for LB funciton for lower legs and feet.)   Body parts bathed by helper: Right lower leg, Left lower leg, Buttocks (Areas the pt was unable to address)     Bathing assist Assist Level: Maximal Assistance - Patient 24 - 49% (per OT evaluation note) Assistive Device Comment: Long handled sponge, seated bathing (patient would benefit from long handle sponge and is reported as having a raise toilet already in place.)   Upper Body Dressing/Undressing Upper body dressing   What is the patient wearing?: Pull over shirt    Upper body assist Assist Level: Minimal Assistance - Patient > 75% (secondary to O2 canal placement.)    Lower Body Dressing/Undressing Lower body dressing      What is the patient wearing?: Pants, Underwear/pull up  Lower body assist Assist for lower body dressing: Maximal Assistance - Patient 25 - 49% (Secondary to fatigue and challenges with active range) Assistive Device Comment: with reacher (Patient would benefit from additional practice)   Toileting Toileting Toileting Activity did not occur (Clothing management and hygiene only): Refused  Toileting assist Assist for toileting: Minimal Assistance - Patient > 75%     Transfers Chair/bed transfer  Transfers assist  Chair/bed transfer activity did not occur: Refused (additional assessing needed in this area)  Chair/bed transfer assist level: Contact Guard/Touching assist Chair/bed transfer assistive device: Arboriculturist   Ambulation assist   Ambulation activity did not occur: Refused (additional assessing needed in this area)  Assist level: Contact Guard/Touching assist Assistive device: Walker-rolling Max distance: 40f   Walk 10 feet activity   Assist  Walk 10 feet activity did not occur: Safety/medical concerns (weakness, fatigue, deconditioning)  Assist level: Contact Guard/Touching assist Assistive device: Walker-rolling   Walk 50 feet activity   Assist Walk 50 feet with 2 turns activity did not occur: Safety/medical concerns (weakness, fatigue, deconditioning)         Walk 150 feet activity   Assist Walk 150 feet activity did not occur: Safety/medical concerns (weakness, fatigue, deconditioning)         Walk 10 feet on uneven surface  activity   Assist Walk 10 feet on uneven surfaces activity did not occur: Safety/medical concerns (weakness, fatigue, deconditioning)         Wheelchair     Assist Is the patient using a wheelchair?: Yes Type of Wheelchair: Manual Wheelchair activity did not occur: Safety/medical concerns (weakness, fatigue, deconditioning)         Wheelchair 50 feet with 2 turns activity    Assist    Wheelchair 50 feet with 2 turns activity did not occur: Safety/medical concerns (weakness, fatigue, deconditioning)       Wheelchair 150 feet activity     Assist  Wheelchair 150 feet activity did not occur: Safety/medical concerns (weakness, fatigue, deconditioning)       Blood pressure (!) 159/69, pulse 89, temperature 98.4 F (36.9 C), temperature source Oral, resp. rate 18, height 5' 4" (1.626 m), weight (!) 156.8 kg, last menstrual period 07/17/2012, SpO2 93 %.    Medical Problem List and Plan: 1. Functional deficits secondary to debility/acute on chronic hypercapnia respiratory failure.  Oxygen therapy as directed/BiPAP             -patient may  shower             -ELOS/Goals: 16 days Mod I goals  for mobility and self care   Discussed d/c date with her  Continue CIR 2.  Antithrombotics: -DVT/anticoagulation:  Pharmaceutical: Other (comment) Eliquis             -antiplatelet therapy: N/A 3. Pain from pressure injury: improved, Neurontin 300 mg nightly, decrease tramadol to q12H prn for moderate pain 4. Mood: Melatonin as needed             -antipsychotic agents: N/A 5. Neuropsych: This patient is capable of making decisions on her own behalf. 6. Skin/Wound Care: Routine skin checks 7. Fluids/Electrolytes/Nutrition: Routine in and outs with follow-up chemistries 8.  End-stage renal disease.  Continue hemodialysis as per renal services.  Permacath placement 02/25/2021..Marland KitchenDiscussed her recent creatinine levels with her 9.  Hypotension   Norvasc 5 mg daily, Toprol XL 25 mg daily.  Monitor with increased mobility. Decrease Tramadol to q8H prn.  10.  History of atrial fibrillation/pulmonary emboli/acute on chronic CHF.  Continue Eliquis.  Cardiac rate controlled.  Continue beta-blocker.Monitor for fluid overload 11.  Hypothyroidism.  Synthroid 12.  Hyperlipidemia.  Lipitor 13.  History of chronic anemia. 14.  Diabetes mellitus.  Latest hemoglobin A1c 7.2.  SSI. Placed nursing order for no fluids other than water.  15.  History of hematuria.  Left ureteral stone with recent ureteroscopy/laser lithotripsy.  Repeat ultrasound showed resolution of left hydronephrosis.  Follow-up Dr.Sninsky. 16.  Morbid obesity.  BMI 62.74-->60.55. Provided dietary education.  17.  Acute blood loss anemia/leukocytosis.  Follow-up chemistries 18.  Urine culture positive ESBL/Proteus/E. coli.  Treat empirically with  one dose fosfomycin 2/9.  Contact precautions. Discussed with her that she will need these until day of discharge.  19.Leukocytosis.  Resolved, continue to monitor w/ HD. Follow-up per infectious disease Dr.Ravisankar and presently maintained on IV Invanz x 4 does. 20. Sensation of water in left ear:  debrox and antibiotic drops ordered.  21. Right ankle pain 2/2 OA: worse with rainy weather: oxycodone 66m daily PRN ordered for severe pain. Check ankle XR.   LOS: 4 days A FACE TO FACE EVALUATION WAS PERFORMED  KMartha ClanP Da Michelle 04/01/2021, 11:19 AM

## 2021-04-01 NOTE — Progress Notes (Signed)
[place] and spoke to [employer]. Confirmed pt's schedule of TTS with 10:45 chair time. Pt had one treatment at clinic prior to being re-hospitalized. Amber advised of target d/c date of 3/1. Will provide update to clinic as d/c date gets closer.   Melven Sartorius Renal Navigator [phone]

## 2021-04-01 NOTE — Progress Notes (Signed)
Occupational Therapy Session Note  Patient Details  Name: Nicole Schmidt MRN: [id number] Date of Birth: [birth year]  Today's Date: 04/01/2021 OT Individual Time: 8832-5498 OT Individual Time Calculation (min): 32 min    Short Term Goals: Week 1:  OT Short Term Goal 1 (Week 1): STG = LTG 2/2 ELOS  Skilled Therapeutic Interventions/Progress Updates:    Pt received semi-reclined in bed, reports R ankle pain but improved from this am, agreeable to therapy. Session focus on self-care retraining, activity tolerance, transfer retraining in prep for improved ADL/IADL/func mobility performance + decreased caregiver burden. Came to sitting EOB with increased time and min A for LUE placement on bed rail and to lift trunk. Min A to power up from elevated height bed with RW, much increased time required due to fatigue. Ambulatory toilet transfer with increased time + RW + CGA throughout due to R knee pain and inability to fully WB. Continent void of bladder x2, total A in standing for anterior pericare due to fatigue.   SatO2 at 91-95% on 2L via [place] throughout session.  Pt left seated in w/c awaiting following PT session with  call bell in reach, and all immediate needs met.    Therapy Documentation Precautions:  Precautions Precautions: Fall Precaution Comments: monitor O2 Restrictions Weight Bearing Restrictions: No RLE Weight Bearing: Partial weight bearing LLE Weight Bearing: Partial weight bearing  Pain: see session note   ADL: See Care Tool for more details.   Therapy/Group: Individual Therapy  Volanda Napoleon MS, OTR/L  04/01/2021, 7:03 AM

## 2021-04-01 NOTE — Progress Notes (Signed)
Physical Therapy Session Note  Patient Details  Name: Nicole Schmidt MRN: [id number] Date of Birth: [birth year]  Today's Date: 04/01/2021 PT Individual Time: 3888-2800 and 1445-1535 PT Individual Time Calculation (min): 72 min and 40 min  Short Term Goals: Week 1:  PT Short Term Goal 1 (Week 1): pt will perform transfer with LRAD and CGA PT Short Term Goal 2 (Week 1): pt will ambulate 36ft with LRAD and CGA PT Short Term Goal 3 (Week 1): pt will initate stair training  Skilled Therapeutic Interventions/Progress Updates:   Treatment Session 1 Received pt semi-reclined in bed eating breakfast. Pt agreeable to PT treatment and denied any pain initially, just "stiffness" in R ankle most likely due to weather changes. Pt on 2L O2 via [place] throughout session with O2 sats WNL and pt asymptomatic. While pt ate breakfast, de-cluttered room of unnecessary equipment, located transporter for O2 tank, and brought fresh gown to room. Encouraged sitting EOB to eat for improved digestion and to work on sitting balance, however pt politely declined. Session with emphasis on functional mobility/transfers, generalized strengthening and endurance, and dynamic sitting balance. Pt requested to try to use restroom and transferred semi-reclined<>sitting EOB with HOB elevated and close supervision with increased time and effort to sit up. Doffed dirty gown and donned clean one with min A. Pt reported increased pain along lateral R ankle just sitting EOB - RN notified and present to administer pain medication. Attempted stand from elevated EOB x 2 trials but pt unable due to pain in R ankle increasing to 9/10 - notified MD. Dialysis MD present for brief check in and pt agreeable to exercises sitting EOB: -hip flexion 2x15 bilaterally -LAQ 2x15 bilaterally  -hip adduction pillow squeezes -heel/toe raises 2x20 Pt transferred sit<>supine with max A for BLE management. Inverted bed to Trendelenburg position and pt scooted to HOB  pulling on headboard while therapist stabilized LEs in hooklying position for pt to scoot. Continued with the following theraex: -hip abduction 2x20 bilaterally  -heel slides 2x15 bilaterally Concluded session with pt semi-reclined in bed, needs within reach, and bed alarm on.   Treatment Session 2 Received pt sitting in WC, pt agreeable to PT treatment, and reported 6/10 pain in R ankle (premedicated). Pt requesting x-ray just to be sure nothing broken - MD notified. Pt on 2L O2 via [place] throughout session with O2 sats WFL. Session with emphasis on functional mobility, generalized strengthening and endurance, dynamic standing balance/coordination, and gait training. Sit<>stand with RW and CGA and ambulated 40ft x1 with 3 standing rest breaks and 20ft x 1 with 1 standing rest break. Pt ambulates with decreased cadence, wide BOS, and decreased step length. Took extended seated rest break EOB then transferred sit<>supine with max A for BLE management. Inverted bed to Trendelenburg position and pt scooted to HOB pulling on headboard. Concluded session with pt sitting upright in bed, needs within reach, and bed alarm on.   Therapy Documentation Precautions:  Precautions Precautions: Fall Precaution Comments: monitor O2 Restrictions Weight Bearing Restrictions: No RLE Weight Bearing: Partial weight bearing LLE Weight Bearing: Partial weight bearing  Therapy/Group: Individual Therapy Alfonse Alpers PT, DPT   04/01/2021, 7:02 AM

## 2021-04-01 NOTE — Progress Notes (Signed)
Respiratory went to room to put pt's bipap on and pt requested to wait til midnight to put it on.

## 2021-04-01 NOTE — Progress Notes (Signed)
[facility] Progress Note    Assessment/ Plan:   Assessment/Plan:  Debility - following prolonged hospitalizations.  In CIR.   Recent ESBL UTI/Hematuria/L ureteral stone sp stent placement on 02/14/21 -  Repeat US with resolution for hydronephrosis.  F/u urology at OP. On ertapenem until 2/16 Chronic respiratory failure/hypercapnic respiratory failure - on 2L O2 prior to last admission. Using BiPAP while sleeping with noted improvement.  ESRD -  on HD TTS. Can not come up to HD on BiPAP QHS so avoid night time dialysis. Patient wishes to pursue permanent access placement as an outpatient, will hold off on consulting [facility]. Next HD 2/18  Hypertension/volume  - Blood pressure well controlled.  Hypotension limits UF, may need to add midodrine for BP support.  Use albumin. Continue home meds.  Volume status improving, Uf'ing as tolerated  Anemia of CKD - Hgb 8.1 2/14. Aranesp 1100mcg qwk. Iron panel  Secondary Hyperparathyroidism -  Calcium and phos in goal.  Continue VDRA.  renal diet, on revela. Cal trending up, consider holding os-cal  Nutrition - Renal diet w/fluid restrictions.  Hx A fib - on eliquis. DMT2 - per PMD Access: rij tdc. Patient wishes to have her AVF/AVG done as an outpatient/post discharge which is reasonable Dispo: EDD 3/1  Outpatient Dialysis Orders:  [facility] TTS [address] RIJ permcath  10000 unit EPO qHD Calcitriol 0.25mg  qd  Gean Quint, MD [facility]  Subjective:   Pt seen and examined and examined in room. No acute events. Tolerated HD yesterday (slept through it). Net uf 2749cc   Objective:   BP (!) 159/69    Pulse 89    Temp 98.4 F (36.9 C) (Oral)    Resp 18    Ht 5\' 4"  (1.626 m)    Wt (!) 156.8 kg    LMP 07/17/2012    SpO2 93%    BMI 59.34 kg/m   Intake/Output Summary (Last 24 hours) at 04/01/2021 0847 Last data filed at 03/31/2021 2000 Gross per 24 hour  Intake 960 ml  Output 2749 ml  Net -1789 ml   Weight change:    Physical Exam: Gen:nad CVS:rrr, s1s2 Resp:normal wob BPZ:WCHEN, soft Ext: trace pitting edema b/l Les Neuro: awake, alert, speech clear and coherent, moves all ext spontaneously Dialysis access: RIJ TDC c/d/i  Imaging: No results found.  Labs: BMET Recent Labs  Lab 03/26/21 0153 03/27/21 0215 03/28/21 0920 03/28/21 1836 03/31/21 1259  NA 131*  --  130* 131* 129*  K 4.7  --  3.7 3.9 4.2  CL 94*  --  95* 95* 93*  CO2 26  --  27 24 27   GLUCOSE 98  --  125* 153* 117*  BUN 46*  --  48* 51* 45*  CREATININE 4.91*  --  5.32* 5.84* 5.36*  CALCIUM 9.2  --  9.4 9.4 10.6*  PHOS 6.5* 3.2 4.9* 4.8* 5.9*   CBC Recent Labs  Lab 03/25/21 1026 03/27/21 0215 03/28/21 0920 03/29/21 1432  WBC 18.8* 12.2* 11.4* 10.3  HGB 8.7* 8.4* 8.5* 8.1*  HCT 31.0* 29.0* 30.3* 29.0*  MCV 99.0 96.3 98.4 98.0  PLT 277 312 307 300    Medications:     amLODipine  5 mg Oral Daily   apixaban  5 mg Oral BID   atorvastatin  20 mg Oral QHS   bisacodyl  10 mg Rectal Daily   calcitRIOL  0.25 mcg Oral Daily   calcium carbonate  1,250 mg Oral Q breakfast  carbamide peroxide  5 drop Left EAR BID   chlorhexidine  15 mL Mouth Rinse BID   Chlorhexidine Gluconate Cloth  6 each Topical Q0600   [START ON 04/07/2021] darbepoetin (ARANESP) injection - DIALYSIS  100 mcg Intravenous Q Thu-HD   famotidine  10 mg Oral Daily   feeding supplement (NEPRO CARB STEADY)  237 mL Oral BID BM   folic acid  3,500 mcg Oral Daily   gabapentin  300 mg Oral QHS   Gerhardt's butt cream  3 application Topical TID   insulin aspart  0-15 Units Subcutaneous TID WC   levothyroxine  112 mcg Oral QAC breakfast   mouth rinse  15 mL Mouth Rinse q12n4p   metoprolol succinate  25 mg Oral Daily   multivitamin  1 tablet Oral QHS   neomycin-polymyxin-hydrocortisone  3 drop Left EAR Q8H   sevelamer carbonate  800 mg Oral TID WC   Vitamin D (Ergocalciferol)  50,000 Units Oral Weekly      Gean Quint, MD [facility] 04/01/2021, 8:47 AM

## 2021-04-02 LAB — CBC
HCT: 29.9 % — ABNORMAL LOW (ref 36.0–46.0)
Hemoglobin: 8.2 g/dL — ABNORMAL LOW (ref 12.0–15.0)
MCH: 27.5 pg (ref 26.0–34.0)
MCHC: 27.4 g/dL — ABNORMAL LOW (ref 30.0–36.0)
MCV: 100.3 fL — ABNORMAL HIGH (ref 80.0–100.0)
Platelets: 287 10*3/uL (ref 150–400)
RBC: 2.98 MIL/uL — ABNORMAL LOW (ref 3.87–5.11)
RDW: 16.9 % — ABNORMAL HIGH (ref 11.5–15.5)
WBC: 10.1 10*3/uL (ref 4.0–10.5)
nRBC: 0.3 % — ABNORMAL HIGH (ref 0.0–0.2)

## 2021-04-02 LAB — RENAL FUNCTION PANEL
Albumin: 2.7 g/dL — ABNORMAL LOW (ref 3.5–5.0)
Anion gap: 8 (ref 5–15)
BUN: 40 mg/dL — ABNORMAL HIGH (ref 6–20)
CO2: 27 mmol/L (ref 22–32)
Calcium: 10 mg/dL (ref 8.9–10.3)
Chloride: 93 mmol/L — ABNORMAL LOW (ref 98–111)
Creatinine, Ser: 4.92 mg/dL — ABNORMAL HIGH (ref 0.44–1.00)
GFR, Estimated: 10 mL/min — ABNORMAL LOW (ref >60)
Glucose, Bld: 133 mg/dL — ABNORMAL HIGH (ref 70–99)
Phosphorus: 8 mg/dL — ABNORMAL HIGH (ref 2.5–4.6)
Potassium: 5 mmol/L (ref 3.5–5.1)
Sodium: 128 mmol/L — ABNORMAL LOW (ref 135–145)

## 2021-04-02 LAB — IRON AND TIBC
Iron: 49 ug/dL (ref 28–170)
Saturation Ratios: 13 % (ref 10.4–31.8)
TIBC: 377 ug/dL (ref 250–450)
UIBC: 328 ug/dL

## 2021-04-02 LAB — GLUCOSE, CAPILLARY
Glucose-Capillary: 102 mg/dL — ABNORMAL HIGH (ref 70–99)
Glucose-Capillary: 117 mg/dL — ABNORMAL HIGH (ref 70–99)
Glucose-Capillary: 150 mg/dL — ABNORMAL HIGH (ref 70–99)
Glucose-Capillary: 128 mg/dL — ABNORMAL HIGH (ref 70–99)

## 2021-04-02 LAB — FERRITIN: Ferritin: 209 ng/mL (ref 11–307)

## 2021-04-02 MED ORDER — DICLOFENAC SODIUM 1 % EX GEL
2.0000 g | Freq: Four times a day (QID) | CUTANEOUS | Status: DC
  Administered 2021-04-02 – 2021-04-15 (×39): 2 g via TOPICAL
  Filled 2021-04-02: qty 100

## 2021-04-02 MED ORDER — HEPARIN SODIUM (PORCINE) 1000 UNIT/ML IJ SOLN
INTRAMUSCULAR | Status: AC
  Filled 2021-04-02: qty 5

## 2021-04-02 MED ORDER — ONDANSETRON 4 MG PO TBDP
4.0000 mg | ORAL_TABLET | Freq: Four times a day (QID) | ORAL | Status: DC | PRN
  Administered 2021-04-02: 4 mg via ORAL
  Filled 2021-04-02: qty 1

## 2021-04-02 NOTE — Progress Notes (Signed)
[facility] Progress Note    Assessment/ Plan:   Assessment/Plan:  Debility - following prolonged hospitalizations.  In CIR.   Recent ESBL UTI/Hematuria/L ureteral stone sp stent placement on 02/14/21 -  Repeat US with resolution for hydronephrosis.  F/u urology at OP. On ertapenem until 2/16 Chronic respiratory failure/hypercapnic respiratory failure - on 2L O2 prior to last admission. Using BiPAP while sleeping with noted improvement.  ESRD -  on HD TTS. Can not come up to HD on BiPAP QHS so avoid night time dialysis. Patient wishes to pursue permanent access placement as an outpatient, will hold off on consulting VVS. Next HD today 2/18  Hypertension/volume  - Blood pressure well controlled.  Hypotension limits UF, may need to add midodrine for BP support.  Use albumin. Continue home meds.  Volume status improving, Uf'ing as tolerated. Probably has some chronic venous insuffiencey as well  Anemia of CKD - Hgb 8.1 2/14. Aranesp 136mcg qwk. Will wait until she's done with abx before starting Fe IV, cbc w/ hd today  Secondary Hyperparathyroidism -  Calcium and phos in goal.  Continue VDRA.  renal diet, on revela. Cal trending up, consider holding os-cal  Nutrition - Renal diet w/fluid restrictions.  Hx A fib - on eliquis. DMT2 - per PMD Access: rij tdc. Patient wishes to have her AVF/AVG done as an outpatient/post discharge which is reasonable Dispo: EDD 3/1  Outpatient Dialysis Orders:  [facility] TTS [address] RIJ permcath  10000 unit EPO qHD Calcitriol 0.25mg  qd  Gean Quint, MD [facility]  Subjective:   Pt seen and examined and examined in room. No acute events. Resting comfortably on bipap. Open for hd later today   Objective:   BP (!) 128/54 (BP Location: Left Arm)    Pulse 79    Temp (!) 97.4 F (36.3 C) (Oral)    Resp 18    Ht 5\' 4"  (1.626 m)    Wt (!) 156.8 kg    LMP 07/17/2012    SpO2 94%    BMI 59.34 kg/m   Intake/Output Summary (Last 24  hours) at 04/02/2021 0831 Last data filed at 04/01/2021 1700 Gross per 24 hour  Intake 320 ml  Output --  Net 320 ml   Weight change:   Physical Exam: Gen:nad CVS:rrr, s1s2 Resp:normal wob, on bipap OEV:OJJKK, soft Ext: trace pitting edema b/l Les Neuro: awake, alert, speech clear and coherent, moves all ext spontaneously Dialysis access: RIJ [facility] c/d/i  Imaging: DG Ankle Complete Right  Result Date: 04/01/2021 CLINICAL DATA:  Right ankle pain EXAM: RIGHT ANKLE - COMPLETE 3+ VIEW COMPARISON:  11/20/2004 FINDINGS: Frontal, oblique, lateral views of the right ankle are obtained. Evidence of prior healed lateral malleolar fracture. No acute fracture, subluxation, or dislocation. 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 Prominent superior and inferior calcaneal spurs. Mild diffuse subcutaneous edema. IMPRESSION: 1. Mild osteoarthritis. 2. No acute displaced fracture. 3. Prominent calcaneal spurs. Electronically Signed   By: Randa Ngo M.D.   On: 04/01/2021 17:06    Labs: BMET Recent Labs  Lab 03/27/21 0215 03/28/21 0920 03/28/21 1836 03/31/21 1259  NA  --  130* 131* 129*  K  --  3.7 3.9 4.2  CL  --  95* 95* 93*  CO2  --  27 24 27   GLUCOSE  --  125* 153* 117*  BUN  --  48* 51* 45*  CREATININE  --  5.32* 5.84* 5.36*  CALCIUM  --  9.4 9.4 10.6*  PHOS 3.2 4.9* 4.8* 5.9*   CBC Recent Labs  Lab 03/27/21 0215 03/28/21 0920 03/29/21 1432  WBC 12.2* 11.4* 10.3  HGB 8.4* 8.5* 8.1*  HCT 29.0* 30.3* 29.0*  MCV 96.3 98.4 98.0  PLT 312 307 300    Medications:     amLODipine  5 mg Oral Daily   apixaban  5 mg Oral BID   atorvastatin  20 mg Oral QHS   bisacodyl  10 mg Rectal Daily   calcitRIOL  0.25 mcg Oral Daily   calcium carbonate  1,250 mg Oral Q breakfast   carbamide peroxide  5 drop Left EAR BID   chlorhexidine  15 mL Mouth Rinse BID   Chlorhexidine Gluconate Cloth  6 each Topical Q0600   [START ON 04/07/2021] darbepoetin (ARANESP)  injection - DIALYSIS  100 mcg Intravenous Q Thu-HD   famotidine  10 mg Oral Daily   feeding supplement (NEPRO CARB STEADY)  237 mL Oral BID BM   folic acid  8,341 mcg Oral Daily   gabapentin  300 mg Oral QHS   Gerhardt's butt cream  3 application Topical TID   insulin aspart  0-15 Units Subcutaneous TID WC   levothyroxine  112 mcg Oral QAC breakfast   mouth rinse  15 mL Mouth Rinse q12n4p   metoprolol succinate  25 mg Oral Daily   multivitamin  1 tablet Oral QHS   neomycin-polymyxin-hydrocortisone  3 drop Left EAR Q8H   sevelamer carbonate  800 mg Oral TID WC   Vitamin D (Ergocalciferol)  50,000 Units Oral Weekly      Gean Quint, MD [facility] 04/02/2021, 8:31 AM

## 2021-04-02 NOTE — Progress Notes (Signed)
PROGRESS NOTE   Subjective/Complaints: Ankle pain complaints yesterday, x-ray showed no fracture mild arthritis of the right ankle joint Pain is with certain movements as well as with standing ROS: +lower extremity edema, denies pain, SOB, +sensation of water in ear, +right ankle pain- chronic  Objective:   DG Ankle Complete Right  Result Date: 04/01/2021 CLINICAL DATA:  Right ankle pain EXAM: RIGHT ANKLE - COMPLETE 3+ VIEW COMPARISON:  11/20/2004 FINDINGS: Frontal, oblique, lateral views of the right ankle are obtained. Evidence of prior healed lateral malleolar fracture. No acute fracture, subluxation, or dislocation. 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 Prominent superior and inferior calcaneal spurs. Mild diffuse subcutaneous edema. IMPRESSION: 1. Mild osteoarthritis. 2. No acute displaced fracture. 3. Prominent calcaneal spurs. Electronically Signed   By: Randa Ngo M.D.   On: 04/01/2021 17:06   No results for input(s): WBC, HGB, HCT, PLT in the last 72 hours.  Recent Labs    03/31/21 1259  NA 129*  K 4.2  CL 93*  CO2 27  GLUCOSE 117*  BUN 45*  CREATININE 5.36*  CALCIUM 10.6*     Intake/Output Summary (Last 24 hours) at 04/02/2021 1057 Last data filed at 04/01/2021 1700 Gross per 24 hour  Intake 320 ml  Output --  Net 320 ml         Physical Exam: Vital Signs Blood pressure (!) 128/54, pulse 79, temperature (!) 97.4 F (36.3 C), temperature source Oral, resp. rate 18, height '5\' 4"'  (1.626 m), weight (!) 156.8 kg, last menstrual period 07/17/2012, SpO2 94 %.  General: No acute distress Mood and affect are appropriate Heart: Regular rate and rhythm no rubs murmurs or extra sounds Lungs: Clear to auscultation, breathing unlabored, no rales or wheezes Abdomen: Positive bowel sounds, soft nontender to palpation, nondistended Extremities: No clubbing, cyanosis, or edema  Skin:  Stasis dermatitis bilateral pre tibial  Neurologic: Cranial nerves II through XII intact, motor strength is 5/5 in bilateral deltoid, bicep, tricep, grip, 3-/ 5 hip flexor, knee extensors, ankle dorsiflexor and plantar flexor Sensory exam normal sensation to light touch and proprioception in bilateral upper and lower extremities. + allodynia of lower extremities Cerebellar exam normal finger to nose to finger as well as heel to shin in bilateral upper and lower extremities Musculoskeletal: Full range of motion in all 4 extremities. No joint swelling.  Pain with right ankle dorsiflexion no evidence of erythema or skin lesions   Assessment/Plan: 1. Functional deficits which require 3+ hours per day of interdisciplinary therapy in a comprehensive inpatient rehab setting. Physiatrist is providing close team supervision and 24 hour management of active medical problems listed below. Physiatrist and rehab team continue to assess barriers to discharge/monitor patient progress toward functional and medical goals  Care Tool:  Bathing    Body parts bathed by patient: Right arm, Left arm, Chest, Abdomen, Front perineal area, Right upper leg, Left upper leg, Face (Patient was able to simulate bathing UB, but was met with challenges for LB funciton for lower legs and feet.)   Body parts bathed by helper: Right lower leg, Left lower leg, Buttocks (Areas the pt was unable to address)  Bathing assist Assist Level: Maximal Assistance - Patient 24 - 49% (per OT evaluation note) Assistive Device Comment: Long handled sponge, seated bathing (patient would benefit from long handle sponge and is reported as having a raise toilet already in place.)   Upper Body Dressing/Undressing Upper body dressing   What is the patient wearing?: Pull over shirt    Upper body assist Assist Level: Minimal Assistance - Patient > 75% (secondary to O2 canal placement.)    Lower Body Dressing/Undressing Lower body  dressing      What is the patient wearing?: Pants, Underwear/pull up     Lower body assist Assist for lower body dressing: Maximal Assistance - Patient 25 - 49% (Secondary to fatigue and challenges with active range) Assistive Device Comment: with reacher (Patient would benefit from additional practice)   Toileting Toileting Toileting Activity did not occur (Clothing management and hygiene only): Refused  Toileting assist Assist for toileting: Minimal Assistance - Patient > 75%     Transfers Chair/bed transfer  Transfers assist  Chair/bed transfer activity did not occur: Refused (additional assessing needed in this area)  Chair/bed transfer assist level: Contact Guard/Touching assist Chair/bed transfer assistive device: Programmer, multimedia   Ambulation assist   Ambulation activity did not occur: Refused (additional assessing needed in this area)  Assist level: Contact Guard/Touching assist Assistive device: Walker-rolling Max distance: 40f   Walk 10 feet activity   Assist  Walk 10 feet activity did not occur: Safety/medical concerns (weakness, fatigue, deconditioning)  Assist level: Contact Guard/Touching assist Assistive device: Walker-rolling   Walk 50 feet activity   Assist Walk 50 feet with 2 turns activity did not occur: Safety/medical concerns (weakness, fatigue, deconditioning)         Walk 150 feet activity   Assist Walk 150 feet activity did not occur: Safety/medical concerns (weakness, fatigue, deconditioning)         Walk 10 feet on uneven surface  activity   Assist Walk 10 feet on uneven surfaces activity did not occur: Safety/medical concerns (weakness, fatigue, deconditioning)         Wheelchair     Assist Is the patient using a wheelchair?: Yes Type of Wheelchair: Manual Wheelchair activity did not occur: Safety/medical concerns (weakness, fatigue, deconditioning)         Wheelchair 50 feet with 2 turns  activity    Assist    Wheelchair 50 feet with 2 turns activity did not occur: Safety/medical concerns (weakness, fatigue, deconditioning)       Wheelchair 150 feet activity     Assist  Wheelchair 150 feet activity did not occur: Safety/medical concerns (weakness, fatigue, deconditioning)       Blood pressure (!) 128/54, pulse 79, temperature (!) 97.4 F (36.3 C), temperature source Oral, resp. rate 18, height '5\' 4"'  (1.626 m), weight (!) 156.8 kg, last menstrual period 07/17/2012, SpO2 94 %.    Medical Problem List and Plan: 1. Functional deficits secondary to debility/acute on chronic hypercapnia respiratory failure.  Oxygen therapy as directed/BiPAP             -patient may  shower             -ELOS/Goals: 16 days Mod I goals for mobility and self care   Discussed d/c date with her  Continue CIR 2.  Antithrombotics: -DVT/anticoagulation:  Pharmaceutical: Other (comment) Eliquis             -antiplatelet therapy: N/A 3. Pain from pressure injury: improved, Neurontin 300 mg  nightly, decrease tramadol to q12H prn for moderate pain 4. Mood: Melatonin as needed             -antipsychotic agents: N/A 5. Neuropsych: This patient is capable of making decisions on her own behalf. 6. Skin/Wound Care: Routine skin checks 7. Fluids/Electrolytes/Nutrition: Routine in and outs with follow-up chemistries 8.  End-stage renal disease.  Continue hemodialysis as per renal services.  Permacath placement 02/25/2021.Marland Kitchen Discussed her recent creatinine levels with her 9.  Hypotension   Norvasc 5 mg daily, Toprol XL 25 mg daily.  Monitor with increased mobility. Decrease Tramadol to q8H prn.  10.  History of atrial fibrillation/pulmonary emboli/acute on chronic CHF.  Continue Eliquis.  Cardiac rate controlled.  Continue beta-blocker.Monitor for fluid overload Vitals:   04/01/21 1920 04/02/21 0410  BP: (!) 123/54 (!) 128/54  Pulse: 85 79  Resp: 18 18  Temp: 97.9 F (36.6 C) (!) 97.4 F (36.3  C)  SpO2: 91% 94%    11.  Hypothyroidism.  Synthroid 12.  Hyperlipidemia.  Lipitor 13.  History of chronic anemia. 14.  Diabetes mellitus.  Latest hemoglobin A1c 7.2.  SSI. Placed nursing order for no fluids other than water.  15.  History of hematuria.  Left ureteral stone with recent ureteroscopy/laser lithotripsy.  Repeat ultrasound showed resolution of left hydronephrosis.  Follow-up Dr.Sninsky. 16.  Morbid obesity.  BMI 62.74-->60.55. Provided dietary education.  17.  Acute blood loss anemia/leukocytosis.  Follow-up chemistries 18.  Urine culture positive ESBL/Proteus/E. coli.  Treat empirically with  one dose fosfomycin 2/9.  Contact precautions. Discussed with her that she will need these until day of discharge.  19.Leukocytosis.  Resolved, continue to monitor w/ HD. Follow-up per infectious disease Dr.Ravisankar and presently maintained on IV Invanz x 4 does. 20. Sensation of water in left ear: debrox and antibiotic drops ordered.  21. Right ankle pain related to osteoarthritis, will start Voltaren gel has as needed oxycodone for more severe pain  LOS: 5 days A FACE TO FACE EVALUATION WAS PERFORMED  Charlett Blake 04/02/2021, 10:57 AM

## 2021-04-02 NOTE — Progress Notes (Signed)
Occupational Therapy Session Note  Patient Details  Name: Nicole Schmidt MRN: [id number] Date of Birth: 05-14-1965  Today's Date: 04/03/2021 OT Individual Time: 9833-8250 OT Individual Time Calculation (min): 68 min    Short Term Goals: Week 1:  OT Short Term Goal 1 (Week 1): STG = LTG 2/2 ELOS  Skilled Therapeutic Interventions/Progress Updates:    Pt greeted sitting up in the w/c. Reporting that her Rt ankle was still bothering her. She was asking OT to wash her hair. We used the hair washing tray to wash pts hair and then pt worked on [employer] strength/endurance by brushing out hair and then using the hair-dryer. Pts affect visibly brightened afterwards. Pt eating ice chips from cup and dropped cup x2. She reports that she has very weak hand strength/grip. Dynamometer testing performed with reading of 29# strength Rt hand 20# Lt. Issued pt yellow theraputty and reviewed/completed exercises to complete to improve functional hand grip/strength. Pt then requested to return to bed. Attempted sit<stand multiple times using the RW however pt unable to due to Rt ankle pain. Pt due for scheduled pain medicine in ~10 more minutes. She remained sitting up, left with all needs within reach.   Therapy Documentation Precautions:  Precautions Precautions: Fall Precaution Comments: monitor O2 Restrictions Weight Bearing Restrictions: No RLE Weight Bearing: Partial weight bearing LLE Weight Bearing: Partial weight bearing  Pain: pt premedicated for pain and not due for any more   ADL: ADL Equipment Provided: Long-handled sponge, Reacher Eating: Independent Where Assessed-Eating:  (patient not observed in this area, however, the pt is s/u in this area of function) Grooming: Setup Where Assessed-Grooming: Wheelchair (patient able to stand for short periods of time secondary to deconditioning.) Upper Body Bathing: Setup (patient was able to simulate bathing UB at w/c LOF with setup) Where Assessed-Upper  Body Bathing: Chair Lower Body Bathing: Maximal assistance Where Assessed-Lower Body Bathing: Chair Upper Body Dressing: Setup Where Assessed-Upper Body Dressing: Chair Lower Body Dressing: Maximal assistance Where Assessed-Lower Body Dressing: Chair Toileting: Dependent Where Assessed-Toileting: Other (Comment) (patient is currently using the bed pan with a desire to transition to previous LOF, Per pt report.) Toilet Transfer: Not assessed Toilet Transfer Method: Not assessed Tub/Shower Transfer: Not assessed Gaffer Transfer: Not assessed  Therapy/Group: Individual Therapy  Nicole Schmidt A Nicole Schmidt 04/03/2021, 4:19 PM

## 2021-04-03 ENCOUNTER — Inpatient Hospital Stay (HOSPITAL_COMMUNITY): Payer: BC Managed Care – PPO

## 2021-04-03 LAB — GLUCOSE, CAPILLARY
Glucose-Capillary: 121 mg/dL — ABNORMAL HIGH (ref 70–99)
Glucose-Capillary: 158 mg/dL — ABNORMAL HIGH (ref 70–99)
Glucose-Capillary: 107 mg/dL — ABNORMAL HIGH (ref 70–99)
Glucose-Capillary: 128 mg/dL — ABNORMAL HIGH (ref 70–99)

## 2021-04-03 MED ORDER — SODIUM CHLORIDE 0.9 % IV SOLN
250.0000 mg | INTRAVENOUS | Status: AC
  Administered 2021-04-05 – 2021-04-14 (×5): 250 mg via INTRAVENOUS
  Filled 2021-04-03 (×7): qty 20

## 2021-04-03 NOTE — Progress Notes (Signed)
Occupational Therapy Session Note  Patient Details  Name: Nicole Schmidt MRN: [id number] Date of Birth: 1965-08-31  Today's Date: 04/03/2021 OT Individual Time: 0930-1000 OT Individual Time Calculation (min): 30 min  and Today's Date: 04/03/2021 OT Missed Time: 30 Minutes Missed Time Reason: Other (comment) (pt O2 levels decrease to 70% with activity on 6L O2)   Short Term Goals: Week 1:  OT Short Term Goal 1 (Week 1): STG = LTG 2/2 ELOS  Skilled Therapeutic Interventions/Progress Updates:    Pt sleeping in bed with BIPAP in place and O2 >90%. BIPAP removed and pt placed on [place] O2 4L and O2>90%. Pt engaged in BUE therex with 3# bar. O2 sats decreased to 70%-80% with significant decreased in pt's ability to complete BUE therex; pt's eyes closed and BUE tremors noted. O2 sats improved >90% with rest. Attempted BUE therex X 2 with similar results. Pt commented that she noticed her BUE were "swollen" this morning. LPN Josh notified of pt's status. Pt missed 30 mins skilled OT services.   Therapy Documentation Precautions:  Precautions Precautions: Fall Precaution Comments: monitor O2 Restrictions Weight Bearing Restrictions: No RLE Weight Bearing: Partial weight bearing LLE Weight Bearing: Partial weight bearing General: General OT Amount of Missed Time: 30 Minutes Pain:  Pt denies pain this morning   Therapy/Group: Individual Therapy  Leroy Libman 04/03/2021, 10:18 AM

## 2021-04-03 NOTE — Progress Notes (Signed)
Patient desaturated after removing BIPAP mask, patient initially placed on 8L and saturations improved. Patient's BIPAP settings changed to 18/8 with 6L bled in. Patient currently tolerating well, RT will continue to monitor .

## 2021-04-03 NOTE — Progress Notes (Signed)
Cxr reviewed Diffuse pulm congestion. Will plan for a short sequential treatment for UF later today once HD RN available.  Gean Quint, MD [facility]

## 2021-04-03 NOTE — Progress Notes (Signed)
[facility] Progress Note    Assessment/ Plan:   Assessment/Plan:  Debility - following prolonged hospitalizations.  In CIR.   Recent ESBL UTI/Hematuria/L ureteral stone sp stent placement on 02/14/21 -  Repeat US with resolution for hydronephrosis.  F/u urology at OP. On ertapenem until 2/16 Chronic respiratory failure/hypercapnic respiratory failure - on 2L O2 prior to last admission. Uses BiPAP while sleeping with noted improvement before Acute hypoxia/AMS changes overnight despite 3L UF. Not sure if this is related to fluid, portable CXR ordered for this AM. Possibly hypercapnic-will defer to primary service to obtain blood gas  ESRD -  on HD TTS. Can not come up to HD on BiPAP QHS so avoid night time dialysis. Patient wishes to pursue permanent access placement as an outpatient, will hold off on consulting [facility]. Next HD today 2/21 or sooner depending on CXR findings  Hypertension/volume  - Blood pressure well controlled.  Hypotension limits UF, may need to add midodrine for BP support.  Use albumin. Continue home meds.  Uf'ing as tolerated. Probably has some chronic venous insuffiencey as well  Anemia of CKD - Hgb 8.2- 2/18. Aranesp 122mcg qwk. Fe load ordered to start 2/21  Secondary Hyperparathyroidism -  Calcium and phos in goal.  Continue VDRA.  renal diet, on revela. Cal trending up, consider holding os-cal  Nutrition - Renal diet w/fluid restrictions.  Hx A fib - on eliquis. DMT2 - per PMD Access: rij tdc. Patient wishes to have her AVF/AVG done as an outpatient/post discharge which is reasonable Dispo: EDD 3/1  Outpatient Dialysis Orders:  [facility] TTS [address] RIJ permcath  10000 unit EPO qHD Calcitriol 0.25mg  qd  Gean Quint, MD [facility]  Subjective:   Pt seen and examined and examined in room. Tolerated HD yesterday w/ net uf 3L Hypoxic/disoriented overnight. Not fully arousable this AM. Discussed w/ RN Cxr ordered   Objective:    BP (!) 143/73 (BP Location: Left Arm)    Pulse 88    Temp 98.9 F (37.2 C) (Axillary)    Resp 12    Ht 5\' 4"  (1.626 m)    Wt (!) 157 kg    LMP 07/17/2012    SpO2 93%    BMI 59.41 kg/m   Intake/Output Summary (Last 24 hours) at 04/03/2021 5400 Last data filed at 04/02/2021 1621 Gross per 24 hour  Intake --  Output 3000 ml  Net -3000 ml   Weight change:   Physical Exam: Gen: somnolent CVS:rrr, s1s2 Resp: poor air exchange b/l, on bipap QQP:[id number], soft Ext: trace pitting edema b/l Les Neuro: opens eyes intermittently w/ tactile stimuli Dialysis access: RIJ TDC c/d/i  Imaging: DG Ankle Complete Right  Result Date: 04/01/2021 CLINICAL DATA:  Right ankle pain EXAM: RIGHT ANKLE - COMPLETE 3+ VIEW COMPARISON:  11/20/2004 FINDINGS: Frontal, oblique, lateral views of the right ankle are obtained. Evidence of prior healed lateral malleolar fracture. No acute fracture, subluxation, or dislocation. 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 Prominent superior and inferior calcaneal spurs. Mild diffuse subcutaneous edema. IMPRESSION: 1. Mild osteoarthritis. 2. No acute displaced fracture. 3. Prominent calcaneal spurs. Electronically Signed   By: Randa Ngo M.D.   On: 04/01/2021 17:06    Labs: BMET Recent Labs  Lab 03/28/21 0920 03/28/21 1836 03/31/21 1259 04/02/21 1257  NA 130* 131* 129* 128*  K 3.7 3.9 4.2 5.0  CL 95* 95* 93* 93*  CO2 27 24 27 27   GLUCOSE 125* 153* 117*  133*  BUN 48* 51* 45* 40*  CREATININE 5.32* 5.84* 5.36* 4.92*  CALCIUM 9.4 9.4 10.6* 10.0  PHOS 4.9* 4.8* 5.9* 8.0*   CBC Recent Labs  Lab 03/28/21 0920 03/29/21 1432 04/02/21 1257  WBC 11.4* 10.3 10.1  HGB 8.5* 8.1* 8.2*  HCT 30.3* 29.0* 29.9*  MCV 98.4 98.0 100.3*  PLT 307 300 287    Medications:     amLODipine  5 mg Oral Daily   apixaban  5 mg Oral BID   atorvastatin  20 mg Oral QHS   bisacodyl  10 mg Rectal Daily   calcitRIOL  0.25 mcg Oral Daily   calcium carbonate   1,250 mg Oral Q breakfast   carbamide peroxide  5 drop Left EAR BID   chlorhexidine  15 mL Mouth Rinse BID   Chlorhexidine Gluconate Cloth  6 each Topical Q0600   [START ON 04/07/2021] darbepoetin (ARANESP) injection - DIALYSIS  100 mcg Intravenous Q Thu-HD   diclofenac Sodium  2 g Topical QID   famotidine  10 mg Oral Daily   feeding supplement (NEPRO CARB STEADY)  237 mL Oral BID BM   folic acid  9,242 mcg Oral Daily   gabapentin  300 mg Oral QHS   Gerhardt's butt cream  3 application Topical TID   insulin aspart  0-15 Units Subcutaneous TID WC   levothyroxine  112 mcg Oral QAC breakfast   mouth rinse  15 mL Mouth Rinse q12n4p   metoprolol succinate  25 mg Oral Daily   multivitamin  1 tablet Oral QHS   neomycin-polymyxin-hydrocortisone  3 drop Left EAR Q8H   sevelamer carbonate  800 mg Oral TID WC   Vitamin D (Ergocalciferol)  50,000 Units Oral Weekly      Gean Quint, MD [facility] 04/03/2021, 7:48 AM

## 2021-04-03 NOTE — Progress Notes (Signed)
Physical Therapy Session Note  Patient Details  Name: Nicole Schmidt MRN: [id number] Date of Birth: 1965-03-26  Today's Date: 04/03/2021 PT Individual Time: 1310-1410 PT Individual Time Calculation (min): 60 min   Short Term Goals: Week 1:  PT Short Term Goal 1 (Week 1): pt will perform transfer with LRAD and CGA PT Short Term Goal 2 (Week 1): pt will ambulate 48ft with LRAD and CGA PT Short Term Goal 3 (Week 1): pt will initate stair training  Skilled Therapeutic Interventions/Progress Updates:    Pt presents in bed getting ready to eat lunch. Relays events of issues with breathing status last night and issues earlier this AM. Had reviewed this information with LPN prior to session and pt is clear for mobility to tolerance while monitoring vitals (see details below regarding continuous pulse ox issues). Pt agreeable to attempt lunch EOB. Min assist for supine to sit using HOB elevated and rails for support, requiring assist with trunk. Maintains functional dynamic sitting balance including scooting with supervision during self feeding tasks. Pt noted to have some tremors bilaterally in UE's. Dropped items occasionally. While eating engaged in discussion and education about d/c planning, energy conservation, home access (ramp vs stairs and inside of the home), and support. Pt does at times appear to repeat herself when we had already covered a topic but generally displays no other signs of disorientation or confusion (pt aware of place, situation, what basketball game was on, etc and took 2 phone calls during this time. Did mix up super bowl and another sporting event x 2 on a phone call but otherwise recall was the same). Once fnished, pt agreeable to attempt OOB to the chair. Several attempts needed but able to perform si t> stand with RW with min assist and then stand step transfer with extra time and several small steps to the w/c. Pt also reports R ankle has been giving her an issue lately which  was limiting her full WB through the RLE during standing transfer and steps. Pt aware that OT to come in the next 15 min and all needs in reach.   Therapy Documentation Precautions:  Precautions Precautions: Fall Precaution Comments: monitor O2 Restrictions Weight Bearing Restrictions: No     Vital Signs: 4L 02 via [place] throughout - when monitored sats ranged 85-96% Continuous pulse ox was having a difficult time maintaining a reading during feeding and mobility so just spot checked with dynmap O2 reader and went off pt report and appearance. Notified RN and NT of this as well.   Pain: Denies pain. Just reports incidents from last night and this morning.     Therapy/Group: Individual Therapy  Canary Brim Ivory Broad, PT, DPT, CBIS  04/03/2021, 2:18 PM

## 2021-04-03 NOTE — Progress Notes (Signed)
Report given to HD RN at this time

## 2021-04-03 NOTE — Progress Notes (Signed)
Pt transported to HD at this time.

## 2021-04-03 NOTE — Progress Notes (Addendum)
Patient removed bipap and desaturated into the 30's and was disoriented and drowsy upon entering the room. Increased O2 NCL to 8L and RT refit bipap on 6L. Placed on continous pulse ox to monitor. Patient was not fully arousable at shift change which is different from baseline. She opened her eyes but did not know where she was when questioned.

## 2021-04-04 LAB — GLUCOSE, CAPILLARY
Glucose-Capillary: 99 mg/dL (ref 70–99)
Glucose-Capillary: 121 mg/dL — ABNORMAL HIGH (ref 70–99)
Glucose-Capillary: 121 mg/dL — ABNORMAL HIGH (ref 70–99)
Glucose-Capillary: 104 mg/dL — ABNORMAL HIGH (ref 70–99)

## 2021-04-04 MED ORDER — MIDODRINE HCL 5 MG PO TABS
10.0000 mg | ORAL_TABLET | ORAL | Status: DC
  Administered 2021-04-05 – 2021-04-14 (×5): 10 mg via ORAL
  Filled 2021-04-04 (×7): qty 2

## 2021-04-04 MED ORDER — METOPROLOL SUCCINATE ER 25 MG PO TB24
12.5000 mg | ORAL_TABLET | Freq: Every day | ORAL | Status: DC
  Administered 2021-04-05 – 2021-04-11 (×7): 12.5 mg via ORAL
  Filled 2021-04-04 (×7): qty 1

## 2021-04-04 MED ORDER — HEPARIN SODIUM (PORCINE) 1000 UNIT/ML IJ SOLN
INTRAMUSCULAR | Status: AC
  Filled 2021-04-04: qty 1

## 2021-04-04 NOTE — Progress Notes (Signed)
Occupational Therapy Session Note  Patient Details  Name: Nicole Schmidt MRN: [id number] Date of Birth: 02-26-1965  Today's Date: 04/04/2021 OT Individual Time: 2248-2500 OT Individual Time Calculation (min): 57 min    Short Term Goals: Week 1:  OT Short Term Goal 1 (Week 1): STG = LTG 2/2 ELOS  Skilled Therapeutic Interventions/Progress Updates:  Skilled OT intervention completed with focus on bed mobility, endurance, pain management. Pt received semi-supine in bed, asleep with bi-pap on, with multimodal stimuli needed to wake pt. Pt very fatigued during session, with pt moving very slow and c/o pain in R ankle with inability to stand. Educated pt on importance of staying awake and trying to sit up during day for increased endurance and energy for most efficient therapy time and overall health. Pt completed very slow bed mobility from semi-upright to EOB with HHA at mod A, pt using bed rail for assist and min A needed to help pt scoot hips forward. Encouraged pt to stand while EOB, however declined due to pain, with therapist and pt waiting for nursing assist. While EOB, pt participated in BLE edema management with knee extension 2x20, knee raises 2x20, and dorsi/plantar flexion 2x10. Pt required visual cues for completing full ROM and to encourage pt to stay awake as she often nodded off. Pt completed BUE endurance exercises with folded up blanket for light weight, including chest presses 2x20, russian twists 2x20. Pt required increased time for energy recovery and pt participation. Pt requesting to return to bed until nursing comes with meds, with therapist encouraging pt to scoot while EOB to get further to [facility], however pt declined. Pt required min A for lifting BLEs, and +2 assist to boost pt up into bed with pt barely assisting with L foot on bed. Nurse in room to provide meds. Pt reported that she plans to make phone calls while sitting up in bed so bi-pap left off, and therapist informed pt that  if she feels herself getting tired to ask for assist to donn the bi-pap. Pt left upright in bed, with nurse in room, bed alarm on and all needs in reach at end of session.    O2 sats Received semi-supine in bed with bipap on 5 L, 80% sat Sitting EOB, 6 L, 89% sat, improved to 95% after rest and breathing techniques Sitting EOB, 6L, 95% sat during exercises and at end of session    Therapy Documentation Precautions:  Precautions Precautions: Fall Precaution Comments: monitor O2 Restrictions Weight Bearing Restrictions: No   Pain: 7/10 in R ankle, notified nurse for pain meds   Therapy/Group: Individual Therapy  Nicole Schmidt E [name] 04/04/2021, 7:39 AM

## 2021-04-04 NOTE — Progress Notes (Signed)
Physical Therapy Session Note  Patient Details  Name: Nicole Schmidt MRN: [id number] Date of Birth: [birth year]  Today's Date: 04/04/2021 PT Individual Time: 4098-1191 PT Individual Time Calculation (min): 77 min   Short Term Goals: Week 1:  PT Short Term Goal 1 (Week 1): pt will perform transfer with LRAD and CGA PT Short Term Goal 2 (Week 1): pt will ambulate 25ft with LRAD and CGA PT Short Term Goal 3 (Week 1): pt will initate stair training  Skilled Therapeutic Interventions/Progress Updates:   Received pt semi-reclined in bed asleep. Upon wakening pt agreeable to PT treatment and reported pain 8/10 in R ankle. RN notified and present twice during session to administer pain medication and insulin. Pt on 5L O2 via [place] with sats 95%; decreased to 4L but O2 sat dropped to low 80's with movement. Therefore increased back to 5L and O2 sats increased to >88%. Pt reported having incident the other night where O2 dropped into the 30's and since then has required 5L. Session with emphasis on functional mobility/transfers, generalized strengthening and endurance, and dynamic standing balance/coordination. Pt transferred semi-reclined<>sitting EOB with min/mod HHA and sat EOB for a few minutes to catch her breath - required mod A to scoot hips to EOB. Also noticed increased tremors in BUE today impacting pt's ability to secure grip on RW. Attempted to stand from elevated EOB, however pt unable to transfer RUE from bed to RW handgrip and when letting go of bedrail, lost balance posteriorly onto bed. On trial 2 and 3 standing, pt able to stand with mod A with therapist stabilizing LUE on RW. Pt able to remain standing for ~45 seconds but unable to march in place or advance LEs to ambulate to bathroom. Pt required numerous extended seated rest breaks throughout session due to fatigue and SOB and was extremely slow to initiate movement. Attempted to stand for a 4th time but pt unable despite 2 attempts and max A,  most likely due to fatigue. Sit<>supine with max A for BLE management and scooted to [facility] with pt pulling on headboard, max A of 1, and use of Trendelenburg bed position. Concluded session with pt semi-reclined in bed, needs within reach, and bed alarm on. Encouraged pt to notify nursing to don bipap if she plans on falling asleep, however pt reported needing to make a few phone calls. Pt left on 5L O2 with sats 93%.   Therapy Documentation Precautions:  Precautions Precautions: Fall Precaution Comments: monitor O2 Restrictions Weight Bearing Restrictions: No  Therapy/Group: Individual Therapy Alfonse Alpers PT, DPT   04/04/2021, 7:40 AM

## 2021-04-04 NOTE — Progress Notes (Signed)
RT note. Patient on 5L [place] sat 92%, will place patient on cpap when napping. Told patient to let RN know when she wants to be placed on. RT will continue to monitor.

## 2021-04-04 NOTE — Progress Notes (Signed)
PROGRESS NOTE   Subjective/Complaints: Ankle XR stable Sleeping with CPAP on and sat dropped to 76- 6L [place] placed with sat increasing to 92. CXR shows pulmonary vascular congestion  ROS: +lower extremity edema, denies pain, SOB, +sensation of water in ear, +right ankle pain- chronic, +hypoxia  Objective:   DG CHEST PORT 1 VIEW  Result Date: 04/03/2021 CLINICAL DATA:  Hypoxia.  History of diabetes, hypertension and CHF. EXAM: PORTABLE CHEST 1 VIEW COMPARISON:  03/26/2021 FINDINGS: There is a right chest wall dialysis catheter with tips in the distal SVC and cavoatrial junction. Stable cardiomediastinal contours. Aortic atherosclerosis. Diffuse pulmonary vascular congestion. Platelike atelectasis is identified in the right upper lobe with improvement in previous right upper lobe airspace disease. No new findings. IMPRESSION: 1. Improving aeration to the right upper lobe. 2. Pulmonary vascular congestion. Electronically Signed   By: Kerby Moors M.D.   On: 04/03/2021 08:00   Recent Labs    04/02/21 1257  WBC 10.1  HGB 8.2*  HCT 29.9*  PLT 287   Recent Labs    04/02/21 1257  NA 128*  K 5.0  CL 93*  CO2 27  GLUCOSE 133*  BUN 40*  CREATININE 4.92*  CALCIUM 10.0    Intake/Output Summary (Last 24 hours) at 04/04/2021 1256 Last data filed at 04/04/2021 0748 Gross per 24 hour  Intake 413 ml  Output 3000 ml  Net -2587 ml        Physical Exam: Vital Signs Blood pressure (!) 117/57, pulse 92, temperature 98.6 F (37 C), temperature source Oral, resp. rate 20, height '5\' 4"'  (1.626 m), weight (!) 156.5 kg, last menstrual period 07/17/2012, SpO2 90 %.  General: No acute distress, BMI 59.22 Mood and affect are appropriate Heart: Regular rate and rhythm no rubs murmurs or extra sounds Lungs: Clear to auscultation, breathing unlabored, no rales or wheezes Abdomen: Positive bowel sounds, soft nontender to palpation,  nondistended Extremities: No clubbing, cyanosis, or edema  Skin: Stasis dermatitis bilateral pre tibial  Neurologic: Cranial nerves II through XII intact, motor strength is 5/5 in bilateral deltoid, bicep, tricep, grip, 3-/ 5 hip flexor, knee extensors, ankle dorsiflexor and plantar flexor Sensory exam normal sensation to light touch and proprioception in bilateral upper and lower extremities. + allodynia of lower extremities Cerebellar exam normal finger to nose to finger as well as heel to shin in bilateral upper and lower extremities Musculoskeletal: Full range of motion in all 4 extremities. No joint swelling.  Pain with right ankle dorsiflexion no evidence of erythema or skin lesions   Assessment/Plan: 1. Functional deficits which require 3+ hours per day of interdisciplinary therapy in a comprehensive inpatient rehab setting. Physiatrist is providing close team supervision and 24 hour management of active medical problems listed below. Physiatrist and rehab team continue to assess barriers to discharge/monitor patient progress toward functional and medical goals  Care Tool:  Bathing    Body parts bathed by patient: Right arm, Left arm, Chest, Abdomen, Front perineal area, Right upper leg, Left upper leg, Face (Patient was able to simulate bathing UB, but was met with challenges for LB funciton for lower legs and feet.)   Body parts bathed by  helper: Right lower leg, Left lower leg, Buttocks (Areas the pt was unable to address)     Bathing assist Assist Level: Maximal Assistance - Patient 24 - 49% (per OT evaluation note) Assistive Device Comment: Long handled sponge, seated bathing (patient would benefit from long handle sponge and is reported as having a raise toilet already in place.)   Upper Body Dressing/Undressing Upper body dressing   What is the patient wearing?: Pull over shirt    Upper body assist Assist Level: Minimal Assistance - Patient > 75% (secondary to O2 canal  placement.)    Lower Body Dressing/Undressing Lower body dressing      What is the patient wearing?: Pants, Underwear/pull up     Lower body assist Assist for lower body dressing: Maximal Assistance - Patient 25 - 49% (Secondary to fatigue and challenges with active range) Assistive Device Comment: with reacher (Patient would benefit from additional practice)   Toileting Toileting Toileting Activity did not occur (Clothing management and hygiene only): Refused  Toileting assist Assist for toileting: Minimal Assistance - Patient > 75%     Transfers Chair/bed transfer  Transfers assist  Chair/bed transfer activity did not occur: Refused (additional assessing needed in this area)  Chair/bed transfer assist level: Minimal Assistance - Patient > 75% Chair/bed transfer assistive device: Walker, Clinical biochemist   Ambulation assist   Ambulation activity did not occur: Refused (additional assessing needed in this area)  Assist level: Contact Guard/Touching assist Assistive device: Walker-rolling Max distance: 73f   Walk 10 feet activity   Assist  Walk 10 feet activity did not occur: Safety/medical concerns (weakness, fatigue, deconditioning)  Assist level: Contact Guard/Touching assist Assistive device: Walker-rolling   Walk 50 feet activity   Assist Walk 50 feet with 2 turns activity did not occur: Safety/medical concerns (weakness, fatigue, deconditioning)         Walk 150 feet activity   Assist Walk 150 feet activity did not occur: Safety/medical concerns (weakness, fatigue, deconditioning)         Walk 10 feet on uneven surface  activity   Assist Walk 10 feet on uneven surfaces activity did not occur: Safety/medical concerns (weakness, fatigue, deconditioning)         Wheelchair     Assist Is the patient using a wheelchair?: Yes Type of Wheelchair: Manual Wheelchair activity did not occur: Safety/medical concerns  (weakness, fatigue, deconditioning)         Wheelchair 50 feet with 2 turns activity    Assist    Wheelchair 50 feet with 2 turns activity did not occur: Safety/medical concerns (weakness, fatigue, deconditioning)       Wheelchair 150 feet activity     Assist  Wheelchair 150 feet activity did not occur: Safety/medical concerns (weakness, fatigue, deconditioning)       Blood pressure (!) 117/57, pulse 92, temperature 98.6 F (37 C), temperature source Oral, resp. rate 20, height '5\' 4"'  (1.626 m), weight (!) 156.5 kg, last menstrual period 07/17/2012, SpO2 90 %.    Medical Problem List and Plan: 1. Functional deficits secondary to debility/acute on chronic hypercapnia respiratory failure.  Oxygen therapy as directed/BiPAP             -patient may  shower             -ELOS/Goals: 16 days Mod I goals for mobility and self care   Discussed d/c date with her  Continue CIR 2.  Antithrombotics: -DVT/anticoagulation:  Pharmaceutical: Other (comment) Eliquis             -  antiplatelet therapy: N/A 3. Pain from pressure injury: improved, Neurontin 300 mg nightly, decrease tramadol to q12H prn for moderate pain 4. Mood: Melatonin as needed             -antipsychotic agents: N/A 5. Neuropsych: This patient is capable of making decisions on her own behalf. 6. Skin/Wound Care: Routine skin checks 7. Fluids/Electrolytes/Nutrition: Routine in and outs with follow-up chemistries 8.  End-stage renal disease.  Continue hemodialysis as per renal services.  Permacath placement 02/25/2021.Marland Kitchen Discussed her recent creatinine levels with her 9.  Hypotension   Norvasc 5 mg daily, decrease Toprol XL to 12.5 mg daily.  Monitor with increased mobility. Decrease Tramadol to q8H prn.  10.  History of atrial fibrillation/pulmonary emboli/acute on chronic CHF.  Continue Eliquis.  Cardiac rate controlled.  Continue beta-blocker.Monitor for fluid overload Vitals:   04/04/21 0808 04/04/21 0927  BP:     Pulse:    Resp:    Temp:    SpO2: 92% 90%    11.  Hypothyroidism.  Synthroid 12.  Hyperlipidemia.  Lipitor 13.  History of chronic anemia. 14.  Diabetes mellitus.  Latest hemoglobin A1c 7.2.  SSI. Placed nursing order for no fluids other than water.  15.  History of hematuria.  Left ureteral stone with recent ureteroscopy/laser lithotripsy.  Repeat ultrasound showed resolution of left hydronephrosis.  Follow-up Dr.Sninsky. 16.  Morbid obesity.  BMI 62.74-->60.55. Provided dietary education.  17.  Acute blood loss anemia/leukocytosis.  Follow-up chemistries 18.  Urine culture positive ESBL/Proteus/E. coli.  Treat empirically with  one dose fosfomycin 2/9.  Contact precautions. Discussed with her that she will need these until day of discharge.  19.Leukocytosis.  Resolved, continue to monitor w/ HD. Follow-up per infectious disease Dr.Ravisankar and presently maintained on IV Invanz x 4 does. 20. Sensation of water in left ear: debrox and antibiotic drops ordered, continue 21. Right ankle pain related to osteoarthritis, will start Voltaren gel has as needed oxycodone for more severe pain, placed nursing order to ice 15 minutes three times per day  LOS: 7 days A FACE TO FACE EVALUATION WAS PERFORMED  Clide Deutscher Phuc Kluttz 04/04/2021, 12:56 PM

## 2021-04-04 NOTE — Progress Notes (Signed)
Occupational Therapy Session Note  Patient Details  Name: Nicole Schmidt MRN: [id number] Date of Birth: 1965-05-24  Today's Date: 04/04/2021 OT Individual Time: 4996-9249 OT Individual Time Calculation (min): 55 min    Short Term Goals: Week 1:  OT Short Term Goal 1 (Week 1): STG = LTG 2/2 ELOS  Skilled Therapeutic Interventions/Progress Updates:    Pt supine reporting fatigue from having HD so late last night. Pt reporting moderate pain in her R foot, discussed pain regiment. Pt on 5L O2 continuous pulse Ox reading 90-92%. She transferred to EOB with mod A and then required extra time/cueing for body mechanics to scoot to the edge. She sat unsupported with no LOB for remainder of the session. She completed UB bathing with (S). Mod A for LB, including reaching/managing abdomen skin folds and peri areas. Pt attempted sit > stand from the EOB but was unable with max A. Pt returned to supine and required large bed boost to get scooted up. She was left supine with all needs met, bed alarm set.   Therapy Documentation Precautions:  Precautions Precautions: Fall Precaution Comments: monitor O2 Restrictions Weight Bearing Restrictions: No RLE Weight Bearing: Partial weight bearing LLE Weight Bearing: Partial weight bearing   Therapy/Group: Individual Therapy  Curtis Sites 04/04/2021, 6:25 AM

## 2021-04-04 NOTE — Progress Notes (Signed)
Called to room by therapy. Pt sat dropped to 76 while on CPAP. CPAP removed for therapy and pt placed on 6 lpm with sat rising to 92%.

## 2021-04-04 NOTE — Progress Notes (Addendum)
[facility] Progress Note    Assessment/ Plan:   Assessment/Plan:  Debility - following prolonged hospitalizations.  In CIR.   Chronic respiratory failure/hypercapnic respiratory failure - on 2L O2 prior to last admission. Uses BiPAP while sleeping with noted improvement before. CXR last night showed "congestion", extra HD done w/ 3 L off and SOB better. Had discussion w/ pt who is relatively new to dialysis. Needs to restrict fluid intake significantly or this problem of LE edema and SOB will be recurrent.   ESRD -  on HD TTS. Had extra HD overnight for SOB, 3L off. Next HD tomorrow.   Hypertension/volume  - Blood pressure soft today, have dc'd norvasc. OK to cont toprol xl 12.5 qd for afib. But low BP's limit UF. Will add midodrine 10 mg pre HD TTS.   Anemia of CKD - Hgb 8.2- 2/18. Aranesp 157mcg qwk. Fe load ordered to start 2/21  Secondary Hyperparathyroidism -  Calcium and phos in goal.  Continue VDRA.  renal diet, on revela. Cal trending up, consider holding os-cal  Recent ESBL UTI/Hematuria/L ureteral stone sp stent placement on 02/14/21 -  Repeat US with resolution for hydronephrosis.  F/u urology at OP. On ertapenem until 2/16 Nutrition - Renal diet w/fluid restrictions.  Hx A fib - on eliquis, toprol xl 12.5 qd. DMT2 - per PMD Access: RIJ TDC. Patient wishes to have her AVF/AVG done as an outpatient/post discharge which is reasonable Dispo: EDD 3/1  Kelly Splinter, MD 04/04/2021, 2:37 PM    Outpatient Dialysis Orders:  [facility] TTS [address] RIJ permcath  10000 unit EPO qHD Calcitriol 0.25mg  qd  Subjective:   Pt seen and examined and examined in room. Tolerated HD yesterday w/ net uf 3L Hypoxic/disoriented overnight. Not fully arousable this AM. Discussed w/ RN Cxr ordered   Objective:   BP (!) 117/57 (BP Location: Left Arm)    Pulse 92    Temp 98.6 F (37 C) (Oral)    Resp 20    Ht 5\' 4"  (1.626 m)    Wt (!) 156.5 kg    LMP 07/17/2012    SpO2 90%    BMI  59.22 kg/m   Intake/Output Summary (Last 24 hours) at 04/04/2021 1429 Last data filed at 04/04/2021 8676 Gross per 24 hour  Intake 177 ml  Output 3000 ml  Net -2823 ml    Weight change: 2.2 kg  Physical Exam: Gen: somnolent CVS:rrr, s1s2 Resp: poor air exchange b/l, on bipap HMC:NOBSJ, soft Ext: trace pitting edema b/l Les Neuro: opens eyes intermittently w/ tactile stimuli Dialysis access: RIJ [facility] c/d/i  Imaging: DG CHEST PORT 1 VIEW  Result Date: 04/03/2021 CLINICAL DATA:  Hypoxia.  History of diabetes, hypertension and CHF. EXAM: PORTABLE CHEST 1 VIEW COMPARISON:  03/26/2021 FINDINGS: There is a right chest wall dialysis catheter with tips in the distal SVC and cavoatrial junction. Stable cardiomediastinal contours. Aortic atherosclerosis. Diffuse pulmonary vascular congestion. Platelike atelectasis is identified in the right upper lobe with improvement in previous right upper lobe airspace disease. No new findings. IMPRESSION: 1. Improving aeration to the right upper lobe. 2. Pulmonary vascular congestion. Electronically Signed   By: Kerby Moors M.D.   On: 04/03/2021 08:00    Labs: BMET Recent Labs  Lab 03/28/21 1836 03/31/21 1259 04/02/21 1257  NA 131* 129* 128*  K 3.9 4.2 5.0  CL 95* 93* 93*  CO2 24 27 27   GLUCOSE 153* 117* 133*  BUN 51* 45* 40*  CREATININE 5.84*  5.36* 4.92*  CALCIUM 9.4 10.6* 10.0  PHOS 4.8* 5.9* 8.0*    CBC Recent Labs  Lab 03/29/21 1432 04/02/21 1257  WBC 10.3 10.1  HGB 8.1* 8.2*  HCT 29.0* 29.9*  MCV 98.0 100.3*  PLT 300 287     Medications:     amLODipine  5 mg Oral Daily   apixaban  5 mg Oral BID   atorvastatin  20 mg Oral QHS   bisacodyl  10 mg Rectal Daily   calcitRIOL  0.25 mcg Oral Daily   calcium carbonate  1,250 mg Oral Q breakfast   carbamide peroxide  5 drop Left EAR BID   chlorhexidine  15 mL Mouth Rinse BID   Chlorhexidine Gluconate Cloth  6 each Topical Q0600   [START ON 04/07/2021] darbepoetin (ARANESP)  injection - DIALYSIS  100 mcg Intravenous Q Thu-HD   diclofenac Sodium  2 g Topical QID   famotidine  10 mg Oral Daily   feeding supplement (NEPRO CARB STEADY)  237 mL Oral BID BM   folic acid  5,615 mcg Oral Daily   gabapentin  300 mg Oral QHS   Gerhardt's butt cream  3 application Topical TID   insulin aspart  0-15 Units Subcutaneous TID WC   levothyroxine  112 mcg Oral QAC breakfast   mouth rinse  15 mL Mouth Rinse q12n4p   [START ON 04/05/2021] metoprolol succinate  12.5 mg Oral Daily   multivitamin  1 tablet Oral QHS   neomycin-polymyxin-hydrocortisone  3 drop Left EAR Q8H   sevelamer carbonate  800 mg Oral TID WC   Vitamin D (Ergocalciferol)  50,000 Units Oral Weekly      Gean Quint, MD [facility] 04/04/2021, 2:29 PM

## 2021-04-04 NOTE — Progress Notes (Signed)
Contact person Altha Harm called for update, nurse made aware and will return call.Family made aware

## 2021-04-04 NOTE — Progress Notes (Signed)
RT note. Pt. Placed on cpap with 5 L bled in line. Sat 90%, rt will continue to monitor

## 2021-04-05 LAB — CBC
HCT: 29.1 % — ABNORMAL LOW (ref 36.0–46.0)
Hemoglobin: 8.1 g/dL — ABNORMAL LOW (ref 12.0–15.0)
MCH: 27.2 pg (ref 26.0–34.0)
MCHC: 27.8 g/dL — ABNORMAL LOW (ref 30.0–36.0)
MCV: 97.7 fL (ref 80.0–100.0)
Platelets: 318 10*3/uL (ref 150–400)
RBC: 2.98 MIL/uL — ABNORMAL LOW (ref 3.87–5.11)
RDW: 16.7 % — ABNORMAL HIGH (ref 11.5–15.5)
WBC: 14.3 10*3/uL — ABNORMAL HIGH (ref 4.0–10.5)
nRBC: 0.1 % (ref 0.0–0.2)

## 2021-04-05 LAB — RENAL FUNCTION PANEL
Albumin: 2.8 g/dL — ABNORMAL LOW (ref 3.5–5.0)
Anion gap: 9 (ref 5–15)
BUN: 71 mg/dL — ABNORMAL HIGH (ref 6–20)
CO2: 26 mmol/L (ref 22–32)
Calcium: 10 mg/dL (ref 8.9–10.3)
Chloride: 91 mmol/L — ABNORMAL LOW (ref 98–111)
Creatinine, Ser: 6.26 mg/dL — ABNORMAL HIGH (ref 0.44–1.00)
GFR, Estimated: 7 mL/min — ABNORMAL LOW (ref >60)
Glucose, Bld: 117 mg/dL — ABNORMAL HIGH (ref 70–99)
Phosphorus: 6.8 mg/dL — ABNORMAL HIGH (ref 2.5–4.6)
Potassium: 5.1 mmol/L (ref 3.5–5.1)
Sodium: 126 mmol/L — ABNORMAL LOW (ref 135–145)

## 2021-04-05 LAB — GLUCOSE, CAPILLARY
Glucose-Capillary: 88 mg/dL (ref 70–99)
Glucose-Capillary: 121 mg/dL — ABNORMAL HIGH (ref 70–99)
Glucose-Capillary: 118 mg/dL — ABNORMAL HIGH (ref 70–99)
Glucose-Capillary: 162 mg/dL — ABNORMAL HIGH (ref 70–99)

## 2021-04-05 MED ORDER — HEPARIN SODIUM (PORCINE) 1000 UNIT/ML DIALYSIS: 2000.0000 [IU] | INTRAMUSCULAR | Status: DC | PRN

## 2021-04-05 MED ORDER — HEPARIN SODIUM (PORCINE) 1000 UNIT/ML IJ SOLN
INTRAMUSCULAR | Status: AC
  Administered 2021-04-05: 1000 [IU]
  Filled 2021-04-05: qty 5

## 2021-04-05 NOTE — Progress Notes (Signed)
Physical Therapy Session Note  Patient Details  Name: Nicole Schmidt MRN: [id number] Date of Birth: November 27, 1965  Today's Date: 04/05/2021 PT Individual Time: 0900-0928 PT Individual Time Calculation (min): 28 min   Short Term Goals: Week 1:  PT Short Term Goal 1 (Week 1): pt will perform transfer with LRAD and CGA PT Short Term Goal 1 - Progress (Week 1): Progressing toward goal PT Short Term Goal 2 (Week 1): pt will ambulate 63f with LRAD and CGA PT Short Term Goal 2 - Progress (Week 1): Met PT Short Term Goal 3 (Week 1): pt will initate stair training PT Short Term Goal 3 - Progress (Week 1): Met Week 2:  PT Short Term Goal 1 (Week 2): pt will perform transfers with LRAD and CGA consistantly PT Short Term Goal 2 (Week 2): pt will ambulate 274fwith LRAD and supervision PT Short Term Goal 3 (Week 2): pt will perform simulated car transfer with LRAD and min A  Skilled Therapeutic Interventions/Progress Updates:  Patient received sitting up in wc, agreeable to PT. She reports pain in R ankle, did not rate. PT providing rest breaks, distractions and repositioning to assist with pain management. Patient originally on 5L O2 wall air. PT transferring patient to tank. Satting at 86-100% depending on whether patient was talking or not. Patient also with variable accuracy of reading. PT titrating patien to 3L and she remained at 92-100% O2 while completing the [place] with B LE. Patient returning to room in wc, needs within reach, NT at beside to continue care.   Therapy Documentation Precautions:  Precautions Precautions: Fall Precaution Comments: monitor O2 Restrictions Weight Bearing Restrictions: No RLE Weight Bearing: Partial weight bearing LLE Weight Bearing: Partial weight bearing     Therapy/Group: Individual Therapy  JeKaroline CaldwellPT, DPT, CBIS  04/05/2021, 7:35 AM

## 2021-04-05 NOTE — Progress Notes (Signed)
Physical Therapy Weekly Progress Note  Patient Details  Name: Nicole Schmidt MRN: [id number] Date of Birth: 1965-05-09  Beginning of progress report period: March 29, 2021 End of progress report period: April 05, 2021  Today's Date: 04/05/2021 PT Individual Time: 0800-0854 PT Individual Time Calculation (min): 54 min   Patient has met 2 of 3 short term goals. Pt demonstrates very slow progress towards long term goals. Pt is able to transfer semi-reclined<>sitting EOB with as little as supervision with increased time and effort but when fatigued can require up to mod HHA. Pt continues to require max A for sit<>supine for BLE management due to weakness, body habitus, and edema - plans on sleeping in lift chair at home. Pt currently requires light mod A/min A for sit<>stands and is able to ambulate ~67ft with RW and CGA. Pt is also able to navigate 8 3in steps with 2 handrails and CGA/min A with multiple standing rest breaks. Pt continues to be limited by SOB, fatigue, and generalized weakness and deconditioning.   Patient continues to demonstrate the following deficits muscle weakness, decreased cardiorespiratoy endurance and decreased oxygen support, decreased memory, and decreased standing balance, decreased postural control, and decreased balance strategies and therefore will continue to benefit from skilled PT intervention to increase functional independence with mobility.  Patient progressing toward long term goals..  Continue plan of care.  PT Short Term Goals Week 1:  PT Short Term Goal 1 (Week 1): pt will perform transfer with LRAD and CGA PT Short Term Goal 1 - Progress (Week 1): Progressing toward goal PT Short Term Goal 2 (Week 1): pt will ambulate 27ft with LRAD and CGA PT Short Term Goal 2 - Progress (Week 1): Met PT Short Term Goal 3 (Week 1): pt will initate stair training PT Short Term Goal 3 - Progress (Week 1): Met Week 2:  PT Short Term Goal 1 (Week 2): pt will perform  transfers with LRAD and CGA consistantly PT Short Term Goal 2 (Week 2): pt will ambulate 68ft with LRAD and supervision PT Short Term Goal 3 (Week 2): pt will perform simulated car transfer with LRAD and min A  Skilled Therapeutic Interventions/Progress Updates:  Ambulation/gait training;Discharge planning;Functional mobility training;Psychosocial support;Therapeutic Activities;Balance/vestibular training;Disease management/prevention;Neuromuscular re-education;Skin care/wound management;Therapeutic Exercise;Wheelchair propulsion/positioning;DME/adaptive equipment instruction;Pain management;UE/LE Strength taining/ROM;Community reintegration;Patient/family education;Stair training;UE/LE Coordination activities;Cognitive remediation/compensation;Splinting/orthotics   Today's Interventions Received pt semi-reclined in bed, pt agreeable to PT treatment, and reported pain in R ankle and R knee - did not rate (premedicated). Pt reports having and "incident" last night but doesn't remember what happened. Per nursing, pt fell asleep without CPAP on and O2 sats dropped dangerously low, again. Session with emphasis on functional mobility/transfers, generalized strengthening and endurance, ambulation, and toileting. Pt on 5L O2 via [place] with O2 sats 95% at rest but dropping as low as 80% with mobility. Pt transferred semi-reclined<>sitting EOB with HOB elevated and heavy reliance on bedrails with close supervision and increased time with great effort. Pt initially reaching out for therapist's hand but with encouragement was able to perform without assist. Pt required extensive seated rest break prior to standing up, then transferred sit<>stand with RW and mod A and pt ambulated 41ft x 2 trials with RW and min A to/from bathroom (heavy min A to navigate threshold in bathroom) - stabilizing LUE on RW grip to prevent it from slipping off. Pt very pleased that she was able to walk to the bathroom this morning (as it's been  a few days since  she's been able to). O2 sats dropped to 85% but quickly increased to 92% resting on regular toilet. Pt continent of bladder and performed peri-care with supervision - again required extensive rest break on commode. Pt then performed the following seated exercises with supervision and verbal cues for technique with emphasis on LE strength/ROM: -LAQ 2x12 bilaterally  -hip flexion 2x12 bilaterally -heel/toe raises 2x10 bilaterally -ankle inversion/eversion "windshield wipers" AAROM on towel 2x10 bilaterally - to decrease "tightness" in ankles Concluded session with pt sitting in [facility] with all needs within reach awaiting next PT session.   Therapy Documentation Precautions:  Precautions Precautions: Fall Precaution Comments: monitor O2 Restrictions Weight Bearing Restrictions: No  Therapy/Group: Individual Therapy Alfonse Alpers PT, DPT  04/05/2021, 7:17 AM

## 2021-04-05 NOTE — Progress Notes (Signed)
Occupational Therapy Session Note  Patient Details  Name: Nicole Schmidt MRN: [id number] Date of Birth: 10/12/1965  Today's Date: 04/05/2021 OT Individual Time: 1000-1057 OT Individual Time Calculation (min): 57 min    Short Term Goals: Week 1:  OT Short Term Goal 1 (Week 1): STG = LTG 2/2 ELOS  Skilled Therapeutic Interventions/Progress Updates:  Skilled OT intervention completed with focus on self-care, O2 weaning and functional sit > stands. Pt received seated in w/c, agreeable to session. Pt received with continuous pulse-ox on 80% sat while on 5 L. Encouraged pt to complete pursed lip breathing, with pt able to rebound to >90% sat. Pt requesting to wash hair, with therapist completing at total A with use of shower tray. Pt able to participate in hair drying and brushing for functional BUE endurance. Note- continuous pulse-ox with readings of 80% or below with HR below 30, even with breathing technique and bumping O2 to 5.5 L but pt asymptomatic and poor reading. Therapist used dynamap to double check with readings of 100% O2 sat and HR of 80. Notified pt's nurse of inaccurate measures, with caution to not bump pt up on O2 L as she could have more O2 than needed due to inaccurate readings as well as maybe getting new cord for more accurate readings. Encouraged pt to stand using RW, with pt able to complete at min A, and able to weight shift L<>R alternating x10. Upon rest, pt reporting extreme fatigue just after one stand. Education provided to pt on her CLOF, with pt asking about extension of therapy with further education provided about pt's participation and typical causes of extending however discussed that this therapist will communicate to care team about that in meeting tomorrow. DME education provided, with pt determining she has TTB and BBSC available at home, and set up tips provided. Pt on [place], on 5 L, with 100% sat at end of session. Pt left seated in w/c, with chair alarm on and all needs  in reach.  Therapy Documentation Precautions:  Precautions Precautions: Fall Precaution Comments: monitor O2 Restrictions Weight Bearing Restrictions: No   Pain: Unrated pain in R ankle upon standing, nurse aware, therapist encouraged weight shifting for off loading comfort   Therapy/Group: Individual Therapy  [name] E Rory Xiang 04/05/2021, 7:36 AM

## 2021-04-05 NOTE — Progress Notes (Signed)
Occupational Therapy Weekly Progress Note  Patient Details  Name: Nicole Schmidt MRN: [id number] Date of Birth: December 21, 1965  Beginning of progress report period: March 29, 2021 End of progress report period: April 05, 2021   Pt is making slow progress towards long term goals. STG were not created initially but this therapist created new STGs to work towards meeting pt's LTGs. Pt continues to be limited by R ankle pain, decreased oxygen support with pt requiring increased encouragement to participate in therapy tasks. Pt has however progressed to completing functional sit > stands and short ambulatory transfers in prep for ADL from mod A to CGA while using RW, progressed from max A LB self-care to min-mod A. Pt would benefit from continued practice with toilet/shower transfers and home management safety education, as well as to promote increased endurance for functional tasks.  Patient continues to demonstrate the following deficits: muscle weakness and muscle joint tightness, decreased cardiorespiratoy endurance and decreased oxygen support, decreased coordination and decreased motor planning, and decreased standing balance and decreased balance strategies and therefore will continue to benefit from skilled OT intervention to enhance overall performance with BADL.  Patient progressing toward long term goals..  Continue plan of care.  OT Short Term Goals Week 1:  OT Short Term Goal 1 (Week 1): STG = LTG 2/2 ELOS Week 2:  OT Short Term Goal 1 (Week 2): pt will complete ambulatory toilet transfer with supervision with LRAD to toilet in bathroom OT Short Term Goal 2 (Week 2): Pt will complete LB dressing with AE as needed with no more than min A OT Short Term Goal 3 (Week 2): Pt will complete sit > stand with RW, in prep for ADL with supervision    Kabrina Christiano E Macarius Ruark 04/05/2021, 4:03 PM

## 2021-04-05 NOTE — Progress Notes (Signed)
PROGRESS NOTE   Subjective/Complaints: No new complaints this morning Feels the voltaren gel doesn't hurt- does not think she is getting it 4 times per day- order reviewed and it is currently scheduled 4 times per day  ROS: +lower extremity edema, denies pain, SOB, +sensation of water in ear, +right ankle pain- chronic, improved. +hypoxia  Objective:   No results found. No results for input(s): WBC, HGB, HCT, PLT in the last 72 hours.  No results for input(s): NA, K, CL, CO2, GLUCOSE, BUN, CREATININE, CALCIUM in the last 72 hours.   Intake/Output Summary (Last 24 hours) at 04/05/2021 1309 Last data filed at 04/05/2021 0700 Gross per 24 hour  Intake 236 ml  Output --  Net 236 ml        Physical Exam: Vital Signs Blood pressure 131/84, pulse 66, temperature 97.9 F (36.6 C), temperature source Oral, resp. rate 17, height _0  (1.626 m), weight (!) 156.5 kg, last menstrual period 07/17/2012, SpO2 99 %.  General: No acute distress, BMI 59.22 Mood and affect are appropriate Heart: Regular rate and rhythm no rubs murmurs or extra sounds Lungs: Clear to auscultation, breathing unlabored, no rales or wheezes Abdomen: Positive bowel sounds, soft nontender to palpation, nondistended Extremities: No clubbing, cyanosis, or edema  Skin: Stasis dermatitis bilateral pre tibial  Neurologic: Cranial nerves II through XII intact, motor strength is 5/5 in bilateral deltoid, bicep, tricep, grip, 3-/ 5 hip flexor, knee extensors, ankle dorsiflexor and plantar flexor Sensory exam normal sensation to light touch and proprioception in bilateral upper and lower extremities. + allodynia of lower extremities Cerebellar exam normal finger to nose to finger as well as heel to shin in bilateral upper and lower extremities Musculoskeletal: Full range of motion in all 4 extremities. +right ankle swelling   Assessment/Plan: 1. Functional deficits  which require 3+ hours per day of interdisciplinary therapy in a comprehensive inpatient rehab setting. Physiatrist is providing close team supervision and 24 hour management of active medical problems listed below. Physiatrist and rehab team continue to assess barriers to discharge/monitor patient progress toward functional and medical goals  Care Tool:  Bathing    Body parts bathed by patient: Right arm, Left arm, Chest, Abdomen, Front perineal area, Right upper leg, Left upper leg, Face (Patient was able to simulate bathing UB, but was met with challenges for LB funciton for lower legs and feet.)   Body parts bathed by helper: Right lower leg, Left lower leg, Buttocks (Areas the pt was unable to address)     Bathing assist Assist Level: Maximal Assistance - Patient 24 - 49% (per OT evaluation note) Assistive Device Comment: Long handled sponge, seated bathing (patient would benefit from long handle sponge and is reported as having a raise toilet already in place.)   Upper Body Dressing/Undressing Upper body dressing   What is the patient wearing?: Pull over shirt    Upper body assist Assist Level: Minimal Assistance - Patient > 75% (secondary to O2 canal placement.)    Lower Body Dressing/Undressing Lower body dressing      What is the patient wearing?: Pants, Underwear/pull up     Lower body assist Assist for lower body  dressing: Maximal Assistance - Patient 25 - 49% (Secondary to fatigue and challenges with active range) Assistive Device Comment: with reacher (Patient would benefit from additional practice)   Toileting Toileting Toileting Activity did not occur (Clothing management and hygiene only): Refused  Toileting assist Assist for toileting: Minimal Assistance - Patient > 75%     Transfers Chair/bed transfer  Transfers assist  Chair/bed transfer activity did not occur: Refused (additional assessing needed in this area)  Chair/bed transfer assist level: Minimal  Assistance - Patient > 75% Chair/bed transfer assistive device: Walker, Clinical biochemist   Ambulation assist   Ambulation activity did not occur: Refused (additional assessing needed in this area)  Assist level: Contact Guard/Touching assist Assistive device: Walker-rolling Max distance: 55f   Walk 10 feet activity   Assist  Walk 10 feet activity did not occur: Safety/medical concerns (weakness, fatigue, deconditioning)  Assist level: Contact Guard/Touching assist Assistive device: Walker-rolling   Walk 50 feet activity   Assist Walk 50 feet with 2 turns activity did not occur: Safety/medical concerns (weakness, fatigue, deconditioning)         Walk 150 feet activity   Assist Walk 150 feet activity did not occur: Safety/medical concerns (weakness, fatigue, deconditioning)         Walk 10 feet on uneven surface  activity   Assist Walk 10 feet on uneven surfaces activity did not occur: Safety/medical concerns (weakness, fatigue, deconditioning)         Wheelchair     Assist Is the patient using a wheelchair?: Yes Type of Wheelchair: Manual Wheelchair activity did not occur: Safety/medical concerns (weakness, fatigue, deconditioning)         Wheelchair 50 feet with 2 turns activity    Assist    Wheelchair 50 feet with 2 turns activity did not occur: Safety/medical concerns (weakness, fatigue, deconditioning)       Wheelchair 150 feet activity     Assist  Wheelchair 150 feet activity did not occur: Safety/medical concerns (weakness, fatigue, deconditioning)       Blood pressure 131/84, pulse 66, temperature 97.9 F (36.6 C), temperature source Oral, resp. rate 17, height _0  (1.626 m), weight (!) 156.5 kg, last menstrual period 07/17/2012, SpO2 99 %.    Medical Problem List and Plan: 1. Functional deficits secondary to debility/acute on chronic hypercapnia respiratory failure.  Oxygen therapy as  directed/BiPAP             -patient may  shower             -ELOS/Goals: 16 days Mod I goals for mobility and self care   Discussed d/c date with her  Continue CIR 2.  Antithrombotics: -DVT/anticoagulation:  Pharmaceutical: Other (comment) Eliquis             -antiplatelet therapy: N/A 3. Pain from pressure injury: improved, Neurontin 300 mg nightly, decrease tramadol to q12H prn for moderate pain 4. Mood: Melatonin as needed             -antipsychotic agents: N/A 5. Neuropsych: This patient is capable of making decisions on her own behalf. 6. Skin/Wound Care: Routine skin checks 7. Fluids/Electrolytes/Nutrition: Routine in and outs with follow-up chemistries 8.  End-stage renal disease.  Continue hemodialysis as per renal services.  Permacath placement 02/25/2021..Marland KitchenDiscussed her recent creatinine levels with her 9.  Hypotension   Norvasc 5 mg daily, decrease Toprol XL to 12.5 mg daily.  Monitor with increased mobility. Decrease Tramadol to q8H prn.  10.  History of atrial fibrillation/pulmonary emboli/acute on chronic CHF.  Continue Eliquis.  Cardiac rate controlled. continue beta-blocker.Monitor for fluid overload Vitals:   04/05/21 0750 04/05/21 1210  BP:  131/84  Pulse: 87 66  Resp: 16 17  Temp:  97.9 F (36.6 C)  SpO2: 94% 99%    11.  Hypothyroidism.  Synthroid 12.  Hyperlipidemia.  Lipitor 13.  History of chronic anemia. 14.  Diabetes mellitus.  Latest hemoglobin A1c 7.2.  SSI. Placed nursing order for no fluids other than water.  15.  History of hematuria.  Left ureteral stone with recent ureteroscopy/laser lithotripsy.  Repeat ultrasound showed resolution of left hydronephrosis.  Follow-up Dr.Sninsky. 16.  Morbid obesity.  BMI 62.74-->60.55. Provided dietary education.  17.  Acute blood loss anemia/leukocytosis.  Follow-up chemistries 18.  Urine culture positive ESBL/Proteus/E. coli.  Treat empirically with  one dose fosfomycin 2/9.  Contact precautions. Discussed with her  that she will need these until day of discharge.  19.Leukocytosis.  Resolved, continue to monitor w/ HD. Follow-up per infectious disease Dr.Ravisankar and presently maintained on IV Invanz x 4 does. 20. Sensation of water in left ear: debrox and antibiotic drops ordered, continue 21. Right ankle pain related to osteoarthritis, will start Voltaren gel has as needed oxycodone for more severe pain, placed nursing order to ice 15 minutes three times per day, messaged nurse about this  LOS: 8 days A FACE TO FACE EVALUATION WAS [place] 04/05/2021, 1:09 PM

## 2021-04-05 NOTE — Progress Notes (Signed)
[facility] Progress Note    Assessment/ Plan:   Assessment/Plan:  Debility - following prolonged hospitalizations.  In CIR.   Chronic respiratory failure/hypercapnic respiratory failure - on 2L O2 prior to last admission, uses bipap overnight.  Acute resp failure - due to vol overload/ pulm edema, had extra HD 2/19 pm w/ 3L off. Improved. Cont to lower vol as below  ESRD -  on HD TTS. HD today.   Hypertension - Blood pressure soft yest > dc'd norvasc. OK to cont toprol xl 12.5 qd for afib. But low BP's limit UF. We added midodrine 10 mg pre HD TTS.  Volume overload - persistent problem since starting HD, had discussion yest about lowering fluid intake.    Anemia of CKD - Hgb 8.2- 2/18. Aranesp 179mcg qwk. Fe load ordered to start 2/21  Secondary Hyperparathyroidism -  Calcium and phos in goal.  Continue VDRA.  renal diet, on revela. Cal trending up, consider holding os-cal  Recent ESBL UTI/Hematuria/L ureteral stone sp stent placement on 02/14/21 -  Repeat US with resolution for hydronephrosis.  F/u urology at OP. On ertapenem until 2/16 Nutrition - Renal diet w/fluid restrictions.  Hx A fib - on eliquis, toprol xl 12.5 qd. DMT2 - per PMD Access: RIJ TDC. Patient wishes to have her AVF/AVG done as an outpatient/post discharge which is reasonable Dispo: EDD 3/1  Kelly Splinter, MD 04/05/2021, 2:42 PM    Outpatient Dialysis Orders:  [facility] TTS [address] RIJ permcath  10000 unit EPO qHD Calcitriol 0.25mg  qd  Subjective:   Seen in room, no c/o's. today   Objective:   BP (!) 131/56    Pulse 79    Temp 97.7 F (36.5 C) (Oral)    Resp 17    Ht 5\' 4"  (1.626 m)    Wt (!) 157.8 kg    LMP 07/17/2012    SpO2 100%    BMI 59.71 kg/m   Intake/Output Summary (Last 24 hours) at 04/05/2021 1442 Last data filed at 04/05/2021 0700 Gross per 24 hour  Intake 236 ml  Output --  Net 236 ml    Weight change:   Physical Exam: Gen: alert CVS:rrr, s1s2 Resp: poor air exchange  b/l, on bipap LGX:QJJHE, soft Ext: diffuse 1-2+  pitting edema b/l Les Neuro: alert, nonfocal Dialysis access: RIJ TDC c/d/i  Imaging: No results found.  Labs: BMET Recent Labs  Lab 03/31/21 1259 04/02/21 1257  NA 129* 128*  K 4.2 5.0  CL 93* 93*  CO2 27 27  GLUCOSE 117* 133*  BUN 45* 40*  CREATININE 5.36* 4.92*  CALCIUM 10.6* 10.0  PHOS 5.9* 8.0*    CBC Recent Labs  Lab 04/02/21 1257 04/05/21 1355  WBC 10.1 14.3*  HGB 8.2* 8.1*  HCT 29.9* 29.1*  MCV 100.3* 97.7  PLT 287 318     Medications:     apixaban  5 mg Oral BID   atorvastatin  20 mg Oral QHS   bisacodyl  10 mg Rectal Daily   calcitRIOL  0.25 mcg Oral Daily   calcium carbonate  1,250 mg Oral Q breakfast   carbamide peroxide  5 drop Left EAR BID   chlorhexidine  15 mL Mouth Rinse BID   Chlorhexidine Gluconate Cloth  6 each Topical Q0600   [START ON 04/07/2021] darbepoetin (ARANESP) injection - DIALYSIS  100 mcg Intravenous Q Thu-HD   diclofenac Sodium  2 g Topical QID   famotidine  10 mg Oral Daily  feeding supplement (NEPRO CARB STEADY)  237 mL Oral BID BM   folic acid  4,481 mcg Oral Daily   gabapentin  300 mg Oral QHS   Gerhardt's butt cream  3 application Topical TID   heparin sodium (porcine)       insulin aspart  0-15 Units Subcutaneous TID WC   levothyroxine  112 mcg Oral QAC breakfast   mouth rinse  15 mL Mouth Rinse q12n4p   metoprolol succinate  12.5 mg Oral Daily   midodrine  10 mg Oral Q T,Th,Sa-HD   multivitamin  1 tablet Oral QHS   neomycin-polymyxin-hydrocortisone  3 drop Left EAR Q8H   sevelamer carbonate  800 mg Oral TID WC   Vitamin D (Ergocalciferol)  50,000 Units Oral Weekly

## 2021-04-05 NOTE — Progress Notes (Signed)
Physical Therapy Session Note  Patient Details  Name: Nicole Schmidt MRN: [id number] Date of Birth: Feb 07, 1966  Today's Date: 04/05/2021 PT Individual Time: 1131-1158 PT Individual Time Calculation (min): 27 min   Short Term Goals: Week 2:  PT Short Term Goal 1 (Week 2): pt will perform transfers with LRAD and CGA consistantly PT Short Term Goal 2 (Week 2): pt will ambulate 52f with LRAD and supervision PT Short Term Goal 3 (Week 2): pt will perform simulated car transfer with LRAD and min A  Skilled Therapeutic Interventions/Progress Updates:   Pt seen sitting in recliner to start session. Pt agreeable to PT tx. Reports 5/10 R ankle pain. Rest breaks, distraction, and emotional support provided for pain management. NT entering room for blood sugar prior to lunch. MD also entering room for rounding, discussed Volatern gel and icing for R ankle. Pt on 5L O2 during session, resting 97% - on portable 5L oxygen for remainder of session.   Discussed importance of shoe wear to assist with balance and supporting her R ankle. Donned her croc's but had to remove hospital socks in order to fit. Sit<>stand to RW with minA from w/c height - ambulated ~25-342f (x1 standing rest break) with CGA and RW with +2 assist for w/c follow for safety. Primary gait deficits include step-to gait, forward flexed trunk, decreased gait speed, and slightly antalgic due to R ankle discomfort. Pt subjectively reports improvement in R ankle with weight bearing through her Croc's compared to hospital socks.   Transported back to room for time. Pt requesting to toilet - NT called for assistance due to time constraints. Direct handoff of care with pt sitting in recliner. All needs met.   Therapy Documentation Precautions:  Precautions Precautions: Fall Precaution Comments: monitor O2 Restrictions Weight Bearing Restrictions: No RLE Weight Bearing: Partial weight bearing LLE Weight Bearing: Partial weight  bearing General:    Therapy/Group: Individual Therapy  [name]/21/2023, 7:56 AM

## 2021-04-06 LAB — GLUCOSE, CAPILLARY
Glucose-Capillary: 91 mg/dL (ref 70–99)
Glucose-Capillary: 119 mg/dL — ABNORMAL HIGH (ref 70–99)
Glucose-Capillary: 105 mg/dL — ABNORMAL HIGH (ref 70–99)
Glucose-Capillary: 121 mg/dL — ABNORMAL HIGH (ref 70–99)

## 2021-04-06 LAB — CBC WITH DIFFERENTIAL/PLATELET
Abs Immature Granulocytes: 0.06 10*3/uL (ref 0.00–0.07)
Basophils Absolute: 0.1 10*3/uL (ref 0.0–0.1)
Basophils Relative: 1 %
Eosinophils Absolute: 0.6 10*3/uL — ABNORMAL HIGH (ref 0.0–0.5)
Eosinophils Relative: 6 %
HCT: 30 % — ABNORMAL LOW (ref 36.0–46.0)
Hemoglobin: 8.7 g/dL — ABNORMAL LOW (ref 12.0–15.0)
Immature Granulocytes: 1 %
Lymphocytes Relative: 12 %
Lymphs Abs: 1.3 10*3/uL (ref 0.7–4.0)
MCH: 27.7 pg (ref 26.0–34.0)
MCHC: 29 g/dL — ABNORMAL LOW (ref 30.0–36.0)
MCV: 95.5 fL (ref 80.0–100.0)
Monocytes Absolute: 1.2 10*3/uL — ABNORMAL HIGH (ref 0.1–1.0)
Monocytes Relative: 11 %
Neutro Abs: 7.9 10*3/uL — ABNORMAL HIGH (ref 1.7–7.7)
Neutrophils Relative %: 69 %
Platelets: 324 10*3/uL (ref 150–400)
RBC: 3.14 MIL/uL — ABNORMAL LOW (ref 3.87–5.11)
RDW: 17 % — ABNORMAL HIGH (ref 11.5–15.5)
WBC: 11.1 10*3/uL — ABNORMAL HIGH (ref 4.0–10.5)
nRBC: 0.5 % — ABNORMAL HIGH (ref 0.0–0.2)

## 2021-04-06 LAB — URINALYSIS, ROUTINE W REFLEX MICROSCOPIC
Glucose, UA: NEGATIVE mg/dL
Ketones, ur: NEGATIVE mg/dL
Nitrite: NEGATIVE
Protein, ur: 100 mg/dL — AB
Specific Gravity, Urine: 1.025 (ref 1.005–1.030)
pH: 5.5 (ref 5.0–8.0)

## 2021-04-06 LAB — URINALYSIS, MICROSCOPIC (REFLEX)

## 2021-04-06 NOTE — Patient Care Conference (Signed)
Inpatient RehabilitationTeam Conference and Plan of Care Update Date: 04/06/2021   Time: 11:40 AM    Patient Name: Nicole Schmidt      Medical Record Number: [id number]  Date of Birth: 05/05/1965 Sex: Female         Room/Bed: 6H60V/3X10G-26 Payor Info: Payor: [place] / Plan: BCBS COMM PPO / Product Type: *No Product type* /    Admit Date/Time:  03/28/2021  2:47 PM  Primary Diagnosis:  [place] [facility] Problems: Principal Problem:   Debility    Expected Discharge Date: Expected Discharge Date: 04/13/21  Team Members Present: Physician leading conference: Dr. Leeroy Cha Social Worker Present: Erlene Quan, BSW Nurse Present: Dorien Chihuahua, RN PT Present: Becky Sax, PT OT Present: Other (comment) [facility] Alphonsa Gin, OT) SLP Present: Charolett Bumpers, SLP PPS Coordinator present : Gunnar Fusi, SLP     Current Status/Progress Goal Weekly Team Focus  Bowel/Bladder     HD; anuric Continent of bowel with bedpan   Continent of bowel; HD   Toileting  Swallow/Nutrition/ Hydration             ADL's   supervision UB self care, mod A LB self-care, min A stand pivot/ambulatory toilet transfers with RW  mod I  functional endurance, breathing techniques, activity participation, AE education   Mobility   bed mobility mod A, transfers with RW mod A, gait 15ft with RW CGA - pt seems to be having recent set back as of 2/17 with increased R ankle pain, difficulty standing, and now requires 5L O2  mod I  functional mobility/transfers, generalized strengthening and endurance, dynamic standing balance, gait training, stair traning, D/C planning   Communication             Safety/Cognition/ Behavioral Observations            Pain     Right ankle pain   Pain managed with prn meds   Monitor need for and effectiveness of meds  Skin     LE edema, MASD to buttocks   MASD healing   Skin assessment q shift, reposition patient and keep skin dry    Discharge Planning:   Discharging home with PRN assistance from family. Family in discussion of ALF (cost?)   Team Discussion: Patient with bil LE edema; MD adjusting meds. Reinforce wearing CPAP at HS; able to maintain sats with O2 at 2L/min [place].  Patient on target to meet rehab goals: yes, currently needs min assist tfor stand pivot transfers and to ambulate up to 47'. CGA- supervision overall. Needs supervision for upper body care and mod assist for lower body care. Goals for discharge set for mod I overall.  *See Care Plan and progress notes for long and short-term goals.   Revisions to Treatment Plan:  Stair trials   Teaching Needs: Safety, transfers, toileting, medications, skin care, etc  Current Barriers to Discharge: Decreased caregiver support and Home enviroment access/layout  Possible Resolutions to Barriers: Family education with cousin [place] follow up services recommended DME: has TTB, and BSC Recommend ramp for entry to home  SNF recommended due to home environment and lack of help at home PTAR transport to home     Medical Summary Current Status: right ankle pain, bilateral lower extremity swelling, ESRD, hypoxia, falling asleep without CPAP mask  Barriers to Discharge: Medical stability;Weight;Hemodialysis  Barriers to Discharge Comments: right ankle pain, bilateral lower extremity swelling, ESRD, hypoxia, falling asleep without CPAP mask Possible Resolutions to [employer] Focus: right ankle XR  results reviewed with patient, continue HD, decrease blood presusre medications, educated regarding the importance of using the CPAP machine   Continued Need for Acute Rehabilitation Level of Care: The patient requires daily medical management by a physician with specialized training in physical medicine and rehabilitation for the following reasons: Direction of a multidisciplinary physical rehabilitation program to maximize functional independence : Yes Medical management of patient stability  for increased activity during participation in an intensive rehabilitation regime.: Yes Analysis of laboratory values and/or radiology reports with any subsequent need for medication adjustment and/or medical intervention. : Yes   I attest that I was present, lead the team conference, and concur with the assessment and plan of the team.   Margarito Liner 04/06/2021, 4:48 PM

## 2021-04-06 NOTE — Progress Notes (Signed)
Physical Therapy Session Note  Patient Details  Name: Nicole Schmidt MRN: [id number] Date of Birth: 01/26/1966  Today's Date: 04/06/2021 PT Individual Time: 1100-1200 PT Individual Time Calculation (min): 60 min   Short Term Goals: Week 2:  PT Short Term Goal 1 (Week 2): pt will perform transfers with LRAD and CGA consistantly PT Short Term Goal 2 (Week 2): pt will ambulate 78ft with LRAD and supervision PT Short Term Goal 3 (Week 2): pt will perform simulated car transfer with LRAD and min A  Skilled Therapeutic Interventions/Progress Updates:     Patient in recliner in the room on 2L/min O2 upon PT arrival. Patient alert and agreeable to PT session. Patient denied pain during session, reports her ankle pain has been much improved today with improved ROM.  Discussed home set-up and patient reports 3 steps with B rails to enter and access to a ramp that may be placed prior to discharge. Looked at the ramp in the therapy gym and discussed stresses on her ankle with ascending the ramp, patient willing to attempt, but ramp unavailable due to maintenance. Focused remainder of session on stair, gait, and w/c training.   Patient maintained SPO2 >90% on 2L/min throughout session, bumped patient up to 3L/min with stair training to maintain saturations.  Therapeutic Activity: Transfers: Patient performed sit to/from stand x3 with CGA using RW. Provided verbal cues for reaching back to sit x1.  Gait Training:  Patient ambulated 10 feet and 24 feet using RW with CGA. Ambulated with decreased gait speed, decreased step length and height, decreased stance time on R, lateral hip instability, forward trunk lean, and downward head gaze. Provided verbal cues for erect posture, safe proximity to RW, and looking ahead. Patient ascended/descended 8x3" steps using B rails with CGA. Performed step-to gait pattern leading with L while ascending and R while descending. Provided cues for technique and sequencing.    Wheelchair Mobility:  Patient propelled wheelchair 43 feet with min A for forward momentum due to reduced upper extremity strength and body habitus. Provided verbal cues for increased stroke length and turning technique.  Patient in recliner in the room at end of session with breaks locked and all needs within reach.   Therapy Documentation Precautions:  Precautions Precautions: Fall Precaution Comments: monitor O2 Restrictions Weight Bearing Restrictions: No RLE Weight Bearing: Partial weight bearing LLE Weight Bearing: Partial weight bearing    Therapy/Group: Individual Therapy  Sanyla Summey L Laren Orama PT, DPT  04/06/2021, 1:00 PM

## 2021-04-06 NOTE — Progress Notes (Signed)
[place] and spoke to [employer]. Clinic advised of pt's target d/c date of 3/1 and that pt will resume on 3/2. Will confirm with clinic next week.   Melven Sartorius Renal Navigator [phone]

## 2021-04-06 NOTE — Progress Notes (Signed)
Occupational Therapy Session Note  Patient Details  Name: Nicole Schmidt MRN: [id number] Date of Birth: [birth year]  Today's Date: 04/06/2021 OT Individual Time: 1330-1430 OT Individual Time Calculation (min): 60 min    Short Term Goals: Week 1:  OT Short Term Goal 1 (Week 1): STG = LTG 2/2 ELOS  Skilled Therapeutic Interventions/Progress Updates:  Skilled OT intervention completed with focus on LB dressing, AE education, ambulatory endurance. Pt received seated in recliner, having just finished PT session with pt agreeable to OT, but c/o fatigue. Pt requesting to complete in room tasks at start of session. Education provided to pt about use of reacher for LB dressing, where to purchase. Pt able to utilize reacher during donning of pants at the supervision level, including sit > stand with RW and use of reacher to pick up fallen pants at ankles. Pt donned shirt with min A due to management of continuous pulse-ox. While seated in recliner, pt completed the following exercises to promote functional endurance:  Plantar/dorsi flexion with red theraband resistance, x15 each way Horizontal abduction x15 Bicep flexion x15 each arm Diagonal pulls x10 each arm  Pt required cues for form and technique. Pt left seated in recliner, with BLE elevated, with chair alarm on and all needs in reach at end of session.  O2 sat during session: At start of session, on 2L, 93% While threading of pants, 2 L, 90% After standing during donning over hips, 2 L, 85% but rebounded to 92% after rest During UE exercises- 88-92% At end of session 91%   Therapy Documentation Precautions:  Precautions Precautions: Fall Precaution Comments: monitor O2 Restrictions Weight Bearing Restrictions: No   Pain: Unrated pain in R ankle, more just discomfort, declined intervention   Therapy/Group: Individual Therapy  Dylan Monforte E Raynelle Fujikawa 04/06/2021, 7:45 AM

## 2021-04-06 NOTE — Progress Notes (Signed)
Physical Therapy Session Note  Patient Details  Name: Nicole Schmidt MRN: [id number] Date of Birth: [birth year]  Today's Date: 04/06/2021 PT Individual Time: [phone] and [phone] PT Individual Time Calculation (min): 54 min and 24 min  Short Term Goals: Week 1:  PT Short Term Goal 1 (Week 1): pt will perform transfer with LRAD and CGA PT Short Term Goal 1 - Progress (Week 1): Progressing toward goal PT Short Term Goal 2 (Week 1): pt will ambulate 43f with LRAD and CGA PT Short Term Goal 2 - Progress (Week 1): Met PT Short Term Goal 3 (Week 1): pt will initate stair training PT Short Term Goal 3 - Progress (Week 1): Met Week 2:  PT Short Term Goal 1 (Week 2): pt will perform transfers with LRAD and CGA consistantly PT Short Term Goal 2 (Week 2): pt will ambulate 231fwith LRAD and supervision PT Short Term Goal 3 (Week 2): pt will perform simulated car transfer with LRAD and min A  Skilled Therapeutic Interventions/Progress Updates:   Treatment Session 1 Received pt semi-reclined in bed, pt agreeable to PT treatment, and reported pain in R ankle is much better today! Session with emphasis on functional mobility/transfers, generalized strengthening and endurance, dynamic standing balance/coordination, toileting, and gait training. MD arrived for morning rounds and discussed plan for scheduling time to don/doff CPAP to prevent pt from falling asleep and O2 sats dropping. Semi-reclined<>sitting EOB with HOB elevated and heavy use of bedrails with supervision and increased effort and donned crocs with max A. Sit<>stand with RW and min A and ambulated 1066fith RW and CGA into bathroom. Pt continent of bladder and stood from regular toilet with RW/grab bars and min A and ambulated 53f8fth RW and CGA/close supervision with total A to manage O2 tank and WC with 1 standing rest break. Pt took extensive seated rest break then ambulated additional 47ft41f with RW and CGA/close supervision with 1  standing rest break. Took extended seated rest break EOB and ambulated 15ft 21f RW and close supervision to recliner. Concluded session with pt sitting in recliner, needs within reach, and feet elevated. Pt left on 2L O2 via [place] with O2 sats 92%!  O2 saturations: -Received pt on 5L; sats 96% -Decreased to 4L with mobility; sats 90-92% <> further decreased to 3L and sats remained >92% with mobility <> further decreased to 2L at end of session with O2 sats 92% at rest.   Treatment Session 2 Received pt sitting in recliner. Pt agreeable to PT treatment and reported mild pain in R ankle (unrated). Pt on 2L O2 at rest with O2 sats 94-96% - remained on 2L throughout session with O2 sats >90%. Session with emphasis on functional mobility/transfers, generalized strengthening and endurance, and gait training. Donned crocs with max A and performed sit<>stands x2 with RW and CGA throughout session. Pt ambulated 27ft x66frials with RW and CGA/close supervision (with 1 standing rest break) - pt demonstrates wide BOS, decreased bilateral foot clearance, and decreased cadence. Returned to room and added up gait distances for today with a grand total of 208ft! C49fuded session with pt sitting in recliner with all needs within reach and laboratory present to draw blood. Pt left on 2L O2 with O2 sats 92%.   Therapy Documentation Precautions:  Precautions Precautions: Fall Precaution Comments: monitor O2 Restrictions Weight Bearing Restrictions: No RLE Weight Bearing: Partial weight bearing LLE Weight Bearing: Partial weight bearing  Therapy/Group: Individual Therapy Jeanene Mena M JDurwin Reges  Angelita Harnack PT, DPT   04/06/2021, 7:19 AM

## 2021-04-06 NOTE — Progress Notes (Signed)
Patient ID: Nicole Schmidt, female   DOB: 1965-08-26, 56 y.o.   MRN: [id number] Team Conference Report to Patient/Family  Team Conference discussion was reviewed with the patient and caregiver, including goals, any changes in plan of care and target discharge date.  Patient and caregiver express understanding and are in agreement.  The patient has a target discharge date of 04/13/21.  Sw met with patient and called pt cousin, Manuela Schwartz at bedside. Sw  provided team conference updates. Cousin concerned patient will not be ready before discharge, sw informed her if we anticipate then we will address. Pt and cousin requesting transportation resources. No additional questions or concerns  Dyanne Iha 04/06/2021, 2:13 PM

## 2021-04-06 NOTE — Progress Notes (Signed)
PROGRESS NOTE   Subjective/Complaints: She expresses no new complaints today Fell asleep without CPAP last night again- placed nursing order to please place CPAP at night even if she has fallen asleep  ROS: +lower extremity edema, denies pain, SOB, +sensation of water in ear, +right ankle pain- chronic, improved. +hypoxia, +daytime somnolence  Objective:   No results found. Recent Labs    04/05/21 1355  WBC 14.3*  HGB 8.1*  HCT 29.1*  PLT 318    Recent Labs    04/05/21 1339  NA 126*  K 5.1  CL 91*  CO2 26  GLUCOSE 117*  BUN 71*  CREATININE 6.26*  CALCIUM 10.0     Intake/Output Summary (Last 24 hours) at 04/06/2021 1315 Last data filed at 04/06/2021 0700 Gross per 24 hour  Intake 556 ml  Output 2000 ml  Net -1444 ml        Physical Exam: Vital Signs Blood pressure 128/60, pulse 88, temperature 98.1 F (36.7 C), temperature source Oral, resp. rate 18, height '5\' 4"'  (1.626 m), weight (!) (P) 155.8 kg, last menstrual period 07/17/2012, SpO2 95 %.  General: No acute distress, BMI 59.22 Mood and affect are appropriate Heart: Regular rate and rhythm no rubs murmurs or extra sounds Lungs: Clear to auscultation, breathing unlabored, no rales or wheezes Abdomen: Positive bowel sounds, soft nontender to palpation, nondistended Extremities: No clubbing, cyanosis, or edema  Skin: Stasis dermatitis bilateral pre tibial  Neurologic: Cranial nerves II through XII intact, motor strength is 5/5 in bilateral deltoid, bicep, tricep, grip, 3-/ 5 hip flexor, knee extensors, ankle dorsiflexor and plantar flexor Sensory exam normal sensation to light touch and proprioception in bilateral upper and lower extremities. + allodynia of lower extremities Cerebellar exam normal finger to nose to finger as well as heel to shin in bilateral upper and lower extremities Musculoskeletal: Full range of motion in all 4 extremities. +right  ankle swelling Difficulty with supine to sit   Assessment/Plan: 1. Functional deficits which require 3+ hours per day of interdisciplinary therapy in a comprehensive inpatient rehab setting. Physiatrist is providing close team supervision and 24 hour management of active medical problems listed below. Physiatrist and rehab team continue to assess barriers to discharge/monitor patient progress toward functional and medical goals  Care Tool:  Bathing    Body parts bathed by patient: Right arm, Left arm, Chest, Abdomen, Front perineal area, Right upper leg, Left upper leg, Face (Patient was able to simulate bathing UB, but was met with challenges for LB funciton for lower legs and feet.)   Body parts bathed by helper: Right lower leg, Left lower leg, Buttocks (Areas the pt was unable to address)     Bathing assist Assist Level: Maximal Assistance - Patient 24 - 49% (per OT evaluation note) Assistive Device Comment: Long handled sponge, seated bathing (patient would benefit from long handle sponge and is reported as having a raise toilet already in place.)   Upper Body Dressing/Undressing Upper body dressing   What is the patient wearing?: Pull over shirt    Upper body assist Assist Level: Minimal Assistance - Patient > 75% (secondary to O2 canal placement.)    Lower Body  Dressing/Undressing Lower body dressing      What is the patient wearing?: Pants, Underwear/pull up     Lower body assist Assist for lower body dressing: Maximal Assistance - Patient 25 - 49% (Secondary to fatigue and challenges with active range) Assistive Device Comment: with reacher (Patient would benefit from additional practice)   Toileting Toileting Toileting Activity did not occur (Clothing management and hygiene only): Refused  Toileting assist Assist for toileting: Minimal Assistance - Patient > 75%     Transfers Chair/bed transfer  Transfers assist  Chair/bed transfer activity did not occur:  Refused (additional assessing needed in this area)  Chair/bed transfer assist level: Contact Guard/Touching assist Chair/bed transfer assistive device: Walker, Armrests   Locomotion Ambulation   Ambulation assist   Ambulation activity did not occur: Refused (additional assessing needed in this area)  Assist level: Contact Guard/Touching assist Assistive device: Walker-rolling Max distance: 63f   Walk 10 feet activity   Assist  Walk 10 feet activity did not occur: Safety/medical concerns (weakness, fatigue, deconditioning)  Assist level: Contact Guard/Touching assist Assistive device: Walker-rolling   Walk 50 feet activity   Assist Walk 50 feet with 2 turns activity did not occur: Safety/medical concerns (weakness, fatigue, deconditioning)         Walk 150 feet activity   Assist Walk 150 feet activity did not occur: Safety/medical concerns (weakness, fatigue, deconditioning)         Walk 10 feet on uneven surface  activity   Assist Walk 10 feet on uneven surfaces activity did not occur: Safety/medical concerns (weakness, fatigue, deconditioning)         Wheelchair     Assist Is the patient using a wheelchair?: Yes Type of Wheelchair: Manual Wheelchair activity did not occur: Safety/medical concerns (weakness, fatigue, deconditioning)         Wheelchair 50 feet with 2 turns activity    Assist    Wheelchair 50 feet with 2 turns activity did not occur: Safety/medical concerns (weakness, fatigue, deconditioning)       Wheelchair 150 feet activity     Assist  Wheelchair 150 feet activity did not occur: Safety/medical concerns (weakness, fatigue, deconditioning)       Blood pressure 128/60, pulse 88, temperature 98.1 F (36.7 C), temperature source Oral, resp. rate 18, height '5\' 4"'  (1.626 m), weight (!) (P) 155.8 kg, last menstrual period 07/17/2012, SpO2 95 %.    Medical Problem List and Plan: 1. Functional deficits secondary to  debility/acute on chronic hypercapnia respiratory failure.  Oxygen therapy as directed/BiPAP             -patient may  shower             -ELOS/Goals: 16 days Mod I goals for mobility and self care   Discussed d/c date with her  Continue CIR  -Interdisciplinary Team Conference today   2.  Antithrombotics: -DVT/anticoagulation:  Pharmaceutical: Other (comment) Eliquis             -antiplatelet therapy: N/A 3. Pain from pressure injury: improved, Neurontin 300 mg nightly, decrease tramadol to q12H prn for moderate pain 4. Mood: Melatonin as needed             -antipsychotic agents: N/A 5. Neuropsych: This patient is capable of making decisions on her own behalf. 6. Skin/Wound Care: Routine skin checks 7. Fluids/Electrolytes/Nutrition: Routine in and outs with follow-up chemistries 8.  End-stage renal disease.  Continue hemodialysis as per renal services.  Permacath placement 02/25/2021..Marland KitchenDiscussed her recent  creatinine levels with her 9.  Hypotension   Norvasc 5 mg daily, decrease Toprol XL to 12.5 mg daily.  Monitor with increased mobility. Decrease Tramadol to q8H prn.  10.  History of atrial fibrillation/pulmonary emboli/acute on chronic CHF.  Continue Eliquis.  Cardiac rate controlled. continue beta-blocker.Monitor for fluid overload Vitals:   04/05/21 2355 04/06/21 0557  BP:  128/60  Pulse: 86 88  Resp:  18  Temp:  98.1 F (36.7 C)  SpO2: 95% 95%    11.  Hypothyroidism.  Synthroid 12.  Hyperlipidemia.  Lipitor 13.  History of chronic anemia. 14.  Diabetes mellitus.  Latest hemoglobin A1c 7.2.  SSI. Placed nursing order for no fluids other than water.  15.  History of hematuria.  Left ureteral stone with recent ureteroscopy/laser lithotripsy.  Repeat ultrasound showed resolution of left hydronephrosis.  Follow-up Dr.Sninsky. 16.  Morbid obesity.  BMI 62.74-->60.55. Provided dietary education.  17.  Acute blood loss anemia/leukocytosis.  Follow-up chemistries 18.  Urine culture  positive ESBL/Proteus/E. coli.  Treat empirically with  one dose fosfomycin 2/9.  Contact precautions. Discussed with her that she will need these until day of discharge.  19.Leukocytosis.  Elevated again, UA/UC ordered, repeat CBC ordered, continue to monitor w/ HD. Follow-up per infectious disease Dr.Ravisankar and presently maintained on IV Invanz x 4 does. 20. Sensation of water in left ear: debrox and antibiotic drops ordered, discontinue as has completed course.  21. Right ankle pain related to osteoarthritis, will start Voltaren gel has as needed oxycodone for more severe pain, placed nursing order to ice 15 minutes three times per day, messaged nurse about this  LOS: 9 days A FACE TO FACE EVALUATION WAS [place] 04/06/2021, 1:15 PM

## 2021-04-07 LAB — GLUCOSE, CAPILLARY
Glucose-Capillary: 96 mg/dL (ref 70–99)
Glucose-Capillary: 138 mg/dL — ABNORMAL HIGH (ref 70–99)
Glucose-Capillary: 115 mg/dL — ABNORMAL HIGH (ref 70–99)
Glucose-Capillary: 168 mg/dL — ABNORMAL HIGH (ref 70–99)

## 2021-04-07 LAB — RENAL FUNCTION PANEL
Albumin: 2.5 g/dL — ABNORMAL LOW (ref 3.5–5.0)
Anion gap: 10 (ref 5–15)
BUN: 56 mg/dL — ABNORMAL HIGH (ref 6–20)
CO2: 26 mmol/L (ref 22–32)
Calcium: 9.1 mg/dL (ref 8.9–10.3)
Chloride: 92 mmol/L — ABNORMAL LOW (ref 98–111)
Creatinine, Ser: 5.29 mg/dL — ABNORMAL HIGH (ref 0.44–1.00)
GFR, Estimated: 9 mL/min — ABNORMAL LOW (ref >60)
Glucose, Bld: 122 mg/dL — ABNORMAL HIGH (ref 70–99)
Phosphorus: 4.8 mg/dL — ABNORMAL HIGH (ref 2.5–4.6)
Potassium: 4.4 mmol/L (ref 3.5–5.1)
Sodium: 128 mmol/L — ABNORMAL LOW (ref 135–145)

## 2021-04-07 LAB — CBC
HCT: 26.3 % — ABNORMAL LOW (ref 36.0–46.0)
Hemoglobin: 7.9 g/dL — ABNORMAL LOW (ref 12.0–15.0)
MCH: 28.5 pg (ref 26.0–34.0)
MCHC: 30 g/dL (ref 30.0–36.0)
MCV: 94.9 fL (ref 80.0–100.0)
Platelets: 301 10*3/uL (ref 150–400)
RBC: 2.77 MIL/uL — ABNORMAL LOW (ref 3.87–5.11)
RDW: 16.9 % — ABNORMAL HIGH (ref 11.5–15.5)
WBC: 10.3 10*3/uL (ref 4.0–10.5)
nRBC: 0.3 % — ABNORMAL HIGH (ref 0.0–0.2)

## 2021-04-07 MED ORDER — HEPARIN SODIUM (PORCINE) 1000 UNIT/ML IJ SOLN
INTRAMUSCULAR | Status: AC
Start: 2021-04-07 — End: 2021-04-07
  Administered 2021-04-07: 2000 [IU] via INTRAVENOUS_CENTRAL
  Filled 2021-04-07: qty 5

## 2021-04-07 MED ORDER — CARBAMIDE PEROXIDE 6.5 % OT SOLN
5.0000 [drp] | Freq: Every day | OTIC | Status: DC
  Administered 2021-04-08 – 2021-04-14 (×7): 5 [drp] via OTIC
  Filled 2021-04-07: qty 15

## 2021-04-07 MED ORDER — HEPARIN SODIUM (PORCINE) 1000 UNIT/ML DIALYSIS
2000.0000 [IU] | INTRAMUSCULAR | Status: AC | PRN
  Administered 2021-04-07: 2000 [IU] via INTRAVENOUS_CENTRAL

## 2021-04-07 NOTE — Progress Notes (Signed)
PROGRESS NOTE   Subjective/Complaints: No new complaints today She slept well last night for the first time in a few nights. RT placed CPAP for her before she fell asleep She was woken at 6am this morning which she feels is too early for her  ROS: +lower extremity edema, denies pain, SOB, +sensation of water in ear, +right ankle pain- chronic, improved. +hypoxia, +daytime somnolence- improved today  Objective:   No results found. Recent Labs    04/05/21 1355 04/06/21 1324  WBC 14.3* 11.1*  HGB 8.1* 8.7*  HCT 29.1* 30.0*  PLT 318 324    Recent Labs    04/05/21 1339  NA 126*  K 5.1  CL 91*  CO2 26  GLUCOSE 117*  BUN 71*  CREATININE 6.26*  CALCIUM 10.0     Intake/Output Summary (Last 24 hours) at 04/07/2021 1209 Last data filed at 04/07/2021 0900 Gross per 24 hour  Intake 990 ml  Output --  Net 990 ml        Physical Exam: Vital Signs Blood pressure (!) 159/88, pulse 80, temperature 98 F (36.7 C), temperature source Oral, resp. rate 16, height '5\' 4"'  (1.626 m), weight (!) 161.4 kg, last menstrual period 07/17/2012, SpO2 100 %.  General: No acute distress, BMI 59.22 Mood and affect are appropriate Heart: Regular rate and rhythm no rubs murmurs or extra sounds Lungs: Clear to auscultation, breathing unlabored, no rales or wheezes Abdomen: Positive bowel sounds, soft nontender to palpation, nondistended Extremities: No clubbing, cyanosis, or edema  Skin: Stasis dermatitis bilateral pre tibial  Neurologic: Cranial nerves II through XII intact, motor strength is 5/5 in bilateral deltoid, bicep, tricep, grip, 3-/ 5 hip flexor, knee extensors, ankle dorsiflexor and plantar flexor Sensory exam normal sensation to light touch and proprioception in bilateral upper and lower extremities. + allodynia of lower extremities Cerebellar exam normal finger to nose to finger as well as heel to shin in bilateral upper and  lower extremities Musculoskeletal: Full range of motion in all 4 extremities. +right ankle swelling Difficulty with supine to sit Ambulating from bathroom to wheelchair in hallway S with RW   Assessment/Plan: 1. Functional deficits which require 3+ hours per day of interdisciplinary therapy in a comprehensive inpatient rehab setting. Physiatrist is providing close team supervision and 24 hour management of active medical problems listed below. Physiatrist and rehab team continue to assess barriers to discharge/monitor patient progress toward functional and medical goals  Care Tool:  Bathing    Body parts bathed by patient: Right arm, Left arm, Chest, Abdomen, Front perineal area, Right upper leg, Left upper leg, Face (Patient was able to simulate bathing UB, but was met with challenges for LB funciton for lower legs and feet.)   Body parts bathed by helper: Right lower leg, Left lower leg, Buttocks (Areas the pt was unable to address)     Bathing assist Assist Level: Maximal Assistance - Patient 24 - 49% (per OT evaluation note) Assistive Device Comment: Long handled sponge, seated bathing (patient would benefit from long handle sponge and is reported as having a raise toilet already in place.)   Upper Body Dressing/Undressing Upper body dressing   What is  the patient wearing?: Pull over shirt    Upper body assist Assist Level: Minimal Assistance - Patient > 75%    Lower Body Dressing/Undressing Lower body dressing      What is the patient wearing?: Pants     Lower body assist Assist for lower body dressing: Supervision/Verbal cueing Assistive Device Comment: with reacher   Toileting Toileting Toileting Activity did not occur (Clothing management and hygiene only): Refused  Toileting assist Assist for toileting: Minimal Assistance - Patient > 75%     Transfers Chair/bed transfer  Transfers assist  Chair/bed transfer activity did not occur: Refused (additional  assessing needed in this area)  Chair/bed transfer assist level: Contact Guard/Touching assist Chair/bed transfer assistive device: Programmer, multimedia   Ambulation assist   Ambulation activity did not occur: Refused (additional assessing needed in this area)  Assist level: Contact Guard/Touching assist Assistive device: Walker-rolling Max distance: 30f   Walk 10 feet activity   Assist  Walk 10 feet activity did not occur: Safety/medical concerns (weakness, fatigue, deconditioning)  Assist level: Contact Guard/Touching assist Assistive device: Walker-rolling   Walk 50 feet activity   Assist Walk 50 feet with 2 turns activity did not occur: Safety/medical concerns (weakness, fatigue, deconditioning)  Assist level: Contact Guard/Touching assist Assistive device: Walker-rolling    Walk 150 feet activity   Assist Walk 150 feet activity did not occur: Safety/medical concerns (weakness, fatigue, deconditioning)         Walk 10 feet on uneven surface  activity   Assist Walk 10 feet on uneven surfaces activity did not occur: Safety/medical concerns (weakness, fatigue, deconditioning)         Wheelchair     Assist Is the patient using a wheelchair?: Yes Type of Wheelchair: Manual Wheelchair activity did not occur: Safety/medical concerns (weakness, fatigue, deconditioning)         Wheelchair 50 feet with 2 turns activity    Assist    Wheelchair 50 feet with 2 turns activity did not occur: Safety/medical concerns (weakness, fatigue, deconditioning)       Wheelchair 150 feet activity     Assist  Wheelchair 150 feet activity did not occur: Safety/medical concerns (weakness, fatigue, deconditioning)       Blood pressure (!) 159/88, pulse 80, temperature 98 F (36.7 C), temperature source Oral, resp. rate 16, height '5\' 4"'  (1.626 m), weight (!) 161.4 kg, last menstrual period 07/17/2012, SpO2 100 %.    Medical Problem List and  Plan: 1. Functional deficits secondary to debility/acute on chronic hypercapnia respiratory failure.  Oxygen therapy as directed/BiPAP             -patient may  shower             -ELOS/Goals: 16 days Mod I goals for mobility and self care   Discussed d/c date with her  Continue CIR 2.  Antithrombotics: -DVT/anticoagulation:  Pharmaceutical: Other (comment) Eliquis             -antiplatelet therapy: N/A 3. Pain from pressure injury: improved, Neurontin 300 mg nightly, decrease tramadol to q12H prn for moderate pain 4. Mood: Melatonin as needed             -antipsychotic agents: N/A 5. Neuropsych: This patient is capable of making decisions on her own behalf. 6. Skin/Wound Care: Routine skin checks 7. Fluids/Electrolytes/Nutrition: Routine in and outs with follow-up chemistries 8.  End-stage renal disease.  Continue hemodialysis as per renal services.  Permacath placement 02/25/2021..Marland KitchenDiscussed her recent  creatinine levels with her 9.  Hypotension   Norvasc 5 mg daily, decrease Toprol XL to 12.5 mg daily.  Monitor with increased mobility. Decrease Tramadol to q8H prn.  10.  History of atrial fibrillation/pulmonary emboli/acute on chronic CHF.  Continue Eliquis.  Cardiac rate controlled. continue beta-blocker.Monitor for fluid overload Vitals:   04/06/21 2040 04/07/21 0541  BP: (!) 141/66 (!) 159/88  Pulse: 82 80  Resp: 18 16  Temp: 98.1 F (36.7 C) 98 F (36.7 C)  SpO2: 94% 100%    11.  Hypothyroidism.  Synthroid 12.  Hyperlipidemia.  Lipitor 13.  History of chronic anemia. 14.  Diabetes mellitus.  Latest hemoglobin A1c 7.2.  SSI. Placed nursing order for no fluids other than water.  15.  History of hematuria.  Left ureteral stone with recent ureteroscopy/laser lithotripsy.  Repeat ultrasound showed resolution of left hydronephrosis.  Follow-up Dr.Sninsky. 16.  Morbid obesity.  BMI 62.74-->60.55. Provided dietary education.  17.  Acute blood loss anemia/leukocytosis.  Follow-up  chemistries 18.  Urine culture positive ESBL/Proteus/E. coli.  Treat empirically with  one dose fosfomycin 2/9.  Contact precautions. Discussed with her that she will need these until day of discharge.  19.Leukocytosis.  Downtreding, discussed that UA is eqiuvocal, f/u UC. continue to monitor w/ HD. Follow-up per infectious disease Dr.Ravisankar and presently maintained on IV Invanz x 4 does. 20. Sensation of water in ears:  d/c antibiotic drop, continue debrox bilateral ears daily 21. Right ankle pain related to osteoarthritis, will start Voltaren gel has as needed oxycodone for more severe pain, placed nursing order to ice 15 minutes three times per day, messaged nurse about this  LOS: 10 days A FACE TO FACE EVALUATION WAS [place] 04/07/2021, 12:09 PM

## 2021-04-07 NOTE — Progress Notes (Signed)
This LPN administered pt's CHG bath

## 2021-04-07 NOTE — Progress Notes (Signed)
Physical Therapy Session Note  Patient Details  Name: Nicole Schmidt MRN: [id number] Date of Birth: [birth year]  Today's Date: 04/07/2021 PT Individual Time: 0900-0953 PT Individual Time Calculation (min): 53 min   Short Term Goals: Week 1:  PT Short Term Goal 1 (Week 1): pt will perform transfer with LRAD and CGA PT Short Term Goal 1 - Progress (Week 1): Progressing toward goal PT Short Term Goal 2 (Week 1): pt will ambulate 74f with LRAD and CGA PT Short Term Goal 2 - Progress (Week 1): Met PT Short Term Goal 3 (Week 1): pt will initate stair training PT Short Term Goal 3 - Progress (Week 1): Met Week 2:  PT Short Term Goal 1 (Week 2): pt will perform transfers with LRAD and CGA consistantly PT Short Term Goal 2 (Week 2): pt will ambulate 222fwith LRAD and supervision PT Short Term Goal 3 (Week 2): pt will perform simulated car transfer with LRAD and min A  Skilled Therapeutic Interventions/Progress Updates:   Received pt semi-reclined in bed, pt agreeable to PT treatment, and denied any pain during session. Session with emphasis on functional mobility/transfers, toileting, generalized strengthening and endurance, and gait training. Pt on 5L O2 via [place] at rest with O2 sats 100% - decreased to 2L for remainder of session and O2 sats remained >94% - RN notified at end of session. Pt transferred semi-reclined<>sitting EOB with HOB elevated and use of bedrails with supervision. Sit<>stand with RW and CGA and ambulated 1028fith RW and CGA into bathroom. Pt able to void with increased time and stood from regular height toilet with min A and ambulated additional [id number], 70ft13f (with 1 standing rest break), 92ft 81fwith 2 standing rest breaks), and 33ft x74fith RW and CGA/close supervision with total A to manage O2 tank and for WC follow. MD present for morning rounds and spoke with pt while taking rest break. Pt demonstrates improvements in endurance, demonstrated by ability to walk longer  distances and requiring shorter rest breaks. Concluded session with pt sitting in recliner with all needs within reach. Pt left on 2L O2 via [place] with O2 sats 94%.   Therapy Documentation Precautions:  Precautions Precautions: Fall Precaution Comments: monitor O2 Restrictions Weight Bearing Restrictions: No  Therapy/Group: Individual Therapy Lorain Fettes M Alfonse AlpersT   04/07/2021, 7:17 AM

## 2021-04-07 NOTE — Progress Notes (Signed)
Report given to HD nurse. Will send for transport around 1230

## 2021-04-07 NOTE — Progress Notes (Signed)
Occupational Therapy Session Note  Patient Details  Name: Nicole Schmidt MRN: [id number] Date of Birth: 31-Jul-1965  Today's Date: 04/07/2021 OT Individual Time: 1035-1150 OT Individual Time Calculation (min): 75 min    Short Term Goals: Week 1:  OT Short Term Goal 1 (Week 1): STG = LTG 2/2 ELOS  Skilled Therapeutic Interventions/Progress Updates:    Pt received in the recliner with no c/o pain, agreeable to OT session. She completed hair washing at the sink with OT providing assist with the tray and managing water to keep HD port dry. Following pt used a hair dryer bimanually, working on UE endurance/strengthening. She completed other grooming tasks at the sink with set up assist. Pt was taken to the therapy gym via the w/c. Pt completed standing level functional stepping, downgraded to just standing marching. Blocked practice 2x 5 sit <> stands from the w/c with CGA- (S) for increasing strengthening and endurance. Pt returned to her room and completed an ambulatory transfer back to the bed with close (S) with the RW. Min A to bring the LE back into the bed. She was left supine with all needs met. O2 stable throughout session on 2L O2 via [place].   Therapy Documentation Precautions:  Precautions Precautions: Fall Precaution Comments: monitor O2 Restrictions Weight Bearing Restrictions: No RLE Weight Bearing: Partial weight bearing LLE Weight Bearing: Partial weight bearing  Therapy/Group: Individual Therapy  Curtis Sites 04/07/2021, 11:04 AM

## 2021-04-07 NOTE — Progress Notes (Signed)
Placed patient on bipap for the night with oxygen set at 3lpm

## 2021-04-07 NOTE — Progress Notes (Addendum)
[facility] Progress Note   Summary:  56 yo w/ hx breast cancer, anemia, CKD, DM2, HTN, gout, obesity, hx PE, afib on eliquis, ESRD recent start to HD on 02/25/21 at [facility]. Pt had repeat admits for a/c resp failure/ CHF and progression of CKD to ESRD needing dialysis. Last admit at [facility] on 03/17/21 for a/c CHF, resp failure and L ureteral stone sp stent placement and rx'd ESBL UTI w/ abx. Pt debilitated after all of this and was tx'd to [facility] for rehab on 03/28/21.    Assessment/ Plan:   Assessment/Plan:  Debility - following prolonged hospitalizations.  In CIR.   Chronic respiratory failure/hypercapnic respiratory failure - on 2L O2 prior to last admission, uses bipap overnight.  Acute resp failure - on 2/19 had emergent HD. Resolved.  ESRD -  on HD TTS. HD today.   Hypertension - BP's up after stopping norvasc 10 qd, will resume norvasc in 1-2 days if needed, but lets see 1st if this will allow a larger UF on HD. OK to cont toprol xl 12.5 qd for afib. But low BP's limit UF. Added midodrine 10 mg pre HD TTS.  Volume overload - persistent problem since recent start of HD in Jan. LE edema is getting better per pt, "I can see my feet!". Stand wt today 157.8kg, doing better w/ fluids. Cont to lower vol, only able to get 2-2.5 L off each hd.   Anemia of CKD - Hgb 8- 9 range here. SP Aranesp 100ug on 2/15, next dose today 2/23 then weekly on thursdays. Fe load started 2/21.   Secondary Hyperparathyroidism -  Calcium and phos in goal.  Continue VDRA.  renal diet, on revela. Cal trending up, consider holding os-cal  Recent ESBL UTI/Hematuria/L ureteral stone-  sp stent placement on 02/14/21. Repeat US showed resolution of hydronephrosis.  F/u urology at OP Nutrition - Renal diet w/fluid restrictions.  Hx A fib - on eliquis, toprol xl 12.5 qd. DMT2 - per PMD Access: RIJ TDC. Patient wishes to have her AVF/AVG done as an outpatient/post discharge which is reasonable Dispo: EDD 3/1  Kelly Splinter, MD 04/07/2021, 11:06 AM    OP HD: [facility] TTS [address]   4h  400/600  166.5kg  Hep 2000 + 200u/hr  RIJ permcath   - Hep B SAg neg here on 2/14, Covid neg 2/05  - had only 1 outpt Rx on 03/17/21  - no meds started   Subjective:   Seen in room, no c/o's. today   Objective:   BP (!) 159/88 (BP Location: Left Arm)    Pulse 80    Temp 98 F (36.7 C) (Oral)    Resp 16    Ht 5\' 4"  (1.626 m)    Wt (!) 161.4 kg    LMP 07/17/2012    SpO2 100%    BMI 61.08 kg/m   Intake/Output Summary (Last 24 hours) at 04/07/2021 1106 Last data filed at 04/07/2021 0900 Gross per 24 hour  Intake 990 ml  Output --  Net 990 ml    Weight change: 3.6 kg  Physical Exam: Gen: alert CVS:rrr, s1s2 Resp: poor air exchange b/l, on bipap IWL:NLGXQ, soft Ext: diffuse 1-2+  pitting edema b/l Les Neuro: alert, nonfocal Dialysis access: RIJ TDC c/d/i  Imaging: No results found.  Labs: BMET Recent Labs  Lab 03/31/21 1259 04/02/21 1257 04/05/21 1339  NA 129* 128* 126*  K 4.2 5.0 5.1  CL 93* 93* 91*  CO2 27 27 26   GLUCOSE 117* 133* 117*  BUN 45* 40* 71*  CREATININE 5.36* 4.92* 6.26*  CALCIUM 10.6* 10.0 10.0  PHOS 5.9* 8.0* 6.8*    CBC Recent Labs  Lab 04/02/21 1257 04/05/21 1355 04/06/21 1324  WBC 10.1 14.3* 11.1*  NEUTROABS  --   --  7.9*  HGB 8.2* 8.1* 8.7*  HCT 29.9* 29.1* 30.0*  MCV 100.3* 97.7 95.5  PLT 287 318 324     Medications:     apixaban  5 mg Oral BID   atorvastatin  20 mg Oral QHS   bisacodyl  10 mg Rectal Daily   calcitRIOL  0.25 mcg Oral Daily   calcium carbonate  1,250 mg Oral Q breakfast   carbamide peroxide  5 drop Left EAR BID   chlorhexidine  15 mL Mouth Rinse BID   Chlorhexidine Gluconate Cloth  6 each Topical Q0600   darbepoetin (ARANESP) injection - DIALYSIS  100 mcg Intravenous Q Thu-HD   diclofenac Sodium  2 g Topical QID   famotidine  10 mg Oral Daily   feeding supplement (NEPRO CARB STEADY)  237 mL Oral BID BM   folic acid  5,701 mcg  Oral Daily   gabapentin  300 mg Oral QHS   Gerhardt's butt cream  3 application Topical TID   insulin aspart  0-15 Units Subcutaneous TID WC   levothyroxine  112 mcg Oral QAC breakfast   mouth rinse  15 mL Mouth Rinse q12n4p   metoprolol succinate  12.5 mg Oral Daily   midodrine  10 mg Oral Q T,Th,Sa-HD   multivitamin  1 tablet Oral QHS   sevelamer carbonate  800 mg Oral TID WC   Vitamin D (Ergocalciferol)  50,000 Units Oral Weekly

## 2021-04-08 LAB — GLUCOSE, CAPILLARY
Glucose-Capillary: 89 mg/dL (ref 70–99)
Glucose-Capillary: 116 mg/dL — ABNORMAL HIGH (ref 70–99)
Glucose-Capillary: 119 mg/dL — ABNORMAL HIGH (ref 70–99)

## 2021-04-08 LAB — URINE CULTURE: Culture: 80000 — AB

## 2021-04-08 NOTE — Progress Notes (Signed)
Occupational Therapy Session Note  Patient Details  Name: Nicole Schmidt MRN: [id number] Date of Birth: 02-Oct-1965  Today's Date: 04/08/2021 OT Individual Time: 1337-1430 OT Individual Time Calculation (min): 53 min    Short Term Goals: Week 1:  OT Short Term Goal 1 (Week 1): STG = LTG 2/2 ELOS  Skilled Therapeutic Interventions/Progress Updates:  Skilled OT intervention completed with focus on BUE/cardiovascular endurance, functional transfers. Pt received seated in recliner, with NT in room assessing pt's vitals. Pt declining self-care this session, with pt verbose, presumably to avoid therapy activities due to c/o fatigue. Pt agreeable to out of room activity. Pt completed sit > stand without AD, with supervision, then ambulatory transfer to w/c in room with RW with supervision. Therapist transported pt with total A in w/c for energy conservation to ortho gym for participation in endurance task at [facility].  Pt completed the following intervals to promote endurance needed for functional tasks: 3 mins, forwards, level 3 3 mins, forwards, level 5 3 mins, backwards, level 5  Pt required rest breaks due to fatigue, with education about pursed lip breathing throughout exercises provided. Pt asymptomatic of SOB, with stable O2 sat >90% throughout session on 2L. Per nursing request, therapist assisted pt to bed for suppository placement. Pt completed bed mobility with min A, and use of bed features for pt to boost self up towards head of bed with min A. Pt left upright in bed, with bed alarm on and all needs in reach at end of session.   Therapy Documentation Precautions:  Precautions Precautions: Fall Precaution Comments: monitor O2 Restrictions Weight Bearing Restrictions: No   Pain: Unrated pain in bottom of R foot, declined intervention   Therapy/Group: Individual Therapy  Rossie Bretado E Eyvette Cordon 04/08/2021, 7:43 AM

## 2021-04-08 NOTE — Progress Notes (Signed)
[facility] Progress Note   Summary:  56 yo w/ hx breast cancer, anemia, CKD, DM2, HTN, gout, obesity, hx PE, afib on eliquis, ESRD recent start to HD on 02/25/21 at [facility]. Pt had repeat admits for a/c resp failure/ CHF and progression of CKD to ESRD needing dialysis. Last admit at [facility] on 03/17/21 for a/c CHF, resp failure and L ureteral stone sp stent placement and rx'd ESBL UTI w/ abx. Pt debilitated after all of this and was tx'd to [facility] for rehab on 03/28/21.    Assessment/ Plan:   Assessment/Plan:  Debility - following prolonged hospitalizations.  In CIR.   Chronic respiratory failure/hypercapnic respiratory failure - on 2L O2 prior to last admission, uses bipap overnight.  Acute resp failure - on 2/19 had emergent HD. Resolved.  ESRD -  on HD TTS. HD tomorrow.   Hypertension - BP's good today. OK to cont toprol xl 12.5 qd for afib. Getting midodrine 10 mg pre HD TTS.  Volume overload - persistent problem since recent start of HD in Jan. Wt's continue to drop and LE edema is still substantial. Cont to lower vol as tolerated.   Anemia of CKD - Hgb 8- 9 range here. SP Aranesp 100ug on 2/15, next dose today 2/23 then weekly on thursdays. Fe load started 2/21.   Secondary Hyperparathyroidism -  Calcium and phos in goal.  Continue VDRA.  renal diet, on revela. Cal trending up, consider holding os-cal  Recent ESBL UTI/Hematuria/L ureteral stone-  sp stent placement on 02/14/21. Repeat US showed resolution of hydronephrosis.  F/u urology at OP Nutrition - Renal diet w/fluid restrictions.  Hx A fib - on eliquis, toprol xl 12.5 qd. DMT2 - per PMD Access: RIJ TDC. Patient wishes to have her AVF/AVG done as an outpatient/post discharge which is reasonable Dispo: EDD 3/1  Kelly Splinter, MD 04/08/2021, 12:20 PM    OP HD: [facility] TTS [address]   4h  400/600  166.5kg  Hep 2000 + 200u/hr  RIJ permcath   - Hep B SAg neg here on 2/14, Covid neg 2/05  - had only 1 outpt Rx on  03/17/21  - no meds started   Subjective:   Seen in room, no c/o's. today   Objective:   BP (!) 130/58 (BP Location: Left Arm)    Pulse 77    Temp 98.2 F (36.8 C)    Resp 20    Ht 5\' 4"  (1.626 m)    Wt (!) 156.2 kg    LMP 07/17/2012    SpO2 91%    BMI 59.11 kg/m   Intake/Output Summary (Last 24 hours) at 04/08/2021 1220 Last data filed at 04/08/2021 0900 Gross per 24 hour  Intake 680 ml  Output 3500 ml  Net -2820 ml    Weight change: -3.1 kg  Physical Exam: Gen: alert CVS:rrr, s1s2 Resp: poor air exchange b/l, on bipap QQI:WLNLG, soft Ext: diffuse 1-2+  pitting edema b/l Les Neuro: alert, nonfocal Dialysis access: RIJ TDC c/d/i  Imaging: No results found.  Labs: BMET Recent Labs  Lab 04/02/21 1257 04/05/21 1339 04/07/21 1426  NA 128* 126* 128*  K 5.0 5.1 4.4  CL 93* 91* 92*  CO2 27 26 26   GLUCOSE 133* 117* 122*  BUN 40* 71* 56*  CREATININE 4.92* 6.26* 5.29*  CALCIUM 10.0 10.0 9.1  PHOS 8.0* 6.8* 4.8*    CBC Recent Labs  Lab 04/02/21 1257 04/05/21 1355 04/06/21 1324 04/07/21 1425  WBC  10.1 14.3* 11.1* 10.3  NEUTROABS  --   --  7.9*  --   HGB 8.2* 8.1* 8.7* 7.9*  HCT 29.9* 29.1* 30.0* 26.3*  MCV 100.3* 97.7 95.5 94.9  PLT 287 318 324 301     Medications:     apixaban  5 mg Oral BID   atorvastatin  20 mg Oral QHS   bisacodyl  10 mg Rectal Daily   calcitRIOL  0.25 mcg Oral Daily   calcium carbonate  1,250 mg Oral Q breakfast   carbamide peroxide  5 drop Both EARS Daily   chlorhexidine  15 mL Mouth Rinse BID   Chlorhexidine Gluconate Cloth  6 each Topical Q0600   darbepoetin (ARANESP) injection - DIALYSIS  100 mcg Intravenous Q Thu-HD   diclofenac Sodium  2 g Topical QID   famotidine  10 mg Oral Daily   feeding supplement (NEPRO CARB STEADY)  237 mL Oral BID BM   folic acid  7,425 mcg Oral Daily   gabapentin  300 mg Oral QHS   Gerhardt's butt cream  3 application Topical TID   insulin aspart  0-15 Units Subcutaneous TID WC   levothyroxine   112 mcg Oral QAC breakfast   mouth rinse  15 mL Mouth Rinse q12n4p   metoprolol succinate  12.5 mg Oral Daily   midodrine  10 mg Oral Q T,Th,Sa-HD   multivitamin  1 tablet Oral QHS   sevelamer carbonate  800 mg Oral TID WC   Vitamin D (Ergocalciferol)  50,000 Units Oral Weekly

## 2021-04-08 NOTE — Progress Notes (Signed)
Occupational Therapy Session Note  Patient Details  Name: LEONOR DARNELL MRN: [id number] Date of Birth: [birth year]  Today's Date: 04/08/2021 OT Individual Time: 8902-2840 OT Individual Time Calculation (min): 55 min    Short Term Goals: Week 1:  OT Short Term Goal 1 (Week 1): STG = LTG 2/2 ELOS  Skilled Therapeutic Interventions/Progress Updates:     Pt received seated in recliner, reports ongoing R ankle pain due to gout flare up but reports much improved, agreeable to therapy. Session focus on activity tolerance, dynamic standing balance, transfer retraining in prep for improved ADL/IADL/func mobility performance + decreased caregiver burden. Short ambulatory transfers throughout session with RW and close S.  SatO2 at 88-94% on 2L throughout session, cues for PLB throughout. Total A w/c transport to and from gym due to fatigue. Declined need for ADL.  Pt completed 2 rounds of corn hole with close S for balance and retrieving bags from R/L vantage points. Required seated rest break due to fatigue prior to completing first round. Pt ambulated back to nurses station with close S and RW before requiring transport back to room due to fatigue.  Pt left seated in recliner on 2L O2, call bell in reach, and all immediate needs met.   Therapy Documentation Precautions:  Precautions Precautions: Fall Precaution Comments: monitor O2 Restrictions Weight Bearing Restrictions: No RLE Weight Bearing: Partial weight bearing LLE Weight Bearing: Partial weight bearing  Pain:see session note   ADL: See Care Tool for more details.  Therapy/Group: Individual Therapy  Volanda Napoleon MS, OTR/L  04/08/2021, 6:55 AM

## 2021-04-08 NOTE — Progress Notes (Signed)
Physical Therapy Session Note  Patient Details  Name: Nicole Schmidt MRN: [id number] Date of Birth: [birth year]  Today's Date: 04/08/2021 PT Individual Time: 0800-0856 PT Individual Time Calculation (min): 56 min   Short Term Goals: Week 1:  PT Short Term Goal 1 (Week 1): pt will perform transfer with LRAD and CGA PT Short Term Goal 1 - Progress (Week 1): Progressing toward goal PT Short Term Goal 2 (Week 1): pt will ambulate 84f with LRAD and CGA PT Short Term Goal 2 - Progress (Week 1): Met PT Short Term Goal 3 (Week 1): pt will initate stair training PT Short Term Goal 3 - Progress (Week 1): Met Week 2:  PT Short Term Goal 1 (Week 2): pt will perform transfers with LRAD and CGA consistantly PT Short Term Goal 2 (Week 2): pt will ambulate 267fwith LRAD and supervision PT Short Term Goal 3 (Week 2): pt will perform simulated car transfer with LRAD and min A  Skilled Therapeutic Interventions/Progress Updates:   Received pt semi-reclined in bed with RN present administering medications Pt agreeable to PT treatment and denied any pain at rest. Session with emphasis on functional mobility/transfers, toileting, generalized strengthening and endurance, dynamic standing balance/coordination, gait training, and simulated car transfers. Pt on 2L O2 via [place] with sats 94-96% at rest but dropping into low 80% with mobility (unsure of accuracy of pulse ox as pt denied any SOB). Pt transferred semi-reclined<>sitting EOB with HOB elevated and heavy use of bedrails with supervision. Sit<>stand with RW and light min A and ambulated 1013fith RW and CGA into bathroom - pt reported increased ankle discomfort this morning > yesterday. Pt continent of bladder and stood from regular height toilet with min A. Pt ambulated 60f6fth RW and supervision with total A to manage WC and O2 tank. [facility] and performed ambulatory simulated car transfer with RW  and supervision - cues for technique to scoot hips back on seat prior to getting legs in. Pt reported thinking CSW arranged transport home but did not realize it was ambulance transport - reached out to CSW to cancel ambulance transport home as pt is able to get in/out of the car. Encouraged pt to reach out to her cousin (who is picking her up) to find out what kind of car she will D/C home in and measure height from ground. Also encouraged pt to reach out to her friend to go ahead and start building ramp in preparation for discharge next week. Pt then ambulated 10ft55funeven surfaces (ramp) with RW and supervision with total A to manage O2 tank. Returned to room, [facility], and ambulated 15ft 7f RW and supervision to recliner. Concluded session with pt sitting in recliner with all needs within reach.   Therapy Documentation Precautions:  Precautions Precautions: Fall Precaution Comments: monitor O2 Restrictions Weight Bearing Restrictions: No RLE Weight Bearing: Partial weight bearing LLE Weight Bearing: Partial weight bearing  Therapy/Group: Individual Therapy Mollee Neer MAlfonse AlpersPT   04/08/2021, 7:12 AM

## 2021-04-08 NOTE — Progress Notes (Signed)
PROGRESS NOTE   Subjective/Complaints: Complains of worsening right ankle pain. She feels that it is going to rain today. Discussed that it rained last yesterday evening and she hadn't realized that Sleeping better- receiving CPAP at night  ROS: +lower extremity edema, denies pain, SOB, +sensation of water in ear, +right ankle pain- chronic, improved. +hypoxia, +daytime somnolence- improved today, insomnia improved  Objective:   No results found. Recent Labs    04/06/21 1324 04/07/21 1425  WBC 11.1* 10.3  HGB 8.7* 7.9*  HCT 30.0* 26.3*  PLT 324 301    Recent Labs    04/05/21 1339 04/07/21 1426  NA 126* 128*  K 5.1 4.4  CL 91* 92*  CO2 26 26  GLUCOSE 117* 122*  BUN 71* 56*  CREATININE 6.26* 5.29*  CALCIUM 10.0 9.1     Intake/Output Summary (Last 24 hours) at 04/08/2021 1246 Last data filed at 04/08/2021 0900 Gross per 24 hour  Intake 440 ml  Output 3500 ml  Net -3060 ml        Physical Exam: Vital Signs Blood pressure (!) 130/58, pulse 77, temperature 98.2 F (36.8 C), resp. rate 20, height 5' 4" (1.626 m), weight (!) 156.2 kg, last menstrual period 07/17/2012, SpO2 91 %.  General: No acute distress, BMI 59.22 Mood and affect are appropriate Heart: Regular rate and rhythm no rubs murmurs or extra sounds Lungs: Clear to auscultation, breathing unlabored, no rales or wheezes Abdomen: Positive bowel sounds, soft nontender to palpation, nondistended Extremities: No clubbing, cyanosis, or edema  Skin: Stasis dermatitis bilateral pre tibial  Neurologic: Cranial nerves II through XII intact, motor strength is 5/5 in bilateral deltoid, bicep, tricep, grip, 3-/ 5 hip flexor, knee extensors, ankle dorsiflexor and plantar flexor Sensory exam normal sensation to light touch and proprioception in bilateral upper and lower extremities. + allodynia of lower extremities Cerebellar exam normal finger to nose to finger  as well as heel to shin in bilateral upper and lower extremities Musculoskeletal: Full range of motion in all 4 extremities. +right ankle swelling, tender to palpation Difficulty with supine to sit Ambulating from bathroom to wheelchair in hallway S with RW   Assessment/Plan: 1. Functional deficits which require 3+ hours per day of interdisciplinary therapy in a comprehensive inpatient rehab setting. Physiatrist is providing close team supervision and 24 hour management of active medical problems listed below. Physiatrist and rehab team continue to assess barriers to discharge/monitor patient progress toward functional and medical goals  Care Tool:  Bathing    Body parts bathed by patient: Right arm, Left arm, Chest, Abdomen, Front perineal area, Right upper leg, Left upper leg, Face (Patient was able to simulate bathing UB, but was met with challenges for LB funciton for lower legs and feet.)   Body parts bathed by helper: Right lower leg, Left lower leg, Buttocks (Areas the pt was unable to address)     Bathing assist Assist Level: Maximal Assistance - Patient 24 - 49% (per OT evaluation note) Assistive Device Comment: Long handled sponge, seated bathing (patient would benefit from long handle sponge and is reported as having a raise toilet already in place.)   Upper Body Dressing/Undressing Upper  body dressing   What is the patient wearing?: Pull over shirt    Upper body assist Assist Level: Minimal Assistance - Patient > 75%    Lower Body Dressing/Undressing Lower body dressing      What is the patient wearing?: Pants     Lower body assist Assist for lower body dressing: Supervision/Verbal cueing Assistive Device Comment: with reacher   Toileting Toileting Toileting Activity did not occur (Clothing management and hygiene only): Refused  Toileting assist Assist for toileting: Minimal Assistance - Patient > 75%     Transfers Chair/bed transfer  Transfers assist   Chair/bed transfer activity did not occur: Refused (additional assessing needed in this area)  Chair/bed transfer assist level: Contact Guard/Touching assist Chair/bed transfer assistive device: Programmer, multimedia   Ambulation assist   Ambulation activity did not occur: Refused (additional assessing needed in this area)  Assist level: Supervision/Verbal cueing Assistive device: Walker-rolling Max distance: 13f   Walk 10 feet activity   Assist  Walk 10 feet activity did not occur: Safety/medical concerns (weakness, fatigue, deconditioning)  Assist level: Supervision/Verbal cueing Assistive device: Walker-rolling   Walk 50 feet activity   Assist Walk 50 feet with 2 turns activity did not occur: Safety/medical concerns (weakness, fatigue, deconditioning)  Assist level: Contact Guard/Touching assist Assistive device: Walker-rolling    Walk 150 feet activity   Assist Walk 150 feet activity did not occur: Safety/medical concerns (weakness, fatigue, deconditioning)         Walk 10 feet on uneven surface  activity   Assist Walk 10 feet on uneven surfaces activity did not occur: Safety/medical concerns (weakness, fatigue, deconditioning)   Assist level: Supervision/Verbal cueing Assistive device: Walker-rolling   Wheelchair     Assist Is the patient using a wheelchair?: Yes Type of Wheelchair: Manual Wheelchair activity did not occur: Safety/medical concerns (weakness, fatigue, deconditioning)         Wheelchair 50 feet with 2 turns activity    Assist    Wheelchair 50 feet with 2 turns activity did not occur: Safety/medical concerns (weakness, fatigue, deconditioning)       Wheelchair 150 feet activity     Assist  Wheelchair 150 feet activity did not occur: Safety/medical concerns (weakness, fatigue, deconditioning)       Blood pressure (!) 130/58, pulse 77, temperature 98.2 F (36.8 C), resp. rate 20, height 5' 4" (1.626  m), weight (!) 156.2 kg, last menstrual period 07/17/2012, SpO2 91 %.    Medical Problem List and Plan: 1. Functional deficits secondary to debility/acute on chronic hypercapnia respiratory failure.  Oxygen therapy as directed/BiPAP             -patient may  shower             -ELOS/Goals: 16 days Mod I goals for mobility and self care   Discussed d/c date with her  Continue CIR 2.  Antithrombotics: -DVT/anticoagulation:  Pharmaceutical: Other (comment) Eliquis             -antiplatelet therapy: N/A 3. Pain from pressure injury: improved, Neurontin 300 mg nightly, decrease tramadol to q12H prn for moderate pain 4. Mood: Melatonin as needed             -antipsychotic agents: N/A 5. Neuropsych: This patient is capable of making decisions on her own behalf. 6. Skin/Wound Care: Routine skin checks 7. Fluids/Electrolytes/Nutrition: Routine in and outs with follow-up chemistries 8.  End-stage renal disease.  Continue hemodialysis as per renal services.  Permacath placement  02/25/2021.Marland Kitchen Discussed her recent creatinine levels with her 9.  Hypotension   Norvasc 5 mg daily, decrease Toprol XL to 12.5 mg daily.  Monitor with increased mobility. Decrease Tramadol to q8H prn.  10.  History of atrial fibrillation/pulmonary emboli/acute on chronic CHF.  Continue Eliquis.  Cardiac rate controlled. continue beta-blocker.Monitor for fluid overload Vitals:   04/07/21 2350 04/08/21 0539  BP:  (!) 130/58  Pulse: 67 77  Resp: 18 20  Temp:    SpO2: 93% 91%    11.  Hypothyroidism.  Synthroid 12.  Hyperlipidemia.  Lipitor 13.  History of chronic anemia. 14.  Diabetes mellitus.  Latest hemoglobin A1c 7.2.  SSI. Placed nursing order for no fluids other than water.  15.  History of hematuria.  Left ureteral stone with recent ureteroscopy/laser lithotripsy.  Repeat ultrasound showed resolution of left hydronephrosis.  Follow-up Dr.Sninsky. 16.  Morbid obesity.  BMI 62.74-->60.55. Provided dietary education.   17.  Acute blood loss anemia/leukocytosis.  Follow-up chemistries 18.  Urine culture positive ESBL/Proteus/E. coli.  Treat empirically with  one dose fosfomycin 2/9.  Contact precautions. Discussed with her that she will need these until day of discharge.  19.Leukocytosis.  Resolved. discussed that UA is eqiuvocal, f/u UC. continue to monitor w/ HD. Follow-up per infectious disease Dr.Ravisankar and presently maintained on IV Invanz x 4 does. 20. Sensation of water in ears:  d/c antibiotic drop, continue debrox bilateral ears daily 21. Right ankle pain related to osteoarthritis, will start Voltaren gel has as needed oxycodone for more severe pain, placed nursing order to ice 15 minutes three times per day, messaged nurse about this 22. Somnolence: worsened by oxycodone: discussed limiting use for only severe ankle pain.  23. OSA: discussed importance of using CPAP at night.  24. Insomnia: asked nursing to place sign on door not to wake before 7am  LOS: 11 days A FACE TO FACE EVALUATION WAS PERFORMED  Clide Deutscher Raulkar 04/08/2021, 12:46 PM

## 2021-04-08 NOTE — Progress Notes (Signed)
Patient ID: Nicole Schmidt, female   DOB: [birth year], 56 y.o.   MRN: [id number]  New HH orders sent to [facility]

## 2021-04-09 DIAGNOSIS — N186 End stage renal disease: Secondary | ICD-10-CM

## 2021-04-09 DIAGNOSIS — R0902 Hypoxemia: Secondary | ICD-10-CM

## 2021-04-09 DIAGNOSIS — E669 Obesity, unspecified: Secondary | ICD-10-CM

## 2021-04-09 DIAGNOSIS — E1165 Type 2 diabetes mellitus with hyperglycemia: Secondary | ICD-10-CM

## 2021-04-09 DIAGNOSIS — Z992 Dependence on renal dialysis: Secondary | ICD-10-CM

## 2021-04-09 DIAGNOSIS — D62 Acute posthemorrhagic anemia: Secondary | ICD-10-CM

## 2021-04-09 DIAGNOSIS — E119 Type 2 diabetes mellitus without complications: Secondary | ICD-10-CM

## 2021-04-09 DIAGNOSIS — G4733 Obstructive sleep apnea (adult) (pediatric): Secondary | ICD-10-CM

## 2021-04-09 LAB — GLUCOSE, CAPILLARY
Glucose-Capillary: 92 mg/dL (ref 70–99)
Glucose-Capillary: 134 mg/dL — ABNORMAL HIGH (ref 70–99)
Glucose-Capillary: 86 mg/dL (ref 70–99)
Glucose-Capillary: 155 mg/dL — ABNORMAL HIGH (ref 70–99)

## 2021-04-09 MED ORDER — HEPARIN SODIUM (PORCINE) 1000 UNIT/ML IJ SOLN
INTRAMUSCULAR | Status: AC
  Filled 2021-04-09: qty 4

## 2021-04-09 NOTE — Progress Notes (Signed)
PROGRESS NOTE   Subjective/Complaints: Patient seen laying in bed this morning.  She states she slept well overnight.  CPAP in place.  ROS: + Lower extremity edema  Objective:   No results found. Recent Labs    04/06/21 1324 04/07/21 1425  WBC 11.1* 10.3  HGB 8.7* 7.9*  HCT 30.0* 26.3*  PLT 324 301     Recent Labs    04/07/21 1426  NA 128*  K 4.4  CL 92*  CO2 26  GLUCOSE 122*  BUN 56*  CREATININE 5.29*  CALCIUM 9.1      Intake/Output Summary (Last 24 hours) at 04/09/2021 1223 Last data filed at 04/08/2021 1846 Gross per 24 hour  Intake 560 ml  Output --  Net 560 ml         Physical Exam: Vital Signs Blood pressure 133/72, pulse 86, temperature 97.9 F (36.6 C), temperature source Oral, resp. rate 16, height _0  (1.626 m), weight (!) 156.2 kg, last menstrual period 07/17/2012, SpO2 98 %.  General: No acute distress, superobese. Mood and affect are appropriate Heart: Regular rate and rhythm no rubs murmurs or extra sounds Lungs: Clear to auscultation, breathing unlabored, no rales or wheezes Abdomen: Positive bowel sounds, soft nontender to palpation, nondistended Extremities: No clubbing, cyanosis.  Lower extremity edema. Skin: Stasis dermatitis bilateral pre tibial  Neurologic: Alert Right lower extremity: 4-/5 proximal distal Left lower extremity: 3/5 proximal distal   Assessment/Plan: 1. Functional deficits which require 3+ hours per day of interdisciplinary therapy in a comprehensive inpatient rehab setting. Physiatrist is providing close team supervision and 24 hour management of active medical problems listed below. Physiatrist and rehab team continue to assess barriers to discharge/monitor patient progress toward functional and medical goals  Care Tool:  Bathing    Body parts bathed by patient: Right arm, Left arm, Chest, Abdomen, Front perineal area, Right upper leg, Left upper  leg, Face (Patient was able to simulate bathing UB, but was met with challenges for LB funciton for lower legs and feet.)   Body parts bathed by helper: Right lower leg, Left lower leg, Buttocks (Areas the pt was unable to address)     Bathing assist Assist Level: Maximal Assistance - Patient 24 - 49% (per OT evaluation note) Assistive Device Comment: Long handled sponge, seated bathing (patient would benefit from long handle sponge and is reported as having a raise toilet already in place.)   Upper Body Dressing/Undressing Upper body dressing   What is the patient wearing?: Pull over shirt    Upper body assist Assist Level: Minimal Assistance - Patient > 75%    Lower Body Dressing/Undressing Lower body dressing      What is the patient wearing?: Pants     Lower body assist Assist for lower body dressing: Supervision/Verbal cueing Assistive Device Comment: with reacher   Toileting Toileting Toileting Activity did not occur (Clothing management and hygiene only): Refused  Toileting assist Assist for toileting: Minimal Assistance - Patient > 75%     Transfers Chair/bed transfer  Transfers assist  Chair/bed transfer activity did not occur: Refused (additional assessing needed in this area)  Chair/bed transfer assist level: Supervision/Verbal cueing Chair/bed transfer  assistive device: Museum/gallery exhibitions officer assist   Ambulation activity did not occur: Refused (additional assessing needed in this area)  Assist level: Supervision/Verbal cueing Assistive device: Walker-rolling Max distance: 60   Walk 10 feet activity   Assist  Walk 10 feet activity did not occur: Safety/medical concerns (weakness, fatigue, deconditioning)  Assist level: Supervision/Verbal cueing Assistive device: Walker-rolling   Walk 50 feet activity   Assist Walk 50 feet with 2 turns activity did not occur: Safety/medical concerns (weakness, fatigue,  deconditioning)  Assist level: Supervision/Verbal cueing Assistive device: Walker-rolling    Walk 150 feet activity   Assist Walk 150 feet activity did not occur: Safety/medical concerns (weakness, fatigue, deconditioning)         Walk 10 feet on uneven surface  activity   Assist Walk 10 feet on uneven surfaces activity did not occur: Safety/medical concerns (weakness, fatigue, deconditioning)   Assist level: Supervision/Verbal cueing Assistive device: Walker-rolling   Wheelchair     Assist Is the patient using a wheelchair?: Yes Type of Wheelchair: Manual Wheelchair activity did not occur: Safety/medical concerns (weakness, fatigue, deconditioning)         Wheelchair 50 feet with 2 turns activity    Assist    Wheelchair 50 feet with 2 turns activity did not occur: Safety/medical concerns (weakness, fatigue, deconditioning)       Wheelchair 150 feet activity     Assist  Wheelchair 150 feet activity did not occur: Safety/medical concerns (weakness, fatigue, deconditioning)       Blood pressure 133/72, pulse 86, temperature 97.9 F (36.6 C), temperature source Oral, resp. rate 16, height _0  (1.626 m), weight (!) 156.2 kg, last menstrual period 07/17/2012, SpO2 98 %.    Medical Problem List and Plan: 1. Functional deficits secondary to debility/acute on chronic hypercapnia respiratory failure.  Oxygen therapy as directed/BiPAP  Continue CIR 2.  Antithrombotics: -DVT/anticoagulation:  Pharmaceutical: Other (comment) Eliquis             -antiplatelet therapy: N/A 3. Pain from pressure injury: improved, Neurontin 300 mg nightly, decrease tramadol to q12H prn for moderate pain  Controlled on 2/25 4. Mood: Melatonin as needed             -antipsychotic agents: N/A 5. Neuropsych: This patient is capable of making decisions on her own behalf. 6. Skin/Wound Care: Routine skin checks 7. Fluids/Electrolytes/Nutrition: Routine in and outs  8.   End-stage renal disease on hemodialysis.  Continue hemodialysis as per renal services.  Permacath placement 02/25/2021.Marland Kitchen Discussed her recent creatinine levels with her 9.  Hypertension: Norvasc 5 mg daily, decrease Toprol XL to 12.5 mg daily.  Monitor with increased mobility. Decrease Tramadol to q8H prn.   Controlled on 2/25 10.  History of atrial fibrillation/pulmonary emboli/acute on chronic CHF.  Continue Eliquis.  Cardiac rate controlled. continue beta-blocker.Monitor for fluid overload Vitals:   04/08/21 1349 04/09/21 0437  BP: 117/65 133/72  Pulse: 99 86  Resp: 18 16  Temp: 98.4 F (36.9 C) 97.9 F (36.6 C)  SpO2: 97% 98%    11.  Hypothyroidism.  Synthroid 12.  Hyperlipidemia.  Lipitor 13.  History of chronic anemia. 14.  Diabetes mellitus.  Latest hemoglobin A1c 7.2.  SSI. Placed nursing order for no fluids other than water.  15.  History of hematuria.  Left ureteral stone with recent ureteroscopy/laser lithotripsy.  Repeat ultrasound showed resolution of left hydronephrosis.  Follow-up Dr.Sninsky. 16.  Morbid obesity.  BMI 62.74-->60.55. Provided dietary education.  17.  Acute blood loss anemia/leukocytosis.    WBCs 10.3 on 2/23  Hemoglobin 7.9 on 2/23, continue to monitor 18.  Urine culture positive ESBL/Proteus/E. coli.  Treat empirically with  one dose fosfomycin 2/9.  Contact precautions. Discussed with her that she will need these until day of discharge.  19.Leukocytosis.  Resolved. discussed that UA is eqiuvocal, f/u UC. continue to monitor w/ HD. Follow-up per infectious disease Dr.Ravisankar and presently maintained on IV Invanz x 4 does. 20. Sensation of water in ears:  d/c antibiotic drop, continue debrox bilateral ears daily 21. Right ankle pain related to osteoarthritis, will start Voltaren gel has as needed oxycodone for more severe pain, placed nursing order to ice 15 minutes three times per day, messaged nurse about this 22. Somnolence: worsened by oxycodone:  discussed limiting use for only severe ankle pain.  23. OSA: discussed importance of using CPAP at night.   Compliant 24. Insomnia: asked nursing to place sign on door not to wake before 7am  LOS: 12 days A FACE TO FACE EVALUATION WAS PERFORMED  Oswin Johal Lorie Phenix 04/09/2021, 12:23 PM

## 2021-04-09 NOTE — Progress Notes (Signed)
Physical Therapy Session Note  Patient Details  Name: Nicole Schmidt MRN: [id number] Date of Birth: July 30, 1965  Today's Date: 04/09/2021 PT Individual Time: 0903-1004 PT Individual Time Calculation (min): 61 min   Short Term Goals: Week 1:  PT Short Term Goal 1 (Week 1): pt will perform transfer with LRAD and CGA PT Short Term Goal 1 - Progress (Week 1): Progressing toward goal PT Short Term Goal 2 (Week 1): pt will ambulate 23f with LRAD and CGA PT Short Term Goal 2 - Progress (Week 1): Met PT Short Term Goal 3 (Week 1): pt will initate stair training PT Short Term Goal 3 - Progress (Week 1): Met  Skilled Therapeutic Interventions/Progress Updates:  Pt was seen bedside in the am, on 3 liters O2. Pt transferred supine to edge of bed with S and increased time with side rail and head of bed elevated. Pt maintained balance on edge of bed with S. Pt performed multiple sit to stand and stand pivot transfers with rolling walker and S. Pt performed toilet transfers with grab bars and S. Pt ambulated 15 x 3, 30 and 60 feet with rolling walker and S with verbal cues, pt able to manage O2 tubing. Following treatment pt left sitting up in recliner with all needs within reach.   Therapy Documentation Precautions:  Precautions Precautions: Fall Precaution Comments: monitor O2 Restrictions Weight Bearing Restrictions: No RLE Weight Bearing: Partial weight bearing LLE Weight Bearing: Partial weight bearing General:   Pain: Pain Assessment Pain Scale: 0-10 Pain Score: 0-No pain  Therapy/Group: Individual Therapy  MDub Amis2/25/2023, 12:09 PM

## 2021-04-09 NOTE — Progress Notes (Signed)
Pt placed on hospital BiPAP unit with home nasal mask. Pt made comfortable.

## 2021-04-10 LAB — GLUCOSE, CAPILLARY
Glucose-Capillary: 92 mg/dL (ref 70–99)
Glucose-Capillary: 128 mg/dL — ABNORMAL HIGH (ref 70–99)
Glucose-Capillary: 144 mg/dL — ABNORMAL HIGH (ref 70–99)
Glucose-Capillary: 132 mg/dL — ABNORMAL HIGH (ref 70–99)

## 2021-04-10 NOTE — Progress Notes (Signed)
Physical Therapy Session Note  Patient Details  Name: Nicole Schmidt MRN: [id number] Date of Birth: 08/26/1965  Today's Date: 04/10/2021 PT Individual Time:  1355-1430; 5697-9480 PT Individual Time Calculation (min): 35 min, 25 min   Short Term Goals: Week 1:  PT Short Term Goal 1 (Week 1): pt will perform transfer with LRAD and CGA PT Short Term Goal 1 - Progress (Week 1): Progressing toward goal PT Short Term Goal 2 (Week 1): pt will ambulate 32f with LRAD and CGA PT Short Term Goal 2 - Progress (Week 1): Met PT Short Term Goal 3 (Week 1): pt will initate stair training PT Short Term Goal 3 - Progress (Week 1): Met Week 2:  PT Short Term Goal 1 (Week 2): pt will perform transfers with LRAD and CGA consistantly PT Short Term Goal 2 (Week 2): pt will ambulate 2110fwith LRAD and supervision PT Short Term Goal 3 (Week 2): pt will perform simulated car transfer with LRAD and min A  Skilled Therapeutic Interventions/Progress Updates:  Tx 1:  Pt resting in recliner with LEs elevated.  On 2 L O2/min, O2 sats at rest 94%. PT lowered O2 to 1.5 L/min to work on weaning. Sats briefly dropped to 88% during gait.  Therapeutic exercise performed with LE to increase strength for functional mobility. Seated with LEs elevated- 30 x 1 alternating ankle pumps; 2 x 15 bil hip internal rotations. Standing with bil UE support- 10 x 1 mini squats, calf raises, toe raises R/L hip abduction.    Sit>< stand with close supervision. Static standing balance x 1 minute with arms across chest.  Gait training with RW forward/backward x 25' with close supervision.    At end of session, pt reclined in recliner with LEs elevated, needs at hand. O2 set at 1.5 L  Tx 2:  Pt seated in recliner.  She denied pain.  On 1.5 L/min O2; O2 sats 95%. O2 increased to 2L for activity.    Sit> stand with supervision, placing hands on back of armchair. Therapeutic exercises performed with LEs to increase strength for functional  mobility: standing with bil UE support- 15 x 2 marching, 10 x 1 hamstring  curls. Seated rest q bout.  Pt desaturated to 70s with 2nd bout of marching.  With sitting, O2 sats rose to 90% in <1 min.  Wearing Crocs, reactive balance re-training in standing without UE support: pt demonstrated LOB only with anterior external perturbations.  At end of session, pt reclined in recliner with bil feet elevated.  Needs left at hand. On 1.5 L O2       Therapy Documentation Precautions:  Precautions Precautions: Fall Precaution Comments: monitor O2 Restrictions Weight Bearing Restrictions: No RLE Weight Bearing: Partial weight bearing LLE Weight Bearing: Partial weight bearing          Therapy/Group: Individual Therapy  Jamail Cullers 04/10/2021, 4:51 PM

## 2021-04-10 NOTE — Progress Notes (Signed)
[facility] Progress Note   Summary:  56 yo w/ hx breast cancer, anemia, CKD, DM2, HTN, gout, obesity, hx PE, afib on eliquis, ESRD recent start to HD on 02/25/21 at [facility]. Pt had repeat admits for a/c resp failure/ CHF and progression of CKD to ESRD needing dialysis. Last admit at [facility] on 03/17/21 for a/c CHF, resp failure and L ureteral stone sp stent placement and rx'd ESBL UTI w/ abx. Pt debilitated after all of this and was tx'd to [facility] for rehab on 03/28/21.    Assessment/ Plan:   Assessment/Plan:  Debility - following prolonged hospitalizations.  In CIR.   Chronic respiratory failure/hypercapnic respiratory failure - on 2L O2 prior to last admission, uses bipap overnight.  Acute resp failure - on 2/19 had emergent HD for resp distress. Resolved.  ESRD -  on HD TTS. Next HD 2/28.Marland Kitchen   Hypertension - BP's good today. OK to cont toprol xl 12.5 qd for afib. Getting midodrine 10 mg pre HD TTS.  Volume overload - persistent problem since recent start of HD in Jan. Wt's continue to drops still has sig LE edema. Cont to lower vol as tolerated.   Anemia of CKD - Hgb 8- 9 range here. SP Aranesp 100ug on 2/15, next dose today 2/23 then weekly on thursdays. Fe load started 2/21.   Secondary Hyperparathyroidism -  Calcium and phos in goal.  Continue VDRA.  renal diet, on revela. Cal trending up, consider holding os-cal  Recent ESBL UTI/Hematuria/L ureteral stone-  sp stent placement on 02/14/21. Repeat US showed resolution of hydronephrosis.  F/u urology at OP Nutrition - Renal diet w/fluid restrictions.  Hx A fib - on eliquis, toprol xl 12.5 qd. DMT2 - per PMD Access: RIJ TDC. Patient wishes to have her AVF/AVG done as an outpatient/post discharge which is reasonable Dispo: EDD 3/1  Kelly Splinter, MD 04/10/2021, 5:19 PM    OP HD: [facility] TTS [address]   4h  400/600  166.5kg  Hep 2000 + 200u/hr  RIJ permcath   - Hep B SAg neg here on 2/14, Covid neg 2/05  - had only 1 outpt Rx  on 03/17/21  - no meds started   Subjective:   Seen in room, no c/o's. today   Objective:   BP (!) 142/72 (BP Location: Left Arm)    Pulse 97    Temp 98.4 F (36.9 C) (Oral)    Resp 18    Ht 5\' 4"  (1.626 m)    Wt (!) 160 kg    LMP 07/17/2012    SpO2 97%    BMI 60.55 kg/m   Intake/Output Summary (Last 24 hours) at 04/10/2021 1719 Last data filed at 04/10/2021 1339 Gross per 24 hour  Intake 590 ml  Output 3000 ml  Net -2410 ml    Weight change:   Physical Exam: Gen: alert CVS:rrr, s1s2 Resp: poor air exchange b/l, on bipap UUV:OZDGU, soft Ext: diffuse 1-2+  pitting edema b/l Les Neuro: alert, nonfocal Dialysis access: RIJ TDC c/d/i  Imaging: No results found.  Labs: BMET Recent Labs  Lab 04/05/21 1339 04/07/21 1426  NA 126* 128*  K 5.1 4.4  CL 91* 92*  CO2 26 26  GLUCOSE 117* 122*  BUN 71* 56*  CREATININE 6.26* 5.29*  CALCIUM 10.0 9.1  PHOS 6.8* 4.8*    CBC Recent Labs  Lab 04/05/21 1355 04/06/21 1324 04/07/21 1425  WBC 14.3* 11.1* 10.3  NEUTROABS  --  7.9*  --  HGB 8.1* 8.7* 7.9*  HCT 29.1* 30.0* 26.3*  MCV 97.7 95.5 94.9  PLT 318 324 301     Medications:     apixaban  5 mg Oral BID   atorvastatin  20 mg Oral QHS   bisacodyl  10 mg Rectal Daily   calcitRIOL  0.25 mcg Oral Daily   calcium carbonate  1,250 mg Oral Q breakfast   carbamide peroxide  5 drop Both EARS Daily   chlorhexidine  15 mL Mouth Rinse BID   Chlorhexidine Gluconate Cloth  6 each Topical Q0600   darbepoetin (ARANESP) injection - DIALYSIS  100 mcg Intravenous Q Thu-HD   diclofenac Sodium  2 g Topical QID   famotidine  10 mg Oral Daily   feeding supplement (NEPRO CARB STEADY)  237 mL Oral BID BM   folic acid  6,759 mcg Oral Daily   gabapentin  300 mg Oral QHS   Gerhardt's butt cream  3 application Topical TID   insulin aspart  0-15 Units Subcutaneous TID WC   levothyroxine  112 mcg Oral QAC breakfast   mouth rinse  15 mL Mouth Rinse q12n4p   metoprolol succinate  12.5 mg  Oral Daily   midodrine  10 mg Oral Q T,Th,Sa-HD   multivitamin  1 tablet Oral QHS   sevelamer carbonate  800 mg Oral TID WC   Vitamin D (Ergocalciferol)  50,000 Units Oral Weekly

## 2021-04-11 ENCOUNTER — Telehealth: Payer: Self-pay

## 2021-04-11 LAB — GLUCOSE, CAPILLARY
Glucose-Capillary: 106 mg/dL — ABNORMAL HIGH (ref 70–99)
Glucose-Capillary: 143 mg/dL — ABNORMAL HIGH (ref 70–99)
Glucose-Capillary: 100 mg/dL — ABNORMAL HIGH (ref 70–99)
Glucose-Capillary: 162 mg/dL — ABNORMAL HIGH (ref 70–99)

## 2021-04-11 NOTE — Discharge Summary (Signed)
Physician Discharge Summary  Patient ID: Nicole Schmidt MRN: [id number] DOB/AGE: 04-23-1965 56 y.o.  Admit date: 03/28/2021 Discharge date: 04/15/2021  Discharge Diagnoses:  Principal Problem:   Debility Active Problems:   OSA (obstructive sleep apnea)   Hypoxia   Acute blood loss anemia   Super obese   Controlled type 2 diabetes mellitus with hyperglycemia, without long-term current use of insulin (HCC)   ESRD on hemodialysis ([place]) Hypertension Atrial fibrillation Acute on chronic CHF Hypothyroidism Hyperlipidemia History of hematuria Urine culture positive ESBL/Proteus/E. Coli   Discharged Condition: Stable  Significant Diagnostic Studies: DG Ankle Complete Right  Result Date: 04/01/2021 CLINICAL DATA:  Right ankle pain EXAM: RIGHT ANKLE - COMPLETE 3+ VIEW COMPARISON:  11/20/2004 FINDINGS: Frontal, oblique, lateral views of the right ankle are obtained. Evidence of prior healed lateral malleolar fracture. No acute fracture, subluxation, or dislocation. 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 Prominent superior and inferior calcaneal spurs. Mild diffuse subcutaneous edema. IMPRESSION: 1. Mild osteoarthritis. 2. No acute displaced fracture. 3. Prominent calcaneal spurs. Electronically Signed   By: Randa Ngo M.D.   On: 04/01/2021 17:06   CT HEAD WO CONTRAST (5MM)  Result Date: 03/20/2021 CLINICAL DATA:  Mental status change. EXAM: CT HEAD WITHOUT CONTRAST TECHNIQUE: Contiguous axial images were obtained from the base of the skull through the vertex without intravenous contrast. RADIATION DOSE REDUCTION: This exam was performed according to the departmental dose-optimization program which includes automated exposure control, adjustment of the mA and/or kV according to patient size and/or use of iterative reconstruction technique. COMPARISON:  None. FINDINGS: Brain: No evidence of acute infarction, hemorrhage, hydrocephalus, extra-axial collection or mass  lesion/mass effect. Vascular: No hyperdense vessel. Intracranial atherosclerotic disease. Skull: No osseous abnormality. Sinuses/Orbits: Visualized paranasal sinuses are clear. Visualized mastoid sinuses are clear. Visualized orbits demonstrate no focal abnormality. Other: None IMPRESSION: 1. No acute intracranial abnormality. Electronically Signed   By: Kathreen Devoid M.D.   On: 03/20/2021 14:08   US RENAL  Result Date: 03/25/2021 CLINICAL DATA:  Hydronephrosis EXAM: RENAL / URINARY TRACT ULTRASOUND COMPLETE COMPARISON:  CT done on 02/12/2021 FINDINGS: Right Kidney: Renal measurements: 10.9 x 6.9 x 6.2 cm = volume: 243.9 mL. There is no hydronephrosis. There is increased cortical echogenicity. There are few cysts each measuring less than 2.2 cm. Left Kidney: Renal measurements: 11.7 by 6 x 6.3 cm = volume: 229.73 mL. There is increased cortical echogenicity. There is no hydronephrosis. There is 11 mm cyst in the midportion. There is interval resolution of left hydronephrosis Bladder: Not distended and not adequately evaluated. Other: None. IMPRESSION: There is no hydronephrosis. Increased cortical echogenicity suggests medical renal disease. Small bilateral renal cysts. Electronically Signed   By: Elmer Picker M.D.   On: 03/25/2021 18:10   DG CHEST PORT 1 VIEW  Result Date: 04/03/2021 CLINICAL DATA:  Hypoxia.  History of diabetes, hypertension and CHF. EXAM: PORTABLE CHEST 1 VIEW COMPARISON:  03/26/2021 FINDINGS: There is a right chest wall dialysis catheter with tips in the distal SVC and cavoatrial junction. Stable cardiomediastinal contours. Aortic atherosclerosis. Diffuse pulmonary vascular congestion. Platelike atelectasis is identified in the right upper lobe with improvement in previous right upper lobe airspace disease. No new findings. IMPRESSION: 1. Improving aeration to the right upper lobe. 2. Pulmonary vascular congestion. Electronically Signed   By: Kerby Moors M.D.   On: 04/03/2021  08:00   DG Chest Port 1 View  Result Date: 03/26/2021 CLINICAL DATA:  Acute respiratory failure. EXAM: PORTABLE CHEST 1  VIEW COMPARISON:  Chest radiograph dated 03/22/2021. FINDINGS: Dialysis catheter in similar position. Similar appearance of cardiomegaly with vascular congestion and edema. Patchy area of opacity in the right upper lung field progressed since the prior radiograph and may represent atelectasis or developing infiltrate. No large pleural effusion. No pneumothorax. Atherosclerotic calcification of the aorta. No acute osseous pathology. Degenerative changes of the spine. IMPRESSION: 1. Cardiomegaly with vascular congestion and edema. 2. Patchy area of opacity in the right upper lung field progressed since the prior radiograph and may represent atelectasis or developing infiltrate. Electronically Signed   By: Anner Crete M.D.   On: 03/26/2021 19:25   DG Chest Port 1 View  Result Date: 03/22/2021 CLINICAL DATA:  Atelectasis. EXAM: PORTABLE CHEST 1 VIEW COMPARISON:  March 21, 2021. FINDINGS: Stable cardiomegaly with central pulmonary vascular congestion. Right internal jugular dialysis catheter is unchanged in position. Mild bilateral pulmonary edema may be present. Right upper lobe atelectasis is noted. Bony thorax is unremarkable. IMPRESSION: Stable cardiomegaly with central pulmonary vascular congestion and mild bilateral pulmonary edema. Right upper lobe atelectasis. Electronically Signed   By: Marijo Conception M.D.   On: 03/22/2021 14:17   DG Chest Port 1 View  Result Date: 03/21/2021 CLINICAL DATA:  56 year old female with history of shortness of breath and heart failure. Evaluate for interval changes. EXAM: PORTABLE CHEST 1 VIEW COMPARISON:  Chest x-ray 03/20/2021. FINDINGS: Right internal jugular PermCath with tips terminating in the distal superior vena cava and right atrium. Lung volumes are low. Widespread areas of interstitial prominence and patchy ill-defined airspace disease  noted throughout the lungs bilaterally, most pronounced in the right mid to upper lung and at the left lung base. Probable small left pleural effusion. No pneumothorax. Cephalization of the pulmonary vasculature. Heart size is mildly enlarged. Upper mediastinal contours are within normal limits. Atherosclerotic calcifications are noted in the thoracic aorta. IMPRESSION: 1. Minimally improved aeration in the lungs bilaterally, favored to reflect resolving multilobar bilateral bronchopneumonia, although some degree of pulmonary edema is also suspected, which could indicate concurrent congestive heart failure, particularly in light of the enlarged cardiac silhouette. 2. Small left pleural effusion. 1. Electronically Signed   By: Vinnie Langton M.D.   On: 03/21/2021 05:25   DG Chest Portable 1 View  Result Date: 03/20/2021 CLINICAL DATA:  Shortness of breath.  History of heart failure. EXAM: PORTABLE CHEST 1 VIEW COMPARISON:  February 23, 2021 FINDINGS: A double lumen right central line is in good position with 1 tip near the caval atrial junction in the other in the central SVC. No pneumothorax identified. Cardiomegaly stable. The hila and mediastinum are unchanged. Bilateral a platelike opacity seen in the right mid lung. Bilateral pulmonary opacities are identified, right greater than left. The asymmetry may be at least partially due to patient rotation. No other acute abnormalities. IMPRESSION: 1. Bilateral pulmonary opacities are nonspecific. Given history, edema is possible. However, multifocal infection is not excluded on this study due to the somewhat patchy nature of the opacities. Recommend clinical correlation and short-term follow-up imaging to ensure resolution. 2. The platelike opacity in the right mid lung could represent fluid in the fissure or atelectasis. 3. The right central line is in good position. Electronically Signed   By: Dorise Bullion III M.D.   On: 03/20/2021 10:20    Labs:  Basic  Metabolic Panel: Recent Labs  Lab 04/12/21 1409 04/14/21 1318  NA 130* 132*  K 4.3 4.1  CL 95* 94*  CO2 26 28  GLUCOSE 162* 125*  BUN 59* 46*  CREATININE 5.98* 5.14*  CALCIUM 10.1 9.5  PHOS 5.4* 5.1*    CBC: Recent Labs  Lab 04/12/21 1409 04/14/21 1317  WBC 11.0* 8.6  HGB 8.9* 8.6*  HCT 29.7* 30.4*  MCV 96.7 98.7  PLT 275 203    CBG: Recent Labs  Lab 04/13/21 1141 04/13/21 1640 04/13/21 2117 04/14/21 0616 04/14/21 1120  GLUCAP 149* 91 115* 88 146*   Family history.  Father with diabetes hypertension Parkinson's disease.  Mother with hypertension rheumatoid arthritis atrial fibrillation.  Denies any breast cancer esophageal cancer or rectal cancer  Brief HPI:   Nicole Schmidt is a 56 y.o. right-handed female with history of chronic hypoxemic respiratory failure on 2 L oxygen diabetes mellitus breast cancer history of hematuria left ureteral stone with recent left uteroscopy with laser lithotripsy and stent exchange per Dr.Sninsky, hypertension, hyperlipidemia, morbid obesity BMI 62.17 atrial fibrillation pulmonary emboli on Eliquis.  Recent hospital admission 02/01/2021 - 02/22/2021 for management of acute on chronic diastolic congestive heart failure end-stage renal disease with hemodialysis with permacath placed 02/25/2021.  Patient with CIR admission 03/04/2021 to 03/16/2021 for debility related to chronic respiratory failure superimposed with diastolic congestive heart failure.  She was discharged to home modified independent with a cane.  Patient lives alone.  Presented to [facility] 03/17/2021 with inability to ambulate and generalized lower extremity swelling as well as hypotension.  Patient became more somnolent unable to follow commands ABG showed pH 7.14/PCO2 89/bicarb 30.3 and placed on BiPAP.  Admission chemistries BUN 15 creatinine 1.99 hemoglobin 8.3 urinalysis negative nitrite troponin negative BNP 576 lactic acid 0.7.  Chest x-ray showed bilateral pulmonary opacities that  were nonspecific.  Cranial CT scan negative.  Renal service hemodialysis ongoing.  Patient remained on chronic Eliquis as prior to admission.  Patient continued oxygen therapy as directed for history of chronic hypoxemic respiratory failure with follow-up chest x-ray 03/22/2021 stable with mild bilateral pulmonary edema.  Acute on chronic anemia latest hemoglobin 8.7-9.1, leukocytosis 13700-22300 monitored per infectious disease maintain on intravenous Invanz x4 doses.  Urine culture positive for ESBL Proteus E. coli treated empirically with 1 dose of fosfomycin 2/9.  Follow-up ultrasound showed resolution of left hydronephrosis.  Therapy evaluations completed patient was admitted for a comprehensive rehab program.   Hospital Course: Nicole Schmidt was admitted to rehab 03/28/2021 for inpatient therapies to consist of PT, ST and OT at least three hours five days a week. Past admission physiatrist, therapy team and rehab RN have worked together to provide customized collaborative inpatient rehab.  Pertaining to patient's debility related to chronic hypercapnic respiratory failure oxygen therapy as directed BiPAP pending insurance approval and being established for home per social worker if not patient did have a CPAP machine at home.  She was cleared by pulmonary service to continue CPAP and would need outpatient sleep study.  Chronic Eliquis as directed for atrial fibrillation cardiac rate controlled no bleeding episodes.  Pain managed with use of Neurontin as well as tramadol for breakthrough pain.  Blood pressure control with Norvasc as well as Toprol.  Hemodialysis ongoing per renal services permacath placement 02/25/2021.  Hormone supplement for hypothyroidism.  Lipitor for hyperlipidemia.  Diabetes mellitus hemoglobin A1c 7.2 monitoring of blood sugars.  She did have a history of hematuria left ureteral stone with recent laser lithotripsy per urology services no bleeding episodes she would follow-up with  outpatient urology.  Super morbid obesity BMI 62.74 dietary follow-up.  Urine culture  positive ESBL Proteus and E. coli treated empirically with 1 dose of fosfomycin 2/9 contact precautions she remained afebrile.   Blood pressures were monitored on TID basis and soft and monitored  Diabetes has been monitored with ac/hs CBG checks and SSI was use prn for tighter BS control.    Rehab course: During patient's stay in rehab weekly team conferences were held to monitor patient's progress, set goals and discuss barriers to discharge. At admission, patient required minimal guard for feet rolling walker moderate assist sit to stand  Physical exam.  Blood pressure 114/52 pulse 78 temperature 98.1 respirations 20 oxygen saturation 98% room air Constitutional.  No acute distress HEENT Head.  Normocephalic and atraumatic Eyes.  Pupils round and reactive to light no discharge without nystagmus Neck.  Supple nontender no JVD without thyromegaly Cardiac regular rate rhythm any extra sounds or murmur heard Abdomen.  Soft nontender positive bowel sounds without rebound Respiratory effort normal no respiratory distress without wheeze Skin.  Stasis dermatitis bilateral pretibial Neurologic.  Cranial nerves II through XII intact motor strength 5/5 in bilateral deltoid bicep tricep grip, 3 -/5 hip flexors knee extensors ankle dorsi plantarflexion Cerebellar exam normal finger-to-nose testing  He/She  has had improvement in activity tolerance, balance, postural control as well as ability to compensate for deficits. He/She has had improvement in functional use RUE/LUE  and RLE/LLE as well as improvement in awareness.  Sit to stand close supervision ambulate rolling walker 25 feet close supervision working with energy conservation.  She would desaturate at times when marching in place oxygen therapy as directed.  ADLs sessions focused on activity tolerance dynamic standing balance transfer retraining.  Full family  teaching completed plan discharged to home       Disposition: Discharged to home    Diet: Diabetic renal diet  Special Instructions: No driving smoking or alcohol  Continue oxygen therapy as directed.  CPAP as directed and awaiting plan by insurance on possible BiPAP  Medications at discharge. 1.  Tylenol as needed 2.  Eliquis 5 mg p.o. twice daily 3.  Lipitor 20 mg p.o. nightly 4.  Rocaltrol 0.25 mcg p.o. daily 5.  Calcium carbonate daily 6.  Aranesp 100 mcg intravenously every Thursday with dialysis 7.  Voltaren gel 2 g 4 times daily 8.  Pepcid 10 mg p.o. daily 9.  Folic acid 1 mg p.o. daily 10.  Ferric gluconate 250 mg intravenously Tuesday Thursday Saturday hemodialysis 11.  Neurontin 300 mg p.o. nightly 12.  Synthroid 112 mcg p.o. daily 13.  Melatonin 10 mg nightly as needed 14.  Toprol-XL 12.5 mg p.o. daily 15.  ProAmatine 10 mg p.o. Tuesday Thursday Saturday 16.  Rena-Vite 1 tablet nightly 17.  Tramadol 50 mg every 12 hours as needed 18.  Renvela 800 mg p.o. 3 times daily 19.  Vitamin D 50,000 units weekly  30-35 minutes were spent completing discharge summary and discharge planning  Discharge Instructions     Ambulatory referral to Physical Medicine Rehab   Complete by: As directed    Moderate complexity follow-up 2 weeks debility/acute chronic hypercapnic respiratory failure        Follow-up Information     Raulkar, Clide Deutscher, MD Follow up.   Specialty: Physical Medicine and Rehabilitation Why: Office to call for appointment Contact information: [address] [place] [address] [phone]         Wellington Hampshire, MD Follow up.   Specialty: Cardiology Why: Call for appointment Contact information: [phone] [address] [phone]         Billey Co, MD Follow up.   Specialty: Urology Why: Call for appointment Contact information: [place]  [address] [phone]         Lenard Simmer, MD Follow up.   Specialty: Endocrinology Why: Call for appointment Contact information: [place] [address] [phone]         Dialysis, [facility] Go on 04/14/2021.   Why: Schedule is Tuesday/Thursday/Saturday.  Arrive at 10:30 for 10:45 chair time. Contact information: [address] [place] [address] [phone]         Chesley Mires, MD Follow up.   Specialty: Pulmonary Disease Why: call for appointment Contact information: [place] [address] [place] [address] [phone]                 Signed: Lavon Paganini Cowles 04/15/2021, 5:19 AM

## 2021-04-11 NOTE — Progress Notes (Signed)
Patient ID: Nicole Schmidt, female   DOB: 04-26-1965, 56 y.o.   MRN: [id number]  SW received phone call from patient family member, Manuela Schwartz. Manuela Schwartz is concerned about transportation to HD. SW reached out to patient HD navigator and SW, [place]. HD SW informed family of resources and informed family that resource can be at private expense. SW will provide additional resource to pt family member.  Shanice [phone]

## 2021-04-11 NOTE — Progress Notes (Signed)
Occupational Therapy Session Note  Patient Details  Name: Nicole Schmidt MRN: [id number] Date of Birth: 1965/03/29  Today's Date: 04/11/2021 OT Individual Time: 4709-6283 OT Individual Time Calculation (min): 58 min    Short Term Goals: Week 2:  OT Short Term Goal 1 (Week 2): pt will complete ambulatory toilet transfer with supervision with LRAD to toilet in bathroom OT Short Term Goal 2 (Week 2): Pt will complete LB dressing with AE as needed with no more than min A OT Short Term Goal 3 (Week 2): Pt will complete sit > stand with RW, in prep for ADL with supervision  Skilled Therapeutic Interventions/Progress Updates:  Skilled OT intervention completed with focus on ADL and ambulatory endurance. Pt received seated in recliner, on continuous pulse-ox  with reading of >90% sat on 2L [place] throughout session. Pt completed sit > stand and ambulatory transfer with RW and supervision to sink for standing grooming tasks with supervision. Pt completed a total of 164 feet while walking to day room with w/c follow for safety, however pt completed with supervision assist only, while using RW. Pt required intermittent standing rest breaks, vs seated, demonstrating increased endurance level. Education provided to pt about importance of this ambulatory endurance for home/community management, with education also provided on energy conservation strategies to be used in the home when she is alone. Pt transported back to room in w/c with total A due to fatigue, but was able to complete ambulatory transfer in room back to recliner with supervision using RW. Pt was left seated in recliner, with BLE elevated due to swelling, chair alarm on and all needs in reach at end of session.    Therapy Documentation Precautions:  Precautions Precautions: Fall Precaution Comments: monitor O2 Restrictions Weight Bearing Restrictions: No   Pain: No c/o pain   Therapy/Group: Individual Therapy  Alazae Crymes E Earlyn Sylvan 04/11/2021,  7:40 AM

## 2021-04-11 NOTE — Progress Notes (Signed)
[facility] Progress Note   Summary:  56 yo w/ hx breast cancer, anemia, CKD, DM2, HTN, gout, obesity, hx PE, afib on eliquis, ESRD recent start to HD on 02/25/21 at [facility]. Pt had repeat admits for a/c resp failure/ CHF and progression of CKD to ESRD needing dialysis. Last admit at [facility] on 03/17/21 for a/c CHF, resp failure and L ureteral stone sp stent placement and rx'd ESBL UTI w/ abx. Pt debilitated after all of this and was tx'd to [facility] for rehab on 03/28/21.    Assessment/ Plan:   Assessment/Plan:  Debility - following prolonged hospitalizations.  In CIR.   Chronic respiratory failure/hypercapnic respiratory failure - on 2L O2 prior to last admission, uses bipap overnight.  Acute resp failure - on 2/19 had emergent HD for resp distress. Resolved.  ESRD -  on HD TTS. Next HD 2/28.Marland Kitchen   Hypertension - BP's good today. OK to cont toprol xl 12.5 qd for afib. Getting midodrine 10 mg pre HD TTS.  Volume overload - persistent problem since recent start of HD in Jan. Wt's continue to drops still has sig LE edema. Cont to lower vol as tolerated.   Anemia of CKD - Hgb 8- 9 range here. SP Aranesp 100ug on 2/15, next dose today 2/23 then weekly on thursdays. Fe load started 2/21.   Secondary Hyperparathyroidism -  Calcium and phos in goal.  Continue VDRA.  renal diet, on revela. Cal trending up, consider holding os-cal  Recent ESBL UTI/Hematuria/L ureteral stone-  sp stent placement on 02/14/21. Repeat US showed resolution of hydronephrosis.  F/u urology at OP Nutrition - Renal diet w/fluid restrictions.  Hx A fib - on eliquis, toprol xl 12.5 qd. DMT2 - per PMD Access: RIJ TDC. Patient wishes to have her AVF/AVG done as an outpatient/post discharge which is reasonable Dispo: EDD 3/1  [name], MD 04/11/2021, 1:34 PM    OP HD: [facility] TTS [address]   4h  400/600  166.5kg  Hep 2000 + 200u/hr  RIJ permcath   - Hep B SAg neg here on 2/14, Covid neg 2/05  - had only 1 outpt  Rx on 03/17/21  - no meds started   Subjective:    Seen in room.  Nervous about going home- wants to make sure she has a BiPaP at home.     Objective:   BP 132/60 (BP Location: Left Arm)    Pulse 85    Temp 98.5 F (36.9 C) (Oral)    Resp 18    Ht 5\' 4"  (1.626 m)    Wt (!) 158 kg    LMP 07/17/2012    SpO2 100%    BMI 59.79 kg/m   Intake/Output Summary (Last 24 hours) at 04/11/2021 1334 Last data filed at 04/10/2021 1820 Gross per 24 hour  Intake 472 ml  Output --  Net 472 ml   Weight change: -5.5 kg  Physical Exam: Gen: alert CVS:rrr, s1s2 Resp: clear JEH:UDJSH, soft Ext: diffuse 1-2+  pitting edema b/l Les Neuro: alert, nonfocal Dialysis access: RIJ TDC c/d/i  Imaging: No results found.  Labs: BMET Recent Labs  Lab 04/05/21 1339 04/07/21 1426  NA 126* 128*  K 5.1 4.4  CL 91* 92*  CO2 26 26  GLUCOSE 117* 122*  BUN 71* 56*  CREATININE 6.26* 5.29*  CALCIUM 10.0 9.1  PHOS 6.8* 4.8*   CBC Recent Labs  Lab 04/05/21 1355 04/06/21 1324 04/07/21 1425  WBC 14.3* 11.1* 10.3  NEUTROABS  --  7.9*  --   HGB 8.1* 8.7* 7.9*  HCT 29.1* 30.0* 26.3*  MCV 97.7 95.5 94.9  PLT 318 324 301    Medications:     apixaban  5 mg Oral BID   atorvastatin  20 mg Oral QHS   bisacodyl  10 mg Rectal Daily   calcitRIOL  0.25 mcg Oral Daily   calcium carbonate  1,250 mg Oral Q breakfast   carbamide peroxide  5 drop Both EARS Daily   chlorhexidine  15 mL Mouth Rinse BID   Chlorhexidine Gluconate Cloth  6 each Topical Q0600   darbepoetin (ARANESP) injection - DIALYSIS  100 mcg Intravenous Q Thu-HD   diclofenac Sodium  2 g Topical QID   famotidine  10 mg Oral Daily   feeding supplement (NEPRO CARB STEADY)  237 mL Oral BID BM   folic acid  5,361 mcg Oral Daily   gabapentin  300 mg Oral QHS   Gerhardt's butt cream  3 application Topical TID   insulin aspart  0-15 Units Subcutaneous TID WC   levothyroxine  112 mcg Oral QAC breakfast   mouth rinse  15 mL Mouth Rinse q12n4p    midodrine  10 mg Oral Q T,Th,Sa-HD   multivitamin  1 tablet Oral QHS   sevelamer carbonate  800 mg Oral TID WC   Vitamin D (Ergocalciferol)  50,000 Units Oral Weekly

## 2021-04-11 NOTE — Progress Notes (Signed)
Physical Therapy Session Note  Patient Details  Name: Nicole Schmidt MRN: [id number] Date of Birth: 10-13-1965  Today's Date: 04/11/2021 PT Individual Time: [phone] and 1345-1425 PT Individual Time Calculation (min): 38 min and 40 min  Short Term Goals: Week 1:  PT Short Term Goal 1 (Week 1): pt will perform transfer with LRAD and CGA PT Short Term Goal 1 - Progress (Week 1): Progressing toward goal PT Short Term Goal 2 (Week 1): pt will ambulate 30f with LRAD and CGA PT Short Term Goal 2 - [place] (Week 1): Met PT Short Term Goal 3 (Week 1): pt will initate stair training PT Short Term Goal 3 - [place] (Week 1): Met Week 2:  PT Short Term Goal 1 (Week 2): pt will perform transfers with LRAD and CGA consistantly PT Short Term Goal 2 (Week 2): pt will ambulate 277fwith LRAD and supervision PT Short Term Goal 3 (Week 2): pt will perform simulated car transfer with LRAD and min A  Skilled Therapeutic Interventions/Progress Updates:   Treatment Session 1 Received pt semi-reclined in bed, pt agreeable to PT treatment, and reported mild pain in R ankle with mobility. Session with emphasis on functional mobility/transfers, toileting, generalized strengthening and endurance, and ambulation. Pt on 1.5L O2 via [place] with O2 sats 90-93%. Pt voiced c/o regarding going home and falling asleep without CPAP mask on. Encouraged pt to put it on anytime she feels herself getting sleepy. Pt transferred semi-reclined<>sitting EOB with HOB elevated and use of bedrails with supervision. Sit<>stand with RW and supervision and ambulated 1066f 1 and [id number] with RW and supervision to/from bathroom - O2 sats dropped to 85% but quickly increased to >90%. Pt continent of bladder and stood from regular height toilet with supervision pulling on grab bars. Took seated rest break in recliner then transferred sit<>stand with RW and supervision x 3 additional trials and performed the following exercises with emphasis on LE  strength/balance/endurance: -standing heel raises 2x10 -standing alternating marches 2x10 bilaterally  -standing hip abduction 2x10 bilaterally -seated heel/toe raises x20 O2 sats ranging from 88%-95% during exercises. Pt reports her friend is planning on coming out today to install her ramp and provided therapist with measurements for car set height. Concluded session with pt sitting in recliner with all needs within reach.   Treatment Session 2 Received pt sitting in recliner, pt agreeable to PT treatment, and denied any pain during session. Session with emphasis on functional mobility/transfers, generalized strengthening and endurance, and gait training. Pt on 1.5L O2 via [place] with sats 93-97%. Donned 2nd gown and crocs with max A for time management purposes and RN arrived to administer medications, apply cream, and requested to weight pt. Pt stepped on/off scale twice with CGA and RW to get weight (RN subtracted weight of crocs and RW). Transferred pt onto portable O2 tank at 2L and pt transferred sit<>stand with RW and supervision and pt ambulated 70ft52f and 40ft 38fwith RW and supervision - required extended rest break. Concluded session with pt sitting in recliner with all needs within reach. Pt left on 1.5L O2 via  with O2 sats 96%  Therapy Documentation Precautions:  Precautions Precautions: Fall Precaution Comments: monitor O2 Restrictions Weight Bearing Restrictions: No RLE Weight Bearing: Partial weight bearing LLE Weight Bearing: Partial weight bearing  Therapy/Group: Individual Therapy Genell Thede MAlfonse AlpersPT   04/11/2021, 7:15 AM

## 2021-04-11 NOTE — Telephone Encounter (Signed)
Candi Leash RN with [facility] called. Nicole Schmidt's home services will start on 3/2 for nursing, 3/3 for physical therapy & 3/6 for occupational therapy. Due to home health staffing.

## 2021-04-11 NOTE — Progress Notes (Signed)
PROGRESS NOTE   Subjective/Complaints: No new complaints this morning Sleepy Appreciate SW discussions with family member No issues reported overnight  ROS: + Lower extremity edema. +right ankle pain  Objective:   No results found. No results for input(s): WBC, HGB, HCT, PLT in the last 72 hours.  No results for input(s): NA, K, CL, CO2, GLUCOSE, BUN, CREATININE, CALCIUM in the last 72 hours.   Intake/Output Summary (Last 24 hours) at 04/11/2021 1024 Last data filed at 04/10/2021 1820 Gross per 24 hour  Intake 472 ml  Output --  Net 472 ml        Physical Exam: Vital Signs Blood pressure (!) 134/56, pulse 77, temperature 98 F (36.7 C), temperature source Oral, resp. rate 18, height '5\' 4"'  (1.626 m), weight (!) 158 kg, last menstrual period 07/17/2012, SpO2 90 %.  General: No acute distress, superobese. Mood and affect are appropriate Heart: Regular rate and rhythm no rubs murmurs or extra sounds Lungs: Clear to auscultation, breathing unlabored, no rales or wheezes Abdomen: Positive bowel sounds, soft nontender to palpation, nondistended Extremities: No clubbing, cyanosis.  Lower extremity edema. Skin: Stasis dermatitis bilateral pre tibial  Neurologic/MSK Alert Right lower extremity: 4-/5 proximal distal Left lower extremity: 3/5 proximal distal  Right ankle tenderness  Assessment/Plan: 1. Functional deficits which require 3+ hours per day of interdisciplinary therapy in a comprehensive inpatient rehab setting. Physiatrist is providing close team supervision and 24 hour management of active medical problems listed below. Physiatrist and rehab team continue to assess barriers to discharge/monitor patient progress toward functional and medical goals  Care Tool:  Bathing    Body parts bathed by patient: Right arm, Left arm, Chest, Abdomen, Front perineal area, Right upper leg, Left upper leg, Face (Patient was  able to simulate bathing UB, but was met with challenges for LB funciton for lower legs and feet.)   Body parts bathed by helper: Right lower leg, Left lower leg, Buttocks (Areas the pt was unable to address)     Bathing assist Assist Level: Maximal Assistance - Patient 24 - 49% (per OT evaluation note) Assistive Device Comment: Long handled sponge, seated bathing (patient would benefit from long handle sponge and is reported as having a raise toilet already in place.)   Upper Body Dressing/Undressing Upper body dressing   What is the patient wearing?: Pull over shirt    Upper body assist Assist Level: Minimal Assistance - Patient > 75%    Lower Body Dressing/Undressing Lower body dressing      What is the patient wearing?: Pants     Lower body assist Assist for lower body dressing: Supervision/Verbal cueing Assistive Device Comment: with reacher   Toileting Toileting Toileting Activity did not occur (Clothing management and hygiene only): Refused  Toileting assist Assist for toileting: Minimal Assistance - Patient > 75%     Transfers Chair/bed transfer  Transfers assist  Chair/bed transfer activity did not occur: Refused (additional assessing needed in this area)  Chair/bed transfer assist level: Supervision/Verbal cueing Chair/bed transfer assistive device: Programmer, multimedia   Ambulation assist   Ambulation activity did not occur: Refused (additional assessing needed in this area)  Assist level:  Supervision/Verbal cueing Assistive device: Walker-rolling Max distance: 25   Walk 10 feet activity   Assist  Walk 10 feet activity did not occur: Safety/medical concerns (weakness, fatigue, deconditioning)  Assist level: Supervision/Verbal cueing Assistive device: Walker-rolling   Walk 50 feet activity   Assist Walk 50 feet with 2 turns activity did not occur: Safety/medical concerns (weakness, fatigue, deconditioning)  Assist level:  Supervision/Verbal cueing Assistive device: Walker-rolling    Walk 150 feet activity   Assist Walk 150 feet activity did not occur: Safety/medical concerns (weakness, fatigue, deconditioning)         Walk 10 feet on uneven surface  activity   Assist Walk 10 feet on uneven surfaces activity did not occur: Safety/medical concerns (weakness, fatigue, deconditioning)   Assist level: Supervision/Verbal cueing Assistive device: Walker-rolling   Wheelchair     Assist Is the patient using a wheelchair?: Yes Type of Wheelchair: Manual Wheelchair activity did not occur: Safety/medical concerns (weakness, fatigue, deconditioning)         Wheelchair 50 feet with 2 turns activity    Assist    Wheelchair 50 feet with 2 turns activity did not occur: Safety/medical concerns (weakness, fatigue, deconditioning)       Wheelchair 150 feet activity     Assist  Wheelchair 150 feet activity did not occur: Safety/medical concerns (weakness, fatigue, deconditioning)       Blood pressure (!) 134/56, pulse 77, temperature 98 F (36.7 C), temperature source Oral, resp. rate 18, height '5\' 4"'  (1.626 m), weight (!) 158 kg, last menstrual period 07/17/2012, SpO2 90 %.    Medical Problem List and Plan: 1. Functional deficits secondary to debility/acute on chronic hypercapnia respiratory failure.  Oxygen therapy as directed/BiPAP  Continue CIR  D/c Wednesday 2.  Antithrombotics: -DVT/anticoagulation:  Pharmaceutical: Other (comment) Eliquis             -antiplatelet therapy: N/A 3. Pain from pressure injury: improved, Neurontin 300 mg nightly, decrease tramadol to q12H prn for moderate pain  Controlled on 2/25 4. Mood: Melatonin as needed             -antipsychotic agents: N/A 5. Neuropsych: This patient is capable of making decisions on her own behalf. 6. Skin/Wound Care: Routine skin checks 7. Fluids/Electrolytes/Nutrition: Routine in and outs  8.  End-stage renal disease  on hemodialysis.  Continue hemodialysis as per renal services.  Permacath placement 02/25/2021.Marland Kitchen Discussed her recent creatinine levels with her 9.  Hypertension: discontinue Norvasc, decrease Toprol XL to 12.5 mg daily.  Monitor with increased mobility. Decrease Tramadol to q8H prn.   Controlled on 2/25 10.  History of atrial fibrillation/pulmonary emboli/acute on chronic CHF.  Continue Eliquis.  Cardiac rate controlled. .Monitor for fluid overload. D/c toprol given hypotension.  Vitals:   04/10/21 2350 04/11/21 0544  BP:  (!) 134/56  Pulse: 93 77  Resp: 18 18  Temp:  98 F (36.7 C)  SpO2:  90%    11.  Hypothyroidism.  Synthroid 12.  Hyperlipidemia.  Lipitor 13.  History of chronic anemia. 14.  Diabetes mellitus.  Latest hemoglobin A1c 7.2.  SSI. Placed nursing order for no fluids other than water.  15.  History of hematuria.  Left ureteral stone with recent ureteroscopy/laser lithotripsy.  Repeat ultrasound showed resolution of left hydronephrosis.  Follow-up Dr.Sninsky. 16.  Morbid obesity.  BMI 62.74-->60.55. Provided dietary education.  17.  Acute blood loss anemia/leukocytosis.    WBCs 10.3 on 2/23, repeat with HD.   Hemoglobin 7.9 on 2/23, continue to  monitor 18.  Urine culture positive ESBL/Proteus/E. coli.  Treat empirically with  one dose fosfomycin 2/9.  Contact precautions. Discussed with her that she will need these until day of discharge.  19.Leukocytosis.  Resolved. discussed that UA is eqiuvocal, UC shows lactobacillus but since leukocytosis has resolved and she is asymptomatic, will not treat at this time. Continue to monitor w/ HD. Follow-up per infectious disease Dr.Ravisankar and presently maintained on IV Invanz x 4 does. 20. Sensation of water in ears:  d/c antibiotic drop, continue debrox bilateral ears daily 21. Right ankle pain related to osteoarthritis, will start Voltaren gel has as needed oxycodone for more severe pain, placed nursing order to ice 15 minutes three  times per day, messaged nurse about this 22. Somnolence: worsened by oxycodone: discussed limiting use for only severe ankle pain.  23. OSA: discussed importance of using CPAP at night.   Compliant 24. Insomnia: asked nursing to place sign on door not to wake before 7am  LOS: 14 days A FACE TO [place] 04/11/2021, 10:24 AM

## 2021-04-11 NOTE — Progress Notes (Signed)
Occupational Therapy Session Note  Patient Details  Name: Nicole Schmidt MRN: [id number] Date of Birth: Mar 29, 1965  Today's Date: 04/11/2021 OT Individual Time: 1300-1329 OT Individual Time Calculation (min): 29 min    Short Term Goals: Week 1:  OT Short Term Goal 1 (Week 1): STG = LTG 2/2 ELOS Week 2:  OT Short Term Goal 1 (Week 2): pt will complete ambulatory toilet transfer with supervision with LRAD to toilet in bathroom OT Short Term Goal 2 (Week 2): Pt will complete LB dressing with AE as needed with no more than min A OT Short Term Goal 3 (Week 2): Pt will complete sit > stand with RW, in prep for ADL with supervision   Skilled Therapeutic Interventions/Progress Updates:    Pt greeted at time of session sitting up in recliner on O2 via [place], sats remained 90% or higher throughout session. Pt initially verbose discussion regarding recent events that led to rehospitalization and breathing difficulties at other hospital. Pt now on BiPAP and having questions regarding getting one at home, passed along to CM/SW. Focused remainder of session on  BUE therex with red level resistance band for 1x15 of the following: shoulder flexion/extension, scapular rows, and horizontal abd/add. Up in chair all needs met call bell in reach.   Therapy Documentation Precautions:  Precautions Precautions: Fall Precaution Comments: monitor O2 Restrictions Weight Bearing Restrictions: No RLE Weight Bearing: Partial weight bearing LLE Weight Bearing: Partial weight bearing     Therapy/Group: Individual Therapy  Viona Gilmore 04/11/2021, 12:58 PM

## 2021-04-12 ENCOUNTER — Other Ambulatory Visit (HOSPITAL_COMMUNITY): Payer: Self-pay

## 2021-04-12 ENCOUNTER — Inpatient Hospital Stay (HOSPITAL_COMMUNITY): Payer: BC Managed Care – PPO

## 2021-04-12 LAB — RENAL FUNCTION PANEL
Albumin: 2.8 g/dL — ABNORMAL LOW (ref 3.5–5.0)
Anion gap: 9 (ref 5–15)
BUN: 59 mg/dL — ABNORMAL HIGH (ref 6–20)
CO2: 26 mmol/L (ref 22–32)
Calcium: 10.1 mg/dL (ref 8.9–10.3)
Chloride: 95 mmol/L — ABNORMAL LOW (ref 98–111)
Creatinine, Ser: 5.98 mg/dL — ABNORMAL HIGH (ref 0.44–1.00)
GFR, Estimated: 8 mL/min — ABNORMAL LOW (ref >60)
Glucose, Bld: 162 mg/dL — ABNORMAL HIGH (ref 70–99)
Phosphorus: 5.4 mg/dL — ABNORMAL HIGH (ref 2.5–4.6)
Potassium: 4.3 mmol/L (ref 3.5–5.1)
Sodium: 130 mmol/L — ABNORMAL LOW (ref 135–145)

## 2021-04-12 LAB — PULMONARY FUNCTION TEST
FEF 25-75 Pre: 1.11 L/sec
FEF2575-%Pred-Pre: 44 %
FEV1-%Pred-Pre: 42 %
FEV1-Pre: 1.11 L
FEV1FVC-%Pred-Pre: 104 %
FEV6-%Pred-Pre: 41 %
FEV6-Pre: 1.34 L
FEV6FVC-%Pred-Pre: 102 %
FVC-%Pred-Pre: 39 %
FVC-Pre: 1.35 L
Pre FEV1/FVC ratio: 82 %
Pre FEV6/FVC Ratio: 99 %

## 2021-04-12 LAB — CBC
HCT: 29.7 % — ABNORMAL LOW (ref 36.0–46.0)
Hemoglobin: 8.9 g/dL — ABNORMAL LOW (ref 12.0–15.0)
MCH: 29 pg (ref 26.0–34.0)
MCHC: 30 g/dL (ref 30.0–36.0)
MCV: 96.7 fL (ref 80.0–100.0)
Platelets: 275 10*3/uL (ref 150–400)
RBC: 3.07 MIL/uL — ABNORMAL LOW (ref 3.87–5.11)
RDW: 18 % — ABNORMAL HIGH (ref 11.5–15.5)
WBC: 11 10*3/uL — ABNORMAL HIGH (ref 4.0–10.5)
nRBC: 0 % (ref 0.0–0.2)

## 2021-04-12 LAB — GLUCOSE, CAPILLARY
Glucose-Capillary: 95 mg/dL (ref 70–99)
Glucose-Capillary: 106 mg/dL — ABNORMAL HIGH (ref 70–99)
Glucose-Capillary: 151 mg/dL — ABNORMAL HIGH (ref 70–99)

## 2021-04-12 MED ORDER — SODIUM CHLORIDE 0.9 % IV SOLN: 250.0000 mg | INTRAVENOUS | Status: AC

## 2021-04-12 MED ORDER — TRAMADOL HCL 50 MG PO TABS
50.0000 mg | ORAL_TABLET | Freq: Two times a day (BID) | ORAL | 0 refills | Status: DC | PRN
  Filled 2021-04-12: qty 30, 15d supply, fill #0

## 2021-04-12 MED ORDER — LEVOTHYROXINE SODIUM 112 MCG PO TABS
112.0000 ug | ORAL_TABLET | Freq: Every day | ORAL | 0 refills | Status: AC
  Filled 2021-04-12: qty 30, 30d supply, fill #0

## 2021-04-12 MED ORDER — FOLIC ACID 1 MG PO TABS
1000.0000 ug | ORAL_TABLET | Freq: Every day | ORAL | 0 refills | Status: DC
  Filled 2021-04-12: qty 30, 30d supply, fill #0

## 2021-04-12 MED ORDER — ATORVASTATIN CALCIUM 20 MG PO TABS
20.0000 mg | ORAL_TABLET | Freq: Every day | ORAL | 0 refills | Status: DC
Start: 2021-04-12 — End: 2021-04-27
  Filled 2021-04-12: qty 30, 30d supply, fill #0

## 2021-04-12 MED ORDER — GABAPENTIN 300 MG PO CAPS
300.0000 mg | ORAL_CAPSULE | Freq: Every day | ORAL | 0 refills | Status: AC
  Filled 2021-04-12: qty 30, 30d supply, fill #0

## 2021-04-12 MED ORDER — CALCITRIOL 0.25 MCG PO CAPS
0.2500 ug | ORAL_CAPSULE | Freq: Every day | ORAL | 0 refills | Status: DC
Start: 2021-04-12 — End: 2021-10-21
  Filled 2021-04-12: qty 30, 30d supply, fill #0

## 2021-04-12 MED ORDER — DICLOFENAC SODIUM 1 % EX GEL
2.0000 g | Freq: Four times a day (QID) | CUTANEOUS | 0 refills | Status: DC
  Filled 2021-04-12: qty 100, 7d supply, fill #0

## 2021-04-12 MED ORDER — HEPARIN SODIUM (PORCINE) 1000 UNIT/ML IJ SOLN
INTRAMUSCULAR | Status: AC
  Administered 2021-04-14: 2000 [IU] via INTRAVENOUS_CENTRAL
  Filled 2021-04-12: qty 5

## 2021-04-12 MED ORDER — FAMOTIDINE 10 MG PO TABS
10.0000 mg | ORAL_TABLET | Freq: Every day | ORAL | 0 refills | Status: DC
  Filled 2021-04-12: qty 30, 30d supply, fill #0

## 2021-04-12 MED ORDER — MELATONIN 5 MG PO TABS
10.0000 mg | ORAL_TABLET | Freq: Every evening | ORAL | 0 refills | Status: AC | PRN
  Filled 2021-04-12: qty 60, 30d supply, fill #0

## 2021-04-12 MED ORDER — ERGOCALCIFEROL 1.25 MG (50000 UT) PO CAPS
50000.0000 [IU] | ORAL_CAPSULE | ORAL | 0 refills | Status: DC
Start: 2021-04-12 — End: 2023-05-25
  Filled 2021-04-12: qty 4, 28d supply, fill #0

## 2021-04-12 MED ORDER — MIDODRINE HCL 10 MG PO TABS
10.0000 mg | ORAL_TABLET | ORAL | 0 refills | Status: AC
  Filled 2021-04-12: qty 12, 28d supply, fill #0

## 2021-04-12 MED ORDER — ALBUTEROL SULFATE HFA 108 (90 BASE) MCG/ACT IN AERS
2.0000 | INHALATION_SPRAY | Freq: Four times a day (QID) | RESPIRATORY_TRACT | 0 refills | Status: DC | PRN
  Filled 2021-04-12: qty 8.5, 25d supply, fill #0

## 2021-04-12 MED ORDER — SEVELAMER CARBONATE 800 MG PO TABS
800.0000 mg | ORAL_TABLET | Freq: Three times a day (TID) | ORAL | 0 refills | Status: DC
  Filled 2021-04-12: qty 90, 30d supply, fill #0

## 2021-04-12 MED ORDER — APIXABAN 5 MG PO TABS
5.0000 mg | ORAL_TABLET | Freq: Two times a day (BID) | ORAL | 0 refills | Status: DC
  Filled 2021-04-12: qty 60, 30d supply, fill #0

## 2021-04-12 MED ORDER — CALCIUM CARBONATE 1250 (500 CA) MG PO TABS: 1250.0000 mg | ORAL_TABLET | Freq: Every day | ORAL | Status: DC

## 2021-04-12 NOTE — Progress Notes (Signed)
Physical Therapy Discharge Summary  Patient Details  Name: Nicole Schmidt MRN: [id number] Date of Birth: 1965/03/28  Today's Date: 04/12/2021 PT Individual Time: 0900-0954 PT Individual Time Calculation (min): 54 min   Patient has met 6 of 8 long term goals due to improved activity tolerance, improved balance, improved postural control, increased strength, and improved coordination. Patient to discharge at an ambulatory level Modified Independent.   Reasons goals not met: Pt did not meet bed mobility goal of mod I as pt currently requires min A for BLE management for sit<>supine due to weakness and body habitus; however pt will be sleeping in lift chair at home. Pt also did not meet stair goal of 4 steps with 2 rails and supervision as pt currently requires CGA to navigate 8 3in steps due to weakness and decreased balance; however pt will have ramp installed upon discharge.   Recommendation:  Patient will benefit from ongoing skilled PT services in home health setting to continue to advance safe functional mobility, address ongoing impairments in generalized strengthen and endurance, dynamic standing balance/coordination, gait training, and to minimize fall risk.  Equipment: No equipment provided; has bari-RW at home  Reasons for discharge: treatment goals met  Patient/family agrees with progress made and goals achieved: Yes  Today's Interventions: Received pt supine in bed asleep with CPAP machine on and RN present administering medications. Pt agreeable to PT treatment and denied any pain during session. Pt with questions regarding getting a Bipap for D/C tomorrow - secure chatted MD and CSW. Pt on 1.5L O2 via [place] with sats 96% - transitioned to 2L on portable O2 tank with ambulation. Session with emphasis on discharge planning, functional mobility, generalized strengthening and endurance, dynamic standing balance/coordination, and gait training. Pt transferred semi-reclined<>sitting EOB with  HOB elevated and use of bedrails mod I with increased time. Donned crocs with max A and pt stood with RW mod I. Pt ambulated 175f with RW mod I to dayroom with total A to manage O2 tank - 1 standing rest break. Worked on blocked practice sit<>stands without AD and distant supervision x 20 reps to fatigue - pt did require use of BUEs to push up from mat. Sit<>stand with RW mod I and ambulated additional 1870fwith RW mod I back to room - 2 standing rest breaks. Pt reported feeling nervous about going home tomorrow, particularly about falling asleep without CPAP machine one - reinforced importance of putting on CPAP when she feels herself getting sleepy. Concluded session with pt sitting in recliner with all needs within reach awaiting upcoming OT session.   PT Discharge Precautions/Restrictions Precautions Precautions: Fall Precaution Comments: monitor O2 Restrictions Weight Bearing Restrictions: No Pain Interference Pain Interference Pain Effect on Sleep: 0. Does not apply - I have not had any pain or hurting in the past 5 days Pain Interference with Therapy Activities: 0. Does not apply - I have not received rehabilitationtherapy in the past 5 days Pain Interference with Day-to-Day Activities: 1. Rarely or not at all Cognition Overall Cognitive Status: Within Functional Limits for tasks assessed Arousal/Alertness: Awake/alert Orientation Level: Oriented X4 Year: 2023 Month: February Day of Week: Correct Attention: Focused Focused Attention: Appears intact Memory: Impaired Immediate Memory Recall: Sock;Blue;Bed Memory Recall Sock: Without Cue Memory Recall Blue: Without Cue Memory Recall Bed: Without Cue Awareness: Appears intact Problem Solving: Appears intact Safety/Judgment: Appears intact Sensation Sensation Light Touch: Appears Intact Hot/Cold: Appears Intact Proprioception: Appears Intact Stereognosis: Not tested Coordination Gross Motor Movements are Fluid and  Coordinated: Yes Fine Motor Movements are Fluid and Coordinated: Yes Coordination and Movement Description: generalized weakness/deconditioning Finger Nose Finger Test: WFL bilaterally but slow Heel Shin Test: decreased ROM bilaterally due to body habitus Motor  Motor Motor: Within Functional Limits Motor - Skilled Clinical Observations: generalized weakness/deconditioning  Mobility Bed Mobility Bed Mobility: Rolling Right;Rolling Left;Sit to Supine;Supine to Sit Rolling Right: Independent with assistive device Rolling Left: Independent with assistive device Supine to Sit: Independent with assistive device Sit to Supine: Minimal Assistance - Patient > 75% Transfers Transfers: Sit to Stand;Stand to Sit;Stand Pivot Transfers Sit to Stand: Independent with assistive device Stand to Sit: Independent with assistive device Stand Pivot Transfers: Independent with assistive device Transfer (Assistive device): Rolling walker Locomotion  Gait Ambulation: Yes Gait Assistance: Independent with assistive device Gait Distance (Feet): 180 Feet Assistive device: Rolling walker Gait Gait: Yes Gait Pattern: Impaired Gait Pattern: Decreased step length - right;Decreased step length - left;Decreased stride length;Poor foot clearance - left;Poor foot clearance - right;Wide base of support;Step-through pattern Gait velocity: decreased Stairs / Additional Locomotion Stairs: Yes Stairs Assistance: Contact Guard/Touching assist Stair Management Technique: Two rails Number of Stairs: 8 Height of Stairs: 3 Ramp: Supervision/Verbal cueing (RW) Pick up small object from the floor assist level: Dependent - Patient 0% Wheelchair Mobility Wheelchair Mobility: No  Trunk/Postural Assessment  Cervical Assessment Cervical Assessment: Within Functional Limits Thoracic Assessment Thoracic Assessment: Exceptions to [facility] (mild kyphosis) Lumbar Assessment Lumbar Assessment: Exceptions to [facility] (anterior pelvic  tilt due to body habitus) Postural Control Postural Control: Within Functional Limits  Balance Balance Balance Assessed: Yes Static Sitting Balance Static Sitting - Balance Support: Feet supported;Bilateral upper extremity supported Static Sitting - Level of Assistance: 6: Modified independent (Device/Increase time) Dynamic Sitting Balance Dynamic Sitting - Balance Support: Feet supported;Bilateral upper extremity supported Dynamic Sitting - Level of Assistance: 6: Modified independent (Device/Increase time) Static Standing Balance Static Standing - Balance Support: Bilateral upper extremity supported (RW) Static Standing - Level of Assistance: 6: Modified independent (Device/Increase time) Dynamic Standing Balance Dynamic Standing - Balance Support: Bilateral upper extremity supported (RW) Dynamic Standing - Level of Assistance: 6: Modified independent (Device/Increase time) Dynamic Standing - Comments: with transfers and gait Extremity Assessment  RLE Assessment RLE Assessment: Exceptions to [facility] Active Range of Motion (AROM) Comments: limited due to body habitus General Strength Comments: grossly generalized to 4-/5 LLE Assessment LLE Assessment: Exceptions to [facility] Active Range of Motion (AROM) Comments: limited due to body habitus General Strength Comments: grossly generalized to [place] PT, DPT  04/12/2021, 7:49 AM

## 2021-04-12 NOTE — Progress Notes (Signed)
[facility] Progress Note   Summary:  56 yo w/ hx breast cancer, anemia, CKD, DM2, HTN, gout, obesity, hx PE, afib on eliquis, ESRD recent start to HD on 02/25/21 at [facility]. Pt had repeat admits for a/c resp failure/ CHF and progression of CKD to ESRD needing dialysis. Last admit at [facility] on 03/17/21 for a/c CHF, resp failure and L ureteral stone sp stent placement and rx'd ESBL UTI w/ abx. Pt debilitated after all of this and was tx'd to [facility] for rehab on 03/28/21.    Assessment/ Plan:   Assessment/Plan:  Debility - following prolonged hospitalizations.  In CIR.   Chronic respiratory failure/hypercapnic respiratory failure - on 2L O2 prior to last admission, uses bipap overnight.  Pt is very concerned re: getting a BiPaP at home- have asked her to get in contact with case management and her primary.    Acute resp failure - on 2/19 had emergent HD for resp distress. Resolved.  ESRD -  on HD TTS. Next HD 2/28.Marland Kitchen   Hypertension - BP's good today. OK to cont toprol xl 12.5 qd for afib. Getting midodrine 10 mg pre HD TTS.  Volume overload - persistent problem since recent start of HD in Jan. Wt's continue to drops still has sig LE edema. Cont to lower vol as tolerated.   Anemia of CKD - Hgb 8- 9 range here. SP Aranesp 100ug on 2/15, next dose today 2/23 then weekly on thursdays. Fe load started 2/21.   Secondary Hyperparathyroidism -  Calcium and phos in goal.  Continue VDRA.  renal diet, on revela. Cal trending up, consider holding os-cal  Recent ESBL UTI/Hematuria/L ureteral stone-  sp stent placement on 02/14/21. Repeat US showed resolution of hydronephrosis.  F/u urology at OP Nutrition - Renal diet w/fluid restrictions.  Hx A fib - on eliquis, toprol xl 12.5 qd. DMT2 - per PMD Access: RIJ TDC. Patient wishes to have her AVF/AVG done as an outpatient/post discharge which is reasonable Dispo: EDD 3/1  [name], MD 04/12/2021, 1:00 PM    OP HD: [facility] TTS [address]    4h  400/600  166.5kg  Hep 2000 + 200u/hr  RIJ permcath   - Hep B SAg neg here on 2/14, Covid neg 2/05  - had only 1 outpt Rx on 03/17/21  - no meds started   Subjective:    Seen in room.  Nervous about going home- wants to make sure she has a BiPaP at home.     Objective:   BP 136/70 (BP Location: Left Arm)    Pulse 75    Temp 97.9 F (36.6 C) (Oral)    Resp 18    Ht 5\' 4"  (1.626 m)    Wt (!) 155 kg    LMP 07/17/2012    SpO2 94%    BMI 58.65 kg/m   Intake/Output Summary (Last 24 hours) at 04/12/2021 1300 Last data filed at 04/12/2021 0263 Gross per 24 hour  Intake 596 ml  Output 2 ml  Net 594 ml   Weight change: -3 kg  Physical Exam: Gen: alert CVS:rrr, s1s2 Resp: clear ZCH:YIFOY, soft Ext: diffuse 1-2+  pitting edema b/l Les Neuro: alert, nonfocal Dialysis access: RIJ TDC c/d/i  Imaging: No results found.  Labs: BMET Recent Labs  Lab 04/05/21 1339 04/07/21 1426  NA 126* 128*  K 5.1 4.4  CL 91* 92*  CO2 26 26  GLUCOSE 117* 122*  BUN 71* 56*  CREATININE 6.26*  5.29*  CALCIUM 10.0 9.1  PHOS 6.8* 4.8*   CBC Recent Labs  Lab 04/05/21 1355 04/06/21 1324 04/07/21 1425  WBC 14.3* 11.1* 10.3  NEUTROABS  --  7.9*  --   HGB 8.1* 8.7* 7.9*  HCT 29.1* 30.0* 26.3*  MCV 97.7 95.5 94.9  PLT 318 324 301    Medications:     apixaban  5 mg Oral BID   atorvastatin  20 mg Oral QHS   bisacodyl  10 mg Rectal Daily   calcitRIOL  0.25 mcg Oral Daily   calcium carbonate  1,250 mg Oral Q breakfast   carbamide peroxide  5 drop Both EARS Daily   chlorhexidine  15 mL Mouth Rinse BID   Chlorhexidine Gluconate Cloth  6 each Topical Q0600   darbepoetin (ARANESP) injection - DIALYSIS  100 mcg Intravenous Q Thu-HD   diclofenac Sodium  2 g Topical QID   famotidine  10 mg Oral Daily   feeding supplement (NEPRO CARB STEADY)  237 mL Oral BID BM   folic acid  1,791 mcg Oral Daily   gabapentin  300 mg Oral QHS   Gerhardt's butt cream  3 application Topical TID   insulin aspart   0-15 Units Subcutaneous TID WC   levothyroxine  112 mcg Oral QAC breakfast   mouth rinse  15 mL Mouth Rinse q12n4p   midodrine  10 mg Oral Q T,Th,Sa-HD   multivitamin  1 tablet Oral QHS   sevelamer carbonate  800 mg Oral TID WC   Vitamin D (Ergocalciferol)  50,000 Units Oral Weekly

## 2021-04-12 NOTE — Progress Notes (Signed)
Occupational Therapy Session Note  Patient Details  Name: Nicole Schmidt MRN: [id number] Date of Birth: [birth year]  Today's Date: 04/12/2021 OT Individual Time: 1100-1155 OT Individual Time Calculation (min): 55 min    Short Term Goals: Week 2:  OT Short Term Goal 1 (Week 2): pt will complete ambulatory toilet transfer with supervision with LRAD to toilet in bathroom OT Short Term Goal 2 (Week 2): Pt will complete LB dressing with AE as needed with no more than min A OT Short Term Goal 3 (Week 2): Pt will complete sit > stand with RW, in prep for ADL with supervision  Skilled Therapeutic Interventions/Progress Updates:  Skilled OT intervention completed with focus on self-care, d/c planning, functional/ambulatory endurance. Pt received seated in recliner, requesting to wash her hair. Pt completed all sit > stands without RW with mod I, then in room ambulation from recliner <> w/c with mod I using RW throughout session. Seated at sink, therapist used hair washing tray to wash pt's hair with total A, with education regarding methods of doing this at home provided. Pt participated in hair drying for functional endurance with mod I. Education regarding energy conservation strategies provided for in home and self-care tasks upon d/c. Pt requesting nepro, with therapist providing per nurse approval, and NT in room at pt's side with this therapist handing of pt care. Pt was left seated in recliner, with chair alarm on and all needs in reach at end of session.   O2 sats as follows on 2L: At start of session at rest, 90% After ambulation in room, 88% While drying hair seated, 78%, rebounded to >90% with pursed lip breathing (Notified nurse of pt's nasal stuffiness which this therapist thinks contributed to lower SPO2 this session as well as per pt report)   Therapy Documentation Precautions:  Precautions Precautions: Fall Precaution Comments: monitor O2 Restrictions Weight Bearing Restrictions:  No   Pain: No c/o pain   Therapy/Group: Individual Therapy  Riyad Keena E Toussaint Golson 04/12/2021, 7:50 AM

## 2021-04-12 NOTE — Progress Notes (Addendum)
PROGRESS NOTE   Subjective/Complaints: No new complaints this morning She was worried about the BiPAP not being brought yet- messaged Christiina to see when this would arrive  ROS: + Lower extremity edema, right ankle pain  Objective:   No results found. Recent Labs    04/12/21 1409  WBC 11.0*  HGB 8.9*  HCT 29.7*  PLT 275    Recent Labs    04/12/21 1409  NA 130*  K 4.3  CL 95*  CO2 26  GLUCOSE 162*  BUN 59*  CREATININE 5.98*  CALCIUM 10.1     Intake/Output Summary (Last 24 hours) at 04/12/2021 1534 Last data filed at 04/12/2021 0955 Gross per 24 hour  Intake 360 ml  Output 2 ml  Net 358 ml        Physical Exam: Vital Signs Blood pressure (!) 157/61, pulse 89, temperature 97.7 F (36.5 C), temperature source Oral, resp. rate 19, height 5\' 4"  (1.626 m), weight (!) 160.5 kg, last menstrual period 07/17/2012, SpO2 97 %.  General: No acute distress, superobese. BMI 60.74.  Mood and affect are appropriate Heart: Regular rate and rhythm no rubs murmurs or extra sounds Lungs: Clear to auscultation, breathing unlabored, no rales or wheezes Abdomen: Positive bowel sounds, soft nontender to palpation, nondistended Extremities: No clubbing, cyanosis.  Lower extremity edema. Skin: Stasis dermatitis bilateral pre tibial  Neurologic: Alert Right lower extremity: 4-/5 proximal distal Left lower extremity: 3/5 proximal distal   Assessment/Plan: 1. Functional deficits which require 3+ hours per day of interdisciplinary therapy in a comprehensive inpatient rehab setting. Physiatrist is providing close team supervision and 24 hour management of active medical problems listed below. Physiatrist and rehab team continue to assess barriers to discharge/monitor patient progress toward functional and medical goals  Care Tool:  Bathing    Body parts bathed by patient: Right arm, Left arm, Chest, Abdomen, Front perineal  area, Right upper leg, Left upper leg, Face, Buttocks, Right lower leg, Left lower leg   Body parts bathed by helper: Right lower leg, Left lower leg, Buttocks (Areas the pt was unable to address)     Bathing assist Assist Level: Independent with assistive device Assistive Device Comment: Long handled sponge, seated bathing   Upper Body Dressing/Undressing Upper body dressing   What is the patient wearing?: Pull over shirt    Upper body assist Assist Level: Independent    Lower Body Dressing/Undressing Lower body dressing      What is the patient wearing?: Pants     Lower body assist Assist for lower body dressing: Independent with assitive device Assistive Device Comment: with reacher   Toileting Toileting Toileting Activity did not occur (Clothing management and hygiene only): Refused  Toileting assist Assist for toileting: Independent with assistive device     Transfers Chair/bed transfer  Transfers assist  Chair/bed transfer activity did not occur: Refused (additional assessing needed in this area)  Chair/bed transfer assist level: Independent with assistive device Chair/bed transfer assistive device: Programmer, multimedia   Ambulation assist   Ambulation activity did not occur: Refused (additional assessing needed in this area)  Assist level: Independent with assistive device Assistive device: Walker-rolling Max distance:  166ft   Walk 10 feet activity   Assist  Walk 10 feet activity did not occur: Safety/medical concerns (weakness, fatigue, deconditioning)  Assist level: Independent with assistive device Assistive device: Walker-rolling   Walk 50 feet activity   Assist Walk 50 feet with 2 turns activity did not occur: Safety/medical concerns (weakness, fatigue, deconditioning)  Assist level: Independent with assistive device Assistive device: Walker-rolling    Walk 150 feet activity   Assist Walk 150 feet activity did not occur:  Safety/medical concerns (weakness, fatigue, deconditioning)  Assist level: Independent with assistive device Assistive device: Walker-rolling    Walk 10 feet on uneven surface  activity   Assist Walk 10 feet on uneven surfaces activity did not occur: Safety/medical concerns (weakness, fatigue, deconditioning)   Assist level: Supervision/Verbal cueing Assistive device: Walker-rolling   Wheelchair     Assist Is the patient using a wheelchair?: Yes Type of Wheelchair: Manual Wheelchair activity did not occur: Safety/medical concerns (weakness, fatigue, deconditioning)  Wheelchair assist level: Dependent - Patient 0%      Wheelchair 50 feet with 2 turns activity    Assist    Wheelchair 50 feet with 2 turns activity did not occur: Safety/medical concerns (weakness, fatigue, deconditioning)   Assist Level: Dependent - Patient 0%   Wheelchair 150 feet activity     Assist  Wheelchair 150 feet activity did not occur: Safety/medical concerns (weakness, fatigue, deconditioning)   Assist Level: Dependent - Patient 0%   Blood pressure (!) 157/61, pulse 89, temperature 97.7 F (36.5 C), temperature source Oral, resp. rate 19, height 5\' 4"  (1.626 m), weight (!) 160.5 kg, last menstrual period 07/17/2012, SpO2 97 %.    Medical Problem List and Plan: 1. Functional deficits secondary to debility/acute on chronic hypercapnia respiratory failure.  Oxygen therapy as directed/BiPAP  Continue CIR  RT eval for spirometry ordered.  2.  Antithrombotics: -DVT/anticoagulation:  Pharmaceutical: Other (comment) Eliquis             -antiplatelet therapy: N/A 3. Pain from pressure injury: improved, Neurontin 300 mg nightly, decrease tramadol to q12H prn for moderate pain  Controlled on 2/25 4. Mood: Melatonin as needed             -antipsychotic agents: N/A 5. Neuropsych: This patient is capable of making decisions on her own behalf. 6. Skin/Wound Care: Routine skin checks 7.  Fluids/Electrolytes/Nutrition: Routine in and outs  8.  End-stage renal disease on hemodialysis.  Continue hemodialysis as per renal services.  Permacath placement 02/25/2021.Marland Kitchen Discussed her recent creatinine levels with her 9.  Hypertension: Norvasc 5 mg daily, decrease Toprol XL to 12.5 mg daily.  Monitor with increased mobility. Decrease Tramadol to q8H prn.   Controlled on 2/25 10.  History of atrial fibrillation/pulmonary emboli/acute on chronic CHF.  Continue Eliquis.  Cardiac rate controlled. continue beta-blocker.Monitor for fluid overload Vitals:   04/12/21 1430 04/12/21 1500  BP: (!) 142/52 (!) 157/61  Pulse:    Resp: 19 19  Temp:    SpO2:      11.  Hypothyroidism.  continue Synthroid 12.  Hyperlipidemia.  continue Lipitor 13.  History of chronic anemia. 14.  Diabetes mellitus.  Latest hemoglobin A1c 7.2.  SSI. Placed nursing order for no fluids other than water.  15.  History of hematuria.  Left ureteral stone with recent ureteroscopy/laser lithotripsy.  Repeat ultrasound showed resolution of left hydronephrosis.  Follow-up Dr.Sninsky. 16.  Morbid obesity.  BMI 62.74-->60.55. Provided dietary education.  17.  Acute blood loss anemia/leukocytosis.  WBCs 10.3 on 2/23  Hemoglobin 7.9 on 2/23, continue to monitor 18.  Urine culture positive ESBL/Proteus/E. coli.  Treat empirically with  one dose fosfomycin 2/9.  Contact precautions. Discussed with her that she will need these until day of discharge.  19.Leukocytosis.  Resolved. discussed that UA is eqiuvocal, f/u UC. continue to monitor w/ HD. Follow-up per infectious disease Dr.Ravisankar and presently maintained on IV Invanz x 4 does. 20. Sensation of water in ears:  d/c antibiotic drop, continue debrox bilateral ears daily 21. Right ankle pain related to osteoarthritis, will start Voltaren gel has as needed oxycodone for more severe pain, placed nursing order to ice 15 minutes three times per day, messaged nurse about this 22.  Somnolence: worsened by oxycodone: discussed limiting use for only severe ankle pain.  23. OSA: discussed importance of using CPAP at night.   Compliant 24. Insomnia: asked nursing to place sign on door not to wake before 7am 25. Superobese BMI 60.74: provided dietary education. patient's morbid obesity/OHS causes thoracic restrictive   LOS: 15 days A FACE TO FACE EVALUATION WAS PERFORMED  Nicole Schmidt 04/12/2021, 3:34 PM

## 2021-04-12 NOTE — Progress Notes (Signed)
Occupational Therapy Session Note  Patient Details  Name: Nicole Schmidt MRN: [id number] Date of Birth: 12-07-1965  Today's Date: 04/12/2021 OT Individual Time: 0705-0800 OT Individual Time Calculation (min): 55 min    Short Term Goals: Week 1:  OT Short Term Goal 1 (Week 1): STG = LTG 2/2 ELOS Week 2:  OT Short Term Goal 1 (Week 2): pt will complete ambulatory toilet transfer with supervision with LRAD to toilet in bathroom OT Short Term Goal 2 (Week 2): Pt will complete LB dressing with AE as needed with no more than min A OT Short Term Goal 3 (Week 2): Pt will complete sit > stand with RW, in prep for ADL with supervision  Skilled Therapeutic Interventions/Progress Updates:  Patient met asleep in bed in agreement with OT treatment session. Easily awoken. 0/10 pain reported at rest and with activity. Patient reports desire to complete breakfast meal prior to further participation with ADLs. Supine to EOB with Min A at trunk after multiple attempts to come upright without external assist. Seated EOB patient completed meal. Discussion about d/c planning, follow-up OT recommendations, home DME and energy conservation during meal. Patient initially declined bathing/dressing at sink level but after explanation of need to complete bathing for d/c purposes, patient in agreement. Unable to shower 2/2 HD port but able to perform UB/LB bathing at sink level with Mod I, use of AE and more than increased time. Patient declined dressing reporting desire to remain in hospital gown (gown not doffed for bathing per patient request). Patient also declined changing into clean gown after bathing. 3/3 parts of toileting task completed with Mod I. Patient demonstrates good carryover of safe hand placement with all functional transfers this date. Return to supine at conclusion of session with supervision A for safety. Call bell within reach, bed alarm activated and all needs met.   Therapy Documentation Precautions:   Precautions Precautions: Fall Precaution Comments: monitor O2 Restrictions Weight Bearing Restrictions: No RLE Weight Bearing: Partial weight bearing LLE Weight Bearing: Partial weight bearing General:   Therapy/Group: Individual Therapy  Tkeyah Burkman R Howerton-Davis 04/12/2021, 6:51 AM

## 2021-04-12 NOTE — Progress Notes (Signed)
Inpatient Rehabilitation Discharge Medication Review by a Pharmacist  A complete drug regimen review was completed for this patient to identify any potential clinically significant medication issues.  High Risk Drug Classes Is patient taking? 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    Other       Clinically significant medication issues were identified that warrant physician communication and completion of prescribed/recommended actions by midnight of the next day:  No  Time spent performing this drug regimen review (minutes):  30   Jullia Mulligan BS, PharmD, BCPS Clinical Pharmacist 04/12/2021 10:08 AM

## 2021-04-12 NOTE — Progress Notes (Signed)
Inpatient Rehabilitation Care Coordinator ?Discharge Note  ? ?Patient Details  ?Name: Nicole Schmidt ?MRN: [id number] ?Date of Birth: 1965/02/26 ? ? ?Discharge location: Home ? ?Length of Stay: 16 Days ? ?Discharge activity level: MOD I ? ?Home/community participation: Intermittent A from family [employer]) ? ?Patient response UV:JDYNXG Literacy - How often do you need to have someone help you when you read instructions, pamphlets, or other written material from your doctor or pharmacy?: Rarely ? ?Patient response ZF:POIPPG Isolation - How often do you feel lonely or isolated from those around you?: Never ? ?Services provided included: SW, Neuropsych, Pharmacy, TR, CM, RN, SLP, OT, PT, RD, MD ? ?Financial Services:  ?Charity fundraiser Utilized: Private Insurance ?BCBS COMM ? ?Choices offered to/list presented to: pt ? ?Follow-up services arranged:  ?Home Health ?Home Health Agency: [facility]  ?  ?  ?  ? ?Patient response to transportation need: ?Is the patient able to respond to transportation needs?: Yes ?In the past 12 months, has lack of transportation kept you from medical appointments or from getting medications?: No ?In the past 12 months, has lack of transportation kept you from meetings, work, or from getting things needed for daily living?: No ? ? ? ?Comments (or additional information): ? ?Patient/Family verbalized understanding of follow-up arrangements:  Yes ? ?Individual responsible for coordination of the follow-up plan: self / Manuela Schwartz [phone] ? ?Confirmed correct DME delivered: Dyanne Iha 04/12/2021   ? ?Dyanne Iha ?

## 2021-04-12 NOTE — Progress Notes (Signed)
Pt c/o rectum pressure and constipation sensation. Pt requesting the ordered suppository. Charge Rn Marne notified and administered suppository. Pt noted to have small amount of blood from rectum. Pt had BM at 2315

## 2021-04-12 NOTE — Progress Notes (Signed)
Occupational Therapy Discharge Summary  Patient Details  Name: Nicole Schmidt MRN: [id number] Date of Birth: 1965/06/19   Patient has met 8 of 11 long term goals due to improved activity tolerance, improved balance, ability to compensate for deficits, improved attention, improved awareness, and improved coordination.  Patient to discharge at overall Modified Independent level for self-care and functional transfers.   Reasons goals not met: meal prep/house keeping goals not met due to pt's decreased endurance level, with pt not agreeable to go into kitchen for education. Pt declined practicing shower transfer when attempted therefore did not meet  Recommendation:  Patient will benefit from ongoing skilled OT services in home health setting to continue to advance functional skills in the area of BADL and iADL.  Equipment: Pt has bariatric BSC and TTB available at home  Reasons for discharge: treatment goals met  Patient/family agrees with progress made and goals achieved: Yes  OT Discharge Precautions/Restrictions  Precautions Precautions: Fall Precaution Comments: monitor O2 Restrictions Weight Bearing Restrictions: No ADL ADL Equipment Provided: Long-handled sponge, Reacher Eating: Independent Where Assessed-Eating: Chair Grooming: Modified independent Where Assessed-Grooming: Standing at sink Upper Body Bathing: Independent Where Assessed-Upper Body Bathing: Sitting at sink Lower Body Bathing: Modified independent Where Assessed-Lower Body Bathing: Sitting at sink Upper Body Dressing: Modified independent (Device) Where Assessed-Upper Body Dressing: Sitting at sink Lower Body Dressing: Modified independent Where Assessed-Lower Body Dressing: Sitting at sink Toileting: Modified independent Where Assessed-Toileting: Glass blower/designer: Diplomatic Services operational officer Method: Counselling psychologist: Energy manager: Not  assessed Social research officer, government: Not assessed Vision Baseline Vision/History: 1 Wears glasses (readers) Patient Visual Report: No change from baseline Vision Assessment?: No apparent visual deficits Perception  Perception: Within Functional Limits Praxis Praxis: Intact Cognition Overall Cognitive Status: Within Functional Limits for tasks assessed Arousal/Alertness: Awake/alert Orientation Level: Oriented X4 Year: 2023 Month: February Day of Week: Correct Attention: Focused Focused Attention: Appears intact Memory: Impaired Immediate Memory Recall: Sock;Blue;Bed Memory Recall Sock: Without Cue Memory Recall Blue: Without Cue Memory Recall Bed: Without Cue Awareness: Appears intact Problem Solving: Appears intact Safety/Judgment: Appears intact Sensation Sensation Light Touch: Appears Intact Hot/Cold: Appears Intact Proprioception: Appears Intact Stereognosis: Not tested Coordination Gross Motor Movements are Fluid and Coordinated: Yes Fine Motor Movements are Fluid and Coordinated: Yes Finger Nose Finger Test: WFL bilaterally but slow Motor  Motor Motor: Within Functional Limits Motor - Skilled Clinical Observations: generalized weakness/deconditioning Mobility  Bed Mobility Bed Mobility: Rolling Right;Rolling Left;Sit to Supine;Supine to Sit Rolling Right: Independent with assistive device Rolling Left: Independent with assistive device Supine to Sit: Independent with assistive device Sit to Supine: Minimal Assistance - Patient > 75% Transfers Sit to Stand: Independent with assistive device Stand to Sit: Independent with assistive device  Trunk/Postural Assessment  Cervical Assessment Cervical Assessment: Within Functional Limits Thoracic Assessment Thoracic Assessment: Exceptions to [facility] (mild kyphosis) Lumbar Assessment Lumbar Assessment: Exceptions to [facility] (anterior pelvic tilt due to body habitus) Postural Control Postural Control: Within Functional  Limits  Balance Balance Balance Assessed: Yes Static Sitting Balance Static Sitting - Balance Support: Feet supported;Bilateral upper extremity supported Static Sitting - Level of Assistance: 6: Modified independent (Device/Increase time) Dynamic Sitting Balance Dynamic Sitting - Balance Support: Feet supported;Bilateral upper extremity supported Dynamic Sitting - Level of Assistance: 6: Modified independent (Device/Increase time) Static Standing Balance Static Standing - Balance Support: Bilateral upper extremity supported (RW) Static Standing - Level of Assistance: 6: Modified independent (Device/Increase time) Dynamic Standing Balance Dynamic Standing - Balance Support: Bilateral  upper extremity supported (RW) Dynamic Standing - Level of Assistance: 6: Modified independent (Device/Increase time) Dynamic Standing - Comments: with transfers and gait Extremity/Trunk Assessment RUE Assessment RUE Assessment: Within Functional Limits LUE Assessment LUE Assessment: Within Functional Limits   Nicole Schmidt E Nicole Schmidt 04/12/2021, 7:56 AM

## 2021-04-12 NOTE — Progress Notes (Signed)
[place] and spoke to [employer]. Clinic aware pt will d/c tomorrow from CIR and resume at clinic on Thursday. Pt's HD appts added to pt's AVS. Will fax d/c summary to clinic for continuation of care as soon as completed.   Melven Sartorius Renal Navigator [phone]

## 2021-04-13 ENCOUNTER — Other Ambulatory Visit (HOSPITAL_COMMUNITY): Payer: Self-pay

## 2021-04-13 LAB — GLUCOSE, CAPILLARY
Glucose-Capillary: 105 mg/dL — ABNORMAL HIGH (ref 70–99)
Glucose-Capillary: 149 mg/dL — ABNORMAL HIGH (ref 70–99)
Glucose-Capillary: 91 mg/dL (ref 70–99)
Glucose-Capillary: 115 mg/dL — ABNORMAL HIGH (ref 70–99)

## 2021-04-13 NOTE — Progress Notes (Signed)
?[facility] ?Progress Note  ? ?Summary:  56 yo w/ hx breast cancer, anemia, CKD, DM2, HTN, gout, obesity, hx PE, afib on eliquis, ESRD recent start to HD on 02/25/21 at [facility]. Pt had repeat admits for a/c resp failure/ CHF and progression of CKD to ESRD needing dialysis. Last admit at [facility] on 03/17/21 for a/c CHF, resp failure and L ureteral stone sp stent placement and rx'd ESBL UTI w/ abx. Pt debilitated after all of this and was tx'd to [facility] for rehab on 03/28/21.  ? ? ?Assessment/ Plan:   ?Assessment/Plan: ? Debility - following prolonged hospitalizations.  In CIR.   ?Chronic respiratory failure/hypercapnic respiratory failure - on 2L O2 prior to last admission, uses bipap overnight.  Awaiting BiPap for d/c.    ?Acute resp failure - on 2/19 had emergent HD for resp distress. Resolved. ? ESRD -  on HD TTS. Next HD 04/14/21  ? Hypertension - BP's good today. OK to cont toprol xl 12.5 qd for afib. Getting midodrine 10 mg pre HD TTS.  ?Volume overload - persistent problem since recent start of HD in Jan. Wt's continue to drops still has sig LE edema. Cont to lower vol as tolerated.  ? Anemia of CKD - Hgb 8- 9 range here. SP Aranesp 100ug on 2/15, next dose today 2/23 then weekly on thursdays. Fe load started 2/21.  ? Secondary Hyperparathyroidism -  Calcium and phos in goal.  Continue VDRA.  renal diet, on revela. Cal trending up, consider holding os-cal ? Recent ESBL UTI/Hematuria/L ureteral stone-  sp stent placement on 02/14/21. Repeat US showed resolution of hydronephrosis.  F/u urology at OP ?Nutrition - Renal diet w/fluid restrictions.  ?Hx A fib - on eliquis, toprol xl 12.5 qd. ?DMT2 - per PMD ?Access: RIJ TDC. Patient wishes to have her AVF/AVG done as an outpatient/post discharge which is reasonable ?Dispo: EDD 3/1 ? ?[name], MD ?04/13/2021, 12:57 PM ? ? ? ?OP HD: [facility]  ? 4h  400/600  166.5kg  Hep 2000 + 200u/hr  RIJ permcath  ? - Hep B SAg neg here on 2/14, Covid  neg 2/05 ? - had only 1 outpt Rx on 03/17/21 ? - no meds started ? ? ?Subjective:   ? ?Awaiting BiPaP to d/c.  Today is supposed to be d/c day.  ? ?Objective:   ?BP (!) 127/56 (BP Location: Left Arm)   Pulse 90   Temp 98.4 ?F (36.9 ?C) (Oral)   Resp 17   Ht 5\' 4"  (1.626 m)   Wt (!) 157 kg   LMP 07/17/2012   SpO2 97%   BMI 59.41 kg/m?  ? ?Intake/Output Summary (Last 24 hours) at 04/13/2021 1257 ?Last data filed at 04/13/2021 0900 ?Gross per 24 hour  ?Intake 596 ml  ?Output 3100 ml  ?Net -2504 ml  ? ?Weight change: 5.5 kg ? ?Physical Exam: ?Gen: alert ?CVS:rrr, s1s2 ?Resp: clear ?RSW:NIOEV, soft ?Ext: diffuse 1-2+  pitting edema b/l Les ?Neuro: alert, nonfocal ?Dialysis access: RIJ TDC c/d/i ? ?Imaging: ?No results found. ? ?Labs: ?BMET ?Recent Labs  ?Lab 04/07/21 ?1426 04/12/21 ?1409  ?NA 128* 130*  ?K 4.4 4.3  ?CL 92* 95*  ?CO2 26 26  ?GLUCOSE 122* 162*  ?BUN 56* 59*  ?CREATININE 5.29* 5.98*  ?CALCIUM 9.1 10.1  ?PHOS 4.8* 5.4*  ? ?CBC ?Recent Labs  ?Lab 04/06/21 ?1324 04/07/21 ?1425 04/12/21 ?1409  ?WBC 11.1* 10.3 11.0*  ?NEUTROABS 7.9*  --   --   ?  HGB 8.7* 7.9* 8.9*  ?HCT 30.0* 26.3* 29.7*  ?MCV 95.5 94.9 96.7  ?PLT 324 301 275  ? ? ?Medications:   ? ? apixaban  5 mg Oral BID  ? atorvastatin  20 mg Oral QHS  ? bisacodyl  10 mg Rectal Daily  ? calcitRIOL  0.25 mcg Oral Daily  ? calcium carbonate  1,250 mg Oral Q breakfast  ? carbamide peroxide  5 drop Both EARS Daily  ? chlorhexidine  15 mL Mouth Rinse BID  ? Chlorhexidine Gluconate Cloth  6 each Topical Q0600  ? darbepoetin (ARANESP) injection - DIALYSIS  100 mcg Intravenous Q Thu-HD  ? diclofenac Sodium  2 g Topical QID  ? famotidine  10 mg Oral Daily  ? feeding supplement (NEPRO CARB STEADY)  237 mL Oral BID BM  ? folic acid  0,511 mcg Oral Daily  ? gabapentin  300 mg Oral QHS  ? Gerhardt's butt cream  3 application Topical TID  ? insulin aspart  0-15 Units Subcutaneous TID WC  ? levothyroxine  112 mcg Oral QAC breakfast  ? mouth rinse  15 mL Mouth Rinse  q12n4p  ? midodrine  10 mg Oral Q T,Th,Sa-HD  ? multivitamin  1 tablet Oral QHS  ? sevelamer carbonate  800 mg Oral TID WC  ? Vitamin D (Ergocalciferol)  50,000 Units Oral Weekly  ? ? ? ? ? ? ?

## 2021-04-13 NOTE — Progress Notes (Signed)
Contacted CSW to see if pt will d/c today as planned. Pt will not d/c today but possibly Friday per CSW. [place] and spoke to [place], Therapist, sports. Darel Hong that pt will not be at clinic tomorrow but possibly Saturday. Will provide update to clinic once d/c date confirmed.  ? ?Melven Sartorius ?Renal Navigator ?[phone] ?

## 2021-04-13 NOTE — Progress Notes (Addendum)
PROGRESS NOTE   Subjective/Complaints: No new complaints this morning.  She is awaiting hearing about her Bipap- discussed with Christian and Pam- not sure exact date when it will be delivered  ROS: + Lower extremity edema, right ankle pain, denies shortness of breath  Objective:   No results found. Recent Labs    04/12/21 1409  WBC 11.0*  HGB 8.9*  HCT 29.7*  PLT 275    Recent Labs    04/12/21 1409  NA 130*  K 4.3  CL 95*  CO2 26  GLUCOSE 162*  BUN 59*  CREATININE 5.98*  CALCIUM 10.1     Intake/Output Summary (Last 24 hours) at 04/13/2021 0950 Last data filed at 04/13/2021 0900 Gross per 24 hour  Intake 596 ml  Output 3101 ml  Net -2505 ml        Physical Exam: Vital Signs Blood pressure (!) 159/86, pulse 85, temperature 98.6 F (37 C), temperature source Oral, resp. rate 18, height 5\' 4"  (1.626 m), weight (!) 157 kg, last menstrual period 07/17/2012, SpO2 100 %.  General: No acute distress, superobese. BMI 59.41 Mood and affect are appropriate Heart: Regular rate and rhythm no rubs murmurs or extra sounds Lungs: Clear to auscultation, breathing unlabored, no rales or wheezes Abdomen: Positive bowel sounds, soft nontender to palpation, nondistended Extremities: No clubbing, cyanosis.  Lower extremity edema. Skin: Stasis dermatitis bilateral pre tibial  Neurologic: Alert Right lower extremity: 4-/5 proximal distal Left lower extremity: 3/5 proximal distal   Assessment/Plan: 1. Functional deficits which require 3+ hours per day of interdisciplinary therapy in a comprehensive inpatient rehab setting. Physiatrist is providing close team supervision and 24 hour management of active medical problems listed below. Physiatrist and rehab team continue to assess barriers to discharge/monitor patient progress toward functional and medical goals  Care Tool:  Bathing    Body parts bathed by patient: Right  arm, Left arm, Chest, Abdomen, Front perineal area, Right upper leg, Left upper leg, Face, Buttocks, Right lower leg, Left lower leg   Body parts bathed by helper: Right lower leg, Left lower leg, Buttocks (Areas the pt was unable to address)     Bathing assist Assist Level: Independent with assistive device Assistive Device Comment: Long handled sponge, seated bathing   Upper Body Dressing/Undressing Upper body dressing   What is the patient wearing?: Pull over shirt    Upper body assist Assist Level: Independent    Lower Body Dressing/Undressing Lower body dressing      What is the patient wearing?: Pants     Lower body assist Assist for lower body dressing: Independent with assitive device Assistive Device Comment: with reacher   Toileting Toileting Toileting Activity did not occur (Clothing management and hygiene only): Refused  Toileting assist Assist for toileting: Independent with assistive device     Transfers Chair/bed transfer  Transfers assist  Chair/bed transfer activity did not occur: Refused (additional assessing needed in this area)  Chair/bed transfer assist level: Independent with assistive device Chair/bed transfer assistive device: Programmer, multimedia   Ambulation assist   Ambulation activity did not occur: Refused (additional assessing needed in this area)  Assist level: Independent with  assistive device Assistive device: Walker-rolling Max distance: 114ft   Walk 10 feet activity   Assist  Walk 10 feet activity did not occur: Safety/medical concerns (weakness, fatigue, deconditioning)  Assist level: Independent with assistive device Assistive device: Walker-rolling   Walk 50 feet activity   Assist Walk 50 feet with 2 turns activity did not occur: Safety/medical concerns (weakness, fatigue, deconditioning)  Assist level: Independent with assistive device Assistive device: Walker-rolling    Walk 150 feet  activity   Assist Walk 150 feet activity did not occur: Safety/medical concerns (weakness, fatigue, deconditioning)  Assist level: Independent with assistive device Assistive device: Walker-rolling    Walk 10 feet on uneven surface  activity   Assist Walk 10 feet on uneven surfaces activity did not occur: Safety/medical concerns (weakness, fatigue, deconditioning)   Assist level: Supervision/Verbal cueing Assistive device: Walker-rolling   Wheelchair     Assist Is the patient using a wheelchair?: Yes Type of Wheelchair: Manual Wheelchair activity did not occur: Safety/medical concerns (weakness, fatigue, deconditioning)  Wheelchair assist level: Dependent - Patient 0%      Wheelchair 50 feet with 2 turns activity    Assist    Wheelchair 50 feet with 2 turns activity did not occur: Safety/medical concerns (weakness, fatigue, deconditioning)   Assist Level: Dependent - Patient 0%   Wheelchair 150 feet activity     Assist  Wheelchair 150 feet activity did not occur: Safety/medical concerns (weakness, fatigue, deconditioning)   Assist Level: Dependent - Patient 0%   Blood pressure (!) 159/86, pulse 85, temperature 98.6 F (37 C), temperature source Oral, resp. rate 18, height 5\' 4"  (1.626 m), weight (!) 157 kg, last menstrual period 07/17/2012, SpO2 100 %.    Medical Problem List and Plan: 1. Functional deficits secondary to debility/acute on chronic hypercapnia respiratory failure.  Oxygen therapy as directed/BiPAP  D/c home today  RT eval for spirometry ordered.  2.  Antithrombotics: -DVT/anticoagulation:  Pharmaceutical: Other (comment) Eliquis             -antiplatelet therapy: N/A 3. Pain from pressure injury: improved, Neurontin 300 mg nightly, decrease tramadol to q12H prn for moderate pain  Controlled on 2/25 4. Mood: Melatonin as needed             -antipsychotic agents: N/A 5. Neuropsych: This patient is capable of making decisions on her own  behalf. 6. Skin/Wound Care: Routine skin checks 7. Fluids/Electrolytes/Nutrition: Routine in and outs  8.  End-stage renal disease on hemodialysis.  Continue hemodialysis as per renal services.  Permacath placement 02/25/2021.Marland Kitchen Discussed her recent creatinine levels with her 9.  Hypertension: Norvasc 5 mg daily, decrease Toprol XL to 12.5 mg daily.  Monitor with increased mobility. Decrease Tramadol to q8H prn.   Controlled on 2/25 10.  History of atrial fibrillation/pulmonary emboli/acute on chronic CHF.  Continue Eliquis.  Cardiac rate controlled. continue beta-blocker.Monitor for fluid overload Vitals:   04/13/21 0340 04/13/21 0506  BP:  (!) 159/86  Pulse: 88 85  Resp: 18 18  Temp:  98.6 F (37 C)  SpO2: 96% 100%    11.  Hypothyroidism.  continue Synthroid 12.  Hyperlipidemia.  continue Lipitor 13.  History of chronic anemia. 14.  Diabetes mellitus.  Latest hemoglobin A1c 7.2.  SSI. Placed nursing order for no fluids other than water.  15.  History of hematuria.  Left ureteral stone with recent ureteroscopy/laser lithotripsy.  Repeat ultrasound showed resolution of left hydronephrosis.  Follow-up Dr.Sninsky. 16.  Morbid obesity.  BMI 62.74-->60.55. Provided  dietary education.  17.  Acute blood loss anemia/leukocytosis.    WBCs 10.3 on 2/23  Hemoglobin 7.9 on 2/23, continue to monitor 18.  Urine culture positive ESBL/Proteus/E. coli.  Treat empirically with  one dose fosfomycin 2/9.  Contact precautions. Discussed with her that she will need these until day of discharge.  19.Leukocytosis.  Resolved. discussed that UA is eqiuvocal, f/u UC. continue to monitor w/ HD. Follow-up per infectious disease Dr.Ravisankar and presently maintained on IV Invanz x 4 does. 20. Sensation of water in ears:  d/c antibiotic drop, continue debrox bilateral ears daily 21. Right ankle pain related to osteoarthritis, will start Voltaren gel has as needed oxycodone for more severe pain, placed nursing order to  ice 15 minutes three times per day, messaged nurse about this 22. Somnolence: worsened by oxycodone: discussed limiting use for only severe ankle pain. Will send her short supply for home- she uses one every few days 23. OSA/OHS with hypercapneia, acute respiratory failure: discussed importance of using Bipap at night. ABG tomorrow.   Compliant 24. Insomnia: asked nursing to place sign on door not to wake before 7am.  25. Superobese BMI 60.74: provided dietary education. patient's morbid obesity/OHS causes thoracic restrictive disease: BMI improved to 59.41. Filled out form for her home Bipap.     LOS: 16 days A FACE TO FACE EVALUATION WAS PERFORMED  Nicole Schmidt 04/13/2021, 9:50 AM

## 2021-04-13 NOTE — Patient Care Conference (Signed)
Inpatient RehabilitationTeam Conference and Plan of Care Update ?Date: 04/13/2021   Time: 11:32 AM  ? ? ?Patient Name: Nicole Schmidt      ?Medical Record Number: [id number]  ?Date of Birth: October 04, 1965 ?Sex: Female         ?Room/Bed: 4W06C/4W06C-01 ?Payor Info: Payor: Teacher, music / Plan: BCBS COMM PPO / Product Type: *No Product type* /   ? ?Admit Date/Time:  03/28/2021  2:47 PM ? ?Primary Diagnosis:  Debility ? ?Hospital Problems: Principal Problem: ?  Debility ?Active Problems: ?  OSA (obstructive sleep apnea) ?  Hypoxia ?  Acute blood loss anemia ?  Super obese ?  Controlled type 2 diabetes mellitus with hyperglycemia, without long-term current use of insulin ([place]) ?  ESRD on hemodialysis ([place]) ? ? ? ?Expected Discharge Date: Expected Discharge Date: 04/13/21 ? ?Team Members Present: ?Physician leading conference: Dr. Leeroy Cha ?Social Worker Present: Erlene Quan, BSW ?Nurse Present: Dorien Chihuahua, RN ?PT Present: Estevan Ryder, PT ?OT Present: Other (comment) [facility] Alphonsa Gin, [place]) ?SLP Present: Weston Anna, SLP ?PPS Coordinator present : Gunnar Fusi, SLP ? ?   Current Status/Progress Goal Weekly Team Focus  ?Bowel/Bladder ? ?   Continent; HD, T, H, S        ?Swallow/Nutrition/ Hydration ? ?           ?ADL's ? ? mod I all self-care and ambulatory transfers with RW  mod I (at goal level)  d/c from OT   ?Mobility ? ? goal level  ModI  d/c'd from PT   ?Communication ? ?           ?Safety/Cognition/ Behavioral Observations ?           ?Pain ? ?   Prns for pain control   Pain at or below level 4 with prns   Monitor need for and effectiveness of medications  ?Skin ? ?   MASD to buttocks   MASD healing   Skin assessed q shift and reposition to keep skin dry  ? ? ?Discharge Planning:  ?discharging home on Wed. Patient set. Will pt require medical transport?   ?Team Discussion: ?Patient physically ready for discharge; some concerns noted regarding Bi-Pap vs CPAP. Patient told of risks if discharged  without Bi-Pap and authorization is still pending per insurance.  ?Patient on target to meet rehab goals: ?yes, currently has met therapy goals. Mod I for self care and Mod I - supervision for mobility.  ? ?*See Care Plan and progress notes for long and short-term goals.  ? ?Revisions to Treatment Plan:  ?Pulmonary consulted regarding Bi-Pap recommendations, tests, etc for authorization ?  ?Teaching Needs: ?Education completed on safety, medications, skin care, transfers, etc. ?  ?Current Barriers to Discharge: ?Decreased caregiver support, Home enviroment access/layout, and pulmonary/OHS issues ? ?Possible Resolutions to Barriers: ?Ramp installation complete per patient to entry of home ?HH follow up services arranged ?Has TTB, BSC ?  ? ? Medical Summary ?Current Status: patient does not want to leave today because she cannot get Bipap, discussed with Margreta Journey and this has not been approved by insurance ?  ? Barriers to Discharge Comments: patient does not want to leave today because she cannot get Bipap, discussed with Margreta Journey and this has not been approved by insurance ?Possible Resolutions to [employer] Focus: consulting pulmonology today to discuss risks and benefits of CPAP versus Bipap, any other options to get her a Bipap at home, continue oxycodone at discharge for severe pain ? ? ?Continued  Need for Acute Rehabilitation Level of Care: The patient requires daily medical management by a physician with specialized training in physical medicine and rehabilitation for the following reasons: ?Direction of a multidisciplinary physical rehabilitation program to maximize functional independence : Yes ?Medical management of patient stability for increased activity during participation in an intensive rehabilitation regime.: Yes ?Analysis of laboratory values and/or radiology reports with any subsequent need for medication adjustment and/or medical intervention. : Yes ? ? ?I attest that I was present, lead  the team conference, and concur with the assessment and plan of the team. ? ? ?Dorien Chihuahua B ?04/13/2021, 4:30 PM  ? ? ? ? ? ? ?

## 2021-04-13 NOTE — Progress Notes (Signed)
Patient ID: MIRABELLE CYPHERS, female   DOB: Aug 21, 1965, 56 y.o.   MRN: [id number] ? ?Team Conference Report to Patient/Family ? ?Team Conference discussion was reviewed with the patient and caregiver, including goals, any changes in plan of care and target discharge date.  Patient and caregiver express understanding and are in agreement.  The patient has a target discharge date of 04/13/21. ? ?SW met with patient and informed patient she is medically ready for d/c. Patient prefers to extend d/c due to waiting on BI-PAP. Pt currently has C-PAP.  CPAP use is adequate-per MD. Patient will require a repeat sleep study which is done on the OP basis. Pulmonary will follow up with patient on tomorrow ?Dyanne Iha ?04/13/2021, 2:16 PM  ?

## 2021-04-13 NOTE — Progress Notes (Signed)
Patient ID: Nicole Schmidt, female   DOB: 1965-08-25, 56 y.o.   MRN: [id number] ? ?[name] presents with acute on chronic hypercapnia respiratory failure with morbid obesity/OHS that is causing thoracic restrictive disease.  The use of the NIV will treat patient's high PC02 levels (51 on 03/26/21 while using BiPAP during admission) and ventilatory defects seen during recent PFT on 04/12/21.  NIV use can reduce risk of exacerbations and future hospitalizations when used at night and during the day.  All alternate devices [id number] and [id number]) have been proven ineffective to provide essential volume control necessary to maintain acceptable CO2 levels.  An NIV with IVAPS is necessary to prevent patient from life-threatening harm.  Interruption or failure to provide NIV would quickly lead to exacerbation of the patient's condition, hospital admission, and likely harm to the patient. Continued use is preferred.  Patient is able to protect their airways and clear secretions on their own. ? ?

## 2021-04-13 NOTE — Progress Notes (Signed)
Patient ID: Nicole Schmidt, female   DOB: 05-12-1965, 56 y.o.   MRN: [id number] ? ?SW met with patient and discussed the plan to still d/c today. Pt awaiting Bi PAP. Sw submitted documentation for justification. [employer] will follow up and update patient on insurance approval for the items. Pt will d/c home with CPAP. [employer] will complete a home visit with patient and switch items if insurance approves. ?

## 2021-04-13 NOTE — Consult Note (Signed)
NAME:  Nicole Schmidt, MRN:  [id number], DOB:  12-10-1965, LOS: 35 ADMISSION DATE:  03/28/2021, CONSULTATION DATE:  04/13/21  REFERRING MD:  Rehab, CHIEF COMPLAINT:  OSA   History of Present Illness:  56 year old woman whom we are seeing in consultation for evaluation of OSA.  Patient was admitted 03/20/2021.  This is after extensive hospitalization in January 2023.  Discharged on 2/1 but represented to the ED 2/2 with worsening swelling, dyspnea.  Found to be hypoxemic.  Blood gas was notable for hypercarbia after somnolence.  Placed on BiPAP.  Mid to the hospital.  Received dialysis.  After several days was accepted to inpatient rehab.  Has been there since.  She is continue nightly CPAP use.  No major issues.  Ready for discharge from inpatient rehab.  However, she reports someone at the [facility] ICU told her "she would die if I did not wear BiPAP."  Unclear the contacts but she took this to mean she had to have BiPAP at home.  A long discussion regarding sleep disordered breathing OSA.  Long discussion of the rationale based on current weight similar to prior weight as well as only modest elevation of PCO2 off noninvasive positive ventilation on most recent blood gas 03/26/2021 that CPAP was adequate for treating her current OSA.  However she is not comfortably in discharge from the hospital based on what someone told her in the ICU.  On review this appears to be a hospitalist at [facility].  Pertinent  Medical History  ESRD on HD  Significant Hospital Events: Including procedures, antibiotic start and stop dates in addition to other pertinent events   04/13/2021 PCCM consult for OSA  Interim History / Subjective:    Objective   Blood pressure (!) 127/56, pulse 90, temperature 98.4 F (36.9 C), temperature source Oral, resp. rate 17, height 5\' 4"  (1.626 m), weight (!) 157 kg, last menstrual period 07/17/2012, SpO2 97 %.        Intake/Output Summary (Last 24 hours) at 04/13/2021 1458 Last data filed  at 04/13/2021 0900 Gross per 24 hour  Intake 476 ml  Output 3100 ml  Net -2624 ml   Filed Weights   04/11/21 1452 04/12/21 1401 04/12/21 1747  Weight: (!) 155 kg (!) 160.5 kg (!) 157 kg    Examination: General: Sitting in chair, no acute distress  eyes: EOMI, no icterus Neck: Supple, habitus precludes adequate JVP assessment Lungs: Normal work of breathing, on nasal cannula Cardiovascular: Warm, no edema noted Abdomen: Nondistended, bowel sounds present   Resolved Hospital Problem list     Assessment & Plan:   Sleep disordered breathing/OSA: Most recent polysomnography/CPAP titration 2021.  Currently auto titrating machine 5 to 20 cm of water at home.  Weight most recently same as was in 2021 at time of study.  Suspect CPAP is adequate for her breathing.  She is dismayed by being told at the [facility] ICU that she would die without BiPAP.  I suspect this was being referred to in the acute sense that she was hypercarbic and decompensated the time.  However now that she is in her normal state of respiratory help see no reason why her current CPAP would not be effective. --To help reassure, recommend using BiPAP tonight with a.m. ABG to assess for CO2 retention not adequate control for BiPAP, if this is the case would recommend a trilogy ventilator in AVAPS mode --If PCO2 is well controlled, further reassurance that CPAP therapy is adequate --Regardless if the above  is pursued or she is discharged on home CPAP, if there is concern then she should have an outpatient CPAP titration for follow-up to formally assess the ideal machine and settings required to treat her OSA  I spent 50 minutes in the care of the patient the vast majority with was spent in face-to-face visit and coordination of care, additional time reviewing records and lab/studies  Best Practice ( I am attesting your note in the abdomen the wrongright click and "Reselect all SmartList Selections" daily)   Per Primary  Labs    CBC: Recent Labs  Lab 04/07/21 1425 04/12/21 1409  WBC 10.3 11.0*  HGB 7.9* 8.9*  HCT 26.3* 29.7*  MCV 94.9 96.7  PLT 301 262    Basic Metabolic Panel: Recent Labs  Lab 04/07/21 1426 04/12/21 1409  NA 128* 130*  K 4.4 4.3  CL 92* 95*  CO2 26 26  GLUCOSE 122* 162*  BUN 56* 59*  CREATININE 5.29* 5.98*  CALCIUM 9.1 10.1  PHOS 4.8* 5.4*   GFR: Estimated Creatinine Clearance: 15.7 mL/min (A) (by C-G formula based on SCr of 5.98 mg/dL (H)). Recent Labs  Lab 04/07/21 1425 04/12/21 1409  WBC 10.3 11.0*    Liver Function Tests: Recent Labs  Lab 04/07/21 1426 04/12/21 1409  ALBUMIN 2.5* 2.8*   No results for input(s): LIPASE, AMYLASE in the last 168 hours. No results for input(s): AMMONIA in the last 168 hours.  ABG    Component Value Date/Time   PHART 7.31 (L) 03/26/2021 1419   PCO2ART 51 (H) 03/26/2021 1419   PO2ART 84 03/26/2021 1419   HCO3 25.7 03/26/2021 1419   TCO2 29.5 10/21/2014 1240   ACIDBASEDEF 0.8 03/26/2021 1419   O2SAT 95.2 03/26/2021 1419     Coagulation Profile: No results for input(s): INR, PROTIME in the last 168 hours.  Cardiac Enzymes: No results for input(s): CKTOTAL, CKMB, CKMBINDEX, TROPONINI in the last 168 hours.  HbA1C: Hgb A1c MFr Bld  Date/Time Value Ref Range Status  02/09/2021 03:43 PM 7.2 (H) 4.8 - 5.6 % Final    Comment:    (NOTE) Pre diabetes:          5.7%-6.4%  Diabetes:              >6.4%  Glycemic control for   <7.0% adults with diabetes   02/09/2020 01:40 PM 6.4 (H) 4.8 - 5.6 % Final    Comment:    (NOTE) Pre diabetes:          5.7%-6.4%  Diabetes:              >6.4%  Glycemic control for   <7.0% adults with diabetes     CBG: Recent Labs  Lab 04/12/21 0540 04/12/21 1154 04/12/21 2043 04/13/21 0640 04/13/21 1141  GLUCAP 95 106* 151* 105* 149*    Review of Systems:   No chest pain with exertion, no orthopnea or PND, comprehensive review of systems otherwise negative.  Past Medical  History:  She,  has a past medical history of (HFpEF) heart failure with preserved ejection fraction ([place]), Acquired hypothyroidism (02/09/2020), Anemia, Arthritis, Breast cancer ([place]) (2014), Breast cancer of upper-inner quadrant of right female breast ([place]), CHF (congestive heart failure) ([place]), CKD (chronic kidney disease), stage III ([place]), Diabetes mellitus type 2 in obese ([place]) (02/09/2020), Diabetes mellitus without complication ([place]), Dyslipidemia (02/09/2020), Essential hypertension, Gout, Hypothyroidism, Menopause, Morbid obesity ([place]), Persistent atrial fibrillation ([place]), Personal history of radiation therapy, Pulmonary embolism ([place]) (10/2014), and Thyroid goiter.  Surgical History:   Past Surgical History:  Procedure Laterality Date   BREAST BIOPSY Right 2014   breast ca   BREAST EXCISIONAL BIOPSY Right 07/19/2012   breast ca   BREAST SURGERY Right 2014   with sentinel node bx subareaolar duct excision   CARDIOVERSION N/A 11/28/2018   Procedure: CARDIOVERSION;  Surgeon: Minna Merritts, MD;  Location: [facility] ORS;  Service: Cardiovascular;  Laterality: N/A;   COLONOSCOPY WITH PROPOFOL N/A 01/30/2017   Procedure: COLONOSCOPY WITH PROPOFOL;  Surgeon: Christene Lye, MD;  Location: [facility] ENDOSCOPY;  Service: Endoscopy;  Laterality: N/A;   CYSTOSCOPY W/ URETERAL STENT PLACEMENT Left 02/14/2021   Procedure: CYSTOSCOPY WITH RETROGRADE PYELOGRAM/URETERAL STENT PLACEMENT;  Surgeon: Billey Co, MD;  Location: [facility] ORS;  Service: Urology;  Laterality: Left;   DIALYSIS/PERMA CATHETER INSERTION N/A 02/24/2021   Procedure: DIALYSIS/PERMA CATHETER INSERTION;  Surgeon: Algernon Huxley, MD;  Location: [facility];  Service: Cardiovascular;  Laterality: N/A;     Social History:   reports that she has never smoked. She has never used smokeless tobacco. She reports that she does not drink alcohol and does not use drugs.   Family History:  Her family history includes Atrial  fibrillation in her mother; Colon cancer (age of onset: 46) in her cousin; Diabetes in her father; Heart attack in her mother; Heart failure in her mother; Hypertension in her father and mother; Parkinson's disease in her father; Rheum arthritis in her mother. There is no history of Breast cancer.   Allergies No Known Allergies   Home Medications  Prior to Admission medications   Medication Sig Start Date End Date Taking? Authorizing Provider  acetaminophen (TYLENOL) 325 MG tablet Take 2 tablets (650 mg total) by mouth every 6 (six) hours as needed for mild pain (or Fever >/= 101). 03/15/21   Angiulli, Lavon Paganini, PA-C  albuterol (VENTOLIN HFA) 108 (90 Base) MCG/ACT inhaler Inhale 2 puffs into the lungs every 6 (six) hours as needed for wheezing or shortness of breath. 04/12/21   Angiulli, Lavon Paganini, PA-C  amLODipine (NORVASC) 5 MG tablet Take 1 tablet (5 mg total) by mouth daily. 03/16/21   Janalee Dane, PA-C  apixaban (ELIQUIS) 5 MG TABS tablet Take 1 tablet (5 mg total) by mouth 2 (two) times daily. 04/12/21   Angiulli, Lavon Paganini, PA-C  atorvastatin (LIPITOR) 20 MG tablet Take 1 tablet (20 mg total) by mouth at bedtime. 04/12/21   Angiulli, Lavon Paganini, PA-C  calcitRIOL (ROCALTROL) 0.25 MCG capsule Take 1 capsule (0.25 mcg total) by mouth daily. 04/12/21   Angiulli, Lavon Paganini, PA-C  calcium carbonate (OS-CAL - DOSED IN MG OF ELEMENTAL CALCIUM) 1250 (500 Ca) MG tablet Take 1 tablet (1,250 mg total) by mouth daily with breakfast. 04/12/21   Angiulli, Lavon Paganini, PA-C  calcium carbonate (OSCAL) 1500 (600 Ca) MG TABS tablet Take 1 tablet (1,500 mg total) by mouth daily. 03/15/21   Angiulli, Lavon Paganini, PA-C  diclofenac Sodium (VOLTAREN) 1 % GEL Apply 2 g topically 4 (four) times daily. 04/12/21   Angiulli, Lavon Paganini, PA-C  ergocalciferol (VITAMIN D2) 1.25 MG (50000 UT) capsule Take 1 capsule (50,000 Units total) by mouth once a week. 04/12/21   Angiulli, Lavon Paganini, PA-C  famotidine (PEPCID) 10 MG tablet Take 1 tablet  (10 mg total) by mouth daily. 04/12/21   Angiulli, Lavon Paganini, PA-C  ferric gluconate 250 mg in sodium chloride 0.9 % 250 mL Inject 250 mg into the vein Every Tuesday,Thursday,and Saturday  with dialysis. 04/12/21   Angiulli, Lavon Paganini, PA-C  folic acid (FOLVITE) 1 MG tablet Take 1 tablet (1 mg total) by mouth daily. 04/12/21   Angiulli, Lavon Paganini, PA-C  gabapentin (NEURONTIN) 300 MG capsule Take 1 capsule (300 mg total) by mouth at bedtime. 04/12/21   Angiulli, Lavon Paganini, PA-C  levothyroxine (SYNTHROID) 112 MCG tablet Take 1 tablet (112 mcg total) by mouth daily before breakfast. 04/12/21   Angiulli, Lavon Paganini, PA-C  melatonin 5 MG TABS Take 2 tablets (10 mg total) by mouth at bedtime as needed (sleep). 04/12/21   Angiulli, Lavon Paganini, PA-C  metoprolol succinate (TOPROL-XL) 25 MG 24 hr tablet Take 1 tablet (25 mg total) by mouth daily. 03/29/21   Fritzi Mandes, MD  midodrine (PROAMATINE) 10 MG tablet Take 1 tablet (10 mg total) by mouth Every Tuesday,Thursday,and Saturday with dialysis. 04/12/21   Angiulli, Lavon Paganini, PA-C  Multiple Vitamins-Minerals (CERTAVITE/ANTIOXIDANTS) TABS Take 1 tablet by mouth at bedtime. 03/28/21   Fritzi Mandes, MD  Nystatin (GERHARDT'S BUTT CREAM) CREA Apply 3 application topically 3 (three) times daily. 03/15/21   Angiulli, Lavon Paganini, PA-C  polyethylene glycol powder (GLYCOLAX/MIRALAX) 17 GM/SCOOP powder Take 1 capful (17 g) with water by mouth daily as needed for moderate constipation. 03/28/21   Fritzi Mandes, MD  sevelamer carbonate (RENVELA) 800 MG tablet Take 1 tablet (800 mg total) by mouth 3 (three) times daily with meals. 04/12/21   Angiulli, Lavon Paganini, PA-C  traMADol (ULTRAM) 50 MG tablet Take 1 tablet (50 mg total) by mouth every 6 (six) hours as needed for moderate pain. 03/28/21   Fritzi Mandes, MD  traMADol (ULTRAM) 50 MG tablet Take 1 tablet (50 mg total) by mouth every 12 (twelve) hours as needed for moderate pain. 04/12/21   Angiulli, Lavon Paganini, PA-C  fluticasone-salmeterol (ADVAIR HFA)  230-21 MCG/ACT inhaler Inhale 2 puffs into the lungs 2 (two) times daily as needed.  Patient not taking: No sig reported  02/09/20  [provider]     Critical care time: n/a    Lanier Clam, MD See Shea Evans for contact info

## 2021-04-14 LAB — CBC
HCT: 30.4 % — ABNORMAL LOW (ref 36.0–46.0)
Hemoglobin: 8.6 g/dL — ABNORMAL LOW (ref 12.0–15.0)
MCH: 27.9 pg (ref 26.0–34.0)
MCHC: 28.3 g/dL — ABNORMAL LOW (ref 30.0–36.0)
MCV: 98.7 fL (ref 80.0–100.0)
Platelets: 203 10*3/uL (ref 150–400)
RBC: 3.08 MIL/uL — ABNORMAL LOW (ref 3.87–5.11)
RDW: 18.6 % — ABNORMAL HIGH (ref 11.5–15.5)
WBC: 8.6 10*3/uL (ref 4.0–10.5)
nRBC: 0 % (ref 0.0–0.2)

## 2021-04-14 LAB — BLOOD GAS, ARTERIAL
Acid-Base Excess: 0.8 mmol/L (ref 0.0–2.0)
Bicarbonate: 28.5 mmol/L — ABNORMAL HIGH (ref 20.0–28.0)
Drawn by: 252031
O2 Saturation: 97.5 %
Patient temperature: 37
pCO2 arterial: 58 mmHg — ABNORMAL HIGH (ref 32–48)
pH, Arterial: 7.3 — ABNORMAL LOW (ref 7.35–7.45)
pO2, Arterial: 105 mmHg (ref 83–108)

## 2021-04-14 LAB — RENAL FUNCTION PANEL
Albumin: 2.7 g/dL — ABNORMAL LOW (ref 3.5–5.0)
Anion gap: 10 (ref 5–15)
BUN: 46 mg/dL — ABNORMAL HIGH (ref 6–20)
CO2: 28 mmol/L (ref 22–32)
Calcium: 9.5 mg/dL (ref 8.9–10.3)
Chloride: 94 mmol/L — ABNORMAL LOW (ref 98–111)
Creatinine, Ser: 5.14 mg/dL — ABNORMAL HIGH (ref 0.44–1.00)
GFR, Estimated: 9 mL/min — ABNORMAL LOW (ref >60)
Glucose, Bld: 125 mg/dL — ABNORMAL HIGH (ref 70–99)
Phosphorus: 5.1 mg/dL — ABNORMAL HIGH (ref 2.5–4.6)
Potassium: 4.1 mmol/L (ref 3.5–5.1)
Sodium: 132 mmol/L — ABNORMAL LOW (ref 135–145)

## 2021-04-14 LAB — GLUCOSE, CAPILLARY
Glucose-Capillary: 88 mg/dL (ref 70–99)
Glucose-Capillary: 146 mg/dL — ABNORMAL HIGH (ref 70–99)

## 2021-04-14 MED ORDER — HEPARIN SODIUM (PORCINE) 1000 UNIT/ML DIALYSIS
2000.0000 [IU] | INTRAMUSCULAR | Status: DC | PRN
  Filled 2021-04-14: qty 2

## 2021-04-14 NOTE — Progress Notes (Signed)
PROGRESS NOTE   Subjective/Complaints: Discussed that her CO2 is 40- showing she is retaining and we are trying to get her a Triology. Appreciate pulmonary seeing her today.   ROS: + Lower extremity edema, right ankle pain, denies shortness of breath  Objective:   No results found. Recent Labs    04/12/21 1409  WBC 11.0*  HGB 8.9*  HCT 29.7*  PLT 275    Recent Labs    04/12/21 1409  NA 130*  K 4.3  CL 95*  CO2 26  GLUCOSE 162*  BUN 59*  CREATININE 5.98*  CALCIUM 10.1     Intake/Output Summary (Last 24 hours) at 04/14/2021 1054 Last data filed at 04/13/2021 1855 Gross per 24 hour  Intake 356 ml  Output --  Net 356 ml        Physical Exam: Vital Signs Blood pressure 128/67, pulse 90, temperature 97.7 F (36.5 C), temperature source Oral, resp. rate 18, height 5\' 4"  (1.626 m), weight (!) 161.5 kg, last menstrual period 07/17/2012, SpO2 97 %.  General: No acute distress, superobese. BMI 61.11 Mood and affect are appropriate Heart: Regular rate and rhythm no rubs murmurs or extra sounds Lungs: Clear to auscultation, breathing unlabored, no rales or wheezes Abdomen: Positive bowel sounds, soft nontender to palpation, nondistended Extremities: No clubbing, cyanosis.  Lower extremity edema. Skin: Stasis dermatitis bilateral pre tibial  Neurologic: Alert Right lower extremity: 4-/5 proximal distal Left lower extremity: 3/5 proximal distal   Assessment/Plan: 1. Functional deficits which require 3+ hours per day of interdisciplinary therapy in a comprehensive inpatient rehab setting. Physiatrist is providing close team supervision and 24 hour management of active medical problems listed below. Physiatrist and rehab team continue to assess barriers to discharge/monitor patient progress toward functional and medical goals  Care Tool:  Bathing    Body parts bathed by patient: Right arm, Left arm, Chest,  Abdomen, Front perineal area, Right upper leg, Left upper leg, Face, Buttocks, Right lower leg, Left lower leg   Body parts bathed by helper: Right lower leg, Left lower leg, Buttocks (Areas the pt was unable to address)     Bathing assist Assist Level: Independent with assistive device Assistive Device Comment: Long handled sponge, seated bathing   Upper Body Dressing/Undressing Upper body dressing   What is the patient wearing?: Pull over shirt    Upper body assist Assist Level: Independent    Lower Body Dressing/Undressing Lower body dressing      What is the patient wearing?: Pants     Lower body assist Assist for lower body dressing: Independent with assitive device Assistive Device Comment: with reacher   Toileting Toileting Toileting Activity did not occur (Clothing management and hygiene only): Refused  Toileting assist Assist for toileting: Independent with assistive device     Transfers Chair/bed transfer  Transfers assist  Chair/bed transfer activity did not occur: Refused (additional assessing needed in this area)  Chair/bed transfer assist level: Independent with assistive device Chair/bed transfer assistive device: Programmer, multimedia   Ambulation assist   Ambulation activity did not occur: Refused (additional assessing needed in this area)  Assist level: Independent with assistive device Assistive device:  Walker-rolling Max distance: 112ft   Walk 10 feet activity   Assist  Walk 10 feet activity did not occur: Safety/medical concerns (weakness, fatigue, deconditioning)  Assist level: Independent with assistive device Assistive device: Walker-rolling   Walk 50 feet activity   Assist Walk 50 feet with 2 turns activity did not occur: Safety/medical concerns (weakness, fatigue, deconditioning)  Assist level: Independent with assistive device Assistive device: Walker-rolling    Walk 150 feet activity   Assist Walk 150 feet  activity did not occur: Safety/medical concerns (weakness, fatigue, deconditioning)  Assist level: Independent with assistive device Assistive device: Walker-rolling    Walk 10 feet on uneven surface  activity   Assist Walk 10 feet on uneven surfaces activity did not occur: Safety/medical concerns (weakness, fatigue, deconditioning)   Assist level: Supervision/Verbal cueing Assistive device: Walker-rolling   Wheelchair     Assist Is the patient using a wheelchair?: Yes Type of Wheelchair: Manual Wheelchair activity did not occur: Safety/medical concerns (weakness, fatigue, deconditioning)  Wheelchair assist level: Dependent - Patient 0%      Wheelchair 50 feet with 2 turns activity    Assist    Wheelchair 50 feet with 2 turns activity did not occur: Safety/medical concerns (weakness, fatigue, deconditioning)   Assist Level: Dependent - Patient 0%   Wheelchair 150 feet activity     Assist  Wheelchair 150 feet activity did not occur: Safety/medical concerns (weakness, fatigue, deconditioning)   Assist Level: Dependent - Patient 0%   Blood pressure 128/67, pulse 90, temperature 97.7 F (36.5 C), temperature source Oral, resp. rate 18, height 5\' 4"  (1.626 m), weight (!) 161.5 kg, last menstrual period 07/17/2012, SpO2 97 %.    Medical Problem List and Plan: 1. Functional deficits secondary to debility/acute on chronic hypercapnia respiratory failure.  Oxygen therapy as directed/BiPAP  D/c home tomorrow.   RT eval for spirometry ordered.  2.  Antithrombotics: -DVT/anticoagulation:  Pharmaceutical: Other (comment) Eliquis             -antiplatelet therapy: N/A 3. Pain from pressure injury: improved, Neurontin 300 mg nightly, decrease tramadol to q12H prn for moderate pain  Controlled on 2/25 4. Mood: Melatonin as needed             -antipsychotic agents: N/A 5. Neuropsych: This patient is capable of making decisions on her own behalf. 6. Skin/Wound Care:  Routine skin checks 7. Fluids/Electrolytes/Nutrition: Routine in and outs  8.  End-stage renal disease on hemodialysis.  Continue hemodialysis as per renal services.  Permacath placement 02/25/2021.Marland Kitchen Discussed her recent creatinine levels with her 9.  Hypertension: Norvasc 5 mg daily, decrease Toprol XL to 12.5 mg daily.  Monitor with increased mobility. Decrease Tramadol to q8H prn.   Controlled on 2/25 10.  History of atrial fibrillation/pulmonary emboli/acute on chronic CHF.  Continue Eliquis.  Cardiac rate controlled. continue beta-blocker.Monitor for fluid overload Vitals:   04/14/21 0100 04/14/21 0412  BP:  128/67  Pulse: 92 90  Resp: 18 18  Temp:  97.7 F (36.5 C)  SpO2: 92% 97%    11.  Hypothyroidism.  continue Synthroid 12.  Hyperlipidemia.  continue Lipitor 13.  History of chronic anemia. 14.  Diabetes mellitus.  Latest hemoglobin A1c 7.2.  SSI. Placed nursing order for no fluids other than water.  15.  History of hematuria.  Left ureteral stone with recent ureteroscopy/laser lithotripsy.  Repeat ultrasound showed resolution of left hydronephrosis.  Follow-up Dr.Sninsky. 16.  Morbid obesity.  BMI 62.74-->60.55. Provided dietary education.  17.  Acute blood loss anemia/leukocytosis.    WBCs 10.3 on 2/23  Hemoglobin 7.9 on 2/23, continue to monitor 18.  Urine culture positive ESBL/Proteus/E. coli.  Treat empirically with  one dose fosfomycin 2/9.  Contact precautions. Discussed with her that she will need these until day of discharge.  19.Leukocytosis.  Resolved. discussed that UA is eqiuvocal, f/u UC. continue to monitor w/ HD. Follow-up per infectious disease Dr.Ravisankar and presently maintained on IV Invanz x 4 does. 20. Sensation of water in ears:  d/c antibiotic drop, may d/c debrox ear drops 21. Right ankle pain related to osteoarthritis, will start Voltaren gel has as needed oxycodone for more severe pain, placed nursing order to ice 15 minutes three times per day, messaged  nurse about this 22. Somnolence: worsened by oxycodone: discussed limiting use for only severe ankle pain. Will send her short supply for home- she uses one every few days 23. OSA/OHS with hypercapneia, acute respiratory failure: discussed importance of using Bipap at night. Retaining even with bipap so will try to get her a trilogy for home use.   Compliant 24. Insomnia: asked nursing to place sign on door not to wake before 7am.  25. Superobese BMI 61.11: provided dietary education. patient's morbid obesity/OHS causes thoracic restrictive disease: BMI improved to 59.41. Filled out form for her home Bipap.  26. Constipation: discussed high fiber foods, use of Miaralax at home after HD.    LOS: 17 days A FACE TO FACE EVALUATION WAS PERFORMED  Clide Deutscher Alekai Pocock 04/14/2021, 10:54 AM

## 2021-04-14 NOTE — Progress Notes (Signed)
[place] and spoke to [place]. Clinic aware pt for d/c tomorrow and will resume at clinic on Saturday.  ? ?Melven Sartorius ?Renal Navigator ?[phone] ?

## 2021-04-14 NOTE — Progress Notes (Signed)
?Nicole Schmidt ?Progress Note  ? ?Summary:  56 yo w/ hx breast cancer, anemia, CKD, DM2, HTN, gout, obesity, hx PE, afib on eliquis, ESRD recent start to HD on 02/25/21 at [facility]. Pt had repeat admits for a/c resp failure/ CHF and progression of CKD to ESRD needing dialysis. Last admit at [facility] on 03/17/21 for a/c CHF, resp failure and L ureteral stone sp stent placement and rx'd ESBL UTI w/ abx. Pt debilitated after all of this and was tx'd to [facility] for rehab on 03/28/21.  ? ? ?Assessment/ Plan:   ?Assessment/Plan: ? Debility - following prolonged hospitalizations.  In CIR.   ?Chronic respiratory failure/hypercapnic respiratory failure - on 2L O2 prior to last admission, uses bipap overnight.  Awaiting BiPap for d/c.    ?Acute resp failure - on 2/19 had emergent HD for resp distress. Resolved. ? ESRD -  on HD TTS. Next HD 04/14/21  ? Hypertension - BP's good today. OK to cont toprol xl 12.5 qd for afib. Getting midodrine 10 mg pre HD TTS.  ?Volume overload - persistent problem since recent start of HD in Jan. Wt's continue to drops still has sig LE edema. Cont to lower vol as tolerated.  ? Anemia of CKD - Hgb 8- 9 range here. SP Aranesp 100ug on 2/15, next dose today 2/23 then weekly on thursdays. Fe load started 2/21.  ? Secondary Hyperparathyroidism -  Calcium and phos in goal.  Continue VDRA.  renal diet, on revela. Cal trending up, consider holding os-cal ? Recent ESBL UTI/Hematuria/L ureteral stone-  sp stent placement on 02/14/21. Repeat US showed resolution of hydronephrosis.  F/u urology at OP ?Nutrition - Renal diet w/fluid restrictions.  ?Hx A fib - on eliquis, toprol xl 12.5 qd. ?DMT2 - per PMD ?Access: RIJ TDC. Patient wishes to have her AVF/AVG done as an outpatient/post discharge which is reasonable ?Dispo: EDD 3/1 ? ?Nicole Lips, MD ?04/14/2021, 11:48 AM ? ? ? ?OP HD: [place]  ? 4h  400/600  166.5kg  Hep 2000 + 200u/hr  RIJ permcath  ? - Hep B SAg neg here on 2/14, Covid  neg 2/05 ? - had only 1 outpt Rx on 03/17/21 ? - no meds started ? ? ?Subjective:   ? ?In process of being qualified for triology.  For HD today.  Very happy with this outcome.  ? ?Objective:   ?BP 128/67 (BP Location: Left Arm)   Pulse 90   Temp 97.7 ?F (36.5 ?C) (Oral)   Resp 18   Ht 5\' 4"  (1.626 m)   Wt (!) 161.5 kg   LMP 07/17/2012   SpO2 97%   BMI 61.11 kg/m?  ? ?Intake/Output Summary (Last 24 hours) at 04/14/2021 1148 ?Last data filed at 04/13/2021 8413 ?Gross per 24 hour  ?Intake 356 ml  ?Output --  ?Net 356 ml  ? ?Weight change: 1 kg ? ?Physical Exam: ?Gen: alert ?CVS:rrr, s1s2 ?Resp: clear ?KGM:WNUUV, soft ?Ext: diffuse 1-2+  pitting edema b/l Les--> overall improved ?Neuro: alert, nonfocal ?Dialysis access: RIJ TDC c/d/i ? ?Imaging: ?No results found. ? ?Labs: ?BMET ?Recent Labs  ?Lab 04/07/21 ?1426 04/12/21 ?1409  ?NA 128* 130*  ?K 4.4 4.3  ?CL 92* 95*  ?CO2 26 26  ?GLUCOSE 122* 162*  ?BUN 56* 59*  ?CREATININE 5.29* 5.98*  ?CALCIUM 9.1 10.1  ?PHOS 4.8* 5.4*  ? ?CBC ?Recent Labs  ?Lab 04/07/21 ?1425 04/12/21 ?1409  ?WBC 10.3 11.0*  ?HGB 7.9* 8.9*  ?  HCT 26.3* 29.7*  ?MCV 94.9 96.7  ?PLT 301 275  ? ? ?Medications:   ? ? apixaban  5 mg Oral BID  ? atorvastatin  20 mg Oral QHS  ? bisacodyl  10 mg Rectal Daily  ? calcitRIOL  0.25 mcg Oral Daily  ? calcium carbonate  1,250 mg Oral Q breakfast  ? carbamide peroxide  5 drop Both EARS Daily  ? chlorhexidine  15 mL Mouth Rinse BID  ? Chlorhexidine Gluconate Cloth  6 each Topical Q0600  ? darbepoetin (ARANESP) injection - DIALYSIS  100 mcg Intravenous Q Thu-HD  ? diclofenac Sodium  2 g Topical QID  ? famotidine  10 mg Oral Daily  ? feeding supplement (NEPRO CARB STEADY)  237 mL Oral BID BM  ? folic acid  0,962 mcg Oral Daily  ? gabapentin  300 mg Oral QHS  ? Gerhardt's butt cream  3 application Topical TID  ? insulin aspart  0-15 Units Subcutaneous TID WC  ? levothyroxine  112 mcg Oral QAC breakfast  ? mouth rinse  15 mL Mouth Rinse q12n4p  ? midodrine  10 mg Oral  Q T,Th,Sa-HD  ? multivitamin  1 tablet Oral QHS  ? sevelamer carbonate  800 mg Oral TID WC  ? Vitamin D (Ergocalciferol)  50,000 Units Oral Weekly  ? ? ? ? ? ? ?

## 2021-04-14 NOTE — Progress Notes (Signed)
NAME:  Nicole Schmidt, MRN:  [id number], DOB:  07-24-1965, LOS: 72 ADMISSION DATE:  03/28/2021, CONSULTATION DATE:  04/14/21  REFERRING MD:  Rehab, CHIEF COMPLAINT:  OSA   History of Present Illness:  56 year old woman whom we are seeing in consultation for evaluation of OSA.  Patient was admitted 03/20/2021.  This is after extensive hospitalization in January 2023.  Discharged on 2/1 but represented to the ED 2/2 with worsening swelling, dyspnea.  Found to be hypoxemic.  Blood gas was notable for hypercarbia after somnolence.  Placed on BiPAP.  Mid to the hospital.  Received dialysis.  After several days was accepted to inpatient rehab.  Has been there since.  She is continue nightly CPAP use.  No major issues.  Ready for discharge from inpatient rehab.  However, she reports someone at the [facility] ICU told her "she would die if I did not wear BiPAP."  Unclear the contacts but she took this to mean she had to have BiPAP at home.  A long discussion regarding sleep disordered breathing OSA.  Long discussion of the rationale based on current weight similar to prior weight as well as only modest elevation of PCO2 off noninvasive positive ventilation on most recent blood gas 03/26/2021 that CPAP was adequate for treating her current OSA.  However she is not comfortably in discharge from the hospital based on what someone told her in the ICU.  On review this appears to be a hospitalist at [facility]  Pertinent  Medical History  ESRD on HD  Significant Hospital Events: Including procedures, antibiotic start and stop dates in addition to other pertinent events   04/13/2021 PCCM consult for OSA  Interim History / Subjective:  pCO2 elevated after bipap but with well compensated chronic respiratory acidosis  Objective   Blood pressure 128/67, pulse 90, temperature 97.7 F (36.5 C), temperature source Oral, resp. rate 18, height 5\' 4"  (1.626 m), weight (!) 161.5 kg, last menstrual period 07/17/2012, SpO2 97 %.         Intake/Output Summary (Last 24 hours) at 04/14/2021 0948 Last data filed at 04/13/2021 1855 Gross per 24 hour  Intake 356 ml  Output --  Net 356 ml    Filed Weights   04/12/21 1401 04/12/21 1747 04/14/21 0500  Weight: (!) 160.5 kg (!) 157 kg (!) 161.5 kg    Examination: General: Sitting in chair, no acute distress  eyes: EOMI, no icterus Neck: Supple, habitus precludes adequate JVP assessment Lungs: Normal work of breathing, on nasal cannula Cardiovascular: Warm, no edema noted Abdomen: Nondistended, bowel sounds present   Resolved Hospital Problem list     Assessment & Plan:   Sleep disordered breathing/OSA: Most recent polysomnography/CPAP titration 2021.  Currently auto titrating machine 5 to 20 cm of water at home.  Weight most recently same as was in 2021 at time of study.  Suspect CPAP is adequate for her breathing.  She is dismayed by being told at the [facility] ICU that she would die without BiPAP.  I suspect this was being referred to in the acute sense that she was hypercarbic and decompensated the time.  However, now that she is in her normal state of respiratory help see no reason why her current CPAP would not be effective. AM ABG after bipap with pCO2 > 55 on ABG. --This patient requires non-invasive ventilator for acute on chronic respiratory failure and COPD/OHS/OSA. Without ventilator, there is significant risk of untimely readmission to the hospital and increase on CO2 retention.  BIPAP is ineffective in the home setting in reducing CO2 due to the lack of an auto-rate. --Trilogy AVAPS tidal volume 500 cc, Pressure max 30 cm H2O, Pressure support max 20 cm H2O, Pressure support min 8 cm H2O, EPAP max 20 cm H2O, EPAP min 10 cm H2O, back up rate 12 breaths per minute    PCCM will sign off  Best Practice ( I am attesting your note in the abdomen the wrongright click and "Reselect all SmartList Selections" daily)   Per Primary  Labs   CBC: Recent Labs  Lab  04/07/21 1425 04/12/21 1409  WBC 10.3 11.0*  HGB 7.9* 8.9*  HCT 26.3* 29.7*  MCV 94.9 96.7  PLT 301 275     Basic Metabolic Panel: Recent Labs  Lab 04/07/21 1426 04/12/21 1409  NA 128* 130*  K 4.4 4.3  CL 92* 95*  CO2 26 26  GLUCOSE 122* 162*  BUN 56* 59*  CREATININE 5.29* 5.98*  CALCIUM 9.1 10.1  PHOS 4.8* 5.4*    GFR: Estimated Creatinine Clearance: 16 mL/min (A) (by C-G formula based on SCr of 5.98 mg/dL (H)). Recent Labs  Lab 04/07/21 1425 04/12/21 1409  WBC 10.3 11.0*     Liver Function Tests: Recent Labs  Lab 04/07/21 1426 04/12/21 1409  ALBUMIN 2.5* 2.8*    No results for input(s): LIPASE, AMYLASE in the last 168 hours. No results for input(s): AMMONIA in the last 168 hours.  ABG    Component Value Date/Time   PHART 7.3 (L) 04/14/2021 0710   PCO2ART 58 (H) 04/14/2021 0710   PO2ART 105 04/14/2021 0710   HCO3 28.5 (H) 04/14/2021 0710   TCO2 29.5 10/21/2014 1240   ACIDBASEDEF 0.8 03/26/2021 1419   O2SAT 97.5 04/14/2021 0710      Coagulation Profile: No results for input(s): INR, PROTIME in the last 168 hours.  Cardiac Enzymes: No results for input(s): CKTOTAL, CKMB, CKMBINDEX, TROPONINI in the last 168 hours.  HbA1C: Hgb A1c MFr Bld  Date/Time Value Ref Range Status  02/09/2021 03:43 PM 7.2 (H) 4.8 - 5.6 % Final    Comment:    (NOTE) Pre diabetes:          5.7%-6.4%  Diabetes:              >6.4%  Glycemic control for   <7.0% adults with diabetes   02/09/2020 01:40 PM 6.4 (H) 4.8 - 5.6 % Final    Comment:    (NOTE) Pre diabetes:          5.7%-6.4%  Diabetes:              >6.4%  Glycemic control for   <7.0% adults with diabetes     CBG: Recent Labs  Lab 04/13/21 0640 04/13/21 1141 04/13/21 1640 04/13/21 2117 04/14/21 0616  GLUCAP 105* 149* 91 115* 88     Review of Systems:   N/a  Past Medical History:  She,  has a past medical history of (HFpEF) heart failure with preserved ejection fraction ([place]), Acquired  hypothyroidism (02/09/2020), Anemia, Arthritis, Breast cancer ([place]) (2014), Breast cancer of upper-inner quadrant of right female breast ([place]), CHF (congestive heart failure) ([place]), CKD (chronic kidney disease), stage III ([place]), Diabetes mellitus type 2 in obese ([place]) (02/09/2020), Diabetes mellitus without complication ([place]), Dyslipidemia (02/09/2020), Essential hypertension, Gout, Hypothyroidism, Menopause, Morbid obesity ([place]), Persistent atrial fibrillation ([place]), Personal history of radiation therapy, Pulmonary embolism ([place]) (10/2014), and Thyroid goiter.   Surgical History:   Past Surgical History:  Procedure Laterality Date   BREAST BIOPSY Right 2014   breast ca   BREAST EXCISIONAL BIOPSY Right 07/19/2012   breast ca   BREAST SURGERY Right 2014   with sentinel node bx subareaolar duct excision   CARDIOVERSION N/A 11/28/2018   Procedure: CARDIOVERSION;  Surgeon: Minna Merritts, MD;  Location: [facility] ORS;  Service: Cardiovascular;  Laterality: N/A;   COLONOSCOPY WITH PROPOFOL N/A 01/30/2017   Procedure: COLONOSCOPY WITH PROPOFOL;  Surgeon: Christene Lye, MD;  Location: [facility] ENDOSCOPY;  Service: Endoscopy;  Laterality: N/A;   CYSTOSCOPY W/ URETERAL STENT PLACEMENT Left 02/14/2021   Procedure: CYSTOSCOPY WITH RETROGRADE PYELOGRAM/URETERAL STENT PLACEMENT;  Surgeon: Billey Co, MD;  Location: [facility] ORS;  Service: Urology;  Laterality: Left;   DIALYSIS/PERMA CATHETER INSERTION N/A 02/24/2021   Procedure: DIALYSIS/PERMA CATHETER INSERTION;  Surgeon: Algernon Huxley, MD;  Location: [facility];  Service: Cardiovascular;  Laterality: N/A;     Social History:   reports that she has never smoked. She has never used smokeless tobacco. She reports that she does not drink alcohol and does not use drugs.   Family History:  Her family history includes Atrial fibrillation in her mother; Colon cancer (age of onset: 36) in her cousin; Diabetes in her father; Heart attack in her  mother; Heart failure in her mother; Hypertension in her father and mother; Parkinson's disease in her father; Rheum arthritis in her mother. There is no history of Breast cancer.   Allergies No Known Allergies   Home Medications  Prior to Admission medications   Medication Sig Start Date End Date Taking? Authorizing Provider  acetaminophen (TYLENOL) 325 MG tablet Take 2 tablets (650 mg total) by mouth every 6 (six) hours as needed for mild pain (or Fever >/= 101). 03/15/21   Angiulli, Lavon Paganini, PA-C  albuterol (VENTOLIN HFA) 108 (90 Base) MCG/ACT inhaler Inhale 2 puffs into the lungs every 6 (six) hours as needed for wheezing or shortness of breath. 04/12/21   Angiulli, Lavon Paganini, PA-C  amLODipine (NORVASC) 5 MG tablet Take 1 tablet (5 mg total) by mouth daily. 03/16/21   Janalee Dane, PA-C  apixaban (ELIQUIS) 5 MG TABS tablet Take 1 tablet (5 mg total) by mouth 2 (two) times daily. 04/12/21   Angiulli, Lavon Paganini, PA-C  atorvastatin (LIPITOR) 20 MG tablet Take 1 tablet (20 mg total) by mouth at bedtime. 04/12/21   Angiulli, Lavon Paganini, PA-C  calcitRIOL (ROCALTROL) 0.25 MCG capsule Take 1 capsule (0.25 mcg total) by mouth daily. 04/12/21   Angiulli, Lavon Paganini, PA-C  calcium carbonate (OS-CAL - DOSED IN MG OF ELEMENTAL CALCIUM) 1250 (500 Ca) MG tablet Take 1 tablet (1,250 mg total) by mouth daily with breakfast. 04/12/21   Angiulli, Lavon Paganini, PA-C  calcium carbonate (OSCAL) 1500 (600 Ca) MG TABS tablet Take 1 tablet (1,500 mg total) by mouth daily. 03/15/21   Angiulli, Lavon Paganini, PA-C  diclofenac Sodium (VOLTAREN) 1 % GEL Apply 2 g topically 4 (four) times daily. 04/12/21   Angiulli, Lavon Paganini, PA-C  ergocalciferol (VITAMIN D2) 1.25 MG (50000 UT) capsule Take 1 capsule (50,000 Units total) by mouth once a week. 04/12/21   Angiulli, Lavon Paganini, PA-C  famotidine (PEPCID) 10 MG tablet Take 1 tablet (10 mg total) by mouth daily. 04/12/21   Angiulli, Lavon Paganini, PA-C  ferric gluconate 250 mg in sodium chloride 0.9 %  250 mL Inject 250 mg into the vein Every Tuesday,Thursday,and Saturday with dialysis. 04/12/21   Dexter, Lavon Paganini,  PA-C  folic acid (FOLVITE) 1 MG tablet Take 1 tablet (1 mg total) by mouth daily. 04/12/21   Angiulli, Lavon Paganini, PA-C  gabapentin (NEURONTIN) 300 MG capsule Take 1 capsule (300 mg total) by mouth at bedtime. 04/12/21   Angiulli, Lavon Paganini, PA-C  levothyroxine (SYNTHROID) 112 MCG tablet Take 1 tablet (112 mcg total) by mouth daily before breakfast. 04/12/21   Angiulli, Lavon Paganini, PA-C  melatonin 5 MG TABS Take 2 tablets (10 mg total) by mouth at bedtime as needed (sleep). 04/12/21   Angiulli, Lavon Paganini, PA-C  metoprolol succinate (TOPROL-XL) 25 MG 24 hr tablet Take 1 tablet (25 mg total) by mouth daily. 03/29/21   Fritzi Mandes, MD  midodrine (PROAMATINE) 10 MG tablet Take 1 tablet (10 mg total) by mouth Every Tuesday,Thursday,and Saturday with dialysis. 04/12/21   Angiulli, Lavon Paganini, PA-C  Multiple Vitamins-Minerals (CERTAVITE/ANTIOXIDANTS) TABS Take 1 tablet by mouth at bedtime. 03/28/21   Fritzi Mandes, MD  Nystatin (GERHARDT'S BUTT CREAM) CREA Apply 3 application topically 3 (three) times daily. 03/15/21   Angiulli, Lavon Paganini, PA-C  polyethylene glycol powder (GLYCOLAX/MIRALAX) 17 GM/SCOOP powder Take 1 capful (17 g) with water by mouth daily as needed for moderate constipation. 03/28/21   Fritzi Mandes, MD  sevelamer carbonate (RENVELA) 800 MG tablet Take 1 tablet (800 mg total) by mouth 3 (three) times daily with meals. 04/12/21   Angiulli, Lavon Paganini, PA-C  traMADol (ULTRAM) 50 MG tablet Take 1 tablet (50 mg total) by mouth every 6 (six) hours as needed for moderate pain. 03/28/21   Fritzi Mandes, MD  traMADol (ULTRAM) 50 MG tablet Take 1 tablet (50 mg total) by mouth every 12 (twelve) hours as needed for moderate pain. 04/12/21   Angiulli, Lavon Paganini, PA-C  fluticasone-salmeterol (ADVAIR HFA) 230-21 MCG/ACT inhaler Inhale 2 puffs into the lungs 2 (two) times daily as needed.  Patient not taking: No sig  reported  02/09/20  [provider]     Critical care time: n/a    Lanier Clam, MD See [facility] for contact info

## 2021-04-14 NOTE — Progress Notes (Signed)
Inpatient Rehabilitation Discharge Medication Review by a Pharmacist ? ?A complete drug regimen review was completed for this patient to identify any potential clinically significant medication issues. ? ?High Risk Drug Classes Is patient taking? Indication by Medication  ?Antipsychotic No   ?Anticoagulant Yes Apixaban- Hx PE  ?Antibiotic No   ?Opioid Yes Tramadol- acute pain  ?Antiplatelet No   ?Hypoglycemics/insulin No   ?Vasoactive Medication Yes Midodrine- hypotension associated with HD  ?Chemotherapy No   ?Other Yes Lipitor- HLD ?Rocaltrol- secondary hyperparathyroidism 2/2 ESRD ?Pepcid- GERD ?Gabapentin- neuropathic pain ?Synthroid- hypothyroidism ?Melatonin- sleep ?Renvela- hyperphosphatemia  ? ? ? ?Type of Medication Issue Identified Description of Issue Recommendation(s)  ?Drug Interaction(s) (clinically significant) ?    ?Duplicate Therapy ?    ?Allergy ?    ?No Medication Administration End Date ?    ?Incorrect Dose ?    ?Additional Drug Therapy Needed ?    ?Significant med changes from prior encounter (inform family/care partners about these prior to discharge).    ?Other ?    ? ? ?Clinically significant medication issues were identified that warrant physician communication and completion of prescribed/recommended actions by midnight of the next day:  No ? ?Time spent performing this drug regimen review (minutes):  30 ? ? ?Lindie Roberson BS, PharmD, BCPS ?Clinical Pharmacist ?04/14/2021 10:45 AM ? ? ? ? ?

## 2021-04-15 LAB — GLUCOSE, CAPILLARY: Glucose-Capillary: 89 mg/dL (ref 70–99)

## 2021-04-15 NOTE — Progress Notes (Signed)
?Nicole Schmidt ?Progress Note  ? ?Summary:  56 yo w/ hx breast cancer, anemia, CKD, DM2, HTN, gout, obesity, hx PE, afib on eliquis, ESRD recent start to HD on 02/25/21 at [facility]. Pt had repeat admits for a/c resp failure/ CHF and progression of CKD to ESRD needing dialysis. Last admit at [facility] on 03/17/21 for a/c CHF, resp failure and L ureteral stone sp stent placement and rx'd ESBL UTI w/ abx. Pt debilitated after all of this and was tx'd to [facility] for rehab on 03/28/21.  ? ? ?Assessment/ Plan:   ?Assessment/Plan: ? Debility - following prolonged hospitalizations.  In CIR.   ?Chronic respiratory failure/hypercapnic respiratory failure - on 2L O2 prior to last admission, uses bipap overnight.  Has BiPaP ?Acute resp failure - on 2/19 had emergent HD for resp distress. Resolved. ? ESRD -  on HD TTS. Next HD 04/16/21 as OP.  Using BiPap here in HD for naps- working well. ? Hypertension - BP's good today. OK to cont toprol xl 12.5 qd for afib. Getting midodrine 10 mg pre HD TTS.  ?Volume overload - persistent problem since recent start of HD in Jan. Wt's continue to drops still has sig LE edema. Cont to lower vol as tolerated.  ? Anemia of CKD - Hgb 8- 9 range here. SP Aranesp 100ug on 2/15, next dose today 2/23 then weekly on thursdays. Fe load started 2/21.  ? Secondary Hyperparathyroidism -  Calcium and phos in goal.  Continue VDRA.  renal diet, on revela. Cal trending up, consider holding os-cal ? Recent ESBL UTI/Hematuria/L ureteral stone-  sp stent placement on 02/14/21. Repeat US showed resolution of hydronephrosis.  F/u urology at OP ?Nutrition - Renal diet w/fluid restrictions.  ?Hx A fib - on eliquis, toprol xl 12.5 qd. ?DMT2 - per PMD ?Access: RIJ TDC. Patient wishes to have her AVF/AVG done as an outpatient/post discharge which is reasonable ?Dispo: EDD 3/1 ? ?Nicole Lips, MD ?04/15/2021, 11:56 AM ? ? ? ?OP HD: [place]  ? 4h  400/600  166.5kg  Hep 2000 + 200u/hr  RIJ permcath  ? -  Hep B SAg neg here on 2/14, Covid neg 2/05 ? - had only 1 outpt Rx on 03/17/21 ? - no meds started ? ? ?Subjective:   ? ?S/p HD yesterday with 3.7L off.  Doing well.  Getting ready to go home.    ? ?Objective:   ?BP 139/65 (BP Location: Left Arm)   Pulse 71   Temp 97.8 ?F (36.6 ?C) (Oral)   Resp 20   Ht 5\' 4"  (1.626 m)   Wt (!) 158 kg   LMP 07/17/2012   SpO2 100%   BMI 59.79 kg/m?  ? ?Intake/Output Summary (Last 24 hours) at 04/15/2021 1156 ?Last data filed at 04/15/2021 1610 ?Gross per 24 hour  ?Intake 420 ml  ?Output 3000 ml  ?Net -2580 ml  ? ?Weight change: 0.9 kg ? ?Physical Exam: ?Gen: alert ?CVS:rrr, s1s2 ?Resp: clear ?RUE:AVWUJ, soft ?Ext: diffuse 1-2+  pitting edema b/l Les--> overall improved ?Neuro: alert, nonfocal ?Dialysis access: RIJ TDC c/d/i ? ?Imaging: ?No results found. ? ?Labs: ?BMET ?Recent Labs  ?Lab 04/12/21 ?1409 04/14/21 ?1318  ?NA 130* 132*  ?K 4.3 4.1  ?CL 95* 94*  ?CO2 26 28  ?GLUCOSE 162* 125*  ?BUN 59* 46*  ?CREATININE 5.98* 5.14*  ?CALCIUM 10.1 9.5  ?PHOS 5.4* 5.1*  ? ?CBC ?Recent Labs  ?Lab 04/12/21 ?1409 04/14/21 ?1317  ?WBC  11.0* 8.6  ?HGB 8.9* 8.6*  ?HCT 29.7* 30.4*  ?MCV 96.7 98.7  ?PLT 275 203  ? ? ?Medications:   ? ? apixaban  5 mg Oral BID  ? atorvastatin  20 mg Oral QHS  ? bisacodyl  10 mg Rectal Daily  ? calcitRIOL  0.25 mcg Oral Daily  ? calcium carbonate  1,250 mg Oral Q breakfast  ? carbamide peroxide  5 drop Both EARS Daily  ? chlorhexidine  15 mL Mouth Rinse BID  ? Chlorhexidine Gluconate Cloth  6 each Topical Q0600  ? darbepoetin (ARANESP) injection - DIALYSIS  100 mcg Intravenous Q Thu-HD  ? diclofenac Sodium  2 g Topical QID  ? famotidine  10 mg Oral Daily  ? feeding supplement (NEPRO CARB STEADY)  237 mL Oral BID BM  ? folic acid  3,785 mcg Oral Daily  ? gabapentin  300 mg Oral QHS  ? Gerhardt's butt cream  3 application Topical TID  ? insulin aspart  0-15 Units Subcutaneous TID WC  ? levothyroxine  112 mcg Oral QAC breakfast  ? mouth rinse  15 mL Mouth Rinse q12n4p   ? midodrine  10 mg Oral Q T,Th,Sa-HD  ? multivitamin  1 tablet Oral QHS  ? sevelamer carbonate  800 mg Oral TID WC  ? Vitamin D (Ergocalciferol)  50,000 Units Oral Weekly  ? ? ? ? ? ? ?

## 2021-04-15 NOTE — Progress Notes (Signed)
Patient discharged to home with niece. Patient stated that they understood all discharge instructions. ?Sanda Linger, LPN ? ?

## 2021-04-15 NOTE — Progress Notes (Signed)
[place] and spoke to [place]. Clinic advised that pt will indeed d/c today and will resume tomorrow. Clinic also advised that d/c summary is not available at this time but navigator will fax as soon as it is available.  ? ?Melven Sartorius ?Renal Navigator ?[phone] ?

## 2021-04-15 NOTE — Progress Notes (Signed)
Patient ID: Nicole Schmidt, female   DOB: 03/25/1965, 56 y.o.   MRN: [id number] ? ?This SW covering for primary SW, Erlene Quan. ? ?SW spoke with [place] who reported Bi-Pap approved through insurance, and the Respiratory Therapist Marya Amsler will call about getting set up at home.  ? ?SW spoke with pt cousin Manuela Schwartz [phone]) to inform on above. Will be here around 11:45a/12pm to pick up patient. SW met with pt in room to share information. Pt had received phone call from [place] about this as well. Medical team updated. ? ?Loralee Pacas, MSW, LCSWA ?Office: [phone] ?Cell: [phone] ?Fax: [phone]  ?

## 2021-04-17 DIAGNOSIS — G4733 Obstructive sleep apnea (adult) (pediatric): Secondary | ICD-10-CM | POA: Diagnosis not present

## 2021-04-17 DIAGNOSIS — J9601 Acute respiratory failure with hypoxia: Secondary | ICD-10-CM | POA: Diagnosis not present

## 2021-04-17 DIAGNOSIS — I482 Chronic atrial fibrillation, unspecified: Secondary | ICD-10-CM | POA: Diagnosis not present

## 2021-04-17 DIAGNOSIS — I5032 Chronic diastolic (congestive) heart failure: Secondary | ICD-10-CM | POA: Diagnosis not present

## 2021-04-18 ENCOUNTER — Other Ambulatory Visit (HOSPITAL_COMMUNITY): Payer: Self-pay

## 2021-04-18 NOTE — Progress Notes (Signed)
Late Entry Note: ? ?D/C summary faxed to [facility] for continuation of care (fax# [phone]).  ? ?Melven Sartorius ?Renal Navigator ?[phone] ?

## 2021-04-20 DIAGNOSIS — N200 Calculus of kidney: Secondary | ICD-10-CM | POA: Diagnosis not present

## 2021-04-20 DIAGNOSIS — R059 Cough, unspecified: Secondary | ICD-10-CM | POA: Diagnosis not present

## 2021-04-20 DIAGNOSIS — J9610 Chronic respiratory failure, unspecified whether with hypoxia or hypercapnia: Secondary | ICD-10-CM | POA: Diagnosis not present

## 2021-04-20 DIAGNOSIS — J019 Acute sinusitis, unspecified: Secondary | ICD-10-CM | POA: Diagnosis not present

## 2021-04-20 DIAGNOSIS — N184 Chronic kidney disease, stage 4 (severe): Secondary | ICD-10-CM | POA: Diagnosis not present

## 2021-04-20 DIAGNOSIS — J449 Chronic obstructive pulmonary disease, unspecified: Secondary | ICD-10-CM | POA: Diagnosis not present

## 2021-04-20 DIAGNOSIS — R0602 Shortness of breath: Secondary | ICD-10-CM | POA: Diagnosis not present

## 2021-04-20 DIAGNOSIS — J988 Other specified respiratory disorders: Secondary | ICD-10-CM | POA: Diagnosis not present

## 2021-04-20 DIAGNOSIS — R0989 Other specified symptoms and signs involving the circulatory and respiratory systems: Secondary | ICD-10-CM | POA: Diagnosis not present

## 2021-04-20 DIAGNOSIS — R918 Other nonspecific abnormal finding of lung field: Secondary | ICD-10-CM | POA: Diagnosis not present

## 2021-04-20 DIAGNOSIS — I878 Other specified disorders of veins: Secondary | ICD-10-CM | POA: Diagnosis not present

## 2021-04-24 ENCOUNTER — Emergency Department: Payer: BC Managed Care – PPO

## 2021-04-24 ENCOUNTER — Observation Stay
Admission: EM | Admit: 2021-04-24 | Discharge: 2021-04-25 | Disposition: A | Discharge status: Home Health | Payer: BC Managed Care – PPO | Attending: Internal Medicine | Admitting: Internal Medicine

## 2021-04-24 DIAGNOSIS — I482 Chronic atrial fibrillation, unspecified: Secondary | ICD-10-CM | POA: Diagnosis not present

## 2021-04-24 DIAGNOSIS — G4489 Other headache syndrome: Secondary | ICD-10-CM | POA: Diagnosis not present

## 2021-04-24 DIAGNOSIS — E039 Hypothyroidism, unspecified: Secondary | ICD-10-CM | POA: Diagnosis not present

## 2021-04-24 DIAGNOSIS — J9611 Chronic respiratory failure with hypoxia: Principal | ICD-10-CM | POA: Insufficient documentation

## 2021-04-24 DIAGNOSIS — I10 Essential (primary) hypertension: Secondary | ICD-10-CM | POA: Diagnosis present

## 2021-04-24 DIAGNOSIS — R0602 Shortness of breath: Secondary | ICD-10-CM | POA: Diagnosis not present

## 2021-04-24 DIAGNOSIS — R778 Other specified abnormalities of plasma proteins: Secondary | ICD-10-CM | POA: Insufficient documentation

## 2021-04-24 DIAGNOSIS — R519 Headache, unspecified: Secondary | ICD-10-CM | POA: Insufficient documentation

## 2021-04-24 DIAGNOSIS — Z7901 Long term (current) use of anticoagulants: Secondary | ICD-10-CM | POA: Insufficient documentation

## 2021-04-24 DIAGNOSIS — I5030 Unspecified diastolic (congestive) heart failure: Secondary | ICD-10-CM | POA: Insufficient documentation

## 2021-04-24 DIAGNOSIS — I214 Non-ST elevation (NSTEMI) myocardial infarction: Secondary | ICD-10-CM | POA: Insufficient documentation

## 2021-04-24 DIAGNOSIS — G4733 Obstructive sleep apnea (adult) (pediatric): Secondary | ICD-10-CM | POA: Diagnosis not present

## 2021-04-24 DIAGNOSIS — I517 Cardiomegaly: Secondary | ICD-10-CM | POA: Diagnosis not present

## 2021-04-24 DIAGNOSIS — I120 Hypertensive chronic kidney disease with stage 5 chronic kidney disease or end stage renal disease: Secondary | ICD-10-CM | POA: Diagnosis not present

## 2021-04-24 DIAGNOSIS — R069 Unspecified abnormalities of breathing: Secondary | ICD-10-CM | POA: Diagnosis not present

## 2021-04-24 DIAGNOSIS — I132 Hypertensive heart and chronic kidney disease with heart failure and with stage 5 chronic kidney disease, or end stage renal disease: Secondary | ICD-10-CM | POA: Insufficient documentation

## 2021-04-24 DIAGNOSIS — I4891 Unspecified atrial fibrillation: Secondary | ICD-10-CM | POA: Diagnosis not present

## 2021-04-24 DIAGNOSIS — Z20822 Contact with and (suspected) exposure to covid-19: Secondary | ICD-10-CM | POA: Insufficient documentation

## 2021-04-24 DIAGNOSIS — N186 End stage renal disease: Secondary | ICD-10-CM | POA: Diagnosis not present

## 2021-04-24 DIAGNOSIS — E669 Obesity, unspecified: Secondary | ICD-10-CM | POA: Diagnosis present

## 2021-04-24 DIAGNOSIS — E1122 Type 2 diabetes mellitus with diabetic chronic kidney disease: Secondary | ICD-10-CM | POA: Diagnosis not present

## 2021-04-24 DIAGNOSIS — Z7951 Long term (current) use of inhaled steroids: Secondary | ICD-10-CM | POA: Diagnosis not present

## 2021-04-24 DIAGNOSIS — Z992 Dependence on renal dialysis: Secondary | ICD-10-CM | POA: Diagnosis not present

## 2021-04-24 DIAGNOSIS — R0902 Hypoxemia: Secondary | ICD-10-CM | POA: Diagnosis not present

## 2021-04-24 DIAGNOSIS — R7989 Other specified abnormal findings of blood chemistry: Secondary | ICD-10-CM | POA: Diagnosis present

## 2021-04-24 DIAGNOSIS — R739 Hyperglycemia, unspecified: Secondary | ICD-10-CM | POA: Diagnosis not present

## 2021-04-24 DIAGNOSIS — E785 Hyperlipidemia, unspecified: Secondary | ICD-10-CM | POA: Diagnosis present

## 2021-04-24 DIAGNOSIS — Z79899 Other long term (current) drug therapy: Secondary | ICD-10-CM | POA: Insufficient documentation

## 2021-04-24 DIAGNOSIS — E1169 Type 2 diabetes mellitus with other specified complication: Secondary | ICD-10-CM | POA: Diagnosis present

## 2021-04-24 LAB — COMPREHENSIVE METABOLIC PANEL
ALT: 25 U/L (ref 0–44)
AST: 28 U/L (ref 15–41)
Albumin: 3.1 g/dL — ABNORMAL LOW (ref 3.5–5.0)
Alkaline Phosphatase: 80 U/L (ref 38–126)
Anion gap: 13 (ref 5–15)
BUN: 39 mg/dL — ABNORMAL HIGH (ref 6–20)
CO2: 28 mmol/L (ref 22–32)
Calcium: 9 mg/dL (ref 8.9–10.3)
Chloride: 96 mmol/L — ABNORMAL LOW (ref 98–111)
Creatinine, Ser: 3.13 mg/dL — ABNORMAL HIGH (ref 0.44–1.00)
GFR, Estimated: 17 mL/min — ABNORMAL LOW (ref >60)
Glucose, Bld: 194 mg/dL — ABNORMAL HIGH (ref 70–99)
Potassium: 3.1 mmol/L — ABNORMAL LOW (ref 3.5–5.1)
Sodium: 137 mmol/L (ref 135–145)
Total Bilirubin: 0.3 mg/dL (ref 0.3–1.2)
Total Protein: 6.8 g/dL (ref 6.5–8.1)

## 2021-04-24 LAB — BLOOD GAS, VENOUS
Acid-Base Excess: 1.7 mmol/L (ref 0.0–2.0)
Bicarbonate: 30.1 mmol/L — ABNORMAL HIGH (ref 20.0–28.0)
O2 Saturation: 57 %
Patient temperature: 37
pCO2, Ven: 64 mmHg — ABNORMAL HIGH (ref 44–60)
pH, Ven: 7.28 (ref 7.25–7.43)
pO2, Ven: 35 mmHg (ref 32–45)

## 2021-04-24 LAB — CBC
HCT: 37.9 % (ref 36.0–46.0)
Hemoglobin: 11 g/dL — ABNORMAL LOW (ref 12.0–15.0)
MCH: 28.1 pg (ref 26.0–34.0)
MCHC: 29 g/dL — ABNORMAL LOW (ref 30.0–36.0)
MCV: 96.7 fL (ref 80.0–100.0)
Platelets: 248 10*3/uL (ref 150–400)
RBC: 3.92 MIL/uL (ref 3.87–5.11)
RDW: 19 % — ABNORMAL HIGH (ref 11.5–15.5)
WBC: 18.9 10*3/uL — ABNORMAL HIGH (ref 4.0–10.5)
nRBC: 0.2 % (ref 0.0–0.2)

## 2021-04-24 LAB — RESP PANEL BY RT-PCR (FLU A&B, COVID) ARPGX2
Influenza A by PCR: NEGATIVE
Influenza B by PCR: NEGATIVE
SARS Coronavirus 2 by RT PCR: NEGATIVE

## 2021-04-24 LAB — TROPONIN I (HIGH SENSITIVITY)
Troponin I (High Sensitivity): 79 ng/L — ABNORMAL HIGH (ref <18)
Troponin I (High Sensitivity): 149 ng/L (ref <18)

## 2021-04-24 LAB — BRAIN NATRIURETIC PEPTIDE: B Natriuretic Peptide: 634.8 pg/mL — ABNORMAL HIGH (ref 0.0–100.0)

## 2021-04-24 MED ORDER — GABAPENTIN 300 MG PO CAPS
300.0000 mg | ORAL_CAPSULE | Freq: Every day | ORAL | Status: DC
Start: 2021-04-24 — End: 2021-04-25
  Administered 2021-04-24: 300 mg via ORAL
  Filled 2021-04-24: qty 1

## 2021-04-24 MED ORDER — ACETAMINOPHEN 325 MG RE SUPP: 650.0000 mg | Freq: Four times a day (QID) | RECTAL | Status: DC | PRN

## 2021-04-24 MED ORDER — ACETAMINOPHEN 325 MG PO TABS
650.0000 mg | ORAL_TABLET | Freq: Four times a day (QID) | ORAL | Status: DC | PRN
  Administered 2021-04-24 – 2021-04-25 (×2): 650 mg via ORAL
  Filled 2021-04-24 (×2): qty 2

## 2021-04-24 MED ORDER — LEVOTHYROXINE SODIUM 112 MCG PO TABS
112.0000 ug | ORAL_TABLET | Freq: Every day | ORAL | Status: DC
  Administered 2021-04-25: 112 ug via ORAL
  Filled 2021-04-24 (×2): qty 1

## 2021-04-24 MED ORDER — APIXABAN 5 MG PO TABS
5.0000 mg | ORAL_TABLET | Freq: Two times a day (BID) | ORAL | Status: DC
  Administered 2021-04-24 – 2021-04-25 (×2): 5 mg via ORAL
  Filled 2021-04-24 (×2): qty 1

## 2021-04-24 MED ORDER — FAMOTIDINE 20 MG PO TABS
10.0000 mg | ORAL_TABLET | Freq: Every day | ORAL | Status: DC
  Administered 2021-04-25: 10 mg via ORAL
  Filled 2021-04-24: qty 1

## 2021-04-24 MED ORDER — ACETAMINOPHEN 500 MG PO TABS
1000.0000 mg | ORAL_TABLET | Freq: Once | ORAL | Status: AC
  Administered 2021-04-24: 1000 mg via ORAL
  Filled 2021-04-24: qty 2

## 2021-04-24 MED ORDER — CALCIUM CARBONATE 1250 (500 CA) MG PO TABS
1250.0000 mg | ORAL_TABLET | Freq: Every day | ORAL | Status: DC
  Administered 2021-04-25: 1250 mg via ORAL
  Filled 2021-04-24: qty 1

## 2021-04-24 MED ORDER — ADULT MULTIVITAMIN W/MINERALS CH
1.0000 | ORAL_TABLET | Freq: Every day | ORAL | Status: DC
  Administered 2021-04-24: 1 via ORAL
  Filled 2021-04-24: qty 1

## 2021-04-24 MED ORDER — TRAMADOL HCL 50 MG PO TABS
50.0000 mg | ORAL_TABLET | Freq: Two times a day (BID) | ORAL | Status: DC | PRN
Start: 2021-04-24 — End: 2021-04-25

## 2021-04-24 MED ORDER — METOPROLOL SUCCINATE ER 25 MG PO TB24
12.5000 mg | ORAL_TABLET | Freq: Once | ORAL | Status: AC
  Administered 2021-04-24: 12.5 mg via ORAL
  Filled 2021-04-24: qty 0.5

## 2021-04-24 MED ORDER — POTASSIUM CHLORIDE CRYS ER 20 MEQ PO TBCR
20.0000 meq | EXTENDED_RELEASE_TABLET | Freq: Once | ORAL | Status: AC
  Administered 2021-04-24: 20 meq via ORAL
  Filled 2021-04-24: qty 1

## 2021-04-24 MED ORDER — CALCITRIOL 0.25 MCG PO CAPS
0.2500 ug | ORAL_CAPSULE | Freq: Every day | ORAL | Status: DC
  Administered 2021-04-25: 0.25 ug via ORAL
  Filled 2021-04-24: qty 1

## 2021-04-24 MED ORDER — SEVELAMER CARBONATE 800 MG PO TABS
800.0000 mg | ORAL_TABLET | Freq: Three times a day (TID) | ORAL | Status: DC
  Administered 2021-04-25 (×2): 800 mg via ORAL
  Filled 2021-04-24 (×3): qty 1

## 2021-04-24 MED ORDER — POTASSIUM CHLORIDE CRYS ER 20 MEQ PO TBCR: 40.0000 meq | EXTENDED_RELEASE_TABLET | Freq: Once | ORAL | Status: DC

## 2021-04-24 MED ORDER — ALBUTEROL SULFATE (2.5 MG/3ML) 0.083% IN NEBU: 3.0000 mL | INHALATION_SOLUTION | Freq: Four times a day (QID) | RESPIRATORY_TRACT | Status: DC | PRN

## 2021-04-24 MED ORDER — FOLIC ACID 1 MG PO TABS
1000.0000 ug | ORAL_TABLET | Freq: Every day | ORAL | Status: DC
  Administered 2021-04-24 – 2021-04-25 (×2): 1 mg via ORAL
  Filled 2021-04-24 (×2): qty 1

## 2021-04-24 MED ORDER — POLYETHYLENE GLYCOL 3350 17 G PO PACK: 17.0000 g | PACK | Freq: Every day | ORAL | Status: DC | PRN

## 2021-04-24 MED ORDER — ATORVASTATIN CALCIUM 20 MG PO TABS
20.0000 mg | ORAL_TABLET | Freq: Every day | ORAL | Status: DC
  Administered 2021-04-24: 20 mg via ORAL
  Filled 2021-04-24: qty 1

## 2021-04-24 MED ORDER — METOPROLOL SUCCINATE ER 50 MG PO TB24
25.0000 mg | ORAL_TABLET | Freq: Every day | ORAL | Status: DC
  Administered 2021-04-24 – 2021-04-25 (×2): 25 mg via ORAL
  Filled 2021-04-24 (×2): qty 1

## 2021-04-24 MED ORDER — MELATONIN 5 MG PO TABS: 10.0000 mg | ORAL_TABLET | Freq: Every evening | ORAL | Status: DC | PRN

## 2021-04-24 NOTE — ED Notes (Signed)
Patient resting comfortably on stretcher in room. RR even and unlabored. Patient on the phone trying to get a hold of her friend. Patient has not urinated yet but states she doesn't produce much as a renal patient. Patient verbalizes no needs or complaints at this time. ?

## 2021-04-24 NOTE — ED Provider Notes (Signed)
[facility] Provider Note    Event Date/Time   First MD Initiated Contact with Patient 04/24/21 1501     (approximate)   History   Hypoxia (Pt from home where family found her in her wheelchair asleep without her breathing machine and was confused upon waking. Called EMS for concern for elevated CO2. Pt recently hospitalized for CHF and developed kidney problems, started on dialysis. Last dialysis was yesterday. 86% room air on arrival. Pt A&O on arrival. )   HPI  Nicole Schmidt is a 56 y.o. female   right-handed female with history of chronic hypoxemic respiratory failure on 2 L oxygen diabetes mellitus breast cancer history of hematuria left ureteral stone with recent left uteroscopy with laser lithotripsy and stent exchange per Dr.Sninsky, hypertension, hyperlipidemia, morbid obesity BMI 62.17 atrial fibrillation pulmonary emboli on Eliquis, ESRD who comes in with hypoxia who comes in with concerns for low oxygen levels.  Patient reportedly last had dialysis yesterday.  Patient reports that she was in the rehab facility over at [facility] for a few weeks but then discharged home on Friday.  She reports that she lives at home and that she has been getting around with a walker using her trilogy at nighttime however last night she states she is not sure what happens but she did not have her trilogy mask on and then when she tried to put it back on she could not get it on fully.  When a friend tried to call her multiple times and did not get any answer they went to her house and found her sitting in her wheelchair and the trilogy mask was not on her face and there was concern for CO2 retention.  Her niece then showed up and with time she became acting her normal self and is currently acting her normal self alert and oriented.  She reports that she has not taken any of her medications yet this morning.  She denies any chest pain, shortness of breath, abdominal pain, worsening  swelling.  She reports that she is having a little bit of a headache      Physical Exam   Triage Vital Signs: ED Triage Vitals  Enc Vitals Group     BP 04/24/21 1430 115/64     Pulse Rate 04/24/21 1426 (!) 118     Resp 04/24/21 1426 20     Temp 04/24/21 1426 98 F (36.7 C)     Temp src --      SpO2 04/24/21 1425 96 %     Weight 04/24/21 1429 (!) 348 lb 5.2 oz (158 kg)     Height 04/24/21 1429 '5\' 4"'$  (1.626 m)     Head Circumference --      Peak Flow --      Pain Score 04/24/21 1428 6     Pain Loc --      Pain Edu? --      Excl. in [place]? --     Most recent vital signs: Vitals:   04/24/21 1426 04/24/21 1430  BP:  115/64  Pulse: (!) 118   Resp: 20   Temp: 98 F (36.7 C)   SpO2: 96%      General: Awake, no distress. Elevated BMI.  Dried blood noted on her face without any tongue laceration. CV:  Good peripheral perfusion. Irregular  Resp:  Normal effort.  Abd:  No distention.  Other:  No significant leg swelling.  ED Results / Procedures /  Treatments   Labs (all labs ordered are listed, but only abnormal results are displayed) Labs Reviewed  CBC  COMPREHENSIVE METABOLIC PANEL  BLOOD GAS, VENOUS  BRAIN NATRIURETIC PEPTIDE  TROPONIN I (HIGH SENSITIVITY)     EKG  My interpretation of EKG:  Atrial fibrillation rate of 101, no ST elevation or T wave inversions, grossly normal intervals  RADIOLOGY I have reviewed the xray personally and agree with radiology read some edema bilaterally    PROCEDURES:  Critical Care performed: No  .1-3 Lead EKG Interpretation Performed by: Vanessa [place], MD Authorized by: Vanessa Crouch, MD     Interpretation: abnormal     ECG rate:  100   ECG rate assessment: tachycardic     Rhythm: atrial fibrillation     Ectopy: none     Conduction: normal     MEDICATIONS ORDERED IN ED: Medications  potassium chloride SA (KLOR-CON M) CR tablet 20 mEq (has no administration in time range)  acetaminophen (TYLENOL) tablet 650 mg  (has no administration in time range)    Or  acetaminophen (TYLENOL) suppository 650 mg (has no administration in time range)  polyethylene glycol (MIRALAX / GLYCOLAX) packet 17 g (has no administration in time range)  albuterol (PROVENTIL) (2.5 MG/3ML) 0.083% nebulizer solution 3 mL (has no administration in time range)  apixaban (ELIQUIS) tablet 5 mg (has no administration in time range)  atorvastatin (LIPITOR) tablet 20 mg (has no administration in time range)  calcitRIOL (ROCALTROL) capsule 0.25 mcg (has no administration in time range)  calcium carbonate (OS-CAL - dosed in mg of elemental calcium) tablet 1,250 mg (has no administration in time range)  famotidine (PEPCID) tablet 10 mg (has no administration in time range)  folic acid (FOLVITE) tablet 1 mg (has no administration in time range)  gabapentin (NEURONTIN) capsule 300 mg (has no administration in time range)  levothyroxine (SYNTHROID) tablet 112 mcg (has no administration in time range)  melatonin tablet 10 mg (has no administration in time range)  multivitamin with minerals tablet 1 tablet (has no administration in time range)  sevelamer carbonate (RENVELA) tablet 800 mg (has no administration in time range)  traMADol (ULTRAM) tablet 50 mg (has no administration in time range)  metoprolol succinate (TOPROL-XL) 24 hr tablet 25 mg (has no administration in time range)  acetaminophen (TYLENOL) tablet 1,000 mg (1,000 mg Oral Given 04/24/21 1523)  metoprolol succinate (TOPROL-XL) 24 hr tablet 12.5 mg (12.5 mg Oral Given 04/24/21 1620)     IMPRESSION / MDM / ASSESSMENT AND PLAN / ED COURSE  I reviewed the triage vital signs and the nursing notes.                              Patient with significant medical history as stated above who comes in with concerns for altered mental status this morning the setting of not using her trilogy.  Patient is currently alert and oriented x3.  She is slightly tachycardic in atrial fibrillation I  will give her her home dose of metoprolol that she missed this morning.  Will get labs to evaluate for ACS, fluid overload, CO2 retention.  Given patient reporting a headache also get a CT head evaluate for any intercranial hemorrhage however she is adamant she did not have any falls out of the chair and has no C-spine tenderness.  She does have some dried blood on her face I suspect that she could have bitten her  tongue although no large laceration noted.  She denies any vomiting blood.  She denies any black tarry stools.  We will check hemoglobin  VBG shows elevated CO2 but only slightly and pH is normal patient has normal mentation do not feel like she needs BiPAP at this time.  Her BNP is slightly elevated Her CBC shows elevated white count but she denies any infectious symptoms Her troponin is uptrending from 79-149.  I reviewed back in patient's had normal cardiac markers in the setting of ESRD this is the first time that she has had a bump in her troponins.  It could just be demand from the event overnight but given patient's comorbidities I feel like it would be prudent to trend these overnight get repeat echocardiogram.  Discussed with patient that given her ESRD they are not likely to jump into doing a catheterization or anything like that but after discussion further with family they felt much more comfortable patient staying overnight and being discharged home with cardiology follow-up outpatient.  Her CMP shows elevated creatinine but patient is on no dialysis.  Given this we will discussed with the hospital team for admission for cardiac monitoring.  I have decided to hold off on heparin after discussion with the hospitalist given patient is already on Eliquis and she is not having any chest pain at this time and given the blood was found on her face I want to make sure that we are monitoring for any bleeding.  Her hemoglobin initially was stable at 11   The patient is on the cardiac monitor  to evaluate for evidence of arrhythmia and/or significant heart rate changes.     FINAL CLINICAL IMPRESSION(S) / ED DIAGNOSES   Final diagnoses:  NSTEMI (non-ST elevated myocardial infarction) ([place])  ESRD (end stage renal disease) ([place])     Rx / DC Orders   ED Discharge Orders     None        Note:  This document was prepared using Dragon voice recognition software and may include unintentional dictation errors.   Vanessa , MD 04/24/21 [phone]

## 2021-04-24 NOTE — Assessment & Plan Note (Addendum)
Blood pressure mildly elevated. ?Patient was discharged on metoprolol and amlodipine during prior hospitalization, but stating that she was not taking antihypertensives due to softer blood pressure during dialysis. ?-Continue metoprolol ?-Holding amlodipine-will restart if needed ?-Continue to monitor ?-Continue midodrine on Tuesday, Thursday and Saturday ?

## 2021-04-24 NOTE — Assessment & Plan Note (Signed)
-   Continue home Synthroid °

## 2021-04-24 NOTE — Assessment & Plan Note (Signed)
EKG with A-fib, initially some RVR which has been improved after taking a small dose of metoprolol in ED. ?-Continue with Eliquis ?

## 2021-04-24 NOTE — Assessment & Plan Note (Signed)
-  Continue Lipitor °

## 2021-04-24 NOTE — Assessment & Plan Note (Signed)
   BiPAP at night 

## 2021-04-24 NOTE — H&P (Addendum)
History and Physical    Patient: Nicole Schmidt PIR:[id number] DOB: 06/21/1965 DOA: 04/24/2021 DOS: the patient was seen and examined on 04/24/2021 PCP: Lenard Simmer, MD  Patient coming from: Home  Chief Complaint:  Chief Complaint  Patient presents with   Hypoxia    Pt from home where family found her in her wheelchair asleep without her breathing machine and was confused upon waking. Called EMS for concern for elevated CO2. Pt recently hospitalized for CHF and developed kidney problems, started on dialysis. Last dialysis was yesterday. 86% room air on arrival. Pt A&O on arrival.    HPI: Nicole Schmidt is a 56 y.o. 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 Apparently patient did place Trelegy on, when she woke up it was off, she was not sure when she took the mask off, cousin got concerned when she was not picking up the phone so she came to check on her and found her confused, there was some concern of CO2 retention so she was brought to ED for further evaluation.  Patient denies any shortness of breath, chest pain, fever, chills, nausea, vomiting, diarrhea, abdominal pain or urinary symptoms.  She saw her PCP last week for concern of some cough and congestion and was given a course of antibiotics and prednisone. Per patient she is compliant with all of her medications.  Patient was recently admitted in hospital with acute on chronic hypercapnic respiratory failure and during that time she was also diagnosed with nephrolithiasis s/p ureteral stent and an ESBL UTI which was treated with IV meropenem.  Patient was supposed to follow-up with urology as an outpatient, she has her upcoming appointment next week on Wednesday.  No urinary  concerns at this time.  Patient was discharged to rehab and came home approximately 10 days ago.  ED course.  She was hemodynamically stable, alert and oriented x3, saturating well on her baseline oxygen requirement of 2 to 3 L, venous blood gas with CO2 of 64, labs pertinent for leukocytosis at 18.9, hemoglobin of 11 and platelets of 248, all cell lines increased from her recent labs, may be some hemoconcentration, troponin at 79 which increased to 149 on subsequent check, EKG with A-fib which seems chronic and no acute ST changes, CT head was without any acute changes and chest x-ray with mild pulmonary interstitial prominence.  TRH was asked for admission due to elevated troponin.   Review of Systems: As mentioned in the history of present illness. All other systems reviewed and are negative. Past Medical History:  Diagnosis Date   (HFpEF) heart failure with preserved ejection fraction ([place])    a. 10/2018 Echo: EF 60-65%, diast dysfxn, RVSP 50.7 mmHg, mildly dil LA.   Acquired hypothyroidism 02/09/2020   Anemia    Arthritis    Breast cancer ([place]) 2014   right breast cancer   Breast cancer of upper-inner quadrant of right female breast ([place])    Right breast invasive CA and DCIS , 7 mm T1,N0,M0. Er/PR pos, her 2 negative.  Margins 1 mm.   CHF (congestive heart failure) (HCC)    CKD (chronic kidney disease), stage III ([place])    Diabetes mellitus type 2 in obese ([place]) 02/09/2020   Diabetes mellitus without complication ([place])    Dyslipidemia 02/09/2020  Essential hypertension    Gout    Hypothyroidism    Menopause    age 20   Morbid obesity ([place])    Persistent atrial fibrillation ([place])    a. Dx 10/2018 in setting of PNA. CHA2DS2VASc = 4-->Eliquis; b. 11/2018 s/p successful DCCV.   Personal history of radiation therapy    Pulmonary embolism ([place]) 10/2014   a. Chronic eliquis.   Thyroid goiter    Past Surgical History:  Procedure Laterality Date   BREAST BIOPSY Right 2014   breast ca    BREAST EXCISIONAL BIOPSY Right 07/19/2012   breast ca   BREAST SURGERY Right 2014   with sentinel node bx subareaolar duct excision   CARDIOVERSION N/A 11/28/2018   Procedure: CARDIOVERSION;  Surgeon: Minna Merritts, MD;  Location: [facility] ORS;  Service: Cardiovascular;  Laterality: N/A;   COLONOSCOPY WITH PROPOFOL N/A 01/30/2017   Procedure: COLONOSCOPY WITH PROPOFOL;  Surgeon: Christene Lye, MD;  Location: [facility] ENDOSCOPY;  Service: Endoscopy;  Laterality: N/A;   CYSTOSCOPY W/ URETERAL STENT PLACEMENT Left 02/14/2021   Procedure: CYSTOSCOPY WITH RETROGRADE PYELOGRAM/URETERAL STENT PLACEMENT;  Surgeon: Billey Co, MD;  Location: [facility] ORS;  Service: Urology;  Laterality: Left;   DIALYSIS/PERMA CATHETER INSERTION N/A 02/24/2021   Procedure: DIALYSIS/PERMA CATHETER INSERTION;  Surgeon: Algernon Huxley, MD;  Location: [facility];  Service: Cardiovascular;  Laterality: N/A;   Social History:  reports that she has never smoked. She has never used smokeless tobacco. She reports that she does not drink alcohol and does not use drugs.  No Known Allergies  Family History  Problem Relation Age of Onset   Diabetes Father    Hypertension Father    Parkinson's disease Father    Hypertension Mother    Rheum arthritis Mother    Atrial fibrillation Mother    Heart failure Mother    Heart attack Mother        died in her mid-70's.   Colon cancer Cousin 54   Breast cancer Neg Hx     Prior to Admission medications   Medication Sig Start Date End Date Taking? Authorizing Provider  acetaminophen (TYLENOL) 325 MG tablet Take 2 tablets (650 mg total) by mouth every 6 (six) hours as needed for mild pain (or Fever >/= 101). 03/15/21   Angiulli, Lavon Paganini, PA-C  albuterol (VENTOLIN HFA) 108 (90 Base) MCG/ACT inhaler Inhale 2 puffs into the lungs every 6 (six) hours as needed for wheezing or shortness of breath. 04/12/21   Angiulli, Lavon Paganini, PA-C  apixaban (ELIQUIS) 5 MG TABS tablet Take  1 tablet (5 mg total) by mouth 2 (two) times daily. 04/12/21   Angiulli, Lavon Paganini, PA-C  atorvastatin (LIPITOR) 20 MG tablet Take 1 tablet (20 mg total) by mouth at bedtime. 04/12/21   Angiulli, Lavon Paganini, PA-C  calcitRIOL (ROCALTROL) 0.25 MCG capsule Take 1 capsule (0.25 mcg total) by mouth daily. 04/12/21   Angiulli, Lavon Paganini, PA-C  calcium carbonate (OS-CAL - DOSED IN MG OF ELEMENTAL CALCIUM) 1250 (500 Ca) MG tablet Take 1 tablet (1,250 mg total) by mouth daily with breakfast. 04/12/21   Angiulli, Lavon Paganini, PA-C  diclofenac Sodium (VOLTAREN) 1 % GEL Apply 2 g topically 4 (four) times daily. 04/12/21   Angiulli, Lavon Paganini, PA-C  ergocalciferol (VITAMIN D2) 1.25 MG (50000 UT) capsule Take 1 capsule (50,000 Units total) by mouth once a week. 04/12/21   Angiulli, Lavon Paganini, PA-C  famotidine (PEPCID) 10 MG tablet Take 1 tablet (10  mg total) by mouth daily. 04/12/21   Angiulli, Lavon Paganini, PA-C  ferric gluconate 250 mg in sodium chloride 0.9 % 250 mL Inject 250 mg into the vein Every Tuesday,Thursday,and Saturday with dialysis. 04/12/21   Angiulli, Lavon Paganini, PA-C  folic acid (FOLVITE) 1 MG tablet Take 1 tablet (1 mg total) by mouth daily. 04/12/21   Angiulli, Lavon Paganini, PA-C  gabapentin (NEURONTIN) 300 MG capsule Take 1 capsule (300 mg total) by mouth at bedtime. 04/12/21   Angiulli, Lavon Paganini, PA-C  levothyroxine (SYNTHROID) 112 MCG tablet Take 1 tablet (112 mcg total) by mouth daily before breakfast. 04/12/21   Angiulli, Lavon Paganini, PA-C  melatonin 5 MG TABS Take 2 tablets (10 mg total) by mouth at bedtime as needed (sleep). 04/12/21   Angiulli, Lavon Paganini, PA-C  midodrine (PROAMATINE) 10 MG tablet Take 1 tablet (10 mg total) by mouth Every Tuesday,Thursday,and Saturday with dialysis. 04/12/21   Angiulli, Lavon Paganini, PA-C  Multiple Vitamins-Minerals (CERTAVITE/ANTIOXIDANTS) TABS Take 1 tablet by mouth at bedtime. 03/28/21   Fritzi Mandes, MD  polyethylene glycol powder (GLYCOLAX/MIRALAX) 17 GM/SCOOP powder Take 1 capful (17 g) with  water by mouth daily as needed for moderate constipation. 03/28/21   Fritzi Mandes, MD  sevelamer carbonate (RENVELA) 800 MG tablet Take 1 tablet (800 mg total) by mouth 3 (three) times daily with meals. 04/12/21   Angiulli, Lavon Paganini, PA-C  traMADol (ULTRAM) 50 MG tablet Take 1 tablet (50 mg total) by mouth every 12 (twelve) hours as needed for moderate pain. 04/12/21   Angiulli, Lavon Paganini, PA-C  fluticasone-salmeterol (ADVAIR HFA) 230-21 MCG/ACT inhaler Inhale 2 puffs into the lungs 2 (two) times daily as needed.  Patient not taking: No sig reported  02/09/20  [provider]    Physical Exam: Vitals:   04/24/21 1500 04/24/21 1615 04/24/21 1700 04/24/21 1701  BP: (!) 128/40 (!) 152/94 (!) 165/98   Pulse: 99 97 94   Resp: (!) 21 15 (!) 25 20  Temp:      SpO2: 96% 97% 99%   Weight:      Height:        General: Vital signs reviewed.  Patient is morbidly obese, in no acute distress and cooperative with exam.  Head: Normocephalic and atraumatic. Eyes: EOMI, conjunctivae normal, no scleral icterus.  Neck: Supple, trachea midline, normal ROM, .  Cardiovascular: Irregularly irregular Pulmonary/Chest: Clear to auscultation bilaterally, no wheezes, rales, or rhonchi. Abdominal: Soft, non-tender, non-distended, BS +,  Extremities: Trace lower extremity edema bilaterally,  pulses symmetric and intact bilaterally. Neurological: A&O x3, Strength is normal and symmetric bilaterally, cranial nerve II-XII are grossly intact, no focal motor deficit, sensory intact to light touch bilaterally.  Psychiatric: Normal mood and affect. speech and behavior is normal.   Data Reviewed: I personally reviewed prior data, labs and images.  Assessment and Plan: * Elevated troponin No chest pain or suspicious EKG changes. Most likely secondary to demand ischemia with concern of respiratory difficulty and unable to use of Trelegy last night. Recent echocardiogram done in December 2022 was with normal EF,  indeterminate diastolic function.  Patient is also on dialysis. -Trend troponin  ESRD on hemodialysis ([place]) No missed dialysis, last dialysis was yesterday. Goes for dialysis on Tuesday, Thursday and Saturday. -Consult nephrology for dialysis if staying until Tuesday  Hypothyroidism - Continue home Synthroid  Diabetes mellitus type 2 in obese (HCC) Last A1c of 7.2.  Not on any medication at home. -Continue to monitor -Renal SSI  Dyslipidemia -  Continue Lipitor  Atrial fibrillation, chronic (HCC) EKG with A-fib, initially some RVR which has been improved after taking a small dose of metoprolol in ED. -Continue with Eliquis  Morbid obesity with BMI of 60.0-69.9, adult (HCC) Estimated body mass index is 59.79 kg/m as calculated from the following:   Height as of this encounter: '5\' 4"'$  (1.626 m).   Weight as of this encounter: 158 kg.   -This will complicate overall prognosis  Obstructive sleep apnea - BiPAP at night  Essential hypertension Blood pressure mildly elevated. Patient was discharged on metoprolol and amlodipine during prior hospitalization, but stating that she was not taking antihypertensives due to softer blood pressure during dialysis. -Continue metoprolol -Holding amlodipine-will restart if needed -Continue to monitor -Continue midodrine on Tuesday, Thursday and Saturday  Chronic respiratory failure.  No concern of worsening hypercapnia as mental status at baseline. Venous blood gas with CO2 of 64, improved than prior. -Continue BiPAP at night while in the hospital.  History of nephrolithiasis.  No acute concern, s/p ureteral stent placement by urology. -Outpatient urology follow-up   Advance Care Planning:   Code Status: Full Code   Consults:   Family Communication: Cousin at bedside  Severity of Illness: The appropriate patient status for this patient is OBSERVATION. Observation status is judged to be reasonable and necessary in order to provide  the required intensity of service to ensure the patient's safety. The patient's presenting symptoms, physical exam findings, and initial radiographic and laboratory data in the context of their medical condition is felt to place them at decreased risk for further clinical deterioration. Furthermore, it is anticipated that the patient will be medically stable for discharge from the hospital within 2 midnights of admission.   Author: Lorella Nimrod, MD 04/24/2021 6:43 PM  For on call review [email or web address].

## 2021-04-24 NOTE — ED Notes (Signed)
Patient resting comfortably on stretcher. RR even and unlabored. Patient provided with medication per orders. Patient is currently on 2lpm oxygen via [place] but this nurse assisted with setting up her CPAP per her request. Patient has no complaints at this time. Patient also provided with beverage. Patient's mother is at bedside. ?

## 2021-04-24 NOTE — Assessment & Plan Note (Signed)
No chest pain or suspicious EKG changes. ?Most likely secondary to demand ischemia with concern of respiratory difficulty and unable to use of Trelegy last night. ?Recent echocardiogram done in December 2022 was with normal EF, indeterminate diastolic function.  Patient is also on dialysis. ?-Trend troponin ?

## 2021-04-24 NOTE — ED Notes (Signed)
Patient resting comfortably on stretcher in room. RR even and unlabored. Patient provided with remote and phone per her request. Patient has no other needs or complaints at this time. ?

## 2021-04-24 NOTE — Assessment & Plan Note (Signed)
Estimated body mass index is 59.79 kg/m? as calculated from the following: ?  Height as of this encounter: '5\' 4"'$  (1.626 m). ?  Weight as of this encounter: 158 kg.  ? ?-This will complicate overall prognosis ?

## 2021-04-24 NOTE — Assessment & Plan Note (Signed)
Last A1c of 7.2.  Not on any medication at home. ?-Continue to monitor ?-Renal SSI ?

## 2021-04-24 NOTE — Assessment & Plan Note (Signed)
No missed dialysis, last dialysis was yesterday. ?Goes for dialysis on Tuesday, Thursday and Saturday. ?-Consult nephrology for dialysis if staying until Tuesday ?

## 2021-04-25 ENCOUNTER — Other Ambulatory Visit (HOSPITAL_COMMUNITY): Payer: Self-pay

## 2021-04-25 ENCOUNTER — Other Ambulatory Visit: Payer: Self-pay

## 2021-04-25 DIAGNOSIS — N2581 Secondary hyperparathyroidism of renal origin: Secondary | ICD-10-CM | POA: Diagnosis not present

## 2021-04-25 DIAGNOSIS — D631 Anemia in chronic kidney disease: Secondary | ICD-10-CM | POA: Diagnosis not present

## 2021-04-25 DIAGNOSIS — R778 Other specified abnormalities of plasma proteins: Secondary | ICD-10-CM | POA: Diagnosis not present

## 2021-04-25 DIAGNOSIS — J9600 Acute respiratory failure, unspecified whether with hypoxia or hypercapnia: Secondary | ICD-10-CM | POA: Diagnosis not present

## 2021-04-25 LAB — CBC
HCT: 34 % — ABNORMAL LOW (ref 36.0–46.0)
Hemoglobin: 9.9 g/dL — ABNORMAL LOW (ref 12.0–15.0)
MCH: 28.1 pg (ref 26.0–34.0)
MCHC: 29.1 g/dL — ABNORMAL LOW (ref 30.0–36.0)
MCV: 96.6 fL (ref 80.0–100.0)
Platelets: 211 10*3/uL (ref 150–400)
RBC: 3.52 MIL/uL — ABNORMAL LOW (ref 3.87–5.11)
RDW: 19.1 % — ABNORMAL HIGH (ref 11.5–15.5)
WBC: 10.4 10*3/uL (ref 4.0–10.5)
nRBC: 0.2 % (ref 0.0–0.2)

## 2021-04-25 LAB — BASIC METABOLIC PANEL
Anion gap: 8 (ref 5–15)
BUN: 49 mg/dL — ABNORMAL HIGH (ref 6–20)
CO2: 31 mmol/L (ref 22–32)
Calcium: 9 mg/dL (ref 8.9–10.3)
Chloride: 99 mmol/L (ref 98–111)
Creatinine, Ser: 3.55 mg/dL — ABNORMAL HIGH (ref 0.44–1.00)
GFR, Estimated: 14 mL/min — ABNORMAL LOW (ref >60)
Glucose, Bld: 107 mg/dL — ABNORMAL HIGH (ref 70–99)
Potassium: 3.6 mmol/L (ref 3.5–5.1)
Sodium: 138 mmol/L (ref 135–145)

## 2021-04-25 LAB — TROPONIN I (HIGH SENSITIVITY)
Troponin I (High Sensitivity): 161 ng/L (ref <18)
Troponin I (High Sensitivity): 134 ng/L (ref <18)
Troponin I (High Sensitivity): 95 ng/L — ABNORMAL HIGH (ref <18)

## 2021-04-25 MED ORDER — METOPROLOL SUCCINATE ER 25 MG PO TB24
25.0000 mg | ORAL_TABLET | Freq: Every day | ORAL | 0 refills | Status: DC
  Filled 2021-04-25: qty 30, 30d supply, fill #0

## 2021-04-25 MED ORDER — AMLODIPINE BESYLATE 5 MG PO TABS
5.0000 mg | ORAL_TABLET | Freq: Every day | ORAL | 0 refills | Status: DC
  Filled 2021-04-25: qty 30, 30d supply, fill #0

## 2021-04-25 MED ORDER — AMLODIPINE BESYLATE 5 MG PO TABS
5.0000 mg | ORAL_TABLET | Freq: Every day | ORAL | Status: DC
  Administered 2021-04-25: 5 mg via ORAL
  Filled 2021-04-25: qty 1

## 2021-04-25 NOTE — Progress Notes (Signed)
[facility]  ROUNDING NOTE   Subjective:   Nicole Schmidt is a 56 year old female with past medical histories including hypertension, diabetes, hypothyroidism, atrial fibrillation, morbid obesity, and end-stage renal disease on hemodialysis.  Patient presents to the emergency department via EMS after being found by a friend with mild confusion.  Patient was admitted overnight for observation for Elevated troponin [R77.8]  Patient is known to our clinic and receives outpatient dialysis treatments at [facility] on a TTS schedule, overseen by Dr. Holley Raring.  Patient did receive her last dialysis treatment on Saturday, tolerated well.  Patient states she remembers putting on her BiPAP prior to sleeping in recliner.  Reports she possibly took it all to use the restroom, unsure of reasoning.  States she must have not replaced it.  She was found by her best friend after numerous unanswered calls.  Currently denies shortness of breath, nausea, vomiting, diarrhea.  Also denies abdominal pain pain or chest pain.  We have been consulted to evaluate dialysis needs.   Objective:  Vital signs in last 24 hours:  Temp:  [98 F (36.7 C)] 98 F (36.7 C) (03/12 1426) Pulse Rate:  [69-174] 89 (03/13 1200) Resp:  [13-33] 25 (03/13 1200) BP: (115-171)/(40-113) 160/100 (03/13 1200) SpO2:  [91 %-100 %] 100 % (03/13 1200) Weight:  [158 kg] 158 kg (03/12 1429)  Weight change:  Filed Weights   04/24/21 1429  Weight: (!) 158 kg    Intake/Output: No intake/output data recorded.   Intake/Output this shift:  Total I/O In: -  Out: 250 [Urine:250]  Physical Exam: General: NAD, resting comfortably  Head: Normocephalic, atraumatic. Moist oral mucosal membranes  Eyes: Anicteric  Lungs:  Clear to auscultation, normal effort,  O2  Heart: Regular rate and rhythm  Abdomen:  Soft, nontender, obese  Extremities: Trace peripheral edema.  Neurologic: Alert, oriented, moving all four extremities   Skin: No lesions  Access: Right IJ PermCath    Basic Metabolic Panel: Recent Labs  Lab 04/24/21 1447 04/25/21 0517  NA 137 138  K 3.1* 3.6  CL 96* 99  CO2 28 31  GLUCOSE 194* 107*  BUN 39* 49*  CREATININE 3.13* 3.55*  CALCIUM 9.0 9.0    Liver Function Tests: Recent Labs  Lab 04/24/21 1447  AST 28  ALT 25  ALKPHOS 80  BILITOT 0.3  PROT 6.8  ALBUMIN 3.1*   No results for input(s): LIPASE, AMYLASE in the last 168 hours. No results for input(s): AMMONIA in the last 168 hours.  CBC: Recent Labs  Lab 04/24/21 1447 04/25/21 0517  WBC 18.9* 10.4  HGB 11.0* 9.9*  HCT 37.9 34.0*  MCV 96.7 96.6  PLT 248 211    Cardiac Enzymes: No results for input(s): CKTOTAL, CKMB, CKMBINDEX, TROPONINI in the last 168 hours.  BNP: Invalid input(s): POCBNP  CBG: No results for input(s): GLUCAP in the last 168 hours.  Microbiology: Results for orders placed or performed during the hospital encounter of 04/24/21  Resp Panel by RT-PCR (Flu A&B, Covid) Nasopharyngeal Swab     Status: None   Collection Time: 04/24/21  7:13 PM   Specimen: Nasopharyngeal Swab; Nasopharyngeal(NP) swabs in vial transport medium  Result Value Ref Range Status   SARS Coronavirus 2 by RT PCR NEGATIVE NEGATIVE Final    Comment: (NOTE) SARS-CoV-2 target nucleic acids are NOT DETECTED.  The SARS-CoV-2 RNA is generally detectable in upper respiratory specimens during the acute phase of infection. The lowest concentration of SARS-CoV-2 viral copies this  assay can detect is 138 copies/mL. A negative result does not preclude SARS-Cov-2 infection and should not be used as the sole basis for treatment or other patient management decisions. A negative result may occur with  improper specimen collection/handling, submission of specimen other than nasopharyngeal swab, presence of viral mutation(s) within the areas targeted by this assay, and inadequate number of viral copies(<138 copies/mL). A negative result  must be combined with clinical observations, patient history, and epidemiological information. The expected result is Negative.  Fact Sheet for Patients:  EntrepreneurPulse.com.au  Fact Sheet for Healthcare Providers:  IncredibleEmployment.be  This test is no t yet approved or cleared by the [place] FDA and  has been authorized for detection and/or diagnosis of SARS-CoV-2 by FDA under an Emergency Use Authorization (EUA). This EUA will remain  in effect (meaning this test can be used) for the duration of the COVID-19 declaration under Section 564(b)(1) of the Act, 21 U.S.C.section 360bbb-3(b)(1), unless the authorization is terminated  or revoked sooner.       Influenza A by PCR NEGATIVE NEGATIVE Final   Influenza B by PCR NEGATIVE NEGATIVE Final    Comment: (NOTE) The Xpert Xpress SARS-CoV-2/FLU/RSV plus assay is intended as an aid in the diagnosis of influenza from Nasopharyngeal swab specimens and should not be used as a sole basis for treatment. Nasal washings and aspirates are unacceptable for Xpert Xpress SARS-CoV-2/FLU/RSV testing.  Fact Sheet for Patients: EntrepreneurPulse.com.au  Fact Sheet for Healthcare Providers: IncredibleEmployment.be  This test is not yet approved or cleared by the [place] FDA and has been authorized for detection and/or diagnosis of SARS-CoV-2 by FDA under an Emergency Use Authorization (EUA). This EUA will remain in effect (meaning this test can be used) for the duration of the COVID-19 declaration under Section 564(b)(1) of the Act, 21 U.S.C. section 360bbb-3(b)(1), unless the authorization is terminated or revoked.  Performed at [facility], [address] [place], [place] [address]     Coagulation Studies: No results for input(s): LABPROT, INR in the last 72 hours.  Urinalysis: No results for input(s): COLORURINE, LABSPEC, PHURINE,  GLUCOSEU, HGBUR, BILIRUBINUR, KETONESUR, PROTEINUR, UROBILINOGEN, NITRITE, LEUKOCYTESUR in the last 72 hours.  Invalid input(s): APPERANCEUR    Imaging: CT HEAD WO CONTRAST (5MM)  Result Date: 04/24/2021 CLINICAL DATA:  Headache, sudden, severe. EXAM: CT HEAD WITHOUT CONTRAST TECHNIQUE: Contiguous axial images were obtained from the base of the skull through the vertex without intravenous contrast. RADIATION DOSE REDUCTION: This exam was performed according to the departmental dose-optimization program which includes automated exposure control, adjustment of the mA and/or kV according to patient size and/or use of iterative reconstruction technique. COMPARISON:  Head CT 03/20/2021 FINDINGS: Brain: There is no evidence of an acute infarct, intracranial hemorrhage, mass, midline shift, or extra-axial fluid collection. The ventricles and sulci are normal. Vascular: Calcified atherosclerosis at the skull base. No hyperdense vessel. Skull: No acute fracture or suspicious osseous lesion. Sinuses/Orbits: Mild bilateral ethmoid sinus mucosal thickening. Small bilateral mastoid effusions. Unremarkable orbits. Other: None. IMPRESSION: No evidence of acute intracranial abnormality. Electronically Signed   By: Logan Bores M.D.   On: 04/24/2021 16:10   DG Chest Port 1 View  Result Date: 04/24/2021 CLINICAL DATA:  Shortness of breath. Found unconscious. History of CHF. EXAM: PORTABLE CHEST 1 VIEW COMPARISON:  04/03/2021 FINDINGS: Dialysis catheter tips at low SVC and high right atrium. Midline trachea. Cardiomegaly accentuated by AP portable technique. Atherosclerosis in the transverse aorta. No pleural effusion or pneumothorax. Mildly degraded  exam due to AP portable technique and patient body habitus. Mild pulmonary interstitial prominence. IMPRESSION: Cardiomegaly with mild pulmonary interstitial prominence. This could be the sequelae of chronic congestive heart failure or represent mild pulmonary venous  congestion. No overt acute congestive failure identified. Aortic Atherosclerosis (ICD10-I70.0). Electronically Signed   By: Abigail Miyamoto M.D.   On: 04/24/2021 15:06     Medications:     amLODipine  5 mg Oral Daily   apixaban  5 mg Oral BID   atorvastatin  20 mg Oral QHS   calcitRIOL  0.25 mcg Oral Daily   calcium carbonate  1,250 mg Oral Q breakfast   famotidine  10 mg Oral Daily   folic acid  9,983 mcg Oral Daily   gabapentin  300 mg Oral QHS   levothyroxine  112 mcg Oral Q0600   metoprolol succinate  25 mg Oral Daily   multivitamin with minerals  1 tablet Oral QHS   sevelamer carbonate  800 mg Oral TID WC   acetaminophen **OR** acetaminophen, albuterol, melatonin, polyethylene glycol, traMADol  Assessment/ Plan:  Ms. GHISLAINE HARCUM is a 56 y.o.  female with past medical histories including hypertension, diabetes, hypothyroidism, atrial fibrillation, morbid obesity, and end-stage renal disease on hemodialysis.  Patient presents to the emergency department via EMS after being found by a friend with mild confusion.  Patient was admitted overnight for observation for Elevated troponin [R77.8]   Respiratory distress due to obstructive sleep apnea requiring BiPAP.  Found with mild confusion at home prior to presentation to ED.  BiPAP placed during ED visit.  Patient now alert and oriented.  Continue to encourage BiPAP use when sleeping at home.  2.  End-stage renal disease on hemodialysis.  Last dialysis treatment received at outpatient clinic on Saturday.  If patient remains inpatient, we will perform dialysis tomorrow.  3. Anemia of chronic kidney disease Normocytic Lab Results  Component Value Date   HGB 9.9 (L) 04/25/2021    Hemoglobin remains within desired target.  4. Secondary Hyperparathyroidism:  Lab Results  Component Value Date   PTH 94 (H) 11/18/2017   CALCIUM 9.0 04/25/2021   PHOS 5.1 (H) 04/14/2021  Calcium within desired goal.   LOS: 0 Kaitelyn Jamison 3/13/20231:40 PM

## 2021-04-25 NOTE — ED Notes (Signed)
Patient resting comfortably on stretcher. RR even and unlabored. Patient requesting assistance with getting her CPAP on which was provided. Patient verbalized no other needs or complaints. ?

## 2021-04-25 NOTE — ED Notes (Signed)
Patient is resting comfortably on stretcher and sleeping well with CPAP on. RR even and unlabored. Patient in no obvious pain or distress at this time. Patient verbalizes no needs or complaints.  ? ?Provider has been notified of the patient increasing Troponin levels. Awaiting further. ?

## 2021-04-25 NOTE — TOC Initial Note (Signed)
Transition of Care (TOC) - Initial/Assessment Note  ? ? ?Patient Details  ?Name: Nicole Schmidt ?MRN: [id number] ?Date of Birth: 04/13/1965 ? ?Transition of Care (TOC) CM/SW Contact:    ?Shelbie Hutching, RN ?Phone Number: ?04/25/2021, 1:47 PM ? ?Clinical Narrative:                 ?Patient came into the hospital for hypoxia and placed under observation.  Patient will discharge home today, she reports she feels ready to return home.  Patient recently discharged from [facility].  She has not heard from the home health agency.  RNCM reached out to [facility] from [facility], she will check into why patient has not been seen yet.   ?Patient has all needed equipment at home, oxygen, walker, Trilogy machine.  She goes to dialysis [facility].  During the week she uses [facility] for transport and on Saturday a friend has been taking her. ?Patient is working on getting a ride now to take her home, she has a friend bringing her clothes and her oxygen from home but she has to get another person to drive her home as she is not able to get in the vehicle of the other person.   ? ?MD has entered [place] orders. ? ? ?Expected Discharge Plan: [place] ?Barriers to Discharge: Barriers Resolved ? ? ?Patient Goals and CMS Choice ?Patient states their goals for this hospitalization and ongoing recovery are:: Patient glad to be going home ?CMS Medicare.gov Compare Post Acute Care list provided to:: Patient ?  ? ?Expected Discharge Plan and Services ?Expected Discharge Plan: [place] ?  ?Discharge Planning Services: CM Consult ?Post Acute Care Choice: Home Health ?Living arrangements for the past 2 months: [place] ?Expected Discharge Date: 04/25/21               ?DME Arranged: N/A ?DME Agency: NA ?  ?  ?  ?HH Arranged: Therapist, sports, PT, Nurse's Aide ?[place] Agency: Other - See comment ([place]) ?Date HH Agency Contacted: 04/25/21 ?Time [place]: 3149 ?Representative spoke with at [place]:  Apolonio Schneiders ? ?Prior Living Arrangements/Services ?Living arrangements for the past 2 months: [place] ?Lives with:: Self ?Patient language and need for interpreter reviewed:: Yes ?Do you feel safe going back to the place where you live?: Yes      ?Need for Family Participation in Patient Care: Yes (Comment) ?Care giver support system in place?: Yes (comment) ?Current home services: DME (walker, cane, oxygen, Trilogy) ?Criminal Activity/Legal Involvement Pertinent to Current Situation/Hospitalization: No - Comment as needed ? ?Activities of Daily Living ?Home Assistive Devices/Equipment: CPAP, Eyeglasses, Walker (specify type), Oxygen ?ADL Screening (condition at time of admission) ?Patient's cognitive ability adequate to safely complete daily activities?: Yes ?Is the patient deaf or have difficulty hearing?: No ?Does the patient have difficulty seeing, even when wearing glasses/contacts?: No ?Does the patient have difficulty concentrating, remembering, or making decisions?: No ?Patient able to express need for assistance with ADLs?: Yes ?Does the patient have difficulty dressing or bathing?: No ?Independently performs ADLs?: No ?Communication: Independent ?Dressing (OT): Needs assistance ?Is this a change from baseline?: Change from baseline, expected to last <3days ?Grooming: Independent ?Feeding: Independent ?Bathing: Needs assistance ?Is this a change from baseline?: Change from baseline, expected to last <3 days ?Toileting: Needs assistance ?Is this a change from baseline?: Change from baseline, expected to last <3 days ?In/Out Bed: Needs assistance ?Is this a change  from baseline?: Change from baseline, expected to last <3 days ?Walks in Home: Independent with device (comment) ?Does the patient have difficulty walking or climbing stairs?: Yes ?Weakness of Legs: Both ?Weakness of Arms/Hands: None ? ?Permission Sought/Granted ?Permission sought to share information with : Case Freight forwarder, Forensic psychologist ?Permission granted to share information with : Yes, Verbal Permission Granted ?   ? Permission granted to share info w AGENCY: [place] ?   ?   ? ?Emotional Assessment ?Appearance:: Appears stated age ?Attitude/Demeanor/Rapport: Engaged ?Affect (typically observed): Accepting ?Orientation: : Oriented to Self, Oriented to Place, Oriented to  Time, Oriented to Situation ?Alcohol / Substance Use: Not Applicable ?Psych Involvement: No (comment) ? ?Admission diagnosis:  Elevated troponin [R77.8] ?Patient Active Problem List  ? Diagnosis Date Noted  ? OSA (obstructive sleep apnea)   ? Hypoxia   ? Acute blood loss anemia   ? Super obese   ? Controlled type 2 diabetes mellitus with hyperglycemia, without long-term current use of insulin ([place])   ? ESRD on hemodialysis ([place])   ? Acute respiratory failure with hypoxia and hypercapnia (HCC)   ? Ambulatory dysfunction   ? Pressure injury of skin 03/21/2021  ? Acute respiratory failure with hypoxia ([place]) 03/20/2021  ? Debility 03/04/2021  ? ESRD (end stage renal disease) ([place]) 03/02/2021  ? Acute on chronic renal failure ([place]) 02/24/2021  ? Viral URI   ? Multifocal pneumonia 02/19/2021  ? Hydronephrosis with renal and ureteral calculus obstruction 02/19/2021  ? Hyperkalemia 02/19/2021  ? Cardiorenal syndrome, stage 1-4 or unspecified chronic kidney disease, with heart failure ([place]) 02/09/2021  ?  Class: Hospitalized for  ? AKI (acute kidney injury) ([place]) 02/09/2021  ? Bronchitis 02/09/2021  ? Cardiorenal syndrome with renal failure 02/09/2021  ? Chronic respiratory failure with hypoxia ([place]) 01/13/2021  ? Physical deconditioning 03/16/2020  ? Abnormal findings on diagnostic imaging of lung 03/16/2020  ? AF (paroxysmal atrial fibrillation) ([place])   ? Acquired thrombophilia ([place])   ? Acute on chronic respiratory failure with hypoxia and hypercapnia ([place]) 02/21/2020  ? Acute on chronic respiratory failure with hypoxemia ([place]) 02/21/2020  ? Atrial fibrillation,  chronic ([place]) 02/09/2020  ? Dyslipidemia 02/09/2020  ? Diabetes mellitus type 2 in obese ([place]) 02/09/2020  ? Hypothyroidism 02/09/2020  ? Thrush, oral 02/09/2020  ? Morbid obesity with BMI of 60.0-69.9, adult ([place]) 02/03/2020  ? Chronic anticoagulation 12/30/2019  ? Obstructive sleep apnea 08/04/2019  ? Atrial fibrillation with RVR ([place]) 10/18/2018  ? Acute hypoxemic respiratory failure ([place]) 03/29/2018  ? Acute on chronic diastolic CHF (congestive heart failure) ([place]) 02/04/2018  ? Lymphedema 02/04/2018  ? Essential hypertension 12/09/2017  ? Long term (current) use of aromatase inhibitors 11/17/2017  ? Elevated alkaline phosphatase level 05/20/2017  ? Goals of care, counseling/discussion 05/14/2017  ? Chronic renal disease, stage III ([place]) 07/08/2015  ? Iron deficiency anemia 07/08/2015  ? Acute renal failure superimposed on stage 4 chronic kidney disease ([place]) 10/22/2014  ? History of breast cancer 10/22/2014  ? Elevated troponin 10/22/2014  ? Pulmonary emboli ([place]) 10/18/2014  ? History of pulmonary embolus (PE) 10/18/2014  ? Candidiasis of breast 07/06/2014  ? Malignant neoplasm of right female breast ([place]) 07/24/2012  ? ?PCP:  Lenard Simmer, MD ?Pharmacy:   ?[facility] - [address] [place] ?[address] ?[place] [address] ?Phone: [phone] Fax: [phone] ? ?[facility] ?[address] ?[place]  [address] ?Phone: [phone] Fax: [phone] ? ? ? ? ?Social Determinants of Health (SDOH) Interventions ?  ? ?Readmission Risk Interventions ?Readmission Risk Prevention Plan 02/11/2020 10/19/2018  ?Transportation Screening Complete Complete  ?PCP or Specialist Appt within 3-5 Days Not Complete -  ?Not Complete comments To be scheduled when discharged. -  ?[place] or Home Care Consult Complete -  ?Social Work Consult for [place] Planning/Counseling Complete Patient refused  ?Palliative Care Screening Complete Not  Applicable  ?Medication Review Press photographer) Complete Complete  ?Some recent data might be hidden  ? ? ? ?

## 2021-04-25 NOTE — ED Notes (Signed)
Patient resting comfortably on stretcher in room with CPAP on. RR even and unlabored. Patient woke up momentarily and asked for assistance tightening up the head straps on the CPAP. Patient verbalized no other needs or new complaints.  ?

## 2021-04-25 NOTE — ED Notes (Signed)
Pt continuing to get a hold of family and friends to come and pick her up when d/c happens. ?

## 2021-04-25 NOTE — ED Notes (Addendum)
Patient is resting comfortably on stretcher and sleeping well with CPAP on. RR even and unlabored. Patient in no obvious pain or distress at this time. Patient verbalizes no needs or complaints.  ? ?

## 2021-04-25 NOTE — ED Notes (Signed)
Dr. Bridgett Larsson was seeing other patients in the department so I made him aware of the patient's Troponin trend. No new orders provided. ?

## 2021-04-25 NOTE — ED Notes (Addendum)
Patient is resting comfortably on stretcher and sleeping well with CPAP on. RR even and unlabored. Patient in no obvious pain or distress at this time. Patient verbalizes no needs or complaints.  ? ?Repeat Troponin obtained by Jinny Blossom, RN and sent to lab. This was to be collected at [facility] however this nurse missed the order because it was NOT in the task list. It was found in the orders by the charge nurse, Jinny Blossom. ?

## 2021-04-25 NOTE — Discharge Summary (Signed)
Physician Discharge Summary   Patient: Nicole Schmidt MRN: [id number] DOB: [birth year]  Admit date:     04/24/2021  Discharge date: 04/25/21  Discharge Physician: Lorella Nimrod   PCP: Lenard Simmer, MD   Recommendations at discharge:  Follow-up with your primary care provider within a week Make sure patient continue with dialysis according to her schedule  Discharge Diagnoses: Principal Problem:   Elevated troponin Active Problems:   Essential hypertension   Obstructive sleep apnea   Morbid obesity with BMI of 60.0-69.9, adult (HCC)   Atrial fibrillation, chronic (HCC)   Dyslipidemia   Diabetes mellitus type 2 in obese ([place])   Hypothyroidism   ESRD on hemodialysis ([place])  Resolved Problems:   * No resolved hospital problems. *  Hospital Course: : Nicole Schmidt is a 56 y.o. 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 Apparently patient did place Trelegy on, when she woke up it was off, she was not sure when she took the mask off, cousin got concerned when she was not picking up the phone so she came to check on her and found her confused, there was some concern of CO2 retention so she was brought to ED for further evaluation.   Patient denies any shortness of breath, chest pain, fever, chills, nausea, vomiting, diarrhea, abdominal pain or urinary symptoms.  She saw her PCP last week for concern of some cough and congestion and was given a course of antibiotics and prednisone. Per patient she is compliant with all of her medications.   Patient was recently admitted in hospital with acute on chronic hypercapnic respiratory failure and during that time she was also diagnosed with nephrolithiasis s/p ureteral stent and an ESBL  UTI which was treated with IV meropenem.  Patient was supposed to follow-up with urology as an outpatient, she has her upcoming appointment next week on Wednesday.  No urinary concerns at this time.  Patient was discharged to rehab and came home approximately 10 days ago.    She was hemodynamically stable, alert and oriented x3, saturating well on her baseline oxygen requirement of 2 to 3 L, venous blood gas with CO2 of 64, labs pertinent for leukocytosis at 18.9, hemoglobin of 11 and platelets of 248, all cell lines increased from her recent labs, may be some hemoconcentration, troponin at 79 which increased to 149 on subsequent check, EKG with A-fib which seems chronic and no acute ST changes, CT head was without any acute changes and chest x-ray with mild pulmonary interstitial prominence.  She was admitted at ED provider request due to elevated troponin despite reassurance.  No chest pain.  There was no need for heparin infusion.  Trending troponin shows peaking at 164 and then trending down, most likely secondary to demand ischemia after missing her Trelegy and having some respiratory distress.  She was also found to have elevated blood pressure, according to prior discharge summary she was supposed to continue with amlodipine and metoprolol, per patient she was told to stop using it. We restarted metoprolol and amlodipine and she can use it on her nondialysis days. She was on midodrine for dialysis which she can continue if needed.  She will continue the rest of her home medications and follow-up with her providers.  Assessment and Plan: * Elevated  troponin No chest pain or suspicious EKG changes. Most likely secondary to demand ischemia with concern of respiratory difficulty and unable to use of Trelegy last night. Recent echocardiogram done in December 2022 was with normal EF, indeterminate diastolic function.  Patient is also on dialysis. -Trend troponin  ESRD on hemodialysis ([place]) No  missed dialysis, last dialysis was yesterday. Goes for dialysis on Tuesday, Thursday and Saturday. -Consult nephrology for dialysis if staying until Tuesday  Hypothyroidism - Continue home Synthroid  Diabetes mellitus type 2 in obese (HCC) Last A1c of 7.2.  Not on any medication at home. -Continue to monitor -Renal SSI  Dyslipidemia - Continue Lipitor  Atrial fibrillation, chronic (HCC) EKG with A-fib, initially some RVR which has been improved after taking a small dose of metoprolol in ED. -Continue with Eliquis  Morbid obesity with BMI of 60.0-69.9, adult (HCC) Estimated body mass index is 59.79 kg/m as calculated from the following:   Height as of this encounter: '5\' 4"'$  (1.626 m).   Weight as of this encounter: 158 kg.   -This will complicate overall prognosis  Obstructive sleep apnea - BiPAP at night  Essential hypertension Blood pressure mildly elevated. Patient was discharged on metoprolol and amlodipine during prior hospitalization, but stating that she was not taking antihypertensives due to softer blood pressure during dialysis. -Continue metoprolol -Holding amlodipine-will restart if needed -Continue to monitor -Continue midodrine on Tuesday, Thursday and Saturday   Consultants: None Procedures performed: None Disposition: Home health Diet recommendation:  Discharge Diet Orders (From admission, onward)     Start     Ordered   04/25/21 0000  Diet - low sodium heart healthy        04/25/21 1051           Cardiac and Carb modified diet DISCHARGE MEDICATION: Allergies as of 04/25/2021   No Known Allergies      Medication List     STOP taking these medications    Klor-Con M20 20 MEQ tablet Generic drug: potassium chloride SA       TAKE these medications    acetaminophen 325 MG tablet Commonly known as: TYLENOL Take 2 tablets (650 mg total) by mouth every 6 (six) hours as needed for mild pain (or Fever >/= 101).   Advair Diskus 250-50  MCG/ACT Aepb Generic drug: fluticasone-salmeterol 2 (two) times daily.   albuterol 108 (90 Base) MCG/ACT inhaler Commonly known as: VENTOLIN HFA Inhale 2 puffs into the lungs every 6 (six) hours as needed for wheezing or shortness of breath.   amLODipine 5 MG tablet Commonly known as: NORVASC Take 1 tablet (5 mg total) by mouth daily.   apixaban 5 MG Tabs tablet Commonly known as: Eliquis Take 1 tablet (5 mg total) by mouth 2 (two) times daily.   atorvastatin 20 MG tablet Commonly known as: LIPITOR Take 1 tablet (20 mg total) by mouth at bedtime.   calcitRIOL 0.25 MCG capsule Commonly known as: ROCALTROL Take 1 capsule (0.25 mcg total) by mouth daily.   calcium carbonate 1250 (500 Ca) MG tablet Commonly known as: OS-CAL - dosed in mg of elemental calcium Take 1 tablet (1,250 mg total) by mouth daily with breakfast.   CertaVite/Antioxidants Tabs Take 1 tablet by mouth at bedtime.   diclofenac Sodium 1 % Gel Commonly known as: VOLTAREN Apply 2 g topically 4 (four) times daily.   doxycycline 100 MG tablet Commonly known as: VIBRA-TABS Take 100 mg by mouth 2 (two) times daily.   ferric gluconate 250  mg in sodium chloride 0.9 % 250 mL Inject 250 mg into the vein Every Tuesday,Thursday,and Saturday with dialysis.   folic acid 1 MG tablet Commonly known as: FOLVITE Take 1 tablet (1 mg total) by mouth daily.   gabapentin 300 MG capsule Commonly known as: NEURONTIN Take 1 capsule (300 mg total) by mouth at bedtime.   Heartburn Relief 10 MG tablet Generic drug: famotidine Take 1 tablet (10 mg total) by mouth daily.   levothyroxine 112 MCG tablet Commonly known as: SYNTHROID Take 1 tablet (112 mcg total) by mouth daily before breakfast.   melatonin 5 MG Tabs Take 2 tablets (10 mg total) by mouth at bedtime as needed (sleep).   metoprolol succinate 25 MG 24 hr tablet Commonly known as: TOPROL-XL Take 1 tablet (25 mg total) by mouth daily. Take with or immediately  following a meal. Start taking on: April 26, 2021   midodrine 10 MG tablet Commonly known as: PROAMATINE Take 1 tablet (10 mg total) by mouth Every Tuesday,Thursday,and Saturday with dialysis.   polyethylene glycol powder 17 GM/SCOOP powder Commonly known as: GLYCOLAX/MIRALAX Take 1 capful (17 g) with water by mouth daily as needed for moderate constipation.   predniSONE 10 MG (48) Tbpk tablet Commonly known as: STERAPRED UNI-PAK 48 TAB See admin instructions.   sevelamer carbonate 800 MG tablet Commonly known as: RENVELA Take 1 tablet (800 mg total) by mouth 3 (three) times daily with meals.   traMADol 50 MG tablet Commonly known as: ULTRAM Take 1 tablet (50 mg total) by mouth every 12 (twelve) hours as needed for moderate pain.   Vitamin D (Ergocalciferol) 1.25 MG (50000 UNIT) Caps capsule Commonly known as: DRISDOL Take 1 capsule (50,000 Units total) by mouth once a week.        Follow-up Information     Morayati, Lourdes Sledge, MD. Schedule an appointment as soon as possible for a visit in 1 week(s).   Specialty: Endocrinology Contact information: [place] [address] [phone]         Wellington Hampshire, MD .   Specialty: Cardiology Contact information: [place] [address] [phone]                Discharge Exam: Danley Danker Weights   04/24/21 1429  Weight: (!) 158 kg   General.  Morbidly obese lady, in no acute distress. Pulmonary.  Lungs clear bilaterally, normal respiratory effort. CV.  Regular rate and rhythm, no JVD, rub or murmur. Abdomen.  Soft, nontender, nondistended, BS positive. CNS.  Alert and oriented .  No focal neurologic deficit. Extremities.  No edema, no cyanosis, pulses intact and symmetrical. Psychiatry.  Judgment and insight appears normal.   Condition at discharge: stable  The results of significant diagnostics from this hospitalization (including imaging, microbiology,  ancillary and laboratory) are listed below for reference.   Imaging Studies: DG Ankle Complete Right  Result Date: 04/01/2021 CLINICAL DATA:  Right ankle pain EXAM: RIGHT ANKLE - COMPLETE 3+ VIEW COMPARISON:  11/20/2004 FINDINGS: Frontal, oblique, lateral views of the right ankle are obtained. Evidence of prior healed lateral malleolar fracture. No acute fracture, subluxation, or dislocation. 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 Prominent superior and inferior calcaneal spurs. Mild diffuse subcutaneous edema. IMPRESSION: 1. Mild osteoarthritis. 2. No acute displaced fracture. 3. Prominent calcaneal spurs. Electronically Signed   By: Randa Ngo M.D.   On: 04/01/2021 17:06   CT HEAD WO CONTRAST (5MM)  Result Date:  04/24/2021 CLINICAL DATA:  Headache, sudden, severe. EXAM: CT HEAD WITHOUT CONTRAST TECHNIQUE: Contiguous axial images were obtained from the base of the skull through the vertex without intravenous contrast. RADIATION DOSE REDUCTION: This exam was performed according to the departmental dose-optimization program which includes automated exposure control, adjustment of the mA and/or kV according to patient size and/or use of iterative reconstruction technique. COMPARISON:  Head CT 03/20/2021 FINDINGS: Brain: There is no evidence of an acute infarct, intracranial hemorrhage, mass, midline shift, or extra-axial fluid collection. The ventricles and sulci are normal. Vascular: Calcified atherosclerosis at the skull base. No hyperdense vessel. Skull: No acute fracture or suspicious osseous lesion. Sinuses/Orbits: Mild bilateral ethmoid sinus mucosal thickening. Small bilateral mastoid effusions. Unremarkable orbits. Other: None. IMPRESSION: No evidence of acute intracranial abnormality. Electronically Signed   By: Logan Bores M.D.   On: 04/24/2021 16:10   DG Chest Port 1 View  Result Date: 04/24/2021 CLINICAL DATA:  Shortness of breath. Found unconscious. History  of CHF. EXAM: PORTABLE CHEST 1 VIEW COMPARISON:  04/03/2021 FINDINGS: Dialysis catheter tips at low SVC and high right atrium. Midline trachea. Cardiomegaly accentuated by AP portable technique. Atherosclerosis in the transverse aorta. No pleural effusion or pneumothorax. Mildly degraded exam due to AP portable technique and patient body habitus. Mild pulmonary interstitial prominence. IMPRESSION: Cardiomegaly with mild pulmonary interstitial prominence. This could be the sequelae of chronic congestive heart failure or represent mild pulmonary venous congestion. No overt acute congestive failure identified. Aortic Atherosclerosis (ICD10-I70.0). Electronically Signed   By: Abigail Miyamoto M.D.   On: 04/24/2021 15:06   DG CHEST PORT 1 VIEW  Result Date: 04/03/2021 CLINICAL DATA:  Hypoxia.  History of diabetes, hypertension and CHF. EXAM: PORTABLE CHEST 1 VIEW COMPARISON:  03/26/2021 FINDINGS: There is a right chest wall dialysis catheter with tips in the distal SVC and cavoatrial junction. Stable cardiomediastinal contours. Aortic atherosclerosis. Diffuse pulmonary vascular congestion. Platelike atelectasis is identified in the right upper lobe with improvement in previous right upper lobe airspace disease. No new findings. IMPRESSION: 1. Improving aeration to the right upper lobe. 2. Pulmonary vascular congestion. Electronically Signed   By: Kerby Moors M.D.   On: 04/03/2021 08:00    Microbiology: Results for orders placed or performed during the hospital encounter of 04/24/21  Resp Panel by RT-PCR (Flu A&B, Covid) Nasopharyngeal Swab     Status: None   Collection Time: 04/24/21  7:13 PM   Specimen: Nasopharyngeal Swab; Nasopharyngeal(NP) swabs in vial transport medium  Result Value Ref Range Status   SARS Coronavirus 2 by RT PCR NEGATIVE NEGATIVE Final    Comment: (NOTE) SARS-CoV-2 target nucleic acids are NOT DETECTED.  The SARS-CoV-2 RNA is generally detectable in upper respiratory specimens  during the acute phase of infection. The lowest concentration of SARS-CoV-2 viral copies this assay can detect is 138 copies/mL. A negative result does not preclude SARS-Cov-2 infection and should not be used as the sole basis for treatment or other patient management decisions. A negative result may occur with  improper specimen collection/handling, submission of specimen other than nasopharyngeal swab, presence of viral mutation(s) within the areas targeted by this assay, and inadequate number of viral copies(<138 copies/mL). A negative result must be combined with clinical observations, patient history, and epidemiological information. The expected result is Negative.  Fact Sheet for Patients:  EntrepreneurPulse.com.au  Fact Sheet for Healthcare Providers:  IncredibleEmployment.be  This test is no t yet approved or cleared by the [place] and  has been authorized  for detection and/or diagnosis of SARS-CoV-2 by FDA under an Emergency Use Authorization (EUA). This EUA will remain  in effect (meaning this test can be used) for the duration of the COVID-19 declaration under Section 564(b)(1) of the Act, 21 U.S.C.section 360bbb-3(b)(1), unless the authorization is terminated  or revoked sooner.       Influenza A by PCR NEGATIVE NEGATIVE Final   Influenza B by PCR NEGATIVE NEGATIVE Final    Comment: (NOTE) The Xpert Xpress SARS-CoV-2/FLU/RSV plus assay is intended as an aid in the diagnosis of influenza from Nasopharyngeal swab specimens and should not be used as a sole basis for treatment. Nasal washings and aspirates are unacceptable for Xpert Xpress SARS-CoV-2/FLU/RSV testing.  Fact Sheet for Patients: EntrepreneurPulse.com.au  Fact Sheet for Healthcare Providers: IncredibleEmployment.be  This test is not yet approved or cleared by the [place] FDA and has been authorized for detection  and/or diagnosis of SARS-CoV-2 by FDA under an Emergency Use Authorization (EUA). This EUA will remain in effect (meaning this test can be used) for the duration of the COVID-19 declaration under Section 564(b)(1) of the Act, 21 U.S.C. section 360bbb-3(b)(1), unless the authorization is terminated or revoked.  Performed at [facility], [address] [place], [place] [address]     Labs: CBC: Recent Labs  Lab 04/24/21 1447 04/25/21 0517  WBC 18.9* 10.4  HGB 11.0* 9.9*  HCT 37.9 34.0*  MCV 96.7 96.6  PLT 248 062   Basic Metabolic Panel: Recent Labs  Lab 04/24/21 1447 04/25/21 0517  NA 137 138  K 3.1* 3.6  CL 96* 99  CO2 28 31  GLUCOSE 194* 107*  BUN 39* 49*  CREATININE 3.13* 3.55*  CALCIUM 9.0 9.0   Liver Function Tests: Recent Labs  Lab 04/24/21 1447  AST 28  ALT 25  ALKPHOS 80  BILITOT 0.3  PROT 6.8  ALBUMIN 3.1*   CBG: No results for input(s): GLUCAP in the last 168 hours.  Discharge time spent: greater than 30 minutes.  This record has been created using Systems analyst. Errors have been sought and corrected,but may not always be located. Such creation errors do not reflect on the standard of care.   Signed: Lorella Nimrod, MD [facility] 04/25/2021

## 2021-04-25 NOTE — Progress Notes (Signed)
Admission profile updated. ?

## 2021-04-25 NOTE — ED Notes (Signed)
Patient resting comfortably on stretcher in room. RR even and unlabored. Patient verbalizes no complaints of chest pain or shob. She is provided a beverage per her request.  ?

## 2021-04-25 NOTE — ED Notes (Signed)
RN advised provider that pt needs her prescriptions changed from [place] to [facility] on [address] here in [place] as she is not able to get to [facility]. Per MD, "I am unable to change the script at this time and she will try again tomorrow." ?

## 2021-04-25 NOTE — ED Notes (Signed)
Pt awake and CPAP taken off. Pt placed on 2L [place]  ?

## 2021-04-25 NOTE — Evaluation (Signed)
Physical Therapy Evaluation ?Patient Details ?Name: Nicole Schmidt ?MRN: [id number] ?DOB: 1965-09-30 ?Today's Date: 04/25/2021 ? ?History of Present Illness ? Pt is a 56 y.o. female presenting from home (family found pt in w/c asleep without her breathing machine and was confused upon waking); recent hospitalization for CHF and developed kidney problems--started on dialysis; pt noted to be hypoxic.  Recent DC home from inpatent rehab at [facility].  Pt admitted with elevated troponin (most likely secondary demand ischemia) and ESRD on HD.  PMH includes chronic hypoxemic respiratory failure on 2 L O2, DM, R breast CA, h/o hematuria, htn, HLD, morbid obesity, a-fib, PE on Eliquis, ESRD.  ?Clinical Impression ? Prior to ED visit, pt was modified independent ambulating with RW; lives alone in 1 level home with ramp to enter; sleeps in lift chair.  Currently pt is CGA with transfers and ambulation 80 feet with bariatric RW use.  Generalized weakness and decreased activity tolerance noted.  Pt would benefit from skilled PT to address noted impairments and functional limitations (see below for any additional details).  Upon hospital discharge, pt would benefit from HHPT and support from family/friends.   ? ?Recommendations for follow up therapy are one component of a multi-disciplinary discharge planning process, led by the attending physician.  Recommendations may be updated based on patient status, additional functional criteria and insurance authorization. ? ?Follow Up Recommendations Home health PT ? ?  ?Assistance Recommended at Discharge Set up Supervision/Assistance  ?Patient can return home with the following ? A little help with bathing/dressing/bathroom;Assistance with cooking/housework;Assist for transportation;Help with stairs or ramp for entrance ? ?  ?Equipment Recommendations Other (comment) (pt has walker at home already)  ?Recommendations for Other Services ?    ?  ?Functional Status Assessment Patient has had  a recent decline in their functional status and demonstrates the ability to make significant improvements in function in a reasonable and predictable amount of time.  ? ?  ?Precautions / Restrictions Precautions ?Precautions: Fall ?Precaution Comments: monitor O2 ?Restrictions ?Weight Bearing Restrictions: No  ? ?  ? ?Mobility ? Bed Mobility ?Overal bed mobility: Needs Assistance ?Bed Mobility: Supine to Sit, Sit to Supine ?  ?  ?Supine to sit: Mod assist (assist for trunk) ?Sit to supine: Mod assist (assist for B LE's) ?  ?General bed mobility comments: increased effort to get in/out of ED stretcher bed ?  ? ?Transfers ?Overall transfer level: Needs assistance ?Equipment used: Rolling walker (2 wheels) (bariatric) ?Transfers: Sit to/from Stand ?Sit to Stand: Min guard ?  ?  ?  ?  ?  ?General transfer comment: x2 trials standing from ED stretcher bed with walker use ?  ? ?Ambulation/Gait ?Ambulation/Gait assistance: Min guard (2nd assist for line management) ?Gait Distance (Feet): 80 Feet ?Assistive device: Rolling walker (2 wheels) (bariatric) ?  ?Gait velocity: mildly decreased ?  ?  ?General Gait Details: partial step through gait pattern; steady with walker use ? ?Stairs ?  ?  ?  ?  ?  ? ?Wheelchair Mobility ?  ? ?Modified Rankin (Stroke Patients Only) ?  ? ?  ? ?Balance Overall balance assessment: Needs assistance ?Sitting-balance support: No upper extremity supported, Feet unsupported ?Sitting balance-Leahy Scale: Normal ?Sitting balance - Comments: steady sitting reaching outside BOS ?  ?Standing balance support: Single extremity supported ?Standing balance-Leahy Scale: Fair ?Standing balance comment: steady standing with at least single UE support ?  ?  ?  ?  ?  ?  ?  ?  ?  ?  ?  ?   ? ? ? ?  Pertinent Vitals/Pain Pain Assessment ?Pain Assessment: No/denies pain ?Vitals (HR and O2 on 2 L via nasal cannula) stable and WFL throughout treatment session.  ? ? ?Home Living Family/patient expects to be discharged  to:: Private residence ?Living Arrangements: Alone ?Available Help at Discharge: Friend(s);Available PRN/intermittently ?Type of Home: House ?Home Access: Ramped entrance (temporary ramp) ?  ?  ?  ?Home Layout: One level ?Home Equipment: Conservation officer, nature (2 wheels);Wheelchair - manual;Shower seat ?   ?  ?Prior Function Prior Level of Function : Independent/Modified Independent ?  ?  ?  ?  ?  ?  ?Mobility Comments: Ambulatory with RW; 2 L home O2 use; sleeps in lift chair ?  ?  ? ? ?Hand Dominance  ?   ? ?  ?Extremity/Trunk Assessment  ? Upper Extremity Assessment ?Upper Extremity Assessment: Generalized weakness ?  ? ?Lower Extremity Assessment ?Lower Extremity Assessment: Generalized weakness ?  ? ?Cervical / Trunk Assessment ?Cervical / Trunk Assessment: Other exceptions ?Cervical / Trunk Exceptions: forward head/shoulders  ?Communication  ? Communication: No difficulties  ?Cognition Arousal/Alertness: Awake/alert ?Behavior During Therapy: [facility] for tasks assessed/performed ?Overall Cognitive Status: Within Functional Limits for tasks assessed ?  ?  ?  ?  ?  ?  ?  ?  ?  ?  ?  ?  ?  ?  ?  ?  ?  ?  ?  ? ?  ?General Comments  Nursing cleared pt for participation in physical therapy.  Pt agreeable to PT session.  Extra time required during session d/t pt incontinent of urine (requiring clean-up and bed linen change) and also extra time to get pt in/out of ED stretcher bed (pt normally sleeps in lift chair at home; also ED stretcher bed tall for pt so more difficult to get back in--eventual requiring 2 assist to boost pt up in bed and reposition pt to improve comfort). ? ?  ?Exercises  Bed mobility, transfers, and ambulation.  ? ?Assessment/Plan  ?  ?PT Assessment Patient needs continued PT services  ?PT Problem List Decreased strength;Decreased activity tolerance;Decreased balance;Decreased mobility;Cardiopulmonary status limiting activity ? ?   ?  ?PT Treatment Interventions DME instruction;Gait training;Functional  mobility training;Therapeutic activities;Therapeutic exercise;Balance training;Patient/family education   ? ?PT Goals (Current goals can be found in the Care Plan section)  ?Acute Rehab PT Goals ?Patient Stated Goal: to improve strength and mobility; transition away from walker (so it is easier to manage O2 tank) ?PT Goal Formulation: With patient ?Time For Goal Achievement: 05/09/21 ?Potential to Achieve Goals: Fair ? ?  ?Frequency Min 2X/week ?  ? ? ?Co-evaluation   ?  ?  ?  ?  ? ? ?  ?AM-PAC PT "6 Clicks" Mobility  ?Outcome Measure Help needed turning from your back to your side while in a flat bed without using bedrails?: A Little ?Help needed moving from lying on your back to sitting on the side of a flat bed without using bedrails?: A Lot ?Help needed moving to and from a bed to a chair (including a wheelchair)?: A Little ?Help needed standing up from a chair using your arms (e.g., wheelchair or bedside chair)?: A Little ?Help needed to walk in hospital room?: A Little ?Help needed climbing 3-5 steps with a railing? : A Lot ?6 Click Score: 16 ? ?  ?End of Session Equipment Utilized During Treatment: Gait belt;Oxygen ?Activity Tolerance: Patient tolerated treatment well ?Patient left: in bed;with call bell/phone within reach;with nursing/sitter in room (nurse to finish setting pt up  when finished with pt care) ?Nurse Communication: Mobility status;Precautions ?PT Visit Diagnosis: Other abnormalities of gait and mobility (R26.89);Muscle weakness (generalized) (M62.81) ?  ? ?Time: [phone] ?PT Time Calculation (min) (ACUTE ONLY): 46 min ? ? ?Charges:   PT Evaluation ?$PT Eval Low Complexity: 1 Low ?PT Treatments ?$Gait Training: 8-22 mins ?$Therapeutic Activity: 8-22 mins ?  ?   ? ?Leitha Bleak, PT ?04/25/21, 10:08 AM ? ? ?

## 2021-04-27 ENCOUNTER — Ambulatory Visit: Payer: BC Managed Care – PPO | Admitting: Nurse Practitioner

## 2021-04-27 ENCOUNTER — Encounter: Payer: Self-pay | Admitting: Urology

## 2021-04-27 ENCOUNTER — Encounter: Payer: Self-pay | Admitting: Nurse Practitioner

## 2021-04-27 ENCOUNTER — Ambulatory Visit: Payer: BC Managed Care – PPO | Admitting: Urology

## 2021-04-27 ENCOUNTER — Other Ambulatory Visit: Payer: Self-pay

## 2021-04-27 VITALS — BP 142/82 | HR 82 | Ht 64.0 in | Wt 350.0 lb

## 2021-04-27 VITALS — BP 140/72 | HR 96 | Ht 64.0 in | Wt 350.0 lb

## 2021-04-27 DIAGNOSIS — I2782 Chronic pulmonary embolism: Secondary | ICD-10-CM | POA: Diagnosis not present

## 2021-04-27 DIAGNOSIS — I248 Other forms of acute ischemic heart disease: Secondary | ICD-10-CM

## 2021-04-27 DIAGNOSIS — N200 Calculus of kidney: Secondary | ICD-10-CM | POA: Diagnosis not present

## 2021-04-27 DIAGNOSIS — I4819 Other persistent atrial fibrillation: Secondary | ICD-10-CM | POA: Diagnosis not present

## 2021-04-27 MED ORDER — ATORVASTATIN CALCIUM 20 MG PO TABS: 20.0000 mg | ORAL_TABLET | Freq: Every day | ORAL | 3 refills | Status: DC

## 2021-04-27 MED ORDER — APIXABAN 5 MG PO TABS: 5.0000 mg | ORAL_TABLET | Freq: Two times a day (BID) | ORAL | 3 refills | Status: DC

## 2021-04-27 MED ORDER — METOPROLOL SUCCINATE ER 25 MG PO TB24: 25.0000 mg | ORAL_TABLET | Freq: Every day | ORAL | 3 refills | Status: DC

## 2021-04-27 MED ORDER — AMLODIPINE BESYLATE 5 MG PO TABS: 5.0000 mg | ORAL_TABLET | Freq: Every day | ORAL | 3 refills | Status: DC

## 2021-04-27 NOTE — Patient Instructions (Signed)
Medication Instructions:  ?No changes at this time. ? ?*If you need a refill on your cardiac medications before your next appointment, please call your pharmacy* ? ? ?Lab Work: ?None ? ?If you have labs (blood work) drawn today and your tests are completely normal, you will receive your results only by: ?MyChart Message (if you have MyChart) OR ?A paper copy in the mail ?If you have any lab test that is abnormal or we need to change your treatment, we will call you to review the results. ? ? ?Testing/Procedures: ?Your physician has requested that you have a limited echocardiogram. Echocardiography is a painless test that uses sound waves to create images of your heart. It provides your doctor with information about the size and shape of your heart and how well your heart?s chambers and valves are working. This procedure takes approximately one hour. There are no restrictions for this procedure. ? ? ? ?Follow-Up: ?At [facility], you and your health needs are our priority.  As part of our continuing mission to provide you with exceptional heart care, we have created designated Provider Care Teams.  These Care Teams include your primary Cardiologist (physician) and Advanced Practice Providers (APPs -  Physician Assistants and Nurse Practitioners) who all work together to provide you with the care you need, when you need it. ? ? ?Your next appointment:   ?3 month(s) ? ?The format for your next appointment:   ?In Person ? ?Provider:   ?Kathlyn Sacramento, MD or Murray Hodgkins, NP ?

## 2021-04-27 NOTE — Progress Notes (Signed)
? ? ?Office Visit  ?  ?Patient Name: Nicole Schmidt ?Date of Encounter: 04/27/2021 ? ?Primary Care Provider:  Lenard Simmer, MD ?Primary Cardiologist:  Kathlyn Sacramento, MD ? ?Chief Complaint  ?  ?56 year old female with a history of breast cancer status post radiation, chronic HFpEF, permanent atrial fibrillation, hypertension, hyperlipidemia, type 2 diabetes mellitus, end-stage renal disease, anemia, obesity, hypothyroidism, pulmonary embolism, and hypothyroidism, who presents for follow-up after recent hospitalization for altered mental status and respiratory failure with demand ischemia. ? ?Past Medical History  ?  ?Past Medical History:  ?Diagnosis Date  ? (HFpEF) heart failure with preserved ejection fraction ([place])   ? a. 10/2018 Echo: EF 60-65%, diast dysfxn, RVSP 50.7 mmHg; b. 01/2020 Echo: EF 65-70%, GrII DD; c. 01/2021 Echo: EF 60-65%, no rwma, mod LVH. Nl RV fxn, mildly enlarged RV. Mod BAE. Triv MR.  ? Acquired hypothyroidism 02/09/2020  ? Anemia   ? Arthritis   ? Breast cancer ([place]) 2014  ? right breast cancer  ? Breast cancer of upper-inner quadrant of right female breast ([place])   ? Right breast invasive CA and DCIS , 7 mm T1,N0,M0. Er/PR pos, her 2 negative.  Margins 1 mm.  ? Diabetes mellitus type 2 in obese ([place]) 02/09/2020  ? Dyslipidemia 02/09/2020  ? ESRD (end stage renal disease) ([place])   ? a. 02/2021 HD initiated.  ? Essential hypertension   ? Gout   ? Hypothyroidism   ? Menopause   ? age 57  ? Morbid obesity ([place])   ? Permanent atrial fibrillation ([place])   ? a. Dx 10/2018 in setting of PNA. CHA2DS2VASc = 4-->Eliquis; b. 11/2018 s/p successful DCCV; c. Subsequent recurrent Afib-->rate-control strategy.  ? Personal history of radiation therapy   ? Pulmonary embolism ([place]) 10/2014  ? a. Chronic eliquis.  ? Thyroid goiter   ? ?Past Surgical History:  ?Procedure Laterality Date  ? BREAST BIOPSY Right 2014  ? breast ca  ? BREAST EXCISIONAL BIOPSY Right 07/19/2012  ? breast ca  ? BREAST SURGERY Right  2014  ? with sentinel node bx subareaolar duct excision  ? CARDIOVERSION N/A 11/28/2018  ? Procedure: CARDIOVERSION;  Surgeon: Minna Merritts, MD;  Location: [facility] ORS;  Service: Cardiovascular;  Laterality: N/A;  ? COLONOSCOPY WITH PROPOFOL N/A 01/30/2017  ? Procedure: COLONOSCOPY WITH PROPOFOL;  Surgeon: Christene Lye, MD;  Location: [facility] ENDOSCOPY;  Service: Endoscopy;  Laterality: N/A;  ? CYSTOSCOPY W/ URETERAL STENT PLACEMENT Left 02/14/2021  ? Procedure: CYSTOSCOPY WITH RETROGRADE PYELOGRAM/URETERAL STENT PLACEMENT;  Surgeon: Billey Co, MD;  Location: [facility] ORS;  Service: Urology;  Laterality: Left;  ? DIALYSIS/PERMA CATHETER INSERTION N/A 02/24/2021  ? Procedure: DIALYSIS/PERMA CATHETER INSERTION;  Surgeon: Algernon Huxley, MD;  Location: [place] CV LAB;  Service: Cardiovascular;  Laterality: N/A;  ? ? ?Allergies ? ?Allergies  ?Allergen Reactions  ? Spironolactone   ?  Hyperkalemia  ? ? ?History of Present Illness  ?  ?56 year old female with the above past medical history including breast cancer status post radiation, chronic HFpEF, permanent atrial fibrillation, hypertension, hyperlipidemia, type 2 diabetes mellitus, end-stage renal disease, anemia, obesity, hypothyroidism, pulmonary embolism, and hypothyroidism.  She has been on chronic Eliquis following PE in September 2016.  She was diagnosed with atrial fibrillation in September 2020 and subsequently underwent cardioversion in October 2020 without significant improvement in baseline level of dyspnea and fatigue.  She was hospitalized in December 2021 for respiratory failure secondary to heart failure and pneumonia.  Echocardiogram at  that time showed an EF of 65 to 70% with severe LVH.  Spironolactone was discontinued secondary to hyperkalemia.  At office follow-up in July 2022, she was noted to be back in atrial fibrillation and was minimally symptomatic.  It was felt that due to comorbidities, maintaining sinus rhythm would be  difficult and in the absence of significant symptoms, decision was made to pursue a rate control strategy.  She was doing well at cardiology follow-up in October 2022. ? ?Unfortunate, Ms. Cayton was admitted to [facility] on February 01, 2021 secondary to acute on chronic diastolic heart failure and acute kidney injury.  Creatinine was 5.82 on admission.  Renal function failed to improve and she subsequently underwent permacath placement and hemodialysis was initiated February 25, 2021.  During admission, she required left ureteral stenting by urology.  She was subsequently discharged to inpatient rehab at [facility] and later discharged on February 1.  She was readmitted to [facility] 1 day later in the setting of acute on chronic hypoxic and hypercapnic respiratory failure, as well as diastolic heart failure.  Dialysis with ultrafiltration was continued.  She also was diagnosed with UTI and was seen by infectious disease.  In the setting of ongoing debility, she was again discharged to inpatient rehab on February 13, where she stayed until March 3.  In the setting of chronic hypercapnic respiratory failure, she was discharged home with plan for BiPAP usage as needed.  She has since been using a trilogy device, and has tolerated this well.  On the morning of March 12 however, she awoke and felt she needed to use the bathroom.  She took off her mask and remembers having pain, though she does not member where she was experiencing pain.  She then remembers picking up her phone but not being able to figure out how to use it.  Some point later in the morning, when she was not answering her phone, a cousin came to her home to check on her and found her confused.  Her trilogy mask was reapplied and patient says that within a few minutes, she was more lucid.  EMS was called and she was taken to the [facility] ED, where she was stable on arrival.  She was noted to have mild troponin elevation, peaking at 164 and  then trending down.  She was not complaining of chest pain or dyspnea and she was discharged home with recommendation for cardiology follow-up. ? ?Since her discharge on March 13, she has felt well.  She is more or less chair and bedbound.  She goes to dialysis on Tuesdays, Thursdays, and Saturdays, and has tolerated this well.  She does not experience chest pain, though she does have chronic dyspnea on exertion, which is unchanged.  She denies palpitations, PND, orthopnea, dizziness, syncope, edema, or early satiety.  She has noticed a significant improvement in urine output. ? ?Home Medications  ?  ?Current Outpatient Medications  ?Medication Sig Dispense Refill  ? acetaminophen (TYLENOL) 325 MG tablet Take 2 tablets (650 mg total) by mouth every 6 (six) hours as needed for mild pain (or Fever >/= 101).    ? ADVAIR DISKUS 250-50 MCG/ACT AEPB 2 (two) times daily.    ? albuterol (VENTOLIN HFA) 108 (90 Base) MCG/ACT inhaler Inhale 2 puffs into the lungs every 6 (six) hours as needed for wheezing or shortness of breath. 8.5 g 0  ? calcitRIOL (ROCALTROL) 0.25 MCG capsule Take 1 capsule (0.25 mcg total) by mouth daily. Delta  capsule 0  ? calcium carbonate (OS-CAL - DOSED IN MG OF ELEMENTAL CALCIUM) 1250 (500 Ca) MG tablet Take 1 tablet (1,250 mg total) by mouth daily with breakfast.    ? diclofenac Sodium (VOLTAREN) 1 % GEL Apply 2 g topically 4 (four) times daily. 100 g 0  ? doxycycline (VIBRA-TABS) 100 MG tablet Take 100 mg by mouth 2 (two) times daily.    ? ergocalciferol (VITAMIN D2) 1.25 MG (50000 UT) capsule Take 1 capsule (50,000 Units total) by mouth once a week. 5 capsule 0  ? famotidine (PEPCID) 10 MG tablet Take 1 tablet (10 mg total) by mouth daily. 30 tablet 0  ? ferric gluconate 250 mg in sodium chloride 0.9 % 250 mL Inject 250 mg into the vein Every Tuesday,Thursday,and Saturday with dialysis.    ? folic acid (FOLVITE) 1 MG tablet Take 1 tablet (1 mg total) by mouth daily. 30 tablet 0  ? gabapentin  (NEURONTIN) 300 MG capsule Take 1 capsule (300 mg total) by mouth at bedtime. 30 capsule 0  ? levothyroxine (SYNTHROID) 112 MCG tablet Take 1 tablet (112 mcg total) by mouth daily before breakfast. 30 tablet 0  ? melat

## 2021-04-27 NOTE — Patient Instructions (Signed)
Laser Therapy for Kidney Stones ?Laser therapy for kidney stones is a procedure to break up small, hard mineral deposits that form in the kidney (kidney stones). The procedure is done using a device that produces a focused beam of light (laser). The laser breaks up kidney stones into pieces that are small enough to be passed out of the body through urination or removed from the body during the procedure. You may need laser therapy if you have kidney stones that are painful or block your urinary tract. ?This procedure is done by inserting a tube (ureteroscope) into your kidney through the urethral opening. The urethra is the part of the body that drains urine from the bladder. In women, the urethra opens above the vaginal opening. In men, the urethra opens at the tip of the penis. The ureteroscope is inserted through the urethra, and surgical instruments are moved through the bladder and the muscular tube that connects the kidney to the bladder (ureter) until they reach the kidney. ?Tell a health care provider about: ?Any allergies you have. ?All medicines you are taking, including vitamins, herbs, eye drops, creams, and over-the-counter medicines. ?Any problems you or family members have had with anesthetic medicines. ?Any blood disorders you have. ?Any surgeries you have had. ?Any medical conditions you have. ?Whether you are pregnant or may be pregnant. ?What are the risks? ?Generally, this is a safe procedure. However, problems may occur, including: ?Infection. ?Bleeding. ?Allergic reactions to medicines. ?Damage to the urethra, bladder, or ureter. ?Urinary tract infection (UTI). ?Narrowing of the urethra (urethral stricture). ?Difficulty passing urine. ?Blockage of the kidney caused by a fragment of kidney stone. ?What happens before the procedure? ?Medicines ?Ask your health care provider about: ?Changing or stopping your regular medicines. This is especially important if you are taking diabetes medicines or  blood thinners. ?Taking medicines such as aspirin and ibuprofen. These medicines can thin your blood. Do not take these medicines unless your health care provider tells you to take them. ?Taking over-the-counter medicines, vitamins, herbs, and supplements. ?Eating and drinking ?Follow instructions from your health care provider about eating and drinking, which may include: ?8 hours before the procedure - stop eating heavy meals or foods, such as meat, fried foods, or fatty foods. ?6 hours before the procedure - stop eating light meals or foods, such as toast or cereal. ?6 hours before the procedure - stop drinking milk or drinks that contain milk. ?2 hours before the procedure - stop drinking clear liquids. ?Staying hydrated ?Follow instructions from your health care provider about hydration, which may include: ?Up to 2 hours before the procedure - you may continue to drink clear liquids, such as water, clear fruit juice, black coffee, and plain tea. ? ?General instructions ?You may have a physical exam before the procedure. You may also have tests, such as imaging tests and blood or urine tests. ?If your ureter is too narrow, your health care provider may place a soft, flexible tube (stent) inside of it. The stent may be placed days or weeks before your laser therapy procedure. ?Plan to have someone take you home from the hospital or clinic. ?If you will be going home right after the procedure, plan to have someone stay with you for 24 hours. ?Do not use any products that contain nicotine or tobacco for at least 4 weeks before the procedure. These products include cigarettes, e-cigarettes, and chewing tobacco. If you need help quitting, ask your health care provider. ?Ask your health care provider: ?How your  surgical site will be marked or identified. ?What steps will be taken to help prevent infection. These may include: ?Removing hair at the surgery site. ?Washing skin with a germ-killing soap. ?Taking antibiotic  medicine. ?What happens during the procedure? ? ?An IV will be inserted into one of your veins. ?You will be given one or more of the following: ?A medicine to help you relax (sedative). ?A medicine to numb the area (local anesthetic). ?A medicine to make you fall asleep (general anesthetic). ?A ureteroscope will be inserted into your urethra. The ureteroscope will send images to a video screen in the operating room to guide your surgeon to the area of your kidney that will be treated. ?A small, flexible tube will be threaded through the ureteroscope and into your bladder and ureter, up to your kidney. ?The laser device will be inserted into your kidney through the tube. Your surgeon will pulse the laser on and off to break up kidney stones. ?A surgical instrument that has a tiny wire basket may be inserted through the tube into your kidney to remove the pieces of broken kidney stone. ?The procedure may vary among health care providers and hospitals. ?What happens after the procedure? ?Your blood pressure, heart rate, breathing rate, and blood oxygen level will be monitored until you leave the hospital or clinic. ?You will be given pain medicine as needed. ?You may continue to receive antibiotics. ?You may have a stent temporarily placed in your ureter. ?Do not drive for 24 hours if you were given a sedative during your procedure. ?You may be given a strainer to collect any stone fragments that you pass in your urine. Your health care provider may have these tested. ?Summary ?Laser therapy for kidney stones is a procedure to break up kidney stones into pieces that are small enough to be passed out of the body through urination or removed during the procedure. ?Follow instructions from your health care provider about eating and drinking before the procedure. ?During the procedure, the ureteroscope will send images to a video screen to guide your surgeon to the area of your kidney that will be treated. ?Do not drive  for 24 hours if you were given a sedative during your procedure. ?This information is not intended to replace advice given to you by your health care provider. Make sure you discuss any questions you have with your health care provider. ?Document Revised: 10/04/2020 Document Reviewed: 10/04/2020 ?Elsevier Patient Education ? [place]. ? ?

## 2021-04-27 NOTE — Progress Notes (Signed)
? ?  04/27/2021 ?1:19 PM  ? ?[name] ?09/30/1965 ?[id number] ? ?Reason for visit: Follow up left proximal ureteral stone, renal failure ? ?HPI: ?56 year old extremely comorbid female with morbid obesity and BMI of 60, CHF, CKD with prior baseline creatinine of 1.8, diabetes, history of PE on chronic anticoagulation, and pulmonary disease on home oxygen.  She was admitted with possible pneumonia in late December 2022, and CT ultimately showed a 7 mm left proximal ureteral stone and in the setting of her worsening renal function with a creatinine of 5.5(eGFR 8) at that time she underwent urgent left ureteral stent placement.  Unfortunately her renal function did not significantly improve, and she has been on dialysis despite stenting.  She has had multiple admissions since that time for hypoxia and hypercapnic respiratory failure, also diagnosed with possible UTI in February, however this was more likely asymptomatic bacteriuria. ? ?She reports she has been making more urine lately, and she continues to follow closely with nephrology.  Follow-up renal ultrasound showed no hydronephrosis with stent in place. ? ?We discussed need for definitive left ureteroscopy/laser/stent change in the future, but with her numerous hospitalizations and overall poor health, I do not think we should be in a rush for this elective procedure, and the stent can stay in 3 to 6 months before being changed.  She and her cousin are in agreement. ? ?I recommended repeat follow-up in 6-8 weeks to see how she is doing, and consider looking to schedule left ureteroscopy, laser lithotripsy, stent change/removal at that time if she continues to improve in terms of her respiratory status. ? ?I spent 35 total minutes on the day of the encounter including pre-visit review of the medical record, face-to-face time with the patient, and post visit ordering of labs/imaging/tests. ? ? ? ?Billey Co, MD ? ?[place] ?[address] ?[place], [place] [address] ?[phone] ? ?

## 2021-04-29 ENCOUNTER — Other Ambulatory Visit: Payer: Self-pay | Admitting: Nurse Practitioner

## 2021-04-29 DIAGNOSIS — D509 Iron deficiency anemia, unspecified: Secondary | ICD-10-CM | POA: Diagnosis not present

## 2021-04-29 DIAGNOSIS — I2782 Chronic pulmonary embolism: Secondary | ICD-10-CM

## 2021-04-29 DIAGNOSIS — E785 Hyperlipidemia, unspecified: Secondary | ICD-10-CM | POA: Diagnosis not present

## 2021-04-29 DIAGNOSIS — E1122 Type 2 diabetes mellitus with diabetic chronic kidney disease: Secondary | ICD-10-CM | POA: Diagnosis not present

## 2021-04-29 DIAGNOSIS — D631 Anemia in chronic kidney disease: Secondary | ICD-10-CM | POA: Diagnosis not present

## 2021-04-29 DIAGNOSIS — J9611 Chronic respiratory failure with hypoxia: Secondary | ICD-10-CM | POA: Diagnosis not present

## 2021-04-29 DIAGNOSIS — G4733 Obstructive sleep apnea (adult) (pediatric): Secondary | ICD-10-CM | POA: Diagnosis not present

## 2021-04-29 DIAGNOSIS — I132 Hypertensive heart and chronic kidney disease with heart failure and with stage 5 chronic kidney disease, or end stage renal disease: Secondary | ICD-10-CM | POA: Diagnosis not present

## 2021-04-29 DIAGNOSIS — N186 End stage renal disease: Secondary | ICD-10-CM | POA: Diagnosis not present

## 2021-04-29 DIAGNOSIS — E039 Hypothyroidism, unspecified: Secondary | ICD-10-CM | POA: Diagnosis not present

## 2021-04-29 DIAGNOSIS — I214 Non-ST elevation (NSTEMI) myocardial infarction: Secondary | ICD-10-CM | POA: Diagnosis not present

## 2021-04-29 DIAGNOSIS — I480 Paroxysmal atrial fibrillation: Secondary | ICD-10-CM | POA: Diagnosis not present

## 2021-04-29 DIAGNOSIS — I5030 Unspecified diastolic (congestive) heart failure: Secondary | ICD-10-CM | POA: Diagnosis not present

## 2021-04-29 DIAGNOSIS — J9622 Acute and chronic respiratory failure with hypercapnia: Secondary | ICD-10-CM | POA: Diagnosis not present

## 2021-04-29 MED ORDER — ATORVASTATIN CALCIUM 20 MG PO TABS: 20.0000 mg | ORAL_TABLET | Freq: Every day | ORAL | 3 refills | Status: DC

## 2021-04-29 MED ORDER — APIXABAN 5 MG PO TABS: 5.0000 mg | ORAL_TABLET | Freq: Two times a day (BID) | ORAL | 1 refills | Status: DC

## 2021-04-29 MED ORDER — METOPROLOL SUCCINATE ER 25 MG PO TB24: 25.0000 mg | ORAL_TABLET | Freq: Every day | ORAL | 3 refills | Status: DC

## 2021-04-29 MED ORDER — AMLODIPINE BESYLATE 5 MG PO TABS: 5.0000 mg | ORAL_TABLET | Freq: Every day | ORAL | 3 refills | Status: DC

## 2021-04-29 NOTE — Telephone Encounter (Signed)
Eliquis 5 mg refill request received. Patient is 56 years old, weight- 158.8 kg, Crea- 3.55 on 04/25/21, Diagnosis- afib, and last seen by Ignacia Bayley, NP on 04/27/21. Dose is appropriate based on dosing criteria. Will send in refill to requested pharmacy.   ?

## 2021-04-29 NOTE — Telephone Encounter (Signed)
?*  STAT* If patient is at the pharmacy, call can be transferred to refill team. ? ? ?1. Which medications need to be refilled? (please list name of each medication and dose if known)  ? ?Eliquis  ?Atorvastatin  ?Amlodipine  ?Metoprolol  ? ? ?2. Which pharmacy/location (including street and city if local pharmacy) is medication to be sent to? [facility] [address]  ? ?3. Do they need a 30 day or 90 day supply? 90 ? ?Seen 3-15 but [facility] denies receiving attempted to call pharmacy without response   ?

## 2021-05-04 DIAGNOSIS — E1122 Type 2 diabetes mellitus with diabetic chronic kidney disease: Secondary | ICD-10-CM | POA: Diagnosis not present

## 2021-05-04 DIAGNOSIS — D509 Iron deficiency anemia, unspecified: Secondary | ICD-10-CM | POA: Diagnosis not present

## 2021-05-04 DIAGNOSIS — D631 Anemia in chronic kidney disease: Secondary | ICD-10-CM | POA: Diagnosis not present

## 2021-05-04 DIAGNOSIS — I214 Non-ST elevation (NSTEMI) myocardial infarction: Secondary | ICD-10-CM | POA: Diagnosis not present

## 2021-05-04 DIAGNOSIS — G4733 Obstructive sleep apnea (adult) (pediatric): Secondary | ICD-10-CM | POA: Diagnosis not present

## 2021-05-04 DIAGNOSIS — N186 End stage renal disease: Secondary | ICD-10-CM | POA: Diagnosis not present

## 2021-05-04 DIAGNOSIS — J9622 Acute and chronic respiratory failure with hypercapnia: Secondary | ICD-10-CM | POA: Diagnosis not present

## 2021-05-04 DIAGNOSIS — J9611 Chronic respiratory failure with hypoxia: Secondary | ICD-10-CM | POA: Diagnosis not present

## 2021-05-04 DIAGNOSIS — I480 Paroxysmal atrial fibrillation: Secondary | ICD-10-CM | POA: Diagnosis not present

## 2021-05-04 DIAGNOSIS — E785 Hyperlipidemia, unspecified: Secondary | ICD-10-CM | POA: Diagnosis not present

## 2021-05-04 DIAGNOSIS — E039 Hypothyroidism, unspecified: Secondary | ICD-10-CM | POA: Diagnosis not present

## 2021-05-04 DIAGNOSIS — I132 Hypertensive heart and chronic kidney disease with heart failure and with stage 5 chronic kidney disease, or end stage renal disease: Secondary | ICD-10-CM | POA: Diagnosis not present

## 2021-05-04 DIAGNOSIS — I5030 Unspecified diastolic (congestive) heart failure: Secondary | ICD-10-CM | POA: Diagnosis not present

## 2021-05-09 DIAGNOSIS — N184 Chronic kidney disease, stage 4 (severe): Secondary | ICD-10-CM | POA: Diagnosis not present

## 2021-05-09 DIAGNOSIS — E559 Vitamin D deficiency, unspecified: Secondary | ICD-10-CM | POA: Diagnosis not present

## 2021-05-09 DIAGNOSIS — E1165 Type 2 diabetes mellitus with hyperglycemia: Secondary | ICD-10-CM | POA: Diagnosis not present

## 2021-05-09 DIAGNOSIS — C50819 Malignant neoplasm of overlapping sites of unspecified female breast: Secondary | ICD-10-CM | POA: Diagnosis not present

## 2021-05-11 DIAGNOSIS — D631 Anemia in chronic kidney disease: Secondary | ICD-10-CM | POA: Diagnosis not present

## 2021-05-11 DIAGNOSIS — J9622 Acute and chronic respiratory failure with hypercapnia: Secondary | ICD-10-CM | POA: Diagnosis not present

## 2021-05-11 DIAGNOSIS — I214 Non-ST elevation (NSTEMI) myocardial infarction: Secondary | ICD-10-CM | POA: Diagnosis not present

## 2021-05-11 DIAGNOSIS — E785 Hyperlipidemia, unspecified: Secondary | ICD-10-CM | POA: Diagnosis not present

## 2021-05-11 DIAGNOSIS — E1122 Type 2 diabetes mellitus with diabetic chronic kidney disease: Secondary | ICD-10-CM | POA: Diagnosis not present

## 2021-05-11 DIAGNOSIS — I480 Paroxysmal atrial fibrillation: Secondary | ICD-10-CM | POA: Diagnosis not present

## 2021-05-11 DIAGNOSIS — I5030 Unspecified diastolic (congestive) heart failure: Secondary | ICD-10-CM | POA: Diagnosis not present

## 2021-05-11 DIAGNOSIS — J9611 Chronic respiratory failure with hypoxia: Secondary | ICD-10-CM | POA: Diagnosis not present

## 2021-05-11 DIAGNOSIS — N186 End stage renal disease: Secondary | ICD-10-CM | POA: Diagnosis not present

## 2021-05-11 DIAGNOSIS — D509 Iron deficiency anemia, unspecified: Secondary | ICD-10-CM | POA: Diagnosis not present

## 2021-05-11 DIAGNOSIS — G4733 Obstructive sleep apnea (adult) (pediatric): Secondary | ICD-10-CM | POA: Diagnosis not present

## 2021-05-11 DIAGNOSIS — I132 Hypertensive heart and chronic kidney disease with heart failure and with stage 5 chronic kidney disease, or end stage renal disease: Secondary | ICD-10-CM | POA: Diagnosis not present

## 2021-05-11 DIAGNOSIS — E039 Hypothyroidism, unspecified: Secondary | ICD-10-CM | POA: Diagnosis not present

## 2021-05-13 DIAGNOSIS — Z992 Dependence on renal dialysis: Secondary | ICD-10-CM | POA: Diagnosis not present

## 2021-05-13 DIAGNOSIS — N186 End stage renal disease: Secondary | ICD-10-CM | POA: Diagnosis not present

## 2021-05-16 DIAGNOSIS — N184 Chronic kidney disease, stage 4 (severe): Secondary | ICD-10-CM | POA: Diagnosis not present

## 2021-05-16 DIAGNOSIS — E1165 Type 2 diabetes mellitus with hyperglycemia: Secondary | ICD-10-CM | POA: Diagnosis not present

## 2021-05-16 DIAGNOSIS — J9610 Chronic respiratory failure, unspecified whether with hypoxia or hypercapnia: Secondary | ICD-10-CM | POA: Diagnosis not present

## 2021-05-16 DIAGNOSIS — D352 Benign neoplasm of pituitary gland: Secondary | ICD-10-CM | POA: Diagnosis not present

## 2021-05-16 DIAGNOSIS — I5043 Acute on chronic combined systolic (congestive) and diastolic (congestive) heart failure: Secondary | ICD-10-CM | POA: Diagnosis not present

## 2021-05-16 DIAGNOSIS — E039 Hypothyroidism, unspecified: Secondary | ICD-10-CM | POA: Diagnosis not present

## 2021-05-18 ENCOUNTER — Ambulatory Visit (INDEPENDENT_AMBULATORY_CARE_PROVIDER_SITE_OTHER): Payer: BC Managed Care – PPO

## 2021-05-18 DIAGNOSIS — J9622 Acute and chronic respiratory failure with hypercapnia: Secondary | ICD-10-CM | POA: Diagnosis not present

## 2021-05-18 DIAGNOSIS — I248 Other forms of acute ischemic heart disease: Secondary | ICD-10-CM | POA: Diagnosis not present

## 2021-05-18 DIAGNOSIS — J9611 Chronic respiratory failure with hypoxia: Secondary | ICD-10-CM | POA: Diagnosis not present

## 2021-05-18 DIAGNOSIS — N186 End stage renal disease: Secondary | ICD-10-CM | POA: Diagnosis not present

## 2021-05-18 DIAGNOSIS — I5030 Unspecified diastolic (congestive) heart failure: Secondary | ICD-10-CM | POA: Diagnosis not present

## 2021-05-18 DIAGNOSIS — G4733 Obstructive sleep apnea (adult) (pediatric): Secondary | ICD-10-CM | POA: Diagnosis not present

## 2021-05-18 DIAGNOSIS — E1122 Type 2 diabetes mellitus with diabetic chronic kidney disease: Secondary | ICD-10-CM | POA: Diagnosis not present

## 2021-05-18 DIAGNOSIS — E785 Hyperlipidemia, unspecified: Secondary | ICD-10-CM | POA: Diagnosis not present

## 2021-05-18 DIAGNOSIS — I480 Paroxysmal atrial fibrillation: Secondary | ICD-10-CM | POA: Diagnosis not present

## 2021-05-18 DIAGNOSIS — E039 Hypothyroidism, unspecified: Secondary | ICD-10-CM | POA: Diagnosis not present

## 2021-05-18 DIAGNOSIS — D509 Iron deficiency anemia, unspecified: Secondary | ICD-10-CM | POA: Diagnosis not present

## 2021-05-18 DIAGNOSIS — D631 Anemia in chronic kidney disease: Secondary | ICD-10-CM | POA: Diagnosis not present

## 2021-05-18 DIAGNOSIS — I132 Hypertensive heart and chronic kidney disease with heart failure and with stage 5 chronic kidney disease, or end stage renal disease: Secondary | ICD-10-CM | POA: Diagnosis not present

## 2021-05-18 DIAGNOSIS — I214 Non-ST elevation (NSTEMI) myocardial infarction: Secondary | ICD-10-CM | POA: Diagnosis not present

## 2021-05-18 LAB — ECHOCARDIOGRAM LIMITED: S' Lateral: 3.2 cm

## 2021-05-18 MED ORDER — PERFLUTREN LIPID MICROSPHERE
1.0000 mL | INTRAVENOUS | Status: AC | PRN
  Administered 2021-05-18: 2 mL via INTRAVENOUS

## 2021-05-23 DIAGNOSIS — I5032 Chronic diastolic (congestive) heart failure: Secondary | ICD-10-CM | POA: Diagnosis not present

## 2021-05-23 DIAGNOSIS — J9601 Acute respiratory failure with hypoxia: Secondary | ICD-10-CM | POA: Diagnosis not present

## 2021-05-23 DIAGNOSIS — I482 Chronic atrial fibrillation, unspecified: Secondary | ICD-10-CM | POA: Diagnosis not present

## 2021-05-23 DIAGNOSIS — G4733 Obstructive sleep apnea (adult) (pediatric): Secondary | ICD-10-CM | POA: Diagnosis not present

## 2021-05-25 ENCOUNTER — Ambulatory Visit
Admission: RE | Admit: 2021-05-25 | Discharge: 2021-05-25 | Disposition: A | Discharge status: Home / Self Care | Payer: BC Managed Care – PPO | Source: Ambulatory Visit

## 2021-05-25 ENCOUNTER — Telehealth: Payer: Self-pay | Admitting: Pulmonary Disease

## 2021-05-25 VITALS — BP 143/73 | HR 70 | Temp 97.9°F | Resp 18

## 2021-05-25 DIAGNOSIS — J9622 Acute and chronic respiratory failure with hypercapnia: Secondary | ICD-10-CM | POA: Diagnosis not present

## 2021-05-25 DIAGNOSIS — E1122 Type 2 diabetes mellitus with diabetic chronic kidney disease: Secondary | ICD-10-CM | POA: Diagnosis not present

## 2021-05-25 DIAGNOSIS — I5030 Unspecified diastolic (congestive) heart failure: Secondary | ICD-10-CM | POA: Diagnosis not present

## 2021-05-25 DIAGNOSIS — I480 Paroxysmal atrial fibrillation: Secondary | ICD-10-CM | POA: Diagnosis not present

## 2021-05-25 DIAGNOSIS — G4733 Obstructive sleep apnea (adult) (pediatric): Secondary | ICD-10-CM | POA: Diagnosis not present

## 2021-05-25 DIAGNOSIS — D631 Anemia in chronic kidney disease: Secondary | ICD-10-CM | POA: Diagnosis not present

## 2021-05-25 DIAGNOSIS — J9611 Chronic respiratory failure with hypoxia: Secondary | ICD-10-CM | POA: Diagnosis not present

## 2021-05-25 DIAGNOSIS — D509 Iron deficiency anemia, unspecified: Secondary | ICD-10-CM | POA: Diagnosis not present

## 2021-05-25 DIAGNOSIS — L89322 Pressure ulcer of left buttock, stage 2: Secondary | ICD-10-CM | POA: Diagnosis not present

## 2021-05-25 DIAGNOSIS — E039 Hypothyroidism, unspecified: Secondary | ICD-10-CM | POA: Diagnosis not present

## 2021-05-25 DIAGNOSIS — I132 Hypertensive heart and chronic kidney disease with heart failure and with stage 5 chronic kidney disease, or end stage renal disease: Secondary | ICD-10-CM | POA: Diagnosis not present

## 2021-05-25 DIAGNOSIS — N186 End stage renal disease: Secondary | ICD-10-CM | POA: Diagnosis not present

## 2021-05-25 DIAGNOSIS — I214 Non-ST elevation (NSTEMI) myocardial infarction: Secondary | ICD-10-CM | POA: Diagnosis not present

## 2021-05-25 DIAGNOSIS — E785 Hyperlipidemia, unspecified: Secondary | ICD-10-CM | POA: Diagnosis not present

## 2021-05-25 NOTE — ED Triage Notes (Signed)
Pt presents with a bump on her bottom for about 2 weeks. Pt states it hurts when she sits in her recliner.  ?

## 2021-05-25 NOTE — Telephone Encounter (Signed)
Called Cloe from [facility] and she was stating that when she gets there in the morning patient is not wearing her oxygen but her oxygen stats are 94%-95%. She is wondering if it is ok for her to titrate her off of the 2L or completely take her off of it if her oxygen stays about 88%. I told Cloe I would ask Dr Halford Chessman what he would like to do for this patient and that one of [name] would get back in touch with her with his response. ? ?Dr Halford Chessman please advise  ?

## 2021-05-25 NOTE — ED Provider Notes (Signed)
? ? ?[facility] ? ? ? ?CSN: [id number] ?Arrival date & time: 05/25/21  1253 ? ? ?  ? ?History   ?Chief Complaint ?Chief Complaint  ?Patient presents with  ? Bump  ? ? ?HPI ?Nicole Schmidt is a 56 y.o. female.  ? ?Pleasant 56yo female with multiple comorbidities presents today due to a sore spot her her left medial buttock. She is uncertain how long it has been present, but believes at least two weeks. It has been getting more uncomfortable. She states it primarily gives her pain when sitting in her recliner at home. She admits to being really sick recently, was just recently discharged from the hospital in which she was bed bound most of the time. She has been on dialysis T/Th/Sat since January and is in dialysis chair for 4+ hours routinely. She had a friend look at the sore and thought it was a "pimple." Pt has not tried any treatments to the area routinely since she cannot reach it. Her friend applied "some type of butt cream from the hospital twice". Pt denies fever, spreading rash, drainage or any other concerns at this time. ? ? ? ?Past Medical History:  ?Diagnosis Date  ? (HFpEF) heart failure with preserved ejection fraction ([place])   ? a. 10/2018 Echo: EF 60-65%, diast dysfxn, RVSP 50.7 mmHg; b. 01/2020 Echo: EF 65-70%, GrII DD; c. 01/2021 Echo: EF 60-65%, no rwma, mod LVH. Nl RV fxn, mildly enlarged RV. Mod BAE. Triv MR.  ? Acquired hypothyroidism 02/09/2020  ? Anemia   ? Arthritis   ? Breast cancer ([place]) 2014  ? right breast cancer  ? Breast cancer of upper-inner quadrant of right female breast ([place])   ? Right breast invasive CA and DCIS , 7 mm T1,N0,M0. Er/PR pos, her 2 negative.  Margins 1 mm.  ? Diabetes mellitus type 2 in obese ([place]) 02/09/2020  ? Dyslipidemia 02/09/2020  ? ESRD (end stage renal disease) ([place])   ? a. 02/2021 HD initiated.  ? Essential hypertension   ? Gout   ? Hypothyroidism   ? Menopause   ? age 86  ? Morbid obesity ([place])   ? Permanent atrial fibrillation ([place])   ? a. Dx 10/2018  in setting of PNA. CHA2DS2VASc = 4-->Eliquis; b. 11/2018 s/p successful DCCV; c. Subsequent recurrent Afib-->rate-control strategy.  ? Personal history of radiation therapy   ? Pulmonary embolism ([place]) 10/2014  ? a. Chronic eliquis.  ? Thyroid goiter   ? ? ?Patient Active Problem List  ? Diagnosis Date Noted  ? OSA (obstructive sleep apnea)   ? Hypoxia   ? Acute blood loss anemia   ? Super obese   ? Controlled type 2 diabetes mellitus with hyperglycemia, without long-term current use of insulin ([place])   ? ESRD on hemodialysis ([place])   ? Acute respiratory failure with hypoxia and hypercapnia (HCC)   ? Ambulatory dysfunction   ? Pressure injury of skin 03/21/2021  ? Acute respiratory failure with hypoxia ([place]) 03/20/2021  ? Debility 03/04/2021  ? ESRD (end stage renal disease) ([place]) 03/02/2021  ? Acute on chronic renal failure ([place]) 02/24/2021  ? Viral URI   ? Multifocal pneumonia 02/19/2021  ? Hydronephrosis with renal and ureteral calculus obstruction 02/19/2021  ? Hyperkalemia 02/19/2021  ? Cardiorenal syndrome, stage 1-4 or unspecified chronic kidney disease, with heart failure ([place]) 02/09/2021  ?  Class: Hospitalized for  ? AKI (acute kidney injury) ([place]) 02/09/2021  ? Bronchitis 02/09/2021  ? Cardiorenal syndrome with  renal failure 02/09/2021  ? Chronic respiratory failure with hypoxia ([place]) 01/13/2021  ? Physical deconditioning 03/16/2020  ? Abnormal findings on diagnostic imaging of lung 03/16/2020  ? AF (paroxysmal atrial fibrillation) ([place])   ? Acquired thrombophilia ([place])   ? Acute on chronic respiratory failure with hypoxia and hypercapnia ([place]) 02/21/2020  ? Acute on chronic respiratory failure with hypoxemia ([place]) 02/21/2020  ? Atrial fibrillation, chronic ([place]) 02/09/2020  ? Dyslipidemia 02/09/2020  ? Diabetes mellitus type 2 in obese ([place]) 02/09/2020  ? Hypothyroidism 02/09/2020  ? Thrush, oral 02/09/2020  ? Morbid obesity with BMI of 60.0-69.9, adult ([place]) 02/03/2020  ? Chronic anticoagulation  12/30/2019  ? Obstructive sleep apnea 08/04/2019  ? Atrial fibrillation with RVR ([place]) 10/18/2018  ? Acute hypoxemic respiratory failure ([place]) 03/29/2018  ? Acute on chronic diastolic CHF (congestive heart failure) ([place]) 02/04/2018  ? Lymphedema 02/04/2018  ? Essential hypertension 12/09/2017  ? Long term (current) use of aromatase inhibitors 11/17/2017  ? Elevated alkaline phosphatase level 05/20/2017  ? Goals of care, counseling/discussion 05/14/2017  ? Chronic renal disease, stage III ([place]) 07/08/2015  ? Iron deficiency anemia 07/08/2015  ? Acute renal failure superimposed on stage 4 chronic kidney disease ([place]) 10/22/2014  ? History of breast cancer 10/22/2014  ? Elevated troponin 10/22/2014  ? Pulmonary emboli ([place]) 10/18/2014  ? History of pulmonary embolus (PE) 10/18/2014  ? Candidiasis of breast 07/06/2014  ? Malignant neoplasm of right female breast ([place]) 07/24/2012  ? ? ?Past Surgical History:  ?Procedure Laterality Date  ? BREAST BIOPSY Right 2014  ? breast ca  ? BREAST EXCISIONAL BIOPSY Right 07/19/2012  ? breast ca  ? BREAST SURGERY Right 2014  ? with sentinel node bx subareaolar duct excision  ? CARDIOVERSION N/A 11/28/2018  ? Procedure: CARDIOVERSION;  Surgeon: Minna Merritts, MD;  Location: [facility] ORS;  Service: Cardiovascular;  Laterality: N/A;  ? COLONOSCOPY WITH PROPOFOL N/A 01/30/2017  ? Procedure: COLONOSCOPY WITH PROPOFOL;  Surgeon: Christene Lye, MD;  Location: [facility] ENDOSCOPY;  Service: Endoscopy;  Laterality: N/A;  ? CYSTOSCOPY W/ URETERAL STENT PLACEMENT Left 02/14/2021  ? Procedure: CYSTOSCOPY WITH RETROGRADE PYELOGRAM/URETERAL STENT PLACEMENT;  Surgeon: Billey Co, MD;  Location: [facility] ORS;  Service: Urology;  Laterality: Left;  ? DIALYSIS/PERMA CATHETER INSERTION N/A 02/24/2021  ? Procedure: DIALYSIS/PERMA CATHETER INSERTION;  Surgeon: Algernon Huxley, MD;  Location: [place] CV LAB;  Service: Cardiovascular;  Laterality: N/A;  ? ? ?OB History   ? ? Gravida  ?0  ? Para  ?    ? Term  ?   ? Preterm  ?   ? AB  ?   ? Living  ?   ?  ? ? SAB  ?   ? IAB  ?   ? Ectopic  ?   ? Multiple  ?   ? Live Births  ?   ?   ?  ? Obstetric Comments  ?1st Menstrual Cycle:  11  ?  ? ?  ? ? ? ?Home Medications   ? ?Prior to Admission medications   ?Medication Sig Start Date End Date Taking? Authorizing Provider  ?acetaminophen (TYLENOL) 325 MG tablet Take 2 tablets (650 mg total) by mouth every 6 (six) hours as needed for mild pain (or Fever >/= 101). 03/15/21   Angiulli, Lavon Paganini, PA-C  ?ADVAIR DISKUS 250-50 MCG/ACT AEPB 2 (two) times daily. 04/20/21   [provider]  ?albuterol (VENTOLIN HFA) 108 (90 Base) MCG/ACT inhaler Inhale 2 puffs into the  lungs every 6 (six) hours as needed for wheezing or shortness of breath. 04/12/21   Angiulli, Lavon Paganini, PA-C  ?amLODipine (NORVASC) 5 MG tablet Take 1 tablet (5 mg total) by mouth daily. 04/29/21   Theora Gianotti, NP  ?apixaban (ELIQUIS) 5 MG TABS tablet Take 1 tablet (5 mg total) by mouth 2 (two) times daily. 04/29/21   Theora Gianotti, NP  ?atorvastatin (LIPITOR) 20 MG tablet Take 1 tablet (20 mg total) by mouth at bedtime. 04/29/21   Theora Gianotti, NP  ?calcitRIOL (ROCALTROL) 0.25 MCG capsule Take 1 capsule (0.25 mcg total) by mouth daily. 04/12/21   Angiulli, Lavon Paganini, PA-C  ?calcium carbonate (OS-CAL - DOSED IN MG OF ELEMENTAL CALCIUM) 1250 (500 Ca) MG tablet Take 1 tablet (1,250 mg total) by mouth daily with breakfast. 04/12/21   Angiulli, Lavon Paganini, PA-C  ?cloNIDine (CATAPRES) 0.2 MG tablet Take 0.2 mg by mouth 2 (two) times daily. 05/17/21   [provider]  ?diclofenac Sodium (VOLTAREN) 1 % GEL Apply 2 g topically 4 (four) times daily. 04/12/21   Angiulli, Lavon Paganini, PA-C  ?doxycycline (VIBRA-TABS) 100 MG tablet Take 100 mg by mouth 2 (two) times daily. 04/20/21   [provider]  ?ergocalciferol (VITAMIN D2) 1.25 MG (50000 UT) capsule Take 1 capsule (50,000 Units total) by mouth once a week. 04/12/21   Angiulli,  Lavon Paganini, PA-C  ?famotidine (PEPCID) 10 MG tablet Take 1 tablet (10 mg total) by mouth daily. 04/12/21   Angiulli, Lavon Paganini, PA-C  ?ferric gluconate 250 mg in sodium chloride 0.9 % 250 mL Inject 250 mg into the

## 2021-05-25 NOTE — Discharge Instructions (Addendum)
Your sore is due to a pressure ulcer. It is currently between stage 1 and 2. ?Please purchase a donut cushion and use while sitting down over the next 2 weeks. ?Please apply Endit ointment over the area daily. This can be purchased over the counter. You may need to dress it with a Telfa pad, or a non-stick pad. ?Please follow up with your PCP. Discuss this issue with your nephrologist/ dialysis team as nutrition is of the upmost importance. Sometimes they will give you a supplement called Pro-Stat which is high in protein to help with wound care. If it persists or continues, a referral to wound care management would be recommended. ?Off-load with a donut cushion or wrapped up towel.  ?

## 2021-05-26 NOTE — Telephone Encounter (Signed)
Okay to titrate oxygen to keep goal SpO2 level > 90%. ?

## 2021-05-26 NOTE — Telephone Encounter (Signed)
Called and spoke with Nicole Schmidt. She verbalized understanding and will let the patient know. She stated that they will continue to monitor her O2 levels, especially when she is exerting herself during PT.  ? ?Nothing further needed at time of call.  ?

## 2021-05-30 ENCOUNTER — Other Ambulatory Visit: Payer: Self-pay

## 2021-05-30 DIAGNOSIS — D631 Anemia in chronic kidney disease: Secondary | ICD-10-CM | POA: Diagnosis not present

## 2021-05-30 DIAGNOSIS — I480 Paroxysmal atrial fibrillation: Secondary | ICD-10-CM | POA: Diagnosis not present

## 2021-05-30 DIAGNOSIS — E039 Hypothyroidism, unspecified: Secondary | ICD-10-CM | POA: Diagnosis not present

## 2021-05-30 DIAGNOSIS — I5030 Unspecified diastolic (congestive) heart failure: Secondary | ICD-10-CM | POA: Diagnosis not present

## 2021-05-30 DIAGNOSIS — E785 Hyperlipidemia, unspecified: Secondary | ICD-10-CM | POA: Diagnosis not present

## 2021-05-30 DIAGNOSIS — G4733 Obstructive sleep apnea (adult) (pediatric): Secondary | ICD-10-CM | POA: Diagnosis not present

## 2021-05-30 DIAGNOSIS — E1122 Type 2 diabetes mellitus with diabetic chronic kidney disease: Secondary | ICD-10-CM | POA: Diagnosis not present

## 2021-05-30 DIAGNOSIS — D509 Iron deficiency anemia, unspecified: Secondary | ICD-10-CM | POA: Diagnosis not present

## 2021-05-30 DIAGNOSIS — J9611 Chronic respiratory failure with hypoxia: Secondary | ICD-10-CM | POA: Diagnosis not present

## 2021-05-30 DIAGNOSIS — N186 End stage renal disease: Secondary | ICD-10-CM | POA: Diagnosis not present

## 2021-05-30 DIAGNOSIS — I132 Hypertensive heart and chronic kidney disease with heart failure and with stage 5 chronic kidney disease, or end stage renal disease: Secondary | ICD-10-CM | POA: Diagnosis not present

## 2021-05-30 DIAGNOSIS — I214 Non-ST elevation (NSTEMI) myocardial infarction: Secondary | ICD-10-CM | POA: Diagnosis not present

## 2021-05-30 DIAGNOSIS — J9622 Acute and chronic respiratory failure with hypercapnia: Secondary | ICD-10-CM | POA: Diagnosis not present

## 2021-05-30 DIAGNOSIS — C50911 Malignant neoplasm of unspecified site of right female breast: Secondary | ICD-10-CM

## 2021-06-01 ENCOUNTER — Other Ambulatory Visit (INDEPENDENT_AMBULATORY_CARE_PROVIDER_SITE_OTHER): Payer: Self-pay | Admitting: Nurse Practitioner

## 2021-06-01 DIAGNOSIS — N183 Chronic kidney disease, stage 3 unspecified: Secondary | ICD-10-CM

## 2021-06-03 ENCOUNTER — Ambulatory Visit (INDEPENDENT_AMBULATORY_CARE_PROVIDER_SITE_OTHER): Payer: BC Managed Care – PPO

## 2021-06-03 ENCOUNTER — Encounter (INDEPENDENT_AMBULATORY_CARE_PROVIDER_SITE_OTHER): Payer: BC Managed Care – PPO | Admitting: Nurse Practitioner

## 2021-06-03 DIAGNOSIS — N183 Chronic kidney disease, stage 3 unspecified: Secondary | ICD-10-CM

## 2021-06-06 ENCOUNTER — Encounter: Payer: Self-pay | Admitting: Primary Care

## 2021-06-06 ENCOUNTER — Other Ambulatory Visit
Admission: RE | Admit: 2021-06-06 | Discharge: 2021-06-06 | Disposition: A | Discharge status: Home / Self Care | Payer: BC Managed Care – PPO | Attending: Primary Care | Admitting: Primary Care

## 2021-06-06 ENCOUNTER — Ambulatory Visit: Payer: BC Managed Care – PPO | Admitting: Primary Care

## 2021-06-06 VITALS — BP 136/78 | HR 80 | Temp 97.7°F | Ht 64.0 in | Wt 342.2 lb

## 2021-06-06 DIAGNOSIS — G4733 Obstructive sleep apnea (adult) (pediatric): Secondary | ICD-10-CM | POA: Diagnosis not present

## 2021-06-06 DIAGNOSIS — J9611 Chronic respiratory failure with hypoxia: Secondary | ICD-10-CM

## 2021-06-06 DIAGNOSIS — J984 Other disorders of lung: Secondary | ICD-10-CM

## 2021-06-06 DIAGNOSIS — R0602 Shortness of breath: Secondary | ICD-10-CM

## 2021-06-06 LAB — CBC
HCT: 43.8 % (ref 36.0–46.0)
Hemoglobin: 13.5 g/dL (ref 12.0–15.0)
MCH: 28.7 pg (ref 26.0–34.0)
MCHC: 30.8 g/dL (ref 30.0–36.0)
MCV: 93 fL (ref 80.0–100.0)
Platelets: 202 10*3/uL (ref 150–400)
RBC: 4.71 MIL/uL (ref 3.87–5.11)
RDW: 17.7 % — ABNORMAL HIGH (ref 11.5–15.5)
WBC: 10.7 10*3/uL — ABNORMAL HIGH (ref 4.0–10.5)
nRBC: 0 % (ref 0.0–0.2)

## 2021-06-06 LAB — BASIC METABOLIC PANEL
Anion gap: 14 (ref 5–15)
BUN: 63 mg/dL — ABNORMAL HIGH (ref 6–20)
CO2: 26 mmol/L (ref 22–32)
Calcium: 9.7 mg/dL (ref 8.9–10.3)
Chloride: 91 mmol/L — ABNORMAL LOW (ref 98–111)
Creatinine, Ser: 5.89 mg/dL — ABNORMAL HIGH (ref 0.44–1.00)
GFR, Estimated: 8 mL/min — ABNORMAL LOW (ref >60)
Glucose, Bld: 126 mg/dL — ABNORMAL HIGH (ref 70–99)
Potassium: 3.9 mmol/L (ref 3.5–5.1)
Sodium: 131 mmol/L — ABNORMAL LOW (ref 135–145)

## 2021-06-06 LAB — BRAIN NATRIURETIC PEPTIDE: B Natriuretic Peptide: 196.5 pg/mL — ABNORMAL HIGH (ref 0.0–100.0)

## 2021-06-06 NOTE — Patient Instructions (Addendum)
Recommendations: ?- Continue to wear Trilogy ventilator at bedtime and with naps ?- Continue Advair 1 puff morning and evening ?- Use Ventolin rescue inhaler 2 puffs every 4-6 hours as needed for breakthrough shortness of breath ? ?Orders: ?- Labs today ?- PFTs in 2-3 months prior to next visit   ? ?Follow-up: ?- 2-3 months with Dr. Halford Chessman (or his first available- 1 hour PFT prior) ?

## 2021-06-06 NOTE — Progress Notes (Signed)
BNP is significantly improved, trending down. CO2 is normal. Kidney function is bumped but she is due for dialysis tomorrow. Creatinine was 5.89. Sodium was 131, have her salt her food just a little today

## 2021-06-06 NOTE — Progress Notes (Signed)
? ?'@Patient'$  ID: Nicole Schmidt, female    DOB: 1966-01-30, 56 y.o.   MRN: [id number] ? ?Chief Complaint  ?Patient presents with  ? Follow-up  ?  Recently discharged- breathing has improved since. C/o sob with exertion.   ? ? ?Referring provider: ?Morayati, Lourdes Sledge, MD ? ?HPI: ?56 year old female.  Past medical history significant for OSA/OHS, chronic respiratory failure, hx PE.  Patient of Dr. Halford Chessman, last seen by pulmonary nurse practitioner on 01/13/2021. ? ?Recent hospital/ED course:  ?Patient was admitted on 03/28/2021 until 04/15/2021 for debility related to chronic respiratory failure superimposed with diastolic congestive heart failure.  He was previously admitted back in December for management of acute on chronic diastolic heart failure and end-stage renal disease on hemodialysis with permacath placed on 02/25/2021.  Patient presented to [facility] on [facility]/17/2021 with inability to ambulate and lower extremity swelling.  She became more somnolent unable to follow commands.  ABG showed pH 7.14/PCO2 89/bicarb 30.3 and was placed on BiPAP.  Chest x-ray showed bilateral pulmonary opacities that were nonspecific.  Follow-up chest x-ray on 03/22/2021 showed stable mild pulm edema.  Urine culture was positive for ESBL protease E. coli treated empirically with 1 dose fosfomycin.  Patient was admitted to rehab on 03/28/2021 for inpatient therapies.  She was reevaluated in the emergency room on 04/24/2021 for ACUTE hypoxia/NSTEMI.  Patient family member found her in her wheelchair sleep without trilogy ventilator.  She was confused upon waking.  Family called EMS.  EKG showed A-fib, no ST elevation or T wave inversions.  BG showed elevated CO2 but only slightly, pH was normal.  Patient did not require BiPAP.  Troponins uptrending.  Felt to be demand from acute respiratory failure.  Heparin drip was not started as patient was already on Eliquis and not actively having chest pain. Discharged.  ? ?06/06/2021 ?Patient presents today for  hospital follow-up. She is doing well. No acute respiratory symptoms. Her breathing is good. She is compliant with Advair twice daily. She has not needed to use her Albuterol rescue inhaler in several weeks. She came in with her oxygen but her tanks were empty. She is 95% on RA. Ambulates with walker. Using Trilogy every night and while napping. Finds it much easier to use than BIPAP. She sleeps in a recliner currently. There has only been one time where she took her Trilogy mask off that she is aware of and she was admitted to the ED for 26 hours for possible NSTEMI. She continues dialysis tues, thurs, sat. She thinks they took off 3.5L last dialysis round. She is following fluid restriction.  ? ?Allergies  ?Allergen Reactions  ? Spironolactone   ?  Hyperkalemia  ? ? ?Immunization History  ?Administered Date(s) Administered  ? Influenza-Unspecified 11/05/2018  ? PFIZER(Purple Top)SARS-COV-2 Vaccination 05/19/2019, 06/16/2019, 02/18/2020  ? Pension scheme manager 107yr & up 11/22/2020  ? Pneumococcal Polysaccharide-23 02/16/2020  ? ? ?Past Medical History:  ?Diagnosis Date  ? (HFpEF) heart failure with preserved ejection fraction ([place]   ? a. 10/2018 Echo: EF 60-65%, diast dysfxn, RVSP 50.7 mmHg; b. 01/2020 Echo: EF 65-70%, GrII DD; c. 01/2021 Echo: EF 60-65%, no rwma, mod LVH. Nl RV fxn, mildly enlarged RV. Mod BAE. Triv MR.  ? Acquired hypothyroidism 02/09/2020  ? Anemia   ? Arthritis   ? Breast cancer ([place] 2014  ? right breast cancer  ? Breast cancer of upper-inner quadrant of right female breast ([place]   ? Right breast invasive CA and DCIS ,  7 mm T1,N0,M0. Er/PR pos, her 2 negative.  Margins 1 mm.  ? Diabetes mellitus type 2 in obese ([place]) 02/09/2020  ? Dyslipidemia 02/09/2020  ? ESRD (end stage renal disease) ([place])   ? a. 02/2021 HD initiated.  ? Essential hypertension   ? Gout   ? Hypothyroidism   ? Menopause   ? age 54  ? Morbid obesity ([place])   ? Permanent atrial fibrillation ([place])   ? a. Dx  10/2018 in setting of PNA. CHA2DS2VASc = 4-->Eliquis; b. 11/2018 s/p successful DCCV; c. Subsequent recurrent Afib-->rate-control strategy.  ? Personal history of radiation therapy   ? Pulmonary embolism ([place]) 10/2014  ? a. Chronic eliquis.  ? Thyroid goiter   ? ? ?Tobacco History: ?Social History  ? ?Tobacco Use  ?Smoking Status Never  ? Passive exposure: Never  ?Smokeless Tobacco Never  ? ?Counseling given: Not Answered ? ? ?Outpatient Medications Prior to Visit  ?Medication Sig Dispense Refill  ? acetaminophen (TYLENOL) 325 MG tablet Take 2 tablets (650 mg total) by mouth every 6 (six) hours as needed for mild pain (or Fever >/= 101).    ? ADVAIR DISKUS 250-50 MCG/ACT AEPB 2 (two) times daily.    ? albuterol (VENTOLIN HFA) 108 (90 Base) MCG/ACT inhaler Inhale 2 puffs into the lungs every 6 (six) hours as needed for wheezing or shortness of breath. 8.5 g 0  ? amLODipine (NORVASC) 5 MG tablet Take 1 tablet (5 mg total) by mouth daily. 90 tablet 3  ? apixaban (ELIQUIS) 5 MG TABS tablet Take 1 tablet (5 mg total) by mouth 2 (two) times daily. 180 tablet 1  ? atorvastatin (LIPITOR) 20 MG tablet Take 1 tablet (20 mg total) by mouth at bedtime. 90 tablet 3  ? calcitRIOL (ROCALTROL) 0.25 MCG capsule Take 1 capsule (0.25 mcg total) by mouth daily. 30 capsule 0  ? calcium carbonate (OS-CAL - DOSED IN MG OF ELEMENTAL CALCIUM) 1250 (500 Ca) MG tablet Take 1 tablet (1,250 mg total) by mouth daily with breakfast.    ? cloNIDine (CATAPRES) 0.2 MG tablet Take 0.2 mg by mouth 2 (two) times daily.    ? diclofenac Sodium (VOLTAREN) 1 % GEL Apply 2 g topically 4 (four) times daily. 100 g 0  ? ergocalciferol (VITAMIN D2) 1.25 MG (50000 UT) capsule Take 1 capsule (50,000 Units total) by mouth once a week. 5 capsule 0  ? famotidine (PEPCID) 10 MG tablet Take 1 tablet (10 mg total) by mouth daily. 30 tablet 0  ? ferric gluconate 250 mg in sodium chloride 0.9 % 250 mL Inject 250 mg into the vein Every Tuesday,Thursday,and Saturday with  dialysis.    ? folic acid (FOLVITE) 1 MG tablet Take 1 tablet (1 mg total) by mouth daily. 30 tablet 0  ? gabapentin (NEURONTIN) 300 MG capsule Take 1 capsule (300 mg total) by mouth at bedtime. 30 capsule 0  ? levothyroxine (SYNTHROID) 112 MCG tablet Take 1 tablet (112 mcg total) by mouth daily before breakfast. 30 tablet 0  ? melatonin 5 MG TABS Take 2 tablets (10 mg total) by mouth at bedtime as needed (sleep). 60 tablet 0  ? metoprolol succinate (TOPROL-XL) 25 MG 24 hr tablet Take 1 tablet (25 mg total) by mouth daily. Take with or immediately following a meal. 90 tablet 3  ? midodrine (PROAMATINE) 10 MG tablet Take 1 tablet (10 mg total) by mouth Every Tuesday,Thursday,and Saturday with dialysis. 30 tablet 0  ? Multiple Vitamins-Minerals (CERTAVITE/ANTIOXIDANTS) TABS Take 1 tablet  by mouth at bedtime. 30 tablet 0  ? polyethylene glycol powder (GLYCOLAX/MIRALAX) 17 GM/SCOOP powder Take 1 capful (17 g) with water by mouth daily as needed for moderate constipation. 238 g 0  ? sevelamer carbonate (RENVELA) 800 MG tablet Take 1 tablet (800 mg total) by mouth 3 (three) times daily with meals. 90 tablet 0  ? traMADol (ULTRAM) 50 MG tablet Take 1 tablet (50 mg total) by mouth every 12 (twelve) hours as needed for moderate pain. 30 tablet 0  ? TRULICITY 1.5 ZO/1.0RU SOPN SMARTSIG:0.5 Milliliter(s) SUB-Q Once a Week    ? doxycycline (VIBRA-TABS) 100 MG tablet Take 100 mg by mouth 2 (two) times daily.    ? predniSONE (STERAPRED UNI-PAK 48 TAB) 10 MG (48) TBPK tablet See admin instructions. (Patient not taking: Reported on 04/27/2021)    ? ?No facility-administered medications prior to visit.  ? ? ? ? ?Review of Systems ? ?Review of Systems  ?Constitutional: Negative.   ?HENT: Negative.    ?Respiratory:  Positive for shortness of breath. Negative for cough, chest tightness and wheezing.   ?Cardiovascular: Negative.   ? ? ?Physical Exam ? ?BP 136/78 (BP Location: Left Arm, Cuff Size: Normal)   Pulse 80   Temp 97.7 ?F (36.5  ?C) (Temporal)   Ht '5\' 4"'$  (1.626 m)   Wt (!) 342 lb 3.2 oz (155.2 kg)   LMP 07/17/2012   SpO2 95%   BMI 58.74 kg/m?  ?Physical Exam ?Constitutional:   ?   General: She is not in acute distress. ?   Appearance:

## 2021-06-07 ENCOUNTER — Inpatient Hospital Stay: Payer: BC Managed Care – PPO | Admitting: Physical Medicine and Rehabilitation

## 2021-06-07 DIAGNOSIS — J984 Other disorders of lung: Secondary | ICD-10-CM | POA: Insufficient documentation

## 2021-06-07 NOTE — Assessment & Plan Note (Addendum)
-   Continue Advair 250-38mg 1 puff twice daily and prn albuterol hfa ?- Needs full PFTs at follow-up  ?- Encourage weight loss ?

## 2021-06-07 NOTE — Assessment & Plan Note (Addendum)
-   Stable; Patient is alert and oriented. CO2 on BMET was normal today  ?- Continue 2L with exertion and at night to maintain O2 >88-90%. Reinforced importance of compliance.  ?

## 2021-06-07 NOTE — Progress Notes (Signed)
Reviewed and agree with assessment/plan. ? ? ?Chesley Mires, MD ?[place] ?06/07/2021, 9:55 AM ?Pager:  [phone] ? ?

## 2021-06-07 NOTE — Assessment & Plan Note (Addendum)
-   Continue to wear Trilogy ventilator at bedtime and with naps ?- We will request download from [facility] for review ?

## 2021-06-08 ENCOUNTER — Telehealth: Payer: Self-pay | Admitting: Primary Care

## 2021-06-08 ENCOUNTER — Telehealth: Payer: Self-pay

## 2021-06-08 ENCOUNTER — Other Ambulatory Visit: Payer: Self-pay | Admitting: Urology

## 2021-06-08 ENCOUNTER — Encounter: Payer: Self-pay | Admitting: Urology

## 2021-06-08 ENCOUNTER — Ambulatory Visit: Payer: BC Managed Care – PPO | Admitting: Urology

## 2021-06-08 VITALS — BP 148/68 | HR 91 | Ht 64.0 in | Wt 342.0 lb

## 2021-06-08 DIAGNOSIS — N189 Chronic kidney disease, unspecified: Secondary | ICD-10-CM | POA: Diagnosis not present

## 2021-06-08 DIAGNOSIS — N200 Calculus of kidney: Secondary | ICD-10-CM | POA: Diagnosis not present

## 2021-06-08 DIAGNOSIS — N201 Calculus of ureter: Secondary | ICD-10-CM

## 2021-06-08 LAB — URINALYSIS, COMPLETE
Bilirubin, UA: NEGATIVE
Glucose, UA: NEGATIVE
Ketones, UA: NEGATIVE
Nitrite, UA: NEGATIVE
Specific Gravity, UA: 1.02 (ref 1.005–1.030)
Urobilinogen, Ur: 0.2 mg/dL (ref 0.2–1.0)
pH, UA: 5.5 (ref 5.0–7.5)

## 2021-06-08 LAB — MICROSCOPIC EXAMINATION: WBC, UA: >30 /hpf — AB (ref 0–5)

## 2021-06-08 NOTE — Telephone Encounter (Signed)
I spoke with Nicole Schmidt. We have discussed possible surgery dates and Friday May 5th, 2023 was agreed upon by all parties. Patient given information about surgery date, what to expect pre-operatively and post operatively.  ?We discussed that a Pre-Admission Testing office will be calling to set up the pre-op visit that will take place prior to surgery, and that these appointments are typically done over the phone with a Pre-Admissions RN. ? Informed patient that our office will communicate any additional care to be provided after surgery. Patients questions or concerns were discussed during our call. Advised to call our office should there be any additional information, questions or concerns that arise. Patient verbalized understanding.  ? ?

## 2021-06-08 NOTE — Addendum Note (Signed)
Addended by: Billey Co on: 06/08/2021 01:53 PM ? ? Modules accepted: Level of Service ? ?

## 2021-06-08 NOTE — Progress Notes (Signed)
? ?  06/08/2021 ?1:48 PM  ? ?Nicole Schmidt ?12-06-1965 ?[id number] ? ?Reason for visit: Follow up left proximal ureteral stone, renal failure ? ?HPI: ?56 year old extremely comorbid female with morbid obesity and BMI of 59, CHF, CKD with prior baseline creatinine of 1.8, diabetes, history of PE on chronic anticoagulation, and pulmonary disease on PRN oxygen.  She was admitted with possible pneumonia in late December 2022, and CT ultimately showed a 7 mm left proximal ureteral stone and in the setting of her worsening renal function with a creatinine of 5.5(eGFR 8) at that time she underwent urgent left ureteral stent placement.  Unfortunately her renal function did not significantly improve, and she has been on dialysis despite stenting.  She has had multiple admissions since that time for hypoxia and hypercapnic respiratory failure, also diagnosed with possible UTI in February, however this was more likely asymptomatic bacteriuria. ? ?She reports she has been making more urine lately, and she continues to follow closely with nephrology.  Follow-up renal ultrasound showed no hydronephrosis with stent in place. ? ?Clinically she looks significantly improved from our prior visit, and I think at this point we can schedule left ureteroscopy, laser lithotripsy, stent change. We specifically discussed the risks ureteroscopy including bleeding, infection/sepsis, stent related symptoms including flank pain/urgency/frequency/incontinence/dysuria, ureteral injury, inability to access stone, or need for staged or additional procedures. We discussed this is unlikely to change or improve her renal function, and she will likely remain dialysis dependent, however the stent cannot stay and long-term without causing UTIs or encrustation, and stone will need to be removed. ? ?Schedule left ureteroscopy, laser lithotripsy, stent placement ?Urine culture sent today ? ? ?Billey Co, MD ? ?[facility] ?[address] ?[place], [place] [address] ?[phone] ? ?

## 2021-06-08 NOTE — Progress Notes (Signed)
Surgical Physician Order Form [facility] ? ?* Scheduling expectation : Next Available ? ?*Length of Case: 1 hour ? ?*Clearance needed: no ? ?*Anticoagulation Instructions: May continue all anticoagulants ? ?*Aspirin Instructions: Ok to continue all ? ?*Post-op visit Date/Instructions:   TBD ? ?*Diagnosis: Left Ureteral Stone ? ?*Procedure: left  Ureteroscopy w/laser lithotripsy & stent exchange (59093) ? ? ?Additional orders: N/A ? ?-Admit type: OUTpatient ? ?-Anesthesia: General ? ?-VTE Prophylaxis Standing Order SCD?s    ?   ?Other:  ? ?-Standing Lab Orders Per Anesthesia   ? ?Lab other: UA&Urine Culture sent 4/26 ? ?-Standing Test orders EKG/Chest x-ray per Anesthesia      ? ?Test other:  ? ?- Medications: Cefepime 2g ? ?-Other orders:  N/A ? ? ? ?  ? ?

## 2021-06-08 NOTE — Telephone Encounter (Signed)
Trilogy download received and given to [facility] for review.  ? ?

## 2021-06-08 NOTE — Patient Instructions (Signed)
Laser Therapy for Kidney Stones ?Laser therapy for kidney stones is a procedure to break up small, hard mineral deposits that form in the kidney (kidney stones). The procedure is done using a device that produces a focused beam of light (laser). The laser breaks up kidney stones into pieces that are small enough to be passed out of the body through urination or removed from the body during the procedure. You may need laser therapy if you have kidney stones that are painful or block your urinary tract. ?This procedure is done by inserting a tube (ureteroscope) into your kidney through the urethral opening. The urethra is the part of the body that drains urine from the bladder. In women, the urethra opens above the vaginal opening. In men, the urethra opens at the tip of the penis. The ureteroscope is inserted through the urethra, and surgical instruments are moved through the bladder and the muscular tube that connects the kidney to the bladder (ureter) until they reach the kidney. ?Tell a health care provider about: ?Any allergies you have. ?All medicines you are taking, including vitamins, herbs, eye drops, creams, and over-the-counter medicines. ?Any problems you or family members have had with anesthetic medicines. ?Any blood disorders you have. ?Any surgeries you have had. ?Any medical conditions you have. ?Whether you are pregnant or may be pregnant. ?What are the risks? ?Generally, this is a safe procedure. However, problems may occur, including: ?Infection. ?Bleeding. ?Allergic reactions to medicines. ?Damage to the urethra, bladder, or ureter. ?Urinary tract infection (UTI). ?Narrowing of the urethra (urethral stricture). ?Difficulty passing urine. ?Blockage of the kidney caused by a fragment of kidney stone. ?What happens before the procedure? ?Medicines ?Ask your health care provider about: ?Changing or stopping your regular medicines. This is especially important if you are taking diabetes medicines or  blood thinners. ?Taking medicines such as aspirin and ibuprofen. These medicines can thin your blood. Do not take these medicines unless your health care provider tells you to take them. ?Taking over-the-counter medicines, vitamins, herbs, and supplements. ?Eating and drinking ?Follow instructions from your health care provider about eating and drinking, which may include: ?8 hours before the procedure - stop eating heavy meals or foods, such as meat, fried foods, or fatty foods. ?6 hours before the procedure - stop eating light meals or foods, such as toast or cereal. ?6 hours before the procedure - stop drinking milk or drinks that contain milk. ?2 hours before the procedure - stop drinking clear liquids. ?Staying hydrated ?Follow instructions from your health care provider about hydration, which may include: ?Up to 2 hours before the procedure - you may continue to drink clear liquids, such as water, clear fruit juice, black coffee, and plain tea. ? ?General instructions ?You may have a physical exam before the procedure. You may also have tests, such as imaging tests and blood or urine tests. ?If your ureter is too narrow, your health care provider may place a soft, flexible tube (stent) inside of it. The stent may be placed days or weeks before your laser therapy procedure. ?Plan to have someone take you home from the hospital or clinic. ?If you will be going home right after the procedure, plan to have someone stay with you for 24 hours. ?Do not use any products that contain nicotine or tobacco for at least 4 weeks before the procedure. These products include cigarettes, e-cigarettes, and chewing tobacco. If you need help quitting, ask your health care provider. ?Ask your health care provider: ?How your  surgical site will be marked or identified. What steps will be taken to help prevent infection. These may include: Removing hair at the surgery site. Washing skin with a germ-killing soap. Taking antibiotic  medicine. What happens during the procedure?  An IV will be inserted into one of your veins. You will be given one or more of the following: A medicine to help you relax (sedative). A medicine to numb the area (local anesthetic). A medicine to make you fall asleep (general anesthetic). A ureteroscope will be inserted into your urethra. The ureteroscope will send images to a video screen in the operating room to guide your surgeon to the area of your kidney that will be treated. A small, flexible tube will be threaded through the ureteroscope and into your bladder and ureter, up to your kidney. The laser device will be inserted into your kidney through the tube. Your surgeon will pulse the laser on and off to break up kidney stones. A surgical instrument that has a tiny wire basket may be inserted through the tube into your kidney to remove the pieces of broken kidney stone. The procedure may vary among health care providers and hospitals. What happens after the procedure? Your blood pressure, heart rate, breathing rate, and blood oxygen level will be monitored until you leave the hospital or clinic. You will be given pain medicine as needed. You may continue to receive antibiotics. You may have a stent temporarily placed in your ureter. Do not drive for 24 hours if you were given a sedative during your procedure. You may be given a strainer to collect any stone fragments that you pass in your urine. Your health care provider may have these tested. Summary Laser therapy for kidney stones is a procedure to break up kidney stones into pieces that are small enough to be passed out of the body through urination or removed during the procedure. Follow instructions from your health care provider about eating and drinking before the procedure. During the procedure, the ureteroscope will send images to a video screen to guide your surgeon to the area of your kidney that will be treated. Do not drive  for 24 hours if you were given a sedative during your procedure. This information is not intended to replace advice given to you by your health care provider. Make sure you discuss any questions you have with your health care provider. Document Revised: 10/04/2020 Document Reviewed: 10/04/2020 Elsevier Patient Education  2023 Elsevier Inc.  

## 2021-06-08 NOTE — Telephone Encounter (Signed)
Trilogy download 05/10/21- 06/08/21 ?Usage 29/30 days (97%); 29 days (97%) > 4 hours ?Average usage 8 hours 9 mins ?Inspiratory min PS 6/max PS 22 ?Expiratory min EPAP 4cm h20/ max Epap 15cm h20 ?Airleaks median 4.1L/min (35.8L/min 95%) ?Events AHI 0.6 ?

## 2021-06-09 NOTE — Progress Notes (Signed)
[facility] Posting Form  ? ?Surgery Date/Time: Date: 06/17/2021 ? ?Surgeon: Dr. Nickolas Madrid, Md ? ?Surgery Location: Day Surgery ? ?Inpt ( No  )   Outpt (Yes)   Obs ( No  )  ? ?Diagnosis: N20.1 Left Ureteral Stone ? ?CPT: 94473 ? ?Surgery: Left Ureteroscopy with laser lithotripsy and stent exchange ? ?Stop Anticoagulations: No, may continue ASA ? ?Cardiac/Medical/Pulmonary Clearance needed: no ? ?*Orders entered into EPIC  Date: 06/09/21  ? ?*Case booked in EPIC  Date:06/08/2021 ? ?*Notified pt of Surgery: Date: 06/08/2021 ? ?PRE-OP UA & CX: yes, obtained in clinic 06/08/2021 ? ?*Placed into Prior Authorization Work Fabio Bering Date: 06/09/21 ? ? ?Assistant/laser/rep:No ? ? ? ? ? ? ? ? ? ? ? ? ? ? ? ?

## 2021-06-10 DIAGNOSIS — I5030 Unspecified diastolic (congestive) heart failure: Secondary | ICD-10-CM | POA: Diagnosis not present

## 2021-06-10 DIAGNOSIS — D631 Anemia in chronic kidney disease: Secondary | ICD-10-CM | POA: Diagnosis not present

## 2021-06-10 DIAGNOSIS — I214 Non-ST elevation (NSTEMI) myocardial infarction: Secondary | ICD-10-CM | POA: Diagnosis not present

## 2021-06-10 DIAGNOSIS — I132 Hypertensive heart and chronic kidney disease with heart failure and with stage 5 chronic kidney disease, or end stage renal disease: Secondary | ICD-10-CM | POA: Diagnosis not present

## 2021-06-10 DIAGNOSIS — N186 End stage renal disease: Secondary | ICD-10-CM | POA: Diagnosis not present

## 2021-06-10 DIAGNOSIS — E039 Hypothyroidism, unspecified: Secondary | ICD-10-CM | POA: Diagnosis not present

## 2021-06-10 DIAGNOSIS — E785 Hyperlipidemia, unspecified: Secondary | ICD-10-CM | POA: Diagnosis not present

## 2021-06-10 DIAGNOSIS — J9622 Acute and chronic respiratory failure with hypercapnia: Secondary | ICD-10-CM | POA: Diagnosis not present

## 2021-06-10 DIAGNOSIS — I480 Paroxysmal atrial fibrillation: Secondary | ICD-10-CM | POA: Diagnosis not present

## 2021-06-10 DIAGNOSIS — D509 Iron deficiency anemia, unspecified: Secondary | ICD-10-CM | POA: Diagnosis not present

## 2021-06-10 DIAGNOSIS — G4733 Obstructive sleep apnea (adult) (pediatric): Secondary | ICD-10-CM | POA: Diagnosis not present

## 2021-06-10 DIAGNOSIS — E1122 Type 2 diabetes mellitus with diabetic chronic kidney disease: Secondary | ICD-10-CM | POA: Diagnosis not present

## 2021-06-10 DIAGNOSIS — J9611 Chronic respiratory failure with hypoxia: Secondary | ICD-10-CM | POA: Diagnosis not present

## 2021-06-12 DIAGNOSIS — N186 End stage renal disease: Secondary | ICD-10-CM | POA: Diagnosis not present

## 2021-06-12 DIAGNOSIS — Z992 Dependence on renal dialysis: Secondary | ICD-10-CM | POA: Diagnosis not present

## 2021-06-12 LAB — CULTURE, URINE COMPREHENSIVE

## 2021-06-13 ENCOUNTER — Telehealth: Payer: Self-pay

## 2021-06-13 ENCOUNTER — Encounter
Admission: RE | Admit: 2021-06-13 | Discharge: 2021-06-13 | Disposition: A | Discharge status: Home / Self Care | Payer: BC Managed Care – PPO | Source: Ambulatory Visit | Attending: Urology | Admitting: Urology

## 2021-06-13 ENCOUNTER — Other Ambulatory Visit: Payer: Self-pay

## 2021-06-13 VITALS — Ht 64.0 in | Wt 330.0 lb

## 2021-06-13 DIAGNOSIS — D631 Anemia in chronic kidney disease: Secondary | ICD-10-CM | POA: Diagnosis not present

## 2021-06-13 DIAGNOSIS — D509 Iron deficiency anemia, unspecified: Secondary | ICD-10-CM | POA: Diagnosis not present

## 2021-06-13 DIAGNOSIS — E785 Hyperlipidemia, unspecified: Secondary | ICD-10-CM | POA: Diagnosis not present

## 2021-06-13 DIAGNOSIS — I132 Hypertensive heart and chronic kidney disease with heart failure and with stage 5 chronic kidney disease, or end stage renal disease: Secondary | ICD-10-CM | POA: Diagnosis not present

## 2021-06-13 DIAGNOSIS — E1122 Type 2 diabetes mellitus with diabetic chronic kidney disease: Secondary | ICD-10-CM | POA: Diagnosis not present

## 2021-06-13 DIAGNOSIS — J9611 Chronic respiratory failure with hypoxia: Secondary | ICD-10-CM | POA: Diagnosis not present

## 2021-06-13 DIAGNOSIS — N186 End stage renal disease: Secondary | ICD-10-CM

## 2021-06-13 DIAGNOSIS — G4733 Obstructive sleep apnea (adult) (pediatric): Secondary | ICD-10-CM | POA: Diagnosis not present

## 2021-06-13 DIAGNOSIS — I480 Paroxysmal atrial fibrillation: Secondary | ICD-10-CM | POA: Diagnosis not present

## 2021-06-13 DIAGNOSIS — I5030 Unspecified diastolic (congestive) heart failure: Secondary | ICD-10-CM | POA: Diagnosis not present

## 2021-06-13 DIAGNOSIS — E039 Hypothyroidism, unspecified: Secondary | ICD-10-CM | POA: Diagnosis not present

## 2021-06-13 DIAGNOSIS — I214 Non-ST elevation (NSTEMI) myocardial infarction: Secondary | ICD-10-CM | POA: Diagnosis not present

## 2021-06-13 DIAGNOSIS — J9622 Acute and chronic respiratory failure with hypercapnia: Secondary | ICD-10-CM | POA: Diagnosis not present

## 2021-06-13 MED ORDER — AMOXICILLIN-POT CLAVULANATE 875-125 MG PO TABS: 1.0000 | ORAL_TABLET | Freq: Two times a day (BID) | ORAL | 0 refills | Status: AC

## 2021-06-13 NOTE — Telephone Encounter (Signed)
Patient called in this morning, concerned about Urine Culture showing bacteria and would like to know if she should start antibiotics prior to surgery on Friday.  ?

## 2021-06-13 NOTE — Patient Instructions (Addendum)
Your procedure is scheduled on: 06/17/2021  ?Report to the Registration Desk on the 1st floor of the [facility]. ?To find out your arrival time, call [facility]  at [phone] between [place] on: 06/16/2021  ? ?REMEMBER: ?Instructions that are not followed completely may result in serious medical risk, up to and including death; or upon the discretion of your surgeon and anesthesiologist your surgery may need to be rescheduled. ? ?Do not eat food after midnight the night before surgery.  ?No gum chewing, lozengers or hard candies to minimize gastric acid production.  ? ? ? ? ?Continue taking medications until the  day prior to surgery unless you are instructed to stop them and THESE are the only MEDICATIONS  you need take THE MORNING OF SURGERY WITH A SIP OF WATER:   ?Amlodipine ?Calcitrol ?3.  Levothyroxine ?5. metoprolol succinate  ?6. Clonidine ?7. Tramadol  ? ? ?Use advair inhaler  on the day of surgery and bring albuterol  to the hospital. ? ?**Follow new guidelines for insulin and diabetes medications.** ? ?Follow recommendations from Cardiologist, Pulmonologist or PCP regarding stopping  Eliquis. You were instructed to call the surgeon's office to  get advise regarding Eliquis. Please take a note of the date.  ? ?One week prior to surgery: ?Stop Anti-inflammatories (NSAIDS) such as Advil, Aleve, Ibuprofen, Motrin, Naproxen, Naprosyn and Aspirin based products such as Excedrin, Goodys Powder, BC Powder. ?Stop ANY OVER THE COUNTER supplements until after surgery like, Vit D,folic acid, melatonin, and multivitamins and calcium carbonate.  ?You may however, continue to take Tylenol if needed for pain up until the day of surgery. ? ?No Alcohol for 24 hours before or after surgery. ? ?No Smoking including e-cigarettes for 24 hours prior to surgery.  ?No chewable tobacco products for at least 6 hours prior to surgery.  ?No nicotine patches on the day of surgery. ? ?Do not use any "recreational" drugs  for at least a week prior to your surgery.  ?Please be advised that the combination of cocaine and anesthesia may have negative outcomes, up to and including death. ?If you test positive for cocaine, your surgery will be cancelled. ? ?On the morning of surgery brush your teeth with toothpaste and water, you may rinse your mouth with mouthwash if you wish. ?Do not swallow any toothpaste or mouthwash. ? ?Use CHG Soap as directed  with shower the morning of surgery-provided for you ? ? ?Do not wear jewelry, make-up, hairpins, clips or nail polish. Please remove all body jewelry.  ? ?Do not wear lotions, powders, perfumes, ointments and creams. You can use deodorant.  ? ?Do not shave body from the neck down 48 hours prior to surgery just in case you cut yourself which could leave a site for infection.  ?Also, freshly shaved skin may become irritated if using the CHG soap. ? ?Contact lenses, hearing aids and dentures may not be worn into surgery. ? ?Do not bring valuables to the hospital. [facility] is not responsible for any missing/lost belongings or valuables.  ? ? ?Notify your doctor if there is any change in your medical condition (cold, fever, infection). ? ?Wear comfortable clothing (specific to your surgery type) to the hospital. ? ?After surgery, you can help prevent lung complications by doing breathing exercises.  ?Take deep breaths and cough every 1-2 hours. Your doctor may order a device called an Incentive Spirometer to help you take deep breaths. ? ?If you are being admitted to  the hospital overnight, leave your suitcase in the car. ?After surgery it may be brought to your room. ? ?If you are being discharged the day of surgery, you will not be allowed to drive home. ?You will need a responsible adult (18 years or older) to drive you home and stay with you that night.  ? ?If you are taking public transportation, you will need to have a responsible adult (18 years or older) with you. ?Please confirm with  your physician that it is acceptable to use public transportation.  ? ?Please call the [facility] Dept. at [phone] if you have any questions about these instructions. ? ?Surgery Visitation Policy: ? ?Patients undergoing a surgery or procedure may have 2 family member or support person with them as long as that person is not COVID-19 positive or experiencing its symptoms.  ?Face mask is not required at the hospital. It is you discretion if you would like to wear mask . You can also request for the staff to wear mask if would benefit you. ? ? ?

## 2021-06-13 NOTE — Telephone Encounter (Signed)
-----   Message from Billey Co, MD sent at 06/13/2021  9:28 AM EDT ----- ?Lets do augmentin 875-125 BID x 5 days to sterilize urine prior to upcoming surgery ? ?Thanks ?Nickolas Madrid, MD ?06/13/2021 ? ? ?

## 2021-06-13 NOTE — Telephone Encounter (Signed)
Spoke with pt. Pt. Advised to start medication. Medication sent in to pharmacy of patient's choice, pt. Aware.  ?

## 2021-06-14 ENCOUNTER — Encounter (INDEPENDENT_AMBULATORY_CARE_PROVIDER_SITE_OTHER): Payer: BC Managed Care – PPO | Admitting: Nurse Practitioner

## 2021-06-15 ENCOUNTER — Encounter: Payer: Self-pay | Admitting: Urgent Care

## 2021-06-15 ENCOUNTER — Encounter
Admission: RE | Admit: 2021-06-15 | Discharge: 2021-06-15 | Disposition: A | Discharge status: Home / Self Care | Payer: BC Managed Care – PPO | Source: Ambulatory Visit | Attending: Urology | Admitting: Urology

## 2021-06-15 DIAGNOSIS — D759 Disease of blood and blood-forming organs, unspecified: Secondary | ICD-10-CM | POA: Diagnosis not present

## 2021-06-15 DIAGNOSIS — J449 Chronic obstructive pulmonary disease, unspecified: Secondary | ICD-10-CM | POA: Diagnosis not present

## 2021-06-15 DIAGNOSIS — Z01810 Encounter for preprocedural cardiovascular examination: Secondary | ICD-10-CM | POA: Diagnosis not present

## 2021-06-15 DIAGNOSIS — N186 End stage renal disease: Secondary | ICD-10-CM | POA: Insufficient documentation

## 2021-06-15 DIAGNOSIS — E1122 Type 2 diabetes mellitus with diabetic chronic kidney disease: Secondary | ICD-10-CM | POA: Diagnosis not present

## 2021-06-15 DIAGNOSIS — Z7901 Long term (current) use of anticoagulants: Secondary | ICD-10-CM | POA: Diagnosis not present

## 2021-06-15 DIAGNOSIS — I4821 Permanent atrial fibrillation: Secondary | ICD-10-CM | POA: Diagnosis not present

## 2021-06-15 DIAGNOSIS — D649 Anemia, unspecified: Secondary | ICD-10-CM | POA: Diagnosis not present

## 2021-06-15 DIAGNOSIS — I5032 Chronic diastolic (congestive) heart failure: Secondary | ICD-10-CM | POA: Diagnosis not present

## 2021-06-15 DIAGNOSIS — N201 Calculus of ureter: Secondary | ICD-10-CM | POA: Diagnosis not present

## 2021-06-15 DIAGNOSIS — N329 Bladder disorder, unspecified: Secondary | ICD-10-CM | POA: Diagnosis not present

## 2021-06-15 DIAGNOSIS — Z992 Dependence on renal dialysis: Secondary | ICD-10-CM | POA: Insufficient documentation

## 2021-06-15 DIAGNOSIS — I132 Hypertensive heart and chronic kidney disease with heart failure and with stage 5 chronic kidney disease, or end stage renal disease: Secondary | ICD-10-CM | POA: Diagnosis not present

## 2021-06-15 DIAGNOSIS — Z853 Personal history of malignant neoplasm of breast: Secondary | ICD-10-CM | POA: Diagnosis not present

## 2021-06-15 DIAGNOSIS — Z9981 Dependence on supplemental oxygen: Secondary | ICD-10-CM | POA: Diagnosis not present

## 2021-06-15 DIAGNOSIS — Z86711 Personal history of pulmonary embolism: Secondary | ICD-10-CM | POA: Diagnosis not present

## 2021-06-15 DIAGNOSIS — Z01818 Encounter for other preprocedural examination: Secondary | ICD-10-CM | POA: Insufficient documentation

## 2021-06-15 DIAGNOSIS — G4730 Sleep apnea, unspecified: Secondary | ICD-10-CM | POA: Diagnosis not present

## 2021-06-15 DIAGNOSIS — Z6841 Body Mass Index (BMI) 40.0 and over, adult: Secondary | ICD-10-CM | POA: Diagnosis not present

## 2021-06-15 LAB — CBC
HCT: 41 % (ref 36.0–46.0)
Hemoglobin: 12.5 g/dL (ref 12.0–15.0)
MCH: 29.1 pg (ref 26.0–34.0)
MCHC: 30.5 g/dL (ref 30.0–36.0)
MCV: 95.3 fL (ref 80.0–100.0)
Platelets: 192 10*3/uL (ref 150–400)
RBC: 4.3 MIL/uL (ref 3.87–5.11)
RDW: 17.5 % — ABNORMAL HIGH (ref 11.5–15.5)
WBC: 8.8 10*3/uL (ref 4.0–10.5)
nRBC: 0 % (ref 0.0–0.2)

## 2021-06-15 LAB — COMPREHENSIVE METABOLIC PANEL
ALT: 20 U/L (ref 0–44)
AST: 18 U/L (ref 15–41)
Albumin: 3.3 g/dL — ABNORMAL LOW (ref 3.5–5.0)
Alkaline Phosphatase: 140 U/L — ABNORMAL HIGH (ref 38–126)
Anion gap: 13 (ref 5–15)
BUN: 51 mg/dL — ABNORMAL HIGH (ref 6–20)
CO2: 27 mmol/L (ref 22–32)
Calcium: 9.1 mg/dL (ref 8.9–10.3)
Chloride: 93 mmol/L — ABNORMAL LOW (ref 98–111)
Creatinine, Ser: 5.52 mg/dL — ABNORMAL HIGH (ref 0.44–1.00)
GFR, Estimated: 8 mL/min — ABNORMAL LOW (ref >60)
Glucose, Bld: 136 mg/dL — ABNORMAL HIGH (ref 70–99)
Potassium: 4.3 mmol/L (ref 3.5–5.1)
Sodium: 133 mmol/L — ABNORMAL LOW (ref 135–145)
Total Bilirubin: 0.6 mg/dL (ref 0.3–1.2)
Total Protein: 7.1 g/dL (ref 6.5–8.1)

## 2021-06-17 ENCOUNTER — Encounter (INDEPENDENT_AMBULATORY_CARE_PROVIDER_SITE_OTHER): Payer: Self-pay | Admitting: Nurse Practitioner

## 2021-06-17 ENCOUNTER — Encounter: Payer: Self-pay | Admitting: Urology

## 2021-06-17 ENCOUNTER — Other Ambulatory Visit: Payer: Self-pay

## 2021-06-17 ENCOUNTER — Ambulatory Visit: Payer: BC Managed Care – PPO | Admitting: Anesthesiology

## 2021-06-17 ENCOUNTER — Ambulatory Visit: Payer: BC Managed Care – PPO

## 2021-06-17 ENCOUNTER — Ambulatory Visit
Admission: RE | Admit: 2021-06-17 | Discharge: 2021-06-17 | Disposition: A | Discharge status: Home / Self Care | Payer: BC Managed Care – PPO | Source: Ambulatory Visit | Attending: Urology | Admitting: Urology

## 2021-06-17 ENCOUNTER — Encounter
Admission: RE | Disposition: A | Discharge status: Home / Self Care | Payer: Self-pay | Source: Ambulatory Visit | Attending: Urology

## 2021-06-17 ENCOUNTER — Ambulatory Visit (INDEPENDENT_AMBULATORY_CARE_PROVIDER_SITE_OTHER): Payer: BC Managed Care – PPO | Admitting: Nurse Practitioner

## 2021-06-17 VITALS — BP 112/78 | HR 109 | Resp 18 | Ht 64.0 in | Wt 337.0 lb

## 2021-06-17 DIAGNOSIS — I5032 Chronic diastolic (congestive) heart failure: Secondary | ICD-10-CM | POA: Diagnosis not present

## 2021-06-17 DIAGNOSIS — E1122 Type 2 diabetes mellitus with diabetic chronic kidney disease: Secondary | ICD-10-CM | POA: Diagnosis not present

## 2021-06-17 DIAGNOSIS — E039 Hypothyroidism, unspecified: Secondary | ICD-10-CM | POA: Diagnosis not present

## 2021-06-17 DIAGNOSIS — D649 Anemia, unspecified: Secondary | ICD-10-CM | POA: Diagnosis not present

## 2021-06-17 DIAGNOSIS — Z86711 Personal history of pulmonary embolism: Secondary | ICD-10-CM | POA: Diagnosis not present

## 2021-06-17 DIAGNOSIS — I132 Hypertensive heart and chronic kidney disease with heart failure and with stage 5 chronic kidney disease, or end stage renal disease: Secondary | ICD-10-CM | POA: Insufficient documentation

## 2021-06-17 DIAGNOSIS — E1165 Type 2 diabetes mellitus with hyperglycemia: Secondary | ICD-10-CM

## 2021-06-17 DIAGNOSIS — N186 End stage renal disease: Secondary | ICD-10-CM

## 2021-06-17 DIAGNOSIS — N201 Calculus of ureter: Secondary | ICD-10-CM | POA: Insufficient documentation

## 2021-06-17 DIAGNOSIS — N329 Bladder disorder, unspecified: Secondary | ICD-10-CM | POA: Insufficient documentation

## 2021-06-17 DIAGNOSIS — Z853 Personal history of malignant neoplasm of breast: Secondary | ICD-10-CM | POA: Insufficient documentation

## 2021-06-17 DIAGNOSIS — Z992 Dependence on renal dialysis: Secondary | ICD-10-CM | POA: Insufficient documentation

## 2021-06-17 DIAGNOSIS — Z6841 Body Mass Index (BMI) 40.0 and over, adult: Secondary | ICD-10-CM | POA: Diagnosis not present

## 2021-06-17 DIAGNOSIS — J449 Chronic obstructive pulmonary disease, unspecified: Secondary | ICD-10-CM | POA: Insufficient documentation

## 2021-06-17 DIAGNOSIS — I4821 Permanent atrial fibrillation: Secondary | ICD-10-CM | POA: Diagnosis not present

## 2021-06-17 DIAGNOSIS — E785 Hyperlipidemia, unspecified: Secondary | ICD-10-CM

## 2021-06-17 DIAGNOSIS — Z7901 Long term (current) use of anticoagulants: Secondary | ICD-10-CM | POA: Insufficient documentation

## 2021-06-17 DIAGNOSIS — G4730 Sleep apnea, unspecified: Secondary | ICD-10-CM | POA: Insufficient documentation

## 2021-06-17 DIAGNOSIS — D759 Disease of blood and blood-forming organs, unspecified: Secondary | ICD-10-CM | POA: Diagnosis not present

## 2021-06-17 DIAGNOSIS — Z9981 Dependence on supplemental oxygen: Secondary | ICD-10-CM | POA: Insufficient documentation

## 2021-06-17 HISTORY — PX: CYSTOSCOPY W/ RETROGRADES: SHX1426

## 2021-06-17 HISTORY — PX: CYSTOSCOPY/URETEROSCOPY/HOLMIUM LASER/STENT PLACEMENT: SHX6546

## 2021-06-17 LAB — POCT I-STAT, CHEM 8
BUN: 42 mg/dL — ABNORMAL HIGH (ref 6–20)
Calcium, Ion: 1.13 mmol/L — ABNORMAL LOW (ref 1.15–1.40)
Chloride: 94 mmol/L — ABNORMAL LOW (ref 98–111)
Creatinine, Ser: 6.9 mg/dL — ABNORMAL HIGH (ref 0.44–1.00)
Glucose, Bld: 127 mg/dL — ABNORMAL HIGH (ref 70–99)
HCT: 42 % (ref 36.0–46.0)
Hemoglobin: 14.3 g/dL (ref 12.0–15.0)
Potassium: 4.9 mmol/L (ref 3.5–5.1)
Sodium: 131 mmol/L — ABNORMAL LOW (ref 135–145)
TCO2: 25 mmol/L (ref 22–32)

## 2021-06-17 LAB — GLUCOSE, CAPILLARY: Glucose-Capillary: 117 mg/dL — ABNORMAL HIGH (ref 70–99)

## 2021-06-17 SURGERY — CYSTOSCOPY/URETEROSCOPY/HOLMIUM LASER/STENT PLACEMENT
Anesthesia: General | Laterality: Left

## 2021-06-17 MED ORDER — CHLORHEXIDINE GLUCONATE 0.12 % MT SOLN
OROMUCOSAL | Status: AC
  Administered 2021-06-17: 15 mL via OROMUCOSAL
  Filled 2021-06-17: qty 15

## 2021-06-17 MED ORDER — SODIUM CHLORIDE 0.9 % IV SOLN
2.0000 g | Freq: Once | INTRAVENOUS | Status: AC
  Administered 2021-06-17: 2 g via INTRAVENOUS
  Filled 2021-06-17: qty 2

## 2021-06-17 MED ORDER — PROPOFOL 10 MG/ML IV BOLUS
INTRAVENOUS | Status: AC
  Filled 2021-06-17: qty 20

## 2021-06-17 MED ORDER — ACETAMINOPHEN 10 MG/ML IV SOLN: 1000.0000 mg | Freq: Once | INTRAVENOUS | Status: DC | PRN

## 2021-06-17 MED ORDER — OXYCODONE HCL 5 MG PO TABS: 5.0000 mg | ORAL_TABLET | Freq: Once | ORAL | Status: DC | PRN

## 2021-06-17 MED ORDER — OXYCODONE HCL 5 MG/5ML PO SOLN: 5.0000 mg | Freq: Once | ORAL | Status: DC | PRN

## 2021-06-17 MED ORDER — LACTATED RINGERS IV SOLN: INTRAVENOUS | Status: DC

## 2021-06-17 MED ORDER — CHLORHEXIDINE GLUCONATE 0.12 % MT SOLN: 15.0000 mL | Freq: Once | OROMUCOSAL | Status: AC

## 2021-06-17 MED ORDER — FENTANYL CITRATE (PF) 100 MCG/2ML IJ SOLN: 25.0000 ug | INTRAMUSCULAR | Status: DC | PRN

## 2021-06-17 MED ORDER — LIDOCAINE HCL (CARDIAC) PF 100 MG/5ML IV SOSY
PREFILLED_SYRINGE | INTRAVENOUS | Status: DC | PRN
Start: 2021-06-17 — End: 2021-06-17
  Administered 2021-06-17: 100 mg via INTRAVENOUS

## 2021-06-17 MED ORDER — PHENYLEPHRINE 80 MCG/ML (10ML) SYRINGE FOR IV PUSH (FOR BLOOD PRESSURE SUPPORT)
PREFILLED_SYRINGE | INTRAVENOUS | Status: AC
  Filled 2021-06-17: qty 10

## 2021-06-17 MED ORDER — SUGAMMADEX SODIUM 500 MG/5ML IV SOLN
INTRAVENOUS | Status: DC | PRN
Start: 2021-06-17 — End: 2021-06-17
  Administered 2021-06-17: 400 mg via INTRAVENOUS

## 2021-06-17 MED ORDER — ONDANSETRON HCL 4 MG/2ML IJ SOLN
INTRAMUSCULAR | Status: DC | PRN
  Administered 2021-06-17: 4 mg via INTRAVENOUS

## 2021-06-17 MED ORDER — SODIUM CHLORIDE 0.9 % IV SOLN
INTRAVENOUS | Status: DC
Start: 2021-06-17 — End: 2021-06-17

## 2021-06-17 MED ORDER — SUCCINYLCHOLINE CHLORIDE 200 MG/10ML IV SOSY
PREFILLED_SYRINGE | INTRAVENOUS | Status: DC | PRN
  Administered 2021-06-17: 120 mg via INTRAVENOUS

## 2021-06-17 MED ORDER — MIDAZOLAM HCL 2 MG/2ML IJ SOLN
INTRAMUSCULAR | Status: AC
  Filled 2021-06-17: qty 2

## 2021-06-17 MED ORDER — PHENYLEPHRINE 80 MCG/ML (10ML) SYRINGE FOR IV PUSH (FOR BLOOD PRESSURE SUPPORT)
PREFILLED_SYRINGE | INTRAVENOUS | Status: DC | PRN
Start: 2021-06-17 — End: 2021-06-17
  Administered 2021-06-17: 160 ug via INTRAVENOUS

## 2021-06-17 MED ORDER — FENTANYL CITRATE (PF) 100 MCG/2ML IJ SOLN
INTRAMUSCULAR | Status: DC | PRN
  Administered 2021-06-17 (×2): 50 ug via INTRAVENOUS

## 2021-06-17 MED ORDER — ROCURONIUM BROMIDE 100 MG/10ML IV SOLN
INTRAVENOUS | Status: DC | PRN
Start: 2021-06-17 — End: 2021-06-17
  Administered 2021-06-17: 40 mg via INTRAVENOUS
  Administered 2021-06-17: 10 mg via INTRAVENOUS

## 2021-06-17 MED ORDER — ONDANSETRON HCL 4 MG/2ML IJ SOLN
INTRAMUSCULAR | Status: AC
  Filled 2021-06-17: qty 2

## 2021-06-17 MED ORDER — ORAL CARE MOUTH RINSE: 15.0000 mL | Freq: Once | OROMUCOSAL | Status: AC

## 2021-06-17 MED ORDER — PROPOFOL 10 MG/ML IV BOLUS
INTRAVENOUS | Status: DC | PRN
  Administered 2021-06-17: 100 mg via INTRAVENOUS
  Administered 2021-06-17: 150 mg via INTRAVENOUS

## 2021-06-17 MED ORDER — DEXAMETHASONE SODIUM PHOSPHATE 10 MG/ML IJ SOLN
INTRAMUSCULAR | Status: DC | PRN
Start: 2021-06-17 — End: 2021-06-17
  Administered 2021-06-17: 10 mg via INTRAVENOUS

## 2021-06-17 MED ORDER — MIDAZOLAM HCL 2 MG/2ML IJ SOLN
INTRAMUSCULAR | Status: DC | PRN
  Administered 2021-06-17: 2 mg via INTRAVENOUS

## 2021-06-17 MED ORDER — FENTANYL CITRATE (PF) 100 MCG/2ML IJ SOLN
INTRAMUSCULAR | Status: AC
  Filled 2021-06-17: qty 2

## 2021-06-17 MED ORDER — ONDANSETRON HCL 4 MG/2ML IJ SOLN: 4.0000 mg | Freq: Once | INTRAMUSCULAR | Status: DC | PRN

## 2021-06-17 SURGICAL SUPPLY — 29 items
ADHESIVE MASTISOL STRL (MISCELLANEOUS) IMPLANT
BAG DRAIN CYSTO-URO LG1000N (MISCELLANEOUS) ×3 IMPLANT
BRUSH SCRUB EZ 1% IODOPHOR (MISCELLANEOUS) ×3 IMPLANT
CATH URET FLEX-TIP 2 LUMEN 10F (CATHETERS) IMPLANT
CATH URETL OPEN 5X70 (CATHETERS) IMPLANT
CNTNR SPEC 2.5X3XGRAD LEK (MISCELLANEOUS)
CONT SPEC 4OZ STER OR WHT (MISCELLANEOUS)
CONTAINER SPEC 2.5X3XGRAD LEK (MISCELLANEOUS) IMPLANT
DRAPE UTILITY 15X26 TOWEL STRL (DRAPES) ×3 IMPLANT
DRSG TEGADERM 2-3/8X2-3/4 SM (GAUZE/BANDAGES/DRESSINGS) IMPLANT
GLOVE SURG UNDER POLY LF SZ7.5 (GLOVE) ×3 IMPLANT
GOWN STRL REUS W/ TWL LRG LVL3 (GOWN DISPOSABLE) ×2 IMPLANT
GOWN STRL REUS W/ TWL XL LVL3 (GOWN DISPOSABLE) ×2 IMPLANT
GOWN STRL REUS W/TWL LRG LVL3 (GOWN DISPOSABLE) ×1
GOWN STRL REUS W/TWL XL LVL3 (GOWN DISPOSABLE) ×1
GUIDEWIRE STR DUAL SENSOR (WIRE) ×3 IMPLANT
INFUSOR MANOMETER BAG 3000ML (MISCELLANEOUS) ×2 IMPLANT
IV NS IRRIG 3000ML ARTHROMATIC (IV SOLUTION) ×3 IMPLANT
KIT TURNOVER CYSTO (KITS) ×3 IMPLANT
PACK CYSTO AR (MISCELLANEOUS) ×3 IMPLANT
SET CYSTO W/LG BORE CLAMP LF (SET/KITS/TRAYS/PACK) ×3 IMPLANT
SHEATH NAVIGATOR HD 12/14X36 (SHEATH) IMPLANT
STENT URET 6FRX24 CONTOUR (STENTS) IMPLANT
STENT URET 6FRX26 CONTOUR (STENTS) IMPLANT
SURGILUBE 2OZ TUBE FLIPTOP (MISCELLANEOUS) ×3 IMPLANT
SYR 10ML LL (SYRINGE) ×3 IMPLANT
TRACTIP FLEXIVA PULSE ID 200 (Laser) IMPLANT
VALVE UROSEAL ADJ ENDO (VALVE) ×1 IMPLANT
WATER STERILE IRR 500ML POUR (IV SOLUTION) ×3 IMPLANT

## 2021-06-17 NOTE — Discharge Instructions (Signed)

## 2021-06-17 NOTE — Op Note (Signed)
Date of procedure: 06/17/21 ? ?Preoperative diagnosis:  ?Left proximal ureteral stone ? ?Postoperative diagnosis:  ?Stone passed spontaneously ? ?Procedure: ?Cystoscopy, left ureteral stent removal ?Left diagnostic ureteroscopy, left retrograde pyelogram with intraoperative interpretation ? ?Surgeon: Nickolas Madrid, MD ? ?Anesthesia: General ? ?Complications: None ? ?Intraoperative findings:  ?Normal cystoscopy, small benign-appearing cystic lesion at right lateral wall stable ?No evidence of left ureteral stone, suspect had passed spontaneously, contrast drained briskly from left kidney and no stent replaced ? ?EBL: None ? ?Specimens: None ? ?Drains: None ? ?Indication: Nicole Schmidt is a 56 y.o. patient who previously presented with renal failure and was found to have a 6 mm left UPJ stone and underwent urgent stent placement at that time in the setting of multiple comorbidities and pneumonia.  She has remained on dialysis despite ureteral stent placement.  After reviewing the management options for treatment, they elected to proceed with the above surgical procedure(s). We have discussed the potential benefits and risks of the procedure, side effects of the proposed treatment, the likelihood of the patient achieving the goals of the procedure, and any potential problems that might occur during the procedure or recuperation. Informed consent has been obtained. ? ?Description of procedure: ? ?The patient was taken to the operating room and general anesthesia was induced. SCDs were placed for DVT prophylaxis. The patient was placed in the dorsal lithotomy position, prepped and draped in the usual sterile fashion, and preoperative antibiotics(cefepime) were administered. A preoperative time-out was performed.  ? ?A 21 French rigid cystoscope was used to intubate the urethra and thorough cystoscopy was performed.  There was a small cystic, benign-appearing lesion at the right lateral wall that was stable from prior  cystoscopy, and the ureteral orifices were orthotopic bilaterally.  There was minimal encrustation of the left ureteral stent.  The stent was grasped and pulled to the meatus, and a sensor wire advanced through the stent up to the left kidney under fluoroscopic vision. ? ?A digital single-channel ureteroscope advanced easily over the wire up to the kidney under fluoroscopic vision.  Thorough pyeloscopy revealed no evidence of stones or any other abnormalities.  Careful pullback ureteroscopy also showed no evidence of ureteral stones or abnormalities.  A retrograde pyelogram was performed from the proximal ureter and showed no extravasation or filling defects.  Careful pullback ureteroscopy again showed a widely patent ureter with no stones or other abnormalities. ? ?The 44 French rigid cystoscope was reinserted into the bladder, and contrast drained briskly from the left kidney.  No stent was replaced. ? ?Disposition: Stable to PACU ? ?Plan: ?Follow-up with urology as needed ? ?Nickolas Madrid, MD ? ?

## 2021-06-17 NOTE — H&P (Signed)
? ?06/17/21 ?2:16 PM  ? ?Nicole Schmidt ?May 05, 1965 ?[id number] ? ?CC: left UPJ stone ? ?HPI: ?56 year old extremely comorbid female with morbid obesity and BMI of 59, CHF, CKD with prior baseline creatinine of 1.8, diabetes, history of PE on chronic anticoagulation, and pulmonary disease on PRN oxygen.  She was admitted with possible pneumonia in late December 2022, and CT ultimately showed a 7 mm left proximal ureteral stone and in the setting of her worsening renal function with a creatinine of 5.5(eGFR 8) at that time she underwent urgent left ureteral stent placement.  Unfortunately her renal function did not significantly improve, and she has been on dialysis despite stenting.  She has had multiple admissions since that time for hypoxia and hypercapnic respiratory failure, also diagnosed with possible UTI in February, however this was more likely asymptomatic bacteriuria. ?  ?She reports she has been making more urine lately, and she continues to follow closely with nephrology.  Follow-up renal ultrasound showed no hydronephrosis with stent in place. ?  ? ?PMH: ?Past Medical History:  ?Diagnosis Date  ? (HFpEF) heart failure with preserved ejection fraction ([place])   ? a. 10/2018 Echo: EF 60-65%, diast dysfxn, RVSP 50.7 mmHg; b. 01/2020 Echo: EF 65-70%, GrII DD; c. 01/2021 Echo: EF 60-65%, no rwma, mod LVH. Nl RV fxn, mildly enlarged RV. Mod BAE. Triv MR.  ? Acquired hypothyroidism 02/09/2020  ? Anemia   ? Arthritis   ? Breast cancer ([place]) 2014  ? right breast cancer  ? Breast cancer of upper-inner quadrant of right female breast ([place])   ? Right breast invasive CA and DCIS , 7 mm T1,N0,M0. Er/PR pos, her 2 negative.  Margins 1 mm.  ? Diabetes mellitus type 2 in obese ([place]) 02/09/2020  ? Dyslipidemia 02/09/2020  ? ESRD (end stage renal disease) ([place])   ? a. 02/2021 HD initiated.  ? Essential hypertension   ? Gout   ? Hypothyroidism   ? Menopause   ? age 63  ? Morbid obesity ([place])   ? Permanent atrial fibrillation  ([place])   ? a. Dx 10/2018 in setting of PNA. CHA2DS2VASc = 4-->Eliquis; b. 11/2018 s/p successful DCCV; c. Subsequent recurrent Afib-->rate-control strategy.  ? Personal history of radiation therapy   ? Pulmonary embolism ([place]) 10/2014  ? a. Chronic eliquis.  ? Thyroid goiter   ? ? ?Surgical History: ?Past Surgical History:  ?Procedure Laterality Date  ? BREAST BIOPSY Right 2014  ? breast ca  ? BREAST EXCISIONAL BIOPSY Right 07/19/2012  ? breast ca  ? BREAST SURGERY Right 2014  ? with sentinel node bx subareaolar duct excision  ? CARDIOVERSION N/A 11/28/2018  ? Procedure: CARDIOVERSION;  Surgeon: Minna Merritts, MD;  Location: [facility] ORS;  Service: Cardiovascular;  Laterality: N/A;  ? COLONOSCOPY WITH PROPOFOL N/A 01/30/2017  ? Procedure: COLONOSCOPY WITH PROPOFOL;  Surgeon: Christene Lye, MD;  Location: [facility] ENDOSCOPY;  Service: Endoscopy;  Laterality: N/A;  ? CYSTOSCOPY W/ URETERAL STENT PLACEMENT Left 02/14/2021  ? Procedure: CYSTOSCOPY WITH RETROGRADE PYELOGRAM/URETERAL STENT PLACEMENT;  Surgeon: Billey Co, MD;  Location: [facility] ORS;  Service: Urology;  Laterality: Left;  ? DIALYSIS/PERMA CATHETER INSERTION N/A 02/24/2021  ? Procedure: DIALYSIS/PERMA CATHETER INSERTION;  Surgeon: Algernon Huxley, MD;  Location: [facility] CV LAB;  Service: Cardiovascular;  Laterality: N/A;  ? ? ? ? ?Family History: ?Family History  ?Problem Relation Age of Onset  ? Diabetes Father   ? Hypertension Father   ? Parkinson's disease Father   ?  Hypertension Mother   ? Rheum arthritis Mother   ? Atrial fibrillation Mother   ? Heart failure Mother   ? Heart attack Mother   ?     died in her mid-70's.  ? Colon cancer Cousin 71  ? Breast cancer Neg Hx   ? ? ?Social History:  reports that she has never smoked. She has never been exposed to tobacco smoke. She has never used smokeless tobacco. She reports that she does not drink alcohol and does not use drugs. ? ?Physical Exam: ?BP 102/76   Pulse 88   Temp 97.8 ?F (36.6 ?C)  (Temporal)   Resp 17   Ht 5' 4" (1.626 m)   Wt (!) 149.7 kg   LMP 07/17/2012   SpO2 99%   BMI 56.64 kg/m?   ? ?Constitutional:  Alert and oriented, No acute distress. ?Cardiovascular:RRR ?Respiratory: breath sounds distant bilaterally, but clear ?GI: Abdomen is soft, nontender, nondistended, no abdominal masses ? ? ?Laboratory Data: ?URINE CULTURE 06/08/21 with E Coli, treated with culture appropriate abx ? ? ?Assessment & Plan:   ?We specifically discussed the risks ureteroscopy including bleeding, infection/sepsis, stent related symptoms including flank pain/urgency/frequency/incontinence/dysuria, ureteral injury, inability to access stone, or need for staged or additional procedures. We discussed this is unlikely to change or improve her renal function, and she will likely remain dialysis dependent, however the stent cannot stay and long-term without causing UTIs or encrustation, and stone will need to be removed. ? ?Left URS/laser/stent ? ? ?Nickolas Madrid, MD ?06/17/2021 ? ?[facility] ?[address], [address] ?[place], [place] [address] ?[phone] ? ?

## 2021-06-17 NOTE — Transfer of Care (Signed)
Immediate Anesthesia Transfer of Care Note ? ?Patient: Nicole Schmidt ? ?Procedure(s) Performed: CYSTOSCOPY/URETEROSCOPY/HOLMIUM LASER/STENT EXCHANGE (Left) ?CYSTOSCOPY WITH RETROGRADE PYELOGRAM ? ?Patient Location: PACU ? ?Anesthesia Type:General ? ?Level of Consciousness: awake, alert  and oriented ? ?Airway & Oxygen Therapy: Patient Spontanous Breathing and Patient connected to face mask oxygen ? ?Post-op Assessment: Report given to RN and Post -op Vital signs reviewed and stable ? ?Post vital signs: Reviewed and stable ? ?Last Vitals:  ?Vitals Value Taken Time  ?BP 99/60 06/17/21 1537  ?Temp    ?Pulse 78 06/17/21 1541  ?Resp 17 06/17/21 1541  ?SpO2 99 % 06/17/21 1541  ?Vitals shown include unvalidated device data. ? ?Last Pain:  ?Vitals:  ? 06/17/21 1357  ?TempSrc: Temporal  ?PainSc: 0-No pain  ?   ? ?  ? ?Complications: No notable events documented. ?

## 2021-06-17 NOTE — Anesthesia Procedure Notes (Signed)
Procedure Name: Intubation ?Date/Time: 06/17/2021 2:57 PM ?Performed by: Aline Brochure, CRNA ?Pre-anesthesia Checklist: Patient identified, Patient being monitored, Timeout performed, Emergency Drugs available and Suction available ?Patient Re-evaluated:Patient Re-evaluated prior to induction ?Oxygen Delivery Method: Circle system utilized ?Preoxygenation: Pre-oxygenation with 100% oxygen ?Induction Type: IV induction ?Ventilation: Mask ventilation without difficulty ?Laryngoscope Size: 3 and McGraph ?Grade View: Grade I ?Tube type: Oral ?Tube size: 7.0 mm ?Number of attempts: 1 ?Airway Equipment and Method: Stylet ?Placement Confirmation: ETT inserted through vocal cords under direct vision, positive ETCO2 and breath sounds checked- equal and bilateral ?Secured at: 20 cm ?Tube secured with: Tape ?Dental Injury: Teeth and Oropharynx as per pre-operative assessment  ? ? ? ? ?

## 2021-06-17 NOTE — Anesthesia Preprocedure Evaluation (Addendum)
Anesthesia Evaluation  ?Patient identified by MRN, date of birth, ID band ?Patient awake ? ? ? ?Reviewed: ?Allergy & Precautions, NPO status , Patient's Chart, lab work & pertinent test results ? ?History of Anesthesia Complications ?Negative for: history of anesthetic complications ? ?Airway ?Mallampati: IV ? ? ?Neck ROM: Full ? ? ? Dental ?no notable dental hx. ? ?  ?Pulmonary ?sleep apnea and Continuous Positive Airway Pressure Ventilation , COPD (on 2L home O2 at night),  ?  ?Pulmonary exam normal ?breath sounds clear to auscultation ? ? ? ? ? ? Cardiovascular ?hypertension, +CHF  ?Normal cardiovascular exam+ dysrhythmias (a fib)  ?Rhythm:Regular Rate:Normal ? 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 ?ECG 06/15/21: a fib; no change from prior ?  ?Neuro/Psych ?negative neurological ROS ?   ? GI/Hepatic ?negative GI ROS,   ?Endo/Other  ?diabetes, Type 2Hypothyroidism Class 3 obesity ? Renal/GU ?ESRF and DialysisRenal disease (stage III CKD; nephrolithiasis; last HD 06/16/21)  ? ?  ?Musculoskeletal ? ?(+) Arthritis ,  ? Abdominal ?  ?Peds ? Hematology ? ?(+) Blood dyscrasia, anemia , Breast CA   ?Anesthesia Other Findings ? ? Reproductive/Obstetrics ? ?  ? ? ? ? ? ? ? ? ? ? ? ? ? ?  ?  ? ? ? ? ? ? ? ?Anesthesia Physical ?Anesthesia Plan ? ?ASA: 4 ? ?Anesthesia Plan: General  ? ?Post-op Pain Management:   ? ?Induction: Intravenous ? ?PONV Risk Score and Plan: 3 and Ondansetron, Dexamethasone and Treatment may vary due to age or medical condition ? ?Airway Management Planned: Oral ETT ? ?Additional Equipment:  ? ?Intra-op Plan:  ? ?Post-operative Plan: Extubation in OR ? ?Informed Consent: I have reviewed the patients History and Physical, chart, labs and discussed the procedure including the risks, benefits and alternatives for the proposed anesthesia with the patient or authorized representative who has indicated his/her understanding and acceptance.  ? ? ? ?Dental advisory given ? ?Plan Discussed  with: CRNA ? ?Anesthesia Plan Comments: (Patient consented for risks of anesthesia including but not limited to:  ?- adverse reactions to medications ?- damage to eyes, teeth, lips or other oral mucosa ?- nerve damage due to positioning  ?- sore throat or hoarseness ?- damage to heart, brain, nerves, lungs, other parts of body or loss of life ? ?Informed patient about role of CRNA in peri- and intra-operative care.  Patient voiced understanding.)  ? ? ? ? ? ? ?Anesthesia Quick Evaluation ? ?

## 2021-06-20 ENCOUNTER — Encounter: Payer: Self-pay | Admitting: Urology

## 2021-06-20 NOTE — Anesthesia Postprocedure Evaluation (Signed)
Anesthesia Post Note ? ?Patient: Nicole Schmidt ? ?Procedure(s) Performed: CYSTOSCOPY/URETEROSCOPY/HOLMIUM LASER/STENT EXCHANGE (Left) ?CYSTOSCOPY WITH RETROGRADE PYELOGRAM ? ?Patient location during evaluation: PACU ?Anesthesia Type: General ?Level of consciousness: awake and alert, oriented and patient cooperative ?Pain management: pain level controlled ?Vital Signs Assessment: post-procedure vital signs reviewed and stable ?Respiratory status: spontaneous breathing, nonlabored ventilation and respiratory function stable ?Cardiovascular status: blood pressure returned to baseline and stable ?Postop Assessment: adequate PO intake ?Anesthetic complications: no ? ? ?No notable events documented. ? ? ?Last Vitals:  ?Vitals:  ? 06/17/21 1558 06/17/21 1611  ?BP: (!) 101/52 112/67  ?Pulse: 79 77  ?Resp: 18 18  ?Temp: 36.4 ?C (!) 36.2 ?C  ?SpO2: 96% 96%  ?  ?Last Pain:  ?Vitals:  ? 06/17/21 1611  ?TempSrc: Temporal  ?PainSc: 0-No pain  ? ? ?  ?  ?  ?  ?  ?  ? ?Darrin Nipper ? ? ? ? ?

## 2021-06-22 DIAGNOSIS — I214 Non-ST elevation (NSTEMI) myocardial infarction: Secondary | ICD-10-CM | POA: Diagnosis not present

## 2021-06-22 DIAGNOSIS — G4733 Obstructive sleep apnea (adult) (pediatric): Secondary | ICD-10-CM | POA: Diagnosis not present

## 2021-06-22 DIAGNOSIS — D631 Anemia in chronic kidney disease: Secondary | ICD-10-CM | POA: Diagnosis not present

## 2021-06-22 DIAGNOSIS — J9601 Acute respiratory failure with hypoxia: Secondary | ICD-10-CM | POA: Diagnosis not present

## 2021-06-22 DIAGNOSIS — E1122 Type 2 diabetes mellitus with diabetic chronic kidney disease: Secondary | ICD-10-CM | POA: Diagnosis not present

## 2021-06-22 DIAGNOSIS — E039 Hypothyroidism, unspecified: Secondary | ICD-10-CM | POA: Diagnosis not present

## 2021-06-22 DIAGNOSIS — N186 End stage renal disease: Secondary | ICD-10-CM | POA: Diagnosis not present

## 2021-06-22 DIAGNOSIS — J9622 Acute and chronic respiratory failure with hypercapnia: Secondary | ICD-10-CM | POA: Diagnosis not present

## 2021-06-22 DIAGNOSIS — E785 Hyperlipidemia, unspecified: Secondary | ICD-10-CM | POA: Diagnosis not present

## 2021-06-22 DIAGNOSIS — I132 Hypertensive heart and chronic kidney disease with heart failure and with stage 5 chronic kidney disease, or end stage renal disease: Secondary | ICD-10-CM | POA: Diagnosis not present

## 2021-06-22 DIAGNOSIS — I480 Paroxysmal atrial fibrillation: Secondary | ICD-10-CM | POA: Diagnosis not present

## 2021-06-22 DIAGNOSIS — I5030 Unspecified diastolic (congestive) heart failure: Secondary | ICD-10-CM | POA: Diagnosis not present

## 2021-06-22 DIAGNOSIS — J9611 Chronic respiratory failure with hypoxia: Secondary | ICD-10-CM | POA: Diagnosis not present

## 2021-06-22 DIAGNOSIS — I482 Chronic atrial fibrillation, unspecified: Secondary | ICD-10-CM | POA: Diagnosis not present

## 2021-06-22 DIAGNOSIS — I5032 Chronic diastolic (congestive) heart failure: Secondary | ICD-10-CM | POA: Diagnosis not present

## 2021-06-22 DIAGNOSIS — D509 Iron deficiency anemia, unspecified: Secondary | ICD-10-CM | POA: Diagnosis not present

## 2021-06-27 ENCOUNTER — Telehealth (INDEPENDENT_AMBULATORY_CARE_PROVIDER_SITE_OTHER): Payer: Self-pay | Admitting: Nurse Practitioner

## 2021-06-27 NOTE — Telephone Encounter (Signed)
Hassan Rowan from [facility] called and need medical records and chart notes in regarding claim # [id number] for patient for services on 1.12.23.  Please fax medical records and chart notes to [phone], Attention: Reference #[id number]. ?

## 2021-06-28 ENCOUNTER — Inpatient Hospital Stay: Payer: BC Managed Care – PPO | Admitting: Physical Medicine and Rehabilitation

## 2021-06-29 NOTE — Telephone Encounter (Signed)
Medical records sent ?

## 2021-07-01 ENCOUNTER — Telehealth (INDEPENDENT_AMBULATORY_CARE_PROVIDER_SITE_OTHER): Payer: Self-pay

## 2021-07-01 NOTE — Telephone Encounter (Signed)
Spoke with the patient and she is scheduled with Dr. Delana Meyer for a left radialcephalic AV fistula on 44/69/50 at the [facility]. Pre-op phone call is on 07/14/21 between 8-1 pm. Pre-surgical instructions were discussed and will be mailed

## 2021-07-02 ENCOUNTER — Encounter (INDEPENDENT_AMBULATORY_CARE_PROVIDER_SITE_OTHER): Payer: Self-pay | Admitting: Nurse Practitioner

## 2021-07-02 NOTE — Progress Notes (Signed)
Subjective:    Patient ID: Nicole Schmidt, female    DOB: June 10, 1965, 56 y.o.   MRN: [id number] No chief complaint on file.    The patient is seen for evaluation of dialysis access.  Current access is via a catheter which is functioning well .  This is the patient's first dialysis access.  There have not been multiple episodes of catheter infection.  The patient denies fever and chills while on dialysis.  No tenderness or drainage at the exit site.  No recent shortening of the patient's walking distance or new symptoms consistent with claudication.  No history of rest pain symptoms. No new ulcers or wounds of the lower extremities have occurred.  The patient is right-hand dominant.  The patient denies amaurosis fugax or recent TIA symptoms. There are no recent neurological changes noted. There is no history of DVT, PE or superficial thrombophlebitis. No recent episodes of angina or shortness of breath documented.    The patient has adequate access for a left radiocephalic AV fistula.   Review of Systems  All other systems reviewed and are negative.     Objective:   Physical Exam Vitals reviewed.  HENT:     Head: Normocephalic.  Cardiovascular:     Rate and Rhythm: Normal rate.     Pulses:          Radial pulses are 1+ on the right side and 1+ on the left side.  Pulmonary:     Effort: Pulmonary effort is normal.  Skin:    General: Skin is warm and dry.  Neurological:     Mental Status: She is alert and oriented to person, place, and time.  Psychiatric:        Mood and Affect: Mood normal.        Behavior: Behavior normal.        Thought Content: Thought content normal.        Judgment: Judgment normal.    BP 112/78 (BP Location: Left Arm)   Pulse (!) 109   Resp 18   Ht '5\' 4"'$  (1.626 m)   Wt (!) 337 lb (152.9 kg)   LMP 07/17/2012   BMI 57.85 kg/m   Past Medical History:  Diagnosis Date   (HFpEF) heart failure with preserved ejection fraction ([place])    a. 10/2018  Echo: EF 60-65%, diast dysfxn, RVSP 50.7 mmHg; b. 01/2020 Echo: EF 65-70%, GrII DD; c. 01/2021 Echo: EF 60-65%, no rwma, mod LVH. Nl RV fxn, mildly enlarged RV. Mod BAE. Triv MR.   Acquired hypothyroidism 02/09/2020   Anemia    Arthritis    Breast cancer ([place]) 2014   right breast cancer   Breast cancer of upper-inner quadrant of right female breast ([place])    Right breast invasive CA and DCIS , 7 mm T1,N0,M0. Er/PR pos, her 2 negative.  Margins 1 mm.   Diabetes mellitus type 2 in obese ([place]) 02/09/2020   Dyslipidemia 02/09/2020   ESRD (end stage renal disease) ([place])    a. 02/2021 HD initiated.   Essential hypertension    Gout    Hypothyroidism    Menopause    age 69   Morbid obesity ([place])    Permanent atrial fibrillation ([place])    a. Dx 10/2018 in setting of PNA. CHA2DS2VASc = 4-->Eliquis; b. 11/2018 s/p successful DCCV; c. Subsequent recurrent Afib-->rate-control strategy.   Personal history of radiation therapy    Pulmonary embolism ([place]) 10/2014   a. Chronic eliquis.   Thyroid  goiter     Social History   Socioeconomic History   Marital status: Single    Spouse name: Not on file   Number of children: Not on file   Years of education: Not on file   Highest education level: Not on file  Occupational History   Occupation: retired  Tobacco Use   Smoking status: Never    Passive exposure: Never   Smokeless tobacco: Never  Vaping Use   Vaping Use: Never used  Substance and Sexual Activity   Alcohol use: No    Alcohol/week: 0.0 standard drinks   Drug use: No   Sexual activity: Not on file  Other Topics Concern   Not on file  Social History Narrative   Lives in [place] by herself and retired.    Social Determinants of Health   Financial Resource Strain: Not on file  Food Insecurity: Not on file  Transportation Needs: Not on file  Physical Activity: Not on file  Stress: Not on file  Social Connections: Not on file  Intimate Partner Violence: Not on file    Past  Surgical History:  Procedure Laterality Date   BREAST BIOPSY Right 2014   breast ca   BREAST EXCISIONAL BIOPSY Right 07/19/2012   breast ca   BREAST SURGERY Right 2014   with sentinel node bx subareaolar duct excision   CARDIOVERSION N/A 11/28/2018   Procedure: CARDIOVERSION;  Surgeon: Minna Merritts, MD;  Location: [facility];  Service: Cardiovascular;  Laterality: N/A;   COLONOSCOPY WITH PROPOFOL N/A 01/30/2017   Procedure: COLONOSCOPY WITH PROPOFOL;  Surgeon: Christene Lye, MD;  Location: [facility];  Service: Endoscopy;  Laterality: N/A;   CYSTOSCOPY W/ RETROGRADES  06/17/2021   Procedure: CYSTOSCOPY WITH RETROGRADE PYELOGRAM;  Surgeon: Billey Co, MD;  Location: [facility];  Service: Urology;;   CYSTOSCOPY W/ URETERAL STENT PLACEMENT Left 02/14/2021   Procedure: CYSTOSCOPY WITH RETROGRADE PYELOGRAM/URETERAL STENT PLACEMENT;  Surgeon: Billey Co, MD;  Location: [facility];  Service: Urology;  Laterality: Left;   CYSTOSCOPY/URETEROSCOPY/HOLMIUM LASER/STENT PLACEMENT Left 06/17/2021   Procedure: CYSTOSCOPY/URETEROSCOPY/HOLMIUM LASER/STENT EXCHANGE;  Surgeon: Billey Co, MD;  Location: [facility];  Service: Urology;  Laterality: Left;   DIALYSIS/PERMA CATHETER INSERTION N/A 02/24/2021   Procedure: DIALYSIS/PERMA CATHETER INSERTION;  Surgeon: Algernon Huxley, MD;  Location: [facility];  Service: Cardiovascular;  Laterality: N/A;    Family History  Problem Relation Age of Onset   Diabetes Father    Hypertension Father    Parkinson's disease Father    Hypertension Mother    Rheum arthritis Mother    Atrial fibrillation Mother    Heart failure Mother    Heart attack Mother        died in her mid-70's.   Colon cancer Cousin 54   Breast cancer Neg Hx     Allergies  Allergen Reactions   Spironolactone     Hyperkalemia  Pt unaware of this allergy       Latest Ref Rng & Units 06/17/2021    2:15 PM 06/15/2021   12:51 PM 06/06/2021    1:29 PM  CBC  WBC  4.0 - 10.5 K/uL  8.8   10.7    Hemoglobin 12.0 - 15.0 g/dL 14.3   12.5   13.5    Hematocrit 36.0 - 46.0 % 42.0   41.0   43.8    Platelets 150 - 400 K/uL  192   202        CMP  Component Value Date/Time   NA 131 (L) 06/17/2021 1415   NA 144 02/27/2020 0931   NA 141 03/09/2014 1533   K 4.9 06/17/2021 1415   K 4.2 03/09/2014 1533   CL 94 (L) 06/17/2021 1415   CL 101 03/09/2014 1533   CO2 27 06/15/2021 1251   CO2 30 03/09/2014 1533   GLUCOSE 127 (H) 06/17/2021 1415   GLUCOSE 95 03/09/2014 1533   BUN 42 (H) 06/17/2021 1415   BUN 40 (H) 02/27/2020 0931   BUN 35 (H) 03/09/2014 1533   CREATININE 6.90 (H) 06/17/2021 1415   CREATININE 1.50 (H) 03/09/2014 1533   CALCIUM 9.1 06/15/2021 1251   CALCIUM 8.8 03/09/2014 1533   PROT 7.1 06/15/2021 1251   PROT 7.3 03/09/2014 1533   ALBUMIN 3.3 (L) 06/15/2021 1251   ALBUMIN 3.5 03/09/2014 1533   AST 18 06/15/2021 1251   AST 16 03/09/2014 1533   ALT 20 06/15/2021 1251   ALT 23 03/09/2014 1533   ALKPHOS 140 (H) 06/15/2021 1251   ALKPHOS 113 03/09/2014 1533   BILITOT 0.6 06/15/2021 1251   BILITOT 0.3 03/09/2014 1533   GFRNONAA 8 (L) 06/15/2021 1251   GFRNONAA 39 (L) 03/09/2014 1533   GFRNONAA 55 (L) 07/31/2013 1521   GFRAA 35 (L) 02/27/2020 0931   GFRAA 47 (L) 03/09/2014 1533   GFRAA >60 07/31/2013 1521     No results found.     Assessment & Plan:   1. ESRD on hemodialysis ([place]) Recommend:  At this time the patient does not have appropriate extremity access for dialysis  Patient should have a left radiocephalic AV fistula created.  The risks, benefits and alternative therapies were reviewed in detail with the patient.  All questions were answered.  The patient agrees to proceed with surgery.   The patient will follow up with me in the office after the surgery.   2. Controlled type 2 diabetes mellitus with hyperglycemia, without long-term current use of insulin (HCC) Continue hypoglycemic medications as already ordered,  these medications have been reviewed and there are no changes at this time.  Hgb A1C to be monitored as already arranged by primary service   3. Dyslipidemia Continue statin as ordered and reviewed, no changes at this time    Current Outpatient Medications on File Prior to Visit  Medication Sig Dispense Refill   acetaminophen (TYLENOL) 325 MG tablet Take 2 tablets (650 mg total) by mouth every 6 (six) hours as needed for mild pain (or Fever >/= 101).     ADVAIR DISKUS 250-50 MCG/ACT AEPB Inhale 1 puff into the lungs 2 (two) times daily.     albuterol (VENTOLIN HFA) 108 (90 Base) MCG/ACT inhaler Inhale 2 puffs into the lungs every 6 (six) hours as needed for wheezing or shortness of breath. 8.5 g 0   amLODipine (NORVASC) 5 MG tablet Take 1 tablet (5 mg total) by mouth daily. 90 tablet 3   apixaban (ELIQUIS) 5 MG TABS tablet Take 1 tablet (5 mg total) by mouth 2 (two) times daily. 180 tablet 1   atorvastatin (LIPITOR) 20 MG tablet Take 1 tablet (20 mg total) by mouth at bedtime. 90 tablet 3   calcitRIOL (ROCALTROL) 0.25 MCG capsule Take 1 capsule (0.25 mcg total) by mouth daily. 30 capsule 0   calcium carbonate (OS-CAL - DOSED IN MG OF ELEMENTAL CALCIUM) 1250 (500 Ca) MG tablet Take 1 tablet (1,250 mg total) by mouth daily with breakfast.     cloNIDine (CATAPRES) 0.2 MG tablet  Take 0.2 mg by mouth 2 (two) times daily.     diclofenac Sodium (VOLTAREN) 1 % GEL Apply 2 g topically 4 (four) times daily. (Patient taking differently: Apply 2 g topically 4 (four) times daily as needed (pain).) 100 g 0   ergocalciferol (VITAMIN D2) 1.25 MG (50000 UT) capsule Take 1 capsule (50,000 Units total) by mouth once a week. (Patient taking differently: Take 50,000 Units by mouth every Friday.) 5 capsule 0   ferric gluconate 250 mg in sodium chloride 0.9 % 250 mL Inject 250 mg into the vein Every Tuesday,Thursday,and Saturday with dialysis.     folic acid (FOLVITE) 1 MG tablet Take 1 tablet (1 mg total) by mouth  daily. 30 tablet 0   gabapentin (NEURONTIN) 300 MG capsule Take 1 capsule (300 mg total) by mouth at bedtime. 30 capsule 0   Lancets (ONETOUCH DELICA PLUS [id number]) MISC      levothyroxine (SYNTHROID) 112 MCG tablet Take 1 tablet (112 mcg total) by mouth daily before breakfast. 30 tablet 0   melatonin 5 MG TABS Take 2 tablets (10 mg total) by mouth at bedtime as needed (sleep). 60 tablet 0   metoprolol succinate (TOPROL-XL) 25 MG 24 hr tablet Take 1 tablet (25 mg total) by mouth daily. Take with or immediately following a meal. 90 tablet 3   midodrine (PROAMATINE) 10 MG tablet Take 1 tablet (10 mg total) by mouth Every Tuesday,Thursday,and Saturday with dialysis. (Patient taking differently: Take 10 mg by mouth Every Tuesday,Thursday,and Saturday with dialysis. ONLY TAKES AS NEEDED FOR LOW BLOOD PRESSURE) 30 tablet 0   ONETOUCH ULTRA test strip      polyethylene glycol powder (GLYCOLAX/MIRALAX) 17 GM/SCOOP powder Take 1 capful (17 g) with water by mouth daily as needed for moderate constipation. 238 g 0   sevelamer carbonate (RENVELA) 800 MG tablet Take 1 tablet (800 mg total) by mouth 3 (three) times daily with meals. 90 tablet 0   traMADol (ULTRAM) 50 MG tablet Take 1 tablet (50 mg total) by mouth every 12 (twelve) hours as needed for moderate pain. 30 tablet 0   TRULICITY 1.5 QD/8.2ME SOPN Inject 1.5 mg into the skin every Monday.     famotidine (PEPCID) 10 MG tablet Take 1 tablet (10 mg total) by mouth daily. (Patient not taking: Reported on 06/09/2021) 30 tablet 0   Multiple Vitamins-Minerals (CERTAVITE/ANTIOXIDANTS) TABS Take 1 tablet by mouth at bedtime. (Patient not taking: Reported on 06/13/2021) 30 tablet 0   No current facility-administered medications on file prior to visit.    There are no Patient Instructions on file for this visit. No follow-ups on file.   Kris Hartmann, NP

## 2021-07-13 ENCOUNTER — Other Ambulatory Visit (INDEPENDENT_AMBULATORY_CARE_PROVIDER_SITE_OTHER): Payer: Self-pay | Admitting: Nurse Practitioner

## 2021-07-13 ENCOUNTER — Encounter
Admission: RE | Admit: 2021-07-13 | Discharge: 2021-07-13 | Disposition: A | Discharge status: Home / Self Care | Payer: BC Managed Care – PPO | Source: Ambulatory Visit | Attending: Vascular Surgery | Admitting: Vascular Surgery

## 2021-07-13 ENCOUNTER — Telehealth: Payer: Self-pay | Admitting: *Deleted

## 2021-07-13 ENCOUNTER — Encounter: Payer: Self-pay | Admitting: Vascular Surgery

## 2021-07-13 DIAGNOSIS — I700 Atherosclerosis of aorta: Secondary | ICD-10-CM | POA: Insufficient documentation

## 2021-07-13 DIAGNOSIS — Z01818 Encounter for other preprocedural examination: Secondary | ICD-10-CM

## 2021-07-13 DIAGNOSIS — N186 End stage renal disease: Secondary | ICD-10-CM | POA: Diagnosis not present

## 2021-07-13 DIAGNOSIS — Z992 Dependence on renal dialysis: Secondary | ICD-10-CM | POA: Diagnosis not present

## 2021-07-13 HISTORY — DX: Chronic obstructive pulmonary disease, unspecified: J44.9

## 2021-07-13 HISTORY — DX: Personal history of urinary calculi: Z87.442

## 2021-07-13 NOTE — Patient Instructions (Addendum)
Your procedure is scheduled on:07-22-21 Friday Report to the Registration Desk on the 1st floor of the [facility].Then proceed to the 2nd floor Surgery Desk To find out your arrival time, please call [phone] between 1PM - 3PM on:07-21-21 Thursday If your arrival time is 6:00 am, do not arrive prior to that time as the [facility] entrance doors do not open until 6:00 am.  REMEMBER: Instructions that are not followed completely may result in serious medical risk, up to and including death; or upon the discretion of your surgeon and anesthesiologist your surgery may need to be rescheduled.  Do not eat food OR drink any liquids after midnight the night before surgery.  No gum chewing, lozengers or hard candies.  TAKE THESE MEDICATIONS THE MORNING OF SURGERY WITH A SIP OF WATER: -amLODipine (NORVASC) -cloNIDine (CATAPRES) -levothyroxine (SYNTHROID)  -metoprolol succinate (TOPROL-XL)  Stop your apixaban (ELIQUIS) 3 days prior to surgery-Last dose will be on 07-18-21 Monday  Bring your midodrine (PROAMATINE) bottle to the hospital the day of surgery  Use your ADVAIR and Albuterol Inhaler the day of surgery and bring your Albuterol Inhaler to the hospital  One week prior to surgery: Stop Anti-inflammatories (NSAIDS) such as Advil, Aleve, Ibuprofen, Motrin, Naproxen, Naprosyn and Aspirin based products such as Excedrin, Goodys Powder, BC Powder.You may however, continue to take Tylenol/Tramadol if needed for pain up until the day of surgery. Stop ANY OVER THE COUNTER supplements until after surgery.  No Alcohol for 24 hours before or after surgery.  No Smoking including e-cigarettes for 24 hours prior to surgery.  No chewable tobacco products for at least 6 hours prior to surgery.  No nicotine patches on the day of surgery.  Do not use any "recreational" drugs for at least a week prior to your surgery.  Please be advised that the combination of cocaine and anesthesia may have negative  outcomes, up to and including death. If you test positive for cocaine, your surgery will be cancelled.  On the morning of surgery brush your teeth with toothpaste and water, you may rinse your mouth with mouthwash if you wish. Do not swallow any toothpaste or mouthwash.  Use CHG wipes as directed on instruction sheet.  Do not wear jewelry, make-up, hairpins, clips or nail polish.  Do not wear lotions, powders, or perfumes.   Do not shave body from the neck down 48 hours prior to surgery just in case you cut yourself which could leave a site for infection.  Also, freshly shaved skin may become irritated if using the CHG soap.  Contact lenses, hearing aids and dentures may not be worn into surgery.  Do not bring valuables to the hospital. [facility] is not responsible for any missing/lost belongings or valuables.   Bring your Trilogy (Astral) to the hospital with you   Notify your doctor if there is any change in your medical condition (cold, fever, infection).  Wear comfortable clothing (specific to your surgery type) to the hospital.  After surgery, you can help prevent lung complications by doing breathing exercises.  Take deep breaths and cough every 1-2 hours. Your doctor may order a device called an Incentive Spirometer to help you take deep breaths. When coughing or sneezing, hold a pillow firmly against your incision with both hands. This is called "splinting." Doing this helps protect your incision. It also decreases belly discomfort.  If you are being admitted to the hospital overnight, leave your suitcase in the car. After surgery it may be  brought to your room.  If you are being discharged the day of surgery, you will not be allowed to drive home. You will need a responsible adult (18 years or older) to drive you home and stay with you that night.   If you are taking public transportation, you will need to have a responsible adult (18 years or older) with you. Please  confirm with your physician that it is acceptable to use public transportation.   Please call the [facility] Dept. at [phone] if you have any questions about these instructions.  Surgery Visitation Policy:  Patients undergoing a surgery or procedure may have two family members or support persons with them as long as the person is not COVID-19 positive or experiencing its symptoms.

## 2021-07-13 NOTE — Telephone Encounter (Signed)
Request for pre-operative cardiac clearance Received: Today Karen Kitchens, NP  P Cv Div Preop Callback Request for pre-operative cardiac clearance:     1. What type of surgery is being performed?  ARTERIOVENOUS (AV) FISTULA CREATION (RADIALCEPHALIC)   2. When is this surgery scheduled?  07/22/2021     3. Type of clearance being requested (medical, pharmacy, both).  BOTH     4. Are there any medications that need to be held prior to surgery?  APIXABAN   5. Practice name and name of physician performing surgery?  Performing surgeon: Dr. [name], MD  Requesting clearance: Honor Loh, FNP-C       6. Anesthesia type (none, local, MAC, general)?  GENERAL   7. What is the office phone and fax number?    Phone: [phone]  Fax: [phone]   ATTENTION: Unable to create telephone message as per your standard workflow. Directed by [facility] providers to send requests for cardiac clearance to this pool for appropriate distribution to provider covering pre-operative clearances.   Honor Loh, MSN, APRN, FNP-C, CEN  [facility]  Peri-operative Services Nurse Practitioner  Phone: [phone]  07/13/21 12:35 PM

## 2021-07-13 NOTE — Telephone Encounter (Signed)
Patient with diagnosis of afib and PE (2016) on Eliquis for anticoagulation.    Procedure: AV fistula creation Date of procedure: 07/22/21  CHA2DS2-VASc Score = 5  This indicates a 7.2% annual risk of stroke. The patient's score is based upon: CHF History: 1 HTN History: 1 Diabetes History: 1 Stroke History: 0 Vascular Disease History: 1 Age Score: 0 Gender Score: 1   Aortic atherosclerosis noted on abdominal CT 01/2021, PMH updated.  CrCl 55m/min using adjusted body weight due to obesity Platelet count 192K  Per office protocol, patient can hold Eliquis for 2 days prior to procedure.

## 2021-07-14 ENCOUNTER — Inpatient Hospital Stay: Admission: RE | Admit: 2021-07-14 | Payer: BC Managed Care – PPO | Source: Ambulatory Visit

## 2021-07-15 ENCOUNTER — Telehealth: Payer: Self-pay | Admitting: *Deleted

## 2021-07-15 NOTE — Telephone Encounter (Signed)
Pt agreeable to plan of care for tele pre op appt 07/19/21 @ 1:20 pm. Med rec and consent are done.     Patient Consent for Virtual Visit        Nicole Schmidt has provided verbal consent on 07/15/2021 for a virtual visit (video or telephone).   CONSENT FOR VIRTUAL VISIT FOR:  [name]  By participating in this virtual visit I agree to the following:  I hereby voluntarily request, consent and authorize [facility] and its employed or contracted physicians, physician assistants, nurse practitioners or other licensed health care professionals (the Practitioner), to provide me with telemedicine health care services (the "Services") as deemed necessary by the treating Practitioner. I acknowledge and consent to receive the Services by the Practitioner via telemedicine. I understand that the telemedicine visit will involve communicating with the Practitioner through live audiovisual communication technology and the disclosure of certain medical information by electronic transmission. I acknowledge that I have been given the opportunity to request an in-person assessment or other available alternative prior to the telemedicine visit and am voluntarily participating in the telemedicine visit.  I understand that I have the right to withhold or withdraw my consent to the use of telemedicine in the course of my care at any time, without affecting my right to future care or treatment, and that the Practitioner or I may terminate the telemedicine visit at any time. I understand that I have the right to inspect all information obtained and/or recorded in the course of the telemedicine visit and may receive copies of available information for a reasonable fee.  I understand that some of the potential risks of receiving the Services via telemedicine include:  Delay or interruption in medical evaluation due to technological equipment failure or disruption; Information transmitted may not be sufficient (e.g.  poor resolution of images) to allow for appropriate medical decision making by the Practitioner; and/or  In rare instances, security protocols could fail, causing a breach of personal health information.  Furthermore, I acknowledge that it is my responsibility to provide information about my medical history, conditions and care that is complete and accurate to the best of my ability. I acknowledge that Practitioner's advice, recommendations, and/or decision may be based on factors not within their control, such as incomplete or inaccurate data provided by me or distortions of diagnostic images or specimens that may result from electronic transmissions. I understand that the practice of medicine is not an exact science and that Practitioner makes no warranties or guarantees regarding treatment outcomes. I acknowledge that a copy of this consent can be made available to me via my patient portal ([facility]), or I can request a printed copy by calling the office of [facility].    I understand that my insurance will be billed for this visit.   I have read or had this consent read to me. I understand the contents of this consent, which adequately explains the benefits and risks of the Services being provided via telemedicine.  I have been provided ample opportunity to ask questions regarding this consent and the Services and have had my questions answered to my satisfaction. I give my informed consent for the services to be provided through the use of telemedicine in my medical care

## 2021-07-15 NOTE — Telephone Encounter (Addendum)
    Name: Nicole Schmidt  DOB: 1965/06/11  MRN: [id number]  Primary Cardiologist: Kathlyn Sacramento, MD   Preoperative team, please contact this patient and set up a phone call appointment for further preoperative risk assessment. Please obtain consent and complete medication review. Thank you for your help.  I confirm that guidance regarding antiplatelet and oral anticoagulation therapy has been completed and, if necessary, noted below.  Per office protocol, patient can hold Eliquis for 2 days prior to procedure.   Mable Fill, Marissa Nestle, NP 07/15/2021, 4:59 PM [facility] [address] [place], [place] [address]

## 2021-07-15 NOTE — Telephone Encounter (Signed)
Pt agreeable to plan of care for tele pre op appt 07/19/21 @ 1:20 pm. Med rec and consent are done.   I will send FYI to requesting provider pt has tele appt 07/19/21

## 2021-07-18 ENCOUNTER — Encounter
Admission: RE | Admit: 2021-07-18 | Discharge: 2021-07-18 | Disposition: A | Discharge status: Home / Self Care | Payer: BC Managed Care – PPO | Source: Ambulatory Visit | Attending: Vascular Surgery | Admitting: Vascular Surgery

## 2021-07-18 DIAGNOSIS — Z01812 Encounter for preprocedural laboratory examination: Secondary | ICD-10-CM | POA: Diagnosis not present

## 2021-07-18 DIAGNOSIS — N186 End stage renal disease: Secondary | ICD-10-CM | POA: Diagnosis not present

## 2021-07-18 DIAGNOSIS — Z992 Dependence on renal dialysis: Secondary | ICD-10-CM | POA: Diagnosis not present

## 2021-07-18 LAB — TYPE AND SCREEN
ABO/RH(D): O NEG
Antibody Screen: NEGATIVE

## 2021-07-19 ENCOUNTER — Ambulatory Visit (INDEPENDENT_AMBULATORY_CARE_PROVIDER_SITE_OTHER): Payer: BC Managed Care – PPO | Admitting: Physician Assistant

## 2021-07-19 ENCOUNTER — Encounter: Payer: Self-pay | Admitting: Vascular Surgery

## 2021-07-19 DIAGNOSIS — Z01810 Encounter for preprocedural cardiovascular examination: Secondary | ICD-10-CM

## 2021-07-19 NOTE — Progress Notes (Signed)
Virtual Visit via Telephone Note   Because of Nicole Schmidt's co-morbid illnesses, she is at least at moderate risk for complications without adequate follow up.  This format is felt to be most appropriate for this patient at this time.  The patient did not have access to video technology/had technical difficulties with video requiring transitioning to audio format only (telephone).  All issues noted in this document were discussed and addressed.  No physical exam could be performed with this format.  Please refer to the patient's chart for her consent to telehealth for [facility]  Evaluation Performed:  Preoperative cardiovascular risk assessment _____________   Date:  07/19/2021   Patient ID:  Nicole Schmidt, DOB December 15, 1965, MRN [id number] Patient Location:  Home Provider location:   Office  Primary Care Provider:  Lenard Simmer, MD Primary Cardiologist:  Kathlyn Sacramento, MD  Chief Complaint / Patient Profile   56 y.o. y/o female with a h/o breast cancer status post radiation, chronic HFpEF, permanent atrial fibrillation, hypertension, hyperlipidemia, type 2 diabetes mellitus, end-stage renal disease, anemia, obesity, hypothyroidism, pulmonary embolism, and hypothyroidism who is pending ARTERIOVENOUS (AV) FISTULA CREATION ([place])  and presents today for telephonic preoperative cardiovascular risk assessment.  Past Medical History    Past Medical History:  Diagnosis Date   (HFpEF) heart failure with preserved ejection fraction ([facility])    a.) 10/2018 Echo: EF 60-65%, diast dysfxn, RVSP 50.7 mmHg; b.) 01/2020 Echo: EF 65-70%, G2DD; c.) 01/2021 Echo: EF 60-65%, no rwma, mod LVH. Nl RV fxn, mildly enlarged RV. Mod BAE. Triv MR. d.) TTE 05/18/2021: EF 55-60; no RWMAs; mild LVH; mild TR.   Acquired hypothyroidism 02/09/2020   Anemia    Aortic atherosclerosis (HCC)    Arthritis    Breast cancer of upper-inner quadrant of right female breast ([facility])    Right breast invasive CA  and DCIS , 7 mm T1,N0,M0. Er/PR pos, her 2 negative.  Margins 1 mm.   COPD (chronic obstructive pulmonary disease) (HCC)    Diabetes mellitus type 2 in obese ([place]) 02/09/2020   Dyslipidemia 02/09/2020   ESRD (end stage renal disease) on dialysis [facility])    a.) T-Th-Sat HD initiated 02/2021   Essential hypertension    Gout    History of kidney stones    Hypothyroidism    Menopause    age 74   Morbid obesity ([facility])    OSA treated with BiPAP    a.) on nocturnal PAP therapy; Trilogy 100   Permanent atrial fibrillation ([facility])    a.) Dx 10/2018 in setting of PNA. b.) CHA2DS2-VASc = 6 (sex, HTN, PE x2, aortic plaque, T2DM); c.) s/p successful DCCV 11/2018. d.) subsequent recurrent Afib; rate/rhythm maintained on oral metoprolol succinate; chronically anticoagulated using standard dose apixaban.   Personal history of radiation therapy    Pulmonary embolism ([place]) 10/2014   a.) on chronic apixaban   Thyroid goiter    Past Surgical History:  Procedure Laterality Date   BREAST BIOPSY Right 2014   breast ca   BREAST EXCISIONAL BIOPSY Right 07/19/2012   breast ca   BREAST SURGERY Right 2014   with sentinel node bx subareaolar duct excision   CARDIOVERSION N/A 11/28/2018   Procedure: CARDIOVERSION;  Surgeon: Minna Merritts, MD;  Location: [facility] ORS;  Service: Cardiovascular;  Laterality: N/A;   COLONOSCOPY WITH PROPOFOL N/A 01/30/2017   Procedure: COLONOSCOPY WITH PROPOFOL;  Surgeon: Christene Lye, MD;  Location: [facility] ENDOSCOPY;  Service: Endoscopy;  Laterality: N/A;  CYSTOSCOPY W/ RETROGRADES  06/17/2021   Procedure: CYSTOSCOPY WITH RETROGRADE PYELOGRAM;  Surgeon: Billey Co, MD;  Location: [facility] ORS;  Service: Urology;;   CYSTOSCOPY W/ URETERAL STENT PLACEMENT Left 02/14/2021   Procedure: CYSTOSCOPY WITH RETROGRADE PYELOGRAM/URETERAL STENT PLACEMENT;  Surgeon: Billey Co, MD;  Location: [facility] ORS;  Service: Urology;  Laterality: Left;   CYSTOSCOPY/URETEROSCOPY/HOLMIUM  LASER/STENT PLACEMENT Left 06/17/2021   Procedure: CYSTOSCOPY/URETEROSCOPY/HOLMIUM LASER/STENT EXCHANGE;  Surgeon: Billey Co, MD;  Location: [facility] ORS;  Service: Urology;  Laterality: Left;   DIALYSIS/PERMA CATHETER INSERTION N/A 02/24/2021   Procedure: DIALYSIS/PERMA CATHETER INSERTION;  Surgeon: Algernon Huxley, MD;  Location: [facility];  Service: Cardiovascular;  Laterality: N/A;    Allergies  Allergies  Allergen Reactions   Spironolactone     Hyperkalemia  Pt unaware of this allergy    History of Present Illness    [name] is a 56 y.o. female who presents via audio/video conferencing for a telehealth visit today.  Pt was last seen in cardiology clinic on 04/27/21 by Murray Hodgkins, NP.  At that time SHREENA BAINES was doing well.  The patient is now pending procedure as outlined above. Since her last visit, she well. She has stable dyspnea. She reports feeling much better than prior. No chest pain, palpitations, dizziness, orthopnea, PND or syncope.    Home Medications    Prior to Admission medications   Medication Sig Start Date End Date Taking? Authorizing Provider  acetaminophen (TYLENOL) 325 MG tablet Take 2 tablets (650 mg total) by mouth every 6 (six) hours as needed for mild pain (or Fever >/= 101). 03/15/21   Angiulli, Lavon Paganini, PA-C  ADVAIR DISKUS 250-50 MCG/ACT AEPB Inhale 1 puff into the lungs 2 (two) times daily. 04/20/21   [provider]  albuterol (VENTOLIN HFA) 108 (90 Base) MCG/ACT inhaler Inhale 2 puffs into the lungs every 6 (six) hours as needed for wheezing or shortness of breath. 04/12/21   Angiulli, Lavon Paganini, PA-C  amLODipine (NORVASC) 5 MG tablet Take 1 tablet (5 mg total) by mouth daily. Patient taking differently: Take 5 mg by mouth every morning. 04/29/21   Theora Gianotti, NP  apixaban (ELIQUIS) 5 MG TABS tablet Take 1 tablet (5 mg total) by mouth 2 (two) times daily. 04/29/21   Theora Gianotti, NP  atorvastatin  (LIPITOR) 20 MG tablet Take 1 tablet (20 mg total) by mouth at bedtime. 04/29/21   Theora Gianotti, NP  calcitRIOL (ROCALTROL) 0.25 MCG capsule Take 1 capsule (0.25 mcg total) by mouth daily. 04/12/21   Angiulli, Lavon Paganini, PA-C  calcium carbonate (OS-CAL - DOSED IN MG OF ELEMENTAL CALCIUM) 1250 (500 Ca) MG tablet Take 1 tablet (1,250 mg total) by mouth daily with breakfast. 04/12/21   Angiulli, Lavon Paganini, PA-C  cloNIDine (CATAPRES) 0.2 MG tablet Take 0.2 mg by mouth 2 (two) times daily. 05/17/21   [provider]  diclofenac Sodium (VOLTAREN) 1 % GEL Apply 2 g topically 4 (four) times daily. Patient taking differently: Apply 2 g topically 4 (four) times daily as needed (pain). 04/12/21   Angiulli, Lavon Paganini, PA-C  ergocalciferol (VITAMIN D2) 1.25 MG (50000 UT) capsule Take 1 capsule (50,000 Units total) by mouth once a week. Patient taking differently: Take 50,000 Units by mouth every Friday. 04/12/21   Angiulli, Lavon Paganini, PA-C  famotidine (PEPCID) 10 MG tablet Take 1 tablet (10 mg total) by mouth daily. Patient not taking: Reported on 06/09/2021 04/12/21  Cathlyn Parsons, PA-C  ferric gluconate 250 mg in sodium chloride 0.9 % 250 mL Inject 250 mg into the vein Every Tuesday,Thursday,and Saturday with dialysis. 04/12/21   Angiulli, Lavon Paganini, PA-C  folic acid (FOLVITE) 1 MG tablet Take 1 tablet (1 mg total) by mouth daily. 04/12/21   Angiulli, Lavon Paganini, PA-C  gabapentin (NEURONTIN) 300 MG capsule Take 1 capsule (300 mg total) by mouth at bedtime. 04/12/21   Angiulli, Lavon Paganini, PA-C  Lancets (ONETOUCH DELICA PLUS [id number]) [facility]  05/17/21   [provider]  levothyroxine (SYNTHROID) 112 MCG tablet Take 1 tablet (112 mcg total) by mouth daily before breakfast. 04/12/21   Angiulli, Lavon Paganini, PA-C  melatonin 5 MG TABS Take 2 tablets (10 mg total) by mouth at bedtime as needed (sleep). Patient taking differently: Take 10 mg by mouth at bedtime. 04/12/21   Angiulli, Lavon Paganini, PA-C  metoprolol  succinate (TOPROL-XL) 25 MG 24 hr tablet Take 1 tablet (25 mg total) by mouth daily. Take with or immediately following a meal. Patient taking differently: Take 25 mg by mouth every morning. Take with or immediately following a meal. 04/29/21   Theora Gianotti, NP  midodrine (PROAMATINE) 10 MG tablet Take 1 tablet (10 mg total) by mouth Every Tuesday,Thursday,and Saturday with dialysis. Patient taking differently: Take 10 mg by mouth Every Tuesday,Thursday,and Saturday with dialysis. ONLY TAKES AS NEEDED FOR LOW BLOOD PRESSURE 04/12/21   Angiulli, Lavon Paganini, PA-C  Multiple Vitamins-Minerals (CERTAVITE/ANTIOXIDANTS) TABS Take 1 tablet by mouth at bedtime. 03/28/21   Fritzi Mandes, MD  [facility] ULTRA test strip  05/17/21   [provider]  senna (SENOKOT) 8.6 MG tablet Take 1 tablet by mouth as needed for constipation.    [provider]  sevelamer carbonate (RENVELA) 800 MG tablet Take 1 tablet (800 mg total) by mouth 3 (three) times daily with meals. Patient taking differently: Take 1,600 mg by mouth 3 (three) times daily with meals. 04/12/21   Angiulli, Lavon Paganini, PA-C  traMADol (ULTRAM) 50 MG tablet Take 1 tablet (50 mg total) by mouth every 12 (twelve) hours as needed for moderate pain. 04/12/21   Angiulli, Lavon Paganini, PA-C  TRULICITY 1.5 IP/3.8SN SOPN Inject 1.5 mg into the skin every Monday. 05/17/21   [provider]    Physical Exam    Vital Signs:  MARIACELESTE HERRERA does not have vital signs available for review today.  Given telephonic nature of communication, physical exam is limited. AAOx3. NAD. Normal affect.  Speech and respirations are unlabored.  Accessory Clinical Findings    None  Assessment & Plan    1.  Preoperative Cardiovascular Risk Assessment: Given past medical history and time since last visit, based on ACC/AHA guidelines, VELEDA MUN would be at acceptable risk for the planned procedure without further cardiovascular testing.   The patient  was advised that if she develops new symptoms prior to surgery to contact our office to arrange for a follow-up visit, and she verbalized understanding.  "Per office protocol, patient can hold Eliquis for 2 days prior to procedure."  However, patient reported that per preop package she was instructed to hold for 3 days.  She started to holding Eliquis since this morning for procedure on Friday.  I have instructed to continue to hold Eliquis since she has already did not took Eliquis this morning.    A copy of this note will be routed to requesting surgeon.  Time:   Today, I have spent 8  minutes with the patient with telehealth technology discussing medical history, symptoms, and management plan.     [place], [place]  07/19/2021, 1:10 PM

## 2021-07-20 ENCOUNTER — Encounter: Payer: Self-pay | Admitting: Vascular Surgery

## 2021-07-20 NOTE — Progress Notes (Signed)
Perioperative Services  Pre-Admission/Anesthesia Testing Clinical Review  Date: 07/20/21  Patient Demographics:  Name: Nicole Schmidt DOB:   [birth year] MRN:   [id number]  Planned Surgical Procedure(s):    Case: [id number] Date/Time: 07/22/21 0715   Procedure: ARTERIOVENOUS (AV) FISTULA CREATION ([place]) (Left)   Anesthesia type: General   Pre-op diagnosis: ESRD   Location: [facility] OR ROOM 08 / [place] ORS FOR ANESTHESIA GROUP   Surgeons: Katha Cabal, MD   NOTE: Available PAT nursing documentation and vital signs have been reviewed. Clinical nursing staff has updated patient's PMH/PSHx, current medication list, and drug allergies/intolerances to ensure comprehensive history available to assist in medical decision making as it pertains to the aforementioned surgical procedure and anticipated anesthetic course. Extensive review of available clinical information performed. [place] [facility] and PSHx updated with any diagnoses/procedures that  may have been inadvertently omitted during her intake with the pre-admission testing department's nursing staff.  Clinical Discussion:  Nicole Schmidt is a 56 y.o. female who is submitted for pre-surgical anesthesia review and clearance prior to her undergoing the above procedure. Patient has never been a smoker. Pertinent PMH includes: atrial fibrillation, HFpEF, aortic atherosclerosis, PE, HTN, HLD, T2DM, hypothyroidism, ESRD on dialysis, COPD, OSAH (requires nocturnal PAP therapy), anemia, OA.  Patient is followed by cardiology Fletcher Anon, MD). She was last seen in the cardiology clinic on 04/27/2021; notes reviewed.  At the time of her clinic visit, patient reporting chronic exertional dyspnea, however noted that symptom was at baseline.  She denied any episodes of chest pain, PND, orthopnea, palpitations, significant peripheral edema, vertiginous symptoms, or presyncope/syncope.  Patient with a past medical history significant for cardiovascular  diagnoses.  Patient underwent DCCV procedure in 11/2018 for her atrial fibrillation.  Procedure restored NSR, however since that time, patient has developed recurrent atrial arrhythmia.  Last TTE was performed on 05/18/2021 revealing a normal left ventricular systolic function with mild LVH; LVEF 55-60%.  RVSF normal.  There is no evidence of significant valvular regurgitation or transvalvular gradient suggestive of stenosis.     Again, patient has an atrial fibrillation diagnosis; CHA2DS2-VASc Score = 7 (sex, HFpEF, HTN, PE x 2, aortic plaque, T2DM).  Rate and rhythm currently maintained on oral metoprolol succinate.  She is chronically anticoagulated using standard dose apixaban; compliant with therapy with no evidence or reports of GI bleeding.  Patient is on a statin for her HLD diagnosis and further ASCVD prevention. T2DM reasonably controlled on currently prescribed regimen; last HgbA1c was 7.2% when checked on 02/09/2021.  Patient has an OSAH diagnosis for which she uses nocturnal BiPAP therapy; compliant with prescribed regimen with noted improvement.  Patient continued on hemodialysis with a Tuesday, Thursday, Saturday schedule. Functional capacity limited by obesity and multiple medical comorbidities.  Patient not felt to be able to achieve 4 METS of activity without angina/anginal equivalent symptoms.  No changes were made to her medication regimen.  Patient to follow-up with outpatient cardiology in 3 months or sooner if needed.  Nicole Schmidt is scheduled for a LEFT RADIOCEPHALIC ARTERIOVENOUS (AV) FISTULA CREATION on 07/22/2021 with Dr. [name], MD. Given patient's past medical history significant for cardiovascular diagnoses, presurgical cardiac clearance was sought by the PAT team. Per cardiology, "based ACC/AHA guidelines, the patient's past medical history, and the amount of time since her last clinic visit, this patient would be at an overall ACCEPTABLE risk for the planned  procedure without further cardiovascular testing or intervention at this time". Again, this patient is on daily  anticoagulation therapy.  She has been instructed on recommendations for holding her apixaban for 3 days prior to her procedure with plans to restart since postoperative bleeding risk felt to be minimized by her primary attending surgeon.  The patient is aware that her last dose of apixaban should be on 07/18/2021.  Patient denies previous perioperative complications with anesthesia in the past. In review of the available records, it is noted that patient underwent a general anesthetic course here at [facility] ([facility]) in 06/2021 without documented complications.      06/17/2021    4:11 PM 06/17/2021    3:58 PM 06/17/2021    3:45 PM  Vitals with BMI  Systolic 371 062 93  Diastolic 67 52 80  Pulse 77 79 80    Providers/Specialists:   NOTE: Primary physician provider listed below. Patient may have been seen by APP or partner within same practice.   PROVIDER ROLE / SPECIALTY LAST OV  Schnier, Dolores Lory, MD Vascular Surgery (Surgeon) 06/17/2021  Lenard Simmer, MD Primary Care Provider ???  Kathlyn Sacramento, MD Cardiology 04/27/2021  Chesley Mires, MD Pulmonary Medicine 05/17/2021   Allergies:  Spironolactone  Current Home Medications:   No current facility-administered medications for this encounter.    acetaminophen (TYLENOL) 325 MG tablet   ADVAIR DISKUS 250-50 MCG/ACT AEPB   albuterol (VENTOLIN HFA) 108 (90 Base) MCG/ACT inhaler   amLODipine (NORVASC) 5 MG tablet   apixaban (ELIQUIS) 5 MG TABS tablet   atorvastatin (LIPITOR) 20 MG tablet   calcitRIOL (ROCALTROL) 0.25 MCG capsule   calcium carbonate (OS-CAL - DOSED IN MG OF ELEMENTAL CALCIUM) 1250 (500 Ca) MG tablet   cloNIDine (CATAPRES) 0.2 MG tablet   diclofenac Sodium (VOLTAREN) 1 % GEL   ergocalciferol (VITAMIN D2) 1.25 MG (50000 UT) capsule   famotidine (PEPCID) 10 MG tablet    ferric gluconate 250 mg in sodium chloride 0.9 % 694 mL   folic acid (FOLVITE) 1 MG tablet   gabapentin (NEURONTIN) 300 MG capsule   Lancets (ONETOUCH DELICA PLUS [id number]) MISC   levothyroxine (SYNTHROID) 112 MCG tablet   melatonin 5 MG TABS   metoprolol succinate (TOPROL-XL) 25 MG 24 hr tablet   midodrine (PROAMATINE) 10 MG tablet   Multiple Vitamins-Minerals (CERTAVITE/ANTIOXIDANTS) TABS   ONETOUCH ULTRA test strip   senna (SENOKOT) 8.6 MG tablet   sevelamer carbonate (RENVELA) 800 MG tablet   traMADol (ULTRAM) 50 MG tablet   TRULICITY 1.5 KK/9.3GH SOPN   History:   Past Medical History:  Diagnosis Date   (HFpEF) heart failure with preserved ejection fraction ([place])    a.) 10/2018 Echo: EF 60-65%, diast dysfxn, RVSP 50.7 mmHg; b.) 01/2020 Echo: EF 65-70%, G2DD; c.) 01/2021 Echo: EF 60-65%, no rwma, mod LVH. Nl RV fxn, mildly enlarged RV. Mod BAE. Triv MR. d.) TTE 05/18/2021: EF 55-60; no RWMAs; mild LVH; mild TR.   Acquired hypothyroidism 02/09/2020   Anemia    Aortic atherosclerosis (HCC)    Arthritis    Breast cancer of upper-inner quadrant of right female breast ([place])    Right breast invasive CA and DCIS , 7 mm T1,N0,M0. Er/PR pos, her 2 negative.  Margins 1 mm.   COPD (chronic obstructive pulmonary disease) (HCC)    Diabetes mellitus type 2 in obese ([place]) 02/09/2020   Dyslipidemia 02/09/2020   ESBL (extended spectrum beta-lactamase) producing bacteria infection 03/20/2021   a.) urine culture (+) for ESBL producing E.coli and P. mirabilis   ESRD (end  stage renal disease) on dialysis [facility])    a.) T-Th-Sat HD initiated 02/2021   Essential hypertension    Gout    History of kidney stones    Menopause    age 43   Morbid obesity ([place])    OSA treated with BiPAP    a.) on nocturnal PAP therapy; Trilogy 100   Permanent atrial fibrillation ([place])    a.) Dx 10/2018 in setting of PNA. b.) CHA2DS2-VASc = 7 (sex, HFpEF, HTN, PE x2, aortic plaque, T2DM); c.) s/p successful DCCV  11/2018. d.) subsequent recurrent Afib; rate/rhythm maintained on oral metoprolol succinate; chronically anticoagulated using standard dose apixaban.   Personal history of radiation therapy    Pulmonary embolism ([name]) 10/2014   a.) on chronic apixaban   Thyroid goiter    Past Surgical History:  Procedure Laterality Date   BREAST BIOPSY Right 2014   breast ca   BREAST EXCISIONAL BIOPSY Right 07/19/2012   breast ca   BREAST SURGERY Right 2014   with sentinel node bx subareaolar duct excision   CARDIOVERSION N/A 11/28/2018   Procedure: CARDIOVERSION;  Surgeon: Minna Merritts, MD;  Location: [facility] ORS;  Service: Cardiovascular;  Laterality: N/A;   COLONOSCOPY WITH PROPOFOL N/A 01/30/2017   Procedure: COLONOSCOPY WITH PROPOFOL;  Surgeon: Christene Lye, MD;  Location: [facility] ENDOSCOPY;  Service: Endoscopy;  Laterality: N/A;   CYSTOSCOPY W/ RETROGRADES  06/17/2021   Procedure: CYSTOSCOPY WITH RETROGRADE PYELOGRAM;  Surgeon: Billey Co, MD;  Location: [facility] ORS;  Service: Urology;;   CYSTOSCOPY W/ URETERAL STENT PLACEMENT Left 02/14/2021   Procedure: CYSTOSCOPY WITH RETROGRADE PYELOGRAM/URETERAL STENT PLACEMENT;  Surgeon: Billey Co, MD;  Location: [facility] ORS;  Service: Urology;  Laterality: Left;   CYSTOSCOPY/URETEROSCOPY/HOLMIUM LASER/STENT PLACEMENT Left 06/17/2021   Procedure: CYSTOSCOPY/URETEROSCOPY/HOLMIUM LASER/STENT EXCHANGE;  Surgeon: Billey Co, MD;  Location: [facility] ORS;  Service: Urology;  Laterality: Left;   DIALYSIS/PERMA CATHETER INSERTION N/A 02/24/2021   Procedure: DIALYSIS/PERMA CATHETER INSERTION;  Surgeon: Algernon Huxley, MD;  Location: [facility];  Service: Cardiovascular;  Laterality: N/A;   Family History  Problem Relation Age of Onset   Diabetes Father    Hypertension Father    Parkinson's disease Father    Hypertension Mother    Rheum arthritis Mother    Atrial fibrillation Mother    Heart failure Mother    Heart attack Mother        died  in her mid-70's.   Colon cancer Cousin 72   Breast cancer Neg Hx    Social History   Tobacco Use   Smoking status: Never    Passive exposure: Never   Smokeless tobacco: Never  Vaping Use   Vaping Use: Never used  Substance Use Topics   Alcohol use: No    Alcohol/week: 0.0 standard drinks   Drug use: No    Pertinent Clinical Results:  LABS: Labs reviewed: Acceptable for surgery.  Lab Results  Component Value Date   WBC 8.8 06/15/2021   HGB 14.3 06/17/2021   HCT 42.0 06/17/2021   MCV 95.3 06/15/2021   PLT 192 06/15/2021   Lab Results  Component Value Date   NA 131 (L) 06/17/2021   K 4.9 06/17/2021   CO2 27 06/15/2021   GLUCOSE 127 (H) 06/17/2021   BUN 42 (H) 06/17/2021   CREATININE 6.90 (H) 06/17/2021   CALCIUM 9.1 06/15/2021   GFRNONAA 8 (L) 06/15/2021     ECG: Date: 06/15/2021 Time ECG obtained: 1245 PM Rate: 78  bpm Rhythm: atrial fibrillation Axis (leads I and aVF): Normal Intervals: QRS 86 ms. QTc 437 ms. ST segment and T wave changes: No evidence of acute ST segment elevation or depression Comparison: Similar to previous tracing obtained on 04/27/2021   IMAGING / PROCEDURES: TRANSTHORACIC ECHOCARDIOGRAM performed on 05/18/2021 Left ventricular ejection fraction, by estimation, is 55 to 60%. The left ventricle has normal function. The left ventricle has no regional wall motion abnormalities. There is mild left ventricular hypertrophy.  Right ventricular systolic function is normal.  The mitral valve is normal in structure. No evidence of mitral valve regurgitation.  The aortic valve is tricuspid. No aortic stenosis is present.   Impression and Plan:  Nicole Schmidt has been referred for pre-anesthesia review and clearance prior to her undergoing the planned anesthetic and procedural courses. Available labs, pertinent testing, and imaging results were personally reviewed by me. This patient has been appropriately cleared by cardiology with an overall  ACCEPTABLE risk of significant perioperative cardiovascular complications.  Based on clinical review performed today (07/20/21), barring any significant acute changes in the patient's overall condition, it is anticipated that she will be able to proceed with the planned surgical intervention. Any acute changes in clinical condition may necessitate her procedure being postponed and/or cancelled. Patient will meet with anesthesia team (MD and/or CRNA) on the day of her procedure for preoperative evaluation/assessment. Questions regarding anesthetic course will be fielded at that time.   Pre-surgical instructions were reviewed with the patient during her PAT appointment and questions were fielded by PAT clinical staff. Patient was advised that if any questions or concerns arise prior to her procedure then she should return a call to PAT and/or her surgeon's office to discuss.  Honor Loh, MSN, APRN, FNP-C, CEN [facility]  Peri-operative Services Nurse Practitioner Phone: [phone] Fax: [phone] 07/20/21 9:09 AM  NOTE: This note has been prepared using Dragon dictation software. Despite my best ability to proofread, there is always the potential that unintentional transcriptional errors may still occur from this process.

## 2021-07-22 ENCOUNTER — Ambulatory Visit: Payer: BC Managed Care – PPO | Admitting: Urgent Care

## 2021-07-22 ENCOUNTER — Encounter: Payer: Self-pay | Admitting: Vascular Surgery

## 2021-07-22 ENCOUNTER — Other Ambulatory Visit: Payer: Self-pay

## 2021-07-22 ENCOUNTER — Ambulatory Visit
Admission: RE | Admit: 2021-07-22 | Discharge: 2021-07-22 | Disposition: A | Discharge status: Home / Self Care | Payer: BC Managed Care – PPO | Source: Ambulatory Visit | Attending: Vascular Surgery | Admitting: Vascular Surgery

## 2021-07-22 ENCOUNTER — Encounter
Admission: RE | Disposition: A | Discharge status: Home / Self Care | Payer: Self-pay | Source: Ambulatory Visit | Attending: Vascular Surgery

## 2021-07-22 DIAGNOSIS — E785 Hyperlipidemia, unspecified: Secondary | ICD-10-CM | POA: Insufficient documentation

## 2021-07-22 DIAGNOSIS — N186 End stage renal disease: Secondary | ICD-10-CM | POA: Diagnosis not present

## 2021-07-22 DIAGNOSIS — Z992 Dependence on renal dialysis: Secondary | ICD-10-CM | POA: Insufficient documentation

## 2021-07-22 DIAGNOSIS — Z01818 Encounter for other preprocedural examination: Secondary | ICD-10-CM

## 2021-07-22 DIAGNOSIS — E1122 Type 2 diabetes mellitus with diabetic chronic kidney disease: Secondary | ICD-10-CM | POA: Diagnosis not present

## 2021-07-22 DIAGNOSIS — I5032 Chronic diastolic (congestive) heart failure: Secondary | ICD-10-CM | POA: Insufficient documentation

## 2021-07-22 DIAGNOSIS — E1165 Type 2 diabetes mellitus with hyperglycemia: Secondary | ICD-10-CM | POA: Diagnosis not present

## 2021-07-22 DIAGNOSIS — I132 Hypertensive heart and chronic kidney disease with heart failure and with stage 5 chronic kidney disease, or end stage renal disease: Secondary | ICD-10-CM | POA: Diagnosis not present

## 2021-07-22 DIAGNOSIS — I509 Heart failure, unspecified: Secondary | ICD-10-CM | POA: Diagnosis not present

## 2021-07-22 HISTORY — DX: Atherosclerosis of aorta: I70.0

## 2021-07-22 HISTORY — DX: Dependence on renal dialysis: Z99.2

## 2021-07-22 HISTORY — DX: End stage renal disease: N18.6

## 2021-07-22 HISTORY — DX: Obstructive sleep apnea (adult) (pediatric): G47.33

## 2021-07-22 HISTORY — PX: AV FISTULA PLACEMENT: SHX1204

## 2021-07-22 LAB — POCT I-STAT, CHEM 8
BUN: 32 mg/dL — ABNORMAL HIGH (ref 6–20)
Calcium, Ion: 1.08 mmol/L — ABNORMAL LOW (ref 1.15–1.40)
Chloride: 91 mmol/L — ABNORMAL LOW (ref 98–111)
Creatinine, Ser: 5.2 mg/dL — ABNORMAL HIGH (ref 0.44–1.00)
Glucose, Bld: 130 mg/dL — ABNORMAL HIGH (ref 70–99)
HCT: 41 % (ref 36.0–46.0)
Hemoglobin: 13.9 g/dL (ref 12.0–15.0)
Potassium: 3.4 mmol/L — ABNORMAL LOW (ref 3.5–5.1)
Sodium: 133 mmol/L — ABNORMAL LOW (ref 135–145)
TCO2: 29 mmol/L (ref 22–32)

## 2021-07-22 LAB — GLUCOSE, CAPILLARY: Glucose-Capillary: 148 mg/dL — ABNORMAL HIGH (ref 70–99)

## 2021-07-22 SURGERY — ARTERIOVENOUS (AV) FISTULA CREATION
Anesthesia: General | Site: Arm Lower | Laterality: Left

## 2021-07-22 MED ORDER — ONDANSETRON HCL 4 MG/2ML IJ SOLN
INTRAMUSCULAR | Status: DC | PRN
  Administered 2021-07-22: 4 mg via INTRAVENOUS

## 2021-07-22 MED ORDER — SODIUM CHLORIDE 0.9 % IV SOLN: INTRAVENOUS | Status: DC

## 2021-07-22 MED ORDER — FAMOTIDINE 20 MG PO TABS
ORAL_TABLET | ORAL | Status: AC
  Filled 2021-07-22: qty 1

## 2021-07-22 MED ORDER — HEMOSTATIC AGENTS (NO CHARGE) OPTIME
TOPICAL | Status: DC | PRN
  Administered 2021-07-22: 1 via TOPICAL

## 2021-07-22 MED ORDER — BUPIVACAINE HCL (PF) 0.5 % IJ SOLN
INTRAMUSCULAR | Status: AC
  Filled 2021-07-22: qty 30

## 2021-07-22 MED ORDER — CHLORHEXIDINE GLUCONATE 0.12 % MT SOLN
15.0000 mL | Freq: Once | OROMUCOSAL | Status: AC
  Administered 2021-07-22: 15 mL via OROMUCOSAL

## 2021-07-22 MED ORDER — ACETAMINOPHEN 10 MG/ML IV SOLN
INTRAVENOUS | Status: DC | PRN
  Administered 2021-07-22: 1000 mg via INTRAVENOUS

## 2021-07-22 MED ORDER — PHENYLEPHRINE HCL (PRESSORS) 10 MG/ML IV SOLN
INTRAVENOUS | Status: DC | PRN
  Administered 2021-07-22 (×5): 80 ug via INTRAVENOUS

## 2021-07-22 MED ORDER — CEFAZOLIN SODIUM-DEXTROSE 2-4 GM/100ML-% IV SOLN
2.0000 g | INTRAVENOUS | Status: AC
  Administered 2021-07-22: 3 g via INTRAVENOUS

## 2021-07-22 MED ORDER — CEFAZOLIN SODIUM-DEXTROSE 2-4 GM/100ML-% IV SOLN
INTRAVENOUS | Status: AC
  Filled 2021-07-22: qty 100

## 2021-07-22 MED ORDER — BUPIVACAINE LIPOSOME 1.3 % IJ SUSP
INTRAMUSCULAR | Status: AC
  Filled 2021-07-22: qty 10

## 2021-07-22 MED ORDER — BUPIVACAINE LIPOSOME 1.3 % IJ SUSP
INTRAMUSCULAR | Status: AC
  Filled 2021-07-22: qty 20

## 2021-07-22 MED ORDER — FENTANYL CITRATE (PF) 100 MCG/2ML IJ SOLN
INTRAMUSCULAR | Status: DC | PRN
  Administered 2021-07-22: 100 ug via INTRAVENOUS

## 2021-07-22 MED ORDER — MIDAZOLAM HCL 2 MG/2ML IJ SOLN
INTRAMUSCULAR | Status: AC
  Filled 2021-07-22: qty 2

## 2021-07-22 MED ORDER — PHENYLEPHRINE HCL-NACL 20-0.9 MG/250ML-% IV SOLN
INTRAVENOUS | Status: DC | PRN
  Administered 2021-07-22: 20 ug/min via INTRAVENOUS

## 2021-07-22 MED ORDER — FAMOTIDINE 20 MG PO TABS
20.0000 mg | ORAL_TABLET | Freq: Once | ORAL | Status: AC
  Administered 2021-07-22: 20 mg via ORAL

## 2021-07-22 MED ORDER — BUPIVACAINE LIPOSOME 1.3 % IJ SUSP
INTRAMUSCULAR | Status: DC | PRN
  Administered 2021-07-22: 20 mL via INTRAMUSCULAR

## 2021-07-22 MED ORDER — SODIUM CHLORIDE 0.9 % IR SOLN
Status: DC | PRN
  Administered 2021-07-22: 501 mL

## 2021-07-22 MED ORDER — SUCCINYLCHOLINE CHLORIDE 200 MG/10ML IV SOSY
PREFILLED_SYRINGE | INTRAVENOUS | Status: DC | PRN
  Administered 2021-07-22: 200 mg via INTRAVENOUS

## 2021-07-22 MED ORDER — HYDROCODONE-ACETAMINOPHEN 5-325 MG PO TABS: 1.0000 | ORAL_TABLET | Freq: Four times a day (QID) | ORAL | 0 refills | Status: DC | PRN

## 2021-07-22 MED ORDER — PROPOFOL 10 MG/ML IV BOLUS
INTRAVENOUS | Status: AC
  Filled 2021-07-22: qty 40

## 2021-07-22 MED ORDER — CHLORHEXIDINE GLUCONATE 0.12 % MT SOLN
OROMUCOSAL | Status: AC
  Filled 2021-07-22: qty 15

## 2021-07-22 MED ORDER — CHLORHEXIDINE GLUCONATE CLOTH 2 % EX PADS: 6.0000 | MEDICATED_PAD | Freq: Once | CUTANEOUS | Status: DC

## 2021-07-22 MED ORDER — PROPOFOL 10 MG/ML IV BOLUS
INTRAVENOUS | Status: DC | PRN
  Administered 2021-07-22: 20 mg via INTRAVENOUS
  Administered 2021-07-22: 200 mg via INTRAVENOUS

## 2021-07-22 MED ORDER — LIDOCAINE HCL (CARDIAC) PF 100 MG/5ML IV SOSY
PREFILLED_SYRINGE | INTRAVENOUS | Status: DC | PRN
  Administered 2021-07-22: 100 mg via INTRAVENOUS

## 2021-07-22 MED ORDER — FENTANYL CITRATE (PF) 100 MCG/2ML IJ SOLN
INTRAMUSCULAR | Status: AC
  Filled 2021-07-22: qty 2

## 2021-07-22 MED ORDER — PAPAVERINE HCL 30 MG/ML IJ SOLN
INTRAMUSCULAR | Status: AC
  Filled 2021-07-22: qty 2

## 2021-07-22 MED ORDER — DEXAMETHASONE SODIUM PHOSPHATE 10 MG/ML IJ SOLN
INTRAMUSCULAR | Status: DC | PRN
  Administered 2021-07-22: 10 mg via INTRAVENOUS

## 2021-07-22 MED ORDER — ORAL CARE MOUTH RINSE: 15.0000 mL | Freq: Once | OROMUCOSAL | Status: AC

## 2021-07-22 MED ORDER — HEPARIN SODIUM (PORCINE) 5000 UNIT/ML IJ SOLN
INTRAMUSCULAR | Status: AC
  Filled 2021-07-22: qty 1

## 2021-07-22 SURGICAL SUPPLY — 62 items
ADH SKN CLS APL DERMABOND .7 (GAUZE/BANDAGES/DRESSINGS) ×1
APL PRP FLT STRL LF DISP 70% (MISCELLANEOUS) ×2
APL PRP STRL LF DISP 70% ISPRP (MISCELLANEOUS) ×3
APPLICATOR CHLORAPREP 10 TEAL (MISCELLANEOUS) ×2 IMPLANT
APPLIER CLIP 11 MED OPEN (CLIP)
APPLIER CLIP 9.375 SM OPEN (CLIP)
APR CLP MED 11 20 MLT OPN (CLIP)
APR CLP SM 9.3 20 MLT OPN (CLIP)
BAG DECANTER FOR FLEXI CONT (MISCELLANEOUS) ×2 IMPLANT
BLADE SURG SZ11 CARB STEEL (BLADE) ×2 IMPLANT
BOOT SUTURE AID YELLOW STND (SUTURE) ×3 IMPLANT
BRUSH SCRUB EZ  4% CHG (MISCELLANEOUS) ×1
BRUSH SCRUB EZ 4% CHG (MISCELLANEOUS) ×1 IMPLANT
CHLORAPREP W/TINT 26 (MISCELLANEOUS) ×4 IMPLANT
CLIP APPLIE 11 MED OPEN (CLIP) IMPLANT
CLIP APPLIE 9.375 SM OPEN (CLIP) IMPLANT
CNTNR SPEC 2.5X3XGRAD LEK (MISCELLANEOUS) ×1
CONT SPEC 4OZ STER OR WHT (MISCELLANEOUS) ×1
CONT SPEC 4OZ STRL OR WHT (MISCELLANEOUS) ×1
CONTAINER SPEC 2.5X3XGRAD LEK (MISCELLANEOUS) IMPLANT
DERMABOND ADVANCED (GAUZE/BANDAGES/DRESSINGS) ×1
DERMABOND ADVANCED .7 DNX12 (GAUZE/BANDAGES/DRESSINGS) ×1 IMPLANT
DRESSING SURGICEL FIBRLLR 1X2 (HEMOSTASIS) ×1 IMPLANT
DRSG SURGICEL FIBRILLAR 1X2 (HEMOSTASIS) ×2
ELECT CAUTERY BLADE 6.4 (BLADE) ×2 IMPLANT
ELECT REM PT RETURN 9FT ADLT (ELECTROSURGICAL) ×2
ELECTRODE REM PT RTRN 9FT ADLT (ELECTROSURGICAL) ×1 IMPLANT
GLOVE SURG SYN 8.0 (GLOVE) ×2 IMPLANT
GLOVE SURG SYN 8.0 PF PI (GLOVE) ×1 IMPLANT
GOWN STRL REUS W/ TWL LRG LVL3 (GOWN DISPOSABLE) ×1 IMPLANT
GOWN STRL REUS W/ TWL XL LVL3 (GOWN DISPOSABLE) ×1 IMPLANT
GOWN STRL REUS W/TWL LRG LVL3 (GOWN DISPOSABLE) ×2
GOWN STRL REUS W/TWL XL LVL3 (GOWN DISPOSABLE) ×2
IV NS 500ML (IV SOLUTION) ×2
IV NS 500ML BAXH (IV SOLUTION) ×1 IMPLANT
KIT TURNOVER KIT A (KITS) ×2 IMPLANT
LABEL OR SOLS (LABEL) ×1 IMPLANT
LOOP RED MAXI  1X406MM (MISCELLANEOUS) ×2
LOOP VESSEL MAXI 1X406 RED (MISCELLANEOUS) ×1 IMPLANT
LOOP VESSEL MINI 0.8X406 BLUE (MISCELLANEOUS) ×2 IMPLANT
LOOPS BLUE MINI 0.8X406MM (MISCELLANEOUS) ×2
MANIFOLD NEPTUNE II (INSTRUMENTS) ×2 IMPLANT
NDL FILTER BLUNT 18X1 1/2 (NEEDLE) ×1 IMPLANT
NEEDLE FILTER BLUNT 18X 1/2SAF (NEEDLE) ×1
NEEDLE FILTER BLUNT 18X1 1/2 (NEEDLE) ×1 IMPLANT
PACK EXTREMITY ARMC (MISCELLANEOUS) ×2 IMPLANT
STOCKINETTE 48X4 2 PLY STRL (GAUZE/BANDAGES/DRESSINGS) ×1 IMPLANT
STOCKINETTE STRL 4IN 9604848 (GAUZE/BANDAGES/DRESSINGS) ×2 IMPLANT
SUT MNCRL+ 5-0 UNDYED PC-3 (SUTURE) ×1 IMPLANT
SUT MONOCRYL 5-0 (SUTURE) ×1
SUT PROLENE 6 0 BV (SUTURE) ×17 IMPLANT
SUT SILK 0 SH 30 (SUTURE) ×1 IMPLANT
SUT SILK 2 0 (SUTURE) ×2
SUT SILK 2-0 18XBRD TIE 12 (SUTURE) ×1 IMPLANT
SUT SILK 3 0 (SUTURE) ×2
SUT SILK 3-0 18XBRD TIE 12 (SUTURE) ×1 IMPLANT
SUT SILK 4 0 (SUTURE) ×2
SUT SILK 4-0 18XBRD TIE 12 (SUTURE) ×1 IMPLANT
SUT VIC AB 3-0 SH 27 (SUTURE) ×2
SUT VIC AB 3-0 SH 27X BRD (SUTURE) ×1 IMPLANT
SYR 20ML LL LF (SYRINGE) ×4 IMPLANT
SYR 3ML LL SCALE MARK (SYRINGE) ×2 IMPLANT

## 2021-07-22 NOTE — Discharge Instructions (Addendum)
AMBULATORY SURGERY  DISCHARGE INSTRUCTIONS   The drugs that you were given will stay in your system until tomorrow so for the next 24 hours you should not:  Drive an automobile Make any legal decisions Drink any alcoholic beverage   You may resume regular meals tomorrow.  Today it is better to start with liquids and gradually work up to solid foods.  You may eat anything you prefer, but it is better to start with liquids, then soup and crackers, and gradually work up to solid foods.   Please notify your doctor immediately if you have any unusual bleeding, trouble breathing, redness and pain at the surgery site, drainage, fever, or pain not relieved by medication.    Additional Instructions:   WEAR GREEN BRACELET FOR 4 DAYS         Please contact your physician with any problems or Same Day Surgery at [phone], Monday through Friday 6 am to 4 pm, or [place] at [facility] number at [phone].

## 2021-07-22 NOTE — Op Note (Signed)
OPERATIVE NOTE   PROCEDURE: left radiocephalic arteriovenous fistula placement  PRE-OPERATIVE DIAGNOSIS: End Stage Renal Disease  POST-OPERATIVE DIAGNOSIS: End Stage Renal Disease  SURGEON: [name]  ASSISTANT(S): None  ANESTHESIA: general  ESTIMATED BLOOD LOSS: <50 cc  FINDING(S): Small 2.0 to 2.5 mm cephalic vein.  Radial artery sounded to 2 mm  SPECIMEN(S):  none  INDICATIONS:   Nicole Schmidt is a 56 y.o. female who presents with end stage renal disease.  The patient is scheduled for left brachiocephalic arteriovenous fistula placement.  The patient is aware the risks include but are not limited to: bleeding, infection, steal syndrome, nerve damage, ischemic monomelic neuropathy, failure to mature, and need for additional procedures.  The patient is aware of the risks of the procedure and elects to proceed forward.  DESCRIPTION: After full informed written consent was obtained from the patient, the patient was brought back to the operating room and placed supine upon the operating table.  Prior to induction, the patient received IV antibiotics.   After obtaining adequate anesthesia, the patient was then prepped and draped in the standard fashion for a left arm access procedure.   A first assistant was required to provide a safe and appropriate environment for executing the surgery.  The assistant was integral in providing retraction, exposure, running suture providing suction and in the closing process.   A linear incision was then created midway between the radial impulse and the cephalic vein. The cephalic vein was then identified and dissected circumferentially. It was marked with a surgical marker.    Attention was then turned to the radial artery which was exposed through the same incision and looped proximally and distally. Side branches were controlled with 4-0 silk ties.  The distal segment of the vein was ligated with a  2-0 silk, and the vein was  transected.  The proximal segment was interrogated with serial dilators.  The vein accepted up to a 2 mm dilator without any difficulty a 2.5 mm dilator was a bit on the tight side and so I did not advance this much past the end of the vein. Heparinized saline was infused into the vein and clamped it with a small bulldog.  At this point, I reset my exposure of the brachial artery and controlled the artery with vessel loops proximally and distally.  An arteriotomy was then made with a #11 blade, and extended with a Potts scissor.  Heparinized saline was injected proximal and distal into the radial artery.  The vein was then approximated to the artery while the artery was in its native bed and subsequently the vein was beveled using Potts scissors. The vein was then sewn to the artery in an end-to-side configuration with interrupted stitches of 6-0 Prolene.  Prior to completing this anastomosis Flushing maneuvers were performed and the artery was allowed to forward and back bleed.  There was no evidence of clot from any vessels.  I completed the anastomosis in the usual fashion and then released all vessel loops and clamps.    There was good  thrill in the venous outflow, and there was 1+ palpable radial pulse.  At this point, I irrigated out the surgical wound.  There was no further active bleeding.  The subcutaneous tissue was reapproximated with a running stitch of 3-0 Vicryl.  The skin was then reapproximated with a running subcuticular stitch of 4-0 Vicryl.  The skin was then cleaned, dried, and reinforced with Dermabond.    The patient  tolerated this procedure well.   COMPLICATIONS: None  CONDITION: [facility]  Office: [phone]   07/22/2021, 10:11 AM

## 2021-07-22 NOTE — Anesthesia Postprocedure Evaluation (Signed)
Anesthesia Post Note  Patient: Nicole Schmidt  Procedure(s) Performed: ARTERIOVENOUS (AV) FISTULA CREATION (RADIALCEPHALIC) (Left: Arm Lower)  Patient location during evaluation: PACU Anesthesia Type: General Level of consciousness: awake and alert Pain management: pain level controlled Vital Signs Assessment: post-procedure vital signs reviewed and stable Respiratory status: spontaneous breathing, nonlabored ventilation, respiratory function stable and patient connected to nasal cannula oxygen Cardiovascular status: blood pressure returned to baseline and stable Postop Assessment: no apparent nausea or vomiting Anesthetic complications: no   No notable events documented.   Last Vitals:  Vitals:   07/22/21 1053 07/22/21 1122  BP: 124/65 (!) 125/55  Pulse: 81 87  Resp: 16 18  Temp: 36.6 C   SpO2: 98% 95%    Last Pain:  Vitals:   07/22/21 1122  TempSrc:   PainSc: 0-No pain                 Arita Miss

## 2021-07-22 NOTE — Progress Notes (Signed)
   07/22/21 0730  Clinical Encounter Type  Visited With Patient and family together  Visit Type Initial;Pre-op   Chaplain provided pre-op support.

## 2021-07-22 NOTE — Anesthesia Procedure Notes (Signed)
Procedure Name: Intubation Date/Time: 07/22/2021 7:35 AM  Performed by: Biagio Borg, CRNAPre-anesthesia Checklist: Patient identified, Emergency Drugs available, Suction available and Patient being monitored Patient Re-evaluated:Patient Re-evaluated prior to induction Oxygen Delivery Method: Circle system utilized Preoxygenation: Pre-oxygenation with 100% oxygen Induction Type: IV induction Ventilation: Mask ventilation without difficulty Laryngoscope Size: McGraph and 3 Grade View: Grade I Tube type: Oral Tube size: 7.0 mm Number of attempts: 1 Airway Equipment and Method: Stylet Placement Confirmation: ETT inserted through vocal cords under direct vision, positive ETCO2 and breath sounds checked- equal and bilateral Secured at: 21 cm Tube secured with: Tape Dental Injury: Teeth and Oropharynx as per pre-operative assessment

## 2021-07-22 NOTE — H&P (View-Only) (Signed)
MRN : [id number]  Nicole Schmidt is a 56 y.o. (04-28-1965) female who presents with chief complaint of check access.  History of Present Illness:   Current access is via a catheter which is functioning well .  This is the patient's first dialysis access.  There have not been multiple episodes of catheter infection.  The patient denies fever and chills while on dialysis.  No tenderness or drainage at the exit site.   No recent shortening of the patient's walking distance or new symptoms consistent with claudication.  No history of rest pain symptoms. No new ulcers or wounds of the lower extremities have occurred.   The patient is right-hand dominant.   The patient denies amaurosis fugax or recent TIA symptoms. There are no recent neurological changes noted. There is no history of DVT, PE or superficial thrombophlebitis. No recent episodes of angina or shortness of breath documented.    The patient has adequate access for a left radiocephalic AV fistula.    Current Meds  Medication Sig   ADVAIR DISKUS 250-50 MCG/ACT AEPB Inhale 1 puff into the lungs 2 (two) times daily.   albuterol (VENTOLIN HFA) 108 (90 Base) MCG/ACT inhaler Inhale 2 puffs into the lungs every 6 (six) hours as needed for wheezing or shortness of breath.   amLODipine (NORVASC) 5 MG tablet Take 1 tablet (5 mg total) by mouth daily. (Patient taking differently: Take 5 mg by mouth every morning.)   atorvastatin (LIPITOR) 20 MG tablet Take 1 tablet (20 mg total) by mouth at bedtime.   calcitRIOL (ROCALTROL) 0.25 MCG capsule Take 1 capsule (0.25 mcg total) by mouth daily.   calcium carbonate (OS-CAL - DOSED IN MG OF ELEMENTAL CALCIUM) 1250 (500 Ca) MG tablet Take 1 tablet (1,250 mg total) by mouth daily with breakfast.   cloNIDine (CATAPRES) 0.2 MG tablet Take 0.2 mg by mouth 2 (two) times daily.   diclofenac Sodium (VOLTAREN) 1 % GEL Apply 2 g topically 4 (four) times daily. (Patient taking differently: Apply 2 g topically 4  (four) times daily as needed (pain).)   ferric gluconate 250 mg in sodium chloride 0.9 % 250 mL Inject 250 mg into the vein Every Tuesday,Thursday,and Saturday with dialysis.   gabapentin (NEURONTIN) 300 MG capsule Take 1 capsule (300 mg total) by mouth at bedtime.   levothyroxine (SYNTHROID) 112 MCG tablet Take 1 tablet (112 mcg total) by mouth daily before breakfast.   metoprolol succinate (TOPROL-XL) 25 MG 24 hr tablet Take 1 tablet (25 mg total) by mouth daily. Take with or immediately following a meal. (Patient taking differently: Take 25 mg by mouth every morning. Take with or immediately following a meal.)   senna (SENOKOT) 8.6 MG tablet Take 1 tablet by mouth as needed for constipation.   sevelamer carbonate (RENVELA) 800 MG tablet Take 1 tablet (800 mg total) by mouth 3 (three) times daily with meals. (Patient taking differently: Take 1,600 mg by mouth 3 (three) times daily with meals.)   traMADol (ULTRAM) 50 MG tablet Take 1 tablet (50 mg total) by mouth every 12 (twelve) hours as needed for moderate pain.    Past Medical History:  Diagnosis Date   (HFpEF) heart failure with preserved ejection fraction ([place])    a.) 10/2018 Echo: EF 60-65%, diast dysfxn, RVSP 50.7 mmHg; b.) 01/2020 Echo: EF 65-70%, G2DD; c.) 01/2021 Echo: EF 60-65%, no rwma, mod LVH. Nl RV fxn, mildly enlarged RV. Mod BAE. Triv MR. d.) TTE 05/18/2021: EF 55-60; no RWMAs;  mild LVH; mild TR.   Acquired hypothyroidism 02/09/2020   Anemia    Aortic atherosclerosis (HCC)    Arthritis    Breast cancer of upper-inner quadrant of right female breast ([place])    Right breast invasive CA and DCIS , 7 mm T1,N0,M0. Er/PR pos, her 2 negative.  Margins 1 mm.   COPD (chronic obstructive pulmonary disease) (HCC)    Diabetes mellitus type 2 in obese ([place]) 02/09/2020   Dyslipidemia 02/09/2020   ESBL (extended spectrum beta-lactamase) producing bacteria infection 03/20/2021   a.) urine culture (+) for ESBL producing E.coli and P. mirabilis    ESRD (end stage renal disease) on dialysis [facility])    a.) T-Th-Sat HD initiated 02/2021   Essential hypertension    Gout    History of kidney stones    Menopause    age 3   Morbid obesity ([place])    OSA treated with BiPAP    a.) on nocturnal PAP therapy; Trilogy 100   Permanent atrial fibrillation ([place])    a.) Dx 10/2018 in setting of PNA. b.) CHA2DS2-VASc = 7 (sex, HFpEF, HTN, PE x2, aortic plaque, T2DM); c.) s/p successful DCCV 11/2018. d.) subsequent recurrent Afib; rate/rhythm maintained on oral metoprolol succinate; chronically anticoagulated using standard dose apixaban.   Personal history of radiation therapy    Pulmonary embolism ([place]) 10/2014   a.) on chronic apixaban   Thyroid goiter     Past Surgical History:  Procedure Laterality Date   BREAST BIOPSY Right 2014   breast ca   BREAST EXCISIONAL BIOPSY Right 07/19/2012   breast ca   BREAST SURGERY Right 2014   with sentinel node bx subareaolar duct excision   CARDIOVERSION N/A 11/28/2018   Procedure: CARDIOVERSION;  Surgeon: Minna Merritts, MD;  Location: [facility] ORS;  Service: Cardiovascular;  Laterality: N/A;   COLONOSCOPY WITH PROPOFOL N/A 01/30/2017   Procedure: COLONOSCOPY WITH PROPOFOL;  Surgeon: Christene Lye, MD;  Location: [facility] ENDOS[facility];  Service: Endoscopy;  Laterality: N/A;   CYSTOSCOPY W/ RETROGRADES  06/17/2021   Procedure: CYSTOSCOPY WITH RETROGRADE PYELOGRAM;  Surgeon: Billey Co, MD;  Location: [facility] ORS;  Service: Urology;;   CYSTOSCOPY W/ URETERAL STENT PLACEMENT Left 02/14/2021   Procedure: CYSTOSCOPY WITH RETROGRADE PYELOGRAM/URETERAL STENT PLACEMENT;  Surgeon: Billey Co, MD;  Location: [facility] ORS;  Service: Urology;  Laterality: Left;   CYSTOSCOPY/URETEROSCOPY/HOLMIUM LASER/STENT PLACEMENT Left 06/17/2021   Procedure: CYSTOSCOPY/URETEROSCOPY/HOLMIUM LASER/STENT EXCHANGE;  Surgeon: Billey Co, MD;  Location: [facility] ORS;  Service: Urology;  Laterality: Left;   DIALYSIS/PERMA CATHETER  INSERTION N/A 02/24/2021   Procedure: DIALYSIS/PERMA CATHETER INSERTION;  Surgeon: Algernon Huxley, MD;  Location: [place] [facility];  Service: Cardiovascular;  Laterality: N/A;    Social History Social History   Tobacco Use   Smoking status: Never    Passive exposure: Never   Smokeless tobacco: Never  Vaping Use   Vaping Use: Never used  Substance Use Topics   Alcohol use: No    Alcohol/week: 0.0 standard drinks of alcohol   Drug use: No    Family History Family History  Problem Relation Age of Onset   Diabetes Father    Hypertension Father    Parkinson's disease Father    Hypertension Mother    Rheum arthritis Mother    Atrial fibrillation Mother    Heart failure Mother    Heart attack Mother        died in her mid-70's.   Colon cancer Cousin 32   Breast  cancer Neg Hx     Allergies  Allergen Reactions   Spironolactone     Hyperkalemia  Pt unaware of this allergy     REVIEW OF SYSTEMS (Negative unless checked)  Constitutional: '[]'$ Weight loss  '[]'$ Fever  '[]'$ Chills Cardiac: '[]'$ Chest pain   '[]'$ Chest pressure   '[]'$ Palpitations   '[]'$ Shortness of breath when laying flat   '[]'$ Shortness of breath with exertion. Vascular:  '[]'$ Pain in legs with walking   '[]'$ Pain in legs at rest  '[]'$ History of DVT   '[]'$ Phlebitis   '[]'$ Swelling in legs   '[]'$ Varicose veins   '[]'$ Non-healing ulcers Pulmonary:   '[]'$ Uses home oxygen   '[]'$ Productive cough   '[]'$ Hemoptysis   '[]'$ Wheeze  '[]'$ COPD   '[]'$ Asthma Neurologic:  '[]'$ Dizziness   '[]'$ Seizures   '[]'$ History of stroke   '[]'$ History of TIA  '[]'$ Aphasia   '[]'$ Vissual changes   '[]'$ Weakness or numbness in arm   '[]'$ Weakness or numbness in leg Musculoskeletal:   '[]'$ Joint swelling   '[]'$ Joint pain   '[]'$ Low back pain Hematologic:  '[]'$ Easy bruising  '[]'$ Easy bleeding   '[]'$ Hypercoagulable state   '[]'$ Anemic Gastrointestinal:  '[]'$ Diarrhea   '[]'$ Vomiting  '[]'$ Gastroesophageal reflux/heartburn   '[]'$ Difficulty swallowing. Genitourinary:  '[x]'$ Chronic kidney disease   '[]'$ Difficult urination  '[]'$ Frequent urination    '[]'$ Blood in urine Skin:  '[]'$ Rashes   '[]'$ Ulcers  Psychological:  '[]'$ History of anxiety   '[]'$  History of major depression.  Physical Examination  Vitals:   07/22/21 0629  BP: 106/61  Pulse: 84  Resp: 18  Temp: 98.2 F (36.8 C)  TempSrc: Tympanic  SpO2: 95%  Weight: (!) 149.7 kg  Height: '5\' 4"'$  (1.626 m)   Body mass index is 56.64 kg/m. Gen: WD/WN, NAD Head: [place]/AT, No temporalis wasting.  Ear/Nose/Throat: Hearing grossly intact, nares w/o erythema or drainage Eyes: PER, EOMI, sclera nonicteric.  Neck: Supple, no gross masses or lesions.  No JVD.  Pulmonary:  Good air movement, no audible wheezing, no use of accessory muscles.  Cardiac: RRR, precordium non-hyperdynamic. Vascular:   right IJ catheter CD&I Vessel Right Left  Radial Palpable Palpable  Brachial Palpable Palpable  Gastrointestinal: soft, non-distended. No guarding/no peritoneal signs.  Musculoskeletal: M/S 5/5 throughout.  No deformity.  Neurologic: CN 2-12 intact. Pain and light touch intact in extremities.  Symmetrical.  Speech is fluent. Motor exam as listed above. Psychiatric: Judgment intact, Mood & affect appropriate for pt's clinical situation. Dermatologic: No rashes or ulcers noted.  No changes consistent with cellulitis.   CBC Lab Results  Component Value Date   WBC 8.8 06/15/2021   HGB 13.9 07/22/2021   HCT 41.0 07/22/2021   MCV 95.3 06/15/2021   PLT 192 06/15/2021    BMET    Component Value Date/Time   NA 133 (L) 07/22/2021 0645   NA 144 02/27/2020 0931   NA 141 03/09/2014 1533   K 3.4 (L) 07/22/2021 0645   K 4.2 03/09/2014 1533   CL 91 (L) 07/22/2021 0645   CL 101 03/09/2014 1533   CO2 27 06/15/2021 1251   CO2 30 03/09/2014 1533   GLUCOSE 130 (H) 07/22/2021 0645   GLUCOSE 95 03/09/2014 1533   BUN 32 (H) 07/22/2021 0645   BUN 40 (H) 02/27/2020 0931   BUN 35 (H) 03/09/2014 1533   CREATININE 5.20 (H) 07/22/2021 0645   CREATININE 1.50 (H) 03/09/2014 1533   CALCIUM 9.1 06/15/2021 1251    CALCIUM 8.8 03/09/2014 1533   GFRNONAA 8 (L) 06/15/2021 1251   GFRNONAA 39 (L) 03/09/2014 1533   GFRNONAA 55 (L)  07/31/2013 1521   GFRAA 35 (L) 02/27/2020 0931   GFRAA 47 (L) 03/09/2014 1533   GFRAA >60 07/31/2013 1521   Estimated Creatinine Clearance: 17.5 mL/min (A) (by C-G formula based on SCr of 5.2 mg/dL (H)).  COAG Lab Results  Component Value Date   INR 1.1 02/15/2021   INR 1.35 01/12/2018   INR 1.27 11/16/2017    Radiology No results found.   Assessment/Plan 1. ESRD on hemodialysis ([place]) Recommend:   At this time the patient does not have appropriate extremity access for dialysis   Patient should have a left radiocephalic AV fistula created.   The risks, benefits and alternative therapies were reviewed in detail with the patient.  All questions were answered.  The patient agrees to proceed with surgery.    The patient will follow up with me in the office after the surgery.    2. Controlled type 2 diabetes mellitus with hyperglycemia, without long-term current use of insulin (HCC) Continue hypoglycemic medications as already ordered, these medications have been reviewed and there are no changes at this time.   Hgb A1C to be monitored as already arranged by primary service    3. Dyslipidemia Continue statin as ordered and reviewed, no changes at this time      [name], MD  07/22/2021 7:27 AM

## 2021-07-22 NOTE — Transfer of Care (Signed)
Immediate Anesthesia Transfer of Care Note  Patient: Nicole Schmidt  Procedure(s) Performed: ARTERIOVENOUS (AV) FISTULA CREATION (RADIALCEPHALIC) (Left: Arm Lower)  Patient Location: PACU  Anesthesia Type:General  Level of Consciousness: awake  Airway & Oxygen Therapy: Patient Spontanous Breathing  Post-op Assessment: Report given to RN and Post -op Vital signs reviewed and stable  Post vital signs: Reviewed and stable  Last Vitals:  Vitals Value Taken Time  BP 126/72 07/22/21 1002  Temp    Pulse 82 07/22/21 1005  Resp 18 07/22/21 1005  SpO2 93 % 07/22/21 1005  Vitals shown include unvalidated device data.  Last Pain:  Vitals:   07/22/21 0629  TempSrc: Tympanic  PainSc: 0-No pain         Complications: No notable events documented.

## 2021-07-22 NOTE — Anesthesia Preprocedure Evaluation (Signed)
Anesthesia Evaluation  Patient identified by MRN, date of birth, ID band Patient awake  General Assessment Comment:  In past anesthetic, patient retaining CO2 upon extubation, needing BiPAP in PACU.  Reviewed: Allergy & Precautions, NPO status , Patient's Chart, lab work & pertinent test results  History of Anesthesia Complications Negative for: history of anesthetic complications  Airway Mallampati: III  TM Distance: >3 FB Neck ROM: Full    Dental  (+) Poor Dentition, Chipped, Missing   Pulmonary sleep apnea and Oxygen sleep apnea , COPD,  oxygen dependent, Patient abstained from smoking.Not current smoker,     + decreased breath sounds      Cardiovascular Exercise Tolerance: Poor METS: < 3 Mets hypertension, Pt. on medications +CHF  (-) CAD and (-) Past MI (-) dysrhythmias  Rhythm:Regular Rate:Normal - Systolic murmurs 1. Left ventricular ejection fraction, by estimation, is 60 to 65%. The  left ventricle has normal function. Left ventricular endocardial border  not optimally defined to evaluate regional wall motion. There is moderate  left ventricular hypertrophy. Left  ventricular diastolic function could not be evaluated.  2. Right ventricular systolic function is normal. The right ventricular  size is mildly enlarged.  3. Left atrial size was moderately dilated.  4. Right atrial size was moderately dilated.  5. The mitral valve is normal in structure. Trivial mitral valve  regurgitation.  6. The aortic valve has an indeterminant number of cusps. Aortic valve  regurgitation is not visualized. No aortic stenosis is present.  7. The inferior vena cava is normal in size with greater than 50%  respiratory variability, suggesting right atrial pressure of 3 mmHg.    Neuro/Psych negative neurological ROS  negative psych ROS   GI/Hepatic neg GERD  ,(+)     (-) substance abuse  ,   Endo/Other  diabetesHypothyroidism  Morbid obesity  Renal/GU ESRF and DialysisRenal diseaseDialyzed yesterday     Musculoskeletal   Abdominal (+) + obese,   Peds  Hematology   Anesthesia Other Findings Past Medical History: No date: (HFpEF) heart failure with preserved ejection fraction ([place])     Comment:  a. 10/2018 Echo: EF 60-65%, diast dysfxn, RVSP 50.7 mmHg,              mildly dil LA. 02/09/2020: Acquired hypothyroidism No date: Anemia No date: Arthritis 2014: Breast cancer ([place])     Comment:  right breast cancer No date: Breast cancer of upper-inner quadrant of right female breast  ([place])     Comment:  Right breast invasive CA and DCIS , 7 mm T1,N0,M0. Er/PR              pos, her 2 negative.  Margins 1 mm. No date: CHF (congestive heart failure) (HCC) No date: CKD (chronic kidney disease), stage III ([place]) 02/09/2020: Diabetes mellitus type 2 in obese ([place]) No date: Diabetes mellitus without complication ([place]) 54/65/6812: Dyslipidemia No date: Essential hypertension No date: Gout No date: Hypothyroidism No date: Menopause     Comment:  age 71 No date: Morbid obesity ([place]) No date: Persistent atrial fibrillation ([place])     Comment:  a. Dx 10/2018 in setting of PNA. CHA2DS2VASc =               4-->Eliquis; b. 11/2018 s/p successful DCCV. No date: Personal history of radiation therapy 10/2014: Pulmonary embolism (HCC)     Comment:  a. Chronic eliquis. No date: Thyroid goiter  Reproductive/Obstetrics  Anesthesia Physical  Anesthesia Plan  ASA: 4  Anesthesia Plan: General   Post-op Pain Management: Ofirmev IV (intra-op)   Induction: Intravenous  PONV Risk Score and Plan: 3 and Ondansetron, Dexamethasone and Treatment may vary due to age or medical condition  Airway Management Planned: Oral ETT and Video Laryngoscope Planned  Additional Equipment: None  Intra-op Plan:   Post-operative Plan: Extubation in OR  Informed Consent: I have  reviewed the patients History and Physical, chart, labs and discussed the procedure including the risks, benefits and alternatives for the proposed anesthesia with the patient or authorized representative who has indicated his/her understanding and acceptance.     Dental advisory given  Plan Discussed with: CRNA and Surgeon  Anesthesia Plan Comments: (Discussed risks of anesthesia with patient, including PONV, sore throat, lip/dental/eye damage. Rare risks discussed as well, such as cardiorespiratory and neurological sequelae, and allergic reactions. Discussed the role of CRNA in patient's perioperative care. Patient understands. Patient informed about increased incidence of above perioperative risk due to high BMI. Patient understands.  Patient has her CPAP her, can use in PACU.)        Anesthesia Quick Evaluation

## 2021-07-22 NOTE — OR Nursing (Signed)
Per OR room, per Dr. Delana Meyer, pt may resume taking eliquis tomorrow 07/23/21.  Added to d/c instructions/med section.

## 2021-07-22 NOTE — Interval H&P Note (Signed)
History and Physical Interval Note:  07/22/2021 7:28 AM  Nicole Schmidt  has presented today for surgery, with the diagnosis of ESRD.  The various methods of treatment have been discussed with the patient and family. After consideration of risks, benefits and other options for treatment, the patient has consented to  Procedure(s): ARTERIOVENOUS (AV) FISTULA CREATION (RADIALCEPHALIC) (Left) as a surgical intervention.  The patient's history has been reviewed, patient examined, no change in status, stable for surgery.  I have reviewed the patient's chart and labs.  Questions were answered to the patient's satisfaction.     [name]

## 2021-07-22 NOTE — Progress Notes (Signed)
MRN : [id number]  Nicole Schmidt is a 56 y.o. (06-Feb-1966) female who presents with chief complaint of check access.  History of Present Illness:   Current access is via a catheter which is functioning well .  This is the patient's first dialysis access.  There have not been multiple episodes of catheter infection.  The patient denies fever and chills while on dialysis.  No tenderness or drainage at the exit site.   No recent shortening of the patient's walking distance or new symptoms consistent with claudication.  No history of rest pain symptoms. No new ulcers or wounds of the lower extremities have occurred.   The patient is right-hand dominant.   The patient denies amaurosis fugax or recent TIA symptoms. There are no recent neurological changes noted. There is no history of DVT, PE or superficial thrombophlebitis. No recent episodes of angina or shortness of breath documented.    The patient has adequate access for a left radiocephalic AV fistula.    Current Meds  Medication Sig   ADVAIR DISKUS 250-50 MCG/ACT AEPB Inhale 1 puff into the lungs 2 (two) times daily.   albuterol (VENTOLIN HFA) 108 (90 Base) MCG/ACT inhaler Inhale 2 puffs into the lungs every 6 (six) hours as needed for wheezing or shortness of breath.   amLODipine (NORVASC) 5 MG tablet Take 1 tablet (5 mg total) by mouth daily. (Patient taking differently: Take 5 mg by mouth every morning.)   atorvastatin (LIPITOR) 20 MG tablet Take 1 tablet (20 mg total) by mouth at bedtime.   calcitRIOL (ROCALTROL) 0.25 MCG capsule Take 1 capsule (0.25 mcg total) by mouth daily.   calcium carbonate (OS-CAL - DOSED IN MG OF ELEMENTAL CALCIUM) 1250 (500 Ca) MG tablet Take 1 tablet (1,250 mg total) by mouth daily with breakfast.   cloNIDine (CATAPRES) 0.2 MG tablet Take 0.2 mg by mouth 2 (two) times daily.   diclofenac Sodium (VOLTAREN) 1 % GEL Apply 2 g topically 4 (four) times daily. (Patient taking differently: Apply 2 g topically 4  (four) times daily as needed (pain).)   ferric gluconate 250 mg in sodium chloride 0.9 % 250 mL Inject 250 mg into the vein Every Tuesday,Thursday,and Saturday with dialysis.   gabapentin (NEURONTIN) 300 MG capsule Take 1 capsule (300 mg total) by mouth at bedtime.   levothyroxine (SYNTHROID) 112 MCG tablet Take 1 tablet (112 mcg total) by mouth daily before breakfast.   metoprolol succinate (TOPROL-XL) 25 MG 24 hr tablet Take 1 tablet (25 mg total) by mouth daily. Take with or immediately following a meal. (Patient taking differently: Take 25 mg by mouth every morning. Take with or immediately following a meal.)   senna (SENOKOT) 8.6 MG tablet Take 1 tablet by mouth as needed for constipation.   sevelamer carbonate (RENVELA) 800 MG tablet Take 1 tablet (800 mg total) by mouth 3 (three) times daily with meals. (Patient taking differently: Take 1,600 mg by mouth 3 (three) times daily with meals.)   traMADol (ULTRAM) 50 MG tablet Take 1 tablet (50 mg total) by mouth every 12 (twelve) hours as needed for moderate pain.    Past Medical History:  Diagnosis Date   (HFpEF) heart failure with preserved ejection fraction ([place])    a.) 10/2018 Echo: EF 60-65%, diast dysfxn, RVSP 50.7 mmHg; b.) 01/2020 Echo: EF 65-70%, G2DD; c.) 01/2021 Echo: EF 60-65%, no rwma, mod LVH. Nl RV fxn, mildly enlarged RV. Mod BAE. Triv MR. d.) TTE 05/18/2021: EF 55-60; no RWMAs;  mild LVH; mild TR.   Acquired hypothyroidism 02/09/2020   Anemia    Aortic atherosclerosis (HCC)    Arthritis    Breast cancer of upper-inner quadrant of right female breast ([place])    Right breast invasive CA and DCIS , 7 mm T1,N0,M0. Er/PR pos, her 2 negative.  Margins 1 mm.   COPD (chronic obstructive pulmonary disease) (HCC)    Diabetes mellitus type 2 in obese ([place]) 02/09/2020   Dyslipidemia 02/09/2020   ESBL (extended spectrum beta-lactamase) producing bacteria infection 03/20/2021   a.) urine culture (+) for ESBL producing E.coli and P. mirabilis    ESRD (end stage renal disease) on dialysis [facility])    a.) T-Th-Sat HD initiated 02/2021   Essential hypertension    Gout    History of kidney stones    Menopause    age 42   Morbid obesity ([place])    OSA treated with BiPAP    a.) on nocturnal PAP therapy; Trilogy 100   Permanent atrial fibrillation ([place])    a.) Dx 10/2018 in setting of PNA. b.) CHA2DS2-VASc = 7 (sex, HFpEF, HTN, PE x2, aortic plaque, T2DM); c.) s/p successful DCCV 11/2018. d.) subsequent recurrent Afib; rate/rhythm maintained on oral metoprolol succinate; chronically anticoagulated using standard dose apixaban.   Personal history of radiation therapy    Pulmonary embolism ([place]) 10/2014   a.) on chronic apixaban   Thyroid goiter     Past Surgical History:  Procedure Laterality Date   BREAST BIOPSY Right 2014   breast ca   BREAST EXCISIONAL BIOPSY Right 07/19/2012   breast ca   BREAST SURGERY Right 2014   with sentinel node bx subareaolar duct excision   CARDIOVERSION N/A 11/28/2018   Procedure: CARDIOVERSION;  Surgeon: Minna Merritts, MD;  Location: [facility] ORS;  Service: Cardiovascular;  Laterality: N/A;   COLONOSCOPY WITH PROPOFOL N/A 01/30/2017   Procedure: COLONOSCOPY WITH PROPOFOL;  Surgeon: Christene Lye, MD;  Location: [facility] ENDOSCOPY;  Service: Endoscopy;  Laterality: N/A;   CYSTOSCOPY W/ RETROGRADES  06/17/2021   Procedure: CYSTOSCOPY WITH RETROGRADE PYELOGRAM;  Surgeon: Billey Co, MD;  Location: [facility] ORS;  Service: Urology;;   CYSTOSCOPY W/ URETERAL STENT PLACEMENT Left 02/14/2021   Procedure: CYSTOSCOPY WITH RETROGRADE PYELOGRAM/URETERAL STENT PLACEMENT;  Surgeon: Billey Co, MD;  Location: [facility] ORS;  Service: Urology;  Laterality: Left;   CYSTOSCOPY/URETEROSCOPY/HOLMIUM LASER/STENT PLACEMENT Left 06/17/2021   Procedure: CYSTOSCOPY/URETEROSCOPY/HOLMIUM LASER/STENT EXCHANGE;  Surgeon: Billey Co, MD;  Location: [facility] ORS;  Service: Urology;  Laterality: Left;   DIALYSIS/PERMA CATHETER  INSERTION N/A 02/24/2021   Procedure: DIALYSIS/PERMA CATHETER INSERTION;  Surgeon: Algernon Huxley, MD;  Location: [place] [facility];  Service: Cardiovascular;  Laterality: N/A;    Social History Social History   Tobacco Use   Smoking status: Never    Passive exposure: Never   Smokeless tobacco: Never  Vaping Use   Vaping Use: Never used  Substance Use Topics   Alcohol use: No    Alcohol/week: 0.0 standard drinks of alcohol   Drug use: No    Family History Family History  Problem Relation Age of Onset   Diabetes Father    Hypertension Father    Parkinson's disease Father    Hypertension Mother    Rheum arthritis Mother    Atrial fibrillation Mother    Heart failure Mother    Heart attack Mother        died in her mid-70's.   Colon cancer Cousin 55   Breast  cancer Neg Hx     Allergies  Allergen Reactions   Spironolactone     Hyperkalemia  Pt unaware of this allergy     REVIEW OF SYSTEMS (Negative unless checked)  Constitutional: '[]'$ Weight loss  '[]'$ Fever  '[]'$ Chills Cardiac: '[]'$ Chest pain   '[]'$ Chest pressure   '[]'$ Palpitations   '[]'$ Shortness of breath when laying flat   '[]'$ Shortness of breath with exertion. Vascular:  '[]'$ Pain in legs with walking   '[]'$ Pain in legs at rest  '[]'$ History of DVT   '[]'$ Phlebitis   '[]'$ Swelling in legs   '[]'$ Varicose veins   '[]'$ Non-healing ulcers Pulmonary:   '[]'$ Uses home oxygen   '[]'$ Productive cough   '[]'$ Hemoptysis   '[]'$ Wheeze  '[]'$ COPD   '[]'$ Asthma Neurologic:  '[]'$ Dizziness   '[]'$ Seizures   '[]'$ History of stroke   '[]'$ History of TIA  '[]'$ Aphasia   '[]'$ Vissual changes   '[]'$ Weakness or numbness in arm   '[]'$ Weakness or numbness in leg Musculoskeletal:   '[]'$ Joint swelling   '[]'$ Joint pain   '[]'$ Low back pain Hematologic:  '[]'$ Easy bruising  '[]'$ Easy bleeding   '[]'$ Hypercoagulable state   '[]'$ Anemic Gastrointestinal:  '[]'$ Diarrhea   '[]'$ Vomiting  '[]'$ Gastroesophageal reflux/heartburn   '[]'$ Difficulty swallowing. Genitourinary:  '[x]'$ Chronic kidney disease   '[]'$ Difficult urination  '[]'$ Frequent urination    '[]'$ Blood in urine Skin:  '[]'$ Rashes   '[]'$ Ulcers  Psychological:  '[]'$ History of anxiety   '[]'$  History of major depression.  Physical Examination  Vitals:   07/22/21 0629  BP: 106/61  Pulse: 84  Resp: 18  Temp: 98.2 F (36.8 C)  TempSrc: Tympanic  SpO2: 95%  Weight: (!) 149.7 kg  Height: '5\' 4"'$  (1.626 m)   Body mass index is 56.64 kg/m. Gen: WD/WN, NAD Head: [place]/AT, No temporalis wasting.  Ear/Nose/Throat: Hearing grossly intact, nares w/o erythema or drainage Eyes: PER, EOMI, sclera nonicteric.  Neck: Supple, no gross masses or lesions.  No JVD.  Pulmonary:  Good air movement, no audible wheezing, no use of accessory muscles.  Cardiac: RRR, precordium non-hyperdynamic. Vascular:   right IJ catheter CD&I Vessel Right Left  Radial Palpable Palpable  Brachial Palpable Palpable  Gastrointestinal: soft, non-distended. No guarding/no peritoneal signs.  Musculoskeletal: M/S 5/5 throughout.  No deformity.  Neurologic: CN 2-12 intact. Pain and light touch intact in extremities.  Symmetrical.  Speech is fluent. Motor exam as listed above. Psychiatric: Judgment intact, Mood & affect appropriate for pt's clinical situation. Dermatologic: No rashes or ulcers noted.  No changes consistent with cellulitis.   CBC Lab Results  Component Value Date   WBC 8.8 06/15/2021   HGB 13.9 07/22/2021   HCT 41.0 07/22/2021   MCV 95.3 06/15/2021   PLT 192 06/15/2021    BMET    Component Value Date/Time   NA 133 (L) 07/22/2021 0645   NA 144 02/27/2020 0931   NA 141 03/09/2014 1533   K 3.4 (L) 07/22/2021 0645   K 4.2 03/09/2014 1533   CL 91 (L) 07/22/2021 0645   CL 101 03/09/2014 1533   CO2 27 06/15/2021 1251   CO2 30 03/09/2014 1533   GLUCOSE 130 (H) 07/22/2021 0645   GLUCOSE 95 03/09/2014 1533   BUN 32 (H) 07/22/2021 0645   BUN 40 (H) 02/27/2020 0931   BUN 35 (H) 03/09/2014 1533   CREATININE 5.20 (H) 07/22/2021 0645   CREATININE 1.50 (H) 03/09/2014 1533   CALCIUM 9.1 06/15/2021 1251    CALCIUM 8.8 03/09/2014 1533   GFRNONAA 8 (L) 06/15/2021 1251   GFRNONAA 39 (L) 03/09/2014 1533   GFRNONAA 55 (L)  07/31/2013 1521   GFRAA 35 (L) 02/27/2020 0931   GFRAA 47 (L) 03/09/2014 1533   GFRAA >60 07/31/2013 1521   Estimated Creatinine Clearance: 17.5 mL/min (A) (by C-G formula based on SCr of 5.2 mg/dL (H)).  COAG Lab Results  Component Value Date   INR 1.1 02/15/2021   INR 1.35 01/12/2018   INR 1.27 11/16/2017    Radiology No results found.   Assessment/Plan 1. ESRD on hemodialysis ([place]) Recommend:   At this time the patient does not have appropriate extremity access for dialysis   Patient should have a left radiocephalic AV fistula created.   The risks, benefits and alternative therapies were reviewed in detail with the patient.  All questions were answered.  The patient agrees to proceed with surgery.    The patient will follow up with me in the office after the surgery.    2. Controlled type 2 diabetes mellitus with hyperglycemia, without long-term current use of insulin (HCC) Continue hypoglycemic medications as already ordered, these medications have been reviewed and there are no changes at this time.   Hgb A1C to be monitored as already arranged by primary service    3. Dyslipidemia Continue statin as ordered and reviewed, no changes at this time      [name], MD  07/22/2021 7:27 AM

## 2021-07-23 ENCOUNTER — Encounter: Payer: Self-pay | Admitting: Vascular Surgery

## 2021-07-23 DIAGNOSIS — G4733 Obstructive sleep apnea (adult) (pediatric): Secondary | ICD-10-CM | POA: Diagnosis not present

## 2021-07-23 DIAGNOSIS — I5032 Chronic diastolic (congestive) heart failure: Secondary | ICD-10-CM | POA: Diagnosis not present

## 2021-07-23 DIAGNOSIS — I482 Chronic atrial fibrillation, unspecified: Secondary | ICD-10-CM | POA: Diagnosis not present

## 2021-07-23 DIAGNOSIS — J9601 Acute respiratory failure with hypoxia: Secondary | ICD-10-CM | POA: Diagnosis not present

## 2021-07-26 ENCOUNTER — Other Ambulatory Visit: Payer: Self-pay

## 2021-07-26 ENCOUNTER — Emergency Department
Admission: EM | Admit: 2021-07-26 | Discharge: 2021-07-27 | Disposition: A | Discharge status: Home / Self Care | Payer: BC Managed Care – PPO | Attending: Emergency Medicine | Admitting: Emergency Medicine

## 2021-07-26 DIAGNOSIS — R109 Unspecified abdominal pain: Secondary | ICD-10-CM | POA: Insufficient documentation

## 2021-07-26 DIAGNOSIS — I959 Hypotension, unspecified: Secondary | ICD-10-CM | POA: Diagnosis not present

## 2021-07-26 DIAGNOSIS — D3502 Benign neoplasm of left adrenal gland: Secondary | ICD-10-CM | POA: Diagnosis not present

## 2021-07-26 DIAGNOSIS — I700 Atherosclerosis of aorta: Secondary | ICD-10-CM | POA: Diagnosis not present

## 2021-07-26 DIAGNOSIS — Z992 Dependence on renal dialysis: Secondary | ICD-10-CM | POA: Diagnosis not present

## 2021-07-26 DIAGNOSIS — K5900 Constipation, unspecified: Secondary | ICD-10-CM | POA: Diagnosis not present

## 2021-07-26 DIAGNOSIS — K5730 Diverticulosis of large intestine without perforation or abscess without bleeding: Secondary | ICD-10-CM | POA: Diagnosis not present

## 2021-07-26 LAB — CBC WITH DIFFERENTIAL/PLATELET
Abs Immature Granulocytes: 0.16 10*3/uL — ABNORMAL HIGH (ref 0.00–0.07)
Basophils Absolute: 0.1 10*3/uL (ref 0.0–0.1)
Basophils Relative: 0 %
Eosinophils Absolute: 0.2 10*3/uL (ref 0.0–0.5)
Eosinophils Relative: 1 %
HCT: 39 % (ref 36.0–46.0)
Hemoglobin: 12.2 g/dL (ref 12.0–15.0)
Immature Granulocytes: 1 %
Lymphocytes Relative: 9 %
Lymphs Abs: 1.3 10*3/uL (ref 0.7–4.0)
MCH: 30.2 pg (ref 26.0–34.0)
MCHC: 31.3 g/dL (ref 30.0–36.0)
MCV: 96.5 fL (ref 80.0–100.0)
Monocytes Absolute: 1 10*3/uL (ref 0.1–1.0)
Monocytes Relative: 7 %
Neutro Abs: 12.7 10*3/uL — ABNORMAL HIGH (ref 1.7–7.7)
Neutrophils Relative %: 82 %
Platelets: 213 10*3/uL (ref 150–400)
RBC: 4.04 MIL/uL (ref 3.87–5.11)
RDW: 17.5 % — ABNORMAL HIGH (ref 11.5–15.5)
WBC: 15.5 10*3/uL — ABNORMAL HIGH (ref 4.0–10.5)
nRBC: 0 % (ref 0.0–0.2)

## 2021-07-26 LAB — COMPREHENSIVE METABOLIC PANEL
ALT: 6 U/L (ref 0–44)
AST: 17 U/L (ref 15–41)
Albumin: 3.6 g/dL (ref 3.5–5.0)
Alkaline Phosphatase: 162 U/L — ABNORMAL HIGH (ref 38–126)
Anion gap: 13 (ref 5–15)
BUN: 57 mg/dL — ABNORMAL HIGH (ref 6–20)
CO2: 27 mmol/L (ref 22–32)
Calcium: 8.9 mg/dL (ref 8.9–10.3)
Chloride: 91 mmol/L — ABNORMAL LOW (ref 98–111)
Creatinine, Ser: 5.84 mg/dL — ABNORMAL HIGH (ref 0.44–1.00)
GFR, Estimated: 8 mL/min — ABNORMAL LOW (ref >60)
Glucose, Bld: 164 mg/dL — ABNORMAL HIGH (ref 70–99)
Potassium: 4.2 mmol/L (ref 3.5–5.1)
Sodium: 131 mmol/L — ABNORMAL LOW (ref 135–145)
Total Bilirubin: 0.8 mg/dL (ref 0.3–1.2)
Total Protein: 7.5 g/dL (ref 6.5–8.1)

## 2021-07-26 LAB — LIPASE, BLOOD: Lipase: 50 U/L (ref 11–51)

## 2021-07-26 NOTE — ED Provider Notes (Signed)
[facility] Provider Note    Event Date/Time   First MD Initiated Contact with Patient 07/26/21 2243     (approximate)   History   Constipation   HPI  Nicole Schmidt is a 56 y.o. female who comes in with concerns for constipation abdominal pain.  Patient reports that she has not had any bowel movement for the past 6 days.  She reports having a lot of pressure in her rectum and feeling like she has to go but even with pressing down she cannot go.  She does report having a dialysis catheter placed 4 days ago.  She reports taking 1 pain medication for it but otherwise has not been on any opioids.  She does report that she had dialysis done today without any issues she reports having a urinary stent placed previously but denies any other abdominal surgeries.  She reports that she does not really have pain but just more pressure in her rectum     Physical Exam   Triage Vital Signs: ED Triage Vitals  Enc Vitals Group     BP 07/26/21 2219 119/65     Pulse Rate 07/26/21 2219 82     Resp 07/26/21 2219 18     Temp 07/26/21 2219 98.7 F (37.1 C)     Temp Source 07/26/21 2219 Oral     SpO2 07/26/21 2219 95 %     Weight 07/26/21 2217 (!) 330 lb (149.7 kg)     Height 07/26/21 2217 '5\' 4"'$  (1.626 m)     Head Circumference --      Peak Flow --      Pain Score 07/26/21 2217 0     Pain Loc --      Pain Edu? --      Excl. in [place]? --     Most recent vital signs: Vitals:   07/26/21 2219  BP: 119/65  Pulse: 82  Resp: 18  Temp: 98.7 F (37.1 C)  SpO2: 95%     General: Awake, no distress.  CV:  Good peripheral perfusion.  Resp:  Normal effort.  Abd:  No distention.  Abdomen soft and nontender Other:  Patient has large stool ball that is brown in nature.  This was able to be removed with manual disimpaction   ED Results / Procedures / Treatments   Labs (all labs ordered are listed, but only abnormal results are displayed) Labs Reviewed  CBC WITH  DIFFERENTIAL/PLATELET  COMPREHENSIVE METABOLIC PANEL  LIPASE, BLOOD    RADIOLOGY  PROCEDURES:  Critical Care performed: No  Procedures  ------------------------------------------------------------------------------------------------------------------- Fecal Disimpaction Procedure Note:  Performed by me:  Patient placed in the lateral recumbent position with knees drawn towards chest. Nurse present for patient support. Large amount of hard brown stool removed. No complications during procedure.   ------------------------------------------------------------------------------------------------------------------   MEDICATIONS ORDERED IN ED: Medications - No data to display   IMPRESSION / MDM / [place] / ED COURSE  I reviewed the triage vital signs and the nursing notes.   Patient's presentation is most consistent with acute, uncomplicated illness.   This is most for constipation.  Her abdomen is soft and nontender and I have lower suspicion for acute abdominal pathology.  We discussed CT imaging of elected to hold off.  Given she is dialysis patient basic labs and belly labs will be ordered.  Rectal exam did show a lot of brown stool, stool ball.  Patient was not getting better with MiraLAX so  disimpaction was done.  We will try enema to help facilitate bowel movement.  Patient be handed off to oncoming team pending labs and reassessment after bowel movement to decide if any imaging is necessary but at this time I have low suspicion for other acute abdominal pathology and suspect this is all just from constipation  FINAL CLINICAL IMPRESSION(S) / ED DIAGNOSES   Final diagnoses:  Constipation, unspecified constipation type     Rx / DC Orders   ED Discharge Orders     None        Note:  This document was prepared using Dragon voice recognition software and may include unintentional dictation errors.   Vanessa Redgranite, MD 07/26/21 [phone]

## 2021-07-26 NOTE — ED Triage Notes (Signed)
To room 8 via [facility] with c/o constipation and abd pain x 6 days.  Pt recently had dialysis cath placed x 4 days and was prescribed pain meds for home.  Dialysis today

## 2021-07-27 ENCOUNTER — Emergency Department: Payer: BC Managed Care – PPO

## 2021-07-27 DIAGNOSIS — D3502 Benign neoplasm of left adrenal gland: Secondary | ICD-10-CM | POA: Diagnosis not present

## 2021-07-27 DIAGNOSIS — K5730 Diverticulosis of large intestine without perforation or abscess without bleeding: Secondary | ICD-10-CM | POA: Diagnosis not present

## 2021-07-27 DIAGNOSIS — I700 Atherosclerosis of aorta: Secondary | ICD-10-CM | POA: Diagnosis not present

## 2021-07-27 MED ORDER — POLYETHYLENE GLYCOL 3350 17 G PO PACK
51.0000 g | PACK | Freq: Once | ORAL | Status: AC
Start: 2021-07-27 — End: 2021-07-27
  Administered 2021-07-27: 51 g via ORAL
  Filled 2021-07-27: qty 3

## 2021-07-27 MED ORDER — SORBITOL 70 % SOLN
960.0000 mL | TOPICAL_OIL | Freq: Once | ORAL | Status: AC
  Administered 2021-07-27: 960 mL via RECTAL
  Filled 2021-07-27: qty 473

## 2021-07-27 NOTE — ED Provider Notes (Signed)
Patient received in signout from Dr. Jari Pigg pending enema administration and reassessment.  Patient presents with constipation.  She does not produce any stool with the soapsuds enema provided.  I reexamined the patient and she has some mild tenderness throughout her abdomen reports significant concern that she has not passed a bowel movement in nearly a week.  Order CT abdomen/pelvis without evidence of SBO.  Perform a stool disimpaction with moderate volume hard stool in the rectum and subsequent relief of her symptoms.  We discussed management of constipation at home and she is suitable for outpatient management.   Vladimir Crofts, MD 07/27/21 [phone]

## 2021-07-27 NOTE — ED Notes (Signed)
Pt given soap suds enema at this time.

## 2021-08-03 ENCOUNTER — Encounter: Payer: Self-pay | Admitting: Nurse Practitioner

## 2021-08-03 ENCOUNTER — Ambulatory Visit: Payer: BC Managed Care – PPO | Admitting: Nurse Practitioner

## 2021-08-03 VITALS — BP 100/58 | HR 77 | Ht 64.0 in | Wt 343.0 lb

## 2021-08-03 DIAGNOSIS — I482 Chronic atrial fibrillation, unspecified: Secondary | ICD-10-CM | POA: Diagnosis not present

## 2021-08-03 DIAGNOSIS — I10 Essential (primary) hypertension: Secondary | ICD-10-CM | POA: Diagnosis not present

## 2021-08-03 DIAGNOSIS — I248 Other forms of acute ischemic heart disease: Secondary | ICD-10-CM

## 2021-08-03 DIAGNOSIS — I5032 Chronic diastolic (congestive) heart failure: Secondary | ICD-10-CM

## 2021-08-03 DIAGNOSIS — N186 End stage renal disease: Secondary | ICD-10-CM | POA: Diagnosis not present

## 2021-08-03 DIAGNOSIS — E1169 Type 2 diabetes mellitus with other specified complication: Secondary | ICD-10-CM

## 2021-08-03 DIAGNOSIS — E782 Mixed hyperlipidemia: Secondary | ICD-10-CM

## 2021-08-03 DIAGNOSIS — J9611 Chronic respiratory failure with hypoxia: Secondary | ICD-10-CM

## 2021-08-03 DIAGNOSIS — E669 Obesity, unspecified: Secondary | ICD-10-CM

## 2021-08-03 NOTE — Patient Instructions (Signed)
Medication Instructions:   Your physician recommends that you continue on your current medications as directed. Please refer to the Current Medication list given to you today.  *If you need a refill on your cardiac medications before your next appointment, please call your pharmacy*   Lab Work:  None ordered  Testing/Procedures:  None ordered   Follow-Up: At [facility], you and your health needs are our priority.  As part of our continuing mission to provide you with exceptional heart care, we have created designated Provider Care Teams.  These Care Teams include your primary Cardiologist (physician) and Advanced Practice Providers (APPs -  Physician Assistants and Nurse Practitioners) who all work together to provide you with the care you need, when you need it.  We recommend signing up for the patient portal called "MyChart".  Sign up information is provided on this After Visit Summary.  MyChart is used to connect with patients for Virtual Visits (Telemedicine).  Patients are able to view lab/test results, encounter notes, upcoming appointments, etc.  Non-urgent messages can be sent to your provider as well.   To learn more about what you can do with MyChart, go to [email or web address].    Your next appointment:   4 - 6  month(s)  The format for your next appointment:   In Person  Provider:   You may see Kathlyn [place], MD or one of the following Advanced Practice Providers on your designated Care Team:   Murray Hodgkins, NP   Important Information About Sugar

## 2021-08-03 NOTE — Progress Notes (Signed)
Office Visit    Patient Name: ERMIE GLENDENNING Date of Encounter: 08/03/2021  Primary Care Provider:  Lenard Simmer, MD Primary Cardiologist:  Kathlyn Sacramento, MD  Chief Complaint    56 year old female with a history of breast cancer status post radiation, chronic HFpEF, permanent atrial fibrillation, hypertension, hyperlipidemia, type 2 diabetes mellitus, end-stage renal disease, anemia, obesity, hypothyroidism, and pulmonary embolism, who presents for follow-up for HFpEF.  Past Medical History    Past Medical History:  Diagnosis Date   (HFpEF) heart failure with preserved ejection fraction ([place])    a. 10/2018 Echo: EF 60-65%, diast dysfxn, RVSP 50.7 mmHg; b. 01/2020 Echo: EF 65-70%, G2DD; c. 01/2021 Echo: EF 60-65%, no rwma, mod LVH. Nl RV fxn, mildly enlarged RV. Mod BAE. Triv MR. d. 05/2021 Echo: EF 55-60; no RWMAs, mild LVH, mild TR.   Acquired hypothyroidism 02/09/2020   Anemia    Aortic atherosclerosis (HCC)    Arthritis    Breast cancer of upper-inner quadrant of right female breast ([place])    Right breast invasive CA and DCIS , 7 mm T1,N0,M0. Er/PR pos, her 2 negative.  Margins 1 mm.   COPD (chronic obstructive pulmonary disease) (HCC)    Diabetes mellitus type 2 in obese ([place]) 02/09/2020   Dyslipidemia 02/09/2020   ESBL (extended spectrum beta-lactamase) producing bacteria infection 03/20/2021   a. urine culture (+) for ESBL producing E.coli and P. mirabilis   ESRD (end stage renal disease) on dialysis ([place])    a. T-Th-Sat HD initiated 02/2021   Essential hypertension    Gout    History of kidney stones    Menopause    age 5   Morbid obesity ([place])    OSA treated with BiPAP    a. on nocturnal PAP therapy; Trilogy 100   Permanent atrial fibrillation ([place])    a. Dx 10/2018 in setting of PNA. b. CHA2DS2-VASc = 7 (sex, HFpEF, HTN, PE x2, aortic plaque, T2DM); c. s/p successful DCCV 11/2018. d. subsequent recurrent Afib; rate/rhythm maintained on oral metoprolol  succinate; chronically anticoagulated using standard dose apixaban.   Personal history of radiation therapy    Pulmonary embolism ([place]) 10/2014   a. on chronic apixaban   Thyroid goiter    Past Surgical History:  Procedure Laterality Date   AV FISTULA PLACEMENT Left 07/22/2021   Procedure: ARTERIOVENOUS (AV) FISTULA CREATION ([place]);  Surgeon: Katha Cabal, MD;  Location: [facility];  Service: Vascular;  Laterality: Left;   BREAST BIOPSY Right 2014   breast ca   BREAST EXCISIONAL BIOPSY Right 07/19/2012   breast ca   BREAST SURGERY Right 2014   with sentinel node bx subareaolar duct excision   CARDIOVERSION N/A 11/28/2018   Procedure: CARDIOVERSION;  Surgeon: Minna Merritts, MD;  Location: [facility];  Service: Cardiovascular;  Laterality: N/A;   COLONOSCOPY WITH PROPOFOL N/A 01/30/2017   Procedure: COLONOSCOPY WITH PROPOFOL;  Surgeon: Christene Lye, MD;  Location: [facility];  Service: Endoscopy;  Laterality: N/A;   CYSTOSCOPY W/ RETROGRADES  06/17/2021   Procedure: CYSTOSCOPY WITH RETROGRADE PYELOGRAM;  Surgeon: Billey Co, MD;  Location: [facility];  Service: Urology;;   CYSTOSCOPY W/ URETERAL STENT PLACEMENT Left 02/14/2021   Procedure: CYSTOSCOPY WITH RETROGRADE PYELOGRAM/URETERAL STENT PLACEMENT;  Surgeon: Billey Co, MD;  Location: [facility];  Service: Urology;  Laterality: Left;   CYSTOSCOPY/URETEROSCOPY/HOLMIUM LASER/STENT PLACEMENT Left 06/17/2021   Procedure: CYSTOSCOPY/URETEROSCOPY/HOLMIUM LASER/STENT EXCHANGE;  Surgeon: Billey Co, MD;  Location: [facility];  Service:  Urology;  Laterality: Left;   DIALYSIS/PERMA CATHETER INSERTION N/A 02/24/2021   Procedure: DIALYSIS/PERMA CATHETER INSERTION;  Surgeon: Algernon Huxley, MD;  Location: [facility];  Service: Cardiovascular;  Laterality: N/A;    Allergies  Allergies  Allergen Reactions   Spironolactone     Hyperkalemia  Pt unaware of this allergy    History of Present Illness     56 year old female with the above past medical history including breast cancer status post radiation, chronic HFpEF, permanent atrial fibrillation, hypertension, hyperlipidemia, type 2 diabetes mellitus, end-stage renal disease, anemia, obesity, hypothyroidism, and pulmonary embolism.  She has been on chronic Eliquis following PE in September 2016.  She was diagnosed with atrial fibrillation in September 2020, and subsequently underwent cardioversion in October 2020, without significant improvement in baseline level of dyspnea and fatigue  In December 2021, she was hospitalized with respiratory failure secondary to heart failure and pneumonia.  Echo showed an EF of 65 to 70% with severe LVH.  Spironolactone was discontinued secondary to hyperkalemia.  At office follow-up in July 2022, she was noted to be back in atrial fibrillation and was minimally symptomatic.  In the setting of multiple comorbidities, it was felt that maintaining sinus rhythm would be difficult and decision was made to pursue a rate control strategy.  She required admission to [name] in December 2022 secondary to heart failure and acute kidney injury.  Creatinine was 5.82 on admission.  She subsequently required permacath placement and initiation of hemodialysis in January 2023.  She required readmission in early February due to heart failure and respiratory failure, and required dialysis with ultrafiltration.  She has since been on a trilogy device at home.  In March 2023, she was seen in the emergency department secondary to hypoxia and altered mental status, after she took off her trilogy mask and failed to reapply it.  She was found by family member.  In the ED, troponin was 164.  We saw her in follow-up on March 15, at which time she was feeling well.  She was tolerating Tuesday, Thursday, Saturday dialysis.  I obtained a limited echo showed an EF of 55 to 60% without regional wall motion abnormalities.  Continued conservative management was  advised.  We opted to not pursue ischemic testing secondary to lack of symptoms and body habitus.  Since her last visit, Ms. Blow has required cystoscopy and left ureteral stent removal in early May, and more recently left arm AV fistula placement.  She was seen in the emergency department June 13 and 14 due to constipation requiring enema.  CT of the abdomen pelvis showed no evidence of SBO.  She required disimpaction.  From a cardiac standpoint she notes that she has been doing well.  She has been tolerating Tuesday, Thursday, Saturday dialysis well, though sometimes her blood pressure drops during dialysis for which she will use a midodrine as needed.  She think she has been taking her amlodipine 5 mg twice daily and we discussed that this should be taken once a day only.  She is holding blood pressure medicines on the mornings of dialysis.  She remains fairly sedentary but is not experiencing significant dyspnea on exertion and denies any chest pain.  She occasionally notes lower extremity swelling.  She denies palpitations, PND, orthopnea, dizziness, syncope, or early satiety.  Home Medications    Current Outpatient Medications  Medication Sig Dispense Refill   acetaminophen (TYLENOL) 325 MG tablet Take 2 tablets (650 mg total) by mouth every  6 (six) hours as needed for mild pain (or Fever >/= 101).     ADVAIR DISKUS 250-50 MCG/ACT AEPB Inhale 1 puff into the lungs 2 (two) times daily.     albuterol (VENTOLIN HFA) 108 (90 Base) MCG/ACT inhaler Inhale 2 puffs into the lungs every 6 (six) hours as needed for wheezing or shortness of breath. 8.5 g 0   amLODipine (NORVASC) 5 MG tablet Take 1 tablet (5 mg total) by mouth daily. 90 tablet 3   apixaban (ELIQUIS) 5 MG TABS tablet Take 1 tablet (5 mg total) by mouth 2 (two) times daily. 180 tablet 1   atorvastatin (LIPITOR) 20 MG tablet Take 1 tablet (20 mg total) by mouth at bedtime. 90 tablet 3   calcitRIOL (ROCALTROL) 0.25 MCG capsule Take 1 capsule  (0.25 mcg total) by mouth daily. 30 capsule 0   calcium carbonate (OS-CAL - DOSED IN MG OF ELEMENTAL CALCIUM) 1250 (500 Ca) MG tablet Take 1 tablet (1,250 mg total) by mouth daily with breakfast.     cloNIDine (CATAPRES) 0.2 MG tablet Take 0.2 mg by mouth 2 (two) times daily.     diclofenac Sodium (VOLTAREN) 1 % GEL Apply 2 g topically 4 (four) times daily. 100 g 0   ergocalciferol (VITAMIN D2) 1.25 MG (50000 UT) capsule Take 1 capsule (50,000 Units total) by mouth once a week. 5 capsule 0   ferric gluconate 250 mg in sodium chloride 0.9 % 250 mL Inject 250 mg into the vein Every Tuesday,Thursday,and Saturday with dialysis.     folic acid (FOLVITE) 1 MG tablet Take 1 tablet (1 mg total) by mouth daily. 30 tablet 0   gabapentin (NEURONTIN) 300 MG capsule Take 1 capsule (300 mg total) by mouth at bedtime. 30 capsule 0   HYDROcodone-acetaminophen (NORCO) 5-325 MG tablet Take 1-2 tablets by mouth every 6 (six) hours as needed. 25 tablet 0   Lancets (ONETOUCH DELICA PLUS [id number]) MISC      levothyroxine (SYNTHROID) 112 MCG tablet Take 1 tablet (112 mcg total) by mouth daily before breakfast. 30 tablet 0   melatonin 5 MG TABS Take 2 tablets (10 mg total) by mouth at bedtime as needed (sleep). 60 tablet 0   metoprolol succinate (TOPROL-XL) 25 MG 24 hr tablet Take 1 tablet (25 mg total) by mouth daily. Take with or immediately following a meal. 90 tablet 3   midodrine (PROAMATINE) 10 MG tablet Take 1 tablet (10 mg total) by mouth Every Tuesday,Thursday,and Saturday with dialysis. 30 tablet 0   ONETOUCH ULTRA test strip      senna (SENOKOT) 8.6 MG tablet Take 1 tablet by mouth as needed for constipation.     sevelamer carbonate (RENVELA) 800 MG tablet Take 1 tablet (800 mg total) by mouth 3 (three) times daily with meals. 90 tablet 0   traMADol (ULTRAM) 50 MG tablet Take 1 tablet (50 mg total) by mouth every 12 (twelve) hours as needed for moderate pain. 30 tablet 0   TRULICITY 1.5 KK/9.3GH SOPN Inject  1.5 mg into the skin every Monday.     No current facility-administered medications for this visit.     Review of Systems    Overall stable from a cardiac standpoint.  She has chronic, stable dyspnea exertion and occasional lower extremity swelling.  She denies chest pain, palpitations, PND, orthopnea, dizziness, syncope, or early satiety.  All other systems reviewed and are otherwise negative except as noted above.    Physical Exam    VS:  BP Marland Kitchen)  100/58 (BP Location: Right Wrist, Patient Position: Sitting, Cuff Size: Large)   Pulse 77   Ht '5\' 4"'$  (1.626 m)   Wt (!) 343 lb (155.6 kg)   LMP 07/17/2012   SpO2 96%   BMI 58.88 kg/m  , BMI Body mass index is 58.88 kg/m.     GEN: Well nourished, well developed, in no acute distress. HEENT: normal. Neck: Supple, obese, difficult to gauge JVP.  No bruits or masses.   Cardiac: Irregularly irregular, no murmurs, rubs, or gallops. No clubbing, cyanosis, trace bilateral lower leg edema.  Radials 2+/PT 1+ and equal bilaterally.  Respiratory:  Respirations regular and unlabored, clear to auscultation bilaterally. GI: Obese, soft, nontender, nondistended, BS + x 4. MS: no deformity or atrophy. Skin: warm and dry, no rash.  Left forearm AV fistula surgical site is healing well without erythema, bleeding, or hematoma. Neuro:  Strength and sensation are intact. Psych: Normal affect.  Accessory Clinical Findings    ECG personally reviewed by me today -atrial fibrillation, 77- no acute changes.  Lab Results  Component Value Date   WBC 15.5 (H) 07/26/2021   HGB 12.2 07/26/2021   HCT 39.0 07/26/2021   MCV 96.5 07/26/2021   PLT 213 07/26/2021   Lab Results  Component Value Date   CREATININE 5.84 (H) 07/26/2021   BUN 57 (H) 07/26/2021   NA 131 (L) 07/26/2021   K 4.2 07/26/2021   CL 91 (L) 07/26/2021   CO2 27 07/26/2021   Lab Results  Component Value Date   ALT 6 07/26/2021   AST 17 07/26/2021   ALKPHOS 162 (H) 07/26/2021   BILITOT  0.8 07/26/2021   Lab Results  Component Value Date   CHOL 122 02/09/2020   HDL 49 02/09/2020   LDLCALC 42 02/09/2020   TRIG 153 (H) 02/09/2020   CHOLHDL 2.5 02/09/2020    Lab Results  Component Value Date   HGBA1C 7.2 (H) 02/09/2021    Assessment & Plan    1.  Chronic HFpEF: Volume now managed by nephrology/hemodialysis.  She continues to make some urine.  She has been tolerating dialysis well and though she occasionally notes lower extremity swelling, overall, volume status has been stable.  She has chronic dyspnea on exertion which is also stable.  Heart rate and blood pressure are well controlled and if anything, blood pressure is somewhat soft.  She believes she has been taking amlodipine 5 mg twice daily and I advised that she should only be taking this once a day.  She remains on metoprolol XL and clonidine as well.  2.  Essential hypertension: See #1.  Blood pressure soft today.  She sometimes requires midodrine at dialysis despite holding her morning blood pressure medications.  She has been taking amlodipine twice a day and I encouraged her to reduce this to just once a day.  4.  Permanent atrial fibrillation: Permanent since at least July 2022.  She is well rate controlled and asymptomatic.  Remains on beta-blocker and Eliquis therapy.  5.  Hyperlipidemia: Followed by primary care.  She remains on atorvastatin therapy.  Normal AST and ALT earlier this month.  6.  Type 2 diabetes mellitus: A1c 7.2 in December.  Followed by primary care.  7.  Chronic hypoxic respiratory failure: Uses trilogy BiPAP at night and for naps during dialysis.  She notes that this is significantly changed her outlook on things and tolerates it very well.  8.  Demand ischemia: Mild troponin elevation to 161 during hospitalization  earlier this year related to respiratory failure and hypoxia after she removed her trilogy BiPAP mask and subsequently lost consciousness.  Limited echo in April showed normal LV  function.  She does not experience chest pain.  We will continue conservative medical management.  She is a poor candidate for nuclear stress testing given body habitus, though could consider PET/CT stress testing in the future if she develops symptoms.  9.  End-stage renal disease: Tolerating this reasonably well via upper chest port at this time.  Status post recent left forearm AV fistula.    10.  Disposition: Follow-up in 4 months or sooner if necessary.   Murray Hodgkins, NP 08/03/2021, 5:21 PM

## 2021-08-10 ENCOUNTER — Ambulatory Visit: Payer: BC Managed Care – PPO

## 2021-08-12 ENCOUNTER — Ambulatory Visit: Payer: BC Managed Care – PPO | Attending: Primary Care

## 2021-08-12 DIAGNOSIS — Z992 Dependence on renal dialysis: Secondary | ICD-10-CM | POA: Diagnosis not present

## 2021-08-12 DIAGNOSIS — R0602 Shortness of breath: Secondary | ICD-10-CM | POA: Insufficient documentation

## 2021-08-12 DIAGNOSIS — Z79899 Other long term (current) drug therapy: Secondary | ICD-10-CM | POA: Diagnosis not present

## 2021-08-12 DIAGNOSIS — N186 End stage renal disease: Secondary | ICD-10-CM | POA: Diagnosis not present

## 2021-08-12 DIAGNOSIS — Z7951 Long term (current) use of inhaled steroids: Secondary | ICD-10-CM | POA: Diagnosis not present

## 2021-08-17 DIAGNOSIS — C50819 Malignant neoplasm of overlapping sites of unspecified female breast: Secondary | ICD-10-CM | POA: Diagnosis not present

## 2021-08-17 DIAGNOSIS — E785 Hyperlipidemia, unspecified: Secondary | ICD-10-CM | POA: Diagnosis not present

## 2021-08-17 DIAGNOSIS — E1165 Type 2 diabetes mellitus with hyperglycemia: Secondary | ICD-10-CM | POA: Diagnosis not present

## 2021-08-17 DIAGNOSIS — D352 Benign neoplasm of pituitary gland: Secondary | ICD-10-CM | POA: Diagnosis not present

## 2021-08-17 DIAGNOSIS — N184 Chronic kidney disease, stage 4 (severe): Secondary | ICD-10-CM | POA: Diagnosis not present

## 2021-08-17 DIAGNOSIS — E039 Hypothyroidism, unspecified: Secondary | ICD-10-CM | POA: Diagnosis not present

## 2021-08-17 DIAGNOSIS — I10 Essential (primary) hypertension: Secondary | ICD-10-CM | POA: Diagnosis not present

## 2021-08-18 ENCOUNTER — Other Ambulatory Visit (INDEPENDENT_AMBULATORY_CARE_PROVIDER_SITE_OTHER): Payer: Self-pay | Admitting: Vascular Surgery

## 2021-08-18 DIAGNOSIS — N186 End stage renal disease: Secondary | ICD-10-CM

## 2021-08-18 DIAGNOSIS — Z9889 Other specified postprocedural states: Secondary | ICD-10-CM

## 2021-08-19 ENCOUNTER — Ambulatory Visit (INDEPENDENT_AMBULATORY_CARE_PROVIDER_SITE_OTHER): Payer: BC Managed Care – PPO | Admitting: Nurse Practitioner

## 2021-08-19 ENCOUNTER — Ambulatory Visit (INDEPENDENT_AMBULATORY_CARE_PROVIDER_SITE_OTHER): Payer: BC Managed Care – PPO

## 2021-08-19 ENCOUNTER — Encounter (INDEPENDENT_AMBULATORY_CARE_PROVIDER_SITE_OTHER): Payer: Self-pay | Admitting: Nurse Practitioner

## 2021-08-19 DIAGNOSIS — Z9889 Other specified postprocedural states: Secondary | ICD-10-CM

## 2021-08-19 DIAGNOSIS — N186 End stage renal disease: Secondary | ICD-10-CM

## 2021-08-20 ENCOUNTER — Encounter (INDEPENDENT_AMBULATORY_CARE_PROVIDER_SITE_OTHER): Payer: Self-pay | Admitting: Nurse Practitioner

## 2021-08-20 NOTE — Progress Notes (Signed)
Subjective:    Patient ID: Nicole Schmidt, female    DOB: [birth year], 56 y.o.   MRN: [id number] Chief Complaint  Patient presents with   Routine Post Op    [place] 3 week follow up    Nicole Schmidt is a 56 year old female who presents today from her dialysis center with concern about her new radiocephalic AV fistula placement.  She notes that they have been hearing a whistling in the area.  The patient was placed on 07/22/2021.  She denies any significant pain in her hand although some numbness in her thumb area.  The wound is healing well with some scabbing.  Today noninvasive studies show flow volume of 430 however there is a noted significant narrowing at the anastomosis site.  This narrowing extends approximately 1.5 cm past the anastomosis site.  Based on this during the fistula is widely patent.    Review of Systems  Skin:  Positive for wound.  All other systems reviewed and are negative.      Objective:   Physical Exam Vitals reviewed.  HENT:     Head: Normocephalic.  Cardiovascular:     Rate and Rhythm: Normal rate.     Pulses: Normal pulses.     Arteriovenous access: Left arteriovenous access is present.    Comments: Very faint thrill with whistling bruit Pulmonary:     Effort: Pulmonary effort is normal.  Skin:    General: Skin is warm and dry.  Neurological:     Mental Status: She is alert and oriented to person, place, and time.  Psychiatric:        Mood and Affect: Mood normal.        Behavior: Behavior normal.        Thought Content: Thought content normal.        Judgment: Judgment normal.     BP (!) 146/69 (BP Location: Right Arm)   Pulse 92   Resp 16   Wt (!) 345 lb 9.6 oz (156.8 kg)   LMP 07/17/2012   BMI 59.32 kg/m   Past Medical History:  Diagnosis Date   (HFpEF) heart failure with preserved ejection fraction ([place])    a. 10/2018 Echo: EF 60-65%, diast dysfxn, RVSP 50.7 mmHg; b. 01/2020 Echo: EF 65-70%, G2DD; c. 01/2021 Echo: EF 60-65%, no rwma,  mod LVH. Nl RV fxn, mildly enlarged RV. Mod BAE. Triv MR. d. 05/2021 Echo: EF 55-60; no RWMAs, mild LVH, mild TR.   Acquired hypothyroidism 02/09/2020   Anemia    Aortic atherosclerosis (HCC)    Arthritis    Breast cancer of upper-inner quadrant of right female breast ([place])    Right breast invasive CA and DCIS , 7 mm T1,N0,M0. Er/PR pos, her 2 negative.  Margins 1 mm.   COPD (chronic obstructive pulmonary disease) (HCC)    Diabetes mellitus type 2 in obese ([place]) 02/09/2020   Dyslipidemia 02/09/2020   ESBL (extended spectrum beta-lactamase) producing bacteria infection 03/20/2021   a. urine culture (+) for ESBL producing E.coli and P. mirabilis   ESRD (end stage renal disease) on dialysis ([place])    a. T-Th-Sat HD initiated 02/2021   Essential hypertension    Gout    History of kidney stones    Menopause    age 42   Morbid obesity ([place])    OSA treated with BiPAP    a. on nocturnal PAP therapy; Trilogy 100   Permanent atrial fibrillation ([place])    a. Dx 10/2018 in setting of  PNA. b. CHA2DS2-VASc = 7 (sex, HFpEF, HTN, PE x2, aortic plaque, T2DM); c. s/p successful DCCV 11/2018. d. subsequent recurrent Afib; rate/rhythm maintained on oral metoprolol succinate; chronically anticoagulated using standard dose apixaban.   Personal history of radiation therapy    Pulmonary embolism ([place]) 10/2014   a. on chronic apixaban   Thyroid goiter     Social History   Socioeconomic History   Marital status: Single    Spouse name: Not on file   Number of children: Not on file   Years of education: Not on file   Highest education level: Not on file  Occupational History   Occupation: retired  Tobacco Use   Smoking status: Never    Passive exposure: Never   Smokeless tobacco: Never  Vaping Use   Vaping Use: Never used  Substance and Sexual Activity   Alcohol use: No    Alcohol/week: 0.0 standard drinks of alcohol   Drug use: No   Sexual activity: Not on file  Other Topics Concern   Not on  file  Social History Narrative   Lives in [place] by herself and retired.    Social Determinants of Health   Financial Resource Strain: Unknown (11/15/2017)   Overall Financial Resource Strain (CARDIA)    Difficulty of Paying Living Expenses: Patient refused  Food Insecurity: Unknown (11/15/2017)   Hunger Vital Sign    Worried About Running Out of Food in the Last Year: Patient refused    [place] in the Last Year: Patient refused  Transportation Needs: Unknown (11/15/2017)   [place] - Hydrologist (Medical): Patient refused    Lack of Transportation (Non-Medical): Patient refused  Physical Activity: Unknown (11/15/2017)   Exercise Vital Sign    Days of Exercise per Week: Patient refused    Minutes of Exercise per Session: Patient refused  Stress: Unknown (11/15/2017)   [place] of Stress : Patient refused  Social Connections: Unknown (11/15/2017)   Social Connection and Isolation Panel [NHANES]    Frequency of Communication with Friends and Family: Patient refused    Frequency of Social Gatherings with Friends and Family: Patient refused    Attends Religious Services: Patient refused    Active Member of Clubs or Organizations: Patient refused    Attends Archivist Meetings: Patient refused    Marital Status: Patient refused  Intimate Partner Violence: Unknown (11/15/2017)   Humiliation, Afraid, Rape, and Kick questionnaire    Fear of Current or Ex-Partner: Patient refused    Emotionally Abused: Patient refused    Physically Abused: Patient refused    Sexually Abused: Patient refused    Past Surgical History:  Procedure Laterality Date   AV FISTULA PLACEMENT Left 07/22/2021   Procedure: ARTERIOVENOUS (AV) FISTULA CREATION ([place]);  Surgeon: Katha Cabal, MD;  Location: [facility];  Service: Vascular;  Laterality: Left;   BREAST BIOPSY Right  2014   breast ca   BREAST EXCISIONAL BIOPSY Right 07/19/2012   breast ca   BREAST SURGERY Right 2014   with sentinel node bx subareaolar duct excision   CARDIOVERSION N/A 11/28/2018   Procedure: CARDIOVERSION;  Surgeon: Minna Merritts, MD;  Location: [facility];  Service: Cardiovascular;  Laterality: N/A;   COLONOSCOPY WITH PROPOFOL N/A 01/30/2017   Procedure: COLONOSCOPY WITH PROPOFOL;  Surgeon: Christene Lye, MD;  Location: [facility];  Service: Endoscopy;  Laterality: N/A;   CYSTOSCOPY W/  RETROGRADES  06/17/2021   Procedure: CYSTOSCOPY WITH RETROGRADE PYELOGRAM;  Surgeon: Billey Co, MD;  Location: [facility];  Service: Urology;;   CYSTOSCOPY W/ URETERAL STENT PLACEMENT Left 02/14/2021   Procedure: CYSTOSCOPY WITH RETROGRADE PYELOGRAM/URETERAL STENT PLACEMENT;  Surgeon: Billey Co, MD;  Location: [facility];  Service: Urology;  Laterality: Left;   CYSTOSCOPY/URETEROSCOPY/HOLMIUM LASER/STENT PLACEMENT Left 06/17/2021   Procedure: CYSTOSCOPY/URETEROSCOPY/HOLMIUM LASER/STENT EXCHANGE;  Surgeon: Billey Co, MD;  Location: [facility];  Service: Urology;  Laterality: Left;   DIALYSIS/PERMA CATHETER INSERTION N/A 02/24/2021   Procedure: DIALYSIS/PERMA CATHETER INSERTION;  Surgeon: Algernon Huxley, MD;  Location: [facility];  Service: Cardiovascular;  Laterality: N/A;    Family History  Problem Relation Age of Onset   Diabetes Father    Hypertension Father    Parkinson's disease Father    Hypertension Mother    Rheum arthritis Mother    Atrial fibrillation Mother    Heart failure Mother    Heart attack Mother        died in her mid-70's.   Colon cancer Cousin 54   Breast cancer Neg Hx     Allergies  Allergen Reactions   Spironolactone     Hyperkalemia  Pt unaware of this allergy       Latest Ref Rng & Units 07/26/2021   10:22 PM 07/22/2021    6:45 AM 06/17/2021    2:15 PM  CBC  WBC 4.0 - 10.5 K/uL 15.5     Hemoglobin 12.0 - 15.0 g/dL 12.2  13.9  14.3    Hematocrit 36.0 - 46.0 % 39.0  41.0  42.0   Platelets 150 - 400 K/uL 213         CMP     Component Value Date/Time   NA 131 (L) 07/26/2021 2222   NA 144 02/27/2020 0931   NA 141 03/09/2014 1533   K 4.2 07/26/2021 2222   K 4.2 03/09/2014 1533   CL 91 (L) 07/26/2021 2222   CL 101 03/09/2014 1533   CO2 27 07/26/2021 2222   CO2 30 03/09/2014 1533   GLUCOSE 164 (H) 07/26/2021 2222   GLUCOSE 95 03/09/2014 1533   BUN 57 (H) 07/26/2021 2222   BUN 40 (H) 02/27/2020 0931   BUN 35 (H) 03/09/2014 1533   CREATININE 5.84 (H) 07/26/2021 2222   CREATININE 1.50 (H) 03/09/2014 1533   CALCIUM 8.9 07/26/2021 2222   CALCIUM 8.8 03/09/2014 1533   PROT 7.5 07/26/2021 2222   PROT 7.3 03/09/2014 1533   ALBUMIN 3.6 07/26/2021 2222   ALBUMIN 3.5 03/09/2014 1533   AST 17 07/26/2021 2222   AST 16 03/09/2014 1533   ALT 6 07/26/2021 2222   ALT 23 03/09/2014 1533   ALKPHOS 162 (H) 07/26/2021 2222   ALKPHOS 113 03/09/2014 1533   BILITOT 0.8 07/26/2021 2222   BILITOT 0.3 03/09/2014 1533   GFRNONAA 8 (L) 07/26/2021 2222   GFRNONAA 39 (L) 03/09/2014 1533   GFRNONAA 55 (L) 07/31/2013 1521   GFRAA 35 (L) 02/27/2020 0931   GFRAA 47 (L) 03/09/2014 1533   GFRAA >60 07/31/2013 1521     No results found.     Assessment & Plan:   1. ESRD (end stage renal disease) ([place]) Recommend:  The patient is experiencing increasing problems with their dialysis access.  Patient should have a fistulagram with the intention for intervention.  The intention for intervention is to restore appropriate flow and prevent thrombosis and possible loss of the access.  As well as improve the quality of dialysis therapy.  The risks, benefits and alternative therapies were reviewed in detail with the patient.  All questions were answered.  The patient agrees to proceed with angio/intervention.    The patient will follow up with me in the office after the procedure.   - VAS US DUPLEX DIALYSIS ACCESS (AVF, AVG)   Current  Outpatient Medications on File Prior to Visit  Medication Sig Dispense Refill   acetaminophen (TYLENOL) 325 MG tablet Take 2 tablets (650 mg total) by mouth every 6 (six) hours as needed for mild pain (or Fever >/= 101).     ADVAIR DISKUS 250-50 MCG/ACT AEPB Inhale 1 puff into the lungs 2 (two) times daily.     albuterol (VENTOLIN HFA) 108 (90 Base) MCG/ACT inhaler Inhale 2 puffs into the lungs every 6 (six) hours as needed for wheezing or shortness of breath. 8.5 g 0   amLODipine (NORVASC) 5 MG tablet Take 1 tablet (5 mg total) by mouth daily. 90 tablet 3   apixaban (ELIQUIS) 5 MG TABS tablet Take 1 tablet (5 mg total) by mouth 2 (two) times daily. 180 tablet 1   atorvastatin (LIPITOR) 20 MG tablet Take 1 tablet (20 mg total) by mouth at bedtime. 90 tablet 3   calcitRIOL (ROCALTROL) 0.25 MCG capsule Take 1 capsule (0.25 mcg total) by mouth daily. 30 capsule 0   calcium carbonate (OS-CAL - DOSED IN MG OF ELEMENTAL CALCIUM) 1250 (500 Ca) MG tablet Take 1 tablet (1,250 mg total) by mouth daily with breakfast.     cloNIDine (CATAPRES) 0.2 MG tablet Take 0.2 mg by mouth 2 (two) times daily.     diclofenac Sodium (VOLTAREN) 1 % GEL Apply 2 g topically 4 (four) times daily. 100 g 0   ergocalciferol (VITAMIN D2) 1.25 MG (50000 UT) capsule Take 1 capsule (50,000 Units total) by mouth once a week. 5 capsule 0   ferric gluconate 250 mg in sodium chloride 0.9 % 250 mL Inject 250 mg into the vein Every Tuesday,Thursday,and Saturday with dialysis.     folic acid (FOLVITE) 1 MG tablet Take 1 tablet (1 mg total) by mouth daily. 30 tablet 0   gabapentin (NEURONTIN) 300 MG capsule Take 1 capsule (300 mg total) by mouth at bedtime. 30 capsule 0   HYDROcodone-acetaminophen (NORCO) 5-325 MG tablet Take 1-2 tablets by mouth every 6 (six) hours as needed. 25 tablet 0   Lancets (ONETOUCH DELICA PLUS [id number]) MISC      levothyroxine (SYNTHROID) 112 MCG tablet Take 1 tablet (112 mcg total) by mouth daily before  breakfast. 30 tablet 0   melatonin 5 MG TABS Take 2 tablets (10 mg total) by mouth at bedtime as needed (sleep). 60 tablet 0   metoprolol succinate (TOPROL-XL) 25 MG 24 hr tablet Take 1 tablet (25 mg total) by mouth daily. Take with or immediately following a meal. 90 tablet 3   midodrine (PROAMATINE) 10 MG tablet Take 1 tablet (10 mg total) by mouth Every Tuesday,Thursday,and Saturday with dialysis. 30 tablet 0   ONETOUCH ULTRA test strip      senna (SENOKOT) 8.6 MG tablet Take 1 tablet by mouth as needed for constipation.     sevelamer carbonate (RENVELA) 800 MG tablet Take 1 tablet (800 mg total) by mouth 3 (three) times daily with meals. 90 tablet 0   traMADol (ULTRAM) 50 MG tablet Take 1 tablet (50 mg total) by mouth every 12 (twelve) hours as needed for moderate pain.  30 tablet 0   TRULICITY 1.5 HC/6.2BJ SOPN Inject 1.5 mg into the skin every Monday.     No current facility-administered medications on file prior to visit.    There are no Patient Instructions on file for this visit. No follow-ups on file.   Kris Hartmann, NP

## 2021-08-20 NOTE — H&P (View-Only) (Signed)
Subjective:    Patient ID: Nicole Schmidt, female    DOB: [birth year], 56 y.o.   MRN: [id number] Chief Complaint  Patient presents with   Routine Post Op    [place] 3 week follow up    Nicole Schmidt is a 56 year old female who presents today from her dialysis center with concern about her new radiocephalic AV fistula placement.  She notes that they have been hearing a whistling in the area.  The patient was placed on 07/22/2021.  She denies any significant pain in her hand although some numbness in her thumb area.  The wound is healing well with some scabbing.  Today noninvasive studies show flow volume of 430 however there is a noted significant narrowing at the anastomosis site.  This narrowing extends approximately 1.5 cm past the anastomosis site.  Based on this during the fistula is widely patent.    Review of Systems  Skin:  Positive for wound.  All other systems reviewed and are negative.      Objective:   Physical Exam Vitals reviewed.  HENT:     Head: Normocephalic.  Cardiovascular:     Rate and Rhythm: Normal rate.     Pulses: Normal pulses.     Arteriovenous access: Left arteriovenous access is present.    Comments: Very faint thrill with whistling bruit Pulmonary:     Effort: Pulmonary effort is normal.  Skin:    General: Skin is warm and dry.  Neurological:     Mental Status: She is alert and oriented to person, place, and time.  Psychiatric:        Mood and Affect: Mood normal.        Behavior: Behavior normal.        Thought Content: Thought content normal.        Judgment: Judgment normal.     BP (!) 146/69 (BP Location: Right Arm)   Pulse 92   Resp 16   Wt (!) 345 lb 9.6 oz (156.8 kg)   LMP 07/17/2012   BMI 59.32 kg/m   Past Medical History:  Diagnosis Date   (HFpEF) heart failure with preserved ejection fraction ([place])    a. 10/2018 Echo: EF 60-65%, diast dysfxn, RVSP 50.7 mmHg; b. 01/2020 Echo: EF 65-70%, G2DD; c. 01/2021 Echo: EF 60-65%, no rwma,  mod LVH. Nl RV fxn, mildly enlarged RV. Mod BAE. Triv MR. d. 05/2021 Echo: EF 55-60; no RWMAs, mild LVH, mild TR.   Acquired hypothyroidism 02/09/2020   Anemia    Aortic atherosclerosis (HCC)    Arthritis    Breast cancer of upper-inner quadrant of right female breast ([place])    Right breast invasive CA and DCIS , 7 mm T1,N0,M0. Er/PR pos, her 2 negative.  Margins 1 mm.   COPD (chronic obstructive pulmonary disease) (HCC)    Diabetes mellitus type 2 in obese ([place]) 02/09/2020   Dyslipidemia 02/09/2020   ESBL (extended spectrum beta-lactamase) producing bacteria infection 03/20/2021   a. urine culture (+) for ESBL producing E.coli and P. mirabilis   ESRD (end stage renal disease) on dialysis ([place])    a. T-Th-Sat HD initiated 02/2021   Essential hypertension    Gout    History of kidney stones    Menopause    age 35   Morbid obesity ([place])    OSA treated with BiPAP    a. on nocturnal PAP therapy; Trilogy 100   Permanent atrial fibrillation ([place])    a. Dx 10/2018 in setting of  PNA. b. CHA2DS2-VASc = 7 (sex, HFpEF, HTN, PE x2, aortic plaque, T2DM); c. s/p successful DCCV 11/2018. d. subsequent recurrent Afib; rate/rhythm maintained on oral metoprolol succinate; chronically anticoagulated using standard dose apixaban.   Personal history of radiation therapy    Pulmonary embolism ([place]) 10/2014   a. on chronic apixaban   Thyroid goiter     Social History   Socioeconomic History   Marital status: Single    Spouse name: Not on file   Number of children: Not on file   Years of education: Not on file   Highest education level: Not on file  Occupational History   Occupation: retired  Tobacco Use   Smoking status: Never    Passive exposure: Never   Smokeless tobacco: Never  Vaping Use   Vaping Use: Never used  Substance and Sexual Activity   Alcohol use: No    Alcohol/week: 0.0 standard drinks of alcohol   Drug use: No   Sexual activity: Not on file  Other Topics Concern   Not on  file  Social History Narrative   Lives in [place] by herself and retired.    Social Determinants of Health   Financial Resource Strain: Unknown (11/15/2017)   Overall Financial Resource Strain (CARDIA)    Difficulty of Paying Living Expenses: Patient refused  Food Insecurity: Unknown (11/15/2017)   Hunger Vital Sign    Worried About Running Out of Food in the Last Year: Patient refused    [place] in the Last Year: Patient refused  Transportation Needs: Unknown (11/15/2017)   [place] - Hydrologist (Medical): Patient refused    Lack of Transportation (Non-Medical): Patient refused  Physical Activity: Unknown (11/15/2017)   Exercise Vital Sign    Days of Exercise per Week: Patient refused    Minutes of Exercise per Session: Patient refused  Stress: Unknown (11/15/2017)   [place] of Stress : Patient refused  Social Connections: Unknown (11/15/2017)   Social Connection and Isolation Panel [NHANES]    Frequency of Communication with Friends and Family: Patient refused    Frequency of Social Gatherings with Friends and Family: Patient refused    Attends Religious Services: Patient refused    Active Member of Clubs or Organizations: Patient refused    Attends Archivist Meetings: Patient refused    Marital Status: Patient refused  Intimate Partner Violence: Unknown (11/15/2017)   Humiliation, Afraid, Rape, and Kick questionnaire    Fear of Current or Ex-Partner: Patient refused    Emotionally Abused: Patient refused    Physically Abused: Patient refused    Sexually Abused: Patient refused    Past Surgical History:  Procedure Laterality Date   AV FISTULA PLACEMENT Left 07/22/2021   Procedure: ARTERIOVENOUS (AV) FISTULA CREATION ([place]);  Surgeon: Katha Cabal, MD;  Location: [facility];  Service: Vascular;  Laterality: Left;   BREAST BIOPSY Right  2014   breast ca   BREAST EXCISIONAL BIOPSY Right 07/19/2012   breast ca   BREAST SURGERY Right 2014   with sentinel node bx subareaolar duct excision   CARDIOVERSION N/A 11/28/2018   Procedure: CARDIOVERSION;  Surgeon: Minna Merritts, MD;  Location: [facility];  Service: Cardiovascular;  Laterality: N/A;   COLONOSCOPY WITH PROPOFOL N/A 01/30/2017   Procedure: COLONOSCOPY WITH PROPOFOL;  Surgeon: Christene Lye, MD;  Location: [facility];  Service: Endoscopy;  Laterality: N/A;   CYSTOSCOPY W/  RETROGRADES  06/17/2021   Procedure: CYSTOSCOPY WITH RETROGRADE PYELOGRAM;  Surgeon: Billey Co, MD;  Location: [facility];  Service: Urology;;   CYSTOSCOPY W/ URETERAL STENT PLACEMENT Left 02/14/2021   Procedure: CYSTOSCOPY WITH RETROGRADE PYELOGRAM/URETERAL STENT PLACEMENT;  Surgeon: Billey Co, MD;  Location: [facility];  Service: Urology;  Laterality: Left;   CYSTOSCOPY/URETEROSCOPY/HOLMIUM LASER/STENT PLACEMENT Left 06/17/2021   Procedure: CYSTOSCOPY/URETEROSCOPY/HOLMIUM LASER/STENT EXCHANGE;  Surgeon: Billey Co, MD;  Location: [facility];  Service: Urology;  Laterality: Left;   DIALYSIS/PERMA CATHETER INSERTION N/A 02/24/2021   Procedure: DIALYSIS/PERMA CATHETER INSERTION;  Surgeon: Algernon Huxley, MD;  Location: [facility];  Service: Cardiovascular;  Laterality: N/A;    Family History  Problem Relation Age of Onset   Diabetes Father    Hypertension Father    Parkinson's disease Father    Hypertension Mother    Rheum arthritis Mother    Atrial fibrillation Mother    Heart failure Mother    Heart attack Mother        died in her mid-70's.   Colon cancer Cousin 54   Breast cancer Neg Hx     Allergies  Allergen Reactions   Spironolactone     Hyperkalemia  Pt unaware of this allergy       Latest Ref Rng & Units 07/26/2021   10:22 PM 07/22/2021    6:45 AM 06/17/2021    2:15 PM  CBC  WBC 4.0 - 10.5 K/uL 15.5     Hemoglobin 12.0 - 15.0 g/dL 12.2  13.9  14.3    Hematocrit 36.0 - 46.0 % 39.0  41.0  42.0   Platelets 150 - 400 K/uL 213         CMP     Component Value Date/Time   NA 131 (L) 07/26/2021 2222   NA 144 02/27/2020 0931   NA 141 03/09/2014 1533   K 4.2 07/26/2021 2222   K 4.2 03/09/2014 1533   CL 91 (L) 07/26/2021 2222   CL 101 03/09/2014 1533   CO2 27 07/26/2021 2222   CO2 30 03/09/2014 1533   GLUCOSE 164 (H) 07/26/2021 2222   GLUCOSE 95 03/09/2014 1533   BUN 57 (H) 07/26/2021 2222   BUN 40 (H) 02/27/2020 0931   BUN 35 (H) 03/09/2014 1533   CREATININE 5.84 (H) 07/26/2021 2222   CREATININE 1.50 (H) 03/09/2014 1533   CALCIUM 8.9 07/26/2021 2222   CALCIUM 8.8 03/09/2014 1533   PROT 7.5 07/26/2021 2222   PROT 7.3 03/09/2014 1533   ALBUMIN 3.6 07/26/2021 2222   ALBUMIN 3.5 03/09/2014 1533   AST 17 07/26/2021 2222   AST 16 03/09/2014 1533   ALT 6 07/26/2021 2222   ALT 23 03/09/2014 1533   ALKPHOS 162 (H) 07/26/2021 2222   ALKPHOS 113 03/09/2014 1533   BILITOT 0.8 07/26/2021 2222   BILITOT 0.3 03/09/2014 1533   GFRNONAA 8 (L) 07/26/2021 2222   GFRNONAA 39 (L) 03/09/2014 1533   GFRNONAA 55 (L) 07/31/2013 1521   GFRAA 35 (L) 02/27/2020 0931   GFRAA 47 (L) 03/09/2014 1533   GFRAA >60 07/31/2013 1521     No results found.     Assessment & Plan:   1. ESRD (end stage renal disease) ([place]) Recommend:  The patient is experiencing increasing problems with their dialysis access.  Patient should have a fistulagram with the intention for intervention.  The intention for intervention is to restore appropriate flow and prevent thrombosis and possible loss of the access.  As well as improve the quality of dialysis therapy.  The risks, benefits and alternative therapies were reviewed in detail with the patient.  All questions were answered.  The patient agrees to proceed with angio/intervention.    The patient will follow up with me in the office after the procedure.   - VAS US DUPLEX DIALYSIS ACCESS (AVF, AVG)   Current  Outpatient Medications on File Prior to Visit  Medication Sig Dispense Refill   acetaminophen (TYLENOL) 325 MG tablet Take 2 tablets (650 mg total) by mouth every 6 (six) hours as needed for mild pain (or Fever >/= 101).     ADVAIR DISKUS 250-50 MCG/ACT AEPB Inhale 1 puff into the lungs 2 (two) times daily.     albuterol (VENTOLIN HFA) 108 (90 Base) MCG/ACT inhaler Inhale 2 puffs into the lungs every 6 (six) hours as needed for wheezing or shortness of breath. 8.5 g 0   amLODipine (NORVASC) 5 MG tablet Take 1 tablet (5 mg total) by mouth daily. 90 tablet 3   apixaban (ELIQUIS) 5 MG TABS tablet Take 1 tablet (5 mg total) by mouth 2 (two) times daily. 180 tablet 1   atorvastatin (LIPITOR) 20 MG tablet Take 1 tablet (20 mg total) by mouth at bedtime. 90 tablet 3   calcitRIOL (ROCALTROL) 0.25 MCG capsule Take 1 capsule (0.25 mcg total) by mouth daily. 30 capsule 0   calcium carbonate (OS-CAL - DOSED IN MG OF ELEMENTAL CALCIUM) 1250 (500 Ca) MG tablet Take 1 tablet (1,250 mg total) by mouth daily with breakfast.     cloNIDine (CATAPRES) 0.2 MG tablet Take 0.2 mg by mouth 2 (two) times daily.     diclofenac Sodium (VOLTAREN) 1 % GEL Apply 2 g topically 4 (four) times daily. 100 g 0   ergocalciferol (VITAMIN D2) 1.25 MG (50000 UT) capsule Take 1 capsule (50,000 Units total) by mouth once a week. 5 capsule 0   ferric gluconate 250 mg in sodium chloride 0.9 % 250 mL Inject 250 mg into the vein Every Tuesday,Thursday,and Saturday with dialysis.     folic acid (FOLVITE) 1 MG tablet Take 1 tablet (1 mg total) by mouth daily. 30 tablet 0   gabapentin (NEURONTIN) 300 MG capsule Take 1 capsule (300 mg total) by mouth at bedtime. 30 capsule 0   HYDROcodone-acetaminophen (NORCO) 5-325 MG tablet Take 1-2 tablets by mouth every 6 (six) hours as needed. 25 tablet 0   Lancets (ONETOUCH DELICA PLUS [id number]) MISC      levothyroxine (SYNTHROID) 112 MCG tablet Take 1 tablet (112 mcg total) by mouth daily before  breakfast. 30 tablet 0   melatonin 5 MG TABS Take 2 tablets (10 mg total) by mouth at bedtime as needed (sleep). 60 tablet 0   metoprolol succinate (TOPROL-XL) 25 MG 24 hr tablet Take 1 tablet (25 mg total) by mouth daily. Take with or immediately following a meal. 90 tablet 3   midodrine (PROAMATINE) 10 MG tablet Take 1 tablet (10 mg total) by mouth Every Tuesday,Thursday,and Saturday with dialysis. 30 tablet 0   ONETOUCH ULTRA test strip      senna (SENOKOT) 8.6 MG tablet Take 1 tablet by mouth as needed for constipation.     sevelamer carbonate (RENVELA) 800 MG tablet Take 1 tablet (800 mg total) by mouth 3 (three) times daily with meals. 90 tablet 0   traMADol (ULTRAM) 50 MG tablet Take 1 tablet (50 mg total) by mouth every 12 (twelve) hours as needed for moderate pain.  30 tablet 0   TRULICITY 1.5 ZJ/6.9CV SOPN Inject 1.5 mg into the skin every Monday.     No current facility-administered medications on file prior to visit.    There are no Patient Instructions on file for this visit. No follow-ups on file.   Kris Hartmann, NP

## 2021-08-22 DIAGNOSIS — J9601 Acute respiratory failure with hypoxia: Secondary | ICD-10-CM | POA: Diagnosis not present

## 2021-08-22 DIAGNOSIS — G4733 Obstructive sleep apnea (adult) (pediatric): Secondary | ICD-10-CM | POA: Diagnosis not present

## 2021-08-22 DIAGNOSIS — I5032 Chronic diastolic (congestive) heart failure: Secondary | ICD-10-CM | POA: Diagnosis not present

## 2021-08-22 DIAGNOSIS — I482 Chronic atrial fibrillation, unspecified: Secondary | ICD-10-CM | POA: Diagnosis not present

## 2021-08-23 ENCOUNTER — Other Ambulatory Visit: Payer: Self-pay | Admitting: *Deleted

## 2021-08-23 DIAGNOSIS — I2782 Chronic pulmonary embolism: Secondary | ICD-10-CM

## 2021-08-23 MED ORDER — APIXABAN 5 MG PO TABS: 5.0000 mg | ORAL_TABLET | Freq: Two times a day (BID) | ORAL | 1 refills | Status: DC

## 2021-08-23 NOTE — Addendum Note (Signed)
Addended by: Betti Cruz on: 08/23/2021 04:04 PM   Modules accepted: Orders

## 2021-08-24 DIAGNOSIS — I480 Paroxysmal atrial fibrillation: Secondary | ICD-10-CM | POA: Diagnosis not present

## 2021-08-24 DIAGNOSIS — I5043 Acute on chronic combined systolic (congestive) and diastolic (congestive) heart failure: Secondary | ICD-10-CM | POA: Diagnosis not present

## 2021-08-24 DIAGNOSIS — N185 Chronic kidney disease, stage 5: Secondary | ICD-10-CM | POA: Diagnosis not present

## 2021-08-24 DIAGNOSIS — Z6841 Body Mass Index (BMI) 40.0 and over, adult: Secondary | ICD-10-CM | POA: Diagnosis not present

## 2021-08-24 DIAGNOSIS — C50819 Malignant neoplasm of overlapping sites of unspecified female breast: Secondary | ICD-10-CM | POA: Diagnosis not present

## 2021-08-24 DIAGNOSIS — E1165 Type 2 diabetes mellitus with hyperglycemia: Secondary | ICD-10-CM | POA: Diagnosis not present

## 2021-08-24 DIAGNOSIS — E669 Obesity, unspecified: Secondary | ICD-10-CM | POA: Diagnosis not present

## 2021-08-24 DIAGNOSIS — J9610 Chronic respiratory failure, unspecified whether with hypoxia or hypercapnia: Secondary | ICD-10-CM | POA: Diagnosis not present

## 2021-08-26 ENCOUNTER — Telehealth (INDEPENDENT_AMBULATORY_CARE_PROVIDER_SITE_OTHER): Payer: Self-pay

## 2021-08-26 NOTE — Telephone Encounter (Signed)
Spoke with the patient and she is scheduled with Dr. Delana Meyer for a left arm fistulagram on 09/02/21 with a 11:30 am arrival time to the [facility]. Pre-procedure instructions were discussed and will be mailed.

## 2021-08-31 ENCOUNTER — Encounter: Payer: Self-pay | Admitting: Primary Care

## 2021-08-31 ENCOUNTER — Ambulatory Visit: Payer: BC Managed Care – PPO | Admitting: Primary Care

## 2021-08-31 DIAGNOSIS — J9621 Acute and chronic respiratory failure with hypoxia: Secondary | ICD-10-CM

## 2021-08-31 DIAGNOSIS — J984 Other disorders of lung: Secondary | ICD-10-CM | POA: Diagnosis not present

## 2021-08-31 DIAGNOSIS — J9611 Chronic respiratory failure with hypoxia: Secondary | ICD-10-CM | POA: Diagnosis not present

## 2021-08-31 NOTE — Patient Instructions (Signed)
Very nice seeing you today Ms Streck, you are looking very well  Recommendations: Continue to wear Trilogy vent at night or with naps Continue to wear oxygen at bedtime Use Advair 1 puff every 12 hours and albuterol as needed for breakthrough symptoms   Follow-up: 3 months with either Dr. Halford Chessman or Eustaquio Maize NP

## 2021-08-31 NOTE — Progress Notes (Signed)
$'@Patient'f$  ID: Nicole Schmidt, female    DOB: 1966/01/10, 56 y.o.   MRN: [id number]  Chief Complaint  Patient presents with   Follow-up    Wearing trilogy avg 5hr nightly.      Referring provider: Lenard Simmer, MD  HPI: 56 year old female, never smoked.  Past medical history significant for OSA/OHS, chronic respiratory failure, hx PE.  Patient of Dr. Halford Schmidt, last seen by pulmonary nurse practitioner on 01/13/2021.  Recent hospital/ED course:  Patient was admitted on 03/28/2021 until 04/15/2021 for debility related to chronic respiratory failure superimposed with diastolic congestive heart failure.  He was previously admitted back in December for management of acute on chronic diastolic heart failure and end-stage renal disease on hemodialysis with permacath placed on 02/25/2021.  Patient presented to [facility] on 03/17/2021 with inability to ambulate and lower extremity swelling.  She became more somnolent unable to follow commands.  ABG showed pH 7.14/PCO2 89/bicarb 30.3 and was placed on BiPAP.  Chest x-ray showed bilateral pulmonary opacities that were nonspecific.  Follow-up chest x-ray on 03/22/2021 showed stable mild pulm edema.  Urine culture was positive for ESBL protease E. coli treated empirically with 1 dose fosfomycin.  Patient was admitted to rehab on 03/28/2021 for inpatient therapies.  She was reevaluated in the emergency room on 04/24/2021 for ACUTE hypoxia/NSTEMI.  Patient family member found her in her wheelchair sleep without trilogy ventilator.  She was confused upon waking.  Family called EMS.  EKG showed A-fib, no ST elevation or T wave inversions.  BG showed elevated CO2 but only slightly, pH was normal.  Patient did not require BiPAP.  Troponins uptrending.  Felt to be demand from acute respiratory failure.  Heparin drip was not started as patient was already on Eliquis and not actively having chest pain. Discharged.   06/06/2021 Patient presents today for hospital follow-up. She is doing  well. No acute respiratory symptoms. Her breathing is good. She is compliant with Advair twice daily. She has not needed to use her Albuterol rescue inhaler in several weeks. She came in with her oxygen but her tanks were empty. She is 95% on RA. Ambulates with walker. Using Trilogy every night and while napping. Finds it much easier to use than BIPAP. She sleeps in a recliner currently. There has only been one time where she took her Trilogy mask off that she is aware of and she was admitted to the ED for 26 hours for possible NSTEMI. She continues dialysis tues, thurs, sat. She thinks they took off 3.5L last dialysis round. She is following fluid restriction.   08/31/2021- interim hx  Patient presents today for 6-8 week follow-up. She is compliant with Trilogy NIV at night and with naps. She prefers Trilogy NIV to CPAP. She does not have to fight wearing it. Sleeping well at night. Not waking up as frequently. She takes machine with her to dialysis. Breathing is stable. Wearing 2L oxygen as needed with exertion and at night. She has not needed oxygen during the day time recently. She brings rescue inhaler with her everywhere but has not needed to use it. She is doing more in the last week. New cane. Continues Advair 250-22mg one puffs twice daily and prn albuterol. She will forget to use Advair at times. PFTs in June 2023 showed moderate restrictive lung disease, recommend weight loss. She has been approved for disability. Continues dialysis.   Pulmonary function testing: 08/12/21>> FVC 1.89(55%), FEV 1.58 (60%), ratio 84, TLC 85%, DLCO 20.47 (78%)  Allergies  Allergen Reactions   Spironolactone     Hyperkalemia  Pt unaware of this allergy    Immunization History  Administered Date(s) Administered   Influenza-Unspecified 11/05/2018   PFIZER(Purple Top)SARS-COV-2 Vaccination 05/19/2019, 06/16/2019, 02/18/2020   Pfizer Covid-19 Vaccine Bivalent Booster 32yr & up 11/22/2020   Pneumococcal  Polysaccharide-23 02/16/2020    Past Medical History:  Diagnosis Date   (HFpEF) heart failure with preserved ejection fraction ([facility]    a. 10/2018 Echo: EF 60-65%, diast dysfxn, RVSP 50.7 mmHg; b. 01/2020 Echo: EF 65-70%, G2DD; c. 01/2021 Echo: EF 60-65%, no rwma, mod LVH. Nl RV fxn, mildly enlarged RV. Mod BAE. Triv MR. d. 05/2021 Echo: EF 55-60; no RWMAs, mild LVH, mild TR.   Acquired hypothyroidism 02/09/2020   Anemia    Aortic atherosclerosis (HCC)    Arthritis    Breast cancer of upper-inner quadrant of right female breast ([facility]    Right breast invasive CA and DCIS , 7 mm T1,N0,M0. Er/PR pos, her 2 negative.  Margins 1 mm.   COPD (chronic obstructive pulmonary disease) (HCC)    Diabetes mellitus type 2 in obese ([facility] 02/09/2020   Dyslipidemia 02/09/2020   ESBL (extended spectrum beta-lactamase) producing bacteria infection 03/20/2021   a. urine culture (+) for ESBL producing E.coli and P. mirabilis   ESRD (end stage renal disease) on dialysis ([facility]    a. T-Th-Sat HD initiated 02/2021   Essential hypertension    Gout    History of kidney stones    Menopause    age 56  Morbid obesity ([facility]    OSA treated with BiPAP    a. on nocturnal PAP therapy; Trilogy 100   Permanent atrial fibrillation ([facility]    a. Dx 10/2018 in setting of PNA. b. CHA2DS2-VASc = 7 (sex, HFpEF, HTN, PE x2, aortic plaque, T2DM); c. s/p successful DCCV 11/2018. d. subsequent recurrent Afib; rate/rhythm maintained on oral metoprolol succinate; chronically anticoagulated using standard dose apixaban.   Personal history of radiation therapy    Pulmonary embolism ([facility] 10/2014   a. on chronic apixaban   Thyroid goiter     Tobacco History: Social History   Tobacco Use  Smoking Status Never   Passive exposure: Never  Smokeless Tobacco Never   Counseling given: Not Answered   Outpatient Medications Prior to Visit  Medication Sig Dispense Refill   acetaminophen (TYLENOL) 325 MG tablet Take 2 tablets (650 mg  total) by mouth every 6 (six) hours as needed for mild pain (or Fever >/= 101). (Patient not taking: Reported on 09/02/2021)     ADVAIR DISKUS 250-50 MCG/ACT AEPB Inhale 1 puff into the lungs 2 (two) times daily.     albuterol (VENTOLIN HFA) 108 (90 Base) MCG/ACT inhaler Inhale 2 puffs into the lungs every 6 (six) hours as needed for wheezing or shortness of breath. 8.5 g 0   amLODipine (NORVASC) 5 MG tablet Take 1 tablet (5 mg total) by mouth daily. 90 tablet 3   apixaban (ELIQUIS) 5 MG TABS tablet Take 1 tablet (5 mg total) by mouth 2 (two) times daily. 180 tablet 1   atorvastatin (LIPITOR) 20 MG tablet Take 1 tablet (20 mg total) by mouth at bedtime. 90 tablet 3   calcitRIOL (ROCALTROL) 0.25 MCG capsule Take 1 capsule (0.25 mcg total) by mouth daily. 30 capsule 0   calcium carbonate (OS-CAL - DOSED IN MG OF ELEMENTAL CALCIUM) 1250 (500 Ca) MG tablet Take 1 tablet (1,250 mg total) by mouth daily with breakfast.  cloNIDine (CATAPRES) 0.2 MG tablet Take 0.2 mg by mouth 2 (two) times daily.     diclofenac Sodium (VOLTAREN) 1 % GEL Apply 2 g topically 4 (four) times daily. (Patient not taking: Reported on 09/02/2021) 100 g 0   ergocalciferol (VITAMIN D2) 1.25 MG (50000 UT) capsule Take 1 capsule (50,000 Units total) by mouth once a week. 5 capsule 0   ferric gluconate 250 mg in sodium chloride 0.9 % 250 mL Inject 250 mg into the vein Every Tuesday,Thursday,and Saturday with dialysis.     folic acid (FOLVITE) 1 MG tablet Take 1 tablet (1 mg total) by mouth daily. 30 tablet 0   gabapentin (NEURONTIN) 300 MG capsule Take 1 capsule (300 mg total) by mouth at bedtime. 30 capsule 0   HYDROcodone-acetaminophen (NORCO) 5-325 MG tablet Take 1-2 tablets by mouth every 6 (six) hours as needed. (Patient not taking: Reported on 09/02/2021) 25 tablet 0   Lancets (ONETOUCH DELICA PLUS [id number]) MISC      levothyroxine (SYNTHROID) 112 MCG tablet Take 1 tablet (112 mcg total) by mouth daily before breakfast. 30 tablet  0   melatonin 5 MG TABS Take 2 tablets (10 mg total) by mouth at bedtime as needed (sleep). 60 tablet 0   metoprolol succinate (TOPROL-XL) 25 MG 24 hr tablet Take 1 tablet (25 mg total) by mouth daily. Take with or immediately following a meal. 90 tablet 3   midodrine (PROAMATINE) 10 MG tablet Take 1 tablet (10 mg total) by mouth Every Tuesday,Thursday,and Saturday with dialysis. 30 tablet 0   ONETOUCH ULTRA test strip      senna (SENOKOT) 8.6 MG tablet Take 1 tablet by mouth as needed for constipation. (Patient not taking: Reported on 09/02/2021)     sevelamer carbonate (RENVELA) 800 MG tablet Take 1 tablet (800 mg total) by mouth 3 (three) times daily with meals. 90 tablet 0   traMADol (ULTRAM) 50 MG tablet Take 1 tablet (50 mg total) by mouth every 12 (twelve) hours as needed for moderate pain. 30 tablet 0   TRULICITY 1.5 YP/9.5KD SOPN Inject 1.5 mg into the skin every Monday.     No facility-administered medications prior to visit.      Review of Systems  Review of Systems  Constitutional: Negative.   HENT: Negative.    Respiratory: Negative.    Cardiovascular: Negative.      Physical Exam  BP 126/74 (BP Location: Right Arm, Cuff Size: Large)   Pulse 89   Temp 97.9 F (36.6 C) (Temporal)   Ht '5\' 4"'$  (1.626 m)   Wt (!) 344 lb 9.6 oz (156.3 kg)   LMP 07/17/2012   SpO2 96%   BMI 59.15 kg/m  Physical Exam Constitutional:      Appearance: Normal appearance. She is obese.  HENT:     Head: Normocephalic and atraumatic.  Cardiovascular:     Rate and Rhythm: Normal rate and regular rhythm.  Pulmonary:     Effort: Pulmonary effort is normal.     Breath sounds: Normal breath sounds.  Skin:    General: Skin is warm and dry.  Neurological:     General: No focal deficit present.     Mental Status: She is alert and oriented to person, place, and time. Mental status is at baseline.  Psychiatric:        Mood and Affect: Mood normal.        Behavior: Behavior normal.         Thought Content: Thought content  normal.        Judgment: Judgment normal.      Lab Results:  CBC    Component Value Date/Time   WBC 15.5 (H) 07/26/2021 2222   RBC 4.04 07/26/2021 2222   HGB 12.2 07/26/2021 2222   HGB 9.9 (L) 02/27/2020 0931   HCT 39.0 07/26/2021 2222   HCT 32.2 (L) 02/27/2020 0931   PLT 213 07/26/2021 2222   PLT 232 02/27/2020 0931   MCV 96.5 07/26/2021 2222   MCV 89 02/27/2020 0931   MCV 90 03/09/2014 1533   MCH 30.2 07/26/2021 2222   MCHC 31.3 07/26/2021 2222   RDW 17.5 (H) 07/26/2021 2222   RDW 13.0 02/27/2020 0931   RDW 15.2 (H) 03/09/2014 1533   LYMPHSABS 1.3 07/26/2021 2222   LYMPHSABS 1.5 03/09/2014 1533   MONOABS 1.0 07/26/2021 2222   MONOABS 0.6 03/09/2014 1533   EOSABS 0.2 07/26/2021 2222   EOSABS 0.2 03/09/2014 1533   BASOSABS 0.1 07/26/2021 2222   BASOSABS 0.1 03/09/2014 1533    BMET    Component Value Date/Time   NA 131 (L) 07/26/2021 2222   NA 144 02/27/2020 0931   NA 141 03/09/2014 1533   K 4.2 07/26/2021 2222   K 4.2 03/09/2014 1533   CL 91 (L) 07/26/2021 2222   CL 101 03/09/2014 1533   CO2 27 07/26/2021 2222   CO2 30 03/09/2014 1533   GLUCOSE 164 (H) 07/26/2021 2222   GLUCOSE 95 03/09/2014 1533   BUN 57 (H) 07/26/2021 2222   BUN 40 (H) 02/27/2020 0931   BUN 35 (H) 03/09/2014 1533   CREATININE 5.84 (H) 07/26/2021 2222   CREATININE 1.50 (H) 03/09/2014 1533   CALCIUM 8.9 07/26/2021 2222   CALCIUM 8.8 03/09/2014 1533   GFRNONAA 8 (L) 07/26/2021 2222   GFRNONAA 39 (L) 03/09/2014 1533   GFRNONAA 55 (L) 07/31/2013 1521   GFRAA 35 (L) 02/27/2020 0931   GFRAA 47 (L) 03/09/2014 1533   GFRAA >60 07/31/2013 1521    BNP    Component Value Date/Time   BNP 196.5 (H) 06/06/2021 1329    ProBNP No results found for: "PROBNP"  Imaging: PERIPHERAL VASCULAR CATHETERIZATION  Result Date: 09/02/2021 See surgical note for result.  VAS US DUPLEX DIALYSIS ACCESS (AVF, AVG)  Result Date: 08/22/2021 DIALYSIS ACCESS Patient Name:   Nicole Schmidt  Date of Exam:   08/19/2021 Medical Rec #: [id number]       Accession #:    [id number] Date of Birth: 1965/08/28        Patient Gender: F Patient Age:   30 years Exam Location:  [facility] Procedure:      VAS US DUPLEX DIALYSIS ACCESS (AVF, AVG) Referring Phys: [name] --------------------------------------------------------------------------------  Reason for Exam: First look. Access Site: Left Upper Extremity. Access Type: Radial-cephalic AVF. History: Created 07/22/2021. Performing Technologist: Concha Norway RVT  Examination Guidelines: A complete evaluation includes B-mode imaging, spectral Doppler, color Doppler, and power Doppler as needed of all accessible portions of each vessel. Unilateral testing is considered an integral part of a complete examination. Limited examinations for reoccurring indications may be performed as noted.  Findings: +--------------------+----------+-----------------+--------------+ AVF                 PSV (cm/s)Flow Vol (mL/min)   Comments    +--------------------+----------+-----------------+--------------+ Native artery inflow   240           430                      +--------------------+----------+-----------------+--------------+  AVF Anastomosis        515                     .10cm diameter +--------------------+----------+-----------------+--------------+  +---------------+----------+-------------+----------+--------+ OUTFLOW VEIN   PSV (cm/s)Diameter (cm)Depth (cm)Describe +---------------+----------+-------------+----------+--------+ Subclavian vein   108                                    +---------------+----------+-------------+----------+--------+ Confluence        121                                    +---------------+----------+-------------+----------+--------+ Prox UA            68                                    +---------------+----------+-------------+----------+--------+ Mid UA              48                                    +---------------+----------+-------------+----------+--------+ Dist UA            45                                    +---------------+----------+-------------+----------+--------+ AC Fossa           30                                    +---------------+----------+-------------+----------+--------+ Prox Forearm      104                                    +---------------+----------+-------------+----------+--------+ Mid Forearm        71                                    +---------------+----------+-------------+----------+--------+ Dist Forearm       45                                    +---------------+----------+-------------+----------+--------+   Summary: New patent left radiocephalic AVF with severe narrowing (.10cm) and tortuosity of the anastomosis site. Narrowing extends approximately 1.5cm past anastomosis site. Cephalic vein AVF remains patent to confluence.  *See table(s) above for measurements and observations.  Diagnosing physician: [name] MD Electronically signed by [name] MD on 08/22/2021 at 5:22:17 PM.   --------------------------------------------------------------------------------   Final    Pulmonary Function Test [facility] Only  Result Date: 08/15/2021 Spirometry Data Is Acceptable and Reproducible Moderate Restrictive Lung disease Recommend Weight loss Consider outpatient Pulmonary Consultation if needed Clinical Correlation Advised     Assessment & Plan:   Chronic respiratory failure with hypoxia ([place]) - Patient is compliant with Trilogy use, prefer NIV to BIPAP. Sleeping well at night.   Acute on chronic respiratory failure  with hypoxemia ([place]) - Continue to wear 2L oxygen as needed with exertion to maintain O2 >88-90% and at bedtime with TRILOGY VENTILATOR   Restrictive lung disease - Stale; NO acute respiratory symptoms or recent exacerbations. No SABA use. PFTs in June 2023 showed  moderate restrictive lung disease. Continue to Advair 250-84mg 1 puff every 12 hours and albuterol as needed for breakthrough symptoms. Encourage weight loss. FU in 3 months or sooner if needed      EMartyn Ehrich NP 09/05/2021

## 2021-09-02 ENCOUNTER — Other Ambulatory Visit: Payer: Self-pay

## 2021-09-02 ENCOUNTER — Encounter
Admission: RE | Disposition: A | Discharge status: Home / Self Care | Payer: Self-pay | Source: Ambulatory Visit | Attending: Vascular Surgery

## 2021-09-02 ENCOUNTER — Ambulatory Visit
Admission: RE | Admit: 2021-09-02 | Discharge: 2021-09-02 | Disposition: A | Discharge status: Home / Self Care | Payer: BC Managed Care – PPO | Source: Ambulatory Visit | Attending: Vascular Surgery | Admitting: Vascular Surgery

## 2021-09-02 ENCOUNTER — Encounter: Payer: Self-pay | Admitting: Vascular Surgery

## 2021-09-02 DIAGNOSIS — Z7985 Long-term (current) use of injectable non-insulin antidiabetic drugs: Secondary | ICD-10-CM | POA: Diagnosis not present

## 2021-09-02 DIAGNOSIS — Y841 Kidney dialysis as the cause of abnormal reaction of the patient, or of later complication, without mention of misadventure at the time of the procedure: Secondary | ICD-10-CM | POA: Insufficient documentation

## 2021-09-02 DIAGNOSIS — T82898A Other specified complication of vascular prosthetic devices, implants and grafts, initial encounter: Secondary | ICD-10-CM | POA: Diagnosis not present

## 2021-09-02 DIAGNOSIS — I132 Hypertensive heart and chronic kidney disease with heart failure and with stage 5 chronic kidney disease, or end stage renal disease: Secondary | ICD-10-CM | POA: Diagnosis not present

## 2021-09-02 DIAGNOSIS — Z992 Dependence on renal dialysis: Secondary | ICD-10-CM | POA: Diagnosis not present

## 2021-09-02 DIAGNOSIS — E1122 Type 2 diabetes mellitus with diabetic chronic kidney disease: Secondary | ICD-10-CM | POA: Diagnosis not present

## 2021-09-02 DIAGNOSIS — I5032 Chronic diastolic (congestive) heart failure: Secondary | ICD-10-CM | POA: Diagnosis not present

## 2021-09-02 DIAGNOSIS — N186 End stage renal disease: Secondary | ICD-10-CM | POA: Insufficient documentation

## 2021-09-02 HISTORY — PX: A/V FISTULAGRAM: CATH118298

## 2021-09-02 LAB — POTASSIUM (ARMC VASCULAR LAB ONLY): Potassium (ARMC vascular lab): 4.4 mmol/L (ref 3.5–5.1)

## 2021-09-02 LAB — GLUCOSE, CAPILLARY: Glucose-Capillary: 127 mg/dL — ABNORMAL HIGH (ref 70–99)

## 2021-09-02 SURGERY — A/V FISTULAGRAM
Anesthesia: Moderate Sedation | Laterality: Left

## 2021-09-02 MED ORDER — HEPARIN SODIUM (PORCINE) 1000 UNIT/ML IJ SOLN
INTRAMUSCULAR | Status: DC | PRN
  Administered 2021-09-02: 4000 [IU] via INTRAVENOUS

## 2021-09-02 MED ORDER — HEPARIN SODIUM (PORCINE) 1000 UNIT/ML IJ SOLN
INTRAMUSCULAR | Status: AC
  Filled 2021-09-02: qty 10

## 2021-09-02 MED ORDER — FENTANYL CITRATE (PF) 100 MCG/2ML IJ SOLN
INTRAMUSCULAR | Status: DC | PRN
  Administered 2021-09-02 (×3): 25 ug via INTRAVENOUS
  Administered 2021-09-02: 50 ug via INTRAVENOUS

## 2021-09-02 MED ORDER — MIDAZOLAM HCL 2 MG/2ML IJ SOLN
INTRAMUSCULAR | Status: DC | PRN
  Administered 2021-09-02 (×2): .5 mg via INTRAVENOUS
  Administered 2021-09-02: 2 mg via INTRAVENOUS
  Administered 2021-09-02: .5 mg via INTRAVENOUS

## 2021-09-02 MED ORDER — FENTANYL CITRATE PF 50 MCG/ML IJ SOSY
PREFILLED_SYRINGE | INTRAMUSCULAR | Status: AC
  Filled 2021-09-02: qty 1

## 2021-09-02 MED ORDER — CEFAZOLIN SODIUM-DEXTROSE 1-4 GM/50ML-% IV SOLN
INTRAVENOUS | Status: DC | PRN
  Administered 2021-09-02: 1 g via INTRAVENOUS

## 2021-09-02 MED ORDER — HYDROMORPHONE HCL 1 MG/ML IJ SOLN: 1.0000 mg | Freq: Once | INTRAMUSCULAR | Status: DC | PRN

## 2021-09-02 MED ORDER — FAMOTIDINE 20 MG PO TABS: 40.0000 mg | ORAL_TABLET | Freq: Once | ORAL | Status: DC | PRN

## 2021-09-02 MED ORDER — METHYLPREDNISOLONE SODIUM SUCC 125 MG IJ SOLR: 125.0000 mg | Freq: Once | INTRAMUSCULAR | Status: DC | PRN

## 2021-09-02 MED ORDER — FENTANYL CITRATE (PF) 100 MCG/2ML IJ SOLN
INTRAMUSCULAR | Status: AC
  Filled 2021-09-02: qty 2

## 2021-09-02 MED ORDER — MIDAZOLAM HCL 5 MG/5ML IJ SOLN
INTRAMUSCULAR | Status: AC
  Filled 2021-09-02: qty 5

## 2021-09-02 MED ORDER — DIPHENHYDRAMINE HCL 50 MG/ML IJ SOLN: 50.0000 mg | Freq: Once | INTRAMUSCULAR | Status: DC | PRN

## 2021-09-02 MED ORDER — ONDANSETRON HCL 4 MG/2ML IJ SOLN
4.0000 mg | Freq: Four times a day (QID) | INTRAMUSCULAR | Status: DC | PRN
Start: 2021-09-02 — End: 2021-09-02

## 2021-09-02 MED ORDER — SODIUM CHLORIDE 0.9 % IV SOLN: INTRAVENOUS | Status: DC

## 2021-09-02 MED ORDER — CEFAZOLIN SODIUM-DEXTROSE 1-4 GM/50ML-% IV SOLN: 1.0000 g | INTRAVENOUS | Status: DC

## 2021-09-02 MED ORDER — MIDAZOLAM HCL 2 MG/ML PO SYRP: 8.0000 mg | ORAL_SOLUTION | Freq: Once | ORAL | Status: DC | PRN

## 2021-09-02 SURGICAL SUPPLY — 28 items
BALLN LUTONIX  018 4X60X130 (BALLOONS) ×1
BALLN LUTONIX 018 4X60X130 (BALLOONS) ×1
BALLN LUTONIX 018 5X40X130 (BALLOONS) ×2
BALLN ULTRV 018 3X100X75 (BALLOONS) ×2
BALLOON LUTONIX 018 4X60X130 (BALLOONS) IMPLANT
BALLOON LUTONIX 018 5X40X130 (BALLOONS) IMPLANT
BALLOON ULTRV 018 3X100X75 (BALLOONS) IMPLANT
CATH BEACON 5 .035 40 KMP TP (CATHETERS) IMPLANT
CATH BEACON 5 .038 40 KMP TP (CATHETERS) ×1
CATH MICROCATH PRGRT 2.8F 110 (CATHETERS) IMPLANT
COIL 400 COMPLEX SOFT 4X6CM (Vascular Products) ×2 IMPLANT
COIL 400 COMPLEX STD 4X10CM (Vascular Products) ×1 IMPLANT
COVER PROBE U/S 5X48 (MISCELLANEOUS) ×1 IMPLANT
DEVICE TORQUE (MISCELLANEOUS) ×1 IMPLANT
DRAPE BRACHIAL (DRAPES) ×1 IMPLANT
GLIDECATH 4FR STR (CATHETERS) ×1 IMPLANT
GUIDEWIRE ADV .018X180CM (WIRE) ×1 IMPLANT
GUIDEWIRE ANGLED .035 180CM (WIRE) ×1 IMPLANT
HANDLE DETACHMENT COIL (MISCELLANEOUS) ×1 IMPLANT
KIT ENCORE 26 ADVANTAGE (KITS) ×1 IMPLANT
MICROCATH PROGREAT 2.8F 110 CM (CATHETERS) ×2
NDL ENTRY 21GA 7CM ECHOTIP (NEEDLE) IMPLANT
NEEDLE ENTRY 21GA 7CM ECHOTIP (NEEDLE) ×2 IMPLANT
PACK ANGIOGRAPHY (CUSTOM PROCEDURE TRAY) ×2 IMPLANT
SET INTRO CAPELLA COAXIAL (SET/KITS/TRAYS/PACK) ×1 IMPLANT
SHEATH BRITE TIP 4FRX11 (SHEATH) ×1 IMPLANT
SHEATH BRITE TIP 5FRX11 (SHEATH) ×1 IMPLANT
SUT MNCRL AB 4-0 PS2 18 (SUTURE) ×1 IMPLANT

## 2021-09-02 NOTE — Op Note (Signed)
OPERATIVE NOTE   PROCEDURE: Contrast injection left radiocephalic AV access Percutaneous transluminal angioplasty to 5 mm left radiocephalic fistula at the arterial anastomosis peripheral segment. Coil embolization of a branch of the cephalic vein to improve flow  PRE-OPERATIVE DIAGNOSIS: Complication of dialysis access with failure to mature and multiple branches stealing flow from the cephalic vein preventing maturation.                                                         End Stage Renal Disease  POST-OPERATIVE DIAGNOSIS: same as above   SURGEON: Katha Cabal, M.D.  ANESTHESIA: Conscious sedation was administered under my direct supervision by the interventional radiology RN. IV Versed plus fentanyl were utilized. Continuous ECG, pulse oximetry and blood pressure was monitored throughout the entire procedure.  Conscious sedation was for a total of 61 minutes.  ESTIMATED BLOOD LOSS: minimal  FINDING(S): Stricture of the AV graft  SPECIMEN(S):  None  CONTRAST: 50 cc  FLUOROSCOPY TIME: 9.2 minutes  INDICATIONS: Nicole Schmidt is a 56 y.o. female who  presents with malfunctioning left wrist AV access.  The patient is scheduled for angiography with possible intervention of the AV access.  The patient is aware the risks include but are not limited to: bleeding, infection, thrombosis of the cannulated access, and possible anaphylactic reaction to the contrast.  The patient acknowledges if the access can not be salvaged a tunneled catheter will be needed and will be placed during this procedure.  The patient is aware of the risks of the procedure and elects to proceed with the angiogram and intervention.  DESCRIPTION: After full informed written consent was obtained, the patient was brought back to the Special Procedure suite and placed supine position.  Appropriate cardiopulmonary monitors were placed.  The left arm was prepped and draped in the standard fashion.  Appropriate  timeout is called. The left radiocephalic fistula was cannulated with a micropuncture needle more proximally on the forearm in a retrograde direction (the duplex ultrasound has demonstrated a stricture near the arterial anastomosis as well as multiple side branches which appear to be defusing the flow away from the cephalic vein).  Cannulation was performed with ultrasound guidance. Ultrasound was placed in a sterile sleeve, the AV access was interrogated and noted to be echolucent and compressible indicating patency. Image was recorded for the permanent record. The puncture is performed under continuous ultrasound visualization.   The microwire was advanced and the needle was exchanged for  a microsheath.  The J-wire was then advanced and a 4 Fr sheath inserted later in the case the sheath was upsized to a 5 [place] sheath.  Hand injections were completed to image the access from the arterial anastomosis through the entire access.    Based on the images,  4000 units of heparin was given and a wire was negotiated through the strictures and out into the distal radial artery.  An 3 mm x 100 mm Ultraverse balloon was used.  Inflation was to 10 atm for 1 minute.  Follow-up imaging using a Kumpe catheter advanced over the wire to the level of the arterial anastomosis demonstrated improvement but still significant undersized and therefore the wire was reintroduced and a 4 mm x 60 mm Lutonix drug-eluting balloon was advanced across the stricture inflation was to approximately 6  atm for 1 minute.  Lastly a 5 mm x 40 mm balloon was advanced across the 1 remaining area that did not fully dilate inflation was to 4 atm for 1 minute.  Follow-up imaging now demonstrated less than 20% residual stenosis throughout the cephalic vein with good flow through the arterial anastomosis.  A prograde catheter was then introduced through the Kumpe and the largest dominant sidebranch was selected this actually had a short trunk with 2  large branches.  Using the prograde catheter I was able to select the first larger branch and deployed a 4 mm x 10 mm Ruby coil.  I then placed a 4 mm x 6 mm soft coil.  Follow-up imaging through the prograde catheter then showed the origin of the second large branch which I was able to cross with a third 4 mm x 6 mm soft coil completely embolizing both of these branches.  Follow-up imaging now demonstrated a dramatic reduction in the flow through tributaries and excellent flow through the cephalic vein.  A 4-0 Monocryl purse-string suture was sewn around the sheath.  The sheath was removed and light pressure was applied.  A sterile bandage was applied to the puncture site.    COMPLICATIONS: None  CONDITION: Nicole Schmidt, M.D [facility] Office: [phone]  09/02/2021 2:17 PM

## 2021-09-02 NOTE — Interval H&P Note (Signed)
History and Physical Interval Note:  09/02/2021 12:00 PM  Nicole Schmidt  has presented today for surgery, with the diagnosis of L arm fistulagram   End Stage Renal.  The various methods of treatment have been discussed with the patient and family. After consideration of risks, benefits and other options for treatment, the patient has consented to  Procedure(s): A/V Fistulagram (Left) as a surgical intervention.  The patient's history has been reviewed, patient examined, no change in status, stable for surgery.  I have reviewed the patient's chart and labs.  Questions were answered to the patient's satisfaction.     [name]

## 2021-09-05 ENCOUNTER — Telehealth (INDEPENDENT_AMBULATORY_CARE_PROVIDER_SITE_OTHER): Payer: Self-pay

## 2021-09-05 ENCOUNTER — Encounter: Payer: Self-pay | Admitting: Vascular Surgery

## 2021-09-05 NOTE — Assessment & Plan Note (Signed)
-   Patient is compliant with Trilogy use, prefer NIV to BIPAP. Sleeping well at night.

## 2021-09-05 NOTE — Telephone Encounter (Signed)
Patient left a message regarding replacing her bandage and having stitches taken out. I advised that per her discharge summary she would only need a bandage if she had drainage and as far as the stitches that can be addressed at her 3 week follow up appt.

## 2021-09-05 NOTE — Assessment & Plan Note (Signed)
-   Continue to wear 2L oxygen as needed with exertion to maintain O2 >88-90% and at bedtime with TRILOGY VENTILATOR

## 2021-09-05 NOTE — Assessment & Plan Note (Addendum)
-   Stale; NO acute respiratory symptoms or recent exacerbations. No SABA use. PFTs in June 2023 showed moderate restrictive lung disease. Continue to Advair 250-74mg 1 puff every 12 hours and albuterol as needed for breakthrough symptoms. Encourage weight loss. FU in 3 months or sooner if needed

## 2021-09-15 NOTE — Progress Notes (Signed)
Reviewed and agree with assessment/plan.   Chesley Mires, MD [facility] Pulmonary/Critical Care 09/15/2021, 9:52 AM Pager:  [phone]

## 2021-09-16 ENCOUNTER — Telehealth (INDEPENDENT_AMBULATORY_CARE_PROVIDER_SITE_OTHER): Payer: Self-pay

## 2021-09-16 NOTE — Telephone Encounter (Signed)
Spoke with the patient and she is scheduled with Dr. Delana Meyer for a left arm thrombectomy with 09/20/21 and a 2:00 pm arrival time to the MM. Pre-procedure instructions were discussed and patient stated she understood. Prior Josem Kaufmann has been requested and I am waiting on an answer from her insurance. This appt may need to be moved due to insurance.

## 2021-09-18 DIAGNOSIS — E662 Morbid (severe) obesity with alveolar hypoventilation: Secondary | ICD-10-CM | POA: Diagnosis not present

## 2021-09-20 ENCOUNTER — Telehealth (INDEPENDENT_AMBULATORY_CARE_PROVIDER_SITE_OTHER): Payer: Self-pay

## 2021-09-20 DIAGNOSIS — N186 End stage renal disease: Secondary | ICD-10-CM

## 2021-09-20 NOTE — Telephone Encounter (Signed)
Patient called to let me know she had been rescheduled to 09/21/21 because she was not called regarding the time change of her procedure with Dr. Delana Meyer on 09/20/21. Patient was scheduled for a 2:00 pm arrival time due to moving her dialysis time up to accommodate the day for her procedure. Patient has been rescheduled from 09/21/21 because she will be put to sleep and is unsure about how she will respond so she requested to be moved to 09/28/21 with a 11:00 am arrival time to the MM. Patient stated she understood all pre-procedure instructions.

## 2021-09-22 DIAGNOSIS — G4733 Obstructive sleep apnea (adult) (pediatric): Secondary | ICD-10-CM | POA: Diagnosis not present

## 2021-09-22 DIAGNOSIS — I5032 Chronic diastolic (congestive) heart failure: Secondary | ICD-10-CM | POA: Diagnosis not present

## 2021-09-22 DIAGNOSIS — J9601 Acute respiratory failure with hypoxia: Secondary | ICD-10-CM | POA: Diagnosis not present

## 2021-09-22 DIAGNOSIS — I482 Chronic atrial fibrillation, unspecified: Secondary | ICD-10-CM | POA: Diagnosis not present

## 2021-09-26 DIAGNOSIS — E041 Nontoxic single thyroid nodule: Secondary | ICD-10-CM | POA: Diagnosis not present

## 2021-09-26 DIAGNOSIS — E785 Hyperlipidemia, unspecified: Secondary | ICD-10-CM | POA: Diagnosis not present

## 2021-09-26 DIAGNOSIS — R609 Edema, unspecified: Secondary | ICD-10-CM | POA: Diagnosis not present

## 2021-09-26 DIAGNOSIS — I10 Essential (primary) hypertension: Secondary | ICD-10-CM | POA: Diagnosis not present

## 2021-09-26 DIAGNOSIS — E063 Autoimmune thyroiditis: Secondary | ICD-10-CM | POA: Diagnosis not present

## 2021-09-26 DIAGNOSIS — E039 Hypothyroidism, unspecified: Secondary | ICD-10-CM | POA: Diagnosis not present

## 2021-09-28 ENCOUNTER — Ambulatory Visit
Admission: RE | Admit: 2021-09-28 | Discharge: 2021-09-28 | Disposition: A | Discharge status: Home / Self Care | Payer: BC Managed Care – PPO | Attending: Vascular Surgery | Admitting: Vascular Surgery

## 2021-09-28 ENCOUNTER — Other Ambulatory Visit: Payer: Self-pay

## 2021-09-28 ENCOUNTER — Ambulatory Visit: Payer: BC Managed Care – PPO | Admitting: Anesthesiology

## 2021-09-28 ENCOUNTER — Encounter
Admission: RE | Disposition: A | Discharge status: Home / Self Care | Payer: Self-pay | Source: Home / Self Care | Attending: Vascular Surgery

## 2021-09-28 ENCOUNTER — Encounter: Payer: Self-pay | Admitting: Vascular Surgery

## 2021-09-28 DIAGNOSIS — L8990 Pressure ulcer of unspecified site, unspecified stage: Secondary | ICD-10-CM

## 2021-09-28 DIAGNOSIS — Z992 Dependence on renal dialysis: Secondary | ICD-10-CM | POA: Diagnosis not present

## 2021-09-28 DIAGNOSIS — E1122 Type 2 diabetes mellitus with diabetic chronic kidney disease: Secondary | ICD-10-CM | POA: Diagnosis not present

## 2021-09-28 DIAGNOSIS — T82898A Other specified complication of vascular prosthetic devices, implants and grafts, initial encounter: Secondary | ICD-10-CM | POA: Diagnosis not present

## 2021-09-28 DIAGNOSIS — Y841 Kidney dialysis as the cause of abnormal reaction of the patient, or of later complication, without mention of misadventure at the time of the procedure: Secondary | ICD-10-CM | POA: Insufficient documentation

## 2021-09-28 DIAGNOSIS — N186 End stage renal disease: Secondary | ICD-10-CM | POA: Diagnosis not present

## 2021-09-28 DIAGNOSIS — I132 Hypertensive heart and chronic kidney disease with heart failure and with stage 5 chronic kidney disease, or end stage renal disease: Secondary | ICD-10-CM | POA: Diagnosis not present

## 2021-09-28 DIAGNOSIS — Z7985 Long-term (current) use of injectable non-insulin antidiabetic drugs: Secondary | ICD-10-CM | POA: Insufficient documentation

## 2021-09-28 DIAGNOSIS — I5032 Chronic diastolic (congestive) heart failure: Secondary | ICD-10-CM | POA: Diagnosis not present

## 2021-09-28 DIAGNOSIS — T884XXA Failed or difficult intubation, initial encounter: Secondary | ICD-10-CM

## 2021-09-28 HISTORY — PX: PERIPHERAL VASCULAR THROMBECTOMY: CATH118306

## 2021-09-28 HISTORY — DX: Failed or difficult intubation, initial encounter: T88.4XXA

## 2021-09-28 LAB — GLUCOSE, CAPILLARY: Glucose-Capillary: 127 mg/dL — ABNORMAL HIGH (ref 70–99)

## 2021-09-28 LAB — POTASSIUM (ARMC VASCULAR LAB ONLY): Potassium (ARMC vascular lab): 4.5 mmol/L (ref 3.5–5.1)

## 2021-09-28 SURGERY — PERIPHERAL VASCULAR THROMBECTOMY
Anesthesia: General | Laterality: Left

## 2021-09-28 MED ORDER — PHENYLEPHRINE HCL (PRESSORS) 10 MG/ML IV SOLN
INTRAVENOUS | Status: AC
  Filled 2021-09-28: qty 1

## 2021-09-28 MED ORDER — SUCCINYLCHOLINE CHLORIDE 200 MG/10ML IV SOSY
PREFILLED_SYRINGE | INTRAVENOUS | Status: DC | PRN
  Administered 2021-09-28: 200 mg via INTRAVENOUS

## 2021-09-28 MED ORDER — ONDANSETRON HCL 4 MG/2ML IJ SOLN
INTRAMUSCULAR | Status: DC | PRN
  Administered 2021-09-28: 4 mg via INTRAVENOUS

## 2021-09-28 MED ORDER — FAMOTIDINE 20 MG PO TABS: 40.0000 mg | ORAL_TABLET | Freq: Once | ORAL | Status: DC | PRN

## 2021-09-28 MED ORDER — CEFAZOLIN SODIUM-DEXTROSE 1-4 GM/50ML-% IV SOLN
INTRAVENOUS | Status: AC
  Filled 2021-09-28: qty 50

## 2021-09-28 MED ORDER — DIPHENHYDRAMINE HCL 50 MG/ML IJ SOLN: 50.0000 mg | Freq: Once | INTRAMUSCULAR | Status: DC | PRN

## 2021-09-28 MED ORDER — LIDOCAINE HCL (CARDIAC) PF 100 MG/5ML IV SOSY
PREFILLED_SYRINGE | INTRAVENOUS | Status: DC | PRN
  Administered 2021-09-28: 100 mg via INTRAVENOUS

## 2021-09-28 MED ORDER — CEFAZOLIN SODIUM-DEXTROSE 1-4 GM/50ML-% IV SOLN
1.0000 g | INTRAVENOUS | Status: AC
  Administered 2021-09-28: 1 g via INTRAVENOUS

## 2021-09-28 MED ORDER — SODIUM CHLORIDE 0.9 % IV SOLN: INTRAVENOUS | Status: DC

## 2021-09-28 MED ORDER — HYDROMORPHONE HCL 1 MG/ML IJ SOLN: 1.0000 mg | Freq: Once | INTRAMUSCULAR | Status: DC | PRN

## 2021-09-28 MED ORDER — FENTANYL CITRATE (PF) 100 MCG/2ML IJ SOLN
INTRAMUSCULAR | Status: DC | PRN
  Administered 2021-09-28: 50 ug via INTRAVENOUS

## 2021-09-28 MED ORDER — MIDAZOLAM HCL 2 MG/ML PO SYRP: 8.0000 mg | ORAL_SOLUTION | Freq: Once | ORAL | Status: DC | PRN

## 2021-09-28 MED ORDER — FENTANYL CITRATE (PF) 100 MCG/2ML IJ SOLN
INTRAMUSCULAR | Status: AC
  Filled 2021-09-28: qty 2

## 2021-09-28 MED ORDER — PHENYLEPHRINE HCL-NACL 20-0.9 MG/250ML-% IV SOLN
INTRAVENOUS | Status: AC
  Filled 2021-09-28: qty 250

## 2021-09-28 MED ORDER — IODIXANOL 320 MG/ML IV SOLN
INTRAVENOUS | Status: DC | PRN
  Administered 2021-09-28: 25 mL via INTRAVENOUS

## 2021-09-28 MED ORDER — PHENYLEPHRINE HCL-NACL 20-0.9 MG/250ML-% IV SOLN
INTRAVENOUS | Status: DC | PRN
  Administered 2021-09-28: 40 ug/min via INTRAVENOUS

## 2021-09-28 MED ORDER — METHYLPREDNISOLONE SODIUM SUCC 125 MG IJ SOLR: 125.0000 mg | Freq: Once | INTRAMUSCULAR | Status: DC | PRN

## 2021-09-28 MED ORDER — PROPOFOL 10 MG/ML IV BOLUS
INTRAVENOUS | Status: DC | PRN
  Administered 2021-09-28: 200 mg via INTRAVENOUS

## 2021-09-28 MED ORDER — ONDANSETRON HCL 4 MG/2ML IJ SOLN: 4.0000 mg | Freq: Four times a day (QID) | INTRAMUSCULAR | Status: DC | PRN

## 2021-09-28 MED ORDER — HEPARIN SODIUM (PORCINE) 1000 UNIT/ML IJ SOLN
INTRAMUSCULAR | Status: DC | PRN
  Administered 2021-09-28: 4000 [IU] via INTRAVENOUS

## 2021-09-28 SURGICAL SUPPLY — 15 items
CATH BEACON 5 .035 40 KMP TP (CATHETERS) IMPLANT
CATH BEACON 5 .038 40 KMP TP (CATHETERS) ×1
CATH MICROCATH PRGRT 2.8F 110 (CATHETERS) IMPLANT
DRAPE BRACHIAL (DRAPES) IMPLANT
GLIDEWIRE ADV .035X180CM (WIRE) IMPLANT
GUIDEWIRE ADV .018X180CM (WIRE) IMPLANT
MICROCATH PROGREAT 2.8F 110 CM (CATHETERS) ×1
NDL ENTRY 21GA 7CM ECHOTIP (NEEDLE) IMPLANT
NEEDLE ENTRY 21GA 7CM ECHOTIP (NEEDLE) ×1 IMPLANT
PACK ANGIOGRAPHY (CUSTOM PROCEDURE TRAY) ×1 IMPLANT
SET INTRO CAPELLA COAXIAL (SET/KITS/TRAYS/PACK) IMPLANT
SHEATH BRITE TIP 6FRX5.5 (SHEATH) IMPLANT
SHEATH PROBE COVER 6X72 (BAG) IMPLANT
SUT MNCRL AB 4-0 PS2 18 (SUTURE) IMPLANT
TOWEL OR 17X26 4PK STRL BLUE (TOWEL DISPOSABLE) IMPLANT

## 2021-09-28 NOTE — Op Note (Signed)
OPERATIVE NOTE   PROCEDURE: Contrast injection left arm radiocephalic fistula  PRE-OPERATIVE DIAGNOSIS: Complication of dialysis access                                                       End Stage Renal Disease  POST-OPERATIVE DIAGNOSIS: same as above   SURGEON: Katha Cabal, M.D.  ANESTHESIA: General.  ESTIMATED BLOOD LOSS: minimal  FINDING(S): Sclerotic vein there is now occlusion of the arterial anastomosis and first centimeters or 2 of the cephalic vein.  More proximal to this the vein is sclerotic and does not appear that it will ever become a viable option for fistula  SPECIMEN(S):  None  CONTRAST: 25 cc  FLUOROSCOPY TIME: 7.2 minutes  INDICATIONS: Nicole Schmidt is a 56 y.o. female who  presents with malfunctioning left wrist AV access.  The patient is scheduled for angiography with possible intervention of the AV access to prevent loss of the permanent access.  The patient is aware the risks include but are not limited to: bleeding, infection, thrombosis of the cannulated access, and possible anaphylactic reaction to the contrast.  The patient acknowledges if the access can not be salvaged a tunneled catheter will be needed and will be placed during this procedure.  The patient is aware of the risks of the procedure and elects to proceed with the angiogram and intervention.  DESCRIPTION: After full informed written consent was obtained, the patient was brought back to the Special Procedure suite and placed supine position.  Appropriate cardiopulmonary monitors were placed.  The left arm was prepped and draped in the standard fashion.  Appropriate timeout is called.   The left radiocephalic access was cannulated with a micropuncture needle under ultrasound guidence near the elbow in a retrograde direction.  Ultrasound was used to evaluate the left wrist access.  It was echolucent and compressible indicating it is patent .  An ultrasound image was acquired for the  permanent record.  A micropuncture needle was used to access the cephalic vein access under direct ultrasound guidance.  The microwire was then advanced under fluoroscopic guidance without difficulty followed by the micro-sheath.  The J-wire was then advanced and a 6 Fr sheath inserted.  Hand injections were completed to image the access from the arterial anastomosis through the entire access.  The central venous structures were also imaged by hand injections.  Using a combination of Kumpe catheter and straight glide catheter as well as 035 advantage wires and stiff angled glide wires as well as 018 advantage wires I attempted to access the radial artery.  However this was not successful and I do not believe that this fistula will ever be a viable dialysis access.  Based on the images, there is marked irregularity of the cephalic vein from the wrist to the mid forearm this appears significantly worse than the recent fistulogram.  There is an occlusion at the level of the anastomosis which I am unable to cross and therefore I cannot reestablish inflow.  From the level of the antecubital fossa there is a cephalic vein which is quite large at the level of the antecubital fossa and of nice caliber all the way through its course to the cephalic subclavian confluence.  The central veins including the axillary, subclavian and innominate veins are widely patent as is the superior  vena cava.  There are paired brachial veins in the upper arm that are widely patent as well.  A 4-0 Monocryl purse-string suture was sewn around the sheath.  The sheath was removed and light pressure was applied.  A sterile bandage was applied to the puncture site.  Summary: Based on my findings as well as the imaging there are several alternatives which I will discuss with the patient clearly her cephalic vein in the upper arm appears to be of good quality for creation of a brachiocephalic fistula.  However given the size of her upper arm  clearly superficialization would be necessary at some point.  Alternatively a forearm loop graft may be possible but the deep veins at the level of the antecubital fossa do not appear to be of generous caliber.  Certainly given the axillary outflow a brachial axillary vein is an alternative as well.  The situation will be discussed with the patient in the office in follow-up.    COMPLICATIONS: None  CONDITION: Unchanged  Katha Cabal, M.D [facility] Office: [phone]  09/28/2021 3:36 PM

## 2021-09-28 NOTE — H&P (Signed)
Nicole Schmidt is a 56 year old female who presents today from her dialysis center with concern about her new radiocephalic AV fistula placement.  She notes that they have been hearing a whistling in the area.  The patient was placed on 07/22/2021.  She denies any significant pain in her hand although some numbness in her thumb area.  The wound is healing well with some scabbing.Following most recent intervention, the patient continues to have issues with access.         Review of Systems  Skin:  Positive for wound.  All other systems reviewed and are negative.         Objective:    Objective  Physical Exam Vitals reviewed.  HENT:     Head: Normocephalic.  Cardiovascular:     Rate and Rhythm: Normal rate.     Pulses: Normal pulses.     Arteriovenous access: Left arteriovenous access is present.    Comments: Very faint thrill with whistling bruit Pulmonary:     Effort: Pulmonary effort is normal.  Skin:    General: Skin is warm and dry.  Neurological:     Mental Status: She is alert and oriented to person, place, and time.  Psychiatric:        Mood and Affect: Mood normal.        Behavior: Behavior normal.        Thought Content: Thought content normal.        Judgment: Judgment normal.        BP (!) 146/69 (BP Location: Right Arm)   Pulse 92   Resp 16   Wt (!) 345 lb 9.6 oz (156.8 kg)   LMP 07/17/2012   BMI 59.32 kg/m        Past Medical History:  Diagnosis Date   (HFpEF) heart failure with preserved ejection fraction ([place])      a. 10/2018 Echo: EF 60-65%, diast dysfxn, RVSP 50.7 mmHg; b. 01/2020 Echo: EF 65-70%, G2DD; c. 01/2021 Echo: EF 60-65%, no rwma, mod LVH. Nl RV fxn, mildly enlarged RV. Mod BAE. Triv MR. d. 05/2021 Echo: EF 55-60; no RWMAs, mild LVH, mild TR.   Acquired hypothyroidism 02/09/2020   Anemia     Aortic atherosclerosis (HCC)     Arthritis     Breast cancer of upper-inner quadrant of right female breast ([place])      Right breast invasive CA and DCIS  , 7 mm T1,N0,M0. Er/PR pos, her 2 negative.  Margins 1 mm.   COPD (chronic obstructive pulmonary disease) (HCC)     Diabetes mellitus type 2 in obese ([place]) 02/09/2020   Dyslipidemia 02/09/2020   ESBL (extended spectrum beta-lactamase) producing bacteria infection 03/20/2021    a. urine culture (+) for ESBL producing E.coli and P. mirabilis   ESRD (end stage renal disease) on dialysis ([place])      a. T-Th-Sat HD initiated 02/2021   Essential hypertension     Gout     History of kidney stones     Menopause      age 34   Morbid obesity ([place])     OSA treated with BiPAP      a. on nocturnal PAP therapy; Trilogy 100   Permanent atrial fibrillation ([place])      a. Dx 10/2018 in setting of PNA. b. CHA2DS2-VASc = 7 (sex, HFpEF, HTN, PE x2, aortic plaque, T2DM); c. s/p successful DCCV 11/2018. d. subsequent recurrent Afib; rate/rhythm maintained on oral metoprolol succinate; chronically anticoagulated using standard dose apixaban.  Personal history of radiation therapy     Pulmonary embolism ([place]) 10/2014    a. on chronic apixaban   Thyroid goiter        Social History         Socioeconomic History   Marital status: Single      Spouse name: Not on file   Number of children: Not on file   Years of education: Not on file   Highest education level: Not on file  Occupational History   Occupation: retired  Tobacco Use   Smoking status: Never      Passive exposure: Never   Smokeless tobacco: Never  Vaping Use   Vaping Use: Never used  Substance and Sexual Activity   Alcohol use: No      Alcohol/week: 0.0 standard drinks of alcohol   Drug use: No   Sexual activity: Not on file  Other Topics Concern   Not on file  Social History Narrative    Lives in [place] by herself and retired.     Social Determinants of Health        Financial Resource Strain: Unknown (11/15/2017)    Overall Financial Resource Strain (CARDIA)     Difficulty of Paying Living Expenses: Patient refused  Food  Insecurity: Unknown (11/15/2017)    Hunger Vital Sign     Worried About Running Out of Food in the Last Year: Patient refused     [place] in the Last Year: Patient refused  Transportation Needs: Unknown (11/15/2017)    [employer] [place] - [employer] forces logistics/support/administrative officer (Medical): Patient refused     Lack of Transportation (Non-Medical): Patient refused  Physical Activity: Unknown (11/15/2017)    Exercise Vital Sign     Days of Exercise per Week: Patient refused     Minutes of Exercise per Session: Patient refused  Stress: Unknown (11/15/2017)    [place] of Stress : Patient refused  Social Connections: Unknown (11/15/2017)    Social Connection and Isolation Panel [NHANES]     Frequency of Communication with Friends and Family: Patient refused     Frequency of Social Gatherings with Friends and Family: Patient refused     Attends Religious Services: Patient refused     Active Member of Clubs or Organizations: Patient refused     Attends Archivist Meetings: Patient refused     Marital Status: Patient refused  Intimate Partner Violence: Unknown (11/15/2017)    Humiliation, Afraid, Rape, and Kick questionnaire     Fear of Current or Ex-Partner: Patient refused     Emotionally Abused: Patient refused     Physically Abused: Patient refused     Sexually Abused: Patient refused           Past Surgical History:  Procedure Laterality Date   AV FISTULA PLACEMENT Left 07/22/2021    Procedure: ARTERIOVENOUS (AV) FISTULA CREATION ([place]);  Surgeon: Katha Cabal, MD;  Location: [facility] ORS;  Service: Vascular;  Laterality: Left;   BREAST BIOPSY Right 2014    breast ca   BREAST EXCISIONAL BIOPSY Right 07/19/2012    breast ca   BREAST SURGERY Right 2014    with sentinel node bx subareaolar duct excision   CARDIOVERSION N/A 11/28/2018    Procedure: CARDIOVERSION;  Surgeon: Minna Merritts, MD;  Location: [facility] ORS;  Service: Cardiovascular;  Laterality: N/A;   COLONOSCOPY WITH PROPOFOL N/A 01/30/2017  Procedure: COLONOSCOPY WITH PROPOFOL;  Surgeon: Christene Lye, MD;  Location[facility];  Service: Endoscopy;  Laterality: N/A;   CYSTOSCOPY W/ RETROGRADES   06/17/2021    Procedure: CYSTOSCOPY WITH RETROGRADE PYELOGRAM;  Surgeon: Billey Co, MD;  Location: [facility] ORS;  Service: Urology;;   CYSTOSCOPY W/ URETERAL STENT PLACEMENT Left 02/14/2021    Procedure: CYSTOSCOPY WITH RETROGRADE PYELOGRAM/URETERAL STENT PLACEMENT;  Surgeon: Billey Co, MD;  Location: [facility];  Service: Urology;  Laterality: Left;   CYSTOSCOPY/URETEROSCOPY/HOLMIUM LASER/STENT PLACEMENT Left 06/17/2021    Procedure: CYSTOSCOPY/URETEROSCOPY/HOLMIUM LASER/STENT EXCHANGE;  Surgeon: Billey Co, MD;  Location: [facility] ORS;  Service: Urology;  Laterality: Left;   DIALYSIS/PERMA CATHETER INSERTION N/A 02/24/2021    Procedure: DIALYSIS/PERMA CATHETER INSERTION;  Surgeon: Algernon Huxley, MD;  Location: [facility];  Service: Cardiovascular;  Laterality: N/A;           Family History  Problem Relation Age of Onset   Diabetes Father     Hypertension Father     Parkinson's disease Father     Hypertension Mother     Rheum arthritis Mother     Atrial fibrillation Mother     Heart failure Mother     Heart attack Mother          died in her mid-70's.   Colon cancer Cousin 54   Breast cancer Neg Hx             Allergies  Allergen Reactions   Spironolactone        Hyperkalemia   Pt unaware of this allergy          Latest Ref Rng & Units 07/26/2021   10:22 PM 07/22/2021    6:45 AM 06/17/2021    2:15 PM  CBC  WBC 4.0 - 10.5 K/uL 15.5       Hemoglobin 12.0 - 15.0 g/dL 12.2  13.9  14.3   Hematocrit 36.0 - 46.0 % 39.0  41.0  42.0   Platelets 150 - 400 K/uL 213               CMP     Labs (Brief)          Component Value Date/Time    NA 131 (L) 07/26/2021 2222    NA 144  02/27/2020 0931    NA 141 03/09/2014 1533    K 4.2 07/26/2021 2222    K 4.2 03/09/2014 1533    CL 91 (L) 07/26/2021 2222    CL 101 03/09/2014 1533    CO2 27 07/26/2021 2222    CO2 30 03/09/2014 1533    GLUCOSE 164 (H) 07/26/2021 2222    GLUCOSE 95 03/09/2014 1533    BUN 57 (H) 07/26/2021 2222    BUN 40 (H) 02/27/2020 0931    BUN 35 (H) 03/09/2014 1533    CREATININE 5.84 (H) 07/26/2021 2222    CREATININE 1.50 (H) 03/09/2014 1533    CALCIUM 8.9 07/26/2021 2222    CALCIUM 8.8 03/09/2014 1533    PROT 7.5 07/26/2021 2222    PROT 7.3 03/09/2014 1533    ALBUMIN 3.6 07/26/2021 2222    ALBUMIN 3.5 03/09/2014 1533    AST 17 07/26/2021 2222    AST 16 03/09/2014 1533    ALT 6 07/26/2021 2222    ALT 23 03/09/2014 1533    ALKPHOS 162 (H) 07/26/2021 2222    ALKPHOS 113 03/09/2014 1533    BILITOT 0.8 07/26/2021 2222    BILITOT  0.3 03/09/2014 1533    GFRNONAA 8 (L) 07/26/2021 2222    GFRNONAA 39 (L) 03/09/2014 1533    GFRNONAA 55 (L) 07/31/2013 1521    GFRAA 35 (L) 02/27/2020 0931    GFRAA 47 (L) 03/09/2014 1533    GFRAA >60 07/31/2013 1521          No results found.       Assessment & Plan:    1. ESRD (end stage renal disease) ([place]) Recommend:   The patient is experiencing increasing problems with their dialysis access.   Patient should have a fistulagram with the intention for intervention.  The intention for intervention is to restore appropriate flow and prevent thrombosis and possible loss of the access.  As well as improve the quality of dialysis therapy.   The risks, benefits and alternative therapies were reviewed in detail with the patient.  All questions were answered.  The patient agrees to proceed with angio/intervention.     The patient will follow up with me in the office after the procedure.   - VAS US DUPLEX DIALYSIS ACCESS (AVF, AVG)           Current Outpatient Medications on File Prior to Visit  Medication Sig Dispense Refill   acetaminophen (TYLENOL)  325 MG tablet Take 2 tablets (650 mg total) by mouth every 6 (six) hours as needed for mild pain (or Fever >/= 101).       ADVAIR DISKUS 250-50 MCG/ACT AEPB Inhale 1 puff into the lungs 2 (two) times daily.       albuterol (VENTOLIN HFA) 108 (90 Base) MCG/ACT inhaler Inhale 2 puffs into the lungs every 6 (six) hours as needed for wheezing or shortness of breath. 8.5 g 0   amLODipine (NORVASC) 5 MG tablet Take 1 tablet (5 mg total) by mouth daily. 90 tablet 3   apixaban (ELIQUIS) 5 MG TABS tablet Take 1 tablet (5 mg total) by mouth 2 (two) times daily. 180 tablet 1   atorvastatin (LIPITOR) 20 MG tablet Take 1 tablet (20 mg total) by mouth at bedtime. 90 tablet 3   calcitRIOL (ROCALTROL) 0.25 MCG capsule Take 1 capsule (0.25 mcg total) by mouth daily. 30 capsule 0   calcium carbonate (OS-CAL - DOSED IN MG OF ELEMENTAL CALCIUM) 1250 (500 Ca) MG tablet Take 1 tablet (1,250 mg total) by mouth daily with breakfast.       cloNIDine (CATAPRES) 0.2 MG tablet Take 0.2 mg by mouth 2 (two) times daily.       diclofenac Sodium (VOLTAREN) 1 % GEL Apply 2 g topically 4 (four) times daily. 100 g 0   ergocalciferol (VITAMIN D2) 1.25 MG (50000 UT) capsule Take 1 capsule (50,000 Units total) by mouth once a week. 5 capsule 0   ferric gluconate 250 mg in sodium chloride 0.9 % 250 mL Inject 250 mg into the vein Every Tuesday,Thursday,and Saturday with dialysis.       folic acid (FOLVITE) 1 MG tablet Take 1 tablet (1 mg total) by mouth daily. 30 tablet 0   gabapentin (NEURONTIN) 300 MG capsule Take 1 capsule (300 mg total) by mouth at bedtime. 30 capsule 0   HYDROcodone-acetaminophen (NORCO) 5-325 MG tablet Take 1-2 tablets by mouth every 6 (six) hours as needed. 25 tablet 0   Lancets (ONETOUCH DELICA PLUS [id number]) MISC         levothyroxine (SYNTHROID) 112 MCG tablet Take 1 tablet (112 mcg total) by mouth daily before breakfast. 30 tablet 0  melatonin 5 MG TABS Take 2 tablets (10 mg total) by mouth at bedtime as  needed (sleep). 60 tablet 0   metoprolol succinate (TOPROL-XL) 25 MG 24 hr tablet Take 1 tablet (25 mg total) by mouth daily. Take with or immediately following a meal. 90 tablet 3   midodrine (PROAMATINE) 10 MG tablet Take 1 tablet (10 mg total) by mouth Every Tuesday,Thursday,and Saturday with dialysis. 30 tablet 0   ONETOUCH ULTRA test strip         senna (SENOKOT) 8.6 MG tablet Take 1 tablet by mouth as needed for constipation.       sevelamer carbonate (RENVELA) 800 MG tablet Take 1 tablet (800 mg total) by mouth 3 (three) times daily with meals. 90 tablet 0   traMADol (ULTRAM) 50 MG tablet Take 1 tablet (50 mg total) by mouth every 12 (twelve) hours as needed for moderate pain. 30 tablet 0   TRULICITY 1.5 AS/3.4HD SOPN Inject 1.5 mg into the skin every Monday.        No current facility-administered medications on file prior to visit.      There are no Patient Instructions on file for this visit. No follow-ups on file.     Kris Hartmann, NP

## 2021-09-28 NOTE — Interval H&P Note (Signed)
History and Physical Interval Note:  09/28/2021 2:02 PM  Nicole Schmidt  has presented today for surgery, with the diagnosis of L Arm declot   End Stage Renal.  The various methods of treatment have been discussed with the patient and family. After consideration of risks, benefits and other options for treatment, the patient has consented to  Procedure(s): PERIPHERAL VASCULAR THROMBECTOMY (Left) as a surgical intervention.  The patient's history has been reviewed, patient examined, no change in status, stable for surgery.  I have reviewed the patient's chart and labs.  Questions were answered to the patient's satisfaction.     [name]

## 2021-09-28 NOTE — Anesthesia Preprocedure Evaluation (Addendum)
Anesthesia Evaluation  Patient identified by MRN, date of birth, ID band Patient awake  General Assessment Comment:  In past anesthetic, patient retaining CO2 upon extubation, needing BiPAP in PACU.  Reviewed: Allergy & Precautions, NPO status , Patient's Chart, lab work & pertinent test results  History of Anesthesia Complications Negative for: history of anesthetic complications  Airway Mallampati: I  TM Distance: >3 FB Neck ROM: Full    Dental  (+) Missing,    Pulmonary sleep apnea and Oxygen sleep apnea , COPD,  oxygen dependent, Patient abstained from smoking.Not current smoker,  H/o PE 2016  2L at night  restrictive lung disease    + decreased breath sounds      Cardiovascular Exercise Tolerance: Poor METS: < 3 Mets hypertension, Pt. on medications +CHF  (-) CAD and (-) Past MI + dysrhythmias Atrial Fibrillation  Rhythm:Regular Rate:Normal - Systolic murmurs 1. Left ventricular ejection fraction, by estimation, is 60 to 65%. The  left ventricle has normal function. Left ventricular endocardial border  not optimally defined to evaluate regional wall motion. There is moderate  left ventricular hypertrophy. Left  ventricular diastolic function could not be evaluated.  2. Right ventricular systolic function is normal. The right ventricular  size is mildly enlarged.  3. Left atrial size was moderately dilated.  4. Right atrial size was moderately dilated.  5. The mitral valve is normal in structure. Trivial mitral valve  regurgitation.  6. The aortic valve has an indeterminant number of cusps. Aortic valve  regurgitation is not visualized. No aortic stenosis is present.  7. The inferior vena cava is normal in size with greater than 50%  respiratory variability, suggesting right atrial pressure of 3 mmHg.    Neuro/Psych negative neurological ROS  negative psych ROS   GI/Hepatic neg GERD  ,(+)     (-) substance  abuse  ,   Endo/Other  diabetesHypothyroidism Morbid obesity  Renal/GU ESRF and DialysisRenal diseaseDialyzed yesterday     Musculoskeletal   Abdominal (+) + obese,   Peds  Hematology   Anesthesia Other Findings  L Arm declot   Past Medical History: No date: (HFpEF) heart failure with preserved ejection fraction ([place])     Comment:  a. 10/2018 Echo: EF 60-65%, diast dysfxn, RVSP 50.7 mmHg,              mildly dil LA. 02/09/2020: Acquired hypothyroidism No date: Anemia No date: Arthritis 2014: Breast cancer ([place])     Comment:  right breast cancer No date: Breast cancer of upper-inner quadrant of right female breast  ([place])     Comment:  Right breast invasive CA and DCIS , 7 mm T1,N0,M0. Er/PR              pos, her 2 negative.  Margins 1 mm. No date: CHF (congestive heart failure) (HCC) No date: CKD (chronic kidney disease), stage III ([place]) 02/09/2020: Diabetes mellitus type 2 in obese ([place]) No date: Diabetes mellitus without complication ([place]) 77/82/4235: Dyslipidemia No date: Essential hypertension No date: Gout No date: Hypothyroidism No date: Menopause     Comment:  age 54 No date: Morbid obesity ([place]) No date: Persistent atrial fibrillation ([place])     Comment:  a. Dx 10/2018 in setting of PNA. CHA2DS2VASc =               4-->Eliquis; b. 11/2018 s/p successful DCCV. No date: Personal history of radiation therapy 10/2014: Pulmonary embolism (HCC)     Comment:  a. Chronic eliquis. No  date: Thyroid goiter  Reproductive/Obstetrics                            Anesthesia Physical  Anesthesia Plan  ASA: 4  Anesthesia Plan: General   Post-op Pain Management: Ofirmev IV (intra-op)   Induction: Intravenous and Rapid sequence  PONV Risk Score and Plan: 3 and Ondansetron, Dexamethasone and Treatment may vary due to age or medical condition  Airway Management Planned: Oral ETT and Video Laryngoscope Planned  Additional Equipment:  None  Intra-op Plan:   Post-operative Plan: Extubation in OR  Informed Consent: I have reviewed the patients History and Physical, chart, labs and discussed the procedure including the risks, benefits and alternatives for the proposed anesthesia with the patient or authorized representative who has indicated his/her understanding and acceptance.     Dental advisory given  Plan Discussed with: CRNA and Surgeon  Anesthesia Plan Comments: (Discussed risks of anesthesia with patient, including PONV, sore throat, lip/dental/eye damage. Rare risks discussed as well, such as cardiorespiratory and neurological sequelae, and allergic reactions. Discussed the role of CRNA in patient's perioperative care. Patient understands. Patient informed about increased incidence of above perioperative risk due to high BMI. Patient understands.  Patient has her CPAP her, can use in PACU.)       Anesthesia Quick Evaluation

## 2021-09-28 NOTE — Anesthesia Procedure Notes (Signed)
Procedure Name: Intubation Date/Time: 09/28/2021 2:31 PM  Performed by: Nelda Marseille, CRNAPre-anesthesia Checklist: Patient identified, Patient being monitored, Timeout performed, Emergency Drugs available and Suction available Patient Re-evaluated:Patient Re-evaluated prior to induction Oxygen Delivery Method: Circle System Utilized Preoxygenation: Pre-oxygenation with 100% oxygen Induction Type: IV induction Ventilation: Mask ventilation without difficulty Laryngoscope Size: Mac, 3 and McGraph Grade View: Grade I Tube type: Oral Tube size: 7.0 mm Number of attempts: 1 Airway Equipment and Method: Stylet and Oral airway Placement Confirmation: ETT inserted through vocal cords under direct vision, positive ETCO2 and breath sounds checked- equal and bilateral Secured at: 21 cm Tube secured with: Tape Dental Injury: Teeth and Oropharynx as per pre-operative assessment  Difficulty Due To: Difficulty was unanticipated

## 2021-09-28 NOTE — Transfer of Care (Signed)
Immediate Anesthesia Transfer of Care Note  Patient: Nicole Schmidt  Procedure(s) Performed: PERIPHERAL VASCULAR THROMBECTOMY (Left)  Patient Location: PACU  Anesthesia Type:General  Level of Consciousness: awake, alert  and oriented  Airway & Oxygen Therapy: Patient Spontanous Breathing and Patient connected to face mask oxygen  Post-op Assessment: Report given to RN and Post -op Vital signs reviewed and stable  Post vital signs: Reviewed and stable  Last Vitals:  Vitals Value Taken Time  BP 132/45 09/28/21 1520  Temp 36.1 C 09/28/21 1520  Pulse 94 09/28/21 1522  Resp 13 09/28/21 1522  SpO2 99 % 09/28/21 1522  Vitals shown include unvalidated device data.  Last Pain:  Vitals:   09/28/21 1520  TempSrc:   PainSc: 0-No pain         Complications:  Encounter Notable Events  Notable Event Outcome Phase Comment  Difficult to intubate - unexpected  Intraprocedure Filed from anesthesia note documentation.

## 2021-09-28 NOTE — Interval H&P Note (Signed)
History and Physical Interval Note:  09/28/2021 2:01 PM  Nicole Schmidt  has presented today for surgery, with the diagnosis of L Arm declot   End Stage Renal.  The various methods of treatment have been discussed with the patient and family. After consideration of risks, benefits and other options for treatment, the patient has consented to  Procedure(s): PERIPHERAL VASCULAR THROMBECTOMY (Left) as a surgical intervention.  The patient's history has been reviewed, patient examined, no change in status, stable for surgery.  I have reviewed the patient's chart and labs.  Questions were answered to the patient's satisfaction.     [name]

## 2021-09-29 LAB — GLUCOSE, CAPILLARY: Glucose-Capillary: 117 mg/dL — ABNORMAL HIGH (ref 70–99)

## 2021-09-29 NOTE — Anesthesia Postprocedure Evaluation (Signed)
Anesthesia Post Note  Patient: Nicole Schmidt  Procedure(s) Performed: PERIPHERAL VASCULAR THROMBECTOMY (Left)  Patient location during evaluation: PACU Anesthesia Type: General Level of consciousness: awake and alert Pain management: pain level controlled Vital Signs Assessment: post-procedure vital signs reviewed and stable Respiratory status: spontaneous breathing, nonlabored ventilation and respiratory function stable Cardiovascular status: blood pressure returned to baseline and stable Postop Assessment: no apparent nausea or vomiting Anesthetic complications: yes   Encounter Notable Events  Notable Event Outcome Phase Comment  Difficult to intubate - unexpected  Intraprocedure Filed from anesthesia note documentation.     Last Vitals:  Vitals:   09/28/21 1615 09/28/21 1630  BP: (!) 120/45 (!) 116/37  Pulse: 96 89  Resp: 12 (!) 21  Temp:    SpO2: 90% 92%    Last Pain:  Vitals:   09/28/21 1630  TempSrc:   PainSc: 0-No pain                 Iran Ouch

## 2021-09-30 ENCOUNTER — Encounter: Payer: Self-pay | Admitting: Vascular Surgery

## 2021-10-03 DIAGNOSIS — J9610 Chronic respiratory failure, unspecified whether with hypoxia or hypercapnia: Secondary | ICD-10-CM | POA: Diagnosis not present

## 2021-10-03 DIAGNOSIS — D352 Benign neoplasm of pituitary gland: Secondary | ICD-10-CM | POA: Diagnosis not present

## 2021-10-03 DIAGNOSIS — I5043 Acute on chronic combined systolic (congestive) and diastolic (congestive) heart failure: Secondary | ICD-10-CM | POA: Diagnosis not present

## 2021-10-03 DIAGNOSIS — Z0001 Encounter for general adult medical examination with abnormal findings: Secondary | ICD-10-CM | POA: Diagnosis not present

## 2021-10-03 DIAGNOSIS — N185 Chronic kidney disease, stage 5: Secondary | ICD-10-CM | POA: Diagnosis not present

## 2021-10-03 DIAGNOSIS — Z1331 Encounter for screening for depression: Secondary | ICD-10-CM | POA: Diagnosis not present

## 2021-10-03 DIAGNOSIS — Z6841 Body Mass Index (BMI) 40.0 and over, adult: Secondary | ICD-10-CM | POA: Diagnosis not present

## 2021-10-06 ENCOUNTER — Other Ambulatory Visit (INDEPENDENT_AMBULATORY_CARE_PROVIDER_SITE_OTHER): Payer: Self-pay | Admitting: Vascular Surgery

## 2021-10-06 DIAGNOSIS — Z01810 Encounter for preprocedural cardiovascular examination: Secondary | ICD-10-CM

## 2021-10-06 DIAGNOSIS — N186 End stage renal disease: Secondary | ICD-10-CM

## 2021-10-07 ENCOUNTER — Ambulatory Visit (INDEPENDENT_AMBULATORY_CARE_PROVIDER_SITE_OTHER): Payer: BC Managed Care – PPO | Admitting: Nurse Practitioner

## 2021-10-07 ENCOUNTER — Encounter (INDEPENDENT_AMBULATORY_CARE_PROVIDER_SITE_OTHER): Payer: BC Managed Care – PPO

## 2021-10-10 ENCOUNTER — Ambulatory Visit (INDEPENDENT_AMBULATORY_CARE_PROVIDER_SITE_OTHER): Payer: BC Managed Care – PPO | Admitting: Vascular Surgery

## 2021-10-10 ENCOUNTER — Ambulatory Visit (INDEPENDENT_AMBULATORY_CARE_PROVIDER_SITE_OTHER): Payer: BC Managed Care – PPO

## 2021-10-10 ENCOUNTER — Encounter (INDEPENDENT_AMBULATORY_CARE_PROVIDER_SITE_OTHER): Payer: Self-pay | Admitting: Vascular Surgery

## 2021-10-10 VITALS — BP 124/64 | HR 90 | Resp 16 | Wt 349.7 lb

## 2021-10-10 DIAGNOSIS — E669 Obesity, unspecified: Secondary | ICD-10-CM

## 2021-10-10 DIAGNOSIS — I10 Essential (primary) hypertension: Secondary | ICD-10-CM

## 2021-10-10 DIAGNOSIS — N186 End stage renal disease: Secondary | ICD-10-CM | POA: Diagnosis not present

## 2021-10-10 DIAGNOSIS — E1169 Type 2 diabetes mellitus with other specified complication: Secondary | ICD-10-CM

## 2021-10-10 DIAGNOSIS — I482 Chronic atrial fibrillation, unspecified: Secondary | ICD-10-CM

## 2021-10-10 DIAGNOSIS — Z01810 Encounter for preprocedural cardiovascular examination: Secondary | ICD-10-CM | POA: Diagnosis not present

## 2021-10-10 DIAGNOSIS — E785 Hyperlipidemia, unspecified: Secondary | ICD-10-CM

## 2021-10-10 NOTE — Progress Notes (Signed)
MRN : [id number]  Nicole Schmidt is a 56 y.o. (1965/10/11) female who presents with chief complaint of check access.  History of Present Illness:   The patient returns to the office for follow up regarding a problem with their dialysis access.   Recent imaging 10/08/2021 showed:  Sclerotic vein there is now occlusion of the arterial anastomosis and first centimeters or 2 of the cephalic vein.  More proximal to this the vein is sclerotic and does not appear that it will ever become a viable option for fistula   The patient denies redness or swelling at the access site. The patient denies fever or chills at home or while on dialysis.  No recent shortening of the patient's walking distance or new symptoms consistent with claudication.  No history of rest pain symptoms. No new ulcers or wounds of the lower extremities have occurred.  The patient denies amaurosis fugax or recent TIA symptoms. There are no recent neurological changes noted. There is no history of DVT, PE or superficial thrombophlebitis. No recent episodes of angina or shortness of breath documented.    Current Meds  Medication Sig   ADVAIR DISKUS 250-50 MCG/ACT AEPB Inhale 1 puff into the lungs 2 (two) times daily.   albuterol (VENTOLIN HFA) 108 (90 Base) MCG/ACT inhaler Inhale 2 puffs into the lungs every 6 (six) hours as needed for wheezing or shortness of breath.   amLODipine (NORVASC) 5 MG tablet Take 1 tablet (5 mg total) by mouth daily.   apixaban (ELIQUIS) 5 MG TABS tablet Take 1 tablet (5 mg total) by mouth 2 (two) times daily.   atorvastatin (LIPITOR) 20 MG tablet Take 1 tablet (20 mg total) by mouth at bedtime.   calcitRIOL (ROCALTROL) 0.25 MCG capsule Take 1 capsule (0.25 mcg total) by mouth daily. (Patient taking differently: Take 0.5 mcg by mouth daily.)   calcium carbonate (OS-CAL - DOSED IN MG OF ELEMENTAL CALCIUM) 1250 (500 Ca) MG tablet Take 1 tablet (1,250 mg total) by mouth daily  with breakfast.   cloNIDine (CATAPRES) 0.2 MG tablet Take 0.2 mg by mouth 2 (two) times daily.   CRANBERRY PO Take 1 capsule by mouth daily.   ergocalciferol (VITAMIN D2) 1.25 MG (50000 UT) capsule Take 1 capsule (50,000 Units total) by mouth once a week.   ferric gluconate 250 mg in sodium chloride 0.9 % 250 mL Inject 250 mg into the vein Every Tuesday,Thursday,and Saturday with dialysis.   folic acid (FOLVITE) 1 MG tablet Take 1 tablet (1 mg total) by mouth daily.   gabapentin (NEURONTIN) 300 MG capsule Take 1 capsule (300 mg total) by mouth at bedtime.   Lancets (ONETOUCH DELICA PLUS [id number]) MISC    levothyroxine (SYNTHROID) 112 MCG tablet Take 1 tablet (112 mcg total) by mouth daily before breakfast.   melatonin 5 MG TABS Take 2 tablets (10 mg total) by mouth at bedtime as needed (sleep).   metoprolol succinate (TOPROL-XL) 25 MG 24 hr tablet Take 1 tablet (25 mg total) by mouth daily. Take with or immediately following a meal.   midodrine (PROAMATINE) 10 MG tablet Take 1 tablet (10 mg total) by mouth Every Tuesday,Thursday,and Saturday with dialysis.   ONETOUCH ULTRA test strip    sevelamer carbonate (RENVELA) 800 MG tablet Take 1 tablet (800 mg total) by mouth 3 (three) times daily with meals.   traMADol Veatrice Bourbon)  50 MG tablet Take 1 tablet (50 mg total) by mouth every 12 (twelve) hours as needed for moderate pain.   TRULICITY 1.5 JK/0.9FG SOPN Inject 1.5 mg into the skin every Monday.    Past Medical History:  Diagnosis Date   (HFpEF) heart failure with preserved ejection fraction ([place])    a. 10/2018 Echo: EF 60-65%, diast dysfxn, RVSP 50.7 mmHg; b. 01/2020 Echo: EF 65-70%, G2DD; c. 01/2021 Echo: EF 60-65%, no rwma, mod LVH. Nl RV fxn, mildly enlarged RV. Mod BAE. Triv MR. d. 05/2021 Echo: EF 55-60; no RWMAs, mild LVH, mild TR.   Acquired hypothyroidism 02/09/2020   Anemia    Aortic atherosclerosis (HCC)    Arthritis    Breast cancer of upper-inner quadrant of right female breast  ([place])    Right breast invasive CA and DCIS , 7 mm T1,N0,M0. Er/PR pos, her 2 negative.  Margins 1 mm.   COPD (chronic obstructive pulmonary disease) (HCC)    Diabetes mellitus type 2 in obese ([place]) 02/09/2020   Dyslipidemia 02/09/2020   ESBL (extended spectrum beta-lactamase) producing bacteria infection 03/20/2021   a. urine culture (+) for ESBL producing E.coli and P. mirabilis   ESRD (end stage renal disease) on dialysis ([place])    a. T-Th-Sat HD initiated 02/2021   Essential hypertension    Gout    History of kidney stones    Menopause    age 64   Morbid obesity ([place])    OSA treated with BiPAP    a. on nocturnal PAP therapy; Trilogy 100   Permanent atrial fibrillation ([place])    a. Dx 10/2018 in setting of PNA. b. CHA2DS2-VASc = 7 (sex, HFpEF, HTN, PE x2, aortic plaque, T2DM); c. s/p successful DCCV 11/2018. d. subsequent recurrent Afib; rate/rhythm maintained on oral metoprolol succinate; chronically anticoagulated using standard dose apixaban.   Personal history of radiation therapy    Pulmonary embolism ([place]) 10/2014   a. on chronic apixaban   Thyroid goiter     Past Surgical History:  Procedure Laterality Date   A/V FISTULAGRAM Left 09/02/2021   Procedure: A/V Fistulagram;  Surgeon: Katha Cabal, MD;  Location: [facility];  Service: Cardiovascular;  Laterality: Left;   AV FISTULA PLACEMENT Left 07/22/2021   Procedure: ARTERIOVENOUS (AV) FISTULA CREATION (RADIALCEPHALIC);  Surgeon: Katha Cabal, MD;  Location: [facility];  Service: Vascular;  Laterality: Left;   BREAST BIOPSY Right 2014   breast ca   BREAST EXCISIONAL BIOPSY Right 07/19/2012   breast ca   BREAST SURGERY Right 2014   with sentinel node bx subareaolar duct excision   CARDIOVERSION N/A 11/28/2018   Procedure: CARDIOVERSION;  Surgeon: Minna Merritts, MD;  Location: [facility];  Service: Cardiovascular;  Laterality: N/A;   COLONOSCOPY WITH PROPOFOL N/A 01/30/2017   Procedure: COLONOSCOPY WITH  PROPOFOL;  Surgeon: Christene Lye, MD;  Location: [facility];  Service: Endoscopy;  Laterality: N/A;   CYSTOSCOPY W/ RETROGRADES  06/17/2021   Procedure: CYSTOSCOPY WITH RETROGRADE PYELOGRAM;  Surgeon: Billey Co, MD;  Location: [facility];  Service: Urology;;   CYSTOSCOPY W/ URETERAL STENT PLACEMENT Left 02/14/2021   Procedure: CYSTOSCOPY WITH RETROGRADE PYELOGRAM/URETERAL STENT PLACEMENT;  Surgeon: Billey Co, MD;  Location: [facility];  Service: Urology;  Laterality: Left;   CYSTOSCOPY/URETEROSCOPY/HOLMIUM LASER/STENT PLACEMENT Left 06/17/2021   Procedure: CYSTOSCOPY/URETEROSCOPY/HOLMIUM LASER/STENT EXCHANGE;  Surgeon: Billey Co, MD;  Location: [facility];  Service: Urology;  Laterality: Left;   DIALYSIS/PERMA CATHETER INSERTION N/A 02/24/2021  Procedure: DIALYSIS/PERMA CATHETER INSERTION;  Surgeon: Algernon Huxley, MD;  Location: [place];  Service: Cardiovascular;  Laterality: N/A;   PERIPHERAL VASCULAR THROMBECTOMY Left 09/28/2021   Procedure: PERIPHERAL VASCULAR THROMBECTOMY;  Surgeon: Katha Cabal, MD;  Location: [place];  Service: Cardiovascular;  Laterality: Left;    Social History Social History   Tobacco Use   Smoking status: Never    Passive exposure: Never   Smokeless tobacco: Never  Vaping Use   Vaping Use: Never used  Substance Use Topics   Alcohol use: No    Alcohol/week: 0.0 standard drinks of alcohol   Drug use: No    Family History Family History  Problem Relation Age of Onset   Diabetes Father    Hypertension Father    Parkinson's disease Father    Hypertension Mother    Rheum arthritis Mother    Atrial fibrillation Mother    Heart failure Mother    Heart attack Mother        died in her mid-70's.   Colon cancer Cousin 54   Breast cancer Neg Hx     Allergies  Allergen Reactions   Spironolactone     Hyperkalemia  Pt unaware of this allergy     REVIEW OF SYSTEMS (Negative unless  checked)  Constitutional: '[]'$ Weight loss  '[]'$ Fever  '[]'$ Chills Cardiac: '[]'$ Chest pain   '[]'$ Chest pressure   '[]'$ Palpitations   '[]'$ Shortness of breath when laying flat   '[]'$ Shortness of breath with exertion. Vascular:  '[]'$ Pain in legs with walking   '[]'$ Pain in legs at rest  '[]'$ History of DVT   '[]'$ Phlebitis   '[]'$ Swelling in legs   '[]'$ Varicose veins   '[]'$ Non-healing ulcers Pulmonary:   '[]'$ Uses home oxygen   '[]'$ Productive cough   '[]'$ Hemoptysis   '[]'$ Wheeze  '[]'$ COPD   '[]'$ Asthma Neurologic:  '[]'$ Dizziness   '[]'$ Seizures   '[]'$ History of stroke   '[]'$ History of TIA  '[]'$ Aphasia   '[]'$ Vissual changes   '[]'$ Weakness or numbness in arm   '[]'$ Weakness or numbness in leg Musculoskeletal:   '[]'$ Joint swelling   '[]'$ Joint pain   '[]'$ Low back pain Hematologic:  '[]'$ Easy bruising  '[]'$ Easy bleeding   '[]'$ Hypercoagulable state   '[]'$ Anemic Gastrointestinal:  '[]'$ Diarrhea   '[]'$ Vomiting  '[]'$ Gastroesophageal reflux/heartburn   '[]'$ Difficulty swallowing. Genitourinary:  '[x]'$ Chronic kidney disease   '[]'$ Difficult urination  '[]'$ Frequent urination   '[]'$ Blood in urine Skin:  '[]'$ Rashes   '[]'$ Ulcers  Psychological:  '[]'$ History of anxiety   '[]'$  History of major depression.  Physical Examination  Vitals:   10/10/21 1010  BP: 124/64  Pulse: 90  Resp: 16  Weight: (!) 349 lb 11.2 oz (158.6 kg)   Body mass index is 60.03 kg/m. Gen: WD/WN, NAD Head: [place]/AT, No temporalis wasting.  Ear/Nose/Throat: Hearing grossly intact, nares w/o erythema or drainage Eyes: PER, EOMI, sclera nonicteric.  Neck: Supple, no gross masses or lesions.  No JVD.  Pulmonary:  Good air movement, no audible wheezing, no use of accessory muscles.  Cardiac: RRR, precordium non-hyperdynamic. Vascular:   left wrist fistula no thrill no bruit Vessel Right Left  Radial Palpable Palpable  Brachial Palpable Palpable  Gastrointestinal: soft, non-distended. No guarding/no peritoneal signs.  Musculoskeletal: M/S 5/5 throughout.  No deformity.  Neurologic: CN 2-12 intact. Pain and light touch intact in extremities.   Symmetrical.  Speech is fluent. Motor exam as listed above. Psychiatric: Judgment intact, Mood & affect appropriate for pt's clinical situation. Dermatologic: No rashes or ulcers noted.  No changes consistent with cellulitis.  CBC Lab Results  Component Value Date   WBC 15.5 (H) 07/26/2021   HGB 12.2 07/26/2021   HCT 39.0 07/26/2021   MCV 96.5 07/26/2021   PLT 213 07/26/2021    BMET    Component Value Date/Time   NA 131 (L) 07/26/2021 2222   NA 144 02/27/2020 0931   NA 141 03/09/2014 1533   K 4.2 07/26/2021 2222   K 4.2 03/09/2014 1533   CL 91 (L) 07/26/2021 2222   CL 101 03/09/2014 1533   CO2 27 07/26/2021 2222   CO2 30 03/09/2014 1533   GLUCOSE 164 (H) 07/26/2021 2222   GLUCOSE 95 03/09/2014 1533   BUN 57 (H) 07/26/2021 2222   BUN 40 (H) 02/27/2020 0931   BUN 35 (H) 03/09/2014 1533   CREATININE 5.84 (H) 07/26/2021 2222   CREATININE 1.50 (H) 03/09/2014 1533   CALCIUM 8.9 07/26/2021 2222   CALCIUM 8.8 03/09/2014 1533   GFRNONAA 8 (L) 07/26/2021 2222   GFRNONAA 39 (L) 03/09/2014 1533   GFRNONAA 55 (L) 07/31/2013 1521   GFRAA 35 (L) 02/27/2020 0931   GFRAA 47 (L) 03/09/2014 1533   GFRAA >60 07/31/2013 1521   CrCl cannot be calculated (Patient's most recent lab result is older than the maximum 21 days allowed.).  COAG Lab Results  Component Value Date   INR 1.1 02/15/2021   INR 1.35 01/12/2018   INR 1.27 11/16/2017    Radiology PERIPHERAL VASCULAR CATHETERIZATION  Result Date: 09/28/2021 See surgical note for result.    Assessment/Plan 1. ESRD (end stage renal disease) ([place]) Recommend:  At this time the patient does not have appropriate extremity access for dialysis.  Her original wrist fistula is not salvageable but the imagine from the angiogram shows a good size cephalic vein in the upper arm.  Patient should have a left brachial cephalic fistula created.  Option of a brachial axillary graft ws discussed but this can still be created if the fistula  doesn't work so it is reasonable to try the fistula first.  The risks, benefits and alternative therapies were reviewed in detail with the patient.  All questions were answered.  The patient agrees to proceed with surgery.   The patient will follow up with me in the office after the surgery.   2. Atrial fibrillation, chronic (HCC) Continue antiarrhythmia medications as already ordered, these medications have been reviewed and there are no changes at this time.  Continue anticoagulation as ordered by Cardiology Service   3. Essential hypertension Continue antihypertensive medications as already ordered, these medications have been reviewed and there are no changes at this time.   4. Diabetes mellitus type 2 in obese ([place]) Continue hypoglycemic medications as already ordered, these medications have been reviewed and there are no changes at this time.  Hgb A1C to be monitored as already arranged by primary service   5. Dyslipidemia Continue statin as ordered and reviewed, no changes at this time     [name], MD  10/10/2021 10:24 AM

## 2021-10-12 ENCOUNTER — Telehealth (INDEPENDENT_AMBULATORY_CARE_PROVIDER_SITE_OTHER): Payer: Self-pay

## 2021-10-12 NOTE — Telephone Encounter (Signed)
Spoke with the patient and she is scheduled with Dr. Delana Meyer for a left brachial cephalic fistula on 49/97/18 at the [facility]. Pre-op phone call on 10/20/21 between 1-5 pm. Pre-surgical instructions were discussed and will be mailed.

## 2021-10-13 DIAGNOSIS — N186 End stage renal disease: Secondary | ICD-10-CM | POA: Diagnosis not present

## 2021-10-13 DIAGNOSIS — Z992 Dependence on renal dialysis: Secondary | ICD-10-CM | POA: Diagnosis not present

## 2021-10-19 DIAGNOSIS — E662 Morbid (severe) obesity with alveolar hypoventilation: Secondary | ICD-10-CM | POA: Diagnosis not present

## 2021-10-20 ENCOUNTER — Inpatient Hospital Stay: Admission: RE | Admit: 2021-10-20 | Payer: BC Managed Care – PPO | Source: Ambulatory Visit

## 2021-10-21 ENCOUNTER — Encounter
Admission: RE | Admit: 2021-10-21 | Discharge: 2021-10-21 | Disposition: A | Discharge status: Home / Self Care | Payer: BC Managed Care – PPO | Source: Ambulatory Visit | Attending: Vascular Surgery | Admitting: Vascular Surgery

## 2021-10-21 ENCOUNTER — Encounter: Payer: Self-pay | Admitting: Urgent Care

## 2021-10-21 ENCOUNTER — Other Ambulatory Visit (INDEPENDENT_AMBULATORY_CARE_PROVIDER_SITE_OTHER): Payer: Self-pay | Admitting: Nurse Practitioner

## 2021-10-21 VITALS — Ht 64.0 in | Wt 339.5 lb

## 2021-10-21 DIAGNOSIS — N186 End stage renal disease: Secondary | ICD-10-CM | POA: Diagnosis not present

## 2021-10-21 DIAGNOSIS — E875 Hyperkalemia: Secondary | ICD-10-CM

## 2021-10-21 DIAGNOSIS — Z01818 Encounter for other preprocedural examination: Secondary | ICD-10-CM | POA: Insufficient documentation

## 2021-10-21 DIAGNOSIS — E1165 Type 2 diabetes mellitus with hyperglycemia: Secondary | ICD-10-CM

## 2021-10-21 HISTORY — DX: Other disorders of lung: J98.4

## 2021-10-21 LAB — CBC WITH DIFFERENTIAL/PLATELET
Abs Immature Granulocytes: 0.03 10*3/uL (ref 0.00–0.07)
Basophils Absolute: 0 10*3/uL (ref 0.0–0.1)
Basophils Relative: 0 %
Eosinophils Absolute: 0.3 10*3/uL (ref 0.0–0.5)
Eosinophils Relative: 3 %
HCT: 38.8 % (ref 36.0–46.0)
Hemoglobin: 12.2 g/dL (ref 12.0–15.0)
Immature Granulocytes: 0 %
Lymphocytes Relative: 18 %
Lymphs Abs: 1.5 10*3/uL (ref 0.7–4.0)
MCH: 33.5 pg (ref 26.0–34.0)
MCHC: 31.4 g/dL (ref 30.0–36.0)
MCV: 106.6 fL — ABNORMAL HIGH (ref 80.0–100.0)
Monocytes Absolute: 0.7 10*3/uL (ref 0.1–1.0)
Monocytes Relative: 9 %
Neutro Abs: 5.6 10*3/uL (ref 1.7–7.7)
Neutrophils Relative %: 70 %
Platelets: 204 10*3/uL (ref 150–400)
RBC: 3.64 MIL/uL — ABNORMAL LOW (ref 3.87–5.11)
RDW: 15.5 % (ref 11.5–15.5)
WBC: 8.1 10*3/uL (ref 4.0–10.5)
nRBC: 0 % (ref 0.0–0.2)

## 2021-10-21 LAB — BASIC METABOLIC PANEL
Anion gap: 15 (ref 5–15)
BUN: 44 mg/dL — ABNORMAL HIGH (ref 6–20)
CO2: 27 mmol/L (ref 22–32)
Calcium: 9.4 mg/dL (ref 8.9–10.3)
Chloride: 93 mmol/L — ABNORMAL LOW (ref 98–111)
Creatinine, Ser: 6.71 mg/dL — ABNORMAL HIGH (ref 0.44–1.00)
GFR, Estimated: 7 mL/min — ABNORMAL LOW (ref >60)
Glucose, Bld: 233 mg/dL — ABNORMAL HIGH (ref 70–99)
Potassium: 4.9 mmol/L (ref 3.5–5.1)
Sodium: 135 mmol/L (ref 135–145)

## 2021-10-21 LAB — TYPE AND SCREEN
ABO/RH(D): O NEG
Antibody Screen: NEGATIVE

## 2021-10-21 NOTE — Patient Instructions (Addendum)
Your procedure is scheduled on: 10/26/21 Report to [place]. To find out your arrival time please call [phone] between 1PM - 3PM on 10/25/21.  Remember: Instructions that are not followed completely may result in serious medical risk, up to and including death, or upon the discretion of your surgeon and anesthesiologist your surgery may need to be rescheduled.     _X__ 1. Do not eat food or drink any liquids after midnight the night before your procedure.                 No gum chewing or hard candies.   __X__2.  On the morning of surgery brush your teeth with toothpaste and water, you                 may rinse your mouth with mouthwash if you wish.  Do not swallow any              toothpaste of mouthwash.     _X__ 3.  No Alcohol for 24 hours before or after surgery.   _X__ 4.  Do Not Smoke or use e-cigarettes For 24 Hours Prior to Your Surgery.                 Do not use any chewable tobacco products for at least 6 hours prior to                 surgery.  ____  5.  Bring all medications with you on the day of surgery if instructed.   __X__  6.  Notify your doctor if there is any change in your medical condition      (cold, fever, infections).     Do not wear jewelry, make-up, hairpins, clips or nail polish. Do not wear lotions, powders, or perfumes.  Do not shave 48 hours prior to surgery. Men may shave face and neck. Do not bring valuables to the hospital.    [facility] is not responsible for any belongings or valuables.  Contacts, dentures/partials or body piercings may not be worn into surgery. Bring a case for your contacts, glasses or hearing aids, a denture cup will be supplied. Leave your suitcase in the car. After surgery it may be brought to your room. For patients admitted to the hospital, discharge time is determined by your treatment team.   Patients discharged the day of surgery will not be allowed to drive  home.   Please read over the following fact sheets that you were given:   MRSA Information, Sage wipes  __X__ Take these medicines the morning of surgery with A SIP OF WATER:    1. amLODipine (NORVASC) 5 MG tablet  2. cloNIDine (CATAPRES) 0.2 MG tablet  3. levothyroxine (SYNTHROID) 112 MCG tablet  4. metoprolol succinate (TOPROL-XL) 25 MG 24 hr tablet  5.  6.  ____ Fleet Enema (as directed)   __X__ Use CHG Soap/SAGE wipes as directed  __X__ Use your ADVAIR inhalers on the day of surgery Bring Albuterol rescue inhaler with you  ____ Stop metformin/Janumet/Farxiga 2 days prior to surgery    ____ Take 1/2 of usual insulin dose the night before surgery. No insulin the morning          of surgery.   __X__ Last dose of Eliquis Sunday morning 10/23/21 restart as directed following your prcedure  __X__ Stop Anti-inflammatories 7 days before surgery such as Advil, Ibuprofen, Motrin,  BC or  Goodies Powder, Naprosyn, Naproxen, Aleve, Aspirin    __X__ Stop all herbal s andsupplements, fish oil or vitamins  until after surgery.    __X__ Bring C-Pap to the hospital. (Trilogy)

## 2021-10-23 DIAGNOSIS — I5032 Chronic diastolic (congestive) heart failure: Secondary | ICD-10-CM | POA: Diagnosis not present

## 2021-10-23 DIAGNOSIS — I482 Chronic atrial fibrillation, unspecified: Secondary | ICD-10-CM | POA: Diagnosis not present

## 2021-10-23 DIAGNOSIS — J9601 Acute respiratory failure with hypoxia: Secondary | ICD-10-CM | POA: Diagnosis not present

## 2021-10-23 DIAGNOSIS — G4733 Obstructive sleep apnea (adult) (pediatric): Secondary | ICD-10-CM | POA: Diagnosis not present

## 2021-10-24 ENCOUNTER — Inpatient Hospital Stay (HOSPITAL_BASED_OUTPATIENT_CLINIC_OR_DEPARTMENT_OTHER): Payer: BC Managed Care – PPO | Admitting: Medical Oncology

## 2021-10-24 ENCOUNTER — Other Ambulatory Visit: Payer: Self-pay

## 2021-10-24 ENCOUNTER — Telehealth: Payer: Self-pay | Admitting: *Deleted

## 2021-10-24 ENCOUNTER — Inpatient Hospital Stay: Payer: BC Managed Care – PPO | Attending: Oncology

## 2021-10-24 VITALS — BP 93/47 | HR 100 | Temp 98.2°F | Resp 16

## 2021-10-24 DIAGNOSIS — Z79899 Other long term (current) drug therapy: Secondary | ICD-10-CM | POA: Insufficient documentation

## 2021-10-24 DIAGNOSIS — Z923 Personal history of irradiation: Secondary | ICD-10-CM | POA: Diagnosis not present

## 2021-10-24 DIAGNOSIS — I959 Hypotension, unspecified: Secondary | ICD-10-CM | POA: Diagnosis not present

## 2021-10-24 DIAGNOSIS — B029 Zoster without complications: Secondary | ICD-10-CM | POA: Diagnosis not present

## 2021-10-24 DIAGNOSIS — Z86711 Personal history of pulmonary embolism: Secondary | ICD-10-CM | POA: Insufficient documentation

## 2021-10-24 DIAGNOSIS — Z853 Personal history of malignant neoplasm of breast: Secondary | ICD-10-CM | POA: Insufficient documentation

## 2021-10-24 DIAGNOSIS — C50911 Malignant neoplasm of unspecified site of right female breast: Secondary | ICD-10-CM

## 2021-10-24 DIAGNOSIS — Z7901 Long term (current) use of anticoagulants: Secondary | ICD-10-CM | POA: Insufficient documentation

## 2021-10-24 HISTORY — DX: Zoster without complications: B02.9

## 2021-10-24 LAB — CBC
HCT: 37.2 % (ref 36.0–46.0)
Hemoglobin: 11.9 g/dL — ABNORMAL LOW (ref 12.0–15.0)
MCH: 34.2 pg — ABNORMAL HIGH (ref 26.0–34.0)
MCHC: 32 g/dL (ref 30.0–36.0)
MCV: 106.9 fL — ABNORMAL HIGH (ref 80.0–100.0)
Platelets: 179 10*3/uL (ref 150–400)
RBC: 3.48 MIL/uL — ABNORMAL LOW (ref 3.87–5.11)
RDW: 15.4 % (ref 11.5–15.5)
WBC: 10.1 10*3/uL (ref 4.0–10.5)
nRBC: 0 % (ref 0.0–0.2)

## 2021-10-24 LAB — BASIC METABOLIC PANEL
Anion gap: 17 — ABNORMAL HIGH (ref 5–15)
BUN: 69 mg/dL — ABNORMAL HIGH (ref 6–20)
CO2: 24 mmol/L (ref 22–32)
Calcium: 9.3 mg/dL (ref 8.9–10.3)
Chloride: 93 mmol/L — ABNORMAL LOW (ref 98–111)
Creatinine, Ser: 8.67 mg/dL — ABNORMAL HIGH (ref 0.44–1.00)
GFR, Estimated: 5 mL/min — ABNORMAL LOW (ref >60)
Glucose, Bld: 242 mg/dL — ABNORMAL HIGH (ref 70–99)
Potassium: 5 mmol/L (ref 3.5–5.1)
Sodium: 134 mmol/L — ABNORMAL LOW (ref 135–145)

## 2021-10-24 MED ORDER — VALACYCLOVIR HCL 500 MG PO TABS: 500.0000 mg | ORAL_TABLET | Freq: Every day | ORAL | 0 refills | Status: AC

## 2021-10-24 MED ORDER — DOXYCYCLINE HYCLATE 100 MG PO TABS: 100.0000 mg | ORAL_TABLET | Freq: Two times a day (BID) | ORAL | 0 refills | Status: AC

## 2021-10-24 NOTE — Progress Notes (Addendum)
Symptom Management [place] at [address] [facility] [phone] [phone]  Patient Care Team: Lenard Simmer, MD as PCP - General (Endocrinology) Wellington Hampshire, MD as PCP - Cardiology (Cardiology) Christene Lye, MD (General Surgery) Alisa Graff, FNP as Nurse Practitioner (Family Medicine) Anthonette Legato, MD as Consulting Physician (Nephrology) Sindy Guadeloupe, MD as Consulting Physician (Hematology and Oncology)   Name of the patient: Nicole Schmidt  [id number]  [birth year]   Date of visit: 10/24/21  Reason for Consult: Nicole Schmidt is a 56 y.o. female with a history of stage 1 ER/PR +, HER2- right breast cancer (2014) who presents today for:  Breast pain: Pt reports that starting yesterday she noticed a red stingy rash of her right breast. Extends to her underarm. Red and painful to touch. No breast texture change, discharge, fevers. She is UTD on her mammograms (Oct 2022) which have been benign appearing. She has not tried anything for symptoms. She is a non-smoker.   Hypotension: Chronic for patient. Typically holds her clonidine on dialysis days but took this today- has midodrine to use PRN. Asymptomatic.   Denies any neurologic complaints. Denies recent fevers or illnesses. Denies any easy bleeding or bruising. Reports good appetite and denies weight loss. Denies chest pain. Denies any nausea, vomiting, constipation, or diarrhea. Denies urinary complaints. Patient offers no further specific complaints today.    PAST MEDICAL HISTORY: Past Medical History:  Diagnosis Date   (HFpEF) heart failure with preserved ejection fraction ([place])    a. 10/2018 Echo: EF 60-65%, diast dysfxn, RVSP 50.7 mmHg; b. 01/2020 Echo: EF 65-70%, G2DD; c. 01/2021 Echo: EF 60-65%, no rwma, mod LVH. Nl RV fxn, mildly enlarged RV. Mod BAE. Triv MR. d. 05/2021 Echo: EF 55-60; no RWMAs, mild LVH, mild TR.   Acquired hypothyroidism 02/09/2020    Anemia    Aortic atherosclerosis (HCC)    Arthritis    Breast cancer of upper-inner quadrant of right female breast ([place])    Right breast invasive CA and DCIS , 7 mm T1,N0,M0. Er/PR pos, her 2 negative.  Margins 1 mm.   COPD (chronic obstructive pulmonary disease) (HCC)    Diabetes mellitus type 2 in obese ([place]) 02/09/2020   Dyslipidemia 02/09/2020   ESBL (extended spectrum beta-lactamase) producing bacteria infection 03/20/2021   a. urine culture (+) for ESBL producing E.coli and P. mirabilis   ESRD (end stage renal disease) on dialysis ([place])    a. T-Th-Sat HD initiated 02/2021   Essential hypertension    Gout    History of kidney stones    Menopause    age 5   Morbid obesity ([place])    OSA treated with BiPAP    a. on nocturnal PAP therapy; Trilogy 100   Permanent atrial fibrillation ([name])    a. Dx 10/2018 in setting of PNA. b. CHA2DS2-VASc = 7 (sex, HFpEF, HTN, PE x2, aortic plaque, T2DM); c. s/p successful DCCV 11/2018. d. subsequent recurrent Afib; rate/rhythm maintained on oral metoprolol succinate; chronically anticoagulated using standard dose apixaban.   Personal history of radiation therapy    Pulmonary embolism ([place]) 10/2014   a. on chronic apixaban   Restrictive lung disease    Thyroid goiter     PAST SURGICAL HISTORY:  Past Surgical History:  Procedure Laterality Date   A/V FISTULAGRAM Left 09/02/2021   Procedure: A/V Fistulagram;  Surgeon: Katha Cabal, MD;  Location: [facility];  Service: Cardiovascular;  Laterality: Left;  AV FISTULA PLACEMENT Left 07/22/2021   Procedure: ARTERIOVENOUS (AV) FISTULA CREATION (RADIALCEPHALIC);  Surgeon: Katha Cabal, MD;  Location: [facility];  Service: Vascular;  Laterality: Left;   BREAST BIOPSY Right 2014   breast ca   BREAST EXCISIONAL BIOPSY Right 07/19/2012   breast ca   BREAST SURGERY Right 2014   with sentinel node bx subareaolar duct excision   CARDIOVERSION N/A 11/28/2018   Procedure: CARDIOVERSION;   Surgeon: Minna Merritts, MD;  Location: [facility];  Service: Cardiovascular;  Laterality: N/A;   COLONOSCOPY WITH PROPOFOL N/A 01/30/2017   Procedure: COLONOSCOPY WITH PROPOFOL;  Surgeon: Christene Lye, MD;  Location: [facility];  Service: Endoscopy;  Laterality: N/A;   CYSTOSCOPY W/ RETROGRADES  06/17/2021   Procedure: CYSTOSCOPY WITH RETROGRADE PYELOGRAM;  Surgeon: Billey Co, MD;  Location: [facility];  Service: Urology;;   CYSTOSCOPY W/ URETERAL STENT PLACEMENT Left 02/14/2021   Procedure: CYSTOSCOPY WITH RETROGRADE PYELOGRAM/URETERAL STENT PLACEMENT;  Surgeon: Billey Co, MD;  Location: [facility];  Service: Urology;  Laterality: Left;   CYSTOSCOPY/URETEROSCOPY/HOLMIUM LASER/STENT PLACEMENT Left 06/17/2021   Procedure: CYSTOSCOPY/URETEROSCOPY/HOLMIUM LASER/STENT EXCHANGE;  Surgeon: Billey Co, MD;  Location: [facility];  Service: Urology;  Laterality: Left;   DIALYSIS/PERMA CATHETER INSERTION N/A 02/24/2021   Procedure: DIALYSIS/PERMA CATHETER INSERTION;  Surgeon: Algernon Huxley, MD;  Location: [facility];  Service: Cardiovascular;  Laterality: N/A;   PERIPHERAL VASCULAR THROMBECTOMY Left 09/28/2021   Procedure: PERIPHERAL VASCULAR THROMBECTOMY;  Surgeon: Katha Cabal, MD;  Location: [facility];  Service: Cardiovascular;  Laterality: Left;    HEMATOLOGY/ONCOLOGY HISTORY:  Oncology History   No history exists.    ALLERGIES:  is allergic to spironolactone.  MEDICATIONS:  Current Outpatient Medications  Medication Sig Dispense Refill   doxycycline (VIBRA-TABS) 100 MG tablet Take 1 tablet (100 mg total) by mouth 2 (two) times daily for 10 days. 20 tablet 0   valACYclovir (VALTREX) 500 MG tablet Take 1 tablet (500 mg total) by mouth daily for 7 days. Give after dialysis on dialysis days. 7 tablet 0   acetaminophen (TYLENOL) 325 MG tablet Take 2 tablets (650 mg total) by mouth every 6 (six) hours as needed for mild pain (or Fever >/= 101).      ADVAIR DISKUS 250-50 MCG/ACT AEPB Inhale 1 puff into the lungs 2 (two) times daily.     albuterol (VENTOLIN HFA) 108 (90 Base) MCG/ACT inhaler Inhale 2 puffs into the lungs every 6 (six) hours as needed for wheezing or shortness of breath. 8.5 g 0   amLODipine (NORVASC) 5 MG tablet Take 1 tablet (5 mg total) by mouth daily. 90 tablet 3   apixaban (ELIQUIS) 5 MG TABS tablet Take 1 tablet (5 mg total) by mouth 2 (two) times daily. 180 tablet 1   atorvastatin (LIPITOR) 20 MG tablet Take 1 tablet (20 mg total) by mouth at bedtime. 90 tablet 3   calcitRIOL (ROCALTROL) 0.5 MCG capsule Take 0.5 mcg by mouth daily.     calcium carbonate (CALCIUM 600) 600 MG TABS tablet Take 600 mg by mouth daily with breakfast.     cloNIDine (CATAPRES) 0.2 MG tablet Take 0.2 mg by mouth 2 (two) times daily.     CRANBERRY PO Take 1 capsule by mouth daily.     diclofenac Sodium (VOLTAREN) 1 % GEL Apply 2 g topically 4 (four) times daily. (Patient not taking: Reported on 09/02/2021) 100 g 0   ergocalciferol (VITAMIN D2) 1.25 MG (50000 UT)  capsule Take 1 capsule (50,000 Units total) by mouth once a week. 5 capsule 0   ferric gluconate 250 mg in sodium chloride 0.9 % 250 mL Inject 250 mg into the vein Every Tuesday,Thursday,and Saturday with dialysis.     folic acid (FOLVITE) 1 MG tablet Take 1 tablet (1 mg total) by mouth daily. 30 tablet 0   gabapentin (NEURONTIN) 300 MG capsule Take 1 capsule (300 mg total) by mouth at bedtime. 30 capsule 0   HYDROcodone-acetaminophen (NORCO) 5-325 MG tablet Take 1-2 tablets by mouth every 6 (six) hours as needed. (Patient not taking: Reported on 09/02/2021) 25 tablet 0   Lancets (ONETOUCH DELICA PLUS [id number]) MISC      levothyroxine (SYNTHROID) 112 MCG tablet Take 1 tablet (112 mcg total) by mouth daily before breakfast. 30 tablet 0   melatonin 5 MG TABS Take 2 tablets (10 mg total) by mouth at bedtime as needed (sleep). 60 tablet 0   metoprolol succinate (TOPROL-XL) 25 MG 24 hr tablet Take  1 tablet (25 mg total) by mouth daily. Take with or immediately following a meal. 90 tablet 3   midodrine (PROAMATINE) 10 MG tablet Take 1 tablet (10 mg total) by mouth Every Tuesday,Thursday,and Saturday with dialysis. (Patient taking differently: Take 10 mg by mouth Every Tuesday,Thursday,and Saturday with dialysis. As needed) 30 tablet 0   ONETOUCH ULTRA test strip      senna (SENOKOT) 8.6 MG tablet Take 1 tablet by mouth as needed for constipation. (Patient not taking: Reported on 09/02/2021)     sevelamer carbonate (RENVELA) 800 MG tablet Take 1 tablet (800 mg total) by mouth 3 (three) times daily with meals. 90 tablet 0   traMADol (ULTRAM) 50 MG tablet Take 1 tablet (50 mg total) by mouth every 12 (twelve) hours as needed for moderate pain. 30 tablet 0   TRULICITY 1.5 YC/1.4GY SOPN Inject 1.5 mg into the skin every Monday.     No current facility-administered medications for this visit.    VITAL SIGNS: BP (!) 93/47   Pulse 100   Temp 98.2 F (36.8 C) (Tympanic)   Resp 16   LMP 07/17/2012   SpO2 97%  There were no vitals filed for this visit.  Estimated body mass index is 58.28 kg/m as calculated from the following:   Height as of 10/21/21: _0  (1.626 m).   Weight as of 10/21/21: 339 lb 8.1 oz (154 kg).  LABS: CBC:    Component Value Date/Time   WBC 10.1 10/24/2021 1402   HGB 11.9 (L) 10/24/2021 1402   HGB 9.9 (L) 02/27/2020 0931   HCT 37.2 10/24/2021 1402   HCT 32.2 (L) 02/27/2020 0931   PLT 179 10/24/2021 1402   PLT 232 02/27/2020 0931   MCV 106.9 (H) 10/24/2021 1402   MCV 89 02/27/2020 0931   MCV 90 03/09/2014 1533   NEUTROABS 5.6 10/21/2021 1340   NEUTROABS 7.7 (H) 03/09/2014 1533   LYMPHSABS 1.5 10/21/2021 1340   LYMPHSABS 1.5 03/09/2014 1533   MONOABS 0.7 10/21/2021 1340   MONOABS 0.6 03/09/2014 1533   EOSABS 0.3 10/21/2021 1340   EOSABS 0.2 03/09/2014 1533   BASOSABS 0.0 10/21/2021 1340   BASOSABS 0.1 03/09/2014 1533   Comprehensive Metabolic Panel:     Component Value Date/Time   NA 134 (L) 10/24/2021 1402   NA 144 02/27/2020 0931   NA 141 03/09/2014 1533   K 5.0 10/24/2021 1402   K 4.2 03/09/2014 1533   CL 93 (L) 10/24/2021 1402  CL 101 03/09/2014 1533   CO2 24 10/24/2021 1402   CO2 30 03/09/2014 1533   BUN 69 (H) 10/24/2021 1402   BUN 40 (H) 02/27/2020 0931   BUN 35 (H) 03/09/2014 1533   CREATININE 8.67 (H) 10/24/2021 1402   CREATININE 1.50 (H) 03/09/2014 1533   GLUCOSE 242 (H) 10/24/2021 1402   GLUCOSE 95 03/09/2014 1533   CALCIUM 9.3 10/24/2021 1402   CALCIUM 8.8 03/09/2014 1533   AST 17 07/26/2021 2222   AST 16 03/09/2014 1533   ALT 6 07/26/2021 2222   ALT 23 03/09/2014 1533   ALKPHOS 162 (H) 07/26/2021 2222   ALKPHOS 113 03/09/2014 1533   BILITOT 0.8 07/26/2021 2222   BILITOT 0.3 03/09/2014 1533   PROT 7.5 07/26/2021 2222   PROT 7.3 03/09/2014 1533   ALBUMIN 3.6 07/26/2021 2222   ALBUMIN 3.5 03/09/2014 1533    RADIOGRAPHIC STUDIES: VAS [name] UPPER EXTREMITY ARTERIAL DUPLEX  Result Date: 10/10/2021  UPPER EXTREMITY DUPLEX STUDY Patient Name:  Nicole Schmidt  Date of Exam:   10/10/2021 Medical Rec #: [id number]       Accession #:    [id number] Date of Birth: 1965/07/25        Patient Gender: F Patient Age:   13 years Exam Location:  [facility] Procedure:      VAS [name] UPPER EXTREMITY ARTERIAL DUPLEX Referring Phys: [name] --------------------------------------------------------------------------------  Indications: Pre-Assessment for HDA.  Performing Technologist: Almira Coaster RVS  Examination Guidelines: A complete evaluation includes B-mode imaging, spectral Doppler, color Doppler, and power Doppler as needed of all accessible portions of each vessel. Bilateral testing is considered an integral part of a complete examination. Limited examinations for reoccurring indications may be performed as noted.  Left Doppler Findings: +---------------+----------+---------+--------+--------+ Site           PSV  (cm/s)Waveform StenosisComments +---------------+----------+---------+--------+--------+ Subclavian Prox73        triphasic                 +---------------+----------+---------+--------+--------+ Subclavian Dist93        triphasic                 +---------------+----------+---------+--------+--------+ Axillary       98        triphasic                 +---------------+----------+---------+--------+--------+ Brachial Prox  104       triphasic                 +---------------+----------+---------+--------+--------+ Brachial Mid   67        triphasic                 +---------------+----------+---------+--------+--------+ Brachial Dist  65        triphasic                 +---------------+----------+---------+--------+--------+ Radial Prox    72        triphasic                 +---------------+----------+---------+--------+--------+   Summary:  Left: The Left Brachial Artery measures .45 cms, Brachial Artery ACF       .45 cms and Radial Proximal segment measures .31 cms. *See table(s) above for measurements and observations. Electronically signed by [name] MD on 10/10/2021 at 5:25:39 PM.    Final    PERIPHERAL VASCULAR CATHETERIZATION  Result Date: 09/28/2021 See surgical note for result.   PERFORMANCE STATUS (ECOG) : 1 -  Symptomatic but completely ambulatory  Review of Systems Unless otherwise noted, a complete review of systems is negative.  Physical Exam General: NAD Cardiovascular: regular rate and rhythm Pulmonary: clear ant fields Abdomen: soft, nontender, + bowel sounds GU: no suprapubic tenderness Extremities: no edema, no joint deformities Skin: Erythematic slightly raised rash of the right T4 dermatome. See below. Skin is warm to touch. Breast: No nipple discharge. No new texture change- s/p radiation skin changes Neurological: intact      Assessment and Plan- Patient is a 56 y.o. female    Encounter Diagnosis  Name Primary?    Herpes zoster without complication Yes   New. Appears to be shingles. Discussed with patient including common treatments and prevention of spreading chicken pox to others. Treating with Valacyclovir- dose adjusted- along with doxycyline given how warm and erythematic the breast area is. Discussed common potential side effects and precautions. Discussed red flag signs and symptoms. She will call her PCP, Nephrologist and pre-op to alert them to her Dx.   Hypotension: Chronic. She is asymptomatic. She will monitor at home and follow parameters for using her midodrine PRN. Continue close follow up with PCP and cardiology.     Patient expressed understanding and was in agreement with this plan. She also understands that She can call clinic at any time with any questions, concerns, or complaints.   Thank you for allowing me to participate in the care of this very pleasant patient.   Time Total: 30  Visit consisted of counseling and education dealing with the complex and emotionally intense issues of symptom management in the setting of serious illness.Greater than 50%  of this time was spent counseling and coordinating care related to the above assessment and plan.  Signed by: Nelwyn Salisbury, PA-C

## 2021-10-24 NOTE — Telephone Encounter (Signed)
Pt contacted and offered appointment in [facility]. Pt agreeable. Appointments scheduled.

## 2021-10-24 NOTE — Progress Notes (Signed)
Has upcoming surgery on Wednesday for dialysis fistula. Pt concerned about right breast infection.Notes redness and tenderness since Saturday. Denies fever or any discharge from breast.

## 2021-10-24 NOTE — Telephone Encounter (Signed)
Patient called reporting that her right breast (which she had cancer in) is red, sore, and warm to touch. She noted this Saturday while at dialysis and was shivering cold. This was the only time that she had chills. She denies any flu like symptoms. She is asking to come in to be seen today because she has dialysis tomorrow all day. She is also concerned because she is scheduled to have her fistula created on Wednesday and she needs to know if she needs to cancel it. She thinks she may have mastitis.Please advise

## 2021-10-24 NOTE — Telephone Encounter (Signed)
Patient called reporting that her surgery has been cancelled and she was told that she will need to be cleared from her shingles by a provider before they will reschedule her. Sh estates she does not know how long it will take to recover from shingles, but that she will need to see Judson Roch when it clears to be cleared for surgery and she just wanted that documented and for Judson Roch to be aware

## 2021-10-26 ENCOUNTER — Encounter: Admission: RE | Payer: Self-pay | Source: Ambulatory Visit

## 2021-10-26 ENCOUNTER — Ambulatory Visit
Admission: RE | Admit: 2021-10-26 | Payer: BC Managed Care – PPO | Source: Ambulatory Visit | Admitting: Vascular Surgery

## 2021-10-26 SURGERY — ARTERIOVENOUS (AV) FISTULA CREATION
Anesthesia: General | Laterality: Left

## 2021-11-04 DIAGNOSIS — I10 Essential (primary) hypertension: Secondary | ICD-10-CM | POA: Diagnosis not present

## 2021-11-04 DIAGNOSIS — E785 Hyperlipidemia, unspecified: Secondary | ICD-10-CM | POA: Diagnosis not present

## 2021-11-04 DIAGNOSIS — Z6841 Body Mass Index (BMI) 40.0 and over, adult: Secondary | ICD-10-CM | POA: Diagnosis not present

## 2021-11-04 DIAGNOSIS — I480 Paroxysmal atrial fibrillation: Secondary | ICD-10-CM | POA: Diagnosis not present

## 2021-11-04 DIAGNOSIS — E1165 Type 2 diabetes mellitus with hyperglycemia: Secondary | ICD-10-CM | POA: Diagnosis not present

## 2021-11-12 DIAGNOSIS — Z992 Dependence on renal dialysis: Secondary | ICD-10-CM | POA: Diagnosis not present

## 2021-11-12 DIAGNOSIS — N186 End stage renal disease: Secondary | ICD-10-CM | POA: Diagnosis not present

## 2021-11-14 ENCOUNTER — Encounter: Payer: Self-pay | Admitting: Medical Oncology

## 2021-11-14 ENCOUNTER — Inpatient Hospital Stay: Payer: BC Managed Care – PPO | Attending: Oncology | Admitting: Medical Oncology

## 2021-11-14 VITALS — BP 110/73 | HR 86 | Temp 97.5°F | Ht 64.0 in | Wt 348.0 lb

## 2021-11-14 DIAGNOSIS — B029 Zoster without complications: Secondary | ICD-10-CM | POA: Diagnosis not present

## 2021-11-14 DIAGNOSIS — C50911 Malignant neoplasm of unspecified site of right female breast: Secondary | ICD-10-CM

## 2021-11-14 DIAGNOSIS — Z170 Estrogen receptor positive status [ER+]: Secondary | ICD-10-CM | POA: Diagnosis not present

## 2021-11-14 DIAGNOSIS — Z8619 Personal history of other infectious and parasitic diseases: Secondary | ICD-10-CM | POA: Diagnosis not present

## 2021-11-14 NOTE — Progress Notes (Signed)
Symptom Management [place] at [facility] [phone] [phone]  Patient Care Team: Lenard Simmer, MD as PCP - General (Endocrinology) Wellington Hampshire, MD as PCP - Cardiology (Cardiology) Christene Lye, MD (General Surgery) Alisa Graff, FNP as Nurse Practitioner (Family Medicine) Anthonette Legato, MD as Consulting Physician (Nephrology) Sindy Guadeloupe, MD as Consulting Physician (Hematology and Oncology)   Name of the patient: Nicole Schmidt  [id number]  07-28-1965   Date of visit: 11/14/21  Reason for Consult: Nicole Schmidt is a 56 y.o. female who presents today for:  Shingles recheck: Patient was diagnosed with suspected shingles on 10/24/2021. She was placed on valtrex. There was excessive breast redness so we covered with doxycyline for any mastitis as well. She reports that she tolerated these medications well without any side effects. Over the next few days her symptoms resolved. Now back to baseline. No pain, fever, discharge. She reports that her vascular surgeon canceled her AV fistula surgery due to this illness and asks if she is cleared for this surgery from a shingles/potential mastitis perspective.   Denies any neurologic complaints. Denies recent fevers or illnesses. Denies any easy bleeding or bruising. Reports good appetite and denies weight loss. Denies chest pain. Denies any nausea, vomiting, constipation, or diarrhea. Denies urinary complaints. Patient offers no further specific complaints today.    PAST MEDICAL HISTORY: Past Medical History:  Diagnosis Date   (HFpEF) heart failure with preserved ejection fraction ([place])    a. 10/2018 Echo: EF 60-65%, diast dysfxn, RVSP 50.7 mmHg; b. 01/2020 Echo: EF 65-70%, G2DD; c. 01/2021 Echo: EF 60-65%, no rwma, mod LVH. Nl RV fxn, mildly enlarged RV. Mod BAE. Triv MR. d. 05/2021 Echo: EF 55-60; no RWMAs, mild LVH, mild TR.   Acquired hypothyroidism  02/09/2020   Anemia    Aortic atherosclerosis (HCC)    Arthritis    Breast cancer of upper-inner quadrant of right female breast ([place])    Right breast invasive CA and DCIS , 7 mm T1,N0,M0. Er/PR pos, her 2 negative.  Margins 1 mm.   COPD (chronic obstructive pulmonary disease) (HCC)    Diabetes mellitus type 2 in obese ([place]) 02/09/2020   Dyslipidemia 02/09/2020   ESBL (extended spectrum beta-lactamase) producing bacteria infection 03/20/2021   a. urine culture (+) for ESBL producing E.coli and P. mirabilis   ESRD (end stage renal disease) on dialysis ([place])    a. T-Th-Sat HD initiated 02/2021   Essential hypertension    Gout    History of kidney stones    Menopause    age 76   Morbid obesity ([place])    OSA treated with BiPAP    a. on nocturnal PAP therapy; Trilogy 100   Permanent atrial fibrillation ([place])    a. Dx 10/2018 in setting of PNA. b. CHA2DS2-VASc = 7 (sex, HFpEF, HTN, PE x2, aortic plaque, T2DM); c. s/p successful DCCV 11/2018. d. subsequent recurrent Afib; rate/rhythm maintained on oral metoprolol succinate; chronically anticoagulated using standard dose apixaban.   Personal history of radiation therapy    Pulmonary embolism ([place]) 10/2014   a. on chronic apixaban   Restrictive lung disease    Thyroid goiter     PAST SURGICAL HISTORY:  Past Surgical History:  Procedure Laterality Date   A/V FISTULAGRAM Left 09/02/2021   Procedure: A/V Fistulagram;  Surgeon: Katha Cabal, MD;  Location: [place] [facility];  Service: Cardiovascular;  Laterality: Left;   AV FISTULA PLACEMENT Left 07/22/2021  Procedure: ARTERIOVENOUS (AV) FISTULA CREATION (RADIALCEPHALIC);  Surgeon: Katha Cabal, MD;  Location: [facility];  Service: Vascular;  Laterality: Left;   BREAST BIOPSY Right 2014   breast ca   BREAST EXCISIONAL BIOPSY Right 07/19/2012   breast ca   BREAST SURGERY Right 2014   with sentinel node bx subareaolar duct excision   CARDIOVERSION N/A 11/28/2018   Procedure:  CARDIOVERSION;  Surgeon: Minna Merritts, MD;  Location: [facility] OR[facility];  Service: Cardiovascular;  Laterality: N/A;   COLONOSCOPY WITH PROPOFOL N/A 01/30/2017   Procedure: COLONOSCOPY WITH PROPOFOL;  Surgeon: Christene Lye, MD;  Location: [facility];  Service: Endoscopy;  Laterality: N/A;   CYSTOSCOPY W/ RETROGRADES  06/17/2021   Procedure: CYSTOSCOPY WITH RETROGRADE PYELOGRAM;  Surgeon: Billey Co, MD;  Location: [facility];  Service: Urology;;   CYSTOSCOPY W/ URETERAL STENT PLACEMENT Left 02/14/2021   Procedure: CYSTOSCOPY WITH RETROGRADE PYELOGRAM/URETERAL STENT PLACEMENT;  Surgeon: Billey Co, MD;  Location: [facility];  Service: Urology;  Laterality: Left;   CYSTOSCOPY/URETEROSCOPY/HOLMIUM LASER/STENT PLACEMENT Left 06/17/2021   Procedure: CYSTOSCOPY/URETEROSCOPY/HOLMIUM LASER/STENT EXCHANGE;  Surgeon: Billey Co, MD;  Location: [facility];  Service: Urology;  Laterality: Left;   DIALYSIS/PERMA CATHETER INSERTION N/A 02/24/2021   Procedure: DIALYSIS/PERMA CATHETER INSERTION;  Surgeon: Algernon Huxley, MD;  Location: [place] [facility];  Service: Cardiovascular;  Laterality: N/A;   PERIPHERAL VASCULAR THROMBECTOMY Left 09/28/2021   Procedure: PERIPHERAL VASCULAR THROMBECTOMY;  Surgeon: Katha Cabal, MD;  Location: [place] [facility];  Service: Cardiovascular;  Laterality: Left;    HEMATOLOGY/ONCOLOGY HISTORY:  Oncology History   No history exists.    ALLERGIES:  is allergic to spironolactone.  MEDICATIONS:  Current Outpatient Medications  Medication Sig Dispense Refill   acetaminophen (TYLENOL) 325 MG tablet Take 2 tablets (650 mg total) by mouth every 6 (six) hours as needed for mild pain (or Fever >/= 101).     ADVAIR DISKUS 250-50 MCG/ACT AEPB Inhale 1 puff into the lungs 2 (two) times daily.     albuterol (VENTOLIN HFA) 108 (90 Base) MCG/ACT inhaler Inhale 2 puffs into the lungs every 6 (six) hours as needed for wheezing or shortness of breath. 8.5 g 0    amLODipine (NORVASC) 5 MG tablet Take 1 tablet (5 mg total) by mouth daily. 90 tablet 3   apixaban (ELIQUIS) 5 MG TABS tablet Take 1 tablet (5 mg total) by mouth 2 (two) times daily. 180 tablet 1   atorvastatin (LIPITOR) 20 MG tablet Take 1 tablet (20 mg total) by mouth at bedtime. 90 tablet 3   calcitRIOL (ROCALTROL) 0.5 MCG capsule Take 0.5 mcg by mouth daily.     calcium carbonate (CALCIUM 600) 600 MG TABS tablet Take 600 mg by mouth daily with breakfast.     cloNIDine (CATAPRES) 0.2 MG tablet Take 0.2 mg by mouth 2 (two) times daily.     CRANBERRY PO Take 1 capsule by mouth daily.     ergocalciferol (VITAMIN D2) 1.25 MG (50000 UT) capsule Take 1 capsule (50,000 Units total) by mouth once a week. 5 capsule 0   ferric gluconate 250 mg in sodium chloride 0.9 % 250 mL Inject 250 mg into the vein Every Tuesday,Thursday,and Saturday with dialysis.     folic acid (FOLVITE) 1 MG tablet Take 1 tablet (1 mg total) by mouth daily. 30 tablet 0   gabapentin (NEURONTIN) 300 MG capsule Take 1 capsule (300 mg total) by mouth at bedtime. 30 capsule 0   Lancets (ONETOUCH  DELICA PLUS [id number]) MISC      levothyroxine (SYNTHROID) 112 MCG tablet Take 1 tablet (112 mcg total) by mouth daily before breakfast. 30 tablet 0   melatonin 5 MG TABS Take 2 tablets (10 mg total) by mouth at bedtime as needed (sleep). 60 tablet 0   metoprolol succinate (TOPROL-XL) 25 MG 24 hr tablet Take 1 tablet (25 mg total) by mouth daily. Take with or immediately following a meal. 90 tablet 3   midodrine (PROAMATINE) 10 MG tablet Take 1 tablet (10 mg total) by mouth Every Tuesday,Thursday,and Saturday with dialysis. (Patient taking differently: Take 10 mg by mouth Every Tuesday,Thursday,and Saturday with dialysis. As needed) 30 tablet 0   ONETOUCH ULTRA test strip      sevelamer carbonate (RENVELA) 800 MG tablet Take 1 tablet (800 mg total) by mouth 3 (three) times daily with meals. 90 tablet 0   traMADol (ULTRAM) 50 MG tablet Take 1  tablet (50 mg total) by mouth every 12 (twelve) hours as needed for moderate pain. 30 tablet 0   TRULICITY 1.5 UX/3.2TF SOPN Inject 1.5 mg into the skin every Monday.     diclofenac Sodium (VOLTAREN) 1 % GEL Apply 2 g topically 4 (four) times daily. (Patient not taking: Reported on 09/02/2021) 100 g 0   HYDROcodone-acetaminophen (NORCO) 5-325 MG tablet Take 1-2 tablets by mouth every 6 (six) hours as needed. (Patient not taking: Reported on 09/02/2021) 25 tablet 0   senna (SENOKOT) 8.6 MG tablet Take 1 tablet by mouth as needed for constipation. (Patient not taking: Reported on 09/02/2021)     No current facility-administered medications for this visit.    VITAL SIGNS: BP 110/73 (BP Location: Right Arm, Patient Position: Sitting)   Pulse 86   Temp (!) 97.5 F (36.4 C) (Tympanic)   Ht '5\' 4"'$  (1.626 m)   Wt (!) 348 lb (157.9 kg)   LMP 07/17/2012   BMI 59.73 kg/m  Filed Weights   11/14/21 1332  Weight: (!) 348 lb (157.9 kg)    Estimated body mass index is 59.73 kg/m as calculated from the following:   Height as of this encounter: '5\' 4"'$  (1.626 m).   Weight as of this encounter: 348 lb (157.9 kg).  LABS: CBC:    Component Value Date/Time   WBC 10.1 10/24/2021 1402   HGB 11.9 (L) 10/24/2021 1402   HGB 9.9 (L) 02/27/2020 0931   HCT 37.2 10/24/2021 1402   HCT 32.2 (L) 02/27/2020 0931   PLT 179 10/24/2021 1402   PLT 232 02/27/2020 0931   MCV 106.9 (H) 10/24/2021 1402   MCV 89 02/27/2020 0931   MCV 90 03/09/2014 1533   NEUTROABS 5.6 10/21/2021 1340   NEUTROABS 7.7 (H) 03/09/2014 1533   LYMPHSABS 1.5 10/21/2021 1340   LYMPHSABS 1.5 03/09/2014 1533   MONOABS 0.7 10/21/2021 1340   MONOABS 0.6 03/09/2014 1533   EOSABS 0.3 10/21/2021 1340   EOSABS 0.2 03/09/2014 1533   BASOSABS 0.0 10/21/2021 1340   BASOSABS 0.1 03/09/2014 1533   Comprehensive Metabolic Panel:    Component Value Date/Time   NA 134 (L) 10/24/2021 1402   NA 144 02/27/2020 0931   NA 141 03/09/2014 1533   K 5.0  10/24/2021 1402   K 4.2 03/09/2014 1533   CL 93 (L) 10/24/2021 1402   CL 101 03/09/2014 1533   CO2 24 10/24/2021 1402   CO2 30 03/09/2014 1533   BUN 69 (H) 10/24/2021 1402   BUN 40 (H) 02/27/2020 5732  BUN 35 (H) 03/09/2014 1533   CREATININE 8.67 (H) 10/24/2021 1402   CREATININE 1.50 (H) 03/09/2014 1533   GLUCOSE 242 (H) 10/24/2021 1402   GLUCOSE 95 03/09/2014 1533   CALCIUM 9.3 10/24/2021 1402   CALCIUM 8.8 03/09/2014 1533   AST 17 07/26/2021 2222   AST 16 03/09/2014 1533   ALT 6 07/26/2021 2222   ALT 23 03/09/2014 1533   ALKPHOS 162 (H) 07/26/2021 2222   ALKPHOS 113 03/09/2014 1533   BILITOT 0.8 07/26/2021 2222   BILITOT 0.3 03/09/2014 1533   PROT 7.5 07/26/2021 2222   PROT 7.3 03/09/2014 1533   ALBUMIN 3.6 07/26/2021 2222   ALBUMIN 3.5 03/09/2014 1533    RADIOGRAPHIC STUDIES: No results found.  PERFORMANCE STATUS (ECOG) : 1 - Symptomatic but completely ambulatory  Review of Systems Unless otherwise noted, a complete review of systems is negative.  Physical Exam General: NAD Cardiovascular: regular rate and rhythm Pulmonary: clear ant fields Abdomen: soft, nontender, + bowel sounds GU: no suprapubic tenderness Extremities: no edema, no joint deformities Skin: no rashes, discharge, erythema Neurological: Weakness but otherwise nonfocal  Assessment and Plan- Patient is a 56 y.o. female  Encounter Diagnoses  Name Primary?   Malignant neoplasm of right breast in female, estrogen receptor positive, unspecified site of breast ([place]) Yes   Herpes zoster without complication       Herpes Zoster: Resolved. No sign of continued illness. From a shingles perspective she is cleared for surgery. She will alert me should symptoms continues. She is UTD on her vaccines.    Patient expressed understanding and was in agreement with this plan. She also understands that She can call clinic at any time with any questions, concerns, or complaints.   Thank you for allowing me to  participate in the care of this very pleasant patient.   Time Total: 10  Visit consisted of counseling and education dealing with the complex and emotionally intense issues of symptom management in the setting of serious illness.Greater than 50%  of this time was spent counseling and coordinating care related to the above assessment and plan.  Signed by: Nelwyn Salisbury, PA-C

## 2021-11-18 ENCOUNTER — Telehealth (INDEPENDENT_AMBULATORY_CARE_PROVIDER_SITE_OTHER): Payer: Self-pay

## 2021-11-18 DIAGNOSIS — E662 Morbid (severe) obesity with alveolar hypoventilation: Secondary | ICD-10-CM | POA: Diagnosis not present

## 2021-11-18 NOTE — Telephone Encounter (Signed)
Spoke with the patient and she has been rescheduled for her left brachial cephalic fistula with Dr. Delana Meyer on 12/09/21 at the [facility]. Pre-op phone call is on 11/30/21 between 8-1 pm. Pre-surgical instructions were discussed and will be mailed.

## 2021-11-22 DIAGNOSIS — J9601 Acute respiratory failure with hypoxia: Secondary | ICD-10-CM | POA: Diagnosis not present

## 2021-11-22 DIAGNOSIS — I5032 Chronic diastolic (congestive) heart failure: Secondary | ICD-10-CM | POA: Diagnosis not present

## 2021-11-22 DIAGNOSIS — G4733 Obstructive sleep apnea (adult) (pediatric): Secondary | ICD-10-CM | POA: Diagnosis not present

## 2021-11-22 DIAGNOSIS — I482 Chronic atrial fibrillation, unspecified: Secondary | ICD-10-CM | POA: Diagnosis not present

## 2021-11-23 ENCOUNTER — Ambulatory Visit
Admission: RE | Admit: 2021-11-23 | Discharge: 2021-11-23 | Disposition: A | Discharge status: Home / Self Care | Payer: BC Managed Care – PPO | Source: Ambulatory Visit | Attending: Oncology | Admitting: Oncology

## 2021-11-23 DIAGNOSIS — Z923 Personal history of irradiation: Secondary | ICD-10-CM | POA: Insufficient documentation

## 2021-11-23 DIAGNOSIS — Z170 Estrogen receptor positive status [ER+]: Secondary | ICD-10-CM | POA: Insufficient documentation

## 2021-11-23 DIAGNOSIS — Z1231 Encounter for screening mammogram for malignant neoplasm of breast: Secondary | ICD-10-CM | POA: Diagnosis not present

## 2021-11-23 DIAGNOSIS — C50911 Malignant neoplasm of unspecified site of right female breast: Secondary | ICD-10-CM | POA: Diagnosis not present

## 2021-11-23 DIAGNOSIS — Z992 Dependence on renal dialysis: Secondary | ICD-10-CM | POA: Diagnosis not present

## 2021-11-30 ENCOUNTER — Encounter
Admission: RE | Admit: 2021-11-30 | Discharge: 2021-11-30 | Disposition: A | Discharge status: Home / Self Care | Payer: BC Managed Care – PPO | Source: Ambulatory Visit | Attending: Vascular Surgery | Admitting: Vascular Surgery

## 2021-11-30 ENCOUNTER — Other Ambulatory Visit: Payer: Self-pay

## 2021-11-30 ENCOUNTER — Other Ambulatory Visit (INDEPENDENT_AMBULATORY_CARE_PROVIDER_SITE_OTHER): Payer: Self-pay | Admitting: Nurse Practitioner

## 2021-11-30 ENCOUNTER — Telehealth: Payer: Self-pay | Admitting: *Deleted

## 2021-11-30 VITALS — Ht 64.0 in | Wt 341.7 lb

## 2021-11-30 DIAGNOSIS — N186 End stage renal disease: Secondary | ICD-10-CM

## 2021-11-30 DIAGNOSIS — Z01812 Encounter for preprocedural laboratory examination: Secondary | ICD-10-CM

## 2021-11-30 NOTE — Telephone Encounter (Signed)
I s/w the pt and informed her of the reasoning why she is needing the appt in the office for pre op clearance. Pt thanked me for the call back and taking the time to explain to her why. Pt agreeable to plan of care 12/05/21 with Dr. Rockey Situ.

## 2021-11-30 NOTE — Patient Instructions (Addendum)
Your procedure is scheduled on: December 09, 2021 FRIDAY Report to the Registration Desk on the 1st floor of the [facility]. To find out your arrival time, please call [phone] between [place] on: December 08, 2021 THURSDAY If your arrival time is 6:00 am, do not arrive prior to that time as the [facility] entrance doors do not open until 6:00 am.  REMEMBER: Instructions that are not followed completely may result in serious medical risk, up to and including death; or upon the discretion of your surgeon and anesthesiologist your surgery may need to be rescheduled.  DO NOT EAT OR DRINK  midnight the night before surgery.  No gum chewing, lozengers or hard candies.  TAKE THESE MEDICATIONS THE MORNING OF SURGERY WITH A SIP OF WATER: AMLODIPINE  CLONIDINE (CATAPRES) LEVOTHYROXINE METOPROLOL (TOPROL XL)  Use inhalers on the day of surgery   STOP TRULICITY ONE WEEK BEFORE SURGERY. DO NOT TAKE ON December 05, 2021 MONDAY  HOLD ELIQUIS 3 DAYS BEFORE SURGERY. LAST DOSE OF ELIQUIS IS ON December 05, 2021. RESUME AS INSTRUCTED AFTER SURGERY.  One week prior to surgery: Stop Anti-inflammatories (NSAIDS) such as Advil, Aleve, Ibuprofen, Motrin, Naproxen, Naprosyn and Aspirin based products such as Excedrin, Goodys Powder, BC Powder. Stop ANY OVER THE COUNTER supplements until after surgery. You may however, continue to take Tylenol if needed for pain up until the day of surgery.  No Alcohol for 24 hours before or after surgery.  No Smoking including e-cigarettes for 24 hours prior to surgery.  No chewable tobacco products for at least 6 hours prior to surgery.  No nicotine patches on the day of surgery.  Do not use any "recreational" drugs for at least a week prior to your surgery.  Please be advised that the combination of cocaine and anesthesia may have negative outcomes, up to and including death. If you test positive for cocaine, your surgery will be cancelled.  On the morning of  surgery brush your teeth with toothpaste and water, you may rinse your mouth with mouthwash if you wish. Do not swallow any toothpaste or mouthwash.  Use CHG wipes as directed on instruction sheet.  Do not wear jewelry, make-up, hairpins, clips or nail polish.  Do not wear lotions, powders, or perfumes OR DEODORANT.  Do not shave body from the neck down 48 hours prior to surgery just in case you cut yourself which could leave a site for infection.  Also, freshly shaved skin may become irritated if using the CHG soap.  Contact lenses, hearing aids and dentures may not be worn into surgery.  Do not bring valuables to the hospital. [facility] is not responsible for any missing/lost belongings or valuables.   Bring your C-PAP to the hospital with you in case you may have to spend the night.   Notify your doctor if there is any change in your medical condition (cold, fever, infection).  Wear comfortable clothing (specific to your surgery type) to the hospital.  After surgery, you can help prevent lung complications by doing breathing exercises.  Take deep breaths and cough every 1-2 hours. Your doctor may order a device called an Incentive Spirometer to help you take deep breaths. When coughing or sneezing, hold a pillow firmly against your incision with both hands. This is called "splinting." Doing this helps protect your incision. It also decreases belly discomfort.  If you are being discharged the day of surgery, you will not be allowed to drive home. You will  need a responsible adult (18 years or older) to drive you home and stay with you that night.   If you are taking public transportation, you will need to have a responsible adult (18 years or older) with you. Please confirm with your physician that it is acceptable to use public transportation.   Please call the [facility] Dept. at [phone] if you have any questions about these instructions.  Surgery Visitation  Policy:  Patients undergoing a surgery or procedure may have two family members or support persons with them as long as the person is not COVID-19 positive or experiencing its symptoms.

## 2021-11-30 NOTE — Telephone Encounter (Signed)
   Name: Nicole Schmidt  DOB: 19-Nov-1965  MRN: [id number]  Primary Cardiologist: Kathlyn Sacramento, MD  Chart reviewed as part of pre-operative protocol coverage. Because of Julian Askin Hanley's past medical history and time since last visit, she will require a follow-up in-office visit in order to better assess preoperative cardiovascular risk.  Pre-op covering staff: - Please schedule appointment and call patient to inform them. If patient already had an upcoming appointment within acceptable timeframe, please add "pre-op clearance" to the appointment notes so provider is aware. - Please contact requesting surgeon's office via preferred method (i.e, phone, fax) to inform them of need for appointment prior to surgery.  This message will also be routed to pharmacy pool for input on holding Eliquis as requested below so that this information is available to the clearing provider at time of patient's appointment.   Lenna Sciara, NP  11/30/2021, 12:38 PM

## 2021-11-30 NOTE — Telephone Encounter (Signed)
-----   Message from Karen Kitchens, NP sent at 11/30/2021 11:59 AM EDT ----- Regarding: Request for pre-operative cardiac clearance Request for pre-operative cardiac clearance:  1. What type of surgery is being performed?  ARTERIOVENOUS (AV) FISTULA CREATION (BRACHIAL CEPHALIC)  2. When is this surgery scheduled?  12/09/2021  3. Type of clearance being requested (medical, pharmacy, both)? BOTH   4. Are there any medications that need to be held prior to surgery? APIXABAN  5. Practice name and name of physician performing surgery?  Performing surgeon: Dr. [name], MD Requesting clearance: Honor Loh, FNP-C    6. Anesthesia type (none, local, MAC, general)? GENERAL  7. What is the office phone and fax number?   Phone: [phone] Fax: [phone]  ATTENTION: Unable to create telephone message as per your standard workflow. Directed by [facility] providers to send requests for cardiac clearance to this pool for appropriate distribution to provider covering pre-operative clearances.   Honor Loh, MSN, APRN, FNP-C, CEN [facility]  Peri-operative Services Nurse Practitioner Phone: [phone] 11/30/21 11:59 AM

## 2021-11-30 NOTE — Telephone Encounter (Signed)
Patient with diagnosis of afib on Eliquis for anticoagulation.    Procedure: ARTERIOVENOUS (AV) FISTULA CREATION (BRACHIAL CEPHALIC) Date of procedure: 12/09/21   CHA2DS2-VASc Score = 5   This indicates a 7.2% annual risk of stroke. The patient's score is based upon: CHF History: 1 HTN History: 1 Diabetes History: 1 Stroke History: 0 Vascular Disease History: 1 Age Score: 0 Gender Score: 1      Patient has hx of a single PE in 2016  CrCl 10.5 ml/min  Per office protocol, patient can hold Eliquis for 3 days prior to procedure.    **This guidance is not considered finalized until pre-operative APP has relayed final recommendations.**

## 2021-11-30 NOTE — Telephone Encounter (Signed)
I will need to see if I can get some help from the [place] office to see where we can get the pt scheduled in time for her procedure.

## 2021-12-01 NOTE — Progress Notes (Signed)
  Perioperative Services Pre-Admission/Anesthesia Testing    Date: 12/01/21  Name: Nicole Schmidt MRN:   [id number]  Re: GLP-1 clearance and provider recommendations   Planned Surgical Procedure(s):    Case: [id number] Date/Time: 12/09/21 0715   Procedure: ARTERIOVENOUS (AV) FISTULA CREATION (BRACHIAL CEPHALIC) (Left)   Anesthesia type: General   Pre-op diagnosis: ESRD   Location: [facility] OR ROOM 08 / [facility]   Surgeons: Katha Cabal, MD   Clinical Notes:  Patient is scheduled for the above procedure on 12/09/2021 with Dr. [name], MD. In review of her medication reconciliation it was noted that patient is on a prescribed GLP-1 medication. Per guidelines issued by the American Society of Anesthesiologists (ASA), it is recommended that these medications be held for 7 days prior to the patient undergoing any type of elective surgical procedure. The patient is taking the following GLP-1 medication:  '[]'$  SEMAGLUTIDE   '[]'$  EXENATIDE  '[]'$  LIRAGLUTIDE   '[]'$  LIXISENATIDE  '[x]'$  DULAGLUTIDE     '[]'$  OTHER GLP-1 medication: _______________  Reached out to prescribing provider Ronnald Collum, MD) to make them aware of the guidelines from anesthesia. Given that this patient takes the prescribed GLP-1 medication for her  diabetes diagnosis, rather than for weight loss, recommendations from the prescribing provider were solicited. Prescribing provider made aware of the following so that informed decision/POC can be developed for this patient that may be taking medications belonging to these drug classes:  Oral GLP-1 medications will be held 1 day prior to surgery.  Injectable GLP-1 medications will be held 7 days prior to surgery.  Metformin is routinely held 48 hours prior to surgery due to renal concerns, potential need for contrasted imaging perioperatively, and the potential for tissue hypoxia leading to drug induced lactic acidosis.  All SGLT2i medications are held 72  hours prior to surgery as they can be associated with the increased potential for developing euglycemic diabetic ketoacidosis (EDKA).   Impression and Plan:  Nicole Schmidt is on a prescribed GLP-1 medication, which induces the known side effect of decreased gastric emptying. Efforts are bring made to mitigate the risk of perioperative hyperglycemic events, as elevated blood glucose levels have been found to contribute to intra/postoperative complications. Additionally, hyperglycemic extremes can potentially necessitate the postponing of a patient's elective case in order to better optimize perioperative glycemic control, again with the aforementioned guidelines in place. With this in mind, recommendations have been sought from the prescribing provider, who has cleared patient to proceed with holding the prescribed GLP-1 as per the guidelines from the ASA.   Provider recommending: no further recommendations received from the prescribing provider.  Copy of signed clearance and recommendations placed on patient's chart for inclusion in their medical record and for review by the surgical/anesthetic team on the day of her procedure.   Honor Loh, MSN, APRN, FNP-C, CEN [facility]  Peri-operative Services Nurse Practitioner Phone: [phone] 12/01/21 1:45 PM  NOTE: This note has been prepared using Dragon dictation software. Despite my best ability to proofread, there is always the potential that unintentional transcriptional errors may still occur from this process.

## 2021-12-04 NOTE — Progress Notes (Unsigned)
Cardiology Office Note  Date:  12/05/2021   ID:  Nicole Schmidt, DOB 1965-04-19, MRN [id number]  PCP:  Lenard Simmer, MD   Chief Complaint  Patient presents with   Cardiac clearance     Scheduled for AV fistula creation with Dr.Schneir December 09, 2021. Medications reviewed by the patient verbally. "Doing well."     HPI:  [name] is a 56 year old woman with past medical history of  breast cancer status post radiation,  chronic HFpEF,  permanent atrial fibrillation, diagnosed in September 2020, cardioversion October 2020 hypertension,  hyperlipidemia,  type 2 diabetes mellitus,  end-stage renal disease,  anemia,  obesity,  hypothyroidism, pulmonary embolism on chronic Eliquis following PE in September 2016.   Ejection fraction 55 to 60% April 2023 Patient of Dr. Fletcher Anon Who presents for preoperative evaluation  In follow-up today reports that she feels well ,no complaints Scheduled for AV fistula creation with Dr.Schneir December 09, 2021, Friday  No significant edema Dry weight 155 kg HD on Tuesday, Thursday,Saturday, Dr. Holley Raring Heart rate elevated on today's visit Atrial fibrillation rate 105 bpm  EKG personally reviewed by myself on todays visit Atrial fibrillation ventricular rate 105 bpm no significant ST-T wave changes  Other past cardiac history In December 2021, she was hospitalized with respiratory failure secondary to heart failure and pneumonia.   Echo showed an EF of 65 to 70% with severe LVH.    Medication intolerances Spironolactone was discontinued secondary to hyperkalemia.    July 2022,  back in atrial fibrillation and was minimally symptomatic.   decision was made to pursue a rate control strategy.   admission to [facility] in December 2022 secondary to heart failure and acute kidney injury.   Creatinine was 5.82 on admission.   required permacath placement and initiation of hemodialysis in January 2023.    readmission in early February 2023  due to heart failure and respiratory failure, and required dialysis with ultrafiltration.  She has since been on a trilogy device at home.    March 2023, she was seen in the emergency department secondary to hypoxia and altered mental status, after she took off her trilogy mask and failed to reapply it.  She was found by family member.  In the ED, troponin was 164.    Tuesday, Thursday, Saturday dialysis.   limited echo showed an EF of 55 to 60% without regional wall motion abnormalities.     left arm AV fistula placement.    emergency department June 13 and 14 due to constipation requiring enema.   CT of the abdomen pelvis showed no evidence of SBO.  She required disimpaction.    sometimes her blood pressure drops during dialysis for which she will use a midodrine as needed.     PMH:   has a past medical history of (HFpEF) heart failure with preserved ejection fraction ([name]), Acquired hypothyroidism (02/09/2020), Adenoma of left adrenal gland, Anemia, Aortic atherosclerosis ([name]), Arthritis, Breast cancer of upper-inner quadrant of right female breast ([name]), COPD (chronic obstructive pulmonary disease) ([name]), Diabetes mellitus type 2 in obese ([name]) (02/09/2020), Difficult intubation (09/28/2021), Diverticulosis, Dyslipidemia (02/09/2020), ESBL (extended spectrum beta-lactamase) producing bacteria infection (03/20/2021), ESRD (end stage renal disease) on dialysis [facility]), Essential hypertension, Gout, History of kidney stones, Long term current use of anticoagulant, Menopause, Morbid obesity ([place]), OSA treated with BiPAP, Permanent atrial fibrillation ([name]), Personal history of radiation therapy, Pulmonary embolism ([name]) (10/2014), Restrictive lung disease, Shingles (10/24/2021), Sleep difficulties, and Thyroid goiter.  PSH:  Past Surgical History:  Procedure Laterality Date   A/V FISTULAGRAM Left 09/02/2021   Procedure: A/V Fistulagram;  Surgeon: Katha Cabal, MD;  Location: [place]  [facility] LAB;  Service: Cardiovascular;  Laterality: Left;   AV FISTULA PLACEMENT Left 07/22/2021   Procedure: ARTERIOVENOUS (AV) FISTULA CREATION (RADIALCEPHALIC);  Surgeon: Katha Cabal, MD;  Location: [facility] ORS;  Service: Vascular;  Laterality: Left;   BREAST BIOPSY Right 2014   breast ca   BREAST EXCISIONAL BIOPSY Right 07/19/2012   breast ca   BREAST SURGERY Right 2014   with sentinel node bx subareaolar duct excision   CARDIOVERSION N/A 11/28/2018   Procedure: CARDIOVERSION;  Surgeon: Minna Merritts, MD;  Location: [facility] ORS;  Service: Cardiovascular;  Laterality: N/A;   COLONOSCOPY WITH PROPOFOL N/A 01/30/2017   Procedure: COLONOSCOPY WITH PROPOFOL;  Surgeon: Christene Lye, MD;  Location: [facility] ENDOSCOPY;  Service: Endoscopy;  Laterality: N/A;   CYSTOSCOPY W/ RETROGRADES  06/17/2021   Procedure: CYSTOSCOPY WITH RETROGRADE PYELOGRAM;  Surgeon: Billey Co, MD;  Location: [facility] ORS;  Service: Urology;;   CYSTOSCOPY W/ URETERAL STENT PLACEMENT Left 02/14/2021   Procedure: CYSTOSCOPY WITH RETROGRADE PYELOGRAM/URETERAL STENT PLACEMENT;  Surgeon: Billey Co, MD;  Location: [facility] ORS;  Service: Urology;  Laterality: Left;   CYSTOSCOPY/URETEROSCOPY/HOLMIUM LASER/STENT PLACEMENT Left 06/17/2021   Procedure: CYSTOSCOPY/URETEROSCOPY/HOLMIUM LASER/STENT EXCHANGE;  Surgeon: Billey Co, MD;  Location: [facility] ORS;  Service: Urology;  Laterality: Left;   DIALYSIS/PERMA CATHETER INSERTION N/A 02/24/2021   Procedure: DIALYSIS/PERMA CATHETER INSERTION;  Surgeon: Algernon Huxley, MD;  Location: [place] [facility];  Service: Cardiovascular;  Laterality: N/A;   PERIPHERAL VASCULAR THROMBECTOMY Left 09/28/2021   Procedure: PERIPHERAL VASCULAR THROMBECTOMY;  Surgeon: Katha Cabal, MD;  Location: [place] [facility];  Service: Cardiovascular;  Laterality: Left;    Current Outpatient Medications  Medication Sig Dispense Refill   acetaminophen (TYLENOL) 325 MG tablet Take 2 tablets (650 mg  total) by mouth every 6 (six) hours as needed for mild pain (or Fever >/= 101).     ADVAIR DISKUS 250-50 MCG/ACT AEPB Inhale 1 puff into the lungs 2 (two) times daily.     albuterol (VENTOLIN HFA) 108 (90 Base) MCG/ACT inhaler Inhale 2 puffs into the lungs every 6 (six) hours as needed for wheezing or shortness of breath. 8.5 g 0   amLODipine (NORVASC) 5 MG tablet Take 1 tablet (5 mg total) by mouth daily. 90 tablet 3   apixaban (ELIQUIS) 5 MG TABS tablet Take 1 tablet (5 mg total) by mouth 2 (two) times daily. 180 tablet 1   atorvastatin (LIPITOR) 40 MG tablet Take 40 mg by mouth daily.     cloNIDine (CATAPRES) 0.2 MG tablet Take 0.2 mg by mouth 2 (two) times daily.     diclofenac Sodium (VOLTAREN) 1 % GEL Apply 2 g topically 4 (four) times daily. 100 g 0   ezetimibe (ZETIA) 10 MG tablet Take 10 mg by mouth daily.     ferric gluconate 250 mg in sodium chloride 0.9 % 250 mL Inject 250 mg into the vein Every Tuesday,Thursday,and Saturday with dialysis.     gabapentin (NEURONTIN) 300 MG capsule Take 1 capsule (300 mg total) by mouth at bedtime. 30 capsule 0   HYDROcodone-acetaminophen (NORCO) 5-325 MG tablet Take 1-2 tablets by mouth every 6 (six) hours as needed. 25 tablet 0   levothyroxine (SYNTHROID) 112 MCG tablet Take 1 tablet (112 mcg total) by mouth daily before breakfast. 30 tablet 0  metoprolol succinate (TOPROL-XL) 25 MG 24 hr tablet Take 1 tablet (25 mg total) by mouth daily. Take with or immediately following a meal. 90 tablet 3   midodrine (PROAMATINE) 10 MG tablet Take 1 tablet (10 mg total) by mouth Every Tuesday,Thursday,and Saturday with dialysis. (Patient taking differently: Take 10 mg by mouth Every Tuesday,Thursday,and Saturday with dialysis. As needed) 30 tablet 0   sevelamer carbonate (RENVELA) 800 MG tablet Take 1 tablet (800 mg total) by mouth 3 (three) times daily with meals. 90 tablet 0   traMADol (ULTRAM) 50 MG tablet Take 1 tablet (50 mg total) by mouth every 12 (twelve)  hours as needed for moderate pain. 30 tablet 0   TRULICITY 1.5 JJ/0.0XF SOPN Inject 1.5 mg into the skin every Monday.     calcitRIOL (ROCALTROL) 0.5 MCG capsule Take 0.5 mcg by mouth daily. (Patient not taking: Reported on 12/05/2021)     calcium carbonate (CALCIUM 600) 600 MG TABS tablet Take 600 mg by mouth daily with breakfast. (Patient not taking: Reported on 12/05/2021)     CRANBERRY PO Take 1 capsule by mouth daily. (Patient not taking: Reported on 12/05/2021)     ergocalciferol (VITAMIN D2) 1.25 MG (50000 UT) capsule Take 1 capsule (50,000 Units total) by mouth once a week. (Patient not taking: Reported on 12/05/2021) 5 capsule 0   folic acid (FOLVITE) 1 MG tablet Take 1 tablet (1 mg total) by mouth daily. (Patient not taking: Reported on 12/05/2021) 30 tablet 0   Lancets (ONETOUCH DELICA PLUS [id number]) MISC  (Patient not taking: Reported on 12/05/2021)     melatonin 5 MG TABS Take 2 tablets (10 mg total) by mouth at bedtime as needed (sleep). (Patient not taking: Reported on 12/05/2021) 60 tablet 0   ONETOUCH ULTRA test strip  (Patient not taking: Reported on 12/05/2021)     senna (SENOKOT) 8.6 MG tablet Take 1 tablet by mouth as needed for constipation. (Patient not taking: Reported on 09/02/2021)     No current facility-administered medications for this visit.     Allergies:   Spironolactone   Social History:  The patient  reports that she has never smoked. She has never been exposed to tobacco smoke. She has never used smokeless tobacco. She reports that she does not drink alcohol and does not use drugs.   Family History:   family history includes Atrial fibrillation in her mother; Colon cancer (age of onset: 8) in her cousin; Diabetes in her father; Heart attack in her mother; Heart failure in her mother; Hypertension in her father and mother; Parkinson's disease in her father; Rheum arthritis in her mother.    Review of Systems: Review of Systems  Constitutional: Negative.    HENT: Negative.    Respiratory: Negative.    Cardiovascular:  Positive for leg swelling.  Gastrointestinal: Negative.   Musculoskeletal: Negative.   Neurological: Negative.   Psychiatric/Behavioral: Negative.    All other systems reviewed and are negative.    PHYSICAL EXAM: VS:  BP (!) 110/54 (BP Location: Right Wrist, Patient Position: Sitting, Cuff Size: Normal)   Pulse (!) 105   Ht '5\' 4"'$  (1.626 m)   Wt (!) 348 lb 6 oz (158 kg)   LMP 07/17/2012   SpO2 93%   BMI 59.80 kg/m  , BMI Body mass index is 59.8 kg/m. GEN: Well nourished, well developed, in no acute distress HEENT: normal Neck: no JVD, carotid bruits, or masses Cardiac: Irregularly irregular,  no murmurs, rubs, or gallops,no edema  Respiratory:  clear to auscultation bilaterally, normal work of breathing GI: soft, nontender, nondistended, + BS MS: no deformity or atrophy Skin: warm and dry, no rash Neuro:  Strength and sensation are intact Psych: euthymic mood, full affect   Recent Labs: 03/26/2021: TSH 0.856 03/27/2021: Magnesium 2.1 06/06/2021: B Natriuretic Peptide 196.5 07/26/2021: ALT 6 10/24/2021: BUN 69; Creatinine, Ser 8.67; Hemoglobin 11.9; Platelets 179; Potassium 5.0; Sodium 134    Lipid Panel Lab Results  Component Value Date   CHOL 122 02/09/2020   HDL 49 02/09/2020   LDLCALC 42 02/09/2020   TRIG 153 (H) 02/09/2020      Wt Readings from Last 3 Encounters:  12/05/21 (!) 348 lb 6 oz (158 kg)  11/30/21 (!) 341 lb 11.4 oz (155 kg)  11/14/21 (!) 348 lb (157.9 kg)       ASSESSMENT AND PLAN:  Problem List Items Addressed This Visit       Cardiology Problems   Atrial fibrillation, chronic (HCC) - Primary (Chronic)   Relevant Orders   EKG 12-Lead   Pulmonary emboli (HCC)     Other   Diabetes mellitus type 2 in obese (HCC) (Chronic)   Chronic respiratory failure with hypoxia (HCC)   Relevant Orders   EKG 12-Lead   ESRD (end stage renal disease) ([place])   Relevant Orders   EKG 12-Lead    Other Visit Diagnoses     Chronic heart failure with preserved ejection fraction (HFpEF) ([place])       Relevant Orders   EKG 12-Lead   Mixed hyperlipidemia          Preop cardiovascular evaluation Acceptable risk for AV fistula surgery with Dr. Delana Meyer October 27 For the rate control, we recommended she take extra metoprolol succinate on October 26 in the p.m., take her regular morning dose on October 27 Hold amlodipine October 27 in the morning She is scheduled to hold anticoagulation starting tomorrow October 24 in preparation for procedure  Permanent atrial fibrillation Rate mildly elevated on today's visit Plan as above Recommended she monitor her heart rate at home If rate continues to run fast, may need to increase metoprolol succinate up to 25 twice daily and hold amlodipine  Morbid obesity We have encouraged continued exercise, careful diet management in an effort to lose weight.  Diabetes type 2 with complications Followed by Dr. Francoise Schaumann No A1c on file   Total encounter time more than 30 minutes  Greater than 50% was spent in counseling and coordination of care with the patient    Signed, Esmond Plants, M.D., Ph.D. [place], [place]

## 2021-12-05 ENCOUNTER — Encounter: Payer: Self-pay | Admitting: Vascular Surgery

## 2021-12-05 ENCOUNTER — Encounter: Payer: Self-pay | Admitting: Cardiovascular Disease

## 2021-12-05 ENCOUNTER — Ambulatory Visit: Payer: BC Managed Care – PPO | Attending: Cardiovascular Disease | Admitting: Cardiovascular Disease

## 2021-12-05 VITALS — BP 110/54 | HR 105 | Ht 64.0 in | Wt 348.4 lb

## 2021-12-05 DIAGNOSIS — E782 Mixed hyperlipidemia: Secondary | ICD-10-CM | POA: Diagnosis not present

## 2021-12-05 DIAGNOSIS — I482 Chronic atrial fibrillation, unspecified: Secondary | ICD-10-CM | POA: Diagnosis not present

## 2021-12-05 DIAGNOSIS — E669 Obesity, unspecified: Secondary | ICD-10-CM

## 2021-12-05 DIAGNOSIS — J9611 Chronic respiratory failure with hypoxia: Secondary | ICD-10-CM

## 2021-12-05 DIAGNOSIS — N186 End stage renal disease: Secondary | ICD-10-CM

## 2021-12-05 DIAGNOSIS — E1169 Type 2 diabetes mellitus with other specified complication: Secondary | ICD-10-CM

## 2021-12-05 DIAGNOSIS — I2782 Chronic pulmonary embolism: Secondary | ICD-10-CM

## 2021-12-05 DIAGNOSIS — I5032 Chronic diastolic (congestive) heart failure: Secondary | ICD-10-CM

## 2021-12-05 NOTE — Patient Instructions (Addendum)
Medication Instructions:  Please take extra metoprolol succinate night before surgery on Thursday 10/26, also the morning of surgery 10/27  For low pressure, take midodrine Friday morning (<100/50)  Hold the amlodipine the morning of surgery  If you need a refill on your cardiac medications before your next appointment, please call your pharmacy.   Lab work: No new labs needed  Testing/Procedures: No new testing needed  Follow-Up: At [facility], you and your health needs are our priority.  As part of our continuing mission to provide you with exceptional heart care, we have created designated Provider Care Teams.  These Care Teams include your primary Cardiologist (physician) and Advanced Practice Providers (APPs -  Physician Assistants and Nurse Practitioners) who all work together to provide you with the care you need, when you need it.  Keep your scheduled follow up appointment in Dec with Murray Hodgkins, NP 01/13/22 @ 1:55 pm    COVID-19 Vaccine Information can be found at: [email or web address] For questions related to vaccine distribution or appointments, please email [email or web address] or call [phone].

## 2021-12-05 NOTE — Progress Notes (Signed)
Perioperative Services  Pre-Admission/Anesthesia Testing Clinical Review  Date: 12/05/21  Patient Demographics:  Name: Nicole Schmidt DOB:   13-Jul-1965 MRN:   [id number]  Planned Surgical Procedure(s):    Case: [id number] Date/Time: 12/09/21 0715   Procedure: ARTERIOVENOUS (AV) FISTULA CREATION (BRACHIAL CEPHALIC) (Left)   Anesthesia type: General   Pre-op diagnosis: ESRD   Location: [facility] OR ROOM 08 / [place] ORS FOR ANESTHESIA GROUP   Surgeons: Katha Cabal, MD   NOTE: Available PAT nursing documentation and vital signs have been reviewed. Clinical nursing staff has updated patient's PMH/PSHx, current medication list, and drug allergies/intolerances to ensure comprehensive history available to assist in medical decision making as it pertains to the aforementioned surgical procedure and anticipated anesthetic course. Extensive review of available clinical information performed. Nicole Schmidt PMH and PSHx updated with any diagnoses/procedures that  may have been inadvertently omitted during her intake with the pre-admission testing department's nursing staff.  Clinical Discussion:  Nicole Schmidt is a 56 y.o. female who is submitted for pre-surgical anesthesia review and clearance prior to her undergoing the above procedure. Patient has never been a smoker. Pertinent PMH includes: atrial fibrillation, HFpEF, aortic atherosclerosis, PE, HTN, HLD, T2DM, hypothyroidism, ESRD on dialysis, COPD, OSAH (requires nocturnal PAP therapy), anemia, OA, sleep difficulties.   Patient is followed by cardiology Fletcher Anon, MD). She was last seen in the cardiology clinic on 12/05/2021; notes reviewed. Patient admitted in 01/2021 with heart failure and AKI.  She was started on hemodialysis and 02/2021.  Patient readmitted in 03/2021 with altered mental status secondary to nocturnal hypoxia related to her underlying OSA H diagnoses and improper use of her trilogy ventilator mask.  Admitting troponin at that time was  164 ng/mL.  Patient was stabilized and discharged home with plans for outpatient follow-up.  Since her admission to the hospital, patient has been doing well overall from a cardiovascular perspective.  She denied any episodes of recurrent chest pain, increased shortness of breath, PND, orthopnea, palpitations, significant peripheral edema, vertiginous symptoms, or presyncope/syncope.  Patient with a past medical history significant for cardiovascular diagnoses.  Patient underwent DCCV procedure in 11/2018 for her atrial fibrillation.  Procedure restored NSR, however since that time, patient has developed refractory atrial arrhythmia.  Last TTE was performed on 05/18/2021 revealing a normal left ventricular systolic function with mild LVH; LVEF 55-60%.  RVSF normal.  There is no evidence of significant valvular regurgitation or transvalvular gradient suggestive of stenosis.     Again, patient has an atrial fibrillation diagnosis; CHA2DS2-VASc Score = 7 (sex, HFpEF, HTN, PE x 2, aortic plaque, T2DM).  Rate and rhythm currently maintained on oral metoprolol succinate.  She is chronically anticoagulated using standard dose apixaban; compliant with therapy with no evidence or reports of GI bleeding. Blood pressure well reasonably controlled at 110/54 mmHg on currently prescribed CCB (amlodipine), alpha blocker (clonidine) and beta-blocker (metoprolol tartrate) therapies.  Patient does have some episodes of post dialysis hypotension for which she uses midodrine as needed. Patient is on atorvastatin + ezetimibe or her HLD diagnosis and further ASCVD prevention. T2DM reasonably controlled on currently prescribed regimen; last HgbA1c was 7.2% when checked on 02/09/2021.  Patient has an OSAH diagnosis for which she uses nocturnal BiPAP therapy; patient reasonably compliant with prescribed nocturnal PAP therapy and has noticed improvement in quality of life. Patient continued on hemodialysis with a Tuesday, Thursday,  Saturday schedule. Functional capacity limited by obesity and multiple medical comorbidities.  Patient not felt to be able to achieve  4 METS of physical activity without experiencing angina/anginal equivalent symptoms.  No changes were made to her medication regimen.  Patient to follow-up with outpatient cardiology as already scheduled on 01/13/2022, or sooner if needed.  Nicole Schmidt is scheduled for LEFT ARTERIOVENOUS (AV) FISTULA CREATION (BRACHIAL CEPHALIC) on 42/35/3614 with Dr. [name], MD. Given patient's past medical history significant for cardiovascular diagnoses, presurgical cardiac clearance was sought by the PAT team. Per cardiology, "patient is at an overall ACCEPTABLE risk for the AV fistula surgery with Dr. Delana Meyer.  For rate control, would recommend she take an extra dose of metoprolol succinate on the evening prior to her surgery, followed by her regular morning dose on the day of her procedure.  Patient should hold her amlodipine on the morning of 12/09/2020". Again, this patient is on daily anticoagulation therapy.  She has been instructed on recommendations for holding her apixaban for 3 days prior to her procedure with plans to restart since postoperative bleeding risk felt to be minimized by her primary attending surgeon.  The patient is aware that her last dose of apixaban should be on 12/05/2021.  Patient denies previous perioperative complications with anesthesia in the past. In review of the available records, it is noted that patient underwent a general anesthetic course here at [facility] ([facility] IV) in 09/2021. Anesthesia noted that patient was difficult to intubate (mallampati III-IV; BMI > 55 kg/m) during this procedure.      11/30/2021    9:23 AM 11/14/2021    1:32 PM 10/24/2021    2:26 PM  Vitals with BMI  Height '5\' 4"'$  '5\' 4"'$    Weight 341 lbs 11 oz 348 lbs   BMI 43.15 40.0   Systolic  867 93  Diastolic  73 47  Pulse  86 100     Providers/Specialists:   NOTE: Primary physician provider listed below. Patient may have been seen by APP or partner within same practice.   PROVIDER ROLE / SPECIALTY LAST OV  Schnier, Dolores Lory, MD Vascular Surgery (Surgeon) 09/20/2021   Lenard Simmer, MD Primary Care Provider ???  Kathlyn Sacramento, MD Cardiology 12/05/2021  Chesley Mires, MD Pulmonary Medicine 08/31/2021  Randa Evens, MD Hematology 11/14/2021   Allergies:  Spironolactone  Current Home Medications:   No current facility-administered medications for this encounter.    acetaminophen (TYLENOL) 325 MG tablet   ADVAIR DISKUS 250-50 MCG/ACT AEPB   albuterol (VENTOLIN HFA) 108 (90 Base) MCG/ACT inhaler   amLODipine (NORVASC) 5 MG tablet   apixaban (ELIQUIS) 5 MG TABS tablet   atorvastatin (LIPITOR) 20 MG tablet   atorvastatin (LIPITOR) 40 MG tablet   calcitRIOL (ROCALTROL) 0.5 MCG capsule   calcium carbonate (CALCIUM 600) 600 MG TABS tablet   cloNIDine (CATAPRES) 0.2 MG tablet   CRANBERRY PO   diclofenac Sodium (VOLTAREN) 1 % GEL   ergocalciferol (VITAMIN D2) 1.25 MG (50000 UT) capsule   ezetimibe (ZETIA) 10 MG tablet   ferric gluconate 250 mg in sodium chloride 0.9 % 619 mL   folic acid (FOLVITE) 1 MG tablet   gabapentin (NEURONTIN) 300 MG capsule   HYDROcodone-acetaminophen (NORCO) 5-325 MG tablet   Lancets (ONETOUCH DELICA PLUS [id number]) MISC   levothyroxine (SYNTHROID) 112 MCG tablet   melatonin 5 MG TABS   metoprolol succinate (TOPROL-XL) 25 MG 24 hr tablet   midodrine (PROAMATINE) 10 MG tablet   ONETOUCH ULTRA test strip   senna (SENOKOT) 8.6 MG tablet   sevelamer carbonate (RENVELA) 800  MG tablet   traMADol (ULTRAM) 50 MG tablet   TRULICITY 1.5 QA/0.6OR SOPN   History:/   Past Medical History:  Diagnosis Date   (HFpEF) heart failure with preserved ejection fraction ([place])    a. 10/2018 Echo: EF 60-65%, diast dysfxn, RVSP 50.7 mmHg; b. 01/2020 Echo: EF 65-70%, G2DD; c. 01/2021 Echo: EF  60-65%, no rwma, mod LVH. Nl RV fxn, mildly enlarged RV. Mod BAE. Triv MR. d. 05/2021 Echo: EF 55-60; no RWMAs, mild LVH, mild TR.   Acquired hypothyroidism 02/09/2020   Adenoma of left adrenal gland    Anemia    Aortic atherosclerosis (HCC)    Arthritis    Breast cancer of upper-inner quadrant of right female breast ([place])    Right breast invasive CA and DCIS , 7 mm T1,N0,M0. Er/PR pos, her 2 negative.  Margins 1 mm.   COPD (chronic obstructive pulmonary disease) (HCC)    Diabetes mellitus type 2 in obese ([place]) 02/09/2020   Difficult intubation 09/28/2021   a.) mallampati III-IV; BMI >55 kg/m   Diverticulosis    Dyslipidemia 02/09/2020   ESBL (extended spectrum beta-lactamase) producing bacteria infection 03/20/2021   a. urine culture (+) for ESBL producing E.coli and P. mirabilis   ESRD (end stage renal disease) on dialysis ([place])    a. T-Th-Sat HD initiated 02/2021   Essential hypertension    Gout    History of kidney stones    Long term current use of anticoagulant    a.) apixaban   Menopause    age 46   Morbid obesity ([place])    OSA treated with BiPAP    a. on nocturnal PAP therapy; Trilogy 100 ventilator   Permanent atrial fibrillation ([place])    a. Dx 10/2018 in setting of PNA. b. CHA2DS2-VASc = 7 (sex, HFpEF, HTN, PE x2, aortic plaque, T2DM); c. s/p successful DCCV 11/2018. d. subsequent recurrent Afib; rate/rhythm maintained on oral metoprolol succinate; chronically anticoagulated using standard dose apixaban.   Personal history of radiation therapy    Pulmonary embolism ([place]) 10/2014   a. on chronic apixaban   Restrictive lung disease    Shingles 10/24/2021   Sleep difficulties    a.) takes melatonin   Thyroid goiter    Past Surgical History:  Procedure Laterality Date   A/V FISTULAGRAM Left 09/02/2021   Procedure: A/V Fistulagram;  Surgeon: Katha Cabal, MD;  Location: [place] [facility];  Service: Cardiovascular;  Laterality: Left;   AV FISTULA PLACEMENT Left  07/22/2021   Procedure: ARTERIOVENOUS (AV) FISTULA CREATION (RADIALCEPHALIC);  Surgeon: Katha Cabal, MD;  Location: [facility] ORS;  Service: Vascular;  Laterality: Left;   BREAST BIOPSY Right 2014   breast ca   BREAST EXCISIONAL BIOPSY Right 07/19/2012   breast ca   BREAST SURGERY Right 2014   with sentinel node bx subareaolar duct excision   CARDIOVERSION N/A 11/28/2018   Procedure: CARDIOVERSION;  Surgeon: Minna Merritts, MD;  Location: [facility] ORS;  Service: Cardiovascular;  Laterality: N/A;   COLONOSCOPY WITH PROPOFOL N/A 01/30/2017   Procedure: COLONOSCOPY WITH PROPOFOL;  Surgeon: Christene Lye, MD;  Location: [facility] ENDOSCOPY;  Service: Endoscopy;  Laterality: N/A;   CYSTOSCOPY W/ RETROGRADES  06/17/2021   Procedure: CYSTOSCOPY WITH RETROGRADE PYELOGRAM;  Surgeon: Billey Co, MD;  Location: [facility] ORS;  Service: Urology;;   CYSTOSCOPY W/ URETERAL STENT PLACEMENT Left 02/14/2021   Procedure: CYSTOSCOPY WITH RETROGRADE PYELOGRAM/URETERAL STENT PLACEMENT;  Surgeon: Billey Co, MD;  Location: [facility] ORS;  Service: Urology;  Laterality: Left;   CYSTOSCOPY/URETEROSCOPY/HOLMIUM LASER/STENT PLACEMENT Left 06/17/2021   Procedure: CYSTOSCOPY/URETEROSCOPY/HOLMIUM LASER/STENT EXCHANGE;  Surgeon: Billey Co, MD;  Location: [facility] ORS;  Service: Urology;  Laterality: Left;   DIALYSIS/PERMA CATHETER INSERTION N/A 02/24/2021   Procedure: DIALYSIS/PERMA CATHETER INSERTION;  Surgeon: Algernon Huxley, MD;  Location: [place] [facility];  Service: Cardiovascular;  Laterality: N/A;   PERIPHERAL VASCULAR THROMBECTOMY Left 09/28/2021   Procedure: PERIPHERAL VASCULAR THROMBECTOMY;  Surgeon: Katha Cabal, MD;  Location: [place] [facility];  Service: Cardiovascular;  Laterality: Left;   Family History  Problem Relation Age of Onset   Diabetes Father    Hypertension Father    Parkinson's disease Father    Hypertension Mother    Rheum arthritis Mother    Atrial fibrillation Mother    Heart  failure Mother    Heart attack Mother        died in her mid-70's.   Colon cancer Cousin 71   Breast cancer Neg Hx    Social History   Tobacco Use   Smoking status: Never    Passive exposure: Never   Smokeless tobacco: Never  Vaping Use   Vaping Use: Never used  Substance Use Topics   Alcohol use: No    Alcohol/week: 0.0 standard drinks of alcohol   Drug use: No    Pertinent Clinical Results:  LABS: Labs reviewed: Acceptable for surgery.  Lab Results  Component Value Date   WBC 10.1 10/24/2021   HGB 11.9 (L) 10/24/2021   HCT 37.2 10/24/2021   MCV 106.9 (H) 10/24/2021   PLT 179 10/24/2021   Lab Results  Component Value Date   NA 134 (L) 10/24/2021   K 5.0 10/24/2021   CO2 24 10/24/2021   GLUCOSE 242 (H) 10/24/2021   BUN 69 (H) 10/24/2021   CREATININE 8.67 (H) 10/24/2021   CALCIUM 9.3 10/24/2021   GFRNONAA 5 (L) 10/24/2021    ECG: Date: 08/03/2021 Time ECG obtained: 1352 PM Rate: 77 bpm Rhythm: atrial fibrillation Axis (leads I and aVF): Normal Intervals: QRS 80 ms. QTc 423 ms. ST segment and T wave changes: No evidence of acute ST segment elevation or depression Comparison: Similar to previous tracing obtained on 06/20/2021   IMAGING / PROCEDURES: PULMONARY FUNCTION TESTING performed on 08/12/2021    CT ABDOMEN PELVIS WO CONTRAST performed on 07/27/2021 No acute intra-abdominal or pelvic pathology.  No bowel obstruction.  Normal appendix. Colonic diverticulosis. Left adrenal adenoma. Aortic atherosclerosis   TRANSTHORACIC ECHOCARDIOGRAM performed on 05/18/2021 Left ventricular ejection fraction, by estimation, is 55 to 60%. The left ventricle has normal function. The left ventricle has no regional wall motion abnormalities. There is mild left ventricular hypertrophy.  Right ventricular systolic function is normal.  The mitral valve is normal in structure. No evidence of mitral valve regurgitation.  The aortic valve is tricuspid. No aortic stenosis  is present.   Impression and Plan:  Nicole Schmidt has been referred for pre-anesthesia review and clearance prior to her undergoing the planned anesthetic and procedural courses. Available labs, pertinent testing, and imaging results were personally reviewed by me. This patient has been appropriately cleared by cardiology with an overall ACCEPTABLE risk of significant perioperative cardiovascular complications.  Based on clinical review performed today (12/05/21), barring any significant acute changes in the patient's overall condition, it is anticipated that she will be able to proceed with the planned surgical intervention. Any acute changes in clinical condition may necessitate her procedure being postponed and/or cancelled. Patient will  meet with anesthesia team (MD and/or CRNA) on the day of her procedure for preoperative evaluation/assessment. Questions regarding anesthetic course will be fielded at that time.   Pre-surgical instructions were reviewed with the patient during her PAT appointment and questions were fielded by PAT clinical staff. Patient was advised that if any questions or concerns arise prior to her procedure then she should return a call to PAT and/or her surgeon's office to discuss.  Honor Loh, MSN, APRN, FNP-C, CEN [facility]  Peri-operative Services Nurse Practitioner Phone: [phone] Fax: [phone] 12/05/21 12:09 PM  NOTE: This note has been prepared using Dragon dictation software. Despite my best ability to proofread, there is always the potential that unintentional transcriptional errors may still occur from this process.

## 2021-12-06 ENCOUNTER — Encounter: Payer: Self-pay | Admitting: Vascular Surgery

## 2021-12-06 NOTE — Progress Notes (Signed)
Pt contacted by PAT staff to go over new pre-op instructions from cardiologist for upcoming surgery. Pt stated she was aware of changes. She was able to tell PAT nurse new instructions. She said she was to take extra dose of metoprolol succinate Oct 26 pm. She was to take regular dose of metoprolol morning of Oct 27 and hold Amlodipine that morning. She is holding her Eliquis until after surgery.

## 2021-12-07 ENCOUNTER — Ambulatory Visit (INDEPENDENT_AMBULATORY_CARE_PROVIDER_SITE_OTHER): Payer: BC Managed Care – PPO | Admitting: Primary Care

## 2021-12-07 ENCOUNTER — Encounter: Payer: Self-pay | Admitting: Primary Care

## 2021-12-07 DIAGNOSIS — J9621 Acute and chronic respiratory failure with hypoxia: Secondary | ICD-10-CM

## 2021-12-07 DIAGNOSIS — N186 End stage renal disease: Secondary | ICD-10-CM | POA: Diagnosis not present

## 2021-12-07 DIAGNOSIS — G4733 Obstructive sleep apnea (adult) (pediatric): Secondary | ICD-10-CM | POA: Diagnosis not present

## 2021-12-07 DIAGNOSIS — N184 Chronic kidney disease, stage 4 (severe): Secondary | ICD-10-CM

## 2021-12-07 DIAGNOSIS — Z992 Dependence on renal dialysis: Secondary | ICD-10-CM

## 2021-12-07 DIAGNOSIS — N170 Acute kidney failure with tubular necrosis: Secondary | ICD-10-CM

## 2021-12-07 NOTE — Progress Notes (Addendum)
$'@Patient'y$  ID: Nicole Schmidt, female    DOB: Jan 04, 1966, 56 y.o.   MRN: [id number]  Chief Complaint  Patient presents with   Follow-up    Wearing trilogy 5-6hr nightly. No concerns.     Referring provider: Lenard Simmer, MD  HPI: 56 year old female, never smoked.  Past medical history significant for OSA/OHS, chronic respiratory failure, hx PE.  Patient of Dr. Halford Chessman.   Recent hospital/ED course:  Patient was admitted on 03/28/2021 until 04/15/2021 for debility related to chronic respiratory failure superimposed with diastolic congestive heart failure.  He was previously admitted back in December for management of acute on chronic diastolic heart failure and end-stage renal disease on hemodialysis with permacath placed on 02/25/2021.  Patient presented to [facility] on 03/17/2021 with inability to ambulate and lower extremity swelling.  She became more somnolent unable to follow commands.  ABG showed pH 7.14/PCO2 89/bicarb 30.3 and was placed on BiPAP.  Chest x-ray showed bilateral pulmonary opacities that were nonspecific.  Follow-up chest x-ray on 03/22/2021 showed stable mild pulm edema.  Urine culture was positive for ESBL protease E. coli treated empirically with 1 dose fosfomycin.  Patient was admitted to rehab on 03/28/2021 for inpatient therapies.  She was reevaluated in the emergency room on 04/24/2021 for ACUTE hypoxia/NSTEMI.  Patient family member found her in her wheelchair sleep without trilogy ventilator.  She was confused upon waking.  Family called EMS.  EKG showed A-fib, no ST elevation or T wave inversions.  BG showed elevated CO2 but only slightly, pH was normal.  Patient did not require BiPAP.  Troponins uptrending.  Felt to be demand from acute respiratory failure.  Heparin drip was not started as patient was already on Eliquis and not actively having chest pain. Discharged.   06/06/2021 Patient presents today for hospital follow-up. She is doing well. No acute respiratory symptoms. Her  breathing is good. She is compliant with Advair twice daily. She has not needed to use her Albuterol rescue inhaler in several weeks. She came in with her oxygen but her tanks were empty. She is 95% on RA. Ambulates with walker. Using Trilogy every night and while napping. Finds it much easier to use than BIPAP. She sleeps in a recliner currently. There has only been one time where she took her Trilogy mask off that she is aware of and she was admitted to the ED for 26 hours for possible NSTEMI. She continues dialysis tues, thurs, sat. She thinks they took off 3.5L last dialysis round. She is following fluid restriction.   08/31/2021  Patient presents today for 6-8 week follow-up. She is compliant with Trilogy NIV at night and with naps. She prefers Trilogy NIV to CPAP. She does not have to fight wearing it. Sleeping well at night. Not waking up as frequently. She takes machine with her to dialysis. Breathing is stable. Wearing 2L oxygen as needed with exertion and at night. She has not needed oxygen during the day time recently. She brings rescue inhaler with her everywhere but has not needed to use it. She is doing more in the last week. New cane. Continues Advair 250-54mg one puffs twice daily and prn albuterol. She will forget to use Advair at times. PFTs in June 2023 showed moderate restrictive lung disease, recommend weight loss. She has been approved for disability. Continues dialysis.    12/07/2021 - Interim hx  Patient presents today for regular overview.  She is doing well. No respiratory issues. Tolerating NIV. Wears 2L oxygen  at night with Trilogy. She gets winded with exertion d/t afib. She has not needed to use rescue inhaler.  She continues to get dialysis on Tues, Thursday and Saturday. Planning for fistula placement above the elbow this Friday. She saw cardiology on 12/05/21, they adjusted her medications.   Airview 04/3021-05/14/21 Usage 27/30 days; 90% > 4 hours Average usage 8 hours 21  mins Min EPAP 4; Max EPAP 15cm h20 Min PS 6; Max PS 22 Airleaks median 6.8 (95%- 48L/min) AHI 0.6   Airview download Sept 25-Oct 24 Usage 26/30 days Average usage 19 hours 35 mins Min EPAP 4; Max EPAP 15cm h20 Min PS 6; Max PS 22 Airleaks median 0.8L/min  AHI 0.6   Pulmonary function testing: 08/12/21>> FVC 1.89(55%), FEV 1.58 (60%), ratio 84, TLC 85%, DLCO 20.47 (78%)   Allergies  Allergen Reactions   Spironolactone     Hyperkalemia  Pt unaware of this allergy    Immunization History  Administered Date(s) Administered   Influenza-Unspecified 11/05/2018, 12/03/2021   PFIZER(Purple Top)SARS-COV-2 Vaccination 05/19/2019, 06/16/2019, 02/18/2020   Pfizer Covid-19 Vaccine Bivalent Booster 63yr & up 11/22/2020   Pneumococcal Polysaccharide-23 02/16/2020    Past Medical History:  Diagnosis Date   (HFpEF) heart failure with preserved ejection fraction ([facility]    a. 10/2018 Echo: EF 60-65%, diast dysfxn, RVSP 50.7 mmHg; b. 01/2020 Echo: EF 65-70%, G2DD; c. 01/2021 Echo: EF 60-65%, no rwma, mod LVH. Nl RV fxn, mildly enlarged RV. Mod BAE. Triv MR. d. 05/2021 Echo: EF 55-60; no RWMAs, mild LVH, mild TR.   Acquired hypothyroidism 02/09/2020   Adenoma of left adrenal gland    Anemia    Aortic atherosclerosis (HCC)    Arthritis    Breast cancer of upper-inner quadrant of right female breast ([facility]    Right breast invasive CA and DCIS , 7 mm T1,N0,M0. Er/PR pos, her 2 negative.  Margins 1 mm.   COPD (chronic obstructive pulmonary disease) (HCC)    Diabetes mellitus type 2 in obese ([facility] 02/09/2020   Difficult intubation 09/28/2021   a.) mallampati III-IV; BMI >55 kg/m   Diverticulosis    Dyslipidemia 02/09/2020   ESBL (extended spectrum beta-lactamase) producing bacteria infection 03/20/2021   a. urine culture (+) for ESBL producing E.coli and P. mirabilis   ESRD (end stage renal disease) on dialysis ([facility]    a. T-Th-Sat HD initiated 02/2021   Essential hypertension    Gout     History of kidney stones    Long term current use of anticoagulant    a.) apixaban   Menopause    age 56  Morbid obesity ([facility]    OSA treated with BiPAP    a. on nocturnal PAP therapy; Trilogy 100 ventilator   Permanent atrial fibrillation ([facility]    a. Dx 10/2018 in setting of PNA. b. CHA2DS2-VASc = 7 (sex, HFpEF, HTN, PE x2, aortic plaque, T2DM); c. s/p successful DCCV 11/2018. d. subsequent recurrent Afib; rate/rhythm maintained on oral metoprolol succinate; chronically anticoagulated using standard dose apixaban.   Personal history of radiation therapy    Pulmonary embolism ([facility] 10/2014   a. on chronic apixaban   Restrictive lung disease    Shingles 10/24/2021   Sleep difficulties    a.) takes melatonin   Thyroid goiter     Tobacco History: Social History   Tobacco Use  Smoking Status Never   Passive exposure: Never  Smokeless Tobacco Never   Counseling given: Not Answered   Outpatient Medications Prior to Visit  Medication Sig Dispense Refill   acetaminophen (TYLENOL) 325 MG tablet Take 2 tablets (650 mg total) by mouth every 6 (six) hours as needed for mild pain (or Fever >/= 101). (Patient not taking: Reported on 12/09/2021)     ADVAIR DISKUS 250-50 MCG/ACT AEPB Inhale 1 puff into the lungs 2 (two) times daily. (Patient not taking: Reported on 12/09/2021)     albuterol (VENTOLIN HFA) 108 (90 Base) MCG/ACT inhaler Inhale 2 puffs into the lungs every 6 (six) hours as needed for wheezing or shortness of breath. (Patient not taking: Reported on 12/09/2021) 8.5 g 0   amLODipine (NORVASC) 5 MG tablet Take 1 tablet (5 mg total) by mouth daily. 90 tablet 3   apixaban (ELIQUIS) 5 MG TABS tablet Take 1 tablet (5 mg total) by mouth 2 (two) times daily. 180 tablet 1   atorvastatin (LIPITOR) 40 MG tablet Take 40 mg by mouth daily.     calcitRIOL (ROCALTROL) 0.5 MCG capsule Take 0.5 mcg by mouth daily.     calcium carbonate (CALCIUM 600) 600 MG TABS tablet Take 600 mg by mouth daily  with breakfast.     cloNIDine (CATAPRES) 0.2 MG tablet Take 0.2 mg by mouth 2 (two) times daily.     CRANBERRY PO Take 1 capsule by mouth daily.     diclofenac Sodium (VOLTAREN) 1 % GEL Apply 2 g topically 4 (four) times daily. (Patient not taking: Reported on 12/09/2021) 100 g 0   ergocalciferol (VITAMIN D2) 1.25 MG (50000 UT) capsule Take 1 capsule (50,000 Units total) by mouth once a week. 5 capsule 0   ezetimibe (ZETIA) 10 MG tablet Take 10 mg by mouth daily.     ferric gluconate 250 mg in sodium chloride 0.9 % 250 mL Inject 250 mg into the vein Every Tuesday,Thursday,and Saturday with dialysis.     folic acid (FOLVITE) 1 MG tablet Take 1 tablet (1 mg total) by mouth daily. 30 tablet 0   gabapentin (NEURONTIN) 300 MG capsule Take 1 capsule (300 mg total) by mouth at bedtime. 30 capsule 0   HYDROcodone-acetaminophen (NORCO) 5-325 MG tablet Take 1-2 tablets by mouth every 6 (six) hours as needed. (Patient not taking: Reported on 12/09/2021) 25 tablet 0   Lancets (ONETOUCH DELICA PLUS [id number]) MISC      levothyroxine (SYNTHROID) 112 MCG tablet Take 1 tablet (112 mcg total) by mouth daily before breakfast. 30 tablet 0   melatonin 5 MG TABS Take 2 tablets (10 mg total) by mouth at bedtime as needed (sleep). 60 tablet 0   metoprolol succinate (TOPROL-XL) 25 MG 24 hr tablet Take 1 tablet (25 mg total) by mouth daily. Take with or immediately following a meal. 90 tablet 3   midodrine (PROAMATINE) 10 MG tablet Take 1 tablet (10 mg total) by mouth Every Tuesday,Thursday,and Saturday with dialysis. (Patient not taking: Reported on 12/09/2021) 30 tablet 0   ONETOUCH ULTRA test strip      senna (SENOKOT) 8.6 MG tablet Take 1 tablet by mouth as needed for constipation. (Patient not taking: Reported on 12/09/2021)     sevelamer carbonate (RENVELA) 800 MG tablet Take 1 tablet (800 mg total) by mouth 3 (three) times daily with meals. 90 tablet 0   traMADol (ULTRAM) 50 MG tablet Take 1 tablet (50 mg total) by  mouth every 12 (twelve) hours as needed for moderate pain. 30 tablet 0   TRULICITY 1.5 JJ/9.4RD SOPN Inject 1.5 mg into the skin every Monday.     No facility-administered  medications prior to visit.    Review of Systems  Review of Systems  Constitutional: Negative.   HENT: Negative.    Respiratory:  Negative for cough, chest tightness, shortness of breath and wheezing.   Cardiovascular: Negative.  Negative for leg swelling.   Physical Exam  BP 114/60 (BP Location: Left Wrist, Cuff Size: Normal)   Pulse (!) 103   Temp 98.2 F (36.8 C) (Temporal)   Ht '5\' 4"'$  (1.626 m)   Wt (!) 343 lb (155.6 kg)   LMP 07/17/2012   SpO2 96%   BMI 58.88 kg/m  Physical Exam Constitutional:      Appearance: Normal appearance. She is obese.  HENT:     Head: Normocephalic and atraumatic.  Cardiovascular:     Rate and Rhythm: Normal rate. Rhythm irregular.  Pulmonary:     Effort: Pulmonary effort is normal.     Breath sounds: Normal breath sounds. No wheezing, rhonchi or rales.  Musculoskeletal:        General: Normal range of motion.     Comments: Amb with cane   Skin:    General: Skin is warm and dry.  Neurological:     Mental Status: She is alert.  Psychiatric:        Mood and Affect: Mood normal.        Behavior: Behavior normal.        Thought Content: Thought content normal.      Lab Results:  CBC    Component Value Date/Time   WBC 10.1 10/24/2021 1402   RBC 3.48 (L) 10/24/2021 1402   HGB 12.6 12/09/2021 0623   HGB 9.9 (L) 02/27/2020 0931   HCT 37.0 12/09/2021 0623   HCT 32.2 (L) 02/27/2020 0931   PLT 179 10/24/2021 1402   PLT 232 02/27/2020 0931   MCV 106.9 (H) 10/24/2021 1402   MCV 89 02/27/2020 0931   MCV 90 03/09/2014 1533   MCH 34.2 (H) 10/24/2021 1402   MCHC 32.0 10/24/2021 1402   RDW 15.4 10/24/2021 1402   RDW 13.0 02/27/2020 0931   RDW 15.2 (H) 03/09/2014 1533   LYMPHSABS 1.5 10/21/2021 1340   LYMPHSABS 1.5 03/09/2014 1533   MONOABS 0.7 10/21/2021 1340    MONOABS 0.6 03/09/2014 1533   EOSABS 0.3 10/21/2021 1340   EOSABS 0.2 03/09/2014 1533   BASOSABS 0.0 10/21/2021 1340   BASOSABS 0.1 03/09/2014 1533    BMET    Component Value Date/Time   NA 136 12/09/2021 0623   NA 144 02/27/2020 0931   NA 141 03/09/2014 1533   K 4.1 12/09/2021 0623   K 4.2 03/09/2014 1533   CL 100 12/09/2021 0623   CL 101 03/09/2014 1533   CO2 24 10/24/2021 1402   CO2 30 03/09/2014 1533   GLUCOSE 146 (H) 12/09/2021 0623   GLUCOSE 95 03/09/2014 1533   BUN 33 (H) 12/09/2021 0623   BUN 40 (H) 02/27/2020 0931   BUN 35 (H) 03/09/2014 1533   CREATININE 7.00 (H) 12/09/2021 0623   CREATININE 1.50 (H) 03/09/2014 1533   CALCIUM 9.3 10/24/2021 1402   CALCIUM 8.8 03/09/2014 1533   GFRNONAA 5 (L) 10/24/2021 1402   GFRNONAA 39 (L) 03/09/2014 1533   GFRNONAA 55 (L) 07/31/2013 1521   GFRAA 35 (L) 02/27/2020 0931   GFRAA 47 (L) 03/09/2014 1533   GFRAA >60 07/31/2013 1521    BNP    Component Value Date/Time   BNP 196.5 (H) 06/06/2021 1329    ProBNP No results found for: "  PROBNP"  Imaging: MM 3D SCREEN BREAST BILATERAL  Result Date: 11/24/2021 CLINICAL DATA:  Screening. EXAM: DIGITAL SCREENING BILATERAL MAMMOGRAM WITH TOMOSYNTHESIS AND CAD TECHNIQUE: Bilateral screening digital craniocaudal and mediolateral oblique mammograms were obtained. Bilateral screening digital breast tomosynthesis was performed. The images were evaluated with computer-aided detection. COMPARISON:  Previous exam(s). ACR Breast Density Category a: The breast tissue is almost entirely fatty. FINDINGS: There are no findings suspicious for malignancy. IMPRESSION: No mammographic evidence of malignancy. A result letter of this screening mammogram will be mailed directly to the patient. RECOMMENDATION: Screening mammogram in one year. (Code:[id number]-B-01Y) BI-RADS CATEGORY  1: Negative. Electronically Signed   By: Marin Olp M.D.   On: 11/24/2021 09:19     Assessment & Plan:   Obstructive sleep  apnea - Patient is compliant with noninvasive ventilator. Average usage 4 hours 35 mins.  - Min EPAP 4; Max EPAP 15cm h20; PS 6-22. Airleaks median 0.8L/min. AHI 0.6 - Continue to encourage patient wear trilogy at bedtime and with naps   Acute on chronic respiratory failure with hypoxemia (HCC) - Continue 2 L supplemental oxygen as needed with exertion to maintain O2 greater than 88 to 90% and at bedtime  ESRD on hemodialysis ([place]) - Continues hemodialysis Tuesday, Thursday and Saturday.  Planning for AV fistula placement 12/09/21   Martyn Ehrich, NP 12/12/2021

## 2021-12-07 NOTE — Patient Instructions (Signed)
Excellent compliance with Trilogy ventilator No changes needed today Continue to wear non-invasive ventilator every night 4-6 hours or longer Continue to wear oxygen at bedtime Use Albuterol 2 puffs every 3-6 hours if you develop shortness of breath/wheezing  Follow-up 6 months with Dr. Halford Chessman in [place] (if not openings- ok to schedule with [facility])

## 2021-12-09 ENCOUNTER — Encounter: Payer: Self-pay | Admitting: Vascular Surgery

## 2021-12-09 ENCOUNTER — Ambulatory Visit: Payer: BC Managed Care – PPO | Admitting: Urgent Care

## 2021-12-09 ENCOUNTER — Other Ambulatory Visit: Payer: Self-pay

## 2021-12-09 ENCOUNTER — Encounter
Admission: RE | Disposition: A | Discharge status: Home / Self Care | Payer: Self-pay | Source: Ambulatory Visit | Attending: Vascular Surgery

## 2021-12-09 ENCOUNTER — Ambulatory Visit
Admission: RE | Admit: 2021-12-09 | Discharge: 2021-12-09 | Disposition: A | Discharge status: Home / Self Care | Payer: BC Managed Care – PPO | Source: Ambulatory Visit | Attending: Vascular Surgery | Admitting: Vascular Surgery

## 2021-12-09 DIAGNOSIS — Z7901 Long term (current) use of anticoagulants: Secondary | ICD-10-CM | POA: Insufficient documentation

## 2021-12-09 DIAGNOSIS — Z79890 Hormone replacement therapy (postmenopausal): Secondary | ICD-10-CM | POA: Diagnosis not present

## 2021-12-09 DIAGNOSIS — J449 Chronic obstructive pulmonary disease, unspecified: Secondary | ICD-10-CM | POA: Insufficient documentation

## 2021-12-09 DIAGNOSIS — G4730 Sleep apnea, unspecified: Secondary | ICD-10-CM | POA: Diagnosis not present

## 2021-12-09 DIAGNOSIS — Z6841 Body Mass Index (BMI) 40.0 and over, adult: Secondary | ICD-10-CM | POA: Insufficient documentation

## 2021-12-09 DIAGNOSIS — Z87891 Personal history of nicotine dependence: Secondary | ICD-10-CM | POA: Insufficient documentation

## 2021-12-09 DIAGNOSIS — E039 Hypothyroidism, unspecified: Secondary | ICD-10-CM | POA: Diagnosis not present

## 2021-12-09 DIAGNOSIS — N186 End stage renal disease: Secondary | ICD-10-CM | POA: Diagnosis not present

## 2021-12-09 DIAGNOSIS — Z853 Personal history of malignant neoplasm of breast: Secondary | ICD-10-CM | POA: Insufficient documentation

## 2021-12-09 DIAGNOSIS — E1122 Type 2 diabetes mellitus with diabetic chronic kidney disease: Secondary | ICD-10-CM | POA: Diagnosis not present

## 2021-12-09 DIAGNOSIS — I5030 Unspecified diastolic (congestive) heart failure: Secondary | ICD-10-CM | POA: Insufficient documentation

## 2021-12-09 DIAGNOSIS — I4819 Other persistent atrial fibrillation: Secondary | ICD-10-CM | POA: Insufficient documentation

## 2021-12-09 DIAGNOSIS — Z01812 Encounter for preprocedural laboratory examination: Secondary | ICD-10-CM

## 2021-12-09 DIAGNOSIS — Z923 Personal history of irradiation: Secondary | ICD-10-CM | POA: Insufficient documentation

## 2021-12-09 DIAGNOSIS — I5033 Acute on chronic diastolic (congestive) heart failure: Secondary | ICD-10-CM | POA: Diagnosis not present

## 2021-12-09 DIAGNOSIS — I132 Hypertensive heart and chronic kidney disease with heart failure and with stage 5 chronic kidney disease, or end stage renal disease: Secondary | ICD-10-CM | POA: Diagnosis not present

## 2021-12-09 DIAGNOSIS — Z86711 Personal history of pulmonary embolism: Secondary | ICD-10-CM | POA: Diagnosis not present

## 2021-12-09 HISTORY — PX: AV FISTULA PLACEMENT: SHX1204

## 2021-12-09 HISTORY — DX: Diverticulosis of intestine, part unspecified, without perforation or abscess without bleeding: K57.90

## 2021-12-09 HISTORY — DX: Long term (current) use of anticoagulants: Z79.01

## 2021-12-09 HISTORY — DX: Sleep disorder, unspecified: G47.9

## 2021-12-09 HISTORY — DX: Benign neoplasm of left adrenal gland: D35.02

## 2021-12-09 LAB — POCT I-STAT, CHEM 8
BUN: 33 mg/dL — ABNORMAL HIGH (ref 6–20)
Calcium, Ion: 1.1 mmol/L — ABNORMAL LOW (ref 1.15–1.40)
Chloride: 100 mmol/L (ref 98–111)
Creatinine, Ser: 7 mg/dL — ABNORMAL HIGH (ref 0.44–1.00)
Glucose, Bld: 146 mg/dL — ABNORMAL HIGH (ref 70–99)
HCT: 37 % (ref 36.0–46.0)
Hemoglobin: 12.6 g/dL (ref 12.0–15.0)
Potassium: 4.1 mmol/L (ref 3.5–5.1)
Sodium: 136 mmol/L (ref 135–145)
TCO2: 28 mmol/L (ref 22–32)

## 2021-12-09 LAB — TYPE AND SCREEN
ABO/RH(D): O NEG
Antibody Screen: NEGATIVE

## 2021-12-09 LAB — GLUCOSE, CAPILLARY: Glucose-Capillary: 139 mg/dL — ABNORMAL HIGH (ref 70–99)

## 2021-12-09 SURGERY — ARTERIOVENOUS (AV) FISTULA CREATION
Anesthesia: General | Laterality: Left

## 2021-12-09 MED ORDER — CEFAZOLIN SODIUM-DEXTROSE 2-4 GM/100ML-% IV SOLN
2.0000 g | INTRAVENOUS | Status: AC
  Administered 2021-12-09: 1 g via INTRAVENOUS
  Administered 2021-12-09: 2 g via INTRAVENOUS

## 2021-12-09 MED ORDER — HEMOSTATIC AGENTS (NO CHARGE) OPTIME
TOPICAL | Status: DC | PRN
  Administered 2021-12-09: 1 via TOPICAL

## 2021-12-09 MED ORDER — ESMOLOL HCL 100 MG/10ML IV SOLN
INTRAVENOUS | Status: DC | PRN
  Administered 2021-12-09: 10 mg via INTRAVENOUS

## 2021-12-09 MED ORDER — FENTANYL CITRATE (PF) 100 MCG/2ML IJ SOLN: 25.0000 ug | INTRAMUSCULAR | Status: DC | PRN

## 2021-12-09 MED ORDER — PROPOFOL 1000 MG/100ML IV EMUL
INTRAVENOUS | Status: AC
  Filled 2021-12-09: qty 100

## 2021-12-09 MED ORDER — HEPARIN SODIUM (PORCINE) 5000 UNIT/ML IJ SOLN
INTRAMUSCULAR | Status: AC
  Filled 2021-12-09: qty 1

## 2021-12-09 MED ORDER — BUPIVACAINE LIPOSOME 1.3 % IJ SUSP
INTRAMUSCULAR | Status: DC | PRN
  Administered 2021-12-09: 50 mL via INTRAMUSCULAR

## 2021-12-09 MED ORDER — CHLORHEXIDINE GLUCONATE 0.12 % MT SOLN
OROMUCOSAL | Status: AC
  Administered 2021-12-09: 15 mL via OROMUCOSAL
  Filled 2021-12-09: qty 15

## 2021-12-09 MED ORDER — BUPIVACAINE HCL (PF) 0.5 % IJ SOLN
INTRAMUSCULAR | Status: AC
  Filled 2021-12-09: qty 30

## 2021-12-09 MED ORDER — OXYCODONE HCL 5 MG/5ML PO SOLN: 5.0000 mg | Freq: Once | ORAL | Status: DC | PRN

## 2021-12-09 MED ORDER — GLYCOPYRROLATE 0.2 MG/ML IJ SOLN
INTRAMUSCULAR | Status: DC | PRN
  Administered 2021-12-09: .2 mg via INTRAVENOUS

## 2021-12-09 MED ORDER — FAMOTIDINE 20 MG PO TABS
ORAL_TABLET | ORAL | Status: AC
  Administered 2021-12-09: 20 mg via ORAL
  Filled 2021-12-09: qty 1

## 2021-12-09 MED ORDER — CEFAZOLIN SODIUM-DEXTROSE 2-4 GM/100ML-% IV SOLN
INTRAVENOUS | Status: AC
  Filled 2021-12-09: qty 100

## 2021-12-09 MED ORDER — SUGAMMADEX SODIUM 500 MG/5ML IV SOLN
INTRAVENOUS | Status: DC | PRN
  Administered 2021-12-09: 500 mg via INTRAVENOUS

## 2021-12-09 MED ORDER — DEXAMETHASONE SODIUM PHOSPHATE 10 MG/ML IJ SOLN
INTRAMUSCULAR | Status: DC | PRN
  Administered 2021-12-09: 5 mg via INTRAVENOUS

## 2021-12-09 MED ORDER — ORAL CARE MOUTH RINSE: 15.0000 mL | Freq: Once | OROMUCOSAL | Status: AC

## 2021-12-09 MED ORDER — PHENYLEPHRINE 80 MCG/ML (10ML) SYRINGE FOR IV PUSH (FOR BLOOD PRESSURE SUPPORT)
PREFILLED_SYRINGE | INTRAVENOUS | Status: DC | PRN
  Administered 2021-12-09: 40 ug via INTRAVENOUS
  Administered 2021-12-09 (×2): 80 ug via INTRAVENOUS
  Administered 2021-12-09: 160 ug via INTRAVENOUS

## 2021-12-09 MED ORDER — FENTANYL CITRATE (PF) 100 MCG/2ML IJ SOLN
INTRAMUSCULAR | Status: DC | PRN
  Administered 2021-12-09: 50 ug via INTRAVENOUS

## 2021-12-09 MED ORDER — FAMOTIDINE 20 MG PO TABS: 20.0000 mg | ORAL_TABLET | Freq: Once | ORAL | Status: AC

## 2021-12-09 MED ORDER — HYDROMORPHONE HCL 1 MG/ML IJ SOLN: 1.0000 mg | Freq: Once | INTRAMUSCULAR | Status: DC | PRN

## 2021-12-09 MED ORDER — SODIUM CHLORIDE 0.9 % IV SOLN
INTRAVENOUS | Status: DC | PRN
  Administered 2021-12-09: 501 mL

## 2021-12-09 MED ORDER — PROPOFOL 10 MG/ML IV BOLUS
INTRAVENOUS | Status: DC | PRN
  Administered 2021-12-09: 150 mg via INTRAVENOUS

## 2021-12-09 MED ORDER — BUPIVACAINE LIPOSOME 1.3 % IJ SUSP
INTRAMUSCULAR | Status: AC
  Filled 2021-12-09: qty 20

## 2021-12-09 MED ORDER — ONDANSETRON HCL 4 MG/2ML IJ SOLN: 4.0000 mg | Freq: Four times a day (QID) | INTRAMUSCULAR | Status: DC | PRN

## 2021-12-09 MED ORDER — FENTANYL CITRATE (PF) 100 MCG/2ML IJ SOLN
INTRAMUSCULAR | Status: AC
  Filled 2021-12-09: qty 2

## 2021-12-09 MED ORDER — SUCCINYLCHOLINE CHLORIDE 200 MG/10ML IV SOSY
PREFILLED_SYRINGE | INTRAVENOUS | Status: DC | PRN
  Administered 2021-12-09: 120 mg via INTRAVENOUS

## 2021-12-09 MED ORDER — PHENYLEPHRINE HCL-NACL 20-0.9 MG/250ML-% IV SOLN
INTRAVENOUS | Status: DC | PRN
  Administered 2021-12-09: 25 ug/min via INTRAVENOUS

## 2021-12-09 MED ORDER — CHLORHEXIDINE GLUCONATE 0.12 % MT SOLN: 15.0000 mL | Freq: Once | OROMUCOSAL | Status: AC

## 2021-12-09 MED ORDER — DROPERIDOL 2.5 MG/ML IJ SOLN: 0.6250 mg | Freq: Once | INTRAMUSCULAR | Status: DC | PRN

## 2021-12-09 MED ORDER — OXYCODONE HCL 5 MG PO TABS: 5.0000 mg | ORAL_TABLET | Freq: Once | ORAL | Status: DC | PRN

## 2021-12-09 MED ORDER — HYDROCODONE-ACETAMINOPHEN 5-325 MG PO TABS: 1.0000 | ORAL_TABLET | Freq: Four times a day (QID) | ORAL | 0 refills | Status: DC | PRN

## 2021-12-09 MED ORDER — ROCURONIUM BROMIDE 100 MG/10ML IV SOLN
INTRAVENOUS | Status: DC | PRN
  Administered 2021-12-09: 45 mg via INTRAVENOUS
  Administered 2021-12-09: 5 mg via INTRAVENOUS
  Administered 2021-12-09: 40 mg via INTRAVENOUS
  Administered 2021-12-09: 30 mg via INTRAVENOUS

## 2021-12-09 MED ORDER — CHLORHEXIDINE GLUCONATE CLOTH 2 % EX PADS: 6.0000 | MEDICATED_PAD | Freq: Once | CUTANEOUS | Status: DC

## 2021-12-09 MED ORDER — EPHEDRINE SULFATE (PRESSORS) 50 MG/ML IJ SOLN
INTRAMUSCULAR | Status: DC | PRN
  Administered 2021-12-09: 5 mg via INTRAVENOUS
  Administered 2021-12-09: 2.5 mg via INTRAVENOUS
  Administered 2021-12-09 (×2): 5 mg via INTRAVENOUS

## 2021-12-09 MED ORDER — SODIUM CHLORIDE 0.9 % IV SOLN: INTRAVENOUS | Status: DC

## 2021-12-09 MED ORDER — PROMETHAZINE HCL 25 MG/ML IJ SOLN: 6.2500 mg | INTRAMUSCULAR | Status: DC | PRN

## 2021-12-09 MED ORDER — ACETAMINOPHEN 10 MG/ML IV SOLN: 1000.0000 mg | Freq: Once | INTRAVENOUS | Status: DC | PRN

## 2021-12-09 MED ORDER — LIDOCAINE HCL (CARDIAC) PF 100 MG/5ML IV SOSY
PREFILLED_SYRINGE | INTRAVENOUS | Status: DC | PRN
  Administered 2021-12-09: 100 mg via INTRAVENOUS

## 2021-12-09 MED ORDER — ONDANSETRON HCL 4 MG/2ML IJ SOLN
INTRAMUSCULAR | Status: DC | PRN
  Administered 2021-12-09 (×2): 4 mg via INTRAVENOUS

## 2021-12-09 SURGICAL SUPPLY — 55 items
ADH SKN CLS APL DERMABOND .7 (GAUZE/BANDAGES/DRESSINGS) ×1
APL PRP STRL LF DISP 70% ISPRP (MISCELLANEOUS) ×2
BAG DECANTER FOR FLEXI CONT (MISCELLANEOUS) ×1 IMPLANT
BLADE SURG SZ11 CARB STEEL (BLADE) ×1 IMPLANT
BOOT SUTURE AID YELLOW STND (SUTURE) ×1 IMPLANT
BRUSH SCRUB EZ  4% CHG (MISCELLANEOUS) ×1
BRUSH SCRUB EZ 4% CHG (MISCELLANEOUS) ×1 IMPLANT
CHLORAPREP W/TINT 26 (MISCELLANEOUS) ×1 IMPLANT
DERMABOND ADVANCED .7 DNX12 (GAUZE/BANDAGES/DRESSINGS) IMPLANT
DRESSING SURGICEL FIBRLLR 1X2 (HEMOSTASIS) ×1 IMPLANT
DRSG SURGICEL FIBRILLAR 1X2 (HEMOSTASIS) ×1
ELECT CAUTERY BLADE 6.4 (BLADE) ×1 IMPLANT
ELECT REM PT RETURN 9FT ADLT (ELECTROSURGICAL) ×1
ELECTRODE REM PT RTRN 9FT ADLT (ELECTROSURGICAL) ×1 IMPLANT
GEL ULTRASOUND 20GR AQUASONIC (MISCELLANEOUS) IMPLANT
GLOVE SURG SYN 7.0 (GLOVE) ×1 IMPLANT
GLOVE SURG SYN 7.0 PF PI (GLOVE) ×1 IMPLANT
GLOVE SURG SYN 8.0 (GLOVE) ×1 IMPLANT
GLOVE SURG SYN 8.0 PF PI (GLOVE) ×1 IMPLANT
GOWN STRL REUS W/ TWL LRG LVL3 (GOWN DISPOSABLE) ×2 IMPLANT
GOWN STRL REUS W/ TWL XL LVL3 (GOWN DISPOSABLE) ×1 IMPLANT
GOWN STRL REUS W/TWL LRG LVL3 (GOWN DISPOSABLE) ×2
GOWN STRL REUS W/TWL XL LVL3 (GOWN DISPOSABLE) ×1
IV NS 500ML (IV SOLUTION) ×1
IV NS 500ML BAXH (IV SOLUTION) ×1 IMPLANT
KIT TURNOVER KIT A (KITS) ×1 IMPLANT
LABEL OR SOLS (LABEL) ×1 IMPLANT
LOOP RED MAXI  1X406MM (MISCELLANEOUS) ×1
LOOP VESSEL MAXI 1X406 RED (MISCELLANEOUS) ×1 IMPLANT
LOOP VESSEL MINI 0.8X406 BLUE (MISCELLANEOUS) ×2 IMPLANT
LOOPS BLUE MINI 0.8X406MM (MISCELLANEOUS) ×2
MANIFOLD NEPTUNE II (INSTRUMENTS) ×1 IMPLANT
NDL FILTER BLUNT 18X1 1/2 (NEEDLE) ×1 IMPLANT
NDL HYPO 30X.5 LL (NEEDLE) IMPLANT
NEEDLE FILTER BLUNT 18X1 1/2 (NEEDLE) ×1 IMPLANT
NEEDLE HYPO 30X.5 LL (NEEDLE) IMPLANT
PACK EXTREMITY ARMC (MISCELLANEOUS) ×1 IMPLANT
PAD PREP 24X41 OB/GYN DISP (PERSONAL CARE ITEMS) ×1 IMPLANT
STOCKINETTE 48X4 2 PLY STRL (GAUZE/BANDAGES/DRESSINGS) ×1 IMPLANT
STOCKINETTE STRL 4IN 9604848 (GAUZE/BANDAGES/DRESSINGS) ×1 IMPLANT
SUT MNCRL+ 5-0 UNDYED PC-3 (SUTURE) ×1 IMPLANT
SUT MONOCRYL 5-0 (SUTURE) ×1
SUT PROLENE 6 0 BV (SUTURE) ×4 IMPLANT
SUT SILK 2 0 (SUTURE) ×1
SUT SILK 2-0 18XBRD TIE 12 (SUTURE) ×1 IMPLANT
SUT SILK 3 0 (SUTURE) ×1
SUT SILK 3-0 18XBRD TIE 12 (SUTURE) ×1 IMPLANT
SUT SILK 4 0 (SUTURE) ×1
SUT SILK 4-0 18XBRD TIE 12 (SUTURE) ×1 IMPLANT
SUT VIC AB 3-0 SH 27 (SUTURE) ×1
SUT VIC AB 3-0 SH 27X BRD (SUTURE) ×1 IMPLANT
SYR 20ML LL LF (SYRINGE) ×1 IMPLANT
SYR 3ML LL SCALE MARK (SYRINGE) ×1 IMPLANT
TRAP FLUID SMOKE EVACUATOR (MISCELLANEOUS) ×1 IMPLANT
WATER STERILE IRR 500ML POUR (IV SOLUTION) ×1 IMPLANT

## 2021-12-09 NOTE — H&P (View-Only) (Signed)
MRN : [id number]  Nicole Schmidt is a 56 y.o. (Jun 20, 1965) female who presents with chief complaint of check access.  History of Present Illness:   The patient presents to [facility] for creation of a new dialysis access.     Recent imaging 10/08/2021 showed:  Sclerotic vein there is now occlusion of the arterial anastomosis and first centimeters or 2 of the cephalic vein.  More proximal to this the vein is sclerotic and does not appear that it will ever become a viable option for fistula   The patient denies redness or swelling at the access site. The patient denies fever or chills at home or while on dialysis.   No recent shortening of the patient's walking distance or new symptoms consistent with claudication.  No history of rest pain symptoms. No new ulcers or wounds of the lower extremities have occurred.   The patient denies amaurosis fugax or recent TIA symptoms. There are no recent neurological changes noted. There is no history of DVT, PE or superficial thrombophlebitis. No recent episodes of angina or shortness of breath documented.  Current Meds  Medication Sig   amLODipine (NORVASC) 5 MG tablet Take 1 tablet (5 mg total) by mouth daily.   apixaban (ELIQUIS) 5 MG TABS tablet Take 1 tablet (5 mg total) by mouth 2 (two) times daily.   cloNIDine (CATAPRES) 0.2 MG tablet Take 0.2 mg by mouth 2 (two) times daily.   CRANBERRY PO Take 1 capsule by mouth daily.   ergocalciferol (VITAMIN D2) 1.25 MG (50000 UT) capsule Take 1 capsule (50,000 Units total) by mouth once a week.   ezetimibe (ZETIA) 10 MG tablet Take 10 mg by mouth daily.   ferric gluconate 250 mg in sodium chloride 0.9 % 250 mL Inject 250 mg into the vein Every Tuesday,Thursday,and Saturday with dialysis.   folic acid (FOLVITE) 1 MG tablet Take 1 tablet (1 mg total) by mouth daily.   gabapentin (NEURONTIN) 300 MG capsule Take 1 capsule (300 mg total) by mouth at bedtime.   Lancets (ONETOUCH DELICA PLUS  [id number]) MISC    levothyroxine (SYNTHROID) 112 MCG tablet Take 1 tablet (112 mcg total) by mouth daily before breakfast.   melatonin 5 MG TABS Take 2 tablets (10 mg total) by mouth at bedtime as needed (sleep).   metoprolol succinate (TOPROL-XL) 25 MG 24 hr tablet Take 1 tablet (25 mg total) by mouth daily. Take with or immediately following a meal.   ONETOUCH ULTRA test strip    sevelamer carbonate (RENVELA) 800 MG tablet Take 1 tablet (800 mg total) by mouth 3 (three) times daily with meals.   traMADol (ULTRAM) 50 MG tablet Take 1 tablet (50 mg total) by mouth every 12 (twelve) hours as needed for moderate pain.    Past Medical History:  Diagnosis Date   (HFpEF) heart failure with preserved ejection fraction ([place])    a. 10/2018 Echo: EF 60-65%, diast dysfxn, RVSP 50.7 mmHg; b. 01/2020 Echo: EF 65-70%, G2DD; c. 01/2021 Echo: EF 60-65%, no rwma, mod LVH. Nl RV fxn, mildly enlarged RV. Mod BAE. Triv MR. d. 05/2021 Echo: EF 55-60; no RWMAs, mild LVH, mild TR.   Acquired hypothyroidism 02/09/2020   Adenoma of left adrenal gland    Anemia    Aortic atherosclerosis (HCC)    Arthritis    Breast cancer of upper-inner quadrant of right  female breast ([place])    Right breast invasive CA and DCIS , 7 mm T1,N0,M0. Er/PR pos, her 2 negative.  Margins 1 mm.   COPD (chronic obstructive pulmonary disease) (HCC)    Diabetes mellitus type 2 in obese ([place]) 02/09/2020   Difficult intubation 09/28/2021   a.) mallampati III-IV; BMI >55 kg/m   Diverticulosis    Dyslipidemia 02/09/2020   ESBL (extended spectrum beta-lactamase) producing bacteria infection 03/20/2021   a. urine culture (+) for ESBL producing E.coli and P. mirabilis   ESRD (end stage renal disease) on dialysis ([place])    a. T-Th-Sat HD initiated 02/2021   Essential hypertension    Gout    History of kidney stones    Long term current use of anticoagulant    a.) apixaban   Menopause    age 71   Morbid obesity ([place])    OSA treated with BiPAP     a. on nocturnal PAP therapy; Trilogy 100 ventilator   Permanent atrial fibrillation ([place])    a. Dx 10/2018 in setting of PNA. b. CHA2DS2-VASc = 7 (sex, HFpEF, HTN, PE x2, aortic plaque, T2DM); c. s/p successful DCCV 11/2018. d. subsequent recurrent Afib; rate/rhythm maintained on oral metoprolol succinate; chronically anticoagulated using standard dose apixaban.   Personal history of radiation therapy    Pulmonary embolism ([place]) 10/2014   a. on chronic apixaban   Restrictive lung disease    Shingles 10/24/2021   Sleep difficulties    a.) takes melatonin   Thyroid goiter     Past Surgical History:  Procedure Laterality Date   A/V FISTULAGRAM Left 09/02/2021   Procedure: A/V Fistulagram;  Surgeon: Katha Cabal, MD;  Location: [facility];  Service: Cardiovascular;  Laterality: Left;   AV FISTULA PLACEMENT Left 07/22/2021   Procedure: ARTERIOVENOUS (AV) FISTULA CREATION (RADIALCEPHALIC);  Surgeon: Katha Cabal, MD;  Location: [facility];  Service: Vascular;  Laterality: Left;   BREAST BIOPSY Right 2014   breast ca   BREAST EXCISIONAL BIOPSY Right 07/19/2012   breast ca   BREAST SURGERY Right 2014   with sentinel node bx subareaolar duct excision   CARDIOVERSION N/A 11/28/2018   Procedure: CARDIOVERSION;  Surgeon: Minna Merritts, MD;  Location: [facility];  Service: Cardiovascular;  Laterality: N/A;   COLONOSCOPY WITH PROPOFOL N/A 01/30/2017   Procedure: COLONOSCOPY WITH PROPOFOL;  Surgeon: Christene Lye, MD;  Location: [facility];  Service: Endoscopy;  Laterality: N/A;   CYSTOSCOPY W/ RETROGRADES  06/17/2021   Procedure: CYSTOSCOPY WITH RETROGRADE PYELOGRAM;  Surgeon: Billey Co, MD;  Location: [facility];  Service: Urology;;   CYSTOSCOPY W/ URETERAL STENT PLACEMENT Left 02/14/2021   Procedure: CYSTOSCOPY WITH RETROGRADE PYELOGRAM/URETERAL STENT PLACEMENT;  Surgeon: Billey Co, MD;  Location: [facility];  Service: Urology;  Laterality: Left;    CYSTOSCOPY/URETEROSCOPY/HOLMIUM LASER/STENT PLACEMENT Left 06/17/2021   Procedure: CYSTOSCOPY/URETEROSCOPY/HOLMIUM LASER/STENT EXCHANGE;  Surgeon: Billey Co, MD;  Location: [facility];  Service: Urology;  Laterality: Left;   DIALYSIS/PERMA CATHETER INSERTION N/A 02/24/2021   Procedure: DIALYSIS/PERMA CATHETER INSERTION;  Surgeon: Algernon Huxley, MD;  Location: [facility];  Service: Cardiovascular;  Laterality: N/A;   PERIPHERAL VASCULAR THROMBECTOMY Left 09/28/2021   Procedure: PERIPHERAL VASCULAR THROMBECTOMY;  Surgeon: Katha Cabal, MD;  Location: [facility];  Service: Cardiovascular;  Laterality: Left;    Social History Social History   Tobacco Use   Smoking status: Never    Passive exposure: Never   Smokeless tobacco:  Never  Vaping Use   Vaping Use: Never used  Substance Use Topics   Alcohol use: No    Alcohol/week: 0.0 standard drinks of alcohol   Drug use: No    Family History Family History  Problem Relation Age of Onset   Diabetes Father    Hypertension Father    Parkinson's disease Father    Hypertension Mother    Rheum arthritis Mother    Atrial fibrillation Mother    Heart failure Mother    Heart attack Mother        died in her mid-70's.   Colon cancer Cousin 54   Breast cancer Neg Hx     Allergies  Allergen Reactions   Spironolactone     Hyperkalemia  Pt unaware of this allergy     REVIEW OF SYSTEMS (Negative unless checked)  Constitutional: '[]'$ Weight loss  '[]'$ Fever  '[]'$ Chills Cardiac: '[]'$ Chest pain   '[]'$ Chest pressure   '[]'$ Palpitations   '[]'$ Shortness of breath when laying flat   '[]'$ Shortness of breath with exertion. Vascular:  '[]'$ Pain in legs with walking   '[]'$ Pain in legs at rest  '[]'$ History of DVT   '[]'$ Phlebitis   '[]'$ Swelling in legs   '[]'$ Varicose veins   '[]'$ Non-healing ulcers Pulmonary:   '[]'$ Uses home oxygen   '[]'$ Productive cough   '[]'$ Hemoptysis   '[]'$ Wheeze  '[]'$ COPD   '[]'$ Asthma Neurologic:  '[]'$ Dizziness   '[]'$ Seizures   '[]'$ History of stroke    '[]'$ History of TIA  '[]'$ Aphasia   '[]'$ Vissual changes   '[]'$ Weakness or numbness in arm   '[]'$ Weakness or numbness in leg Musculoskeletal:   '[]'$ Joint swelling   '[]'$ Joint pain   '[]'$ Low back pain Hematologic:  '[]'$ Easy bruising  '[]'$ Easy bleeding   '[]'$ Hypercoagulable state   '[]'$ Anemic Gastrointestinal:  '[]'$ Diarrhea   '[]'$ Vomiting  '[]'$ Gastroesophageal reflux/heartburn   '[]'$ Difficulty swallowing. Genitourinary:  '[x]'$ Chronic kidney disease   '[]'$ Difficult urination  '[]'$ Frequent urination   '[]'$ Blood in urine Skin:  '[]'$ Rashes   '[]'$ Ulcers  Psychological:  '[]'$ History of anxiety   '[]'$  History of major depression.  Physical Examination  Vitals:   12/09/21 0626  BP: 120/81  Pulse: 91  Resp: 16  Temp: (!) 97.2 F (36.2 C)  TempSrc: Temporal  SpO2: 98%  Weight: (!) 155.6 kg  Height: '5\' 4"'$  (1.626 m)   Body mass index is 58.88 kg/m. Gen: WD/WN, NAD Head: [place]/AT, No temporalis wasting.  Ear/Nose/Throat: Hearing grossly intact, nares w/o erythema or drainage Eyes: PER, EOMI, sclera nonicteric.  Neck: Supple, no gross masses or lesions.  No JVD.  Pulmonary:  Good air movement, no audible wheezing, no use of accessory muscles.  Cardiac: RRR, precordium non-hyperdynamic. Vascular:    Vessel Right Left  Radial Palpable Palpable  Brachial Palpable Palpable  Gastrointestinal: soft, non-distended. No guarding/no peritoneal signs.  Musculoskeletal: M/S 5/5 throughout.  No deformity.  Neurologic: CN 2-12 intact. Pain and light touch intact in extremities.  Symmetrical.  Speech is fluent. Motor exam as listed above. Psychiatric: Judgment intact, Mood & affect appropriate for pt's clinical situation. Dermatologic: No rashes or ulcers noted.  No changes consistent with cellulitis.   CBC Lab Results  Component Value Date   WBC 10.1 10/24/2021   HGB 12.6 12/09/2021   HCT 37.0 12/09/2021   MCV 106.9 (H) 10/24/2021   PLT 179 10/24/2021    BMET    Component Value Date/Time   NA 136 12/09/2021 0623   NA 144 02/27/2020 0931   NA  141 03/09/2014 1533   K 4.1 12/09/2021 0623   K  4.2 03/09/2014 1533   CL 100 12/09/2021 0623   CL 101 03/09/2014 1533   CO2 24 10/24/2021 1402   CO2 30 03/09/2014 1533   GLUCOSE 146 (H) 12/09/2021 0623   GLUCOSE 95 03/09/2014 1533   BUN 33 (H) 12/09/2021 0623   BUN 40 (H) 02/27/2020 0931   BUN 35 (H) 03/09/2014 1533   CREATININE 7.00 (H) 12/09/2021 0623   CREATININE 1.50 (H) 03/09/2014 1533   CALCIUM 9.3 10/24/2021 1402   CALCIUM 8.8 03/09/2014 1533   GFRNONAA 5 (L) 10/24/2021 1402   GFRNONAA 39 (L) 03/09/2014 1533   GFRNONAA 55 (L) 07/31/2013 1521   GFRAA 35 (L) 02/27/2020 0931   GFRAA 47 (L) 03/09/2014 1533   GFRAA >60 07/31/2013 1521   Estimated Creatinine Clearance: 13.2 mL/min (A) (by C-G formula based on SCr of 7 mg/dL (H)).  COAG Lab Results  Component Value Date   INR 1.1 02/15/2021   INR 1.35 01/12/2018   INR 1.27 11/16/2017    Radiology MM 3D SCREEN BREAST BILATERAL  Result Date: 11/24/2021 CLINICAL DATA:  Screening. EXAM: DIGITAL SCREENING BILATERAL MAMMOGRAM WITH TOMOSYNTHESIS AND CAD TECHNIQUE: Bilateral screening digital craniocaudal and mediolateral oblique mammograms were obtained. Bilateral screening digital breast tomosynthesis was performed. The images were evaluated with computer-aided detection. COMPARISON:  Previous exam(s). ACR Breast Density Category a: The breast tissue is almost entirely fatty. FINDINGS: There are no findings suspicious for malignancy. IMPRESSION: No mammographic evidence of malignancy. A result letter of this screening mammogram will be mailed directly to the patient. RECOMMENDATION: Screening mammogram in one year. (Code:SM-B-01Y) BI-RADS CATEGORY  1: Negative. Electronically Signed   By: Marin Olp M.D.   On: 11/24/2021 09:19     Assessment/Plan 1. ESRD (end stage renal disease) ([place]) Recommend:   At this time the patient does not have appropriate extremity access for dialysis.  Her original wrist fistula is not salvageable  but the imagine from the angiogram shows a good size cephalic vein in the upper arm.   Patient should have a left brachial cephalic fistula created.  Option of a brachial axillary graft ws discussed but this can still be created if the fistula doesn't work so it is reasonable to try the fistula first.   The risks, benefits and alternative therapies were reviewed in detail with the patient.  All questions were answered.  The patient agrees to proceed with surgery.    The patient will follow up with me in the office after the surgery.    2. Atrial fibrillation, chronic (HCC) Continue antiarrhythmia medications as already ordered, these medications have been reviewed and there are no changes at this time.   Continue anticoagulation as ordered by Cardiology Service    3. Essential hypertension Continue antihypertensive medications as already ordered, these medications have been reviewed and there are no changes at this time.    4. Diabetes mellitus type 2 in obese ([place]) Continue hypoglycemic medications as already ordered, these medications have been reviewed and there are no changes at this time.   Hgb A1C to be monitored as already arranged by primary service    5. Dyslipidemia Continue statin as ordered and reviewed, no changes at this time    [name], MD  12/09/2021 7:35 AM

## 2021-12-09 NOTE — Op Note (Signed)
     OPERATIVE NOTE   PROCEDURE: left brachial cephalic arteriovenous fistula placement  PRE-OPERATIVE DIAGNOSIS: End Stage Renal Disease  POST-OPERATIVE DIAGNOSIS: End Stage Renal Disease  SURGEON: [name]  ASSISTANT(S): Annalee Genta  ANESTHESIA: general  ESTIMATED BLOOD LOSS: <50 cc  FINDING(S): 4 mm cephalic vein  SPECIMEN(S):  none  INDICATIONS:   Nicole Schmidt is a 56 y.o. female who presents with end stage renal disease.  The patient is scheduled for left brachiocephalic arteriovenous fistula placement.  The patient is aware the risks include but are not limited to: bleeding, infection, steal syndrome, nerve damage, ischemic monomelic neuropathy, failure to mature, and need for additional procedures.  The patient is aware of the risks of the procedure and elects to proceed forward.  DESCRIPTION: After full informed written consent was obtained from the patient, the patient was brought back to the operating room and placed supine upon the operating table.  Prior to induction, the patient received IV antibiotics.   After obtaining adequate anesthesia, the patient was then prepped and draped in the standard fashion for a left arm access procedure.   A first assistant was required to provide a safe and appropriate environment for executing the surgery.  The assistant was integral in providing retraction, exposure, running suture providing suction and in the closing process.   A curvilinear incision was then created midway between the radial impulse and the cephalic vein. The cephalic vein was then identified and dissected circumferentially. It was marked with a surgical marker.    Attention was then turned to the brachial artery which was exposed through the same incision and looped proximally and distally. Side branches were controlled with 4-0 silk ties.  The distal segment of the vein was ligated with a  2-0 silk, and the vein was transected.  The proximal segment  was interrogated with serial dilators.  The vein accepted up to a 4 mm dilator without any difficulty. Heparinized saline was infused into the vein and clamped it with a small bulldog.  At this point, I reset my exposure of the brachial artery and controlled the artery with vessel loops proximally and distally.  An arteriotomy was then made with a #11 blade, and extended with a Potts scissor.  Heparinized saline was injected proximal and distal into the radial artery.  The vein was then approximated to the artery while the artery was in its native bed and subsequently the vein was beveled using Potts scissors. The vein was then sewn to the artery in an end-to-side configuration with a running stitch of 6-0 Prolene.  Prior to completing this anastomosis Flushing maneuvers were performed and the artery was allowed to forward and back bleed.  There was no evidence of clot from any vessels.  I completed the anastomosis in the usual fashion and then released all vessel loops and clamps.    There was good  thrill in the venous outflow, and there was 1+ palpable radial pulse.  At this point, I irrigated out the surgical wound.  There was no further active bleeding.  The subcutaneous tissue was reapproximated with a running stitch of 3-0 Vicryl.  The skin was then reapproximated with a running subcuticular stitch of 4-0 Vicryl.  The skin was then cleaned, dried, and reinforced with Dermabond.    The patient tolerated this procedure well.   COMPLICATIONS: None  CONDITION: Margaret[facility] D[facility]  Office: [phone]   12/09/2021, 10:27 AM

## 2021-12-09 NOTE — Anesthesia Preprocedure Evaluation (Signed)
Anesthesia Evaluation  Patient identified by MRN, date of birth, ID band Patient awake  General Assessment Comment:  In past anesthetic, patient retaining CO2 upon extubation, needing BiPAP in PACU.  Reviewed: Allergy & Precautions, NPO status , Patient's Chart, lab work & pertinent test results  History of Anesthesia Complications Negative for: history of anesthetic complications  Airway Mallampati: I  TM Distance: >3 FB Neck ROM: Full    Dental  (+) Missing,    Pulmonary sleep apnea and Oxygen sleep apnea , Patient abstained from smoking.Not current smoker,  H/o PE 2016  2L at night  restrictive lung disease    + decreased breath sounds      Cardiovascular Exercise Tolerance: Poor METS: < 3 Mets hypertension, Pt. on medications +CHF  (-) CAD and (-) Past MI + dysrhythmias Atrial Fibrillation  Rhythm:Regular Rate:Normal - Systolic murmurs 1. Left ventricular ejection fraction, by estimation, is 60 to 65%. The  left ventricle has normal function. Left ventricular endocardial border  not optimally defined to evaluate regional wall motion. There is moderate  left ventricular hypertrophy. Left  ventricular diastolic function could not be evaluated.  2. Right ventricular systolic function is normal. The right ventricular  size is mildly enlarged.  3. Left atrial size was moderately dilated.  4. Right atrial size was moderately dilated.  5. The mitral valve is normal in structure. Trivial mitral valve  regurgitation.  6. The aortic valve has an indeterminant number of cusps. Aortic valve  regurgitation is not visualized. No aortic stenosis is present.  7. The inferior vena cava is normal in size with greater than 50%  respiratory variability, suggesting right atrial pressure of 3 mmHg.    Neuro/Psych negative neurological ROS  negative psych ROS   GI/Hepatic neg GERD  ,(+)     (-) substance abuse  ,   Endo/Other   diabetes, Well Controlled, Type 2Hypothyroidism Morbid obesity  Renal/GU ESRF and DialysisRenal diseaseDialyzed yesterday     Musculoskeletal  (+) Arthritis ,   Abdominal (+) + obese,   Peds  Hematology   Anesthesia Other Findings Past Medical History: No date: (HFpEF) heart failure with preserved ejection fraction ([place])     Comment:  a. 10/2018 Echo: EF 60-65%, diast dysfxn, RVSP 50.7 mmHg,              mildly dil LA. 02/09/2020: Acquired hypothyroidism No date: Anemia No date: Arthritis 2014: Breast cancer ([place])     Comment:  right breast cancer No date: Breast cancer of upper-inner quadrant of right female breast  ([place])     Comment:  Right breast invasive CA and DCIS , 7 mm T1,N0,M0. Er/PR              pos, her 2 negative.  Margins 1 mm. No date: CHF (congestive heart failure) (HCC) No date: CKD (chronic kidney disease), stage III ([place]) 02/09/2020: Diabetes mellitus type 2 in obese ([place]) No date: Diabetes mellitus without complication ([place]) 25/42/7062: Dyslipidemia No date: Essential hypertension No date: Gout No date: Hypothyroidism No date: Menopause     Comment:  age 56 No date: Morbid obesity ([place]) No date: Persistent atrial fibrillation ([place])     Comment:  a. Dx 10/2018 in setting of PNA. CHA2DS2VASc =               4-->Eliquis; b. 11/2018 s/p successful DCCV. No date: Personal history of radiation therapy 10/2014: Pulmonary embolism (HCC)     Comment:  a. Chronic eliquis. No date: Thyroid  goiter  Reproductive/Obstetrics                             Anesthesia Physical  Anesthesia Plan  ASA: 4  Anesthesia Plan: General   Post-op Pain Management: Ofirmev IV (intra-op)   Induction: Intravenous and Rapid sequence  PONV Risk Score and Plan: 3 and Ondansetron, Dexamethasone and Treatment may vary due to age or medical condition  Airway Management Planned: Oral ETT and Video Laryngoscope Planned  Additional Equipment:  None  Intra-op Plan:   Post-operative Plan: Extubation in OR  Informed Consent: I have reviewed the patients History and Physical, chart, labs and discussed the procedure including the risks, benefits and alternatives for the proposed anesthesia with the patient or authorized representative who has indicated his/her understanding and acceptance.     Dental advisory given  Plan Discussed with: CRNA and Surgeon  Anesthesia Plan Comments: (Discussed risks of anesthesia with patient, including PONV, sore throat, lip/dental/eye damage. Rare risks discussed as well, such as cardiorespiratory and neurological sequelae, and allergic reactions. Discussed the role of CRNA in patient's perioperative care. Patient understands. Patient informed about increased incidence of above perioperative risk due to high BMI. Patient understands.  Patient has her CPAP her, can use in PACU.)        Anesthesia Quick Evaluation

## 2021-12-09 NOTE — Anesthesia Procedure Notes (Signed)
Procedure Name: Intubation Date/Time: 12/09/2021 7:55 AM  Performed by: Kelton Pillar, CRNAPre-anesthesia Checklist: Patient identified, Emergency Drugs available, Suction available and Patient being monitored Patient Re-evaluated:Patient Re-evaluated prior to induction Oxygen Delivery Method: Circle system utilized Preoxygenation: Pre-oxygenation with 100% oxygen Induction Type: IV induction Ventilation: Mask ventilation without difficulty Laryngoscope Size: McGraph and 3 Grade View: Grade I Tube type: Oral Tube size: 7.0 mm Number of attempts: 1 Airway Equipment and Method: Stylet and Oral airway Placement Confirmation: ETT inserted through vocal cords under direct vision, positive ETCO2, breath sounds checked- equal and bilateral and CO2 detector Secured at: 21 cm Tube secured with: Tape Dental Injury: Teeth and Oropharynx as per pre-operative assessment

## 2021-12-09 NOTE — Addendum Note (Signed)
Addendum  created 12/09/21 1421 by Iran Ouch, MD   Intraprocedure Event edited

## 2021-12-09 NOTE — Anesthesia Postprocedure Evaluation (Signed)
Anesthesia Post Note  Patient: Nicole Schmidt  Procedure(s) Performed: ARTERIOVENOUS (AV) FISTULA CREATION (BRACHIAL CEPHALIC) (Left)  Patient location during evaluation: PACU Anesthesia Type: General Level of consciousness: awake and alert Pain management: pain level controlled Vital Signs Assessment: post-procedure vital signs reviewed and stable Respiratory status: spontaneous breathing, nonlabored ventilation and respiratory function stable Cardiovascular status: blood pressure returned to baseline and stable Postop Assessment: no apparent nausea or vomiting Anesthetic complications: no   No notable events documented.   Last Vitals:  Vitals:   12/09/21 1040 12/09/21 1056  BP: 107/66 (!) 118/48  Pulse: (!) 104 96  Resp: (!) 23 16  Temp: 36.7 C 37 C  SpO2: 93%     Last Pain:  Vitals:   12/09/21 1056  TempSrc: Temporal  PainSc: [name]

## 2021-12-09 NOTE — Discharge Instructions (Signed)

## 2021-12-09 NOTE — H&P (View-Only) (Signed)
MRN : [id number]  Nicole Schmidt is a 56 y.o. (05/17/1965) female who presents with chief complaint of check access.  History of Present Illness:   The patient presents to [facility] for creation of a new dialysis access.     Recent imaging 10/08/2021 showed:  Sclerotic vein there is now occlusion of the arterial anastomosis and first centimeters or 2 of the cephalic vein.  More proximal to this the vein is sclerotic and does not appear that it will ever become a viable option for fistula   The patient denies redness or swelling at the access site. The patient denies fever or chills at home or while on dialysis.   No recent shortening of the patient's walking distance or new symptoms consistent with claudication.  No history of rest pain symptoms. No new ulcers or wounds of the lower extremities have occurred.   The patient denies amaurosis fugax or recent TIA symptoms. There are no recent neurological changes noted. There is no history of DVT, PE or superficial thrombophlebitis. No recent episodes of angina or shortness of breath documented.  Current Meds  Medication Sig   amLODipine (NORVASC) 5 MG tablet Take 1 tablet (5 mg total) by mouth daily.   apixaban (ELIQUIS) 5 MG TABS tablet Take 1 tablet (5 mg total) by mouth 2 (two) times daily.   cloNIDine (CATAPRES) 0.2 MG tablet Take 0.2 mg by mouth 2 (two) times daily.   CRANBERRY PO Take 1 capsule by mouth daily.   ergocalciferol (VITAMIN D2) 1.25 MG (50000 UT) capsule Take 1 capsule (50,000 Units total) by mouth once a week.   ezetimibe (ZETIA) 10 MG tablet Take 10 mg by mouth daily.   ferric gluconate 250 mg in sodium chloride 0.9 % 250 mL Inject 250 mg into the vein Every Tuesday,Thursday,and Saturday with dialysis.   folic acid (FOLVITE) 1 MG tablet Take 1 tablet (1 mg total) by mouth daily.   gabapentin (NEURONTIN) 300 MG capsule Take 1 capsule (300 mg total) by mouth at bedtime.   Lancets (ONETOUCH DELICA PLUS  [id number]) MISC    levothyroxine (SYNTHROID) 112 MCG tablet Take 1 tablet (112 mcg total) by mouth daily before breakfast.   melatonin 5 MG TABS Take 2 tablets (10 mg total) by mouth at bedtime as needed (sleep).   metoprolol succinate (TOPROL-XL) 25 MG 24 hr tablet Take 1 tablet (25 mg total) by mouth daily. Take with or immediately following a meal.   ONETOUCH ULTRA test strip    sevelamer carbonate (RENVELA) 800 MG tablet Take 1 tablet (800 mg total) by mouth 3 (three) times daily with meals.   traMADol (ULTRAM) 50 MG tablet Take 1 tablet (50 mg total) by mouth every 12 (twelve) hours as needed for moderate pain.    Past Medical History:  Diagnosis Date   (HFpEF) heart failure with preserved ejection fraction ([place])    a. 10/2018 Echo: EF 60-65%, diast dysfxn, RVSP 50.7 mmHg; b. 01/2020 Echo: EF 65-70%, G2DD; c. 01/2021 Echo: EF 60-65%, no rwma, mod LVH. Nl RV fxn, mildly enlarged RV. Mod BAE. Triv MR. d. 05/2021 Echo: EF 55-60; no RWMAs, mild LVH, mild TR.   Acquired hypothyroidism 02/09/2020   Adenoma of left adrenal gland    Anemia    Aortic atherosclerosis (HCC)    Arthritis    Breast cancer of upper-inner quadrant of right  female breast ([place])    Right breast invasive CA and DCIS , 7 mm T1,N0,M0. Er/PR pos, her 2 negative.  Margins 1 mm.   COPD (chronic obstructive pulmonary disease) (HCC)    Diabetes mellitus type 2 in obese ([place]) 02/09/2020   Difficult intubation 09/28/2021   a.) mallampati III-IV; BMI >55 kg/m   Diverticulosis    Dyslipidemia 02/09/2020   ESBL (extended spectrum beta-lactamase) producing bacteria infection 03/20/2021   a. urine culture (+) for ESBL producing E.coli and P. mirabilis   ESRD (end stage renal disease) on dialysis ([place])    a. T-Th-Sat HD initiated 02/2021   Essential hypertension    Gout    History of kidney stones    Long term current use of anticoagulant    a.) apixaban   Menopause    age 15   Morbid obesity ([place])    OSA treated with BiPAP     a. on nocturnal PAP therapy; Trilogy 100 ventilator   Permanent atrial fibrillation ([place])    a. Dx 10/2018 in setting of PNA. b. CHA2DS2-VASc = 7 (sex, HFpEF, HTN, PE x2, aortic plaque, T2DM); c. s/p successful DCCV 11/2018. d. subsequent recurrent Afib; rate/rhythm maintained on oral metoprolol succinate; chronically anticoagulated using standard dose apixaban.   Personal history of radiation therapy    Pulmonary embolism ([place]) 10/2014   a. on chronic apixaban   Restrictive lung disease    Shingles 10/24/2021   Sleep difficulties    a.) takes melatonin   Thyroid goiter     Past Surgical History:  Procedure Laterality Date   A/V FISTULAGRAM Left 09/02/2021   Procedure: A/V Fistulagram;  Surgeon: Katha Cabal, MD;  Location: [place] [facility];  Service: Cardiovascular;  Laterality: Left;   AV FISTULA PLACEMENT Left 07/22/2021   Procedure: ARTERIOVENOUS (AV) FISTULA CREATION (RADIALCEPHALIC);  Surgeon: Katha Cabal, MD;  Location: [facility];  Service: Vascular;  Laterality: Left;   BREAST BIOPSY Right 2014   breast ca   BREAST EXCISIONAL BIOPSY Right 07/19/2012   breast ca   BREAST SURGERY Right 2014   with sentinel node bx subareaolar duct excision   CARDIOVERSION N/A 11/28/2018   Procedure: CARDIOVERSION;  Surgeon: Minna Merritts, MD;  Location: [facility];  Service: Cardiovascular;  Laterality: N/A;   COLONOSCOPY WITH PROPOFOL N/A 01/30/2017   Procedure: COLONOSCOPY WITH PROPOFOL;  Surgeon: Christene Lye, MD;  Location: [facility];  Service: Endoscopy;  Laterality: N/A;   CYSTOSCOPY W/ RETROGRADES  06/17/2021   Procedure: CYSTOSCOPY WITH RETROGRADE PYELOGRAM;  Surgeon: Billey Co, MD;  Location: [facility];  Service: Urology;;   CYSTOSCOPY W/ URETERAL STENT PLACEMENT Left 02/14/2021   Procedure: CYSTOSCOPY WITH RETROGRADE PYELOGRAM/URETERAL STENT PLACEMENT;  Surgeon: Billey Co, MD;  Location: [facility];  Service: Urology;  Laterality: Left;    CYSTOSCOPY/URETEROSCOPY/HOLMIUM LASER/STENT PLACEMENT Left 06/17/2021   Procedure: CYSTOSCOPY/URETEROSCOPY/HOLMIUM LASER/STENT EXCHANGE;  Surgeon: Billey Co, MD;  Location: [facility];  Service: Urology;  Laterality: Left;   DIALYSIS/PERMA CATHETER INSERTION N/A 02/24/2021   Procedure: DIALYSIS/PERMA CATHETER INSERTION;  Surgeon: Algernon Huxley, MD;  Location: [place] [facility];  Service: Cardiovascular;  Laterality: N/A;   PERIPHERAL VASCULAR THROMBECTOMY Left 09/28/2021   Procedure: PERIPHERAL VASCULAR THROMBECTOMY;  Surgeon: Katha Cabal, MD;  Location: [place] [facility];  Service: Cardiovascular;  Laterality: Left;    Social History Social History   Tobacco Use   Smoking status: Never    Passive exposure: Never   Smokeless tobacco:  Never  Vaping Use   Vaping Use: Never used  Substance Use Topics   Alcohol use: No    Alcohol/week: 0.0 standard drinks of alcohol   Drug use: No    Family History Family History  Problem Relation Age of Onset   Diabetes Father    Hypertension Father    Parkinson's disease Father    Hypertension Mother    Rheum arthritis Mother    Atrial fibrillation Mother    Heart failure Mother    Heart attack Mother        died in her mid-70's.   Colon cancer Cousin 54   Breast cancer Neg Hx     Allergies  Allergen Reactions   Spironolactone     Hyperkalemia  Pt unaware of this allergy     REVIEW OF SYSTEMS (Negative unless checked)  Constitutional: '[]'$ Weight loss  '[]'$ Fever  '[]'$ Chills Cardiac: '[]'$ Chest pain   '[]'$ Chest pressure   '[]'$ Palpitations   '[]'$ Shortness of breath when laying flat   '[]'$ Shortness of breath with exertion. Vascular:  '[]'$ Pain in legs with walking   '[]'$ Pain in legs at rest  '[]'$ History of DVT   '[]'$ Phlebitis   '[]'$ Swelling in legs   '[]'$ Varicose veins   '[]'$ Non-healing ulcers Pulmonary:   '[]'$ Uses home oxygen   '[]'$ Productive cough   '[]'$ Hemoptysis   '[]'$ Wheeze  '[]'$ COPD   '[]'$ Asthma Neurologic:  '[]'$ Dizziness   '[]'$ Seizures   '[]'$ History of stroke    '[]'$ History of TIA  '[]'$ Aphasia   '[]'$ Vissual changes   '[]'$ Weakness or numbness in arm   '[]'$ Weakness or numbness in leg Musculoskeletal:   '[]'$ Joint swelling   '[]'$ Joint pain   '[]'$ Low back pain Hematologic:  '[]'$ Easy bruising  '[]'$ Easy bleeding   '[]'$ Hypercoagulable state   '[]'$ Anemic Gastrointestinal:  '[]'$ Diarrhea   '[]'$ Vomiting  '[]'$ Gastroesophageal reflux/heartburn   '[]'$ Difficulty swallowing. Genitourinary:  '[x]'$ Chronic kidney disease   '[]'$ Difficult urination  '[]'$ Frequent urination   '[]'$ Blood in urine Skin:  '[]'$ Rashes   '[]'$ Ulcers  Psychological:  '[]'$ History of anxiety   '[]'$  History of major depression.  Physical Examination  Vitals:   12/09/21 0626  BP: 120/81  Pulse: 91  Resp: 16  Temp: (!) 97.2 F (36.2 C)  TempSrc: Temporal  SpO2: 98%  Weight: (!) 155.6 kg  Height: '5\' 4"'$  (1.626 m)   Body mass index is 58.88 kg/m. Gen: WD/WN, NAD Head: [place]/AT, No temporalis wasting.  Ear/Nose/Throat: Hearing grossly intact, nares w/o erythema or drainage Eyes: PER, EOMI, sclera nonicteric.  Neck: Supple, no gross masses or lesions.  No JVD.  Pulmonary:  Good air movement, no audible wheezing, no use of accessory muscles.  Cardiac: RRR, precordium non-hyperdynamic. Vascular:    Vessel Right Left  Radial Palpable Palpable  Brachial Palpable Palpable  Gastrointestinal: soft, non-distended. No guarding/no peritoneal signs.  Musculoskeletal: M/S 5/5 throughout.  No deformity.  Neurologic: CN 2-12 intact. Pain and light touch intact in extremities.  Symmetrical.  Speech is fluent. Motor exam as listed above. Psychiatric: Judgment intact, Mood & affect appropriate for pt's clinical situation. Dermatologic: No rashes or ulcers noted.  No changes consistent with cellulitis.   CBC Lab Results  Component Value Date   WBC 10.1 10/24/2021   HGB 12.6 12/09/2021   HCT 37.0 12/09/2021   MCV 106.9 (H) 10/24/2021   PLT 179 10/24/2021    BMET    Component Value Date/Time   NA 136 12/09/2021 0623   NA 144 02/27/2020 0931   NA  141 03/09/2014 1533   K 4.1 12/09/2021 0623   K  4.2 03/09/2014 1533   CL 100 12/09/2021 0623   CL 101 03/09/2014 1533   CO2 24 10/24/2021 1402   CO2 30 03/09/2014 1533   GLUCOSE 146 (H) 12/09/2021 0623   GLUCOSE 95 03/09/2014 1533   BUN 33 (H) 12/09/2021 0623   BUN 40 (H) 02/27/2020 0931   BUN 35 (H) 03/09/2014 1533   CREATININE 7.00 (H) 12/09/2021 0623   CREATININE 1.50 (H) 03/09/2014 1533   CALCIUM 9.3 10/24/2021 1402   CALCIUM 8.8 03/09/2014 1533   GFRNONAA 5 (L) 10/24/2021 1402   GFRNONAA 39 (L) 03/09/2014 1533   GFRNONAA 55 (L) 07/31/2013 1521   GFRAA 35 (L) 02/27/2020 0931   GFRAA 47 (L) 03/09/2014 1533   GFRAA >60 07/31/2013 1521   Estimated Creatinine Clearance: 13.2 mL/min (A) (by C-G formula based on SCr of 7 mg/dL (H)).  COAG Lab Results  Component Value Date   INR 1.1 02/15/2021   INR 1.35 01/12/2018   INR 1.27 11/16/2017    Radiology MM 3D SCREEN BREAST BILATERAL  Result Date: 11/24/2021 CLINICAL DATA:  Screening. EXAM: DIGITAL SCREENING BILATERAL MAMMOGRAM WITH TOMOSYNTHESIS AND CAD TECHNIQUE: Bilateral screening digital craniocaudal and mediolateral oblique mammograms were obtained. Bilateral screening digital breast tomosynthesis was performed. The images were evaluated with computer-aided detection. COMPARISON:  Previous exam(s). ACR Breast Density Category a: The breast tissue is almost entirely fatty. FINDINGS: There are no findings suspicious for malignancy. IMPRESSION: No mammographic evidence of malignancy. A result letter of this screening mammogram will be mailed directly to the patient. RECOMMENDATION: Screening mammogram in one year. (Code:SM-B-01Y) BI-RADS CATEGORY  1: Negative. Electronically Signed   By: Marin Olp M.D.   On: 11/24/2021 09:19     Assessment/Plan 1. ESRD (end stage renal disease) ([place]) Recommend:   At this time the patient does not have appropriate extremity access for dialysis.  Her original wrist fistula is not salvageable  but the imagine from the angiogram shows a good size cephalic vein in the upper arm.   Patient should have a left brachial cephalic fistula created.  Option of a brachial axillary graft ws discussed but this can still be created if the fistula doesn't work so it is reasonable to try the fistula first.   The risks, benefits and alternative therapies were reviewed in detail with the patient.  All questions were answered.  The patient agrees to proceed with surgery.    The patient will follow up with me in the office after the surgery.    2. Atrial fibrillation, chronic (HCC) Continue antiarrhythmia medications as already ordered, these medications have been reviewed and there are no changes at this time.   Continue anticoagulation as ordered by Cardiology Service    3. Essential hypertension Continue antihypertensive medications as already ordered, these medications have been reviewed and there are no changes at this time.    4. Diabetes mellitus type 2 in obese ([place]) Continue hypoglycemic medications as already ordered, these medications have been reviewed and there are no changes at this time.   Hgb A1C to be monitored as already arranged by primary service    5. Dyslipidemia Continue statin as ordered and reviewed, no changes at this time    [name], MD  12/09/2021 7:35 AM

## 2021-12-09 NOTE — Interval H&P Note (Signed)
History and Physical Interval Note:  12/09/2021 7:37 AM  Nicole Schmidt  has presented today for surgery, with the diagnosis of ESRD.  The various methods of treatment have been discussed with the patient and family. After consideration of risks, benefits and other options for treatment, the patient has consented to  Procedure(s): ARTERIOVENOUS (AV) FISTULA CREATION (BRACHIAL CEPHALIC) (Left) as a surgical intervention.  The patient's history has been reviewed, patient examined, no change in status, stable for surgery.  I have reviewed the patient's chart and labs.  Questions were answered to the patient's satisfaction.     [name]

## 2021-12-09 NOTE — Transfer of Care (Signed)
Immediate Anesthesia Transfer of Care Note  Patient: Nicole Schmidt  Procedure(s) Performed: ARTERIOVENOUS (AV) FISTULA CREATION (BRACHIAL CEPHALIC) (Left)  Patient Location: PACU  Anesthesia Type:General  Level of Consciousness: awake, drowsy and patient cooperative  Airway & Oxygen Therapy: Patient Spontanous Breathing and Patient connected to face mask oxygen  Post-op Assessment: Report given to RN and Post -op Vital signs reviewed and stable  Post vital signs: Reviewed and stable  Last Vitals:  Vitals Value Taken Time  BP    Temp    Pulse 107 12/09/21 1020  Resp 17 12/09/21 1020  SpO2 98 % 12/09/21 1020  Vitals shown include unvalidated device data.  Last Pain:  Vitals:   12/09/21 1015  TempSrc:   PainSc: 0-No pain         Complications: No notable events documented.

## 2021-12-09 NOTE — Progress Notes (Signed)
MRN : [id number]  Nicole Schmidt is a 56 y.o. ([birth year]) female who presents with chief complaint of check access.  History of Present Illness:   The patient presents to [facility] for creation of a new dialysis access.     Recent imaging 10/08/2021 showed:  Sclerotic vein there is now occlusion of the arterial anastomosis and first centimeters or 2 of the cephalic vein.  More proximal to this the vein is sclerotic and does not appear that it will ever become a viable option for fistula   The patient denies redness or swelling at the access site. The patient denies fever or chills at home or while on dialysis.   No recent shortening of the patient's walking distance or new symptoms consistent with claudication.  No history of rest pain symptoms. No new ulcers or wounds of the lower extremities have occurred.   The patient denies amaurosis fugax or recent TIA symptoms. There are no recent neurological changes noted. There is no history of DVT, PE or superficial thrombophlebitis. No recent episodes of angina or shortness of breath documented.  Current Meds  Medication Sig   amLODipine (NORVASC) 5 MG tablet Take 1 tablet (5 mg total) by mouth daily.   apixaban (ELIQUIS) 5 MG TABS tablet Take 1 tablet (5 mg total) by mouth 2 (two) times daily.   cloNIDine (CATAPRES) 0.2 MG tablet Take 0.2 mg by mouth 2 (two) times daily.   CRANBERRY PO Take 1 capsule by mouth daily.   ergocalciferol (VITAMIN D2) 1.25 MG (50000 UT) capsule Take 1 capsule (50,000 Units total) by mouth once a week.   ezetimibe (ZETIA) 10 MG tablet Take 10 mg by mouth daily.   ferric gluconate 250 mg in sodium chloride 0.9 % 250 mL Inject 250 mg into the vein Every Tuesday,Thursday,and Saturday with dialysis.   folic acid (FOLVITE) 1 MG tablet Take 1 tablet (1 mg total) by mouth daily.   gabapentin (NEURONTIN) 300 MG capsule Take 1 capsule (300 mg total) by mouth at bedtime.   Lancets (ONETOUCH DELICA PLUS  [id number]) MISC    levothyroxine (SYNTHROID) 112 MCG tablet Take 1 tablet (112 mcg total) by mouth daily before breakfast.   melatonin 5 MG TABS Take 2 tablets (10 mg total) by mouth at bedtime as needed (sleep).   metoprolol succinate (TOPROL-XL) 25 MG 24 hr tablet Take 1 tablet (25 mg total) by mouth daily. Take with or immediately following a meal.   ONETOUCH ULTRA test strip    sevelamer carbonate (RENVELA) 800 MG tablet Take 1 tablet (800 mg total) by mouth 3 (three) times daily with meals.   traMADol (ULTRAM) 50 MG tablet Take 1 tablet (50 mg total) by mouth every 12 (twelve) hours as needed for moderate pain.    Past Medical History:  Diagnosis Date   (HFpEF) heart failure with preserved ejection fraction ([place])    a. 10/2018 Echo: EF 60-65%, diast dysfxn, RVSP 50.7 mmHg; b. 01/2020 Echo: EF 65-70%, G2DD; c. 01/2021 Echo: EF 60-65%, no rwma, mod LVH. Nl RV fxn, mildly enlarged RV. Mod BAE. Triv MR. d. 05/2021 Echo: EF 55-60; no RWMAs, mild LVH, mild TR.   Acquired hypothyroidism 02/09/2020   Adenoma of left adrenal gland    Anemia    Aortic atherosclerosis (HCC)    Arthritis    Breast cancer of upper-inner quadrant of right  female breast ([place])    Right breast invasive CA and DCIS , 7 mm T1,N0,M0. Er/PR pos, her 2 negative.  Margins 1 mm.   COPD (chronic obstructive pulmonary disease) (HCC)    Diabetes mellitus type 2 in obese ([place]) 02/09/2020   Difficult intubation 09/28/2021   a.) mallampati III-IV; BMI >55 kg/m   Diverticulosis    Dyslipidemia 02/09/2020   ESBL (extended spectrum beta-lactamase) producing bacteria infection 03/20/2021   a. urine culture (+) for ESBL producing E.coli and P. mirabilis   ESRD (end stage renal disease) on dialysis ([place])    a. T-Th-Sat HD initiated 02/2021   Essential hypertension    Gout    History of kidney stones    Long term current use of anticoagulant    a.) apixaban   Menopause    age 8   Morbid obesity ([place])    OSA treated with BiPAP     a. on nocturnal PAP therapy; Trilogy 100 ventilator   Permanent atrial fibrillation ([place])    a. Dx 10/2018 in setting of PNA. b. CHA2DS2-VASc = 7 (sex, HFpEF, HTN, PE x2, aortic plaque, T2DM); c. s/p successful DCCV 11/2018. d. subsequent recurrent Afib; rate/rhythm maintained on oral metoprolol succinate; chronically anticoagulated using standard dose apixaban.   Personal history of radiation therapy    Pulmonary embolism ([place]) 10/2014   a. on chronic apixaban   Restrictive lung disease    Shingles 10/24/2021   Sleep difficulties    a.) takes melatonin   Thyroid goiter     Past Surgical History:  Procedure Laterality Date   A/V FISTULAGRAM Left 09/02/2021   Procedure: A/V Fistulagram;  Surgeon: Katha Cabal, MD;  Location: [place] [facility];  Service: Cardiovascular;  Laterality: Left;   AV FISTULA PLACEMENT Left 07/22/2021   Procedure: ARTERIOVENOUS (AV) FISTULA CREATION (RADIALCEPHALIC);  Surgeon: Katha Cabal, MD;  Location: [facility];  Service: Vascular;  Laterality: Left;   BREAST BIOPSY Right 2014   breast ca   BREAST EXCISIONAL BIOPSY Right 07/19/2012   breast ca   BREAST SURGERY Right 2014   with sentinel node bx subareaolar duct excision   CARDIOVERSION N/A 11/28/2018   Procedure: CARDIOVERSION;  Surgeon: Minna Merritts, MD;  Location: [facility];  Service: Cardiovascular;  Laterality: N/A;   COLONOSCOPY WITH PROPOFOL N/A 01/30/2017   Procedure: COLONOSCOPY WITH PROPOFOL;  Surgeon: Christene Lye, MD;  Location: [facility];  Service: Endoscopy;  Laterality: N/A;   CYSTOSCOPY W/ RETROGRADES  06/17/2021   Procedure: CYSTOSCOPY WITH RETROGRADE PYELOGRAM;  Surgeon: Billey Co, MD;  Location: [facility];  Service: Urology;;   CYSTOSCOPY W/ URETERAL STENT PLACEMENT Left 02/14/2021   Procedure: CYSTOSCOPY WITH RETROGRADE PYELOGRAM/URETERAL STENT PLACEMENT;  Surgeon: Billey Co, MD;  Location: [facility];  Service: Urology;  Laterality: Left;    CYSTOSCOPY/URETEROSCOPY/HOLMIUM LASER/STENT PLACEMENT Left 06/17/2021   Procedure: CYSTOSCOPY/URETEROSCOPY/HOLMIUM LASER/STENT EXCHANGE;  Surgeon: Billey Co, MD;  Location: [facility];  Service: Urology;  Laterality: Left;   DIALYSIS/PERMA CATHETER INSERTION N/A 02/24/2021   Procedure: DIALYSIS/PERMA CATHETER INSERTION;  Surgeon: Algernon Huxley, MD;  Location: [place] [facility];  Service: Cardiovascular;  Laterality: N/A;   PERIPHERAL VASCULAR THROMBECTOMY Left 09/28/2021   Procedure: PERIPHERAL VASCULAR THROMBECTOMY;  Surgeon: Katha Cabal, MD;  Location: [place] [facility];  Service: Cardiovascular;  Laterality: Left;    Social History Social History   Tobacco Use   Smoking status: Never    Passive exposure: Never   Smokeless tobacco:  Never  Vaping Use   Vaping Use: Never used  Substance Use Topics   Alcohol use: No    Alcohol/week: 0.0 standard drinks of alcohol   Drug use: No    Family History Family History  Problem Relation Age of Onset   Diabetes Father    Hypertension Father    Parkinson's disease Father    Hypertension Mother    Rheum arthritis Mother    Atrial fibrillation Mother    Heart failure Mother    Heart attack Mother        died in her mid-70's.   Colon cancer Cousin 54   Breast cancer Neg Hx     Allergies  Allergen Reactions   Spironolactone     Hyperkalemia  Pt unaware of this allergy     REVIEW OF SYSTEMS (Negative unless checked)  Constitutional: '[]'$ Weight loss  '[]'$ Fever  '[]'$ Chills Cardiac: '[]'$ Chest pain   '[]'$ Chest pressure   '[]'$ Palpitations   '[]'$ Shortness of breath when laying flat   '[]'$ Shortness of breath with exertion. Vascular:  '[]'$ Pain in legs with walking   '[]'$ Pain in legs at rest  '[]'$ History of DVT   '[]'$ Phlebitis   '[]'$ Swelling in legs   '[]'$ Varicose veins   '[]'$ Non-healing ulcers Pulmonary:   '[]'$ Uses home oxygen   '[]'$ Productive cough   '[]'$ Hemoptysis   '[]'$ Wheeze  '[]'$ COPD   '[]'$ Asthma Neurologic:  '[]'$ Dizziness   '[]'$ Seizures   '[]'$ History of stroke    '[]'$ History of TIA  '[]'$ Aphasia   '[]'$ Vissual changes   '[]'$ Weakness or numbness in arm   '[]'$ Weakness or numbness in leg Musculoskeletal:   '[]'$ Joint swelling   '[]'$ Joint pain   '[]'$ Low back pain Hematologic:  '[]'$ Easy bruising  '[]'$ Easy bleeding   '[]'$ Hypercoagulable state   '[]'$ Anemic Gastrointestinal:  '[]'$ Diarrhea   '[]'$ Vomiting  '[]'$ Gastroesophageal reflux/heartburn   '[]'$ Difficulty swallowing. Genitourinary:  '[x]'$ Chronic kidney disease   '[]'$ Difficult urination  '[]'$ Frequent urination   '[]'$ Blood in urine Skin:  '[]'$ Rashes   '[]'$ Ulcers  Psychological:  '[]'$ History of anxiety   '[]'$  History of major depression.  Physical Examination  Vitals:   12/09/21 0626  BP: 120/81  Pulse: 91  Resp: 16  Temp: (!) 97.2 F (36.2 C)  TempSrc: Temporal  SpO2: 98%  Weight: (!) 155.6 kg  Height: '5\' 4"'$  (1.626 m)   Body mass index is 58.88 kg/m. Gen: WD/WN, NAD Head: [place]/AT, No temporalis wasting.  Ear/Nose/Throat: Hearing grossly intact, nares w/o erythema or drainage Eyes: PER, EOMI, sclera nonicteric.  Neck: Supple, no gross masses or lesions.  No JVD.  Pulmonary:  Good air movement, no audible wheezing, no use of accessory muscles.  Cardiac: RRR, precordium non-hyperdynamic. Vascular:    Vessel Right Left  Radial Palpable Palpable  Brachial Palpable Palpable  Gastrointestinal: soft, non-distended. No guarding/no peritoneal signs.  Musculoskeletal: M/S 5/5 throughout.  No deformity.  Neurologic: CN 2-12 intact. Pain and light touch intact in extremities.  Symmetrical.  Speech is fluent. Motor exam as listed above. Psychiatric: Judgment intact, Mood & affect appropriate for pt's clinical situation. Dermatologic: No rashes or ulcers noted.  No changes consistent with cellulitis.   CBC Lab Results  Component Value Date   WBC 10.1 10/24/2021   HGB 12.6 12/09/2021   HCT 37.0 12/09/2021   MCV 106.9 (H) 10/24/2021   PLT 179 10/24/2021    BMET    Component Value Date/Time   NA 136 12/09/2021 0623   NA 144 02/27/2020 0931   NA  141 03/09/2014 1533   K 4.1 12/09/2021 0623   K  4.2 03/09/2014 1533   CL 100 12/09/2021 0623   CL 101 03/09/2014 1533   CO2 24 10/24/2021 1402   CO2 30 03/09/2014 1533   GLUCOSE 146 (H) 12/09/2021 0623   GLUCOSE 95 03/09/2014 1533   BUN 33 (H) 12/09/2021 0623   BUN 40 (H) 02/27/2020 0931   BUN 35 (H) 03/09/2014 1533   CREATININE 7.00 (H) 12/09/2021 0623   CREATININE 1.50 (H) 03/09/2014 1533   CALCIUM 9.3 10/24/2021 1402   CALCIUM 8.8 03/09/2014 1533   GFRNONAA 5 (L) 10/24/2021 1402   GFRNONAA 39 (L) 03/09/2014 1533   GFRNONAA 55 (L) 07/31/2013 1521   GFRAA 35 (L) 02/27/2020 0931   GFRAA 47 (L) 03/09/2014 1533   GFRAA >60 07/31/2013 1521   Estimated Creatinine Clearance: 13.2 mL/min (A) (by C-G formula based on SCr of 7 mg/dL (H)).  COAG Lab Results  Component Value Date   INR 1.1 02/15/2021   INR 1.35 01/12/2018   INR 1.27 11/16/2017    Radiology MM 3D SCREEN BREAST BILATERAL  Result Date: 11/24/2021 CLINICAL DATA:  Screening. EXAM: DIGITAL SCREENING BILATERAL MAMMOGRAM WITH TOMOSYNTHESIS AND CAD TECHNIQUE: Bilateral screening digital craniocaudal and mediolateral oblique mammograms were obtained. Bilateral screening digital breast tomosynthesis was performed. The images were evaluated with computer-aided detection. COMPARISON:  Previous exam(s). ACR Breast Density Category a: The breast tissue is almost entirely fatty. FINDINGS: There are no findings suspicious for malignancy. IMPRESSION: No mammographic evidence of malignancy. A result letter of this screening mammogram will be mailed directly to the patient. RECOMMENDATION: Screening mammogram in one year. (Code:[id number]-[id number]) BI-RADS CATEGORY  1: Negative. Electronically Signed   By: Marin Olp M.D.   On: 11/24/2021 09:19     Assessment/Plan 1. ESRD (end stage renal disease) ([place]) Recommend:   At this time the patient does not have appropriate extremity access for dialysis.  Her original wrist fistula is not salvageable  but the imagine from the angiogram shows a good size cephalic vein in the upper arm.   Patient should have a left brachial cephalic fistula created.  Option of a brachial axillary graft ws discussed but this can still be created if the fistula doesn't work so it is reasonable to try the fistula first.   The risks, benefits and alternative therapies were reviewed in detail with the patient.  All questions were answered.  The patient agrees to proceed with surgery.    The patient will follow up with me in the office after the surgery.    2. Atrial fibrillation, chronic (HCC) Continue antiarrhythmia medications as already ordered, these medications have been reviewed and there are no changes at this time.   Continue anticoagulation as ordered by Cardiology Service    3. Essential hypertension Continue antihypertensive medications as already ordered, these medications have been reviewed and there are no changes at this time.    4. Diabetes mellitus type 2 in obese ([place]) Continue hypoglycemic medications as already ordered, these medications have been reviewed and there are no changes at this time.   Hgb A1C to be monitored as already arranged by primary service    5. Dyslipidemia Continue statin as ordered and reviewed, no changes at this time    [name], MD  12/09/2021 7:35 AM

## 2021-12-10 ENCOUNTER — Encounter: Payer: Self-pay | Admitting: Vascular Surgery

## 2021-12-12 ENCOUNTER — Encounter (INDEPENDENT_AMBULATORY_CARE_PROVIDER_SITE_OTHER): Payer: Self-pay

## 2021-12-12 NOTE — Assessment & Plan Note (Addendum)
-   Patient is compliant with noninvasive ventilator. Average usage 4 hours 35 mins.  - Min EPAP 4; Max EPAP 15cm h20; PS 6-22. Airleaks median 0.8L/min. AHI 0.6 - Continue to encourage patient wear trilogy at bedtime and with naps

## 2021-12-12 NOTE — Assessment & Plan Note (Signed)
-   Continue 2 L supplemental oxygen as needed with exertion to maintain O2 greater than 88 to 90% and at bedtime

## 2021-12-12 NOTE — Assessment & Plan Note (Deleted)
-   Continues hemodialysis Tuesday, Thursday and Saturday.  Planning for fistula placement this Friday.

## 2021-12-12 NOTE — Assessment & Plan Note (Addendum)
-   Continues hemodialysis Tuesday, Thursday and Saturday.  Planning for AV fistula placement 12/09/21

## 2021-12-16 ENCOUNTER — Encounter: Payer: Self-pay | Admitting: Cardiovascular Disease

## 2021-12-16 ENCOUNTER — Encounter (INDEPENDENT_AMBULATORY_CARE_PROVIDER_SITE_OTHER): Payer: BC Managed Care – PPO

## 2021-12-16 ENCOUNTER — Other Ambulatory Visit: Payer: Self-pay

## 2021-12-16 ENCOUNTER — Encounter: Payer: Self-pay | Admitting: Emergency Medicine

## 2021-12-16 ENCOUNTER — Encounter (INDEPENDENT_AMBULATORY_CARE_PROVIDER_SITE_OTHER): Payer: Self-pay

## 2021-12-16 ENCOUNTER — Encounter
Admission: EM | Disposition: A | Discharge status: Home / Self Care | Payer: Self-pay | Source: Home / Self Care | Attending: Student in an Organized Health Care Education/Training Program

## 2021-12-16 ENCOUNTER — Telehealth (INDEPENDENT_AMBULATORY_CARE_PROVIDER_SITE_OTHER): Payer: Self-pay

## 2021-12-16 ENCOUNTER — Emergency Department
Admission: EM | Admit: 2021-12-16 | Discharge: 2021-12-20 | Disposition: A | Discharge status: Home / Self Care | Payer: BC Managed Care – PPO | Attending: Emergency Medicine | Admitting: Emergency Medicine

## 2021-12-16 DIAGNOSIS — I120 Hypertensive chronic kidney disease with stage 5 chronic kidney disease or end stage renal disease: Secondary | ICD-10-CM | POA: Diagnosis not present

## 2021-12-16 DIAGNOSIS — Y841 Kidney dialysis as the cause of abnormal reaction of the patient, or of later complication, without mention of misadventure at the time of the procedure: Secondary | ICD-10-CM | POA: Diagnosis not present

## 2021-12-16 DIAGNOSIS — T8249XA Other complication of vascular dialysis catheter, initial encounter: Secondary | ICD-10-CM | POA: Diagnosis not present

## 2021-12-16 DIAGNOSIS — E1122 Type 2 diabetes mellitus with diabetic chronic kidney disease: Secondary | ICD-10-CM | POA: Diagnosis not present

## 2021-12-16 DIAGNOSIS — Z992 Dependence on renal dialysis: Secondary | ICD-10-CM

## 2021-12-16 DIAGNOSIS — N186 End stage renal disease: Secondary | ICD-10-CM | POA: Diagnosis not present

## 2021-12-16 DIAGNOSIS — T82838A Hemorrhage of vascular prosthetic devices, implants and grafts, initial encounter: Secondary | ICD-10-CM | POA: Insufficient documentation

## 2021-12-16 DIAGNOSIS — I132 Hypertensive heart and chronic kidney disease with heart failure and with stage 5 chronic kidney disease, or end stage renal disease: Secondary | ICD-10-CM | POA: Diagnosis not present

## 2021-12-16 DIAGNOSIS — I482 Chronic atrial fibrillation, unspecified: Secondary | ICD-10-CM | POA: Diagnosis not present

## 2021-12-16 DIAGNOSIS — D649 Anemia, unspecified: Secondary | ICD-10-CM | POA: Diagnosis not present

## 2021-12-16 DIAGNOSIS — Z7901 Long term (current) use of anticoagulants: Secondary | ICD-10-CM | POA: Diagnosis not present

## 2021-12-16 DIAGNOSIS — E785 Hyperlipidemia, unspecified: Secondary | ICD-10-CM | POA: Diagnosis not present

## 2021-12-16 DIAGNOSIS — I509 Heart failure, unspecified: Secondary | ICD-10-CM | POA: Diagnosis not present

## 2021-12-16 DIAGNOSIS — Z789 Other specified health status: Secondary | ICD-10-CM

## 2021-12-16 HISTORY — PX: DIALYSIS/PERMA CATHETER INSERTION: CATH118288

## 2021-12-16 LAB — BASIC METABOLIC PANEL
Anion gap: 13 (ref 5–15)
BUN: 62 mg/dL — ABNORMAL HIGH (ref 6–20)
CO2: 28 mmol/L (ref 22–32)
Calcium: 9 mg/dL (ref 8.9–10.3)
Chloride: 98 mmol/L (ref 98–111)
Creatinine, Ser: 8.99 mg/dL — ABNORMAL HIGH (ref 0.44–1.00)
GFR, Estimated: 5 mL/min — ABNORMAL LOW (ref >60)
Glucose, Bld: 140 mg/dL — ABNORMAL HIGH (ref 70–99)
Potassium: 5 mmol/L (ref 3.5–5.1)
Sodium: 139 mmol/L (ref 135–145)

## 2021-12-16 LAB — CBC WITH DIFFERENTIAL/PLATELET
Abs Immature Granulocytes: 0.04 10*3/uL (ref 0.00–0.07)
Basophils Absolute: 0 10*3/uL (ref 0.0–0.1)
Basophils Relative: 0 %
Eosinophils Absolute: 0.3 10*3/uL (ref 0.0–0.5)
Eosinophils Relative: 4 %
HCT: 34.7 % — ABNORMAL LOW (ref 36.0–46.0)
Hemoglobin: 10.9 g/dL — ABNORMAL LOW (ref 12.0–15.0)
Immature Granulocytes: 0 %
Lymphocytes Relative: 20 %
Lymphs Abs: 1.8 10*3/uL (ref 0.7–4.0)
MCH: 33.9 pg (ref 26.0–34.0)
MCHC: 31.4 g/dL (ref 30.0–36.0)
MCV: 107.8 fL — ABNORMAL HIGH (ref 80.0–100.0)
Monocytes Absolute: 1 10*3/uL (ref 0.1–1.0)
Monocytes Relative: 11 %
Neutro Abs: 5.9 10*3/uL (ref 1.7–7.7)
Neutrophils Relative %: 65 %
Platelets: 227 10*3/uL (ref 150–400)
RBC: 3.22 MIL/uL — ABNORMAL LOW (ref 3.87–5.11)
RDW: 15.6 % — ABNORMAL HIGH (ref 11.5–15.5)
WBC: 9.1 10*3/uL (ref 4.0–10.5)
nRBC: 0 % (ref 0.0–0.2)

## 2021-12-16 LAB — GLUCOSE, CAPILLARY: Glucose-Capillary: 97 mg/dL (ref 70–99)

## 2021-12-16 SURGERY — DIALYSIS/PERMA CATHETER INSERTION
Anesthesia: Moderate Sedation

## 2021-12-16 MED ORDER — FENTANYL CITRATE PF 50 MCG/ML IJ SOSY
PREFILLED_SYRINGE | INTRAMUSCULAR | Status: AC
  Filled 2021-12-16: qty 1

## 2021-12-16 MED ORDER — ANTICOAGULANT SODIUM CITRATE 4% (200MG/5ML) IV SOLN: 5.0000 mL | Status: DC | PRN

## 2021-12-16 MED ORDER — LIDOCAINE HCL (PF) 1 % IJ SOLN: 5.0000 mL | INTRAMUSCULAR | Status: DC | PRN

## 2021-12-16 MED ORDER — PENTAFLUOROPROP-TETRAFLUOROETH EX AERO: 1.0000 | INHALATION_SPRAY | CUTANEOUS | Status: DC | PRN

## 2021-12-16 MED ORDER — FENTANYL CITRATE (PF) 100 MCG/2ML IJ SOLN
INTRAMUSCULAR | Status: DC | PRN
  Administered 2021-12-16 (×3): 50 ug via INTRAVENOUS

## 2021-12-16 MED ORDER — HEPARIN SODIUM (PORCINE) 10000 UNIT/ML IJ SOLN
INTRAMUSCULAR | Status: AC
  Filled 2021-12-16: qty 1

## 2021-12-16 MED ORDER — MIDODRINE HCL 5 MG PO TABS
10.0000 mg | ORAL_TABLET | Freq: Once | ORAL | Status: AC
  Administered 2021-12-16: 10 mg via ORAL
  Filled 2021-12-16: qty 2

## 2021-12-16 MED ORDER — CEFAZOLIN SODIUM-DEXTROSE 1-4 GM/50ML-% IV SOLN
INTRAVENOUS | Status: AC
  Administered 2021-12-16: 1 g via INTRAVENOUS
  Filled 2021-12-16: qty 50

## 2021-12-16 MED ORDER — CEFAZOLIN SODIUM-DEXTROSE 1-4 GM/50ML-% IV SOLN: 1.0000 g | INTRAVENOUS | Status: AC

## 2021-12-16 MED ORDER — MIDODRINE HCL 5 MG PO TABS
ORAL_TABLET | ORAL | Status: AC
  Filled 2021-12-16: qty 2

## 2021-12-16 MED ORDER — LIDOCAINE-PRILOCAINE 2.5-2.5 % EX CREA: 1.0000 | TOPICAL_CREAM | CUTANEOUS | Status: DC | PRN

## 2021-12-16 MED ORDER — SODIUM CHLORIDE 0.9 % IV SOLN: INTRAVENOUS | Status: DC

## 2021-12-16 MED ORDER — CHLORHEXIDINE GLUCONATE CLOTH 2 % EX PADS
6.0000 | MEDICATED_PAD | Freq: Every day | CUTANEOUS | Status: DC
  Filled 2021-12-16 (×3): qty 6

## 2021-12-16 MED ORDER — HEPARIN SODIUM (PORCINE) 1000 UNIT/ML DIALYSIS: 1000.0000 [IU] | INTRAMUSCULAR | Status: DC | PRN

## 2021-12-16 MED ORDER — MIDAZOLAM HCL 2 MG/2ML IJ SOLN
INTRAMUSCULAR | Status: AC
  Filled 2021-12-16: qty 4

## 2021-12-16 MED ORDER — ALTEPLASE 2 MG IJ SOLR: 2.0000 mg | Freq: Once | INTRAMUSCULAR | Status: DC | PRN

## 2021-12-16 MED ORDER — MIDAZOLAM HCL 2 MG/2ML IJ SOLN
INTRAMUSCULAR | Status: DC | PRN
  Administered 2021-12-16 (×2): 1 mg via INTRAVENOUS
  Administered 2021-12-16: 2 mg via INTRAVENOUS

## 2021-12-16 MED ORDER — FENTANYL CITRATE PF 50 MCG/ML IJ SOSY
PREFILLED_SYRINGE | INTRAMUSCULAR | Status: AC
  Filled 2021-12-16: qty 2

## 2021-12-16 SURGICAL SUPPLY — 7 items
BIOPATCH RED 1 DISK 7.0 (GAUZE/BANDAGES/DRESSINGS) IMPLANT
CATH PALIN MAXID VT KIT 19CM (CATHETERS) IMPLANT
CATH PALINDROME-SP 14.5FX23 (CATHETERS) IMPLANT
GUIDEWIRE SUPER STIFF .035X180 (WIRE) IMPLANT
PACK ANGIOGRAPHY (CUSTOM PROCEDURE TRAY) IMPLANT
SUT MNCRL AB 4-0 PS2 18 (SUTURE) IMPLANT
SUT SILK 0 FSL (SUTURE) IMPLANT

## 2021-12-16 NOTE — Progress Notes (Signed)
Spoke to Dr Charna Archer, dialysis, and ER RN Elana. Plan for patient to come to [facility] for permcath exchange, followed by dialysis, and then back to ED to be discharged.

## 2021-12-16 NOTE — Progress Notes (Signed)
Pt eating and drinking

## 2021-12-16 NOTE — ED Notes (Signed)
Pt. To ED for clogged dialysis perm-cath. Pt. States yesterday at dialysis permcath stopped working. Pt. States she is supposed to have dialysis today, and is hoping to get her permcath changed today, and then leave and have dialysis after lunch. Pt. Alert and oriented, NAD, denies any pain or abnormality in

## 2021-12-16 NOTE — Interval H&P Note (Signed)
History and Physical Interval Note:  12/16/2021 2:22 PM  Nicole Schmidt  has presented today for surgery, with the diagnosis of end stage renal disease.  The various methods of treatment have been discussed with the patient and family. After consideration of risks, benefits and other options for treatment, the patient has consented to  Procedure(s): DIALYSIS/PERMA CATHETER INSERTION (N/A) as a surgical intervention.  The patient's history has been reviewed, patient examined, no change in status, stable for surgery.  I have reviewed the patient's chart and labs.  Questions were answered to the patient's satisfaction.     [name]

## 2021-12-16 NOTE — ED Triage Notes (Signed)
Pt to ED via POV stating that yesterday at dialysis her port stopped working. Pt states that she was not able to complete her treatment yesterday. Pt states that they had keep stopping her treatment on Tuesday because her blood pressure kept dropping. Pt is in NAD at this time.

## 2021-12-16 NOTE — Telephone Encounter (Signed)
I attempted to get the patient scheduled for a permcath exchange, I was unable to get this scheduled so the patient has been advised to go the ED to be evaluated. Patient stated she understood and will go to the ED.

## 2021-12-16 NOTE — Op Note (Signed)
OPERATIVE NOTE   PROCEDURE: Insertion of tunneled dialysis catheter right IJ approach same venous access.  PRE-OPERATIVE DIAGNOSIS: Nonfunction of existing tunneled dialysis catheter, and stage renal disease requiring hemodialysis   POST-OPERATIVE DIAGNOSIS: Same   SURGEON: [name]  ANESTHESIA: Conscious sedation was administered under my direct supervision by the interventional radiology RN.  IV Versed plus fentanyl were utilized. Continuous ECG, pulse oximetry and blood pressure was monitored throughout the entire procedure.  Conscious sedation was for a total of 40 minutes and 58 seconds.  ESTIMATED BLOOD LOSS: Minimal cc  CONTRAST USED:  None  FLUOROSCOPY TIME: 0.6 minutes  INDICATIONS:   Nicole Schmidt is a 56 y.o.y.o. female who presents with poor flow and nonfunction of the tunneled dialysis catheter.  Adequate dialysis has not been possible.  She is therefore undergoing exchange of her tunneled catheter same venous access.  Risks and benefits of been reviewed patient agrees to proceed  DESCRIPTION: After obtaining full informed written consent, the patient was positioned supine. The right neck and chest wall was prepped and draped in a sterile fashion. The cuff is localized and using blunt and sharp dissection it is freed from the surrounding adhesions.  The existing catheter is then transected proximal to the cuff.  The guidewire is advanced without difficulty under fluoroscopy.  Dilators are passed over the wire as needed and the tunneled dialysis catheter is fed into the central venous system without difficulty.  Under fluoroscopy the catheter tip positioned at the atrial caval junction.  Both lumens aspirate and flush easily. After verification of smooth contour with proper tip position under fluoroscopy the catheter is packed with 5000 units of heparin per lumen.  Catheter secured to the skin of the right chest wall with 0 silk. A sterile dressing is applied with a  Biopatch.  COMPLICATIONS: None  CONDITION: Good  [name] [facility] Office:  [phone]   12/16/2021,2:23 PM

## 2021-12-16 NOTE — Progress Notes (Signed)
PT did 4 hrs of HD, tolerated well  UF = 3300 ml  Pre HD weight 160.3 kg Post HD weight 157 kg    12/16/21 1945  Vitals  Temp Source Oral  BP 112/69  MAP (mmHg) 79  BP Location Right Arm  BP Method Automatic  Patient Position (if appropriate) Lying  Pulse Rate 84  Pulse Rate Source Monitor  ECG Heart Rate 81  Resp (!) 21  Oxygen Therapy  SpO2 100 %  Post Treatment  Dialyzer Clearance Lightly streaked  Duration of HD Treatment -hour(s) 4 hour(s)  Hemodialysis Intake (mL) 0 mL  Liters Processed 96  Fluid Removed 3300 mL  Tolerated HD Treatment Yes  Post-Hemodialysis Comments tolerated tx well  Fistula / Graft Left Forearm Arteriovenous fistula  Placement Date/Time: 12/09/21 0946   Placed prior to admission: No  Orientation: Left  Access Location: Forearm  Access Type: Arteriovenous fistula  Site Condition No complications  Hemodialysis Catheter Right Internal jugular Double lumen Permanent (Tunneled)  Placement Date/Time: 12/16/21 1352   Time Out: Correct patient;Correct site;Correct procedure  Maximum sterile barrier precautions: Hand hygiene;Sterile gloves;Large sterile sheet;Cap;Mask;Sterile gown  Site Prep: Chlorhexidine (preferred);Skin Prep C...  Site Condition No complications  Blue Lumen Status Flushed;Dead end cap in place;Heparin locked  Red Lumen Status Flushed;Dead end cap in place;Heparin locked  Catheter fill solution Heparin 1000 units/ml  Catheter fill volume (Arterial) 1.6 cc  Catheter fill volume (Venous) 1.7  Dressing Type Gauze/Drain sponge  Dressing Status Clean, Dry, Intact  Interventions Dressing reinforced  Drainage Description None  Post treatment catheter status Capped and Clamped

## 2021-12-16 NOTE — ED Notes (Signed)
This RN spoke to [place] from specials, states they will call later for report, and estimated time of procedure in about an hour. Erin, RN states don't worry about starting Ancef, or getting pt. Undressed as pt. Is sitting in hallway in ED.

## 2021-12-16 NOTE — ED Notes (Signed)
ED Provider at bedside. 

## 2021-12-16 NOTE — ED Provider Notes (Signed)
[facility] Provider Note    Event Date/Time   First MD Initiated Contact with Patient 12/16/21 [phone]     (approximate)   History   Chief Complaint Vascular Access Problem   HPI  ELANY FELIX is a 56 y.o. female with past medical history of hypertension, hyperlipidemia, diabetes, atrial fibrillation on Eliquis, PE, CHF, and ESRD on HD (TTS) who presents to the ED complaining of vascular access problem.  Patient reports that she was only able to receive a partial run of dialysis due to issues with clotting in her right IJ permacath.  She then returned to dialysis yesterday, where they were unable to perform any dialysis at all due to issues with the catheter.  Patient states that she was told to come to the ED for exchange of her catheter and a round of dialysis.  She currently denies any complaints, denies any difficulty breathing or pain in her chest.     Physical Exam   Triage Vital Signs: ED Triage Vitals  Enc Vitals Group     BP 12/16/21 0824 (!) 107/53     Pulse Rate 12/16/21 0824 100     Resp 12/16/21 0824 18     Temp 12/16/21 0824 97.8 F (36.6 C)     Temp Source 12/16/21 0824 Oral     SpO2 12/16/21 0824 92 %     Weight --      Height --      Head Circumference --      Peak Flow --      Pain Score 12/16/21 0821 0     Pain Loc --      Pain Edu? --      Excl. in [place]? --     Most recent vital signs: Vitals:   12/16/21 0824  BP: (!) 107/53  Pulse: 100  Resp: 18  Temp: 97.8 F (36.6 C)  SpO2: 92%    Constitutional: Alert and oriented. Eyes: Conjunctivae are normal. Head: Atraumatic. Nose: No congestion/rhinnorhea. Mouth/Throat: Mucous membranes are moist.  Cardiovascular: Normal rate, regular rhythm. Grossly normal heart sounds.  2+ radial pulses bilaterally.  Right IJ permacath in place. Respiratory: Normal respiratory effort.  No retractions. Lungs CTAB. Gastrointestinal: Soft and nontender. No distention. Musculoskeletal: No  lower extremity tenderness, 1+ pitting edema to bilateral lower extremities. Neurologic:  Normal speech and language. No gross focal neurologic deficits are appreciated.    ED Results / Procedures / Treatments   Labs (all labs ordered are listed, but only abnormal results are displayed) Labs Reviewed  CBC WITH DIFFERENTIAL/PLATELET - Abnormal; Notable for the following components:      Result Value   RBC 3.22 (*)    Hemoglobin 10.9 (*)    HCT 34.7 (*)    MCV 107.8 (*)    RDW 15.6 (*)    All other components within normal limits  BASIC METABOLIC PANEL - Abnormal; Notable for the following components:   Glucose, Bld 140 (*)    BUN 62 (*)    Creatinine, Ser 8.99 (*)    GFR, Estimated 5 (*)    All other components within normal limits    PROCEDURES:  Critical Care performed: No  Procedures   MEDICATIONS ORDERED IN ED: Medications - No data to display   IMPRESSION / MDM / [place] / ED COURSE  I reviewed the triage vital signs and the nursing notes.  56 y.o. female with past medical history of hypertension, hyperlipidemia, diabetes, atrial fibrillation on Eliquis, PE, CHF, and ESRD on HD (TTS) who presents to the ED complaining of problems with her dialysis catheter and inability to receive dialysis over the past 3 days.  Patient's presentation is most consistent with acute presentation with potential threat to life or bodily function.  Differential diagnosis includes, but is not limited to, hyperkalemia, hypervolemia, infected catheter, clotted catheter.  Patient well-appearing and in no acute distress, vital signs are unremarkable.  She denies any breathing difficulties and has oxygen saturations of 92% on room air.  She does appear slightly fluid overloaded but labs are reassuring with no significant hyperkalemia.  Stable chronic anemia noted with no significant leukocytosis.  Case discussed with Dr. Holley Raring of nephrology, who would  like to have patient get a round of dialysis today and request that I speak with Dr. Delana Meyer of vascular surgery to arrange catheter replacement.  Case discussed with Dr. Delana Meyer, who will plan for exchange of her dialysis catheter.  Plan will be for patient to have dialysis here at [facility] following catheter replacement, after which she will return to the ED and be discharged home.      FINAL CLINICAL IMPRESSION(S) / ED DIAGNOSES   Final diagnoses:  Problem with vascular access  ESRD on hemodialysis ([place])     Rx / DC Orders   ED Discharge Orders     None        Note:  This document was prepared using Dragon voice recognition software and may include unintentional dictation errors.   Blake Divine, MD 12/16/21 1208

## 2021-12-16 NOTE — ED Provider Notes (Signed)
Patient just presented to the ER from dialysis after having catheterization and dialysis.  Plan was for discharge after dialysis which she has completed she has no complaints no other concerns she is hemodynamically stable and appropriate for outpatient follow-up.   Merlyn Lot, MD 12/16/21 2024

## 2021-12-19 ENCOUNTER — Encounter: Payer: Self-pay | Admitting: Vascular Surgery

## 2021-12-19 DIAGNOSIS — E662 Morbid (severe) obesity with alveolar hypoventilation: Secondary | ICD-10-CM | POA: Diagnosis not present

## 2021-12-21 NOTE — Telephone Encounter (Signed)
Error

## 2021-12-23 DIAGNOSIS — I482 Chronic atrial fibrillation, unspecified: Secondary | ICD-10-CM | POA: Diagnosis not present

## 2021-12-23 DIAGNOSIS — I5032 Chronic diastolic (congestive) heart failure: Secondary | ICD-10-CM | POA: Diagnosis not present

## 2021-12-23 DIAGNOSIS — G4733 Obstructive sleep apnea (adult) (pediatric): Secondary | ICD-10-CM | POA: Diagnosis not present

## 2021-12-23 DIAGNOSIS — J9601 Acute respiratory failure with hypoxia: Secondary | ICD-10-CM | POA: Diagnosis not present

## 2021-12-26 DIAGNOSIS — E559 Vitamin D deficiency, unspecified: Secondary | ICD-10-CM | POA: Diagnosis not present

## 2021-12-26 DIAGNOSIS — Z13820 Encounter for screening for osteoporosis: Secondary | ICD-10-CM | POA: Diagnosis not present

## 2021-12-26 DIAGNOSIS — E1165 Type 2 diabetes mellitus with hyperglycemia: Secondary | ICD-10-CM | POA: Diagnosis not present

## 2021-12-26 DIAGNOSIS — I10 Essential (primary) hypertension: Secondary | ICD-10-CM | POA: Diagnosis not present

## 2021-12-28 ENCOUNTER — Telehealth (INDEPENDENT_AMBULATORY_CARE_PROVIDER_SITE_OTHER): Payer: Self-pay

## 2021-12-28 NOTE — Telephone Encounter (Signed)
Dana with [facility] called concerning pt's ADF of left upper arm.  She states that it is dehencing further and due to it being so opened up Dr Holley Raring wants her to be seen sooner.  Please advise

## 2021-12-28 NOTE — Telephone Encounter (Signed)
Spoke with Eulogio Ditch NP and pt is coming to see her in the am for a check of the area.

## 2021-12-29 ENCOUNTER — Ambulatory Visit (INDEPENDENT_AMBULATORY_CARE_PROVIDER_SITE_OTHER): Payer: BC Managed Care – PPO | Admitting: Nurse Practitioner

## 2021-12-29 ENCOUNTER — Encounter (INDEPENDENT_AMBULATORY_CARE_PROVIDER_SITE_OTHER): Payer: Self-pay | Admitting: Nurse Practitioner

## 2021-12-29 VITALS — BP 130/70 | HR 92 | Resp 18 | Ht 64.0 in | Wt 346.6 lb

## 2021-12-29 DIAGNOSIS — N186 End stage renal disease: Secondary | ICD-10-CM

## 2021-12-29 MED ORDER — DOXYCYCLINE HYCLATE 100 MG PO CAPS: 100.0000 mg | ORAL_CAPSULE | Freq: Two times a day (BID) | ORAL | 0 refills | Status: DC

## 2022-01-09 ENCOUNTER — Encounter: Payer: Self-pay | Admitting: Oncology

## 2022-01-09 ENCOUNTER — Inpatient Hospital Stay: Payer: BC Managed Care – PPO | Attending: Oncology | Admitting: Oncology

## 2022-01-09 ENCOUNTER — Encounter (INDEPENDENT_AMBULATORY_CARE_PROVIDER_SITE_OTHER): Payer: BC Managed Care – PPO

## 2022-01-09 ENCOUNTER — Ambulatory Visit (INDEPENDENT_AMBULATORY_CARE_PROVIDER_SITE_OTHER): Payer: BC Managed Care – PPO | Admitting: Vascular Surgery

## 2022-01-09 VITALS — BP 103/49 | HR 101 | Resp 18 | Wt 353.2 lb

## 2022-01-09 DIAGNOSIS — Z86711 Personal history of pulmonary embolism: Secondary | ICD-10-CM | POA: Insufficient documentation

## 2022-01-09 DIAGNOSIS — Z08 Encounter for follow-up examination after completed treatment for malignant neoplasm: Secondary | ICD-10-CM | POA: Diagnosis not present

## 2022-01-09 DIAGNOSIS — Z7901 Long term (current) use of anticoagulants: Secondary | ICD-10-CM | POA: Diagnosis not present

## 2022-01-09 DIAGNOSIS — Z853 Personal history of malignant neoplasm of breast: Secondary | ICD-10-CM | POA: Insufficient documentation

## 2022-01-09 DIAGNOSIS — C50911 Malignant neoplasm of unspecified site of right female breast: Secondary | ICD-10-CM

## 2022-01-10 ENCOUNTER — Ambulatory Visit: Payer: BC Managed Care – PPO | Admitting: Oncology

## 2022-01-12 DIAGNOSIS — N186 End stage renal disease: Secondary | ICD-10-CM | POA: Diagnosis not present

## 2022-01-12 DIAGNOSIS — Z992 Dependence on renal dialysis: Secondary | ICD-10-CM | POA: Diagnosis not present

## 2022-01-13 ENCOUNTER — Ambulatory Visit: Payer: BC Managed Care – PPO | Attending: Nurse Practitioner | Admitting: Nurse Practitioner

## 2022-01-13 ENCOUNTER — Encounter: Payer: Self-pay | Admitting: Nurse Practitioner

## 2022-01-13 VITALS — BP 107/67 | HR 98 | Ht 64.0 in | Wt 347.2 lb

## 2022-01-13 DIAGNOSIS — I5032 Chronic diastolic (congestive) heart failure: Secondary | ICD-10-CM

## 2022-01-13 DIAGNOSIS — Z992 Dependence on renal dialysis: Secondary | ICD-10-CM

## 2022-01-13 DIAGNOSIS — N186 End stage renal disease: Secondary | ICD-10-CM

## 2022-01-13 DIAGNOSIS — J9611 Chronic respiratory failure with hypoxia: Secondary | ICD-10-CM

## 2022-01-13 DIAGNOSIS — I10 Essential (primary) hypertension: Secondary | ICD-10-CM

## 2022-01-13 DIAGNOSIS — E782 Mixed hyperlipidemia: Secondary | ICD-10-CM

## 2022-01-13 DIAGNOSIS — I4821 Permanent atrial fibrillation: Secondary | ICD-10-CM | POA: Diagnosis not present

## 2022-01-13 DIAGNOSIS — E1169 Type 2 diabetes mellitus with other specified complication: Secondary | ICD-10-CM

## 2022-01-13 DIAGNOSIS — E669 Obesity, unspecified: Secondary | ICD-10-CM

## 2022-01-13 NOTE — Progress Notes (Signed)
Office Visit    Patient Name: Nicole Schmidt Date of Encounter: 01/13/2022  Primary Care Provider:  Lenard Simmer, MD Primary Cardiologist:  Kathlyn Sacramento, MD  Chief Complaint    56 year old female with history of breast cancer status postradiation, chronic HFpEF, permanent atrial fibrillation, hypertension, hyperlipidemia, type 2 diabetes mellitus, end-stage renal disease, anemia, obesity, hypothyroidism, and pulm embolism, who presents for follow-up related to HFpEF.  Past Medical History    Past Medical History:  Diagnosis Date   (HFpEF) heart failure with preserved ejection fraction ([place])    a. 10/2018 Echo: EF 60-65%, diast dysfxn, RVSP 50.7 mmHg; b. 01/2020 Echo: EF 65-70%, G2DD; c. 01/2021 Echo: EF 60-65%, no rwma, mod LVH. Nl RV fxn, mildly enlarged RV. Mod BAE. Triv MR. d. 05/2021 Echo: EF 55-60; no RWMAs, mild LVH, mild TR.   Acquired hypothyroidism 02/09/2020   Adenoma of left adrenal gland    Anemia    Aortic atherosclerosis (HCC)    Arthritis    Breast cancer of upper-inner quadrant of right female breast ([place])    Right breast invasive CA and DCIS , 7 mm T1,N0,M0. Er/PR pos, her 2 negative.  Margins 1 mm.   COPD (chronic obstructive pulmonary disease) (HCC)    Diabetes mellitus type 2 in obese ([place]) 02/09/2020   Difficult intubation 09/28/2021   a.) mallampati III-IV; BMI >55 kg/m   Diverticulosis    Dyslipidemia 02/09/2020   ESBL (extended spectrum beta-lactamase) producing bacteria infection 03/20/2021   a. urine culture (+) for ESBL producing E.coli and P. mirabilis   ESRD (end stage renal disease) on dialysis ([place])    a. T-Th-Sat HD initiated 02/2021   Essential hypertension    Gout    History of kidney stones    Long term current use of anticoagulant    a.) apixaban   Menopause    age 72   Morbid obesity ([place])    OSA treated with BiPAP    a. on nocturnal PAP therapy; Trilogy 100 ventilator   Permanent atrial fibrillation ([place])    a. Dx 10/2018  in setting of PNA. b. CHA2DS2-VASc = 7 (sex, HFpEF, HTN, PE x2, aortic plaque, T2DM); c. s/p successful DCCV 11/2018. d. subsequent recurrent Afib; rate/rhythm maintained on oral metoprolol succinate; chronically anticoagulated using standard dose apixaban.   Personal history of radiation therapy    Pulmonary embolism ([place]) 10/2014   a. on chronic apixaban   Restrictive lung disease    Shingles 10/24/2021   Sleep difficulties    a.) takes melatonin   Thyroid goiter    Past Surgical History:  Procedure Laterality Date   A/V FISTULAGRAM Left 09/02/2021   Procedure: A/V Fistulagram;  Surgeon: Katha Cabal, MD;  Location: [place] [facility];  Service: Cardiovascular;  Laterality: Left;   AV FISTULA PLACEMENT Left 07/22/2021   Procedure: ARTERIOVENOUS (AV) FISTULA CREATION (RADIALCEPHALIC);  Surgeon: Katha Cabal, MD;  Location: [facility];  Service: Vascular;  Laterality: Left;   AV FISTULA PLACEMENT Left 12/09/2021   Procedure: ARTERIOVENOUS (AV) FISTULA CREATION (BRACHIAL CEPHALIC);  Surgeon: Katha Cabal, MD;  Location: [facility];  Service: Vascular;  Laterality: Left;   BREAST BIOPSY Right 2014   breast ca   BREAST EXCISIONAL BIOPSY Right 07/19/2012   breast ca   BREAST SURGERY Right 2014   with sentinel node bx subareaolar duct excision   CARDIOVERSION N/A 11/28/2018   Procedure: CARDIOVERSION;  Surgeon: Minna Merritts, MD;  Location: [facility];  Service:  Cardiovascular;  Laterality: N/A;   COLONOSCOPY WITH PROPOFOL N/A 01/30/2017   Procedure: COLONOSCOPY WITH PROPOFOL;  Surgeon: Christene Lye, MD;  Location: [facility];  Service: Endoscopy;  Laterality: N/A;   CYSTOSCOPY W/ RETROGRADES  06/17/2021   Procedure: CYSTOSCOPY WITH RETROGRADE PYELOGRAM;  Surgeon: Billey Co, MD;  Location: [facility];  Service: Urology;;   CYSTOSCOPY W/ URETERAL STENT PLACEMENT Left 02/14/2021   Procedure: CYSTOSCOPY WITH RETROGRADE PYELOGRAM/URETERAL STENT PLACEMENT;  Surgeon:  Billey Co, MD;  Location: [facility];  Service: Urology;  Laterality: Left;   CYSTOSCOPY/URETEROSCOPY/HOLMIUM LASER/STENT PLACEMENT Left 06/17/2021   Procedure: CYSTOSCOPY/URETEROSCOPY/HOLMIUM LASER/STENT EXCHANGE;  Surgeon: Billey Co, MD;  Location: [facility];  Service: Urology;  Laterality: Left;   DIALYSIS/PERMA CATHETER INSERTION N/A 02/24/2021   Procedure: DIALYSIS/PERMA CATHETER INSERTION;  Surgeon: Algernon Huxley, MD;  Location: [place] [facility];  Service: Cardiovascular;  Laterality: N/A;   DIALYSIS/PERMA CATHETER INSERTION N/A 12/16/2021   Procedure: DIALYSIS/PERMA CATHETER INSERTION;  Surgeon: Katha Cabal, MD;  Location: [place] [facility];  Service: Cardiovascular;  Laterality: N/A;   PERIPHERAL VASCULAR THROMBECTOMY Left 09/28/2021   Procedure: PERIPHERAL VASCULAR THROMBECTOMY;  Surgeon: Katha Cabal, MD;  Location: [place] [facility];  Service: Cardiovascular;  Laterality: Left;    Allergies  Allergies  Allergen Reactions   Spironolactone     Hyperkalemia  Pt unaware of this allergy    History of Present Illness    56 year old female with above complex past medical history including breast cancer status postradiation, chronic HFpEF, permanent atrial fibrillation, hypertension, hyperlipidemia, diabetes, end-stage renal disease, anemia, obesity, hypothyroidism, and pulmonary embolism.  She has been on chronic Eliquis since PE in September 2016.  She was diagnosed with atrial fibrillation in September 2020, and subsequently underwent cardioversion in October 2020, without significant improvement in dyspnea and fatigue.  In December 2021, she was hospitalized with respiratory failure secondary to heart failure and pneumonia.  Echo showed EF of 65% with severe LVH.  Spironolactone was discontinued secondary to hyperkalemia.  She recurrent atrial fibrillation July 2022 and was minimally symptomatic.  Multiple comorbidities, was felt that maintaining sinus rhythm  would be difficult and decision was made to continue a rate control strategy.  She was initiated on hemodialysis in January 2023 in the setting of admission for heart failure and acute renal failure.  In the setting of obesity hypoventilation syndrome, she is to manage with a trilogy device at home.  She required ER visit in March 2023 secondary to hypoxia and altered mental status that occurred after she took off her trilogy device and then fell to reapply it.  Troponin was mildly elevated at 161.  Limited echo at that time showed an EF of 55 to 60% without regional wall motion abnormalities.  Due to body habitus and lack of symptoms, we did not pursue ischemic testing.  Ms. Jewel was last seen in cardiology clinic in late October 2023 at which time she was doing well and was pending AV fistula surgery 4 days later.  She has recovered well from her left upper arm surgery.  She continues to receive dialysis Tuesdays, Thursdays, and Saturdays through a vascular access site.  She tolerates this well.  She sometimes has hypotension requiring midodrine.  She does not experience chest pain.  She has chronic, stable dyspnea on exertion and mild lower extremity swelling though notes that because of the Thanksgiving holiday and issues with the dialysis machine yesterday, she has more volume today than she  would usually expect to have on a Friday.  She denies palpitations, PND, orthopnea, dizziness, syncope, or early satiety.  Home Medications    Current Outpatient Medications  Medication Sig Dispense Refill   amLODipine (NORVASC) 5 MG tablet Take 1 tablet (5 mg total) by mouth daily. 90 tablet 3   apixaban (ELIQUIS) 5 MG TABS tablet Take 1 tablet (5 mg total) by mouth 2 (two) times daily. 180 tablet 1   atorvastatin (LIPITOR) 40 MG tablet Take 40 mg by mouth daily.     calcium carbonate (CALCIUM 600) 600 MG TABS tablet Take 600 mg by mouth daily with breakfast.     cloNIDine (CATAPRES) 0.2 MG tablet Take 0.2 mg  by mouth 2 (two) times daily.     CRANBERRY PO Take 1 capsule by mouth daily.     doxycycline (VIBRAMYCIN) 100 MG capsule Take 1 capsule (100 mg total) by mouth 2 (two) times daily. 28 capsule 0   ergocalciferol (VITAMIN D2) 1.25 MG (50000 UT) capsule Take 1 capsule (50,000 Units total) by mouth once a week. 5 capsule 0   ezetimibe (ZETIA) 10 MG tablet Take 10 mg by mouth daily.     ferric gluconate 250 mg in sodium chloride 0.9 % 250 mL Inject 250 mg into the vein Every Tuesday,Thursday,and Saturday with dialysis.     folic acid (FOLVITE) 1 MG tablet Take 1 tablet (1 mg total) by mouth daily. 30 tablet 0   gabapentin (NEURONTIN) 300 MG capsule Take 1 capsule (300 mg total) by mouth at bedtime. 30 capsule 0   Lancets (ONETOUCH DELICA PLUS [id number]) MISC      levothyroxine (SYNTHROID) 112 MCG tablet Take 1 tablet (112 mcg total) by mouth daily before breakfast. 30 tablet 0   melatonin 5 MG TABS Take 2 tablets (10 mg total) by mouth at bedtime as needed (sleep). 60 tablet 0   metoprolol succinate (TOPROL-XL) 25 MG 24 hr tablet Take 1 tablet (25 mg total) by mouth daily. Take with or immediately following a meal. 90 tablet 3   midodrine (PROAMATINE) 10 MG tablet Take 1 tablet (10 mg total) by mouth Every Tuesday,Thursday,and Saturday with dialysis. 30 tablet 0   ONETOUCH ULTRA test strip      polyethylene glycol powder (GLYCOLAX/MIRALAX) 17 GM/SCOOP powder Take 1 Container by mouth once.     sevelamer carbonate (RENVELA) 800 MG tablet Take 1 tablet (800 mg total) by mouth 3 (three) times daily with meals. 90 tablet 0   TRULICITY 1.5 JI/9.6VE SOPN Inject 1.5 mg into the skin every Monday.     acetaminophen (TYLENOL) 325 MG tablet Take 2 tablets (650 mg total) by mouth every 6 (six) hours as needed for mild pain (or Fever >/= 101). (Patient not taking: Reported on 12/09/2021)     ADVAIR DISKUS 250-50 MCG/ACT AEPB Inhale 1 puff into the lungs 2 (two) times daily. (Patient not taking: Reported on  12/29/2021)     albuterol (VENTOLIN HFA) 108 (90 Base) MCG/ACT inhaler Inhale 2 puffs into the lungs every 6 (six) hours as needed for wheezing or shortness of breath. (Patient not taking: Reported on 12/29/2021) 8.5 g 0   calcitRIOL (ROCALTROL) 0.5 MCG capsule Take 0.5 mcg by mouth daily. (Patient not taking: Reported on 12/29/2021)     diclofenac Sodium (VOLTAREN) 1 % GEL Apply 2 g topically 4 (four) times daily. (Patient not taking: Reported on 12/09/2021) 100 g 0   HYDROcodone-acetaminophen (NORCO) 5-325 MG tablet Take 1-2 tablets by mouth every 6 (  six) hours as needed. (Patient not taking: Reported on 01/09/2022) 25 tablet 0   HYDROcodone-acetaminophen (NORCO) 5-325 MG tablet Take 1-2 tablets by mouth every 6 (six) hours as needed for moderate pain or severe pain. (Patient not taking: Reported on 01/09/2022) 36 tablet 0   senna (SENOKOT) 8.6 MG tablet Take 1 tablet by mouth as needed for constipation. (Patient not taking: Reported on 12/09/2021)     traMADol (ULTRAM) 50 MG tablet Take 1 tablet (50 mg total) by mouth every 12 (twelve) hours as needed for moderate pain. (Patient not taking: Reported on 01/09/2022) 30 tablet 0   No current facility-administered medications for this visit.     Review of Systems    Chronic, stable dyspnea on exertion.  Mild lower extremity swelling which generally improves following dialysis.  She denies chest pain, palpitations, PND, orthopnea, dizziness, syncope, or early satiety.  All other systems reviewed and are otherwise negative except as noted above.    Physical Exam    VS:  BP 107/67 (BP Location: Right Arm, Patient Position: Sitting, Cuff Size: Normal)   Pulse 98   Ht '5\' 4"'$  (1.626 m)   Wt (!) 347 lb 3.2 oz (157.5 kg)   LMP 07/17/2012   SpO2 94%   BMI 59.60 kg/m  , BMI Body mass index is 59.6 kg/m.     GEN: Morbidly obese, in no acute distress. HEENT: normal. Neck: Supple, obese, difficult to gauge JVP secondary to body habitus.  No bruits or  masses.  Cardiac: Irregularly irregular, no murmurs, rubs, or gallops. No clubbing, cyanosis, trace bilateral ankle edema.  Radials 2+/PT 1+ and equal bilaterally.  Respiratory:  Respirations regular and unlabored, clear to auscultation bilaterally. GI: Soft, nontender, nondistended, BS + x 4. MS: no deformity or atrophy. Skin: warm and dry, no rash. Neuro:  Strength and sensation are intact. Psych: Normal affect.  Accessory Clinical Findings    ECG personally reviewed by me today -atrial fibrillation, 98,- no acute changes.  Lab Results  Component Value Date   WBC 9.1 12/16/2021   HGB 10.9 (L) 12/16/2021   HCT 34.7 (L) 12/16/2021   MCV 107.8 (H) 12/16/2021   PLT 227 12/16/2021   Lab Results  Component Value Date   CREATININE 8.99 (H) 12/16/2021   BUN 62 (H) 12/16/2021   NA 139 12/16/2021   K 5.0 12/16/2021   CL 98 12/16/2021   CO2 28 12/16/2021   Lab Results  Component Value Date   ALT 6 07/26/2021   AST 17 07/26/2021   ALKPHOS 162 (H) 07/26/2021   BILITOT 0.8 07/26/2021   Lab Results  Component Value Date   CHOL 122 02/09/2020   HDL 49 02/09/2020   LDLCALC 42 02/09/2020   TRIG 153 (H) 02/09/2020   CHOLHDL 2.5 02/09/2020    Lab Results  Component Value Date   HGBA1C 7.2 (H) 02/09/2021    Assessment & Plan    1.  Chronic HFpEF: EF 55 to 60% by echo in April 2023.  She is now on hemodialysis and volume is managed by nephrology.  She makes very little urine at this point.  Heart rate and blood pressure stable.  She has only trace lower extremity edema after having to miss dialysis last Thursday and then having a shortened session yesterday due to issues with the machine.  She is due for dialysis tomorrow.  2.  Essential hypertension: Stable.  3.  Permanent atrial fibrillation: Reasonably rate controlled on beta-blocker therapy.  Anticoagulated with Eliquis  with stable H&H in November.  4.  Hyperlipidemia: Followed by primary care.  Remains on atorvastatin  therapy.  Normal AST/ALT in June.  5.  Type 2 diabetes mellitus: A1c was 7.2 last year.  Follow-up primary care.  6.  Chronic hypoxic respiratory failure: Uses trilogy BiPAP at night and for naps.  7.  End-stage renal disease: Tolerating Tuesday, Thursday, Friday dialysis.  Has vascular surgical follow-up secondary to recent left upper extremity AV fistula.  8.  Disposition: Follow-up in 6 months or sooner if necessary.   Murray Hodgkins, NP 01/13/2022, 2:13 PM

## 2022-01-13 NOTE — Patient Instructions (Signed)
Medication Instructions:  Your physician recommends that you continue on your current medications as directed. Please refer to the Current Medication list given to you today.  *If you need a refill on your cardiac medications before your next appointment, please call your pharmacy*   Lab Work: None ordered  If you have labs (blood work) drawn today and your tests are completely normal, you will receive your results only by: MyChart Message (if you have MyChart) OR A paper copy in the mail If you have any lab test that is abnormal or we need to change your treatment, we will call you to review the results.   Testing/Procedures: None ordered  Follow-Up: At [facility], you and your health needs are our priority.  As part of our continuing mission to provide you with exceptional heart care, we have created designated Provider Care Teams.  These Care Teams include your primary Cardiologist (physician) and Advanced Practice Providers (APPs -  Physician Assistants and Nurse Practitioners) who all work together to provide you with the care you need, when you need it.  We recommend signing up for the patient portal called "MyChart".  Sign up information is provided on this After Visit Summary.  MyChart is used to connect with patients for Virtual Visits (Telemedicine).  Patients are able to view lab/test results, encounter notes, upcoming appointments, etc.  Non-urgent messages can be sent to your provider as well.   To learn more about what you can do with MyChart, go to [email or web address].    Your next appointment:   6 month(s)  The format for your next appointment:   In Person  Provider:   You may see Muhammad Arida, MD or one of the following Advanced Practice Providers on your designated Care Team:   Christopher Berge, NP Ryan Dunn, PA-C Cadence Furth, PA-C Sheri Hammock, NP  Important Information About Sugar        

## 2022-01-13 NOTE — Progress Notes (Signed)
Hematology/Oncology Consult note [facility]  [phone] [phone]  Patient Care Team: Lenard Simmer, MD as PCP - General (Endocrinology) Wellington Hampshire, MD as PCP - Cardiology (Cardiology) Christene Lye, MD (General Surgery) Alisa Graff, FNP as Nurse Practitioner (Family Medicine) Anthonette Legato, MD as Consulting Physician (Nephrology) Sindy Guadeloupe, MD as Consulting Physician (Hematology and Oncology)   Name of the patient: Nicole Schmidt  [id number]  06-04-1965   Date of visit: 01/13/22  Diagnosis-history of pulmonary embolism on Eliquis  Chief complaint/ Reason for visit-routine follow-up of pulmonary embolism  Heme/Onc history: Patient is a 56 year old female with history of 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  She is s/p surgery adjuvant radiation treatment and was on tamoxifen until September 2016 when she developed submassive pulmonary embolism.  She was switched to Femara and she completed 5 years of hormone therapy in June 2020.  Patient has been Eliquis for her pulmonary embolism since then.  Hypercoagulable work-up was negative.    Interval history-patient has baseline fatigueAnd is currently on dialysis for her end-stage renal disease.  Denies any recent falls.  Tolerating Eliquis well without any bleeding issues.  ECOG PS- 2-3 Pain scale- 0   Review of systems- Review of Systems  Constitutional:  Positive for malaise/fatigue. Negative for chills, fever and weight loss.  HENT:  Negative for congestion, ear discharge and nosebleeds.   Eyes:  Negative for blurred vision.  Respiratory:  Negative for cough, hemoptysis, sputum production, shortness of breath and wheezing.   Cardiovascular:  Negative for chest pain, palpitations, orthopnea and claudication.  Gastrointestinal:  Negative for abdominal pain, blood in stool, constipation, diarrhea, heartburn, melena, nausea  and vomiting.  Genitourinary:  Negative for dysuria, flank pain, frequency, hematuria and urgency.  Musculoskeletal:  Negative for back pain, joint pain and myalgias.  Skin:  Negative for rash.  Neurological:  Negative for dizziness, tingling, focal weakness, seizures, weakness and headaches.  Endo/Heme/Allergies:  Does not bruise/bleed easily.  Psychiatric/Behavioral:  Negative for depression and suicidal ideas. The patient does not have insomnia.       Allergies  Allergen Reactions   Spironolactone     Hyperkalemia  Pt unaware of this allergy     Past Medical History:  Diagnosis Date   (HFpEF) heart failure with preserved ejection fraction ([place])    a. 10/2018 Echo: EF 60-65%, diast dysfxn, RVSP 50.7 mmHg; b. 01/2020 Echo: EF 65-70%, G2DD; c. 01/2021 Echo: EF 60-65%, no rwma, mod LVH. Nl RV fxn, mildly enlarged RV. Mod BAE. Triv MR. d. 05/2021 Echo: EF 55-60; no RWMAs, mild LVH, mild TR.   Acquired hypothyroidism 02/09/2020   Adenoma of left adrenal gland    Anemia    Aortic atherosclerosis (HCC)    Arthritis    Breast cancer of upper-inner quadrant of right female breast ([place])    Right breast invasive CA and DCIS , 7 mm T1,N0,M0. Er/PR pos, her 2 negative.  Margins 1 mm.   COPD (chronic obstructive pulmonary disease) (HCC)    Diabetes mellitus type 2 in obese ([place]) 02/09/2020   Difficult intubation 09/28/2021   a.) mallampati III-IV; BMI >55 kg/m   Diverticulosis    Dyslipidemia 02/09/2020   ESBL (extended spectrum beta-lactamase) producing bacteria infection 03/20/2021   a. urine culture (+) for ESBL producing E.coli and P. mirabilis   ESRD (end stage renal disease) on dialysis ([place])    a. T-Th-Sat HD initiated 02/2021  Essential hypertension    Gout    History of kidney stones    Long term current use of anticoagulant    a.) apixaban   Menopause    age 33   Morbid obesity ([place])    OSA treated with BiPAP    a. on nocturnal PAP therapy; Trilogy 100 ventilator    Permanent atrial fibrillation ([place])    a. Dx 10/2018 in setting of PNA. b. CHA2DS2-VASc = 7 (sex, HFpEF, HTN, PE x2, aortic plaque, T2DM); c. s/p successful DCCV 11/2018. d. subsequent recurrent Afib; rate/rhythm maintained on oral metoprolol succinate; chronically anticoagulated using standard dose apixaban.   Personal history of radiation therapy    Pulmonary embolism ([place]) 10/2014   a. on chronic apixaban   Restrictive lung disease    Shingles 10/24/2021   Sleep difficulties    a.) takes melatonin   Thyroid goiter      Past Surgical History:  Procedure Laterality Date   A/V FISTULAGRAM Left 09/02/2021   Procedure: A/V Fistulagram;  Surgeon: Katha Cabal, MD;  Location: [name] [facility];  Service: Cardiovascular;  Laterality: Left;   AV FISTULA PLACEMENT Left 07/22/2021   Procedure: ARTERIOVENOUS (AV) FISTULA CREATION (RADIALCEPHALIC);  Surgeon: Katha Cabal, MD;  Location: [facility];  Service: Vascular;  Laterality: Left;   AV FISTULA PLACEMENT Left 12/09/2021   Procedure: ARTERIOVENOUS (AV) FISTULA CREATION (BRACHIAL CEPHALIC);  Surgeon: Katha Cabal, MD;  Location: [facility];  Service: Vascular;  Laterality: Left;   BREAST BIOPSY Right 2014   breast ca   BREAST EXCISIONAL BIOPSY Right 07/19/2012   breast ca   BREAST SURGERY Right 2014   with sentinel node bx subareaolar duct excision   CARDIOVERSION N/A 11/28/2018   Procedure: CARDIOVERSION;  Surgeon: Minna Merritts, MD;  Location: [facility];  Service: Cardiovascular;  Laterality: N/A;   COLONOSCOPY WITH PROPOFOL N/A 01/30/2017   Procedure: COLONOSCOPY WITH PROPOFOL;  Surgeon: Christene Lye, MD;  Location: [facility];  Service: Endoscopy;  Laterality: N/A;   CYSTOSCOPY W/ RETROGRADES  06/17/2021   Procedure: CYSTOSCOPY WITH RETROGRADE PYELOGRAM;  Surgeon: Billey Co, MD;  Location: [facility];  Service: Urology;;   CYSTOSCOPY W/ URETERAL STENT PLACEMENT Left 02/14/2021   Procedure: CYSTOSCOPY WITH  RETROGRADE PYELOGRAM/URETERAL STENT PLACEMENT;  Surgeon: Billey Co, MD;  Location: [facility];  Service: Urology;  Laterality: Left;   CYSTOSCOPY/URETEROSCOPY/HOLMIUM LASER/STENT PLACEMENT Left 06/17/2021   Procedure: CYSTOSCOPY/URETEROSCOPY/HOLMIUM LASER/STENT EXCHANGE;  Surgeon: Billey Co, MD;  Location: [facility];  Service: Urology;  Laterality: Left;   DIALYSIS/PERMA CATHETER INSERTION N/A 02/24/2021   Procedure: DIALYSIS/PERMA CATHETER INSERTION;  Surgeon: Algernon Huxley, MD;  Location: [place] [facility];  Service: Cardiovascular;  Laterality: N/A;   DIALYSIS/PERMA CATHETER INSERTION N/A 12/16/2021   Procedure: DIALYSIS/PERMA CATHETER INSERTION;  Surgeon: Katha Cabal, MD;  Location: [place] [facility];  Service: Cardiovascular;  Laterality: N/A;   PERIPHERAL VASCULAR THROMBECTOMY Left 09/28/2021   Procedure: PERIPHERAL VASCULAR THROMBECTOMY;  Surgeon: Katha Cabal, MD;  Location: [place] [facility];  Service: Cardiovascular;  Laterality: Left;    Social History   Socioeconomic History   Marital status: Single    Spouse name: Not on file   Number of children: Not on file   Years of education: Not on file   Highest education level: Not on file  Occupational History   Occupation: retired  Tobacco Use   Smoking status: Never    Passive exposure: Never   Smokeless  tobacco: Never  Vaping Use   Vaping Use: Never used  Substance and Sexual Activity   Alcohol use: No    Alcohol/week: 0.0 standard drinks of alcohol   Drug use: No   Sexual activity: Not on file  Other Topics Concern   Not on file  Social History Narrative   Lives in [place] by herself and retired.    Social Determinants of Health   Financial Resource Strain: Unknown (11/15/2017)   Overall Financial Resource Strain (CARDIA)    Difficulty of Paying Living Expenses: Patient refused  Food Insecurity: Unknown (11/15/2017)   Hunger Vital Sign    Worried About Running Out of Food in the Last  Year: Patient refused    [place] in the Last Year: Patient refused  Transportation Needs: Unknown (11/15/2017)   PRAPARE - Hydrologist (Medical): Patient refused    Lack of Transportation (Non-Medical): Patient refused  Physical Activity: Unknown (11/15/2017)   Exercise Vital Sign    Days of Exercise per Week: Patient refused    Minutes of Exercise per Session: Patient refused  Stress: Unknown (11/15/2017)   [place]    Feeling of Stress : Patient refused  Social Connections: Unknown (11/15/2017)   Social Connection and Isolation Panel [NHANES]    Frequency of Communication with Friends and Family: Patient refused    Frequency of Social Gatherings with Friends and Family: Patient refused    Attends Religious Services: Patient refused    Active Member of Clubs or Organizations: Patient refused    Attends Archivist Meetings: Patient refused    Marital Status: Patient refused  Intimate Partner Violence: Unknown (11/15/2017)   Humiliation, Afraid, Rape, and Kick questionnaire    Fear of Current or Ex-Partner: Patient refused    Emotionally Abused: Patient refused    Physically Abused: Patient refused    Sexually Abused: Patient refused    Family History  Problem Relation Age of Onset   Diabetes Father    Hypertension Father    Parkinson's disease Father    Hypertension Mother    Rheum arthritis Mother    Atrial fibrillation Mother    Heart failure Mother    Heart attack Mother        died in her mid-70's.   Colon cancer Cousin 3   Breast cancer Neg Hx      Current Outpatient Medications:    amLODipine (NORVASC) 5 MG tablet, Take 1 tablet (5 mg total) by mouth daily., Disp: 90 tablet, Rfl: 3   apixaban (ELIQUIS) 5 MG TABS tablet, Take 1 tablet (5 mg total) by mouth 2 (two) times daily., Disp: 180 tablet, Rfl: 1   atorvastatin (LIPITOR) 40 MG tablet, Take 40 mg by  mouth daily., Disp: , Rfl:    calcium carbonate (CALCIUM 600) 600 MG TABS tablet, Take 600 mg by mouth daily with breakfast., Disp: , Rfl:    cloNIDine (CATAPRES) 0.2 MG tablet, Take 0.2 mg by mouth 2 (two) times daily., Disp: , Rfl:    CRANBERRY PO, Take 1 capsule by mouth daily., Disp: , Rfl:    doxycycline (VIBRAMYCIN) 100 MG capsule, Take 1 capsule (100 mg total) by mouth 2 (two) times daily., Disp: 28 capsule, Rfl: 0   ergocalciferol (VITAMIN D2) 1.25 MG (50000 UT) capsule, Take 1 capsule (50,000 Units total) by mouth once a week., Disp: 5 capsule, Rfl: 0   ezetimibe (ZETIA) 10 MG tablet, Take  10 mg by mouth daily., Disp: , Rfl:    ferric gluconate 250 mg in sodium chloride 0.9 % 250 mL, Inject 250 mg into the vein Every Tuesday,Thursday,and Saturday with dialysis., Disp: , Rfl:    folic acid (FOLVITE) 1 MG tablet, Take 1 tablet (1 mg total) by mouth daily., Disp: 30 tablet, Rfl: 0   gabapentin (NEURONTIN) 300 MG capsule, Take 1 capsule (300 mg total) by mouth at bedtime., Disp: 30 capsule, Rfl: 0   Lancets (ONETOUCH DELICA PLUS [id number]) MISC, , Disp: , Rfl:    levothyroxine (SYNTHROID) 112 MCG tablet, Take 1 tablet (112 mcg total) by mouth daily before breakfast., Disp: 30 tablet, Rfl: 0   melatonin 5 MG TABS, Take 2 tablets (10 mg total) by mouth at bedtime as needed (sleep)., Disp: 60 tablet, Rfl: 0   metoprolol succinate (TOPROL-XL) 25 MG 24 hr tablet, Take 1 tablet (25 mg total) by mouth daily. Take with or immediately following a meal., Disp: 90 tablet, Rfl: 3   ONETOUCH ULTRA test strip, , Disp: , Rfl:    polyethylene glycol powder (GLYCOLAX/MIRALAX) 17 GM/SCOOP powder, Take 1 Container by mouth once., Disp: , Rfl:    sevelamer carbonate (RENVELA) 800 MG tablet, Take 1 tablet (800 mg total) by mouth 3 (three) times daily with meals., Disp: 90 tablet, Rfl: 0   TRULICITY 1.5 OV/7.8HY SOPN, Inject 1.5 mg into the skin every Monday., Disp: , Rfl:    acetaminophen (TYLENOL) 325 MG tablet,  Take 2 tablets (650 mg total) by mouth every 6 (six) hours as needed for mild pain (or Fever >/= 101). (Patient not taking: Reported on 12/09/2021), Disp: , Rfl:    ADVAIR DISKUS 250-50 MCG/ACT AEPB, Inhale 1 puff into the lungs 2 (two) times daily. (Patient not taking: Reported on 12/29/2021), Disp: , Rfl:    albuterol (VENTOLIN HFA) 108 (90 Base) MCG/ACT inhaler, Inhale 2 puffs into the lungs every 6 (six) hours as needed for wheezing or shortness of breath. (Patient not taking: Reported on 12/29/2021), Disp: 8.5 g, Rfl: 0   calcitRIOL (ROCALTROL) 0.5 MCG capsule, Take 0.5 mcg by mouth daily. (Patient not taking: Reported on 12/29/2021), Disp: , Rfl:    diclofenac Sodium (VOLTAREN) 1 % GEL, Apply 2 g topically 4 (four) times daily. (Patient not taking: Reported on 12/09/2021), Disp: 100 g, Rfl: 0   HYDROcodone-acetaminophen (NORCO) 5-325 MG tablet, Take 1-2 tablets by mouth every 6 (six) hours as needed. (Patient not taking: Reported on 01/09/2022), Disp: 25 tablet, Rfl: 0   HYDROcodone-acetaminophen (NORCO) 5-325 MG tablet, Take 1-2 tablets by mouth every 6 (six) hours as needed for moderate pain or severe pain. (Patient not taking: Reported on 01/09/2022), Disp: 36 tablet, Rfl: 0   midodrine (PROAMATINE) 10 MG tablet, Take 1 tablet (10 mg total) by mouth Every Tuesday,Thursday,and Saturday with dialysis. (Patient not taking: Reported on 01/09/2022), Disp: 30 tablet, Rfl: 0   senna (SENOKOT) 8.6 MG tablet, Take 1 tablet by mouth as needed for constipation. (Patient not taking: Reported on 12/09/2021), Disp: , Rfl:    traMADol (ULTRAM) 50 MG tablet, Take 1 tablet (50 mg total) by mouth every 12 (twelve) hours as needed for moderate pain. (Patient not taking: Reported on 01/09/2022), Disp: 30 tablet, Rfl: 0  Physical exam:  Vitals:   01/09/22 1401  BP: (!) 103/49  Pulse: (!) 101  Resp: 18  SpO2: 95%  Weight: (!) 353 lb 3.2 oz (160.2 kg)   Physical Exam Constitutional:  General: She is not  in acute distress.    Appearance: She is obese.  Cardiovascular:     Rate and Rhythm: Normal rate and regular rhythm.     Heart sounds: Normal heart sounds.  Pulmonary:     Effort: Pulmonary effort is normal.     Breath sounds: Normal breath sounds.  Abdominal:     General: Bowel sounds are normal.     Palpations: Abdomen is soft.  Skin:    General: Skin is warm and dry.  Neurological:     Mental Status: She is alert and oriented to person, place, and time.    Breast exam was performed in seated and lying down position. Patient is status post right lumpectomy with a well-healed surgical scar. No evidence of any palpable masses. No evidence of axillary adenopathy. No evidence of any palpable masses or lumps in the left breast. No evidence of leftt axillary adenopathy      Latest Ref Rng & Units 12/16/2021    8:30 AM  CMP  Glucose 70 - 99 mg/dL 140   BUN 6 - 20 mg/dL 62   Creatinine 0.44 - 1.00 mg/dL 8.99   Sodium 135 - 145 mmol/L 139   Potassium 3.5 - 5.1 mmol/L 5.0   Chloride 98 - 111 mmol/L 98   CO2 22 - 32 mmol/L 28   Calcium 8.9 - 10.3 mg/dL 9.0       Latest Ref Rng & Units 12/16/2021    8:30 AM  CBC  WBC 4.0 - 10.5 K/uL 9.1   Hemoglobin 12.0 - 15.0 g/dL 10.9   Hematocrit 36.0 - 46.0 % 34.7   Platelets 150 - 400 K/uL 227     No images are attached to the encounter.  PERIPHERAL VASCULAR CATHETERIZATION  Result Date: 12/16/2021 See surgical note for result.    Assessment and plan- Patient is a 56 y.o. female with history of breast cancer and pulmonary embolism here for routine follow-up  Surveillance breast cancer: Clinically patientIs doing well with no concerning signs and symptoms of recurrence based on today's exam.  She would be due for a screening mammogram in October 2024 which I will schedule.  Recent mammogram from October 2023 was unremarkable.  History of pulmonary embolism: Given the fact that she has multiple risk factors including poor mobility,  underlying CKD and obesity, plan is to continue Eliquis at this time   Visit Diagnosis 1. Encounter for follow-up surveillance of breast cancer   2. History of pulmonary embolism   3. Chronic anticoagulation      Dr. Randa Evens, MD, MPH [facility] [phone] 01/13/2022 10:04 AM

## 2022-01-14 ENCOUNTER — Encounter (INDEPENDENT_AMBULATORY_CARE_PROVIDER_SITE_OTHER): Payer: Self-pay | Admitting: Nurse Practitioner

## 2022-01-14 NOTE — Progress Notes (Incomplete)
Subjective:    Patient ID: Nicole Schmidt, female    DOB: April 18, 1965, 56 y.o.   MRN: [id number] No chief complaint on file.   HPI  Review of Systems     Objective:   Physical Exam  BP 130/70 (BP Location: Right Arm)   Pulse 92   Resp 18   Ht '5\' 4"'$  (1.626 m)   Wt (!) 346 lb 9.6 oz (157.2 kg)   LMP 07/17/2012   BMI 59.49 kg/m   Past Medical History:  Diagnosis Date  . (HFpEF) heart failure with preserved ejection fraction ([place])    a. 10/2018 Echo: EF 60-65%, diast dysfxn, RVSP 50.7 mmHg; b. 01/2020 Echo: EF 65-70%, G2DD; c. 01/2021 Echo: EF 60-65%, no rwma, mod LVH. Nl RV fxn, mildly enlarged RV. Mod BAE. Triv MR. d. 05/2021 Echo: EF 55-60; no RWMAs, mild LVH, mild TR.  Marland Kitchen Acquired hypothyroidism 02/09/2020  . Adenoma of left adrenal gland   . Anemia   . Aortic atherosclerosis ([place])   . Arthritis   . Breast cancer of upper-inner quadrant of right female breast ([place])    Right breast invasive CA and DCIS , 7 mm T1,N0,M0. Er/PR pos, her 2 negative.  Margins 1 mm.  Marland Kitchen COPD (chronic obstructive pulmonary disease) ([place])   . Diabetes mellitus type 2 in obese ([place]) 02/09/2020  . Difficult intubation 09/28/2021   a.) mallampati III-IV; BMI >55 kg/m  . Diverticulosis   . Dyslipidemia 02/09/2020  . ESBL (extended spectrum beta-lactamase) producing bacteria infection 03/20/2021   a. urine culture (+) for ESBL producing E.coli and P. mirabilis  . ESRD (end stage renal disease) on dialysis ([place])    a. T-Th-Sat HD initiated 02/2021  . Essential hypertension   . Gout   . History of kidney stones   . Long term current use of anticoagulant    a.) apixaban  . Menopause    age 26  . Morbid obesity ([place])   . OSA treated with BiPAP    a. on nocturnal PAP therapy; Trilogy 100 ventilator  . Permanent atrial fibrillation ([place])    a. Dx 10/2018 in setting of PNA. b. CHA2DS2-VASc = 7 (sex, HFpEF, HTN, PE x2, aortic plaque, T2DM); c. s/p successful DCCV 11/2018. d. subsequent recurrent Afib;  rate/rhythm maintained on oral metoprolol succinate; chronically anticoagulated using standard dose apixaban.  . Personal history of radiation therapy   . Pulmonary embolism ([place]) 10/2014   a. on chronic apixaban  . Restrictive lung disease   . Shingles 10/24/2021  . Sleep difficulties    a.) takes melatonin  . Thyroid goiter     Social History   Socioeconomic History  . Marital status: Single    Spouse name: Not on file  . Number of children: Not on file  . Years of education: Not on file  . Highest education level: Not on file  Occupational History  . Occupation: retired  Tobacco Use  . Smoking status: Never    Passive exposure: Never  . Smokeless tobacco: Never  Vaping Use  . Vaping Use: Never used  Substance and Sexual Activity  . Alcohol use: No    Alcohol/week: 0.0 standard drinks of alcohol  . Drug use: No  . Sexual activity: Not on file  Other Topics Concern  . Not on file  Social History Narrative   Lives in [place] by herself and retired.    Social Determinants of Health   Financial Resource Strain: Unknown (11/15/2017)   Overall Financial  Resource Strain (CARDIA)   . Difficulty of Paying Living Expenses: Patient refused  Food Insecurity: Unknown (11/15/2017)   Hunger Vital Sign   . Worried About Charity fundraiser in the Last Year: Patient refused   . Ran Out of Food in the Last Year: Patient refused  Transportation Needs: Unknown (11/15/2017)   [place] - Transportation   . Lack of Transportation (Medical): Patient refused   . Lack of Transportation (Non-Medical): Patient refused  Physical Activity: Unknown (11/15/2017)   Exercise Vital Sign   . Days of Exercise per Week: Patient refused   . Minutes of Exercise per Session: Patient refused  Stress: Unknown (11/15/2017)   [place]   . Feeling of Stress : Patient refused  Social Connections: Unknown (11/15/2017)   Social Connection and  Isolation Panel [NHANES]   . Frequency of Communication with Friends and Family: Patient refused   . Frequency of Social Gatherings with Friends and Family: Patient refused   . Attends Religious Services: Patient refused   . Active Member of Clubs or Organizations: Patient refused   . Attends Archivist Meetings: Patient refused   . Marital Status: Patient refused  Intimate Partner Violence: Unknown (11/15/2017)   Humiliation, Afraid, Rape, and Kick questionnaire   . Fear of Current or Ex-Partner: Patient refused   . Emotionally Abused: Patient refused   . Physically Abused: Patient refused   . Sexually Abused: Patient refused    Past Surgical History:  Procedure Laterality Date  . A/V FISTULAGRAM Left 09/02/2021   Procedure: A/V Fistulagram;  Surgeon: Katha Cabal, MD;  Location: [facility];  Service: Cardiovascular;  Laterality: Left;  . AV FISTULA PLACEMENT Left 07/22/2021   Procedure: ARTERIOVENOUS (AV) FISTULA CREATION (RADIALCEPHALIC);  Surgeon: Katha Cabal, MD;  Location: [facility];  Service: Vascular;  Laterality: Left;  . AV FISTULA PLACEMENT Left 12/09/2021   Procedure: ARTERIOVENOUS (AV) FISTULA CREATION (BRACHIAL CEPHALIC);  Surgeon: Katha Cabal, MD;  Location: [facility];  Service: Vascular;  Laterality: Left;  . BREAST BIOPSY Right 2014   breast ca  . BREAST EXCISIONAL BIOPSY Right 07/19/2012   breast ca  . BREAST SURGERY Right 2014   with sentinel node bx subareaolar duct excision  . CARDIOVERSION N/A 11/28/2018   Procedure: CARDIOVERSION;  Surgeon: Minna Merritts, MD;  Location: [facility];  Service: Cardiovascular;  Laterality: N/A;  . COLONOSCOPY WITH PROPOFOL N/A 01/30/2017   Procedure: COLONOSCOPY WITH PROPOFOL;  Surgeon: Christene Lye, MD;  Location: [facility];  Service: Endoscopy;  Laterality: N/A;  . CYSTOSCOPY W/ RETROGRADES  06/17/2021   Procedure: CYSTOSCOPY WITH RETROGRADE PYELOGRAM;  Surgeon: Billey Co,  MD;  Location: [facility];  Service: Urology;;  . Consuela Mimes W/ URETERAL STENT PLACEMENT Left 02/14/2021   Procedure: CYSTOSCOPY WITH RETROGRADE PYELOGRAM/URETERAL STENT PLACEMENT;  Surgeon: Billey Co, MD;  Location: [facility];  Service: Urology;  Laterality: Left;  . CYSTOSCOPY/URETEROSCOPY/HOLMIUM LASER/STENT PLACEMENT Left 06/17/2021   Procedure: CYSTOSCOPY/URETEROSCOPY/HOLMIUM LASER/STENT EXCHANGE;  Surgeon: Billey Co, MD;  Location: [facility];  Service: Urology;  Laterality: Left;  . DIALYSIS/PERMA CATHETER INSERTION N/A 02/24/2021   Procedure: DIALYSIS/PERMA CATHETER INSERTION;  Surgeon: Algernon Huxley, MD;  Location: [facility];  Service: Cardiovascular;  Laterality: N/A;  . DIALYSIS/PERMA CATHETER INSERTION N/A 12/16/2021   Procedure: DIALYSIS/PERMA CATHETER INSERTION;  Surgeon: Katha Cabal, MD;  Location: [facility];  Service: Cardiovascular;  Laterality: N/A;  .  PERIPHERAL VASCULAR THROMBECTOMY Left 09/28/2021   Procedure: PERIPHERAL VASCULAR THROMBECTOMY;  Surgeon: Katha Cabal, MD;  Location: [facility];  Service: Cardiovascular;  Laterality: Left;    Family History  Problem Relation Age of Onset  . Diabetes Father   . Hypertension Father   . Parkinson's disease Father   . Hypertension Mother   . Rheum arthritis Mother   . Atrial fibrillation Mother   . Heart failure Mother   . Heart attack Mother        died in her mid-70's.  . Colon cancer Cousin 54  . Breast cancer Neg Hx     Allergies  Allergen Reactions  . Spironolactone     Hyperkalemia  Pt unaware of this allergy       Latest Ref Rng & Units 12/16/2021    8:30 AM 12/09/2021    6:23 AM 10/24/2021    2:02 PM  CBC  WBC 4.0 - 10.5 K/uL 9.1   10.1   Hemoglobin 12.0 - 15.0 g/dL 10.9  12.6  11.9   Hematocrit 36.0 - 46.0 % 34.7  37.0  37.2   Platelets 150 - 400 K/uL 227   179       CMP     Component Value Date/Time   NA 139 12/16/2021 0830   NA 144 02/27/2020  0931   NA 141 03/09/2014 1533   K 5.0 12/16/2021 0830   K 4.2 03/09/2014 1533   CL 98 12/16/2021 0830   CL 101 03/09/2014 1533   CO2 28 12/16/2021 0830   CO2 30 03/09/2014 1533   GLUCOSE 140 (H) 12/16/2021 0830   GLUCOSE 95 03/09/2014 1533   BUN 62 (H) 12/16/2021 0830   BUN 40 (H) 02/27/2020 0931   BUN 35 (H) 03/09/2014 1533   CREATININE 8.99 (H) 12/16/2021 0830   CREATININE 1.50 (H) 03/09/2014 1533   CALCIUM 9.0 12/16/2021 0830   CALCIUM 8.8 03/09/2014 1533   PROT 7.5 07/26/2021 2222   PROT 7.3 03/09/2014 1533   ALBUMIN 3.6 07/26/2021 2222   ALBUMIN 3.5 03/09/2014 1533   AST 17 07/26/2021 2222   AST 16 03/09/2014 1533   ALT 6 07/26/2021 2222   ALT 23 03/09/2014 1533   ALKPHOS 162 (H) 07/26/2021 2222   ALKPHOS 113 03/09/2014 1533   BILITOT 0.8 07/26/2021 2222   BILITOT 0.3 03/09/2014 1533   GFRNONAA 5 (L) 12/16/2021 0830   GFRNONAA 39 (L) 03/09/2014 1533   GFRNONAA 55 (L) 07/31/2013 1521   GFRAA 35 (L) 02/27/2020 0931   GFRAA 47 (L) 03/09/2014 1533   GFRAA >60 07/31/2013 1521     No results found.     Assessment & Plan:   1. ESRD (end stage renal disease) ([place]) ***   Current Outpatient Medications on File Prior to Visit  Medication Sig Dispense Refill  . amLODipine (NORVASC) 5 MG tablet Take 1 tablet (5 mg total) by mouth daily. 90 tablet 3  . apixaban (ELIQUIS) 5 MG TABS tablet Take 1 tablet (5 mg total) by mouth 2 (two) times daily. 180 tablet 1  . atorvastatin (LIPITOR) 40 MG tablet Take 40 mg by mouth daily.    . calcium carbonate (CALCIUM 600) 600 MG TABS tablet Take 600 mg by mouth daily with breakfast.    . cloNIDine (CATAPRES) 0.2 MG tablet Take 0.2 mg by mouth 2 (two) times daily.    Marland Kitchen CRANBERRY PO Take 1 capsule by mouth daily.    . ergocalciferol (VITAMIN D2) 1.25  MG (50000 UT) capsule Take 1 capsule (50,000 Units total) by mouth once a week. 5 capsule 0  . ezetimibe (ZETIA) 10 MG tablet Take 10 mg by mouth daily.    . ferric gluconate 250 mg in  sodium chloride 0.9 % 250 mL Inject 250 mg into the vein Every Tuesday,Thursday,and Saturday with dialysis.    . folic acid (FOLVITE) 1 MG tablet Take 1 tablet (1 mg total) by mouth daily. 30 tablet 0  . gabapentin (NEURONTIN) 300 MG capsule Take 1 capsule (300 mg total) by mouth at bedtime. 30 capsule 0  . HYDROcodone-acetaminophen (NORCO) 5-325 MG tablet Take 1-2 tablets by mouth every 6 (six) hours as needed. (Patient not taking: Reported on 01/09/2022) 25 tablet 0  . HYDROcodone-acetaminophen (NORCO) 5-325 MG tablet Take 1-2 tablets by mouth every 6 (six) hours as needed for moderate pain or severe pain. (Patient not taking: Reported on 01/09/2022) 36 tablet 0  . Lancets (ONETOUCH DELICA PLUS [id number]) MISC     . levothyroxine (SYNTHROID) 112 MCG tablet Take 1 tablet (112 mcg total) by mouth daily before breakfast. 30 tablet 0  . melatonin 5 MG TABS Take 2 tablets (10 mg total) by mouth at bedtime as needed (sleep). 60 tablet 0  . metoprolol succinate (TOPROL-XL) 25 MG 24 hr tablet Take 1 tablet (25 mg total) by mouth daily. Take with or immediately following a meal. 90 tablet 3  . midodrine (PROAMATINE) 10 MG tablet Take 1 tablet (10 mg total) by mouth Every Tuesday,Thursday,and Saturday with dialysis. 30 tablet 0  . ONETOUCH ULTRA test strip     . sevelamer carbonate (RENVELA) 800 MG tablet Take 1 tablet (800 mg total) by mouth 3 (three) times daily with meals. 90 tablet 0  . traMADol (ULTRAM) 50 MG tablet Take 1 tablet (50 mg total) by mouth every 12 (twelve) hours as needed for moderate pain. (Patient not taking: Reported on 01/09/2022) 30 tablet 0  . TRULICITY 1.5 YH/0.6CB SOPN Inject 1.5 mg into the skin every Monday.    Marland Kitchen acetaminophen (TYLENOL) 325 MG tablet Take 2 tablets (650 mg total) by mouth every 6 (six) hours as needed for mild pain (or Fever >/= 101). (Patient not taking: Reported on 12/09/2021)    . ADVAIR DISKUS 250-50 MCG/ACT AEPB Inhale 1 puff into the lungs 2 (two) times  daily. (Patient not taking: Reported on 12/29/2021)    . albuterol (VENTOLIN HFA) 108 (90 Base) MCG/ACT inhaler Inhale 2 puffs into the lungs every 6 (six) hours as needed for wheezing or shortness of breath. (Patient not taking: Reported on 12/29/2021) 8.5 g 0  . calcitRIOL (ROCALTROL) 0.5 MCG capsule Take 0.5 mcg by mouth daily. (Patient not taking: Reported on 12/29/2021)    . diclofenac Sodium (VOLTAREN) 1 % GEL Apply 2 g topically 4 (four) times daily. (Patient not taking: Reported on 12/09/2021) 100 g 0  . senna (SENOKOT) 8.6 MG tablet Take 1 tablet by mouth as needed for constipation. (Patient not taking: Reported on 12/09/2021)     No current facility-administered medications on file prior to visit.    There are no Patient Instructions on file for this visit. No follow-ups on file.   Kris Hartmann, NP

## 2022-01-14 NOTE — Progress Notes (Signed)
Subjective:    Patient ID: Nicole Schmidt, female    DOB: 1965-02-22, 56 y.o.   MRN: [id number] No chief complaint on file.   Today with small wound dehiscence following left brachiocephalic AV fistula placement.  There is no draining and no exposed fistula.  The wound dehiscence is fairly shallow.    Review of Systems  Skin:  Positive for wound.  All other systems reviewed and are negative.      Objective:   Physical Exam Vitals reviewed.  HENT:     Head: Normocephalic.  Cardiovascular:     Rate and Rhythm: Normal rate.  Pulmonary:     Effort: Pulmonary effort is normal.  Skin:    General: Skin is warm and dry.  Neurological:     Mental Status: She is alert and oriented to person, place, and time.  Psychiatric:        Mood and Affect: Mood normal.        Behavior: Behavior normal.        Thought Content: Thought content normal.        Judgment: Judgment normal.     BP 130/70 (BP Location: Right Arm)   Pulse 92   Resp 18   Ht '5\' 4"'$  (1.626 m)   Wt (!) 346 lb 9.6 oz (157.2 kg)   LMP 07/17/2012   BMI 59.49 kg/m   Past Medical History:  Diagnosis Date   (HFpEF) heart failure with preserved ejection fraction ([place])    a. 10/2018 Echo: EF 60-65%, diast dysfxn, RVSP 50.7 mmHg; b. 01/2020 Echo: EF 65-70%, G2DD; c. 01/2021 Echo: EF 60-65%, no rwma, mod LVH. Nl RV fxn, mildly enlarged RV. Mod BAE. Triv MR. d. 05/2021 Echo: EF 55-60; no RWMAs, mild LVH, mild TR.   Acquired hypothyroidism 02/09/2020   Adenoma of left adrenal gland    Anemia    Aortic atherosclerosis (HCC)    Arthritis    Breast cancer of upper-inner quadrant of right female breast (Nicole Schmidt)    Right breast invasive CA and DCIS , 7 mm T1,N0,M0. Er/PR pos, her 2 negative.  Margins 1 mm.   COPD (chronic obstructive pulmonary disease) (HCC)    Diabetes mellitus type 2 in obese (Nicole Schmidt) 02/09/2020   Difficult intubation 09/28/2021   a.) mallampati III-IV; BMI >55 kg/m   Diverticulosis    Dyslipidemia 02/09/2020    ESBL (extended spectrum beta-lactamase) producing bacteria infection 03/20/2021   a. urine culture (+) for ESBL producing E.coli and P. mirabilis   ESRD (end stage renal disease) on dialysis ([place])    a. T-Th-Sat HD initiated 02/2021   Essential hypertension    Gout    History of kidney stones    Long term current use of anticoagulant    a.) apixaban   Menopause    age 42   Morbid obesity (Nicole Schmidt)    OSA treated with BiPAP    a. on nocturnal PAP therapy; Trilogy 100 ventilator   Permanent atrial fibrillation (Nicole Schmidt)    a. Dx 10/2018 in setting of PNA. b. CHA2DS2-VASc = 7 (sex, HFpEF, HTN, PE x2, aortic plaque, T2DM); c. s/p successful DCCV 11/2018. d. subsequent recurrent Afib; rate/rhythm maintained on oral metoprolol succinate; chronically anticoagulated using standard dose apixaban.   Personal history of radiation therapy    Pulmonary embolism (Nicole Schmidt) 10/2014   a. on chronic apixaban   Restrictive lung disease    Shingles 10/24/2021   Sleep difficulties    a.) takes melatonin   Thyroid goiter  Social History   Socioeconomic History   Marital status: Single    Spouse name: Not on file   Number of children: Not on file   Years of education: Not on file   Highest education level: Not on file  Occupational History   Occupation: retired  Tobacco Use   Smoking status: Never    Passive exposure: Never   Smokeless tobacco: Never  Vaping Use   Vaping Use: Never used  Substance and Sexual Activity   Alcohol use: No    Alcohol/week: 0.0 standard drinks of alcohol   Drug use: No   Sexual activity: Not on file  Other Topics Concern   Not on file  Social History Narrative   Lives in [place] by herself and retired.    Social Determinants of Health   Financial Resource Strain: Unknown (11/15/2017)   Overall Financial Resource Strain (CARDIA)    Difficulty of Paying Living Expenses: Patient refused  Food Insecurity: Unknown (11/15/2017)   Hunger Vital Sign    Worried About  Running Out of Food in the Last Year: Patient refused    [place] in the Last Year: Patient refused  Transportation Needs: Unknown (11/15/2017)   [place] - Hydrologist (Medical): Patient refused    Lack of Transportation (Non-Medical): Patient refused  Physical Activity: Unknown (11/15/2017)   Exercise Vital Sign    Days of Exercise per Week: Patient refused    Minutes of Exercise per Session: Patient refused  Stress: Unknown (11/15/2017)   [place]    Feeling of Stress : Patient refused  Social Connections: Unknown (11/15/2017)   Social Connection and Isolation Panel [NHANES]    Frequency of Communication with Friends and Family: Patient refused    Frequency of Social Gatherings with Friends and Family: Patient refused    Attends Religious Services: Patient refused    Active Member of Clubs or Organizations: Patient refused    Attends Archivist Meetings: Patient refused    Marital Status: Patient refused  Intimate Partner Violence: Unknown (11/15/2017)   Humiliation, Afraid, Rape, and Kick questionnaire    Fear of Current or Ex-Partner: Patient refused    Emotionally Abused: Patient refused    Physically Abused: Patient refused    Sexually Abused: Patient refused    Past Surgical History:  Procedure Laterality Date   A/V FISTULAGRAM Left 09/02/2021   Procedure: A/V Fistulagram;  Surgeon: Katha Cabal, MD;  Location: [facility];  Service: Cardiovascular;  Laterality: Left;   AV FISTULA PLACEMENT Left 07/22/2021   Procedure: ARTERIOVENOUS (AV) FISTULA CREATION (RADIALCEPHALIC);  Surgeon: Katha Cabal, MD;  Location: [facility];  Service: Vascular;  Laterality: Left;   AV FISTULA PLACEMENT Left 12/09/2021   Procedure: ARTERIOVENOUS (AV) FISTULA CREATION (BRACHIAL CEPHALIC);  Surgeon: Katha Cabal, MD;  Location: [facility];  Service: Vascular;   Laterality: Left;   BREAST BIOPSY Right 2014   breast ca   BREAST EXCISIONAL BIOPSY Right 07/19/2012   breast ca   BREAST SURGERY Right 2014   with sentinel node bx subareaolar duct excision   CARDIOVERSION N/A 11/28/2018   Procedure: CARDIOVERSION;  Surgeon: Minna Merritts, MD;  Location: [facility];  Service: Cardiovascular;  Laterality: N/A;   COLONOSCOPY WITH PROPOFOL N/A 01/30/2017   Procedure: COLONOSCOPY WITH PROPOFOL;  Surgeon: Christene Lye, MD;  Location: [facility];  Service: Endoscopy;  Laterality: N/A;   CYSTOSCOPY W/ RETROGRADES  06/17/2021   Procedure: CYSTOSCOPY WITH RETROGRADE PYELOGRAM;  Surgeon: Billey Co, MD;  Location: [facility];  Service: Urology;;   CYSTOSCOPY W/ URETERAL STENT PLACEMENT Left 02/14/2021   Procedure: CYSTOSCOPY WITH RETROGRADE PYELOGRAM/URETERAL STENT PLACEMENT;  Surgeon: Billey Co, MD;  Location: [facility];  Service: Urology;  Laterality: Left;   CYSTOSCOPY/URETEROSCOPY/HOLMIUM LASER/STENT PLACEMENT Left 06/17/2021   Procedure: CYSTOSCOPY/URETEROSCOPY/HOLMIUM LASER/STENT EXCHANGE;  Surgeon: Billey Co, MD;  Location: [facility];  Service: Urology;  Laterality: Left;   DIALYSIS/PERMA CATHETER INSERTION N/A 02/24/2021   Procedure: DIALYSIS/PERMA CATHETER INSERTION;  Surgeon: Algernon Huxley, MD;  Location: [facility];  Service: Cardiovascular;  Laterality: N/A;   DIALYSIS/PERMA CATHETER INSERTION N/A 12/16/2021   Procedure: DIALYSIS/PERMA CATHETER INSERTION;  Surgeon: Katha Cabal, MD;  Location: [facility];  Service: Cardiovascular;  Laterality: N/A;   PERIPHERAL VASCULAR THROMBECTOMY Left 09/28/2021   Procedure: PERIPHERAL VASCULAR THROMBECTOMY;  Surgeon: Katha Cabal, MD;  Location: [facility];  Service: Cardiovascular;  Laterality: Left;    Family History  Problem Relation Age of Onset   Diabetes Father    Hypertension Father    Parkinson's disease Father    Hypertension Mother     Rheum arthritis Mother    Atrial fibrillation Mother    Heart failure Mother    Heart attack Mother        died in her mid-70's.   Colon cancer Cousin 54   Breast cancer Neg Hx     Allergies  Allergen Reactions   Spironolactone     Hyperkalemia  Pt unaware of this allergy       Latest Ref Rng & Units 12/16/2021    8:30 AM 12/09/2021    6:23 AM 10/24/2021    2:02 PM  CBC  WBC 4.0 - 10.5 K/uL 9.1   10.1   Hemoglobin 12.0 - 15.0 g/dL 10.9  12.6  11.9   Hematocrit 36.0 - 46.0 % 34.7  37.0  37.2   Platelets 150 - 400 K/uL 227   179       CMP     Component Value Date/Time   NA 139 12/16/2021 0830   NA 144 02/27/2020 0931   NA 141 03/09/2014 1533   K 5.0 12/16/2021 0830   K 4.2 03/09/2014 1533   CL 98 12/16/2021 0830   CL 101 03/09/2014 1533   CO2 28 12/16/2021 0830   CO2 30 03/09/2014 1533   GLUCOSE 140 (H) 12/16/2021 0830   GLUCOSE 95 03/09/2014 1533   BUN 62 (H) 12/16/2021 0830   BUN 40 (H) 02/27/2020 0931   BUN 35 (H) 03/09/2014 1533   CREATININE 8.99 (H) 12/16/2021 0830   CREATININE 1.50 (H) 03/09/2014 1533   CALCIUM 9.0 12/16/2021 0830   CALCIUM 8.8 03/09/2014 1533   PROT 7.5 07/26/2021 2222   PROT 7.3 03/09/2014 1533   ALBUMIN 3.6 07/26/2021 2222   ALBUMIN 3.5 03/09/2014 1533   AST 17 07/26/2021 2222   AST 16 03/09/2014 1533   ALT 6 07/26/2021 2222   ALT 23 03/09/2014 1533   ALKPHOS 162 (H) 07/26/2021 2222   ALKPHOS 113 03/09/2014 1533   BILITOT 0.8 07/26/2021 2222   BILITOT 0.3 03/09/2014 1533   GFRNONAA 5 (L) 12/16/2021 0830   GFRNONAA 39 (L) 03/09/2014 1533   GFRNONAA 55 (L) 07/31/2013 1521   GFRAA 35 (L) 02/27/2020 0931   GFRAA 47 (L) 03/09/2014 1533   GFRAA >60 07/31/2013 1521  No results found.     Assessment & Plan:   1. ESRD (end stage renal disease) ([place]) Patient wound closed with zip stitch dressing.  Dry dressing applied.  We will have patient return in approximately 2 weeks for wound reevaluation.   Current Outpatient  Medications on File Prior to Visit  Medication Sig Dispense Refill   amLODipine (NORVASC) 5 MG tablet Take 1 tablet (5 mg total) by mouth daily. 90 tablet 3   apixaban (ELIQUIS) 5 MG TABS tablet Take 1 tablet (5 mg total) by mouth 2 (two) times daily. 180 tablet 1   atorvastatin (LIPITOR) 40 MG tablet Take 40 mg by mouth daily.     calcium carbonate (CALCIUM 600) 600 MG TABS tablet Take 600 mg by mouth daily with breakfast.     cloNIDine (CATAPRES) 0.2 MG tablet Take 0.2 mg by mouth 2 (two) times daily.     CRANBERRY PO Take 1 capsule by mouth daily.     ergocalciferol (VITAMIN D2) 1.25 MG (50000 UT) capsule Take 1 capsule (50,000 Units total) by mouth once a week. 5 capsule 0   ezetimibe (ZETIA) 10 MG tablet Take 10 mg by mouth daily.     ferric gluconate 250 mg in sodium chloride 0.9 % 250 mL Inject 250 mg into the vein Every Tuesday,Thursday,and Saturday with dialysis.     folic acid (FOLVITE) 1 MG tablet Take 1 tablet (1 mg total) by mouth daily. 30 tablet 0   gabapentin (NEURONTIN) 300 MG capsule Take 1 capsule (300 mg total) by mouth at bedtime. 30 capsule 0   HYDROcodone-acetaminophen (NORCO) 5-325 MG tablet Take 1-2 tablets by mouth every 6 (six) hours as needed. (Patient not taking: Reported on 01/09/2022) 25 tablet 0   HYDROcodone-acetaminophen (NORCO) 5-325 MG tablet Take 1-2 tablets by mouth every 6 (six) hours as needed for moderate pain or severe pain. (Patient not taking: Reported on 01/09/2022) 36 tablet 0   Lancets (ONETOUCH DELICA PLUS [id number]) MISC      levothyroxine (SYNTHROID) 112 MCG tablet Take 1 tablet (112 mcg total) by mouth daily before breakfast. 30 tablet 0   melatonin 5 MG TABS Take 2 tablets (10 mg total) by mouth at bedtime as needed (sleep). 60 tablet 0   metoprolol succinate (TOPROL-XL) 25 MG 24 hr tablet Take 1 tablet (25 mg total) by mouth daily. Take with or immediately following a meal. 90 tablet 3   midodrine (PROAMATINE) 10 MG tablet Take 1 tablet (10 mg  total) by mouth Every Tuesday,Thursday,and Saturday with dialysis. 30 tablet 0   ONETOUCH ULTRA test strip      sevelamer carbonate (RENVELA) 800 MG tablet Take 1 tablet (800 mg total) by mouth 3 (three) times daily with meals. 90 tablet 0   traMADol (ULTRAM) 50 MG tablet Take 1 tablet (50 mg total) by mouth every 12 (twelve) hours as needed for moderate pain. (Patient not taking: Reported on 01/09/2022) 30 tablet 0   TRULICITY 1.5 BO/1.7PZ SOPN Inject 1.5 mg into the skin every Monday.     acetaminophen (TYLENOL) 325 MG tablet Take 2 tablets (650 mg total) by mouth every 6 (six) hours as needed for mild pain (or Fever >/= 101). (Patient not taking: Reported on 12/09/2021)     ADVAIR DISKUS 250-50 MCG/ACT AEPB Inhale 1 puff into the lungs 2 (two) times daily. (Patient not taking: Reported on 12/29/2021)     albuterol (VENTOLIN HFA) 108 (90 Base) MCG/ACT inhaler Inhale 2 puffs into the lungs every 6 (  six) hours as needed for wheezing or shortness of breath. (Patient not taking: Reported on 12/29/2021) 8.5 g 0   calcitRIOL (ROCALTROL) 0.5 MCG capsule Take 0.5 mcg by mouth daily. (Patient not taking: Reported on 12/29/2021)     diclofenac Sodium (VOLTAREN) 1 % GEL Apply 2 g topically 4 (four) times daily. (Patient not taking: Reported on 12/09/2021) 100 g 0   senna (SENOKOT) 8.6 MG tablet Take 1 tablet by mouth as needed for constipation. (Patient not taking: Reported on 12/09/2021)     No current facility-administered medications on file prior to visit.    There are no Patient Instructions on file for this visit. No follow-ups on file.   Kris Hartmann, NP

## 2022-01-14 NOTE — Progress Notes (Signed)
MRN : [id number]  Nicole Schmidt is a 56 y.o. (15-Apr-1965) female who presents with chief complaint of check access.  History of Present Illness:   The patient returns to the office for followup of their dialysis access.   She is s/p left brachial cephalic fistula creation on 12/09/2021.  Currently she is using her catheter for HD.  The patient denies hand pain or other symptoms consistent with steal phenomena.  No significant arm swelling.  The patient denies any complaints from the dialysis center or their nephrologist.  The patient denies redness or swelling at the access site. The patient denies fever or chills at home or while on dialysis.  Duplex ultrasound of the AV access shows a patent access.  The previously noted stenosis is not significantly changed compared to last study.  Flow volume today is 1080 cc/min    No outpatient medications have been marked as taking for the 01/16/22 encounter (Appointment) with Delana Meyer, Dolores Lory, MD.    Past Medical History:  Diagnosis Date   (HFpEF) heart failure with preserved ejection fraction ([place])    a. 10/2018 Echo: EF 60-65%, diast dysfxn, RVSP 50.7 mmHg; b. 01/2020 Echo: EF 65-70%, G2DD; c. 01/2021 Echo: EF 60-65%, no rwma, mod LVH. Nl RV fxn, mildly enlarged RV. Mod BAE. Triv MR. d. 05/2021 Echo: EF 55-60; no RWMAs, mild LVH, mild TR.   Acquired hypothyroidism 02/09/2020   Adenoma of left adrenal gland    Anemia    Aortic atherosclerosis (HCC)    Arthritis    Breast cancer of upper-inner quadrant of right female breast ([place])    Right breast invasive CA and DCIS , 7 mm T1,N0,M0. Er/PR pos, her 2 negative.  Margins 1 mm.   COPD (chronic obstructive pulmonary disease) (HCC)    Diabetes mellitus type 2 in obese ([place]) 02/09/2020   Difficult intubation 09/28/2021   a.) mallampati III-IV; BMI >55 kg/m   Diverticulosis    Dyslipidemia 02/09/2020   ESBL (extended spectrum beta-lactamase) producing bacteria  infection 03/20/2021   a. urine culture (+) for ESBL producing E.coli and P. mirabilis   ESRD (end stage renal disease) on dialysis ([place])    a. T-Th-Sat HD initiated 02/2021   Essential hypertension    Gout    History of kidney stones    Long term current use of anticoagulant    a.) apixaban   Menopause    age 75   Morbid obesity ([place])    OSA treated with BiPAP    a. on nocturnal PAP therapy; Trilogy 100 ventilator   Permanent atrial fibrillation ([place])    a. Dx 10/2018 in setting of PNA. b. CHA2DS2-VASc = 7 (sex, HFpEF, HTN, PE x2, aortic plaque, T2DM); c. s/p successful DCCV 11/2018. d. subsequent recurrent Afib; rate/rhythm maintained on oral metoprolol succinate; chronically anticoagulated using standard dose apixaban.   Personal history of radiation therapy    Pulmonary embolism ([place]) 10/2014   a. on chronic apixaban   Restrictive lung disease    Shingles 10/24/2021   Sleep difficulties    a.) takes melatonin   Thyroid goiter     Past Surgical History:  Procedure Laterality Date   A/V FISTULAGRAM Left 09/02/2021   Procedure: A/V Fistulagram;  Surgeon: Katha Cabal, MD;  Location: [facility];  Service: Cardiovascular;  Laterality: Left;   AV FISTULA PLACEMENT Left  07/22/2021   Procedure: ARTERIOVENOUS (AV) FISTULA CREATION (RADIALCEPHALIC);  Surgeon: Katha Cabal, MD;  Location: [facility];  Service: Vascular;  Laterality: Left;   AV FISTULA PLACEMENT Left 12/09/2021   Procedure: ARTERIOVENOUS (AV) FISTULA CREATION (BRACHIAL CEPHALIC);  Surgeon: Katha Cabal, MD;  Location: [facility];  Service: Vascular;  Laterality: Left;   BREAST BIOPSY Right 2014   breast ca   BREAST EXCISIONAL BIOPSY Right 07/19/2012   breast ca   BREAST SURGERY Right 2014   with sentinel node bx subareaolar duct excision   CARDIOVERSION N/A 11/28/2018   Procedure: CARDIOVERSION;  Surgeon: Minna Merritts, MD;  Location: [facility];  Service: Cardiovascular;  Laterality: N/A;    COLONOSCOPY WITH PROPOFOL N/A 01/30/2017   Procedure: COLONOSCOPY WITH PROPOFOL;  Surgeon: Christene Lye, MD;  Location: [facility];  Service: Endoscopy;  Laterality: N/A;   CYSTOSCOPY W/ RETROGRADES  06/17/2021   Procedure: CYSTOSCOPY WITH RETROGRADE PYELOGRAM;  Surgeon: Billey Co, MD;  Location: [facility];  Service: Urology;;   CYSTOSCOPY W/ URETERAL STENT PLACEMENT Left 02/14/2021   Procedure: CYSTOSCOPY WITH RETROGRADE PYELOGRAM/URETERAL STENT PLACEMENT;  Surgeon: Billey Co, MD;  Location: [facility];  Service: Urology;  Laterality: Left;   CYSTOSCOPY/URETEROSCOPY/HOLMIUM LASER/STENT PLACEMENT Left 06/17/2021   Procedure: CYSTOSCOPY/URETEROSCOPY/HOLMIUM LASER/STENT EXCHANGE;  Surgeon: Billey Co, MD;  Location: [facility];  Service: Urology;  Laterality: Left;   DIALYSIS/PERMA CATHETER INSERTION N/A 02/24/2021   Procedure: DIALYSIS/PERMA CATHETER INSERTION;  Surgeon: Algernon Huxley, MD;  Location: [facility];  Service: Cardiovascular;  Laterality: N/A;   DIALYSIS/PERMA CATHETER INSERTION N/A 12/16/2021   Procedure: DIALYSIS/PERMA CATHETER INSERTION;  Surgeon: Katha Cabal, MD;  Location: [facility];  Service: Cardiovascular;  Laterality: N/A;   PERIPHERAL VASCULAR THROMBECTOMY Left 09/28/2021   Procedure: PERIPHERAL VASCULAR THROMBECTOMY;  Surgeon: Katha Cabal, MD;  Location: [facility];  Service: Cardiovascular;  Laterality: Left;    Social History Social History   Tobacco Use   Smoking status: Never    Passive exposure: Never   Smokeless tobacco: Never  Vaping Use   Vaping Use: Never used  Substance Use Topics   Alcohol use: No    Alcohol/week: 0.0 standard drinks of alcohol   Drug use: No    Family History Family History  Problem Relation Age of Onset   Diabetes Father    Hypertension Father    Parkinson's disease Father    Hypertension Mother    Rheum arthritis Mother    Atrial fibrillation Mother    Heart  failure Mother    Heart attack Mother        died in her mid-70's.   Colon cancer Cousin 54   Breast cancer Neg Hx     Allergies  Allergen Reactions   Spironolactone     Hyperkalemia  Pt unaware of this allergy     REVIEW OF SYSTEMS (Negative unless checked)  Constitutional: '[]'$ Weight loss  '[]'$ Fever  '[]'$ Chills Cardiac: '[]'$ Chest pain   '[]'$ Chest pressure   '[]'$ Palpitations   '[]'$ Shortness of breath when laying flat   '[]'$ Shortness of breath with exertion. Vascular:  '[]'$ Pain in legs with walking   '[]'$ Pain in legs at rest  '[]'$ History of DVT   '[]'$ Phlebitis   '[]'$ Swelling in legs   '[]'$ Varicose veins   '[]'$ Non-healing ulcers Pulmonary:   '[]'$ Uses home oxygen   '[]'$ Productive cough   '[]'$ Hemoptysis   '[]'$ Wheeze  '[]'$ COPD   '[]'$ Asthma Neurologic:  '[]'$ Dizziness   '[]'$ Seizures   '[]'$ History  of stroke   '[]'$ History of TIA  '[]'$ Aphasia   '[]'$ Vissual changes   '[]'$ Weakness or numbness in arm   '[]'$ Weakness or numbness in leg Musculoskeletal:   '[]'$ Joint swelling   '[]'$ Joint pain   '[]'$ Low back pain Hematologic:  '[]'$ Easy bruising  '[]'$ Easy bleeding   '[]'$ Hypercoagulable state   '[]'$ Anemic Gastrointestinal:  '[]'$ Diarrhea   '[]'$ Vomiting  '[]'$ Gastroesophageal reflux/heartburn   '[]'$ Difficulty swallowing. Genitourinary:  '[x]'$ Chronic kidney disease   '[]'$ Difficult urination  '[]'$ Frequent urination   '[]'$ Blood in urine Skin:  '[]'$ Rashes   '[]'$ Ulcers  Psychological:  '[]'$ History of anxiety   '[]'$  History of major depression.  Physical Examination  There were no vitals filed for this visit. There is no height or weight on file to calculate BMI. Gen: WD/WN, NAD Head: [place]/AT, No temporalis wasting.  Ear/Nose/Throat: Hearing grossly intact, nares w/o erythema or drainage Eyes: PER, EOMI, sclera nonicteric.  Neck: Supple, no gross masses or lesions.  No JVD.  Pulmonary:  Good air movement, no audible wheezing, no use of accessory muscles.  Cardiac: RRR, precordium non-hyperdynamic. Vascular:   Good thrill good bruit  Vessel Right Left  Radial Palpable Palpable  Brachial Palpable  Palpable  Gastrointestinal: soft, non-distended. No guarding/no peritoneal signs.  Musculoskeletal: M/S 5/5 throughout.  No deformity.  Neurologic: CN 2-12 intact. Pain and light touch intact in extremities.  Symmetrical.  Speech is fluent. Motor exam as listed above. Psychiatric: Judgment intact, Mood & affect appropriate for pt's clinical situation. Dermatologic: No rashes or ulcers noted.  No changes consistent with cellulitis.   CBC Lab Results  Component Value Date   WBC 9.1 12/16/2021   HGB 10.9 (L) 12/16/2021   HCT 34.7 (L) 12/16/2021   MCV 107.8 (H) 12/16/2021   PLT 227 12/16/2021    BMET    Component Value Date/Time   NA 139 12/16/2021 0830   NA 144 02/27/2020 0931   NA 141 03/09/2014 1533   K 5.0 12/16/2021 0830   K 4.2 03/09/2014 1533   CL 98 12/16/2021 0830   CL 101 03/09/2014 1533   CO2 28 12/16/2021 0830   CO2 30 03/09/2014 1533   GLUCOSE 140 (H) 12/16/2021 0830   GLUCOSE 95 03/09/2014 1533   BUN 62 (H) 12/16/2021 0830   BUN 40 (H) 02/27/2020 0931   BUN 35 (H) 03/09/2014 1533   CREATININE 8.99 (H) 12/16/2021 0830   CREATININE 1.50 (H) 03/09/2014 1533   CALCIUM 9.0 12/16/2021 0830   CALCIUM 8.8 03/09/2014 1533   GFRNONAA 5 (L) 12/16/2021 0830   GFRNONAA 39 (L) 03/09/2014 1533   GFRNONAA 55 (L) 07/31/2013 1521   GFRAA 35 (L) 02/27/2020 0931   GFRAA 47 (L) 03/09/2014 1533   GFRAA >60 07/31/2013 1521   CrCl cannot be calculated (Patient's most recent lab result is older than the maximum 21 days allowed.).  COAG Lab Results  Component Value Date   INR 1.1 02/15/2021   INR 1.35 01/12/2018   INR 1.27 11/16/2017    Radiology PERIPHERAL VASCULAR CATHETERIZATION  Result Date: 12/16/2021 See surgical note for result.    Assessment/Plan 1. ESRD (end stage renal disease) ([place]) Recommend:  The patient is doing well and currently has adequate dialysis access. The patient's dialysis center is not reporting any access issues. Flow pattern is stable  when compared to the prior ultrasound.  The patient should have a duplex ultrasound of the dialysis access in 6 weeks so we can plan to elevate the vein.  The patient will follow-up with me in the office  after each ultrasound   - VAS US DUPLEX DIALYSIS ACCESS (AVF, AVG) - VAS US DUPLEX DIALYSIS ACCESS (AVF, AVG); Future  2. Atrial fibrillation, chronic (HCC) Continue antiarrhythmia medications as already ordered, these medications have been reviewed and there are no changes at this time.  Continue anticoagulation as ordered by Cardiology Service  3. Essential hypertension Continue antihypertensive medications as already ordered, these medications have been reviewed and there are no changes at this time.  4. Diabetes mellitus type 2 in obese ([place]) Continue hypoglycemic medications as already ordered, these medications have been reviewed and there are no changes at this time.  Hgb A1C to be monitored as already arranged by primary service  5. S/P arteriovenous (AV) fistula creation See #1 - VAS US DUPLEX DIALYSIS ACCESS (AVF, AVG)    [name], MD  01/14/2022 3:43 PM

## 2022-01-16 ENCOUNTER — Ambulatory Visit (INDEPENDENT_AMBULATORY_CARE_PROVIDER_SITE_OTHER): Payer: BC Managed Care – PPO | Admitting: Vascular Surgery

## 2022-01-16 ENCOUNTER — Ambulatory Visit (INDEPENDENT_AMBULATORY_CARE_PROVIDER_SITE_OTHER): Payer: BC Managed Care – PPO

## 2022-01-16 ENCOUNTER — Other Ambulatory Visit (INDEPENDENT_AMBULATORY_CARE_PROVIDER_SITE_OTHER): Payer: Self-pay | Admitting: Vascular Surgery

## 2022-01-16 ENCOUNTER — Encounter (INDEPENDENT_AMBULATORY_CARE_PROVIDER_SITE_OTHER): Payer: Self-pay | Admitting: Vascular Surgery

## 2022-01-16 VITALS — BP 103/55 | HR 96 | Resp 18 | Ht 64.0 in | Wt 349.0 lb

## 2022-01-16 DIAGNOSIS — Z9889 Other specified postprocedural states: Secondary | ICD-10-CM

## 2022-01-16 DIAGNOSIS — I482 Chronic atrial fibrillation, unspecified: Secondary | ICD-10-CM

## 2022-01-16 DIAGNOSIS — N186 End stage renal disease: Secondary | ICD-10-CM | POA: Diagnosis not present

## 2022-01-16 DIAGNOSIS — E669 Obesity, unspecified: Secondary | ICD-10-CM

## 2022-01-16 DIAGNOSIS — E1169 Type 2 diabetes mellitus with other specified complication: Secondary | ICD-10-CM

## 2022-01-16 DIAGNOSIS — I10 Essential (primary) hypertension: Secondary | ICD-10-CM

## 2022-01-18 DIAGNOSIS — E662 Morbid (severe) obesity with alveolar hypoventilation: Secondary | ICD-10-CM | POA: Diagnosis not present

## 2022-01-18 NOTE — Addendum Note (Signed)
Addended by: James Ivanoff D on: 01/18/2022 12:39 PM   Modules accepted: Orders

## 2022-01-22 ENCOUNTER — Encounter (INDEPENDENT_AMBULATORY_CARE_PROVIDER_SITE_OTHER): Payer: Self-pay | Admitting: Vascular Surgery

## 2022-01-22 DIAGNOSIS — J9601 Acute respiratory failure with hypoxia: Secondary | ICD-10-CM | POA: Diagnosis not present

## 2022-01-22 DIAGNOSIS — I482 Chronic atrial fibrillation, unspecified: Secondary | ICD-10-CM | POA: Diagnosis not present

## 2022-01-22 DIAGNOSIS — I5032 Chronic diastolic (congestive) heart failure: Secondary | ICD-10-CM | POA: Diagnosis not present

## 2022-01-22 DIAGNOSIS — G4733 Obstructive sleep apnea (adult) (pediatric): Secondary | ICD-10-CM | POA: Diagnosis not present

## 2022-01-25 DIAGNOSIS — I10 Essential (primary) hypertension: Secondary | ICD-10-CM | POA: Diagnosis not present

## 2022-01-25 DIAGNOSIS — D352 Benign neoplasm of pituitary gland: Secondary | ICD-10-CM | POA: Diagnosis not present

## 2022-01-25 DIAGNOSIS — E559 Vitamin D deficiency, unspecified: Secondary | ICD-10-CM | POA: Diagnosis not present

## 2022-01-25 DIAGNOSIS — E1165 Type 2 diabetes mellitus with hyperglycemia: Secondary | ICD-10-CM | POA: Diagnosis not present

## 2022-01-25 DIAGNOSIS — I5043 Acute on chronic combined systolic (congestive) and diastolic (congestive) heart failure: Secondary | ICD-10-CM | POA: Diagnosis not present

## 2022-01-25 DIAGNOSIS — E039 Hypothyroidism, unspecified: Secondary | ICD-10-CM | POA: Diagnosis not present

## 2022-01-25 DIAGNOSIS — E785 Hyperlipidemia, unspecified: Secondary | ICD-10-CM | POA: Diagnosis not present

## 2022-01-30 DIAGNOSIS — I5043 Acute on chronic combined systolic (congestive) and diastolic (congestive) heart failure: Secondary | ICD-10-CM | POA: Diagnosis not present

## 2022-01-30 DIAGNOSIS — I10 Essential (primary) hypertension: Secondary | ICD-10-CM | POA: Diagnosis not present

## 2022-01-30 DIAGNOSIS — Z6841 Body Mass Index (BMI) 40.0 and over, adult: Secondary | ICD-10-CM | POA: Diagnosis not present

## 2022-01-30 DIAGNOSIS — J9610 Chronic respiratory failure, unspecified whether with hypoxia or hypercapnia: Secondary | ICD-10-CM | POA: Diagnosis not present

## 2022-01-30 DIAGNOSIS — N185 Chronic kidney disease, stage 5: Secondary | ICD-10-CM | POA: Diagnosis not present

## 2022-01-30 DIAGNOSIS — E669 Obesity, unspecified: Secondary | ICD-10-CM | POA: Diagnosis not present

## 2022-01-30 DIAGNOSIS — E1165 Type 2 diabetes mellitus with hyperglycemia: Secondary | ICD-10-CM | POA: Diagnosis not present

## 2022-02-18 DIAGNOSIS — E662 Morbid (severe) obesity with alveolar hypoventilation: Secondary | ICD-10-CM | POA: Diagnosis not present

## 2022-02-22 DIAGNOSIS — I5032 Chronic diastolic (congestive) heart failure: Secondary | ICD-10-CM | POA: Diagnosis not present

## 2022-02-22 DIAGNOSIS — G4733 Obstructive sleep apnea (adult) (pediatric): Secondary | ICD-10-CM | POA: Diagnosis not present

## 2022-02-22 DIAGNOSIS — I482 Chronic atrial fibrillation, unspecified: Secondary | ICD-10-CM | POA: Diagnosis not present

## 2022-02-22 DIAGNOSIS — J9601 Acute respiratory failure with hypoxia: Secondary | ICD-10-CM | POA: Diagnosis not present

## 2022-02-25 NOTE — Progress Notes (Unsigned)
MRN : [id number]  Nicole Schmidt is a 57 y.o. (August 31, 1965) female who presents with chief complaint of check access.  History of Present Illness:   The patient returns to the office for followup of their dialysis access.    She is s/p left brachial cephalic fistula creation on 12/09/2021.   Currently she is using her catheter for HD.  The patient denies hand pain or other symptoms consistent with steal phenomena.  No significant arm swelling.   The patient denies any complaints from the dialysis center or their nephrologist.   The patient denies redness or swelling at the access site. The patient denies fever or chills at home or while on dialysis.   Duplex ultrasound of the AV access shows a patent access.  The previously noted stenosis is not significantly changed compared to last study.  Flow volume today is 1080 cc/min    No outpatient medications have been marked as taking for the 02/27/22 encounter (Appointment) with Delana Meyer, Dolores Lory, MD.    Past Medical History:  Diagnosis Date   (HFpEF) heart failure with preserved ejection fraction ([place])    a. 10/2018 Echo: EF 60-65%, diast dysfxn, RVSP 50.7 mmHg; b. 01/2020 Echo: EF 65-70%, G2DD; c. 01/2021 Echo: EF 60-65%, no rwma, mod LVH. Nl RV fxn, mildly enlarged RV. Mod BAE. Triv MR. d. 05/2021 Echo: EF 55-60; no RWMAs, mild LVH, mild TR.   Acquired hypothyroidism 02/09/2020   Adenoma of left adrenal gland    Anemia    Aortic atherosclerosis (HCC)    Arthritis    Breast cancer of upper-inner quadrant of right female breast ([place])    Right breast invasive CA and DCIS , 7 mm T1,N0,M0. Er/PR pos, her 2 negative.  Margins 1 mm.   COPD (chronic obstructive pulmonary disease) (HCC)    Diabetes mellitus type 2 in obese ([place]) 02/09/2020   Difficult intubation 09/28/2021   a.) mallampati III-IV; BMI >55 kg/m   Diverticulosis    Dyslipidemia 02/09/2020   ESBL (extended spectrum beta-lactamase) producing bacteria  infection 03/20/2021   a. urine culture (+) for ESBL producing E.coli and P. mirabilis   ESRD (end stage renal disease) on dialysis ([place])    a. T-Th-Sat HD initiated 02/2021   Essential hypertension    Gout    History of kidney stones    Long term current use of anticoagulant    a.) apixaban   Menopause    age 76   Morbid obesity ([place])    OSA treated with BiPAP    a. on nocturnal PAP therapy; Trilogy 100 ventilator   Permanent atrial fibrillation ([place])    a. Dx 10/2018 in setting of PNA. b. CHA2DS2-VASc = 7 (sex, HFpEF, HTN, PE x2, aortic plaque, T2DM); c. s/p successful DCCV 11/2018. d. subsequent recurrent Afib; rate/rhythm maintained on oral metoprolol succinate; chronically anticoagulated using standard dose apixaban.   Personal history of radiation therapy    Pulmonary embolism ([place]) 10/2014   a. on chronic apixaban   Restrictive lung disease    Shingles 10/24/2021   Sleep difficulties    a.) takes melatonin   Thyroid goiter     Past Surgical History:  Procedure Laterality Date   A/V FISTULAGRAM Left 09/02/2021   Procedure: A/V Fistulagram;  Surgeon: Katha Cabal, MD;  Location: [facility];  Service: Cardiovascular;  Laterality: Left;   AV FISTULA PLACEMENT Left 07/22/2021   Procedure: ARTERIOVENOUS (AV) FISTULA CREATION (  [place]);  Surgeon: Katha Cabal, MD;  Location: [facility];  Service: Vascular;  Laterality: Left;   AV FISTULA PLACEMENT Left 12/09/2021   Procedure: ARTERIOVENOUS (AV) FISTULA CREATION (BRACHIAL CEPHALIC);  Surgeon: Katha Cabal, MD;  Location: [facility];  Service: Vascular;  Laterality: Left;   BREAST BIOPSY Right 2014   breast ca   BREAST EXCISIONAL BIOPSY Right 07/19/2012   breast ca   BREAST SURGERY Right 2014   with sentinel node bx subareaolar duct excision   CARDIOVERSION N/A 11/28/2018   Procedure: CARDIOVERSION;  Surgeon: Minna Merritts, MD;  Location: [facility];  Service: Cardiovascular;  Laterality: N/A;    COLONOSCOPY WITH PROPOFOL N/A 01/30/2017   Procedure: COLONOSCOPY WITH PROPOFOL;  Surgeon: Christene Lye, MD;  Location: [facility];  Service: Endoscopy;  Laterality: N/A;   CYSTOSCOPY W/ RETROGRADES  06/17/2021   Procedure: CYSTOSCOPY WITH RETROGRADE PYELOGRAM;  Surgeon: Billey Co, MD;  Location: [facility];  Service: Urology;;   CYSTOSCOPY W/ URETERAL STENT PLACEMENT Left 02/14/2021   Procedure: CYSTOSCOPY WITH RETROGRADE PYELOGRAM/URETERAL STENT PLACEMENT;  Surgeon: Billey Co, MD;  Location: [facility];  Service: Urology;  Laterality: Left;   CYSTOSCOPY/URETEROSCOPY/HOLMIUM LASER/STENT PLACEMENT Left 06/17/2021   Procedure: CYSTOSCOPY/URETEROSCOPY/HOLMIUM LASER/STENT EXCHANGE;  Surgeon: Billey Co, MD;  Location: [facility];  Service: Urology;  Laterality: Left;   DIALYSIS/PERMA CATHETER INSERTION N/A 02/24/2021   Procedure: DIALYSIS/PERMA CATHETER INSERTION;  Surgeon: Algernon Huxley, MD;  Location: [facility];  Service: Cardiovascular;  Laterality: N/A;   DIALYSIS/PERMA CATHETER INSERTION N/A 12/16/2021   Procedure: DIALYSIS/PERMA CATHETER INSERTION;  Surgeon: Katha Cabal, MD;  Location: [facility] [facility];  Service: Cardiovascular;  Laterality: N/A;   PERIPHERAL VASCULAR THROMBECTOMY Left 09/28/2021   Procedure: PERIPHERAL VASCULAR THROMBECTOMY;  Surgeon: Katha Cabal, MD;  Location: [facility];  Service: Cardiovascular;  Laterality: Left;    Social History Social History   Tobacco Use   Smoking status: Never    Passive exposure: Never   Smokeless tobacco: Never  Vaping Use   Vaping Use: Never used  Substance Use Topics   Alcohol use: No    Alcohol/week: 0.0 standard drinks of alcohol   Drug use: No    Family History Family History  Problem Relation Age of Onset   Diabetes Father    Hypertension Father    Parkinson's disease Father    Hypertension Mother    Rheum arthritis Mother    Atrial fibrillation Mother    Heart  failure Mother    Heart attack Mother        died in her mid-70's.   Colon cancer Cousin 54   Breast cancer Neg Hx     Allergies  Allergen Reactions   Spironolactone     Hyperkalemia  Pt unaware of this allergy     REVIEW OF SYSTEMS (Negative unless checked)  Constitutional: '[]'$ Weight loss  '[]'$ Fever  '[]'$ Chills Cardiac: '[]'$ Chest pain   '[]'$ Chest pressure   '[]'$ Palpitations   '[]'$ Shortness of breath when laying flat   '[]'$ Shortness of breath with exertion. Vascular:  '[]'$ Pain in legs with walking   '[]'$ Pain in legs at rest  '[]'$ History of DVT   '[]'$ Phlebitis   '[]'$ Swelling in legs   '[]'$ Varicose veins   '[]'$ Non-healing ulcers Pulmonary:   '[]'$ Uses home oxygen   '[]'$ Productive cough   '[]'$ Hemoptysis   '[]'$ Wheeze  '[]'$ COPD   '[]'$ Asthma Neurologic:  '[]'$ Dizziness   '[]'$ Seizures   '[]'$ History of stroke   '[]'$ History of TIA  '[]'$   Aphasia   '[]'$ Vissual changes   '[]'$ Weakness or numbness in arm   '[]'$ Weakness or numbness in leg Musculoskeletal:   '[]'$ Joint swelling   '[]'$ Joint pain   '[]'$ Low back pain Hematologic:  '[]'$ Easy bruising  '[]'$ Easy bleeding   '[]'$ Hypercoagulable state   '[]'$ Anemic Gastrointestinal:  '[]'$ Diarrhea   '[]'$ Vomiting  '[]'$ Gastroesophageal reflux/heartburn   '[]'$ Difficulty swallowing. Genitourinary:  '[x]'$ Chronic kidney disease   '[]'$ Difficult urination  '[]'$ Frequent urination   '[]'$ Blood in urine Skin:  '[]'$ Rashes   '[]'$ Ulcers  Psychological:  '[]'$ History of anxiety   '[]'$  History of major depression.  Physical Examination  There were no vitals filed for this visit. There is no height or weight on file to calculate BMI. Gen: WD/WN, NAD Head: [place]/AT, No temporalis wasting.  Ear/Nose/Throat: Hearing grossly intact, nares w/o erythema or drainage Eyes: PER, EOMI, sclera nonicteric.  Neck: Supple, no gross masses or lesions.  No JVD.  Pulmonary:  Good air movement, no audible wheezing, no use of accessory muscles.  Cardiac: RRR, precordium non-hyperdynamic. Vascular:   *** Vessel Right Left  Radial Palpable Palpable  Brachial Palpable Palpable   Gastrointestinal: soft, non-distended. No guarding/no peritoneal signs.  Musculoskeletal: M/S 5/5 throughout.  No deformity.  Neurologic: CN 2-12 intact. Pain and light touch intact in extremities.  Symmetrical.  Speech is fluent. Motor exam as listed above. Psychiatric: Judgment intact, Mood & affect appropriate for pt's clinical situation. Dermatologic: No rashes or ulcers noted.  No changes consistent with cellulitis.   CBC Lab Results  Component Value Date   WBC 9.1 12/16/2021   HGB 10.9 (L) 12/16/2021   HCT 34.7 (L) 12/16/2021   MCV 107.8 (H) 12/16/2021   PLT 227 12/16/2021    BMET    Component Value Date/Time   NA 139 12/16/2021 0830   NA 144 02/27/2020 0931   NA 141 03/09/2014 1533   K 5.0 12/16/2021 0830   K 4.2 03/09/2014 1533   CL 98 12/16/2021 0830   CL 101 03/09/2014 1533   CO2 28 12/16/2021 0830   CO2 30 03/09/2014 1533   GLUCOSE 140 (H) 12/16/2021 0830   GLUCOSE 95 03/09/2014 1533   BUN 62 (H) 12/16/2021 0830   BUN 40 (H) 02/27/2020 0931   BUN 35 (H) 03/09/2014 1533   CREATININE 8.99 (H) 12/16/2021 0830   CREATININE 1.50 (H) 03/09/2014 1533   CALCIUM 9.0 12/16/2021 0830   CALCIUM 8.8 03/09/2014 1533   GFRNONAA 5 (L) 12/16/2021 0830   GFRNONAA 39 (L) 03/09/2014 1533   GFRNONAA 55 (L) 07/31/2013 1521   GFRAA 35 (L) 02/27/2020 0931   GFRAA 47 (L) 03/09/2014 1533   GFRAA >60 07/31/2013 1521   CrCl cannot be calculated (Patient's most recent lab result is older than the maximum 21 days allowed.).  COAG Lab Results  Component Value Date   INR 1.1 02/15/2021   INR 1.35 01/12/2018   INR 1.27 11/16/2017    Radiology No results found.   Assessment/Plan There are no diagnoses linked to this encounter.   [name], MD  02/25/2022 2:34 PM

## 2022-02-27 ENCOUNTER — Ambulatory Visit (INDEPENDENT_AMBULATORY_CARE_PROVIDER_SITE_OTHER): Payer: BC Managed Care – PPO

## 2022-02-27 ENCOUNTER — Other Ambulatory Visit: Payer: Self-pay | Admitting: *Deleted

## 2022-02-27 ENCOUNTER — Ambulatory Visit (INDEPENDENT_AMBULATORY_CARE_PROVIDER_SITE_OTHER): Payer: BC Managed Care – PPO | Admitting: Vascular Surgery

## 2022-02-27 ENCOUNTER — Encounter (INDEPENDENT_AMBULATORY_CARE_PROVIDER_SITE_OTHER): Payer: Self-pay | Admitting: Vascular Surgery

## 2022-02-27 VITALS — BP 107/67 | HR 67 | Ht 64.0 in | Wt 349.0 lb

## 2022-02-27 DIAGNOSIS — N186 End stage renal disease: Secondary | ICD-10-CM

## 2022-02-27 DIAGNOSIS — I482 Chronic atrial fibrillation, unspecified: Secondary | ICD-10-CM

## 2022-02-27 DIAGNOSIS — E785 Hyperlipidemia, unspecified: Secondary | ICD-10-CM

## 2022-02-27 DIAGNOSIS — Z992 Dependence on renal dialysis: Secondary | ICD-10-CM

## 2022-02-27 DIAGNOSIS — E1169 Type 2 diabetes mellitus with other specified complication: Secondary | ICD-10-CM

## 2022-02-27 DIAGNOSIS — I2782 Chronic pulmonary embolism: Secondary | ICD-10-CM

## 2022-02-27 DIAGNOSIS — E669 Obesity, unspecified: Secondary | ICD-10-CM

## 2022-02-27 DIAGNOSIS — I10 Essential (primary) hypertension: Secondary | ICD-10-CM

## 2022-02-27 MED ORDER — APIXABAN 5 MG PO TABS: 5.0000 mg | ORAL_TABLET | Freq: Two times a day (BID) | ORAL | 1 refills | Status: DC

## 2022-03-01 ENCOUNTER — Other Ambulatory Visit: Payer: Self-pay | Admitting: *Deleted

## 2022-03-01 DIAGNOSIS — I2782 Chronic pulmonary embolism: Secondary | ICD-10-CM

## 2022-03-01 MED ORDER — APIXABAN 5 MG PO TABS: 5.0000 mg | ORAL_TABLET | Freq: Two times a day (BID) | ORAL | 1 refills | Status: DC

## 2022-03-15 DIAGNOSIS — N186 End stage renal disease: Secondary | ICD-10-CM | POA: Diagnosis not present

## 2022-03-15 DIAGNOSIS — Z992 Dependence on renal dialysis: Secondary | ICD-10-CM | POA: Diagnosis not present

## 2022-03-16 ENCOUNTER — Telehealth (INDEPENDENT_AMBULATORY_CARE_PROVIDER_SITE_OTHER): Payer: Self-pay

## 2022-03-16 NOTE — Telephone Encounter (Signed)
Spoke with the patient to schedule her for a revision of left brachial cephalic fistula with Dr. Delana Meyer. Patient was offered 03/31/22, 04/14/22 and declined stating she had other appts and had a concert to attend as well and wanted to be scheduled for 04/28/22. Pre-surgical instructions were discussed and will be mailed.

## 2022-03-21 DIAGNOSIS — E662 Morbid (severe) obesity with alveolar hypoventilation: Secondary | ICD-10-CM | POA: Diagnosis not present

## 2022-03-25 DIAGNOSIS — I482 Chronic atrial fibrillation, unspecified: Secondary | ICD-10-CM | POA: Diagnosis not present

## 2022-03-25 DIAGNOSIS — I5032 Chronic diastolic (congestive) heart failure: Secondary | ICD-10-CM | POA: Diagnosis not present

## 2022-03-25 DIAGNOSIS — J9601 Acute respiratory failure with hypoxia: Secondary | ICD-10-CM | POA: Diagnosis not present

## 2022-03-25 DIAGNOSIS — G4733 Obstructive sleep apnea (adult) (pediatric): Secondary | ICD-10-CM | POA: Diagnosis not present

## 2022-04-13 DIAGNOSIS — Z992 Dependence on renal dialysis: Secondary | ICD-10-CM | POA: Diagnosis not present

## 2022-04-13 DIAGNOSIS — N186 End stage renal disease: Secondary | ICD-10-CM | POA: Diagnosis not present

## 2022-04-17 ENCOUNTER — Telehealth: Payer: Self-pay | Admitting: *Deleted

## 2022-04-17 ENCOUNTER — Telehealth: Payer: Self-pay

## 2022-04-17 NOTE — Telephone Encounter (Signed)
  Patient Consent for Virtual Visit        Nicole Schmidt has provided verbal consent on 04/17/2022 for a virtual visit (video or telephone).   CONSENT FOR VIRTUAL VISIT FOR:  Nicole Schmidt  By participating in this virtual visit I agree to the following:  I hereby voluntarily request, consent and authorize [facility] and its employed or contracted physicians, physician assistants, nurse practitioners or other licensed health care professionals (the Practitioner), to provide me with telemedicine health care services (the "Services") as deemed necessary by the treating Practitioner. I acknowledge and consent to receive the Services by the Practitioner via telemedicine. I understand that the telemedicine visit will involve communicating with the Practitioner through live audiovisual communication technology and the disclosure of certain medical information by electronic transmission. I acknowledge that I have been given the opportunity to request an in-person assessment or other available alternative prior to the telemedicine visit and am voluntarily participating in the telemedicine visit.  I understand that I have the right to withhold or withdraw my consent to the use of telemedicine in the course of my care at any time, without affecting my right to future care or treatment, and that the Practitioner or I may terminate the telemedicine visit at any time. I understand that I have the right to inspect all information obtained and/or recorded in the course of the telemedicine visit and may receive copies of available information for a reasonable fee.  I understand that some of the potential risks of receiving the Services via telemedicine include:  Delay or interruption in medical evaluation due to technological equipment failure or disruption; Information transmitted may not be sufficient (e.g. poor resolution of images) to allow for appropriate medical decision making by the Practitioner;  and/or  In rare instances, security protocols could fail, causing a breach of personal health information.  Furthermore, I acknowledge that it is my responsibility to provide information about my medical history, conditions and care that is complete and accurate to the best of my ability. I acknowledge that Practitioner's advice, recommendations, and/or decision may be based on factors not within their control, such as incomplete or inaccurate data provided by me or distortions of diagnostic images or specimens that may result from electronic transmissions. I understand that the practice of medicine is not an exact science and that Practitioner makes no warranties or guarantees regarding treatment outcomes. I acknowledge that a copy of this consent can be made available to me via my patient portal ([facility]), or I can request a printed copy by calling the office of [facility].    I understand that my insurance will be billed for this visit.   I have read or had this consent read to me. I understand the contents of this consent, which adequately explains the benefits and risks of the Services being provided via telemedicine.  I have been provided ample opportunity to ask questions regarding this consent and the Services and have had my questions answered to my satisfaction. I give my informed consent for the services to be provided through the use of telemedicine in my medical care

## 2022-04-17 NOTE — Telephone Encounter (Signed)
Spoke with patient who is agreeable to do a tele visit on 3/11 at 2:20 p. Med rec and consent has been done.

## 2022-04-17 NOTE — Telephone Encounter (Signed)
   Name: Nicole Schmidt  DOB: 1965-12-06  MRN: [id number]  Primary Cardiologist: Kathlyn [place], MD   Preoperative team, please contact this patient and set up a phone call appointment for further preoperative risk assessment. Please obtain consent and complete medication review. Thank you for your help.  I confirm that guidance regarding antiplatelet and oral anticoagulation therapy has been completed and, if necessary, noted below.  Patient with diagnosis of afib on Eliquis for anticoagulation.     Procedure: revision of AV fistula Date of procedure: 04/28/22   CHA2DS2-VASc Score = 5  This indicates a 7.2% annual risk of stroke. The patient's score is based upon: CHF History: 1 HTN History: 1 Diabetes History: 1 Stroke History: 0 Vascular Disease History: 1 Age Score: 0 Gender Score: 1   PE noted 10/2014   CrCl 18m/min using adjusted body weight Platelet count 227K   Per office protocol, patient can hold Eliquis for 2 days prior to procedure.    JDeberah Pelton NP 04/17/2022, 2:09 PM [facility]

## 2022-04-17 NOTE — Telephone Encounter (Signed)
-----   Message from Karen Kitchens, NP sent at 04/15/2022  9:36 AM EST ----- Regarding: Request for pre-operative cardiac clearance Request for pre-operative cardiac clearance:  1. What type of surgery is being performed?  REVISON OF ARTERIOVENOUS FISTULA (BRACHIAL CEPHALIC)  2. When is this surgery scheduled?  04/28/2022  3. Type of clearance being requested (medical, pharmacy, both)? BOTH   4. Are there any medications that need to be held prior to surgery? APIXABAN  5. Practice name and name of physician performing surgery?  Performing surgeon: Dr. [name], MD Requesting clearance: Honor Loh, FNP-C    6. Anesthesia type (none, local, MAC, general)? GENERAL  7. What is the office phone and fax number?   Phone: [phone] Fax: [phone]  ATTENTION: Unable to create telephone message as per your standard workflow. Directed by [facility] providers to send requests for cardiac clearance to this pool for appropriate distribution to provider covering pre-operative clearances.   Honor Loh, MSN, APRN, FNP-C, CEN [facility]  Peri-operative Services Nurse Practitioner Phone: [phone] 04/15/22 9:36 AM

## 2022-04-17 NOTE — Telephone Encounter (Signed)
Patient with diagnosis of afib on Eliquis for anticoagulation.    Procedure: revision of AV fistula Date of procedure: 04/28/22  CHA2DS2-VASc Score = 5  This indicates a 7.2% annual risk of stroke. The patient's score is based upon: CHF History: 1 HTN History: 1 Diabetes History: 1 Stroke History: 0 Vascular Disease History: 1 Age Score: 0 Gender Score: 1   PE noted 10/2014  CrCl 39m/min using adjusted body weight Platelet count 227K  Per office protocol, patient can hold Eliquis for 2 days prior to procedure.    **This guidance is not considered finalized until pre-operative APP has relayed final recommendations.**

## 2022-04-18 ENCOUNTER — Telehealth: Payer: Self-pay | Admitting: *Deleted

## 2022-04-18 NOTE — Telephone Encounter (Signed)
-----   Message from Karen Kitchens, NP sent at 04/18/2022  5:21 PM EST ----- Regarding: Request for pre-operative cardiac clearance Request for pre-operative cardiac clearance:  1. What type of surgery is being performed?  REVISON OF ARTERIOVENOUS FISTULA (BRACHIAL CEPHALIC)  2. When is this surgery scheduled?  04/28/2022  3. Type of clearance being requested (medical, pharmacy, both)? BOTH   4. Are there any medications that need to be held prior to surgery? APIXABAN  5. Practice name and name of physician performing surgery?  Performing surgeon: Dr. [name]. MD Requesting clearance: Honor Loh, FNP-C    6. Anesthesia type (none, local, MAC, general)? GENERAL  7. What is the office phone and fax number?   Phone: [phone] Fax: [phone]  ATTENTION: Unable to create telephone message as per your standard workflow. Directed by [facility] providers to send requests for cardiac clearance to this pool for appropriate distribution to provider covering pre-operative clearances.   Honor Loh, MSN, APRN, FNP-C, CEN [facility]  Peri-operative Services Nurse Practitioner Phone: [phone] 04/18/22 5:21 PM

## 2022-04-19 ENCOUNTER — Encounter
Admission: RE | Admit: 2022-04-19 | Discharge: 2022-04-19 | Disposition: A | Discharge status: Home / Self Care | Payer: BC Managed Care – PPO | Source: Ambulatory Visit | Attending: Vascular Surgery | Admitting: Vascular Surgery

## 2022-04-19 ENCOUNTER — Other Ambulatory Visit (INDEPENDENT_AMBULATORY_CARE_PROVIDER_SITE_OTHER): Payer: Self-pay | Admitting: Nurse Practitioner

## 2022-04-19 ENCOUNTER — Other Ambulatory Visit: Payer: Self-pay

## 2022-04-19 VITALS — Ht 64.0 in | Wt 349.0 lb

## 2022-04-19 DIAGNOSIS — E662 Morbid (severe) obesity with alveolar hypoventilation: Secondary | ICD-10-CM | POA: Diagnosis not present

## 2022-04-19 DIAGNOSIS — E1165 Type 2 diabetes mellitus with hyperglycemia: Secondary | ICD-10-CM

## 2022-04-19 DIAGNOSIS — N186 End stage renal disease: Secondary | ICD-10-CM

## 2022-04-19 DIAGNOSIS — N183 Chronic kidney disease, stage 3 unspecified: Secondary | ICD-10-CM

## 2022-04-19 NOTE — Patient Instructions (Addendum)
Your procedure is scheduled on:04/28/2022  Report to the Registration Desk on the 1st floor of the [facility]. To find out your arrival time, please call [phone] between 1PM - 3PM on: 04/27/2022  If your arrival time is 6:00 am, do not arrive before that time as the [facility] entrance doors do not open until 6:00 am.  REMEMBER: Instructions that are not followed completely may result in serious medical risk, up to and including death; or upon the discretion of your surgeon and anesthesiologist your surgery may need to be rescheduled.  Do not eat food after midnight the night before surgery.  No gum chewing or hard candies.  You may however, drink CLEAR liquids up to 2 hours before you are scheduled to arrive for your surgery. Do not drink anything within 2 hours of your scheduled arrival time.  Clear liquids include: - water   One week prior to surgery: Stop Anti-inflammatories (NSAIDS) such as Advil, Aleve, Ibuprofen, Motrin, Naproxen, Naprosyn and Aspirin based products such as Excedrin, Goody's Powder, BC Powder. Stop ANY OVER THE COUNTER supplements until after surgery like CRANBERRY , folic acid (FOLVITE) ,     melatonin , You may however, continue to take Tylenol if needed for pain up until the day of surgery.  Continue taking all prescribed medications with the exception of the following: Eliquis . You are instructed to call Dr. Clayborn Bigness regarding Eliquis. Follow recommendations from Cardiologist or PCP regarding stopping blood thinners. Your Pre-op RN will ask you the last dose.  **Follow guidelines for diabetes medications** Trulicity should be out of your system 7 days prior to surgery. Do not take it 7 days prior to surgery.   TAKE ONLY THESE MEDICATIONS THE MORNING OF SURGERY WITH A SIP OF WATER:  atorvastatin (LIPITOR)  cloNIDine (CATAPRES)  levothyroxine (SYNTHROID)   Use inhalers on the day of surgery and bring to the hospital.  No Alcohol for 24 hours  before or after surgery.  No Smoking including e-cigarettes for 24 hours before surgery.  No chewable tobacco products for at least 6 hours before surgery.  No nicotine patches on the day of surgery.  Do not use any "recreational" drugs for at least a week (preferably 2 weeks) before your surgery.  Please be advised that the combination of cocaine and anesthesia may have negative outcomes, up to and including death. If you test positive for cocaine, your surgery will be cancelled.  On the morning of surgery brush your teeth with toothpaste and water, you may rinse your mouth with mouthwash if you wish. Do not swallow any toothpaste or mouthwash.  Use CHG  wipes as directed on instruction sheet.  Do not wear jewelry, make-up, hairpins, clips or nail polish.  Do not wear lotions, powders, or perfumes.   Do not shave body hair from the neck down 48 hours before surgery.  Contact lenses, hearing aids and dentures may not be worn into surgery.  Do not bring valuables to the hospital. [facility] is not responsible for any missing/lost belongings or valuables.   Bring your C-PAP to the hospital in case you may have to spend the night.   Notify your doctor if there is any change in your medical condition (cold, fever, infection).  Wear comfortable clothing (specific to your surgery type) to the hospital.  After surgery, you can help prevent lung complications by doing breathing exercises.  Take deep breaths and cough every 1-2 hours. Your doctor may order a device called an  Incentive Spirometer to help you take deep breaths.  If you are being admitted to the hospital overnight, leave your suitcase in the car. After surgery it may be brought to your room.  If you are being discharged the day of surgery, you will not be allowed to drive home. You will need a responsible individual to drive you home and stay with you for 24 hours after surgery.   If you are taking public transportation,  you will need to have a responsible individual with you.  Please call the [facility]. at [phone] if you have any questions about these instructions.  Surgery Visitation Policy:  Patients undergoing a surgery or procedure may have two family members or support persons with them as long as the person is not COVID-19 positive or experiencing its symptoms.   Inpatient Visitation:    Visiting hours are 7 a.m. to 8 p.m. Up to four visitors are allowed at one time in a patient room. The visitors may rotate out with other people during the day. One designated support person (adult) may remain overnight.  Due to an increase in RSV and influenza rates and associated hospitalizations, children ages 71 and under will not be able to visit patients in [facility]. Masks continue to be strongly recommended.   Preparing the Skin Before Surgery     To help prevent the risk of infection at your surgical site, we are now providing you with rinse-free Sage 2% Chlorhexidine Gluconate (CHG) disposable wipes.  Chlorhexidine Gluconate (CHG) Soap  o An antiseptic cleaner that kills germs and bonds with the skin to continue killing germs even after washing  o Used for showering the night before surgery and morning of surgery  The night before surgery: Shower or bathe with warm water. Do not apply perfume, lotions, powders. Wait one hour after shower. Skin should be dry and cool. Open Sage wipe package - use 6 disposable cloths. Wipe body using one cloth for the right arm, one cloth for the left arm, one cloth for the right leg, one cloth for the left leg, one cloth for the chest/abdomen area, and one cloth for the back. Do not use on open wounds or sores. Do not use on face or genitals (private parts). If you are breast feeding, do not use on breasts. 5. Do not rinse, allow to dry. 6. Skin may feel "tacky" for several minutes. 7. Dress in clean clothes. 8. Place  clean sheets on your bed and do not sleep with pets.  REPEAT ABOVE ON THE MORNING OF SURGERY BEFORE ARRIVING TO [facility].

## 2022-04-19 NOTE — Patient Instructions (Signed)
Your procedure is scheduled on: Report to the Registration Desk on the 1st floor of the [facility]. To find out your arrival time, please call [phone] between 1PM - 3PM on: If your arrival time is 6:00 am, do not arrive before that time as the [facility] entrance doors do not open until 6:00 am.  REMEMBER: Instructions that are not followed completely may result in serious medical risk, up to and including death; or upon the discretion of your surgeon and anesthesiologist your surgery may need to be rescheduled.  Do not eat food after midnight the night before surgery.  No gum chewing or hard candies.  You may however, drink CLEAR liquids up to 2 hours before you are scheduled to arrive for your surgery. Do not drink anything within 2 hours of your scheduled arrival time.  Clear liquids include: - water  - apple juice without pulp - gatorade (not RED colors) - black coffee or tea (Do NOT add milk or creamers to the coffee or tea) Do NOT drink anything that is not on this list.  Type 1 and Type 2 diabetics should only drink water.  In addition, your doctor has ordered for you to drink the provided:  Ensure Pre-Surgery Clear Carbohydrate Drink  Gatorade G2 Drinking this carbohydrate drink up to two hours before surgery helps to reduce insulin resistance and improve patient outcomes. Please complete drinking 2 hours before scheduled arrival time.  One week prior to surgery: Stop Anti-inflammatories (NSAIDS) such as Advil, Aleve, Ibuprofen, Motrin, Naproxen, Naprosyn and Aspirin based products such as Excedrin, Goody's Powder, BC Powder. Stop ANY OVER THE COUNTER supplements until after surgery. You may however, continue to take Tylenol if needed for pain up until the day of surgery.  Continue taking all prescribed medications with the exception of the following:  **Follow guidelines for insulin and diabetes medications**  Follow recommendations from Cardiologist or PCP  regarding stopping blood thinners.  TAKE ONLY THESE MEDICATIONS THE MORNING OF SURGERY WITH A SIP OF WATER:    Antacid (take one the night before and one on the morning of surgery - helps to prevent nausea after surgery.)  Use inhalers on the day of surgery and bring to the hospital.  Fleets enema or bowel prep as directed.  No Alcohol for 24 hours before or after surgery.  No Smoking including e-cigarettes for 24 hours before surgery.  No chewable tobacco products for at least 6 hours before surgery.  No nicotine patches on the day of surgery.  Do not use any "recreational" drugs for at least a week (preferably 2 weeks) before your surgery.  Please be advised that the combination of cocaine and anesthesia may have negative outcomes, up to and including death. If you test positive for cocaine, your surgery will be cancelled.  On the morning of surgery brush your teeth with toothpaste and water, you may rinse your mouth with mouthwash if you wish. Do not swallow any toothpaste or mouthwash.  Use CHG Soap or wipes as directed on instruction sheet.  Do not wear jewelry, make-up, hairpins, clips or nail polish.  Do not wear lotions, powders, or perfumes.   Do not shave body hair from the neck down 48 hours before surgery.  Contact lenses, hearing aids and dentures may not be worn into surgery.  Do not bring valuables to the hospital. [facility] is not responsible for any missing/lost belongings or valuables.   Total Shoulder Arthroplasty:  use Benzoyl Peroxide 5% Gel as  directed on instruction sheet.  Bring your C-PAP to the hospital in case you may have to spend the night.   Notify your doctor if there is any change in your medical condition (cold, fever, infection).  Wear comfortable clothing (specific to your surgery type) to the hospital.  After surgery, you can help prevent lung complications by doing breathing exercises.  Take deep breaths and cough every 1-2 hours.  Your doctor may order a device called an Incentive Spirometer to help you take deep breaths. When coughing or sneezing, hold a pillow firmly against your incision with both hands. This is called "splinting." Doing this helps protect your incision. It also decreases belly discomfort.  If you are being admitted to the hospital overnight, leave your suitcase in the car. After surgery it may be brought to your room.  In case of increased patient census, it may be necessary for you, the patient, to continue your postoperative care in the Same Day Surgery department.  If you are being discharged the day of surgery, you will not be allowed to drive home. You will need a responsible individual to drive you home and stay with you for 24 hours after surgery.   If you are taking public transportation, you will need to have a responsible individual with you.  Please call the [facility] Dept. at [phone] if you have any questions about these instructions.  Surgery Visitation Policy:  Patients undergoing a surgery or procedure may have two family members or support persons with them as long as the person is not COVID-19 positive or experiencing its symptoms.   Inpatient Visitation:    Visiting hours are 7 a.m. to 8 p.m. Up to four visitors are allowed at one time in a patient room. The visitors may rotate out with other people during the day. One designated support person (adult) may remain overnight.  Due to an increase in RSV and influenza rates and associated hospitalizations, children ages 93 and under will not be able to visit patients in [facility]. Masks continue to be strongly recommended.      Preparing for Surgery with CHLORHEXIDINE GLUCONATE (CHG) Soap  Chlorhexidine Gluconate (CHG) Soap  o An antiseptic cleaner that kills germs and bonds with the skin to continue killing germs even after washing  o Used for showering the night before surgery and morning  of surgery  Before surgery, you can play an important role by reducing the number of germs on your skin.  CHG (Chlorhexidine gluconate) soap is an antiseptic cleanser which kills germs and bonds with the skin to continue killing germs even after washing.  Please do not use if you have an allergy to CHG or antibacterial soaps. If your skin becomes reddened/irritated stop using the CHG.  1. Shower the NIGHT BEFORE SURGERY and the MORNING OF SURGERY with CHG soap.  2. If you choose to wash your hair, wash your hair first as usual with your normal shampoo.  3. After shampooing, rinse your hair and body thoroughly to remove the shampoo.  4. Use CHG as you would any other liquid soap. You can apply CHG directly to the skin and wash gently with a scrungie or a clean washcloth.  5. Apply the CHG soap to your body only from the neck down. Do not use on open wounds or open sores. Avoid contact with your eyes, ears, mouth, and genitals (private parts). Wash face and genitals (private parts) with your normal soap.  6. Wash thoroughly,  paying special attention to the area where your surgery will be performed.  7. Thoroughly rinse your body with warm water.  8. Do not shower/wash with your normal soap after using and rinsing off the CHG soap.  9. Pat yourself dry with a clean towel.  10. Wear clean pajamas to bed the night before surgery.  12. Place clean sheets on your bed the night of your first shower and do not sleep with pets.  13. Shower again with the CHG soap on the day of surgery prior to arriving at the hospital.  14. Do not apply any deodorants/lotions/powders.  15. Please wear clean clothes to the hospital.

## 2022-04-19 NOTE — Telephone Encounter (Signed)
Duplicate request.  Patient already has phone visit set up for 04/24/2022.  Will remove from preoperative pool.  Jossie Ng. Kelda Azad NP-C     04/19/2022, 10:15 AM [place] [address] Office [phone] Fax [phone]

## 2022-04-21 ENCOUNTER — Encounter: Payer: Self-pay | Admitting: Urgent Care

## 2022-04-21 ENCOUNTER — Encounter
Admission: RE | Admit: 2022-04-21 | Discharge: 2022-04-21 | Disposition: A | Discharge status: Home / Self Care | Payer: BC Managed Care – PPO | Source: Ambulatory Visit | Attending: Vascular Surgery | Admitting: Vascular Surgery

## 2022-04-21 DIAGNOSIS — N186 End stage renal disease: Secondary | ICD-10-CM | POA: Diagnosis not present

## 2022-04-21 DIAGNOSIS — Z01812 Encounter for preprocedural laboratory examination: Secondary | ICD-10-CM | POA: Insufficient documentation

## 2022-04-21 LAB — CBC WITH DIFFERENTIAL/PLATELET
Abs Immature Granulocytes: 0.04 10*3/uL (ref 0.00–0.07)
Basophils Absolute: 0 10*3/uL (ref 0.0–0.1)
Basophils Relative: 0 %
Eosinophils Absolute: 0.2 10*3/uL (ref 0.0–0.5)
Eosinophils Relative: 3 %
HCT: 40 % (ref 36.0–46.0)
Hemoglobin: 12.2 g/dL (ref 12.0–15.0)
Immature Granulocytes: 1 %
Lymphocytes Relative: 22 %
Lymphs Abs: 1.8 10*3/uL (ref 0.7–4.0)
MCH: 33.7 pg (ref 26.0–34.0)
MCHC: 30.5 g/dL (ref 30.0–36.0)
MCV: 110.5 fL — ABNORMAL HIGH (ref 80.0–100.0)
Monocytes Absolute: 0.8 10*3/uL (ref 0.1–1.0)
Monocytes Relative: 9 %
Neutro Abs: 5.5 10*3/uL (ref 1.7–7.7)
Neutrophils Relative %: 65 %
Platelets: 196 10*3/uL (ref 150–400)
RBC: 3.62 MIL/uL — ABNORMAL LOW (ref 3.87–5.11)
RDW: 15 % (ref 11.5–15.5)
WBC: 8.4 10*3/uL (ref 4.0–10.5)
nRBC: 0.2 % (ref 0.0–0.2)

## 2022-04-21 LAB — BASIC METABOLIC PANEL
Anion gap: 12 (ref 5–15)
BUN: 38 mg/dL — ABNORMAL HIGH (ref 6–20)
CO2: 29 mmol/L (ref 22–32)
Calcium: 8.4 mg/dL — ABNORMAL LOW (ref 8.9–10.3)
Chloride: 97 mmol/L — ABNORMAL LOW (ref 98–111)
Creatinine, Ser: 7.21 mg/dL — ABNORMAL HIGH (ref 0.44–1.00)
GFR, Estimated: 6 mL/min — ABNORMAL LOW (ref >60)
Glucose, Bld: 149 mg/dL — ABNORMAL HIGH (ref 70–99)
Potassium: 4 mmol/L (ref 3.5–5.1)
Sodium: 138 mmol/L (ref 135–145)

## 2022-04-21 LAB — TYPE AND SCREEN
ABO/RH(D): O NEG
Antibody Screen: NEGATIVE

## 2022-04-23 DIAGNOSIS — I5032 Chronic diastolic (congestive) heart failure: Secondary | ICD-10-CM | POA: Diagnosis not present

## 2022-04-23 DIAGNOSIS — G4733 Obstructive sleep apnea (adult) (pediatric): Secondary | ICD-10-CM | POA: Diagnosis not present

## 2022-04-23 DIAGNOSIS — J9601 Acute respiratory failure with hypoxia: Secondary | ICD-10-CM | POA: Diagnosis not present

## 2022-04-23 DIAGNOSIS — I482 Chronic atrial fibrillation, unspecified: Secondary | ICD-10-CM | POA: Diagnosis not present

## 2022-04-24 ENCOUNTER — Ambulatory Visit: Payer: BC Managed Care – PPO | Attending: Cardiovascular Disease

## 2022-04-24 DIAGNOSIS — Z01810 Encounter for preprocedural cardiovascular examination: Secondary | ICD-10-CM | POA: Diagnosis not present

## 2022-04-24 NOTE — Progress Notes (Signed)
Virtual Visit via Telephone Note   Because of Nicole Schmidt's co-morbid illnesses, she is at least at moderate risk for complications without adequate follow up.  This format is felt to be most appropriate for this patient at this time.  The patient did not have access to video technology/had technical difficulties with video requiring transitioning to audio format only (telephone).  All issues noted in this document were discussed and addressed.  No physical exam could be performed with this format.  Please refer to the patient's chart for her consent to telehealth for [facility].  Evaluation Performed:  Preoperative cardiovascular risk assessment _____________   Date:  04/24/2022   Patient ID:  LAPORCHA CAPPELLETTI, DOB 05-14-1965, MRN [id number] Patient Location:  Home Provider location:   Office  Primary Care Provider:  Lenard Simmer, MD Primary Cardiologist:  Kathlyn Sacramento, MD  Chief Complaint / Patient Profile   57 y.o. y/o female with a h/o HFpEF, PAF, HTN, HLD, DM type II, ESRD, anemia, obesity, hypothyroidism, pulmonary embolism, breast CA s/p radiation therapy who is pending AV fistula revision and presents today for telephonic preoperative cardiovascular risk assessment.  History of Present Illness    Nicole Schmidt is a 57 y.o. female who presents via audio/video conferencing for a telehealth visit today.  Pt was last seen in cardiology clinic on 01/13/2022 by  Ignacia Bayley, NP .  At that time Nicole Schmidt was doing well and having dialysis Tuesday, Thursday, Saturday and sometimes experiences hypotension requiring midodrine. She did have some mild lower extremity swelling due to Thanksgiving holiday previously. The patient is now pending procedure as outlined above. Since her last visit, she has been doing well with no new cardiac complaints.  She is taking her blood pressure medications differently per her PCP to avoid hypotension during dialysis.  She is no longer  taking amlodipine and now takes clonidine and metoprolol in the evenings following her dialysis session.  She denies chest pain, shortness of breath, lower extremity edema, fatigue, palpitations, melena, hematuria, hemoptysis, diaphoresis, weakness, presyncope, syncope, orthopnea, and PND.     Past Medical History    Past Medical History:  Diagnosis Date   (HFpEF) heart failure with preserved ejection fraction ([place])    a. 10/2018 Echo: EF 60-65%, diast dysfxn, RVSP 50.7 mmHg; b. 01/2020 Echo: EF 65-70%, G2DD; c. 01/2021 Echo: EF 60-65%, no rwma, mod LVH. Nl RV fxn, mildly enlarged RV. Mod BAE. Triv MR. d. 05/2021 Echo: EF 55-60; no RWMAs, mild LVH, mild TR.   Acquired hypothyroidism 02/09/2020   Adenoma of left adrenal gland    Anemia    Aortic atherosclerosis (HCC)    Arthritis    Breast cancer of upper-inner quadrant of right female breast ([place])    Right breast invasive CA and DCIS , 7 mm T1,N0,M0. Er/PR pos, her 2 negative.  Margins 1 mm.   COPD (chronic obstructive pulmonary disease) (HCC)    Diabetes mellitus type 2 in obese ([place]) 02/09/2020   Difficult intubation 09/28/2021   a.) mallampati III-IV; BMI >55 kg/m   Diverticulosis    Dyslipidemia 02/09/2020   ESBL (extended spectrum beta-lactamase) producing bacteria infection 03/20/2021   a. urine culture (+) for ESBL producing E.coli and P. mirabilis   ESRD (end stage renal disease) on dialysis ([place])    a. T-Th-Sat HD initiated 02/2021   Essential hypertension    Gout    History of kidney stones    Long term current use of  anticoagulant    a.) apixaban   Menopause    age 28   Morbid obesity ([place])    OSA treated with BiPAP    a. on nocturnal PAP therapy; Trilogy 100 ventilator   Permanent atrial fibrillation ([place])    a. Dx 10/2018 in setting of PNA. b. CHA2DS2-VASc = 7 (sex, HFpEF, HTN, PE x2, aortic plaque, T2DM); c. s/p successful DCCV 11/2018. d. subsequent recurrent Afib; rate/rhythm maintained on oral metoprolol  succinate; chronically anticoagulated using standard dose apixaban.   Personal history of radiation therapy    Pulmonary embolism ([place]) 10/2014   a. on chronic apixaban   Restrictive lung disease    Shingles 10/24/2021   Sleep difficulties    a.) takes melatonin   Thyroid goiter    Past Surgical History:  Procedure Laterality Date   A/V FISTULAGRAM Left 59/21/2023   Procedure: A/V Fistulagram;  Surgeon: Katha Cabal, MD;  Location: [facility];  Service: Cardiovascular;  Laterality: Left;   AV FISTULA PLACEMENT Left 07/22/2021   Procedure: ARTERIOVENOUS (AV) FISTULA CREATION (RADIALCEPHALIC);  Surgeon: Katha Cabal, MD;  Location: [facility];  Service: Vascular;  Laterality: Left;   AV FISTULA PLACEMENT Left 12/09/2021   Procedure: ARTERIOVENOUS (AV) FISTULA CREATION (BRACHIAL CEPHALIC);  Surgeon: Katha Cabal, MD;  Location: [facility];  Service: Vascular;  Laterality: Left;   BREAST BIOPSY Right 2014   breast ca   BREAST EXCISIONAL BIOPSY Right 07/19/2012   breast ca   BREAST SURGERY Right 2014   with sentinel node bx subareaolar duct excision   CARDIOVERSION N/A 11/28/2018   Procedure: CARDIOVERSION;  Surgeon: Minna Merritts, MD;  Location: [facility];  Service: Cardiovascular;  Laterality: N/A;   COLONOSCOPY WITH PROPOFOL N/A 01/30/2017   Procedure: COLONOSCOPY WITH PROPOFOL;  Surgeon: Christene Lye, MD;  Location: [facility];  Service: Endoscopy;  Laterality: N/A;   CYSTOSCOPY W/ RETROGRADES  06/17/2021   Procedure: CYSTOSCOPY WITH RETROGRADE PYELOGRAM;  Surgeon: Billey Co, MD;  Location: [facility];  Service: Urology;;   CYSTOSCOPY W/ URETERAL STENT PLACEMENT Left 02/14/2021   Procedure: CYSTOSCOPY WITH RETROGRADE PYELOGRAM/URETERAL STENT PLACEMENT;  Surgeon: Billey Co, MD;  Location: [facility];  Service: Urology;  Laterality: Left;   CYSTOSCOPY/URETEROSCOPY/HOLMIUM LASER/STENT PLACEMENT Left 06/17/2021   Procedure:  CYSTOSCOPY/URETEROSCOPY/HOLMIUM LASER/STENT EXCHANGE;  Surgeon: Billey Co, MD;  Location: [facility];  Service: Urology;  Laterality: Left;   DIALYSIS/PERMA CATHETER INSERTION N/A 02/24/2021   Procedure: DIALYSIS/PERMA CATHETER INSERTION;  Surgeon: Algernon Huxley, MD;  Location: [facility];  Service: Cardiovascular;  Laterality: N/A;   DIALYSIS/PERMA CATHETER INSERTION N/A 12/16/2021   Procedure: DIALYSIS/PERMA CATHETER INSERTION;  Surgeon: Katha Cabal, MD;  Location: [facility];  Service: Cardiovascular;  Laterality: N/A;   PERIPHERAL VASCULAR THROMBECTOMY Left 09/28/2021   Procedure: PERIPHERAL VASCULAR THROMBECTOMY;  Surgeon: Katha Cabal, MD;  Location: [facility];  Service: Cardiovascular;  Laterality: Left;    Allergies  Allergies  Allergen Reactions   Spironolactone     Hyperkalemia  Pt unaware of this allergy    Home Medications    Prior to Admission medications   Medication Sig Start Date End Date Taking? Authorizing Provider  acetaminophen (TYLENOL) 325 MG tablet Take 2 tablets (650 mg total) by mouth every 6 (six) hours as needed for mild pain (or Fever >/= 101). 03/15/21   Angiulli, Lavon Paganini, PA-C  albuterol (VENTOLIN HFA) 108 (90 Base) MCG/ACT inhaler Inhale 2 puffs into  the lungs every 6 (six) hours as needed for wheezing or shortness of breath. 04/12/21   Angiulli, Lavon Paganini, PA-C  apixaban (ELIQUIS) 5 MG TABS tablet Take 1 tablet (5 mg total) by mouth 2 (two) times daily. 03/01/22   Verlon Au, NP  atorvastatin (LIPITOR) 40 MG tablet Take 40 mg by mouth daily.    [provider]  cloNIDine (CATAPRES) 0.2 MG tablet Take 0.2 mg by mouth 2 (two) times daily. 05/17/21   [provider]  CRANBERRY PO Take 1 capsule by mouth daily.    [provider]  ergocalciferol (VITAMIN D2) 1.25 MG (50000 UT) capsule Take 1 capsule (50,000 Units total) by mouth once a week. 04/12/21   Angiulli, Lavon Paganini, PA-C  ezetimibe  (ZETIA) 10 MG tablet Take 10 mg by mouth daily with supper.    [provider]  ferric gluconate 250 mg in sodium chloride 0.9 % 250 mL Inject 250 mg into the vein Every Tuesday,Thursday,and Saturday with dialysis. 04/12/21   Angiulli, Lavon Paganini, PA-C  folic acid (FOLVITE) 1 MG tablet Take 1 tablet (1 mg total) by mouth daily. 04/12/21   Angiulli, Lavon Paganini, PA-C  gabapentin (NEURONTIN) 300 MG capsule Take 1 capsule (300 mg total) by mouth at bedtime. 04/12/21   Angiulli, Lavon Paganini, PA-C  Lancets (ONETOUCH DELICA PLUS Q000111Q) [place]  05/17/21   [provider]  levothyroxine (SYNTHROID) 112 MCG tablet Take 1 tablet (112 mcg total) by mouth daily before breakfast. 04/12/21   Angiulli, Lavon Paganini, PA-C  melatonin 5 MG TABS Take 2 tablets (10 mg total) by mouth at bedtime as needed (sleep). 04/12/21   Angiulli, Lavon Paganini, PA-C  metoprolol succinate (TOPROL-XL) 25 MG 24 hr tablet Take 1 tablet (25 mg total) by mouth daily. Take with or immediately following a meal. Patient taking differently: Take 25 mg by mouth at bedtime. Take with or immediately following a meal. 04/29/21   Theora Gianotti, NP  midodrine (PROAMATINE) 10 MG tablet Take 1 tablet (10 mg total) by mouth Every Tuesday,Thursday,and Saturday with dialysis. 04/12/21   Angiulli, Lavon Paganini, PA-C  ONETOUCH ULTRA test strip  05/17/21   [provider]  polyethylene glycol powder (GLYCOLAX/MIRALAX) 17 GM/SCOOP powder Take 1 Container by mouth once.    [provider]  sevelamer carbonate (RENVELA) 800 MG tablet Take 1 tablet (800 mg total) by mouth 3 (three) times daily with meals. 04/12/21   Angiulli, Lavon Paganini, PA-C  traMADol (ULTRAM) 50 MG tablet Take 1 tablet (50 mg total) by mouth every 12 (twelve) hours as needed for moderate pain. 04/12/21   Angiulli, Lavon Paganini, PA-C  TRULICITY 1.5 0000000 SOPN Inject 1.5 mg into the skin every Monday. 05/17/21   [provider]  VELPHORO 500 MG chewable tablet Chew 500 mg by  mouth 3 (three) times daily. 02/07/22   [provider]    Physical Exam    Vital Signs:  ANGELIAH TABOR does not have vital signs available for review today.  Given telephonic nature of communication, physical exam is limited. AAOx3. NAD. Normal affect.  Speech and respirations are unlabored.  Accessory Clinical Findings    None  Assessment & Plan    1.  Preoperative Cardiovascular Risk Assessment:   Ms. Zens perioperative risk of a major cardiac event is 6.6% according to the Revised Cardiac Risk Index (RCRI).  Therefore, she is at high risk for perioperative complications.   Her functional capacity is fair at 4.06 METs according  to the [place] Activity Status Index (DASI). Recommendations: According to ACC/AHA guidelines, no further cardiovascular testing needed.  The patient may proceed to surgery at acceptable risk.   Antiplatelet and/or Anticoagulation Recommendations:  Eliquis (Apixaban) can be held for 2 days prior to surgery.  Please resume post op when felt to be safe.      The patient was advised that if she develops new symptoms prior to surgery to contact our office to arrange for a follow-up visit, and she verbalized understanding.    A copy of this note will be routed to requesting surgeon.  Time:   Today, I have spent 9 minutes with the patient with telehealth technology discussing medical history, symptoms, and management plan.     Mable Fill, Marissa Nestle, NP  04/24/2022, 7:57 AM

## 2022-04-24 NOTE — Progress Notes (Signed)
Perioperative / Anesthesia Services  Pre-Admission Testing Clinical Review / Preoperative Anesthesia Consult  Date: 04/24/22  Patient Demographics:  Name: Nicole Schmidt DOB:   05-20-1965 MRN:   [id number]  Planned Surgical Procedure(s):    Case: [id number] Date/Time: 04/28/22 0715   Procedure: REVISON OF ARTERIOVENOUS FISTULA (BRACHIAL CEPHALIC) (Left)   Anesthesia type: General   Pre-op diagnosis: ESRD   Location: [facility] OR ROOM 08 / [place] ORS FOR ANESTHESIA GROUP   Surgeons: Katha Cabal, MD     NOTE: Available PAT nursing documentation and vital signs have been reviewed. Clinical nursing staff has updated patient's PMH/PSHx, current medication list, and drug allergies/intolerances to ensure comprehensive history available to assist in medical decision making as it pertains to the aforementioned surgical procedure and anticipated anesthetic course. Extensive review of available clinical information personally performed. [place] PMH and PSHx updated with any diagnoses/procedures that  may have been inadvertently omitted during her intake with the pre-admission testing department's nursing staff.  Clinical Discussion:  Nicole Schmidt is a 57 y.o. female who is submitted for pre-surgical anesthesia review and clearance prior to her undergoing the above procedure. Patient has never been a smoker. Pertinent PMH includes: atrial fibrillation, HFpEF, aortic atherosclerosis, PE, HTN, HLD, T2DM, hypothyroidism, thyroid goiter, ESRD on dialysis (TTS), COPD, OSAH (requires nocturnal PAP therapy), anemia, OA, sleep difficulties.   Patient is followed by cardiology Fletcher Anon, MD). She was last seen in the cardiology clinic on 01/13/2022; notes reviewed. At the time of her clinic visit, patient doing well overall from a cardiovascular perspective. Patient has chronic exertional dyspnea and lower extremity edema. Symptoms reported to be stable and at baseline. Patient denied any chest pain, PND,  orthopnea, palpitations, weakness, fatigue, vertiginous symptoms, or presyncope/syncope. Patient with a past medical history significant for cardiovascular diagnoses. Documented physical exam was grossly benign, providing no evidence of acute exacerbation and/or decompensation of the patient's known cardiovascular conditions.  Patient underwent DCCV procedure in 11/2018 for her atrial fibrillation.  Procedure restored NSR, however since that time, patient has developed refractory atrial arrhythmia.   Last TTE was performed on 05/18/2021 revealing a normal left ventricular systolic function with mild LVH; LVEF 55-60%.  RVSF normal.  There is no evidence of significant valvular regurgitation or transvalvular gradient suggestive of stenosis.  Again, patient has an atrial fibrillation diagnosis; CHA2DS2-VASc Score = 7 (sex, HFpEF, HTN, PE x 2, aortic plaque, T2DM).  Rate and rhythm currently maintained on oral metoprolol succinate.  She is chronically anticoagulated using standard dose apixaban; compliant with therapy with no evidence or reports of GI bleeding. Blood pressure well reasonably controlled at 107/67 mmHg on currently prescribed alpha blocker (clonidine) and beta-blocker (metoprolol tartrate) therapies.  Patient does have some episodes of post dialysis hypotension for which she uses midodrine as needed. Patient is on atorvastatin + ezetimibe or her HLD diagnosis and further ASCVD prevention. T2DM reasonably controlled on currently prescribed regimen; last HgbA1c was 7.2% when checked on 02/09/2021.  Patient has an OSAH diagnosis for which she uses nocturnal BiPAP therapy; patient reasonably compliant with prescribed nocturnal PAP therapy and has noticed improvement in quality of life. Patient continued on hemodialysis with a Tuesday, Thursday, Saturday schedule. Functional capacity limited by obesity, deconditioning related to sedentary lifestyle, and multiple medical comorbidities.  With that being  said, patient still felt to be able to achieve 4 METS of physical activity without experiencing and significant angina/anginal equivalent symptoms.  No changes were made to her medication regimen.  Patient to  follow-up with outpatient cardiology as already scheduled on 01/13/2022, or sooner if needed.   BREANNE Schmidt is scheduled for an REVISON OF ARTERIOVENOUS FISTULA (BRACHIAL CEPHALIC) (Left) on [id number] with Dr. [name], MD.  Given patient's past medical history significant for cardiovascular diagnoses, presurgical cardiac clearance was sought by the PAT team. Per cardiology, "this patient is optimized for surgery and may proceed with the planned procedural course with a HIGH/ACCEPTABLE risk of significant perioperative cardiovascular complications".    As previously mentioned, patient remains on daily oral anticoagulation therapy.  She has been instructed on recommendations from her cardiologist and vascular surgeon for holding her apixaban dose for 2 days prior to her procedure with plans to restart since postoperatively respectively minimized by her primary attending surgeon.  Patient has been instructed that her last dose of apixaban should be on 04/25/2022.  Patient denies previous perioperative complications with anesthesia in the past. In review of the available records, it is noted that patient underwent a general anesthetic course here at [facility] ([facility]) in 11/2021. In review of past notes from anesthesia, patient has been deemed to be difficult to intubate (mallampati III-IV; BMI > 55 kg/m).       04/19/2022    4:41 PM 02/27/2022    3:33 PM 02/27/2022    3:03 PM  Vitals with BMI  Height '5\' 4"'$   '5\' 4"'$   Weight 349 lbs  349 lbs  BMI [id number]  [id number]  Systolic  [id number]  Diastolic  67 40  Pulse   67    Providers/Specialists:   NOTE: Primary physician provider listed below. Patient may have been seen by APP or partner within same practice.    PROVIDER ROLE / SPECIALTY LAST OV  Schnier, Dolores Lory, MD Vascular Surgeon (Surgeon) 02/27/2022  Morayati, Lourdes Sledge, MD Primary Care Provider ???  Kathlyn Sacramento, MD Cardiology 01/13/2022  Randa Evens, MD Hematology 01/09/2022  Chesley Mires, MD Pulmonary Medicine 12/07/2021  Anthonette Legato, MD Nephrology ???   Allergies:  Spironolactone  Current Home Medications:   No current facility-administered medications for this encounter.    acetaminophen (TYLENOL) 325 MG tablet   albuterol (VENTOLIN HFA) 108 (90 Base) MCG/ACT inhaler   apixaban (ELIQUIS) 5 MG TABS tablet   atorvastatin (LIPITOR) 40 MG tablet   cloNIDine (CATAPRES) 0.2 MG tablet   CRANBERRY PO   ergocalciferol (VITAMIN D2) 1.25 MG (50000 UT) capsule   ezetimibe (ZETIA) 10 MG tablet   ferric gluconate 250 mg in sodium chloride 0.9 % [id number] mL   folic acid (FOLVITE) 1 MG tablet   gabapentin (NEURONTIN) 300 MG capsule   Lancets (ONETOUCH DELICA PLUS [id number]) MISC   levothyroxine (SYNTHROID) 112 MCG tablet   melatonin 5 MG TABS   metoprolol succinate (TOPROL-XL) 25 MG 24 hr tablet   midodrine (PROAMATINE) 10 MG tablet   ONETOUCH ULTRA test strip   polyethylene glycol powder (GLYCOLAX/MIRALAX) 17 GM/SCOOP powder   sevelamer carbonate (RENVELA) 800 MG tablet   traMADol (ULTRAM) 50 MG tablet   TRULICITY 1.5 0000000 SOPN   VELPHORO 500 MG chewable tablet   History:   Past Medical History:  Diagnosis Date   (HFpEF) heart failure with preserved ejection fraction ([place])    a. 10/2018 Echo: EF 60-65%, diast dysfxn, RVSP 50.7 mmHg; b. 01/2020 Echo: EF 65-70%, G2DD; c. 01/2021 Echo: EF 60-65%, no rwma, mod LVH. Nl RV fxn, mildly enlarged RV. Mod BAE. Triv MR. d. 05/2021 Echo: EF 55-60; no RWMAs, mild  LVH, mild TR.   Acquired hypothyroidism 02/09/2020   Adenoma of left adrenal gland    Anemia    Aortic atherosclerosis (HCC)    Arthritis    Breast cancer of upper-inner quadrant of right female breast ([place])    Right breast  invasive CA and DCIS , 7 mm T1,N0,M0. Er/PR pos, her 2 negative.  Margins 1 mm.   COPD (chronic obstructive pulmonary disease) (HCC)    Diabetes mellitus type 2 in obese ([place]) 02/09/2020   Difficult intubation 09/28/2021   a.) mallampati III-IV; BMI >55 kg/m   Diverticulosis    Dyslipidemia 02/09/2020   ESBL (extended spectrum beta-lactamase) producing bacteria infection 03/20/2021   a. urine culture (+) for ESBL producing E.coli and P. mirabilis   ESRD (end stage renal disease) on dialysis ([place])    a. T-Th-Sat HD initiated 02/2021   Essential hypertension    Gout    History of kidney stones    Long term current use of anticoagulant    a.) apixaban   Menopause    age 57   Morbid obesity ([place])    OSA treated with BiPAP    a. on nocturnal PAP therapy; Trilogy 100 ventilator   Permanent atrial fibrillation ([place])    a. Dx 10/2018 in setting of PNA. b. CHA2DS2-VASc = 7 (sex, HFpEF, HTN, PE x2, aortic plaque, T2DM); c. s/p successful DCCV 11/2018. d. subsequent recurrent Afib; rate/rhythm maintained on oral metoprolol succinate; chronically anticoagulated using standard dose apixaban.   Personal history of radiation therapy    Pulmonary embolism ([place]) 10/2014   a. on chronic apixaban   Restrictive lung disease    Shingles 10/24/2021   Sleep difficulties    a.) takes melatonin   Thyroid goiter    Past Surgical History:  Procedure Laterality Date   A/V FISTULAGRAM Left 09/02/2021   Procedure: A/V Fistulagram;  Surgeon: Katha Cabal, MD;  Location: [place] [facility];  Service: Cardiovascular;  Laterality: Left;   AV FISTULA PLACEMENT Left 07/22/2021   Procedure: ARTERIOVENOUS (AV) FISTULA CREATION (RADIALCEPHALIC);  Surgeon: Katha Cabal, MD;  Location: [facility] ORS;  Service: Vascular;  Laterality: Left;   AV FISTULA PLACEMENT Left 12/09/2021   Procedure: ARTERIOVENOUS (AV) FISTULA CREATION (BRACHIAL CEPHALIC);  Surgeon: Katha Cabal, MD;  Location: [facility] ORS;  Service:  Vascular;  Laterality: Left;   BREAST BIOPSY Right 2014   breast ca   BREAST EXCISIONAL BIOPSY Right 07/19/2012   breast ca   BREAST SURGERY Right 2014   with sentinel node bx subareaolar duct excision   CARDIOVERSION N/A 11/28/2018   Procedure: CARDIOVERSION;  Surgeon: Minna Merritts, MD;  Location: [facility] ORS;  Service: Cardiovascular;  Laterality: N/A;   COLONOSCOPY WITH PROPOFOL N/A 01/30/2017   Procedure: COLONOSCOPY WITH PROPOFOL;  Surgeon: Christene Lye, MD;  Location: [facility] ENDOSCOPY;  Service: Endoscopy;  Laterality: N/A;   CYSTOSCOPY W/ RETROGRADES  06/17/2021   Procedure: CYSTOSCOPY WITH RETROGRADE PYELOGRAM;  Surgeon: Billey Co, MD;  Location: [facility] ORS;  Service: Urology;;   CYSTOSCOPY W/ URETERAL STENT PLACEMENT Left 02/14/2021   Procedure: CYSTOSCOPY WITH RETROGRADE PYELOGRAM/URETERAL STENT PLACEMENT;  Surgeon: Billey Co, MD;  Location: [facility] ORS;  Service: Urology;  Laterality: Left;   CYSTOSCOPY/URETEROSCOPY/HOLMIUM LASER/STENT PLACEMENT Left 06/17/2021   Procedure: CYSTOSCOPY/URETEROSCOPY/HOLMIUM LASER/STENT EXCHANGE;  Surgeon: Billey Co, MD;  Location: [facility] ORS;  Service: Urology;  Laterality: Left;   DIALYSIS/PERMA CATHETER INSERTION N/A 02/24/2021   Procedure: DIALYSIS/PERMA CATHETER INSERTION;  Surgeon: Algernon Huxley,  MD;  Location: [place] [facility];  Service: Cardiovascular;  Laterality: N/A;   DIALYSIS/PERMA CATHETER INSERTION N/A 12/16/2021   Procedure: DIALYSIS/PERMA CATHETER INSERTION;  Surgeon: Katha Cabal, MD;  Location: [place] [facility];  Service: Cardiovascular;  Laterality: N/A;   PERIPHERAL VASCULAR THROMBECTOMY Left 09/28/2021   Procedure: PERIPHERAL VASCULAR THROMBECTOMY;  Surgeon: Katha Cabal, MD;  Location: [place] [facility];  Service: Cardiovascular;  Laterality: Left;   Family History  Problem Relation Age of Onset   Diabetes Father    Hypertension Father    Parkinson's disease Father    Hypertension Mother     Rheum arthritis Mother    Atrial fibrillation Mother    Heart failure Mother    Heart attack Mother        died in her mid-70's.   Colon cancer Cousin 48   Breast cancer Neg Hx    Social History   Tobacco Use   Smoking status: Never    Passive exposure: Never   Smokeless tobacco: Never  Vaping Use   Vaping Use: Never used  Substance Use Topics   Alcohol use: No    Alcohol/week: 0.0 standard drinks of alcohol   Drug use: No    Pertinent Clinical Results:  LABS:   No visits with results within 3 Day(s) from this visit.  Latest known visit with results is:  Hospital Outpatient Visit on 04/21/2022  Component Date Value Ref Range Status   ABO/RH(D) 04/21/2022 O NEG   Final   Antibody Screen 04/21/2022 NEG   Final   Sample Expiration 04/21/2022 05/05/2022,2359   Final   Extend sample reason 04/21/2022    Final                   Value:NO TRANSFUSIONS OR PREGNANCY IN THE PAST 3 MONTHS Performed at [facility], [address] [place], [place] [address]    WBC 04/21/2022 8.4  4.0 - 10.5 K/uL Final   RBC 04/21/2022 3.62 (L)  3.87 - 5.11 MIL/uL Final   Hemoglobin 04/21/2022 12.2  12.0 - 15.0 g/dL Final   HCT 04/21/2022 40.0  36.0 - 46.0 % Final   MCV 04/21/2022 110.5 (H)  80.0 - 100.0 fL Final   MCH 04/21/2022 33.7  26.0 - 34.0 pg Final   MCHC 04/21/2022 30.5  30.0 - 36.0 g/dL Final   RDW 04/21/2022 15.0  11.5 - 15.5 % Final   Platelets 04/21/2022 196  150 - 400 K/uL Final   nRBC 04/21/2022 0.2  0.0 - 0.2 % Final   Neutrophils Relative % 04/21/2022 65  % Final   Neutro Abs 04/21/2022 5.5  1.7 - 7.7 K/uL Final   Lymphocytes Relative 04/21/2022 22  % Final   Lymphs Abs 04/21/2022 1.8  0.7 - 4.0 K/uL Final   Monocytes Relative 04/21/2022 9  % Final   Monocytes Absolute 04/21/2022 0.8  0.1 - 1.0 K/uL Final   Eosinophils Relative 04/21/2022 3  % Final   Eosinophils Absolute 04/21/2022 0.2  0.0 - 0.5 K/uL Final   Basophils Relative 04/21/2022 0  % Final    Basophils Absolute 04/21/2022 0.0  0.0 - 0.1 K/uL Final   WBC Morphology 04/21/2022 MORPHOLOGY UNREMARKABLE   Final   Smear Review 04/21/2022 MORPHOLOGY UNREMARKABLE   Final   Immature Granulocytes 04/21/2022 1  % Final   Abs Immature Granulocytes 04/21/2022 0.04  0.00 - 0.07 K/uL Final   Performed at [facility], [address] [place],  [place] [address]   Sodium 04/21/2022 138  135 - 145 mmol/L Final   Potassium 04/21/2022 4.0  3.5 - 5.1 mmol/L Final   Chloride 04/21/2022 97 (L)  98 - 111 mmol/L Final   CO2 04/21/2022 29  22 - 32 mmol/L Final   Glucose, Bld 04/21/2022 149 (H)  70 - 99 mg/dL Final   Glucose reference range applies only to samples taken after fasting for at least 8 hours.   BUN 04/21/2022 38 (H)  6 - 20 mg/dL Final   Creatinine, Ser 04/21/2022 7.21 (H)  0.44 - 1.00 mg/dL Final   Calcium 04/21/2022 8.4 (L)  8.9 - 10.3 mg/dL Final   GFR, Estimated 04/21/2022 6 (L)  >60 mL/min Final   Comment: (NOTE) Calculated using the CKD-EPI Creatinine Equation (2021)    Anion gap 04/21/2022 12  5 - 15 Final   Performed at [facility], [address] [place], [place] [address]    ECG: Date: 01/13/2022 Time ECG obtained: 1407 PM Rate: 98 bpm Rhythm: atrial fibrillation; IRBBB Axis (leads I and aVF): Normal Intervals: QRS 98 ms. QTc 495 ms. ST segment and T wave changes: No evidence of acute ST segment elevation or depression Comparison: Similar to previous tracing obtained on 12/05/2021   IMAGING / PROCEDURES: PULMONARY FUNCTION TESTING performed on 08/12/2021      CT ABDOMEN PELVIS WO CONTRAST performed on 07/27/2021 No acute intra-abdominal or pelvic pathology.  No bowel obstruction.  Normal appendix. Colonic diverticulosis. Left adrenal adenoma. Aortic atherosclerosis    TRANSTHORACIC ECHOCARDIOGRAM performed on 05/18/2021 Left ventricular ejection fraction, by estimation, is 55 to 60%. The left ventricle has normal function. The left  ventricle has no regional wall motion abnormalities. There is mild left ventricular hypertrophy.  Right ventricular systolic function is normal.  The mitral valve is normal in structure. No evidence of mitral valve regurgitation.  The aortic valve is tricuspid. No aortic stenosis is present.   Impression and Plan:  AZULA MACRI has been referred for pre-anesthesia review and clearance prior to her undergoing the planned anesthetic and procedural courses. Available labs, pertinent testing, and imaging results were personally reviewed by me in preparation for upcoming operative/procedural course. [facility] medical record has been updated following extensive record review and patient interview with PAT staff.   This patient has been appropriately cleared by cardiology with an overall HIGH/ACCEPTABLE risk of significant perioperative cardiovascular complications. Based on clinical review performed today (04/24/22), barring any significant acute changes in the patient's overall condition, it is anticipated that she will be able to proceed with the planned surgical intervention. Any acute changes in clinical condition may necessitate her procedure being postponed and/or cancelled. Patient will meet with anesthesia team (MD and/or CRNA) on the day of her procedure for preoperative evaluation/assessment. Questions regarding anesthetic course will be fielded at that time.   Pre-surgical instructions were reviewed with the patient during her PAT appointment, and questions were fielded to satisfaction by PAT clinical staff. She has been instructed on which medications that she will need to hold prior to surgery, as well as the ones that have been deemed safe/appropriate to take of the day of her procedure. As part of the general education provided by PAT, patient made aware both verbally and in writing, that she would need to abstain from the use of any illegal substances during her perioperative course.  She was  advised that failure to follow the provided instructions could necessitate case cancellation or result serious perioperative complications  up to and including death. Patient encouraged to contact PAT and/or her surgeon's office to discuss any questions or concerns that may arise prior to surgery; verbalized understanding.   Honor Loh, MSN, APRN, FNP-C, CEN [facility]  Peri-operative Services Nurse Practitioner Phone: [phone] Fax: [phone] 04/24/22 3:46 PM  NOTE: This note has been prepared using Dragon dictation software. Despite my best ability to proofread, there is always the potential that unintentional transcriptional errors may still occur from this process.

## 2022-04-27 MED ORDER — CHLORHEXIDINE GLUCONATE CLOTH 2 % EX PADS: 6.0000 | MEDICATED_PAD | Freq: Once | CUTANEOUS | Status: DC

## 2022-04-27 MED ORDER — ORAL CARE MOUTH RINSE: 15.0000 mL | Freq: Once | OROMUCOSAL | Status: AC

## 2022-04-27 MED ORDER — CEFAZOLIN SODIUM-DEXTROSE 2-4 GM/100ML-% IV SOLN
2.0000 g | INTRAVENOUS | Status: AC
  Administered 2022-04-28: 1 g via INTRAVENOUS
  Administered 2022-04-28: 2 g via INTRAVENOUS

## 2022-04-27 MED ORDER — SODIUM CHLORIDE 0.9 % IV SOLN: INTRAVENOUS | Status: DC

## 2022-04-27 MED ORDER — FAMOTIDINE 20 MG PO TABS: 20.0000 mg | ORAL_TABLET | Freq: Once | ORAL | Status: AC

## 2022-04-27 MED ORDER — CHLORHEXIDINE GLUCONATE 0.12 % MT SOLN: 15.0000 mL | Freq: Once | OROMUCOSAL | Status: AC

## 2022-04-28 ENCOUNTER — Other Ambulatory Visit: Payer: Self-pay

## 2022-04-28 ENCOUNTER — Encounter: Payer: Self-pay | Admitting: Vascular Surgery

## 2022-04-28 ENCOUNTER — Ambulatory Visit: Payer: BC Managed Care – PPO | Admitting: Urgent Care

## 2022-04-28 ENCOUNTER — Encounter
Admission: RE | Disposition: A | Discharge status: Home / Self Care | Payer: Self-pay | Source: Ambulatory Visit | Attending: Vascular Surgery

## 2022-04-28 ENCOUNTER — Ambulatory Visit
Admission: RE | Admit: 2022-04-28 | Discharge: 2022-04-28 | Disposition: A | Discharge status: Home / Self Care | Payer: BC Managed Care – PPO | Source: Ambulatory Visit | Attending: Vascular Surgery | Admitting: Vascular Surgery

## 2022-04-28 DIAGNOSIS — Z992 Dependence on renal dialysis: Secondary | ICD-10-CM | POA: Diagnosis not present

## 2022-04-28 DIAGNOSIS — I4819 Other persistent atrial fibrillation: Secondary | ICD-10-CM | POA: Insufficient documentation

## 2022-04-28 DIAGNOSIS — Y832 Surgical operation with anastomosis, bypass or graft as the cause of abnormal reaction of the patient, or of later complication, without mention of misadventure at the time of the procedure: Secondary | ICD-10-CM | POA: Diagnosis not present

## 2022-04-28 DIAGNOSIS — N186 End stage renal disease: Secondary | ICD-10-CM | POA: Insufficient documentation

## 2022-04-28 DIAGNOSIS — E785 Hyperlipidemia, unspecified: Secondary | ICD-10-CM | POA: Insufficient documentation

## 2022-04-28 DIAGNOSIS — E039 Hypothyroidism, unspecified: Secondary | ICD-10-CM | POA: Insufficient documentation

## 2022-04-28 DIAGNOSIS — Z7901 Long term (current) use of anticoagulants: Secondary | ICD-10-CM | POA: Diagnosis not present

## 2022-04-28 DIAGNOSIS — Z923 Personal history of irradiation: Secondary | ICD-10-CM | POA: Insufficient documentation

## 2022-04-28 DIAGNOSIS — E1165 Type 2 diabetes mellitus with hyperglycemia: Secondary | ICD-10-CM

## 2022-04-28 DIAGNOSIS — I132 Hypertensive heart and chronic kidney disease with heart failure and with stage 5 chronic kidney disease, or end stage renal disease: Secondary | ICD-10-CM | POA: Insufficient documentation

## 2022-04-28 DIAGNOSIS — Z853 Personal history of malignant neoplasm of breast: Secondary | ICD-10-CM | POA: Insufficient documentation

## 2022-04-28 DIAGNOSIS — E1122 Type 2 diabetes mellitus with diabetic chronic kidney disease: Secondary | ICD-10-CM | POA: Insufficient documentation

## 2022-04-28 DIAGNOSIS — Z86711 Personal history of pulmonary embolism: Secondary | ICD-10-CM | POA: Diagnosis not present

## 2022-04-28 DIAGNOSIS — Z6841 Body Mass Index (BMI) 40.0 and over, adult: Secondary | ICD-10-CM | POA: Insufficient documentation

## 2022-04-28 DIAGNOSIS — T82590A Other mechanical complication of surgically created arteriovenous fistula, initial encounter: Secondary | ICD-10-CM | POA: Insufficient documentation

## 2022-04-28 DIAGNOSIS — T82898A Other specified complication of vascular prosthetic devices, implants and grafts, initial encounter: Secondary | ICD-10-CM | POA: Diagnosis not present

## 2022-04-28 DIAGNOSIS — I5030 Unspecified diastolic (congestive) heart failure: Secondary | ICD-10-CM | POA: Insufficient documentation

## 2022-04-28 DIAGNOSIS — N183 Chronic kidney disease, stage 3 unspecified: Secondary | ICD-10-CM

## 2022-04-28 DIAGNOSIS — E1169 Type 2 diabetes mellitus with other specified complication: Secondary | ICD-10-CM | POA: Diagnosis not present

## 2022-04-28 DIAGNOSIS — I509 Heart failure, unspecified: Secondary | ICD-10-CM | POA: Diagnosis not present

## 2022-04-28 HISTORY — PX: REVISON OF ARTERIOVENOUS FISTULA: SHX6074

## 2022-04-28 LAB — POCT I-STAT, CHEM 8
BUN: 28 mg/dL — ABNORMAL HIGH (ref 6–20)
Calcium, Ion: 1.02 mmol/L — ABNORMAL LOW (ref 1.15–1.40)
Chloride: 97 mmol/L — ABNORMAL LOW (ref 98–111)
Creatinine, Ser: 7.1 mg/dL — ABNORMAL HIGH (ref 0.44–1.00)
Glucose, Bld: 131 mg/dL — ABNORMAL HIGH (ref 70–99)
HCT: 39 % (ref 36.0–46.0)
Hemoglobin: 13.3 g/dL (ref 12.0–15.0)
Potassium: 3.8 mmol/L (ref 3.5–5.1)
Sodium: 137 mmol/L (ref 135–145)
TCO2: 31 mmol/L (ref 22–32)

## 2022-04-28 LAB — GLUCOSE, CAPILLARY: Glucose-Capillary: 129 mg/dL — ABNORMAL HIGH (ref 70–99)

## 2022-04-28 SURGERY — REVISON OF ARTERIOVENOUS FISTULA
Anesthesia: General | Site: Arm Upper | Laterality: Left

## 2022-04-28 MED ORDER — GLYCOPYRROLATE 0.2 MG/ML IJ SOLN
INTRAMUSCULAR | Status: DC | PRN
  Administered 2022-04-28: .2 mg via INTRAVENOUS

## 2022-04-28 MED ORDER — OXYCODONE HCL 5 MG PO TABS: 5.0000 mg | ORAL_TABLET | Freq: Once | ORAL | Status: AC | PRN

## 2022-04-28 MED ORDER — BUPIVACAINE LIPOSOME 1.3 % IJ SUSP
INTRAMUSCULAR | Status: AC
  Filled 2022-04-28: qty 20

## 2022-04-28 MED ORDER — BUPIVACAINE LIPOSOME 1.3 % IJ SUSP
INTRAMUSCULAR | Status: DC | PRN
  Administered 2022-04-28: 20 mL

## 2022-04-28 MED ORDER — HYDROCODONE-ACETAMINOPHEN 5-325 MG PO TABS: 1.0000 | ORAL_TABLET | Freq: Four times a day (QID) | ORAL | 0 refills | Status: DC | PRN

## 2022-04-28 MED ORDER — FAMOTIDINE 20 MG PO TABS
ORAL_TABLET | ORAL | Status: AC
  Administered 2022-04-28: 20 mg via ORAL
  Filled 2022-04-28: qty 1

## 2022-04-28 MED ORDER — FENTANYL CITRATE (PF) 100 MCG/2ML IJ SOLN: 25.0000 ug | INTRAMUSCULAR | Status: DC | PRN

## 2022-04-28 MED ORDER — EPHEDRINE SULFATE (PRESSORS) 50 MG/ML IJ SOLN
INTRAMUSCULAR | Status: DC | PRN
  Administered 2022-04-28 (×2): 5 mg via INTRAVENOUS

## 2022-04-28 MED ORDER — LIDOCAINE HCL (CARDIAC) PF 100 MG/5ML IV SOSY
PREFILLED_SYRINGE | INTRAVENOUS | Status: DC | PRN
  Administered 2022-04-28: 100 mg via INTRAVENOUS

## 2022-04-28 MED ORDER — CHLORHEXIDINE GLUCONATE 0.12 % MT SOLN
OROMUCOSAL | Status: AC
  Administered 2022-04-28: 15 mL via OROMUCOSAL
  Filled 2022-04-28: qty 15

## 2022-04-28 MED ORDER — OXYCODONE HCL 5 MG PO TABS
ORAL_TABLET | ORAL | Status: AC
  Administered 2022-04-28: 5 mg via ORAL
  Filled 2022-04-28: qty 1

## 2022-04-28 MED ORDER — CEFAZOLIN SODIUM-DEXTROSE 2-4 GM/100ML-% IV SOLN
INTRAVENOUS | Status: AC
  Filled 2022-04-28: qty 100

## 2022-04-28 MED ORDER — ONDANSETRON HCL 4 MG/2ML IJ SOLN: 4.0000 mg | Freq: Four times a day (QID) | INTRAMUSCULAR | Status: DC | PRN

## 2022-04-28 MED ORDER — SODIUM CHLORIDE 0.9 % IR SOLN: Status: DC | PRN

## 2022-04-28 MED ORDER — ONDANSETRON HCL 4 MG/2ML IJ SOLN: 4.0000 mg | Freq: Once | INTRAMUSCULAR | Status: DC | PRN

## 2022-04-28 MED ORDER — OXYCODONE HCL 5 MG/5ML PO SOLN: 5.0000 mg | Freq: Once | ORAL | Status: AC | PRN

## 2022-04-28 MED ORDER — PROPOFOL 1000 MG/100ML IV EMUL
INTRAVENOUS | Status: AC
  Filled 2022-04-28: qty 100

## 2022-04-28 MED ORDER — BUPIVACAINE HCL (PF) 0.5 % IJ SOLN
INTRAMUSCULAR | Status: DC | PRN
  Administered 2022-04-28: 30 mL

## 2022-04-28 MED ORDER — CEFAZOLIN SODIUM-DEXTROSE 1-4 GM/50ML-% IV SOLN
INTRAVENOUS | Status: AC
  Filled 2022-04-28: qty 50

## 2022-04-28 MED ORDER — FENTANYL CITRATE (PF) 100 MCG/2ML IJ SOLN
INTRAMUSCULAR | Status: AC
  Filled 2022-04-28: qty 2

## 2022-04-28 MED ORDER — PHENYLEPHRINE 80 MCG/ML (10ML) SYRINGE FOR IV PUSH (FOR BLOOD PRESSURE SUPPORT)
PREFILLED_SYRINGE | INTRAVENOUS | Status: DC | PRN
  Administered 2022-04-28: 160 ug via INTRAVENOUS
  Administered 2022-04-28: 80 ug via INTRAVENOUS

## 2022-04-28 MED ORDER — PROPOFOL 10 MG/ML IV BOLUS
INTRAVENOUS | Status: DC | PRN
  Administered 2022-04-28: 50 mg via INTRAVENOUS
  Administered 2022-04-28: 150 mg via INTRAVENOUS

## 2022-04-28 MED ORDER — PHENYLEPHRINE HCL-NACL 20-0.9 MG/250ML-% IV SOLN
INTRAVENOUS | Status: DC | PRN
  Administered 2022-04-28: 25 ug/min via INTRAVENOUS

## 2022-04-28 MED ORDER — HEPARIN SODIUM (PORCINE) 5000 UNIT/ML IJ SOLN
INTRAMUSCULAR | Status: AC
  Filled 2022-04-28: qty 1

## 2022-04-28 MED ORDER — BUPIVACAINE HCL (PF) 0.5 % IJ SOLN
INTRAMUSCULAR | Status: AC
  Filled 2022-04-28: qty 30

## 2022-04-28 MED ORDER — HYDROMORPHONE HCL 1 MG/ML IJ SOLN: 1.0000 mg | Freq: Once | INTRAMUSCULAR | Status: DC | PRN

## 2022-04-28 MED ORDER — FENTANYL CITRATE (PF) 100 MCG/2ML IJ SOLN
INTRAMUSCULAR | Status: DC | PRN
  Administered 2022-04-28: 50 ug via INTRAVENOUS

## 2022-04-28 MED ORDER — MIDAZOLAM HCL 2 MG/2ML IJ SOLN
INTRAMUSCULAR | Status: AC
  Filled 2022-04-28: qty 2

## 2022-04-28 MED ORDER — ACETAMINOPHEN 10 MG/ML IV SOLN: 1000.0000 mg | Freq: Once | INTRAVENOUS | Status: DC | PRN

## 2022-04-28 MED ORDER — ONDANSETRON HCL 4 MG/2ML IJ SOLN
INTRAMUSCULAR | Status: DC | PRN
  Administered 2022-04-28 (×2): 4 mg via INTRAVENOUS

## 2022-04-28 MED ORDER — SUCCINYLCHOLINE CHLORIDE 200 MG/10ML IV SOSY
PREFILLED_SYRINGE | INTRAVENOUS | Status: DC | PRN
  Administered 2022-04-28: 180 mg via INTRAVENOUS

## 2022-04-28 SURGICAL SUPPLY — 71 items
ADH SKN CLS APL DERMABOND .7 (GAUZE/BANDAGES/DRESSINGS) ×2
APL PRP STRL LF DISP 70% ISPRP (MISCELLANEOUS) ×1
APPLIER CLIP 11 MED OPEN (CLIP)
APPLIER CLIP 9.375 SM OPEN (CLIP)
APR CLP MED 11 20 MLT OPN (CLIP)
APR CLP SM 9.3 20 MLT OPN (CLIP)
BAG DECANTER FOR FLEXI CONT (MISCELLANEOUS) ×1 IMPLANT
BLADE SURG 15 STRL LF DISP TIS (BLADE) ×1 IMPLANT
BLADE SURG 15 STRL SS (BLADE) ×1
BLADE SURG SZ11 CARB STEEL (BLADE) ×1 IMPLANT
BOOT SUTURE VASCULAR YLW (MISCELLANEOUS) ×1
BRUSH SCRUB EZ  4% CHG (MISCELLANEOUS) ×1
BRUSH SCRUB EZ 4% CHG (MISCELLANEOUS) ×1 IMPLANT
CHLORAPREP W/TINT 26 (MISCELLANEOUS) ×1 IMPLANT
CLAMP SUTURE YELLOW 5 PAIRS (MISCELLANEOUS) ×1 IMPLANT
CLIP APPLIE 11 MED OPEN (CLIP) IMPLANT
CLIP APPLIE 9.375 SM OPEN (CLIP) IMPLANT
DERMABOND ADVANCED .7 DNX12 (GAUZE/BANDAGES/DRESSINGS) ×1 IMPLANT
DRESSING SURGICEL FIBRLLR 1X2 (HEMOSTASIS) ×1 IMPLANT
DRSG SURGICEL FIBRILLAR 1X2 (HEMOSTASIS) ×1
ELECT CAUTERY BLADE 6.4 (BLADE) ×1 IMPLANT
ELECT REM PT RETURN 9FT ADLT (ELECTROSURGICAL) ×1
ELECTRODE REM PT RTRN 9FT ADLT (ELECTROSURGICAL) ×1 IMPLANT
GEL ULTRASOUND 20GR AQUASONIC (MISCELLANEOUS) ×1 IMPLANT
GLOVE BIO SURGEON STRL SZ7 (GLOVE) ×1 IMPLANT
GLOVE SURG SYN 7.0 (GLOVE) ×1 IMPLANT
GLOVE SURG SYN 7.0 PF PI (GLOVE) ×1 IMPLANT
GLOVE SURG SYN 8.0 (GLOVE) ×1 IMPLANT
GLOVE SURG SYN 8.0 PF PI (GLOVE) ×1 IMPLANT
GOWN STRL REUS W/ TWL LRG LVL3 (GOWN DISPOSABLE) ×3 IMPLANT
GOWN STRL REUS W/ TWL XL LVL3 (GOWN DISPOSABLE) ×1 IMPLANT
GOWN STRL REUS W/TWL LRG LVL3 (GOWN DISPOSABLE) ×3
GOWN STRL REUS W/TWL XL LVL3 (GOWN DISPOSABLE) ×1
IV NS 100ML SINGLE PACK (IV SOLUTION) IMPLANT
IV NS 500ML (IV SOLUTION) ×1
IV NS 500ML BAXH (IV SOLUTION) ×1 IMPLANT
KIT TURNOVER KIT A (KITS) ×1 IMPLANT
LABEL OR SOLS (LABEL) ×1 IMPLANT
LOOP VESSEL MAXI  1X406 RED (MISCELLANEOUS) ×1
LOOP VESSEL MAXI 1X406 RED (MISCELLANEOUS) ×1 IMPLANT
LOOP VESSEL MINI 0.8X406 BLUE (MISCELLANEOUS) ×2 IMPLANT
MANIFOLD NEPTUNE II (INSTRUMENTS) ×1 IMPLANT
NDL FILTER BLUNT 18X1 1/2 (NEEDLE) ×1 IMPLANT
NEEDLE FILTER BLUNT 18X1 1/2 (NEEDLE) ×1 IMPLANT
NS IRRIG 500ML POUR BTL (IV SOLUTION) ×1 IMPLANT
PACK EXTREMITY ARMC (MISCELLANEOUS) ×1 IMPLANT
PAD PREP 24X41 OB/GYN DISP (PERSONAL CARE ITEMS) ×1 IMPLANT
SPIKE FLUID TRANSFER (MISCELLANEOUS) ×1 IMPLANT
STOCKINETTE 48X4 2 PLY STRL (GAUZE/BANDAGES/DRESSINGS) ×1 IMPLANT
STOCKINETTE STRL 4IN 9604848 (GAUZE/BANDAGES/DRESSINGS) ×1 IMPLANT
SUT MNCRL 4-0 (SUTURE) ×3
SUT MNCRL 4-0 27XMFL (SUTURE) ×3
SUT MNCRL+ 5-0 UNDYED PC-3 (SUTURE) ×1 IMPLANT
SUT MONOCRYL 5-0 (SUTURE) ×1
SUT PROLENE 6 0 BV (SUTURE) ×4 IMPLANT
SUT SILK 2 0 (SUTURE) ×1
SUT SILK 2-0 18XBRD TIE 12 (SUTURE) ×1 IMPLANT
SUT SILK 3 0 (SUTURE) ×1
SUT SILK 3-0 18XBRD TIE 12 (SUTURE) ×1 IMPLANT
SUT SILK 4 0 (SUTURE) ×1
SUT SILK 4-0 18XBRD TIE 12 (SUTURE) ×1 IMPLANT
SUT VIC AB 3-0 SH 27 (SUTURE) ×2
SUT VIC AB 3-0 SH 27X BRD (SUTURE) ×2 IMPLANT
SUTURE MNCRL 4-0 27XMF (SUTURE) IMPLANT
SYR 20ML LL LF (SYRINGE) ×1 IMPLANT
SYR 3ML LL SCALE MARK (SYRINGE) ×1 IMPLANT
SYR TOOMEY IRRIG 70ML (MISCELLANEOUS)
SYRINGE TOOMEY IRRIG 70ML (MISCELLANEOUS) IMPLANT
TAG SUTURE CLAMP YLW 5PR (MISCELLANEOUS) ×1
TRAP FLUID SMOKE EVACUATOR (MISCELLANEOUS) ×1 IMPLANT
WATER STERILE IRR 500ML POUR (IV SOLUTION) ×1 IMPLANT

## 2022-04-28 NOTE — Discharge Instructions (Addendum)
AMBULATORY SURGERY  DISCHARGE INSTRUCTIONS   The drugs that you were given will stay in your system until tomorrow so for the next 24 hours you should not:  Drive an automobile Make any legal decisions Drink any alcoholic beverage   You may resume regular meals tomorrow.  Today it is better to start with liquids and gradually work up to solid foods.  You may eat anything you prefer, but it is better to start with liquids, then soup and crackers, and gradually work up to solid foods.   Please notify your doctor immediately if you have any unusual bleeding, trouble breathing, redness and pain at the surgery site, drainage, fever, or pain not relieved by medication.    Additional Instructions:     DO NOT REMOVE TEAL BRACELET FOR 4 DAYS, (96 hours) 05/02/2022    Information for Discharge Teaching: EXPAREL (bupivacaine liposome injectable suspension)   Your surgeon or anesthesiologist gave you EXPAREL(bupivacaine) to help control your pain after surgery.  EXPAREL is a local anesthetic that provides pain relief by numbing the tissue around the surgical site. EXPAREL is designed to release pain medication over time and can control pain for up to 72 hours. Depending on how you respond to EXPAREL, you may require less pain medication during your recovery.  Possible side effects: Temporary loss of sensation or ability to move in the area where bupivacaine was injected. Nausea, vomiting, constipation Rarely, numbness and tingling in your mouth or lips, lightheadedness, or anxiety may occur. Call your doctor right away if you think you may be experiencing any of these sensations, or if you have other questions regarding possible side effects.  Follow all other discharge instructions given to you by your surgeon or nurse. Eat a healthy diet and drink plenty of water or other fluids.  If you return to the hospital for any reason within 96 hours following the administration of EXPAREL, it is  important for health care providers to know that you have received this anesthetic. A teal colored band has been placed on your arm with the date, time and amount of EXPAREL you have received in order to alert and inform your health care providers. Please leave this armband in place for the full 96 hours following administration, and then you may remove the band.   Please contact your physician with any problems or Same Day Surgery at [phone], Monday through Friday 6 am to 4 pm, or [place] at [facility] number at [phone].

## 2022-04-28 NOTE — Anesthesia Preprocedure Evaluation (Signed)
Anesthesia Evaluation  Patient identified by MRN, date of birth, ID band Patient awake  General Assessment Comment:  In past anesthetic, patient retaining CO2 upon extubation, needing BiPAP in PACU. Easy intubations in prior records.  Reviewed: Allergy & Precautions, NPO status , Patient's Chart, lab work & pertinent test results  History of Anesthesia Complications Negative for: history of anesthetic complications  Airway Mallampati: I  TM Distance: >3 FB Neck ROM: Full    Dental  (+) Missing, Chipped,    Pulmonary sleep apnea, Continuous Positive Airway Pressure Ventilation and Oxygen sleep apnea , COPD, Patient abstained from smoking.Not current smoker, PE H/o PE 2016  2L at night  restrictive lung disease    + decreased breath sounds      Cardiovascular Exercise Tolerance: Poor METS: < 3 Mets hypertension, Pt. on medications +CHF  (-) CAD and (-) Past MI + dysrhythmias Atrial Fibrillation  Rhythm:Regular Rate:Normal - Systolic murmurs 1. Left ventricular ejection fraction, by estimation, is 60 to 65%. The  left ventricle has normal function. Left ventricular endocardial border  not optimally defined to evaluate regional wall motion. There is moderate  left ventricular hypertrophy. Left  ventricular diastolic function could not be evaluated.   2. Right ventricular systolic function is normal. The right ventricular  size is mildly enlarged.   3. Left atrial size was moderately dilated.   4. Right atrial size was moderately dilated.   5. The mitral valve is normal in structure. Trivial mitral valve  regurgitation.   6. The aortic valve has an indeterminant number of cusps. Aortic valve  regurgitation is not visualized. No aortic stenosis is present.   7. The inferior vena cava is normal in size with greater than 50%  respiratory variability, suggesting right atrial pressure of 3 mmHg.    Neuro/Psych negative  neurological ROS  negative psych ROS   GI/Hepatic ,neg GERD  ,,(+)     (-) substance abuse    Endo/Other  diabetes, Well Controlled, Type 2Hypothyroidism  Morbid obesityOn weekly GLP1 injection, last taken 10 days ago  Renal/GU ESRF and DialysisRenal diseaseDialyzed yesterday     Musculoskeletal  (+) Arthritis ,    Abdominal  (+) + obese  Peds  Hematology   Anesthesia Other Findings Past Medical History: No date: (HFpEF) heart failure with preserved ejection fraction ([place])     Comment:  a. 10/2018 Echo: EF 60-65%, diast dysfxn, RVSP 50.7 mmHg,              mildly dil LA. 02/09/2020: Acquired hypothyroidism No date: Anemia No date: Arthritis 2014: Breast cancer ([place])     Comment:  right breast cancer No date: Breast cancer of upper-inner quadrant of right female breast  ([place])     Comment:  Right breast invasive CA and DCIS , 7 mm T1,N0,M0. Er/PR              pos, her 2 negative.  Margins 1 mm. No date: CHF (congestive heart failure) (HCC) No date: CKD (chronic kidney disease), stage III ([place]) 02/09/2020: Diabetes mellitus type 2 in obese ([place]) No date: Diabetes mellitus without complication ([place]) [id number]: Dyslipidemia No date: Essential hypertension No date: Gout No date: Hypothyroidism No date: Menopause     Comment:  age 57 No date: Morbid obesity ([place]) No date: Persistent atrial fibrillation ([place])     Comment:  a. Dx 10/2018 in setting of PNA. CHA2DS2VASc =               4-->Eliquis; b.  11/2018 s/p successful DCCV. No date: Personal history of radiation therapy 10/2014: Pulmonary embolism (HCC)     Comment:  a. Chronic eliquis. No date: Thyroid goiter  Reproductive/Obstetrics                              Anesthesia Physical Anesthesia Plan  ASA: 4  Anesthesia Plan: General   Post-op Pain Management: Ofirmev IV (intra-op)   Induction: Intravenous and Rapid sequence  PONV Risk Score and Plan: 3 and Ondansetron, Dexamethasone  and Treatment may vary due to age or medical condition  Airway Management Planned: Oral ETT and Video Laryngoscope Planned  Additional Equipment: None  Intra-op Plan:   Post-operative Plan: Extubation in OR  Informed Consent: I have reviewed the patients History and Physical, chart, labs and discussed the procedure including the risks, benefits and alternatives for the proposed anesthesia with the patient or authorized representative who has indicated his/her understanding and acceptance.     Dental advisory given  Plan Discussed with: CRNA and Surgeon  Anesthesia Plan Comments: (Discussed risks of anesthesia with patient, including PONV, sore throat, lip/dental/eye damage. Rare risks discussed as well, such as cardiorespiratory and neurological sequelae, and allergic reactions. Discussed the role of CRNA in patient's perioperative care. Patient understands. Patient informed about increased incidence of above perioperative risk due to high BMI. Patient understands.  Patient has her CPAP her, can use in PACU.)         Anesthesia Quick Evaluation

## 2022-04-28 NOTE — Progress Notes (Signed)
MRN : [id number]  Nicole Schmidt is a 57 y.o. (1965/11/23) female who presents with chief complaint of check access.  History of Present Illness:  The patient presents to [facility] for revision of their dialysis access.    She is s/p left brachial cephalic fistula creation on 12/09/2021.   Currently she is using her catheter for HD.  Catheter is a little tender.  The patient denies hand pain or other symptoms consistent with steal phenomena.  No significant arm swelling.   The patient denies any complaints from the dialysis center or their nephrologist.   The patient denies redness or swelling at the access site. The patient denies fever or chills at home or while on dialysis.   Duplex ultrasound of the AV access shows a patent access.  The cephalic vein now measures 6 to 7 mm in diameter.  However it is an inch deep therefore not acceptable as access.  Today flow volume today is 903 cc/min     Current Meds  Medication Sig   acetaminophen (TYLENOL) 325 MG tablet Take 2 tablets (650 mg total) by mouth every 6 (six) hours as needed for mild pain (or Fever >/= 101).   atorvastatin (LIPITOR) 40 MG tablet Take 40 mg by mouth daily.   cloNIDine (CATAPRES) 0.2 MG tablet Take 0.2 mg by mouth 2 (two) times daily.   CRANBERRY PO Take 1 capsule by mouth daily.   ergocalciferol (VITAMIN D2) 1.25 MG (50000 UT) capsule Take 1 capsule (50,000 Units total) by mouth once a week.   ezetimibe (ZETIA) 10 MG tablet Take 10 mg by mouth daily with supper.   ferric gluconate 250 mg in sodium chloride 0.9 % 250 mL Inject 250 mg into the vein Every Tuesday,Thursday,and Saturday with dialysis.   folic acid (FOLVITE) 1 MG tablet Take 1 tablet (1 mg total) by mouth daily.   gabapentin (NEURONTIN) 300 MG capsule Take 1 capsule (300 mg total) by mouth at bedtime.   levothyroxine (SYNTHROID) 112 MCG tablet Take 1 tablet (112 mcg total) by mouth daily before breakfast.   melatonin 5 MG TABS Take  2 tablets (10 mg total) by mouth at bedtime as needed (sleep).   metoprolol succinate (TOPROL-XL) 25 MG 24 hr tablet Take 1 tablet (25 mg total) by mouth daily. Take with or immediately following a meal. (Patient taking differently: Take 25 mg by mouth at bedtime. Take with or immediately following a meal.)   midodrine (PROAMATINE) 10 MG tablet Take 1 tablet (10 mg total) by mouth Every Tuesday,Thursday,and Saturday with dialysis.   polyethylene glycol powder (GLYCOLAX/MIRALAX) 17 GM/SCOOP powder Take 1 Container by mouth once.   sevelamer carbonate (RENVELA) 800 MG tablet Take 1 tablet (800 mg total) by mouth 3 (three) times daily with meals.   traMADol (ULTRAM) 50 MG tablet Take 1 tablet (50 mg total) by mouth every 12 (twelve) hours as needed for moderate pain.   VELPHORO 500 MG chewable tablet Chew 500 mg by mouth 3 (three) times daily.    Past Medical History:  Diagnosis Date   (HFpEF) heart failure with preserved ejection fraction ([place])    a. 10/2018 Echo: EF 60-65%, diast dysfxn, RVSP 50.7 mmHg; b. 01/2020 Echo: EF 65-70%, G2DD; c. 01/2021 Echo: EF 60-65%, no rwma, mod LVH. Nl RV fxn, mildly enlarged RV. Mod BAE. Triv MR. d. 05/2021 Echo: EF 55-60;  no RWMAs, mild LVH, mild TR.   Acquired hypothyroidism 02/09/2020   Adenoma of left adrenal gland    Anemia    Aortic atherosclerosis (HCC)    Arthritis    Breast cancer of upper-inner quadrant of right female breast ([place])    Right breast invasive CA and DCIS , 7 mm T1,N0,M0. Er/PR pos, her 2 negative.  Margins 1 mm.   COPD (chronic obstructive pulmonary disease) (HCC)    Diabetes mellitus type 2 in obese ([place]) 02/09/2020   Difficult intubation 09/28/2021   a.) mallampati III-IV; BMI >55 kg/m   Diverticulosis    Dyslipidemia 02/09/2020   ESBL (extended spectrum beta-lactamase) producing bacteria infection 03/20/2021   a. urine culture (+) for ESBL producing E.coli and P. mirabilis   ESRD (end stage renal disease) on dialysis ([place])    a.  T-Th-Sat HD initiated 02/2021   Essential hypertension    Gout    History of kidney stones    Long term current use of anticoagulant    a.) apixaban   Menopause    age 58   Morbid obesity ([place])    OSA treated with BiPAP    a. on nocturnal PAP therapy; Trilogy 100 ventilator   Permanent atrial fibrillation ([place])    a. Dx 10/2018 in setting of PNA. b. CHA2DS2-VASc = 7 (sex, HFpEF, HTN, PE x2, aortic plaque, T2DM); c. s/p successful DCCV 11/2018. d. subsequent recurrent Afib; rate/rhythm maintained on oral metoprolol succinate; chronically anticoagulated using standard dose apixaban.   Personal history of radiation therapy    Pulmonary embolism ([place]) 10/2014   a. on chronic apixaban   Restrictive lung disease    Shingles 10/24/2021   Sleep difficulties    a.) takes melatonin   Thyroid goiter     Past Surgical History:  Procedure Laterality Date   A/V FISTULAGRAM Left 09/02/2021   Procedure: A/V Fistulagram;  Surgeon: Katha Cabal, MD;  Location: [facility] [facility];  Service: Cardiovascular;  Laterality: Left;   AV FISTULA PLACEMENT Left 07/22/2021   Procedure: ARTERIOVENOUS (AV) FISTULA CREATION (RADIALCEPHALIC);  Surgeon: Katha Cabal, MD;  Location: [facility];  Service: Vascular;  Laterality: Left;   AV FISTULA PLACEMENT Left 12/09/2021   Procedure: ARTERIOVENOUS (AV) FISTULA CREATION (BRACHIAL CEPHALIC);  Surgeon: Katha Cabal, MD;  Location: [facility];  Service: Vascular;  Laterality: Left;   BREAST BIOPSY Right 2014   breast ca   BREAST EXCISIONAL BIOPSY Right 07/19/2012   breast ca   BREAST SURGERY Right 2014   with sentinel node bx subareaolar duct excision   CARDIOVERSION N/A 11/28/2018   Procedure: CARDIOVERSION;  Surgeon: Minna Merritts, MD;  Location: [facility];  Service: Cardiovascular;  Laterality: N/A;   COLONOSCOPY WITH PROPOFOL N/A 01/30/2017   Procedure: COLONOSCOPY WITH PROPOFOL;  Surgeon: Christene Lye, MD;  Location: [facility];   Service: Endoscopy;  Laterality: N/A;   CYSTOSCOPY W/ RETROGRADES  06/17/2021   Procedure: CYSTOSCOPY WITH RETROGRADE PYELOGRAM;  Surgeon: Billey Co, MD;  Location: [facility];  Service: Urology;;   CYSTOSCOPY W/ URETERAL STENT PLACEMENT Left 02/14/2021   Procedure: CYSTOSCOPY WITH RETROGRADE PYELOGRAM/URETERAL STENT PLACEMENT;  Surgeon: Billey Co, MD;  Location: [facility];  Service: Urology;  Laterality: Left;   CYSTOSCOPY/URETEROSCOPY/HOLMIUM LASER/STENT PLACEMENT Left 06/17/2021   Procedure: CYSTOSCOPY/URETEROSCOPY/HOLMIUM LASER/STENT EXCHANGE;  Surgeon: Billey Co, MD;  Location: [facility];  Service: Urology;  Laterality: Left;   DIALYSIS/PERMA CATHETER INSERTION N/A 02/24/2021   Procedure: DIALYSIS/PERMA CATHETER INSERTION;  Surgeon: Algernon Huxley, MD;  Location: [place] [facility];  Service: Cardiovascular;  Laterality: N/A;   DIALYSIS/PERMA CATHETER INSERTION N/A 12/16/2021   Procedure: DIALYSIS/PERMA CATHETER INSERTION;  Surgeon: Katha Cabal, MD;  Location: [place] [facility];  Service: Cardiovascular;  Laterality: N/A;   PERIPHERAL VASCULAR THROMBECTOMY Left 09/28/2021   Procedure: PERIPHERAL VASCULAR THROMBECTOMY;  Surgeon: Katha Cabal, MD;  Location: [place] [facility];  Service: Cardiovascular;  Laterality: Left;    Social History Social History   Tobacco Use   Smoking status: Never    Passive exposure: Never   Smokeless tobacco: Never  Vaping Use   Vaping Use: Never used  Substance Use Topics   Alcohol use: No    Alcohol/week: 0.0 standard drinks of alcohol   Drug use: No    Family History Family History  Problem Relation Age of Onset   Diabetes Father    Hypertension Father    Parkinson's disease Father    Hypertension Mother    Rheum arthritis Mother    Atrial fibrillation Mother    Heart failure Mother    Heart attack Mother        died in her mid-70's.   Colon cancer Cousin 54   Breast cancer Neg Hx     Allergies  Allergen  Reactions   Spironolactone     Hyperkalemia  Pt unaware of this allergy     REVIEW OF SYSTEMS (Negative unless checked)  Constitutional: [] Weight loss  [] Fever  [] Chills Cardiac: [] Chest pain   [] Chest pressure   [] Palpitations   [] Shortness of breath when laying flat   [] Shortness of breath with exertion. Vascular:  [] Pain in legs with walking   [] Pain in legs at rest  [] History of DVT   [] Phlebitis   [] Swelling in legs   [] Varicose veins   [] Non-healing ulcers Pulmonary:   [] Uses home oxygen   [] Productive cough   [] Hemoptysis   [] Wheeze  [x] COPD   [] Asthma Neurologic:  [] Dizziness   [] Seizures   [] History of stroke   [] History of TIA  [] Aphasia   [] Vissual changes   [] Weakness or numbness in arm   [] Weakness or numbness in leg Musculoskeletal:   [] Joint swelling   [] Joint pain   [] Low back pain Hematologic:  [] Easy bruising  [] Easy bleeding   [x] Hypercoagulable state   [] Anemic Gastrointestinal:  [] Diarrhea   [] Vomiting  [] Gastroesophageal reflux/heartburn   [] Difficulty swallowing. Genitourinary:  [x] Chronic kidney disease   [] Difficult urination  [] Frequent urination   [] Blood in urine Skin:  [] Rashes   [] Ulcers  Psychological:  [] History of anxiety   []  History of major depression.  Physical Examination  Vitals:   04/28/22 0647  BP: (!) 109/50  Pulse: 87  Resp: 18  Temp: 98.4 F (36.9 C)  TempSrc: Temporal  SpO2: 96%  Weight: (!) 157 kg  Height: 5\' 4"  (1.626 m)   Body mass index is 59.41 kg/m. Gen: WD/WN, NAD Head: Platter/AT, No temporalis wasting.  Ear/Nose/Throat: Hearing grossly intact, nares w/o erythema or drainage Eyes: PER, EOMI, sclera nonicteric.  Neck: Supple, no gross masses or lesions.  No JVD.  Pulmonary:  Good air movement, no audible wheezing, no use of accessory muscles.  Cardiac: RRR, precordium non-hyperdynamic. Vascular:   left upper arm fistula good thrill and bruit, arm is quite large Vessel Right Left  Radial Palpable Palpable  Brachial Palpable  Palpable  Gastrointestinal: soft, non-distended. No guarding/no peritoneal signs.  Musculoskeletal: M/S 5/5 throughout.  No deformity.  Neurologic:  CN 2-12 intact. Pain and light touch intact in extremities.  Symmetrical.  Speech is fluent. Motor exam as listed above. Psychiatric: Judgment intact, Mood & affect appropriate for pt's clinical situation. Dermatologic: No rashes or ulcers noted.  No changes consistent with cellulitis.   CBC Lab Results  Component Value Date   WBC 8.4 04/21/2022   HGB 13.3 04/28/2022   HCT 39.0 04/28/2022   MCV 110.5 (H) 04/21/2022   PLT 196 04/21/2022    BMET    Component Value Date/Time   NA 137 04/28/2022 0639   NA 144 02/27/2020 0931   NA 141 03/09/2014 1533   K 3.8 04/28/2022 0639   K 4.2 03/09/2014 1533   CL 97 (L) 04/28/2022 0639   CL 101 03/09/2014 1533   CO2 29 04/21/2022 1109   CO2 30 03/09/2014 1533   GLUCOSE 131 (H) 04/28/2022 0639   GLUCOSE 95 03/09/2014 1533   BUN 28 (H) 04/28/2022 0639   BUN 40 (H) 02/27/2020 0931   BUN 35 (H) 03/09/2014 1533   CREATININE 7.10 (H) 04/28/2022 0639   CREATININE 1.50 (H) 03/09/2014 1533   CALCIUM 8.4 (L) 04/21/2022 1109   CALCIUM 8.8 03/09/2014 1533   GFRNONAA 6 (L) 04/21/2022 1109   GFRNONAA 39 (L) 03/09/2014 1533   GFRNONAA 55 (L) 07/31/2013 1521   GFRAA 35 (L) 02/27/2020 0931   GFRAA 47 (L) 03/09/2014 1533   GFRAA >60 07/31/2013 1521   Estimated Creatinine Clearance: 13 mL/min (A) (by C-G formula based on SCr of 7.1 mg/dL (H)).  COAG Lab Results  Component Value Date   INR 1.1 02/15/2021   INR 1.35 01/12/2018   INR 1.27 11/16/2017    Radiology No results found.   Assessment/Plan 1. ESRD on hemodialysis ([place]) Recommend:   At this time the patient does not have appropriate extremity access for dialysis, although the vein has matured nicely it remains far too deep to access consistently   Patient should have a revision of her brachiocephalic with superficialization of the vein.     The risks, benefits and alternative therapies were reviewed in detail with the patient.  All questions were answered.  The patient agrees to proceed with surgery.    Eliquis will be stopped 2 days before surgery   The patient will follow up with me in the office after the surgery.   2. Essential hypertension Continue antihypertensive medications as already ordered, these medications have been reviewed and there are no changes at this time.   3. Atrial fibrillation, chronic (HCC) Continue antiarrhythmia medications as already ordered, these medications have been reviewed and there are no changes at this time.   Continue anticoagulation as ordered by Cardiology Service.  Note, Eliquis will be stopped 2 days before surgery   4. Diabetes mellitus type 2 in obese [facility]) Continue hypoglycemic medications as already ordered, these medications have been reviewed and there are no changes at this time.   Hgb A1C to be monitored as already arranged by primary service   5. Dyslipidemia Continue statin as ordered and reviewed, no changes at this time     [name], MD  04/28/2022 7:02 AM

## 2022-04-28 NOTE — Anesthesia Procedure Notes (Signed)
Procedure Name: Intubation Date/Time: 04/28/2022 7:46 AM  Performed by: Kelton Pillar, CRNAPre-anesthesia Checklist: Patient identified, Emergency Drugs available, Suction available and Patient being monitored Patient Re-evaluated:Patient Re-evaluated prior to induction Oxygen Delivery Method: Circle system utilized Preoxygenation: Pre-oxygenation with 100% oxygen Induction Type: IV induction Ventilation: Mask ventilation without difficulty Laryngoscope Size: McGraph and 3 Grade View: Grade I Tube type: Oral Tube size: 7.0 mm Number of attempts: 1 Airway Equipment and Method: Stylet and Oral airway Placement Confirmation: ETT inserted through vocal cords under direct vision, positive ETCO2, breath sounds checked- equal and bilateral and CO2 detector Secured at: 21 cm Tube secured with: Tape Dental Injury: Teeth and Oropharynx as per pre-operative assessment

## 2022-04-28 NOTE — Anesthesia Postprocedure Evaluation (Signed)
Anesthesia Post Note  Patient: Nicole Schmidt  Procedure(s) Performed: REVISON OF ARTERIOVENOUS FISTULA (BRACHIAL CEPHALIC) (Left: Arm Upper)  Patient location during evaluation: PACU Anesthesia Type: General Level of consciousness: awake and alert Pain management: pain level controlled Vital Signs Assessment: post-procedure vital signs reviewed and stable Respiratory status: spontaneous breathing, nonlabored ventilation, respiratory function stable and patient connected to nasal cannula oxygen Cardiovascular status: blood pressure returned to baseline and stable Postop Assessment: no apparent nausea or vomiting Anesthetic complications: no   No notable events documented.   Last Vitals:  Vitals:   04/28/22 1045 04/28/22 1104  BP: (!) 94/49 (!) 101/54  Pulse: 84 87  Resp: 17 14  Temp:  (!) 36.3 C  SpO2: 92% 95%    Last Pain:  Vitals:   04/28/22 1104  TempSrc: Temporal  PainSc: 0-No pain                 Arita Miss

## 2022-04-28 NOTE — H&P (View-Only) (Signed)
MRN : [id number]  Nicole Schmidt is a 57 y.o. (04/15/1965) female who presents with chief complaint of check access.  History of Present Illness:  The patient presents to [facility] for revision of their dialysis access.    She is s/p left brachial cephalic fistula creation on 12/09/2021.   Currently she is using her catheter for HD.  Catheter is a little tender.  The patient denies hand pain or other symptoms consistent with steal phenomena.  No significant arm swelling.   The patient denies any complaints from the dialysis center or their nephrologist.   The patient denies redness or swelling at the access site. The patient denies fever or chills at home or while on dialysis.   Duplex ultrasound of the AV access shows a patent access.  The cephalic vein now measures 6 to 7 mm in diameter.  However it is an inch deep therefore not acceptable as access.  Today flow volume today is 903 cc/min     Current Meds  Medication Sig   acetaminophen (TYLENOL) 325 MG tablet Take 2 tablets (650 mg total) by mouth every 6 (six) hours as needed for mild pain (or Fever >/= 101).   atorvastatin (LIPITOR) 40 MG tablet Take 40 mg by mouth daily.   cloNIDine (CATAPRES) 0.2 MG tablet Take 0.2 mg by mouth 2 (two) times daily.   CRANBERRY PO Take 1 capsule by mouth daily.   ergocalciferol (VITAMIN D2) 1.25 MG (50000 UT) capsule Take 1 capsule (50,000 Units total) by mouth once a week.   ezetimibe (ZETIA) 10 MG tablet Take 10 mg by mouth daily with supper.   ferric gluconate 250 mg in sodium chloride 0.9 % 250 mL Inject 250 mg into the vein Every Tuesday,Thursday,and Saturday with dialysis.   folic acid (FOLVITE) 1 MG tablet Take 1 tablet (1 mg total) by mouth daily.   gabapentin (NEURONTIN) 300 MG capsule Take 1 capsule (300 mg total) by mouth at bedtime.   levothyroxine (SYNTHROID) 112 MCG tablet Take 1 tablet (112 mcg total) by mouth daily before breakfast.   melatonin 5 MG TABS Take  2 tablets (10 mg total) by mouth at bedtime as needed (sleep).   metoprolol succinate (TOPROL-XL) 25 MG 24 hr tablet Take 1 tablet (25 mg total) by mouth daily. Take with or immediately following a meal. (Patient taking differently: Take 25 mg by mouth at bedtime. Take with or immediately following a meal.)   midodrine (PROAMATINE) 10 MG tablet Take 1 tablet (10 mg total) by mouth Every Tuesday,Thursday,and Saturday with dialysis.   polyethylene glycol powder (GLYCOLAX/MIRALAX) 17 GM/SCOOP powder Take 1 Container by mouth once.   sevelamer carbonate (RENVELA) 800 MG tablet Take 1 tablet (800 mg total) by mouth 3 (three) times daily with meals.   traMADol (ULTRAM) 50 MG tablet Take 1 tablet (50 mg total) by mouth every 12 (twelve) hours as needed for moderate pain.   VELPHORO 500 MG chewable tablet Chew 500 mg by mouth 3 (three) times daily.    Past Medical History:  Diagnosis Date   (HFpEF) heart failure with preserved ejection fraction ([place])    a. 10/2018 Echo: EF 60-65%, diast dysfxn, RVSP 50.7 mmHg; b. 01/2020 Echo: EF 65-70%, G2DD; c. 01/2021 Echo: EF 60-65%, no rwma, mod LVH. Nl RV fxn, mildly enlarged RV. Mod BAE. Triv MR. d. 05/2021 Echo: EF 55-60;  no RWMAs, mild LVH, mild TR.   Acquired hypothyroidism 02/09/2020   Adenoma of left adrenal gland    Anemia    Aortic atherosclerosis (HCC)    Arthritis    Breast cancer of upper-inner quadrant of right female breast ([place])    Right breast invasive CA and DCIS , 7 mm T1,N0,M0. Er/PR pos, her 2 negative.  Margins 1 mm.   COPD (chronic obstructive pulmonary disease) (HCC)    Diabetes mellitus type 2 in obese ([place]) 02/09/2020   Difficult intubation 09/28/2021   a.) mallampati III-IV; BMI >55 kg/m   Diverticulosis    Dyslipidemia 02/09/2020   ESBL (extended spectrum beta-lactamase) producing bacteria infection 03/20/2021   a. urine culture (+) for ESBL producing E.coli and P. mirabilis   ESRD (end stage renal disease) on dialysis ([place])    a.  T-Th-Sat HD initiated 02/2021   Essential hypertension    Gout    History of kidney stones    Long term current use of anticoagulant    a.) apixaban   Menopause    age 57   Morbid obesity ([place])    OSA treated with BiPAP    a. on nocturnal PAP therapy; Trilogy 100 ventilator   Permanent atrial fibrillation ([place])    a. Dx 10/2018 in setting of PNA. b. CHA2DS2-VASc = 7 (sex, HFpEF, HTN, PE x2, aortic plaque, T2DM); c. s/p successful DCCV 11/2018. d. subsequent recurrent Afib; rate/rhythm maintained on oral metoprolol succinate; chronically anticoagulated using standard dose apixaban.   Personal history of radiation therapy    Pulmonary embolism ([place]) 10/2014   a. on chronic apixaban   Restrictive lung disease    Shingles 10/24/2021   Sleep difficulties    a.) takes melatonin   Thyroid goiter     Past Surgical History:  Procedure Laterality Date   A/V FISTULAGRAM Left 09/02/2021   Procedure: A/V Fistulagram;  Surgeon: Katha Cabal, MD;  Location: [facility];  Service: Cardiovascular;  Laterality: Left;   AV FISTULA PLACEMENT Left 07/22/2021   Procedure: ARTERIOVENOUS (AV) FISTULA CREATION (RADIALCEPHALIC);  Surgeon: Katha Cabal, MD;  Location: [facility];  Service: Vascular;  Laterality: Left;   AV FISTULA PLACEMENT Left 12/09/2021   Procedure: ARTERIOVENOUS (AV) FISTULA CREATION (BRACHIAL CEPHALIC);  Surgeon: Katha Cabal, MD;  Location: [facility];  Service: Vascular;  Laterality: Left;   BREAST BIOPSY Right 2014   breast ca   BREAST EXCISIONAL BIOPSY Right 07/19/2012   breast ca   BREAST SURGERY Right 2014   with sentinel node bx subareaolar duct excision   CARDIOVERSION N/A 11/28/2018   Procedure: CARDIOVERSION;  Surgeon: Minna Merritts, MD;  Location: [facility];  Service: Cardiovascular;  Laterality: N/A;   COLONOSCOPY WITH PROPOFOL N/A 01/30/2017   Procedure: COLONOSCOPY WITH PROPOFOL;  Surgeon: Christene Lye, MD;  Location: [facility];   Service: Endoscopy;  Laterality: N/A;   CYSTOSCOPY W/ RETROGRADES  06/17/2021   Procedure: CYSTOSCOPY WITH RETROGRADE PYELOGRAM;  Surgeon: Billey Co, MD;  Location: [facility];  Service: Urology;;   CYSTOSCOPY W/ URETERAL STENT PLACEMENT Left 02/14/2021   Procedure: CYSTOSCOPY WITH RETROGRADE PYELOGRAM/URETERAL STENT PLACEMENT;  Surgeon: Billey Co, MD;  Location: [facility];  Service: Urology;  Laterality: Left;   CYSTOSCOPY/URETEROSCOPY/HOLMIUM LASER/STENT PLACEMENT Left 06/17/2021   Procedure: CYSTOSCOPY/URETEROSCOPY/HOLMIUM LASER/STENT EXCHANGE;  Surgeon: Billey Co, MD;  Location: [facility];  Service: Urology;  Laterality: Left;   DIALYSIS/PERMA CATHETER INSERTION N/A 02/24/2021   Procedure: DIALYSIS/PERMA CATHETER INSERTION;  Surgeon: Algernon Huxley, MD;  Location: [place] [facility];  Service: Cardiovascular;  Laterality: N/A;   DIALYSIS/PERMA CATHETER INSERTION N/A 12/16/2021   Procedure: DIALYSIS/PERMA CATHETER INSERTION;  Surgeon: Katha Cabal, MD;  Location: [place] [facility];  Service: Cardiovascular;  Laterality: N/A;   PERIPHERAL VASCULAR THROMBECTOMY Left 09/28/2021   Procedure: PERIPHERAL VASCULAR THROMBECTOMY;  Surgeon: Katha Cabal, MD;  Location: [facility];  Service: Cardiovascular;  Laterality: Left;    Social History Social History   Tobacco Use   Smoking status: Never    Passive exposure: Never   Smokeless tobacco: Never  Vaping Use   Vaping Use: Never used  Substance Use Topics   Alcohol use: No    Alcohol/week: 0.0 standard drinks of alcohol   Drug use: No    Family History Family History  Problem Relation Age of Onset   Diabetes Father    Hypertension Father    Parkinson's disease Father    Hypertension Mother    Rheum arthritis Mother    Atrial fibrillation Mother    Heart failure Mother    Heart attack Mother        died in her mid-70's.   Colon cancer Cousin 54   Breast cancer Neg Hx     Allergies  Allergen  Reactions   Spironolactone     Hyperkalemia  Pt unaware of this allergy     REVIEW OF SYSTEMS (Negative unless checked)  Constitutional: [] Weight loss  [] Fever  [] Chills Cardiac: [] Chest pain   [] Chest pressure   [] Palpitations   [] Shortness of breath when laying flat   [] Shortness of breath with exertion. Vascular:  [] Pain in legs with walking   [] Pain in legs at rest  [] History of DVT   [] Phlebitis   [] Swelling in legs   [] Varicose veins   [] Non-healing ulcers Pulmonary:   [] Uses home oxygen   [] Productive cough   [] Hemoptysis   [] Wheeze  [x] COPD   [] Asthma Neurologic:  [] Dizziness   [] Seizures   [] History of stroke   [] History of TIA  [] Aphasia   [] Vissual changes   [] Weakness or numbness in arm   [] Weakness or numbness in leg Musculoskeletal:   [] Joint swelling   [] Joint pain   [] Low back pain Hematologic:  [] Easy bruising  [] Easy bleeding   [x] Hypercoagulable state   [] Anemic Gastrointestinal:  [] Diarrhea   [] Vomiting  [] Gastroesophageal reflux/heartburn   [] Difficulty swallowing. Genitourinary:  [x] Chronic kidney disease   [] Difficult urination  [] Frequent urination   [] Blood in urine Skin:  [] Rashes   [] Ulcers  Psychological:  [] History of anxiety   []  History of major depression.  Physical Examination  Vitals:   04/28/22 0647  BP: (!) 109/50  Pulse: 87  Resp: 18  Temp: 98.4 F (36.9 C)  TempSrc: Temporal  SpO2: 96%  Weight: (!) 157 kg  Height: 5\' 4"  (1.626 m)   Body mass index is 59.41 kg/m. Gen: WD/WN, NAD Head: [place]/AT, No temporalis wasting.  Ear/Nose/Throat: Hearing grossly intact, nares w/o erythema or drainage Eyes: PER, EOMI, sclera nonicteric.  Neck: Supple, no gross masses or lesions.  No JVD.  Pulmonary:  Good air movement, no audible wheezing, no use of accessory muscles.  Cardiac: RRR, precordium non-hyperdynamic. Vascular:   left upper arm fistula good thrill and bruit, arm is quite large Vessel Right Left  Radial Palpable Palpable  Brachial Palpable  Palpable  Gastrointestinal: soft, non-distended. No guarding/no peritoneal signs.  Musculoskeletal: M/S 5/5 throughout.  No deformity.  Neurologic:  CN 2-12 intact. Pain and light touch intact in extremities.  Symmetrical.  Speech is fluent. Motor exam as listed above. Psychiatric: Judgment intact, Mood & affect appropriate for pt's clinical situation. Dermatologic: No rashes or ulcers noted.  No changes consistent with cellulitis.   CBC Lab Results  Component Value Date   WBC 8.4 04/21/2022   HGB 13.3 04/28/2022   HCT 39.0 04/28/2022   MCV 110.5 (H) 04/21/2022   PLT 196 04/21/2022    BMET    Component Value Date/Time   NA 137 04/28/2022 0639   NA 144 02/27/2020 0931   NA 141 03/09/2014 1533   K 3.8 04/28/2022 0639   K 4.2 03/09/2014 1533   CL 97 (L) 04/28/2022 0639   CL 101 03/09/2014 1533   CO2 29 04/21/2022 1109   CO2 30 03/09/2014 1533   GLUCOSE 131 (H) 04/28/2022 0639   GLUCOSE 95 03/09/2014 1533   BUN 28 (H) 04/28/2022 0639   BUN 40 (H) 02/27/2020 0931   BUN 35 (H) 03/09/2014 1533   CREATININE 7.10 (H) 04/28/2022 0639   CREATININE 1.50 (H) 03/09/2014 1533   CALCIUM 8.4 (L) 04/21/2022 1109   CALCIUM 8.8 03/09/2014 1533   GFRNONAA 6 (L) 04/21/2022 1109   GFRNONAA 39 (L) 03/09/2014 1533   GFRNONAA 55 (L) 07/31/2013 1521   GFRAA 35 (L) 02/27/2020 0931   GFRAA 47 (L) 03/09/2014 1533   GFRAA >60 07/31/2013 1521   Estimated Creatinine Clearance: 13 mL/min (A) (by C-G formula based on SCr of 7.1 mg/dL (H)).  COAG Lab Results  Component Value Date   INR 1.1 02/15/2021   INR 1.35 01/12/2018   INR 1.27 11/16/2017    Radiology No results found.   Assessment/Plan 1. ESRD on hemodialysis ([place]) Recommend:   At this time the patient does not have appropriate extremity access for dialysis, although the vein has matured nicely it remains far too deep to access consistently   Patient should have a revision of her brachiocephalic with superficialization of the vein.     The risks, benefits and alternative therapies were reviewed in detail with the patient.  All questions were answered.  The patient agrees to proceed with surgery.    Eliquis will be stopped 2 days before surgery   The patient will follow up with me in the office after the surgery.   2. Essential hypertension Continue antihypertensive medications as already ordered, these medications have been reviewed and there are no changes at this time.   3. Atrial fibrillation, chronic (HCC) Continue antiarrhythmia medications as already ordered, these medications have been reviewed and there are no changes at this time.   Continue anticoagulation as ordered by Cardiology Service.  Note, Eliquis will be stopped 2 days before surgery   4. Diabetes mellitus type 2 in obese [facility]) Continue hypoglycemic medications as already ordered, these medications have been reviewed and there are no changes at this time.   Hgb A1C to be monitored as already arranged by primary service   5. Dyslipidemia Continue statin as ordered and reviewed, no changes at this time     [name], MD  04/28/2022 7:02 AM

## 2022-04-28 NOTE — Op Note (Signed)
     OPERATIVE NOTE   PROCEDURE: left brachial cephalic arteriovenous fistula revision  PRE-OPERATIVE DIAGNOSIS: End Stage Renal Disease; nonmaturing AV fistula  POST-OPERATIVE DIAGNOSIS: Same  SURGEON: [name]  ASSISTANT(S): None  ANESTHESIA: general  ESTIMATED BLOOD LOSS: <50 cc  FINDING(S): 6 to 8 mm cephalic vein  SPECIMEN(S):  none  INDICATIONS:   Nicole Schmidt is a 57 y.o. female who presents with end stage renal disease.  The patient is scheduled for left brachiocephalic arteriovenous fistula revision.  The patient is aware the risks include but are not limited to: bleeding, infection, steal syndrome, nerve damage, ischemic monomelic neuropathy, failure to mature, and need for additional procedures.  The patient is aware of the risks of the procedure and elects to proceed forward.  DESCRIPTION: After full informed written consent was obtained from the patient, the patient was brought back to the operating room and placed supine upon the operating table.  Prior to induction, the patient received IV antibiotics.   After obtaining adequate anesthesia, the patient was then prepped and draped in the standard fashion for a left arm access procedure.    A linear incision was then created overlying the existing fistula at the level of the arterial anastomosis. The cephalic vein was then identified and dissected circumferentially.  In a serial fashion the incision is then extended toward the shoulder exposing the vein as I go and dissecting it circumferentially.  Side branches are ligated between 2-0 or 3-0 0 silk ties as encountered.  Once approximately 15 to 20 cm of the vein has been exposed it is inspected.  It is found to be 6 to 8 mm in diameter the entire length.  The wound was then irrigated with 500 cc of sterile saline and hemostasis achieved with Bovie cautery or 3-0 Vicryl suture ligatures.  Marcaine with Exparel was then infiltrated in the soft tissues and the  skin edge.  The subcutaneous tissues were then reapproximated with a running 3-0 Vicryl suture underneath the vein.  The vein is then laid in the bed of this tissue.  Interrupted deep dermal 4-0 Monocryl sutures were then used to reapproximate the skin over the vein.  The skin is then reapproximated using a running 4-0 Monocryl subcuticular suture.  The arm is cleansed and then Dermabond is applied.  The patient tolerated this procedure well.   COMPLICATIONS: None  CONDITION: [facility]  Office: [phone]   04/28/2022, 10:25 AM

## 2022-04-28 NOTE — Transfer of Care (Signed)
Immediate Anesthesia Transfer of Care Note  Patient: Nicole Schmidt  Procedure(s) Performed: REVISON OF ARTERIOVENOUS FISTULA (BRACHIAL CEPHALIC) (Left: Arm Upper)  Patient Location: PACU  Anesthesia Type:General  Level of Consciousness: awake, drowsy, and patient cooperative  Airway & Oxygen Therapy: Patient Spontanous Breathing and Patient connected to face mask oxygen  Post-op Assessment: Report given to RN and Post -op Vital signs reviewed and stable  Post vital signs: Reviewed and stable  Last Vitals:  Vitals Value Taken Time  BP 105/33 04/28/22 0945  Temp    Pulse 91 04/28/22 0951  Resp 25 04/28/22 0951  SpO2 100 % 04/28/22 0951  Vitals shown include unvalidated device data.  Last Pain:  Vitals:   04/28/22 0647  TempSrc: Temporal  PainSc: 0-No pain         Complications: No notable events documented.

## 2022-04-28 NOTE — Interval H&P Note (Signed)
History and Physical Interval Note:  04/28/2022 7:34 AM  Nicole Schmidt  has presented today for surgery, with the diagnosis of ESRD.  The various methods of treatment have been discussed with the patient and family. After consideration of risks, benefits and other options for treatment, the patient has consented to  Procedure(s): REVISON OF ARTERIOVENOUS FISTULA (BRACHIAL CEPHALIC) (Left) as a surgical intervention.  The patient's history has been reviewed, patient examined, no change in status, stable for surgery.  I have reviewed the patient's chart and labs.  Questions were answered to the patient's satisfaction.     [name]

## 2022-04-29 ENCOUNTER — Encounter: Payer: Self-pay | Admitting: Vascular Surgery

## 2022-05-10 ENCOUNTER — Encounter (INDEPENDENT_AMBULATORY_CARE_PROVIDER_SITE_OTHER): Payer: Self-pay | Admitting: Nurse Practitioner

## 2022-05-10 ENCOUNTER — Ambulatory Visit (INDEPENDENT_AMBULATORY_CARE_PROVIDER_SITE_OTHER): Payer: BC Managed Care – PPO | Admitting: Nurse Practitioner

## 2022-05-10 VITALS — BP 93/58 | HR 90 | Resp 16 | Wt 347.8 lb

## 2022-05-10 DIAGNOSIS — N186 End stage renal disease: Secondary | ICD-10-CM

## 2022-05-11 ENCOUNTER — Ambulatory Visit (INDEPENDENT_AMBULATORY_CARE_PROVIDER_SITE_OTHER): Payer: BC Managed Care – PPO | Admitting: Nurse Practitioner

## 2022-05-14 DIAGNOSIS — N186 End stage renal disease: Secondary | ICD-10-CM | POA: Diagnosis not present

## 2022-05-14 DIAGNOSIS — Z992 Dependence on renal dialysis: Secondary | ICD-10-CM | POA: Diagnosis not present

## 2022-05-20 DIAGNOSIS — E662 Morbid (severe) obesity with alveolar hypoventilation: Secondary | ICD-10-CM | POA: Diagnosis not present

## 2022-05-24 DIAGNOSIS — E063 Autoimmune thyroiditis: Secondary | ICD-10-CM | POA: Diagnosis not present

## 2022-05-24 DIAGNOSIS — G4733 Obstructive sleep apnea (adult) (pediatric): Secondary | ICD-10-CM | POA: Diagnosis not present

## 2022-05-24 DIAGNOSIS — I5032 Chronic diastolic (congestive) heart failure: Secondary | ICD-10-CM | POA: Diagnosis not present

## 2022-05-24 DIAGNOSIS — I482 Chronic atrial fibrillation, unspecified: Secondary | ICD-10-CM | POA: Diagnosis not present

## 2022-05-24 DIAGNOSIS — E039 Hypothyroidism, unspecified: Secondary | ICD-10-CM | POA: Diagnosis not present

## 2022-05-24 DIAGNOSIS — J9601 Acute respiratory failure with hypoxia: Secondary | ICD-10-CM | POA: Diagnosis not present

## 2022-05-24 DIAGNOSIS — I10 Essential (primary) hypertension: Secondary | ICD-10-CM | POA: Diagnosis not present

## 2022-05-24 DIAGNOSIS — E1165 Type 2 diabetes mellitus with hyperglycemia: Secondary | ICD-10-CM | POA: Diagnosis not present

## 2022-05-24 DIAGNOSIS — C50819 Malignant neoplasm of overlapping sites of unspecified female breast: Secondary | ICD-10-CM | POA: Diagnosis not present

## 2022-05-30 ENCOUNTER — Encounter (INDEPENDENT_AMBULATORY_CARE_PROVIDER_SITE_OTHER): Payer: Self-pay | Admitting: Nurse Practitioner

## 2022-05-30 NOTE — Progress Notes (Signed)
Subjective:    Patient ID: Nicole Schmidt, female    DOB: 07/18/1965, 57 y.o.   MRN: [id number] Chief Complaint  Patient presents with   Follow-up    [facility] 2 week follow up    Nicole Schmidt is a 57 year old female who returns today for follow-up evaluation after recent revision of left brachiocephalic AV fistula.  Today the incision is primarily intact.  There is a small area that looks as if it has some opening but it is largely scabbed over at this time.  No signs symptoms of steal syndrome currently.  No unexpected swelling.  Overall patient is progressing as expected.    Review of Systems  Skin:  Positive for wound.  All other systems reviewed and are negative.      Objective:   Physical Exam Vitals reviewed.  HENT:     Head: Normocephalic.  Cardiovascular:     Rate and Rhythm: Normal rate.     Pulses:          Radial pulses are 1+ on the left side.  Pulmonary:     Effort: Pulmonary effort is normal.  Skin:    General: Skin is warm and dry.  Neurological:     Mental Status: She is alert and oriented to person, place, and time.  Psychiatric:        Mood and Affect: Mood normal.        Behavior: Behavior normal.        Thought Content: Thought content normal.        Judgment: Judgment normal.     BP (!) 93/58 (BP Location: Right Arm)   Pulse 90   Resp 16   Wt (!) 347 lb 12.8 oz (157.8 kg)   LMP 07/17/2012   BMI 59.70 kg/m   Past Medical History:  Diagnosis Date   (HFpEF) heart failure with preserved ejection fraction    a. 10/2018 Echo: EF 60-65%, diast dysfxn, RVSP 50.7 mmHg; b. 01/2020 Echo: EF 65-70%, G2DD; c. 01/2021 Echo: EF 60-65%, no rwma, mod LVH. Nl RV fxn, mildly enlarged RV. Mod BAE. Triv MR. d. 05/2021 Echo: EF 55-60; no RWMAs, mild LVH, mild TR.   Acquired hypothyroidism 02/09/2020   Adenoma of left adrenal gland    Anemia    Aortic atherosclerosis    Arthritis    Breast cancer of upper-inner quadrant of right female breast    Right breast  invasive CA and DCIS , 7 mm T1,N0,M0. Er/PR pos, her 2 negative.  Margins 1 mm.   COPD (chronic obstructive pulmonary disease)    Diabetes mellitus type 2 in obese 02/09/2020   Difficult intubation 09/28/2021   a.) mallampati III-IV; BMI >55 kg/m   Diverticulosis    Dyslipidemia 02/09/2020   ESBL (extended spectrum beta-lactamase) producing bacteria infection 03/20/2021   a. urine culture (+) for ESBL producing E.coli and P. mirabilis   ESRD (end stage renal disease) on dialysis    a. T-Th-Sat HD initiated 02/2021   Essential hypertension    Gout    History of kidney stones    Long term current use of anticoagulant    a.) apixaban   Menopause    age 66   Morbid obesity    OSA treated with BiPAP    a. on nocturnal PAP therapy; Trilogy 100 ventilator   Permanent atrial fibrillation    a. Dx 10/2018 in setting of PNA. b. CHA2DS2-VASc = 7 (sex, HFpEF, HTN, PE x2, aortic plaque, T2DM); c. s/p  successful DCCV 11/2018. d. subsequent recurrent Afib; rate/rhythm maintained on oral metoprolol succinate; chronically anticoagulated using standard dose apixaban.   Personal history of radiation therapy    Pulmonary embolism 10/2014   a. on chronic apixaban   Restrictive lung disease    Shingles 10/24/2021   Sleep difficulties    a.) takes melatonin   Thyroid goiter     Social History   Socioeconomic History   Marital status: Single    Spouse name: Not on file   Number of children: Not on file   Years of education: Not on file   Highest education level: Not on file  Occupational History   Occupation: retired  Tobacco Use   Smoking status: Never    Passive exposure: Never   Smokeless tobacco: Never  Vaping Use   Vaping Use: Never used  Substance and Sexual Activity   Alcohol use: No    Alcohol/week: 0.0 standard drinks of alcohol   Drug use: No   Sexual activity: Not on file  Other Topics Concern   Not on file  Social History Narrative   Lives in [place] by herself and  retired.    Social Determinants of Health   Financial Resource Strain: Unknown (11/15/2017)   Overall Financial Resource Strain (CARDIA)    Difficulty of Paying Living Expenses: Patient declined  Food Insecurity: Unknown (11/15/2017)   Hunger Vital Sign    Worried About Running Out of Food in the Last Year: Patient declined    Ran Out of Food in the Last Year: Patient declined  Transportation Needs: Unknown (11/15/2017)   PRAPARE - Administrator, Civil Service (Medical): Patient declined    Lack of Transportation (Non-Medical): Patient declined  Physical Activity: Unknown (11/15/2017)   Exercise Vital Sign    Days of Exercise per Week: Patient declined    Minutes of Exercise per Session: Patient declined  Stress: Unknown (11/15/2017)   [employer] of Occupational Health - Occupational Stress Questionnaire    Feeling of Stress : Patient declined  Social Connections: Unknown (11/15/2017)   Social Connection and Isolation Panel [NHANES]    Frequency of Communication with Friends and Family: Patient declined    Frequency of Social Gatherings with Friends and Family: Patient declined    Attends Religious Services: Patient declined    Active Member of Clubs or Organizations: Patient declined    Attends Banker Meetings: Patient declined    Marital Status: Patient declined  Intimate Partner Violence: Unknown (11/15/2017)   Humiliation, Afraid, Rape, and Kick questionnaire    Fear of Current or Ex-Partner: Patient declined    Emotionally Abused: Patient declined    Physically Abused: Patient declined    Sexually Abused: Patient declined    Past Surgical History:  Procedure Laterality Date   A/V FISTULAGRAM Left 09/02/2021   Procedure: A/V Fistulagram;  Surgeon: Gilda Crease Latina Craver, MD;  Location: [facility];  Service: Cardiovascular;  Laterality: Left;   AV FISTULA PLACEMENT Left 07/22/2021   Procedure: ARTERIOVENOUS (AV) FISTULA CREATION  (RADIALCEPHALIC);  Surgeon: Renford Dills, MD;  Location: [facility];  Service: Vascular;  Laterality: Left;   AV FISTULA PLACEMENT Left 12/09/2021   Procedure: ARTERIOVENOUS (AV) FISTULA CREATION (BRACHIAL CEPHALIC);  Surgeon: Renford Dills, MD;  Location: [facility];  Service: Vascular;  Laterality: Left;   BREAST BIOPSY Right 2014   breast ca   BREAST EXCISIONAL BIOPSY Right 07/19/2012   breast ca   BREAST SURGERY Right 2014  with sentinel node bx subareaolar duct excision   CARDIOVERSION N/A 11/28/2018   Procedure: CARDIOVERSION;  Surgeon: Antonieta Iba, MD;  Location: [facility];  Service: Cardiovascular;  Laterality: N/A;   COLONOSCOPY WITH PROPOFOL N/A 01/30/2017   Procedure: COLONOSCOPY WITH PROPOFOL;  Surgeon: Kieth Brightly, MD;  Location: [facility];  Service: Endoscopy;  Laterality: N/A;   CYSTOSCOPY W/ RETROGRADES  06/17/2021   Procedure: CYSTOSCOPY WITH RETROGRADE PYELOGRAM;  Surgeon: Sondra Come, MD;  Location: [facility];  Service: Urology;;   CYSTOSCOPY W/ URETERAL STENT PLACEMENT Left 02/14/2021   Procedure: CYSTOSCOPY WITH RETROGRADE PYELOGRAM/URETERAL STENT PLACEMENT;  Surgeon: Sondra Come, MD;  Location: [facility];  Service: Urology;  Laterality: Left;   CYSTOSCOPY/URETEROSCOPY/HOLMIUM LASER/STENT PLACEMENT Left 06/17/2021   Procedure: CYSTOSCOPY/URETEROSCOPY/HOLMIUM LASER/STENT EXCHANGE;  Surgeon: Sondra Come, MD;  Location: [facility];  Service: Urology;  Laterality: Left;   DIALYSIS/PERMA CATHETER INSERTION N/A 02/24/2021   Procedure: DIALYSIS/PERMA CATHETER INSERTION;  Surgeon: Annice Needy, MD;  Location: [facility];  Service: Cardiovascular;  Laterality: N/A;   DIALYSIS/PERMA CATHETER INSERTION N/A 12/16/2021   Procedure: DIALYSIS/PERMA CATHETER INSERTION;  Surgeon: Renford Dills, MD;  Location: [facility];  Service: Cardiovascular;  Laterality: N/A;   PERIPHERAL VASCULAR THROMBECTOMY Left 09/28/2021   Procedure:  PERIPHERAL VASCULAR THROMBECTOMY;  Surgeon: Renford Dills, MD;  Location: [facility];  Service: Cardiovascular;  Laterality: Left;   REVISON OF ARTERIOVENOUS FISTULA Left 04/28/2022   Procedure: REVISON OF ARTERIOVENOUS FISTULA (BRACHIAL CEPHALIC);  Surgeon: Renford Dills, MD;  Location: [facility];  Service: Vascular;  Laterality: Left;    Family History  Problem Relation Age of Onset   Diabetes Father    Hypertension Father    Parkinson's disease Father    Hypertension Mother    Rheum arthritis Mother    Atrial fibrillation Mother    Heart failure Mother    Heart attack Mother        died in her mid-70's.   Colon cancer Cousin 54   Breast cancer Neg Hx     Allergies  Allergen Reactions   Spironolactone     Hyperkalemia  Pt unaware of this allergy       Latest Ref Rng & Units 04/28/2022    6:39 AM 04/21/2022   11:09 AM 12/16/2021    8:30 AM  CBC  WBC 4.0 - 10.5 K/uL  8.4  9.1   Hemoglobin 12.0 - 15.0 g/dL 16.1  09.6  04.5   Hematocrit 36.0 - 46.0 % 39.0  40.0  34.7   Platelets 150 - 400 K/uL  196  227       CMP     Component Value Date/Time   NA 137 04/28/2022 0639   NA 144 02/27/2020 0931   NA 141 03/09/2014 1533   K 3.8 04/28/2022 0639   K 4.2 03/09/2014 1533   CL 97 (L) 04/28/2022 0639   CL 101 03/09/2014 1533   CO2 29 04/21/2022 1109   CO2 30 03/09/2014 1533   GLUCOSE 131 (H) 04/28/2022 0639   GLUCOSE 95 03/09/2014 1533   BUN 28 (H) 04/28/2022 0639   BUN 40 (H) 02/27/2020 0931   BUN 35 (H) 03/09/2014 1533   CREATININE 7.10 (H) 04/28/2022 0639   CREATININE 1.50 (H) 03/09/2014 1533   CALCIUM 8.4 (L) 04/21/2022 1109   CALCIUM 8.8 03/09/2014 1533   PROT 7.5 07/26/2021 2222   PROT 7.3 03/09/2014 1533   ALBUMIN 3.6 07/26/2021  2222   ALBUMIN 3.5 03/09/2014 1533   AST 17 07/26/2021 2222   AST 16 03/09/2014 1533   ALT 6 07/26/2021 2222   ALT 23 03/09/2014 1533   ALKPHOS 162 (H) 07/26/2021 2222   ALKPHOS 113 03/09/2014 1533   BILITOT  0.8 07/26/2021 2222   BILITOT 0.3 03/09/2014 1533   GFRNONAA 6 (L) 04/21/2022 1109   GFRNONAA 39 (L) 03/09/2014 1533   GFRNONAA 55 (L) 07/31/2013 1521   GFRAA 35 (L) 02/27/2020 0931   GFRAA 47 (L) 03/09/2014 1533   GFRAA >60 07/31/2013 1521     No results found.     Assessment & Plan:   1. ESRD (end stage renal disease) (HCC) Wound is doing well today there is just a very small area that is scabbed over with an area of possible dehiscence.  At this time we will continue to dress with Xeroform to be done daily.  Will have the patient return in 3 weeks with an HDA.   Current Outpatient Medications on File Prior to Visit  Medication Sig Dispense Refill   acetaminophen (TYLENOL) 325 MG tablet Take 2 tablets (650 mg total) by mouth every 6 (six) hours as needed for mild pain (or Fever >/= 101).     albuterol (VENTOLIN HFA) 108 (90 Base) MCG/ACT inhaler Inhale 2 puffs into the lungs every 6 (six) hours as needed for wheezing or shortness of breath. 8.5 g 0   apixaban (ELIQUIS) 5 MG TABS tablet Take 1 tablet (5 mg total) by mouth 2 (two) times daily. 180 tablet 1   atorvastatin (LIPITOR) 40 MG tablet Take 40 mg by mouth daily.     cloNIDine (CATAPRES) 0.2 MG tablet Take 0.2 mg by mouth 2 (two) times daily.     CRANBERRY PO Take 1 capsule by mouth daily.     ergocalciferol (VITAMIN D2) 1.25 MG (50000 UT) capsule Take 1 capsule (50,000 Units total) by mouth once a week. 5 capsule 0   ezetimibe (ZETIA) 10 MG tablet Take 10 mg by mouth daily with supper.     ferric gluconate 250 mg in sodium chloride 0.9 % 250 mL Inject 250 mg into the vein Every Tuesday,Thursday,and Saturday with dialysis.     folic acid (FOLVITE) 1 MG tablet Take 1 tablet (1 mg total) by mouth daily. 30 tablet 0   gabapentin (NEURONTIN) 300 MG capsule Take 1 capsule (300 mg total) by mouth at bedtime. 30 capsule 0   HYDROcodone-acetaminophen (NORCO) 5-325 MG tablet Take 1 tablet by mouth every 6 (six) hours as needed for  moderate pain. 30 tablet 0   Lancets (ONETOUCH DELICA PLUS LANCET30G) MISC      levothyroxine (SYNTHROID) 112 MCG tablet Take 1 tablet (112 mcg total) by mouth daily before breakfast. 30 tablet 0   melatonin 5 MG TABS Take 2 tablets (10 mg total) by mouth at bedtime as needed (sleep). 60 tablet 0   metoprolol succinate (TOPROL-XL) 25 MG 24 hr tablet Take 1 tablet (25 mg total) by mouth daily. Take with or immediately following a meal. (Patient taking differently: Take 25 mg by mouth at bedtime. Take with or immediately following a meal.) 90 tablet 3   midodrine (PROAMATINE) 10 MG tablet Take 1 tablet (10 mg total) by mouth Every Tuesday,Thursday,and Saturday with dialysis. 30 tablet 0   ONETOUCH ULTRA test strip      polyethylene glycol powder (GLYCOLAX/MIRALAX) 17 GM/SCOOP powder Take 1 Container by mouth once.     sevelamer carbonate (RENVELA)  800 MG tablet Take 1 tablet (800 mg total) by mouth 3 (three) times daily with meals. 90 tablet 0   traMADol (ULTRAM) 50 MG tablet Take 1 tablet (50 mg total) by mouth every 12 (twelve) hours as needed for moderate pain. 30 tablet 0   TRULICITY 1.5 MG/0.5ML SOPN Inject 1.5 mg into the skin every Monday.     VELPHORO 500 MG chewable tablet Chew 500 mg by mouth 3 (three) times daily.     No current facility-administered medications on file prior to visit.    There are no Patient Instructions on file for this visit. No follow-ups on file.   Georgiana Spinner, NP

## 2022-05-31 DIAGNOSIS — E1165 Type 2 diabetes mellitus with hyperglycemia: Secondary | ICD-10-CM | POA: Diagnosis not present

## 2022-05-31 DIAGNOSIS — E669 Obesity, unspecified: Secondary | ICD-10-CM | POA: Diagnosis not present

## 2022-05-31 DIAGNOSIS — I2699 Other pulmonary embolism without acute cor pulmonale: Secondary | ICD-10-CM | POA: Diagnosis not present

## 2022-05-31 DIAGNOSIS — I5043 Acute on chronic combined systolic (congestive) and diastolic (congestive) heart failure: Secondary | ICD-10-CM | POA: Diagnosis not present

## 2022-05-31 DIAGNOSIS — E119 Type 2 diabetes mellitus without complications: Secondary | ICD-10-CM | POA: Diagnosis not present

## 2022-05-31 DIAGNOSIS — Z6841 Body Mass Index (BMI) 40.0 and over, adult: Secondary | ICD-10-CM | POA: Diagnosis not present

## 2022-05-31 DIAGNOSIS — N185 Chronic kidney disease, stage 5: Secondary | ICD-10-CM | POA: Diagnosis not present

## 2022-05-31 DIAGNOSIS — J9610 Chronic respiratory failure, unspecified whether with hypoxia or hypercapnia: Secondary | ICD-10-CM | POA: Diagnosis not present

## 2022-05-31 DIAGNOSIS — I480 Paroxysmal atrial fibrillation: Secondary | ICD-10-CM | POA: Diagnosis not present

## 2022-06-06 ENCOUNTER — Other Ambulatory Visit (INDEPENDENT_AMBULATORY_CARE_PROVIDER_SITE_OTHER): Payer: Self-pay | Admitting: Vascular Surgery

## 2022-06-06 DIAGNOSIS — N186 End stage renal disease: Secondary | ICD-10-CM

## 2022-06-06 DIAGNOSIS — Z9889 Other specified postprocedural states: Secondary | ICD-10-CM

## 2022-06-07 ENCOUNTER — Encounter (INDEPENDENT_AMBULATORY_CARE_PROVIDER_SITE_OTHER): Payer: Self-pay | Admitting: Nurse Practitioner

## 2022-06-07 ENCOUNTER — Ambulatory Visit (INDEPENDENT_AMBULATORY_CARE_PROVIDER_SITE_OTHER): Payer: BC Managed Care – PPO | Admitting: Nurse Practitioner

## 2022-06-07 ENCOUNTER — Ambulatory Visit (INDEPENDENT_AMBULATORY_CARE_PROVIDER_SITE_OTHER): Payer: BC Managed Care – PPO

## 2022-06-07 VITALS — BP 120/67 | HR 89 | Resp 18 | Ht 64.0 in | Wt 345.0 lb

## 2022-06-07 DIAGNOSIS — E785 Hyperlipidemia, unspecified: Secondary | ICD-10-CM

## 2022-06-07 DIAGNOSIS — N186 End stage renal disease: Secondary | ICD-10-CM

## 2022-06-07 DIAGNOSIS — Z9889 Other specified postprocedural states: Secondary | ICD-10-CM

## 2022-06-07 DIAGNOSIS — I10 Essential (primary) hypertension: Secondary | ICD-10-CM

## 2022-06-19 DIAGNOSIS — E662 Morbid (severe) obesity with alveolar hypoventilation: Secondary | ICD-10-CM | POA: Diagnosis not present

## 2022-06-20 ENCOUNTER — Encounter (INDEPENDENT_AMBULATORY_CARE_PROVIDER_SITE_OTHER): Payer: Self-pay | Admitting: Nurse Practitioner

## 2022-06-20 NOTE — Progress Notes (Signed)
Subjective:    Patient ID: Nicole Schmidt, female    DOB: 1965/03/09, 57 y.o.   MRN: [id number] Chief Complaint  Patient presents with   Follow-up    f/u in 3 weeks with HDA    The patient returns to the office for follow up regarding a problem with their dialysis access.   The patient notes a significant increase in bleeding time after decannulation.  The patient has also been informed that there is increased recirculation.    The patient denies hand pain or other symptoms consistent with steal phenomena.  No significant arm swelling.  The patient denies redness or swelling at the access site. The patient denies fever or chills at home or while on dialysis.  No recent shortening of the patient's walking distance or new symptoms consistent with claudication.  No history of rest pain symptoms. No new ulcers or wounds of the lower extremities have occurred.  The patient denies amaurosis fugax or recent TIA symptoms. There are no recent neurological changes noted. There is no history of DVT, PE or superficial thrombophlebitis. No recent episodes of angina or shortness of breath documented.   Duplex ultrasound of the AV access shows a patent access.  The previously noted stenosis is significantly increased compared to last study.  Flow volume today is 1365 cc/min (previous flow volume was 903 cc/min) there is a noted level of significant stenosis at the shoulder area    Review of Systems  All other systems reviewed and are negative.      Objective:   Physical Exam Vitals reviewed.  HENT:     Head: Normocephalic.  Cardiovascular:     Rate and Rhythm: Normal rate.     Pulses:          Radial pulses are 2+ on the left side.     Arteriovenous access: Left arteriovenous access is present.    Comments: [place] Pulmonary:     Effort: Pulmonary effort is normal.  Skin:    General: Skin is warm and dry.  Neurological:     Mental Status: She is alert and oriented to person,  place, and time.  Psychiatric:        Mood and Affect: Mood normal.        Behavior: Behavior normal.        Thought Content: Thought content normal.        Judgment: Judgment normal.     BP 120/67 (BP Location: Right Wrist)   Pulse 89   Resp 18   Ht 5\' 4"  (1.626 m)   Wt (!) 345 lb (156.5 kg)   LMP 07/17/2012   BMI 59.22 kg/m   Past Medical History:  Diagnosis Date   (HFpEF) heart failure with preserved ejection fraction (HCC)    a. 10/2018 Echo: EF 60-65%, diast dysfxn, RVSP 50.7 mmHg; b. 01/2020 Echo: EF 65-70%, G2DD; c. 01/2021 Echo: EF 60-65%, no rwma, mod LVH. Nl RV fxn, mildly enlarged RV. Mod BAE. Triv MR. d. 05/2021 Echo: EF 55-60; no RWMAs, mild LVH, mild TR.   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 Er/PR pos, her 2 negative.  Margins 1 mm.   COPD (chronic obstructive pulmonary disease) (HCC)    Diabetes mellitus type  2 in obese 02/09/2020   Difficult intubation 09/28/2021   a.) mallampati III-IV; BMI >55 kg/m   Diverticulosis    Dyslipidemia 02/09/2020   ESBL (extended spectrum beta-lactamase) producing bacteria infection 03/20/2021   a. urine culture (+) for ESBL producing E.coli and P. mirabilis   ESRD (end stage renal disease) on dialysis (HCC)    a. T-Th-Sat HD initiated 02/2021   Essential hypertension    Gout    History of kidney stones    Long term current use of anticoagulant    a.) apixaban   Menopause    age 73   Morbid obesity (HCC)    OSA treated with BiPAP    a. on nocturnal PAP therapy; Trilogy 100 ventilator   Permanent atrial fibrillation (HCC)    a. Dx 10/2018 in setting of PNA. b. CHA2DS2-VASc = 7 (sex, HFpEF, HTN, PE x2, aortic plaque, T2DM); c. s/p successful DCCV 11/2018. d. subsequent recurrent Afib; rate/rhythm maintained on oral  metoprolol succinate; chronically anticoagulated using standard dose apixaban.   Personal history of radiation therapy    Pulmonary embolism (HCC) 10/2014   a. on chronic apixaban   Restrictive lung disease    Shingles 10/24/2021   Sleep difficulties    a.) takes melatonin   Thyroid goiter     Social History   Socioeconomic History   Marital status: Single    Spouse name: Not on file   Number of children: Not on file   Years of education: Not on file   Highest education level: Not on file  Occupational History   Occupation: retired  Tobacco Use   Smoking status: Never    Passive exposure: Never   Smokeless tobacco: Never  Vaping Use   Vaping Use: Never used  Substance and Sexual Activity   Alcohol use: No    Alcohol/week: 0.0 standard drinks of alcohol   Drug use: No   Sexual activity: Not on file  Other Topics Concern   Not on file  Social History Narrative   Lives in [place] by herself and retired.    Social Determinants of Health   Financial Resource Strain: Unknown (11/15/2017)   Overall Financial Resource Strain (CARDIA)    Difficulty of Paying Living Expenses: Patient declined  Food Insecurity: Unknown (11/15/2017)   Hunger Vital Sign    Worried About Running Out of Food in the Last Year: Patient declined    Ran Out of Food in the Last Year: Patient declined  Transportation Needs: Unknown (11/15/2017)   PRAPARE - Administrator, Civil Service (Medical): Patient declined    Lack of Transportation (Non-Medical): Patient declined  Physical Activity: Unknown (11/15/2017)   Exercise Vital Sign    Days of Exercise per Week: Patient declined    Minutes of Exercise per Session: Patient declined  Stress: Unknown (11/15/2017)   [employer] of Occupational Health - Occupational Stress Questionnaire    Feeling of Stress : Patient declined  Social Connections: Unknown (11/15/2017)   Social Connection and Isolation Panel [NHANES]    Frequency of  Communication with Friends and Family: Patient declined    Frequency of Social Gatherings with Friends and Family: Patient declined    Attends Religious Services: Patient declined    Database administrator or Organizations: Patient declined    Attends Banker Meetings: Patient declined    Marital Status: Patient declined  Intimate Partner Violence: Unknown (11/15/2017)   Humiliation, Afraid, Rape, and Kick questionnaire    Fear of  Current or Ex-Partner: Patient declined    Emotionally Abused: Patient declined    Physically Abused: Patient declined    Sexually Abused: Patient declined    Past Surgical History:  Procedure Laterality Date   A/V FISTULAGRAM Left 09/02/2021   Procedure: A/V Fistulagram;  Surgeon: Renford Dills, MD;  Location: [facility];  Service: Cardiovascular;  Laterality: Left;   AV FISTULA PLACEMENT Left 07/22/2021   Procedure: ARTERIOVENOUS (AV) FISTULA CREATION (RADIALCEPHALIC);  Surgeon: Renford Dills, MD;  Location: [facility];  Service: Vascular;  Laterality: Left;   AV FISTULA PLACEMENT Left 12/09/2021   Procedure: ARTERIOVENOUS (AV) FISTULA CREATION (BRACHIAL CEPHALIC);  Surgeon: Renford Dills, MD;  Location: [facility];  Service: Vascular;  Laterality: Left;   BREAST BIOPSY Right 2014   breast ca   BREAST EXCISIONAL BIOPSY Right 07/19/2012   breast ca   BREAST SURGERY Right 2014   with sentinel node bx subareaolar duct excision   CARDIOVERSION N/A 11/28/2018   Procedure: CARDIOVERSION;  Surgeon: Antonieta Iba, MD;  Location: [facility];  Service: Cardiovascular;  Laterality: N/A;   COLONOSCOPY WITH PROPOFOL N/A 01/30/2017   Procedure: COLONOSCOPY WITH PROPOFOL;  Surgeon: Kieth Brightly, MD;  Location: [facility];  Service: Endoscopy;  Laterality: N/A;   CYSTOSCOPY W/ RETROGRADES  06/17/2021   Procedure: CYSTOSCOPY WITH RETROGRADE PYELOGRAM;  Surgeon: Sondra Come, MD;  Location: [facility];  Service: Urology;;    CYSTOSCOPY W/ URETERAL STENT PLACEMENT Left 02/14/2021   Procedure: CYSTOSCOPY WITH RETROGRADE PYELOGRAM/URETERAL STENT PLACEMENT;  Surgeon: Sondra Come, MD;  Location: [facility];  Service: Urology;  Laterality: Left;   CYSTOSCOPY/URETEROSCOPY/HOLMIUM LASER/STENT PLACEMENT Left 06/17/2021   Procedure: CYSTOSCOPY/URETEROSCOPY/HOLMIUM LASER/STENT EXCHANGE;  Surgeon: Sondra Come, MD;  Location: [facility];  Service: Urology;  Laterality: Left;   DIALYSIS/PERMA CATHETER INSERTION N/A 02/24/2021   Procedure: DIALYSIS/PERMA CATHETER INSERTION;  Surgeon: Annice Needy, MD;  Location: [facility];  Service: Cardiovascular;  Laterality: N/A;   DIALYSIS/PERMA CATHETER INSERTION N/A 12/16/2021   Procedure: DIALYSIS/PERMA CATHETER INSERTION;  Surgeon: Renford Dills, MD;  Location: [facility];  Service: Cardiovascular;  Laterality: N/A;   PERIPHERAL VASCULAR THROMBECTOMY Left 09/28/2021   Procedure: PERIPHERAL VASCULAR THROMBECTOMY;  Surgeon: Renford Dills, MD;  Location: [facility];  Service: Cardiovascular;  Laterality: Left;   REVISON OF ARTERIOVENOUS FISTULA Left 04/28/2022   Procedure: REVISON OF ARTERIOVENOUS FISTULA (BRACHIAL CEPHALIC);  Surgeon: Renford Dills, MD;  Location: [facility];  Service: Vascular;  Laterality: Left;    Family History  Problem Relation Age of Onset   Diabetes Father    Hypertension Father    Parkinson's disease Father    Hypertension Mother    Rheum arthritis Mother    Atrial fibrillation Mother    Heart failure Mother    Heart attack Mother        died in her mid-70's.   Colon cancer Cousin 54   Breast cancer Neg Hx     Allergies  Allergen Reactions   Spironolactone     Hyperkalemia  Pt unaware of this allergy       Latest Ref Rng & Units 04/28/2022    6:39 AM 04/21/2022   11:09 AM 12/16/2021    8:30 AM  CBC  WBC 4.0 - 10.5 K/uL  8.4  9.1   Hemoglobin 12.0 - 15.0 g/dL 16.1  09.6  04.5   Hematocrit 36.0 - 46.0 % 39.0   40.0  34.7  Platelets 150 - 400 K/uL  196  227       CMP     Component Value Date/Time   NA 137 04/28/2022 0639   NA 144 02/27/2020 0931   NA 141 03/09/2014 1533   K 3.8 04/28/2022 0639   K 4.2 03/09/2014 1533   CL 97 (L) 04/28/2022 0639   CL 101 03/09/2014 1533   CO2 29 04/21/2022 1109   CO2 30 03/09/2014 1533   GLUCOSE 131 (H) 04/28/2022 0639   GLUCOSE 95 03/09/2014 1533   BUN 28 (H) 04/28/2022 0639   BUN 40 (H) 02/27/2020 0931   BUN 35 (H) 03/09/2014 1533   CREATININE 7.10 (H) 04/28/2022 0639   CREATININE 1.50 (H) 03/09/2014 1533   CALCIUM 8.4 (L) 04/21/2022 1109   CALCIUM 8.8 03/09/2014 1533   PROT 7.5 07/26/2021 2222   PROT 7.3 03/09/2014 1533   ALBUMIN 3.6 07/26/2021 2222   ALBUMIN 3.5 03/09/2014 1533   AST 17 07/26/2021 2222   AST 16 03/09/2014 1533   ALT 6 07/26/2021 2222   ALT 23 03/09/2014 1533   ALKPHOS 162 (H) 07/26/2021 2222   ALKPHOS 113 03/09/2014 1533   BILITOT 0.8 07/26/2021 2222   BILITOT 0.3 03/09/2014 1533   GFRNONAA 6 (L) 04/21/2022 1109   GFRNONAA 39 (L) 03/09/2014 1533   GFRNONAA 55 (L) 07/31/2013 1521   GFRAA 35 (L) 02/27/2020 0931   GFRAA 47 (L) 03/09/2014 1533   GFRAA >60 07/31/2013 1521     No results found.     Assessment & Plan:   1. ESRD (end stage renal disease) (HCC) Recommend:  The patient is experiencing increasing problems with their dialysis access.  Patient should have a fistulagram with the intention for intervention.  The intention for intervention is to restore appropriate flow and prevent thrombosis and possible loss of the access.  As well as improve the quality of dialysis therapy.  The risks, benefits and alternative therapies were reviewed in detail with the patient.  All questions were answered.  The patient agrees to proceed with angio/intervention.    The patient will follow up with me in the office after the procedure.   2. Essential hypertension Continue antihypertensive medications as already  ordered, these medications have been reviewed and there are no changes at this time.  3. Dyslipidemia Continue statin as ordered and reviewed, no changes at this time   Current Outpatient Medications on File Prior to Visit  Medication Sig Dispense Refill   acetaminophen (TYLENOL) 325 MG tablet Take 2 tablets (650 mg total) by mouth every 6 (six) hours as needed for mild pain (or Fever >/= 101).     albuterol (VENTOLIN HFA) 108 (90 Base) MCG/ACT inhaler Inhale 2 puffs into the lungs every 6 (six) hours as needed for wheezing or shortness of breath. 8.5 g 0   apixaban (ELIQUIS) 5 MG TABS tablet Take 1 tablet (5 mg total) by mouth 2 (two) times daily. 180 tablet 1   atorvastatin (LIPITOR) 40 MG tablet Take 40 mg by mouth daily.     cloNIDine (CATAPRES) 0.2 MG tablet Take 0.2 mg by mouth 2 (two) times daily.     CRANBERRY PO Take 1 capsule by mouth daily.     ergocalciferol (VITAMIN D2) 1.25 MG (50000 UT) capsule Take 1 capsule (50,000 Units total) by mouth once a week. 5 capsule 0   ezetimibe (ZETIA) 10 MG tablet Take 10 mg by mouth daily with supper.     ferric gluconate 250  mg in sodium chloride 0.9 % 250 mL Inject 250 mg into the vein Every Tuesday,Thursday,and Saturday with dialysis.     folic acid (FOLVITE) 1 MG tablet Take 1 tablet (1 mg total) by mouth daily. 30 tablet 0   gabapentin (NEURONTIN) 300 MG capsule Take 1 capsule (300 mg total) by mouth at bedtime. 30 capsule 0   HYDROcodone-acetaminophen (NORCO) 5-325 MG tablet Take 1 tablet by mouth every 6 (six) hours as needed for moderate pain. 30 tablet 0   Lancets (ONETOUCH DELICA PLUS LANCET30G) MISC      levothyroxine (SYNTHROID) 112 MCG tablet Take 1 tablet (112 mcg total) by mouth daily before breakfast. 30 tablet 0   melatonin 5 MG TABS Take 2 tablets (10 mg total) by mouth at bedtime as needed (sleep). 60 tablet 0   metoprolol succinate (TOPROL-XL) 25 MG 24 hr tablet Take 1 tablet (25 mg total) by mouth daily. Take with or  immediately following a meal. (Patient taking differently: Take 25 mg by mouth at bedtime. Take with or immediately following a meal.) 90 tablet 3   midodrine (PROAMATINE) 10 MG tablet Take 1 tablet (10 mg total) by mouth Every Tuesday,Thursday,and Saturday with dialysis. 30 tablet 0   ONETOUCH ULTRA test strip      polyethylene glycol powder (GLYCOLAX/MIRALAX) 17 GM/SCOOP powder Take 1 Container by mouth once.     sevelamer carbonate (RENVELA) 800 MG tablet Take 1 tablet (800 mg total) by mouth 3 (three) times daily with meals. 90 tablet 0   traMADol (ULTRAM) 50 MG tablet Take 1 tablet (50 mg total) by mouth every 12 (twelve) hours as needed for moderate pain. 30 tablet 0   TRULICITY 1.5 MG/0.5ML SOPN Inject 1.5 mg into the skin every Monday.     VELPHORO 500 MG chewable tablet Chew 1,000 mg by mouth 3 (three) times daily.     No current facility-administered medications on file prior to visit.    There are no Patient Instructions on file for this visit. No follow-ups on file.   Georgiana Spinner, NP

## 2022-06-20 NOTE — H&P (View-Only) (Signed)
 Subjective:    Patient ID: Nicole Schmidt, female    DOB: 09/05/1965, 56 y.o.   MRN: [id number] Chief Complaint  Patient presents with   Follow-up    f/u in 3 weeks with HDA    The patient returns to the office for follow up regarding a problem with their dialysis access.   The patient notes a significant increase in bleeding time after decannulation.  The patient has also been informed that there is increased recirculation.    The patient denies hand pain or other symptoms consistent with steal phenomena.  No significant arm swelling.  The patient denies redness or swelling at the access site. The patient denies fever or chills at home or while on dialysis.  No recent shortening of the patient's walking distance or new symptoms consistent with claudication.  No history of rest pain symptoms. No new ulcers or wounds of the lower extremities have occurred.  The patient denies amaurosis fugax or recent TIA symptoms. There are no recent neurological changes noted. There is no history of DVT, PE or superficial thrombophlebitis. No recent episodes of angina or shortness of breath documented.   Duplex ultrasound of the AV access shows a patent access.  The previously noted stenosis is significantly increased compared to last study.  Flow volume today is 1365 cc/min (previous flow volume was 903 cc/min) there is a noted level of significant stenosis at the shoulder area    Review of Systems  All other systems reviewed and are negative.      Objective:   Physical Exam Vitals reviewed.  HENT:     Head: Normocephalic.  Cardiovascular:     Rate and Rhythm: Normal rate.     Pulses:          Radial pulses are 2+ on the left side.     Arteriovenous access: Left arteriovenous access is present.    Comments: Whistling bruit Pulmonary:     Effort: Pulmonary effort is normal.  Skin:    General: Skin is warm and dry.  Neurological:     Mental Status: She is alert and oriented to person,  place, and time.  Psychiatric:        Mood and Affect: Mood normal.        Behavior: Behavior normal.        Thought Content: Thought content normal.        Judgment: Judgment normal.     BP 120/67 (BP Location: Right Wrist)   Pulse 89   Resp 18   Ht 5' 4" (1.626 m)   Wt (!) 345 lb (156.5 kg)   LMP 07/17/2012   BMI 59.22 kg/m   Past Medical History:  Diagnosis Date   (HFpEF) heart failure with preserved ejection fraction (HCC)    a. 10/2018 Echo: EF 60-65%, diast dysfxn, RVSP 50.7 mmHg; b. 01/2020 Echo: EF 65-70%, G2DD; c. 01/2021 Echo: EF 60-65%, no rwma, mod LVH. Nl RV fxn, mildly enlarged RV. Mod BAE. Triv MR. d. 05/2021 Echo: EF 55-60; no RWMAs, mild LVH, mild TR.   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 Er/PR pos, her 2 negative.  Margins 1 mm.   COPD (chronic obstructive pulmonary disease) (HCC)    Diabetes mellitus type   2 in obese 02/09/2020   Difficult intubation 09/28/2021   a.) mallampati III-IV; BMI >55 kg/m   Diverticulosis    Dyslipidemia 02/09/2020   ESBL (extended spectrum beta-lactamase) producing bacteria infection 03/20/2021   a. urine culture (+) for ESBL producing E.coli and P. mirabilis   ESRD (end stage renal disease) on dialysis (HCC)    a. T-Th-Sat HD initiated 02/2021   Essential hypertension    Gout    History of kidney stones    Long term current use of anticoagulant    a.) apixaban   Menopause    age 44   Morbid obesity (HCC)    OSA treated with BiPAP    a. on nocturnal PAP therapy; Trilogy 100 ventilator   Permanent atrial fibrillation (HCC)    a. Dx 10/2018 in setting of PNA. b. CHA2DS2-VASc = 7 (sex, HFpEF, HTN, PE x2, aortic plaque, T2DM); c. s/p successful DCCV 11/2018. d. subsequent recurrent Afib; rate/rhythm maintained on oral  metoprolol succinate; chronically anticoagulated using standard dose apixaban.   Personal history of radiation therapy    Pulmonary embolism (HCC) 10/2014   a. on chronic apixaban   Restrictive lung disease    Shingles 10/24/2021   Sleep difficulties    a.) takes melatonin   Thyroid goiter     Social History   Socioeconomic History   Marital status: Single    Spouse name: Not on file   Number of children: Not on file   Years of education: Not on file   Highest education level: Not on file  Occupational History   Occupation: retired  Tobacco Use   Smoking status: Never    Passive exposure: Never   Smokeless tobacco: Never  Vaping Use   Vaping Use: Never used  Substance and Sexual Activity   Alcohol use: No    Alcohol/week: 0.0 standard drinks of alcohol   Drug use: No   Sexual activity: Not on file  Other Topics Concern   Not on file  Social History Narrative   Lives in [place] by herself and retired.    Social Determinants of Health   Financial Resource Strain: Unknown (11/15/2017)   Overall Financial Resource Strain (CARDIA)    Difficulty of Paying Living Expenses: Patient declined  Food Insecurity: Unknown (11/15/2017)   Hunger Vital Sign    Worried About Running Out of Food in the Last Year: Patient declined    Ran Out of Food in the Last Year: Patient declined  Transportation Needs: Unknown (11/15/2017)   PRAPARE - Transportation    Lack of Transportation (Medical): Patient declined    Lack of Transportation (Non-Medical): Patient declined  Physical Activity: Unknown (11/15/2017)   Exercise Vital Sign    Days of Exercise per Week: Patient declined    Minutes of Exercise per Session: Patient declined  Stress: Unknown (11/15/2017)   [employer] - Occupational Stress Questionnaire    Feeling of Stress : Patient declined  Social Connections: Unknown (11/15/2017)   Social Connection and Isolation Panel [NHANES]    Frequency of  Communication with Friends and Family: Patient declined    Frequency of Social Gatherings with Friends and Family: Patient declined    Attends Religious Services: Patient declined    Active Member of Clubs or Organizations: Patient declined    Attends Club or Organization Meetings: Patient declined    Marital Status: Patient declined  Intimate Partner Violence: Unknown (11/15/2017)   Humiliation, Afraid, Rape, and Kick questionnaire    Fear of   Current or Ex-Partner: Patient declined    Emotionally Abused: Patient declined    Physically Abused: Patient declined    Sexually Abused: Patient declined    Past Surgical History:  Procedure Laterality Date   A/V FISTULAGRAM Left 09/02/2021   Procedure: A/V Fistulagram;  Surgeon: Schnier, Gregory G, MD;  Location: [facility];  Service: Cardiovascular;  Laterality: Left;   AV FISTULA PLACEMENT Left 07/22/2021   Procedure: ARTERIOVENOUS (AV) FISTULA CREATION (RADIALCEPHALIC);  Surgeon: Schnier, Gregory G, MD;  Location: [facility];  Service: Vascular;  Laterality: Left;   AV FISTULA PLACEMENT Left 12/09/2021   Procedure: ARTERIOVENOUS (AV) FISTULA CREATION (BRACHIAL CEPHALIC);  Surgeon: Schnier, Gregory G, MD;  Location: [facility];  Service: Vascular;  Laterality: Left;   BREAST BIOPSY Right 2014   breast ca   BREAST EXCISIONAL BIOPSY Right 07/19/2012   breast ca   BREAST SURGERY Right 2014   with sentinel node bx subareaolar duct excision   CARDIOVERSION N/A 11/28/2018   Procedure: CARDIOVERSION;  Surgeon: Gollan, Timothy J, MD;  Location: [facility];  Service: Cardiovascular;  Laterality: N/A;   COLONOSCOPY WITH PROPOFOL N/A 01/30/2017   Procedure: COLONOSCOPY WITH PROPOFOL;  Surgeon: Sankar, Seeplaputhur G, MD;  Location: [facility];  Service: Endoscopy;  Laterality: N/A;   CYSTOSCOPY W/ RETROGRADES  06/17/2021   Procedure: CYSTOSCOPY WITH RETROGRADE PYELOGRAM;  Surgeon: Sninsky, Brian C, MD;  Location: [facility];  Service: Urology;;    CYSTOSCOPY W/ URETERAL STENT PLACEMENT Left 02/14/2021   Procedure: CYSTOSCOPY WITH RETROGRADE PYELOGRAM/URETERAL STENT PLACEMENT;  Surgeon: Sninsky, Brian C, MD;  Location: [facility];  Service: Urology;  Laterality: Left;   CYSTOSCOPY/URETEROSCOPY/HOLMIUM LASER/STENT PLACEMENT Left 06/17/2021   Procedure: CYSTOSCOPY/URETEROSCOPY/HOLMIUM LASER/STENT EXCHANGE;  Surgeon: Sninsky, Brian C, MD;  Location: [facility];  Service: Urology;  Laterality: Left;   DIALYSIS/PERMA CATHETER INSERTION N/A 02/24/2021   Procedure: DIALYSIS/PERMA CATHETER INSERTION;  Surgeon: Dew, Jason S, MD;  Location: [facility];  Service: Cardiovascular;  Laterality: N/A;   DIALYSIS/PERMA CATHETER INSERTION N/A 12/16/2021   Procedure: DIALYSIS/PERMA CATHETER INSERTION;  Surgeon: Schnier, Gregory G, MD;  Location: [facility];  Service: Cardiovascular;  Laterality: N/A;   PERIPHERAL VASCULAR THROMBECTOMY Left 09/28/2021   Procedure: PERIPHERAL VASCULAR THROMBECTOMY;  Surgeon: Schnier, Gregory G, MD;  Location: [facility];  Service: Cardiovascular;  Laterality: Left;   REVISON OF ARTERIOVENOUS FISTULA Left 04/28/2022   Procedure: REVISON OF ARTERIOVENOUS FISTULA (BRACHIAL CEPHALIC);  Surgeon: Schnier, Gregory G, MD;  Location: [facility];  Service: Vascular;  Laterality: Left;    Family History  Problem Relation Age of Onset   Diabetes Father    Hypertension Father    Parkinson's disease Father    Hypertension Mother    Rheum arthritis Mother    Atrial fibrillation Mother    Heart failure Mother    Heart attack Mother        died in her mid-70's.   Colon cancer Cousin 54   Breast cancer Neg Hx     Allergies  Allergen Reactions   Spironolactone     Hyperkalemia  Pt unaware of this allergy       Latest Ref Rng & Units 04/28/2022    6:39 AM 04/21/2022   11:09 AM 12/16/2021    8:30 AM  CBC  WBC 4.0 - 10.5 K/uL  8.4  9.1   Hemoglobin 12.0 - 15.0 g/dL 13.3  12.2  10.9   Hematocrit 36.0 - 46.0 % 39.0   40.0  34.7     Platelets 150 - 400 K/uL  196  227       CMP     Component Value Date/Time   NA 137 04/28/2022 0639   NA 144 02/27/2020 0931   NA 141 03/09/2014 1533   K 3.8 04/28/2022 0639   K 4.2 03/09/2014 1533   CL 97 (L) 04/28/2022 0639   CL 101 03/09/2014 1533   CO2 29 04/21/2022 1109   CO2 30 03/09/2014 1533   GLUCOSE 131 (H) 04/28/2022 0639   GLUCOSE 95 03/09/2014 1533   BUN 28 (H) 04/28/2022 0639   BUN 40 (H) 02/27/2020 0931   BUN 35 (H) 03/09/2014 1533   CREATININE 7.10 (H) 04/28/2022 0639   CREATININE 1.50 (H) 03/09/2014 1533   CALCIUM 8.4 (L) 04/21/2022 1109   CALCIUM 8.8 03/09/2014 1533   PROT 7.5 07/26/2021 2222   PROT 7.3 03/09/2014 1533   ALBUMIN 3.6 07/26/2021 2222   ALBUMIN 3.5 03/09/2014 1533   AST 17 07/26/2021 2222   AST 16 03/09/2014 1533   ALT 6 07/26/2021 2222   ALT 23 03/09/2014 1533   ALKPHOS 162 (H) 07/26/2021 2222   ALKPHOS 113 03/09/2014 1533   BILITOT 0.8 07/26/2021 2222   BILITOT 0.3 03/09/2014 1533   GFRNONAA 6 (L) 04/21/2022 1109   GFRNONAA 39 (L) 03/09/2014 1533   GFRNONAA 55 (L) 07/31/2013 1521   GFRAA 35 (L) 02/27/2020 0931   GFRAA 47 (L) 03/09/2014 1533   GFRAA >60 07/31/2013 1521     No results found.     Assessment & Plan:   1. ESRD (end stage renal disease) (HCC) Recommend:  The patient is experiencing increasing problems with their dialysis access.  Patient should have a fistulagram with the intention for intervention.  The intention for intervention is to restore appropriate flow and prevent thrombosis and possible loss of the access.  As well as improve the quality of dialysis therapy.  The risks, benefits and alternative therapies were reviewed in detail with the patient.  All questions were answered.  The patient agrees to proceed with angio/intervention.    The patient will follow up with me in the office after the procedure.   2. Essential hypertension Continue antihypertensive medications as already  ordered, these medications have been reviewed and there are no changes at this time.  3. Dyslipidemia Continue statin as ordered and reviewed, no changes at this time   Current Outpatient Medications on File Prior to Visit  Medication Sig Dispense Refill   acetaminophen (TYLENOL) 325 MG tablet Take 2 tablets (650 mg total) by mouth every 6 (six) hours as needed for mild pain (or Fever >/= 101).     albuterol (VENTOLIN HFA) 108 (90 Base) MCG/ACT inhaler Inhale 2 puffs into the lungs every 6 (six) hours as needed for wheezing or shortness of breath. 8.5 g 0   apixaban (ELIQUIS) 5 MG TABS tablet Take 1 tablet (5 mg total) by mouth 2 (two) times daily. 180 tablet 1   atorvastatin (LIPITOR) 40 MG tablet Take 40 mg by mouth daily.     cloNIDine (CATAPRES) 0.2 MG tablet Take 0.2 mg by mouth 2 (two) times daily.     CRANBERRY PO Take 1 capsule by mouth daily.     ergocalciferol (VITAMIN D2) 1.25 MG (50000 UT) capsule Take 1 capsule (50,000 Units total) by mouth once a week. 5 capsule 0   ezetimibe (ZETIA) 10 MG tablet Take 10 mg by mouth daily with supper.     ferric gluconate 250   mg in sodium chloride 0.9 % 250 mL Inject 250 mg into the vein Every Tuesday,Thursday,and Saturday with dialysis.     folic acid (FOLVITE) 1 MG tablet Take 1 tablet (1 mg total) by mouth daily. 30 tablet 0   gabapentin (NEURONTIN) 300 MG capsule Take 1 capsule (300 mg total) by mouth at bedtime. 30 capsule 0   HYDROcodone-acetaminophen (NORCO) 5-325 MG tablet Take 1 tablet by mouth every 6 (six) hours as needed for moderate pain. 30 tablet 0   Lancets (ONETOUCH DELICA PLUS LANCET30G) MISC      levothyroxine (SYNTHROID) 112 MCG tablet Take 1 tablet (112 mcg total) by mouth daily before breakfast. 30 tablet 0   melatonin 5 MG TABS Take 2 tablets (10 mg total) by mouth at bedtime as needed (sleep). 60 tablet 0   metoprolol succinate (TOPROL-XL) 25 MG 24 hr tablet Take 1 tablet (25 mg total) by mouth daily. Take with or  immediately following a meal. (Patient taking differently: Take 25 mg by mouth at bedtime. Take with or immediately following a meal.) 90 tablet 3   midodrine (PROAMATINE) 10 MG tablet Take 1 tablet (10 mg total) by mouth Every Tuesday,Thursday,and Saturday with dialysis. 30 tablet 0   ONETOUCH ULTRA test strip      polyethylene glycol powder (GLYCOLAX/MIRALAX) 17 GM/SCOOP powder Take 1 Container by mouth once.     sevelamer carbonate (RENVELA) 800 MG tablet Take 1 tablet (800 mg total) by mouth 3 (three) times daily with meals. 90 tablet 0   traMADol (ULTRAM) 50 MG tablet Take 1 tablet (50 mg total) by mouth every 12 (twelve) hours as needed for moderate pain. 30 tablet 0   TRULICITY 1.5 MG/0.5ML SOPN Inject 1.5 mg into the skin every Monday.     VELPHORO 500 MG chewable tablet Chew 1,000 mg by mouth 3 (three) times daily.     No current facility-administered medications on file prior to visit.    There are no Patient Instructions on file for this visit. No follow-ups on file.   Mattelyn Imhoff E Olinda Nola, NP   

## 2022-06-22 ENCOUNTER — Telehealth (INDEPENDENT_AMBULATORY_CARE_PROVIDER_SITE_OTHER): Payer: Self-pay

## 2022-06-22 NOTE — Telephone Encounter (Signed)
I spoke with the patient on 06/19/22 and the patient is scheduled with Dr. Gilda Crease for a left arm fistulagram w/ anesthesia on 06/27/22 with a 11:00 am arrival time to the [facility]. Pre-procedure instructions were discussed along with meds to not take due to the anesthesia as well. This will be sent to Mychart and mailed.

## 2022-06-23 DIAGNOSIS — I482 Chronic atrial fibrillation, unspecified: Secondary | ICD-10-CM | POA: Diagnosis not present

## 2022-06-23 DIAGNOSIS — G4733 Obstructive sleep apnea (adult) (pediatric): Secondary | ICD-10-CM | POA: Diagnosis not present

## 2022-06-23 DIAGNOSIS — I5032 Chronic diastolic (congestive) heart failure: Secondary | ICD-10-CM | POA: Diagnosis not present

## 2022-06-23 DIAGNOSIS — J9601 Acute respiratory failure with hypoxia: Secondary | ICD-10-CM | POA: Diagnosis not present

## 2022-06-27 ENCOUNTER — Ambulatory Visit
Admission: RE | Admit: 2022-06-27 | Discharge: 2022-06-27 | Disposition: A | Discharge status: Home / Self Care | Payer: BC Managed Care – PPO | Attending: Vascular Surgery | Admitting: Vascular Surgery

## 2022-06-27 ENCOUNTER — Encounter: Payer: Self-pay | Admitting: Vascular Surgery

## 2022-06-27 ENCOUNTER — Ambulatory Visit: Payer: BC Managed Care – PPO | Admitting: Certified Registered"

## 2022-06-27 ENCOUNTER — Other Ambulatory Visit: Payer: Self-pay

## 2022-06-27 ENCOUNTER — Encounter
Admission: RE | Disposition: A | Discharge status: Home / Self Care | Payer: Self-pay | Source: Home / Self Care | Attending: Vascular Surgery

## 2022-06-27 DIAGNOSIS — I132 Hypertensive heart and chronic kidney disease with heart failure and with stage 5 chronic kidney disease, or end stage renal disease: Secondary | ICD-10-CM | POA: Diagnosis not present

## 2022-06-27 DIAGNOSIS — E1122 Type 2 diabetes mellitus with diabetic chronic kidney disease: Secondary | ICD-10-CM | POA: Insufficient documentation

## 2022-06-27 DIAGNOSIS — Z833 Family history of diabetes mellitus: Secondary | ICD-10-CM | POA: Diagnosis not present

## 2022-06-27 DIAGNOSIS — Y832 Surgical operation with anastomosis, bypass or graft as the cause of abnormal reaction of the patient, or of later complication, without mention of misadventure at the time of the procedure: Secondary | ICD-10-CM | POA: Diagnosis not present

## 2022-06-27 DIAGNOSIS — Z8249 Family history of ischemic heart disease and other diseases of the circulatory system: Secondary | ICD-10-CM | POA: Insufficient documentation

## 2022-06-27 DIAGNOSIS — N186 End stage renal disease: Secondary | ICD-10-CM | POA: Insufficient documentation

## 2022-06-27 DIAGNOSIS — E785 Hyperlipidemia, unspecified: Secondary | ICD-10-CM | POA: Insufficient documentation

## 2022-06-27 DIAGNOSIS — Z992 Dependence on renal dialysis: Secondary | ICD-10-CM | POA: Diagnosis not present

## 2022-06-27 DIAGNOSIS — T82858A Stenosis of vascular prosthetic devices, implants and grafts, initial encounter: Secondary | ICD-10-CM

## 2022-06-27 DIAGNOSIS — Z7985 Long-term (current) use of injectable non-insulin antidiabetic drugs: Secondary | ICD-10-CM | POA: Diagnosis not present

## 2022-06-27 DIAGNOSIS — I509 Heart failure, unspecified: Secondary | ICD-10-CM | POA: Diagnosis not present

## 2022-06-27 HISTORY — PX: A/V FISTULAGRAM: CATH118298

## 2022-06-27 LAB — POTASSIUM (ARMC VASCULAR LAB ONLY): Potassium (ARMC vascular lab): 4.4 mmol/L (ref 3.5–5.1)

## 2022-06-27 LAB — GLUCOSE, CAPILLARY
Glucose-Capillary: 144 mg/dL — ABNORMAL HIGH (ref 70–99)
Glucose-Capillary: 164 mg/dL — ABNORMAL HIGH (ref 70–99)

## 2022-06-27 SURGERY — A/V FISTULAGRAM
Anesthesia: General | Laterality: Left

## 2022-06-27 MED ORDER — PROPOFOL 10 MG/ML IV BOLUS
INTRAVENOUS | Status: DC | PRN
  Administered 2022-06-27: 150 mg via INTRAVENOUS

## 2022-06-27 MED ORDER — DEXAMETHASONE SODIUM PHOSPHATE 10 MG/ML IJ SOLN
INTRAMUSCULAR | Status: DC | PRN
  Administered 2022-06-27: 5 mg via INTRAVENOUS

## 2022-06-27 MED ORDER — ONDANSETRON HCL 4 MG/2ML IJ SOLN: 4.0000 mg | Freq: Four times a day (QID) | INTRAMUSCULAR | Status: DC | PRN

## 2022-06-27 MED ORDER — HYDROMORPHONE HCL 1 MG/ML IJ SOLN: 1.0000 mg | Freq: Once | INTRAMUSCULAR | Status: DC | PRN

## 2022-06-27 MED ORDER — MIDAZOLAM HCL 2 MG/2ML IJ SOLN
INTRAMUSCULAR | Status: AC
  Filled 2022-06-27: qty 2

## 2022-06-27 MED ORDER — METHYLPREDNISOLONE SODIUM SUCC 125 MG IJ SOLR: 125.0000 mg | Freq: Once | INTRAMUSCULAR | Status: DC | PRN

## 2022-06-27 MED ORDER — OXYCODONE HCL 5 MG PO TABS: 5.0000 mg | ORAL_TABLET | Freq: Once | ORAL | Status: DC | PRN

## 2022-06-27 MED ORDER — FENTANYL CITRATE (PF) 100 MCG/2ML IJ SOLN
INTRAMUSCULAR | Status: AC
  Filled 2022-06-27: qty 2

## 2022-06-27 MED ORDER — CEFAZOLIN SODIUM-DEXTROSE 2-4 GM/100ML-% IV SOLN
INTRAVENOUS | Status: AC
  Filled 2022-06-27: qty 100

## 2022-06-27 MED ORDER — FENTANYL CITRATE (PF) 100 MCG/2ML IJ SOLN
INTRAMUSCULAR | Status: DC | PRN
  Administered 2022-06-27: 50 ug via INTRAVENOUS

## 2022-06-27 MED ORDER — SUCCINYLCHOLINE CHLORIDE 200 MG/10ML IV SOSY
PREFILLED_SYRINGE | INTRAVENOUS | Status: DC | PRN
  Administered 2022-06-27: 200 mg via INTRAVENOUS

## 2022-06-27 MED ORDER — SODIUM CHLORIDE 0.9 % IV SOLN: INTRAVENOUS | Status: DC

## 2022-06-27 MED ORDER — OXYCODONE HCL 5 MG/5ML PO SOLN
5.0000 mg | Freq: Once | ORAL | Status: DC | PRN
  Filled 2022-06-27: qty 5

## 2022-06-27 MED ORDER — MIDAZOLAM HCL 2 MG/ML PO SYRP: 8.0000 mg | ORAL_SOLUTION | Freq: Once | ORAL | Status: DC | PRN

## 2022-06-27 MED ORDER — LACTATED RINGERS IV SOLN: INTRAVENOUS | Status: DC | PRN

## 2022-06-27 MED ORDER — GLYCOPYRROLATE 0.2 MG/ML IJ SOLN
INTRAMUSCULAR | Status: DC | PRN
  Administered 2022-06-27: .2 mg via INTRAVENOUS

## 2022-06-27 MED ORDER — PHENYLEPHRINE HCL (PRESSORS) 10 MG/ML IV SOLN
INTRAVENOUS | Status: AC
  Filled 2022-06-27: qty 1

## 2022-06-27 MED ORDER — IODIXANOL 320 MG/ML IV SOLN
INTRAVENOUS | Status: DC | PRN
  Administered 2022-06-27: 40 mL

## 2022-06-27 MED ORDER — PHENYLEPHRINE 80 MCG/ML (10ML) SYRINGE FOR IV PUSH (FOR BLOOD PRESSURE SUPPORT)
PREFILLED_SYRINGE | INTRAVENOUS | Status: DC | PRN
  Administered 2022-06-27 (×2): 80 ug via INTRAVENOUS

## 2022-06-27 MED ORDER — ONDANSETRON HCL 4 MG/2ML IJ SOLN
INTRAMUSCULAR | Status: DC | PRN
  Administered 2022-06-27 (×2): 4 mg via INTRAVENOUS

## 2022-06-27 MED ORDER — LIDOCAINE HCL (CARDIAC) PF 100 MG/5ML IV SOSY
PREFILLED_SYRINGE | INTRAVENOUS | Status: DC | PRN
  Administered 2022-06-27: 100 mg via INTRAVENOUS

## 2022-06-27 MED ORDER — DIPHENHYDRAMINE HCL 50 MG/ML IJ SOLN: 50.0000 mg | Freq: Once | INTRAMUSCULAR | Status: DC | PRN

## 2022-06-27 MED ORDER — HEPARIN SODIUM (PORCINE) 1000 UNIT/ML IJ SOLN
INTRAMUSCULAR | Status: DC | PRN
  Administered 2022-06-27: 4000 [IU] via INTRAVENOUS

## 2022-06-27 MED ORDER — CEFAZOLIN SODIUM-DEXTROSE 2-4 GM/100ML-% IV SOLN
2.0000 g | INTRAVENOUS | Status: AC
  Administered 2022-06-27: 2 g via INTRAVENOUS
  Administered 2022-06-27: 1 g via INTRAVENOUS

## 2022-06-27 MED ORDER — FAMOTIDINE 20 MG PO TABS: 40.0000 mg | ORAL_TABLET | Freq: Once | ORAL | Status: DC | PRN

## 2022-06-27 MED ORDER — CEFAZOLIN SODIUM 1 G IJ SOLR
INTRAMUSCULAR | Status: AC
  Filled 2022-06-27: qty 10

## 2022-06-27 MED ORDER — PROPOFOL 10 MG/ML IV BOLUS
INTRAVENOUS | Status: AC
  Filled 2022-06-27: qty 20

## 2022-06-27 MED ORDER — EPHEDRINE SULFATE (PRESSORS) 50 MG/ML IJ SOLN
INTRAMUSCULAR | Status: DC | PRN
  Administered 2022-06-27: 5 mg via INTRAVENOUS

## 2022-06-27 MED ORDER — FENTANYL CITRATE (PF) 100 MCG/2ML IJ SOLN: 25.0000 ug | INTRAMUSCULAR | Status: DC | PRN

## 2022-06-27 MED ORDER — CEFAZOLIN IN SODIUM CHLORIDE 3-0.9 GM/100ML-% IV SOLN
3.0000 g | INTRAVENOUS | Status: DC
  Filled 2022-06-27: qty 100

## 2022-06-27 MED ORDER — VASOPRESSIN 20 UNIT/ML IV SOLN
INTRAVENOUS | Status: AC
  Filled 2022-06-27: qty 1

## 2022-06-27 MED ORDER — VASOPRESSIN 20 UNIT/ML IV SOLN
INTRAVENOUS | Status: DC | PRN
  Administered 2022-06-27: 1 [IU] via INTRAVENOUS
  Administered 2022-06-27: 2 [IU] via INTRAVENOUS
  Administered 2022-06-27: 1 [IU] via INTRAVENOUS

## 2022-06-27 SURGICAL SUPPLY — 22 items
BALLN DORADO 8X100X80 (BALLOONS) ×1
BALLN LUTONIX AV 8X60X75 (BALLOONS) ×1
BALLOON DORADO 8X100X80 (BALLOONS) IMPLANT
BALLOON LUTONIX AV 8X60X75 (BALLOONS) IMPLANT
CATH BEACON 5 .035 40 KMP TP (CATHETERS) IMPLANT
CATH BEACON 5 .038 40 KMP TP (CATHETERS) ×1
COVER PROBE ULTRASOUND 5X96 (MISCELLANEOUS) IMPLANT
DRAPE BRACHIAL (DRAPES) IMPLANT
GOWN STRL REUS W/ TWL LRG LVL3 (GOWN DISPOSABLE) ×1 IMPLANT
GOWN STRL REUS W/TWL LRG LVL3 (GOWN DISPOSABLE) ×1
KIT ENCORE 26 ADVANTAGE (KITS) IMPLANT
NDL ENTRY 21GA 7CM ECHOTIP (NEEDLE) IMPLANT
NEEDLE ENTRY 21GA 7CM ECHOTIP (NEEDLE) ×1 IMPLANT
PACK ANGIOGRAPHY (CUSTOM PROCEDURE TRAY) ×1 IMPLANT
SET INTRO CAPELLA COAXIAL (SET/KITS/TRAYS/PACK) IMPLANT
SHEATH BRITE TIP 6FRX5.5 (SHEATH) IMPLANT
SHEATH BRITE TIP 7FRX5.5 (SHEATH) IMPLANT
STENT VIABAHN 8X150X120 (Permanent Stent) ×1 IMPLANT
STENT VIABAHN 8X15X120 7FR (Permanent Stent) IMPLANT
SUT MNCRL AB 4-0 PS2 18 (SUTURE) IMPLANT
WIRE G 018X200 V18 (WIRE) IMPLANT
WIRE SUPRACORE 190CM (WIRE) IMPLANT

## 2022-06-27 NOTE — Op Note (Signed)
OPERATIVE NOTE   PROCEDURE: Contrast injection left brachiocephalic AV access Percutaneous transluminal angioplasty and stent placement peripheral segment left brachiocephalic fistula  PRE-OPERATIVE DIAGNOSIS: Complication of dialysis access                                                       End Stage Renal Disease  POST-OPERATIVE DIAGNOSIS: same as above   SURGEON: Renford Dills, M.D.  ANESTHESIA: General anesthesia ESTIMATED BLOOD LOSS: minimal  FINDING(S): String sign in the midportion of the vein at the level of the mid humerus  SPECIMEN(S):  None  CONTRAST: 40 cc  FLUOROSCOPY TIME: 3.2 minutes  INDICATIONS: Nicole Schmidt is a 58 y.o. female who  presents with malfunctioning left arm AV access.  The patient is scheduled for angiography with possible intervention of the AV access.  The patient is aware the risks include but are not limited to: bleeding, infection, thrombosis of the cannulated access, and possible anaphylactic reaction to the contrast.  The patient acknowledges if the access can not be salvaged a tunneled catheter will be needed and will be placed during this procedure.  The patient is aware of the risks of the procedure and elects to proceed with the angiogram and intervention.  DESCRIPTION: After full informed written consent was obtained, the patient was brought back to the Special Procedure suite and placed supine position.  Appropriate cardiopulmonary monitors were placed.  The left arm was prepped and draped in the standard fashion.  Appropriate timeout is called. The left arm AV fistula was cannulated with a micropuncture needle.  Cannulation was performed with ultrasound guidance. Ultrasound was placed in a sterile sleeve, the AV access was interrogated and noted to be echolucent and compressible indicating patency. Image was recorded for the permanent record. The puncture is performed under continuous ultrasound visualization.   The microwire was  advanced and the needle was exchanged for  a microsheath.  The J-wire was then advanced and a 7 Fr sheath inserted.  Hand injections were completed to image the access from the arterial anastomosis through the entire access.  The central venous structures were also imaged by hand injections.  Based on the images,  6000 units of heparin was given and a wire was negotiated through the strictures within the venous portion of the graft.  An 8 mm x 150 mm Viabahn was deployed across the stenoses and postdilated with an 8 mm Dorado balloon.  Follow-up imaging demonstrates complete resolution of the stricture with rapid flow of contrast through the graft, the central venous anatomy is preserved.  A 4-0 Monocryl purse-string suture was sewn around the sheath.  The sheath was removed and light pressure was applied.  A sterile bandage was applied to the puncture site.  Interpretation:  The cephalic vein has been transposed to a more superficial location.  The arterial anastomosis and visualized portion of the brachial artery are widely patent.  In the mid cephalic vein at the level of the mid humerus there is a 5 to 6 cm long string sign.  Proximal to this lesion the cephalic vein appears normal and 7 to 8 mm in diameter.  The confluence of the cephalic subclavian veins is widely patent.  Central veins are widely patent.  Following angioplasty and stent placement with dilatation to 8 mm there is wide patency  of the entire access  COMPLICATIONS: None  CONDITION: Good  Renford Dills, M.D  [facility] Office: [phone]  06/27/2022 1:20 PM

## 2022-06-27 NOTE — Anesthesia Procedure Notes (Addendum)
Procedure Name: Intubation Date/Time: 06/27/2022 12:07 PM  Performed by: Mohammed Kindle, CRNAPre-anesthesia Checklist: Patient identified, Emergency Drugs available, Suction available and Patient being monitored Patient Re-evaluated:Patient Re-evaluated prior to induction Oxygen Delivery Method: Circle system utilized Preoxygenation: Pre-oxygenation with 100% oxygen Induction Type: IV induction Ventilation: Mask ventilation without difficulty Laryngoscope Size: McGraph and 3 Grade View: Grade I Tube type: Oral Tube size: 7.0 mm Number of attempts: 1 Airway Equipment and Method: Stylet and Oral airway Placement Confirmation: ETT inserted through vocal cords under direct vision, positive ETCO2, breath sounds checked- equal and bilateral and CO2 detector Secured at: 21 cm Tube secured with: Tape Dental Injury: Teeth and Oropharynx as per pre-operative assessment  Comments: Atraumatic

## 2022-06-27 NOTE — Interval H&P Note (Signed)
History and Physical Interval Note:  06/27/2022 11:18 AM  Nicole Schmidt  has presented today for surgery, with the diagnosis of L arm fistulagram  ANESTHESIA  End Stage Renal.  The various methods of treatment have been discussed with the patient and family. After consideration of risks, benefits and other options for treatment, the patient has consented to  Procedure(s): A/V Fistulagram (Left) as a surgical intervention.  The patient's history has been reviewed, patient examined, no change in status, stable for surgery.  I have reviewed the patient's chart and labs.  Questions were answered to the patient's satisfaction.     Levora Dredge

## 2022-06-27 NOTE — Transfer of Care (Signed)
Immediate Anesthesia Transfer of Care Note  Patient: Nicole Schmidt  Procedure(s) Performed: A/V Fistulagram (Left)  Patient Location: PACU  Anesthesia Type:General  Level of Consciousness: awake, drowsy, and patient cooperative  Airway & Oxygen Therapy: Patient Spontanous Breathing and Patient connected to face mask oxygen  Post-op Assessment: Report given to RN and Post -op Vital signs reviewed and stable  Post vital signs: Reviewed and stable  Last Vitals:  Vitals Value Taken Time  BP 118/68 06/27/22 1315  Temp 36.4 C 06/27/22 1310  Pulse 90 06/27/22 1323  Resp 14 06/27/22 1323  SpO2 90 % 06/27/22 1323  Vitals shown include unvalidated device data.  Last Pain:  Vitals:   06/27/22 1310  TempSrc:   PainSc: 0-No pain         Complications: No notable events documented.

## 2022-06-27 NOTE — Anesthesia Preprocedure Evaluation (Signed)
Anesthesia Evaluation  Patient identified by MRN, date of birth, ID band Patient awake  General Assessment Comment:  In past anesthetic, patient retaining CO2 upon extubation, needing BiPAP in PACU. Easy intubations in prior records.  Reviewed: Allergy & Precautions, NPO status , Patient's Chart, lab work & pertinent test results  History of Anesthesia Complications Negative for: history of anesthetic complications  Airway Mallampati: I  TM Distance: >3 FB Neck ROM: Full    Dental  (+) Missing, Chipped,    Pulmonary sleep apnea, Continuous Positive Airway Pressure Ventilation and Oxygen sleep apnea , COPD, Patient abstained from smoking.Not current smoker, PE H/o PE 2016  2L at night  restrictive lung disease    + decreased breath sounds      Cardiovascular Exercise Tolerance: Poor METS: < 3 Mets hypertension, Pt. on medications +CHF  (-) CAD and (-) Past MI + dysrhythmias Atrial Fibrillation  Rhythm:Regular Rate:Normal - Systolic murmurs 1. Left ventricular ejection fraction, by estimation, is 60 to 65%. The  left ventricle has normal function. Left ventricular endocardial border  not optimally defined to evaluate regional wall motion. There is moderate  left ventricular hypertrophy. Left  ventricular diastolic function could not be evaluated.  2. Right ventricular systolic function is normal. The right ventricular  size is mildly enlarged.  3. Left atrial size was moderately dilated.  4. Right atrial size was moderately dilated.  5. The mitral valve is normal in structure. Trivial mitral valve  regurgitation.  6. The aortic valve has an indeterminant number of cusps. Aortic valve  regurgitation is not visualized. No aortic stenosis is present.  7. The inferior vena cava is normal in size with greater than 50%  respiratory variability, suggesting right atrial pressure of 3 mmHg.    Neuro/Psych negative  neurological ROS  negative psych ROS   GI/Hepatic ,neg GERD  ,,(+)     (-) substance abuse    Endo/Other  diabetes, Well Controlled, Type 2Hypothyroidism  Morbid obesityOn weekly GLP1 injection  Renal/GU ESRF and DialysisRenal disease     Musculoskeletal  (+) Arthritis ,    Abdominal  (+) + obese  Peds  Hematology   Anesthesia Other Findings Past Medical History: No date: (HFpEF) heart failure with preserved ejection fraction (HCC)     Comment:  a. 10/2018 Echo: EF 60-65%, diast dysfxn, RVSP 50.7 mmHg,              mildly dil LA. 02/09/2020: Acquired hypothyroidism No date: Anemia No date: Arthritis 2014: Breast cancer (HCC)     Comment:  right breast cancer No date: Breast cancer of upper-inner quadrant of right female breast  (HCC)     Comment:  Right breast invasive CA and DCIS , 7 mm T1,N0,M0. Er/PR              pos, her 2 negative.  Margins 1 mm. No date: CHF (congestive heart failure) (HCC) No date: CKD (chronic kidney disease), stage III (HCC) 02/09/2020: Diabetes mellitus type 2 in obese (HCC) No date: Diabetes mellitus without complication (HCC) 02/09/2020: Dyslipidemia No date: Essential hypertension No date: Gout No date: Hypothyroidism No date: Menopause     Comment:  age 57 No date: Morbid obesity (HCC) No date: Persistent atrial fibrillation (HCC)     Comment:  a. Dx 10/2018 in setting of PNA. CHA2DS2VASc =               4-->Eliquis; b. 11/2018 s/p successful DCCV. No date: Personal history of radiation therapy 10/2014:  Pulmonary embolism (HCC)     Comment:  a. Chronic eliquis. No date: Thyroid goiter  Reproductive/Obstetrics                             Anesthesia Physical Anesthesia Plan  ASA: 4  Anesthesia Plan: General   Post-op Pain Management: Ofirmev IV (intra-op)   Induction: Intravenous and Rapid sequence  PONV Risk Score and Plan: 3 and Ondansetron, Dexamethasone and Treatment may vary due to age or medical  condition  Airway Management Planned: Oral ETT and Video Laryngoscope Planned  Additional Equipment: None  Intra-op Plan:   Post-operative Plan: Extubation in OR  Informed Consent: I have reviewed the patients History and Physical, chart, labs and discussed the procedure including the risks, benefits and alternatives for the proposed anesthesia with the patient or authorized representative who has indicated his/her understanding and acceptance.     Dental advisory given  Plan Discussed with: CRNA and Surgeon  Anesthesia Plan Comments: (Discussed risks of anesthesia with patient, including PONV, sore throat, lip/dental/eye damage. Rare risks discussed as well, such as cardiorespiratory and neurological sequelae, and allergic reactions. Discussed the role of CRNA in patient's perioperative care. Patient understands. Patient informed about increased incidence of above perioperative risk due to high BMI. Patient understands.  Patient has her CPAP her, can use in PACU.)        Anesthesia Quick Evaluation

## 2022-06-28 ENCOUNTER — Encounter: Payer: Self-pay | Admitting: Vascular Surgery

## 2022-06-28 NOTE — Anesthesia Postprocedure Evaluation (Signed)
Anesthesia Post Note  Patient: Nicole Schmidt  Procedure(s) Performed: A/V Fistulagram (Left)  Patient location during evaluation: PACU Anesthesia Type: General Level of consciousness: awake and alert Pain management: pain level controlled Vital Signs Assessment: post-procedure vital signs reviewed and stable Respiratory status: spontaneous breathing, nonlabored ventilation, respiratory function stable and patient connected to nasal cannula oxygen Cardiovascular status: blood pressure returned to baseline and stable Postop Assessment: no apparent nausea or vomiting Anesthetic complications: no   No notable events documented.   Last Vitals:  Vitals:   06/27/22 1539 06/27/22 1545  BP:  (!) 112/57  Pulse:  90  Resp:  11  Temp:    SpO2: 92%     Last Pain:  Vitals:   06/27/22 1545  TempSrc:   PainSc: 0-No pain                 Louie Boston

## 2022-06-29 ENCOUNTER — Telehealth (INDEPENDENT_AMBULATORY_CARE_PROVIDER_SITE_OTHER): Payer: Self-pay

## 2022-06-29 NOTE — Telephone Encounter (Signed)
I would recommend that she see her PCP.  She may just feel congested because she was under a breathing apparatus but the larger concern is if she has pneumonia and that is something that we typically don't evaluate or treat.  However, given the fact that it was just two days ago, I suspect it is just her body clearing some excess secretions.

## 2022-06-29 NOTE — Telephone Encounter (Signed)
Spoke with pt and she states verbal understanding ?

## 2022-07-19 ENCOUNTER — Other Ambulatory Visit (INDEPENDENT_AMBULATORY_CARE_PROVIDER_SITE_OTHER): Payer: Self-pay | Admitting: Vascular Surgery

## 2022-07-19 DIAGNOSIS — N186 End stage renal disease: Secondary | ICD-10-CM

## 2022-07-20 DIAGNOSIS — E662 Morbid (severe) obesity with alveolar hypoventilation: Secondary | ICD-10-CM | POA: Diagnosis not present

## 2022-07-21 ENCOUNTER — Ambulatory Visit (INDEPENDENT_AMBULATORY_CARE_PROVIDER_SITE_OTHER): Payer: BC Managed Care – PPO

## 2022-07-21 ENCOUNTER — Encounter (INDEPENDENT_AMBULATORY_CARE_PROVIDER_SITE_OTHER): Payer: Self-pay | Admitting: Nurse Practitioner

## 2022-07-21 ENCOUNTER — Ambulatory Visit (INDEPENDENT_AMBULATORY_CARE_PROVIDER_SITE_OTHER): Payer: BC Managed Care – PPO | Admitting: Nurse Practitioner

## 2022-07-21 VITALS — BP 114/70 | HR 102 | Resp 18 | Ht 64.0 in | Wt 348.0 lb

## 2022-07-21 DIAGNOSIS — N186 End stage renal disease: Secondary | ICD-10-CM | POA: Diagnosis not present

## 2022-07-21 DIAGNOSIS — E785 Hyperlipidemia, unspecified: Secondary | ICD-10-CM

## 2022-07-21 DIAGNOSIS — I10 Essential (primary) hypertension: Secondary | ICD-10-CM

## 2022-07-22 ENCOUNTER — Encounter (INDEPENDENT_AMBULATORY_CARE_PROVIDER_SITE_OTHER): Payer: Self-pay | Admitting: Nurse Practitioner

## 2022-07-22 NOTE — Progress Notes (Signed)
Subjective:    Patient ID: Nicole Schmidt, female    DOB: [birth year], 57 y.o.   MRN: [id number] Chief Complaint  Patient presents with   Follow-up    ultrasound    The patient returns to the office for followup status post intervention of their dialysis access a left brachiocephalic AV fistula.    Following the intervention the access function has significantly improved, with better flow rates and improved KT/V.  The patient had an incident of significant bleeding but she notes that the bleeding has been because she realized it was ongoing and was able to quickly have stopped once pressure was held.   The patient denies amaurosis fugax or recent TIA symptoms. There are no recent neurological changes noted. There is no history of DVT, PE or superficial thrombophlebitis. No recent episodes of angina or shortness of breath documented.   Duplex ultrasound of the AV access shows a patent access.  The previously noted stenosis is improved compared to last study.  However, there is a noted stenosis at the confluence today.  Flow volume today is 1358 cc/min (previous flow volume was 1365 cc/min)       Review of Systems  All other systems reviewed and are negative.      Objective:   Physical Exam Vitals reviewed.  HENT:     Head: Normocephalic.  Cardiovascular:     Rate and Rhythm: Normal rate.     Pulses:          Radial pulses are 2+ on the left side.     Arteriovenous access: Left arteriovenous access is present.    Comments: Good thrill and bruit Pulmonary:     Effort: Pulmonary effort is normal.  Skin:    General: Skin is warm and dry.  Neurological:     Mental Status: She is alert and oriented to person, place, and time.  Psychiatric:        Mood and Affect: Mood normal.        Behavior: Behavior normal.        Thought Content: Thought content normal.        Judgment: Judgment normal.     BP 114/70 (BP Location: Right Arm)   Pulse (!) 102   Resp 18   Ht 5\' 4"   (1.626 m)   Wt (!) 348 lb (157.9 kg)   LMP 07/17/2012   BMI 59.73 kg/m   Past Medical History:  Diagnosis Date   (HFpEF) heart failure with preserved ejection fraction (HCC)    a. 10/2018 Echo: EF 60-65%, diast dysfxn, RVSP 50.7 mmHg; b. 01/2020 Echo: EF 65-70%, G2DD; c. 01/2021 Echo: EF 60-65%, no rwma, mod LVH. Nl RV fxn, mildly enlarged RV. Mod BAE. Triv MR. d. 05/2021 Echo: EF 55-60; no RWMAs, mild LVH, mild TR.   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 Er/PR pos, her 2 negative.  Margins 1 mm.   COPD (chronic obstructive pulmonary disease) (HCC)    Diabetes mellitus type 2 in obese 02/09/2020   Difficult intubation 09/28/2021   a.) mallampati III-IV; BMI >55 kg/m   Diverticulosis    Dyslipidemia 02/09/2020   ESBL (extended spectrum beta-lactamase) producing bacteria infection 03/20/2021   a. urine culture (+) for ESBL producing  E.coli and P. mirabilis   ESRD (end stage renal disease) on dialysis (HCC)    a. T-Th-Sat HD initiated 02/2021   Essential hypertension    Gout    History of kidney stones    Long term current use of anticoagulant    a.) apixaban   Menopause    age 48   Morbid obesity (HCC)    OSA treated with BiPAP    a. on nocturnal PAP therapy; Trilogy 100 ventilator   Permanent atrial fibrillation (HCC)    a. Dx 10/2018 in setting of PNA. b. CHA2DS2-VASc = 7 (sex, HFpEF, HTN, PE x2, aortic plaque, T2DM); c. s/p successful DCCV 11/2018. d. subsequent recurrent Afib; rate/rhythm maintained on oral metoprolol succinate; chronically anticoagulated using standard dose apixaban.   Personal history of radiation therapy    Pulmonary embolism (HCC) 10/2014   a. on chronic apixaban   Restrictive lung disease    Shingles 10/24/2021   Sleep difficulties    a.) takes  melatonin   Thyroid goiter     Social History   Socioeconomic History   Marital status: Single    Spouse name: Not on file   Number of children: Not on file   Years of education: Not on file   Highest education level: Not on file  Occupational History   Occupation: retired  Tobacco Use   Smoking status: Never    Passive exposure: Never   Smokeless tobacco: Never  Vaping Use   Vaping Use: Never used  Substance and Sexual Activity   Alcohol use: No    Alcohol/week: 0.0 standard drinks of alcohol   Drug use: No   Sexual activity: Not on file  Other Topics Concern   Not on file  Social History Narrative   Lives in [place] by herself and retired.    Social Determinants of Health   Financial Resource Strain: Unknown (11/15/2017)   Overall Financial Resource Strain (CARDIA)    Difficulty of Paying Living Expenses: Patient declined  Food Insecurity: Unknown (11/15/2017)   Hunger Vital Sign    Worried About Running Out of Food in the Last Year: Patient declined    Ran Out of Food in the Last Year: Patient declined  Transportation Needs: Unknown (11/15/2017)   PRAPARE - Administrator, Civil Service (Medical): Patient declined    Lack of Transportation (Non-Medical): Patient declined  Physical Activity: Unknown (11/15/2017)   Exercise Vital Sign    Days of Exercise per Week: Patient declined    Minutes of Exercise per Session: Patient declined  Stress: Unknown (11/15/2017)   [employer] of Occupational Health - Occupational Stress Questionnaire    Feeling of Stress : Patient declined  Social Connections: Unknown (11/15/2017)   Social Connection and Isolation Panel [NHANES]    Frequency of Communication with Friends and Family: Patient declined    Frequency of Social Gatherings with Friends and Family: Patient declined    Attends Religious Services: Patient declined    Active Member of Clubs or Organizations: Patient declined    Attends Banker  Meetings: Patient declined    Marital Status: Patient declined  Intimate Partner Violence: Unknown (11/15/2017)   Humiliation, Afraid, Rape, and Kick questionnaire    Fear of Current or Ex-Partner: Patient declined    Emotionally Abused: Patient declined    Physically Abused: Patient declined    Sexually Abused: Patient declined    Past Surgical History:  Procedure Laterality Date   A/V FISTULAGRAM Left 09/02/2021  Procedure: A/V Fistulagram;  Surgeon: Renford Dills, MD;  Location: [facility];  Service: Cardiovascular;  Laterality: Left;   A/V FISTULAGRAM Left 06/27/2022   Procedure: A/V Fistulagram;  Surgeon: Renford Dills, MD;  Location: [facility];  Service: Cardiovascular;  Laterality: Left;   AV FISTULA PLACEMENT Left 07/22/2021   Procedure: ARTERIOVENOUS (AV) FISTULA CREATION (RADIALCEPHALIC);  Surgeon: Renford Dills, MD;  Location: [facility];  Service: Vascular;  Laterality: Left;   AV FISTULA PLACEMENT Left 12/09/2021   Procedure: ARTERIOVENOUS (AV) FISTULA CREATION (BRACHIAL CEPHALIC);  Surgeon: Renford Dills, MD;  Location: [facility];  Service: Vascular;  Laterality: Left;   BREAST BIOPSY Right 2014   breast ca   BREAST EXCISIONAL BIOPSY Right 07/19/2012   breast ca   BREAST SURGERY Right 2014   with sentinel node bx subareaolar duct excision   CARDIOVERSION N/A 11/28/2018   Procedure: CARDIOVERSION;  Surgeon: Antonieta Iba, MD;  Location: [facility];  Service: Cardiovascular;  Laterality: N/A;   COLONOSCOPY WITH PROPOFOL N/A 01/30/2017   Procedure: COLONOSCOPY WITH PROPOFOL;  Surgeon: Kieth Brightly, MD;  Location: [facility];  Service: Endoscopy;  Laterality: N/A;   CYSTOSCOPY W/ RETROGRADES  06/17/2021   Procedure: CYSTOSCOPY WITH RETROGRADE PYELOGRAM;  Surgeon: Sondra Come, MD;  Location: [facility];  Service: Urology;;   CYSTOSCOPY W/ URETERAL STENT PLACEMENT Left 02/14/2021   Procedure: CYSTOSCOPY WITH RETROGRADE  PYELOGRAM/URETERAL STENT PLACEMENT;  Surgeon: Sondra Come, MD;  Location: [facility];  Service: Urology;  Laterality: Left;   CYSTOSCOPY/URETEROSCOPY/HOLMIUM LASER/STENT PLACEMENT Left 06/17/2021   Procedure: CYSTOSCOPY/URETEROSCOPY/HOLMIUM LASER/STENT EXCHANGE;  Surgeon: Sondra Come, MD;  Location: [facility];  Service: Urology;  Laterality: Left;   DIALYSIS/PERMA CATHETER INSERTION N/A 02/24/2021   Procedure: DIALYSIS/PERMA CATHETER INSERTION;  Surgeon: Annice Needy, MD;  Location: [facility];  Service: Cardiovascular;  Laterality: N/A;   DIALYSIS/PERMA CATHETER INSERTION N/A 12/16/2021   Procedure: DIALYSIS/PERMA CATHETER INSERTION;  Surgeon: Renford Dills, MD;  Location: [facility];  Service: Cardiovascular;  Laterality: N/A;   PERIPHERAL VASCULAR THROMBECTOMY Left 09/28/2021   Procedure: PERIPHERAL VASCULAR THROMBECTOMY;  Surgeon: Renford Dills, MD;  Location: [facility];  Service: Cardiovascular;  Laterality: Left;   REVISON OF ARTERIOVENOUS FISTULA Left 04/28/2022   Procedure: REVISON OF ARTERIOVENOUS FISTULA (BRACHIAL CEPHALIC);  Surgeon: Renford Dills, MD;  Location: [facility];  Service: Vascular;  Laterality: Left;    Family History  Problem Relation Age of Onset   Diabetes Father    Hypertension Father    Parkinson's disease Father    Hypertension Mother    Rheum arthritis Mother    Atrial fibrillation Mother    Heart failure Mother    Heart attack Mother        died in her mid-70's.   Colon cancer Cousin 54   Breast cancer Neg Hx     Allergies  Allergen Reactions   Spironolactone     Hyperkalemia  Pt unaware of this allergy       Latest Ref Rng & Units 04/28/2022    6:39 AM 04/21/2022   11:09 AM 12/16/2021    8:30 AM  CBC  WBC 4.0 - 10.5 K/uL  8.4  9.1   Hemoglobin 12.0 - 15.0 g/dL 16.1  09.6  04.5   Hematocrit 36.0 - 46.0 % 39.0  40.0  34.7   Platelets 150 - 400 K/uL  196  227  CMP     Component Value  Date/Time   NA 137 04/28/2022 0639   NA 144 02/27/2020 0931   NA 141 03/09/2014 1533   K 3.8 04/28/2022 0639   K 4.2 03/09/2014 1533   CL 97 (L) 04/28/2022 0639   CL 101 03/09/2014 1533   CO2 29 04/21/2022 1109   CO2 30 03/09/2014 1533   GLUCOSE 131 (H) 04/28/2022 0639   GLUCOSE 95 03/09/2014 1533   BUN 28 (H) 04/28/2022 0639   BUN 40 (H) 02/27/2020 0931   BUN 35 (H) 03/09/2014 1533   CREATININE 7.10 (H) 04/28/2022 0639   CREATININE 1.50 (H) 03/09/2014 1533   CALCIUM 8.4 (L) 04/21/2022 1109   CALCIUM 8.8 03/09/2014 1533   PROT 7.5 07/26/2021 2222   PROT 7.3 03/09/2014 1533   ALBUMIN 3.6 07/26/2021 2222   ALBUMIN 3.5 03/09/2014 1533   AST 17 07/26/2021 2222   AST 16 03/09/2014 1533   ALT 6 07/26/2021 2222   ALT 23 03/09/2014 1533   ALKPHOS 162 (H) 07/26/2021 2222   ALKPHOS 113 03/09/2014 1533   BILITOT 0.8 07/26/2021 2222   BILITOT 0.3 03/09/2014 1533   GFRNONAA 6 (L) 04/21/2022 1109   GFRNONAA 39 (L) 03/09/2014 1533   GFRNONAA 55 (L) 07/31/2013 1521     No results found.     Assessment & Plan:   1. ESRD (end stage renal disease) (HCC) The patient has potentially been doing very well but there is a concerning incidental bleeding.  Will have the patient continues access and have her return in 6 weeks in order to reevaluate the stenosis as well as how she has been doing with dialysis.  She instructed that if she is not having any issues in the next few weeks we can remove her PermCath.  However if she notices that there is increased bleeding or issues during dialysis we will likely need to proceed with another fistulogram to treat the stenosis at the confluence  2. Essential hypertension Continue antihypertensive medications as already ordered, these medications have been reviewed and there are no changes at this time.  3. Dyslipidemia Continue statin as ordered and reviewed, no changes at this time   Current Outpatient Medications on File Prior to Visit  Medication  Sig Dispense Refill   acetaminophen (TYLENOL) 325 MG tablet Take 2 tablets (650 mg total) by mouth every 6 (six) hours as needed for mild pain (or Fever >/= 101).     apixaban (ELIQUIS) 5 MG TABS tablet Take 1 tablet (5 mg total) by mouth 2 (two) times daily. 180 tablet 1   atorvastatin (LIPITOR) 40 MG tablet Take 40 mg by mouth daily.     cloNIDine (CATAPRES) 0.2 MG tablet Take 0.2 mg by mouth 2 (two) times daily.     CRANBERRY PO Take 1 capsule by mouth daily.     ergocalciferol (VITAMIN D2) 1.25 MG (50000 UT) capsule Take 1 capsule (50,000 Units total) by mouth once a week. 5 capsule 0   ezetimibe (ZETIA) 10 MG tablet Take 10 mg by mouth daily with supper.     ferric gluconate 250 mg in sodium chloride 0.9 % 250 mL Inject 250 mg into the vein Every Tuesday,Thursday,and Saturday with dialysis.     folic acid (FOLVITE) 1 MG tablet Take 1 tablet (1 mg total) by mouth daily. 30 tablet 0   gabapentin (NEURONTIN) 300 MG capsule Take 1 capsule (300 mg total) by mouth at bedtime. 30 capsule 0   HYDROcodone-acetaminophen (NORCO)  5-325 MG tablet Take 1 tablet by mouth every 6 (six) hours as needed for moderate pain. 30 tablet 0   Lancets (ONETOUCH DELICA PLUS LANCET30G) MISC      levothyroxine (SYNTHROID) 112 MCG tablet Take 1 tablet (112 mcg total) by mouth daily before breakfast. 30 tablet 0   melatonin 5 MG TABS Take 2 tablets (10 mg total) by mouth at bedtime as needed (sleep). 60 tablet 0   metoprolol succinate (TOPROL-XL) 25 MG 24 hr tablet Take 1 tablet (25 mg total) by mouth daily. Take with or immediately following a meal. (Patient taking differently: Take 25 mg by mouth at bedtime. Take with or immediately following a meal.) 90 tablet 3   midodrine (PROAMATINE) 10 MG tablet Take 1 tablet (10 mg total) by mouth Every Tuesday,Thursday,and Saturday with dialysis. 30 tablet 0   ONETOUCH ULTRA test strip      polyethylene glycol powder (GLYCOLAX/MIRALAX) 17 GM/SCOOP powder Take 1 Container by mouth  once.     traMADol (ULTRAM) 50 MG tablet Take 1 tablet (50 mg total) by mouth every 12 (twelve) hours as needed for moderate pain. 30 tablet 0   TRULICITY 1.5 MG/0.5ML SOPN Inject 1.5 mg into the skin every Monday.     VELPHORO 500 MG chewable tablet Chew 1,000 mg by mouth 3 (three) times daily.     albuterol (VENTOLIN HFA) 108 (90 Base) MCG/ACT inhaler Inhale 2 puffs into the lungs every 6 (six) hours as needed for wheezing or shortness of breath. (Patient not taking: Reported on 07/21/2022) 8.5 g 0   sevelamer carbonate (RENVELA) 800 MG tablet Take 1 tablet (800 mg total) by mouth 3 (three) times daily with meals. 90 tablet 0   No current facility-administered medications on file prior to visit.    There are no Patient Instructions on file for this visit. No follow-ups on file.   Georgiana Spinner, NP

## 2022-07-24 DIAGNOSIS — I5032 Chronic diastolic (congestive) heart failure: Secondary | ICD-10-CM | POA: Diagnosis not present

## 2022-07-24 DIAGNOSIS — J9601 Acute respiratory failure with hypoxia: Secondary | ICD-10-CM | POA: Diagnosis not present

## 2022-07-24 DIAGNOSIS — I482 Chronic atrial fibrillation, unspecified: Secondary | ICD-10-CM | POA: Diagnosis not present

## 2022-07-24 DIAGNOSIS — G4733 Obstructive sleep apnea (adult) (pediatric): Secondary | ICD-10-CM | POA: Diagnosis not present

## 2022-07-26 ENCOUNTER — Ambulatory Visit: Payer: BC Managed Care – PPO | Attending: Cardiovascular Disease | Admitting: Cardiovascular Disease

## 2022-07-26 ENCOUNTER — Encounter: Payer: Self-pay | Admitting: Cardiovascular Disease

## 2022-07-26 VITALS — BP 114/58 | HR 87 | Ht 64.0 in | Wt 346.1 lb

## 2022-07-26 DIAGNOSIS — I10 Essential (primary) hypertension: Secondary | ICD-10-CM

## 2022-07-26 DIAGNOSIS — I5032 Chronic diastolic (congestive) heart failure: Secondary | ICD-10-CM

## 2022-07-26 DIAGNOSIS — I482 Chronic atrial fibrillation, unspecified: Secondary | ICD-10-CM | POA: Diagnosis not present

## 2022-07-26 DIAGNOSIS — E785 Hyperlipidemia, unspecified: Secondary | ICD-10-CM | POA: Diagnosis not present

## 2022-07-26 DIAGNOSIS — I700 Atherosclerosis of aorta: Secondary | ICD-10-CM

## 2022-07-26 MED ORDER — METOPROLOL SUCCINATE ER 25 MG PO TB24: 25.0000 mg | ORAL_TABLET | Freq: Every day | ORAL | 3 refills | Status: DC

## 2022-07-26 MED ORDER — CLONIDINE HCL 0.1 MG PO TABS: 0.1000 mg | ORAL_TABLET | Freq: Two times a day (BID) | ORAL | 6 refills | Status: DC

## 2022-07-26 MED ORDER — CLONIDINE HCL 0.1 MG PO TABS: 0.1000 mg | ORAL_TABLET | Freq: Two times a day (BID) | ORAL | 2 refills | Status: DC

## 2022-07-26 NOTE — Progress Notes (Signed)
Cardiology Office Note   Date:  07/26/2022   ID:  Nicole Schmidt, DOB May 02, 1965, MRN [id number]  PCP:  Alan Mulder, MD  Cardiologist:   Lorine Bears, MD   Chief Complaint  Patient presents with   Follow-up    6 month f/u c/o low BP during dialysis. Meds reviewed verbally with pt.      History of Present Illness: Nicole Schmidt is a 57 y.o. female who presents for for follow-up visit regarding permanent atrial fibrillation and chronic diastolic heart failure. She has known history of breast cancer status post radiation, diabetes mellitus, essential hypertension, end-stage renal disease on hemodialysis, thyroid disease, morbid obesity, sleep apnea on CPAP, anemia of chronic disease, gout, pulmonary embolism and chronic diastolic heart failure. She is now on dialysis on TTS schedule.  She reports intermittent hypotension especially on dialysis days.  No chest pain or palpitations.  She has chronic exertional dyspnea that has not changed.  No issues with anticoagulation.  She takes Eliquis 5 mg twice daily.  Past Medical History:  Diagnosis Date   (HFpEF) heart failure with preserved ejection fraction (HCC)    a. 10/2018 Echo: EF 60-65%, diast dysfxn, RVSP 50.7 mmHg; b. 01/2020 Echo: EF 65-70%, G2DD; c. 01/2021 Echo: EF 60-65%, no rwma, mod LVH. Nl RV fxn, mildly enlarged RV. Mod BAE. Triv MR. d. 05/2021 Echo: EF 55-60; no RWMAs, mild LVH, mild TR.   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 Er/PR pos, her 2 negative.  Margins 1 mm.   COPD (chronic obstructive pulmonary disease) (HCC)    Diabetes mellitus type 2 in obese 02/09/2020   Difficult intubation 09/28/2021   a.) mallampati III-IV; BMI >55 kg/m   Diverticulosis    Dyslipidemia 02/09/2020   ESBL (extended spectrum  beta-lactamase) producing bacteria infection 03/20/2021   a. urine culture (+) for ESBL producing E.coli and P. mirabilis   ESRD (end stage renal disease) on dialysis (HCC)    a. T-Th-Sat HD initiated 02/2021   Essential hypertension    Gout    History of kidney stones    Long term current use of anticoagulant    a.) apixaban   Menopause    age 29   Morbid obesity (HCC)    OSA treated with BiPAP    a. on nocturnal PAP therapy; Trilogy 100 ventilator   Permanent atrial fibrillation (HCC)    a. Dx 10/2018 in setting of PNA. b. CHA2DS2-VASc = 7 (sex, HFpEF, HTN, PE x2, aortic plaque, T2DM); c. s/p successful DCCV 11/2018. d. subsequent recurrent Afib; rate/rhythm maintained on oral metoprolol succinate; chronically anticoagulated using standard dose apixaban.   Personal history of radiation therapy    Pulmonary embolism (HCC) 10/2014   a. on chronic apixaban   Restrictive lung disease    Shingles 10/24/2021   Sleep difficulties    a.) takes melatonin   Thyroid goiter     Past Surgical History:  Procedure Laterality Date   A/V FISTULAGRAM Left 09/02/2021   Procedure: A/V Fistulagram;  Surgeon: Renford Dills, MD;  Location: [facility] INVASIVE CV LAB;  Service: Cardiovascular;  Laterality: Left;   A/V FISTULAGRAM Left 06/27/2022   Procedure: A/V Fistulagram;  Surgeon: Renford Dills, MD;  Location: [facility]  INVASIVE CV LAB;  Service: Cardiovascular;  Laterality: Left;   AV FISTULA PLACEMENT Left 07/22/2021   Procedure: ARTERIOVENOUS (AV) FISTULA CREATION (RADIALCEPHALIC);  Surgeon: Renford Dills, MD;  Location: [facility] ORS;  Service: Vascular;  Laterality: Left;   AV FISTULA PLACEMENT Left 12/09/2021   Procedure: ARTERIOVENOUS (AV) FISTULA CREATION (BRACHIAL CEPHALIC);  Surgeon: Renford Dills, MD;  Location: [facility] ORS;  Service: Vascular;  Laterality: Left;   BREAST BIOPSY Right 2014   breast ca   BREAST EXCISIONAL BIOPSY Right 07/19/2012   breast ca   BREAST SURGERY Right 2014    with sentinel node bx subareaolar duct excision   CARDIOVERSION N/A 11/28/2018   Procedure: CARDIOVERSION;  Surgeon: Antonieta Iba, MD;  Location: [facility] ORS;  Service: Cardiovascular;  Laterality: N/A;   COLONOSCOPY WITH PROPOFOL N/A 01/30/2017   Procedure: COLONOSCOPY WITH PROPOFOL;  Surgeon: Kieth Brightly, MD;  Location: [facility] ENDOSCOPY;  Service: Endoscopy;  Laterality: N/A;   CYSTOSCOPY W/ RETROGRADES  06/17/2021   Procedure: CYSTOSCOPY WITH RETROGRADE PYELOGRAM;  Surgeon: Sondra Come, MD;  Location: [facility] ORS;  Service: Urology;;   CYSTOSCOPY W/ URETERAL STENT PLACEMENT Left 02/14/2021   Procedure: CYSTOSCOPY WITH RETROGRADE PYELOGRAM/URETERAL STENT PLACEMENT;  Surgeon: Sondra Come, MD;  Location: [facility] ORS;  Service: Urology;  Laterality: Left;   CYSTOSCOPY/URETEROSCOPY/HOLMIUM LASER/STENT PLACEMENT Left 06/17/2021   Procedure: CYSTOSCOPY/URETEROSCOPY/HOLMIUM LASER/STENT EXCHANGE;  Surgeon: Sondra Come, MD;  Location: [facility] ORS;  Service: Urology;  Laterality: Left;   DIALYSIS/PERMA CATHETER INSERTION N/A 02/24/2021   Procedure: DIALYSIS/PERMA CATHETER INSERTION;  Surgeon: Annice Needy, MD;  Location: [facility] INVASIVE CV LAB;  Service: Cardiovascular;  Laterality: N/A;   DIALYSIS/PERMA CATHETER INSERTION N/A 12/16/2021   Procedure: DIALYSIS/PERMA CATHETER INSERTION;  Surgeon: Renford Dills, MD;  Location: [facility] INVASIVE CV LAB;  Service: Cardiovascular;  Laterality: N/A;   PERIPHERAL VASCULAR THROMBECTOMY Left 09/28/2021   Procedure: PERIPHERAL VASCULAR THROMBECTOMY;  Surgeon: Renford Dills, MD;  Location: [facility] INVASIVE CV LAB;  Service: Cardiovascular;  Laterality: Left;   REVISON OF ARTERIOVENOUS FISTULA Left 04/28/2022   Procedure: REVISON OF ARTERIOVENOUS FISTULA (BRACHIAL CEPHALIC);  Surgeon: Renford Dills, MD;  Location: [facility] ORS;  Service: Vascular;  Laterality: Left;     Current Outpatient Medications  Medication Sig Dispense Refill   acetaminophen  (TYLENOL) 325 MG tablet Take 2 tablets (650 mg total) by mouth every 6 (six) hours as needed for mild pain (or Fever >/= 101).     albuterol (VENTOLIN HFA) 108 (90 Base) MCG/ACT inhaler Inhale 2 puffs into the lungs every 6 (six) hours as needed for wheezing or shortness of breath. 8.5 g 0   apixaban (ELIQUIS) 5 MG TABS tablet Take 1 tablet (5 mg total) by mouth 2 (two) times daily. 180 tablet 1   atorvastatin (LIPITOR) 40 MG tablet Take 40 mg by mouth daily.     cloNIDine (CATAPRES) 0.2 MG tablet Take 0.2 mg by mouth 2 (two) times daily.     CRANBERRY PO Take 1 capsule by mouth daily.     ergocalciferol (VITAMIN D2) 1.25 MG (50000 UT) capsule Take 1 capsule (50,000 Units total) by mouth once a week. 5 capsule 0   ezetimibe (ZETIA) 10 MG tablet Take 10 mg by mouth daily with supper.     ferric gluconate 250 mg in sodium chloride 0.9 % 250 mL Inject 250 mg into the vein Every Tuesday,Thursday,and Saturday with dialysis.     folic acid (FOLVITE) 1 MG tablet Take 1 tablet (1  mg total) by mouth daily. 30 tablet 0   gabapentin (NEURONTIN) 300 MG capsule Take 1 capsule (300 mg total) by mouth at bedtime. 30 capsule 0   HYDROcodone-acetaminophen (NORCO) 5-325 MG tablet Take 1 tablet by mouth every 6 (six) hours as needed for moderate pain. 30 tablet 0   Lancets (ONETOUCH DELICA PLUS LANCET30G) MISC      levothyroxine (SYNTHROID) 112 MCG tablet Take 1 tablet (112 mcg total) by mouth daily before breakfast. 30 tablet 0   melatonin 5 MG TABS Take 2 tablets (10 mg total) by mouth at bedtime as needed (sleep). 60 tablet 0   metoprolol succinate (TOPROL-XL) 25 MG 24 hr tablet Take 1 tablet (25 mg total) by mouth daily. Take with or immediately following a meal. (Patient taking differently: Take 25 mg by mouth at bedtime. Take with or immediately following a meal.) 90 tablet 3   midodrine (PROAMATINE) 10 MG tablet Take 1 tablet (10 mg total) by mouth Every Tuesday,Thursday,and Saturday with dialysis. 30 tablet 0    ONETOUCH ULTRA test strip      polyethylene glycol powder (GLYCOLAX/MIRALAX) 17 GM/SCOOP powder Take 1 Container by mouth once.     traMADol (ULTRAM) 50 MG tablet Take 1 tablet (50 mg total) by mouth every 12 (twelve) hours as needed for moderate pain. 30 tablet 0   TRULICITY 1.5 MG/0.5ML SOPN Inject 1.5 mg into the skin every Monday.     VELPHORO 500 MG chewable tablet Chew 1,000 mg by mouth 3 (three) times daily.     No current facility-administered medications for this visit.    Allergies:   Spironolactone    Social History:  The patient  reports that she has never smoked. She has never been exposed to tobacco smoke. She has never used smokeless tobacco. She reports that she does not drink alcohol and does not use drugs.   Family History:  The patient's family history includes Atrial fibrillation in her mother; Colon cancer (age of onset: 27) in her cousin; Diabetes in her father; Heart attack in her mother; Heart failure in her mother; Hypertension in her father and mother; Parkinson's disease in her father; Rheum arthritis in her mother.    ROS:  Please see the history of present illness.   Otherwise, review of systems are positive for none.   All other systems are reviewed and negative.    PHYSICAL EXAM: VS:  BP (!) 114/58 (BP Location: Left Wrist, Patient Position: Sitting, Cuff Size: Normal)   Pulse 87   Ht 5\' 4"  (1.626 m)   Wt (!) 346 lb 2 oz (157 kg)   LMP 07/17/2012   SpO2 95%   BMI 59.41 kg/m  , BMI Body mass index is 59.41 kg/m. GEN: Well nourished, well developed, in no acute distress  HEENT: normal  Neck: no JVD, carotid bruits, or masses Cardiac: Irregularly irregular; no murmurs, rubs, or gallops, mild edema  Respiratory:  clear to auscultation bilaterally, normal work of breathing GI: soft, nontender, nondistended, + BS MS: no deformity or atrophy  Skin: warm and dry, no rash Neuro:  Strength and sensation are intact Psych: euthymic mood, full  affect   EKG:  EKG is ordered today. The ekg ordered today demonstrates atrial fibrillation with ventricular rate of 87 bpm.   Recent Labs: 07/26/2021: ALT 6 04/21/2022: Platelets 196 04/28/2022: BUN 28; Creatinine, Ser 7.10; Hemoglobin 13.3; Potassium 3.8; Sodium 137    Lipid Panel    Component Value Date/Time   CHOL 122 02/09/2020  1340   TRIG 153 (H) 02/09/2020 1340   HDL 49 02/09/2020 1340   CHOLHDL 2.5 02/09/2020 1340   VLDL 31 02/09/2020 1340   LDLCALC 42 02/09/2020 1340      Wt Readings from Last 3 Encounters:  07/26/22 (!) 346 lb 2 oz (157 kg)  07/21/22 (!) 348 lb (157.9 kg)  06/27/22 (!) 345 lb (156.5 kg)            No data to display            ASSESSMENT AND PLAN:  1.  Permanent atrial fibrillation: Ventricular rate is reasonably controlled on Toprol 25 mg once daily.  Continue anticoagulation with Eliquis 5 mg twice daily.    2. Chronic diastolic heart failure: She appears to be euvolemic.  Fluid status is managed by dialysis.  3.  Essential hypertension: Blood pressure has been running low especially on days of dialysis.  I elected to decrease clonidine to 0.1 mg twice daily.  4. Morbid obesity: She is trying to lose weight but has not been successful.  5. History of pulmonary embolism: On anticoagulation with Eliquis.  6.  Obstructive sleep apnea: On CPAP  7.  End-stage renal disease on hemodialysis managed by nephrology    Disposition:   FU with me in 6 months  Signed,  Lorine Bears, MD  07/26/2022 8:50 AM    [facility]

## 2022-07-26 NOTE — Patient Instructions (Signed)
Medication Instructions:   Your physician has recommended you make the following change in your medication:   DECREASE Clonidine 0.1 mg tablet by mouth twice daily.  *If you need a refill on your cardiac medications before your next appointment, please call your pharmacy*   Lab Work:  No lab work ordered today.  If you have labs (blood work) drawn today and your tests are completely normal, you will receive your results only by: MyChart Message (if you have MyChart) OR A paper copy in the mail If you have any lab test that is abnormal or we need to change your treatment, we will call you to review the results.   Testing/Procedures:  No testing ordered today.   Follow-Up: At [facility], you and your health needs are our priority.  As part of our continuing mission to provide you with exceptional heart care, we have created designated Provider Care Teams.  These Care Teams include your primary Cardiologist (physician) and Advanced Practice Providers (APPs -  Physician Assistants and Nurse Practitioners) who all work together to provide you with the care you need, when you need it.  We recommend signing up for the patient portal called "MyChart".  Sign up information is provided on this After Visit Summary.  MyChart is used to connect with patients for Virtual Visits (Telemedicine).  Patients are able to view lab/test results, encounter notes, upcoming appointments, etc.  Non-urgent messages can be sent to your provider as well.   To learn more about what you can do with MyChart, go to [email or web address].    Your next appointment:   6 month(s)  Provider:   You may see Lorine Bears, MD or one of the following Advanced Practice Providers on your designated Care Team:   Nicolasa Ducking, NP Eula Listen, PA-C Cadence Fransico Michael, PA-C Charlsie Quest, NP

## 2022-07-31 ENCOUNTER — Other Ambulatory Visit: Payer: Self-pay

## 2022-08-19 DIAGNOSIS — E662 Morbid (severe) obesity with alveolar hypoventilation: Secondary | ICD-10-CM | POA: Diagnosis not present

## 2022-08-22 ENCOUNTER — Telehealth (INDEPENDENT_AMBULATORY_CARE_PROVIDER_SITE_OTHER): Payer: Self-pay | Admitting: Nurse Practitioner

## 2022-08-22 ENCOUNTER — Other Ambulatory Visit (INDEPENDENT_AMBULATORY_CARE_PROVIDER_SITE_OTHER): Payer: Self-pay | Admitting: Nurse Practitioner

## 2022-08-22 ENCOUNTER — Telehealth (INDEPENDENT_AMBULATORY_CARE_PROVIDER_SITE_OTHER): Payer: Self-pay

## 2022-08-22 DIAGNOSIS — N186 End stage renal disease: Secondary | ICD-10-CM

## 2022-08-22 NOTE — Telephone Encounter (Signed)
Patient called stating that she has an appt scheduled on 09/01/22 to check the blood flow of her arm before having having her dialysis port removed; but she received a call today scheduling to have access port removed 08/28/22. She wants to know what happens if she has her port removed and she's not able to use her arm for dialysis.   Called transferred to [place], NP

## 2022-08-22 NOTE — Telephone Encounter (Signed)
Patient called and LVM stating she needed NP Sheppard Plumber to call her back to talk to her about a procedure. No other details..     Please call and advise

## 2022-08-22 NOTE — Telephone Encounter (Signed)
Spoke with the patient and she is scheduled with Dr. Wyn Quaker for a permcath removal on 08/28/22 with a 2:00 pm arrival time to the [facility]. Pre-procedure instructions were discussed and will be sent to Mychart and mailed.

## 2022-08-22 NOTE — Telephone Encounter (Signed)
I discussed with patient, we will cancel permcath removal for 7/15 and we will make determination for removal once I have seen her on 07/19

## 2022-08-23 ENCOUNTER — Encounter: Payer: Self-pay | Admitting: Medical Oncology

## 2022-08-23 ENCOUNTER — Inpatient Hospital Stay: Payer: BC Managed Care – PPO | Attending: Medical Oncology | Admitting: Medical Oncology

## 2022-08-23 VITALS — BP 114/41 | HR 109 | Temp 99.1°F | Wt 344.0 lb

## 2022-08-23 DIAGNOSIS — G4733 Obstructive sleep apnea (adult) (pediatric): Secondary | ICD-10-CM | POA: Diagnosis not present

## 2022-08-23 DIAGNOSIS — I482 Chronic atrial fibrillation, unspecified: Secondary | ICD-10-CM | POA: Diagnosis not present

## 2022-08-23 DIAGNOSIS — Z992 Dependence on renal dialysis: Secondary | ICD-10-CM

## 2022-08-23 DIAGNOSIS — Z7901 Long term (current) use of anticoagulants: Secondary | ICD-10-CM | POA: Insufficient documentation

## 2022-08-23 DIAGNOSIS — L739 Follicular disorder, unspecified: Secondary | ICD-10-CM | POA: Insufficient documentation

## 2022-08-23 DIAGNOSIS — I5032 Chronic diastolic (congestive) heart failure: Secondary | ICD-10-CM | POA: Diagnosis not present

## 2022-08-23 DIAGNOSIS — Z86711 Personal history of pulmonary embolism: Secondary | ICD-10-CM | POA: Diagnosis not present

## 2022-08-23 DIAGNOSIS — N186 End stage renal disease: Secondary | ICD-10-CM

## 2022-08-23 DIAGNOSIS — Z853 Personal history of malignant neoplasm of breast: Secondary | ICD-10-CM | POA: Diagnosis not present

## 2022-08-23 DIAGNOSIS — J9601 Acute respiratory failure with hypoxia: Secondary | ICD-10-CM | POA: Diagnosis not present

## 2022-08-23 MED ORDER — DOXYCYCLINE HYCLATE 100 MG PO TABS: 100.0000 mg | ORAL_TABLET | Freq: Two times a day (BID) | ORAL | 0 refills | Status: AC

## 2022-08-23 NOTE — Telephone Encounter (Signed)
I contacted the patient to ask her about having her permcath removal on 08/23/22, patient was told that an opening was available. Patient declined stating she had an appt on that day and requested another date. Patient was offered 08/28/22, patient then stated she had an appt in our office on 09/01/22 regarding her access and should she keep that appt. I advised that she keep that appt and if she wanted to we could wait until after that appt to schedule her for a permcath removal. Patient stated to go ahead and schedule the procedure for 08/28/22. Patient was asked if she had an questions and patient stated she did not have any. Patient's appt for a permcath removal has been rescheduled and patient informed that it has been rescheduled to 09/04/22 and if it needed to be canceled or rescheduled after that it would be.

## 2022-08-23 NOTE — Progress Notes (Signed)
[facility]pt[facility] at [facility] [phone] [phone]  Patient Care Team: Alan Mulder, MD as PCP - General (Endocrinology) Iran Ouch, MD as PCP - Cardiology (Cardiology) Kieth Brightly, MD (General Surgery) Delma Freeze, FNP as Nurse Practitioner (Family Medicine) Mady Haagensen, MD as Consulting Physician (Nephrology) Creig Hines, MD as Consulting Physician (Hematology and Oncology)   Name of the patient: Nicole Schmidt  [id number]  [birth year]   Heme/Onc History: Patient is a 57 year old female with history of 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  She is s/p surgery adjuvant radiation treatment and was on tamoxifen until September 2016 when she developed submassive pulmonary embolism.  She was switched to Femara and she completed 5 years of hormone therapy in June 2020.  Patient has been Eliquis for her pulmonary embolism since then.  Hypercoagulable work-up was negative.   Current Treatment: Eliquis  Date of visit: 08/23/22  Reason for Consult: Nicole Schmidt is a 57 y.o. female who presents today for:  Lump: Patient reports that she shaved yesterday and noticed a lump of her left axilla. This was a big tender. She is concerned given her history of breast cancer. She reports no other lesions, recent illness, recent fever, unintentional weight loss or night sweats. She is UTD on her mammogram. Patient offers no further specific complaints today.    PAST MEDICAL HISTORY: Past Medical History:  Diagnosis Date   (HFpEF) heart failure with preserved ejection fraction (HCC)    a. 10/2018 Echo: EF 60-65%, diast dysfxn, RVSP 50.7 mmHg; b. 01/2020 Echo: EF 65-70%, G2DD; c. 01/2021 Echo: EF 60-65%, no rwma, mod LVH. Nl RV fxn, mildly enlarged RV. Mod BAE. Triv MR. d. 05/2021 Echo: EF 55-60; no RWMAs, mild LVH, mild TR.   Acquired hypothyroidism 02/09/2020   Adenoma of  left adrenal gland    Anemia    Aortic atherosclerosis (HCC)    Arthritis    Breast cancer of upper-inner quadrant of right female breast (HCC)    Right breast invasive CA and DCIS , 7 mm T1,N0,M0. Er/PR pos, her 2 negative.  Margins 1 mm.   COPD (chronic obstructive pulmonary disease) (HCC)    Diabetes mellitus type 2 in obese 02/09/2020   Difficult intubation 09/28/2021   a.) mallampati III-IV; BMI >55 kg/m   Diverticulosis    Dyslipidemia 02/09/2020   ESBL (extended spectrum beta-lactamase) producing bacteria infection 03/20/2021   a. urine culture (+) for ESBL producing E.coli and P. mirabilis   ESRD (end stage renal disease) on dialysis (HCC)    a. T-Th-Sat HD initiated 02/2021   Essential hypertension    Gout    History of kidney stones    Long term current use of anticoagulant    a.) apixaban   Menopause    age 74   Morbid obesity (HCC)    OSA treated with BiPAP    a. on nocturnal PAP therapy; Trilogy 100 ventilator   Permanent atrial fibrillation (HCC)    a. Dx 10/2018 in setting of PNA. b. CHA2DS2-VASc = 7 (sex, HFpEF, HTN, PE x2, aortic plaque, T2DM); c. s/p successful DCCV 11/2018. d. subsequent recurrent Afib; rate/rhythm maintained on oral metoprolol succinate; chronically anticoagulated using standard dose apixaban.   Personal history of radiation therapy    Pulmonary embolism (HCC) 10/2014   a. on chronic apixaban   Restrictive lung disease    Shingles 10/24/2021   Sleep difficulties  a.) takes melatonin   Thyroid goiter     PAST SURGICAL HISTORY:  Past Surgical History:  Procedure Laterality Date   A/V FISTULAGRAM Left 09/02/2021   Procedure: A/V Fistulagram;  Surgeon: Renford Dills, MD;  Location: [facility];  Service: Cardiovascular;  Laterality: Left;   A/V FISTULAGRAM Left 06/27/2022   Procedure: A/V Fistulagram;  Surgeon: Renford Dills, MD;  Location: [facility];  Service: Cardiovascular;  Laterality: Left;   AV FISTULA  PLACEMENT Left 07/22/2021   Procedure: ARTERIOVENOUS (AV) FISTULA CREATION (RADIALCEPHALIC);  Surgeon: Renford Dills, MD;  Location: [facility];  Service: Vascular;  Laterality: Left;   AV FISTULA PLACEMENT Left 12/09/2021   Procedure: ARTERIOVENOUS (AV) FISTULA CREATION (BRACHIAL CEPHALIC);  Surgeon: Renford Dills, MD;  Location: [facility];  Service: Vascular;  Laterality: Left;   BREAST BIOPSY Right 2014   breast ca   BREAST EXCISIONAL BIOPSY Right 07/19/2012   breast ca   BREAST SURGERY Right 2014   with sentinel node bx subareaolar duct excision   CARDIOVERSION N/A 11/28/2018   Procedure: CARDIOVERSION;  Surgeon: Antonieta Iba, MD;  Location: [facility];  Service: Cardiovascular;  Laterality: N/A;   COLONOSCOPY WITH PROPOFOL N/A 01/30/2017   Procedure: COLONOSCOPY WITH PROPOFOL;  Surgeon: Kieth Brightly, MD;  Location: [facility];  Service: Endoscopy;  Laterality: N/A;   CYSTOSCOPY W/ RETROGRADES  06/17/2021   Procedure: CYSTOSCOPY WITH RETROGRADE PYELOGRAM;  Surgeon: Sondra Come, MD;  Location: [facility];  Service: Urology;;   CYSTOSCOPY W/ URETERAL STENT PLACEMENT Left 02/14/2021   Procedure: CYSTOSCOPY WITH RETROGRADE PYELOGRAM/URETERAL STENT PLACEMENT;  Surgeon: Sondra Come, MD;  Location: [facility];  Service: Urology;  Laterality: Left;   CYSTOSCOPY/URETEROSCOPY/HOLMIUM LASER/STENT PLACEMENT Left 06/17/2021   Procedure: CYSTOSCOPY/URETEROSCOPY/HOLMIUM LASER/STENT EXCHANGE;  Surgeon: Sondra Come, MD;  Location: [facility];  Service: Urology;  Laterality: Left;   DIALYSIS/PERMA CATHETER INSERTION N/A 02/24/2021   Procedure: DIALYSIS/PERMA CATHETER INSERTION;  Surgeon: Annice Needy, MD;  Location: [facility];  Service: Cardiovascular;  Laterality: N/A;   DIALYSIS/PERMA CATHETER INSERTION N/A 12/16/2021   Procedure: DIALYSIS/PERMA CATHETER INSERTION;  Surgeon: Renford Dills, MD;  Location: [facility];  Service: Cardiovascular;  Laterality:  N/A;   PERIPHERAL VASCULAR THROMBECTOMY Left 09/28/2021   Procedure: PERIPHERAL VASCULAR THROMBECTOMY;  Surgeon: Renford Dills, MD;  Location: [facility];  Service: Cardiovascular;  Laterality: Left;   REVISON OF ARTERIOVENOUS FISTULA Left 04/28/2022   Procedure: REVISON OF ARTERIOVENOUS FISTULA (BRACHIAL CEPHALIC);  Surgeon: Renford Dills, MD;  Location: [facility];  Service: Vascular;  Laterality: Left;    HEMATOLOGY/ONCOLOGY HISTORY:  Oncology History   No history exists.    ALLERGIES:  is allergic to spironolactone.  MEDICATIONS:  Current Outpatient Medications  Medication Sig Dispense Refill   apixaban (ELIQUIS) 5 MG TABS tablet Take 1 tablet (5 mg total) by mouth 2 (two) times daily. 180 tablet 1   atorvastatin (LIPITOR) 40 MG tablet Take 40 mg by mouth daily.     Cholecalciferol (VITAMIN D3) 1.25 MG (50000 UT) CAPS Take 1 capsule by mouth once a week.     cloNIDine (CATAPRES) 0.1 MG tablet Take 1 tablet (0.1 mg total) by mouth 2 (two) times daily. 180 tablet 2   CRANBERRY PO Take 1 capsule by mouth daily.     doxycycline (VIBRA-TABS) 100 MG tablet Take 1 tablet (100 mg total) by mouth 2 (two) times daily for 7 days. 14 tablet  0   ergocalciferol (VITAMIN D2) 1.25 MG (50000 UT) capsule Take 1 capsule (50,000 Units total) by mouth once a week. 5 capsule 0   ezetimibe (ZETIA) 10 MG tablet Take 10 mg by mouth daily with supper.     ferric gluconate 250 mg in sodium chloride 0.9 % 250 mL Inject 250 mg into the vein Every Tuesday,Thursday,and Saturday with dialysis.     folic acid (FOLVITE) 1 MG tablet Take 1 tablet (1 mg total) by mouth daily. 30 tablet 0   gabapentin (NEURONTIN) 300 MG capsule Take 1 capsule (300 mg total) by mouth at bedtime. 30 capsule 0   HYDROcodone-acetaminophen (NORCO) 5-325 MG tablet Take 1 tablet by mouth every 6 (six) hours as needed for moderate pain. 30 tablet 0   Lancets (ONETOUCH DELICA PLUS LANCET30G) MISC      levothyroxine (SYNTHROID)  112 MCG tablet Take 1 tablet (112 mcg total) by mouth daily before breakfast. 30 tablet 0   melatonin 5 MG TABS Take 2 tablets (10 mg total) by mouth at bedtime as needed (sleep). 60 tablet 0   metoprolol succinate (TOPROL-XL) 25 MG 24 hr tablet Take 1 tablet (25 mg total) by mouth at bedtime. Take with or immediately following a meal. 90 tablet 3   midodrine (PROAMATINE) 10 MG tablet Take 1 tablet (10 mg total) by mouth Every Tuesday,Thursday,and Saturday with dialysis. 30 tablet 0   ONETOUCH ULTRA test strip      polyethylene glycol powder (GLYCOLAX/MIRALAX) 17 GM/SCOOP powder Take 1 Container by mouth once.     traMADol (ULTRAM) 50 MG tablet Take 1 tablet (50 mg total) by mouth every 12 (twelve) hours as needed for moderate pain. 30 tablet 0   TRULICITY 1.5 MG/0.5ML SOPN Inject 1.5 mg into the skin every Monday.     VELPHORO 500 MG chewable tablet Chew 1,000 mg by mouth 3 (three) times daily.     acetaminophen (TYLENOL) 325 MG tablet Take 2 tablets (650 mg total) by mouth every 6 (six) hours as needed for mild pain (or Fever >/= 101). (Patient not taking: Reported on 08/23/2022)     albuterol (VENTOLIN HFA) 108 (90 Base) MCG/ACT inhaler Inhale 2 puffs into the lungs every 6 (six) hours as needed for wheezing or shortness of breath. (Patient not taking: Reported on 08/23/2022) 8.5 g 0   No current facility-administered medications for this visit.    VITAL SIGNS: BP (!) 114/41 (BP Location: Right Arm, Patient Position: Sitting)   Pulse (!) 109   Temp 99.1 F (37.3 C) (Tympanic)   Wt (!) 344 lb (156 kg)   LMP 07/17/2012   SpO2 97%   BMI 59.05 kg/m  Filed Weights   08/23/22 1029  Weight: (!) 344 lb (156 kg)    Estimated body mass index is 59.05 kg/m as calculated from the following:   Height as of 07/26/22: 5\' 4"  (1.626 m).   Weight as of this encounter: 344 lb (156 kg).  LABS: CBC:    Component Value Date/Time   WBC 8.4 04/21/2022 1109   HGB 13.3 04/28/2022 0639   HGB 9.9 (L)  02/27/2020 0931   HCT 39.0 04/28/2022 0639   HCT 32.2 (L) 02/27/2020 0931   PLT 196 04/21/2022 1109   PLT 232 02/27/2020 0931   MCV 110.5 (H) 04/21/2022 1109   MCV 89 02/27/2020 0931   MCV 90 03/09/2014 1533   NEUTROABS 5.5 04/21/2022 1109   NEUTROABS 7.7 (H) 03/09/2014 1533   LYMPHSABS 1.8 04/21/2022 1109  LYMPHSABS 1.5 03/09/2014 1533   MONOABS 0.8 04/21/2022 1109   MONOABS 0.6 03/09/2014 1533   EOSABS 0.2 04/21/2022 1109   EOSABS 0.2 03/09/2014 1533   BASOSABS 0.0 04/21/2022 1109   BASOSABS 0.1 03/09/2014 1533   Comprehensive Metabolic Panel:    Component Value Date/Time   NA 137 04/28/2022 0639   NA 144 02/27/2020 0931   NA 141 03/09/2014 1533   K 3.8 04/28/2022 0639   K 4.2 03/09/2014 1533   CL 97 (L) 04/28/2022 0639   CL 101 03/09/2014 1533   CO2 29 04/21/2022 1109   CO2 30 03/09/2014 1533   BUN 28 (H) 04/28/2022 0639   BUN 40 (H) 02/27/2020 0931   BUN 35 (H) 03/09/2014 1533   CREATININE 7.10 (H) 04/28/2022 0639   CREATININE 1.50 (H) 03/09/2014 1533   GLUCOSE 131 (H) 04/28/2022 0639   GLUCOSE 95 03/09/2014 1533   CALCIUM 8.4 (L) 04/21/2022 1109   CALCIUM 8.8 03/09/2014 1533   AST 17 07/26/2021 2222   AST 16 03/09/2014 1533   ALT 6 07/26/2021 2222   ALT 23 03/09/2014 1533   ALKPHOS 162 (H) 07/26/2021 2222   ALKPHOS 113 03/09/2014 1533   BILITOT 0.8 07/26/2021 2222   BILITOT 0.3 03/09/2014 1533   PROT 7.5 07/26/2021 2222   PROT 7.3 03/09/2014 1533   ALBUMIN 3.6 07/26/2021 2222   ALBUMIN 3.5 03/09/2014 1533    RADIOGRAPHIC STUDIES: No results found.  PERFORMANCE STATUS (ECOG) : 1 - Symptomatic but completely ambulatory  Review of Systems Unless otherwise noted, a complete review of systems is negative.  Physical Exam General: NAD Cardiovascular: regular rate and rhythm Lymph: No palpable lymphadenopathy  Pulmonary: clear ant fields Abdomen: soft, nontender, + bowel sounds GU: no suprapubic tenderness Extremities: no edema, no joint  deformities Skin: See below. Superficial folliculitis with erythema and visible pustules of the left axilla.    Assessment and Plan- Patient is a 57 y.o. female    Encounter Diagnoses  Name Primary?   Folliculitis Yes   End stage renal disease on dialysis [facility])    History of breast cancer     New. I discussed with patient that the area of concern is likely secondary to an ingrown hair from recent shaving. It is superficial in nature with no palpable lymphadenopathy or sign of breast cancer recurrence. Avoidance of shaving, warm compress, and doxycyline advised. Reviewed red flags.    Patient expressed understanding and was in agreement with this plan. She also understands that She can call clinic at any time with any questions, concerns, or complaints.   Thank you for allowing me to participate in the care of this very pleasant patient.   Time Total: 25  Visit consisted of counseling and education dealing with the complex and emotionally intense issues of symptom management in the setting of serious illness.Greater than 50%  of this time was spent counseling and coordinating care related to the above assessment and plan.  Signed by: Clent Jacks, PA-C

## 2022-08-24 NOTE — Telephone Encounter (Signed)
Patient has been rescheduled to 09/04/22 for a permcath removal from 08/28/22.

## 2022-08-28 DIAGNOSIS — N186 End stage renal disease: Secondary | ICD-10-CM

## 2022-09-01 ENCOUNTER — Ambulatory Visit (INDEPENDENT_AMBULATORY_CARE_PROVIDER_SITE_OTHER): Payer: BC Managed Care – PPO | Admitting: Nurse Practitioner

## 2022-09-01 ENCOUNTER — Ambulatory Visit (INDEPENDENT_AMBULATORY_CARE_PROVIDER_SITE_OTHER): Payer: BC Managed Care – PPO

## 2022-09-01 DIAGNOSIS — I10 Essential (primary) hypertension: Secondary | ICD-10-CM | POA: Diagnosis not present

## 2022-09-01 DIAGNOSIS — E1165 Type 2 diabetes mellitus with hyperglycemia: Secondary | ICD-10-CM

## 2022-09-01 DIAGNOSIS — N186 End stage renal disease: Secondary | ICD-10-CM

## 2022-09-01 DIAGNOSIS — E785 Hyperlipidemia, unspecified: Secondary | ICD-10-CM | POA: Diagnosis not present

## 2022-09-02 ENCOUNTER — Encounter (INDEPENDENT_AMBULATORY_CARE_PROVIDER_SITE_OTHER): Payer: Self-pay | Admitting: Nurse Practitioner

## 2022-09-02 NOTE — H&P (View-Only) (Signed)
Subjective:    Patient ID: Nicole Schmidt, female    DOB: 20-Jan-1966, 57 y.o.   MRN: [id number] Chief Complaint  Patient presents with   Venous Insufficiency    The patient returns to the office for followup status post intervention of their dialysis access a left brachiocephalic AV fistula.     Following the intervention the access function has significantly improved, with better flow rates and improved KT/V.  The patient had an incident of significant bleeding but she notes that the bleeding has been because she realized it was ongoing and was able to quickly have stopped once pressure was held.     The patient denies amaurosis fugax or recent TIA symptoms. There are no recent neurological changes noted. There is no history of DVT, PE or superficial thrombophlebitis. No recent episodes of angina or shortness of breath documented.    Duplex ultrasound of the AV access shows a patent access.  The previously noted stenosis is improved compared to last study.  However, there is a noted stenosis at the confluence today.  However the velocities at that area are actually improving compared to previous visit.  Flow volume today is 1401 cc/min (previous flow volume was 1358 cc/min)     Review of Systems  Musculoskeletal:  Positive for arthralgias.  Hematological:  Does not bruise/bleed easily.  All other systems reviewed and are negative.      Objective:   Physical Exam Vitals reviewed.  HENT:     Head: Normocephalic.  Cardiovascular:     Rate and Rhythm: Normal rate.     Pulses:          Radial pulses are 2+ on the left side.     Arteriovenous access: Left arteriovenous access is present.    Comments: Good thrill and bruit Pulmonary:     Effort: Pulmonary effort is normal.  Skin:    General: Skin is warm and dry.  Neurological:     Mental Status: She is alert and oriented to person, place, and time.  Psychiatric:        Mood and Affect: Mood normal.        Behavior:  Behavior normal.        Thought Content: Thought content normal.        Judgment: Judgment normal.     BP 104/64 (BP Location: Right Arm)   Pulse 98   Resp 18   Ht 5\' 4"  (1.626 m)   Wt (!) 342 lb 3.2 oz (155.2 kg)   LMP 07/17/2012   BMI 58.74 kg/m   Past Medical History:  Diagnosis Date   (HFpEF) heart failure with preserved ejection fraction (HCC)    a. 10/2018 Echo: EF 60-65%, diast dysfxn, RVSP 50.7 mmHg; b. 01/2020 Echo: EF 65-70%, G2DD; c. 01/2021 Echo: EF 60-65%, no rwma, mod LVH. Nl RV fxn, mildly enlarged RV. Mod BAE. Triv MR. d. 05/2021 Echo: EF 55-60; no RWMAs, mild LVH, mild TR.   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 Er/PR pos, her 2 negative.  Margins 1 mm.   COPD (chronic obstructive pulmonary disease) (HCC)    Diabetes mellitus type 2 in obese 02/09/2020   Difficult intubation 09/28/2021   a.) mallampati III-IV; BMI >55 kg/m  Diverticulosis    Dyslipidemia 02/09/2020   ESBL (extended spectrum beta-lactamase) producing bacteria infection 03/20/2021   a. urine culture (+) for ESBL producing E.coli and P. mirabilis   ESRD (end stage renal disease) on dialysis (HCC)    a. T-Th-Sat HD initiated 02/2021   Essential hypertension    Gout    History of kidney stones    Long term current use of anticoagulant    a.) apixaban   Menopause    age 89   Morbid obesity (HCC)    OSA treated with BiPAP    a. on nocturnal PAP therapy; Trilogy 100 ventilator   Permanent atrial fibrillation (HCC)    a. Dx 10/2018 in setting of PNA. b. CHA2DS2-VASc = 7 (sex, HFpEF, HTN, PE x2, aortic plaque, T2DM); c. s/p successful DCCV 11/2018. d. subsequent recurrent Afib; rate/rhythm maintained on oral metoprolol succinate; chronically anticoagulated using standard dose apixaban.    Personal history of radiation therapy    Pulmonary embolism (HCC) 10/2014   a. on chronic apixaban   Restrictive lung disease    Shingles 10/24/2021   Sleep difficulties    a.) takes melatonin   Thyroid goiter     Social History   Socioeconomic History   Marital status: Single    Spouse name: Not on file   Number of children: Not on file   Years of education: Not on file   Highest education level: Not on file  Occupational History   Occupation: retired  Tobacco Use   Smoking status: Never    Passive exposure: Never   Smokeless tobacco: Never  Vaping Use   Vaping status: Never Used  Substance and Sexual Activity   Alcohol use: No    Alcohol/week: 0.0 standard drinks of alcohol   Drug use: No   Sexual activity: Not on file  Other Topics Concern   Not on file  Social History Narrative   Lives in [place] by herself and retired.    Social Determinants of Health   Financial Resource Strain: Unknown (11/15/2017)   Overall Financial Resource Strain (CARDIA)    Difficulty of Paying Living Expenses: Patient declined  Food Insecurity: Unknown (11/15/2017)   Hunger Vital Sign    Worried About Running Out of Food in the Last Year: Patient declined    Ran Out of Food in the Last Year: Patient declined  Transportation Needs: Unknown (11/15/2017)   PRAPARE - Administrator, Civil Service (Medical): Patient declined    Lack of Transportation (Non-Medical): Patient declined  Physical Activity: Unknown (11/15/2017)   Exercise Vital Sign    Days of Exercise per Week: Patient declined    Minutes of Exercise per Session: Patient declined  Stress: Unknown (11/15/2017)   [employer] of Occupational Health - Occupational Stress Questionnaire    Feeling of Stress : Patient declined  Social Connections: Unknown (11/15/2017)   Social Connection and Isolation Panel [NHANES]    Frequency of Communication with Friends and Family: Patient declined    Frequency of Social  Gatherings with Friends and Family: Patient declined    Attends Religious Services: Patient declined    Database administrator or Organizations: Patient declined    Attends Banker Meetings: Patient declined    Marital Status: Patient declined  Intimate Partner Violence: Unknown (11/15/2017)   Humiliation, Afraid, Rape, and Kick questionnaire    Fear of Current or Ex-Partner: Patient declined    Emotionally Abused: Patient declined    Physically Abused: Patient declined  Sexually Abused: Patient declined    Past Surgical History:  Procedure Laterality Date   A/V FISTULAGRAM Left 09/02/2021   Procedure: A/V Fistulagram;  Surgeon: Renford Dills, MD;  Location: [facility];  Service: Cardiovascular;  Laterality: Left;   A/V FISTULAGRAM Left 06/27/2022   Procedure: A/V Fistulagram;  Surgeon: Renford Dills, MD;  Location: [facility];  Service: Cardiovascular;  Laterality: Left;   AV FISTULA PLACEMENT Left 07/22/2021   Procedure: ARTERIOVENOUS (AV) FISTULA CREATION (RADIALCEPHALIC);  Surgeon: Renford Dills, MD;  Location: [facility];  Service: Vascular;  Laterality: Left;   AV FISTULA PLACEMENT Left 12/09/2021   Procedure: ARTERIOVENOUS (AV) FISTULA CREATION (BRACHIAL CEPHALIC);  Surgeon: Renford Dills, MD;  Location: [facility];  Service: Vascular;  Laterality: Left;   BREAST BIOPSY Right 2014   breast ca   BREAST EXCISIONAL BIOPSY Right 07/19/2012   breast ca   BREAST SURGERY Right 2014   with sentinel node bx subareaolar duct excision   CARDIOVERSION N/A 11/28/2018   Procedure: CARDIOVERSION;  Surgeon: Antonieta Iba, MD;  Location: [facility];  Service: Cardiovascular;  Laterality: N/A;   COLONOSCOPY WITH PROPOFOL N/A 01/30/2017   Procedure: COLONOSCOPY WITH PROPOFOL;  Surgeon: Kieth Brightly, MD;  Location: [facility];  Service: Endoscopy;  Laterality: N/A;   CYSTOSCOPY W/ RETROGRADES  06/17/2021   Procedure: CYSTOSCOPY WITH  RETROGRADE PYELOGRAM;  Surgeon: Sondra Come, MD;  Location: [facility];  Service: Urology;;   CYSTOSCOPY W/ URETERAL STENT PLACEMENT Left 02/14/2021   Procedure: CYSTOSCOPY WITH RETROGRADE PYELOGRAM/URETERAL STENT PLACEMENT;  Surgeon: Sondra Come, MD;  Location: [facility];  Service: Urology;  Laterality: Left;   CYSTOSCOPY/URETEROSCOPY/HOLMIUM LASER/STENT PLACEMENT Left 06/17/2021   Procedure: CYSTOSCOPY/URETEROSCOPY/HOLMIUM LASER/STENT EXCHANGE;  Surgeon: Sondra Come, MD;  Location: [facility];  Service: Urology;  Laterality: Left;   DIALYSIS/PERMA CATHETER INSERTION N/A 02/24/2021   Procedure: DIALYSIS/PERMA CATHETER INSERTION;  Surgeon: Annice Needy, MD;  Location: [facility];  Service: Cardiovascular;  Laterality: N/A;   DIALYSIS/PERMA CATHETER INSERTION N/A 12/16/2021   Procedure: DIALYSIS/PERMA CATHETER INSERTION;  Surgeon: Renford Dills, MD;  Location: [facility];  Service: Cardiovascular;  Laterality: N/A;   PERIPHERAL VASCULAR THROMBECTOMY Left 09/28/2021   Procedure: PERIPHERAL VASCULAR THROMBECTOMY;  Surgeon: Renford Dills, MD;  Location: [facility];  Service: Cardiovascular;  Laterality: Left;   REVISON OF ARTERIOVENOUS FISTULA Left 04/28/2022   Procedure: REVISON OF ARTERIOVENOUS FISTULA (BRACHIAL CEPHALIC);  Surgeon: Renford Dills, MD;  Location: [facility];  Service: Vascular;  Laterality: Left;    Family History  Problem Relation Age of Onset   Diabetes Father    Hypertension Father    Parkinson's disease Father    Hypertension Mother    Rheum arthritis Mother    Atrial fibrillation Mother    Heart failure Mother    Heart attack Mother        died in her mid-70's.   Colon cancer Cousin 54   Breast cancer Neg Hx     Allergies  Allergen Reactions   Spironolactone     Hyperkalemia  Pt unaware of this allergy       Latest Ref Rng & Units 04/28/2022    6:39 AM 04/21/2022   11:09 AM 12/16/2021    8:30 AM  CBC  WBC 4.0 -  10.5 K/uL  8.4  9.1   Hemoglobin 12.0 - 15.0 g/dL 95.6  21.3  08.6   Hematocrit 36.0 -  46.0 % 39.0  40.0  34.7   Platelets 150 - 400 K/uL  196  227       CMP     Component Value Date/Time   NA 137 04/28/2022 0639   NA 144 02/27/2020 0931   NA 141 03/09/2014 1533   K 3.8 04/28/2022 0639   K 4.2 03/09/2014 1533   CL 97 (L) 04/28/2022 0639   CL 101 03/09/2014 1533   CO2 29 04/21/2022 1109   CO2 30 03/09/2014 1533   GLUCOSE 131 (H) 04/28/2022 0639   GLUCOSE 95 03/09/2014 1533   BUN 28 (H) 04/28/2022 0639   BUN 40 (H) 02/27/2020 0931   BUN 35 (H) 03/09/2014 1533   CREATININE 7.10 (H) 04/28/2022 0639   CREATININE 1.50 (H) 03/09/2014 1533   CALCIUM 8.4 (L) 04/21/2022 1109   CALCIUM 8.8 03/09/2014 1533   PROT 7.5 07/26/2021 2222   PROT 7.3 03/09/2014 1533   ALBUMIN 3.6 07/26/2021 2222   ALBUMIN 3.5 03/09/2014 1533   AST 17 07/26/2021 2222   AST 16 03/09/2014 1533   ALT 6 07/26/2021 2222   ALT 23 03/09/2014 1533   ALKPHOS 162 (H) 07/26/2021 2222   ALKPHOS 113 03/09/2014 1533   BILITOT 0.8 07/26/2021 2222   BILITOT 0.3 03/09/2014 1533   GFRNONAA 6 (L) 04/21/2022 1109   GFRNONAA 39 (L) 03/09/2014 1533   GFRNONAA 55 (L) 07/31/2013 1521     No results found.     Assessment & Plan:   1. ESRD (end stage renal disease) (HCC) I had a long discussion with the patient regarding fistulas and progression with PermCath placement.  The patient is understandably apprehensive about having her PermCath removed due to the fact that if there are issues she may not have any ability to dialyze.  I discussed the natural progression with fistulas and grafts and that unfortunately is a side effect of dialysis sometimes there are issues with grafts which cannot quite be helped.  However in cases where the dialysis access is clotted we will try to declot the access systems possible unfortunately we are unable to protect when access may fail.  Based on her studies today she does have a elevated  velocities at the confluence but this is actually improved since her last studies.  She has not had any issues during dialysis.  And given the fact that her PermCath is actually having issues I think at this time removing it would be in her best interest to avoid any long-term residual issues such as infection or any damage to the central vessels.  Following this discussion the patient is agreeable to move forward with PermCath removal.  Maintain a close eye and have her return in 3 months with an HDA  2. Essential hypertension Continue antihypertensive medications as already ordered, these medications have been reviewed and there are no changes at this time.  3. Dyslipidemia Continue statin as ordered and reviewed, no changes at this time  4. Controlled type 2 diabetes mellitus with hyperglycemia, without long-term current use of insulin (HCC) Continue hypoglycemic medications as already ordered, these medications have been reviewed and there are no changes at this time.  Hgb A1C to be monitored as already arranged by primary service   Current Outpatient Medications on File Prior to Visit  Medication Sig Dispense Refill   apixaban (ELIQUIS) 5 MG TABS tablet Take 1 tablet (5 mg total) by mouth 2 (two) times daily. 180 tablet 1   atorvastatin (LIPITOR) 40 MG tablet  Take 40 mg by mouth daily.     Cholecalciferol (VITAMIN D3) 1.25 MG (50000 UT) CAPS Take 1 capsule by mouth once a week.     cloNIDine (CATAPRES) 0.1 MG tablet Take 1 tablet (0.1 mg total) by mouth 2 (two) times daily. 180 tablet 2   CRANBERRY PO Take 1 capsule by mouth daily.     ergocalciferol (VITAMIN D2) 1.25 MG (50000 UT) capsule Take 1 capsule (50,000 Units total) by mouth once a week. 5 capsule 0   ezetimibe (ZETIA) 10 MG tablet Take 10 mg by mouth daily with supper.     ferric gluconate 250 mg in sodium chloride 0.9 % 250 mL Inject 250 mg into the vein Every Tuesday,Thursday,and Saturday with dialysis.     folic acid  (FOLVITE) 1 MG tablet Take 1 tablet (1 mg total) by mouth daily. 30 tablet 0   gabapentin (NEURONTIN) 300 MG capsule Take 1 capsule (300 mg total) by mouth at bedtime. 30 capsule 0   HYDROcodone-acetaminophen (NORCO) 5-325 MG tablet Take 1 tablet by mouth every 6 (six) hours as needed for moderate pain. 30 tablet 0   Lancets (ONETOUCH DELICA PLUS LANCET30G) MISC      levothyroxine (SYNTHROID) 112 MCG tablet Take 1 tablet (112 mcg total) by mouth daily before breakfast. 30 tablet 0   melatonin 5 MG TABS Take 2 tablets (10 mg total) by mouth at bedtime as needed (sleep). 60 tablet 0   metoprolol succinate (TOPROL-XL) 25 MG 24 hr tablet Take 1 tablet (25 mg total) by mouth at bedtime. Take with or immediately following a meal. 90 tablet 3   midodrine (PROAMATINE) 10 MG tablet Take 1 tablet (10 mg total) by mouth Every Tuesday,Thursday,and Saturday with dialysis. 30 tablet 0   ONETOUCH ULTRA test strip      polyethylene glycol powder (GLYCOLAX/MIRALAX) 17 GM/SCOOP powder Take 1 Container by mouth once.     traMADol (ULTRAM) 50 MG tablet Take 1 tablet (50 mg total) by mouth every 12 (twelve) hours as needed for moderate pain. 30 tablet 0   TRULICITY 1.5 MG/0.5ML SOPN Inject 1.5 mg into the skin every Monday.     VELPHORO 500 MG chewable tablet Chew 1,000 mg by mouth 3 (three) times daily.     acetaminophen (TYLENOL) 325 MG tablet Take 2 tablets (650 mg total) by mouth every 6 (six) hours as needed for mild pain (or Fever >/= 101). (Patient not taking: Reported on 08/23/2022)     albuterol (VENTOLIN HFA) 108 (90 Base) MCG/ACT inhaler Inhale 2 puffs into the lungs every 6 (six) hours as needed for wheezing or shortness of breath. (Patient not taking: Reported on 08/23/2022) 8.5 g 0   No current facility-administered medications on file prior to visit.    There are no Patient Instructions on file for this visit. No follow-ups on file.   Georgiana Spinner, NP

## 2022-09-02 NOTE — Progress Notes (Signed)
Subjective:    Patient ID: Nicole Schmidt, female    DOB: 20-Jan-1966, 57 y.o.   MRN: [id number] Chief Complaint  Patient presents with   Venous Insufficiency    The patient returns to the office for followup status post intervention of their dialysis access a left brachiocephalic AV fistula.     Following the intervention the access function has significantly improved, with better flow rates and improved KT/V.  The patient had an incident of significant bleeding but she notes that the bleeding has been because she realized it was ongoing and was able to quickly have stopped once pressure was held.     The patient denies amaurosis fugax or recent TIA symptoms. There are no recent neurological changes noted. There is no history of DVT, PE or superficial thrombophlebitis. No recent episodes of angina or shortness of breath documented.    Duplex ultrasound of the AV access shows a patent access.  The previously noted stenosis is improved compared to last study.  However, there is a noted stenosis at the confluence today.  However the velocities at that area are actually improving compared to previous visit.  Flow volume today is 1401 cc/min (previous flow volume was 1358 cc/min)     Review of Systems  Musculoskeletal:  Positive for arthralgias.  Hematological:  Does not bruise/bleed easily.  All other systems reviewed and are negative.      Objective:   Physical Exam Vitals reviewed.  HENT:     Head: Normocephalic.  Cardiovascular:     Rate and Rhythm: Normal rate.     Pulses:          Radial pulses are 2+ on the left side.     Arteriovenous access: Left arteriovenous access is present.    Comments: Good thrill and bruit Pulmonary:     Effort: Pulmonary effort is normal.  Skin:    General: Skin is warm and dry.  Neurological:     Mental Status: She is alert and oriented to person, place, and time.  Psychiatric:        Mood and Affect: Mood normal.        Behavior:  Behavior normal.        Thought Content: Thought content normal.        Judgment: Judgment normal.     BP 104/64 (BP Location: Right Arm)   Pulse 98   Resp 18   Ht 5\' 4"  (1.626 m)   Wt (!) 342 lb 3.2 oz (155.2 kg)   LMP 07/17/2012   BMI 58.74 kg/m   Past Medical History:  Diagnosis Date   (HFpEF) heart failure with preserved ejection fraction (HCC)    a. 10/2018 Echo: EF 60-65%, diast dysfxn, RVSP 50.7 mmHg; b. 01/2020 Echo: EF 65-70%, G2DD; c. 01/2021 Echo: EF 60-65%, no rwma, mod LVH. Nl RV fxn, mildly enlarged RV. Mod BAE. Triv MR. d. 05/2021 Echo: EF 55-60; no RWMAs, mild LVH, mild TR.   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 Er/PR pos, her 2 negative.  Margins 1 mm.   COPD (chronic obstructive pulmonary disease) (HCC)    Diabetes mellitus type 2 in obese 02/09/2020   Difficult intubation 09/28/2021   a.) mallampati III-IV; BMI >55 kg/m  Diverticulosis    Dyslipidemia 02/09/2020   ESBL (extended spectrum beta-lactamase) producing bacteria infection 03/20/2021   a. urine culture (+) for ESBL producing E.coli and P. mirabilis   ESRD (end stage renal disease) on dialysis (HCC)    a. T-Th-Sat HD initiated 02/2021   Essential hypertension    Gout    History of kidney stones    Long term current use of anticoagulant    a.) apixaban   Menopause    age 89   Morbid obesity (HCC)    OSA treated with BiPAP    a. on nocturnal PAP therapy; Trilogy 100 ventilator   Permanent atrial fibrillation (HCC)    a. Dx 10/2018 in setting of PNA. b. CHA2DS2-VASc = 7 (sex, HFpEF, HTN, PE x2, aortic plaque, T2DM); c. s/p successful DCCV 11/2018. d. subsequent recurrent Afib; rate/rhythm maintained on oral metoprolol succinate; chronically anticoagulated using standard dose apixaban.    Personal history of radiation therapy    Pulmonary embolism (HCC) 10/2014   a. on chronic apixaban   Restrictive lung disease    Shingles 10/24/2021   Sleep difficulties    a.) takes melatonin   Thyroid goiter     Social History   Socioeconomic History   Marital status: Single    Spouse name: Not on file   Number of children: Not on file   Years of education: Not on file   Highest education level: Not on file  Occupational History   Occupation: retired  Tobacco Use   Smoking status: Never    Passive exposure: Never   Smokeless tobacco: Never  Vaping Use   Vaping status: Never Used  Substance and Sexual Activity   Alcohol use: No    Alcohol/week: 0.0 standard drinks of alcohol   Drug use: No   Sexual activity: Not on file  Other Topics Concern   Not on file  Social History Narrative   Lives in [place] by herself and retired.    Social Determinants of Health   Financial Resource Strain: Unknown (11/15/2017)   Overall Financial Resource Strain (CARDIA)    Difficulty of Paying Living Expenses: Patient declined  Food Insecurity: Unknown (11/15/2017)   Hunger Vital Sign    Worried About Running Out of Food in the Last Year: Patient declined    Ran Out of Food in the Last Year: Patient declined  Transportation Needs: Unknown (11/15/2017)   PRAPARE - Administrator, Civil Service (Medical): Patient declined    Lack of Transportation (Non-Medical): Patient declined  Physical Activity: Unknown (11/15/2017)   Exercise Vital Sign    Days of Exercise per Week: Patient declined    Minutes of Exercise per Session: Patient declined  Stress: Unknown (11/15/2017)   [employer] of Occupational Health - Occupational Stress Questionnaire    Feeling of Stress : Patient declined  Social Connections: Unknown (11/15/2017)   Social Connection and Isolation Panel [NHANES]    Frequency of Communication with Friends and Family: Patient declined    Frequency of Social  Gatherings with Friends and Family: Patient declined    Attends Religious Services: Patient declined    Database administrator or Organizations: Patient declined    Attends Banker Meetings: Patient declined    Marital Status: Patient declined  Intimate Partner Violence: Unknown (11/15/2017)   Humiliation, Afraid, Rape, and Kick questionnaire    Fear of Current or Ex-Partner: Patient declined    Emotionally Abused: Patient declined    Physically Abused: Patient declined  Sexually Abused: Patient declined    Past Surgical History:  Procedure Laterality Date   A/V FISTULAGRAM Left 09/02/2021   Procedure: A/V Fistulagram;  Surgeon: Renford Dills, MD;  Location: [facility];  Service: Cardiovascular;  Laterality: Left;   A/V FISTULAGRAM Left 06/27/2022   Procedure: A/V Fistulagram;  Surgeon: Renford Dills, MD;  Location: [facility];  Service: Cardiovascular;  Laterality: Left;   AV FISTULA PLACEMENT Left 07/22/2021   Procedure: ARTERIOVENOUS (AV) FISTULA CREATION (RADIALCEPHALIC);  Surgeon: Renford Dills, MD;  Location: [facility];  Service: Vascular;  Laterality: Left;   AV FISTULA PLACEMENT Left 12/09/2021   Procedure: ARTERIOVENOUS (AV) FISTULA CREATION (BRACHIAL CEPHALIC);  Surgeon: Renford Dills, MD;  Location: [facility];  Service: Vascular;  Laterality: Left;   BREAST BIOPSY Right 2014   breast ca   BREAST EXCISIONAL BIOPSY Right 07/19/2012   breast ca   BREAST SURGERY Right 2014   with sentinel node bx subareaolar duct excision   CARDIOVERSION N/A 11/28/2018   Procedure: CARDIOVERSION;  Surgeon: Antonieta Iba, MD;  Location: [facility];  Service: Cardiovascular;  Laterality: N/A;   COLONOSCOPY WITH PROPOFOL N/A 01/30/2017   Procedure: COLONOSCOPY WITH PROPOFOL;  Surgeon: Kieth Brightly, MD;  Location: [facility];  Service: Endoscopy;  Laterality: N/A;   CYSTOSCOPY W/ RETROGRADES  06/17/2021   Procedure: CYSTOSCOPY WITH  RETROGRADE PYELOGRAM;  Surgeon: Sondra Come, MD;  Location: [facility];  Service: Urology;;   CYSTOSCOPY W/ URETERAL STENT PLACEMENT Left 02/14/2021   Procedure: CYSTOSCOPY WITH RETROGRADE PYELOGRAM/URETERAL STENT PLACEMENT;  Surgeon: Sondra Come, MD;  Location: [facility];  Service: Urology;  Laterality: Left;   CYSTOSCOPY/URETEROSCOPY/HOLMIUM LASER/STENT PLACEMENT Left 06/17/2021   Procedure: CYSTOSCOPY/URETEROSCOPY/HOLMIUM LASER/STENT EXCHANGE;  Surgeon: Sondra Come, MD;  Location: [facility];  Service: Urology;  Laterality: Left;   DIALYSIS/PERMA CATHETER INSERTION N/A 02/24/2021   Procedure: DIALYSIS/PERMA CATHETER INSERTION;  Surgeon: Annice Needy, MD;  Location: [facility];  Service: Cardiovascular;  Laterality: N/A;   DIALYSIS/PERMA CATHETER INSERTION N/A 12/16/2021   Procedure: DIALYSIS/PERMA CATHETER INSERTION;  Surgeon: Renford Dills, MD;  Location: [facility];  Service: Cardiovascular;  Laterality: N/A;   PERIPHERAL VASCULAR THROMBECTOMY Left 09/28/2021   Procedure: PERIPHERAL VASCULAR THROMBECTOMY;  Surgeon: Renford Dills, MD;  Location: [facility];  Service: Cardiovascular;  Laterality: Left;   REVISON OF ARTERIOVENOUS FISTULA Left 04/28/2022   Procedure: REVISON OF ARTERIOVENOUS FISTULA (BRACHIAL CEPHALIC);  Surgeon: Renford Dills, MD;  Location: [facility];  Service: Vascular;  Laterality: Left;    Family History  Problem Relation Age of Onset   Diabetes Father    Hypertension Father    Parkinson's disease Father    Hypertension Mother    Rheum arthritis Mother    Atrial fibrillation Mother    Heart failure Mother    Heart attack Mother        died in her mid-70's.   Colon cancer Cousin 54   Breast cancer Neg Hx     Allergies  Allergen Reactions   Spironolactone     Hyperkalemia  Pt unaware of this allergy       Latest Ref Rng & Units 04/28/2022    6:39 AM 04/21/2022   11:09 AM 12/16/2021    8:30 AM  CBC  WBC 4.0 -  10.5 K/uL  8.4  9.1   Hemoglobin 12.0 - 15.0 g/dL 95.6  21.3  08.6   Hematocrit 36.0 -  46.0 % 39.0  40.0  34.7   Platelets 150 - 400 K/uL  196  227       CMP     Component Value Date/Time   NA 137 04/28/2022 0639   NA 144 02/27/2020 0931   NA 141 03/09/2014 1533   K 3.8 04/28/2022 0639   K 4.2 03/09/2014 1533   CL 97 (L) 04/28/2022 0639   CL 101 03/09/2014 1533   CO2 29 04/21/2022 1109   CO2 30 03/09/2014 1533   GLUCOSE 131 (H) 04/28/2022 0639   GLUCOSE 95 03/09/2014 1533   BUN 28 (H) 04/28/2022 0639   BUN 40 (H) 02/27/2020 0931   BUN 35 (H) 03/09/2014 1533   CREATININE 7.10 (H) 04/28/2022 0639   CREATININE 1.50 (H) 03/09/2014 1533   CALCIUM 8.4 (L) 04/21/2022 1109   CALCIUM 8.8 03/09/2014 1533   PROT 7.5 07/26/2021 2222   PROT 7.3 03/09/2014 1533   ALBUMIN 3.6 07/26/2021 2222   ALBUMIN 3.5 03/09/2014 1533   AST 17 07/26/2021 2222   AST 16 03/09/2014 1533   ALT 6 07/26/2021 2222   ALT 23 03/09/2014 1533   ALKPHOS 162 (H) 07/26/2021 2222   ALKPHOS 113 03/09/2014 1533   BILITOT 0.8 07/26/2021 2222   BILITOT 0.3 03/09/2014 1533   GFRNONAA 6 (L) 04/21/2022 1109   GFRNONAA 39 (L) 03/09/2014 1533   GFRNONAA 55 (L) 07/31/2013 1521     No results found.     Assessment & Plan:   1. ESRD (end stage renal disease) (HCC) I had a long discussion with the patient regarding fistulas and progression with PermCath placement.  The patient is understandably apprehensive about having her PermCath removed due to the fact that if there are issues she may not have any ability to dialyze.  I discussed the natural progression with fistulas and grafts and that unfortunately is a side effect of dialysis sometimes there are issues with grafts which cannot quite be helped.  However in cases where the dialysis access is clotted we will try to declot the access systems possible unfortunately we are unable to protect when access may fail.  Based on her studies today she does have a elevated  velocities at the confluence but this is actually improved since her last studies.  She has not had any issues during dialysis.  And given the fact that her PermCath is actually having issues I think at this time removing it would be in her best interest to avoid any long-term residual issues such as infection or any damage to the central vessels.  Following this discussion the patient is agreeable to move forward with PermCath removal.  Maintain a close eye and have her return in 3 months with an HDA  2. Essential hypertension Continue antihypertensive medications as already ordered, these medications have been reviewed and there are no changes at this time.  3. Dyslipidemia Continue statin as ordered and reviewed, no changes at this time  4. Controlled type 2 diabetes mellitus with hyperglycemia, without long-term current use of insulin (HCC) Continue hypoglycemic medications as already ordered, these medications have been reviewed and there are no changes at this time.  Hgb A1C to be monitored as already arranged by primary service   Current Outpatient Medications on File Prior to Visit  Medication Sig Dispense Refill   apixaban (ELIQUIS) 5 MG TABS tablet Take 1 tablet (5 mg total) by mouth 2 (two) times daily. 180 tablet 1   atorvastatin (LIPITOR) 40 MG tablet  Take 40 mg by mouth daily.     Cholecalciferol (VITAMIN D3) 1.25 MG (50000 UT) CAPS Take 1 capsule by mouth once a week.     cloNIDine (CATAPRES) 0.1 MG tablet Take 1 tablet (0.1 mg total) by mouth 2 (two) times daily. 180 tablet 2   CRANBERRY PO Take 1 capsule by mouth daily.     ergocalciferol (VITAMIN D2) 1.25 MG (50000 UT) capsule Take 1 capsule (50,000 Units total) by mouth once a week. 5 capsule 0   ezetimibe (ZETIA) 10 MG tablet Take 10 mg by mouth daily with supper.     ferric gluconate 250 mg in sodium chloride 0.9 % 250 mL Inject 250 mg into the vein Every Tuesday,Thursday,and Saturday with dialysis.     folic acid  (FOLVITE) 1 MG tablet Take 1 tablet (1 mg total) by mouth daily. 30 tablet 0   gabapentin (NEURONTIN) 300 MG capsule Take 1 capsule (300 mg total) by mouth at bedtime. 30 capsule 0   HYDROcodone-acetaminophen (NORCO) 5-325 MG tablet Take 1 tablet by mouth every 6 (six) hours as needed for moderate pain. 30 tablet 0   Lancets (ONETOUCH DELICA PLUS LANCET30G) MISC      levothyroxine (SYNTHROID) 112 MCG tablet Take 1 tablet (112 mcg total) by mouth daily before breakfast. 30 tablet 0   melatonin 5 MG TABS Take 2 tablets (10 mg total) by mouth at bedtime as needed (sleep). 60 tablet 0   metoprolol succinate (TOPROL-XL) 25 MG 24 hr tablet Take 1 tablet (25 mg total) by mouth at bedtime. Take with or immediately following a meal. 90 tablet 3   midodrine (PROAMATINE) 10 MG tablet Take 1 tablet (10 mg total) by mouth Every Tuesday,Thursday,and Saturday with dialysis. 30 tablet 0   ONETOUCH ULTRA test strip      polyethylene glycol powder (GLYCOLAX/MIRALAX) 17 GM/SCOOP powder Take 1 Container by mouth once.     traMADol (ULTRAM) 50 MG tablet Take 1 tablet (50 mg total) by mouth every 12 (twelve) hours as needed for moderate pain. 30 tablet 0   TRULICITY 1.5 MG/0.5ML SOPN Inject 1.5 mg into the skin every Monday.     VELPHORO 500 MG chewable tablet Chew 1,000 mg by mouth 3 (three) times daily.     acetaminophen (TYLENOL) 325 MG tablet Take 2 tablets (650 mg total) by mouth every 6 (six) hours as needed for mild pain (or Fever >/= 101). (Patient not taking: Reported on 08/23/2022)     albuterol (VENTOLIN HFA) 108 (90 Base) MCG/ACT inhaler Inhale 2 puffs into the lungs every 6 (six) hours as needed for wheezing or shortness of breath. (Patient not taking: Reported on 08/23/2022) 8.5 g 0   No current facility-administered medications on file prior to visit.    There are no Patient Instructions on file for this visit. No follow-ups on file.   Georgiana Spinner, NP

## 2022-09-04 ENCOUNTER — Encounter
Admission: RE | Disposition: A | Discharge status: Home / Self Care | Payer: Self-pay | Source: Home / Self Care | Attending: Vascular Surgery

## 2022-09-04 ENCOUNTER — Ambulatory Visit
Admission: RE | Admit: 2022-09-04 | Discharge: 2022-09-04 | Disposition: A | Discharge status: Home / Self Care | Payer: BC Managed Care – PPO | Attending: Vascular Surgery | Admitting: Vascular Surgery

## 2022-09-04 DIAGNOSIS — Z95828 Presence of other vascular implants and grafts: Secondary | ICD-10-CM

## 2022-09-04 DIAGNOSIS — N186 End stage renal disease: Secondary | ICD-10-CM | POA: Insufficient documentation

## 2022-09-04 DIAGNOSIS — I132 Hypertensive heart and chronic kidney disease with heart failure and with stage 5 chronic kidney disease, or end stage renal disease: Secondary | ICD-10-CM | POA: Insufficient documentation

## 2022-09-04 DIAGNOSIS — Z7985 Long-term (current) use of injectable non-insulin antidiabetic drugs: Secondary | ICD-10-CM | POA: Insufficient documentation

## 2022-09-04 DIAGNOSIS — Z992 Dependence on renal dialysis: Secondary | ICD-10-CM

## 2022-09-04 DIAGNOSIS — E785 Hyperlipidemia, unspecified: Secondary | ICD-10-CM | POA: Diagnosis not present

## 2022-09-04 DIAGNOSIS — I5032 Chronic diastolic (congestive) heart failure: Secondary | ICD-10-CM | POA: Insufficient documentation

## 2022-09-04 DIAGNOSIS — Z4901 Encounter for fitting and adjustment of extracorporeal dialysis catheter: Secondary | ICD-10-CM | POA: Insufficient documentation

## 2022-09-04 DIAGNOSIS — Z452 Encounter for adjustment and management of vascular access device: Secondary | ICD-10-CM | POA: Diagnosis not present

## 2022-09-04 DIAGNOSIS — E1165 Type 2 diabetes mellitus with hyperglycemia: Secondary | ICD-10-CM | POA: Diagnosis not present

## 2022-09-04 DIAGNOSIS — E1122 Type 2 diabetes mellitus with diabetic chronic kidney disease: Secondary | ICD-10-CM | POA: Insufficient documentation

## 2022-09-04 HISTORY — PX: DIALYSIS/PERMA CATHETER REMOVAL: CATH118289

## 2022-09-04 SURGERY — DIALYSIS/PERMA CATHETER REMOVAL
Anesthesia: LOCAL

## 2022-09-04 MED ORDER — LIDOCAINE-EPINEPHRINE (PF) 1 %-1:200000 IJ SOLN
INTRAMUSCULAR | Status: DC | PRN
  Administered 2022-09-04: 20 mL

## 2022-09-04 SURGICAL SUPPLY — 1 items: TRAY LACERAT/PLASTIC (MISCELLANEOUS) IMPLANT

## 2022-09-04 NOTE — Discharge Instructions (Addendum)
Tunneled Catheter Removal, Care After Refer to this sheet in the next few weeks. These instructions provide you with information about caring for yourself after your procedure. Your health care provider may also give you more specific instructions. Your treatment has been planned according to current medical practices, but problems sometimes occur. Call your health care provider if you have any problems or questions after your procedure. What can I expect after the procedure? After the procedure, it is common to have: Some mild redness, swelling, and pain around your catheter site.   Follow these instructions at home: Incision care  Check your removal site  every day for signs of infection. Check for: More redness, swelling, or pain. More fluid or blood. Warmth. Pus or a bad smell. Remove your dressing in 48hrs leave open to air  Activity  Return to your normal activities as told by your health care provider. Ask your health care provider what activities are safe for you. Do not lift anything that is heavier than 10 lb (4.5 kg) for 3 days  You may shower tomorrow  Contact a health care provider if: You have more fluid or blood coming from your removal site You have more redness, swelling, or pain at your incisions or around the area where your catheter was removed Your removal site feel warm to the touch. You feel unusually weak. You feel nauseous.. Get help right away if You have swelling in your arm, shoulder, neck, or face. You develop chest pain. You have difficulty breathing. You feel dizzy or light-headed. You have pus or a bad smell coming from your removal site You have a fever. You develop bleeding from your removal site, and your bleeding does not stop. This information is not intended to replace advice given to you by your health care provider. Make sure you discuss any questions you have with your health care provider. Document Released: 01/17/2012 Document Revised:  10/03/2015 Document Reviewed: 10/26/2014 Elsevier Interactive Patient Education  2017 [employer]vier Inc[employer] 

## 2022-09-04 NOTE — Op Note (Signed)
Operative Note     Preoperative diagnosis:   1. ESRD with functional permanent access  Postoperative diagnosis:  1. ESRD with functional permanent access  Procedure:  Removal of right jugular Permcath  Surgeon:  Festus Barren, MD  Assistant: Rolla Plate, NP  Anesthesia:  Local  EBL:  Minimal  Indication for the Procedure:  The patient has a functional permanent dialysis access and no longer needs their permcath.  This can be removed.  Risks and benefits are discussed and informed consent is obtained.  Description of the Procedure:  The patient's right neck, chest and existing catheter were sterilely prepped and draped. The area around the catheter was anesthetized copiously with 1% lidocaine. The catheter was dissected out with curved hemostats until the cuff was freed from the surrounding fibrous sheath. The fiber sheath was transected, and the catheter was then removed in its entirety using gentle traction. Pressure was held and sterile dressings were placed. The patient tolerated the procedure well and was taken to the recovery room in stable condition.     Festus Barren  09/04/2022, 3:54 PM This note was created with Dragon Medical transcription system. Any errors in dictation are purely unintentional.

## 2022-09-04 NOTE — Interval H&P Note (Signed)
History and Physical Interval Note:  09/04/2022 2:14 PM  Nicole Schmidt  has presented today for surgery, with the diagnosis of Perma Cath Removal    End Stage Renal.  The various methods of treatment have been discussed with the patient and family. After consideration of risks, benefits and other options for treatment, the patient has consented to  Procedure(s): DIALYSIS/PERMA CATHETER REMOVAL (N/A) as a surgical intervention.  The patient's history has been reviewed, patient examined, no change in status, stable for surgery.  I have reviewed the patient's chart and labs.  Questions were answered to the patient's satisfaction.     Festus Barren

## 2022-09-05 ENCOUNTER — Encounter: Payer: Self-pay | Admitting: Vascular Surgery

## 2022-09-19 DIAGNOSIS — E662 Morbid (severe) obesity with alveolar hypoventilation: Secondary | ICD-10-CM | POA: Diagnosis not present

## 2022-09-23 DIAGNOSIS — I482 Chronic atrial fibrillation, unspecified: Secondary | ICD-10-CM | POA: Diagnosis not present

## 2022-09-23 DIAGNOSIS — J9601 Acute respiratory failure with hypoxia: Secondary | ICD-10-CM | POA: Diagnosis not present

## 2022-09-23 DIAGNOSIS — G4733 Obstructive sleep apnea (adult) (pediatric): Secondary | ICD-10-CM | POA: Diagnosis not present

## 2022-09-23 DIAGNOSIS — I5032 Chronic diastolic (congestive) heart failure: Secondary | ICD-10-CM | POA: Diagnosis not present

## 2022-09-24 ENCOUNTER — Other Ambulatory Visit: Payer: Self-pay

## 2022-09-24 ENCOUNTER — Emergency Department
Admission: EM | Admit: 2022-09-24 | Discharge: 2022-09-24 | Disposition: A | Discharge status: Home / Self Care | Payer: BC Managed Care – PPO | Attending: Emergency Medicine | Admitting: Emergency Medicine

## 2022-09-24 DIAGNOSIS — Y828 Other medical devices associated with adverse incidents: Secondary | ICD-10-CM | POA: Insufficient documentation

## 2022-09-24 DIAGNOSIS — L03313 Cellulitis of chest wall: Secondary | ICD-10-CM | POA: Diagnosis not present

## 2022-09-24 DIAGNOSIS — Z7901 Long term (current) use of anticoagulants: Secondary | ICD-10-CM | POA: Insufficient documentation

## 2022-09-24 DIAGNOSIS — Z992 Dependence on renal dialysis: Secondary | ICD-10-CM | POA: Diagnosis not present

## 2022-09-24 DIAGNOSIS — N186 End stage renal disease: Secondary | ICD-10-CM | POA: Insufficient documentation

## 2022-09-24 DIAGNOSIS — T82838A Hemorrhage of vascular prosthetic devices, implants and grafts, initial encounter: Secondary | ICD-10-CM | POA: Diagnosis not present

## 2022-09-24 LAB — AEROBIC/ANAEROBIC CULTURE W GRAM STAIN (SURGICAL/DEEP WOUND)

## 2022-09-24 MED ORDER — CEPHALEXIN 500 MG PO CAPS: 500.0000 mg | ORAL_CAPSULE | Freq: Four times a day (QID) | ORAL | 0 refills | Status: AC

## 2022-09-24 MED ORDER — CEPHALEXIN 500 MG PO CAPS
500.0000 mg | ORAL_CAPSULE | Freq: Once | ORAL | Status: AC
  Administered 2022-09-24: 500 mg via ORAL
  Filled 2022-09-24: qty 1

## 2022-09-24 NOTE — ED Provider Notes (Signed)
[facility] Provider Note    Event Date/Time   First MD Initiated Contact with Patient 09/24/22 1610     (approximate)   History   bleeding from old dialysis port   HPI  Nicole Schmidt is a 57 y.o. female   Past medical history of VTE on Eliquis, end-stage renal disease on hemodialysis, who presents with an old right port site that is bleeding.  Had PermCath to the right chest removed approximately 3 weeks ago by Dr. Wyn Quaker.  Had been healing well but awoke this morning with some drainage to the site, mixed scant pus with some blood.  She denies any trauma to the area.  She has otherwise been in her regular state of health.  She denies fever or chills.  Had no other acute medical complaints.  External Medical Documents Reviewed: Vascular note from July 2024 documenting the removal of right PermCath      Physical Exam   Triage Vital Signs: ED Triage Vitals  Encounter Vitals Group     BP 09/24/22 1539 (!) 152/93     Systolic BP Percentile --      Diastolic BP Percentile --      Pulse Rate 09/24/22 1539 (!) 108     Resp 09/24/22 1539 18     Temp 09/24/22 1539 98 F (36.7 C)     Temp Source 09/24/22 1539 Oral     SpO2 09/24/22 1539 95 %     Weight 09/24/22 1544 (!) 341 lb 11.4 oz (155 kg)     Height 09/24/22 1544 5\' 4"  (1.626 m)     Head Circumference --      Peak Flow --      Pain Score 09/24/22 1642 0     Pain Loc --      Pain Education --      Exclude from Growth Chart --     Most recent vital signs: Vitals:   09/24/22 1539 09/24/22 1644  BP: (!) 152/93 (!) 105/48  Pulse: (!) 108 75  Resp: 18 16  Temp: 98 F (36.7 C)   SpO2: 95% 95%    General: Awake, no distress.  CV:  Good peripheral perfusion.  Resp:  Normal effort.  Abd:  No distention.  Other:  Awake well-appearing normal vital signs, no fever.  She has the right port site which has some small bruising and very small erythema surrounding and when I press around the site there  is some scant drainage mixed blood and serous drainage with some small purulence.   ED Results / Procedures / Treatments   Labs (all labs ordered are listed, but only abnormal results are displayed) Labs Reviewed  AEROBIC/ANAEROBIC CULTURE W GRAM STAIN (SURGICAL/DEEP WOUND)   PROCEDURES:  Critical Care performed: No  Procedures   MEDICATIONS ORDERED IN ED: Medications  cephALEXin (KEFLEX) capsule 500 mg (500 mg Oral Given 09/24/22 1638)    External physician / consultants:  I spoke with the [facility] of vascular surgery regarding care plan for this patient.   IMPRESSION / MDM / ASSESSMENT AND PLAN / ED COURSE  I reviewed the triage vital signs and the nursing notes.                                Patient's presentation is most consistent with acute presentation with potential threat to life or bodily function.  Differential diagnosis includes, but is not limited  to, acute blood loss anemia, abscess, cellulitis   The patient is on the cardiac monitor to evaluate for evidence of arrhythmia and/or significant heart rate changes.  MDM:    Mostly hemostatic site with some scant purulence and bloody drainage serous drainage after removal of port approximately 3 weeks ago.  No systemic signs of infection with normal vital signs no fever.   She has no other acute medical complaints.  I spoke with Dr. Evie Lacks vascular surgery who recommends no further advanced imaging at this time, may be related to fat necrosis, but also recommended wound culture and start on skin infection coverage, I will give her a dose of Keflex in the emergency department and a prescription for the same.  Wound culture was sent.  I considered hospitalization but given her stability, highly doubt systemic infection, and plan for close follow-up with vascular surgery this week I think she can be managed as an outpatient.      FINAL CLINICAL IMPRESSION(S) / ED DIAGNOSES   Final diagnoses:  Cellulitis of chest  wall     Rx / DC Orders   ED Discharge Orders          Ordered    cephALEXin (KEFLEX) 500 MG capsule  4 times daily        09/24/22 1632             Note:  This document was prepared using Dragon voice recognition software and may include unintentional dictation errors.    Pilar Jarvis, MD 09/24/22 [phone]

## 2022-09-24 NOTE — Discharge Instructions (Addendum)
Take antibiotics for 7 days as prescribed.   Call your vascular surgeon Dr. Wyn Quaker tomorrow morning to schedule follow-up appointment for next week.  Please keep your wound clean by washing at least daily with soap and water. If you see any signs of infection like spreading redness, pus coming from the wound, extreme pain, fevers, chills or any other worsening doctor right away or come back to the emergency department  Keep the site covered with a bandage.  If you develop any new, worsening or expected symptoms like increased redness, increased drainage, severe pain, or fever or chills then call your doctor right away or come back to the emergency department.  Thank you for choosing [name] for your health care today!  Please see your primary doctor this week for a follow up appointment.   If you have any new, worsening, or unexpected symptoms call your doctor right away or come back to the emergency department for reevaluation.  It was my pleasure to care for you today.   Daneil Dan Modesto Charon, MD

## 2022-09-24 NOTE — ED Triage Notes (Signed)
Pt arrives via POV w/ c/o bleeding from old dialysis access port in R chest. Pt now has fistula. Pt reports she woke up this morning and there was "a lot" of blood. States her "neck was soaked". Pt reports she was told to be seen if she any issues with site. Did put a bandaid on, bled through, now has guaze over site with bleeding controlled.

## 2022-09-30 ENCOUNTER — Emergency Department
Admission: EM | Admit: 2022-09-30 | Discharge: 2022-09-30 | Disposition: A | Discharge status: Home / Self Care | Payer: BC Managed Care – PPO | Attending: Emergency Medicine | Admitting: Emergency Medicine

## 2022-09-30 ENCOUNTER — Emergency Department: Payer: BC Managed Care – PPO

## 2022-09-30 DIAGNOSIS — Z992 Dependence on renal dialysis: Secondary | ICD-10-CM | POA: Diagnosis not present

## 2022-09-30 DIAGNOSIS — I120 Hypertensive chronic kidney disease with stage 5 chronic kidney disease or end stage renal disease: Secondary | ICD-10-CM | POA: Diagnosis not present

## 2022-09-30 DIAGNOSIS — N186 End stage renal disease: Secondary | ICD-10-CM | POA: Diagnosis not present

## 2022-09-30 DIAGNOSIS — R0602 Shortness of breath: Secondary | ICD-10-CM | POA: Insufficient documentation

## 2022-09-30 DIAGNOSIS — R0989 Other specified symptoms and signs involving the circulatory and respiratory systems: Secondary | ICD-10-CM | POA: Diagnosis not present

## 2022-09-30 DIAGNOSIS — R062 Wheezing: Secondary | ICD-10-CM | POA: Diagnosis not present

## 2022-09-30 DIAGNOSIS — I959 Hypotension, unspecified: Secondary | ICD-10-CM | POA: Diagnosis not present

## 2022-09-30 DIAGNOSIS — R0902 Hypoxemia: Secondary | ICD-10-CM | POA: Diagnosis not present

## 2022-09-30 DIAGNOSIS — J449 Chronic obstructive pulmonary disease, unspecified: Secondary | ICD-10-CM | POA: Insufficient documentation

## 2022-09-30 DIAGNOSIS — R000 Tachycardia, unspecified: Secondary | ICD-10-CM | POA: Diagnosis not present

## 2022-09-30 LAB — BASIC METABOLIC PANEL
Anion gap: 10 (ref 5–15)
BUN: 50 mg/dL — ABNORMAL HIGH (ref 6–20)
CO2: 27 mmol/L (ref 22–32)
Calcium: 8.2 mg/dL — ABNORMAL LOW (ref 8.9–10.3)
Chloride: 99 mmol/L (ref 98–111)
Creatinine, Ser: 8.93 mg/dL — ABNORMAL HIGH (ref 0.44–1.00)
GFR, Estimated: 5 mL/min — ABNORMAL LOW (ref >60)
Glucose, Bld: 180 mg/dL — ABNORMAL HIGH (ref 70–99)
Potassium: 5 mmol/L (ref 3.5–5.1)
Sodium: 136 mmol/L (ref 135–145)

## 2022-09-30 LAB — CBC
HCT: 41 % (ref 36.0–46.0)
Hemoglobin: 12.4 g/dL (ref 12.0–15.0)
MCH: 35 pg — ABNORMAL HIGH (ref 26.0–34.0)
MCHC: 30.2 g/dL (ref 30.0–36.0)
MCV: 115.8 fL — ABNORMAL HIGH (ref 80.0–100.0)
Platelets: 156 10*3/uL (ref 150–400)
RBC: 3.54 MIL/uL — ABNORMAL LOW (ref 3.87–5.11)
RDW: 16 % — ABNORMAL HIGH (ref 11.5–15.5)
WBC: 11.3 10*3/uL — ABNORMAL HIGH (ref 4.0–10.5)
nRBC: 0.4 % — ABNORMAL HIGH (ref 0.0–0.2)

## 2022-09-30 NOTE — Discharge Instructions (Signed)
Please go directly to dialysis as we discussed

## 2022-09-30 NOTE — ED Provider Notes (Signed)
[facility] Provider Note    Event Date/Time   First MD Initiated Contact with Patient 09/30/22 1046     (approximate)   History   Shortness of Breath   HPI  Nicole Schmidt is a 57 y.o. female with a history of PE, end-stage renal disease, atrial fibrillation, COPD presents with complaints of shortness of breath.  Patient reports over the last 2 days she has felt slightly winded, she reports it worsened last night while she was in bed.  She is feeling better now.  She was due for dialysis today but came to the emergency department instead.  Last dialysis was on Thursday as scheduled.  Dr. Cherylann Ratel is her nephrologist.  No chest pain, no fevers chills or cough.     Physical Exam   Triage Vital Signs: ED Triage Vitals  Encounter Vitals Group     BP 09/30/22 1045 (!) 138/113     Systolic BP Percentile --      Diastolic BP Percentile --      Pulse Rate 09/30/22 1045 (!) 104     Resp 09/30/22 1045 (!) 21     Temp 09/30/22 1047 98.7 F (37.1 C)     Temp Source 09/30/22 1047 Oral     SpO2 09/30/22 1044 97 %     Weight 09/30/22 1043 (!) 155 kg (341 lb 11.4 oz)     Height 09/30/22 1043 1.626 m (5\' 4" )     Head Circumference --      Peak Flow --      Pain Score 09/30/22 1045 0     Pain Loc --      Pain Education --      Exclude from Growth Chart --     Most recent vital signs: Vitals:   09/30/22 1047 09/30/22 1100  BP: (!) 138/113 (!) 90/57  Pulse: 100 (!) 106  Resp: (!) 22 (!) 21  Temp: 98.7 F (37.1 C)   SpO2: 92% 93%     General: Awake, no distress.  CV:  Good peripheral perfusion.  Resp:  Mild tachypnea Abd:  No distention.  Other:  AV fistula left upper extremity, good thrill   ED Results / Procedures / Treatments   Labs (all labs ordered are listed, but only abnormal results are displayed) Labs Reviewed  CBC - Abnormal; Notable for the following components:      Result Value   WBC 11.3 (*)    RBC 3.54 (*)    MCV 115.8 (*)     MCH 35.0 (*)    RDW 16.0 (*)    nRBC 0.4 (*)    All other components within normal limits  BASIC METABOLIC PANEL - Abnormal; Notable for the following components:   Glucose, Bld 180 (*)    BUN 50 (*)    Creatinine, Ser 8.93 (*)    Calcium 8.2 (*)    GFR, Estimated 5 (*)    All other components within normal limits     EKG  ED ECG REPORT I, Jene Every, the attending physician, personally viewed and interpreted this ECG.  Date: 09/30/2022  Rhythm: Atrial fibrillation QRS Axis: normal Intervals: Normal ST/T Wave abnormalities: normal Narrative Interpretation: no evidence of acute ischemia    RADIOLOGY  Chest x-ray viewed interpret by me, no clear pulmonary edema, pending radiology review   PROCEDURES:  Critical Care performed:   Procedures   MEDICATIONS ORDERED IN ED: Medications - No data to display   IMPRESSION /  MDM / ASSESSMENT AND PLAN / ED COURSE  I reviewed the triage vital signs and the nursing notes. Patient's presentation is most consistent with exacerbation of chronic illness.  Patient presents with shortness of breath as described above.  Differential includes volume overload related to end-stage renal disease, less likely pneumonia, less likely COPD exacerbation  Pending labs, x-ray  X-rays consistent with likely pulmonary vascular congestion but no overt edema   Lab work reviewed, potassium is normal.  Discussed with Dr. Cherylann Ratel of nephrology, recommends discharge with outpatient dialysis today, discussed with patient and she agrees with this plan, return precautions discussed.      FINAL CLINICAL IMPRESSION(S) / ED DIAGNOSES   Final diagnoses:  Shortness of breath  ESRD (end stage renal disease) (HCC)     Rx / DC Orders   ED Discharge Orders     None        Note:  This document was prepared using Dragon voice recognition software and may include unintentional dictation errors.   Jene Every, MD 09/30/22 1139

## 2022-09-30 NOTE — ED Triage Notes (Addendum)
Pt from home reports increased SOB last few days on exertion and when lying down. Pt is a dialysis pt. Missing her appointment today. PT states she only wears oxygen at night time, pt placed herself on 3 L at home for reported RA of 75%. EMS duo-ned, 125 solumedrol. PT is 95% on RA on arrival

## 2022-10-02 ENCOUNTER — Encounter (INDEPENDENT_AMBULATORY_CARE_PROVIDER_SITE_OTHER): Payer: Self-pay | Admitting: Nurse Practitioner

## 2022-10-02 ENCOUNTER — Ambulatory Visit (INDEPENDENT_AMBULATORY_CARE_PROVIDER_SITE_OTHER): Payer: BC Managed Care – PPO | Admitting: Nurse Practitioner

## 2022-10-02 VITALS — BP 137/74 | HR 98 | Resp 20 | Ht 64.0 in | Wt 325.0 lb

## 2022-10-02 DIAGNOSIS — E1165 Type 2 diabetes mellitus with hyperglycemia: Secondary | ICD-10-CM

## 2022-10-02 DIAGNOSIS — N186 End stage renal disease: Secondary | ICD-10-CM | POA: Diagnosis not present

## 2022-10-02 DIAGNOSIS — I10 Essential (primary) hypertension: Secondary | ICD-10-CM

## 2022-10-02 MED ORDER — CIPROFLOXACIN HCL 250 MG PO TABS: 250.0000 mg | ORAL_TABLET | Freq: Two times a day (BID) | ORAL | 0 refills | Status: DC

## 2022-10-03 NOTE — Progress Notes (Signed)
Subjective:    Patient ID: Nicole Schmidt, female    DOB: Feb 11, 1966, 57 y.o.   MRN: [id number] Chief Complaint  Patient presents with   Follow-up    Follow up- Schedule an appointment with Annice Needy, MD    Nicole Schmidt is a 57 year old female who presents today for evaluation of cellulitis of the left chest wall.  The patient had cellulitis following PermCath removal.  She notes that she has significant purulent drainage and it was also expressed during her emergency room visit.  She was placed on Keflex.  The redness is largely resolved as has the tenderness.  There is still a scant amount of drainage still coming from the wound itself.    Review of Systems  Skin:  Positive for wound.  All other systems reviewed and are negative.      Objective:   Physical Exam Vitals reviewed.  HENT:     Head: Normocephalic.  Cardiovascular:     Rate and Rhythm: Normal rate.     Pulses: Normal pulses.  Pulmonary:     Effort: Pulmonary effort is normal.  Skin:    General: Skin is warm and dry.  Neurological:     Mental Status: She is alert and oriented to person, place, and time.  Psychiatric:        Mood and Affect: Mood normal.        Behavior: Behavior normal.        Thought Content: Thought content normal.        Judgment: Judgment normal.     BP 137/74 (BP Location: Right Wrist)   Pulse 98   Resp 20   Ht 5\' 4"  (1.626 m)   Wt (!) 325 lb (147.4 kg)   LMP 07/17/2012   BMI 55.79 kg/m   Past Medical History:  Diagnosis Date   (HFpEF) heart failure with preserved ejection fraction (HCC)    a. 10/2018 Echo: EF 60-65%, diast dysfxn, RVSP 50.7 mmHg; b. 01/2020 Echo: EF 65-70%, G2DD; c. 01/2021 Echo: EF 60-65%, no rwma, mod LVH. Nl RV fxn, mildly enlarged RV. Mod BAE. Triv MR. d. 05/2021 Echo: EF 55-60; no RWMAs, mild LVH, mild TR.   Acquired hypothyroidism 02/09/2020   Adenoma of left adrenal gland    Anemia    Aortic atherosclerosis (HCC)    Arthritis    Breast cancer of  upper-inner quadrant of right female breast (HCC)    Right breast invasive CA and DCIS , 7 mm T1,N0,M0. Er/PR pos, her 2 negative.  Margins 1 mm.   COPD (chronic obstructive pulmonary disease) (HCC)    Diabetes mellitus type 2 in obese 02/09/2020   Difficult intubation 09/28/2021   a.) mallampati III-IV; BMI >55 kg/m   Diverticulosis    Dyslipidemia 02/09/2020   ESBL (extended spectrum beta-lactamase) producing bacteria infection 03/20/2021   a. urine culture (+) for ESBL producing E.coli and P. mirabilis   ESRD (end stage renal disease) on dialysis (HCC)    a. T-Th-Sat HD initiated 02/2021   Essential hypertension    Gout    History of kidney stones    Long term current use of anticoagulant    a.) apixaban   Menopause    age 23   Morbid obesity (HCC)    OSA treated with BiPAP    a. on nocturnal PAP therapy; Trilogy 100 ventilator   Permanent atrial fibrillation (HCC)    a. Dx 10/2018 in setting of PNA. b. CHA2DS2-VASc = 7 (sex, HFpEF,  HTN, PE x2, aortic plaque, T2DM); c. s/p successful DCCV 11/2018. d. subsequent recurrent Afib; rate/rhythm maintained on oral metoprolol succinate; chronically anticoagulated using standard dose apixaban.   Personal history of radiation therapy    Pulmonary embolism (HCC) 10/2014   a. on chronic apixaban   Restrictive lung disease    Shingles 10/24/2021   Sleep difficulties    a.) takes melatonin   Thyroid goiter     Social History   Socioeconomic History   Marital status: Single    Spouse name: Not on file   Number of children: Not on file   Years of education: Not on file   Highest education level: Not on file  Occupational History   Occupation: retired  Tobacco Use   Smoking status: Never    Passive exposure: Never   Smokeless tobacco: Never  Vaping Use   Vaping status: Never Used  Substance and Sexual Activity   Alcohol use: No    Alcohol/week: 0.0 standard drinks of alcohol   Drug use: No   Sexual activity: Not on file   Other Topics Concern   Not on file  Social History Narrative   Lives in [place] by herself and retired.    Social Determinants of Health   Financial Resource Strain: Unknown (11/15/2017)   Overall Financial Resource Strain (CARDIA)    Difficulty of Paying Living Expenses: Patient declined  Food Insecurity: Unknown (11/15/2017)   Hunger Vital Sign    Worried About Running Out of Food in the Last Year: Patient declined    Ran Out of Food in the Last Year: Patient declined  Transportation Needs: Unknown (11/15/2017)   PRAPARE - Administrator, Civil Service (Medical): Patient declined    Lack of Transportation (Non-Medical): Patient declined  Physical Activity: Unknown (11/15/2017)   Exercise Vital Sign    Days of Exercise per Week: Patient declined    Minutes of Exercise per Session: Patient declined  Stress: Unknown (11/15/2017)   [employer] of Occupational Health - Occupational Stress Questionnaire    Feeling of Stress : Patient declined  Social Connections: Unknown (11/15/2017)   Social Connection and Isolation Panel [NHANES]    Frequency of Communication with Friends and Family: Patient declined    Frequency of Social Gatherings with Friends and Family: Patient declined    Attends Religious Services: Patient declined    Active Member of Clubs or Organizations: Patient declined    Attends Banker Meetings: Patient declined    Marital Status: Patient declined  Intimate Partner Violence: Unknown (11/15/2017)   Humiliation, Afraid, Rape, and Kick questionnaire    Fear of Current or Ex-Partner: Patient declined    Emotionally Abused: Patient declined    Physically Abused: Patient declined    Sexually Abused: Patient declined    Past Surgical History:  Procedure Laterality Date   A/V FISTULAGRAM Left 09/02/2021   Procedure: A/V Fistulagram;  Surgeon: Gilda Crease Latina Craver, MD;  Location: [facility];  Service: Cardiovascular;  Laterality:  Left;   A/V FISTULAGRAM Left 06/27/2022   Procedure: A/V Fistulagram;  Surgeon: Renford Dills, MD;  Location: [facility];  Service: Cardiovascular;  Laterality: Left;   AV FISTULA PLACEMENT Left 07/22/2021   Procedure: ARTERIOVENOUS (AV) FISTULA CREATION (RADIALCEPHALIC);  Surgeon: Renford Dills, MD;  Location: [facility];  Service: Vascular;  Laterality: Left;   AV FISTULA PLACEMENT Left 12/09/2021   Procedure: ARTERIOVENOUS (AV) FISTULA CREATION (BRACHIAL CEPHALIC);  Surgeon: Renford Dills, MD;  Location: [facility];  Service: Vascular;  Laterality: Left;   BREAST BIOPSY Right 2014   breast ca   BREAST EXCISIONAL BIOPSY Right 07/19/2012   breast ca   BREAST SURGERY Right 2014   with sentinel node bx subareaolar duct excision   CARDIOVERSION N/A 11/28/2018   Procedure: CARDIOVERSION;  Surgeon: Antonieta Iba, MD;  Location: [facility];  Service: Cardiovascular;  Laterality: N/A;   COLONOSCOPY WITH PROPOFOL N/A 01/30/2017   Procedure: COLONOSCOPY WITH PROPOFOL;  Surgeon: Kieth Brightly, MD;  Location: [facility];  Service: Endoscopy;  Laterality: N/A;   CYSTOSCOPY W/ RETROGRADES  06/17/2021   Procedure: CYSTOSCOPY WITH RETROGRADE PYELOGRAM;  Surgeon: Sondra Come, MD;  Location: [facility];  Service: Urology;;   CYSTOSCOPY W/ URETERAL STENT PLACEMENT Left 02/14/2021   Procedure: CYSTOSCOPY WITH RETROGRADE PYELOGRAM/URETERAL STENT PLACEMENT;  Surgeon: Sondra Come, MD;  Location: [facility];  Service: Urology;  Laterality: Left;   CYSTOSCOPY/URETEROSCOPY/HOLMIUM LASER/STENT PLACEMENT Left 06/17/2021   Procedure: CYSTOSCOPY/URETEROSCOPY/HOLMIUM LASER/STENT EXCHANGE;  Surgeon: Sondra Come, MD;  Location: [facility];  Service: Urology;  Laterality: Left;   DIALYSIS/PERMA CATHETER INSERTION N/A 02/24/2021   Procedure: DIALYSIS/PERMA CATHETER INSERTION;  Surgeon: Annice Needy, MD;  Location: [facility];  Service: Cardiovascular;  Laterality: N/A;    DIALYSIS/PERMA CATHETER INSERTION N/A 12/16/2021   Procedure: DIALYSIS/PERMA CATHETER INSERTION;  Surgeon: Renford Dills, MD;  Location: [facility];  Service: Cardiovascular;  Laterality: N/A;   DIALYSIS/PERMA CATHETER REMOVAL N/A 09/04/2022   Procedure: DIALYSIS/PERMA CATHETER REMOVAL;  Surgeon: Annice Needy, MD;  Location: [facility];  Service: Cardiovascular;  Laterality: N/A;   PERIPHERAL VASCULAR THROMBECTOMY Left 09/28/2021   Procedure: PERIPHERAL VASCULAR THROMBECTOMY;  Surgeon: Renford Dills, MD;  Location: [facility];  Service: Cardiovascular;  Laterality: Left;   REVISON OF ARTERIOVENOUS FISTULA Left 04/28/2022   Procedure: REVISON OF ARTERIOVENOUS FISTULA (BRACHIAL CEPHALIC);  Surgeon: Renford Dills, MD;  Location: [facility];  Service: Vascular;  Laterality: Left;    Family History  Problem Relation Age of Onset   Diabetes Father    Hypertension Father    Parkinson's disease Father    Hypertension Mother    Rheum arthritis Mother    Atrial fibrillation Mother    Heart failure Mother    Heart attack Mother        died in her mid-70's.   Colon cancer Cousin 54   Breast cancer Neg Hx     Allergies  Allergen Reactions   Spironolactone     Hyperkalemia  Pt unaware of this allergy       Latest Ref Rng & Units 09/30/2022   10:55 AM 04/28/2022    6:39 AM 04/21/2022   11:09 AM  CBC  WBC 4.0 - 10.5 K/uL 11.3   8.4   Hemoglobin 12.0 - 15.0 g/dL 40.9  81.1  91.4   Hematocrit 36.0 - 46.0 % 41.0  39.0  40.0   Platelets 150 - 400 K/uL 156   196       CMP     Component Value Date/Time   NA 136 09/30/2022 1055   NA 144 02/27/2020 0931   NA 141 03/09/2014 1533   K 5.0 09/30/2022 1055   K 4.2 03/09/2014 1533   CL 99 09/30/2022 1055   CL 101 03/09/2014 1533   CO2 27 09/30/2022 1055   CO2 30 03/09/2014 1533   GLUCOSE 180 (H) 09/30/2022 1055   GLUCOSE 95  03/09/2014 1533   BUN 50 (H) 09/30/2022 1055   BUN 40 (H) 02/27/2020 0931    BUN 35 (H) 03/09/2014 1533   CREATININE 8.93 (H) 09/30/2022 1055   CREATININE 1.50 (H) 03/09/2014 1533   CALCIUM 8.2 (L) 09/30/2022 1055   CALCIUM 8.8 03/09/2014 1533   PROT 7.5 07/26/2021 2222   PROT 7.3 03/09/2014 1533   ALBUMIN 3.6 07/26/2021 2222   ALBUMIN 3.5 03/09/2014 1533   AST 17 07/26/2021 2222   AST 16 03/09/2014 1533   ALT 6 07/26/2021 2222   ALT 23 03/09/2014 1533   ALKPHOS 162 (H) 07/26/2021 2222   ALKPHOS 113 03/09/2014 1533   BILITOT 0.8 07/26/2021 2222   BILITOT 0.3 03/09/2014 1533   GFRNONAA 5 (L) 09/30/2022 1055   GFRNONAA 39 (L) 03/09/2014 1533   GFRNONAA 55 (L) 07/31/2013 1521     No results found.     Assessment & Plan:   1. ESRD (end stage renal disease) (HCC) The patient still continues to have some drainage although I think the overlying cellulitis is resolved.  But given the drainage and given the culture results we will send the patient some Cipro for treatment today.  The patient does have an upcoming visit in the next several weeks or less.  She will keep that appointment however if there are issues or problems she will contact us and have her follow-up sooner.  2. Controlled type 2 diabetes mellitus with hyperglycemia, without long-term current use of insulin (HCC) Continue hypoglycemic medications as already ordered, these medications have been reviewed and there are no changes at this time.  Hgb A1C to be monitored as already arranged by primary service  3. Essential hypertension Continue antihypertensive medications as already ordered, these medications have been reviewed and there are no changes at this time.   Current Outpatient Medications on File Prior to Visit  Medication Sig Dispense Refill   acetaminophen (TYLENOL) 325 MG tablet Take 2 tablets (650 mg total) by mouth every 6 (six) hours as needed for mild pain (or Fever >/= 101). (Patient not taking: Reported on 08/23/2022)     albuterol (VENTOLIN HFA) 108 (90 Base) MCG/ACT inhaler  Inhale 2 puffs into the lungs every 6 (six) hours as needed for wheezing or shortness of breath. (Patient not taking: Reported on 08/23/2022) 8.5 g 0   apixaban (ELIQUIS) 5 MG TABS tablet Take 1 tablet (5 mg total) by mouth 2 (two) times daily. 180 tablet 1   atorvastatin (LIPITOR) 40 MG tablet Take 40 mg by mouth daily.     Cholecalciferol (VITAMIN D3) 1.25 MG (50000 UT) CAPS Take 1 capsule by mouth once a week.     cloNIDine (CATAPRES) 0.1 MG tablet Take 1 tablet (0.1 mg total) by mouth 2 (two) times daily. 180 tablet 2   CRANBERRY PO Take 1 capsule by mouth daily.     ergocalciferol (VITAMIN D2) 1.25 MG (50000 UT) capsule Take 1 capsule (50,000 Units total) by mouth once a week. 5 capsule 0   ezetimibe (ZETIA) 10 MG tablet Take 10 mg by mouth daily with supper.     ferric gluconate 250 mg in sodium chloride 0.9 % 250 mL Inject 250 mg into the vein Every Tuesday,Thursday,and Saturday with dialysis.     folic acid (FOLVITE) 1 MG tablet Take 1 tablet (1 mg total) by mouth daily. 30 tablet 0   gabapentin (NEURONTIN) 300 MG capsule Take 1 capsule (300 mg total) by mouth at bedtime. 30 capsule 0  HYDROcodone-acetaminophen (NORCO) 5-325 MG tablet Take 1 tablet by mouth every 6 (six) hours as needed for moderate pain. 30 tablet 0   Lancets (ONETOUCH DELICA PLUS LANCET30G) MISC      levothyroxine (SYNTHROID) 112 MCG tablet Take 1 tablet (112 mcg total) by mouth daily before breakfast. 30 tablet 0   melatonin 5 MG TABS Take 2 tablets (10 mg total) by mouth at bedtime as needed (sleep). 60 tablet 0   metoprolol succinate (TOPROL-XL) 25 MG 24 hr tablet Take 1 tablet (25 mg total) by mouth at bedtime. Take with or immediately following a meal. 90 tablet 3   midodrine (PROAMATINE) 10 MG tablet Take 1 tablet (10 mg total) by mouth Every Tuesday,Thursday,and Saturday with dialysis. 30 tablet 0   ONETOUCH ULTRA test strip      polyethylene glycol powder (GLYCOLAX/MIRALAX) 17 GM/SCOOP powder Take 1 Container by  mouth once.     traMADol (ULTRAM) 50 MG tablet Take 1 tablet (50 mg total) by mouth every 12 (twelve) hours as needed for moderate pain. 30 tablet 0   TRULICITY 1.5 MG/0.5ML SOPN Inject 1.5 mg into the skin every Monday.     VELPHORO 500 MG chewable tablet Chew 1,000 mg by mouth 3 (three) times daily.     No current facility-administered medications on file prior to visit.    There are no Patient Instructions on file for this visit. No follow-ups on file.   Georgiana Spinner, NP

## 2022-10-04 LAB — MISC LABCORP TEST (SEND OUT): LabCorp test name: 8664

## 2022-10-09 ENCOUNTER — Other Ambulatory Visit: Payer: Self-pay | Admitting: *Deleted

## 2022-10-09 DIAGNOSIS — E118 Type 2 diabetes mellitus with unspecified complications: Secondary | ICD-10-CM | POA: Diagnosis not present

## 2022-10-09 DIAGNOSIS — E1142 Type 2 diabetes mellitus with diabetic polyneuropathy: Secondary | ICD-10-CM | POA: Diagnosis not present

## 2022-10-09 DIAGNOSIS — E221 Hyperprolactinemia: Secondary | ICD-10-CM | POA: Diagnosis not present

## 2022-10-09 DIAGNOSIS — I2782 Chronic pulmonary embolism: Secondary | ICD-10-CM

## 2022-10-09 DIAGNOSIS — E039 Hypothyroidism, unspecified: Secondary | ICD-10-CM | POA: Diagnosis not present

## 2022-10-10 MED ORDER — APIXABAN 5 MG PO TABS
5.0000 mg | ORAL_TABLET | Freq: Two times a day (BID) | ORAL | 1 refills | Status: DC
Start: 2022-10-10 — End: 2023-04-11

## 2022-10-11 ENCOUNTER — Telehealth (INDEPENDENT_AMBULATORY_CARE_PROVIDER_SITE_OTHER): Payer: Self-pay

## 2022-10-11 NOTE — Telephone Encounter (Signed)
Patient called stating that she thinks she still has an infection in her arm where her permacath was removed. She stated she has been on two rounds of Abx and she still has drainage. She stated she is unsure what the next move is, but she would like to have something done for it.   Please advise

## 2022-10-13 ENCOUNTER — Ambulatory Visit (INDEPENDENT_AMBULATORY_CARE_PROVIDER_SITE_OTHER): Payer: BC Managed Care – PPO | Admitting: Primary Care

## 2022-10-13 VITALS — BP 108/58 | HR 109 | Temp 98.5°F | Ht 64.0 in | Wt 351.0 lb

## 2022-10-13 DIAGNOSIS — N186 End stage renal disease: Secondary | ICD-10-CM

## 2022-10-13 DIAGNOSIS — G4733 Obstructive sleep apnea (adult) (pediatric): Secondary | ICD-10-CM

## 2022-10-13 DIAGNOSIS — J984 Other disorders of lung: Secondary | ICD-10-CM | POA: Diagnosis not present

## 2022-10-13 MED ORDER — FLUTICASONE-SALMETEROL 100-50 MCG/ACT IN AEPB: 1.0000 | INHALATION_SPRAY | Freq: Two times a day (BID) | RESPIRATORY_TRACT | 1 refills | Status: DC

## 2022-10-13 NOTE — Progress Notes (Unsigned)
@Patient  ID: Nicole Schmidt, female    DOB: 05/28/1965, 57 y.o.   MRN: [id number]  No chief complaint on file.   Referring provider: Alan Mulder, MD  HPI: 57 year old female, never smoked.  Past medical history significant for OSA/OHS, chronic respiratory failure, hx PE.  Patient of Dr. Craige Cotta.   Recent hospital/ED course:  Patient was admitted on 03/28/2021 until 04/15/2021 for debility related to chronic respiratory failure superimposed with diastolic congestive heart failure.  He was previously admitted back in December for management of acute on chronic diastolic heart failure and end-stage renal disease on hemodialysis with permacath placed on 02/25/2021.  Patient presented to [facility] on 03/17/2021 with inability to ambulate and lower extremity swelling.  She became more somnolent unable to follow commands.  ABG showed pH 7.14/PCO2 89/bicarb 30.3 and was placed on BiPAP.  Chest x-ray showed bilateral pulmonary opacities that were nonspecific.  Follow-up chest x-ray on 03/22/2021 showed stable mild pulm edema.  Urine culture was positive for ESBL protease E. coli treated empirically with 1 dose fosfomycin.  Patient was admitted to rehab on 03/28/2021 for inpatient therapies.  She was reevaluated in the emergency room on 04/24/2021 for ACUTE hypoxia/NSTEMI.  Patient family member found her in her wheelchair sleep without trilogy ventilator.  She was confused upon waking.  Family called EMS.  EKG showed A-fib, no ST elevation or T wave inversions.  BG showed elevated CO2 but only slightly, pH was normal.  Patient did not require BiPAP.  Troponins uptrending.  Felt to be demand from acute respiratory failure.  Heparin drip was not started as patient was already on Eliquis and not actively having chest pain. Discharged.   06/06/2021 Patient presents today for hospital follow-up. She is doing well. No acute respiratory symptoms. Her breathing is good. She is compliant with Advair twice daily. She has not  needed to use her Albuterol rescue inhaler in several weeks. She came in with her oxygen but her tanks were empty. She is 95% on RA. Ambulates with walker. Using Trilogy every night and while napping. Finds it much easier to use than BIPAP. She sleeps in a recliner currently. There has only been one time where she took her Trilogy mask off that she is aware of and she was admitted to the ED for 26 hours for possible NSTEMI. She continues dialysis tues, thurs, sat. She thinks they took off 3.5L last dialysis round. She is following fluid restriction.   08/31/2021  Patient presents today for 6-8 week follow-up. She is compliant with Trilogy NIV at night and with naps. She prefers Trilogy NIV to CPAP. She does not have to fight wearing it. Sleeping well at night. Not waking up as frequently. She takes machine with her to dialysis. Breathing is stable. Wearing 2L oxygen as needed with exertion and at night. She has not needed oxygen during the day time recently. She brings rescue inhaler with her everywhere but has not needed to use it. She is doing more in the last week. New cane. Continues Advair 250-28mcg one puffs twice daily and prn albuterol. She will forget to use Advair at times. PFTs in June 2023 showed moderate restrictive lung disease, recommend weight loss. She has been approved for disability. Continues dialysis.    12/07/2021  Patient presents today for regular overview.  She is doing well. No respiratory issues. Tolerating NIV. Wears 2L oxygen at night with Trilogy. She gets winded with exertion d/t afib. She has not needed to use rescue  inhaler.  She continues to get dialysis on Tues, Thursday and Saturday. Planning for fistula placement above the elbow this Friday. She saw cardiology on 12/05/21, they adjusted her medications.   Airview 04/3021-05/14/21 Usage 27/30 days; 90% > 4 hours Average usage 8 hours 21 mins Min EPAP 4; Max EPAP 15cm h20 Min PS 6; Max PS 22 Airleaks median 6.8 (95%-  48L/min) AHI 0.6   Airview download Sept 25-Oct 24 Usage 26/30 days Average usage 4 hours 35 mins Min EPAP 4; Max EPAP 15cm h20 Min PS 6; Max PS 22 Airleaks median 0.8L/min  AHI 0.6  10/13/2022- Interim hx  Patient presents today for 6 months.   Patient is 80% compliant with noninvasive ventilator (mode-iVAPS auto EPAP). Average usage 4 hours 1 minute.  Min EPAP 4-Max EPAP 15. Pressure support 6-22.  Using fullface mask.  Air leaks 18.0 per minute (95%).  Average AHI 0.4  Maintained on Trilogy ventilator At times she falls asleep before putting her mask on and Not getting a good seal on her mask  Receiving dialysis on Tues/Thursday/Saturday; she takes midodrine prior to dialysis days   She fells she may have done better when using Advair, not current on BD regimen  No wheezing, chest tightness Sypnease with exertion   Compliance report 09/13/22-10/12/22  Usage 24/30 days (80%); 198 days ( 60%) Min EPAP 4; Max EPAP 15cm h20 Min PS 6; Max PS 22 Airleaks 18.0L/min (95%) AHI 0.4    Pulmonary function testing: 08/12/21>> FVC 1.89(55%), FEV 1.58 (60%), ratio 84, TLC 85%, DLCO 20.47 (78%)   Allergies  Allergen Reactions   Spironolactone     Hyperkalemia  Pt unaware of this allergy    Immunization History  Administered Date(s) Administered   Influenza-Unspecified 11/05/2018, 12/03/2021   PFIZER(Purple Top)SARS-COV-2 Vaccination 05/19/2019, 06/16/2019, 02/18/2020   Pfizer Covid-19 Vaccine Bivalent Booster 63yrs & up 11/22/2020   Pneumococcal Polysaccharide-23 02/16/2020    Past Medical History:  Diagnosis Date   (HFpEF) heart failure with preserved ejection fraction (HCC)    a. 10/2018 Echo: EF 60-65%, diast dysfxn, RVSP 50.7 mmHg; b. 01/2020 Echo: EF 65-70%, G2DD; c. 01/2021 Echo: EF 60-65%, no rwma, mod LVH. Nl RV fxn, mildly enlarged RV. Mod BAE. Triv MR. d. 05/2021 Echo: EF 55-60; no RWMAs, mild LVH, mild TR.   Acquired hypothyroidism 02/09/2020   Adenoma of left adrenal  gland    Anemia    Aortic atherosclerosis (HCC)    Arthritis    Breast cancer of upper-inner quadrant of right female breast (HCC)    Right breast invasive CA and DCIS , 7 mm T1,N0,M0. Er/PR pos, her 2 negative.  Margins 1 mm.   COPD (chronic obstructive pulmonary disease) (HCC)    Diabetes mellitus type 2 in obese 02/09/2020   Difficult intubation 09/28/2021   a.) mallampati III-IV; BMI >55 kg/m   Diverticulosis    Dyslipidemia 02/09/2020   ESBL (extended spectrum beta-lactamase) producing bacteria infection 03/20/2021   a. urine culture (+) for ESBL producing E.coli and P. mirabilis   ESRD (end stage renal disease) on dialysis (HCC)    a. T-Th-Sat HD initiated 02/2021   Essential hypertension    Gout    History of kidney stones    Long term current use of anticoagulant    a.) apixaban   Menopause    age 32   Morbid obesity (HCC)    OSA treated with BiPAP    a. on nocturnal PAP therapy; Trilogy 100 ventilator   Permanent atrial  fibrillation (HCC)    a. Dx 10/2018 in setting of PNA. b. CHA2DS2-VASc = 7 (sex, HFpEF, HTN, PE x2, aortic plaque, T2DM); c. s/p successful DCCV 11/2018. d. subsequent recurrent Afib; rate/rhythm maintained on oral metoprolol succinate; chronically anticoagulated using standard dose apixaban.   Personal history of radiation therapy    Pulmonary embolism (HCC) 10/2014   a. on chronic apixaban   Restrictive lung disease    Shingles 10/24/2021   Sleep difficulties    a.) takes melatonin   Thyroid goiter     Tobacco History: Social History   Tobacco Use  Smoking Status Never   Passive exposure: Never  Smokeless Tobacco Never   Counseling given: Not Answered   Outpatient Medications Prior to Visit  Medication Sig Dispense Refill   acetaminophen (TYLENOL) 325 MG tablet Take 2 tablets (650 mg total) by mouth every 6 (six) hours as needed for mild pain (or Fever >/= 101). (Patient not taking: Reported on 08/23/2022)     albuterol (VENTOLIN HFA) 108  (90 Base) MCG/ACT inhaler Inhale 2 puffs into the lungs every 6 (six) hours as needed for wheezing or shortness of breath. (Patient not taking: Reported on 08/23/2022) 8.5 g 0   apixaban (ELIQUIS) 5 MG TABS tablet Take 1 tablet (5 mg total) by mouth 2 (two) times daily. 180 tablet 1   atorvastatin (LIPITOR) 40 MG tablet Take 40 mg by mouth daily.     Cholecalciferol (VITAMIN D3) 1.25 MG (50000 UT) CAPS Take 1 capsule by mouth once a week.     ciprofloxacin (CIPRO) 250 MG tablet Take 1 tablet (250 mg total) by mouth 2 (two) times daily. 14 tablet 0   cloNIDine (CATAPRES) 0.1 MG tablet Take 1 tablet (0.1 mg total) by mouth 2 (two) times daily. 180 tablet 2   CRANBERRY PO Take 1 capsule by mouth daily.     ergocalciferol (VITAMIN D2) 1.25 MG (50000 UT) capsule Take 1 capsule (50,000 Units total) by mouth once a week. 5 capsule 0   ezetimibe (ZETIA) 10 MG tablet Take 10 mg by mouth daily with supper.     ferric gluconate 250 mg in sodium chloride 0.9 % 250 mL Inject 250 mg into the vein Every Tuesday,Thursday,and Saturday with dialysis.     folic acid (FOLVITE) 1 MG tablet Take 1 tablet (1 mg total) by mouth daily. 30 tablet 0   gabapentin (NEURONTIN) 300 MG capsule Take 1 capsule (300 mg total) by mouth at bedtime. 30 capsule 0   HYDROcodone-acetaminophen (NORCO) 5-325 MG tablet Take 1 tablet by mouth every 6 (six) hours as needed for moderate pain. 30 tablet 0   Lancets (ONETOUCH DELICA PLUS LANCET30G) MISC      levothyroxine (SYNTHROID) 112 MCG tablet Take 1 tablet (112 mcg total) by mouth daily before breakfast. 30 tablet 0   melatonin 5 MG TABS Take 2 tablets (10 mg total) by mouth at bedtime as needed (sleep). 60 tablet 0   metoprolol succinate (TOPROL-XL) 25 MG 24 hr tablet Take 1 tablet (25 mg total) by mouth at bedtime. Take with or immediately following a meal. 90 tablet 3   midodrine (PROAMATINE) 10 MG tablet Take 1 tablet (10 mg total) by mouth Every Tuesday,Thursday,and Saturday with  dialysis. 30 tablet 0   ONETOUCH ULTRA test strip      polyethylene glycol powder (GLYCOLAX/MIRALAX) 17 GM/SCOOP powder Take 1 Container by mouth once.     traMADol (ULTRAM) 50 MG tablet Take 1 tablet (50 mg total) by mouth  every 12 (twelve) hours as needed for moderate pain. 30 tablet 0   TRULICITY 1.5 MG/0.5ML SOPN Inject 1.5 mg into the skin every Monday.     VELPHORO 500 MG chewable tablet Chew 1,000 mg by mouth 3 (three) times daily.     No facility-administered medications prior to visit.      Review of Systems  Review of Systems   Physical Exam  LMP 07/17/2012  Physical Exam   Lab Results:  CBC    Component Value Date/Time   WBC 11.3 (H) 09/30/2022 1055   RBC 3.54 (L) 09/30/2022 1055   HGB 12.4 09/30/2022 1055   HGB 9.9 (L) 02/27/2020 0931   HCT 41.0 09/30/2022 1055   HCT 32.2 (L) 02/27/2020 0931   PLT 156 09/30/2022 1055   PLT 232 02/27/2020 0931   MCV 115.8 (H) 09/30/2022 1055   MCV 89 02/27/2020 0931   MCV 90 03/09/2014 1533   MCH 35.0 (H) 09/30/2022 1055   MCHC 30.2 09/30/2022 1055   RDW 16.0 (H) 09/30/2022 1055   RDW 13.0 02/27/2020 0931   RDW 15.2 (H) 03/09/2014 1533   LYMPHSABS 1.8 04/21/2022 1109   LYMPHSABS 1.5 03/09/2014 1533   MONOABS 0.8 04/21/2022 1109   MONOABS 0.6 03/09/2014 1533   EOSABS 0.2 04/21/2022 1109   EOSABS 0.2 03/09/2014 1533   BASOSABS 0.0 04/21/2022 1109   BASOSABS 0.1 03/09/2014 1533    BMET    Component Value Date/Time   NA 136 09/30/2022 1055   NA 144 02/27/2020 0931   NA 141 03/09/2014 1533   K 5.0 09/30/2022 1055   K 4.2 03/09/2014 1533   CL 99 09/30/2022 1055   CL 101 03/09/2014 1533   CO2 27 09/30/2022 1055   CO2 30 03/09/2014 1533   GLUCOSE 180 (H) 09/30/2022 1055   GLUCOSE 95 03/09/2014 1533   BUN 50 (H) 09/30/2022 1055   BUN 40 (H) 02/27/2020 0931   BUN 35 (H) 03/09/2014 1533   CREATININE 8.93 (H) 09/30/2022 1055   CREATININE 1.50 (H) 03/09/2014 1533   CALCIUM 8.2 (L) 09/30/2022 1055   CALCIUM 8.8  03/09/2014 1533   GFRNONAA 5 (L) 09/30/2022 1055   GFRNONAA 39 (L) 03/09/2014 1533   GFRNONAA 55 (L) 07/31/2013 1521   GFRAA 35 (L) 02/27/2020 0931   GFRAA 47 (L) 03/09/2014 1533   GFRAA >60 07/31/2013 1521    BNP    Component Value Date/Time   BNP 196.5 (H) 06/06/2021 1329    ProBNP No results found for: "PROBNP"  Imaging: DG Chest Port 1 View  Result Date: 09/30/2022 CLINICAL DATA:  Shortness of breath. EXAM: PORTABLE CHEST 1 VIEW COMPARISON:  Chest radiograph 04/24/2021. FINDINGS: 1105 hours. Low lung volumes accentuate the pulmonary vasculature and cardiomediastinal silhouette. Probable superimposed pulmonary venous congestion without overt pulmonary edema. No pleural effusion or pneumothorax. IMPRESSION: Low lung volumes with probable superimposed pulmonary venous congestion without overt pulmonary edema. Electronically Signed   By: Orvan Falconer M.D.   On: 09/30/2022 11:21     Assessment & Plan:   No problem-specific Assessment & Plan notes found for this encounter.     Glenford Bayley, NP 10/13/2022

## 2022-10-13 NOTE — Telephone Encounter (Signed)
Have her come in to see me or GS

## 2022-10-13 NOTE — Patient Instructions (Signed)
Recommendations  Continue to wear Non-invasive ventilator at night- aim to wear every night 4 hours or longer Re-trial Advair 143mcg-50mg  one puff twice daily (rinse mouth after use) Use Albuterol 2 puffs every 4-6 hours as needed for breakthrough shortness of breath  Follow-up 6 weeks with Waynetta Sandy NP/ virtual visit re: new inhaler start

## 2022-10-14 NOTE — Assessment & Plan Note (Signed)
-   PFTs in June 2023 showed moderate restrictive lung disease. She has dyspnea symptoms with exertion. No overt cough or wheezing symptoms. Not convinced she has true asthma but she reports previous clinical benefit from Advair. Re-trial Advair 100-11mcg one puff twice daily. FU in 6 weeks to assess clinical response.

## 2022-10-14 NOTE — Assessment & Plan Note (Signed)
-   Receiving HD on Tues/Thursday/Saturday; she takes midodrine prior to dialysis days

## 2022-10-14 NOTE — Assessment & Plan Note (Signed)
-   OSA/OHS. Prefers NIV vs CPAP. Well controlled on non-invasive ventilation. She is 80% compliant with usage. Average usage 4 hours 1 min. Min EPAP 4; Max EPAP 15. Min PS 6; Max PS 22. Airleaks 18L/min (95%). Residual AHI 0.4/hour.

## 2022-10-16 NOTE — Progress Notes (Signed)
Reviewed and agree with assessment/plan.   Coralyn Helling, MD [facility] Pulmonary/Critical Care 10/16/2022, 7:06 AM Pager:  [phone]

## 2022-10-18 ENCOUNTER — Encounter (INDEPENDENT_AMBULATORY_CARE_PROVIDER_SITE_OTHER): Payer: Self-pay | Admitting: Nurse Practitioner

## 2022-10-18 ENCOUNTER — Ambulatory Visit (INDEPENDENT_AMBULATORY_CARE_PROVIDER_SITE_OTHER): Payer: BC Managed Care – PPO | Admitting: Nurse Practitioner

## 2022-10-18 ENCOUNTER — Other Ambulatory Visit (INDEPENDENT_AMBULATORY_CARE_PROVIDER_SITE_OTHER): Payer: Self-pay | Admitting: Nurse Practitioner

## 2022-10-18 VITALS — BP 112/75 | HR 101 | Resp 18 | Ht 61.5 in | Wt 348.2 lb

## 2022-10-18 DIAGNOSIS — N186 End stage renal disease: Secondary | ICD-10-CM | POA: Diagnosis not present

## 2022-10-18 DIAGNOSIS — E1165 Type 2 diabetes mellitus with hyperglycemia: Secondary | ICD-10-CM

## 2022-10-18 DIAGNOSIS — T8149XA Infection following a procedure, other surgical site, initial encounter: Secondary | ICD-10-CM | POA: Diagnosis not present

## 2022-10-18 DIAGNOSIS — I10 Essential (primary) hypertension: Secondary | ICD-10-CM

## 2022-10-18 NOTE — Progress Notes (Signed)
Subjective:    Patient ID: Nicole Schmidt, female    DOB: 05/08/1965, 57 y.o.   MRN: [id number] Chief Complaint  Patient presents with   Follow-up    per phone message bring in to be seen by gs/fb    The patient returns today for evaluation of the wound from her right chest due to PermCath removal.  She had a PermCath in her right chest for an extended amount of time.  It was recently removed.  Following removal she woke up 1 evening with significant foul-smelling drainage from the wound.  There was concern for infection so she presented to the emergency room.  She was diagnosed with cellulitis and there was further asked for her to go to she was placed on antibiotics.  At her last follow-up the drainage had decreased she was placed on oral antibiotics.  However she noted that she continued to have drainage.  She notes that the drainage ranges from pink to light Nicole Schmidt.  There is no foul-smelling odor.  She has recently received vancomycin at dialysis.    Review of Systems  Skin:  Positive for wound.  Hematological:  Bruises/bleeds easily.  All other systems reviewed and are negative.      Objective:   Physical Exam Vitals reviewed.  HENT:     Head: Normocephalic.  Cardiovascular:     Rate and Rhythm: Normal rate.  Pulmonary:     Effort: Pulmonary effort is normal.  Skin:    General: Skin is warm and dry.  Neurological:     Mental Status: She is alert and oriented to person, place, and time.  Psychiatric:        Mood and Affect: Mood normal.        Behavior: Behavior normal.        Thought Content: Thought content normal.        Judgment: Judgment normal.     BP 112/75 (BP Location: Right Wrist)   Pulse (!) 101   Resp 18   Ht 5' 1.5" (1.562 m)   Wt (!) 348 lb 3.2 oz (157.9 kg)   LMP 07/17/2012   BMI 64.73 kg/m   Past Medical History:  Diagnosis Date   (HFpEF) heart failure with preserved ejection fraction (HCC)    a. 10/2018 Echo: EF 60-65%, diast dysfxn, RVSP 50.7  mmHg; b. 01/2020 Echo: EF 65-70%, G2DD; c. 01/2021 Echo: EF 60-65%, no rwma, mod LVH. Nl RV fxn, mildly enlarged RV. Mod BAE. Triv MR. d. 05/2021 Echo: EF 55-60; no RWMAs, mild LVH, mild TR.   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 Er/PR pos, her 2 negative.  Margins 1 mm.   COPD (chronic obstructive pulmonary disease) (HCC)    Diabetes mellitus type 2 in obese 02/09/2020   Difficult intubation 09/28/2021   a.) mallampati III-IV; BMI >55 kg/m   Diverticulosis    Dyslipidemia 02/09/2020   ESBL (extended spectrum beta-lactamase) producing bacteria infection 03/20/2021   a. urine culture (+) for ESBL producing E.coli and P. mirabilis   ESRD (end stage renal disease) on dialysis (HCC)    a. T-Th-Sat HD initiated 02/2021   Essential hypertension    Gout    History of kidney stones    Long term  current use of anticoagulant    a.) apixaban   Menopause    age 51   Morbid obesity (HCC)    OSA treated with BiPAP    a. on nocturnal PAP therapy; Trilogy 100 ventilator   Permanent atrial fibrillation (HCC)    a. Dx 10/2018 in setting of PNA. b. CHA2DS2-VASc = 7 (sex, HFpEF, HTN, PE x2, aortic plaque, T2DM); c. s/p successful DCCV 11/2018. d. subsequent recurrent Afib; rate/rhythm maintained on oral metoprolol succinate; chronically anticoagulated using standard dose apixaban.   Personal history of radiation therapy    Pulmonary embolism (HCC) 10/2014   a. on chronic apixaban   Restrictive lung disease    Shingles 10/24/2021   Sleep difficulties    a.) takes melatonin   Thyroid goiter     Social History   Socioeconomic History   Marital status: Single    Spouse name: Not on file   Number of children: Not on file   Years of education: Not on file   Highest education level: Not on  file  Occupational History   Occupation: retired  Tobacco Use   Smoking status: Never    Passive exposure: Never   Smokeless tobacco: Never  Vaping Use   Vaping status: Never Used  Substance and Sexual Activity   Alcohol use: No    Alcohol/week: 0.0 standard drinks of alcohol   Drug use: No   Sexual activity: Not on file  Other Topics Concern   Not on file  Social History Narrative   Lives in [place] by herself and retired.    Social Determinants of Health   Financial Resource Strain: Unknown (11/15/2017)   Overall Financial Resource Strain (CARDIA)    Difficulty of Paying Living Expenses: Patient declined  Food Insecurity: Unknown (11/15/2017)   Hunger Vital Sign    Worried About Running Out of Food in the Last Year: Patient declined    Ran Out of Food in the Last Year: Patient declined  Transportation Needs: Unknown (11/15/2017)   PRAPARE - Administrator, Civil Service (Medical): Patient declined    Lack of Transportation (Non-Medical): Patient declined  Physical Activity: Unknown (11/15/2017)   Exercise Vital Sign    Days of Exercise per Week: Patient declined    Minutes of Exercise per Session: Patient declined  Stress: Unknown (11/15/2017)   [employer] of Occupational Health - Occupational Stress Questionnaire    Feeling of Stress : Patient declined  Social Connections: Unknown (11/15/2017)   Social Connection and Isolation Panel [NHANES]    Frequency of Communication with Friends and Family: Patient declined    Frequency of Social Gatherings with Friends and Family: Patient declined    Attends Religious Services: Patient declined    Active Member of Clubs or Organizations: Patient declined    Attends Banker Meetings: Patient declined    Marital Status: Patient declined  Intimate Partner Violence: Unknown (11/15/2017)   Humiliation, Afraid, Rape, and Kick questionnaire    Fear of Current or Ex-Partner: Patient declined    Emotionally  Abused: Patient declined    Physically Abused: Patient declined    Sexually Abused: Patient declined    Past Surgical History:  Procedure Laterality Date   A/V FISTULAGRAM Left 09/02/2021   Procedure: A/V Fistulagram;  Surgeon: Gilda Crease Latina Craver, MD;  Location: [facility] INVASIVE CV LAB;  Service: Cardiovascular;  Laterality: Left;   A/V FISTULAGRAM Left 06/27/2022   Procedure: A/V Fistulagram;  Surgeon: Renford Dills, MD;  Location: [facility] INVASIVE  CV LAB;  Service: Cardiovascular;  Laterality: Left;   AV FISTULA PLACEMENT Left 07/22/2021   Procedure: ARTERIOVENOUS (AV) FISTULA CREATION (RADIALCEPHALIC);  Surgeon: Renford Dills, MD;  Location: [facility] ORS;  Service: Vascular;  Laterality: Left;   AV FISTULA PLACEMENT Left 12/09/2021   Procedure: ARTERIOVENOUS (AV) FISTULA CREATION (BRACHIAL CEPHALIC);  Surgeon: Renford Dills, MD;  Location: [facility] ORS;  Service: Vascular;  Laterality: Left;   BREAST BIOPSY Right 2014   breast ca   BREAST EXCISIONAL BIOPSY Right 07/19/2012   breast ca   BREAST SURGERY Right 2014   with sentinel node bx subareaolar duct excision   CARDIOVERSION N/A 11/28/2018   Procedure: CARDIOVERSION;  Surgeon: Antonieta Iba, MD;  Location: [facility] ORS;  Service: Cardiovascular;  Laterality: N/A;   COLONOSCOPY WITH PROPOFOL N/A 01/30/2017   Procedure: COLONOSCOPY WITH PROPOFOL;  Surgeon: Kieth Brightly, MD;  Location: [facility] ENDOSCOPY;  Service: Endoscopy;  Laterality: N/A;   CYSTOSCOPY W/ RETROGRADES  06/17/2021   Procedure: CYSTOSCOPY WITH RETROGRADE PYELOGRAM;  Surgeon: Sondra Come, MD;  Location: [facility] ORS;  Service: Urology;;   CYSTOSCOPY W/ URETERAL STENT PLACEMENT Left 02/14/2021   Procedure: CYSTOSCOPY WITH RETROGRADE PYELOGRAM/URETERAL STENT PLACEMENT;  Surgeon: Sondra Come, MD;  Location: [facility] ORS;  Service: Urology;  Laterality: Left;   CYSTOSCOPY/URETEROSCOPY/HOLMIUM LASER/STENT PLACEMENT Left 06/17/2021   Procedure:  CYSTOSCOPY/URETEROSCOPY/HOLMIUM LASER/STENT EXCHANGE;  Surgeon: Sondra Come, MD;  Location: [facility] ORS;  Service: Urology;  Laterality: Left;   DIALYSIS/PERMA CATHETER INSERTION N/A 02/24/2021   Procedure: DIALYSIS/PERMA CATHETER INSERTION;  Surgeon: Annice Needy, MD;  Location: [facility] INVASIVE CV LAB;  Service: Cardiovascular;  Laterality: N/A;   DIALYSIS/PERMA CATHETER INSERTION N/A 12/16/2021   Procedure: DIALYSIS/PERMA CATHETER INSERTION;  Surgeon: Renford Dills, MD;  Location: [facility] INVASIVE CV LAB;  Service: Cardiovascular;  Laterality: N/A;   DIALYSIS/PERMA CATHETER REMOVAL N/A 09/04/2022   Procedure: DIALYSIS/PERMA CATHETER REMOVAL;  Surgeon: Annice Needy, MD;  Location: [facility] INVASIVE CV LAB;  Service: Cardiovascular;  Laterality: N/A;   PERIPHERAL VASCULAR THROMBECTOMY Left 09/28/2021   Procedure: PERIPHERAL VASCULAR THROMBECTOMY;  Surgeon: Renford Dills, MD;  Location: [facility] INVASIVE CV LAB;  Service: Cardiovascular;  Laterality: Left;   REVISON OF ARTERIOVENOUS FISTULA Left 04/28/2022   Procedure: REVISON OF ARTERIOVENOUS FISTULA (BRACHIAL CEPHALIC);  Surgeon: Renford Dills, MD;  Location: [facility] ORS;  Service: Vascular;  Laterality: Left;    Family History  Problem Relation Age of Onset   Diabetes Father    Hypertension Father    Parkinson's disease Father    Hypertension Mother    Rheum arthritis Mother    Atrial fibrillation Mother    Heart failure Mother    Heart attack Mother        died in her mid-70's.   Colon cancer Cousin 54   Breast cancer Neg Hx     Allergies  Allergen Reactions   Spironolactone     Hyperkalemia  Pt unaware of this allergy       Latest Ref Rng & Units 09/30/2022   10:55 AM 04/28/2022    6:39 AM 04/21/2022   11:09 AM  CBC  WBC 4.0 - 10.5 K/uL 11.3   8.4   Hemoglobin 12.0 - 15.0 g/dL 82.9  56.2  13.0   Hematocrit 36.0 - 46.0 % 41.0  39.0  40.0   Platelets 150 - 400 K/uL 156   196       CMP     Component Value Date/Time   NA  136 09/30/2022 1055   NA 144 02/27/2020 0931   NA 141 03/09/2014 1533   K 5.0 09/30/2022 1055   K 4.2 03/09/2014 1533   CL 99 09/30/2022 1055   CL 101 03/09/2014 1533   CO2 27 09/30/2022 1055   CO2 30 03/09/2014 1533   GLUCOSE 180 (H) 09/30/2022 1055   GLUCOSE 95 03/09/2014 1533   BUN 50 (H) 09/30/2022 1055   BUN 40 (H) 02/27/2020 0931   BUN 35 (H) 03/09/2014 1533   CREATININE 8.93 (H) 09/30/2022 1055   CREATININE 1.50 (H) 03/09/2014 1533   CALCIUM 8.2 (L) 09/30/2022 1055   CALCIUM 8.8 03/09/2014 1533   PROT 7.5 07/26/2021 2222   PROT 7.3 03/09/2014 1533   ALBUMIN 3.6 07/26/2021 2222   ALBUMIN 3.5 03/09/2014 1533   AST 17 07/26/2021 2222   AST 16 03/09/2014 1533   ALT 6 07/26/2021 2222   ALT 23 03/09/2014 1533   ALKPHOS 162 (H) 07/26/2021 2222   ALKPHOS 113 03/09/2014 1533   BILITOT 0.8 07/26/2021 2222   BILITOT 0.3 03/09/2014 1533   GFRNONAA 5 (L) 09/30/2022 1055   GFRNONAA 39 (L) 03/09/2014 1533   GFRNONAA 55 (L) 07/31/2013 1521     No results found.     Assessment & Plan:   1. ESRD (end stage renal disease) (HCC) Because the patient has received vancomycin at dialysis I will not send in antibiotics today for a wound culture.  Sure we do not have any other regarding infection not treated by vancomycin or by the previous antibiotic sent in.  I suspect the reason she continues to have oozing is because she still has a little bit of an open area.  We have instructed the patient placed Aquacel to be changed daily on the wound.  Will reevaluate in 2 weeks  2. Controlled type 2 diabetes mellitus with hyperglycemia, without long-term current use of insulin (HCC) Continue hypoglycemic medications as already ordered, these medications have been reviewed and there are no changes at this time.  Hgb A1C to be monitored as already arranged by primary service  3. Essential hypertension Continue antihypertensive medications as already ordered, these medications have been reviewed  and there are no changes at this time.   Current Outpatient Medications on File Prior to Visit  Medication Sig Dispense Refill   apixaban (ELIQUIS) 5 MG TABS tablet Take 1 tablet (5 mg total) by mouth 2 (two) times daily. 180 tablet 1   atorvastatin (LIPITOR) 40 MG tablet Take 40 mg by mouth daily.     Cholecalciferol (VITAMIN D3) 1.25 MG (50000 UT) CAPS Take 1 capsule by mouth once a week.     ciprofloxacin (CIPRO) 250 MG tablet Take 1 tablet (250 mg total) by mouth 2 (two) times daily. 14 tablet 0   cloNIDine (CATAPRES) 0.1 MG tablet Take 1 tablet (0.1 mg total) by mouth 2 (two) times daily. 180 tablet 2   CRANBERRY PO Take 1 capsule by mouth daily.     ezetimibe (ZETIA) 10 MG tablet Take 10 mg by mouth daily with supper.     ferric gluconate 250 mg in sodium chloride 0.9 % 250 mL Inject 250 mg into the vein Every Tuesday,Thursday,and Saturday with dialysis.     fluticasone-salmeterol (ADVAIR DISKUS) 100-50 MCG/ACT AEPB Inhale 1 puff into the lungs 2 (two) times daily. 60 each 1   folic acid (FOLVITE) 1 MG tablet Take 1 tablet (1 mg total) by mouth daily. 30 tablet 0   gabapentin (NEURONTIN)  300 MG capsule Take 1 capsule (300 mg total) by mouth at bedtime. 30 capsule 0   HYDROcodone-acetaminophen (NORCO) 5-325 MG tablet Take 1 tablet by mouth every 6 (six) hours as needed for moderate pain. 30 tablet 0   Lancets (ONETOUCH DELICA PLUS LANCET30G) MISC      levothyroxine (SYNTHROID) 112 MCG tablet Take 1 tablet (112 mcg total) by mouth daily before breakfast. 30 tablet 0   melatonin 5 MG TABS Take 2 tablets (10 mg total) by mouth at bedtime as needed (sleep). 60 tablet 0   metoprolol succinate (TOPROL-XL) 25 MG 24 hr tablet Take 1 tablet (25 mg total) by mouth at bedtime. Take with or immediately following a meal. 90 tablet 3   midodrine (PROAMATINE) 10 MG tablet Take 1 tablet (10 mg total) by mouth Every Tuesday,Thursday,and Saturday with dialysis. 30 tablet 0   ONETOUCH ULTRA test strip       polyethylene glycol powder (GLYCOLAX/MIRALAX) 17 GM/SCOOP powder Take 1 Container by mouth once.     traMADol (ULTRAM) 50 MG tablet Take 1 tablet (50 mg total) by mouth every 12 (twelve) hours as needed for moderate pain. 30 tablet 0   TRULICITY 1.5 MG/0.5ML SOPN Inject 1.5 mg into the skin every Monday.     VELPHORO 500 MG chewable tablet Chew 1,000 mg by mouth 3 (three) times daily.     acetaminophen (TYLENOL) 325 MG tablet Take 2 tablets (650 mg total) by mouth every 6 (six) hours as needed for mild pain (or Fever >/= 101). (Patient not taking: Reported on 08/23/2022)     albuterol (VENTOLIN HFA) 108 (90 Base) MCG/ACT inhaler Inhale 2 puffs into the lungs every 6 (six) hours as needed for wheezing or shortness of breath. (Patient not taking: Reported on 10/18/2022) 8.5 g 0   ergocalciferol (VITAMIN D2) 1.25 MG (50000 UT) capsule Take 1 capsule (50,000 Units total) by mouth once a week. (Patient not taking: Reported on 10/13/2022) 5 capsule 0   No current facility-administered medications on file prior to visit.    There are no Patient Instructions on file for this visit. No follow-ups on file.   Georgiana Spinner, NP

## 2022-10-20 DIAGNOSIS — E662 Morbid (severe) obesity with alveolar hypoventilation: Secondary | ICD-10-CM | POA: Diagnosis not present

## 2022-10-23 LAB — AEROBIC CULTURE

## 2022-10-24 DIAGNOSIS — I5032 Chronic diastolic (congestive) heart failure: Secondary | ICD-10-CM | POA: Diagnosis not present

## 2022-10-24 DIAGNOSIS — J9601 Acute respiratory failure with hypoxia: Secondary | ICD-10-CM | POA: Diagnosis not present

## 2022-10-24 DIAGNOSIS — I482 Chronic atrial fibrillation, unspecified: Secondary | ICD-10-CM | POA: Diagnosis not present

## 2022-10-24 DIAGNOSIS — G4733 Obstructive sleep apnea (adult) (pediatric): Secondary | ICD-10-CM | POA: Diagnosis not present

## 2022-11-03 ENCOUNTER — Ambulatory Visit (INDEPENDENT_AMBULATORY_CARE_PROVIDER_SITE_OTHER): Payer: BC Managed Care – PPO | Admitting: Nurse Practitioner

## 2022-11-03 VITALS — BP 116/70 | HR 105 | Resp 20 | Ht 61.0 in | Wt 347.6 lb

## 2022-11-03 DIAGNOSIS — E1140 Type 2 diabetes mellitus with diabetic neuropathy, unspecified: Secondary | ICD-10-CM | POA: Diagnosis not present

## 2022-11-03 DIAGNOSIS — N186 End stage renal disease: Secondary | ICD-10-CM

## 2022-11-03 DIAGNOSIS — E1165 Type 2 diabetes mellitus with hyperglycemia: Secondary | ICD-10-CM | POA: Diagnosis not present

## 2022-11-03 DIAGNOSIS — L600 Ingrowing nail: Secondary | ICD-10-CM | POA: Diagnosis not present

## 2022-11-03 DIAGNOSIS — I10 Essential (primary) hypertension: Secondary | ICD-10-CM | POA: Diagnosis not present

## 2022-11-03 DIAGNOSIS — B351 Tinea unguium: Secondary | ICD-10-CM | POA: Diagnosis not present

## 2022-11-06 ENCOUNTER — Encounter (INDEPENDENT_AMBULATORY_CARE_PROVIDER_SITE_OTHER): Payer: Self-pay | Admitting: Nurse Practitioner

## 2022-11-06 NOTE — Progress Notes (Signed)
Subjective:    Patient ID: Nicole Schmidt, female    DOB: 02-26-1965, 57 y.o.   MRN: [id number] Chief Complaint  Patient presents with   Venous Insufficiency    The patient returns today for evaluation of the wound from her right chest due to PermCath removal.  She had a PermCath in her right chest for an extended amount of time.  It was recently removed.  Following removal she woke up 1 evening with significant foul-smelling drainage from the wound.  There was concern for infection so she presented to the emergency room.  She was diagnosed with cellulitis and there was further asked for her to go to she was placed on antibiotics.  At her last follow-up the drainage had decreased she was placed on oral antibiotics.  However she noted that she continued to have drainage.  Previously was very shallow but today it is more beefy red and appears to be a little bit longer bloody, despite regular dressing changes: No    Review of Systems  Skin:  Positive for wound.  Hematological:  Bruises/bleeds easily.  All other systems reviewed and are negative.      Objective:   Physical Exam Vitals reviewed.  HENT:     Head: Normocephalic.  Cardiovascular:     Rate and Rhythm: Normal rate.  Pulmonary:     Effort: Pulmonary effort is normal.  Skin:    General: Skin is warm and dry.  Neurological:     Mental Status: She is alert and oriented to person, place, and time.  Psychiatric:        Mood and Affect: Mood normal.        Behavior: Behavior normal.        Thought Content: Thought content normal.        Judgment: Judgment normal.     BP 116/70 (BP Location: Right Arm)   Pulse (!) 105   Resp 20   Ht 5\' 1"  (1.549 m)   Wt (!) 347 lb 9.6 oz (157.7 kg)   LMP 07/17/2012   BMI 65.68 kg/m   Past Medical History:  Diagnosis Date   (HFpEF) heart failure with preserved ejection fraction (HCC)    a. 10/2018 Echo: EF 60-65%, diast dysfxn, RVSP 50.7 mmHg; b. 01/2020 Echo: EF 65-70%, G2DD; c.  01/2021 Echo: EF 60-65%, no rwma, mod LVH. Nl RV fxn, mildly enlarged RV. Mod BAE. Triv MR. d. 05/2021 Echo: EF 55-60; no RWMAs, mild LVH, mild TR.   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 Er/PR pos, her 2 negative.  Margins 1 mm.   COPD (chronic obstructive pulmonary disease) (HCC)    Diabetes mellitus type 2 in obese 02/09/2020   Difficult intubation 09/28/2021   a.) mallampati III-IV; BMI >55 kg/m   Diverticulosis    Dyslipidemia 02/09/2020   ESBL (extended spectrum beta-lactamase) producing bacteria infection 03/20/2021   a. urine culture (+) for ESBL producing E.coli and P. mirabilis   ESRD (end stage renal disease) on dialysis (HCC)    a. T-Th-Sat HD initiated 02/2021   Essential hypertension    Gout    History of kidney stones    Long term current use of anticoagulant    a.) apixaban   Menopause  age 62   Morbid obesity (HCC)    OSA treated with BiPAP    a. on nocturnal PAP therapy; Trilogy 100 ventilator   Permanent atrial fibrillation (HCC)    a. Dx 10/2018 in setting of PNA. b. CHA2DS2-VASc = 7 (sex, HFpEF, HTN, PE x2, aortic plaque, T2DM); c. s/p successful DCCV 11/2018. d. subsequent recurrent Afib; rate/rhythm maintained on oral metoprolol succinate; chronically anticoagulated using standard dose apixaban.   Personal history of radiation therapy    Pulmonary embolism (HCC) 10/2014   a. on chronic apixaban   Restrictive lung disease    Shingles 10/24/2021   Sleep difficulties    a.) takes melatonin   Thyroid goiter     Social History   Socioeconomic History   Marital status: Single    Spouse name: Not on file   Number of children: Not on file   Years of education: Not on file   Highest education level: Not on file  Occupational History   Occupation:  retired  Tobacco Use   Smoking status: Never    Passive exposure: Never   Smokeless tobacco: Never  Vaping Use   Vaping status: Never Used  Substance and Sexual Activity   Alcohol use: No    Alcohol/week: 0.0 standard drinks of alcohol   Drug use: No   Sexual activity: Not on file  Other Topics Concern   Not on file  Social History Narrative   Lives in [place] by herself and retired.    Social Determinants of Health   Financial Resource Strain: Unknown (11/15/2017)   Overall Financial Resource Strain (CARDIA)    Difficulty of Paying Living Expenses: Patient declined  Food Insecurity: Unknown (11/15/2017)   Hunger Vital Sign    Worried About Running Out of Food in the Last Year: Patient declined    Ran Out of Food in the Last Year: Patient declined  Transportation Needs: Unknown (11/15/2017)   PRAPARE - Administrator, Civil Service (Medical): Patient declined    Lack of Transportation (Non-Medical): Patient declined  Physical Activity: Unknown (11/15/2017)   Exercise Vital Sign    Days of Exercise per Week: Patient declined    Minutes of Exercise per Session: Patient declined  Stress: Unknown (11/15/2017)   [employer] of Occupational Health - Occupational Stress Questionnaire    Feeling of Stress : Patient declined  Social Connections: Unknown (11/15/2017)   Social Connection and Isolation Panel [NHANES]    Frequency of Communication with Friends and Family: Patient declined    Frequency of Social Gatherings with Friends and Family: Patient declined    Attends Religious Services: Patient declined    Active Member of Clubs or Organizations: Patient declined    Attends Banker Meetings: Patient declined    Marital Status: Patient declined  Intimate Partner Violence: Unknown (11/15/2017)   Humiliation, Afraid, Rape, and Kick questionnaire    Fear of Current or Ex-Partner: Patient declined    Emotionally Abused: Patient declined    Physically  Abused: Patient declined    Sexually Abused: Patient declined    Past Surgical History:  Procedure Laterality Date   A/V FISTULAGRAM Left 09/02/2021   Procedure: A/V Fistulagram;  Surgeon: Gilda Crease Latina Craver, MD;  Location: [facility];  Service: Cardiovascular;  Laterality: Left;   A/V FISTULAGRAM Left 06/27/2022   Procedure: A/V Fistulagram;  Surgeon: Renford Dills, MD;  Location: [facility];  Service: Cardiovascular;  Laterality: Left;   AV FISTULA PLACEMENT Left 07/22/2021  Procedure: ARTERIOVENOUS (AV) FISTULA CREATION (RADIALCEPHALIC);  Surgeon: Renford Dills, MD;  Location: [facility];  Service: Vascular;  Laterality: Left;   AV FISTULA PLACEMENT Left 12/09/2021   Procedure: ARTERIOVENOUS (AV) FISTULA CREATION (BRACHIAL CEPHALIC);  Surgeon: Renford Dills, MD;  Location: [facility];  Service: Vascular;  Laterality: Left;   BREAST BIOPSY Right 2014   breast ca   BREAST EXCISIONAL BIOPSY Right 07/19/2012   breast ca   BREAST SURGERY Right 2014   with sentinel node bx subareaolar duct excision   CARDIOVERSION N/A 11/28/2018   Procedure: CARDIOVERSION;  Surgeon: Antonieta Iba, MD;  Location: [facility];  Service: Cardiovascular;  Laterality: N/A;   COLONOSCOPY WITH PROPOFOL N/A 01/30/2017   Procedure: COLONOSCOPY WITH PROPOFOL;  Surgeon: Kieth Brightly, MD;  Location: [facility];  Service: Endoscopy;  Laterality: N/A;   CYSTOSCOPY W/ RETROGRADES  06/17/2021   Procedure: CYSTOSCOPY WITH RETROGRADE PYELOGRAM;  Surgeon: Sondra Come, MD;  Location: [facility];  Service: Urology;;   CYSTOSCOPY W/ URETERAL STENT PLACEMENT Left 02/14/2021   Procedure: CYSTOSCOPY WITH RETROGRADE PYELOGRAM/URETERAL STENT PLACEMENT;  Surgeon: Sondra Come, MD;  Location: [facility];  Service: Urology;  Laterality: Left;   CYSTOSCOPY/URETEROSCOPY/HOLMIUM LASER/STENT PLACEMENT Left 06/17/2021   Procedure: CYSTOSCOPY/URETEROSCOPY/HOLMIUM LASER/STENT EXCHANGE;  Surgeon:  Sondra Come, MD;  Location: [facility];  Service: Urology;  Laterality: Left;   DIALYSIS/PERMA CATHETER INSERTION N/A 02/24/2021   Procedure: DIALYSIS/PERMA CATHETER INSERTION;  Surgeon: Annice Needy, MD;  Location: [facility];  Service: Cardiovascular;  Laterality: N/A;   DIALYSIS/PERMA CATHETER INSERTION N/A 12/16/2021   Procedure: DIALYSIS/PERMA CATHETER INSERTION;  Surgeon: Renford Dills, MD;  Location: [facility];  Service: Cardiovascular;  Laterality: N/A;   DIALYSIS/PERMA CATHETER REMOVAL N/A 09/04/2022   Procedure: DIALYSIS/PERMA CATHETER REMOVAL;  Surgeon: Annice Needy, MD;  Location: [facility];  Service: Cardiovascular;  Laterality: N/A;   PERIPHERAL VASCULAR THROMBECTOMY Left 09/28/2021   Procedure: PERIPHERAL VASCULAR THROMBECTOMY;  Surgeon: Renford Dills, MD;  Location: [facility];  Service: Cardiovascular;  Laterality: Left;   REVISON OF ARTERIOVENOUS FISTULA Left 04/28/2022   Procedure: REVISON OF ARTERIOVENOUS FISTULA (BRACHIAL CEPHALIC);  Surgeon: Renford Dills, MD;  Location: [facility];  Service: Vascular;  Laterality: Left;    Family History  Problem Relation Age of Onset   Diabetes Father    Hypertension Father    Parkinson's disease Father    Hypertension Mother    Rheum arthritis Mother    Atrial fibrillation Mother    Heart failure Mother    Heart attack Mother        died in her mid-70's.   Colon cancer Cousin 54   Breast cancer Neg Hx     Allergies  Allergen Reactions   Spironolactone     Hyperkalemia  Pt unaware of this allergy       Latest Ref Rng & Units 09/30/2022   10:55 AM 04/28/2022    6:39 AM 04/21/2022   11:09 AM  CBC  WBC 4.0 - 10.5 K/uL 11.3   8.4   Hemoglobin 12.0 - 15.0 g/dL 95.6  21.3  08.6   Hematocrit 36.0 - 46.0 % 41.0  39.0  40.0   Platelets 150 - 400 K/uL 156   196       CMP     Component Value Date/Time   NA 136 09/30/2022 1055   NA 144 02/27/2020 0931   NA 141  03/09/2014 1533  K 5.0 09/30/2022 1055   K 4.2 03/09/2014 1533   CL 99 09/30/2022 1055   CL 101 03/09/2014 1533   CO2 27 09/30/2022 1055   CO2 30 03/09/2014 1533   GLUCOSE 180 (H) 09/30/2022 1055   GLUCOSE 95 03/09/2014 1533   BUN 50 (H) 09/30/2022 1055   BUN 40 (H) 02/27/2020 0931   BUN 35 (H) 03/09/2014 1533   CREATININE 8.93 (H) 09/30/2022 1055   CREATININE 1.50 (H) 03/09/2014 1533   CALCIUM 8.2 (L) 09/30/2022 1055   CALCIUM 8.8 03/09/2014 1533   PROT 7.5 07/26/2021 2222   PROT 7.3 03/09/2014 1533   ALBUMIN 3.6 07/26/2021 2222   ALBUMIN 3.5 03/09/2014 1533   AST 17 07/26/2021 2222   AST 16 03/09/2014 1533   ALT 6 07/26/2021 2222   ALT 23 03/09/2014 1533   ALKPHOS 162 (H) 07/26/2021 2222   ALKPHOS 113 03/09/2014 1533   BILITOT 0.8 07/26/2021 2222   BILITOT 0.3 03/09/2014 1533   GFRNONAA 5 (L) 09/30/2022 1055   GFRNONAA 39 (L) 03/09/2014 1533   GFRNONAA 55 (L) 07/31/2013 1521     No results found.     Assessment & Plan:   1. ESRD (end stage renal disease) (HCC) Previous culture was negative for infection.  The patient's wound is having some symptoms that are suggestive of possible catheter retention.  Will have the patient return for ultrasound of the chest to evaluate and if this does not find anything we may consider CT.  2. Controlled type 2 diabetes mellitus with hyperglycemia, without long-term current use of insulin (HCC) Continue hypoglycemic medications as already ordered, these medications have been reviewed and there are no changes at this time.  Hgb A1C to be monitored as already arranged by primary service  3. Essential hypertension Continue antihypertensive medications as already ordered, these medications have been reviewed and there are no changes at this time.   Current Outpatient Medications on File Prior to Visit  Medication Sig Dispense Refill   albuterol (VENTOLIN HFA) 108 (90 Base) MCG/ACT inhaler Inhale 2 puffs into the lungs every 6 (six)  hours as needed for wheezing or shortness of breath. 8.5 g 0   apixaban (ELIQUIS) 5 MG TABS tablet Take 1 tablet (5 mg total) by mouth 2 (two) times daily. 180 tablet 1   atorvastatin (LIPITOR) 40 MG tablet Take 40 mg by mouth daily.     ciprofloxacin (CIPRO) 250 MG tablet Take 1 tablet (250 mg total) by mouth 2 (two) times daily. 14 tablet 0   cloNIDine (CATAPRES) 0.1 MG tablet Take 1 tablet (0.1 mg total) by mouth 2 (two) times daily. 180 tablet 2   CRANBERRY PO Take 1 capsule by mouth daily.     ezetimibe (ZETIA) 10 MG tablet Take 10 mg by mouth daily with supper.     ferric gluconate 250 mg in sodium chloride 0.9 % 250 mL Inject 250 mg into the vein Every Tuesday,Thursday,and Saturday with dialysis.     fluticasone-salmeterol (ADVAIR DISKUS) 100-50 MCG/ACT AEPB Inhale 1 puff into the lungs 2 (two) times daily. 60 each 1   folic acid (FOLVITE) 1 MG tablet Take 1 tablet (1 mg total) by mouth daily. 30 tablet 0   gabapentin (NEURONTIN) 300 MG capsule Take 1 capsule (300 mg total) by mouth at bedtime. 30 capsule 0   HYDROcodone-acetaminophen (NORCO) 5-325 MG tablet Take 1 tablet by mouth every 6 (six) hours as needed for moderate pain. 30 tablet 0   Lancets (ONETOUCH  DELICA PLUS LANCET30G) MISC      levothyroxine (SYNTHROID) 112 MCG tablet Take 1 tablet (112 mcg total) by mouth daily before breakfast. 30 tablet 0   melatonin 5 MG TABS Take 2 tablets (10 mg total) by mouth at bedtime as needed (sleep). 60 tablet 0   metoprolol succinate (TOPROL-XL) 25 MG 24 hr tablet Take 1 tablet (25 mg total) by mouth at bedtime. Take with or immediately following a meal. 90 tablet 3   midodrine (PROAMATINE) 10 MG tablet Take 1 tablet (10 mg total) by mouth Every Tuesday,Thursday,and Saturday with dialysis. 30 tablet 0   ONETOUCH ULTRA test strip      polyethylene glycol powder (GLYCOLAX/MIRALAX) 17 GM/SCOOP powder Take 1 Container by mouth once.     traMADol (ULTRAM) 50 MG tablet Take 1 tablet (50 mg total) by  mouth every 12 (twelve) hours as needed for moderate pain. 30 tablet 0   TRULICITY 1.5 MG/0.5ML SOPN Inject 1.5 mg into the skin every Monday.     VELPHORO 500 MG chewable tablet Chew 1,000 mg by mouth 3 (three) times daily.     acetaminophen (TYLENOL) 325 MG tablet Take 2 tablets (650 mg total) by mouth every 6 (six) hours as needed for mild pain (or Fever >/= 101). (Patient not taking: Reported on 08/23/2022)     Cholecalciferol (VITAMIN D3) 1.25 MG (50000 UT) CAPS Take 1 capsule by mouth once a week. (Patient not taking: Reported on 11/03/2022)     ergocalciferol (VITAMIN D2) 1.25 MG (50000 UT) capsule Take 1 capsule (50,000 Units total) by mouth once a week. (Patient not taking: Reported on 10/13/2022) 5 capsule 0   No current facility-administered medications on file prior to visit.    There are no Patient Instructions on file for this visit. No follow-ups on file.   Georgiana Spinner, NP

## 2022-11-07 ENCOUNTER — Other Ambulatory Visit (INDEPENDENT_AMBULATORY_CARE_PROVIDER_SITE_OTHER): Payer: Self-pay | Admitting: Nurse Practitioner

## 2022-11-07 DIAGNOSIS — L539 Erythematous condition, unspecified: Secondary | ICD-10-CM

## 2022-11-07 DIAGNOSIS — R229 Localized swelling, mass and lump, unspecified: Secondary | ICD-10-CM

## 2022-11-10 ENCOUNTER — Ambulatory Visit (INDEPENDENT_AMBULATORY_CARE_PROVIDER_SITE_OTHER): Payer: BC Managed Care – PPO

## 2022-11-10 DIAGNOSIS — L539 Erythematous condition, unspecified: Secondary | ICD-10-CM | POA: Diagnosis not present

## 2022-11-10 DIAGNOSIS — R2231 Localized swelling, mass and lump, right upper limb: Secondary | ICD-10-CM | POA: Diagnosis not present

## 2022-11-10 DIAGNOSIS — R229 Localized swelling, mass and lump, unspecified: Secondary | ICD-10-CM

## 2022-11-19 DIAGNOSIS — E662 Morbid (severe) obesity with alveolar hypoventilation: Secondary | ICD-10-CM | POA: Diagnosis not present

## 2022-11-23 DIAGNOSIS — I5032 Chronic diastolic (congestive) heart failure: Secondary | ICD-10-CM | POA: Diagnosis not present

## 2022-11-23 DIAGNOSIS — I482 Chronic atrial fibrillation, unspecified: Secondary | ICD-10-CM | POA: Diagnosis not present

## 2022-11-23 DIAGNOSIS — G4733 Obstructive sleep apnea (adult) (pediatric): Secondary | ICD-10-CM | POA: Diagnosis not present

## 2022-11-23 DIAGNOSIS — J9601 Acute respiratory failure with hypoxia: Secondary | ICD-10-CM | POA: Diagnosis not present

## 2022-11-24 ENCOUNTER — Telehealth (INDEPENDENT_AMBULATORY_CARE_PROVIDER_SITE_OTHER): Payer: BC Managed Care – PPO | Admitting: Primary Care

## 2022-11-24 ENCOUNTER — Encounter: Payer: Self-pay | Admitting: Primary Care

## 2022-11-24 DIAGNOSIS — Z992 Dependence on renal dialysis: Secondary | ICD-10-CM | POA: Diagnosis not present

## 2022-11-24 DIAGNOSIS — G4733 Obstructive sleep apnea (adult) (pediatric): Secondary | ICD-10-CM | POA: Diagnosis not present

## 2022-11-24 DIAGNOSIS — J984 Other disorders of lung: Secondary | ICD-10-CM | POA: Diagnosis not present

## 2022-11-24 DIAGNOSIS — N186 End stage renal disease: Secondary | ICD-10-CM | POA: Diagnosis not present

## 2022-11-24 MED ORDER — FLUTICASONE-SALMETEROL 100-50 MCG/ACT IN AEPB: 1.0000 | INHALATION_SPRAY | Freq: Two times a day (BID) | RESPIRATORY_TRACT | 6 refills | Status: AC

## 2022-11-24 NOTE — Progress Notes (Signed)
Virtual Visit via Telephone Note  I connected with Nicole Schmidt on 11/24/22 at  3:00 PM EDT by telephone and verified that I am speaking with the correct person using two identifiers.  Location: Patient: Home Provider: Office   I discussed the limitations, risks, security and privacy concerns of performing an evaluation and management service by telephone and the availability of in person appointments. I also discussed with the patient that there may be a patient responsible charge related to this service. The patient expressed understanding and agreed to proceed.   History of Present Illness: 57 year old female, never smoked.  Past medical history significant for OSA/OHS, chronic respiratory failure, hx PE.  Patient of Dr. Craige Cotta.   Previous [facility] pulmonary encounters: Patient was admitted on 03/28/2021 until 04/15/2021 for debility related to chronic respiratory failure superimposed with diastolic congestive heart failure.  He was previously admitted back in December for management of acute on chronic diastolic heart failure and end-stage renal disease on hemodialysis with permacath placed on 02/25/2021.  Patient presented to [facility] on 03/17/2021 with inability to ambulate and lower extremity swelling.  She became more somnolent unable to follow commands.  ABG showed pH 7.14/PCO2 89/bicarb 30.3 and was placed on BiPAP.  Chest x-ray showed bilateral pulmonary opacities that were nonspecific.  Follow-up chest x-ray on 03/22/2021 showed stable mild pulm edema.  Urine culture was positive for ESBL protease E. coli treated empirically with 1 dose fosfomycin.  Patient was admitted to rehab on 03/28/2021 for inpatient therapies.  She was reevaluated in the emergency room on 04/24/2021 for ACUTE hypoxia/NSTEMI.  Patient family member found her in her wheelchair sleep without trilogy ventilator.  She was confused upon waking.  Family called EMS.  EKG showed A-fib, no ST elevation or T wave inversions.  BG showed elevated  CO2 but only slightly, pH was normal.  Patient did not require BiPAP.  Troponins uptrending.  Felt to be demand from acute respiratory failure.  Heparin drip was not started as patient was already on Eliquis and not actively having chest pain. Discharged.   06/06/2021 Patient presents today for hospital follow-up. She is doing well. No acute respiratory symptoms. Her breathing is good. She is compliant with Advair twice daily. She has not needed to use her Albuterol rescue inhaler in several weeks. She came in with her oxygen but her tanks were empty. She is 95% on RA. Ambulates with walker. Using Trilogy every night and while napping. Finds it much easier to use than BIPAP. She sleeps in a recliner currently. There has only been one time where she took her Trilogy mask off that she is aware of and she was admitted to the ED for 26 hours for possible NSTEMI. She continues dialysis tues, thurs, sat. She thinks they took off 3.5L last dialysis round. She is following fluid restriction.   08/31/2021  Patient presents today for 6-8 week follow-up. She is compliant with Trilogy NIV at night and with naps. She prefers Trilogy NIV to CPAP. She does not have to fight wearing it. Sleeping well at night. Not waking up as frequently. She takes machine with her to dialysis. Breathing is stable. Wearing 2L oxygen as needed with exertion and at night. She has not needed oxygen during the day time recently. She brings rescue inhaler with her everywhere but has not needed to use it. She is doing more in the last week. New cane. Continues Advair 250-57mcg one puffs twice daily and prn albuterol. She will forget to use  Advair at times. PFTs in June 2023 showed moderate restrictive lung disease, recommend weight loss. She has been approved for disability. Continues dialysis.    12/07/2021  Patient presents today for regular overview.  She is doing well. No respiratory issues. Tolerating NIV. Wears 2L oxygen at night with  Trilogy. She gets winded with exertion d/t afib. She has not needed to use rescue inhaler.  She continues to get dialysis on Tues, Thursday and Saturday. Planning for fistula placement above the elbow this Friday. She saw cardiology on 12/05/21, they adjusted her medications.   Airview 04/3021-05/14/21 Usage 27/30 days; 90% > 4 hours Average usage 8 hours 21 mins Min EPAP 4; Max EPAP 15cm h20 Min PS 6; Max PS 22 Airleaks median 6.8 (95%- 48L/min) AHI 0.6   Airview download Sept 25-Oct 24 Usage 26/30 days Average usage 4 hours 35 mins Min EPAP 4; Max EPAP 15cm h20 Min PS 6; Max PS 22 Airleaks median 0.8L/min  AHI 0.6  10/13/2022 Patient presents today for 6 months OHS/OSA and restrictive lung disease. Patient is 80% compliant with noninvasive ventilator (mode-iVAPS auto EPAP). Average usage 4 hours 1 minute. Using fullface mask. At times she falls asleep before putting her mask on at times feels she is not getting a good seal.  Experiences dyspnea symptoms with exertion. She feels she may have done better when using Advair, not currently on BD regimen. Denies cough, wheezing, chest tightness.  Trilogy compliance report 09/13/22-10/12/22  Usage 24/30 days (80%) Min EPAP 4; Max EPAP 15cm h20 Min PS 6; Max PS 22 Airleaks 18.0L/min (95%) AHI 0.4    11/24/2022- Interim hx  Patient contacted today for 6-8 week follow-up after restarting Advair  She reports that dyspnea symptoms with exertion are not quite as bad as they previously were. She would like to stay on Advair, [facility]. Prefers NIV Trilogy to CPAP. She will fall asleep at times without putting her mask on.   [facility] Airview compliance report 10/25/2022 - 11/23/2022 Usage days 18/30 days (60%) Average usage days used 5 hours 36 minutes Noninvasive ventilator astral 100: Program 1 Min EPAP 4/Max EPAP 15 Air leaks 18.2 L/min (95%) AHI 0.3  Pulmonary function testing: 08/12/21>> FVC 1.89(55%), FEV 1.58  (60%), ratio 84, TLC 85%, DLCO 20.47 (78%)   Observations/Objective:  Appear well without overt respiratory symptoms   Assessment and Plan:  OSA  - OSA/OHS. Prefers NIV vs CPAP. Well controlled on non-invasive ventilation. She is 60% compliant with usage. Average usage 5 hours 36 mins. Min EPAP 4; Max EPAP 15. Min PS 6; Max PS 22. Airleaks 18.2L/min. Residual AHI 0.3/hour.   Restrictive lung disease - PFTs in June 2023 showed moderate restrictive lung disease. Dyspnea symptoms improved after resuming Advair diskus 100-28mcg one puff twice daily  ESRD (end stage renal disease) (HCC) - Receiving HD on Tues/Thursday/Saturday; she takes midodrine prior to dialysis days   Follow Up Instructions:  - 6 months or sooner if needed   I discussed the assessment and treatment plan with the patient. The patient was provided an opportunity to ask questions and all were answered. The patient agreed with the plan and demonstrated an understanding of the instructions.   The patient was advised to call back or seek an in-person evaluation if the symptoms worsen or if the condition fails to improve as anticipated.  I provided 22 minutes of non-face-to-face time during this encounter.   Glenford Bayley, NP

## 2022-11-27 ENCOUNTER — Other Ambulatory Visit (INDEPENDENT_AMBULATORY_CARE_PROVIDER_SITE_OTHER): Payer: Self-pay | Admitting: Nurse Practitioner

## 2022-11-27 DIAGNOSIS — T829XXS Unspecified complication of cardiac and vascular prosthetic device, implant and graft, sequela: Secondary | ICD-10-CM

## 2022-11-27 DIAGNOSIS — N186 End stage renal disease: Secondary | ICD-10-CM

## 2022-11-29 ENCOUNTER — Ambulatory Visit
Admission: RE | Admit: 2022-11-29 | Discharge: 2022-11-29 | Disposition: A | Discharge status: Home / Self Care | Payer: BC Managed Care – PPO | Source: Ambulatory Visit | Attending: Oncology | Admitting: Oncology

## 2022-11-29 DIAGNOSIS — Z08 Encounter for follow-up examination after completed treatment for malignant neoplasm: Secondary | ICD-10-CM | POA: Diagnosis not present

## 2022-11-29 DIAGNOSIS — Z853 Personal history of malignant neoplasm of breast: Secondary | ICD-10-CM | POA: Insufficient documentation

## 2022-11-29 DIAGNOSIS — L98429 Non-pressure chronic ulcer of back with unspecified severity: Secondary | ICD-10-CM | POA: Diagnosis not present

## 2022-11-29 DIAGNOSIS — Z1231 Encounter for screening mammogram for malignant neoplasm of breast: Secondary | ICD-10-CM | POA: Insufficient documentation

## 2022-11-29 DIAGNOSIS — L738 Other specified follicular disorders: Secondary | ICD-10-CM | POA: Diagnosis not present

## 2022-12-04 ENCOUNTER — Ambulatory Visit (INDEPENDENT_AMBULATORY_CARE_PROVIDER_SITE_OTHER): Payer: BC Managed Care – PPO

## 2022-12-04 ENCOUNTER — Ambulatory Visit (INDEPENDENT_AMBULATORY_CARE_PROVIDER_SITE_OTHER): Payer: BC Managed Care – PPO | Admitting: Vascular Surgery

## 2022-12-04 ENCOUNTER — Encounter (INDEPENDENT_AMBULATORY_CARE_PROVIDER_SITE_OTHER): Payer: Self-pay | Admitting: Vascular Surgery

## 2022-12-04 VITALS — BP 93/59 | HR 102 | Resp 18 | Ht 61.0 in | Wt 350.6 lb

## 2022-12-04 DIAGNOSIS — N186 End stage renal disease: Secondary | ICD-10-CM | POA: Diagnosis not present

## 2022-12-04 DIAGNOSIS — Z992 Dependence on renal dialysis: Secondary | ICD-10-CM

## 2022-12-04 DIAGNOSIS — I482 Chronic atrial fibrillation, unspecified: Secondary | ICD-10-CM

## 2022-12-04 DIAGNOSIS — E785 Hyperlipidemia, unspecified: Secondary | ICD-10-CM

## 2022-12-04 DIAGNOSIS — I10 Essential (primary) hypertension: Secondary | ICD-10-CM | POA: Diagnosis not present

## 2022-12-04 DIAGNOSIS — E669 Obesity, unspecified: Secondary | ICD-10-CM

## 2022-12-04 DIAGNOSIS — T829XXS Unspecified complication of cardiac and vascular prosthetic device, implant and graft, sequela: Secondary | ICD-10-CM

## 2022-12-04 DIAGNOSIS — E1169 Type 2 diabetes mellitus with other specified complication: Secondary | ICD-10-CM | POA: Diagnosis not present

## 2022-12-04 NOTE — Progress Notes (Unsigned)
MRN : [id number]  Nicole Schmidt is a 57 y.o. ([birth year]) female who presents with chief complaint of check access.  History of Present Illness:   The patient returns to the office for follow up regarding a problem with their dialysis access.   The patient notes a significant increase in problems with dialysis.  It is reported that adequate dialysis is not being achieved.    The patient denies hand pain or other symptoms consistent with steal phenomena.  No significant arm swelling.  The patient denies redness or swelling at the access site. The patient denies fever or chills at home or while on dialysis.  No recent shortening of the patient's walking distance or new symptoms consistent with claudication.  No history of rest pain symptoms. No new ulcers or wounds of the lower extremities have occurred.  The patient denies amaurosis fugax or recent TIA symptoms. There are no recent neurological changes noted. There is no history of DVT, PE or superficial thrombophlebitis. No recent episodes of angina or shortness of breath documented.   Duplex ultrasound of the AV access shows a patent access.  The previously noted stenosis is significantly increased compared to last study.  Flow volume today is 927 cc/min (previous flow volume was 1401 cc/min)  No outpatient medications have been marked as taking for the 12/04/22 encounter (Appointment) with Gilda Crease, Latina Craver, MD.    Past Medical History:  Diagnosis Date   (HFpEF) heart failure with preserved ejection fraction (HCC)    a. 10/2018 Echo: EF 60-65%, diast dysfxn, RVSP 50.7 mmHg; b. 01/2020 Echo: EF 65-70%, G2DD; c. 01/2021 Echo: EF 60-65%, no rwma, mod LVH. Nl RV fxn, mildly enlarged RV. Mod BAE. Triv MR. d. 05/2021 Echo: EF 55-60; no RWMAs, mild LVH, mild TR.   Acquired hypothyroidism 02/09/2020   Adenoma of left adrenal gland    Anemia    Aortic atherosclerosis (HCC)    Arthritis    Breast cancer of upper-inner  quadrant of right female breast (HCC)    Right breast invasive CA and DCIS , 7 mm T1,N0,M0. Er/PR pos, her 2 negative.  Margins 1 mm.   COPD (chronic obstructive pulmonary disease) (HCC)    Diabetes mellitus type 2 in obese 02/09/2020   Difficult intubation 09/28/2021   a.) mallampati III-IV; BMI >55 kg/m   Diverticulosis    Dyslipidemia 02/09/2020   ESBL (extended spectrum beta-lactamase) producing bacteria infection 03/20/2021   a. urine culture (+) for ESBL producing E.coli and P. mirabilis   ESRD (end stage renal disease) on dialysis (HCC)    a. T-Th-Sat HD initiated 02/2021   Essential hypertension    Gout    History of kidney stones    Long term current use of anticoagulant    a.) apixaban   Menopause    age 62   Morbid obesity (HCC)    OSA treated with BiPAP    a. on nocturnal PAP therapy; Trilogy 100 ventilator   Permanent atrial fibrillation (HCC)    a. Dx 10/2018 in setting of PNA. b. CHA2DS2-VASc = 7 (sex, HFpEF, HTN, PE x2, aortic plaque, T2DM); c. s/p successful DCCV 11/2018. d. subsequent recurrent Afib; rate/rhythm maintained on oral metoprolol succinate; chronically anticoagulated using standard dose apixaban.   Personal history of radiation therapy    Pulmonary embolism (HCC) 10/2014   a. on chronic apixaban   Restrictive lung disease  Shingles 10/24/2021   Sleep difficulties    a.) takes melatonin   Thyroid goiter     Past Surgical History:  Procedure Laterality Date   A/V FISTULAGRAM Left 09/02/2021   Procedure: A/V Fistulagram;  Surgeon: Renford Dills, MD;  Location: [facility];  Service: Cardiovascular;  Laterality: Left;   A/V FISTULAGRAM Left 06/27/2022   Procedure: A/V Fistulagram;  Surgeon: Renford Dills, MD;  Location: [facility];  Service: Cardiovascular;  Laterality: Left;   AV FISTULA PLACEMENT Left 07/22/2021   Procedure: ARTERIOVENOUS (AV) FISTULA CREATION (RADIALCEPHALIC);  Surgeon: Renford Dills, MD;  Location:  [facility];  Service: Vascular;  Laterality: Left;   AV FISTULA PLACEMENT Left 12/09/2021   Procedure: ARTERIOVENOUS (AV) FISTULA CREATION (BRACHIAL CEPHALIC);  Surgeon: Renford Dills, MD;  Location: [facility];  Service: Vascular;  Laterality: Left;   BREAST BIOPSY Right 2014   breast ca   BREAST EXCISIONAL BIOPSY Right 07/19/2012   breast ca   BREAST SURGERY Right 2014   with sentinel node bx subareaolar duct excision   CARDIOVERSION N/A 11/28/2018   Procedure: CARDIOVERSION;  Surgeon: Antonieta Iba, MD;  Location: [facility];  Service: Cardiovascular;  Laterality: N/A;   COLONOSCOPY WITH PROPOFOL N/A 01/30/2017   Procedure: COLONOSCOPY WITH PROPOFOL;  Surgeon: Kieth Brightly, MD;  Location: [facility];  Service: Endoscopy;  Laterality: N/A;   CYSTOSCOPY W/ RETROGRADES  06/17/2021   Procedure: CYSTOSCOPY WITH RETROGRADE PYELOGRAM;  Surgeon: Sondra Come, MD;  Location: [facility];  Service: Urology;;   CYSTOSCOPY W/ URETERAL STENT PLACEMENT Left 02/14/2021   Procedure: CYSTOSCOPY WITH RETROGRADE PYELOGRAM/URETERAL STENT PLACEMENT;  Surgeon: Sondra Come, MD;  Location: [facility];  Service: Urology;  Laterality: Left;   CYSTOSCOPY/URETEROSCOPY/HOLMIUM LASER/STENT PLACEMENT Left 06/17/2021   Procedure: CYSTOSCOPY/URETEROSCOPY/HOLMIUM LASER/STENT EXCHANGE;  Surgeon: Sondra Come, MD;  Location: [facility];  Service: Urology;  Laterality: Left;   DIALYSIS/PERMA CATHETER INSERTION N/A 02/24/2021   Procedure: DIALYSIS/PERMA CATHETER INSERTION;  Surgeon: Annice Needy, MD;  Location: [facility];  Service: Cardiovascular;  Laterality: N/A;   DIALYSIS/PERMA CATHETER INSERTION N/A 12/16/2021   Procedure: DIALYSIS/PERMA CATHETER INSERTION;  Surgeon: Renford Dills, MD;  Location: [facility];  Service: Cardiovascular;  Laterality: N/A;   DIALYSIS/PERMA CATHETER REMOVAL N/A 09/04/2022   Procedure: DIALYSIS/PERMA CATHETER REMOVAL;  Surgeon: Annice Needy, MD;  Location:  [facility];  Service: Cardiovascular;  Laterality: N/A;   PERIPHERAL VASCULAR THROMBECTOMY Left 09/28/2021   Procedure: PERIPHERAL VASCULAR THROMBECTOMY;  Surgeon: Renford Dills, MD;  Location: [facility];  Service: Cardiovascular;  Laterality: Left;   REVISON OF ARTERIOVENOUS FISTULA Left 04/28/2022   Procedure: REVISON OF ARTERIOVENOUS FISTULA (BRACHIAL CEPHALIC);  Surgeon: Renford Dills, MD;  Location: [facility];  Service: Vascular;  Laterality: Left;    Social History Social History   Tobacco Use   Smoking status: Never    Passive exposure: Never   Smokeless tobacco: Never  Vaping Use   Vaping status: Never Used  Substance Use Topics   Alcohol use: No    Alcohol/week: 0.0 standard drinks of alcohol   Drug use: No    Family History Family History  Problem Relation Age of Onset   Diabetes Father    Hypertension Father    Parkinson's disease Father    Hypertension Mother    Rheum arthritis Mother    Atrial fibrillation Mother    Heart failure Mother  Heart attack Mother        died in her mid-70's.   Colon cancer Cousin 54   Breast cancer Neg Hx     Allergies  Allergen Reactions   Spironolactone     Hyperkalemia  Pt unaware of this allergy     REVIEW OF SYSTEMS (Negative unless checked)  Constitutional: [] Weight loss  [] Fever  [] Chills Cardiac: [] Chest pain   [] Chest pressure   [] Palpitations   [] Shortness of breath when laying flat   [] Shortness of breath with exertion. Vascular:  [] Pain in legs with walking   [] Pain in legs at rest  [] History of DVT   [] Phlebitis   [] Swelling in legs   [] Varicose veins   [] Non-healing ulcers Pulmonary:   [] Uses home oxygen   [] Productive cough   [] Hemoptysis   [] Wheeze  [] COPD   [] Asthma Neurologic:  [] Dizziness   [] Seizures   [] History of stroke   [] History of TIA  [] Aphasia   [] Vissual changes   [] Weakness or numbness in arm   [] Weakness or numbness in leg Musculoskeletal:   [] Joint swelling    [] Joint pain   [] Low back pain Hematologic:  [] Easy bruising  [] Easy bleeding   [] Hypercoagulable state   [] Anemic Gastrointestinal:  [] Diarrhea   [] Vomiting  [] Gastroesophageal reflux/heartburn   [] Difficulty swallowing. Genitourinary:  [x] Chronic kidney disease   [] Difficult urination  [] Frequent urination   [] Blood in urine Skin:  [] Rashes   [] Ulcers  Psychological:  [] History of anxiety   []  History of major depression.  Physical Examination  There were no vitals filed for this visit. There is no height or weight on file to calculate BMI. Gen: WD/WN, NAD Head: [place]/AT, No temporalis wasting.  Ear/Nose/Throat: Hearing grossly intact, nares w/o erythema or drainage Eyes: PER, EOMI, sclera nonicteric.  Neck: Supple, no gross masses or lesions.  No JVD.  Pulmonary:  Good air movement, no audible wheezing, no use of accessory muscles.  Cardiac: RRR, precordium non-hyperdynamic. Vascular:   left arm fistula good thrill at the antecubital fossa but diminishes rapidy Vessel Right Left  Radial Palpable Palpable  Brachial Palpable Palpable  Gastrointestinal: soft, non-distended. No guarding/no peritoneal signs.  Musculoskeletal: M/S 5/5 throughout.  No deformity.  Neurologic: CN 2-12 intact. Pain and light touch intact in extremities.  Symmetrical.  Speech is fluent. Motor exam as listed above. Psychiatric: Judgment intact, Mood & affect appropriate for pt's clinical situation. Dermatologic: No rashes or ulcers noted.  No changes consistent with cellulitis.   CBC Lab Results  Component Value Date   WBC 11.3 (H) 09/30/2022   HGB 12.4 09/30/2022   HCT 41.0 09/30/2022   MCV 115.8 (H) 09/30/2022   PLT 156 09/30/2022    BMET    Component Value Date/Time   NA 136 09/30/2022 1055   NA 144 02/27/2020 0931   NA 141 03/09/2014 1533   K 5.0 09/30/2022 1055   K 4.2 03/09/2014 1533   CL 99 09/30/2022 1055   CL 101 03/09/2014 1533   CO2 27 09/30/2022 1055   CO2 30 03/09/2014 1533    GLUCOSE 180 (H) 09/30/2022 1055   GLUCOSE 95 03/09/2014 1533   BUN 50 (H) 09/30/2022 1055   BUN 40 (H) 02/27/2020 0931   BUN 35 (H) 03/09/2014 1533   CREATININE 8.93 (H) 09/30/2022 1055   CREATININE 1.50 (H) 03/09/2014 1533   CALCIUM 8.2 (L) 09/30/2022 1055   CALCIUM 8.8 03/09/2014 1533   GFRNONAA 5 (L) 09/30/2022 1055   GFRNONAA 39 (L) 03/09/2014 1533  GFRNONAA 55 (L) 07/31/2013 1521   GFRAA 35 (L) 02/27/2020 0931   GFRAA 47 (L) 03/09/2014 1533   GFRAA >60 07/31/2013 1521   CrCl cannot be calculated (Patient's most recent lab result is older than the maximum 21 days allowed.).  COAG Lab Results  Component Value Date   INR 1.1 02/15/2021   INR 1.35 01/12/2018   INR 1.27 11/16/2017    Radiology MM 3D SCREEN BREAST BILATERAL  Result Date: 12/01/2022 CLINICAL DATA:  Screening. EXAM: DIGITAL SCREENING BILATERAL MAMMOGRAM WITH TOMOSYNTHESIS AND CAD TECHNIQUE: Bilateral screening digital craniocaudal and mediolateral oblique mammograms were obtained. Bilateral screening digital breast tomosynthesis was performed. The images were evaluated with computer-aided detection. COMPARISON:  Previous exam(s). ACR Breast Density Category a: The breasts are almost entirely fatty. FINDINGS: There are no findings suspicious for malignancy. IMPRESSION: No mammographic evidence of malignancy. A result letter of this screening mammogram will be mailed directly to the patient. RECOMMENDATION: Screening mammogram in one year. (Code:SM-B-01Y) BI-RADS CATEGORY  1: Negative. Electronically Signed   By: Frederico Hamman M.D.   On: 12/01/2022 09:59   VAS [name] UPPER EXTREMITY VENOUS DUPLEX  Result Date: 11/13/2022 UPPER VENOUS STUDY  Patient Name:  Nicole Schmidt  Date of Exam:   11/10/2022 Medical Rec #: [id number]       Accession #:    [id number] Date of Birth: 24-Sep-1965        Patient Gender: F Patient Age:   56 years Exam Location:  [facility] Procedure:      VAS [name] UPPER EXTREMITY VENOUS DUPLEX  Referring Phys: Sheppard Plumber --------------------------------------------------------------------------------  Indications: S/P Permacath removal, and Swelling Performing Technologist: Debbe Bales RVS  Examination Guidelines: A complete evaluation includes B-mode imaging, spectral Doppler, color Doppler, and power Doppler as needed of all accessible portions of each vessel. Bilateral testing is considered an integral part of a complete examination. Limited examinations for reoccurring indications may be performed as noted.  Right Findings: +----------+------------+---------+-----------+----------+-------+ RIGHT     CompressiblePhasicitySpontaneousPropertiesSummary +----------+------------+---------+-----------+----------+-------+ Subclavian    Full       Yes       Yes                      +----------+------------+---------+-----------+----------+-------+  Summary:  Right: There appears to be No evidence of Occlusion of the Subclavian Vein or Artery; No Foreign body seen in Removal site.  *See table(s) above for measurements and observations.  Diagnosing physician: Festus Barren MD Electronically signed by Festus Barren MD on 11/13/2022 at 9:11:28 AM.    Final      Assessment/Plan 1. ESRD on hemodialysis [facility]) Recommend:  The patient is experiencing increasing problems with their dialysis access.  Patient should have a fistulagram with the intention for intervention.  The intention for intervention is to restore appropriate flow and prevent thrombosis and possible loss of the access.  As well as improve the quality of dialysis therapy.  The risks, benefits and alternative therapies were reviewed in detail with the patient.  All questions were answered.  The patient agrees to proceed with angio/intervention.    The patient will follow up with me in the office after the procedure.   2. Type 2 diabetes mellitus with obesity (HCC) Continue hypoglycemic medications as already ordered, these  medications have been reviewed and there are no changes at this time.  Hgb A1C to be monitored as already arranged by primary service  3. Essential hypertension Continue antihypertensive medications as already ordered, these  medications have been reviewed and there are no changes at this time.  4. Atrial fibrillation, chronic (HCC) Continue antiarrhythmia medications as already ordered, these medications have been reviewed and there are no changes at this time.  Continue anticoagulation as ordered by Cardiology Service  5. Dyslipidemia Continue statin as ordered and reviewed, no changes at this time    Levora Dredge, MD  12/04/2022 9:30 AM

## 2022-12-06 ENCOUNTER — Encounter (INDEPENDENT_AMBULATORY_CARE_PROVIDER_SITE_OTHER): Payer: Self-pay | Admitting: Vascular Surgery

## 2022-12-08 ENCOUNTER — Telehealth (INDEPENDENT_AMBULATORY_CARE_PROVIDER_SITE_OTHER): Payer: Self-pay

## 2022-12-08 NOTE — Telephone Encounter (Signed)
I attempted to contact the patient to schedule a left arm fistulagram with Dr. Schnier. A  message was left for a return call. °

## 2022-12-12 ENCOUNTER — Telehealth (INDEPENDENT_AMBULATORY_CARE_PROVIDER_SITE_OTHER): Payer: Self-pay

## 2022-12-12 NOTE — Telephone Encounter (Signed)
Spoke with the patient, she is scheduled with Dr. Gilda Crease for a left arm fistulagram on 12/22/22 with a 11:30 am arrival time to the [facility]. Patient was offered 12/19/22 and declined stating that was her dialysis day and that she was told he was only going to look. I explained I was scheduling her for the same procedure she has had on multiple occasions. Patient was offered to come back in to speak with the provider and declined. Pre-procedure instructions were discussed and will be sent to Mychart and mailed.

## 2022-12-12 NOTE — Telephone Encounter (Signed)
Patient called back to reschedule her procedure to 01/05/23 with Dr. Gilda Crease. This will be sent to Mychart and mailed.

## 2022-12-15 ENCOUNTER — Telehealth (INDEPENDENT_AMBULATORY_CARE_PROVIDER_SITE_OTHER): Payer: Self-pay

## 2022-12-15 NOTE — Telephone Encounter (Signed)
Patient is scheduled for a fistulagram with Dr. Gilda Crease on 01/05/23 and left a message wanting to know about anesthesia. I attempted to contact the patient to let her know that once I clarify with Dr. Gilda Crease regarding scheduling anesthesia I will get back with her.

## 2022-12-20 DIAGNOSIS — E662 Morbid (severe) obesity with alveolar hypoventilation: Secondary | ICD-10-CM | POA: Diagnosis not present

## 2022-12-22 DIAGNOSIS — Z0000 Encounter for general adult medical examination without abnormal findings: Secondary | ICD-10-CM | POA: Diagnosis not present

## 2022-12-22 DIAGNOSIS — E118 Type 2 diabetes mellitus with unspecified complications: Secondary | ICD-10-CM | POA: Diagnosis not present

## 2022-12-22 DIAGNOSIS — E039 Hypothyroidism, unspecified: Secondary | ICD-10-CM | POA: Diagnosis not present

## 2022-12-22 DIAGNOSIS — E1142 Type 2 diabetes mellitus with diabetic polyneuropathy: Secondary | ICD-10-CM | POA: Diagnosis not present

## 2022-12-22 DIAGNOSIS — E221 Hyperprolactinemia: Secondary | ICD-10-CM | POA: Diagnosis not present

## 2022-12-24 DIAGNOSIS — I482 Chronic atrial fibrillation, unspecified: Secondary | ICD-10-CM | POA: Diagnosis not present

## 2022-12-24 DIAGNOSIS — J9601 Acute respiratory failure with hypoxia: Secondary | ICD-10-CM | POA: Diagnosis not present

## 2022-12-24 DIAGNOSIS — I5032 Chronic diastolic (congestive) heart failure: Secondary | ICD-10-CM | POA: Diagnosis not present

## 2022-12-24 DIAGNOSIS — G4733 Obstructive sleep apnea (adult) (pediatric): Secondary | ICD-10-CM | POA: Diagnosis not present

## 2023-01-03 DIAGNOSIS — L821 Other seborrheic keratosis: Secondary | ICD-10-CM | POA: Diagnosis not present

## 2023-01-03 DIAGNOSIS — L98429 Non-pressure chronic ulcer of back with unspecified severity: Secondary | ICD-10-CM | POA: Diagnosis not present

## 2023-01-05 ENCOUNTER — Encounter: Payer: Self-pay | Admitting: Vascular Surgery

## 2023-01-05 ENCOUNTER — Ambulatory Visit: Payer: BC Managed Care – PPO | Admitting: Anesthesiology

## 2023-01-05 ENCOUNTER — Encounter
Admission: RE | Disposition: A | Discharge status: Home / Self Care | Payer: Self-pay | Source: Home / Self Care | Attending: Vascular Surgery

## 2023-01-05 ENCOUNTER — Other Ambulatory Visit: Payer: Self-pay

## 2023-01-05 ENCOUNTER — Ambulatory Visit
Admission: RE | Admit: 2023-01-05 | Discharge: 2023-01-05 | Disposition: A | Discharge status: Home / Self Care | Payer: BC Managed Care – PPO | Attending: Vascular Surgery | Admitting: Vascular Surgery

## 2023-01-05 DIAGNOSIS — T82858A Stenosis of vascular prosthetic devices, implants and grafts, initial encounter: Secondary | ICD-10-CM | POA: Insufficient documentation

## 2023-01-05 DIAGNOSIS — Z7901 Long term (current) use of anticoagulants: Secondary | ICD-10-CM | POA: Diagnosis not present

## 2023-01-05 DIAGNOSIS — Z992 Dependence on renal dialysis: Secondary | ICD-10-CM | POA: Insufficient documentation

## 2023-01-05 DIAGNOSIS — I5032 Chronic diastolic (congestive) heart failure: Secondary | ICD-10-CM | POA: Diagnosis not present

## 2023-01-05 DIAGNOSIS — I132 Hypertensive heart and chronic kidney disease with heart failure and with stage 5 chronic kidney disease, or end stage renal disease: Secondary | ICD-10-CM | POA: Diagnosis not present

## 2023-01-05 DIAGNOSIS — Z7984 Long term (current) use of oral hypoglycemic drugs: Secondary | ICD-10-CM | POA: Diagnosis not present

## 2023-01-05 DIAGNOSIS — N186 End stage renal disease: Secondary | ICD-10-CM | POA: Insufficient documentation

## 2023-01-05 DIAGNOSIS — G4733 Obstructive sleep apnea (adult) (pediatric): Secondary | ICD-10-CM | POA: Diagnosis not present

## 2023-01-05 DIAGNOSIS — E1122 Type 2 diabetes mellitus with diabetic chronic kidney disease: Secondary | ICD-10-CM | POA: Diagnosis not present

## 2023-01-05 DIAGNOSIS — Z6841 Body Mass Index (BMI) 40.0 and over, adult: Secondary | ICD-10-CM | POA: Insufficient documentation

## 2023-01-05 DIAGNOSIS — E785 Hyperlipidemia, unspecified: Secondary | ICD-10-CM | POA: Diagnosis not present

## 2023-01-05 DIAGNOSIS — I5033 Acute on chronic diastolic (congestive) heart failure: Secondary | ICD-10-CM | POA: Diagnosis not present

## 2023-01-05 DIAGNOSIS — I482 Chronic atrial fibrillation, unspecified: Secondary | ICD-10-CM | POA: Diagnosis not present

## 2023-01-05 DIAGNOSIS — Z79899 Other long term (current) drug therapy: Secondary | ICD-10-CM | POA: Insufficient documentation

## 2023-01-05 DIAGNOSIS — Y841 Kidney dialysis as the cause of abnormal reaction of the patient, or of later complication, without mention of misadventure at the time of the procedure: Secondary | ICD-10-CM | POA: Diagnosis not present

## 2023-01-05 DIAGNOSIS — J449 Chronic obstructive pulmonary disease, unspecified: Secondary | ICD-10-CM | POA: Diagnosis not present

## 2023-01-05 HISTORY — PX: A/V FISTULAGRAM: CATH118298

## 2023-01-05 LAB — POTASSIUM (ARMC VASCULAR LAB ONLY): Potassium (ARMC vascular lab): 4.6 mmol/L (ref 3.5–5.1)

## 2023-01-05 SURGERY — A/V FISTULAGRAM
Anesthesia: General | Laterality: Left

## 2023-01-05 MED ORDER — SUGAMMADEX SODIUM 200 MG/2ML IV SOLN
INTRAVENOUS | Status: DC | PRN
  Administered 2023-01-05: 400 mg via INTRAVENOUS

## 2023-01-05 MED ORDER — OXYCODONE HCL 5 MG PO TABS: 5.0000 mg | ORAL_TABLET | Freq: Once | ORAL | Status: DC | PRN

## 2023-01-05 MED ORDER — ONDANSETRON HCL 4 MG/2ML IJ SOLN
INTRAMUSCULAR | Status: DC | PRN
  Administered 2023-01-05: 4 mg via INTRAVENOUS

## 2023-01-05 MED ORDER — LIDOCAINE HCL (PF) 1 % IJ SOLN
INTRAMUSCULAR | Status: DC | PRN
  Administered 2023-01-05: 10 mL

## 2023-01-05 MED ORDER — SUCCINYLCHOLINE CHLORIDE 200 MG/10ML IV SOSY
PREFILLED_SYRINGE | INTRAVENOUS | Status: DC | PRN
  Administered 2023-01-05: 120 mg via INTRAVENOUS

## 2023-01-05 MED ORDER — FAMOTIDINE 20 MG PO TABS: 40.0000 mg | ORAL_TABLET | Freq: Once | ORAL | Status: DC | PRN

## 2023-01-05 MED ORDER — PROPOFOL 1000 MG/100ML IV EMUL
INTRAVENOUS | Status: AC
  Filled 2023-01-05: qty 100

## 2023-01-05 MED ORDER — PROPOFOL 500 MG/50ML IV EMUL
INTRAVENOUS | Status: DC | PRN
  Administered 2023-01-05: 120 ug/kg/min via INTRAVENOUS

## 2023-01-05 MED ORDER — GLYCOPYRROLATE 0.2 MG/ML IJ SOLN
INTRAMUSCULAR | Status: DC | PRN
  Administered 2023-01-05: .2 mg via INTRAVENOUS

## 2023-01-05 MED ORDER — HEPARIN SODIUM (PORCINE) 1000 UNIT/ML IJ SOLN
INTRAMUSCULAR | Status: DC | PRN
  Administered 2023-01-05: 4000 [IU] via INTRAVENOUS

## 2023-01-05 MED ORDER — ROCURONIUM BROMIDE 100 MG/10ML IV SOLN
INTRAVENOUS | Status: DC | PRN
  Administered 2023-01-05: 25 mg via INTRAVENOUS

## 2023-01-05 MED ORDER — DIPHENHYDRAMINE HCL 50 MG/ML IJ SOLN: 50.0000 mg | Freq: Once | INTRAMUSCULAR | Status: DC | PRN

## 2023-01-05 MED ORDER — CEFAZOLIN SODIUM-DEXTROSE 1-4 GM/50ML-% IV SOLN
INTRAVENOUS | Status: AC
  Filled 2023-01-05: qty 50

## 2023-01-05 MED ORDER — IODIXANOL 320 MG/ML IV SOLN
INTRAVENOUS | Status: DC | PRN
  Administered 2023-01-05: 50 mL

## 2023-01-05 MED ORDER — HEPARIN (PORCINE) IN NACL 1000-0.9 UT/500ML-% IV SOLN
INTRAVENOUS | Status: DC | PRN
  Administered 2023-01-05: 1000 mL

## 2023-01-05 MED ORDER — SODIUM CHLORIDE 0.9 % IV SOLN
INTRAVENOUS | Status: DC
Start: 2023-01-05 — End: 2023-01-05

## 2023-01-05 MED ORDER — PROPOFOL 10 MG/ML IV BOLUS
INTRAVENOUS | Status: DC | PRN
  Administered 2023-01-05: 200 mg via INTRAVENOUS

## 2023-01-05 MED ORDER — MIDAZOLAM HCL 2 MG/ML PO SYRP: 8.0000 mg | ORAL_SOLUTION | Freq: Once | ORAL | Status: DC | PRN

## 2023-01-05 MED ORDER — METHYLPREDNISOLONE SODIUM SUCC 125 MG IJ SOLR: 125.0000 mg | Freq: Once | INTRAMUSCULAR | Status: DC | PRN

## 2023-01-05 MED ORDER — ONDANSETRON HCL 4 MG/2ML IJ SOLN
INTRAMUSCULAR | Status: AC
  Filled 2023-01-05: qty 2

## 2023-01-05 MED ORDER — LIDOCAINE HCL (PF) 2 % IJ SOLN
INTRAMUSCULAR | Status: AC
  Filled 2023-01-05: qty 5

## 2023-01-05 MED ORDER — GLYCOPYRROLATE 0.2 MG/ML IJ SOLN
INTRAMUSCULAR | Status: AC
  Filled 2023-01-05: qty 1

## 2023-01-05 MED ORDER — PHENYLEPHRINE HCL (PRESSORS) 10 MG/ML IV SOLN
INTRAVENOUS | Status: DC | PRN
  Administered 2023-01-05 (×5): 160 ug via INTRAVENOUS

## 2023-01-05 MED ORDER — HEPARIN SODIUM (PORCINE) 1000 UNIT/ML IJ SOLN
INTRAMUSCULAR | Status: AC
  Filled 2023-01-05: qty 10

## 2023-01-05 MED ORDER — DEXMEDETOMIDINE HCL IN NACL 80 MCG/20ML IV SOLN
INTRAVENOUS | Status: DC | PRN
  Administered 2023-01-05: 20 ug via INTRAVENOUS

## 2023-01-05 MED ORDER — ACETAMINOPHEN 10 MG/ML IV SOLN: 1000.0000 mg | Freq: Once | INTRAVENOUS | Status: DC | PRN

## 2023-01-05 MED ORDER — DEXAMETHASONE SODIUM PHOSPHATE 10 MG/ML IJ SOLN
INTRAMUSCULAR | Status: DC | PRN
  Administered 2023-01-05: 10 mg via INTRAVENOUS

## 2023-01-05 MED ORDER — DEXAMETHASONE SODIUM PHOSPHATE 10 MG/ML IJ SOLN
INTRAMUSCULAR | Status: AC
  Filled 2023-01-05: qty 1

## 2023-01-05 MED ORDER — ASPIRIN 81 MG PO TBEC
81.0000 mg | DELAYED_RELEASE_TABLET | Freq: Every day | ORAL | 1 refills | Status: AC
Start: 2023-01-05 — End: 2024-01-05

## 2023-01-05 MED ORDER — FENTANYL CITRATE (PF) 100 MCG/2ML IJ SOLN: 25.0000 ug | INTRAMUSCULAR | Status: DC | PRN

## 2023-01-05 MED ORDER — ONDANSETRON HCL 4 MG/2ML IJ SOLN: 4.0000 mg | Freq: Four times a day (QID) | INTRAMUSCULAR | Status: DC | PRN

## 2023-01-05 MED ORDER — OXYCODONE HCL 5 MG/5ML PO SOLN
5.0000 mg | Freq: Once | ORAL | Status: DC | PRN
  Filled 2023-01-05: qty 5

## 2023-01-05 MED ORDER — LIDOCAINE HCL (CARDIAC) PF 100 MG/5ML IV SOSY
PREFILLED_SYRINGE | INTRAVENOUS | Status: DC | PRN
  Administered 2023-01-05: 50 mg via INTRAVENOUS

## 2023-01-05 MED ORDER — LACTATED RINGERS IV SOLN: INTRAVENOUS | Status: AC

## 2023-01-05 MED ORDER — PROPOFOL 10 MG/ML IV BOLUS
INTRAVENOUS | Status: AC
  Filled 2023-01-05: qty 20

## 2023-01-05 MED ORDER — HYDROMORPHONE HCL 1 MG/ML IJ SOLN: 1.0000 mg | Freq: Once | INTRAMUSCULAR | Status: DC | PRN

## 2023-01-05 MED ORDER — CEFAZOLIN SODIUM-DEXTROSE 1-4 GM/50ML-% IV SOLN
1.0000 g | INTRAVENOUS | Status: AC
  Administered 2023-01-05: 1 g via INTRAVENOUS

## 2023-01-05 MED ORDER — SUCCINYLCHOLINE CHLORIDE 200 MG/10ML IV SOSY
PREFILLED_SYRINGE | INTRAVENOUS | Status: AC
  Filled 2023-01-05: qty 10

## 2023-01-05 SURGICAL SUPPLY — 16 items
BALLN DORADO 8X60X80 (BALLOONS) ×1
BALLN LUTONIX AV 8X40X75 (BALLOONS) ×1
BALLN LUTONIX AV 8X60X75 (BALLOONS) ×1
BALLOON DORADO 8X60X80 (BALLOONS) IMPLANT
BALLOON LUTONIX AV 8X40X75 (BALLOONS) IMPLANT
BALLOON LUTONIX AV 8X60X75 (BALLOONS) IMPLANT
CANNULA 5F STIFF (CANNULA) IMPLANT
COVER PROBE ULTRASOUND 5X96 (MISCELLANEOUS) IMPLANT
DEVICE PRESTO INFLATION (MISCELLANEOUS) IMPLANT
DRAPE BRACHIAL (DRAPES) IMPLANT
GOWN STRL REUS W/ TWL LRG LVL3 (GOWN DISPOSABLE) ×1 IMPLANT
MARKER SKIN DUAL TIP RULER LAB (MISCELLANEOUS) IMPLANT
PACK ANGIOGRAPHY (CUSTOM PROCEDURE TRAY) ×1 IMPLANT
SHEATH BRITE TIP 6FRX5.5 (SHEATH) IMPLANT
SUT MNCRL AB 4-0 PS2 18 (SUTURE) IMPLANT
WIRE SUPRACORE 190CM (WIRE) IMPLANT

## 2023-01-05 NOTE — Anesthesia Preprocedure Evaluation (Addendum)
Anesthesia Evaluation  Patient identified by MRN, date of birth, ID band Patient awake    Reviewed: Allergy & Precautions, NPO status , Patient's Chart, lab work & pertinent test results  History of Anesthesia Complications (+) DIFFICULT AIRWAY and history of anesthetic complications (last intubation 06/2021 grade I view with McGrath)  Airway Mallampati: I   Neck ROM: Full    Dental no notable dental hx.    Pulmonary sleep apnea (unable to lie flat at night) and Continuous Positive Airway Pressure Ventilation , COPD (2L home O2 at night)   Pulmonary exam normal breath sounds clear to auscultation       Cardiovascular hypertension, +CHF  Normal cardiovascular exam+ dysrhythmias (a fib on Eliquis)  Rhythm:Regular Rate:Normal  Hx PE  ECG 09/30/22:  Atrial fibrillation Low voltage, precordial leads   Neuro/Psych    GI/Hepatic negative GI ROS,,,  Endo/Other  diabetes, Type 2Hypothyroidism  Class 4 obesity  Renal/GU ESRF and DialysisRenal disease (last HD 01/04/23)     Musculoskeletal  (+) Arthritis ,  Gout    Abdominal   Peds  Hematology  (+) Blood dyscrasia, anemia Breast CA   Anesthesia Other Findings Last dose of Trulicity 01/01/23.   Cardiology note 07/26/22:  1. Permanent atrial fibrillation: Ventricular rate is reasonably controlled on Toprol 25 mg once daily.  Continue anticoagulation with Eliquis 5 mg twice daily.     2. Chronic diastolic heart failure: She appears to be euvolemic.  Fluid status is managed by dialysis.   3.  Essential hypertension: Blood pressure has been running low especially on days of dialysis.  I elected to decrease clonidine to 0.1 mg twice daily.   4. Morbid obesity: She is trying to lose weight but has not been successful.   5. History of pulmonary embolism: On anticoagulation with Eliquis.   6.  Obstructive sleep apnea: On CPAP   7.  End-stage renal disease on hemodialysis  managed by nephrology   Disposition:   FU with me in 6 months  Reproductive/Obstetrics                             Anesthesia Physical Anesthesia Plan  ASA: 4  Anesthesia Plan: General   Post-op Pain Management:    Induction: Intravenous  PONV Risk Score and Plan: 3 and Ondansetron, Dexamethasone and Treatment may vary due to age or medical condition  Airway Management Planned: Oral ETT  Additional Equipment:   Intra-op Plan:   Post-operative Plan: Extubation in OR  Informed Consent: I have reviewed the patients History and Physical, chart, labs and discussed the procedure including the risks, benefits and alternatives for the proposed anesthesia with the patient or authorized representative who has indicated his/her understanding and acceptance.     Dental advisory given  Plan Discussed with: CRNA  Anesthesia Plan Comments: (Patient consented for risks of anesthesia including but not limited to:  - adverse reactions to medications - damage to eyes, teeth, lips or other oral mucosa - nerve damage due to positioning  - sore throat or hoarseness - damage to heart, brain, nerves, lungs, other parts of body or loss of life  Informed patient about role of CRNA in peri- and intra-operative care.  Patient voiced understanding.)        Anesthesia Quick Evaluation

## 2023-01-05 NOTE — Progress Notes (Signed)
MRN : [id number]   Nicole Schmidt is a 58 y.o. (March 30, 1965) female who presents with chief complaint of check access.   History of Present Illness:    Patient presents to [facility] for evaluation of her AV access.   The patient notes a significant increase in problems with dialysis.  It is reported that adequate dialysis is not being achieved.    The patient denies hand pain or other symptoms consistent with steal phenomena.  No significant arm swelling.   The patient denies redness or swelling at the access site. The patient denies fever or chills at home or while on dialysis.   No recent shortening of the patient's walking distance or new symptoms consistent with claudication.  No history of rest pain symptoms. No new ulcers or wounds of the lower extremities have occurred.   The patient denies amaurosis fugax or recent TIA symptoms. There are no recent neurological changes noted. There is no history of DVT, PE or superficial thrombophlebitis. No recent episodes of angina or shortness of breath documented.    Duplex ultrasound of the AV access shows a patent access.  The previously noted stenosis is significantly increased compared to last study.  Flow volume today is 927 cc/min (previous flow volume was 1401 cc/min)   Active Medications      Current Meds  Medication Sig   atorvastatin (LIPITOR) 40 MG tablet Take 40 mg by mouth daily.   calcium acetate (PHOSLO) 667 MG capsule Take 667 mg by mouth 3 (three) times daily.   cinacalcet (SENSIPAR) 30 MG tablet Take 30 mg by mouth daily.   cloNIDine (CATAPRES) 0.1 MG tablet Take 1 tablet (0.1 mg total) by mouth 2 (two) times daily.   ezetimibe (ZETIA) 10 MG tablet Take 10 mg by mouth daily with supper.   ferric gluconate 250 mg in sodium chloride 0.9 % 250 mL Inject 250 mg into the vein Every Tuesday,Thursday,and Saturday with dialysis.   fluticasone-salmeterol (ADVAIR  DISKUS) 100-50 MCG/ACT AEPB Inhale 1 puff into the lungs 2 (two) times daily.   gabapentin (NEURONTIN) 300 MG capsule Take 1 capsule (300 mg total) by mouth at bedtime.   levothyroxine (SYNTHROID) 112 MCG tablet Take 1 tablet (112 mcg total) by mouth daily before breakfast.   metoprolol succinate (TOPROL-XL) 25 MG 24 hr tablet Take 1 tablet (25 mg total) by mouth at bedtime. Take with or immediately following a meal.   midodrine (PROAMATINE) 10 MG tablet Take 1 tablet (10 mg total) by mouth Every Tuesday,Thursday,and Saturday with dialysis.   SSD 1 % cream Apply topically 2 (two) times daily.   VELPHORO 500 MG chewable tablet Chew 1,000 mg by mouth 3 (three) times daily.            Past Medical History:  Diagnosis Date   (HFpEF) heart failure with preserved ejection fraction (HCC)      a. 10/2018 Echo: EF 60-65%, diast dysfxn, RVSP 50.7 mmHg; b. 01/2020 Echo: EF 65-70%, G2DD; c. 01/2021 Echo: EF 60-65%, no rwma, mod LVH. Nl RV fxn, mildly enlarged RV. Mod BAE. Triv MR. d. 05/2021 Echo: EF 55-60; no RWMAs, mild LVH, mild TR.   Acquired hypothyroidism 02/09/2020   Adenoma of left adrenal gland     Anemia     Aortic atherosclerosis (HCC)     Arthritis     Breast cancer of upper-inner quadrant of right female breast (HCC)      Right breast invasive  CA and DCIS , 7 mm T1,N0,M0. Er/PR pos, her 2 negative.  Margins 1 mm.   COPD (chronic obstructive pulmonary disease) (HCC)     Diabetes mellitus type 2 in obese 02/09/2020   Difficult intubation 09/28/2021    a.) mallampati III-IV; BMI >55 kg/m   Diverticulosis     Dyslipidemia 02/09/2020   ESBL (extended spectrum beta-lactamase) producing bacteria infection 03/20/2021    a. urine culture (+) for ESBL producing E.coli and P. mirabilis   ESRD (end stage renal disease) on dialysis (HCC)      a. T-Th-Sat HD initiated 02/2021   Essential hypertension     Gout     History of kidney stones     Long term current use of anticoagulant      a.)  apixaban   Menopause      age 65   Morbid obesity (HCC)     OSA treated with BiPAP      a. on nocturnal PAP therapy; Trilogy 100 ventilator   Permanent atrial fibrillation (HCC)      a. Dx 10/2018 in setting of PNA. b. CHA2DS2-VASc = 7 (sex, HFpEF, HTN, PE x2, aortic plaque, T2DM); c. s/p successful DCCV 11/2018. d. subsequent recurrent Afib; rate/rhythm maintained on oral metoprolol succinate; chronically anticoagulated using standard dose apixaban.   Personal history of radiation therapy     Pulmonary embolism (HCC) 10/2014    a. on chronic apixaban   Restrictive lung disease     Shingles 10/24/2021   Sleep difficulties      a.) takes melatonin   Thyroid goiter                 Past Surgical History:  Procedure Laterality Date   A/V FISTULAGRAM Left 09/02/2021    Procedure: A/V Fistulagram;  Surgeon: Renford Dills, MD;  Location: [facility];  Service: Cardiovascular;  Laterality: Left;   A/V FISTULAGRAM Left 06/27/2022    Procedure: A/V Fistulagram;  Surgeon: Renford Dills, MD;  Location: [facility];  Service: Cardiovascular;  Laterality: Left;   AV FISTULA PLACEMENT Left 07/22/2021    Procedure: ARTERIOVENOUS (AV) FISTULA CREATION (RADIALCEPHALIC);  Surgeon: Renford Dills, MD;  Location: [facility];  Service: Vascular;  Laterality: Left;   AV FISTULA PLACEMENT Left 12/09/2021    Procedure: ARTERIOVENOUS (AV) FISTULA CREATION (BRACHIAL CEPHALIC);  Surgeon: Renford Dills, MD;  Location: [facility];  Service: Vascular;  Laterality: Left;   BREAST BIOPSY Right 2014    breast ca   BREAST EXCISIONAL BIOPSY Right 07/19/2012    breast ca   BREAST SURGERY Right 2014    with sentinel node bx subareaolar duct excision   CARDIOVERSION N/A 11/28/2018    Procedure: CARDIOVERSION;  Surgeon: Antonieta Iba, MD;  Location: [facility];  Service: Cardiovascular;  Laterality: N/A;   COLONOSCOPY WITH PROPOFOL N/A 01/30/2017    Procedure: COLONOSCOPY WITH PROPOFOL;   Surgeon: Kieth Brightly, MD;  Location: [facility];  Service: Endoscopy;  Laterality: N/A;   CYSTOSCOPY W/ RETROGRADES   06/17/2021    Procedure: CYSTOSCOPY WITH RETROGRADE PYELOGRAM;  Surgeon: Sondra Come, MD;  Location: [facility];  Service: Urology;;   CYSTOSCOPY W/ URETERAL STENT PLACEMENT Left 02/14/2021    Procedure: CYSTOSCOPY WITH RETROGRADE PYELOGRAM/URETERAL STENT PLACEMENT;  Surgeon: Sondra Come, MD;  Location: [facility];  Service: Urology;  Laterality: Left;   CYSTOSCOPY/URETEROSCOPY/HOLMIUM LASER/STENT PLACEMENT Left 06/17/2021    Procedure: CYSTOSCOPY/URETEROSCOPY/HOLMIUM LASER/STENT EXCHANGE;  Surgeon: Sondra Come,  MD;  Location: [facility];  Service: Urology;  Laterality: Left;   DIALYSIS/PERMA CATHETER INSERTION N/A 02/24/2021    Procedure: DIALYSIS/PERMA CATHETER INSERTION;  Surgeon: Annice Needy, MD;  Location: [facility];  Service: Cardiovascular;  Laterality: N/A;   DIALYSIS/PERMA CATHETER INSERTION N/A 12/16/2021    Procedure: DIALYSIS/PERMA CATHETER INSERTION;  Surgeon: Renford Dills, MD;  Location: [facility];  Service: Cardiovascular;  Laterality: N/A;   DIALYSIS/PERMA CATHETER REMOVAL N/A 09/04/2022    Procedure: DIALYSIS/PERMA CATHETER REMOVAL;  Surgeon: Annice Needy, MD;  Location: [facility];  Service: Cardiovascular;  Laterality: N/A;   PERIPHERAL VASCULAR THROMBECTOMY Left 09/28/2021    Procedure: PERIPHERAL VASCULAR THROMBECTOMY;  Surgeon: Renford Dills, MD;  Location: [facility];  Service: Cardiovascular;  Laterality: Left;   REVISON OF ARTERIOVENOUS FISTULA Left 04/28/2022    Procedure: REVISON OF ARTERIOVENOUS FISTULA (BRACHIAL CEPHALIC);  Surgeon: Renford Dills, MD;  Location: [facility];  Service: Vascular;  Laterality: Left;          Social History Social History  Social History         Tobacco Use   Smoking status: Never      Passive exposure: Never   Smokeless tobacco: Never  Vaping  Use   Vaping status: Never Used  Substance Use Topics   Alcohol use: No      Alcohol/week: 0.0 standard drinks of alcohol   Drug use: No        Family History      Family History  Problem Relation Age of Onset   Diabetes Father     Hypertension Father     Parkinson's disease Father     Hypertension Mother     Rheum arthritis Mother     Atrial fibrillation Mother     Heart failure Mother     Heart attack Mother          died in her mid-70's.   Colon cancer Cousin 54   Breast cancer Neg Hx            Allergies       Allergies  Allergen Reactions   Spironolactone        Hyperkalemia   Pt unaware of this allergy          REVIEW OF SYSTEMS (Negative unless checked)   Constitutional: [] Weight loss  [] Fever  [] Chills Cardiac: [] Chest pain   [] Chest pressure   [] Palpitations   [] Shortness of breath when laying flat   [] Shortness of breath with exertion. Vascular:  [] Pain in legs with walking   [] Pain in legs at rest  [] History of DVT   [] Phlebitis   [] Swelling in legs   [] Varicose veins   [] Non-healing ulcers Pulmonary:   [] Uses home oxygen   [] Productive cough   [] Hemoptysis   [] Wheeze  [] COPD   [] Asthma Neurologic:  [] Dizziness   [] Seizures   [] History of stroke   [] History of TIA  [] Aphasia   [] Vissual changes   [] Weakness or numbness in arm   [] Weakness or numbness in leg Musculoskeletal:   [] Joint swelling   [] Joint pain   [] Low back pain Hematologic:  [] Easy bruising  [] Easy bleeding   [] Hypercoagulable state   [] Anemic Gastrointestinal:  [] Diarrhea   [] Vomiting  [] Gastroesophageal reflux/heartburn   [] Difficulty swallowing. Genitourinary:  [x] Chronic kidney disease   [] Difficult urination  [] Frequent urination   [] Blood in urine Skin:  [] Rashes   [] Ulcers  Psychological:  [] History of anxiety   []   History of major depression.   Physical Examination      Vitals:    01/05/23 1242  BP: (!) 108/54  Pulse: 92  Resp: 20  Temp: 97.7 F (36.5 C)  TempSrc: Oral   SpO2: 96%  Weight: (!) 159 kg  Height: 5\' 1"  (1.549 m)    Body mass index is 66.23 kg/m. Gen: WD/WN, NAD Head: [place]/AT, No temporalis wasting.  Ear/Nose/Throat: Hearing grossly intact, nares w/o erythema or drainage Eyes: PER, EOMI, sclera nonicteric.  Neck: Supple, no gross masses or lesions.  No JVD.  Pulmonary:  Good air movement, no audible wheezing, no use of accessory muscles.  Cardiac: RRR, precordium non-hyperdynamic. Vascular:   Weak thrill weak bruit left arm fistula Vessel Right Left  Radial Palpable Palpable  Brachial Palpable Palpable  Gastrointestinal: soft, non-distended. No guarding/no peritoneal signs.  Musculoskeletal: M/S 5/5 throughout.  No deformity.  Neurologic: CN 2-12 intact. Pain and light touch intact in extremities.  Symmetrical.  Speech is fluent. Motor exam as listed above. Psychiatric: Judgment intact, Mood & affect appropriate for pt's clinical situation. Dermatologic: No rashes or ulcers noted.  No changes consistent with cellulitis.     CBC Recent Labs       Lab Results  Component Value Date    WBC 11.3 (H) 09/30/2022    HGB 12.4 09/30/2022    HCT 41.0 09/30/2022    MCV 115.8 (H) 09/30/2022    PLT 156 09/30/2022        BMET Labs (Brief)          Component Value Date/Time    NA 136 09/30/2022 1055    NA 144 02/27/2020 0931    NA 141 03/09/2014 1533    K 5.0 09/30/2022 1055    K 4.2 03/09/2014 1533    CL 99 09/30/2022 1055    CL 101 03/09/2014 1533    CO2 27 09/30/2022 1055    CO2 30 03/09/2014 1533    GLUCOSE 180 (H) 09/30/2022 1055    GLUCOSE 95 03/09/2014 1533    BUN 50 (H) 09/30/2022 1055    BUN 40 (H) 02/27/2020 0931    BUN 35 (H) 03/09/2014 1533    CREATININE 8.93 (H) 09/30/2022 1055    CREATININE 1.50 (H) 03/09/2014 1533    CALCIUM 8.2 (L) 09/30/2022 1055    CALCIUM 8.8 03/09/2014 1533    GFRNONAA 5 (L) 09/30/2022 1055    GFRNONAA 39 (L) 03/09/2014 1533    GFRNONAA 55 (L) 07/31/2013 1521    GFRAA 35 (L) 02/27/2020  0931    GFRAA 47 (L) 03/09/2014 1533    GFRAA >60 07/31/2013 1521      CrCl cannot be calculated (Patient's most recent lab result is older than the maximum 21 days allowed.).   COAG Recent Labs       Lab Results  Component Value Date    INR 1.1 02/15/2021    INR 1.35 01/12/2018    INR 1.27 11/16/2017        Radiology Imaging Results  No results found.       Assessment/Plan 1. ESRD on hemodialysis [facility]) Recommend:   The patient is experiencing increasing problems with their dialysis access.   Patient should have a fistulagram with the intention for intervention.  The intention for intervention is to restore appropriate flow and prevent thrombosis and possible loss of the access.  As well as improve the quality of dialysis therapy.   The risks, benefits and alternative therapies were reviewed in  detail with the patient.  All questions were answered.  The patient agrees to proceed with angio/intervention.     The patient will follow up with me in the office after the procedure.     2. Type 2 diabetes mellitus with obesity (HCC) Continue hypoglycemic medications as already ordered, these medications have been reviewed and there are no changes at this time.   Hgb A1C to be monitored as already arranged by primary service   3. Essential hypertension Continue antihypertensive medications as already ordered, these medications have been reviewed and there are no changes at this time.   4. Atrial fibrillation, chronic (HCC) Continue antiarrhythmia medications as already ordered, these medications have been reviewed and there are no changes at this time.   Continue anticoagulation as ordered by Cardiology Service   5. Dyslipidemia Continue statin as ordered and reviewed, no changes at this time     Levora Dredge, MD   01/05/2023 1:12 PM            Electronically signed by Renford Dills, MD at 01/05/2023  1:13 PM     Admission (Current) on 01/05/2023        Detailed Report      Note shared with patient

## 2023-01-05 NOTE — Anesthesia Postprocedure Evaluation (Signed)
Anesthesia Post Note  Patient: Nicole Schmidt  Procedure(s) Performed: A/V Fistulagram (Left)  Patient location during evaluation: PACU Anesthesia Type: General Level of consciousness: awake and awake and alert Pain management: satisfactory to patient Vital Signs Assessment: post-procedure vital signs reviewed and stable Cardiovascular status: blood pressure returned to baseline Anesthetic complications: no   Encounter Notable Events  Notable Event Outcome Phase Comment  None  Intraprocedure      Last Vitals:  Vitals:   01/05/23 1242 01/05/23 1415  BP: (!) 108/54 (!) 102/56  Pulse: 92 97  Resp: 20 18  Temp: 36.5 C 36.5 C  SpO2: 96% 100%    Last Pain:  Vitals:   01/05/23 1415  TempSrc:   PainSc: 0-No pain                 VAN STAVEREN,Uri Covey

## 2023-01-05 NOTE — Anesthesia Procedure Notes (Signed)
Procedure Name: Intubation Date/Time: 01/05/2023 1:33 PM  Performed by: Mathews Argyle, CRNAPre-anesthesia Checklist: Patient identified, Patient being monitored, Timeout performed, Emergency Drugs available and Suction available Patient Re-evaluated:Patient Re-evaluated prior to induction Oxygen Delivery Method: Circle system utilized Preoxygenation: Pre-oxygenation with 100% oxygen Induction Type: IV induction and Rapid sequence Laryngoscope Size: 3 and McGrath Grade View: Grade I Tube type: Oral Tube size: 7.0 mm Number of attempts: 1 Airway Equipment and Method: Stylet and Video-laryngoscopy Placement Confirmation: ETT inserted through vocal cords under direct vision, positive ETCO2 and breath sounds checked- equal and bilateral Secured at: 21 cm Tube secured with: Tape Dental Injury: Teeth and Oropharynx as per pre-operative assessment

## 2023-01-05 NOTE — Op Note (Deleted)
MRN : [id number]  Nicole Schmidt is a 57 y.o. (Aug 27, 1965) female who presents with chief complaint of check access.  History of Present Illness:   Patient presents to [facility] for evaluation of her AV access.  The patient notes a significant increase in problems with dialysis.  It is reported that adequate dialysis is not being achieved.    The patient denies hand pain or other symptoms consistent with steal phenomena.  No significant arm swelling.   The patient denies redness or swelling at the access site. The patient denies fever or chills at home or while on dialysis.   No recent shortening of the patient's walking distance or new symptoms consistent with claudication.  No history of rest pain symptoms. No new ulcers or wounds of the lower extremities have occurred.   The patient denies amaurosis fugax or recent TIA symptoms. There are no recent neurological changes noted. There is no history of DVT, PE or superficial thrombophlebitis. No recent episodes of angina or shortness of breath documented.    Duplex ultrasound of the AV access shows a patent access.  The previously noted stenosis is significantly increased compared to last study.  Flow volume today is 927 cc/min (previous flow volume was 1401 cc/min)  Current Meds  Medication Sig   atorvastatin (LIPITOR) 40 MG tablet Take 40 mg by mouth daily.   calcium acetate (PHOSLO) 667 MG capsule Take 667 mg by mouth 3 (three) times daily.   cinacalcet (SENSIPAR) 30 MG tablet Take 30 mg by mouth daily.   cloNIDine (CATAPRES) 0.1 MG tablet Take 1 tablet (0.1 mg total) by mouth 2 (two) times daily.   ezetimibe (ZETIA) 10 MG tablet Take 10 mg by mouth daily with supper.   ferric gluconate 250 mg in sodium chloride 0.9 % 250 mL Inject 250 mg into the vein Every Tuesday,Thursday,and Saturday with dialysis.   fluticasone-salmeterol (ADVAIR DISKUS) 100-50 MCG/ACT AEPB Inhale 1 puff into the  lungs 2 (two) times daily.   gabapentin (NEURONTIN) 300 MG capsule Take 1 capsule (300 mg total) by mouth at bedtime.   levothyroxine (SYNTHROID) 112 MCG tablet Take 1 tablet (112 mcg total) by mouth daily before breakfast.   metoprolol succinate (TOPROL-XL) 25 MG 24 hr tablet Take 1 tablet (25 mg total) by mouth at bedtime. Take with or immediately following a meal.   midodrine (PROAMATINE) 10 MG tablet Take 1 tablet (10 mg total) by mouth Every Tuesday,Thursday,and Saturday with dialysis.   SSD 1 % cream Apply topically 2 (two) times daily.   VELPHORO 500 MG chewable tablet Chew 1,000 mg by mouth 3 (three) times daily.    Past Medical History:  Diagnosis Date   (HFpEF) heart failure with preserved ejection fraction (HCC)    a. 10/2018 Echo: EF 60-65%, diast dysfxn, RVSP 50.7 mmHg; b. 01/2020 Echo: EF 65-70%, G2DD; c. 01/2021 Echo: EF 60-65%, no rwma, mod LVH. Nl RV fxn, mildly enlarged RV. Mod BAE. Triv MR. d. 05/2021 Echo: EF 55-60; no RWMAs, mild LVH, mild TR.   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 Er/PR pos, her 2 negative.  Margins  1 mm.   COPD (chronic obstructive pulmonary disease) (HCC)    Diabetes mellitus type 2 in obese 02/09/2020   Difficult intubation 09/28/2021   a.) mallampati III-IV; BMI >55 kg/m   Diverticulosis    Dyslipidemia 02/09/2020   ESBL (extended spectrum beta-lactamase) producing bacteria infection 03/20/2021   a. urine culture (+) for ESBL producing E.coli and P. mirabilis   ESRD (end stage renal disease) on dialysis (HCC)    a. T-Th-Sat HD initiated 02/2021   Essential hypertension    Gout    History of kidney stones    Long term current use of anticoagulant    a.) apixaban   Menopause    age 70   Morbid obesity (HCC)    OSA treated with BiPAP     a. on nocturnal PAP therapy; Trilogy 100 ventilator   Permanent atrial fibrillation (HCC)    a. Dx 10/2018 in setting of PNA. b. CHA2DS2-VASc = 7 (sex, HFpEF, HTN, PE x2, aortic plaque, T2DM); c. s/p successful DCCV 11/2018. d. subsequent recurrent Afib; rate/rhythm maintained on oral metoprolol succinate; chronically anticoagulated using standard dose apixaban.   Personal history of radiation therapy    Pulmonary embolism (HCC) 10/2014   a. on chronic apixaban   Restrictive lung disease    Shingles 10/24/2021   Sleep difficulties    a.) takes melatonin   Thyroid goiter     Past Surgical History:  Procedure Laterality Date   A/V FISTULAGRAM Left 09/02/2021   Procedure: A/V Fistulagram;  Surgeon: Renford Dills, MD;  Location: [facility];  Service: Cardiovascular;  Laterality: Left;   A/V FISTULAGRAM Left 06/27/2022   Procedure: A/V Fistulagram;  Surgeon: Renford Dills, MD;  Location: [facility];  Service: Cardiovascular;  Laterality: Left;   AV FISTULA PLACEMENT Left 07/22/2021   Procedure: ARTERIOVENOUS (AV) FISTULA CREATION (RADIALCEPHALIC);  Surgeon: Renford Dills, MD;  Location: [facility];  Service: Vascular;  Laterality: Left;   AV FISTULA PLACEMENT Left 12/09/2021   Procedure: ARTERIOVENOUS (AV) FISTULA CREATION (BRACHIAL CEPHALIC);  Surgeon: Renford Dills, MD;  Location: [facility];  Service: Vascular;  Laterality: Left;   BREAST BIOPSY Right 2014   breast ca   BREAST EXCISIONAL BIOPSY Right 07/19/2012   breast ca   BREAST SURGERY Right 2014   with sentinel node bx subareaolar duct excision   CARDIOVERSION N/A 11/28/2018   Procedure: CARDIOVERSION;  Surgeon: Antonieta Iba, MD;  Location: [facility];  Service: Cardiovascular;  Laterality: N/A;   COLONOSCOPY WITH PROPOFOL N/A 01/30/2017   Procedure: COLONOSCOPY WITH PROPOFOL;  Surgeon: Kieth Brightly, MD;  Location: [facility];  Service: Endoscopy;  Laterality: N/A;   CYSTOSCOPY W/  RETROGRADES  06/17/2021   Procedure: CYSTOSCOPY WITH RETROGRADE PYELOGRAM;  Surgeon: Sondra Come, MD;  Location: [facility];  Service: Urology;;   CYSTOSCOPY W/ URETERAL STENT PLACEMENT Left 02/14/2021   Procedure: CYSTOSCOPY WITH RETROGRADE PYELOGRAM/URETERAL STENT PLACEMENT;  Surgeon: Sondra Come, MD;  Location: [facility];  Service: Urology;  Laterality: Left;   CYSTOSCOPY/URETEROSCOPY/HOLMIUM LASER/STENT PLACEMENT Left 06/17/2021   Procedure: CYSTOSCOPY/URETEROSCOPY/HOLMIUM LASER/STENT EXCHANGE;  Surgeon: Sondra Come, MD;  Location: [facility];  Service: Urology;  Laterality: Left;   DIALYSIS/PERMA CATHETER INSERTION N/A 02/24/2021   Procedure: DIALYSIS/PERMA CATHETER INSERTION;  Surgeon: Annice Needy, MD;  Location: [facility];  Service: Cardiovascular;  Laterality: N/A;   DIALYSIS/PERMA CATHETER INSERTION N/A 12/16/2021   Procedure: DIALYSIS/PERMA CATHETER INSERTION;  Surgeon: Renford Dills, MD;  Location: [facility];  Service: Cardiovascular;  Laterality: N/A;   DIALYSIS/PERMA CATHETER REMOVAL N/A 09/04/2022   Procedure: DIALYSIS/PERMA CATHETER REMOVAL;  Surgeon: Annice Needy, MD;  Location: [facility];  Service: Cardiovascular;  Laterality: N/A;   PERIPHERAL VASCULAR THROMBECTOMY Left 09/28/2021   Procedure: PERIPHERAL VASCULAR THROMBECTOMY;  Surgeon: Renford Dills, MD;  Location: [facility];  Service: Cardiovascular;  Laterality: Left;   REVISON OF ARTERIOVENOUS FISTULA Left 04/28/2022   Procedure: REVISON OF ARTERIOVENOUS FISTULA (BRACHIAL CEPHALIC);  Surgeon: Renford Dills, MD;  Location: [facility];  Service: Vascular;  Laterality: Left;    Social History Social History   Tobacco Use   Smoking status: Never    Passive exposure: Never   Smokeless tobacco: Never  Vaping Use   Vaping status: Never Used  Substance Use Topics   Alcohol use: No    Alcohol/week: 0.0 standard drinks of alcohol   Drug use: No    Family  History Family History  Problem Relation Age of Onset   Diabetes Father    Hypertension Father    Parkinson's disease Father    Hypertension Mother    Rheum arthritis Mother    Atrial fibrillation Mother    Heart failure Mother    Heart attack Mother        died in her mid-70's.   Colon cancer Cousin 54   Breast cancer Neg Hx     Allergies  Allergen Reactions   Spironolactone     Hyperkalemia  Pt unaware of this allergy     REVIEW OF SYSTEMS (Negative unless checked)  Constitutional: [] Weight loss  [] Fever  [] Chills Cardiac: [] Chest pain   [] Chest pressure   [] Palpitations   [] Shortness of breath when laying flat   [] Shortness of breath with exertion. Vascular:  [] Pain in legs with walking   [] Pain in legs at rest  [] History of DVT   [] Phlebitis   [] Swelling in legs   [] Varicose veins   [] Non-healing ulcers Pulmonary:   [] Uses home oxygen   [] Productive cough   [] Hemoptysis   [] Wheeze  [] COPD   [] Asthma Neurologic:  [] Dizziness   [] Seizures   [] History of stroke   [] History of TIA  [] Aphasia   [] Vissual changes   [] Weakness or numbness in arm   [] Weakness or numbness in leg Musculoskeletal:   [] Joint swelling   [] Joint pain   [] Low back pain Hematologic:  [] Easy bruising  [] Easy bleeding   [] Hypercoagulable state   [] Anemic Gastrointestinal:  [] Diarrhea   [] Vomiting  [] Gastroesophageal reflux/heartburn   [] Difficulty swallowing. Genitourinary:  [x] Chronic kidney disease   [] Difficult urination  [] Frequent urination   [] Blood in urine Skin:  [] Rashes   [] Ulcers  Psychological:  [] History of anxiety   []  History of major depression.  Physical Examination  Vitals:   01/05/23 1242  BP: (!) 108/54  Pulse: 92  Resp: 20  Temp: 97.7 F (36.5 C)  TempSrc: Oral  SpO2: 96%  Weight: (!) 159 kg  Height: 5\' 1"  (1.549 m)   Body mass index is 66.23 kg/m. Gen: WD/WN, NAD Head: [place]/AT, No temporalis wasting.  Ear/Nose/Throat: Hearing grossly intact, nares w/o erythema or  drainage Eyes: PER, EOMI, sclera nonicteric.  Neck: Supple, no gross masses or lesions.  No JVD.  Pulmonary:  Good air movement, no audible wheezing, no use of accessory muscles.  Cardiac: RRR, precordium non-hyperdynamic. Vascular:   Weak thrill weak bruit left arm fistula Vessel Right Left  Radial Palpable Palpable  Brachial Palpable Palpable  Gastrointestinal: soft, non-distended. No guarding/no peritoneal signs.  Musculoskeletal: M/S 5/5 throughout.  No deformity.  Neurologic: CN 2-12 intact. Pain and light touch intact in extremities.  Symmetrical.  Speech is fluent. Motor exam as listed above. Psychiatric: Judgment intact, Mood & affect appropriate for pt's clinical situation. Dermatologic: No rashes or ulcers noted.  No changes consistent with cellulitis.   CBC Lab Results  Component Value Date   WBC 11.3 (H) 09/30/2022   HGB 12.4 09/30/2022   HCT 41.0 09/30/2022   MCV 115.8 (H) 09/30/2022   PLT 156 09/30/2022    BMET    Component Value Date/Time   NA 136 09/30/2022 1055   NA 144 02/27/2020 0931   NA 141 03/09/2014 1533   K 5.0 09/30/2022 1055   K 4.2 03/09/2014 1533   CL 99 09/30/2022 1055   CL 101 03/09/2014 1533   CO2 27 09/30/2022 1055   CO2 30 03/09/2014 1533   GLUCOSE 180 (H) 09/30/2022 1055   GLUCOSE 95 03/09/2014 1533   BUN 50 (H) 09/30/2022 1055   BUN 40 (H) 02/27/2020 0931   BUN 35 (H) 03/09/2014 1533   CREATININE 8.93 (H) 09/30/2022 1055   CREATININE 1.50 (H) 03/09/2014 1533   CALCIUM 8.2 (L) 09/30/2022 1055   CALCIUM 8.8 03/09/2014 1533   GFRNONAA 5 (L) 09/30/2022 1055   GFRNONAA 39 (L) 03/09/2014 1533   GFRNONAA 55 (L) 07/31/2013 1521   GFRAA 35 (L) 02/27/2020 0931   GFRAA 47 (L) 03/09/2014 1533   GFRAA >60 07/31/2013 1521   CrCl cannot be calculated (Patient's most recent lab result is older than the maximum 21 days allowed.).  COAG Lab Results  Component Value Date   INR 1.1 02/15/2021   INR 1.35 01/12/2018   INR 1.27 11/16/2017     Radiology No results found.   Assessment/Plan 1. ESRD on hemodialysis [facility]) Recommend:   The patient is experiencing increasing problems with their dialysis access.   Patient should have a fistulagram with the intention for intervention.  The intention for intervention is to restore appropriate flow and prevent thrombosis and possible loss of the access.  As well as improve the quality of dialysis therapy.   The risks, benefits and alternative therapies were reviewed in detail with the patient.  All questions were answered.  The patient agrees to proceed with angio/intervention.     The patient will follow up with me in the office after the procedure.     2. Type 2 diabetes mellitus with obesity (HCC) Continue hypoglycemic medications as already ordered, these medications have been reviewed and there are no changes at this time.   Hgb A1C to be monitored as already arranged by primary service   3. Essential hypertension Continue antihypertensive medications as already ordered, these medications have been reviewed and there are no changes at this time.   4. Atrial fibrillation, chronic (HCC) Continue antiarrhythmia medications as already ordered, these medications have been reviewed and there are no changes at this time.   Continue anticoagulation as ordered by Cardiology Service   5. Dyslipidemia Continue statin as ordered and reviewed, no changes at this time   Levora Dredge, MD  01/05/2023 1:12 PM

## 2023-01-05 NOTE — H&P (View-Only) (Signed)
MRN : [id number]   Nicole Schmidt is a 57 y.o. (1965/08/03) female who presents with chief complaint of check access.   History of Present Illness:    Patient presents to [facility] for evaluation of her AV access.   The patient notes a significant increase in problems with dialysis.  It is reported that adequate dialysis is not being achieved.    The patient denies hand pain or other symptoms consistent with steal phenomena.  No significant arm swelling.   The patient denies redness or swelling at the access site. The patient denies fever or chills at home or while on dialysis.   No recent shortening of the patient's walking distance or new symptoms consistent with claudication.  No history of rest pain symptoms. No new ulcers or wounds of the lower extremities have occurred.   The patient denies amaurosis fugax or recent TIA symptoms. There are no recent neurological changes noted. There is no history of DVT, PE or superficial thrombophlebitis. No recent episodes of angina or shortness of breath documented.    Duplex ultrasound of the AV access shows a patent access.  The previously noted stenosis is significantly increased compared to last study.  Flow volume today is 927 cc/min (previous flow volume was 1401 cc/min)   Active Medications      Current Meds  Medication Sig   atorvastatin (LIPITOR) 40 MG tablet Take 40 mg by mouth daily.   calcium acetate (PHOSLO) 667 MG capsule Take 667 mg by mouth 3 (three) times daily.   cinacalcet (SENSIPAR) 30 MG tablet Take 30 mg by mouth daily.   cloNIDine (CATAPRES) 0.1 MG tablet Take 1 tablet (0.1 mg total) by mouth 2 (two) times daily.   ezetimibe (ZETIA) 10 MG tablet Take 10 mg by mouth daily with supper.   ferric gluconate 250 mg in sodium chloride 0.9 % 250 mL Inject 250 mg into the vein Every Tuesday,Thursday,and Saturday with dialysis.   fluticasone-salmeterol (ADVAIR  DISKUS) 100-50 MCG/ACT AEPB Inhale 1 puff into the lungs 2 (two) times daily.   gabapentin (NEURONTIN) 300 MG capsule Take 1 capsule (300 mg total) by mouth at bedtime.   levothyroxine (SYNTHROID) 112 MCG tablet Take 1 tablet (112 mcg total) by mouth daily before breakfast.   metoprolol succinate (TOPROL-XL) 25 MG 24 hr tablet Take 1 tablet (25 mg total) by mouth at bedtime. Take with or immediately following a meal.   midodrine (PROAMATINE) 10 MG tablet Take 1 tablet (10 mg total) by mouth Every Tuesday,Thursday,and Saturday with dialysis.   SSD 1 % cream Apply topically 2 (two) times daily.   VELPHORO 500 MG chewable tablet Chew 1,000 mg by mouth 3 (three) times daily.            Past Medical History:  Diagnosis Date   (HFpEF) heart failure with preserved ejection fraction (HCC)      a. 10/2018 Echo: EF 60-65%, diast dysfxn, RVSP 50.7 mmHg; b. 01/2020 Echo: EF 65-70%, G2DD; c. 01/2021 Echo: EF 60-65%, no rwma, mod LVH. Nl RV fxn, mildly enlarged RV. Mod BAE. Triv MR. d. 05/2021 Echo: EF 55-60; no RWMAs, mild LVH, mild TR.   Acquired hypothyroidism 02/09/2020   Adenoma of left adrenal gland     Anemia     Aortic atherosclerosis (HCC)     Arthritis     Breast cancer of upper-inner quadrant of right female breast (HCC)      Right breast invasive  CA and DCIS , 7 mm T1,N0,M0. Er/PR pos, her 2 negative.  Margins 1 mm.   COPD (chronic obstructive pulmonary disease) (HCC)     Diabetes mellitus type 2 in obese 02/09/2020   Difficult intubation 09/28/2021    a.) mallampati III-IV; BMI >55 kg/m   Diverticulosis     Dyslipidemia 02/09/2020   ESBL (extended spectrum beta-lactamase) producing bacteria infection 03/20/2021    a. urine culture (+) for ESBL producing E.coli and P. mirabilis   ESRD (end stage renal disease) on dialysis (HCC)      a. T-Th-Sat HD initiated 02/2021   Essential hypertension     Gout     History of kidney stones     Long term current use of anticoagulant      a.)  apixaban   Menopause      age 57   Morbid obesity (HCC)     OSA treated with BiPAP      a. on nocturnal PAP therapy; Trilogy 100 ventilator   Permanent atrial fibrillation (HCC)      a. Dx 10/2018 in setting of PNA. b. CHA2DS2-VASc = 7 (sex, HFpEF, HTN, PE x2, aortic plaque, T2DM); c. s/p successful DCCV 11/2018. d. subsequent recurrent Afib; rate/rhythm maintained on oral metoprolol succinate; chronically anticoagulated using standard dose apixaban.   Personal history of radiation therapy     Pulmonary embolism (HCC) 10/2014    a. on chronic apixaban   Restrictive lung disease     Shingles 10/24/2021   Sleep difficulties      a.) takes melatonin   Thyroid goiter                 Past Surgical History:  Procedure Laterality Date   A/V FISTULAGRAM Left 09/02/2021    Procedure: A/V Fistulagram;  Surgeon: Renford Dills, MD;  Location: [facility];  Service: Cardiovascular;  Laterality: Left;   A/V FISTULAGRAM Left 06/27/2022    Procedure: A/V Fistulagram;  Surgeon: Renford Dills, MD;  Location: [facility];  Service: Cardiovascular;  Laterality: Left;   AV FISTULA PLACEMENT Left 07/22/2021    Procedure: ARTERIOVENOUS (AV) FISTULA CREATION (RADIALCEPHALIC);  Surgeon: Renford Dills, MD;  Location: [facility];  Service: Vascular;  Laterality: Left;   AV FISTULA PLACEMENT Left 12/09/2021    Procedure: ARTERIOVENOUS (AV) FISTULA CREATION (BRACHIAL CEPHALIC);  Surgeon: Renford Dills, MD;  Location: [facility];  Service: Vascular;  Laterality: Left;   BREAST BIOPSY Right 2014    breast ca   BREAST EXCISIONAL BIOPSY Right 07/19/2012    breast ca   BREAST SURGERY Right 2014    with sentinel node bx subareaolar duct excision   CARDIOVERSION N/A 11/28/2018    Procedure: CARDIOVERSION;  Surgeon: Antonieta Iba, MD;  Location: [facility];  Service: Cardiovascular;  Laterality: N/A;   COLONOSCOPY WITH PROPOFOL N/A 01/30/2017    Procedure: COLONOSCOPY WITH PROPOFOL;   Surgeon: Kieth Brightly, MD;  Location: [facility];  Service: Endoscopy;  Laterality: N/A;   CYSTOSCOPY W/ RETROGRADES   06/17/2021    Procedure: CYSTOSCOPY WITH RETROGRADE PYELOGRAM;  Surgeon: Sondra Come, MD;  Location: [facility];  Service: Urology;;   CYSTOSCOPY W/ URETERAL STENT PLACEMENT Left 02/14/2021    Procedure: CYSTOSCOPY WITH RETROGRADE PYELOGRAM/URETERAL STENT PLACEMENT;  Surgeon: Sondra Come, MD;  Location: [facility];  Service: Urology;  Laterality: Left;   CYSTOSCOPY/URETEROSCOPY/HOLMIUM LASER/STENT PLACEMENT Left 06/17/2021    Procedure: CYSTOSCOPY/URETEROSCOPY/HOLMIUM LASER/STENT EXCHANGE;  Surgeon: Sondra Come,  MD;  Location: [facility];  Service: Urology;  Laterality: Left;   DIALYSIS/PERMA CATHETER INSERTION N/A 02/24/2021    Procedure: DIALYSIS/PERMA CATHETER INSERTION;  Surgeon: Annice Needy, MD;  Location: [facility];  Service: Cardiovascular;  Laterality: N/A;   DIALYSIS/PERMA CATHETER INSERTION N/A 12/16/2021    Procedure: DIALYSIS/PERMA CATHETER INSERTION;  Surgeon: Renford Dills, MD;  Location: [facility];  Service: Cardiovascular;  Laterality: N/A;   DIALYSIS/PERMA CATHETER REMOVAL N/A 09/04/2022    Procedure: DIALYSIS/PERMA CATHETER REMOVAL;  Surgeon: Annice Needy, MD;  Location: [facility];  Service: Cardiovascular;  Laterality: N/A;   PERIPHERAL VASCULAR THROMBECTOMY Left 09/28/2021    Procedure: PERIPHERAL VASCULAR THROMBECTOMY;  Surgeon: Renford Dills, MD;  Location: [facility];  Service: Cardiovascular;  Laterality: Left;   REVISON OF ARTERIOVENOUS FISTULA Left 04/28/2022    Procedure: REVISON OF ARTERIOVENOUS FISTULA (BRACHIAL CEPHALIC);  Surgeon: Renford Dills, MD;  Location: [facility];  Service: Vascular;  Laterality: Left;          Social History Social History  Social History         Tobacco Use   Smoking status: Never      Passive exposure: Never   Smokeless tobacco: Never  Vaping  Use   Vaping status: Never Used  Substance Use Topics   Alcohol use: No      Alcohol/week: 0.0 standard drinks of alcohol   Drug use: No        Family History      Family History  Problem Relation Age of Onset   Diabetes Father     Hypertension Father     Parkinson's disease Father     Hypertension Mother     Rheum arthritis Mother     Atrial fibrillation Mother     Heart failure Mother     Heart attack Mother          died in her mid-70's.   Colon cancer Cousin 54   Breast cancer Neg Hx            Allergies       Allergies  Allergen Reactions   Spironolactone        Hyperkalemia   Pt unaware of this allergy          REVIEW OF SYSTEMS (Negative unless checked)   Constitutional: [] Weight loss  [] Fever  [] Chills Cardiac: [] Chest pain   [] Chest pressure   [] Palpitations   [] Shortness of breath when laying flat   [] Shortness of breath with exertion. Vascular:  [] Pain in legs with walking   [] Pain in legs at rest  [] History of DVT   [] Phlebitis   [] Swelling in legs   [] Varicose veins   [] Non-healing ulcers Pulmonary:   [] Uses home oxygen   [] Productive cough   [] Hemoptysis   [] Wheeze  [] COPD   [] Asthma Neurologic:  [] Dizziness   [] Seizures   [] History of stroke   [] History of TIA  [] Aphasia   [] Vissual changes   [] Weakness or numbness in arm   [] Weakness or numbness in leg Musculoskeletal:   [] Joint swelling   [] Joint pain   [] Low back pain Hematologic:  [] Easy bruising  [] Easy bleeding   [] Hypercoagulable state   [] Anemic Gastrointestinal:  [] Diarrhea   [] Vomiting  [] Gastroesophageal reflux/heartburn   [] Difficulty swallowing. Genitourinary:  [x] Chronic kidney disease   [] Difficult urination  [] Frequent urination   [] Blood in urine Skin:  [] Rashes   [] Ulcers  Psychological:  [] History of anxiety   []   History of major depression.   Physical Examination      Vitals:    01/05/23 1242  BP: (!) 108/54  Pulse: 92  Resp: 20  Temp: 97.7 F (36.5 C)  TempSrc: Oral   SpO2: 96%  Weight: (!) 159 kg  Height: 5\' 1"  (1.549 m)    Body mass index is 66.23 kg/m. Gen: WD/WN, NAD Head: [place]/AT, No temporalis wasting.  Ear/Nose/Throat: Hearing grossly intact, nares w/o erythema or drainage Eyes: PER, EOMI, sclera nonicteric.  Neck: Supple, no gross masses or lesions.  No JVD.  Pulmonary:  Good air movement, no audible wheezing, no use of accessory muscles.  Cardiac: RRR, precordium non-hyperdynamic. Vascular:   Weak thrill weak bruit left arm fistula Vessel Right Left  Radial Palpable Palpable  Brachial Palpable Palpable  Gastrointestinal: soft, non-distended. No guarding/no peritoneal signs.  Musculoskeletal: M/S 5/5 throughout.  No deformity.  Neurologic: CN 2-12 intact. Pain and light touch intact in extremities.  Symmetrical.  Speech is fluent. Motor exam as listed above. Psychiatric: Judgment intact, Mood & affect appropriate for pt's clinical situation. Dermatologic: No rashes or ulcers noted.  No changes consistent with cellulitis.     CBC Recent Labs       Lab Results  Component Value Date    WBC 11.3 (H) 09/30/2022    HGB 12.4 09/30/2022    HCT 41.0 09/30/2022    MCV 115.8 (H) 09/30/2022    PLT 156 09/30/2022        BMET Labs (Brief)          Component Value Date/Time    NA 136 09/30/2022 1055    NA 144 02/27/2020 0931    NA 141 03/09/2014 1533    K 5.0 09/30/2022 1055    K 4.2 03/09/2014 1533    CL 99 09/30/2022 1055    CL 101 03/09/2014 1533    CO2 27 09/30/2022 1055    CO2 30 03/09/2014 1533    GLUCOSE 180 (H) 09/30/2022 1055    GLUCOSE 95 03/09/2014 1533    BUN 50 (H) 09/30/2022 1055    BUN 40 (H) 02/27/2020 0931    BUN 35 (H) 03/09/2014 1533    CREATININE 8.93 (H) 09/30/2022 1055    CREATININE 1.50 (H) 03/09/2014 1533    CALCIUM 8.2 (L) 09/30/2022 1055    CALCIUM 8.8 03/09/2014 1533    GFRNONAA 5 (L) 09/30/2022 1055    GFRNONAA 39 (L) 03/09/2014 1533    GFRNONAA 55 (L) 07/31/2013 1521    GFRAA 35 (L) 02/27/2020  0931    GFRAA 47 (L) 03/09/2014 1533    GFRAA >60 07/31/2013 1521      CrCl cannot be calculated (Patient's most recent lab result is older than the maximum 21 days allowed.).   COAG Recent Labs       Lab Results  Component Value Date    INR 1.1 02/15/2021    INR 1.35 01/12/2018    INR 1.27 11/16/2017        Radiology Imaging Results  No results found.       Assessment/Plan 1. ESRD on hemodialysis [facility]) Recommend:   The patient is experiencing increasing problems with their dialysis access.   Patient should have a fistulagram with the intention for intervention.  The intention for intervention is to restore appropriate flow and prevent thrombosis and possible loss of the access.  As well as improve the quality of dialysis therapy.   The risks, benefits and alternative therapies were reviewed in  detail with the patient.  All questions were answered.  The patient agrees to proceed with angio/intervention.     The patient will follow up with me in the office after the procedure.     2. Type 2 diabetes mellitus with obesity (HCC) Continue hypoglycemic medications as already ordered, these medications have been reviewed and there are no changes at this time.   Hgb A1C to be monitored as already arranged by primary service   3. Essential hypertension Continue antihypertensive medications as already ordered, these medications have been reviewed and there are no changes at this time.   4. Atrial fibrillation, chronic (HCC) Continue antiarrhythmia medications as already ordered, these medications have been reviewed and there are no changes at this time.   Continue anticoagulation as ordered by Cardiology Service   5. Dyslipidemia Continue statin as ordered and reviewed, no changes at this time     Levora Dredge, MD   01/05/2023 1:12 PM            Electronically signed by Renford Dills, MD at 01/05/2023  1:13 PM     Admission (Current) on 01/05/2023        Detailed Report      Note shared with patient

## 2023-01-05 NOTE — Transfer of Care (Signed)
Immediate Anesthesia Transfer of Care Note  Patient: Nicole Schmidt  Procedure(s) Performed: A/V Fistulagram (Left)  Patient Location: pacu   Anesthesia Type:General  Level of Consciousness: awake, alert , and oriented  Airway & Oxygen Therapy: Patient Spontanous Breathing and Patient connected to face mask oxygen  Post-op Assessment: Report given to RN and Post -op Vital signs reviewed and stable  Post vital signs: Reviewed  Last Vitals:  Vitals Value Taken Time  BP 110/56 01/05/23 1415  Temp    Pulse 85 01/05/23 1418  Resp 92   SpO2 100 % 01/05/23 1418  Vitals shown include unfiled device data.  Last Pain:  Vitals:   01/05/23 1242  TempSrc: Oral  PainSc: 0-No pain         Complications:  Encounter Notable Events  Notable Event Outcome Phase Comment  None  Intraprocedure

## 2023-01-05 NOTE — Interval H&P Note (Signed)
History and Physical Interval Note:  01/05/2023 1:15 PM  Nicole Schmidt  has presented today for surgery, with the diagnosis of L arm fistulagram  End Stage Renal.  The various methods of treatment have been discussed with the patient and family. After consideration of risks, benefits and other options for treatment, the patient has consented to  Procedure(s): A/V Fistulagram (Left) as a surgical intervention.  The patient's history has been reviewed, patient examined, no change in status, stable for surgery.  I have reviewed the patient's chart and labs.  Questions were answered to the patient's satisfaction.     Levora Dredge

## 2023-01-05 NOTE — Op Note (Signed)
OPERATIVE NOTE   PROCEDURE: Contrast injection left brachiocephalic AV access Percutaneous transluminal angioplasty peripheral segment in 3 locations left brachiocephalic fistula  PRE-OPERATIVE DIAGNOSIS: Complication of dialysis access                                                       End Stage Renal Disease  POST-OPERATIVE DIAGNOSIS: same as above   SURGEON: Renford Dills, M.D.  ANESTHESIA: General anesthesia.  ESTIMATED BLOOD LOSS: minimal  FINDING(S): Stricture of the AV graft  SPECIMEN(S):  None  CONTRAST: 25 cc  FLUOROSCOPY TIME: 1.5 minutes  INDICATIONS: Nicole Schmidt is a 57 y.o. female who  presents with malfunctioning left arm AV access.  The patient is scheduled for angiography with possible intervention of the AV access.  The patient is aware the risks include but are not limited to: bleeding, infection, thrombosis of the cannulated access, and possible anaphylactic reaction to the contrast.  The patient acknowledges if the access can not be salvaged a tunneled catheter will be needed and will be placed during this procedure.  The patient is aware of the risks of the procedure and elects to proceed with the angiogram and intervention.  DESCRIPTION: After full informed written consent was obtained, the patient was brought back to the Special Procedure suite and placed supine position.  Appropriate cardiopulmonary monitors were placed.  The left arm was prepped and draped in the standard fashion.  Appropriate timeout is called. The left brachiocephalic fistula was cannulated with a micropuncture needle near the antecubital fossa in an antegrade direction.  Cannulation was performed with ultrasound guidance. Ultrasound was placed in a sterile sleeve, the AV access was interrogated and noted to be echolucent and compressible indicating patency. Image was recorded for the permanent record. The puncture is performed under continuous ultrasound visualization.   The  microwire was advanced and the needle was exchanged for  a microsheath.  The J-wire was then advanced and a 6 Fr sheath inserted.  Hand injections were completed to image the access from the arterial anastomosis through the entire access.  The central venous structures were also imaged by hand injections.  Based on the images,  4000 units of heparin was given and a wire was negotiated through the strictures within the venous portion of the graft.  An 8 mm x 40 mm Dorado balloon was used to treat the most distal of the 3 lesions.  Inflation was to 10 atm for 1 minute.  Next, an 8 mm x 40 mm Lutonix drug-eluting balloon was used to treat the more proximal portion of the vein at the level of the deltoid.  Inflation was to 8 atm for 1 minute.  Lastly an 8 mm x 60 mm Lutonix drug-eluting balloon was used to treat the cephalic vein up at the level of the shoulder.  Again inflation was 8 atm for 1 minute.  Follow-up imaging demonstrates complete resolution of the strictures with less than 10% residual stenosis and with rapid flow of contrast through the graft, the central venous anatomy is preserved.  A 4-0 Monocryl purse-string suture was sewn around the sheath.  The sheath was removed and light pressure was applied.  A sterile bandage was applied to the puncture site.    COMPLICATIONS: None  CONDITION: Nicole Schmidt, M.D [facility] Office: [phone]  01/05/2023 2:12 PM

## 2023-01-08 ENCOUNTER — Encounter: Payer: Self-pay | Admitting: Vascular Surgery

## 2023-01-08 LAB — GLUCOSE, CAPILLARY: Glucose-Capillary: 155 mg/dL — ABNORMAL HIGH (ref 70–99)

## 2023-01-15 ENCOUNTER — Inpatient Hospital Stay: Payer: BC Managed Care – PPO | Attending: Oncology | Admitting: Oncology

## 2023-01-15 ENCOUNTER — Encounter: Payer: Self-pay | Admitting: Oncology

## 2023-01-15 VITALS — BP 97/55 | HR 97 | Temp 100.1°F | Resp 20

## 2023-01-15 DIAGNOSIS — Z7901 Long term (current) use of anticoagulants: Secondary | ICD-10-CM | POA: Insufficient documentation

## 2023-01-15 DIAGNOSIS — E118 Type 2 diabetes mellitus with unspecified complications: Secondary | ICD-10-CM | POA: Diagnosis not present

## 2023-01-15 DIAGNOSIS — Z08 Encounter for follow-up examination after completed treatment for malignant neoplasm: Secondary | ICD-10-CM

## 2023-01-15 DIAGNOSIS — Z86711 Personal history of pulmonary embolism: Secondary | ICD-10-CM | POA: Diagnosis not present

## 2023-01-15 DIAGNOSIS — Z853 Personal history of malignant neoplasm of breast: Secondary | ICD-10-CM | POA: Insufficient documentation

## 2023-01-15 DIAGNOSIS — N186 End stage renal disease: Secondary | ICD-10-CM | POA: Diagnosis not present

## 2023-01-15 DIAGNOSIS — E1122 Type 2 diabetes mellitus with diabetic chronic kidney disease: Secondary | ICD-10-CM | POA: Diagnosis not present

## 2023-01-15 DIAGNOSIS — N189 Chronic kidney disease, unspecified: Secondary | ICD-10-CM | POA: Diagnosis not present

## 2023-01-15 DIAGNOSIS — Z800 Family history of malignant neoplasm of digestive organs: Secondary | ICD-10-CM | POA: Insufficient documentation

## 2023-01-15 DIAGNOSIS — E039 Hypothyroidism, unspecified: Secondary | ICD-10-CM | POA: Diagnosis not present

## 2023-01-15 DIAGNOSIS — E221 Hyperprolactinemia: Secondary | ICD-10-CM | POA: Diagnosis not present

## 2023-01-15 NOTE — Progress Notes (Signed)
Hematology/Oncology Consult note [facility]  [phone] [phone]  Patient Care Team: Patient, No Pcp Per as PCP - General (General Practice) Iran Ouch, MD as PCP - Cardiology (Cardiology) Kieth Brightly, MD (General Surgery) Delma Freeze, FNP as Nurse Practitioner (Family Medicine) Mady Haagensen, MD as Consulting Physician (Nephrology) Creig Hines, MD as Consulting Physician (Hematology and Oncology)   Name of the patient: Nicole Schmidt  [id number]  09-20-1965   Date of visit: 01/15/23  Diagnosis- history of pulmonary embolism on Eliquis  History of breast cancer  Chief complaint/ Reason for visit-routine follow-up of history of PE on Eliquis  Heme/Onc history:  Patient is a 57 year old female with history of 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  She is s/p surgery adjuvant radiation treatment and was on tamoxifen until September 2016 when she developed submassive pulmonary embolism.  She was switched to Femara and she completed 5 years of hormone therapy in June 2020.  Patient has been Eliquis for her pulmonary embolism since then.  Hypercoagulable work-up was negative.   Interval history-patient is currently on dialysis 3 times a week after she was admitted for AKI on CKD in February 2024.  Tolerating Eliquis well without any significant side effects  ECOG PS- 2 Pain scale- 0   Review of systems- Review of Systems  Constitutional:  Negative for chills, fever, malaise/fatigue and weight loss.  HENT:  Negative for congestion, ear discharge and nosebleeds.   Eyes:  Negative for blurred vision.  Respiratory:  Negative for cough, hemoptysis, sputum production, shortness of breath and wheezing.   Cardiovascular:  Negative for chest pain, palpitations, orthopnea and claudication.  Gastrointestinal:  Negative for abdominal pain, blood in stool, constipation, diarrhea, heartburn,  melena, nausea and vomiting.  Genitourinary:  Negative for dysuria, flank pain, frequency, hematuria and urgency.  Musculoskeletal:  Negative for back pain, joint pain and myalgias.  Skin:  Negative for rash.  Neurological:  Negative for dizziness, tingling, focal weakness, seizures, weakness and headaches.  Endo/Heme/Allergies:  Does not bruise/bleed easily.  Psychiatric/Behavioral:  Negative for depression and suicidal ideas. The patient does not have insomnia.       Allergies  Allergen Reactions   Spironolactone     Hyperkalemia  Pt unaware of this allergy     Past Medical History:  Diagnosis Date   (HFpEF) heart failure with preserved ejection fraction (HCC)    a. 10/2018 Echo: EF 60-65%, diast dysfxn, RVSP 50.7 mmHg; b. 01/2020 Echo: EF 65-70%, G2DD; c. 01/2021 Echo: EF 60-65%, no rwma, mod LVH. Nl RV fxn, mildly enlarged RV. Mod BAE. Triv MR. d. 05/2021 Echo: EF 55-60; no RWMAs, mild LVH, mild TR.   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 Er/PR pos, her 2 negative.  Margins 1 mm.   COPD (chronic obstructive pulmonary disease) (HCC)    Diabetes mellitus type 2 in obese 02/09/2020   Difficult intubation 09/28/2021   a.) mallampati III-IV; BMI >55 kg/m   Diverticulosis    Dyslipidemia 02/09/2020   ESBL (extended spectrum beta-lactamase) producing bacteria infection 03/20/2021   a. urine culture (+) for ESBL producing E.coli and P. mirabilis   ESRD (end stage renal disease) on  dialysis [facility])    a. T-Th-Sat HD initiated 02/2021   Essential hypertension    Gout    History of kidney stones    Long term current use of anticoagulant    a.) apixaban   Menopause    age 42   Morbid obesity (HCC)    OSA treated with BiPAP    a. on nocturnal PAP therapy; Trilogy 100  ventilator   Permanent atrial fibrillation (HCC)    a. Dx 10/2018 in setting of PNA. b. CHA2DS2-VASc = 7 (sex, HFpEF, HTN, PE x2, aortic plaque, T2DM); c. s/p successful DCCV 11/2018. d. subsequent recurrent Afib; rate/rhythm maintained on oral metoprolol succinate; chronically anticoagulated using standard dose apixaban.   Personal history of radiation therapy    Pulmonary embolism (HCC) 10/2014   a. on chronic apixaban   Restrictive lung disease    Shingles 10/24/2021   Sleep difficulties    a.) takes melatonin   Thyroid goiter      Past Surgical History:  Procedure Laterality Date   A/V FISTULAGRAM Left 09/02/2021   Procedure: A/V Fistulagram;  Surgeon: Renford Dills, MD;  Location: [facility];  Service: Cardiovascular;  Laterality: Left;   A/V FISTULAGRAM Left 06/27/2022   Procedure: A/V Fistulagram;  Surgeon: Renford Dills, MD;  Location: [facility];  Service: Cardiovascular;  Laterality: Left;   A/V FISTULAGRAM Left 01/05/2023   Procedure: A/V Fistulagram;  Surgeon: Renford Dills, MD;  Location: [facility];  Service: Cardiovascular;  Laterality: Left;   AV FISTULA PLACEMENT Left 07/22/2021   Procedure: ARTERIOVENOUS (AV) FISTULA CREATION (RADIALCEPHALIC);  Surgeon: Renford Dills, MD;  Location: [facility] ORS;  Service: Vascular;  Laterality: Left;   AV FISTULA PLACEMENT Left 12/09/2021   Procedure: ARTERIOVENOUS (AV) FISTULA CREATION (BRACHIAL CEPHALIC);  Surgeon: Renford Dills, MD;  Location: [facility] OR[facility];  Service: Vascular;  Laterality: Left;   BREAST BIOPSY Right 2014   breast ca   BREAST EXCISIONAL BIOPSY Right 07/19/2012   breast ca   BREAST SURGERY Right 2014   with sentinel node bx subareaolar duct excision   CARDIOVERSION N/A 11/28/2018   Procedure: CARDIOVERSION;  Surgeon: Antonieta Iba, MD;  Location: [facility];  Service: Cardiovascular;  Laterality: N/A;   COLONOSCOPY WITH PROPOFOL N/A 01/30/2017   Procedure: COLONOSCOPY  WITH PROPOFOL;  Surgeon: Kieth Brightly, MD;  Location: [facility];  Service: Endoscopy;  Laterality: N/A;   CYSTOSCOPY W/ RETROGRADES  06/17/2021   Procedure: CYSTOSCOPY WITH RETROGRADE PYELOGRAM;  Surgeon: Sondra Come, MD;  Location: [facility];  Service: Urology;;   CYSTOSCOPY W/ URETERAL STENT PLACEMENT Left 02/14/2021   Procedure: CYSTOSCOPY WITH RETROGRADE PYELOGRAM/URETERAL STENT PLACEMENT;  Surgeon: Sondra Come, MD;  Location: [facility];  Service: Urology;  Laterality: Left;   CYSTOSCOPY/URETEROSCOPY/HOLMIUM LASER/STENT PLACEMENT Left 06/17/2021   Procedure: CYSTOSCOPY/URETEROSCOPY/HOLMIUM LASER/STENT EXCHANGE;  Surgeon: Sondra Come, MD;  Location: [facility];  Service: Urology;  Laterality: Left;   DIALYSIS/PERMA CATHETER INSERTION N/A 02/24/2021   Procedure: DIALYSIS/PERMA CATHETER INSERTION;  Surgeon: Annice Needy, MD;  Location: [facility];  Service: Cardiovascular;  Laterality: N/A;   DIALYSIS/PERMA CATHETER INSERTION N/A 12/16/2021   Procedure: DIALYSIS/PERMA CATHETER INSERTION;  Surgeon: Renford Dills, MD;  Location: [facility];  Service: Cardiovascular;  Laterality: N/A;   DIALYSIS/PERMA CATHETER REMOVAL N/A 09/04/2022   Procedure: DIALYSIS/PERMA CATHETER REMOVAL;  Surgeon: Annice Needy, MD;  Location: [facility];  Service: Cardiovascular;  Laterality: N/A;  PERIPHERAL VASCULAR THROMBECTOMY Left 09/28/2021   Procedure: PERIPHERAL VASCULAR THROMBECTOMY;  Surgeon: Renford Dills, MD;  Location: [facility];  Service: Cardiovascular;  Laterality: Left;   REVISON OF ARTERIOVENOUS FISTULA Left 04/28/2022   Procedure: REVISON OF ARTERIOVENOUS FISTULA (BRACHIAL CEPHALIC);  Surgeon: Renford Dills, MD;  Location: [facility];  Service: Vascular;  Laterality: Left;    Social History   Socioeconomic History   Marital status: Single    Spouse name: Not on file   Number of children: Not on file   Years of education: Not on file    Highest education level: Not on file  Occupational History   Occupation: retired  Tobacco Use   Smoking status: Never    Passive exposure: Never   Smokeless tobacco: Never  Vaping Use   Vaping status: Never Used  Substance and Sexual Activity   Alcohol use: No    Alcohol/week: 0.0 standard drinks of alcohol   Drug use: No   Sexual activity: Not on file  Other Topics Concern   Not on file  Social History Narrative   Lives in [place] by herself and retired.    Social Determinants of Health   Financial Resource Strain: Low Risk  (12/22/2022)   Received from [facility]   Overall Financial Resource Strain (CARDIA)    Difficulty of Paying Living Expenses: Not hard at all  Food Insecurity: No Food Insecurity (12/22/2022)   Received from [facility]   Hunger Vital Sign    Worried About Running Out of Food in the Last Year: Never true    Ran Out of Food in the Last Year: Never true  Transportation Needs: No Transportation Needs (12/22/2022)   Received from [facility] - Transportation    In the past 12 months, has lack of transportation kept you from medical appointments or from getting medications?: No    Lack of Transportation (Non-Medical): No  Physical Activity: Unknown (11/15/2017)   Exercise Vital Sign    Days of Exercise per Week: Patient declined    Minutes of Exercise per Session: Patient declined  Stress: Unknown (11/15/2017)   [employer] of Occupational [employer] - Occupational Stress Questionnaire    Feeling of Stress : Patient declined  Social Connections: Unknown (11/15/2017)   Social Connection and Isolation Panel [NHANES]    Frequency of Communication with Friends and Family: Patient declined    Frequency of Social Gatherings with Friends and Family: Patient declined    Attends Religious Services: Patient declined    Database administrator or Organizations: Patient declined    Attends Tax inspector Meetings: Patient declined    Marital Status: Patient declined  Intimate Partner Violence: Unknown (11/15/2017)   Humiliation, Afraid, Rape, and Kick questionnaire    Fear of Current or Ex-Partner: Patient declined    Emotionally Abused: Patient declined    Physically Abused: Patient declined    Sexually Abused: Patient declined    Family History  Problem Relation Age of Onset   Diabetes Father    Hypertension Father    Parkinson's disease Father    Hypertension Mother    Rheum arthritis Mother    Atrial fibrillation Mother    Heart failure Mother    Heart attack Mother        died in her mid-70's.   Colon cancer Cousin 54   Breast cancer Neg Hx      Current Outpatient Medications:  acetaminophen (TYLENOL) 325 MG tablet, Take 2 tablets (650 mg total) by mouth every 6 (six) hours as needed for mild pain (or Fever >/= 101). (Patient not taking: Reported on 08/23/2022), Disp: , Rfl:    albuterol (VENTOLIN HFA) 108 (90 Base) MCG/ACT inhaler, Inhale 2 puffs into the lungs every 6 (six) hours as needed for wheezing or shortness of breath. (Patient not taking: Reported on 01/05/2023), Disp: 8.5 g, Rfl: 0   apixaban (ELIQUIS) 5 MG TABS tablet, Take 1 tablet (5 mg total) by mouth 2 (two) times daily., Disp: 180 tablet, Rfl: 1   aspirin EC 81 MG tablet, Take 1 tablet (81 mg total) by mouth daily. Swallow whole., Disp: 150 tablet, Rfl: 1   atorvastatin (LIPITOR) 40 MG tablet, Take 40 mg by mouth daily., Disp: , Rfl:    calcium acetate (PHOSLO) 667 MG capsule, Take 667 mg by mouth 3 (three) times daily., Disp: , Rfl:    Cholecalciferol (VITAMIN D3) 1.25 MG (50000 UT) CAPS, Take 1 capsule by mouth once a week. (Patient not taking: Reported on 11/03/2022), Disp: , Rfl:    cinacalcet (SENSIPAR) 30 MG tablet, Take 30 mg by mouth daily., Disp: , Rfl:    ciprofloxacin (CIPRO) 250 MG tablet, Take 1 tablet (250 mg total) by mouth 2 (two) times daily. (Patient not taking: Reported on  01/05/2023), Disp: 14 tablet, Rfl: 0   cloNIDine (CATAPRES) 0.1 MG tablet, Take 1 tablet (0.1 mg total) by mouth 2 (two) times daily., Disp: 180 tablet, Rfl: 2   CRANBERRY PO, Take 1 capsule by mouth daily., Disp: , Rfl:    ergocalciferol (VITAMIN D2) 1.25 MG (50000 UT) capsule, Take 1 capsule (50,000 Units total) by mouth once a week. (Patient not taking: Reported on 10/13/2022), Disp: 5 capsule, Rfl: 0   ezetimibe (ZETIA) 10 MG tablet, Take 10 mg by mouth daily with supper., Disp: , Rfl:    ferric gluconate 250 mg in sodium chloride 0.9 % 250 mL, Inject 250 mg into the vein Every Tuesday,Thursday,and Saturday with dialysis., Disp: , Rfl:    fluticasone-salmeterol (ADVAIR DISKUS) 100-50 MCG/ACT AEPB, Inhale 1 puff into the lungs 2 (two) times daily., Disp: 60 each, Rfl: 6   folic acid (FOLVITE) 1 MG tablet, Take 1 tablet (1 mg total) by mouth daily., Disp: 30 tablet, Rfl: 0   gabapentin (NEURONTIN) 300 MG capsule, Take 1 capsule (300 mg total) by mouth at bedtime., Disp: 30 capsule, Rfl: 0   HYDROcodone-acetaminophen (NORCO) 5-325 MG tablet, Take 1 tablet by mouth every 6 (six) hours as needed for moderate pain., Disp: 30 tablet, Rfl: 0   Lancets (ONETOUCH DELICA PLUS LANCET30G) MISC, , Disp: , Rfl:    levothyroxine (SYNTHROID) 112 MCG tablet, Take 1 tablet (112 mcg total) by mouth daily before breakfast., Disp: 30 tablet, Rfl: 0   melatonin 5 MG TABS, Take 2 tablets (10 mg total) by mouth at bedtime as needed (sleep)., Disp: 60 tablet, Rfl: 0   Melatonin-Pyridoxine 5-1 MG TABS, Take by mouth., Disp: , Rfl:    metoprolol succinate (TOPROL-XL) 25 MG 24 hr tablet, Take 1 tablet (25 mg total) by mouth at bedtime. Take with or immediately following a meal., Disp: 90 tablet, Rfl: 3   midodrine (PROAMATINE) 10 MG tablet, Take 1 tablet (10 mg total) by mouth Every Tuesday,Thursday,and Saturday with dialysis., Disp: 30 tablet, Rfl: 0   ONETOUCH ULTRA test strip, , Disp: , Rfl:    polyethylene glycol powder  (GLYCOLAX/MIRALAX) 17 GM/SCOOP powder, Take 1  Container by mouth once., Disp: , Rfl:    SSD 1 % cream, Apply topically 2 (two) times daily., Disp: , Rfl:    traMADol (ULTRAM) 50 MG tablet, Take 1 tablet (50 mg total) by mouth every 12 (twelve) hours as needed for moderate pain., Disp: 30 tablet, Rfl: 0   TRULICITY 1.5 MG/0.5ML SOPN, Inject 1.5 mg into the skin every Monday., Disp: , Rfl:    VELPHORO 500 MG chewable tablet, Chew 1,000 mg by mouth 3 (three) times daily., Disp: , Rfl:   Physical exam:  Vitals:   01/15/23 1148  BP: (!) 97/55  Pulse: 97  Resp: 20  Temp: 100.1 F (37.8 C)  TempSrc: Tympanic  SpO2: 95%   Physical Exam Cardiovascular:     Rate and Rhythm: Normal rate and regular rhythm.     Heart sounds: Normal heart sounds.  Pulmonary:     Effort: Pulmonary effort is normal.     Breath sounds: Normal breath sounds.  Skin:    General: Skin is warm and dry.  Neurological:     Mental Status: She is alert and oriented to person, place, and time.    Breast exam was performed in seated and lying down position. Patient is status post right lumpectomy with a well-healed surgical scar. No evidence of any palpable masses. No evidence of axillary adenopathy. No evidence of any palpable masses or lumps in the left breast. No evidence of leftt axillary adenopathy      Latest Ref Rng & Units 09/30/2022   10:55 AM  CMP  Glucose 70 - 99 mg/dL 213   BUN 6 - 20 mg/dL 50   Creatinine 0.86 - 1.00 mg/dL 5.78   Sodium 469 - 629 mmol/L 136   Potassium 3.5 - 5.1 mmol/L 5.0   Chloride 98 - 111 mmol/L 99   CO2 22 - 32 mmol/L 27   Calcium 8.9 - 10.3 mg/dL 8.2       Latest Ref Rng & Units 09/30/2022   10:55 AM  CBC  WBC 4.0 - 10.5 K/uL 11.3   Hemoglobin 12.0 - 15.0 g/dL 52.8   Hematocrit 41.3 - 46.0 % 41.0   Platelets 150 - 400 K/uL 156     No images are attached to the encounter.  PERIPHERAL VASCULAR CATHETERIZATION  Result Date: 01/05/2023 See surgical note for result.     Assessment and plan- Patient is a 57 y.o. female who is here for follow-up of following issues:  History of pulmonary embolism: This was back in 2016 when she developed pulmonary embolism on tamoxifen.  She has not had any recurrent episodes of DVT or PE since then.  Hypercoagulable workup negative.  However given her obesity as well as underlying comorbidities and CKD we have continued her anticoagulation.  Although there is no prospective data with Eliquis in patients with CKD there is retrospective data even in dialysis patients on Eliquis and therefore I plan to continue Eliquis 5 mg twice daily at this time  2.  History of breast cancer s/p surgery and adjuvant endocrine therapy ending in June 2020.  Clinically patient is doing well with no concerning signs and symptoms of recurrence based on today's exam.  She will need a mammogram in October 2025 which we will schedule.  I will see her back in 1 year no labs   Visit Diagnosis 1. Encounter for follow-up surveillance of breast cancer   2. Current use of long term anticoagulation      Dr. Ovidio Kin  Smith Robert, MD, MPH [facility] [phone] 01/15/2023 1:12 PM

## 2023-01-19 DIAGNOSIS — E662 Morbid (severe) obesity with alveolar hypoventilation: Secondary | ICD-10-CM | POA: Diagnosis not present

## 2023-01-23 DIAGNOSIS — I482 Chronic atrial fibrillation, unspecified: Secondary | ICD-10-CM | POA: Diagnosis not present

## 2023-01-23 DIAGNOSIS — I5032 Chronic diastolic (congestive) heart failure: Secondary | ICD-10-CM | POA: Diagnosis not present

## 2023-01-23 DIAGNOSIS — G4733 Obstructive sleep apnea (adult) (pediatric): Secondary | ICD-10-CM | POA: Diagnosis not present

## 2023-01-23 DIAGNOSIS — J9601 Acute respiratory failure with hypoxia: Secondary | ICD-10-CM | POA: Diagnosis not present

## 2023-01-28 ENCOUNTER — Emergency Department: Payer: BC Managed Care – PPO

## 2023-01-28 ENCOUNTER — Other Ambulatory Visit: Payer: Self-pay

## 2023-01-28 ENCOUNTER — Emergency Department
Admission: EM | Admit: 2023-01-28 | Discharge: 2023-01-28 | Disposition: A | Discharge status: Home / Self Care | Payer: BC Managed Care – PPO | Attending: Emergency Medicine | Admitting: Emergency Medicine

## 2023-01-28 DIAGNOSIS — I700 Atherosclerosis of aorta: Secondary | ICD-10-CM | POA: Diagnosis not present

## 2023-01-28 DIAGNOSIS — E1122 Type 2 diabetes mellitus with diabetic chronic kidney disease: Secondary | ICD-10-CM | POA: Diagnosis not present

## 2023-01-28 DIAGNOSIS — R0981 Nasal congestion: Secondary | ICD-10-CM | POA: Insufficient documentation

## 2023-01-28 DIAGNOSIS — N186 End stage renal disease: Secondary | ICD-10-CM | POA: Insufficient documentation

## 2023-01-28 DIAGNOSIS — Z1152 Encounter for screening for COVID-19: Secondary | ICD-10-CM | POA: Insufficient documentation

## 2023-01-28 DIAGNOSIS — R0982 Postnasal drip: Secondary | ICD-10-CM | POA: Diagnosis not present

## 2023-01-28 DIAGNOSIS — J449 Chronic obstructive pulmonary disease, unspecified: Secondary | ICD-10-CM | POA: Diagnosis not present

## 2023-01-28 DIAGNOSIS — R058 Other specified cough: Secondary | ICD-10-CM | POA: Diagnosis not present

## 2023-01-28 LAB — RESP PANEL BY RT-PCR (RSV, FLU A&B, COVID)  RVPGX2
Influenza A by PCR: NEGATIVE
Influenza B by PCR: NEGATIVE
Resp Syncytial Virus by PCR: NEGATIVE
SARS Coronavirus 2 by RT PCR: NEGATIVE

## 2023-01-28 NOTE — ED Triage Notes (Signed)
Pt reports some nasal congestion and cough for the past week. Pt reports she feels like she has congestion in her chest but she cannot get it up.

## 2023-01-28 NOTE — Discharge Instructions (Addendum)
Your evaluated in the ED for nasal congestion.  Your respiratory panel which includes COVID, influenza and RSV is negative.  Your chest x-ray is normal.  Please follow-up with your primary care physician.  Pick up guaifenesin from over-the-counter to help with nasal congestion symptoms.

## 2023-01-28 NOTE — ED Provider Notes (Signed)
[facility] Emergency Department Provider Note     Event Date/Time   First MD Initiated Contact with Patient 01/28/23 1836     (approximate)   History   Nasal Congestion and Cough   HPI  Nicole Schmidt is a 57 y.o. female with a history of COPD, morbid obesity, diabetes and ESRD presents to the ED with complaint of nasal congestion for 1 week.  Patient reports she is susceptible to pneumonia and when it evaluation.  Denies fever, cough, chest pain and vomiting.  Denies shortness of breath, sore throat and headache.     Physical Exam   Triage Vital Signs: ED Triage Vitals  Encounter Vitals Group     BP 01/28/23 1739 (!) 120/47     Systolic BP Percentile --      Diastolic BP Percentile --      Pulse Rate 01/28/23 1739 (!) 112     Resp 01/28/23 1739 20     Temp 01/28/23 1738 98.6 F (37 C)     Temp Source 01/28/23 1738 Oral     SpO2 01/28/23 1739 94 %     Weight 01/28/23 1737 (!) 352 lb 11.8 oz (160 kg)     Height 01/28/23 1737 5\' 1"  (1.549 m)     Head Circumference --      Peak Flow --      Pain Score 01/28/23 1737 0     Pain Loc --      Pain Education --      Exclude from Growth Chart --     Most recent vital signs: Vitals:   01/28/23 1739 01/28/23 1913  BP: (!) 120/47   Pulse: (!) 112 98  Resp: 20   Temp: 98.6 F (37 C)   SpO2: 94% 98%    General Awake, no distress.  HEENT NCAT. PERRL. EOMI. No rhinorrhea. Mucous membranes are moist.  CV:  Good peripheral perfusion. RRR RESP:  Normal effort. LCTAB ABD:  No distention.   ED Results / Procedures / Treatments   Labs (all labs ordered are listed, but only abnormal results are displayed) Labs Reviewed  RESP PANEL BY RT-PCR (RSV, FLU A&B, COVID)  [id number]   RADIOLOGY  I personally viewed and evaluated these images as part of my medical decision making, as well as reviewing the written report by the radiologist.  ED Provider Interpretation: Chest x-ray appears normal.  No  consolidation.  DG Chest 2 View Result Date: 01/28/2023 CLINICAL DATA:  Productive cough for 1 week, worsening today EXAM: CHEST - 2 VIEW COMPARISON:  09/30/2022 FINDINGS: Stable cardiomediastinal silhouette. Aortic atherosclerotic calcification. Linear opacities in the left lower lung favor atelectasis or scarring. Otherwise no focal consolidation, pleural effusion, or pneumothorax. No displaced rib fractures. IMPRESSION: No acute cardiopulmonary disease. Electronically Signed   By: Minerva Fester M.D.   On: 01/28/2023 18:17    PROCEDURES:  Critical Care performed: No  Procedures   MEDICATIONS ORDERED IN ED: Medications - No data to display   IMPRESSION / MDM / ASSESSMENT AND PLAN / ED COURSE  I reviewed the triage vital signs and the nursing notes.                               57 y.o. female presents to the emergency department for evaluation and treatment of nasal congestion. See HPI for further details.   Differential diagnosis includes, but is not limited to  viral URI, PNA  Patient's presentation is most consistent with acute complicated illness / injury requiring diagnostic workup.  Patient is alert and oriented.  She is hemodynamically stable and afebrile.  Physical exam findings are stated above.  Overall benign.  Respiratory panel reassuring.  X-ray is reassuring.  No signs of pneumonia.  I suspect this to be a upper respiratory infection.  Symptomatic care education provided.  Patient will be discharged home.  She is encouraged to follow-up with her primary care if symptoms do not improve in 3 days.   FINAL CLINICAL IMPRESSION(S) / ED DIAGNOSES   Final diagnoses:  Nasal congestion     Rx / DC Orders   ED Discharge Orders     None        Note:  This document was prepared using Dragon voice recognition software and may include unintentional dictation errors.    Romeo Apple, Ceriah Kohler A, PA-C 01/28/23 1926    Concha Se, MD 01/28/23 2228

## 2023-02-09 ENCOUNTER — Ambulatory Visit: Payer: BC Managed Care – PPO | Admitting: Cardiovascular Disease

## 2023-02-09 NOTE — Progress Notes (Deleted)
Cardiology Office Note   Date:  02/09/2023   ID:  Nicole Schmidt, DOB 1965-05-27, MRN [id number]  PCP:  Mick Sell, MD  Cardiologist:   Lorine Bears, MD   No chief complaint on file.     History of Present Illness: Nicole Schmidt is a 57 y.o. female who presents for for follow-up visit regarding permanent atrial fibrillation and chronic diastolic heart failure. She has known history of breast cancer status post radiation, diabetes mellitus, essential hypertension, end-stage renal disease on hemodialysis, thyroid disease, morbid obesity, sleep apnea on CPAP, anemia of chronic disease, gout, pulmonary embolism and chronic diastolic heart failure. She is now on dialysis on TTS schedule.  She reports intermittent hypotension especially on dialysis days.  No chest pain or palpitations.  She has chronic exertional dyspnea that has not changed.  No issues with anticoagulation.  She takes Eliquis 5 mg twice daily.  Past Medical History:  Diagnosis Date   (HFpEF) heart failure with preserved ejection fraction (HCC)    a. 10/2018 Echo: EF 60-65%, diast dysfxn, RVSP 50.7 mmHg; b. 01/2020 Echo: EF 65-70%, G2DD; c. 01/2021 Echo: EF 60-65%, no rwma, mod LVH. Nl RV fxn, mildly enlarged RV. Mod BAE. Triv MR. d. 05/2021 Echo: EF 55-60; no RWMAs, mild LVH, mild TR.   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 Er/PR pos, her 2 negative.  Margins 1 mm.   COPD (chronic obstructive pulmonary disease) (HCC)    Diabetes mellitus type 2 in obese 02/09/2020   Difficult intubation 09/28/2021   a.) mallampati III-IV; BMI >55 kg/m   Diverticulosis    Dyslipidemia 02/09/2020   ESBL (extended spectrum beta-lactamase) producing bacteria infection 03/20/2021   a. urine culture (+) for ESBL producing  E.coli and P. mirabilis   ESRD (end stage renal disease) on dialysis (HCC)    a. T-Th-Sat HD initiated 02/2021   Essential hypertension    Gout    History of kidney stones    Long term current use of anticoagulant    a.) apixaban   Menopause    age 8   Morbid obesity (HCC)    OSA treated with BiPAP    a. on nocturnal PAP therapy; Trilogy 100 ventilator   Permanent atrial fibrillation (HCC)    a. Dx 10/2018 in setting of PNA. b. CHA2DS2-VASc = 7 (sex, HFpEF, HTN, PE x2, aortic plaque, T2DM); c. s/p successful DCCV 11/2018. d. subsequent recurrent Afib; rate/rhythm maintained on oral metoprolol succinate; chronically anticoagulated using standard dose apixaban.   Personal history of radiation therapy    Pulmonary embolism (HCC) 10/2014   a. on chronic apixaban   Restrictive lung disease    Shingles 10/24/2021   Sleep difficulties    a.) takes melatonin   Thyroid goiter     Past Surgical History:  Procedure Laterality Date   A/V FISTULAGRAM Left 09/02/2021   Procedure: A/V Fistulagram;  Surgeon: Renford Dills, MD;  Location: [facility];  Service: Cardiovascular;  Laterality: Left;   A/V FISTULAGRAM Left 06/27/2022   Procedure: A/V Fistulagram;  Surgeon: Renford Dills, MD;  Location: [facility];  Service: Cardiovascular;  Laterality: Left;   A/V FISTULAGRAM Left 01/05/2023   Procedure: A/V Fistulagram;  Surgeon: Renford Dills, MD;  Location: [facility];  Service: Cardiovascular;  Laterality: Left;   AV FISTULA PLACEMENT Left 07/22/2021   Procedure: ARTERIOVENOUS (AV) FISTULA CREATION (RADIALCEPHALIC);  Surgeon: Renford Dills, MD;  Location: [facility];  Service: Vascular;  Laterality: Left;   AV FISTULA PLACEMENT Left 12/09/2021   Procedure: ARTERIOVENOUS (AV) FISTULA CREATION (BRACHIAL CEPHALIC);  Surgeon: Renford Dills, MD;  Location: [facility];  Service: Vascular;  Laterality: Left;   BREAST BIOPSY Right 2014   breast ca   BREAST  EXCISIONAL BIOPSY Right 07/19/2012   breast ca   BREAST SURGERY Right 2014   with sentinel node bx subareaolar duct excision   CARDIOVERSION N/A 11/28/2018   Procedure: CARDIOVERSION;  Surgeon: Antonieta Iba, MD;  Location: [facility];  Service: Cardiovascular;  Laterality: N/A;   COLONOSCOPY WITH PROPOFOL N/A 01/30/2017   Procedure: COLONOSCOPY WITH PROPOFOL;  Surgeon: Kieth Brightly, MD;  Location: [facility];  Service: Endoscopy;  Laterality: N/A;   CYSTOSCOPY W/ RETROGRADES  06/17/2021   Procedure: CYSTOSCOPY WITH RETROGRADE PYELOGRAM;  Surgeon: Sondra Come, MD;  Location: [facility];  Service: Urology;;   CYSTOSCOPY W/ URETERAL STENT PLACEMENT Left 02/14/2021   Procedure: CYSTOSCOPY WITH RETROGRADE PYELOGRAM/URETERAL STENT PLACEMENT;  Surgeon: Sondra Come, MD;  Location: [facility];  Service: Urology;  Laterality: Left;   CYSTOSCOPY/URETEROSCOPY/HOLMIUM LASER/STENT PLACEMENT Left 06/17/2021   Procedure: CYSTOSCOPY/URETEROSCOPY/HOLMIUM LASER/STENT EXCHANGE;  Surgeon: Sondra Come, MD;  Location: [facility];  Service: Urology;  Laterality: Left;   DIALYSIS/PERMA CATHETER INSERTION N/A 02/24/2021   Procedure: DIALYSIS/PERMA CATHETER INSERTION;  Surgeon: Annice Needy, MD;  Location: [facility];  Service: Cardiovascular;  Laterality: N/A;   DIALYSIS/PERMA CATHETER INSERTION N/A 12/16/2021   Procedure: DIALYSIS/PERMA CATHETER INSERTION;  Surgeon: Renford Dills, MD;  Location: [facility];  Service: Cardiovascular;  Laterality: N/A;   DIALYSIS/PERMA CATHETER REMOVAL N/A 09/04/2022   Procedure: DIALYSIS/PERMA CATHETER REMOVAL;  Surgeon: Annice Needy, MD;  Location: [facility];  Service: Cardiovascular;  Laterality: N/A;   PERIPHERAL VASCULAR THROMBECTOMY Left 09/28/2021   Procedure: PERIPHERAL VASCULAR THROMBECTOMY;  Surgeon: Renford Dills, MD;  Location: [facility];  Service: Cardiovascular;  Laterality: Left;   REVISON OF ARTERIOVENOUS  FISTULA Left 04/28/2022   Procedure: REVISON OF ARTERIOVENOUS FISTULA (BRACHIAL CEPHALIC);  Surgeon: Renford Dills, MD;  Location: [facility];  Service: Vascular;  Laterality: Left;     Current Outpatient Medications  Medication Sig Dispense Refill   acetaminophen (TYLENOL) 325 MG tablet Take 2 tablets (650 mg total) by mouth every 6 (six) hours as needed for mild pain (or Fever >/= 101). (Patient not taking: Reported on 08/23/2022)     albuterol (VENTOLIN HFA) 108 (90 Base) MCG/ACT inhaler Inhale 2 puffs into the lungs every 6 (six) hours as needed for wheezing or shortness of breath. (Patient not taking: Reported on 01/05/2023) 8.5 g 0   apixaban (ELIQUIS) 5 MG TABS tablet Take 1 tablet (5 mg total) by mouth 2 (two) times daily. 180 tablet 1   aspirin EC 81 MG tablet Take 1 tablet (81 mg total) by mouth daily. Swallow whole. 150 tablet 1   atorvastatin (LIPITOR) 40 MG tablet Take 40 mg by mouth daily.     calcium acetate (PHOSLO) 667 MG capsule Take 667 mg by mouth 3 (three) times daily.     Cholecalciferol (VITAMIN D3) 1.25 MG (50000 UT) CAPS Take 1 capsule by mouth once a week. (  Patient not taking: Reported on 11/03/2022)     cinacalcet (SENSIPAR) 30 MG tablet Take 30 mg by mouth daily.     ciprofloxacin (CIPRO) 250 MG tablet Take 1 tablet (250 mg total) by mouth 2 (two) times daily. (Patient not taking: Reported on 01/05/2023) 14 tablet 0   cloNIDine (CATAPRES) 0.1 MG tablet Take 1 tablet (0.1 mg total) by mouth 2 (two) times daily. 180 tablet 2   CRANBERRY PO Take 1 capsule by mouth daily.     ergocalciferol (VITAMIN D2) 1.25 MG (50000 UT) capsule Take 1 capsule (50,000 Units total) by mouth once a week. (Patient not taking: Reported on 10/13/2022) 5 capsule 0   ezetimibe (ZETIA) 10 MG tablet Take 10 mg by mouth daily with supper.     ferric gluconate 250 mg in sodium chloride 0.9 % 250 mL Inject 250 mg into the vein Every Tuesday,Thursday,and Saturday with dialysis.      fluticasone-salmeterol (ADVAIR DISKUS) 100-50 MCG/ACT AEPB Inhale 1 puff into the lungs 2 (two) times daily. 60 each 6   folic acid (FOLVITE) 1 MG tablet Take 1 tablet (1 mg total) by mouth daily. 30 tablet 0   gabapentin (NEURONTIN) 300 MG capsule Take 1 capsule (300 mg total) by mouth at bedtime. 30 capsule 0   HYDROcodone-acetaminophen (NORCO) 5-325 MG tablet Take 1 tablet by mouth every 6 (six) hours as needed for moderate pain. 30 tablet 0   Lancets (ONETOUCH DELICA PLUS LANCET30G) MISC      levothyroxine (SYNTHROID) 112 MCG tablet Take 1 tablet (112 mcg total) by mouth daily before breakfast. 30 tablet 0   melatonin 5 MG TABS Take 2 tablets (10 mg total) by mouth at bedtime as needed (sleep). 60 tablet 0   Melatonin-Pyridoxine 5-1 MG TABS Take by mouth.     metoprolol succinate (TOPROL-XL) 25 MG 24 hr tablet Take 1 tablet (25 mg total) by mouth at bedtime. Take with or immediately following a meal. 90 tablet 3   midodrine (PROAMATINE) 10 MG tablet Take 1 tablet (10 mg total) by mouth Every Tuesday,Thursday,and Saturday with dialysis. 30 tablet 0   ONETOUCH ULTRA test strip      polyethylene glycol powder (GLYCOLAX/MIRALAX) 17 GM/SCOOP powder Take 1 Container by mouth once.     SSD 1 % cream Apply topically 2 (two) times daily.     traMADol (ULTRAM) 50 MG tablet Take 1 tablet (50 mg total) by mouth every 12 (twelve) hours as needed for moderate pain. 30 tablet 0   TRULICITY 1.5 MG/0.5ML SOPN Inject 1.5 mg into the skin every Monday.     VELPHORO 500 MG chewable tablet Chew 1,000 mg by mouth 3 (three) times daily.     No current facility-administered medications for this visit.    Allergies:   Spironolactone    Social History:  The patient  reports that she has never smoked. She has never been exposed to tobacco smoke. She has never used smokeless tobacco. She reports that she does not drink alcohol and does not use drugs.   Family History:  The patient's family history includes Atrial  fibrillation in her mother; Colon cancer (age of onset: 20) in her cousin; Diabetes in her father; Heart attack in her mother; Heart failure in her mother; Hypertension in her father and mother; Parkinson's disease in her father; Rheum arthritis in her mother.    ROS:  Please see the history of present illness.   Otherwise, review of systems are positive for none.  All other systems are reviewed and negative.    PHYSICAL EXAM: VS:  LMP 07/17/2012  , BMI There is no height or weight on file to calculate BMI. GEN: Well nourished, well developed, in no acute distress  HEENT: normal  Neck: no JVD, carotid bruits, or masses Cardiac: Irregularly irregular; no murmurs, rubs, or gallops, mild edema  Respiratory:  clear to auscultation bilaterally, normal work of breathing GI: soft, nontender, nondistended, + BS MS: no deformity or atrophy  Skin: warm and dry, no rash Neuro:  Strength and sensation are intact Psych: euthymic mood, full affect   EKG:  EKG is ordered today. The ekg ordered today demonstrates atrial fibrillation with ventricular rate of 87 bpm.   Recent Labs: 09/30/2022: BUN 50; Creatinine, Ser 8.93; Hemoglobin 12.4; Platelets 156; Potassium 5.0; Sodium 136    Lipid Panel    Component Value Date/Time   CHOL 122 02/09/2020 1340   TRIG 153 (H) 02/09/2020 1340   HDL 49 02/09/2020 1340   CHOLHDL 2.5 02/09/2020 1340   VLDL 31 02/09/2020 1340   LDLCALC 42 02/09/2020 1340      Wt Readings from Last 3 Encounters:  01/28/23 (!) 352 lb 11.8 oz (160 kg)  01/05/23 (!) 350 lb 8.5 oz (159 kg)  12/04/22 (!) 350 lb 9.6 oz (159 kg)            No data to display            ASSESSMENT AND PLAN:  1.  Permanent atrial fibrillation: Ventricular rate is reasonably controlled on Toprol 25 mg once daily.  Continue anticoagulation with Eliquis 5 mg twice daily.    2. Chronic diastolic heart failure: She appears to be euvolemic.  Fluid status is managed by dialysis.  3.   Essential hypertension: Blood pressure has been running low especially on days of dialysis.  I elected to decrease clonidine to 0.1 mg twice daily.  4. Morbid obesity: She is trying to lose weight but has not been successful.  5. History of pulmonary embolism: On anticoagulation with Eliquis.  6.  Obstructive sleep apnea: On CPAP  7.  End-stage renal disease on hemodialysis managed by nephrology    Disposition:   FU with me in 6 months  Signed,  Lorine Bears, MD  02/09/2023 7:35 AM    [facility]

## 2023-02-13 ENCOUNTER — Other Ambulatory Visit (INDEPENDENT_AMBULATORY_CARE_PROVIDER_SITE_OTHER): Payer: Self-pay | Admitting: Vascular Surgery

## 2023-02-13 DIAGNOSIS — N186 End stage renal disease: Secondary | ICD-10-CM

## 2023-02-16 ENCOUNTER — Ambulatory Visit (INDEPENDENT_AMBULATORY_CARE_PROVIDER_SITE_OTHER): Payer: BC Managed Care – PPO | Admitting: Nurse Practitioner

## 2023-02-16 ENCOUNTER — Encounter (INDEPENDENT_AMBULATORY_CARE_PROVIDER_SITE_OTHER): Payer: Self-pay | Admitting: Nurse Practitioner

## 2023-02-16 ENCOUNTER — Ambulatory Visit (INDEPENDENT_AMBULATORY_CARE_PROVIDER_SITE_OTHER): Payer: BC Managed Care – PPO

## 2023-02-16 VITALS — BP 105/57 | HR 114 | Resp 16 | Wt 353.0 lb

## 2023-02-16 DIAGNOSIS — N186 End stage renal disease: Secondary | ICD-10-CM | POA: Diagnosis not present

## 2023-02-16 DIAGNOSIS — I10 Essential (primary) hypertension: Secondary | ICD-10-CM

## 2023-02-16 DIAGNOSIS — E1165 Type 2 diabetes mellitus with hyperglycemia: Secondary | ICD-10-CM | POA: Diagnosis not present

## 2023-02-18 ENCOUNTER — Encounter (INDEPENDENT_AMBULATORY_CARE_PROVIDER_SITE_OTHER): Payer: Self-pay | Admitting: Nurse Practitioner

## 2023-02-18 NOTE — Progress Notes (Signed)
 Subjective:    Patient ID: Nicole Schmidt, female    DOB: September 04, 1965, 58 y.o.   MRN: [id number] Chief Complaint  Patient presents with   Follow-up    [facility] 4 week with HDA    The patient returns to the office for followup status post intervention of their dialysis access 01/05/2023.   Following the intervention the access function has significantly improved, with better flow rates and improved KT/V. The patient has not been experiencing increased bleeding times following decannulation and the patient denies increased recirculation. The patient denies an increase in arm swelling. At the present time the patient denies hand pain.  No recent shortening of the patient's walking distance or new symptoms consistent with claudication.  No history of rest pain symptoms. No new ulcers or wounds of the lower extremities have occurred.  The patient denies amaurosis fugax or recent TIA symptoms. There are no recent neurological changes noted. There is no history of DVT, PE or superficial thrombophlebitis. No recent episodes of angina or shortness of breath documented.   Duplex ultrasound of the AV access shows a patent access.  The previously noted stenosis is improved compared to last study.  Flow volume today is 607 cc/min (previous flow volume was 927 cc/min)       Review of Systems  Hematological:  Does not bruise/bleed easily.  All other systems reviewed and are negative.      Objective:   Physical Exam Vitals reviewed.  HENT:     Head: Normocephalic.  Cardiovascular:     Rate and Rhythm: Normal rate.     Pulses: Normal pulses.     Arteriovenous access: Left arteriovenous access is present.    Comments: Good bruit distally was softer bruit proximal Pulmonary:     Effort: Pulmonary effort is normal.  Skin:    General: Skin is warm and dry.  Neurological:     Mental Status: She is alert and oriented to person, place, and time.  Psychiatric:        Mood and Affect: Mood normal.         Behavior: Behavior normal.        Thought Content: Thought content normal.        Judgment: Judgment normal.     BP (!) 105/57   Pulse (!) 114   Resp 16   Wt (!) 353 lb (160.1 kg)   LMP 07/17/2012   BMI 66.70 kg/m   Past Medical History:  Diagnosis Date   (HFpEF) heart failure with preserved ejection fraction (HCC)    a. 10/2018 Echo: EF 60-65%, diast dysfxn, RVSP 50.7 mmHg; b. 01/2020 Echo: EF 65-70%, G2DD; c. 01/2021 Echo: EF 60-65%, no rwma, mod LVH. Nl RV fxn, mildly enlarged RV. Mod BAE. Triv MR. d. 05/2021 Echo: EF 55-60; no RWMAs, mild LVH, mild TR.   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 Er/PR pos, her 2 negative.  Margins 1 mm.   COPD (chronic obstructive pulmonary disease) (HCC)    Diabetes mellitus type 2 in obese 02/09/2020   Difficult intubation 09/28/2021   a.) mallampati III-IV; BMI >55 kg/m   Diverticulosis    Dyslipidemia 02/09/2020   ESBL (extended spectrum beta-lactamase) producing bacteria infection 03/20/2021   a. urine  culture (+) for ESBL producing E.coli and P. mirabilis   ESRD (end stage renal disease) on dialysis (HCC)    a. T-Th-Sat HD initiated 02/2021   Essential hypertension    Gout    History of kidney stones    Long term current use of anticoagulant    a.) apixaban    Menopause    age 45   Morbid obesity (HCC)    OSA treated with BiPAP    a. on nocturnal PAP therapy; Trilogy 100 ventilator   Permanent atrial fibrillation (HCC)    a. Dx 10/2018 in setting of PNA. b. CHA2DS2-VASc = 7 (sex, HFpEF, HTN, PE x2, aortic plaque, T2DM); c. s/p successful DCCV 11/2018. d. subsequent recurrent Afib; rate/rhythm maintained on oral metoprolol  succinate; chronically anticoagulated using standard dose apixaban .   Personal history of radiation  therapy    Pulmonary embolism (HCC) 10/2014   a. on chronic apixaban    Restrictive lung disease    Shingles 10/24/2021   Sleep difficulties    a.) takes melatonin   Thyroid  goiter     Social History   Socioeconomic History   Marital status: Single    Spouse name: Not on file   Number of children: Not on file   Years of education: Not on file   Highest education level: Not on file  Occupational History   Occupation: retired  Tobacco Use   Smoking status: Never    Passive exposure: Never   Smokeless tobacco: Never  Vaping Use   Vaping status: Never Used  Substance and Sexual Activity   Alcohol use: No    Alcohol/week: 0.0 standard drinks of alcohol   Drug use: No   Sexual activity: Not on file  Other Topics Concern   Not on file  Social History Narrative   Lives in [place] by herself and retired.    Social Drivers of Corporate Investment Banker Strain: Low Risk  (12/22/2022)   Received from [facility]   Overall Financial Resource Strain (CARDIA)    Difficulty of Paying Living Expenses: Not hard at all  Food Insecurity: No Food Insecurity (12/22/2022)   Received from [facility]   Hunger Vital Sign    Worried About Running Out of Food in the Last Year: Never true    Ran Out of Food in the Last Year: Never true  Transportation Needs: No Transportation Needs (12/22/2022)   Received from [facility] - Transportation    In the past 12 months, has lack of transportation kept you from medical appointments or from getting medications?: No    Lack of Transportation (Non-Medical): No  Physical Activity: Unknown (11/15/2017)   Exercise Vital Sign    Days of Exercise per Week: Patient declined    Minutes of Exercise per Session: Patient declined  Stress: Unknown (11/15/2017)   [employer] of Occupational [employer] - Occupational Stress Questionnaire    Feeling of Stress : Patient declined  Social  Connections: Unknown (11/15/2017)   Social Connection and Isolation Panel [NHANES]    Frequency of Communication with Friends and Family: Patient declined    Frequency of Social Gatherings with Friends and Family: Patient declined    Attends Religious Services: Patient declined    Database Administrator or Organizations: Patient declined    Attends Banker Meetings: Patient declined    Marital Status: Patient declined  Intimate Partner Violence: Unknown (11/15/2017)   Humiliation, Afraid, Rape, and Kick questionnaire  Fear of Current or Ex-Partner: Patient declined    Emotionally Abused: Patient declined    Physically Abused: Patient declined    Sexually Abused: Patient declined    Past Surgical History:  Procedure Laterality Date   A/V FISTULAGRAM Left 09/02/2021   Procedure: A/V Fistulagram;  Surgeon: Jama Cordella MATSU, MD;  Location: [facility]IVE [facility];  Service: Cardiovascular;  Laterality: Left;   A/V FISTULAGRAM Left 06/27/2022   Procedure: A/V Fistulagram;  Surgeon: Jama Cordella MATSU, MD;  Location: [facility];  Service: Cardiovascular;  Laterality: Left;   A/V FISTULAGRAM Left 01/05/2023   Procedure: A/V Fistulagram;  Surgeon: Jama Cordella MATSU, MD;  Location: [facility];  Service: Cardiovascular;  Laterality: Left;   AV FISTULA PLACEMENT Left 07/22/2021   Procedure: ARTERIOVENOUS (AV) FISTULA CREATION (RADIALCEPHALIC);  Surgeon: Jama Cordella MATSU, MD;  Location: [facility] ORS;  Service: Vascular;  Laterality: Left;   AV FISTULA PLACEMENT Left 12/09/2021   Procedure: ARTERIOVENOUS (AV) FISTULA CREATION (BRACHIAL CEPHALIC);  Surgeon: Jama Cordella MATSU, MD;  Location: [facility] ORS;  Service: Vascular;  Laterality: Left;   BREAST BIOPSY Right 2014   breast ca   BREAST EXCISIONAL BIOPSY Right 07/19/2012   breast ca   BREAST SURGERY Right 2014   with sentinel node bx subareaolar duct excision   CARDIOVERSION N/A 11/28/2018   Procedure: CARDIOVERSION;   Surgeon: Perla Evalene PARAS, MD;  Location: [facility] ORS;  Service: Cardiovascular;  Laterality: N/A;   COLONOSCOPY WITH PROPOFOL  N/A 01/30/2017   Procedure: COLONOSCOPY WITH PROPOFOL ;  Surgeon: Dellie Louanne MATSU, MD;  Location: [facility] ENDOS[facility];  Service: Endoscopy;  Laterality: N/A;   CYSTOSCOPY W/ RETROGRADES  06/17/2021   Procedure: CYSTOSCOPY WITH RETROGRADE PYELOGRAM;  Surgeon: Francisca Redell BROCKS, MD;  Location: [facility];  Service: Urology;;   CYSTOSCOPY W/ URETERAL STENT PLACEMENT Left 02/14/2021   Procedure: CYSTOSCOPY WITH RETROGRADE PYELOGRAM/URETERAL STENT PLACEMENT;  Surgeon: Francisca Redell BROCKS, MD;  Location: [facility];  Service: Urology;  Laterality: Left;   CYSTOSCOPY/URETEROSCOPY/HOLMIUM LASER/STENT PLACEMENT Left 06/17/2021   Procedure: CYSTOSCOPY/URETEROSCOPY/HOLMIUM LASER/STENT EXCHANGE;  Surgeon: Francisca Redell BROCKS, MD;  Location: [facility] OR[facility];  Service: Urology;  Laterality: Left;   DIALYSIS/PERMA CATHETER INSERTION N/A 02/24/2021   Procedure: DIALYSIS/PERMA CATHETER INSERTION;  Surgeon: Marea Selinda RAMAN, MD;  Location: [facility];  Service: Cardiovascular;  Laterality: N/A;   DIALYSIS/PERMA CATHETER INSERTION N/A 12/16/2021   Procedure: DIALYSIS/PERMA CATHETER INSERTION;  Surgeon: Jama Cordella MATSU, MD;  Location: [facility];  Service: Cardiovascular;  Laterality: N/A;   DIALYSIS/PERMA CATHETER REMOVAL N/A 09/04/2022   Procedure: DIALYSIS/PERMA CATHETER REMOVAL;  Surgeon: Marea Selinda RAMAN, MD;  Location: [facility];  Service: Cardiovascular;  Laterality: N/A;   PERIPHERAL VASCULAR THROMBECTOMY Left 09/28/2021   Procedure: PERIPHERAL VASCULAR THROMBECTOMY;  Surgeon: Jama Cordella MATSU, MD;  Location: [facility];  Service: Cardiovascular;  Laterality: Left;   REVISON OF ARTERIOVENOUS FISTULA Left 04/28/2022   Procedure: REVISON OF ARTERIOVENOUS FISTULA (BRACHIAL CEPHALIC);  Surgeon: Jama Cordella MATSU, MD;  Location: [facility];  Service: Vascular;  Laterality: Left;     Family History  Problem Relation Age of Onset   Diabetes Father    Hypertension Father    Parkinson's disease Father    Hypertension Mother    Rheum arthritis Mother    Atrial fibrillation Mother    Heart failure Mother    Heart attack Mother        died in her mid-70's.   Colon cancer Cousin 72  Breast cancer Neg Hx     Allergies  Allergen Reactions   Spironolactone      Hyperkalemia  Pt unaware of this allergy       Latest Ref Rng & Units 09/30/2022   10:55 AM 04/28/2022    6:39 AM 04/21/2022   11:09 AM  CBC  WBC 4.0 - 10.5 K/uL 11.3   8.4   Hemoglobin 12.0 - 15.0 g/dL 87.5  86.6  87.7   Hematocrit 36.0 - 46.0 % 41.0  39.0  40.0   Platelets 150 - 400 K/uL 156   196       CMP     Component Value Date/Time   NA 136 09/30/2022 1055   NA 144 02/27/2020 0931   NA 141 03/09/2014 1533   K 5.0 09/30/2022 1055   K 4.2 03/09/2014 1533   CL 99 09/30/2022 1055   CL 101 03/09/2014 1533   CO2 27 09/30/2022 1055   CO2 30 03/09/2014 1533   GLUCOSE 180 (H) 09/30/2022 1055   GLUCOSE 95 03/09/2014 1533   BUN 50 (H) 09/30/2022 1055   BUN 40 (H) 02/27/2020 0931   BUN 35 (H) 03/09/2014 1533   CREATININE 8.93 (H) 09/30/2022 1055   CREATININE 1.50 (H) 03/09/2014 1533   CALCIUM  8.2 (L) 09/30/2022 1055   CALCIUM  8.8 03/09/2014 1533   PROT 7.5 07/26/2021 2222   PROT 7.3 03/09/2014 1533   ALBUMIN  3.6 07/26/2021 2222   ALBUMIN  3.5 03/09/2014 1533   AST 17 07/26/2021 2222   AST 16 03/09/2014 1533   ALT 6 07/26/2021 2222   ALT 23 03/09/2014 1533   ALKPHOS 162 (H) 07/26/2021 2222   ALKPHOS 113 03/09/2014 1533   BILITOT 0.8 07/26/2021 2222   BILITOT 0.3 03/09/2014 1533   GFRNONAA 5 (L) 09/30/2022 1055   GFRNONAA 39 (L) 03/09/2014 1533   GFRNONAA 55 (L) 07/31/2013 1521     No results found.     Assessment & Plan:   1. ESRD (end stage renal disease) (HCC) (Primary) Recommend:  The patient is doing well and currently has adequate dialysis access.  Although there  are some parameters suggesting possible future issues.  The patient's dialysis center is not reporting any major access issues.  However, the flow rates in the patient's dialysis access are low, in the prethrombotic range, and there is no exact stenosis identified.  This raises concerns that the access is at moderate but not high risk for a problem or thrombosis and should be followed more closely  The patient will follow-up with me in the office in 1 months.  The need for a follow up duplex ultrasound will be made at that time based on whether problems with the access are persistent.    2. Essential hypertension Continue antihypertensive medications as already ordered, these medications have been reviewed and there are no changes at this time.  3. Controlled type 2 diabetes mellitus with hyperglycemia, without long-term current use of insulin  (HCC) Continue hypoglycemic medications as already ordered, these medications have been reviewed and there are no changes at this time.  Hgb A1C to be monitored as already arranged by primary service   Current Outpatient Medications on File Prior to Visit  Medication Sig Dispense Refill   apixaban  (ELIQUIS ) 5 MG TABS tablet Take 1 tablet (5 mg total) by mouth 2 (two) times daily. 180 tablet 1   aspirin  EC 81 MG tablet Take 1 tablet (81 mg total) by mouth daily. Swallow whole. 150 tablet 1  atorvastatin  (LIPITOR) 40 MG tablet Take 40 mg by mouth daily.     calcium  acetate (PHOSLO ) 667 MG capsule Take 667 mg by mouth 3 (three) times daily.     cinacalcet  (SENSIPAR ) 30 MG tablet Take 30 mg by mouth daily.     cloNIDine  (CATAPRES ) 0.1 MG tablet Take 1 tablet (0.1 mg total) by mouth 2 (two) times daily. 180 tablet 2   CRANBERRY PO Take 1 capsule by mouth daily.     ezetimibe  (ZETIA ) 10 MG tablet Take 10 mg by mouth daily with supper.     ferric gluconate 250 mg in sodium chloride  0.9 % 250 mL Inject 250 mg into the vein Every Tuesday,Thursday,and Saturday  with dialysis.     fluticasone -salmeterol (ADVAIR DISKUS) 100-50 MCG/ACT AEPB Inhale 1 puff into the lungs 2 (two) times daily. 60 each 6   gabapentin  (NEURONTIN ) 300 MG capsule Take 1 capsule (300 mg total) by mouth at bedtime. 30 capsule 0   Lancets (ONETOUCH DELICA PLUS LANCET30G) MISC      levothyroxine  (SYNTHROID ) 112 MCG tablet Take 1 tablet (112 mcg total) by mouth daily before breakfast. 30 tablet 0   melatonin 5 MG TABS Take 2 tablets (10 mg total) by mouth at bedtime as needed (sleep). 60 tablet 0   Melatonin-Pyridoxine 5-1 MG TABS Take by mouth.     metoprolol  succinate (TOPROL -XL) 25 MG 24 hr tablet Take 1 tablet (25 mg total) by mouth at bedtime. Take with or immediately following a meal. 90 tablet 3   midodrine  (PROAMATINE ) 10 MG tablet Take 1 tablet (10 mg total) by mouth Every Tuesday,Thursday,and Saturday with dialysis. 30 tablet 0   multivitamin (RENA-VIT) TABS tablet Take 1 tablet by mouth daily.     ONETOUCH ULTRA test strip      polyethylene glycol powder (GLYCOLAX /MIRALAX ) 17 GM/SCOOP powder Take 1 Container by mouth once.     TRULICITY  1.5 MG/0.5ML SOPN Inject 1.5 mg into the skin every Monday.     VELPHORO 500 MG chewable tablet Chew 1,000 mg by mouth 3 (three) times daily.     acetaminophen  (TYLENOL ) 325 MG tablet Take 2 tablets (650 mg total) by mouth every 6 (six) hours as needed for mild pain (or Fever >/= 101). (Patient not taking: Reported on 08/23/2022)     albuterol  (VENTOLIN  HFA) 108 (90 Base) MCG/ACT inhaler Inhale 2 puffs into the lungs every 6 (six) hours as needed for wheezing or shortness of breath. (Patient not taking: Reported on 01/05/2023) 8.5 g 0   Cholecalciferol (VITAMIN D3) 1.25 MG (50000 UT) CAPS Take 1 capsule by mouth once a week. (Patient not taking: Reported on 11/03/2022)     ciprofloxacin  (CIPRO ) 250 MG tablet Take 1 tablet (250 mg total) by mouth 2 (two) times daily. (Patient not taking: Reported on 01/05/2023) 14 tablet 0   ergocalciferol   (VITAMIN D2) 1.25 MG (50000 UT) capsule Take 1 capsule (50,000 Units total) by mouth once a week. (Patient not taking: Reported on 10/13/2022) 5 capsule 0   folic acid  (FOLVITE ) 1 MG tablet Take 1 tablet (1 mg total) by mouth daily. 30 tablet 0   HYDROcodone -acetaminophen  (NORCO) 5-325 MG tablet Take 1 tablet by mouth every 6 (six) hours as needed for moderate pain. 30 tablet 0   SSD 1 % cream Apply topically 2 (two) times daily.     traMADol  (ULTRAM ) 50 MG tablet Take 1 tablet (50 mg total) by mouth every 12 (twelve) hours as needed for moderate pain. 30 tablet  0   No current facility-administered medications on file prior to visit.    There are no Patient Instructions on file for this visit. No follow-ups on file.   Tracen Mahler E Javante Nilsson, NP

## 2023-02-19 DIAGNOSIS — E662 Morbid (severe) obesity with alveolar hypoventilation: Secondary | ICD-10-CM | POA: Diagnosis not present

## 2023-02-23 DIAGNOSIS — J9601 Acute respiratory failure with hypoxia: Secondary | ICD-10-CM | POA: Diagnosis not present

## 2023-02-23 DIAGNOSIS — I5032 Chronic diastolic (congestive) heart failure: Secondary | ICD-10-CM | POA: Diagnosis not present

## 2023-02-23 DIAGNOSIS — I482 Chronic atrial fibrillation, unspecified: Secondary | ICD-10-CM | POA: Diagnosis not present

## 2023-02-23 DIAGNOSIS — G4733 Obstructive sleep apnea (adult) (pediatric): Secondary | ICD-10-CM | POA: Diagnosis not present

## 2023-03-02 ENCOUNTER — Encounter: Payer: Self-pay | Admitting: Nurse Practitioner

## 2023-03-02 ENCOUNTER — Ambulatory Visit: Payer: BC Managed Care – PPO | Attending: Nurse Practitioner | Admitting: Nurse Practitioner

## 2023-03-02 VITALS — BP 130/71 | HR 99 | Temp 98.0°F | Ht 61.0 in | Wt 355.2 lb

## 2023-03-02 DIAGNOSIS — I10 Essential (primary) hypertension: Secondary | ICD-10-CM | POA: Diagnosis not present

## 2023-03-02 DIAGNOSIS — E785 Hyperlipidemia, unspecified: Secondary | ICD-10-CM

## 2023-03-02 DIAGNOSIS — I4821 Permanent atrial fibrillation: Secondary | ICD-10-CM

## 2023-03-02 DIAGNOSIS — I5032 Chronic diastolic (congestive) heart failure: Secondary | ICD-10-CM

## 2023-03-02 DIAGNOSIS — N186 End stage renal disease: Secondary | ICD-10-CM | POA: Diagnosis not present

## 2023-03-02 NOTE — Patient Instructions (Signed)
Medication Instructions:  No changes *If you need a refill on your cardiac medications before your next appointment, please call your pharmacy*   Lab Work: Your provider would like for you to have the following labs today: Lipid and Liver  If you have labs (blood work) drawn today and your tests are completely normal, you will receive your results only by: MyChart Message (if you have MyChart) OR A paper copy in the mail If you have any lab test that is abnormal or we need to change your treatment, we will call you to review the results.   Testing/Procedures: None ordered   Follow-Up: At [facility], you and your health needs are our priority.  As part of our continuing mission to provide you with exceptional heart care, we have created designated Provider Care Teams.  These Care Teams include your primary Cardiologist (physician) and Advanced Practice Providers (APPs -  Physician Assistants and Nurse Practitioners) who all work together to provide you with the care you need, when you need it.  We recommend signing up for the patient portal called "MyChart".  Sign up information is provided on this After Visit Summary.  MyChart is used to connect with patients for Virtual Visits (Telemedicine).  Patients are able to view lab/test results, encounter notes, upcoming appointments, etc.  Non-urgent messages can be sent to your provider as well.   To learn more about what you can do with MyChart, go to [email or web address].    Your next appointment:   6 month(s)  Provider:   You may see Lorine Bears, MD or one of the following Advanced Practice Providers on your designated Care Team:   Nicolasa Ducking, NP

## 2023-03-02 NOTE — Progress Notes (Signed)
Office Visit    Patient Name: Nicole Schmidt Date of Encounter: 03/02/2023  Primary Care Provider:  Mick Sell, MD Primary Cardiologist:  Lorine Bears, MD  Chief Complaint    58 y.o. female with history of breast cancer status postradiation, chronic HFpEF, permanent atrial fibrillation, hypertension, hyperlipidemia, type 2 diabetes mellitus, end-stage renal disease, anemia, obesity, hypothyroidism, and pulm embolism, who presents for follow-up related to HFpEF.   Past Medical History  Subjective   Past Medical History:  Diagnosis Date   (HFpEF) heart failure with preserved ejection fraction (HCC)    a. 10/2018 Echo: EF 60-65%, diast dysfxn, RVSP 50.7 mmHg; b. 01/2020 Echo: EF 65-70%, G2DD; c. 01/2021 Echo: EF 60-65%, no rwma, mod LVH. Nl RV fxn, mildly enlarged RV. Mod BAE. Triv MR. d. 05/2021 Echo: EF 55-60; no RWMAs, mild LVH, mild TR.   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 Er/PR pos, her 2 negative.  Margins 1 mm.   COPD (chronic obstructive pulmonary disease) (HCC)    Diabetes mellitus type 2 in obese 02/09/2020   Difficult intubation 09/28/2021   a.) mallampati III-IV; BMI >55 kg/m   Diverticulosis    Dyslipidemia 02/09/2020   ESBL (extended spectrum beta-lactamase) producing bacteria infection 03/20/2021   a. urine culture (+) for ESBL producing E.coli and P. mirabilis   ESRD (end stage renal disease) on dialysis (HCC)    a. T-Th-Sat HD initiated 02/2021   Essential hypertension    Gout    History of kidney stones    Long term current use of anticoagulant    a.) apixaban   Menopause    age 68   Morbid obesity (HCC)    OSA treated with BiPAP    a. on nocturnal PAP therapy; Trilogy 100 ventilator   Permanent atrial fibrillation (HCC)    a. Dx  10/2018 in setting of PNA. b. CHA2DS2-VASc = 7 (sex, HFpEF, HTN, PE x2, aortic plaque, T2DM); c. s/p successful DCCV 11/2018. d. subsequent recurrent Afib; rate/rhythm maintained on oral metoprolol succinate; chronically anticoagulated using standard dose apixaban.   Personal history of radiation therapy    Pulmonary embolism (HCC) 10/2014   a. on chronic apixaban   Restrictive lung disease    Shingles 10/24/2021   Sleep difficulties    a.) takes melatonin   Thyroid goiter    Past Surgical History:  Procedure Laterality Date   A/V FISTULAGRAM Left 09/02/2021   Procedure: A/V Fistulagram;  Surgeon: Renford Dills, MD;  Location: [facility];  Service: Cardiovascular;  Laterality: Left;   A/V FISTULAGRAM Left 06/27/2022   Procedure: A/V Fistulagram;  Surgeon: Renford Dills, MD;  Location: [facility];  Service: Cardiovascular;  Laterality: Left;   A/V FISTULAGRAM Left 01/05/2023   Procedure: A/V Fistulagram;  Surgeon: Renford Dills, MD;  Location: [facility];  Service: Cardiovascular;  Laterality: Left;   AV FISTULA PLACEMENT Left 07/22/2021   Procedure: ARTERIOVENOUS (AV) FISTULA CREATION (RADIALCEPHALIC);  Surgeon: Renford Dills, MD;  Location: [facility];  Service: Vascular;  Laterality: Left;   AV FISTULA PLACEMENT Left 12/09/2021   Procedure: ARTERIOVENOUS (AV) FISTULA CREATION (BRACHIAL CEPHALIC);  Surgeon: Renford Dills, MD;  Location: [facility];  Service: Vascular;  Laterality: Left;   BREAST BIOPSY Right 2014   breast ca   BREAST EXCISIONAL BIOPSY Right 07/19/2012   breast ca   BREAST SURGERY Right 2014   with sentinel node bx subareaolar duct excision   CARDIOVERSION N/A 11/28/2018   Procedure: CARDIOVERSION;  Surgeon: Antonieta Iba, MD;  Location: [facility];  Service: Cardiovascular;  Laterality: N/A;   COLONOSCOPY WITH PROPOFOL N/A 01/30/2017   Procedure: COLONOSCOPY WITH PROPOFOL;  Surgeon: Kieth Brightly, MD;  Location:  [facility];  Service: Endoscopy;  Laterality: N/A;   CYSTOSCOPY W/ RETROGRADES  06/17/2021   Procedure: CYSTOSCOPY WITH RETROGRADE PYELOGRAM;  Surgeon: Sondra Come, MD;  Location: [facility];  Service: Urology;;   CYSTOSCOPY W/ URETERAL STENT PLACEMENT Left 02/14/2021   Procedure: CYSTOSCOPY WITH RETROGRADE PYELOGRAM/URETERAL STENT PLACEMENT;  Surgeon: Sondra Come, MD;  Location: [facility];  Service: Urology;  Laterality: Left;   CYSTOSCOPY/URETEROSCOPY/HOLMIUM LASER/STENT PLACEMENT Left 06/17/2021   Procedure: CYSTOSCOPY/URETEROSCOPY/HOLMIUM LASER/STENT EXCHANGE;  Surgeon: Sondra Come, MD;  Location: [facility];  Service: Urology;  Laterality: Left;   DIALYSIS/PERMA CATHETER INSERTION N/A 02/24/2021   Procedure: DIALYSIS/PERMA CATHETER INSERTION;  Surgeon: Annice Needy, MD;  Location: [facility];  Service: Cardiovascular;  Laterality: N/A;   DIALYSIS/PERMA CATHETER INSERTION N/A 12/16/2021   Procedure: DIALYSIS/PERMA CATHETER INSERTION;  Surgeon: Renford Dills, MD;  Location: [facility];  Service: Cardiovascular;  Laterality: N/A;   DIALYSIS/PERMA CATHETER REMOVAL N/A 09/04/2022   Procedure: DIALYSIS/PERMA CATHETER REMOVAL;  Surgeon: Annice Needy, MD;  Location: [facility];  Service: Cardiovascular;  Laterality: N/A;   PERIPHERAL VASCULAR THROMBECTOMY Left 09/28/2021   Procedure: PERIPHERAL VASCULAR THROMBECTOMY;  Surgeon: Renford Dills, MD;  Location: [facility];  Service: Cardiovascular;  Laterality: Left;   REVISON OF ARTERIOVENOUS FISTULA Left 04/28/2022   Procedure: REVISON OF ARTERIOVENOUS FISTULA (BRACHIAL CEPHALIC);  Surgeon: Renford Dills, MD;  Location: [facility];  Service: Vascular;  Laterality: Left;    Allergies  Allergies  Allergen Reactions   Spironolactone     Hyperkalemia  Pt unaware of this allergy      History of Present Illness      58 y.o. y/o female  with above complex past medical history including breast  cancer status postradiation, chronic HFpEF, permanent atrial fibrillation, hypertension, hyperlipidemia, diabetes, end-stage renal disease, anemia, obesity, hypothyroidism, and pulmonary embolism.  She has been on chronic Eliquis since PE in September 2016.  She was diagnosed with atrial fibrillation in September 2020, and subsequently underwent cardioversion in October 2020, without significant improvement in dyspnea and fatigue.  In December 2021, she was hospitalized with respiratory failure secondary to heart failure and pneumonia.  Echo showed EF of 65% with severe LVH.  Spironolactone was discontinued secondary to hyperkalemia.  She had recurrent atrial fibrillation July 2022 and was minimally symptomatic.  Due to multiple comorbidities, it was felt that maintaining sinus rhythm would be difficult and decision was made to continue a rate control strategy.  She was initiated on hemodialysis in January 2023 in the setting of admission for heart failure and acute renal failure.  In the setting of obesity hypoventilation syndrome, she was prescribed a trilogy device at home.  She required ER visit in March 2023 secondary to hypoxia and altered mental status that occurred after she took off her trilogy device and then failed to reapply it.  Troponin was mildly elevated at 161.  Limited echo at that time showed an EF of  55 to 60% without regional wall motion abnormalities.  Due to body habitus and lack of symptoms, we did not pursue ischemic testing.  She has since been on Tuesday, Thursday, Saturday hemodialysis.    Ms. Fonda was last seen in cardiology clinic in June 2024 at which time her clonidine dose was reduced in the setting of low blood pressures especially on dialysis days.  Overall, she has been stable since her last visit.  Pressures sometimes run low at hemodialysis, though she notes this is reasonably well-managed by taking midodrine prior to sessions and also holding her clonidine dose mornings.   She has not had to skip any dialysis sessions due to low blood pressures and she has never been symptomatic when pressures drop at dialysis.  She has chronic dyspnea on exertion.  She denies chest pain, palpitations, PND, orthopnea, dizziness, syncope, or or early satiety.  She sometimes notes lower extremity swelling but overall, feels that her fluid is managed well via dialysis Objective  Home Medications    Current Outpatient Medications  Medication Sig Dispense Refill   acetaminophen (TYLENOL) 325 MG tablet Take 2 tablets (650 mg total) by mouth every 6 (six) hours as needed for mild pain (or Fever >/= 101).     apixaban (ELIQUIS) 5 MG TABS tablet Take 1 tablet (5 mg total) by mouth 2 (two) times daily. 180 tablet 1   aspirin EC 81 MG tablet Take 1 tablet (81 mg total) by mouth daily. Swallow whole. 150 tablet 1   atorvastatin (LIPITOR) 40 MG tablet Take 40 mg by mouth daily.     calcium acetate (PHOSLO) 667 MG capsule Take 667 mg by mouth 3 (three) times daily.     cinacalcet (SENSIPAR) 30 MG tablet Take 30 mg by mouth daily.     cloNIDine (CATAPRES) 0.1 MG tablet Take 1 tablet (0.1 mg total) by mouth 2 (two) times daily. 180 tablet 2   CRANBERRY PO Take 1 capsule by mouth daily.     ezetimibe (ZETIA) 10 MG tablet Take 10 mg by mouth daily with supper.     ferric gluconate 250 mg in sodium chloride 0.9 % 250 mL Inject 250 mg into the vein Every Tuesday,Thursday,and Saturday with dialysis.     fluticasone-salmeterol (ADVAIR DISKUS) 100-50 MCG/ACT AEPB Inhale 1 puff into the lungs 2 (two) times daily. 60 each 6   gabapentin (NEURONTIN) 300 MG capsule Take 1 capsule (300 mg total) by mouth at bedtime. 30 capsule 0   Lancets (ONETOUCH DELICA PLUS LANCET30G) MISC      levothyroxine (SYNTHROID) 112 MCG tablet Take 1 tablet (112 mcg total) by mouth daily before breakfast. 30 tablet 0   melatonin 5 MG TABS Take 2 tablets (10 mg total) by mouth at bedtime as needed (sleep). 60 tablet 0    Melatonin-Pyridoxine 5-1 MG TABS Take by mouth.     metoprolol succinate (TOPROL-XL) 25 MG 24 hr tablet Take 1 tablet (25 mg total) by mouth at bedtime. Take with or immediately following a meal. 90 tablet 3   midodrine (PROAMATINE) 10 MG tablet Take 1 tablet (10 mg total) by mouth Every Tuesday,Thursday,and Saturday with dialysis. 30 tablet 0   multivitamin (RENA-VIT) TABS tablet Take 1 tablet by mouth daily.     ONETOUCH ULTRA test strip      polyethylene glycol powder (GLYCOLAX/MIRALAX) 17 GM/SCOOP powder Take 1 Container by mouth once.     TRULICITY 1.5 MG/0.5ML SOPN Inject 1.5 mg into the skin every Monday.  VELPHORO 500 MG chewable tablet Chew 1,000 mg by mouth 3 (three) times daily.     albuterol (VENTOLIN HFA) 108 (90 Base) MCG/ACT inhaler Inhale 2 puffs into the lungs every 6 (six) hours as needed for wheezing or shortness of breath. (Patient not taking: Reported on 01/05/2023) 8.5 g 0   Cholecalciferol (VITAMIN D3) 1.25 MG (50000 UT) CAPS Take 1 capsule by mouth once a week. (Patient not taking: Reported on 11/03/2022)     ciprofloxacin (CIPRO) 250 MG tablet Take 1 tablet (250 mg total) by mouth 2 (two) times daily. (Patient not taking: Reported on 01/05/2023) 14 tablet 0   ergocalciferol (VITAMIN D2) 1.25 MG (50000 UT) capsule Take 1 capsule (50,000 Units total) by mouth once a week. (Patient not taking: Reported on 10/13/2022) 5 capsule 0   folic acid (FOLVITE) 1 MG tablet Take 1 tablet (1 mg total) by mouth daily. 30 tablet 0   HYDROcodone-acetaminophen (NORCO) 5-325 MG tablet Take 1 tablet by mouth every 6 (six) hours as needed for moderate pain. 30 tablet 0   SSD 1 % cream Apply topically 2 (two) times daily.     traMADol (ULTRAM) 50 MG tablet Take 1 tablet (50 mg total) by mouth every 12 (twelve) hours as needed for moderate pain. 30 tablet 0   No current facility-administered medications for this visit.     Physical Exam    VS:  BP 130/71 Comment: forearm  Pulse 99   Temp 98  F (36.7 C)   Ht 5\' 1"  (1.549 m)   Wt (!) 355 lb 3.2 oz (161.1 kg)   LMP 07/17/2012   BMI 67.11 kg/m  , BMI Body mass index is 67.11 kg/m.       GEN: Obese, in no acute distress. HEENT: normal. Neck: Supple, no JVD, carotid bruits, or masses. Cardiac: Irregularly irregular, no murmurs, rubs, or gallops. No clubbing, cyanosis, trace bilateral ankle edema.  Radials 2+/PT 2+ and equal bilaterally.  Respiratory:  Respirations regular and unlabored, clear to auscultation bilaterally. GI: Soft, nontender, nondistended, BS + x 4. MS: no deformity or atrophy. Skin: warm and dry, no rash. Neuro:  Strength and sensation are intact. Psych: Normal affect.  Accessory Clinical Findings    ECG personally reviewed by me today - EKG Interpretation Date/Time:  Friday March 02 2023 10:15:07 EST Ventricular Rate:  99 PR Interval:    QRS Duration:  88 QT Interval:  374 QTC Calculation: 479 R Axis:   22  Text Interpretation: Atrial fibrillation Low voltage QRS Confirmed by Nicolasa Ducking [phone]) on 03/02/2023 10:31:56 AM  - no acute changes.  Lab Results  Component Value Date   WBC 11.3 (H) 09/30/2022   HGB 12.4 09/30/2022   HCT 41.0 09/30/2022   MCV 115.8 (H) 09/30/2022   PLT 156 09/30/2022   Lab Results  Component Value Date   CREATININE 8.93 (H) 09/30/2022   BUN 50 (H) 09/30/2022   NA 136 09/30/2022   K 5.0 09/30/2022   CL 99 09/30/2022   CO2 27 09/30/2022   Lab Results  Component Value Date   ALT 6 07/26/2021   AST 17 07/26/2021   ALKPHOS 162 (H) 07/26/2021   BILITOT 0.8 07/26/2021   Lab Results  Component Value Date   CHOL 122 02/09/2020   HDL 49 02/09/2020   LDLCALC 42 02/09/2020   TRIG 153 (H) 02/09/2020   CHOLHDL 2.5 02/09/2020    Lab Results  Component Value Date   HGBA1C 7.2 (H) 02/09/2021  Lab Results  Component Value Date   TSH 0.856 03/26/2021       Assessment & Plan    1.  Permanent atrial fibrillation: Rates in the 90s today.  She is  asymptomatic.  She remains anticoagulated with Eliquis and lab work is followed closely by nephrology.  In the setting of mild elevation of heart rates and history of soft blood pressures on dialysis days, we discussed strategies to potentially increase metoprolol while reducing and weaning off clonidine.  Overall, patient feels like her current management is in a good spot and we decided to continue current therapy (Toprol-XL 25 mg nightly).  2.  Chronic HFpEF: EF 55 to 60% by echo in April 2023.  Volume now managed by hemodialysis.  Heart rate and blood pressure stable.  3.  Primary hypertension: Stable.  She does have issues with mild hypotension during dialysis sessions requiring midodrine.  She also holds her clonidine on dialysis mornings.  Overall, she feels as though she has been tolerating hemodialysis well and has not had to cancel any sessions.  She is also not any symptomatic hypotension.  No changes to regimen today.  4.  Hyperlipidemia: She has not had lipids checked in several years.  She is fasting this morning and we will follow-up.  She remains on atorvastatin therapy.  5.  Type 2 diabetes mellitus: Followed by primary care.  6.  End-stage renal disease: Followed by nephrology and on hemodialysis.  7.  Disposition: Follow-up lipids and LFTs.  Follow-up in 6 months or sooner if necessary.  Nicolasa Ducking, NP 03/02/2023, 12:52 PM

## 2023-03-03 LAB — LIPID PANEL
Chol/HDL Ratio: 3.9 {ratio} (ref 0.0–4.4)
Cholesterol, Total: 140 mg/dL (ref 100–199)
HDL: 36 mg/dL — ABNORMAL LOW (ref >39)
LDL Chol Calc (NIH): 67 mg/dL (ref 0–99)
Triglycerides: 225 mg/dL — ABNORMAL HIGH (ref 0–149)
VLDL Cholesterol Cal: 37 mg/dL (ref 5–40)

## 2023-03-03 LAB — HEPATIC FUNCTION PANEL
ALT: 31 [IU]/L (ref 0–32)
AST: 24 [IU]/L (ref 0–40)
Albumin: 4.2 g/dL (ref 3.8–4.9)
Alkaline Phosphatase: 206 [IU]/L — ABNORMAL HIGH (ref 44–121)
Bilirubin Total: 0.6 mg/dL (ref 0.0–1.2)
Bilirubin, Direct: 0.19 mg/dL (ref 0.00–0.40)
Total Protein: 6.9 g/dL (ref 6.0–8.5)

## 2023-03-07 DIAGNOSIS — L851 Acquired keratosis [keratoderma] palmaris et plantaris: Secondary | ICD-10-CM | POA: Diagnosis not present

## 2023-03-07 DIAGNOSIS — E1140 Type 2 diabetes mellitus with diabetic neuropathy, unspecified: Secondary | ICD-10-CM | POA: Diagnosis not present

## 2023-03-07 DIAGNOSIS — R234 Changes in skin texture: Secondary | ICD-10-CM | POA: Diagnosis not present

## 2023-03-07 DIAGNOSIS — B351 Tinea unguium: Secondary | ICD-10-CM | POA: Diagnosis not present

## 2023-03-20 ENCOUNTER — Other Ambulatory Visit (INDEPENDENT_AMBULATORY_CARE_PROVIDER_SITE_OTHER): Payer: Self-pay | Admitting: Nurse Practitioner

## 2023-03-20 DIAGNOSIS — N186 End stage renal disease: Secondary | ICD-10-CM

## 2023-03-22 DIAGNOSIS — E662 Morbid (severe) obesity with alveolar hypoventilation: Secondary | ICD-10-CM | POA: Diagnosis not present

## 2023-03-22 NOTE — Progress Notes (Signed)
 MRN : [id number]  Nicole Schmidt is a 58 y.o. (November 23, 1965) female who presents with chief complaint of check access.  History of Present Illness:   The patient returns to the office for followup status post intervention of their dialysis access 01/05/2023.   Following the intervention the access function has significantly improved, with better flow rates and improved KT/V. The patient has not been experiencing increased bleeding times following decannulation and the patient denies increased recirculation. The patient denies an increase in arm swelling. At the present time the patient denies hand pain.  No recent shortening of the patient's walking distance or new symptoms consistent with claudication.  No history of rest pain symptoms. No new ulcers or wounds of the lower extremities have occurred.  The patient denies amaurosis fugax or recent TIA symptoms. There are no recent neurological changes noted. There is no history of DVT, PE or superficial thrombophlebitis. No recent episodes of angina or shortness of breath documented.   Duplex ultrasound of the AV access shows a patent access.  The previously noted stenosis is improved compared to last study.  Flow volume today is 704 cc/min (previous flow volume was 604 cc/min)     No outpatient medications have been marked as taking for the 03/26/23 encounter (Appointment) with Prescilla Brod, Ninette Basque, MD.    Past Medical History:  Diagnosis Date   (HFpEF) heart failure with preserved ejection fraction (HCC)    a. 10/2018 Echo: EF 60-65%, diast dysfxn, RVSP 50.7 mmHg; b. 01/2020 Echo: EF 65-70%, G2DD; c. 01/2021 Echo: EF 60-65%, no rwma, mod LVH. Nl RV fxn, mildly enlarged RV. Mod BAE. Triv MR. d. 05/2021 Echo: EF 55-60; no RWMAs, mild LVH, mild TR.   Acquired hypothyroidism 02/09/2020   Adenoma of left adrenal gland    Anemia    Aortic atherosclerosis (HCC)    Arthritis    Breast cancer of upper-inner quadrant of right  female breast (HCC)    Right breast invasive CA and DCIS , 7 mm T1,N0,M0. Er/PR pos, her 2 negative.  Margins 1 mm.   COPD (chronic obstructive pulmonary disease) (HCC)    Diabetes mellitus type 2 in obese 02/09/2020   Difficult intubation 09/28/2021   a.) mallampati III-IV; BMI >55 kg/m   Diverticulosis    Dyslipidemia 02/09/2020   ESBL (extended spectrum beta-lactamase) producing bacteria infection 03/20/2021   a. urine culture (+) for ESBL producing E.coli and P. mirabilis   ESRD (end stage renal disease) on dialysis (HCC)    a. T-Th-Sat HD initiated 02/2021   Essential hypertension    Gout    History of kidney stones    Long term current use of anticoagulant    a.) apixaban    Menopause    age 53   Morbid obesity (HCC)    OSA treated with BiPAP    a. on nocturnal PAP therapy; Trilogy 100 ventilator   Permanent atrial fibrillation (HCC)    a. Dx 10/2018 in setting of PNA. b. CHA2DS2-VASc = 7 (sex, HFpEF, HTN, PE x2, aortic plaque, T2DM); c. s/p successful DCCV 11/2018. d. subsequent recurrent Afib; rate/rhythm maintained on oral metoprolol  succinate; chronically anticoagulated using standard dose apixaban .   Personal history of radiation therapy    Pulmonary embolism (HCC) 10/2014   a. on chronic apixaban    Restrictive lung disease    Shingles 10/24/2021   Sleep difficulties  a.) takes melatonin   Thyroid  goiter     Past Surgical History:  Procedure Laterality Date   A/V FISTULAGRAM Left 09/02/2021   Procedure: A/V Fistulagram;  Surgeon: Jackquelyn Mass, MD;  Location: [facility];  Service: Cardiovascular;  Laterality: Left;   A/V FISTULAGRAM Left 06/27/2022   Procedure: A/V Fistulagram;  Surgeon: Jackquelyn Mass, MD;  Location: [facility];  Service: Cardiovascular;  Laterality: Left;   A/V FISTULAGRAM Left 01/05/2023   Procedure: A/V Fistulagram;  Surgeon: Jackquelyn Mass, MD;  Location: [facility];  Service: Cardiovascular;   Laterality: Left;   AV FISTULA PLACEMENT Left 07/22/2021   Procedure: ARTERIOVENOUS (AV) FISTULA CREATION (RADIALCEPHALIC);  Surgeon: Jackquelyn Mass, MD;  Location: [facility];  Service: Vascular;  Laterality: Left;   AV FISTULA PLACEMENT Left 12/09/2021   Procedure: ARTERIOVENOUS (AV) FISTULA CREATION (BRACHIAL CEPHALIC);  Surgeon: Jackquelyn Mass, MD;  Location: [facility];  Service: Vascular;  Laterality: Left;   BREAST BIOPSY Right 2014   breast ca   BREAST EXCISIONAL BIOPSY Right 07/19/2012   breast ca   BREAST SURGERY Right 2014   with sentinel node bx subareaolar duct excision   CARDIOVERSION N/A 11/28/2018   Procedure: CARDIOVERSION;  Surgeon: Devorah Fonder, MD;  Location: [facility];  Service: Cardiovascular;  Laterality: N/A;   COLONOSCOPY WITH PROPOFOL  N/A 01/30/2017   Procedure: COLONOSCOPY WITH PROPOFOL ;  Surgeon: Jerlean Mood, MD;  Location: [facility];  Service: Endoscopy;  Laterality: N/A;   CYSTOSCOPY W/ RETROGRADES  06/17/2021   Procedure: CYSTOSCOPY WITH RETROGRADE PYELOGRAM;  Surgeon: Lawerence Pressman, MD;  Location: [facility];  Service: Urology;;   CYSTOSCOPY W/ URETERAL STENT PLACEMENT Left 02/14/2021   Procedure: CYSTOSCOPY WITH RETROGRADE PYELOGRAM/URETERAL STENT PLACEMENT;  Surgeon: Lawerence Pressman, MD;  Location: [facility];  Service: Urology;  Laterality: Left;   CYSTOSCOPY/URETEROSCOPY/HOLMIUM LASER/STENT PLACEMENT Left 06/17/2021   Procedure: CYSTOSCOPY/URETEROSCOPY/HOLMIUM LASER/STENT EXCHANGE;  Surgeon: Lawerence Pressman, MD;  Location: [facility];  Service: Urology;  Laterality: Left;   DIALYSIS/PERMA CATHETER INSERTION N/A 02/24/2021   Procedure: DIALYSIS/PERMA CATHETER INSERTION;  Surgeon: Celso College, MD;  Location: [facility];  Service: Cardiovascular;  Laterality: N/A;   DIALYSIS/PERMA CATHETER INSERTION N/A 12/16/2021   Procedure: DIALYSIS/PERMA CATHETER INSERTION;  Surgeon: Jackquelyn Mass, MD;  Location: [facility];  Service:  Cardiovascular;  Laterality: N/A;   DIALYSIS/PERMA CATHETER REMOVAL N/A 09/04/2022   Procedure: DIALYSIS/PERMA CATHETER REMOVAL;  Surgeon: Celso College, MD;  Location: [facility];  Service: Cardiovascular;  Laterality: N/A;   PERIPHERAL VASCULAR THROMBECTOMY Left 09/28/2021   Procedure: PERIPHERAL VASCULAR THROMBECTOMY;  Surgeon: Jackquelyn Mass, MD;  Location: [facility];  Service: Cardiovascular;  Laterality: Left;   REVISON OF ARTERIOVENOUS FISTULA Left 04/28/2022   Procedure: REVISON OF ARTERIOVENOUS FISTULA (BRACHIAL CEPHALIC);  Surgeon: Jackquelyn Mass, MD;  Location: [facility];  Service: Vascular;  Laterality: Left;    Social History Social History   Tobacco Use   Smoking status: Never    Passive exposure: Never   Smokeless tobacco: Never  Vaping Use   Vaping status: Never Used  Substance Use Topics   Alcohol use: No    Alcohol/week: 0.0 standard drinks of alcohol   Drug use: No    Family History Family History  Problem Relation Age of Onset   Diabetes Father    Hypertension Father    Parkinson's disease Father    Hypertension Mother  Rheum arthritis Mother    Atrial fibrillation Mother    Heart failure Mother    Heart attack Mother        died in her mid-70's.   Colon cancer Cousin 54   Breast cancer Neg Hx     Allergies  Allergen Reactions   Spironolactone      Hyperkalemia  Pt unaware of this allergy     REVIEW OF SYSTEMS (Negative unless checked)  Constitutional: [] Weight loss  [] Fever  [] Chills Cardiac: [] Chest pain   [] Chest pressure   [] Palpitations   [] Shortness of breath when laying flat   [] Shortness of breath with exertion. Vascular:  [] Pain in legs with walking   [] Pain in legs at rest  [] History of DVT   [] Phlebitis   [] Swelling in legs   [] Varicose veins   [] Non-healing ulcers Pulmonary:   [] Uses home oxygen    [] Productive cough   [] Hemoptysis   [] Wheeze  [] COPD   [] Asthma Neurologic:  [] Dizziness   [] Seizures    [] History of stroke   [] History of TIA  [] Aphasia   [] Vissual changes   [] Weakness or numbness in arm   [] Weakness or numbness in leg Musculoskeletal:   [] Joint swelling   [] Joint pain   [] Low back pain Hematologic:  [] Easy bruising  [] Easy bleeding   [] Hypercoagulable state   [] Anemic Gastrointestinal:  [] Diarrhea   [] Vomiting  [] Gastroesophageal reflux/heartburn   [] Difficulty swallowing. Genitourinary:  [x] Chronic kidney disease   [] Difficult urination  [] Frequent urination   [] Blood in urine Skin:  [] Rashes   [] Ulcers  Psychological:  [] History of anxiety   []  History of major depression.  Physical Examination  There were no vitals filed for this visit. There is no height or weight on file to calculate BMI. Gen: WD/WN, NAD Head: [place]/AT, No temporalis wasting.  Ear/Nose/Throat: Hearing grossly intact, nares w/o erythema or drainage Eyes: PER, EOMI, sclera nonicteric.  Neck: Supple, no gross masses or lesions.  No JVD.  Pulmonary:  Good air movement, no audible wheezing, no use of accessory muscles.  Cardiac: RRR, precordium non-hyperdynamic. Vascular:   good thrill Vessel Right Left  Radial Palpable Palpable  Brachial Palpable Palpable  Gastrointestinal: soft, non-distended. No guarding/no peritoneal signs.  Musculoskeletal: M/S 5/5 throughout.  No deformity.  Neurologic: CN 2-12 intact. Pain and light touch intact in extremities.  Symmetrical.  Speech is fluent. Motor exam as listed above. Psychiatric: Judgment intact, Mood & affect appropriate for pt's clinical situation. Dermatologic: No rashes or ulcers noted.  No changes consistent with cellulitis.   CBC Lab Results  Component Value Date   WBC 11.3 (H) 09/30/2022   HGB 12.4 09/30/2022   HCT 41.0 09/30/2022   MCV 115.8 (H) 09/30/2022   PLT 156 09/30/2022    BMET    Component Value Date/Time   NA 136 09/30/2022 1055   NA 144 02/27/2020 0931   NA 141 03/09/2014 1533   K 5.0 09/30/2022 1055   K 4.2 03/09/2014 1533    CL 99 09/30/2022 1055   CL 101 03/09/2014 1533   CO2 27 09/30/2022 1055   CO2 30 03/09/2014 1533   GLUCOSE 180 (H) 09/30/2022 1055   GLUCOSE 95 03/09/2014 1533   BUN 50 (H) 09/30/2022 1055   BUN 40 (H) 02/27/2020 0931   BUN 35 (H) 03/09/2014 1533   CREATININE 8.93 (H) 09/30/2022 1055   CREATININE 1.50 (H) 03/09/2014 1533   CALCIUM  8.2 (L) 09/30/2022 1055   CALCIUM  8.8 03/09/2014 1533   GFRNONAA 5 (L) 09/30/2022  1055   GFRNONAA 39 (L) 03/09/2014 1533   GFRNONAA 55 (L) 07/31/2013 1521   GFRAA 35 (L) 02/27/2020 0931   GFRAA 47 (L) 03/09/2014 1533   GFRAA >60 07/31/2013 1521   CrCl cannot be calculated (Patient's most recent lab result is older than the maximum 21 days allowed.).  COAG Lab Results  Component Value Date   INR 1.1 02/15/2021   INR 1.35 01/12/2018   INR 1.27 11/16/2017    Radiology No results found.   Assessment/Plan 1. ESRD on hemodialysis (HCC) (Primary) Recommend:  The patient is doing well and currently has adequate dialysis access. The patient's dialysis center is not reporting any access issues. Flow pattern is stable when compared to the prior ultrasound.  The patient should have a duplex ultrasound of the dialysis access in 3 months given the relatively low volume flow noted.  This is all relative to the fact that she is not having issues while on dialysis.  The patient will follow-up with me in the office after each ultrasound   - VAS US  DUPLEX DIALYSIS ACCESS (AVF, AVG); Future  2. Essential hypertension Continue antihypertensive medications as already ordered, these medications have been reviewed and there are no changes at this time.  3. Atrial fibrillation, chronic (HCC) Continue antiarrhythmia medications as already ordered, these medications have been reviewed and there are no changes at this time.  Continue anticoagulation as ordered by Cardiology Service  4. Type 2 diabetes mellitus with obesity (HCC) Continue hypoglycemic  medications as already ordered, these medications have been reviewed and there are no changes at this time.  Hgb A1C to be monitored as already arranged by primary service    [name], MD  03/22/2023 12:55 PM

## 2023-03-26 ENCOUNTER — Encounter (INDEPENDENT_AMBULATORY_CARE_PROVIDER_SITE_OTHER): Payer: Self-pay | Admitting: Vascular Surgery

## 2023-03-26 ENCOUNTER — Ambulatory Visit (INDEPENDENT_AMBULATORY_CARE_PROVIDER_SITE_OTHER): Payer: BC Managed Care – PPO | Admitting: Vascular Surgery

## 2023-03-26 ENCOUNTER — Ambulatory Visit (INDEPENDENT_AMBULATORY_CARE_PROVIDER_SITE_OTHER): Payer: BC Managed Care – PPO

## 2023-03-26 VITALS — BP 93/58 | HR 89 | Resp 18 | Ht 61.0 in | Wt 356.8 lb

## 2023-03-26 DIAGNOSIS — E1169 Type 2 diabetes mellitus with other specified complication: Secondary | ICD-10-CM | POA: Diagnosis not present

## 2023-03-26 DIAGNOSIS — I10 Essential (primary) hypertension: Secondary | ICD-10-CM

## 2023-03-26 DIAGNOSIS — Z992 Dependence on renal dialysis: Secondary | ICD-10-CM

## 2023-03-26 DIAGNOSIS — E669 Obesity, unspecified: Secondary | ICD-10-CM

## 2023-03-26 DIAGNOSIS — I482 Chronic atrial fibrillation, unspecified: Secondary | ICD-10-CM | POA: Diagnosis not present

## 2023-03-26 DIAGNOSIS — N186 End stage renal disease: Secondary | ICD-10-CM

## 2023-04-11 ENCOUNTER — Other Ambulatory Visit: Payer: Self-pay | Admitting: *Deleted

## 2023-04-11 ENCOUNTER — Telehealth: Payer: Self-pay | Admitting: *Deleted

## 2023-04-11 DIAGNOSIS — I2782 Chronic pulmonary embolism: Secondary | ICD-10-CM

## 2023-04-11 MED ORDER — APIXABAN 5 MG PO TABS: 5.0000 mg | ORAL_TABLET | Freq: Two times a day (BID) | ORAL | 1 refills | Status: DC

## 2023-04-11 NOTE — Telephone Encounter (Signed)
 Refill of elquis, md will sign [facility] today to [facility]. Called the pt to tell her that we put it in and Smith Robert will sign it today. Pt. ok

## 2023-04-11 NOTE — Progress Notes (Signed)
 Pt called to get refill for the eliquis, sent the med for MD to signed.

## 2023-04-18 DIAGNOSIS — E118 Type 2 diabetes mellitus with unspecified complications: Secondary | ICD-10-CM | POA: Diagnosis not present

## 2023-04-18 DIAGNOSIS — Z992 Dependence on renal dialysis: Secondary | ICD-10-CM | POA: Diagnosis not present

## 2023-04-18 DIAGNOSIS — N186 End stage renal disease: Secondary | ICD-10-CM | POA: Diagnosis not present

## 2023-04-30 ENCOUNTER — Ambulatory Visit
Admission: RE | Admit: 2023-04-30 | Discharge: 2023-04-30 | Disposition: A | Discharge status: Home / Self Care | Source: Ambulatory Visit | Attending: Emergency Medicine | Admitting: Emergency Medicine

## 2023-04-30 VITALS — BP 116/68 | HR 97 | Temp 97.7°F | Resp 18

## 2023-04-30 DIAGNOSIS — L02415 Cutaneous abscess of right lower limb: Secondary | ICD-10-CM

## 2023-04-30 DIAGNOSIS — L02411 Cutaneous abscess of right axilla: Secondary | ICD-10-CM

## 2023-04-30 MED ORDER — DOXYCYCLINE HYCLATE 100 MG PO CAPS: 100.0000 mg | ORAL_CAPSULE | Freq: Two times a day (BID) | ORAL | 0 refills | Status: AC

## 2023-04-30 NOTE — ED Provider Notes (Signed)
 Nicole Schmidt    CSN: [id number] Arrival date & time: 04/30/23  1354      History   Chief Complaint Chief Complaint  Patient presents with   Wound Check    I think I have a pressure sore at bottom of my buttocks, top of my right leg. - Entered by patient    HPI Nicole Schmidt is a 58 y.o. female.  Patient presents with a painful sore on her right upper posterior thigh x 2 to 3 weeks.  The area has developed a superficial wound but no drainage noted.  No fever or chills.  She also presents with a small painful abscess in her right axilla x 1 month.  No drainage.  Patient has a history of groin abscess and left buttock pressure ulcer.  The history is provided by the patient and medical records.    Past Medical History:  Diagnosis Date   (HFpEF) heart failure with preserved ejection fraction (HCC)    a. 10/2018 Echo: EF 60-65%, diast dysfxn, RVSP 50.7 mmHg; b. 01/2020 Echo: EF 65-70%, G2DD; c. 01/2021 Echo: EF 60-65%, no rwma, mod LVH. Nl RV fxn, mildly enlarged RV. Mod BAE. Triv MR. d. 05/2021 Echo: EF 55-60; no RWMAs, mild LVH, mild TR.   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 Er/PR pos, her 2 negative.  Margins 1 mm.   COPD (chronic obstructive pulmonary disease) (HCC)    Diabetes mellitus type 2 in obese 02/09/2020   Difficult intubation 09/28/2021   a.) mallampati III-IV; BMI >55 kg/m   Diverticulosis    Dyslipidemia 02/09/2020   ESBL (extended spectrum beta-lactamase) producing bacteria infection 03/20/2021   a. urine culture (+) for ESBL producing E.coli and P. mirabilis   ESRD (end stage renal disease) on dialysis (HCC)    a. T-Th-Sat HD initiated 02/2021   Essential hypertension    Gout    History of kidney stones    Long term current use of  anticoagulant    a.) apixaban   Menopause    age 76   Morbid obesity (HCC)    OSA treated with BiPAP    a. on nocturnal PAP therapy; Trilogy 100 ventilator   Permanent atrial fibrillation (HCC)    a. Dx 10/2018 in setting of PNA. b. CHA2DS2-VASc = 7 (sex, HFpEF, HTN, PE x2, aortic plaque, T2DM); c. s/p successful DCCV 11/2018. d. subsequent recurrent Afib; rate/rhythm maintained on oral metoprolol succinate; chronically anticoagulated using standard dose apixaban.   Personal history of radiation therapy    Pulmonary embolism (HCC) 10/2014   a. on chronic apixaban   Restrictive lung disease    Shingles 10/24/2021   Sleep difficulties    a.) takes melatonin   Thyroid goiter     Patient Active Problem List   Diagnosis Date Noted   Aortic atherosclerosis (HCC) 07/13/2021   Restrictive lung disease 06/07/2021   OSA (obstructive sleep apnea)    Hypoxia    Acute blood loss anemia    Super obese    Controlled type 2 diabetes mellitus with hyperglycemia, without long-term current use of insulin (HCC)    ESRD on hemodialysis (HCC)    Acute respiratory failure with hypoxia and hypercapnia (HCC)    Ambulatory dysfunction  Pressure injury of skin 03/21/2021   Debility 03/04/2021   ESRD (end stage renal disease) (HCC) 03/02/2021   Acute on chronic renal failure (HCC) 02/24/2021   Viral URI    Multifocal pneumonia 02/19/2021   Hydronephrosis with renal and ureteral calculus obstruction 02/19/2021   Hyperkalemia 02/19/2021   Cardiorenal syndrome, stage 1-4 or unspecified chronic kidney disease, with heart failure (HCC) 02/09/2021    Class: Hospitalized for   AKI (acute kidney injury) (HCC) 02/09/2021   Bronchitis 02/09/2021   Cardiorenal syndrome with renal failure 02/09/2021   Chronic respiratory failure with hypoxia (HCC) 01/13/2021   Physical deconditioning 03/16/2020   Abnormal findings on diagnostic imaging of lung 03/16/2020   AF (paroxysmal atrial fibrillation) (HCC)     Acquired thrombophilia (HCC)    Atrial fibrillation, chronic (HCC) 02/09/2020   Dyslipidemia 02/09/2020   Type 2 diabetes mellitus with obesity (HCC) 02/09/2020   Hypothyroidism 02/09/2020   Thrush, oral 02/09/2020   Morbid obesity with BMI of 60.0-69.9, adult (HCC) 02/03/2020   Chronic anticoagulation 12/30/2019   Obstructive sleep apnea 08/04/2019   Edema of lower extremity 01/16/2019   Acute kidney failure (HCC) 01/16/2019   Stage 3 chronic kidney disease (HCC) 01/16/2019   Benign essential hypertension 01/16/2019   Atrial fibrillation with RVR (HCC) 10/18/2018   Acute on chronic diastolic CHF (congestive heart failure) (HCC) 02/04/2018   Lymphedema 02/04/2018   Essential hypertension 12/09/2017   Long term (current) use of aromatase inhibitors 11/17/2017   Elevated alkaline phosphatase level 05/20/2017   Goals of care, counseling/discussion 05/14/2017   Chronic renal disease, stage III (HCC) 07/08/2015   Iron deficiency anemia 07/08/2015   Acute renal failure superimposed on stage 4 chronic kidney disease (HCC) 10/22/2014   History of breast cancer 10/22/2014   Elevated troponin 10/22/2014   Pulmonary emboli (HCC) 10/18/2014   History of pulmonary embolus (PE) 10/18/2014   Candidiasis of breast 07/06/2014   Malignant neoplasm of right female breast (HCC) 07/24/2012    Past Surgical History:  Procedure Laterality Date   A/V FISTULAGRAM Left 09/02/2021   Procedure: A/V Fistulagram;  Surgeon: Renford Dills, MD;  Location: [facility];  Service: Cardiovascular;  Laterality: Left;   A/V FISTULAGRAM Left 06/27/2022   Procedure: A/V Fistulagram;  Surgeon: Renford Dills, MD;  Location: [facility];  Service: Cardiovascular;  Laterality: Left;   A/V FISTULAGRAM Left 01/05/2023   Procedure: A/V Fistulagram;  Surgeon: Renford Dills, MD;  Location: [facility];  Service: Cardiovascular;  Laterality: Left;   AV FISTULA PLACEMENT Left 07/22/2021    Procedure: ARTERIOVENOUS (AV) FISTULA CREATION (RADIALCEPHALIC);  Surgeon: Renford Dills, MD;  Location: [facility];  Service: Vascular;  Laterality: Left;   AV FISTULA PLACEMENT Left 12/09/2021   Procedure: ARTERIOVENOUS (AV) FISTULA CREATION (BRACHIAL CEPHALIC);  Surgeon: Renford Dills, MD;  Location: [facility];  Service: Vascular;  Laterality: Left;   BREAST BIOPSY Right 2014   breast ca   BREAST EXCISIONAL BIOPSY Right 07/19/2012   breast ca   BREAST SURGERY Right 2014   with sentinel node bx subareaolar duct excision   CARDIOVERSION N/A 11/28/2018   Procedure: CARDIOVERSION;  Surgeon: Antonieta Iba, MD;  Location: [facility];  Service: Cardiovascular;  Laterality: N/A;   COLONOSCOPY WITH PROPOFOL N/A 01/30/2017   Procedure: COLONOSCOPY WITH PROPOFOL;  Surgeon: Kieth Brightly, MD;  Location: [facility];  Service: Endoscopy;  Laterality: N/A;   CYSTOSCOPY W/ RETROGRADES  06/17/2021   Procedure: CYSTOSCOPY WITH  RETROGRADE PYELOGRAM;  Surgeon: Sondra Come, MD;  Location: [facility];  Service: Urology;;   CYSTOSCOPY W/ URETERAL STENT PLACEMENT Left 02/14/2021   Procedure: CYSTOSCOPY WITH RETROGRADE PYELOGRAM/URETERAL STENT PLACEMENT;  Surgeon: Sondra Come, MD;  Location: [facility];  Service: Urology;  Laterality: Left;   CYSTOSCOPY/URETEROSCOPY/HOLMIUM LASER/STENT PLACEMENT Left 06/17/2021   Procedure: CYSTOSCOPY/URETEROSCOPY/HOLMIUM LASER/STENT EXCHANGE;  Surgeon: Sondra Come, MD;  Location: [facility];  Service: Urology;  Laterality: Left;   DIALYSIS/PERMA CATHETER INSERTION N/A 02/24/2021   Procedure: DIALYSIS/PERMA CATHETER INSERTION;  Surgeon: Annice Needy, MD;  Location: [facility];  Service: Cardiovascular;  Laterality: N/A;   DIALYSIS/PERMA CATHETER INSERTION N/A 12/16/2021   Procedure: DIALYSIS/PERMA CATHETER INSERTION;  Surgeon: Renford Dills, MD;  Location: [facility];  Service: Cardiovascular;  Laterality: N/A;   DIALYSIS/PERMA  CATHETER REMOVAL N/A 09/04/2022   Procedure: DIALYSIS/PERMA CATHETER REMOVAL;  Surgeon: Annice Needy, MD;  Location: [facility];  Service: Cardiovascular;  Laterality: N/A;   PERIPHERAL VASCULAR THROMBECTOMY Left 09/28/2021   Procedure: PERIPHERAL VASCULAR THROMBECTOMY;  Surgeon: Renford Dills, MD;  Location: [facility];  Service: Cardiovascular;  Laterality: Left;   REVISON OF ARTERIOVENOUS FISTULA Left 04/28/2022   Procedure: REVISON OF ARTERIOVENOUS FISTULA (BRACHIAL CEPHALIC);  Surgeon: Renford Dills, MD;  Location: [facility];  Service: Vascular;  Laterality: Left;    OB History     Gravida  0   Para      Term      Preterm      AB      Living         SAB      IAB      Ectopic      Multiple      Live Births           Obstetric Comments  1st Menstrual Cycle:  11          Home Medications    Prior to Admission medications   Medication Sig Start Date End Date Taking? Authorizing Provider  doxycycline (VIBRAMYCIN) 100 MG capsule Take 1 capsule (100 mg total) by mouth 2 (two) times daily for 7 days. 04/30/23 05/07/23 Yes Mickie Bail, NP  acetaminophen (TYLENOL) 325 MG tablet Take 2 tablets (650 mg total) by mouth every 6 (six) hours as needed for mild pain (or Fever >/= 101). 03/15/21   Angiulli, Mcarthur Rossetti, PA-C  albuterol (VENTOLIN HFA) 108 (90 Base) MCG/ACT inhaler Inhale 2 puffs into the lungs every 6 (six) hours as needed for wheezing or shortness of breath. 04/12/21   Angiulli, Mcarthur Rossetti, PA-C  apixaban (ELIQUIS) 5 MG TABS tablet Take 1 tablet (5 mg total) by mouth 2 (two) times daily. 04/11/23   Creig Hines, MD  aspirin EC 81 MG tablet Take 1 tablet (81 mg total) by mouth daily. Swallow whole. 01/05/23 01/05/24  Schnier, Latina Craver, MD  atorvastatin (LIPITOR) 40 MG tablet Take 40 mg by mouth daily.    [provider]  calcium acetate (PHOSLO) 667 MG capsule Take 667 mg by mouth 3 (three) times daily. 11/09/22   [provider]  Cholecalciferol (VITAMIN D3) 1.25 MG (50000 UT) CAPS Take 1 capsule by mouth once a week. 07/04/22   [provider]  cinacalcet (SENSIPAR) 30 MG tablet Take 30 mg by mouth daily. 11/26/22   [provider]  ciprofloxacin (CIPRO) 250 MG tablet Take 1 tablet (250 mg total) by mouth 2 (two)  times daily. Patient not taking: Reported on 01/05/2023 10/02/22   Georgiana Spinner, NP  cloNIDine (CATAPRES) 0.1 MG tablet Take 1 tablet (0.1 mg total) by mouth 2 (two) times daily. 07/26/22   Iran Ouch, MD  CRANBERRY PO Take 1 capsule by mouth daily.    [provider]  ergocalciferol (VITAMIN D2) 1.25 MG (50000 UT) capsule Take 1 capsule (50,000 Units total) by mouth once a week. Patient not taking: Reported on 10/13/2022 04/12/21   Angiulli, Mcarthur Rossetti, PA-C  ezetimibe (ZETIA) 10 MG tablet Take 10 mg by mouth daily with supper.    [provider]  ferric gluconate 250 mg in sodium chloride 0.9 % 250 mL Inject 250 mg into the vein Every Tuesday,Thursday,and Saturday with dialysis. 04/12/21   Angiulli, Mcarthur Rossetti, PA-C  fluticasone-salmeterol (ADVAIR DISKUS) 100-50 MCG/ACT AEPB Inhale 1 puff into the lungs 2 (two) times daily. 11/24/22   Glenford Bayley, NP  folic acid (FOLVITE) 1 MG tablet Take 1 tablet (1 mg total) by mouth daily. 04/12/21   Angiulli, Mcarthur Rossetti, PA-C  gabapentin (NEURONTIN) 300 MG capsule Take 1 capsule (300 mg total) by mouth at bedtime. 04/12/21   Angiulli, Mcarthur Rossetti, PA-C  HYDROcodone-acetaminophen (NORCO) 5-325 MG tablet Take 1 tablet by mouth every 6 (six) hours as needed for moderate pain. 04/28/22   Schnier, Latina Craver, MD  Lancets [facility] DELICA PLUS [place]) MISC  05/17/21   [provider]  levothyroxine (SYNTHROID) 112 MCG tablet Take 1 tablet (112 mcg total) by mouth daily before breakfast. 04/12/21   Angiulli, Mcarthur Rossetti, PA-C  melatonin 5 MG TABS Take 2 tablets (10 mg total) by mouth at bedtime as needed (sleep). 04/12/21    Angiulli, Mcarthur Rossetti, PA-C  Melatonin-Pyridoxine 5-1 MG TABS Take by mouth. 04/12/21   [provider]  metoprolol succinate (TOPROL-XL) 25 MG 24 hr tablet Take 1 tablet (25 mg total) by mouth at bedtime. Take with or immediately following a meal. 07/26/22   Iran Ouch, MD  midodrine (PROAMATINE) 10 MG tablet Take 1 tablet (10 mg total) by mouth Every Tuesday,Thursday,and Saturday with dialysis. 04/12/21   Angiulli, Mcarthur Rossetti, PA-C  multivitamin (RENA-VIT) TABS tablet Take 1 tablet by mouth daily. 01/30/23   [provider]  [facility] ULTRA test strip  05/17/21   [provider]  polyethylene glycol powder (GLYCOLAX/MIRALAX) 17 GM/SCOOP powder Take 1 Container by mouth once.    [provider]  SSD 1 % cream Apply topically 2 (two) times daily. 11/29/22   [provider]  traMADol (ULTRAM) 50 MG tablet Take 1 tablet (50 mg total) by mouth every 12 (twelve) hours as needed for moderate pain. 04/12/21   Angiulli, Mcarthur Rossetti, PA-C  TRULICITY 1.5 MG/0.5ML SOPN Inject 1.5 mg into the skin every Monday. 05/17/21   [provider]  VELPHORO 500 MG chewable tablet Chew 1,000 mg by mouth 3 (three) times daily. 02/07/22   [provider]    Family History Family History  Problem Relation Age of Onset   Diabetes Father    Hypertension Father    Parkinson's disease Father    Hypertension Mother    Rheum arthritis Mother    Atrial fibrillation Mother    Heart failure Mother    Heart attack Mother        died in her mid-70's.   Colon cancer Cousin 57   Breast cancer Neg Hx     Social History Social History   Tobacco Use  Smoking status: Never    Passive exposure: Never   Smokeless tobacco: Never  Vaping Use   Vaping status: Never Used  Substance Use Topics   Alcohol use: No    Alcohol/week: 0.0 standard drinks of alcohol   Drug use: No     Allergies   Spironolactone   Review of Systems Review of Systems  Constitutional:   Negative for chills and fever.  Skin:  Positive for color change and wound.     Physical Exam Triage Vital Signs ED Triage Vitals  Encounter Vitals Group     BP      Systolic BP Percentile      Diastolic BP Percentile      Pulse      Resp      Temp      Temp src      SpO2      Weight      Height      Head Circumference      Peak Flow      Pain Score      Pain Loc      Pain Education      Exclude from Growth Chart    No data found.  Updated Vital Signs BP 116/68   Pulse 97   Temp 97.7 F (36.5 C)   Resp 18   LMP 07/17/2012   SpO2 95%   Visual Acuity Right Eye Distance:   Left Eye Distance:   Bilateral Distance:    Right Eye Near:   Left Eye Near:    Bilateral Near:     Physical Exam Constitutional:      General: She is not in acute distress. HENT:     Mouth/Throat:     Mouth: Mucous membranes are moist.  Cardiovascular:     Rate and Rhythm: Normal rate and regular rhythm.  Pulmonary:     Effort: Pulmonary effort is normal. No respiratory distress.  Skin:    General: Skin is warm and dry.     Findings: Lesion present.     Comments: Right axilla: 2 cm x 1 cm area of induration with moderate purulent drainage.  Right posterior upper thigh just below buttock: approximately 7 cm x 6 cm area of firm non-fluctuant induration with central open superficial wound. No drainage.   Neurological:     Mental Status: She is alert.      UC Treatments / Results  Labs (all labs ordered are listed, but only abnormal results are displayed) Labs Reviewed - No data to display  EKG   Radiology No results found.  Procedures Procedures (including critical care time)  Medications Ordered in UC Medications - No data to display  Initial Impression / Assessment and Plan / UC Course  I have reviewed the triage vital signs and the nursing notes.  Pertinent labs & imaging results that were available during my care of the patient were reviewed by me and considered  in my medical decision making (see chart for details).    Abscess of right axilla, Abscess of right posterior thigh.  No I&D indicated at this time.  The axillary abscess is open and draining.  The posterior thigh abscess is firm and nonfluctuant with a superficial wound.  Treating today with doxycycline.  Discussed warm compresses.  Instructed patient to follow-up with her PCP or a dermatologist.  She has both and will schedule an appointment as soon as possible.  Education provided on skin abscess.  Patient agrees to plan  of care.  Final Clinical Impressions(s) / UC Diagnoses   Final diagnoses:  Abscess of axilla, right  Abscess of right thigh     Discharge Instructions      Take the doxycycline as directed.  Apply warm compresses as directed.  Follow up with your dermatologist.      ED Prescriptions     Medication Sig Dispense Auth. Provider   doxycycline (VIBRAMYCIN) 100 MG capsule Take 1 capsule (100 mg total) by mouth 2 (two) times daily for 7 days. 14 capsule Mickie Bail, NP      PDMP not reviewed this encounter.   Mickie Bail, NP 04/30/23 [phone]

## 2023-04-30 NOTE — ED Triage Notes (Signed)
 Patient to Urgent Care with complaints of a possible pressure sore present to bottom of her right buttock. Symptoms started 2-3 weeks ago. No drainage. Pain worse w/ pressure.   Also presents with a possible ingrown hair in her right armpit. No drainage. Symptoms x1 month.

## 2023-04-30 NOTE — Discharge Instructions (Addendum)
 Take the doxycycline as directed.  Apply warm compresses as directed.  Follow up with your dermatologist.

## 2023-05-03 DIAGNOSIS — L02415 Cutaneous abscess of right lower limb: Secondary | ICD-10-CM | POA: Diagnosis not present

## 2023-05-10 DIAGNOSIS — L02415 Cutaneous abscess of right lower limb: Secondary | ICD-10-CM | POA: Diagnosis not present

## 2023-05-17 DIAGNOSIS — L02415 Cutaneous abscess of right lower limb: Secondary | ICD-10-CM | POA: Diagnosis not present

## 2023-05-25 ENCOUNTER — Ambulatory Visit (INDEPENDENT_AMBULATORY_CARE_PROVIDER_SITE_OTHER): Payer: BC Managed Care – PPO | Admitting: Nurse Practitioner

## 2023-05-25 VITALS — BP 110/60 | HR 103 | Temp 98.7°F | Ht 61.0 in | Wt 356.8 lb

## 2023-05-25 DIAGNOSIS — N186 End stage renal disease: Secondary | ICD-10-CM | POA: Diagnosis not present

## 2023-05-25 DIAGNOSIS — Z6841 Body Mass Index (BMI) 40.0 and over, adult: Secondary | ICD-10-CM

## 2023-05-25 DIAGNOSIS — G4733 Obstructive sleep apnea (adult) (pediatric): Secondary | ICD-10-CM | POA: Diagnosis not present

## 2023-05-25 DIAGNOSIS — J984 Other disorders of lung: Secondary | ICD-10-CM | POA: Diagnosis not present

## 2023-05-25 DIAGNOSIS — Z992 Dependence on renal dialysis: Secondary | ICD-10-CM

## 2023-05-25 MED ORDER — ALBUTEROL SULFATE HFA 108 (90 BASE) MCG/ACT IN AERS: 2.0000 | INHALATION_SPRAY | Freq: Four times a day (QID) | RESPIRATORY_TRACT | 2 refills | Status: AC | PRN

## 2023-05-25 NOTE — Progress Notes (Signed)
 @Patient  ID: Nicole Schmidt, female    DOB: [birth year], 58 y.o.   MRN: [id number]  Chief Complaint  Patient presents with   Follow-up    No concerns    Referring provider: Eartha Gold, MD  HPI: 58 year old female, never smoker followed for OSA/OHS and chronic respiratory failure on NIV, restrictive lung disease. She is a former patient of Dr. Carlyle Childes and last seen in office 10/13/2022 by Sueanne Emerald, NP. Past medical history significant for CHF, ESRD on HD, hx of PE,   TEST/EVENTS:  04/26/2019 HST: AHI 17.1, SpO2 low 54% 03/22/2021 ABG pCO2 79 08/12/2021 PFT: FVC 55, FEV1 60, ratio 84, TLC 85, DLCO 78  10/13/2022: Ov with Sueanne Emerald, NP. 6 month f/u OHS/OSA and restrictive lung disease. 80% compliant with NIV. Average use 4 hr 1 min. Using FFM. DOE. Feels may have done better when using Advair. Not currently on BD regimen. Denies cough, wheezing, chest tightness. Not convinced she has true asthma but reports benefit so will re-trial Advair.   05/25/2023: Today - follow up Patient presents today for follow up. She has been doing well from a breathing standpoint since her last visit. She's not had any episodes requiring steroids or abx. No hospitalizations for her breathing. She uses advair twice daily. Doesn't have a updated rescue inhaler but doesn't ever feel like she needs one. She would like to have one on hand. No cough or wheezing. She wears her NIV most nights. She does sleep better and wake up better energized when wearing it. She denies any drowsy driving or morning headaches. No issues with oxygen levels at home.   04/26/2023-05/25/2023: NIV download 27/30 days; 77% >4 hr; average use 6 hr 13 min Leaks 4.7 AHI 0.4  Allergies  Allergen Reactions   Spironolactone     Hyperkalemia  Pt unaware of this allergy    Immunization History  Administered Date(s) Administered   Influenza-Unspecified 11/05/2018, 12/03/2021   PFIZER Comirnaty(Gray Top)Covid-19 Tri-Sucrose Vaccine 03/25/2022    PFIZER(Purple Top)SARS-COV-2 Vaccination 05/19/2019, 06/16/2019, 02/18/2020   Pfizer Covid-19 Vaccine Bivalent Booster 15yrs & up 11/22/2020   Pneumococcal Polysaccharide-23 02/16/2020    Past Medical History:  Diagnosis Date   (HFpEF) heart failure with preserved ejection fraction (HCC)    a. 10/2018 Echo: EF 60-65%, diast dysfxn, RVSP 50.7 mmHg; b. 01/2020 Echo: EF 65-70%, G2DD; c. 01/2021 Echo: EF 60-65%, no rwma, mod LVH. Nl RV fxn, mildly enlarged RV. Mod BAE. Triv MR. d. 05/2021 Echo: EF 55-60; no RWMAs, mild LVH, mild TR.   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 Er/PR pos, her 2 negative.  Margins 1 mm.   COPD (chronic obstructive pulmonary disease) (HCC)    Diabetes mellitus type 2 in obese 02/09/2020   Difficult intubation 09/28/2021   a.) mallampati III-IV; BMI >55 kg/m   Diverticulosis    Dyslipidemia 02/09/2020   ESBL (extended spectrum beta-lactamase) producing bacteria infection 03/20/2021   a. urine culture (+) for ESBL producing E.coli and P. mirabilis   ESRD (end stage renal disease) on dialysis (HCC)    a. T-Th-Sat HD initiated 02/2021   Essential hypertension    Gout    History of kidney stones    Long term current use of anticoagulant  a.) apixaban   Menopause    age 42   Morbid obesity (HCC)    OSA treated with BiPAP    a. on nocturnal PAP therapy; Trilogy 100 ventilator   Permanent atrial fibrillation (HCC)    a. Dx 10/2018 in setting of PNA. b. CHA2DS2-VASc = 7 (sex, HFpEF, HTN, PE x2, aortic plaque, T2DM); c. s/p successful DCCV 11/2018. d. subsequent recurrent Afib; rate/rhythm maintained on oral metoprolol succinate; chronically anticoagulated using standard dose apixaban.   Personal history of radiation therapy    Pulmonary embolism (HCC)  10/2014   a. on chronic apixaban   Restrictive lung disease    Shingles 10/24/2021   Sleep difficulties    a.) takes melatonin   Thyroid goiter     Tobacco History: Social History   Tobacco Use  Smoking Status Never   Passive exposure: Never  Smokeless Tobacco Never   Counseling given: Not Answered   Outpatient Medications Prior to Visit  Medication Sig Dispense Refill   acetaminophen (TYLENOL) 325 MG tablet Take 2 tablets (650 mg total) by mouth every 6 (six) hours as needed for mild pain (or Fever >/= 101).     apixaban (ELIQUIS) 5 MG TABS tablet Take 1 tablet (5 mg total) by mouth 2 (two) times daily. 180 tablet 1   aspirin EC 81 MG tablet Take 1 tablet (81 mg total) by mouth daily. Swallow whole. 150 tablet 1   atorvastatin (LIPITOR) 40 MG tablet Take 40 mg by mouth daily.     calcium acetate (PHOSLO) 667 MG capsule Take 667 mg by mouth 3 (three) times daily.     cinacalcet (SENSIPAR) 30 MG tablet Take 30 mg by mouth daily.     cloNIDine (CATAPRES) 0.1 MG tablet Take 1 tablet (0.1 mg total) by mouth 2 (two) times daily. 180 tablet 2   CRANBERRY PO Take 1 capsule by mouth daily.     ezetimibe (ZETIA) 10 MG tablet Take 10 mg by mouth daily with supper.     ferric gluconate 250 mg in sodium chloride 0.9 % 250 mL Inject 250 mg into the vein Every Tuesday,Thursday,and Saturday with dialysis.     fluticasone-salmeterol (ADVAIR DISKUS) 100-50 MCG/ACT AEPB Inhale 1 puff into the lungs 2 (two) times daily. 60 each 6   gabapentin (NEURONTIN) 300 MG capsule Take 1 capsule (300 mg total) by mouth at bedtime. 30 capsule 0   Lancets (ONETOUCH DELICA PLUS LANCET30G) MISC      levothyroxine (SYNTHROID) 112 MCG tablet Take 1 tablet (112 mcg total) by mouth daily before breakfast. 30 tablet 0   melatonin 5 MG TABS Take 2 tablets (10 mg total) by mouth at bedtime as needed (sleep). 60 tablet 0   metoprolol succinate (TOPROL-XL) 25 MG 24 hr tablet Take 1 tablet (25 mg total) by mouth at bedtime.  Take with or immediately following a meal. 90 tablet 3   midodrine (PROAMATINE) 10 MG tablet Take 1 tablet (10 mg total) by mouth Every Tuesday,Thursday,and Saturday with dialysis. 30 tablet 0   multivitamin (RENA-VIT) TABS tablet Take 1 tablet by mouth daily.     ONETOUCH ULTRA test strip      TRULICITY 1.5 MG/0.5ML SOPN Inject 1.5 mg into the skin every Monday.     VELPHORO 500 MG chewable tablet Chew 1,000 mg by mouth 3 (three) times daily.     polyethylene glycol powder (GLYCOLAX/MIRALAX) 17 GM/SCOOP powder Take 1 Container by mouth once. (Patient not taking: Reported on 05/25/2023)  albuterol (VENTOLIN HFA) 108 (90 Base) MCG/ACT inhaler Inhale 2 puffs into the lungs every 6 (six) hours as needed for wheezing or shortness of breath. 8.5 g 0   Cholecalciferol (VITAMIN D3) 1.25 MG (50000 UT) CAPS Take 1 capsule by mouth once a week.     ciprofloxacin (CIPRO) 250 MG tablet Take 1 tablet (250 mg total) by mouth 2 (two) times daily. (Patient not taking: Reported on 01/05/2023) 14 tablet 0   ergocalciferol (VITAMIN D2) 1.25 MG (50000 UT) capsule Take 1 capsule (50,000 Units total) by mouth once a week. (Patient not taking: Reported on 10/13/2022) 5 capsule 0   folic acid (FOLVITE) 1 MG tablet Take 1 tablet (1 mg total) by mouth daily. 30 tablet 0   HYDROcodone-acetaminophen (NORCO) 5-325 MG tablet Take 1 tablet by mouth every 6 (six) hours as needed for moderate pain. (Patient not taking: Reported on 05/25/2023) 30 tablet 0   Melatonin-Pyridoxine 5-1 MG TABS Take by mouth. (Patient not taking: Reported on 05/25/2023)     SSD 1 % cream Apply topically 2 (two) times daily. (Patient not taking: Reported on 05/25/2023)     traMADol (ULTRAM) 50 MG tablet Take 1 tablet (50 mg total) by mouth every 12 (twelve) hours as needed for moderate pain. (Patient not taking: Reported on 05/25/2023) 30 tablet 0   No facility-administered medications prior to visit.     Review of Systems:   Constitutional: No weight  loss or gain, night sweats, fevers, chills, fatigue, or lassitude. HEENT: No headaches, difficulty swallowing, tooth/dental problems, or sore throat. No sneezing, itching, ear ache, nasal congestion, or post nasal drip CV:  No chest pain, orthopnea, PND, swelling in lower extremities, anasarca, dizziness, palpitations, syncope Resp: +minimal shortness of breath with exertion. No excess mucus or change in color of mucus. No productive or non-productive. No hemoptysis. No wheezing.  No chest wall deformity GI:  No heartburn, indigestion MSK:  No joint pain or swelling.   Neuro: No dizziness or lightheadedness.  Psych: No depression or anxiety. Mood stable.     Physical Exam:  BP 110/60 (BP Location: Right Wrist, Patient Position: Sitting, Cuff Size: Normal)   Pulse (!) 103   Temp 98.7 F (37.1 C) (Oral)   Ht 5\' 1"  (1.549 m)   Wt (!) 356 lb 12.8 oz (161.8 kg)   LMP 07/17/2012   SpO2 98%   BMI 67.42 kg/m   GEN: Pleasant, interactive, well-kempt; morbidly obese; in no acute distress HEENT:  Normocephalic and atraumatic. PERRLA. Sclera white. Nasal turbinates pink, moist and patent bilaterally. No rhinorrhea present. Oropharynx pink and moist, without exudate or edema. No lesions, ulcerations, or postnasal drip.  NECK:  Supple w/ fair ROM. No JVD present. Thyroid symmetrical with no goiter or nodules palpated. No lymphadenopathy.   CV: Irregular rhythm, rate controlled, no m/r/g, no peripheral edema. Pulses intact, +2 bilaterally. No cyanosis, pallor or clubbing. PULMONARY:  Unlabored, regular breathing. Clear bilaterally A&P w/o wheezes/rales/rhonchi. No accessory muscle use.  GI: BS present and normoactive. Soft, non-tender to palpation. No organomegaly or masses detected. MSK: No erythema, warmth or tenderness. Cap refil <2 sec all extrem. Neuro: A/Ox3. No focal deficits noted.   Skin: Warm, no lesions or rashe Psych: Normal affect and behavior. Judgement and thought content  appropriate.     Lab Results:  CBC    Component Value Date/Time   WBC 11.3 (H) 09/30/2022 1055   RBC 3.54 (L) 09/30/2022 1055   HGB 12.4 09/30/2022 1055   HGB  9.9 (L) 02/27/2020 0931   HCT 41.0 09/30/2022 1055   HCT 32.2 (L) 02/27/2020 0931   PLT 156 09/30/2022 1055   PLT 232 02/27/2020 0931   MCV 115.8 (H) 09/30/2022 1055   MCV 89 02/27/2020 0931   MCV 90 03/09/2014 1533   MCH 35.0 (H) 09/30/2022 1055   MCHC 30.2 09/30/2022 1055   RDW 16.0 (H) 09/30/2022 1055   RDW 13.0 02/27/2020 0931   RDW 15.2 (H) 03/09/2014 1533   LYMPHSABS 1.8 04/21/2022 1109   LYMPHSABS 1.5 03/09/2014 1533   MONOABS 0.8 04/21/2022 1109   MONOABS 0.6 03/09/2014 1533   EOSABS 0.2 04/21/2022 1109   EOSABS 0.2 03/09/2014 1533   BASOSABS 0.0 04/21/2022 1109   BASOSABS 0.1 03/09/2014 1533    BMET    Component Value Date/Time   NA 136 09/30/2022 1055   NA 144 02/27/2020 0931   NA 141 03/09/2014 1533   K 5.0 09/30/2022 1055   K 4.2 03/09/2014 1533   CL 99 09/30/2022 1055   CL 101 03/09/2014 1533   CO2 27 09/30/2022 1055   CO2 30 03/09/2014 1533   GLUCOSE 180 (H) 09/30/2022 1055   GLUCOSE 95 03/09/2014 1533   BUN 50 (H) 09/30/2022 1055   BUN 40 (H) 02/27/2020 0931   BUN 35 (H) 03/09/2014 1533   CREATININE 8.93 (H) 09/30/2022 1055   CREATININE 1.50 (H) 03/09/2014 1533   CALCIUM 8.2 (L) 09/30/2022 1055   CALCIUM 8.8 03/09/2014 1533   GFRNONAA 5 (L) 09/30/2022 1055   GFRNONAA 39 (L) 03/09/2014 1533   GFRNONAA 55 (L) 07/31/2013 1521   GFRAA 35 (L) 02/27/2020 0931   GFRAA 47 (L) 03/09/2014 1533   GFRAA >60 07/31/2013 1521    BNP    Component Value Date/Time   BNP 196.5 (H) 06/06/2021 1329     Imaging:  No results found.  Administration History     None          Latest Ref Rng & Units 04/12/2021   10:31 AM  PFT Results  FVC-Pre L 1.35   FVC-Predicted Pre % 39   Pre FEV1/FVC % % 82   FEV1-Pre L 1.11   FEV1-Predicted Pre % 42     No results found for:  "NITRICOXIDE"      Assessment & Plan:   Restrictive lung disease Stable on current regimen. Receives benefit from use of Advair. Will refill her albuterol rescue inhaler. Aware to notify of increased usage. Healthy weight loss encouraged. Graded exercises.   Patient Instructions  Continue Albuterol inhaler 2 puffs every 6 hours as needed for shortness of breath or wheezing. Notify if symptoms persist despite rescue inhaler/neb use. Continue Advair 1 puff Twice daily. Brush tongue and rinse mouth afterwards Continue NIV nightly, minimum of 4-6 hours   Follow up in 1 year with Dr. Auston Left (new pt 30 min slot) or Katie Carsyn Taubman,NP. If symptoms do not improve or worsen, please contact office for sooner follow up or seek emergency care.    OSA (obstructive sleep apnea) OSA/OHS on NIV. Compliant with therapy. Excellent control. Receives benefit from use. Safe driving practices reviewed. Aware of risks of untreated OSA/OHS.  Morbid obesity with BMI of 60.0-69.9, adult (HCC) BMI 67. Healthy weight loss encouraged  ESRD on hemodialysis (HCC) Euvolemic. Follow up with nephrology as scheduled   Advised if symptoms do not improve or worsen, to please contact office for sooner follow up or seek emergency care.   I spent 32 minutes of dedicated  to the care of this patient on the date of this encounter to include pre-visit review of records, face-to-face time with the patient discussing conditions above, post visit ordering of testing, clinical documentation with the electronic health record, making appropriate referrals as documented, and communicating necessary findings to members of the patients care team.  Roetta Clarke, NP 05/27/2023  Pt aware and understands NP's role.

## 2023-05-25 NOTE — Patient Instructions (Addendum)
 Continue Albuterol inhaler 2 puffs every 6 hours as needed for shortness of breath or wheezing. Notify if symptoms persist despite rescue inhaler/neb use. Continue Advair 1 puff Twice daily. Brush tongue and rinse mouth afterwards Continue NIV nightly, minimum of 4-6 hours   Follow up in 1 year with Dr. Belia Heman (new pt 30 min slot) or Katie Gara Kincade,NP. If symptoms do not improve or worsen, please contact office for sooner follow up or seek emergency care.

## 2023-05-27 ENCOUNTER — Encounter: Payer: Self-pay | Admitting: Nurse Practitioner

## 2023-05-27 NOTE — Assessment & Plan Note (Signed)
 OSA/OHS on NIV. Compliant with therapy. Excellent control. Receives benefit from use. Safe driving practices reviewed. Aware of risks of untreated OSA/OHS.

## 2023-05-27 NOTE — Assessment & Plan Note (Signed)
 BMI 67. Healthy weight loss encouraged

## 2023-05-27 NOTE — Assessment & Plan Note (Signed)
 Stable on current regimen. Receives benefit from use of Advair. Will refill her albuterol rescue inhaler. Aware to notify of increased usage. Healthy weight loss encouraged. Graded exercises.   Patient Instructions  Continue Albuterol inhaler 2 puffs every 6 hours as needed for shortness of breath or wheezing. Notify if symptoms persist despite rescue inhaler/neb use. Continue Advair 1 puff Twice daily. Brush tongue and rinse mouth afterwards Continue NIV nightly, minimum of 4-6 hours   Follow up in 1 year with Dr. Auston Left (new pt 30 min slot) or Katie Nayel Purdy,NP. If symptoms do not improve or worsen, please contact office for sooner follow up or seek emergency care.

## 2023-05-27 NOTE — Assessment & Plan Note (Signed)
Euvolemic. Follow up with nephrology as scheduled.

## 2023-05-30 DIAGNOSIS — L02415 Cutaneous abscess of right lower limb: Secondary | ICD-10-CM | POA: Diagnosis not present

## 2023-06-19 DIAGNOSIS — E662 Morbid (severe) obesity with alveolar hypoventilation: Secondary | ICD-10-CM | POA: Diagnosis not present

## 2023-06-22 DIAGNOSIS — E221 Hyperprolactinemia: Secondary | ICD-10-CM | POA: Diagnosis not present

## 2023-06-22 DIAGNOSIS — E118 Type 2 diabetes mellitus with unspecified complications: Secondary | ICD-10-CM | POA: Diagnosis not present

## 2023-06-22 DIAGNOSIS — E039 Hypothyroidism, unspecified: Secondary | ICD-10-CM | POA: Diagnosis not present

## 2023-06-22 DIAGNOSIS — E1142 Type 2 diabetes mellitus with diabetic polyneuropathy: Secondary | ICD-10-CM | POA: Diagnosis not present

## 2023-06-25 ENCOUNTER — Encounter (INDEPENDENT_AMBULATORY_CARE_PROVIDER_SITE_OTHER)

## 2023-06-28 NOTE — H&P (View-Only) (Signed)
 MRN : [id number]  Nicole Schmidt is a 58 y.o. ([birth year]) female who presents with chief complaint of check access.  History of Present Illness:   The patient returns to the office for followup status post intervention of their dialysis access 01/05/2023.    Following the intervention the access function has significantly improved, with better flow rates and improved KT/V.  The patient notes a significant increase in problems with dialysis.  It is reported that adequate dialysis is not being achieved.   She notes that she has had 2 infiltrations and they are having increasing difficulty with access and needle placement in general.  The patient denies hand pain or other symptoms consistent with steal phenomena.  No significant arm swelling.  The patient denies redness or swelling at the access site. The patient denies fever or chills at home or while on dialysis.   Duplex ultrasound of the AV access shows a patent access.  The previously noted stenosis is improved compared to last study.  Flow volume today is 1242 cc/min (previous flow volume was 704 cc/min).  No specific narrowing is identified.     No outpatient medications have been marked as taking for the 07/02/23 encounter (Appointment) with Prescilla Brod, Ninette Basque, MD.    Past Medical History:  Diagnosis Date   (HFpEF) heart failure with preserved ejection fraction (HCC)    a. 10/2018 Echo: EF 60-65%, diast dysfxn, RVSP 50.7 mmHg; b. 01/2020 Echo: EF 65-70%, G2DD; c. 01/2021 Echo: EF 60-65%, no rwma, mod LVH. Nl RV fxn, mildly enlarged RV. Mod BAE. Triv MR. d. 05/2021 Echo: EF 55-60; no RWMAs, mild LVH, mild TR.   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 Er/PR pos, her 2 negative.  Margins 1 mm.   COPD  (chronic obstructive pulmonary disease) (HCC)    Diabetes mellitus type 2 in obese 02/09/2020   Difficult intubation 09/28/2021   a.) mallampati III-IV; BMI >55 kg/m   Diverticulosis    Dyslipidemia 02/09/2020   ESBL (extended spectrum beta-lactamase) producing bacteria infection 03/20/2021   a. urine culture (+) for ESBL producing E.coli and P. mirabilis   ESRD (end stage renal disease) on dialysis (HCC)    a. T-Th-Sat HD initiated 02/2021   Essential hypertension    Gout    History of kidney stones    Long term current use of anticoagulant    a.) apixaban    Menopause    age 74   Morbid obesity (HCC)    OSA treated with BiPAP    a. on nocturnal PAP therapy; Trilogy 100 ventilator   Permanent atrial fibrillation (HCC)    a. Dx 10/2018 in setting of PNA. b. CHA2DS2-VASc = 7 (sex, HFpEF, HTN, PE x2, aortic plaque, T2DM); c. s/p successful DCCV 11/2018. d. subsequent recurrent Afib; rate/rhythm maintained on oral metoprolol  succinate; chronically anticoagulated using standard dose apixaban .   Personal history of radiation therapy    Pulmonary embolism (HCC) 10/2014   a. on chronic apixaban    Restrictive lung disease    Shingles 10/24/2021   Sleep difficulties    a.) takes melatonin   Thyroid  goiter  Past Surgical History:  Procedure Laterality Date   A/V FISTULAGRAM Left 09/02/2021   Procedure: A/V Fistulagram;  Surgeon: Jackquelyn Mass, MD;  Location: [facility];  Service: Cardiovascular;  Laterality: Left;   A/V FISTULAGRAM Left 06/27/2022   Procedure: A/V Fistulagram;  Surgeon: Jackquelyn Mass, MD;  Location: [facility];  Service: Cardiovascular;  Laterality: Left;   A/V FISTULAGRAM Left 01/05/2023   Procedure: A/V Fistulagram;  Surgeon: Jackquelyn Mass, MD;  Location: [facility];  Service: Cardiovascular;  Laterality: Left;   AV FISTULA PLACEMENT Left 07/22/2021   Procedure: ARTERIOVENOUS (AV) FISTULA CREATION (RADIALCEPHALIC);  Surgeon:  Jackquelyn Mass, MD;  Location: [facility];  Service: Vascular;  Laterality: Left;   AV FISTULA PLACEMENT Left 12/09/2021   Procedure: ARTERIOVENOUS (AV) FISTULA CREATION (BRACHIAL CEPHALIC);  Surgeon: Jackquelyn Mass, MD;  Location: [facility];  Service: Vascular;  Laterality: Left;   BREAST BIOPSY Right 2014   breast ca   BREAST EXCISIONAL BIOPSY Right 07/19/2012   breast ca   BREAST SURGERY Right 2014   with sentinel node bx subareaolar duct excision   CARDIOVERSION N/A 11/28/2018   Procedure: CARDIOVERSION;  Surgeon: Devorah Fonder, MD;  Location: [facility];  Service: Cardiovascular;  Laterality: N/A;   COLONOSCOPY WITH PROPOFOL  N/A 01/30/2017   Procedure: COLONOSCOPY WITH PROPOFOL ;  Surgeon: Jerlean Mood, MD;  Location: [facility];  Service: Endoscopy;  Laterality: N/A;   CYSTOSCOPY W/ RETROGRADES  06/17/2021   Procedure: CYSTOSCOPY WITH RETROGRADE PYELOGRAM;  Surgeon: Lawerence Pressman, MD;  Location: [facility];  Service: Urology;;   CYSTOSCOPY W/ URETERAL STENT PLACEMENT Left 02/14/2021   Procedure: CYSTOSCOPY WITH RETROGRADE PYELOGRAM/URETERAL STENT PLACEMENT;  Surgeon: Lawerence Pressman, MD;  Location: [facility];  Service: Urology;  Laterality: Left;   CYSTOSCOPY/URETEROSCOPY/HOLMIUM LASER/STENT PLACEMENT Left 06/17/2021   Procedure: CYSTOSCOPY/URETEROSCOPY/HOLMIUM LASER/STENT EXCHANGE;  Surgeon: Lawerence Pressman, MD;  Location: [facility];  Service: Urology;  Laterality: Left;   DIALYSIS/PERMA CATHETER INSERTION N/A 02/24/2021   Procedure: DIALYSIS/PERMA CATHETER INSERTION;  Surgeon: Celso College, MD;  Location: [facility];  Service: Cardiovascular;  Laterality: N/A;   DIALYSIS/PERMA CATHETER INSERTION N/A 12/16/2021   Procedure: DIALYSIS/PERMA CATHETER INSERTION;  Surgeon: Jackquelyn Mass, MD;  Location: [facility];  Service: Cardiovascular;  Laterality: N/A;   DIALYSIS/PERMA CATHETER REMOVAL N/A 09/04/2022   Procedure: DIALYSIS/PERMA CATHETER REMOVAL;   Surgeon: Celso College, MD;  Location: [facility];  Service: Cardiovascular;  Laterality: N/A;   PERIPHERAL VASCULAR THROMBECTOMY Left 09/28/2021   Procedure: PERIPHERAL VASCULAR THROMBECTOMY;  Surgeon: Jackquelyn Mass, MD;  Location: [facility];  Service: Cardiovascular;  Laterality: Left;   REVISON OF ARTERIOVENOUS FISTULA Left 04/28/2022   Procedure: REVISON OF ARTERIOVENOUS FISTULA (BRACHIAL CEPHALIC);  Surgeon: Jackquelyn Mass, MD;  Location: [facility];  Service: Vascular;  Laterality: Left;    Social History Social History   Tobacco Use   Smoking status: Never    Passive exposure: Never   Smokeless tobacco: Never  Vaping Use   Vaping status: Never Used  Substance Use Topics   Alcohol use: No    Alcohol/week: 0.0 standard drinks of alcohol   Drug use: No    Family History Family History  Problem Relation Age of Onset   Diabetes Father    Hypertension Father    Parkinson's disease Father    Hypertension Mother    Rheum arthritis Mother    Atrial fibrillation Mother  Heart failure Mother    Heart attack Mother        died in her mid-70's.   Colon cancer Cousin 54   Breast cancer Neg Hx     Allergies  Allergen Reactions   Spironolactone      Hyperkalemia  Pt unaware of this allergy     REVIEW OF SYSTEMS (Negative unless checked)  Constitutional: [] Weight loss  [] Fever  [] Chills Cardiac: [] Chest pain   [] Chest pressure   [] Palpitations   [] Shortness of breath when laying flat   [] Shortness of breath with exertion. Vascular:  [] Pain in legs with walking   [] Pain in legs at rest  [] History of DVT   [] Phlebitis   [] Swelling in legs   [] Varicose veins   [] Non-healing ulcers Pulmonary:   [] Uses home oxygen    [] Productive cough   [] Hemoptysis   [] Wheeze  [] COPD   [] Asthma Neurologic:  [] Dizziness   [] Seizures   [] History of stroke   [] History of TIA  [] Aphasia   [] Vissual changes   [] Weakness or numbness in arm   [] Weakness or numbness in  leg Musculoskeletal:   [] Joint swelling   [] Joint pain   [] Low back pain Hematologic:  [] Easy bruising  [] Easy bleeding   [] Hypercoagulable state   [] Anemic Gastrointestinal:  [] Diarrhea   [] Vomiting  [] Gastroesophageal reflux/heartburn   [] Difficulty swallowing. Genitourinary:  [x] Chronic kidney disease   [] Difficult urination  [] Frequent urination   [] Blood in urine Skin:  [] Rashes   [] Ulcers  Psychological:  [] History of anxiety   []  History of major depression.  Physical Examination  There were no vitals filed for this visit. There is no height or weight on file to calculate BMI. Gen: WD/WN, NAD Head: Foss/AT, No temporalis wasting.  Ear/Nose/Throat: Hearing grossly intact, nares w/o erythema or drainage Eyes: PER, EOMI, sclera nonicteric.  Neck: Supple, no gross masses or lesions.  No JVD.  Pulmonary:  Good air movement, no audible wheezing, no use of accessory muscles.  Cardiac: RRR, precordium non-hyperdynamic. Vascular:   Left upper arm fistula with a very soft thrill and a high-pitched bruit Vessel Right Left  Radial Palpable Palpable  Brachial Palpable Palpable  Gastrointestinal: soft, non-distended. No guarding/no peritoneal signs.  Musculoskeletal: M/S 5/5 throughout.  No deformity.  Neurologic: CN 2-12 intact. Pain and light touch intact in extremities.  Symmetrical.  Speech is fluent. Motor exam as listed above. Psychiatric: Judgment intact, Mood & affect appropriate for pt's clinical situation. Dermatologic: No rashes or ulcers noted.  No changes consistent with cellulitis.   CBC Lab Results  Component Value Date   WBC 11.3 (H) 09/30/2022   HGB 12.4 09/30/2022   HCT 41.0 09/30/2022   MCV 115.8 (H) 09/30/2022   PLT 156 09/30/2022    BMET    Component Value Date/Time   NA 136 09/30/2022 1055   NA 144 02/27/2020 0931   NA 141 03/09/2014 1533   K 5.0 09/30/2022 1055   K 4.2 03/09/2014 1533   CL 99 09/30/2022 1055   CL 101 03/09/2014 1533   CO2 27 09/30/2022  1055   CO2 30 03/09/2014 1533   GLUCOSE 180 (H) 09/30/2022 1055   GLUCOSE 95 03/09/2014 1533   BUN 50 (H) 09/30/2022 1055   BUN 40 (H) 02/27/2020 0931   BUN 35 (H) 03/09/2014 1533   CREATININE 8.93 (H) 09/30/2022 1055   CREATININE 1.50 (H) 03/09/2014 1533   CALCIUM  8.2 (L) 09/30/2022 1055   CALCIUM  8.8 03/09/2014 1533   GFRNONAA 5 (L) 09/30/2022 1055  GFRNONAA 39 (L) 03/09/2014 1533   GFRNONAA 55 (L) 07/31/2013 1521   GFRAA 35 (L) 02/27/2020 0931   GFRAA 47 (L) 03/09/2014 1533   GFRAA >60 07/31/2013 1521   CrCl cannot be calculated (Patient's most recent lab result is older than the maximum 21 days allowed.).  COAG Lab Results  Component Value Date   INR 1.1 02/15/2021   INR 1.35 01/12/2018   INR 1.27 11/16/2017    Radiology No results found.   Assessment/Plan 1. ESRD on hemodialysis (HCC) (Primary) Recommend:  The patient is experiencing increasing problems with their dialysis access.  They have now infiltrated her access at least 2 times and are having a very difficult time with needle placement.  Patient should have a fistulagram with the intention for intervention.  The intention for intervention is to restore appropriate flow and prevent thrombosis and possible loss of the access.  As well as improve the quality of dialysis therapy.  The risks, benefits and alternative therapies were reviewed in detail with the patient.  All questions were answered.  The patient agrees to proceed with angio/intervention.    The patient will follow up with me in the office after the procedure.   2. Essential hypertension Continue antihypertensive medications as already ordered, these medications have been reviewed and there are no changes at this time.  3. Atrial fibrillation, chronic (HCC) Continue antiarrhythmia medications as already ordered, these medications have been reviewed and there are no changes at this time.  Continue anticoagulation as ordered by Cardiology  Service  4. Type 2 diabetes mellitus with obesity (HCC) Continue hypoglycemic medications as already ordered, these medications have been reviewed and there are no changes at this time.  Hgb A1C to be monitored as already arranged by primary service  5. Dyslipidemia Continue statin as ordered and reviewed, no changes at this time    [name], MD  06/28/2023 12:49 PM

## 2023-06-28 NOTE — Progress Notes (Signed)
 MRN : [id number]  Nicole Schmidt is a 58 y.o. ([birth year]) female who presents with chief complaint of check access.  History of Present Illness:   The patient returns to the office for followup status post intervention of their dialysis access 01/05/2023.    Following the intervention the access function has significantly improved, with better flow rates and improved KT/V.  The patient notes a significant increase in problems with dialysis.  It is reported that adequate dialysis is not being achieved.   She notes that she has had 2 infiltrations and they are having increasing difficulty with access and needle placement in general.  The patient denies hand pain or other symptoms consistent with steal phenomena.  No significant arm swelling.  The patient denies redness or swelling at the access site. The patient denies fever or chills at home or while on dialysis.   Duplex ultrasound of the AV access shows a patent access.  The previously noted stenosis is improved compared to last study.  Flow volume today is 1242 cc/min (previous flow volume was 704 cc/min).  No specific narrowing is identified.     No outpatient medications have been marked as taking for the 07/02/23 encounter (Appointment) with Prescilla Brod, Ninette Basque, MD.    Past Medical History:  Diagnosis Date   (HFpEF) heart failure with preserved ejection fraction (HCC)    a. 10/2018 Echo: EF 60-65%, diast dysfxn, RVSP 50.7 mmHg; b. 01/2020 Echo: EF 65-70%, G2DD; c. 01/2021 Echo: EF 60-65%, no rwma, mod LVH. Nl RV fxn, mildly enlarged RV. Mod BAE. Triv MR. d. 05/2021 Echo: EF 55-60; no RWMAs, mild LVH, mild TR.   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 Er/PR pos, her 2 negative.  Margins 1 mm.   COPD  (chronic obstructive pulmonary disease) (HCC)    Diabetes mellitus type 2 in obese 02/09/2020   Difficult intubation 09/28/2021   a.) mallampati III-IV; BMI >55 kg/m   Diverticulosis    Dyslipidemia 02/09/2020   ESBL (extended spectrum beta-lactamase) producing bacteria infection 03/20/2021   a. urine culture (+) for ESBL producing E.coli and P. mirabilis   ESRD (end stage renal disease) on dialysis (HCC)    a. T-Th-Sat HD initiated 02/2021   Essential hypertension    Gout    History of kidney stones    Long term current use of anticoagulant    a.) apixaban    Menopause    age 74   Morbid obesity (HCC)    OSA treated with BiPAP    a. on nocturnal PAP therapy; Trilogy 100 ventilator   Permanent atrial fibrillation (HCC)    a. Dx 10/2018 in setting of PNA. b. CHA2DS2-VASc = 7 (sex, HFpEF, HTN, PE x2, aortic plaque, T2DM); c. s/p successful DCCV 11/2018. d. subsequent recurrent Afib; rate/rhythm maintained on oral metoprolol  succinate; chronically anticoagulated using standard dose apixaban .   Personal history of radiation therapy    Pulmonary embolism (HCC) 10/2014   a. on chronic apixaban    Restrictive lung disease    Shingles 10/24/2021   Sleep difficulties    a.) takes melatonin   Thyroid  goiter  Past Surgical History:  Procedure Laterality Date   A/V FISTULAGRAM Left 09/02/2021   Procedure: A/V Fistulagram;  Surgeon: Jackquelyn Mass, MD;  Location: [facility];  Service: Cardiovascular;  Laterality: Left;   A/V FISTULAGRAM Left 06/27/2022   Procedure: A/V Fistulagram;  Surgeon: Jackquelyn Mass, MD;  Location: [facility];  Service: Cardiovascular;  Laterality: Left;   A/V FISTULAGRAM Left 01/05/2023   Procedure: A/V Fistulagram;  Surgeon: Jackquelyn Mass, MD;  Location: [facility];  Service: Cardiovascular;  Laterality: Left;   AV FISTULA PLACEMENT Left 07/22/2021   Procedure: ARTERIOVENOUS (AV) FISTULA CREATION (RADIALCEPHALIC);  Surgeon:  Jackquelyn Mass, MD;  Location: [facility];  Service: Vascular;  Laterality: Left;   AV FISTULA PLACEMENT Left 12/09/2021   Procedure: ARTERIOVENOUS (AV) FISTULA CREATION (BRACHIAL CEPHALIC);  Surgeon: Jackquelyn Mass, MD;  Location: [facility];  Service: Vascular;  Laterality: Left;   BREAST BIOPSY Right 2014   breast ca   BREAST EXCISIONAL BIOPSY Right 07/19/2012   breast ca   BREAST SURGERY Right 2014   with sentinel node bx subareaolar duct excision   CARDIOVERSION N/A 11/28/2018   Procedure: CARDIOVERSION;  Surgeon: Devorah Fonder, MD;  Location: [facility];  Service: Cardiovascular;  Laterality: N/A;   COLONOSCOPY WITH PROPOFOL  N/A 01/30/2017   Procedure: COLONOSCOPY WITH PROPOFOL ;  Surgeon: Jerlean Mood, MD;  Location: [facility];  Service: Endoscopy;  Laterality: N/A;   CYSTOSCOPY W/ RETROGRADES  06/17/2021   Procedure: CYSTOSCOPY WITH RETROGRADE PYELOGRAM;  Surgeon: Lawerence Pressman, MD;  Location: [facility];  Service: Urology;;   CYSTOSCOPY W/ URETERAL STENT PLACEMENT Left 02/14/2021   Procedure: CYSTOSCOPY WITH RETROGRADE PYELOGRAM/URETERAL STENT PLACEMENT;  Surgeon: Lawerence Pressman, MD;  Location: [facility];  Service: Urology;  Laterality: Left;   CYSTOSCOPY/URETEROSCOPY/HOLMIUM LASER/STENT PLACEMENT Left 06/17/2021   Procedure: CYSTOSCOPY/URETEROSCOPY/HOLMIUM LASER/STENT EXCHANGE;  Surgeon: Lawerence Pressman, MD;  Location: [facility];  Service: Urology;  Laterality: Left;   DIALYSIS/PERMA CATHETER INSERTION N/A 02/24/2021   Procedure: DIALYSIS/PERMA CATHETER INSERTION;  Surgeon: Celso College, MD;  Location: [facility];  Service: Cardiovascular;  Laterality: N/A;   DIALYSIS/PERMA CATHETER INSERTION N/A 12/16/2021   Procedure: DIALYSIS/PERMA CATHETER INSERTION;  Surgeon: Jackquelyn Mass, MD;  Location: [facility];  Service: Cardiovascular;  Laterality: N/A;   DIALYSIS/PERMA CATHETER REMOVAL N/A 09/04/2022   Procedure: DIALYSIS/PERMA CATHETER REMOVAL;   Surgeon: Celso College, MD;  Location: [facility];  Service: Cardiovascular;  Laterality: N/A;   PERIPHERAL VASCULAR THROMBECTOMY Left 09/28/2021   Procedure: PERIPHERAL VASCULAR THROMBECTOMY;  Surgeon: Jackquelyn Mass, MD;  Location: [facility];  Service: Cardiovascular;  Laterality: Left;   REVISON OF ARTERIOVENOUS FISTULA Left 04/28/2022   Procedure: REVISON OF ARTERIOVENOUS FISTULA (BRACHIAL CEPHALIC);  Surgeon: Jackquelyn Mass, MD;  Location: [facility];  Service: Vascular;  Laterality: Left;    Social History Social History   Tobacco Use   Smoking status: Never    Passive exposure: Never   Smokeless tobacco: Never  Vaping Use   Vaping status: Never Used  Substance Use Topics   Alcohol use: No    Alcohol/week: 0.0 standard drinks of alcohol   Drug use: No    Family History Family History  Problem Relation Age of Onset   Diabetes Father    Hypertension Father    Parkinson's disease Father    Hypertension Mother    Rheum arthritis Mother    Atrial fibrillation Mother  Heart failure Mother    Heart attack Mother        died in her mid-70's.   Colon cancer Cousin 54   Breast cancer Neg Hx     Allergies  Allergen Reactions   Spironolactone      Hyperkalemia  Pt unaware of this allergy     REVIEW OF SYSTEMS (Negative unless checked)  Constitutional: [] Weight loss  [] Fever  [] Chills Cardiac: [] Chest pain   [] Chest pressure   [] Palpitations   [] Shortness of breath when laying flat   [] Shortness of breath with exertion. Vascular:  [] Pain in legs with walking   [] Pain in legs at rest  [] History of DVT   [] Phlebitis   [] Swelling in legs   [] Varicose veins   [] Non-healing ulcers Pulmonary:   [] Uses home oxygen    [] Productive cough   [] Hemoptysis   [] Wheeze  [] COPD   [] Asthma Neurologic:  [] Dizziness   [] Seizures   [] History of stroke   [] History of TIA  [] Aphasia   [] Vissual changes   [] Weakness or numbness in arm   [] Weakness or numbness in  leg Musculoskeletal:   [] Joint swelling   [] Joint pain   [] Low back pain Hematologic:  [] Easy bruising  [] Easy bleeding   [] Hypercoagulable state   [] Anemic Gastrointestinal:  [] Diarrhea   [] Vomiting  [] Gastroesophageal reflux/heartburn   [] Difficulty swallowing. Genitourinary:  [x] Chronic kidney disease   [] Difficult urination  [] Frequent urination   [] Blood in urine Skin:  [] Rashes   [] Ulcers  Psychological:  [] History of anxiety   []  History of major depression.  Physical Examination  There were no vitals filed for this visit. There is no height or weight on file to calculate BMI. Gen: WD/WN, NAD Head: Foss/AT, No temporalis wasting.  Ear/Nose/Throat: Hearing grossly intact, nares w/o erythema or drainage Eyes: PER, EOMI, sclera nonicteric.  Neck: Supple, no gross masses or lesions.  No JVD.  Pulmonary:  Good air movement, no audible wheezing, no use of accessory muscles.  Cardiac: RRR, precordium non-hyperdynamic. Vascular:   Left upper arm fistula with a very soft thrill and a high-pitched bruit Vessel Right Left  Radial Palpable Palpable  Brachial Palpable Palpable  Gastrointestinal: soft, non-distended. No guarding/no peritoneal signs.  Musculoskeletal: M/S 5/5 throughout.  No deformity.  Neurologic: CN 2-12 intact. Pain and light touch intact in extremities.  Symmetrical.  Speech is fluent. Motor exam as listed above. Psychiatric: Judgment intact, Mood & affect appropriate for pt's clinical situation. Dermatologic: No rashes or ulcers noted.  No changes consistent with cellulitis.   CBC Lab Results  Component Value Date   WBC 11.3 (H) 09/30/2022   HGB 12.4 09/30/2022   HCT 41.0 09/30/2022   MCV 115.8 (H) 09/30/2022   PLT 156 09/30/2022    BMET    Component Value Date/Time   NA 136 09/30/2022 1055   NA 144 02/27/2020 0931   NA 141 03/09/2014 1533   K 5.0 09/30/2022 1055   K 4.2 03/09/2014 1533   CL 99 09/30/2022 1055   CL 101 03/09/2014 1533   CO2 27 09/30/2022  1055   CO2 30 03/09/2014 1533   GLUCOSE 180 (H) 09/30/2022 1055   GLUCOSE 95 03/09/2014 1533   BUN 50 (H) 09/30/2022 1055   BUN 40 (H) 02/27/2020 0931   BUN 35 (H) 03/09/2014 1533   CREATININE 8.93 (H) 09/30/2022 1055   CREATININE 1.50 (H) 03/09/2014 1533   CALCIUM  8.2 (L) 09/30/2022 1055   CALCIUM  8.8 03/09/2014 1533   GFRNONAA 5 (L) 09/30/2022 1055  GFRNONAA 39 (L) 03/09/2014 1533   GFRNONAA 55 (L) 07/31/2013 1521   GFRAA 35 (L) 02/27/2020 0931   GFRAA 47 (L) 03/09/2014 1533   GFRAA >60 07/31/2013 1521   CrCl cannot be calculated (Patient's most recent lab result is older than the maximum 21 days allowed.).  COAG Lab Results  Component Value Date   INR 1.1 02/15/2021   INR 1.35 01/12/2018   INR 1.27 11/16/2017    Radiology No results found.   Assessment/Plan 1. ESRD on hemodialysis (HCC) (Primary) Recommend:  The patient is experiencing increasing problems with their dialysis access.  They have now infiltrated her access at least 2 times and are having a very difficult time with needle placement.  Patient should have a fistulagram with the intention for intervention.  The intention for intervention is to restore appropriate flow and prevent thrombosis and possible loss of the access.  As well as improve the quality of dialysis therapy.  The risks, benefits and alternative therapies were reviewed in detail with the patient.  All questions were answered.  The patient agrees to proceed with angio/intervention.    The patient will follow up with me in the office after the procedure.   2. Essential hypertension Continue antihypertensive medications as already ordered, these medications have been reviewed and there are no changes at this time.  3. Atrial fibrillation, chronic (HCC) Continue antiarrhythmia medications as already ordered, these medications have been reviewed and there are no changes at this time.  Continue anticoagulation as ordered by Cardiology  Service  4. Type 2 diabetes mellitus with obesity (HCC) Continue hypoglycemic medications as already ordered, these medications have been reviewed and there are no changes at this time.  Hgb A1C to be monitored as already arranged by primary service  5. Dyslipidemia Continue statin as ordered and reviewed, no changes at this time    [name], MD  06/28/2023 12:49 PM

## 2023-06-29 ENCOUNTER — Other Ambulatory Visit (INDEPENDENT_AMBULATORY_CARE_PROVIDER_SITE_OTHER): Payer: Self-pay | Admitting: Nurse Practitioner

## 2023-06-29 DIAGNOSIS — N186 End stage renal disease: Secondary | ICD-10-CM

## 2023-07-02 ENCOUNTER — Ambulatory Visit (INDEPENDENT_AMBULATORY_CARE_PROVIDER_SITE_OTHER): Admitting: Vascular Surgery

## 2023-07-02 ENCOUNTER — Ambulatory Visit (INDEPENDENT_AMBULATORY_CARE_PROVIDER_SITE_OTHER)

## 2023-07-02 ENCOUNTER — Ambulatory Visit (INDEPENDENT_AMBULATORY_CARE_PROVIDER_SITE_OTHER): Payer: BC Managed Care – PPO | Admitting: Vascular Surgery

## 2023-07-02 ENCOUNTER — Encounter (INDEPENDENT_AMBULATORY_CARE_PROVIDER_SITE_OTHER): Payer: BC Managed Care – PPO

## 2023-07-02 ENCOUNTER — Encounter (INDEPENDENT_AMBULATORY_CARE_PROVIDER_SITE_OTHER): Payer: Self-pay | Admitting: Vascular Surgery

## 2023-07-02 VITALS — BP 119/59 | HR 104 | Resp 18 | Ht 61.0 in | Wt 356.0 lb

## 2023-07-02 DIAGNOSIS — I482 Chronic atrial fibrillation, unspecified: Secondary | ICD-10-CM

## 2023-07-02 DIAGNOSIS — N186 End stage renal disease: Secondary | ICD-10-CM

## 2023-07-02 DIAGNOSIS — I10 Essential (primary) hypertension: Secondary | ICD-10-CM | POA: Diagnosis not present

## 2023-07-02 DIAGNOSIS — Z992 Dependence on renal dialysis: Secondary | ICD-10-CM

## 2023-07-02 DIAGNOSIS — E1169 Type 2 diabetes mellitus with other specified complication: Secondary | ICD-10-CM | POA: Diagnosis not present

## 2023-07-02 DIAGNOSIS — E669 Obesity, unspecified: Secondary | ICD-10-CM

## 2023-07-02 DIAGNOSIS — E785 Hyperlipidemia, unspecified: Secondary | ICD-10-CM

## 2023-07-03 ENCOUNTER — Encounter (INDEPENDENT_AMBULATORY_CARE_PROVIDER_SITE_OTHER): Payer: Self-pay

## 2023-07-04 ENCOUNTER — Telehealth (INDEPENDENT_AMBULATORY_CARE_PROVIDER_SITE_OTHER): Payer: Self-pay

## 2023-07-04 NOTE — Telephone Encounter (Signed)
 Spoke with the patient and she is scheduled with Dr. Prescilla Brod for a left arm fistulagram on 07/06/23 with a 10:00 am arrival time to the [facility]. Anesthesia has been requested with Megan as well. Pre-procedure instructions were discussed and will be sent to Mychart.

## 2023-07-06 ENCOUNTER — Ambulatory Visit: Admitting: Anesthesiology

## 2023-07-06 ENCOUNTER — Other Ambulatory Visit: Payer: Self-pay

## 2023-07-06 ENCOUNTER — Encounter: Payer: Self-pay | Admitting: Vascular Surgery

## 2023-07-06 ENCOUNTER — Ambulatory Visit
Admission: RE | Admit: 2023-07-06 | Discharge: 2023-07-06 | Disposition: A | Discharge status: Home / Self Care | Attending: Vascular Surgery | Admitting: Vascular Surgery

## 2023-07-06 ENCOUNTER — Encounter
Admission: RE | Disposition: A | Discharge status: Home / Self Care | Payer: Self-pay | Source: Home / Self Care | Attending: Vascular Surgery

## 2023-07-06 DIAGNOSIS — N186 End stage renal disease: Secondary | ICD-10-CM | POA: Diagnosis not present

## 2023-07-06 DIAGNOSIS — E785 Hyperlipidemia, unspecified: Secondary | ICD-10-CM | POA: Diagnosis not present

## 2023-07-06 DIAGNOSIS — I132 Hypertensive heart and chronic kidney disease with heart failure and with stage 5 chronic kidney disease, or end stage renal disease: Secondary | ICD-10-CM | POA: Insufficient documentation

## 2023-07-06 DIAGNOSIS — Z7984 Long term (current) use of oral hypoglycemic drugs: Secondary | ICD-10-CM | POA: Insufficient documentation

## 2023-07-06 DIAGNOSIS — T82858A Stenosis of vascular prosthetic devices, implants and grafts, initial encounter: Secondary | ICD-10-CM

## 2023-07-06 DIAGNOSIS — E1122 Type 2 diabetes mellitus with diabetic chronic kidney disease: Secondary | ICD-10-CM | POA: Diagnosis not present

## 2023-07-06 DIAGNOSIS — Z7901 Long term (current) use of anticoagulants: Secondary | ICD-10-CM | POA: Insufficient documentation

## 2023-07-06 DIAGNOSIS — T82898A Other specified complication of vascular prosthetic devices, implants and grafts, initial encounter: Secondary | ICD-10-CM

## 2023-07-06 DIAGNOSIS — I482 Chronic atrial fibrillation, unspecified: Secondary | ICD-10-CM | POA: Diagnosis not present

## 2023-07-06 DIAGNOSIS — Y832 Surgical operation with anastomosis, bypass or graft as the cause of abnormal reaction of the patient, or of later complication, without mention of misadventure at the time of the procedure: Secondary | ICD-10-CM | POA: Diagnosis not present

## 2023-07-06 DIAGNOSIS — I5033 Acute on chronic diastolic (congestive) heart failure: Secondary | ICD-10-CM | POA: Diagnosis not present

## 2023-07-06 DIAGNOSIS — Z992 Dependence on renal dialysis: Secondary | ICD-10-CM | POA: Diagnosis not present

## 2023-07-06 DIAGNOSIS — T82510A Breakdown (mechanical) of surgically created arteriovenous fistula, initial encounter: Secondary | ICD-10-CM | POA: Insufficient documentation

## 2023-07-06 DIAGNOSIS — J449 Chronic obstructive pulmonary disease, unspecified: Secondary | ICD-10-CM | POA: Diagnosis not present

## 2023-07-06 DIAGNOSIS — I5032 Chronic diastolic (congestive) heart failure: Secondary | ICD-10-CM | POA: Insufficient documentation

## 2023-07-06 DIAGNOSIS — Z9889 Other specified postprocedural states: Secondary | ICD-10-CM

## 2023-07-06 HISTORY — PX: A/V FISTULAGRAM: CATH118298

## 2023-07-06 LAB — GLUCOSE, CAPILLARY
Glucose-Capillary: 139 mg/dL — ABNORMAL HIGH (ref 70–99)
Glucose-Capillary: 159 mg/dL — ABNORMAL HIGH (ref 70–99)

## 2023-07-06 LAB — POTASSIUM (ARMC VASCULAR LAB ONLY): Potassium (ARMC vascular lab): 4 mmol/L (ref 3.5–5.1)

## 2023-07-06 SURGERY — A/V FISTULAGRAM
Anesthesia: General | Laterality: Left

## 2023-07-06 MED ORDER — ALBUTEROL SULFATE HFA 108 (90 BASE) MCG/ACT IN AERS
INHALATION_SPRAY | RESPIRATORY_TRACT | Status: DC | PRN
  Administered 2023-07-06: 4 via RESPIRATORY_TRACT
  Administered 2023-07-06: 2 via RESPIRATORY_TRACT

## 2023-07-06 MED ORDER — PROPOFOL 10 MG/ML IV BOLUS
INTRAVENOUS | Status: DC | PRN
  Administered 2023-07-06: 150 mg via INTRAVENOUS

## 2023-07-06 MED ORDER — ONDANSETRON HCL 4 MG/2ML IJ SOLN
INTRAMUSCULAR | Status: DC | PRN
  Administered 2023-07-06: 4 mg via INTRAVENOUS

## 2023-07-06 MED ORDER — FAMOTIDINE 20 MG PO TABS: 40.0000 mg | ORAL_TABLET | Freq: Once | ORAL | Status: DC | PRN

## 2023-07-06 MED ORDER — IPRATROPIUM-ALBUTEROL 0.5-2.5 (3) MG/3ML IN SOLN
3.0000 mL | Freq: Once | RESPIRATORY_TRACT | Status: AC
  Administered 2023-07-06: 3 mL via RESPIRATORY_TRACT

## 2023-07-06 MED ORDER — LIDOCAINE HCL (PF) 2 % IJ SOLN
INTRAMUSCULAR | Status: AC
  Filled 2023-07-06: qty 5

## 2023-07-06 MED ORDER — IPRATROPIUM-ALBUTEROL 0.5-2.5 (3) MG/3ML IN SOLN
RESPIRATORY_TRACT | Status: AC
  Filled 2023-07-06: qty 3

## 2023-07-06 MED ORDER — ALBUTEROL SULFATE HFA 108 (90 BASE) MCG/ACT IN AERS
INHALATION_SPRAY | RESPIRATORY_TRACT | Status: AC
  Filled 2023-07-06: qty 6.7

## 2023-07-06 MED ORDER — SODIUM CHLORIDE 0.9 % IV SOLN: INTRAVENOUS | Status: DC

## 2023-07-06 MED ORDER — HEPARIN SODIUM (PORCINE) 1000 UNIT/ML IJ SOLN
INTRAMUSCULAR | Status: DC | PRN
  Administered 2023-07-06: 4000 [IU] via INTRAVENOUS

## 2023-07-06 MED ORDER — ROCURONIUM BROMIDE 10 MG/ML (PF) SYRINGE
PREFILLED_SYRINGE | INTRAVENOUS | Status: AC
  Filled 2023-07-06: qty 10

## 2023-07-06 MED ORDER — PHENYLEPHRINE 80 MCG/ML (10ML) SYRINGE FOR IV PUSH (FOR BLOOD PRESSURE SUPPORT)
PREFILLED_SYRINGE | INTRAVENOUS | Status: AC
  Filled 2023-07-06: qty 10

## 2023-07-06 MED ORDER — MIDAZOLAM HCL 2 MG/ML PO SYRP: 8.0000 mg | ORAL_SOLUTION | Freq: Once | ORAL | Status: DC | PRN

## 2023-07-06 MED ORDER — HEPARIN SODIUM (PORCINE) 1000 UNIT/ML IJ SOLN
INTRAMUSCULAR | Status: AC
  Filled 2023-07-06: qty 10

## 2023-07-06 MED ORDER — FENTANYL CITRATE (PF) 100 MCG/2ML IJ SOLN
INTRAMUSCULAR | Status: DC | PRN
Start: 2023-07-06 — End: 2023-07-06
  Administered 2023-07-06 (×2): 25 ug via INTRAVENOUS

## 2023-07-06 MED ORDER — MIDAZOLAM HCL 2 MG/2ML IJ SOLN
INTRAMUSCULAR | Status: AC
  Filled 2023-07-06: qty 2

## 2023-07-06 MED ORDER — PROPOFOL 10 MG/ML IV BOLUS
INTRAVENOUS | Status: AC
  Filled 2023-07-06: qty 20

## 2023-07-06 MED ORDER — ONDANSETRON HCL 4 MG/2ML IJ SOLN: 4.0000 mg | Freq: Four times a day (QID) | INTRAMUSCULAR | Status: DC | PRN

## 2023-07-06 MED ORDER — SUGAMMADEX SODIUM 200 MG/2ML IV SOLN
INTRAVENOUS | Status: DC | PRN
  Administered 2023-07-06: 186.2 mg via INTRAVENOUS

## 2023-07-06 MED ORDER — METHYLPREDNISOLONE SODIUM SUCC 125 MG IJ SOLR
125.0000 mg | Freq: Once | INTRAMUSCULAR | Status: DC | PRN
Start: 2023-07-06 — End: 2023-07-06

## 2023-07-06 MED ORDER — PHENYLEPHRINE 80 MCG/ML (10ML) SYRINGE FOR IV PUSH (FOR BLOOD PRESSURE SUPPORT)
PREFILLED_SYRINGE | INTRAVENOUS | Status: DC | PRN
  Administered 2023-07-06: 160 ug via INTRAVENOUS
  Administered 2023-07-06 (×2): 80 ug via INTRAVENOUS

## 2023-07-06 MED ORDER — MIDAZOLAM HCL 2 MG/2ML IJ SOLN
INTRAMUSCULAR | Status: DC | PRN
  Administered 2023-07-06: 2 mg via INTRAVENOUS

## 2023-07-06 MED ORDER — FENTANYL CITRATE (PF) 100 MCG/2ML IJ SOLN
INTRAMUSCULAR | Status: AC
  Filled 2023-07-06: qty 2

## 2023-07-06 MED ORDER — DIPHENHYDRAMINE HCL 50 MG/ML IJ SOLN
50.0000 mg | Freq: Once | INTRAMUSCULAR | Status: DC | PRN
Start: 2023-07-06 — End: 2023-07-06

## 2023-07-06 MED ORDER — SUCCINYLCHOLINE CHLORIDE 200 MG/10ML IV SOSY
PREFILLED_SYRINGE | INTRAVENOUS | Status: DC | PRN
  Administered 2023-07-06: 100 mg via INTRAVENOUS

## 2023-07-06 MED ORDER — EPHEDRINE SULFATE-NACL 50-0.9 MG/10ML-% IV SOSY
PREFILLED_SYRINGE | INTRAVENOUS | Status: DC | PRN
  Administered 2023-07-06 (×2): 5 mg via INTRAVENOUS

## 2023-07-06 MED ORDER — SUCCINYLCHOLINE CHLORIDE 200 MG/10ML IV SOSY
PREFILLED_SYRINGE | INTRAVENOUS | Status: AC
Start: 2023-07-06 — End: ?
  Filled 2023-07-06: qty 10

## 2023-07-06 MED ORDER — PHENYLEPHRINE HCL-NACL 20-0.9 MG/250ML-% IV SOLN
INTRAVENOUS | Status: DC | PRN
  Administered 2023-07-06: 40 ug/min via INTRAVENOUS

## 2023-07-06 MED ORDER — DEXAMETHASONE SODIUM PHOSPHATE 10 MG/ML IJ SOLN
INTRAMUSCULAR | Status: DC | PRN
  Administered 2023-07-06: 10 mg via INTRAVENOUS

## 2023-07-06 MED ORDER — HYDROMORPHONE HCL 1 MG/ML IJ SOLN: 1.0000 mg | Freq: Once | INTRAMUSCULAR | Status: DC | PRN

## 2023-07-06 MED ORDER — CEFAZOLIN SODIUM-DEXTROSE 1-4 GM/50ML-% IV SOLN
INTRAVENOUS | Status: AC
  Filled 2023-07-06: qty 50

## 2023-07-06 MED ORDER — DEXAMETHASONE SODIUM PHOSPHATE 10 MG/ML IJ SOLN
INTRAMUSCULAR | Status: AC
  Filled 2023-07-06: qty 1

## 2023-07-06 MED ORDER — CEFAZOLIN SODIUM-DEXTROSE 1-4 GM/50ML-% IV SOLN
1.0000 g | INTRAVENOUS | Status: AC
  Administered 2023-07-06: 3 g via INTRAVENOUS

## 2023-07-06 MED ORDER — EPHEDRINE 5 MG/ML INJ
INTRAVENOUS | Status: AC
  Filled 2023-07-06: qty 5

## 2023-07-06 MED ORDER — HEPARIN (PORCINE) IN NACL 2000-0.9 UNIT/L-% IV SOLN
INTRAVENOUS | Status: DC | PRN
Start: 2023-07-06 — End: 2023-07-06
  Administered 2023-07-06: 1000 mL

## 2023-07-06 MED ORDER — ROCURONIUM BROMIDE 100 MG/10ML IV SOLN
INTRAVENOUS | Status: DC | PRN
  Administered 2023-07-06: 20 mg via INTRAVENOUS

## 2023-07-06 MED ORDER — LIDOCAINE HCL (CARDIAC) PF 100 MG/5ML IV SOSY
PREFILLED_SYRINGE | INTRAVENOUS | Status: DC | PRN
  Administered 2023-07-06: 100 mg via INTRAVENOUS

## 2023-07-06 MED ORDER — PROPOFOL 1000 MG/100ML IV EMUL
INTRAVENOUS | Status: AC
  Filled 2023-07-06: qty 100

## 2023-07-06 MED ORDER — IODIXANOL 320 MG/ML IV SOLN
INTRAVENOUS | Status: DC | PRN
  Administered 2023-07-06: 35 mL via INTRA_ARTERIAL

## 2023-07-06 MED ORDER — ONDANSETRON HCL 4 MG/2ML IJ SOLN
INTRAMUSCULAR | Status: AC
  Filled 2023-07-06: qty 2

## 2023-07-06 SURGICAL SUPPLY — 22 items
BALLOON DORADO 9X40X80 (BALLOONS) IMPLANT
BALLOON DORADO 9X80X80 (BALLOONS) IMPLANT
CATH BEACON 5 .035 40 KMP TP (CATHETERS) IMPLANT
CATH BEACON 5.038 65CM KMP-01 (CATHETERS) IMPLANT
COVER PROBE ULTRASOUND 5X96 (MISCELLANEOUS) IMPLANT
DEVICE PRESTO INFLATION (MISCELLANEOUS) IMPLANT
GOWN STRL REUS W/ TWL LRG LVL3 (GOWN DISPOSABLE) ×1 IMPLANT
NDL ENTRY 21GA 7CM ECHOTIP (NEEDLE) IMPLANT
NEEDLE ENTRY 21GA 7CM ECHOTIP (NEEDLE) ×1 IMPLANT
PACK ANGIOGRAPHY (CUSTOM PROCEDURE TRAY) ×1 IMPLANT
SET INTRO CAPELLA COAXIAL (SET/KITS/TRAYS/PACK) IMPLANT
SHEATH BRITE TIP 6FRX5.5 (SHEATH) IMPLANT
SHEATH BRITE TIP 7FRX5.5 (SHEATH) IMPLANT
SHEATH BRITE TIP 8FR 5.5 (SHEATH) IMPLANT
STENT COVERA FLARED 9X60X80 (Permanent Stent) IMPLANT
STENT VIABAHN 8X50X120 (Permanent Stent) IMPLANT
STENT VIABAHN 9X5X120 (Permanent Stent) IMPLANT
SUT MNCRL AB 4-0 PS2 18 (SUTURE) IMPLANT
WIRE G 018X200 V18 (WIRE) IMPLANT
WIRE J 3MM .035X145CM (WIRE) IMPLANT
WIRE SUPRACORE 190CM (WIRE) IMPLANT
WIRE SUPRACORE 300CM (WIRE) IMPLANT

## 2023-07-06 NOTE — Anesthesia Preprocedure Evaluation (Signed)
 Anesthesia Evaluation  Patient identified by MRN, date of birth, ID band Patient awake    Reviewed: Allergy & Precautions, NPO status , Patient's Chart, lab work & pertinent test results  Airway Mallampati: III  TM Distance: >3 FB Neck ROM: full    Dental  (+) Teeth Intact   Pulmonary neg pulmonary ROS, sleep apnea and Continuous Positive Airway Pressure Ventilation , COPD   Pulmonary exam normal  + decreased breath sounds      Cardiovascular Exercise Tolerance: Poor hypertension, Pt. on medications +CHF and + DOE  negative cardio ROS Normal cardiovascular exam Rhythm:Regular Rate:Normal     Neuro/Psych negative neurological ROS  negative psych ROS   GI/Hepatic negative GI ROS, Neg liver ROS,,,  Endo/Other  negative endocrine ROSdiabetes, Type 2, Oral Hypoglycemic AgentsHypothyroidism  Class 4 obesity  Renal/GU ESRFRenal disease     Musculoskeletal   Abdominal  (+) + obese  Peds negative pediatric ROS (+)  Hematology negative hematology ROS (+) Blood dyscrasia, anemia   Anesthesia Other Findings Past Medical History: No date: (HFpEF) heart failure with preserved ejection fraction (HCC)     Comment:  a. 10/2018 Echo: EF 60-65%, diast dysfxn, RVSP 50.7 mmHg;              b. 01/2020 Echo: EF 65-70%, G2DD; c. 01/2021 Echo: EF               60-65%, no rwma, mod LVH. Nl RV fxn, mildly enlarged RV.               Mod BAE. Triv MR. d. 05/2021 Echo: EF 55-60; no RWMAs,               mild LVH, mild TR. 02/09/2020: Acquired hypothyroidism No date: Adenoma of left adrenal gland No date: Anemia No date: Aortic atherosclerosis (HCC) No date: Arthritis No date: Breast cancer of upper-inner quadrant of right female breast  (HCC)     Comment:  Right breast invasive CA and DCIS , 7 mm T1,N0,M0. Er/PR              pos, her 2 negative.  Margins 1 mm. No date: COPD (chronic obstructive pulmonary disease) (HCC) 02/09/2020:  Diabetes mellitus type 2 in obese 09/28/2021: Difficult intubation     Comment:  a.) mallampati III-IV; BMI >55 kg/m No date: Diverticulosis 02/09/2020: Dyslipidemia 03/20/2021: ESBL (extended spectrum beta-lactamase) producing  bacteria infection     Comment:  a. urine culture (+) for ESBL producing E.coli and P.               mirabilis No date: ESRD (end stage renal disease) on dialysis (HCC)     Comment:  a. T-Th-Sat HD initiated 02/2021 No date: Essential hypertension No date: Gout No date: History of kidney stones No date: Long term current use of anticoagulant     Comment:  a.) apixaban  No date: Menopause     Comment:  age 58 No date: Morbid obesity (HCC) No date: OSA treated with BiPAP     Comment:  a. on nocturnal PAP therapy; Trilogy 100 ventilator No date: Permanent atrial fibrillation (HCC)     Comment:  a. Dx 10/2018 in setting of PNA. b. CHA2DS2-VASc = 7               (sex, HFpEF, HTN, PE x2, aortic plaque, T2DM); c. s/p               successful DCCV 11/2018. d. subsequent recurrent Afib;  rate/rhythm maintained on oral metoprolol  succinate;               chronically anticoagulated using standard dose apixaban . No date: Personal history of radiation therapy 10/2014: Pulmonary embolism (HCC)     Comment:  a. on chronic apixaban  No date: Restrictive lung disease 10/24/2021: Shingles No date: Sleep difficulties     Comment:  a.) takes melatonin No date: Thyroid  goiter  Past Surgical History: 09/02/2021: A/V FISTULAGRAM; Left     Comment:  Procedure: A/V Fistulagram;  Surgeon: Jackquelyn Mass, MD;  Location: [facility];  Service:               Cardiovascular;  Laterality: Left; 06/27/2022: A/V FISTULAGRAM; Left     Comment:  Procedure: A/V Fistulagram;  Surgeon: Jackquelyn Mass, MD;  Location: [facility];  Service:               Cardiovascular;  Laterality: Left; 01/05/2023: A/V FISTULAGRAM; Left      Comment:  Procedure: A/V Fistulagram;  Surgeon: Jackquelyn Mass, MD;  Location: [facility];  Service:               Cardiovascular;  Laterality: Left; 07/22/2021: AV FISTULA PLACEMENT; Left     Comment:  Procedure: ARTERIOVENOUS (AV) FISTULA CREATION               (RADIALCEPHALIC);  Surgeon: Jackquelyn Mass, MD;                Location: [facility];  Service: Vascular;  Laterality:               Left; 12/09/2021: AV FISTULA PLACEMENT; Left     Comment:  Procedure: ARTERIOVENOUS (AV) FISTULA CREATION (BRACHIAL              CEPHALIC);  Surgeon: Jackquelyn Mass, MD;  Location:               [facility];  Service: Vascular;  Laterality: Left; 2014: BREAST BIOPSY; Right     Comment:  breast ca 07/19/2012: BREAST EXCISIONAL BIOPSY; Right     Comment:  breast ca 2014: BREAST SURGERY; Right     Comment:  with sentinel node bx subareaolar duct excision 11/28/2018: CARDIOVERSION; N/A     Comment:  Procedure: CARDIOVERSION;  Surgeon: Devorah Fonder,               MD;  Location: [facility];  Service: Cardiovascular;                Laterality: N/A; 01/30/2017: COLONOSCOPY WITH PROPOFOL ; N/A     Comment:  Procedure: COLONOSCOPY WITH PROPOFOL ;  Surgeon: Jerlean Mood, MD;  Location: [facility];  Service:               Endoscopy;  Laterality: N/A; 06/17/2021: CYSTOSCOPY W/ RETROGRADES     Comment:  Procedure: CYSTOSCOPY WITH RETROGRADE PYELOGRAM;                Surgeon: Lawerence Pressman, MD;  Location: [facility];                Service:  Urology;; 02/14/2021: CYSTOSCOPY W/ URETERAL STENT PLACEMENT; Left     Comment:  Procedure: CYSTOSCOPY WITH RETROGRADE PYELOGRAM/URETERAL              STENT PLACEMENT;  Surgeon: Lawerence Pressman, MD;                Location: [facility];  Service: Urology;  Laterality: Left; 06/17/2021: CYSTOSCOPY/URETEROSCOPY/HOLMIUM LASER/STENT PLACEMENT; Left     Comment:  Procedure: CYSTOSCOPY/URETEROSCOPY/HOLMIUM LASER/STENT                EXCHANGE;  Surgeon: Lawerence Pressman, MD;  Location: [facility]              [facility];  Service: Urology;  Laterality: Left; 02/24/2021: DIALYSIS/PERMA CATHETER INSERTION; N/A     Comment:  Procedure: DIALYSIS/PERMA CATHETER INSERTION;  Surgeon:               Celso College, MD;  Location: [facility];                Service: Cardiovascular;  Laterality: N/A; 12/16/2021: DIALYSIS/PERMA CATHETER INSERTION; N/A     Comment:  Procedure: DIALYSIS/PERMA CATHETER INSERTION;  Surgeon:               Jackquelyn Mass, MD;  Location: [facility];               Service: Cardiovascular;  Laterality: N/A; 09/04/2022: DIALYSIS/PERMA CATHETER REMOVAL; N/A     Comment:  Procedure: DIALYSIS/PERMA CATHETER REMOVAL;  Surgeon:               Celso College, MD;  Location: [facility];                Service: Cardiovascular;  Laterality: N/A; 09/28/2021: PERIPHERAL VASCULAR THROMBECTOMY; Left     Comment:  Procedure: PERIPHERAL VASCULAR THROMBECTOMY;  Surgeon:               Jackquelyn Mass, MD;  Location: [facility];               Service: Cardiovascular;  Laterality: Left; 04/28/2022: REVISON OF ARTERIOVENOUS FISTULA; Left     Comment:  Procedure: REVISON OF ARTERIOVENOUS FISTULA (BRACHIAL               CEPHALIC);  Surgeon: Jackquelyn Mass, MD;  Location:               [facility];  Service: Vascular;  Laterality: Left;  BMI    Body Mass Index: 67.08 kg/m      Reproductive/Obstetrics negative OB ROS                             Anesthesia Physical Anesthesia Plan  ASA: 4  Anesthesia Plan: General   Post-op Pain Management:    Induction: Intravenous  PONV Risk Score and Plan: 1 and Ondansetron  and Dexamethasone   Airway Management Planned: Oral ETT  Additional Equipment:   Intra-op Plan:   Post-operative Plan: Extubation in OR  Informed Consent: I have reviewed the patients History and Physical, chart, labs and discussed the procedure  including the risks, benefits and alternatives for the proposed anesthesia with the patient or authorized representative who has indicated his/her understanding and acceptance.     Dental Advisory Given  Plan Discussed with: CRNA  Anesthesia Plan Comments:        Anesthesia Quick Evaluation

## 2023-07-06 NOTE — Progress Notes (Signed)
 Pt discharged home. MD ok with sat in high 80's. Sats were 88-91% on room air.

## 2023-07-06 NOTE — Op Note (Signed)
 OPERATIVE NOTE   PROCEDURE: Contrast injection left brachiocephalic AV access Percutaneous transluminal angioplasty and stent placement peripheral segment in 2 locations. Percutaneous transluminal angioplasty and stent placement central venous portion Ultrasound-guided access to the left arm brachiocephalic fistula in both the antegrade and retrograde directions  PRE-OPERATIVE DIAGNOSIS: Complication of dialysis access                                                       End Stage Renal Disease  POST-OPERATIVE DIAGNOSIS: same as above   SURGEON: Jackquelyn Mass, M.D.  ANESTHESIA: General  ESTIMATED BLOOD LOSS: minimal  FINDING(S): The area of the previously placed Viabahn stent just above the arterial anastomosis at its leading edge is profoundly diseased with near occlusion and multiple stent fractures.  The trailing edge of the stent more proximally demonstrates a 70 to 75% narrowing.  There is a greater than 90% narrowing of the subclavian cephalic confluence.  The proximal subclavian and innominate vein and superior vena cava are widely patent  SPECIMEN(S):  None  CONTRAST: 35 cc  FLUOROSCOPY TIME: 8.5 minutes  INDICATIONS: JAQUELINNE GLENDENING is a 58 y.o. female who  presents with malfunctioning left arm AV access.  The patient is scheduled for angiography with possible intervention of the AV access.  The patient is aware the risks include but are not limited to: bleeding, infection, thrombosis of the cannulated access, and possible anaphylactic reaction to the contrast.  The patient acknowledges if the access can not be salvaged a tunneled catheter will be needed and will be placed during this procedure.  The patient is aware of the risks of the procedure and elects to proceed with the angiogram and intervention.  DESCRIPTION: After full informed written consent was obtained, the patient was brought back to the Special Procedure suite and placed supine position.  Appropriate  cardiopulmonary monitors were placed.  The left arm was prepped and draped in the standard fashion.  Appropriate timeout is called. The left brachiocephalic AV access was cannulated with a micropuncture needle in the antegrade direction just above the arterial anastomosis.  Cannulation was performed with ultrasound guidance. Ultrasound was placed in a sterile sleeve, the AV access was interrogated and noted to be echolucent and compressible indicating patency. Image was recorded for the permanent record. The puncture is performed under continuous ultrasound visualization.   The microwire was advanced and the needle was exchanged for  a microsheath.  The J-wire was then advanced and a 6 Fr sheath inserted.  Hand injections were completed to image the access from the arterial anastomosis through the entire access.  The central venous structures were also imaged by hand injections.  Based on the images,  4000 units of heparin  was given.   Given the aneurysmal portion of the fistula leading up to the stent which demonstrated profound occlusive disease with the subtotal occlusion associated with significant stent fractures I felt that placing a flared Covera would be the best way to treat this but since the leading edge of the Covera needed to be the flared segment it required placement from the retrograde direction.  The ultrasound was returned to the field and using a hemostat to mark the trailing edge of the stent more proximally a site approximately 1 to 2 cm proximal to the existing Viabahn stent was selected.  It  was echolucent compressible.  This indicated patency.  This was verified with angiography already.  Under direct ultrasound visualization in real-time the microneedle was inserted into the vein followed by the microwire and then a micro sheath.  J-wire was advanced and an 8 [place] sheath was placed.  A supra core wire was then negotiated through the 8 French sheath and then through the diseased segment  and down to the brachial artery.  The sheath placed initially was then repositioned.  A 9 mm x 60 mm flared Covera was then advanced through the 8 French sheath and deployed across the stenotic area deploying the flared segment into the aneurysmal portion of the vein.  This was then postdilated with a 9 x 60 Dorado balloon inflated to 32 atm for 1 minute.  A secondary inflation was made in the midportion of the existing Viabahn stent to 14 atm.  Prior to deploying the stent the wire that was placed through the original sheath in the antegrade direction was removed.  Hand-injection contrast through the 6 French sheath was then performed in the antegrade direction demonstrating the stent was fully expanded and there was less than 10% residual stenosis throughout the segment.  Essentially the stricture had been eliminated and the fractured stents have now been excluded.    Using a Kumpe catheter a J wire the J-wire was now advanced in the antegrade direction through the 6 French sheath and the wire was then advanced under fluoroscopy so that it was positioned with its tip in the central veins.  The 6 French sheath was then exchanged for 7 French sheath the Kumpe reintroduced and the wire exchanged for a V18 wire.  A 9 mm x 50 mm Viabahn stent was then advanced across the second peripheral lesion which included the area that the 8 French sheath had been placed.  Once the wire had been removed from the 8 French sheath the Viabahn stent was deployed and then the 8 [place] sheath was pulled out.  The 9 mm x 50 mm Viabahn stent was then postdilated with the 9 mm balloon that we had already used for the first stent.  Inflation was to 14 atm for approximately 1 minute.  Follow-up imaging in the antegrade direction now demonstrated less than 10% residual stenosis.  Essentially there was elimination of the second peripheral lesion.  Moving more proximally hand injections of contrast were then performed the proximal cephalic  vein was widely patent however at the subclavian cephalic confluence there was a greater than 90% stenosis extending into the subclavian.  Working over the [employer] wire a 9 mm x 50 mm Viabahn stent was selected and then deployed across this lesion.  It was postdilated with a 9 mm x 40 mm Dorado balloon inflated to 18 atm for approximately 1 minute.  Follow-up imaging demonstrated less than 10% residual stenosis with resolution of the central venous stricture.  Follow-up imaging also demonstrated the proximal subclavian and [place]et vein and superior vena cava were widely patent.  Imaging now demonstrates complete resolution of the stricture with rapid flow of contrast through the graft, the central venous anatomy is preserved.  A 4-0 Monocryl purse-string suture was sewn around the 7 [place] sheath and a subcuticular 4-0 Monocryl stitch was used to close the 8 [place] sheath site.  The sheath was removed and light pressure was applied.  A sterile bandage was applied to the puncture site.    COMPLICATIONS: None  CONDITION: Kaylyn Paschal, M.D [facility] Office: [phone]  07/06/2023 2:01 PM

## 2023-07-06 NOTE — Progress Notes (Signed)
 Sats were dropping into 70s on room air after trying to wean O@ from 4L, notified MD.   Gave nebulizer, let her sit up/OOB, did IS. Sats are now remaining 88-90%. Will continue to monitor.

## 2023-07-06 NOTE — Transfer of Care (Signed)
 Immediate Anesthesia Transfer of Care Note  Patient: Nicole Schmidt  Procedure(s) Performed: A/V Fistulagram (Left)  Patient Location: PACU  Anesthesia Type:General  Level of Consciousness: drowsy and patient cooperative  Airway & Oxygen  Therapy: Patient Spontanous Breathing and Patient connected to face mask oxygen   Post-op Assessment: Report given to RN and Post -op Vital signs reviewed and stable  Post vital signs: stable  Last Vitals:  Vitals Value Taken Time  BP 109/60 07/06/23 1407  Temp 36 C 07/06/23 1407  Pulse 118 07/06/23 1411  Resp 11 07/06/23 1411  SpO2 96 % 07/06/23 1411  Vitals shown include unfiled device data.  Last Pain:  Vitals:   07/06/23 1407  TempSrc:   PainSc: 0-No pain         Complications: There were no known notable events for this encounter.

## 2023-07-06 NOTE — Interval H&P Note (Signed)
 History and Physical Interval Note:  07/06/2023 12:17 PM  Nicole Schmidt  has presented today for surgery, with the diagnosis of L Arm Fistulagram    ANESTHESIA    End Stage Renal.  The various methods of treatment have been discussed with the patient and family. After consideration of risks, benefits and other options for treatment, the patient has consented to  Procedure(s): A/V Fistulagram (Left) as a surgical intervention.  The patient's history has been reviewed, patient examined, no change in status, stable for surgery.  I have reviewed the patient's chart and labs.  Questions were answered to the patient's satisfaction.     [name]

## 2023-07-06 NOTE — Anesthesia Procedure Notes (Signed)
 Procedure Name: Intubation Date/Time: 07/06/2023 12:12 PM  Performed by: Coley Davis, CRNAPre-anesthesia Checklist: Patient identified, Emergency Drugs available, Suction available and Patient being monitored Patient Re-evaluated:Patient Re-evaluated prior to induction Oxygen  Delivery Method: Circle system utilized Preoxygenation: Pre-oxygenation with 100% oxygen  Induction Type: IV induction Ventilation: Mask ventilation without difficulty Laryngoscope Size: McGrath and 3 Grade View: Grade I Tube type: Oral Tube size: 6.5 mm Number of attempts: 1 Airway Equipment and Method: Stylet and Oral airway Placement Confirmation: ETT inserted through vocal cords under direct vision, positive ETCO2 and breath sounds checked- equal and bilateral Secured at: 23 cm Tube secured with: Tape Dental Injury: Teeth and Oropharynx as per pre-operative assessment  Comments: Cords clear; no trauma. CA

## 2023-07-10 ENCOUNTER — Emergency Department

## 2023-07-10 ENCOUNTER — Inpatient Hospital Stay
Admission: EM | Admit: 2023-07-10 | Discharge: 2023-07-14 | DRG: 177 | Disposition: A | Discharge status: Home / Self Care | Attending: Internal Medicine | Admitting: Internal Medicine

## 2023-07-10 ENCOUNTER — Encounter: Payer: Self-pay | Admitting: Vascular Surgery

## 2023-07-10 ENCOUNTER — Other Ambulatory Visit: Payer: Self-pay

## 2023-07-10 DIAGNOSIS — E039 Hypothyroidism, unspecified: Secondary | ICD-10-CM | POA: Diagnosis present

## 2023-07-10 DIAGNOSIS — Z923 Personal history of irradiation: Secondary | ICD-10-CM

## 2023-07-10 DIAGNOSIS — G4733 Obstructive sleep apnea (adult) (pediatric): Secondary | ICD-10-CM | POA: Diagnosis present

## 2023-07-10 DIAGNOSIS — I132 Hypertensive heart and chronic kidney disease with heart failure and with stage 5 chronic kidney disease, or end stage renal disease: Secondary | ICD-10-CM | POA: Diagnosis present

## 2023-07-10 DIAGNOSIS — J690 Pneumonitis due to inhalation of food and vomit: Secondary | ICD-10-CM | POA: Diagnosis not present

## 2023-07-10 DIAGNOSIS — Z7901 Long term (current) use of anticoagulants: Secondary | ICD-10-CM

## 2023-07-10 DIAGNOSIS — Z833 Family history of diabetes mellitus: Secondary | ICD-10-CM

## 2023-07-10 DIAGNOSIS — D631 Anemia in chronic kidney disease: Secondary | ICD-10-CM | POA: Diagnosis present

## 2023-07-10 DIAGNOSIS — Z86711 Personal history of pulmonary embolism: Secondary | ICD-10-CM | POA: Diagnosis present

## 2023-07-10 DIAGNOSIS — Z6841 Body Mass Index (BMI) 40.0 and over, adult: Secondary | ICD-10-CM | POA: Diagnosis not present

## 2023-07-10 DIAGNOSIS — J90 Pleural effusion, not elsewhere classified: Secondary | ICD-10-CM

## 2023-07-10 DIAGNOSIS — I4891 Unspecified atrial fibrillation: Secondary | ICD-10-CM | POA: Diagnosis present

## 2023-07-10 DIAGNOSIS — Z634 Disappearance and death of family member: Secondary | ICD-10-CM

## 2023-07-10 DIAGNOSIS — I10 Essential (primary) hypertension: Secondary | ICD-10-CM | POA: Diagnosis present

## 2023-07-10 DIAGNOSIS — Z992 Dependence on renal dialysis: Secondary | ICD-10-CM

## 2023-07-10 DIAGNOSIS — Z79890 Hormone replacement therapy (postmenopausal): Secondary | ICD-10-CM | POA: Diagnosis not present

## 2023-07-10 DIAGNOSIS — Z9981 Dependence on supplemental oxygen: Secondary | ICD-10-CM

## 2023-07-10 DIAGNOSIS — Z853 Personal history of malignant neoplasm of breast: Secondary | ICD-10-CM

## 2023-07-10 DIAGNOSIS — Z1152 Encounter for screening for COVID-19: Secondary | ICD-10-CM

## 2023-07-10 DIAGNOSIS — E785 Hyperlipidemia, unspecified: Secondary | ICD-10-CM | POA: Diagnosis not present

## 2023-07-10 DIAGNOSIS — E119 Type 2 diabetes mellitus without complications: Secondary | ICD-10-CM

## 2023-07-10 DIAGNOSIS — Z820 Family history of epilepsy and other diseases of the nervous system: Secondary | ICD-10-CM

## 2023-07-10 DIAGNOSIS — R0602 Shortness of breath: Secondary | ICD-10-CM | POA: Diagnosis not present

## 2023-07-10 DIAGNOSIS — Z8249 Family history of ischemic heart disease and other diseases of the circulatory system: Secondary | ICD-10-CM

## 2023-07-10 DIAGNOSIS — N186 End stage renal disease: Secondary | ICD-10-CM | POA: Diagnosis present

## 2023-07-10 DIAGNOSIS — J9621 Acute and chronic respiratory failure with hypoxia: Secondary | ICD-10-CM | POA: Diagnosis present

## 2023-07-10 DIAGNOSIS — J984 Other disorders of lung: Secondary | ICD-10-CM

## 2023-07-10 DIAGNOSIS — J440 Chronic obstructive pulmonary disease with acute lower respiratory infection: Secondary | ICD-10-CM | POA: Diagnosis present

## 2023-07-10 DIAGNOSIS — J189 Pneumonia, unspecified organism: Secondary | ICD-10-CM | POA: Diagnosis not present

## 2023-07-10 DIAGNOSIS — E1165 Type 2 diabetes mellitus with hyperglycemia: Secondary | ICD-10-CM | POA: Diagnosis not present

## 2023-07-10 DIAGNOSIS — I5031 Acute diastolic (congestive) heart failure: Secondary | ICD-10-CM | POA: Diagnosis not present

## 2023-07-10 DIAGNOSIS — Z888 Allergy status to other drugs, medicaments and biological substances status: Secondary | ICD-10-CM

## 2023-07-10 DIAGNOSIS — Z79899 Other long term (current) drug therapy: Secondary | ICD-10-CM

## 2023-07-10 DIAGNOSIS — I5030 Unspecified diastolic (congestive) heart failure: Secondary | ICD-10-CM | POA: Insufficient documentation

## 2023-07-10 DIAGNOSIS — R000 Tachycardia, unspecified: Secondary | ICD-10-CM | POA: Diagnosis present

## 2023-07-10 DIAGNOSIS — Z8261 Family history of arthritis: Secondary | ICD-10-CM

## 2023-07-10 DIAGNOSIS — I5033 Acute on chronic diastolic (congestive) heart failure: Secondary | ICD-10-CM | POA: Diagnosis not present

## 2023-07-10 DIAGNOSIS — Z7951 Long term (current) use of inhaled steroids: Secondary | ICD-10-CM | POA: Diagnosis not present

## 2023-07-10 DIAGNOSIS — J9622 Acute and chronic respiratory failure with hypercapnia: Secondary | ICD-10-CM | POA: Diagnosis present

## 2023-07-10 DIAGNOSIS — I517 Cardiomegaly: Secondary | ICD-10-CM | POA: Diagnosis not present

## 2023-07-10 DIAGNOSIS — E1122 Type 2 diabetes mellitus with diabetic chronic kidney disease: Secondary | ICD-10-CM | POA: Diagnosis present

## 2023-07-10 DIAGNOSIS — Z7982 Long term (current) use of aspirin: Secondary | ICD-10-CM

## 2023-07-10 DIAGNOSIS — I4821 Permanent atrial fibrillation: Secondary | ICD-10-CM | POA: Diagnosis not present

## 2023-07-10 DIAGNOSIS — J449 Chronic obstructive pulmonary disease, unspecified: Secondary | ICD-10-CM

## 2023-07-10 DIAGNOSIS — R918 Other nonspecific abnormal finding of lung field: Secondary | ICD-10-CM | POA: Diagnosis not present

## 2023-07-10 DIAGNOSIS — J9601 Acute respiratory failure with hypoxia: Secondary | ICD-10-CM | POA: Diagnosis present

## 2023-07-10 DIAGNOSIS — D519 Vitamin B12 deficiency anemia, unspecified: Secondary | ICD-10-CM | POA: Diagnosis present

## 2023-07-10 DIAGNOSIS — Z800 Family history of malignant neoplasm of digestive organs: Secondary | ICD-10-CM

## 2023-07-10 DIAGNOSIS — E1169 Type 2 diabetes mellitus with other specified complication: Secondary | ICD-10-CM

## 2023-07-10 DIAGNOSIS — Z87442 Personal history of urinary calculi: Secondary | ICD-10-CM

## 2023-07-10 LAB — BASIC METABOLIC PANEL WITH GFR
Anion gap: 12 (ref 5–15)
BUN: 34 mg/dL — ABNORMAL HIGH (ref 6–20)
CO2: 30 mmol/L (ref 22–32)
Calcium: 8.7 mg/dL — ABNORMAL LOW (ref 8.9–10.3)
Chloride: 94 mmol/L — ABNORMAL LOW (ref 98–111)
Creatinine, Ser: 5.54 mg/dL — ABNORMAL HIGH (ref 0.44–1.00)
GFR, Estimated: 8 mL/min — ABNORMAL LOW (ref >60)
Glucose, Bld: 189 mg/dL — ABNORMAL HIGH (ref 70–99)
Potassium: 4.1 mmol/L (ref 3.5–5.1)
Sodium: 136 mmol/L (ref 135–145)

## 2023-07-10 LAB — GLUCOSE, CAPILLARY: Glucose-Capillary: 169 mg/dL — ABNORMAL HIGH (ref 70–99)

## 2023-07-10 LAB — CBC
HCT: 36.6 % (ref 36.0–46.0)
Hemoglobin: 11.6 g/dL — ABNORMAL LOW (ref 12.0–15.0)
MCH: 36.3 pg — ABNORMAL HIGH (ref 26.0–34.0)
MCHC: 31.7 g/dL (ref 30.0–36.0)
MCV: 114.4 fL — ABNORMAL HIGH (ref 80.0–100.0)
Platelets: 177 10*3/uL (ref 150–400)
RBC: 3.2 MIL/uL — ABNORMAL LOW (ref 3.87–5.11)
RDW: 14.7 % (ref 11.5–15.5)
WBC: 10.2 10*3/uL (ref 4.0–10.5)
nRBC: 0.2 % (ref 0.0–0.2)

## 2023-07-10 LAB — RESP PANEL BY RT-PCR (RSV, FLU A&B, COVID)  RVPGX2
Influenza A by PCR: NEGATIVE
Influenza B by PCR: NEGATIVE
Resp Syncytial Virus by PCR: NEGATIVE
SARS Coronavirus 2 by RT PCR: NEGATIVE

## 2023-07-10 LAB — BRAIN NATRIURETIC PEPTIDE: B Natriuretic Peptide: 221.3 pg/mL — ABNORMAL HIGH (ref 0.0–100.0)

## 2023-07-10 MED ORDER — SODIUM CHLORIDE 0.9 % IV SOLN
1.0000 g | Freq: Once | INTRAVENOUS | Status: AC
  Administered 2023-07-10: 1 g via INTRAVENOUS
  Filled 2023-07-10: qty 10

## 2023-07-10 MED ORDER — INSULIN ASPART 100 UNIT/ML IJ SOLN
0.0000 [IU] | Freq: Every day | INTRAMUSCULAR | Status: DC
  Administered 2023-07-11: 3 [IU] via SUBCUTANEOUS
  Administered 2023-07-12: 2 [IU] via SUBCUTANEOUS
  Filled 2023-07-10 (×2): qty 1

## 2023-07-10 MED ORDER — METOPROLOL TARTRATE 5 MG/5ML IV SOLN
5.0000 mg | Freq: Once | INTRAVENOUS | Status: AC
  Administered 2023-07-10: 5 mg via INTRAVENOUS
  Filled 2023-07-10: qty 5

## 2023-07-10 MED ORDER — CALCIUM ACETATE (PHOS BINDER) 667 MG PO CAPS: 667.0000 mg | ORAL_CAPSULE | Freq: Three times a day (TID) | ORAL | Status: DC

## 2023-07-10 MED ORDER — SODIUM CHLORIDE 0.9 % IV SOLN
500.0000 mg | Freq: Once | INTRAVENOUS | Status: AC
  Administered 2023-07-11: 500 mg via INTRAVENOUS
  Filled 2023-07-10 (×2): qty 5

## 2023-07-10 MED ORDER — SODIUM CHLORIDE 0.9% FLUSH: 3.0000 mL | INTRAVENOUS | Status: DC | PRN

## 2023-07-10 MED ORDER — SODIUM CHLORIDE 0.9 % IV SOLN: 250.0000 mL | INTRAVENOUS | Status: AC | PRN

## 2023-07-10 MED ORDER — HYDRALAZINE HCL 20 MG/ML IJ SOLN: 5.0000 mg | INTRAMUSCULAR | Status: DC | PRN

## 2023-07-10 MED ORDER — ASPIRIN 81 MG PO TBEC
81.0000 mg | DELAYED_RELEASE_TABLET | Freq: Every day | ORAL | Status: DC
  Administered 2023-07-11 – 2023-07-14 (×4): 81 mg via ORAL
  Filled 2023-07-10 (×4): qty 1

## 2023-07-10 MED ORDER — ATORVASTATIN CALCIUM 20 MG PO TABS
40.0000 mg | ORAL_TABLET | Freq: Every day | ORAL | Status: DC
  Administered 2023-07-11 – 2023-07-14 (×4): 40 mg via ORAL
  Filled 2023-07-10 (×4): qty 2

## 2023-07-10 MED ORDER — INSULIN ASPART 100 UNIT/ML IJ SOLN
0.0000 [IU] | Freq: Three times a day (TID) | INTRAMUSCULAR | Status: DC
  Administered 2023-07-11: 7 [IU] via SUBCUTANEOUS
  Administered 2023-07-11 – 2023-07-13 (×4): 3 [IU] via SUBCUTANEOUS
  Administered 2023-07-13 (×2): 4 [IU] via SUBCUTANEOUS
  Administered 2023-07-14: 3 [IU] via SUBCUTANEOUS
  Filled 2023-07-10 (×8): qty 1

## 2023-07-10 MED ORDER — ACETAMINOPHEN 325 MG PO TABS
650.0000 mg | ORAL_TABLET | ORAL | Status: DC | PRN
  Administered 2023-07-12 – 2023-07-13 (×3): 650 mg via ORAL
  Filled 2023-07-10 (×3): qty 2

## 2023-07-10 MED ORDER — FLUTICASONE FUROATE-VILANTEROL 100-25 MCG/ACT IN AEPB
1.0000 | INHALATION_SPRAY | Freq: Every day | RESPIRATORY_TRACT | Status: DC
  Administered 2023-07-11 – 2023-07-14 (×4): 1 via RESPIRATORY_TRACT
  Filled 2023-07-10: qty 28

## 2023-07-10 MED ORDER — FUROSEMIDE 10 MG/ML IJ SOLN
40.0000 mg | Freq: Once | INTRAMUSCULAR | Status: DC
  Filled 2023-07-10: qty 4

## 2023-07-10 MED ORDER — GABAPENTIN 300 MG PO CAPS
300.0000 mg | ORAL_CAPSULE | Freq: Every day | ORAL | Status: DC
  Administered 2023-07-11 – 2023-07-13 (×4): 300 mg via ORAL
  Filled 2023-07-10 (×4): qty 1

## 2023-07-10 MED ORDER — APIXABAN 5 MG PO TABS
5.0000 mg | ORAL_TABLET | Freq: Two times a day (BID) | ORAL | Status: DC
  Administered 2023-07-11 – 2023-07-14 (×8): 5 mg via ORAL
  Filled 2023-07-10 (×8): qty 1

## 2023-07-10 MED ORDER — SODIUM CHLORIDE 0.9% FLUSH
3.0000 mL | Freq: Two times a day (BID) | INTRAVENOUS | Status: DC
  Administered 2023-07-11 – 2023-07-14 (×8): 3 mL via INTRAVENOUS

## 2023-07-10 MED ORDER — CINACALCET HCL 30 MG PO TABS
30.0000 mg | ORAL_TABLET | Freq: Every day | ORAL | Status: DC
  Administered 2023-07-11 – 2023-07-14 (×4): 30 mg via ORAL
  Filled 2023-07-10 (×4): qty 1

## 2023-07-10 MED ORDER — METOPROLOL SUCCINATE ER 25 MG PO TB24
25.0000 mg | ORAL_TABLET | Freq: Every day | ORAL | Status: DC
  Administered 2023-07-11 – 2023-07-13 (×4): 25 mg via ORAL
  Filled 2023-07-10 (×4): qty 1

## 2023-07-10 MED ORDER — HEPARIN SODIUM (PORCINE) 5000 UNIT/ML IJ SOLN: 5000.0000 [IU] | Freq: Three times a day (TID) | INTRAMUSCULAR | Status: DC

## 2023-07-10 MED ORDER — LEVOTHYROXINE SODIUM 112 MCG PO TABS
112.0000 ug | ORAL_TABLET | Freq: Every day | ORAL | Status: DC
  Administered 2023-07-11 – 2023-07-14 (×4): 112 ug via ORAL
  Filled 2023-07-10 (×5): qty 1

## 2023-07-10 MED ORDER — ONDANSETRON HCL 4 MG/2ML IJ SOLN
4.0000 mg | Freq: Four times a day (QID) | INTRAMUSCULAR | Status: DC | PRN
  Administered 2023-07-12 – 2023-07-13 (×2): 4 mg via INTRAVENOUS
  Filled 2023-07-10 (×2): qty 2

## 2023-07-10 NOTE — Assessment & Plan Note (Signed)
 Continue clonidine , metoprolol 

## 2023-07-10 NOTE — Assessment & Plan Note (Addendum)
 Had full session of dialysis on the day of arrival with no missed or shortened sessions Nephrology consult for continuation of dialysis

## 2023-07-10 NOTE — Assessment & Plan Note (Addendum)
 Suspecting mild acute exacerbation triggered by pneumonia, or possible fluid administration during her vascular procedure on 5/23 Patient has baseline sleeps sitting upright in recliner.  No increased lower extremity edema but BNP 221 Chest x-ray showed possible pleural effusion Last EF 55 to 60% in April 2023 Continue metoprolol  IV Lasix  x 1, otherwise should improve with home hemodialysis Daily weights with intake and output monitoring Nephrology consulted for continuation of dialysis and extra session if needed Echo ordered

## 2023-07-10 NOTE — Assessment & Plan Note (Signed)
 Chronic anticoagulation Rate in the 1 teens to 130s Likely driven by coughing and respiratory failure, so likely physiologic response Will give a dose of IV metoprolol  then continue home metoprolol  Continue Eliquis  Continuous cardiac monitoring

## 2023-07-10 NOTE — ED Triage Notes (Addendum)
 Cough for the past few days and productive cough. Junky lung sound. Hx of PE. Pt spo2 at home was approx in the 80's on RA but extertion present. Pt was placed on 4 liter per min in the upper 90s per EMS. Pt sts her Hemodialysis days are Tues, Thursday, and Sat with last Dialysis today with full treatment

## 2023-07-10 NOTE — H&P (Incomplete Revision)
 History and Physical    Patient: Nicole Schmidt:[id number] DOB: [birth year] DOA: 07/10/2023 DOS: the patient was seen and examined on 07/10/2023 PCP: Eartha Gold, MD  Patient coming from: Home  Chief Complaint:  Chief Complaint  Patient presents with   Shortness of Breath    HPI: [name] is a 58 y.o. female with medical history significant for History of breast cancer, COPD with chronic respiratory failure previously requiring home trilogy, ESRD on HD( TTS), HFpEF (EF 55-60 05/2021), hypothyroidism, diabetes mellitus, hypertension, permanent A-fib with prior cardioversion, on Eliquis , history of PE, and morbid obesity being admitted with acute on chronic hypoxia secondary to possible pneumonia and possible CHF as well as rapid A-fib.  She presented to the ED by EMS with a several day history of productive cough with shortness of breath beyond her baseline with O2 sats in the 80s on room air at home.  She denied fever or chills.  Denied chest pain, denied lower extremity pain.  Denied lower extremity swelling beyond her baseline.  She was placed on 4 L for transport. Patient was last evaluated by her cardiologist in January 2025 when she was doing well. ED course and data review: On arrival tachycardic to the 120s tachypneic to the upper 20s requiring 4 L to maintain sats in the high 90s.  Initial BP 142/49. Labs notable for BNP 221, troponin pending Basic metabolic panel in keeping with dialysis status and with normal potassium and bicarb.  CBC unremarkable with  anemia of 11.6, baseline 12-13  EKG, personally reviewed and interpreted showing A-fib at 133 with multiple PACs  Chest x-ray showing left lower lung zone airspace opacity. May represent a combination of pulmonary disease and pleural effusion recommending AP and lateral chest  Started on Rocephin  and azithromycin   Hospitalist consulted for admission.     Review of Systems: As mentioned in the history of present  illness. All other systems reviewed and are negative.  Past Medical History:  Diagnosis Date   (HFpEF) heart failure with preserved ejection fraction (HCC)    a. 10/2018 Echo: EF 60-65%, diast dysfxn, RVSP 50.7 mmHg; b. 01/2020 Echo: EF 65-70%, G2DD; c. 01/2021 Echo: EF 60-65%, no rwma, mod LVH. Nl RV fxn, mildly enlarged RV. Mod BAE. Triv MR. d. 05/2021 Echo: EF 55-60; no RWMAs, mild LVH, mild TR.   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 Er/PR pos, her 2 negative.  Margins 1 mm.   COPD (chronic obstructive pulmonary disease) (HCC)    Diabetes mellitus type 2 in obese 02/09/2020   Difficult intubation 09/28/2021   a.) mallampati III-IV; BMI >55 kg/m   Diverticulosis    Dyslipidemia 02/09/2020   ESBL (extended spectrum beta-lactamase) producing bacteria infection 03/20/2021   a. urine culture (+) for ESBL producing E.coli and P. mirabilis   ESRD (end stage renal disease) on dialysis (HCC)    a. T-Th-Sat HD initiated 02/2021   Essential hypertension    Gout    History of kidney stones    Long term current use of anticoagulant    a.) apixaban    Menopause    age 57   Morbid obesity (HCC)    OSA treated with BiPAP    a. on nocturnal PAP therapy;  Trilogy 100 ventilator   Permanent atrial fibrillation (HCC)    a. Dx 10/2018 in setting of PNA. b. CHA2DS2-VASc = 7 (sex, HFpEF, HTN, PE x2, aortic plaque, T2DM); c. s/p successful DCCV 11/2018. d. subsequent recurrent Afib; rate/rhythm maintained on oral metoprolol  succinate; chronically anticoagulated using standard dose apixaban .   Personal history of radiation therapy    Pulmonary embolism (HCC) 10/2014   a. on chronic apixaban    Restrictive lung disease    Shingles 10/24/2021   Sleep difficulties    a.) takes melatonin   Thyroid   goiter    Past Surgical History:  Procedure Laterality Date   A/V FISTULAGRAM Left 09/02/2021   Procedure: A/V Fistulagram;  Surgeon: Jackquelyn Mass, MD;  Location: [facility];  Service: Cardiovascular;  Laterality: Left;   A/V FISTULAGRAM Left 06/27/2022   Procedure: A/V Fistulagram;  Surgeon: Jackquelyn Mass, MD;  Location: [facility];  Service: Cardiovascular;  Laterality: Left;   A/V FISTULAGRAM Left 01/05/2023   Procedure: A/V Fistulagram;  Surgeon: Jackquelyn Mass, MD;  Location: [facility];  Service: Cardiovascular;  Laterality: Left;   A/V FISTULAGRAM Left 07/06/2023   Procedure: A/V Fistulagram;  Surgeon: Jackquelyn Mass, MD;  Location: [facility];  Service: Cardiovascular;  Laterality: Left;   AV FISTULA PLACEMENT Left 07/22/2021   Procedure: ARTERIOVENOUS (AV) FISTULA CREATION (RADIALCEPHALIC);  Surgeon: Jackquelyn Mass, MD;  Location: [facility];  Service: Vascular;  Laterality: Left;   AV FISTULA PLACEMENT Left 12/09/2021   Procedure: ARTERIOVENOUS (AV) FISTULA CREATION (BRACHIAL CEPHALIC);  Surgeon: Jackquelyn Mass, MD;  Location: [facility];  Service: Vascular;  Laterality: Left;   BREAST BIOPSY Right 2014   breast ca   BREAST EXCISIONAL BIOPSY Right 07/19/2012   breast ca   BREAST SURGERY Right 2014   with sentinel node bx subareaolar duct excision   CARDIOVERSION N/A 11/28/2018   Procedure: CARDIOVERSION;  Surgeon: Devorah Fonder, MD;  Location: [facility];  Service: Cardiovascular;  Laterality: N/A;   COLONOSCOPY WITH PROPOFOL  N/A 01/30/2017   Procedure: COLONOSCOPY WITH PROPOFOL ;  Surgeon: Jerlean Mood, MD;  Location: [facility];  Service: Endoscopy;  Laterality: N/A;   CYSTOSCOPY W/ RETROGRADES  06/17/2021   Procedure: CYSTOSCOPY WITH RETROGRADE PYELOGRAM;  Surgeon: Lawerence Pressman, MD;  Location: [facility];  Service: Urology;;   CYSTOSCOPY W/ URETERAL STENT PLACEMENT Left 02/14/2021   Procedure: CYSTOSCOPY  WITH RETROGRADE PYELOGRAM/URETERAL STENT PLACEMENT;  Surgeon: Lawerence Pressman, MD;  Location: [facility];  Service: Urology;  Laterality: Left;   CYSTOSCOPY/URETEROSCOPY/HOLMIUM LASER/STENT PLACEMENT Left 06/17/2021   Procedure: CYSTOSCOPY/URETEROSCOPY/HOLMIUM LASER/STENT EXCHANGE;  Surgeon: Lawerence Pressman, MD;  Location: [facility];  Service: Urology;  Laterality: Left;   DIALYSIS/PERMA CATHETER INSERTION N/A 02/24/2021   Procedure: DIALYSIS/PERMA CATHETER INSERTION;  Surgeon: Celso College, MD;  Location: [facility];  Service: Cardiovascular;  Laterality: N/A;   DIALYSIS/PERMA CATHETER INSERTION N/A 12/16/2021   Procedure: DIALYSIS/PERMA CATHETER INSERTION;  Surgeon: Jackquelyn Mass, MD;  Location: [facility];  Service: Cardiovascular;  Laterality: N/A;   DIALYSIS/PERMA CATHETER REMOVAL N/A 09/04/2022   Procedure: DIALYSIS/PERMA CATHETER REMOVAL;  Surgeon: Celso College, MD;  Location: [facility];  Service: Cardiovascular;  Laterality: N/A;   PERIPHERAL VASCULAR THROMBECTOMY Left 09/28/2021   Procedure: PERIPHERAL VASCULAR THROMBECTOMY;  Surgeon: Jackquelyn Mass, MD;  Location: [facility];  Service: Cardiovascular;  Laterality: Left;   REVISON OF ARTERIOVENOUS FISTULA Left 04/28/2022  Procedure: REVISON OF ARTERIOVENOUS FISTULA (BRACHIAL CEPHALIC);  Surgeon: Jackquelyn Mass, MD;  Location: [facility];  Service: Vascular;  Laterality: Left;   Social History:  reports that she has never smoked. She has never been exposed to tobacco smoke. She has never used smokeless tobacco. She reports that she does not drink alcohol and does not use drugs.  Allergies  Allergen Reactions   Spironolactone      Hyperkalemia  Pt unaware of this allergy    Family History  Problem Relation Age of Onset   Diabetes Father    Hypertension Father    Parkinson's disease Father    Hypertension Mother    Rheum arthritis Mother    Atrial fibrillation Mother    Heart failure  Mother    Heart attack Mother        died in her mid-70's.   Colon cancer Cousin 54   Breast cancer Neg Hx     Prior to Admission medications   Medication Sig Start Date End Date Taking? Authorizing Provider  acetaminophen  (TYLENOL ) 325 MG tablet Take 2 tablets (650 mg total) by mouth every 6 (six) hours as needed for mild pain (or Fever >/= 101). 03/15/21   Angiulli, Everlyn Hockey, PA-C  albuterol  (VENTOLIN  HFA) 108 (90 Base) MCG/ACT inhaler Inhale 2 puffs into the lungs every 6 (six) hours as needed for wheezing or shortness of breath. 05/25/23   Cobb, Mariah Shines, NP  apixaban  (ELIQUIS ) 5 MG TABS tablet Take 1 tablet (5 mg total) by mouth 2 (two) times daily. 04/11/23   Avonne Boettcher, MD  aspirin  EC 81 MG tablet Take 1 tablet (81 mg total) by mouth daily. Swallow whole. 01/05/23 01/05/24  Schnier, Ninette Basque, MD  atorvastatin  (LIPITOR) 40 MG tablet Take 40 mg by mouth daily.    [provider]  calcium  acetate (PHOSLO) 667 MG capsule Take 667 mg by mouth 3 (three) times daily. 11/09/22   [provider]  cinacalcet (SENSIPAR) 30 MG tablet Take 30 mg by mouth daily. 11/26/22   [provider]  cloNIDine  (CATAPRES ) 0.1 MG tablet Take 1 tablet (0.1 mg total) by mouth 2 (two) times daily. 07/26/22   Wenona Hamilton, MD  CRANBERRY PO Take 1 capsule by mouth daily.    [provider]  ezetimibe (ZETIA) 10 MG tablet Take 10 mg by mouth daily with supper.    [provider]  ferric gluconate 250 mg in sodium chloride  0.9 % 250 mL Inject 250 mg into the vein Every Tuesday,Thursday,and Saturday with dialysis. 04/12/21   Angiulli, Everlyn Hockey, PA-C  fluticasone -salmeterol (ADVAIR DISKUS) 100-50 MCG/ACT AEPB Inhale 1 puff into the lungs 2 (two) times daily. 11/24/22   Antonio Baumgarten, NP  gabapentin  (NEURONTIN ) 300 MG capsule Take 1 capsule (300 mg total) by mouth at bedtime. 04/12/21   Angiulli, Everlyn Hockey, PA-C  Lancets [facility] DELICA PLUS [place]) MISC  05/17/21    [provider]  levothyroxine  (SYNTHROID ) 112 MCG tablet Take 1 tablet (112 mcg total) by mouth daily before breakfast. 04/12/21   Angiulli, Everlyn Hockey, PA-C  melatonin 5 MG TABS Take 2 tablets (10 mg total) by mouth at bedtime as needed (sleep). 04/12/21   Angiulli, Everlyn Hockey, PA-C  metoprolol  succinate (TOPROL -XL) 25 MG 24 hr tablet Take 1 tablet (25 mg total) by mouth at bedtime. Take with or immediately following a meal. 07/26/22   Wenona Hamilton, MD  midodrine  (PROAMATINE ) 10 MG tablet Take 1 tablet (10 mg total)  by mouth Every Tuesday,Thursday,and Saturday with dialysis. 04/12/21   Angiulli, Daniel J, PA-C  multivitamin (RENA-VIT) TABS tablet Take 1 tablet by mouth daily. 01/30/23   [provider]  [facility] ULTRA test strip  05/17/21   [provider]  polyethylene glycol powder (GLYCOLAX /MIRALAX ) 17 GM/SCOOP powder Take 1 Container by mouth once. Patient not taking: Reported on 07/02/2023    [provider]  TRULICITY  1.5 MG/0.5ML SOPN Inject 1.5 mg into the skin every Monday. 05/17/21   [provider]  VELPHORO 500 MG chewable tablet Chew 1,000 mg by mouth 3 (three) times daily. 02/07/22   [provider]    Physical Exam: Vitals:   07/10/23 2030 07/10/23 2030 07/10/23 2100 07/10/23 2200  BP: (!) 142/49 (!) 177/98 (!) 108/55 100/64  Pulse: (!) 119  (!) 119 (!) 124  Resp: (!) 26  (!) 28 (!) 21  Temp:      TempSrc:      SpO2: 97%  100% 100%  Weight:      Height:       Physical Exam  Labs on Admission: I have personally reviewed following labs and imaging studies  CBC: Recent Labs  Lab 07/10/23 2025  WBC 10.2  HGB 11.6*  HCT 36.6  MCV 114.4*  PLT 177   Basic Metabolic Panel: Recent Labs  Lab 07/10/23 2025  NA 136  K 4.1  CL 94*  CO2 30  GLUCOSE 189*  BUN 34*  CREATININE 5.54*  CALCIUM  8.7*   GFR: Estimated Creatinine Clearance: 16.2 mL/min (A) (by C-G formula based on SCr of 5.54 mg/dL (H)). Liver Function  Tests: No results for input(s): "AST", "ALT", "ALKPHOS", "BILITOT", "PROT", "ALBUMIN " in the last 168 hours. No results for input(s): "LIPASE", "AMYLASE" in the last 168 hours. No results for input(s): "AMMONIA" in the last 168 hours. Coagulation Profile: No results for input(s): "INR", "PROTIME" in the last 168 hours. Cardiac Enzymes: No results for input(s): "CKTOTAL", "CKMB", "CKMBINDEX", "TROPONINI" in the last 168 hours. BNP (last 3 results) No results for input(s): "PROBNP" in the last 8760 hours. HbA1C: No results for input(s): "HGBA1C" in the last 72 hours. CBG: Recent Labs  Lab 07/06/23 1054 07/06/23 1419 07/06/23 1519  GLUCAP 139* 169* 159*   Lipid Profile: No results for input(s): "CHOL", "HDL", "LDLCALC", "TRIG", "CHOLHDL", "LDLDIRECT" in the last 72 hours. Thyroid  Function Tests: No results for input(s): "TSH", "T4TOTAL", "FREET4", "T3FREE", "THYROIDAB" in the last 72 hours. Anemia Panel: No results for input(s): "VITAMINB12", "FOLATE", "FERRITIN", "TIBC", "IRON ", "RETICCTPCT" in the last 72 hours. Urine analysis:    Component Value Date/Time   COLORURINE YELLOW 04/06/2021 1825   APPEARANCEUR Cloudy (A) 06/08/2021 1401   LABSPEC 1.025 04/06/2021 1825   PHURINE 5.5 04/06/2021 1825   GLUCOSEU Negative 06/08/2021 1401   HGBUR LARGE (A) 04/06/2021 1825   BILIRUBINUR Negative 06/08/2021 1401   KETONESUR NEGATIVE 04/06/2021 1825   PROTEINUR 2+ (A) 06/08/2021 1401   PROTEINUR 100 (A) 04/06/2021 1825   NITRITE Negative 06/08/2021 1401   NITRITE NEGATIVE 04/06/2021 1825   LEUKOCYTESUR 2+ (A) 06/08/2021 1401   LEUKOCYTESUR MODERATE (A) 04/06/2021 1825    Radiological Exams on Admission: DG Chest 1 View Result Date: 07/10/2023 CLINICAL DATA:  10026 Shortness of breath 10026 EXAM: CHEST  1 VIEW COMPARISON:  CT chest 02/09/2020, chest x-ray 01/28/2023 FINDINGS: Cardiomegaly. The heart and mediastinal contours are unchanged given AP portable technique. Atherosclerotic  plaque. No focal consolidation. No pulmonary edema. Left lower lung zone airspace opacity. No  pneumothorax. No acute osseous abnormality. IMPRESSION: Left lower lung zone airspace opacity. Finding may represent combination of pulmonary disease and pleural effusion. Recommend repeat chest x-ray PA and lateral view of the chest for further evaluation. Electronically Signed   By: Morgane  Naveau M.D.   On: 07/10/2023 20:50   Data Reviewed for HPI: Relevant notes from primary care and specialist visits, past discharge summaries as available in EHR, including Care Everywhere. Prior diagnostic testing as pertinent to current admission diagnoses Updated medications and problem lists for reconciliation ED course, including vitals, labs, imaging, treatment and response to treatment Triage notes, nursing and pharmacy notes and ED provider's notes Notable results as noted above in HPI      Assessment and Plan: * CAP (community acquired pneumonia) Acute on chronic respiratory failure with hypoxia, previously on Trelegy COPD and restrictive lung disease Patient with productive cough, normal labs but chest x-ray with left lower lung opacity Continue Rocephin  and azithromycin  started in the emergency room Continue home inhalers DuoNebs as needed Antitussives, incentive spirometer and flutter valve Continue supplemental oxygen  to keep sats over 90  Atrial fibrillation with rapid ventricular response (HCC) Chronic anticoagulation Rate in the 1 teens to 130s Likely driven by coughing and respiratory failure, so likely physiologic response Will give a dose of IV metoprolol  then continue home metoprolol  Continue Eliquis  Continuous cardiac monitoring   Acute on chronic heart failure with preserved ejection fraction (HFpEF) (HCC) Suspecting mild acute exacerbation triggered by pneumonia and rapid A-fib Last EF 55 to 60% in April 2023 Continue metoprolol  IV Lasix  x 1, otherwise should improve with  home hemodialysis Daily weights with intake and output monitoring Nephrology consulted for continuation of dialysis and extra session if needed  History of pulmonary embolus (PE) Currently on Eliquis  Low suspicion for PE as patient denies chest pain and is compliant with Eliquis   ESRD on hemodialysis (HCC) Had full session of dialysis on the day of arrival with no missed or shortened sessions Nephrology consult for continuation of dialysis  Diabetes mellitus, type II (HCC) Sliding scale insulin  coverage  Hypothyroidism Continue levothyroxine  112 mcg  Morbid obesity with BMI of 60.0-69.9, adult (HCC) Complicating factor to overall prognosis and care  Essential hypertension Continue clonidine , metoprolol   History of breast cancer No acute issues suspected    DVT prophylaxis: Eliquis   Consults: none  Advance Care Planning:   Code Status: Prior   Family Communication: none  Disposition Plan: Back to previous home environment  Severity of Illness: The appropriate patient status for this patient is OBSERVATION. Observation status is judged to be reasonable and necessary in order to provide the required intensity of service to ensure the patient's safety. The patient's presenting symptoms, physical exam findings, and initial radiographic and laboratory data in the context of their medical condition is felt to place them at decreased risk for further clinical deterioration. Furthermore, it is anticipated that the patient will be medically stable for discharge from the hospital within 2 midnights of admission.   Author: Lanetta Pion, MD 07/10/2023 10:39 PM  For on call review [email or web address].

## 2023-07-10 NOTE — Assessment & Plan Note (Signed)
 Complicating factor to overall prognosis and care

## 2023-07-10 NOTE — ED Provider Notes (Signed)
 [facility] Provider Note    Event Date/Time   First MD Initiated Contact with Patient 07/10/23 2009     (approximate)   History   Shortness of Breath   HPI  Nicole HISE is a 58 y.o. female with a history of atrial fibrillation, HFpEF, ESRD on dialysis, hypertension, PE on Eliquis , and OSA who presents with shortness of breath, gradual onset over the last several days, and then worsening today when she was at dialysis, associated with productive cough.  The patient denies any chest pain.  She denies any acute leg swelling.  She has no fever or chills.  She states that her dialysis session today proceeded normally.  She states that the respiratory symptoms started after she had anesthesia for a vascular procedure last week.  I reviewed the past medical records.  The patient's most recent prior encounter was with vascular surgery on 5/23 for an angioplasty and stent placement for her left arm AV access.   Physical Exam   Triage Vital Signs: ED Triage Vitals [07/10/23 2014]  Encounter Vitals Group     BP      Systolic BP Percentile      Diastolic BP Percentile      Pulse      Resp (!) 26     Temp      Temp src      SpO2      Weight      Height      Head Circumference      Peak Flow      Pain Score      Pain Loc      Pain Education      Exclude from Growth Chart     Most recent vital signs: Vitals:   07/10/23 2030 07/10/23 2100  BP: (!) 177/98 (!) 108/55  Pulse:  (!) 119  Resp:  (!) 28  Temp:    SpO2:  100%     General: Awake, no distress.  CV:  Good peripheral perfusion.  Resp:  Normal effort.  Diminished breath sounds with no wheezes or rales. Abd:  No distention.  Other:  1+ bilateral lower extremity edema.   ED Results / Procedures / Treatments   Labs (all labs ordered are listed, but only abnormal results are displayed) Labs Reviewed  BASIC METABOLIC PANEL WITH GFR - Abnormal; Notable for the following components:       Result Value   Chloride 94 (*)    Glucose, Bld 189 (*)    BUN 34 (*)    Creatinine, Ser 5.54 (*)    Calcium  8.7 (*)    GFR, Estimated 8 (*)    All other components within normal limits  CBC - Abnormal; Notable for the following components:   RBC 3.20 (*)    Hemoglobin 11.6 (*)    MCV 114.4 (*)    MCH 36.3 (*)    All other components within normal limits  BRAIN NATRIURETIC PEPTIDE - Abnormal; Notable for the following components:   B Natriuretic Peptide 221.3 (*)    All other components within normal limits  RESP PANEL BY RT-PCR (RSV, FLU A&B, COVID)  [id number]     EKG  ED ECG REPORT I, Lind Repine, the attending physician, personally viewed and interpreted this ECG.  Date: 07/10/2023 EKG Time: 2019 Rate: 133 Rhythm: atrial fibrillation QRS Axis: normal Intervals: normal ST/T Wave abnormalities: normal Narrative Interpretation: no evidence of acute ischemia    RADIOLOGY  Chest x-ray: I independently viewed and interpreted the images; there is a left lower lobe opacity and possible effusion   PROCEDURES:  Critical Care performed: No  Procedures   MEDICATIONS ORDERED IN ED: Medications  azithromycin  (ZITHROMAX ) 500 mg in sodium chloride  0.9 % 250 mL IVPB (has no administration in time range)  cefTRIAXone  (ROCEPHIN ) 1 g in sodium chloride  0.9 % 100 mL IVPB (has no administration in time range)     IMPRESSION / MDM / ASSESSMENT AND PLAN / ED COURSE  I reviewed the triage vital signs and the nursing notes.  58 year old female with PMH as noted above presents with worsening shortness of breath and productive cough over the last several days with no significant worsening lower extremity edema.  On exam the patient is in rapid atrial fibrillation and hypertensive.  She was hypoxic to the 80s on room air..  Other vital signs are normal.  Lung sounds are diminished bilaterally.  There is mild bilateral lower extremity edema.  Differential diagnosis includes, but  is not limited to, CHF exacerbation, fluid overload related to ESRD, acute bronchitis, pneumonia, less likely PE.  We will obtain chest x-ray, lab workup, and reassess.  Patient's presentation is most consistent with acute presentation with potential threat to life or bodily function.  The patient is on the cardiac monitor to evaluate for evidence of arrhythmia and/or significant heart rate changes.  ----------------------------------------- 10:20 PM on 07/10/2023 -----------------------------------------  Chest x-ray shows left lower lobe opacity and likely effusion.  Lab workup is overall reassuring.  BNP is only minimally elevated.  CBC shows no leukocytosis.  Respiratory panel is negative.  Overall presentation favors infectious etiology.  The patient has a history of PE but has no chest pain and is on Eliquis  and states she takes it diligently.  I do not suspect acute PE on this presentation.  I have ordered antibiotics for CAP.  Given the hypoxia, the patient will need to be hospitalized.  I consulted Dr. Vallarie Gauze from the hospitalist service; based on our discussion she agrees to evaluate the patient for admission.   FINAL CLINICAL IMPRESSION(S) / ED DIAGNOSES   Final diagnoses:  Community acquired pneumonia of left lower lobe of lung  Pleural effusion     Rx / DC Orders   ED Discharge Orders     None        Note:  This document was prepared using Dragon voice recognition software and may include unintentional dictation errors.    Lind Repine, MD 07/10/23 2221

## 2023-07-10 NOTE — Assessment & Plan Note (Signed)
 Currently on Eliquis  Low suspicion for PE as patient denies chest pain and is compliant with Eliquis 

## 2023-07-10 NOTE — Assessment & Plan Note (Signed)
 Sliding scale insulin coverage

## 2023-07-10 NOTE — H&P (Signed)
 History and Physical    Patient: Nicole Schmidt:[id number] DOB: [birth year] DOA: 07/10/2023 DOS: the patient was seen and examined on 07/10/2023 PCP: Eartha Gold, MD  Patient coming from: Home  Chief Complaint:  Chief Complaint  Patient presents with   Shortness of Breath    HPI: Nicole Schmidt is a 58 y.o. female with medical history significant for History of breast cancer, COPD with chronic respiratory failure previously requiring home trilogy, ESRD on HD( TTS), HFpEF (EF 55-60 05/2021), hypothyroidism, diabetes mellitus, hypertension, permanent A-fib with prior cardioversion, on Eliquis , history of PE, and morbid obesity being admitted with acute on chronic hypoxia secondary to possible pneumonia and possible CHF as well as rapid A-fib.  She presented to the ED by EMS with a several day history of productive cough with shortness of breath beyond her baseline with O2 sats in the 80s on room air at home.  She denied fever or chills.  Denied chest pain, denied lower extremity pain.  Denied lower extremity swelling beyond her baseline.  She was placed on 4 L for transport. Patient was last evaluated by her cardiologist in January 2025 when she was doing well. ED course and data review: On arrival tachycardic to the 120s tachypneic to the upper 20s requiring 4 L to maintain sats in the high 90s.  Initial BP 142/49. Labs notable for BNP 221, troponin pending Basic metabolic panel in keeping with dialysis status and with normal potassium and bicarb.  CBC unremarkable with  anemia of 11.6, baseline 12-13  EKG, personally reviewed and interpreted showing A-fib at 133 with multiple PACs  Chest x-ray showing left lower lung zone airspace opacity. May represent a combination of pulmonary disease and pleural effusion recommending AP and lateral chest  Started on Rocephin  and azithromycin   Hospitalist consulted for admission.     Review of Systems: As mentioned in the history of present  illness. All other systems reviewed and are negative.  Past Medical History:  Diagnosis Date   (HFpEF) heart failure with preserved ejection fraction (HCC)    a. 10/2018 Echo: EF 60-65%, diast dysfxn, RVSP 50.7 mmHg; b. 01/2020 Echo: EF 65-70%, G2DD; c. 01/2021 Echo: EF 60-65%, no rwma, mod LVH. Nl RV fxn, mildly enlarged RV. Mod BAE. Triv MR. d. 05/2021 Echo: EF 55-60; no RWMAs, mild LVH, mild TR.   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 Er/PR pos, her 2 negative.  Margins 1 mm.   COPD (chronic obstructive pulmonary disease) (HCC)    Diabetes mellitus type 2 in obese 02/09/2020   Difficult intubation 09/28/2021   a.) mallampati III-IV; BMI >55 kg/m   Diverticulosis    Dyslipidemia 02/09/2020   ESBL (extended spectrum beta-lactamase) producing bacteria infection 03/20/2021   a. urine culture (+) for ESBL producing E.coli and P. mirabilis   ESRD (end stage renal disease) on dialysis (HCC)    a. T-Th-Sat HD initiated 02/2021   Essential hypertension    Gout    History of kidney stones    Long term current use of anticoagulant    a.) apixaban    Menopause    age 57   Morbid obesity (HCC)    OSA treated with BiPAP    a. on nocturnal PAP therapy;  Trilogy 100 ventilator   Permanent atrial fibrillation (HCC)    a. Dx 10/2018 in setting of PNA. b. CHA2DS2-VASc = 7 (sex, HFpEF, HTN, PE x2, aortic plaque, T2DM); c. s/p successful DCCV 11/2018. d. subsequent recurrent Afib; rate/rhythm maintained on oral metoprolol  succinate; chronically anticoagulated using standard dose apixaban .   Personal history of radiation therapy    Pulmonary embolism (HCC) 10/2014   a. on chronic apixaban    Restrictive lung disease    Shingles 10/24/2021   Sleep difficulties    a.) takes melatonin   Thyroid   goiter    Past Surgical History:  Procedure Laterality Date   A/V FISTULAGRAM Left 09/02/2021   Procedure: A/V Fistulagram;  Surgeon: Jackquelyn Mass, MD;  Location: [facility];  Service: Cardiovascular;  Laterality: Left;   A/V FISTULAGRAM Left 06/27/2022   Procedure: A/V Fistulagram;  Surgeon: Jackquelyn Mass, MD;  Location: [facility];  Service: Cardiovascular;  Laterality: Left;   A/V FISTULAGRAM Left 01/05/2023   Procedure: A/V Fistulagram;  Surgeon: Jackquelyn Mass, MD;  Location: [facility];  Service: Cardiovascular;  Laterality: Left;   A/V FISTULAGRAM Left 07/06/2023   Procedure: A/V Fistulagram;  Surgeon: Jackquelyn Mass, MD;  Location: [facility];  Service: Cardiovascular;  Laterality: Left;   AV FISTULA PLACEMENT Left 07/22/2021   Procedure: ARTERIOVENOUS (AV) FISTULA CREATION (RADIALCEPHALIC);  Surgeon: Jackquelyn Mass, MD;  Location: [facility];  Service: Vascular;  Laterality: Left;   AV FISTULA PLACEMENT Left 12/09/2021   Procedure: ARTERIOVENOUS (AV) FISTULA CREATION (BRACHIAL CEPHALIC);  Surgeon: Jackquelyn Mass, MD;  Location: [facility];  Service: Vascular;  Laterality: Left;   BREAST BIOPSY Right 2014   breast ca   BREAST EXCISIONAL BIOPSY Right 07/19/2012   breast ca   BREAST SURGERY Right 2014   with sentinel node bx subareaolar duct excision   CARDIOVERSION N/A 11/28/2018   Procedure: CARDIOVERSION;  Surgeon: Devorah Fonder, MD;  Location: [facility];  Service: Cardiovascular;  Laterality: N/A;   COLONOSCOPY WITH PROPOFOL  N/A 01/30/2017   Procedure: COLONOSCOPY WITH PROPOFOL ;  Surgeon: Jerlean Mood, MD;  Location: [facility];  Service: Endoscopy;  Laterality: N/A;   CYSTOSCOPY W/ RETROGRADES  06/17/2021   Procedure: CYSTOSCOPY WITH RETROGRADE PYELOGRAM;  Surgeon: Lawerence Pressman, MD;  Location: [facility];  Service: Urology;;   CYSTOSCOPY W/ URETERAL STENT PLACEMENT Left 02/14/2021   Procedure: CYSTOSCOPY  WITH RETROGRADE PYELOGRAM/URETERAL STENT PLACEMENT;  Surgeon: Lawerence Pressman, MD;  Location: [facility];  Service: Urology;  Laterality: Left;   CYSTOSCOPY/URETEROSCOPY/HOLMIUM LASER/STENT PLACEMENT Left 06/17/2021   Procedure: CYSTOSCOPY/URETEROSCOPY/HOLMIUM LASER/STENT EXCHANGE;  Surgeon: Lawerence Pressman, MD;  Location: [facility];  Service: Urology;  Laterality: Left;   DIALYSIS/PERMA CATHETER INSERTION N/A 02/24/2021   Procedure: DIALYSIS/PERMA CATHETER INSERTION;  Surgeon: Celso College, MD;  Location: [facility];  Service: Cardiovascular;  Laterality: N/A;   DIALYSIS/PERMA CATHETER INSERTION N/A 12/16/2021   Procedure: DIALYSIS/PERMA CATHETER INSERTION;  Surgeon: Jackquelyn Mass, MD;  Location: [facility];  Service: Cardiovascular;  Laterality: N/A;   DIALYSIS/PERMA CATHETER REMOVAL N/A 09/04/2022   Procedure: DIALYSIS/PERMA CATHETER REMOVAL;  Surgeon: Celso College, MD;  Location: [facility];  Service: Cardiovascular;  Laterality: N/A;   PERIPHERAL VASCULAR THROMBECTOMY Left 09/28/2021   Procedure: PERIPHERAL VASCULAR THROMBECTOMY;  Surgeon: Jackquelyn Mass, MD;  Location: [facility];  Service: Cardiovascular;  Laterality: Left;   REVISON OF ARTERIOVENOUS FISTULA Left 04/28/2022  Procedure: REVISON OF ARTERIOVENOUS FISTULA (BRACHIAL CEPHALIC);  Surgeon: Jackquelyn Mass, MD;  Location: [facility];  Service: Vascular;  Laterality: Left;   Social History:  reports that she has never smoked. She has never been exposed to tobacco smoke. She has never used smokeless tobacco. She reports that she does not drink alcohol and does not use drugs.  Allergies  Allergen Reactions   Spironolactone      Hyperkalemia  Pt unaware of this allergy    Family History  Problem Relation Age of Onset   Diabetes Father    Hypertension Father    Parkinson's disease Father    Hypertension Mother    Rheum arthritis Mother    Atrial fibrillation Mother    Heart failure  Mother    Heart attack Mother        died in her mid-70's.   Colon cancer Cousin 54   Breast cancer Neg Hx     Prior to Admission medications   Medication Sig Start Date End Date Taking? Authorizing Provider  acetaminophen  (TYLENOL ) 325 MG tablet Take 2 tablets (650 mg total) by mouth every 6 (six) hours as needed for mild pain (or Fever >/= 101). 03/15/21   Angiulli, Everlyn Hockey, PA-C  albuterol  (VENTOLIN  HFA) 108 (90 Base) MCG/ACT inhaler Inhale 2 puffs into the lungs every 6 (six) hours as needed for wheezing or shortness of breath. 05/25/23   Cobb, Mariah Shines, NP  apixaban  (ELIQUIS ) 5 MG TABS tablet Take 1 tablet (5 mg total) by mouth 2 (two) times daily. 04/11/23   Avonne Boettcher, MD  aspirin  EC 81 MG tablet Take 1 tablet (81 mg total) by mouth daily. Swallow whole. 01/05/23 01/05/24  Schnier, Ninette Basque, MD  atorvastatin  (LIPITOR) 40 MG tablet Take 40 mg by mouth daily.    [provider]  calcium  acetate (PHOSLO) 667 MG capsule Take 667 mg by mouth 3 (three) times daily. 11/09/22   [provider]  cinacalcet (SENSIPAR) 30 MG tablet Take 30 mg by mouth daily. 11/26/22   [provider]  cloNIDine  (CATAPRES ) 0.1 MG tablet Take 1 tablet (0.1 mg total) by mouth 2 (two) times daily. 07/26/22   Wenona Hamilton, MD  CRANBERRY PO Take 1 capsule by mouth daily.    [provider]  ezetimibe (ZETIA) 10 MG tablet Take 10 mg by mouth daily with supper.    [provider]  ferric gluconate 250 mg in sodium chloride  0.9 % 250 mL Inject 250 mg into the vein Every Tuesday,Thursday,and Saturday with dialysis. 04/12/21   Angiulli, Everlyn Hockey, PA-C  fluticasone -salmeterol (ADVAIR DISKUS) 100-50 MCG/ACT AEPB Inhale 1 puff into the lungs 2 (two) times daily. 11/24/22   Antonio Baumgarten, NP  gabapentin  (NEURONTIN ) 300 MG capsule Take 1 capsule (300 mg total) by mouth at bedtime. 04/12/21   Angiulli, Everlyn Hockey, PA-C  Lancets [facility] DELICA PLUS [place]) MISC  05/17/21    [provider]  levothyroxine  (SYNTHROID ) 112 MCG tablet Take 1 tablet (112 mcg total) by mouth daily before breakfast. 04/12/21   Angiulli, Everlyn Hockey, PA-C  melatonin 5 MG TABS Take 2 tablets (10 mg total) by mouth at bedtime as needed (sleep). 04/12/21   Angiulli, Everlyn Hockey, PA-C  metoprolol  succinate (TOPROL -XL) 25 MG 24 hr tablet Take 1 tablet (25 mg total) by mouth at bedtime. Take with or immediately following a meal. 07/26/22   Wenona Hamilton, MD  midodrine  (PROAMATINE ) 10 MG tablet Take 1 tablet (10 mg total)  by mouth Every Tuesday,Thursday,and Saturday with dialysis. 04/12/21   Angiulli, Daniel J, PA-C  multivitamin (RENA-VIT) TABS tablet Take 1 tablet by mouth daily. 01/30/23   [provider]  [facility] ULTRA test strip  05/17/21   [provider]  polyethylene glycol powder (GLYCOLAX /MIRALAX ) 17 GM/SCOOP powder Take 1 Container by mouth once. Patient not taking: Reported on 07/02/2023    [provider]  TRULICITY  1.5 MG/0.5ML SOPN Inject 1.5 mg into the skin every Monday. 05/17/21   [provider]  VELPHORO 500 MG chewable tablet Chew 1,000 mg by mouth 3 (three) times daily. 02/07/22   [provider]    Physical Exam: Vitals:   07/10/23 2030 07/10/23 2030 07/10/23 2100 07/10/23 2200  BP: (!) 142/49 (!) 177/98 (!) 108/55 100/64  Pulse: (!) 119  (!) 119 (!) 124  Resp: (!) 26  (!) 28 (!) 21  Temp:      TempSrc:      SpO2: 97%  100% 100%  Weight:      Height:       Physical Exam  Labs on Admission: I have personally reviewed following labs and imaging studies  CBC: Recent Labs  Lab 07/10/23 2025  WBC 10.2  HGB 11.6*  HCT 36.6  MCV 114.4*  PLT 177   Basic Metabolic Panel: Recent Labs  Lab 07/10/23 2025  NA 136  K 4.1  CL 94*  CO2 30  GLUCOSE 189*  BUN 34*  CREATININE 5.54*  CALCIUM  8.7*   GFR: Estimated Creatinine Clearance: 16.2 mL/min (A) (by C-G formula based on SCr of 5.54 mg/dL (H)). Liver Function  Tests: No results for input(s): "AST", "ALT", "ALKPHOS", "BILITOT", "PROT", "ALBUMIN " in the last 168 hours. No results for input(s): "LIPASE", "AMYLASE" in the last 168 hours. No results for input(s): "AMMONIA" in the last 168 hours. Coagulation Profile: No results for input(s): "INR", "PROTIME" in the last 168 hours. Cardiac Enzymes: No results for input(s): "CKTOTAL", "CKMB", "CKMBINDEX", "TROPONINI" in the last 168 hours. BNP (last 3 results) No results for input(s): "PROBNP" in the last 8760 hours. HbA1C: No results for input(s): "HGBA1C" in the last 72 hours. CBG: Recent Labs  Lab 07/06/23 1054 07/06/23 1419 07/06/23 1519  GLUCAP 139* 169* 159*   Lipid Profile: No results for input(s): "CHOL", "HDL", "LDLCALC", "TRIG", "CHOLHDL", "LDLDIRECT" in the last 72 hours. Thyroid  Function Tests: No results for input(s): "TSH", "T4TOTAL", "FREET4", "T3FREE", "THYROIDAB" in the last 72 hours. Anemia Panel: No results for input(s): "VITAMINB12", "FOLATE", "FERRITIN", "TIBC", "IRON ", "RETICCTPCT" in the last 72 hours. Urine analysis:    Component Value Date/Time   COLORURINE YELLOW 04/06/2021 1825   APPEARANCEUR Cloudy (A) 06/08/2021 1401   LABSPEC 1.025 04/06/2021 1825   PHURINE 5.5 04/06/2021 1825   GLUCOSEU Negative 06/08/2021 1401   HGBUR LARGE (A) 04/06/2021 1825   BILIRUBINUR Negative 06/08/2021 1401   KETONESUR NEGATIVE 04/06/2021 1825   PROTEINUR 2+ (A) 06/08/2021 1401   PROTEINUR 100 (A) 04/06/2021 1825   NITRITE Negative 06/08/2021 1401   NITRITE NEGATIVE 04/06/2021 1825   LEUKOCYTESUR 2+ (A) 06/08/2021 1401   LEUKOCYTESUR MODERATE (A) 04/06/2021 1825    Radiological Exams on Admission: DG Chest 1 View Result Date: 07/10/2023 CLINICAL DATA:  10026 Shortness of breath 10026 EXAM: CHEST  1 VIEW COMPARISON:  CT chest 02/09/2020, chest x-ray 01/28/2023 FINDINGS: Cardiomegaly. The heart and mediastinal contours are unchanged given AP portable technique. Atherosclerotic  plaque. No focal consolidation. No pulmonary edema. Left lower lung zone airspace opacity. No  pneumothorax. No acute osseous abnormality. IMPRESSION: Left lower lung zone airspace opacity. Finding may represent combination of pulmonary disease and pleural effusion. Recommend repeat chest x-ray PA and lateral view of the chest for further evaluation. Electronically Signed   By: Morgane  Naveau M.D.   On: 07/10/2023 20:50   Data Reviewed for HPI: Relevant notes from primary care and specialist visits, past discharge summaries as available in EHR, including Care Everywhere. Prior diagnostic testing as pertinent to current admission diagnoses Updated medications and problem lists for reconciliation ED course, including vitals, labs, imaging, treatment and response to treatment Triage notes, nursing and pharmacy notes and ED provider's notes Notable results as noted above in HPI      Assessment and Plan: * CAP (community acquired pneumonia) Acute on chronic respiratory failure with hypoxia, previously on Trelegy COPD and restrictive lung disease Patient with productive cough, normal labs but chest x-ray with left lower lung opacity Continue Rocephin  and azithromycin  started in the emergency room Continue home inhalers DuoNebs as needed Antitussives, incentive spirometer and flutter valve Continue supplemental oxygen  to keep sats over 90  Atrial fibrillation with rapid ventricular response (HCC) Chronic anticoagulation Rate in the 1 teens to 130s Likely driven by coughing and respiratory failure, so likely physiologic response Will give a dose of IV metoprolol  then continue home metoprolol  Continue Eliquis  Continuous cardiac monitoring   Acute on chronic heart failure with preserved ejection fraction (HFpEF) (HCC) Suspecting mild acute exacerbation triggered by pneumonia and rapid A-fib Last EF 55 to 60% in April 2023 Continue metoprolol  IV Lasix  x 1, otherwise should improve with  home hemodialysis Daily weights with intake and output monitoring Nephrology consulted for continuation of dialysis and extra session if needed  History of pulmonary embolus (PE) Currently on Eliquis  Low suspicion for PE as patient denies chest pain and is compliant with Eliquis   ESRD on hemodialysis (HCC) Had full session of dialysis on the day of arrival with no missed or shortened sessions Nephrology consult for continuation of dialysis  Diabetes mellitus, type II (HCC) Sliding scale insulin  coverage  Hypothyroidism Continue levothyroxine  112 mcg  Morbid obesity with BMI of 60.0-69.9, adult (HCC) Complicating factor to overall prognosis and care  Essential hypertension Continue clonidine , metoprolol   History of breast cancer No acute issues suspected    DVT prophylaxis: Eliquis   Consults: none  Advance Care Planning:   Code Status: Prior   Family Communication: none  Disposition Plan: Back to previous home environment  Severity of Illness: The appropriate patient status for this patient is OBSERVATION. Observation status is judged to be reasonable and necessary in order to provide the required intensity of service to ensure the patient's safety. The patient's presenting symptoms, physical exam findings, and initial radiographic and laboratory data in the context of their medical condition is felt to place them at decreased risk for further clinical deterioration. Furthermore, it is anticipated that the patient will be medically stable for discharge from the hospital within 2 midnights of admission.   Author: Lanetta Pion, MD 07/10/2023 10:39 PM  For on call review [email or web address].

## 2023-07-10 NOTE — Assessment & Plan Note (Signed)
 Acute on chronic respiratory failure with hypoxia, previously on Trelegy COPD and restrictive lung disease Patient with productive cough, normal labs but chest x-ray with left lower lung opacity Continue Rocephin  and azithromycin  started in the emergency room Continue home inhalers DuoNebs as needed Antitussives, incentive spirometer and flutter valve Continue supplemental oxygen  to keep sats over 90

## 2023-07-10 NOTE — Assessment & Plan Note (Signed)
 No acute issues suspected

## 2023-07-10 NOTE — Assessment & Plan Note (Signed)
 Continue levothyroxine 112 mcg

## 2023-07-11 ENCOUNTER — Observation Stay
Admit: 2023-07-11 | Discharge: 2023-07-11 | Disposition: A | Discharge status: Home / Self Care | Attending: Internal Medicine

## 2023-07-11 DIAGNOSIS — J9621 Acute and chronic respiratory failure with hypoxia: Secondary | ICD-10-CM | POA: Diagnosis present

## 2023-07-11 DIAGNOSIS — Z1152 Encounter for screening for COVID-19: Secondary | ICD-10-CM | POA: Diagnosis not present

## 2023-07-11 DIAGNOSIS — J690 Pneumonitis due to inhalation of food and vomit: Secondary | ICD-10-CM | POA: Diagnosis present

## 2023-07-11 DIAGNOSIS — Z79890 Hormone replacement therapy (postmenopausal): Secondary | ICD-10-CM | POA: Diagnosis not present

## 2023-07-11 DIAGNOSIS — Z8249 Family history of ischemic heart disease and other diseases of the circulatory system: Secondary | ICD-10-CM | POA: Diagnosis not present

## 2023-07-11 DIAGNOSIS — Z992 Dependence on renal dialysis: Secondary | ICD-10-CM | POA: Diagnosis not present

## 2023-07-11 DIAGNOSIS — I5033 Acute on chronic diastolic (congestive) heart failure: Secondary | ICD-10-CM | POA: Diagnosis present

## 2023-07-11 DIAGNOSIS — I5031 Acute diastolic (congestive) heart failure: Secondary | ICD-10-CM | POA: Diagnosis not present

## 2023-07-11 DIAGNOSIS — E039 Hypothyroidism, unspecified: Secondary | ICD-10-CM | POA: Diagnosis present

## 2023-07-11 DIAGNOSIS — Z6841 Body Mass Index (BMI) 40.0 and over, adult: Secondary | ICD-10-CM | POA: Diagnosis not present

## 2023-07-11 DIAGNOSIS — D631 Anemia in chronic kidney disease: Secondary | ICD-10-CM | POA: Diagnosis present

## 2023-07-11 DIAGNOSIS — N186 End stage renal disease: Secondary | ICD-10-CM | POA: Diagnosis present

## 2023-07-11 DIAGNOSIS — I4821 Permanent atrial fibrillation: Secondary | ICD-10-CM | POA: Diagnosis present

## 2023-07-11 DIAGNOSIS — E1122 Type 2 diabetes mellitus with diabetic chronic kidney disease: Secondary | ICD-10-CM | POA: Diagnosis present

## 2023-07-11 DIAGNOSIS — Z7982 Long term (current) use of aspirin: Secondary | ICD-10-CM | POA: Diagnosis not present

## 2023-07-11 DIAGNOSIS — E1165 Type 2 diabetes mellitus with hyperglycemia: Secondary | ICD-10-CM | POA: Diagnosis present

## 2023-07-11 DIAGNOSIS — Z833 Family history of diabetes mellitus: Secondary | ICD-10-CM | POA: Diagnosis not present

## 2023-07-11 DIAGNOSIS — J9622 Acute and chronic respiratory failure with hypercapnia: Secondary | ICD-10-CM | POA: Diagnosis present

## 2023-07-11 DIAGNOSIS — E785 Hyperlipidemia, unspecified: Secondary | ICD-10-CM | POA: Diagnosis present

## 2023-07-11 DIAGNOSIS — J189 Pneumonia, unspecified organism: Secondary | ICD-10-CM | POA: Diagnosis not present

## 2023-07-11 DIAGNOSIS — I132 Hypertensive heart and chronic kidney disease with heart failure and with stage 5 chronic kidney disease, or end stage renal disease: Secondary | ICD-10-CM | POA: Diagnosis present

## 2023-07-11 DIAGNOSIS — J440 Chronic obstructive pulmonary disease with acute lower respiratory infection: Secondary | ICD-10-CM | POA: Diagnosis present

## 2023-07-11 DIAGNOSIS — Z7951 Long term (current) use of inhaled steroids: Secondary | ICD-10-CM | POA: Diagnosis not present

## 2023-07-11 DIAGNOSIS — Z7901 Long term (current) use of anticoagulants: Secondary | ICD-10-CM | POA: Diagnosis not present

## 2023-07-11 LAB — CBC
HCT: 34.9 % — ABNORMAL LOW (ref 36.0–46.0)
Hemoglobin: 11.2 g/dL — ABNORMAL LOW (ref 12.0–15.0)
MCH: 36.8 pg — ABNORMAL HIGH (ref 26.0–34.0)
MCHC: 32.1 g/dL (ref 30.0–36.0)
MCV: 114.8 fL — ABNORMAL HIGH (ref 80.0–100.0)
Platelets: 166 10*3/uL (ref 150–400)
RBC: 3.04 MIL/uL — ABNORMAL LOW (ref 3.87–5.11)
RDW: 15 % (ref 11.5–15.5)
WBC: 9.4 10*3/uL (ref 4.0–10.5)
nRBC: 0.2 % (ref 0.0–0.2)

## 2023-07-11 LAB — ECHOCARDIOGRAM COMPLETE
AR max vel: 2.87 cm2
AV Area VTI: 3.28 cm2
AV Area mean vel: 2.96 cm2
AV Mean grad: 3 mmHg
AV Peak grad: 7.3 mmHg
Ao pk vel: 1.35 m/s
Area-P 1/2: 5.09 cm2
Height: 5.1 in
MV VTI: 5.12 cm2
S' Lateral: 3.3 cm
Single Plane A4C EF: 78.2 %
Weight: 2476.21 [oz_av]

## 2023-07-11 LAB — GLUCOSE, CAPILLARY
Glucose-Capillary: 141 mg/dL — ABNORMAL HIGH (ref 70–99)
Glucose-Capillary: 143 mg/dL — ABNORMAL HIGH (ref 70–99)
Glucose-Capillary: 164 mg/dL — ABNORMAL HIGH (ref 70–99)
Glucose-Capillary: 202 mg/dL — ABNORMAL HIGH (ref 70–99)
Glucose-Capillary: 258 mg/dL — ABNORMAL HIGH (ref 70–99)

## 2023-07-11 LAB — BASIC METABOLIC PANEL WITH GFR
Anion gap: 14 (ref 5–15)
BUN: 39 mg/dL — ABNORMAL HIGH (ref 6–20)
CO2: 28 mmol/L (ref 22–32)
Calcium: 8.1 mg/dL — ABNORMAL LOW (ref 8.9–10.3)
Chloride: 95 mmol/L — ABNORMAL LOW (ref 98–111)
Creatinine, Ser: 6.18 mg/dL — ABNORMAL HIGH (ref 0.44–1.00)
GFR, Estimated: 7 mL/min — ABNORMAL LOW (ref >60)
Glucose, Bld: 192 mg/dL — ABNORMAL HIGH (ref 70–99)
Potassium: 4.1 mmol/L (ref 3.5–5.1)
Sodium: 137 mmol/L (ref 135–145)

## 2023-07-11 LAB — HIV ANTIBODY (ROUTINE TESTING W REFLEX): HIV Screen 4th Generation wRfx: NONREACTIVE

## 2023-07-11 LAB — HEMOGLOBIN A1C
Hgb A1c MFr Bld: 8.3 % — ABNORMAL HIGH (ref 4.8–5.6)
Mean Plasma Glucose: 191.51 mg/dL

## 2023-07-11 MED ORDER — PERFLUTREN LIPID MICROSPHERE
1.0000 mL | INTRAVENOUS | Status: AC | PRN
  Administered 2023-07-11: 2 mL via INTRAVENOUS
  Filled 2023-07-11: qty 10

## 2023-07-11 MED ORDER — ORAL CARE MOUTH RINSE: 15.0000 mL | OROMUCOSAL | Status: DC | PRN

## 2023-07-11 MED ORDER — SODIUM CHLORIDE 0.9 % IV SOLN
500.0000 mg | INTRAVENOUS | Status: DC
  Administered 2023-07-12: 500 mg via INTRAVENOUS
  Filled 2023-07-11: qty 5

## 2023-07-11 MED ORDER — GUAIFENESIN-DM 100-10 MG/5ML PO SYRP
15.0000 mL | ORAL_SOLUTION | ORAL | Status: DC | PRN
  Administered 2023-07-11 – 2023-07-13 (×8): 15 mL via ORAL
  Filled 2023-07-11 (×8): qty 20

## 2023-07-11 MED ORDER — SODIUM CHLORIDE 0.9 % IV SOLN
1.0000 g | INTRAVENOUS | Status: DC
  Administered 2023-07-11 – 2023-07-13 (×3): 1 g via INTRAVENOUS
  Filled 2023-07-11 (×5): qty 10

## 2023-07-11 NOTE — Plan of Care (Signed)
  Problem: Education: Goal: Knowledge of General Education information will improve Description: Including pain rating scale, medication(s)/side effects and non-pharmacologic comfort measures Outcome: Progressing   Problem: Clinical Measurements: Goal: Respiratory complications will improve Outcome: Progressing   Problem: Clinical Measurements: Goal: Cardiovascular complication will be avoided Outcome: Progressing   Problem: Elimination: Goal: Will not experience complications related to bowel motility Outcome: Progressing   Problem: Pain Managment: Goal: General experience of comfort will improve and/or be controlled Outcome: Progressing   Problem: Safety: Goal: Ability to remain free from injury will improve Outcome: Progressing

## 2023-07-11 NOTE — Progress Notes (Signed)
 Heart Failure Navigator Progress Note  Assessed for Heart & Vascular TOC clinic readiness.  Unfortunately this patient does not meet criteria due to ESRD on hemodialysis.   Navigator will sign off at this time.  Celedonio Coil, RN, BSN [facility] Heart Failure Navigator Secure Chat Only

## 2023-07-11 NOTE — Progress Notes (Signed)
 PROGRESS NOTE    Nicole Schmidt  WUJ:[id number] DOB: [birth year] DOA: 07/10/2023 PCP: Eartha Gold, MD   Assessment & Plan:   Principal Problem:   CAP/Possible aspiration (community acquired pneumonia) Active Problems:   Acute on chronic heart failure with preserved ejection fraction (HFpEF) (HCC)   Acute respiratory failure with hypoxia and hypercapnia (HCC)   Restrictive lung disease   COPD (chronic obstructive pulmonary disease) (HCC)   Atrial fibrillation with rapid ventricular response (HCC)   Chronic anticoagulation   History of pulmonary embolus (PE)   ESRD on hemodialysis (HCC)   History of breast cancer   Essential hypertension   Morbid obesity with BMI of 60.0-69.9, adult (HCC)   Hypothyroidism   Diabetes mellitus, type II (HCC)   (HFpEF) heart failure with preserved ejection fraction (HCC)  Assessment and Plan:  CAP: continue on IV rocephin , azithromycin , bronchodilators & encourage incentive spirometry. Tussionex prn   Acute on chronic hypoxic respiratory failure: only uses oxygen  at night as per pt. Continue on supplemental oxygen  and wean back to baseline as tolerated   Acute on chronic diastolic CHF: continue on metoprolol . S/p lasix  x1. Monitor I/Os. Fluid/volume management w/ HD.    Likely PAF: w/ RVR. Continue on eliquis , metoprolol    Hx of pulmonary embolus: continue on eliquis     ESRD: on HD. Nephro following and recs apprec    DM2: poorly controlled, HbA1c 8.3. Continue on SSI w/ accuchecks   Macrocytic anemia: will check B12, folate    Hypothyroidism: continue on home dose of levothyroxine     Morbid obesity: BMI 41.8. Complicates overall care & prognosis   HTN: continue on home dose of metoprolol , clonidine   Continue clonidine , metoprolol    Hx of breast cancer: management per onco outpatient        DVT prophylaxis: eliquis   Code Status: full  Family Communication:  Disposition Plan: likely d/c back home  Status is:  Inpatient Remains inpatient appropriate because: requiring IV abxs    Level of care: Progressive Consultants:    Procedures:   Antimicrobials: rocephin , azithromycin   Subjective: Pt c/o shortness of breath   Objective: Vitals:   07/11/23 0155 07/11/23 0500 07/11/23 0529 07/11/23 0752  BP: 117/67  110/67 137/83  Pulse: 100   92  Resp: (!) 25  20   Temp: 98.2 F (36.8 C)  98.2 F (36.8 C) (!) 97.5 F (36.4 C)  TempSrc: Oral  Oral   SpO2: 97%  98% 100%  Weight:  70.2 kg    Height:        Intake/Output Summary (Last 24 hours) at 07/11/2023 0828 Last data filed at 07/11/2023 0125 Gross per 24 hour  Intake 582.62 ml  Output --  Net 582.62 ml   Filed Weights   07/10/23 2016 07/10/23 2352 07/11/23 0500  Weight: (!) 162.4 kg 70.4 kg 70.2 kg    Examination:  General exam: Appears calm and comfortable. Morbid obesity   Respiratory system: decreased breath sounds b/l  Cardiovascular system: S1 & S2+. No rubs, gallops or clicks.  Gastrointestinal system: Abdomen is obese, soft and nontender. Hypoactive bowel sounds heard. Central nervous system: Alert and oriented. Moves all extremities Psychiatry: Judgement and insight appear normal. Mood & affect appropriate.     Data Reviewed: I have personally reviewed following labs and imaging studies  CBC: Recent Labs  Lab 07/10/23 2025 07/11/23 0258  WBC 10.2 9.4  HGB 11.6* 11.2*  HCT 36.6 34.9*  MCV 114.4* 114.8*  PLT 177 166   Basic  Metabolic Panel: Recent Labs  Lab 07/10/23 2025 07/11/23 0258  NA 136 137  K 4.1 4.1  CL 94* 95*  CO2 30 28  GLUCOSE 189* 192*  BUN 34* 39*  CREATININE 5.54* 6.18*  CALCIUM  8.7* 8.1*   GFR: CrCl cannot be calculated (Unknown ideal weight.). Liver Function Tests: No results for input(s): "AST", "ALT", "ALKPHOS", "BILITOT", "PROT", "ALBUMIN " in the last 168 hours. No results for input(s): "LIPASE", "AMYLASE" in the last 168 hours. No results for input(s): "AMMONIA" in the  last 168 hours. Coagulation Profile: No results for input(s): "INR", "PROTIME" in the last 168 hours. Cardiac Enzymes: No results for input(s): "CKTOTAL", "CKMB", "CKMBINDEX", "TROPONINI" in the last 168 hours. BNP (last 3 results) No results for input(s): "PROBNP" in the last 8760 hours. HbA1C: No results for input(s): "HGBA1C" in the last 72 hours. CBG: Recent Labs  Lab 07/06/23 1054 07/06/23 1419 07/06/23 1519 07/11/23 0036 07/11/23 0752  GLUCAP 139* 169* 159* 258* 143*   Lipid Profile: No results for input(s): "CHOL", "HDL", "LDLCALC", "TRIG", "CHOLHDL", "LDLDIRECT" in the last 72 hours. Thyroid  Function Tests: No results for input(s): "TSH", "T4TOTAL", "FREET4", "T3FREE", "THYROIDAB" in the last 72 hours. Anemia Panel: No results for input(s): "VITAMINB12", "FOLATE", "FERRITIN", "TIBC", "IRON ", "RETICCTPCT" in the last 72 hours. Sepsis Labs: No results for input(s): "PROCALCITON", "LATICACIDVEN" in the last 168 hours.  Recent Results (from the past 240 hours)  Resp panel by RT-PCR (RSV, Flu A&B, Covid) Anterior Nasal Swab     Status: None   Collection Time: 07/10/23  8:33 PM   Specimen: Anterior Nasal Swab  Result Value Ref Range Status   SARS Coronavirus 2 by RT PCR NEGATIVE NEGATIVE Final    Comment: (NOTE) SARS-CoV-2 target nucleic acids are NOT DETECTED.  The SARS-CoV-2 RNA is generally detectable in upper respiratory specimens during the acute phase of infection. The lowest concentration of SARS-CoV-2 viral copies this assay can detect is 138 copies/mL. A negative result does not preclude SARS-Cov-2 infection and should not be used as the sole basis for treatment or other patient management decisions. A negative result may occur with  improper specimen collection/handling, submission of specimen other than nasopharyngeal swab, presence of viral mutation(s) within the areas targeted by this assay, and inadequate number of viral copies(<138 copies/mL). A negative  result must be combined with clinical observations, patient history, and epidemiological information. The expected result is Negative.  Fact Sheet for Patients:  BloggerCourse.com  Fact Sheet for Healthcare Providers:  SeriousBroker.it  This test is no t yet approved or cleared by the [place]  FDA and  has been authorized for detection and/or diagnosis of SARS-CoV-2 by FDA under an Emergency Use Authorization (EUA). This EUA will remain  in effect (meaning this test can be used) for the duration of the COVID-19 declaration under Section 564(b)(1) of the Act, 21 U.S.C.section 360bbb-3(b)(1), unless the authorization is terminated  or revoked sooner.       Influenza A by PCR NEGATIVE NEGATIVE Final   Influenza B by PCR NEGATIVE NEGATIVE Final    Comment: (NOTE) The Xpert Xpress SARS-CoV-2/FLU/RSV plus assay is intended as an aid in the diagnosis of influenza from Nasopharyngeal swab specimens and should not be used as a sole basis for treatment. Nasal washings and aspirates are unacceptable for Xpert Xpress SARS-CoV-2/FLU/RSV testing.  Fact Sheet for Patients: BloggerCourse.com  Fact Sheet for Healthcare Providers: SeriousBroker.it  This test is not yet approved or cleared by the [place]  FDA and has been authorized for  detection and/or diagnosis of SARS-CoV-2 by FDA under an Emergency Use Authorization (EUA). This EUA will remain in effect (meaning this test can be used) for the duration of the COVID-19 declaration under Section 564(b)(1) of the Act, 21 U.S.C. section 360bbb-3(b)(1), unless the authorization is terminated or revoked.     Resp Syncytial Virus by PCR NEGATIVE NEGATIVE Final    Comment: (NOTE) Fact Sheet for Patients: BloggerCourse.com  Fact Sheet for Healthcare Providers: SeriousBroker.it  This  test is not yet approved or cleared by the [place]  FDA and has been authorized for detection and/or diagnosis of SARS-CoV-2 by FDA under an Emergency Use Authorization (EUA). This EUA will remain in effect (meaning this test can be used) for the duration of the COVID-19 declaration under Section 564(b)(1) of the Act, 21 U.S.C. section 360bbb-3(b)(1), unless the authorization is terminated or revoked.  Performed at [facility], [address], [place], [place] [address]          Radiology Studies: DG Chest 1 View Result Date: 07/10/2023 CLINICAL DATA:  10026 Shortness of breath 10026 EXAM: CHEST  1 VIEW COMPARISON:  CT chest 02/09/2020, chest x-ray 01/28/2023 FINDINGS: Cardiomegaly. The heart and mediastinal contours are unchanged given AP portable technique. Atherosclerotic plaque. No focal consolidation. No pulmonary edema. Left lower lung zone airspace opacity. No pneumothorax. No acute osseous abnormality. IMPRESSION: Left lower lung zone airspace opacity. Finding may represent combination of pulmonary disease and pleural effusion. Recommend repeat chest x-ray PA and lateral view of the chest for further evaluation. Electronically Signed   By: Morgane  Naveau M.D.   On: 07/10/2023 20:50        Scheduled Meds:  apixaban   5 mg Oral BID   aspirin  EC  81 mg Oral Daily   atorvastatin   40 mg Oral Daily   cinacalcet  30 mg Oral Q breakfast   fluticasone  furoate-vilanterol  1 puff Inhalation Daily   furosemide   40 mg Intravenous Once   gabapentin   300 mg Oral QHS   insulin  aspart  0-20 Units Subcutaneous TID WC   insulin  aspart  0-5 Units Subcutaneous QHS   levothyroxine   112 mcg Oral Q0600   metoprolol  succinate  25 mg Oral QHS   sodium chloride  flush  3 mL Intravenous Q12H   Continuous Infusions:  sodium chloride        LOS: 0 days      Alphonsus Jeans, MD [facility] Pager [phone]  If 7PM-7AM, please contact  night-coverage [email or web address] Password [facility] 07/11/2023, 8:28 AM

## 2023-07-11 NOTE — Discharge Instructions (Signed)
 Agency Name: [facility] Address: [address] [place], [place] [address] Phone: [phone] Website: [email or web address] Service(s) Offered: Housing services, self-sufficiency, congregate meal program, and individual development account program.  Agency Name: [employer] Address: [address] [place], [place] [address] Phone: [phone] Email: [email or web address] Website: [email or web address] Service(s) Offered: Housing the homeless, feeding the hungry, Company secretary, job and education related services.  Agency Name: [facility] Address: [address], [place], [place] [address] Phone: [phone] Email: [email or web address] Service(s) Offered: Counseling, problem pregnancy, advocacy for Hispanics, limited emergency financial assistance.  Agency Name: [employer] Address: [address] [place], [place] [address] Phone: [phone] Website: [email or web address] Service(s) Offered: Child support services; child welfare services; SNAP; Medicaid; work first family assistance; and aid with fuel,  rent, food and medicine.  Agency Name: [employer] Address: [address], [place], [place] [address] Phone: [phone] or [phone] Email: [email or web address] Service(s) Offered: Family services and transient assistance; emergency food, fuel, clothing, limited furniture, utilities; budget counseling, general counseling; give a kid a coat; thrift store; Christmas food and toys. Utility assistance, food pantry, rental  assistance, life sustaining medicine

## 2023-07-11 NOTE — Plan of Care (Signed)
  Problem: Fluid Volume: Goal: Ability to maintain a balanced intake and output will improve Outcome: Progressing   Problem: Health Behavior/Discharge Planning: Goal: Ability to identify and utilize available resources and services will improve Outcome: Progressing Goal: Ability to manage health-related needs will improve Outcome: Progressing   Problem: Metabolic: Goal: Ability to maintain appropriate glucose levels will improve Outcome: Progressing

## 2023-07-11 NOTE — Progress Notes (Signed)
  Echocardiogram 2D Echocardiogram has been performed.  [name] L Remmie Bembenek RDCS 07/11/2023, 2:37 PM

## 2023-07-11 NOTE — Progress Notes (Signed)
 Pt refused lasix  IV, added " I no longer produce urine". MD Vallarie Gauze made aware. No new order place.

## 2023-07-12 DIAGNOSIS — J189 Pneumonia, unspecified organism: Secondary | ICD-10-CM | POA: Diagnosis not present

## 2023-07-12 LAB — CBC
HCT: 34.8 % — ABNORMAL LOW (ref 36.0–46.0)
Hemoglobin: 11.2 g/dL — ABNORMAL LOW (ref 12.0–15.0)
MCH: 36.2 pg — ABNORMAL HIGH (ref 26.0–34.0)
MCHC: 32.2 g/dL (ref 30.0–36.0)
MCV: 112.6 fL — ABNORMAL HIGH (ref 80.0–100.0)
Platelets: 171 10*3/uL (ref 150–400)
RBC: 3.09 MIL/uL — ABNORMAL LOW (ref 3.87–5.11)
RDW: 15.1 % (ref 11.5–15.5)
WBC: 12.1 10*3/uL — ABNORMAL HIGH (ref 4.0–10.5)
nRBC: 0 % (ref 0.0–0.2)

## 2023-07-12 LAB — BASIC METABOLIC PANEL WITH GFR
Anion gap: 15 (ref 5–15)
BUN: 62 mg/dL — ABNORMAL HIGH (ref 6–20)
CO2: 27 mmol/L (ref 22–32)
Calcium: 8.5 mg/dL — ABNORMAL LOW (ref 8.9–10.3)
Chloride: 93 mmol/L — ABNORMAL LOW (ref 98–111)
Creatinine, Ser: 7.94 mg/dL — ABNORMAL HIGH (ref 0.44–1.00)
GFR, Estimated: 5 mL/min — ABNORMAL LOW (ref >60)
Glucose, Bld: 194 mg/dL — ABNORMAL HIGH (ref 70–99)
Potassium: 4.4 mmol/L (ref 3.5–5.1)
Sodium: 135 mmol/L (ref 135–145)

## 2023-07-12 LAB — VITAMIN B12: Vitamin B-12: 443 pg/mL (ref 180–914)

## 2023-07-12 LAB — FOLATE: Folate: 35 ng/mL (ref >5.9)

## 2023-07-12 LAB — GLUCOSE, CAPILLARY
Glucose-Capillary: 143 mg/dL — ABNORMAL HIGH (ref 70–99)
Glucose-Capillary: 119 mg/dL — ABNORMAL HIGH (ref 70–99)
Glucose-Capillary: 204 mg/dL — ABNORMAL HIGH (ref 70–99)

## 2023-07-12 LAB — HEPATITIS B SURFACE ANTIGEN: Hepatitis B Surface Ag: NONREACTIVE

## 2023-07-12 MED ORDER — ACETAMINOPHEN 325 MG PO TABS
ORAL_TABLET | ORAL | Status: AC
Start: 2023-07-12 — End: ?
  Filled 2023-07-12: qty 2

## 2023-07-12 MED ORDER — CHLORHEXIDINE GLUCONATE CLOTH 2 % EX PADS
6.0000 | MEDICATED_PAD | Freq: Every day | CUTANEOUS | Status: DC
  Administered 2023-07-12 – 2023-07-13 (×2): 6 via TOPICAL

## 2023-07-12 MED ORDER — AZITHROMYCIN 250 MG PO TABS
500.0000 mg | ORAL_TABLET | Freq: Every day | ORAL | Status: DC
  Administered 2023-07-13 – 2023-07-14 (×2): 500 mg via ORAL
  Filled 2023-07-12 (×2): qty 2

## 2023-07-12 MED ORDER — VITAMIN B-12 1000 MCG PO TABS
1000.0000 ug | ORAL_TABLET | Freq: Every day | ORAL | Status: DC
  Administered 2023-07-12 – 2023-07-14 (×3): 1000 ug via ORAL
  Filled 2023-07-12 (×4): qty 1

## 2023-07-12 NOTE — Progress Notes (Signed)
   07/12/23 1600  Post Treatment  Dialyzer Clearance Lightly streaked  Hemodialysis Intake (mL) 0 mL  Liters Processed 73.5  Fluid Removed (mL) 2500 mL  Tolerated HD Treatment Yes  Post-Hemodialysis Comments Tx Complete, Tolerated well  AVG/AVF Arterial Site Held (minutes) 10 minutes  AVG/AVF Venous Site Held (minutes) 10 minutes

## 2023-07-12 NOTE — Plan of Care (Signed)
  Problem: Education: Goal: Knowledge of General Education information will improve Description: Including pain rating scale, medication(s)/side effects and non-pharmacologic comfort measures Outcome: Progressing   Problem: Health Behavior/Discharge Planning: Goal: Ability to manage health-related needs will improve Outcome: Progressing   Problem: Clinical Measurements: Goal: Respiratory complications will improve Outcome: Progressing   Problem: Clinical Measurements: Goal: Cardiovascular complication will be avoided Outcome: Progressing   Problem: Activity: Goal: Risk for activity intolerance will decrease Outcome: Progressing   Problem: Pain Managment: Goal: General experience of comfort will improve and/or be controlled Outcome: Progressing

## 2023-07-12 NOTE — Progress Notes (Signed)
 [facility]  ROUNDING NOTE   Subjective:   Nicole Schmidt  is a 58 year old female with past medical histories including hypertension, diabetes, hypothyroidism, atrial fibrillation, morbid obesity, and end-stage renal disease on hemodialysis. She presents to Ed with cough and weakness. She has been admitted for Pleural effusion [J90] Atrial fibrillation with rapid ventricular response (HCC) [I48.91] Community acquired pneumonia of left lower lobe of lung [J18.9] CAP (community acquired pneumonia) [J18.9]   Patient is known to our clinic and receives outpatient dialysis treatments at [facility] on a TTS schedule, overseen by Dr. Rhesa Celeste. Last treatment received on Tuesday. She states she bagan having a cough last week that has progressively worsened. She had plans on Saturday but had to cancel last minute do to feeling unwell. She reports she slept all day on Monday, which is not like her. She felt fine on Tuesday and went to treatment. But after treatment, her cough worsened.   Labs on ED arrival unremarkable for renal patient. Chest xray shows left lower lung opacity.   We have been consulted to manage dialysis needs.   Objective:  Vital signs in last 24 hours:  Temp:  [97.8 F (36.6 C)-98.4 F (36.9 C)] 98.4 F (36.9 C) (05/29 1218) Pulse Rate:  [94-106] 101 (05/29 1330) Resp:  [17-31] 17 (05/29 1330) BP: (95-151)/(49-84) 149/76 (05/29 1330) SpO2:  [89 %-100 %] 93 % (05/29 1330) Weight:  [163.7 kg] 163.7 kg (05/29 0500)  Weight change: 1.312 kg Filed Weights   07/10/23 2352 07/11/23 0500 07/12/23 0500  Weight: 70.4 kg 70.2 kg (!) 163.7 kg    Intake/Output: I/O last 3 completed shifts: In: 1062.6 [P.O.:720; IV Piggyback:342.6] Out: -    Intake/Output this shift:  Total I/O In: 240 [P.O.:240] Out: -   Physical Exam: General: NAD, sitting in chair  Head: Normocephalic, atraumatic. Moist oral mucosal membranes  Eyes: Anicteric  Lungs:  Rhonchi, Purdy O2   Heart: Regular rate and rhythm  Abdomen:  Soft, nontender, obese  Extremities:  2+ peripheral edema.  Neurologic: Nonfocal, moving all four extremities  Skin: No lesions  Access: Lt AVF    Basic Metabolic Panel: Recent Labs  Lab 07/10/23 2025 07/11/23 0258 07/12/23 0458  NA 136 137 135  K 4.1 4.1 4.4  CL 94* 95* 93*  CO2 30 28 27   GLUCOSE 189* 192* 194*  BUN 34* 39* 62*  CREATININE 5.54* 6.18* 7.94*  CALCIUM  8.7* 8.1* 8.5*    Liver Function Tests: No results for input(s): "AST", "ALT", "ALKPHOS", "BILITOT", "PROT", "ALBUMIN " in the last 168 hours. No results for input(s): "LIPASE", "AMYLASE" in the last 168 hours. No results for input(s): "AMMONIA" in the last 168 hours.  CBC: Recent Labs  Lab 07/10/23 2025 07/11/23 0258 07/12/23 0458  WBC 10.2 9.4 12.1*  HGB 11.6* 11.2* 11.2*  HCT 36.6 34.9* 34.8*  MCV 114.4* 114.8* 112.6*  PLT 177 166 171    Cardiac Enzymes: No results for input(s): "CKTOTAL", "CKMB", "CKMBINDEX", "TROPONINI" in the last 168 hours.  BNP: Invalid input(s): "POCBNP"  CBG: Recent Labs  Lab 07/11/23 0752 07/11/23 1200 07/11/23 1637 07/11/23 2029 07/12/23 0806  GLUCAP 143* 202* 141* 164* 143*    Microbiology: Results for orders placed or performed during the hospital encounter of 07/10/23  Resp panel by RT-PCR (RSV, Flu A&B, Covid) Anterior Nasal Swab     Status: None   Collection Time: 07/10/23  8:33 PM   Specimen: Anterior Nasal Swab  Result Value Ref Range Status  SARS Coronavirus 2 by RT PCR NEGATIVE NEGATIVE Final    Comment: (NOTE) SARS-CoV-2 target nucleic acids are NOT DETECTED.  The SARS-CoV-2 RNA is generally detectable in upper respiratory specimens during the acute phase of infection. The lowest concentration of SARS-CoV-2 viral copies this assay can detect is 138 copies/mL. A negative result does not preclude SARS-Cov-2 infection and should not be used as the sole basis for treatment or other patient management  decisions. A negative result may occur with  improper specimen collection/handling, submission of specimen other than nasopharyngeal swab, presence of viral mutation(s) within the areas targeted by this assay, and inadequate number of viral copies(<138 copies/mL). A negative result must be combined with clinical observations, patient history, and epidemiological information. The expected result is Negative.  Fact Sheet for Patients:  BloggerCourse.com  Fact Sheet for Healthcare Providers:  SeriousBroker.it  This test is no t yet approved or cleared by the [place]  FDA and  has been authorized for detection and/or diagnosis of SARS-CoV-2 by FDA under an Emergency Use Authorization (EUA). This EUA will remain  in effect (meaning this test can be used) for the duration of the COVID-19 declaration under Section 564(b)(1) of the Act, 21 U.S.C.section 360bbb-3(b)(1), unless the authorization is terminated  or revoked sooner.       Influenza A by PCR NEGATIVE NEGATIVE Final   Influenza B by PCR NEGATIVE NEGATIVE Final    Comment: (NOTE) The Xpert Xpress SARS-CoV-2/FLU/RSV plus assay is intended as an aid in the diagnosis of influenza from Nasopharyngeal swab specimens and should not be used as a sole basis for treatment. Nasal washings and aspirates are unacceptable for Xpert Xpress SARS-CoV-2/FLU/RSV testing.  Fact Sheet for Patients: BloggerCourse.com  Fact Sheet for Healthcare Providers: SeriousBroker.it  This test is not yet approved or cleared by the [place]  FDA and has been authorized for detection and/or diagnosis of SARS-CoV-2 by FDA under an Emergency Use Authorization (EUA). This EUA will remain in effect (meaning this test can be used) for the duration of the COVID-19 declaration under Section 564(b)(1) of the Act, 21 U.S.C. section 360bbb-3(b)(1), unless the  authorization is terminated or revoked.     Resp Syncytial Virus by PCR NEGATIVE NEGATIVE Final    Comment: (NOTE) Fact Sheet for Patients: BloggerCourse.com  Fact Sheet for Healthcare Providers: SeriousBroker.it  This test is not yet approved or cleared by the [place]  FDA and has been authorized for detection and/or diagnosis of SARS-CoV-2 by FDA under an Emergency Use Authorization (EUA). This EUA will remain in effect (meaning this test can be used) for the duration of the COVID-19 declaration under Section 564(b)(1) of the Act, 21 U.S.C. section 360bbb-3(b)(1), unless the authorization is terminated or revoked.  Performed at [facility], [address] [place], [place] [address]     Coagulation Studies: No results for input(s): "LABPROT", "INR" in the last 72 hours.  Urinalysis: No results for input(s): "COLORURINE", "LABSPEC", "PHURINE", "GLUCOSEU", "HGBUR", "BILIRUBINUR", "KETONESUR", "PROTEINUR", "UROBILINOGEN", "NITRITE", "LEUKOCYTESUR" in the last 72 hours.  Invalid input(s): "APPERANCEUR"    Imaging: ECHOCARDIOGRAM COMPLETE Result Date: 07/11/2023    ECHOCARDIOGRAM REPORT   Patient Name:   Nicole Schmidt Date of Exam: 07/11/2023 Medical Rec #:  [id number]      Height:       5.1 in Accession #:    [id number]     Weight:       355.8 lb Date of Birth:  [birth year]       BSA:  0.400 m Patient Age:    57 years       BP:           115/60 mmHg Patient Gender: F              HR:           87 bpm. Exam Location:  [facility] Procedure: 2D Echo, Cardiac Doppler, Color Doppler and Intracardiac            Opacification Agent (Both Spectral and Color Flow Doppler were            utilized during procedure). Indications:     CHF-acute diastolic  History:         Patient has prior history of Echocardiogram examinations, most                  recent 05/18/2021. CHF, COPD, Arrythmias:Atrial Fibrillation;                  Risk  Factors:Hypertension, Diabetes, Dyslipidemia and Sleep                  Apnea. CKD stage 3.  Sonographer:     Aldon Ambrosia Referring Phys:  [id number] Lanetta Pion Diagnosing Phys: Antionette Kirks MD  Sonographer Comments: Image acquisition challenging due to patient body habitus. IMPRESSIONS  1. Left ventricular ejection fraction, by estimation, is 55 to 60%. The left ventricle has normal function. Left ventricular endocardial border not optimally defined to evaluate regional wall motion. There is mild left ventricular hypertrophy. Left ventricular diastolic parameters are indeterminate.  2. Right ventricular systolic function is normal. The right ventricular size is normal. There is moderately elevated pulmonary artery systolic pressure.  3. Left atrial size was mildly dilated.  4. The mitral valve is normal in structure. No evidence of mitral valve regurgitation. No evidence of mitral stenosis.  5. The aortic valve is normal in structure. Aortic valve regurgitation is not visualized. No aortic stenosis is present. FINDINGS  Left Ventricle: Left ventricular ejection fraction, by estimation, is 55 to 60%. The left ventricle has normal function. Left ventricular endocardial border not optimally defined to evaluate regional wall motion. The left ventricular internal cavity size was normal in size. There is mild left ventricular hypertrophy. Left ventricular diastolic parameters are indeterminate. Right Ventricle: The right ventricular size is normal. No increase in right ventricular wall thickness. Right ventricular systolic function is normal. There is moderately elevated pulmonary artery systolic pressure. The tricuspid regurgitant velocity is 3.36 m/s, and with an assumed right atrial pressure of 5 mmHg, the estimated right ventricular systolic pressure is 50.2 mmHg. Left Atrium: Left atrial size was mildly dilated. Right Atrium: Right atrial size was normal in size. Pericardium: There is no evidence of pericardial  effusion. Mitral Valve: The mitral valve is normal in structure. No evidence of mitral valve regurgitation. No evidence of mitral valve stenosis. MV peak gradient, 5.3 mmHg. The mean mitral valve gradient is 3.0 mmHg. Tricuspid Valve: The tricuspid valve is normal in structure. Tricuspid valve regurgitation is mild . No evidence of tricuspid stenosis. Aortic Valve: The aortic valve is normal in structure. Aortic valve regurgitation is not visualized. No aortic stenosis is present. Aortic valve mean gradient measures 3.0 mmHg. Aortic valve peak gradient measures 7.3 mmHg. Aortic valve area, by VTI measures 3.28 cm. Pulmonic Valve: The pulmonic valve was normal in structure. Pulmonic valve regurgitation is not visualized. No evidence of pulmonic stenosis. Aorta: The aortic root is normal in size  and structure. Venous: The inferior vena cava was not well visualized. IAS/Shunts: No atrial level shunt detected by color flow Doppler.  LEFT VENTRICLE PLAX 2D LVIDd:         4.90 cm      Diastology LVIDs:         3.30 cm      LV e' medial:    8.81 cm/s LV PW:         1.30 cm      LV E/e' medial:  13.1 LV IVS:        0.90 cm      LV e' lateral:   12.40 cm/s LVOT diam:     2.20 cm      LV E/e' lateral: 9.3 LV SV:         65 LV SV Index:   162 LVOT Area:     3.80 cm  LV Volumes (MOD) LV vol d, MOD A4C: 403.0 ml LV vol s, MOD A4C: 87.8 ml LV SV MOD A4C:     403.0 ml RIGHT VENTRICLE RV Basal diam:  4.70 cm RV Mid diam:    3.20 cm RV S prime:     10.30 cm/s TAPSE (M-mode): 2.0 cm LEFT ATRIUM             Index         RIGHT ATRIUM           Index LA diam:        4.20 cm 10.49 cm/m   RA Area:     16.50 cm LA Vol (A2C):   59.0 ml 147.42 ml/m  RA Volume:   37.60 ml  93.95 ml/m LA Vol (A4C):   86.3 ml 215.63 ml/m LA Biplane Vol: 78.1 ml 195.14 ml/m  AORTIC VALVE                    PULMONIC VALVE AV Area (Vmax):    2.87 cm     PV Vmax:       0.95 m/s AV Area (Vmean):   2.96 cm     PV Peak grad:  3.6 mmHg AV Area (VTI):      3.28 cm AV Vmax:           135.00 cm/s AV Vmean:          84.000 cm/s AV VTI:            0.198 m AV Peak Grad:      7.3 mmHg AV Mean Grad:      3.0 mmHg LVOT Vmax:         102.00 cm/s LVOT Vmean:        65.400 cm/s LVOT VTI:          0.171 m LVOT/AV VTI ratio: 0.86  AORTA Ao Root diam: 2.80 cm Ao Asc diam:  3.00 cm MITRAL VALVE                TRICUSPID VALVE MV Area (PHT): 5.09 cm     TR Peak grad:   45.2 mmHg MV Area VTI:   5.12 cm     TR Vmax:        336.00 cm/s MV Peak grad:  5.3 mmHg MV Mean grad:  3.0 mmHg     SHUNTS MV Vmax:       1.15 m/s     Systemic VTI:  0.17 m MV Vmean:      88.5 cm/s    Systemic Diam:  2.20 cm MV Decel Time: 149 msec MV E velocity: 115.00 cm/s Antionette Kirks MD Electronically signed by Antionette Kirks MD Signature Date/Time: 07/11/2023/3:19:58 PM    Final    DG Chest 1 View Result Date: 07/10/2023 CLINICAL DATA:  10026 Shortness of breath 10026 EXAM: CHEST  1 VIEW COMPARISON:  CT chest 02/09/2020, chest x-ray 01/28/2023 FINDINGS: Cardiomegaly. The heart and mediastinal contours are unchanged given AP portable technique. Atherosclerotic plaque. No focal consolidation. No pulmonary edema. Left lower lung zone airspace opacity. No pneumothorax. No acute osseous abnormality. IMPRESSION: Left lower lung zone airspace opacity. Finding may represent combination of pulmonary disease and pleural effusion. Recommend repeat chest x-ray PA and lateral view of the chest for further evaluation. Electronically Signed   By: Morgane  Naveau M.D.   On: 07/10/2023 20:50     Medications:    cefTRIAXone  (ROCEPHIN )  IV 1 g (07/11/23 2139)    apixaban   5 mg Oral BID   aspirin  EC  81 mg Oral Daily   atorvastatin   40 mg Oral Daily   [START ON 07/13/2023] azithromycin   500 mg Oral Daily   Chlorhexidine  Gluconate Cloth  6 each Topical Q0600   cinacalcet  30 mg Oral Q breakfast   fluticasone  furoate-vilanterol  1 puff Inhalation Daily   furosemide   40 mg Intravenous Once   gabapentin   300 mg Oral  QHS   insulin  aspart  0-20 Units Subcutaneous TID WC   insulin  aspart  0-5 Units Subcutaneous QHS   levothyroxine   112 mcg Oral Q0600   metoprolol  succinate  25 mg Oral QHS   sodium chloride  flush  3 mL Intravenous Q12H   acetaminophen , guaiFENesin -dextromethorphan , hydrALAZINE , ondansetron  (ZOFRAN ) IV, mouth rinse, sodium chloride  flush  Assessment/ Plan:  Nicole Schmidt is a 58 y.o.  female  with past medical histories including hypertension, diabetes, hypothyroidism, atrial fibrillation, morbid obesity, and end-stage renal disease on hemodialysis. She presents with cough and weakness and has been admitted for Pleural effusion [J90] Atrial fibrillation with rapid ventricular response (HCC) [I48.91] Community acquired pneumonia of left lower lobe of lung [J18.9] CAP (community acquired pneumonia) [J18.9]  [facility] [place]/TTS/ Lt AVF  End stage renal disease on hemodialysis. Last treatment on Tuesday. Will plan for treatment today, UF goal 2.5L. Next treatment scheduled for Saturday.   2. Anemia of chronic kidney disease Lab Results  Component Value Date   HGB 11.2 (L) 07/12/2023    Patient receives Mircera at outpatient clinic. Hgb within optimal range.   3. Secondary Hyperparathyroidism: with outpatient labs: none Lab Results  Component Value Date   PTH 94 (H) 11/18/2017   CALCIUM  8.5 (L) 07/12/2023   CAION 1.02 (L) 04/28/2022   PHOS 5.1 (H) 04/14/2021    Calcium  acceptable. Will monitor bone minerals during this admission.   4. Hypertension with chronic kidney disease. Home regimen includes clonidine  and metoprolol . Midodrine  ordered with dialysis.    LOS: 1 Brinkley Peet 5/29/20252:00 PM

## 2023-07-12 NOTE — Plan of Care (Signed)
   Problem: Education: Goal: Ability to describe self-care measures that may prevent or decrease complications (Diabetes Survival Skills Education) will improve Outcome: Progressing Goal: Individualized Educational Video(s) Outcome: Progressing   Problem: Coping: Goal: Ability to adjust to condition or change in health will improve Outcome: Progressing   Problem: Fluid Volume: Goal: Ability to maintain a balanced intake and output will improve Outcome: Progressing   Problem: Health Behavior/Discharge Planning: Goal: Ability to identify and utilize available resources and services will improve Outcome: Progressing Goal: Ability to manage health-related needs will improve Outcome: Progressing   Problem: Metabolic: Goal: Ability to maintain appropriate glucose levels will improve Outcome: Progressing   Problem: Nutritional: Goal: Maintenance of adequate nutrition will improve Outcome: Progressing Goal: Progress toward achieving an optimal weight will improve Outcome: Progressing   Problem: Skin Integrity: Goal: Risk for impaired skin integrity will decrease Outcome: Progressing   Problem: Tissue Perfusion: Goal: Adequacy of tissue perfusion will improve Outcome: Progressing   Problem: Education: Goal: Knowledge of General Education information will improve Description: Including pain rating scale, medication(s)/side effects and non-pharmacologic comfort measures Outcome: Progressing   Problem: Health Behavior/Discharge Planning: Goal: Ability to manage health-related needs will improve Outcome: Progressing   Problem: Clinical Measurements: Goal: Ability to maintain clinical measurements within normal limits will improve Outcome: Progressing Goal: Will remain free from infection Outcome: Progressing Goal: Diagnostic test results will improve Outcome: Progressing Goal: Respiratory complications will improve Outcome: Progressing Goal: Cardiovascular complication will  be avoided Outcome: Progressing   Problem: Activity: Goal: Risk for activity intolerance will decrease Outcome: Progressing   Problem: Nutrition: Goal: Adequate nutrition will be maintained Outcome: Progressing   Problem: Coping: Goal: Level of anxiety will decrease Outcome: Progressing   Problem: Elimination: Goal: Will not experience complications related to bowel motility Outcome: Progressing Goal: Will not experience complications related to urinary retention Outcome: Progressing   Problem: Pain Managment: Goal: General experience of comfort will improve and/or be controlled Outcome: Progressing   Problem: Safety: Goal: Ability to remain free from injury will improve Outcome: Progressing   Problem: Skin Integrity: Goal: Risk for impaired skin integrity will decrease Outcome: Progressing   Problem: Education: Goal: Knowledge of disease or condition will improve Outcome: Progressing Goal: Understanding of medication regimen will improve Outcome: Progressing Goal: Individualized Educational Video(s) Outcome: Progressing   Problem: Activity: Goal: Ability to tolerate increased activity will improve Outcome: Progressing   Problem: Cardiac: Goal: Ability to achieve and maintain adequate cardiopulmonary perfusion will improve Outcome: Progressing   Problem: Health Behavior/Discharge Planning: Goal: Ability to safely manage health-related needs after discharge will improve Outcome: Progressing

## 2023-07-12 NOTE — Progress Notes (Signed)
 PROGRESS NOTE    Nicole Schmidt  LKG:[id number] DOB: 12-09-1965 DOA: 07/10/2023 PCP: Eartha Gold, MD   Assessment & Plan:   Principal Problem:   CAP/Possible aspiration (community acquired pneumonia) Active Problems:   Acute on chronic heart failure with preserved ejection fraction (HFpEF) (HCC)   Acute respiratory failure with hypoxia and hypercapnia (HCC)   Restrictive lung disease   COPD (chronic obstructive pulmonary disease) (HCC)   Atrial fibrillation with rapid ventricular response (HCC)   Chronic anticoagulation   History of pulmonary embolus (PE)   ESRD on hemodialysis (HCC)   History of breast cancer   Essential hypertension   Morbid obesity with BMI of 60.0-69.9, adult (HCC)   Hypothyroidism   Diabetes mellitus, type II (HCC)   (HFpEF) heart failure with preserved ejection fraction (HCC)  Assessment and Plan:  CAP: continue on IV rocephin , azithromycin , bronchodilators & encourage incentive spirometry . Tussionex prn   Acute on chronic hypoxic respiratory failure: only uses oxygen  at night as per pt. Continue on supplemental oxygen  and wean as tolerated. Oxy desat test will be done today   Acute on chronic diastolic CHF: fluid/volume management w/ HD. Continue on metoprolol .     Likely PAF: w/ RVR. Continue on metoprolol , eliquis     Hx of pulmonary embolus: continue on eliquis     ESRD: on HD TTS. Nephro following and recs apprec     DM2: poorly controlled, HbA1c 8.3. Continue on SSI w/ accuchecks  Macrocytic anemia: folate is WNL. Will start po B12 supplement as B12 is 443.    Hypothyroidism: continue on home dose of levothyroxine     Morbid obesity: BMI 41.8. Complicates overall care & prognosis   HTN: continue on clonidine , metoprolol     Hx of breast cancer: management per onco outpatient        DVT prophylaxis: eliquis   Code Status: full  Family Communication:  Disposition Plan: likely d/c back home  Status is: Inpatient Remains  inpatient appropriate because: requiring IV abxs    Level of care: Progressive Consultants:    Procedures:   Antimicrobials: rocephin , azithromycin   Subjective: Pt c/o malaise   Objective: Vitals:   07/11/23 2308 07/12/23 0413 07/12/23 0500 07/12/23 0807  BP: (!) 122/51 95/76  (!) 146/84  Pulse: (!) 104   96  Resp: 18 20  18   Temp: 98.4 F (36.9 C) 98.1 F (36.7 C)  98.1 F (36.7 C)  TempSrc: Oral   Oral  SpO2: 100% 93%  96%  Weight:   (!) 163.7 kg   Height:        Intake/Output Summary (Last 24 hours) at 07/12/2023 0903 Last data filed at 07/12/2023 0109 Gross per 24 hour  Intake 480 ml  Output --  Net 480 ml   Filed Weights   07/10/23 2352 07/11/23 0500 07/12/23 0500  Weight: 70.4 kg 70.2 kg (!) 163.7 kg    Examination:  General exam:appears comfortable. Morbidly obese   Respiratory system: diminished breath sounds b/l  Cardiovascular system: S1/S2+. No rubs or clicks   Gastrointestinal system: Abd is soft, NT, obese & hypoactive bowel sounds  Central nervous system: alert & oriented. Moves all extremities  Psychiatry: Judgement and insight appears normal. Appropriate mood and affect     Data Reviewed: I have personally reviewed following labs and imaging studies  CBC: Recent Labs  Lab 07/10/23 2025 07/11/23 0258 07/12/23 0458  WBC 10.2 9.4 12.1*  HGB 11.6* 11.2* 11.2*  HCT 36.6 34.9* 34.8*  MCV 114.4* 114.8* 112.6*  PLT 177 166 171   Basic Metabolic Panel: Recent Labs  Lab 07/10/23 2025 07/11/23 0258 07/12/23 0458  NA 136 137 135  K 4.1 4.1 4.4  CL 94* 95* 93*  CO2 30 28 27   GLUCOSE 189* 192* 194*  BUN 34* 39* 62*  CREATININE 5.54* 6.18* 7.94*  CALCIUM  8.7* 8.1* 8.5*   GFR: Estimated Creatinine Clearance: 11.3 mL/min (A) (by C-G formula based on SCr of 7.94 mg/dL (H)). Liver Function Tests: No results for input(s): "AST", "ALT", "ALKPHOS", "BILITOT", "PROT", "ALBUMIN " in the last 168 hours. No results for input(s): "LIPASE",  "AMYLASE" in the last 168 hours. No results for input(s): "AMMONIA" in the last 168 hours. Coagulation Profile: No results for input(s): "INR", "PROTIME" in the last 168 hours. Cardiac Enzymes: No results for input(s): "CKTOTAL", "CKMB", "CKMBINDEX", "TROPONINI" in the last 168 hours. BNP (last 3 results) No results for input(s): "PROBNP" in the last 8760 hours. HbA1C: Recent Labs    07/11/23 0258  HGBA1C 8.3*   CBG: Recent Labs  Lab 07/11/23 0752 07/11/23 1200 07/11/23 1637 07/11/23 2029 07/12/23 0806  GLUCAP 143* 202* 141* 164* 143*   Lipid Profile: No results for input(s): "CHOL", "HDL", "LDLCALC", "TRIG", "CHOLHDL", "LDLDIRECT" in the last 72 hours. Thyroid  Function Tests: No results for input(s): "TSH", "T4TOTAL", "FREET4", "T3FREE", "THYROIDAB" in the last 72 hours. Anemia Panel: Recent Labs    07/12/23 0458  FOLATE 35.0   Sepsis Labs: No results for input(s): "PROCALCITON", "LATICACIDVEN" in the last 168 hours.  Recent Results (from the past 240 hours)  Resp panel by RT-PCR (RSV, Flu A&B, Covid) Anterior Nasal Swab     Status: None   Collection Time: 07/10/23  8:33 PM   Specimen: Anterior Nasal Swab  Result Value Ref Range Status   SARS Coronavirus 2 by RT PCR NEGATIVE NEGATIVE Final    Comment: (NOTE) SARS-CoV-2 target nucleic acids are NOT DETECTED.  The SARS-CoV-2 RNA is generally detectable in upper respiratory specimens during the acute phase of infection. The lowest concentration of SARS-CoV-2 viral copies this assay can detect is 138 copies/mL. A negative result does not preclude SARS-Cov-2 infection and should not be used as the sole basis for treatment or other patient management decisions. A negative result may occur with  improper specimen collection/handling, submission of specimen other than nasopharyngeal swab, presence of viral mutation(s) within the areas targeted by this assay, and inadequate number of viral copies(<138 copies/mL). A  negative result must be combined with clinical observations, patient history, and epidemiological information. The expected result is Negative.  Fact Sheet for Patients:  BloggerCourse.com  Fact Sheet for Healthcare Providers:  SeriousBroker.it  This test is no t yet approved or cleared by the [place]  FDA and  has been authorized for detection and/or diagnosis of SARS-CoV-2 by FDA under an Emergency Use Authorization (EUA). This EUA will remain  in effect (meaning this test can be used) for the duration of the COVID-19 declaration under Section 564(b)(1) of the Act, 21 U.S.C.section 360bbb-3(b)(1), unless the authorization is terminated  or revoked sooner.       Influenza A by PCR NEGATIVE NEGATIVE Final   Influenza B by PCR NEGATIVE NEGATIVE Final    Comment: (NOTE) The Xpert Xpress SARS-CoV-2/FLU/RSV plus assay is intended as an aid in the diagnosis of influenza from Nasopharyngeal swab specimens and should not be used as a sole basis for treatment. Nasal washings and aspirates are unacceptable for Xpert Xpress SARS-CoV-2/FLU/RSV testing.  Fact Sheet for Patients: BloggerCourse.com  Fact Sheet for Healthcare Providers: SeriousBroker.it  This test is not yet approved or cleared by the [place]  FDA and has been authorized for detection and/or diagnosis of SARS-CoV-2 by FDA under an Emergency Use Authorization (EUA). This EUA will remain in effect (meaning this test can be used) for the duration of the COVID-19 declaration under Section 564(b)(1) of the Act, 21 U.S.C. section 360bbb-3(b)(1), unless the authorization is terminated or revoked.     Resp Syncytial Virus by PCR NEGATIVE NEGATIVE Final    Comment: (NOTE) Fact Sheet for Patients: BloggerCourse.com  Fact Sheet for Healthcare  Providers: SeriousBroker.it  This test is not yet approved or cleared by the [place]  FDA and has been authorized for detection and/or diagnosis of SARS-CoV-2 by FDA under an Emergency Use Authorization (EUA). This EUA will remain in effect (meaning this test can be used) for the duration of the COVID-19 declaration under Section 564(b)(1) of the Act, 21 U.S.C. section 360bbb-3(b)(1), unless the authorization is terminated or revoked.  Performed at [facility], [address], [place], [place] [address]          Radiology Studies: ECHOCARDIOGRAM COMPLETE Result Date: 07/11/2023    ECHOCARDIOGRAM REPORT   Patient Name:   Nicole Schmidt Date of Exam: 07/11/2023 Medical Rec #:  [id number]      Height:       5.1 in Accession #:    [id number]     Weight:       355.8 lb Date of Birth:  04/22/1965       BSA:          0.400 m Patient Age:    57 years       BP:           115/60 mmHg Patient Gender: F              HR:           87 bpm. Exam Location:  [facility] Procedure: 2D Echo, Cardiac Doppler, Color Doppler and Intracardiac            Opacification Agent (Both Spectral and Color Flow Doppler were            utilized during procedure). Indications:     CHF-acute diastolic  History:         Patient has prior history of Echocardiogram examinations, most                  recent 05/18/2021. CHF, COPD, Arrythmias:Atrial Fibrillation;                  Risk Factors:Hypertension, Diabetes, Dyslipidemia and Sleep                  Apnea. CKD stage 3.  Sonographer:     Aldon Ambrosia Referring Phys:  [id number] Lanetta Pion Diagnosing Phys: Antionette Kirks MD  Sonographer Comments: Image acquisition challenging due to patient body habitus. IMPRESSIONS  1. Left ventricular ejection fraction, by estimation, is 55 to 60%. The left ventricle has normal function. Left ventricular endocardial border not optimally defined to evaluate regional wall motion. There is mild left ventricular  hypertrophy. Left ventricular diastolic parameters are indeterminate.  2. Right ventricular systolic function is normal. The right ventricular size is normal. There is moderately elevated pulmonary artery systolic pressure.  3. Left atrial size was mildly dilated.  4. The mitral valve is normal in structure. No evidence of mitral valve regurgitation. No evidence of mitral stenosis.  5. The aortic valve is normal in structure. Aortic valve regurgitation is not visualized. No aortic stenosis is present. FINDINGS  Left Ventricle: Left ventricular ejection fraction, by estimation, is 55 to 60%. The left ventricle has normal function. Left ventricular endocardial border not optimally defined to evaluate regional wall motion. The left ventricular internal cavity size was normal in size. There is mild left ventricular hypertrophy. Left ventricular diastolic parameters are indeterminate. Right Ventricle: The right ventricular size is normal. No increase in right ventricular wall thickness. Right ventricular systolic function is normal. There is moderately elevated pulmonary artery systolic pressure. The tricuspid regurgitant velocity is 3.36 m/s, and with an assumed right atrial pressure of 5 mmHg, the estimated right ventricular systolic pressure is 50.2 mmHg. Left Atrium: Left atrial size was mildly dilated. Right Atrium: Right atrial size was normal in size. Pericardium: There is no evidence of pericardial effusion. Mitral Valve: The mitral valve is normal in structure. No evidence of mitral valve regurgitation. No evidence of mitral valve stenosis. MV peak gradient, 5.3 mmHg. The mean mitral valve gradient is 3.0 mmHg. Tricuspid Valve: The tricuspid valve is normal in structure. Tricuspid valve regurgitation is mild . No evidence of tricuspid stenosis. Aortic Valve: The aortic valve is normal in structure. Aortic valve regurgitation is not visualized. No aortic stenosis is present. Aortic valve mean gradient measures  3.0 mmHg. Aortic valve peak gradient measures 7.3 mmHg. Aortic valve area, by VTI measures 3.28 cm. Pulmonic Valve: The pulmonic valve was normal in structure. Pulmonic valve regurgitation is not visualized. No evidence of pulmonic stenosis. Aorta: The aortic root is normal in size and structure. Venous: The inferior vena cava was not well visualized. IAS/Shunts: No atrial level shunt detected by color flow Doppler.  LEFT VENTRICLE PLAX 2D LVIDd:         4.90 cm      Diastology LVIDs:         3.30 cm      LV e' medial:    8.81 cm/s LV PW:         1.30 cm      LV E/e' medial:  13.1 LV IVS:        0.90 cm      LV e' lateral:   12.40 cm/s LVOT diam:     2.20 cm      LV E/e' lateral: 9.3 LV SV:         65 LV SV Index:   162 LVOT Area:     3.80 cm  LV Volumes (MOD) LV vol d, MOD A4C: 403.0 ml LV vol s, MOD A4C: 87.8 ml LV SV MOD A4C:     403.0 ml RIGHT VENTRICLE RV Basal diam:  4.70 cm RV Mid diam:    3.20 cm RV S prime:     10.30 cm/s TAPSE (M-mode): 2.0 cm LEFT ATRIUM             Index         RIGHT ATRIUM           Index LA diam:        4.20 cm 10.49 cm/m   RA Area:     16.50 cm LA Vol (A2C):   59.0 ml 147.42 ml/m  RA Volume:   37.60 ml  93.95 ml/m LA Vol (A4C):   86.3 ml 215.63 ml/m LA Biplane Vol: 78.1 ml 195.14 ml/m  AORTIC VALVE  PULMONIC VALVE AV Area (Vmax):    2.87 cm     PV Vmax:       0.95 m/s AV Area (Vmean):   2.96 cm     PV Peak grad:  3.6 mmHg AV Area (VTI):     3.28 cm AV Vmax:           135.00 cm/s AV Vmean:          84.000 cm/s AV VTI:            0.198 m AV Peak Grad:      7.3 mmHg AV Mean Grad:      3.0 mmHg LVOT Vmax:         102.00 cm/s LVOT Vmean:        65.400 cm/s LVOT VTI:          0.171 m LVOT/AV VTI ratio: 0.86  AORTA Ao Root diam: 2.80 cm Ao Asc diam:  3.00 cm MITRAL VALVE                TRICUSPID VALVE MV Area (PHT): 5.09 cm     TR Peak grad:   45.2 mmHg MV Area VTI:   5.12 cm     TR Vmax:        336.00 cm/s MV Peak grad:  5.3 mmHg MV Mean grad:  3.0 mmHg      SHUNTS MV Vmax:       1.15 m/s     Systemic VTI:  0.17 m MV Vmean:      88.5 cm/s    Systemic Diam: 2.20 cm MV Decel Time: 149 msec MV E velocity: 115.00 cm/s Antionette Kirks MD Electronically signed by Antionette Kirks MD Signature Date/Time: 07/11/2023/3:19:58 PM    Final    DG Chest 1 View Result Date: 07/10/2023 CLINICAL DATA:  10026 Shortness of breath 10026 EXAM: CHEST  1 VIEW COMPARISON:  CT chest 02/09/2020, chest x-ray 01/28/2023 FINDINGS: Cardiomegaly. The heart and mediastinal contours are unchanged given AP portable technique. Atherosclerotic plaque. No focal consolidation. No pulmonary edema. Left lower lung zone airspace opacity. No pneumothorax. No acute osseous abnormality. IMPRESSION: Left lower lung zone airspace opacity. Finding may represent combination of pulmonary disease and pleural effusion. Recommend repeat chest x-ray PA and lateral view of the chest for further evaluation. Electronically Signed   By: Morgane  Naveau M.D.   On: 07/10/2023 20:50        Scheduled Meds:  apixaban   5 mg Oral BID   aspirin  EC  81 mg Oral Daily   atorvastatin   40 mg Oral Daily   cinacalcet  30 mg Oral Q breakfast   fluticasone  furoate-vilanterol  1 puff Inhalation Daily   furosemide   40 mg Intravenous Once   gabapentin   300 mg Oral QHS   insulin  aspart  0-20 Units Subcutaneous TID WC   insulin  aspart  0-5 Units Subcutaneous QHS   levothyroxine   112 mcg Oral Q0600   metoprolol  succinate  25 mg Oral QHS   sodium chloride  flush  3 mL Intravenous Q12H   Continuous Infusions:  azithromycin  500 mg (07/12/23 0054)   cefTRIAXone  (ROCEPHIN )  IV 1 g (07/11/23 2139)     LOS: 1 day      Alphonsus Jeans, MD [facility] Pager [phone]  If 7PM-7AM, please contact night-coverage [email or web address] Password [email or web address] 07/12/2023, 9:03 AM

## 2023-07-12 NOTE — Progress Notes (Signed)
 PHARMACIST - PHYSICIAN COMMUNICATION DR:   Broadus Canes CONCERNING: Antibiotic IV to Oral Route Change Policy  RECOMMENDATION: This patient is receiving Azithromycin  by the intravenous route.  Based on criteria approved by the Pharmacy and Therapeutics Committee, the antibiotic(s) is/are being converted to the equivalent oral dose form(s).   DESCRIPTION: These criteria include: Patient being treated for a respiratory tract infection, urinary tract infection, cellulitis or clostridium difficile associated diarrhea if on metronidazole The patient is not neutropenic and does not exhibit a GI malabsorption state The patient is eating (either orally or via tube) and/or has been taking other orally administered medications for a least 24 hours The patient is improving clinically and has a Tmax < 100.5   Tiny Rietz Rodriguez-Guzman PharmD, BCPS 07/12/2023 12:03 PM

## 2023-07-13 DIAGNOSIS — J189 Pneumonia, unspecified organism: Secondary | ICD-10-CM | POA: Diagnosis not present

## 2023-07-13 LAB — CBC
HCT: 36.9 % (ref 36.0–46.0)
Hemoglobin: 11.6 g/dL — ABNORMAL LOW (ref 12.0–15.0)
MCH: 35.7 pg — ABNORMAL HIGH (ref 26.0–34.0)
MCHC: 31.4 g/dL (ref 30.0–36.0)
MCV: 113.5 fL — ABNORMAL HIGH (ref 80.0–100.0)
Platelets: 173 10*3/uL (ref 150–400)
RBC: 3.25 MIL/uL — ABNORMAL LOW (ref 3.87–5.11)
RDW: 14.6 % (ref 11.5–15.5)
WBC: 11.2 10*3/uL — ABNORMAL HIGH (ref 4.0–10.5)
nRBC: 0 % (ref 0.0–0.2)

## 2023-07-13 LAB — GLUCOSE, CAPILLARY
Glucose-Capillary: 156 mg/dL — ABNORMAL HIGH (ref 70–99)
Glucose-Capillary: 155 mg/dL — ABNORMAL HIGH (ref 70–99)
Glucose-Capillary: 150 mg/dL — ABNORMAL HIGH (ref 70–99)
Glucose-Capillary: 167 mg/dL — ABNORMAL HIGH (ref 70–99)

## 2023-07-13 LAB — HEPATITIS B SURFACE ANTIBODY, QUANTITATIVE: Hep B S AB Quant (Post): 14.8 m[IU]/mL

## 2023-07-13 MED ORDER — LIDOCAINE-PRILOCAINE 2.5-2.5 % EX CREA: 1.0000 | TOPICAL_CREAM | CUTANEOUS | Status: DC | PRN

## 2023-07-13 MED ORDER — HEPARIN SODIUM (PORCINE) 1000 UNIT/ML DIALYSIS: 1000.0000 [IU] | INTRAMUSCULAR | Status: DC | PRN

## 2023-07-13 MED ORDER — ATROPINE SULFATE 1 MG/10ML IJ SOSY: 1.0000 mg | PREFILLED_SYRINGE | Freq: Once | INTRAMUSCULAR | Status: DC | PRN

## 2023-07-13 MED ORDER — BUTALBITAL-APAP-CAFFEINE 50-325-40 MG PO TABS
1.0000 | ORAL_TABLET | ORAL | Status: DC | PRN
  Administered 2023-07-13: 1 via ORAL
  Filled 2023-07-13: qty 1

## 2023-07-13 MED ORDER — PENTAFLUOROPROP-TETRAFLUOROETH EX AERO: 1.0000 | INHALATION_SPRAY | CUTANEOUS | Status: DC | PRN

## 2023-07-13 NOTE — Progress Notes (Signed)
 PROGRESS NOTE    Nicole Schmidt  ZOX:[id number] DOB: Dec 17, 1965 DOA: 07/10/2023 PCP: Eartha Gold, MD   Assessment & Plan:   Principal Problem:   CAP/Possible aspiration (community acquired pneumonia) Active Problems:   Acute on chronic heart failure with preserved ejection fraction (HFpEF) (HCC)   Acute respiratory failure with hypoxia and hypercapnia (HCC)   Restrictive lung disease   COPD (chronic obstructive pulmonary disease) (HCC)   Atrial fibrillation with rapid ventricular response (HCC)   Chronic anticoagulation   History of pulmonary embolus (PE)   ESRD on hemodialysis (HCC)   History of breast cancer   Essential hypertension   Morbid obesity with BMI of 60.0-69.9, adult (HCC)   Hypothyroidism   Diabetes mellitus, type II (HCC)   (HFpEF) heart failure with preserved ejection fraction (HCC)  Assessment and Plan:  CAP: continue on IV rocephin , azithromycin , bronchodilators & encourage incentive spirometry. Tussionex prn   Acute on chronic hypoxic respiratory failure: only uses oxygen  at night as per pt. Continue on supplemental oxygen  and wean as tolerated. Pt refused oxygen  desat to be done yesterday so will attempt again today   Acute on chronic diastolic CHF: fluid/volume management w/ HD. Continue on metoprolol      Likely PAF: w/ RVR. Continue on eliquis , metoprolol     Hx of pulmonary embolus: continue on eliquis     ESRD: on HD TTS. Nephro following and recs apprec     DM2: poorly controlled, HbA1c 8.3. Continue on SSI w/ accuchecks   Macrocytic anemia: folate is WNL. B12 deficiency, continue on po B12 supplement    Hypothyroidism: continue on home dose of levothyroxine     Morbid obesity: BMI 41.8. Complicates overall care & prognosis   HTN: continue on metoprolol , clonidine     Hx of breast cancer: management per onco outpatient        DVT prophylaxis: eliquis   Code Status: full  Family Communication:  Disposition Plan: likely d/c back  home  Status is: Inpatient Remains inpatient appropriate because: nephro wants pt to get another session of HD tomorrow and likely d/c home after HD is complete     Level of care: Progressive Consultants:    Procedures:   Antimicrobials: rocephin , azithromycin   Subjective: Pt c/o malaise   Objective: Vitals:   07/12/23 2001 07/12/23 2323 07/13/23 0417 07/13/23 0829  BP: (!) 140/68 123/61 138/63 (!) 151/73  Pulse: (!) 108 (!) 105 96 98  Resp: 18 18 18    Temp: 98.1 F (36.7 C) 98 F (36.7 C) (!) 97.3 F (36.3 C) 98.8 F (37.1 C)  TempSrc:    Oral  SpO2: 98% 99% 97% 98%  Weight:      Height:        Intake/Output Summary (Last 24 hours) at 07/13/2023 0842 Last data filed at 07/13/2023 8119 Gross per 24 hour  Intake 680 ml  Output 2500 ml  Net -1820 ml   Filed Weights   07/10/23 2352 07/11/23 0500 07/12/23 0500  Weight: 70.4 kg 70.2 kg (!) 163.7 kg    Examination:  General exam: appears calm & comfortable  Respiratory system: decreased breath sounds b/l  Cardiovascular system: S1 & S2+. No gallops or rubs  Gastrointestinal system: Abd is soft, NT, obese & hypoactive bowel sounds  Central nervous system: alert & oriented. Moves all extremities  Psychiatry: Judgement and insight appears at baseline. Flat mood and affect     Data Reviewed: I have personally reviewed following labs and imaging studies  CBC: Recent Labs  Lab  07/10/23 2025 07/11/23 0258 07/12/23 0458 07/13/23 0619  WBC 10.2 9.4 12.1* 11.2*  HGB 11.6* 11.2* 11.2* 11.6*  HCT 36.6 34.9* 34.8* 36.9  MCV 114.4* 114.8* 112.6* 113.5*  PLT 177 166 171 173   Basic Metabolic Panel: Recent Labs  Lab 07/10/23 2025 07/11/23 0258 07/12/23 0458  NA 136 137 135  K 4.1 4.1 4.4  CL 94* 95* 93*  CO2 30 28 27   GLUCOSE 189* 192* 194*  BUN 34* 39* 62*  CREATININE 5.54* 6.18* 7.94*  CALCIUM  8.7* 8.1* 8.5*   GFR: Estimated Creatinine Clearance: 11.3 mL/min (A) (by C-G formula based on SCr of 7.94  mg/dL (H)). Liver Function Tests: No results for input(s): "AST", "ALT", "ALKPHOS", "BILITOT", "PROT", "ALBUMIN " in the last 168 hours. No results for input(s): "LIPASE", "AMYLASE" in the last 168 hours. No results for input(s): "AMMONIA" in the last 168 hours. Coagulation Profile: No results for input(s): "INR", "PROTIME" in the last 168 hours. Cardiac Enzymes: No results for input(s): "CKTOTAL", "CKMB", "CKMBINDEX", "TROPONINI" in the last 168 hours. BNP (last 3 results) No results for input(s): "PROBNP" in the last 8760 hours. HbA1C: Recent Labs    07/11/23 0258  HGBA1C 8.3*   CBG: Recent Labs  Lab 07/11/23 2029 07/12/23 0806 07/12/23 1622 07/12/23 2043 07/13/23 0826  GLUCAP 164* 143* 119* 204* 156*   Lipid Profile: No results for input(s): "CHOL", "HDL", "LDLCALC", "TRIG", "CHOLHDL", "LDLDIRECT" in the last 72 hours. Thyroid  Function Tests: No results for input(s): "TSH", "T4TOTAL", "FREET4", "T3FREE", "THYROIDAB" in the last 72 hours. Anemia Panel: Recent Labs    07/12/23 0458  VITAMINB12 443  FOLATE 35.0   Sepsis Labs: No results for input(s): "PROCALCITON", "LATICACIDVEN" in the last 168 hours.  Recent Results (from the past 240 hours)  Resp panel by RT-PCR (RSV, Flu A&B, Covid) Anterior Nasal Swab     Status: None   Collection Time: 07/10/23  8:33 PM   Specimen: Anterior Nasal Swab  Result Value Ref Range Status   SARS Coronavirus 2 by RT PCR NEGATIVE NEGATIVE Final    Comment: (NOTE) SARS-CoV-2 target nucleic acids are NOT DETECTED.  The SARS-CoV-2 RNA is generally detectable in upper respiratory specimens during the acute phase of infection. The lowest concentration of SARS-CoV-2 viral copies this assay can detect is 138 copies/mL. A negative result does not preclude SARS-Cov-2 infection and should not be used as the sole basis for treatment or other patient management decisions. A negative result may occur with  improper specimen collection/handling,  submission of specimen other than nasopharyngeal swab, presence of viral mutation(s) within the areas targeted by this assay, and inadequate number of viral copies(<138 copies/mL). A negative result must be combined with clinical observations, patient history, and epidemiological information. The expected result is Negative.  Fact Sheet for Patients:  BloggerCourse.com  Fact Sheet for Healthcare Providers:  SeriousBroker.it  This test is no t yet approved or cleared by the [place]  FDA and  has been authorized for detection and/or diagnosis of SARS-CoV-2 by FDA under an Emergency Use Authorization (EUA). This EUA will remain  in effect (meaning this test can be used) for the duration of the COVID-19 declaration under Section 564(b)(1) of the Act, 21 U.S.C.section 360bbb-3(b)(1), unless the authorization is terminated  or revoked sooner.       Influenza A by PCR NEGATIVE NEGATIVE Final   Influenza B by PCR NEGATIVE NEGATIVE Final    Comment: (NOTE) The Xpert Xpress SARS-CoV-2/FLU/RSV plus assay is intended as an aid in  the diagnosis of influenza from Nasopharyngeal swab specimens and should not be used as a sole basis for treatment. Nasal washings and aspirates are unacceptable for Xpert Xpress SARS-CoV-2/FLU/RSV testing.  Fact Sheet for Patients: BloggerCourse.com  Fact Sheet for Healthcare Providers: SeriousBroker.it  This test is not yet approved or cleared by the [place]  FDA and has been authorized for detection and/or diagnosis of SARS-CoV-2 by FDA under an Emergency Use Authorization (EUA). This EUA will remain in effect (meaning this test can be used) for the duration of the COVID-19 declaration under Section 564(b)(1) of the Act, 21 U.S.C. section 360bbb-3(b)(1), unless the authorization is terminated or revoked.     Resp Syncytial Virus by PCR NEGATIVE  NEGATIVE Final    Comment: (NOTE) Fact Sheet for Patients: BloggerCourse.com  Fact Sheet for Healthcare Providers: SeriousBroker.it  This test is not yet approved or cleared by the [place]  FDA and has been authorized for detection and/or diagnosis of SARS-CoV-2 by FDA under an Emergency Use Authorization (EUA). This EUA will remain in effect (meaning this test can be used) for the duration of the COVID-19 declaration under Section 564(b)(1) of the Act, 21 U.S.C. section 360bbb-3(b)(1), unless the authorization is terminated or revoked.  Performed at [facility], [address] [place], [place] [address]          Radiology Studies: ECHOCARDIOGRAM COMPLETE Result Date: 07/11/2023    ECHOCARDIOGRAM REPORT   Patient Name:   Nicole Schmidt Date of Exam: 07/11/2023 Medical Rec #:  [id number]      Height:       5.1 in Accession #:    [id number]     Weight:       355.8 lb Date of Birth:  1965-06-24       BSA:          0.400 m Patient Age:    57 years       BP:           115/60 mmHg Patient Gender: F              HR:           87 bpm. Exam Location:  [facility] Procedure: 2D Echo, Cardiac Doppler, Color Doppler and Intracardiac            Opacification Agent (Both Spectral and Color Flow Doppler were            utilized during procedure). Indications:     CHF-acute diastolic  History:         Patient has prior history of Echocardiogram examinations, most                  recent 05/18/2021. CHF, COPD, Arrythmias:Atrial Fibrillation;                  Risk Factors:Hypertension, Diabetes, Dyslipidemia and Sleep                  Apnea. CKD stage 3.  Sonographer:     Aldon Ambrosia Referring Phys:  [id number] Lanetta Pion Diagnosing Phys: Antionette Kirks MD  Sonographer Comments: Image acquisition challenging due to patient body habitus. IMPRESSIONS  1. Left ventricular ejection fraction, by estimation, is 55 to 60%. The left ventricle has normal  function. Left ventricular endocardial border not optimally defined to evaluate regional wall motion. There is mild left ventricular hypertrophy. Left ventricular diastolic parameters are indeterminate.  2. Right ventricular systolic function is normal. The right ventricular size is normal.  There is moderately elevated pulmonary artery systolic pressure.  3. Left atrial size was mildly dilated.  4. The mitral valve is normal in structure. No evidence of mitral valve regurgitation. No evidence of mitral stenosis.  5. The aortic valve is normal in structure. Aortic valve regurgitation is not visualized. No aortic stenosis is present. FINDINGS  Left Ventricle: Left ventricular ejection fraction, by estimation, is 55 to 60%. The left ventricle has normal function. Left ventricular endocardial border not optimally defined to evaluate regional wall motion. The left ventricular internal cavity size was normal in size. There is mild left ventricular hypertrophy. Left ventricular diastolic parameters are indeterminate. Right Ventricle: The right ventricular size is normal. No increase in right ventricular wall thickness. Right ventricular systolic function is normal. There is moderately elevated pulmonary artery systolic pressure. The tricuspid regurgitant velocity is 3.36 m/s, and with an assumed right atrial pressure of 5 mmHg, the estimated right ventricular systolic pressure is 50.2 mmHg. Left Atrium: Left atrial size was mildly dilated. Right Atrium: Right atrial size was normal in size. Pericardium: There is no evidence of pericardial effusion. Mitral Valve: The mitral valve is normal in structure. No evidence of mitral valve regurgitation. No evidence of mitral valve stenosis. MV peak gradient, 5.3 mmHg. The mean mitral valve gradient is 3.0 mmHg. Tricuspid Valve: The tricuspid valve is normal in structure. Tricuspid valve regurgitation is mild . No evidence of tricuspid stenosis. Aortic Valve: The aortic valve is  normal in structure. Aortic valve regurgitation is not visualized. No aortic stenosis is present. Aortic valve mean gradient measures 3.0 mmHg. Aortic valve peak gradient measures 7.3 mmHg. Aortic valve area, by VTI measures 3.28 cm. Pulmonic Valve: The pulmonic valve was normal in structure. Pulmonic valve regurgitation is not visualized. No evidence of pulmonic stenosis. Aorta: The aortic root is normal in size and structure. Venous: The inferior vena cava was not well visualized. IAS/Shunts: No atrial level shunt detected by color flow Doppler.  LEFT VENTRICLE PLAX 2D LVIDd:         4.90 cm      Diastology LVIDs:         3.30 cm      LV e' medial:    8.81 cm/s LV PW:         1.30 cm      LV E/e' medial:  13.1 LV IVS:        0.90 cm      LV e' lateral:   12.40 cm/s LVOT diam:     2.20 cm      LV E/e' lateral: 9.3 LV SV:         65 LV SV Index:   162 LVOT Area:     3.80 cm  LV Volumes (MOD) LV vol d, MOD A4C: 403.0 ml LV vol s, MOD A4C: 87.8 ml LV SV MOD A4C:     403.0 ml RIGHT VENTRICLE RV Basal diam:  4.70 cm RV Mid diam:    3.20 cm RV S prime:     10.30 cm/s TAPSE (M-mode): 2.0 cm LEFT ATRIUM             Index         RIGHT ATRIUM           Index LA diam:        4.20 cm 10.49 cm/m   RA Area:     16.50 cm LA Vol (A2C):   59.0 ml 147.42 ml/m  RA Volume:   37.60  ml  93.95 ml/m LA Vol (A4C):   86.3 ml 215.63 ml/m LA Biplane Vol: 78.1 ml 195.14 ml/m  AORTIC VALVE                    PULMONIC VALVE AV Area (Vmax):    2.87 cm     PV Vmax:       0.95 m/s AV Area (Vmean):   2.96 cm     PV Peak grad:  3.6 mmHg AV Area (VTI):     3.28 cm AV Vmax:           135.00 cm/s AV Vmean:          84.000 cm/s AV VTI:            0.198 m AV Peak Grad:      7.3 mmHg AV Mean Grad:      3.0 mmHg LVOT Vmax:         102.00 cm/s LVOT Vmean:        65.400 cm/s LVOT VTI:          0.171 m LVOT/AV VTI ratio: 0.86  AORTA Ao Root diam: 2.80 cm Ao Asc diam:  3.00 cm MITRAL VALVE                TRICUSPID VALVE MV Area (PHT): 5.09 cm      TR Peak grad:   45.2 mmHg MV Area VTI:   5.12 cm     TR Vmax:        336.00 cm/s MV Peak grad:  5.3 mmHg MV Mean grad:  3.0 mmHg     SHUNTS MV Vmax:       1.15 m/s     Systemic VTI:  0.17 m MV Vmean:      88.5 cm/s    Systemic Diam: 2.20 cm MV Decel Time: 149 msec MV E velocity: 115.00 cm/s Antionette Kirks MD Electronically signed by Antionette Kirks MD Signature Date/Time: 07/11/2023/3:19:58 PM    Final         Scheduled Meds:  apixaban   5 mg Oral BID   aspirin  EC  81 mg Oral Daily   atorvastatin   40 mg Oral Daily   azithromycin   500 mg Oral Daily   Chlorhexidine  Gluconate Cloth  6 each Topical Q0600   cinacalcet  30 mg Oral Q breakfast   vitamin B-12  1,000 mcg Oral Daily   fluticasone  furoate-vilanterol  1 puff Inhalation Daily   furosemide   40 mg Intravenous Once   gabapentin   300 mg Oral QHS   insulin  aspart  0-20 Units Subcutaneous TID WC   insulin  aspart  0-5 Units Subcutaneous QHS   levothyroxine   112 mcg Oral Q0600   metoprolol  succinate  25 mg Oral QHS   sodium chloride  flush  3 mL Intravenous Q12H   Continuous Infusions:  cefTRIAXone  (ROCEPHIN )  IV 200 mL/hr at 07/13/23 1610     LOS: 2 days      Alphonsus Jeans, MD [facility] Pager [phone]  If 7PM-7AM, please contact night-coverage [email or web address] 07/13/2023, 8:42 AM

## 2023-07-13 NOTE — Progress Notes (Signed)
 SATURATION QUALIFICATIONS: (This note is used to comply with regulatory documentation for home oxygen )  Patient Saturations on Room Air at Rest = 92%  Patient Saturations on Room Air while Ambulating = 82%  Patient Saturations on 2 Liters of oxygen  while Ambulating = 93%  Please briefly explain why patient needs home oxygen : Per NT, pt expressed dyspnea upon exertion with ambulation while on RA with O2 sats dropping to 82%. Pt placed back on 2L with O2 sats recovering to 93%.

## 2023-07-13 NOTE — Plan of Care (Signed)
   Problem: Education: Goal: Ability to describe self-care measures that may prevent or decrease complications (Diabetes Survival Skills Education) will improve Outcome: Progressing Goal: Individualized Educational Video(s) Outcome: Progressing   Problem: Coping: Goal: Ability to adjust to condition or change in health will improve Outcome: Progressing   Problem: Fluid Volume: Goal: Ability to maintain a balanced intake and output will improve Outcome: Progressing   Problem: Health Behavior/Discharge Planning: Goal: Ability to identify and utilize available resources and services will improve Outcome: Progressing Goal: Ability to manage health-related needs will improve Outcome: Progressing   Problem: Metabolic: Goal: Ability to maintain appropriate glucose levels will improve Outcome: Progressing   Problem: Nutritional: Goal: Maintenance of adequate nutrition will improve Outcome: Progressing Goal: Progress toward achieving an optimal weight will improve Outcome: Progressing   Problem: Skin Integrity: Goal: Risk for impaired skin integrity will decrease Outcome: Progressing   Problem: Tissue Perfusion: Goal: Adequacy of tissue perfusion will improve Outcome: Progressing   Problem: Education: Goal: Knowledge of General Education information will improve Description: Including pain rating scale, medication(s)/side effects and non-pharmacologic comfort measures Outcome: Progressing   Problem: Health Behavior/Discharge Planning: Goal: Ability to manage health-related needs will improve Outcome: Progressing   Problem: Clinical Measurements: Goal: Ability to maintain clinical measurements within normal limits will improve Outcome: Progressing Goal: Will remain free from infection Outcome: Progressing Goal: Diagnostic test results will improve Outcome: Progressing Goal: Respiratory complications will improve Outcome: Progressing Goal: Cardiovascular complication will  be avoided Outcome: Progressing   Problem: Activity: Goal: Risk for activity intolerance will decrease Outcome: Progressing   Problem: Nutrition: Goal: Adequate nutrition will be maintained Outcome: Progressing   Problem: Coping: Goal: Level of anxiety will decrease Outcome: Progressing   Problem: Elimination: Goal: Will not experience complications related to bowel motility Outcome: Progressing Goal: Will not experience complications related to urinary retention Outcome: Progressing   Problem: Pain Managment: Goal: General experience of comfort will improve and/or be controlled Outcome: Progressing   Problem: Safety: Goal: Ability to remain free from injury will improve Outcome: Progressing   Problem: Skin Integrity: Goal: Risk for impaired skin integrity will decrease Outcome: Progressing   Problem: Education: Goal: Knowledge of disease or condition will improve Outcome: Progressing Goal: Understanding of medication regimen will improve Outcome: Progressing Goal: Individualized Educational Video(s) Outcome: Progressing   Problem: Activity: Goal: Ability to tolerate increased activity will improve Outcome: Progressing   Problem: Cardiac: Goal: Ability to achieve and maintain adequate cardiopulmonary perfusion will improve Outcome: Progressing   Problem: Health Behavior/Discharge Planning: Goal: Ability to safely manage health-related needs after discharge will improve Outcome: Progressing

## 2023-07-13 NOTE — Progress Notes (Signed)
 [facility]  ROUNDING NOTE   Subjective:   Nicole Schmidt  is a 58 year old female with past medical histories including hypertension, diabetes, hypothyroidism, atrial fibrillation, morbid obesity, and end-stage renal disease on hemodialysis. She presents to [facility] with cough and weakness. She has been admitted for Pleural effusion [J90] Atrial fibrillation with rapid ventricular response (HCC) [I48.91] Community acquired pneumonia of left lower lobe of lung [J18.9] CAP (community acquired pneumonia) [J18.9]   Patient is known to our clinic and receives outpatient dialysis treatments at [facility] on a TTS schedule, overseen by Dr. Rhesa Celeste.   Patient seen sitting up in chair Remains on 2 L nasal cannula States she feels well   Objective:  Vital signs in last 24 hours:  Temp:  [97.3 F (36.3 C)-98.9 F (37.2 C)] 98.9 F (37.2 C) (05/30 1155) Pulse Rate:  [88-113] 90 (05/30 1155) Resp:  [16-22] 18 (05/30 0417) BP: (123-151)/(59-80) 128/80 (05/30 1155) SpO2:  [93 %-100 %] 100 % (05/30 1155)  Weight change:  Filed Weights   07/10/23 2352 07/11/23 0500 07/12/23 0500  Weight: 70.4 kg 70.2 kg (!) 163.7 kg    Intake/Output: I/O last 3 completed shifts: In: 720 [P.O.:720] Out: 2500 [Other:2500]   Intake/Output this shift:  Total I/O In: 440 [P.O.:240; IV Piggyback:200] Out: -   Physical Exam: General: NAD, sitting in chair  Head: Normocephalic, atraumatic. Moist oral mucosal membranes  Eyes: Anicteric  Lungs:  Diminished, [place] O2  Heart: Regular rate and rhythm  Abdomen:  Soft, nontender, obese  Extremities:  2+ peripheral edema.  Neurologic: Alert, moving all four extremities  Skin: No lesions  Access: Lt AVF    Basic Metabolic Panel: Recent Labs  Lab 07/10/23 2025 07/11/23 0258 07/12/23 0458  NA 136 137 135  K 4.1 4.1 4.4  CL 94* 95* 93*  CO2 30 28 27   GLUCOSE 189* 192* 194*  BUN 34* 39* 62*  CREATININE 5.54* 6.18* 7.94*  CALCIUM  8.7* 8.1*  8.5*    Liver Function Tests: No results for input(s): "AST", "ALT", "ALKPHOS", "BILITOT", "PROT", "ALBUMIN " in the last 168 hours. No results for input(s): "LIPASE", "AMYLASE" in the last 168 hours. No results for input(s): "AMMONIA" in the last 168 hours.  CBC: Recent Labs  Lab 07/10/23 2025 07/11/23 0258 07/12/23 0458 07/13/23 0619  WBC 10.2 9.4 12.1* 11.2*  HGB 11.6* 11.2* 11.2* 11.6*  HCT 36.6 34.9* 34.8* 36.9  MCV 114.4* 114.8* 112.6* 113.5*  PLT 177 166 171 173    Cardiac Enzymes: No results for input(s): "CKTOTAL", "CKMB", "CKMBINDEX", "TROPONINI" in the last 168 hours.  BNP: Invalid input(s): "POCBNP"  CBG: Recent Labs  Lab 07/12/23 0806 07/12/23 1622 07/12/23 2043 07/13/23 0826 07/13/23 1151  GLUCAP 143* 119* 204* 156* 155*    Microbiology: Results for orders placed or performed during the hospital encounter of 07/10/23  Resp panel by RT-PCR (RSV, Flu A&B, Covid) Anterior Nasal Swab     Status: None   Collection Time: 07/10/23  8:33 PM   Specimen: Anterior Nasal Swab  Result Value Ref Range Status   SARS Coronavirus 2 by RT PCR NEGATIVE NEGATIVE Final    Comment: (NOTE) SARS-CoV-2 target nucleic acids are NOT DETECTED.  The SARS-CoV-2 RNA is generally detectable in upper respiratory specimens during the acute phase of infection. The lowest concentration of SARS-CoV-2 viral copies this assay can detect is 138 copies/mL. A negative result does not preclude SARS-Cov-2 infection and should not be used as the sole basis for treatment or  other patient management decisions. A negative result may occur with  improper specimen collection/handling, submission of specimen other than nasopharyngeal swab, presence of viral mutation(s) within the areas targeted by this assay, and inadequate number of viral copies(<138 copies/mL). A negative result must be combined with clinical observations, patient history, and epidemiological information. The expected result  is Negative.  Fact Sheet for Patients:  BloggerCourse.com  Fact Sheet for Healthcare Providers:  SeriousBroker.it  This test is no t yet approved or cleared by the [place]  FDA and  has been authorized for detection and/or diagnosis of SARS-CoV-2 by FDA under an Emergency Use Authorization (EUA). This EUA will remain  in effect (meaning this test can be used) for the duration of the COVID-19 declaration under Section 564(b)(1) of the Act, 21 U.S.C.section 360bbb-3(b)(1), unless the authorization is terminated  or revoked sooner.       Influenza A by PCR NEGATIVE NEGATIVE Final   Influenza B by PCR NEGATIVE NEGATIVE Final    Comment: (NOTE) The Xpert Xpress SARS-CoV-2/FLU/RSV plus assay is intended as an aid in the diagnosis of influenza from Nasopharyngeal swab specimens and should not be used as a sole basis for treatment. Nasal washings and aspirates are unacceptable for Xpert Xpress SARS-CoV-2/FLU/RSV testing.  Fact Sheet for Patients: BloggerCourse.com  Fact Sheet for Healthcare Providers: SeriousBroker.it  This test is not yet approved or cleared by the [place]  FDA and has been authorized for detection and/or diagnosis of SARS-CoV-2 by FDA under an Emergency Use Authorization (EUA). This EUA will remain in effect (meaning this test can be used) for the duration of the COVID-19 declaration under Section 564(b)(1) of the Act, 21 U.S.C. section 360bbb-3(b)(1), unless the authorization is terminated or revoked.     Resp Syncytial Virus by PCR NEGATIVE NEGATIVE Final    Comment: (NOTE) Fact Sheet for Patients: BloggerCourse.com  Fact Sheet for Healthcare Providers: SeriousBroker.it  This test is not yet approved or cleared by the [place]  FDA and has been authorized for detection and/or diagnosis of  SARS-CoV-2 by FDA under an Emergency Use Authorization (EUA). This EUA will remain in effect (meaning this test can be used) for the duration of the COVID-19 declaration under Section 564(b)(1) of the Act, 21 U.S.C. section 360bbb-3(b)(1), unless the authorization is terminated or revoked.  Performed at [facility], [address] [place], [place] [address]     Coagulation Studies: No results for input(s): "LABPROT", "INR" in the last 72 hours.  Urinalysis: No results for input(s): "COLORURINE", "LABSPEC", "PHURINE", "GLUCOSEU", "HGBUR", "BILIRUBINUR", "KETONESUR", "PROTEINUR", "UROBILINOGEN", "NITRITE", "LEUKOCYTESUR" in the last 72 hours.  Invalid input(s): "APPERANCEUR"    Imaging: ECHOCARDIOGRAM COMPLETE Result Date: 07/11/2023    ECHOCARDIOGRAM REPORT   Patient Name:   VEGA STARE Date of Exam: 07/11/2023 Medical Rec #:  [id number]      Height:       5.1 in Accession #:    [id number]     Weight:       355.8 lb Date of Birth:  1965/02/27       BSA:          0.400 m Patient Age:    57 years       BP:           115/60 mmHg Patient Gender: F              HR:           87 bpm. Exam Location:  [facility] Procedure: 2D Echo,  Cardiac Doppler, Color Doppler and Intracardiac            Opacification Agent (Both Spectral and Color Flow Doppler were            utilized during procedure). Indications:     CHF-acute diastolic  History:         Patient has prior history of Echocardiogram examinations, most                  recent 05/18/2021. CHF, COPD, Arrythmias:Atrial Fibrillation;                  Risk Factors:Hypertension, Diabetes, Dyslipidemia and Sleep                  Apnea. CKD stage 3.  Sonographer:     Aldon Ambrosia Referring Phys:  [id number] Lanetta Pion Diagnosing Phys: Antionette Kirks MD  Sonographer Comments: Image acquisition challenging due to patient body habitus. IMPRESSIONS  1. Left ventricular ejection fraction, by estimation, is 55 to 60%. The left ventricle has normal function.  Left ventricular endocardial border not optimally defined to evaluate regional wall motion. There is mild left ventricular hypertrophy. Left ventricular diastolic parameters are indeterminate.  2. Right ventricular systolic function is normal. The right ventricular size is normal. There is moderately elevated pulmonary artery systolic pressure.  3. Left atrial size was mildly dilated.  4. The mitral valve is normal in structure. No evidence of mitral valve regurgitation. No evidence of mitral stenosis.  5. The aortic valve is normal in structure. Aortic valve regurgitation is not visualized. No aortic stenosis is present. FINDINGS  Left Ventricle: Left ventricular ejection fraction, by estimation, is 55 to 60%. The left ventricle has normal function. Left ventricular endocardial border not optimally defined to evaluate regional wall motion. The left ventricular internal cavity size was normal in size. There is mild left ventricular hypertrophy. Left ventricular diastolic parameters are indeterminate. Right Ventricle: The right ventricular size is normal. No increase in right ventricular wall thickness. Right ventricular systolic function is normal. There is moderately elevated pulmonary artery systolic pressure. The tricuspid regurgitant velocity is 3.36 m/s, and with an assumed right atrial pressure of 5 mmHg, the estimated right ventricular systolic pressure is 50.2 mmHg. Left Atrium: Left atrial size was mildly dilated. Right Atrium: Right atrial size was normal in size. Pericardium: There is no evidence of pericardial effusion. Mitral Valve: The mitral valve is normal in structure. No evidence of mitral valve regurgitation. No evidence of mitral valve stenosis. MV peak gradient, 5.3 mmHg. The mean mitral valve gradient is 3.0 mmHg. Tricuspid Valve: The tricuspid valve is normal in structure. Tricuspid valve regurgitation is mild . No evidence of tricuspid stenosis. Aortic Valve: The aortic valve is normal in  structure. Aortic valve regurgitation is not visualized. No aortic stenosis is present. Aortic valve mean gradient measures 3.0 mmHg. Aortic valve peak gradient measures 7.3 mmHg. Aortic valve area, by VTI measures 3.28 cm. Pulmonic Valve: The pulmonic valve was normal in structure. Pulmonic valve regurgitation is not visualized. No evidence of pulmonic stenosis. Aorta: The aortic root is normal in size and structure. Venous: The inferior vena cava was not well visualized. IAS/Shunts: No atrial level shunt detected by color flow Doppler.  LEFT VENTRICLE PLAX 2D LVIDd:         4.90 cm      Diastology LVIDs:         3.30 cm      LV e' medial:  8.81 cm/s LV PW:         1.30 cm      LV E/e' medial:  13.1 LV IVS:        0.90 cm      LV e' lateral:   12.40 cm/s LVOT diam:     2.20 cm      LV E/e' lateral: 9.3 LV SV:         65 LV SV Index:   162 LVOT Area:     3.80 cm  LV Volumes (MOD) LV vol d, MOD A4C: 403.0 ml LV vol s, MOD A4C: 87.8 ml LV SV MOD A4C:     403.0 ml RIGHT VENTRICLE RV Basal diam:  4.70 cm RV Mid diam:    3.20 cm RV S prime:     10.30 cm/s TAPSE (M-mode): 2.0 cm LEFT ATRIUM             Index         RIGHT ATRIUM           Index LA diam:        4.20 cm 10.49 cm/m   RA Area:     16.50 cm LA Vol (A2C):   59.0 ml 147.42 ml/m  RA Volume:   37.60 ml  93.95 ml/m LA Vol (A4C):   86.3 ml 215.63 ml/m LA Biplane Vol: 78.1 ml 195.14 ml/m  AORTIC VALVE                    PULMONIC VALVE AV Area (Vmax):    2.87 cm     PV Vmax:       0.95 m/s AV Area (Vmean):   2.96 cm     PV Peak grad:  3.6 mmHg AV Area (VTI):     3.28 cm AV Vmax:           135.00 cm/s AV Vmean:          84.000 cm/s AV VTI:            0.198 m AV Peak Grad:      7.3 mmHg AV Mean Grad:      3.0 mmHg LVOT Vmax:         102.00 cm/s LVOT Vmean:        65.400 cm/s LVOT VTI:          0.171 m LVOT/AV VTI ratio: 0.86  AORTA Ao Root diam: 2.80 cm Ao Asc diam:  3.00 cm MITRAL VALVE                TRICUSPID VALVE MV Area (PHT): 5.09 cm     TR Peak  grad:   45.2 mmHg MV Area VTI:   5.12 cm     TR Vmax:        336.00 cm/s MV Peak grad:  5.3 mmHg MV Mean grad:  3.0 mmHg     SHUNTS MV Vmax:       1.15 m/s     Systemic VTI:  0.17 m MV Vmean:      88.5 cm/s    Systemic Diam: 2.20 cm MV Decel Time: 149 msec MV E velocity: 115.00 cm/s Antionette Kirks MD Electronically signed by Antionette Kirks MD Signature Date/Time: 07/11/2023/3:19:58 PM    Final      Medications:    cefTRIAXone  (ROCEPHIN )  IV 200 mL/hr at 07/13/23 1128    apixaban   5 mg Oral BID   aspirin  EC  81 mg Oral Daily  atorvastatin   40 mg Oral Daily   azithromycin   500 mg Oral Daily   Chlorhexidine  Gluconate Cloth  6 each Topical Q0600   cinacalcet   30 mg Oral Q breakfast   vitamin B-12  1,000 mcg Oral Daily   fluticasone  furoate-vilanterol  1 puff Inhalation Daily   furosemide   40 mg Intravenous Once   gabapentin   300 mg Oral QHS   insulin  aspart  0-20 Units Subcutaneous TID WC   insulin  aspart  0-5 Units Subcutaneous QHS   levothyroxine   112 mcg Oral Q0600   metoprolol  succinate  25 mg Oral QHS   sodium chloride  flush  3 mL Intravenous Q12H   acetaminophen , guaiFENesin -dextromethorphan , heparin , hydrALAZINE , lidocaine -prilocaine , ondansetron  (ZOFRAN ) IV, mouth rinse, pentafluoroprop-tetrafluoroeth, sodium chloride  flush  Assessment/ Plan:  Ms. REGINA GANCI is a 58 y.o.  female  with past medical histories including hypertension, diabetes, hypothyroidism, atrial fibrillation, morbid obesity, and end-stage renal disease on hemodialysis. She presents with cough and weakness and has been admitted for Pleural effusion [J90] Atrial fibrillation with rapid ventricular response (HCC) [I48.91] Community acquired pneumonia of left lower lobe of lung [J18.9] CAP (community acquired pneumonia) [J18.9]  [facility] [place]/TTS/ Lt AVF  End stage renal disease on hemodialysis.  Patient received dialysis yesterday, UF 2.5 L achieved.  Next treatment scheduled for Saturday.   2. Anemia  of chronic kidney disease Lab Results  Component Value Date   HGB 11.6 (L) 07/13/2023    Patient receives Mircera at outpatient clinic. Hgb at goal.  No need for ESA during inpatient.  3. Secondary Hyperparathyroidism: with outpatient labs: none Lab Results  Component Value Date   PTH 94 (H) 11/18/2017   CALCIUM  8.5 (L) 07/12/2023   CAION 1.02 (L) 04/28/2022   PHOS 5.1 (H) 04/14/2021    Calcium  and phosphorus within acceptable range.  4. Hypertension with chronic kidney disease. Home regimen includes clonidine  and metoprolol . Midodrine  ordered with dialysis.    LOS: 2 Jahari Wiginton 5/30/20251:37 PM

## 2023-07-14 DIAGNOSIS — J189 Pneumonia, unspecified organism: Secondary | ICD-10-CM | POA: Diagnosis not present

## 2023-07-14 LAB — RENAL FUNCTION PANEL
Albumin: 3 g/dL — ABNORMAL LOW (ref 3.5–5.0)
Anion gap: 14 (ref 5–15)
BUN: 65 mg/dL — ABNORMAL HIGH (ref 6–20)
CO2: 24 mmol/L (ref 22–32)
Calcium: 8.3 mg/dL — ABNORMAL LOW (ref 8.9–10.3)
Chloride: 93 mmol/L — ABNORMAL LOW (ref 98–111)
Creatinine, Ser: 8.06 mg/dL — ABNORMAL HIGH (ref 0.44–1.00)
GFR, Estimated: 5 mL/min — ABNORMAL LOW (ref >60)
Glucose, Bld: 169 mg/dL — ABNORMAL HIGH (ref 70–99)
Phosphorus: 8 mg/dL — ABNORMAL HIGH (ref 2.5–4.6)
Potassium: 4.2 mmol/L (ref 3.5–5.1)
Sodium: 131 mmol/L — ABNORMAL LOW (ref 135–145)

## 2023-07-14 LAB — CBC
HCT: 35.4 % — ABNORMAL LOW (ref 36.0–46.0)
Hemoglobin: 11.5 g/dL — ABNORMAL LOW (ref 12.0–15.0)
MCH: 36.5 pg — ABNORMAL HIGH (ref 26.0–34.0)
MCHC: 32.5 g/dL (ref 30.0–36.0)
MCV: 112.4 fL — ABNORMAL HIGH (ref 80.0–100.0)
Platelets: 205 10*3/uL (ref 150–400)
RBC: 3.15 MIL/uL — ABNORMAL LOW (ref 3.87–5.11)
RDW: 14.6 % (ref 11.5–15.5)
WBC: 13.6 10*3/uL — ABNORMAL HIGH (ref 4.0–10.5)
nRBC: 0 % (ref 0.0–0.2)

## 2023-07-14 LAB — GLUCOSE, CAPILLARY: Glucose-Capillary: 141 mg/dL — ABNORMAL HIGH (ref 70–99)

## 2023-07-14 MED ORDER — CYANOCOBALAMIN 1000 MCG PO TABS
1000.0000 ug | ORAL_TABLET | Freq: Every day | ORAL | 0 refills | Status: AC
Start: 2023-07-14 — End: 2023-08-13

## 2023-07-14 MED ORDER — AZITHROMYCIN 250 MG PO TABS: 250.0000 mg | ORAL_TABLET | Freq: Every day | ORAL | 0 refills | Status: AC

## 2023-07-14 MED ORDER — ACETAMINOPHEN 325 MG PO TABS
ORAL_TABLET | ORAL | Status: AC
  Filled 2023-07-14: qty 2

## 2023-07-14 MED ORDER — ACETAMINOPHEN 325 MG PO TABS
650.0000 mg | ORAL_TABLET | Freq: Once | ORAL | Status: AC
  Administered 2023-07-14: 650 mg via ORAL

## 2023-07-14 NOTE — Discharge Summary (Signed)
 Physician Discharge Summary  CHESSICA AUDIA WNU:[id number] DOB: Apr 29, 1965 DOA: 07/10/2023  PCP: Eartha Gold, MD  Admit date: 07/10/2023 Discharge date: 07/14/2023  Admitted From: home  Disposition:  home   Recommendations for Outpatient Follow-up:  Follow up with PCP in 1-2 weeks F/u w/ nephro, Dr. Rhesa Celeste, in 1-2 weeks   Home Health:  Equipment/Devices: 2L [place]   Discharge Condition: stable  CODE STATUS:full Diet recommendation: Heart Healthy / Carb Modified  Brief/Interim Summary: HPI was taken from Dr. Vallarie Gauze: ETHYLENE REZNICK is a 58 y.o. female with medical history significant for History of breast cancer, COPD on nighttime Trelegy with O2 at 2 L bled in, ESRD on HD( TTS), HFpEF (EF 55-60 05/2021), hypothyroidism, diabetes mellitus, hypertension, permanent A-fib with prior cardioversion, on Eliquis , history of PE, and morbid obesity, BMI over 60 being admitted with principal diagnosis of pneumonia/possible aspiration and possible CHF with increased O2 requirement. .  She presented to the ED by EMS with a 3-day history of productive cough and dyspnea on exertion, which worsened following a vascular procedure(AV fistulogram) in which she was put to sleep.  She states she had a dry cough previously but following the procedure she developed a congested cough and had progressively increasing difficulty catching her breath with dyspnea on minimal exertion.  At baseline she sleeps in a recliner and cannot tolerate lying flat.  She denied fever or chills.  Denied chest pain, denied lower extremity pain or lower extremity swelling beyond her baseline.  Her last dialysis session was on the day of arrival and she had the full session.  EMS found her to have sats in the low 80s and she was placed on 4 L for transport. Of note, patient was last evaluated by her cardiologist in January 2025 when she was doing well and her CHF was well compensated ED course and data review: On arrival tachycardic to  the 120s tachypneic to the upper 20s requiring 4 L to maintain sats in the high 90s.  Initial BP 142/49. Labs notable for BNP 221, Basic metabolic panel in keeping with dialysis status and with normal potassium and bicarb.  CBC unremarkable with  anemia of 11.6, baseline 12-13   EKG, personally reviewed and interpreted showing A-fib at 133 with multiple PACs   Chest x-ray showing left lower lung zone airspace opacity. May represent a combination of pulmonary disease and pleural effusion recommending AP and lateral chest   Started on Rocephin  and azithromycin  for pneumonia   Hospitalist consulted for admission.     Discharge Diagnoses:  Principal Problem:   CAP/Possible aspiration (community acquired pneumonia) Active Problems:   Acute on chronic heart failure with preserved ejection fraction (HFpEF) (HCC)   Acute respiratory failure with hypoxia and hypercapnia (HCC)   Restrictive lung disease   COPD (chronic obstructive pulmonary disease) (HCC)   Atrial fibrillation with rapid ventricular response (HCC)   Chronic anticoagulation   History of pulmonary embolus (PE)   ESRD on hemodialysis (HCC)   History of breast cancer   Essential hypertension   Morbid obesity with BMI of 60.0-69.9, adult (HCC)   Hypothyroidism   Diabetes mellitus, type II (HCC)   (HFpEF) heart failure with preserved ejection fraction (HCC) CAP: continue on IV rocephin , azithromycin , bronchodilators & encourage incentive spirometry. Tussionex prn. D/c home w/ azithromycin  to complete the course   Acute on chronic hypoxic respiratory failure: only uses oxygen  at night as per pt. Continue on supplemental oxygen  and wean as tolerated. Unable to wean  from supplemental oxygen  so pt was d/c home w/ 2L [place]    Acute on chronic diastolic CHF: fluid/volume management w/ HD. Continue on metoprolol      Likely PAF: w/ RVR. Continue on eliquis , metoprolol     Hx of pulmonary embolus: continue on eliquis     ESRD: on HD TTS.  Nephro following and recs apprec     DM2: poorly controlled, HbA1c 8.3. Continue on SSI w/ accuchecks    Macrocytic anemia: folate is WNL. B12 deficiency, continue on po B12 supplement    Hypothyroidism: continue on home dose of levothyroxine     Morbid obesity: BMI 41.8. Complicates overall care & prognosis    HTN: continue on metoprolol , clonidine     Hx of breast cancer: management per onco outpatient    Discharge Instructions  Discharge Instructions     Amb referral to [facility]   Complete by: As directed    Diet - low sodium heart healthy   Complete by: As directed    Diet Carb Modified   Complete by: As directed    Discharge instructions   Complete by: As directed    F/u w/ PCP in 1-2 weeks. F/u w/ nephro, Dr. Rhesa Celeste, in 1-2 weeks.   Increase activity slowly   Complete by: As directed       Allergies as of 07/14/2023       Reactions   Spironolactone     Hyperkalemia Pt unaware of this allergy        Medication List     TAKE these medications    acetaminophen  325 MG tablet Commonly known as: TYLENOL  Take 2 tablets (650 mg total) by mouth every 6 (six) hours as needed for mild pain (or Fever >/= 101).   albuterol  108 (90 Base) MCG/ACT inhaler Commonly known as: VENTOLIN  HFA Inhale 2 puffs into the lungs every 6 (six) hours as needed for wheezing or shortness of breath.   apixaban  5 MG Tabs tablet Commonly known as: Eliquis  Take 1 tablet (5 mg total) by mouth 2 (two) times daily.   aspirin  EC 81 MG tablet Take 1 tablet (81 mg total) by mouth daily. Swallow whole.   atorvastatin  40 MG tablet Commonly known as: LIPITOR Take 40 mg by mouth daily.   azithromycin  250 MG tablet Commonly known as: ZITHROMAX  Take 1 tablet (250 mg total) by mouth daily for 2 days.   cinacalcet  30 MG tablet Commonly known as: SENSIPAR  Take 30 mg by mouth daily.   cloNIDine  0.1 MG tablet Commonly known as: CATAPRES  Take 1 tablet (0.1 mg total) by mouth 2 (two) times  daily. What changed:  when to take this additional instructions   CRANBERRY PO Take 1 capsule by mouth daily.   cyanocobalamin  1000 MCG tablet Take 1 tablet (1,000 mcg total) by mouth daily.   ezetimibe 10 MG tablet Commonly known as: ZETIA Take 10 mg by mouth daily with supper.   ferric gluconate 250 mg in sodium chloride  0.9 % 250 mL Inject 250 mg into the vein Every Tuesday,Thursday,and Saturday with dialysis.   fluticasone -salmeterol 100-50 MCG/ACT Aepb Commonly known as: Advair Diskus Inhale 1 puff into the lungs 2 (two) times daily.   gabapentin  300 MG capsule Commonly known as: NEURONTIN  Take 1 capsule (300 mg total) by mouth at bedtime.   levothyroxine  112 MCG tablet Commonly known as: SYNTHROID  Take 1 tablet (112 mcg total) by mouth daily before breakfast.   melatonin 5 MG Tabs Take 2 tablets (10 mg total) by mouth at bedtime  as needed (sleep).   metoprolol  succinate 25 MG 24 hr tablet Commonly known as: TOPROL -XL Take 1 tablet (25 mg total) by mouth at bedtime. Take with or immediately following a meal.   midodrine  10 MG tablet Commonly known as: PROAMATINE  Take 1 tablet (10 mg total) by mouth Every Tuesday,Thursday,and Saturday with dialysis.   multivitamin Tabs tablet Take 1 tablet by mouth daily.   OneTouch Delica Plus Lancet30G Misc   OneTouch Ultra test strip Generic drug: glucose blood   polyethylene glycol powder 17 GM/SCOOP powder Commonly known as: GLYCOLAX /MIRALAX  Take 17 g by mouth as needed for mild constipation or moderate constipation.   Trulicity  1.5 MG/0.5ML Soaj Generic drug: Dulaglutide  Inject 1.5 mg into the skin every Monday.   Velphoro 500 MG chewable tablet Generic drug: sucroferric oxyhydroxide Chew 1,000 mg by mouth 3 (three) times daily.               Durable Medical Equipment  (From admission, onward)           Start     Ordered   07/13/23 1630  For home use only DME oxygen   Once       Question Answer  Comment  Length of Need 6 Months   Mode or (Route) Nasal cannula   Liters per Minute 2   Frequency Continuous (stationary and portable oxygen  unit needed)   Oxygen  conserving device Yes   Oxygen  delivery system Gas      07/13/23 1629            Allergies  Allergen Reactions   Spironolactone      Hyperkalemia  Pt unaware of this allergy    Consultations: Nephro    Procedures/Studies: ECHOCARDIOGRAM COMPLETE Result Date: 07/11/2023    ECHOCARDIOGRAM REPORT   Patient Name:   BLESSIN KANNO Date of Exam: 07/11/2023 Medical Rec #:  [id number]      Height:       5.1 in Accession #:    [id number]     Weight:       355.8 lb Date of Birth:  23-Aug-1965       BSA:          0.400 m Patient Age:    57 years       BP:           115/60 mmHg Patient Gender: F              HR:           87 bpm. Exam Location:  [facility] Procedure: 2D Echo, Cardiac Doppler, Color Doppler and Intracardiac            Opacification Agent (Both Spectral and Color Flow Doppler were            utilized during procedure). Indications:     CHF-acute diastolic  History:         Patient has prior history of Echocardiogram examinations, most                  recent 05/18/2021. CHF, COPD, Arrythmias:Atrial Fibrillation;                  Risk Factors:Hypertension, Diabetes, Dyslipidemia and Sleep                  Apnea. CKD stage 3.  Sonographer:     Aldon Ambrosia Referring Phys:  [id number] Lanetta Pion Diagnosing Phys: Antionette Kirks MD  Sonographer Comments: Image acquisition challenging due to patient body  habitus. IMPRESSIONS  1. Left ventricular ejection fraction, by estimation, is 55 to 60%. The left ventricle has normal function. Left ventricular endocardial border not optimally defined to evaluate regional wall motion. There is mild left ventricular hypertrophy. Left ventricular diastolic parameters are indeterminate.  2. Right ventricular systolic function is normal. The right ventricular size is normal. There is moderately elevated  pulmonary artery systolic pressure.  3. Left atrial size was mildly dilated.  4. The mitral valve is normal in structure. No evidence of mitral valve regurgitation. No evidence of mitral stenosis.  5. The aortic valve is normal in structure. Aortic valve regurgitation is not visualized. No aortic stenosis is present. FINDINGS  Left Ventricle: Left ventricular ejection fraction, by estimation, is 55 to 60%. The left ventricle has normal function. Left ventricular endocardial border not optimally defined to evaluate regional wall motion. The left ventricular internal cavity size was normal in size. There is mild left ventricular hypertrophy. Left ventricular diastolic parameters are indeterminate. Right Ventricle: The right ventricular size is normal. No increase in right ventricular wall thickness. Right ventricular systolic function is normal. There is moderately elevated pulmonary artery systolic pressure. The tricuspid regurgitant velocity is 3.36 m/s, and with an assumed right atrial pressure of 5 mmHg, the estimated right ventricular systolic pressure is 50.2 mmHg. Left Atrium: Left atrial size was mildly dilated. Right Atrium: Right atrial size was normal in size. Pericardium: There is no evidence of pericardial effusion. Mitral Valve: The mitral valve is normal in structure. No evidence of mitral valve regurgitation. No evidence of mitral valve stenosis. MV peak gradient, 5.3 mmHg. The mean mitral valve gradient is 3.0 mmHg. Tricuspid Valve: The tricuspid valve is normal in structure. Tricuspid valve regurgitation is mild . No evidence of tricuspid stenosis. Aortic Valve: The aortic valve is normal in structure. Aortic valve regurgitation is not visualized. No aortic stenosis is present. Aortic valve mean gradient measures 3.0 mmHg. Aortic valve peak gradient measures 7.3 mmHg. Aortic valve area, by VTI measures 3.28 cm. Pulmonic Valve: The pulmonic valve was normal in structure. Pulmonic valve regurgitation  is not visualized. No evidence of pulmonic stenosis. Aorta: The aortic root is normal in size and structure. Venous: The inferior vena cava was not well visualized. IAS/Shunts: No atrial level shunt detected by color flow Doppler.  LEFT VENTRICLE PLAX 2D LVIDd:         4.90 cm      Diastology LVIDs:         3.30 cm      LV e' medial:    8.81 cm/s LV PW:         1.30 cm      LV E/e' medial:  13.1 LV IVS:        0.90 cm      LV e' lateral:   12.40 cm/s LVOT diam:     2.20 cm      LV E/e' lateral: 9.3 LV SV:         65 LV SV Index:   162 LVOT Area:     3.80 cm  LV Volumes (MOD) LV vol d, MOD A4C: 403.0 ml LV vol s, MOD A4C: 87.8 ml LV SV MOD A4C:     403.0 ml RIGHT VENTRICLE RV Basal diam:  4.70 cm RV Mid diam:    3.20 cm RV S prime:     10.30 cm/s TAPSE (M-mode): 2.0 cm LEFT ATRIUM             Index  RIGHT ATRIUM           Index LA diam:        4.20 cm 10.49 cm/m   RA Area:     16.50 cm LA Vol (A2C):   59.0 ml 147.42 ml/m  RA Volume:   37.60 ml  93.95 ml/m LA Vol (A4C):   86.3 ml 215.63 ml/m LA Biplane Vol: 78.1 ml 195.14 ml/m  AORTIC VALVE                    PULMONIC VALVE AV Area (Vmax):    2.87 cm     PV Vmax:       0.95 m/s AV Area (Vmean):   2.96 cm     PV Peak grad:  3.6 mmHg AV Area (VTI):     3.28 cm AV Vmax:           135.00 cm/s AV Vmean:          84.000 cm/s AV VTI:            0.198 m AV Peak Grad:      7.3 mmHg AV Mean Grad:      3.0 mmHg LVOT Vmax:         102.00 cm/s LVOT Vmean:        65.400 cm/s LVOT VTI:          0.171 m LVOT/AV VTI ratio: 0.86  AORTA Ao Root diam: 2.80 cm Ao Asc diam:  3.00 cm MITRAL VALVE                TRICUSPID VALVE MV Area (PHT): 5.09 cm     TR Peak grad:   45.2 mmHg MV Area VTI:   5.12 cm     TR Vmax:        336.00 cm/s MV Peak grad:  5.3 mmHg MV Mean grad:  3.0 mmHg     SHUNTS MV Vmax:       1.15 m/s     Systemic VTI:  0.17 m MV Vmean:      88.5 cm/s    Systemic Diam: 2.20 cm MV Decel Time: 149 msec MV E velocity: 115.00 cm/s Antionette Kirks MD Electronically  signed by Antionette Kirks MD Signature Date/Time: 07/11/2023/3:19:58 PM    Final    DG Chest 1 View Result Date: 07/10/2023 CLINICAL DATA:  10026 Shortness of breath 10026 EXAM: CHEST  1 VIEW COMPARISON:  CT chest 02/09/2020, chest x-ray 01/28/2023 FINDINGS: Cardiomegaly. The heart and mediastinal contours are unchanged given AP portable technique. Atherosclerotic plaque. No focal consolidation. No pulmonary edema. Left lower lung zone airspace opacity. No pneumothorax. No acute osseous abnormality. IMPRESSION: Left lower lung zone airspace opacity. Finding may represent combination of pulmonary disease and pleural effusion. Recommend repeat chest x-ray PA and lateral view of the chest for further evaluation. Electronically Signed   By: Morgane  Naveau M.D.   On: 07/10/2023 20:50   PERIPHERAL VASCULAR CATHETERIZATION Result Date: 07/06/2023 See surgical note for result.  VAS US  DUPLEX DIALYSIS ACCESS (AVF, AVG) Result Date: 07/02/2023 DIALYSIS ACCESS Patient Name:  EMERSYNN DEATLEY  Date of Exam:   07/02/2023 Medical Rec #: [id number]       Accession #:    [id number] Date of Birth: August 12, 1965        Patient Gender: F Patient Age:   59 years Exam Location:  [facility] Procedure:      VAS US  DUPLEX DIALYSIS ACCESS (AVF, AVG) Referring  Phys: Sharla Davis --------------------------------------------------------------------------------  History: 07/22/2021: Radial Cephalic AVF created.           12/09/2021 : Lt Brachial Cephalic AVF created.           04/28/2022: Revision of Left Brachial Cephalic AVF.           06/27/2022: AV Fistulogram.           01/05/2023: PTA peripheral segment in 3 locations Left Brachial          Cephalic AVF. Comparison Study: 03/2023 Performing Technologist: Faustine Hoof RVT  Examination Guidelines: A complete evaluation includes B-mode imaging, spectral Doppler, color Doppler, and power Doppler as needed of all accessible portions of each vessel. Unilateral testing is considered  an integral part of a complete examination. Limited examinations for reoccurring indications may be performed as noted.  Findings: +--------------------+----------+-----------------+--------+ AVF                 PSV (cm/s)Flow Vol (mL/min)Comments +--------------------+----------+-----------------+--------+ Native artery inflow   306          1242                +--------------------+----------+-----------------+--------+ AVF Anastomosis        682                              +--------------------+----------+-----------------+--------+  +-------------+----------+-------------+----------+--------------------+ OUTFLOW VEIN PSV (cm/s)Diameter (cm)Depth (cm)      Describe       +-------------+----------+-------------+----------+--------------------+ Axillary vein    42                                                +-------------+----------+-------------+----------+--------------------+ Confluence       64                                                +-------------+----------+-------------+----------+--------------------+ Shoulder         88                                                +-------------+----------+-------------+----------+--------------------+ Prox UA         209                                  stent         +-------------+----------+-------------+----------+--------------------+ Mid UA          287        1.58               aneurysmal and stent +-------------+----------+-------------+----------+--------------------+ Dist UA         118        1.06                                    +-------------+----------+-------------+----------+--------------------+   Summary: 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 Improved flow volume compared to previous  study.  *See table(s) above for measurements and observations.  Diagnosing physician: [name] MD Electronically signed by [name] MD on 07/02/2023 at 4:57:33 PM.   --------------------------------------------------------------------------------   Final    (Echo, Carotid, EGD, Colonoscopy, ERCP)    Subjective: Pt c/o fatigue    Discharge Exam: Vitals:   07/14/23 1224 07/14/23 1327  BP: 108/74 (!) 104/47  Pulse: (!) 104 (!) 105  Resp: 15 18  Temp: 98 F (36.7 C) 97.7 F (36.5 C)  SpO2: 99% 100%   Vitals:   07/14/23 1218 07/14/23 1224 07/14/23 1251 07/14/23 1327  BP:  108/74  (!) 104/47  Pulse:  (!) 104  (!) 105  Resp: (!) 22 15  18   Temp:  98 F (36.7 C)  97.7 F (36.5 C)  TempSrc:  Oral  Oral  SpO2:  99%  100%  Weight:   (!) 160.9 kg   Height:        General: Pt is alert, awake, not in acute distress. Morbidly obese Cardiovascular:  S1/S2 +, no rubs, no gallops Respiratory: decreased breath sounds b/l Abdominal: Soft, NT, obese, bowel sounds + Extremities:  no cyanosis    The results of significant diagnostics from this hospitalization (including imaging, microbiology, ancillary and laboratory) are listed below for reference.     Microbiology: Recent Results (from the past 240 hours)  Resp panel by RT-PCR (RSV, Flu A&B, Covid) Anterior Nasal Swab     Status: None   Collection Time: 07/10/23  8:33 PM   Specimen: Anterior Nasal Swab  Result Value Ref Range Status   SARS Coronavirus 2 by RT PCR NEGATIVE NEGATIVE Final    Comment: (NOTE) SARS-CoV-2 target nucleic acids are NOT DETECTED.  The SARS-CoV-2 RNA is generally detectable in upper respiratory specimens during the acute phase of infection. The lowest concentration of SARS-CoV-2 viral copies this assay can detect is 138 copies/mL. A negative result does not preclude SARS-Cov-2 infection and should not be used as the sole basis for treatment or other patient management decisions. A negative result may occur with  improper specimen collection/handling, submission of specimen other than nasopharyngeal swab, presence of viral  mutation(s) within the areas targeted by this assay, and inadequate number of viral copies(<138 copies/mL). A negative result must be combined with clinical observations, patient history, and epidemiological information. The expected result is Negative.  Fact Sheet for Patients:  BloggerCourse.com  Fact Sheet for Healthcare Providers:  SeriousBroker.it  This test is no t yet approved or cleared by the [place]  FDA and  has been authorized for detection and/or diagnosis of SARS-CoV-2 by FDA under an Emergency Use Authorization (EUA). This EUA will remain  in effect (meaning this test can be used) for the duration of the COVID-19 declaration under Section 564(b)(1) of the Act, 21 U.S.C.section 360bbb-3(b)(1), unless the authorization is terminated  or revoked sooner.       Influenza A by PCR NEGATIVE NEGATIVE Final   Influenza B by PCR NEGATIVE NEGATIVE Final    Comment: (NOTE) The Xpert Xpress SARS-CoV-2/FLU/RSV plus assay is intended as an aid in the diagnosis of influenza from Nasopharyngeal swab specimens and should not be used as a sole basis for treatment. Nasal washings and aspirates are unacceptable for Xpert Xpress SARS-CoV-2/FLU/RSV testing.  Fact Sheet for Patients: BloggerCourse.com  Fact Sheet for Healthcare Providers: SeriousBroker.it  This test is not yet approved or cleared by the [place]  FDA and has been authorized for detection and/or diagnosis of SARS-CoV-2 by FDA  under an Emergency Use Authorization (EUA). This EUA will remain in effect (meaning this test can be used) for the duration of the COVID-19 declaration under Section 564(b)(1) of the Act, 21 U.S.C. section 360bbb-3(b)(1), unless the authorization is terminated or revoked.     Resp Syncytial Virus by PCR NEGATIVE NEGATIVE Final    Comment: (NOTE) Fact Sheet for  Patients: BloggerCourse.com  Fact Sheet for Healthcare Providers: SeriousBroker.it  This test is not yet approved or cleared by the [place]  FDA and has been authorized for detection and/or diagnosis of SARS-CoV-2 by FDA under an Emergency Use Authorization (EUA). This EUA will remain in effect (meaning this test can be used) for the duration of the COVID-19 declaration under Section 564(b)(1) of the Act, 21 U.S.C. section 360bbb-3(b)(1), unless the authorization is terminated or revoked.  Performed at [facility], [address] [place], [place] [address]      Labs: BNP (last 3 results) Recent Labs    07/10/23 2032  BNP 221.3*   Basic Metabolic Panel: Recent Labs  Lab 07/10/23 2025 07/11/23 0258 07/12/23 0458 07/14/23 0845  NA 136 137 135 131*  K 4.1 4.1 4.4 4.2  CL 94* 95* 93* 93*  CO2 30 28 27 24   GLUCOSE 189* 192* 194* 169*  BUN 34* 39* 62* 65*  CREATININE 5.54* 6.18* 7.94* 8.06*  CALCIUM  8.7* 8.1* 8.5* 8.3*  PHOS  --   --   --  8.0*   Liver Function Tests: Recent Labs  Lab 07/14/23 0845  ALBUMIN  3.0*   No results for input(s): "LIPASE", "AMYLASE" in the last 168 hours. No results for input(s): "AMMONIA" in the last 168 hours. CBC: Recent Labs  Lab 07/10/23 2025 07/11/23 0258 07/12/23 0458 07/13/23 0619 07/14/23 0425  WBC 10.2 9.4 12.1* 11.2* 13.6*  HGB 11.6* 11.2* 11.2* 11.6* 11.5*  HCT 36.6 34.9* 34.8* 36.9 35.4*  MCV 114.4* 114.8* 112.6* 113.5* 112.4*  PLT 177 166 171 173 205   Cardiac Enzymes: No results for input(s): "CKTOTAL", "CKMB", "CKMBINDEX", "TROPONINI" in the last 168 hours. BNP: Invalid input(s): "POCBNP" CBG: Recent Labs  Lab 07/12/23 2043 07/13/23 0826 07/13/23 1151 07/13/23 1736 07/13/23 2037  GLUCAP 204* 156* 155* 150* 167*   D-Dimer No results for input(s): "DDIMER" in the last 72 hours. Hgb A1c No results for input(s): "HGBA1C" in the last 72  hours. Lipid Profile No results for input(s): "CHOL", "HDL", "LDLCALC", "TRIG", "CHOLHDL", "LDLDIRECT" in the last 72 hours. Thyroid  function studies No results for input(s): "TSH", "T4TOTAL", "T3FREE", "THYROIDAB" in the last 72 hours.  Invalid input(s): "FREET3" Anemia work up Recent Labs    07/12/23 0458  VITAMINB12 443  FOLATE 35.0   Urinalysis    Component Value Date/Time   COLORURINE YELLOW 04/06/2021 1825   APPEARANCEUR Cloudy (A) 06/08/2021 1401   LABSPEC 1.025 04/06/2021 1825   PHURINE 5.5 04/06/2021 1825   GLUCOSEU Negative 06/08/2021 1401   HGBUR LARGE (A) 04/06/2021 1825   BILIRUBINUR Negative 06/08/2021 1401   KETONESUR NEGATIVE 04/06/2021 1825   PROTEINUR 2+ (A) 06/08/2021 1401   PROTEINUR 100 (A) 04/06/2021 1825   NITRITE Negative 06/08/2021 1401   NITRITE NEGATIVE 04/06/2021 1825   LEUKOCYTESUR 2+ (A) 06/08/2021 1401   LEUKOCYTESUR MODERATE (A) 04/06/2021 1825   Sepsis Labs Recent Labs  Lab 07/11/23 0258 07/12/23 0458 07/13/23 0619 07/14/23 0425  WBC 9.4 12.1* 11.2* 13.6*   Microbiology Recent Results (from the past 240 hours)  Resp panel by RT-PCR (RSV, Flu A&B, Covid) Anterior  Nasal Swab     Status: None   Collection Time: 07/10/23  8:33 PM   Specimen: Anterior Nasal Swab  Result Value Ref Range Status   SARS Coronavirus 2 by RT PCR NEGATIVE NEGATIVE Final    Comment: (NOTE) SARS-CoV-2 target nucleic acids are NOT DETECTED.  The SARS-CoV-2 RNA is generally detectable in upper respiratory specimens during the acute phase of infection. The lowest concentration of SARS-CoV-2 viral copies this assay can detect is 138 copies/mL. A negative result does not preclude SARS-Cov-2 infection and should not be used as the sole basis for treatment or other patient management decisions. A negative result may occur with  improper specimen collection/handling, submission of specimen other than nasopharyngeal swab, presence of viral mutation(s) within  the areas targeted by this assay, and inadequate number of viral copies(<138 copies/mL). A negative result must be combined with clinical observations, patient history, and epidemiological information. The expected result is Negative.  Fact Sheet for Patients:  BloggerCourse.com  Fact Sheet for Healthcare Providers:  SeriousBroker.it  This test is no t yet approved or cleared by the [place]  FDA and  has been authorized for detection and/or diagnosis of SARS-CoV-2 by FDA under an Emergency Use Authorization (EUA). This EUA will remain  in effect (meaning this test can be used) for the duration of the COVID-19 declaration under Section 564(b)(1) of the Act, 21 U.S.C.section 360bbb-3(b)(1), unless the authorization is terminated  or revoked sooner.       Influenza A by PCR NEGATIVE NEGATIVE Final   Influenza B by PCR NEGATIVE NEGATIVE Final    Comment: (NOTE) The Xpert Xpress SARS-CoV-2/FLU/RSV plus assay is intended as an aid in the diagnosis of influenza from Nasopharyngeal swab specimens and should not be used as a sole basis for treatment. Nasal washings and aspirates are unacceptable for Xpert Xpress SARS-CoV-2/FLU/RSV testing.  Fact Sheet for Patients: BloggerCourse.com  Fact Sheet for Healthcare Providers: SeriousBroker.it  This test is not yet approved or cleared by the [place]  FDA and has been authorized for detection and/or diagnosis of SARS-CoV-2 by FDA under an Emergency Use Authorization (EUA). This EUA will remain in effect (meaning this test can be used) for the duration of the COVID-19 declaration under Section 564(b)(1) of the Act, 21 U.S.C. section 360bbb-3(b)(1), unless the authorization is terminated or revoked.     Resp Syncytial Virus by PCR NEGATIVE NEGATIVE Final    Comment: (NOTE) Fact Sheet for  Patients: BloggerCourse.com  Fact Sheet for Healthcare Providers: SeriousBroker.it  This test is not yet approved or cleared by the [place]  FDA and has been authorized for detection and/or diagnosis of SARS-CoV-2 by FDA under an Emergency Use Authorization (EUA). This EUA will remain in effect (meaning this test can be used) for the duration of the COVID-19 declaration under Section 564(b)(1) of the Act, 21 U.S.C. section 360bbb-3(b)(1), unless the authorization is terminated or revoked.  Performed at [facility], [address] [place], [place] [address]      Time coordinating discharge: Over 30 minutes  SIGNED:   Alphonsus Jeans, MD  [facility] 07/14/2023, 1:29 PM Pager   If 7PM-7AM, please contact night-coverage [email or web address]

## 2023-07-14 NOTE — TOC Transition Note (Signed)
 Transition of Care [facility]) - Discharge Note   Patient Details  Name: Nicole Schmidt MRN: [id number] Date of Birth: September 29, 1965  Transition [facility]) CM/SW Contact:  Alexandra Ice, RN Phone Number: 07/14/2023, 1:45 PM   Clinical Narrative:     Patient discharge today, to home. Patient is already set up with [employer] for oxygen ,they will deliver O2 tank to bedside for discharge. No other TOC needs identified.  Final next level of care: Home/Self Care Barriers to Discharge: Barriers Resolved   Patient Goals and CMS Choice            Discharge Placement                  Name of family member notified: Family Patient and family notified of of transfer: 07/14/23  Discharge Plan and Services Additional resources added to the After Visit Summary for                    DME Agency: NA       HH Arranged: NA          Social Drivers of Health (SDOH) Interventions SDOH Screenings   Food Insecurity: Patient Declined (07/10/2023)  Housing: Unknown (07/10/2023)  Transportation Needs: Patient Declined (07/10/2023)  Utilities: At Risk (07/10/2023)  Depression (PHQ2-9): Low Risk  (01/26/2021)  Financial Resource Strain: Low Risk  (12/22/2022)   Received from [facility]  Physical Activity: Unknown (11/15/2017)  Social Connections: Patient Declined (07/10/2023)  Stress: Unknown (11/15/2017)  Tobacco Use: Low Risk  (07/10/2023)     Readmission Risk Interventions     No data to display

## 2023-07-14 NOTE — Plan of Care (Signed)
  Problem: Education: Goal: Ability to describe self-care measures that may prevent or decrease complications (Diabetes Survival Skills Education) will improve Outcome: Adequate for Discharge Goal: Individualized Educational Video(s) Outcome: Adequate for Discharge   Problem: Coping: Goal: Ability to adjust to condition or change in health will improve Outcome: Adequate for Discharge   Problem: Fluid Volume: Goal: Ability to maintain a balanced intake and output will improve Outcome: Adequate for Discharge   Problem: Health Behavior/Discharge Planning: Goal: Ability to identify and utilize available resources and services will improve Outcome: Adequate for Discharge Goal: Ability to manage health-related needs will improve Outcome: Adequate for Discharge   Problem: Metabolic: Goal: Ability to maintain appropriate glucose levels will improve Outcome: Adequate for Discharge   Problem: Nutritional: Goal: Maintenance of adequate nutrition will improve Outcome: Adequate for Discharge Goal: Progress toward achieving an optimal weight will improve Outcome: Adequate for Discharge   Problem: Skin Integrity: Goal: Risk for impaired skin integrity will decrease Outcome: Adequate for Discharge   Problem: Tissue Perfusion: Goal: Adequacy of tissue perfusion will improve Outcome: Adequate for Discharge   Problem: Education: Goal: Knowledge of General Education information will improve Description: Including pain rating scale, medication(s)/side effects and non-pharmacologic comfort measures Outcome: Adequate for Discharge   Problem: Health Behavior/Discharge Planning: Goal: Ability to manage health-related needs will improve Outcome: Adequate for Discharge   Problem: Clinical Measurements: Goal: Ability to maintain clinical measurements within normal limits will improve Outcome: Adequate for Discharge Goal: Will remain free from infection Outcome: Adequate for Discharge Goal:  Diagnostic test results will improve Outcome: Adequate for Discharge Goal: Respiratory complications will improve Outcome: Adequate for Discharge Goal: Cardiovascular complication will be avoided Outcome: Adequate for Discharge   Problem: Activity: Goal: Risk for activity intolerance will decrease Outcome: Adequate for Discharge   Problem: Nutrition: Goal: Adequate nutrition will be maintained Outcome: Adequate for Discharge   Problem: Coping: Goal: Level of anxiety will decrease Outcome: Adequate for Discharge   Problem: Elimination: Goal: Will not experience complications related to bowel motility Outcome: Adequate for Discharge Goal: Will not experience complications related to urinary retention Outcome: Adequate for Discharge   Problem: Pain Managment: Goal: General experience of comfort will improve and/or be controlled Outcome: Adequate for Discharge   Problem: Safety: Goal: Ability to remain free from injury will improve Outcome: Adequate for Discharge   Problem: Skin Integrity: Goal: Risk for impaired skin integrity will decrease Outcome: Adequate for Discharge   Problem: Education: Goal: Knowledge of disease or condition will improve Outcome: Adequate for Discharge Goal: Understanding of medication regimen will improve Outcome: Adequate for Discharge Goal: Individualized Educational Video(s) Outcome: Adequate for Discharge   Problem: Activity: Goal: Ability to tolerate increased activity will improve Outcome: Adequate for Discharge   Problem: Cardiac: Goal: Ability to achieve and maintain adequate cardiopulmonary perfusion will improve Outcome: Adequate for Discharge   Problem: Health Behavior/Discharge Planning: Goal: Ability to safely manage health-related needs after discharge will improve Outcome: Adequate for Discharge

## 2023-07-14 NOTE — Progress Notes (Signed)
  Received patient in bed to unit.   Informed consent signed and in chart.    TX duration:3.50     Transported by  Hand-off given to patient's nurse. Pt tolerated tx well. No concerns.   Access used: Graft Access issues: none   Total UF removed: 2.8 kg Medication(s) given: Tylenol  Post HD VS: wnl Post HD weight: 106.9 kg     N. Aleyssa Pike LPN [facility]

## 2023-07-14 NOTE — TOC Progression Note (Signed)
 Transition of Care [facility]) - Progression Note    Patient Details  Name: Nicole Schmidt MRN: [id number] Date of Birth: 04-Jul-1965  Transition of Care [facility]) CM/SW Contact  Alexandra Ice, RN Phone Number: 07/14/2023, 11:25 AM  Clinical Narrative:     Received message from [employer] [facility] [employer] stated patient is already set up with home oxygen  through them and will deliver tank to room.        Expected Discharge Plan and Services                                               Social Determinants of Health (SDOH) Interventions SDOH Screenings   Food Insecurity: Patient Declined (07/10/2023)  Housing: Unknown (07/10/2023)  Transportation Needs: Patient Declined (07/10/2023)  Utilities: At Risk (07/10/2023)  Depression (PHQ2-9): Low Risk  (01/26/2021)  Financial Resource Strain: Low Risk  (12/22/2022)   Received from [facility]  Physical Activity: Unknown (11/15/2017)  Social Connections: Patient Declined (07/10/2023)  Stress: Unknown (11/15/2017)  Tobacco Use: Low Risk  (07/10/2023)    Readmission Risk Interventions     No data to display

## 2023-07-14 NOTE — Progress Notes (Signed)
 [facility]  ROUNDING NOTE   Subjective:   Nicole Schmidt  is a 58 year old female with past medical histories including hypertension, diabetes, hypothyroidism, atrial fibrillation, morbid obesity, and end-stage renal disease on hemodialysis. She presents to Ed with cough and weakness. She has been admitted for Pleural effusion [J90] Atrial fibrillation with rapid ventricular response (HCC) [I48.91] Community acquired pneumonia of left lower lobe of lung [J18.9] CAP (community acquired pneumonia) [J18.9]   Patient is known to our clinic and receives outpatient dialysis treatments at [facility] on a TTS schedule, overseen by Dr. Rhesa Celeste.   Patient seen and evaluated during dialysis   HEMODIALYSIS FLOWSHEET:  Blood Flow Rate (mL/min): 0 mL/min Arterial Pressure (mmHg): -49.89 mmHg Venous Pressure (mmHg): 56.76 mmHg TMP (mmHg): 10.91 mmHg Ultrafiltration Rate (mL/min): 1049 mL/min Dialysate Flow Rate (mL/min): 300 ml/min Dialysis Fluid Bolus: Normal Saline Bolus Amount (mL): 100 mL  Tolerating treatment seated in chair No complaints Remains on 2 L nasal cannula, baseline oxygen  requirement at night only.  Objective:  Vital signs in last 24 hours:  Temp:  [97.6 F (36.4 C)-98.9 F (37.2 C)] 98 F (36.7 C) (05/31 1224) Pulse Rate:  [72-119] 104 (05/31 1224) Resp:  [13-28] 15 (05/31 1224) BP: (108-148)/(53-84) 108/74 (05/31 1224) SpO2:  [82 %-100 %] 99 % (05/31 1224) Weight:  [160.9 kg-163.7 kg] 160.9 kg (05/31 1251)  Weight change:  Filed Weights   07/14/23 0721 07/14/23 0833 07/14/23 1251  Weight: (!) 163.7 kg (!) 163.5 kg (!) 160.9 kg    Intake/Output: I/O last 3 completed shifts: In: 883 [P.O.:680; I.V.:3; IV Piggyback:200] Out: -    Intake/Output this shift:  Total I/O In: 100 [IV Piggyback:100] Out: 2800 [Other:2800]  Physical Exam: General: NAD, sitting in chair  Head: Normocephalic, atraumatic. Moist oral mucosal membranes  Eyes:  Anicteric  Lungs:  Diminished, [place] O2  Heart: Regular rate and rhythm  Abdomen:  Soft, nontender, obese  Extremities:  2+ peripheral edema.  Neurologic: Alert, moving all four extremities  Skin: No lesions  Access: Lt AVF    Basic Metabolic Panel: Recent Labs  Lab 07/10/23 2025 07/11/23 0258 07/12/23 0458 07/14/23 0845  NA 136 137 135 131*  K 4.1 4.1 4.4 4.2  CL 94* 95* 93* 93*  CO2 30 28 27 24   GLUCOSE 189* 192* 194* 169*  BUN 34* 39* 62* 65*  CREATININE 5.54* 6.18* 7.94* 8.06*  CALCIUM  8.7* 8.1* 8.5* 8.3*  PHOS  --   --   --  8.0*    Liver Function Tests: Recent Labs  Lab 07/14/23 0845  ALBUMIN  3.0*   No results for input(s): "LIPASE", "AMYLASE" in the last 168 hours. No results for input(s): "AMMONIA" in the last 168 hours.  CBC: Recent Labs  Lab 07/10/23 2025 07/11/23 0258 07/12/23 0458 07/13/23 0619 07/14/23 0425  WBC 10.2 9.4 12.1* 11.2* 13.6*  HGB 11.6* 11.2* 11.2* 11.6* 11.5*  HCT 36.6 34.9* 34.8* 36.9 35.4*  MCV 114.4* 114.8* 112.6* 113.5* 112.4*  PLT 177 166 171 173 205    Cardiac Enzymes: No results for input(s): "CKTOTAL", "CKMB", "CKMBINDEX", "TROPONINI" in the last 168 hours.  BNP: Invalid input(s): "POCBNP"  CBG: Recent Labs  Lab 07/12/23 2043 07/13/23 0826 07/13/23 1151 07/13/23 1736 07/13/23 2037  GLUCAP 204* 156* 155* 150* 167*    Microbiology: Results for orders placed or performed during the hospital encounter of 07/10/23  Resp panel by RT-PCR (RSV, Flu A&B, Covid) Anterior Nasal Swab     Status: None  Collection Time: 07/10/23  8:33 PM   Specimen: Anterior Nasal Swab  Result Value Ref Range Status   SARS Coronavirus 2 by RT PCR NEGATIVE NEGATIVE Final    Comment: (NOTE) SARS-CoV-2 target nucleic acids are NOT DETECTED.  The SARS-CoV-2 RNA is generally detectable in upper respiratory specimens during the acute phase of infection. The lowest concentration of SARS-CoV-2 viral copies this assay can detect is 138  copies/mL. A negative result does not preclude SARS-Cov-2 infection and should not be used as the sole basis for treatment or other patient management decisions. A negative result may occur with  improper specimen collection/handling, submission of specimen other than nasopharyngeal swab, presence of viral mutation(s) within the areas targeted by this assay, and inadequate number of viral copies(<138 copies/mL). A negative result must be combined with clinical observations, patient history, and epidemiological information. The expected result is Negative.  Fact Sheet for Patients:  BloggerCourse.com  Fact Sheet for Healthcare Providers:  SeriousBroker.it  This test is no t yet approved or cleared by the [place]  FDA and  has been authorized for detection and/or diagnosis of SARS-CoV-2 by FDA under an Emergency Use Authorization (EUA). This EUA will remain  in effect (meaning this test can be used) for the duration of the COVID-19 declaration under Section 564(b)(1) of the Act, 21 U.S.C.section 360bbb-3(b)(1), unless the authorization is terminated  or revoked sooner.       Influenza A by PCR NEGATIVE NEGATIVE Final   Influenza B by PCR NEGATIVE NEGATIVE Final    Comment: (NOTE) The Xpert Xpress SARS-CoV-2/FLU/RSV plus assay is intended as an aid in the diagnosis of influenza from Nasopharyngeal swab specimens and should not be used as a sole basis for treatment. Nasal washings and aspirates are unacceptable for Xpert Xpress SARS-CoV-2/FLU/RSV testing.  Fact Sheet for Patients: BloggerCourse.com  Fact Sheet for Healthcare Providers: SeriousBroker.it  This test is not yet approved or cleared by the [place]  FDA and has been authorized for detection and/or diagnosis of SARS-CoV-2 by FDA under an Emergency Use Authorization (EUA). This EUA will remain in effect (meaning  this test can be used) for the duration of the COVID-19 declaration under Section 564(b)(1) of the Act, 21 U.S.C. section 360bbb-3(b)(1), unless the authorization is terminated or revoked.     Resp Syncytial Virus by PCR NEGATIVE NEGATIVE Final    Comment: (NOTE) Fact Sheet for Patients: BloggerCourse.com  Fact Sheet for Healthcare Providers: SeriousBroker.it  This test is not yet approved or cleared by the [place]  FDA and has been authorized for detection and/or diagnosis of SARS-CoV-2 by FDA under an Emergency Use Authorization (EUA). This EUA will remain in effect (meaning this test can be used) for the duration of the COVID-19 declaration under Section 564(b)(1) of the Act, 21 U.S.C. section 360bbb-3(b)(1), unless the authorization is terminated or revoked.  Performed at [facility], [address] [place], [place] [address]     Coagulation Studies: No results for input(s): "LABPROT", "INR" in the last 72 hours.  Urinalysis: No results for input(s): "COLORURINE", "LABSPEC", "PHURINE", "GLUCOSEU", "HGBUR", "BILIRUBINUR", "KETONESUR", "PROTEINUR", "UROBILINOGEN", "NITRITE", "LEUKOCYTESUR" in the last 72 hours.  Invalid input(s): "APPERANCEUR"    Imaging: No results found.    Medications:    cefTRIAXone  (ROCEPHIN )  IV Stopped (07/13/23 2314)    apixaban   5 mg Oral BID   aspirin  EC  81 mg Oral Daily   atorvastatin   40 mg Oral Daily   azithromycin   500 mg Oral Daily   Chlorhexidine   Gluconate Cloth  6 each Topical Q0600   cinacalcet   30 mg Oral Q breakfast   vitamin B-12  1,000 mcg Oral Daily   fluticasone  furoate-vilanterol  1 puff Inhalation Daily   furosemide   40 mg Intravenous Once   gabapentin   300 mg Oral QHS   insulin  aspart  0-20 Units Subcutaneous TID WC   insulin  aspart  0-5 Units Subcutaneous QHS   levothyroxine   112 mcg Oral Q0600   metoprolol  succinate  25 mg Oral QHS   sodium  chloride flush  3 mL Intravenous Q12H   atropine , butalbital -acetaminophen -caffeine , guaiFENesin -dextromethorphan , heparin , hydrALAZINE , lidocaine -prilocaine , ondansetron  (ZOFRAN ) IV, mouth rinse, pentafluoroprop-tetrafluoroeth, sodium chloride  flush  Assessment/ Plan:  [name] is a 58 y.o.  female  with past medical histories including hypertension, diabetes, hypothyroidism, atrial fibrillation, morbid obesity, and end-stage renal disease on hemodialysis. She presents with cough and weakness and has been admitted for Pleural effusion [J90] Atrial fibrillation with rapid ventricular response (HCC) [I48.91] Community acquired pneumonia of left lower lobe of lung [J18.9] CAP (community acquired pneumonia) [J18.9]  [facility] [place]/TTS/ Lt AVF  End stage renal disease on hemodialysis.  Patient received dialysis treatment today, UF 2.5 L as tolerated.  Next treatment scheduled for Tuesday.  If stable, patient cleared to discharge after dialysis.  Will defer to primary team.  2. Anemia of chronic kidney disease Lab Results  Component Value Date   HGB 11.5 (L) 07/14/2023    Patient receives Mircera at outpatient clinic.  Hemoglobin remains within optimal range.  3. Secondary Hyperparathyroidism: with outpatient labs: none Lab Results  Component Value Date   PTH 94 (H) 11/18/2017   CALCIUM  8.3 (L) 07/14/2023   CAION 1.02 (L) 04/28/2022   PHOS 8.0 (H) 07/14/2023    Calcium  acceptable however hyperphosphatemia noted.  Continue Cinacalcet  and will consider binders as outpatient.  4. Hypertension with chronic kidney disease. Home regimen includes clonidine  and metoprolol . Midodrine  ordered with dialysis.  Blood pressure 127/61 during dialysis.   LOS: 3 Gal Smolinski 5/31/202512:54 PM

## 2023-07-14 NOTE — TOC Progression Note (Signed)
 Transition of Care [facility]) - Progression Note    Patient Details  Name: Nicole Schmidt MRN: [id number] Date of Birth: 1965-03-26  [facility]) CM/SW Contact  Alexandra Ice, RN Phone Number: 07/14/2023, 8:47 AM  Clinical Narrative:     Received message from MD patient is needing home oxygen . Sent order to [employer] for processing, notified [employer] that patient to discharge after HD today.        Expected Discharge Plan and Services                                               Social Determinants of Health (SDOH) Interventions SDOH Screenings   Food Insecurity: Patient Declined (07/10/2023)  Housing: Unknown (07/10/2023)  Transportation Needs: Patient Declined (07/10/2023)  Utilities: At Risk (07/10/2023)  Depression (PHQ2-9): Low Risk  (01/26/2021)  Financial Resource Strain: Low Risk  (12/22/2022)   Received from [facility]  Physical Activity: Unknown (11/15/2017)  Social Connections: Patient Declined (07/10/2023)  Stress: Unknown (11/15/2017)  Tobacco Use: Low Risk  (07/10/2023)    Readmission Risk Interventions     No data to display

## 2023-07-14 NOTE — Progress Notes (Signed)
 AVS reviewed and given to patient. PIV removed. Patient sent home with  home oxygen 

## 2023-07-20 DIAGNOSIS — E662 Morbid (severe) obesity with alveolar hypoventilation: Secondary | ICD-10-CM | POA: Diagnosis not present

## 2023-07-25 DIAGNOSIS — J189 Pneumonia, unspecified organism: Secondary | ICD-10-CM | POA: Diagnosis not present

## 2023-07-25 DIAGNOSIS — E118 Type 2 diabetes mellitus with unspecified complications: Secondary | ICD-10-CM | POA: Diagnosis not present

## 2023-07-25 DIAGNOSIS — R0902 Hypoxemia: Secondary | ICD-10-CM | POA: Diagnosis not present

## 2023-07-25 DIAGNOSIS — N186 End stage renal disease: Secondary | ICD-10-CM | POA: Diagnosis not present

## 2023-07-27 NOTE — Anesthesia Postprocedure Evaluation (Signed)
 Anesthesia Post Note  Patient: Nicole Schmidt  Procedure(s) Performed: A/V Fistulagram (Left)  Patient location during evaluation: PACU Anesthesia Type: General Level of consciousness: awake Pain management: pain level controlled Vital Signs Assessment: post-procedure vital signs reviewed and stable Respiratory status: spontaneous breathing Cardiovascular status: stable Anesthetic complications: no   There were no known notable events for this encounter.   Last Vitals:  Vitals:   07/06/23 1730 07/06/23 1754  BP: (!) 142/74   Pulse: (!) 130   Resp: (!) 31   Temp: 36.9 C   SpO2: (!) 88% (!) 89%    Last Pain:  Vitals:   07/06/23 1730  TempSrc: Oral  PainSc: 0-No pain                 VAN STAVEREN,Charee Tumblin

## 2023-07-30 DIAGNOSIS — L851 Acquired keratosis [keratoderma] palmaris et plantaris: Secondary | ICD-10-CM | POA: Diagnosis not present

## 2023-07-30 DIAGNOSIS — E1140 Type 2 diabetes mellitus with diabetic neuropathy, unspecified: Secondary | ICD-10-CM | POA: Diagnosis not present

## 2023-07-30 DIAGNOSIS — B351 Tinea unguium: Secondary | ICD-10-CM | POA: Diagnosis not present

## 2023-07-30 DIAGNOSIS — L03032 Cellulitis of left toe: Secondary | ICD-10-CM | POA: Diagnosis not present

## 2023-08-13 ENCOUNTER — Telehealth: Payer: Self-pay

## 2023-08-13 NOTE — Telephone Encounter (Signed)
 Copied from CRM [id number]. Topic: Clinical - Medical Advice >> Aug 13, 2023 10:02 AM Leila BROCKS wrote: Reason for CRM: Patient [phone] states has a Trelegy machine, the tech checks every quarterly and was advised patient check with pulmonologist for further sleep study testing or anything updated since patient will be going to [facility] insurance soon probably between August and October 2025. Patient saw 05/25/23 with NP, Cobb and advised to follow up with Dr. Isaiah in 1 year. Patient does not remember last sleep study but it's been a few years. Please advise and call back.

## 2023-08-13 NOTE — Telephone Encounter (Signed)
 Spoke with pt and got her scheduled to see Dr. Isaiah on 10/12/23 at 11:15 to continue CPAP care and in case of needing a new sleep study for insurance purposes.

## 2023-08-14 ENCOUNTER — Other Ambulatory Visit (INDEPENDENT_AMBULATORY_CARE_PROVIDER_SITE_OTHER): Payer: Self-pay | Admitting: Vascular Surgery

## 2023-08-14 DIAGNOSIS — N186 End stage renal disease: Secondary | ICD-10-CM

## 2023-08-15 ENCOUNTER — Ambulatory Visit: Attending: Nurse Practitioner | Admitting: Nurse Practitioner

## 2023-08-15 ENCOUNTER — Other Ambulatory Visit (INDEPENDENT_AMBULATORY_CARE_PROVIDER_SITE_OTHER)

## 2023-08-15 ENCOUNTER — Encounter: Payer: Self-pay | Admitting: Nurse Practitioner

## 2023-08-15 VITALS — BP 142/80 | HR 98 | Ht 61.0 in | Wt 353.0 lb

## 2023-08-15 DIAGNOSIS — E785 Hyperlipidemia, unspecified: Secondary | ICD-10-CM | POA: Diagnosis not present

## 2023-08-15 DIAGNOSIS — I5032 Chronic diastolic (congestive) heart failure: Secondary | ICD-10-CM

## 2023-08-15 DIAGNOSIS — E1169 Type 2 diabetes mellitus with other specified complication: Secondary | ICD-10-CM

## 2023-08-15 DIAGNOSIS — N186 End stage renal disease: Secondary | ICD-10-CM | POA: Diagnosis not present

## 2023-08-15 DIAGNOSIS — I4821 Permanent atrial fibrillation: Secondary | ICD-10-CM

## 2023-08-15 DIAGNOSIS — I10 Essential (primary) hypertension: Secondary | ICD-10-CM | POA: Diagnosis not present

## 2023-08-15 DIAGNOSIS — E669 Obesity, unspecified: Secondary | ICD-10-CM

## 2023-08-15 DIAGNOSIS — Z992 Dependence on renal dialysis: Secondary | ICD-10-CM

## 2023-08-15 MED ORDER — METOPROLOL SUCCINATE ER 50 MG PO TB24: 50.0000 mg | ORAL_TABLET | Freq: Every day | ORAL | 3 refills | Status: DC

## 2023-08-15 NOTE — Progress Notes (Signed)
 Office Visit    Patient Name: Nicole Schmidt Date of Encounter: 08/15/2023  Primary Care Provider:  Epifanio Alm SQUIBB, MD Primary Cardiologist:  Deatrice Cage, MD  Chief Complaint    58 y.o. female with a history of breast cancer status post radiation, chronic HFpEF, permanent atrial fibrillation, hypertension, hyperlipidemia, type 2 diabetes mellitus, end-stage renal disease, anemia, obesity, hypothyroidism, and pulm embolism, who presents for follow-up related to HFpEF.   Past Medical History   Subjective   Past Medical History:  Diagnosis Date   (HFpEF) heart failure with preserved ejection fraction (HCC)    a. 10/2018 Echo: EF 60-65%, diast dysfxn, RVSP 50.7 mmHg; b. 01/2020 Echo: EF 65-70%, G2DD; c. 01/2021 Echo: EF 60-65%, no rwma, mod LVH. Nl RV fxn, mildly enlarged RV. Mod BAE. Triv MR. d. 05/2021 Echo: EF 55-60; no RWMAs, mild LVH, mild TR; e. 06/2023 Echo: EF 55-60%, no rwma, GrI DD, mod elev PASP, mildly dil LA, no signif valvular dzs.   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 Er/PR pos, her 2 negative.  Margins 1 mm.   COPD (chronic obstructive pulmonary disease) (HCC)    Diabetes mellitus type 2 in obese 02/09/2020   Difficult intubation 09/28/2021   a.) mallampati III-IV; BMI >55 kg/m   Diverticulosis    Dyslipidemia 02/09/2020   ESBL (extended spectrum beta-lactamase) producing bacteria infection 03/20/2021   a. urine culture (+) for ESBL producing E.coli and P. mirabilis   ESRD (end stage renal disease) on dialysis (HCC)    a. T-Th-Sat HD initiated 02/2021   Essential hypertension    Gout    History of kidney stones    Long term current use of anticoagulant    a.) apixaban    Menopause    age 18   Morbid obesity (HCC)    OSA treated with BiPAP    a.  on nocturnal PAP therapy; Trilogy 100 ventilator   Permanent atrial fibrillation (HCC)    a. Dx 10/2018 in setting of PNA. b. CHA2DS2-VASc = 7 (sex, HFpEF, HTN, PE x2, aortic plaque, T2DM); c. s/p successful DCCV 11/2018. d. subsequent recurrent Afib; rate/rhythm maintained on oral metoprolol  succinate; chronically anticoagulated using standard dose apixaban .   Personal history of radiation therapy    Pulmonary embolism (HCC) 10/2014   a. on chronic apixaban    Restrictive lung disease    Shingles 10/24/2021   Sleep difficulties    a.) takes melatonin   Thyroid  goiter    Past Surgical History:  Procedure Laterality Date   A/V FISTULAGRAM Left 09/02/2021   Procedure: A/V Fistulagram;  Surgeon: Jama Cordella MATSU, MD;  Location: [facility];  Service: Cardiovascular;  Laterality: Left;   A/V FISTULAGRAM Left 06/27/2022   Procedure: A/V Fistulagram;  Surgeon: Jama Cordella MATSU, MD;  Location: [facility];  Service: Cardiovascular;  Laterality: Left;   A/V FISTULAGRAM Left 01/05/2023   Procedure: A/V Fistulagram;  Surgeon: Jama Cordella MATSU, MD;  Location: [facility];  Service: Cardiovascular;  Laterality: Left;   A/V FISTULAGRAM Left 07/06/2023   Procedure: A/V Fistulagram;  Surgeon: Jama Cordella MATSU, MD;  Location: [facility];  Service: Cardiovascular;  Laterality: Left;   AV FISTULA PLACEMENT Left 07/22/2021  Procedure: ARTERIOVENOUS (AV) FISTULA CREATION (RADIALCEPHALIC);  Surgeon: Jama Cordella MATSU, MD;  Location: [facility];  Service: Vascular;  Laterality: Left;   AV FISTULA PLACEMENT Left 12/09/2021   Procedure: ARTERIOVENOUS (AV) FISTULA CREATION (BRACHIAL CEPHALIC);  Surgeon: Jama Cordella MATSU, MD;  Location: [facility];  Service: Vascular;  Laterality: Left;   BREAST BIOPSY Right 2014   breast ca   BREAST EXCISIONAL BIOPSY Right 07/19/2012   breast ca   BREAST SURGERY Right 2014   with sentinel node bx subareaolar duct excision   CARDIOVERSION  N/A 11/28/2018   Procedure: CARDIOVERSION;  Surgeon: Perla Evalene PARAS, MD;  Location: [facility];  Service: Cardiovascular;  Laterality: N/A;   COLONOSCOPY WITH PROPOFOL  N/A 01/30/2017   Procedure: COLONOSCOPY WITH PROPOFOL ;  Surgeon: Dellie Louanne MATSU, MD;  Location: [facility];  Service: Endoscopy;  Laterality: N/A;   CYSTOSCOPY W/ RETROGRADES  06/17/2021   Procedure: CYSTOSCOPY WITH RETROGRADE PYELOGRAM;  Surgeon: Francisca Redell BROCKS, MD;  Location: [facility];  Service: Urology;;   CYSTOSCOPY W/ URETERAL STENT PLACEMENT Left 02/14/2021   Procedure: CYSTOSCOPY WITH RETROGRADE PYELOGRAM/URETERAL STENT PLACEMENT;  Surgeon: Francisca Redell BROCKS, MD;  Location: [facility];  Service: Urology;  Laterality: Left;   CYSTOSCOPY/URETEROSCOPY/HOLMIUM LASER/STENT PLACEMENT Left 06/17/2021   Procedure: CYSTOSCOPY/URETEROSCOPY/HOLMIUM LASER/STENT EXCHANGE;  Surgeon: Francisca Redell BROCKS, MD;  Location: [facility];  Service: Urology;  Laterality: Left;   DIALYSIS/PERMA CATHETER INSERTION N/A 02/24/2021   Procedure: DIALYSIS/PERMA CATHETER INSERTION;  Surgeon: Marea Selinda RAMAN, MD;  Location: [facility];  Service: Cardiovascular;  Laterality: N/A;   DIALYSIS/PERMA CATHETER INSERTION N/A 12/16/2021   Procedure: DIALYSIS/PERMA CATHETER INSERTION;  Surgeon: Jama Cordella MATSU, MD;  Location: [facility];  Service: Cardiovascular;  Laterality: N/A;   DIALYSIS/PERMA CATHETER REMOVAL N/A 09/04/2022   Procedure: DIALYSIS/PERMA CATHETER REMOVAL;  Surgeon: Marea Selinda RAMAN, MD;  Location: [facility];  Service: Cardiovascular;  Laterality: N/A;   PERIPHERAL VASCULAR THROMBECTOMY Left 09/28/2021   Procedure: PERIPHERAL VASCULAR THROMBECTOMY;  Surgeon: Jama Cordella MATSU, MD;  Location: [facility];  Service: Cardiovascular;  Laterality: Left;   REVISON OF ARTERIOVENOUS FISTULA Left 04/28/2022   Procedure: REVISON OF ARTERIOVENOUS FISTULA (BRACHIAL CEPHALIC);  Surgeon: Jama Cordella MATSU, MD;  Location: [facility];  Service: Vascular;  Laterality: Left;    Allergies  Allergies  Allergen Reactions   Spironolactone      Hyperkalemia  Pt unaware of this allergy       History of Present Illness      58 y.o. y/o female with above complex past medical history including breast cancer status postradiation, chronic HFpEF, permanent atrial fibrillation, hypertension, hyperlipidemia, diabetes, end-stage renal disease, anemia, obesity, hypothyroidism, and pulmonary embolism.  She has been on chronic Eliquis  since PE in September 2016.  She was diagnosed with atrial fibrillation in September 2020, and subsequently underwent cardioversion in October 2020, without significant improvement in dyspnea and fatigue.  In December 2021, she was hospitalized with respiratory failure secondary to heart failure and pneumonia.  Echo showed EF of 65% with severe LVH.  Spironolactone  was discontinued secondary to hyperkalemia.  She had recurrent atrial fibrillation July 2022 and was minimally symptomatic.  Due to multiple comorbidities, it was felt that maintaining sinus rhythm would be difficult and decision was made to continue a rate control strategy.  She was initiated on hemodialysis in January 2023 in the setting of admission for heart failure and acute renal failure.  In the setting of obesity hypoventilation syndrome, she was prescribed  a trilogy device at home.  She required ER visit in March 2023 secondary to hypoxia and altered mental status that occurred after she took off her trilogy device and then failed to reapply it.  Troponin was mildly elevated at 161.  Limited echo at that time showed an EF of 55 to 60% without regional wall motion abnormalities.  Due to body habitus and lack of symptoms, we did not pursue ischemic testing.  She has since been on Tuesday, Thursday, Saturday hemodialysis.     Ms. Kawasaki was doing well in clinic in 02/2023.  She was admitted to [facility] in May 2025 in the setting of  community-acquired pneumonia with possible aspiration, respiratory failure, HFpEF, and AFib RVR (in the context of acute illness and permanent afib).  Echo during admission showed an EF of 55 to 60% with moderately elevated PASP and no significant valvular disease.  She notes that since her hospitalization, she has been somewhat slow to recover.  She was discharged home on oxygen , which she continues to wear with ambulation.  She does have some degree of chronic dyspnea on exertion.  She denies chest pain, palpitations, PND, orthopnea, dizziness, syncope, or early satiety.  She has chronic, mild lower extremity swelling.  She generally tolerates hemodialysis well though notes that she must take midodrine  the morning of and sometimes an additional midodrine  during hemodialysis due to pressures dropping into the 90s or below.  She is interested in coming off of clonidine  if possible as it does cause dry mouth and low blood pressures. Objective   Home Medications    Current Outpatient Medications  Medication Sig Dispense Refill   acetaminophen  (TYLENOL ) 325 MG tablet Take 2 tablets (650 mg total) by mouth every 6 (six) hours as needed for mild pain (or Fever >/= 101).     apixaban  (ELIQUIS ) 5 MG TABS tablet Take 1 tablet (5 mg total) by mouth 2 (two) times daily. 180 tablet 1   aspirin  EC 81 MG tablet Take 1 tablet (81 mg total) by mouth daily. Swallow whole. 150 tablet 1   atorvastatin  (LIPITOR) 40 MG tablet Take 40 mg by mouth daily.     cinacalcet  (SENSIPAR ) 30 MG tablet Take 30 mg by mouth daily.     ezetimibe (ZETIA) 10 MG tablet Take 10 mg by mouth daily with supper.     ferric gluconate 250 mg in sodium chloride  0.9 % 250 mL Inject 250 mg into the vein Every Tuesday,Thursday,and Saturday with dialysis.     gabapentin  (NEURONTIN ) 300 MG capsule Take 1 capsule (300 mg total) by mouth at bedtime. 30 capsule 0   Lancets (ONETOUCH DELICA PLUS LANCET30G) MISC      levothyroxine  (SYNTHROID ) 112 MCG  tablet Take 1 tablet (112 mcg total) by mouth daily before breakfast. 30 tablet 0   melatonin 5 MG TABS Take 2 tablets (10 mg total) by mouth at bedtime as needed (sleep). 60 tablet 0   midodrine  (PROAMATINE ) 10 MG tablet Take 1 tablet (10 mg total) by mouth Every Tuesday,Thursday,and Saturday with dialysis. 30 tablet 0   multivitamin (RENA-VIT) TABS tablet Take 1 tablet by mouth daily.     ONETOUCH ULTRA test strip      polyethylene glycol powder (GLYCOLAX /MIRALAX ) 17 GM/SCOOP powder Take 17 g by mouth as needed for mild constipation or moderate constipation.     TRULICITY  1.5 MG/0.5ML SOPN Inject 1.5 mg into the skin every Monday.     VELPHORO 500 MG chewable tablet Chew 1,000 mg  by mouth 3 (three) times daily.     albuterol  (VENTOLIN  HFA) 108 (90 Base) MCG/ACT inhaler Inhale 2 puffs into the lungs every 6 (six) hours as needed for wheezing or shortness of breath. (Patient not taking: Reported on 08/15/2023) 8 g 2   CRANBERRY PO Take 1 capsule by mouth daily. (Patient not taking: Reported on 08/15/2023)     fluticasone -salmeterol (ADVAIR DISKUS) 100-50 MCG/ACT AEPB Inhale 1 puff into the lungs 2 (two) times daily. (Patient not taking: Reported on 08/15/2023) 60 each 6   metoprolol  succinate (TOPROL -XL) 50 MG 24 hr tablet Take 1 tablet (50 mg total) by mouth at bedtime. Take with or immediately following a meal. 30 tablet 3   No current facility-administered medications for this visit.     Physical Exam    VS:  BP (!) 142/80   Pulse 98   Ht 5' 1 (1.549 m)   Wt (!) 353 lb (160.1 kg)   LMP 07/17/2012   SpO2 91%   BMI 66.70 kg/m  , BMI Body mass index is 66.7 kg/m.          GEN: Obese, in no acute distress. HEENT: normal. Neck: Supple, no JVD, carotid bruits, or masses. Cardiac: RRR, no murmurs, rubs, or gallops. No clubbing, cyanosis, 1+ bilateral lower extremity woody edema.  Radials 2+/PT 2+ and equal bilaterally.  Respiratory:  Respirations regular and unlabored, clear to auscultation  bilaterally. GI: Soft, nontender, nondistended, BS + x 4. MS: no deformity or atrophy. Skin: warm and dry, no rash. Neuro:  Strength and sensation are intact. Psych: Normal affect.  Accessory Clinical Findings    ECG personally reviewed by me today - EKG Interpretation Date/Time:  Wednesday August 15 2023 10:15:23 EDT Ventricular Rate:  98 PR Interval:    QRS Duration:  88 QT Interval:  360 QTC Calculation: 459 R Axis:   14  Text Interpretation: Atrial fibrillation Low voltage QRS Septal infarct , age undetermined Confirmed by Vivienne Bruckner [phone]) on 08/15/2023 11:04:28 AM  - no acute changes.  Lab Results  Component Value Date   WBC 13.6 (H) 07/14/2023   HGB 11.5 (L) 07/14/2023   HCT 35.4 (L) 07/14/2023   MCV 112.4 (H) 07/14/2023   PLT 205 07/14/2023   Lab Results  Component Value Date   CREATININE 8.06 (H) 07/14/2023   BUN 65 (H) 07/14/2023   NA 131 (L) 07/14/2023   K 4.2 07/14/2023   CL 93 (L) 07/14/2023   CO2 24 07/14/2023   Lab Results  Component Value Date   ALT 31 03/02/2023   AST 24 03/02/2023   ALKPHOS 206 (H) 03/02/2023   BILITOT 0.6 03/02/2023   Lab Results  Component Value Date   CHOL 140 03/02/2023   HDL 36 (L) 03/02/2023   LDLCALC 67 03/02/2023   TRIG 225 (H) 03/02/2023   CHOLHDL 3.9 03/02/2023    Lab Results  Component Value Date   HGBA1C 8.3 (H) 07/11/2023   Lab Results  Component Value Date   TSH 0.856 03/26/2021       Assessment & Plan    1.  Permanent atrial fibrillation: Rates were elevated during hospitalization from pneumonia in May 2025.  Heart rate is 98 today.  In light of elevated heart rates and soft blood pressures on dialysis days, I am going to titrate her metoprolol  to 50 mg nightly and wean off her clonidine .  Hopefully, this will allow for better rate control and blood pressure stabilization so that she will have  less frequent need for midodrine  during dialysis.  She remains anticoagulated with Eliquis  with stable H&H  in May 2025.  2.  Chronic HFpEF: EF 55 to 60% by echo in May 2025, without significant valvular disease.  She has chronic, mild lower extremity edema and volume status is managed with hemodialysis.  Blood pressure is elevated this morning though she notes that she did not take her medications last night.  Adjusting metoprolol  to address the elevated heart rates and weaning off clonidine .  3.  Primary hypertension: Blood pressure elevated today after missing medicine last night.  Adjusting regimen in the setting of very low blood pressures requiring midodrine  during dialysis, ongoing elevated heart rates in the setting of permanent atrial fibrillation, and intolerance to clonidine  with dry mouth.  Weaning off clonidine  and increasing Toprol -XL to 50 mg daily.  Would look to titrate beta-blocker further prior to considering adding back clonidine  or alternate agent.  4.  Hyperlipidemia: Lipid panel in January with an LDL of 67.  AST and ALT were normal at that time.  She remains on Setia and atorvastatin .  5.  Type 2 diabetes mellitus: A1c was 8.3 in May.  Insulin  therapy per primary care.  6.  End-stage renal disease: Tuesday Thursday Saturday hemodialysis schedule.  Followed by nephrology.  7.  Disposition: Patient will contact us  with blood pressure recordings.  Otherwise, follow-up in clinic in 6 months or sooner if necessary.  Lonni Meager, NP 08/15/2023, 12:46 PM

## 2023-08-15 NOTE — Patient Instructions (Signed)
 Medication Instructions:  - stop clonidine    -increase metoprolol  to 50 mg daily at bedtime   *If you need a refill on your cardiac medications before your next appointment, please call your pharmacy*  Lab Work: No labs ordered today  If you have labs (blood work) drawn today and your tests are completely normal, you will receive your results only by: MyChart Message (if you have MyChart) OR A paper copy in the mail If you have any lab test that is abnormal or we need to change your treatment, we will call you to review the results.  Testing/Procedures: No test ordered today   Follow-Up: At [facility], you and your health needs are our priority.  As part of our continuing mission to provide you with exceptional heart care, our providers are all part of one team.  This team includes your primary Cardiologist (physician) and Advanced Practice Providers or APPs (Physician Assistants and Nurse Practitioners) who all work together to provide you with the care you need, when you need it.  Your next appointment:   6 month(s)  Provider:   You may see Deatrice Cage, MD or one of the following Advanced Practice Providers on your designated Care Team:   Lonni Meager, NP  We recommend signing up for the patient portal called MyChart.  Sign up information is provided on this After Visit Summary.  MyChart is used to connect with patients for Virtual Visits (Telemedicine).  Patients are able to view lab/test results, encounter notes, upcoming appointments, etc.  Non-urgent messages can be sent to your provider as well.   To learn more about what you can do with MyChart, go to [email or web address].

## 2023-08-19 DIAGNOSIS — E662 Morbid (severe) obesity with alveolar hypoventilation: Secondary | ICD-10-CM | POA: Diagnosis not present

## 2023-08-22 DIAGNOSIS — E039 Hypothyroidism, unspecified: Secondary | ICD-10-CM | POA: Diagnosis not present

## 2023-08-22 DIAGNOSIS — E118 Type 2 diabetes mellitus with unspecified complications: Secondary | ICD-10-CM | POA: Diagnosis not present

## 2023-08-22 DIAGNOSIS — E221 Hyperprolactinemia: Secondary | ICD-10-CM | POA: Diagnosis not present

## 2023-08-22 DIAGNOSIS — E042 Nontoxic multinodular goiter: Secondary | ICD-10-CM | POA: Diagnosis not present

## 2023-08-22 DIAGNOSIS — E7800 Pure hypercholesterolemia, unspecified: Secondary | ICD-10-CM | POA: Diagnosis not present

## 2023-08-22 DIAGNOSIS — E1142 Type 2 diabetes mellitus with diabetic polyneuropathy: Secondary | ICD-10-CM | POA: Diagnosis not present

## 2023-08-27 ENCOUNTER — Ambulatory Visit (INDEPENDENT_AMBULATORY_CARE_PROVIDER_SITE_OTHER): Admitting: Nurse Practitioner

## 2023-08-27 ENCOUNTER — Encounter (INDEPENDENT_AMBULATORY_CARE_PROVIDER_SITE_OTHER): Payer: Self-pay | Admitting: Nurse Practitioner

## 2023-08-27 VITALS — BP 135/71 | HR 98 | Ht 61.0 in | Wt 355.1 lb

## 2023-08-27 DIAGNOSIS — N186 End stage renal disease: Secondary | ICD-10-CM | POA: Diagnosis not present

## 2023-08-27 DIAGNOSIS — E785 Hyperlipidemia, unspecified: Secondary | ICD-10-CM | POA: Diagnosis not present

## 2023-08-27 DIAGNOSIS — E1165 Type 2 diabetes mellitus with hyperglycemia: Secondary | ICD-10-CM

## 2023-08-27 NOTE — Progress Notes (Signed)
 Subjective:    Patient ID: Nicole Schmidt, female    DOB: [birth year], 58 y.o.   MRN: [id number] Chief Complaint  Patient presents with   Follow up with Schnier, Cordella MATSU, MD (Vascular Surgery) in    The patient returns to the office for followup status post intervention of their dialysis access left upper extremity fistulogram on 07/06/2023.   Following the intervention the access function has significantly improved, with better flow rates and improved KT/V. The patient has not been experiencing increased bleeding times following decannulation and the patient denies increased recirculation. The patient denies an increase in arm swelling. At the present time the patient denies hand pain.  No recent shortening of the patient's walking distance or new symptoms consistent with claudication.  No history of rest pain symptoms. No new ulcers or wounds of the lower extremities have occurred.  The patient denies amaurosis fugax or recent TIA symptoms. There are no recent neurological changes noted. There is no history of DVT, PE or superficial thrombophlebitis. No recent episodes of angina or shortness of breath documented.   Duplex ultrasound of the AV access shows a patent access.  The previously noted stenosis is improved compared to last study.  Flow volume today is 1213 cc/min (previous flow volume was 1242 cc/min).  There is some elevated velocities in the distal upper arm near the takeoff.       Review of Systems  Cardiovascular:  Negative for leg swelling.  All other systems reviewed and are negative.      Objective:   Physical Exam Vitals reviewed.  HENT:     Head: Normocephalic.  Cardiovascular:     Rate and Rhythm: Normal rate.     Pulses:          Radial pulses are 2+ on the left side.     Arteriovenous access: Left arteriovenous access is present.     Comments: Good thrill and bruit Pulmonary:     Effort: Pulmonary effort is normal.  Skin:    General: Skin is warm and  dry.  Neurological:     Mental Status: She is alert and oriented to person, place, and time.  Psychiatric:        Mood and Affect: Mood normal.        Behavior: Behavior normal.        Thought Content: Thought content normal.        Judgment: Judgment normal.     BP 135/71   Pulse 98   Ht 5' 1 (1.549 m)   Wt (!) 355 lb 2 oz (161.1 kg)   LMP 07/17/2012   BMI 67.10 kg/m   Past Medical History:  Diagnosis Date   (HFpEF) heart failure with preserved ejection fraction (HCC)    a. 10/2018 Echo: EF 60-65%, diast dysfxn, RVSP 50.7 mmHg; b. 01/2020 Echo: EF 65-70%, G2DD; c. 01/2021 Echo: EF 60-65%, no rwma, mod LVH. Nl RV fxn, mildly enlarged RV. Mod BAE. Triv MR. d. 05/2021 Echo: EF 55-60; no RWMAs, mild LVH, mild TR; e. 06/2023 Echo: EF 55-60%, no rwma, GrI DD, mod elev PASP, mildly dil LA, no signif valvular dzs.   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 Er/PR pos, her 2 negative.  Margins  1 mm.   COPD (chronic obstructive pulmonary disease) (HCC)    Diabetes mellitus type 2 in obese 02/09/2020   Difficult intubation 09/28/2021   a.) mallampati III-IV; BMI >55 kg/m   Diverticulosis    Dyslipidemia 02/09/2020   ESBL (extended spectrum beta-lactamase) producing bacteria infection 03/20/2021   a. urine culture (+) for ESBL producing E.coli and P. mirabilis   ESRD (end stage renal disease) on dialysis (HCC)    a. T-Th-Sat HD initiated 02/2021   Essential hypertension    Gout    History of kidney stones    Long term current use of anticoagulant    a.) apixaban    Menopause    age 1   Morbid obesity (HCC)    OSA treated with BiPAP    a. on nocturnal PAP therapy; Trilogy 100 ventilator   Permanent atrial fibrillation (HCC)    a. Dx 10/2018 in setting of PNA. b. CHA2DS2-VASc = 7  (sex, HFpEF, HTN, PE x2, aortic plaque, T2DM); c. s/p successful DCCV 11/2018. d. subsequent recurrent Afib; rate/rhythm maintained on oral metoprolol  succinate; chronically anticoagulated using standard dose apixaban .   Personal history of radiation therapy    Pulmonary embolism (HCC) 10/2014   a. on chronic apixaban    Restrictive lung disease    Shingles 10/24/2021   Sleep difficulties    a.) takes melatonin   Thyroid  goiter     Social History   Socioeconomic History   Marital status: Single    Spouse name: Not on file   Number of children: Not on file   Years of education: Not on file   Highest education level: Not on file  Occupational History   Occupation: retired  Tobacco Use   Smoking status: Never    Passive exposure: Never   Smokeless tobacco: Never  Vaping Use   Vaping status: Never Used  Substance and Sexual Activity   Alcohol use: No    Alcohol/week: 0.0 standard drinks of alcohol   Drug use: No   Sexual activity: Not on file  Other Topics Concern   Not on file  Social History Narrative   Lives in [place] by herself and retired.    Social Drivers of Corporate investment banker Strain: Low Risk  (12/22/2022)   Received from [facility]   Overall Financial Resource Strain (CARDIA)    Difficulty of Paying Living Expenses: Not hard at all  Food Insecurity: Patient Declined (07/10/2023)   Hunger Vital Sign    Worried About Running Out of Food in the Last Year: Patient declined    Ran Out of Food in the Last Year: Patient declined  Transportation Needs: Patient Declined (07/10/2023)   PRAPARE - Administrator, Civil Service (Medical): Patient declined    Lack of Transportation (Non-Medical): Patient declined  Physical Activity: Unknown (11/15/2017)   Exercise Vital Sign    Days of Exercise per Week: Patient declined    Minutes of Exercise per Session: Patient declined  Stress: Unknown (11/15/2017)   [employer] of  Occupational Health - Occupational Stress Questionnaire    Feeling of Stress : Patient declined  Social Connections: Patient Declined (07/10/2023)   Social Connection and Isolation Panel    Frequency of Communication with Friends and Family: Patient declined    Frequency of Social Gatherings with Friends and Family: Patient declined    Attends Religious Services: Patient declined    Active Member of Clubs or Organizations: Patient declined    Attends Club or  Organization Meetings: Patient declined    Marital Status: Patient declined  Intimate Partner Violence: Patient Declined (07/10/2023)   Humiliation, Afraid, Rape, and Kick questionnaire    Fear of Current or Ex-Partner: Patient declined    Emotionally Abused: Patient declined    Physically Abused: Patient declined    Sexually Abused: Patient declined    Past Surgical History:  Procedure Laterality Date   A/V FISTULAGRAM Left 09/02/2021   Procedure: A/V Fistulagram;  Surgeon: Jama Cordella MATSU, MD;  Location: [facility];  Service: Cardiovascular;  Laterality: Left;   A/V FISTULAGRAM Left 06/27/2022   Procedure: A/V Fistulagram;  Surgeon: Jama Cordella MATSU, MD;  Location: [facility];  Service: Cardiovascular;  Laterality: Left;   A/V FISTULAGRAM Left 01/05/2023   Procedure: A/V Fistulagram;  Surgeon: Jama Cordella MATSU, MD;  Location: [facility];  Service: Cardiovascular;  Laterality: Left;   A/V FISTULAGRAM Left 07/06/2023   Procedure: A/V Fistulagram;  Surgeon: Jama Cordella MATSU, MD;  Location: [facility];  Service: Cardiovascular;  Laterality: Left;   AV FISTULA PLACEMENT Left 07/22/2021   Procedure: ARTERIOVENOUS (AV) FISTULA CREATION (RADIALCEPHALIC);  Surgeon: Jama Cordella MATSU, MD;  Location: [facility];  Service: Vascular;  Laterality: Left;   AV FISTULA PLACEMENT Left 12/09/2021   Procedure: ARTERIOVENOUS (AV) FISTULA CREATION (BRACHIAL CEPHALIC);  Surgeon: Jama Cordella MATSU, MD;   Location: [facility];  Service: Vascular;  Laterality: Left;   BREAST BIOPSY Right 2014   breast ca   BREAST EXCISIONAL BIOPSY Right 07/19/2012   breast ca   BREAST SURGERY Right 2014   with sentinel node bx subareaolar duct excision   CARDIOVERSION N/A 11/28/2018   Procedure: CARDIOVERSION;  Surgeon: Perla Evalene PARAS, MD;  Location: [facility];  Service: Cardiovascular;  Laterality: N/A;   COLONOSCOPY WITH PROPOFOL  N/A 01/30/2017   Procedure: COLONOSCOPY WITH PROPOFOL ;  Surgeon: Dellie Louanne MATSU, MD;  Location: [facility];  Service: Endoscopy;  Laterality: N/A;   CYSTOSCOPY W/ RETROGRADES  06/17/2021   Procedure: CYSTOSCOPY WITH RETROGRADE PYELOGRAM;  Surgeon: Francisca Redell BROCKS, MD;  Location: [facility];  Service: Urology;;   CYSTOSCOPY W/ URETERAL STENT PLACEMENT Left 02/14/2021   Procedure: CYSTOSCOPY WITH RETROGRADE PYELOGRAM/URETERAL STENT PLACEMENT;  Surgeon: Francisca Redell BROCKS, MD;  Location: [facility];  Service: Urology;  Laterality: Left;   CYSTOSCOPY/URETEROSCOPY/HOLMIUM LASER/STENT PLACEMENT Left 06/17/2021   Procedure: CYSTOSCOPY/URETEROSCOPY/HOLMIUM LASER/STENT EXCHANGE;  Surgeon: Francisca Redell BROCKS, MD;  Location: [facility];  Service: Urology;  Laterality: Left;   DIALYSIS/PERMA CATHETER INSERTION N/A 02/24/2021   Procedure: DIALYSIS/PERMA CATHETER INSERTION;  Surgeon: Marea Selinda RAMAN, MD;  Location: [facility];  Service: Cardiovascular;  Laterality: N/A;   DIALYSIS/PERMA CATHETER INSERTION N/A 12/16/2021   Procedure: DIALYSIS/PERMA CATHETER INSERTION;  Surgeon: Jama Cordella MATSU, MD;  Location: [facility];  Service: Cardiovascular;  Laterality: N/A;   DIALYSIS/PERMA CATHETER REMOVAL N/A 09/04/2022   Procedure: DIALYSIS/PERMA CATHETER REMOVAL;  Surgeon: Marea Selinda RAMAN, MD;  Location: [facility];  Service: Cardiovascular;  Laterality: N/A;   PERIPHERAL VASCULAR THROMBECTOMY Left 09/28/2021   Procedure: PERIPHERAL VASCULAR THROMBECTOMY;  Surgeon: Jama Cordella MATSU,  MD;  Location: [facility];  Service: Cardiovascular;  Laterality: Left;   REVISON OF ARTERIOVENOUS FISTULA Left 04/28/2022   Procedure: REVISON OF ARTERIOVENOUS FISTULA (BRACHIAL CEPHALIC);  Surgeon: Jama Cordella MATSU, MD;  Location: [facility];  Service: Vascular;  Laterality: Left;    Family History  Problem Relation Age of Onset   Diabetes Father  Hypertension Father    Parkinson's disease Father    Hypertension Mother    Rheum arthritis Mother    Atrial fibrillation Mother    Heart failure Mother    Heart attack Mother        died in her mid-70's.   Colon cancer Cousin 54   Breast cancer Neg Hx     Allergies  Allergen Reactions   Spironolactone      Hyperkalemia  Pt unaware of this allergy       Latest Ref Rng & Units 07/14/2023    4:25 AM 07/13/2023    6:19 AM 07/12/2023    4:58 AM  CBC  WBC 4.0 - 10.5 K/uL 13.6  11.2  12.1   Hemoglobin 12.0 - 15.0 g/dL 88.4  88.3  88.7   Hematocrit 36.0 - 46.0 % 35.4  36.9  34.8   Platelets 150 - 400 K/uL 205  173  171       CMP     Component Value Date/Time   NA 131 (L) 07/14/2023 0845   NA 144 02/27/2020 0931   NA 141 03/09/2014 1533   K 4.2 07/14/2023 0845   K 4.2 03/09/2014 1533   CL 93 (L) 07/14/2023 0845   CL 101 03/09/2014 1533   CO2 24 07/14/2023 0845   CO2 30 03/09/2014 1533   GLUCOSE 169 (H) 07/14/2023 0845   GLUCOSE 95 03/09/2014 1533   BUN 65 (H) 07/14/2023 0845   BUN 40 (H) 02/27/2020 0931   BUN 35 (H) 03/09/2014 1533   CREATININE 8.06 (H) 07/14/2023 0845   CREATININE 1.50 (H) 03/09/2014 1533   CALCIUM  8.3 (L) 07/14/2023 0845   CALCIUM  8.8 03/09/2014 1533   PROT 6.9 03/02/2023 1054   PROT 7.3 03/09/2014 1533   ALBUMIN  3.0 (L) 07/14/2023 0845   ALBUMIN  4.2 03/02/2023 1054   ALBUMIN  3.5 03/09/2014 1533   AST 24 03/02/2023 1054   AST 16 03/09/2014 1533   ALT 31 03/02/2023 1054   ALT 23 03/09/2014 1533   ALKPHOS 206 (H) 03/02/2023 1054   ALKPHOS 113 03/09/2014 1533   BILITOT 0.6 03/02/2023  1054   BILITOT 0.3 03/09/2014 1533   GFRNONAA 5 (L) 07/14/2023 0845   GFRNONAA 39 (L) 03/09/2014 1533   GFRNONAA 55 (L) 07/31/2013 1521     No results found.     Assessment & Plan:   1. ESRD (end stage renal disease) (HCC) (Primary) Recommend:  The patient is doing well and currently has adequate dialysis access.  Although there are some parameters suggesting possible future issues.  The patient's dialysis center is not reporting any major access issues.  This raises concerns that the access is at moderate but not high risk for a problem or thrombosis and should be followed more closely  The patient will follow-up with me in the office in 3 months.  The need for a follow up duplex ultrasound will be made at that time based on whether problems with the access are persistent.    2. Controlled type 2 diabetes mellitus with hyperglycemia, without long-term current use of insulin  (HCC) Continue hypoglycemic medications as already ordered, these medications have been reviewed and there are no changes at this time.  Hgb A1C to be monitored as already arranged by primary service  3. Dyslipidemia Continue statin as ordered and reviewed, no changes at this time   Current Outpatient Medications on File Prior to Visit  Medication Sig Dispense Refill   acetaminophen  (TYLENOL ) 325 MG tablet Take  2 tablets (650 mg total) by mouth every 6 (six) hours as needed for mild pain (or Fever >/= 101).     apixaban  (ELIQUIS ) 5 MG TABS tablet Take 1 tablet (5 mg total) by mouth 2 (two) times daily. 180 tablet 1   aspirin  EC 81 MG tablet Take 1 tablet (81 mg total) by mouth daily. Swallow whole. 150 tablet 1   atorvastatin  (LIPITOR) 40 MG tablet Take 40 mg by mouth daily.     cinacalcet  (SENSIPAR ) 30 MG tablet Take 30 mg by mouth daily.     ezetimibe (ZETIA) 10 MG tablet Take 10 mg by mouth daily with supper.     ferric gluconate 250 mg in sodium chloride  0.9 % 250 mL Inject 250 mg into the vein Every  Tuesday,Thursday,and Saturday with dialysis.     fluticasone -salmeterol (ADVAIR DISKUS) 100-50 MCG/ACT AEPB Inhale 1 puff into the lungs 2 (two) times daily. 60 each 6   gabapentin  (NEURONTIN ) 300 MG capsule Take 1 capsule (300 mg total) by mouth at bedtime. 30 capsule 0   Lancets (ONETOUCH DELICA PLUS LANCET30G) MISC      levothyroxine  (SYNTHROID ) 112 MCG tablet Take 1 tablet (112 mcg total) by mouth daily before breakfast. 30 tablet 0   melatonin 5 MG TABS Take 2 tablets (10 mg total) by mouth at bedtime as needed (sleep). 60 tablet 0   metoprolol  succinate (TOPROL -XL) 50 MG 24 hr tablet Take 1 tablet (50 mg total) by mouth at bedtime. Take with or immediately following a meal. 30 tablet 3   midodrine  (PROAMATINE ) 10 MG tablet Take 1 tablet (10 mg total) by mouth Every Tuesday,Thursday,and Saturday with dialysis. 30 tablet 0   multivitamin (RENA-VIT) TABS tablet Take 1 tablet by mouth daily.     ONETOUCH ULTRA test strip      polyethylene glycol powder (GLYCOLAX /MIRALAX ) 17 GM/SCOOP powder Take 17 g by mouth as needed for mild constipation or moderate constipation.     TRULICITY  1.5 MG/0.5ML SOPN Inject 1.5 mg into the skin every Monday.     VELPHORO 500 MG chewable tablet Chew 1,000 mg by mouth 3 (three) times daily.     albuterol  (VENTOLIN  HFA) 108 (90 Base) MCG/ACT inhaler Inhale 2 puffs into the lungs every 6 (six) hours as needed for wheezing or shortness of breath. (Patient not taking: Reported on 08/27/2023) 8 g 2   CRANBERRY PO Take 1 capsule by mouth daily. (Patient not taking: Reported on 08/27/2023)     No current facility-administered medications on file prior to visit.    There are no Patient Instructions on file for this visit. No follow-ups on file.   Octavion Mollenkopf E Caydance Kuehnle, NP

## 2023-08-29 DIAGNOSIS — E039 Hypothyroidism, unspecified: Secondary | ICD-10-CM | POA: Diagnosis not present

## 2023-08-29 DIAGNOSIS — E221 Hyperprolactinemia: Secondary | ICD-10-CM | POA: Diagnosis not present

## 2023-08-29 DIAGNOSIS — N186 End stage renal disease: Secondary | ICD-10-CM | POA: Diagnosis not present

## 2023-08-29 DIAGNOSIS — E118 Type 2 diabetes mellitus with unspecified complications: Secondary | ICD-10-CM | POA: Diagnosis not present

## 2023-08-30 DIAGNOSIS — E1142 Type 2 diabetes mellitus with diabetic polyneuropathy: Secondary | ICD-10-CM | POA: Diagnosis not present

## 2023-09-12 ENCOUNTER — Other Ambulatory Visit (HOSPITAL_COMMUNITY): Payer: Self-pay

## 2023-09-13 ENCOUNTER — Other Ambulatory Visit (HOSPITAL_COMMUNITY): Payer: Self-pay

## 2023-09-13 ENCOUNTER — Other Ambulatory Visit (HOSPITAL_BASED_OUTPATIENT_CLINIC_OR_DEPARTMENT_OTHER): Payer: Self-pay

## 2023-09-13 MED ORDER — TRULICITY 3 MG/0.5ML ~~LOC~~ SOAJ
3.0000 mg | SUBCUTANEOUS | 4 refills | Status: AC
  Filled 2023-09-13 – 2024-03-11 (×4): qty 2, 28d supply, fill #0

## 2023-09-13 MED ORDER — METOPROLOL SUCCINATE ER 50 MG PO TB24
50.0000 mg | ORAL_TABLET | Freq: Every day | ORAL | 3 refills | Status: AC
  Filled 2023-09-13 – 2023-10-10 (×4): qty 30, 30d supply, fill #0
  Filled 2023-11-05: qty 30, 30d supply, fill #1

## 2023-09-13 MED ORDER — MIDODRINE HCL 10 MG PO TABS
20.0000 mg | ORAL_TABLET | ORAL | 4 refills | Status: AC
  Filled 2023-09-13 (×2): qty 60, 30d supply, fill #0
  Filled 2023-09-19: qty 120, 28d supply, fill #0

## 2023-09-13 MED ORDER — EZETIMIBE 10 MG PO TABS
10.0000 mg | ORAL_TABLET | Freq: Every day | ORAL | 3 refills | Status: AC
  Filled 2023-09-13 – 2023-10-17 (×3): qty 90, 90d supply, fill #0
  Filled 2024-01-11: qty 90, 90d supply, fill #1

## 2023-09-14 ENCOUNTER — Other Ambulatory Visit (HOSPITAL_COMMUNITY): Payer: Self-pay

## 2023-09-19 ENCOUNTER — Other Ambulatory Visit (HOSPITAL_COMMUNITY): Payer: Self-pay

## 2023-10-04 ENCOUNTER — Telehealth: Payer: Self-pay

## 2023-10-04 NOTE — Telephone Encounter (Signed)
 Received CMN, awaiting signature

## 2023-10-10 ENCOUNTER — Other Ambulatory Visit (HOSPITAL_COMMUNITY): Payer: Self-pay

## 2023-10-11 ENCOUNTER — Telehealth: Payer: Self-pay

## 2023-10-11 NOTE — Telephone Encounter (Signed)
 We have received the CMN but will hold until appt on 10/12/23 to make sure she shows up for appt

## 2023-10-11 NOTE — Telephone Encounter (Signed)
 Copied from CRM #[id number]. Topic: Clinical - Order For Equipment >> Oct 11, 2023  9:12 AM Russell PARAS wrote: Reason for CRM:   Dorothe, with [facility] , is contacting clinic to request recent OV notes and CMN to authorize oxygen  supplies for patient. She will also be faxing request today to clinic  FX#  [phone]

## 2023-10-12 ENCOUNTER — Ambulatory Visit: Admitting: Internal Medicine

## 2023-10-12 ENCOUNTER — Encounter: Payer: Self-pay | Admitting: Internal Medicine

## 2023-10-12 VITALS — BP 100/60 | HR 93 | Ht 61.0 in | Wt 354.0 lb

## 2023-10-12 DIAGNOSIS — J9611 Chronic respiratory failure with hypoxia: Secondary | ICD-10-CM | POA: Diagnosis not present

## 2023-10-12 DIAGNOSIS — J984 Other disorders of lung: Secondary | ICD-10-CM

## 2023-10-12 DIAGNOSIS — N186 End stage renal disease: Secondary | ICD-10-CM

## 2023-10-12 DIAGNOSIS — G4733 Obstructive sleep apnea (adult) (pediatric): Secondary | ICD-10-CM | POA: Diagnosis not present

## 2023-10-12 DIAGNOSIS — J9612 Chronic respiratory failure with hypercapnia: Secondary | ICD-10-CM

## 2023-10-12 DIAGNOSIS — Z6841 Body Mass Index (BMI) 40.0 and over, adult: Secondary | ICD-10-CM | POA: Diagnosis not present

## 2023-10-12 DIAGNOSIS — J4520 Mild intermittent asthma, uncomplicated: Secondary | ICD-10-CM

## 2023-10-12 NOTE — Progress Notes (Unsigned)
 @Patient  ID: Nicole Schmidt, female    DOB: [birth year], 58 y.o.   MRN: [id number]  Synopsis 58 year old female, never smoker followed for OSA/OHS and chronic respiratory failure on NIV, restrictive lung disease. She is a former patient of Dr. Magdaleno and last seen in office 10/13/2022 by Hope, NP. Past medical history significant for CHF, ESRD on HD, hx of PE,   TEST/EVENTS:  04/26/2019 HST: AHI 17.1, SpO2 low 54% 03/22/2021 ABG pCO2 79 08/12/2021 PFT: FVC 55, FEV1 60, ratio 84, TLC 85, DLCO 78    CC Follow-up assessment for chronic hypoxic and hypercapnic respiratory failure and sleep apnea Follow-up intermittent reactive airways disease   HPI: Follow-up assessment for chronic hypoxic and hypercapnic respiratory failure Patient currently on noninvasive ventilation Patient is 100% compliance for days and greater than 4 hours Patient needs this for survival Patient is very compliant  Discussed sleep data and reviewed with patient.  Encouraged proper weight management.  Discussed driving precautions and its relationship with hypersomnolence.  Discussed sleep hygiene, and benefits of a fixed sleep waked time.  The importance of getting eight or more hours of sleep discussed with patient.  Discussed limiting the use of the computer and television before bedtime.  Decrease naps during the day, so night time sleep will become enhanced.  Limit caffeine , and sleep deprivation.   Patient uses and benefits from therapy NIV Pressure setting is comfortable and is sleeping well. compliant with NIV. AHI well controlled down to 1.1 Min EPAP 4; Max EPAP 15cm h20; PS 6-22.   Assessment of intermittent reactive airways disease and restrictive lung disease No exacerbation at this time No evidence of heart failure at this time No evidence or signs of infection at this time No respiratory distress No fevers, chills, nausea, vomiting, diarrhea No evidence of lower extremity edema No evidence  hemoptysis Patient's symptoms are controlled with Advair  Chronic Hypoxic resp failure due to COPD -Patient benefits from oxygen  therapy 2L [place]  -recommend using oxygen  as prescribed -patient needs this for survival    Patient sees Dr. Darron for congestive heart failure Patient sees Dr. Lazarus for end-stage renal disease  Allergies  Allergen Reactions   Spironolactone      Hyperkalemia  Pt unaware of this allergy    Immunization History  Administered Date(s) Administered   Influenza-Unspecified 11/05/2018, 12/03/2021   PFIZER Comirnaty(Gray Top)Covid-19 Tri-Sucrose Vaccine 03/25/2022   PFIZER(Purple Top)SARS-COV-2 Vaccination 05/19/2019, 06/16/2019, 02/18/2020   Pfizer Covid-19 Vaccine Bivalent Booster 59yrs & up 11/22/2020   Pneumococcal Polysaccharide-23 02/16/2020    Past Medical History:  Diagnosis Date   (HFpEF) heart failure with preserved ejection fraction (HCC)    a. 10/2018 Echo: EF 60-65%, diast dysfxn, RVSP 50.7 mmHg; b. 01/2020 Echo: EF 65-70%, G2DD; c. 01/2021 Echo: EF 60-65%, no rwma, mod LVH. Nl RV fxn, mildly enlarged RV. Mod BAE. Triv MR. d. 05/2021 Echo: EF 55-60; no RWMAs, mild LVH, mild TR; e. 06/2023 Echo: EF 55-60%, no rwma, GrI DD, mod elev PASP, mildly dil LA, no signif valvular dzs.   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 Er/PR pos, her 2 negative.  Margins 1 mm.   COPD (chronic obstructive pulmonary disease) (HCC)    Diabetes mellitus type 2 in obese  02/09/2020   Difficult intubation 09/28/2021   a.) mallampati III-IV; BMI >55 kg/m   Diverticulosis    Dyslipidemia 02/09/2020   ESBL (extended spectrum beta-lactamase) producing bacteria infection 03/20/2021   a. urine culture (+) for ESBL producing E.coli and P. mirabilis   ESRD (end stage renal  disease) on dialysis (HCC)    a. T-Th-Sat HD initiated 02/2021   Essential hypertension    Gout    History of kidney stones    Long term current use of anticoagulant    a.) apixaban    Menopause    age 1   Morbid obesity (HCC)    OSA treated with BiPAP    a. on nocturnal PAP therapy; Trilogy 100 ventilator   Permanent atrial fibrillation (HCC)    a. Dx 10/2018 in setting of PNA. b. CHA2DS2-VASc = 7 (sex, HFpEF, HTN, PE x2, aortic plaque, T2DM); c. s/p successful DCCV 11/2018. d. subsequent recurrent Afib; rate/rhythm maintained on oral metoprolol  succinate; chronically anticoagulated using standard dose apixaban .   Personal history of radiation therapy    Pulmonary embolism (HCC) 10/2014   a. on chronic apixaban    Restrictive lung disease    Shingles 10/24/2021   Sleep difficulties    a.) takes melatonin   Thyroid  goiter     Tobacco History: Social History   Tobacco Use  Smoking Status Never   Passive exposure: Never  Smokeless Tobacco Never   Counseling given: Not Answered   Outpatient Medications Prior to Visit  Medication Sig Dispense Refill   acetaminophen  (TYLENOL ) 325 MG tablet Take 2 tablets (650 mg total) by mouth every 6 (six) hours as needed for mild pain (or Fever >/= 101).     albuterol  (VENTOLIN  HFA) 108 (90 Base) MCG/ACT inhaler Inhale 2 puffs into the lungs every 6 (six) hours as needed for wheezing or shortness of breath. (Patient not taking: Reported on 08/27/2023) 8 g 2   apixaban  (ELIQUIS ) 5 MG TABS tablet Take 1 tablet (5 mg total) by mouth 2 (two) times daily. 180 tablet 1   aspirin  EC 81 MG tablet Take 1 tablet (81 mg total) by mouth daily. Swallow whole. 150 tablet 1   atorvastatin  (LIPITOR) 40 MG tablet Take 40 mg by mouth daily.     cinacalcet  (SENSIPAR ) 30 MG tablet Take 30 mg by mouth daily.     CRANBERRY PO Take 1 capsule by mouth daily. (Patient not taking: Reported on 08/27/2023)     Dulaglutide  (TRULICITY ) 3 MG/0.5ML SOAJ Inject 3 mg into the  skin once a week. 2 mL 4   ezetimibe  (ZETIA ) 10 MG tablet Take 10 mg by mouth daily with supper.     ezetimibe  (ZETIA ) 10 MG tablet Take 1 tablet (10 mg total) by mouth daily. 90 tablet 3   ferric gluconate 250 mg in sodium chloride  0.9 % 250 mL Inject 250 mg into the vein Every Tuesday,Thursday,and Saturday with dialysis.     fluticasone -salmeterol (ADVAIR DISKUS) 100-50 MCG/ACT AEPB Inhale 1 puff into the lungs 2 (two) times daily. 60 each 6   gabapentin  (NEURONTIN ) 300 MG capsule Take 1 capsule (300 mg total) by mouth at bedtime. 30 capsule 0   Lancets (ONETOUCH DELICA PLUS LANCET30G) MISC      levothyroxine  (SYNTHROID ) 112 MCG tablet Take 1 tablet (112 mcg total) by mouth daily before breakfast. 30 tablet 0   melatonin 5 MG TABS Take 2 tablets (10 mg total) by mouth at bedtime as needed (sleep). 60 tablet 0   metoprolol  succinate (  TOPROL -XL) 50 MG 24 hr tablet Take 1 tablet (50 mg total) by mouth at bedtime. Take with or immediately following a meal. 30 tablet 3   metoprolol  succinate (TOPROL -XL) 50 MG 24 hr tablet Take 1 tablet (50 mg total) by mouth at bedtime with or immediately following a meal 30 tablet 3   midodrine  (PROAMATINE ) 10 MG tablet Take 1 tablet (10 mg total) by mouth Every Tuesday,Thursday,and Saturday with dialysis. 30 tablet 0   midodrine  (PROAMATINE ) 10 MG tablet Take 2 tablets (20 mg total) by mouth with each dialysis treatment 120 tablet 4   multivitamin (RENA-VIT) TABS tablet Take 1 tablet by mouth daily.     ONETOUCH ULTRA test strip      polyethylene glycol powder (GLYCOLAX /MIRALAX ) 17 GM/SCOOP powder Take 17 g by mouth as needed for mild constipation or moderate constipation.     TRULICITY  1.5 MG/0.5ML SOPN Inject 1.5 mg into the skin every Monday.     VELPHORO 500 MG chewable tablet Chew 1,000 mg by mouth 3 (three) times daily.     No facility-administered medications prior to visit.   BP 100/60   Pulse 93   Ht 5' 1 (1.549 m)   Wt (!) 354 lb (160.6 kg)   LMP  07/17/2012   SpO2 96% Comment: on RA.  BMI 66.89 kg/m      Review of Systems: Gen:  Denies  fever, sweats, chills weight loss  HEENT: Denies blurred vision, double vision, ear pain, eye pain, hearing loss, nose bleeds, sore throat Cardiac:  No dizziness, chest pain or heaviness, chest tightness,edema, No JVD Resp:   No cough, -sputum production, +shortness of breath,-wheezing, -hemoptysis,  Other:  All other systems negative   Physical Examination:   General Appearance: No distress  EYES PERRLA, EOM intact.   NECK Supple, No JVD Pulmonary: normal breath sounds, No wheezing.  CardiovascularNormal S1,S2.  No m/r/g.   Abdomen: Benign, Soft, non-tender. Neurology UE/LE 5/5 strength, no focal deficits Ext pulses intact, cap refill intact ALL OTHER ROS ARE NEGATIVE   Lab Results:  CBC    Component Value Date/Time   WBC 13.6 (H) 07/14/2023 0425   RBC 3.15 (L) 07/14/2023 0425   HGB 11.5 (L) 07/14/2023 0425   HGB 9.9 (L) 02/27/2020 0931   HCT 35.4 (L) 07/14/2023 0425   HCT 32.2 (L) 02/27/2020 0931   PLT 205 07/14/2023 0425   PLT 232 02/27/2020 0931   MCV 112.4 (H) 07/14/2023 0425   MCV 89 02/27/2020 0931   MCV 90 03/09/2014 1533   MCH 36.5 (H) 07/14/2023 0425   MCHC 32.5 07/14/2023 0425   RDW 14.6 07/14/2023 0425   RDW 13.0 02/27/2020 0931   RDW 15.2 (H) 03/09/2014 1533   LYMPHSABS 1.8 04/21/2022 1109   LYMPHSABS 1.5 03/09/2014 1533   MONOABS 0.8 04/21/2022 1109   MONOABS 0.6 03/09/2014 1533   EOSABS 0.2 04/21/2022 1109   EOSABS 0.2 03/09/2014 1533   BASOSABS 0.0 04/21/2022 1109   BASOSABS 0.1 03/09/2014 1533    BMET    Component Value Date/Time   NA 131 (L) 07/14/2023 0845   NA 144 02/27/2020 0931   NA 141 03/09/2014 1533   K 4.2 07/14/2023 0845   K 4.2 03/09/2014 1533   CL 93 (L) 07/14/2023 0845   CL 101 03/09/2014 1533   CO2 24 07/14/2023 0845   CO2 30 03/09/2014 1533   GLUCOSE 169 (H) 07/14/2023 0845   GLUCOSE 95 03/09/2014 1533   BUN 65 (H)  07/14/2023  0845   BUN 40 (H) 02/27/2020 0931   BUN 35 (H) 03/09/2014 1533   CREATININE 8.06 (H) 07/14/2023 0845   CREATININE 1.50 (H) 03/09/2014 1533   CALCIUM  8.3 (L) 07/14/2023 0845   CALCIUM  8.8 03/09/2014 1533   GFRNONAA 5 (L) 07/14/2023 0845   GFRNONAA 39 (L) 03/09/2014 1533   GFRNONAA 55 (L) 07/31/2013 1521   GFRAA 35 (L) 02/27/2020 0931   GFRAA 47 (L) 03/09/2014 1533   GFRAA >60 07/31/2013 1521    BNP    Component Value Date/Time   BNP 221.3 (H) 07/10/2023 2032       Latest Ref Rng & Units 04/12/2021   10:31 AM  PFT Results  FVC-Pre L 1.35   FVC-Predicted Pre % 39   Pre FEV1/FVC % % 82   FEV1-Pre L 1.11   FEV1-Predicted Pre % 42     No results found for: NITRICOXIDE      Assessment & Plan:  58 year old pleasant white female with significant morbid obesity and deconditioned state with underlying diagnosis of congestive heart failure with a reactive airways disease with end-stage renal disease with underlying chronic hypoxic and hypercapnic respiratory failure with sleep apnea and cardiorenal syndrome Assessment & Plan OSA (obstructive sleep apnea) Patient currently on noninvasive ventilation And she needs to  survive Previous AHI 17 Excellent compliance report Reviewed compliance report in detail with patient Pressure setting is comfortable and is sleeping well. NIV Min EPAP 4; Max EPAP 15cm h20; PS 6-22. AHI reduced to 1.1 Average uses average 7.5 hours 100% compliance  No evidence of acute heart failure at this time No respiratory distress No fevers, chills, nausea, vomiting, diarrhea No evidence hemoptysis  Patient Instructions minimum of 4-6 hours a night.  Change equipment every 30 days or as directed by DME.  Wash your tubing with warm soap and water daily, hang to dry. Wash humidifier portion weekly. Use bottled, distilled water and change daily   Be aware of reduced alertness and do not drive or operate heavy machinery if experiencing this  or drowsiness.  Exercise encouraged, as tolerated. Encouraged proper weight management.  Important to get eight or more hours of sleep  Limiting the use of the computer and television before bedtime.  Decrease naps during the day, so night time sleep will become enhanced.  Limit caffeine , and sleep deprivation.  HTN, stroke, uncontrolled diabetes and heart failure are potential risk factors.  Risk of untreated sleep apnea including cardiac arrhthymias, stroke, DM, pulm HTN.   Morbid obesity with BMI of 60.0-69.9, adult (HCC) Obesity -recommend significant weight loss -recommend changing diet  Deconditioned state -Recommend increased daily activity and exercise  Mild intermittent reactive airway disease without complication Avoid Allergens and Irritants Avoid secondhand smoke Avoid SICK contacts Recommend  Masking  when appropriate Recommend Keep up-to-date with vaccinations Continue Advair as prescribed No exacerbation at this time No evidence of heart failure at this time No evidence or signs of infection at this time No respiratory distress No fevers, chills, nausea, vomiting, diarrhea No evidence of lower extremity edema No evidence hemoptysis  Restrictive lung disease Will related to morbid obesity which is also complicated with reactive airways disease ESRD (end stage renal disease) (HCC) Follow-up nephrology Chronic respiratory failure with hypoxia and hypercapnia (HCC) Patient needs noninvasive ventilator for survival This will prevent recurrent hospitalizations and death Recommend continuing noninvasive ventilation and patient is very compliant Continue oxygen  as prescribed Patient needs this for survival  ESRD on hemodialysis (HCC) - Continues hemodialysis Tuesday,  Thursday and Saturday.      CURRENT MEDICATIONS REVIEWED AT LENGTH WITH PATIENT TODAY   Patient  satisfied with Plan of action and management. All questions answered   Follow up 6  months   I spent a total of 58 minutes dedicated to the care of this patient on the date of this encounter to include pre-visit review of records, face-to-face time with the patient discussing conditions above, post visit ordering of testing, clinical documentation with the electronic health record, making appropriate referrals as documented, and communicating necessary information to the patient's healthcare team.    The Patient requires high complexity decision making for assessment and support, frequent evaluation and titration of therapies, application of advanced monitoring technologies and extensive interpretation of multiple databases.  Patient satisfied with Plan of action and management. All questions answered    Nickolas Alm Cellar, M.D.  [facility]  Medical Director [facility] Medical Director [facility] Cardio-Pulmonary Department

## 2023-10-12 NOTE — Assessment & Plan Note (Signed)
Follow up nephrology.  ?

## 2023-10-12 NOTE — Assessment & Plan Note (Signed)
 Will related to morbid obesity which is also complicated with reactive airways disease

## 2023-10-12 NOTE — Assessment & Plan Note (Signed)
 Patient currently on noninvasive ventilation And she needs to  survive Previous AHI 17 Excellent compliance report Reviewed compliance report in detail with patient Pressure setting is comfortable and is sleeping well. NIV Min EPAP 4; Max EPAP 15cm h20; PS 6-22. AHI reduced to 1.1 Average uses average 7.5 hours 100% compliance  No evidence of acute heart failure at this time No respiratory distress No fevers, chills, nausea, vomiting, diarrhea No evidence hemoptysis  Patient Instructions minimum of 4-6 hours a night.  Change equipment every 30 days or as directed by DME.  Wash your tubing with warm soap and water daily, hang to dry. Wash humidifier portion weekly. Use bottled, distilled water and change daily   Be aware of reduced alertness and do not drive or operate heavy machinery if experiencing this or drowsiness.  Exercise encouraged, as tolerated. Encouraged proper weight management.  Important to get eight or more hours of sleep  Limiting the use of the computer and television before bedtime.  Decrease naps during the day, so night time sleep will become enhanced.  Limit caffeine , and sleep deprivation.  HTN, stroke, uncontrolled diabetes and heart failure are potential risk factors.  Risk of untreated sleep apnea including cardiac arrhthymias, stroke, DM, pulm HTN.

## 2023-10-12 NOTE — Patient Instructions (Signed)
 Excellent Job A+ GOLD STAR!!  Continue CPAP as prescribed  Patient Instructions Continue to use CPAP every night, minimum of 4-6 hours a night.  Change equipment every 30 days or as directed by DME.  Wash your tubing with warm soap and water daily, hang to dry. Wash humidifier portion weekly. Use bottled, distilled water and change daily   Be aware of reduced alertness and do not drive or operate heavy machinery if experiencing this or drowsiness.  Exercise encouraged, as tolerated. Encouraged proper weight management.  Important to get eight or more hours of sleep  Limiting the use of the computer and television before bedtime.  Decrease naps during the day, so night time sleep will become enhanced.  Limit caffeine , and sleep deprivation.    Avoid Allergens and Irritants Avoid secondhand smoke Avoid SICK contacts Recommend  Masking  when appropriate Recommend Keep up-to-date with vaccinations  Continue Advair 2 puffs in the morning 2 puffs at night Please rinse mouth after use  Follow-up with cardiology Follow-up with nephrology

## 2023-10-12 NOTE — Assessment & Plan Note (Signed)
 Obesity -recommend significant weight loss -recommend changing diet  Deconditioned state -Recommend increased daily activity and exercise

## 2023-10-14 DIAGNOSIS — Z992 Dependence on renal dialysis: Secondary | ICD-10-CM | POA: Diagnosis not present

## 2023-10-14 DIAGNOSIS — N186 End stage renal disease: Secondary | ICD-10-CM | POA: Diagnosis not present

## 2023-10-16 NOTE — Telephone Encounter (Unsigned)
 Copied from CRM [id number]. Topic: Clinical - Order For Equipment >> Oct 16, 2023  4:45 PM Celestine FALCON wrote: Reason for CRM: Pt stated she keeps getting calls from [facility] needing Dr. Jacqulyn face to face notes from the last visit on 10/12/2023 sent to them via fax at [phone].The pt also mentioned they need agreement from Dr. Isaiah that she still needs to use the concentrator at night for her trillegy.

## 2023-10-17 ENCOUNTER — Other Ambulatory Visit: Payer: Self-pay

## 2023-10-18 NOTE — Telephone Encounter (Signed)
 Nothing further needed

## 2023-10-19 NOTE — Telephone Encounter (Signed)
 Faxed cmn to [phone] and received confirmation

## 2023-10-22 ENCOUNTER — Telehealth: Payer: Self-pay

## 2023-10-22 ENCOUNTER — Other Ambulatory Visit: Payer: Self-pay

## 2023-10-22 ENCOUNTER — Other Ambulatory Visit: Payer: Self-pay | Admitting: Oncology

## 2023-10-22 ENCOUNTER — Telehealth: Payer: Self-pay | Admitting: Cardiovascular Disease

## 2023-10-22 ENCOUNTER — Other Ambulatory Visit (HOSPITAL_COMMUNITY): Payer: Self-pay

## 2023-10-22 DIAGNOSIS — I2782 Chronic pulmonary embolism: Secondary | ICD-10-CM

## 2023-10-22 MED ORDER — APIXABAN 5 MG PO TABS
5.0000 mg | ORAL_TABLET | Freq: Two times a day (BID) | ORAL | 1 refills | Status: AC
  Filled 2023-10-22 – 2023-10-29 (×2): qty 180, 90d supply, fill #0
  Filled 2024-01-22: qty 180, 90d supply, fill #1

## 2023-10-22 MED ORDER — APIXABAN 5 MG PO TABS: 5.0000 mg | ORAL_TABLET | Freq: Two times a day (BID) | ORAL | 1 refills | Status: DC

## 2023-10-22 NOTE — Telephone Encounter (Signed)
 Call placed to the patient to let her know that we have not received any forms from [employer].  She has been advised that we will call them today. [employer] has been called and notified. Nothing further needed

## 2023-10-22 NOTE — Telephone Encounter (Signed)
 Per message from Grayce She called my cell phone and Asked if I would get a note to Dr Darold nurse to call her. She called the main line and said she didn't get nowhere. She would not tell me why she was calling. I do know she had breast cancer in the past. I would appreciate if you gave her a call.  Outbound call; stated she needs a refill for Eliquis .  Informed I just processed [facility]; patient said she had a change of insurance to [facility] and is now requesting it be sent to [facility].  Reprocessed refill to be sent to [facility]; patient stated she spoke to [facility] earlier today requesting it be sent mail order but that was prior to initial approval.  Told patient I would loop back with [facility] and make sure it's established for mail order; patient verbalized understanding.

## 2023-10-22 NOTE — Telephone Encounter (Signed)
 Spoke with representative at [facility] who confirmed Eliquis  is set up on mail order.

## 2023-10-22 NOTE — Telephone Encounter (Signed)
 Patient says in order to qualify for [employer] [facility] plan they need to confirm patient's diagnosis as either diabetes or chronic heart failure. Patient says she has both. Patient says, per [employer], the Attestation Form has been faxed to Dr. Renato office 3x with no response. Patient says at this point they are willing to confirm patient's diagnosis verbally. They can be reached at [phone], per patient.

## 2023-10-22 NOTE — Telephone Encounter (Signed)
-----   Message from Grayce FALCON sent at 10/22/2023  1:29 PM EDT ----- Regarding: Call Back Contact: [phone] Hi sweetheart. I know this lady. She grew up with my husband. She called my cell phone and Asked if I would get a note to Dr Darold nurse to call her. She called the main line and said she didn't get nowhere. She would not tell me why she was calling. I do know she had breast cancer in the past. I would appreciate if you gave her a call.

## 2023-10-23 ENCOUNTER — Other Ambulatory Visit: Payer: Self-pay

## 2023-10-23 ENCOUNTER — Encounter: Payer: Self-pay | Admitting: Pharmacist

## 2023-10-25 ENCOUNTER — Other Ambulatory Visit: Payer: Self-pay

## 2023-10-29 ENCOUNTER — Other Ambulatory Visit (HOSPITAL_COMMUNITY): Payer: Self-pay

## 2023-10-29 MED ORDER — ATORVASTATIN CALCIUM 40 MG PO TABS
40.0000 mg | ORAL_TABLET | Freq: Every day | ORAL | 3 refills | Status: AC
  Filled 2023-10-29 – 2023-10-30 (×3): qty 90, 90d supply, fill #0
  Filled 2024-01-22: qty 90, 90d supply, fill #1

## 2023-10-30 ENCOUNTER — Other Ambulatory Visit: Payer: Self-pay

## 2023-10-30 ENCOUNTER — Other Ambulatory Visit (HOSPITAL_COMMUNITY): Payer: Self-pay

## 2023-10-30 DIAGNOSIS — J9610 Chronic respiratory failure, unspecified whether with hypoxia or hypercapnia: Secondary | ICD-10-CM | POA: Diagnosis not present

## 2023-10-30 DIAGNOSIS — E662 Morbid (severe) obesity with alveolar hypoventilation: Secondary | ICD-10-CM | POA: Diagnosis not present

## 2023-10-31 ENCOUNTER — Telehealth: Payer: Self-pay

## 2023-10-31 NOTE — Telephone Encounter (Unsigned)
 Copied from CRM [id number]. Topic: Clinical - Prescription Issue >> Oct 31, 2023  3:25 PM Leila BROCKS wrote: Reason for CRM: Patient [phone] states does not have an emergency inhaler and is asking for Dr. Isaiah to prescribe that will be covered under her insurance? Patient has a Trelegy machine and respiratory therapist recommended patient to have an emergency inhaler. Please advise and call back.   Patient states has to use [facility] [address] [place] [address] Phone:[phone]

## 2023-11-01 ENCOUNTER — Other Ambulatory Visit: Payer: Self-pay | Admitting: Internal Medicine

## 2023-11-01 ENCOUNTER — Other Ambulatory Visit (HOSPITAL_COMMUNITY): Payer: Self-pay

## 2023-11-01 ENCOUNTER — Other Ambulatory Visit: Payer: Self-pay

## 2023-11-01 DIAGNOSIS — J449 Chronic obstructive pulmonary disease, unspecified: Secondary | ICD-10-CM

## 2023-11-01 MED ORDER — ALBUTEROL SULFATE HFA 108 (90 BASE) MCG/ACT IN AERS
2.0000 | INHALATION_SPRAY | Freq: Four times a day (QID) | RESPIRATORY_TRACT | 2 refills | Status: AC | PRN
  Filled 2023-11-01: qty 6.7, 25d supply, fill #0

## 2023-11-01 NOTE — Progress Notes (Signed)
Request for albuterol 

## 2023-11-01 NOTE — Telephone Encounter (Signed)
 Left detailed message, advising that medication has been sent to pharmacy. NFN.

## 2023-11-05 ENCOUNTER — Other Ambulatory Visit (HOSPITAL_COMMUNITY): Payer: Self-pay

## 2023-11-13 DIAGNOSIS — N186 End stage renal disease: Secondary | ICD-10-CM | POA: Diagnosis not present

## 2023-11-13 DIAGNOSIS — Z992 Dependence on renal dialysis: Secondary | ICD-10-CM | POA: Diagnosis not present

## 2023-11-29 DIAGNOSIS — J9610 Chronic respiratory failure, unspecified whether with hypoxia or hypercapnia: Secondary | ICD-10-CM | POA: Diagnosis not present

## 2023-11-29 DIAGNOSIS — E662 Morbid (severe) obesity with alveolar hypoventilation: Secondary | ICD-10-CM | POA: Diagnosis not present

## 2023-12-03 ENCOUNTER — Ambulatory Visit (INDEPENDENT_AMBULATORY_CARE_PROVIDER_SITE_OTHER): Admitting: Nurse Practitioner

## 2023-12-03 ENCOUNTER — Encounter (INDEPENDENT_AMBULATORY_CARE_PROVIDER_SITE_OTHER)

## 2023-12-05 ENCOUNTER — Other Ambulatory Visit (HOSPITAL_COMMUNITY): Payer: Self-pay

## 2023-12-05 ENCOUNTER — Telehealth: Payer: Self-pay | Admitting: Cardiovascular Disease

## 2023-12-05 ENCOUNTER — Ambulatory Visit
Admission: RE | Admit: 2023-12-05 | Discharge: 2023-12-05 | Disposition: A | Discharge status: Home / Self Care | Source: Ambulatory Visit | Attending: Oncology | Admitting: Oncology

## 2023-12-05 DIAGNOSIS — Z1231 Encounter for screening mammogram for malignant neoplasm of breast: Secondary | ICD-10-CM | POA: Diagnosis not present

## 2023-12-05 DIAGNOSIS — B351 Tinea unguium: Secondary | ICD-10-CM | POA: Diagnosis not present

## 2023-12-05 DIAGNOSIS — E1140 Type 2 diabetes mellitus with diabetic neuropathy, unspecified: Secondary | ICD-10-CM | POA: Diagnosis not present

## 2023-12-05 DIAGNOSIS — L851 Acquired keratosis [keratoderma] palmaris et plantaris: Secondary | ICD-10-CM | POA: Diagnosis not present

## 2023-12-05 DIAGNOSIS — Z853 Personal history of malignant neoplasm of breast: Secondary | ICD-10-CM | POA: Diagnosis not present

## 2023-12-05 DIAGNOSIS — Z08 Encounter for follow-up examination after completed treatment for malignant neoplasm: Secondary | ICD-10-CM | POA: Diagnosis not present

## 2023-12-05 MED ORDER — METOPROLOL SUCCINATE ER 50 MG PO TB24
50.0000 mg | ORAL_TABLET | Freq: Every day | ORAL | 2 refills | Status: AC
  Filled 2023-12-05: qty 90, 90d supply, fill #0
  Filled 2024-03-02: qty 90, 90d supply, fill #1

## 2023-12-05 NOTE — Telephone Encounter (Signed)
 Rx sent in

## 2023-12-05 NOTE — Telephone Encounter (Signed)
 *  STAT* If patient is at the pharmacy, call can be transferred to refill team.   1. Which medications need to be refilled? (please list name of each medication and dose if known)   metoprolol  succinate (TOPROL -XL) 50 MG 24 hr tablet   2. Which pharmacy/location (including street and city if local pharmacy) is medication to be sent to?  [place] [facility]    3. Do they need a 30 day or 90 day supply? 90 day

## 2023-12-14 DIAGNOSIS — Z992 Dependence on renal dialysis: Secondary | ICD-10-CM | POA: Diagnosis not present

## 2023-12-14 DIAGNOSIS — N186 End stage renal disease: Secondary | ICD-10-CM | POA: Diagnosis not present

## 2023-12-15 DIAGNOSIS — D509 Iron deficiency anemia, unspecified: Secondary | ICD-10-CM | POA: Diagnosis not present

## 2023-12-15 DIAGNOSIS — D631 Anemia in chronic kidney disease: Secondary | ICD-10-CM | POA: Diagnosis not present

## 2023-12-15 DIAGNOSIS — N186 End stage renal disease: Secondary | ICD-10-CM | POA: Diagnosis not present

## 2023-12-15 DIAGNOSIS — Z992 Dependence on renal dialysis: Secondary | ICD-10-CM | POA: Diagnosis not present

## 2023-12-18 ENCOUNTER — Other Ambulatory Visit (INDEPENDENT_AMBULATORY_CARE_PROVIDER_SITE_OTHER): Payer: Self-pay | Admitting: Nurse Practitioner

## 2023-12-18 DIAGNOSIS — N186 End stage renal disease: Secondary | ICD-10-CM

## 2023-12-19 ENCOUNTER — Ambulatory Visit (INDEPENDENT_AMBULATORY_CARE_PROVIDER_SITE_OTHER)

## 2023-12-19 ENCOUNTER — Ambulatory Visit (INDEPENDENT_AMBULATORY_CARE_PROVIDER_SITE_OTHER): Admitting: Nurse Practitioner

## 2023-12-19 ENCOUNTER — Encounter (INDEPENDENT_AMBULATORY_CARE_PROVIDER_SITE_OTHER): Payer: Self-pay | Admitting: Nurse Practitioner

## 2023-12-19 VITALS — BP 129/77 | HR 97 | Resp 16 | Ht 61.0 in | Wt 353.2 lb

## 2023-12-19 DIAGNOSIS — N186 End stage renal disease: Secondary | ICD-10-CM

## 2023-12-19 DIAGNOSIS — E1165 Type 2 diabetes mellitus with hyperglycemia: Secondary | ICD-10-CM | POA: Diagnosis not present

## 2023-12-19 DIAGNOSIS — E785 Hyperlipidemia, unspecified: Secondary | ICD-10-CM

## 2023-12-20 ENCOUNTER — Telehealth (INDEPENDENT_AMBULATORY_CARE_PROVIDER_SITE_OTHER): Payer: Self-pay

## 2023-12-20 NOTE — Telephone Encounter (Signed)
 Spoke with the patient and she is scheduled with Dr. Jama for a left arm fistulagram on 01/01/24 with a 1:00 pm arrival time to the [facility]. Pre-procedure instructions were discussed and will be sent to Mychart and mailed.

## 2023-12-24 ENCOUNTER — Encounter (INDEPENDENT_AMBULATORY_CARE_PROVIDER_SITE_OTHER): Payer: Self-pay | Admitting: Nurse Practitioner

## 2023-12-24 NOTE — H&P (View-Only) (Signed)
 Subjective:    Patient ID: Nicole Schmidt, female    DOB: 08/21/1965, 58 y.o.   MRN: [id number] Chief Complaint  Patient presents with   Follow-up    3 month HDA    The patient returns to the office for followup of their dialysis access.   The patient reports the function of the access has been stable. Patient denies difficulty with cannulation. The patient denies increased bleeding time after removing the needles. The patient denies hand pain or other symptoms consistent with steal phenomena.  No significant arm swelling.  The patient denies any complaints from the dialysis center or their nephrologist.  The patient denies redness or swelling at the access site. The patient denies fever or chills at home or while on dialysis.  No recent shortening of the patient's walking distance or new symptoms consistent with claudication.  No history of rest pain symptoms. No new ulcers or wounds of the lower extremities have occurred.  The patient denies amaurosis fugax or recent TIA symptoms. There are no recent neurological changes noted. There is no history of DVT, PE or superficial thrombophlebitis. No recent episodes of angina or shortness of breath documented.   Duplex ultrasound of the AV access shows a patent access.  The previously noted stenosis is not significantly changed compared to last study.  Flow volume today is 1122 cc/min (previous flow volume was 1213 cc/min).  However on physical exam it is noted that the bruit has a whistling sensation.      Review of Systems  Hematological:  Does not bruise/bleed easily.  All other systems reviewed and are negative.      Objective:   Physical Exam Vitals reviewed.  HENT:     Head: Normocephalic.  Cardiovascular:     Rate and Rhythm: Normal rate.     Pulses:          Radial pulses are 2+ on the right side.     Arteriovenous access: Left arteriovenous access is present.     Comments: Whistling bruit Pulmonary:     Effort:  Pulmonary effort is normal.  Skin:    General: Skin is warm and dry.  Neurological:     Mental Status: She is alert and oriented to person, place, and time.  Psychiatric:        Mood and Affect: Mood normal.        Behavior: Behavior normal.        Thought Content: Thought content normal.     BP 129/77   Pulse 97   Resp 16   Ht 5' 1 (1.549 m)   Wt (!) 353 lb 3.2 oz (160.2 kg)   LMP 07/17/2012   BMI 66.74 kg/m   Past Medical History:  Diagnosis Date   (HFpEF) heart failure with preserved ejection fraction (HCC)    a. 10/2018 Echo: EF 60-65%, diast dysfxn, RVSP 50.7 mmHg; b. 01/2020 Echo: EF 65-70%, G2DD; c. 01/2021 Echo: EF 60-65%, no rwma, mod LVH. Nl RV fxn, mildly enlarged RV. Mod BAE. Triv MR. d. 05/2021 Echo: EF 55-60; no RWMAs, mild LVH, mild TR; e. 06/2023 Echo: EF 55-60%, no rwma, GrI DD, mod elev PASP, mildly dil LA, no signif valvular dzs.   Acquired hypothyroidism 02/09/2020   Adenoma of left adrenal gland    Anemia    Aortic atherosclerosis    Arthritis    Breast cancer of upper-inner quadrant of right female breast (HCC)    Right breast invasive CA and DCIS ,  7 mm T1,N0,M0. Er/PR pos, her 2 negative.  Margins 1 mm.   COPD (chronic obstructive pulmonary disease) (HCC)    Diabetes mellitus type 2 in obese 02/09/2020   Difficult intubation 09/28/2021   a.) mallampati III-IV; BMI >55 kg/m   Diverticulosis    Dyslipidemia 02/09/2020   ESBL (extended spectrum beta-lactamase) producing bacteria infection 03/20/2021   a. urine culture (+) for ESBL producing E.coli and P. mirabilis   ESRD (end stage renal disease) on dialysis (HCC)    a. T-Th-Sat HD initiated 02/2021   Essential hypertension    Gout    History of kidney stones    Long term current use of anticoagulant    a.) apixaban    Menopause    age 49   Morbid obesity (HCC)    OSA treated with BiPAP    a. on nocturnal PAP therapy; Trilogy 100 ventilator   Permanent atrial fibrillation (HCC)    a. Dx 10/2018  in setting of PNA. b. CHA2DS2-VASc = 7 (sex, HFpEF, HTN, PE x2, aortic plaque, T2DM); c. s/p successful DCCV 11/2018. d. subsequent recurrent Afib; rate/rhythm maintained on oral metoprolol  succinate; chronically anticoagulated using standard dose apixaban .   Personal history of radiation therapy    Pulmonary embolism (HCC) 10/2014   a. on chronic apixaban    Restrictive lung disease    Shingles 10/24/2021   Sleep difficulties    a.) takes melatonin   Thyroid  goiter     Social History   Socioeconomic History   Marital status: Single    Spouse name: Not on file   Number of children: Not on file   Years of education: Not on file   Highest education level: Not on file  Occupational History   Occupation: retired  Tobacco Use   Smoking status: Never    Passive exposure: Never   Smokeless tobacco: Never  Vaping Use   Vaping status: Never Used  Substance and Sexual Activity   Alcohol use: No    Alcohol/week: 0.0 standard drinks of alcohol   Drug use: No   Sexual activity: Not on file  Other Topics Concern   Not on file  Social History Narrative   Lives in [place] by herself and retired.    Social Drivers of Health   Financial Resource Strain: Low Risk  (12/22/2022)   [facility]   Overall Financial Resource Strain (CARDIA)    Difficulty of Paying Living Expenses: Not hard at all  Food Insecurity: Patient Declined (07/10/2023)   Hunger Vital Sign    Worried About Running Out of Food in the Last Year: Patient declined    Ran Out of Food in the Last Year: Patient declined  Transportation Needs: Patient Declined (07/10/2023)   PRAPARE - Administrator, Civil Service (Medical): Patient declined    Lack of Transportation (Non-Medical): Patient declined  Physical Activity: Unknown (11/15/2017)   Exercise Vital Sign    Days of Exercise per Week: Patient declined    Minutes of Exercise per Session: Patient declined  Stress: Unknown  (11/15/2017)   [employer] of Occupational Health - Occupational Stress Questionnaire    Feeling of Stress : Patient declined  Social Connections: Patient Declined (07/10/2023)   Social Connection and Isolation Panel    Frequency of Communication with Friends and Family: Patient declined    Frequency of Social Gatherings with Friends and Family: Patient declined    Attends Religious Services: Patient declined    Active Member of [employer]  or Organizations: Patient declined    Attends Banker Meetings: Patient declined    Marital Status: Patient declined  Intimate Partner Violence: Patient Declined (07/10/2023)   Humiliation, Afraid, Rape, and Kick questionnaire    Fear of Current or Ex-Partner: Patient declined    Emotionally Abused: Patient declined    Physically Abused: Patient declined    Sexually Abused: Patient declined    Past Surgical History:  Procedure Laterality Date   A/V FISTULAGRAM Left 09/02/2021   Procedure: A/V Fistulagram;  Surgeon: Jama Cordella MATSU, MD;  Location: [facility];  Service: Cardiovascular;  Laterality: Left;   A/V FISTULAGRAM Left 06/27/2022   Procedure: A/V Fistulagram;  Surgeon: Jama Cordella MATSU, MD;  Location: [facility];  Service: Cardiovascular;  Laterality: Left;   A/V FISTULAGRAM Left 01/05/2023   Procedure: A/V Fistulagram;  Surgeon: Jama Cordella MATSU, MD;  Location: [facility];  Service: Cardiovascular;  Laterality: Left;   A/V FISTULAGRAM Left 07/06/2023   Procedure: A/V Fistulagram;  Surgeon: Jama Cordella MATSU, MD;  Location: [facility];  Service: Cardiovascular;  Laterality: Left;   AV FISTULA PLACEMENT Left 07/22/2021   Procedure: ARTERIOVENOUS (AV) FISTULA CREATION (RADIALCEPHALIC);  Surgeon: Jama Cordella MATSU, MD;  Location: [facility];  Service: Vascular;  Laterality: Left;   AV FISTULA PLACEMENT Left 12/09/2021   Procedure: ARTERIOVENOUS (AV) FISTULA CREATION (BRACHIAL CEPHALIC);   Surgeon: Jama Cordella MATSU, MD;  Location: [facility];  Service: Vascular;  Laterality: Left;   BREAST BIOPSY Right 2014   breast ca   BREAST EXCISIONAL BIOPSY Right 07/19/2012   breast ca   BREAST SURGERY Right 2014   with sentinel node bx subareaolar duct excision   CARDIOVERSION N/A 11/28/2018   Procedure: CARDIOVERSION;  Surgeon: Perla Evalene PARAS, MD;  Location: [facility];  Service: Cardiovascular;  Laterality: N/A;   COLONOSCOPY WITH PROPOFOL  N/A 01/30/2017   Procedure: COLONOSCOPY WITH PROPOFOL ;  Surgeon: Dellie Louanne MATSU, MD;  Location: [facility];  Service: Endoscopy;  Laterality: N/A;   CYSTOSCOPY W/ RETROGRADES  06/17/2021   Procedure: CYSTOSCOPY WITH RETROGRADE PYELOGRAM;  Surgeon: Francisca Redell BROCKS, MD;  Location: [facility];  Service: Urology;;   CYSTOSCOPY W/ URETERAL STENT PLACEMENT Left 02/14/2021   Procedure: CYSTOSCOPY WITH RETROGRADE PYELOGRAM/URETERAL STENT PLACEMENT;  Surgeon: Francisca Redell BROCKS, MD;  Location: [facility];  Service: Urology;  Laterality: Left;   CYSTOSCOPY/URETEROSCOPY/HOLMIUM LASER/STENT PLACEMENT Left 06/17/2021   Procedure: CYSTOSCOPY/URETEROSCOPY/HOLMIUM LASER/STENT EXCHANGE;  Surgeon: Francisca Redell BROCKS, MD;  Location: [facility];  Service: Urology;  Laterality: Left;   DIALYSIS/PERMA CATHETER INSERTION N/A 02/24/2021   Procedure: DIALYSIS/PERMA CATHETER INSERTION;  Surgeon: Marea Selinda RAMAN, MD;  Location: [facility];  Service: Cardiovascular;  Laterality: N/A;   DIALYSIS/PERMA CATHETER INSERTION N/A 12/16/2021   Procedure: DIALYSIS/PERMA CATHETER INSERTION;  Surgeon: Jama Cordella MATSU, MD;  Location: [facility];  Service: Cardiovascular;  Laterality: N/A;   DIALYSIS/PERMA CATHETER REMOVAL N/A 09/04/2022   Procedure: DIALYSIS/PERMA CATHETER REMOVAL;  Surgeon: Marea Selinda RAMAN, MD;  Location: [facility];  Service: Cardiovascular;  Laterality: N/A;   PERIPHERAL VASCULAR THROMBECTOMY Left 09/28/2021   Procedure: PERIPHERAL VASCULAR  THROMBECTOMY;  Surgeon: Jama Cordella MATSU, MD;  Location: [facility];  Service: Cardiovascular;  Laterality: Left;   REVISON OF ARTERIOVENOUS FISTULA Left 04/28/2022   Procedure: REVISON OF ARTERIOVENOUS FISTULA (BRACHIAL CEPHALIC);  Surgeon: Jama Cordella MATSU, MD;  Location: [facility];  Service: Vascular;  Laterality: Left;    Family History  Problem Relation Age of Onset   Diabetes Father    Hypertension Father    Parkinson's disease Father    Hypertension Mother    Rheum arthritis Mother    Atrial fibrillation Mother    Heart failure Mother    Heart attack Mother        died in her mid-70's.   Colon cancer Cousin 54   Breast cancer Neg Hx     Allergies  Allergen Reactions   Spironolactone      Hyperkalemia  Pt unaware of this allergy       Latest Ref Rng & Units 07/14/2023    4:25 AM 07/13/2023    6:19 AM 07/12/2023    4:58 AM  CBC  WBC 4.0 - 10.5 K/uL 13.6  11.2  12.1   Hemoglobin 12.0 - 15.0 g/dL 88.4  88.3  88.7   Hematocrit 36.0 - 46.0 % 35.4  36.9  34.8   Platelets 150 - 400 K/uL 205  173  171       CMP     Component Value Date/Time   NA 131 (L) 07/14/2023 0845   NA 144 02/27/2020 0931   NA 141 03/09/2014 1533   K 4.2 07/14/2023 0845   K 4.2 03/09/2014 1533   CL 93 (L) 07/14/2023 0845   CL 101 03/09/2014 1533   CO2 24 07/14/2023 0845   CO2 30 03/09/2014 1533   GLUCOSE 169 (H) 07/14/2023 0845   GLUCOSE 95 03/09/2014 1533   BUN 65 (H) 07/14/2023 0845   BUN 40 (H) 02/27/2020 0931   BUN 35 (H) 03/09/2014 1533   CREATININE 8.06 (H) 07/14/2023 0845   CREATININE 1.50 (H) 03/09/2014 1533   CALCIUM  8.3 (L) 07/14/2023 0845   CALCIUM  8.8 03/09/2014 1533   PROT 6.9 03/02/2023 1054   PROT 7.3 03/09/2014 1533   ALBUMIN  3.0 (L) 07/14/2023 0845   ALBUMIN  4.2 03/02/2023 1054   ALBUMIN  3.5 03/09/2014 1533   AST 24 03/02/2023 1054   AST 16 03/09/2014 1533   ALT 31 03/02/2023 1054   ALT 23 03/09/2014 1533   ALKPHOS 206 (H) 03/02/2023 1054   ALKPHOS  113 03/09/2014 1533   BILITOT 0.6 03/02/2023 1054   BILITOT 0.3 03/09/2014 1533   GFRNONAA 5 (L) 07/14/2023 0845   GFRNONAA 39 (L) 03/09/2014 1533   GFRNONAA 55 (L) 07/31/2013 1521     No results found.     Assessment & Plan:   1. ESRD (end stage renal disease) (HCC) (Primary) Recommend:  The patient is experiencing increasing problems with their dialysis access.  Patient should have a fistulagram with the intention for intervention.  The intention for intervention is to restore appropriate flow and prevent thrombosis and possible loss of the access.  As well as improve the quality of dialysis therapy.  The risks, benefits and alternative therapies were reviewed in detail with the patient.  All questions were answered.  The patient agrees to proceed with angio/intervention.    The patient will follow up with me in the office after the procedure.   2. Controlled type 2 diabetes mellitus with hyperglycemia, without long-term current use of insulin  (HCC) Continue hypoglycemic medications as already ordered, these medications have been reviewed and there are no changes at this time.  Hgb A1C to be monitored as already arranged by primary service  3. Dyslipidemia Continue statin as ordered and reviewed, no changes at this time   Current Outpatient Medications on File Prior to Visit  Medication Sig Dispense  Refill   acetaminophen  (TYLENOL ) 325 MG tablet Take 2 tablets (650 mg total) by mouth every 6 (six) hours as needed for mild pain (or Fever >/= 101).     albuterol  (VENTOLIN  HFA) 108 (90 Base) MCG/ACT inhaler Inhale 2 puffs into the lungs every 6 (six) hours as needed for wheezing or shortness of breath. 8 g 2   albuterol  (VENTOLIN  HFA) 108 (90 Base) MCG/ACT inhaler Inhale 2 puffs into the lungs every 6 (six) hours as needed for wheezing or shortness of breath. 6.7 g 2   apixaban  (ELIQUIS ) 5 MG TABS tablet Take 1 tablet (5 mg total) by mouth 2 (two) times daily. 180 tablet 1    aspirin  EC 81 MG tablet Take 1 tablet (81 mg total) by mouth daily. Swallow whole. 150 tablet 1   atorvastatin  (LIPITOR) 40 MG tablet Take 40 mg by mouth daily.     atorvastatin  (LIPITOR) 40 MG tablet Take 1 tablet (40 mg total) by mouth daily. 90 tablet 3   CRANBERRY PO Take 1 capsule by mouth daily.     Dulaglutide  (TRULICITY ) 3 MG/0.5ML SOAJ Inject 3 mg into the skin once a week. 2 mL 4   ezetimibe  (ZETIA ) 10 MG tablet Take 10 mg by mouth daily with supper.     ezetimibe  (ZETIA ) 10 MG tablet Take 1 tablet (10 mg total) by mouth daily. 90 tablet 3   ferric gluconate 250 mg in sodium chloride  0.9 % 250 mL Inject 250 mg into the vein Every Tuesday,Thursday,and Saturday with dialysis.     fluticasone -salmeterol (ADVAIR DISKUS) 100-50 MCG/ACT AEPB Inhale 1 puff into the lungs 2 (two) times daily. 60 each 6   gabapentin  (NEURONTIN ) 300 MG capsule Take 1 capsule (300 mg total) by mouth at bedtime. 30 capsule 0   Lancets (ONETOUCH DELICA PLUS LANCET30G) MISC      levothyroxine  (SYNTHROID ) 112 MCG tablet Take 1 tablet (112 mcg total) by mouth daily before breakfast. 30 tablet 0   melatonin 5 MG TABS Take 2 tablets (10 mg total) by mouth at bedtime as needed (sleep). 60 tablet 0   metoprolol  succinate (TOPROL -XL) 50 MG 24 hr tablet Take 1 tablet (50 mg total) by mouth at bedtime with or immediately following a meal 30 tablet 3   metoprolol  succinate (TOPROL -XL) 50 MG 24 hr tablet Take 1 tablet (50 mg total) by mouth at bedtime. Take with or immediately following a meal. 90 tablet 2   midodrine  (PROAMATINE ) 10 MG tablet Take 1 tablet (10 mg total) by mouth Every Tuesday,Thursday,and Saturday with dialysis. 30 tablet 0   midodrine  (PROAMATINE ) 10 MG tablet Take 2 tablets (20 mg total) by mouth with each dialysis treatment 120 tablet 4   multivitamin (RENA-VIT) TABS tablet Take 1 tablet by mouth daily.     ONETOUCH ULTRA test strip      polyethylene glycol powder (GLYCOLAX /MIRALAX ) 17 GM/SCOOP powder Take  17 g by mouth as needed for mild constipation or moderate constipation.     TRULICITY  1.5 MG/0.5ML SOPN Inject 1.5 mg into the skin every Monday.     VELPHORO 500 MG chewable tablet Chew 1,000 mg by mouth 3 (three) times daily.     cinacalcet  (SENSIPAR ) 30 MG tablet Take 30 mg by mouth daily.     No current facility-administered medications on file prior to visit.    There are no Patient Instructions on file for this visit. No follow-ups on file.   Wirt Hemmerich E Saryn Cherry, NP

## 2023-12-24 NOTE — Progress Notes (Signed)
 Subjective:    Patient ID: Nicole Schmidt, female    DOB: 08/21/1965, 58 y.o.   MRN: [id number] Chief Complaint  Patient presents with   Follow-up    3 month HDA    The patient returns to the office for followup of their dialysis access.   The patient reports the function of the access has been stable. Patient denies difficulty with cannulation. The patient denies increased bleeding time after removing the needles. The patient denies hand pain or other symptoms consistent with steal phenomena.  No significant arm swelling.  The patient denies any complaints from the dialysis center or their nephrologist.  The patient denies redness or swelling at the access site. The patient denies fever or chills at home or while on dialysis.  No recent shortening of the patient's walking distance or new symptoms consistent with claudication.  No history of rest pain symptoms. No new ulcers or wounds of the lower extremities have occurred.  The patient denies amaurosis fugax or recent TIA symptoms. There are no recent neurological changes noted. There is no history of DVT, PE or superficial thrombophlebitis. No recent episodes of angina or shortness of breath documented.   Duplex ultrasound of the AV access shows a patent access.  The previously noted stenosis is not significantly changed compared to last study.  Flow volume today is 1122 cc/min (previous flow volume was 1213 cc/min).  However on physical exam it is noted that the bruit has a whistling sensation.      Review of Systems  Hematological:  Does not bruise/bleed easily.  All other systems reviewed and are negative.      Objective:   Physical Exam Vitals reviewed.  HENT:     Head: Normocephalic.  Cardiovascular:     Rate and Rhythm: Normal rate.     Pulses:          Radial pulses are 2+ on the right side.     Arteriovenous access: Left arteriovenous access is present.     Comments: Whistling bruit Pulmonary:     Effort:  Pulmonary effort is normal.  Skin:    General: Skin is warm and dry.  Neurological:     Mental Status: She is alert and oriented to person, place, and time.  Psychiatric:        Mood and Affect: Mood normal.        Behavior: Behavior normal.        Thought Content: Thought content normal.     BP 129/77   Pulse 97   Resp 16   Ht 5' 1 (1.549 m)   Wt (!) 353 lb 3.2 oz (160.2 kg)   LMP 07/17/2012   BMI 66.74 kg/m   Past Medical History:  Diagnosis Date   (HFpEF) heart failure with preserved ejection fraction (HCC)    a. 10/2018 Echo: EF 60-65%, diast dysfxn, RVSP 50.7 mmHg; b. 01/2020 Echo: EF 65-70%, G2DD; c. 01/2021 Echo: EF 60-65%, no rwma, mod LVH. Nl RV fxn, mildly enlarged RV. Mod BAE. Triv MR. d. 05/2021 Echo: EF 55-60; no RWMAs, mild LVH, mild TR; e. 06/2023 Echo: EF 55-60%, no rwma, GrI DD, mod elev PASP, mildly dil LA, no signif valvular dzs.   Acquired hypothyroidism 02/09/2020   Adenoma of left adrenal gland    Anemia    Aortic atherosclerosis    Arthritis    Breast cancer of upper-inner quadrant of right female breast (HCC)    Right breast invasive CA and DCIS ,  7 mm T1,N0,M0. Er/PR pos, her 2 negative.  Margins 1 mm.   COPD (chronic obstructive pulmonary disease) (HCC)    Diabetes mellitus type 2 in obese 02/09/2020   Difficult intubation 09/28/2021   a.) mallampati III-IV; BMI >55 kg/m   Diverticulosis    Dyslipidemia 02/09/2020   ESBL (extended spectrum beta-lactamase) producing bacteria infection 03/20/2021   a. urine culture (+) for ESBL producing E.coli and P. mirabilis   ESRD (end stage renal disease) on dialysis (HCC)    a. T-Th-Sat HD initiated 02/2021   Essential hypertension    Gout    History of kidney stones    Long term current use of anticoagulant    a.) apixaban    Menopause    age 49   Morbid obesity (HCC)    OSA treated with BiPAP    a. on nocturnal PAP therapy; Trilogy 100 ventilator   Permanent atrial fibrillation (HCC)    a. Dx 10/2018  in setting of PNA. b. CHA2DS2-VASc = 7 (sex, HFpEF, HTN, PE x2, aortic plaque, T2DM); c. s/p successful DCCV 11/2018. d. subsequent recurrent Afib; rate/rhythm maintained on oral metoprolol  succinate; chronically anticoagulated using standard dose apixaban .   Personal history of radiation therapy    Pulmonary embolism (HCC) 10/2014   a. on chronic apixaban    Restrictive lung disease    Shingles 10/24/2021   Sleep difficulties    a.) takes melatonin   Thyroid  goiter     Social History   Socioeconomic History   Marital status: Single    Spouse name: Not on file   Number of children: Not on file   Years of education: Not on file   Highest education level: Not on file  Occupational History   Occupation: retired  Tobacco Use   Smoking status: Never    Passive exposure: Never   Smokeless tobacco: Never  Vaping Use   Vaping status: Never Used  Substance and Sexual Activity   Alcohol use: No    Alcohol/week: 0.0 standard drinks of alcohol   Drug use: No   Sexual activity: Not on file  Other Topics Concern   Not on file  Social History Narrative   Lives in [place] by herself and retired.    Social Drivers of Health   Financial Resource Strain: Low Risk  (12/22/2022)   [facility]   Overall Financial Resource Strain (CARDIA)    Difficulty of Paying Living Expenses: Not hard at all  Food Insecurity: Patient Declined (07/10/2023)   Hunger Vital Sign    Worried About Running Out of Food in the Last Year: Patient declined    Ran Out of Food in the Last Year: Patient declined  Transportation Needs: Patient Declined (07/10/2023)   PRAPARE - Administrator, Civil Service (Medical): Patient declined    Lack of Transportation (Non-Medical): Patient declined  Physical Activity: Unknown (11/15/2017)   Exercise Vital Sign    Days of Exercise per Week: Patient declined    Minutes of Exercise per Session: Patient declined  Stress: Unknown  (11/15/2017)   [employer] of Occupational Health - Occupational Stress Questionnaire    Feeling of Stress : Patient declined  Social Connections: Patient Declined (07/10/2023)   Social Connection and Isolation Panel    Frequency of Communication with Friends and Family: Patient declined    Frequency of Social Gatherings with Friends and Family: Patient declined    Attends Religious Services: Patient declined    Active Member of [employer]  or Organizations: Patient declined    Attends Banker Meetings: Patient declined    Marital Status: Patient declined  Intimate Partner Violence: Patient Declined (07/10/2023)   Humiliation, Afraid, Rape, and Kick questionnaire    Fear of Current or Ex-Partner: Patient declined    Emotionally Abused: Patient declined    Physically Abused: Patient declined    Sexually Abused: Patient declined    Past Surgical History:  Procedure Laterality Date   A/V FISTULAGRAM Left 09/02/2021   Procedure: A/V Fistulagram;  Surgeon: Jama Cordella MATSU, MD;  Location: [facility];  Service: Cardiovascular;  Laterality: Left;   A/V FISTULAGRAM Left 06/27/2022   Procedure: A/V Fistulagram;  Surgeon: Jama Cordella MATSU, MD;  Location: [facility];  Service: Cardiovascular;  Laterality: Left;   A/V FISTULAGRAM Left 01/05/2023   Procedure: A/V Fistulagram;  Surgeon: Jama Cordella MATSU, MD;  Location: [facility];  Service: Cardiovascular;  Laterality: Left;   A/V FISTULAGRAM Left 07/06/2023   Procedure: A/V Fistulagram;  Surgeon: Jama Cordella MATSU, MD;  Location: [facility];  Service: Cardiovascular;  Laterality: Left;   AV FISTULA PLACEMENT Left 07/22/2021   Procedure: ARTERIOVENOUS (AV) FISTULA CREATION (RADIALCEPHALIC);  Surgeon: Jama Cordella MATSU, MD;  Location: [facility];  Service: Vascular;  Laterality: Left;   AV FISTULA PLACEMENT Left 12/09/2021   Procedure: ARTERIOVENOUS (AV) FISTULA CREATION (BRACHIAL CEPHALIC);   Surgeon: Jama Cordella MATSU, MD;  Location: [facility];  Service: Vascular;  Laterality: Left;   BREAST BIOPSY Right 2014   breast ca   BREAST EXCISIONAL BIOPSY Right 07/19/2012   breast ca   BREAST SURGERY Right 2014   with sentinel node bx subareaolar duct excision   CARDIOVERSION N/A 11/28/2018   Procedure: CARDIOVERSION;  Surgeon: Perla Evalene PARAS, MD;  Location: [facility];  Service: Cardiovascular;  Laterality: N/A;   COLONOSCOPY WITH PROPOFOL  N/A 01/30/2017   Procedure: COLONOSCOPY WITH PROPOFOL ;  Surgeon: Dellie Louanne MATSU, MD;  Location: [facility];  Service: Endoscopy;  Laterality: N/A;   CYSTOSCOPY W/ RETROGRADES  06/17/2021   Procedure: CYSTOSCOPY WITH RETROGRADE PYELOGRAM;  Surgeon: Francisca Redell BROCKS, MD;  Location: [facility];  Service: Urology;;   CYSTOSCOPY W/ URETERAL STENT PLACEMENT Left 02/14/2021   Procedure: CYSTOSCOPY WITH RETROGRADE PYELOGRAM/URETERAL STENT PLACEMENT;  Surgeon: Francisca Redell BROCKS, MD;  Location: [facility];  Service: Urology;  Laterality: Left;   CYSTOSCOPY/URETEROSCOPY/HOLMIUM LASER/STENT PLACEMENT Left 06/17/2021   Procedure: CYSTOSCOPY/URETEROSCOPY/HOLMIUM LASER/STENT EXCHANGE;  Surgeon: Francisca Redell BROCKS, MD;  Location: [facility];  Service: Urology;  Laterality: Left;   DIALYSIS/PERMA CATHETER INSERTION N/A 02/24/2021   Procedure: DIALYSIS/PERMA CATHETER INSERTION;  Surgeon: Marea Selinda RAMAN, MD;  Location: [facility];  Service: Cardiovascular;  Laterality: N/A;   DIALYSIS/PERMA CATHETER INSERTION N/A 12/16/2021   Procedure: DIALYSIS/PERMA CATHETER INSERTION;  Surgeon: Jama Cordella MATSU, MD;  Location: [facility];  Service: Cardiovascular;  Laterality: N/A;   DIALYSIS/PERMA CATHETER REMOVAL N/A 09/04/2022   Procedure: DIALYSIS/PERMA CATHETER REMOVAL;  Surgeon: Marea Selinda RAMAN, MD;  Location: [facility];  Service: Cardiovascular;  Laterality: N/A;   PERIPHERAL VASCULAR THROMBECTOMY Left 09/28/2021   Procedure: PERIPHERAL VASCULAR  THROMBECTOMY;  Surgeon: Jama Cordella MATSU, MD;  Location: [facility];  Service: Cardiovascular;  Laterality: Left;   REVISON OF ARTERIOVENOUS FISTULA Left 04/28/2022   Procedure: REVISON OF ARTERIOVENOUS FISTULA (BRACHIAL CEPHALIC);  Surgeon: Jama Cordella MATSU, MD;  Location: [facility];  Service: Vascular;  Laterality: Left;    Family History  Problem Relation Age of Onset   Diabetes Father    Hypertension Father    Parkinson's disease Father    Hypertension Mother    Rheum arthritis Mother    Atrial fibrillation Mother    Heart failure Mother    Heart attack Mother        died in her mid-70's.   Colon cancer Cousin 54   Breast cancer Neg Hx     Allergies  Allergen Reactions   Spironolactone      Hyperkalemia  Pt unaware of this allergy       Latest Ref Rng & Units 07/14/2023    4:25 AM 07/13/2023    6:19 AM 07/12/2023    4:58 AM  CBC  WBC 4.0 - 10.5 K/uL 13.6  11.2  12.1   Hemoglobin 12.0 - 15.0 g/dL 88.4  88.3  88.7   Hematocrit 36.0 - 46.0 % 35.4  36.9  34.8   Platelets 150 - 400 K/uL 205  173  171       CMP     Component Value Date/Time   NA 131 (L) 07/14/2023 0845   NA 144 02/27/2020 0931   NA 141 03/09/2014 1533   K 4.2 07/14/2023 0845   K 4.2 03/09/2014 1533   CL 93 (L) 07/14/2023 0845   CL 101 03/09/2014 1533   CO2 24 07/14/2023 0845   CO2 30 03/09/2014 1533   GLUCOSE 169 (H) 07/14/2023 0845   GLUCOSE 95 03/09/2014 1533   BUN 65 (H) 07/14/2023 0845   BUN 40 (H) 02/27/2020 0931   BUN 35 (H) 03/09/2014 1533   CREATININE 8.06 (H) 07/14/2023 0845   CREATININE 1.50 (H) 03/09/2014 1533   CALCIUM  8.3 (L) 07/14/2023 0845   CALCIUM  8.8 03/09/2014 1533   PROT 6.9 03/02/2023 1054   PROT 7.3 03/09/2014 1533   ALBUMIN  3.0 (L) 07/14/2023 0845   ALBUMIN  4.2 03/02/2023 1054   ALBUMIN  3.5 03/09/2014 1533   AST 24 03/02/2023 1054   AST 16 03/09/2014 1533   ALT 31 03/02/2023 1054   ALT 23 03/09/2014 1533   ALKPHOS 206 (H) 03/02/2023 1054   ALKPHOS  113 03/09/2014 1533   BILITOT 0.6 03/02/2023 1054   BILITOT 0.3 03/09/2014 1533   GFRNONAA 5 (L) 07/14/2023 0845   GFRNONAA 39 (L) 03/09/2014 1533   GFRNONAA 55 (L) 07/31/2013 1521     No results found.     Assessment & Plan:   1. ESRD (end stage renal disease) (HCC) (Primary) Recommend:  The patient is experiencing increasing problems with their dialysis access.  Patient should have a fistulagram with the intention for intervention.  The intention for intervention is to restore appropriate flow and prevent thrombosis and possible loss of the access.  As well as improve the quality of dialysis therapy.  The risks, benefits and alternative therapies were reviewed in detail with the patient.  All questions were answered.  The patient agrees to proceed with angio/intervention.    The patient will follow up with me in the office after the procedure.   2. Controlled type 2 diabetes mellitus with hyperglycemia, without long-term current use of insulin  (HCC) Continue hypoglycemic medications as already ordered, these medications have been reviewed and there are no changes at this time.  Hgb A1C to be monitored as already arranged by primary service  3. Dyslipidemia Continue statin as ordered and reviewed, no changes at this time   Current Outpatient Medications on File Prior to Visit  Medication Sig Dispense  Refill   acetaminophen  (TYLENOL ) 325 MG tablet Take 2 tablets (650 mg total) by mouth every 6 (six) hours as needed for mild pain (or Fever >/= 101).     albuterol  (VENTOLIN  HFA) 108 (90 Base) MCG/ACT inhaler Inhale 2 puffs into the lungs every 6 (six) hours as needed for wheezing or shortness of breath. 8 g 2   albuterol  (VENTOLIN  HFA) 108 (90 Base) MCG/ACT inhaler Inhale 2 puffs into the lungs every 6 (six) hours as needed for wheezing or shortness of breath. 6.7 g 2   apixaban  (ELIQUIS ) 5 MG TABS tablet Take 1 tablet (5 mg total) by mouth 2 (two) times daily. 180 tablet 1    aspirin  EC 81 MG tablet Take 1 tablet (81 mg total) by mouth daily. Swallow whole. 150 tablet 1   atorvastatin  (LIPITOR) 40 MG tablet Take 40 mg by mouth daily.     atorvastatin  (LIPITOR) 40 MG tablet Take 1 tablet (40 mg total) by mouth daily. 90 tablet 3   CRANBERRY PO Take 1 capsule by mouth daily.     Dulaglutide  (TRULICITY ) 3 MG/0.5ML SOAJ Inject 3 mg into the skin once a week. 2 mL 4   ezetimibe  (ZETIA ) 10 MG tablet Take 10 mg by mouth daily with supper.     ezetimibe  (ZETIA ) 10 MG tablet Take 1 tablet (10 mg total) by mouth daily. 90 tablet 3   ferric gluconate 250 mg in sodium chloride  0.9 % 250 mL Inject 250 mg into the vein Every Tuesday,Thursday,and Saturday with dialysis.     fluticasone -salmeterol (ADVAIR DISKUS) 100-50 MCG/ACT AEPB Inhale 1 puff into the lungs 2 (two) times daily. 60 each 6   gabapentin  (NEURONTIN ) 300 MG capsule Take 1 capsule (300 mg total) by mouth at bedtime. 30 capsule 0   Lancets (ONETOUCH DELICA PLUS LANCET30G) MISC      levothyroxine  (SYNTHROID ) 112 MCG tablet Take 1 tablet (112 mcg total) by mouth daily before breakfast. 30 tablet 0   melatonin 5 MG TABS Take 2 tablets (10 mg total) by mouth at bedtime as needed (sleep). 60 tablet 0   metoprolol  succinate (TOPROL -XL) 50 MG 24 hr tablet Take 1 tablet (50 mg total) by mouth at bedtime with or immediately following a meal 30 tablet 3   metoprolol  succinate (TOPROL -XL) 50 MG 24 hr tablet Take 1 tablet (50 mg total) by mouth at bedtime. Take with or immediately following a meal. 90 tablet 2   midodrine  (PROAMATINE ) 10 MG tablet Take 1 tablet (10 mg total) by mouth Every Tuesday,Thursday,and Saturday with dialysis. 30 tablet 0   midodrine  (PROAMATINE ) 10 MG tablet Take 2 tablets (20 mg total) by mouth with each dialysis treatment 120 tablet 4   multivitamin (RENA-VIT) TABS tablet Take 1 tablet by mouth daily.     ONETOUCH ULTRA test strip      polyethylene glycol powder (GLYCOLAX /MIRALAX ) 17 GM/SCOOP powder Take  17 g by mouth as needed for mild constipation or moderate constipation.     TRULICITY  1.5 MG/0.5ML SOPN Inject 1.5 mg into the skin every Monday.     VELPHORO 500 MG chewable tablet Chew 1,000 mg by mouth 3 (three) times daily.     cinacalcet  (SENSIPAR ) 30 MG tablet Take 30 mg by mouth daily.     No current facility-administered medications on file prior to visit.    There are no Patient Instructions on file for this visit. No follow-ups on file.   Wirt Hemmerich E Saryn Cherry, NP

## 2023-12-28 ENCOUNTER — Inpatient Hospital Stay: Admission: RE | Admit: 2023-12-28 | Source: Ambulatory Visit

## 2023-12-30 DIAGNOSIS — J9610 Chronic respiratory failure, unspecified whether with hypoxia or hypercapnia: Secondary | ICD-10-CM | POA: Diagnosis not present

## 2024-01-01 ENCOUNTER — Telehealth (INDEPENDENT_AMBULATORY_CARE_PROVIDER_SITE_OTHER): Payer: Self-pay

## 2024-01-01 ENCOUNTER — Encounter: Admission: RE | Payer: Self-pay | Source: Home / Self Care

## 2024-01-01 ENCOUNTER — Ambulatory Visit: Admission: RE | Admit: 2024-01-01 | Source: Home / Self Care | Admitting: Vascular Surgery

## 2024-01-01 DIAGNOSIS — N186 End stage renal disease: Secondary | ICD-10-CM

## 2024-01-01 SURGERY — A/V FISTULAGRAM
Anesthesia: General | Laterality: Left

## 2024-01-01 NOTE — Telephone Encounter (Signed)
 Spoke with the patient and she has been rescheduled to 01/09/24 with a 9:00 am arrival time to the [facility]. Pre-procedure instructions were discussed and will be sent to Mychart. We also talked about not taking her Trulicity  for 7 days as well.

## 2024-01-09 ENCOUNTER — Other Ambulatory Visit: Payer: Self-pay

## 2024-01-09 ENCOUNTER — Ambulatory Visit: Payer: Self-pay | Admitting: Anesthesiology

## 2024-01-09 ENCOUNTER — Encounter: Payer: Self-pay | Admitting: Vascular Surgery

## 2024-01-09 ENCOUNTER — Ambulatory Visit
Admission: RE | Admit: 2024-01-09 | Discharge: 2024-01-09 | Disposition: A | Discharge status: Home / Self Care | Attending: Vascular Surgery | Admitting: Vascular Surgery

## 2024-01-09 ENCOUNTER — Encounter
Admission: RE | Disposition: A | Discharge status: Home / Self Care | Payer: Self-pay | Source: Home / Self Care | Attending: Vascular Surgery

## 2024-01-09 DIAGNOSIS — Y832 Surgical operation with anastomosis, bypass or graft as the cause of abnormal reaction of the patient, or of later complication, without mention of misadventure at the time of the procedure: Secondary | ICD-10-CM | POA: Insufficient documentation

## 2024-01-09 DIAGNOSIS — E1165 Type 2 diabetes mellitus with hyperglycemia: Secondary | ICD-10-CM | POA: Insufficient documentation

## 2024-01-09 DIAGNOSIS — Z7985 Long-term (current) use of injectable non-insulin antidiabetic drugs: Secondary | ICD-10-CM | POA: Insufficient documentation

## 2024-01-09 DIAGNOSIS — E1122 Type 2 diabetes mellitus with diabetic chronic kidney disease: Secondary | ICD-10-CM | POA: Diagnosis not present

## 2024-01-09 DIAGNOSIS — N186 End stage renal disease: Secondary | ICD-10-CM | POA: Insufficient documentation

## 2024-01-09 DIAGNOSIS — I5032 Chronic diastolic (congestive) heart failure: Secondary | ICD-10-CM | POA: Diagnosis not present

## 2024-01-09 DIAGNOSIS — T82858A Stenosis of vascular prosthetic devices, implants and grafts, initial encounter: Secondary | ICD-10-CM | POA: Diagnosis not present

## 2024-01-09 DIAGNOSIS — I132 Hypertensive heart and chronic kidney disease with heart failure and with stage 5 chronic kidney disease, or end stage renal disease: Secondary | ICD-10-CM | POA: Insufficient documentation

## 2024-01-09 DIAGNOSIS — Z992 Dependence on renal dialysis: Secondary | ICD-10-CM | POA: Insufficient documentation

## 2024-01-09 DIAGNOSIS — E785 Hyperlipidemia, unspecified: Secondary | ICD-10-CM | POA: Insufficient documentation

## 2024-01-09 DIAGNOSIS — I5030 Unspecified diastolic (congestive) heart failure: Secondary | ICD-10-CM | POA: Diagnosis not present

## 2024-01-09 DIAGNOSIS — I871 Compression of vein: Secondary | ICD-10-CM | POA: Diagnosis not present

## 2024-01-09 HISTORY — PX: A/V FISTULAGRAM: CATH118298

## 2024-01-09 LAB — GLUCOSE, CAPILLARY
Glucose-Capillary: 156 mg/dL — ABNORMAL HIGH (ref 70–99)
Glucose-Capillary: 182 mg/dL — ABNORMAL HIGH (ref 70–99)

## 2024-01-09 LAB — POTASSIUM (ARMC VASCULAR LAB ONLY): Potassium (ARMC vascular lab): 3.5 mmol/L (ref 3.5–5.1)

## 2024-01-09 SURGERY — A/V FISTULAGRAM
Anesthesia: General | Laterality: Left

## 2024-01-09 MED ORDER — SUCCINYLCHOLINE CHLORIDE 200 MG/10ML IV SOSY
PREFILLED_SYRINGE | INTRAVENOUS | Status: DC | PRN
  Administered 2024-01-09: 100 mg via INTRAVENOUS

## 2024-01-09 MED ORDER — PROPOFOL 10 MG/ML IV BOLUS
INTRAVENOUS | Status: DC | PRN
Start: 2024-01-09 — End: 2024-01-09
  Administered 2024-01-09: 200 mg via INTRAVENOUS

## 2024-01-09 MED ORDER — LIDOCAINE HCL (PF) 2 % IJ SOLN
INTRAMUSCULAR | Status: AC
  Filled 2024-01-09: qty 15

## 2024-01-09 MED ORDER — GLYCOPYRROLATE 0.2 MG/ML IJ SOLN
INTRAMUSCULAR | Status: AC
  Filled 2024-01-09: qty 3

## 2024-01-09 MED ORDER — SUGAMMADEX SODIUM 200 MG/2ML IV SOLN
INTRAVENOUS | Status: DC | PRN
  Administered 2024-01-09: 300 mg via INTRAVENOUS

## 2024-01-09 MED ORDER — OXYCODONE HCL 5 MG/5ML PO SOLN: 5.0000 mg | Freq: Once | ORAL | Status: DC | PRN

## 2024-01-09 MED ORDER — FENTANYL CITRATE (PF) 100 MCG/2ML IJ SOLN
INTRAMUSCULAR | Status: DC | PRN
  Administered 2024-01-09: 100 ug via INTRAVENOUS

## 2024-01-09 MED ORDER — IODIXANOL 320 MG/ML IV SOLN
INTRAVENOUS | Status: DC | PRN
  Administered 2024-01-09: 20 mL

## 2024-01-09 MED ORDER — HYDROMORPHONE HCL 1 MG/ML IJ SOLN: 1.0000 mg | Freq: Once | INTRAMUSCULAR | Status: DC | PRN

## 2024-01-09 MED ORDER — HEPARIN SODIUM (PORCINE) 1000 UNIT/ML IJ SOLN
INTRAMUSCULAR | Status: AC
  Filled 2024-01-09: qty 20

## 2024-01-09 MED ORDER — METHYLPREDNISOLONE SODIUM SUCC 125 MG IJ SOLR: 125.0000 mg | Freq: Once | INTRAMUSCULAR | Status: DC | PRN

## 2024-01-09 MED ORDER — LIDOCAINE HCL (PF) 1 % IJ SOLN
INTRAMUSCULAR | Status: DC | PRN
  Administered 2024-01-09: 5 mL

## 2024-01-09 MED ORDER — PHENYLEPHRINE HCL-NACL 20-0.9 MG/250ML-% IV SOLN
INTRAVENOUS | Status: AC
  Filled 2024-01-09: qty 250

## 2024-01-09 MED ORDER — HEPARIN (PORCINE) IN NACL 1000-0.9 UT/500ML-% IV SOLN
INTRAVENOUS | Status: DC | PRN
  Administered 2024-01-09 (×2): 500 mL

## 2024-01-09 MED ORDER — DIPHENHYDRAMINE HCL 50 MG/ML IJ SOLN: 50.0000 mg | Freq: Once | INTRAMUSCULAR | Status: DC | PRN

## 2024-01-09 MED ORDER — ROCURONIUM BROMIDE 100 MG/10ML IV SOLN
INTRAVENOUS | Status: DC | PRN
  Administered 2024-01-09: 50 mg via INTRAVENOUS

## 2024-01-09 MED ORDER — ONDANSETRON HCL 4 MG/2ML IJ SOLN
INTRAMUSCULAR | Status: DC | PRN
Start: 2024-01-09 — End: 2024-01-09
  Administered 2024-01-09 (×2): 4 mg via INTRAVENOUS

## 2024-01-09 MED ORDER — PROPOFOL 1000 MG/100ML IV EMUL
INTRAVENOUS | Status: AC
  Filled 2024-01-09: qty 100

## 2024-01-09 MED ORDER — PHENYLEPHRINE 80 MCG/ML (10ML) SYRINGE FOR IV PUSH (FOR BLOOD PRESSURE SUPPORT)
PREFILLED_SYRINGE | INTRAVENOUS | Status: DC | PRN
  Administered 2024-01-09: 80 ug via INTRAVENOUS
  Administered 2024-01-09 (×2): 160 ug via INTRAVENOUS

## 2024-01-09 MED ORDER — CEFAZOLIN SODIUM-DEXTROSE 1-4 GM/50ML-% IV SOLN
INTRAVENOUS | Status: AC
  Filled 2024-01-09: qty 50

## 2024-01-09 MED ORDER — ONDANSETRON HCL 4 MG/2ML IJ SOLN: 4.0000 mg | Freq: Four times a day (QID) | INTRAMUSCULAR | Status: DC | PRN

## 2024-01-09 MED ORDER — SODIUM CHLORIDE 0.9 % IV SOLN: INTRAVENOUS | Status: DC

## 2024-01-09 MED ORDER — GLYCOPYRROLATE 0.2 MG/ML IJ SOLN
INTRAMUSCULAR | Status: DC | PRN
  Administered 2024-01-09: .2 mg via INTRAVENOUS

## 2024-01-09 MED ORDER — OXYCODONE HCL 5 MG PO TABS: 5.0000 mg | ORAL_TABLET | Freq: Once | ORAL | Status: DC | PRN

## 2024-01-09 MED ORDER — FENTANYL CITRATE (PF) 100 MCG/2ML IJ SOLN
INTRAMUSCULAR | Status: AC
  Filled 2024-01-09: qty 2

## 2024-01-09 MED ORDER — LIDOCAINE HCL (CARDIAC) PF 100 MG/5ML IV SOSY
PREFILLED_SYRINGE | INTRAVENOUS | Status: DC | PRN
  Administered 2024-01-09: 100 mg via INTRAVENOUS

## 2024-01-09 MED ORDER — FAMOTIDINE 20 MG PO TABS
40.0000 mg | ORAL_TABLET | Freq: Once | ORAL | Status: DC | PRN
Start: 2024-01-09 — End: 2024-01-09

## 2024-01-09 MED ORDER — VASOPRESSIN 20 UNIT/ML IV SOLN
INTRAVENOUS | Status: DC | PRN
  Administered 2024-01-09: 4 [IU] via INTRAVENOUS

## 2024-01-09 MED ORDER — MIDAZOLAM HCL 2 MG/ML PO SYRP: 8.0000 mg | ORAL_SOLUTION | Freq: Once | ORAL | Status: DC | PRN

## 2024-01-09 MED ORDER — ALBUTEROL SULFATE HFA 108 (90 BASE) MCG/ACT IN AERS
INHALATION_SPRAY | RESPIRATORY_TRACT | Status: DC | PRN
  Administered 2024-01-09 (×4): 2 via RESPIRATORY_TRACT

## 2024-01-09 MED ORDER — PHENYLEPHRINE HCL-NACL 20-0.9 MG/250ML-% IV SOLN
INTRAVENOUS | Status: DC | PRN
  Administered 2024-01-09: 25 ug/min via INTRAVENOUS

## 2024-01-09 MED ORDER — CEFAZOLIN SODIUM-DEXTROSE 1-4 GM/50ML-% IV SOLN
1.0000 g | INTRAVENOUS | Status: AC
  Administered 2024-01-09: 1 g via INTRAVENOUS

## 2024-01-09 MED ORDER — FENTANYL CITRATE (PF) 100 MCG/2ML IJ SOLN: 25.0000 ug | INTRAMUSCULAR | Status: DC | PRN

## 2024-01-09 MED ORDER — HEPARIN SODIUM (PORCINE) 1000 UNIT/ML IJ SOLN
INTRAMUSCULAR | Status: DC | PRN
  Administered 2024-01-09: 4000 [IU] via INTRAVENOUS

## 2024-01-09 SURGICAL SUPPLY — 12 items
BALLOON DORADO 10X40X80 (BALLOONS) IMPLANT
BALLOON DORADO 8X60X80 (BALLOONS) IMPLANT
COVER PROBE ULTRASOUND 5X96 (MISCELLANEOUS) IMPLANT
DEVICE PRESTO INFLATION (MISCELLANEOUS) IMPLANT
DRAPE BRACHIAL (DRAPES) IMPLANT
GOWN STRL REUS W/ TWL LRG LVL3 (GOWN DISPOSABLE) ×1 IMPLANT
NDL ENTRY 21GA 7CM ECHOTIP (NEEDLE) IMPLANT
PACK ANGIOGRAPHY (CUSTOM PROCEDURE TRAY) ×1 IMPLANT
SET INTRO CAPELLA COAXIAL (SET/KITS/TRAYS/PACK) IMPLANT
SHEATH BRITE TIP 6FRX5.5 (SHEATH) IMPLANT
SUT MNCRL AB 4-0 PS2 18 (SUTURE) IMPLANT
WIRE SUPRACORE 190CM (WIRE) IMPLANT

## 2024-01-09 NOTE — Anesthesia Postprocedure Evaluation (Signed)
 Anesthesia Post Note  Patient: Nicole Schmidt  Procedure(s) Performed: A/V Fistulagram (Left)  Patient location during evaluation: PACU Anesthesia Type: General Level of consciousness: awake and alert Pain management: pain level controlled Vital Signs Assessment: post-procedure vital signs reviewed and stable Respiratory status: spontaneous breathing, nonlabored ventilation, respiratory function stable and patient connected to nasal cannula oxygen  Cardiovascular status: blood pressure returned to baseline and stable Postop Assessment: no apparent nausea or vomiting Anesthetic complications: no   Encounter Notable Events  Notable Event Outcome Phase Comment  None  Intraprocedure      Last Vitals:  Vitals:   01/09/24 1045 01/09/24 1100  BP: 119/72 131/62  Pulse: (!) 114 (!) 109  Resp: 20 19  Temp:    SpO2: 94% 100%    Last Pain:  Vitals:   01/09/24 1100  TempSrc:   PainSc: 0-No pain                 Nicole Schmidt

## 2024-01-09 NOTE — Interval H&P Note (Signed)
 History and Physical Interval Note:  01/09/2024 9:04 AM  Nicole Schmidt  has presented today for surgery, with the diagnosis of L Arm Fistulagram   ANESTHESIA    End Stage Renal.  The various methods of treatment have been discussed with the patient and family. After consideration of risks, benefits and other options for treatment, the patient has consented to  Procedure(s): A/V Fistulagram (Left) as a surgical intervention.  The patient's history has been reviewed, patient examined, no change in status, stable for surgery.  I have reviewed the patient's chart and labs.  Questions were answered to the patient's satisfaction.     Cordella Shawl

## 2024-01-09 NOTE — Discharge Instructions (Signed)
 Shuntogram, Care After Refer to this sheet in the next few weeks. These instructions provide you with information on caring for yourself after your procedure. Your health care provider may also give you more specific instructions. Your treatment has been planned according to current medical practices, but problems sometimes occur. Call your health care provider if you have any problems or questions after your procedure. What can I expect after the procedure? After your procedure, it is typical to have the following:  A small amount of discomfort in the area where the catheters were placed.  A small amount of bruising around the fistula.  Sleepiness and fatigue.  Follow these instructions at home:  Rest at home for the day following your procedure.  Do not drive or operate heavy machinery while taking pain medicine.  Take medicines only as directed by your health care provider.  Do not take baths, swim, or use a hot tub until your health care provider approves. You may shower 24 hours after the procedure or as directed by your health care provider.  There are many different ways to close and cover an incision, including stitches, skin glue, and adhesive strips. Follow your health care provider's instructions on: ? Incision care. ? Bandage (dressing) changes and removal. ? Incision closure removal.  Monitor your dialysis fistula carefully. Contact a health care provider if:  You have drainage, redness, swelling, or pain at your catheter site.  You have a fever.  You have chills. Get help right away if:  You feel weak.  You have trouble balancing.  You have trouble moving your arms or legs.  You have problems with your speech or vision.  You can no longer feel a vibration or buzz when you put your fingers over your dialysis fistula.  The limb that was used for the procedure: ? Swells. ? Is painful. ? Is cold. ? Is discolored, such as blue or pale white. This  information is not intended to replace advice given to you by your health care provider. Make sure you discuss any questions you have with your health care provider. Document Released: 06/16/2013 Document Revised: 07/08/2015 Document Reviewed: 03/21/2013 Elsevier Interactive Patient Education  2018 [employer].

## 2024-01-09 NOTE — Anesthesia Procedure Notes (Signed)
 Procedure Name: Intubation Date/Time: 01/09/2024 9:32 AM  Performed by: Ledora Duncan, CRNAPre-anesthesia Checklist: Patient identified, Emergency Drugs available, Suction available and Patient being monitored Patient Re-evaluated:Patient Re-evaluated prior to induction Oxygen  Delivery Method: Circle system utilized Preoxygenation: Pre-oxygenation with 100% oxygen  Induction Type: IV induction Ventilation: Mask ventilation without difficulty Laryngoscope Size: McGrath and 3 Grade View: Grade I Tube type: Oral Tube size: 7.0 mm Number of attempts: 1 Airway Equipment and Method: Stylet Placement Confirmation: ETT inserted through vocal cords under direct vision, positive ETCO2 and breath sounds checked- equal and bilateral Secured at: 21 cm Tube secured with: Tape Dental Injury: Teeth and Oropharynx as per pre-operative assessment

## 2024-01-09 NOTE — Op Note (Signed)
 OPERATIVE NOTE   PROCEDURE: Contrast injection left brachiocephalic AV access Percutaneous transluminal angioplasty of the peripheral segment in 2 locations. Percutaneous transluminal angioplasty of the central segment in 1 location.  PRE-OPERATIVE DIAGNOSIS: Complication of dialysis access                                                       End Stage Renal Disease  POST-OPERATIVE DIAGNOSIS: same as above   SURGEON: Cordella JUDITHANN Shawl, M.D.  ANESTHESIA: General By LMA  ESTIMATED BLOOD LOSS: minimal  FINDING(S): Stricture of the AV graft  SPECIMEN(S):  None  CONTRAST: 20 cc  FLUOROSCOPY TIME: 3.5 minutes  INDICATIONS: Nicole Schmidt is a 58 y.o. female who  presents with malfunctioning left arm AV access.  The patient is scheduled for angiography with possible intervention of the AV access.  The patient is aware the risks include but are not limited to: bleeding, infection, thrombosis of the cannulated access, and possible anaphylactic reaction to the contrast.  The patient acknowledges if the access can not be salvaged a tunneled catheter will be needed and will be placed during this procedure.  The patient is aware of the risks of the procedure and elects to proceed with the angiogram and intervention.  DESCRIPTION: After full informed written consent was obtained, the patient was brought back to the Special Procedure suite and placed supine position.  Appropriate cardiopulmonary monitors were placed.  The left arm was prepped and draped in the standard fashion.  Appropriate timeout is called. The left arm was cannulated with a micropuncture needle.  Cannulation was performed with ultrasound guidance. Ultrasound was placed in a sterile sleeve, the AV access was interrogated and noted to be echolucent and compressible indicating patency. Image was recorded for the permanent record. The puncture is performed under continuous ultrasound visualization.   The microwire was advanced and  the needle was exchanged for  a microsheath.  The J-wire was then advanced and a 6 Fr sheath inserted.  Hand injections were completed to image the access from the arterial anastomosis through the entire access.  The central venous structures were also imaged by hand injections.  Based on the images,  4000 units of heparin  was given and a wire was negotiated through the strictures within the peripheral and central venous portion of the graft.  An 10 mm x 40 mm Dorado balloon was used initially.  The balloon was negotiated into the subclavian at the supra clavian cephalic confluence which had previously been stented and now demonstrated a greater than 70% in-stent restenosis.  Inflation was to 14 atm for 1 minute.  The 10 mm balloon was then repositioned across the midportion of the AV access again and the previously stented segment.  Here 2 separate inflations were required both of which were to 26 atm for 1 minute.  Lastly an 8 mm x 40 mm balloon was advanced across a segment still within the peripheral segment and inflated to 16 atm for 1 minute.  Follow-up imaging demonstrates complete resolution of the strictures with rapid flow of contrast through the graft with less than 10% residual stenosis, the central venous anatomy is also well treated with less than 10% residual stenosis.  A 4-0 Monocryl purse-string suture was sewn around the sheath.  The sheath was removed and light pressure was applied.  A sterile bandage was applied to the puncture site.    COMPLICATIONS: None  CONDITION: [name], M.D [facility] Office: [phone]  01/09/2024 10:18 AM

## 2024-01-09 NOTE — Transfer of Care (Signed)
 Immediate Anesthesia Transfer of Care Note  Patient: Nicole Schmidt  Procedure(s) Performed: A/V Fistulagram (Left)  Patient Location: PACU  Anesthesia Type:General  Level of Consciousness: awake, drowsy, and patient cooperative  Airway & Oxygen  Therapy: Patient Spontanous Breathing and Patient connected to face mask oxygen   Post-op Assessment: Report given to RN and Post -op Vital signs reviewed and stable  Post vital signs: Reviewed and stable  Last Vitals:  Vitals Value Taken Time  BP 118/62 01/09/24 10:32  Temp    Pulse 116 01/09/24 10:39  Resp 25 01/09/24 10:39  SpO2 95 % 01/09/24 10:39  Vitals shown include unfiled device data.  Last Pain:  Vitals:   01/09/24 0908  TempSrc: Temporal  PainSc: 0-No pain         Complications:  Encounter Notable Events  Notable Event Outcome Phase Comment  None  Intraprocedure

## 2024-01-09 NOTE — Anesthesia Preprocedure Evaluation (Signed)
 Anesthesia Evaluation  Patient identified by MRN, date of birth, ID band Patient awake    Reviewed: Allergy & Precautions, NPO status , Patient's Chart, lab work & pertinent test results  History of Anesthesia Complications (+) DIFFICULT AIRWAY and history of anesthetic complications  Airway Mallampati: III  TM Distance: >3 FB Neck ROM: full    Dental  (+) Teeth Intact   Pulmonary neg pulmonary ROS, sleep apnea and Continuous Positive Airway Pressure Ventilation , COPD   Pulmonary exam normal  + decreased breath sounds      Cardiovascular Exercise Tolerance: Poor hypertension, Pt. on medications +CHF and + DOE  negative cardio ROS Normal cardiovascular exam Rhythm:Regular Rate:Normal     Neuro/Psych negative neurological ROS  negative psych ROS   GI/Hepatic negative GI ROS, Neg liver ROS,,,  Endo/Other  negative endocrine ROSdiabetes, Type 2, Oral Hypoglycemic AgentsHypothyroidism  Class 4 obesity  Renal/GU ESRFRenal disease     Musculoskeletal   Abdominal  (+) + obese  Peds negative pediatric ROS (+)  Hematology negative hematology ROS (+) Blood dyscrasia, anemia   Anesthesia Other Findings Past Medical History: No date: (HFpEF) heart failure with preserved ejection fraction (HCC)     Comment:  a. 10/2018 Echo: EF 60-65%, diast dysfxn, RVSP 50.7 mmHg;              b. 01/2020 Echo: EF 65-70%, G2DD; c. 01/2021 Echo: EF               60-65%, no rwma, mod LVH. Nl RV fxn, mildly enlarged RV.               Mod BAE. Triv MR. d. 05/2021 Echo: EF 55-60; no RWMAs,               mild LVH, mild TR. 02/09/2020: Acquired hypothyroidism No date: Adenoma of left adrenal gland No date: Anemia No date: Aortic atherosclerosis (HCC) No date: Arthritis No date: Breast cancer of upper-inner quadrant of right female breast  (HCC)     Comment:  Right breast invasive CA and DCIS , 7 mm T1,N0,M0. Er/PR              pos, her 2  negative.  Margins 1 mm. No date: COPD (chronic obstructive pulmonary disease) (HCC) 02/09/2020: Diabetes mellitus type 2 in obese 09/28/2021: Difficult intubation     Comment:  a.) mallampati III-IV; BMI >55 kg/m No date: Diverticulosis 02/09/2020: Dyslipidemia 03/20/2021: ESBL (extended spectrum beta-lactamase) producing  bacteria infection     Comment:  a. urine culture (+) for ESBL producing E.coli and P.               mirabilis No date: ESRD (end stage renal disease) on dialysis (HCC)     Comment:  a. T-Th-Sat HD initiated 02/2021 No date: Essential hypertension No date: Gout No date: History of kidney stones No date: Long term current use of anticoagulant     Comment:  a.) apixaban  No date: Menopause     Comment:  age 48 No date: Morbid obesity (HCC) No date: OSA treated with BiPAP     Comment:  a. on nocturnal PAP therapy; Trilogy 100 ventilator No date: Permanent atrial fibrillation (HCC)     Comment:  a. Dx 10/2018 in setting of PNA. b. CHA2DS2-VASc = 7               (sex, HFpEF, HTN, PE x2, aortic plaque, T2DM); c. s/p  successful DCCV 11/2018. d. subsequent recurrent Afib;               rate/rhythm maintained on oral metoprolol  succinate;               chronically anticoagulated using standard dose apixaban . No date: Personal history of radiation therapy 10/2014: Pulmonary embolism (HCC)     Comment:  a. on chronic apixaban  No date: Restrictive lung disease 10/24/2021: Shingles No date: Sleep difficulties     Comment:  a.) takes melatonin No date: Thyroid  goiter  Past Surgical History: 09/02/2021: A/V FISTULAGRAM; Left     Comment:  Procedure: A/V Fistulagram;  Surgeon: Jama Cordella MATSU, MD;  Location: [facility];  Service:               Cardiovascular;  Laterality: Left; 06/27/2022: A/V FISTULAGRAM; Left     Comment:  Procedure: A/V Fistulagram;  Surgeon: Jama Cordella MATSU, MD;  Location: [facility];  Service:               Cardiovascular;  Laterality: Left; 01/05/2023: A/V FISTULAGRAM; Left     Comment:  Procedure: A/V Fistulagram;  Surgeon: Jama Cordella MATSU, MD;  Location: [facility];  Service:               Cardiovascular;  Laterality: Left; 07/22/2021: AV FISTULA PLACEMENT; Left     Comment:  Procedure: ARTERIOVENOUS (AV) FISTULA CREATION               (RADIALCEPHALIC);  Surgeon: Jama Cordella MATSU, MD;                Location: [facility];  Service: Vascular;  Laterality:               Left; 12/09/2021: AV FISTULA PLACEMENT; Left     Comment:  Procedure: ARTERIOVENOUS (AV) FISTULA CREATION (BRACHIAL              CEPHALIC);  Surgeon: Jama Cordella MATSU, MD;  Location:               [facility];  Service: Vascular;  Laterality: Left; 2014: BREAST BIOPSY; Right     Comment:  breast ca 07/19/2012: BREAST EXCISIONAL BIOPSY; Right     Comment:  breast ca 2014: BREAST SURGERY; Right     Comment:  with sentinel node bx subareaolar duct excision 11/28/2018: CARDIOVERSION; N/A     Comment:  Procedure: CARDIOVERSION;  Surgeon: Perla Evalene PARAS,               MD;  Location: [facility];  Service: Cardiovascular;                Laterality: N/A; 01/30/2017: COLONOSCOPY WITH PROPOFOL ; N/A     Comment:  Procedure: COLONOSCOPY WITH PROPOFOL ;  Surgeon: Dellie Louanne MATSU, MD;  Location: [facility];  Service:               Endoscopy;  Laterality: N/A; 06/17/2021: CYSTOSCOPY W/ RETROGRADES     Comment:  Procedure: CYSTOSCOPY WITH RETROGRADE PYELOGRAM;                Surgeon: Francisca Redell BROCKS,  MD;  Location: [facility];                Service: Urology;; 02/14/2021: CYSTOSCOPY W/ URETERAL STENT PLACEMENT; Left     Comment:  Procedure: CYSTOSCOPY WITH RETROGRADE PYELOGRAM/URETERAL              STENT PLACEMENT;  Surgeon: Francisca Redell BROCKS, MD;                Location: [facility];  Service: Urology;  Laterality: Left; 06/17/2021: CYSTOSCOPY/URETEROSCOPY/HOLMIUM  LASER/STENT PLACEMENT; Left     Comment:  Procedure: CYSTOSCOPY/URETEROSCOPY/HOLMIUM LASER/STENT               EXCHANGE;  Surgeon: Francisca Redell BROCKS, MD;  Location: [facility];  Service: Urology;  Laterality: Left; 02/24/2021: DIALYSIS/PERMA CATHETER INSERTION; N/A     Comment:  Procedure: DIALYSIS/PERMA CATHETER INSERTION;  Surgeon:               Marea Selinda RAMAN, MD;  Location: [facility];                Service: Cardiovascular;  Laterality: N/A; 12/16/2021: DIALYSIS/PERMA CATHETER INSERTION; N/A     Comment:  Procedure: DIALYSIS/PERMA CATHETER INSERTION;  Surgeon:               Jama Cordella MATSU, MD;  Location: [facility];               Service: Cardiovascular;  Laterality: N/A; 09/04/2022: DIALYSIS/PERMA CATHETER REMOVAL; N/A     Comment:  Procedure: DIALYSIS/PERMA CATHETER REMOVAL;  Surgeon:               Marea Selinda RAMAN, MD;  Location: [facility];                Service: Cardiovascular;  Laterality: N/A; 09/28/2021: PERIPHERAL VASCULAR THROMBECTOMY; Left     Comment:  Procedure: PERIPHERAL VASCULAR THROMBECTOMY;  Surgeon:               Jama Cordella MATSU, MD;  Location: [facility];               Service: Cardiovascular;  Laterality: Left; 04/28/2022: REVISON OF ARTERIOVENOUS FISTULA; Left     Comment:  Procedure: REVISON OF ARTERIOVENOUS FISTULA (BRACHIAL               CEPHALIC);  Surgeon: Jama Cordella MATSU, MD;  Location:               [facility];  Service: Vascular;  Laterality: Left;  BMI    Body Mass Index: 67.08 kg/m      Reproductive/Obstetrics negative OB ROS                              Anesthesia Physical Anesthesia Plan  ASA: 4  Anesthesia Plan: General   Post-op Pain Management:    Induction: Intravenous  PONV Risk Score and Plan: 1 and Ondansetron  and Dexamethasone   Airway Management Planned: Oral ETT  Additional Equipment:   Intra-op Plan:   Post-operative Plan: Extubation in  OR  Informed Consent: I have reviewed the patients History and Physical, chart, labs and discussed the procedure including the risks, benefits and alternatives for the proposed anesthesia with the patient or authorized representative who has indicated his/her understanding and acceptance.     Dental Advisory Given  Plan Discussed  with: CRNA  Anesthesia Plan Comments:         Anesthesia Quick Evaluation

## 2024-01-11 ENCOUNTER — Other Ambulatory Visit (HOSPITAL_COMMUNITY): Payer: Self-pay

## 2024-01-13 DIAGNOSIS — N186 End stage renal disease: Secondary | ICD-10-CM | POA: Diagnosis not present

## 2024-01-13 DIAGNOSIS — Z992 Dependence on renal dialysis: Secondary | ICD-10-CM | POA: Diagnosis not present

## 2024-01-14 ENCOUNTER — Inpatient Hospital Stay: Payer: BC Managed Care – PPO | Attending: Oncology

## 2024-01-14 ENCOUNTER — Encounter: Payer: Self-pay | Admitting: Oncology

## 2024-01-14 ENCOUNTER — Inpatient Hospital Stay: Payer: BC Managed Care – PPO | Admitting: Oncology

## 2024-01-14 VITALS — BP 125/62 | HR 98 | Temp 98.8°F | Resp 19 | Ht 61.0 in | Wt 354.0 lb

## 2024-01-14 DIAGNOSIS — Z08 Encounter for follow-up examination after completed treatment for malignant neoplasm: Secondary | ICD-10-CM

## 2024-01-14 DIAGNOSIS — E1122 Type 2 diabetes mellitus with diabetic chronic kidney disease: Secondary | ICD-10-CM | POA: Insufficient documentation

## 2024-01-14 DIAGNOSIS — Z800 Family history of malignant neoplasm of digestive organs: Secondary | ICD-10-CM | POA: Diagnosis not present

## 2024-01-14 DIAGNOSIS — Z86711 Personal history of pulmonary embolism: Secondary | ICD-10-CM | POA: Insufficient documentation

## 2024-01-14 DIAGNOSIS — Z7901 Long term (current) use of anticoagulants: Secondary | ICD-10-CM | POA: Insufficient documentation

## 2024-01-14 DIAGNOSIS — Z79890 Hormone replacement therapy (postmenopausal): Secondary | ICD-10-CM | POA: Insufficient documentation

## 2024-01-14 DIAGNOSIS — R5383 Other fatigue: Secondary | ICD-10-CM | POA: Insufficient documentation

## 2024-01-14 DIAGNOSIS — Z853 Personal history of malignant neoplasm of breast: Secondary | ICD-10-CM

## 2024-01-14 DIAGNOSIS — E039 Hypothyroidism, unspecified: Secondary | ICD-10-CM | POA: Diagnosis not present

## 2024-01-14 DIAGNOSIS — Z923 Personal history of irradiation: Secondary | ICD-10-CM | POA: Diagnosis not present

## 2024-01-14 DIAGNOSIS — Z992 Dependence on renal dialysis: Secondary | ICD-10-CM | POA: Insufficient documentation

## 2024-01-14 LAB — BASIC METABOLIC PANEL WITH GFR
Anion gap: 13 (ref 5–15)
BUN: 36 mg/dL — ABNORMAL HIGH (ref 6–20)
CO2: 28 mmol/L (ref 22–32)
Calcium: 9.1 mg/dL (ref 8.9–10.3)
Chloride: 97 mmol/L — ABNORMAL LOW (ref 98–111)
Creatinine, Ser: 7.49 mg/dL (ref 0.44–1.00)
GFR, Estimated: 6 mL/min — ABNORMAL LOW (ref >60)
Glucose, Bld: 158 mg/dL — ABNORMAL HIGH (ref 70–99)
Potassium: 3.7 mmol/L (ref 3.5–5.1)
Sodium: 138 mmol/L (ref 135–145)

## 2024-01-14 LAB — CBC
HCT: 39.7 % (ref 36.0–46.0)
Hemoglobin: 12.5 g/dL (ref 12.0–15.0)
MCH: 36.3 pg — ABNORMAL HIGH (ref 26.0–34.0)
MCHC: 31.5 g/dL (ref 30.0–36.0)
MCV: 115.4 fL — ABNORMAL HIGH (ref 80.0–100.0)
Platelets: 152 K/uL (ref 150–400)
RBC: 3.44 MIL/uL — ABNORMAL LOW (ref 3.87–5.11)
RDW: 15.7 % — ABNORMAL HIGH (ref 11.5–15.5)
WBC: 8.7 K/uL (ref 4.0–10.5)
nRBC: 0 % (ref 0.0–0.2)

## 2024-01-14 NOTE — Progress Notes (Signed)
 Patient had a MAMMO in October & recently had a procedure last week. No new or acute concerns at this time.

## 2024-01-14 NOTE — Progress Notes (Signed)
 Hematology/Oncology Consult note [facility]  [phone] [phone]  Patient Care Team: Epifanio Alm SQUIBB, MD as PCP - General (Infectious Diseases) Darron Deatrice LABOR, MD as PCP - Cardiology (Cardiology) Dellie Louanne MATSU, MD (General Surgery) Donette Ellouise LABOR, FNP as Nurse Practitioner (Family Medicine) Lateef, Munsoor, MD as Consulting Physician (Nephrology) Melanee Annah BROCKS, MD as Consulting Physician (Hematology and Oncology)   Name of the patient: Nicole Schmidt  [id number]  Sep 16, 1965   Date of visit: 01/14/24  Diagnosis-  history of pulmonary embolism on Eliquis   History of breast cancer  Chief complaint/ Reason for visit-routine follow-up of history of pulmonary embolism on Eliquis   Heme/Onc history:  Patient is a 58 year old female with history of 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  She is s/p surgery adjuvant radiation treatment and was on tamoxifen  until September 2016 when she developed submassive pulmonary embolism.  She was switched to Femara  and she completed 5 years of hormone therapy in June 2020.  Patient has been Eliquis  for her pulmonary embolism since then.  Hypercoagulable work-up was negative.   Interval history-patient is currently on dialysis.  No recent hospitalizations.  She is tolerating Eliquis  well without any significant side effects.  Denies any breast concerns  ECOG PS- 2 Pain scale- 0   Review of systems- Review of Systems  Constitutional:  Positive for malaise/fatigue. Negative for chills, fever and weight loss.  HENT:  Negative for congestion, ear discharge and nosebleeds.   Eyes:  Negative for blurred vision.  Respiratory:  Negative for cough, hemoptysis, sputum production, shortness of breath and wheezing.   Cardiovascular:  Negative for chest pain, palpitations, orthopnea and claudication.  Gastrointestinal:  Negative for abdominal pain, blood in stool,  constipation, diarrhea, heartburn, melena, nausea and vomiting.  Genitourinary:  Negative for dysuria, flank pain, frequency, hematuria and urgency.  Musculoskeletal:  Negative for back pain, joint pain and myalgias.  Skin:  Negative for rash.  Neurological:  Negative for dizziness, tingling, focal weakness, seizures, weakness and headaches.  Endo/Heme/Allergies:  Does not bruise/bleed easily.  Psychiatric/Behavioral:  Negative for depression and suicidal ideas. The patient does not have insomnia.       Allergies  Allergen Reactions   Spironolactone      Hyperkalemia  Pt unaware of this allergy     Past Medical History:  Diagnosis Date   (HFpEF) heart failure with preserved ejection fraction (HCC)    a. 10/2018 Echo: EF 60-65%, diast dysfxn, RVSP 50.7 mmHg; b. 01/2020 Echo: EF 65-70%, G2DD; c. 01/2021 Echo: EF 60-65%, no rwma, mod LVH. Nl RV fxn, mildly enlarged RV. Mod BAE. Triv MR. d. 05/2021 Echo: EF 55-60; no RWMAs, mild LVH, mild TR; e. 06/2023 Echo: EF 55-60%, no rwma, GrI DD, mod elev PASP, mildly dil LA, no signif valvular dzs.   Acquired hypothyroidism 02/09/2020   Adenoma of left adrenal gland    Anemia    Aortic atherosclerosis    Arthritis    Breast cancer of upper-inner quadrant of right female breast (HCC)    Right breast invasive CA and DCIS , 7 mm T1,N0,M0. Er/PR pos, her 2 negative.  Margins 1 mm.   COPD (chronic obstructive pulmonary disease) (HCC)    Diabetes mellitus type 2 in obese 02/09/2020   Difficult intubation 09/28/2021   a.) mallampati III-IV; BMI >55 kg/m   Diverticulosis    Dyslipidemia 02/09/2020   ESBL (extended spectrum beta-lactamase) producing bacteria infection 03/20/2021   a.  urine culture (+) for ESBL producing E.coli and P. mirabilis   ESRD (end stage renal disease) on dialysis (HCC)    a. T-Th-Sat HD initiated 02/2021   Essential hypertension    Gout    History of kidney stones    Long term current use of anticoagulant    a.) apixaban     Menopause    age 51   Morbid obesity (HCC)    OSA treated with BiPAP    a. on nocturnal PAP therapy; Trilogy 100 ventilator   Permanent atrial fibrillation (HCC)    a. Dx 10/2018 in setting of PNA. b. CHA2DS2-VASc = 7 (sex, HFpEF, HTN, PE x2, aortic plaque, T2DM); c. s/p successful DCCV 11/2018. d. subsequent recurrent Afib; rate/rhythm maintained on oral metoprolol  succinate; chronically anticoagulated using standard dose apixaban .   Personal history of radiation therapy    Pulmonary embolism (HCC) 10/2014   a. on chronic apixaban    Restrictive lung disease    Shingles 10/24/2021   Sleep difficulties    a.) takes melatonin   Thyroid  goiter      Past Surgical History:  Procedure Laterality Date   A/V FISTULAGRAM Left 09/02/2021   Procedure: A/V Fistulagram;  Surgeon: Jama Cordella MATSU, MD;  Location: [facility];  Service: Cardiovascular;  Laterality: Left;   A/V FISTULAGRAM Left 06/27/2022   Procedure: A/V Fistulagram;  Surgeon: Jama Cordella MATSU, MD;  Location: [facility];  Service: Cardiovascular;  Laterality: Left;   A/V FISTULAGRAM Left 01/05/2023   Procedure: A/V Fistulagram;  Surgeon: Jama Cordella MATSU, MD;  Location: [facility];  Service: Cardiovascular;  Laterality: Left;   A/V FISTULAGRAM Left 07/06/2023   Procedure: A/V Fistulagram;  Surgeon: Jama Cordella MATSU, MD;  Location: [facility];  Service: Cardiovascular;  Laterality: Left;   A/V FISTULAGRAM Left 01/09/2024   Procedure: A/V Fistulagram;  Surgeon: Jama Cordella MATSU, MD;  Location: [facility];  Service: Cardiovascular;  Laterality: Left;   AV FISTULA PLACEMENT Left 07/22/2021   Procedure: ARTERIOVENOUS (AV) FISTULA CREATION (RADIALCEPHALIC);  Surgeon: Jama Cordella MATSU, MD;  Location: [facility];  Service: Vascular;  Laterality: Left;   AV FISTULA PLACEMENT Left 12/09/2021   Procedure: ARTERIOVENOUS (AV) FISTULA CREATION (BRACHIAL CEPHALIC);  Surgeon: Jama Cordella MATSU, MD;  Location: [facility];  Service: Vascular;  Laterality: Left;   BREAST BIOPSY Right 2014   breast ca   BREAST EXCISIONAL BIOPSY Right 07/19/2012   breast ca   BREAST SURGERY Right 2014   with sentinel node bx subareaolar duct excision   CARDIOVERSION N/A 11/28/2018   Procedure: CARDIOVERSION;  Surgeon: Perla Evalene PARAS, MD;  Location: [facility];  Service: Cardiovascular;  Laterality: N/A;   COLONOSCOPY WITH PROPOFOL  N/A 01/30/2017   Procedure: COLONOSCOPY WITH PROPOFOL ;  Surgeon: Dellie Louanne MATSU, MD;  Location: [facility];  Service: Endoscopy;  Laterality: N/A;   CYSTOSCOPY W/ RETROGRADES  06/17/2021   Procedure: CYSTOSCOPY WITH RETROGRADE PYELOGRAM;  Surgeon: Francisca Redell BROCKS, MD;  Location: [facility];  Service: Urology;;   CYSTOSCOPY W/ URETERAL STENT PLACEMENT Left 02/14/2021   Procedure: CYSTOSCOPY WITH RETROGRADE PYELOGRAM/URETERAL STENT PLACEMENT;  Surgeon: Francisca Redell BROCKS, MD;  Location: [facility];  Service: Urology;  Laterality: Left;   CYSTOSCOPY/URETEROSCOPY/HOLMIUM LASER/STENT PLACEMENT Left 06/17/2021   Procedure: CYSTOSCOPY/URETEROSCOPY/HOLMIUM LASER/STENT EXCHANGE;  Surgeon: Francisca Redell BROCKS, MD;  Location: [facility];  Service: Urology;  Laterality: Left;   DIALYSIS/PERMA CATHETER INSERTION N/A 02/24/2021   Procedure: DIALYSIS/PERMA CATHETER INSERTION;  Surgeon: Marea Mayo  S, MD;  Location: [facility];  Service: Cardiovascular;  Laterality: N/A;   DIALYSIS/PERMA CATHETER INSERTION N/A 12/16/2021   Procedure: DIALYSIS/PERMA CATHETER INSERTION;  Surgeon: Jama Cordella MATSU, MD;  Location: [facility];  Service: Cardiovascular;  Laterality: N/A;   DIALYSIS/PERMA CATHETER REMOVAL N/A 09/04/2022   Procedure: DIALYSIS/PERMA CATHETER REMOVAL;  Surgeon: Marea Selinda RAMAN, MD;  Location: [facility];  Service: Cardiovascular;  Laterality: N/A;   PERIPHERAL VASCULAR THROMBECTOMY Left 09/28/2021   Procedure: PERIPHERAL VASCULAR THROMBECTOMY;  Surgeon: Jama Cordella MATSU, MD;  Location: [facility];  Service: Cardiovascular;  Laterality: Left;   REVISON OF ARTERIOVENOUS FISTULA Left 04/28/2022   Procedure: REVISON OF ARTERIOVENOUS FISTULA (BRACHIAL CEPHALIC);  Surgeon: Jama Cordella MATSU, MD;  Location: [facility];  Service: Vascular;  Laterality: Left;    Social History   Socioeconomic History   Marital status: Single    Spouse name: Not on file   Number of children: Not on file   Years of education: Not on file   Highest education level: Not on file  Occupational History   Occupation: retired  Tobacco Use   Smoking status: Never    Passive exposure: Never   Smokeless tobacco: Never  Vaping Use   Vaping status: Never Used  Substance and Sexual Activity   Alcohol use: No    Alcohol/week: 0.0 standard drinks of alcohol   Drug use: No   Sexual activity: Not on file  Other Topics Concern   Not on file  Social History Narrative   Lives in [place] by herself and retired.    Social Drivers of Corporate Investment Banker Strain: Low Risk  (12/22/2022)   Received from [facility]   Overall Financial Resource Strain (CARDIA)    Difficulty of Paying Living Expenses: Not hard at all  Food Insecurity: Patient Declined (07/10/2023)   Hunger Vital Sign    Worried About Running Out of Food in the Last Year: Patient declined    Ran Out of Food in the Last Year: Patient declined  Transportation Needs: Patient Declined (07/10/2023)   PRAPARE - Administrator, Civil Service (Medical): Patient declined    Lack of Transportation (Non-Medical): Patient declined  Physical Activity: Unknown (11/15/2017)   Exercise Vital Sign    Days of Exercise per Week: Patient declined    Minutes of Exercise per Session: Patient declined  Stress: Unknown (11/15/2017)   [employer] of Occupational Health - Occupational Stress Questionnaire    Feeling of Stress : Patient declined  Social Connections: Patient Declined  (07/10/2023)   Social Connection and Isolation Panel    Frequency of Communication with Friends and Family: Patient declined    Frequency of Social Gatherings with Friends and Family: Patient declined    Attends Religious Services: Patient declined    Database Administrator or Organizations: Patient declined    Attends Banker Meetings: Patient declined    Marital Status: Patient declined  Intimate Partner Violence: Patient Declined (07/10/2023)   Humiliation, Afraid, Rape, and Kick questionnaire    Fear of Current or Ex-Partner: Patient declined    Emotionally Abused: Patient declined    Physically Abused: Patient declined    Sexually Abused: Patient declined    Family History  Problem Relation Age of Onset   Diabetes Father    Hypertension Father    Parkinson's disease Father    Hypertension Mother    Rheum arthritis Mother  Atrial fibrillation Mother    Heart failure Mother    Heart attack Mother        died in her mid-70's.   Colon cancer Cousin 54   Breast cancer Neg Hx      Current Outpatient Medications:    acetaminophen  (TYLENOL ) 325 MG tablet, Take 2 tablets (650 mg total) by mouth every 6 (six) hours as needed for mild pain (or Fever >/= 101)., Disp: , Rfl:    albuterol  (VENTOLIN  HFA) 108 (90 Base) MCG/ACT inhaler, Inhale 2 puffs into the lungs every 6 (six) hours as needed for wheezing or shortness of breath., Disp: 8 g, Rfl: 2   albuterol  (VENTOLIN  HFA) 108 (90 Base) MCG/ACT inhaler, Inhale 2 puffs into the lungs every 6 (six) hours as needed for wheezing or shortness of breath., Disp: 6.7 g, Rfl: 2   apixaban  (ELIQUIS ) 5 MG TABS tablet, Take 1 tablet (5 mg total) by mouth 2 (two) times daily., Disp: 180 tablet, Rfl: 1   atorvastatin  (LIPITOR) 40 MG tablet, Take 40 mg by mouth daily., Disp: , Rfl:    cinacalcet  (SENSIPAR ) 30 MG tablet, Take 30 mg by mouth daily., Disp: , Rfl:    CRANBERRY PO, Take 1 capsule by mouth daily., Disp: , Rfl:    Dulaglutide   (TRULICITY ) 3 MG/0.5ML SOAJ, Inject 3 mg into the skin once a week., Disp: 2 mL, Rfl: 4   ezetimibe  (ZETIA ) 10 MG tablet, Take 10 mg by mouth daily with supper., Disp: , Rfl:    ferric gluconate 250 mg in sodium chloride  0.9 % 250 mL, Inject 250 mg into the vein Every Tuesday,Thursday,and Saturday with dialysis., Disp: , Rfl:    gabapentin  (NEURONTIN ) 300 MG capsule, Take 1 capsule (300 mg total) by mouth at bedtime., Disp: 30 capsule, Rfl: 0   Lancets (ONETOUCH DELICA PLUS LANCET30G) MISC, , Disp: , Rfl:    levothyroxine  (SYNTHROID ) 112 MCG tablet, Take 1 tablet (112 mcg total) by mouth daily before breakfast., Disp: 30 tablet, Rfl: 0   metoprolol  succinate (TOPROL -XL) 50 MG 24 hr tablet, Take 1 tablet (50 mg total) by mouth at bedtime with or immediately following a meal, Disp: 30 tablet, Rfl: 3   midodrine  (PROAMATINE ) 10 MG tablet, Take 1 tablet (10 mg total) by mouth Every Tuesday,Thursday,and Saturday with dialysis., Disp: 30 tablet, Rfl: 0   ONETOUCH ULTRA test strip, , Disp: , Rfl:    polyethylene glycol powder (GLYCOLAX /MIRALAX ) 17 GM/SCOOP powder, Take 17 g by mouth as needed for mild constipation or moderate constipation., Disp: , Rfl:    VELPHORO 500 MG chewable tablet, Chew 1,000 mg by mouth 3 (three) times daily., Disp: , Rfl:    atorvastatin  (LIPITOR) 40 MG tablet, Take 1 tablet (40 mg total) by mouth daily., Disp: 90 tablet, Rfl: 3   ezetimibe  (ZETIA ) 10 MG tablet, Take 1 tablet (10 mg total) by mouth daily., Disp: 90 tablet, Rfl: 3   fluticasone -salmeterol (ADVAIR DISKUS) 100-50 MCG/ACT AEPB, Inhale 1 puff into the lungs 2 (two) times daily., Disp: 60 each, Rfl: 6   melatonin 5 MG TABS, Take 2 tablets (10 mg total) by mouth at bedtime as needed (sleep)., Disp: 60 tablet, Rfl: 0   metoprolol  succinate (TOPROL -XL) 50 MG 24 hr tablet, Take 1 tablet (50 mg total) by mouth at bedtime. Take with or immediately following a meal., Disp: 90 tablet, Rfl: 2   midodrine  (PROAMATINE ) 10 MG  tablet, Take 2 tablets (20 mg total) by mouth with each dialysis treatment, Disp:  120 tablet, Rfl: 4   multivitamin (RENA-VIT) TABS tablet, Take 1 tablet by mouth daily., Disp: , Rfl:    TRULICITY  1.5 MG/0.5ML SOPN, Inject 1.5 mg into the skin every Monday. (Patient not taking: Reported on 01/09/2024), Disp: , Rfl:   Physical exam:  Vitals:   01/14/24 1142  BP: 125/62  Pulse: 98  Resp: 19  Temp: 98.8 F (37.1 C)  TempSrc: Tympanic  SpO2: 96%  Weight: (!) 354 lb (160.6 kg)  Height: 5' 1 (1.549 m)   Physical Exam Constitutional:      Appearance: She is obese.     Comments: Ambulates with a cane.  Appears in no acute distress  Cardiovascular:     Rate and Rhythm: Normal rate and regular rhythm.     Heart sounds: Normal heart sounds.  Pulmonary:     Effort: Pulmonary effort is normal.     Breath sounds: Normal breath sounds.  Abdominal:     General: Bowel sounds are normal.     Palpations: Abdomen is soft.  Skin:    General: Skin is warm and dry.  Neurological:     Mental Status: She is alert and oriented to person, place, and time.      I have personally reviewed labs listed below:    Latest Ref Rng & Units 07/14/2023    8:45 AM  CMP  Glucose 70 - 99 mg/dL 830   BUN 6 - 20 mg/dL 65   Creatinine 9.55 - 1.00 mg/dL 1.93   Sodium 864 - 854 mmol/L 131   Potassium 3.5 - 5.1 mmol/L 4.2   Chloride 98 - 111 mmol/L 93   CO2 22 - 32 mmol/L 24   Calcium  8.9 - 10.3 mg/dL 8.3       Latest Ref Rng & Units 01/14/2024   11:19 AM  CBC  WBC 4.0 - 10.5 K/uL 8.7   Hemoglobin 12.0 - 15.0 g/dL 87.4   Hematocrit 63.9 - 46.0 % 39.7   Platelets 150 - 400 K/uL 152    I have personally reviewed Radiology images listed below: No images are attached to the encounter.  PERIPHERAL VASCULAR CATHETERIZATION Result Date: 01/09/2024 See surgical note for result.  VAS US  DUPLEX DIALYSIS ACCESS (AVF, AVG) Result Date: 12/21/2023 DIALYSIS ACCESS Patient Name:  JAHNAI SLINGERLAND  Date of Exam:    12/19/2023 Medical Rec #: [id number]       Accession #:    [id number] Date of Birth: [birth year]        Patient Gender: F Patient Age:   42 years Exam Location:  [facility] Procedure:      VAS US  DUPLEX DIALYSIS ACCESS (AVF, AVG) Referring Phys: ORVIN DARING --------------------------------------------------------------------------------  Access Site: Left Upper Extremity. Access Type: Brachial-cephalic AVF. History: 07/22/2021: Radial Cephalic AVF created.           12/09/2021 : Lt Brachial Cephalic AVF created.           07/06/2023: PTA and Stent placementPeriperal segment in 2 locations.          PTA and Stent placement Central Venous portion. Ultrasound guidance          ccess to the Left Arm BrachioCephalic Fistula in both the Antegrade and          retrograde directions.           04/28/2022: Revision of Left Brachial Cephalic AVF.           06/27/2022: AV Fistulogram.  01/05/2023: PTA peripheral segment in 3 locations Left Brachial          Cephalic AVF. Comparison Study: 08/15/2023 Performing Technologist: Leafy Gibes RVS  Examination Guidelines: A complete evaluation includes B-mode imaging, spectral Doppler, color Doppler, and power Doppler as needed of all accessible portions of each vessel. Unilateral testing is considered an integral part of a complete examination. Limited examinations for reoccurring indications may be performed as noted.  Findings: +--------------------+----------+-----------------+--------+ AVF                 PSV (cm/s)Flow Vol (mL/min)Comments +--------------------+----------+-----------------+--------+ Native artery inflow   396          1122                +--------------------+----------+-----------------+--------+ AVF Anastomosis        394                              +--------------------+----------+-----------------+--------+  +---------------+----------+-------------+----------+--------+ OUTFLOW VEIN   PSV (cm/s)Diameter (cm)Depth  (cm)Describe +---------------+----------+-------------+----------+--------+ Subclavian vein    92                                    +---------------+----------+-------------+----------+--------+ Confluence         79                                    +---------------+----------+-------------+----------+--------+ Shoulder           76                                    +---------------+----------+-------------+----------+--------+ Prox UA            73                                    +---------------+----------+-------------+----------+--------+ Mid UA             56                                    +---------------+----------+-------------+----------+--------+ Dist UA           426                                    +---------------+----------+-------------+----------+--------+  +--------------+-------------+---------+---------+---------+-------------------+               Diameter (cm)  Depth  Branching   PSV       Flow Volume                                  (cm)             (cm/s)       (ml/min)       +--------------+-------------+---------+---------+---------+-------------------+ Lt Rad Art  101                       Dist                                                                      +--------------+-------------+---------+---------+---------+-------------------+  Summary: The Left Brachial Cephalic AVF appears to be patent throughout; Flow Volume appears to be Normal.  *See table(s) above for measurements and observations.  Diagnosing physician: Cordella Shawl MD Electronically signed by Cordella Shawl MD on 12/21/2023 at 8:49:21 AM.   --------------------------------------------------------------------------------   Final      Assessment and plan- Patient is a 58 y.o. female here for routine follow-up of following issues:  History of pulmonary embolism: This was diagnosed in 2016 when she  was on tamoxifen  for breast cancer.  Hypercoagulable workup was negative.  Given her history of A-fib as well as obesity she has remained on lifelong anticoagulation which she will continue.  History of breast cancer: She completed 5 years of endocrine therapy in June 2020 for stage I right breast cancer.  Clinically she is doing well with no concerning signs and symptoms of recurrence based on today's exam.  Mammogram from October 2025 was unremarkable.  I will see her back in 1 year no labs   Visit Diagnosis 1. Current use of long term anticoagulation   2. Encounter for follow-up surveillance of breast cancer      Dr. Annah Skene, MD, MPH [facility] [phone] 01/14/2024 12:36 PM

## 2024-01-15 ENCOUNTER — Ambulatory Visit: Payer: BC Managed Care – PPO | Admitting: Oncology

## 2024-01-15 ENCOUNTER — Other Ambulatory Visit: Payer: Self-pay

## 2024-01-15 ENCOUNTER — Other Ambulatory Visit: Payer: BC Managed Care – PPO

## 2024-01-15 ENCOUNTER — Other Ambulatory Visit (HOSPITAL_COMMUNITY): Payer: Self-pay

## 2024-01-15 MED ORDER — RENA-VITE PO TABS
1.0000 | ORAL_TABLET | Freq: Every day | ORAL | 3 refills | Status: AC
  Filled 2024-01-15: qty 30, 30d supply, fill #0
  Filled 2024-02-25: qty 30, 30d supply, fill #1

## 2024-01-15 MED ORDER — GABAPENTIN 300 MG PO CAPS
300.0000 mg | ORAL_CAPSULE | Freq: Every day | ORAL | 3 refills | Status: AC
  Filled 2024-01-15: qty 30, 30d supply, fill #0
  Filled 2024-02-11 – 2024-02-12 (×2): qty 30, 30d supply, fill #1
  Filled 2024-02-25: qty 90, 90d supply, fill #2
  Filled 2024-03-09: qty 30, 30d supply, fill #2

## 2024-01-16 DIAGNOSIS — E118 Type 2 diabetes mellitus with unspecified complications: Secondary | ICD-10-CM | POA: Diagnosis not present

## 2024-01-16 DIAGNOSIS — E559 Vitamin D deficiency, unspecified: Secondary | ICD-10-CM | POA: Diagnosis not present

## 2024-01-22 ENCOUNTER — Other Ambulatory Visit (HOSPITAL_COMMUNITY): Payer: Self-pay

## 2024-02-04 ENCOUNTER — Other Ambulatory Visit (INDEPENDENT_AMBULATORY_CARE_PROVIDER_SITE_OTHER): Payer: Self-pay | Admitting: Vascular Surgery

## 2024-02-04 DIAGNOSIS — N186 End stage renal disease: Secondary | ICD-10-CM

## 2024-02-11 ENCOUNTER — Ambulatory Visit (INDEPENDENT_AMBULATORY_CARE_PROVIDER_SITE_OTHER): Admitting: Vascular Surgery

## 2024-02-11 ENCOUNTER — Ambulatory Visit (INDEPENDENT_AMBULATORY_CARE_PROVIDER_SITE_OTHER)

## 2024-02-11 ENCOUNTER — Encounter (INDEPENDENT_AMBULATORY_CARE_PROVIDER_SITE_OTHER): Payer: Self-pay | Admitting: Vascular Surgery

## 2024-02-11 ENCOUNTER — Other Ambulatory Visit (HOSPITAL_COMMUNITY): Payer: Self-pay

## 2024-02-11 VITALS — BP 167/82 | HR 106 | Resp 18 | Ht 61.0 in | Wt 349.6 lb

## 2024-02-11 DIAGNOSIS — J9611 Chronic respiratory failure with hypoxia: Secondary | ICD-10-CM | POA: Diagnosis not present

## 2024-02-11 DIAGNOSIS — I482 Chronic atrial fibrillation, unspecified: Secondary | ICD-10-CM | POA: Diagnosis not present

## 2024-02-11 DIAGNOSIS — N186 End stage renal disease: Secondary | ICD-10-CM

## 2024-02-11 DIAGNOSIS — I10 Essential (primary) hypertension: Secondary | ICD-10-CM | POA: Diagnosis not present

## 2024-02-13 ENCOUNTER — Other Ambulatory Visit (HOSPITAL_COMMUNITY): Payer: Self-pay

## 2024-02-20 ENCOUNTER — Other Ambulatory Visit (HOSPITAL_COMMUNITY): Payer: Self-pay

## 2024-02-20 ENCOUNTER — Encounter: Payer: Self-pay | Admitting: Cardiovascular Disease

## 2024-02-20 ENCOUNTER — Ambulatory Visit: Attending: Cardiovascular Disease | Admitting: Cardiovascular Disease

## 2024-02-20 VITALS — BP 120/64 | HR 92 | Ht 67.0 in | Wt 348.2 lb

## 2024-02-20 DIAGNOSIS — I4821 Permanent atrial fibrillation: Secondary | ICD-10-CM

## 2024-02-20 DIAGNOSIS — I700 Atherosclerosis of aorta: Secondary | ICD-10-CM

## 2024-02-20 DIAGNOSIS — E785 Hyperlipidemia, unspecified: Secondary | ICD-10-CM

## 2024-02-20 DIAGNOSIS — N186 End stage renal disease: Secondary | ICD-10-CM | POA: Diagnosis not present

## 2024-02-20 DIAGNOSIS — I5032 Chronic diastolic (congestive) heart failure: Secondary | ICD-10-CM

## 2024-02-20 DIAGNOSIS — I10 Essential (primary) hypertension: Secondary | ICD-10-CM | POA: Diagnosis not present

## 2024-02-20 NOTE — Patient Instructions (Signed)
 Medication Instructions:  No changes *If you need a refill on your cardiac medications before your next appointment, please call your pharmacy*  Lab Work: None ordered If you have labs (blood work) drawn today and your tests are completely normal, you will receive your results only by: MyChart Message (if you have MyChart) OR A paper copy in the mail If you have any lab test that is abnormal or we need to change your treatment, we will call you to review the results.  Testing/Procedures: None ordered  Follow-Up: At [facility], you and your health needs are our priority.  As part of our continuing mission to provide you with exceptional heart care, our providers are all part of one team.  This team includes your primary Cardiologist (physician) and Advanced Practice Providers or APPs (Physician Assistants and Nurse Practitioners) who all work together to provide you with the care you need, when you need it.  Your next appointment:   6 month(s)  Provider:   You may see Lorine Bears, MD or one of the following Advanced Practice Providers on your designated Care Team:   Nicolasa Ducking, NP Ames Dura, PA-C Eula Listen, PA-C Cadence McGrath, PA-C Charlsie Quest, NP Carlos Levering, NP    We recommend signing up for the patient portal called "MyChart".  Sign up information is provided on this After Visit Summary.  MyChart is used to connect with patients for Virtual Visits (Telemedicine).  Patients are able to view lab/test results, encounter notes, upcoming appointments, etc.  Non-urgent messages can be sent to your provider as well.   To learn more about what you can do with MyChart, go to [email or web address].

## 2024-02-20 NOTE — Progress Notes (Signed)
 "    Cardiology Office Note   Date:  02/20/2024   ID:  Nicole Schmidt, DOB [birth year], MRN [id number]  PCP:  Epifanio Alm SQUIBB, MD  Cardiologist:   Deatrice Cage, MD   Chief Complaint  Patient presents with   Follow-up    6 month f/u no complaints today. Meds reviewed verbally with pt.      History of Present Illness: Nicole Schmidt is a 59 y.o. female who presents for for follow-up visit regarding permanent atrial fibrillation and chronic diastolic heart failure. She has known history of breast cancer status post radiation, diabetes mellitus, essential hypertension, end-stage renal disease on hemodialysis, thyroid  disease, morbid obesity, sleep apnea on CPAP, anemia of chronic disease, gout, pulmonary embolism and chronic diastolic heart failure. She is now on dialysis on TTS schedule.   She was most recently seen in our office in July by Medford Meager.  At that time, ventricular rate was high and thus clonidine  was stopped and Toprol  was increased.  She reports improvement since then.  She only has to take 1 midodrine  on days of dialysis.  No chest pain or shortness of breath.  Past Medical History:  Diagnosis Date   (HFpEF) heart failure with preserved ejection fraction (HCC)    a. 10/2018 Echo: EF 60-65%, diast dysfxn, RVSP 50.7 mmHg; b. 01/2020 Echo: EF 65-70%, G2DD; c. 01/2021 Echo: EF 60-65%, no rwma, mod LVH. Nl RV fxn, mildly enlarged RV. Mod BAE. Triv MR. d. 05/2021 Echo: EF 55-60; no RWMAs, mild LVH, mild TR; e. 06/2023 Echo: EF 55-60%, no rwma, GrI DD, mod elev PASP, mildly dil LA, no signif valvular dzs.   Acquired hypothyroidism 02/09/2020   Adenoma of left adrenal gland    Anemia    Aortic atherosclerosis    Arthritis    Breast cancer of upper-inner quadrant of right female breast (HCC)    Right breast invasive CA and DCIS , 7 mm T1,N0,M0. Er/PR pos, her 2 negative.  Margins 1 mm.   COPD (chronic obstructive pulmonary disease) (HCC)    Diabetes mellitus type 2 in obese  02/09/2020   Difficult intubation 09/28/2021   a.) mallampati III-IV; BMI >55 kg/m   Diverticulosis    Dyslipidemia 02/09/2020   ESBL (extended spectrum beta-lactamase) producing bacteria infection 03/20/2021   a. urine culture (+) for ESBL producing E.coli and P. mirabilis   ESRD (end stage renal disease) on dialysis (HCC)    a. T-Th-Sat HD initiated 02/2021   Essential hypertension    Gout    History of kidney stones    Long term current use of anticoagulant    a.) apixaban    Menopause    age 48   Morbid obesity (HCC)    OSA treated with BiPAP    a. on nocturnal PAP therapy; Trilogy 100 ventilator   Permanent atrial fibrillation (HCC)    a. Dx 10/2018 in setting of PNA. b. CHA2DS2-VASc = 7 (sex, HFpEF, HTN, PE x2, aortic plaque, T2DM); c. s/p successful DCCV 11/2018. d. subsequent recurrent Afib; rate/rhythm maintained on oral metoprolol  succinate; chronically anticoagulated using standard dose apixaban .   Personal history of radiation therapy    Pulmonary embolism (HCC) 10/2014   a. on chronic apixaban    Restrictive lung disease    Shingles 10/24/2021   Sleep difficulties    a.) takes melatonin   Thyroid  goiter     Past Surgical History:  Procedure Laterality Date   A/V FISTULAGRAM Left 09/02/2021   Procedure: A/V Fistulagram;  Surgeon: Jama Cordella MATSU, MD;  Location: [facility] INVASIVE CV LAB;  Service: Cardiovascular;  Laterality: Left;   A/V FISTULAGRAM Left 06/27/2022   Procedure: A/V Fistulagram;  Surgeon: Jama Cordella MATSU, MD;  Location: [facility] INVASIVE CV LAB;  Service: Cardiovascular;  Laterality: Left;   A/V FISTULAGRAM Left 01/05/2023   Procedure: A/V Fistulagram;  Surgeon: Jama Cordella MATSU, MD;  Location: [facility] INVASIVE CV LAB;  Service: Cardiovascular;  Laterality: Left;   A/V FISTULAGRAM Left 07/06/2023   Procedure: A/V Fistulagram;  Surgeon: Jama Cordella MATSU, MD;  Location: [facility] INVASIVE CV LAB;  Service: Cardiovascular;  Laterality: Left;   A/V FISTULAGRAM  Left 01/09/2024   Procedure: A/V Fistulagram;  Surgeon: Jama Cordella MATSU, MD;  Location: [facility] INVASIVE CV LAB;  Service: Cardiovascular;  Laterality: Left;   AV FISTULA PLACEMENT Left 07/22/2021   Procedure: ARTERIOVENOUS (AV) FISTULA CREATION (RADIALCEPHALIC);  Surgeon: Jama Cordella MATSU, MD;  Location: [facility] ORS;  Service: Vascular;  Laterality: Left;   AV FISTULA PLACEMENT Left 12/09/2021   Procedure: ARTERIOVENOUS (AV) FISTULA CREATION (BRACHIAL CEPHALIC);  Surgeon: Jama Cordella MATSU, MD;  Location: [facility] ORS;  Service: Vascular;  Laterality: Left;   BREAST BIOPSY Right 2014   breast ca   BREAST EXCISIONAL BIOPSY Right 07/19/2012   breast ca   BREAST SURGERY Right 2014   with sentinel node bx subareaolar duct excision   CARDIOVERSION N/A 11/28/2018   Procedure: CARDIOVERSION;  Surgeon: Perla Evalene PARAS, MD;  Location: [facility] ORS;  Service: Cardiovascular;  Laterality: N/A;   COLONOSCOPY WITH PROPOFOL  N/A 01/30/2017   Procedure: COLONOSCOPY WITH PROPOFOL ;  Surgeon: Dellie Louanne MATSU, MD;  Location: [facility] ENDOSCOPY;  Service: Endoscopy;  Laterality: N/A;   CYSTOSCOPY W/ RETROGRADES  06/17/2021   Procedure: CYSTOSCOPY WITH RETROGRADE PYELOGRAM;  Surgeon: Francisca Redell BROCKS, MD;  Location: [facility] ORS;  Service: Urology;;   CYSTOSCOPY W/ URETERAL STENT PLACEMENT Left 02/14/2021   Procedure: CYSTOSCOPY WITH RETROGRADE PYELOGRAM/URETERAL STENT PLACEMENT;  Surgeon: Francisca Redell BROCKS, MD;  Location: [facility] ORS;  Service: Urology;  Laterality: Left;   CYSTOSCOPY/URETEROSCOPY/HOLMIUM LASER/STENT PLACEMENT Left 06/17/2021   Procedure: CYSTOSCOPY/URETEROSCOPY/HOLMIUM LASER/STENT EXCHANGE;  Surgeon: Francisca Redell BROCKS, MD;  Location: [facility] ORS;  Service: Urology;  Laterality: Left;   DIALYSIS/PERMA CATHETER INSERTION N/A 02/24/2021   Procedure: DIALYSIS/PERMA CATHETER INSERTION;  Surgeon: Marea Selinda RAMAN, MD;  Location: [facility] INVASIVE CV LAB;  Service: Cardiovascular;  Laterality: N/A;   DIALYSIS/PERMA CATHETER  INSERTION N/A 12/16/2021   Procedure: DIALYSIS/PERMA CATHETER INSERTION;  Surgeon: Jama Cordella MATSU, MD;  Location: [facility] INVASIVE CV LAB;  Service: Cardiovascular;  Laterality: N/A;   DIALYSIS/PERMA CATHETER REMOVAL N/A 09/04/2022   Procedure: DIALYSIS/PERMA CATHETER REMOVAL;  Surgeon: Marea Selinda RAMAN, MD;  Location: [facility] INVASIVE CV LAB;  Service: Cardiovascular;  Laterality: N/A;   PERIPHERAL VASCULAR THROMBECTOMY Left 09/28/2021   Procedure: PERIPHERAL VASCULAR THROMBECTOMY;  Surgeon: Jama Cordella MATSU, MD;  Location: [facility] INVASIVE CV LAB;  Service: Cardiovascular;  Laterality: Left;   REVISON OF ARTERIOVENOUS FISTULA Left 04/28/2022   Procedure: REVISON OF ARTERIOVENOUS FISTULA (BRACHIAL CEPHALIC);  Surgeon: Jama Cordella MATSU, MD;  Location: [facility] ORS;  Service: Vascular;  Laterality: Left;     Current Outpatient Medications  Medication Sig Dispense Refill   acetaminophen  (TYLENOL ) 325 MG tablet Take 2 tablets (650 mg total) by mouth every 6 (six) hours as needed for mild pain (or Fever >/= 101).     albuterol  (VENTOLIN  HFA) 108 (90 Base) MCG/ACT inhaler Inhale 2 puffs into the lungs every 6 (six) hours as needed for  wheezing or shortness of breath. 8 g 2   apixaban  (ELIQUIS ) 5 MG TABS tablet Take 1 tablet (5 mg total) by mouth 2 (two) times daily. 180 tablet 1   atorvastatin  (LIPITOR) 40 MG tablet Take 40 mg by mouth daily.     cinacalcet  (SENSIPAR ) 30 MG tablet Take 30 mg by mouth daily.     CRANBERRY PO Take 1 capsule by mouth daily.     Dulaglutide  (TRULICITY ) 3 MG/0.5ML SOAJ Inject 3 mg into the skin once a week. 2 mL 4   ezetimibe  (ZETIA ) 10 MG tablet Take 10 mg by mouth daily with supper.     ferric gluconate 250 mg in sodium chloride  0.9 % 250 mL Inject 250 mg into the vein Every Tuesday,Thursday,and Saturday with dialysis.     fluticasone -salmeterol (ADVAIR DISKUS) 100-50 MCG/ACT AEPB Inhale 1 puff into the lungs 2 (two) times daily. 60 each 6   gabapentin  (NEURONTIN ) 300 MG capsule  Take 1 capsule (300 mg total) by mouth at bedtime. 30 capsule 0   Lancets (ONETOUCH DELICA PLUS LANCET30G) MISC      levothyroxine  (SYNTHROID ) 112 MCG tablet Take 1 tablet (112 mcg total) by mouth daily before breakfast. 30 tablet 0   melatonin 5 MG TABS Take 2 tablets (10 mg total) by mouth at bedtime as needed (sleep). 60 tablet 0   metoprolol  succinate (TOPROL -XL) 50 MG 24 hr tablet Take 1 tablet (50 mg total) by mouth at bedtime with or immediately following a meal 30 tablet 3   midodrine  (PROAMATINE ) 10 MG tablet Take 1 tablet (10 mg total) by mouth Every Tuesday,Thursday,and Saturday with dialysis. 30 tablet 0   multivitamin (RENA-VIT) TABS tablet Take 1 tablet by mouth daily.     ONETOUCH ULTRA test strip      polyethylene glycol powder (GLYCOLAX /MIRALAX ) 17 GM/SCOOP powder Take 17 g by mouth as needed for mild constipation or moderate constipation.     VELPHORO 500 MG chewable tablet Chew 1,000 mg by mouth 3 (three) times daily.     albuterol  (VENTOLIN  HFA) 108 (90 Base) MCG/ACT inhaler Inhale 2 puffs into the lungs every 6 (six) hours as needed for wheezing or shortness of breath. (Patient not taking: Reported on 02/20/2024) 6.7 g 2   atorvastatin  (LIPITOR) 40 MG tablet Take 1 tablet (40 mg total) by mouth daily. (Patient not taking: Reported on 02/20/2024) 90 tablet 3   ezetimibe  (ZETIA ) 10 MG tablet Take 1 tablet (10 mg total) by mouth daily. (Patient not taking: Reported on 02/20/2024) 90 tablet 3   gabapentin  (NEURONTIN ) 300 MG capsule Take 1 capsule (300 mg total) by mouth at bedtime. (Patient not taking: Reported on 02/20/2024) 90 capsule 3   metoprolol  succinate (TOPROL -XL) 50 MG 24 hr tablet Take 1 tablet (50 mg total) by mouth at bedtime. Take with or immediately following a meal. (Patient not taking: Reported on 02/20/2024) 90 tablet 2   midodrine  (PROAMATINE ) 10 MG tablet Take 2 tablets (20 mg total) by mouth with each dialysis treatment (Patient not taking: Reported on 02/20/2024) 120 tablet  4   multivitamin (RENA-VIT) TABS tablet Take 1 tablet by mouth once daily (Patient not taking: Reported on 02/20/2024) 30 tablet 3   TRULICITY  1.5 MG/0.5ML SOPN Inject 1.5 mg into the skin every Monday. (Patient not taking: Reported on 02/20/2024)     No current facility-administered medications for this visit.    Allergies:   Spironolactone     Social History:  The patient  reports that she has never  smoked. She has never been exposed to tobacco smoke. She has never used smokeless tobacco. She reports that she does not drink alcohol and does not use drugs.   Family History:  The patient's family history includes Atrial fibrillation in her mother; Colon cancer (age of onset: 22) in her cousin; Diabetes in her father; Heart attack in her mother; Heart failure in her mother; Hypertension in her father and mother; Parkinson's disease in her father; Rheum arthritis in her mother.    ROS:  Please see the history of present illness.   Otherwise, review of systems are positive for none.   All other systems are reviewed and negative.    PHYSICAL EXAM: VS:  BP 120/64 (BP Location: Right Wrist, Patient Position: Sitting, Cuff Size: Large)   Pulse 92   Ht 5' 7 (1.702 m)   Wt (!) 348 lb 4 oz (158 kg)   LMP 07/17/2012   SpO2 94%   BMI 54.54 kg/m  , BMI Body mass index is 54.54 kg/m. GEN: Well nourished, well developed, in no acute distress  HEENT: normal  Neck: no JVD, carotid bruits, or masses Cardiac: Irregularly irregular; no murmurs, rubs, or gallops, mild edema  Respiratory:  clear to auscultation bilaterally, normal work of breathing GI: soft, nontender, nondistended, + BS MS: no deformity or atrophy  Skin: warm and dry, no rash Neuro:  Strength and sensation are intact Psych: euthymic mood, full affect   EKG:  EKG is ordered today. The ekg ordered today demonstrates:  Atrial fibrillation Low voltage QRS Septal infarct (cited on or before 15-Aug-2023)    Recent Labs: 03/02/2023:  ALT 31 07/10/2023: B Natriuretic Peptide 221.3 01/14/2024: BUN 36; Creatinine, Ser 7.49; Hemoglobin 12.5; Platelets 152; Potassium 3.7; Sodium 138    Lipid Panel    Component Value Date/Time   CHOL 140 03/02/2023 1054   TRIG 225 (H) 03/02/2023 1054   HDL 36 (L) 03/02/2023 1054   CHOLHDL 3.9 03/02/2023 1054   CHOLHDL 2.5 02/09/2020 1340   VLDL 31 02/09/2020 1340   LDLCALC 67 03/02/2023 1054      Wt Readings from Last 3 Encounters:  02/20/24 (!) 348 lb 4 oz (158 kg)  02/11/24 (!) 349 lb 9.6 oz (158.6 kg)  01/14/24 (!) 354 lb (160.6 kg)            No data to display            ASSESSMENT AND PLAN:  1.  Permanent atrial fibrillation: Ventricular rate is better controlled after increasing Toprol  to 50 mg once daily.  Continue anticoagulation with Eliquis  5 mg twice daily.    2. Chronic diastolic heart failure: She appears to be euvolemic.  Fluid status is managed by dialysis.  3.  Essential hypertension: Blood pressure is controlled on Toprol .  She only has to take 1 midodrine  on the day of dialysis.  4. Morbid obesity: She is trying to lose weight but has not been successful.  5. History of pulmonary embolism: On anticoagulation with Eliquis .  6.  Obstructive sleep apnea: On CPAP  7.  End-stage renal disease on hemodialysis managed by nephrology    Disposition:   FU with me in 6 months  Signed,  Deatrice Cage, MD  02/20/2024 11:50 AM    [facility] "

## 2024-02-23 ENCOUNTER — Encounter (INDEPENDENT_AMBULATORY_CARE_PROVIDER_SITE_OTHER): Payer: Self-pay | Admitting: Vascular Surgery

## 2024-02-23 NOTE — Progress Notes (Signed)
 "                     MRN : [id number]  Nicole Schmidt is a 59 y.o. (1965-10-04) female who presents with chief complaint of check access.  History of Present Illness:   The patient returns to the office for followup status post intervention of their dialysis access on 01/09/2024.   Procedure: Percutaneous transluminal angioplasty of the peripheral segment in 2 locations. Percutaneous transluminal angioplasty of the central segment in 1 location.  Following the intervention the access function has significantly improved, with better flow rates and improved KT/V. The patient has not been experiencing increased bleeding times following decannulation and the patient denies increased recirculation. The patient denies an increase in arm swelling. At the present time the patient denies hand pain.  No recent shortening of the patient's walking distance or new symptoms consistent with claudication.  No history of rest pain symptoms. No new ulcers or wounds of the lower extremities have occurred.  The patient denies amaurosis fugax or recent TIA symptoms. There are no recent neurological changes noted. There is no history of DVT, PE or superficial thrombophlebitis. No recent episodes of angina or shortness of breath documented.   Duplex ultrasound of the AV access shows a patent access.  The previously noted stenosis is improved compared to last study.  Flow volume today is 1333 cc/min (previous flow volume was 1122 cc/min)     Active Medications[1]  Past Medical History:  Diagnosis Date   (HFpEF) heart failure with preserved ejection fraction (HCC)    a. 10/2018 Echo: EF 60-65%, diast dysfxn, RVSP 50.7 mmHg; b. 01/2020 Echo: EF 65-70%, G2DD; c. 01/2021 Echo: EF 60-65%, no rwma, mod LVH. Nl RV fxn, mildly enlarged RV. Mod BAE. Triv MR. d. 05/2021 Echo: EF 55-60; no RWMAs, mild LVH, mild TR; e. 06/2023 Echo: EF 55-60%, no rwma, GrI DD, mod elev PASP, mildly dil LA, no signif valvular dzs.    Acquired hypothyroidism 02/09/2020   Adenoma of left adrenal gland    Anemia    Aortic atherosclerosis    Arthritis    Breast cancer of upper-inner quadrant of right female breast (HCC)    Right breast invasive CA and DCIS , 7 mm T1,N0,M0. Er/PR pos, her 2 negative.  Margins 1 mm.   COPD (chronic obstructive pulmonary disease) (HCC)    Diabetes mellitus type 2 in obese 02/09/2020   Difficult intubation 09/28/2021   a.) mallampati III-IV; BMI >55 kg/m   Diverticulosis    Dyslipidemia 02/09/2020   ESBL (extended spectrum beta-lactamase) producing bacteria infection 03/20/2021   a. urine culture (+) for ESBL producing E.coli and P. mirabilis   ESRD (end stage renal disease) on dialysis (HCC)    a. T-Th-Sat HD initiated 02/2021   Essential hypertension    Gout    History of kidney stones    Long term current use of anticoagulant    a.) apixaban    Menopause    age 87   Morbid obesity (HCC)    OSA treated with BiPAP    a. on nocturnal PAP therapy; Trilogy 100 ventilator   Permanent atrial fibrillation (HCC)    a. Dx 10/2018 in setting of PNA. b. CHA2DS2-VASc = 7 (sex, HFpEF, HTN, PE x2, aortic plaque, T2DM); c. s/p successful DCCV 11/2018. d. subsequent recurrent Afib; rate/rhythm maintained on oral metoprolol  succinate; chronically anticoagulated using standard dose apixaban .   Personal history of radiation therapy    Pulmonary embolism (HCC)  10/2014   a. on chronic apixaban    Restrictive lung disease    Shingles 10/24/2021   Sleep difficulties    a.) takes melatonin   Thyroid  goiter     Past Surgical History:  Procedure Laterality Date   A/V FISTULAGRAM Left 09/02/2021   Procedure: A/V Fistulagram;  Surgeon: Jama Cordella MATSU, MD;  Location: [facility];  Service: Cardiovascular;  Laterality: Left;   A/V FISTULAGRAM Left 06/27/2022   Procedure: A/V Fistulagram;  Surgeon: Jama Cordella MATSU, MD;  Location: [facility];  Service: Cardiovascular;  Laterality:  Left;   A/V FISTULAGRAM Left 01/05/2023   Procedure: A/V Fistulagram;  Surgeon: Jama Cordella MATSU, MD;  Location: [facility];  Service: Cardiovascular;  Laterality: Left;   A/V FISTULAGRAM Left 07/06/2023   Procedure: A/V Fistulagram;  Surgeon: Jama Cordella MATSU, MD;  Location: [facility];  Service: Cardiovascular;  Laterality: Left;   A/V FISTULAGRAM Left 01/09/2024   Procedure: A/V Fistulagram;  Surgeon: Jama Cordella MATSU, MD;  Location: [facility];  Service: Cardiovascular;  Laterality: Left;   AV FISTULA PLACEMENT Left 07/22/2021   Procedure: ARTERIOVENOUS (AV) FISTULA CREATION (RADIALCEPHALIC);  Surgeon: Jama Cordella MATSU, MD;  Location: [facility];  Service: Vascular;  Laterality: Left;   AV FISTULA PLACEMENT Left 12/09/2021   Procedure: ARTERIOVENOUS (AV) FISTULA CREATION (BRACHIAL CEPHALIC);  Surgeon: Jama Cordella MATSU, MD;  Location: [facility];  Service: Vascular;  Laterality: Left;   BREAST BIOPSY Right 2014   breast ca   BREAST EXCISIONAL BIOPSY Right 07/19/2012   breast ca   BREAST SURGERY Right 2014   with sentinel node bx subareaolar duct excision   CARDIOVERSION N/A 11/28/2018   Procedure: CARDIOVERSION;  Surgeon: Perla Evalene PARAS, MD;  Location: [facility];  Service: Cardiovascular;  Laterality: N/A;   COLONOSCOPY WITH PROPOFOL  N/A 01/30/2017   Procedure: COLONOSCOPY WITH PROPOFOL ;  Surgeon: Dellie Louanne MATSU, MD;  Location: [facility];  Service: Endoscopy;  Laterality: N/A;   CYSTOSCOPY W/ RETROGRADES  06/17/2021   Procedure: CYSTOSCOPY WITH RETROGRADE PYELOGRAM;  Surgeon: Francisca Redell BROCKS, MD;  Location: [facility];  Service: Urology;;   CYSTOSCOPY W/ URETERAL STENT PLACEMENT Left 02/14/2021   Procedure: CYSTOSCOPY WITH RETROGRADE PYELOGRAM/URETERAL STENT PLACEMENT;  Surgeon: Francisca Redell BROCKS, MD;  Location: [facility];  Service: Urology;  Laterality: Left;   CYSTOSCOPY/URETEROSCOPY/HOLMIUM LASER/STENT PLACEMENT Left 06/17/2021   Procedure:  CYSTOSCOPY/URETEROSCOPY/HOLMIUM LASER/STENT EXCHANGE;  Surgeon: Francisca Redell BROCKS, MD;  Location: [facility];  Service: Urology;  Laterality: Left;   DIALYSIS/PERMA CATHETER INSERTION N/A 02/24/2021   Procedure: DIALYSIS/PERMA CATHETER INSERTION;  Surgeon: Marea Selinda RAMAN, MD;  Location: [facility];  Service: Cardiovascular;  Laterality: N/A;   DIALYSIS/PERMA CATHETER INSERTION N/A 12/16/2021   Procedure: DIALYSIS/PERMA CATHETER INSERTION;  Surgeon: Jama Cordella MATSU, MD;  Location: [facility];  Service: Cardiovascular;  Laterality: N/A;   DIALYSIS/PERMA CATHETER REMOVAL N/A 09/04/2022   Procedure: DIALYSIS/PERMA CATHETER REMOVAL;  Surgeon: Marea Selinda RAMAN, MD;  Location: [facility];  Service: Cardiovascular;  Laterality: N/A;   PERIPHERAL VASCULAR THROMBECTOMY Left 09/28/2021   Procedure: PERIPHERAL VASCULAR THROMBECTOMY;  Surgeon: Jama Cordella MATSU, MD;  Location: [facility];  Service: Cardiovascular;  Laterality: Left;   REVISON OF ARTERIOVENOUS FISTULA Left 04/28/2022   Procedure: REVISON OF ARTERIOVENOUS FISTULA (BRACHIAL CEPHALIC);  Surgeon: Jama Cordella MATSU, MD;  Location: [facility];  Service: Vascular;  Laterality: Left;    Social History Social History[2]  Family History Family  History  Problem Relation Age of Onset   Diabetes Father    Hypertension Father    Parkinson's disease Father    Hypertension Mother    Rheum arthritis Mother    Atrial fibrillation Mother    Heart failure Mother    Heart attack Mother        died in her mid-70's.   Colon cancer Cousin 54   Breast cancer Neg Hx     Allergies[3]   REVIEW OF SYSTEMS (Negative unless checked)  Constitutional: [] Weight loss  [] Fever  [] Chills Cardiac: [] Chest pain   [] Chest pressure   [] Palpitations   [] Shortness of breath when laying flat   [] Shortness of breath with exertion. Vascular:  [] Pain in legs with walking   [] Pain in legs at rest  [] History of DVT   [] Phlebitis   [] Swelling in  legs   [] Varicose veins   [] Non-healing ulcers Pulmonary:   [] Uses home oxygen    [] Productive cough   [] Hemoptysis   [] Wheeze  [] COPD   [] Asthma Neurologic:  [] Dizziness   [] Seizures   [] History of stroke   [] History of TIA  [] Aphasia   [] Vissual changes   [] Weakness or numbness in arm   [] Weakness or numbness in leg Musculoskeletal:   [] Joint swelling   [] Joint pain   [] Low back pain Hematologic:  [] Easy bruising  [] Easy bleeding   [] Hypercoagulable state   [] Anemic Gastrointestinal:  [] Diarrhea   [] Vomiting  [] Gastroesophageal reflux/heartburn   [] Difficulty swallowing. Genitourinary:  [x] Chronic kidney disease   [] Difficult urination  [] Frequent urination   [] Blood in urine Skin:  [] Rashes   [] Ulcers  Psychological:  [] History of anxiety   []  History of major depression.  Physical Examination  Vitals:   02/11/24 1414  BP: (!) 167/82  Pulse: (!) 106  Resp: 18  Weight: (!) 349 lb 9.6 oz (158.6 kg)  Height: 5' 1 (1.549 m)   Body mass index is 66.06 kg/m. Gen: WD/WN, NAD Head: [place]/AT, No temporalis wasting.  Ear/Nose/Throat: Hearing grossly intact, nares w/o erythema or drainage Eyes: PER, EOMI, sclera nonicteric.  Neck: Supple, no gross masses or lesions.  No JVD.  Pulmonary:  Good air movement, no audible wheezing, no use of accessory muscles.  Cardiac: RRR, precordium non-hyperdynamic. Vascular:   Left upper arm AV access good thrill good bruit Vessel Right Left  Radial Palpable Palpable  Brachial Palpable Palpable  Gastrointestinal: soft, non-distended. No guarding/no peritoneal signs.  Musculoskeletal: M/S 5/5 throughout.  No deformity.  Neurologic: CN 2-12 intact. Pain and light touch intact in extremities.  Symmetrical.  Speech is fluent. Motor exam as listed above. Psychiatric: Judgment intact, Mood & affect appropriate for pt's clinical situation. Dermatologic: No rashes or ulcers noted.  No changes consistent with cellulitis.   CBC Lab Results  Component Value Date    WBC 8.7 01/14/2024   HGB 12.5 01/14/2024   HCT 39.7 01/14/2024   MCV 115.4 (H) 01/14/2024   PLT 152 01/14/2024    BMET    Component Value Date/Time   NA 138 01/14/2024 1119   NA 144 02/27/2020 0931   NA 141 03/09/2014 1533   K 3.7 01/14/2024 1119   K 4.2 03/09/2014 1533   CL 97 (L) 01/14/2024 1119   CL 101 03/09/2014 1533   CO2 28 01/14/2024 1119   CO2 30 03/09/2014 1533   GLUCOSE 158 (H) 01/14/2024 1119   GLUCOSE 95 03/09/2014 1533   BUN 36 (H) 01/14/2024 1119   BUN 40 (H) 02/27/2020 9068  BUN 35 (H) 03/09/2014 1533   CREATININE 7.49 (HH) 01/14/2024 1119   CREATININE 1.50 (H) 03/09/2014 1533   CALCIUM  9.1 01/14/2024 1119   CALCIUM  8.8 03/09/2014 1533   GFRNONAA 6 (L) 01/14/2024 1119   GFRNONAA 39 (L) 03/09/2014 1533   GFRNONAA 55 (L) 07/31/2013 1521   GFRAA 35 (L) 02/27/2020 0931   GFRAA 47 (L) 03/09/2014 1533   GFRAA >60 07/31/2013 1521   CrCl cannot be calculated (Patient's most recent lab result is older than the maximum 21 days allowed.).  COAG Lab Results  Component Value Date   INR 1.1 02/15/2021   INR 1.35 01/12/2018   INR 1.27 11/16/2017    Radiology VAS US  DUPLEX DIALYSIS ACCESS (AVF, AVG) Result Date: 02/13/2024 DIALYSIS ACCESS Patient Name:  Nicole Schmidt  Date of Exam:   02/11/2024 Medical Rec #: [id number]       Accession #:    [id number] Date of Birth: [birth year]        Patient Gender: F Patient Age:   11 years Exam Location:  [facility] Procedure:      VAS US  DUPLEX DIALYSIS ACCESS (AVF, AVG) Referring Phys: CORDELLA SHAWL --------------------------------------------------------------------------------  Reason for Exam: Routine follow up. Access Site: Left Upper Extremity. Access Type: Brachial-cephalic AVF. History: 01/09/2024 1. Percutaneous transluminal angioplasty of the peripheral          segment in 2 locations.          Percutaneous transluminal angioplasty of the central segment in 1          location.           07/22/2021:  Radial Cephalic AVF created.           12/09/2021 : Lt Brachial Cephalic AVF created.           07/06/2023: PTA and Stent placementPeriperal segment in 2 locations.          PTA and Stent placement Central Venous portion. Ultrasound guidance          ccess to the Left Arm BrachioCephalic Fistula in both the Antegrade and          retrograde directions.           04/28/2022: Revision of Left Brachial Cephalic AVF.           06/27/2022: AV Fistulogram.           01/05/2023: PTA peripheral segment in 3 locations Left Brachial          Cephalic AVF. Comparison Study: 12/19/2023 Performing Technologist: Jerel Croak RVT  Examination Guidelines: A complete evaluation includes B-mode imaging, spectral Doppler, color Doppler, and power Doppler as needed of all accessible portions of each vessel. Unilateral testing is considered an integral part of a complete examination. Limited examinations for reoccurring indications may be performed as noted.  Findings: +--------------------+----------+-----------------+--------+ AVF                 PSV (cm/s)Flow Vol (mL/min)Comments +--------------------+----------+-----------------+--------+ Native artery inflow   329          1333                +--------------------+----------+-----------------+--------+ AVF Anastomosis        301                              +--------------------+----------+-----------------+--------+  +-------------+----------+-------------+----------+--------+ OUTFLOW VEIN PSV (cm/s)Diameter (cm)Depth (cm)Describe +-------------+----------+-------------+----------+--------+ Axillary vein   120                                    +-------------+----------+-------------+----------+--------+  [place]      109                                    +-------------+----------+-------------+----------+--------+ Shoulder        103                                    +-------------+----------+-------------+----------+--------+ Prox UA          124                            stent   +-------------+----------+-------------+----------+--------+ Mid UA           44        1.43                        +-------------+----------+-------------+----------+--------+ Dist UA         156                                    +-------------+----------+-------------+----------+--------+  Summary: Patent arteriovenous fistula. *See table(s) above for measurements and observations.  Diagnosing physician: Cordella Shawl MD Electronically signed by Cordella Shawl MD on 02/13/2024 at 7:56:43 AM.  --------------------------------------------------------------------------------   Final      Assessment/Plan 1. ESRD (end stage renal disease) (HCC) (Primary) Recommend:  The patient is doing well and currently has adequate dialysis access. The patient's dialysis center is not reporting any access issues. Flow pattern is stable when compared to the prior ultrasound.  The patient should have a duplex ultrasound of the dialysis access in 6 months. The patient will follow-up with me in the office after each ultrasound   - VAS US  DUPLEX DIALYSIS ACCESS (AVF, AVG); Future  2. Essential hypertension Continue antihypertensive medications as already ordered, these medications have been reviewed and there are no changes at this time.  3. Atrial fibrillation, chronic (HCC) Continue antiarrhythmia medications as already ordered, these medications have been reviewed and there are no changes at this time.  Continue anticoagulation as ordered by Cardiology Service  4. Chronic respiratory failure with hypoxia (HCC) Continue pulmonary medications and aerosols as already ordered, these medications have been reviewed and there are no changes at this time.     Cordella Shawl, MD  02/23/2024 11:54 AM      [1]  Current Meds  Medication Sig   acetaminophen  (TYLENOL ) 325 MG tablet Take 2 tablets (650 mg total) by mouth every 6 (six) hours as  needed for mild pain (or Fever >/= 101).   albuterol  (VENTOLIN  HFA) 108 (90 Base) MCG/ACT inhaler Inhale 2 puffs into the lungs every 6 (six) hours as needed for wheezing or shortness of breath.   albuterol  (VENTOLIN  HFA) 108 (90 Base) MCG/ACT inhaler Inhale 2 puffs into the lungs every 6 (six) hours as needed for wheezing or shortness of breath. (Patient not taking: Reported on 02/20/2024)   apixaban  (ELIQUIS ) 5 MG TABS tablet Take 1 tablet (5 mg total) by mouth 2 (two) times daily.   atorvastatin  (LIPITOR) 40 MG tablet Take 40 mg by mouth daily.   atorvastatin  (LIPITOR) 40 MG tablet Take 1 tablet (40 mg total) by mouth daily. (Patient not taking: Reported on 02/20/2024)   cinacalcet  (SENSIPAR )  30 MG tablet Take 30 mg by mouth daily.   CRANBERRY PO Take 1 capsule by mouth daily.   Dulaglutide  (TRULICITY ) 3 MG/0.5ML SOAJ Inject 3 mg into the skin once a week.   ezetimibe  (ZETIA ) 10 MG tablet Take 10 mg by mouth daily with supper.   ezetimibe  (ZETIA ) 10 MG tablet Take 1 tablet (10 mg total) by mouth daily. (Patient not taking: Reported on 02/20/2024)   ferric gluconate 250 mg in sodium chloride  0.9 % 250 mL Inject 250 mg into the vein Every Tuesday,Thursday,and Saturday with dialysis.   fluticasone -salmeterol (ADVAIR DISKUS) 100-50 MCG/ACT AEPB Inhale 1 puff into the lungs 2 (two) times daily.   gabapentin  (NEURONTIN ) 300 MG capsule Take 1 capsule (300 mg total) by mouth at bedtime.   gabapentin  (NEURONTIN ) 300 MG capsule Take 1 capsule (300 mg total) by mouth at bedtime. (Patient not taking: Reported on 02/20/2024)   Lancets (ONETOUCH DELICA PLUS LANCET30G) MISC    levothyroxine  (SYNTHROID ) 112 MCG tablet Take 1 tablet (112 mcg total) by mouth daily before breakfast.   melatonin 5 MG TABS Take 2 tablets (10 mg total) by mouth at bedtime as needed (sleep).   metoprolol  succinate (TOPROL -XL) 50 MG 24 hr tablet Take 1 tablet (50 mg total) by mouth at bedtime with or immediately following a meal   metoprolol   succinate (TOPROL -XL) 50 MG 24 hr tablet Take 1 tablet (50 mg total) by mouth at bedtime. Take with or immediately following a meal. (Patient not taking: Reported on 02/20/2024)   midodrine  (PROAMATINE ) 10 MG tablet Take 1 tablet (10 mg total) by mouth Every Tuesday,Thursday,and Saturday with dialysis.   midodrine  (PROAMATINE ) 10 MG tablet Take 2 tablets (20 mg total) by mouth with each dialysis treatment (Patient not taking: Reported on 02/20/2024)   multivitamin (RENA-VIT) TABS tablet Take 1 tablet by mouth daily.   multivitamin (RENA-VIT) TABS tablet Take 1 tablet by mouth once daily (Patient not taking: Reported on 02/20/2024)   ONETOUCH ULTRA test strip    polyethylene glycol powder (GLYCOLAX /MIRALAX ) 17 GM/SCOOP powder Take 17 g by mouth as needed for mild constipation or moderate constipation.   VELPHORO 500 MG chewable tablet Chew 1,000 mg by mouth 3 (three) times daily.  [2]  Social History Tobacco Use   Smoking status: Never    Passive exposure: Never   Smokeless tobacco: Never  Vaping Use   Vaping status: Never Used  Substance Use Topics   Alcohol use: No    Alcohol/week: 0.0 standard drinks of alcohol   Drug use: No  [3]  Allergies Allergen Reactions   Spironolactone      Hyperkalemia  Pt unaware of this allergy   "

## 2024-02-25 ENCOUNTER — Other Ambulatory Visit (HOSPITAL_COMMUNITY): Payer: Self-pay

## 2024-02-25 ENCOUNTER — Other Ambulatory Visit: Payer: Self-pay

## 2024-02-25 MED ORDER — TRULICITY 3 MG/0.5ML ~~LOC~~ SOAJ
3.0000 mg | SUBCUTANEOUS | 4 refills | Status: AC
  Filled 2024-02-25 – 2024-02-26 (×2): qty 6, 84d supply, fill #0

## 2024-02-26 ENCOUNTER — Other Ambulatory Visit (HOSPITAL_COMMUNITY): Payer: Self-pay

## 2024-02-26 ENCOUNTER — Other Ambulatory Visit: Payer: Self-pay

## 2024-02-27 ENCOUNTER — Other Ambulatory Visit: Payer: Self-pay

## 2024-02-28 ENCOUNTER — Other Ambulatory Visit: Payer: Self-pay

## 2024-02-29 ENCOUNTER — Other Ambulatory Visit: Payer: Self-pay

## 2024-03-09 ENCOUNTER — Other Ambulatory Visit (HOSPITAL_COMMUNITY): Payer: Self-pay

## 2024-03-09 MED ORDER — RENA-VITE PO TABS
1.0000 | ORAL_TABLET | Freq: Every day | ORAL | 3 refills | Status: AC
  Filled 2024-03-09: qty 100, 100d supply, fill #0

## 2024-03-10 ENCOUNTER — Other Ambulatory Visit (HOSPITAL_COMMUNITY): Payer: Self-pay

## 2024-03-11 ENCOUNTER — Other Ambulatory Visit (HOSPITAL_COMMUNITY): Payer: Self-pay

## 2024-03-11 ENCOUNTER — Other Ambulatory Visit: Payer: Self-pay

## 2024-05-26 ENCOUNTER — Ambulatory Visit: Admitting: Internal Medicine

## 2024-08-11 ENCOUNTER — Ambulatory Visit (INDEPENDENT_AMBULATORY_CARE_PROVIDER_SITE_OTHER): Admitting: Vascular Surgery

## 2024-08-11 ENCOUNTER — Encounter (INDEPENDENT_AMBULATORY_CARE_PROVIDER_SITE_OTHER)

## 2025-01-12 ENCOUNTER — Inpatient Hospital Stay: Admitting: Oncology
